# Patient Record
Sex: Male | Born: 1940
Health system: Southern US, Community
[De-identification: ages and names within clinical notes are randomized; demographics above are authoritative.]

## PROBLEM LIST (undated history)

## (undated) DIAGNOSIS — K219 Gastro-esophageal reflux disease without esophagitis: Secondary | ICD-10-CM

## (undated) DIAGNOSIS — E7800 Pure hypercholesterolemia, unspecified: Secondary | ICD-10-CM

## (undated) DIAGNOSIS — G250 Essential tremor: Secondary | ICD-10-CM

## (undated) DIAGNOSIS — I509 Heart failure, unspecified: Secondary | ICD-10-CM

## (undated) DIAGNOSIS — S82872A Displaced pilon fracture of left tibia, initial encounter for closed fracture: Secondary | ICD-10-CM

## (undated) DIAGNOSIS — R011 Cardiac murmur, unspecified: Secondary | ICD-10-CM

## (undated) DIAGNOSIS — I499 Cardiac arrhythmia, unspecified: Secondary | ICD-10-CM

## (undated) DIAGNOSIS — T7840XA Allergy, unspecified, initial encounter: Secondary | ICD-10-CM

## (undated) DIAGNOSIS — M1990 Unspecified osteoarthritis, unspecified site: Secondary | ICD-10-CM

## (undated) DIAGNOSIS — C801 Malignant (primary) neoplasm, unspecified: Secondary | ICD-10-CM

## (undated) DIAGNOSIS — I6529 Occlusion and stenosis of unspecified carotid artery: Secondary | ICD-10-CM

## (undated) DIAGNOSIS — M4802 Spinal stenosis, cervical region: Secondary | ICD-10-CM

## (undated) DIAGNOSIS — I219 Acute myocardial infarction, unspecified: Secondary | ICD-10-CM

## (undated) DIAGNOSIS — I2510 Atherosclerotic heart disease of native coronary artery without angina pectoris: Secondary | ICD-10-CM

## (undated) DIAGNOSIS — G4733 Obstructive sleep apnea (adult) (pediatric): Secondary | ICD-10-CM

## (undated) DIAGNOSIS — I350 Nonrheumatic aortic (valve) stenosis: Secondary | ICD-10-CM

## (undated) DIAGNOSIS — G4730 Sleep apnea, unspecified: Secondary | ICD-10-CM

## (undated) DIAGNOSIS — E119 Type 2 diabetes mellitus without complications: Secondary | ICD-10-CM

## (undated) DIAGNOSIS — E039 Hypothyroidism, unspecified: Secondary | ICD-10-CM

## (undated) DIAGNOSIS — K5730 Diverticulosis of large intestine without perforation or abscess without bleeding: Secondary | ICD-10-CM

## (undated) DIAGNOSIS — R519 Headache, unspecified: Secondary | ICD-10-CM

## (undated) DIAGNOSIS — N200 Calculus of kidney: Secondary | ICD-10-CM

## (undated) DIAGNOSIS — Z9289 Personal history of other medical treatment: Secondary | ICD-10-CM

## (undated) DIAGNOSIS — R51 Headache: Secondary | ICD-10-CM

## (undated) DIAGNOSIS — M109 Gout, unspecified: Secondary | ICD-10-CM

## (undated) DIAGNOSIS — Z860100 Personal history of colon polyps, unspecified: Secondary | ICD-10-CM

## (undated) DIAGNOSIS — T8859XA Other complications of anesthesia, initial encounter: Secondary | ICD-10-CM

## (undated) DIAGNOSIS — I10 Essential (primary) hypertension: Secondary | ICD-10-CM

## (undated) DIAGNOSIS — F419 Anxiety disorder, unspecified: Secondary | ICD-10-CM

## (undated) DIAGNOSIS — R0600 Dyspnea, unspecified: Secondary | ICD-10-CM

## (undated) DIAGNOSIS — J189 Pneumonia, unspecified organism: Secondary | ICD-10-CM

## (undated) DIAGNOSIS — Z87442 Personal history of urinary calculi: Secondary | ICD-10-CM

## (undated) DIAGNOSIS — Z8489 Family history of other specified conditions: Secondary | ICD-10-CM

## (undated) DIAGNOSIS — F4310 Post-traumatic stress disorder, unspecified: Secondary | ICD-10-CM

## (undated) DIAGNOSIS — F32A Depression, unspecified: Secondary | ICD-10-CM

## (undated) DIAGNOSIS — E8889 Other specified metabolic disorders: Secondary | ICD-10-CM

## (undated) DIAGNOSIS — T4145XA Adverse effect of unspecified anesthetic, initial encounter: Secondary | ICD-10-CM

## (undated) DIAGNOSIS — Z9989 Dependence on other enabling machines and devices: Secondary | ICD-10-CM

## (undated) HISTORY — DX: Spinal stenosis, cervical region: M48.02

## (undated) HISTORY — DX: Sleep apnea, unspecified: G47.30

## (undated) HISTORY — DX: Other specified metabolic disorders: E88.89

## (undated) HISTORY — DX: Obstructive sleep apnea (adult) (pediatric): G47.33

## (undated) HISTORY — PX: TONSILLECTOMY: SUR1361

## (undated) HISTORY — PX: JOINT REPLACEMENT: SHX530

## (undated) HISTORY — PX: HEMORRHOID BANDING: SHX5850

## (undated) HISTORY — DX: Allergy, unspecified, initial encounter: T78.40XA

## (undated) HISTORY — DX: Diverticulosis of large intestine without perforation or abscess without bleeding: K57.30

## (undated) HISTORY — DX: Personal history of colonic polyps: Z86.010

## (undated) HISTORY — DX: Personal history of colon polyps, unspecified: Z86.0100

## (undated) HISTORY — PX: CARDIAC VALVE REPLACEMENT: SHX585

## (undated) HISTORY — PX: COLONOSCOPY W/ POLYPECTOMY: SHX1380

## (undated) HISTORY — PX: TOE FUSION: SHX1070

## (undated) HISTORY — DX: Occlusion and stenosis of unspecified carotid artery: I65.29

## (undated) HISTORY — DX: Essential tremor: G25.0

## (undated) HISTORY — DX: Gout, unspecified: M10.9

## (undated) HISTORY — DX: Essential (primary) hypertension: I10

## (undated) HISTORY — PX: BACK SURGERY: SHX140

## (undated) HISTORY — DX: Obstructive sleep apnea (adult) (pediatric): Z99.89

---
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
## 1984-06-06 HISTORY — PX: KIDNEY STONE SURGERY: SHX686

## 1997-09-17 ENCOUNTER — Other Ambulatory Visit: Admission: RE | Admit: 1997-09-17 | Discharge: 1997-09-17 | Payer: Self-pay | Admitting: Dermatology

## 2000-06-06 HISTORY — PX: ANTERIOR FUSION CERVICAL SPINE: SUR626

## 2001-01-22 ENCOUNTER — Encounter: Payer: Self-pay | Admitting: Specialist

## 2001-01-23 ENCOUNTER — Observation Stay (HOSPITAL_COMMUNITY): Admission: RE | Admit: 2001-01-23 | Discharge: 2001-01-24 | Payer: Self-pay | Admitting: Specialist

## 2001-01-23 ENCOUNTER — Encounter (INDEPENDENT_AMBULATORY_CARE_PROVIDER_SITE_OTHER): Payer: Self-pay

## 2001-01-23 ENCOUNTER — Encounter: Payer: Self-pay | Admitting: Specialist

## 2001-01-31 ENCOUNTER — Encounter: Payer: Self-pay | Admitting: Specialist

## 2001-01-31 ENCOUNTER — Emergency Department (HOSPITAL_COMMUNITY): Admission: EM | Admit: 2001-01-31 | Discharge: 2001-01-31 | Payer: Self-pay | Admitting: Emergency Medicine

## 2001-04-09 ENCOUNTER — Encounter: Admission: RE | Admit: 2001-04-09 | Discharge: 2001-04-09 | Payer: Self-pay | Admitting: Specialist

## 2001-04-09 ENCOUNTER — Encounter: Payer: Self-pay | Admitting: Specialist

## 2001-05-04 ENCOUNTER — Encounter: Payer: Self-pay | Admitting: General Surgery

## 2001-05-04 ENCOUNTER — Inpatient Hospital Stay (HOSPITAL_COMMUNITY): Admission: EM | Admit: 2001-05-04 | Discharge: 2001-05-08 | Payer: Self-pay

## 2001-07-18 ENCOUNTER — Encounter: Payer: Self-pay | Admitting: Neurosurgery

## 2001-07-18 ENCOUNTER — Inpatient Hospital Stay (HOSPITAL_COMMUNITY): Admission: RE | Admit: 2001-07-18 | Discharge: 2001-07-19 | Payer: Self-pay | Admitting: Neurosurgery

## 2001-11-02 ENCOUNTER — Ambulatory Visit: Admission: RE | Admit: 2001-11-02 | Discharge: 2001-11-02 | Payer: Self-pay | Admitting: Internal Medicine

## 2002-01-08 ENCOUNTER — Observation Stay (HOSPITAL_COMMUNITY): Admission: RE | Admit: 2002-01-08 | Discharge: 2002-01-08 | Payer: Self-pay | Admitting: Orthopedic Surgery

## 2002-05-24 ENCOUNTER — Ambulatory Visit (HOSPITAL_BASED_OUTPATIENT_CLINIC_OR_DEPARTMENT_OTHER): Admission: RE | Admit: 2002-05-24 | Discharge: 2002-05-24 | Payer: Self-pay | Admitting: Internal Medicine

## 2002-08-08 ENCOUNTER — Ambulatory Visit (HOSPITAL_BASED_OUTPATIENT_CLINIC_OR_DEPARTMENT_OTHER): Admission: RE | Admit: 2002-08-08 | Discharge: 2002-08-08 | Payer: Self-pay | Admitting: Pulmonary Disease

## 2002-11-26 ENCOUNTER — Encounter: Payer: Self-pay | Admitting: Orthopedic Surgery

## 2002-11-27 ENCOUNTER — Encounter: Payer: Self-pay | Admitting: Orthopedic Surgery

## 2002-11-27 ENCOUNTER — Ambulatory Visit (HOSPITAL_COMMUNITY): Admission: RE | Admit: 2002-11-27 | Discharge: 2002-11-27 | Payer: Self-pay | Admitting: Orthopedic Surgery

## 2003-09-30 ENCOUNTER — Ambulatory Visit (HOSPITAL_BASED_OUTPATIENT_CLINIC_OR_DEPARTMENT_OTHER): Admission: RE | Admit: 2003-09-30 | Discharge: 2003-09-30 | Payer: Self-pay | Admitting: Orthopedic Surgery

## 2003-09-30 ENCOUNTER — Ambulatory Visit (HOSPITAL_COMMUNITY): Admission: RE | Admit: 2003-09-30 | Discharge: 2003-09-30 | Payer: Self-pay | Admitting: Orthopedic Surgery

## 2004-06-06 HISTORY — PX: POSTERIOR FUSION CERVICAL SPINE: SUR628

## 2004-06-18 ENCOUNTER — Ambulatory Visit: Payer: Self-pay | Admitting: Internal Medicine

## 2004-06-21 ENCOUNTER — Ambulatory Visit: Payer: Self-pay | Admitting: Internal Medicine

## 2005-04-08 ENCOUNTER — Ambulatory Visit: Payer: Self-pay | Admitting: Internal Medicine

## 2005-04-20 ENCOUNTER — Ambulatory Visit: Payer: Self-pay | Admitting: Internal Medicine

## 2005-08-31 ENCOUNTER — Ambulatory Visit: Payer: Self-pay | Admitting: Pulmonary Disease

## 2005-10-06 ENCOUNTER — Ambulatory Visit: Payer: Self-pay | Admitting: Internal Medicine

## 2005-11-16 ENCOUNTER — Ambulatory Visit: Payer: Self-pay | Admitting: Internal Medicine

## 2006-08-15 ENCOUNTER — Ambulatory Visit: Payer: Self-pay | Admitting: Internal Medicine

## 2008-12-22 ENCOUNTER — Ambulatory Visit: Payer: Self-pay | Admitting: Internal Medicine

## 2008-12-22 ENCOUNTER — Inpatient Hospital Stay (HOSPITAL_COMMUNITY): Admission: EM | Admit: 2008-12-22 | Discharge: 2008-12-25 | Payer: Self-pay | Admitting: Emergency Medicine

## 2008-12-22 ENCOUNTER — Ambulatory Visit: Payer: Self-pay | Admitting: Cardiology

## 2008-12-22 ENCOUNTER — Ambulatory Visit: Payer: Self-pay | Admitting: Gastroenterology

## 2008-12-22 DIAGNOSIS — Z87442 Personal history of urinary calculi: Secondary | ICD-10-CM | POA: Insufficient documentation

## 2008-12-22 DIAGNOSIS — M109 Gout, unspecified: Secondary | ICD-10-CM | POA: Insufficient documentation

## 2008-12-22 DIAGNOSIS — K5730 Diverticulosis of large intestine without perforation or abscess without bleeding: Secondary | ICD-10-CM

## 2008-12-22 DIAGNOSIS — I10 Essential (primary) hypertension: Secondary | ICD-10-CM | POA: Insufficient documentation

## 2008-12-24 ENCOUNTER — Encounter: Payer: Self-pay | Admitting: Gastroenterology

## 2008-12-25 ENCOUNTER — Encounter: Payer: Self-pay | Admitting: Gastroenterology

## 2008-12-25 ENCOUNTER — Encounter: Payer: Self-pay | Admitting: Internal Medicine

## 2008-12-30 ENCOUNTER — Encounter: Payer: Self-pay | Admitting: Gastroenterology

## 2008-12-31 ENCOUNTER — Ambulatory Visit: Payer: Self-pay | Admitting: Internal Medicine

## 2008-12-31 DIAGNOSIS — Z860100 Personal history of colon polyps, unspecified: Secondary | ICD-10-CM | POA: Insufficient documentation

## 2009-01-01 ENCOUNTER — Ambulatory Visit: Payer: Self-pay | Admitting: Internal Medicine

## 2009-01-01 LAB — CONVERTED CEMR LAB
Calcium: 9.7 mg/dL (ref 8.4–10.5)
GFR calc non Af Amer: 53.89 mL/min (ref >60)
Glucose, Bld: 121 mg/dL — ABNORMAL HIGH (ref 70–99)
Sodium: 140 meq/L (ref 135–145)

## 2009-01-02 ENCOUNTER — Ambulatory Visit: Payer: Self-pay | Admitting: Internal Medicine

## 2009-01-09 ENCOUNTER — Encounter (INDEPENDENT_AMBULATORY_CARE_PROVIDER_SITE_OTHER): Payer: Self-pay | Admitting: *Deleted

## 2009-01-09 LAB — CONVERTED CEMR LAB
HDL: 30.2 mg/dL — ABNORMAL LOW (ref >39.00)
Hgb A1c MFr Bld: 6.1 % (ref 4.6–6.5)
Microalb Creat Ratio: 13.5 mg/g (ref 0.0–30.0)
Triglycerides: 199 mg/dL — ABNORMAL HIGH (ref 0.0–149.0)
Uric Acid, Serum: 7.8 mg/dL (ref 4.0–7.8)

## 2009-04-27 ENCOUNTER — Encounter: Payer: Self-pay | Admitting: Internal Medicine

## 2009-07-08 ENCOUNTER — Ambulatory Visit: Payer: Self-pay | Admitting: Internal Medicine

## 2009-07-08 DIAGNOSIS — E119 Type 2 diabetes mellitus without complications: Secondary | ICD-10-CM

## 2009-07-08 LAB — CONVERTED CEMR LAB
HDL: 38.9 mg/dL — ABNORMAL LOW (ref >39.00)
Hgb A1c MFr Bld: 6.6 % — ABNORMAL HIGH (ref 4.6–6.5)

## 2009-07-11 LAB — CONVERTED CEMR LAB
Creatinine, Ser: 1.4 mg/dL (ref 0.4–1.5)
Potassium: 4.5 meq/L (ref 3.5–5.1)

## 2009-07-13 ENCOUNTER — Telehealth (INDEPENDENT_AMBULATORY_CARE_PROVIDER_SITE_OTHER): Payer: Self-pay | Admitting: *Deleted

## 2010-07-06 NOTE — Progress Notes (Signed)
Summary: Lab Results  Phone Note Outgoing Call Call back at [facility] Phone [phone]   Call placed by: Shonna Chock,  July 13, 2009 3:50 PM Call placed to: Patient Summary of Call: Left message on machine with the information below  Good, kidney function is stable & normal (creatinine was 1.4 in 12/2008 also). Hopp Patient to call if any questions or concerns. Copy of labs mailed Chrae Malloy  July 13, 2009 3:51 PM

## 2010-07-06 NOTE — Assessment & Plan Note (Signed)
Summary: irregular heartbeats and dehydrated//lh   Vital Signs:  Patient profile:   70 year old male Weight:      262 pounds Temp:     98.9 degrees F oral Resp:     15 per minute BP sitting:   162 / 89  (left arm) Cuff size:   large  Vitals Entered By: Shonna Chock (July 08, 2009 4:45 PM) CC: Irregular Heartbeats and ? dehydrated. Patient began to cuss and said he is not doing an EKG, this entire visit is (cuss word). Patient was upset that Kidney function and potassium was not checked and I discuss the reason was it was not order based on labs form 12/2008 Comments REVIEWED MED LIST, PATIENT AGREED DOSE AND INSTRUCTION CORRECT    CC:  Irregular Heartbeats and ? dehydrated. Patient began to cuss and said he is not doing an EKG and this entire visit is (cuss word). Patient was upset that Kidney function and potassium was not checked and I discuss the reason was it was not order based on labs form 12/2008.  History of Present Illness: He questions dehydration due to difficult venous access despite > 2 liters of fluid/ day & edema. His major concern is recurrence of renal insufficiency with hyperkalemia which prompted last hospitalization. Labs drawn were based on 01/02/2009 abnormal values. He has been  seen @ [facility]  X 3  since 12/2008;  labs done pre Endo 11/2Esophagus, gastritis, duodenitis. He is transferring to [facility].Labs reviewed & risks discussed; A1c now in Diabetes range. See BP; BP 140/80 on average @ home.  Allergies: 1)  ! Valium 2)  ! Zoloft  Past History:  Past Surgical History: Toe surgery X 2 (1610,9604); Open retrival renal calculus on L; Cervical fusion,post Cervical laminectomy,ant Tonsillectomy Colon polypectomy Endoscopy 04/2009:Barrett's, Gastritis, Duodenitis  Review of Systems General:  Complains of fatigue and sleep disorder. ENT:  Denies difficulty swallowing and hoarseness. CV:  Complains of swelling of feet and swelling of hands; denies bluish  discoloration of lips or nails, chest pain or discomfort, difficulty breathing at night, difficulty breathing while lying down, leg cramps with exertion, lightheadness, near fainting, and shortness of breath with exertion. Resp:  Denies excessive snoring, hypersomnolence, and morning headaches; CPAP controls Sleep Apnea. GI:  Denies diarrhea. GU:  Denies discharge, dysuria, hematuria, incontinence, urinary frequency, and urinary hesitancy; Nocturia X 1. MS:  Complains of joint pain; denies joint redness and joint swelling; No recent gout ; Allopurinol restarted 1 month ago . pain in toes & hands, knees , L hip. Derm:  Denies poor wound healing. Neuro:  Complains of numbness and tingling; N& T and burning in feet. Endo:  Denies excessive hunger, excessive thirst, and excessive urination.  Physical Exam  General:  in no acute distress; alert,appropriate and cooperative throughout examination Neck:  No deformities, masses, or tenderness noted. Lungs:  Normal respiratory effort, chest expands symmetrically. Lungs are clear to auscultation, no crackles or wheezes. Heart:  normal rate, regular rhythm, no gallop, no rub, no JVD, no HJR, and grade1.5  /6 basialr systolic murmur.  Carotid bruits vs radiation Abdomen:  Bowel sounds positive,abdomen soft and non-tender without masses, organomegaly or hernias noted. Protuberant  Pulses:  R and L carotid,radial,dorsalis pedis and posterior tibial pulses are full and equal bilaterally Extremities:  No clubbing, cyanosis, edema, or deformity noted . Minor toenail fungal changes Neurologic:  alert & oriented X3 and sensation intact to light touch over feet.   Skin:  Intact without  suspicious lesions or rashes Psych:  Frustrated that renal panel had not been done.   Impression & Recommendations:  Problem # 1:  HYPERTENSION (ICD-401.9)  His updated medication list for this problem includes:    Furosemide 40 Mg Tabs (Furosemide) .[name]... 1 once daily     Metoprolol Tartrate 50 Mg Tabs (Metoprolol tartrate) .[name]... 1/2 -1 two times a day to maintain bp < 135/85 on average    Amlodipine Besylate 5 Mg Tabs (Amlodipine besylate) .[name]... 1/2 -1 once daily to maaintain bp < 135/85 on average  Problem # 2:  DIABETES MELLITUS (ICD-250.00)  Problem # 3:  GOUT (ICD-274.9) PMH of His updated medication list for this problem includes:    Allopurinol 300 Mg Tabs (Allopurinol) .[name]... 1 by mouth once daily  Problem # 4:  RENAL FAILURE (ICD-586) PMH of  Complete Medication List: 1)  Furosemide 40 Mg Tabs (Furosemide) .[name].. 1 once daily 2)  Metoprolol Tartrate 50 Mg Tabs (Metoprolol tartrate) .... 1/2 -1 two times a day to maintain bp < 135/85 on average 3)  Amlodipine Besylate 5 Mg Tabs (Amlodipine besylate) .... 1/2 -1 once daily to maaintain bp < 135/85 on average 4)  Omeprazole 20 Mg Tbec (Omeprazole) .[name].. 1 by mouth once daily 5)  Allopurinol 300 Mg Tabs (Allopurinol) .[name].. 1 by mouth once daily 6)  Dicyclomine Hcl 10 Mg Caps (Dicyclomine hcl) .... 2 by mouth qid as needed 7)  Folic Acid 800 Mcg Tabs (Folic acid) .[name].. 1 by mouth once daily 8)  Vitamin C 1000 Mg Tabs (Ascorbic acid) .[name].. 1 by mouth once daily 9)  Omega 3 Purified Fish Oil  .[name].. 1 tsp daily  Patient Instructions: 1)  Follow "Rules of 40": LESS THAN 40 grams of sugar / day from foods & drinks with High Fructose Corn Syrup as #1, 2 or #3 on label. 40 oz of water / day. Exercise 40 min 3 X/week. Aim for 40 inches. 2)  See your eye doctor yearly to check for diabetic eye damage. 3)  Check your feet each night for sore areas, calluses or signs of infection. 4)  Check your Blood Pressure regularly. If it is above:140/90 ON AVERAGE  you should  increase Amlodipine to two times a day  5)  Please schedule a follow-up appointment after  3 months of nutrition changes as above: 6)  BUN,creat, K+, uric acid,Lipid Panel,  7)  HbgA1C.

## 2010-07-06 NOTE — Procedures (Signed)
Summary: EGD/[facility]  EGD/[facility]   Imported By: Lanelle Bal 07/14/2009 09:01:12  _____________________________________________________________________  External Attachment:    Type:   Image     Comment:   External Document

## 2010-09-12 LAB — BASIC METABOLIC PANEL
BUN: 21 mg/dL (ref 6–23)
BUN: 27 mg/dL — ABNORMAL HIGH (ref 6–23)
BUN: 52 mg/dL — ABNORMAL HIGH (ref 6–23)
CO2: 13 mEq/L — ABNORMAL LOW (ref 19–32)
CO2: 16 mEq/L — ABNORMAL LOW (ref 19–32)
CO2: 19 mEq/L (ref 19–32)
CO2: 20 mEq/L (ref 19–32)
CO2: 27 mEq/L (ref 19–32)
CO2: 28 mEq/L (ref 19–32)
Calcium: 8.4 mg/dL (ref 8.4–10.5)
Calcium: 9.2 mg/dL (ref 8.4–10.5)
Calcium: 9.4 mg/dL (ref 8.4–10.5)
Calcium: 9.7 mg/dL (ref 8.4–10.5)
Chloride: 105 mEq/L (ref 96–112)
Chloride: 107 mEq/L (ref 96–112)
Chloride: 115 mEq/L — ABNORMAL HIGH (ref 96–112)
Chloride: 117 mEq/L — ABNORMAL HIGH (ref 96–112)
Creatinine, Ser: 1.56 mg/dL — ABNORMAL HIGH (ref 0.4–1.5)
Creatinine, Ser: 1.66 mg/dL — ABNORMAL HIGH (ref 0.4–1.5)
Creatinine, Ser: 2.56 mg/dL — ABNORMAL HIGH (ref 0.4–1.5)
GFR calc Af Amer: 27 mL/min — ABNORMAL LOW (ref >60)
GFR calc Af Amer: 39 mL/min — ABNORMAL LOW (ref >60)
Glucose, Bld: 126 mg/dL — ABNORMAL HIGH (ref 70–99)
Glucose, Bld: 135 mg/dL — ABNORMAL HIGH (ref 70–99)
Glucose, Bld: 140 mg/dL — ABNORMAL HIGH (ref 70–99)
Glucose, Bld: 146 mg/dL — ABNORMAL HIGH (ref 70–99)
Glucose, Bld: 94 mg/dL (ref 70–99)
Potassium: 4.7 mEq/L (ref 3.5–5.1)
Potassium: 4.8 mEq/L (ref 3.5–5.1)
Potassium: 5.1 mEq/L (ref 3.5–5.1)
Sodium: 136 mEq/L (ref 135–145)
Sodium: 137 mEq/L (ref 135–145)
Sodium: 138 mEq/L (ref 135–145)
Sodium: 140 mEq/L (ref 135–145)

## 2010-09-12 LAB — COMPREHENSIVE METABOLIC PANEL
AST: 17 U/L (ref 0–37)
Albumin: 3.7 g/dL (ref 3.5–5.2)
Alkaline Phosphatase: 31 U/L — ABNORMAL LOW (ref 39–117)
Alkaline Phosphatase: 37 U/L — ABNORMAL LOW (ref 39–117)
BUN: 21 mg/dL (ref 6–23)
CO2: 29 mEq/L (ref 19–32)
Chloride: 119 mEq/L — ABNORMAL HIGH (ref 96–112)
GFR calc Af Amer: 23 mL/min — ABNORMAL LOW (ref >60)
GFR calc non Af Amer: 42 mL/min — ABNORMAL LOW (ref >60)
Glucose, Bld: 106 mg/dL — ABNORMAL HIGH (ref 70–99)
Potassium: 3.7 mEq/L (ref 3.5–5.1)
Potassium: >7.5 mEq/L (ref 3.5–5.1)
Total Bilirubin: 1 mg/dL (ref 0.3–1.2)
Total Protein: 6.4 g/dL (ref 6.0–8.3)
Total Protein: 7 g/dL (ref 6.0–8.3)

## 2010-09-12 LAB — IRON AND TIBC
Iron: 54 ug/dL (ref 42–135)
UIBC: 181 ug/dL

## 2010-09-12 LAB — URINALYSIS, ROUTINE W REFLEX MICROSCOPIC
Bilirubin Urine: NEGATIVE
Ketones, ur: NEGATIVE mg/dL
Nitrite: NEGATIVE
Protein, ur: NEGATIVE mg/dL
Specific Gravity, Urine: 1.008 (ref 1.005–1.030)
Urobilinogen, UA: 0.2 mg/dL (ref 0.0–1.0)

## 2010-09-12 LAB — FECAL LACTOFERRIN, QUANT: Fecal Lactoferrin: NEGATIVE

## 2010-09-12 LAB — URINE CULTURE
Colony Count: NO GROWTH
Culture: NO GROWTH

## 2010-09-12 LAB — CBC
HCT: 32.4 % — ABNORMAL LOW (ref 39.0–52.0)
Hemoglobin: 11.1 g/dL — ABNORMAL LOW (ref 13.0–17.0)
MCHC: 34.1 g/dL (ref 30.0–36.0)
MCV: 92.2 fL (ref 78.0–100.0)
MCV: 93 fL (ref 78.0–100.0)
Platelets: 142 10*3/uL — ABNORMAL LOW (ref 150–400)
RBC: 3.48 MIL/uL — ABNORMAL LOW (ref 4.22–5.81)
RBC: 3.56 MIL/uL — ABNORMAL LOW (ref 4.22–5.81)
WBC: 7.8 10*3/uL (ref 4.0–10.5)
WBC: 9.2 10*3/uL (ref 4.0–10.5)

## 2010-09-12 LAB — CARDIAC PANEL(CRET KIN+CKTOT+MB+TROPI)
CK, MB: 4.4 ng/mL — ABNORMAL HIGH (ref 0.3–4.0)
CK, MB: 4.5 ng/mL — ABNORMAL HIGH (ref 0.3–4.0)
Relative Index: 1.2 (ref 0.0–2.5)
Total CK: 384 U/L — ABNORMAL HIGH (ref 7–232)

## 2010-09-12 LAB — URINE MICROSCOPIC-ADD ON

## 2010-09-12 LAB — POCT CARDIAC MARKERS
CKMB, poc: 1.8 ng/mL (ref 1.0–8.0)
Myoglobin, poc: 492 ng/mL (ref 12–200)
Myoglobin, poc: >500 ng/mL (ref 12–200)
Troponin i, poc: <0.05 ng/mL (ref 0.00–0.09)

## 2010-09-12 LAB — GIARDIA/CRYPTOSPORIDIUM SCREEN(EIA)

## 2010-09-12 LAB — GLUCOSE, CAPILLARY: Glucose-Capillary: 101 mg/dL — ABNORMAL HIGH (ref 70–99)

## 2010-09-12 LAB — DIFFERENTIAL
Basophils Absolute: 0 10*3/uL (ref 0.0–0.1)
Basophils Relative: 0 % (ref 0–1)
Eosinophils Relative: 1 % (ref 0–5)
Lymphocytes Relative: 17 % (ref 12–46)
Monocytes Absolute: 0.7 10*3/uL (ref 0.1–1.0)
Monocytes Relative: 9 % (ref 3–12)

## 2010-09-12 LAB — CLOSTRIDIUM DIFFICILE EIA: C difficile Toxins A+B, EIA: NEGATIVE

## 2010-09-12 LAB — AMYLASE: Amylase: 79 U/L (ref 27–131)

## 2010-09-12 LAB — FERRITIN: Ferritin: 731 ng/mL — ABNORMAL HIGH (ref 22–322)

## 2010-09-12 LAB — CK TOTAL AND CKMB (NOT AT ARMC): Total CK: 267 U/L — ABNORMAL HIGH (ref 7–232)

## 2010-09-12 LAB — RETICULOCYTES: RBC.: 3.69 MIL/uL — ABNORMAL LOW (ref 4.22–5.81)

## 2010-09-12 LAB — POTASSIUM: Potassium: >7.5 mEq/L (ref 3.5–5.1)

## 2010-10-19 NOTE — Discharge Summary (Signed)
Troy Banks, Troy Banks            ACCOUNT NO.:  [id number]   MEDICAL RECORD NO.:  [id number]          PATIENT TYPE:  INP   LOCATION:  4739                         FACILITY:  [facility]   PHYSICIAN:  Rosalyn Gess. Norins, MD  DATE OF BIRTH:  04-19-1941   DATE OF ADMISSION:  12/22/2008  DATE OF DISCHARGE:  12/25/2008                               DISCHARGE SUMMARY   ADMITTING DIAGNOSES:  1. Acute abdominal pain.  2. Chest pain.   DISCHARGE DIAGNOSES:  1. Diarrhea of unknown etiology.  2. Chest pain, resolved.  3. Acute renal failure, resolved.  4. Acute severe hyperglycemia, resolved.  5. Abdominal bloating and distention with no working diagnosis.   CONSULTANTS:  Barbette Hair. Arlyce Dice, MD, [facility] for the GI Service.   PROCEDURES:  CT scan of the abdomen which showed no acute abnormal  findings except for scattered diverticulosis.  He did have  nonobstructing right renal calculi.  CT of pelvis was negative except  for diverticulosis.  Acute abdominal series at the time of admission  with a bronchitic changes.  Nonobstructing calculi noted.  No acute  abdominal findings noted.  Colonoscopy which revealed that the patient  have a polyp that was removed.  Pathology is pending.   HISTORY OF PRESENT ILLNESS:  The patient is a very pleasant 70 year old  gentleman who has been followed by Dr. Marga Melnick, although not seen  for several years.  He has been followed by the [facility].  He was  evaluated and being treated with lisinopril at the [facility] for over  a year.  The patient presented to Dr. Frederik Pear office complaining of  loose stools and lower abdominal pain for 3 weeks.  He had early satiety  and his weight had dropped 20 pounds and actually 40 pounds over the  last several months.  He had dull mid abdominal pain and discomfort,  sharp pain in bilateral lower quadrants.  He had food sensitivities.  Because of the patient's abdominal pain and discomfort, he was admitted  to  hospital.  Of note, he denied any fever, dysuria, jaundice, or dark  urine and his abdominal pain was relieved with antacids.   The patient did report exertional chest pain, shortness of breath,  palpitations, dizziness, and lightheadedness.  He denied any resting  chest pain, had no diaphoresis, or syncope.  Pain was in the substernal  region and was relieved with antacids.   PAST MEDICAL HISTORY:  1. ADHD with manic depression.  2. Diverticulosis.  3. Gout.  4. Hypertension.  5. Nephrolithiasis.   PAST SURGICAL HISTORY:  1. Toe surgery x2.  2. Open retrieval of renal calculus on the left.  3. Cervical fusion in the past as well as an anterior cervical      laminectomy and tonsillectomy.   MEDICATIONS AT ADMISSION:  1. Spironolactone 25 mg b.i.d.  2. Cardura 8 mg daily.  3. Benazepril and hydrochlorothiazide b.i.d.  4. Allopurinol 300 mg daily.  5. Lopressor 50 mg daily.   HOSPITAL COURSE:  1. Renal.  The patient was admitted and had acute renal failure with a  creatinine of 3.47.  The patient also had acute hyperkalemia with a      potassium of 7.5.  He was treated with Kayexalate, calcium, and      insulin.  After multiple treatments, the patient's potassium did      return down to a normal range.  He never demonstrated any      significant EKG abnormalities with this.  With adequate hydration,      the patient's renal function did return to a more reasonable level,      although with chronic renal insufficiency with a creatinine of      1.64.  This has been a stable value.  Do not have prior outpatient      laboratory for comparison.  2. Diarrhea.  The patient with a frequent stooling and possible cause      of his dehydration and acute renal failure.  Stool cultures were      pending at time of discharge.  Stool was negative for C. diff x2.      The patient was seen in consultation by the GI Service.  He did      come to colonoscopy, which was unremarkable.  It was  thought that      he may have Giardia or some other parasitic causes diarrhea and it      was recommended that he did take Flagyl 500 mg t.i.d. for a full 14-      day course.  The patient does continue to have abdominal bloating,      but there are no findings on CT as noted.  The patient will see Dr.      Alwyn Ren and follow up who can also arrange for follow up with the GI      Service as an outpatient if his diarrhea does not improve.  3. Cardiovascular.  The patient had cardiac enzymes that were negative      x3.  He had a 2-D echo that was ordered at the time of discharge.      The results were still pending.  If the patient has wall motion      abnormality or other signs of ischemic changes, he will need to      have followup, which may be done as an outpatient.  He has been      chest painfree since being in hospital.  Dr. Alwyn Ren will see to      arranging for follow up with Cardiology as an outpatient if      indicated.  4. Hypertension.  The patient's medicines have all been held.  His      blood pressures have been well controlled in the 120 range.  At      this time, we will not restart blood pressure medications.  We will      have the patient continue to monitor his blood pressure at home.      He is to bring these readings to Dr. Alwyn Ren for evaluation as an      outpatient and to determine if he is going to need additional      antihypertensive medications.  We would avoid ACE inhibitors given      his recent problem with renal insufficiency and his ongoing renal      insufficiency.  5. Psychiatric.  The patient with history of attention deficit      hyperactivity disorder and depression.  Question of bipolar      disease.  He was not taking any psychiatric medications at time of      admission.  He does seem stable at this time.  His wife vouches for      the fact that he seems to be doing well and stable.  We will defer      starting any medications at this time to Dr.  Frederik Pear evaluation as      an outpatient.  Dr. Alwyn Ren will also determine if the patient need      outpatient psychiatric evaluation.   With the patient's diarrhea having slowed stop, with his renal function  having markedly improved, with his potassium being corrected, with a  negative colonoscopy, with negative cardiac enzymes, the patient was  thought to be stable and ready for discharge home.   DISCHARGE EXAMINATION:  VITAL SIGNS:  Temperature was 97.7, blood  pressure 126/65, heart rate 62, respirations 18.  GENERAL APPEARANCE:  This is a well-nourished, well-developed, Caucasian  gentleman with a protuberant abdomen in no acute distress.  HEENT:  Unremarkable.  CHEST:  The patient is moving air well with no rales, wheezes, or  rhonchi.  CARDIOVASCULAR:  The patient's precordium is quiet.  He had a regular  rate and rhythm.  ABDOMEN:  Distended.  He had positive bowel sounds.  He had no  tenderness.  EXTREMITIES:  Without clubbing, cyanosis or edema.   FINAL LABORATORY:  Stool for Giardia and Cryptosporidium was negative.  Stool culture with no growth at this time.  Final comprehensive panel  from the day of discharge with a sodium of 140, potassium 3.7, chloride  102, CO2 of 29, BUN of 21, creatinine 1.64, glucose 106.  SGOT and SGPT  were normal.  Alkaline phosphatase was normal.  Fecal lactoferrin was  negative.  Anemia panel with a retic count of 1.2, absolute reticulocyte  count 44.3, total iron normal at 54, iron percent saturation normal at  23, B12 normal at 365, ferritin was elevated at 731, amylase at  admission was normal at 79.  Cardiac enzymes were negative x3.   DISPOSITION:  The patient was discharged home.  He will see Dr. Alwyn Ren  in approximately 7-10 days.   DISCHARGE MEDICATIONS:  The patient will be discharged on no  medications.  At this time, discontinuing his spironolactone,  Cardura,  benazepril and hydrochlorothiazide, allopurinol, and Lopressor.   He will  take Flagyl 500 mg t.i.d. for an additional 10 days per GI  recommendations.   The patient was carefully instructed that if he should having continued  pain, problems, or changes in his condition, he should call for  immediate evaluation.  Dr. Alwyn Ren we will be able to follow up on the  patient's 2-D echo with appropriate recommendations for follow up at  that time.   The patient's condition at time of discharge dictation is medically  stable.      Rosalyn Gess Norins, MD  Electronically Signed     MEN/MEDQ  D:  12/25/2008  T:  12/26/2008  Job:  [id number]   cc:   Barbette Hair. Arlyce Dice, MD,FACG  Luis Abed, MD, [facility]  Titus Dubin. Alwyn Ren, MD,FACP,FCCP

## 2010-10-19 NOTE — Consult Note (Signed)
[name], [name] NO.:  [id number]   MEDICAL RECORD NO.:  [id number]          PATIENT TYPE:  EMS   LOCATION:  MAJO                         FACILITY:  [facility]   PHYSICIAN:  Luis Abed, MD, FACCDATE OF BIRTH:  02/02/1941   DATE OF CONSULTATION:  DATE OF DISCHARGE:                                 CONSULTATION   HISTORY AND PHYSICAL:  Mr. Troy Banks was seen by Dr. Alwyn Ren in his office  today.  Dr. Alwyn Ren was concerned about the patient's abdominal pain but  also his chest pain.  He sent the patient to [facility] for admission  and asked for Cardiology to consult.  The patient is currently stable in  the emergency room.  He says that he has had abdominal discomfort for 3  or 4 weeks and watery diarrhea for that time.  He is overweight in  general, but says that he has lost at least 20 pounds over this period  of time.  Along with this he feels quite weak.  He says that if he tries  to ambulate he actually feels fatigued all over.  In addition, at the  end of the interview with him he said that he thought that this was all  due to enormous stress.  He feels that he is severely depressed.  In  the remaining short interview I could not ascertain what the stress  points are and the basis of his depression.   His chest discomfort is in his left breast and radiates between his  shoulder blades.  Sometimes it feels like a sharp blow to the back.  The discomfort resolves with rest.  He does not take nitroglycerin for  it.  He has no prior documented coronary disease.  Sometimes he feels  this discomfort also at rest with stress.  There is no diaphoresis with  the discomfort but there is significant fatigue.  He has not had syncope  or presyncope.  The patient does have a history of hypertension.  There  is no known diabetes.  There is a family history of coronary disease  with the patient's father dying at age 41 with a history of an MI.   PAST MEDICAL HISTORY:   ALLERGIES:  The patient has had ATAXIA WITH  VALIUM in the past, question of ATAXIA WITH ZOLOFT.  A question of  ATAXIA WITH WELLBUTRIN, and HE HAS STROKE LIKE SYMPTOMS WITH ALL OF  THESE.  He also will not use OTHER BENZODIAZEPINES because he is worried  about he is worried about possible side effects.   MEDICATIONS:  1. Doxazosin 8 mg at night.  2. Allopurinol 300.  3. Benazepril 40 b.i.d.  4. Spironolactone 25 b.i.d.  5. Metoprolol 50 mg daily.   OTHER MEDICAL PROBLEMS:  See the complete list below.   SOCIAL HISTORY:  The patient lives in [place] with a significant  other.  He has occasional alcohol.  He has two children.  He quit  smoking in 1992.  He is a retired a Economist.   REVIEW OF SYSTEMS:  The patient mentioned that he has had some  chills.  He has not had any documented fevers.  His vision has been blurred.  He  has not been bothered by significant headaches.  He noticed a vague rash  on his chest today although there is nothing obvious on the physical  exam.  As mentioned, he has had chest pain and shortness of breath.  He  does use C-Pap.  He is not having any significant GU symptoms that we  are aware.  As mentioned, he says that he is under enormous stress and  that he is significantly depressed.  He is not having any major  musculoskeletal complaints although he feels weak when he walks.  He has  GERD.  He also mentions that he is having the diarrhea as outlined in  the HPI.  All other systems are reviewed and are negative.   PHYSICAL EXAM:  VITAL SIGNS:  The patient he is overweight.  Blood  pressure is 123/50 with a rate of 49, respirations 16, and O2 sat is  100% on room air.  GENERAL:  The patient has a mild diffuse tremor.  He is in no acute  distress.  He does appear depressed.  His he is oriented to person, time  and place.  HEENT:  Reveals no xanthelasma.  He has normal extraocular motion.  NECK:  His neck is supple.  There is no obvious  jugular venous  distention.  The patient does have a radiated murmur to the neck  bilaterally.  LUNGS:  Lung exam reveals that his lung sounds are  distant.  I do not hear any rales.  CARDIAC:  Exam reveals that his heart sounds also are somewhat distant.  There is a harsh crescendo-decrescendo systolic murmur.  The second  heart sound is heard but is not split.  I do not hear any AI.  SKIN:  There are no obvious skin rashes.  ABDOMEN:  The abdomen is mildly diffusely tender.  This is most marked  below the umbilicus centrally.  He does have bowel sounds.  RECTAL:  Rectal exam was deferred by me at this time.  EXTREMITIES:  The patient has no obvious edema.  There are no obvious  joint effusions.  NEUROLOGICALLY:  He is grossly intact.  PSYCH:  As mentioned from the psychiatric viewpoint he appears to be  depressed.   LABORATORY DATA:  EKG reveals sinus bradycardia.  There is also first-  degree AV block.  There is no peaking of the T-waves.  There is no  definite second-degree or third-degree AV block.  Chest x-ray as part of  his three-way abdomen revealed no acute abnormality.  There is no  obvious increase in the cardiac silhouette.  There is no obvious heart  failure.  He did have atherosclerotic changes in the aortic arch.  There  were bronchitic changes but there were no effusions.  There is a  nonobstructing calculus of the lower pole of the right kidney.  Hemoglobin is 11.9, white count of 7800, platelets 142,000. Surprising  chemistries are noted and they are being repeated STAT by the emergency  room physician.  BUN is 65 with a creatinine of 3.27 and a potassium  that is at first recorded possibly as greater than 7.5.  This is being  assessed immediately through the emergency room physician.  As noted the  patient does not have AV block or peak T-waves.  Liver function studies  do not show any marked abnormalities and his albumin is in the low  normal range.  CPK-MB is  1.70 with a troponin of less than 0.05 on a  point of care marker.   PROBLEMS:  1. Bipolar disorder with ADHD and manic depressive history.  2. Diverticulosis.  3. Gout.  4. History of hypertension.  5. Nephrolithiasis.  6. History of a cervical fusion.  7. History of familial tremor.  8. History of a murmur.  9. Current situation with severe stress and depression as described by      the patient.  10.Exertional shortness of breath and chest pressure.  We certainly      need to rule out coronary disease.  At this point, there is no      evidence of an acute MI.  11.Abdominal pain and watery diarrhea and 20 pounds weight loss.  The      etiology of this is not clear to me and will need further workup.  12.Potassium 7.5 per the ER physician and this is being repeated STAT.  13.Renal insufficiency with a BUN of 65 and creatinine 3.27.  14.Systolic murmur.  This murmur is difficult to here.  However, I do      believe that her radiates to the neck bilaterally.  The carotid      upstroke may be slightly shattered.  This could possibly be      significant aortic stenosis.   PLAN:  At this point we will need to evaluate the patient's cardiac  status further.  However, this does not appear to be his primary problem  at this time.  The emergency room doctor is communicating with the  patient's primary care  team to further workup rapidly the potassium and renal dysfunction.  From the cardiac viewpoint, I recommend monitoring.  Further cardiac  enzymes will be checked.  Two-dimensional echo can be ordered for  tomorrow.  We will follow him.  He also needs thyroid functions which  will be done tonight.      Luis Abed, MD, [facility]  Electronically Signed     JDK/MEDQ  D:  12/22/2008  T:  12/22/2008  Job:  [id number]   cc:   Titus Dubin. Alwyn Ren, MD,FACP,FCCP

## 2010-10-22 NOTE — Consult Note (Signed)
[facility]  Patient:    VED, MARTOS Visit Number: [id number] MRN: [id number]          Service Type: SUR Location: 1E 0101 01 Attending Physician:  Lubertha South Dictated by:   Marlan Palau, M.D. Proc. Date: 01/31/01 Adm. Date:  01/31/2001 Disc. Date: 01/31/2001   CC:         [facility]  Kerrin Champagne, M.D.   Consultation Report  HISTORY OF PRESENT ILLNESS:  The patient is a 70 year old, left-handed, white male born 1940/08/05, with a history of recent cervical spine surgery. This patient was sent to the [facility] for an evaluation of progressive gait disturbance that began three or four days prior to this evaluation.  The patient had cervical spine surgery at the C4-C5, C5-C6 levels eight days ago and was placed on Valium, taking 5 mg p.o. q.6h. p.r.n.  The patient however, began to take the Valium on a scheduled basis taking 20 mg a day.  The patient began slowly feeling more and more imbalanced, sleepy, decreased concentration ability, poor memory.  The patient fell x 2, felt drunk, staggery and went up and was staggery either way, not just to the left or just to the right.  The patient denied any new numbness or weakness on the face, arms or legs.  He denied any visual field changes, slurred speech problems, swallowing, blacking out spells, dizziness.  Due to the ongoing gait disturbance, this patient was sent to the [facility] for an evaluation.  The MRI scan of the brain was performed showing no evidence of brain or brain stem infarct, no cerebellar infarct noted.  MRI scan of the cervical spine was unremarkable with postsurgical changes, no cord compression hemorrhage around the cord.  Neurology was asked to see this patient for further evaluation.  PAST MEDICAL HISTORY: 1. History of progressive gait disturbance as above, likely secondary to    Valium toxicity. 2. Status post  cervical spine surgery eight days ago at C4-C5, C5-C6 levels. 3. History of hypertension. 4. History of right great toe joint surgery. 5. History of renal calculi status post surgery. 6. History of testicular torsion. 7. History of hernia surgery. 8. History of hypoglycemia. 9. History of gout.  CURRENT MEDICATIONS: 1. Captopril 25 mg b.i.d. 2. Cardura 4 mg q.h.s. 3. Celexa 200 mg daily. 4. Wellbutrin 150 mg b.i.d. 5. Valium 5 mg q.6h.  ALLERGIES:  NO KNOWN DRUG ALLERGIES.  SOCIAL HISTORY:  Does not smoke, drinks alcohol on occasion.  The patient lives in the [place] area and has two children who are alive and well.  FAMILY MEDICAL HISTORY:  Notable for family history of diabetes and bipolar disorder, heart disease.  REVIEW OF SYSTEMS:  No fevers or chills.  The patient denies headache, visual field changes, no shortness of breath, chest pain, slurred speech, nausea, vomiting, bowel or bladder control problems.  No blackout spells.  PHYSICAL EXAMINATION:  VITAL SIGNS:  Blood pressure 164/93, heart rate 58, respiratory rate 14, temperature afebrile.  GENERAL:  This patient is a minimally obese, white male who is alert and cooperative at the time of examination.  HEENT:  Head is atraumatic.  Eyes, pupils equal round and reactive to light. Disk are flat bilaterally.  NECK:  Supple, no carotid bruits are noted.  RESPIRATORY:  Clear.  CARDIOVASCULAR:  Reveals a regular rate and rhythm with no obvious murmurs or rubs noted.  EXTREMITIES:  Without significant edema.  NEUROLOGIC:  Cranial nerves as above.  Facial symmetry is present.  The patient has good sensation of face to pinprick and soft touch bilaterally. The patient has full visual fields, normal speech pattern, no aphasia.  Good strength of the facial muscles and the muscles to head shrug and shoulder shrug bilaterally.  Motor testing reveals what appears to be 5/5 strength on all fours and good symmetric  motor tone is noted throughout.  Sensory testing reveals some decrease of the lateral forearm on the right arm.  The left otherwise pinprick sensation is symmetric throughout.  The operatory sensation is symmetric throughout.  Finger-nose-finger and toe-to-finger is symmetric bilaterally.  The patient was not ambulating.  The patient has good symmetric reflexes, toes are downgoing bilaterally.  No pronator drift is seen.  LABORATORY:  Values are pending with exception of a CBC with a white count of 7.0, hemoglobin 14.0, hematocrit 41.3, MCV 85.6, platelets 220,000.  Chemistry panel was pending at this time.  Again, MRI scan of the brain is normal, no stroke was noted, normal flow to right vertebral artery was seen. Postoperative changes in the right C4-C5, C5-C6 levels.  No evidence of spinal cord compression was seen.  IMPRESSION: 1. History of progressive gait disturbance, drowsiness, decreased memory and    concentration, likely secondary to Valium toxicity. 2. Status post cervical spine surgery as above.  This patient is likely suffering from toxicity from Valium.  The Valium itself is metabolized quickly, but the metabolites Valium have a very long half life and may last over a week in the blood stream once the medication is stopped. The patient likely will return back to his normal state once the diazepam is stopped.  The patient may do better on Robaxin which does not seem to cause any drowsiness whatsoever.  PLAN: 1. The patient is to stop diazepam. 2. May discharge back to home today.  The patient will follow up with    Dr. Otelia Sergeant for routine postoperative treatment.  Will await return of all    blood work studies prior to discharge home. Dictated by:   Marlan Palau, M.D. Attending Physician:  Lubertha South DD:  01/31/01 TD:  01/31/01 Job: [id number]

## 2010-10-22 NOTE — H&P (Signed)
[facility]  Patient:    Troy Banks, Troy Banks. Visit Number: [id number] MRN: [id number]          Service Type: Attending:  Kerrin Champagne, M.D. Dictated by:   Alexzandrew L. Perkins, P.A.-C. Adm. Date:  01/31/01   CC:         Marlan Palau, M.D.   History and Physical  DATE OF BIRTH:  Apr 17, 1941  CHIEF COMPLAINT:  Intractable postoperative cervical pain with ataxia and gait disturbance.  HISTORY OF PRESENT ILLNESS:  The patient is a 70 year old male who has recently undergone a right-sided cervical foraminotomy at C4-5 and C5-6 levels back on August 20.  He was seen, treated, and released from [facility].  He has been having intractable postoperative pain, and was noted to have some difficulty with ambulation.  Due to the increasing pain and also difficulty with his balance and gait, he is brought in and is evaluated by Dr. Sharolyn Douglas in the office today.  He was also seen by Dr. Vira Browns at the same time.  Due to the increasing difficulty and concern with the ataxia which is found on exam and his gait disturbance, it is felt he would best be served by admitting him to the hospital for workup.  Dr. Otelia Sergeant spoke with Dr. Lesia Sago over the phone.  Dr. Anne Hahn will be consulted for a neurology consult. We have decided to admit the patient for workup, undergo MRI and labs.  ALLERGIES:  No known drug allergies.  CURRENT MEDICATIONS: 1. Valium. 2. Percocet. 3. Keflex. 4. Indomethacin. 5. Robaxin. 6. Captopril. 7. Cardura. 8. Wellbutrin. 9. Celexa.  PAST MEDICAL HISTORY: 1. Hypertension. 2. Gouty arthritis.  PAST SURGICAL HISTORY: 1. Recent right cervical foraminotomy at C4-5 and C5-6. 2. Excision of renal calculi. 3. Surgery for testicular torsion. 4. Hernia surgery. 5. Right great toe surgery.  SOCIAL HISTORY:  The patient is married and has two children.  Denies use of tobacco products.  Only seldom occasional intake  of alcohol.  He is a long Production assistant, radio.  FAMILY HISTORY:  Significant for heart disease and diabetes.  REVIEW OF SYSTEMS:  GENERAL:  No fevers, chills, night sweats.  NEUROLOGIC:  The patient has had some dizziness and some gait disturbance.  He has not fallen.  No syncope or presyncope.  RESPIRATORY:  No shortness of breath, productive cough, or hemoptysis.  CARDIOVASCULAR:  No chest pain, angina, or orthopnea.  GASTROINTESTINAL:  No nausea, vomiting, or diarrhea.  The patient has had some constipation which he attributed postoperatively to his pain medications. This is under better control.  No blood or mucus in the stool.  GENITOURINARY:  No dysuria, hematuria, discharge.  MUSCULOSKELETAL:  Pertinent to that of the neck, history of present illness.  PHYSICAL EXAMINATION:  VITAL SIGNS:  Pulse 64, respiratory rate 16, blood pressure 142/76.  GENERAL:  The patient is a 70 year old white male, well-developed, well-nourished, large barrel chested, overweight.  Appears to be in mild to moderate pain secondary to his neck and shoulder pain.  He is accompanied by his wife.  He appears to be somewhat groggy on exam.  Appears to be an average historian.  Much of the history is obtained from his wife.  HEENT:  Normocephalic, atraumatic.  Pupils are equal, round and reactive to light.  Oropharynx clear.  NECK:  Posterior cervical incision is noted.  Cervical spine is very tender on exam.  He has decreased range of motion in the  neck due to pain.  He also has pain in the right shoulder.  He has signs of possible bursitis.  The right shoulder is injected subacromially.  The patient does have good relief following injection.  However, he continues to have severe cervical pain.  The patient is noted to have some signs of ataxia on exam.  No obvious clonus is noted.  IMPRESSION: 1. Intractable postoperative cervical pain. 2. Gait disturbance. 3. Recent status post cervical  foraminotomy at C4-5 and C5-6. 4. Hypertension. 5. Gouty arthritis.  PLAN:  The patient is being admitted to [facility] for workup.  He will undergo an MRI of the brain and an MRI of the cervical spine.  This was recommended by Dr. Lesia Sago after a phone consultation done by Dr. Otelia Sergeant. Dr. Anne Hahn will also be consulted to evaluate the patient while he is in house.  MRI is pending.  Labs are pending at the time of this dictation. Dictated by:   Alexzandrew L. Perkins, P.A.-C. Attending:  Kerrin Champagne, M.D. DD:  01/31/01 TD:  01/31/01 Job: [id number]

## 2010-10-22 NOTE — Op Note (Signed)
[facility]  Patient:    Troy Banks, Troy Banks Visit Number: [id number] MRN: [id number]          Service Type: SUR Location: 4W 0481 01 Attending Physician:  Lubertha South Proc. Date: 01/23/01 Adm. Date:  01/23/2001 Disc. Date: 01/24/2001                             Operative Report  PREOPERATIVE DIAGNOSIS:  Herniated nucleus pulposus right C4-5.  POSTOPERATIVE DIAGNOSES:  Herniated nucleus pulposus right C4-5, spondylosis changes right C5-6.  PROCEDURE:  Right C4-5 and right C5-6 posterior cervical [place] foraminotomies, fibrin glue application to a small less than 1 mm dural leak axillary portion of the C6 nerve root right side.  SURGEON:  Kerrin Champagne, M.D.  ASSISTANT:  Illene Labrador. [place], M.D.  ANESTHESIA:  GOT, Dr. Rica Mast.  ESTIMATED BLOOD LOSS:  250 cc.  DRAINS:  Foley to straight drain.  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 This has been ongoing for several months. He has undergone carpal tunnel release. He has persistent numbness into his right hand radial three digits and underwent further workup including MRI study which demonstrated a large disk protrusion right side C4-5. It was felt that this particular disk herniation most likely is resulting in compression of the C5 nerve root as well as the C6 nerve root and some flattening of the right aspect of the spinal cord. Because of findings of multilevel degenerative disk disease and spondylosis, a posterior cervical foraminotomy is recommended.  INTRAOPERATIVE FINDINGS:  The patient was found to have disk protrusion on the right side of C4-5 causing right sided C5 nerve compression as well as some very mild thecal sac compression on the right side. Some mild spondylosis changes affecting the right C6 nerve root at the C5-6 level.  DESCRIPTION OF  PROCEDURE:  After Adequate general anesthesia, the patient in a prone position, chest rolls, TED hose both legs to prevent DVT, standard preoperative antibiotics, standard prep with duraprep over the posterior aspect of the neck. The head was in the [place] horseshoe with gel pad to protect the soft tissue of the face. Shoulders with tape to try and lower them. The patient in slight reverse Trendelenburg. Neck flexed approximately 20-30 degrees. Draped in the usual manner, iodine impregnated Vidrape. The incision made at the expected C4-5 level as demonstrated by palpation of the C2 spinous process posteriorly. Midline determined by using the spinous process of C7 and the mid portion of the skull. The incision approximately 3.5 to 4 cm in length through the skin and subcutaneous layers down to the ligament of nuchae overlying the spinous processes. These were then incised on the left side and then on the right side using electrocautery, Cobbs then used to elevate the paracervical muscles off the spinous processes bilaterally and clamps placed on the spinous process of the expected C3 and C4 levels. Radiographically the clamps appear to be on the C3 and C4 level.  The incision was then carried along the lateral aspect of the cervical spine extending from C4 to C5, Cobbs then used to elevate the paracervical muscles off the lamina posteriorly along the right side, curved Mayo scissors used to divide the paracervical muscles off of the insertion into the inferior aspect of the lamina on the right side exposing the right side inner laminar  region posteriorly as well as the right medial and posterior aspect of the facet. McCullough retractor was then inserted providing excellent exposure. The operating room microscope draped and brought into the field. Under a microscope, a high speed bur was then used to bur the medial aspect of the facet C5-6 as was later identified. A small portion of the  inferior aspect of the lamina of C5 was thinned as well as the medial 40% of the C5-6 facet and superior aspect of C6 lamina. 1 mm and 2 mm Kerrisons were then able to be reduced beneath the lamina resecting a small portion of the superior aspect of the lamina of C6, inferior aspect of the lamina of C5 small portion. The medial aspect of the facet at the C5-6 level. The foramen for the C6 nerve root was found to be quite tight. The C6 nerve root appeared to have compression present at this point in the case. The ligamentum flavum was then elevated using a titanium nerve hook. The epidural vein cauterized beneath the ligamentum flavum. This provided an entry point between the epidural veins and the posterior thecal sac on the right side and these were further elevated again with the titanium nerve hook and then electrocautery used to divide them, bipolar electrocautery was used. Following this, then a high speed bur was used to thin the pedicle on the right side at C6 and a 4-0 straight microcuret then used to carefully perform a small partial superomedial pediculectomy to allow for entry to within the spinal canal between the neurovascular sheath and the thecal sac within the axillary portion of the C6 nerve root. Note that the neurovascular leash holding the C6 nerve root anteriorly was released posteriorly decompressing the nerve nicely. Examining beneath the C6 nerve root, a large spur was found to be present but no sign of disk protrusion. During this portion of the procedure, it was noted that a small fragment from the area of resection of the pedicles superiorly was impressing on the axillary portion of the C6 nerve root and did result in a very small pin point area of dural leak. This pin point area was too small to allow for suturing and appeared to close itself quite readily. Therefore it was felt that only Surgicel and use of fibrin glue would be adequate to repair. Intraoperative  radiographs were then obtained to reexamine the area as the lack of disk protrusion was a significant finding and it is felt that  perhaps due to the patients size, the largeness of the shoulders that the level of foraminotomy was more superior to this and with C-arm fluoro then brought into the field, we were able to ascertain that the foraminotomy had been performed at the C5-6 level. With this then, carefully Gelfoam was placed over the foraminotomy site. Bleeders controlled using bipolar electrocautery. Exposure was obtained on the right side at the C4-5 level. McCullough retractors were then inserted at this level. The operating room microscope then used to visualize the C4-5 level posterior on the right side. A high speed bur was then used to carefully bur the medial aspect of the facet on the right side of C4-5, inferior aspect of the lamina of C4, superior aspect of the lamina of C5. 1 mm and 2 mm Kerrisons were then able to be introduced and used to resect a small portion of the inferior aspect of the lamina of C4 and of the superior aspect of C5. Medial facet and facetectomy performed over about 30%  of the joint at this level. The ligamentum flavum was then elevated and the epidural veins cauterized releasing this. The interval between the epidural veins of the posterior aspect of the thecal sac and the thecal sac itself were then carefully elevated and cauterized dividing them appropriately. The axillary portion of the nerve root was then identified, small partial superior medial pediculectomy was performed using 3-0 microcuret straight. This allowed for adequate exposure of the axillary portion of the C5 nerve root and the thecal sac to allow for retraction of the C5 nerve root. The C5 nerve root was retracted superiorly. A very large disk protrusion was noted to be present here. Holding the nerve root retracted superiorly then a 15 blade scalpel was then used to incise the  posterior aspect of the disk on the right side and disk material was noted to be present. Titanium nerve hooks as well as regular nerve hook was used to carefully probe the spinal canal, remove disk material from the right side. Several fragments of disk material including a very large fragment was attached, cartilaginous material was resected from this area posteriorly decompressing the thecal sac and the right C5 nerve root. When this was completed, the C5 nerve root appeared to exit freely without any difficulty along the right side out the neuroforamen without further compression. Irrigation was performed, bleeders controlled using bipolar electrocautery. A small portion of thrombin soaked Gelfoam then placed over the posterior aspect of the foraminotomy site at this level. The right sided C5-6 level, a small pin point drill leak was then again re-explored and irrigation performed. There did not appear to be any significant bleeding or leakage from the area of dural leak. With this then, a small portion of fibrin glue was placed over the area of the small pin point opening after it was felt to be dry. A portion of Surgicel a centimeter on the side square was then placed over this area. With this there was on active CSF leak present. There was no active bleeding present. A small portion of thrombin soaked Gelfoam was placed loosely over the foraminotomy site on the right side at C5-6. Soft tissue was examined, no active bleeding was felt to be present, the [facility] retractors were removed. The ligamentum flavum layer was then reapproximated with interrupted #0 Ethibond sutures. The deep subcu layers approximated with interrupted #0 Vicryl sutures, more superficial layers with interrupted 2-0 Vicryl sutures and the skin closed with a running subcu stitch of 4-0 Vicryl. Tinctured Benzoin and Steri-Strips applied. 4 x 4 affixed to the skin with hypofix tape.  COMPLICATIONS:  The patient  sustained a very small dural leak right axillary portion of the C6 nerve root. The patient underwent surgery right side at C5-6 and then at C4-5. Attending Physician:  Lubertha South DD:  01/23/01 TD:  01/24/01 Job: [id number]

## 2010-10-22 NOTE — Op Note (Signed)
NAME:  Troy Banks, Troy Banks                      ACCOUNT NO.:  [id number]   MEDICAL RECORD NO.:  [id number]                   PATIENT TYPE:  AMB   LOCATION:  [facility]                                 FACILITY:  [facility]   PHYSICIAN:  Marlowe Kays, M.D.               DATE OF BIRTH:  1940-12-19   DATE OF PROCEDURE:  09/30/2003  DATE OF DISCHARGE:                                 OPERATIVE REPORT   PREOPERATIVE DIAGNOSES:  Painful retained screws left great toe status post  MP joint fusion.   POSTOPERATIVE DIAGNOSES:  Painful retained screws left great toe status post  MP joint fusion.   OPERATION:  Removal of two retained screws left great toe.   SURGEON:  Marlowe Kays, M.D.   ASSISTANT:  Nurse.   ANESTHESIA:  MAC with local Marcaine supplement.   PATHOLOGY AND JUSTIFICATION FOR PROCEDURE:  Fusion was performed on November 27, 2002 and appears to be solid. The screws particularly the lateral one are  painful for him and cause him gait alteration.   DESCRIPTION OF PROCEDURE:  He was given an MAC supplement anesthesia, the  left foot and ankle were prepped with Duraprep, draped in a sterile field.  I used local 0.5% plain Marcaine in addition to the MAC. Incisions were made  medially and laterally and the screws removed without complications and  saved for the patient.  The incisions were closed with interrupted  4-0  nylon.  Betadine Adaptic dry sterile dressing were applied.  He tolerated  the procedure well and was taken to the recovery room in satisfactory  condition with no known complications.                                               Marlowe Kays, M.D.    JA/MEDQ  D:  09/30/2003  T:  09/30/2003  Job:  [id number]

## 2010-10-22 NOTE — Op Note (Signed)
TNAMETORIBIO, SEIBER                     ACCOUNT NO.:  [id number]   MEDICAL RECORD NO.:  [id number]                   PATIENT TYPE:  OBV   LOCATION:  0445                                 FACILITY:  [facility]   PHYSICIAN:  James P. Aplington, M.D.            DATE OF BIRTH:  [birth year]   DATE OF PROCEDURE:  01/08/2002  DATE OF DISCHARGE:  01/08/2002                                 OPERATIVE REPORT   PREOPERATIVE DIAGNOSIS:  Painful degenerative arthritis metacarpophalangeal  joint left great toe.   POSTOPERATIVE DIAGNOSIS:  Painful degenerative and gouty arthritis  metacarpophalangeal joint left great toe.   OPERATION:  1. Simple bunionectomy.  2. Debridement of osteophytes and gouty crystals.  3. Insertion of Swanson titanium MP joint great toe prosthesis left foot.   SURGEON:  Illene Labrador. Aplington, M.D.   ASSESSMENT:  Nurse.   ANESTHESIA:  Spinal.   PATHOLOGY AND JUSTIFICATION FOR PROCEDURE:  Many years ago I had performed  an MP replacement in his right great toe with what was being used at the  time -  - a Programmer, systems prosthesis.  He has done well with this with no  problems but in keeping with the current thinking, the titanium is used  today.  He is on allopurinol for gout.   DESCRIPTION OF PROCEDURE:  Prophylactic antibiotics, satisfactory spinal  anesthesia, pneumatic tourniquet, left foot and ankle prepped with DuraPrep,  draped in a sterile field.  The usual bunion incision was made along the  inner border of the distal first metatarsal, extending over the base of the  proximal phalanx of the great toe.  Incision was made down through the  capsule, the MP joint, and large amount of gouty crystals were noted and  were debrided out.  I undermined the dorsal base of the proximal phalanx and  also subperiosteally to the outer side of the first metatarsal head,  removing osteophytes and gouty material.  With the three-quarter inch  straight osteotome, I then  performed standard simple bunionectomy with  additional debridement with small rongeur.  I then removed all visible  osteophytes from the first metatarsal head.  Then with a microsaw, I made my  initial cut at the base of the proximal phalanx, removing overhanging lip,  and then made a second and final cut just distal to the articular surface in  a squared-off fashion.  There appeared to be then adequate working room for  the prosthesis.  I used curettes to first open up the base of the proximal  phalanx and then used rasp, and I felt the #4 size was going to be the  appropriate size and worked up the #4 rasp.  I had a little bit of  difficulty getting the trial in.  I had to smooth down the metatarsal head  some and also make a little notch in the dorsum of the proximal phalanx, but  this allowed me to get the  trial prosthesis in; it fit nicely and,  accordingly, we went ahead with the final size 4 after irrigating the wound  well.  The prosthesis fit nicely.  There was excellent passive motion and  good stability.  The wound was then closed with interrupted 0 Vicryl in the  capsule and then the subcutaneous tissue and toe were blocked with 0.5%  plain  Marcaine, and the skin and subcutaneous tissue were then closed with  interrupted 4-0 nylon mattress sutures.  Betadine, Adaptic dry sterile  dressing were applied.  The tourniquet was released.  He tolerated the  procedure well and was taken to the recovery room in satisfactory condition  with no known complications.                                               James P. Aplington, M.D.    JPA/MEDQ  D:  01/08/2002  T:  01/11/2002  Job:  [id number]

## 2010-10-22 NOTE — Op Note (Signed)
NAME:  Troy Banks, Troy Banks                      ACCOUNT NO.:  [id number]   MEDICAL RECORD NO.:  [id number]                   PATIENT TYPE:  AMB   LOCATION:  DAY                                  FACILITY:  [facility]   PHYSICIAN:  Marlowe Kays, M.D.               DATE OF BIRTH:  November 13, 1940   DATE OF PROCEDURE:  11/27/2002  DATE OF DISCHARGE:                                 OPERATIVE REPORT   PREOPERATIVE DIAGNOSIS:  Painful titanium prosthesis, metatarsophalangeal  joint, left great toe.   POSTOPERATIVE DIAGNOSIS:  Erosion of first metatarsal head from titanium  prosthesis, metatarsophalangeal joint, left great toe.   OPERATION:  Exploration, metatarsophalangeal joint left great toe, with  removal of titanium prosthesis and fusion of metatarsophalangeal joint using  crossed screws and Allomatrix.   SURGEON:  Marlowe Kays, M.D.   ASSISTANT:  Nurse.   ANESTHESIA:  Spinal.   PATHOLOGY AND JUSTIFICATION FOR PROCEDURE:  He has had a successful Silastic  implant in this joint for years in his right great toe.  I placed the  titanium prosthesis in his great toe on January 08, 2002.  He initially did  well but has had progressive pain.  X-rays show some minor osteophyte  formation, but I told him myself that the source of his pain was most likely  erosion of the metal against the articular cartilage of the first metatarsal  head, and the preoperative plan was either to debride up the prosthesis and  great toe MP joint or remove the prosthesis and fuse the joint.   PROCEDURE:  Satisfactory spinal anesthesia.  Pneumatic tourniquet, Duraprep  to the foot and ankle, was draped as a sterile field.  We went through the  old surgical incision dorsomedially and incised down through the capsule.  Went down to the prosthesis, and there was clearly no evidence of infection.  There was also no evidence of any loosening.  We then began debriding up  around the prosthesis until I had enough flexion  that I could look at the  first metatarsal head, and the prosthesis had clearly eroded through the  articular cartilage on the medial half of the metatarsal head.  Based on  this I felt that the treatment of choice was the removal and fusion.  Consequently, I then freed up the remainder of the prosthesis and was able  to remove it fairly atraumatically.  I then debrided up the remnant of  articular cartilage in the first metatarsal head and got down to the  bleeding bone, then also debrided up fibrous tissue with a curette from the  implant site on the base of the proximal phalanx.  Next using the C-arm, I  placed two pins for 4.0 cannulated cancellous screws, both above the  metaphyseal flare with one medially and one laterally in the proximal  phalanx.  When the pins were noted to be in good position on both AP and  lateral projections, they were measured and appropriate screws of 40 and 42  mm lengths were placed.  We made sure that the screws were in good position,  then used Allomatrix to pack the canal of the proximal phalanx and fill the  gap from the removal of the prosthesis.  When I was satisfied that we had  packed in all the Allomatrix we could, we took final AP and lateral x-rays,  which again looked excellent.  The wound was then irrigated with sterile  saline.  The capsular tissue was closed with interrupted 3-0 Vicryl, the  skin with interrupted 4-0 Monocryl mattress suture.  Betadine and Adaptic dry sterile dressing and splint-type short leg cast was  applied.  The tourniquet was released.  He tolerated the procedure well and  was returned to the recovery room in satisfactory condition with no known  complications.                                                Marlowe Kays, M.D.    JA/MEDQ  D:  11/27/2002  T:  11/27/2002  Job:  [id number]

## 2010-10-22 NOTE — H&P (Signed)
Troy Banks. [facility]  Patient:    Troy, Banks Visit Number: [id number] MRN: [id number]          Service Type: MED Location: [phone] Attending Physician:  Arlis Porta Dictated by:   Adolph Pollack, M.D. Admit Date:  05/04/2001   CC:         Troy Banks, M.D.   History and Physical  CHIEF COMPLAINT: Lower abdominal pain.  HISTORY OF PRESENT ILLNESS: This is a 70 year old male, who had sudden onset of severe cramping type lower abdominal pain the evening of May 02, 2001. The pain improved for a while but then returned diffusely and was associated with nausea and fever.  The pain progressively worsened and he presented to Dr. Lorenz Coaster for evaluation.  Dr. Lorenz Coaster was concerned he had appendicitis and so asked me to see him.  I had him come to the emergency department for evaluation.  He was anorexic and also reported some chills and some urinary urgency.  He denied any change in bowel habits.  PAST MEDICAL HISTORY:  1. Diverticulitis.  2. Hypertension.  3. Gouty arthritis.  4. Nephrolithiasis.  5. Testicular torsion.  PAST SURGICAL HISTORY:  1. Nephrolithotomy x 2.  2. C4-5 foraminotomy.  3. Orchiopexy.  ALLERGIES: VALIUM.  MEDICATIONS:  1. Captopril.  2. Cardura.  3. Indocin.  4. Allopurinol.  5. Restoril p.r.n.  6. Percocet p.r.n.  SOCIAL HISTORY: He occasionally has an alcoholic beverage.  Denies tobacco use.  He is married.  FAMILY HISTORY: Mother has hypertension and coronary artery disease.  REVIEW OF SYSTEMS: CARDIOVASCULAR: No known heart disease.  PULMONARY: No pneumonia, asthma, tuberculosis, or COPD.  GI: No peptic ulcer disease, hepatitis.  GU: Kidney stones as mentioned above.  NEUROLOGIC: He has a C5 nerve compression and has some weakness in his right arm.  ENDOCRINE: No diabetes.  HEMATOLOGIC: No known bleeding disorders or transfusions.  PHYSICAL EXAMINATION:  GENERAL:  Uncomfortable-appearing male.  VITAL SIGNS: Temperature 101.3 degrees, blood pressure 137/64, pulse 74.  SKIN: Warm and dry without rash.  HEENT: Sclerae icteric.  NECK: Supple without palpable masses.  A posterior scar is noted.  CARDIOVASCULAR: Heart demonstrates regular rate and rhythm with a 2/6 systolic murmur.  RESPIRATORY: Breath sounds equal and clear.  Respirations unlabored.  ABDOMEN: Soft, nondistended.  Tender in the periumbilical region.  Also tender in the left lower quadrant greater than the right lower quadrant.  Most tenderness, however, is in the suprapubic region to both palpation and percussion.  There is a right flank scar present.  BACK: No CVA tenderness.  EXTREMITIES: No clubbing, cyanosis, or edema.  LABORATORY DATA: WBC 12,000 with leftward shift, hemoglobin 14.5. Electrolytes within normal limits except for potassium slightly low at 3.4. Total bilirubin 3.2, other liver function tests normal.  Urinalysis negative.  Three-way abdominal x-rays demonstrate no free air.  There are few prominent small bowel loops in the right lower quadrant.  Nonobstructive pattern.  CT scan of the abdomen demonstrates some thickening of sigmoid colon with inflammatory changes in the mesentery and a few punctate air bubbles in the mesenteric region.  IMPRESSION: Acute sigmoid diverticulitis.  PLAN: Admission.  Will try medical management with intravenous antibiotics and bowel rest.  If he fails this then he would need sigmoid colectomy and likely colostomy. Dictated by:   Adolph Pollack, M.D. Attending Physician:  Arlis Porta DD:  05/05/01 TD:  05/05/01 Job: [id number]

## 2010-10-22 NOTE — Op Note (Signed)
[place]. [facility]  Patient:    Troy Banks, Troy Banks Visit Number: [id number] MRN: [id number]          Service Type: Attending:  Payton Doughty, M.D. Dictated by:   Payton Doughty, M.D. Proc. Date: 07/18/01                             Operative Report  PREOPERATIVE DIAGNOSIS:  Herniated disk C4-5, spondylosis C5-6.  POSTOPERATIVE DIAGNOSIS:  Herniated disk C4-5, spondylosis C5-6.  OPERATION PERFORMED:  C4-5, C5-6 anterior cervical diskectomy and fusion with a tethered plate.  SURGEON:  Payton Doughty, M.D.  ANESTHESIA:  General endotracheal.  PREP:  Sterile Betadine prep and scrub with alcohol wipe.  COMPLICATIONS:  None.  ASSISTANT: 1. Hewitt Shorts, M.D. 2. [place]  DESCRIPTION OF PROCEDURE:  The patient is a 70 year old gentleman with severe cervical spondylosis and a disk at 4-5.  The patient was taken to the operating room, smoothly anesthetized and intubated, and placed supine on the operating table.  Following shave, prep and drape in the usual sterile fashion, the skin was incised paralleling the sternocleidomastoid starting one fingerbreadth below the level of the carotid tubercle and extending in a cephalic direction approximately 5 cm.  The platysma was identified, elevated and divided and undermined.  The sternocleidomastoid was identified.  Medial dissection revealed the carotid artery and jugular vein retracted laterally to the left, trachea and esophagus retracted laterally to the right exposing the bones of the anterior cervical spine.  A marker was placed and intraoperative x-ray obtained to confirm correctness of the level.  Having confirmed correctness of the level.  The longus colli was taken down bilaterally and the Shadowline self-retaining retractor placed.  Following placement of this retractor, diskectomy was carried out at 4-5 and 5-6 under gross observation. The operating microscope was then brought in, microdissection  technique used to explore the interior epidural space removing the remaining disk and decompressed the nerve roots bilaterally.  At C4-5 there was a herniated disk eccentric to the right with compression of the right 5 root.  At 5-6 there was a spondylitic bar with involvement bilaterally.  Complete decompression was achieved.  7 mm bone grafts were fashioned from patellar allograft and tapped into place.  A 35 mm tether plate was then placed with two 13 mm screws in C4, one in C5 and two in C6.  Intraoperative x-ray showed good placement of bone grafts, plate and screws.  The wound was irrigated and hemostasis assured. The platysma was reapproximated with 3-0 Vicryl in interrupted  fashion. Subcutaneous tissues reapproximated with 3-0 Vicryl in interrupted fashion. Skin was closed with 4-0 Vicryl suture in running subcuticular fashion. Benzoin and Steri-Strips were placed and made occlusive with Telfa and Op-Site.  The patient then placed in the Aspen collar and returned to the recovery room. Dictated by:   Payton Doughty, M.D. Attending:  Payton Doughty, M.D. DD:  07/18/01 TD:  07/18/01 Job: [id number]

## 2010-10-22 NOTE — H&P (Signed)
[place]. [facility]  Patient:    Troy Banks, Troy Banks Visit Number: [id number] MRN: [id number]          Service Type: SUR Location: [phone] Attending Physician:  Emeterio Reeve Dictated by:   Payton Doughty, M.D. Admit Date:  07/18/2001 Discharge Date: 07/19/2001                           History and Physical  ADMITTING DIAGNOSES:  Spondylosis of C4-5 and C5-6.  HISTORY:  A 70 year old left-handed white gentleman who had a herniated disk in his neck.  Underwent posterior cervicectomy at C4-5 and C5-6; apparently not much found at C5-6.  Postoperatively he has had persistent right shoulder pain, pain and numbness in the C6 distribution on the right side.  Left side he has some left C5 discomfort.  He had a myelogram demonstrating cyst at C4-5 as well as degenerative changes at C5-6 and referred himself to me.  PAST MEDICAL HISTORY:  Remarkable for hypertension and gout, He has heart disease which manifests as a 2/6 murmur and has been evaluated by a cardiologist and thought to be benign.  MEDICATIONS: 1. Cardura 4 mg q.d. 2. Captopril 25/25 b.i.d. 3. Allopurinol 300 mg q.d. 4. Robaxin 750 mg p.r.n. 5. Percocet 5/325 p.r.n. 6. Indocin 75 mg b.i.d. for arthritis. 7. Celexa 40 mg q.d. for depression.  ALLERGIES:  Intolerant to VALIUM.  PAST SURGICAL HISTORY:  Posterior cervical diskectomy.  SOCIAL HISTORY:  Does not smoke.  Drinks only socially.  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  FAMILY HISTORY:  Mom is 36 with hypertension, arthritis.  Dad is deceased. History is not given.  REVIEW OF SYSTEMS:  Remarkable for glasses, hypertension, heart murmur, diverticular disease, arm weakness, arm pain, arthritis, neck pain, difficulty with memory and disorientation, difficulty with concentration, blurry vision, coordination, depression.  PHYSICAL EXAMINATION  HEENT:  Within normal limits.  NECK:  Somewhat limited range of  motion with no reproduction of shoulder pain.  CHEST:  Clear.  CARDIAC:  A 2/6 systolic murmur, neither decrescendo nor crescendo.  ABDOMEN:  Nontender.  No hepatosplenomegaly.  EXTREMITIES:  Without clubbing or cyanosis.  GENITOURINARY:  Deferred.  EXTREMITIES:  Peripheral pulses are good.  NEUROLOGIC:  He is awake, alert, and oriented.  Cranial nerves are intact. Motor examination shows 5/5 strength throughout the upper extremities.  He has a lot of pain with right arm testing.  Sensory deficit described in the right C6 distribution.  Deep tendon reflexes are absent at the right biceps, 1 at the left, 1 at the triceps, 1 at the brachioradialis bilaterally.  Hoffmans is negative.  Lower extremities are nonmyelopathic.  LABORATORIES:  Myelogram:  Post myelogram CT demonstrated disk at 4-5 ______ to the right with elevation of the right C5 root.  There is also an osteophyte.  No vertebral hypertrophy.  At C5-6 there is osteophytic ridging posteriorly as well as anteriorly.  CLINICAL IMPRESSION:  Mixed right C5 and C6 radiculopathy secondary to spondylosis.  PLAN:  Anterior cervicectomy fusion C4-5 and C5-6.  The risks and benefits of this approach have been discussed with him and he wished to proceed. Dictated by:   Payton Doughty, M.D. Attending Physician:  Emeterio Reeve DD:  07/18/01 TD:  07/18/01 Job: [id number]

## 2010-10-22 NOTE — Discharge Summary (Signed)
[place]. [facility]  Patient:    Troy Banks, COIN Visit Number: [id number] MRN: [id number]          Service Type: MED Location: [phone] Attending Physician:  Arlis Porta Dictated by:   Adolph Pollack, M.D. Admit Date:  05/04/2001 Discharge Date: 05/08/2001   CC:         Reuben Likes, M.D.   Discharge Summary  PRINCIPAL DISCHARGE DIAGNOSIS: Acute sigmoid diverticulitis.  SECONDARY DIAGNOSES: 1. Hypokalemia. 2. Hypertension. 3. Gout. 4. Nephrolithiasis.  PROCEDURES:  None.  REASON FOR ADMISSION:  The patient is a 70 year old male with the sudden onset of severe cramping pain in the lower abdomen who presented to his primary care physician Dr. Lorenz Coaster.  The pain had progressively worsened by the time he saw Dr. Lorenz Coaster and there was concern for appendicitis and he was sent to the emergency room for my evaluation.  Of note was that he had some abdominal tenderness and a fever as well as elevation of his white blood cell count.  A CT scan demonstrated findings consistent with sigmoid diverticulitis and he was admitted.  HOSPITAL COURSE:  The patient was started on intravenous antibiotics.  He was noted to have a hyperbilirubinemia which slowly came down as the patient improved.  His intravenous antibiotics were continued and it took approximately three days before he started feeling better.  His hypokalemia was replaced with potassium supplements.  He was placed on his oral antihypertensives which kept his blood pressure under control.  By 05/08/01 the patient was feeling much better.  He is tolerating a low residue diet.  He subsequently was discharged.  DISPOSITION:  Patient is discharged to home in satisfactory condition.  DIET:  Low residue.  MEDICATIONS:  Patient is given Cipro and Flagyl to take for antibiotics and was told to continue his home medications.  FOLLOW UP:  Patient is to return to see me in two weeks  for follow up. Dictated by:   Adolph Pollack, M.D. Attending Physician:  Arlis Porta DD:  05/24/01 TD:  05/25/01 Job: [id number]

## 2012-01-05 ENCOUNTER — Encounter: Payer: Self-pay | Admitting: Gastroenterology

## 2012-09-13 ENCOUNTER — Encounter: Payer: Self-pay | Admitting: Gastroenterology

## 2013-10-21 ENCOUNTER — Ambulatory Visit: Payer: Self-pay | Admitting: Internal Medicine

## 2013-10-24 ENCOUNTER — Ambulatory Visit (INDEPENDENT_AMBULATORY_CARE_PROVIDER_SITE_OTHER): Payer: Medicare Other | Admitting: Internal Medicine

## 2013-10-24 ENCOUNTER — Encounter: Payer: Self-pay | Admitting: Internal Medicine

## 2013-10-24 ENCOUNTER — Other Ambulatory Visit (INDEPENDENT_AMBULATORY_CARE_PROVIDER_SITE_OTHER): Payer: Medicare Other

## 2013-10-24 VITALS — BP 146/82 | HR 43 | Temp 98.4°F | Ht 68.0 in | Wt 246.0 lb

## 2013-10-24 DIAGNOSIS — Z9989 Dependence on other enabling machines and devices: Secondary | ICD-10-CM

## 2013-10-24 DIAGNOSIS — I10 Essential (primary) hypertension: Secondary | ICD-10-CM | POA: Diagnosis not present

## 2013-10-24 DIAGNOSIS — R001 Bradycardia, unspecified: Secondary | ICD-10-CM

## 2013-10-24 DIAGNOSIS — I498 Other specified cardiac arrhythmias: Secondary | ICD-10-CM

## 2013-10-24 DIAGNOSIS — R634 Abnormal weight loss: Secondary | ICD-10-CM | POA: Diagnosis not present

## 2013-10-24 DIAGNOSIS — E119 Type 2 diabetes mellitus without complications: Secondary | ICD-10-CM | POA: Diagnosis not present

## 2013-10-24 DIAGNOSIS — G4733 Obstructive sleep apnea (adult) (pediatric): Secondary | ICD-10-CM | POA: Insufficient documentation

## 2013-10-24 LAB — CBC WITH DIFFERENTIAL/PLATELET
BASOS PCT: 0.8 % (ref 0.0–3.0)
Basophils Absolute: 0.1 10*3/uL (ref 0.0–0.1)
EOS ABS: 0.1 10*3/uL (ref 0.0–0.7)
Eosinophils Relative: 1.9 % (ref 0.0–5.0)
HCT: 46.9 % (ref 39.0–52.0)
HEMOGLOBIN: 16.4 g/dL (ref 13.0–17.0)
LYMPHS PCT: 31.5 % (ref 12.0–46.0)
Lymphs Abs: 2.3 10*3/uL (ref 0.7–4.0)
MCHC: 35 g/dL (ref 30.0–36.0)
MCV: 88.9 fl (ref 78.0–100.0)
MONOS PCT: 10 % (ref 3.0–12.0)
Monocytes Absolute: 0.7 10*3/uL (ref 0.1–1.0)
Neutro Abs: 4.1 10*3/uL (ref 1.4–7.7)
Neutrophils Relative %: 55.8 % (ref 43.0–77.0)
Platelets: 149 10*3/uL — ABNORMAL LOW (ref 150.0–400.0)
RBC: 5.28 Mil/uL (ref 4.22–5.81)
RDW: 13.9 % (ref 11.5–15.5)
WBC: 7.4 10*3/uL (ref 4.0–10.5)

## 2013-10-24 LAB — BASIC METABOLIC PANEL WITH GFR
BUN: 17 mg/dL (ref 6–23)
CO2: 28 meq/L (ref 19–32)
Calcium: 9.9 mg/dL (ref 8.4–10.5)
Chloride: 105 meq/L (ref 96–112)
Creatinine, Ser: 1.3 mg/dL (ref 0.4–1.5)
GFR: 60.55 mL/min
Glucose, Bld: 108 mg/dL — ABNORMAL HIGH (ref 70–99)
Potassium: 4.9 meq/L (ref 3.5–5.1)
Sodium: 140 meq/L (ref 135–145)

## 2013-10-24 LAB — URINALYSIS, ROUTINE W REFLEX MICROSCOPIC
Bilirubin Urine: NEGATIVE
Hgb urine dipstick: NEGATIVE
Ketones, ur: NEGATIVE
Leukocytes, UA: NEGATIVE
Nitrite: NEGATIVE
RBC / HPF: NONE SEEN (ref 0–?)
SPECIFIC GRAVITY, URINE: 1.01 (ref 1.000–1.030)
TOTAL PROTEIN, URINE-UPE24: NEGATIVE
URINE GLUCOSE: NEGATIVE
Urobilinogen, UA: 0.2 (ref 0.0–1.0)
WBC UA: NONE SEEN (ref 0–?)
pH: 7 (ref 5.0–8.0)

## 2013-10-24 LAB — LIPID PANEL
Cholesterol: 183 mg/dL (ref 0–200)
HDL: 33.8 mg/dL — ABNORMAL LOW (ref >39.00)
LDL Cholesterol: 91 mg/dL (ref 0–99)
Total CHOL/HDL Ratio: 5
Triglycerides: 292 mg/dL — ABNORMAL HIGH (ref 0.0–149.0)
VLDL: 58.4 mg/dL — ABNORMAL HIGH (ref 0.0–40.0)

## 2013-10-24 LAB — HEPATIC FUNCTION PANEL
ALBUMIN: 4.1 g/dL (ref 3.5–5.2)
ALT: 42 U/L (ref 0–53)
AST: 31 U/L (ref 0–37)
Alkaline Phosphatase: 47 U/L (ref 39–117)
Bilirubin, Direct: 0.2 mg/dL (ref 0.0–0.3)
Total Bilirubin: 1.9 mg/dL — ABNORMAL HIGH (ref 0.2–1.2)
Total Protein: 7.1 g/dL (ref 6.0–8.3)

## 2013-10-24 LAB — HEMOGLOBIN A1C: HEMOGLOBIN A1C: 6.5 % (ref 4.6–6.5)

## 2013-10-24 NOTE — Progress Notes (Signed)
Subjective:    Patient ID: Troy Banks, male    DOB: Apr 20, 1941, 73 y.o.   MRN: [id number]  HPI  Patient here to establish with new PCP - primarily follows at [place] chronic medical issues and interval medical events  Type 2 diabetes. Reports always diet controlled. Home CBGs fasting under 140. Reports the [place] provider recommended oral medications December 2014 visit, but the patient declined to begin same. Reports unintentional weight loss of 20 pounds in past 6 months  Hypertension. Historically off ACE inhibitor because of hyperkalemia in setting of renal dysfunction, I recommended to resume ACE inhibitor December 2014 by [place]. Reports blood pressure L. Has been well-controlled but concerned about heart rate less than 50 >67mo -denies dizziness, syncope, leg swelling but endorses episodic chest tightness and daily fatigue  Obstructive sleep apnea. On nightly CPAP for treatment of same. Prescribed by [place]. Denies snoring or problems with CPAP function. No daytime napping  Past Medical History  Diagnosis Date  . COLONIC POLYPS, HX OF   . DIABETES MELLITUS     diet controlled  . DIVERTICULOSIS, COLON   . GOUT   . NEPHROLITHIASIS, HX OF     remote open stone-retrieval on L  . HYPERTENSION     off ACEI 2010 because of hyperkalemia in setting of ARI  . OSA on CPAP    Family History  Problem Relation Age of Onset  . Heart disease Mother   . Hypertension Mother   . Heart disease Father   . Hypertension Father   . Prostate cancer Maternal Uncle   . Hypertension Maternal Grandmother   . Heart disease Maternal Grandmother   . Hypertension Maternal Grandfather   . Heart disease Maternal Grandfather   . Hypertension Paternal Grandfather   . Heart disease Paternal Grandfather   . Diabetes Paternal Grandfather   . Alcohol abuse Other   . Arthritis Other    History  Substance Use Topics  . Smoking status: Former Research scientist (life sciences)  . Smokeless tobacco: Not on file   Comment: widowed 1991 - lives with SO [name] who is Therapist, sports  . Alcohol Use: Yes    Review of Systems  Constitutional: Positive for fatigue and unexpected weight change (20# loss in past 6 mo (reports 268# in 05/2014)). Negative for fever, diaphoresis, activity change and appetite change.  Eyes: Negative for visual disturbance.  Respiratory: Positive for chest tightness (occ, nonexertional) and shortness of breath (with exertion). Negative for cough and wheezing.   Cardiovascular: Negative for chest pain, palpitations and leg swelling.  Gastrointestinal: Negative for abdominal pain and abdominal distention.  Genitourinary: Positive for decreased urine volume and difficulty urinating (chronic). Negative for dysuria, urgency, frequency, hematuria, flank pain, penile swelling, scrotal swelling and testicular pain.  Musculoskeletal: Positive for arthralgias. Negative for gait problem, joint swelling and myalgias.  Neurological: Negative for dizziness, seizures, syncope, facial asymmetry, speech difficulty, weakness, light-headedness and headaches.  Psychiatric/Behavioral: Positive for decreased concentration. Negative for suicidal ideas, hallucinations, behavioral problems, confusion, sleep disturbance, self-injury, dysphoric mood and agitation. The patient is not nervous/anxious.   All other systems reviewed and are negative.      Objective:   Physical Exam  BP 146/82  Pulse 43  Temp(Src) 98.4 F (36.9 C) (Oral)  Ht 5\' 8"  (1.727 m)  Wt 246 lb (111.585 kg)  BMI 37.41 kg/m2  SpO2 97% Wt Readings from Last 3 Encounters:  10/24/13 246 lb (111.585 kg)  07/08/09 262 lb (118.842 kg)  01/02/09 246 lb (111.585  kg)   Constitutional: he is obese, but appears well-developed and well-nourished. No distress. SO [name] initially at side, then out of room for 2nd half hx and all exam HENT: Head: Normocephalic and atraumatic. Ears: B TMs ok, no erythema or effusion; Nose: Nose normal. Mouth/Throat:  Oropharynx is clear and moist. No oropharyngeal exudate.  Eyes: Conjunctivae and EOM are normal. Pupils are equal, round, and reactive to light. No scleral icterus.  Neck: Thick, normal range of motion. Neck supple. No JVD present. No thyromegaly present.  Cardiovascular: BRADYcardic, but regular rhythm and normal heart sounds.  No murmur heard. No BLE edema. Pulmonary/Chest: Effort normal and breath sounds diminished B bases. No respiratory distress. he has no wheezes.  Abdominal: Soft. Bowel sounds are normal. he exhibits no distension. There is no tenderness. no masses Musculoskeletal: Senile arthritic changes. Normal range of motion, no joint effusions. No gross deformities Neurological: he is alert and oriented to person, place, and time. No cranial nerve deficit. Coordination, balance, strength, speech and gait are normal.  Skin: Skin is warm and dry. No rash noted. No erythema.  Psychiatric: he has a dysphoric mood (chronic per SO) and affect. behavior is normal. Judgment and thought content normal.   Lab Results  Component Value Date   WBC 9.2 12/24/2008   HGB 11.4* 12/24/2008   HCT 32.8* 12/24/2008   PLT 114* 12/24/2008   GLUCOSE 121* 01/01/2009   CHOL 199 07/08/2009   TRIG 219.0* 07/08/2009   HDL 38.90* 07/08/2009   LDLDIRECT 130.2 07/08/2009   LDLCALC 89 01/02/2009   ALT 23 12/25/2008   AST 25 12/25/2008   NA 140 01/01/2009   K 4.5 07/08/2009   CL 106 01/01/2009   CREATININE 1.4 07/08/2009   BUN 19 07/08/2009   CO2 27 01/01/2009   TSH 3.224 *Test methodology is 3rd generation TSH* 12/24/2008   HGBA1C 6.6* 07/08/2009   MICROALBUR 0.3 01/02/2009    No results found.     Assessment & Plan:   Unintentional weight loss. Reports 20 pound loss in past 6 months. Historically reports 268# in 05/2013, now #246 10/2013 -check screening labs including A1c for diabetic control. Denies localizing abdominal symptoms -reviewed need for additional evaluation depending on weight trends and initial lab review,  the patient adamant and reluctant to pursue aggressive intervention or evaluation  Problem List Items Addressed This Visit   Bradycardia - Primary     Significant other who is RN reports chronic bradycardia in past 6 months (HR<50) since the [place] resumed ACE inhibitor DC lisinopril now Check labs ECG today with marked sinus brady @43bpm , no ischemic change or heart block ?symptomatic with fatigue, but denies syncope, near syncope, dizziness or dyspnea Discuss possibility of cardiology evaluation and consideration of pacemaker, but as patient feels "asymptomatic", he adamantly declines referral to cardiology or consideration of any intervention at this time    Relevant Orders      Basic metabolic panel      CBC with Differential      Hepatic function panel      Lipid panel      Urinalysis, Routine w reflex microscopic      EKG 12-Lead (Completed)   DIABETES MELLITUS     Reports historical diet controlled Check A1c now, consider medication as needed    Relevant Orders      Basic metabolic panel      Lipid panel      Hemoglobin A1c   HYPERTENSION      BP Readings  from Last 3 Encounters:  10/24/13 146/82  07/08/09 162/89  01/02/09 134/78   Controlled today Concern for use of ACE inhibitor given history of discontinuation lisinopril in 2010 following acute renal insufficiency with hyperkalemia ( hospitalization for same reviewed) Check labs given his history and bradycardia Stop ACE inhibitor today, consider reduction of beta blocker dose -see next    Relevant Medications      amLODipine (NORVASC) 10 MG tablet      metoprolol (LOPRESSOR) 50 MG tablet      furosemide (LASIX) 40 MG tablet   Other Relevant Orders      Basic metabolic panel      Lipid panel   OSA on CPAP    Other Visit Diagnoses   Loss of weight        Relevant Orders       Basic metabolic panel       CBC with Differential       Hepatic function panel       Lipid panel       Urinalysis, Routine w reflex  microscopic       Time spent with pt/s.o. today 60 minutes, greater than 50% time spent counseling patient on diabetes, potential depression vs PTSD, weight loss, bradycardia and medication review. Also review of available prior records -pt declines to sign ROI for [place]

## 2013-10-24 NOTE — Patient Instructions (Addendum)
It was good to see you today.  We have reviewed your prior records including labs and tests today  Test(s) ordered today. Your results will be released to [place] (or called to you) after review, usually within 72hours after test completion. If any changes need to be made, you will be notified at that same time.  Medications reviewed and updated Stop lisinopril - no other changes recommended at this time.  Please schedule followup in 6 weeks to recheck heart rate, call sooner if problems.  Bradycardia Bradycardia is a term for a heart rate (pulse) that, in adults, is slower than 60 beats per minute. A normal rate is 60 to 100 beats per minute. A heart rate below 60 beats per minute may be normal for some adults with healthy hearts. If the rate is too slow, the heart may have trouble pumping the volume of blood the body needs. If the heart rate gets too low, blood flow to the brain may be decreased and may make you feel lightheaded, dizzy, or faint. The heart has a natural pacemaker in the top of the heart called the SA node (sinoatrial or sinus node). This pacemaker sends out regular electrical signals to the muscle of the heart, telling the heart muscle when to beat (contract). The electrical signal travels from the upper parts of the heart (atria) through the AV node (atrioventricular node), to the lower chambers of the heart (ventricles). The ventricles squeeze, pumping the blood from your heart to your lungs and to the rest of your body. CAUSES   Problem with the heart's electrical system.  Problem with the heart's natural pacemaker.  Heart disease, damage, or infection.  Medications.  Problems with minerals and salts (electrolytes). SYMPTOMS   Fainting (syncope).  Fatigue and weakness.  Shortness of breath (dyspnea).  Chest pain (angina).  Drowsiness.  Confusion. DIAGNOSIS   An electrocardiogram (ECG) can help your caregiver determine the type of slow heart rate you  have.  If the cause is not seen on an ECG, you may need to wear a heart monitor that records your heart rhythm for several hours or days.  Blood tests. TREATMENT   Electrolyte supplements.  Medications.  Withholding medication which is causing a slow heart rate.  Pacemaker placement. SEEK IMMEDIATE MEDICAL CARE IF:   You feel lightheaded or faint.  You develop an irregular heart rate.  You feel chest pain or have trouble breathing. MAKE SURE YOU:   Understand these instructions.  Will watch your condition.  Will get help right away if you are not doing well or get worse. Document Released: 02/12/2002 Document Revised: 08/15/2011 Document Reviewed: 01/09/2008 [facility] Patient Information 2014 [place].

## 2013-10-24 NOTE — Assessment & Plan Note (Signed)
BP Readings from Last 3 Encounters:  10/24/13 146/82  07/08/09 162/89  01/02/09 134/78   Controlled today Concern for use of ACE inhibitor given history of discontinuation lisinopril in 2010 following acute renal insufficiency with hyperkalemia ( hospitalization for same reviewed) Check labs given his history and bradycardia Stop ACE inhibitor today, consider reduction of beta blocker dose -see next

## 2013-10-24 NOTE — Assessment & Plan Note (Signed)
Reports historical diet controlled Check A1c now, consider medication as needed

## 2013-10-24 NOTE — Assessment & Plan Note (Signed)
Significant other who is RN reports chronic bradycardia in past 6 months (HR<50) since the [place] resumed ACE inhibitor DC lisinopril now Check labs ECG today with marked sinus brady @43bpm , no ischemic change or heart block ?symptomatic with fatigue, but denies syncope, near syncope, dizziness or dyspnea Discuss possibility of cardiology evaluation and consideration of pacemaker, but as patient feels "asymptomatic", he adamantly declines referral to cardiology or consideration of any intervention at this time

## 2013-10-25 ENCOUNTER — Telehealth: Payer: Self-pay | Admitting: Internal Medicine

## 2013-10-25 NOTE — Addendum Note (Signed)
Addended by: [name] A on: 10/25/2013 08:11 AM   Modules accepted: Orders

## 2013-10-25 NOTE — Telephone Encounter (Signed)
Relevant patient education mailed to patient.  

## 2013-12-04 ENCOUNTER — Ambulatory Visit (INDEPENDENT_AMBULATORY_CARE_PROVIDER_SITE_OTHER): Payer: Medicare Other | Admitting: Family Medicine

## 2013-12-04 ENCOUNTER — Other Ambulatory Visit (INDEPENDENT_AMBULATORY_CARE_PROVIDER_SITE_OTHER): Payer: Medicare Other

## 2013-12-04 ENCOUNTER — Ambulatory Visit (INDEPENDENT_AMBULATORY_CARE_PROVIDER_SITE_OTHER)
Admission: RE | Admit: 2013-12-04 | Discharge: 2013-12-04 | Disposition: A | Discharge status: Home / Self Care | Payer: Medicare Other | Source: Ambulatory Visit | Attending: Family Medicine | Admitting: Family Medicine

## 2013-12-04 ENCOUNTER — Ambulatory Visit (INDEPENDENT_AMBULATORY_CARE_PROVIDER_SITE_OTHER): Payer: Medicare Other | Admitting: Internal Medicine

## 2013-12-04 ENCOUNTER — Encounter: Payer: Self-pay | Admitting: Family Medicine

## 2013-12-04 ENCOUNTER — Encounter: Payer: Self-pay | Admitting: Internal Medicine

## 2013-12-04 VITALS — BP 138/88 | HR 44 | Ht 68.0 in | Wt 243.0 lb

## 2013-12-04 VITALS — BP 138/88 | HR 44 | Temp 97.6°F | Wt 243.0 lb

## 2013-12-04 DIAGNOSIS — M76899 Other specified enthesopathies of unspecified lower limb, excluding foot: Secondary | ICD-10-CM | POA: Diagnosis not present

## 2013-12-04 DIAGNOSIS — R634 Abnormal weight loss: Secondary | ICD-10-CM

## 2013-12-04 DIAGNOSIS — R001 Bradycardia, unspecified: Secondary | ICD-10-CM

## 2013-12-04 DIAGNOSIS — M25551 Pain in right hip: Secondary | ICD-10-CM

## 2013-12-04 DIAGNOSIS — M1611 Unilateral primary osteoarthritis, right hip: Secondary | ICD-10-CM

## 2013-12-04 DIAGNOSIS — M25559 Pain in unspecified hip: Secondary | ICD-10-CM

## 2013-12-04 DIAGNOSIS — M169 Osteoarthritis of hip, unspecified: Secondary | ICD-10-CM | POA: Diagnosis not present

## 2013-12-04 DIAGNOSIS — I498 Other specified cardiac arrhythmias: Secondary | ICD-10-CM

## 2013-12-04 DIAGNOSIS — M1610 Unilateral primary osteoarthritis, unspecified hip: Secondary | ICD-10-CM | POA: Diagnosis not present

## 2013-12-04 DIAGNOSIS — M7061 Trochanteric bursitis, right hip: Secondary | ICD-10-CM

## 2013-12-04 LAB — TSH: TSH: 3.73 u[IU]/mL (ref 0.35–4.50)

## 2013-12-04 MED ORDER — LIDOCAINE 5 % EX PTCH: 1.0000 | MEDICATED_PATCH | CUTANEOUS | Status: DC

## 2013-12-04 MED ORDER — DICLOFENAC SODIUM 2 % TD SOLN: TRANSDERMAL | Status: DC

## 2013-12-04 NOTE — Patient Instructions (Addendum)
Very nice to meet you Ice 20 minutes 2 times daily. Usually after activity and before bed. Exercises 3-4 times a week.  Consider Vitamin D 2000 IU daily Turmeric 500mg  twice daily.  Xrays downstairs today Come back in 3-4 weeks.

## 2013-12-04 NOTE — Progress Notes (Signed)
[facility] [place], [place] [address] Phone: [phone] Subjective:    I'm seeing this patient by the request  of:  [name], MD   CC: Right hip pain  [id number] Troy Banks is a 73 y.o. male coming in with complaint of  right hip pain. Patient has been having this pain for months. Patient has not remember any injury. Patient states that even with regular activities of daily living now he is having pain such as getting in and out of a car onto his motorcycle. Patient states most of the pain seems to be on the lateral aspect of the hip but has some in the groin area as well. Patient states that it is even waking him up at night when he rolls onto his right side. Denies any significant back pain associated with it. Patient has tried over-the-counter medications without any significant improvement. Patient was the severity of 8/10. No radiation down the leg but may be associated with some mild weakness. No numbness noted. It is more of a dull aching sensation that can be very sharp with certain movements.     Past medical history, social, surgical and family history all reviewed in electronic medical record.   Review of Systems: No headache, visual changes, nausea, vomiting, diarrhea, constipation, dizziness, abdominal pain, skin rash, fevers, chills, night sweats, weight loss, swollen lymph nodes, body aches, joint swelling, muscle aches, chest pain, shortness of breath, mood changes.   Objective Blood pressure 138/88, pulse 44, height 5\' 8"  (1.727 m), weight 243 lb (110.224 kg), SpO2 97.00%.  General: No apparent distress alert and oriented x3 mood and affect normal, dressed appropriately.  HEENT: Pupils equal, extraocular movements intact  Respiratory: Patient's speak in full sentences and does not appear short of breath  Cardiovascular: No lower extremity edema, non tender, no erythema  Skin: Warm dry intact with no signs of  infection or rash on extremities or on axial skeleton.  Abdomen: Soft nontender  Neuro: Cranial nerves II through XII are intact, neurovascularly intact in all extremities with 2+ DTRs and 2+ pulses.  Lymph: No lymphadenopathy of posterior or anterior cervical chain or axillae bilaterally.  Gait normal with good balance and coordination.  MSK:  Non tender with full range of motion and good stability and symmetric strength and tone of shoulders, elbows, wrist,  knee and ankles bilaterally.  Hip: Right ROM IR: 15 Deg, ER: 35 Deg, Flexion: 100 Deg, Extension: 80 Deg, Abduction: 45 Deg, Adduction: 45 Deg Strength IR: 4/5, ER: 5/5, Flexion: 4/5, Extension: 5/5, Abduction: 4/5, Adduction: 5/5 Pelvic alignment unremarkable to inspection and palpation. Standing hip rotation and gait without trendelenburg sign / unsteadiness. Greater trochanter with severe tenderness to palpation. No tenderness over piriformis. Positive pain with FABER or FADIR. No SI joint tenderness and normal minimal SI movement.  MSK US performed of: Right hip This study was ordered, performed, and interpreted by Charlann Boxer D.O.  Hip: Significant trochanteric bursitis noted. Acetabular labrum visualized with what appears to be some degenerative changes. Patient also has moderate to severe narrowing of the hip joint itself.. Femoral neck appears unremarkable without increased power doppler signal along Cortex.  IMPRESSION:  Moderate to severe osteo-arthritis with greater trochanteric bursitis of the right hip   Procedure: Real-time Ultrasound Guided Injection of right greater trochanteric bursitis secondary to patient's body habitus Device: GE Logiq E  Ultrasound guided injection is preferred based studies that show increased duration, increased effect, greater accuracy,  decreased procedural pain, increased response rate, and decreased cost with ultrasound guided versus blind injection.  Verbal informed consent obtained.    Time-out conducted.  Noted no overlying erythema, induration, or other signs of local infection.  Skin prepped in a sterile fashion.  Local anesthesia: Topical Ethyl chloride.  With sterile technique and under real time ultrasound guidance:  Greater trochanteric area was visualized and patient's bursa was noted. A 22-gauge 3 inch needle was inserted and 4 cc of 0.5% Marcaine and 1 cc of Kenalog 40 mg/dL was injected. Pictures taken Completed without difficulty  Pain immediately resolved suggesting accurate placement of the medication.  Advised to call if fevers/chills, erythema, induration, drainage, or persistent bleeding.  Images permanently stored and available for review in the ultrasound unit.  Impression: Technically successful ultrasound guided injection.    Impression and Recommendations:     This case required medical decision making of moderate complexity.

## 2013-12-04 NOTE — Assessment & Plan Note (Signed)
X-rays were ordered reviewed and interpreted by me today. Patient's x-ray show that patient has moderate to severe osteophytic changes with the right being greater than left. I do believe the patient's pain for significant amount is coming from an intra-articular arthritis. Patient is try these exercises as well as home modalities and we'll see if we make any improvement. Patient continues to have pain we should consider an intra-articular injection of the hip for diagnostic as well as hopefully therapeutic purposes. Patient likely within the next 5-7 years we'll be looking at a hip replacement.

## 2013-12-04 NOTE — Progress Notes (Signed)
Subjective:    Patient ID: Troy Banks, male    DOB: Sep 28, 1940, 73 y.o.   MRN: [id number]  HPI  Patient here for follow up Reviewed chronic medical issues and interval medical events  Past Medical History  Diagnosis Date  . COLONIC POLYPS, HX OF   . DIABETES MELLITUS     diet controlled  . DIVERTICULOSIS, COLON   . GOUT   . NEPHROLITHIASIS, HX OF     remote open stone-retrieval on L  . HYPERTENSION     off ACEI 2010 because of hyperkalemia in setting of ARI  . OSA on CPAP   . Cervical stenosis of spinal canal     fusion 2006    Review of Systems  Constitutional: Negative for fever.  Respiratory: Negative for cough and shortness of breath.   Cardiovascular: Negative for chest pain, palpitations and leg swelling.  Musculoskeletal: Positive for arthralgias (B "hip" pan - R lateral side, and B groin). Negative for joint swelling.  Neurological: Negative for dizziness, speech difficulty and light-headedness.       Objective:   Physical Exam  BP 138/88  Pulse 44  Temp(Src) 97.6 F (36.4 C) (Oral)  Wt 243 lb (110.224 kg)  SpO2 97% Wt Readings from Last 3 Encounters:  12/04/13 243 lb (110.224 kg)  10/24/13 246 lb (111.585 kg)  07/08/09 262 lb (118.842 kg)   Constitutional: he appears well-developed and well-nourished. No distress.  Neck: Normal range of motion. Neck supple. No JVD present. No thyromegaly present.  Cardiovascular: Normal rate, regular rhythm and normal heart sounds.  No murmur heard. No BLE edema. Pulmonary/Chest: Effort normal and breath sounds normal. No respiratory distress. he has no wheezes.  Psychiatric: he has a normal mood and affect. His behavior is normal. Judgment and thought content normal.   Lab Results  Component Value Date   WBC 7.4 10/24/2013   HGB 16.4 10/24/2013   HCT 46.9 10/24/2013   PLT 149.0* 10/24/2013   GLUCOSE 108* 10/24/2013   CHOL 183 10/24/2013   TRIG 292.0* 10/24/2013   HDL 33.80* 10/24/2013   LDLDIRECT 130.2  07/08/2009   LDLCALC 91 10/24/2013   ALT 42 10/24/2013   AST 31 10/24/2013   NA 140 10/24/2013   K 4.9 10/24/2013   CL 105 10/24/2013   CREATININE 1.3 10/24/2013   BUN 17 10/24/2013   CO2 28 10/24/2013   TSH 3.224 * 12/24/2008   HGBA1C 6.5 10/24/2013   MICROALBUR 0.3 01/02/2009    No results found.     Assessment & Plan:   Unintentional weight loss. Reports 20 pound loss in past 6 months. Historically reports 268# in 05/2013, now #243 12/04/2013 -check TSH and reviewed 10/2013 screening labs (unremarkable). Denies localizing abdominal symptoms -reviewed need for additional evaluation depending on weight trends and initial lab review, the patient adamant and reluctant to pursue aggressive intervention or evaluation  Lateral right hip pain, symptoms consistent with trochanteric bursitis. Patient reluctant for various medications including terminal NSAIDs because of P. For 50 tablets and concerns. Patient willing to try lidocaine and will refer to sports medicine for consideration of resection versus other eval for potential causes of pain  Problem List Items Addressed This Visit   Bradycardia     Stable x 6 weeks, asymptomatic  significant other who is RN reports chronic bradycardia in past 6 months (HR<50)  Unchanged with DC of ACE inhibitor 10/2013 - beta-blocker dose unchanged ECG 10/24/13 with marked sinus brady @43bpm , no ischemic change or  heart block denies syncope, near syncope, dizziness or dyspnea pending cardiology evaluation, ? consideration of pacemaker, but patient feels "asymptomatic"    Relevant Orders      TSH    Other Visit Diagnoses   Loss of weight    -  Primary    Relevant Orders       TSH    Trochanteric bursitis of right hip

## 2013-12-04 NOTE — Progress Notes (Signed)
Pre visit review using our clinic review tool, if applicable. No additional management support is needed unless otherwise documented below in the visit note. 

## 2013-12-04 NOTE — Patient Instructions (Signed)
It was good to see you today.  We have reviewed your prior records including labs and tests today  Test(s) ordered today. Your results will be released to [place] (or called to you) after review, usually within 72hours after test completion. If any changes need to be made, you will be notified at that same time.  Medications reviewed and updated Okay for lidocaine patch to treat lateral hip pain, no other changes recommended at this time. Your prescription(s) have been submitted to your pharmacy. Please take as directed and contact our office if you believe you are having problem(s) with the medication(s).  we'll make referral to Dr Gardenia Phlegm for your hip pain symptoms. Our office will contact you regarding appointment(s) once made.  Please schedule followup in 4-6 months to monitor BP and diabetes mellitus, call sooner if problems.

## 2013-12-04 NOTE — Assessment & Plan Note (Signed)
Stable x 6 weeks, asymptomatic  significant other who is RN reports chronic bradycardia in past 6 months (HR<50)  Unchanged with DC of ACE inhibitor 10/2013 - beta-blocker dose unchanged ECG 10/24/13 with marked sinus brady @43bpm , no ischemic change or heart block denies syncope, near syncope, dizziness or dyspnea pending cardiology evaluation, ? consideration of pacemaker, but patient feels "asymptomatic"

## 2013-12-04 NOTE — Assessment & Plan Note (Signed)
The patient did have injection today and with near complete resolution of the lateral hip pain. Patient is to do a home exercise program and was given a handout for this today, and we discussed over-the-counter medications as well as topical medications and prescription was prescribed. We also discussed icing protocol. Patient will try these interventions and come back again in 3-4 weeks for further evaluation.

## 2013-12-31 ENCOUNTER — Ambulatory Visit (INDEPENDENT_AMBULATORY_CARE_PROVIDER_SITE_OTHER): Payer: Medicare Other | Admitting: Cardiology

## 2013-12-31 ENCOUNTER — Encounter: Payer: Self-pay | Admitting: Cardiology

## 2013-12-31 VITALS — BP 190/90 | HR 44 | Ht 68.0 in | Wt 236.1 lb

## 2013-12-31 DIAGNOSIS — I359 Nonrheumatic aortic valve disorder, unspecified: Secondary | ICD-10-CM

## 2013-12-31 DIAGNOSIS — I119 Hypertensive heart disease without heart failure: Secondary | ICD-10-CM

## 2013-12-31 DIAGNOSIS — I498 Other specified cardiac arrhythmias: Secondary | ICD-10-CM | POA: Diagnosis not present

## 2013-12-31 DIAGNOSIS — R001 Bradycardia, unspecified: Secondary | ICD-10-CM | POA: Insufficient documentation

## 2013-12-31 MED ORDER — HYDRALAZINE HCL 25 MG PO TABS: 25.0000 mg | ORAL_TABLET | Freq: Two times a day (BID) | ORAL | Status: DC

## 2013-12-31 NOTE — Patient Instructions (Addendum)
DECREASE YOUR LOPRESSOR (METOPROLOL) TO 25 MG TWICE A DAY  START APRESOLINE 25 MG TWICE A DAY.  Follow up with Dr Asa Lente in 4-6 weeks  Your physician wants you to follow-up in: [place] will receive a reminder letter in the mail two months in advance. If you don't receive a letter, please call our office to schedule the follow-up appointment.   Your physician has requested that you have an echocardiogram. Echocardiography is a painless test that uses sound waves to create images of your heart. It provides your doctor with information about the size and shape of your heart and how well your heart's chambers and valves are working. This procedure takes approximately one hour. There are no restrictions for this procedure. SINCE YOU HAVE DECIDED TO NOT HAVE AT THIS TIME, CALL THE OFFICE AT [phone] WHEN YOU WOULD LIKE TO SCHEDULE.

## 2013-12-31 NOTE — Progress Notes (Signed)
Troy Banks Date of Birth:  Aug 19, 1940 [facility] [address] [place], [place]  [address] [phone]        Fax   [phone]   History of Present Illness: This pleasant 73 year old gentleman is seen by me for the first time today.  He is seen at the request of Dr. Asa Lente in regard to sinus bradycardia.  The patient states that he has had a slow pulse for the past several years.  EKG today shows sinus bradycardia at a rate of 44.  The patient does not have any history of chest pain.  He states that he is not having any symptoms from his slow pulse.  Specifically he denies any symptoms of congestive heart failure such as orthopnea paroxysmal nocturnal dyspnea or peripheral edema.  He has not had any dizziness or syncope.  He does complain of feeling fatigued which may be a manifestation of a slow pulse.  The patient has a history of labile hypertension and white coat hypertension.  He states that at home his blood pressure is usually in the range of 138/80 The patient has a long history of high blood pressure.  Several years ago he was admitted with hyperkalemia secondary to lisinopril. The patient has a past history of a heart murmur.  His last echocardiogram was 12/25/08 and at that time showed an ejection fraction of 60-65% with grade 1 diastolic dysfunction and there was aortic valve sclerosis without stenosis.   The patient has a history of obstructive sleep apnea and uses a CPAP machine.  Current Outpatient Prescriptions  Medication Sig Dispense Refill  . allopurinol (ZYLOPRIM) 100 MG tablet Take 100 mg by mouth 2 (two) times daily.      [name] amLODipine (NORVASC) 10 MG tablet Take 10 mg by mouth daily.      . Cholecalciferol (VITAMIN D3) 2000 UNITS TABS Take 2,000 Units by mouth daily.      [name] dicyclomine (BENTYL) 10 MG capsule Take 10 mg by mouth 4 (four) times daily as needed for spasms.      . furosemide (LASIX) 40 MG tablet Take 40 mg by mouth as needed.       . metoprolol (LOPRESSOR) 50 MG tablet Take 50 mg by mouth as directed. 1/2 TABLET TWICE A DAY      . NON FORMULARY 2 (two) times daily. tumeric      . omeprazole (PRILOSEC) 20 MG capsule Take 20 mg by mouth daily.      . tamsulosin (FLOMAX) 0.4 MG CAPS capsule Take 0.4 mg by mouth daily.       . hydrALAZINE (APRESOLINE) 25 MG tablet Take 1 tablet (25 mg total) by mouth 2 (two) times daily.  60 tablet  11  . oxybutynin (DITROPAN) 5 MG tablet Take by mouth daily.       No current facility-administered medications for this visit.    Allergies  Allergen Reactions  . Benzodiazepines     Patient states he gets stroke symptoms with benzo's.   . Diazepam   . Sertraline Hcl     Pt states all meds that are broken down with C-450- if so have sxs of stroke  . Tramadol Other (See Comments)    Unable to take 450    Patient Active Problem List   Diagnosis Date Noted  . Aortic valve disease 12/31/2013  . Sinus bradycardia 12/31/2013  . Greater trochanteric bursitis of right hip 12/04/2013  . Arthritis of right hip  12/04/2013  . Bradycardia 10/24/2013  . OSA on CPAP   . DIABETES MELLITUS 07/08/2009  . COLONIC POLYPS, HX OF 12/31/2008  . GOUT 12/22/2008  . HYPERTENSION 12/22/2008  . DIVERTICULOSIS, COLON 12/22/2008  . NEPHROLITHIASIS, HX OF 12/22/2008    History  Smoking status  . Former Smoker  Smokeless tobacco  . Not on file    Comment: widowed 1991 - lives with SO [name] who is RN    History  Alcohol Use  . Yes    Family History  Problem Relation Age of Onset  . Heart disease Mother   . Hypertension Mother   . Heart disease Father   . Hypertension Father   . Prostate cancer Maternal Uncle   . Hypertension Maternal Grandmother   . Heart disease Maternal Grandmother   . Hypertension Maternal Grandfather   . Heart disease Maternal Grandfather   . Hypertension Paternal Grandfather   . Heart disease Paternal Grandfather   . Diabetes Paternal Grandfather   . Alcohol  abuse Other   . Arthritis Other   . Diabetes Mother     Review of Systems: Constitutional: no fever chills diaphoresis or fatigue or change in weight.  Complains of lack of energy Head and neck: no hearing loss, no epistaxis, no photophobia or visual disturbance. Respiratory: No cough, shortness of breath or wheezing. Cardiovascular: No chest pain peripheral edema, palpitations. Gastrointestinal: No abdominal distention, no abdominal pain, no change in bowel habits hematochezia or melena. Genitourinary: No dysuria, no frequency, no urgency, no nocturia. Musculoskeletal:No arthralgias, no back pain, no gait disturbance or myalgias. Neurological: No dizziness, no headaches, no numbness, no seizures, no syncope, no weakness, no tremors. Hematologic: No lymphadenopathy, no easy bruising. Psychiatric: No confusion, no hallucinations, no sleep disturbance.    Physical Exam: Filed Vitals:   12/31/13 1029  BP: 190/90  Pulse:    the general appearance reveals a stocky middle-aged gentleman in no distress.The head and neck exam reveals pupils equal and reactive.  Extraocular movements are full.  There is no scleral icterus.  The mouth and pharynx are normal.  The neck is supple.  The carotids reveal no bruits transmitted from the base of the heart..  The jugular venous pressure is normal.  The  thyroid is not enlarged.  There is no lymphadenopathy.  The chest is clear to percussion and auscultation.  There are no rales or rhonchi.  Expansion of the chest is symmetrical.  The precordium is quiet.  The first heart sound is normal.  The second heart sound is physiologically split.  There is grade 2/6 harsh systolic ejection murmur at the base radiating toward the neck.  There is no abnormal lift or heave.  The abdomen is soft and nontender.  The bowel sounds are normal.  The liver and spleen are not enlarged.  There are no abdominal masses.  There are no abdominal bruits.  Extremities reveal good pedal  pulses.  There is no phlebitis or edema.  There is no cyanosis or clubbing.  Strength is normal and symmetrical in all extremities.  There is no lateralizing weakness.  There are no sensory deficits.  The skin is warm and dry.  There is no rash.  EKG shows sinus bradycardia at 44 per minute.  No ischemic changes.  The PR interval is borderline at 204 ms  Assessment / Plan: 1. marked sinus bradycardia, essentially asymptomatic.  Presently on Lopressor 50 mg twice a day 2. aortic valve disease with murmur of aortic stenosis, uncertain  severity 3. hypertensive cardiovascular disease with prior intolerance to ACE inhibitor which caused severe hyperkalemia.  Recommendation: At this point I suggested that he cut back on the dose of his Lopressor to just 25 mg twice a day to allow a faster heart rate.  This in turn might give him a sense of more energy and less fatigue. I suggested that we update his two-dimensional echocardiogram in regard to his aortic stenosis murmur.  Although he initially scheduled this, he subsequently decided not to have that test. He stated that he would never undergo any type of heart surgery nor would he ever consider having a pacemaker. As his beta blocker is reduced I suggested that we add hydralazine 25 mg twice a day to his regimen for blood pressure control. I have asked him to make a followup appointment with Dr. Gracelyn Nurse per about 4-6 weeks to see how his blood pressure is responding to these changes.  I've asked him to let me check him again for his aortic valve in about a year for an office visit and EKG. Many thanks for the opportunity to see this pleasant gentleman with you.

## 2014-01-01 ENCOUNTER — Ambulatory Visit (INDEPENDENT_AMBULATORY_CARE_PROVIDER_SITE_OTHER): Payer: Medicare Other | Admitting: Family Medicine

## 2014-01-01 ENCOUNTER — Ambulatory Visit (INDEPENDENT_AMBULATORY_CARE_PROVIDER_SITE_OTHER)
Admission: RE | Admit: 2014-01-01 | Discharge: 2014-01-01 | Disposition: A | Discharge status: Home / Self Care | Payer: Medicare Other | Source: Ambulatory Visit | Attending: Family Medicine | Admitting: Family Medicine

## 2014-01-01 ENCOUNTER — Encounter: Payer: Self-pay | Admitting: Family Medicine

## 2014-01-01 VITALS — BP 172/88 | HR 51 | Ht 68.0 in | Wt 241.0 lb

## 2014-01-01 DIAGNOSIS — M2420 Disorder of ligament, unspecified site: Secondary | ICD-10-CM | POA: Diagnosis not present

## 2014-01-01 DIAGNOSIS — M545 Low back pain, unspecified: Secondary | ICD-10-CM

## 2014-01-01 DIAGNOSIS — M629 Disorder of muscle, unspecified: Secondary | ICD-10-CM | POA: Diagnosis not present

## 2014-01-01 DIAGNOSIS — M7918 Myalgia, other site: Secondary | ICD-10-CM | POA: Insufficient documentation

## 2014-01-01 DIAGNOSIS — Q762 Congenital spondylolisthesis: Secondary | ICD-10-CM

## 2014-01-01 DIAGNOSIS — M4316 Spondylolisthesis, lumbar region: Secondary | ICD-10-CM | POA: Insufficient documentation

## 2014-01-01 DIAGNOSIS — M4310 Spondylolisthesis, site unspecified: Secondary | ICD-10-CM | POA: Diagnosis not present

## 2014-01-01 DIAGNOSIS — M7632 Iliotibial band syndrome, left leg: Secondary | ICD-10-CM | POA: Insufficient documentation

## 2014-01-01 DIAGNOSIS — M47817 Spondylosis without myelopathy or radiculopathy, lumbosacral region: Secondary | ICD-10-CM | POA: Diagnosis not present

## 2014-01-01 MED ORDER — DICLOFENAC SODIUM 2 % TD SOLN: TRANSDERMAL | Status: DC

## 2014-01-01 NOTE — Progress Notes (Signed)
  [facility] [address] [place],  [address] Phone: [phone] Subjective:    CC: Left hip pain  NOM:[id number] TITAN KARNER is a 73 y.o. male coming in with complaint of left hip pain. Patient previously for right hip pain and was diagnosed with greater trochanteric bursitis. Patient was given an injection pain completely resolved. Patient has started having similar symptoms on the left side. Patient states it is somewhat different because it is going more over the anterior lateral aspect the leg and going down towards his knee. Patient denies any back pain out of the ordinary but does have back pain a regular basis. Patient describes his pain more as a dull aching sensation that is worse when he goes from a seated to standing position. Patient states after walking for some time and seems to improve. States that the pain seems to be moving further and further down his leg out once again on the lateral aspect of the leg. Denies any buttocks pain, denies any worsening pain with extension of the back. Denies any fevers or chills or any abnormal weight loss.     Past medical history, social, surgical and family history all reviewed in electronic medical record.   Review of Systems: No headache, visual changes, nausea, vomiting, diarrhea, constipation, dizziness, abdominal pain, skin rash, fevers, chills, night sweats, weight loss, swollen lymph nodes, body aches, joint swelling, muscle aches, chest pain, shortness of breath, mood changes.   Objective Blood pressure 172/88, pulse 51, height 5\' 8"  (1.727 m), weight 241 lb (109.317 kg), SpO2 95.00%.  General: No apparent distress alert and oriented x3 mood and affect normal, dressed appropriately. Benign tremor of the left upper extremity noted. HEENT: Pupils equal, extraocular movements intact  Respiratory: Patient's speak in full sentences and does not appear short of breath  Cardiovascular: No lower  extremity edema, non tender, no erythema  Skin: Warm dry intact with no signs of infection or rash on extremities or on axial skeleton.  Abdomen: Soft nontender  Neuro: Cranial nerves II through XII are intact, neurovascularly intact in all extremities with 2+ DTRs and 2+ pulses.  Lymph: No lymphadenopathy of posterior or anterior cervical chain or axillae bilaterally.  Gait normal with good balance and coordination.  MSK:  Non tender with full range of motion and good stability and symmetric strength and tone of shoulders, elbows, wrist,  knee and ankles bilaterally.  Hip: Left ROM IR: 25 Deg, ER: 35 Deg, Flexion: 100 Deg, Extension: 80 Deg, Abduction: 45 Deg, Adduction: 45 Deg Strength IR: 4/5, ER: 5/5, Flexion: 4/5, Extension: 5/5, Abduction: 4/5, Adduction: 5/5 Pelvic alignment unremarkable to inspection and palpation. Standing hip rotation and gait without trendelenburg sign / unsteadiness. Greater trochanter with nontender on exam No tenderness over piriformis. Patient is tender over the iliotibial band Positive pain with FABER or FADIR. No SI joint tenderness and normal minimal SI movement. Negative straight leg test  X-rays the patient's lumbar spine were ordered reviewed and interpreted by me today. Patient has a spondylolisthesis of L4-L5 with 8 mm of anterior subluxation of L4 on L5.  Impression and Recommendations:     This case required medical decision making of moderate complexity.

## 2014-01-01 NOTE — Assessment & Plan Note (Signed)
The patient's physical exam is more corresponding to iliotibial band syndrome. We discussed home exercises, icing protocol, as well as strengthening of the hip abductors which will be the most beneficial. Patient was given a prescription for topical anti-inflammatories which I think will be helpful. Patient is to avoid significant amount of oral anti-inflammatories secondary to certain metabolism to medications. Patient does not make any significant improvement back pain does improve than what I would consider is injection of the greater trochanteric area.  *Differential also includes lumbar radiculopathy. X-rays ordered today and patient does have spondylolisthesis of L4 and L5 that could be giving him an L4 or L5 nerve root impingement. We will continue to monitor. Patient needed to stay away from prednisone once again secondary to metabolism. If pain does not seem to improve patient back in 2 weeks and we need to consider further imaging of the back such as an MRI to evaluate for nerve impingement.  Spent greater than 25 minutes with patient face-to-face and had greater than 50% of counseling including as described above in assessment and plan.

## 2014-01-01 NOTE — Patient Instructions (Addendum)
Good to see you Exercises 3 times a week and alternate with hip and back.  Pennsaid twice daily. Watch for side effects.  Ice is your friend.  20 minutes 2 times a day Xrays downstairs today.  Come back in 2-3 weeks.  If still in pain we will ultrasound and consider injection if I can find an area.

## 2014-01-22 ENCOUNTER — Encounter: Payer: Self-pay | Admitting: Family Medicine

## 2014-01-22 ENCOUNTER — Ambulatory Visit (INDEPENDENT_AMBULATORY_CARE_PROVIDER_SITE_OTHER): Payer: Medicare Other | Admitting: Family Medicine

## 2014-01-22 VITALS — BP 140/76 | HR 49 | Ht 68.0 in | Wt 242.0 lb

## 2014-01-22 DIAGNOSIS — M4316 Spondylolisthesis, lumbar region: Secondary | ICD-10-CM

## 2014-01-22 DIAGNOSIS — Q762 Congenital spondylolisthesis: Secondary | ICD-10-CM | POA: Diagnosis not present

## 2014-01-22 NOTE — Assessment & Plan Note (Signed)
I do believe that patient's back pain is likely contributing to his leg pain. Patient was very happy with the results at this time and is able to do her daily activities. We discussed continuing the over-the-counter supplementation and as well as the home exercises 2-3 times a week indefinitely. Patient is very happy at this time and was given a trial some anti-inflammatories topically could be beneficial. Patient's insurance will not pay for this. Patient was given other exercises to work on core strengthening him to avoid excessive flexion or extension of the back. The discussed proper lifting techniques as well as other activities to help him with course strength as well as limiting the amount of abuse on his joints. Patient and will come back and see me again in 6 weeks for further evaluation and treatment. Spent greater than 25 minutes with patient face-to-face and had greater than 50% of counseling including as described above in assessment and plan.

## 2014-01-22 NOTE — Progress Notes (Signed)
  [facility] [address] [place], [place] [address] Phone: [phone] Subjective:    CC: Left hip pain  Troy Banks is a 73 y.o. male coming in with complaint of left hip pain. The patient was treated for the potential of iliotibial band syndrome on the left side with mild greater trochanteric bursitis. Patient was having more radicular symptoms going down the lateral aspect of the leg. We did get x-rays the patient's back that did show anterior listhesis at L4-L5 of an 8 mm anterior subluxation. Patient was given home exercises, icing protocol, as well as over-the-counter supplementations. Patient states that he is already 75% better. Patient has been feeling good overall. States that he only has some mild discomfort usually at the end of the day they cause more of a cramping sensation in his anterior thigh. Denies any weakness. Denies any bowel or bladder overall very happy with the results.     Past medical history, social, surgical and family history all reviewed in electronic medical record.   Review of Systems: No headache, visual changes, nausea, vomiting, diarrhea, constipation, dizziness, abdominal pain, skin rash, fevers, chills, night sweats, weight loss, swollen lymph nodes, body aches, joint swelling, muscle aches, chest pain, shortness of breath, mood changes.   Objective Blood pressure 140/76, pulse 49, height 5\' 8"  (1.727 m), weight 242 lb (109.77 kg), SpO2 96.00%.  General: No apparent distress alert and oriented x3 mood and affect normal, dressed appropriately. Benign tremor of the left upper extremity noted. HEENT: Pupils equal, extraocular movements intact  Respiratory: Patient's speak in full sentences and does not appear short of breath  Cardiovascular: No lower extremity edema, non tender, no erythema  Skin: Warm dry intact with no signs of infection or rash on extremities or on axial skeleton.  Abdomen: Soft  nontender  Neuro: Cranial nerves II through XII are intact, neurovascularly intact in all extremities with 2+ DTRs and 2+ pulses.  Lymph: No lymphadenopathy of posterior or anterior cervical chain or axillae bilaterally.  Gait normal with good balance and coordination.  MSK:  Non tender with full range of motion and good stability and symmetric strength and tone of shoulders, elbows, wrist,  knee and ankles bilaterally.  Back Exam:  Inspection: Unremarkable  Motion: Flexion 35 deg, Extension 35 deg, Side Bending to 35 deg bilaterally,  Rotation to 35 deg bilaterally  SLR laying: Negative  XSLR laying: Negative  Palpable tenderness: None. FABER: negative. Sensory change: Gross sensation intact to all lumbar and sacral dermatomes.  Reflexes: 2+ at both patellar tendons, 2+ at achilles tendons, Babinski's downgoing.  Strength at foot  Plantar-flexion: 5/5 Dorsi-flexion: 5/5 Eversion: 5/5 Inversion: 5/5  Leg strength  Quad: 5/5 Hamstring: 5/5 Hip flexor: 5/5 Hip abductors: 5/5  Gait unremarkable.  Hip: Left ROM IR: 25 Deg, ER: 35 Deg, Flexion: 100 Deg, Extension: 80 Deg, Abduction: 45 Deg, Adduction: 45 Deg Strength IR: 4/5, ER: 5/5, Flexion: 4/5, Extension: 5/5, Abduction: 4/5, Adduction: 5/5 Pelvic alignment unremarkable to inspection and palpation. Standing hip rotation and gait without trendelenburg sign / unsteadiness. Greater trochanter nontender No tenderness over piriformis. Patient is tender over the iliotibial band Continued stiffness FABER and FADIR but no. No SI joint tenderness and normal minimal SI movement. Negative straight leg test   Impression and Recommendations:     This case required medical decision making of moderate complexity.

## 2014-01-22 NOTE — Patient Instructions (Signed)
Good to see you I love the idea of a bike.  Continue exercises 2-3 times a week.  Ice is your friend Try the pennsaid and anytime you need it call us.  Continue the Vit D and turmeric but start the B12 at 1074mcg daily.  Come back in 6 weeks.

## 2014-02-13 ENCOUNTER — Ambulatory Visit: Payer: Medicare Other | Admitting: Internal Medicine

## 2014-02-14 ENCOUNTER — Telehealth: Payer: Self-pay

## 2014-02-14 NOTE — Telephone Encounter (Signed)
Pt stated that bp diary lists bp readings in the 130's over 80's.

## 2014-03-05 ENCOUNTER — Ambulatory Visit (INDEPENDENT_AMBULATORY_CARE_PROVIDER_SITE_OTHER): Payer: Medicare Other | Admitting: Family Medicine

## 2014-03-05 ENCOUNTER — Telehealth: Payer: Self-pay | Admitting: *Deleted

## 2014-03-05 ENCOUNTER — Encounter: Payer: Self-pay | Admitting: Family Medicine

## 2014-03-05 VITALS — BP 178/80 | HR 55 | Temp 98.5°F | Wt 243.8 lb

## 2014-03-05 DIAGNOSIS — Q762 Congenital spondylolisthesis: Secondary | ICD-10-CM

## 2014-03-05 DIAGNOSIS — M4316 Spondylolisthesis, lumbar region: Secondary | ICD-10-CM

## 2014-03-05 DIAGNOSIS — M2550 Pain in unspecified joint: Secondary | ICD-10-CM | POA: Diagnosis not present

## 2014-03-05 MED ORDER — CELECOXIB 200 MG PO CAPS: ORAL_CAPSULE | ORAL | Status: DC

## 2014-03-05 MED ORDER — MELOXICAM 15 MG PO TABS: 15.0000 mg | ORAL_TABLET | Freq: Every day | ORAL | Status: DC

## 2014-03-05 MED ORDER — GABAPENTIN 100 MG PO CAPS: 100.0000 mg | ORAL_CAPSULE | Freq: Every day | ORAL | Status: DC

## 2014-03-05 NOTE — Patient Instructions (Addendum)
Good to see you.  Exercises 3 times a week.  Stop the ibuprofen.  Start the meloxicam daily  Gabapentin 100mg  at night.  Get on that bike, I think it will be great.  You are doing great, lets check in in 4 weeks.

## 2014-03-05 NOTE — Assessment & Plan Note (Signed)
Patient does have multiple aches and pain secondary to arthritis of multiple joints I think. Patient will continue with the conservative therapy and continued to stay very active. We discussed proper lifting techniques and watching patient's weight management. Patient is going to continue to go to the gym whenever possible. Medications per orders. Patient and will follow up with me again in 4 weeks for further evaluation with the changes in medicine.

## 2014-03-05 NOTE — Telephone Encounter (Signed)
Kassie left msg on triage on triage stating received e-script for meloxicam, but the pt has had a severe allergic reaction to meloxicam in the past. Wanting to see if md would switch to celebrex...[name]

## 2014-03-05 NOTE — Telephone Encounter (Signed)
Called pt no answer LMOM with changing on medications...Troy Banks

## 2014-03-05 NOTE — Telephone Encounter (Signed)
Done please call patient and told him I sent it in.

## 2014-03-05 NOTE — Progress Notes (Signed)
Pre visit review using our clinic review tool, if applicable. No additional management support is needed unless otherwise documented below in the visit note. 

## 2014-03-05 NOTE — Assessment & Plan Note (Signed)
Patient continues to do remarkably well with conservative therapy. Patient is having other arthralgias we didn't different changes to some of his medications. We are avoiding some certain medications. We are going low dose of an anti-inflammatory as well as gabapentin. We'll see if this helps with some of his daily aches and pains. Patient will continue the over-the-counter medicines, home exercises and patient is going to start cycling on a regular basis. Patient will come back and see me again in 4 weeks to make sure there is no side effects any of the medications.

## 2014-03-05 NOTE — Progress Notes (Signed)
[facility] [place], [place] [address] Phone: [phone] Subjective:    CC: Left hip pain  TTS:[id number] Troy Banks is a 73 y.o. male coming in with complaint of left hip pain. The patient was treated for the potential of iliotibial band syndrome on the left side with mild greater trochanteric bursitis. Patient was having more radicular symptoms going down the lateral aspect of the leg. We did get x-rays the patient's back that did show anterior listhesis at L4-L5 of an 8 mm anterior subluxation. Patient was given home exercises, icing protocol, as well as over-the-counter supplementations.  Patient has continued on conservative therapy. Patient states overall his back seems to be doing relatively well. Patient has had a history of spinal stenosis of the cervical spine and has no some mild increasing joint pain in his neck as well as his hands bilaterally but otherwise doing relatively well. Denies any numbness or tingling other than his regular daily occurrence is secondary to his neck pathology he states. Patient states though that he is able do all activities of daily living fairly well. Patient though continues to take ibuprofen on a regular basis. Patient states that it is a good day it really takes it 3 times a day. Denies any pain at night. Also denies any fevers or chills or any abnormal weight loss.     Past medical history, social, surgical and family history all reviewed in electronic medical record.   Review of Systems: No headache, visual changes, nausea, vomiting, diarrhea, constipation, dizziness, abdominal pain, skin rash, fevers, chills, night sweats, weight loss, swollen lymph nodes, body aches, joint swelling, muscle aches, chest pain, shortness of breath, mood changes.   Objective Blood pressure 178/80, pulse 55, temperature 98.5 F (36.9 C), temperature source Oral, weight 243 lb 12.8 oz (110.587 kg), SpO2 96.00%.  General:  No apparent distress alert and oriented x3 mood and affect normal, dressed appropriately. Benign tremor of the left upper extremity noted. HEENT: Pupils equal, extraocular movements intact  Respiratory: Patient's speak in full sentences and does not appear short of breath  Cardiovascular: No lower extremity edema, non tender, no erythema  Skin: Warm dry intact with no signs of infection or rash on extremities or on axial skeleton.  Abdomen: Soft nontender  Neuro: Cranial nerves II through XII are intact, neurovascularly intact in all extremities with 2+ DTRs and 2+ pulses.  Lymph: No lymphadenopathy of posterior or anterior cervical chain or axillae bilaterally.  Gait normal with good balance and coordination.  MSK:  Non tender with full range of motion and good stability and symmetric strength and tone of shoulders, elbows, wrist,  knee and ankles bilaterally.  Back Exam:  Inspection: Unremarkable  Motion: Flexion 35 deg, Extension 35 deg, Side Bending to 35 deg bilaterally,  Rotation to 35 deg bilaterally  SLR laying: Negative  XSLR laying: Negative  Palpable tenderness: None. FABER: negative. Sensory change: Gross sensation intact to all lumbar and sacral dermatomes.  Reflexes: 2+ at both patellar tendons, 2+ at achilles tendons, Babinski's downgoing.  Strength at foot  Plantar-flexion: 5/5 Dorsi-flexion: 5/5 Eversion: 5/5 Inversion: 5/5  Leg strength  Quad: 5/5 Hamstring: 5/5 Hip flexor: 5/5 Hip abductors: 5/5  Gait unremarkable. The patient actually has improved strength from previous exam. Hip: Left ROM IR: 25 Deg, ER: 35 Deg, Flexion: 100 Deg, Extension: 80 Deg, Abduction: 45 Deg, Adduction: 45 Deg Strength IR: 4/5, ER: 5/5, Flexion: 4/5, Extension: 5/5, Abduction: 4/5, Adduction: 5/5  Pelvic alignment unremarkable to inspection and palpation. Standing hip rotation and gait without trendelenburg sign / unsteadiness. Greater trochanter nontender No tenderness over  piriformis. Patient is tender over the iliotibial band Continued stiffness FABER and FADIR but no. No SI joint tenderness and normal minimal SI movement. Negative straight leg test No significant change from previous exam   Impression and Recommendations:     This case required medical decision making of moderate complexity.

## 2014-03-19 ENCOUNTER — Encounter: Payer: Self-pay | Admitting: Internal Medicine

## 2014-03-19 ENCOUNTER — Other Ambulatory Visit (INDEPENDENT_AMBULATORY_CARE_PROVIDER_SITE_OTHER): Payer: Medicare Other

## 2014-03-19 ENCOUNTER — Ambulatory Visit (INDEPENDENT_AMBULATORY_CARE_PROVIDER_SITE_OTHER): Payer: Medicare Other | Admitting: Internal Medicine

## 2014-03-19 VITALS — BP 140/80 | HR 47 | Temp 98.0°F | Ht 68.0 in | Wt 245.0 lb

## 2014-03-19 DIAGNOSIS — G250 Essential tremor: Secondary | ICD-10-CM

## 2014-03-19 DIAGNOSIS — I10 Essential (primary) hypertension: Secondary | ICD-10-CM

## 2014-03-19 DIAGNOSIS — E119 Type 2 diabetes mellitus without complications: Secondary | ICD-10-CM

## 2014-03-19 HISTORY — DX: Essential tremor: G25.0

## 2014-03-19 LAB — MICROALBUMIN / CREATININE URINE RATIO
Creatinine,U: 45.6 mg/dL
Microalb Creat Ratio: 2.4 mg/g (ref 0.0–30.0)
Microalb, Ur: 1.1 mg/dL (ref 0.0–1.9)

## 2014-03-19 LAB — BASIC METABOLIC PANEL
BUN: 19 mg/dL (ref 6–23)
CO2: 27 mEq/L (ref 19–32)
Calcium: 9.6 mg/dL (ref 8.4–10.5)
Chloride: 106 mEq/L (ref 96–112)
Creatinine, Ser: 1.3 mg/dL (ref 0.4–1.5)
GFR: 59.92 mL/min — ABNORMAL LOW (ref >60.00)
Glucose, Bld: 112 mg/dL — ABNORMAL HIGH (ref 70–99)
POTASSIUM: 4.5 meq/L (ref 3.5–5.1)
Sodium: 140 mEq/L (ref 135–145)

## 2014-03-19 LAB — HEMOGLOBIN A1C: Hgb A1c MFr Bld: 5.9 % (ref 4.6–6.5)

## 2014-03-19 NOTE — Assessment & Plan Note (Signed)
BP Readings from Last 3 Encounters:  03/19/14 140/80  03/05/14 178/80  01/22/14 140/76   Generally controlled, home BP log reviewed Not on ACE inhibitor because of 2010 hospitalization for acute renal insufficiency with hyperkalemia ( hospitalization for same reviewed) Chronic bradycardia, asymptomatic

## 2014-03-19 NOTE — Progress Notes (Signed)
Pre visit review using our clinic review tool, if applicable. No additional management support is needed unless otherwise documented below in the visit note. 

## 2014-03-19 NOTE — Progress Notes (Signed)
Subjective:    Patient ID: Troy Banks, male    DOB: 1940-10-30, 73 y.o.   MRN: [id number]  HPI  Patient here for follow up Reviewed chronic medical issues and interval medical events  Past Medical History  Diagnosis Date  . COLONIC POLYPS, HX OF   . DIABETES MELLITUS     diet controlled  . DIVERTICULOSIS, COLON   . GOUT   . NEPHROLITHIASIS, HX OF     remote open stone-retrieval on L  . HYPERTENSION     off ACEI 2010 because of hyperkalemia in setting of ARI  . OSA on CPAP   . Cervical stenosis of spinal canal     fusion 2006    Review of Systems  Constitutional: Negative for fever, fatigue and unexpected weight change.  Respiratory: Negative for cough and shortness of breath.   Cardiovascular: Negative for chest pain and leg swelling.  Musculoskeletal: Negative for gait problem.  Neurological: Positive for tremors (progressive hands over past 15y, worse with stress). Negative for dizziness, syncope and headaches.       Objective:   Physical Exam  BP 140/80  Pulse 47  Temp(Src) 98 F (36.7 C) (Oral)  Ht 5\' 8"  (1.727 m)  Wt 245 lb (111.131 kg)  BMI 37.26 kg/m2  SpO2 97% Wt Readings from Last 3 Encounters:  03/19/14 245 lb (111.131 kg)  03/05/14 243 lb 12.8 oz (110.587 kg)  01/22/14 242 lb (109.77 kg)   Constitutional: he appears well-developed and well-nourished. No distress. SO at side Neck: Normal range of motion. Neck supple. No JVD present. No thyromegaly present.  Cardiovascular: chronically bradicardia, regular rhythm and normal heart sounds.  No murmur heard. No BLE edema. Pulmonary/Chest: Effort normal and breath sounds normal. No respiratory distress. he has no wheezes.  Neuro: fine tremor B hands when extended - no voice, head or neck tremor noted today - Gait normal Psychiatric: he has a normal mood and affect. His behavior is normal. Judgment and thought content normal.   Lab Results  Component Value Date   WBC 7.4 10/24/2013   HGB 16.4  10/24/2013   HCT 46.9 10/24/2013   PLT 149.0* 10/24/2013   GLUCOSE 108* 10/24/2013   CHOL 183 10/24/2013   TRIG 292.0* 10/24/2013   HDL 33.80* 10/24/2013   LDLDIRECT 130.2 07/08/2009   LDLCALC 91 10/24/2013   ALT 42 10/24/2013   AST 31 10/24/2013   NA 140 10/24/2013   K 4.9 10/24/2013   CL 105 10/24/2013   CREATININE 1.3 10/24/2013   BUN 17 10/24/2013   CO2 28 10/24/2013   TSH 3.73 12/04/2013   HGBA1C 6.5 10/24/2013   MICROALBUR 0.3 01/02/2009    Dg Lumbar Spine Complete  01/01/2014   CLINICAL DATA:  Back pain.  EXAM: LUMBAR SPINE - COMPLETE 4+ VIEW  COMPARISON:  CT 12/22/2008.  FINDINGS: Right nephrolithiasis . Paraspinal soft tissues are unremarkable. Diffuse degenerative changes of the lumbar spine. Spondylolisthesis with 8 mm anterior subluxation L4 on L5. No acute bony abnormality .  IMPRESSION: 1. Right nephrolithiasis. 2. Spondylolisthesis L4-L5 with 8 mm anterior subluxation of L4 on L5. Diffuse degenerative changes lumbar spine.   Electronically Signed   By: Marcello Moores  Register   On: 01/01/2014 10:58       Assessment & Plan:   Problem List Items Addressed This Visit   Diet-controlled diabetes mellitus - Primary      Reports historically diet controlled Does not monitor home cbgs  Check A1c now, consider medication as  needed for a1c>7 Lab Results  Component Value Date   HGBA1C 6.5 10/24/2013   The patient is asked to make an attempt to improve diet and exercise patterns to aid in medical management of this problem.     Relevant Orders      Hemoglobin A1c      Basic metabolic panel      Microalbumin / creatinine urine ratio   Essential hypertension      BP Readings from Last 3 Encounters:  03/19/14 140/80  03/05/14 178/80  01/22/14 140/76   Generally controlled, home BP log reviewed Not on ACE inhibitor because of 2010 hospitalization for acute renal insufficiency with hyperkalemia ( hospitalization for same reviewed) Chronic bradycardia, asymptomatic     Familial tremor     B  hands, now alo affecting head and neck at times Progressive over past 15y per SO and pt Mom, sister and cousins with Hx same Unable to titrate beta-blocker due to chronic bradycardia Refer to neuro for eval and tx of same    Relevant Orders      Ambulatory referral to Neurology

## 2014-03-19 NOTE — Patient Instructions (Signed)
It was good to see you today.  We have reviewed your prior records including labs and tests today  Test(s) ordered today. Your results will be released to [place] (or called to you) after review, usually within 72hours after test completion. If any changes need to be made, you will be notified at that same time.  Medications reviewed and updated, no changes recommended at this time.  we'll make referral to Dr. Carles Collet for tremor eval and treatment . Our office will contact you regarding appointment(s) once made.  Please schedule followup in 6 months, call sooner if problems.

## 2014-03-19 NOTE — Assessment & Plan Note (Signed)
B hands, now alo affecting head and neck at times Progressive over past 15y per SO and pt Mom, sister and cousins with Hx same Unable to titrate beta-blocker due to chronic bradycardia Refer to neuro for eval and tx of same

## 2014-03-19 NOTE — Assessment & Plan Note (Signed)
Reports historically diet controlled Does not monitor home cbgs  Check A1c now, consider medication as needed for a1c>7 Lab Results  Component Value Date   HGBA1C 6.5 10/24/2013   The patient is asked to make an attempt to improve diet and exercise patterns to aid in medical management of this problem.

## 2014-03-21 ENCOUNTER — Other Ambulatory Visit: Payer: Self-pay

## 2014-03-31 ENCOUNTER — Encounter: Payer: Self-pay | Admitting: Neurology

## 2014-03-31 ENCOUNTER — Ambulatory Visit (INDEPENDENT_AMBULATORY_CARE_PROVIDER_SITE_OTHER): Payer: Medicare Other | Admitting: Neurology

## 2014-03-31 VITALS — BP 136/66 | HR 60 | Ht 68.5 in | Wt 243.0 lb

## 2014-03-31 DIAGNOSIS — N200 Calculus of kidney: Secondary | ICD-10-CM | POA: Diagnosis not present

## 2014-03-31 DIAGNOSIS — G250 Essential tremor: Secondary | ICD-10-CM | POA: Diagnosis not present

## 2014-03-31 MED ORDER — PRIMIDONE 50 MG PO TABS: 50.0000 mg | ORAL_TABLET | Freq: Every day | ORAL | Status: DC

## 2014-03-31 NOTE — Patient Instructions (Signed)
1. Take Primidone 50 mg - 1/2 tablet at night for 4 nights, then 1 tablet nightly.

## 2014-03-31 NOTE — Progress Notes (Signed)
Subjective:   Troy Banks was seen in consultation in the movement disorder clinic at the request of [name], MD.  The evaluation is for tremor.  The records that were made available to me were reviewed.  Pt is accompanied by his significant other who supplements the hx.    The patient is a 73 y.o. left handed male with a history of tremor.  Pt reports tremor for at least 30 years.  Pt has noted increasing tremor over the years. Tremor is mostly in the right hand, but it is in the left as well and more rarely in the head as well.  Stress brings out the tremor.   He has never been to a neurologist for tremor.   Is currently on small amount of metoprolol - 25 mg - but that cannot be increased due to hx of bradycardia.  There is a family hx of tremor in his mother, maternal GM, maternal sister and maternal cousins.    Affected by caffeine:  No. (only drinks 2 cups coffee per week; drinks green tea) Affected by alcohol:  Used to decrease tremor but doesn't seem to any longer Affected by stress:  Yes.   Affected by fatigue:  Yes.   Spills soup if on spoon:  No. but has trouble with corn/peas Spills glass of liquid if full:  Yes.   Affects ADL's (tying shoes, brushing teeth, etc):  Yes.   (slow to tie shoes)  Current/Previously tried tremor medications: neurontin - was up to 300 mg bid for rotator cuff issues but never seemed to help tremor; back on it at low dose for hip pain.    Outside reports reviewed: historical medical records and referral letter/letters.  Allergies  Allergen Reactions  . Benzodiazepines     Patient states he gets stroke symptoms with benzo's.   . Diazepam   . Mobic [Meloxicam]     Patient states he gets stroke symptoms with benzo's.   . Sertraline Hcl     Pt states all meds that are broken down with C-450- if so have sxs of stroke  . Tramadol Other (See Comments)    Unable to take 450    Outpatient Encounter Prescriptions as of 03/31/2014    Medication Sig  . allopurinol (ZYLOPRIM) 100 MG tablet Take 100 mg by mouth 2 (two) times daily.  [name] amLODipine (NORVASC) 10 MG tablet Take 10 mg by mouth daily.  . Cholecalciferol (VITAMIN D3) 2000 UNITS TABS Take 2,000 Units by mouth daily.  . Diclofenac Sodium 2 % SOLN Apply twice daily.  [name] dicyclomine (BENTYL) 10 MG capsule Take 10 mg by mouth 4 (four) times daily as needed for spasms.  . furosemide (LASIX) 40 MG tablet Take 40 mg by mouth as needed.  . gabapentin (NEURONTIN) 100 MG capsule Take 1 capsule (100 mg total) by mouth at bedtime.  . hydrALAZINE (APRESOLINE) 25 MG tablet Take 1 tablet (25 mg total) by mouth 2 (two) times daily.  [name] ibuprofen (ADVIL,MOTRIN) 600 MG tablet Take 1 tablet (600 mg total) by mouth every 8 (eight) hours as needed.  . metoprolol (LOPRESSOR) 50 MG tablet Take 50 mg by mouth as directed. 1/2 TABLET TWICE A DAY  . NON FORMULARY 2 (two) times daily. tumeric  . omeprazole (PRILOSEC) 20 MG capsule Take 20 mg by mouth daily.  [name] oxybutynin (DITROPAN) 5 MG tablet Take by mouth daily.  . tamsulosin (FLOMAX) 0.4 MG CAPS capsule Take 0.4 mg by mouth daily.  Past Medical History  Diagnosis Date  . COLONIC POLYPS, HX OF   . DIABETES MELLITUS     diet controlled  . DIVERTICULOSIS, COLON   . GOUT   . NEPHROLITHIASIS, HX OF     remote open stone-retrieval on L  . HYPERTENSION     off ACEI 2010 because of hyperkalemia in setting of ARI  . OSA on CPAP   . Cervical stenosis of spinal canal     fusion 2006  . Familial tremor 03/19/2014    Past Surgical History  Procedure Laterality Date  . Strangulated testicle  1960  . Left great toe Left x2- 1985 & 2004  . Anterior fusion cervical spine  2002  . Hernia repair  1962  . Kidney stones  1986  . Posterior fusion cervical spine  2006    History   Social History  . Marital Status: Divorced    Spouse Name: N/A    Number of Children: N/A  . Years of Education: N/A   Occupational History  . Not on  file.   Social History Main Topics  . Smoking status: Former Research scientist (life sciences)  . Smokeless tobacco: Not on file     Comment: widowed 1991 - lives with SO [name] who is Therapist, sports  . Alcohol Use: Yes  . Drug Use: No  . Sexual Activity: Not on file   Other Topics Concern  . Not on file   Social History Narrative  . No narrative on file    No family status information on file.    Review of Systems A complete 10 system ROS was obtained and was negative apart from what is mentioned.   Objective:   VITALS:   Filed Vitals:   03/31/14 0814  BP: 136/66  Pulse: 60  Height: 5' 8.5" (1.74 m)  Weight: 243 lb (110.224 kg)   Gen:  Appears stated age and in NAD. HEENT:  Normocephalic, atraumatic. The mucous membranes are moist. The superficial temporal arteries are without ropiness or tenderness. Cardiovascular: Regular rate and rhythm. Lungs: Clear to auscultation bilaterally. Neck: There are no carotid bruits noted bilaterally.  NEUROLOGICAL:  Orientation:  The patient is alert and oriented x 3.  Recent and remote memory are intact.  Attention span and concentration are normal.  Able to name objects and repeat without trouble.  Fund of knowledge is appropriate Cranial nerves: There is good facial symmetry. The pupils are equal round and reactive to light bilaterally. Fundoscopic exam reveals clear disc margins bilaterally. Extraocular muscles are intact and visual fields are full to confrontational testing. Speech is fluent and clear. Soft palate rises symmetrically and there is no tongue deviation. Hearing is intact to conversational tone. Tone: Tone is good throughout. Sensation: Sensation is intact to light touch and pinprick throughout (facial, trunk, extremities). Vibration is intact at the bilateral big toe. There is no extinction with double simultaneous stimulation. There is no sensory dermatomal level identified. Coordination:  The patient has no dysdiadichokinesia or dysmetria. Motor:  Strength is 5/5 in the bilateral upper and lower extremities.  Shoulder shrug is equal bilaterally.  There is no pronator drift.  There are no fasciculations noted. DTR's: Deep tendon reflexes are 2/4 at the bilateral biceps, triceps, brachioradialis, patella and 1/4 at the bilateral achilles.  Plantar responses are downgoing bilaterally. Gait and Station: The patient is able to ambulate without difficulty. The patient is able to heel toe walk without any difficulty. The patient has mild trouble ambulate able to ambulate in a  tandem fashion. The patient is able to stand in the Romberg position.   MOVEMENT EXAM: Tremor:  There is tremor in the UE, noted most significantly with action.  The L seems worse than the R, although pt c/o the opposite.  The patient has trouble with Archimedes spirals.  There is no tremor at rest.  The patient is not able to pour water from one glass to another without spilling it (spills it when pouring from L hand).  Labs:    Chemistry      Component Value Date/Time   NA 140 03/19/2014 0937   K 4.5 03/19/2014 0937   CL 106 03/19/2014 0937   CO2 27 03/19/2014 0937   BUN 19 03/19/2014 0937   CREATININE 1.3 03/19/2014 0937      Component Value Date/Time   CALCIUM 9.6 03/19/2014 0937   ALKPHOS 47 10/24/2013 1242   AST 31 10/24/2013 1242   ALT 42 10/24/2013 1242   BILITOT 1.9* 10/24/2013 1242     Lab Results  Component Value Date   HGBA1C 5.9 03/19/2014   Lab Results  Component Value Date   WBC 7.4 10/24/2013   HGB 16.4 10/24/2013   HCT 46.9 10/24/2013   MCV 88.9 10/24/2013   PLT 149.0* 10/24/2013   Lab Results  Component Value Date   TSH 3.73 12/04/2013        Assessment/Plan:   1.  Essential Tremor.  -This is evidenced by the symmetrical nature and longstanding hx of gradually getting worse and family hx.  -Discussed characteristics, treatments, both medical and surgical.  Wants to try primidone.  Called pharmacy due to his hx of cytochrome P450 problems  but told that no interaction and not an inducer of this system.  Risks, benefits, side effects and alternative therapies were discussed.  The opportunity to ask questions was given and they were answered to the best of my ability.  The patient expressed understanding and willingness to follow the outlined treatment protocols.  -Not a candidate for topamax due to current nephrolithiasis and hx of same.  Not able to increase beta blocker due to h/o bradycardia.  May be able to push up neurontin but would need to push up to much higher dose.  Discussed DBS as option as well.  Greater than 50% of 60 min visit in counseling. 2. Return in about 3 months (around 07/01/2014).

## 2014-04-02 ENCOUNTER — Ambulatory Visit (INDEPENDENT_AMBULATORY_CARE_PROVIDER_SITE_OTHER): Payer: Medicare Other | Admitting: Family Medicine

## 2014-04-02 ENCOUNTER — Encounter: Payer: Self-pay | Admitting: Family Medicine

## 2014-04-02 VITALS — BP 130/86 | HR 49 | Ht 68.5 in | Wt 247.0 lb

## 2014-04-02 DIAGNOSIS — M4316 Spondylolisthesis, lumbar region: Secondary | ICD-10-CM

## 2014-04-02 NOTE — Patient Instructions (Addendum)
Good to see you as always.  Continue the exercises.  I like the idea of the primidone.  If in 2 weeks not any significant improvement then increase gabapentin to 200mg  at night.  Ice your friend.  Love the shoes!!! See me again in 4-6 weeks.

## 2014-04-02 NOTE — Assessment & Plan Note (Signed)
Patient is improving slowly. I do think the most of his pain is likely secondary from his back and he is responding very well to the low dose gabapentin. Patient being on a new medication and elected not titrate up on the medication at this time. Instead patient will continue on the low dose gabapentin. We talked about the possibility in 2-3 weeks if he continues to have difficulty with sleeping or increasing pain I would increase gabapentin to 200 mg at night. I do not think I would go higher than that. We discussed continuing the icing regimen. We discussed the possibility of starting formal physical therapy which patient declined. Patient will continue with the home exercises and will follow-up in 5-6 weeks for further evaluation and treatment.

## 2014-04-02 NOTE — Progress Notes (Signed)
[facility] [place], [place] [address] Phone: [phone] Subjective:    CC: Left hip pain  [id number] Troy Banks is a 73 y.o. male coming in with complaint of left hip pain. The patient was treated for the potential of iliotibial band syndrome on the left side with mild greater trochanteric bursitis. Patient was having more radicular symptoms going down the lateral aspect of the leg. We did get x-rays the patient's back that did show anterior listhesis at L4-L5 of an 8 mm anterior subluxation. Patient also has moderate osteophytic changes of the right hip. Patient was given home exercises, icing protocol, as well as over-the-counter supplementations.  Patient has continued on conservative therapy. Patient states overall his back seems to be doing relatively well. Patient at last visit did have a change of medication were we started him on meloxicam daily as well as started him on gabapentin at night. Patient was to increase his activity especially with cycling. Patient states he is feeling significantly better. Patient states that he is not having any pain but only pressure. Patient states that this is not stopping him from any of his regular daily activities. Patient states that he is resting a little more comfortably at night but continues to wake up. Patient did see neurology recently and did put him on a new medication. Patient denies any significant radiation down his legs or any numbness or weakness. Overall patient is very happy with the results.     Past medical history, social, surgical and family history all reviewed in electronic medical record.   Review of Systems: No headache, visual changes, nausea, vomiting, diarrhea, constipation, dizziness, abdominal pain, skin rash, fevers, chills, night sweats, weight loss, swollen lymph nodes, body aches, joint swelling, muscle aches, chest pain, shortness of breath, mood changes.    Objective Blood pressure 130/86, pulse 49, height 5' 8.5" (1.74 m), weight 247 lb (112.038 kg), SpO2 96.00%.  General: No apparent distress alert and oriented x3 mood and affect normal, dressed appropriately. Benign tremor of the left upper extremity noted. HEENT: Pupils equal, extraocular movements intact  Respiratory: Patient's speak in full sentences and does not appear short of breath  Cardiovascular: No lower extremity edema, non tender, no erythema  Skin: Warm dry intact with no signs of infection or rash on extremities or on axial skeleton.  Abdomen: Soft nontender  Neuro: Cranial nerves II through XII are intact, neurovascularly intact in all extremities with 2+ DTRs and 2+ pulses.  Lymph: No lymphadenopathy of posterior or anterior cervical chain or axillae bilaterally.  Gait normal with good balance and coordination.  MSK:  Non tender with full range of motion and good stability and symmetric strength and tone of shoulders, elbows, wrist,  knee and ankles bilaterally.  Back Exam:  Inspection: Unremarkable  Motion: Flexion 35 deg, Extension 35 deg, Side Bending to 35 deg bilaterally,  Rotation to 35 deg bilaterally  SLR laying: Negative  XSLR laying: Negative  Palpable tenderness: None. FABER: negative. Sensory change: Gross sensation intact to all lumbar and sacral dermatomes.  Reflexes: 2+ at both patellar tendons, 2+ at achilles tendons, Babinski's downgoing.  Strength at foot  Plantar-flexion: 5/5 Dorsi-flexion: 5/5 Eversion: 5/5 Inversion: 5/5  Leg strength  Quad: 5/5 Hamstring: 5/5 Hip flexor: 5/5 Hip abductors: 5/5  Gait unremarkable. Continues to improve and his strength Hip: Left ROM IR: 25 Deg, ER: 35 Deg, Flexion: 100 Deg, Extension: 80 Deg, Abduction: 45 Deg, Adduction: 45 Deg Strength  IR: 4/5, ER: 5/5, Flexion: 4/5, Extension: 5/5, Abduction: 4/5, Adduction: 5/5 Pelvic alignment unremarkable to inspection and palpation. Standing hip rotation and gait  without trendelenburg sign / unsteadiness. Greater trochanter nontender No tenderness over piriformis. Patient is tender over the iliotibial band Still has significant stiffness of the hip girdle when doing Corky Sox test with mild discomfort on the lateral aspect of the hip. No SI joint tenderness and normal minimal SI movement. Negative straight leg test    Impression and Recommendations:     This case required medical decision making of moderate complexity.

## 2014-04-04 ENCOUNTER — Other Ambulatory Visit: Payer: Self-pay

## 2014-04-04 MED ORDER — METOPROLOL TARTRATE 50 MG PO TABS: 50.0000 mg | ORAL_TABLET | ORAL | Status: DC

## 2014-04-04 MED ORDER — ALLOPURINOL 100 MG PO TABS: 100.0000 mg | ORAL_TABLET | Freq: Two times a day (BID) | ORAL | Status: DC

## 2014-04-04 MED ORDER — OMEPRAZOLE 20 MG PO CPDR: 20.0000 mg | DELAYED_RELEASE_CAPSULE | Freq: Every day | ORAL | Status: DC

## 2014-04-04 MED ORDER — AMLODIPINE BESYLATE 10 MG PO TABS: 10.0000 mg | ORAL_TABLET | Freq: Every day | ORAL | Status: DC

## 2014-05-14 ENCOUNTER — Encounter: Payer: Self-pay | Admitting: Family Medicine

## 2014-05-14 ENCOUNTER — Ambulatory Visit (INDEPENDENT_AMBULATORY_CARE_PROVIDER_SITE_OTHER): Payer: Medicare Other | Admitting: Family Medicine

## 2014-05-14 VITALS — BP 132/80 | HR 52 | Ht 68.5 in | Wt 248.0 lb

## 2014-05-14 DIAGNOSIS — M4316 Spondylolisthesis, lumbar region: Secondary | ICD-10-CM

## 2014-05-14 MED ORDER — GABAPENTIN 100 MG PO CAPS: 100.0000 mg | ORAL_CAPSULE | Freq: Every day | ORAL | Status: DC

## 2014-05-14 NOTE — Assessment & Plan Note (Signed)
Patient is doing remarkably well at this time. We discussed continuing the home exercises and we discussed core strengthening exercises. With patient's balance doing better we can start to increase his activity. Discussed with him still extension back exercises can be the most painful. Patient is an avid motorcyclist and warned that I would like him stopping every couple hours to do some stretching. We discussed icing. We will continue the gabapentin and patient can use meloxicam as needed. Lungs patient does well he can follow-up on an as-needed basis.

## 2014-05-14 NOTE — Patient Instructions (Addendum)
It is great to see you Troy Banks is your friend The reason you are doing better is because of everything you are doing.  Keep it up!!! Biking will be one of the best exercises for you We can continue the medicines where we have them.  As long as you do well, see me when you need me.  Happy Holidays!

## 2014-05-14 NOTE — Progress Notes (Signed)
[facility] [address] [place], [place] [address] Phone: [phone] Subjective:    CC: Left hip pain  CBS:[id number] Troy Banks is a 73 y.o. male coming in with complaint of left hip pain. The patient was treated for the potential of iliotibial band syndrome on the left side with mild greater trochanteric bursitis. Patient was having more radicular symptoms going down the lateral aspect of the leg. We did get x-rays the patient's back that did show anterior listhesis at L4-L5 of an 8 mm anterior subluxation. Patient also has moderate osteophytic changes of the right hip. Patient was given home exercises, icing protocol, as well as over-the-counter supplementations.  Patient has continued on conservative therapy. Patient was also started on gabapentin. Patient also takes meloxicam when needed.  Patient states overall his back seems to be doing relatively well. Patient has not needed the meloxicam on a regular basis. Still taking the gabapentin at night. Patient has been working with neurology and has an central tremor is much better as well as balance. Patient feels like a new man he states. Patient is riding his bike fairly regularly. No new symptoms. Patient states he is being more active than usual.     Past medical history, social, surgical and family history all reviewed in electronic medical record.   Review of Systems: No headache, visual changes, nausea, vomiting, diarrhea, constipation, dizziness, abdominal pain, skin rash, fevers, chills, night sweats, weight loss, swollen lymph nodes, body aches, joint swelling, muscle aches, chest pain, shortness of breath, mood changes.   Objective Blood pressure 132/80, pulse 52, height 5' 8.5" (1.74 m), weight 248 lb (112.492 kg), SpO2 95 %.  General: No apparent distress alert and oriented x3 mood and affect normal, dressed appropriately. Benign tremor of the left upper extremity noted. HEENT: Pupils  equal, extraocular movements intact  Respiratory: Patient's speak in full sentences and does not appear short of breath  Cardiovascular: No lower extremity edema, non tender, no erythema  Skin: Warm dry intact with no signs of infection or rash on extremities or on axial skeleton.  Abdomen: Soft nontender  Neuro: Cranial nerves II through XII are intact, neurovascularly intact in all extremities with 2+ DTRs and 2+ pulses.  Lymph: No lymphadenopathy of posterior or anterior cervical chain or axillae bilaterally.  Gait normal with good balance and coordination.  MSK:  Non tender with full range of motion and good stability and symmetric strength and tone of shoulders, elbows, wrist,  knee and ankles bilaterally.  Back Exam:  Inspection: Unremarkable  Motion: Flexion 35 deg, Extension 35 deg, Side Bending to 35 deg bilaterally,  Rotation to 35 deg bilaterally  SLR laying: Negative  XSLR laying: Negative  Palpable tenderness: None. FABER: negative. Sensory change: Gross sensation intact to all lumbar and sacral dermatomes.  Reflexes: 2+ at both patellar tendons, 2+ at achilles tendons, Babinski's downgoing.  Strength at foot  Plantar-flexion: 5/5 Dorsi-flexion: 5/5 Eversion: 5/5 Inversion: 5/5   Leg strength  Quad: 5/5 Hamstring: 5/5 Hip flexor: 5/5 Hip abductors: 5/5  Gait unremarkable. Continues to improve and his strength Hip: Left ROM IR: 25 Deg, ER: 35 Deg, Flexion: 100 Deg, Extension: 80 Deg, Abduction: 45 Deg, Adduction: 45 Deg Strength IR: 4/5, ER: 5/5, Flexion: 4/5, Extension: 5/5, Abduction: 4/5, Adduction: 5/5 Pelvic alignment unremarkable to inspection and palpation. Standing hip rotation and gait without trendelenburg sign / unsteadiness. Greater trochanter nontender No tenderness over piriformis. Patient is nontender over the iliotibial band this  improvement Increased range of motion of the hip girdle but still some tightness No SI joint tenderness and normal minimal SI  movement. Negative straight leg test    Impression and Recommendations:     This case required medical decision making of moderate complexity.

## 2014-06-10 ENCOUNTER — Ambulatory Visit: Payer: Medicare Other | Admitting: Internal Medicine

## 2014-06-12 ENCOUNTER — Other Ambulatory Visit: Payer: Self-pay | Admitting: Neurology

## 2014-06-12 ENCOUNTER — Other Ambulatory Visit: Payer: Self-pay | Admitting: Family Medicine

## 2014-06-12 ENCOUNTER — Other Ambulatory Visit: Payer: Self-pay | Admitting: Internal Medicine

## 2014-06-13 NOTE — Telephone Encounter (Signed)
Primidone refill requested. Per last office note- patient to remain on medication. Refill approved and sent to patient's pharmacy.   

## 2014-06-30 ENCOUNTER — Ambulatory Visit (INDEPENDENT_AMBULATORY_CARE_PROVIDER_SITE_OTHER): Payer: Medicare Other | Admitting: Neurology

## 2014-06-30 ENCOUNTER — Ambulatory Visit: Payer: Medicare Other | Admitting: Neurology

## 2014-06-30 ENCOUNTER — Telehealth: Payer: Self-pay | Admitting: Neurology

## 2014-06-30 ENCOUNTER — Encounter: Payer: Self-pay | Admitting: Neurology

## 2014-06-30 VITALS — BP 152/82 | HR 52 | Ht 68.5 in | Wt 247.0 lb

## 2014-06-30 DIAGNOSIS — G250 Essential tremor: Secondary | ICD-10-CM

## 2014-06-30 MED ORDER — PRIMIDONE 50 MG PO TABS: 50.0000 mg | ORAL_TABLET | Freq: Two times a day (BID) | ORAL | Status: DC

## 2014-06-30 NOTE — Telephone Encounter (Signed)
Pt canceled appt with the answering service on 06-27-14 and will call back to resch

## 2014-06-30 NOTE — Progress Notes (Signed)
Subjective:   Troy Banks was seen in consultation in the movement disorder clinic at the request of [name], MD.  The evaluation is for tremor.  The records that were made available to me were reviewed.  Pt is accompanied by his significant other who supplements the hx.    The patient is a 74 y.o. left handed male with a history of tremor.  Pt reports tremor for at least 30 years.  Pt has noted increasing tremor over the years. Tremor is mostly in the right hand, but it is in the left as well and more rarely in the head as well.  Stress brings out the tremor.   He has never been to a neurologist for tremor.   Is currently on small amount of metoprolol - 25 mg - but that cannot be increased due to hx of bradycardia.  There is a family hx of tremor in his mother, maternal GM, maternal sister and maternal cousins.    Affected by caffeine:  No. (only drinks 2 cups coffee per week; drinks green tea) Affected by alcohol:  Used to decrease tremor but doesn't seem to any longer Affected by stress:  Yes.   Affected by fatigue:  Yes.   Spills soup if on spoon:  No. but has trouble with corn/peas Spills glass of liquid if full:  Yes.   Affects ADL's (tying shoes, brushing teeth, etc):  Yes.   (slow to tie shoes)  06/30/14 update:  Last visit, we started the patient on primidone for his tremor.  He cannot take Topamax because of history of nephrolithiasis or beta blockers because of bradycardia.  The patient reports that he is overall doing well.  For the first time in years, he was able to stand up and put on his jeans as he thinks that the medication helped his balance as well as tremor.  He has good and bad days in terms of tremor.  He has no SE in terms of tremor.  He states though that his metoprolol has been cut back since last visit because of bradycardia.    Current/Previously tried tremor medications: neurontin - was up to 300 mg bid for rotator cuff issues but never seemed to help  tremor; back on it at low dose for hip pain.    Outside reports reviewed: historical medical records and referral letter/letters.  Allergies  Allergen Reactions  . Benzodiazepines     Patient states he gets stroke symptoms with benzo's.   . Diazepam   . Mobic [Meloxicam]     Patient states he gets stroke symptoms with benzo's.   . Sertraline Hcl     Pt states all meds that are broken down with C-450- if so have sxs of stroke  . Tramadol Other (See Comments)    Unable to take 450    Outpatient Encounter Prescriptions as of 06/30/2014  Medication Sig  . allopurinol (ZYLOPRIM) 100 MG tablet TAKE ONE TABLET BY MOUTH TWICE DAILY  . amLODipine (NORVASC) 10 MG tablet TAKE ONE TABLET BY MOUTH EVERY DAY  . Cholecalciferol (VITAMIN D3) 2000 UNITS TABS Take 2,000 Units by mouth daily.  . furosemide (LASIX) 40 MG tablet Take 40 mg by mouth as needed.  . hydrALAZINE (APRESOLINE) 25 MG tablet Take 1 tablet (25 mg total) by mouth 2 (two) times daily.  [name] ibuprofen (ADVIL,MOTRIN) 600 MG tablet Take 1 tablet (600 mg total) by mouth every 8 (eight) hours as needed.  . metoprolol (LOPRESSOR) 50 MG  tablet TAKE 1/2 TABLET BY MOUTH TWICE DAILY AS DIRECTED  . NON FORMULARY 2 (two) times daily. tumeric  . omeprazole (PRILOSEC) 20 MG capsule TAKE ONE CAPSULE BY MOUTH EVERY DAY  . oxybutynin (DITROPAN) 5 MG tablet Take by mouth daily.  . primidone (MYSOLINE) 50 MG tablet Take 1 tablet (50 mg total) by mouth 2 (two) times daily.  . [DISCONTINUED] primidone (MYSOLINE) 50 MG tablet TAKE ONE TABLET BY MOUTH AT BEDTIME  . [DISCONTINUED] Diclofenac Sodium 2 % SOLN Apply twice daily.  . [DISCONTINUED] dicyclomine (BENTYL) 10 MG capsule Take 10 mg by mouth 4 (four) times daily as needed for spasms.  . [DISCONTINUED] gabapentin (NEURONTIN) 100 MG capsule Take 1 capsule (100 mg total) by mouth at bedtime.  . [DISCONTINUED] gabapentin (NEURONTIN) 100 MG capsule TAKE ONE CAPSULE BY MOUTH EVERY DAY AT BEDTIME  .  [DISCONTINUED] tamsulosin (FLOMAX) 0.4 MG CAPS capsule Take 0.4 mg by mouth daily.     Past Medical History  Diagnosis Date  . COLONIC POLYPS, HX OF   . DIABETES MELLITUS     diet controlled  . DIVERTICULOSIS, COLON   . GOUT   . NEPHROLITHIASIS, HX OF     remote open stone-retrieval on L  . HYPERTENSION     off ACEI 2010 because of hyperkalemia in setting of ARI  . OSA on CPAP   . Cervical stenosis of spinal canal     fusion 2006  . Familial tremor 03/19/2014    Past Surgical History  Procedure Laterality Date  . Strangulated testicle  1960  . Left great toe Left x2- 1985 & 2004  . Anterior fusion cervical spine  2002  . Hernia repair  1962  . Kidney stones  1986  . Posterior fusion cervical spine  2006    History   Social History  . Marital Status: Divorced    Spouse Name: N/A    Number of Children: N/A  . Years of Education: N/A   Occupational History  . Not on file.   Social History Main Topics  . Smoking status: Former Smoker    Quit date: 06/06/1990  . Smokeless tobacco: Not on file     Comment: widowed 1991 - lives with SO [name] who is Therapist, sports  . Alcohol Use: Yes     Comment: glass of wine once a week  . Drug Use: No  . Sexual Activity: Not on file   Other Topics Concern  . Not on file   Social History Narrative    Family Status  Relation Status Death Age  . Mother Deceased     Heart Disease, tremor  . Father Deceased     Heart Disease  . Sister Deceased     tremor  . Brother      unknown  . Brother Alive     healthy  . Son Alive     healthy    Review of Systems A complete 10 system ROS was obtained and was negative apart from what is mentioned.   Objective:   VITALS:   Filed Vitals:   06/30/14 1059  BP: 152/82  Pulse: 52  Height: 5' 8.5" (1.74 m)  Weight: 247 lb (112.038 kg)   Wt Readings from Last 3 Encounters:  06/30/14 247 lb (112.038 kg)  05/14/14 248 lb (112.492 kg)  04/02/14 247 lb (112.038 kg)     Gen:  Appears  stated age and in NAD. HEENT:  Normocephalic, atraumatic. The mucous membranes are moist. The superficial  temporal arteries are without ropiness or tenderness. Cardiovascular: Regular, bradycardic. Lungs: Clear to auscultation bilaterally. Neck: There are no carotid bruits noted bilaterally.  NEUROLOGICAL:  Orientation:  The patient is alert and oriented x 3.  Recent and remote memory are intact.  Attention span and concentration are normal.  Able to name objects and repeat without trouble.  Fund of knowledge is appropriate Cranial nerves: There is good facial symmetry.  Extraocular muscles are intact and visual fields are full to confrontational testing. Speech is fluent and clear. Soft palate rises symmetrically and there is no tongue deviation. Hearing is intact to conversational tone. Tone: Tone is good throughout. Sensation: Sensation is intact to light touch throughout. Coordination:  The patient has no dysdiadichokinesia or dysmetria. Motor: Strength is 5/5 in the bilateral upper and lower extremities.  Shoulder shrug is equal bilaterally.  There is no pronator drift.  There are no fasciculations noted. Gait and Station: The patient is able to ambulate without difficulty.   MOVEMENT EXAM: Tremor:  There is tremor in the UE, noted most significantly with action.  The L seems worse than the R, although pt c/o the opposite.  The patient has trouble with Archimedes spirals.  There is no tremor at rest.    Labs:    Chemistry      Component Value Date/Time   NA 140 03/19/2014 0937   K 4.5 03/19/2014 0937   CL 106 03/19/2014 0937   CO2 27 03/19/2014 0937   BUN 19 03/19/2014 0937   CREATININE 1.3 03/19/2014 0937      Component Value Date/Time   CALCIUM 9.6 03/19/2014 0937   ALKPHOS 47 10/24/2013 1242   AST 31 10/24/2013 1242   ALT 42 10/24/2013 1242   BILITOT 1.9* 10/24/2013 1242     Lab Results  Component Value Date   HGBA1C 5.9 03/19/2014   Lab Results  Component Value  Date   WBC 7.4 10/24/2013   HGB 16.4 10/24/2013   HCT 46.9 10/24/2013   MCV 88.9 10/24/2013   PLT 149.0* 10/24/2013   Lab Results  Component Value Date   TSH 3.73 12/04/2013        Assessment/Plan:   1.  Essential Tremor.  -This is evidenced by the symmetrical nature and longstanding hx of gradually getting worse and family hx.  -Discussed characteristics, treatments, both medical and surgical.  Increase primidone to 50 mg bid.    Called pharmacy due to his hx of cytochrome P450 problems but told that no interaction and not an inducer of this system.  Risks, benefits, side effects and alternative therapies were discussed.  The opportunity to ask questions was given and they were answered to the best of my ability.  The patient expressed understanding and willingness to follow the outlined treatment protocols.  -Not a candidate for topamax due to current nephrolithiasis and hx of same.  Not able to increase beta blocker due to h/o bradycardia.  May be able to push up neurontin but would need to push up to much higher dose.  Discussed DBS as option as well.  Greater than 50% of 30 min visit in counseling. 2. Return in about 4 months (around 10/29/2014).

## 2014-07-07 LAB — HM DIABETES EYE EXAM

## 2014-07-16 ENCOUNTER — Encounter: Payer: Self-pay | Admitting: Internal Medicine

## 2014-07-25 ENCOUNTER — Other Ambulatory Visit: Payer: Self-pay | Admitting: Internal Medicine

## 2014-07-29 ENCOUNTER — Telehealth: Payer: Self-pay | Admitting: Internal Medicine

## 2014-07-29 DIAGNOSIS — M79673 Pain in unspecified foot: Secondary | ICD-10-CM

## 2014-07-29 NOTE — Telephone Encounter (Signed)
Pt's came in to check on the referral for Mr. Troop to go see foot surgery Dr. Doran Durand. Please check, no referral in system. Pt stated they have been waiting for 3 weeks now, not hear anything from our office.

## 2014-07-29 NOTE — Telephone Encounter (Signed)
Referral has been added.

## 2014-07-30 NOTE — Telephone Encounter (Signed)
Noted Done  thanks

## 2014-07-31 ENCOUNTER — Telehealth: Payer: Self-pay

## 2014-07-31 DIAGNOSIS — M2041 Other hammer toe(s) (acquired), right foot: Secondary | ICD-10-CM

## 2014-07-31 DIAGNOSIS — M79673 Pain in unspecified foot: Secondary | ICD-10-CM

## 2014-08-05 NOTE — Telephone Encounter (Signed)
Erroneous

## 2014-09-08 ENCOUNTER — Other Ambulatory Visit (INDEPENDENT_AMBULATORY_CARE_PROVIDER_SITE_OTHER): Payer: Medicare Other

## 2014-09-08 ENCOUNTER — Ambulatory Visit (INDEPENDENT_AMBULATORY_CARE_PROVIDER_SITE_OTHER): Payer: Medicare Other | Admitting: Internal Medicine

## 2014-09-08 ENCOUNTER — Encounter: Payer: Self-pay | Admitting: Internal Medicine

## 2014-09-08 VITALS — BP 162/86 | HR 43 | Temp 98.1°F | Ht 68.5 in | Wt 245.8 lb

## 2014-09-08 DIAGNOSIS — E119 Type 2 diabetes mellitus without complications: Secondary | ICD-10-CM

## 2014-09-08 DIAGNOSIS — F32A Depression, unspecified: Secondary | ICD-10-CM | POA: Insufficient documentation

## 2014-09-08 DIAGNOSIS — Z23 Encounter for immunization: Secondary | ICD-10-CM | POA: Diagnosis not present

## 2014-09-08 DIAGNOSIS — K219 Gastro-esophageal reflux disease without esophagitis: Secondary | ICD-10-CM | POA: Diagnosis not present

## 2014-09-08 DIAGNOSIS — I10 Essential (primary) hypertension: Secondary | ICD-10-CM | POA: Diagnosis not present

## 2014-09-08 DIAGNOSIS — F329 Major depressive disorder, single episode, unspecified: Secondary | ICD-10-CM

## 2014-09-08 LAB — HEMOGLOBIN A1C: HEMOGLOBIN A1C: 6.3 % (ref 4.6–6.5)

## 2014-09-08 LAB — LIPID PANEL
Cholesterol: 161 mg/dL (ref 0–200)
HDL: 35.1 mg/dL — ABNORMAL LOW
NonHDL: 125.9
Total CHOL/HDL Ratio: 5
Triglycerides: 207 mg/dL — ABNORMAL HIGH (ref 0.0–149.0)
VLDL: 41.4 mg/dL — ABNORMAL HIGH (ref 0.0–40.0)

## 2014-09-08 LAB — LDL CHOLESTEROL, DIRECT: Direct LDL: 100 mg/dL

## 2014-09-08 MED ORDER — AMLODIPINE BESYLATE 10 MG PO TABS: 10.0000 mg | ORAL_TABLET | Freq: Every day | ORAL | Status: DC

## 2014-09-08 MED ORDER — METOPROLOL TARTRATE 50 MG PO TABS: 25.0000 mg | ORAL_TABLET | Freq: Two times a day (BID) | ORAL | Status: DC

## 2014-09-08 MED ORDER — HYDRALAZINE HCL 50 MG PO TABS: 50.0000 mg | ORAL_TABLET | Freq: Two times a day (BID) | ORAL | Status: DC

## 2014-09-08 MED ORDER — ALLOPURINOL 100 MG PO TABS: 100.0000 mg | ORAL_TABLET | Freq: Two times a day (BID) | ORAL | Status: DC

## 2014-09-08 MED ORDER — OMEPRAZOLE 20 MG PO CPDR: 20.0000 mg | DELAYED_RELEASE_CAPSULE | Freq: Every day | ORAL | Status: DC

## 2014-09-08 MED ORDER — RANITIDINE HCL 150 MG PO TABS: 150.0000 mg | ORAL_TABLET | Freq: Two times a day (BID) | ORAL | Status: DC

## 2014-09-08 NOTE — Assessment & Plan Note (Signed)
Concern for PPI interaction with C 450 issue Change to H2B for symptoms control - erx done

## 2014-09-08 NOTE — Progress Notes (Signed)
Subjective:    Patient ID: GOVANI RADLOFF, male    DOB: Jun 15, 1940, 74 y.o.   MRN: [id number]  HPI  Patient here for follow-up. Reviewed chronic conditions, interval events and current concerns  Past Medical History  Diagnosis Date  . COLONIC POLYPS, HX OF   . DIABETES MELLITUS     diet controlled  . DIVERTICULOSIS, COLON   . GOUT   . NEPHROLITHIASIS, HX OF     remote open stone-retrieval on L  . HYPERTENSION     off ACEI 2010 because of hyperkalemia in setting of ARI  . OSA on CPAP   . Cervical stenosis of spinal canal     fusion 2006  . Familial tremor 03/19/2014    Review of Systems  Constitutional: Negative for fever and fatigue.  Respiratory: Negative for cough and shortness of breath.   Cardiovascular: Negative for chest pain and leg swelling.  Psychiatric/Behavioral: Positive for dysphoric mood. Negative for suicidal ideas and self-injury. The patient is nervous/anxious.        Objective:    Physical Exam  Constitutional: He appears well-developed and well-nourished. No distress.  Obese. Significant other [name] at side  Cardiovascular: Normal rate, regular rhythm and normal heart sounds.   No murmur heard. Pulmonary/Chest: Effort normal and breath sounds normal. No respiratory distress.    BP 162/86 mmHg  Pulse 43  Temp(Src) 98.1 F (36.7 C) (Oral)  Ht 5' 8.5" (1.74 m)  Wt 245 lb 12 oz (111.471 kg)  BMI 36.82 kg/m2  SpO2 95% Wt Readings from Last 3 Encounters:  09/08/14 245 lb 12 oz (111.471 kg)  06/30/14 247 lb (112.038 kg)  05/14/14 248 lb (112.492 kg)     Lab Results  Component Value Date   WBC 7.4 10/24/2013   HGB 16.4 10/24/2013   HCT 46.9 10/24/2013   PLT 149.0* 10/24/2013   GLUCOSE 112* 03/19/2014   CHOL 183 10/24/2013   TRIG 292.0* 10/24/2013   HDL 33.80* 10/24/2013   LDLDIRECT 130.2 07/08/2009   LDLCALC 91 10/24/2013   ALT 42 10/24/2013   AST 31 10/24/2013   NA 140 03/19/2014   K 4.5 03/19/2014   CL 106 03/19/2014   CREATININE 1.3 03/19/2014   BUN 19 03/19/2014   CO2 27 03/19/2014   TSH 3.73 12/04/2013   HGBA1C 5.9 03/19/2014   MICROALBUR 1.1 03/19/2014    Dg Lumbar Spine Complete  01/01/2014   CLINICAL DATA:  Back pain.  EXAM: LUMBAR SPINE - COMPLETE 4+ VIEW  COMPARISON:  CT 12/22/2008.  FINDINGS: Right nephrolithiasis . Paraspinal soft tissues are unremarkable. Diffuse degenerative changes of the lumbar spine. Spondylolisthesis with 8 mm anterior subluxation L4 on L5. No acute bony abnormality .  IMPRESSION: 1. Right nephrolithiasis. 2. Spondylolisthesis L4-L5 with 8 mm anterior subluxation of L4 on L5. Diffuse degenerative changes lumbar spine.   Electronically Signed   By: Marcello Moores  Register   On: 01/01/2014 10:58       Assessment & Plan:   Problem List Items Addressed This Visit    Depression    Chronic symptoms with overlapping anxiety Previously has seen psychiatry for same -denies need to resume counseling at this time Unable to treat with SSRI therapy or benzodiazepines due to C4 15 reaction and side effects Support provided today. Patient agrees to call if increasing symptoms for new referral Denies SI/HI      Diet-controlled diabetes mellitus - Primary    Reports historically diet controlled Does not monitor home cbgs  Check  A1c now, consider medication as needed for a1c>7 Lab Results  Component Value Date   HGBA1C 5.9 03/19/2014   The patient is asked to make an attempt to improve diet and exercise patterns to aid in medical management of this problem.      Relevant Orders   Hemoglobin A1c   Lipid panel   Essential hypertension    BP Readings from Last 3 Encounters:  09/08/14 162/86  06/30/14 152/82  05/14/14 132/80   home BP log reviewed, running higher in past 4-6 mo Increase hydralazine dose - erx done Not on ACE inhibitor because of 2010 hospitalization for acute renal insufficiency with hyperkalemia ( hospitalization for same reviewed) Chronic bradycardia,  asymptomatic      Relevant Medications   amLODIpine (NORVASC) tablet   metoprolol (LOPRESSOR) tablet   hydrALAZINE (APRESOLINE) tablet   GERD (gastroesophageal reflux disease)    Concern for PPI interaction with C 450 issue Change to H2B for symptoms control - erx done      Relevant Medications   ranitidine (ZANTAC) tablet       [name], MD

## 2014-09-08 NOTE — Progress Notes (Signed)
Pre visit review using our clinic review tool, if applicable. No additional management support is needed unless otherwise documented below in the visit note. 

## 2014-09-08 NOTE — Assessment & Plan Note (Signed)
Chronic symptoms with overlapping anxiety Previously has seen psychiatry for same -denies need to resume counseling at this time Unable to treat with SSRI therapy or benzodiazepines due to C4 15 reaction and side effects Support provided today. Patient agrees to call if increasing symptoms for new referral Denies SI/HI

## 2014-09-08 NOTE — Assessment & Plan Note (Signed)
BP Readings from Last 3 Encounters:  09/08/14 162/86  06/30/14 152/82  05/14/14 132/80   home BP log reviewed, running higher in past 4-6 mo Increase hydralazine dose - erx done Not on ACE inhibitor because of 2010 hospitalization for acute renal insufficiency with hyperkalemia ( hospitalization for same reviewed) Chronic bradycardia, asymptomatic

## 2014-09-08 NOTE — Assessment & Plan Note (Signed)
Reports historically diet controlled Does not monitor home cbgs  Check A1c now, consider medication as needed for a1c>7 Lab Results  Component Value Date   HGBA1C 5.9 03/19/2014   The patient is asked to make an attempt to improve diet and exercise patterns to aid in medical management of this problem.

## 2014-09-08 NOTE — Patient Instructions (Signed)
It was good to see you today.  We have reviewed your prior records including labs and tests today  Prevnar 13, pneumonia vaccine, administered today. Will administer 2nd pneumococcal vaccine with Pneumovax at next visit  Test(s) ordered today. Your results will be released to [place] (or called to you) after review, usually within 72hours after test completion. If any changes need to be made, you will be notified at that same time.  Medications reviewed and updated Increase dose hydralazine to 50 mg twice daily  Also, discontinue omeprazole and use generic Zantac twice daily for reflux control  Your prescription(s) have been submitted to your pharmacy. Please take as directed and contact our office if you believe you are having problem(s) with the medication(s).  Monitor blood pressure and call if consistently running greater than 140/94 additional medication adjustment as needed  Please schedule followup in 6 months, call sooner if problems.

## 2014-09-28 ENCOUNTER — Encounter: Payer: Self-pay | Admitting: Internal Medicine

## 2014-09-29 ENCOUNTER — Other Ambulatory Visit: Payer: Self-pay

## 2014-09-29 MED ORDER — ONETOUCH ULTRA 2 W/DEVICE KIT: PACK | Status: DC

## 2014-09-29 MED ORDER — ONETOUCH LANCETS MISC: Status: DC

## 2014-09-29 MED ORDER — GLUCOSE BLOOD VI STRP: ORAL_STRIP | Status: DC

## 2014-09-29 MED ORDER — BLOOD GLUCOSE MONITOR KIT: PACK | Status: DC

## 2014-10-08 ENCOUNTER — Telehealth: Payer: Self-pay | Admitting: Neurology

## 2014-10-08 ENCOUNTER — Encounter: Payer: Self-pay | Admitting: Neurology

## 2014-10-08 ENCOUNTER — Ambulatory Visit (INDEPENDENT_AMBULATORY_CARE_PROVIDER_SITE_OTHER): Payer: Medicare Other | Admitting: Neurology

## 2014-10-08 VITALS — BP 152/78 | HR 56 | Ht 68.5 in | Wt 240.0 lb

## 2014-10-08 DIAGNOSIS — M542 Cervicalgia: Secondary | ICD-10-CM | POA: Diagnosis not present

## 2014-10-08 DIAGNOSIS — Z9889 Other specified postprocedural states: Secondary | ICD-10-CM | POA: Diagnosis not present

## 2014-10-08 DIAGNOSIS — R253 Fasciculation: Secondary | ICD-10-CM

## 2014-10-08 DIAGNOSIS — R251 Tremor, unspecified: Secondary | ICD-10-CM

## 2014-10-08 NOTE — Telephone Encounter (Signed)
Patient made aware to follow up with PCP re: testing.

## 2014-10-08 NOTE — Patient Instructions (Signed)
1. We have scheduled you at [facility] for your MRI on 10/23/14 at 3:00 pm. Please arrive at 2:00 pm for lab work and go to 1st floor radiology. If you need to reschedule for any reason please call [phone]. 2. We will call you with an appt for your EMG.

## 2014-10-08 NOTE — Telephone Encounter (Signed)
-----   Message from [place], DO sent at 10/08/2014 11:54 AM EDT ----- Regarding: RE: Question PCP can do the testing.  I am not incredibly familiar with who takes care of it after that. ----- Message -----    From: Annamaria Helling, [place]: 10/08/2014  11:18 AM      To: [name] [name], DO Subject: Question                                       Patient wants to get a second opinion on cytochrome P450 diagnosis. Any ideas where he would start?

## 2014-10-08 NOTE — Progress Notes (Signed)
Subjective:   Troy Banks was seen in consultation in the movement disorder clinic at the request of Troy Grant, MD.  The evaluation is for tremor.  The records that were made available to me were reviewed.  Pt is accompanied by his significant other who supplements the hx.    The patient is a 74 y.o. left handed male with a history of tremor.  Pt reports tremor for at least 30 years.  Pt has noted increasing tremor over the years. Tremor is mostly in the right hand, but it is in the left as well and more rarely in the head as well.  Stress brings out the tremor.   He has never been to a neurologist for tremor.   Is currently on small amount of metoprolol - 25 mg - but that cannot be increased due to hx of bradycardia.  There is a family hx of tremor in his mother, maternal GM, maternal sister and maternal cousins.    Affected by caffeine:  No. (only drinks 2 cups coffee per week; drinks green tea) Affected by alcohol:  Used to decrease tremor but doesn't seem to any longer Affected by stress:  Yes.   Affected by fatigue:  Yes.   Spills soup if on spoon:  No. but has trouble with corn/peas Spills glass of liquid if full:  Yes.   Affects ADL's (tying shoes, brushing teeth, etc):  Yes.   (slow to tie shoes)  06/30/14 update:  Last visit, we started the patient on primidone for his tremor.  He cannot take Topamax because of history of nephrolithiasis or beta blockers because of bradycardia.  The patient reports that he is overall doing well.  For the first time in years, he was able to stand up and put on his jeans as he thinks that the medication helped his balance as well as tremor.  He has good and bad days in terms of tremor.  He has no SE in terms of tremor.  He states though that his metoprolol has been cut back since last visit because of bradycardia.    10/08/14 update:  Pt is on primidone 33m bid.  Pt states that initially it seemed helpful but nowl having tremor; had an episode  last week where couldn't shave because of tremor (not unusual to see tremor while shaving but almost unable to shave) and then whole body started shaking.  Lasted all day.  He had more word finding issues that day (has baseline word finding issues).  States that "I am concerned about something beyond familial tremor."  He is worried about PD.  Having cramping in the fingers and sometimes in the toes. Hands/fingers more than the feet.  He notices that his right arm "jerks", but thinks that that is due to "a botched neck surgery."  No significant issues with swallowing.  No diplopia.  Current/Previously tried tremor medications: neurontin - was up to 300 mg bid for rotator cuff issues but never seemed to help tremor; back on it at low dose for hip pain.    Outside reports reviewed: historical medical records and referral letter/letters.  Allergies  Allergen Reactions  . Benzodiazepines     Patient states he gets stroke symptoms with benzo's.   . Diazepam Other (See Comments)    Stroke sx  . Mobic [Meloxicam]     Patient states he gets stroke symptoms with benzo's.   . Sertraline Hcl     Pt states all meds that are  broken down with C-450- if so have sxs of stroke  . Tramadol Other (See Comments)    Unable to take 450    Outpatient Encounter Prescriptions as of 10/08/2014  Medication Sig  . allopurinol (ZYLOPRIM) 100 MG tablet Take 1 tablet (100 mg total) by mouth 2 (two) times daily.  [name] amLODipine (NORVASC) 10 MG tablet Take 1 tablet (10 mg total) by mouth daily.  . blood glucose meter kit and supplies KIT Use monitor to check blood sugar twice a day. DX E11.09  . Blood Glucose Monitoring Suppl (ONE TOUCH ULTRA 2) W/DEVICE KIT Test up to two times daily  . Cholecalciferol (VITAMIN D3) 2000 UNITS TABS Take 2,000 Units by mouth daily.  . furosemide (LASIX) 40 MG tablet Take 40 mg by mouth as needed.  [name] glucose blood (ONE TOUCH TEST STRIPS) test strip Test up to two times daily  . hydrALAZINE  (APRESOLINE) 50 MG tablet Take 1 tablet (50 mg total) by mouth 2 (two) times daily.  [name] ibuprofen (ADVIL,MOTRIN) 600 MG tablet Take 1 tablet (600 mg total) by mouth every 8 (eight) hours as needed.  . metoprolol (LOPRESSOR) 50 MG tablet Take 0.5 tablets (25 mg total) by mouth 2 (two) times daily.  . NON FORMULARY 2 (two) times daily. tumeric  . ONE TOUCH LANCETS MISC Test up to two times daily  . oxybutynin (DITROPAN) 5 MG tablet Take 5 mg by mouth daily.  . primidone (MYSOLINE) 50 MG tablet Take 1 tablet (50 mg total) by mouth 2 (two) times daily.  . ranitidine (ZANTAC) 150 MG tablet Take 1 tablet (150 mg total) by mouth 2 (two) times daily.   No facility-administered encounter medications on file as of 10/08/2014.    Past Medical History  Diagnosis Date  . COLONIC POLYPS, HX OF   . DIABETES MELLITUS     diet controlled  . DIVERTICULOSIS, COLON   . GOUT   . NEPHROLITHIASIS, HX OF     remote open stone-retrieval on L  . HYPERTENSION     off ACEI 2010 because of hyperkalemia in setting of ARI  . OSA on CPAP   . Cervical stenosis of spinal canal     fusion 2006  . Familial tremor 03/19/2014    Past Surgical History  Procedure Laterality Date  . Strangulated testicle  1960  . Left great toe Left x2- 1985 & 2004  . Anterior fusion cervical spine  2002  . Hernia repair  1962  . Kidney stones  1986  . Posterior fusion cervical spine  2006    History   Social History  . Marital Status: Divorced    Spouse Name: N/A  . Number of Children: N/A  . Years of Education: N/A   Occupational History  . Not on file.   Social History Main Topics  . Smoking status: Former Smoker    Quit date: 06/06/1990  . Smokeless tobacco: Not on file     Comment: widowed 1991 - lives with SO Troy Banks who is Therapist, sports  . Alcohol Use: Yes     Comment: glass of wine once a week  . Drug Use: No  . Sexual Activity: Not on file   Other Topics Concern  . Not on file   Social History Narrative    Family  Status  Relation Status Death Age  . Mother Deceased     Heart Disease, tremor  . Father Deceased     Heart Disease  . Sister Deceased  tremor  . Brother      unknown  . Brother Alive     healthy  . Son Alive     healthy    Review of Systems A complete 10 system ROS was obtained and was negative apart from what is mentioned.   Objective:   VITALS:   Filed Vitals:   10/08/14 1013  BP: 152/78  Pulse: 56  Height: 5' 8.5" (1.74 m)  Weight: 240 lb (108.863 kg)   Wt Readings from Last 3 Encounters:  10/08/14 240 lb (108.863 kg)  09/08/14 245 lb 12 oz (111.471 kg)  06/30/14 247 lb (112.038 kg)   The patient was undressed and placed in shorts for this part of the examination.  Gen:  Appears stated age and in NAD. HEENT:  Normocephalic, atraumatic. The mucous membranes are moist. The superficial temporal arteries are without ropiness or tenderness.  No tongue fasciculations. Cardiovascular: Regular, bradycardic. Lungs: Clear to auscultation bilaterally. Neck: There are no carotid bruits noted bilaterally.  NEUROLOGICAL:  Orientation:  The patient is alert and oriented x 3.  Recent and remote memory are intact.  Attention span and concentration are normal.  Able to name objects and repeat without trouble.  Fund of knowledge is appropriate Cranial nerves: There is good facial symmetry.  Extraocular muscles are intact and visual fields are full to confrontational testing. Speech is fluent and clear. Soft palate rises symmetrically and there is no tongue deviation. Hearing is intact to conversational tone. Tone: Tone is good throughout. Sensation: Sensation is intact to light touch throughout. Coordination:  The patient has no dysdiadichokinesia or dysmetria. Motor: Strength is 5/5 in the bilateral upper and lower extremities.  Shoulder shrug is equal bilaterally.  There is no pronator drift.  There are diffuse fasciculations across the right arm and in the right pectoralis  region and also over the right scapula.  There are more rare fasciculations in the right greater than left gastrocnemius and vastus medialis muscles.  No significant fasciculations in the left arm.  None in the tongue. Gait and Station: The patient is able to ambulate without difficulty.   MOVEMENT EXAM: Tremor:  There is tremor in the UE, noted most significantly with action.  There is head tremor.  Labs:    Chemistry      Component Value Date/Time   NA 140 03/19/2014 0937   K 4.5 03/19/2014 0937   CL 106 03/19/2014 0937   CO2 27 03/19/2014 0937   BUN 19 03/19/2014 0937   CREATININE 1.3 03/19/2014 0937      Component Value Date/Time   CALCIUM 9.6 03/19/2014 0937   ALKPHOS 47 10/24/2013 1242   AST 31 10/24/2013 1242   ALT 42 10/24/2013 1242   BILITOT 1.9* 10/24/2013 1242     Lab Results  Component Value Date   HGBA1C 6.3 09/08/2014   Lab Results  Component Value Date   WBC 7.4 10/24/2013   HGB 16.4 10/24/2013   HCT 46.9 10/24/2013   MCV 88.9 10/24/2013   PLT 149.0* 10/24/2013   Lab Results  Component Value Date   TSH 3.73 12/04/2013        Assessment/Plan:   1.  Essential Tremor.  -This is evidenced by the symmetrical nature and longstanding hx of gradually getting worse and family hx.  -Continue 50 mg bid, but may need to be increased in the future.    Called pharmacy due to his hx of cytochrome P450 problems but told that no interaction and not  an inducer of this system.  Risks, benefits, side effects and alternative therapies were discussed.  The opportunity to ask questions was given and they were answered to the best of my ability.  The patient expressed understanding and willingness to follow the outlined treatment protocols.  -I again reassured the patient that there is absolutely no evidence of Parkinson's disease, which she is very worried about.  -Not a candidate for topamax due to current nephrolithiasis and hx of same.  Not able to increase beta blocker  due to h/o bradycardia.  May be able to push up neurontin but would need to push up to much higher dose.  2.  Fasciculations  -The patient does have diffuse fasciculations across the right arm and right chest and sporadic fasciculations in both legs.  I think he needs a larger workup.  He will have an EMG.  He will have an MRI of the brain (diagnosis: Tremor).  He will have an MRI of the cervical spine with and without gadolinium given his history of neck pain and previous surgery and fasciculations. 3.  The patient had multiple questions today and I answered them to the best of my ability.  Much greater than 50% of this 40 minute visit was spent in counseling with the patient and coordinating care.  I explained to him in detail that I could not answer the cause of fasciculations until he got the testing done, but nonetheless he asked extensive questions regarding differential diagnoses.

## 2014-10-10 ENCOUNTER — Telehealth: Payer: Self-pay | Admitting: Neurology

## 2014-10-10 NOTE — Telephone Encounter (Signed)
Pt wife called and states that she needs to talk with you please call [phone]  Pt wife name is sandra

## 2014-10-10 NOTE — Telephone Encounter (Signed)
Spoke with patient's wife at [phone] and cleared up information about testing. They would like to have date of EMG and made her aware front desk would call when Dr Posey Pronto lets them know how long to schedule.

## 2014-10-16 ENCOUNTER — Telehealth: Payer: Self-pay | Admitting: Internal Medicine

## 2014-10-16 DIAGNOSIS — F32A Depression, unspecified: Secondary | ICD-10-CM

## 2014-10-16 DIAGNOSIS — F329 Major depressive disorder, single episode, unspecified: Secondary | ICD-10-CM

## 2014-10-16 NOTE — Telephone Encounter (Signed)
Would like some labs put in the system that she states Dr. Asa Lente has requested for patient.

## 2014-10-17 NOTE — Telephone Encounter (Signed)
[facility] will to the C450  [phone] phone number.   An acct will need to be established. They will be able to walk (you/us) through what needs to be done.    (Neuro may be the one that they want)??????

## 2014-10-21 NOTE — Telephone Encounter (Signed)
Need to order test Cytochrome P450 (may also be called CYP450) Blood or cheek swab sample ok with me Dx: depression (F32.9)  Please let me know if questions or problems - thanks!

## 2014-10-22 NOTE — Telephone Encounter (Signed)
I think that I have the test entered in. Spoke to [employer] at the [employer]. I have faxed her the requisition that prints. Pt will lab drawn tomorrow while at [facility] for MRI.   Pt contacted and informed of above.

## 2014-10-23 ENCOUNTER — Other Ambulatory Visit (HOSPITAL_COMMUNITY): Payer: Medicare Other

## 2014-10-23 ENCOUNTER — Ambulatory Visit (HOSPITAL_COMMUNITY): Admission: RE | Admit: 2014-10-23 | Payer: Medicare Other | Source: Ambulatory Visit

## 2014-10-24 ENCOUNTER — Telehealth: Payer: Self-pay | Admitting: Neurology

## 2014-10-24 ENCOUNTER — Ambulatory Visit (HOSPITAL_COMMUNITY)
Admission: RE | Admit: 2014-10-24 | Discharge: 2014-10-24 | Disposition: A | Discharge status: Home / Self Care | Payer: Medicare Other | Source: Ambulatory Visit | Attending: Neurology | Admitting: Neurology

## 2014-10-24 DIAGNOSIS — M542 Cervicalgia: Secondary | ICD-10-CM

## 2014-10-24 DIAGNOSIS — R251 Tremor, unspecified: Secondary | ICD-10-CM | POA: Diagnosis not present

## 2014-10-24 DIAGNOSIS — M4802 Spinal stenosis, cervical region: Secondary | ICD-10-CM | POA: Insufficient documentation

## 2014-10-24 DIAGNOSIS — G250 Essential tremor: Secondary | ICD-10-CM | POA: Insufficient documentation

## 2014-10-24 DIAGNOSIS — Z981 Arthrodesis status: Secondary | ICD-10-CM | POA: Diagnosis not present

## 2014-10-24 DIAGNOSIS — M129 Arthropathy, unspecified: Secondary | ICD-10-CM | POA: Insufficient documentation

## 2014-10-24 DIAGNOSIS — M5021 Other cervical disc displacement,  high cervical region: Secondary | ICD-10-CM | POA: Diagnosis not present

## 2014-10-24 DIAGNOSIS — Z9889 Other specified postprocedural states: Secondary | ICD-10-CM

## 2014-10-24 LAB — POCT I-STAT CREATININE: Creatinine, Ser: 1.4 mg/dL — ABNORMAL HIGH (ref 0.61–1.24)

## 2014-10-24 MED ORDER — GADOBENATE DIMEGLUMINE 529 MG/ML IV SOLN
20.0000 mL | Freq: Once | INTRAVENOUS | Status: AC | PRN
  Administered 2014-10-24: 20 mL via INTRAVENOUS

## 2014-10-24 NOTE — Telephone Encounter (Signed)
Spoke with radiology, they wanted to know why patient needed contrast for his MR. Patient has a history of back surgery, so contrast needed. They will call with any other questions.

## 2014-10-27 ENCOUNTER — Telehealth: Payer: Self-pay | Admitting: Neurology

## 2014-10-27 NOTE — Telephone Encounter (Signed)
-----   Message from Oxly, DO sent at 10/27/2014 11:20 AM EDT ----- MRI brain normal.  MRI cervical spine with stenosis esp at C6-7 level but also at C3-4 level.  Falicity Sheets, please let pt know that we will see what EMG shows before doing anything else.  Also, MRI suggested may be mild thyroid enlargement.  Is out of my field, but should f/u with PCP

## 2014-10-27 NOTE — Telephone Encounter (Signed)
Patient and wife made aware of results.  

## 2014-10-28 ENCOUNTER — Ambulatory Visit (INDEPENDENT_AMBULATORY_CARE_PROVIDER_SITE_OTHER): Payer: Medicare Other | Admitting: Neurology

## 2014-10-28 DIAGNOSIS — M5412 Radiculopathy, cervical region: Secondary | ICD-10-CM

## 2014-10-28 DIAGNOSIS — R253 Fasciculation: Secondary | ICD-10-CM

## 2014-10-28 DIAGNOSIS — M5417 Radiculopathy, lumbosacral region: Secondary | ICD-10-CM

## 2014-10-28 NOTE — Procedures (Signed)
[facility]  [address]  [place], [place] [address] Tel: [phone] Fax:  [phone] Test Date:  10/28/2014  Patient: Troy Banks DOB: [birth year] Physician: Narda Amber, DO  Sex: Male Height: 5\' 8"  Ref Phys: Alonza Bogus  ID#: [id number]   Technician: Jerilynn Mages. Dean   Patient Complaints: This is a 74 year-old gentleman presenting for evaluation of right sided fasiculations and tremor of the arm and leg.  He has a prior history of neck surgery.   NCV & EMG Findings: Extensive electrodiagnostic testing of the right upper extremity, right lower extremity, midthoracic paraspinal muscles  (T7 and T11 levels), and additional studies of the bulbar muscles shows:  1. Right median, ulnar, and radial sensory responses are within normal limits. 2. Right median and ulnar motor responses are within normal limits. 3. Right sural and superficial peroneal sensory responses are within normal limits. 4. Right peroneal and tibial motor responses are within normal limits. 5. In the arms, active on chronic motor axon loss changes are seen affecting C6 myotomes. Fasciculation potentials are seen involving 2 of 8 tested muscles. 6. In the legs, sparse active and chronic motor axon loss changes are seen affecting the medial gastrocnemius and biceps femoris short head muscles. Diffuse fasciculation potentials are seen involving 6 out of the 8 tested muscles. 7. There is no evidence of active or chronic motor axon loss changes affecting the thoracic paraspinal muscles at the T7 and T11 levels. 8. There is no evidence of active or chronic motor axon loss changes affecting the hyoglossus or mentalis muscles.   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 These findings may result from lesions due to compression, among other etiologies,  including degeneration of anterior horn cells.  Findings are insufficient for the diagnosis of an evolving widespread disorder of the anterior horn cells, as is seen in motor neuron disease (MND) or amyotrophic lateral sclerosis (ALS). Based on the [employer] of Neurology [place] criteria, the changes are too restricted in distribution for a definitive electrodiagnosis of MND.  Eectrodiagnostic testing can be repeated in 3-6 months to look for an evolving process, if clinically indicated. ___________________________ Narda Amber, DO    Nerve Conduction Studies Anti Sensory Summary Table   Site NR Peak (ms) Norm Peak (ms) P-T Amp (V) Norm P-T Amp  Right Median Anti Sensory (2nd Digit)  Wrist    3.4 <3.8 10.6 >10  Right Radial Anti Sensory (Base 1st Digit)  Wrist    1.5 <2.8 32.6 >10  Right Sup Peroneal Anti Sensory (Ant Lat Mall)  12 cm    3.3 <4.6 11.1 >3  Right Sural Anti Sensory (Lat Mall)  Calf    3.7 <4.6 10.3 >3  Right Ulnar Anti Sensory (5th Digit)  Wrist    2.7 <3.2 8.1 >5   Motor Summary Table   Site NR Onset (ms) Norm Onset (ms) O-P Amp (mV) Norm O-P Amp Site1 Site2 Delta-0 (ms) Dist (cm) Vel (m/s) Norm Vel (m/s)  Right Median Motor (Abd Poll Brev)  Wrist    3.4 <4.0 10.6 >5 Elbow Wrist 4.1 24.0 59 >50  Elbow    7.5  9.8         Right Peroneal Motor (Ext Dig Brev)  Ankle    3.1 <6.0 8.6 >2.5 B Fib Ankle 6.0 32.0 53 >40  B Fib    9.1  7.5  Poplt B Fib 1.8 10.0 56 >40  Poplt    10.9  7.4         Right Peroneal TA Motor (Tib Ant)  Fib Head    2.7 <4.5 4.0 >3 Poplit Fib Head 1.7 10.0 59 >40  Poplit    4.4  3.8         Right Tibial Motor (Abd Hall Brev)  Ankle    2.4 <6.0 8.8 >4 Knee Ankle 8.7 40.0 46 >40  Knee    11.1  7.5         Right Ulnar Motor (Abd Dig Minimi)  Wrist    3.0 <3.1 10.3 >7 B Elbow Wrist 4.0 22.0 55 >50  B Elbow    7.0  10.0  A Elbow B Elbow 1.6 10.0 63 >50  A Elbow    8.6  9.6          H Reflex Studies   NR H-Lat (ms) Lat Norm (ms)  L-R H-Lat (ms) M-Lat (ms) HLat-MLat (ms)  Right Tibial (Gastroc)     34.42 <35  5.31 29.11   EMG   Side Muscle Ins Act Fibs Psw Fasc Number Recrt Dur Dur. Amp Amp. Poly Poly. Comment  Right AntTibialis 1+ Nml Nml 2+ Nml Nml Nml Nml Nml Nml Nml Nml N/A  Right Gastroc Nml Nml 1+ 2+ 1- Mod-R Few 1+ Few 1+ Nml Nml N/A  Right Flex Dig Long Nml Nml Nml 1+ Nml Nml Nml Nml Nml Nml Nml Nml N/A  Right RectFemoris Nml Nml Nml 1+ Nml Nml Nml Nml Nml Nml Nml Nml N/A  Right GluteusMed Nml Nml Nml Nml Nml Nml Nml Nml Nml Nml Nml Nml N/A  Right BicepsFemS Nml Nml Nml 1+ 1- Mod-R Few 1+ Few 1+ Nml Nml N/A  Right Low Lumbar PSP Nml Nml Nml Nml NE - - - - - - - N/A  Right T7 Parasp Nml Nml Nml Nml NE - - - - - - - N/A  Right T11 Parasp Nml Nml Nml Nml Nml - - - - - - - N/A  Right 1stDorInt Nml Nml Nml Nml Nml Nml Nml Nml Nml Nml Nml Nml N/A  Right Ext Indicis Nml Nml Nml Nml Nml Nml Nml Nml Nml Nml Nml Nml N/A  Right Abd Poll Brev Nml Nml Nml Nml Nml Nml Nml Nml Nml Nml Nml Nml N/A  Right PronatorTeres Nml Nml Nml Nml Nml Nml Nml Nml Nml Nml Nml Nml N/A  Right Biceps Nml 1+ Nml 1+ 1- Rapid Many 1+ Some 1+ Nml Nml N/A  Right Triceps Nml Nml Nml Nml Nml Nml Nml Nml Nml Nml Nml Nml N/A  Right Deltoid Nml Nml Nml 1+ 1- Rapid Many 1+ Many 1+ Nml Nml N/A  Right Infraspinatus Nml Nml Nml Nml 1- Mod-V Nml Nml Nml Nml Nml Nml N/A  Right Biceps (Short Hd) Nml Nml Nml Nml Nml Nml Nml Nml Nml Nml Nml Nml N/A  Right Mentalis Nml Nml Nml Nml Nml Nml Nml Nml Nml Nml Nml Nml N/A  Right Hyoglossus Nml Nml Nml Nml Nml Nml Nml Nml Nml Nml Nml Nml N/A      Waveforms:

## 2014-10-29 ENCOUNTER — Ambulatory Visit: Payer: Medicare Other | Admitting: Neurology

## 2014-10-30 ENCOUNTER — Encounter: Payer: Self-pay | Admitting: Neurology

## 2014-10-30 ENCOUNTER — Ambulatory Visit (INDEPENDENT_AMBULATORY_CARE_PROVIDER_SITE_OTHER): Payer: Medicare Other | Admitting: Neurology

## 2014-10-30 VITALS — BP 140/66 | HR 84 | Ht 67.5 in | Wt 240.0 lb

## 2014-10-30 DIAGNOSIS — R569 Unspecified convulsions: Secondary | ICD-10-CM | POA: Diagnosis not present

## 2014-10-30 DIAGNOSIS — R94131 Abnormal electromyogram [EMG]: Secondary | ICD-10-CM

## 2014-10-30 DIAGNOSIS — R253 Fasciculation: Secondary | ICD-10-CM

## 2014-10-30 DIAGNOSIS — M5416 Radiculopathy, lumbar region: Secondary | ICD-10-CM | POA: Diagnosis not present

## 2014-10-30 NOTE — Patient Instructions (Addendum)
1. We have scheduled you at [facility] for your MRI on 11/11/2014 at 5:00 pm. Please arrive 15 minutes prior and go to 1st floor radiology. If you need to reschedule for any reason please call [phone]. 2.  EMG in 4 months.  90 minutes. 3.  Routine EEG.  If normal, 48 hour EEG.

## 2014-10-30 NOTE — Progress Notes (Signed)
Subjective:   Troy Banks was seen in consultation in the movement disorder clinic at the request of [name], MD.  The evaluation is for tremor.  The records that were made available to me were reviewed.  Pt is accompanied by his significant other who supplements the hx.    The patient is a 74 y.o. left handed male with a history of tremor.  Pt reports tremor for at least 30 years.  Pt has noted increasing tremor over the years. Tremor is mostly in the right hand, but it is in the left as well and more rarely in the head as well.  Stress brings out the tremor.   He has never been to a neurologist for tremor.   Is currently on small amount of metoprolol - 25 mg - but that cannot be increased due to hx of bradycardia.  There is a family hx of tremor in his mother, maternal GM, maternal sister and maternal cousins.    Affected by caffeine:  No. (only drinks 2 cups coffee per week; drinks green tea) Affected by alcohol:  Used to decrease tremor but doesn't seem to any longer Affected by stress:  Yes.   Affected by fatigue:  Yes.   Spills soup if on spoon:  No. but has trouble with corn/peas Spills glass of liquid if full:  Yes.   Affects ADL's (tying shoes, brushing teeth, etc):  Yes.   (slow to tie shoes)  06/30/14 update:  Last visit, we started the patient on primidone for his tremor.  He cannot take Topamax because of history of nephrolithiasis or beta blockers because of bradycardia.  The patient reports that he is overall doing well.  For the first time in years, he was able to stand up and put on his jeans as he thinks that the medication helped his balance as well as tremor.  He has good and bad days in terms of tremor.  He has no SE in terms of tremor.  He states though that his metoprolol has been cut back since last visit because of bradycardia.    10/08/14 update:  Pt is on primidone 31m bid.  Pt states that initially it seemed helpful but nowl having tremor; had an episode  last week where couldn't shave because of tremor (not unusual to see tremor while shaving but almost unable to shave) and then whole body started shaking.  Lasted all day.  He had more word finding issues that day (has baseline word finding issues).  States that "I am concerned about something beyond familial tremor."  He is worried about PD.  Having cramping in the fingers and sometimes in the toes. Hands/fingers more than the feet.  He notices that his right arm "jerks", but thinks that that is due to "a botched neck surgery."  No significant issues with swallowing.  No diplopia.  10/30/14 update:  The patient is seen today, accompanied by his wife who supplements the history.  He had an MRI of the brain on 10/24/2014 that was unremarkable with exception of mild small vessel disease.  He had an MRI of the cervical spine demonstrating stenosis at several levels, but especially at the C6-C7 level and C3-C4 levels.  There was questionable thyroid enlargement and he was asked to follow up with his primary care physician in that regard.  On 10/28/2014 he underwent an EMG at our office.  This suggested chronic on active changes and intraspinal lesion at the right C6 level and  right S1 levels.  The etiology of this is unknown, but it could be anywhere from compressive etiologies to degeneration of the anterior horn cells.  He does not meet criteria for motor neuron disease at this point in time.  In regards to tremor, he has a few what he calls "episodes" in which his arms or legs will tremor more and may last 45 seconds.  BS was normal.  No known stress sets them off.  He may have a stuttering speech when it happens.    Current/Previously tried tremor medications: neurontin - was up to 300 mg bid for rotator cuff issues but never seemed to help tremor; back on it at low dose for hip pain.    Outside reports reviewed: historical medical records and referral letter/letters.  Allergies  Allergen Reactions  .  Benzodiazepines     Patient states he gets stroke symptoms with benzo's.   . Diazepam Other (See Comments)    Stroke sx  . Mobic [Meloxicam]     Patient states he gets stroke symptoms with benzo's.   . Sertraline Hcl     Pt states all meds that are broken down with C-450- if so have sxs of stroke  . Tramadol Other (See Comments)    Unable to take 450    Outpatient Encounter Prescriptions as of 10/30/2014  Medication Sig  . allopurinol (ZYLOPRIM) 100 MG tablet Take 1 tablet (100 mg total) by mouth 2 (two) times daily.  [name] amLODipine (NORVASC) 10 MG tablet Take 1 tablet (10 mg total) by mouth daily.  . blood glucose meter kit and supplies KIT Use monitor to check blood sugar twice a day. DX E11.09  . Blood Glucose Monitoring Suppl (ONE TOUCH ULTRA 2) W/DEVICE KIT Test up to two times daily  . Cholecalciferol (VITAMIN D3) 2000 UNITS TABS Take 2,000 Units by mouth daily.  . furosemide (LASIX) 40 MG tablet Take 40 mg by mouth as needed.  [name] glucose blood (ONE TOUCH TEST STRIPS) test strip Test up to two times daily  . hydrALAZINE (APRESOLINE) 50 MG tablet Take 1 tablet (50 mg total) by mouth 2 (two) times daily.  [name] ibuprofen (ADVIL,MOTRIN) 600 MG tablet Take 1 tablet (600 mg total) by mouth every 8 (eight) hours as needed.  . metoprolol (LOPRESSOR) 50 MG tablet Take 0.5 tablets (25 mg total) by mouth 2 (two) times daily.  . NON FORMULARY 2 (two) times daily. tumeric  . ONE TOUCH LANCETS MISC Test up to two times daily  . oxybutynin (DITROPAN) 5 MG tablet Take 5 mg by mouth daily.  . primidone (MYSOLINE) 50 MG tablet Take 1 tablet (50 mg total) by mouth 2 (two) times daily.  . ranitidine (ZANTAC) 150 MG tablet Take 1 tablet (150 mg total) by mouth 2 (two) times daily.   No facility-administered encounter medications on file as of 10/30/2014.    Past Medical History  Diagnosis Date  . COLONIC POLYPS, HX OF   . DIABETES MELLITUS     diet controlled  . DIVERTICULOSIS, COLON   . GOUT   .  NEPHROLITHIASIS, HX OF     remote open stone-retrieval on L  . HYPERTENSION     off ACEI 2010 because of hyperkalemia in setting of ARI  . OSA on CPAP   . Cervical stenosis of spinal canal     fusion 2006  . Familial tremor 03/19/2014    Past Surgical History  Procedure Laterality Date  . Strangulated testicle  1960  .  Left great toe Left x2- 1985 & 2004  . Anterior fusion cervical spine  2002  . Hernia repair  1962  . Kidney stones  1986  . Posterior fusion cervical spine  2006    History   Social History  . Marital Status: Divorced    Spouse Name: N/A  . Number of Children: N/A  . Years of Education: N/A   Occupational History  . Not on file.   Social History Main Topics  . Smoking status: Former Smoker    Quit date: 06/06/1990  . Smokeless tobacco: Not on file     Comment: widowed 1991 - lives with SO [name] who is Therapist, sports  . Alcohol Use: Yes     Comment: glass of wine once a week  . Drug Use: No  . Sexual Activity: Not on file   Other Topics Concern  . Not on file   Social History Narrative    Family Status  Relation Status Death Age  . Mother Deceased     Heart Disease, tremor  . Father Deceased     Heart Disease  . Sister Deceased     tremor  . Brother      unknown  . Brother Alive     healthy  . Son Alive     healthy    Review of Systems A complete 10 system ROS was obtained and was negative apart from what is mentioned.   Objective:   VITALS:   Filed Vitals:   10/30/14 1058  BP: 140/66  Pulse: 84  Height: 5' 7.5" (1.715 m)  Weight: 240 lb (108.863 kg)   Wt Readings from Last 3 Encounters:  10/30/14 240 lb (108.863 kg)  10/08/14 240 lb (108.863 kg)  09/08/14 245 lb 12 oz (111.471 kg)    Gen:  Appears stated age and in NAD. HEENT:  Normocephalic, atraumatic. The mucous membranes are moist. The superficial temporal arteries are without ropiness or tenderness.  No tongue fasciculations. Cardiovascular: Regular, bradycardic. Lungs:  Clear to auscultation bilaterally. Neck: There are no carotid bruits noted bilaterally.  NEUROLOGICAL:  Orientation:  The patient is alert and oriented x 3.  Recent and remote memory are intact.  Attention span and concentration are normal.  Able to name objects and repeat without trouble.  Fund of knowledge is appropriate Cranial nerves: There is good facial symmetry.  Extraocular muscles are intact and visual fields are full to confrontational testing. Speech is fluent and clear. Soft palate rises symmetrically and there is no tongue deviation. Hearing is intact to conversational tone. Tone: Tone is good throughout. Sensation: Sensation is intact to light touch throughout. Coordination:  The patient has no dysdiadichokinesia or dysmetria. Motor: Strength is 5/5 in the bilateral upper and lower extremities.  Shoulder shrug is equal bilaterally.  There is no pronator drift.  There are diffuse fasciculations across the right arm and in the right pectoralis region and also over the right scapula.  There are more rare fasciculations in the right greater than left gastrocnemius and vastus medialis muscles.  No significant fasciculations in the left arm.  None in the tongue. Gait and Station: The patient is able to ambulate without difficulty.   MOVEMENT EXAM: Tremor:  There is tremor in the UE, noted most significantly with action.  There is head tremor.  Labs:    Chemistry      Component Value Date/Time   NA 140 03/19/2014 0937   K 4.5 03/19/2014 0937   CL 106 03/19/2014  0937   CO2 27 03/19/2014 0937   BUN 19 03/19/2014 0937   CREATININE 1.40* 10/24/2014 1448      Component Value Date/Time   CALCIUM 9.6 03/19/2014 0937   ALKPHOS 47 10/24/2013 1242   AST 31 10/24/2013 1242   ALT 42 10/24/2013 1242   BILITOT 1.9* 10/24/2013 1242     Lab Results  Component Value Date   HGBA1C 6.3 09/08/2014   Lab Results  Component Value Date   WBC 7.4 10/24/2013   HGB 16.4 10/24/2013   HCT 46.9  10/24/2013   MCV 88.9 10/24/2013   PLT 149.0* 10/24/2013   Lab Results  Component Value Date   TSH 3.73 12/04/2013        Assessment/Plan:   1.  Essential Tremor.  -This is evidenced by the symmetrical nature and longstanding hx of gradually getting worse and family hx.  -Continue 50 mg bid, but may need to be increased in the future.    Called pharmacy previously due to his hx of cytochrome P450 problems but told that no interaction and not an inducer of this system.  Actually has lab work pending again to confirm this diagnosis.  Risks, benefits, side effects and alternative therapies were discussed.  The opportunity to ask questions was given and they were answered to the best of my ability.  The patient expressed understanding and willingness to follow the outlined treatment protocols.  -Not a candidate for topamax due to current nephrolithiasis and hx of same.  Not able to increase beta blocker due to h/o bradycardia.  May be able to push up neurontin but would need to push up to much higher dose.  2.  Fasciculations  -The differential diagnosis remains large and he and I discussed this in detail.  EMG suggested chronic on active changes and intraspinal lesion at the right C6 level and right S1 levels.  The etiology of this is unknown, but it could be anywhere from compressive etiologies to degeneration of the anterior horn cells.  He does not meet criteria for motor neuron disease at this point in time.  We will, therefore, repeat his EMG in 4-6 months.  I offered him a second opinion as well, but he and his wife declined that today.  -The patient does have a compressive lesion in the cervical spine, but I'm not sure that that explains all of his symptoms.  He will have an MRI of the lumbar spine.  I reviewed films with him in the room today. 3.  Episodes of tremulousness, accompanied by potential speech change and ataxia according to his wife  -We will do a routine EEG.  If that is  negative, we will do a 48 hour EEG. 4.  Much greater than 50% of this 40 minute visit spent in counseling, as above.

## 2014-11-05 ENCOUNTER — Other Ambulatory Visit: Payer: Self-pay | Admitting: *Deleted

## 2014-11-05 ENCOUNTER — Ambulatory Visit (INDEPENDENT_AMBULATORY_CARE_PROVIDER_SITE_OTHER): Payer: Medicare Other | Admitting: Neurology

## 2014-11-05 DIAGNOSIS — R251 Tremor, unspecified: Secondary | ICD-10-CM

## 2014-11-05 DIAGNOSIS — R258 Other abnormal involuntary movements: Secondary | ICD-10-CM

## 2014-11-05 DIAGNOSIS — IMO0001 Reserved for inherently not codable concepts without codable children: Secondary | ICD-10-CM

## 2014-11-05 DIAGNOSIS — M5416 Radiculopathy, lumbar region: Secondary | ICD-10-CM

## 2014-11-05 DIAGNOSIS — R6889 Other general symptoms and signs: Principal | ICD-10-CM

## 2014-11-06 ENCOUNTER — Telehealth: Payer: Self-pay | Admitting: Neurology

## 2014-11-06 ENCOUNTER — Telehealth: Payer: Self-pay | Admitting: Internal Medicine

## 2014-11-06 NOTE — Telephone Encounter (Signed)
Left message on machine for patient to call back. To make him aware EEG normal. To proceed with Ambulatory EEG.

## 2014-11-06 NOTE — Telephone Encounter (Signed)
Requesting results of chest x ray

## 2014-11-06 NOTE — Telephone Encounter (Signed)
Patient made aware EEG normal.  

## 2014-11-06 NOTE — Procedures (Signed)
ELECTROENCEPHALOGRAM REPORT  Date of Study: 11/05/2014  Patient's Name: Troy Banks MRN: [id number] Date of Birth: 1940/10/09  Referring Provider: Dr. Wells Guiles [name]  Clinical History: This is a 74 year old man with episodes of tremulousness, accompanied by potential speech change and ataxia   Medications: Mysoline, Norvasc, Lopressor  Technical Summary: A multichannel digital EEG recording measured by the international 10-20 system with electrodes applied with paste and impedances below 5000 ohms performed in our laboratory with EKG monitoring in an awake and drowsy patient.  Hyperventilation and photic stimulation were performed.  The digital EEG was referentially recorded, reformatted, and digitally filtered in a variety of bipolar and referential montages for optimal display.    Description: The patient is awake and drowsy during the recording.  During maximal wakefulness, there is a symmetric, medium voltage 9.5-10 Hz posterior dominant rhythm that attenuates with eye opening.  The record is symmetric.  During drowsiness, there is an increase in theta slowing of the background.  Deeper stages of sleep were not seen.  Hyperventilation and photic stimulation did not elicit any abnormalities.  There were no epileptiform discharges or electrographic seizures seen.    EKG lead showed sinus bradycardia at 48 bpm.  Impression: This awake and drowsy EEG is normal.    Clinical Correlation: A normal EEG does not exclude a clinical diagnosis of epilepsy.  If further clinical questions remain, prolonged EEG may be helpful.  Clinical correlation is advised.   Ellouise Newer, M.D.

## 2014-11-07 ENCOUNTER — Telehealth: Payer: Self-pay | Admitting: Internal Medicine

## 2014-11-07 NOTE — Telephone Encounter (Signed)
Lvm for [name] to call me back.  We don't have the results for the C450 enzyme.

## 2014-11-07 NOTE — Telephone Encounter (Signed)
[name] is calling to see if we have received the results of the c450 antigen sent out on 10/24/2014. Advised nothing showing on file. She asks that you give her a call with update

## 2014-11-10 ENCOUNTER — Ambulatory Visit: Payer: Medicare Other | Admitting: Family Medicine

## 2014-11-10 NOTE — Telephone Encounter (Signed)
Previous note already exists. Closing this note.

## 2014-11-10 NOTE — Telephone Encounter (Signed)
[employer]. They have faxed over the results.  Gave to Dr. Ronnald Ramp to release.  Pt wife informed of the results.   Heterozygous Positive for the CYP2D6*4

## 2014-11-11 ENCOUNTER — Ambulatory Visit (HOSPITAL_COMMUNITY)
Admission: RE | Admit: 2014-11-11 | Discharge: 2014-11-11 | Disposition: A | Discharge status: Home / Self Care | Payer: Medicare Other | Source: Ambulatory Visit | Attending: Neurology | Admitting: Neurology

## 2014-11-11 DIAGNOSIS — M2548 Effusion, other site: Secondary | ICD-10-CM | POA: Diagnosis not present

## 2014-11-11 DIAGNOSIS — M4726 Other spondylosis with radiculopathy, lumbar region: Secondary | ICD-10-CM | POA: Insufficient documentation

## 2014-11-11 DIAGNOSIS — M4806 Spinal stenosis, lumbar region: Secondary | ICD-10-CM | POA: Diagnosis not present

## 2014-11-11 DIAGNOSIS — M4316 Spondylolisthesis, lumbar region: Secondary | ICD-10-CM | POA: Diagnosis not present

## 2014-11-11 DIAGNOSIS — M5416 Radiculopathy, lumbar region: Secondary | ICD-10-CM

## 2014-11-11 NOTE — Telephone Encounter (Signed)
Other phone notes regarding same.

## 2014-11-20 ENCOUNTER — Encounter: Payer: Self-pay | Admitting: Internal Medicine

## 2014-11-21 ENCOUNTER — Telehealth: Payer: Self-pay | Admitting: Internal Medicine

## 2014-11-21 NOTE — Telephone Encounter (Signed)
Left vm for patient

## 2014-11-21 NOTE — Telephone Encounter (Signed)
Would like copy of lab work for C-4 50 testing.  Has not been uploaded to [facility].

## 2014-11-21 NOTE — Telephone Encounter (Signed)
Marked for release to my chart.   Can have a copy for them to pick up or can mail if they would like.

## 2014-11-24 ENCOUNTER — Other Ambulatory Visit: Payer: Medicare Other

## 2014-11-24 NOTE — Telephone Encounter (Signed)
Copy up front to be picked up. Copy ready for PCP to review and send to scan. Result abstracted into chart.

## 2014-11-24 NOTE — Telephone Encounter (Signed)
Troy Banks called to advise that she would like a copy left up front for her pickup

## 2014-12-02 ENCOUNTER — Other Ambulatory Visit: Payer: Medicare Other

## 2014-12-02 ENCOUNTER — Encounter: Payer: Self-pay | Admitting: Internal Medicine

## 2014-12-04 ENCOUNTER — Other Ambulatory Visit: Payer: Self-pay | Admitting: Internal Medicine

## 2014-12-16 ENCOUNTER — Other Ambulatory Visit: Payer: Self-pay | Admitting: Neurology

## 2014-12-16 ENCOUNTER — Other Ambulatory Visit: Payer: Self-pay | Admitting: Internal Medicine

## 2014-12-17 NOTE — Telephone Encounter (Signed)
Primidone refill requested. Per last office note- patient to remain on medication. Refill approved and sent to patient's pharmacy.   

## 2015-01-12 ENCOUNTER — Other Ambulatory Visit: Payer: Self-pay | Admitting: Internal Medicine

## 2015-02-16 ENCOUNTER — Other Ambulatory Visit: Payer: Self-pay | Admitting: Internal Medicine

## 2015-02-17 ENCOUNTER — Ambulatory Visit: Payer: Medicare Other | Admitting: Neurology

## 2015-03-02 ENCOUNTER — Other Ambulatory Visit: Payer: Self-pay

## 2015-03-02 ENCOUNTER — Other Ambulatory Visit: Payer: Self-pay | Admitting: Internal Medicine

## 2015-03-02 MED ORDER — AMLODIPINE BESYLATE 10 MG PO TABS: 10.0000 mg | ORAL_TABLET | Freq: Every day | ORAL | Status: DC

## 2015-03-10 ENCOUNTER — Encounter: Payer: Self-pay | Admitting: Internal Medicine

## 2015-03-10 ENCOUNTER — Other Ambulatory Visit (INDEPENDENT_AMBULATORY_CARE_PROVIDER_SITE_OTHER): Payer: Medicare Other

## 2015-03-10 ENCOUNTER — Ambulatory Visit (INDEPENDENT_AMBULATORY_CARE_PROVIDER_SITE_OTHER): Payer: Medicare Other | Admitting: Internal Medicine

## 2015-03-10 VITALS — BP 148/84 | HR 61 | Temp 98.1°F | Ht 67.5 in | Wt 237.8 lb

## 2015-03-10 DIAGNOSIS — M1990 Unspecified osteoarthritis, unspecified site: Secondary | ICD-10-CM | POA: Diagnosis not present

## 2015-03-10 DIAGNOSIS — I10 Essential (primary) hypertension: Secondary | ICD-10-CM | POA: Diagnosis not present

## 2015-03-10 DIAGNOSIS — T148 Other injury of unspecified body region: Secondary | ICD-10-CM

## 2015-03-10 DIAGNOSIS — E119 Type 2 diabetes mellitus without complications: Secondary | ICD-10-CM

## 2015-03-10 DIAGNOSIS — T148XXA Other injury of unspecified body region, initial encounter: Secondary | ICD-10-CM

## 2015-03-10 DIAGNOSIS — Z789 Other specified health status: Secondary | ICD-10-CM | POA: Diagnosis not present

## 2015-03-10 DIAGNOSIS — E8889 Other specified metabolic disorders: Secondary | ICD-10-CM

## 2015-03-10 DIAGNOSIS — M1611 Unilateral primary osteoarthritis, right hip: Secondary | ICD-10-CM

## 2015-03-10 LAB — CBC WITH DIFFERENTIAL/PLATELET
BASOS ABS: 0 10*3/uL (ref 0.0–0.1)
Basophils Relative: 0.7 % (ref 0.0–3.0)
EOS PCT: 2.1 % (ref 0.0–5.0)
Eosinophils Absolute: 0.1 10*3/uL (ref 0.0–0.7)
HCT: 46.1 % (ref 39.0–52.0)
HEMOGLOBIN: 15.5 g/dL (ref 13.0–17.0)
Lymphocytes Relative: 31.3 % (ref 12.0–46.0)
Lymphs Abs: 1.8 10*3/uL (ref 0.7–4.0)
MCHC: 33.6 g/dL (ref 30.0–36.0)
MCV: 90.4 fl (ref 78.0–100.0)
MONO ABS: 0.6 10*3/uL (ref 0.1–1.0)
Monocytes Relative: 11.1 % (ref 3.0–12.0)
Neutro Abs: 3.1 10*3/uL (ref 1.4–7.7)
Neutrophils Relative %: 54.8 % (ref 43.0–77.0)
Platelets: 167 10*3/uL (ref 150.0–400.0)
RBC: 5.1 Mil/uL (ref 4.22–5.81)
RDW: 14 % (ref 11.5–15.5)
WBC: 5.6 10*3/uL (ref 4.0–10.5)

## 2015-03-10 LAB — BASIC METABOLIC PANEL
BUN: 14 mg/dL (ref 6–23)
CO2: 25 meq/L (ref 19–32)
Calcium: 9.7 mg/dL (ref 8.4–10.5)
Chloride: 104 mEq/L (ref 96–112)
Creatinine, Ser: 0.89 mg/dL (ref 0.40–1.50)
GFR: 89.26 mL/min (ref >60.00)
GLUCOSE: 116 mg/dL — AB (ref 70–99)
POTASSIUM: 3.9 meq/L (ref 3.5–5.1)
SODIUM: 140 meq/L (ref 135–145)

## 2015-03-10 LAB — MICROALBUMIN / CREATININE URINE RATIO
CREATININE, U: 86.8 mg/dL
MICROALB/CREAT RATIO: 6 mg/g (ref 0.0–30.0)
Microalb, Ur: 5.2 mg/dL — ABNORMAL HIGH (ref 0.0–1.9)

## 2015-03-10 LAB — HEMOGLOBIN A1C: Hgb A1c MFr Bld: 5.8 % (ref 4.6–6.5)

## 2015-03-10 MED ORDER — NABUMETONE 500 MG PO TABS: 500.0000 mg | ORAL_TABLET | Freq: Two times a day (BID) | ORAL | Status: DC | PRN

## 2015-03-10 NOTE — Assessment & Plan Note (Signed)
BP Readings from Last 3 Encounters:  03/10/15 148/84  10/30/14 140/66  10/08/14 152/78   home BP log reviewed, running higher in past 4-6 mo Increased hydralazine dose 09/2014 Not on ACE inhibitor because of 2010 hospitalization for acute renal insufficiency with hyperkalemia (hospitalization for same reviewed) Chronic bradycardia, asymptomatic

## 2015-03-10 NOTE — Progress Notes (Signed)
Pre visit review using our clinic review tool, if applicable. No additional management support is needed unless otherwise documented below in the visit note. 

## 2015-03-10 NOTE — Assessment & Plan Note (Signed)
Reports historically diet controlled Does not monitor home cbgs  Check A1c now, consider medication as needed for a1c>7 Lab Results  Component Value Date   HGBA1C 6.3 09/08/2014   The patient is asked to make an attempt to improve diet and exercise patterns to aid in medical management of this problem.

## 2015-03-10 NOTE — Assessment & Plan Note (Signed)
Follow up with sports med [name]) as needed We'll change anti-inflammatory to one which is compatible with his known P450 issue (nambutone)

## 2015-03-10 NOTE — Assessment & Plan Note (Signed)
Confirmation testing of heterozygous positive state May 2016 labs reviewed Multiple drug implications related to same We'll continue to review with family at time of any prescription change or initiation

## 2015-03-10 NOTE — Progress Notes (Signed)
Subjective:    Patient ID: Troy Banks, male    DOB: 10-May-1941, 74 y.o.   MRN: [id number]  HPI  Patient here for follow up - reviewed chronic medical issues and interval events  Past Medical History  Diagnosis Date  . COLONIC POLYPS, HX OF   . DIABETES MELLITUS     diet controlled  . DIVERTICULOSIS, COLON   . GOUT   . NEPHROLITHIASIS, HX OF     remote open stone-retrieval on L  . HYPERTENSION     off ACEI 2010 because of hyperkalemia in setting of ARI  . OSA on CPAP   . Cervical stenosis of spinal canal     fusion 2006  . Familial tremor 03/19/2014  . 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  Eyes: Negative for visual disturbance (follows at [place] q 59mo).  Respiratory: Negative for cough and shortness of breath.   Cardiovascular: Negative for chest pain and leg swelling.  Neurological: Positive for tremors (chronic w/o change).  Hematological: Bruises/bleeds easily.       Objective:    Physical Exam  Constitutional: He appears well-developed and well-nourished. No distress.  Cardiovascular: Normal rate, regular rhythm and normal heart sounds.   No murmur heard. Pulmonary/Chest: Effort normal and breath sounds normal. No respiratory distress.    BP 148/84 mmHg  Pulse 61  Temp(Src) 98.1 F (36.7 C) (Oral)  Ht 5' 7.5" (1.715 m)  Wt 237 lb 12 oz (107.843 kg)  BMI 36.67 kg/m2  SpO2 97% Wt Readings from Last 3 Encounters:  03/10/15 237 lb 12 oz (107.843 kg)  10/30/14 240 lb (108.863 kg)  10/08/14 240 lb (108.863 kg)    Lab Results  Component Value Date   WBC 7.4 10/24/2013   HGB 16.4 10/24/2013   HCT 46.9 10/24/2013   PLT 149.0* 10/24/2013   GLUCOSE 112* 03/19/2014   CHOL 161 09/08/2014   TRIG 207.0* 09/08/2014   HDL 35.10* 09/08/2014   LDLDIRECT 100.0 09/08/2014   LDLCALC 91 10/24/2013   ALT 42  10/24/2013   AST 31 10/24/2013   NA 140 03/19/2014   K 4.5 03/19/2014   CL 106 03/19/2014   CREATININE 1.40* 10/24/2014   BUN 19 03/19/2014   CO2 27 03/19/2014   TSH 3.73 12/04/2013   HGBA1C 6.3 09/08/2014   MICROALBUR 1.1 03/19/2014    Mr Lumbar Spine Wo Contrast  11/11/2014   CLINICAL DATA:  Lumbar radiculopathy. Low back and bilateral lower extremity pain.  EXAM: MRI LUMBAR SPINE WITHOUT CONTRAST  TECHNIQUE: Multiplanar, multisequence MR imaging of the lumbar spine was performed. No intravenous contrast was administered.  COMPARISON:  Lumbar spine radiographs 01/01/2014. CT abdomen and pelvis 12/22/2008.  FINDINGS: Grade 1 anterolisthesis is again seen of L4 on L5, facet mediated. Multilevel disc desiccation is present, and there is mild-to-moderate disc space narrowing at L4-5 and mild narrowing at L5-S1. Minimal degenerative endplate marrow changes are present in the lower lumbar spine. Vertebral body heights are preserved without evidence of compression fracture. Conus medullaris is normal in signal and terminates at L1. Paraspinal soft tissues are unremarkable.  L1-2:  Minimal disc bulging and facet arthrosis without stenosis.  L2-3: Minimal disc bulging and mild facet arthrosis without stenosis.  L3-4: Moderate facet hypertrophy and at most minimal disc bulging result in minimal left greater than right neural foraminal narrowing without  spinal stenosis. Small left facet joint effusion.  L4-5: Listhesis with disc uncovering and severe facet arthrosis result in mild bilateral lateral recess and mild bilateral neural foraminal stenosis without spinal stenosis.  L5-S1: Minimal disc bulging and severe facet arthrosis result in mild left neural foraminal stenosis without spinal stenosis. A small central annular fissure is noted.  IMPRESSION: 1. Grade 1 anterolisthesis of L4 on L5 due to severe facet arthrosis with mild bilateral lateral recess and neural foraminal stenosis. No spinal stenosis. 2.  Severe facet arthrosis at L5-S1 with mild left neural foraminal stenosis.   Electronically Signed   By: [name]   On: 11/11/2014 18:06       Assessment & Plan:   Problem List Items Addressed This Visit    Arthritis of right hip    Follow up with sports med [name]) as needed We'll change anti-inflammatory to one which is compatible with his known P450 issue (nambutone)      Relevant Medications   nabumetone (RELAFEN) 500 MG tablet   Diet-controlled diabetes mellitus ([name]) - Primary    Reports historically diet controlled Does not monitor home cbgs  Check A1c now, consider medication as needed for a1c>7 Lab Results  Component Value Date   HGBA1C 6.3 09/08/2014   The patient is asked to make an attempt to improve diet and exercise patterns to aid in medical management of this problem.      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

## 2015-03-10 NOTE — Patient Instructions (Signed)
It was good to see you today.  We have reviewed your prior records including labs and tests today  Test(s) ordered today. Your results will be released to [place] (or called to you) after review, usually within 72hours after test completion. If any changes need to be made, you will be notified at that same time.  Medications reviewed and updated Change ibuprofen to generic Relafen twice daily as needed for hip and arthritis pain. Will also help arrange follow-up with Dr. [name] as discussed No other medication changes recommended at this time.  we'll make referral to Dr. [name] . Our office will contact you regarding appointment(s) once made.  Please schedule followup in 6 months, call sooner if problems.

## 2015-03-16 ENCOUNTER — Ambulatory Visit: Payer: Medicare Other | Admitting: Neurology

## 2015-03-17 ENCOUNTER — Encounter: Payer: Self-pay | Admitting: Family Medicine

## 2015-03-17 ENCOUNTER — Ambulatory Visit (INDEPENDENT_AMBULATORY_CARE_PROVIDER_SITE_OTHER): Payer: Medicare Other | Admitting: Family Medicine

## 2015-03-17 ENCOUNTER — Encounter: Payer: Self-pay | Admitting: Neurology

## 2015-03-17 ENCOUNTER — Other Ambulatory Visit: Payer: Self-pay | Admitting: Internal Medicine

## 2015-03-17 ENCOUNTER — Ambulatory Visit (INDEPENDENT_AMBULATORY_CARE_PROVIDER_SITE_OTHER): Payer: Medicare Other | Admitting: Neurology

## 2015-03-17 ENCOUNTER — Other Ambulatory Visit (INDEPENDENT_AMBULATORY_CARE_PROVIDER_SITE_OTHER): Payer: Medicare Other

## 2015-03-17 VITALS — BP 150/72 | HR 60 | Ht 68.0 in | Wt 238.0 lb

## 2015-03-17 VITALS — BP 140/72 | HR 61 | Ht 68.0 in | Wt 239.0 lb

## 2015-03-17 DIAGNOSIS — M25551 Pain in right hip: Secondary | ICD-10-CM | POA: Diagnosis not present

## 2015-03-17 DIAGNOSIS — M545 Low back pain: Secondary | ICD-10-CM | POA: Diagnosis not present

## 2015-03-17 DIAGNOSIS — G250 Essential tremor: Secondary | ICD-10-CM | POA: Diagnosis not present

## 2015-03-17 DIAGNOSIS — M7061 Trochanteric bursitis, right hip: Secondary | ICD-10-CM

## 2015-03-17 DIAGNOSIS — R253 Fasciculation: Secondary | ICD-10-CM

## 2015-03-17 MED ORDER — PRIMIDONE 50 MG PO TABS: ORAL_TABLET | ORAL | Status: DC

## 2015-03-17 MED ORDER — NABUMETONE 500 MG PO TABS: 500.0000 mg | ORAL_TABLET | Freq: Two times a day (BID) | ORAL | Status: DC | PRN

## 2015-03-17 MED ORDER — IBUPROFEN-FAMOTIDINE 800-26.6 MG PO TABS: 1.0000 | ORAL_TABLET | Freq: Two times a day (BID) | ORAL | Status: DC

## 2015-03-17 NOTE — Progress Notes (Signed)
Subjective:   Troy Banks was seen in consultation in the movement disorder clinic at the request of [name], MD.  The evaluation is for tremor.  The records that were made available to me were reviewed.  Pt is accompanied by his significant other who supplements the hx.    The patient is a 74 y.o. left handed male with a history of tremor.  Pt reports tremor for at least 30 years.  Pt has noted increasing tremor over the years. Tremor is mostly in the right hand, but it is in the left as well and more rarely in the head as well.  Stress brings out the tremor.   He has never been to a neurologist for tremor.   Is currently on small amount of metoprolol - 25 mg - but that cannot be increased due to hx of bradycardia.  There is a family hx of tremor in his mother, maternal GM, maternal sister and maternal cousins.    Affected by caffeine:  No. (only drinks 2 cups coffee per week; drinks green tea) Affected by alcohol:  Used to decrease tremor but doesn't seem to any longer Affected by stress:  Yes.   Affected by fatigue:  Yes.   Spills soup if on spoon:  No. but has trouble with corn/peas Spills glass of liquid if full:  Yes.   Affects ADL's (tying shoes, brushing teeth, etc):  Yes.   (slow to tie shoes)  06/30/14 update:  Last visit, we started the patient on primidone for his tremor.  He cannot take Topamax because of history of nephrolithiasis or beta blockers because of bradycardia.  The patient reports that he is overall doing well.  For the first time in years, he was able to stand up and put on his jeans as he thinks that the medication helped his balance as well as tremor.  He has good and bad days in terms of tremor.  He has no SE in terms of tremor.  He states though that his metoprolol has been cut back since last visit because of bradycardia.    10/08/14 update:  Pt is on primidone 64m bid.  Pt states that initially it seemed helpful but nowl having tremor; had an episode  last week where couldn't shave because of tremor (not unusual to see tremor while shaving but almost unable to shave) and then whole body started shaking.  Lasted all day.  He had more word finding issues that day (has baseline word finding issues).  States that "I am concerned about something beyond familial tremor."  He is worried about PD.  Having cramping in the fingers and sometimes in the toes. Hands/fingers more than the feet.  He notices that his right arm "jerks", but thinks that that is due to "a botched neck surgery."  No significant issues with swallowing.  No diplopia.  10/30/14 update:  The patient is seen today, accompanied by his wife who supplements the history.  He had an MRI of the brain on 10/24/2014 that was unremarkable with exception of mild small vessel disease.  He had an MRI of the cervical spine demonstrating stenosis at several levels, but especially at the C6-C7 level and C3-C4 levels.  There was questionable thyroid enlargement and he was asked to follow up with his primary care physician in that regard.  On 10/28/2014 he underwent an EMG at our office.  This suggested chronic on active changes and intraspinal lesion at the right C6 level and  right S1 levels.  The etiology of this is unknown, but it could be anywhere from compressive etiologies to degeneration of the anterior horn cells.  He does not meet criteria for motor neuron disease at this point in time.  In regards to tremor, he has a few what he calls "episodes" in which his arms or legs will tremor more and may last 45 seconds.  BS was normal.  No known stress sets them off.  He may have a stuttering speech when it happens.    03/17/15 update:  The patient presents for follow-up today.  He is on primidone, 50 mg bid for essential tremor. Tremor has picked up but mostly with stress.  He thinks that we need to increase the primidone.   He is not a candidate for Topamax because of nephrolithiasis and is not a candidate for  beta blockers because of bradycardia.  We have worked him up extensively because of fasciculations.   No falls but "my balance is way off."   He had an MRI of the lumbar spine after we last met.  This was done on June 7.  There was evidence of severe facet arthrosis at the L4-L5 level and L5-S1 levels.  I told him that these findings do not explain all of his findings, including fasciculations.  He had an EEG on June 2 that was unremarkable.  This was done because of episodes of transient alteration of awareness that his wife had described.  His wife states that they didn't do the prolonged EEG because his insurance wouldn't allow it without a significant copay.  They are planning on r/s soon.  Current/Previously tried tremor medications: neurontin - was up to 300 mg bid for rotator cuff issues but never seemed to help tremor; back on it at low dose for hip pain.    Outside reports reviewed: historical medical records and referral letter/letters.  Allergies  Allergen Reactions  . Benzodiazepines     Patient states he gets stroke symptoms with benzo's.   . Diazepam Other (See Comments)    Stroke sx  . Mobic [Meloxicam]     Patient states he gets stroke symptoms with benzo's.   . Sertraline Hcl     Pt states all meds that are broken down with C-450- if so have sxs of stroke  . Tramadol Other (See Comments)    Unable to take 450    Outpatient Encounter Prescriptions as of 03/17/2015  Medication Sig  . allopurinol (ZYLOPRIM) 100 MG tablet Take 1 tablet (100 mg total) by mouth 2 (two) times daily.  [name] amLODipine (NORVASC) 10 MG tablet Take 1 tablet (10 mg total) by mouth daily.  . blood glucose meter kit and supplies KIT Use monitor to check blood sugar twice a day. DX E11.09  . Blood Glucose Monitoring Suppl (ONE TOUCH ULTRA 2) W/DEVICE KIT Test up to two times daily  . Cholecalciferol (VITAMIN D3) 2000 UNITS TABS Take 2,000 Units by mouth daily.  . furosemide (LASIX) 40 MG tablet Take 40 mg by  mouth as needed.  [name] glucose blood (ONE TOUCH TEST STRIPS) test strip Test up to two times daily  . hydrALAZINE (APRESOLINE) 50 MG tablet Take 1 tablet (50 mg total) by mouth 2 (two) times daily.  . metoprolol (LOPRESSOR) 50 MG tablet Take 0.5 tablets (25 mg total) by mouth 2 (two) times daily.  . NON FORMULARY 2 (two) times daily. tumeric  . ONE TOUCH LANCETS MISC Test up to two times daily  .  primidone (MYSOLINE) 50 MG tablet TAKE ONE TABLET BY MOUTH TWICE DAILY  . ranitidine (ZANTAC) 150 MG tablet Take 1 tablet (150 mg total) by mouth 2 (two) times daily.  . nabumetone (RELAFEN) 500 MG tablet Take 1 tablet (500 mg total) by mouth 2 (two) times daily as needed. (Patient not taking: Reported on 03/17/2015)   No facility-administered encounter medications on file as of 03/17/2015.    Past Medical History  Diagnosis Date  . COLONIC POLYPS, HX OF   . DIABETES MELLITUS     diet controlled  . DIVERTICULOSIS, COLON   . GOUT   . NEPHROLITHIASIS, HX OF     remote open stone-retrieval on L  . HYPERTENSION     off ACEI 2010 because of hyperkalemia in setting of ARI  . OSA on CPAP   . Cervical stenosis of spinal canal     fusion 2006  . Familial tremor 03/19/2014  . 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 Strangulated testicle  1960  . Left great toe Left x2- 1985 & 2004  . Anterior fusion cervical spine  2002  . Hernia repair  1962  . Kidney stones  1986  . Posterior fusion cervical spine  2006    Social History   Social History  . Marital Status: Divorced    Spouse Name: N/A  . Number of Children: N/A  . Years of Education: N/A   Occupational History  . Not on file.   Social History Main Topics  . Smoking status: Former Smoker    Quit date: 06/06/1990  . Smokeless tobacco: Not on file     Comment: widowed 1991 - lives with SO [name] who is Therapist, sports  . Alcohol  Use: Yes     Comment: glass of wine once a week  . Drug Use: No  . Sexual Activity: Not on file   Other Topics Concern  . Not on file   Social History Narrative    Family Status  Relation Status Death Age  . Mother Deceased     Heart Disease, tremor  . Father Deceased     Heart Disease  . Sister Deceased     tremor  . Brother      unknown  . Brother Alive     healthy  . Son Alive     healthy    Review of Systems A complete 10 system ROS was obtained and was negative apart from what is mentioned.   Objective:   VITALS:   Filed Vitals:   03/17/15 1011  BP: 150/72  Pulse: 60  Height: 5' 8"  (1.727 m)  Weight: 238 lb (107.956 kg)   Wt Readings from Last 3 Encounters:  03/17/15 238 lb (107.956 kg)  03/10/15 237 lb 12 oz (107.843 kg)  10/30/14 240 lb (108.863 kg)   Pt placed into examining shorts for the visit.  Gen:  Appears stated age and in NAD. HEENT:  Normocephalic, atraumatic. The mucous membranes are moist. The superficial temporal arteries are without ropiness or tenderness.  No tongue fasciculations. Cardiovascular: RRR with 3/6 SEM. Lungs: Clear to auscultation bilaterally. Neck: There are no carotid bruits noted bilaterally.  NEUROLOGICAL:  Orientation:  The patient is alert and oriented x 3.  Recent and remote memory are intact.  Attention span and concentration are normal.  Able to name objects and repeat without trouble.  Fund of knowledge is appropriate Cranial nerves: There is good facial symmetry.  Extraocular muscles are intact and visual fields are full to confrontational testing. Speech is fluent and clear. Soft palate rises symmetrically and there is no tongue deviation. Hearing is intact to conversational tone. Tone: Tone is good throughout. Sensation: Sensation is intact to light touch throughout. Coordination:  The patient has no dysdiadichokinesia or dysmetria. Motor: Strength is 5/5 in the bilateral upper and lower extremities.  Shoulder  shrug is equal bilaterally.  There is no pronator drift.  There are mild fasciculations across the right arm and in the right pectoralis region.  Did not see over right scapula today.  There are more rare fasciculations in the right greater than left gastrocnemius and vastus medialis muscles.  No significant fasciculations in the left arm.  None in the tongue. Gait and Station: The patient is able to ambulate without difficulty.   MOVEMENT EXAM: Tremor:  There is tremor in the UE, noted most significantly with action and more on the L side than the right.  There is no significant head tremor today, which is improved.  Labs:    Chemistry      Component Value Date/Time   NA 140 03/10/2015 0948   K 3.9 03/10/2015 0948   CL 104 03/10/2015 0948   CO2 25 03/10/2015 0948   BUN 14 03/10/2015 0948   CREATININE 0.89 03/10/2015 0948      Component Value Date/Time   CALCIUM 9.7 03/10/2015 0948   ALKPHOS 47 10/24/2013 1242   AST 31 10/24/2013 1242   ALT 42 10/24/2013 1242   BILITOT 1.9* 10/24/2013 1242     Lab Results  Component Value Date   HGBA1C 5.8 03/10/2015   Lab Results  Component Value Date   WBC 5.6 03/10/2015   HGB 15.5 03/10/2015   HCT 46.1 03/10/2015   MCV 90.4 03/10/2015   PLT 167.0 03/10/2015   Lab Results  Component Value Date   TSH 3.73 12/04/2013        Assessment/Plan:   1.  Essential Tremor.  -This is evidenced by the symmetrical nature and longstanding hx of gradually getting worse and family hx.  -Increase primidone to 100 mg in AM, continue 50 mg at night.    Called pharmacy previously due to his hx of cytochrome P450 problems but told that no interaction and not an inducer of this system.    Risks, benefits, side effects and alternative therapies were discussed.  The opportunity to ask questions was given and they were answered to the best of my ability.  The patient expressed understanding and willingness to follow the outlined treatment protocols.  -Not a  candidate for topamax due to current nephrolithiasis and hx of same.  Not able to increase beta blocker due to h/o bradycardia.  May be able to push up neurontin but would need to push up to much higher dose.  2.  Fasciculations  -The differential diagnosis remains large and he and I discussed this in detail.  EMG suggested chronic on active changes and intraspinal lesion at the right C6 level and right S1 levels.  The etiology of this is unknown, but it could be anywhere from compressive etiologies to degeneration of the anterior horn cells.  He does not meet criteria for motor neuron disease at this point in time.  Will repeat EMG in December.   -The patient does have a compressive lesion in the cervical spine, but I'm not sure that that explains  all of his symptoms.   3.  Episodes of tremulousness, accompanied by potential speech change and ataxia according to his wife  -Pt can r/s 48 hour EEG at his convenience.  Routine EEG was negative and no further spells. 4.  Much greater than 50% of this 25 minute visit spent in counseling, as above.

## 2015-03-17 NOTE — Progress Notes (Signed)
[facility] [address], [place] [address] Phone: [phone] Subjective:    CC: Left hip pain follow up  LKG:[id number] Troy Banks is a 74 y.o. male coming in with complaint of left hip pain. The patient was treated for the potential of iliotibial band syndrome on the left side with mild greater trochanteric bursitis. Patient was having more radicular symptoms going down the lateral aspect of the leg. We did get x-rays the patient's back that did show anterior listhesis at L4-L5 of an 8 mm anterior subluxation. Patient also has moderate osteophytic changes of the right hip. Patient was given home exercises, icing protocol, as well as over-the-counter supplementations.  Patient has continued on conservative therapy. Patient was also started on gabapentin. Patient has been seen 10 months ago. Patient states that he was doing very good but is starting have the same right-sided hip pain again. Patient did have some discomfort initially. Patient states that it seems to be worse if he puts pressure on that side. Seems to be more posterior than it was previously. Patient did have an injection greater than a year ago that did seem to help but he would like to avoid the needle. Patient states that it is more a snapping sensation and truly pain. Nothing that is keeping him up at night. Patient though is going to be doing a lot of walking and wants to make sure that he has ways to decrease pain if it does occur.       Past medical history, social, surgical and family history all reviewed in electronic medical record.   Review of Systems: No headache, visual changes, nausea, vomiting, diarrhea, constipation, dizziness, abdominal pain, skin rash, fevers, chills, night sweats, weight loss, swollen lymph nodes, body aches, joint swelling, muscle aches, chest pain, shortness of breath, mood changes.   Objective Blood pressure 140/72, pulse 61, height 5\' 8"  (1.727  m), weight 239 lb (108.41 kg), SpO2 97 %.  General: No apparent distress alert and oriented x3 mood and affect normal, dressed appropriately. Benign tremor of the left upper extremity noted. HEENT: Pupils equal, extraocular movements intact  Respiratory: Patient's speak in full sentences and does not appear short of breath  Cardiovascular: No lower extremity edema, non tender, no erythema  Skin: Warm dry intact with no signs of infection or rash on extremities or on axial skeleton.  Abdomen: Soft nontender  Neuro: Cranial nerves II through XII are intact, neurovascularly intact in all extremities with 2+ DTRs and 2+ pulses.  Lymph: No lymphadenopathy of posterior or anterior cervical chain or axillae bilaterally.  Gait normal with good balance and coordination.  MSK:  Non tender with full range of motion and good stability and symmetric strength and tone of shoulders, elbows, wrist,  knee and ankles bilaterally.  Back Exam:  Inspection: Unremarkable  Motion: Flexion 35 deg, Extension 35 deg, Side Bending to 35 deg bilaterally,  Rotation to 35 deg bilaterally  SLR laying: Negative  XSLR laying: Negative  Palpable tenderness: None. FABER: negative. Sensory change: Gross sensation intact to all lumbar and sacral dermatomes.  Reflexes: 2+ at both patellar tendons, 2+ at achilles tendons, Babinski's downgoing.  Strength at foot  Plantar-flexion: 5/5 Dorsi-flexion: 5/5 Eversion: 5/5 Inversion: 5/5   Leg strength  Quad: 5/5 Hamstring: 5/5 Hip flexor: 5/5 Hip abductors: 4/5  Gait unremarkable. Continues to improve and his strength Hip: Left ROM IR: 15 Deg, ER: 35 Deg, Flexion: 100 Deg, Extension: 80 Deg, Abduction:  45 Deg, Adduction: 45 Deg Strength IR: 4/5, ER: 5/5, Flexion: 4/5, Extension: 5/5, Abduction: 4/5, Adduction: 5/5 Pelvic alignment unremarkable to inspection and palpation. Standing hip rotation and gait without trendelenburg sign / unsteadiness. Greater trochanter to palpation  as well as over the gluteal tendon insertion. No SI joint tenderness and normal minimal SI movement. Negative straight leg test    Procedure note [id number]; 15 minutes spent for Therapeutic exercises as stated in above notes.  This included exercises focusing on stretching, strengthening, with significant focus on eccentric aspects. Hip strengthening exercises which included:  Pelvic tilt/bracing to help with proper recruitment of the lower abs and pelvic floor muscles  Glute strengthening to properly contract glutes without over-engaging low back and hamstrings - prone hip extension and glute bridge exercises Proper stretching techniques to increase effectiveness for the hip flexors, groin, quads, piriformis and low back when appropriate   Proper technique shown and discussed handout in great detail with ATC.  All questions were discussed and answered.      Impression and Recommendations:     This case required medical decision making of moderate complexity.

## 2015-03-17 NOTE — Telephone Encounter (Signed)
rx has a PA. PA started on 03/16/2015. Clinical information requested and sent.

## 2015-03-17 NOTE — Telephone Encounter (Signed)
10.11.16 Pt requested update on RELAFEN prescription from visit on 03/10/15. Please call with update. MS

## 2015-03-17 NOTE — Assessment & Plan Note (Signed)
Patient at this point does have more of a gluteal tendon as well as greater trochanteric bursitis. I do not think that this is more of a lumbar radiculopathy with patient having history of spondylolisthesis. Patient work with Product/process development scientist to learn home exercises in greater detail. Patient will try topical anti-inflammatories will avoid oral anti-inflammatories secondary to patient's other comorbidities. Patient given a trial of some oral anti-inflammatory that would be potentially safe is really necessary. We discussed icing regimen. Patient and will come back and see me again in 3-4 weeks after history of an if worsening pain or no improvement I would consider injection.

## 2015-03-17 NOTE — Patient Instructions (Signed)
1. Increase Primidone to 2 in the morning, 1 at night.  2. Schedule repeat EMG for December.  3. Schedule 48 hour EEG at your convenience.  4. Follow up 4 months.

## 2015-03-17 NOTE — Patient Instructions (Addendum)
Good to see you again.  Ice 20 minutes 2 times daily. Usually after activity and before bed. Exercises 3 times a week.  pennsaid pinkie amount topically 2 times daily as needed to the hip and thumbs Wear gloves at night Good shoes with walking It will be a balancing act See me after [place].  If we need to then we will do injection.

## 2015-03-17 NOTE — Progress Notes (Signed)
Pre visit review using our clinic review tool, if applicable. No additional management support is needed unless otherwise documented below in the visit note. 

## 2015-03-17 NOTE — Telephone Encounter (Signed)
MD ok 03/10/15 resent to pharmacy...[name]

## 2015-03-25 ENCOUNTER — Inpatient Hospital Stay (HOSPITAL_COMMUNITY)
Admission: EM | Admit: 2015-03-25 | Discharge: 2015-04-06 | DRG: 217 | Disposition: A | Discharge status: Home / Self Care | Payer: Medicare Other | Attending: Cardiothoracic Surgery | Admitting: Cardiothoracic Surgery

## 2015-03-25 ENCOUNTER — Encounter (HOSPITAL_COMMUNITY): Payer: Self-pay | Admitting: Emergency Medicine

## 2015-03-25 ENCOUNTER — Emergency Department (HOSPITAL_COMMUNITY): Payer: Medicare Other

## 2015-03-25 DIAGNOSIS — D696 Thrombocytopenia, unspecified: Secondary | ICD-10-CM | POA: Diagnosis not present

## 2015-03-25 DIAGNOSIS — Z9989 Dependence on other enabling machines and devices: Secondary | ICD-10-CM

## 2015-03-25 DIAGNOSIS — Z833 Family history of diabetes mellitus: Secondary | ICD-10-CM

## 2015-03-25 DIAGNOSIS — G4733 Obstructive sleep apnea (adult) (pediatric): Secondary | ICD-10-CM | POA: Diagnosis present

## 2015-03-25 DIAGNOSIS — R071 Chest pain on breathing: Secondary | ICD-10-CM | POA: Diagnosis not present

## 2015-03-25 DIAGNOSIS — F909 Attention-deficit hyperactivity disorder, unspecified type: Secondary | ICD-10-CM | POA: Diagnosis present

## 2015-03-25 DIAGNOSIS — Z9689 Presence of other specified functional implants: Secondary | ICD-10-CM

## 2015-03-25 DIAGNOSIS — E119 Type 2 diabetes mellitus without complications: Secondary | ICD-10-CM

## 2015-03-25 DIAGNOSIS — G250 Essential tremor: Secondary | ICD-10-CM | POA: Diagnosis present

## 2015-03-25 DIAGNOSIS — I10 Essential (primary) hypertension: Secondary | ICD-10-CM | POA: Diagnosis present

## 2015-03-25 DIAGNOSIS — Z87891 Personal history of nicotine dependence: Secondary | ICD-10-CM | POA: Diagnosis not present

## 2015-03-25 DIAGNOSIS — Z888 Allergy status to other drugs, medicaments and biological substances status: Secondary | ICD-10-CM

## 2015-03-25 DIAGNOSIS — I442 Atrioventricular block, complete: Secondary | ICD-10-CM | POA: Diagnosis present

## 2015-03-25 DIAGNOSIS — I4891 Unspecified atrial fibrillation: Secondary | ICD-10-CM | POA: Diagnosis not present

## 2015-03-25 DIAGNOSIS — I249 Acute ischemic heart disease, unspecified: Secondary | ICD-10-CM

## 2015-03-25 DIAGNOSIS — K219 Gastro-esophageal reflux disease without esophagitis: Secondary | ICD-10-CM | POA: Diagnosis present

## 2015-03-25 DIAGNOSIS — I25110 Atherosclerotic heart disease of native coronary artery with unstable angina pectoris: Secondary | ICD-10-CM | POA: Diagnosis present

## 2015-03-25 DIAGNOSIS — D6959 Other secondary thrombocytopenia: Secondary | ICD-10-CM | POA: Diagnosis not present

## 2015-03-25 DIAGNOSIS — J9811 Atelectasis: Secondary | ICD-10-CM | POA: Diagnosis not present

## 2015-03-25 DIAGNOSIS — R079 Chest pain, unspecified: Secondary | ICD-10-CM | POA: Diagnosis not present

## 2015-03-25 DIAGNOSIS — E8770 Fluid overload, unspecified: Secondary | ICD-10-CM | POA: Diagnosis not present

## 2015-03-25 DIAGNOSIS — D62 Acute posthemorrhagic anemia: Secondary | ICD-10-CM | POA: Diagnosis not present

## 2015-03-25 DIAGNOSIS — E875 Hyperkalemia: Secondary | ICD-10-CM | POA: Diagnosis not present

## 2015-03-25 DIAGNOSIS — R072 Precordial pain: Secondary | ICD-10-CM | POA: Diagnosis not present

## 2015-03-25 DIAGNOSIS — I2584 Coronary atherosclerosis due to calcified coronary lesion: Secondary | ICD-10-CM | POA: Diagnosis present

## 2015-03-25 DIAGNOSIS — M109 Gout, unspecified: Secondary | ICD-10-CM | POA: Diagnosis present

## 2015-03-25 DIAGNOSIS — R0989 Other specified symptoms and signs involving the circulatory and respiratory systems: Secondary | ICD-10-CM | POA: Diagnosis present

## 2015-03-25 DIAGNOSIS — I700 Atherosclerosis of aorta: Secondary | ICD-10-CM | POA: Diagnosis present

## 2015-03-25 DIAGNOSIS — F319 Bipolar disorder, unspecified: Secondary | ICD-10-CM | POA: Diagnosis present

## 2015-03-25 DIAGNOSIS — E785 Hyperlipidemia, unspecified: Secondary | ICD-10-CM | POA: Diagnosis present

## 2015-03-25 DIAGNOSIS — E8889 Other specified metabolic disorders: Secondary | ICD-10-CM | POA: Diagnosis present

## 2015-03-25 DIAGNOSIS — I252 Old myocardial infarction: Secondary | ICD-10-CM | POA: Diagnosis present

## 2015-03-25 DIAGNOSIS — Z951 Presence of aortocoronary bypass graft: Secondary | ICD-10-CM | POA: Diagnosis present

## 2015-03-25 DIAGNOSIS — I214 Non-ST elevation (NSTEMI) myocardial infarction: Principal | ICD-10-CM | POA: Diagnosis present

## 2015-03-25 DIAGNOSIS — I350 Nonrheumatic aortic (valve) stenosis: Secondary | ICD-10-CM | POA: Diagnosis present

## 2015-03-25 DIAGNOSIS — R001 Bradycardia, unspecified: Secondary | ICD-10-CM | POA: Diagnosis present

## 2015-03-25 DIAGNOSIS — Z8249 Family history of ischemic heart disease and other diseases of the circulatory system: Secondary | ICD-10-CM

## 2015-03-25 DIAGNOSIS — E871 Hypo-osmolality and hyponatremia: Secondary | ICD-10-CM | POA: Diagnosis not present

## 2015-03-25 DIAGNOSIS — Z6836 Body mass index (BMI) 36.0-36.9, adult: Secondary | ICD-10-CM

## 2015-03-25 DIAGNOSIS — Z791 Long term (current) use of non-steroidal anti-inflammatories (NSAID): Secondary | ICD-10-CM

## 2015-03-25 DIAGNOSIS — J90 Pleural effusion, not elsewhere classified: Secondary | ICD-10-CM

## 2015-03-25 DIAGNOSIS — R946 Abnormal results of thyroid function studies: Secondary | ICD-10-CM | POA: Diagnosis present

## 2015-03-25 DIAGNOSIS — E669 Obesity, unspecified: Secondary | ICD-10-CM | POA: Diagnosis present

## 2015-03-25 DIAGNOSIS — Z886 Allergy status to analgesic agent status: Secondary | ICD-10-CM

## 2015-03-25 HISTORY — DX: Nonrheumatic aortic (valve) stenosis: I35.0

## 2015-03-25 LAB — I-STAT CHEM 8, ED
BUN: 27 mg/dL — AB (ref 6–20)
CREATININE: 1 mg/dL (ref 0.61–1.24)
Calcium, Ion: 1.17 mmol/L (ref 1.13–1.30)
Chloride: 105 mmol/L (ref 101–111)
GLUCOSE: 151 mg/dL — AB (ref 65–99)
HEMATOCRIT: 50 % (ref 39.0–52.0)
Hemoglobin: 17 g/dL (ref 13.0–17.0)
Potassium: 3.9 mmol/L (ref 3.5–5.1)
Sodium: 139 mmol/L (ref 135–145)
TCO2: 22 mmol/L (ref 0–100)

## 2015-03-25 LAB — CBC WITH DIFFERENTIAL/PLATELET
BASOS PCT: 1 %
Basophils Absolute: 0.1 10*3/uL (ref 0.0–0.1)
Eosinophils Absolute: 0.1 10*3/uL (ref 0.0–0.7)
Eosinophils Relative: 2 %
HEMATOCRIT: 45.2 % (ref 39.0–52.0)
HEMOGLOBIN: 15.5 g/dL (ref 13.0–17.0)
LYMPHS ABS: 1.7 10*3/uL (ref 0.7–4.0)
LYMPHS PCT: 22 %
MCH: 31 pg (ref 26.0–34.0)
MCHC: 34.3 g/dL (ref 30.0–36.0)
MCV: 90.4 fL (ref 78.0–100.0)
MONO ABS: 0.6 10*3/uL (ref 0.1–1.0)
MONOS PCT: 8 %
NEUTROS ABS: 5.1 10*3/uL (ref 1.7–7.7)
NEUTROS PCT: 67 %
Platelets: 172 10*3/uL (ref 150–400)
RBC: 5 MIL/uL (ref 4.22–5.81)
RDW: 13.4 % (ref 11.5–15.5)
WBC: 7.7 10*3/uL (ref 4.0–10.5)

## 2015-03-25 LAB — I-STAT TROPONIN, ED: TROPONIN I, POC: 0.07 ng/mL (ref 0.00–0.08)

## 2015-03-25 MED ORDER — NITROGLYCERIN 0.4 MG SL SUBL
0.4000 mg | SUBLINGUAL_TABLET | SUBLINGUAL | Status: DC | PRN
  Administered 2015-03-25: 0.4 mg via SUBLINGUAL
  Filled 2015-03-25: qty 1

## 2015-03-25 NOTE — ED Notes (Signed)
MD at bedside. 

## 2015-03-25 NOTE — ED Notes (Signed)
Patient c/o sudden onset central CP about 2 hours ago with chest tightness/SOB.  Patient was talking on the phone at the time it started. EMS VS: HR 48 SB, 170/86, 98% RA (put on 2L O2 for comfort), CBG 114.  Patient hx: HTN, bradycardia ~50 normal.  Patient given 324 ASA PTA.  Patient also reports similar episode occurring 3 days ago that resolved on its own.  Patient A&Ox4. NAD at this time.

## 2015-03-26 ENCOUNTER — Inpatient Hospital Stay (HOSPITAL_COMMUNITY): Payer: Medicare Other

## 2015-03-26 ENCOUNTER — Telehealth: Payer: Self-pay

## 2015-03-26 ENCOUNTER — Encounter (HOSPITAL_COMMUNITY)
Admission: EM | Disposition: A | Discharge status: Home / Self Care | Payer: Self-pay | Source: Home / Self Care | Attending: Cardiothoracic Surgery

## 2015-03-26 ENCOUNTER — Encounter (HOSPITAL_COMMUNITY): Payer: Self-pay | Admitting: Physician Assistant

## 2015-03-26 DIAGNOSIS — R946 Abnormal results of thyroid function studies: Secondary | ICD-10-CM | POA: Diagnosis present

## 2015-03-26 DIAGNOSIS — E8889 Other specified metabolic disorders: Secondary | ICD-10-CM | POA: Diagnosis present

## 2015-03-26 DIAGNOSIS — Z87891 Personal history of nicotine dependence: Secondary | ICD-10-CM | POA: Diagnosis not present

## 2015-03-26 DIAGNOSIS — I4891 Unspecified atrial fibrillation: Secondary | ICD-10-CM | POA: Diagnosis not present

## 2015-03-26 DIAGNOSIS — I25110 Atherosclerotic heart disease of native coronary artery with unstable angina pectoris: Secondary | ICD-10-CM | POA: Diagnosis not present

## 2015-03-26 DIAGNOSIS — E871 Hypo-osmolality and hyponatremia: Secondary | ICD-10-CM | POA: Diagnosis not present

## 2015-03-26 DIAGNOSIS — I700 Atherosclerosis of aorta: Secondary | ICD-10-CM | POA: Diagnosis present

## 2015-03-26 DIAGNOSIS — Z4682 Encounter for fitting and adjustment of non-vascular catheter: Secondary | ICD-10-CM | POA: Diagnosis not present

## 2015-03-26 DIAGNOSIS — R0989 Other specified symptoms and signs involving the circulatory and respiratory systems: Secondary | ICD-10-CM | POA: Diagnosis present

## 2015-03-26 DIAGNOSIS — Z951 Presence of aortocoronary bypass graft: Secondary | ICD-10-CM | POA: Diagnosis not present

## 2015-03-26 DIAGNOSIS — D696 Thrombocytopenia, unspecified: Secondary | ICD-10-CM | POA: Diagnosis not present

## 2015-03-26 DIAGNOSIS — D6959 Other secondary thrombocytopenia: Secondary | ICD-10-CM | POA: Diagnosis not present

## 2015-03-26 DIAGNOSIS — J9811 Atelectasis: Secondary | ICD-10-CM | POA: Diagnosis not present

## 2015-03-26 DIAGNOSIS — D62 Acute posthemorrhagic anemia: Secondary | ICD-10-CM | POA: Diagnosis not present

## 2015-03-26 DIAGNOSIS — I200 Unstable angina: Secondary | ICD-10-CM | POA: Diagnosis not present

## 2015-03-26 DIAGNOSIS — I350 Nonrheumatic aortic (valve) stenosis: Secondary | ICD-10-CM

## 2015-03-26 DIAGNOSIS — R072 Precordial pain: Secondary | ICD-10-CM | POA: Diagnosis not present

## 2015-03-26 DIAGNOSIS — G4733 Obstructive sleep apnea (adult) (pediatric): Secondary | ICD-10-CM | POA: Diagnosis present

## 2015-03-26 DIAGNOSIS — I2584 Coronary atherosclerosis due to calcified coronary lesion: Secondary | ICD-10-CM | POA: Diagnosis present

## 2015-03-26 DIAGNOSIS — I442 Atrioventricular block, complete: Secondary | ICD-10-CM | POA: Diagnosis present

## 2015-03-26 DIAGNOSIS — I358 Other nonrheumatic aortic valve disorders: Secondary | ICD-10-CM | POA: Diagnosis not present

## 2015-03-26 DIAGNOSIS — F319 Bipolar disorder, unspecified: Secondary | ICD-10-CM | POA: Diagnosis present

## 2015-03-26 DIAGNOSIS — I214 Non-ST elevation (NSTEMI) myocardial infarction: Secondary | ICD-10-CM | POA: Diagnosis not present

## 2015-03-26 DIAGNOSIS — E119 Type 2 diabetes mellitus without complications: Secondary | ICD-10-CM | POA: Diagnosis present

## 2015-03-26 DIAGNOSIS — Z8249 Family history of ischemic heart disease and other diseases of the circulatory system: Secondary | ICD-10-CM | POA: Diagnosis not present

## 2015-03-26 DIAGNOSIS — E8770 Fluid overload, unspecified: Secondary | ICD-10-CM | POA: Diagnosis not present

## 2015-03-26 DIAGNOSIS — K219 Gastro-esophageal reflux disease without esophagitis: Secondary | ICD-10-CM | POA: Diagnosis present

## 2015-03-26 DIAGNOSIS — R001 Bradycardia, unspecified: Secondary | ICD-10-CM | POA: Diagnosis present

## 2015-03-26 DIAGNOSIS — I2510 Atherosclerotic heart disease of native coronary artery without angina pectoris: Secondary | ICD-10-CM | POA: Diagnosis not present

## 2015-03-26 DIAGNOSIS — E669 Obesity, unspecified: Secondary | ICD-10-CM | POA: Diagnosis present

## 2015-03-26 DIAGNOSIS — Z01810 Encounter for preprocedural cardiovascular examination: Secondary | ICD-10-CM | POA: Diagnosis not present

## 2015-03-26 DIAGNOSIS — I10 Essential (primary) hypertension: Secondary | ICD-10-CM | POA: Diagnosis present

## 2015-03-26 DIAGNOSIS — J90 Pleural effusion, not elsewhere classified: Secondary | ICD-10-CM | POA: Diagnosis not present

## 2015-03-26 DIAGNOSIS — Z886 Allergy status to analgesic agent status: Secondary | ICD-10-CM | POA: Diagnosis not present

## 2015-03-26 DIAGNOSIS — M109 Gout, unspecified: Secondary | ICD-10-CM | POA: Diagnosis present

## 2015-03-26 DIAGNOSIS — Z791 Long term (current) use of non-steroidal anti-inflammatories (NSAID): Secondary | ICD-10-CM | POA: Diagnosis not present

## 2015-03-26 DIAGNOSIS — Z833 Family history of diabetes mellitus: Secondary | ICD-10-CM | POA: Diagnosis not present

## 2015-03-26 DIAGNOSIS — G250 Essential tremor: Secondary | ICD-10-CM | POA: Diagnosis present

## 2015-03-26 DIAGNOSIS — Z888 Allergy status to other drugs, medicaments and biological substances status: Secondary | ICD-10-CM | POA: Diagnosis not present

## 2015-03-26 DIAGNOSIS — F909 Attention-deficit hyperactivity disorder, unspecified type: Secondary | ICD-10-CM | POA: Diagnosis present

## 2015-03-26 DIAGNOSIS — E785 Hyperlipidemia, unspecified: Secondary | ICD-10-CM | POA: Diagnosis present

## 2015-03-26 DIAGNOSIS — E875 Hyperkalemia: Secondary | ICD-10-CM | POA: Diagnosis not present

## 2015-03-26 DIAGNOSIS — Z6836 Body mass index (BMI) 36.0-36.9, adult: Secondary | ICD-10-CM | POA: Diagnosis not present

## 2015-03-26 HISTORY — PX: CARDIAC CATHETERIZATION: SHX172

## 2015-03-26 LAB — CBC WITH DIFFERENTIAL/PLATELET
BASOS ABS: 0 10*3/uL (ref 0.0–0.1)
Basophils Relative: 1 %
EOS PCT: 1 %
Eosinophils Absolute: 0.1 10*3/uL (ref 0.0–0.7)
HCT: 44.1 % (ref 39.0–52.0)
Hemoglobin: 15 g/dL (ref 13.0–17.0)
LYMPHS ABS: 2.4 10*3/uL (ref 0.7–4.0)
LYMPHS PCT: 29 %
MCH: 31 pg (ref 26.0–34.0)
MCHC: 34 g/dL (ref 30.0–36.0)
MCV: 91.1 fL (ref 78.0–100.0)
MONO ABS: 0.7 10*3/uL (ref 0.1–1.0)
MONOS PCT: 9 %
NEUTROS ABS: 5 10*3/uL (ref 1.7–7.7)
Neutrophils Relative %: 61 %
PLATELETS: 169 10*3/uL (ref 150–400)
RBC: 4.84 MIL/uL (ref 4.22–5.81)
RDW: 13.4 % (ref 11.5–15.5)
WBC: 8.2 10*3/uL (ref 4.0–10.5)

## 2015-03-26 LAB — PROTIME-INR
INR: 1.13 (ref 0.00–1.49)
Prothrombin Time: 14.7 seconds (ref 11.6–15.2)

## 2015-03-26 LAB — BASIC METABOLIC PANEL
Anion gap: 11 (ref 5–15)
BUN: 19 mg/dL (ref 6–20)
CALCIUM: 9.6 mg/dL (ref 8.9–10.3)
CO2: 26 mmol/L (ref 22–32)
Chloride: 105 mmol/L (ref 101–111)
Creatinine, Ser: 1.09 mg/dL (ref 0.61–1.24)
GFR calc Af Amer: >60 mL/min (ref >60)
GLUCOSE: 115 mg/dL — AB (ref 65–99)
POTASSIUM: 4 mmol/L (ref 3.5–5.1)
Sodium: 142 mmol/L (ref 135–145)

## 2015-03-26 LAB — LIPID PANEL
Cholesterol: 184 mg/dL (ref 0–200)
HDL: 36 mg/dL — AB (ref >40)
LDL CALC: 119 mg/dL — AB (ref 0–99)
Total CHOL/HDL Ratio: 5.1 RATIO
Triglycerides: 144 mg/dL (ref <150)
VLDL: 29 mg/dL (ref 0–40)

## 2015-03-26 LAB — TSH: TSH: 7.951 u[IU]/mL — AB (ref 0.350–4.500)

## 2015-03-26 LAB — TROPONIN I
TROPONIN I: 2.14 ng/mL — AB (ref <0.031)
Troponin I: 1.85 ng/mL (ref <0.031)

## 2015-03-26 LAB — HEPATIC FUNCTION PANEL
ALT: 37 U/L (ref 17–63)
AST: 32 U/L (ref 15–41)
Albumin: 3.9 g/dL (ref 3.5–5.0)
Alkaline Phosphatase: 34 U/L — ABNORMAL LOW (ref 38–126)
BILIRUBIN DIRECT: 0.1 mg/dL (ref 0.1–0.5)
Indirect Bilirubin: 1.2 mg/dL — ABNORMAL HIGH (ref 0.3–0.9)
TOTAL PROTEIN: 6.5 g/dL (ref 6.5–8.1)
Total Bilirubin: 1.3 mg/dL — ABNORMAL HIGH (ref 0.3–1.2)

## 2015-03-26 LAB — T4, FREE: FREE T4: 0.8 ng/dL (ref 0.61–1.12)

## 2015-03-26 LAB — HEPARIN LEVEL (UNFRACTIONATED)
HEPARIN UNFRACTIONATED: 0.39 [IU]/mL (ref 0.30–0.70)
HEPARIN UNFRACTIONATED: 0.22 [IU]/mL — AB (ref 0.30–0.70)

## 2015-03-26 LAB — POCT ACTIVATED CLOTTING TIME: Activated Clotting Time: 116 seconds

## 2015-03-26 LAB — GLUCOSE, CAPILLARY: Glucose-Capillary: 120 mg/dL — ABNORMAL HIGH (ref 65–99)

## 2015-03-26 SURGERY — LEFT HEART CATH AND CORONARY ANGIOGRAPHY
Anesthesia: LOCAL

## 2015-03-26 MED ORDER — SODIUM CHLORIDE 0.9 % IJ SOLN: 3.0000 mL | INTRAMUSCULAR | Status: DC | PRN

## 2015-03-26 MED ORDER — NITROGLYCERIN 1 MG/10 ML FOR IR/CATH LAB
INTRA_ARTERIAL | Status: DC | PRN
  Administered 2015-03-26: 17:00:00

## 2015-03-26 MED ORDER — ONDANSETRON HCL 4 MG/2ML IJ SOLN: 4.0000 mg | Freq: Four times a day (QID) | INTRAMUSCULAR | Status: DC | PRN

## 2015-03-26 MED ORDER — METOPROLOL TARTRATE 25 MG PO TABS: 25.0000 mg | ORAL_TABLET | Freq: Two times a day (BID) | ORAL | Status: DC

## 2015-03-26 MED ORDER — CARVEDILOL 6.25 MG PO TABS
6.2500 mg | ORAL_TABLET | Freq: Two times a day (BID) | ORAL | Status: DC
  Filled 2015-03-26: qty 1

## 2015-03-26 MED ORDER — HEPARIN (PORCINE) IN NACL 100-0.45 UNIT/ML-% IJ SOLN
1300.0000 [IU]/h | INTRAMUSCULAR | Status: DC
  Administered 2015-03-27: 1300 [IU]/h via INTRAVENOUS
  Filled 2015-03-26: qty 250

## 2015-03-26 MED ORDER — NITROGLYCERIN 1 MG/10 ML FOR IR/CATH LAB
INTRA_ARTERIAL | Status: AC
  Filled 2015-03-26: qty 10

## 2015-03-26 MED ORDER — SODIUM CHLORIDE 0.9 % IJ SOLN
3.0000 mL | Freq: Two times a day (BID) | INTRAMUSCULAR | Status: DC
  Administered 2015-03-26: 3 mL via INTRAVENOUS

## 2015-03-26 MED ORDER — AMLODIPINE BESYLATE 10 MG PO TABS
10.0000 mg | ORAL_TABLET | Freq: Every day | ORAL | Status: DC
  Administered 2015-03-26 – 2015-03-29 (×4): 10 mg via ORAL
  Filled 2015-03-26 (×4): qty 1

## 2015-03-26 MED ORDER — SODIUM CHLORIDE 0.9 % IV SOLN: 250.0000 mL | INTRAVENOUS | Status: DC | PRN

## 2015-03-26 MED ORDER — ATORVASTATIN CALCIUM 80 MG PO TABS: 80.0000 mg | ORAL_TABLET | Freq: Every day | ORAL | Status: DC

## 2015-03-26 MED ORDER — SODIUM CHLORIDE 0.9 % IJ SOLN
3.0000 mL | Freq: Two times a day (BID) | INTRAMUSCULAR | Status: DC
  Administered 2015-03-26 – 2015-03-29 (×5): 3 mL via INTRAVENOUS

## 2015-03-26 MED ORDER — SODIUM CHLORIDE 0.9 % WEIGHT BASED INFUSION
1.0000 mL/kg/h | INTRAVENOUS | Status: DC
Start: 2015-03-27 — End: 2015-03-26

## 2015-03-26 MED ORDER — ACETAMINOPHEN 325 MG PO TABS
650.0000 mg | ORAL_TABLET | ORAL | Status: DC | PRN
  Administered 2015-03-27 (×2): 650 mg via ORAL
  Filled 2015-03-26 (×2): qty 2

## 2015-03-26 MED ORDER — ALLOPURINOL 100 MG PO TABS
100.0000 mg | ORAL_TABLET | Freq: Two times a day (BID) | ORAL | Status: DC
  Administered 2015-03-26 – 2015-04-06 (×22): 100 mg via ORAL
  Filled 2015-03-26 (×24): qty 1

## 2015-03-26 MED ORDER — ASPIRIN 81 MG PO CHEW: 81.0000 mg | CHEWABLE_TABLET | ORAL | Status: DC

## 2015-03-26 MED ORDER — HEPARIN (PORCINE) IN NACL 100-0.45 UNIT/ML-% IJ SOLN
1300.0000 [IU]/h | INTRAMUSCULAR | Status: DC
  Administered 2015-03-26: 1300 [IU]/h via INTRAVENOUS
  Filled 2015-03-26 (×2): qty 250

## 2015-03-26 MED ORDER — HEPARIN BOLUS VIA INFUSION
4000.0000 [IU] | Freq: Once | INTRAVENOUS | Status: AC
  Administered 2015-03-26: 4000 [IU] via INTRAVENOUS
  Filled 2015-03-26: qty 4000

## 2015-03-26 MED ORDER — ASPIRIN 81 MG PO CHEW
81.0000 mg | CHEWABLE_TABLET | ORAL | Status: AC
  Administered 2015-03-26: 81 mg via ORAL
  Filled 2015-03-26: qty 1

## 2015-03-26 MED ORDER — SODIUM CHLORIDE 0.9 % WEIGHT BASED INFUSION: 3.0000 mL/kg/h | INTRAVENOUS | Status: AC

## 2015-03-26 MED ORDER — LIDOCAINE HCL (PF) 1 % IJ SOLN
INTRAMUSCULAR | Status: AC
  Filled 2015-03-26: qty 30

## 2015-03-26 MED ORDER — ASPIRIN EC 81 MG PO TBEC
81.0000 mg | DELAYED_RELEASE_TABLET | Freq: Every day | ORAL | Status: DC
  Administered 2015-03-27 – 2015-03-29 (×3): 81 mg via ORAL
  Filled 2015-03-26 (×3): qty 1

## 2015-03-26 MED ORDER — NITROGLYCERIN IN D5W 200-5 MCG/ML-% IV SOLN
0.0000 ug/min | INTRAVENOUS | Status: DC
  Administered 2015-03-26: 10 ug/min via INTRAVENOUS

## 2015-03-26 MED ORDER — SODIUM CHLORIDE 0.9 % WEIGHT BASED INFUSION: 3.0000 mL/kg/h | INTRAVENOUS | Status: DC

## 2015-03-26 MED ORDER — ATORVASTATIN CALCIUM 10 MG PO TABS
10.0000 mg | ORAL_TABLET | Freq: Every day | ORAL | Status: DC
  Administered 2015-03-26: 10 mg via ORAL
  Filled 2015-03-26: qty 1

## 2015-03-26 MED ORDER — NITROGLYCERIN IN D5W 200-5 MCG/ML-% IV SOLN
INTRAVENOUS | Status: AC
  Filled 2015-03-26: qty 250

## 2015-03-26 MED ORDER — HEPARIN (PORCINE) IN NACL 2-0.9 UNIT/ML-% IJ SOLN
INTRAMUSCULAR | Status: AC
  Filled 2015-03-26: qty 1000

## 2015-03-26 MED ORDER — NITROGLYCERIN 0.4 MG SL SUBL: 0.4000 mg | SUBLINGUAL_TABLET | SUBLINGUAL | Status: DC | PRN

## 2015-03-26 SURGICAL SUPPLY — 7 items
CATH INFINITI 5FR MULTPACK ANG (CATHETERS) ×1 IMPLANT
KIT HEART LEFT (KITS) ×2 IMPLANT
PACK CARDIAC CATHETERIZATION (CUSTOM PROCEDURE TRAY) ×2 IMPLANT
SHEATH PINNACLE 5F 10CM (SHEATH) ×1 IMPLANT
SYR MEDRAD MARK V 150ML (SYRINGE) ×2 IMPLANT
TRANSDUCER W/STOPCOCK (MISCELLANEOUS) ×2 IMPLANT
WIRE EMERALD 3MM-J .035X150CM (WIRE) ×1 IMPLANT

## 2015-03-26 NOTE — Progress Notes (Addendum)
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  SUBJECTIVE: No chest pain with mild ambulation, no SOB. Has had low HR all his life.  OBJECTIVE Filed Vitals:   03/26/15 2048 03/26/15 2126 03/26/15 2227 03/27/15 0500  BP: 137/61 144/61 158/64 139/74  Pulse: 47 46 48 46  Temp: 98.6 F (37 C)   98.5 F (36.9 C)  TempSrc: Oral   Oral  Resp: 15 17 18 20   Height:      Weight:    229 lb 9.6 oz (104.146 kg)  SpO2: 93% 97% 96% 99%    Intake/Output Summary (Last 24 hours) at 03/27/15 0841 Last data filed at 03/26/15 1850  Gross per 24 hour  Intake 590.28 ml  Output   1200 ml  Net -609.72 ml   Filed Weights   03/25/15 2157 03/26/15 0107 03/27/15 0500  Weight: 240 lb (108.863 kg) 231 lb 4.8 oz (104.917 kg) 229 lb 9.6 oz (104.146 kg)    PHYSICAL EXAM General: Well developed, well nourished, male in no acute distress. Head: Normocephalic, atraumatic.  Neck: Supple without bruits, JVD not elevated. Lungs:  Resp regular and unlabored, CTA. Heart: RRR, S1, S2, no S3, S4, 2/6 murmur; no rub. Abdomen: Soft, non-tender, non-distended, BS + x 4.  Extremities: No clubbing, cyanosis, edema. Bilateral femoral bruits noted, R groin cath site without ecchymosis or hematoma. Slightly decreased pulse R dp Neuro: Alert and oriented X 3. Moves all extremities spontaneously. Psych: Normal affect.  LABS: CBC:  Recent Labs  03/26/15 0343  03/27/15 0339  WBC 8.2 7.2  NEUTROABS 5.0 3.8  HGB 15.0 15.1  HCT 44.1 45.4  MCV 91.1 91.5  PLT 169 179   INR:  Recent Labs  03/26/15 0343  INR 7.32   Basic Metabolic Panel:  Recent Labs  03/26/15 0343 03/27/15 0339  NA 142 143  K 4.0 4.3  CL 105 103  CO2 26 29  GLUCOSE 115* 81  BUN 19 16  CREATININE 1.09 1.09  CALCIUM 9.6 9.6   Liver Function Tests: Lab Results  Component Value Date   ALT 37 03/26/2015   AST 32 03/26/2015   ALKPHOS 34* 03/26/2015   BILITOT 1.3* 03/26/2015   Cardiac Enzymes:  Recent Labs  03/26/15 0629 03/26/15 1030  TROPONINI 2.14* 1.85*    Recent Labs  03/25/15 2242  TROPIPOC 0.07   Hemoglobin A1C: Lab Results  Component Value Date   HGBA1C 6.0* 03/26/2015   Fasting Lipid Panel:  Recent Labs  03/26/15 0343  CHOL 184  HDL 36*  LDLCALC 119*  TRIG 144  CHOLHDL 5.1   Thyroid Function Tests: Lab Results  Component Value Date   TSH 7.951* 03/26/2015   FREE T4 0.80    TELE:  Mostly sinus brady, high 30s at times      ECHO: 03/26/2015 - Left ventricle: The cavity size was mildly dilated. Wall thickness was increased in a  pattern of moderate LVH. Systolic function was normal. The estimated ejection fraction was in the range of 60% to 65%. Doppler parameters are consistent with elevated ventricular end-diastolic filling pressure. - Aortic valve: There was moderate stenosis. Valve area (VTI): 1.44 cm^2. Valve area (Vmax): 1.31 cm^2. Valve area (Vmean): 1.37 cm^2. Mean gradient (S): 21 mm Hg. Peak gradient (S): 44 mm Hg. - Mitral valve: Calcified annulus. There was mild regurgitation. - Left atrium: The atrium was mildly dilated. - Right atrium: The atrium was mildly dilated. - Atrial septum: No defect or patent foramen ovale was identified.  Cath: 03/26/2015   Ost LAD to Prox LAD lesion, 45% stenosed.  Prox LAD to Mid LAD lesion, 80% stenosed.  1st Diag-1 lesion, 95% stenosed. 1st Diag-2 lesion, 55%  stenosed.  Ost Cx lesion, 75% stenosed. Mid Cx lesion, 90% stenosed.  3rd OM, 60% stenosed.  Prox RCA lesion, 60% stenosed. Mid RCA-1 lesion, 99% stenosed. Mid RCA-2 lesion, 60% stenosed.  The left ventricular systolic function is normal. The patient has severe diffuse disease. For the most part his best option is bypass surgery. He does seem to be reluctant to pursue CABG for fear of intraoperative complications. Thankfully he has preserved LV function with relatively normal LVEDP. Recommendations:  Standard post femoral cath care  Restart IV heparin 8 hours post sheath removal; IV nitroglycerin  Recommend CT surgical consultation which will take place in the morning. The patient is indicated that he very much would like to avoid surgery. Based on the amount of lesions and we are unsure how well he will do with antiplatelet agents, CT surgery is his best option. Of the lesions seen, the only likely not PCI amenable target is the ostial circumflex. Probably would not treat the distal LAD or distal diagonal lesions.  Radiology/Studies: Dg Chest Port 1 View 03/25/2015  CLINICAL DATA:  Sudden onset of central chest pain 2 hours ago EXAM: PORTABLE CHEST - 1 VIEW COMPARISON:  None. FINDINGS: Cardiac shadow is at the upper limits of normal. The lungs are well aerated bilaterally. Healed rib fractures are seen on the right. No acute bony abnormality is noted. IMPRESSION: No acute abnormality seen. Electronically Signed   By: Inez Catalina M.D.   On: 03/25/2015 22:37   Current Medications:  . allopurinol  100 mg Oral BID  . amLODipine  10 mg Oral Daily  . aspirin EC  81 mg Oral Daily  . atorvastatin  10 mg Oral q1800  . sodium chloride  3 mL Intravenous Q12H   . heparin 1,300 Units/hr (03/27/15 0300)  . nitroGLYCERIN 20 mcg/min (03/26/15 1817)    ASSESSMENT AND PLAN: Principal Problem:   NSTEMI, initial episode of care [facility]) - cath and echo reports above, TCTS to see, will make sure  they are aware. - continue ASA, statin (increase dose since he had MI) - no BB due to enzyme issues and baseline bradycardia - can add Imdur if needed - can use Plavix or Effient, NOT Brilinta if PCI is performed.  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    Unstable angina (HCC)   ACS (acute coronary syndrome) ([place]) - see above    Aortic stenosis, moderate - see echo report, careful with nitrates  Restart other home rx as appropriate.  Augusto Garbe 8:41 AM 03/27/2015 Cath results were noted. He will need CABG. He is now amenable to proceeding with CABG. TCTS consult pending. His aortic stenosis is only mild-moderate with mean gradient 21 mmHg. Normal LV systolic function with diastolic dysfunction. He is on only low dose atorvastatin after consultation with pharmacy. He has had no further angina.  Walked the circle today.  Groin okay. Will taper IV NTG.  Continue IV heparin. Addendum: after further discussion with pharmacy will use atorvastin 20 mg. Spoke with [employer].  She will arrange for Dr. Servando Snare to see patient.

## 2015-03-26 NOTE — Progress Notes (Signed)
Echocardiogram 2D Echocardiogram has been performed.  Joelene Millin 03/26/2015, 10:02 AM

## 2015-03-26 NOTE — Telephone Encounter (Signed)
Remigio Eisenmenger [facility] Pharmacist) called with questions regarding medicaitons: Lipitor and Coreg due to pt positive CYP 2D6.   PCP left message stating that lipitor and coreg were okay to do at low doses.   Pt may or may not be taking the lipitor 80 mg. If so, pharmacy will continue with that dosage. If not, pharmacy will restart at low dose.

## 2015-03-26 NOTE — Progress Notes (Signed)
ANTICOAGULATION CONSULT NOTE - Initial Consult  Pharmacy Consult for heparin Indication: chest pain/ACS  Allergies  Allergen Reactions  . Benzodiazepines     Patient states he gets stroke symptoms with benzo's.   . Diazepam Other (See Comments)    Stroke sx  . Mobic [Meloxicam]     Patient states he gets stroke symptoms with benzo's.   . Sertraline Hcl     Pt states all meds that are broken down with C-450- if so have sxs of stroke  . Tramadol Other (See Comments)    Unable to take 450    Patient Measurements: Height: 5\' 8"  (172.7 cm) Weight: 240 lb (108.863 kg) IBW/kg (Calculated) : 68.4 Heparin Dosing Weight: 92 kg  Vital Signs: Temp: 99 F (37.2 C) (10/19 2157) Temp Source: Oral (10/19 2157) BP: 169/66 mmHg (10/20 0015) Pulse Rate: 45 (10/20 0015)  Labs:  Recent Labs  03/25/15 2225 03/25/15 2234  HGB 15.5 17.0  HCT 45.2 50.0  PLT 172  --   CREATININE  --  1.00    Estimated Creatinine Clearance: 79.9 mL/min (by C-G formula based on Cr of 1).   Medical History: Past Medical History  Diagnosis Date  . COLONIC POLYPS, HX OF   . DIABETES MELLITUS     diet controlled  . DIVERTICULOSIS, COLON   . GOUT   . NEPHROLITHIASIS, HX OF     remote open stone-retrieval on L  . HYPERTENSION     off ACEI 2010 because of hyperkalemia in setting of ARI  . OSA on CPAP   . Cervical stenosis of spinal canal     fusion 2006  . Familial tremor 03/19/2014  . 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 male presents with CP. To begin heparin for r/o ACS. CBC ok on admission. No AC PTA.  Goal of Therapy:  Heparin level 0.3-0.7 units/ml Monitor platelets by anticoagulation protocol: Yes   Plan:  Heparin IV bolus 4000 units Heparin gtt at 1300 units/hr Will f/u heparin level in 8 hours Daily heparin level and CBC  Sherlon Handing, PharmD,  BCPS Clinical pharmacist, pager [phone] 03/26/2015,12:37 AM

## 2015-03-26 NOTE — Progress Notes (Signed)
UR Completed Vivyan Biggers Graves-Bigelow, RN,BSN [phone]  

## 2015-03-26 NOTE — Progress Notes (Signed)
CRITICAL VALUE ALERT  Critical value received:  Troponin 2.14  Date of notification:  03/26/2015  Time of notification:  07:26  Critical value read back:Yes.    Nurse who received alert:  Shelba Flake, RN  MD notified (1st page): [place], [place]  Time of first page:  07:28  Responding MD: Ival Bible, [place]  Time MD responded:  07:33

## 2015-03-26 NOTE — ED Provider Notes (Signed)
CSN: [id number]     Arrival date & time 03/25/15  2144 History   First MD Initiated Contact with Patient 03/25/15 2148     Chief Complaint  Patient presents with  . Chest Pain     (Consider location/radiation/quality/duration/timing/severity/associated sxs/prior Treatment) Patient is a 74 y.o. male presenting with chest pain. The history is provided by the patient.  Chest Pain Pain location:  Substernal area Pain quality: crushing, pressure and radiating   Pain radiates to:  L arm Pain radiates to the back: no   Pain severity:  Severe Onset quality:  Sudden Duration:  45 minutes Timing:  Constant Progression:  Improving Chronicity:  New Context comment:  Started spontaneously while sitting on the porch Relieved by:  Aspirin and rest Worsened by:  Nothing tried Ineffective treatments:  None tried Associated symptoms: diaphoresis, nausea and shortness of breath   Associated symptoms: no abdominal pain, no cough, no fever, no lower extremity edema, no palpitations, not vomiting and no weakness   Risk factors: diabetes mellitus, hypertension and male sex   Risk factors: no prior DVT/PE, no smoking and no surgery     Past Medical History  Diagnosis Date  . COLONIC POLYPS, HX OF   . DIABETES MELLITUS     diet controlled  . DIVERTICULOSIS, COLON   . GOUT   . NEPHROLITHIASIS, HX OF     remote open stone-retrieval on L  . HYPERTENSION     off ACEI 2010 because of hyperkalemia in setting of ARI  . OSA on CPAP   . Cervical stenosis of spinal canal     fusion 2006  . Familial tremor 03/19/2014  . 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 Strangulated testicle  1960  . Left great toe Left x2- 1985 & 2004  . Anterior fusion cervical spine  2002  . Hernia repair  1962  . Kidney stones  1986  . Posterior fusion cervical spine  2006   Family History  Problem  Relation Age of Onset  . Heart disease Mother   . Hypertension Mother   . Heart disease Father   . Hypertension Father   . Prostate cancer Maternal Uncle   . Hypertension Maternal Grandmother   . Heart disease Maternal Grandmother   . Hypertension Maternal Grandfather   . Heart disease Maternal Grandfather   . Hypertension Paternal Grandfather   . Heart disease Paternal Grandfather   . Diabetes Paternal Grandfather   . Alcohol abuse Other   . Arthritis Other   . Diabetes Mother    Social History  Substance Use Topics  . Smoking status: Former Smoker    Quit date: 06/06/1990  . Smokeless tobacco: None     Comment: widowed 1991 - lives with SO [name] who is Therapist, sports  . Alcohol Use: Yes     Comment: glass of wine once a week    Review of Systems  Constitutional: Positive for diaphoresis. Negative for fever.  Respiratory: Positive for shortness of breath. Negative for cough.   Cardiovascular: Positive for chest pain. Negative for palpitations.  Gastrointestinal: Positive for nausea. Negative for vomiting and abdominal pain.  Neurological: Negative for weakness.  All other systems reviewed and are negative.     Allergies  Benzodiazepines; Diazepam; Mobic; Sertraline hcl; and Tramadol  Home Medications   Prior to Admission medications   Medication Sig Start Date End Date  Taking? Authorizing Provider  allopurinol (ZYLOPRIM) 100 MG tablet Take 1 tablet (100 mg total) by mouth 2 (two) times daily. 12/17/14  Yes Rowe Clack, MD  amLODipine (NORVASC) 10 MG tablet Take 1 tablet (10 mg total) by mouth daily. 03/02/15  Yes Rowe Clack, MD  Cholecalciferol (VITAMIN D3) 2000 UNITS TABS Take 2,000 Units by mouth daily.   Yes Historical Provider, MD  furosemide (LASIX) 40 MG tablet Take 40 mg by mouth every other day.    Yes Historical Provider, MD  hydrALAZINE (APRESOLINE) 50 MG tablet Take 1 tablet (50 mg total) by mouth 2 (two) times daily. 12/05/14  Yes Rowe Clack, MD   Ibuprofen-Famotidine 800-26.6 MG TABS Take 1 tablet by mouth 2 (two) times daily. 03/17/15  Yes Lyndal Pulley, DO  metoprolol (LOPRESSOR) 50 MG tablet Take 0.5 tablets (25 mg total) by mouth 2 (two) times daily. 01/12/15  Yes Rowe Clack, MD  nabumetone (RELAFEN) 500 MG tablet Take 1 tablet (500 mg total) by mouth 2 (two) times daily as needed. 03/17/15  Yes Rowe Clack, MD  NON FORMULARY Take 1 tablet by mouth 2 (two) times daily. tumeric   Yes Historical Provider, MD  primidone (MYSOLINE) 50 MG tablet Take two tablets in the morning, 1 in the evening 03/17/15  Yes Rebecca S [name], DO  ranitidine (ZANTAC) 150 MG tablet Take 1 tablet (150 mg total) by mouth 2 (two) times daily. 02/16/15  Yes Rowe Clack, MD  blood glucose meter kit and supplies KIT Use monitor to check blood sugar twice a day. DX E11.09 09/29/14   Rowe Clack, MD  Blood Glucose Monitoring Suppl (ONE TOUCH ULTRA 2) W/DEVICE KIT Test up to two times daily 09/29/14   Rowe Clack, MD  glucose blood (ONE TOUCH TEST STRIPS) test strip Test up to two times daily 09/29/14   Rowe Clack, MD  ONE TOUCH LANCETS MISC Test up to two times daily 09/29/14   Rowe Clack, MD   BP 164/59 mmHg  Pulse 44  Temp(Src) 99 F (37.2 C) (Oral)  Resp 14  Ht 5' 8"  (1.727 m)  Wt 240 lb (108.863 kg)  BMI 36.50 kg/m2  SpO2 99% Physical Exam  Constitutional: He is oriented to person, place, and time. He appears well-developed and well-nourished. No distress.  HENT:  Head: Normocephalic and atraumatic.  Mouth/Throat: Oropharynx is clear and moist.  Eyes: Conjunctivae and EOM are normal. Pupils are equal, round, and reactive to light.  Neck: Normal range of motion. Neck supple.  Cardiovascular: Normal rate, regular rhythm and intact distal pulses.   No murmur heard. Pulmonary/Chest: Effort normal and breath sounds normal. No respiratory distress. He has no wheezes. He has no rales.  Abdominal: Soft. He exhibits  no distension. There is no tenderness. There is no rebound and no guarding.  Musculoskeletal: Normal range of motion. He exhibits no edema or tenderness.  Neurological: He is alert and oriented to person, place, and time.  Skin: Skin is warm and dry. No rash noted. No erythema.  Psychiatric: He has a normal mood and affect. His behavior is normal.  Nursing note and vitals reviewed.   ED Course  Procedures (including critical care time) Labs Review Labs Reviewed  I-STAT CHEM 8, ED - Abnormal; Notable for the following:    BUN 27 (*)    Glucose, Bld 151 (*)    All other components within normal limits  CBC WITH DIFFERENTIAL/PLATELET  I-STAT TROPOININ,  ED    Imaging Review Dg Chest Port 1 View  03/25/2015  CLINICAL DATA:  Sudden onset of central chest pain 2 hours ago EXAM: PORTABLE CHEST - 1 VIEW COMPARISON:  None. FINDINGS: Cardiac shadow is at the upper limits of normal. The lungs are well aerated bilaterally. Healed rib fractures are seen on the right. No acute bony abnormality is noted. IMPRESSION: No acute abnormality seen. Electronically Signed   By: Inez Catalina M.D.   On: 03/25/2015 22:37   I have personally reviewed and evaluated these images and lab results as part of my medical decision-making.   EKG Interpretation   Date/Time:  Wednesday March 25 2015 21:50:36 EDT Ventricular Rate:  49 PR Interval:  194 QRS Duration: 106 QT Interval:  477 QTC Calculation: 431 R Axis:   63 Text Interpretation:  Sinus bradycardia Minimal ST depression, diffuse  leads Baseline wander in lead(s) V2 No significant change since last  tracing Confirmed by Maryan Rued  MD, Loree Fee ([id number]) on 03/25/2015 10:13:26  PM      MDM   Final diagnoses:  ACS (acute coronary syndrome) (HCC)    Pt with symptoms concerning for ACS.  Associated symptoms include diaphoresis, nausea, SOB and radiation down the left arm.  PERC. ASA given by EMS and NTG given here with resolution of pain. EKG with  sinus brady but no acute change, CXR, CBC, BMP without acute findings.  Trop 0.07. Low risk Wells criteria, no evidence or concern for infection. Low suspicion for dissection. Discussed cardiology and patient will be admitted for rule out    Blanchie Dessert, MD 03/26/15 [phone]

## 2015-03-26 NOTE — Progress Notes (Signed)
Patient's PMH significant for P450 CYP2D6 variant (confirmed heterozygous in May 2016).  Patient's wife reports Valium (metabolized by 2C19 and 3A4) caused patient to have stroke-like symptoms requiring full work-up, which was negative.  Since Lipitor is also metabolized by 3A4, I don't feel comfortable and had contacted patient's PCP Dr. Asa Lente for recommendation.  Dr. Asa Lente recommended low-dose Lipitor.  After discussing with Dr. Mare Ferrari, will change to Lipitor 10mg  PO daily at 180.  Dr. Ellyn Hack also aware of P450 deficiency and will use low-dose morphine and lidocaine instead of Fentanyl and Versed for cath today.    Troy Banks, PharmD, BCPS Pager:  [phone] 03/26/2015, 3:28 PM

## 2015-03-26 NOTE — Progress Notes (Signed)
Patient Name: Troy Banks Date of Encounter: 03/26/2015     Active Problems:   Unstable angina ([place])    SUBJECTIVE  The patient is not having any chest discomfort this morning.  His troponins are elevated at 2.14 this morning.  The patient was initially resistant to the idea of cardiac catheterization.  I spent a long time talking with him and his wife about the importance of determining the anatomic cause of his non-STEMI to guide future therapies.  He has agreed with proceeding with cardiac catheterization.  CURRENT MEDS . allopurinol  100 mg Oral BID  . amLODipine  10 mg Oral Daily  . [START ON 03/27/2015] aspirin EC  81 mg Oral Daily  . atorvastatin  80 mg Oral q1800  . carvedilol  6.25 mg Oral BID WC    OBJECTIVE  Filed Vitals:   03/26/15 0030 03/26/15 0045 03/26/15 0107 03/26/15 0501  BP: 153/52 160/59 161/69 161/59  Pulse: 46 44 46 43  Temp:   98.4 F (36.9 C) 98.7 F (37.1 C)  TempSrc:   Oral Oral  Resp: 18 18 20 18   Height:   5\' 8"  (1.727 m)   Weight:   231 lb 4.8 oz (104.917 kg)   SpO2: 95% 96% 97% 96%    Intake/Output Summary (Last 24 hours) at 03/26/15 0803 Last data filed at 03/25/15 2309  Gross per 24 hour  Intake      0 ml  Output    420 ml  Net   -420 ml   Filed Weights   03/25/15 2157 03/26/15 0107  Weight: 240 lb (108.863 kg) 231 lb 4.8 oz (104.917 kg)    PHYSICAL EXAM  General: Pleasant, NAD. Neuro: Alert and oriented X 3. Moves all extremities spontaneously. Psych: Normal affect. HEENT:  Normal  Neck: Aortic stenosis murmur radiates to both carotids Lungs:  Resp regular and unlabored, CTA. Heart: RRR no s3, s4, there is a grade 2/6 harsh systolic ejection murmur at the aortic area Abdomen: Soft, non-tender, non-distended, BS + x 4.  Extremities: No clubbing, cyanosis or edema. DP/PT/Radials 2+ and equal bilaterally.  Accessory Clinical Findings  CBC  Recent Labs  03/25/15 2225 03/25/15 2234 03/26/15 0343  WBC 7.7  --   8.2  NEUTROABS 5.1  --  5.0  HGB 15.5 17.0 15.0  HCT 45.2 50.0 44.1  MCV 90.4  --  91.1  PLT 172  --  623   Basic Metabolic Panel  Recent Labs  03/25/15 2234 03/26/15 0343  NA 139 142  K 3.9 4.0  CL 105 105  CO2  --  26  GLUCOSE 151* 115*  BUN 27* 19  CREATININE 1.00 1.09  CALCIUM  --  9.6   Liver Function Tests No results for input(s): AST, ALT, ALKPHOS, BILITOT, PROT, ALBUMIN in the last 72 hours. No results for input(s): LIPASE, AMYLASE in the last 72 hours. Cardiac Enzymes  Recent Labs  03/26/15 0629  TROPONINI 2.14*   BNP Invalid input(s): POCBNP D-Dimer No results for input(s): DDIMER in the last 72 hours. Hemoglobin A1C No results for input(s): HGBA1C in the last 72 hours. Fasting Lipid Panel  Recent Labs  03/26/15 0343  CHOL 184  HDL 36*  LDLCALC 119*  TRIG 144  CHOLHDL 5.1   Thyroid Function Tests  Recent Labs  03/26/15 0343  TSH 7.951*    TELE  Marked sinus bradycardia  ECG  26-Mar-2015 05:47:29 [facility] ROUTINE RECORD Marked sinus bradycardia with  1st degree A-V block ST & T wave abnormality, consider inferior ischemia Anterolateral ischemia Abnormal ECG Compared to yesterday, Anterolateral ischemia ST depression more pronounced Confirmed by Einar Gip MD, JAGADEESH ([id number]) on 03/26/2015 8:01:03 AM  Radiology/Studies  [place] Extrem Low Right Ltd  03/18/2015  Decided not to have injection, order entered in error.   Dg Chest Port 1 View  03/25/2015  CLINICAL DATA:  Sudden onset of central chest pain 2 hours ago EXAM: PORTABLE CHEST - 1 VIEW COMPARISON:  None. FINDINGS: Cardiac shadow is at the upper limits of normal. The lungs are well aerated bilaterally. Healed rib fractures are seen on the right. No acute bony abnormality is noted. IMPRESSION: No acute abnormality seen. Electronically Signed   By: Inez Catalina M.D.   On: 03/25/2015 22:37    ASSESSMENT AND PLAN  1.  Non-STEMI 2.  Aortic stenosis, uncertain  severity.  Patient declined to get echocardiogram when seen in the office last year. 3.  Hypertension 4.  Chronic sinus bradycardia 5.  Diabetes mellitus 6.  Hyperlipidemia  Plan: The patient has agreed to proceeding with left heart cardiac catheterization..The risks and benefits of a cardiac catheterization including, but not limited to, death, stroke, MI, kidney damage and bleeding were discussed with the patient who indicates understanding and agrees to proceed.  We will also get an echocardiogram.  Signed, Darlin Coco

## 2015-03-26 NOTE — Interval H&P Note (Signed)
History and Physical Interval Note:  03/26/2015 4:24 PM  Troy Banks  has presented today for surgery, with the diagnosis of NSTEMI, aortic stenosis  The various methods of treatment have been discussed with the patient and family. After consideration of risks, benefits and other options for treatment, the patient has consented to  Procedure(s): Left Heart Cath and Coronary Angiography (N/A) as a surgical intervention .  [name]  The patient's history has been reviewed, patient examined, no change in status, stable for surgery.  I have reviewed the patient's chart and labs.  Questions were answered to the patient's satisfaction.    Cath Lab Visit (complete for each Cath Lab visit)  Clinical Evaluation Leading to the Procedure:   ACS: Yes.    Non-ACS:    Anginal Classification: CCS IV  Anti-ischemic medical therapy: Maximal Therapy (2 or more classes of medications)   - but beta blocker is now D/C due to bradycardia  Non-Invasive Test Results: No non-invasive testing performed  Prior CABG: No previous CABG  TIMI Score  Patient Information:  TIMI Score is 4   UA/NSTEMI and intermediate-risk features (e.g., TIMI score 3-4) for short-term risk of death or nonfatal MI  Revascularization of the presumed culprit artery   A (8)  Indication: 10; Score: 8  Troy Banks, Troy Green, MD          Troy Banks, Troy Banks W

## 2015-03-26 NOTE — Progress Notes (Signed)
Site area: right groin  Site Prior to Removal:  Level 0  Pressure Applied For: 20 minutes Manual:   Yes Debbie rn  Patient Status During Pull:  stable Post Pull Site:  Level 0 Post Pull Instructions Given: yes   Post Pull Pulses Present: dp +2 Dressing Applied:  occlusive Bedrest begins @ 1820 Comments: resting comfortable

## 2015-03-26 NOTE — Progress Notes (Signed)
ANTICOAGULATION CONSULT NOTE - Follow Up Consult  Pharmacy Consult for heparin Indication: post cardiac cath  Allergies  Allergen Reactions  . Benzodiazepines     Patient states he gets stroke symptoms with benzo's.   . Coreg [Carvedilol]     Product contains c-450. Patient allergic to C-450 (stroke like symptoms)  . Diazepam Other (See Comments)    Stroke sx  . Lisinopril Other (See Comments)    Pt states C450, and hyperkalemia  . Mobic [Meloxicam]     Patient states he gets stroke symptoms with benzo's.   . Sertraline Hcl     Pt states all meds that are broken down with C-450- if so have sxs of stroke  . Tramadol Other (See Comments)    Unable to take 450    Patient Measurements: Height: 5\' 8"  (172.7 cm) Weight: 231 lb 4.8 oz (104.917 kg) IBW/kg (Calculated) : 68.4 Heparin Dosing Weight:   Vital Signs: Temp: 98.1 F (36.7 C) (10/20 1430) Temp Source: Oral (10/20 1430) BP: 157/69 mmHg (10/20 1822) Pulse Rate: 45 (10/20 1822)  Labs:  Recent Labs  03/25/15 2225 03/25/15 2234 03/26/15 0343 03/26/15 0629 03/26/15 0846 03/26/15 1030 03/26/15 1456  HGB 15.5 17.0 15.0  --   --   --   --   HCT 45.2 50.0 44.1  --   --   --   --   PLT 172  --  169  --   --   --   --   LABPROT  --   --  14.7  --   --   --   --   INR  --   --  1.13  --   --   --   --   HEPARINUNFRC  --   --   --   --  0.39  --  0.22*  CREATININE  --  1.00 1.09  --   --   --   --   TROPONINI  --   --   --  2.14*  --  1.85*  --     Estimated Creatinine Clearance: 71.9 mL/min (by C-G formula based on Cr of 1.09).   Medications:  Scheduled:  . allopurinol  100 mg Oral BID  . amLODipine  10 mg Oral Daily  . [START ON 03/27/2015] aspirin EC  81 mg Oral Daily  . atorvastatin  10 mg Oral q1800  . sodium chloride  3 mL Intravenous Q12H    Assessment: Heparin to be restarted 8 hrs after sheath pulled. Will restart at previous therapeutic rate. Goal of Therapy:  Heparin level 0.3-0.7 units/ml      Plan:  Heparin to restart at 0230 on 10/21 at 1300 units/hr Heparin level in 6 hrs. Daily heparin level and CBC.  Troy Banks 03/26/2015,6:56 PM

## 2015-03-26 NOTE — Care Management Note (Addendum)
Case Management Note  Patient Details  Name: NNAMDI DACUS MRN: [id number] Date of Birth: 1941-02-15  Subjective/Objective:    Pt admitted for chest pain. Plan for cardiac cath 03-26-15.                 Action/Plan: CM will continue to monitor for disposition needs.    Expected Discharge Date:                  Expected Discharge Plan:  Home/Self Care  In-House Referral:     Discharge planning Services  CM Consult  Post Acute Care Choice:    Choice offered to:     DME Arranged:    DME Agency:     HH Arranged:    HH Agency:     Status of Service:  In process, will continue to follow  Medicare Important Message Given:    Date Medicare IM Given:    Medicare IM give by:    Date Additional Medicare IM Given:    Additional Medicare Important Message give by:     If discussed at [place] of Stay Meetings, dates discussed:    Additional Comments: 1325 03-30-15 Jacqlyn Krauss, RN,BSN [phone] Severe CAD, NSTEMI- plan for Cabg 03-30-15. CM will continue to monitor for additional disposition needs.  Bethena Roys, RN 03/26/2015, 12:23 PM

## 2015-03-26 NOTE — Progress Notes (Addendum)
ANTICOAGULATION CONSULT NOTE - Follow Up Consult  Pharmacy Consult:  Heparin Indication: chest pain/ACS  Allergies  Allergen Reactions  . Benzodiazepines     Patient states he gets stroke symptoms with benzo's.   . Diazepam Other (See Comments)    Stroke sx  . Mobic [Meloxicam]     Patient states he gets stroke symptoms with benzo's.   . Sertraline Hcl     Pt states all meds that are broken down with C-450- if so have sxs of stroke  . Tramadol Other (See Comments)    Unable to take 450    Patient Measurements: Height: 5\' 8"  (172.7 cm) Weight: 231 lb 4.8 oz (104.917 kg) IBW/kg (Calculated) : 68.4 Heparin Dosing Weight: 92 kg  Vital Signs: Temp: 98.7 F (37.1 C) (10/20 0501) Temp Source: Oral (10/20 0501) BP: 161/59 mmHg (10/20 0501) Pulse Rate: 43 (10/20 0501)  Labs:  Recent Labs  03/25/15 2225 03/25/15 2234 03/26/15 0343 03/26/15 0629 03/26/15 0846  HGB 15.5 17.0 15.0  --   --   HCT 45.2 50.0 44.1  --   --   PLT 172  --  169  --   --   LABPROT  --   --  14.7  --   --   INR  --   --  1.13  --   --   HEPARINUNFRC  --   --   --   --  0.39  CREATININE  --  1.00 1.09  --   --   TROPONINI  --   --   --  2.14*  --     Estimated Creatinine Clearance: 71.9 mL/min (by C-G formula based on Cr of 1.09).    Assessment: 63 YOM to continue on IV heparin for ACS.  Heparin level is therapeutic; no bleeding reported.  Patient now agreeable to heart cath.   Goal of Therapy:  Heparin level 0.3-0.7 units/ml Monitor platelets by anticoagulation protocol: Yes    Plan:  - Continue heparin gtt at 1300 units/hr - Check confirmatory HL vs f/u post cath - Daily HL / CBC - F/U resume home meds    Jaley Yan D. Mina Marble, PharmD, BCPS Pager:  [phone] - [phone] 03/26/2015, 9:55 AM    ======================   Addendum: - HL 0.22, sub-therapeutic   Plan: - Will not adjust heparin rate and patient is going to cath now - F/U post cath   Lorraina Spring D. Mina Marble, PharmD, BCPS Pager:  [phone] 03/26/2015, 3:33 PM

## 2015-03-26 NOTE — H&P (View-Only) (Signed)
Patient Name: Troy Banks Date of Encounter: 03/26/2015     Active Problems:   Unstable angina ([place])    SUBJECTIVE  The patient is not having any chest discomfort this morning.  His troponins are elevated at 2.14 this morning.  The patient was initially resistant to the idea of cardiac catheterization.  I spent a long time talking with him and his wife about the importance of determining the anatomic cause of his non-STEMI to guide future therapies.  He has agreed with proceeding with cardiac catheterization.  CURRENT MEDS . allopurinol  100 mg Oral BID  . amLODipine  10 mg Oral Daily  . [START ON 03/27/2015] aspirin EC  81 mg Oral Daily  . atorvastatin  80 mg Oral q1800  . carvedilol  6.25 mg Oral BID WC    OBJECTIVE  Filed Vitals:   03/26/15 0030 03/26/15 0045 03/26/15 0107 03/26/15 0501  BP: 153/52 160/59 161/69 161/59  Pulse: 46 44 46 43  Temp:   98.4 F (36.9 C) 98.7 F (37.1 C)  TempSrc:   Oral Oral  Resp: 18 18 20 18   Height:   5\' 8"  (1.727 m)   Weight:   231 lb 4.8 oz (104.917 kg)   SpO2: 95% 96% 97% 96%    Intake/Output Summary (Last 24 hours) at 03/26/15 0803 Last data filed at 03/25/15 2309  Gross per 24 hour  Intake      0 ml  Output    420 ml  Net   -420 ml   Filed Weights   03/25/15 2157 03/26/15 0107  Weight: 240 lb (108.863 kg) 231 lb 4.8 oz (104.917 kg)    PHYSICAL EXAM  General: Pleasant, NAD. Neuro: Alert and oriented X 3. Moves all extremities spontaneously. Psych: Normal affect. HEENT:  Normal  Neck: Aortic stenosis murmur radiates to both carotids Lungs:  Resp regular and unlabored, CTA. Heart: RRR no s3, s4, there is a grade 2/6 harsh systolic ejection murmur at the aortic area Abdomen: Soft, non-tender, non-distended, BS + x 4.  Extremities: No clubbing, cyanosis or edema. DP/PT/Radials 2+ and equal bilaterally.  Accessory Clinical Findings  CBC  Recent Labs  03/25/15 2225 03/25/15 2234 03/26/15 0343  WBC 7.7  --   8.2  NEUTROABS 5.1  --  5.0  HGB 15.5 17.0 15.0  HCT 45.2 50.0 44.1  MCV 90.4  --  91.1  PLT 172  --  527   Basic Metabolic Panel  Recent Labs  03/25/15 2234 03/26/15 0343  NA 139 142  K 3.9 4.0  CL 105 105  CO2  --  26  GLUCOSE 151* 115*  BUN 27* 19  CREATININE 1.00 1.09  CALCIUM  --  9.6   Liver Function Tests No results for input(s): AST, ALT, ALKPHOS, BILITOT, PROT, ALBUMIN in the last 72 hours. No results for input(s): LIPASE, AMYLASE in the last 72 hours. Cardiac Enzymes  Recent Labs  03/26/15 0629  TROPONINI 2.14*   BNP Invalid input(s): POCBNP D-Dimer No results for input(s): DDIMER in the last 72 hours. Hemoglobin A1C No results for input(s): HGBA1C in the last 72 hours. Fasting Lipid Panel  Recent Labs  03/26/15 0343  CHOL 184  HDL 36*  LDLCALC 119*  TRIG 144  CHOLHDL 5.1   Thyroid Function Tests  Recent Labs  03/26/15 0343  TSH 7.951*    TELE  Marked sinus bradycardia  ECG  26-Mar-2015 05:47:29 [facility] ROUTINE RECORD Marked sinus bradycardia with  1st degree A-V block ST & T wave abnormality, consider inferior ischemia Anterolateral ischemia Abnormal ECG Compared to yesterday, Anterolateral ischemia ST depression more pronounced Confirmed by Einar Gip MD, JAGADEESH ([id number]) on 03/26/2015 8:01:03 AM  Radiology/Studies  [place] Extrem Low Right Ltd  03/18/2015  Decided not to have injection, order entered in error.   Dg Chest Port 1 View  03/25/2015  CLINICAL DATA:  Sudden onset of central chest pain 2 hours ago EXAM: PORTABLE CHEST - 1 VIEW COMPARISON:  None. FINDINGS: Cardiac shadow is at the upper limits of normal. The lungs are well aerated bilaterally. Healed rib fractures are seen on the right. No acute bony abnormality is noted. IMPRESSION: No acute abnormality seen. Electronically Signed   By: Inez Catalina M.D.   On: 03/25/2015 22:37    ASSESSMENT AND PLAN  1.  Non-STEMI 2.  Aortic stenosis, uncertain  severity.  Patient declined to get echocardiogram when seen in the office last year. 3.  Hypertension 4.  Chronic sinus bradycardia 5.  Diabetes mellitus 6.  Hyperlipidemia  Plan: The patient has agreed to proceeding with left heart cardiac catheterization..The risks and benefits of a cardiac catheterization including, but not limited to, death, stroke, MI, kidney damage and bleeding were discussed with the patient who indicates understanding and agrees to proceed.  We will also get an echocardiogram.  Signed, Darlin Coco

## 2015-03-26 NOTE — Brief Op Note (Signed)
   NAME:  Troy Banks   MRN: [id number] DOB:  February 03, 1941   ADMIT DATE: 03/25/2015  Brief Cardia Catheterization Note:  Indication: 1. NSTEMI 2. Moderate AS  Procedures: 1. Left Catheterization with Native Coroanry  Angiography via RIGHT COMMON FEMORAL Artery access  Medications:  200 mcg IC NTG  Contrast 70 mL  Impression:  Severe Native CAD:   Prox LAD ~40-50% before bifurcation into a large diagonal branch and follow on LAD. The on the branch there is an 80% long eccentric lesion. In the distal mid vessel it gives off a large septal trunk are tandem LAD. The follow on LAD is very tortuous with a 70% stenosis after a second bend.  Large proximal diagonal branch courses of the ramus intermedius and it is at least the same size as the LAD. There is a long eccentric 99% stenosis proximally and a distal roughly 60% focal lesion. The vessel bifurcates into a large distribution of the anterolateral wall.  Circumflex is large caliber vessel with 70% stenosis ostially and 90% mid stenosis just prior to a small OM1. It terminates as a large lateral OM 2 with about a 40% distal lesion.  The RCA is diffusely diseased. It has a proximal 50% lesion tapering to 60% and a 99% subtotal occlusion at the takeoff of the marginal branches. There is another 80% lesion distal to that. There is a PDA and PL that are relatively free of disease. There are left to right collaterals via septal perforators and the distal circumflex.  LV gram demonstrated normal EF of roughly 60-65%.  Recommendations:  Standard post femoral cath care  Restart IV heparin 8 hours post sheath removal; IV nitroglycerin  Recommend CT surgical consultation which will take place in the morning.  The patient is indicated that he very much would like to avoid surgery. Based on the amount of lesions and we are unsure how well he will do with antiplatelet agents, CT surgery is his best option.  Of the lesions seen, the only  likely not PCI amenable target is the ostial circumflex. Probably would not treat the distal LAD or distal diagonal lesions.   Full note to follow  Leonie Man, M.D., M.S. Interventional Cardiologist   Pager # [phone]  03/26/2015 6:23 PM

## 2015-03-26 NOTE — Progress Notes (Signed)
Patient lacks C-450 liver enzyme. Please double check all new meds to see if they are broken down by C-450, and if so patient cannot take them. Thank you

## 2015-03-26 NOTE — H&P (Signed)
Patient ID: Troy Banks MRN: [id number], DOB/AGE: [birth year]   Admit date: 03/25/2015   Primary Physician: [name], MD Primary Cardiologist: see EPIC  Pt. Profile:  Pt is a 74 yo M w/ a PMH significant for obesity, HL, HTN, and DMII p/w CP concerning for UA.  Problem List  Past Medical History  Diagnosis Date  . COLONIC POLYPS, HX OF   . DIABETES MELLITUS     diet controlled  . DIVERTICULOSIS, COLON   . GOUT   . NEPHROLITHIASIS, HX OF     remote open stone-retrieval on L  . HYPERTENSION     off ACEI 2010 because of hyperkalemia in setting of ARI  . OSA on CPAP   . Cervical stenosis of spinal canal     fusion 2006  . Familial tremor 03/19/2014  . 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 Strangulated testicle  1960  . Left great toe Left x2- 1985 & 2004  . Anterior fusion cervical spine  2002  . Hernia repair  1962  . Kidney stones  1986  . Posterior fusion cervical spine  2006    Allergies  Allergies  Allergen Reactions  . Benzodiazepines     Patient states he gets stroke symptoms with benzo's.   . Diazepam Other (See Comments)    Stroke sx  . Mobic [Meloxicam]     Patient states he gets stroke symptoms with benzo's.   . Sertraline Hcl     Pt states all meds that are broken down with C-450- if so have sxs of stroke  . Tramadol Other (See Comments)    Unable to take 450    HPI  Pt previously followed w/ cardiology for management of HTN. Has been poorly controlled by my chart review. Stress test ~10 years ago reportedly normal. No prior LHC. Earlier this week developed anginal-type CP. Similar symptoms today. Started a few hours before admission. Substernal pressure. +Diaphoresis, n/v, and SOB. Radiating to L arm. Last ~45 minutes. No similar prior symptoms. Called EMS. Second episode on route. Received full-dose ASA and SLN.  Currently CP free. EKG shows sub-millimeter diffuse ST depression and first trop neg. Cards consulted for admission.  Home Medications  Prior to Admission medications   Medication Sig Start Date End Date Taking? Authorizing Provider  allopurinol (ZYLOPRIM) 100 MG tablet Take 1 tablet (100 mg total) by mouth 2 (two) times daily. 12/17/14  Yes Rowe Clack, MD  amLODipine (NORVASC) 10 MG tablet Take 1 tablet (10 mg total) by mouth daily. 03/02/15  Yes Rowe Clack, MD  Cholecalciferol (VITAMIN D3) 2000 UNITS TABS Take 2,000 Units by mouth daily.   Yes Historical Provider, MD  furosemide (LASIX) 40 MG tablet Take 40 mg by mouth every other day.    Yes Historical Provider, MD  hydrALAZINE (APRESOLINE) 50 MG tablet Take 1 tablet (50 mg total) by mouth 2 (two) times daily. 12/05/14  Yes Rowe Clack, MD  Ibuprofen-Famotidine 800-26.6 MG TABS Take 1 tablet by mouth 2 (two) times daily. 03/17/15  Yes Lyndal Pulley, DO  metoprolol (LOPRESSOR) 50 MG tablet Take 0.5 tablets (25 mg total) by mouth 2 (two) times daily. 01/12/15  Yes Rowe Clack, MD  nabumetone (RELAFEN) 500 MG tablet Take 1 tablet (500 mg total) by mouth 2 (two) times daily as needed. 03/17/15  Yes Mateo Flow  Curlene Dolphin, MD  NON FORMULARY Take 1 tablet by mouth 2 (two) times daily. tumeric   Yes Historical Provider, MD  primidone (MYSOLINE) 50 MG tablet Take two tablets in the morning, 1 in the evening 03/17/15  Yes Rebecca S [name], DO  ranitidine (ZANTAC) 150 MG tablet Take 1 tablet (150 mg total) by mouth 2 (two) times daily. 02/16/15  Yes Rowe Clack, MD  blood glucose meter kit and supplies KIT Use monitor to check blood sugar twice a day. DX E11.09 09/29/14   Rowe Clack, MD  Blood Glucose Monitoring Suppl (ONE TOUCH ULTRA 2) W/DEVICE KIT Test up to two times daily 09/29/14   Rowe Clack, MD  glucose blood (ONE TOUCH TEST STRIPS) test strip Test up to two times daily 09/29/14   Rowe Clack, MD  ONE  TOUCH LANCETS MISC Test up to two times daily 09/29/14   Rowe Clack, MD    Family History  Family History  Problem Relation Age of Onset  . Heart disease Mother   . Hypertension Mother   . Heart disease Father   . Hypertension Father   . Prostate cancer Maternal Uncle   . Hypertension Maternal Grandmother   . Heart disease Maternal Grandmother   . Hypertension Maternal Grandfather   . Heart disease Maternal Grandfather   . Hypertension Paternal Grandfather   . Heart disease Paternal Grandfather   . Diabetes Paternal Grandfather   . Alcohol abuse Other   . Arthritis Other   . Diabetes Mother    Social History  Social History   Social History  . Marital Status: Divorced    Spouse Name: N/A  . Number of Children: N/A  . Years of Education: N/A   Occupational History  . Not on file.   Social History Main Topics  . Smoking status: Former Smoker    Quit date: 06/06/1990  . Smokeless tobacco: Not on file     Comment: widowed 1991 - lives with SO [name] who is Therapist, sports  . Alcohol Use: Yes     Comment: glass of wine once a week  . Drug Use: No  . Sexual Activity: Not on file   Other Topics Concern  . Not on file   Social History Narrative    Review of Systems General:  No chills, fever, night sweats or weight changes Cardiovascular:  Per HPI Dermatological: No rash, lesions/masses Respiratory: No cough, dyspnea Urologic: No hematuria, dysuria Abdominal:   No nausea, vomiting, diarrhea, bright red blood per rectum, melena, or hematemesis Neurologic:  No visual changes, wkns, changes in mental status All other systems reviewed and are otherwise negative except as noted above  Physical Exam  Blood pressure 169/66, pulse 45, temperature 99 F (37.2 C), temperature source Oral, resp. rate 15, height 5' 8"  (1.727 m), weight 240 lb (108.863 kg), SpO2 97 %.  General: Pleasant, NAD Psych: Normal affect. Neuro: Alert and oriented X 3. Moves all extremities  spontaneously. HEENT: Normal  Neck: Supple without bruits or JVD. Lungs:  Resp regular and unlabored, CTA. Heart: brady, II-III crescendo-decrescendo murmur radiating to clavicles, no rubs or gallops Abdomen: Soft, non-tender, non-distended, BS + x 4 Extremities: No clubbing, cyanosis or edema. DP/PT/Radials 2+ and equal bilaterally.  Labs  Troponin [facility] of Care Test)  Recent Labs  03/25/15 2242  TROPIPOC 0.07   No results for input(s): CKTOTAL, CKMB, TROPONINI in the last 72 hours. Lab Results  Component Value Date   WBC 7.7 03/25/2015  HGB 17.0 03/25/2015   HCT 50.0 03/25/2015   MCV 90.4 03/25/2015   PLT 172 03/25/2015    Recent Labs Lab 03/25/15 2234  NA 139  K 3.9  CL 105  BUN 27*  CREATININE 1.00  GLUCOSE 151*   Lab Results  Component Value Date   CHOL 161 09/08/2014   HDL 35.10* 09/08/2014   LDLCALC 91 10/24/2013   TRIG 207.0* 09/08/2014   No results found for: DDIMER   Radiology/Studies  [place] Extrem Low Right Ltd  03/18/2015  Decided not to have injection, order entered in error.   Dg Chest Port 1 View  03/25/2015  CLINICAL DATA:  Sudden onset of central chest pain 2 hours ago EXAM: PORTABLE CHEST - 1 VIEW COMPARISON:  None. FINDINGS: Cardiac shadow is at the upper limits of normal. The lungs are well aerated bilaterally. Healed rib fractures are seen on the right. No acute bony abnormality is noted. IMPRESSION: No acute abnormality seen. Electronically Signed   By: Inez Catalina M.D.   On: 03/25/2015 22:37   ECG  EKG - brady, diffuse ST depression  ASSESSMENT AND PLAN Pt is a 74 yo M w/ a PMH significant for obesity, HL, HTN, and DMII p/w CP concerning for UA.  #UA: Multiple risk factors including age, gender, and comorbidities. Good story for UA/NSTEMI. EKG shows no old MI and no acute ischemic changes compared to prior. First trop neg but symptoms recent. Will tx presumptively and make NPO for probable LHC tomorrow. Of note, pt is resistant to  PCI or CABG. -admit to tele -serial CE/EKG -TFTs, HgbA1c, lipid panel -TTE to assess LVEF -NPO for probable cath -loaded w/ ASA, continue ASA 81 mg PO q d -start heparin gtt, low-threshold for P2Y12 load if recurrent CP -switch metop to carvedilol for improved BP control -ACEI contraindicated as previously AKI/hyperkalemia -continue amlodipine, likely add HCTZ -start high-potency statin  #HL -check lipid panel -start high-potency statin  #HTN: Poorly controlled. Strange regimen not first-line per JNC 8. -per above  #DMII -check HgbA1c  #FEN/GI -NPO after midnight -IVF, replete lytes PRN  #PPx -heparin gtt -PPI not indicated  #Dispo -pending w/u and eval  Signed, Bari Edward, MD  03/26/2015, 12:34 AM

## 2015-03-27 ENCOUNTER — Other Ambulatory Visit: Payer: Self-pay | Admitting: *Deleted

## 2015-03-27 ENCOUNTER — Encounter (HOSPITAL_COMMUNITY): Payer: Self-pay | Admitting: Cardiology

## 2015-03-27 DIAGNOSIS — I252 Old myocardial infarction: Secondary | ICD-10-CM | POA: Diagnosis present

## 2015-03-27 DIAGNOSIS — I2510 Atherosclerotic heart disease of native coronary artery without angina pectoris: Secondary | ICD-10-CM

## 2015-03-27 DIAGNOSIS — I350 Nonrheumatic aortic (valve) stenosis: Secondary | ICD-10-CM

## 2015-03-27 HISTORY — DX: Nonrheumatic aortic (valve) stenosis: I35.0

## 2015-03-27 LAB — BASIC METABOLIC PANEL
Anion gap: 11 (ref 5–15)
BUN: 16 mg/dL (ref 6–20)
CHLORIDE: 103 mmol/L (ref 101–111)
CO2: 29 mmol/L (ref 22–32)
CREATININE: 1.09 mg/dL (ref 0.61–1.24)
Calcium: 9.6 mg/dL (ref 8.9–10.3)
GFR calc Af Amer: >60 mL/min (ref >60)
GFR calc non Af Amer: >60 mL/min (ref >60)
GLUCOSE: 81 mg/dL (ref 65–99)
POTASSIUM: 4.3 mmol/L (ref 3.5–5.1)
Sodium: 143 mmol/L (ref 135–145)

## 2015-03-27 LAB — CBC WITH DIFFERENTIAL/PLATELET
Basophils Absolute: 0 10*3/uL (ref 0.0–0.1)
Basophils Relative: 1 %
EOS ABS: 0.1 10*3/uL (ref 0.0–0.7)
EOS PCT: 2 %
HCT: 45.4 % (ref 39.0–52.0)
HEMOGLOBIN: 15.1 g/dL (ref 13.0–17.0)
LYMPHS ABS: 2.2 10*3/uL (ref 0.7–4.0)
LYMPHS PCT: 31 %
MCH: 30.4 pg (ref 26.0–34.0)
MCHC: 33.3 g/dL (ref 30.0–36.0)
MCV: 91.5 fL (ref 78.0–100.0)
MONOS PCT: 15 %
Monocytes Absolute: 1 10*3/uL (ref 0.1–1.0)
Neutro Abs: 3.8 10*3/uL (ref 1.7–7.7)
Neutrophils Relative %: 53 %
PLATELETS: 179 10*3/uL (ref 150–400)
RBC: 4.96 MIL/uL (ref 4.22–5.81)
RDW: 13.5 % (ref 11.5–15.5)
WBC: 7.2 10*3/uL (ref 4.0–10.5)

## 2015-03-27 LAB — HEPARIN LEVEL (UNFRACTIONATED)
HEPARIN UNFRACTIONATED: 0.19 [IU]/mL — AB (ref 0.30–0.70)
Heparin Unfractionated: 0.34 IU/mL (ref 0.30–0.70)

## 2015-03-27 LAB — GLUCOSE, CAPILLARY
GLUCOSE-CAPILLARY: 119 mg/dL — AB (ref 65–99)
Glucose-Capillary: 174 mg/dL — ABNORMAL HIGH (ref 65–99)

## 2015-03-27 LAB — HEMOGLOBIN A1C
HEMOGLOBIN A1C: 6 % — AB (ref 4.8–5.6)
MEAN PLASMA GLUCOSE: 126 mg/dL

## 2015-03-27 MED ORDER — PRIMIDONE 50 MG PO TABS
100.0000 mg | ORAL_TABLET | Freq: Every day | ORAL | Status: DC
  Administered 2015-03-27 – 2015-04-06 (×10): 100 mg via ORAL
  Filled 2015-03-27 (×11): qty 2

## 2015-03-27 MED ORDER — VITAMIN D3 25 MCG (1000 UNIT) PO TABS
2000.0000 [IU] | ORAL_TABLET | Freq: Every day | ORAL | Status: DC
  Administered 2015-03-27 – 2015-04-06 (×10): 2000 [IU] via ORAL
  Filled 2015-03-27 (×19): qty 2

## 2015-03-27 MED ORDER — PRIMIDONE 50 MG PO TABS
50.0000 mg | ORAL_TABLET | Freq: Every day | ORAL | Status: DC
  Administered 2015-03-27 – 2015-04-05 (×9): 50 mg via ORAL
  Filled 2015-03-27 (×13): qty 1

## 2015-03-27 MED ORDER — SIMETHICONE 80 MG PO CHEW
80.0000 mg | CHEWABLE_TABLET | Freq: Four times a day (QID) | ORAL | Status: DC | PRN
  Filled 2015-03-27: qty 1

## 2015-03-27 MED ORDER — NON FORMULARY: 150.0000 mg | Freq: Two times a day (BID) | Status: DC

## 2015-03-27 MED ORDER — INSULIN ASPART 100 UNIT/ML ~~LOC~~ SOLN: 0.0000 [IU] | Freq: Every day | SUBCUTANEOUS | Status: DC

## 2015-03-27 MED ORDER — ATORVASTATIN CALCIUM 20 MG PO TABS
20.0000 mg | ORAL_TABLET | Freq: Every day | ORAL | Status: DC
  Administered 2015-03-28 – 2015-04-05 (×8): 20 mg via ORAL
  Filled 2015-03-27 (×10): qty 1

## 2015-03-27 MED ORDER — HYDRALAZINE HCL 25 MG PO TABS
25.0000 mg | ORAL_TABLET | Freq: Three times a day (TID) | ORAL | Status: DC
  Administered 2015-03-27 – 2015-03-30 (×9): 25 mg via ORAL
  Filled 2015-03-27 (×11): qty 1

## 2015-03-27 MED ORDER — RANITIDINE HCL 150 MG PO TABS
150.0000 mg | ORAL_TABLET | Freq: Two times a day (BID) | ORAL | Status: DC
  Administered 2015-03-27 – 2015-04-06 (×18): 150 mg via ORAL
  Filled 2015-03-27 (×29): qty 1

## 2015-03-27 MED ORDER — ISOSORBIDE MONONITRATE 15 MG HALF TABLET
15.0000 mg | ORAL_TABLET | Freq: Every day | ORAL | Status: DC
  Administered 2015-03-27 – 2015-03-29 (×3): 15 mg via ORAL
  Filled 2015-03-27 (×3): qty 1

## 2015-03-27 MED ORDER — PRIMIDONE 50 MG PO TABS: 50.0000 mg | ORAL_TABLET | Freq: Two times a day (BID) | ORAL | Status: DC

## 2015-03-27 MED ORDER — HEPARIN (PORCINE) IN NACL 100-0.45 UNIT/ML-% IJ SOLN
1650.0000 [IU]/h | INTRAMUSCULAR | Status: DC
  Administered 2015-03-27 – 2015-03-29 (×3): 1650 [IU]/h via INTRAVENOUS
  Filled 2015-03-27 (×6): qty 250

## 2015-03-27 MED ORDER — INSULIN ASPART 100 UNIT/ML ~~LOC~~ SOLN: 0.0000 [IU] | Freq: Three times a day (TID) | SUBCUTANEOUS | Status: DC

## 2015-03-27 MED ORDER — ATORVASTATIN CALCIUM 40 MG PO TABS: 40.0000 mg | ORAL_TABLET | Freq: Every day | ORAL | Status: DC

## 2015-03-27 NOTE — Progress Notes (Signed)
ANTICOAGULATION CONSULT NOTE - Follow Up Consult  Pharmacy Consult:  Heparin Indication: chest pain/ACS  Allergies  Allergen Reactions  . Benzodiazepines     Patient states he gets stroke symptoms with benzo's.   . Coreg [Carvedilol]     Product contains c-450. Patient allergic to C-450 (stroke like symptoms)  . Diazepam Other (See Comments)    Stroke sx  . Lisinopril Other (See Comments)    Pt states C450, and hyperkalemia  . Mobic [Meloxicam]     Patient states he gets stroke symptoms with benzo's.   . Sertraline Hcl     Pt states all meds that are broken down with C-450- if so have sxs of stroke  . Tramadol Other (See Comments)    Unable to take 450    Patient Measurements: Height: 5\' 8"  (172.7 cm) Weight: 229 lb 9.6 oz (104.146 kg) IBW/kg (Calculated) : 68.4 Heparin Dosing Weight: 92 kg  Vital Signs: Temp: 98 F (36.7 C) (10/21 2008) Temp Source: Oral (10/21 2008) BP: 160/62 mmHg (10/21 2008) Pulse Rate: 52 (10/21 2008)  Labs:  Recent Labs  03/25/15 2225 03/25/15 2234 03/26/15 0343 03/26/15 0629  03/26/15 1030 03/26/15 1456 03/27/15 0339 03/27/15 0820 03/27/15 2035  HGB 15.5 17.0 15.0  --   --   --   --  15.1  --   --   HCT 45.2 50.0 44.1  --   --   --   --  45.4  --   --   PLT 172  --  169  --   --   --   --  179  --   --   LABPROT  --   --  14.7  --   --   --   --   --   --   --   INR  --   --  1.13  --   --   --   --   --   --   --   HEPARINUNFRC  --   --   --   --   < >  --  0.22*  --  0.19* 0.34  CREATININE  --  1.00 1.09  --   --   --   --  1.09  --   --   TROPONINI  --   --   --  2.14*  --  1.85*  --   --   --   --   < > = values in this interval not displayed.  Estimated Creatinine Clearance: 71.7 mL/min (by C-G formula based on Cr of 1.09).    Assessment: 67 YOM to continue on IV heparin for ACS, now s/p cath and awaiting CVTS evaluation.  Heparin level is sub-therapeutic; no bleeding reported.  2nd: Heparin level 0.34 on 1650 units/hr.  Continue current rate and check 8hr HL (to confirm). Hgb 15.1, PLT 179  Goal of Therapy:  Heparin level 0.3-0.7 units/ml Monitor platelets by anticoagulation protocol: Yes  Plan:  - Continue heparin IV @ 1650 units/hr - Check 8 hr HL (to confirm) - Daily HL / CBC  Tyrena Gohr C. Lennox Grumbles, PharmD Pharmacy Resident  Pager: [phone] 03/27/2015 9:03 PM

## 2015-03-27 NOTE — Consult Note (Signed)
[address]       [place] [address]             [phone]           [address]       [place] [address]             [phone]        Sisto D Armel Questa Medical Record #[id number] Date of Birth: 07-18-1940  Referring: No ref. provider found Primary Care: [name], MD  Chief Complaint:    Chief Complaint  Patient presents with  . Chest Pain    History of Present Illness:    The patient is a 74 year old male who presented to the emergency department with substernal chest pain which radiated to the left arm. Symptoms came on fairly suddenly and lasted for 45 minutes was described as crushing type pressure. He was sitting on his porch when he began end noted associated nausea, diaphoresis and shortness of breath. He has a history of diabetes mellitus, hyperlipidemia and hypertension. He was admitted for further evaluation and treatment due to the severity of these symptoms with a presumptive diagnosis of unstable angina. EKG did show bradycardia with diffuse ST depression. He did rule in for non-STEMI myocardial infarction. History also notes aortic stenosis of uncertain severity. He declined echocardiogram last year. He was taken for cardiac catheterization  and the following was noted:  Ost LAD to Prox LAD lesion, 45% stenosed.  Prox LAD to Mid LAD lesion, 80% stenosed.  1st Diag-1 lesion, 95% stenosed. 1st Diag-2 lesion, 55% stenosed.  Ost Cx lesion, 75% stenosed. Mid Cx lesion, 90% stenosed.  3rd OM, 60% stenosed.  Prox RCA lesion, 60% stenosed. Mid RCA-1 lesion, 99% stenosed. Mid RCA-2 lesion, 60% stenosed.  The left ventricular systolic function is normal. Cardiothoracic surgery's consult for consideration of CABG  Current Activity/ Functional Status: Patient is independent with mobility/ambulation, transfers, ADL's, IADL's.   [name] Score: At the time of surgery this patient's most appropriate  activity status/level should be described as: []     0    Normal activity, no symptoms [x]     1    Restricted in physical strenuous activity but ambulatory, able to do out light work []     2    Ambulatory and capable of self care, unable to do work activities, up and about                 more than 50%  Of the time                            []     3    Only limited self care, in bed greater than 50% of waking hours []     4    Completely disabled, no self care, confined to bed or chair []     5    Moribund  Past Medical History  Diagnosis Date  . COLONIC POLYPS, HX OF   . DIABETES MELLITUS     diet controlled  . DIVERTICULOSIS, COLON   . GOUT   . NEPHROLITHIASIS, HX OF     remote open stone-retrieval on L  . HYPERTENSION     off ACEI 2010 because of hyperkalemia in setting of ARI  . OSA on CPAP   . Cervical stenosis of spinal canal     fusion 2006  . Familial tremor 03/19/2014  .  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 Aortic stenosis, moderate 03/27/2015    Past Surgical History  Procedure Laterality Date  . Strangulated testicle  1960  . Left great toe Left x2- 1985 & 2004  . Anterior fusion cervical spine  2002  . Hernia repair  1962  . Kidney stones  1986  . Posterior fusion cervical spine  2006  . Cardiac catheterization N/A 03/26/2015    Procedure: Left Heart Cath and Coronary Angiography;  Surgeon: Leonie Man, MD;  Location: [facility];  Service: Cardiovascular;  Laterality: N/A;    History  Smoking status  . Former Smoker  . Quit date: 06/06/1990  Smokeless tobacco  . Not on file    Comment: widowed 1991 - lives with SO [name] who is RN    History  Alcohol Use  . Yes    Comment: glass of wine once a week    Social History   Social History  . Marital Status: Divorced    Spouse Name: N/A  . Number of Children: N/A  . Years of Education: N/A   Occupational History  . Retired  Animal nutritionist    Social History Main Topics  . Smoking status: Former Smoker    Quit date: 06/06/1990  . Smokeless tobacco: Not on file     Comment: widowed 1991 - lives with SO [name] who is Therapist, sports  . Alcohol Use: Yes     Comment: glass of wine once a week  . Drug Use: No  . Sexual Activity: Not on file   Other Topics Concern  . Not on file   Social History Narrative    Allergies  Allergen Reactions  . Benzodiazepines     Patient states he gets stroke symptoms with benzo's.   . Coreg [Carvedilol]     Product contains c-450. Patient allergic to C-450 (stroke like symptoms)  . Diazepam Other (See Comments)    Stroke sx  . Lisinopril Other (See Comments)    Pt states C450, and hyperkalemia  . Mobic [Meloxicam]     Patient states he gets stroke symptoms with benzo's.   . Sertraline Hcl     Pt states all meds that are broken down with C-450- if so have sxs of stroke  . Tramadol Other (See Comments)    Unable to take 450    Current Facility-Administered Medications  Medication Dose Route Frequency Provider Last Rate Last Dose  . 0.9 %  sodium chloride infusion  250 mL Intravenous PRN Leonie Man, MD      . acetaminophen (TYLENOL) tablet 650 mg  650 mg Oral Q4H PRN Jay Schlichter, MD   650 mg at 03/27/15 1007  . allopurinol (ZYLOPRIM) tablet 100 mg  100 mg Oral BID Jay Schlichter, MD   100 mg at 03/27/15 1008  . amLODipine (NORVASC) tablet 10 mg  10 mg Oral Daily Jay Schlichter, MD   10 mg at 03/27/15 1008  . aspirin EC tablet 81 mg  81 mg Oral Daily Jay Schlichter, MD   81 mg at 03/27/15 1213  . atorvastatin (LIPITOR) tablet 20 mg  20 mg Oral q1800 Rhonda G Barrett, [name]      . cholecalciferol (VITAMIN D) tablet 2,000 Units  2,000 Units Oral Daily Evelene Croon Barrett, [name]   2,000 Units at 03/27/15 1213  . heparin ADULT infusion 100 units/mL (25000 units/250 mL)  1,650 Units/hr Intravenous  Continuous Tyrone Apple, RPH 16.5 mL/hr at 03/27/15 1129 1,650 Units/hr at 03/27/15  1129  . hydrALAZINE (APRESOLINE) tablet 25 mg  25 mg Oral 3 times per day Evelene Croon Barrett, [name]   25 mg at 03/27/15 1213  . insulin aspart (novoLOG) injection 0-15 Units  0-15 Units Subcutaneous TID WC Rhonda G Barrett, [name]   0 Units at 03/27/15 1200  . insulin aspart (novoLOG) injection 0-5 Units  0-5 Units Subcutaneous QHS Rhonda G Barrett, [name]      . isosorbide mononitrate (IMDUR) 24 hr tablet 15 mg  15 mg Oral Daily Darlin Coco, MD   15 mg at 03/27/15 1214  . nitroGLYCERIN (NITROSTAT) SL tablet 0.4 mg  0.4 mg Sublingual Q5 Min x 3 PRN Jay Schlichter, MD      . ondansetron [facility]) injection 4 mg  4 mg Intravenous Q6H PRN Jay Schlichter, MD      . primidone (MYSOLINE) tablet 100 mg  100 mg Oral Daily Jaquita Folds, RPH   100 mg at 03/27/15 1216   And  . primidone (MYSOLINE) tablet 50 mg  50 mg Oral QHS Kendra P Hiatt, RPH      . ranitidine (ZANTAC) tablet 150 mg  150 mg Oral BID Gaynell Face Hiatt, RPH   150 mg at 03/27/15 1214  . simethicone (MYLICON) chewable tablet 80 mg  80 mg Oral QID PRN Rhonda G Barrett, [name]      . sodium chloride 0.9 % injection 3 mL  3 mL Intravenous Q12H Leonie Man, MD   3 mL at 03/27/15 1000  . sodium chloride 0.9 % injection 3 mL  3 mL Intravenous PRN Leonie Man, MD        Prescriptions prior to admission  Medication Sig Dispense Refill Last Dose  . allopurinol (ZYLOPRIM) 100 MG tablet Take 1 tablet (100 mg total) by mouth 2 (two) times daily. 180 tablet 3 03/25/2015 at Unknown time  . amLODipine (NORVASC) 10 MG tablet Take 1 tablet (10 mg total) by mouth daily. 90 tablet 1 03/25/2015 at Unknown time  . Cholecalciferol (VITAMIN D3) 2000 UNITS TABS Take 2,000 Units by mouth daily.   03/25/2015 at Unknown time  . furosemide (LASIX) 40 MG tablet Take 40 mg by mouth every other day.    03/24/2015 at Unknown time  . hydrALAZINE (APRESOLINE) 50 MG tablet Take 1 tablet (50 mg total) by mouth 2 (two) times daily. 180 tablet 3 03/25/2015 at Unknown time    . Ibuprofen-Famotidine 800-26.6 MG TABS Take 1 tablet by mouth 2 (two) times daily. 60 tablet 1 03/25/2015 at Unknown time  . nabumetone (RELAFEN) 500 MG tablet Take 1 tablet (500 mg total) by mouth 2 (two) times daily as needed. 60 tablet 1 03/25/2015 at Unknown time  . NON FORMULARY Take 1 tablet by mouth 2 (two) times daily. tumeric   03/25/2015 at Unknown time  . primidone (MYSOLINE) 50 MG tablet Take two tablets in the morning, 1 in the evening 270 tablet 1 03/25/2015 at Unknown time  . ranitidine (ZANTAC) 150 MG tablet Take 1 tablet (150 mg total) by mouth 2 (two) times daily. 180 tablet 3 03/25/2015 at Unknown time  . [DISCONTINUED] metoprolol (LOPRESSOR) 50 MG tablet Take 0.5 tablets (25 mg total) by mouth 2 (two) times daily. 90 tablet 1 03/25/2015 at 0800  . blood glucose meter kit and supplies KIT Use monitor to check blood sugar twice a day. DX E11.09 1 each 0 Taking  .  Blood Glucose Monitoring Suppl (ONE TOUCH ULTRA 2) W/DEVICE KIT Test up to two times daily 1 each 1 Taking  . glucose blood (ONE TOUCH TEST STRIPS) test strip Test up to two times daily 100 each 12 Taking  . ONE TOUCH LANCETS MISC Test up to two times daily 100 each 11 Taking    Family History  Problem Relation Age of Onset  . Heart disease Mother   . Hypertension Mother   . Heart disease Father   . Hypertension Father   . Prostate cancer Maternal Uncle   . Hypertension Maternal Grandmother   . Heart disease Maternal Grandmother   . Hypertension Maternal Grandfather   . Heart disease Maternal Grandfather   . Hypertension Paternal Grandfather   . Heart disease Paternal Grandfather   . Diabetes Paternal Grandfather   . Alcohol abuse Other   . Arthritis Other   . Diabetes Mother      Review of Systems:  Pertinent items are noted in HPI. also has chronic abdominal discomfort that workup states is IBS     Cardiac Review of Systems: Y or N  Chest Pain [   y ]  Resting SOB [n   ] Exertional SOB  [ y ]   Orthopnea [ n ]   Pedal Edema [ n ]    Palpitations [n  ] Syncope  [ n ]   Presyncope [ n  ]  General Review of Systems: [Y] = yes [  ]=no Constitional: recent weight change [ n ]; anorexia [  ]; fatigue [  ]; nausea [  ]; night sweats [  ]; fever [  ]; or chills [  ]                                                               Dental: poor dentition[no  ]; Last Dentist visit: not recently  Eye : blurred vision [  ]; diplopia [   ]; vision changes [  ];  Amaurosis fugax[  ]; Resp: cough [  ];  wheezing[ n ];  hemoptysis[ n ]; shortness of breath[  ]; paroxysmal nocturnal dyspnea[  ]; dyspnea on exertion[  ]; or orthopnea[  ];  GI:  gallstones[  ], vomiting[  ];  dysphagia[  ]; melena[  ];  hematochezia [  ]; heartburn[  ];   Hx of  Colonoscopy[  ]; GU: kidney stones [  ]; hematuria[  ];   dysuria [  ];  nocturia[  ];  history of     obstruction [  ]; urinary frequency [  ]             Skin: rash, swelling[  ];, hair loss[  ];  peripheral edema[  ];  or itching[  ]; Musculosketetal: myalgias[  ];  joint swelling[  ];  joint erythema[  ];  joint pain[  ];  back pain[  ];  Heme/Lymph: bruising[  ];  bleeding[  ];  anemia[  ];  Neuro: TIA[  ];  headaches[  ];  stroke[  ];  vertigo[  ];  seizures[  ];   paresthesias[  ];  difficulty walking[  ];  Psych:depression[  ]; anxiety[  ];  Endocrine: diabetes[ y ];  thyroid dysfunction[  ];  Immunizations: Flu [  ]; Pneumococcal[  ];  Other:  Physical Exam: BP 142/56 mmHg  Pulse 54  Temp(Src) 98.2 F (36.8 C) (Oral)  Resp 20  Ht 5' 8"  (1.727 m)  Wt 229 lb 9.6 oz (104.146 kg)  BMI 34.92 kg/m2  SpO2 97%   General appearance: alert, cooperative and no distress Head: Normocephalic, without obvious abnormality, atraumatic Neck: no adenopathy, no JVD, supple, symmetrical, trachea midline, thyroid not enlarged, symmetric, no tenderness/mass/nodules and + transmitted carotid murmur bilat, teeth ok Lymph nodes: Cervical, supraclavicular, and axillary  nodes normal. Resp: clear to auscultation bilaterally Back: symmetric, no curvature. ROM normal. No CVA tenderness. Cardio: regular rate and rhythm, S1, S2 normal and 3/6 Syst AO murmur GI: soft, mild distension , no organomegally, mild diffuse TTP Extremities: extremities normal, atraumatic, no cyanosis or edema and periph pulses intact Neurologic: Grossly normal  Diagnostic Studies & Laboratory data:     Recent Radiology Findings:   Dg Chest Port 1 View  03/25/2015  CLINICAL DATA:  Sudden onset of central chest pain 2 hours ago EXAM: PORTABLE CHEST - 1 VIEW COMPARISON:  None. FINDINGS: Cardiac shadow is at the upper limits of normal. The lungs are well aerated bilaterally. Healed rib fractures are seen on the right. No acute bony abnormality is noted. IMPRESSION: No acute abnormality seen. Electronically Signed   By: Inez Catalina M.D.   On: 03/25/2015 22:37              *[place], [place] [address]              [phone]  ------------------------------------------------------------------- Transthoracic Echocardiography  Patient:  Aasim, Restivo MR #:    [id number] Study Date: 03/26/2015 Gender:   M Age:    29 Height:   172.7 cm Weight:   108.4 kg BSA:    2.32 m^2 Pt. Status: Room:    [place], [place] SONOGRAPHER Laverna Peace Reel, RDCS PERFORMING  [facility], Inpatient ADMITTING  Irish Lack Lorenda Hatchet  cc:  ------------------------------------------------------------------- LV EF: 60% -  65%  ------------------------------------------------------------------- Indications:   Aortic stenosis 424.1.  ------------------------------------------------------------------- History:  PMH:  Chest pain. Aortic valve  disease. Risk factors: Hypertension.  ------------------------------------------------------------------- Study Conclusions  - Left ventricle: The cavity size was mildly dilated. Wall thickness was increased in a pattern of moderate LVH. Systolic function was normal. The estimated ejection fraction was in the range of 60% to 65%. Doppler parameters are consistent with elevated ventricular end-diastolic filling pressure. - Aortic valve: There was moderate stenosis. Valve area (VTI): 1.44 cm^2. Valve area (Vmax): 1.31 cm^2. Valve area (Vmean): 1.37 cm^2. - Mitral valve: Calcified annulus. There was mild regurgitation. - Left atrium: The atrium was mildly dilated. - Right atrium: The atrium was mildly dilated. - Atrial septum: No defect or patent foramen ovale was identified.  Transthoracic echocardiography. M-mode, complete 2D, spectral Doppler, and color Doppler. Birthdate: Patient birthdate: 10-14-1940. Age: Patient is 74 yr old. Sex: Gender: male. BMI: 36.3 kg/m^2. Blood pressure:   140/72 Patient status: Inpatient. Study date: Study date: 03/26/2015. Study time: 09:07 AM. Location: Bedside.  -------------------------------------------------------------------  ------------------------------------------------------------------- Left ventricle: The cavity size was mildly dilated. Wall thickness was increased in a pattern of moderate LVH. Systolic  function was normal. The estimated ejection fraction was in the range of 60% to 65%. Doppler parameters are consistent with elevated ventricular end-diastolic filling pressure.  ------------------------------------------------------------------- Aortic valve:  Severely calcified leaflets. Doppler:  There was moderate stenosis.   VTI ratio of LVOT to aortic valve: 0.42. Valve area (VTI): 1.44 cm^2. Indexed valve area (VTI): 0.62 cm^2/m^2. Peak velocity ratio of LVOT to aortic valve: 0.38. Valve area  (Vmax): 1.31 cm^2. Indexed valve area (Vmax): 0.56 cm^2/m^2. Mean velocity ratio of LVOT to aortic valve: 0.4. Valve area (Vmean): 1.37 cm^2. Indexed valve area (Vmean): 0.59 cm^2/m^2. Mean gradient (S): 21 mm Hg. Peak gradient (S): 44 mm Hg.  ------------------------------------------------------------------- Aorta: The aorta was normal, not dilated, and non-diseased.  ------------------------------------------------------------------- Mitral valve:  Calcified annulus. Doppler: There was mild regurgitation.  Peak gradient (D): 4 mm Hg.  ------------------------------------------------------------------- Left atrium: The atrium was mildly dilated.  ------------------------------------------------------------------- Atrial septum: No defect or patent foramen ovale was identified.  ------------------------------------------------------------------- Right ventricle: The cavity size was normal. Wall thickness was normal. Systolic function was normal.  ------------------------------------------------------------------- Pulmonic valve:  Doppler: There was trivial regurgitation.  ------------------------------------------------------------------- Tricuspid valve:  Doppler: There was mild regurgitation.  ------------------------------------------------------------------- Right atrium: The atrium was mildly dilated.  ------------------------------------------------------------------- Pericardium: The pericardium was normal in appearance.  ------------------------------------------------------------------- Post procedure conclusions Ascending Aorta:  - The aorta was normal, not dilated, and non-diseased.  ------------------------------------------------------------------- Measurements  Left ventricle              Value     Reference LV ID, ED, PLAX chordal          45  mm    43 - 52 LV ID, ES, PLAX chordal          31   mm    23 - 38 LV fx shortening, PLAX chordal      31  %    >=29 LV PW thickness, ED            14  mm    --------- IVS/LV PW ratio, ED            1       <=1.3 Stroke volume, 2D             117  ml    --------- Stroke volume/bsa, 2D           50  ml/m^2  --------- LV ejection fraction, 1-p A4C       62  %    --------- LV end-diastolic volume, 2-p       127  ml    --------- LV end-systolic volume, 2-p        48  ml    --------- LV ejection fraction, 2-p         62  %    --------- Stroke volume, 2-p            79  ml    --------- LV end-diastolic volume/bsa, 2-p     55  ml/m^2  --------- LV end-systolic volume/bsa, 2-p      21  ml/m^2  --------- Stroke volume/bsa, 2-p          34  ml/m^2  --------- LV e&', lateral              6.2  cm/s   --------- LV E/e&', lateral             17.1      --------- LV s&', lateral  6   cm/s   --------- LV e&', medial               4.68 cm/s   --------- LV E/e&', medial              22.65     --------- LV e&', average              5.44 cm/s   --------- LV E/e&', average             19.49     ---------  Ventricular septum            Value     Reference IVS thickness, ED             14  mm    ---------  LVOT                   Value     Reference LVOT ID, S                21  mm    --------- LVOT area                 3.46 cm^2   --------- LVOT peak velocity, S           125  cm/s   --------- LVOT mean velocity, S           81.8 cm/s   --------- LVOT VTI, S                33.8 cm     --------- LVOT peak gradient, S           6   mm Hg  ---------  Aortic valve               Value     Reference Aortic valve peak velocity, S       331  cm/s   --------- Aortic valve mean velocity, S       207  cm/s   --------- Aortic valve VTI, S            81.2 cm    --------- Aortic mean gradient, S          21  mm Hg  --------- Aortic peak gradient, S          44  mm Hg  --------- VTI ratio, LVOT/AV            0.42      --------- Aortic valve area, VTI          1.44 cm^2   --------- Aortic valve area/bsa, VTI        0.62 cm^2/m^2 --------- Velocity ratio, peak, LVOT/AV       0.38      --------- Aortic valve area, peak velocity     1.31 cm^2   --------- Aortic valve area/bsa, peak        0.56 cm^2/m^2 --------- velocity Velocity ratio, mean, LVOT/AV       0.4      --------- Aortic valve area, mean velocity     1.37 cm^2   --------- Aortic valve area/bsa, mean        0.59 cm^2/m^2 --------- velocity  Aorta                   Value     Reference Aortic root ID, ED            31  mm    ---------  Left atrium                Value     Reference LA ID, A-P, ES              40  mm    --------- LA ID/bsa, A-P              1.72 cm/m^2  <=2.2 LA volume, ES, 1-p A4C          102  ml    --------- LA volume/bsa, ES, 1-p A4C        43.9 ml/m^2  ---------  Mitral valve               Value     Reference Mitral E-wave peak velocity        106  cm/s   --------- Mitral A-wave peak velocity        116  cm/s   --------- Mitral deceleration time     (H)   354  ms    150 - 230 Mitral peak gradient, D          4    mm Hg  --------- Mitral E/A ratio, peak          0.91      ---------  Pulmonary arteries            Value     Reference PA pressure, S, DP            27  mm Hg  <=30  Tricuspid valve              Value     Reference Tricuspid regurg peak velocity      246  cm/s   --------- Tricuspid peak RV-RA gradient       24  mm Hg  ---------  Systemic veins              Value     Reference Estimated CVP               3   mm Hg  ---------  Right ventricle              Value     Reference TAPSE                   31.5 mm    --------- RV pressure, S, DP            27  mm Hg  <=30 RV s&', lateral, S             17  cm/s   ---------  Legend: (L) and (H) mark values outside specified reference range.  ------------------------------------------------------------------- Prepared and Electronically Authenticated by  Jenkins Rouge, M.D. 2016-10-20T10:25:44  I have independently reviewed the above radiologic studies.  Recent Lab Findings: Lab Results  Component Value Date   WBC 7.2 03/27/2015   HGB 15.1 03/27/2015   HCT 45.4 03/27/2015   PLT 179 03/27/2015   GLUCOSE 81 03/27/2015   CHOL 184 03/26/2015   TRIG 144 03/26/2015   HDL 36* 03/26/2015   LDLDIRECT 100.0 09/08/2014   LDLCALC 119* 03/26/2015   ALT 37 03/26/2015   AST 32 03/26/2015   NA 143 03/27/2015   K 4.3 03/27/2015   CL 103 03/27/2015   CREATININE 1.09 03/27/2015   BUN 16 03/27/2015   CO2 29 03/27/2015   TSH 7.951* 03/26/2015   INR 1.13 03/26/2015   HGBA1C 6.0* 03/26/2015  Coronary Findings    Dominance: Right   Left Main  The vessel was injectedis large. JL4 catheter used The vessel is tortuous.     Left Anterior Descending  The vessel is normal in caliber. The vessel is tortuous.   Colon Flattery LAD to  Prox LAD lesion, 45% stenosed. located proximal to major branch.   . Prox LAD to Mid LAD lesion, 80% stenosed. calcified diffuse.   . First Diagonal Branch   The vessel is large in size.   . 1st Diag-1 lesion, 95% stenosed. diffuse eccentric ulcerative.   . 1st Diag-2 lesion, 55% stenosed. discrete located at the bend.   . First Septal Branch   The vessel is small in size.   [name] Second Diagonal Branch   The vessel is small in size.   [name] Second Septal Branch   The vessel is moderate in size. Actually serves as a tandem LAD providing several smaller septal branches     Left Circumflex   . Ost Cx lesion, 75% stenosed. discrete.   . Mid Cx lesion, 90% stenosed. tubular eccentric ulcerative located proximal to major branch.   . First Obtuse Marginal Branch   The vessel is small in size. Very small   . Second Obtuse Marginal Branch   The vessel is small in size.   . Third Obtuse Marginal Branch   . 3rd Mrg lesion, 60% stenosed. discrete ulcerative.   . Lateral Third Obtuse Marginal Branch   The vessel is small in size.     Right Coronary Artery  The vessel was injectedis large. JR4 catheter   . Prox RCA lesion, 60% stenosed. diffuse eccentric. Tapers from 40% down to 65%   . Mid RCA-1 lesion, 99% stenosed. tubular eccentric ulcerative.   . Mid RCA-2 lesion, 60% stenosed. Somewhat hazy appearing   . Dist RCA lesion, 40% stenosed. discrete.   . Acute Marginal Branch   The vessel is small in size.   . Right Posterior Descending Artery   The vessel is moderate in size.   . Inferior Septal   The vessel is small in size. Inf Sept filled by collaterals from 1st Sept.   . Right Posterior Atrioventricular Branch   The vessel is small in size.   [name] Second Right Posterolateral   2nd RPLB filled by collaterals from Dist Cx.      Wall Motion                 Left Heart    Left Ventricle The left ventricular size is normal. The left ventricular systolic function is normal. The left  ventricular ejection fraction is 55-65% by visual estimate. There are no wall motion abnormalities in the left ventricle.   Aortic Valve , and trivial (1+) aortic regurgitation. Mild-moderate stenosis with a peak to peak gradient of roughly 20 mmHg The aortic valve is calcified. 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,  and previous issues with anesthesia                                      Patient has had  reaction to anesthesia   and remembers  reaction    to valium with urology procedure                                      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

## 2015-03-27 NOTE — Progress Notes (Addendum)
ANTICOAGULATION CONSULT NOTE - Follow Up Consult  Pharmacy Consult:  Heparin Indication: chest pain/ACS  Allergies  Allergen Reactions  . Benzodiazepines     Patient states he gets stroke symptoms with benzo's.   . Coreg [Carvedilol]     Product contains c-450. Patient allergic to C-450 (stroke like symptoms)  . Diazepam Other (See Comments)    Stroke sx  . Lisinopril Other (See Comments)    Pt states C450, and hyperkalemia  . Mobic [Meloxicam]     Patient states he gets stroke symptoms with benzo's.   . Sertraline Hcl     Pt states all meds that are broken down with C-450- if so have sxs of stroke  . Tramadol Other (See Comments)    Unable to take 450    Patient Measurements: Height: 5\' 8"  (172.7 cm) Weight: 229 lb 9.6 oz (104.146 kg) IBW/kg (Calculated) : 68.4 Heparin Dosing Weight: 92 kg  Vital Signs: Temp: 98.5 F (36.9 C) (10/21 0500) Temp Source: Oral (10/21 0500) BP: 152/57 mmHg (10/21 1008) Pulse Rate: 46 (10/21 0500)  Labs:  Recent Labs  03/25/15 2225 03/25/15 2234 03/26/15 0343 03/26/15 0629 03/26/15 0846 03/26/15 1030 03/26/15 1456 03/27/15 0339 03/27/15 0820  HGB 15.5 17.0 15.0  --   --   --   --  15.1  --   HCT 45.2 50.0 44.1  --   --   --   --  45.4  --   PLT 172  --  169  --   --   --   --  179  --   LABPROT  --   --  14.7  --   --   --   --   --   --   INR  --   --  1.13  --   --   --   --   --   --   HEPARINUNFRC  --   --   --   --  0.39  --  0.22*  --  0.19*  CREATININE  --  1.00 1.09  --   --   --   --  1.09  --   TROPONINI  --   --   --  2.14*  --  1.85*  --   --   --     Estimated Creatinine Clearance: 71.7 mL/min (by C-G formula based on Cr of 1.09).    Assessment: 41 YOM to continue on IV heparin for ACS, now s/p cath and awaiting CVTS evaluation.  Heparin level is sub-therapeutic; no bleeding reported.   Other: poor metabolizer of 2D6, but had stroke-like reaction to Valium (2C19, 3A4)   New Meds Lipitor (3A4) - dose  reduced to 10mg , then MD increased to 40mg  - monitor for muscle aches/pain, rhabdo Imdur (hepatic: denitration, glucuronidation, hydration) - okay to use  Potential New Med Plavix (2C19 >> 1A2, 2B6, 3A) Effient (hydrolysis in intestine then 3A, 2B6) Brilinta (3A4) - comes in 60mg  that could be used instead of usual dosage **regardless of which agent is used, recommend platelet activity monitoring weekly initially Lisinopril (renal, so no issue)   2D6 and drug dosing suggestions: https://www.http://www.rice.biz/   Goal of Therapy:  Heparin level 0.3-0.7 units/ml Monitor platelets by anticoagulation protocol: Yes    Plan:  - Increase heparin gtt to 1650 units/hr - Check 8 hr HL - Daily HL / CBC    Ricardo Schubach D. Mina Marble, PharmD, BCPS Pager:  [phone] 03/27/2015, 10:53 AM

## 2015-03-28 ENCOUNTER — Encounter (HOSPITAL_COMMUNITY): Payer: Self-pay | Admitting: Anesthesiology

## 2015-03-28 ENCOUNTER — Inpatient Hospital Stay (HOSPITAL_COMMUNITY): Payer: Medicare Other

## 2015-03-28 DIAGNOSIS — Z01810 Encounter for preprocedural cardiovascular examination: Secondary | ICD-10-CM

## 2015-03-28 DIAGNOSIS — I350 Nonrheumatic aortic (valve) stenosis: Secondary | ICD-10-CM

## 2015-03-28 DIAGNOSIS — I25110 Atherosclerotic heart disease of native coronary artery with unstable angina pectoris: Secondary | ICD-10-CM

## 2015-03-28 LAB — CBC WITH DIFFERENTIAL/PLATELET
Basophils Absolute: 0.1 10*3/uL (ref 0.0–0.1)
Basophils Relative: 1 %
Eosinophils Absolute: 0.2 10*3/uL (ref 0.0–0.7)
Eosinophils Relative: 2 %
HEMATOCRIT: 45.2 % (ref 39.0–52.0)
Hemoglobin: 15.2 g/dL (ref 13.0–17.0)
LYMPHS PCT: 36 %
Lymphs Abs: 2.7 10*3/uL (ref 0.7–4.0)
MCH: 30.6 pg (ref 26.0–34.0)
MCHC: 33.6 g/dL (ref 30.0–36.0)
MCV: 91.1 fL (ref 78.0–100.0)
MONO ABS: 0.9 10*3/uL (ref 0.1–1.0)
MONOS PCT: 13 %
NEUTROS ABS: 3.7 10*3/uL (ref 1.7–7.7)
Neutrophils Relative %: 48 %
Platelets: 170 10*3/uL (ref 150–400)
RBC: 4.96 MIL/uL (ref 4.22–5.81)
RDW: 13.5 % (ref 11.5–15.5)
WBC: 7.5 10*3/uL (ref 4.0–10.5)

## 2015-03-28 LAB — BASIC METABOLIC PANEL
ANION GAP: 12 (ref 5–15)
BUN: 12 mg/dL (ref 6–20)
CALCIUM: 9.7 mg/dL (ref 8.9–10.3)
CO2: 27 mmol/L (ref 22–32)
Chloride: 103 mmol/L (ref 101–111)
Creatinine, Ser: 1.02 mg/dL (ref 0.61–1.24)
GFR calc Af Amer: >60 mL/min (ref >60)
GFR calc non Af Amer: >60 mL/min (ref >60)
GLUCOSE: 102 mg/dL — AB (ref 65–99)
POTASSIUM: 4.2 mmol/L (ref 3.5–5.1)
Sodium: 142 mmol/L (ref 135–145)

## 2015-03-28 LAB — URINE MICROSCOPIC-ADD ON

## 2015-03-28 LAB — GLUCOSE, CAPILLARY
GLUCOSE-CAPILLARY: 108 mg/dL — AB (ref 65–99)
Glucose-Capillary: 123 mg/dL — ABNORMAL HIGH (ref 65–99)
Glucose-Capillary: 88 mg/dL (ref 65–99)
Glucose-Capillary: 102 mg/dL — ABNORMAL HIGH (ref 65–99)

## 2015-03-28 LAB — URINALYSIS, ROUTINE W REFLEX MICROSCOPIC
Bilirubin Urine: NEGATIVE
Glucose, UA: NEGATIVE mg/dL
Ketones, ur: NEGATIVE mg/dL
Leukocytes, UA: NEGATIVE
Nitrite: NEGATIVE
Protein, ur: NEGATIVE mg/dL
Specific Gravity, Urine: 1.003 — ABNORMAL LOW (ref 1.005–1.030)
Urobilinogen, UA: 0.2 mg/dL (ref 0.0–1.0)
pH: 7 (ref 5.0–8.0)

## 2015-03-28 LAB — HEPARIN LEVEL (UNFRACTIONATED): Heparin Unfractionated: 0.4 IU/mL (ref 0.30–0.70)

## 2015-03-28 NOTE — Progress Notes (Signed)
Paged Pharmacist to make sure all nighttime meds are safe with C-450 deficiency

## 2015-03-28 NOTE — Progress Notes (Signed)
ANTICOAGULATION CONSULT NOTE - Follow Up Consult  Pharmacy Consult:  Heparin Indication: chest pain/ACS  Allergies  Allergen Reactions  . Benzodiazepines     Patient states he gets stroke symptoms with benzo's.   . Coreg [Carvedilol]     Product contains c-450. Patient allergic to C-450 (stroke like symptoms)  . Diazepam Other (See Comments)    Stroke sx  . Lisinopril Other (See Comments)    Pt states C450, and hyperkalemia  . Mobic [Meloxicam]     Patient states he gets stroke symptoms with benzo's.   . Sertraline Hcl     Pt states all meds that are broken down with C-450- if so have sxs of stroke  . Tramadol Other (See Comments)    Unable to take 450    Patient Measurements: Height: 5\' 8"  (172.7 cm) Weight: 233 lb 11 oz (106 kg) IBW/kg (Calculated) : 68.4 Heparin Dosing Weight: 92 kg  Vital Signs: Temp: 98.2 F (36.8 C) (10/22 0431) Temp Source: Oral (10/22 0431) BP: 138/55 mmHg (10/22 0854) Pulse Rate: 45 (10/22 0431)  Labs:  Recent Labs  03/26/15 0343 03/26/15 0629  03/26/15 1030  03/27/15 0339 03/27/15 0820 03/27/15 2035 03/28/15 0518  HGB 15.0  --   --   --   --  15.1  --   --  15.2  HCT 44.1  --   --   --   --  45.4  --   --  45.2  PLT 169  --   --   --   --  179  --   --  170  LABPROT 14.7  --   --   --   --   --   --   --   --   INR 1.13  --   --   --   --   --   --   --   --   HEPARINUNFRC  --   --   < >  --   < >  --  0.19* 0.34 0.40  CREATININE 1.09  --   --   --   --  1.09  --   --  1.02  TROPONINI  --  2.14*  --  1.85*  --   --   --   --   --   < > = values in this interval not displayed.  Estimated Creatinine Clearance: 77.2 mL/min (by C-G formula based on Cr of 1.02).    Assessment: 7 YOM to continue on IV heparin for ACS, now s/p cath and may proceed with CABG +/- AVR.  Heparin level is therapeutic; no bleeding reported.   Other: poor metabolizer of 2D6 (expressed in liver, but also CNS), had stroke-like reaction to Valium (2C19, 3A4)    New Meds Lipitor (3A4) - low dose - monitor for muscle aches/pain, rhabdo Imdur (hepatic: denitration, glucuronidation, hydration)  Potential New Med Plavix (2C19 >> 1A2, 2B6, 3A) Effient (hydrolysis in intestine then 3A, 2B6) Brilinta (3A4) - weak inhibitor of 2D6 in vitro  **regardless of which agent is used, recommend platelet activity monitoring weekly initially Lisinopril (renal, so no issue)  2D6 and drug dosing suggestions:   https://www.http://www.rice.biz/,  http://www.[facility].com/it-mmfiles/Cytochrome_P450_2D6_Known_Drug_Interaction_Chart.pdf   Goal of Therapy:  Heparin level 0.3-0.7 units/ml Monitor platelets by anticoagulation protocol: Yes    Plan:  - Continue heparin gtt at 1650 units/hr - Daily HL / CBC - F/U with plans    Yamaira Spinner D. Mina Marble, PharmD, Star Pager:  [phone] -  [phone] 03/28/2015, 10:28 AM

## 2015-03-28 NOTE — Progress Notes (Signed)
Pre-op Cardiac Surgery  Carotid Findings:  1-39% ICA stenosis.  ECA stenosis.  Vertebral artery flow is antegrade.   Upper Extremity Right Left  Brachial Pressures 168T 153T  Radial Waveforms T T  Ulnar Waveforms T T  Palmar Arch (Allen's Test) WNL WNL   Findings:      Lower  Extremity Right Left  Dorsalis Pedis    Anterior Tibial 117M 147M  Posterior Tibial 152B 149M  Ankle/Brachial Indices 0.9 0.89    Findings:  ABI indicates mild reduction in arterial flow.

## 2015-03-28 NOTE — Progress Notes (Signed)
Patient has been ambulating independently in the halls without difficulty or angina.  Instructed to report any angina symptoms to nursing staff.  Pre-op education done, given handout, and given education channel info to watch pre-op CABG video.  Patient's daughter is an ICU nurse and is familiar with CABG surgery. [id number]

## 2015-03-28 NOTE — Consult Note (Signed)
Troy Banks is a 74 year old male with with Type 2 DM, Htn, OSA, and obesity who was admitted on 03/26/15 with substernal chest pain radiating to the left arm. He ruled in for a non-STEMI and was found to have extensive 3V CAD with preserved LV systolic function and mild-moderate aortic stenosis.  He is now scheduled to undergo CABG and possible AVR on 10/24 by Dr. Servando Snare.   He states that in 2002 following cervical spine surgery, he developed confusion, drowsiness, and R. Facial weakness while taking PO valium as an outpatient. His symptoms resolved after the valium was discontinued. He also states that he had awareness under anesthesia during kidney stone surgery.   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 From a cursory review of the literature, it appears this deficiency can occur in up to 6-10% of the caucasian population and results in reduced activity of the CYP2D^ enzyme.  This deficiency can result in the reduced metabolism of codeine , tramadol, SSRI inhibitors, beta blockers, ondanestron, and lidocaine among other meds. I have not found any contraindications in the literature to using benzodiazepines, fentanyl, volatile agents, propofol, muscle relaxants or other commonly used anesthetic agents in patients with this enzyme deficiency.   Diazepam (Valium) is very lipid soluble and can accumulate in the body if given for an extended period of time.   Result: Heterozygous positive for the CYP2D6*4 Null Variant.   Tamoxifen status: Reduced effectiveness. Alternative therapies should be considered for patients wit this genotype.  Interpretation: DNA analysis indicates that this patient is positive for one copy of the CYP2D6*4 null variant. This patient is negative for a duplicated RRN1A5 gene. Therefore, this individual is predicted to have the heterozygous extensive metabolizer (EM) phonotype. These individuals have reduced levels of CYP2D6  activity.        Specimen Collected: 10/24/14 Last Resulted: 11/20/14 3:34 PM                  I discussed the anesthetic plan at length with Troy Banks and explained to him that we would take all the usual precautions to prevent awareness during surgery and we will take into consideration his CYP2D6 deficiency when formulating his anesthetic regimen. I will also continue to review the  literature on anesthetic implications of BXU3Y3 deficiency.  Roberts Gaudy

## 2015-03-28 NOTE — Progress Notes (Addendum)
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  Nervous about anesthesia in setting of CP450 CYP2D6 variant.  CURRENT MEDS . allopurinol  100 mg Oral BID  . amLODipine  10 mg Oral Daily  . aspirin EC  81 mg Oral Daily  . atorvastatin  20 mg Oral q1800  . cholecalciferol  2,000 Units Oral Daily  . hydrALAZINE  25 mg Oral 3 times per day  . insulin aspart  0-15 Units Subcutaneous TID WC  . insulin aspart  0-5 Units Subcutaneous QHS  . isosorbide mononitrate  15 mg Oral Daily  . primidone  100 mg Oral Daily   And  . primidone  50 mg Oral QHS  . ranitidine  150 mg Oral BID  . sodium chloride  3 mL Intravenous Q12H    OBJECTIVE  Filed Vitals:   03/27/15 1317 03/27/15 2008 03/28/15 0431 03/28/15 0854  BP: 142/56 160/62 149/73 138/55  Pulse: 54 52 45   Temp: 98.2 F (36.8 C) 98 F (36.7 C) 98.2 F (36.8 C)   TempSrc: Oral Oral Oral   Resp: 20     Height:      Weight:   233 lb 11 oz (106 kg)   SpO2: 97% 96% 96%     Intake/Output Summary (Last 24 hours) at 03/28/15 1015 Last data filed at 03/28/15 0432  Gross per 24 hour  Intake      0 ml  Output    600 ml  Net   -600 ml   Filed Weights   03/26/15 0107 03/27/15 0500 03/28/15 0431  Weight: 231 lb 4.8 oz (104.917 kg) 229 lb 9.6 oz (104.146 kg) 233 lb 11 oz (106 kg)    PHYSICAL EXAM  General: Pleasant, NAD. Neuro: Alert and oriented X 3. Moves all extremities spontaneously. Psych: Normal affect. HEENT:  Normal  Neck: Supple without bruits or JVD. Lungs:  Resp regular and unlabored, CTA. Heart: RRR no s3, s4, 3/6  SEM loudest @ RUSB - heard throughout. Abdomen: Soft, non-tender, non-distended, BS + x 4.  Extremities: No clubbing, cyanosis or edema. DP/PT/Radials 2+ and equal bilaterally.  Bilateral femoral bruits.  Accessory Clinical Findings  CBC  Recent Labs  03/27/15 0339 03/28/15 0518  WBC 7.2 7.5  NEUTROABS 3.8 3.7  HGB 15.1 15.2  HCT 45.4 45.2  MCV 91.5 91.1  PLT 179 425   Basic Metabolic Panel  Recent Labs  03/27/15 0339 03/28/15 0518  NA 143 142  K 4.3 4.2  CL 103 103  CO2 29 27  GLUCOSE 81 102*  BUN 16 12  CREATININE 1.09 1.02  CALCIUM 9.6 9.7   Liver Function Tests  Recent Labs  03/26/15 0629  AST 32  ALT 37  ALKPHOS 34*  BILITOT 1.3*  PROT 6.5  ALBUMIN 3.9   Cardiac Enzymes  Recent Labs  03/26/15 0629 03/26/15 1030  TROPONINI 2.14* 1.85*   Hemoglobin A1C  Recent Labs  03/26/15 0343  HGBA1C 6.0*   Fasting Lipid Panel  Recent Labs  03/26/15 0343  CHOL 184  HDL 36*  LDLCALC 119*  TRIG 144  CHOLHDL 5.1   Thyroid Function Tests  Recent Labs  03/26/15 0343  TSH 7.951*    TELE  Sinus brady.  Radiology/Studies  Dg Chest Port 1 View  03/25/2015  CLINICAL DATA:  Sudden onset of central chest pain 2 hours ago EXAM: PORTABLE CHEST - 1 VIEW COMPARISON:  None. FINDINGS: Cardiac shadow is at the upper limits of normal. The lungs are well aerated bilaterally. Healed rib fractures are seen on the right. No acute bony abnormality is noted. IMPRESSION: No acute abnormality seen. Electronically Signed   By: Troy Banks M.D.   On: 03/25/2015 22:37   Cardiac Catheterization 03/26/2015    2D Echocardiogram 03/26/2015  Study Conclusions  - Left ventricle: The cavity size was mildly dilated. Wall   thickness was increased in a pattern of moderate LVH. Systolic   function was normal. The estimated ejection fraction was in the   range of 60% to 65%. Doppler parameters are consistent with   elevated ventricular end-diastolic filling  pressure. - Aortic valve: There was moderate stenosis. Valve area (VTI): 1.44   cm^2. Valve area (Vmax): 1.31 cm^2. Valve area (Vmean): 1.37   cm^2. - Mitral valve: Calcified annulus. There was mild regurgitation. - Left atrium: The atrium was mildly dilated. - Right atrium: The atrium was mildly dilated. - Atrial septum: No defect or patent foramen ovale was identified. _____________   ASSESSMENT AND PLAN  1. NSTEMI/ACS/CAD: s/p cath on 10/20 revealing severe multi-vessel CAD.  Seen by CT surgery yesterday (attending note pending) - tentatively felt to be a surgical candidate.  No further chest pain or dyspnea.  Cont ASA/statin.  No bb 2/2 baseline bradycardia.  Consider adding plavix POST-CABG in setting of ACS.  2.  Essential HTN:  BP slightly better this AM.  Hydralazine added yesterday.  Follow today and titrate further if necessary.  In the long run, would benefit from acei with ACS and borderline DM.  3.  HL:  LDL 119.  Switched to lipitor 20 2/2 cytochrome p450 CYP2D6 variant.  LFT's wnl.  4.  Diet controlled DM:  HbA1c = 6.0.  Cont SSI.  5.  Mod Ao Stenosis:  Peak gradient of 20 mmHg by cath.  ? AVR @ time of CABG.  CT surgery to address.   6.  GERD:  Cont zantac.  7.  Bilateral Femoral Bruits:  Denies any h/o claudication.  Strong distal pulses.  Outpt ABI's following recovery from surgery.  Cont ASA/Statin.  8.  Elevated TSH:  F/U FT4. ? Sick euthyroid.  Signed, Troy Hodgkins NP Patient seen and examined and history reviewed. Agree with above findings and plan. Patient is pain free. Awaiting talk with Troy Banks. VSS. Questions answered. Doppler studies pending. Plan CABG with probable AVR this week. He has moderate aortic stenosis by Echo.  Troy Banks, [place] 03/28/2015 12:32 PM

## 2015-03-28 NOTE — Anesthesia Preprocedure Evaluation (Addendum)
Anesthesia Evaluation  Patient identified by MRN, date of birth, ID band Patient awake    Reviewed: Allergy & Precautions, H&P , NPO status , Patient's Chart, lab work & pertinent test results  History of Anesthesia Complications Negative for: history of anesthetic complications  Airway Mallampati: II  TM Distance: >3 FB Neck ROM: full    Dental no notable dental hx.    Pulmonary neg pulmonary ROS, sleep apnea , former smoker,    Pulmonary exam normal breath sounds clear to auscultation       Cardiovascular hypertension, + angina + CAD and + Past MI  negative cardio ROS Normal cardiovascular exam+ Valvular Problems/Murmurs MR and AS  Rhythm:regular Rate:Normal     Neuro/Psych Depression PTSDnegative neurological ROS     GI/Hepatic negative GI ROS, Neg liver ROS,   Endo/Other  negative endocrine ROSdiabetes  Renal/GU negative Renal ROS     Musculoskeletal   Abdominal   Peds  Hematology negative hematology ROS (+)   Anesthesia Other Findings Pt is CYPD2 heterozygote, refer to excellent pharmacy and Anesthesiology records from 03/28/15  Reproductive/Obstetrics negative OB ROS                             Anesthesia Physical Anesthesia Plan  ASA: IV  Anesthesia Plan: General   Post-op Pain Management:    Induction: Intravenous  Airway Management Planned: Oral ETT  Additional Equipment: CVP, Arterial line, TEE, PA Cath and Ultrasound Guidance Line Placement  Intra-op Plan:   Post-operative Plan: Extubation in OR  Informed Consent: I have reviewed the patients History and Physical, chart, labs and discussed the procedure including the risks, benefits and alternatives for the proposed anesthesia with the patient or authorized representative who has indicated his/her understanding and acceptance.   Dental Advisory Given  Plan Discussed with: Anesthesiologist, CRNA and  Surgeon  Anesthesia Plan Comments: (Will plan to avoid precedex per pharmacy guidelines, use propofol post procedure for sedation in addition to fentanyl prn while weaning from ventilator, routinely done at other instituitions -fent, midazolam, amicar, heparin, protamine, morphine, propofol all fine)        Anesthesia Quick Evaluation

## 2015-03-29 LAB — CBC WITH DIFFERENTIAL/PLATELET
Basophils Absolute: 0 10*3/uL (ref 0.0–0.1)
Basophils Relative: 1 %
EOS ABS: 0.2 10*3/uL (ref 0.0–0.7)
EOS PCT: 3 %
HCT: 42.3 % (ref 39.0–52.0)
Hemoglobin: 14.5 g/dL (ref 13.0–17.0)
LYMPHS ABS: 2.3 10*3/uL (ref 0.7–4.0)
LYMPHS PCT: 37 %
MCH: 30.8 pg (ref 26.0–34.0)
MCHC: 34.3 g/dL (ref 30.0–36.0)
MCV: 89.8 fL (ref 78.0–100.0)
MONO ABS: 0.7 10*3/uL (ref 0.1–1.0)
MONOS PCT: 12 %
Neutro Abs: 2.9 10*3/uL (ref 1.7–7.7)
Neutrophils Relative %: 47 %
PLATELETS: 176 10*3/uL (ref 150–400)
RBC: 4.71 MIL/uL (ref 4.22–5.81)
RDW: 13.3 % (ref 11.5–15.5)
WBC: 6 10*3/uL (ref 4.0–10.5)

## 2015-03-29 LAB — COMPREHENSIVE METABOLIC PANEL
ALT: 30 U/L (ref 17–63)
AST: 23 U/L (ref 15–41)
Albumin: 3.7 g/dL (ref 3.5–5.0)
Alkaline Phosphatase: 42 U/L (ref 38–126)
Anion gap: 8 (ref 5–15)
BUN: 11 mg/dL (ref 6–20)
CO2: 25 mmol/L (ref 22–32)
Calcium: 9.1 mg/dL (ref 8.9–10.3)
Chloride: 103 mmol/L (ref 101–111)
Creatinine, Ser: 0.99 mg/dL (ref 0.61–1.24)
GFR calc Af Amer: >60 mL/min (ref >60)
GFR calc non Af Amer: >60 mL/min (ref >60)
Glucose, Bld: 118 mg/dL — ABNORMAL HIGH (ref 65–99)
Potassium: 3.8 mmol/L (ref 3.5–5.1)
Sodium: 136 mmol/L (ref 135–145)
Total Bilirubin: 1 mg/dL (ref 0.3–1.2)
Total Protein: 6.2 g/dL — ABNORMAL LOW (ref 6.5–8.1)

## 2015-03-29 LAB — GLUCOSE, CAPILLARY
GLUCOSE-CAPILLARY: 104 mg/dL — AB (ref 65–99)
GLUCOSE-CAPILLARY: 94 mg/dL (ref 65–99)
Glucose-Capillary: 112 mg/dL — ABNORMAL HIGH (ref 65–99)
Glucose-Capillary: 94 mg/dL (ref 65–99)

## 2015-03-29 LAB — PROTIME-INR
INR: 1.15 (ref 0.00–1.49)
Prothrombin Time: 14.9 seconds (ref 11.6–15.2)

## 2015-03-29 LAB — HEPARIN LEVEL (UNFRACTIONATED): HEPARIN UNFRACTIONATED: 0.46 [IU]/mL (ref 0.30–0.70)

## 2015-03-29 LAB — ABO/RH: ABO/RH(D): B POS

## 2015-03-29 LAB — T4, FREE: Free T4: 0.78 ng/dL (ref 0.61–1.12)

## 2015-03-29 MED ORDER — ATENOLOL 12.5 MG HALF TABLET: 12.5000 mg | ORAL_TABLET | Freq: Once | ORAL | Status: DC

## 2015-03-29 MED ORDER — NITROGLYCERIN IN D5W 200-5 MCG/ML-% IV SOLN
2.0000 ug/min | INTRAVENOUS | Status: AC
  Administered 2015-03-30: 15 ug/min via INTRAVENOUS
  Filled 2015-03-29: qty 250

## 2015-03-29 MED ORDER — MAGNESIUM SULFATE 50 % IJ SOLN
40.0000 meq | INTRAMUSCULAR | Status: DC
  Filled 2015-03-29: qty 10

## 2015-03-29 MED ORDER — SODIUM CHLORIDE 0.9 % IV SOLN
INTRAVENOUS | Status: DC
  Filled 2015-03-29: qty 30

## 2015-03-29 MED ORDER — CHLORHEXIDINE GLUCONATE CLOTH 2 % EX PADS
6.0000 | MEDICATED_PAD | Freq: Once | CUTANEOUS | Status: AC
  Administered 2015-03-29: 6 via TOPICAL

## 2015-03-29 MED ORDER — CHLORHEXIDINE GLUCONATE 0.12 % MT SOLN
15.0000 mL | Freq: Once | OROMUCOSAL | Status: AC
Start: 2015-03-30 — End: 2015-03-30
  Administered 2015-03-30: 15 mL via OROMUCOSAL
  Filled 2015-03-29: qty 15

## 2015-03-29 MED ORDER — POTASSIUM CHLORIDE 2 MEQ/ML IV SOLN
80.0000 meq | INTRAVENOUS | Status: DC
  Filled 2015-03-29: qty 40

## 2015-03-29 MED ORDER — DEXTROSE 5 % IV SOLN
1.5000 g | INTRAVENOUS | Status: AC
  Administered 2015-03-30: 1.5 g via INTRAVENOUS
  Administered 2015-03-30: .75 g via INTRAVENOUS
  Filled 2015-03-29 (×2): qty 1.5

## 2015-03-29 MED ORDER — PLASMA-LYTE 148 IV SOLN
INTRAVENOUS | Status: DC
  Filled 2015-03-29: qty 2.5

## 2015-03-29 MED ORDER — SODIUM CHLORIDE 0.9 % IV SOLN
INTRAVENOUS | Status: DC
  Filled 2015-03-29: qty 2.5

## 2015-03-29 MED ORDER — SODIUM CHLORIDE 0.9 % IV SOLN
INTRAVENOUS | Status: DC
  Administered 2015-03-30: 70 mL/h via INTRAVENOUS
  Filled 2015-03-29: qty 40

## 2015-03-29 MED ORDER — DEXMEDETOMIDINE HCL IN NACL 400 MCG/100ML IV SOLN
0.1000 ug/kg/h | INTRAVENOUS | Status: DC
Start: 2015-03-30 — End: 2015-03-29
  Filled 2015-03-29: qty 100

## 2015-03-29 MED ORDER — VANCOMYCIN HCL 10 G IV SOLR
1500.0000 mg | INTRAVENOUS | Status: AC
  Administered 2015-03-30: 1500 mg via INTRAVENOUS
  Filled 2015-03-29: qty 1500

## 2015-03-29 MED ORDER — PHENYLEPHRINE HCL 10 MG/ML IJ SOLN
30.0000 ug/min | INTRAVENOUS | Status: DC
  Filled 2015-03-29: qty 2

## 2015-03-29 MED ORDER — DEXTROSE 5 % IV SOLN
750.0000 mg | INTRAVENOUS | Status: DC
  Filled 2015-03-29: qty 750

## 2015-03-29 MED ORDER — EPINEPHRINE HCL 1 MG/ML IJ SOLN
0.0000 ug/min | INTRAMUSCULAR | Status: DC
  Filled 2015-03-29: qty 4

## 2015-03-29 MED ORDER — BISACODYL 5 MG PO TBEC
5.0000 mg | DELAYED_RELEASE_TABLET | Freq: Once | ORAL | Status: AC
  Administered 2015-03-29: 5 mg via ORAL
  Filled 2015-03-29: qty 1

## 2015-03-29 MED ORDER — DOPAMINE-DEXTROSE 3.2-5 MG/ML-% IV SOLN
0.0000 ug/kg/min | INTRAVENOUS | Status: DC
  Filled 2015-03-29: qty 250

## 2015-03-29 NOTE — Progress Notes (Signed)
Patient ID: FRANCO DULEY, male   DOB: November 22, 1940, 74 y.o.   MRN: [id number]      [address]       [place] [address]             [phone]                 3 Days Post-Op Procedure(s) (LRB): Left Heart Cath and Coronary Angiography (N/A)  LOS: 3 days   Subjective: No chest pain at rest and on heparin  Objective: Vital signs in last 24 hours: Patient Vitals for the past 24 hrs:  BP Temp Temp src Pulse Resp SpO2 Weight  03/29/15 1033 (!) 157/62 mmHg - - - - - -  03/29/15 0540 (!) 161/73 mmHg 98.2 F (36.8 C) Oral (!) 54 - 99 % 232 lb 12.9 oz (105.6 kg)  03/28/15 2022 (!) 174/66 mmHg 98.8 F (37.1 C) Oral (!) 59 - 97 % -  03/28/15 1449 (!) 148/62 mmHg 98.3 F (36.8 C) Oral (!) 50 18 97 % -    Filed Weights   03/27/15 0500 03/28/15 0431 03/29/15 0540  Weight: 229 lb 9.6 oz (104.146 kg) 233 lb 11 oz (106 kg) 232 lb 12.9 oz (105.6 kg)    Hemodynamic parameters for last 24 hours:    Intake/Output from previous day: 10/22 0701 - 10/23 0700 In: 644.7 [P.O.:480; I.V.:164.7] Out: 1625 [Urine:1625] Intake/Output this shift: Total I/O In: 3 [I.V.:3] Out: -   Scheduled Meds: . allopurinol  100 mg Oral BID  . amLODipine  10 mg Oral Daily  . aspirin EC  81 mg Oral Daily  . atorvastatin  20 mg Oral q1800  . cholecalciferol  2,000 Units Oral Daily  . hydrALAZINE  25 mg Oral 3 times per day  . insulin aspart  0-15 Units Subcutaneous TID WC  . insulin aspart  0-5 Units Subcutaneous QHS  . isosorbide mononitrate  15 mg Oral Daily  . primidone  100 mg Oral Daily   And  . primidone  50 mg Oral QHS  . ranitidine  150 mg Oral BID  . sodium chloride  3 mL Intravenous Q12H   Continuous Infusions: . heparin 1,650 Units/hr (03/28/15 1322)   PRN Meds:.sodium chloride, acetaminophen, nitroGLYCERIN, ondansetron (ZOFRAN) IV, simethicone, sodium chloride  General appearance: alert and cooperative Neurologic: intact Heart: systolic murmur: holosystolic 3/6,  crescendo throughout the precordium Lungs: clear to auscultation bilaterally Abdomen: soft, non-tender; bowel sounds normal; no masses,  no organomegaly Extremities: extremities normal, atraumatic, no cyanosis or edema and Homans sign is negative, no sign of DVT  Lab Results: CBC: Recent Labs  03/28/15 0518 03/29/15 0224  WBC 7.5 6.0  HGB 15.2 14.5  HCT 45.2 42.3  PLT 170 176   BMET:  Recent Labs  03/28/15 0518 03/29/15 0224  NA 142 136  K 4.2 3.8  CL 103 103  CO2 27 25  GLUCOSE 102* 118*  BUN 12 11  CREATININE 1.02 0.99  CALCIUM 9.7 9.1    PT/INR:  Recent Labs  03/29/15 0224  LABPROT 14.9  INR 1.15     Radiology No results found.   Assessment/Plan: S/P Procedure(s) (LRB): Left Heart Cath and Coronary Angiography (N/A)  I have again discussed with the patient the risk and options involved with treatment of cad and moderate as. ABG +/_ aortic valve replacement is best option and Dr Lonia Blood agrees. Patient does not wish to take coumadin.  Will change  preop betablocker because of  the metabolism problems with lopressor will use atenolol    The goals risks and alternatives of the planned surgical procedure CABG and Aortic Valve Replacement   have been discussed with the patient in detail. The risks of the procedure including death, infection, stroke, myocardial infarction, bleeding, blood transfusion have all been discussed specifically.  I have quoted [name] a CABG 3 % of perioperative mortality and a complication rate as high as 10 %. The patient's questions have been answered.[name] is willing  to proceed with the planned procedure.   Grace Isaac MD 03/29/2015 1:24 PM

## 2015-03-29 NOTE — Progress Notes (Signed)
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  Ambulated some yesterday w/o difficulty.  Very anxious about pending surgery.  Having second thoughts and would like to better understand alternatives.   CURRENT MEDS . allopurinol  100 mg Oral BID  . amLODipine  10 mg Oral Daily  . aspirin EC  81 mg Oral Daily  . atorvastatin  20 mg Oral q1800  . cholecalciferol  2,000 Units Oral Daily  . hydrALAZINE  25 mg Oral 3 times per day  . insulin aspart  0-15 Units Subcutaneous TID WC  . insulin aspart  0-5 Units Subcutaneous QHS  . isosorbide mononitrate  15 mg Oral Daily  . primidone  100 mg Oral Daily   And  . primidone  50 mg Oral QHS  . ranitidine  150 mg Oral BID  . sodium chloride  3 mL Intravenous Q12H    OBJECTIVE  Filed Vitals:   03/28/15 0854 03/28/15 1449 03/28/15 2022 03/29/15 0540  BP: 138/55 148/62 174/66 161/73  Pulse:  50 59 54  Temp:  98.3 F (36.8 C) 98.8 F (37.1 C) 98.2 F (36.8 C)  TempSrc:  Oral Oral Oral  Resp:  18    Height:      Weight:    232 lb 12.9 oz (105.6 kg)  SpO2:  97% 97% 99%    Intake/Output Summary (Last 24 hours) at 03/29/15 0820 Last data filed at 03/29/15 0500  Gross per 24 hour  Intake 644.73 ml  Output   1625 ml  Net -980.27 ml   Filed Weights   03/27/15 0500 03/28/15 0431 03/29/15 0540  Weight: 229 lb 9.6 oz (104.146 kg) 233 lb 11 oz (106 kg) 232 lb 12.9 oz (105.6 kg)    PHYSICAL EXAM  General: Pleasant, NAD. Neuro: Alert and oriented X 3. Moves all extremities spontaneously. Psych: Normal affect. HEENT:   Normal  Neck: Supple without bruits or JVD. Lungs:  Resp regular and unlabored, CTA. Heart: RRR no s3, s4, 3/6 SEM throughout, loudest @ RUSB. Abdomen: Soft, non-tender, non-distended, BS + x 4.  Extremities: No clubbing, cyanosis or edema. DP/PT/Radials 2+ and equal bilaterally.  Bilat fem bruits.  No r groin hematoma/bleeding.  Accessory Clinical Findings  CBC  Recent Labs  03/28/15 0518 03/29/15 0224  WBC 7.5 6.0  NEUTROABS 3.7 2.9  HGB 15.2 14.5  HCT 45.2 42.3  MCV 91.1 89.8  PLT 170 458   Basic Metabolic Panel  Recent Labs  03/28/15 0518 03/29/15 0224  NA 142 136  K 4.2 3.8  CL 103 103  CO2 27 25  GLUCOSE 102* 118*  BUN 12 11  CREATININE 1.02 0.99  CALCIUM 9.7 9.1   Liver Function Tests  Recent Labs  03/29/15 0224  AST 23  ALT 30  ALKPHOS 42  BILITOT 1.0  PROT 6.2*  ALBUMIN 3.7   Cardiac Enzymes  Recent Labs  03/26/15 1030  TROPONINI 1.85*   TELE  rsr  Cardiac Catheterization 03/26/2015    2D Echocardiogram 03/26/2015  Study Conclusions  - Left ventricle: The  cavity size was mildly dilated. Wall thickness was increased in a pattern of moderate LVH. Systolic function was normal. The estimated ejection fraction was in the range of 60% to 65%. Doppler parameters are consistent with elevated ventricular end-diastolic filling pressure. - Aortic valve: There was moderate stenosis. Valve area (VTI): 1.44 cm^2. Valve area (Vmax): 1.31 cm^2. Valve area (Vmean): 1.37 cm^2. - Mitral valve: Calcified annulus. There was mild regurgitation. - Left atrium: The atrium was mildly dilated. - Right atrium: The atrium was mildly dilated. - Atrial septum: No defect or patent foramen ovale was identified. _____________  ASSESSMENT AND PLAN  1. NSTEMI/ACS/CAD: s/p cath on 10/20 revealing severe multi-vessel CAD. Seen by CT surgery with plan for CABG and possible AVR on 10/24. Seen by anesthesia.  Pt having a lot of doubts about  undergoing CABG related to the surgery itself, possible complications related to anesthesia, prolonged recovery, and perhaps more than anything, his h/o depression/PTSD and lack of social support.  We talked for roughly 30 minutes regarding the mortality benefit associated with CABG and the lack of a mortality benefit with stenting.  He verbalized understanding but remains anxious.  I will discuss his case with Dr. [name].  No further chest pain or dyspnea. Cont ASA/statin. No bb 2/2 baseline bradycardia. Consider adding plavix POST-CABG in setting of ACS.  2. Essential HTN: BP trended better throughout the day yesterday but was higher last night and this AM.  Cont hydralazine. In the long run, would benefit from acei with ACS and borderline DM.  Will have to readdress regimen post-CABG.  3. HL: LDL 119. Switched to lipitor 20 2/2 cytochrome p450 CYP2D6 variant. LFT's wnl.  4. Diet controlled DM: HbA1c = 6.0. Cont SSI.  5. Mod Ao Stenosis: Peak gradient of 20 mmHg by cath.   6. GERD: Cont zantac.  7. Bilateral Femoral Bruits: Denies any h/o claudication. Strong distal pulses. Outpt ABI's following recovery from surgery. Cont ASA/Statin.  8. Elevated TSH: TSH 7.951,  Free T4 nl @ 0.78.  Probably sick euthyroid.  9.  Depression/anxiety:  Pt reports a h/o severe depression and PTSD in the past.  He has concerns that having CABG may send him into a deep depression.  He does not feel that he has much social support.  He has been on antidepressants in the past but didn't like the way they made him feel.  Difficult situation.  If he goes forward with surgery, we will likely need to have medicine see him post-op in order to assist with this.  Signed, Troy Hodgkins NP Patient seen and examined and history reviewed. Agree with above findings and plan. Patient nervous about surgery. No chest pain or SOB. VSS. I personally reviewed cath data and Echo. He did have a cath gradient  across the AV of about 15 mm Hg. By Echo the valve is heavily calcified and eccentric with restriction. I would favor AVR at the time of surgery.  Arvon Schreiner [name], [place] 03/29/2015 12:29 PM

## 2015-03-29 NOTE — Progress Notes (Addendum)
ANTICOAGULATION CONSULT NOTE - Follow Up Consult  Pharmacy Consult:  Heparin Indication: chest pain/ACS  Allergies  Allergen Reactions  . Benzodiazepines     Patient states he gets stroke symptoms with benzo's.   . Coreg [Carvedilol]     Product contains c-450. Patient allergic to C-450 (stroke like symptoms)  . Diazepam Other (See Comments)    Stroke sx  . Lisinopril Other (See Comments)    Pt states C450, and hyperkalemia  . Mobic [Meloxicam]     Patient states he gets stroke symptoms with benzo's.   . Sertraline Hcl     Pt states all meds that are broken down with C-450- if so have sxs of stroke  . Tramadol Other (See Comments)    Unable to take 450    Patient Measurements: Height: 5\' 8"  (172.7 cm) Weight: 232 lb 12.9 oz (105.6 kg) IBW/kg (Calculated) : 68.4 Heparin Dosing Weight: 92 kg  Vital Signs: Temp: 98.2 F (36.8 C) (10/23 0540) Temp Source: Oral (10/23 0540) BP: 157/62 mmHg (10/23 1033) Pulse Rate: 54 (10/23 0540)  Labs:  Recent Labs  03/27/15 0339  03/27/15 2035 03/28/15 0518 03/29/15 0224  HGB 15.1  --   --  15.2 14.5  HCT 45.4  --   --  45.2 42.3  PLT 179  --   --  170 176  LABPROT  --   --   --   --  14.9  INR  --   --   --   --  1.15  HEPARINUNFRC  --   < > 0.34 0.40 0.46  CREATININE 1.09  --   --  1.02 0.99  < > = values in this interval not displayed.  Estimated Creatinine Clearance: 79.5 mL/min (by C-G formula based on Cr of 0.99).    Assessment: 68 YOM to continue on IV heparin for ACS.  Patient is s/p cath on 03/26/15 and to proceed with CABG +/- AVR in AM.  Heparin level is therapeutic; no bleeding reported.   Other: poor metabolizer of 2D6 (expressed in liver, but also CNS).  Had stroke-like reaction to Valium in 2010 (2C19, 3A4) that lead to testing for P450 deficiency. **unsure of clinical significant, recommend using the lowest dose if meds are hepatically cleared  New Meds Lipitor (3A4) - low dose - monitor for muscle  aches/pain, rhabdo Imdur (hepatic: denitration, glucuronidation, hydration) - tolerated  Potential New Med Plavix (2C19 >> 1A2, 2B6, 3A) Effient (hydrolysis in intestine then 3A, 2B6) Brilinta (3A4) - weak inhibitor of 2D6 in vitro, has 60mg  tablet available  **regardless of which agent is used, recommend platelet activity monitoring weekly initially Lisinopril (renal, so no issue)  Meds for CABG +/- AVR Precedex (primarily 2A6, some 2D6) - Dr. Ola Spurr agrees to avoid and may use Propofol if needed Insulin (hepatic, >50%) Epinephrine (MAO, COMT) Dopamine (MAO, COMT, hydroxylation) NTG (hepatic: reductase, reduction, hydrolysis) Phenylephrine (MAO, sulfotransferase) Papaverine (hepatic) KCL, Mag, Amicar, Vanc, Zinacef - no issue Fentanyl (3A4) Versed (3A4) Sufenta (hepatic and small intestine via demethylation and dealkylation)  Meds post CABG APAP - extensive liver metabolism Bisacodyl, docusate - no issue Pepcid - on Zantac PTA Zofran PRN - hepatic Tramadol PRN - avoid, as well as hydrocodone and oxycodone.  Alternative >> morphine   2D6 and drug dosing suggestions:   https://www.http://www.rice.biz/ http://www.mayomedicallaboratories.com/it-mmfiles/Cytochrome_P450_2D6_Known_Drug_Interaction_Chart.pdf   Goal of Therapy:  Heparin level 0.3-0.7 units/ml Monitor platelets by anticoagulation protocol: Yes    Plan:  - Continue heparin gtt at 1650 units/hr -  Daily HL / CBC - F/U post CABG in AM - Would be cautious with anti-depressant selection because many are metabolized by 2D6   Katherin Ramey D. Mina Marble, PharmD, BCPS Pager:  [phone] 03/29/2015, 2:02 PM

## 2015-03-30 ENCOUNTER — Inpatient Hospital Stay (HOSPITAL_COMMUNITY): Payer: Medicare Other

## 2015-03-30 ENCOUNTER — Encounter (HOSPITAL_COMMUNITY): Payer: Self-pay | Admitting: Anesthesiology

## 2015-03-30 ENCOUNTER — Encounter (HOSPITAL_COMMUNITY): Payer: Medicare Other

## 2015-03-30 ENCOUNTER — Inpatient Hospital Stay (HOSPITAL_COMMUNITY): Payer: Medicare Other | Admitting: Anesthesiology

## 2015-03-30 ENCOUNTER — Encounter (HOSPITAL_COMMUNITY)
Admission: EM | Disposition: A | Discharge status: Home / Self Care | Payer: Medicare Other | Source: Home / Self Care | Attending: Cardiothoracic Surgery

## 2015-03-30 DIAGNOSIS — T884XXA Failed or difficult intubation, initial encounter: Secondary | ICD-10-CM

## 2015-03-30 DIAGNOSIS — Z951 Presence of aortocoronary bypass graft: Secondary | ICD-10-CM | POA: Diagnosis present

## 2015-03-30 HISTORY — DX: Failed or difficult intubation, initial encounter: T88.4XXA

## 2015-03-30 HISTORY — PX: TEE WITHOUT CARDIOVERSION: SHX5443

## 2015-03-30 HISTORY — PX: AORTIC VALVE REPLACEMENT: SHX41

## 2015-03-30 HISTORY — PX: CORONARY ARTERY BYPASS GRAFT: SHX141

## 2015-03-30 LAB — CBC
HCT: 35.4 % — ABNORMAL LOW (ref 39.0–52.0)
HEMATOCRIT: 34.8 % — AB (ref 39.0–52.0)
Hemoglobin: 11.9 g/dL — ABNORMAL LOW (ref 13.0–17.0)
Hemoglobin: 11.7 g/dL — ABNORMAL LOW (ref 13.0–17.0)
MCH: 31.1 pg (ref 26.0–34.0)
MCH: 30.2 pg (ref 26.0–34.0)
MCHC: 34.2 g/dL (ref 30.0–36.0)
MCHC: 33.1 g/dL (ref 30.0–36.0)
MCV: 90.9 fL (ref 78.0–100.0)
MCV: 91.2 fL (ref 78.0–100.0)
PLATELETS: 105 10*3/uL — AB (ref 150–400)
Platelets: 110 10*3/uL — ABNORMAL LOW (ref 150–400)
RBC: 3.83 MIL/uL — ABNORMAL LOW (ref 4.22–5.81)
RBC: 3.88 MIL/uL — ABNORMAL LOW (ref 4.22–5.81)
RDW: 13.7 % (ref 11.5–15.5)
RDW: 13.8 % (ref 11.5–15.5)
WBC: 10.8 10*3/uL — AB (ref 4.0–10.5)
WBC: 9 10*3/uL (ref 4.0–10.5)

## 2015-03-30 LAB — POCT I-STAT, CHEM 8
BUN: 11 mg/dL (ref 6–20)
BUN: 12 mg/dL (ref 6–20)
BUN: 12 mg/dL (ref 6–20)
BUN: 12 mg/dL (ref 6–20)
BUN: 11 mg/dL (ref 6–20)
BUN: 12 mg/dL (ref 6–20)
BUN: 12 mg/dL (ref 6–20)
BUN: 12 mg/dL (ref 6–20)
BUN: 11 mg/dL (ref 6–20)
CALCIUM ION: 1.25 mmol/L (ref 1.13–1.30)
CALCIUM ION: 1.24 mmol/L (ref 1.13–1.30)
CALCIUM ION: 1.09 mmol/L — AB (ref 1.13–1.30)
CALCIUM ION: 1.14 mmol/L (ref 1.13–1.30)
CALCIUM ION: 1.3 mmol/L (ref 1.13–1.30)
CHLORIDE: 102 mmol/L (ref 101–111)
CHLORIDE: 97 mmol/L — AB (ref 101–111)
CHLORIDE: 100 mmol/L — AB (ref 101–111)
CHLORIDE: 102 mmol/L (ref 101–111)
CHLORIDE: 102 mmol/L (ref 101–111)
CHLORIDE: 103 mmol/L (ref 101–111)
CHLORIDE: 104 mmol/L (ref 101–111)
CHLORIDE: 104 mmol/L (ref 101–111)
CREATININE: 1 mg/dL (ref 0.61–1.24)
CREATININE: 0.9 mg/dL (ref 0.61–1.24)
CREATININE: 0.9 mg/dL (ref 0.61–1.24)
CREATININE: 1 mg/dL (ref 0.61–1.24)
Calcium, Ion: 1.15 mmol/L (ref 1.13–1.30)
Calcium, Ion: 1.15 mmol/L (ref 1.13–1.30)
Calcium, Ion: 1.15 mmol/L (ref 1.13–1.30)
Calcium, Ion: 1.25 mmol/L (ref 1.13–1.30)
Chloride: 103 mmol/L (ref 101–111)
Creatinine, Ser: 0.9 mg/dL (ref 0.61–1.24)
Creatinine, Ser: 0.9 mg/dL (ref 0.61–1.24)
Creatinine, Ser: 1 mg/dL (ref 0.61–1.24)
Creatinine, Ser: 0.9 mg/dL (ref 0.61–1.24)
Creatinine, Ser: 1 mg/dL (ref 0.61–1.24)
GLUCOSE: 123 mg/dL — AB (ref 65–99)
GLUCOSE: 126 mg/dL — AB (ref 65–99)
GLUCOSE: 147 mg/dL — AB (ref 65–99)
Glucose, Bld: 116 mg/dL — ABNORMAL HIGH (ref 65–99)
Glucose, Bld: 107 mg/dL — ABNORMAL HIGH (ref 65–99)
Glucose, Bld: 162 mg/dL — ABNORMAL HIGH (ref 65–99)
Glucose, Bld: 162 mg/dL — ABNORMAL HIGH (ref 65–99)
Glucose, Bld: 145 mg/dL — ABNORMAL HIGH (ref 65–99)
Glucose, Bld: 158 mg/dL — ABNORMAL HIGH (ref 65–99)
HCT: 44 % (ref 39.0–52.0)
HCT: 39 % (ref 39.0–52.0)
HCT: 33 % — ABNORMAL LOW (ref 39.0–52.0)
HEMATOCRIT: 33 % — AB (ref 39.0–52.0)
HEMATOCRIT: 32 % — AB (ref 39.0–52.0)
HEMATOCRIT: 33 % — AB (ref 39.0–52.0)
HEMATOCRIT: 33 % — AB (ref 39.0–52.0)
HEMATOCRIT: 34 % — AB (ref 39.0–52.0)
HEMATOCRIT: 34 % — AB (ref 39.0–52.0)
HEMOGLOBIN: 11.2 g/dL — AB (ref 13.0–17.0)
HEMOGLOBIN: 11.6 g/dL — AB (ref 13.0–17.0)
Hemoglobin: 15 g/dL (ref 13.0–17.0)
Hemoglobin: 13.3 g/dL (ref 13.0–17.0)
Hemoglobin: 11.2 g/dL — ABNORMAL LOW (ref 13.0–17.0)
Hemoglobin: 11.2 g/dL — ABNORMAL LOW (ref 13.0–17.0)
Hemoglobin: 10.9 g/dL — ABNORMAL LOW (ref 13.0–17.0)
Hemoglobin: 11.2 g/dL — ABNORMAL LOW (ref 13.0–17.0)
Hemoglobin: 11.6 g/dL — ABNORMAL LOW (ref 13.0–17.0)
POTASSIUM: 3.9 mmol/L (ref 3.5–5.1)
POTASSIUM: 4.4 mmol/L (ref 3.5–5.1)
POTASSIUM: 4.1 mmol/L (ref 3.5–5.1)
POTASSIUM: 4.7 mmol/L (ref 3.5–5.1)
POTASSIUM: 4.5 mmol/L (ref 3.5–5.1)
Potassium: 4.2 mmol/L (ref 3.5–5.1)
Potassium: 4.2 mmol/L (ref 3.5–5.1)
Potassium: 4.5 mmol/L (ref 3.5–5.1)
Potassium: 4.1 mmol/L (ref 3.5–5.1)
SODIUM: 136 mmol/L (ref 135–145)
SODIUM: 137 mmol/L (ref 135–145)
SODIUM: 137 mmol/L (ref 135–145)
SODIUM: 138 mmol/L (ref 135–145)
SODIUM: 139 mmol/L (ref 135–145)
SODIUM: 138 mmol/L (ref 135–145)
Sodium: 139 mmol/L (ref 135–145)
Sodium: 138 mmol/L (ref 135–145)
Sodium: 137 mmol/L (ref 135–145)
TCO2: 27 mmol/L (ref 0–100)
TCO2: 24 mmol/L (ref 0–100)
TCO2: 26 mmol/L (ref 0–100)
TCO2: 25 mmol/L (ref 0–100)
TCO2: 24 mmol/L (ref 0–100)
TCO2: 24 mmol/L (ref 0–100)
TCO2: 23 mmol/L (ref 0–100)
TCO2: 24 mmol/L (ref 0–100)
TCO2: 24 mmol/L (ref 0–100)

## 2015-03-30 LAB — MAGNESIUM: Magnesium: 2.4 mg/dL (ref 1.7–2.4)

## 2015-03-30 LAB — POCT I-STAT 3, ART BLOOD GAS (G3+)
ACID-BASE DEFICIT: 1 mmol/L (ref 0.0–2.0)
Acid-Base Excess: 2 mmol/L (ref 0.0–2.0)
Acid-base deficit: 3 mmol/L — ABNORMAL HIGH (ref 0.0–2.0)
Bicarbonate: 27.1 mEq/L — ABNORMAL HIGH (ref 20.0–24.0)
Bicarbonate: 24.6 mEq/L — ABNORMAL HIGH (ref 20.0–24.0)
Bicarbonate: 22.3 mEq/L (ref 20.0–24.0)
O2 SAT: 100 %
O2 SAT: 100 %
O2 Saturation: 92 %
PCO2 ART: 45.5 mmHg — AB (ref 35.0–45.0)
PCO2 ART: 41.4 mmHg (ref 35.0–45.0)
PCO2 ART: 39.3 mmHg (ref 35.0–45.0)
PH ART: 7.382 (ref 7.350–7.450)
PO2 ART: 310 mmHg — AB (ref 80.0–100.0)
PO2 ART: 297 mmHg — AB (ref 80.0–100.0)
PO2 ART: 63 mmHg — AB (ref 80.0–100.0)
Patient temperature: 36.3
TCO2: 28 mmol/L (ref 0–100)
TCO2: 26 mmol/L (ref 0–100)
TCO2: 24 mmol/L (ref 0–100)
pH, Arterial: 7.382 (ref 7.350–7.450)
pH, Arterial: 7.358 (ref 7.350–7.450)

## 2015-03-30 LAB — BASIC METABOLIC PANEL
Anion gap: 11 (ref 5–15)
BUN: 10 mg/dL (ref 6–20)
CO2: 23 mmol/L (ref 22–32)
Calcium: 9.4 mg/dL (ref 8.9–10.3)
Chloride: 105 mmol/L (ref 101–111)
Creatinine, Ser: 0.96 mg/dL (ref 0.61–1.24)
GFR calc Af Amer: >60 mL/min (ref >60)
GFR calc non Af Amer: >60 mL/min (ref >60)
Glucose, Bld: 103 mg/dL — ABNORMAL HIGH (ref 65–99)
Potassium: 3.8 mmol/L (ref 3.5–5.1)
Sodium: 139 mmol/L (ref 135–145)

## 2015-03-30 LAB — POCT I-STAT 4, (NA,K, GLUC, HGB,HCT)
Glucose, Bld: 101 mg/dL — ABNORMAL HIGH (ref 65–99)
HEMATOCRIT: 35 % — AB (ref 39.0–52.0)
Hemoglobin: 11.9 g/dL — ABNORMAL LOW (ref 13.0–17.0)
Potassium: 4 mmol/L (ref 3.5–5.1)
Sodium: 141 mmol/L (ref 135–145)

## 2015-03-30 LAB — CBC WITH DIFFERENTIAL/PLATELET
BASOS ABS: 0.1 10*3/uL (ref 0.0–0.1)
Basophils Relative: 1 %
EOS ABS: 0.2 10*3/uL (ref 0.0–0.7)
Eosinophils Relative: 2 %
HCT: 42.2 % (ref 39.0–52.0)
HEMOGLOBIN: 14.4 g/dL (ref 13.0–17.0)
LYMPHS ABS: 2.1 10*3/uL (ref 0.7–4.0)
LYMPHS PCT: 33 %
MCH: 30.7 pg (ref 26.0–34.0)
MCHC: 34.1 g/dL (ref 30.0–36.0)
MCV: 90 fL (ref 78.0–100.0)
Monocytes Absolute: 0.8 10*3/uL (ref 0.1–1.0)
Monocytes Relative: 13 %
NEUTROS PCT: 51 %
Neutro Abs: 3.2 10*3/uL (ref 1.7–7.7)
PLATELETS: 166 10*3/uL (ref 150–400)
RBC: 4.69 MIL/uL (ref 4.22–5.81)
RDW: 13.7 % (ref 11.5–15.5)
WBC: 6.4 10*3/uL (ref 4.0–10.5)

## 2015-03-30 LAB — HEMOGLOBIN AND HEMATOCRIT, BLOOD
HCT: 32.5 % — ABNORMAL LOW (ref 39.0–52.0)
Hemoglobin: 11.3 g/dL — ABNORMAL LOW (ref 13.0–17.0)

## 2015-03-30 LAB — PROTIME-INR
INR: 1.47 (ref 0.00–1.49)
Prothrombin Time: 17.9 seconds — ABNORMAL HIGH (ref 11.6–15.2)

## 2015-03-30 LAB — GLUCOSE, CAPILLARY
GLUCOSE-CAPILLARY: 89 mg/dL (ref 65–99)
GLUCOSE-CAPILLARY: 132 mg/dL — AB (ref 65–99)
Glucose-Capillary: 84 mg/dL (ref 65–99)
Glucose-Capillary: 105 mg/dL — ABNORMAL HIGH (ref 65–99)
Glucose-Capillary: 130 mg/dL — ABNORMAL HIGH (ref 65–99)

## 2015-03-30 LAB — APTT: aPTT: 36 seconds (ref 24–37)

## 2015-03-30 LAB — CREATININE, SERUM
Creatinine, Ser: 1.02 mg/dL (ref 0.61–1.24)
GFR calc Af Amer: >60 mL/min (ref >60)
GFR calc non Af Amer: >60 mL/min (ref >60)

## 2015-03-30 LAB — HEPARIN LEVEL (UNFRACTIONATED): Heparin Unfractionated: 0.49 IU/mL (ref 0.30–0.70)

## 2015-03-30 LAB — MRSA PCR SCREENING: MRSA by PCR: NEGATIVE

## 2015-03-30 LAB — PLATELET COUNT: Platelets: 127 10*3/uL — ABNORMAL LOW (ref 150–400)

## 2015-03-30 LAB — PREPARE RBC (CROSSMATCH)

## 2015-03-30 SURGERY — CORONARY ARTERY BYPASS GRAFTING (CABG)
Anesthesia: General | Site: Chest

## 2015-03-30 MED ORDER — PLASMA-LYTE 148 IV SOLN
INTRAVENOUS | Status: DC | PRN
  Administered 2015-03-30: 500 mL via INTRAVASCULAR

## 2015-03-30 MED ORDER — CALCIUM CHLORIDE 10 % IV SOLN
INTRAVENOUS | Status: DC | PRN
  Administered 2015-03-30 (×2): 250 mg via INTRAVENOUS

## 2015-03-30 MED ORDER — LACTATED RINGERS IV SOLN: INTRAVENOUS | Status: DC

## 2015-03-30 MED ORDER — SODIUM CHLORIDE 0.9 % IV SOLN
INTRAVENOUS | Status: DC
  Administered 2015-03-30: 2.1 [IU]/h via INTRAVENOUS
  Filled 2015-03-30: qty 2.5

## 2015-03-30 MED ORDER — ANTISEPTIC ORAL RINSE SOLUTION (CORINZ)
7.0000 mL | Freq: Four times a day (QID) | OROMUCOSAL | Status: DC
  Administered 2015-03-30 – 2015-03-31 (×4): 7 mL via OROMUCOSAL

## 2015-03-30 MED ORDER — SODIUM CHLORIDE 0.9 % IV SOLN
250.0000 [IU] | INTRAVENOUS | Status: DC | PRN
  Administered 2015-03-30: 1 [IU]/h via INTRAVENOUS

## 2015-03-30 MED ORDER — FENTANYL CITRATE (PF) 250 MCG/5ML IJ SOLN
INTRAMUSCULAR | Status: AC
  Filled 2015-03-30: qty 5

## 2015-03-30 MED ORDER — SODIUM CHLORIDE 0.9 % IV SOLN: Freq: Once | INTRAVENOUS | Status: DC

## 2015-03-30 MED ORDER — ACETAMINOPHEN 500 MG PO TABS
1000.0000 mg | ORAL_TABLET | Freq: Four times a day (QID) | ORAL | Status: AC
  Administered 2015-03-31 – 2015-04-04 (×19): 1000 mg via ORAL
  Filled 2015-03-30 (×20): qty 2

## 2015-03-30 MED ORDER — PROPOFOL 10 MG/ML IV BOLUS
INTRAVENOUS | Status: DC | PRN
  Administered 2015-03-30: 80 mg via INTRAVENOUS

## 2015-03-30 MED ORDER — PHENYLEPHRINE HCL 10 MG/ML IJ SOLN
10.0000 mg | INTRAVENOUS | Status: DC | PRN
  Administered 2015-03-30: 25 ug/min via INTRAVENOUS

## 2015-03-30 MED ORDER — FAMOTIDINE IN NACL 20-0.9 MG/50ML-% IV SOLN
20.0000 mg | Freq: Two times a day (BID) | INTRAVENOUS | Status: DC
  Administered 2015-03-30: 20 mg via INTRAVENOUS
  Filled 2015-03-30: qty 50

## 2015-03-30 MED ORDER — HEMOSTATIC AGENTS (NO CHARGE) OPTIME
TOPICAL | Status: DC | PRN
  Administered 2015-03-30: 1 via TOPICAL

## 2015-03-30 MED ORDER — HEPARIN SODIUM (PORCINE) 1000 UNIT/ML IJ SOLN
INTRAMUSCULAR | Status: DC | PRN
  Administered 2015-03-30: 45000 [IU] via INTRAVENOUS

## 2015-03-30 MED ORDER — SODIUM CHLORIDE 0.9 % IV SOLN
INTRAVENOUS | Status: DC
  Administered 2015-03-30: 1 mL via INTRAVENOUS

## 2015-03-30 MED ORDER — HEMOSTATIC AGENTS (NO CHARGE) OPTIME
TOPICAL | Status: DC | PRN
  Administered 2015-03-30 (×3): 1 via TOPICAL

## 2015-03-30 MED ORDER — PHENYLEPHRINE 40 MCG/ML (10ML) SYRINGE FOR IV PUSH (FOR BLOOD PRESSURE SUPPORT)
PREFILLED_SYRINGE | INTRAVENOUS | Status: AC
  Filled 2015-03-30: qty 10

## 2015-03-30 MED ORDER — POTASSIUM CHLORIDE 10 MEQ/50ML IV SOLN: 10.0000 meq | INTRAVENOUS | Status: AC

## 2015-03-30 MED ORDER — ONDANSETRON HCL 4 MG/2ML IJ SOLN
INTRAMUSCULAR | Status: AC
  Filled 2015-03-30: qty 2

## 2015-03-30 MED ORDER — LIDOCAINE HCL (CARDIAC) 20 MG/ML IV SOLN
INTRAVENOUS | Status: DC | PRN
  Administered 2015-03-30: 100 mg via INTRAVENOUS

## 2015-03-30 MED ORDER — VANCOMYCIN HCL IN DEXTROSE 1-5 GM/200ML-% IV SOLN
1000.0000 mg | Freq: Once | INTRAVENOUS | Status: AC
  Administered 2015-03-30: 1000 mg via INTRAVENOUS
  Filled 2015-03-30: qty 200

## 2015-03-30 MED ORDER — SODIUM CHLORIDE 0.9 % IV SOLN: 250.0000 mL | INTRAVENOUS | Status: DC

## 2015-03-30 MED ORDER — PROPOFOL 10 MG/ML IV BOLUS
INTRAVENOUS | Status: AC
Start: 2015-03-30 — End: 2015-03-30
  Filled 2015-03-30: qty 20

## 2015-03-30 MED ORDER — DOCUSATE SODIUM 100 MG PO CAPS
200.0000 mg | ORAL_CAPSULE | Freq: Every day | ORAL | Status: DC
  Administered 2015-03-31 – 2015-04-06 (×4): 200 mg via ORAL
  Filled 2015-03-30 (×7): qty 2

## 2015-03-30 MED ORDER — LACTATED RINGERS IV SOLN
INTRAVENOUS | Status: DC
  Administered 2015-03-31: 10 mL/h via INTRAVENOUS

## 2015-03-30 MED ORDER — ACETAMINOPHEN 160 MG/5ML PO SOLN: 650.0000 mg | Freq: Once | ORAL | Status: AC

## 2015-03-30 MED ORDER — DEXMEDETOMIDINE HCL IN NACL 200 MCG/50ML IV SOLN: 0.0000 ug/kg/h | INTRAVENOUS | Status: DC

## 2015-03-30 MED ORDER — ONDANSETRON HCL 4 MG/2ML IJ SOLN
4.0000 mg | Freq: Four times a day (QID) | INTRAMUSCULAR | Status: DC | PRN
  Filled 2015-03-30: qty 2

## 2015-03-30 MED ORDER — ALBUMIN HUMAN 5 % IV SOLN
INTRAVENOUS | Status: DC | PRN
  Administered 2015-03-30 (×2): via INTRAVENOUS

## 2015-03-30 MED ORDER — MIDAZOLAM HCL 5 MG/5ML IJ SOLN
INTRAMUSCULAR | Status: DC | PRN
  Administered 2015-03-30: 3 mg via INTRAVENOUS
  Administered 2015-03-30: 2 mg via INTRAVENOUS

## 2015-03-30 MED ORDER — LACTATED RINGERS IV SOLN: 500.0000 mL | Freq: Once | INTRAVENOUS | Status: DC | PRN

## 2015-03-30 MED ORDER — ALBUMIN HUMAN 5 % IV SOLN
250.0000 mL | INTRAVENOUS | Status: AC | PRN
  Administered 2015-03-30: 250 mL via INTRAVENOUS

## 2015-03-30 MED ORDER — MAGNESIUM SULFATE 4 GM/100ML IV SOLN
4.0000 g | Freq: Once | INTRAVENOUS | Status: AC
  Administered 2015-03-30: 4 g via INTRAVENOUS
  Filled 2015-03-30: qty 100

## 2015-03-30 MED ORDER — LACTATED RINGERS IV SOLN
INTRAVENOUS | Status: DC | PRN
  Administered 2015-03-30: 07:00:00 via INTRAVENOUS

## 2015-03-30 MED ORDER — ACETAMINOPHEN 160 MG/5ML PO SOLN
1000.0000 mg | Freq: Four times a day (QID) | ORAL | Status: AC
Start: 2015-03-31 — End: 2015-04-04
  Administered 2015-03-30: 1000 mg
  Filled 2015-03-30: qty 40.6

## 2015-03-30 MED ORDER — MORPHINE SULFATE (PF) 2 MG/ML IV SOLN
2.0000 mg | INTRAVENOUS | Status: DC | PRN
Start: 2015-03-30 — End: 2015-04-06
  Administered 2015-03-31 (×7): 2 mg via INTRAVENOUS
  Filled 2015-03-30 (×6): qty 1

## 2015-03-30 MED ORDER — SODIUM CHLORIDE 0.9 % IJ SOLN: 3.0000 mL | INTRAMUSCULAR | Status: DC | PRN

## 2015-03-30 MED ORDER — SODIUM CHLORIDE 0.9 % IJ SOLN
INTRAMUSCULAR | Status: AC
  Filled 2015-03-30: qty 10

## 2015-03-30 MED ORDER — FENTANYL CITRATE (PF) 250 MCG/5ML IJ SOLN
INTRAMUSCULAR | Status: AC
Start: 2015-03-30 — End: 2015-03-30
  Filled 2015-03-30: qty 5

## 2015-03-30 MED ORDER — CHLORHEXIDINE GLUCONATE 0.12% ORAL RINSE (MEDLINE KIT)
15.0000 mL | Freq: Two times a day (BID) | OROMUCOSAL | Status: DC
  Administered 2015-03-30: 15 mL via OROMUCOSAL

## 2015-03-30 MED ORDER — MORPHINE SULFATE (PF) 2 MG/ML IV SOLN
1.0000 mg | INTRAVENOUS | Status: AC | PRN
  Administered 2015-03-30 – 2015-03-31 (×2): 2 mg via INTRAVENOUS
  Filled 2015-03-30 (×3): qty 1

## 2015-03-30 MED ORDER — PROTAMINE SULFATE 10 MG/ML IV SOLN
INTRAVENOUS | Status: DC | PRN
  Administered 2015-03-30: 350 mg via INTRAVENOUS

## 2015-03-30 MED ORDER — SODIUM CHLORIDE 0.45 % IV SOLN
INTRAVENOUS | Status: DC | PRN
  Administered 2015-03-30: 16:00:00 via INTRAVENOUS

## 2015-03-30 MED ORDER — ROCURONIUM BROMIDE 50 MG/5ML IV SOLN
INTRAVENOUS | Status: AC
  Filled 2015-03-30: qty 1

## 2015-03-30 MED ORDER — SUCCINYLCHOLINE CHLORIDE 20 MG/ML IJ SOLN
INTRAMUSCULAR | Status: DC | PRN
  Administered 2015-03-30: 100 mg via INTRAVENOUS

## 2015-03-30 MED ORDER — ACETAMINOPHEN 650 MG RE SUPP
650.0000 mg | Freq: Once | RECTAL | Status: AC
  Administered 2015-03-30: 650 mg via RECTAL

## 2015-03-30 MED ORDER — SODIUM CHLORIDE 0.9 % IJ SOLN
3.0000 mL | Freq: Two times a day (BID) | INTRAMUSCULAR | Status: DC
  Administered 2015-03-31 – 2015-04-01 (×3): 3 mL via INTRAVENOUS

## 2015-03-30 MED ORDER — NITROGLYCERIN IN D5W 200-5 MCG/ML-% IV SOLN: 0.0000 ug/min | INTRAVENOUS | Status: DC

## 2015-03-30 MED ORDER — FENTANYL CITRATE (PF) 100 MCG/2ML IJ SOLN
INTRAMUSCULAR | Status: DC | PRN
  Administered 2015-03-30: 250 ug via INTRAVENOUS
  Administered 2015-03-30: 500 ug via INTRAVENOUS
  Administered 2015-03-30: 250 ug via INTRAVENOUS
  Administered 2015-03-30: 100 ug via INTRAVENOUS
  Administered 2015-03-30 (×2): 250 ug via INTRAVENOUS
  Administered 2015-03-30: 50 ug via INTRAVENOUS
  Administered 2015-03-30: 100 ug via INTRAVENOUS

## 2015-03-30 MED ORDER — LIDOCAINE HCL (CARDIAC) 20 MG/ML IV SOLN
INTRAVENOUS | Status: AC
  Filled 2015-03-30: qty 5

## 2015-03-30 MED ORDER — INSULIN REGULAR BOLUS VIA INFUSION
0.0000 [IU] | Freq: Three times a day (TID) | INTRAVENOUS | Status: DC
  Filled 2015-03-30: qty 10

## 2015-03-30 MED ORDER — BISACODYL 10 MG RE SUPP: 10.0000 mg | Freq: Every day | RECTAL | Status: DC

## 2015-03-30 MED ORDER — PROPOFOL 1000 MG/100ML IV EMUL
5.0000 ug/kg/min | INTRAVENOUS | Status: DC
Start: 2015-03-30 — End: 2015-03-31
  Administered 2015-03-30: 35 ug/kg/min via INTRAVENOUS
  Administered 2015-03-30: 20 ug/kg/min via INTRAVENOUS
  Administered 2015-03-30: 50 ug/kg/min via INTRAVENOUS
  Filled 2015-03-30 (×2): qty 100

## 2015-03-30 MED ORDER — EPHEDRINE SULFATE 50 MG/ML IJ SOLN
INTRAMUSCULAR | Status: AC
  Filled 2015-03-30: qty 1

## 2015-03-30 MED ORDER — CEFUROXIME SODIUM 1.5 G IJ SOLR
1.5000 g | Freq: Two times a day (BID) | INTRAMUSCULAR | Status: AC
  Administered 2015-03-30 – 2015-04-01 (×4): 1.5 g via INTRAVENOUS
  Filled 2015-03-30 (×4): qty 1.5

## 2015-03-30 MED ORDER — ROCURONIUM BROMIDE 100 MG/10ML IV SOLN
INTRAVENOUS | Status: DC | PRN
  Administered 2015-03-30: 20 mg via INTRAVENOUS
  Administered 2015-03-30: 30 mg via INTRAVENOUS
  Administered 2015-03-30: 20 mg via INTRAVENOUS
  Administered 2015-03-30 (×2): 30 mg via INTRAVENOUS
  Administered 2015-03-30: 50 mg via INTRAVENOUS
  Administered 2015-03-30: 20 mg via INTRAVENOUS

## 2015-03-30 MED ORDER — 0.9 % SODIUM CHLORIDE (POUR BTL) OPTIME
TOPICAL | Status: DC | PRN
  Administered 2015-03-30: 6000 mL

## 2015-03-30 MED ORDER — MIDAZOLAM HCL 10 MG/2ML IJ SOLN
INTRAMUSCULAR | Status: AC
  Filled 2015-03-30: qty 4

## 2015-03-30 MED ORDER — PHENYLEPHRINE HCL 10 MG/ML IJ SOLN
0.0000 ug/min | INTRAVENOUS | Status: DC
  Administered 2015-03-30: 20 ug/min via INTRAVENOUS
  Filled 2015-03-30: qty 2

## 2015-03-30 MED ORDER — PROPOFOL 500 MG/50ML IV EMUL
INTRAVENOUS | Status: DC | PRN
  Administered 2015-03-30: 25 ug/kg/min via INTRAVENOUS

## 2015-03-30 MED ORDER — BISACODYL 5 MG PO TBEC
10.0000 mg | DELAYED_RELEASE_TABLET | Freq: Every day | ORAL | Status: DC
  Administered 2015-03-31 – 2015-04-06 (×5): 10 mg via ORAL
  Filled 2015-03-30 (×7): qty 2

## 2015-03-30 SURGICAL SUPPLY — 102 items
ADAPTER CARDIO PERF ANTE/RETRO (ADAPTER) ×1 IMPLANT
ADH SKN CLS APL DERMABOND .7 (GAUZE/BANDAGES/DRESSINGS) ×2
ADPR PRFSN 84XANTGRD RTRGD (ADAPTER) ×1
BAG DECANTER FOR FLEXI CONT (MISCELLANEOUS) ×2 IMPLANT
BANDAGE ELASTIC 4 VELCRO ST LF (GAUZE/BANDAGES/DRESSINGS) ×3 IMPLANT
BANDAGE ELASTIC 6 VELCRO ST LF (GAUZE/BANDAGES/DRESSINGS) ×3 IMPLANT
BLADE STERNUM SYSTEM 6 (BLADE) ×2 IMPLANT
BLADE SURG 11 STRL SS (BLADE) ×1 IMPLANT
BLADE SURG 15 STRL LF DISP TIS (BLADE) IMPLANT
BLADE SURG 15 STRL SS (BLADE) ×2
BNDG GAUZE ELAST 4 BULKY (GAUZE/BANDAGES/DRESSINGS) ×3 IMPLANT
CANISTER SUCTION 2500CC (MISCELLANEOUS) ×2 IMPLANT
CANNULA GUNDRY RCSP 15FR (MISCELLANEOUS) ×1 IMPLANT
CATH CPB KIT GERHARDT (MISCELLANEOUS) ×2 IMPLANT
CATH HEART VENT LEFT (CATHETERS) IMPLANT
CATH THORACIC 28FR (CATHETERS) ×2 IMPLANT
CLIP FOGARTY SPRING 6M (CLIP) ×1 IMPLANT
CONT SPEC 4OZ CLIKSEAL STRL BL (MISCELLANEOUS) ×1 IMPLANT
CRADLE DONUT ADULT HEAD (MISCELLANEOUS) ×2 IMPLANT
DERMABOND ADVANCED (GAUZE/BANDAGES/DRESSINGS) ×2
DERMABOND ADVANCED .7 DNX12 (GAUZE/BANDAGES/DRESSINGS) IMPLANT
DRAIN CHANNEL 28F RND 3/8 FF (WOUND CARE) ×2 IMPLANT
DRAPE CARDIOVASCULAR INCISE (DRAPES) ×2
DRAPE SLUSH/WARMER DISC (DRAPES) ×2 IMPLANT
DRAPE SRG 135X102X78XABS (DRAPES) ×1 IMPLANT
DRSG AQUACEL AG ADV 3.5X14 (GAUZE/BANDAGES/DRESSINGS) ×2 IMPLANT
ELECT BLADE 4.0 EZ CLEAN MEGAD (MISCELLANEOUS) ×4
ELECT REM PT RETURN 9FT ADLT (ELECTROSURGICAL) ×4
ELECTRODE BLDE 4.0 EZ CLN MEGD (MISCELLANEOUS) ×1 IMPLANT
ELECTRODE REM PT RTRN 9FT ADLT (ELECTROSURGICAL) ×2 IMPLANT
GAUZE SPONGE 4X4 12PLY STRL (GAUZE/BANDAGES/DRESSINGS) ×4 IMPLANT
GLOVE BIO SURGEON STRL SZ 6 (GLOVE) ×3 IMPLANT
GLOVE BIO SURGEON STRL SZ 6.5 (GLOVE) ×6 IMPLANT
GLOVE BIO SURGEON STRL SZ7 (GLOVE) ×4 IMPLANT
GLOVE BIOGEL PI IND STRL 7.0 (GLOVE) IMPLANT
GLOVE BIOGEL PI INDICATOR 7.0 (GLOVE) ×1
GOWN STRL REUS W/ TWL LRG LVL3 (GOWN DISPOSABLE) ×4 IMPLANT
GOWN STRL REUS W/TWL LRG LVL3 (GOWN DISPOSABLE) ×8
HEMOSTAT POWDER SURGIFOAM 1G (HEMOSTASIS) ×6 IMPLANT
HEMOSTAT SURGICEL 2X14 (HEMOSTASIS) ×2 IMPLANT
KIT BASIN OR (CUSTOM PROCEDURE TRAY) ×2 IMPLANT
KIT CATH SUCT 8FR (CATHETERS) ×2 IMPLANT
KIT ROOM TURNOVER OR (KITS) ×2 IMPLANT
KIT SUCTION CATH 14FR (SUCTIONS) ×5 IMPLANT
KIT VASOVIEW W/TROCAR VH 2000 (KITS) ×2 IMPLANT
LEAD PACING MYOCARDI (MISCELLANEOUS) ×4 IMPLANT
LINE VENT (MISCELLANEOUS) ×1 IMPLANT
MARKER GRAFT CORONARY BYPASS (MISCELLANEOUS) ×6 IMPLANT
NS IRRIG 1000ML POUR BTL (IV SOLUTION) ×10 IMPLANT
PACK OPEN HEART (CUSTOM PROCEDURE TRAY) ×2 IMPLANT
PAD ARMBOARD 7.5X6 YLW CONV (MISCELLANEOUS) ×4 IMPLANT
PAD ELECT DEFIB RADIOL ZOLL (MISCELLANEOUS) ×2 IMPLANT
PENCIL BUTTON HOLSTER BLD 10FT (ELECTRODE) ×2 IMPLANT
PUNCH AORTIC ROT 4.0MM RCL 40 (MISCELLANEOUS) ×1 IMPLANT
SET CARDIOPLEGIA MPS 5001102 (MISCELLANEOUS) ×1 IMPLANT
SPONGE GAUZE 4X4 12PLY STER LF (GAUZE/BANDAGES/DRESSINGS) ×3 IMPLANT
SPONGE LAP 18X18 X RAY DECT (DISPOSABLE) ×3 IMPLANT
SUT BONE WAX W31G (SUTURE) ×2 IMPLANT
SUT ETHIBON 2 0 V 52N 30 (SUTURE) ×2 IMPLANT
SUT ETHIBOND 2 0 SH (SUTURE) ×8
SUT ETHIBOND 2 0 SH 36X2 (SUTURE) IMPLANT
SUT ETHIBOND NAB MH 2-0 36IN (SUTURE) ×1 IMPLANT
SUT MNCRL AB 4-0 PS2 18 (SUTURE) ×1 IMPLANT
SUT PROLENE 3 0 RB 1 (SUTURE) ×1 IMPLANT
SUT PROLENE 3 0 SH1 36 (SUTURE) ×3 IMPLANT
SUT PROLENE 4 0 RB 1 (SUTURE) ×10
SUT PROLENE 4 0 TF (SUTURE) ×4 IMPLANT
SUT PROLENE 4-0 RB1 .5 CRCL 36 (SUTURE) IMPLANT
SUT PROLENE 5 0 C 1 36 (SUTURE) ×2 IMPLANT
SUT PROLENE 6 0 C 1 30 (SUTURE) ×2 IMPLANT
SUT PROLENE 6 0 CC (SUTURE) ×8 IMPLANT
SUT PROLENE 7 0 BV1 MDA (SUTURE) ×3 IMPLANT
SUT PROLENE 8 0 BV175 6 (SUTURE) ×14 IMPLANT
SUT SILK  1 MH (SUTURE) ×3
SUT SILK 1 MH (SUTURE) IMPLANT
SUT SILK 1 TIES 10X30 (SUTURE) ×1 IMPLANT
SUT SILK 2 0 SH CR/8 (SUTURE) ×3 IMPLANT
SUT SILK 2 0 TIES 10X30 (SUTURE) ×1 IMPLANT
SUT SILK 2 0 TIES 17X18 (SUTURE) ×2
SUT SILK 2-0 18XBRD TIE BLK (SUTURE) IMPLANT
SUT SILK 3 0 SH CR/8 (SUTURE) ×1 IMPLANT
SUT SILK 4 0 TIE 10X30 (SUTURE) ×2 IMPLANT
SUT STEEL 6MS V (SUTURE) ×2 IMPLANT
SUT STEEL SZ 6 DBL 3X14 BALL (SUTURE) ×2 IMPLANT
SUT TEM PAC WIRE 2 0 SH (SUTURE) ×1 IMPLANT
SUT VIC AB 1 CTX 18 (SUTURE) ×4 IMPLANT
SUT VIC AB 2-0 CT1 27 (SUTURE) ×2
SUT VIC AB 2-0 CT1 TAPERPNT 27 (SUTURE) IMPLANT
SUT VIC AB 2-0 CTX 27 (SUTURE) ×2 IMPLANT
SUT VIC AB 3-0 X1 27 (SUTURE) ×2 IMPLANT
SUTURE E-PAK OPEN HEART (SUTURE) ×2 IMPLANT
SYSTEM SAHARA CHEST DRAIN ATS (WOUND CARE) ×2 IMPLANT
TAPE CLOTH SURG 4X10 WHT LF (GAUZE/BANDAGES/DRESSINGS) ×1 IMPLANT
TAPE PAPER 2X10 WHT MICROPORE (GAUZE/BANDAGES/DRESSINGS) ×1 IMPLANT
TOWEL OR 17X24 6PK STRL BLUE (TOWEL DISPOSABLE) ×4 IMPLANT
TOWEL OR 17X26 10 PK STRL BLUE (TOWEL DISPOSABLE) ×4 IMPLANT
TRAY FOLEY IC TEMP SENS 16FR (CATHETERS) ×2 IMPLANT
TUBING INSUFFLATION (TUBING) ×2 IMPLANT
UNDERPAD 30X30 INCONTINENT (UNDERPADS AND DIAPERS) ×2 IMPLANT
VALVE MAGNA EASE AORTIC 23MM (Prosthesis & Implant Heart) ×1 IMPLANT
VENT LEFT HEART 12002 (CATHETERS) ×2
WATER STERILE IRR 1000ML POUR (IV SOLUTION) ×4 IMPLANT

## 2015-03-30 NOTE — Transfer of Care (Signed)
Immediate Anesthesia Transfer of Care Note  Patient: TAVITA EASTHAM  Procedure(s) Performed: Procedure(s): CORONARY ARTERY BYPASS GRAFTING (CABG) x four, using left internal mammary artery and left    leg greater saphenous vein harvested endoscopically  (N/A) AORTIC VALVE REPLACEMENT (AVR) (N/A) TRANSESOPHAGEAL ECHOCARDIOGRAM (TEE) (N/A)  Patient Location: SICU  Anesthesia Type:General  Level of Consciousness: sedated, unresponsive and Patient remains intubated per anesthesia plan  Airway & Oxygen Therapy: Patient remains intubated per anesthesia plan and Patient placed on Ventilator (see vital sign flow sheet for setting)  Post-op Assessment: Report given to RN and Post -op Vital signs reviewed and stable  Post vital signs: Reviewed and stable  Last Vitals:  Filed Vitals:   03/30/15 1557  BP: 110/57  Pulse: 80  Temp:   Resp: 14    Complications: No apparent anesthesia complications

## 2015-03-30 NOTE — OR Nursing (Signed)
Skin-closure call to SICU at 1510.

## 2015-03-30 NOTE — Anesthesia Postprocedure Evaluation (Signed)
  Anesthesia Post-op Note  Patient: Troy Banks  Procedure(s) Performed: Procedure(s): CORONARY ARTERY BYPASS GRAFTING (CABG) x four, using left internal mammary artery and left    leg greater saphenous vein harvested endoscopically  (N/A) AORTIC VALVE REPLACEMENT (AVR) (N/A) TRANSESOPHAGEAL ECHOCARDIOGRAM (TEE) (N/A)  Patient Location: ICU  Anesthesia Type:General  Level of Consciousness: sedated  Airway and Oxygen Therapy: Patient remains intubated per anesthesia plan  Post-op Pain: none  Post-op Assessment: Post-op Vital signs reviewed, PATIENT'S CARDIOVASCULAR STATUS UNSTABLE, RESPIRATORY FUNCTION UNSTABLE and Patent Airway              Post-op Vital Signs: Reviewed  Last Vitals:  Filed Vitals:   03/30/15 1700  BP: 104/58  Pulse: 80  Temp: 36.2 C  Resp: 12    Complications: No apparent anesthesia complications

## 2015-03-30 NOTE — Telephone Encounter (Signed)
As noted - the recommended are mine as relayed to [employer]; I accept pharm recommended tx as well. Appreciate the team care of this patient!

## 2015-03-30 NOTE — OR Nursing (Signed)
SICU 45 minute call @ 1434

## 2015-03-30 NOTE — Progress Notes (Signed)
TCTS BRIEF SICU PROGRESS NOTE  Day of Surgery  S/P Procedure(s) (LRB): CORONARY ARTERY BYPASS GRAFTING (CABG) x four, using left internal mammary artery and left    leg greater saphenous vein harvested endoscopically  (N/A) AORTIC VALVE REPLACEMENT (AVR) (N/A) TRANSESOPHAGEAL ECHOCARDIOGRAM (TEE) (N/A)   Sedated on vent AAI paced w/ stable hemodynamics off all drips Chest tube output low UOP adequate Labs okay  Plan: Continue routine early postop  Rexene Alberts, MD 03/30/2015 6:38 PM

## 2015-03-30 NOTE — Progress Notes (Signed)
  Echocardiogram Echocardiogram Transesophageal has been performed.  Troy Banks M 03/30/2015, 8:18 AM

## 2015-03-30 NOTE — Brief Op Note (Addendum)
      [address]       [place] [address]             [phone]     03/30/2015  1:23 PM  PATIENT:  Troy Banks  74 y.o. male  PRE-OPERATIVE DIAGNOSIS:  1. S/p NSTEMI 2.CAD 3.Moderate AS  POST-OPERATIVE DIAGNOSIS: 1. S/p NSTEMI 2.CAD 3.Moderate AS  PROCEDURE:  TRANSESOPHAGEAL ECHOCARDIOGRAM (TEE), MEDIAN STERNOTOMY for CORONARY ARTERY BYPASS GRAFTING (CABG) x 4 (LIMA to LAD, SVG to CIRCUMFLEX, SVG to RAMUS INTERMEDIATE, SVG to RCA), using left internal mammary artery and left leg greater saphenous vein harvested endoscopically, AORTIC VALVE REPLACEMENT (AVR) (using a Pericardial Tissue Valve, size 23 mm)  SURGEON:  Surgeon(s) and Role:  Grace Isaac, MD - Primary  PHYSICIAN ASSISTANT: Lars Pinks [name]  ANESTHESIA:   general  EBL:  Total I/O In: -  Out: 850 [Urine:850]   DRAINS: Chest tubes placed in the mediastinal and pleural spaces   SPECIMEN:  Source of Specimen:  Native aortic valve leaftlets  DISPOSITION OF SPECIMEN:  PATHOLOGY  COUNTS CORRECT:  YES  DICTATION: .Dragon Dictation  PLAN OF CARE: Admit to inpatient   PATIENT DISPOSITION:  ICU - intubated and hemodynamically stable.   Delay start of Pharmacological VTE agent (>24hrs) due to surgical blood loss or risk of bleeding: yes  BASELINE WEIGHT: 106 kg  Aortic Valve Etiology   Aortic Insufficiency:  Trivial/Trace  Aortic Valve Disease:  Yes.  Aortic Stenosis:  Yes. Smallest Aortic Valve Area: 0.56cm2; Highest Mean Gradient: 26mmHg.  Etiology (Choose at least one and up to  5 etiologies):  Degenerative - Calcified  Aortic Valve  Procedure Performed:  Replacement: Yes.  Bioprosthetic Valve. Implant Model Number:[id number], Size:23 mm, Unique Device Identifier:[id number]  Repair/Reconstruction: No.   Aortic Annular Enlargement: No.

## 2015-03-30 NOTE — Progress Notes (Signed)
Pt spontaneously awoke after Propofol sedation was being slowly titrated down. Pt immediately began thrashing in bed bringing knees up to chest and arms above head, thrashing head from side to side, pt calmly given instructions but could/would not follow instructions to calm down. Pt did squeeze had and open eyes to command but would not follow any other commands. Pt moved all extremities equally against resistance.   Will attempt to wean pt again later.

## 2015-03-30 NOTE — OR Nursing (Signed)
OR departure call to SICU at 1545.

## 2015-03-30 NOTE — Anesthesia Procedure Notes (Addendum)
Anesthesia Procedure Note Central line insertion note. Skin prepped and draped in sterile fashion. Patent vessel identified on u/s using linear probe. Needle advanced under live u/s guidance with aspiration of blood upon entry into vessel. Catheter passed easily over finder needle. Wire passed easily through catheter and location confirmed with u/s. Image saved in chart. Introducer catheter advanced over wire, with aspiration of blood through all ports for confirmation. Line sutured and dressing applied. Pt tolerated well with no immediate complications.  Deatra Canter, MD Procedure Name: Intubation Date/Time: 03/30/2015 8:48 AM Performed by: Kerby Less Pre-anesthesia Checklist: Patient identified, Emergency Drugs available, Suction available, Patient being monitored and Timeout performed Patient Re-evaluated:Patient Re-evaluated prior to inductionOxygen Delivery Method: Circle system utilized Preoxygenation: Pre-oxygenation with 100% oxygen Intubation Type: IV induction and Cricoid Pressure applied Ventilation: Mask ventilation with difficulty and Oral airway inserted - appropriate to patient size Grade View: Grade IV Tube type: Oral Tube size: 8.0 mm Number of attempts: 2 Airway Equipment and Method: Stylet and Video-laryngoscopy Placement Confirmation: ETT inserted through vocal cords under direct vision,  positive ETCO2 and breath sounds checked- equal and bilateral Secured at: 25 cm Tube secured with: Tape Dental Injury: Teeth and Oropharynx as per pre-operative assessment  Difficulty Due To: Difficulty was anticipated, Difficult Airway- due to reduced neck mobility and Difficult Airway- due to large tongue Future Recommendations: Recommend- induction with short-acting agent, and alternative techniques readily available Comments: First DL c MAC 4 no airway structures  Visualized. Mask ventilated c opa good visualization c glide scope ett inserted s probs

## 2015-03-30 NOTE — Progress Notes (Signed)
Pt has order for CPAP entered 03/27/15. I did not see any notes in system that patient had refused but was reported to that he refuses. I checked with the patient and he said that he would wear but it was to late tonight. He would like to wear Monday night. RT explained someone would check with him. Im not sure why this has not be checked.

## 2015-03-31 ENCOUNTER — Encounter (HOSPITAL_COMMUNITY): Payer: Self-pay | Admitting: Cardiothoracic Surgery

## 2015-03-31 ENCOUNTER — Inpatient Hospital Stay (HOSPITAL_COMMUNITY): Payer: Medicare Other

## 2015-03-31 LAB — CBC
HCT: 35.8 % — ABNORMAL LOW (ref 39.0–52.0)
HCT: 35.8 % — ABNORMAL LOW (ref 39.0–52.0)
Hemoglobin: 11.9 g/dL — ABNORMAL LOW (ref 13.0–17.0)
Hemoglobin: 11.9 g/dL — ABNORMAL LOW (ref 13.0–17.0)
MCH: 30.3 pg (ref 26.0–34.0)
MCH: 30.7 pg (ref 26.0–34.0)
MCHC: 33.2 g/dL (ref 30.0–36.0)
MCHC: 33.2 g/dL (ref 30.0–36.0)
MCV: 91.1 fL (ref 78.0–100.0)
MCV: 92.3 fL (ref 78.0–100.0)
PLATELETS: 107 10*3/uL — AB (ref 150–400)
Platelets: 91 10*3/uL — ABNORMAL LOW (ref 150–400)
RBC: 3.93 MIL/uL — ABNORMAL LOW (ref 4.22–5.81)
RBC: 3.88 MIL/uL — ABNORMAL LOW (ref 4.22–5.81)
RDW: 13.8 % (ref 11.5–15.5)
RDW: 14 % (ref 11.5–15.5)
WBC: 9 10*3/uL (ref 4.0–10.5)
WBC: 11 10*3/uL — ABNORMAL HIGH (ref 4.0–10.5)

## 2015-03-31 LAB — GLUCOSE, CAPILLARY
GLUCOSE-CAPILLARY: 117 mg/dL — AB (ref 65–99)
GLUCOSE-CAPILLARY: 124 mg/dL — AB (ref 65–99)
GLUCOSE-CAPILLARY: 125 mg/dL — AB (ref 65–99)
GLUCOSE-CAPILLARY: 132 mg/dL — AB (ref 65–99)
GLUCOSE-CAPILLARY: 142 mg/dL — AB (ref 65–99)
GLUCOSE-CAPILLARY: 143 mg/dL — AB (ref 65–99)
GLUCOSE-CAPILLARY: 206 mg/dL — AB (ref 65–99)
Glucose-Capillary: 111 mg/dL — ABNORMAL HIGH (ref 65–99)
Glucose-Capillary: 120 mg/dL — ABNORMAL HIGH (ref 65–99)
Glucose-Capillary: 122 mg/dL — ABNORMAL HIGH (ref 65–99)
Glucose-Capillary: 123 mg/dL — ABNORMAL HIGH (ref 65–99)
Glucose-Capillary: 123 mg/dL — ABNORMAL HIGH (ref 65–99)
Glucose-Capillary: 126 mg/dL — ABNORMAL HIGH (ref 65–99)
Glucose-Capillary: 134 mg/dL — ABNORMAL HIGH (ref 65–99)
Glucose-Capillary: 149 mg/dL — ABNORMAL HIGH (ref 65–99)
Glucose-Capillary: 153 mg/dL — ABNORMAL HIGH (ref 65–99)
Glucose-Capillary: 158 mg/dL — ABNORMAL HIGH (ref 65–99)
Glucose-Capillary: 177 mg/dL — ABNORMAL HIGH (ref 65–99)
Glucose-Capillary: 186 mg/dL — ABNORMAL HIGH (ref 65–99)

## 2015-03-31 LAB — POCT I-STAT, CHEM 8
BUN: 12 mg/dL (ref 6–20)
CREATININE: 0.9 mg/dL (ref 0.61–1.24)
Calcium, Ion: 1.24 mmol/L (ref 1.13–1.30)
Chloride: 101 mmol/L (ref 101–111)
Glucose, Bld: 223 mg/dL — ABNORMAL HIGH (ref 65–99)
HEMATOCRIT: 36 % — AB (ref 39.0–52.0)
HEMOGLOBIN: 12.2 g/dL — AB (ref 13.0–17.0)
Potassium: 4.4 mmol/L (ref 3.5–5.1)
SODIUM: 134 mmol/L — AB (ref 135–145)
TCO2: 20 mmol/L (ref 0–100)

## 2015-03-31 LAB — BASIC METABOLIC PANEL
Anion gap: 5 (ref 5–15)
BUN: 10 mg/dL (ref 6–20)
CALCIUM: 8.3 mg/dL — AB (ref 8.9–10.3)
CO2: 25 mmol/L (ref 22–32)
Chloride: 104 mmol/L (ref 101–111)
Creatinine, Ser: 1.03 mg/dL (ref 0.61–1.24)
GFR calc Af Amer: >60 mL/min (ref >60)
GFR calc non Af Amer: >60 mL/min (ref >60)
Glucose, Bld: 126 mg/dL — ABNORMAL HIGH (ref 65–99)
Potassium: 4 mmol/L (ref 3.5–5.1)
SODIUM: 134 mmol/L — AB (ref 135–145)

## 2015-03-31 LAB — POCT I-STAT 3, ART BLOOD GAS (G3+)
ACID-BASE DEFICIT: 2 mmol/L (ref 0.0–2.0)
ACID-BASE DEFICIT: 2 mmol/L (ref 0.0–2.0)
BICARBONATE: 23.2 meq/L (ref 20.0–24.0)
BICARBONATE: 22.6 meq/L (ref 20.0–24.0)
O2 SAT: 98 %
O2 SAT: 95 %
PH ART: 7.36 (ref 7.350–7.450)
PO2 ART: 110 mmHg — AB (ref 80.0–100.0)
PO2 ART: 83 mmHg (ref 80.0–100.0)
Patient temperature: 37.7
TCO2: 24 mmol/L (ref 0–100)
TCO2: 24 mmol/L (ref 0–100)
pCO2 arterial: 41.3 mmHg (ref 35.0–45.0)
pCO2 arterial: 39.4 mmHg (ref 35.0–45.0)
pH, Arterial: 7.37 (ref 7.350–7.450)

## 2015-03-31 LAB — CREATININE, SERUM
Creatinine, Ser: 1.03 mg/dL (ref 0.61–1.24)
GFR calc Af Amer: >60 mL/min (ref >60)
GFR calc non Af Amer: >60 mL/min (ref >60)

## 2015-03-31 LAB — MAGNESIUM
Magnesium: 2.1 mg/dL (ref 1.7–2.4)
Magnesium: 1.9 mg/dL (ref 1.7–2.4)

## 2015-03-31 MED ORDER — HYDROMORPHONE HCL 2 MG PO TABS
2.0000 mg | ORAL_TABLET | ORAL | Status: DC | PRN
  Administered 2015-03-31 (×3): 2 mg via ORAL
  Administered 2015-04-01 – 2015-04-04 (×5): 4 mg via ORAL
  Administered 2015-04-05: 2 mg via ORAL
  Filled 2015-03-31 (×2): qty 2
  Filled 2015-03-31 (×2): qty 1
  Filled 2015-03-31 (×4): qty 2

## 2015-03-31 MED ORDER — INSULIN DETEMIR 100 UNIT/ML ~~LOC~~ SOLN
15.0000 [IU] | Freq: Once | SUBCUTANEOUS | Status: AC
  Administered 2015-03-31: 15 [IU] via SUBCUTANEOUS
  Filled 2015-03-31: qty 0.15

## 2015-03-31 MED ORDER — INSULIN ASPART 100 UNIT/ML ~~LOC~~ SOLN
0.0000 [IU] | SUBCUTANEOUS | Status: DC
  Administered 2015-03-31 (×2): 2 [IU] via SUBCUTANEOUS
  Administered 2015-03-31: 8 [IU] via SUBCUTANEOUS
  Administered 2015-04-01 (×3): 2 [IU] via SUBCUTANEOUS

## 2015-03-31 MED ORDER — INSULIN DETEMIR 100 UNIT/ML ~~LOC~~ SOLN
15.0000 [IU] | Freq: Every day | SUBCUTANEOUS | Status: DC
Start: 2015-04-01 — End: 2015-04-03
  Administered 2015-04-01 – 2015-04-02 (×2): 15 [IU] via SUBCUTANEOUS
  Filled 2015-03-31 (×3): qty 0.15

## 2015-03-31 MED ORDER — ATENOLOL 25 MG PO TABS
12.5000 mg | ORAL_TABLET | Freq: Two times a day (BID) | ORAL | Status: DC
  Administered 2015-04-01 – 2015-04-03 (×5): 12.5 mg via ORAL
  Filled 2015-03-31 (×8): qty 1

## 2015-03-31 MED ORDER — ENOXAPARIN SODIUM 40 MG/0.4ML ~~LOC~~ SOLN
40.0000 mg | Freq: Every day | SUBCUTANEOUS | Status: DC
  Administered 2015-03-31 – 2015-04-05 (×6): 40 mg via SUBCUTANEOUS
  Filled 2015-03-31 (×7): qty 0.4

## 2015-03-31 MED FILL — Heparin Sodium (Porcine) Inj 1000 Unit/ML: INTRAMUSCULAR | Qty: 30 | Status: AC

## 2015-03-31 MED FILL — Electrolyte-R (PH 7.4) Solution: INTRAVENOUS | Qty: 4000 | Status: AC

## 2015-03-31 MED FILL — Potassium Chloride Inj 2 mEq/ML: INTRAVENOUS | Qty: 40 | Status: AC

## 2015-03-31 MED FILL — Lidocaine HCl IV Inj 20 MG/ML: INTRAVENOUS | Qty: 5 | Status: AC

## 2015-03-31 MED FILL — Sodium Bicarbonate IV Soln 8.4%: INTRAVENOUS | Qty: 50 | Status: AC

## 2015-03-31 MED FILL — Magnesium Sulfate Inj 50%: INTRAMUSCULAR | Qty: 10 | Status: AC

## 2015-03-31 MED FILL — Sodium Chloride IV Soln 0.9%: INTRAVENOUS | Qty: 2000 | Status: AC

## 2015-03-31 MED FILL — Mannitol IV Soln 20%: INTRAVENOUS | Qty: 500 | Status: AC

## 2015-03-31 NOTE — Progress Notes (Addendum)
Pt c/o chest pain; frequent PVC"s at this time; pt given IV morphine and pacer AAI @ 80; will cont. To monitor.  Ruben Reason

## 2015-03-31 NOTE — Op Note (Signed)
NAMEJOANGEL, VANOSDOL NO.:  [id number]  MEDICAL RECORD NO.:  [id number]  LOCATION:  2S13C                        FACILITY:  [facility]  PHYSICIAN:  Lanelle Bal, MD    DATE OF BIRTH:  Oct 05, 1940  DATE OF PROCEDURE:  03/30/2015 DATE OF DISCHARGE:                              OPERATIVE REPORT   PREOPERATIVE DIAGNOSIS:  Three-vessel coronary artery disease with recent non-STEMI myocardial infarction and moderate aortic stenosis.  POSTOPERATIVE DIAGNOSIS:  Three-vessel coronary artery disease with recent non-STEMI myocardial infarction and moderate aortic stenosis.  SURGICAL PROCEDURE:  Coronary artery bypass grafting x4, with the left internal mammary to the left anterior descending coronary artery, reverse saphenous vein graft to the intermediate coronary artery, reverse saphenous vein graft to the circumflex coronary artery, reverse saphenous vein graft to the distal right coronary artery, and aortic valve replacement with a pericardial tissue valve [employer], model #3300TFX, 23 mm, serial #[id number] and Endovein greater at the left, greater saphenous thigh and calf Endovein harvesting.  SURGEON:  Lanelle Bal, M.D.  FIRST ASSISTANT:  Lars Pinks, PA.  BRIEF HISTORY:  The patient is a 74 year old male, who presents with acute chest pain symptoms with positive troponin.  He underwent cardiac catheterization by Dr. Ellyn Hack.  At the time of catheterization, he was found to have severe three-vessel coronary artery disease with a very high-grade mid right coronary artery lesion and disease in his LAD of 80%.  Circumflex of 80% and intermediate pulmonary vessels.  The overall ventricular function was preserved.  Right heart catheterization and calculation of valve area was not performed at the time of cardiac catheterization, but echocardiogram showed a significant amount of calcification in the aortic valve and a maximum peak velocity across  the valve of 320 cm a second, with review of the echo findings and the morphology of the valve on echo, it was felt the patient had at least moderate aortic stenosis and would also benefit from an aortic valve replacement.  As his degree of stenosis would likely progress within the next several years.  Risks and options were discussed with the patient. He did not wish to take Coumadin and the tissue valve was decided upon. The patient agreed with proceeding with surgery as outlined and signed informed consent.  DESCRIPTION OF PROCEDURE:  With Swan-Ganz and arterial line monitors in place, the patient underwent general endotracheal anesthesia without incident.  TEE probe was placed and again confirmed a significantly calcified aortic valve.  With the left and non coronary cusp leaflets had heavily calcified and fused together.  The right coronary cusp leaflet was fairly mobile.  The aortic stenosis was graded at least moderate.  The skin of the chest and legs was prepped with Betadine and draped in usual sterile manner.  Appropriate time-out was performed. Small incision was made at the right knee and Endo vein harvesting was started; however, this vein appeared to have many branches in at the knee and so was not harvested.  A small incision was made at the left knee in using the [employer] Endo vein harvesting system.  The segment of good quality vein from the left greater saphenous thigh and upper calf. It was harvested endoscopically and  was of good quality.  Median sternotomy was performed.  The left internal mammary artery was dissected down as pedicle graft.  The distal artery was divided and had good free flow.  Pericardium was opened.  The patient had evidence of significant left ventricular hypertrophy.  He was systemically heparinized.  The ascending aorta was cannulated.  The right atrium was cannulated.  Retrograde cardioplegic catheter was placed.  An aortic root vent  cardioplegia needle was placed in the ascending aorta.  The patient was placed on cardiopulmonary bypass 2.4 L/min/m2.  Sites of anastomosis were selected and dissected out the epicardium.  A right superior pulmonary vein vent was placed.  The patient was placed on cardiopulmonary bypass.  It should be noted that the patient's coronaries distally were very diseased and relatively small.  With the patient's body temperature cooled to 32 degrees, the aortic crossclamp was applied and 800 mL of cold blood cardioplegia was administered antegrade.  Additional cold blood cardioplegia was administered retrograde.  The distal right coronary artery was first opened and admitted a 1.5 mm probe distally.  Using a running 7-0 Prolene, distal anastomosis was performed with a second reverse saphenous vein graft. Additional cold blood cardioplegia was administered.  The heart was then elevated and the distal circumflex was the circumflex vessel was opened it was a very diffusely diseased vessel with significant posterior wall plaque.  Using a running 7-0 Prolene, distal anastomosis was performed. Attention was then turned to the intermediate coronary artery.  The proximal 2/3rd of this vessel were significantly calcified and distal third of the vessel was opened and had significant posterior wall plaque and overall it was a poor quality vessel.  Using a running 7-0 Prolene, distal anastomosis was performed.  Attention was then turned to the distal LAD, which was also a relatively small vessel, but was patent and admitted a 1 mm probe distally.  Using a running 8-0 Prolene, left internal mammary artery was anastomosed to left anterior descending coronary artery.  With removal of the bulldog on the mammary artery, there was prompt rise in myocardial septal temperature.  The bulldog was placed back on the mammary artery.  Intermittently cold blood cardioplegia was administered in the vein grafts and  retrograde.  We then proceeded to open the proximal ascending aorta.  A transverse aortotomy was performed.  This gave a good visualization of the trileaflet aortic valve.  Has noted on echo.  The left and noncoronary cusp were highly calcified and leaflets were fused.  Functionally making the valve based on the right coronary cusp only, the valve was excised, the annulus was debrided, care was taken to remove all loose calcific debris.  The annulus was sized for 23 pericardial valve.  Using #2 Tycron pledgeted sutures with pledgets on the ventricular surface, 14 sutures were placed circumferentially in the annulus and used to secure the valve in place.  The valve seated well without impingement on the right or left coronary ostium.  The aortotomy was then closed with horizontal mattress running 3-0 Prolene suture over felt strips, and a second layer of running 4-0 Prolene.  With crossclamp still in place, three punch aortotomies were performed and each of the 3 vein grafts were anastomosed to the ascending aorta.  Before completing the anastomosis, the bulldog on the mammary artery was removed with prompt rise in myocardial septal temperature.  The heart was allowed to passively fill and de-air.  CO2 at 5 L a minute had been run into the pericardial  space to assist in removal of air.  The aortic crossclamp was removed with a total crossclamp time of 179 minutes.  The patient initially was in complete heart block and required placement of atrial and ventricular pacing wires and was paced.  This ultimately converted to a slow sinus rhythm and was atrially paced.  The patient was then ventilated and weaned from cardiopulmonary bypass without difficulty. He remained hemodynamically stable without pressors.  He was decannulated in usual fashion.  The coronary sinus and vent had been removed.  TEE showed good function of the valve without perivalvular leak.  Protamine sulfate was administered  with operative field hemostatic and a left pleural tube and a Blake mediastinal drain were left in place.  Pericardium was loosely reapproximated.  Sternum was closed #6 stainless steel wire.  Fascia closed with interrupted 0 Vicryl and 3-0 Vicryl in subcutaneous tissue for the subcuticular stitch in skin edges.  Dry dressings were applied.  Sponge and needle count was reported as correct at completion of procedure. The patient tolerated the procedure without obvious complication. Transferred to the Surgical Intensive care Unit for further postoperative care.  Total pump time was 222 minutes.  The patient did not require any blood product during the operative procedure.     Lanelle Bal, MD     EG/MEDQ  D:  03/31/2015  T:  03/31/2015  Job:  [id number]

## 2015-03-31 NOTE — Progress Notes (Signed)
Patient ID: Troy Banks, male   DOB: [birth year], 74 y.o.   MRN: [id number] [facility] DAILY ICU PROGRESS NOTE                   [address]            [place] [address]          [phone]   1 Day Post-Op Procedure(s) (LRB): CORONARY ARTERY BYPASS GRAFTING (CABG) x four, using left internal mammary artery and left    leg greater saphenous vein harvested endoscopically  (N/A) AORTIC VALVE REPLACEMENT (AVR) (N/A) TRANSESOPHAGEAL ECHOCARDIOGRAM (TEE) (N/A)  Total Length of Stay:  LOS: 5 days   Subjective: Awake and alert , extubated and neuro intact, says he woke smoothly  Objective: Vital signs in last 24 hours: Temp:  [97 F (36.1 C)-100.2 F (37.9 C)] 99.7 F (37.6 C) (10/25 0700) Pulse Rate:  [70-81] 70 (10/25 0700) Cardiac Rhythm:  [-] Atrial paced (10/25 0700) Resp:  [11-26] 23 (10/25 0700) BP: (81-129)/(34-90) 129/46 mmHg (10/25 0700) SpO2:  [94 %-100 %] 96 % (10/25 0700) Arterial Line BP: (93-156)/(48-79) 138/57 mmHg (10/25 0700) FiO2 (%):  [50 %] 50 % (10/25 0222) Weight:  [236 lb 6.4 oz (107.23 kg)] 236 lb 6.4 oz (107.23 kg) (10/25 0452)  Filed Weights   03/29/15 0540 03/30/15 0513 03/31/15 0452  Weight: 232 lb 12.9 oz (105.6 kg) 233 lb 7.1 oz (105.89 kg) 236 lb 6.4 oz (107.23 kg)    Weight change: 2 lb 15.3 oz (1.34 kg)   Hemodynamic parameters for last 24 hours: PAP: (29-56)/(11-39) 31/11 mmHg CO:  [5.3 L/min-7.5 L/min] 6.3 L/min CI:  [2.4 L/min/m2-3.4 L/min/m2] 2.9 L/min/m2  Intake/Output from previous day: 10/24 0701 - 10/25 0700 In: 5170.7 [I.V.:3556.7; Blood:604; NG/GT:60; IV Piggyback:950] Out: 4200 [Urine:2545; Emesis/NG output:150; Blood:1150; Chest Tube:355]  Intake/Output this shift:    Current Meds: Scheduled Meds: . acetaminophen  1,000 mg Oral 4 times per day   Or  . acetaminophen (TYLENOL) oral liquid 160 mg/5 mL  1,000 mg Per Tube 4 times per day  . allopurinol  100 mg Oral BID  . antiseptic oral rinse  7 mL Mouth  Rinse QID  . atorvastatin  20 mg Oral q1800  . bisacodyl  10 mg Oral Daily   Or  . bisacodyl  10 mg Rectal Daily  . cefUROXime (ZINACEF)  IV  1.5 g Intravenous Q12H  . chlorhexidine gluconate  15 mL Mouth Rinse BID  . cholecalciferol  2,000 Units Oral Daily  . docusate sodium  200 mg Oral Daily  . famotidine (PEPCID) IV  20 mg Intravenous Q12H  . insulin regular  0-10 Units Intravenous TID WC  . primidone  100 mg Oral Daily   And  . primidone  50 mg Oral QHS  . ranitidine  150 mg Oral BID  . sodium chloride  3 mL Intravenous Q12H   Continuous Infusions: . sodium chloride 10 mL/hr at 03/31/15 0700  . sodium chloride    . sodium chloride 1 mL (03/30/15 1550)  . insulin (NOVOLIN-R) infusion 3.8 Units/hr (03/31/15 0700)  . lactated ringers    . lactated ringers 20 mL/hr at 03/31/15 0700  . nitroGLYCERIN Stopped (03/31/15 0130)  . phenylephrine (NEO-SYNEPHRINE) Adult infusion Stopped (03/30/15 1817)  . propofol (DIPRIVAN) infusion Stopped (03/31/15 0215)   PRN Meds:.sodium chloride, albumin human, lactated ringers, morphine injection, ondansetron (ZOFRAN) IV, sodium chloride  General appearance: alert, cooperative and no distress Neurologic: intact Heart:  regular rate and rhythm, S1, S2 normal, no murmur, click, rub or gallop and atrial paced Lungs: diminished breath sounds bibasilar Abdomen: soft, non-tender; bowel sounds normal; no masses,  no organomegaly Extremities: extremities normal, atraumatic, no cyanosis or edema and Homans sign is negative, no sign of DVT Wound: sternum stable  Lab Results: CBC: Recent Labs  03/30/15 2200 03/30/15 2204 03/31/15 0427  WBC 9.0  --  9.0  HGB 11.7* 11.6* 11.9*  HCT 35.4* 34.0* 35.8*  PLT 110*  --  107*   BMET:  Recent Labs  03/30/15 0310  03/30/15 2204 03/31/15 0427  NA 139  < > 138 134*  K 3.8  < > 4.5 4.0  CL 105  < > 104 104  CO2 23  --   --  25  GLUCOSE 103*  < > 158* 126*  BUN 10  < > 11 10  CREATININE 0.96  < >  1.00 1.03  CALCIUM 9.4  --   --  8.3*  < > = values in this interval not displayed.  PT/INR:  Recent Labs  03/30/15 1555  LABPROT 17.9*  INR 1.47   Radiology: Dg Chest Port 1 View  03/30/2015  CLINICAL DATA:  Status post CABG EXAM: PORTABLE CHEST 1 VIEW COMPARISON:  03/25/2015 FINDINGS: There is a left chest tube. There is mediastinal drain. There is an NG tube which projects over the stomach. There is an endotracheal tube with tip about 4 cm above the carina. A right Swan-Ganz central venous catheter is seen with tip in the right pulmonary artery. Mild status post CABG enlargement of the cardiac silhouette as anticipated. Cardiac valve replacement noted. Mild hypoventilatory change in the mid to lower lung zones. No evidence of postoperative edema. IMPRESSION: Anticipated postoperative appearance. Electronically Signed   By: Skipper Cliche M.D.   On: 03/30/2015 16:53     Assessment/Plan: S/P Procedure(s) (LRB): CORONARY ARTERY BYPASS GRAFTING (CABG) x four, using left internal mammary artery and left    leg greater saphenous vein harvested endoscopically  (N/A) AORTIC VALVE REPLACEMENT (AVR) (N/A) TRANSESOPHAGEAL ECHOCARDIOGRAM (TEE) (N/A) Mobilize Diuresis Diabetes control d/c tubes/lines See progression orders Coordinate with pharmacy po pain meds Use atenolol as beta blocker rather then lopressor Expected Acute  Blood - loss Anemia Renal function stable  Grace Isaac 03/31/2015 7:29 AM

## 2015-03-31 NOTE — Progress Notes (Signed)
Spoke with pharmacist regarding PRN pain meds; after further research, pharmacist stated that patient could take PO Dilaudid 2-4 mg; RN spoke with Dr. Servando Snare regarding poss new order; per MD ok to give PO Dilaudid; pt family at beside ok with new med order; will administer when available from pharmacy; will cont. To monitor.  Troy Banks

## 2015-03-31 NOTE — Progress Notes (Signed)
Pt passed weaning parameters prior to extubation. Pt was stable throughout weaning parameters. RN at bedside.   NIF:  -50 VC: 1.36

## 2015-03-31 NOTE — Progress Notes (Signed)
Utilization review complete. Mirah Nevins RN CCM Case Mgmt phone [phone] 

## 2015-03-31 NOTE — Significant Event (Signed)
Spoke twice to [employer] pharmacist Rod Holler regarding PO pain medications, this in regards to Dr. Everrett Coombe verbal order to have pharmacy switch patient's pain medication to PO. Per pharmacist, will research what else patient can have due to the C450. Spoke with pharmacist again at 1138am and she recommended PO Morphine IR. However, patient's family decline this. Also, patient and family does not want the morphine IV when offered. Patient is stable, continue to adjust positions for comfort. Tylenol given as scheduled.

## 2015-03-31 NOTE — Progress Notes (Signed)
26mL Propofol wasted in sharps witnessed by Vickii Penna, RN

## 2015-03-31 NOTE — Progress Notes (Signed)
Pt again spontaneously awoke, thrashing around in bed, unable to follow commands to remain calm. Will attempt to wean again when pt can calmly follow commands.

## 2015-03-31 NOTE — Procedures (Signed)
Extubation Procedure Note  Patient Details:   Name: Troy Banks DOB: Jan 21, 1941 MRN: [id number]   Airway Documentation:  Airway 8 mm (Active)  Secured at (cm) 25.5 cm 03/31/2015  2:22 AM  Measured From Lips 03/31/2015  2:22 AM  Secured Location Right 03/31/2015  2:22 AM  Secured By Pink Tape 03/31/2015  2:22 AM  Site Condition Dry 03/31/2015  2:22 AM    Evaluation  O2 sats: stable throughout and currently acceptable Complications: No apparent complications Patient did tolerate procedure well. Bilateral Breath Sounds: Clear, Diminished Suctioning: Oral, Airway Yes   Pt is stable at this time. Extubation procedure was explained to pt, pt followed commands and passed all weaning parameters.  Pt had an audible cuff leak prior to extubation. Pt was extubated to a 4L [place] O2 humidity provided. Pt was able to state his name after extubation when ask. No complications noted throughout procedure, pt was stable throughout.    Leigh Aurora 03/31/2015, 3:21 AM

## 2015-03-31 NOTE — Progress Notes (Signed)
Weaning initiated. Pt is now on PS/CPAP. Pt is tolerating it well, but only following some commands. RT/RN at bedside and will continue to monitor.

## 2015-03-31 NOTE — Progress Notes (Signed)
Came to assess pt, pt is stable at this time, resting comfortably. RN at bedside. RT will continue to monitor pt closely throughout the night

## 2015-03-31 NOTE — Significant Event (Signed)
Patient's family (ex-spouse and significant other Angie) want patient to have full liquid for lunch. Patient does not have any complaints of nausea or emesis at all today. RN confirmed with patient who verbalize agreement for full liquid for lunch time. RN advanced diet to carb modified this evening. Spoke with Dr. Servando Snare regarding elevated sugars, likely related to patient's oral intake from liquid tray.

## 2015-04-01 ENCOUNTER — Inpatient Hospital Stay (HOSPITAL_COMMUNITY): Payer: Medicare Other

## 2015-04-01 LAB — GLUCOSE, CAPILLARY
GLUCOSE-CAPILLARY: 114 mg/dL — AB (ref 65–99)
Glucose-Capillary: 136 mg/dL — ABNORMAL HIGH (ref 65–99)
Glucose-Capillary: 160 mg/dL — ABNORMAL HIGH (ref 65–99)
Glucose-Capillary: 119 mg/dL — ABNORMAL HIGH (ref 65–99)
Glucose-Capillary: 150 mg/dL — ABNORMAL HIGH (ref 65–99)

## 2015-04-01 LAB — CBC
HCT: 36.1 % — ABNORMAL LOW (ref 39.0–52.0)
Hemoglobin: 11.7 g/dL — ABNORMAL LOW (ref 13.0–17.0)
MCH: 30.2 pg (ref 26.0–34.0)
MCHC: 32.4 g/dL (ref 30.0–36.0)
MCV: 93 fL (ref 78.0–100.0)
Platelets: 88 10*3/uL — ABNORMAL LOW (ref 150–400)
RBC: 3.88 MIL/uL — ABNORMAL LOW (ref 4.22–5.81)
RDW: 14 % (ref 11.5–15.5)
WBC: 11.3 10*3/uL — ABNORMAL HIGH (ref 4.0–10.5)

## 2015-04-01 LAB — BASIC METABOLIC PANEL
Anion gap: 8 (ref 5–15)
BUN: 10 mg/dL (ref 6–20)
CO2: 26 mmol/L (ref 22–32)
Calcium: 8.3 mg/dL — ABNORMAL LOW (ref 8.9–10.3)
Chloride: 96 mmol/L — ABNORMAL LOW (ref 101–111)
Creatinine, Ser: 0.92 mg/dL (ref 0.61–1.24)
GFR calc Af Amer: >60 mL/min (ref >60)
GFR calc non Af Amer: >60 mL/min (ref >60)
Glucose, Bld: 148 mg/dL — ABNORMAL HIGH (ref 65–99)
Potassium: 4.1 mmol/L (ref 3.5–5.1)
Sodium: 130 mmol/L — ABNORMAL LOW (ref 135–145)

## 2015-04-01 MED ORDER — FUROSEMIDE 10 MG/ML IJ SOLN
20.0000 mg | Freq: Two times a day (BID) | INTRAMUSCULAR | Status: DC
  Administered 2015-04-01: 20 mg via INTRAVENOUS
  Filled 2015-04-01: qty 2

## 2015-04-01 MED ORDER — SODIUM CHLORIDE 0.9 % IV SOLN
250.0000 mL | INTRAVENOUS | Status: DC | PRN
Start: 2015-04-01 — End: 2015-04-06

## 2015-04-01 MED ORDER — AMIODARONE HCL IN DEXTROSE 360-4.14 MG/200ML-% IV SOLN
60.0000 mg/h | INTRAVENOUS | Status: AC
  Filled 2015-04-01 (×2): qty 200

## 2015-04-01 MED ORDER — AMIODARONE LOAD VIA INFUSION
150.0000 mg | Freq: Once | INTRAVENOUS | Status: AC
  Administered 2015-04-01: 150 mg via INTRAVENOUS
  Filled 2015-04-01: qty 83.34

## 2015-04-01 MED ORDER — MOVING RIGHT ALONG BOOK
Freq: Once | Status: AC
  Administered 2015-04-01: 19:00:00
  Filled 2015-04-01: qty 1

## 2015-04-01 MED ORDER — INSULIN ASPART 100 UNIT/ML ~~LOC~~ SOLN
0.0000 [IU] | Freq: Four times a day (QID) | SUBCUTANEOUS | Status: DC
  Administered 2015-04-01: 2 [IU] via SUBCUTANEOUS

## 2015-04-01 MED ORDER — POTASSIUM CHLORIDE CRYS ER 20 MEQ PO TBCR
20.0000 meq | EXTENDED_RELEASE_TABLET | Freq: Once | ORAL | Status: AC
  Administered 2015-04-01: 20 meq via ORAL
  Filled 2015-04-01: qty 1

## 2015-04-01 MED ORDER — AMIODARONE HCL 200 MG PO TABS
200.0000 mg | ORAL_TABLET | Freq: Two times a day (BID) | ORAL | Status: DC
  Administered 2015-04-01 – 2015-04-03 (×5): 200 mg via ORAL
  Filled 2015-04-01 (×6): qty 1

## 2015-04-01 MED ORDER — AMIODARONE HCL IN DEXTROSE 360-4.14 MG/200ML-% IV SOLN
30.0000 mg/h | INTRAVENOUS | Status: DC
Start: 2015-04-01 — End: 2015-04-01
  Filled 2015-04-01 (×2): qty 200

## 2015-04-01 MED ORDER — SODIUM CHLORIDE 0.9 % IJ SOLN: 3.0000 mL | INTRAMUSCULAR | Status: DC | PRN

## 2015-04-01 MED ORDER — SODIUM CHLORIDE 0.9 % IJ SOLN
3.0000 mL | Freq: Two times a day (BID) | INTRAMUSCULAR | Status: DC
  Administered 2015-04-02 – 2015-04-05 (×5): 3 mL via INTRAVENOUS

## 2015-04-01 NOTE — Care Management Note (Signed)
Case Management Note  Patient Details  Name: Troy Banks MRN: [id number] Date of Birth: 15-Apr-1941  Subjective/Objective:   Note initiated by Loletta Specter -  Pt admitted for chest pain. Plan for cardiac cath 03-26-15.               04-01-15 post op CABG x4 and AVR on 10-24.  Lives with wife Troy Banks who is an Therapist, sports.  Independent prior to admission.  Plan for DC home.  Post op with Afib and on amio drip.  No needs at this point.  Has PCP in [place] and with the [facility] in [place].  No problem getting medications.   Action/Plan: CM will continue to monitor for disposition needs.    Expected Discharge Date:                  Expected Discharge Plan:  Home/Self Care  In-House Referral:     Discharge planning Services  CM Consult  Post Acute Care Choice:    Choice offered to:     DME Arranged:    DME Agency:     HH Arranged:    HH Agency:     Status of Service:  In process, will continue to follow  Medicare Important Message Given:    Date Medicare IM Given:    Medicare IM give by:    Date Additional Medicare IM Given:    Additional Medicare Important Message give by:     If discussed at [place] of Stay Meetings, dates discussed:    Additional Comments: 1325 03-30-15 Jacqlyn Krauss, RN,BSN [phone] Severe CAD, NSTEMI- plan for Cabg 03-30-15. CM will continue to monitor for additional disposition needs.  Vergie Living, RN 04/01/2015, 10:44 AM

## 2015-04-01 NOTE — Progress Notes (Addendum)
Dr. Prescott Gum notified regarding patient going into atrial fib, rate of 90s-120s. BP 115/51, MAP 65. Patient currently in bed, resting calmly. Orders received to start amiodarone. RN spoke with pharmacist regarding amio and was given ok to administer drip to patient. RN will continue to monitor patient closely. [name] (current wife) has been updated.

## 2015-04-01 NOTE — Progress Notes (Addendum)
[facility] DAILY ICU PROGRESS NOTE                   [address]            [place] [address]          [phone]   2 Days Post-Op Procedure(s) (LRB): CORONARY ARTERY BYPASS GRAFTING (CABG) x four, using left internal mammary artery and left    leg greater saphenous vein harvested endoscopically  (N/A) AORTIC VALVE REPLACEMENT (AVR) (N/A) TRANSESOPHAGEAL ECHOCARDIOGRAM (TEE) (N/A)  Total Length of Stay:  LOS: 6 days   Subjective: Patient sitting in chair. Has productive cough and is able to expectorate  Objective: Vital signs in last 24 hours: Temp:  [98.5 F (36.9 C)-99.3 F (37.4 C)] 99.3 F (37.4 C) (10/26 0748) Pulse Rate:  [56-107] 83 (10/26 0800) Cardiac Rhythm:  [-] Atrial paced (10/26 0000) Resp:  [17-26] 23 (10/26 0800) BP: (99-132)/(51-76) 105/67 mmHg (10/26 0800) SpO2:  [92 %-100 %] 96 % (10/26 0800) Arterial Line BP: (160-188)/(67-89) 173/84 mmHg (10/25 1345) Weight:  [249 lb 1.9 oz (113 kg)] 249 lb 1.9 oz (113 kg) (10/26 0500)  Filed Weights   03/30/15 0513 03/31/15 0452 04/01/15 0500  Weight: 233 lb 7.1 oz (105.89 kg) 236 lb 6.4 oz (107.23 kg) 249 lb 1.9 oz (113 kg)   Weight change: 12 lb 11.5 oz (5.77 kg)     Intake/Output from previous day: 10/25 0701 - 10/26 0700 In: 1192.9 [P.O.:540; I.V.:552.9; IV Piggyback:100] Out: 1790 [Urine:1645; Chest Tube:145]  Intake/Output this shift: Total I/O In: 50.1 [I.V.:50.1] Out: 340 [Urine:200; Chest Tube:140]  Current Meds: Scheduled Meds: . acetaminophen  1,000 mg Oral 4 times per day   Or  . acetaminophen (TYLENOL) oral liquid 160 mg/5 mL  1,000 mg Per Tube 4 times per day  . allopurinol  100 mg Oral BID  . atenolol  12.5 mg Oral BID  . atorvastatin  20 mg Oral q1800  . bisacodyl  10 mg Oral Daily   Or  . bisacodyl  10 mg Rectal Daily  . cholecalciferol  2,000 Units Oral Daily  . docusate sodium  200 mg Oral Daily  . enoxaparin (LOVENOX) injection  40 mg Subcutaneous QHS  . insulin  aspart  0-24 Units Subcutaneous 6 times per day  . insulin detemir  15 Units Subcutaneous Daily  . primidone  100 mg Oral Daily   And  . primidone  50 mg Oral QHS  . ranitidine  150 mg Oral BID  . sodium chloride  3 mL Intravenous Q12H   Continuous Infusions: . sodium chloride Stopped (03/31/15 0840)  . sodium chloride    . sodium chloride Stopped (03/31/15 0840)  . amiodarone 30 mg/hr (04/01/15 0716)  . lactated ringers    . lactated ringers 10 mL/hr at 03/31/15 1900  . nitroGLYCERIN Stopped (03/31/15 0130)  . phenylephrine (NEO-SYNEPHRINE) Adult infusion Stopped (03/30/15 1817)   PRN Meds:.sodium chloride, HYDROmorphone, lactated ringers, morphine injection, ondansetron (ZOFRAN) IV, sodium chloride  General appearance: alert and cooperative Neurologic: intact Heart: RRR, intermittingly paced, no murmur Lungs: Diminshed at bases Abdomen: soft, non-tender; bowel sounds normal; no masses,  no organomegaly Extremities: Bilateral LE edema Wound: Aquacel intact;lower extremity wounds are clean and dry  Lab Results: CBC: Recent Labs  03/31/15 1540 04/01/15 0617  WBC 11.0* 11.3*  HGB 11.9* 11.7*  HCT 35.8* 36.1*  PLT 91* 88*   BMET:  Recent Labs  03/31/15 0427 03/31/15 1538 03/31/15 1540 04/01/15  0617  NA 134* 134*  --  130*  K 4.0 4.4  --  4.1  CL 104 101  --  96*  CO2 25  --   --  26  GLUCOSE 126* 223*  --  148*  BUN 10 12  --  10  CREATININE 1.03 0.90 1.03 0.92  CALCIUM 8.3*  --   --  8.3*    PT/INR:  Recent Labs  03/30/15 1555  LABPROT 17.9*  INR 1.47   Radiology: Dg Chest Port 1 View  04/01/2015  CLINICAL DATA:  Status post CABG on October twenty-fourth 2016 EXAM: PORTABLE CHEST 1 VIEW COMPARISON:  Portable chest x-ray of March 31, 2015 FINDINGS: The lungs are adequately inflated. There is persistent atelectasis and a small amount of pleural fluid on the left. The left-sided chest tube tip projects over the posterior aspect of the fifth rib. There is  no pneumothorax. The right lung is adequately inflated. There are coarse interstitial lung markings in the infrahilar region. The cardiac silhouette remains enlarged. The Swan-Ganz catheter is been removed. The right internal jugular Cordis sheath remains. There are 7 intact sternal wires. A prosthetic aortic valve cage is visible. IMPRESSION: 1. Ongoing improvement in left lower lobe atelectasis and small left pleural effusion. The left chest tube is unchanged in position there is no pneumothorax. 2. Stable enlargement cardiac silhouette without pulmonary edema. 3. Right lower lobe subsegmental atelectasis. Electronically Signed   By: David  [name] M.D.   On: 04/01/2015 08:09     Assessment/Plan: S/P Procedure(s) (LRB): CORONARY ARTERY BYPASS GRAFTING (CABG) x four, using left internal mammary artery and left    leg greater saphenous vein harvested endoscopically  (N/A) AORTIC VALVE REPLACEMENT (AVR) (N/A) TRANSESOPHAGEAL ECHOCARDIOGRAM (TEE) (N/A)  1. CV-S/p NSTEMI. Went into a fib with RVR early this am. Converted to SR around 8:55 am and remains in SR. On Amiodarone drip and Atenolol 12.5 mg bid. Per discussion with pharmacy, could use low dose oral i.e. 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 2. Pulmonary-On room air. CXR this am shows cardiomegaly, no pneumothorax, small left pleural effusion and bibasilar atelectasis. Left chest tube with 145 cc for 24 hours. Has had 140 cc since 7 am. Likely remove chest tube in am. Encourage incentive spirometer and flutter valve. 3. Volume overload-Lasix 20 mg IV bid today 4. ABL anemia- H and H stable at 11.7 and 36.1 5. Thrombocytopenia-platelets 88,000. On Lovenox 6. Mild hyponatremia-sodium is 130. 7. DM-CBGs 134/136/114. On Insulin. Pre op HGA1C 8. 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 Remove sleeve, foley,chest tube prior to transfer 10. Transfer to [facility]  [name] 04/01/2015 10:38 AM Brief episode of afib this am, now back in sinus.  reduced dose Cordarone To stepdown today Valve sounds crisp , no m Watch plt count  I have seen and examined [name] and agree with the above assessment  and plan.  Grace Isaac MD [facility] [phone] Office [phone] 04/01/2015 12:02 PM

## 2015-04-01 NOTE — Progress Notes (Signed)
CT surgery p.m. Rounds  Patient had a good day Ambulated in the hallway Currently on room air P.m. labs reviewed and are satisfactory Complaining of some incisional pain but appears to be comfortable after receiving dilaudid

## 2015-04-01 NOTE — Care Management Important Message (Signed)
Important Message  Patient Details  Name: Troy Banks MRN: [id number] Date of Birth: [birth year]   Medicare Important Message Given:  Yes-second notification given    Nathen May 04/01/2015, 1:18 PM

## 2015-04-02 ENCOUNTER — Inpatient Hospital Stay (HOSPITAL_COMMUNITY): Payer: Medicare Other

## 2015-04-02 LAB — CBC
HEMATOCRIT: 34.9 % — AB (ref 39.0–52.0)
HEMOGLOBIN: 11.8 g/dL — AB (ref 13.0–17.0)
MCH: 31.1 pg (ref 26.0–34.0)
MCHC: 33.8 g/dL (ref 30.0–36.0)
MCV: 92.1 fL (ref 78.0–100.0)
Platelets: 91 10*3/uL — ABNORMAL LOW (ref 150–400)
RBC: 3.79 MIL/uL — ABNORMAL LOW (ref 4.22–5.81)
RDW: 13.7 % (ref 11.5–15.5)
WBC: 10.8 10*3/uL — ABNORMAL HIGH (ref 4.0–10.5)

## 2015-04-02 LAB — TYPE AND SCREEN
ABO/RH(D): B POS
Antibody Screen: NEGATIVE
Unit division: 0
Unit division: 0

## 2015-04-02 LAB — BASIC METABOLIC PANEL
Anion gap: 6 (ref 5–15)
BUN: 12 mg/dL (ref 6–20)
CHLORIDE: 97 mmol/L — AB (ref 101–111)
CO2: 28 mmol/L (ref 22–32)
Calcium: 8.7 mg/dL — ABNORMAL LOW (ref 8.9–10.3)
Creatinine, Ser: 1 mg/dL (ref 0.61–1.24)
GFR calc Af Amer: >60 mL/min (ref >60)
GFR calc non Af Amer: >60 mL/min (ref >60)
Glucose, Bld: 178 mg/dL — ABNORMAL HIGH (ref 65–99)
Potassium: 4.4 mmol/L (ref 3.5–5.1)
Sodium: 131 mmol/L — ABNORMAL LOW (ref 135–145)

## 2015-04-02 LAB — GLUCOSE, CAPILLARY
GLUCOSE-CAPILLARY: 93 mg/dL (ref 65–99)
GLUCOSE-CAPILLARY: 98 mg/dL (ref 65–99)
Glucose-Capillary: 118 mg/dL — ABNORMAL HIGH (ref 65–99)
Glucose-Capillary: 100 mg/dL — ABNORMAL HIGH (ref 65–99)

## 2015-04-02 MED ORDER — FUROSEMIDE 40 MG PO TABS
40.0000 mg | ORAL_TABLET | Freq: Every day | ORAL | Status: DC
  Administered 2015-04-02 – 2015-04-04 (×3): 40 mg via ORAL
  Filled 2015-04-02 (×3): qty 1

## 2015-04-02 MED ORDER — LACTULOSE 10 GM/15ML PO SOLN
20.0000 g | Freq: Once | ORAL | Status: AC
  Administered 2015-04-02: 20 g via ORAL
  Filled 2015-04-02: qty 30

## 2015-04-02 MED ORDER — AMLODIPINE BESYLATE 5 MG PO TABS
5.0000 mg | ORAL_TABLET | Freq: Every day | ORAL | Status: DC
Start: 2015-04-02 — End: 2015-04-04
  Administered 2015-04-02 – 2015-04-03 (×2): 5 mg via ORAL
  Filled 2015-04-02 (×2): qty 1

## 2015-04-02 NOTE — Discharge Instructions (Signed)
Activity: 1.May walk up steps                2.No lifting more than ten pounds for four weeks.                 3.No driving for four weeks.                4.Stop any activity that causes chest pain, shortness of breath, dizziness, sweating or excessive weakness.                5.Avoid straining.                6.Continue with your breathing exercises daily.  Diet: Diabetic diet and Low fat, Low salt diet  Wound Care: May shower.  Clean wounds with mild soap and water daily. Contact the office at [phone] if any problems arise.      Coronary Artery Bypass Grafting, Care After Refer to this sheet in the next few weeks. These instructions provide you with information on caring for yourself after your procedure. Your health care provider may also give you more specific instructions. Your treatment has been planned according to current medical practices, but problems sometimes occur. Call your health care provider if you have any problems or questions after your procedure. WHAT TO EXPECT AFTER THE PROCEDURE Recovery from surgery will be different for everyone. Some people feel well after 3 or 4 weeks, while for others it takes longer. After your procedure, it is typical to have the following:  Nausea and a lack of appetite.   Constipation.  Weakness and fatigue.   Depression or irritability.   Pain or discomfort at your incision site. HOME CARE INSTRUCTIONS  Take medicines only as directed by your health care provider. Do not stop taking medicines or start any new medicines without first checking with your health care provider.  Take your pulse as directed by your health care provider.  Perform deep breathing as directed by your health care provider. If you were given a device called an incentive spirometer, use it to practice deep breathing several times a day. Support your chest with a pillow or your arms when you take deep breaths or cough.  Keep incision areas clean, dry,  and protected. Remove or change any bandages (dressings) only as directed by your health care provider. You may have skin adhesive strips over the incision areas. Do not take the strips off. They will fall off on their own.  Check incision areas daily for any swelling, redness, or drainage.  If incisions were made in your legs, do the following:  Avoid crossing your legs.   Avoid sitting for long periods of time. Change positions every 30 minutes.   Elevate your legs when you are sitting.  Wear compression stockings as directed by your health care provider. These stockings help keep blood clots from forming in your legs.  Take showers once your health care provider approves. Until then, only take sponge baths. Pat incisions dry. Do not rub incisions with a washcloth or towel. Do not take baths, swim, or use a hot tub until your health care provider approves.  Eat foods that are high in fiber, such as raw fruits and vegetables, whole grains, beans, and nuts. Meats should be lean cut. Avoid canned, processed, and fried foods.  Drink enough fluid to keep your urine clear or pale yellow.  Weigh yourself every day. This helps identify if you are retaining fluid that may make  your heart and lungs work harder.  Rest and limit activity as directed by your health care provider. You may be instructed to:  Stop any activity at once if you have chest pain, shortness of breath, irregular heartbeats, or dizziness. Get help right away if you have any of these symptoms.  Move around frequently for short periods or take short walks as directed by your health care provider. Increase your activities gradually. You may need physical therapy or cardiac rehabilitation to help strengthen your muscles and build your endurance.  Avoid lifting, pushing, or pulling anything heavier than 10 lb (4.5 kg) for at least 6 weeks after surgery.  Do not drive until your health care provider approves.  Ask your health  care provider when you may return to work.  Ask your health care provider when you may resume sexual activity.  Keep all follow-up visits as directed by your health care provider. This is important. SEEK MEDICAL CARE IF:  You have swelling, redness, increasing pain, or drainage at the site of an incision.  You have a fever.  You have swelling in your ankles or legs.  You have pain in your legs.   You gain 2 or more pounds (0.9 kg) a day.  You are nauseous or vomit.  You have diarrhea. SEEK IMMEDIATE MEDICAL CARE IF:  You have chest pain that goes to your jaw or arms.  You have shortness of breath.   You have a fast or irregular heartbeat.   You notice a "clicking" in your breastbone (sternum) when you move.   You have numbness or weakness in your arms or legs.  You feel dizzy or light-headed.  MAKE SURE YOU:  Understand these instructions.  Will watch your condition.  Will get help right away if you are not doing well or get worse.   This information is not intended to replace advice given to you by your health care provider. Make sure you discuss any questions you have with your health care provider.   Document Released: 12/10/2004 Document Revised: 06/13/2014 Document Reviewed: 10/30/2012 Elsevier Interactive Patient Education 2016 [employer].  Aortic Valve Replacement, Care After Refer to this sheet in the next few weeks. These instructions provide you with information on caring for yourself after your procedure. Your health care provider may also give you specific instructions. Your treatment has been planned according to current medical practices, but problems sometimes occur. Call your health care provider if you have any problems or questions after your procedure. HOME CARE INSTRUCTIONS   Take medicines only as directed by your health care provider.  If your health care provider has prescribed elastic stockings, wear them as directed.  Take  frequent naps or rest often throughout the day.  Avoid lifting over 10 lbs (4.5 kg) or pushing or pulling things with your arms for 6-8 weeks or as directed by your health care provider.  Avoid driving or airplane travel for 4-6 weeks after surgery or as directed by your health care provider. If you are riding in a car for an extended period, stop every 1-2 hours to stretch your legs. Keep a record of your medicines and medical history with you when traveling.  Do not drive or operate heavy machinery while taking pain medicine. (narcotics).  Do not cross your legs.  Do not use any tobacco products including cigarettes, chewing tobacco, or electronic cigarettes. If you need help quitting, ask your health care provider.  Do not take baths, swim, or use a  hot tub until your health care provider approves. Take showers once your health care provider approves. Pat incisions dry. Do not rub incisions with a washcloth or towel.  Avoid climbing stairs and using the handrail to pull yourself up for the first 2-3 weeks after surgery.  Return to work as directed by your health care provider.  Drink enough fluid to keep your urine clear or pale yellow.  Do not strain to have a bowel movement. Eat high-fiber foods if you become constipated. You may also take a medicine to help you have a bowel movement (laxative) as directed by your health care provider.  Resume sexual activity as directed by your health care provider. Men should not use medicines for erectile dysfunction until their doctor says it isokay.  If you had a certain type of heart condition in the past, you may need to take antibiotic medicine before having dental work or surgery. Let your dentist and health care providers know if you had one or more of the following:  Previous endocarditis.  An artificial (prosthetic) heart valve.  Congenital heart disease. SEEK MEDICAL CARE IF:  You develop a skin rash.   You experience sudden  changes in your weight.  You have a fever. SEEK IMMEDIATE MEDICAL CARE IF:   You develop chest pain that is not coming from your incision.  You have drainage (pus), redness, swelling, or pain at your incision site.   You develop shortness of breath or have difficulty breathing.   You have increased bleeding from your incision site.   You develop light-headedness.  MAKE SURE YOU:   Understand these directions.  Will watch your condition.  Will get help right away if you are not doing well or get worse.   This information is not intended to replace advice given to you by your health care provider. Make sure you discuss any questions you have with your health care provider.   Document Released: 12/09/2004 Document Revised: 06/13/2014 Document Reviewed: 03/06/2012 Elsevier Interactive Patient Education [employer].

## 2015-04-02 NOTE — Progress Notes (Signed)
RT went to check on pt. To make sure he was ok with his cpap machine. Pt. States he is ok. RT informed pt. To notify if he needed any assistance.

## 2015-04-02 NOTE — Discharge Summary (Signed)
Physician Discharge Summary       [address]       [place] [address]             [phone]    Patient ID: Troy Banks MRN: [id number] DOB/AGE: 1940/08/10 74 y.o.  Admit date: 03/25/2015 Discharge date: 04/06/2015  Admission Diagnoses: 1. S/p NSTEMI 2. Multivessel CAD 3. Moderate aortic stenosis 4. History of diet-controlled diabetes mellitus 5. History of essential hypertension 6. History of OSA on CPAP 7. History of GERD (gastroesophageal reflux disease) 8. 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 History of remote tobacco abuse 10. Abnormal TSH (7.951 on this admission)  Discharge Diagnoses:  1. S/p NSTEMI 2. Multivessel CAD 3. Moderate aortic stenosis 4. History of diet-controlled diabetes mellitus 5. History of essential hypertension 6. History of OSA on CPAP 7. History of GERD (gastroesophageal reflux disease) 8. 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 History of remote tobacco abuse 10. Abnormal TSH (7.951 on this admission) 11. ABL anemia 12.Thrombocytopenia   Procedure (s):  1. Cardiac catheterization done by Dr. Ellyn Hack on 10/20/2016Colon Flattery LAD to Prox LAD lesion, 45% stenosed.  Prox LAD to Mid LAD lesion, 80% stenosed.  1st Diag-1 lesion, 95% stenosed. 1st Diag-2 lesion, 55% stenosed.  Ost Cx lesion, 75% stenosed. Mid Cx lesion, 90% stenosed.  3rd OM, 60% stenosed.  Prox RCA lesion, 60% stenosed. Mid RCA-1 lesion, 99% stenosed. Mid RCA-2 lesion, 60% stenosed.  The left ventricular systolic function is normal.    The patient has severe diffuse disease. For the most part his best option is bypass surgery. He does seem to be reluctant to pursue CABG for fear of intraoperative complications. Thankfully he has preserved LV function with relatively normal LVEDP.  2. Coronary artery bypass grafting x4, with the left internal mammary to the left  anterior descending coronary artery, reverse saphenous vein graft to the intermediate coronary artery, reverse saphenous vein graft to the circumflex coronary artery, reverse saphenous vein graft to the distal right coronary artery, and aortic valve replacement with a pericardial tissue valve [employer], model #[id number], 23 mm, serial #[id number] and Endovein greater at the left, greater saphenous thigh and calf Endovein harvesting by Dr. Servando Snare on 03/30/2015.  History of Presenting Illness: The patient is a 74 year old male who presented to the emergency department with substernal chest pain which radiated to the left arm. Symptoms came on fairly suddenly and lasted for 45 minutes was described as crushing type pressure. He was sitting on his porch when he began end noted associated nausea, diaphoresis and shortness of breath. He has a history of diabetes mellitus, hyperlipidemia and hypertension. He was admitted for further evaluation and treatment due to the severity of these symptoms with a presumptive diagnosis of unstable angina. EKG did show bradycardia with diffuse ST depression. He did rule in for non-STEMI myocardial infarction. History also notes aortic stenosis of uncertain severity. He declined echocardiogram last year. Dr. Servando Snare discussed the need for coronary artery bypass grafting surgery as well as possible aortic valve replacement surgery. Potential risks, benefits, and conditions were discussed with the patient and he agreed to proceed with surgery. Pre operative carotid duplex showed no significant carotid artery stenosis bilaterally. ABI's were around 0.9 bilaterally. 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 Ultimately, patient agreed to have  heart surgery and underwent a CABG x 4 and AVR on 03/30/2015.     Brief Hospital Course:  The patient was extubated early the morning of surgery without difficulty. He remained afebrile and hemodynamically stable. Gordy Councilman, a line, chest tubes, and foley were removed early in the post operative course. Atenolol was started. Patient has p450 CYP2D6 variant so must be careful with certain drugs. He was volume over loaded and diuresed. He had ABL anemia. He did not require a post op transfusion. His last H and H was 11.8 and 34.9. He was weaned off the insulin drip. The patient's glucose remained well controlled. The patient's HGA1C pre op was 6.0. He has been diet controlled for years.  He also had thrombocytopenia-last platelet count was stable at 91,000. The patient was felt surgically stable for transfer from the ICU to PCTU for further convalescence on 04/01/2015. Atenolol was stopped because it was making him bradycardic. He remained paced until 10/29. External pacer was turned off and his heart rate was between 58-66. This was about his heart rate prior to sugery.He continues to progress with cardiac rehab. He was ambulating on room air. He has been tolerating a diet and has had a bowel movement. Epicardial pacing wires were removed on 10/30. Chest tube sutures will be removed prior to discharge. The patient is felt surgically stable for discharge today.   Latest Vital Signs: Blood pressure 136/64, pulse 73, temperature 98.6 F (37 C), temperature source Oral, resp. rate 18, height 5' 8"  (1.727 m), weight 228 lb 1.6 oz (103.465 kg), SpO2 98 %.  Physical Exam: Cardiovascular: RRR, no murmur Pulmonary: Slightly diminished at bases bilaterally; no rales, wheezes, or rhonchi. Abdomen: Soft, non tender, bowel sounds present. Extremities: Mild bilateral lower extremity edema. Wounds: LE wounds are clean and dry. No erythema or signs of infection. Aquacel removed and sternal wound is clean and  dry.  Discharge Condition:Stable and discharged to home.  Recent laboratory studies:  Lab Results  Component Value Date   WBC 7.6 04/04/2015   HGB 11.0* 04/04/2015   HCT 32.2* 04/04/2015   MCV 90.7 04/04/2015   PLT 147* 04/04/2015   Lab Results  Component Value Date   NA 131* 04/02/2015   K 4.4 04/02/2015   CL 97* 04/02/2015   CO2 28 04/02/2015   CREATININE 1.00 04/02/2015   GLUCOSE 178* 04/02/2015    Diagnostic Studies: Dg Chest 2 View  04/02/2015  CLINICAL DATA:  Status post CABG and AVR. EXAM: CHEST - 2 VIEW COMPARISON:  04/01/2015 FINDINGS: Left chest tube is been removed. No pneumothorax identified. There is improved aeration of the left lower lobe with decrease in pleural fluid. Some atelectasis remains in the left lower lobe. There is improved aeration of the right lung base. No edema. Cardiac and mediastinal contours are stable. IMPRESSION: 1. No pneumothorax after removal of left chest tube. 2. Improved aeration of both lower lungs with some residual left lower lobe atelectasis remaining. 3. Decrease in left pleural fluid volume.  No pulmonary edema. Electronically Signed   By: Aletta Edouard M.D.   On: 04/02/2015 08:15       Discharge Instructions    Amb Referral to Cardiac Rehabilitation    Complete by:  As directed   Diagnosis:   CABG Myocardial Infarction            Discharge Medications:   Medication List    STOP taking these medications        hydrALAZINE 50 MG  tablet  Commonly known as:  APRESOLINE     Ibuprofen-Famotidine 800-26.6 MG Tabs     nabumetone 500 MG tablet  Commonly known as:  RELAFEN      TAKE these medications        allopurinol 100 MG tablet  Commonly known as:  ZYLOPRIM  Take 1 tablet (100 mg total) by mouth 2 (two) times daily.     amiodarone 200 MG tablet  Commonly known as:  PACERONE  Take 1 tablet (200 mg total) by mouth daily.     amLODipine 10 MG tablet  Commonly known as:  NORVASC  Take 1 tablet (10 mg total) by  mouth daily.     atorvastatin 20 MG tablet  Commonly known as:  LIPITOR  Take 1 tablet (20 mg total) by mouth daily at 6 PM.     blood glucose meter kit and supplies Kit  Use monitor to check blood sugar twice a day. DX E11.09     furosemide 40 MG tablet  Commonly known as:  LASIX  Take 40 mg by mouth every other day.     glucose blood test strip  Commonly known as:  ONE TOUCH TEST STRIPS  Test up to two times daily     HYDROmorphone 2 MG tablet  Commonly known as:  DILAUDID  Take 0.5-1 tablets (1-2 mg total) by mouth every 4 (four) hours as needed for severe pain.     NON FORMULARY  Take 1 tablet by mouth 2 (two) times daily. tumeric     ONE TOUCH LANCETS Misc  Test up to two times daily     ONE TOUCH ULTRA 2 W/DEVICE Kit  Test up to two times daily     primidone 50 MG tablet  Commonly known as:  MYSOLINE  Take two tablets in the morning, 1 in the evening     ranitidine 150 MG tablet  Commonly known as:  ZANTAC  Take 1 tablet (150 mg total) by mouth 2 (two) times daily.     Vitamin D3 2000 UNITS Tabs  Take 2,000 Units by mouth daily.       The patient has been discharged on:   1.Beta Blocker:  Yes [   ]                              No   [ x  ]                              If No, reason:Bradycardia  2.Ace Inhibitor/ARB: Yes [   ]                                     No  [  x  ]                                     If No, reason:Has enzyme deficiency- p450 CYP2D6 variant  3.Statin:   Yes [   ]                  No  [ x  ]                  If No, reason:Has enzyme  deficiency- p450 CYP2D6 variant  4.Shela [place]:  Yes  [   ]                  No   [  x ]                  If No, reason:Has enzyme deficiency- p450 CYP2D6 variant  Follow Up Appointments: Follow-up Information    Follow up with Richardson Dopp, [name] On 04/20/2015.   Specialties:  Physician Assistant, Radiology, Interventional Cardiology   Why:  Appointment time is at 9:50 am   Contact information:    [address] [address] [phone]       Follow up with Grace Isaac, MD On 05/04/2015.   Specialty:  Cardiothoracic Surgery   Why:  PA/LAT CXR (to be taken at [facility] which is in the same building as Dr. Everrett Coombe office) on 05/04/2015 at 12:15 pm;Appointment time is at 1:00 pm   Contact information:   [address] [place] [address] [phone]       Signed: Lars Pinks MPA-C 04/06/2015, 8:18 AM

## 2015-04-02 NOTE — Progress Notes (Addendum)
      [address]       [place] [address]             [phone]        3 Days Post-Op Procedure(s) (LRB): CORONARY ARTERY BYPASS GRAFTING (CABG) x four, using left internal mammary artery and left    leg greater saphenous vein harvested endoscopically  (N/A) AORTIC VALVE REPLACEMENT (AVR) (N/A) TRANSESOPHAGEAL ECHOCARDIOGRAM (TEE) (N/A)  Subjective: Patient's only complaint is having a lot of gas but no bowel movement yet.  Objective: Vital signs in last 24 hours: Temp:  [98.2 F (36.8 C)-99.3 F (37.4 C)] 98.2 F (36.8 C) (10/27 0601) Pulse Rate:  [79-84] 81 (10/27 0601) Cardiac Rhythm:  [-] Atrial paced (10/26 1955) Resp:  [18-30] 20 (10/27 0601) BP: (105-157)/(67-78) 142/77 mmHg (10/27 0601) SpO2:  [93 %-99 %] 94 % (10/27 0601) Weight:  [234 lb 9.1 oz (106.4 kg)] 234 lb 9.1 oz (106.4 kg) (10/27 0610)  Pre op weight 106 kg Current Weight  04/02/15 234 lb 9.1 oz (106.4 kg)      Intake/Output from previous day: 10/26 0701 - 10/27 0700 In: 563.5 [P.O.:480; I.V.:83.5] Out: 2850 [Urine:2660; Chest Tube:190]   Physical Exam:  Cardiovascular: RRR, no murmur Pulmonary: Slightly diminished at bases bilaterally; no rales, wheezes, or rhonchi. Abdomen: Soft, non tender, bowel sounds present. Extremities: Mild bilateral lower extremity edema. Wounds: LE wounds are clean and dry.  No erythema or signs of infection. Aquacel removed and sternal wound is clean and dry.  Lab Results: CBC: Recent Labs  04/01/15 0617 04/02/15 0230  WBC 11.3* 10.8*  HGB 11.7* 11.8*  HCT 36.1* 34.9*  PLT 88* 91*   BMET:  Recent Labs  04/01/15 0617 04/02/15 0230  NA 130* 131*  K 4.1 4.4  CL 96* 97*  CO2 26 28  GLUCOSE 148* 178*  BUN 10 12  CREATININE 0.92 1.00  CALCIUM 8.3* 8.7*    PT/INR:  Lab Results  Component Value Date   INR 1.47 03/30/2015   INR 1.15 03/29/2015   INR 1.13 03/26/2015   ABG:  INR: Will add last result for INR, ABG once  components are confirmed Will add last 4 CBG results once components are confirmed  Assessment/Plan:  1. CV - Previous a fib.SR/Paced at 80 this am. On Amiodarone 200 mg bid and Atenolol 12.5 mg bid. Will restart low dose Norvasc for better BP control. 2.  Pulmonary - On room air. CXR this am appears to show no pneumothorax,bibasilar atelectasis with improvement on the left, and cardiomegaly. Encourage incentive spirometer and flutter valve. 3. Volume Overload - On Lasix 40 mg daily 4.  Acute blood loss anemia - H and H stable at 11.8 and 34.9 5. Mild thrombocytopenia-platelets slightly increased to 91,000 6. 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 DM-CBGs 119/150/118. Continue Insulin. Pre op HGA1C 6.   ZIMMERMAN,Troy Banks MPA-C 04/02/2015,7:33 AM  Pacer decreased to 70 Improving I have seen and examined Troy Banks and agree with the above assessment  and plan.  Grace Isaac MD Beeper [phone] Office [phone] 04/02/2015 9:05 AM

## 2015-04-02 NOTE — Progress Notes (Signed)
CARDIAC REHAB PHASE I   PRE:  Rate/Rhythm: 70 paced    BP: sitting 134/61    SaO2: 95 RA  MODE:  Ambulation: 550 ft   POST:  Rate/Rhythm: 70 paced    BP: sitting 168/71     SaO2: 94 RA  Pt moving well. Able to stand independently and walk with RW. Denied fatigue. HR maintained pacing throughout. Return to recliner. Encouraged more walking today and IS.  [phone]   Josephina Shih [place] CES, ACSM 04/02/2015 9:43 AM

## 2015-04-03 LAB — GLUCOSE, CAPILLARY
GLUCOSE-CAPILLARY: 105 mg/dL — AB (ref 65–99)
Glucose-Capillary: 107 mg/dL — ABNORMAL HIGH (ref 65–99)
Glucose-Capillary: 103 mg/dL — ABNORMAL HIGH (ref 65–99)

## 2015-04-03 MED ORDER — AMIODARONE HCL 200 MG PO TABS
200.0000 mg | ORAL_TABLET | Freq: Every day | ORAL | Status: DC
  Administered 2015-04-04 – 2015-04-06 (×3): 200 mg via ORAL
  Filled 2015-04-03 (×3): qty 1

## 2015-04-03 MED ORDER — INSULIN DETEMIR 100 UNIT/ML ~~LOC~~ SOLN
10.0000 [IU] | Freq: Every day | SUBCUTANEOUS | Status: DC
Start: 2015-04-03 — End: 2015-04-04
  Administered 2015-04-03: 10 [IU] via SUBCUTANEOUS
  Filled 2015-04-03 (×2): qty 0.1

## 2015-04-03 NOTE — Progress Notes (Signed)
Pt c/o chest pain that radiated to L arm. Rated it an 8/10 on a 1-10 scale. Pt previously given PO dilaudid. Pt placed on oxygen and vitals obtained. Vitals trended for next 30 minutes. At end of 30 minutes, pt stated he felt much better. Pt informed that if he got short of breath or the pain returned to call nurse right away. Call bell and phone within reach. Wife at bedside. Will continue to monitor.

## 2015-04-03 NOTE — Progress Notes (Signed)
Utilization review completed.  

## 2015-04-03 NOTE — Progress Notes (Signed)
CARDIAC REHAB PHASE I   PRE:  Rate/Rhythm: 64 paced    BP: sitting 112/59    SaO2: 94 RA  MODE:  Ambulation: 550 ft   POST:  Rate/Rhythm: 64 paced    BP: sitting 133/64     SaO2: 94 RA  Tolerated well. HR did not override pacer. Pt sts he has always had HR in 40s that did not increase with activity. C/o hip bursitis toward end. Discussed CRPII with pt and s.o. Requests his referral be sent to [facility]. Will send and f/u tomorrow.  [phone]   Josephina Shih Protivin CES, ACSM 04/03/2015 1:59 PM

## 2015-04-03 NOTE — Progress Notes (Addendum)
      Troy Banks       [place] [address]             [phone]        4 Days Post-Op Procedure(s) (LRB): CORONARY ARTERY BYPASS GRAFTING (CABG) x four, using left internal mammary artery and left    leg greater saphenous vein harvested endoscopically  (N/A) AORTIC VALVE REPLACEMENT (AVR) (N/A) TRANSESOPHAGEAL ECHOCARDIOGRAM (TEE) (N/A)  Subjective: Patient's only complaint is having a lot of gas but no bowel movement yet.  Objective: Vital signs in last 24 hours: Temp:  [97.7 F (36.5 C)-98.2 F (36.8 C)] 98.2 F (36.8 C) (10/28 0635) Pulse Rate:  [68-70] 70 (10/28 0635) Cardiac Rhythm:  [-] Atrial paced (10/27 2015) Resp:  [18] 18 (10/28 0635) BP: (116-135)/(58-76) 135/76 mmHg (10/28 0635) SpO2:  [94 %-95 %] 95 % (10/28 0635) Weight:  [230 lb (104.327 kg)] 230 lb (104.327 kg) (10/28 0635)  Pre op weight 106 kg Current Weight  04/03/15 230 lb (104.327 kg)      Intake/Output from previous day: 10/27 0701 - 10/28 0700 In: 723 [P.O.:720; I.V.:3] Out: -    Physical Exam:  Cardiovascular: RRR, no murmur Pulmonary: Slightly diminished at bases bilaterally; no rales, wheezes, or rhonchi. Abdomen: Soft, non tender, bowel sounds present. Extremities: Mild bilateral lower extremity edema. Wounds: LE wounds are clean and dry.  No erythema or signs of infection. Aquacel removed and sternal wound is clean and dry.  Lab Results: CBC:  Recent Labs  04/01/15 0617 04/02/15 0230  WBC 11.3* 10.8*  HGB 11.7* 11.8*  HCT 36.1* 34.9*  PLT 88* 91*   BMET:   Recent Labs  04/01/15 0617 04/02/15 0230  NA 130* 131*  K 4.1 4.4  CL 96* 97*  CO2 26 28  GLUCOSE 148* 178*  BUN 10 12  CREATININE 0.92 1.00  CALCIUM 8.3* 8.7*    PT/INR:  Lab Results  Component Value Date   INR 1.47 03/30/2015   INR 1.15 03/29/2015   INR 1.13 03/26/2015   ABG:  INR: Will add last result for INR, ABG once components are confirmed Will add last 4 CBG results once  components are confirmed  Assessment/Plan:  1. CV - Previous a fib.SR/Paced at 70 this am. On Amiodarone 200 mg bid and Atenolol 12.5 mg bid and Norvasc 5 mg daily. Will discuss with Dr. Servando Snare if should hold Atenolol as is still being externally paced. 2.  Pulmonary - On room air. CXR this am appears to show no pneumothorax,bibasilar atelectasis with improvement on the left, and cardiomegaly. Encourage incentive spirometer and flutter valve. 3. Volume Overload - On Lasix 40 mg daily 4.  Acute blood loss anemia - H and H stable at 11.8 and 34.9 5. Mild thrombocytopenia-platelets slightly increased to 91,000 6. 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 DM-CBGs 98/100/105. Continue Insulin. Pre op HGA1C 6. 8. Hopefully, home Sunday.  ZIMMERMAN,DONIELLE MPA-C 04/03/2015,7:24 AM   preop patient baseline rate in 40- 50 , He is not going to tolerqte betablocker will, d/c  Pacer raqte decreased , try off tomorrow and home sat or Sunday not on beta blocker  I have seen and examined Troy Banks and agree with the above assessment  and plan.  Grace Isaac MD Beeper [phone] Office [phone] 04/03/2015 3:17 PM

## 2015-04-03 NOTE — Procedures (Signed)
Pt placed on home cpap machine with home settings, RT will continue to monitor.

## 2015-04-03 NOTE — Care Management Important Message (Signed)
Important Message  Patient Details  Name: Troy Banks MRN: [id number] Date of Birth: 01-03-1941   Medicare Important Message Given:  Yes-third notification given    Nathen May 04/03/2015, 11:43 AM

## 2015-04-04 LAB — GLUCOSE, CAPILLARY
GLUCOSE-CAPILLARY: 104 mg/dL — AB (ref 65–99)
GLUCOSE-CAPILLARY: 83 mg/dL (ref 65–99)
GLUCOSE-CAPILLARY: 111 mg/dL — AB (ref 65–99)
Glucose-Capillary: 145 mg/dL — ABNORMAL HIGH (ref 65–99)
Glucose-Capillary: 96 mg/dL (ref 65–99)

## 2015-04-04 LAB — CBC
HEMATOCRIT: 32.2 % — AB (ref 39.0–52.0)
HEMOGLOBIN: 11 g/dL — AB (ref 13.0–17.0)
MCH: 31 pg (ref 26.0–34.0)
MCHC: 34.2 g/dL (ref 30.0–36.0)
MCV: 90.7 fL (ref 78.0–100.0)
Platelets: 147 10*3/uL — ABNORMAL LOW (ref 150–400)
RBC: 3.55 MIL/uL — ABNORMAL LOW (ref 4.22–5.81)
RDW: 13.5 % (ref 11.5–15.5)
WBC: 7.6 10*3/uL (ref 4.0–10.5)

## 2015-04-04 MED ORDER — AMLODIPINE BESYLATE 5 MG PO TABS: 5.0000 mg | ORAL_TABLET | Freq: Every day | ORAL | Status: DC

## 2015-04-04 MED ORDER — AMLODIPINE BESYLATE 10 MG PO TABS
10.0000 mg | ORAL_TABLET | Freq: Every day | ORAL | Status: DC
  Administered 2015-04-04: 10 mg via ORAL
  Filled 2015-04-04: qty 1

## 2015-04-04 NOTE — Progress Notes (Addendum)
      [address]       [place] [address]             [phone]        5 Days Post-Op Procedure(s) (LRB): CORONARY ARTERY BYPASS GRAFTING (CABG) x four, using left internal mammary artery and left    leg greater saphenous vein harvested endoscopically  (N/A) AORTIC VALVE REPLACEMENT (AVR) (N/A) TRANSESOPHAGEAL ECHOCARDIOGRAM (TEE) (N/A)  Subjective: Patient brushing his teeth at sink this am. No complaints.  Objective: Vital signs in last 24 hours: Temp:  [97.6 F (36.4 C)-99.1 F (37.3 C)] 98.2 F (36.8 C) (10/29 0521) Pulse Rate:  [63-72] 64 (10/29 0521) Cardiac Rhythm:  [-] Atrial paced (10/29 0748) Resp:  [18] 18 (10/29 0521) BP: (112-147)/(59-75) 139/68 mmHg (10/29 0521) SpO2:  [91 %-99 %] 98 % (10/29 0521) Weight:  [230 lb 6.1 oz (104.5 kg)] 230 lb 6.1 oz (104.5 kg) (10/29 0521)  Pre op weight 106 kg Current Weight  04/04/15 230 lb 6.1 oz (104.5 kg)      Intake/Output from previous day: 10/28 0701 - 10/29 0700 In: 840 [P.O.:840] Out: 1750 [Urine:1750]   Physical Exam:  Cardiovascular: SB/SR no murmur Pulmonary: Slightly diminished at bases bilaterally; no rales, wheezes, or rhonchi. Abdomen: Soft, non tender, bowel sounds present. Extremities:Trace bilateral lower extremity edema. Wounds:  No erythema or signs of infection.   Lab Results: CBC:  Recent Labs  04/02/15 0230 04/04/15 0254  WBC 10.8* 7.6  HGB 11.8* 11.0*  HCT 34.9* 32.2*  PLT 91* 147*   BMET:   Recent Labs  04/02/15 0230  NA 131*  K 4.4  CL 97*  CO2 28  GLUCOSE 178*  BUN 12  CREATININE 1.00  CALCIUM 8.7*    PT/INR:  Lab Results  Component Value Date   INR 1.47 03/30/2015   INR 1.15 03/29/2015   INR 1.13 03/26/2015   ABG:  INR: Will add last result for INR, ABG once components are confirmed Will add last 4 CBG results once components are confirmed  Assessment/Plan:  1. CV - Previous a fib.SB and paced this am. On Amiodarone 200 mg daily  and Norvasc 5 mg daily.  Underlying heart rate was in the 40's yesterday so Atenolol stopped. Will  stop external pacer and monitor for next 24 hours. 2.  Pulmonary - On room air. Encourage incentive spirometer and flutter valve. 3. Volume Overload - On Lasix 40 mg daily 4.  Acute blood loss anemia - H and H stable at 11 and 32.2 5. Mild thrombocytopenia-platelets  increased to 147,000 6. 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 DM-CBGs 103/145/104. Continue Insulin PRN. Pre op HGA1C 6. He is a diet controlled diabetic. 8. Hopefully, home in 1-2 days  ZIMMERMAN,DONIELLE MPA-C 04/04/2015,8:29 AM   I have seen and examined the patient and agree with the assessment and plan as outlined.  Maintaining NSR w/ pacer off.  Possible D/C home 1-2 days if rhythm stable.  Rexene Alberts, MD 04/04/2015 12:53 PM

## 2015-04-04 NOTE — Progress Notes (Signed)
External pacemaker off per PA. VSS. Patient Asymptomatic. Will continue to monitor.   [name] RN

## 2015-04-04 NOTE — Progress Notes (Signed)
Patient felt pressure in left eye when a black spot appeared across top of lens. Patient states this has happened in right eye before, but not left. Patient stated he sees opthomologist at the [facility] for both eyes regularly. At this time patient still having pressure, but vision clear. VSS. BP 138/70. HR 61 in NSR. MD made aware. Will continue to monitor.   [name] RN

## 2015-04-04 NOTE — Progress Notes (Signed)
CARDIAC REHAB PHASE I   PRE:  Rate/Rhythm: 61 NSR  BP:  Supine:   Sitting: 119/55  Standing:    SaO2: 96% RA  MODE:  Ambulation: 550 ft   POST:  Rate/Rhythm: 71 NSR  BP:  Supine:   Sitting: 145/63  Standing:    SaO2: 98% RA  [id number] Tolerated ambulation well with assist x1 and pushing rolling walker. OHS d/c education reviewed including sternal precautions, restrictions, daily weights, and activity progression. Heart healthy diet and exercise handout given. Pt verbalizes understanding of instructions given. Pt has been referred to Phase 2 cardiac rehab at [facility].   Sol Passer, MS, ACSM CCEP

## 2015-04-05 LAB — GLUCOSE, CAPILLARY
Glucose-Capillary: 97 mg/dL (ref 65–99)
Glucose-Capillary: 97 mg/dL (ref 65–99)
Glucose-Capillary: 125 mg/dL — ABNORMAL HIGH (ref 65–99)

## 2015-04-05 MED ORDER — HYDROMORPHONE HCL 2 MG PO TABS
1.0000 mg | ORAL_TABLET | ORAL | Status: DC | PRN
  Administered 2015-04-05 – 2015-04-06 (×3): 2 mg via ORAL
  Filled 2015-04-05 (×3): qty 1

## 2015-04-05 MED ORDER — AMLODIPINE BESYLATE 10 MG PO TABS
10.0000 mg | ORAL_TABLET | Freq: Every day | ORAL | Status: DC
  Administered 2015-04-05 – 2015-04-06 (×2): 10 mg via ORAL
  Filled 2015-04-05 (×2): qty 1

## 2015-04-05 NOTE — Progress Notes (Signed)
Patient requested to see nurse, upon entering room, patient stated " I don't where I am ", attempted to reorient patient unsuccessfully, states he is being held prisoner. Called patient's wife Troy Banks), she is now en route to hospital. Will continue to monitor patient at bedside. Patient appears fearful and asks if I am going to put him in restraints as "they did last time?" Told patient I see no need for restraints.

## 2015-04-05 NOTE — Progress Notes (Signed)
Patient has home CPAP machine set up at bedside.  Patient states he is able to place himself on/off as needed.

## 2015-04-05 NOTE — Progress Notes (Addendum)
      [address]       [place] [address]             [phone]        6 Days Post-Op Procedure(s) (LRB): CORONARY ARTERY BYPASS GRAFTING (CABG) x four, using left internal mammary artery and left    leg greater saphenous vein harvested endoscopically  (N/A) AORTIC VALVE REPLACEMENT (AVR) (N/A) TRANSESOPHAGEAL ECHOCARDIOGRAM (TEE) (N/A)  Subjective: Events yesterday and earlier this am noted.  Objective: Vital signs in last 24 hours: Temp:  [98.2 F (36.8 C)-98.6 F (37 C)] 98.6 F (37 C) (10/30 0409) Pulse Rate:  [58-63] 63 (10/30 0409) Cardiac Rhythm:  [-] Atrial paced (10/30 0736) Resp:  [18-19] 18 (10/30 0409) BP: (119-145)/(55-73) 145/73 mmHg (10/30 0409) SpO2:  [96 %] 96 % (10/30 0409) Weight:  [227 lb 6.4 oz (103.148 kg)] 227 lb 6.4 oz (103.148 kg) (10/30 0641)  Pre op weight 106 kg Current Weight  04/05/15 227 lb 6.4 oz (103.148 kg)      Intake/Output from previous day: 10/29 0701 - 10/30 0700 In: 600 [P.O.:600] Out: 400 [Urine:400]   Physical Exam:  Cardiovascular: SB/SR no murmur Pulmonary: Slightly diminished at bases bilaterally; no rales, wheezes, or rhonchi. Abdomen: Soft, non tender, bowel sounds present. Extremities: No lower extremity edema. Wounds:  Trace erythema skin edge lower sternal wound. No drainage noted. LE wounds are clean and dry. Neurologic: Grossly intact without focal deficits  Lab Results: CBC:  Recent Labs  04/04/15 0254  WBC 7.6  HGB 11.0*  HCT 32.2*  PLT 147*   BMET:  No results for input(s): NA, K, CL, CO2, GLUCOSE, BUN, CREATININE, CALCIUM in the last 72 hours.  PT/INR:  Lab Results  Component Value Date   INR 1.47 03/30/2015   INR 1.15 03/29/2015   INR 1.13 03/26/2015   ABG:  INR: Will add last result for INR, ABG once components are confirmed Will add last 4 CBG results once components are confirmed  Assessment/Plan:  1. CV - Previous a fib.!st degree AV block. External pacing  stopped yesterday. HR mostly in the 60's.On Amiodarone 200 mg daily and Norvasc 5 mg daily. Will increase Norvasc to 10 mg daily as take pre op for better BP control. 2.  Pulmonary - On room air. Encourage incentive spirometer and flutter valve. 3. Volume Overload - On Lasix 40 mg daily. Below pre op weight and no LE edema so will stop. 4.  Acute blood loss anemia - H and H stable at 11 and 32.2 5. Mild thrombocytopenia-last platelets up to 147,000 6. 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 DM-CBGs 111/96/97. Continue Insulin PRN. Pre op HGA1C 6. He is a diet controlled diabetic. 8. Patient states he has had a black spot across top of lens left eye yesterday afternoon. He has had in his right eye before. Sees MD at [facility]. Vision clear this am. No focal deficits on neuro exam. 9. Will likely discharge in am  ZIMMERMAN,DONIELLE MPA-C 04/05/2015,8:51 AM  I have seen and examined the patient and agree with the assessment and plan as outlined.  Rexene Alberts, MD 04/05/2015 1:33 PM

## 2015-04-05 NOTE — Progress Notes (Signed)
EPW removed. VSS. Patient tolerated procedure well. Tips of wires intact. Patient educated on bedrest for an hour with vitals Q15 minutes. Wife at the bedside. Call bell within reach.   [name] RN

## 2015-04-05 NOTE — Procedures (Signed)
Pt on home cpap machine with home settings, RT will continue to monitor.

## 2015-04-06 ENCOUNTER — Telehealth: Payer: Self-pay | Admitting: *Deleted

## 2015-04-06 LAB — GLUCOSE, CAPILLARY
GLUCOSE-CAPILLARY: 95 mg/dL (ref 65–99)
Glucose-Capillary: 121 mg/dL — ABNORMAL HIGH (ref 65–99)

## 2015-04-06 MED ORDER — AMIODARONE HCL 200 MG PO TABS: 200.0000 mg | ORAL_TABLET | Freq: Every day | ORAL | Status: DC

## 2015-04-06 MED ORDER — ATORVASTATIN CALCIUM 20 MG PO TABS: 20.0000 mg | ORAL_TABLET | Freq: Every day | ORAL | Status: DC

## 2015-04-06 MED ORDER — HYDROMORPHONE HCL 2 MG PO TABS: 1.0000 mg | ORAL_TABLET | ORAL | Status: DC | PRN

## 2015-04-06 NOTE — Care Management Note (Addendum)
Case Management Note  Patient Details  Name: Troy Banks MRN: [id number] Date of Birth: 1940/08/18  Subjective/Objective:   Note initiated by Loletta Specter -  Pt admitted for chest pain. Plan for cardiac cath 03-26-15.               04-01-15 post op CABG x4 and AVR on 10-24.  Lives with wife [name] who is an Therapist, sports.  Independent prior to admission.  Plan for DC home.  Post op with Afib and on amio drip.  No needs at this point.  Has PCP in [place] and with the [place] in [place].  No problem getting medications.   Action/Plan: CM will continue to monitor for disposition needs.    Expected Discharge Date:       04/06/15           Expected Discharge Plan:  Home/Self Care  In-House Referral:     Discharge planning Services  CM Consult  Post Acute Care Choice:    Choice offered to:     DME Arranged:    DME Agency:     HH Arranged:    HH Agency:     Status of Service:  Completed, signed off  Medicare Important Message Given:  Yes-third notification given Date Medicare IM Given:    Medicare IM give by:    Date Additional Medicare IM Given:    Additional Medicare Important Message give by:     If discussed at [place] of Stay Meetings, dates discussed:    Additional Comments: 1325 03-30-15 Jacqlyn Krauss, RN,BSN [phone] Severe CAD, NSTEMI- plan for Cabg 03-30-15. CM will continue to monitor for additional disposition needs.   10/31/16Marvetta Gibbons RN, BSN - pt s/p CABG- for d/c today - no d/c needs- will return home with spouse.   Dawayne Patricia, RN 04/06/2015, 9:52 AM

## 2015-04-06 NOTE — Progress Notes (Signed)
Utilization review completed.  

## 2015-04-06 NOTE — Progress Notes (Signed)
Pt chest tube sutures removed. Steri-strips applied.

## 2015-04-06 NOTE — Progress Notes (Signed)
      [address]       [place] [address]             [phone]        7 Days Post-Op Procedure(s) (LRB): CORONARY ARTERY BYPASS GRAFTING (CABG) x four, using left internal mammary artery and left    leg greater saphenous vein harvested endoscopically  (N/A) AORTIC VALVE REPLACEMENT (AVR) (N/A) TRANSESOPHAGEAL ECHOCARDIOGRAM (TEE) (N/A)  Subjective: Patient eating breakfast and wants to go home.  Objective: Vital signs in last 24 hours: Temp:  [98.6 F (37 C)-99.1 F (37.3 C)] 98.6 F (37 C) (10/31 0610) Pulse Rate:  [69-73] 73 (10/31 0610) Cardiac Rhythm:  [-] Heart block (10/30 2022) Resp:  [18] 18 (10/31 0610) BP: (108-136)/(62-92) 136/64 mmHg (10/31 0610) SpO2:  [98 %] 98 % (10/31 0610) Weight:  [228 lb 1.6 oz (103.465 kg)] 228 lb 1.6 oz (103.465 kg) (10/31 0540)  Pre op weight 106 kg Current Weight  04/06/15 228 lb 1.6 oz (103.465 kg)      Intake/Output from previous day: 10/30 0701 - 10/31 0700 In: 720 [P.O.:720] Out: -    Physical Exam:  Cardiovascular: SR, no murmur Pulmonary: Clear bilaterally Abdomen: Soft, non tender, bowel sounds present. Extremities: No lower extremity edema. Wounds:  Trace sero sanguinous drainage lower sternal wound. LE wounds are clean and dry. Neurologic: Grossly intact without focal deficits  Lab Results: CBC:  Recent Labs  04/04/15 0254  WBC 7.6  HGB 11.0*  HCT 32.2*  PLT 147*   BMET:  No results for input(s): NA, K, CL, CO2, GLUCOSE, BUN, CREATININE, CALCIUM in the last 72 hours.  PT/INR:  Lab Results  Component Value Date   INR 1.47 03/30/2015   INR 1.15 03/29/2015   INR 1.13 03/26/2015   ABG:  INR: Will add last result for INR, ABG once components are confirmed Will add last 4 CBG results once components are confirmed  Assessment/Plan:  1. CV - Previous a fib.1st degree AV block. External pacing stopped yesterday. HR mostly in the 60's.On Amiodarone 200 mg daily and Norvasc 10 mg  daily.  2.  Pulmonary - On room air. Encourage incentive spirometer and flutter valve. 3.  Acute blood loss anemia - H and H stable at 11 and 32.2 4. Mild thrombocytopenia-last platelets up to 147,000 5. 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 DM-CBGs 125/95/121. Continue Insulin PRN. Pre op HGA1C 6. He is a diet controlled diabetic. 7. Will discharge  ZIMMERMAN,DONIELLE MPA-C 04/06/2015,7:58 AM

## 2015-04-06 NOTE — Telephone Encounter (Signed)
Pt was on TCM list had a Cardiac Catherization was d/c 04/06/15. Pt will be following up with cardiology...[name]

## 2015-04-08 ENCOUNTER — Telehealth: Payer: Self-pay | Admitting: Cardiology

## 2015-04-08 NOTE — Telephone Encounter (Signed)
Thanks for update. Will await wife's call in am. Troy Banks

## 2015-04-08 NOTE — Telephone Encounter (Signed)
   Wife, [name], cardiac nurse, noted her husband irregular pulse this evening. No symptoms, no SOB, no CP. Feels a little off". No fevers.   On amiodarone, no Bb (CYP inhib gene defect)  Instructed to take an extra amiodarone 200mg  now.  Rest. Hopefully, as he did in hospital with post op AFIB, he will convert soon.   If symptoms arise, he knows to go to ER.  If AFIB remains, would need to consider anticoagulation and perhaps early cardioversion if necessary. (<48hrs).   Wife will notify office tomorrow of status.   Candee Furbish, MD

## 2015-04-09 ENCOUNTER — Other Ambulatory Visit: Payer: Self-pay | Admitting: Internal Medicine

## 2015-04-11 ENCOUNTER — Telehealth: Payer: Self-pay

## 2015-04-11 NOTE — Telephone Encounter (Signed)
PA started and approved  

## 2015-04-19 NOTE — Progress Notes (Signed)
Cardiology Office Note   Date:  04/20/2015   ID:  Troy Banks, DOB Oct 27, 1940, MRN [id number]   Patient Care Team: Rowe Clack, MD as PCP - General (Internal Medicine) [name], MD as Consulting Physician (Cardiology)    Chief Complaint  Patient presents with  . Hospitalization Follow-up    NSTEMI >> s/p CABG + AVR  . Coronary Artery Disease  . Aortic Stenosis     History of Present Illness: Troy Banks is a 74 y.o. male with a hx of sinus bradycardia, labile HTN and white coat HTN, hyperkalemia in the setting of ACE inhibitor use, OSA on CPAP, aortic stenosis, DM2.  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  Initially seen by Dr. Darlin Coco in 7/15 for bradycardia.  Echo was recommended to evaluate AS, but patient declined.    Admitted 10/19-10/31 with a non-STEMI. LHC demonstrated 3 vessel CAD. He was evaluated by Dr. Servando Snare and underwent CABG with LIMA-LAD, SVG-intermediate, SVG-LCx, SVG-distal RCA and bioprosthetic AVR.  Atenolol was initiated but later stopped secondary to bradycardia. He did develop atrial fibrillation. He was placed on amiodarone with return to NSR. Since discharge, the patient called in with complaints of irregular pulse. He was asked to take 1 extra dose of amiodarone.  Returns for follow-up. Here with his wife. She is retired Quarry manager. The patient's blood pressures at home have been optimal. Chest soreness is improved. He denies fevers or chills. Denies significant dyspnea. Denies orthopnea, PND or edema. Denies syncope. He does have some leg pain from his vein harvest site on the left. This is his most significant complaint. Denies further palpitations. Appetite is okay. He feels that his taste sensation is abnormal. He plans to start cardiac rehabilitation. He has not yet been contacted.   Studies/Reports Reviewed Today:  Carotid US 03/28/15 Bilateral:  1-39% ICA stenosis  LHC 03/26/15  Ost LAD to Prox LAD lesion, 45% stenosed.  Prox LAD to Mid LAD lesion, 80% stenosed.  1st Diag-1 lesion, 95% stenosed. 1st Diag-2 lesion, 55% stenosed.  Ost Cx lesion, 75% stenosed. Mid Cx lesion, 90% stenosed.  3rd OM, 60% stenosed.  Prox RCA lesion, 60% stenosed. Mid RCA-1 lesion, 99% stenosed. Mid RCA-2 lesion, 60% stenosed.  The left ventricular systolic function is normal. The patient has severe diffuse disease. For the most part his best option is bypass surgery. He does seem to be reluctant to pursue CABG for fear of intraoperative complications. Thankfully he has preserved LV function with relatively normal LVEDP.  Echo 03/26/15 Moderate LVH, EF 60-65%, moderate AS (mean 21 mmHg, peak 44 mmHg), MAC, mild MR, mild BAE   Past Medical History  Diagnosis Date  . COLONIC POLYPS, HX OF   . DIABETES MELLITUS     diet controlled  . DIVERTICULOSIS, COLON   . GOUT   . NEPHROLITHIASIS, HX OF     remote open stone-retrieval on L  . HYPERTENSION     off ACEI 2010 because of hyperkalemia in setting of ARI  . OSA on CPAP   . Cervical stenosis of spinal canal     fusion 2006  . Familial tremor 03/19/2014  . 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 Aortic stenosis, moderate 03/27/2015    Past Surgical History  Procedure Laterality Date  . Strangulated testicle  1960  . Left great toe Left  x2- 1985 & 2004  . Anterior fusion cervical spine  2002  . Hernia repair  1962  . Kidney stones  1986  . Posterior fusion cervical spine  2006  . Cardiac catheterization N/A 03/26/2015    Procedure: Left Heart Cath and Coronary Angiography;  Surgeon: Leonie Man, MD;  Location: [facility];  Service: Cardiovascular;  Laterality: N/A;  . Coronary artery bypass graft N/A 03/30/2015    Procedure: CORONARY ARTERY BYPASS GRAFTING (CABG) x four, using left internal mammary artery and  left    leg greater saphenous vein harvested endoscopically ;  Surgeon: Grace Isaac, MD;  Location: [facility];  Service: Open Heart Surgery;  Laterality: N/A;  . Aortic valve replacement N/A 03/30/2015    Procedure: AORTIC VALVE REPLACEMENT (AVR);  Surgeon: Grace Isaac, MD;  Location: [facility];  Service: Open Heart Surgery;  Laterality: N/A;  . Tee without cardioversion N/A 03/30/2015    Procedure: TRANSESOPHAGEAL ECHOCARDIOGRAM (TEE);  Surgeon: Grace Isaac, MD;  Location: [facility];  Service: Open Heart Surgery;  Laterality: N/A;     Current Outpatient Prescriptions  Medication Sig Dispense Refill  . allopurinol (ZYLOPRIM) 100 MG tablet Take 1 tablet (100 mg total) by mouth 2 (two) times daily. 180 tablet 3  . amiodarone (PACERONE) 200 MG tablet Take 1 tablet (200 mg total) by mouth daily. 30 tablet 1  . amLODipine (NORVASC) 10 MG tablet Take 1 tablet (10 mg total) by mouth daily. 90 tablet 1  . aspirin 81 MG tablet Take 81 mg by mouth daily.    . blood glucose meter kit and supplies KIT Use monitor to check blood sugar twice a day. DX E11.09 1 each 0  . Blood Glucose Monitoring Suppl (ONE TOUCH ULTRA 2) W/DEVICE KIT Test up to two times daily 1 each 1  . Cholecalciferol (VITAMIN D3) 2000 UNITS TABS Take 2,000 Units by mouth daily.    . furosemide (LASIX) 40 MG tablet Take 40 mg by mouth every other day.     [name] glucose blood (ONE TOUCH TEST STRIPS) test strip Test up to two times daily 100 each 12  . HYDROmorphone (DILAUDID) 2 MG tablet Take 0.5-1 tablets (1-2 mg total) by mouth every 4 (four) hours as needed for severe pain. 30 tablet 0  . nabumetone (RELAFEN) 500 MG tablet Take 1 tablet (500 mg total) by mouth 2 (two) times daily as needed for moderate pain. 180 tablet 3  . NON FORMULARY Take 1 tablet by mouth 2 (two) times daily. tumeric    . ONE TOUCH LANCETS MISC Test up to two times daily 100 each 11  . primidone (MYSOLINE) 50 MG tablet Take two tablets in the morning, 1 in the  evening 270 tablet 1  . ranitidine (ZANTAC) 150 MG tablet Take 1 tablet (150 mg total) by mouth 2 (two) times daily. 180 tablet 3   No current facility-administered medications for this visit.    Allergies:   Acyclovir and related; Benzodiazepines; Coreg; Diazepam; Lisinopril; Mobic; Sertraline hcl; and Tramadol    Social History:   Social History   Social History  . Marital Status: Divorced    Spouse Name: N/A  . Number of Children: N/A  . Years of Education: N/A   Occupational History  . Retired Animal nutritionist    Social History Main Topics  . Smoking status: Former Smoker    Quit date: 06/06/1990  . Smokeless tobacco: None     Comment: widowed  68 - lives with [place] who is Therapist, sports  . Alcohol Use: Yes     Comment: glass of wine once a week  . Drug Use: No  . Sexual Activity: Not Asked   Other Topics Concern  . None   Social History Narrative     Family History:   Family History  Problem Relation Age of Onset  . Heart disease Mother   . Hypertension Mother   . Heart disease Father   . Hypertension Father   . Prostate cancer Maternal Uncle   . Hypertension Maternal Grandmother   . Heart disease Maternal Grandmother   . Hypertension Maternal Grandfather   . Heart disease Maternal Grandfather   . Hypertension Paternal Grandfather   . Heart disease Paternal Grandfather   . Diabetes Paternal Grandfather   . Alcohol abuse Other   . Arthritis Other   . Diabetes Mother       ROS:   Please see the history of present illness.   Review of Systems  Constitution: Positive for decreased appetite.  Cardiovascular: Positive for chest pain.  Respiratory: Positive for shortness of breath.   All other systems reviewed and are negative.     PHYSICAL EXAM: VS:  BP 140/72 mmHg  Pulse 73  Ht 5' 8"  (1.727 m)  Wt 221 lb (100.245 kg)  BMI 33.61 kg/m2    Wt Readings from Last 3 Encounters:  04/20/15 221 lb (100.245 kg)  04/06/15 228 lb 1.6 oz (103.465 kg)    03/17/15 239 lb (108.41 kg)     GEN: Well nourished, well developed, in no acute distress HEENT: normal Neck: no JVD,   no masses Cardiac:  Normal E5/U3, RRR; 1/6 systolic murmur RUSB,  no rubs or gallops, no edema   Chest:  Median sternotomy well healed without erythema or d/c Respiratory:  clear to auscultation bilaterally, no wheezing, rhonchi or rales. GI: soft, nontender, nondistended, + BS MS: no deformity or atrophy Skin: warm and dry  Neuro:  CNs II-XII intact, Strength and sensation are intact Psych: Normal affect   EKG:  EKG is ordered today.  It demonstrates:   NSR, HR 73, normal axis, T-wave inversions in 2, 3, aVF, V5-V6, QTc 440 ms   Recent Labs: 03/26/2015: TSH 7.951* 03/29/2015: ALT 30 03/31/2015: Magnesium 1.9 04/02/2015: BUN 12; Creatinine, Ser 1.00; Potassium 4.4; Sodium 131* 04/04/2015: Hemoglobin 11.0*; Platelets 147*    Lipid Panel    Component Value Date/Time   CHOL 184 03/26/2015 0343   TRIG 144 03/26/2015 0343   HDL 36* 03/26/2015 0343   CHOLHDL 5.1 03/26/2015 0343   VLDL 29 03/26/2015 0343   LDLCALC 119* 03/26/2015 0343   LDLDIRECT 100.0 09/08/2014 0914      ASSESSMENT AND PLAN:  1. CAD:  S/p recent NSTEMI and subsequent CABG + AVR.  He is progressing well.  He plans to start Cardiac Rehab after seeing Dr. Servando Snare. He is not on ASA for unclear reasons. Reviewed with pharmacy.  There is no metabolism in the CYP2D6 pathway.  Start ASA 81 mg QD.  We should consider ASA + Plavix given ACS and subsequent CABG.  I will ask Dr. [name] and Dr. Servando Snare to weigh in on this as well.  Of note, Plavix is metabolized primarily in the 2C19 pathway. I would assume that this should be ok.  He stopped statin Rx given myalgias. This may have potentially been caused due to his genetic variant.  Will send to Lipid Clinic to help with management.  He has leg pain from his vein harvest site.  I have asked him to review this with [facility].  2. Aortic  Stenosis:  S/p bioprosthetic AVR at the time of his CABG.  Continue SBE prophylaxis. Arrange FU echo.     3. HTN:  Fair control. Continue to monitor.    4. Hyperlipidemia:  As noted he stopped Lipitor 20 due to myalgias. Refer to Lipid Clinic.   5. Carotid Stenosis:  Bilateral ICA 1-39% stenosis by pre-CABG dopplers.  Repeat carotid US in 1 year.     6. PAF:  Post op AFib.  He converted to NSR on Amiodarone. Maintaining NSR.  Will likely DC this in the next 2-3 mos if he remains in NSR.     7. 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 His HR is normal off beta-blocker therapy.   8. OSA:  Continue CPAP.        Medication Changes: Current medicines are reviewed at length with the patient today.  Concerns regarding medicines are as outlined above.  The following changes have been made:   Discontinued Medications   ATORVASTATIN (LIPITOR) 20 MG TABLET    Take 1 tablet (20 mg total) by mouth daily at 6 PM.   HYDRALAZINE (APRESOLINE) 50 MG TABLET    Take 50 mg by mouth 2 (two) times daily.   Modified Medications   No medications on file   New Prescriptions   No medications on file   Labs/ tests ordered today include:   Orders Placed This Encounter  Procedures  . US Carotid Duplex Bilateral  . EKG 12-Lead  . Echocardiogram     Disposition:    As Dr. Darlin Coco is set to retire in the next 6 mos, the patient requests FU with Dr. [name]. FU with Dr. [name] in 6-8 weeks.     Signed, Versie Starks, MHS 04/20/2015 11:12 AM    [facility] [address], [place], [place]  [address] Phone: [phone]; Fax: [phone]

## 2015-04-20 ENCOUNTER — Encounter: Payer: Self-pay | Admitting: Physician Assistant

## 2015-04-20 ENCOUNTER — Ambulatory Visit (INDEPENDENT_AMBULATORY_CARE_PROVIDER_SITE_OTHER): Payer: Medicare Other | Admitting: Physician Assistant

## 2015-04-20 VITALS — BP 140/72 | HR 73 | Ht 68.0 in | Wt 221.0 lb

## 2015-04-20 DIAGNOSIS — Z789 Other specified health status: Secondary | ICD-10-CM

## 2015-04-20 DIAGNOSIS — G4733 Obstructive sleep apnea (adult) (pediatric): Secondary | ICD-10-CM

## 2015-04-20 DIAGNOSIS — R001 Bradycardia, unspecified: Secondary | ICD-10-CM

## 2015-04-20 DIAGNOSIS — I249 Acute ischemic heart disease, unspecified: Secondary | ICD-10-CM

## 2015-04-20 DIAGNOSIS — I10 Essential (primary) hypertension: Secondary | ICD-10-CM

## 2015-04-20 DIAGNOSIS — I2510 Atherosclerotic heart disease of native coronary artery without angina pectoris: Secondary | ICD-10-CM | POA: Diagnosis not present

## 2015-04-20 DIAGNOSIS — Z954 Presence of other heart-valve replacement: Secondary | ICD-10-CM

## 2015-04-20 DIAGNOSIS — E8889 Other specified metabolic disorders: Secondary | ICD-10-CM

## 2015-04-20 DIAGNOSIS — E785 Hyperlipidemia, unspecified: Secondary | ICD-10-CM

## 2015-04-20 DIAGNOSIS — I350 Nonrheumatic aortic (valve) stenosis: Secondary | ICD-10-CM

## 2015-04-20 DIAGNOSIS — I480 Paroxysmal atrial fibrillation: Secondary | ICD-10-CM

## 2015-04-20 DIAGNOSIS — Z951 Presence of aortocoronary bypass graft: Secondary | ICD-10-CM

## 2015-04-20 DIAGNOSIS — Z952 Presence of prosthetic heart valve: Secondary | ICD-10-CM

## 2015-04-20 DIAGNOSIS — I6523 Occlusion and stenosis of bilateral carotid arteries: Secondary | ICD-10-CM

## 2015-04-20 NOTE — Patient Instructions (Addendum)
Medication Instructions:   START TAKING A BABY ASPIRIN 81 MG ONCE A DAY      If you need a refill on your cardiac medications before your next appointment, please call your pharmacy.  Labwork: NONE ORDER TODAY    Testing/Procedures:  Your physician has requested that you have an echocardiogram. Echocardiography is a painless test that uses sound waves to create images of your heart. It provides your doctor with information about the size and shape of your heart and how well your heart's chambers and valves are working. This procedure takes approximately one hour. There are no restrictions for this procedure.   Your physician has requested that you have a carotid duplex. IN ONE YEAR  This test is an ultrasound of the carotid arteries in your neck. It looks at blood flow through these arteries that supply the brain with blood. Allow one hour for this exam. There are no restrictions or special instructions.      Follow-Up:  IN 6 TO 8 WEEKS WITH DR [name]   REFERRAL TO LIPID CLINIC FOR CAD HYPERLIPIDEMIA  Any Other Special Instructions Will Be Listed Below (If Applicable).

## 2015-04-22 ENCOUNTER — Telehealth: Payer: Self-pay | Admitting: Physician Assistant

## 2015-04-22 DIAGNOSIS — I252 Old myocardial infarction: Secondary | ICD-10-CM

## 2015-04-22 MED ORDER — CLOPIDOGREL BISULFATE 75 MG PO TABS: 75.0000 mg | ORAL_TABLET | Freq: Every day | ORAL | Status: DC

## 2015-04-22 NOTE — Telephone Encounter (Signed)
Pt aware to start Plavix 75 mg a day and that the RX has been sent into his pharmacy.  He was instructed to continue ASA 81 mg daily and f/u as scheduled.

## 2015-04-22 NOTE — Telephone Encounter (Signed)
Please tell patient I reviewed his case with Dr. [name]. He should be on Plavix as well. Please start Plavix 75 mg QD. Continue ASA 81 mg QD as well. I have sent Plavix Rx sent to his pharmacy. Richardson Dopp, [name]   04/22/2015 8:14 AM

## 2015-04-24 ENCOUNTER — Other Ambulatory Visit: Payer: Self-pay

## 2015-04-24 ENCOUNTER — Ambulatory Visit (HOSPITAL_COMMUNITY): Payer: Medicare Other | Attending: Cardiology

## 2015-04-24 ENCOUNTER — Encounter: Payer: Self-pay | Admitting: Physician Assistant

## 2015-04-24 ENCOUNTER — Telehealth: Payer: Self-pay | Admitting: *Deleted

## 2015-04-24 ENCOUNTER — Ambulatory Visit (INDEPENDENT_AMBULATORY_CARE_PROVIDER_SITE_OTHER): Payer: Medicare Other | Admitting: Pharmacist

## 2015-04-24 DIAGNOSIS — I517 Cardiomegaly: Secondary | ICD-10-CM | POA: Diagnosis not present

## 2015-04-24 DIAGNOSIS — Z954 Presence of other heart-valve replacement: Secondary | ICD-10-CM

## 2015-04-24 DIAGNOSIS — I5189 Other ill-defined heart diseases: Secondary | ICD-10-CM | POA: Diagnosis not present

## 2015-04-24 DIAGNOSIS — I340 Nonrheumatic mitral (valve) insufficiency: Secondary | ICD-10-CM | POA: Insufficient documentation

## 2015-04-24 DIAGNOSIS — I10 Essential (primary) hypertension: Secondary | ICD-10-CM | POA: Diagnosis not present

## 2015-04-24 DIAGNOSIS — Z9889 Other specified postprocedural states: Secondary | ICD-10-CM | POA: Diagnosis not present

## 2015-04-24 DIAGNOSIS — I253 Aneurysm of heart: Secondary | ICD-10-CM | POA: Insufficient documentation

## 2015-04-24 DIAGNOSIS — Z952 Presence of prosthetic heart valve: Secondary | ICD-10-CM

## 2015-04-24 DIAGNOSIS — I313 Pericardial effusion (noninflammatory): Secondary | ICD-10-CM | POA: Insufficient documentation

## 2015-04-24 DIAGNOSIS — I249 Acute ischemic heart disease, unspecified: Secondary | ICD-10-CM | POA: Diagnosis not present

## 2015-04-24 DIAGNOSIS — E785 Hyperlipidemia, unspecified: Secondary | ICD-10-CM

## 2015-04-24 DIAGNOSIS — I2510 Atherosclerotic heart disease of native coronary artery without angina pectoris: Secondary | ICD-10-CM

## 2015-04-24 DIAGNOSIS — E669 Obesity, unspecified: Secondary | ICD-10-CM | POA: Diagnosis not present

## 2015-04-24 DIAGNOSIS — Z6833 Body mass index (BMI) 33.0-33.9, adult: Secondary | ICD-10-CM | POA: Insufficient documentation

## 2015-04-24 DIAGNOSIS — Z87891 Personal history of nicotine dependence: Secondary | ICD-10-CM | POA: Diagnosis not present

## 2015-04-24 DIAGNOSIS — E119 Type 2 diabetes mellitus without complications: Secondary | ICD-10-CM | POA: Insufficient documentation

## 2015-04-24 DIAGNOSIS — Z951 Presence of aortocoronary bypass graft: Secondary | ICD-10-CM | POA: Diagnosis not present

## 2015-04-24 DIAGNOSIS — I351 Nonrheumatic aortic (valve) insufficiency: Secondary | ICD-10-CM | POA: Insufficient documentation

## 2015-04-24 MED ORDER — ROSUVASTATIN CALCIUM 10 MG PO TABS: 10.0000 mg | ORAL_TABLET | Freq: Every day | ORAL | Status: DC

## 2015-04-24 NOTE — Telephone Encounter (Signed)
Pt notified of echo results by phone with verbal understanding 

## 2015-04-24 NOTE — Progress Notes (Signed)
History of Present Illness: Troy Banks is a 74 y.o. male patient of Dr. [name] with a with a hx of sinus bradycardia, labile HTN and white coat HTN, hyperkalemia in the setting of ACE inhibitor use, OSA on CPAP, aortic stenosis, DM2.  He was recently hospitalized from 01/19-10/31 with a NSTEMI s/p 4 vessel CABG and AVR.  He was placed on Lipitor and tried every dose from 108m to 284mdaily.  He stated they all caused him to feel so bad he could not put one foot in front of the other.  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    RF: NSTEMI s/p CABG, age, HTN LDL goal < 70 mg/dL, non-HDL < 100 mg/dL Current Therapy: none Intolerances: Lipitor [id number])  Labs:  03/26/15: TC 184, TG 144, HDL 36, LDL 119   Current Outpatient Prescriptions  Medication Sig Dispense Refill  . allopurinol (ZYLOPRIM) 100 MG tablet Take 1 tablet (100 mg total) by mouth 2 (two) times daily. 180 tablet 3  . amiodarone (PACERONE) 200 MG tablet Take 1 tablet (200 mg total) by mouth daily. 30 tablet 1  . amLODipine (NORVASC) 10 MG tablet Take 1 tablet (10 mg total) by mouth daily. 90 tablet 1  . aspirin 81 MG tablet Take 81 mg by mouth daily.    . blood glucose meter kit and supplies KIT Use monitor to check blood sugar twice a day. DX E11.09 1 each 0  . Blood Glucose Monitoring Suppl (ONE TOUCH ULTRA 2) W/DEVICE KIT Test up to two times daily 1 each 1  . Cholecalciferol (VITAMIN D3) 2000 UNITS TABS Take 2,000 Units by mouth daily.    . clopidogrel (PLAVIX) 75 MG tablet Take 1 tablet (75 mg total) by mouth daily. 30 tablet 11  . furosemide (LASIX) 40 MG tablet Take 40 mg by mouth every other day.     . gluMarland Kitchenose blood (ONE TOUCH TEST STRIPS) test strip Test up to two times daily 100 each 12  . HYDROmorphone (DILAUDID) 2 MG tablet Take 0.5-1 tablets (1-2 mg total) by mouth every 4  (four) hours as needed for severe pain. 30 tablet 0  . nabumetone (RELAFEN) 500 MG tablet Take 1 tablet (500 mg total) by mouth 2 (two) times daily as needed for moderate pain. 180 tablet 3  . NON FORMULARY Take 1 tablet by mouth 2 (two) times daily. tumeric    . ONE TOUCH LANCETS MISC Test up to two times daily 100 each 11  . primidone (MYSOLINE) 50 MG tablet Take two tablets in the morning, 1 in the evening 270 tablet 1  . ranitidine (ZANTAC) 150 MG tablet Take 1 tablet (150 mg total) by mouth 2 (two) times daily. 180 tablet 3  . rosuvastatin (CRESTOR) 10 MG tablet Take 1 tablet (10 mg total) by mouth daily. 30 tablet 3   No current facility-administered medications for this visit.   Allergies  Allergen Reactions  . Acyclovir And Related   . Benzodiazepines     Patient states he gets stroke symptoms with benzo's.   . CorMarland Kitcheng [Carvedilol]     Patient poor metabolizer of CYP2D6 - Coreg undergoes extensive hepatic (including 2D6)  . Diazepam Other (See Comments)    Stroke sx  . Lisinopril Other (See Comments)    Pt states C450, and hyperkalemia  . Mobic [Meloxicam]  Patient states he gets stroke symptoms with benzo's.   . Sertraline Hcl     Pt states all meds that are broken down with C-450- if so have sxs of stroke  . Tramadol Other (See Comments)    Unable to take 450   Past Medical History  Diagnosis Date  . COLONIC POLYPS, HX OF   . DIABETES MELLITUS     diet controlled  . DIVERTICULOSIS, COLON   . GOUT   . NEPHROLITHIASIS, HX OF     remote open stone-retrieval on L  . HYPERTENSION     off ACEI 2010 because of hyperkalemia in setting of ARI  . OSA on CPAP   . Cervical stenosis of spinal canal     fusion 2006  . Familial tremor 03/19/2014  . 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 Aortic stenosis, moderate 03/27/2015    post AVR echo Echo 11/16: Mild LVH, EF 55-60%, normal wall motion, grade 1 diastolic  dysfunction, bioprosthetic AVR okay (mean gradient 18 mmHg) with trivial AI, mild LAE, atrial septal aneurysm, small effusion    Assessment and Plan  1.  Hyperlipidemia- Pt has only tried and failed Lipitor.  Doubt this has anything to do with his CYP2D6 deficiency as it is not metabolized by this enzyme.  Would suggest retry of another statin.  Will start with Crestor.  It is not a substrate of CYP2D6.  Will start at 54m daily.  This should hopefully get pt to LDL goal and limit risk of myalgias.  Pt has a follow up scheduled with PA in January.  Will recheck labs at that time.  Will not make a follow up appt for the lipid clinic unless patient is unable to tolerate Crestor.

## 2015-04-24 NOTE — Patient Instructions (Signed)
Start Crestor 10mg  daily.  If you have any problems, please call Gay Filler at [phone].   We will recheck your labs at your appointment in January.  Please make sure you are fasting.

## 2015-04-24 NOTE — Progress Notes (Deleted)
Cardiology Office Note   Date:  04/24/2015   ID:  [name], DOB 10/11/1940, MRN [id number]   Patient Care Team: Troy Borg, MD as PCP - General (Internal Medicine) Troy M [name], MD as Consulting Physician (Cardiology)    Chief Complaint  Patient presents with  . Hyperlipidemia     History of Present Illness: Troy Banks is a 74 y.o. male with a hx of sinus bradycardia, labile HTN and white coat HTN, hyperkalemia in the setting of ACE inhibitor use, OSA on CPAP, aortic stenosis, DM2.  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  Initially seen by Dr. Darlin Coco in 7/15 for bradycardia.  Echo was recommended to evaluate AS, but patient declined.    Admitted 10/19-10/31 with a non-STEMI. LHC demonstrated 3 vessel CAD. He was evaluated by Dr. Servando Snare and underwent CABG with LIMA-LAD, SVG-intermediate, SVG-LCx, SVG-distal RCA and bioprosthetic AVR.  Atenolol was initiated but later stopped secondary to bradycardia. He did develop atrial fibrillation. He was placed on amiodarone with return to NSR. Since discharge, the patient called in with complaints of irregular pulse. He was asked to take 1 extra dose of amiodarone.  Returns for follow-up. Here with his wife. She is retired Quarry manager. The patient's blood pressures at home have been optimal. Chest soreness is improved. He denies fevers or chills. Denies significant dyspnea. Denies orthopnea, PND or edema. Denies syncope. He does have some leg pain from his vein harvest site on the left. This is his most significant complaint. Denies further palpitations. Appetite is okay. He feels that his taste sensation is abnormal. He plans to start cardiac rehabilitation. He has not yet been contacted.   Studies/Reports Reviewed Today:  Carotid US 03/28/15 Bilateral: 1-39% ICA stenosis  LHC 03/26/15  Ost LAD to Prox LAD lesion, 45% stenosed.  Prox LAD  to Mid LAD lesion, 80% stenosed.  1st Diag-1 lesion, 95% stenosed. 1st Diag-2 lesion, 55% stenosed.  Ost Cx lesion, 75% stenosed. Mid Cx lesion, 90% stenosed.  3rd OM, 60% stenosed.  Prox RCA lesion, 60% stenosed. Mid RCA-1 lesion, 99% stenosed. Mid RCA-2 lesion, 60% stenosed.  The left ventricular systolic function is normal. The patient has severe diffuse disease. For the most part his best option is bypass surgery. He does seem to be reluctant to pursue CABG for fear of intraoperative complications. Thankfully he has preserved LV function with relatively normal LVEDP.  Echo 03/26/15 Moderate LVH, EF 60-65%, moderate AS (mean 21 mmHg, peak 44 mmHg), MAC, mild MR, mild BAE   Past Medical History  Diagnosis Date  . COLONIC POLYPS, HX OF   . DIABETES MELLITUS     diet controlled  . DIVERTICULOSIS, COLON   . GOUT   . NEPHROLITHIASIS, HX OF     remote open stone-retrieval on L  . HYPERTENSION     off ACEI 2010 because of hyperkalemia in setting of ARI  . OSA on CPAP   . Cervical stenosis of spinal canal     fusion 2006  . Familial tremor 03/19/2014  . 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 Aortic stenosis, moderate 03/27/2015    Past Surgical History  Procedure Laterality Date  . Strangulated testicle  1960  . Left great toe Left x2- 1985 & 2004  . Anterior fusion cervical spine  2002  . Hernia repair  1962  .  Kidney stones  1986  . Posterior fusion cervical spine  2006  . Cardiac catheterization N/A 03/26/2015    Procedure: Left Heart Cath and Coronary Angiography;  Surgeon: Leonie Man, MD;  Location: [facility];  Service: Cardiovascular;  Laterality: N/A;  . Coronary artery bypass graft N/A 03/30/2015    Procedure: CORONARY ARTERY BYPASS GRAFTING (CABG) x four, using left internal mammary artery and left    leg greater saphenous vein harvested endoscopically ;  Surgeon: Grace Isaac, MD;   Location: [facility];  Service: Open Heart Surgery;  Laterality: N/A;  . Aortic valve replacement N/A 03/30/2015    Procedure: AORTIC VALVE REPLACEMENT (AVR);  Surgeon: Grace Isaac, MD;  Location: [facility];  Service: Open Heart Surgery;  Laterality: N/A;  . Tee without cardioversion N/A 03/30/2015    Procedure: TRANSESOPHAGEAL ECHOCARDIOGRAM (TEE);  Surgeon: Grace Isaac, MD;  Location: [facility];  Service: Open Heart Surgery;  Laterality: N/A;     Current Outpatient Prescriptions  Medication Sig Dispense Refill  . allopurinol (ZYLOPRIM) 100 MG tablet Take 1 tablet (100 mg total) by mouth 2 (two) times daily. 180 tablet 3  . amiodarone (PACERONE) 200 MG tablet Take 1 tablet (200 mg total) by mouth daily. 30 tablet 1  . amLODipine (NORVASC) 10 MG tablet Take 1 tablet (10 mg total) by mouth daily. 90 tablet 1  . aspirin 81 MG tablet Take 81 mg by mouth daily.    . blood glucose meter kit and supplies KIT Use monitor to check blood sugar twice a day. DX E11.09 1 each 0  . Blood Glucose Monitoring Suppl (ONE TOUCH ULTRA 2) W/DEVICE KIT Test up to two times daily 1 each 1  . Cholecalciferol (VITAMIN D3) 2000 UNITS TABS Take 2,000 Units by mouth daily.    . clopidogrel (PLAVIX) 75 MG tablet Take 1 tablet (75 mg total) by mouth daily. 30 tablet 11  . furosemide (LASIX) 40 MG tablet Take 40 mg by mouth every other day.     [name] glucose blood (ONE TOUCH TEST STRIPS) test strip Test up to two times daily 100 each 12  . HYDROmorphone (DILAUDID) 2 MG tablet Take 0.5-1 tablets (1-2 mg total) by mouth every 4 (four) hours as needed for severe pain. 30 tablet 0  . nabumetone (RELAFEN) 500 MG tablet Take 1 tablet (500 mg total) by mouth 2 (two) times daily as needed for moderate pain. 180 tablet 3  . NON FORMULARY Take 1 tablet by mouth 2 (two) times daily. tumeric    . ONE TOUCH LANCETS MISC Test up to two times daily 100 each 11  . primidone (MYSOLINE) 50 MG tablet Take two tablets in the morning, 1 in the  evening 270 tablet 1  . ranitidine (ZANTAC) 150 MG tablet Take 1 tablet (150 mg total) by mouth 2 (two) times daily. 180 tablet 3   No current facility-administered medications for this visit.    Allergies:   Acyclovir and related; Benzodiazepines; Coreg; Diazepam; Lisinopril; Mobic; Sertraline hcl; and Tramadol    Social History:   Social History   Social History  . Marital Status: Divorced    Spouse Name: N/A  . Number of Children: N/A  . Years of Education: N/A   Occupational History  . Retired Animal nutritionist    Social History Main Topics  . Smoking status: Former Smoker    Quit date: 06/06/1990  . Smokeless tobacco: Not on file     Comment:  widowed 1991 - lives with SO [name] who is Therapist, sports  . Alcohol Use: Yes     Comment: glass of wine once a week  . Drug Use: No  . Sexual Activity: Not on file   Other Topics Concern  . Not on file   Social History Narrative     Family History:   Family History  Problem Relation Age of Onset  . Heart disease Mother   . Hypertension Mother   . Heart disease Father   . Hypertension Father   . Prostate cancer Maternal Uncle   . Hypertension Maternal Grandmother   . Heart disease Maternal Grandmother   . Hypertension Maternal Grandfather   . Heart disease Maternal Grandfather   . Hypertension Paternal Grandfather   . Heart disease Paternal Grandfather   . Diabetes Paternal Grandfather   . Alcohol abuse Other   . Arthritis Other   . Diabetes Mother       ROS:   Please see the history of present illness.   Review of Systems  Constitution: Positive for decreased appetite.  Cardiovascular: Positive for chest pain.  Respiratory: Positive for shortness of breath.   All other systems reviewed and are negative.     PHYSICAL EXAM: VS:  There were no vitals taken for this visit.    Wt Readings from Last 3 Encounters:  04/20/15 221 lb (100.245 kg)  04/06/15 228 lb 1.6 oz (103.465 kg)  03/17/15 239 lb (108.41 kg)       GEN: Well nourished, well developed, in no acute distress HEENT: normal Neck: no JVD,   no masses Cardiac:  Normal J2/E2, RRR; 1/6 systolic murmur RUSB,  no rubs or gallops, no edema   Chest:  Median sternotomy well healed without erythema or d/c Respiratory:  clear to auscultation bilaterally, no wheezing, rhonchi or rales. GI: soft, nontender, nondistended, + BS MS: no deformity or atrophy Skin: warm and dry  Neuro:  CNs II-XII intact, Strength and sensation are intact Psych: Normal affect   EKG:  EKG is ordered today.  It demonstrates:   NSR, HR 73, normal axis, T-wave inversions in 2, 3, aVF, V5-V6, QTc 440 ms   Recent Labs: 03/26/2015: TSH 7.951* 03/29/2015: ALT 30 03/31/2015: Magnesium 1.9 04/02/2015: BUN 12; Creatinine, Ser 1.00; Potassium 4.4; Sodium 131* 04/04/2015: Hemoglobin 11.0*; Platelets 147*    Lipid Panel    Component Value Date/Time   CHOL 184 03/26/2015 0343   TRIG 144 03/26/2015 0343   HDL 36* 03/26/2015 0343   CHOLHDL 5.1 03/26/2015 0343   VLDL 29 03/26/2015 0343   LDLCALC 119* 03/26/2015 0343   LDLDIRECT 100.0 09/08/2014 0914      ASSESSMENT AND PLAN:  1. CAD:  S/p recent NSTEMI and subsequent CABG + AVR.  He is progressing well.  He plans to start Cardiac Rehab after seeing Dr. Servando Snare. He is not on ASA for unclear reasons. Reviewed with pharmacy.  There is no metabolism in the CYP2D6 pathway.  Start ASA 81 mg QD.  We should consider ASA + Plavix given ACS and subsequent CABG.  I will ask Dr. [name] and Dr. Servando Snare to weigh in on this as well.  Of note, Plavix is metabolized primarily in the 2C19 pathway. I would assume that this should be ok.  He stopped statin Rx given myalgias. This may have potentially been caused due to his genetic variant.  Will send to Lipid Clinic to help with management.  He has leg pain from his vein harvest site.  I have asked him to review this with Dr. Servando Snare at [facility].  2. Aortic Stenosis:  S/p bioprosthetic AVR at the  time of his CABG.  Continue SBE prophylaxis. Arrange FU echo.     3. HTN:  Fair control. Continue to monitor.    4. Hyperlipidemia:  As noted he stopped Lipitor 20 due to myalgias. Refer to Lipid Clinic.   5. Carotid Stenosis:  Bilateral ICA 1-39% stenosis by pre-CABG dopplers.  Repeat carotid US in 1 year.     6. PAF:  Post op AFib.  He converted to NSR on Amiodarone. Maintaining NSR.  Will likely DC this in the next 2-3 mos if he remains in NSR.     7. 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 His HR is normal off beta-blocker therapy.   8. OSA:  Continue CPAP.        Medication Changes: Current medicines are reviewed at length with the patient today.  Concerns regarding medicines are as outlined above.  The following changes have been made:   Discontinued Medications   No medications on file   Modified Medications   No medications on file   New Prescriptions   No medications on file   Labs/ tests ordered today include:   No orders of the defined types were placed in this encounter.     Disposition:    As Dr. Darlin Coco is set to retire in the next 6 mos, the patient requests FU with Dr. Peter Banks. FU with Dr. Peter Banks in 6-8 weeks.     Signed, Versie Starks, MHS 04/24/2015 8:38 AM    [facility] [address], [place], [place]  [id number] Phone: [phone]; Fax: [phone]

## 2015-04-28 ENCOUNTER — Other Ambulatory Visit: Payer: Self-pay | Admitting: Cardiothoracic Surgery

## 2015-04-28 DIAGNOSIS — Z951 Presence of aortocoronary bypass graft: Secondary | ICD-10-CM

## 2015-05-03 ENCOUNTER — Other Ambulatory Visit: Payer: Self-pay | Admitting: Physician Assistant

## 2015-05-04 ENCOUNTER — Ambulatory Visit (INDEPENDENT_AMBULATORY_CARE_PROVIDER_SITE_OTHER): Payer: Self-pay | Admitting: Surgical

## 2015-05-04 ENCOUNTER — Ambulatory Visit
Admission: RE | Admit: 2015-05-04 | Discharge: 2015-05-04 | Disposition: A | Discharge status: Home / Self Care | Payer: Medicare Other | Source: Ambulatory Visit | Attending: Cardiothoracic Surgery | Admitting: Cardiothoracic Surgery

## 2015-05-04 VITALS — BP 153/71 | HR 71 | Resp 16 | Ht 68.0 in | Wt 216.0 lb

## 2015-05-04 DIAGNOSIS — Z952 Presence of prosthetic heart valve: Secondary | ICD-10-CM

## 2015-05-04 DIAGNOSIS — Z951 Presence of aortocoronary bypass graft: Secondary | ICD-10-CM

## 2015-05-04 DIAGNOSIS — I2510 Atherosclerotic heart disease of native coronary artery without angina pectoris: Secondary | ICD-10-CM

## 2015-05-04 DIAGNOSIS — Z954 Presence of other heart-valve replacement: Secondary | ICD-10-CM

## 2015-05-04 DIAGNOSIS — Z9889 Other specified postprocedural states: Secondary | ICD-10-CM | POA: Diagnosis not present

## 2015-05-04 DIAGNOSIS — I350 Nonrheumatic aortic (valve) stenosis: Secondary | ICD-10-CM

## 2015-05-04 DIAGNOSIS — J9811 Atelectasis: Secondary | ICD-10-CM | POA: Diagnosis not present

## 2015-05-04 DIAGNOSIS — I214 Non-ST elevation (NSTEMI) myocardial infarction: Secondary | ICD-10-CM

## 2015-05-04 MED ORDER — FUROSEMIDE 40 MG PO TABS: 40.0000 mg | ORAL_TABLET | ORAL | Status: DC

## 2015-05-04 NOTE — Patient Instructions (Signed)
The patient was given verbal instructions regarding activity progression and driving 

## 2015-05-04 NOTE — Progress Notes (Signed)
[name]       [place] [address]             [phone]                  Troy Banks Troy Banks  Referring VZ:DGLOVFI, Troy Green, MD Primary Cardiology: Primary Care:[name], MD  Chief Complaint:  Follow Up Visit DATE OF PROCEDURE: 03/30/2015 DATE OF DISCHARGE:   OPERATIVE REPORT   PREOPERATIVE DIAGNOSIS: Three-vessel coronary artery disease with recent non-STEMI myocardial infarction and moderate aortic stenosis.  POSTOPERATIVE DIAGNOSIS: Three-vessel coronary artery disease with recent non-STEMI myocardial infarction and moderate aortic stenosis.  SURGICAL PROCEDURE: Coronary artery bypass grafting x4, with the left internal mammary to the left anterior descending coronary artery, reverse saphenous vein graft to the intermediate coronary artery, reverse saphenous vein graft to the circumflex coronary artery, reverse saphenous vein graft to the distal right coronary artery, and aortic valve replacement with a pericardial tissue valve [employer], model #[id number], 23 mm, serial #[id number] and Endovein greater at the left, greater saphenous thigh and calf Endovein harvesting.  SURGEON: Lanelle Bal, M.D.  FIRST ASSISTANT: Lars Pinks, PA.  History of Present Illness:    The patient is a 74 year old male seen in routine office follow-up status post the above procedure. He overall reports that he feels quite well and is in fact more energetic than he has been in quite some time. He has only some mild discomforts. He denies shortness of breath. He denies anginal chest pain. He denies fevers, chills or other constitutional symptoms. He is walking approximately 1 mile daily.      [name] Score: At the time of surgery this patient's most appropriate activity status/level should be described as: _0     0    Normal activity, no  symptoms _1     1    Restricted in physical strenuous activity but ambulatory, able to do out light work _2     2    Ambulatory and capable of self care, unable to do work activities, up and about                 >50 % of waking hours                                                                                   _3     3    Only limited self care, in bed greater than 50% of waking hours _4     4    Completely disabled, no self care, confined to bed or chair _5     5    Moribund  History  Smoking status  . Former Smoker  . Quit date: 06/06/1990  Smokeless tobacco  . Not on file    Comment: widowed 1991 - lives with SO [name] who is RN       Allergies  Allergen Reactions  . Acyclovir And Related   . Benzodiazepines     Patient states he gets stroke symptoms with benzo's.   [name] [Carvedilol]     Patient poor metabolizer of CYP2D6 - Coreg  undergoes extensive hepatic (including 2D6)  . Diazepam Other (See Comments)    Stroke sx  . Lisinopril Other (See Comments)    Pt states C450, and hyperkalemia  . Mobic [Meloxicam]     Patient states he gets stroke symptoms with benzo's.   . Sertraline Hcl     Pt states all meds that are broken down with C-450- if so have sxs of stroke  . Tramadol Other (See Comments)    Unable to take 450    Current Outpatient Prescriptions  Medication Sig Dispense Refill  . allopurinol (ZYLOPRIM) 100 MG tablet Take 1 tablet (100 mg total) by mouth 2 (two) times daily. 180 tablet 3  . amiodarone (PACERONE) 200 MG tablet Take 1 tablet (200 mg total) by mouth daily. 30 tablet 1  . amLODipine (NORVASC) 10 MG tablet Take 1 tablet (10 mg total) by mouth daily. 90 tablet 1  . aspirin 81 MG tablet Take 81 mg by mouth daily.    . blood glucose meter kit and supplies KIT Use monitor to check blood sugar twice a day. DX E11.09 1 each 0  . Blood Glucose Monitoring Suppl (ONE TOUCH ULTRA 2) W/DEVICE KIT Test up to two times daily 1 each 1  . clopidogrel (PLAVIX) 75  MG tablet Take 1 tablet (75 mg total) by mouth daily. 30 tablet 11  . furosemide (LASIX) 40 MG tablet Take 1 tablet (40 mg total) by mouth every other day. 30 tablet 0  . glucose blood (ONE TOUCH TEST STRIPS) test strip Test up to two times daily 100 each 12  . nabumetone (RELAFEN) 500 MG tablet Take 1 tablet (500 mg total) by mouth 2 (two) times daily as needed for moderate pain. 180 tablet 3  . NON FORMULARY Take 1 tablet by mouth 2 (two) times daily. tumeric    . ONE TOUCH LANCETS MISC Test up to two times daily 100 each 11  . primidone (MYSOLINE) 50 MG tablet Take two tablets in the morning, 1 in the evening 270 tablet 1  . ranitidine (ZANTAC) 150 MG tablet Take 1 tablet (150 mg total) by mouth 2 (two) times daily. 180 tablet 3  . rosuvastatin (CRESTOR) 10 MG tablet Take 1 tablet (10 mg total) by mouth daily. 30 tablet 3  . Cholecalciferol (VITAMIN D3) 2000 UNITS TABS Take 2,000 Units by mouth daily.    [name] HYDROmorphone (DILAUDID) 2 MG tablet Take 0.5-1 tablets (1-2 mg total) by mouth every 4 (four) hours as needed for severe pain. (Patient not taking: Reported on 05/04/2015) 30 tablet 0   No current facility-administered medications for this visit.       Physical Exam: BP 153/71 mmHg  Pulse 71  Resp 16  Ht _0  (1.727 m)  Wt 216 lb (97.977 kg)  BMI 32.85 kg/m2  SpO2 98%  General appearance: alert, cooperative and no distress Heart: regular rate and rhythm Lungs: clear to auscultation bilaterally Abdomen: soft, non-tender; bowel sounds normal; no masses,  no organomegaly Extremities: +1 plus lower extremity edema Wound: Incisions are noted to be healing well without evidence of infection. Sternum is stable.   Diagnostic Studies & Laboratory data:         Recent Radiology Findings: Dg Chest 2 View  05/04/2015  CLINICAL DATA:  Hx heart surg 3 weeks ago, doing well, hx HTN, former smoker EXAM: CHEST  2 VIEW COMPARISON:  04/02/2015 FINDINGS: Sternal wires are intact. Cardiac  silhouette is normal in size and configuration. Prosthetic aortic  valve is stable. Normal mediastinal and hilar contours. No mediastinal widening. Atelectasis noted in the left mid to lower lung on the prior study has mostly resolved. Mild atelectasis or scarring noted in the left upper lobe lingula. Lungs otherwise clear. Lungs are go mildly hyperexpanded. Minimal left pleural effusion versus pleural thickening. No right pleural effusion. No pneumothorax. Bony thorax is demineralized with old right-sided rib fractures noted. IMPRESSION: 1. No acute cardiopulmonary disease. 2. Most of the left lung atelectasis has resolved since the prior exam. Electronically Signed   By: Lajean Manes M.D.   On: 05/04/2015 12:39      I have independently reviewed the above radiology findings and reviewed findings  with the patient.  Recent Labs: Lab Results  Component Value Date   WBC 7.6 04/04/2015   HGB 11.0* 04/04/2015   HCT 32.2* 04/04/2015   PLT 147* 04/04/2015   GLUCOSE 178* 04/02/2015   CHOL 184 03/26/2015   TRIG 144 03/26/2015   HDL 36* 03/26/2015   LDLDIRECT 100.0 09/08/2014   LDLCALC 119* 03/26/2015   ALT 30 03/29/2015   AST 23 03/29/2015   NA 131* 04/02/2015   K 4.4 04/02/2015   CL 97* 04/02/2015   CREATININE 1.00 04/02/2015   BUN 12 04/02/2015   CO2 28 04/02/2015   TSH 7.951* 03/26/2015   INR 1.47 03/30/2015   HGBA1C 6.0* 03/26/2015      Assessment / Plan:  Overall the patient is doing quite well status post CABG/AVR as described above. I have given him further instructions regarding activity progression. The office will have the cardiac rehabilitation program contact him to begin. He is given lifting and weight limit instructions. He is given driving instructions. Additionally, I did refill his Lasix prescription. We will see again in 1 month with a chest x-ray and if no surgical issues at that time when necessary.         GOLD,WAYNE E 05/04/2015 1:46 PM

## 2015-05-11 ENCOUNTER — Ambulatory Visit (INDEPENDENT_AMBULATORY_CARE_PROVIDER_SITE_OTHER): Payer: Medicare Other | Admitting: Neurology

## 2015-05-11 ENCOUNTER — Telehealth: Payer: Self-pay | Admitting: Neurology

## 2015-05-11 DIAGNOSIS — R253 Fasciculation: Secondary | ICD-10-CM

## 2015-05-11 DIAGNOSIS — M5417 Radiculopathy, lumbosacral region: Secondary | ICD-10-CM

## 2015-05-11 DIAGNOSIS — M545 Low back pain: Secondary | ICD-10-CM

## 2015-05-11 DIAGNOSIS — M5412 Radiculopathy, cervical region: Secondary | ICD-10-CM

## 2015-05-11 NOTE — Telephone Encounter (Signed)
Patient made aware. He states tremor has gotten worse since heart surgery (6 weeks ago). He was made aware tremor (and other symptoms) can get worse after surgery but should get better on its own. He will call and let us know if it doesn't improve.

## 2015-05-11 NOTE — Procedures (Signed)
[facility]  [address]  [place], [place] [address] Tel: [phone] Fax:  [phone] Test Date:  05/11/2015  Patient: Troy Banks DOB: 1941-03-28 Physician: Narda Amber  Sex: Male Height: 5\' 8"  Ref Phys: Narda Amber  ID#: [id number] Temp: 33.0C Technician: Jerilynn Mages. Dean   Patient Complaints: This is a 74 year-old gentleman returning for repeat electrodiagnostic testing to look for evolving disorder of anterior horn cells manifesting with right sided fasciculations and tremor.  He has a prior history of neck surgery.   NCV & EMG Findings: Extensive electrodiagnostic testing of the right upper extremity, right lower extremity, and midthoracic paraspinal muscles  (T7 levels) shows:  1. Right median, ulnar, and radial sensory responses are within normal limits. 2. Right median and ulnar motor responses are within normal limits. 3. Right sural and superficial peroneal sensory responses are within normal limits. 4. Right peroneal and tibial motor responses are within normal limits. 5. In the arms, chronic motor axon loss changes are seen affecting C6 myotomes. Fasciculation potentials are seen involving 1 of 7 tested muscles.  There is no longer any active ongoing denervation. 6. In the legs, chronic motor axon loss changes are seen affecting the medial gastrocnemius and biceps femoris short head muscles. Isolated fasciculation potentials were seen involving the flexor digitorum profundus muscle. There is no longer any evidence of active denervation. 7. There is no evidence of active or chronic motor axon loss changes affecting the thoracic paraspinal muscles at the T7 levels.  Impression: 1. Chronic C6 radiculopathy affecting the right upper extremity, moderate in degree electrically. 2. Chronic S1 radiculopathy affecting the right lower extremity, very mild in degree electrically. 3. When compared to his previous electrodiagnostic study dated 10/29/2014, there  is an interval improvement and specifically, there is no longer evidence of active denervation and fasciculation potentials are also significantly decreased, seen in only 2 of the 13 tested muscles.    _____________________________ Narda Amber, D.O.    Nerve Conduction Studies Anti Sensory Summary Table   Stim Site NR Peak (ms) Norm Peak (ms) P-T Amp (V) Norm P-T Amp  Right Median Anti Sensory (2nd Digit)  33C  Wrist    3.5 <3.7 11.6 >10  Right Radial Anti Sensory (Base 1st Digit)  33C  Wrist    1.5 <2.7 22.0 >10  Right Sup Peroneal Anti Sensory (Ant Lat Mall)  33C  12 cm    3.7 <4 11.5 >3  Right Sural Anti Sensory (Lat Mall)  33C  Calf    2.8 <4.4 11.3 >3  Right Ulnar Anti Sensory (5th Digit)  33C  Wrist    3.0 <3.5 15.4 >5   Motor Summary Table   Stim Site NR Onset (ms) Norm Onset (ms) O-P Amp (mV) Norm O-P Amp Site1 Site2 Delta-0 (ms) Dist (cm) Vel (m/s) Norm Vel (m/s)  Right Median Motor (Abd Poll Brev)  33C  Wrist    3.8 <4.0 9.9 >4 Elbow Wrist 4.3 24.0 56 >49  Elbow    8.1  9.5         Right Peroneal Motor (Ext Dig Brev)  Ankle    3.5 <6.0 8.6 >2 B Fib Ankle 6.5 30.0 46 >41  B Fib    10.0  7.9  Poplt B Fib 1.9 10.0 53 >41  Poplt    11.9  7.6         Right Tibial Motor (Abd Hall Brev)  33C  Ankle    3.4 <6.0 8.1 >  3.0 Knee Ankle 8.4 38.0 45 >41  Knee    11.8  5.5         Right Ulnar Motor (Abd Dig Minimi)  33C  Wrist    3.0 <3.1 9.9 >6 B Elbow Wrist 4.0 23.0 58 >49  B Elbow    7.0  9.5  A Elbow B Elbow 1.5 10.0 67 >49  A Elbow    8.5  9.2          EMG   Side Muscle Ins Act Fibs Psw Fasc Number Recrt Dur Dur. Amp Amp. Poly Poly. Comment  Right 1stDorInt Nml Nml Nml Nml Nml Nml Nml Nml Nml Nml Nml Nml N/A  Right AntTibialis 1+ Nml Nml Nml Nml Nml Nml Nml Nml Nml Nml Nml N/A  Right Gastroc Nml Nml Nml Nml 1- Mod-R Some 1+ Some 1+ Nml Nml N/A  Right Flex Dig Long Nml Nml Nml 1+ Nml Nml Nml Nml Nml Nml Nml Nml N/A  Right RectFemoris Nml Nml Nml Nml Nml Nml Nml  Nml Nml Nml Nml Nml N/A  Right BicepsFemS Nml Nml Nml Nml 1- Mod-R Few 1+ Few 1+ Nml Nml N/A  Right GluteusMed Nml Nml Nml Nml Nml Nml Nml Nml Nml Nml Nml Nml N/A  Right T7 Parasp Nml Nml Nml Nml Nml Nml Nml Nml Nml Nml Nml Nml N/A  Right Ext Indicis Nml Nml Nml Nml Nml Nml Nml Nml Nml Nml Nml Nml N/A  Right PronatorTeres Nml Nml Nml Nml Nml Nml Nml Nml Nml Nml Nml Nml N/A  Right Biceps Nml Nml Nml Nml 1- Rapid Many 1+ Many 1+ Nml Nml N/A  Right Triceps Nml Nml Nml Nml Nml Nml Nml Nml Nml Nml Nml Nml N/A  Right Deltoid Nml Nml Nml 1+ 1- Rapid Many 1+ Many 1+ Nml Nml N/A      Waveforms:

## 2015-05-11 NOTE — Telephone Encounter (Signed)
-----   Message from [place], DO sent at 05/11/2015 12:14 PM EST ----- Luvenia Starch, let pt/wife know that EMG looked much improved compared to previous.

## 2015-05-12 ENCOUNTER — Encounter: Payer: Medicare Other | Admitting: Neurology

## 2015-05-27 ENCOUNTER — Encounter: Payer: Self-pay | Admitting: Cardiology

## 2015-05-28 ENCOUNTER — Other Ambulatory Visit: Payer: Self-pay | Admitting: Internal Medicine

## 2015-06-02 ENCOUNTER — Other Ambulatory Visit: Payer: Self-pay | Admitting: Neurology

## 2015-06-02 ENCOUNTER — Other Ambulatory Visit: Payer: Self-pay | Admitting: Cardiology

## 2015-06-02 NOTE — Telephone Encounter (Signed)
Rx sent 

## 2015-06-02 NOTE — Telephone Encounter (Signed)
Rx request sent to pharmacy.  

## 2015-06-03 ENCOUNTER — Telehealth: Payer: Self-pay | Admitting: Physician Assistant

## 2015-06-03 NOTE — Telephone Encounter (Signed)
Patient should contact provider who prescribed Primidone and request an alternative while he is taking Amiodarone. Amiodarone will likely be DC'd in the next 2 months - will decide at FU. Richardson Dopp, [name]   06/03/2015 5:52 PM

## 2015-06-03 NOTE — Telephone Encounter (Signed)
I s/w Pharm D at pharmacy in regards to interaction between Amiodarone and Primidone. She just wanted to make sure dr aware of interaction even though pt has been on this for some time now. wants to make sure we are checking lab work.

## 2015-06-03 NOTE — Telephone Encounter (Signed)
New Message:  Juliann Pulse( the pharmacist) called in wanting to report a drug interaction between the pt's Amiodarone and Primadone. Please f/u with her  Thanks

## 2015-06-09 ENCOUNTER — Telehealth: Payer: Self-pay | Admitting: Physician Assistant

## 2015-06-09 NOTE — Telephone Encounter (Signed)
DPR on file for wife. Pt's wife states pt is fine taking both Primadone and Amiodarone at this time. She said pt see's Kerin Ransom tomorrow so they will talk then about the 2 meds.

## 2015-06-09 NOTE — Telephone Encounter (Signed)
Routed to [facility] for [facility] since pt has appt tomorrow

## 2015-06-09 NOTE — Telephone Encounter (Signed)
Follow up     Troy Banks returning call back to nurse

## 2015-06-10 ENCOUNTER — Ambulatory Visit: Payer: Medicare Other | Admitting: Cardiology

## 2015-06-10 ENCOUNTER — Emergency Department (HOSPITAL_COMMUNITY)
Admission: EM | Admit: 2015-06-10 | Discharge: 2015-06-11 | Disposition: A | Discharge status: Home / Self Care | Payer: Medicare Other | Source: Home / Self Care | Attending: Emergency Medicine | Admitting: Emergency Medicine

## 2015-06-10 ENCOUNTER — Encounter (HOSPITAL_COMMUNITY): Payer: Self-pay | Admitting: Family Medicine

## 2015-06-10 ENCOUNTER — Emergency Department (EMERGENCY_DEPARTMENT_HOSPITAL): Payer: Medicare Other

## 2015-06-10 ENCOUNTER — Emergency Department (HOSPITAL_COMMUNITY): Payer: Medicare Other

## 2015-06-10 ENCOUNTER — Encounter: Payer: Self-pay | Admitting: Cardiology

## 2015-06-10 DIAGNOSIS — M79609 Pain in unspecified limb: Secondary | ICD-10-CM

## 2015-06-10 DIAGNOSIS — M109 Gout, unspecified: Secondary | ICD-10-CM | POA: Diagnosis not present

## 2015-06-10 DIAGNOSIS — M7989 Other specified soft tissue disorders: Secondary | ICD-10-CM | POA: Diagnosis not present

## 2015-06-10 DIAGNOSIS — E785 Hyperlipidemia, unspecified: Secondary | ICD-10-CM | POA: Insufficient documentation

## 2015-06-10 DIAGNOSIS — M25562 Pain in left knee: Secondary | ICD-10-CM | POA: Diagnosis not present

## 2015-06-10 DIAGNOSIS — E119 Type 2 diabetes mellitus without complications: Secondary | ICD-10-CM | POA: Diagnosis not present

## 2015-06-10 DIAGNOSIS — I5032 Chronic diastolic (congestive) heart failure: Secondary | ICD-10-CM | POA: Diagnosis not present

## 2015-06-10 DIAGNOSIS — I480 Paroxysmal atrial fibrillation: Secondary | ICD-10-CM | POA: Insufficient documentation

## 2015-06-10 DIAGNOSIS — G4733 Obstructive sleep apnea (adult) (pediatric): Secondary | ICD-10-CM | POA: Diagnosis not present

## 2015-06-10 DIAGNOSIS — Z952 Presence of prosthetic heart valve: Secondary | ICD-10-CM | POA: Insufficient documentation

## 2015-06-10 DIAGNOSIS — M25462 Effusion, left knee: Secondary | ICD-10-CM | POA: Diagnosis not present

## 2015-06-10 DIAGNOSIS — I2510 Atherosclerotic heart disease of native coronary artery without angina pectoris: Secondary | ICD-10-CM | POA: Diagnosis not present

## 2015-06-10 DIAGNOSIS — M10062 Idiopathic gout, left knee: Secondary | ICD-10-CM | POA: Diagnosis not present

## 2015-06-10 DIAGNOSIS — I110 Hypertensive heart disease with heart failure: Secondary | ICD-10-CM | POA: Diagnosis not present

## 2015-06-10 LAB — CBC WITH DIFFERENTIAL/PLATELET
Basophils Absolute: 0 10*3/uL (ref 0.0–0.1)
Basophils Relative: 0 %
EOS PCT: 0 %
Eosinophils Absolute: 0 10*3/uL (ref 0.0–0.7)
HCT: 45 % (ref 39.0–52.0)
HEMOGLOBIN: 14.9 g/dL (ref 13.0–17.0)
LYMPHS PCT: 8 %
Lymphs Abs: 1 10*3/uL (ref 0.7–4.0)
MCH: 28.6 pg (ref 26.0–34.0)
MCHC: 33.1 g/dL (ref 30.0–36.0)
MCV: 86.4 fL (ref 78.0–100.0)
MONO ABS: 1.8 10*3/uL — AB (ref 0.1–1.0)
Monocytes Relative: 16 %
NEUTROS ABS: 8.6 10*3/uL — AB (ref 1.7–7.7)
Neutrophils Relative %: 76 %
Platelets: 166 10*3/uL (ref 150–400)
RBC: 5.21 MIL/uL (ref 4.22–5.81)
RDW: 14.8 % (ref 11.5–15.5)
WBC: 11.4 10*3/uL — ABNORMAL HIGH (ref 4.0–10.5)

## 2015-06-10 LAB — SYNOVIAL CELL COUNT + DIFF, W/ CRYSTALS
EOSINOPHILS-SYNOVIAL: 0 % (ref 0–1)
LYMPHOCYTES-SYNOVIAL FLD: 1 % (ref 0–20)
MONOCYTE-MACROPHAGE-SYNOVIAL FLUID: 4 % — AB (ref 50–90)
Neutrophil, Synovial: 95 % — ABNORMAL HIGH (ref 0–25)
WBC, Synovial: 26000 /mm3 — ABNORMAL HIGH (ref 0–200)

## 2015-06-10 LAB — BASIC METABOLIC PANEL
Anion gap: 11 (ref 5–15)
BUN: 13 mg/dL (ref 6–20)
CALCIUM: 9.6 mg/dL (ref 8.9–10.3)
CHLORIDE: 102 mmol/L (ref 101–111)
CO2: 25 mmol/L (ref 22–32)
CREATININE: 0.89 mg/dL (ref 0.61–1.24)
GFR calc non Af Amer: >60 mL/min (ref >60)
Glucose, Bld: 138 mg/dL — ABNORMAL HIGH (ref 65–99)
Potassium: 4.2 mmol/L (ref 3.5–5.1)
SODIUM: 138 mmol/L (ref 135–145)

## 2015-06-10 LAB — SEDIMENTATION RATE: SED RATE: 16 mm/h (ref 0–16)

## 2015-06-10 MED ORDER — HYDROMORPHONE HCL 2 MG PO TABS
2.0000 mg | ORAL_TABLET | Freq: Once | ORAL | Status: AC
  Administered 2015-06-10: 2 mg via ORAL
  Filled 2015-06-10: qty 1

## 2015-06-10 MED ORDER — LIDOCAINE HCL (PF) 1 % IJ SOLN
5.0000 mL | Freq: Once | INTRAMUSCULAR | Status: DC
  Filled 2015-06-10: qty 5

## 2015-06-10 MED ORDER — PENTAFLUOROPROP-TETRAFLUOROETH EX AERO
INHALATION_SPRAY | CUTANEOUS | Status: DC | PRN
  Administered 2015-06-10: 22:00:00 via TOPICAL

## 2015-06-10 NOTE — Progress Notes (Signed)
VASCULAR LAB PRELIMINARY  PRELIMINARY  PRELIMINARY  PRELIMINARY  Left lower extremity venous duplex completed.    Preliminary report:  Left:  No evidence of DVT, superficial thrombosis, or Baker's cyst.  Shamiya Demeritt, RVS 06/10/2015, 7:32 PM

## 2015-06-10 NOTE — ED Notes (Signed)
Patient given water to drink.  

## 2015-06-10 NOTE — ED Provider Notes (Signed)
CSN: [id number]     Arrival date & time 06/10/15  1114 History   First MD Initiated Contact with Patient 06/10/15 1531     Chief Complaint  Patient presents with  . Leg Pain  . Knee Pain     (Consider location/radiation/quality/duration/timing/severity/associated sxs/prior Treatment) HPI Comments: 75yo M w/ h/o CAD s/p CABG, gout, HTN, aortic stenosis who p/w L leg pain. The patient began having leg pain and swelling early in the morning 3 days ago. He denies any trauma or recent falls. He was initially able to walk on his leg for the first few days but today has had extreme difficulty ambulating because of the pain. He states that the pain starts in his lower thigh and goes through his knee down to his ankle. He has noticed an increase in swelling on his leg. He denies any skin changes; he has an area of redness on his leg from his CABG but this has not changed recently. He has a history of gout but states that this is different. No fevers, vomiting, or recent illness. No history blood clots.  Patient is a 75 y.o. male presenting with leg pain and knee pain. The history is provided by the patient.  Leg Pain Knee Pain   Past Medical History  Diagnosis Date  . COLONIC POLYPS, HX OF   . DIABETES MELLITUS     diet controlled  . DIVERTICULOSIS, COLON   . GOUT   . NEPHROLITHIASIS, HX OF     remote open stone-retrieval on L  . HYPERTENSION     off ACEI 2010 because of hyperkalemia in setting of ARI  . OSA on CPAP   . Cervical stenosis of spinal canal     fusion 2006  . Familial tremor 03/19/2014  . 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 Aortic stenosis, moderate 03/27/2015    post AVR echo Echo 11/16: Mild LVH, EF 55-60%, normal wall motion, grade 1 diastolic dysfunction, bioprosthetic AVR okay (mean gradient 18 mmHg) with trivial AI, mild LAE, atrial septal aneurysm, small effusion    Past Surgical History  Procedure  Laterality Date  . Strangulated testicle  1960  . Left great toe Left x2- 1985 & 2004  . Anterior fusion cervical spine  2002  . Hernia repair  1962  . Kidney stones  1986  . Posterior fusion cervical spine  2006  . Cardiac catheterization N/A 03/26/2015    Procedure: Left Heart Cath and Coronary Angiography;  Surgeon: Leonie Man, MD;  Location: [facility];  Service: Cardiovascular;  Laterality: N/A;  . Coronary artery bypass graft N/A 03/30/2015    Procedure: CORONARY ARTERY BYPASS GRAFTING (CABG) x four, using left internal mammary artery and left    leg greater saphenous vein harvested endoscopically ;  Surgeon: Grace Isaac, MD;  Location: [facility];  Service: Open Heart Surgery;  Laterality: N/A;  . Aortic valve replacement N/A 03/30/2015    Procedure: AORTIC VALVE REPLACEMENT (AVR);  Surgeon: Grace Isaac, MD;  Location: [facility];  Service: Open Heart Surgery;  Laterality: N/A;  . Tee without cardioversion N/A 03/30/2015    Procedure: TRANSESOPHAGEAL ECHOCARDIOGRAM (TEE);  Surgeon: Grace Isaac, MD;  Location: [facility];  Service: Open Heart Surgery;  Laterality: N/A;   Family History  Problem Relation Age of Onset  . Heart disease Mother   . Hypertension Mother   . Heart disease Father   .  Hypertension Father   . Prostate cancer Maternal Uncle   . Hypertension Maternal Grandmother   . Heart disease Maternal Grandmother   . Hypertension Maternal Grandfather   . Heart disease Maternal Grandfather   . Hypertension Paternal Grandfather   . Heart disease Paternal Grandfather   . Diabetes Paternal Grandfather   . Alcohol abuse Other   . Arthritis Other   . Diabetes Mother    Social History  Substance Use Topics  . Smoking status: Former Smoker    Quit date: 06/06/1990  . Smokeless tobacco: None     Comment: widowed 1991 - lives with SO [name] who is Therapist, sports  . Alcohol Use: Yes     Comment: glass of wine once a week    Review of Systems 10 Systems reviewed and  are negative for acute change except as noted in the HPI.    Allergies  Benzodiazepines; Coreg; Diazepam; Lisinopril; Mobic; Sertraline hcl; Tramadol; Atorvastatin; and Acyclovir and related  Home Medications   Prior to Admission medications   Medication Sig Start Date End Date Taking? Authorizing Provider  allopurinol (ZYLOPRIM) 100 MG tablet Take 1 tablet (100 mg total) by mouth 2 (two) times daily. 12/17/14  Yes Rowe Clack, MD  amiodarone (PACERONE) 200 MG tablet TAKE 1 TABLET BY MOUTH EVERY DAY 06/02/15  Yes [name] Kathlen Mody, [name]  amLODipine (NORVASC) 10 MG tablet Take 1 tablet (10 mg total) by mouth daily. 05/28/15  Yes Rowe Clack, MD  Ascorbic Acid (VITAMIN C) 1000 MG tablet Take 1,000 mg by mouth daily.   Yes Historical Provider, MD  aspirin 81 MG tablet Take 81 mg by mouth daily.   Yes Historical Provider, MD  Cholecalciferol (VITAMIN D3) 2000 UNITS TABS Take 2,000 Units by mouth daily.   Yes Historical Provider, MD  clopidogrel (PLAVIX) 75 MG tablet Take 1 tablet (75 mg total) by mouth daily. 04/22/15 04/21/16 Yes [name] [name], [name]  cyanocobalamin 2000 MCG tablet Take 2,000 mcg by mouth daily.   Yes Historical Provider, MD  furosemide (LASIX) 40 MG tablet Take 1 tablet (40 mg total) by mouth every other day. 05/04/15  Yes Wayne E Gold, [name]  HYDROmorphone (DILAUDID) 2 MG tablet Take 0.5-1 tablets (1-2 mg total) by mouth every 4 (four) hours as needed for severe pain. 04/06/15  Yes Donielle Liston Alba, [name]  Misc Natural Products (CURCUMAX PRO) TABS Take 1 tablet by mouth 2 (two) times daily.   Yes Historical Provider, MD  primidone (MYSOLINE) 50 MG tablet Take two tablets in the morning, 1 in the evening 03/17/15  Yes Rebecca S [name], DO  ranitidine (ZANTAC) 150 MG tablet Take 1 tablet (150 mg total) by mouth 2 (two) times daily. 02/16/15  Yes Rowe Clack, MD  rosuvastatin (CRESTOR) 10 MG tablet Take 1 tablet (10 mg total) by mouth daily. 04/24/15  Yes [name]  [name], MD  blood glucose meter kit and supplies KIT Use monitor to check blood sugar twice a day. DX E11.09 09/29/14   Rowe Clack, MD  Blood Glucose Monitoring Suppl (ONE TOUCH ULTRA 2) W/DEVICE KIT Test up to two times daily 09/29/14   Rowe Clack, MD  glucose blood (ONE TOUCH TEST STRIPS) test strip Test up to two times daily 09/29/14   Rowe Clack, MD  nabumetone (RELAFEN) 500 MG tablet Take 1 tablet (500 mg total) by mouth 2 (two) times daily as needed for moderate pain. 04/09/15   Rowe Clack, MD  ONE  TOUCH LANCETS MISC Test up to two times daily 09/29/14   Rowe Clack, MD  primidone (MYSOLINE) 50 MG tablet Take 100 mg qam (2 pills) and 50 mg qpm (1 pill) 06/02/15   Rebecca S [name], DO   BP 136/61 mmHg  Pulse 68  Temp(Src) 98.8 F (37.1 C) (Oral)  Resp 17  SpO2 96% Physical Exam  Constitutional: He is oriented to person, place, and time. He appears well-developed and well-nourished. No distress.  HENT:  Head: Normocephalic and atraumatic.  Moist mucous membranes  Eyes: Conjunctivae are normal. Pupils are equal, round, and reactive to light.  Neck: Neck supple.  Cardiovascular: Normal rate, regular rhythm and normal heart sounds.   2/6 systolic murmur  Pulmonary/Chest: Effort normal and breath sounds normal.  Abdominal: Soft. Bowel sounds are normal. He exhibits no distension. There is no tenderness.  Musculoskeletal:  Large joint effusion L knee w/ ROM 2/2 pain, no warmth; uniform mild swelling anterior L calf and distal thigh  Neurological: He is alert and oriented to person, place, and time.  Fluent speech, normal sensation  Skin: Skin is warm and dry. No erythema.  Psychiatric: He has a normal mood and affect. Judgment normal.  Nursing note and vitals reviewed.   ED Course  Procedures (including critical care time) Labs Review Labs Reviewed  CBC WITH DIFFERENTIAL/PLATELET - Abnormal; Notable for the following:    WBC 11.4 (*)    Neutro Abs  8.6 (*)    Monocytes Absolute 1.8 (*)    All other components within normal limits  BASIC METABOLIC PANEL - Abnormal; Notable for the following:    Glucose, Bld 138 (*)    All other components within normal limits  SYNOVIAL CELL COUNT + DIFF, W/ CRYSTALS - Abnormal; Notable for the following:    Color, Synovial AMBER (*)    Appearance-Synovial TURBID (*)    WBC, Synovial 26000 (*)    Neutrophil, Synovial 95 (*)    Monocyte-Macrophage-Synovial Fluid 4 (*)    All other components within normal limits  CULTURE, BODY FLUID-BOTTLE  GRAM STAIN  SEDIMENTATION RATE  GLUCOSE, SYNOVIAL FLUID  PROTEIN, SYNOVIAL FLUID    Imaging Review Dg Knee Complete 4 Views Left  06/10/2015  CLINICAL DATA:  LEFT knee pain since CABG 3 months ago, soft tissue swelling with warmth at entire joint for 3 days, cannot bear weight, painful to touch, history gout EXAM: LEFT KNEE - COMPLETE 4+ VIEW COMPARISON:  None FINDINGS: Osseous demineralization. Diffuse joint space narrowing with chondrocalcinosis question CPPD. No acute fracture, dislocation or bone destruction. No juxta-articular erosions. Probable knee joint effusion. Diffuse soft tissue swelling LEFT knee greatest anteriorly. Question calcific debris at the lateral recess. Atherosclerotic calcifications at the distal superficial femoral and popliteal arteries extending into trifurcation vessels. IMPRESSION: Osseous demineralization with degenerative changes and question CPPD. Large joint effusion with regional soft tissue swelling. No definite acute bony abnormalities. Electronically Signed   By: Lavonia Dana M.D.   On: 06/10/2015 17:22   I have personally reviewed and evaluated these lab results as part of my medical decision-making.   EKG Interpretation None     Medications  lidocaine (PF) (XYLOCAINE) 1 % injection 5 mL (5 mLs Infiltration Not Given 06/10/15 2159)  pentafluoroprop-tetrafluoroeth (GEBAUERS) aerosol ( Topical Given 06/10/15 2158)  HYDROmorphone  (DILAUDID) tablet 2 mg (2 mg Oral Given 06/10/15 2019)    MDM   Final diagnoses:  Acute gout of left knee, unspecified cause    Patient presents with left  leg and knee pain for the past several days. On exam, he had a large left knee effusion and some swelling of his anterior lower leg and thigh. He had limited range of motion of his knee secondary to pain. No new skin changes. Obtained above labs to evaluate for signs of infection. Plain film of the knee showed a large joint effusion with possible CPPD changes. Labs showed stable WBC count from previous, normal ESR. Because of the large effusion and patient stating that this feels different from previous gout, differential includes septic arthritis. Discussed risks and benefits of arthrocentesis and patient agreed to procedure. Procedure performed by resident physician Dr. Jearld Pies and medical student Blima Ledger; pt tolerated procedure well. See separate procedure note for details. Gave the patient his home dose of oral Dilaudid. Synovial fluid analysis showed urate crystals, WBC 26,000, consistent with gouty arthritis. Discussed supportive care instructions and close follow-up with PCP to discuss current medication regimen. I sensibly reviewed return precautions including any signs of worsening knee pain, redness, or skin changes to suggest infection. The patient voiced understanding and was discharged in satisfactory condition.  Sharlett Iles, MD 06/10/15 [phone]

## 2015-06-10 NOTE — ED Provider Notes (Signed)
  Physical Exam  BP 148/72 mmHg  Pulse 71  Temp(Src) 98.8 F (37.1 C) (Oral)  Resp 17  SpO2 96%  Physical Exam  ED Course  .Joint Aspiration/Arthrocentesis Date/Time: 06/10/2015 10:39 PM Performed by: Esaw Grandchild Authorized by: Esaw Grandchild Consent: Verbal consent obtained. Written consent obtained. Risks and benefits: risks, benefits and alternatives were discussed Consent given by: patient Patient understanding: patient states understanding of the procedure being performed Patient consent: the patient's understanding of the procedure matches consent given Procedure consent: procedure consent matches procedure scheduled Relevant documents: relevant documents present and verified Test results: test results available and properly labeled Site marked: the operative site was marked Imaging studies: imaging studies available Required items: required blood products, implants, devices, and special equipment available Patient identity confirmed: verbally with patient Time out: Immediately prior to procedure a "time out" was called to verify the correct patient, procedure, equipment, support staff and site/side marked as required. Indications: joint swelling and pain  Body area: knee Joint: left knee Local anesthesia used: no Patient sedated: no Preparation: Patient was prepped and draped in the usual sterile fashion. Needle gauge: 18 G Ultrasound guidance: no Approach: medial Aspirate: yellow and serous Aspirate amount: 50 mL Patient tolerance: Patient tolerated the procedure well with no immediate complications    MDM I was asked to assist with arthrocentesis for this patient.  Procedure performed without complications.  This ends my assistance with the care of this patient.      Esaw Grandchild, MD 06/10/15 [place], MD 06/10/15 2350

## 2015-06-10 NOTE — ED Notes (Signed)
Pt here with left leg pain and sig swelling to left knee since yesterday.

## 2015-06-10 NOTE — Discharge Instructions (Signed)
Gout Gout is an inflammatory arthritis caused by a buildup of uric acid crystals in the joints. Uric acid is a chemical that is normally present in the blood. When the level of uric acid in the blood is too high it can form crystals that deposit in your joints and tissues. This causes joint redness, soreness, and swelling (inflammation). Repeat attacks are common. Over time, uric acid crystals can form into masses (tophi) near a joint, destroying bone and causing disfigurement. Gout is treatable and often preventable. CAUSES  The disease begins with elevated levels of uric acid in the blood. Uric acid is produced by your body when it breaks down a naturally found substance called purines. Certain foods you eat, such as meats and fish, contain high amounts of purines. Causes of an elevated uric acid level include:  Being passed down from parent to child (heredity).  Diseases that cause increased uric acid production (such as obesity, psoriasis, and certain cancers).  Excessive alcohol use.  Diet, especially diets rich in meat and seafood.  Medicines, including certain cancer-fighting medicines (chemotherapy), water pills (diuretics), and aspirin.  Chronic kidney disease. The kidneys are no longer able to remove uric acid well.  Problems with metabolism. Conditions strongly associated with gout include:  Obesity.  High blood pressure.  High cholesterol.  Diabetes. Not everyone with elevated uric acid levels gets gout. It is not understood why some people get gout and others do not. Surgery, joint injury, and eating too much of certain foods are some of the factors that can lead to gout attacks. SYMPTOMS   An attack of gout comes on quickly. It causes intense pain with redness, swelling, and warmth in a joint.  Fever can occur.  Often, only one joint is involved. Certain joints are more commonly involved:  Base of the big toe.  Knee.  Ankle.  Wrist.  Finger. Without  treatment, an attack usually goes away in a few days to weeks. Between attacks, you usually will not have symptoms, which is different from many other forms of arthritis. DIAGNOSIS  Your caregiver will suspect gout based on your symptoms and exam. In some cases, tests may be recommended. The tests may include:  Blood tests.  Urine tests.  X-rays.  Joint fluid exam. This exam requires a needle to remove fluid from the joint (arthrocentesis). Using a microscope, gout is confirmed when uric acid crystals are seen in the joint fluid. TREATMENT  There are two phases to gout treatment: treating the sudden onset (acute) attack and preventing attacks (prophylaxis).  Treatment of an Acute Attack.  Medicines are used. These include anti-inflammatory medicines or steroid medicines.  An injection of steroid medicine into the affected joint is sometimes necessary.  The painful joint is rested. Movement can worsen the arthritis.  You may use warm or cold treatments on painful joints, depending which works best for you.  Treatment to Prevent Attacks.  If you suffer from frequent gout attacks, your caregiver may advise preventive medicine. These medicines are started after the acute attack subsides. These medicines either help your kidneys eliminate uric acid from your body or decrease your uric acid production. You may need to stay on these medicines for a very long time.  The early phase of treatment with preventive medicine can be associated with an increase in acute gout attacks. For this reason, during the first few months of treatment, your caregiver may also advise you to take medicines usually used for acute gout treatment. Be sure you  understand your caregiver's directions. Your caregiver may make several adjustments to your medicine dose before these medicines are effective.  Discuss dietary treatment with your caregiver or dietitian. Alcohol and drinks high in sugar and fructose and foods  such as meat, poultry, and seafood can increase uric acid levels. Your caregiver or dietitian can advise you on drinks and foods that should be limited. HOME CARE INSTRUCTIONS   Do not take aspirin to relieve pain. This raises uric acid levels.  Only take over-the-counter or prescription medicines for pain, discomfort, or fever as directed by your caregiver.  Rest the joint as much as possible. When in bed, keep sheets and blankets off painful areas.  Keep the affected joint raised (elevated).  Apply warm or cold treatments to painful joints. Use of warm or cold treatments depends on which works best for you.  Use crutches if the painful joint is in your leg.  Drink enough fluids to keep your urine clear or pale yellow. This helps your body get rid of uric acid. Limit alcohol, sugary drinks, and fructose drinks.  Follow your dietary instructions. Pay careful attention to the amount of protein you eat. Your daily diet should emphasize fruits, vegetables, whole grains, and fat-free or low-fat milk products. Discuss the use of coffee, vitamin C, and cherries with your caregiver or dietitian. These may be helpful in lowering uric acid levels.  Maintain a healthy body weight. SEEK MEDICAL CARE IF:   You develop diarrhea, vomiting, or any side effects from medicines.  You do not feel better in 24 hours, or you are getting worse. SEEK IMMEDIATE MEDICAL CARE IF:   Your joint becomes suddenly more tender, and you have chills or a fever. MAKE SURE YOU:   Understand these instructions.  Will watch your condition.  Will get help right away if you are not doing well or get worse.   This information is not intended to replace advice given to you by your health care provider. Make sure you discuss any questions you have with your health care provider.   Document Released: 05/20/2000 Document Revised: 06/13/2014 Document Reviewed: 01/04/2012 Elsevier Interactive Patient Education 2016  Else[employer]  Knee Arthrocentesis Knee arthrocentesis is a procedure to remove fluid from your knee joint. The knee joint is a closed space that is lined by a membrane (synovial membrane). This membrane makes fluid to lubricate your joint, but sometimes this space fills with blood or other fluids. This can cause pain, swelling, and stiffness in your knee. You may have this procedure to remove fluid from a swollen knee or to get a sample of knee fluid for testing. Testing your knee fluid can help your health care provider to determine the cause of your knee pain or swelling. It can also help your health care provider to diagnose diseases such as gout, arthritis, and infections. Removing excess fluid from your knee may also be done to relieve your symptoms. LET [facility] CARE PROVIDER KNOW ABOUT:  Any allergies you have.  All medicines you are taking, including vitamins, herbs, eye drops, creams, and over-the-counter medicines.  Previous problems you or members of your family have had with the use of anesthetics.  Any blood disorders you have.  Previous surgeries you have had.  Any medical conditions you may have. RISKS AND COMPLICATIONS Generally, this is a safe procedure. However, problems may occur, including:  Infection.  Bleeding.  Bruising.  Swelling.  Damage to your knee joint.  An allergic reaction to the numbing  medicine. BEFORE THE PROCEDURE  Ask your health care provider about changing or stopping your regular medicines. This is especially important if you are taking diabetes medicines or blood thinners.  Plan to have someone take you home after the procedure. PROCEDURE  You will sit or lie down in a position for your knee to be treated.  Your knee will be cleaned with a germ-killing solution (antiseptic).  You will be given a medicine that numbs the area (local anesthetic). You may feel some stinging.  After your knee becomes numb, a health care provider will  insert a long, thin needle into the side of your knee joint. You may feel some pressure.  Your health care provider will pull back the syringe on the needle to remove fluid. Your health care provider may press on your knee to help remove the fluid.  At the end of the procedure, the needle will be removed.  A bandage (dressing) will be placed over the puncture site. The procedure may vary among health care providers and hospitals. AFTER THE PROCEDURE  Your blood pressure, heart rate, breathing rate, and blood oxygen level will be monitored often until the medicines you were given have worn off.   This information is not intended to replace advice given to you by your health care provider. Make sure you discuss any questions you have with your health care provider.   Document Released: 08/11/2006 Document Revised: 06/13/2014 Document Reviewed: 04/02/2014 Elsevier Interactive Patient Education [employer].

## 2015-06-11 ENCOUNTER — Encounter (HOSPITAL_COMMUNITY): Payer: Self-pay | Admitting: *Deleted

## 2015-06-11 ENCOUNTER — Inpatient Hospital Stay (HOSPITAL_COMMUNITY)
Admission: EM | Admit: 2015-06-11 | Discharge: 2015-06-14 | DRG: 554 | Disposition: A | Discharge status: Home / Self Care | Payer: Medicare Other | Attending: Internal Medicine | Admitting: Internal Medicine

## 2015-06-11 ENCOUNTER — Ambulatory Visit: Payer: Medicare Other | Admitting: Cardiothoracic Surgery

## 2015-06-11 DIAGNOSIS — I10 Essential (primary) hypertension: Secondary | ICD-10-CM | POA: Diagnosis not present

## 2015-06-11 DIAGNOSIS — Z7982 Long term (current) use of aspirin: Secondary | ICD-10-CM | POA: Diagnosis not present

## 2015-06-11 DIAGNOSIS — M109 Gout, unspecified: Secondary | ICD-10-CM | POA: Diagnosis present

## 2015-06-11 DIAGNOSIS — I2510 Atherosclerotic heart disease of native coronary artery without angina pectoris: Secondary | ICD-10-CM | POA: Diagnosis present

## 2015-06-11 DIAGNOSIS — M10062 Idiopathic gout, left knee: Secondary | ICD-10-CM | POA: Diagnosis not present

## 2015-06-11 DIAGNOSIS — I5032 Chronic diastolic (congestive) heart failure: Secondary | ICD-10-CM | POA: Diagnosis present

## 2015-06-11 DIAGNOSIS — G4733 Obstructive sleep apnea (adult) (pediatric): Secondary | ICD-10-CM | POA: Diagnosis present

## 2015-06-11 DIAGNOSIS — E8889 Other specified metabolic disorders: Secondary | ICD-10-CM | POA: Diagnosis present

## 2015-06-11 DIAGNOSIS — Z981 Arthrodesis status: Secondary | ICD-10-CM

## 2015-06-11 DIAGNOSIS — I480 Paroxysmal atrial fibrillation: Secondary | ICD-10-CM | POA: Diagnosis not present

## 2015-06-11 DIAGNOSIS — Z79899 Other long term (current) drug therapy: Secondary | ICD-10-CM

## 2015-06-11 DIAGNOSIS — Z789 Other specified health status: Secondary | ICD-10-CM

## 2015-06-11 DIAGNOSIS — E119 Type 2 diabetes mellitus without complications: Secondary | ICD-10-CM

## 2015-06-11 DIAGNOSIS — I110 Hypertensive heart disease with heart failure: Secondary | ICD-10-CM | POA: Diagnosis present

## 2015-06-11 DIAGNOSIS — Z7902 Long term (current) use of antithrombotics/antiplatelets: Secondary | ICD-10-CM

## 2015-06-11 DIAGNOSIS — Z951 Presence of aortocoronary bypass graft: Secondary | ICD-10-CM

## 2015-06-11 DIAGNOSIS — Z87891 Personal history of nicotine dependence: Secondary | ICD-10-CM

## 2015-06-11 DIAGNOSIS — I25119 Atherosclerotic heart disease of native coronary artery with unspecified angina pectoris: Secondary | ICD-10-CM | POA: Diagnosis present

## 2015-06-11 DIAGNOSIS — G250 Essential tremor: Secondary | ICD-10-CM | POA: Diagnosis present

## 2015-06-11 DIAGNOSIS — M25562 Pain in left knee: Secondary | ICD-10-CM | POA: Diagnosis present

## 2015-06-11 DIAGNOSIS — Z953 Presence of xenogenic heart valve: Secondary | ICD-10-CM

## 2015-06-11 LAB — CBC WITH DIFFERENTIAL/PLATELET
Basophils Absolute: 0 10*3/uL (ref 0.0–0.1)
Basophils Relative: 0 %
EOS PCT: 0 %
Eosinophils Absolute: 0 10*3/uL (ref 0.0–0.7)
HCT: 46.6 % (ref 39.0–52.0)
HEMOGLOBIN: 15.4 g/dL (ref 13.0–17.0)
LYMPHS ABS: 0.7 10*3/uL (ref 0.7–4.0)
LYMPHS PCT: 6 %
MCH: 28.2 pg (ref 26.0–34.0)
MCHC: 33 g/dL (ref 30.0–36.0)
MCV: 85.3 fL (ref 78.0–100.0)
Monocytes Absolute: 1.7 10*3/uL — ABNORMAL HIGH (ref 0.1–1.0)
Monocytes Relative: 15 %
Neutro Abs: 9.3 10*3/uL — ABNORMAL HIGH (ref 1.7–7.7)
Neutrophils Relative %: 79 %
PLATELETS: 171 10*3/uL (ref 150–400)
RBC: 5.46 MIL/uL (ref 4.22–5.81)
RDW: 15 % (ref 11.5–15.5)
WBC: 11.8 10*3/uL — AB (ref 4.0–10.5)

## 2015-06-11 LAB — GRAM STAIN

## 2015-06-11 LAB — BASIC METABOLIC PANEL
Anion gap: 12 (ref 5–15)
BUN: 15 mg/dL (ref 6–20)
CHLORIDE: 96 mmol/L — AB (ref 101–111)
CO2: 27 mmol/L (ref 22–32)
Calcium: 9.7 mg/dL (ref 8.9–10.3)
Creatinine, Ser: 1.08 mg/dL (ref 0.61–1.24)
GFR calc Af Amer: >60 mL/min (ref >60)
GFR calc non Af Amer: >60 mL/min (ref >60)
GLUCOSE: 136 mg/dL — AB (ref 65–99)
POTASSIUM: 3.6 mmol/L (ref 3.5–5.1)
Sodium: 135 mmol/L (ref 135–145)

## 2015-06-11 LAB — GLUCOSE, CAPILLARY: GLUCOSE-CAPILLARY: 166 mg/dL — AB (ref 65–99)

## 2015-06-11 LAB — BRAIN NATRIURETIC PEPTIDE: B Natriuretic Peptide: 180.8 pg/mL — ABNORMAL HIGH (ref 0.0–100.0)

## 2015-06-11 LAB — GLUCOSE, SYNOVIAL FLUID: Glucose, Synovial Fluid: 44 mg/dL

## 2015-06-11 LAB — PROTEIN, SYNOVIAL FLUID: Protein, Synovial Fluid: 4 g/dL — ABNORMAL HIGH (ref 1.0–3.0)

## 2015-06-11 MED ORDER — ONDANSETRON HCL 4 MG/2ML IJ SOLN
4.0000 mg | Freq: Once | INTRAMUSCULAR | Status: DC
  Filled 2015-06-11: qty 2

## 2015-06-11 MED ORDER — HYDROMORPHONE HCL 1 MG/ML IJ SOLN
1.0000 mg | Freq: Once | INTRAMUSCULAR | Status: AC
  Administered 2015-06-11: 1 mg via INTRAVENOUS
  Filled 2015-06-11: qty 1

## 2015-06-11 MED ORDER — HYDROMORPHONE HCL 1 MG/ML IJ SOLN
1.5000 mg | INTRAMUSCULAR | Status: DC | PRN
  Administered 2015-06-11: 1.5 mg via INTRAVENOUS
  Administered 2015-06-13: 1 mg via INTRAVENOUS
  Filled 2015-06-11 (×2): qty 2

## 2015-06-11 MED ORDER — PRIMIDONE 50 MG PO TABS
50.0000 mg | ORAL_TABLET | Freq: Every day | ORAL | Status: DC
  Administered 2015-06-12 – 2015-06-13 (×2): 50 mg via ORAL
  Filled 2015-06-11 (×4): qty 1

## 2015-06-11 MED ORDER — VITAMIN D 1000 UNITS PO TABS
2000.0000 [IU] | ORAL_TABLET | Freq: Every day | ORAL | Status: DC
  Administered 2015-06-12 – 2015-06-14 (×3): 2000 [IU] via ORAL
  Filled 2015-06-11 (×4): qty 2

## 2015-06-11 MED ORDER — SODIUM CHLORIDE 0.9 % IV SOLN: 250.0000 mL | INTRAVENOUS | Status: DC | PRN

## 2015-06-11 MED ORDER — HYDROMORPHONE HCL 2 MG PO TABS
2.0000 mg | ORAL_TABLET | Freq: Once | ORAL | Status: AC
  Administered 2015-06-11: 2 mg via ORAL
  Filled 2015-06-11: qty 1

## 2015-06-11 MED ORDER — HYDRALAZINE HCL 20 MG/ML IJ SOLN: 10.0000 mg | INTRAMUSCULAR | Status: DC | PRN

## 2015-06-11 MED ORDER — CLOPIDOGREL BISULFATE 75 MG PO TABS
75.0000 mg | ORAL_TABLET | Freq: Every day | ORAL | Status: DC
  Administered 2015-06-11 – 2015-06-14 (×4): 75 mg via ORAL
  Filled 2015-06-11 (×4): qty 1

## 2015-06-11 MED ORDER — METHYLPREDNISOLONE SODIUM SUCC 125 MG IJ SOLR
60.0000 mg | Freq: Two times a day (BID) | INTRAMUSCULAR | Status: DC
  Administered 2015-06-11 – 2015-06-13 (×4): 60 mg via INTRAVENOUS
  Filled 2015-06-11 (×4): qty 2

## 2015-06-11 MED ORDER — ONDANSETRON HCL 4 MG/2ML IJ SOLN: 4.0000 mg | Freq: Four times a day (QID) | INTRAMUSCULAR | Status: DC | PRN

## 2015-06-11 MED ORDER — FAMOTIDINE 20 MG PO TABS
20.0000 mg | ORAL_TABLET | Freq: Two times a day (BID) | ORAL | Status: DC
  Filled 2015-06-11: qty 1

## 2015-06-11 MED ORDER — HYDROCODONE-ACETAMINOPHEN 10-325 MG PO TABS: 1.0000 | ORAL_TABLET | Freq: Four times a day (QID) | ORAL | Status: DC | PRN

## 2015-06-11 MED ORDER — SODIUM CHLORIDE 0.9 % IJ SOLN
3.0000 mL | Freq: Two times a day (BID) | INTRAMUSCULAR | Status: DC
  Administered 2015-06-11 – 2015-06-13 (×4): 3 mL via INTRAVENOUS

## 2015-06-11 MED ORDER — ALLOPURINOL 100 MG PO TABS
100.0000 mg | ORAL_TABLET | Freq: Two times a day (BID) | ORAL | Status: DC
  Administered 2015-06-11 – 2015-06-14 (×6): 100 mg via ORAL
  Filled 2015-06-11 (×6): qty 1

## 2015-06-11 MED ORDER — ENOXAPARIN SODIUM 40 MG/0.4ML ~~LOC~~ SOLN
40.0000 mg | SUBCUTANEOUS | Status: DC
Start: 2015-06-11 — End: 2015-06-14
  Filled 2015-06-11 (×2): qty 0.4

## 2015-06-11 MED ORDER — AMIODARONE HCL 200 MG PO TABS
200.0000 mg | ORAL_TABLET | Freq: Every day | ORAL | Status: DC
  Administered 2015-06-12: 200 mg via ORAL
  Filled 2015-06-11 (×2): qty 1

## 2015-06-11 MED ORDER — ASPIRIN EC 81 MG PO TBEC
81.0000 mg | DELAYED_RELEASE_TABLET | Freq: Every day | ORAL | Status: DC
  Administered 2015-06-12 – 2015-06-14 (×3): 81 mg via ORAL
  Filled 2015-06-11 (×3): qty 1

## 2015-06-11 MED ORDER — INSULIN ASPART 100 UNIT/ML ~~LOC~~ SOLN
0.0000 [IU] | Freq: Three times a day (TID) | SUBCUTANEOUS | Status: DC
  Administered 2015-06-12 (×3): 5 [IU] via SUBCUTANEOUS

## 2015-06-11 MED ORDER — CURCUMAX PRO PO TABS: 1.0000 | ORAL_TABLET | Freq: Two times a day (BID) | ORAL | Status: DC

## 2015-06-11 MED ORDER — VITAMIN C 500 MG PO TABS
1000.0000 mg | ORAL_TABLET | Freq: Every day | ORAL | Status: DC
  Administered 2015-06-12 – 2015-06-14 (×3): 1000 mg via ORAL
  Filled 2015-06-11 (×3): qty 2

## 2015-06-11 MED ORDER — ACETAMINOPHEN 325 MG PO TABS: 650.0000 mg | ORAL_TABLET | ORAL | Status: DC | PRN

## 2015-06-11 MED ORDER — ROSUVASTATIN CALCIUM 10 MG PO TABS
10.0000 mg | ORAL_TABLET | Freq: Every day | ORAL | Status: DC
  Administered 2015-06-12 – 2015-06-14 (×3): 10 mg via ORAL
  Filled 2015-06-11 (×3): qty 1

## 2015-06-11 MED ORDER — AMLODIPINE BESYLATE 10 MG PO TABS
10.0000 mg | ORAL_TABLET | Freq: Every day | ORAL | Status: DC
  Administered 2015-06-12 – 2015-06-14 (×3): 10 mg via ORAL
  Filled 2015-06-11 (×3): qty 1

## 2015-06-11 MED ORDER — SODIUM CHLORIDE 0.9 % IJ SOLN: 3.0000 mL | INTRAMUSCULAR | Status: DC | PRN

## 2015-06-11 MED ORDER — PRIMIDONE 50 MG PO TABS
100.0000 mg | ORAL_TABLET | Freq: Every morning | ORAL | Status: DC
  Administered 2015-06-12 – 2015-06-14 (×3): 100 mg via ORAL
  Filled 2015-06-11 (×3): qty 2

## 2015-06-11 MED ORDER — VITAMIN B-12 1000 MCG PO TABS
2000.0000 ug | ORAL_TABLET | Freq: Every day | ORAL | Status: DC
  Administered 2015-06-12 – 2015-06-14 (×3): 2000 ug via ORAL
  Filled 2015-06-11 (×3): qty 2

## 2015-06-11 NOTE — Progress Notes (Signed)
Received report on patient from Anderson Malta, [place] from ED. Awaiting pt to be transferred to 5W.

## 2015-06-11 NOTE — H&P (Signed)
[facility] History and Physical  Troy Banks AGT:[id number] DOB: 11-16-1940 DOA: 06/11/2015  Referring physician: ED physician PCP: Cathlean Cower, MD  Specialists: Dr. [name] (cardiology)  Chief Complaint: Intractable left knee pain with swelling and redness  HPI: Troy Banks is a 75 y.o. male with PMH of CAD status post recent 4 vessel CABG and bioprosthetic AVR, type 2 diabetes, chronic diastolic CHF, and gout who presents to the ED with 4 days of left knee swelling, redness, and severe pain. Patient underwent CABG and AV replacement on 03/30/2015 and had been rehabilitating quite well when he developed sudden onset, 10/10 in severity, left knee pain, waking him from sleep on 06/07/2015. He noted the need to be red and swollen at that time and was still able to ambulate, but with great difficulty and pain. There was some initial improvement the following day, but then re-worsening that has progressed up to the admission. Patient has history of gout and has been maintained on allopurinol twice daily with no acute flare in approximately 10 years. Patient had previously had the left first MTP involvement.  [name] denies any recent change in his diet, denies alcohol use, and endorses adherence with allopurinol. He was started on new medications following the CABG better been tolerating those well up to this point. He had some 2 mg Dilaudid tablets at home from the recent heart surgery and took those without any significant relief. By the time of his presentation, patient had been completely nonambulatory and unable to bear weight or flex the knee.  In ED, patient was found to Be afebrile, saturating well on room air and with vital signs stable. There was a leukocytosis to 12,000 on CBC, but initial blood work was largely unremarkable otherwise. The left knee was aspirated and fluid sent for culture, cell count, and crystal analysis. Intracellular monosodium urate crystals were  identified. Treatment is complicated in this case by the patient's cytochrome P450 variant.  Unable to control the patient's pain in the emergency department, the hospitalists were asked to admit for ongoing evaluation and management.  Where does patient live?   At home    Can patient participate in ADLs?  Yes        Review of Systems:   General: no fevers, chills, sweats, weight change, poor appetite, or fatigue HEENT: no blurry vision, hearing changes or sore throat Pulm: no dyspnea, cough, or wheeze CV: no chest pain or palpitations Abd: no nausea, vomiting, abdominal pain, diarrhea, or constipation GU: no dysuria, hematuria, increased urinary frequency, or urgency  Ext: Left leg swelling around knee, extending to ankle, with associated redness and severe pain  Neuro: no focal weakness, numbness, or tingling, no vision change or hearing loss Skin: no rash, no wounds MSK: No muscle spasm, no deformity. Left knee hot, red, swollen Heme: No easy bruising or bleeding Travel history: No recent long distant travel    Allergy:  Allergies  Allergen Reactions  . Benzodiazepines Other (See Comments)    Patient states he gets stroke symptoms with benzo's.   . Coreg [Carvedilol] Other (See Comments)    Patient poor metabolizer of CYP2D6 - Coreg undergoes extensive hepatic (including 2D6)  . Diazepam Other (See Comments)    Stroke sx  . Lisinopril Other (See Comments)    Pt states C450, and hyperkalemia  . Mobic [Meloxicam] Other (See Comments)    Patient states he gets stroke symptoms with benzo's.   . Sertraline Hcl Other (See Comments)  Pt states all meds that are broken down with C-450- if so have sxs of stroke  . Tramadol Other (See Comments)    Unable to take C450  . Atorvastatin Other (See Comments)    MYALGIAS  . Acyclovir And Related Other (See Comments)    Past Medical History  Diagnosis Date  . COLONIC POLYPS, HX OF   . DIABETES MELLITUS     diet controlled  .  DIVERTICULOSIS, COLON   . GOUT   . NEPHROLITHIASIS, HX OF     remote open stone-retrieval on L  . HYPERTENSION     off ACEI 2010 because of hyperkalemia in setting of ARI  . OSA on CPAP   . Cervical stenosis of spinal canal     fusion 2006  . Familial tremor 03/19/2014  . 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 Aortic stenosis, moderate 03/27/2015    post AVR echo Echo 11/16: Mild LVH, EF 55-60%, normal wall motion, grade 1 diastolic dysfunction, bioprosthetic AVR okay (mean gradient 18 mmHg) with trivial AI, mild LAE, atrial septal aneurysm, small effusion     Past Surgical History  Procedure Laterality Date  . Strangulated testicle  1960  . Left great toe Left x2- 1985 & 2004  . Anterior fusion cervical spine  2002  . Hernia repair  1962  . Kidney stones  1986  . Posterior fusion cervical spine  2006  . Cardiac catheterization N/A 03/26/2015    Procedure: Left Heart Cath and Coronary Angiography;  Surgeon: Leonie Man, MD;  Location: [place] [facility];  Service: Cardiovascular;  Laterality: N/A;  . Coronary artery bypass graft N/A 03/30/2015    Procedure: CORONARY ARTERY BYPASS GRAFTING (CABG) x four, using left internal mammary artery and left    leg greater saphenous vein harvested endoscopically ;  Surgeon: Grace Isaac, MD;  Location: [place];  Service: Open Heart Surgery;  Laterality: N/A;  . Aortic valve replacement N/A 03/30/2015    Procedure: AORTIC VALVE REPLACEMENT (AVR);  Surgeon: Grace Isaac, MD;  Location: [place];  Service: Open Heart Surgery;  Laterality: N/A;  . Tee without cardioversion N/A 03/30/2015    Procedure: TRANSESOPHAGEAL ECHOCARDIOGRAM (TEE);  Surgeon: Grace Isaac, MD;  Location: [place];  Service: Open Heart Surgery;  Laterality: N/A;    Social History:  reports that he quit smoking about 25 years ago. He does not have any smokeless tobacco history on file. He reports that he  drinks alcohol. He reports that he does not use illicit drugs.  Family History:  Family History  Problem Relation Age of Onset  . Heart disease Mother   . Hypertension Mother   . Heart disease Father   . Hypertension Father   . Prostate cancer Maternal Uncle   . Hypertension Maternal Grandmother   . Heart disease Maternal Grandmother   . Hypertension Maternal Grandfather   . Heart disease Maternal Grandfather   . Hypertension Paternal Grandfather   . Heart disease Paternal Grandfather   . Diabetes Paternal Grandfather   . Alcohol abuse Other   . Arthritis Other   . Diabetes Mother      Prior to Admission medications   Medication Sig Start Date End Date Taking? Authorizing Provider  allopurinol (ZYLOPRIM) 100 MG tablet Take 1 tablet (100 mg total) by mouth 2 (two) times daily. 12/17/14   Rowe Clack, MD  amiodarone (PACERONE) 200 MG tablet  TAKE 1 TABLET BY MOUTH EVERY DAY 06/02/15   Liliane Shi, [name]  amLODipine (NORVASC) 10 MG tablet Take 1 tablet (10 mg total) by mouth daily. 05/28/15   Rowe Clack, MD  Ascorbic Acid (VITAMIN C) 1000 MG tablet Take 1,000 mg by mouth daily.    Historical Provider, MD  aspirin 81 MG tablet Take 81 mg by mouth daily.    Historical Provider, MD  blood glucose meter kit and supplies KIT Use monitor to check blood sugar twice a day. DX E11.09 09/29/14   Rowe Clack, MD  Blood Glucose Monitoring Suppl (ONE TOUCH ULTRA 2) W/DEVICE KIT Test up to two times daily 09/29/14   Rowe Clack, MD  Cholecalciferol (VITAMIN D3) 2000 UNITS TABS Take 2,000 Units by mouth daily.    Historical Provider, MD  clopidogrel (PLAVIX) 75 MG tablet Take 1 tablet (75 mg total) by mouth daily. 04/22/15 04/21/16  Liliane Shi, [name]  cyanocobalamin 2000 MCG tablet Take 2,000 mcg by mouth daily.    Historical Provider, MD  furosemide (LASIX) 40 MG tablet Take 1 tablet (40 mg total) by mouth every other day. 05/04/15   Wayne E Gold, [name]  glucose blood  (ONE TOUCH TEST STRIPS) test strip Test up to two times daily 09/29/14   Rowe Clack, MD  HYDROmorphone (DILAUDID) 2 MG tablet Take 0.5-1 tablets (1-2 mg total) by mouth every 4 (four) hours as needed for severe pain. 04/06/15   Donielle Liston Alba, [name]  Misc Natural Products (CURCUMAX PRO) TABS Take 1 tablet by mouth 2 (two) times daily.    Historical Provider, MD  nabumetone (RELAFEN) 500 MG tablet Take 1 tablet (500 mg total) by mouth 2 (two) times daily as needed for moderate pain. 04/09/15   Rowe Clack, MD  ONE TOUCH LANCETS MISC Test up to two times daily 09/29/14   Rowe Clack, MD  primidone (MYSOLINE) 50 MG tablet Take two tablets in the morning, 1 in the evening 03/17/15   [name] [name], DO  primidone (MYSOLINE) 50 MG tablet Take 100 mg qam (2 pills) and 50 mg qpm (1 pill) 06/02/15   [name] [name], DO  ranitidine (ZANTAC) 150 MG tablet Take 1 tablet (150 mg total) by mouth 2 (two) times daily. 02/16/15   Rowe Clack, MD  rosuvastatin (CRESTOR) 10 MG tablet Take 1 tablet (10 mg total) by mouth daily. 04/24/15   [name], MD    Physical Exam: Filed Vitals:   06/11/15 1521  BP: 155/79  Pulse: 81  Temp: 98.9 F (37.2 C)  TempSrc: Oral  Resp: 20  SpO2: 92%   General: Not in acute distress HEENT:       Eyes: PERRL, EOMI, no scleral icterus or conjunctival pallor.       ENT: No discharge from the ears or nose, no pharyngeal ulcers, petechiae or exudate, no tonsillar enlargement.        Neck: No JVD, no bruit, no appreciable mass Heme: No cervical adenopathy, no pallor Cardiac: S1/S2, RRR, grade II early diastolic murmur at LSB, No gallops or rubs. Pulm: Good air movement bilaterally. No rales, wheezing, rhonchi or rubs. Abd: Soft, nondistended, nontender, no rebound pain or gaurding, no mass or organomegaly, BS present. Ext: Right LE without edema, erythema, or tenderness. Left knee red, warm, swollen; effusion present; swelling extends down to ankle  with faint erythema at medial calf and ankle; Left knee and ankle exquisitely tender and ROM/strength testing  severely limited by pt intolerance d/t pain. No wound or drainage. Vein harvest scars at medial left leg look well-healed without erythema or drainage Musculoskeletal: Aside from the left knee findings described above, no gross deformity, no red, hot, swollen joints, no limitation in ROM  Skin: No rashes or wounds on exposed surfaces  Neuro: Alert, oriented X3, cranial nerves II-XII grossly intact. No focal findings Psych: Patient is not overtly psychotic, appropriate mood and affect.  Labs on Admission:  Basic Metabolic Panel:  Recent Labs Lab 06/10/15 1640 06/11/15 1655  NA 138 135  K 4.2 3.6  CL 102 96*  CO2 25 27  GLUCOSE 138* 136*  BUN 13 15  CREATININE 0.89 1.08  CALCIUM 9.6 9.7   Liver Function Tests: No results for input(s): AST, ALT, ALKPHOS, BILITOT, PROT, ALBUMIN in the last 168 hours. No results for input(s): LIPASE, AMYLASE in the last 168 hours. No results for input(s): AMMONIA in the last 168 hours. CBC:  Recent Labs Lab 06/10/15 1640 06/11/15 1655  WBC 11.4* 11.8*  NEUTROABS 8.6* 9.3*  HGB 14.9 15.4  HCT 45.0 46.6  MCV 86.4 85.3  PLT 166 171   Cardiac Enzymes: No results for input(s): CKTOTAL, CKMB, CKMBINDEX, TROPONINI in the last 168 hours.  BNP (last 3 results) No results for input(s): BNP in the last 8760 hours.  ProBNP (last 3 results) No results for input(s): PROBNP in the last 8760 hours.  CBG: No results for input(s): GLUCAP in the last 168 hours.  Radiological Exams on Admission: Dg Knee Complete 4 Views Left  06/10/2015  CLINICAL DATA:  LEFT knee pain since CABG 3 months ago, soft tissue swelling with warmth at entire joint for 3 days, cannot bear weight, painful to touch, history gout EXAM: LEFT KNEE - COMPLETE 4+ VIEW COMPARISON:  None FINDINGS: Osseous demineralization. Diffuse joint space narrowing with chondrocalcinosis  question CPPD. No acute fracture, dislocation or bone destruction. No juxta-articular erosions. Probable knee joint effusion. Diffuse soft tissue swelling LEFT knee greatest anteriorly. Question calcific debris at the lateral recess. Atherosclerotic calcifications at the distal superficial femoral and popliteal arteries extending into trifurcation vessels. IMPRESSION: Osseous demineralization with degenerative changes and question CPPD. Large joint effusion with regional soft tissue swelling. No definite acute bony abnormalities. Electronically Signed   By: Lavonia Dana M.D.   On: 06/10/2015 17:22    EKG: Not performed in the ED, will obtain as appropriate.     Assessment/Plan  1. Acute gouty arthritis with intractable pain  - Involving Lt knee and ankle  - Intracellular monosodium urate crystals noted on exam of aspirate  - Septic joint can co-exist with acute gout, but suspect the mild leukocytosis is reflective of stress response rather than infxn as no organisms seen on micro and pt is afebrile with no systemic sxs; monitor, follow-up fluid culture  - Traditional treatments for acute gout precluded by pt's cytochrome P450 variant and intolerance of NSAIDs, colchicine  - Case was discussed with pharmacy in light of cytochrome p450 variant - Start treatment with Solu-Medrol 60 mg IV q12h; intraarticular injection considered, but with ankle involved now also, systemic treatment strategy chosen  - Pain-control has been difficult; trying to control with IV Dilaudid now, will convert to PO agents as appropriate; hesitant to start long-acting opiate as steroid may hopefully provide rapid relief   - As pt has been taking Allopurinol consistently, will continue it here to avoid worsening   2. Type II DM  - Diet-controlled at home,  A1c 6.0% in October '16, suggesting excellent control  - Check CBGs QID while on steroids, use SSI correctional as needed  - Will recheck A1c as recent worsening in glycemic  control may have been a factor in the acute gout flare    3. Hypertension  - At goal currently  - Hydralazine IV pushes prn SBP >180  - If requiring repeated PRNs, will add scheduled agent   4. Cytochrome p450 CYP2D6 variant, poor drug-metabolizer  - Many medications contraindicated in light of this - Case was discussed with pharmacy who agree with Solu-Medrol for treatment of gout flare as above   5. CAD s/p 4v CABG and bioprosthetic AVR (03/30/2015)  - Stable, no CP or palpitations, no edema  - Continue ASA 81, Plavix, Crestor, Pacerone  6. Chronic diastolic CHF   - Grade 1 diastolic dysfunction with preserved EF documented on TTE (04/14/2015) - Holding home Lasix tonight as pt euvolemic and under treatment for acute gout, will resume when appropriate  - Fluid-restrict to 1500cc, SLIV, daily wts, follow I/Os    DVT ppx:  SQ Lovenox      Code Status: Full code Family Communication:  Yes, patient's wife at bed side Disposition Plan: Admit to inpatient   Date of Service 06/11/2015    Vianne Bulls, MD [facility] Pager [phone]  If 7PM-7AM, please contact night-coverage [email or web address] Password [email or web address] 06/11/2015, 7:20 PM

## 2015-06-11 NOTE — ED Provider Notes (Signed)
CSN: [id number]     Arrival date & time 06/11/15  1427 History  By signing my name below, I, Evelene Croon, attest that this documentation has been prepared under the direction and in the presence of non-physician practitioner, Alecia Lemming [name]. Electronically Signed: Evelene Croon, Scribe. 06/11/2015. 3:47 PM.     Chief Complaint  Patient presents with  . Gout    The history is provided by the patient. No language interpreter was used.     HPI Comments:  Troy Banks is a 75 y.o. male with a history of CABG (03/30/2015) and gout, who presents to the Emergency Department complaining of throbbing left knee pain x ~ 4 days, worse today. He was seen in the ED yesterday for same and had the knee aspirated with moderate relief. He reports increased swelling and redness to the site today. He also reports inability to ambulate due to pain. Pt has taken dilaudid 41m oral tablets with little relief. He is currently on Allopurinol for his gout. His last flare up prior to today was years ago. He denies fever and vomiting.  Past Medical History  Diagnosis Date  . COLONIC POLYPS, HX OF   . DIABETES MELLITUS     diet controlled  . DIVERTICULOSIS, COLON   . GOUT   . NEPHROLITHIASIS, HX OF     remote open stone-retrieval on L  . HYPERTENSION     off ACEI 2010 because of hyperkalemia in setting of ARI  . OSA on CPAP   . Cervical stenosis of spinal canal     fusion 2006  . Familial tremor 03/19/2014  . 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 Aortic stenosis, moderate 03/27/2015    post AVR echo Echo 11/16: Mild LVH, EF 55-60%, normal wall motion, grade 1 diastolic dysfunction, bioprosthetic AVR okay (mean gradient 18 mmHg) with trivial AI, mild LAE, atrial septal aneurysm, small effusion    Past Surgical History  Procedure Laterality Date  . Strangulated testicle  1960  . Left great toe Left x2- 1985 & 2004  . Anterior fusion  cervical spine  2002  . Hernia repair  1962  . Kidney stones  1986  . Posterior fusion cervical spine  2006  . Cardiac catheterization N/A 03/26/2015    Procedure: Left Heart Cath and Coronary Angiography;  Surgeon: DLeonie Man MD;  [facility];  Service: Cardiovascular;  Laterality: N/A;  . Coronary artery bypass graft N/A 03/30/2015    Procedure: CORONARY ARTERY BYPASS GRAFTING (CABG) x four, using left internal mammary artery and left    leg greater saphenous vein harvested endoscopically ;  Surgeon: EGrace Isaac MD;  [facility]  Service: Open Heart Surgery;  Laterality: N/A;  . Aortic valve replacement N/A 03/30/2015    Procedure: AORTIC VALVE REPLACEMENT (AVR);  Surgeon: EGrace Isaac MD;  [facility]  Service: Open Heart Surgery;  Laterality: N/A;  . Tee without cardioversion N/A 03/30/2015    Procedure: TRANSESOPHAGEAL ECHOCARDIOGRAM (TEE);  Surgeon: EGrace Isaac MD;  [place] [facility]  Service: Open Heart Surgery;  Laterality: N/A;   Family History  Problem Relation Age of Onset  . Heart disease Mother   . Hypertension Mother   . Heart disease Father   . Hypertension Father   . Prostate cancer Maternal Uncle   . Hypertension Maternal Grandmother   . Heart disease Maternal Grandmother   .  Hypertension Maternal Grandfather   . Heart disease Maternal Grandfather   . Hypertension Paternal Grandfather   . Heart disease Paternal Grandfather   . Diabetes Paternal Grandfather   . Alcohol abuse Other   . Arthritis Other   . Diabetes Mother    Social History  Substance Use Topics  . Smoking status: Former Smoker    Quit date: 06/06/1990  . Smokeless tobacco: None     Comment: widowed 1991 - lives with SO [name] who is Therapist, sports  . Alcohol Use: Yes     Comment: glass of wine once a week    Review of Systems  Constitutional: Negative for fever.  HENT: Negative for rhinorrhea and sore throat.   Eyes: Negative for redness.  Respiratory:  Negative for cough.   Cardiovascular: Negative for chest pain.  Gastrointestinal: Negative for nausea, vomiting, abdominal pain and diarrhea.  Genitourinary: Negative for dysuria.  Musculoskeletal: Positive for joint swelling and arthralgias. Negative for myalgias.       Left knee  Skin: Negative for rash.  Neurological: Negative for headaches.    Allergies  Benzodiazepines; Coreg; Diazepam; Lisinopril; Mobic; Sertraline hcl; Tramadol; Atorvastatin; and Acyclovir and related  Home Medications   Prior to Admission medications   Medication Sig Start Date End Date Taking? Authorizing Provider  allopurinol (ZYLOPRIM) 100 MG tablet Take 1 tablet (100 mg total) by mouth 2 (two) times daily. 12/17/14   Rowe Clack, MD  amiodarone (PACERONE) 200 MG tablet TAKE 1 TABLET BY MOUTH EVERY DAY 06/02/15   Liliane Shi, [name]  amLODipine (NORVASC) 10 MG tablet Take 1 tablet (10 mg total) by mouth daily. 05/28/15   Rowe Clack, MD  Ascorbic Acid (VITAMIN C) 1000 MG tablet Take 1,000 mg by mouth daily.    Historical Provider, MD  aspirin 81 MG tablet Take 81 mg by mouth daily.    Historical Provider, MD  blood glucose meter kit and supplies KIT Use monitor to check blood sugar twice a day. DX E11.09 09/29/14   Rowe Clack, MD  Blood Glucose Monitoring Suppl (ONE TOUCH ULTRA 2) W/DEVICE KIT Test up to two times daily 09/29/14   Rowe Clack, MD  Cholecalciferol (VITAMIN D3) 2000 UNITS TABS Take 2,000 Units by mouth daily.    Historical Provider, MD  clopidogrel (PLAVIX) 75 MG tablet Take 1 tablet (75 mg total) by mouth daily. 04/22/15 04/21/16  Liliane Shi, [name]  cyanocobalamin 2000 MCG tablet Take 2,000 mcg by mouth daily.    Historical Provider, MD  furosemide (LASIX) 40 MG tablet Take 1 tablet (40 mg total) by mouth every other day. 05/04/15   Wayne E Gold, [name]  glucose blood (ONE TOUCH TEST STRIPS) test strip Test up to two times daily 09/29/14   Rowe Clack, MD   HYDROmorphone (DILAUDID) 2 MG tablet Take 0.5-1 tablets (1-2 mg total) by mouth every 4 (four) hours as needed for severe pain. 04/06/15   Donielle Liston Alba, [name]  Misc Natural Products (CURCUMAX PRO) TABS Take 1 tablet by mouth 2 (two) times daily.    Historical Provider, MD  nabumetone (RELAFEN) 500 MG tablet Take 1 tablet (500 mg total) by mouth 2 (two) times daily as needed for moderate pain. 04/09/15   Rowe Clack, MD  ONE TOUCH LANCETS MISC Test up to two times daily 09/29/14   Rowe Clack, MD  primidone (MYSOLINE) 50 MG tablet Take two tablets in the morning, 1 in the evening 03/17/15  [name] [name], DO  primidone (MYSOLINE) 50 MG tablet Take 100 mg qam (2 pills) and 50 mg qpm (1 pill) 06/02/15   [name] [name], DO  ranitidine (ZANTAC) 150 MG tablet Take 1 tablet (150 mg total) by mouth 2 (two) times daily. 02/16/15   Rowe Clack, MD  rosuvastatin (CRESTOR) 10 MG tablet Take 1 tablet (10 mg total) by mouth daily. 04/24/15   [name], MD   BP 155/79 mmHg  Pulse 81  Temp(Src) 98.9 F (37.2 C) (Oral)  Resp 20  SpO2 92% Physical Exam  Constitutional: He is oriented to person, place, and time. He appears well-developed and well-nourished. No distress.  HENT:  Head: Normocephalic and atraumatic.  Eyes: Conjunctivae are normal. Right eye exhibits no discharge. Left eye exhibits no discharge.  Neck: Normal range of motion. Neck supple.  Cardiovascular: Normal rate and regular rhythm.   Murmur (2/6 upper left sternal border, systolic) heard. Pulmonary/Chest: Effort normal and breath sounds normal.  Abdominal: Soft. He exhibits no distension. There is no tenderness.  Musculoskeletal:       Left hip: Normal.       Left knee: He exhibits decreased range of motion, swelling, effusion and erythema. Tenderness found.       Left ankle: Normal.       Left upper leg: Normal.       Left lower leg: He exhibits tenderness and swelling.       Legs:      Left foot:  Normal.  Neurological: He is alert and oriented to person, place, and time.  Skin: Skin is warm and dry.  Psychiatric: He has a normal mood and affect.  Nursing note and vitals reviewed.   ED Course  Procedures   DIAGNOSTIC STUDIES:  Oxygen Saturation is 92% on RA, low by my interpretation.    COORDINATION OF CARE:  3:46 PM Will order pain meds. Pt informed of potential admission if symptoms do not improve in the ED. Discussed treatment plan with pt and wife at bedside and pt agreed to plan.  Labs Review Labs Reviewed - No data to display  Imaging Review Dg Knee Complete 4 Views Left  06/10/2015  CLINICAL DATA:  LEFT knee pain since CABG 3 months ago, soft tissue swelling with warmth at entire joint for 3 days, cannot bear weight, painful to touch, history gout EXAM: LEFT KNEE - COMPLETE 4+ VIEW COMPARISON:  None FINDINGS: Osseous demineralization. Diffuse joint space narrowing with chondrocalcinosis question CPPD. No acute fracture, dislocation or bone destruction. No juxta-articular erosions. Probable knee joint effusion. Diffuse soft tissue swelling LEFT knee greatest anteriorly. Question calcific debris at the lateral recess. Atherosclerotic calcifications at the distal superficial femoral and popliteal arteries extending into trifurcation vessels. IMPRESSION: Osseous demineralization with degenerative changes and question CPPD. Large joint effusion with regional soft tissue swelling. No definite acute bony abnormalities. Electronically Signed   By: Lavonia Dana M.D.   On: 06/10/2015 17:22   I have personally reviewed and evaluated these images and lab results as part of my medical decision-making.   EKG Interpretation None      Vital signs reviewed and are as follows: Filed Vitals:   06/11/15 1521  BP: 155/79  Pulse: 81  Temp: 98.9 F (37.2 C)  Resp: 20   Patient seen and discussed with Dr. Regenia Skeeter.   Spent time talking to her pharmacist regarding which steroids may be  used inpatient case. It appears that both Solu-Medrol and Solu-Cortef both have  minor metabolites which are metabolized through cytochrome P450 pathway.  Will admit for further treatment. Labs reviewed and are not significantly different than labs performed yesterday.  6:48 PM Spoke with Dr. Myna Hidalgo who will see and admit.   Patients pain is currently controlled.  MDM   Final diagnoses:  Gouty arthritis   Admit.  I personally performed the services described in this documentation, which was scribed in my presence. The recorded information has been reviewed and is accurate.    Carlisle Cater, [name] 06/11/15 [place], MD 06/12/15 [phone]

## 2015-06-11 NOTE — Progress Notes (Signed)
Patient's wife concern about patient having stroke due to history of poor medication metabolizers, stating "He's not his normal self." Upon assessment, patient is alert and oriented x4 with some lethargy and delayed response. Rapid response nurse, April, was called. April, RN at bedside, assessed patient. Patient is neurologically intact. Denies chest pain, shortness of breath. Denies numbness, tingling, weakness. Complains of pain on left leg, limited movement on left leg, which is the reason he was admitted. On call MD, Rogue Bussing was contacted. Will continue to monitor.

## 2015-06-11 NOTE — ED Notes (Signed)
Attempted to give report 

## 2015-06-11 NOTE — Significant Event (Signed)
Rapid Response Event Note Asked to see pt by primary RN because pt's wife had concerns with pt due to lethargy and slow to answer questions.  Pt was admitted for gout flare and pain control.  He had a arthroscopy with synovial fluid collection yesterday per the wife and has been running low grade temps since that time.  The wife is concerned because he is sleepy and slow to answer questions.  Of note pt recvd 2.5mg  IV dilaudid in the ED and does not take pain meds at home. Overview: Time Called: 0907 Event Type: Neurologic  Initial Focused Assessment: On assessment the pt is easily aroused and answers questions appropriately, Ox4, as well as f/c.  NIH 0.  Discussed with pt's wife that he does appear sleepy but his exam is nonfocal.  Pt's wife states she is afraid he has a stroke or a PE though she is unable to vocalize what findings are making her feel this way.  He does wear a CPAP at home and I will get an order for that from the primary team.  With a recent procedure, elevated WBC, and low grade temp, will pan to increase VS to q4 and monitor closely.  Updated wife with POC and she was agreeable.  Interventions:   Event Summary: Updated Tylene Fantasia, NP   at    Outcome: Stayed in room and stabalized     Pierce Barocio Hedgecock

## 2015-06-11 NOTE — ED Notes (Signed)
Patient given crutches to assist in ambulation.  Patient taken to the waiting room in a wheelchair.

## 2015-06-11 NOTE — Progress Notes (Signed)
Pt has hx of OSA, wears C-PAP @ home. Nonnie Done NP paged and notified for order for C-PAP.

## 2015-06-11 NOTE — Progress Notes (Signed)
NURSING PROGRESS NOTE  Troy Banks  MRN: [id number]  Admission Data: 06/11/2015 8:30 PM  Attending Provider: Vianne Bulls, MD  PCP: Cathlean Cower, MD  Code status: FULL  Allergies:  Allergies  Allergen Reactions  . Benzodiazepines Other (See Comments)    Patient states he gets stroke symptoms with benzo's.   . Coreg [Carvedilol] Other (See Comments)    Patient poor metabolizer of CYP2D6 - Coreg undergoes extensive hepatic (including 2D6)  . Diazepam Other (See Comments)    Stroke sx  . Lisinopril Other (See Comments)    Pt states C450, and hyperkalemia  . Mobic [Meloxicam] Other (See Comments)    Patient states he gets stroke symptoms with benzo's.   . Sertraline Hcl Other (See Comments)    Pt states all meds that are broken down with C-450- if so have sxs of stroke  . Tramadol Other (See Comments)    Unable to take C450  . Atorvastatin Other (See Comments)    MYALGIAS  . 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   Past Surgical History:  has past surgical history that includes Strangulated testicle (1960); Left great toe (Left, x2- 1985 & 2004); Anterior fusion cervical spine (2002); Hernia repair [phone]); Kidney stones (1986); Posterior fusion cervical spine (2006); Cardiac catheterization (N/A, 03/26/2015); Coronary artery bypass graft (N/A, 03/30/2015); Aortic valve replacement (N/A, 03/30/2015); and TEE without cardioversion (N/A, 03/30/2015).   Troy Banks is a 75 y.o. male patient, arrived to floor in room [phone] via stretcher, transferred from ED. Patient alert and oriented X 4, with some delayed response. No acute distress  noted. Complains of pain 6/10, describing as constant, pressure over left thigh, knee and lower leg.   Vital signs: Oral temperature 99.5 F (37.5 C), Blood pressure 148/65, Pulse 70, RR 20, SpO2 95 % on 2L oxygen, nasal cannula. Height 5'8" (172.7 cm), weight 219 lbs (99.3 kg).   Cardiac monitoring: Telemetry box 5W #  13 in place, yields 1st degree heart block. Tele box verified with Tom, NT.  IV access: Right forearm; condition patent and no redness.  Skin: intact, no pressure ulcer noted in sacral area. Redness, non-pitting swelling noted on left lower leg.  Patient's ID armband verified with patient/ family, and in place. Information packet given to patient/ family. Fall risk assessed, SR up X2, patient/ family able to verbalize understanding of risks associated with falls and to call nurse or staff to assist before getting out of bed. Patient/ family oriented to room and equipment. Call bell within reach.

## 2015-06-11 NOTE — Progress Notes (Addendum)
Pt refuses for this evening.

## 2015-06-11 NOTE — ED Notes (Signed)
Pt was tx here in ED and discharged at 1 am with acute gout of L knee.  Wife states 58 ML were drawn off of L knee, but swelling is increasing again along with new redness and warmth.  Also states she had to call 911 to help get pt into car to come back to ED.  States 2 mg po dilaudid did not touch pain.

## 2015-06-11 NOTE — ED Notes (Signed)
Undressed pt, gave wife belongings bag. Hospitalist bedside

## 2015-06-12 DIAGNOSIS — I2510 Atherosclerotic heart disease of native coronary artery without angina pectoris: Secondary | ICD-10-CM

## 2015-06-12 DIAGNOSIS — I480 Paroxysmal atrial fibrillation: Secondary | ICD-10-CM

## 2015-06-12 DIAGNOSIS — I10 Essential (primary) hypertension: Secondary | ICD-10-CM

## 2015-06-12 DIAGNOSIS — M109 Gout, unspecified: Secondary | ICD-10-CM

## 2015-06-12 DIAGNOSIS — E119 Type 2 diabetes mellitus without complications: Secondary | ICD-10-CM

## 2015-06-12 LAB — BASIC METABOLIC PANEL
ANION GAP: 11 (ref 5–15)
BUN: 17 mg/dL (ref 6–20)
CALCIUM: 9.3 mg/dL (ref 8.9–10.3)
CO2: 27 mmol/L (ref 22–32)
Chloride: 97 mmol/L — ABNORMAL LOW (ref 101–111)
Creatinine, Ser: 0.98 mg/dL (ref 0.61–1.24)
GFR calc Af Amer: >60 mL/min (ref >60)
GLUCOSE: 203 mg/dL — AB (ref 65–99)
Potassium: 4 mmol/L (ref 3.5–5.1)
Sodium: 135 mmol/L (ref 135–145)

## 2015-06-12 LAB — GLUCOSE, CAPILLARY
GLUCOSE-CAPILLARY: 211 mg/dL — AB (ref 65–99)
GLUCOSE-CAPILLARY: 230 mg/dL — AB (ref 65–99)
Glucose-Capillary: 222 mg/dL — ABNORMAL HIGH (ref 65–99)
Glucose-Capillary: 208 mg/dL — ABNORMAL HIGH (ref 65–99)

## 2015-06-12 MED ORDER — INSULIN ASPART 100 UNIT/ML ~~LOC~~ SOLN
0.0000 [IU] | Freq: Three times a day (TID) | SUBCUTANEOUS | Status: DC
  Administered 2015-06-13 (×2): 5 [IU] via SUBCUTANEOUS
  Administered 2015-06-13: 3 [IU] via SUBCUTANEOUS
  Administered 2015-06-14: 2 [IU] via SUBCUTANEOUS

## 2015-06-12 MED ORDER — INSULIN ASPART 100 UNIT/ML ~~LOC~~ SOLN
0.0000 [IU] | Freq: Every day | SUBCUTANEOUS | Status: DC
  Administered 2015-06-12 – 2015-06-13 (×2): 2 [IU] via SUBCUTANEOUS

## 2015-06-12 MED ORDER — NON FORMULARY: Freq: Two times a day (BID) | Status: DC

## 2015-06-12 MED ORDER — RANITIDINE HCL 150 MG PO TABS
150.0000 mg | ORAL_TABLET | Freq: Two times a day (BID) | ORAL | Status: AC
  Administered 2015-06-12 – 2015-06-13 (×3): 150 mg via ORAL
  Filled 2015-06-12 (×3): qty 1

## 2015-06-12 NOTE — Progress Notes (Signed)
Patient more alert now, lying comfortably in bed, feeling better with pain, rated pain 2/10 at the moment. No other needs expressed by patient.

## 2015-06-12 NOTE — Plan of Care (Signed)
Problem: Pain Managment: Goal: General experience of comfort will improve Outcome: Progressing Patient stated feeling better with pain.  Problem: Activity: Goal: Risk for activity intolerance will decrease Outcome: Progressing Patient able to move left leg better today, compared when he first came in.

## 2015-06-12 NOTE — Progress Notes (Signed)
Patient stated that he wears CPAP with nasal pillows at home. He says he has attempted to wear our nasal and FFM mask before and does not tolerate them. Patient said he will get his home nasal pillows and wear them tomorrow night. RT made patient aware that if he changed his mind to call and we will set a CPAP up for him.

## 2015-06-12 NOTE — Progress Notes (Signed)
Rogue Bussing MD paged and notified about patient's CBG 230.

## 2015-06-12 NOTE — Progress Notes (Signed)
Respiratory therapist, April, was called to assist patient with C-PAP set-up.

## 2015-06-12 NOTE — Consult Note (Signed)
CARDIOLOGY CONSULT NOTE   Patient ID: Troy Banks MRN: [id number], DOB/AGE: 73-Sep-1942   Admit date: 06/11/2015 Date of Consult: 06/12/2015 Reason for Consult: Palpitations   Primary Physician: Cathlean Cower, MD Primary Cardiologist: Dr. [name]  HPI: Troy Banks is a 75 y.o. 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  He has remained afebrile. Initial WBC was 12,000. Aspiration was performed of the left knee and cultures are pending. The aspirate did contain urate crystals, therefore he was admitted for treatment of his acute gouty arthritis. Due to his chromosomal variant, he cannot be treated with NSAIDS or Colchicine, therefore he was started on IV Solu-Medrol. Cardiology is consulted today for "palpitations" yet the patient denies any palpitations. He was scheduled to see Kerin Ransom, [name] in the office today for possible drug interactions involving his Amiodarone and missed his appointment due to being hospitalized. The patient and his wife requested Cardiology see him while admitted.  While admitted for CABG back in 03/2015 he developed post-op atrial fibrillation and was placed on Amiodarone with return to NSR. He is still currently on Amiodarone 200mg  daily with the plan to discontinue the medication in 2-3 months if still in NSR. Also during that admission, he was started on Atenolol but had significant bradycardia, therefore it was discontinued. He was not placed on anticoagulation.  The patient reports he was informed by his personal pharmacist there is a drug interaction between his Primidone (taken for essential tremors) and the Amiodarone he was placed on, making the Primidone less  effective. The patient reports having increased tremors for the past month. He DENIES any palpitations since surgery and does not believe he has gone back into atrial fibrillation. Denies any chest pain, dyspnea, diaphoresis, nausea, or vomiting.   Problem List Past Medical History  Diagnosis Date  . COLONIC POLYPS, HX OF   . DIABETES MELLITUS     diet controlled  . DIVERTICULOSIS, COLON   . GOUT   . NEPHROLITHIASIS, HX OF     remote open stone-retrieval on L  . HYPERTENSION     off ACEI 2010 because of hyperkalemia in setting of ARI  . OSA on CPAP   . Cervical stenosis of spinal canal     fusion 2006  . Familial tremor 03/19/2014  . 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 Aortic stenosis, moderate 03/27/2015    post AVR echo Echo 11/16: Mild LVH, EF 55-60%, normal wall motion, grade 1 diastolic dysfunction, bioprosthetic AVR okay (mean gradient 18 mmHg) with trivial AI, mild LAE, atrial septal aneurysm, small effusion     Past Surgical History  Procedure Laterality Date  . Strangulated testicle  1960  . Left great toe Left x2- 1985 & 2004  . Anterior fusion cervical spine  2002  . Hernia repair  1962  . Kidney stones  1986  . Posterior fusion cervical spine  2006  . Cardiac catheterization N/A 03/26/2015    Procedure: Left Heart Cath and Coronary Angiography;  Surgeon: Leonie Man, MD;  Location: [facility];  Service: Cardiovascular;  Laterality: N/A;  . Coronary artery bypass graft N/A 03/30/2015  Procedure: CORONARY ARTERY BYPASS GRAFTING (CABG) x four, using left internal mammary artery and left    leg greater saphenous vein harvested endoscopically ;  Surgeon: Grace Isaac, MD;  Location: [facility];  Service: Open Heart Surgery;  Laterality: N/A;  . Aortic valve replacement N/A 03/30/2015    Procedure: AORTIC VALVE REPLACEMENT (AVR);  Surgeon: Grace Isaac, MD;  Location: [facility];  Service: Open  Heart Surgery;  Laterality: N/A;  . Tee without cardioversion N/A 03/30/2015    Procedure: TRANSESOPHAGEAL ECHOCARDIOGRAM (TEE);  Surgeon: Grace Isaac, MD;  Location: [facility];  Service: Open Heart Surgery;  Laterality: N/A;     Allergies  Allergies  Allergen Reactions  . Benzodiazepines Other (See Comments)    Patient states he gets stroke symptoms with benzo's.   . Coreg [Carvedilol] Other (See Comments)    Patient poor metabolizer of CYP2D6 - Coreg undergoes extensive hepatic (including 2D6)  . Diazepam Other (See Comments)    Stroke sx  . Lisinopril Other (See Comments)    Pt states C450, and hyperkalemia  . Mobic [Meloxicam] Other (See Comments)    Patient states he gets stroke symptoms with benzo's.   . Sertraline Hcl Other (See Comments)    Pt states all meds that are broken down with C-450- if so have sxs of stroke  . Tramadol Other (See Comments)    Unable to take C450  . Atorvastatin Other (See Comments)    MYALGIAS  . Acyclovir And Related Other (See Comments)      Inpatient Medications . allopurinol  100 mg Oral BID  . amiodarone  200 mg Oral Daily  . amLODipine  10 mg Oral Daily  . aspirin EC  81 mg Oral Daily  . cholecalciferol  2,000 Units Oral Daily  . clopidogrel  75 mg Oral Daily  . enoxaparin (LOVENOX) injection  40 mg Subcutaneous Q24H  . insulin aspart  0-15 Units Subcutaneous TID WC  . methylPREDNISolone (SOLU-MEDROL) injection  60 mg Intravenous Q12H  . primidone  100 mg Oral q morning - 10a  . primidone  50 mg Oral QHS  . ranitidine  150 mg Oral BID  . rosuvastatin  10 mg Oral Daily  . sodium chloride  3 mL Intravenous Q12H  . cyanocobalamin  2,000 mcg Oral Daily  . vitamin C  1,000 mg Oral Daily    Family History Family History  Problem Relation Age of Onset  . Heart disease Mother   . Hypertension Mother   . Heart disease Father   . Hypertension Father   . Prostate cancer Maternal Uncle   . Hypertension Maternal Grandmother   .  Heart disease Maternal Grandmother   . Hypertension Maternal Grandfather   . Heart disease Maternal Grandfather   . Hypertension Paternal Grandfather   . Heart disease Paternal Grandfather   . Diabetes Paternal Grandfather   . Alcohol abuse Other   . Arthritis Other   . Diabetes Mother      Social History Social History   Social History  . Marital Status: Significant Other    Spouse Name: N/A  . Number of Children: N/A  . Years of Education: N/A   Occupational History  . Retired Animal nutritionist    Social History Main Topics  . Smoking status: Former Smoker    Quit date: 06/06/1990  . Smokeless tobacco: Not on file     Comment: widowed 1991 - lives with SO [name] who is Therapist, sports  . Alcohol  Use: Yes     Comment: glass of wine once a week  . Drug Use: No  . Sexual Activity: Not on file   Other Topics Concern  . Not on file   Social History Narrative     Review of Systems General:  No chills, fever, night sweats or weight changes.  Cardiovascular:  No chest pain, dyspnea on exertion, edema, orthopnea, palpitations, paroxysmal nocturnal dyspnea. Dermatological: No rash, lesions/masses Respiratory: No cough, dyspnea Urologic: No hematuria, dysuria Abdominal:   No nausea, vomiting, diarrhea, bright red blood per rectum, melena, or hematemesis Neurologic:  No visual changes, wkns, changes in mental status. Positive for tremor. MSK: Positive for left knee pain and swelling. All other systems reviewed and are otherwise negative except as noted above.  Physical Exam Blood pressure 139/68, pulse 72, temperature 98.1 F (36.7 C), temperature source Oral, resp. rate 19, height 5\' 8"  (1.727 m), weight 219 lb 3.2 oz (99.428 kg), SpO2 94 %.  General: Pleasant, Caucasian male appearing in NAD Psych: Normal affect. Neuro: Alert and oriented X 3. Moves all extremities spontaneously. HEENT: Normal  Neck: Supple without bruits or JVD. Lungs:  Resp regular and unlabored, CTA  without wheezing or rales. Heart: RRR no s3, s4, or murmurs. Abdomen: Soft, non-tender, non-distended, BS + x 4.  Extremities: No clubbing, cyanosis or edema. DP/PT/Radials 2+ and equal bilaterally. Left knee swollen.  Labs  No results for input(s): CKTOTAL, CKMB, TROPONINI in the last 72 hours. Lab Results  Component Value Date   WBC 11.8* 06/11/2015   HGB 15.4 06/11/2015   HCT 46.6 06/11/2015   MCV 85.3 06/11/2015   PLT 171 06/11/2015    Recent Labs Lab 06/12/15 0612  NA 135  K 4.0  CL 97*  CO2 27  BUN 17  CREATININE 0.98  CALCIUM 9.3  GLUCOSE 203*   Lab Results  Component Value Date   CHOL 184 03/26/2015   HDL 36* 03/26/2015   LDLCALC 119* 03/26/2015   TRIG 144 03/26/2015    Radiology/Studies Dg Knee Complete 4 Views Left: 06/10/2015  CLINICAL DATA:  LEFT knee pain since CABG 3 months ago, soft tissue swelling with warmth at entire joint for 3 days, cannot bear weight, painful to touch, history gout EXAM: LEFT KNEE - COMPLETE 4+ VIEW COMPARISON:  None FINDINGS: Osseous demineralization. Diffuse joint space narrowing with chondrocalcinosis question CPPD. No acute fracture, dislocation or bone destruction. No juxta-articular erosions. Probable knee joint effusion. Diffuse soft tissue swelling LEFT knee greatest anteriorly. Question calcific debris at the lateral recess. Atherosclerotic calcifications at the distal superficial femoral and popliteal arteries extending into trifurcation vessels. IMPRESSION: Osseous demineralization with degenerative changes and question CPPD. Large joint effusion with regional soft tissue swelling. No definite acute bony abnormalities. Electronically Signed   By: Lavonia Dana M.D.   On: 06/10/2015 17:22    ECG: Obtaining repeat EKG  ECHOCARDIOGRAM: 04/24/2015 Study Conclusions - Left ventricle: The cavity size was normal. Wall thickness was increased in a pattern of mild LVH. Systolic function was normal. The estimated ejection fraction  was in the range of 55% to 60%. Wall motion was normal; there were no regional wall motion abnormalities. Doppler parameters are consistent with abnormal left ventricular relaxation (grade 1 diastolic dysfunction). - Aortic valve: A bioprosthesis was present. There was trivial regurgitation. - Left atrium: The atrium was mildly dilated. - Atrial septum: There was an atrial septal aneurysm. - Pericardium, extracardiac: A small pericardial effusion was identified.  Impressions: - Normal LV systolic  function; grade 1 diastolic dysfunction; mild LVH; s/p AVR with trace AI and mean gradient of 18 mmHg; mild LAE; trace MR.  ASSESSMENT AND PLAN  1. Post-Op Atrial Fibrillation - occurred in 03/2015 following his CABG procedure. He was symptomatic at that time and converted on IV Amiodarone. Denies any repeat palpitations since. - he has been on Amiodarone 200mg  daily and reports good compliance with the medication - he notes his essential tremor has became worse since starting Amiodarone. He is on Primidone for his essential tremor. His pharmacist informed him there is an interaction between the two medications which can decrease their effectiveness. - I spoke with one of our Pharmacists here and she said they use the same substrate, which therefore means there could be less effectiveness of the medications. If he is having worsening tremors, likely the Amiodarone is out-competing the Primidone. She recommended either stopping the Amiodarone if we are able to do so vs. Increasing the Primidone dosage to 100mg  BID. MD to see the patient and address the need for stopping Amiodarone. - will obtain EKG to confirm NSR. On telemetry, there is no evidence of atrial fibrillation.   Signed, Erma Heritage, [name] 06/12/2015, 3:24 PM Pager: [phone]  I have examined the patient and reviewed assessment and plan and discussed with patient.  Agree with above as stated.  It has been  several months since the patient has had any atrial fibrillation. He is symptomatic with his atrial fibrillation. Given that the amiodarone is interfering with the therapeutic effect of primidone, we have 2 options. We could increase primidone or stopping amiodarone. Since it has been a while since he has had any atrial fibrillation and amiodarone was not intended to be a long-term medicine, will stop amiodarone. It will still take some time for the amiodarone to get out of his system fully. Hopefully, the primidone effect will increase. He also prefers this plan. If we increase primidone instead, this will expose him potentially to a different set of side effects.    Please call with any further questions.  Orville Mena S.

## 2015-06-12 NOTE — Progress Notes (Signed)
Patient has some difficulty voiding this morning. Bladder scan 912 mL. Patient then able to void 300 mL. Patient stated he has history of bladder spasm which might contribute to the difficulty voiding. Patient denies bladder discomfort/ pressure at the moment. RN advised patient to try and void again later and let day-shift RN to know if this problem continues. Patient also advised to talk to MD this morning regarding history of bladder spasm. Vicky, day-shift RN, made aware during hand-off report.

## 2015-06-12 NOTE — Progress Notes (Signed)
[facility] PROGRESS NOTE  Troy Banks [id number] DOB: 23-Mar-1941 DOA: 06/11/2015 PCP: Cathlean Cower, MD  Assessment/Plan: Left knee gouty arthritis: On IV steroids, taper as appropriate, the aspiration fluid cultures have been negative so far.  Plan to start PT in am.  He has cyt P450 VARIANT and is not tolerant to colchicine.  Pain control and continue allopurinol.    Type 2 DM: CBG (last 3)   Recent Labs  06/12/15 0747 06/12/15 1156 06/12/15 1646  GLUCAP 222* 208* 211*    Resume SSI.  HGBA1C PENDING.    Hypertension: better controlled     CADs/p CABG. PAF, currently on amiodarone, tele is NSR.  PT had question about amiodarone interactions with primidone, and supposed to see the cardiologist Dr Neita Garnet last Tuesday, but had missed his appt and wanted cardiology to see him this admission.. Cardiology consulted and recommended stopping the amiodarone.      Code Status: full code.  Family Communication: discussed with wife at bedside Disposition Plan: pending PT eval in am.    Consultants:  cardiology  Procedures: Left knee aspiration  On 1/4 Antibiotics:  none  HPI/Subjective: reports pain is better Wants to see his cardiologist.  Objective: Filed Vitals:   06/12/15 0425 06/12/15 1406  BP: 142/74 139/68  Pulse: 70 72  Temp: 98 F (36.7 C) 98.1 F (36.7 C)  Resp: 18 19    Intake/Output Summary (Last 24 hours) at 06/12/15 1738 Last data filed at 06/12/15 1218  Gross per 24 hour  Intake    420 ml  Output   1300 ml  Net   -880 ml   Filed Weights   06/11/15 2032 06/12/15 0600  Weight: 99.338 kg (219 lb) 99.428 kg (219 lb 3.2 oz)    Exam:   General:  Alert feeling better , on 2l it [place] [facility].   Cardiovascular: s1s2  Respiratory: ctab  Abdomen: soft non tender non distended  Musculoskeletal: no pedal edema, but left knee is tender and warm, able to move left lower extremity only a little, parallel to the ground.   Data  Reviewed: Basic Metabolic Panel:  Recent Labs Lab 06/10/15 1640 06/11/15 1655 06/12/15 0612  NA 138 135 135  K 4.2 3.6 4.0  CL 102 96* 97*  CO2 25 27 27   GLUCOSE 138* 136* 203*  BUN 13 15 17   CREATININE 0.89 1.08 0.98  CALCIUM 9.6 9.7 9.3   Liver Function Tests: No results for input(s): AST, ALT, ALKPHOS, BILITOT, PROT, ALBUMIN in the last 168 hours. No results for input(s): LIPASE, AMYLASE in the last 168 hours. No results for input(s): AMMONIA in the last 168 hours. CBC:  Recent Labs Lab 06/10/15 1640 06/11/15 1655  WBC 11.4* 11.8*  NEUTROABS 8.6* 9.3*  HGB 14.9 15.4  HCT 45.0 46.6  MCV 86.4 85.3  PLT 166 171   Cardiac Enzymes: No results for input(s): CKTOTAL, CKMB, CKMBINDEX, TROPONINI in the last 168 hours. BNP (last 3 results)  Recent Labs  06/11/15 1655  BNP 180.8*    ProBNP (last 3 results) No results for input(s): PROBNP in the last 8760 hours.  CBG:  Recent Labs Lab 06/11/15 2201 06/12/15 0747 06/12/15 1156 06/12/15 1646  GLUCAP 166* 222* 208* 211*    Recent Results (from the past 240 hour(s))  Culture, body fluid-bottle     Status: None (Preliminary result)   Collection Time: 06/10/15  9:53 PM  Result Value Ref Range Status   Specimen Description SYNOVIAL LEFT KNEE  Final   Special Requests BOTTLES DRAWN AEROBIC AND ANAEROBIC 10CC  Final   Culture NO GROWTH 2 DAYS  Final   Report Status PENDING  Incomplete  Gram stain     Status: None   Collection Time: 06/10/15  9:53 PM  Result Value Ref Range Status   Specimen Description SYNOVIAL LEFT KNEE  Final   Special Requests NONE  Final   Gram Stain   Final    MODERATE WBC PRESENT,BOTH PMN AND MONONUCLEAR NO ORGANISMS SEEN    Report Status 06/11/2015 FINAL  Final     Studies: No results found.  Scheduled Meds: . allopurinol  100 mg Oral BID  . amiodarone  200 mg Oral Daily  . amLODipine  10 mg Oral Daily  . aspirin EC  81 mg Oral Daily  . cholecalciferol  2,000 Units Oral Daily   . clopidogrel  75 mg Oral Daily  . enoxaparin (LOVENOX) injection  40 mg Subcutaneous Q24H  . insulin aspart  0-15 Units Subcutaneous TID WC  . methylPREDNISolone (SOLU-MEDROL) injection  60 mg Intravenous Q12H  . primidone  100 mg Oral q morning - 10a  . primidone  50 mg Oral QHS  . ranitidine  150 mg Oral BID  . rosuvastatin  10 mg Oral Daily  . sodium chloride  3 mL Intravenous Q12H  . cyanocobalamin  2,000 mcg Oral Daily  . 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

## 2015-06-12 NOTE — Progress Notes (Signed)
Utilization review completed.  

## 2015-06-13 LAB — CBC
HEMATOCRIT: 43.9 % (ref 39.0–52.0)
HEMOGLOBIN: 14.6 g/dL (ref 13.0–17.0)
MCH: 28.2 pg (ref 26.0–34.0)
MCHC: 33.3 g/dL (ref 30.0–36.0)
MCV: 84.9 fL (ref 78.0–100.0)
Platelets: 219 10*3/uL (ref 150–400)
RBC: 5.17 MIL/uL (ref 4.22–5.81)
RDW: 14.5 % (ref 11.5–15.5)
WBC: 13.2 10*3/uL — AB (ref 4.0–10.5)

## 2015-06-13 LAB — BASIC METABOLIC PANEL
ANION GAP: 11 (ref 5–15)
BUN: 24 mg/dL — ABNORMAL HIGH (ref 6–20)
CALCIUM: 9.3 mg/dL (ref 8.9–10.3)
CO2: 28 mmol/L (ref 22–32)
Chloride: 99 mmol/L — ABNORMAL LOW (ref 101–111)
Creatinine, Ser: 1.01 mg/dL (ref 0.61–1.24)
Glucose, Bld: 170 mg/dL — ABNORMAL HIGH (ref 65–99)
Potassium: 3.8 mmol/L (ref 3.5–5.1)
SODIUM: 138 mmol/L (ref 135–145)

## 2015-06-13 LAB — GLUCOSE, CAPILLARY
GLUCOSE-CAPILLARY: 202 mg/dL — AB (ref 65–99)
Glucose-Capillary: 200 mg/dL — ABNORMAL HIGH (ref 65–99)

## 2015-06-13 LAB — HEMOGLOBIN A1C
HEMOGLOBIN A1C: 5.8 % — AB (ref 4.8–5.6)
Mean Plasma Glucose: 120 mg/dL

## 2015-06-13 MED ORDER — SENNOSIDES-DOCUSATE SODIUM 8.6-50 MG PO TABS
2.0000 | ORAL_TABLET | Freq: Two times a day (BID) | ORAL | Status: DC
  Administered 2015-06-13 (×2): 2 via ORAL
  Filled 2015-06-13 (×2): qty 2

## 2015-06-13 MED ORDER — POLYETHYLENE GLYCOL 3350 17 G PO PACK
17.0000 g | PACK | Freq: Every day | ORAL | Status: DC
  Administered 2015-06-13: 17 g via ORAL
  Filled 2015-06-13: qty 1

## 2015-06-13 MED ORDER — PREDNISONE 50 MG PO TABS
60.0000 mg | ORAL_TABLET | Freq: Every day | ORAL | Status: DC
  Administered 2015-06-14: 60 mg via ORAL
  Filled 2015-06-13: qty 1

## 2015-06-13 NOTE — Progress Notes (Signed)
Pt refused his lovenox with the reason that unless his cardiologist advise him to take it, indication for the lovenox was explain to him but still refused it, will continue to monitor

## 2015-06-13 NOTE — Progress Notes (Signed)
[facility] PROGRESS NOTE  Troy Banks [id number] DOB: [birth year] DOA: 06/11/2015 PCP: Cathlean Cower, MD  Assessment/Plan: Left knee gouty arthritis: On IV steroids, taper as appropriate, the aspiration fluid cultures have been negative so far.  Plan to start PT in am.  He has cyt P450 VARIANT and is not tolerant to colchicine.  Pain control and continue allopurinol.  Will call on call ortho and discuss the resultsof the fluid cultures.  Type 2 DM: CBG (last 3)   Recent Labs  06/12/15 2206 06/13/15 1234 06/13/15 1712  GLUCAP 230* 200* 202*    Resume SSI.  HGBA1C is 5.8    Hypertension: better controlled     CADs/p CABG. PAF, currently on amiodarone, tele is NSR.  PT had question about amiodarone interactions with primidone, and supposed to see the cardiologist Dr Neita Garnet last Tuesday, but had missed his appt and wanted cardiology to see him this admission.. Cardiology consulted and recommended stopping the amiodarone. He has been NSR. D/ced tele.       Code Status: full code.  Family Communication: discussed with wife at bedside Disposition Plan: pending PT eval in am.    Consultants:  cardiology  Procedures: Left knee aspiration  On 1/4 Antibiotics:  none  HPI/Subjective: reports pain is better Wants to see his cardiologist.  Objective: Filed Vitals:   06/13/15 1017 06/13/15 1311  BP: 140/79 146/71  Pulse: 66 63  Temp:  97.7 F (36.5 C)  Resp:  16    Intake/Output Summary (Last 24 hours) at 06/13/15 2009 Last data filed at 06/13/15 1400  Gross per 24 hour  Intake   1590 ml  Output   1500 ml  Net     90 ml   Filed Weights   06/11/15 2032 06/12/15 0600 06/13/15 0500  Weight: 99.338 kg (219 lb) 99.428 kg (219 lb 3.2 oz) 95.391 kg (210 lb 4.8 oz)    Exam:   General:  Alert feeling better , on 2l it  Oxygen.   Cardiovascular: s1s2  Respiratory: ctab  Abdomen: soft non tender non distended  Musculoskeletal: no pedal  edema, but left knee is tender and warm, able to move left lower extremity only a little, parallel to the ground.   Data Reviewed: Basic Metabolic Panel:  Recent Labs Lab 06/10/15 1640 06/11/15 1655 06/12/15 0612 06/13/15 0700  NA 138 135 135 138  K 4.2 3.6 4.0 3.8  CL 102 96* 97* 99*  CO2 25 27 27 28   GLUCOSE 138* 136* 203* 170*  BUN 13 15 17  24*  CREATININE 0.89 1.08 0.98 1.01  CALCIUM 9.6 9.7 9.3 9.3   Liver Function Tests: No results for input(s): AST, ALT, ALKPHOS, BILITOT, PROT, ALBUMIN in the last 168 hours. No results for input(s): LIPASE, AMYLASE in the last 168 hours. No results for input(s): AMMONIA in the last 168 hours. CBC:  Recent Labs Lab 06/10/15 1640 06/11/15 1655 06/13/15 0700  WBC 11.4* 11.8* 13.2*  NEUTROABS 8.6* 9.3*  --   HGB 14.9 15.4 14.6  HCT 45.0 46.6 43.9  MCV 86.4 85.3 84.9  PLT 166 171 219   Cardiac Enzymes: No results for input(s): CKTOTAL, CKMB, CKMBINDEX, TROPONINI in the last 168 hours. BNP (last 3 results)  Recent Labs  06/11/15 1655  BNP 180.8*    ProBNP (last 3 results) No results for input(s): PROBNP in the last 8760 hours.  CBG:  Recent Labs Lab 06/12/15 1156 06/12/15 1646 06/12/15 2206 06/13/15 1234 06/13/15 1712  GLUCAP 208*  211* 230* 200* 202*    Recent Results (from the past 240 hour(s))  Culture, body fluid-bottle     Status: None (Preliminary result)   Collection Time: 06/10/15  9:53 PM  Result Value Ref Range Status   Specimen Description SYNOVIAL LEFT KNEE  Final   Special Requests BOTTLES DRAWN AEROBIC AND ANAEROBIC 10CC  Final   Culture NO GROWTH 3 DAYS  Final   Report Status PENDING  Incomplete  Gram stain     Status: None   Collection Time: 06/10/15  9:53 PM  Result Value Ref Range Status   Specimen Description SYNOVIAL LEFT KNEE  Final   Special Requests NONE  Final   Gram Stain   Final    MODERATE WBC PRESENT,BOTH PMN AND MONONUCLEAR NO ORGANISMS SEEN    Report Status 06/11/2015 FINAL   Final     Studies: No results found.  Scheduled Meds: . allopurinol  100 mg Oral BID  . amLODipine  10 mg Oral Daily  . aspirin EC  81 mg Oral Daily  . cholecalciferol  2,000 Units Oral Daily  . clopidogrel  75 mg Oral Daily  . enoxaparin (LOVENOX) injection  40 mg Subcutaneous Q24H  . insulin aspart  0-15 Units Subcutaneous TID WC  . insulin aspart  0-5 Units Subcutaneous QHS  . polyethylene glycol  17 g Oral Daily  . [START ON 06/14/2015] predniSONE  60 mg Oral QAC breakfast  . primidone  100 mg Oral q morning - 10a  . primidone  50 mg Oral QHS  . rosuvastatin  10 mg Oral Daily  . senna-docusate  2 tablet Oral BID  . sodium chloride  3 mL Intravenous Q12H  . cyanocobalamin  2,000 mcg Oral Daily  . 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

## 2015-06-14 LAB — BASIC METABOLIC PANEL
ANION GAP: 9 (ref 5–15)
BUN: 26 mg/dL — AB (ref 6–20)
CHLORIDE: 99 mmol/L — AB (ref 101–111)
CO2: 30 mmol/L (ref 22–32)
Calcium: 8.9 mg/dL (ref 8.9–10.3)
Creatinine, Ser: 1.07 mg/dL (ref 0.61–1.24)
GFR calc Af Amer: >60 mL/min (ref >60)
GLUCOSE: 151 mg/dL — AB (ref 65–99)
POTASSIUM: 4.3 mmol/L (ref 3.5–5.1)
Sodium: 138 mmol/L (ref 135–145)

## 2015-06-14 LAB — GLUCOSE, CAPILLARY
Glucose-Capillary: 134 mg/dL — ABNORMAL HIGH (ref 65–99)
Glucose-Capillary: 175 mg/dL — ABNORMAL HIGH (ref 65–99)

## 2015-06-14 MED ORDER — PREDNISONE 20 MG PO TABS: ORAL_TABLET | ORAL | Status: DC

## 2015-06-14 NOTE — Progress Notes (Signed)
Per patient request he prefer to have Dilaudid 1mg  instead of 1.5mg  MD on call [place] was informed on the phone, i gave him 1 mg per patient request

## 2015-06-14 NOTE — Progress Notes (Signed)
Nsg Discharge Note  Admit Date:  06/11/2015 Discharge date: 06/14/2015   [name] to be D/C'd Home per MD order.  AVS completed.  Copy for chart, and copy for patient signed, and dated. Patient/caregiver able to verbalize understanding.  Discharge Medication:   Medication List    STOP taking these medications        amiodarone 200 MG tablet  Commonly known as:  PACERONE      TAKE these medications        allopurinol 100 MG tablet  Commonly known as:  ZYLOPRIM  Take 1 tablet (100 mg total) by mouth 2 (two) times daily.     amLODipine 10 MG tablet  Commonly known as:  NORVASC  Take 1 tablet (10 mg total) by mouth daily.     aspirin 81 MG tablet  Take 81 mg by mouth daily.     blood glucose meter kit and supplies Kit  Use monitor to check blood sugar twice a day. DX E11.09     clopidogrel 75 MG tablet  Commonly known as:  PLAVIX  Take 1 tablet (75 mg total) by mouth daily.     CURCUMAX PRO Tabs  Take 1 tablet by mouth 2 (two) times daily.     cyanocobalamin 2000 MCG tablet  Take 2,000 mcg by mouth daily.     furosemide 40 MG tablet  Commonly known as:  LASIX  Take 1 tablet (40 mg total) by mouth every other day.     glucose blood test strip  Commonly known as:  ONE TOUCH TEST STRIPS  Test up to two times daily     HYDROmorphone 2 MG tablet  Commonly known as:  DILAUDID  Take 0.5-1 tablets (1-2 mg total) by mouth every 4 (four) hours as needed for severe pain.     nabumetone 500 MG tablet  Commonly known as:  RELAFEN  Take 1 tablet (500 mg total) by mouth 2 (two) times daily as needed for moderate pain.     ONE TOUCH LANCETS Misc  Test up to two times daily     ONE TOUCH ULTRA 2 w/Device Kit  Test up to two times daily     predniSONE 20 MG tablet  Commonly known as:  DELTASONE  Prednisone 40 mg daily for 4 days followed by  Prednisone 20 mg daily for 3 days .     primidone 50 MG tablet  Commonly known as:  MYSOLINE  Take two tablets in the  morning, 1 in the evening     primidone 50 MG tablet  Commonly known as:  MYSOLINE  Take 100 mg qam (2 pills) and 50 mg qpm (1 pill)     ranitidine 150 MG tablet  Commonly known as:  ZANTAC  Take 1 tablet (150 mg total) by mouth 2 (two) times daily.     rosuvastatin 10 MG tablet  Commonly known as:  CRESTOR  Take 1 tablet (10 mg total) by mouth daily.     vitamin C 1000 MG tablet  Take 1,000 mg by mouth daily.     Vitamin D3 2000 units Tabs  Take 2,000 Units by mouth daily.        Discharge Assessment: Filed Vitals:   06/14/15 0005 06/14/15 0510  BP: 141/73 136/73  Pulse: 64 53  Temp: 99 F (37.2 C) 97.6 F (36.4 C)  Resp: 16 16   Skin clean, dry and intact without evidence of skin break down, no evidence of skin  tears noted. IV catheter discontinued intact. Site without signs and symptoms of complications - no redness or edema noted at insertion site, patient denies c/o pain - only slight tenderness at site.  Dressing with slight pressure applied.  D/c Instructions-Education: Discharge instructions given to patient/family with verbalized understanding. D/c education completed with patient/family including follow up instructions, medication list, d/c activities limitations if indicated, with other d/c instructions as indicated by MD - patient able to verbalize understanding, all questions fully answered. Patient instructed to return to ED, call 911, or call MD for any changes in condition.  Patient escorted via [place], and D/C home via private auto.  Dayle Points, RN 06/14/2015 1:27 PM

## 2015-06-14 NOTE — Evaluation (Signed)
Physical Therapy Evaluation and Discharge Patient Details Name: Troy Banks MRN: [id number] DOB: Jan 19, 1941 Today's Date: 06/14/2015   History of Present Illness  Troy Banks is a 75 y.o. 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  Clinical Impression  Patient evaluated by Physical Therapy with no further acute PT needs identified. All education has been completed and the patient has no further questions. Demonstrates ability to safely ambulate and navigates steps without physical assistance. Pt report his pain has significantly improved since admission. Eager to return home. Has assistance from wife as needed. See below for any follow-up Physical Therapy or equipment needs. PT is signing off. Thank you for this referral.     Follow Up Recommendations No PT follow up    Equipment Recommendations  None recommended by PT (prefers to use his crutches at home)    Recommendations for Other Services       Precautions / Restrictions Precautions Precautions: None Restrictions Weight Bearing Restrictions: No      Mobility  Bed Mobility               General bed mobility comments: sitting EOB  Transfers Overall transfer level: Needs assistance Equipment used: Rolling walker (2 wheeled) Transfers: Sit to/from Stand Sit to Stand: Supervision         General transfer comment: supervision for safety. Guarded while rising but stable. VC for hand placement  Ambulation/Gait Ambulation/Gait assistance: Min guard Ambulation Distance (Feet): 175 Feet Assistive device: Rolling walker (2 wheeled) Gait Pattern/deviations: Step-through pattern;Decreased step length - right;Decreased stance time - left;Ataxic Gait velocity: decreased Gait velocity  interpretation: Below normal speed for age/gender General Gait Details: Moderately antalgtic gait pattern but improved as ambulatory distance increased. VC for symmetry of gait and UE use as needed for support but to limit pushing through triceps only due to recent heart surgery (sternal precautions). No overt loss of balance noted during bout.  Stairs Stairs: Yes Stairs assistance: Supervision Stair Management: One rail Left;Step to pattern;Sideways Number of Stairs: 4 General stair comments: Educated on safe navigation techniques using single rail on left with sideways approach. Performed safely without loss of balance and states he feels confident with this technique.  Wheelchair Mobility    Modified Rankin (Stroke Patients Only)       Balance Overall balance assessment: Needs assistance Sitting-balance support: No upper extremity supported;Feet supported Sitting balance-Leahy Scale: Normal     Standing balance support: No upper extremity supported Standing balance-Leahy Scale: Good                               Pertinent Vitals/Pain Pain Assessment: 0-10 Pain Score: 5  Pain Location: Lt knee Pain Descriptors / Indicators: Aching Pain Intervention(s): Monitored during session;Repositioned    Home Living Family/patient expects to be discharged to:: Private residence Living Arrangements: Spouse/significant other Available Help at Discharge: Family;Available 24 hours/day Type of Home: House Home Access: Stairs to enter Entrance Stairs-Rails: Psychiatric nurse of Steps: 4 Home Layout: One level Home Equipment: Crutches;Shower seat      Prior Function Level of Independence: Independent with assistive device(s)         Comments: began using crutches when knee became symptomatic. IND prior to this.  Hand Dominance   Dominant Hand: Left    Extremity/Trunk Assessment   Upper Extremity Assessment: Defer to OT evaluation            Lower Extremity Assessment: LLE deficits/detail   LLE Deficits / Details: Slight edema, pain with ROM and weight-bearing.     Communication   Communication: No difficulties  Cognition Arousal/Alertness: Awake/alert Behavior During Therapy: WFL for tasks assessed/performed Overall Cognitive Status: Within Functional Limits for tasks assessed                      General Comments      Exercises General Exercises - Lower Extremity Ankle Circles/Pumps: AROM;Left;10 reps;Seated Long Arc Quad: AROM;Left;10 reps;Seated      Assessment/Plan    PT Assessment Patent does not need any further PT services  PT Diagnosis Difficulty walking;Abnormality of gait;Acute pain   PT Problem List    PT Treatment Interventions     PT Goals (Current goals can be found in the Care Plan section) Acute Rehab PT Goals Patient Stated Goal: Go home today PT Goal Formulation: All assessment and education complete, DC therapy    Frequency     Barriers to discharge        Co-evaluation               End of Session   Activity Tolerance: Patient tolerated treatment well Patient left: in bed;with call bell/phone within reach Nurse Communication: Mobility status         Time: [id number] PT Time Calculation (min) (ACUTE ONLY): 24 min   Charges:   PT Evaluation $PT Eval Low Complexity: 1 Procedure PT Treatments $Gait Training: 8-22 mins   PT G CodesEllouise Banks 06/14/2015, 11:01 AM  Troy Banks, [place]

## 2015-06-15 ENCOUNTER — Encounter: Payer: Self-pay | Admitting: Family

## 2015-06-15 ENCOUNTER — Ambulatory Visit (INDEPENDENT_AMBULATORY_CARE_PROVIDER_SITE_OTHER): Payer: Medicare Other | Admitting: Family

## 2015-06-15 ENCOUNTER — Telehealth: Payer: Self-pay

## 2015-06-15 VITALS — BP 170/90 | HR 68 | Temp 97.9°F | Resp 18 | Ht 68.0 in | Wt 211.4 lb

## 2015-06-15 DIAGNOSIS — M1009 Idiopathic gout, multiple sites: Secondary | ICD-10-CM | POA: Diagnosis not present

## 2015-06-15 DIAGNOSIS — M109 Gout, unspecified: Secondary | ICD-10-CM

## 2015-06-15 LAB — CULTURE, BODY FLUID W GRAM STAIN -BOTTLE: Culture: NO GROWTH

## 2015-06-15 LAB — HEMOGLOBIN A1C
HEMOGLOBIN A1C: 5.9 % — AB (ref 4.8–5.6)
MEAN PLASMA GLUCOSE: 123 mg/dL

## 2015-06-15 LAB — CULTURE, BODY FLUID-BOTTLE

## 2015-06-15 MED ORDER — HYDROMORPHONE HCL 2 MG PO TABS: 1.0000 mg | ORAL_TABLET | ORAL | Status: DC | PRN

## 2015-06-15 MED ORDER — COLCHICINE 0.6 MG PO TABS: ORAL_TABLET | ORAL | Status: DC

## 2015-06-15 NOTE — Discharge Summary (Signed)
Physician Discharge Summary  Troy Banks [id number]:[id number] DOB: [birth year] DOA: 06/11/2015  PCP: Cathlean Cower, MD  Admit date: 06/11/2015 Discharge date: 06/14/2015  Time spent: 25 minutes  Recommendations for Outpatient Follow-up:  1. Follow up with PCP and orthopedics as recommended.    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   Filed Weights   06/12/15 0600 06/13/15 0500 06/14/15 0508  Weight: 99.428 kg (219 lb 3.2 oz) 95.391 kg (210 lb 4.8 oz) 98.1 kg (216 lb 4.3 oz)    History of present illness: Troy Banks is a 75 y.o. male with PMH of CAD status post recent 4 vessel CABG and bioprosthetic AVR, type 2 diabetes, chronic diastolic CHF, and gout who presents to the ED with 4 days of left knee swelling, redness, and severe painCharles D Banks is a 75 y.o. male with PMH of CAD status post recent 4 vessel CABG and bioprosthetic AVR, type 2 diabetes, chronic diastolic CHF, and gout who presents to the ED with 4 days of left knee swelling, redness, and severe pain. He was admitted for left gouty arthritis.  Hospital Course:  Left knee gouty arthritis: On IV steroids, taper as appropriate, the aspiration fluid cultures have been negative so far.   He has cyt P450 VARIANT and is not tolerant to colchicine.  Pain control and continue allopurinol.  Discharged on prednisone taper.  sinovial fluid cultures have been negative.   Type 2 DM: CBG (last 3)   Recent Labs (last 2 labs)      Recent Labs  06/12/15 2206 06/13/15 1234 06/13/15 1712  GLUCAP 230* 200* 202*      Resume SSI.  HGBA1C is  5.8    Hypertension: better controlled    CADs/p CABG. PAF, currently on amiodarone, tele is NSR.  PT had question about amiodarone interactions with primidone, and supposed to see the cardiologist Dr Neita Garnet last Tuesday, but had missed his appt and wanted cardiology to see him this admission.. Cardiology consulted and recommended stopping the amiodarone. He has been NSR. D/ced tele.        Procedures:  none  Consultations:  Discussed the results of the flud analysis with Dr Percell Miller  Cardiology.  Discharge Exam: Filed Vitals:   06/14/15 0005 06/14/15 0510  BP: 141/73 136/73  Pulse: 64 53  Temp: 99 F (37.2 C) 97.6 F (36.4 C)  Resp: 16 16    General: alert afebrile comfortable.  Cardiovascular: s1s2 Respiratory: ctab  Discharge Instructions   Discharge Instructions    Diet - low sodium heart healthy    Complete by:  As directed      Discharge instructions    Complete by:  As directed   Follow up with PCP in one week.  Follow up with orthopedics as recommended.          Discharge Medication List as of 06/14/2015  1:08 PM    START taking these medications   Details  predniSONE (DELTASONE) 20 MG tablet Prednisone 40 mg daily for 4 days followed by  Prednisone 20 mg daily for 3 days ., Print      CONTINUE these medications which have NOT  CHANGED   Details  allopurinol (ZYLOPRIM) 100 MG tablet Take 1 tablet (100 mg total) by mouth 2 (two) times daily., Starting 12/17/2014, Until Discontinued, Normal    amLODipine (NORVASC) 10 MG tablet Take 1 tablet (10 mg total) by mouth daily., Starting 05/28/2015, Until Discontinued, Normal    Ascorbic Acid (VITAMIN C) 1000 MG tablet Take 1,000 mg by mouth daily., Until Discontinued, Historical Med    aspirin 81 MG tablet Take 81 mg by mouth daily., Until Discontinued, Historical Med    !! blood glucose meter kit and supplies KIT Use monitor to check blood sugar twice a day. DX E11.09, Normal    !! Blood Glucose  Monitoring Suppl (ONE TOUCH ULTRA 2) W/DEVICE KIT Test up to two times daily, Normal    Cholecalciferol (VITAMIN D3) 2000 UNITS TABS Take 2,000 Units by mouth daily., Until Discontinued, Historical Med    clopidogrel (PLAVIX) 75 MG tablet Take 1 tablet (75 mg total) by mouth daily., Starting 04/22/2015, Until Thu 04/21/16, Normal    cyanocobalamin 2000 MCG tablet Take 2,000 mcg by mouth daily., Until Discontinued, Historical Med    furosemide (LASIX) 40 MG tablet Take 1 tablet (40 mg total) by mouth every other day., Starting 05/04/2015, Until Discontinued, Print    glucose blood (ONE TOUCH TEST STRIPS) test strip Test up to two times daily, Normal    HYDROmorphone (DILAUDID) 2 MG tablet Take 0.5-1 tablets (1-2 mg total) by mouth every 4 (four) hours as needed for severe pain., Starting 04/06/2015, Until Discontinued, Print    Misc Natural Products (CURCUMAX PRO) TABS Take 1 tablet by mouth 2 (two) times daily., Until Discontinued, Historical Med    nabumetone (RELAFEN) 500 MG tablet Take 1 tablet (500 mg total) by mouth 2 (two) times daily as needed for moderate pain., Starting 04/09/2015, Until Discontinued, Normal    !! primidone (MYSOLINE) 50 MG tablet Take two tablets in the morning, 1 in the evening, Normal    !! primidone (MYSOLINE) 50 MG tablet Take 100 mg qam (2 pills) and 50 mg qpm (1 pill), Normal    ranitidine (ZANTAC) 150 MG tablet Take 1 tablet (150 mg total) by mouth 2 (two) times daily., Starting 02/16/2015, Until Discontinued, Normal    rosuvastatin (CRESTOR) 10 MG tablet Take 1 tablet (10 mg total) by mouth daily., Starting 04/24/2015, Until Discontinued, Normal    ONE TOUCH LANCETS MISC Test up to two times daily, Normal     !! - Potential duplicate medications found. Please discuss with provider.    STOP taking these medications     amiodarone (PACERONE) 200 MG tablet        Allergies  Allergen Reactions  . Benzodiazepines Other (See Comments)    Patient  states he gets stroke symptoms with benzo's.   . Coreg [Carvedilol] Other (See Comments)    Patient poor metabolizer of CYP2D6 - Coreg undergoes extensive hepatic (including 2D6)  . Diazepam Other (See Comments)    Stroke sx  . Lisinopril Other (See Comments)    Pt states C450, and hyperkalemia  . Mobic [Meloxicam] Other (See Comments)    Patient states he gets stroke symptoms with benzo's.   . Sertraline Hcl Other (See Comments)    Pt states all meds that are broken down with C-450- if so have sxs of stroke  . Tramadol Other (See Comments)    Unable to take C450  . Atorvastatin Other (See Comments)    MYALGIAS  . Acyclovir And Related Other (See Comments)  Follow-up Information    Follow up with Cathlean Cower, MD. Schedule an appointment as soon as possible for a visit in 1 week.   Specialties:  Internal Medicine, Radiology   Contact information:   [address] [place] [address] [phone]       Follow up with Mcarthur Rossetti, MD In 2 days.   Specialty:  Orthopedic Surgery   Why:  for gouty attack.   Contact information:   [place] [address] [address] [phone]        The results of significant diagnostics from this hospitalization (including imaging, microbiology, ancillary and laboratory) are listed below for reference.    Significant Diagnostic Studies: Dg Knee Complete 4 Views Left  06/10/2015  CLINICAL DATA:  LEFT knee pain since CABG 3 months ago, soft tissue swelling with warmth at entire joint for 3 days, cannot bear weight, painful to touch, history gout EXAM: LEFT KNEE - COMPLETE 4+ VIEW COMPARISON:  None FINDINGS: Osseous demineralization. Diffuse joint space narrowing with chondrocalcinosis question CPPD. No acute fracture, dislocation or bone destruction. No juxta-articular erosions. Probable knee joint effusion. Diffuse soft tissue swelling LEFT knee greatest anteriorly. Question calcific debris at the lateral recess.  Atherosclerotic calcifications at the distal superficial femoral and popliteal arteries extending into trifurcation vessels. IMPRESSION: Osseous demineralization with degenerative changes and question CPPD. Large joint effusion with regional soft tissue swelling. No definite acute bony abnormalities. Electronically Signed   By: Lavonia Dana M.D.   On: 06/10/2015 17:22    Microbiology: Recent Results (from the past 240 hour(s))  Culture, body fluid-bottle     Status: None (Preliminary result)   Collection Time: 06/10/15  9:53 PM  Result Value Ref Range Status   Specimen Description SYNOVIAL LEFT KNEE  Final   Special Requests BOTTLES DRAWN AEROBIC AND ANAEROBIC 10CC  Final   Culture NO GROWTH 4 DAYS  Final   Report Status PENDING  Incomplete  Gram stain     Status: None   Collection Time: 06/10/15  9:53 PM  Result Value Ref Range Status   Specimen Description SYNOVIAL LEFT KNEE  Final   Special Requests NONE  Final   Gram Stain   Final    MODERATE WBC PRESENT,BOTH PMN AND MONONUCLEAR NO ORGANISMS SEEN    Report Status 06/11/2015 FINAL  Final     Labs: Basic Metabolic Panel:  Recent Labs Lab 06/10/15 1640 06/11/15 1655 06/12/15 0612 06/13/15 0700 06/14/15 0555  NA 138 135 135 138 138  K 4.2 3.6 4.0 3.8 4.3  CL 102 96* 97* 99* 99*  CO2 25 27 27 28 30   GLUCOSE 138* 136* 203* 170* 151*  BUN 13 15 17  24* 26*  CREATININE 0.89 1.08 0.98 1.01 1.07  CALCIUM 9.6 9.7 9.3 9.3 8.9   Liver Function Tests: No results for input(s): AST, ALT, ALKPHOS, BILITOT, PROT, ALBUMIN in the last 168 hours. No results for input(s): LIPASE, AMYLASE in the last 168 hours. No results for input(s): AMMONIA in the last 168 hours. CBC:  Recent Labs Lab 06/10/15 1640 06/11/15 1655 06/13/15 0700  WBC 11.4* 11.8* 13.2*  NEUTROABS 8.6* 9.3*  --   HGB 14.9 15.4 14.6  HCT 45.0 46.6 43.9  MCV 86.4 85.3 84.9  PLT 166 171 219   Cardiac Enzymes: No results for input(s): CKTOTAL, CKMB, CKMBINDEX,  TROPONINI in the last 168 hours. BNP: BNP (last 3 results)  Recent Labs  06/11/15 1655  BNP 180.8*    ProBNP (  last 3 results) No results for input(s): PROBNP in the last 8760 hours.  CBG:  Recent Labs Lab 06/12/15 2206 06/13/15 1234 06/13/15 1712 06/14/15 0802 06/14/15 1157  GLUCAP 230* 200* 202* 134* 175*       Signed:  Mykal Batiz MD.  [facility] 06/15/2015, 10:19 AM

## 2015-06-15 NOTE — Patient Instructions (Signed)
Thank you for choosing [facility].  Summary/Instructions:  Your prescription(s) have been submitted to your pharmacy or been printed and provided for you. Please take as directed and contact our office if you believe you are having problem(s) with the medication(s) or have any questions.  Please stop by the lab on the basement level of the building for your blood work. Your results will be released to [place] (or called to you) after review, usually within 72 hours after test completion. If any changes need to be made, you will be notified at that same time.  If your symptoms worsen or fail to improve, please contact our office for further instruction, or in case of emergency go directly to the emergency room at the closest medical facility.   Gout Gout is an inflammatory arthritis caused by a buildup of uric acid crystals in the joints. Uric acid is a chemical that is normally present in the blood. When the level of uric acid in the blood is too high it can form crystals that deposit in your joints and tissues. This causes joint redness, soreness, and swelling (inflammation). Repeat attacks are common. Over time, uric acid crystals can form into masses (tophi) near a joint, destroying bone and causing disfigurement. Gout is treatable and often preventable. CAUSES  The disease begins with elevated levels of uric acid in the blood. Uric acid is produced by your body when it breaks down a naturally found substance called purines. Certain foods you eat, such as meats and fish, contain high amounts of purines. Causes of an elevated uric acid level include:  Being passed down from parent to child (heredity).  Diseases that cause increased uric acid production (such as obesity, psoriasis, and certain cancers).  Excessive alcohol use.  Diet, especially diets rich in meat and seafood.  Medicines, including certain cancer-fighting medicines (chemotherapy), water pills (diuretics), and  aspirin.  Chronic kidney disease. The kidneys are no longer able to remove uric acid well.  Problems with metabolism. Conditions strongly associated with gout include:  Obesity.  High blood pressure.  High cholesterol.  Diabetes. Not everyone with elevated uric acid levels gets gout. It is not understood why some people get gout and others do not. Surgery, joint injury, and eating too much of certain foods are some of the factors that can lead to gout attacks. SYMPTOMS   An attack of gout comes on quickly. It causes intense pain with redness, swelling, and warmth in a joint.  Fever can occur.  Often, only one joint is involved. Certain joints are more commonly involved:  Base of the big toe.  Knee.  Ankle.  Wrist.  Finger. Without treatment, an attack usually goes away in a few days to weeks. Between attacks, you usually will not have symptoms, which is different from many other forms of arthritis. DIAGNOSIS  Your caregiver will suspect gout based on your symptoms and exam. In some cases, tests may be recommended. The tests may include:  Blood tests.  Urine tests.  X-rays.  Joint fluid exam. This exam requires a needle to remove fluid from the joint (arthrocentesis). Using a microscope, gout is confirmed when uric acid crystals are seen in the joint fluid. TREATMENT  There are two phases to gout treatment: treating the sudden onset (acute) attack and preventing attacks (prophylaxis).  Treatment of an Acute Attack.  Medicines are used. These include anti-inflammatory medicines or steroid medicines.  An injection of steroid medicine into the affected joint is sometimes necessary.  The  painful joint is rested. Movement can worsen the arthritis.  You may use warm or cold treatments on painful joints, depending which works best for you.  Treatment to Prevent Attacks.  If you suffer from frequent gout attacks, your caregiver may advise preventive medicine. These  medicines are started after the acute attack subsides. These medicines either help your kidneys eliminate uric acid from your body or decrease your uric acid production. You may need to stay on these medicines for a very long time.  The early phase of treatment with preventive medicine can be associated with an increase in acute gout attacks. For this reason, during the first few months of treatment, your caregiver may also advise you to take medicines usually used for acute gout treatment. Be sure you understand your caregiver's directions. Your caregiver may make several adjustments to your medicine dose before these medicines are effective.  Discuss dietary treatment with your caregiver or dietitian. Alcohol and drinks high in sugar and fructose and foods such as meat, poultry, and seafood can increase uric acid levels. Your caregiver or dietitian can advise you on drinks and foods that should be limited. HOME CARE INSTRUCTIONS   Do not take aspirin to relieve pain. This raises uric acid levels.  Only take over-the-counter or prescription medicines for pain, discomfort, or fever as directed by your caregiver.  Rest the joint as much as possible. When in bed, keep sheets and blankets off painful areas.  Keep the affected joint raised (elevated).  Apply warm or cold treatments to painful joints. Use of warm or cold treatments depends on which works best for you.  Use crutches if the painful joint is in your leg.  Drink enough fluids to keep your urine clear or pale yellow. This helps your body get rid of uric acid. Limit alcohol, sugary drinks, and fructose drinks.  Follow your dietary instructions. Pay careful attention to the amount of protein you eat. Your daily diet should emphasize fruits, vegetables, whole grains, and fat-free or low-fat milk products. Discuss the use of coffee, vitamin C, and cherries with your caregiver or dietitian. These may be helpful in lowering uric acid  levels.  Maintain a healthy body weight. SEEK MEDICAL CARE IF:   You develop diarrhea, vomiting, or any side effects from medicines.  You do not feel better in 24 hours, or you are getting worse. SEEK IMMEDIATE MEDICAL CARE IF:   Your joint becomes suddenly more tender, and you have chills or a fever. MAKE SURE YOU:   Understand these instructions.  Will watch your condition.  Will get help right away if you are not doing well or get worse.   This information is not intended to replace advice given to you by your health care provider. Make sure you discuss any questions you have with your health care provider.   Document Released: 05/20/2000 Document Revised: 06/13/2014 Document Reviewed: 01/04/2012 Elsevier Interactive Patient Education [employer].

## 2015-06-15 NOTE — Telephone Encounter (Signed)
The appt pt had with you today was a TCM hospital follow up. Just in case you needed to add that code.

## 2015-06-15 NOTE — Progress Notes (Signed)
Pre visit review using our clinic review tool, if applicable. No additional management support is needed unless otherwise documented below in the visit note. 

## 2015-06-15 NOTE — Progress Notes (Signed)
Subjective:    Patient ID: Troy Banks, male    DOB: [birth year], 75 y.o.   MRN: [id number]  Chief Complaint  Patient presents with  . Gout    over a week, both knees are hurting really bad and left foot is flared up feels worse than he did yesterday    HPI:  Troy Banks is a 75 y.o. 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 and presents today for a follow up office visit.  Recently evaluated in the ED and admitted to the hospital for gouty arthritis.   Synovial fluid culture was obtained and noted to be normal. He was treated with pain medication and allopurinol. A referral to orthopedics was placed however no evidence of completion was noted. He was discharged on prednisone. All ED and hospital records and labs were reviewed in detail.  Since leaving the hospital he continues to have bilateral knee pain and pain located in the left foot. Previous history of flares in the left great toe. Pain is described as sharp and throbbing. Severity of the pain is around a 12/10. Modifying factors include prednisone which has not helped very much. Denies fevers.  Reports that after speaking with his pharmacist he is able to take colchicine with his P4 50 variation.   Allergies  Allergen Reactions  . Benzodiazepines Other (See Comments)    Patient states he gets stroke symptoms with benzo's.   . Coreg [Carvedilol] Other (See Comments)    Patient poor metabolizer of CYP2D6 - Coreg undergoes extensive hepatic (including 2D6)  . Diazepam Other (See Comments)    Stroke sx  . Lisinopril Other (See Comments)    Pt states C450, and hyperkalemia  . Mobic [Meloxicam] Other (See Comments)    Patient states he  gets stroke symptoms with benzo's.   . Sertraline Hcl Other (See Comments)    Pt states all meds that are broken down with C-450- if so have sxs of stroke  . Tramadol Other (See Comments)    Unable to take C450  . Atorvastatin Other (See Comments)    MYALGIAS  . Acyclovir And Related Other (See Comments)     Current Outpatient Prescriptions on File Prior to Visit  Medication Sig Dispense Refill  . allopurinol (ZYLOPRIM) 100 MG tablet Take 1 tablet (100 mg total) by mouth 2 (two) times daily. 180 tablet 3  . amLODipine (NORVASC) 10 MG tablet Take 1 tablet (10 mg total) by mouth daily. 90 tablet 1  . Ascorbic Acid (VITAMIN C) 1000 MG tablet Take 1,000 mg by mouth daily.    [name] aspirin 81 MG tablet Take 81 mg by mouth daily.    . blood glucose meter kit and supplies KIT Use monitor to check blood sugar twice a day. DX E11.09 1 each 0  . Blood Glucose Monitoring Suppl (ONE TOUCH ULTRA 2) W/DEVICE KIT Test up to two times daily 1 each 1  . Cholecalciferol (VITAMIN D3) 2000 UNITS TABS Take 2,000 Units by mouth daily.    . clopidogrel (PLAVIX) 75 MG tablet Take 1 tablet (75 mg total) by mouth daily. 30 tablet 11  . cyanocobalamin 2000 MCG tablet Take 2,000 mcg by mouth daily.    . furosemide (LASIX) 40 MG tablet Take 1  tablet (40 mg total) by mouth every other day. 30 tablet 0  . glucose blood (ONE TOUCH TEST STRIPS) test strip Test up to two times daily 100 each 12  . Misc Natural Products (CURCUMAX PRO) TABS Take 1 tablet by mouth 2 (two) times daily.    . nabumetone (RELAFEN) 500 MG tablet Take 1 tablet (500 mg total) by mouth 2 (two) times daily as needed for moderate pain. 180 tablet 3  . ONE TOUCH LANCETS MISC Test up to two times daily 100 each 11  . predniSONE (DELTASONE) 20 MG tablet Prednisone 40 mg daily for 4 days followed by  Prednisone 20 mg daily for 3 days . 11 tablet 0  . primidone (MYSOLINE) 50 MG tablet Take two tablets in the morning, 1 in the evening 270 tablet 1  .  primidone (MYSOLINE) 50 MG tablet Take 100 mg qam (2 pills) and 50 mg qpm (1 pill) 270 tablet 1  . ranitidine (ZANTAC) 150 MG tablet Take 1 tablet (150 mg total) by mouth 2 (two) times daily. 180 tablet 3  . rosuvastatin (CRESTOR) 10 MG tablet Take 1 tablet (10 mg total) by mouth daily. 30 tablet 3   No current facility-administered medications on file prior to visit.     Past Surgical History  Procedure Laterality Date  . Strangulated testicle  1960  . Left great toe Left x2- 1985 & 2004  . Anterior fusion cervical spine  2002  . Hernia repair  1962  . Kidney stones  1986  . Posterior fusion cervical spine  2006  . Cardiac catheterization N/A 03/26/2015    Procedure: Left Heart Cath and Coronary Angiography;  Surgeon: Leonie Man, MD;  Location: [place] [facility];  Service: Cardiovascular;  Laterality: N/A;  . Coronary artery bypass graft N/A 03/30/2015    Procedure: CORONARY ARTERY BYPASS GRAFTING (CABG) x four, using left internal mammary artery and left    leg greater saphenous vein harvested endoscopically ;  Surgeon: Grace Isaac, MD;  Location: [facility];  Service: Open Heart Surgery;  Laterality: N/A;  . Aortic valve replacement N/A 03/30/2015    Procedure: AORTIC VALVE REPLACEMENT (AVR);  Surgeon: Grace Isaac, MD;  Location: [facility];  Service: Open Heart Surgery;  Laterality: N/A;  . Tee without cardioversion N/A 03/30/2015    Procedure: TRANSESOPHAGEAL ECHOCARDIOGRAM (TEE);  Surgeon: Grace Isaac, MD;  Location: [facility];  Service: Open Heart Surgery;  Laterality: N/A;     Review of Systems  Constitutional: Negative for fever and chills.  Musculoskeletal: Positive for joint swelling and arthralgias.  Neurological: Negative for weakness and numbness.      Objective:    BP 170/90 mmHg  Pulse 68  Temp(Src) 97.9 F (36.6 C) (Oral)  Resp 18  Ht 5' 8"  (1.727 m)  Wt 211 lb 6.4 oz (95.89 kg)  BMI 32.15 kg/m2  SpO2 96% Nursing note and vital signs  reviewed.  Physical Exam  Constitutional: He is oriented to person, place, and time. He appears well-developed and well-nourished. No distress.  Cardiovascular: Normal rate, regular rhythm, normal heart sounds and intact distal pulses.   Pulmonary/Chest: Effort normal and breath sounds normal.  Musculoskeletal:   Left knee -   Moderate edema with no discoloration or deformity noted. Generalized tenderness with increased temperature. Meniscal and ligamentous testing deferred secondary to pain. Range of motion is limited secondary to pain. Distal pulses and sensation are intact and appropriate.   right knee -  No  obvious deformity, discoloration, or edema. Mild tenderness with no increased temperature compared to the contralateral side. Range of motion is within normal limits. Negative meniscal or ligamentous testing.  Neurological: He is alert and oriented to person, place, and time.  Skin: Skin is warm and dry.  Psychiatric: He has a normal mood and affect. His behavior is normal. Judgment and thought content normal.       Assessment & Plan:   Problem List Items Addressed This Visit      Other   Acute gout - Primary     Symptoms and exam consistent with acute gout exacerbation  Which is complicated by inability to take nonsteroidal anti-inflammatories. Maintained on allopurinol. Unlikely joint sepsis as previous synovial fluid culture was less then 50,000 on the white blood cell count. Start colchicine.  Refill Dilaudid per patient request. Continue current dosage of allopurinol. Continue prednisone taper as prescribed through the hospital. Follow-up if symptoms worsen or fail to improve.      Relevant Medications   colchicine 0.6 MG tablet   HYDROmorphone (DILAUDID) 2 MG tablet

## 2015-06-15 NOTE — Assessment & Plan Note (Addendum)
Symptoms and exam consistent with acute gout exacerbation  Which is complicated by inability to take nonsteroidal anti-inflammatories. Maintained on allopurinol. Unlikely joint sepsis as previous synovial fluid culture was less then 50,000 on the white blood cell count. Start colchicine.  Refill Dilaudid per patient request. Continue current dosage of allopurinol. Continue prednisone taper as prescribed through the hospital. Follow-up if symptoms worsen or fail to improve.

## 2015-06-15 NOTE — Telephone Encounter (Signed)
There was no documentation of a phone call prior to office visit or within 48 hours of discharge.

## 2015-06-18 ENCOUNTER — Encounter: Payer: Self-pay | Admitting: Cardiology

## 2015-06-18 ENCOUNTER — Ambulatory Visit (INDEPENDENT_AMBULATORY_CARE_PROVIDER_SITE_OTHER): Payer: Medicare Other | Admitting: Cardiology

## 2015-06-18 VITALS — BP 150/74 | HR 61 | Ht 68.0 in | Wt 214.2 lb

## 2015-06-18 DIAGNOSIS — I480 Paroxysmal atrial fibrillation: Secondary | ICD-10-CM | POA: Diagnosis not present

## 2015-06-18 DIAGNOSIS — Z951 Presence of aortocoronary bypass graft: Secondary | ICD-10-CM | POA: Diagnosis not present

## 2015-06-18 DIAGNOSIS — E785 Hyperlipidemia, unspecified: Secondary | ICD-10-CM

## 2015-06-18 DIAGNOSIS — Z954 Presence of other heart-valve replacement: Secondary | ICD-10-CM

## 2015-06-18 DIAGNOSIS — Z789 Other specified health status: Secondary | ICD-10-CM

## 2015-06-18 DIAGNOSIS — G250 Essential tremor: Secondary | ICD-10-CM

## 2015-06-18 DIAGNOSIS — Z952 Presence of prosthetic heart valve: Secondary | ICD-10-CM

## 2015-06-18 DIAGNOSIS — E8889 Other specified metabolic disorders: Secondary | ICD-10-CM

## 2015-06-18 DIAGNOSIS — M109 Gout, unspecified: Secondary | ICD-10-CM

## 2015-06-18 DIAGNOSIS — I10 Essential (primary) hypertension: Secondary | ICD-10-CM

## 2015-06-18 NOTE — Patient Instructions (Signed)
Continue same medications    Your physician wants you to follow-up in: 6 months with Dr.Jordan. You will receive a reminder letter in the mail two months in advance. If you don't receive a letter, please call our office to schedule the follow-up appointment.

## 2015-06-18 NOTE — Assessment & Plan Note (Signed)
Controlled.  

## 2015-06-18 NOTE — Progress Notes (Signed)
06/18/2015 Troy Banks   1940/10/04  [id number]  Primary Physician Cathlean Cower, MD Primary Cardiologist: Dr [name]  HPI:  75 y.o. Male admitted 10/19-10/31/16 with a non-STEMI. LHC demonstrated 3 vessel CAD. He underwent CABG with LIMA-LAD, SVG-intermediate, SVG-LCx, SVG-distal RCA and bioprosthetic AVR. Atenolol was initiated but later stopped secondary to bradycardia. He did develop atrial fibrillation. He was placed on amiodarone with return to NSR. He has maintained NSR and has recovered well. He has an essential tremor and is on Primidone chronically. 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 He was hospitalized for an acute gout attack in his [place]. He has maintained NSR. He returns for follow-up. He is here with his wife. She is retired Quarry manager. His gout is improving. He says his feet and hands are cold all the time since his CABG but he has no other complaints.    Current Outpatient Prescriptions  Medication Sig Dispense Refill  . allopurinol (ZYLOPRIM) 100 MG tablet Take 1 tablet (100 mg total) by mouth 2 (two) times daily. 180 tablet 3  . amLODipine (NORVASC) 10 MG tablet Take 1 tablet (10 mg total) by mouth daily. 90 tablet 1  . Ascorbic Acid (VITAMIN C) 1000 MG tablet Take 1,000 mg by mouth daily.    [name] aspirin 81 MG tablet Take 81 mg by mouth daily.    . blood glucose meter kit and supplies KIT Use monitor to check blood sugar twice a day. DX E11.09 1 each 0  . Blood Glucose Monitoring Suppl (ONE TOUCH ULTRA 2) W/DEVICE KIT Test up to two times daily 1 each 1  . Cholecalciferol (VITAMIN D3) 2000 UNITS TABS Take 2,000 Units by mouth daily.    . clopidogrel (PLAVIX) 75 MG tablet Take 1 tablet (75 mg total) by mouth daily. 30 tablet 11  . colchicine 0.6 MG tablet Take 2 tablets by mouth at onset and 1 tablet by mouth 1 hour later. 6 tablet  0  . cyanocobalamin 2000 MCG tablet Take 2,000 mcg by mouth daily.    . furosemide (LASIX) 40 MG tablet Take 1 tablet (40 mg total) by mouth every other day. 30 tablet 0  . glucose blood (ONE TOUCH TEST STRIPS) test strip Test up to two times daily 100 each 12  . HYDROmorphone (DILAUDID) 2 MG tablet Take 0.5-1 tablets (1-2 mg total) by mouth every 4 (four) hours as needed for severe pain. 6 tablet 0  . Misc Natural Products (CURCUMAX PRO) TABS Take 1 tablet by mouth 2 (two) times daily.    . nabumetone (RELAFEN) 500 MG tablet Take 1 tablet (500 mg total) by mouth 2 (two) times daily as needed for moderate pain. 180 tablet 3  . ONE TOUCH LANCETS MISC Test up to two times daily 100 each 11  . predniSONE (DELTASONE) 20 MG tablet Prednisone 40 mg daily for 4 days followed by  Prednisone 20 mg daily for 3 days . 11 tablet 0  . primidone (MYSOLINE) 50 MG tablet Take 100 mg qam (2 pills) and 50 mg qpm (1 pill) 270 tablet 1  . ranitidine (ZANTAC) 150 MG tablet Take 1 tablet (150 mg total) by mouth 2 (two) times daily. 180 tablet 3  . rosuvastatin (CRESTOR) 10 MG tablet Take 1 tablet (10 mg total) by mouth daily. 30 tablet 3   No current facility-administered medications  for this visit.    Allergies  Allergen Reactions  . Benzodiazepines Other (See Comments)    Patient states he gets stroke symptoms with benzo's.   . Coreg [Carvedilol] Other (See Comments)    Patient poor metabolizer of CYP2D6 - Coreg undergoes extensive hepatic (including 2D6)  . Diazepam Other (See Comments)    Stroke sx  . Lisinopril Other (See Comments)    Pt states C450, and hyperkalemia  . Mobic [Meloxicam] Other (See Comments)    Patient states he gets stroke symptoms with benzo's.   . Sertraline Hcl Other (See Comments)    Pt states all meds that are broken down with C-450- if so have sxs of stroke  . Tramadol Other (See Comments)    Unable to take C450  . Atorvastatin Other (See Comments)    MYALGIAS  . Acyclovir  And Related Other (See Comments)    Social History   Social History  . Marital Status: Significant Other    Spouse Name: N/A  . Number of Children: N/A  . Years of Education: N/A   Occupational History  . Retired Animal nutritionist    Social History Main Topics  . Smoking status: Former Smoker    Quit date: 06/06/1990  . Smokeless tobacco: Not on file     Comment: widowed 1991 - lives with SO [name] who is Therapist, sports  . Alcohol Use: Yes     Comment: glass of wine once a week  . Drug Use: No  . Sexual Activity: Not on file   Other Topics Concern  . Not on file   Social History Narrative     Review of Systems: General: negative for chills, fever, night sweats or weight changes.  Cardiovascular: negative for chest pain, dyspnea on exertion, edema, orthopnea, palpitations, paroxysmal nocturnal dyspnea or shortness of breath Dermatological: negative for rash Respiratory: negative for cough or wheezing Urologic: negative for hematuria Abdominal: negative for nausea, vomiting, diarrhea, bright red blood per rectum, melena, or hematemesis Neurologic: negative for visual changes, syncope, or dizziness All other systems reviewed and are otherwise negative except as noted above.    Blood pressure 150/74, pulse 61, height 5' 8"  (1.727 m), weight 214 lb 4 oz (97.183 kg).  General appearance: alert, cooperative, no distress and mild resting tremor Neck: no carotid bruit, no JVD, supple, symmetrical, trachea midline and thyroid not enlarged, symmetric, no tenderness/mass/nodules Lungs: clear to auscultation bilaterally Heart: regular rate and rhythm Extremities: no edema, 2+ LE and UE distal pulses Skin: Skin color, texture, turgor normal. No rashes or lesions Neurologic: Grossly normal  EKG NSR, NSST changes, QTc 465  ASSESSMENT AND PLAN:   S/P CABG x 4-Oct 2016 No angina, recovering well  H/O tissue AVR Oct 2016 .  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  Essential tremor On Primidone  Acute gouty arthritis Recently hospitalized for gout [place]- steroid dose pack  Essential hypertension Controlled  Dyslipidemia On statin Rx   PLAN  I reassured the pt his circulation to his hands and feet was good. Same Rx, f/u Dr [name] 6 months  Kerin Ransom K [name] 06/18/2015 9:03 AM

## 2015-06-18 NOTE — Assessment & Plan Note (Signed)
No angina, recovering well

## 2015-06-18 NOTE — Assessment & Plan Note (Signed)
Recently hospitalized for gout [place]- steroid dose pack

## 2015-06-18 NOTE — Assessment & Plan Note (Signed)
On Primidone 

## 2015-06-18 NOTE — Assessment & Plan Note (Signed)
Holding NSR, Amiodarone DC'd

## 2015-06-18 NOTE — Assessment & Plan Note (Signed)
On statin Rx 

## 2015-06-22 NOTE — Telephone Encounter (Signed)
He was dc'ed on Sunday the 8th. His appt was on the 9 th before the TCM report was printed. Last message was an [place].

## 2015-06-29 DIAGNOSIS — M25562 Pain in left knee: Secondary | ICD-10-CM | POA: Diagnosis not present

## 2015-07-01 ENCOUNTER — Other Ambulatory Visit: Payer: Self-pay | Admitting: Cardiothoracic Surgery

## 2015-07-01 DIAGNOSIS — Z951 Presence of aortocoronary bypass graft: Secondary | ICD-10-CM

## 2015-07-02 ENCOUNTER — Ambulatory Visit
Admission: RE | Admit: 2015-07-02 | Discharge: 2015-07-02 | Disposition: A | Discharge status: Home / Self Care | Payer: Medicare Other | Source: Ambulatory Visit | Attending: Cardiothoracic Surgery | Admitting: Cardiothoracic Surgery

## 2015-07-02 ENCOUNTER — Ambulatory Visit (INDEPENDENT_AMBULATORY_CARE_PROVIDER_SITE_OTHER): Payer: Medicare Other | Admitting: Cardiothoracic Surgery

## 2015-07-02 ENCOUNTER — Encounter: Payer: Self-pay | Admitting: Cardiothoracic Surgery

## 2015-07-02 VITALS — BP 136/78 | HR 70 | Resp 20 | Ht 68.0 in | Wt 214.0 lb

## 2015-07-02 DIAGNOSIS — I214 Non-ST elevation (NSTEMI) myocardial infarction: Secondary | ICD-10-CM

## 2015-07-02 DIAGNOSIS — Z952 Presence of prosthetic heart valve: Secondary | ICD-10-CM

## 2015-07-02 DIAGNOSIS — R918 Other nonspecific abnormal finding of lung field: Secondary | ICD-10-CM | POA: Diagnosis not present

## 2015-07-02 DIAGNOSIS — Z954 Presence of other heart-valve replacement: Secondary | ICD-10-CM

## 2015-07-02 DIAGNOSIS — Z951 Presence of aortocoronary bypass graft: Secondary | ICD-10-CM | POA: Diagnosis not present

## 2015-07-02 DIAGNOSIS — I2510 Atherosclerotic heart disease of native coronary artery without angina pectoris: Secondary | ICD-10-CM

## 2015-07-02 DIAGNOSIS — I350 Nonrheumatic aortic (valve) stenosis: Secondary | ICD-10-CM | POA: Diagnosis not present

## 2015-07-02 NOTE — Progress Notes (Signed)
[address]       [place] [address]             [phone]                  Clester D Bains  Medical Record #[id number] Date of Birth: November 25, 1940  Referring QP:[id number], Leonie Green, MD Primary Cardiology: Primary Care:[name], MD  Chief Complaint:  Follow Up Visit DATE OF PROCEDURE: 03/30/2015  OPERATIVE REPORT PREOPERATIVE DIAGNOSIS: Three-vessel coronary artery disease with recent non-STEMI myocardial infarction and moderate aortic stenosis. POSTOPERATIVE DIAGNOSIS: Three-vessel coronary artery disease with recent non-STEMI myocardial infarction and moderate aortic stenosis. SURGICAL PROCEDURE: Coronary artery bypass grafting x4, with the left internal mammary to the left anterior descending coronary artery, reverse saphenous vein graft to the intermediate coronary artery, reverse saphenous vein graft to the circumflex coronary artery, reverse saphenous vein graft to the distal right coronary artery, and aortic valve replacement with a pericardial tissue valve [employer], model #[id number], 23 mm, serial #[id number] and Endovein greater at the left, greater saphenous thigh and calf Endovein harvesting. SURGEON: Lanelle Bal, M.D.    History of Present Illness:    The patient is a 75 year old male seen in routine office follow-up status post the above procedure. He overall reports that he feels quite well and is in fact more energetic than he has been in quite some time. He has only some mild discomforts. He denies shortness of breath. He denies anginal chest pain. He denies fevers, chills or other constitutional symptoms. He is walking approximately 1 mile daily.  Since his valve replacement he developed severe gout in the left knee requiring several days of hospitalization and tapping his knee. He is now significantly improved. And he is ready to start in cardiac rehabilitation    [name] Score: At the time of surgery  this patient's most appropriate activity status/level should be described as: []     0    Normal activity, no symptoms []     1    Restricted in physical strenuous activity but ambulatory, able to do out light work []     2    Ambulatory and capable of self care, unable to do work activities, up and about                 >50 % of waking hours                                                                                   []     3    Only limited self care, in bed greater than 50% of waking hours []     4    Completely disabled, no self care, confined to bed or chair []     5    Moribund  History  Smoking status  . Former Smoker  . Quit date: 06/06/1990  Smokeless tobacco  . Not on file    Comment: widowed 1991 - lives with SO [name] who is RN       Allergies  Allergen Reactions  . Benzodiazepines Other (See Comments)    Patient states he gets stroke symptoms with  benzo's.   . Coreg [Carvedilol] Other (See Comments)    Patient poor metabolizer of CYP2D6 - Coreg undergoes extensive hepatic (including 2D6)  . Diazepam Other (See Comments)    Stroke sx  . Lisinopril Other (See Comments)    Pt states C450, and hyperkalemia  . Mobic [Meloxicam] Other (See Comments)    Patient states he gets stroke symptoms with benzo's.   . Sertraline Hcl Other (See Comments)    Pt states all meds that are broken down with C-450- if so have sxs of stroke  . Tramadol Other (See Comments)    Unable to take C450  . Atorvastatin Other (See Comments)    MYALGIAS  . Acyclovir And Related Other (See Comments)    Current Outpatient Prescriptions  Medication Sig Dispense Refill  . allopurinol (ZYLOPRIM) 100 MG tablet Take 1 tablet (100 mg total) by mouth 2 (two) times daily. 180 tablet 3  . amLODipine (NORVASC) 10 MG tablet Take 1 tablet (10 mg total) by mouth daily. 90 tablet 1  . Ascorbic Acid (VITAMIN C) 1000 MG tablet Take 1,000 mg by mouth daily.    [name] aspirin 81 MG tablet Take 81 mg by mouth daily.      . blood glucose meter kit and supplies KIT Use monitor to check blood sugar twice a day. DX E11.09 1 each 0  . Blood Glucose Monitoring Suppl (ONE TOUCH ULTRA 2) W/DEVICE KIT Test up to two times daily 1 each 1  . Cholecalciferol (VITAMIN D3) 2000 UNITS TABS Take 2,000 Units by mouth daily.    . clopidogrel (PLAVIX) 75 MG tablet Take 1 tablet (75 mg total) by mouth daily. 30 tablet 11  . cyanocobalamin 2000 MCG tablet Take 2,000 mcg by mouth daily.    . furosemide (LASIX) 40 MG tablet Take 1 tablet (40 mg total) by mouth every other day. 30 tablet 0  . glucose blood (ONE TOUCH TEST STRIPS) test strip Test up to two times daily 100 each 12  . Misc Natural Products (CURCUMAX PRO) TABS Take 1 tablet by mouth 2 (two) times daily.    . nabumetone (RELAFEN) 500 MG tablet Take 1 tablet (500 mg total) by mouth 2 (two) times daily as needed for moderate pain. 180 tablet 3  . ONE TOUCH LANCETS MISC Test up to two times daily 100 each 11  . primidone (MYSOLINE) 50 MG tablet Take 100 mg qam (2 pills) and 50 mg qpm (1 pill) 270 tablet 1  . ranitidine (ZANTAC) 150 MG tablet Take 1 tablet (150 mg total) by mouth 2 (two) times daily. 180 tablet 3  . rosuvastatin (CRESTOR) 10 MG tablet Take 1 tablet (10 mg total) by mouth daily. 30 tablet 3   No current facility-administered medications for this visit.       Physical Exam: BP 136/78 mmHg  Pulse 70  Resp 20  Ht 5' 8"  (1.727 m)  Wt 214 lb (97.07 kg)  BMI 32.55 kg/m2  SpO2 98%  General appearance: alert, cooperative and no distress Heart: regular rate and rhythm Lungs: clear to auscultation bilaterally Abdomen: soft, non-tender; bowel sounds normal; no masses,  no organomegaly Extremities: +1 plus lower extremity edema Wound: Incisions are noted to be healing well without evidence of infection. Sternum is stable.   Diagnostic Studies & Laboratory data:         Recent Radiology Findings: Dg Chest 2 View  07/02/2015  CLINICAL DATA:  CABG. EXAM:  CHEST  2 VIEW COMPARISON:  None.  FINDINGS: Mediastinum and hilar structures normal. Prior CABG. Cardiac valve replacement . Lungs are clear. No pleural effusion or pneumothorax. No acute bony abnormality. Old right rib fractures. IMPRESSION: 1. Prior CABG and cardiac valve replacement. 2. No focal pulmonary infiltrate. 3. Old right rib fractures. Electronically Signed   By: Marcello Moores  Register   On: 07/02/2015 11:51      I have independently reviewed the above radiology findings and reviewed findings  with the patient.  Recent Labs: Lab Results  Component Value Date   WBC 13.2* 06/13/2015   HGB 14.6 06/13/2015   HCT 43.9 06/13/2015   PLT 219 06/13/2015   GLUCOSE 151* 06/14/2015   CHOL 184 03/26/2015   TRIG 144 03/26/2015   HDL 36* 03/26/2015   LDLDIRECT 100.0 09/08/2014   LDLCALC 119* 03/26/2015   ALT 30 03/29/2015   AST 23 03/29/2015   NA 138 06/14/2015   K 4.3 06/14/2015   CL 99* 06/14/2015   CREATININE 1.07 06/14/2015   BUN 26* 06/14/2015   CO2 30 06/14/2015   TSH 7.951* 03/26/2015   INR 1.47 03/30/2015   HGBA1C 5.9* 06/13/2015      Assessment / Plan:  Overall the patient is doing quite well status post CABG/AVR as described above. I have given him further instructions regarding activity progression. [name] He is given lifting and weight limit instructions. He is given driving instructions.   I reviewed with him endocarditis precautions. He will start in cardiac rehabilitation in the near future. Plan to see him back as needed       Grace Isaac 07/02/2015 12:31 PM

## 2015-07-02 NOTE — Patient Instructions (Signed)
Endocarditis Information  You may be at risk for developing endocarditis since you have  an artificial heart valve  or a repaired heart valve. Endocarditis is an infection of the lining of the heart or heart valves.   Certain surgical and dental procedures may put you at risk,  such as teeth cleaning or other dental procedures or any surgery involving the respiratory, urinary, gastrointestinal tract, gallbladder or prostate.   Notify your doctor or dentist before having any invasive procedures. You will need to take antibiotics before certain procedures.   To prevent endocarditis, maintain good oral health. Seek prompt medical attention for any mouth/gum, skin or urinary tract infections.   

## 2015-07-21 ENCOUNTER — Encounter: Payer: Self-pay | Admitting: Neurology

## 2015-07-21 ENCOUNTER — Ambulatory Visit (INDEPENDENT_AMBULATORY_CARE_PROVIDER_SITE_OTHER): Payer: Medicare Other | Admitting: Neurology

## 2015-07-21 VITALS — BP 160/80 | HR 63 | Ht 68.0 in | Wt 217.0 lb

## 2015-07-21 DIAGNOSIS — R253 Fasciculation: Secondary | ICD-10-CM | POA: Diagnosis not present

## 2015-07-21 DIAGNOSIS — R2681 Unsteadiness on feet: Secondary | ICD-10-CM | POA: Diagnosis not present

## 2015-07-21 DIAGNOSIS — G250 Essential tremor: Secondary | ICD-10-CM | POA: Diagnosis not present

## 2015-07-21 DIAGNOSIS — I2510 Atherosclerotic heart disease of native coronary artery without angina pectoris: Secondary | ICD-10-CM

## 2015-07-21 NOTE — Progress Notes (Signed)
Subjective:   Troy Banks was seen in consultation in the movement disorder clinic at the request of Cathlean Cower, MD.  The evaluation is for tremor.  The records that were made available to me were reviewed.  Pt is accompanied by his significant other who supplements the hx.    The patient is a 75 y.o. left handed male with a history of tremor.  Pt reports tremor for at least 30 years.  Pt has noted increasing tremor over the years. Tremor is mostly in the right hand, but it is in the left as well and more rarely in the head as well.  Stress brings out the tremor.   He has never been to a neurologist for tremor.   Is currently on small amount of metoprolol - 25 mg - but that cannot be increased due to hx of bradycardia.  There is a family hx of tremor in his mother, maternal GM, maternal sister and maternal cousins.    Affected by caffeine:  No. (only drinks 2 cups coffee per week; drinks green tea) Affected by alcohol:  Used to decrease tremor but doesn't seem to any longer Affected by stress:  Yes.   Affected by fatigue:  Yes.   Spills soup if on spoon:  No. but has trouble with corn/peas Spills glass of liquid if full:  Yes.   Affects ADL's (tying shoes, brushing teeth, etc):  Yes.   (slow to tie shoes)  06/30/14 update:  Last visit, we started the patient on primidone for his tremor.  He cannot take Topamax because of history of nephrolithiasis or beta blockers because of bradycardia.  The patient reports that he is overall doing well.  For the first time in years, he was able to stand up and put on his jeans as he thinks that the medication helped his balance as well as tremor.  He has good and bad days in terms of tremor.  He has no SE in terms of tremor.  He states though that his metoprolol has been cut back since last visit because of bradycardia.    10/08/14 update:  Pt is on primidone 71m bid.  Pt states that initially it seemed helpful but nowl having tremor; had an episode last  week where couldn't shave because of tremor (not unusual to see tremor while shaving but almost unable to shave) and then whole body started shaking.  Lasted all day.  He had more word finding issues that day (has baseline word finding issues).  States that "I am concerned about something beyond familial tremor."  He is worried about PD.  Having cramping in the fingers and sometimes in the toes. Hands/fingers more than the feet.  He notices that his right arm "jerks", but thinks that that is due to "a botched neck surgery."  No significant issues with swallowing.  No diplopia.  10/30/14 update:  The patient is seen today, accompanied by his wife who supplements the history.  He had an MRI of the brain on 10/24/2014 that was unremarkable with exception of mild small vessel disease.  He had an MRI of the cervical spine demonstrating stenosis at several levels, but especially at the C6-C7 level and C3-C4 levels.  There was questionable thyroid enlargement and he was asked to follow up with his primary care physician in that regard.  On 10/28/2014 he underwent an EMG at our office.  This suggested chronic on active changes and intraspinal lesion at the right C6 level and  right S1 levels.  The etiology of this is unknown, but it could be anywhere from compressive etiologies to degeneration of the anterior horn cells.  He does not meet criteria for motor neuron disease at this point in time.  In regards to tremor, he has a few what he calls "episodes" in which his arms or legs will tremor more and may last 45 seconds.  BS was normal.  No known stress sets them off.  He may have a stuttering speech when it happens.    03/17/15 update:  The patient presents for follow-up today.  He is on primidone, 50 mg bid for essential tremor. Tremor has picked up but mostly with stress.  He thinks that we need to increase the primidone.   He is not a candidate for Topamax because of nephrolithiasis and is not a candidate for beta  blockers because of bradycardia.  We have worked him up extensively because of fasciculations.   No falls but "my balance is way off."   He had an MRI of the lumbar spine after we last met.  This was done on June 7.  There was evidence of severe facet arthrosis at the L4-L5 level and L5-S1 levels.  I told him that these findings do not explain all of his findings, including fasciculations.  He had an EEG on June 2 that was unremarkable.  This was done because of episodes of transient alteration of awareness that his wife had described.  His wife states that they didn't do the prolonged EEG because his insurance wouldn't allow it without a significant copay.  They are planning on r/s soon.  07/21/15 update:  The patient is seen today in follow-up, accompanied by his wife who supplements the history.  Much has happened since our last visit and I did review records.  Last visit, I increased his primidone to 100 mg in the morning and 50 mg at night.  He ended up being hospitalized in October, 2016 with an NSTEMI and ultimately required a coronary artery bypass graft and aortic valve replacement on 03/30/2015.  While in the hospital for this, he developed atrial fibrillation and was started on amiodarone.  Not surprisingly, this caused a significant increase in tremor.  He was rehospitalized in January, 2017 for acute gout and while he was in there he mentioned that tremor had increased.  His amiodarone was stopped.  He did have a repeat EMG since our last visit and this looked much better, without evidence of motor neuron disease.  He does feel that his balance has been a bit off.  Did not do cardiac rehab after CABG.    Current/Previously tried tremor medications: neurontin - was up to 300 mg bid for rotator cuff issues but never seemed to help tremor; back on it at low dose for hip pain.    Outside reports reviewed: historical medical records and referral letter/letters.  Allergies  Allergen Reactions  .  Benzodiazepines Other (See Comments)    Patient states he gets stroke symptoms with benzo's.   . Coreg [Carvedilol] Other (See Comments)    Patient poor metabolizer of CYP2D6 - Coreg undergoes extensive hepatic (including 2D6)  . Diazepam Other (See Comments)    Stroke sx  . Lisinopril Other (See Comments)    Pt states C450, and hyperkalemia  . Mobic [Meloxicam] Other (See Comments)    Patient states he gets stroke symptoms with benzo's.   . Sertraline Hcl Other (See Comments)    Pt states  all meds that are broken down with C-450- if so have sxs of stroke  . Tramadol Other (See Comments)    Unable to take C450  . Atorvastatin Other (See Comments)    MYALGIAS  . Acyclovir And Related Other (See Comments)    Outpatient Encounter Prescriptions as of 07/21/2015  Medication Sig  . allopurinol (ZYLOPRIM) 100 MG tablet Take 1 tablet (100 mg total) by mouth 2 (two) times daily.  [name] amLODipine (NORVASC) 10 MG tablet Take 1 tablet (10 mg total) by mouth daily.  . Ascorbic Acid (VITAMIN C) 1000 MG tablet Take 1,000 mg by mouth daily.  [name] aspirin 81 MG tablet Take 81 mg by mouth daily.  . blood glucose meter kit and supplies KIT Use monitor to check blood sugar twice a day. DX E11.09  . Blood Glucose Monitoring Suppl (ONE TOUCH ULTRA 2) W/DEVICE KIT Test up to two times daily  . Cholecalciferol (VITAMIN D3) 2000 UNITS TABS Take 2,000 Units by mouth daily.  . clopidogrel (PLAVIX) 75 MG tablet Take 1 tablet (75 mg total) by mouth daily.  . cyanocobalamin 2000 MCG tablet Take 2,000 mcg by mouth daily.  . furosemide (LASIX) 40 MG tablet Take 1 tablet (40 mg total) by mouth every other day.  [name] glucose blood (ONE TOUCH TEST STRIPS) test strip Test up to two times daily  . Misc Natural Products (CURCUMAX PRO) TABS Take 1 tablet by mouth 2 (two) times daily.  . nabumetone (RELAFEN) 500 MG tablet Take 1 tablet (500 mg total) by mouth 2 (two) times daily as needed for moderate pain.  . ONE TOUCH LANCETS  MISC Test up to two times daily  . primidone (MYSOLINE) 50 MG tablet Take 100 mg qam (2 pills) and 50 mg qpm (1 pill)  . ranitidine (ZANTAC) 150 MG tablet Take 1 tablet (150 mg total) by mouth 2 (two) times daily.  . rosuvastatin (CRESTOR) 10 MG tablet Take 1 tablet (10 mg total) by mouth daily.   No facility-administered encounter medications on file as of 07/21/2015.    Past Medical History  Diagnosis Date  . COLONIC POLYPS, HX OF   . DIABETES MELLITUS     diet controlled  . DIVERTICULOSIS, COLON   . GOUT   . NEPHROLITHIASIS, HX OF     remote open stone-retrieval on L  . HYPERTENSION     off ACEI 2010 because of hyperkalemia in setting of ARI  . OSA on CPAP   . Cervical stenosis of spinal canal     fusion 2006  . Familial tremor 03/19/2014  . 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 Aortic stenosis, moderate 03/27/2015    post AVR echo Echo 11/16: Mild LVH, EF 55-60%, normal wall motion, grade 1 diastolic dysfunction, bioprosthetic AVR okay (mean gradient 18 mmHg) with trivial AI, mild LAE, atrial septal aneurysm, small effusion     Past Surgical History  Procedure Laterality Date  . Strangulated testicle  1960  . Left great toe Left x2- 1985 & 2004  . Anterior fusion cervical spine  2002  . Hernia repair  1962  . Kidney stones  1986  . Posterior fusion cervical spine  2006  . Cardiac catheterization N/A 03/26/2015    Procedure: Left Heart Cath and Coronary Angiography;  Surgeon: Leonie Man, MD;  Location: [facility];  Service: Cardiovascular;  Laterality: N/A;  . Coronary artery bypass graft N/A 03/30/2015  Procedure: CORONARY ARTERY BYPASS GRAFTING (CABG) x four, using left internal mammary artery and left    leg greater saphenous vein harvested endoscopically ;  Surgeon: Grace Isaac, MD;  Location: [facility];  Service: Open Heart Surgery;  Laterality: N/A;  . Aortic valve replacement N/A  03/30/2015    Procedure: AORTIC VALVE REPLACEMENT (AVR);  Surgeon: Grace Isaac, MD;  Location: [facility];  Service: Open Heart Surgery;  Laterality: N/A;  . Tee without cardioversion N/A 03/30/2015    Procedure: TRANSESOPHAGEAL ECHOCARDIOGRAM (TEE);  Surgeon: Grace Isaac, MD;  Location: [facility];  Service: Open Heart Surgery;  Laterality: N/A;    Social History   Social History  . Marital Status: Significant Other    Spouse Name: N/A  . Number of Children: N/A  . Years of Education: N/A   Occupational History  . Retired Animal nutritionist    Social History Main Topics  . Smoking status: Former Smoker    Quit date: 06/06/1990  . Smokeless tobacco: Not on file     Comment: widowed 1991 - lives with SO [name] who is Therapist, sports  . Alcohol Use: Yes     Comment: glass of wine once a week  . Drug Use: No  . Sexual Activity: Not on file   Other Topics Concern  . Not on file   Social History Narrative    Family Status  Relation Status Death Age  . Mother Deceased     Heart Disease, tremor  . Father Deceased     Heart Disease  . Sister Deceased     tremor  . Brother      unknown  . Brother Alive     healthy  . Son Alive     healthy    Review of Systems A complete 10 system ROS was obtained and was negative apart from what is mentioned.   Objective:   VITALS:   Filed Vitals:   07/21/15 0812  BP: 160/80  Pulse: 63  Height: '5\' 8"'$  (1.727 m)  Weight: 217 lb (98.431 kg)   Wt Readings from Last 3 Encounters:  07/21/15 217 lb (98.431 kg)  07/02/15 214 lb (97.07 kg)  06/18/15 214 lb 4 oz (97.183 kg)    Gen:  Appears stated age and in NAD. HEENT:  Normocephalic, atraumatic. The mucous membranes are moist. The superficial temporal arteries are without ropiness or tenderness.  No tongue fasciculations. Cardiovascular: RRR Lungs: Clear to auscultation bilaterally. Neck: There are no carotid bruits noted bilaterally.  NEUROLOGICAL:  Orientation:  The patient is  alert and oriented x 3.  Recent and remote memory are intact.  Attention span and concentration are normal.  Able to name objects and repeat without trouble.  Fund of knowledge is appropriate Cranial nerves: There is good facial symmetry.  Extraocular muscles are intact and visual fields are full to confrontational testing. Speech is fluent and clear. Soft palate rises symmetrically and there is no tongue deviation. Hearing is intact to conversational tone. Tone: Tone is good throughout. Sensation: Sensation is intact to light touch throughout. Coordination:  The patient has no dysdiadichokinesia or dysmetria. Motor: Strength is 5/5 in the bilateral upper and lower extremities.  Shoulder shrug is equal bilaterally.  There is no pronator drift.   Gait and Station: The patient is able to ambulate without difficulty.   MOVEMENT EXAM: Tremor:  There is tremor in the UE, noted most significantly with action and more on the L side than the right.  There is no significant head tremor today, which is improved.  Mild-mod trouble with archimedes spirals (biggest prob getting pen onto page)  Labs:    Chemistry      Component Value Date/Time   NA 138 06/14/2015 0555   K 4.3 06/14/2015 0555   CL 99* 06/14/2015 0555   CO2 30 06/14/2015 0555   BUN 26* 06/14/2015 0555   CREATININE 1.07 06/14/2015 0555      Component Value Date/Time   CALCIUM 8.9 06/14/2015 0555   ALKPHOS 42 03/29/2015 0224   AST 23 03/29/2015 0224   ALT 30 03/29/2015 0224   BILITOT 1.0 03/29/2015 0224     Lab Results  Component Value Date   HGBA1C 5.9* 06/13/2015   Lab Results  Component Value Date   WBC 13.2* 06/13/2015   HGB 14.6 06/13/2015   HCT 43.9 06/13/2015   MCV 84.9 06/13/2015   PLT 219 06/13/2015   Lab Results  Component Value Date   TSH 7.951* 03/26/2015        Assessment/Plan:   1.  Essential Tremor.  -This is evidenced by the symmetrical nature and longstanding hx of gradually getting worse and  family hx.  -continue primidone to 100 mg in AM, continue 50 mg at night.    Called pharmacy previously due to his hx of cytochrome P450 problems but told that no interaction and not an inducer of this system.  Don't want to increase again yet.  Just d/c amiodarone recently and this med has long half life.  He had great increase in tremor with this.  Risks, benefits, side effects and alternative therapies were discussed.  The opportunity to ask questions was given and they were answered to the best of my ability.  The patient expressed understanding and willingness to follow the outlined treatment protocols.  -Not a candidate for topamax due to current nephrolithiasis and hx of same.  Not able to increase beta blocker due to h/o bradycardia.  May be able to push up neurontin but would need to push up to much higher dose.  2.  Fasciculations  -repeat EMG in 12/206 much improved without evidence of MND  -The patient does have a compressive lesion in the cervical spine, but I'm not sure that that explains all of his symptoms.   3.  Episodes of tremulousness, accompanied by potential speech change and ataxia according to his wife  -Pt can r/s 48 hour EEG at his convenience.  Routine EEG was negative and no further spells. 4.  Increased balance loss after CABG  -didn't do cardiac rehab.  Talked to him about doing it and balance therapy may help as well.  He is not opposed and is going to think about it. 5.  Follow up is anticipated in the next few months, sooner should new neurologic issues arise.  Much greater than 50% of this visit was spent in counseling with the patient and the family.  Total face to face time:  35 min

## 2015-07-23 ENCOUNTER — Other Ambulatory Visit: Payer: Self-pay | Admitting: Cardiology

## 2015-07-23 NOTE — Telephone Encounter (Signed)
Rx request sent to pharmacy.  

## 2015-07-27 ENCOUNTER — Other Ambulatory Visit: Payer: Self-pay | Admitting: Cardiology

## 2015-07-27 NOTE — Telephone Encounter (Signed)
REFILL 

## 2015-08-27 DIAGNOSIS — M1009 Idiopathic gout, multiple sites: Secondary | ICD-10-CM | POA: Diagnosis not present

## 2015-08-27 DIAGNOSIS — M25561 Pain in right knee: Secondary | ICD-10-CM | POA: Diagnosis not present

## 2015-09-21 ENCOUNTER — Encounter: Payer: Medicare Other | Admitting: Internal Medicine

## 2015-09-22 ENCOUNTER — Other Ambulatory Visit (INDEPENDENT_AMBULATORY_CARE_PROVIDER_SITE_OTHER): Payer: Medicare Other

## 2015-09-22 ENCOUNTER — Ambulatory Visit (INDEPENDENT_AMBULATORY_CARE_PROVIDER_SITE_OTHER)
Admission: RE | Admit: 2015-09-22 | Discharge: 2015-09-22 | Disposition: A | Discharge status: Home / Self Care | Payer: Medicare Other | Source: Ambulatory Visit | Attending: Internal Medicine | Admitting: Internal Medicine

## 2015-09-22 ENCOUNTER — Ambulatory Visit (INDEPENDENT_AMBULATORY_CARE_PROVIDER_SITE_OTHER): Payer: Medicare Other | Admitting: Internal Medicine

## 2015-09-22 VITALS — BP 140/80 | HR 68 | Temp 98.4°F | Resp 20 | Wt 217.0 lb

## 2015-09-22 DIAGNOSIS — F329 Major depressive disorder, single episode, unspecified: Secondary | ICD-10-CM | POA: Diagnosis not present

## 2015-09-22 DIAGNOSIS — R079 Chest pain, unspecified: Secondary | ICD-10-CM

## 2015-09-22 DIAGNOSIS — G8929 Other chronic pain: Secondary | ICD-10-CM

## 2015-09-22 DIAGNOSIS — N320 Bladder-neck obstruction: Secondary | ICD-10-CM | POA: Diagnosis not present

## 2015-09-22 DIAGNOSIS — I10 Essential (primary) hypertension: Secondary | ICD-10-CM

## 2015-09-22 DIAGNOSIS — E119 Type 2 diabetes mellitus without complications: Secondary | ICD-10-CM | POA: Diagnosis not present

## 2015-09-22 DIAGNOSIS — I2510 Atherosclerotic heart disease of native coronary artery without angina pectoris: Secondary | ICD-10-CM | POA: Diagnosis not present

## 2015-09-22 DIAGNOSIS — F32A Depression, unspecified: Secondary | ICD-10-CM

## 2015-09-22 LAB — URINALYSIS, ROUTINE W REFLEX MICROSCOPIC
Bilirubin Urine: NEGATIVE
KETONES UR: NEGATIVE
NITRITE: NEGATIVE
Total Protein, Urine: NEGATIVE
URINE GLUCOSE: NEGATIVE
UROBILINOGEN UA: 0.2 (ref 0.0–1.0)
pH: 6.5 (ref 5.0–8.0)

## 2015-09-22 LAB — BASIC METABOLIC PANEL
BUN: 15 mg/dL (ref 6–23)
CALCIUM: 10 mg/dL (ref 8.4–10.5)
CO2: 32 mEq/L (ref 19–32)
Chloride: 104 mEq/L (ref 96–112)
Creatinine, Ser: 0.91 mg/dL (ref 0.40–1.50)
GFR: 86.87 mL/min (ref >60.00)
GLUCOSE: 117 mg/dL — AB (ref 70–99)
Potassium: 4.4 mEq/L (ref 3.5–5.1)
SODIUM: 143 meq/L (ref 135–145)

## 2015-09-22 LAB — CBC WITH DIFFERENTIAL/PLATELET
Basophils Absolute: 0 10*3/uL (ref 0.0–0.1)
Basophils Relative: 0.6 % (ref 0.0–3.0)
EOS PCT: 2.5 % (ref 0.0–5.0)
Eosinophils Absolute: 0.2 10*3/uL (ref 0.0–0.7)
HCT: 44 % (ref 39.0–52.0)
HEMOGLOBIN: 14.8 g/dL (ref 13.0–17.0)
Lymphocytes Relative: 24.7 % (ref 12.0–46.0)
Lymphs Abs: 1.6 10*3/uL (ref 0.7–4.0)
MCHC: 33.6 g/dL (ref 30.0–36.0)
MCV: 85.3 fl (ref 78.0–100.0)
MONOS PCT: 11.1 % (ref 3.0–12.0)
Monocytes Absolute: 0.7 10*3/uL (ref 0.1–1.0)
Neutro Abs: 4 10*3/uL (ref 1.4–7.7)
Neutrophils Relative %: 61.1 % (ref 43.0–77.0)
Platelets: 162 10*3/uL (ref 150.0–400.0)
RBC: 5.16 Mil/uL (ref 4.22–5.81)
RDW: 15.7 % — ABNORMAL HIGH (ref 11.5–15.5)
WBC: 6.6 10*3/uL (ref 4.0–10.5)

## 2015-09-22 LAB — HEPATIC FUNCTION PANEL
ALBUMIN: 4.6 g/dL (ref 3.5–5.2)
ALK PHOS: 51 U/L (ref 39–117)
ALT: 14 U/L (ref 0–53)
AST: 13 U/L (ref 0–37)
Bilirubin, Direct: 0.4 mg/dL — ABNORMAL HIGH (ref 0.0–0.3)
Total Bilirubin: 1.9 mg/dL — ABNORMAL HIGH (ref 0.2–1.2)
Total Protein: 7.1 g/dL (ref 6.0–8.3)

## 2015-09-22 LAB — LIPID PANEL
CHOL/HDL RATIO: 2
CHOLESTEROL: 105 mg/dL (ref 0–200)
HDL: 42.5 mg/dL (ref >39.00)
LDL CALC: 41 mg/dL (ref 0–99)
NonHDL: 62.12
Triglycerides: 104 mg/dL (ref 0.0–149.0)
VLDL: 20.8 mg/dL (ref 0.0–40.0)

## 2015-09-22 LAB — MICROALBUMIN / CREATININE URINE RATIO
Creatinine,U: 51.2 mg/dL
MICROALB UR: 2.7 mg/dL — AB (ref 0.0–1.9)
Microalb Creat Ratio: 5.3 mg/g (ref 0.0–30.0)

## 2015-09-22 LAB — HEMOGLOBIN A1C: Hgb A1c MFr Bld: 6.2 % (ref 4.6–6.5)

## 2015-09-22 LAB — PSA: PSA: 1.27 ng/mL (ref 0.10–4.00)

## 2015-09-22 LAB — TSH: TSH: 6 u[IU]/mL — ABNORMAL HIGH (ref 0.35–4.50)

## 2015-09-22 NOTE — Assessment & Plan Note (Signed)
Mild worsening recently, Mild to mod, declines meds or counseling referral, to f/u any worsening symptoms or concerns

## 2015-09-22 NOTE — Assessment & Plan Note (Signed)
stable overall by history and exam, recent data reviewed with pt, and pt to continue medical treatment as before,  to f/u any worsening symptoms or concerns Lab Results  Component Value Date   HGBA1C 5.9* 06/13/2015

## 2015-09-22 NOTE — Patient Instructions (Addendum)
Please continue all other medications as before, and refills have been done if requested.  Please have the pharmacy call with any other refills you may need.  Please continue your efforts at being more active, low cholesterol diet, and weight control.  You are otherwise up to date with prevention measures today.  Please keep your appointments with your specialists as you may have planned  Please go to the XRAY Department in the Basement (go straight as you get off the elevator) for the x-ray testing  Please go to the LAB in the Basement (turn left off the elevator) for the tests to be done today  You will be contacted by phone if any changes need to be made immediately.  Otherwise, you will receive a letter about your results with an explanation, but please check with MyChart first.  Please remember to sign up for MyChart if you have not done so, as this will be important to you in the future with finding out test results, communicating by private email, and scheduling acute appointments online when needed.  Please return in 6 months, or sooner if needed  You will be seen per Charlean Sanfilippo for the Annual wellness Visit today as well   Mr. Domer , Thank you for taking time to come for your Medicare Wellness Visit. I appreciate your ongoing commitment to your health goals. Please review the following plan we discussed and let me know if I can assist you in the future.   These are the goals we discussed: Goals    . Exercise 150 minutes per week (moderate activity)     Will start silver sneaker program Will start water aerobics at lest x2 per week;        Try exercise; meditation and volunteering dogs;    This is a list of the screening recommended for you and due dates:  Health Maintenance  Topic Date Due  . Complete foot exam   03/20/2015  . Eye exam for diabetics  07/08/2015  . Pneumonia vaccines (2 of 2 - PPSV23) 09/08/2015  . Flu Shot  03/09/2016*  . Hemoglobin A1C   12/11/2015  . Urine Protein Check  03/09/2016  . Colon Cancer Screening  06/06/2017  . Tetanus Vaccine  06/06/2018  . Shingles Vaccine  Completed  *Topic was postponed. The date shown is not the original due date.

## 2015-09-22 NOTE — Progress Notes (Signed)
Pre visit review using our clinic review tool, if applicable. No additional management support is needed unless otherwise documented below in the visit note. 

## 2015-09-22 NOTE — Assessment & Plan Note (Signed)
Right mid chest, exam benign, non smoker, jan 2017 ecg reviewed, declines ecg today, but for cxr,

## 2015-09-22 NOTE — Assessment & Plan Note (Signed)
stable overall by history and exam, recent data reviewed with pt, and pt to continue medical treatment as before,  to f/u any worsening symptoms or concerns BP Readings from Last 3 Encounters:  09/22/15 140/80  07/21/15 160/80  07/02/15 136/78

## 2015-09-22 NOTE — Progress Notes (Signed)
Subjective:    Patient ID: Troy Banks, male    DOB: 1940/10/31, 75 y.o.   MRN: [id number]  HPI  Here for yearly f/u;  Overall doing ok;  Pt denies worsening SOB, DOE, wheezing, orthopnea, PND, worsening LE edema, palpitations, dizziness or syncope, but has had worsening recurrent right mid CP ongoing for several months, more freq and severe recently but non exertional, nonplueritic, nonpositional, not assoc with diaphoresis, n/v, palp or dizziness..  Pt denies neurological change such as new headache, facial or extremity weakness.  Pt denies polydipsia, polyuria, or low sugar symptoms. Pt states overall good compliance with treatment and medications, good tolerability, and has been trying to follow appropriate diet.  Pt has had mild worsening depressive symptoms but wife encourages no tx due to his C450 issues but no, suicidal ideation or panic. No fever, night sweats, wt loss, loss of appetite, or other constitutional symptoms.  Pt states good ability with ADL's, has low fall risk, home safety reviewed and adequate, no other significant changes in hearing or vision, and only occasionally active with exercise. Declines pneumovax Due for labs Past Medical History  Diagnosis Date  . COLONIC POLYPS, HX OF   . DIABETES MELLITUS     diet controlled  . DIVERTICULOSIS, COLON   . GOUT   . NEPHROLITHIASIS, HX OF     remote open stone-retrieval on L  . HYPERTENSION     off ACEI 2010 because of hyperkalemia in setting of ARI  . OSA on CPAP   . Cervical stenosis of spinal canal     fusion 2006  . Familial tremor 03/19/2014  . 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 Aortic stenosis, moderate 03/27/2015    post AVR echo Echo 11/16: Mild LVH, EF 55-60%, normal wall motion, grade 1 diastolic dysfunction, bioprosthetic AVR okay (mean gradient 18 mmHg) with trivial AI, mild LAE, atrial septal aneurysm, small effusion    Past Surgical  History  Procedure Laterality Date  . Strangulated testicle  1960  . Left great toe Left x2- 1985 & 2004  . Anterior fusion cervical spine  2002  . Hernia repair  1962  . Kidney stones  1986  . Posterior fusion cervical spine  2006  . Cardiac catheterization N/A 03/26/2015    Procedure: Left Heart Cath and Coronary Angiography;  Surgeon: Leonie Man, MD;  Location: [facility];  Service: Cardiovascular;  Laterality: N/A;  . Coronary artery bypass graft N/A 03/30/2015    Procedure: CORONARY ARTERY BYPASS GRAFTING (CABG) x four, using left internal mammary artery and left    leg greater saphenous vein harvested endoscopically ;  Surgeon: Grace Isaac, MD;  Location: [facility];  Service: Open Heart Surgery;  Laterality: N/A;  . Aortic valve replacement N/A 03/30/2015    Procedure: AORTIC VALVE REPLACEMENT (AVR);  Surgeon: Grace Isaac, MD;  Location: [facility];  Service: Open Heart Surgery;  Laterality: N/A;  . Tee without cardioversion N/A 03/30/2015    Procedure: TRANSESOPHAGEAL ECHOCARDIOGRAM (TEE);  Surgeon: Grace Isaac, MD;  Location: [facility];  Service: Open Heart Surgery;  Laterality: N/A;    reports that he quit smoking about 25 years ago. He does not have any smokeless tobacco history on file. He reports that he drinks alcohol. He reports that he does not use illicit drugs. family history includes Alcohol abuse in his other; Arthritis in his other; Diabetes in his  mother and paternal grandfather; Heart disease in his father, maternal grandfather, maternal grandmother, mother, and paternal grandfather; Hypertension in his father, maternal grandfather, maternal grandmother, mother, and paternal grandfather; Prostate cancer in his maternal uncle. Allergies  Allergen Reactions  . Benzodiazepines Other (See Comments)    Patient states he gets stroke symptoms with benzo's.   . Coreg [Carvedilol] Other (See Comments)    Patient poor metabolizer of CYP2D6 - Coreg undergoes  extensive hepatic (including 2D6)  . Diazepam Other (See Comments)    Stroke sx  . Lisinopril Other (See Comments)    Pt states C450, and hyperkalemia  . Mobic [Meloxicam] Other (See Comments)    Patient states he gets stroke symptoms with benzo's.   . Sertraline Hcl Other (See Comments)    Pt states all meds that are broken down with C-450- if so have sxs of stroke  . Tramadol Other (See Comments)    Unable to take C450  . Atorvastatin Other (See Comments)    MYALGIAS  . Acyclovir And Related Other (See Comments)   Current Outpatient Prescriptions on File Prior to Visit  Medication Sig Dispense Refill  . allopurinol (ZYLOPRIM) 100 MG tablet Take 1 tablet (100 mg total) by mouth 2 (two) times daily. 180 tablet 3  . amLODipine (NORVASC) 10 MG tablet Take 1 tablet (10 mg total) by mouth daily. 90 tablet 1  . Ascorbic Acid (VITAMIN C) 1000 MG tablet Take 1,000 mg by mouth daily.    [name] aspirin 81 MG tablet Take 81 mg by mouth daily.    . blood glucose meter kit and supplies KIT Use monitor to check blood sugar twice a day. DX E11.09 1 each 0  . Blood Glucose Monitoring Suppl (ONE TOUCH ULTRA 2) W/DEVICE KIT Test up to two times daily 1 each 1  . Cholecalciferol (VITAMIN D3) 2000 UNITS TABS Take 2,000 Units by mouth daily.    . clopidogrel (PLAVIX) 75 MG tablet Take 1 tablet (75 mg total) by mouth daily. 30 tablet 11  . cyanocobalamin 2000 MCG tablet Take 2,000 mcg by mouth daily.    . furosemide (LASIX) 40 MG tablet TAKE 1 TABLET BY MOUTH EVERY OTHER DAY 30 tablet 6  . glucose blood (ONE TOUCH TEST STRIPS) test strip Test up to two times daily 100 each 12  . Misc Natural Products (CURCUMAX PRO) TABS Take 1 tablet by mouth 2 (two) times daily.    . nabumetone (RELAFEN) 500 MG tablet Take 1 tablet (500 mg total) by mouth 2 (two) times daily as needed for moderate pain. 180 tablet 3  . ONE TOUCH LANCETS MISC Test up to two times daily 100 each 11  . primidone (MYSOLINE) 50 MG tablet Take 100  mg qam (2 pills) and 50 mg qpm (1 pill) 270 tablet 1  . ranitidine (ZANTAC) 150 MG tablet Take 1 tablet (150 mg total) by mouth 2 (two) times daily. 180 tablet 3  . rosuvastatin (CRESTOR) 10 MG tablet TAKE 1 TABLET BY MOUTH EVERY DAY 30 tablet 6   No current facility-administered medications on file prior to visit.   Review of Systems Constitutional: Negative for increased diaphoresis, or other activity, appetite or siginficant weight change other than noted HENT: Negative for worsening hearing loss, ear pain, facial swelling, mouth sores and neck stiffness.   Eyes: Negative for other worsening pain, redness or visual disturbance.  Respiratory: Negative for choking or stridor Cardiovascular: Negative for other chest pain and palpitations.  Gastrointestinal: Negative for worsening  diarrhea, blood in stool, or abdominal distention Genitourinary: Negative for hematuria, flank pain or change in urine volume.  Musculoskeletal: Negative for myalgias or other joint complaints.  Skin: Negative for other color change and wound or drainage.  Neurological: Negative for syncope and numbness. other than noted Hematological: Negative for adenopathy. or other swelling Psychiatric/Behavioral: Negative for hallucinations, SI, self-injury, decreased concentration or other worsening agitation.      Objective:   Physical Exam BP 140/80 mmHg  Pulse 68  Temp(Src) 98.4 F (36.9 C) (Oral)  Resp 20  Wt 217 lb (98.431 kg)  SpO2 97% VS noted,  Constitutional: Pt is oriented to person, place, and time. Appears well-developed and well-nourished, in no significant distress Head: Normocephalic and atraumatic  Eyes: Conjunctivae and EOM are normal. Pupils are equal, round, and reactive to light Right Ear: External ear normal.  Left Ear: External ear normal Nose: Nose normal.  Mouth/Throat: Oropharynx is clear and moist  Neck: Normal range of motion. Neck supple. No JVD present. No tracheal deviation present or  significant neck LA or mass Cardiovascular: Normal rate, regular rhythm, normal heart sounds and intact distal pulses.   Pulmonary/Chest: Effort normal and breath sounds without rales or wheezing  Abdominal: Soft. Bowel sounds are normal. NT. No HSM  Musculoskeletal: Normal range of motion. Exhibits no edema Lymphadenopathy: Has no cervical adenopathy.  Neurological: Pt is alert and oriented to person, place, and time. Pt has normal reflexes. No cranial nerve deficit. Motor grossly intact Skin: Skin is warm and dry. No rash noted or new ulcers Psychiatric:  Has normal mood and affect. Behavior is normal.     Assessment & Plan:

## 2015-09-23 ENCOUNTER — Other Ambulatory Visit: Payer: Self-pay | Admitting: Internal Medicine

## 2015-09-23 MED ORDER — LEVOTHYROXINE SODIUM 25 MCG PO TABS: 25.0000 ug | ORAL_TABLET | Freq: Every day | ORAL | Status: DC

## 2015-10-08 DIAGNOSIS — M1611 Unilateral primary osteoarthritis, right hip: Secondary | ICD-10-CM | POA: Diagnosis not present

## 2015-10-08 DIAGNOSIS — G5761 Lesion of plantar nerve, right lower limb: Secondary | ICD-10-CM | POA: Diagnosis not present

## 2015-10-08 DIAGNOSIS — M25551 Pain in right hip: Secondary | ICD-10-CM | POA: Diagnosis not present

## 2015-10-09 ENCOUNTER — Telehealth: Payer: Self-pay | Admitting: Cardiology

## 2015-10-09 NOTE — Telephone Encounter (Signed)
Spoke with [name], ok per DPR, who said her husband is getting a right anterior hip replacement on 10/27/15. She wanted to let us know he is having the surgery and will be stopping his plavix on 10/19/15. I advised the pt to have Dr Trevor Mace office at [facility] call to request a clearance. She said this decision was just made yesterday and that they would be contacting us she wanted to give [place] a heads up.

## 2015-10-09 NOTE — Telephone Encounter (Signed)
Attempted to call pt and DPR on file. No answer. Left message on Pt's phone number.

## 2015-10-09 NOTE — Telephone Encounter (Signed)
Follow up     Returning a call to the nurse.  Please call [name] back at [phone]

## 2015-10-09 NOTE — Telephone Encounter (Signed)
Per Pt's wife call  FYI:   Pt is going to have an anteriol hip 5/23 @ [facility] he will be stopping his Plavics 7 days before.

## 2015-10-14 ENCOUNTER — Telehealth: Payer: Self-pay

## 2015-10-14 NOTE — Telephone Encounter (Signed)
Left message on patient's personal voice mail Dr.Jordan advised ok to hold plavix 7 days prior to hip surgery scheduled 10/27/15.We never received a clearance from [employer].

## 2015-10-19 ENCOUNTER — Other Ambulatory Visit: Payer: Self-pay | Admitting: Orthopaedic Surgery

## 2015-10-20 NOTE — Pre-Procedure Instructions (Addendum)
Troy Banks  10/20/2015      [facility] [place] [place] - [place], [place] - [place] [address] [place] [address] Phone: [phone] Fax: [phone]    Your procedure is scheduled on  May 23rd, Tuesday   Report to [facility] Admitting at 1:00 PM  Call this number if you have problems the morning of surgery:[phone]  Call [phone] if questions prior to surgery date.   Remember:  Do not eat food or drink liquids after midnight Monday.   Take these medicines the morning of surgery with A SIP OF WATER : Norvasc (Amlodipine), allopurinol, Levothyroxine, Zantac, Primidone              4-5 days prior to surgery, STOP taking any aspirin, blood thinners, herbal supplements, vitamins, anti-inflammatories.   Do not wear jewelry - no rings or watches.  Do not wear lotions or colognes.       Men may shave face and neck.  Do not bring valuables to the hospital.  [facility] is not responsible for any belongings or valuables.  Contacts, dentures or bridgework may not be worn into surgery.  Leave your suitcase in the car.  After surgery it may be brought to your room.  Please read over the following fact sheets that you were given. Blood transfusion information, Mupirocin instruction sheet, CHG shower instructions

## 2015-10-21 ENCOUNTER — Encounter (HOSPITAL_COMMUNITY)
Admission: RE | Admit: 2015-10-21 | Discharge: 2015-10-21 | Disposition: A | Discharge status: Home / Self Care | Payer: Medicare Other | Source: Ambulatory Visit | Attending: Orthopaedic Surgery | Admitting: Orthopaedic Surgery

## 2015-10-21 ENCOUNTER — Encounter (HOSPITAL_COMMUNITY): Payer: Self-pay

## 2015-10-21 ENCOUNTER — Other Ambulatory Visit (HOSPITAL_COMMUNITY): Payer: Self-pay | Admitting: *Deleted

## 2015-10-21 DIAGNOSIS — Z79899 Other long term (current) drug therapy: Secondary | ICD-10-CM | POA: Diagnosis not present

## 2015-10-21 DIAGNOSIS — G4733 Obstructive sleep apnea (adult) (pediatric): Secondary | ICD-10-CM | POA: Diagnosis not present

## 2015-10-21 DIAGNOSIS — I2510 Atherosclerotic heart disease of native coronary artery without angina pectoris: Secondary | ICD-10-CM | POA: Insufficient documentation

## 2015-10-21 DIAGNOSIS — Z87891 Personal history of nicotine dependence: Secondary | ICD-10-CM | POA: Insufficient documentation

## 2015-10-21 DIAGNOSIS — K219 Gastro-esophageal reflux disease without esophagitis: Secondary | ICD-10-CM | POA: Diagnosis not present

## 2015-10-21 DIAGNOSIS — Z951 Presence of aortocoronary bypass graft: Secondary | ICD-10-CM | POA: Diagnosis not present

## 2015-10-21 DIAGNOSIS — Z7902 Long term (current) use of antithrombotics/antiplatelets: Secondary | ICD-10-CM | POA: Insufficient documentation

## 2015-10-21 DIAGNOSIS — Z0183 Encounter for blood typing: Secondary | ICD-10-CM | POA: Diagnosis not present

## 2015-10-21 DIAGNOSIS — I10 Essential (primary) hypertension: Secondary | ICD-10-CM | POA: Insufficient documentation

## 2015-10-21 DIAGNOSIS — Z952 Presence of prosthetic heart valve: Secondary | ICD-10-CM | POA: Diagnosis not present

## 2015-10-21 DIAGNOSIS — M1611 Unilateral primary osteoarthritis, right hip: Secondary | ICD-10-CM | POA: Diagnosis not present

## 2015-10-21 DIAGNOSIS — E119 Type 2 diabetes mellitus without complications: Secondary | ICD-10-CM | POA: Diagnosis not present

## 2015-10-21 DIAGNOSIS — Z7982 Long term (current) use of aspirin: Secondary | ICD-10-CM | POA: Diagnosis not present

## 2015-10-21 DIAGNOSIS — Z01812 Encounter for preprocedural laboratory examination: Secondary | ICD-10-CM | POA: Insufficient documentation

## 2015-10-21 DIAGNOSIS — G5761 Lesion of plantar nerve, right lower limb: Secondary | ICD-10-CM | POA: Diagnosis not present

## 2015-10-21 HISTORY — DX: Cardiac arrhythmia, unspecified: I49.9

## 2015-10-21 HISTORY — DX: Headache, unspecified: R51.9

## 2015-10-21 HISTORY — DX: Anxiety disorder, unspecified: F41.9

## 2015-10-21 HISTORY — DX: Calculus of kidney: N20.0

## 2015-10-21 HISTORY — DX: Acute myocardial infarction, unspecified: I21.9

## 2015-10-21 HISTORY — DX: Other complications of anesthesia, initial encounter: T88.59XA

## 2015-10-21 HISTORY — DX: Gastro-esophageal reflux disease without esophagitis: K21.9

## 2015-10-21 HISTORY — DX: Headache: R51

## 2015-10-21 HISTORY — DX: Hypothyroidism, unspecified: E03.9

## 2015-10-21 HISTORY — DX: Unspecified osteoarthritis, unspecified site: M19.90

## 2015-10-21 HISTORY — DX: Adverse effect of unspecified anesthetic, initial encounter: T41.45XA

## 2015-10-21 LAB — COMPREHENSIVE METABOLIC PANEL
ALBUMIN: 4.2 g/dL (ref 3.5–5.0)
ALK PHOS: 52 U/L (ref 38–126)
ALT: 20 U/L (ref 17–63)
AST: 19 U/L (ref 15–41)
Anion gap: 11 (ref 5–15)
BILIRUBIN TOTAL: 1.8 mg/dL — AB (ref 0.3–1.2)
BUN: 16 mg/dL (ref 6–20)
CO2: 28 mmol/L (ref 22–32)
Calcium: 9.7 mg/dL (ref 8.9–10.3)
Chloride: 105 mmol/L (ref 101–111)
Creatinine, Ser: 0.97 mg/dL (ref 0.61–1.24)
GFR calc Af Amer: >60 mL/min (ref >60)
GFR calc non Af Amer: >60 mL/min (ref >60)
GLUCOSE: 112 mg/dL — AB (ref 65–99)
Potassium: 4.3 mmol/L (ref 3.5–5.1)
Sodium: 144 mmol/L (ref 135–145)
TOTAL PROTEIN: 7.1 g/dL (ref 6.5–8.1)

## 2015-10-21 LAB — CBC
HEMATOCRIT: 44.4 % (ref 39.0–52.0)
HEMOGLOBIN: 15.1 g/dL (ref 13.0–17.0)
MCH: 29.8 pg (ref 26.0–34.0)
MCHC: 34 g/dL (ref 30.0–36.0)
MCV: 87.6 fL (ref 78.0–100.0)
Platelets: 159 10*3/uL (ref 150–400)
RBC: 5.07 MIL/uL (ref 4.22–5.81)
RDW: 14.3 % (ref 11.5–15.5)
WBC: 6 10*3/uL (ref 4.0–10.5)

## 2015-10-21 LAB — SURGICAL PCR SCREEN
MRSA, PCR: NEGATIVE
STAPHYLOCOCCUS AUREUS: POSITIVE — AB

## 2015-10-21 LAB — TYPE AND SCREEN
ABO/RH(D): B POS
ANTIBODY SCREEN: NEGATIVE

## 2015-10-21 NOTE — Progress Notes (Signed)
Pt with complicated history.  Veteran with PTSD.  States will wake up from anesthesia fighting if not woken slowly.  Cardiac history with MI/CABG and Valve replacement 03/30/15 Cardiologist is Dr [name]. Last seen in February. Echo and heart cath in [facility]. Denies stress test  Was instructed to stop plavix on Monday the 15th.  He will not be stopping the 81 mg aspirin but will hold day of surgery.  PCP Dr Cathlean Cower. 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 States has extreme problems with medication metabolism, causing stroke like symptoms if the wrong meds are given.  Anesthesia has been a problem in the past. Stated that Dr Oren Bracket had him for his last surgery and requests that he be with him this time.  Diet controlled diabetic.

## 2015-10-21 NOTE — Progress Notes (Signed)
Mupirocin Ointment Rx called into [facility] for positive PCR of Staph. Pt's wife notified and voiced understanding.

## 2015-10-22 NOTE — Progress Notes (Signed)
Anesthesia Chart Review:  Pt is a 75 year old male scheduled for R total hip arthroplasty anterior approach, steroid injection of R foot on 10/27/2015 with Dr. Kathrynn Speed.   PCP is [name]. Cardiologist is Dr. [name].   PMH includes:  CAD (s/p CABG x4 03/30/15), aortic stenosis (s/p replacement 03/30/15), dysrhythmia (was in afib post-CABG but is now maintaining NSR), HTN, DM (diet controlled), OSA, GERD. Former smoker. BMI 33  Anesthesia history includes:  - Pt is heterozygous for the cytochrome p450 CYP2D6 null variant; this deficiency can result in reduced metabolism of certain medications; please see Dr. Verneda Skill note 03/28/15.    - Pt reports he requires increased anesthesia ("takes a lot to put me under") and also pt has PTSD, reports "will wake up fighting" if emerges too rapidly.   Medications include: amlodipine, ASA, plavix, lasix, levothyroxine, primidone, zantac, crestor. Plavix stopped 10/19/15.   Preoperative labs reviewed.  Glucose 112. Last hgbA1c was 6.2 on 09/22/15.   Chest x-ray 09/22/15 reviewed. No active lung disease. Stable slight hyper aeration. Stable mild cardiomegaly.  ECG 06/18/15: NSR. Nonspecific ST and T wave abnormality. Prolonged QT  Echo 04/24/15:  - Left ventricle: The cavity size was normal. Wall thickness was increased in a pattern of mild LVH. Systolic function was normal. The estimated ejection fraction was in the range of 55% to 60%. Wall motion was normal; there were no regional wall motion abnormalities. Doppler parameters are consistent with abnormal left ventricular relaxation (grade 1 diastolic dysfunction). - Aortic valve: A bioprosthesis was present. There was trivial regurgitation. - Left atrium: The atrium was mildly dilated. - Atrial septum: There was an atrial septal aneurysm. - Pericardium, extracardiac: A small pericardial effusion was identified. - Impressions: Normal LV systolic function; grade 1 diastolic dysfunction; mild  LVH; s/p AVR with trace AI and mean gradient of 18 mmHg; mild LAE; trace MR.  Carotid duplex 03/28/15: Bilateral: 1-39% ICA stenosis  If no changes, I anticipate pt can proceed with surgery as scheduled.   Willeen Cass, FNP-BC [facility]/Anesthesiology Phone: [phone] 10/22/2015 3:47 PM

## 2015-10-23 LAB — HEMOGLOBIN A1C
HEMOGLOBIN A1C: 6.2 % — AB (ref 4.8–5.6)
Mean Plasma Glucose: 131 mg/dL

## 2015-10-26 MED ORDER — TRANEXAMIC ACID 1000 MG/10ML IV SOLN
1000.0000 mg | INTRAVENOUS | Status: DC
  Filled 2015-10-26: qty 10

## 2015-10-26 MED ORDER — CEFAZOLIN SODIUM-DEXTROSE 2-4 GM/100ML-% IV SOLN
2.0000 g | INTRAVENOUS | Status: AC
  Administered 2015-10-27: 2 g via INTRAVENOUS
  Filled 2015-10-26: qty 100

## 2015-10-27 ENCOUNTER — Inpatient Hospital Stay (HOSPITAL_COMMUNITY)
Admission: RE | Admit: 2015-10-27 | Discharge: 2015-10-29 | DRG: 470 | Disposition: A | Discharge status: Home / Self Care | Payer: Medicare Other | Source: Ambulatory Visit | Attending: Orthopaedic Surgery | Admitting: Orthopaedic Surgery

## 2015-10-27 ENCOUNTER — Encounter (HOSPITAL_COMMUNITY)
Admission: RE | Disposition: A | Discharge status: Home / Self Care | Payer: Self-pay | Source: Ambulatory Visit | Attending: Orthopaedic Surgery

## 2015-10-27 ENCOUNTER — Inpatient Hospital Stay (HOSPITAL_COMMUNITY): Payer: Medicare Other

## 2015-10-27 ENCOUNTER — Encounter (HOSPITAL_COMMUNITY): Payer: Self-pay | Admitting: Anesthesiology

## 2015-10-27 ENCOUNTER — Inpatient Hospital Stay (HOSPITAL_COMMUNITY): Payer: Medicare Other | Admitting: Anesthesiology

## 2015-10-27 ENCOUNTER — Inpatient Hospital Stay (HOSPITAL_COMMUNITY): Payer: Medicare Other | Admitting: Emergency Medicine

## 2015-10-27 DIAGNOSIS — Z951 Presence of aortocoronary bypass graft: Secondary | ICD-10-CM

## 2015-10-27 DIAGNOSIS — I110 Hypertensive heart disease with heart failure: Secondary | ICD-10-CM | POA: Diagnosis present

## 2015-10-27 DIAGNOSIS — Z87891 Personal history of nicotine dependence: Secondary | ICD-10-CM

## 2015-10-27 DIAGNOSIS — Z79899 Other long term (current) drug therapy: Secondary | ICD-10-CM | POA: Diagnosis not present

## 2015-10-27 DIAGNOSIS — F4310 Post-traumatic stress disorder, unspecified: Secondary | ICD-10-CM | POA: Diagnosis present

## 2015-10-27 DIAGNOSIS — Z0001 Encounter for general adult medical examination with abnormal findings: Secondary | ICD-10-CM

## 2015-10-27 DIAGNOSIS — E039 Hypothyroidism, unspecified: Secondary | ICD-10-CM | POA: Diagnosis present

## 2015-10-27 DIAGNOSIS — Z7982 Long term (current) use of aspirin: Secondary | ICD-10-CM | POA: Diagnosis not present

## 2015-10-27 DIAGNOSIS — E785 Hyperlipidemia, unspecified: Secondary | ICD-10-CM | POA: Diagnosis present

## 2015-10-27 DIAGNOSIS — I252 Old myocardial infarction: Secondary | ICD-10-CM

## 2015-10-27 DIAGNOSIS — Z96641 Presence of right artificial hip joint: Secondary | ICD-10-CM | POA: Diagnosis not present

## 2015-10-27 DIAGNOSIS — G5761 Lesion of plantar nerve, right lower limb: Secondary | ICD-10-CM | POA: Diagnosis present

## 2015-10-27 DIAGNOSIS — I5032 Chronic diastolic (congestive) heart failure: Secondary | ICD-10-CM | POA: Diagnosis present

## 2015-10-27 DIAGNOSIS — G4733 Obstructive sleep apnea (adult) (pediatric): Secondary | ICD-10-CM | POA: Diagnosis present

## 2015-10-27 DIAGNOSIS — M1611 Unilateral primary osteoarthritis, right hip: Secondary | ICD-10-CM | POA: Diagnosis not present

## 2015-10-27 DIAGNOSIS — K219 Gastro-esophageal reflux disease without esophagitis: Secondary | ICD-10-CM | POA: Diagnosis present

## 2015-10-27 DIAGNOSIS — Z886 Allergy status to analgesic agent status: Secondary | ICD-10-CM

## 2015-10-27 DIAGNOSIS — Z7902 Long term (current) use of antithrombotics/antiplatelets: Secondary | ICD-10-CM | POA: Diagnosis not present

## 2015-10-27 DIAGNOSIS — Z9109 Other allergy status, other than to drugs and biological substances: Secondary | ICD-10-CM

## 2015-10-27 DIAGNOSIS — E119 Type 2 diabetes mellitus without complications: Secondary | ICD-10-CM | POA: Diagnosis present

## 2015-10-27 DIAGNOSIS — M1000 Idiopathic gout, unspecified site: Secondary | ICD-10-CM | POA: Diagnosis present

## 2015-10-27 DIAGNOSIS — Z953 Presence of xenogenic heart valve: Secondary | ICD-10-CM

## 2015-10-27 DIAGNOSIS — Z471 Aftercare following joint replacement surgery: Secondary | ICD-10-CM | POA: Diagnosis not present

## 2015-10-27 DIAGNOSIS — M169 Osteoarthritis of hip, unspecified: Secondary | ICD-10-CM | POA: Diagnosis not present

## 2015-10-27 DIAGNOSIS — Z419 Encounter for procedure for purposes other than remedying health state, unspecified: Secondary | ICD-10-CM

## 2015-10-27 DIAGNOSIS — Z981 Arthrodesis status: Secondary | ICD-10-CM

## 2015-10-27 HISTORY — DX: Heart failure, unspecified: I50.9

## 2015-10-27 HISTORY — DX: Cardiac murmur, unspecified: R01.1

## 2015-10-27 HISTORY — DX: Pure hypercholesterolemia, unspecified: E78.00

## 2015-10-27 HISTORY — PX: TOTAL HIP ARTHROPLASTY: SHX124

## 2015-10-27 SURGERY — ARTHROPLASTY, HIP, TOTAL, ANTERIOR APPROACH
Anesthesia: Spinal | Laterality: Right

## 2015-10-27 MED ORDER — HYDROMORPHONE HCL 2 MG PO TABS: 1.0000 mg | ORAL_TABLET | Freq: Once | ORAL | Status: DC | PRN

## 2015-10-27 MED ORDER — FUROSEMIDE 40 MG PO TABS
40.0000 mg | ORAL_TABLET | ORAL | Status: DC
  Administered 2015-10-29: 40 mg via ORAL
  Filled 2015-10-27 (×3): qty 1

## 2015-10-27 MED ORDER — METHYLPREDNISOLONE ACETATE 40 MG/ML IJ SUSP
INTRAMUSCULAR | Status: AC
  Filled 2015-10-27: qty 1

## 2015-10-27 MED ORDER — ACETAMINOPHEN 325 MG PO TABS: 650.0000 mg | ORAL_TABLET | Freq: Four times a day (QID) | ORAL | Status: DC | PRN

## 2015-10-27 MED ORDER — LACTATED RINGERS IV SOLN
INTRAVENOUS | Status: DC | PRN
  Administered 2015-10-27 (×2): via INTRAVENOUS

## 2015-10-27 MED ORDER — ALLOPURINOL 100 MG PO TABS
100.0000 mg | ORAL_TABLET | Freq: Two times a day (BID) | ORAL | Status: DC
Start: 2015-10-27 — End: 2015-10-29
  Administered 2015-10-27 – 2015-10-29 (×4): 100 mg via ORAL
  Filled 2015-10-27 (×4): qty 1

## 2015-10-27 MED ORDER — CLOPIDOGREL BISULFATE 75 MG PO TABS
75.0000 mg | ORAL_TABLET | Freq: Every day | ORAL | Status: DC
  Administered 2015-10-28 – 2015-10-29 (×2): 75 mg via ORAL
  Filled 2015-10-27 (×2): qty 1

## 2015-10-27 MED ORDER — GLYCOPYRROLATE 0.2 MG/ML IJ SOLN
INTRAMUSCULAR | Status: DC | PRN
  Administered 2015-10-27: 0.2 mg via INTRAVENOUS

## 2015-10-27 MED ORDER — PHENOL 1.4 % MT LIQD: 1.0000 | OROMUCOSAL | Status: DC | PRN

## 2015-10-27 MED ORDER — FENTANYL CITRATE (PF) 250 MCG/5ML IJ SOLN
INTRAMUSCULAR | Status: AC
  Filled 2015-10-27: qty 5

## 2015-10-27 MED ORDER — LIDOCAINE HCL (CARDIAC) 20 MG/ML IV SOLN
INTRAVENOUS | Status: DC | PRN
  Administered 2015-10-27: 40 mg via INTRAVENOUS

## 2015-10-27 MED ORDER — BUPIVACAINE LIPOSOME 1.3 % IJ SUSP
20.0000 mL | INTRAMUSCULAR | Status: AC
  Administered 2015-10-27: 20 mL
  Filled 2015-10-27: qty 20

## 2015-10-27 MED ORDER — LABETALOL HCL 5 MG/ML IV SOLN
INTRAVENOUS | Status: AC
  Filled 2015-10-27: qty 4

## 2015-10-27 MED ORDER — ASPIRIN 81 MG PO TABS: 81.0000 mg | ORAL_TABLET | Freq: Every day | ORAL | Status: DC

## 2015-10-27 MED ORDER — PRIMIDONE 50 MG PO TABS
50.0000 mg | ORAL_TABLET | Freq: Every day | ORAL | Status: DC
Start: 2015-10-27 — End: 2015-10-29
  Administered 2015-10-27 – 2015-10-28 (×2): 50 mg via ORAL
  Filled 2015-10-27 (×2): qty 1

## 2015-10-27 MED ORDER — METOCLOPRAMIDE HCL 5 MG PO TABS
5.0000 mg | ORAL_TABLET | Freq: Three times a day (TID) | ORAL | Status: DC | PRN
Start: 2015-10-27 — End: 2015-10-29

## 2015-10-27 MED ORDER — ACETAMINOPHEN 650 MG RE SUPP: 650.0000 mg | Freq: Four times a day (QID) | RECTAL | Status: DC | PRN

## 2015-10-27 MED ORDER — 0.9 % SODIUM CHLORIDE (POUR BTL) OPTIME
TOPICAL | Status: DC | PRN
  Administered 2015-10-27: 1000 mL

## 2015-10-27 MED ORDER — ONDANSETRON HCL 4 MG PO TABS
2.0000 mg | ORAL_TABLET | Freq: Four times a day (QID) | ORAL | Status: DC | PRN
  Filled 2015-10-27: qty 1

## 2015-10-27 MED ORDER — PROPOFOL 10 MG/ML IV BOLUS
INTRAVENOUS | Status: DC | PRN
  Administered 2015-10-27: 20 mg via INTRAVENOUS
  Administered 2015-10-27: 30 mg via INTRAVENOUS

## 2015-10-27 MED ORDER — DOCUSATE SODIUM 100 MG PO CAPS
100.0000 mg | ORAL_CAPSULE | Freq: Two times a day (BID) | ORAL | Status: DC
Start: 2015-10-27 — End: 2015-10-29
  Administered 2015-10-27 – 2015-10-29 (×4): 100 mg via ORAL
  Filled 2015-10-27 (×4): qty 1

## 2015-10-27 MED ORDER — VITAMIN D3 50 MCG (2000 UT) PO TABS: 2000.0000 [IU] | ORAL_TABLET | Freq: Every day | ORAL | Status: DC

## 2015-10-27 MED ORDER — FENTANYL CITRATE (PF) 100 MCG/2ML IJ SOLN
INTRAMUSCULAR | Status: DC | PRN
  Administered 2015-10-27: 100 ug via INTRAVENOUS

## 2015-10-27 MED ORDER — PROPOFOL 500 MG/50ML IV EMUL
INTRAVENOUS | Status: DC | PRN
  Administered 2015-10-27: 50 ug/kg/min via INTRAVENOUS

## 2015-10-27 MED ORDER — PRIMIDONE 50 MG PO TABS
50.0000 mg | ORAL_TABLET | Freq: Two times a day (BID) | ORAL | Status: DC
  Filled 2015-10-27: qty 2

## 2015-10-27 MED ORDER — SODIUM CHLORIDE 0.9 % IV SOLN
INTRAVENOUS | Status: DC
  Administered 2015-10-27 – 2015-10-28 (×2): 75 mL/h via INTRAVENOUS

## 2015-10-27 MED ORDER — ASPIRIN 81 MG PO CHEW
81.0000 mg | CHEWABLE_TABLET | Freq: Every day | ORAL | Status: DC
  Administered 2015-10-28 – 2015-10-29 (×2): 81 mg via ORAL
  Filled 2015-10-27 (×2): qty 1

## 2015-10-27 MED ORDER — AMLODIPINE BESYLATE 10 MG PO TABS
10.0000 mg | ORAL_TABLET | Freq: Every day | ORAL | Status: DC
  Administered 2015-10-28 – 2015-10-29 (×2): 10 mg via ORAL
  Filled 2015-10-27 (×2): qty 1

## 2015-10-27 MED ORDER — LEVOTHYROXINE SODIUM 25 MCG PO TABS
25.0000 ug | ORAL_TABLET | Freq: Every day | ORAL | Status: DC
  Administered 2015-10-28 – 2015-10-29 (×2): 25 ug via ORAL
  Filled 2015-10-27 (×2): qty 1

## 2015-10-27 MED ORDER — LIDOCAINE 2% (20 MG/ML) 5 ML SYRINGE
INTRAMUSCULAR | Status: AC
  Filled 2015-10-27: qty 5

## 2015-10-27 MED ORDER — PROPOFOL 10 MG/ML IV BOLUS
INTRAVENOUS | Status: AC
  Filled 2015-10-27: qty 20

## 2015-10-27 MED ORDER — METHOCARBAMOL 1000 MG/10ML IJ SOLN
500.0000 mg | Freq: Four times a day (QID) | INTRAVENOUS | Status: DC | PRN
  Filled 2015-10-27: qty 5

## 2015-10-27 MED ORDER — METHOCARBAMOL 500 MG PO TABS: 500.0000 mg | ORAL_TABLET | Freq: Four times a day (QID) | ORAL | Status: DC | PRN

## 2015-10-27 MED ORDER — BUPIVACAINE HCL (PF) 0.25 % IJ SOLN
INTRAMUSCULAR | Status: AC
  Filled 2015-10-27: qty 30

## 2015-10-27 MED ORDER — BUPIVACAINE HCL (PF) 0.5 % IJ SOLN
INTRAMUSCULAR | Status: DC | PRN
  Administered 2015-10-27: 3 mL via INTRATHECAL

## 2015-10-27 MED ORDER — ONDANSETRON HCL 4 MG/2ML IJ SOLN: 2.0000 mg | Freq: Four times a day (QID) | INTRAMUSCULAR | Status: DC | PRN

## 2015-10-27 MED ORDER — SODIUM CHLORIDE 0.9 % IR SOLN
Status: DC | PRN
  Administered 2015-10-27: 1000 mL

## 2015-10-27 MED ORDER — LACTATED RINGERS IV SOLN
INTRAVENOUS | Status: DC
  Administered 2015-10-27: 13:00:00 via INTRAVENOUS

## 2015-10-27 MED ORDER — VITAMIN B-12 1000 MCG PO TABS
2000.0000 ug | ORAL_TABLET | Freq: Every day | ORAL | Status: DC
  Administered 2015-10-27 – 2015-10-29 (×3): 2000 ug via ORAL
  Filled 2015-10-27 (×3): qty 2

## 2015-10-27 MED ORDER — HYDROMORPHONE HCL 1 MG/ML IJ SOLN
1.0000 mg | INTRAMUSCULAR | Status: DC | PRN
  Administered 2015-10-27 – 2015-10-28 (×4): 1 mg via INTRAVENOUS
  Filled 2015-10-27 (×4): qty 1

## 2015-10-27 MED ORDER — DIPHENHYDRAMINE HCL 12.5 MG/5ML PO ELIX
12.5000 mg | ORAL_SOLUTION | ORAL | Status: DC | PRN
Start: 2015-10-27 — End: 2015-10-29

## 2015-10-27 MED ORDER — MIDAZOLAM HCL 2 MG/2ML IJ SOLN
INTRAMUSCULAR | Status: AC
  Filled 2015-10-27: qty 2

## 2015-10-27 MED ORDER — ONDANSETRON HCL 4 MG/2ML IJ SOLN
INTRAMUSCULAR | Status: AC
  Filled 2015-10-27: qty 2

## 2015-10-27 MED ORDER — PRIMIDONE 50 MG PO TABS
100.0000 mg | ORAL_TABLET | Freq: Every day | ORAL | Status: DC
  Administered 2015-10-28 – 2015-10-29 (×2): 100 mg via ORAL
  Filled 2015-10-27 (×2): qty 2

## 2015-10-27 MED ORDER — METHYLPREDNISOLONE ACETATE 40 MG/ML IJ SUSP
INTRAMUSCULAR | Status: DC | PRN
  Administered 2015-10-27: 2 mL via INTRAMUSCULAR

## 2015-10-27 MED ORDER — VITAMIN D 1000 UNITS PO TABS
2000.0000 [IU] | ORAL_TABLET | Freq: Every day | ORAL | Status: DC
  Administered 2015-10-27 – 2015-10-29 (×3): 2000 [IU] via ORAL
  Filled 2015-10-27 (×4): qty 2

## 2015-10-27 MED ORDER — VITAMIN C 500 MG PO TABS
1000.0000 mg | ORAL_TABLET | Freq: Every day | ORAL | Status: DC
  Administered 2015-10-27 – 2015-10-29 (×2): 1000 mg via ORAL
  Filled 2015-10-27 (×2): qty 2

## 2015-10-27 MED ORDER — HYDROMORPHONE HCL 2 MG PO TABS
2.0000 mg | ORAL_TABLET | ORAL | Status: DC | PRN
  Administered 2015-10-27 – 2015-10-29 (×8): 2 mg via ORAL
  Filled 2015-10-27 (×8): qty 1

## 2015-10-27 MED ORDER — METOCLOPRAMIDE HCL 5 MG/ML IJ SOLN
5.0000 mg | Freq: Three times a day (TID) | INTRAMUSCULAR | Status: DC | PRN
Start: 2015-10-27 — End: 2015-10-29

## 2015-10-27 MED ORDER — MENTHOL 3 MG MT LOZG: 1.0000 | LOZENGE | OROMUCOSAL | Status: DC | PRN

## 2015-10-27 MED ORDER — BUPIVACAINE HCL 0.25 % IJ SOLN
INTRAMUSCULAR | Status: DC | PRN
  Administered 2015-10-27: 20 mL

## 2015-10-27 MED ORDER — CEFAZOLIN SODIUM 1-5 GM-% IV SOLN
1.0000 g | Freq: Four times a day (QID) | INTRAVENOUS | Status: AC
  Administered 2015-10-27 – 2015-10-28 (×2): 1 g via INTRAVENOUS
  Filled 2015-10-27 (×2): qty 50

## 2015-10-27 MED ORDER — HYDROMORPHONE HCL 1 MG/ML IJ SOLN: 0.2500 mg | INTRAMUSCULAR | Status: DC | PRN

## 2015-10-27 SURGICAL SUPPLY — 51 items
APL SKNCLS STERI-STRIP NONHPOA (GAUZE/BANDAGES/DRESSINGS) ×1
BENZOIN TINCTURE PRP APPL 2/3 (GAUZE/BANDAGES/DRESSINGS) ×2 IMPLANT
BLADE SAW SGTL 18X1.27X75 (BLADE) ×2 IMPLANT
BLADE SURG ROTATE 9660 (MISCELLANEOUS) IMPLANT
CAPT HIP TOTAL 2 ×1 IMPLANT
CELLS DAT CNTRL 66122 CELL SVR (MISCELLANEOUS) ×1 IMPLANT
COVER SURGICAL LIGHT HANDLE (MISCELLANEOUS) ×2 IMPLANT
DRAPE C-ARM 42X72 X-RAY (DRAPES) ×2 IMPLANT
DRAPE STERI IOBAN 125X83 (DRAPES) ×2 IMPLANT
DRAPE U-SHAPE 47X51 STRL (DRAPES) ×6 IMPLANT
DRSG AQUACEL AG ADV 3.5X10 (GAUZE/BANDAGES/DRESSINGS) ×2 IMPLANT
DURAPREP 26ML APPLICATOR (WOUND CARE) ×2 IMPLANT
ELECT BLADE 4.0 EZ CLEAN MEGAD (MISCELLANEOUS) ×2
ELECT BLADE 6.5 EXT (BLADE) ×1 IMPLANT
ELECT REM PT RETURN 9FT ADLT (ELECTROSURGICAL) ×2
ELECTRODE BLDE 4.0 EZ CLN MEGD (MISCELLANEOUS) ×1 IMPLANT
ELECTRODE REM PT RTRN 9FT ADLT (ELECTROSURGICAL) ×1 IMPLANT
FACESHIELD WRAPAROUND (MASK) ×6 IMPLANT
FACESHIELD WRAPAROUND OR TEAM (MASK) ×2 IMPLANT
GLOVE BIOGEL PI IND STRL 8 (GLOVE) ×2 IMPLANT
GLOVE BIOGEL PI INDICATOR 8 (GLOVE) ×2
GLOVE ECLIPSE 8.0 STRL XLNG CF (GLOVE) ×2 IMPLANT
GLOVE ORTHO TXT STRL SZ7.5 (GLOVE) ×4 IMPLANT
GOWN STRL REUS W/ TWL LRG LVL3 (GOWN DISPOSABLE) ×2 IMPLANT
GOWN STRL REUS W/ TWL XL LVL3 (GOWN DISPOSABLE) ×2 IMPLANT
GOWN STRL REUS W/TWL LRG LVL3 (GOWN DISPOSABLE) ×4
GOWN STRL REUS W/TWL XL LVL3 (GOWN DISPOSABLE) ×4
HANDPIECE INTERPULSE COAX TIP (DISPOSABLE) ×2
KIT BASIN OR (CUSTOM PROCEDURE TRAY) ×2 IMPLANT
KIT ROOM TURNOVER OR (KITS) ×2 IMPLANT
MANIFOLD NEPTUNE II (INSTRUMENTS) ×2 IMPLANT
NS IRRIG 1000ML POUR BTL (IV SOLUTION) ×2 IMPLANT
PACK TOTAL JOINT (CUSTOM PROCEDURE TRAY) ×2 IMPLANT
PAD ARMBOARD 7.5X6 YLW CONV (MISCELLANEOUS) ×2 IMPLANT
RETRACTOR WND ALEXIS 18 MED (MISCELLANEOUS) ×1 IMPLANT
RTRCTR WOUND ALEXIS 18CM MED (MISCELLANEOUS) ×2
SET HNDPC FAN SPRY TIP SCT (DISPOSABLE) ×1 IMPLANT
STAPLER VISISTAT 35W (STAPLE) IMPLANT
STRIP CLOSURE SKIN 1/2X4 (GAUZE/BANDAGES/DRESSINGS) ×4 IMPLANT
SUT ETHIBOND NAB CT1 #1 30IN (SUTURE) ×3 IMPLANT
SUT MNCRL AB 4-0 PS2 18 (SUTURE) IMPLANT
SUT VIC AB 0 CT1 27 (SUTURE) ×2
SUT VIC AB 0 CT1 27XBRD ANBCTR (SUTURE) ×1 IMPLANT
SUT VIC AB 1 CT1 27 (SUTURE) ×2
SUT VIC AB 1 CT1 27XBRD ANBCTR (SUTURE) ×1 IMPLANT
SUT VIC AB 2-0 CT1 27 (SUTURE) ×2
SUT VIC AB 2-0 CT1 TAPERPNT 27 (SUTURE) ×1 IMPLANT
TOWEL OR 17X24 6PK STRL BLUE (TOWEL DISPOSABLE) ×2 IMPLANT
TOWEL OR 17X26 10 PK STRL BLUE (TOWEL DISPOSABLE) ×2 IMPLANT
TRAY FOLEY CATH 16FRSI W/METER (SET/KITS/TRAYS/PACK) IMPLANT
WATER STERILE IRR 1000ML POUR (IV SOLUTION) ×4 IMPLANT

## 2015-10-27 NOTE — Transfer of Care (Signed)
Immediate Anesthesia Transfer of Care Note  Patient: Troy Banks  Procedure(s) Performed: Procedure(s): RIGHT TOTAL HIP ARTHROPLASTY ANTERIOR APPROACH and steroid injection right foot (Right)  Patient Location: PACU  Anesthesia Type:Spinal  Level of Consciousness: awake  Airway & Oxygen Therapy: Patient Spontanous Breathing and Patient connected to nasal cannula oxygen  Post-op Assessment: Report given to RN and Post -op Vital signs reviewed and stable  Post vital signs: Reviewed and stable  Last Vitals:  Filed Vitals:   10/27/15 1257 10/27/15 1706  BP: 174/80 111/64  Pulse: 64 55  Temp: 36.9 C 36 C  Resp: 20 16    Last Pain:  Filed Vitals:   10/27/15 1717  PainSc: 0-No pain         Complications: No apparent anesthesia complications

## 2015-10-27 NOTE — H&P (Signed)
TOTAL HIP ADMISSION H&P  Patient is admitted for right total hip arthroplasty.  Subjective:  Chief Complaint: right hip pain  HPI: Troy Banks, 75 y.o. male, has a history of pain and functional disability in the right hip(s) due to arthritis and patient has failed non-surgical conservative treatments for greater than 12 weeks to include NSAID's and/or analgesics, corticosteriod injections, flexibility and strengthening excercises, use of assistive devices, weight reduction as appropriate and activity modification.  Onset of symptoms was gradual starting 5 years ago with gradually worsening course since that time.The patient noted no past surgery on the right hip(s).  Patient currently rates pain in the right hip at 10 out of 10 with activity. Patient has night pain, worsening of pain with activity and weight bearing, trendelenberg gait, pain that interfers with activities of daily living, pain with passive range of motion and crepitus. Patient has evidence of subchondral cysts, subchondral sclerosis, periarticular osteophytes and joint space narrowing by imaging studies. This condition presents safety issues increasing the risk of falls.  There is no current active infection.  Patient Active Problem List   Diagnosis Date Noted  . Osteoarthritis of right hip 10/27/2015  . Chronic chest pain 09/22/2015  . Acute gouty arthritis 06/11/2015  . CAD (coronary artery disease), native coronary artery 06/11/2015  . Chronic diastolic CHF (congestive heart failure) ([place]) 06/11/2015  . H/O tissue AVR Oct 2016 06/10/2015  . PAF post CABG/AVR- Amiodarone 06/10/2015  . Dyslipidemia 06/10/2015  . S/P CABG x 4-Oct 2016 03/30/2015  . H/O NSTEMI-Oct 2016 03/27/2015  . 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 Depression 09/08/2014  . GERD (gastroesophageal reflux disease)   . Essential tremor 03/31/2014  . Iliotibial band syndrome affecting left lower leg 01/01/2014  .  Spondylolisthesis at L4-L5 level 01/01/2014  . Sinus bradycardia 12/31/2013  . Greater trochanteric bursitis of right hip 12/04/2013  . OSA on CPAP   . Diet-controlled diabetes mellitus ([place]) 07/08/2009  . COLONIC POLYPS, HX OF 12/31/2008  . GOUT 12/22/2008  . Essential hypertension 12/22/2008  . DIVERTICULOSIS, COLON 12/22/2008  . NEPHROLITHIASIS, HX OF 12/22/2008   Past Medical History  Diagnosis Date  . COLONIC POLYPS, HX OF   . DIABETES MELLITUS     diet controlled  . DIVERTICULOSIS, COLON   . GOUT   . NEPHROLITHIASIS, HX OF     remote open stone-retrieval on L  . HYPERTENSION     off ACEI 2010 because of hyperkalemia in setting of ARI  . OSA on CPAP   . Cervical stenosis of spinal canal     fusion 2006  . Familial tremor 03/19/2014  . 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 Aortic stenosis, moderate 03/27/2015    post AVR echo Echo 11/16: Mild LVH, EF 55-60%, normal wall motion, grade 1 diastolic dysfunction, bioprosthetic AVR okay (mean gradient 18 mmHg) with trivial AI, mild LAE, atrial septal aneurysm, small effusion   . Complication of anesthesia     "takes a lot to put him under and has woken up during surgery" Difficult to wake up . PTSD  . Myocardial infarction [facility])     October 20th 2016  . Dysrhythmia     2 bouts of afib post op and at home but corrected with medication.  . Hypothyroidism     newly diaganosed 5/17  . Arthritis   . Headache   . GERD (gastroesophageal reflux  disease)     uses Zantac  . Kidney stones   . Anxiety     PTSD    Past Surgical History  Procedure Laterality Date  . Strangulated testicle  1960  . Left great toe Left x2- 1985 & 2004  . Anterior fusion cervical spine  2002  . Hernia repair  1962  . Kidney stones  1986  . Posterior fusion cervical spine  2006  . Cardiac catheterization N/A 03/26/2015    Procedure: Left Heart Cath and Coronary Angiography;  Surgeon:  Leonie Man, MD;  Location: [place] [facility];  Service: Cardiovascular;  Laterality: N/A;  . Coronary artery bypass graft N/A 03/30/2015    Procedure: CORONARY ARTERY BYPASS GRAFTING (CABG) x four, using left internal mammary artery and left    leg greater saphenous vein harvested endoscopically ;  Surgeon: Grace Isaac, MD;  Location: [place];  Service: Open Heart Surgery;  Laterality: N/A;  . Aortic valve replacement N/A 03/30/2015    Procedure: AORTIC VALVE REPLACEMENT (AVR);  Surgeon: Grace Isaac, MD;  Location: [place];  Service: Open Heart Surgery;  Laterality: N/A;  . Tee without cardioversion N/A 03/30/2015    Procedure: TRANSESOPHAGEAL ECHOCARDIOGRAM (TEE);  Surgeon: Grace Isaac, MD;  Location: [place];  Service: Open Heart Surgery;  Laterality: N/A;    Prescriptions prior to admission  Medication Sig Dispense Refill Last Dose  . allopurinol (ZYLOPRIM) 100 MG tablet Take 1 tablet (100 mg total) by mouth 2 (two) times daily. 180 tablet 3 Taking  . amLODipine (NORVASC) 10 MG tablet Take 1 tablet (10 mg total) by mouth daily. 90 tablet 1 Taking  . Ascorbic Acid (VITAMIN C) 1000 MG tablet Take 1,000 mg by mouth daily.   Taking  . aspirin 81 MG tablet Take 81 mg by mouth daily.   Taking  . Cholecalciferol (VITAMIN D3) 2000 UNITS TABS Take 2,000 Units by mouth daily.   Taking  . clopidogrel (PLAVIX) 75 MG tablet Take 1 tablet (75 mg total) by mouth daily. 30 tablet 11 Taking  . cyanocobalamin 2000 MCG tablet Take 2,000 mcg by mouth daily.   Taking  . furosemide (LASIX) 40 MG tablet TAKE 1 TABLET BY MOUTH EVERY OTHER DAY 30 tablet 6 Taking  . ibuprofen (ADVIL,MOTRIN) 200 MG tablet Take 600 mg by mouth every 6 (six) hours as needed for moderate pain.     [name] levothyroxine (LEVOTHROID) 25 MCG tablet Take 1 tablet (25 mcg total) by mouth daily before breakfast. 90 tablet 3   . OVER THE COUNTER MEDICATION Take 1 capsule by mouth 2 (two) times daily. Tumeric 520m     . primidone  (MYSOLINE) 50 MG tablet Take 100 mg qam (2 pills) and 50 mg qpm (1 pill) (Patient taking differently: Take 50-100 mg by mouth 2 (two) times daily. Take 100 mg qam (2 pills) and 50 mg qpm (1 pill)) 270 tablet 1 Taking  . ranitidine (ZANTAC) 150 MG tablet Take 1 tablet (150 mg total) by mouth 2 (two) times daily. 180 tablet 3 Taking  . rosuvastatin (CRESTOR) 10 MG tablet TAKE 1 TABLET BY MOUTH EVERY DAY 30 tablet 6 Taking  . blood glucose meter kit and supplies KIT Use monitor to check blood sugar twice a day. DX E11.09 (Patient not taking: Reported on 10/20/2015) 1 each 0 Taking  . Blood Glucose Monitoring Suppl (ONE TOUCH ULTRA 2) W/DEVICE KIT Test up to two times daily (Patient not taking: Reported on 10/20/2015) 1 each  1 Taking  . glucose blood (ONE TOUCH TEST STRIPS) test strip Test up to two times daily (Patient not taking: Reported on 10/20/2015) 100 each 12 Taking  . Misc Natural Products (CURCUMAX PRO) TABS Take 1 tablet by mouth 2 (two) times daily.   Taking  . nabumetone (RELAFEN) 500 MG tablet Take 1 tablet (500 mg total) by mouth 2 (two) times daily as needed for moderate pain. (Patient not taking: Reported on 10/20/2015) 180 tablet 3 Taking  . ONE TOUCH LANCETS MISC Test up to two times daily (Patient not taking: Reported on 10/20/2015) 100 each 11 Taking   Allergies  Allergen Reactions  . Benzodiazepines Other (See Comments)    Patient states he gets stroke symptoms with benzo's.   . Coreg [Carvedilol] Other (See Comments)    Patient poor metabolizer of CYP2D6 - Coreg undergoes extensive hepatic (including 2D6)  . Diazepam Other (See Comments)    Stroke sx  . Lisinopril Other (See Comments)    Pt states C450, and hyperkalemia  . Mobic [Meloxicam] Other (See Comments)    Unspecified  . Sertraline Hcl Other (See Comments)    Pt states all meds that are broken down with C-450- if so have sxs of stroke  . Tramadol Other (See Comments)    Unable to take C450  . Atorvastatin Other (See  Comments)    MYALGIAS  . Compazine [Prochlorperazine Maleate] Other (See Comments)    O676  . Ondansetron Other (See Comments)    C450  . Promethazine Other (See Comments)    C450  . Acyclovir And Related Other (See Comments)    Social History  Substance Use Topics  . Smoking status: Former Smoker    Quit date: 06/06/1990  . Smokeless tobacco: Not on file     Comment: widowed 1991 - lives with SO [name] who is Therapist, sports  . Alcohol Use: Yes     Comment: glass of wine once a week    Family History  Problem Relation Age of Onset  . Heart disease Mother   . Hypertension Mother   . Heart disease Father   . Hypertension Father   . Prostate cancer Maternal Uncle   . Hypertension Maternal Grandmother   . Heart disease Maternal Grandmother   . Hypertension Maternal Grandfather   . Heart disease Maternal Grandfather   . Hypertension Paternal Grandfather   . Heart disease Paternal Grandfather   . Diabetes Paternal Grandfather   . Alcohol abuse Other   . Arthritis Other   . Diabetes Mother      Review of Systems  Musculoskeletal: Positive for joint pain.  All other systems reviewed and are negative.   Objective:  Physical Exam  Constitutional: He is oriented to person, place, and time. He appears well-developed and well-nourished.  HENT:  Head: Normocephalic and atraumatic.  Eyes: EOM are normal. Pupils are equal, round, and reactive to light.  Neck: Normal range of motion. Neck supple.  Cardiovascular: Normal rate and regular rhythm.   Respiratory: Effort normal and breath sounds normal.  GI: Soft. Bowel sounds are normal.  Musculoskeletal:       Right hip: He exhibits decreased range of motion, decreased strength, tenderness and bony tenderness.  Neurological: He is alert and oriented to person, place, and time.  Skin: Skin is warm and dry.  Psychiatric: He has a normal mood and affect.    Vital signs in last 24 hours:    Labs:   Estimated body mass index is 33.00  kg/(m^2) as calculated from the following:   Height as of 07/21/15: 5' 8" (1.727 m).   Weight as of 09/22/15: 98.431 kg (217 lb).   Imaging Review Plain radiographs demonstrate severe degenerative joint disease of the right hip(s). The bone quality appears to be good for age and reported activity level.  Assessment/Plan:  End stage arthritis, right hip(s)  The patient history, physical examination, clinical judgement of the provider and imaging studies are consistent with end stage degenerative joint disease of the right hip(s) and total hip arthroplasty is deemed medically necessary. The treatment options including medical management, injection therapy, arthroscopy and arthroplasty were discussed at length. The risks and benefits of total hip arthroplasty were presented and reviewed. The risks due to aseptic loosening, infection, stiffness, dislocation/subluxation,  thromboembolic complications and other imponderables were discussed.  The patient acknowledged the explanation, agreed to proceed with the plan and consent was signed. Patient is being admitted for inpatient treatment for surgery, pain control, PT, OT, prophylactic antibiotics, VTE prophylaxis, progressive ambulation and ADL's and discharge planning.The patient is planning to be discharged home with home health services

## 2015-10-27 NOTE — Brief Op Note (Signed)
10/27/2015  4:56 PM  PATIENT:  [name]  75 y.o. male  PRE-OPERATIVE DIAGNOSIS:  Severe osteoarthritis right hip, Morton's Neuroma Right foot  POST-OPERATIVE DIAGNOSIS:  Severe osteoarthritis right hip, Morton's Neuroma Right foot  PROCEDURE:  Procedure(s): RIGHT TOTAL HIP ARTHROPLASTY ANTERIOR APPROACH and steroid injection right foot (Right)  SURGEON:  Surgeon(s) and Role:    * Mcarthur Rossetti, MD - Primary  PHYSICIAN ASSISTANT: Benita Stabile, [name]  ASSISTANTS: Fatima Sanger  - Medical Student  ANESTHESIA:   local and spinal  EBL:  Total I/O In: 1000 [I.V.:1000] Out: -   BLOOD ADMINISTERED:none  DRAINS: none   LOCAL MEDICATIONS USED:  MARCAINE    and OTHER Experil  SPECIMEN:  No Specimen  DISPOSITION OF SPECIMEN:  N/A  COUNTS:  YES  TOURNIQUET:  * No tourniquets in log *  DICTATION: .Other Dictation: Dictation Number [id number]  PLAN OF CARE: Admit to inpatient   PATIENT DISPOSITION:  PACU - hemodynamically stable.   Delay start of Pharmacological VTE agent (>24hrs) due to surgical blood loss or risk of bleeding: no

## 2015-10-27 NOTE — Anesthesia Procedure Notes (Signed)
Spinal Patient location during procedure: OR Staffing Anesthesiologist: Elih Mooney Performed by: anesthesiologist  Preanesthetic Checklist Completed: patient identified, site marked, surgical consent, pre-op evaluation, timeout performed, IV checked, risks and benefits discussed and monitors and equipment checked Spinal Block Patient position: sitting Prep: DuraPrep Patient monitoring: heart rate Approach: midline Location: L4-5 Needle Needle type: Pencan  Needle gauge: 27 G Assessment Sensory level: T6 Additional Notes Functioning IV was confirmed and monitors were applied. Sterile prep and drape, including hand hygiene, mask and sterile gloves were used. The patient was positioned and the spine was prepped. The skin was anesthetized with lidocaine.  Free flow of clear CSF was obtained prior to injecting local anesthetic into the CSF.  The spinal needle aspirated freely following injection.  The needle was carefully withdrawn.  The patient tolerated the procedure well. Consent was obtained prior to procedure with all questions answered and concerns addressed.  Troy Pink, MD

## 2015-10-27 NOTE — Progress Notes (Signed)
Dr Gifford Shave at bedside- no new orders received. Patient denying need for pain medication at this time. Repositioned and head of bed elevated with pillow behind shoulders.

## 2015-10-27 NOTE — Progress Notes (Signed)
Patient complaining of bladder distention and feeling of fullness. Will bladder scan and perform in and out cath as it was ordered to be done at the end of the case and was not done.

## 2015-10-27 NOTE — Progress Notes (Signed)
In and out cath performed. Output of 1024ml of clear yellow urine.

## 2015-10-27 NOTE — Anesthesia Preprocedure Evaluation (Signed)
Anesthesia Evaluation  Patient identified by MRN, date of birth, ID band Patient awake    Reviewed: Allergy & Precautions, H&P , NPO status , Patient's Chart, lab work & pertinent test results  History of Anesthesia Complications Negative for: history of anesthetic complications  Airway Mallampati: II  TM Distance: >3 FB Neck ROM: full    Dental no notable dental hx.    Pulmonary neg pulmonary ROS, sleep apnea , former smoker,    Pulmonary exam normal breath sounds clear to auscultation       Cardiovascular hypertension, + angina + CAD, + Past MI and + CABG  Normal cardiovascular exam+ Valvular Problems/Murmurs MR and AS  Rhythm:regular Rate:Normal  CABG and AV in 2016   Neuro/Psych Depression PTSDnegative neurological ROS     GI/Hepatic negative GI ROS, Neg liver ROS,   Endo/Other  negative endocrine ROSdiabetes  Renal/GU negative Renal ROS     Musculoskeletal   Abdominal   Peds  Hematology negative hematology ROS (+)   Anesthesia Other Findings Pt is CYPD2 heterozygote, refer to excellent pharmacy and Anesthesiology records from 03/28/15  Reproductive/Obstetrics negative OB ROS                             Anesthesia Physical  Anesthesia Plan  ASA: III  Anesthesia Plan: Spinal   Post-op Pain Management:    Induction: Intravenous  Airway Management Planned: Simple Face Mask  Additional Equipment:   Intra-op Plan:   Post-operative Plan: Extubation in OR  Informed Consent: I have reviewed the patients History and Physical, chart, labs and discussed the procedure including the risks, benefits and alternatives for the proposed anesthesia with the patient or authorized representative who has indicated his/her understanding and acceptance.   Dental Advisory Given  Plan Discussed with: Anesthesiologist, CRNA and Surgeon  Anesthesia Plan Comments: (Will plan to avoid precedex  per pharmacy guidelines -fent, midazolam, amicar, heparin, protamine, morphine, propofol all fine - he has best tolerated dilaudid PO 1-2mg  and Dilaudid IV, avoid all benzo's including midazolam if able due to CYPD concern and family concern over these medications  Spinal+ propofol and fentanyl based anesthetic with oral and IV dialudid post op as needed)        Anesthesia Quick Evaluation

## 2015-10-27 NOTE — Progress Notes (Signed)
RN spoke with patients wife [name] at the bedside. Wife informed RN to call her throughout the night if anything significant occurred with the patient. RN reassured wife that she would. RN and wife both exchanged numbers. Nursing will continue to monitor.

## 2015-10-27 NOTE — Op Note (Signed)
NAMECONARD, MCCROHAN NO.:  [id number]  MEDICAL RECORD NO.:  [id number]  LOCATION:  [id number]                        FACILITY:  [place]  PHYSICIAN:  Lind Guest. Ninfa Linden, M.D.DATE OF BIRTH:  07-28-1940  DATE OF PROCEDURE:  10/27/2015 DATE OF DISCHARGE:                              OPERATIVE REPORT   PREOPERATIVE DIAGNOSIS:  Severe primary osteoarthritis and degenerative joint disease, right hip.  POSTOPERATIVE DIAGNOSIS:  Severe primary osteoarthritis and degenerative joint disease, right hip.  PROCEDURE:  Right total hip arthroplasty through direct anterior approach.  IMPLANTS:  DePuy Sector Gription acetabular component size 56, size 36+ 0 neutral polyethylene liner, size 14 Corail femoral component with standard offset, size 36+ 5 ceramic hip ball.  SURGEON:  Lind Guest. Ninfa Linden, M.D.  ASSISTANT:  Erskine Emery, [name].  ANESTHESIA: 1. Spinal. 2. Local with mixture of Marcaine and Exparel.  BLOOD LOSS:  Less than 500 mL.  COMPLICATIONS:  None.  INDICATIONS:  Mr. Vanness is a 75 year old gentleman with severe end- stage arthritis involving his right hip.  This has been worsening over the last several years.  His pain is daily, it is detrimentally affected his activities of daily living, his quality of life and his mobility. It is the point that he has failed all forms of conservative treatment. He has seen his medical doctor, his cardiologist who agreed to allow him to proceed with total hip arthroplasty due to the detrimental effects that has had on his life.  He understands the risk of acute blood loss anemia, nerve and vessel injury, fracture, infection, dislocation, and DVT and his main risks are height and given his multiple medical problems.  He does wish to proceed with surgery, with our goals being decreased pain, improved mobility, and overall improved quality of life.  PROCEDURE DESCRIPTION:  After informed consent was obtained,  appropriate right hip was marked.  He was brought to the operating room and spinal anesthesia was obtained on his stretcher.  He was then laid supine on the stretcher and of note, he did want me to inject his foot for Morton's neuroma on the right side between the third and fourth interdigital space.  This was on the consent as well.  So, I cleaned this area with Betadine and alcohol and prior to injection of 1 mL of lidocaine and 1 mL of Depo-Medrol.  We then put traction boots on both of his feet and placed them supine on the Hana fracture table with the perineal post in place and both legs in inline skeletal traction devices, but no traction applied.  His right operative hip was prepped and draped with DuraPrep and sterile drapes.  Time-out was called, he was identified the correct patient and correct right hip.  We then made an incision inferior and posterior to the anterior superior iliac spine and carried this obliquely down the leg.  We dissected down the tensor fascia lata muscle.  Tensor fascia was then divided longitudinally, so we could proceed with direct anterior approach to the hip.  We identified and cauterized the lateral femoral circumflex vessels and then identified the hip capsule.  I opened up the hip capsule in an L- type format finding a large joint  effusion and significant periarticular osteophytes.  We placed a bent Hohmann over the medial acetabular rim and then made our femoral neck cut with an oscillating saw and completed this with an osteotome.  We placed a corkscrew guide in the femoral head and removed the femoral head in its entirety and found it to be completely devoid of cartilage.  I then cleaned the acetabulum and remnants of the acetabulum and other debris.  We then began reaming under direct visualization from a size 42 reamer in stepwise increments up to a size 56 with all reamers under direct visualization and the last reamer under direct  fluoroscopy, so we could obtain our depth of reaming, our inclination and anteversion.  We then placed the real DePuy Sector Gription acetabular component size 56 and a 36+ 0 polyethylene liner for that size acetabular component.  Attention was then turned to the femur.  With the leg externally rotated to 100 degrees, extended and adducted, we were able to place a Mueller retractor medially and a Hohmann retractor behind the greater trochanter.  We released the lateral joint capsule and used the box cutting osteotome to enter the femoral canal and a rongeur to lateralize.  We then began broaching from a size 8 broach using the Corail broaching system up to a size 14.  With size 14 in place, we trial a standard offset femoral neck and a 36+ 1.5 hip ball.  We brought the leg back over and up with traction and internal rotation reducing the pelvis and I was pleased with stability, but I felt like he needed a little bit more leg length and offset.  We dislocated the hip and removed the trial components.  I then placed the real Corail femoral component size 14 with standard offset and then real 36+ 1.5 ceramic hip ball, we reduced this in the acetabulum and then we were pleased with stability.  We then irrigated the soft tissue with normal saline solution using pulsatile lavage.  We closed the joint capsule with interrupted #1 Ethibond suture followed by running #1 Vicryl in the tensor fascia, 0 Vicryl in the deep tissue, 2-0 Vicryl in the subcutaneous tissue, 4-0 Monocryl subcuticular stitch, and Steri- Strips on the skin.  An Aquacel dressing was applied.  He was taken off the Hana table, taken to the recovery room in stable condition.  All final counts were correct.  There were no complications noted.  Of note, Erskine Emery, [name] assisted in the entire case, his assistance was crucial for facilitating all aspects of this case.     Lind Guest. Ninfa Linden, M.D.     CYB/MEDQ  D:   10/27/2015  T:  10/27/2015  Job:  [id number]

## 2015-10-27 NOTE — Progress Notes (Signed)
Patient complaining of chest pain on inspiration. States he "wants to take a good deep breath, but doesn't feel like he can because it is painful." BP 139/85, 100% on room air, Heart rate 45. Dr Gifford Shave notified and coming to see patient in PACU. Will continue to monitor.

## 2015-10-28 ENCOUNTER — Encounter (HOSPITAL_COMMUNITY): Payer: Self-pay | Admitting: Orthopaedic Surgery

## 2015-10-28 LAB — CBC
HEMATOCRIT: 38.9 % — AB (ref 39.0–52.0)
HEMOGLOBIN: 12.3 g/dL — AB (ref 13.0–17.0)
MCH: 28.5 pg (ref 26.0–34.0)
MCHC: 31.6 g/dL (ref 30.0–36.0)
MCV: 90.3 fL (ref 78.0–100.0)
Platelets: 141 10*3/uL — ABNORMAL LOW (ref 150–400)
RBC: 4.31 MIL/uL (ref 4.22–5.81)
RDW: 14.4 % (ref 11.5–15.5)
WBC: 11.8 10*3/uL — ABNORMAL HIGH (ref 4.0–10.5)

## 2015-10-28 LAB — BASIC METABOLIC PANEL
Anion gap: 10 (ref 5–15)
BUN: 12 mg/dL (ref 6–20)
CALCIUM: 9.3 mg/dL (ref 8.9–10.3)
CO2: 27 mmol/L (ref 22–32)
CREATININE: 0.94 mg/dL (ref 0.61–1.24)
Chloride: 99 mmol/L — ABNORMAL LOW (ref 101–111)
GFR calc non Af Amer: >60 mL/min (ref >60)
Glucose, Bld: 122 mg/dL — ABNORMAL HIGH (ref 65–99)
Potassium: 4.1 mmol/L (ref 3.5–5.1)
SODIUM: 136 mmol/L (ref 135–145)

## 2015-10-28 LAB — GLUCOSE, CAPILLARY: GLUCOSE-CAPILLARY: 95 mg/dL (ref 65–99)

## 2015-10-28 NOTE — Care Management Note (Signed)
Case Management Note  Patient Details  Name: Troy Banks MRN: [id number] Date of Birth: 09/11/1940  Subjective/Objective:              S/p right total hip arthroplasty      Action/Plan: Set up with [facility] for HHPT by MD office. Spoke with patient, he chose to work with [place]. Patient stated that his wife will be assisting him after discharge. Contacted James at [employer], rolling walker to be delivered to patient's room, patient does not want a 3N1.    Expected Discharge Date:                  Expected Discharge Plan:  [employer]  In-House Referral:  NA  Discharge planning Services  CM Consult  Post Acute Care Choice:  Durable Medical Equipment, Home Health Choice offered to:  Patient  DME Arranged:  Walker rolling DME Agency:  [place] Arranged:  PT HH Agency:  [place]  Status of Service:  Completed, signed off  Medicare Important Message Given:    Date Medicare IM Given:    Medicare IM give by:    Date Additional Medicare IM Given:    Additional Medicare Important Message give by:     If discussed at [place] of Stay Meetings, dates discussed:    Additional Comments:  Nila Nephew, RN 10/28/2015, 11:12 AM

## 2015-10-28 NOTE — Progress Notes (Signed)
Subjective: 1 Day Post-Op Procedure(s) (LRB): RIGHT TOTAL HIP ARTHROPLASTY ANTERIOR APPROACH and steroid injection right foot (Right) Patient reports pain as moderate.    Objective: Vital signs in last 24 hours: Temp:  [96.8 F (36 C)-99.2 F (37.3 C)] 99.2 F (37.3 C) (05/24 0525) Pulse Rate:  [44-78] 75 (05/24 0525) Resp:  [9-20] 17 (05/24 0525) BP: (111-174)/(58-90) 156/66 mmHg (05/24 0525) SpO2:  [93 %-100 %] 94 % (05/24 0525) Weight:  [97.523 kg (215 lb)] 97.523 kg (215 lb) (05/23 1257)  Intake/Output from previous day: 05/23 0701 - 05/24 0700 In: 1200 [I.V.:1200] Out: 1725 [Urine:1625; Blood:100] Intake/Output this shift: Total I/O In: -  Out: 1625 [Urine:1625]  No results for input(s): HGB in the last 72 hours. No results for input(s): WBC, RBC, HCT, PLT in the last 72 hours. No results for input(s): NA, K, CL, CO2, BUN, CREATININE, GLUCOSE, CALCIUM in the last 72 hours. No results for input(s): LABPT, INR in the last 72 hours.  Sensation intact distally Intact pulses distally Dorsiflexion/Plantar flexion intact Incision: scant drainage  Assessment/Plan: 1 Day Post-Op Procedure(s) (LRB): RIGHT TOTAL HIP ARTHROPLASTY ANTERIOR APPROACH and steroid injection right foot (Right) Up with therapy Discharge home with home health next 1-2 days  Troy Banks 10/28/2015, 6:58 AM

## 2015-10-28 NOTE — Progress Notes (Signed)
Physical Therapy Treatment Patient Details Name: Troy Banks MRN: [id number] DOB: Nov 15, 1940 Today's Date: 10/28/2015    History of Present Illness [name], 75 y.o. male, has a history of pain and functional disability in the right hip(s) due to arthritis and patient has failed non-surgical conservative treatments for greater than 12 weeks Pt is s/p R THR anterior approach.    PT Comments    Pt is making excellent progress with mobility. Pt is able to ambulate 160' with RW supervision. Pt requires min assist to complete home exercise program. Pt may be ready for d/c home tomorrow after PT training on steps. Pt will continue to benefit from acute skilled PT until D/c home.  Follow Up Recommendations  Home health PT (pt/spouse changed their mind and are now requesting HHPT)     Equipment Recommendations  Rolling walker with 5" wheels    Recommendations for Other Services OT consult     Precautions / Restrictions Precautions Precautions: Anterior Hip Restrictions RLE Weight Bearing: Weight bearing as tolerated    Mobility  Bed Mobility Overal bed mobility:  (NT-in recliner) Bed Mobility: Supine to Sit     Supine to sit: Min assist;HOB elevated     General bed mobility comments: min assit to slide R LE over in the bed  Transfers Overall transfer level: Needs assistance Equipment used: Rolling walker (2 wheeled) Transfers: Sit to/from Stand Sit to Stand: Min assist         General transfer comment: cues for hand placement and to increase forward weith shift over BOS  Ambulation/Gait Ambulation/Gait assistance: Supervision Ambulation Distance (Feet): 160 Feet Assistive device: Rolling walker (2 wheeled) Gait Pattern/deviations: Step-through pattern;Decreased weight shift to right;Decreased step length - left;Trunk flexed   Gait velocity interpretation: Below normal speed for age/gender     Stairs            Wheelchair Mobility     Modified Rankin (Stroke Patients Only)       Balance Overall balance assessment: Needs assistance Sitting-balance support: No upper extremity supported Sitting balance-Leahy Scale: Normal     Standing balance support: Bilateral upper extremity supported Standing balance-Leahy Scale: Fair                      Cognition Arousal/Alertness: Awake/alert Behavior During Therapy: WFL for tasks assessed/performed Overall Cognitive Status: Within Functional Limits for tasks assessed                      Exercises Total Joint Exercises Ankle Circles/Pumps: AROM;Strengthening;Both;15 reps;Supine Quad Sets: AROM;Strengthening;Right;10 reps;Seated Heel Slides: AAROM;Strengthening;Right;10 reps;Seated Hip ABduction/ADduction: AAROM;Strengthening;Right;10 reps;Seated Long Arc Quad: AROM;Strengthening;Right;10 reps;Seated    General Comments        Pertinent Vitals/Pain Pain Assessment: 0-10 Pain Score: 5  Pain Descriptors / Indicators: Discomfort Pain Intervention(s): Limited activity within patient's tolerance;Monitored during session    [place] expects to be discharged to:: Private residence Living Arrangements: Spouse/significant other Available Help at Discharge: Family;Available 24 hours/day Type of Home: House Home Access: Stairs to enter Entrance Stairs-Rails: Right;Left;None Home Layout: One level Home Equipment: Crutches      Prior Function Level of Independence: Independent          PT Goals (current goals can now be found in the care plan section) Acute Rehab PT Goals Patient Stated Goal: To be able to ride his motorcycle in 4 weeks PT Goal Formulation: With patient Time For Goal Achievement: 11/04/15 Potential to Achieve Goals:  Good Progress towards PT goals: Progressing toward goals    Frequency  7X/week    PT Plan Current plan remains appropriate    Co-evaluation             End of Session Equipment  Utilized During Treatment: Gait belt Activity Tolerance: Patient tolerated treatment well Patient left: in chair;with call bell/phone within reach;with family/visitor present     Time: [id number] PT Time Calculation (min) (ACUTE ONLY): 31 min  Charges:  $Gait Training: 8-22 mins $Therapeutic Exercise: 8-22 mins                    G Codes:      [name] 10/28/2015, 11:34 AM

## 2015-10-28 NOTE — Anesthesia Postprocedure Evaluation (Signed)
Anesthesia Post Note  Patient: Troy Banks  Procedure(s) Performed: Procedure(s) (LRB): RIGHT TOTAL HIP ARTHROPLASTY ANTERIOR APPROACH and steroid injection right foot (Right)  Patient location during evaluation: PACU Anesthesia Type: Spinal Level of consciousness: oriented and awake and alert Pain management: pain level controlled Vital Signs Assessment: post-procedure vital signs reviewed and stable Respiratory status: spontaneous breathing, respiratory function stable and patient connected to nasal cannula oxygen Cardiovascular status: blood pressure returned to baseline and stable Postop Assessment: no headache and no backache Anesthetic complications: no    Last Vitals:  Filed Vitals:   10/27/15 2300 10/28/15 0525  BP: 144/58 156/66  Pulse: 78 75  Temp: 37.1 C 37.3 C  Resp: 18 17    Last Pain:  Filed Vitals:   10/28/15 0622  PainSc: 7                  Catalina Gravel

## 2015-10-28 NOTE — Evaluation (Signed)
Occupational Therapy Evaluation Patient Details Name: Troy Banks MRN: [id number] DOB: [birth year] Today's Date: 10/28/2015    History of Present Illness 75 y.o. male s/p Rt direct anterior THA and steroid injection in right foot. PMH includes aortic stenosis, GERD, anxiety, DM, diverticulosis, gout, HTN, nephrolithiasis, OSA on CPAP, familial tremor, MI, dysrhythmia, hypothyroidism, arthritis, headache.    Clinical Impression   Pt s/p above. Education provided in session and wife available to assist at home. OT signing off.    Follow Up Recommendations  No OT follow up;Supervision - Intermittent    Equipment Recommendations  Other (comment) (AE if wanted; RW-2 wheels)    Recommendations for Other Services       Precautions / Restrictions Precautions Precautions: Fall Precaution Booklet Issued: No Restrictions Weight Bearing Restrictions: Yes RLE Weight Bearing: Weight bearing as tolerated      Mobility Bed Mobility         General bed mobility comments: not assessed  Transfers Overall transfer level: Needs assistance Transfers: Sit to/from Stand Sit to Stand: Min guard         General transfer comment: RW in front of pt prior to stand.    Balance Min guard for ambulation with RW; Mod A for simulated shower transfer.                          ADL Overall ADL's : Needs assistance/impaired                     Lower Body Dressing: Minimal assistance;With adaptive equipment;Sit to/from stand   Toilet Transfer: Min guard;RW;Ambulation (sit to stand from chair)       Tub/ Banker: Walk-in shower;Moderate assistance;Ambulation (used RW to ambulate close to simulated shower)   Functional mobility during ADLs: Rolling walker (Min guard for ambulation with RW; Mod A-shower transfer) General ADL Comments: Educated on LB dressing technique. Educated on AE and pt practiced with reacher and sock aid. Educated on shower transfer  technique and pt practiced. Discussed options for shower chair.      Vision     Perception     Praxis      Pertinent Vitals/Pain Pain Assessment: 0-10 Pain Score: 6  Pain Location: right hip Pain Descriptors / Indicators: Throbbing Pain Intervention(s): Monitored during session     Hand Dominance Left   Extremity/Trunk Assessment Upper Extremity Assessment Upper Extremity Assessment: Overall WFL for tasks assessed   Lower Extremity Assessment Lower Extremity Assessment: Defer to PT evaluation       Communication Communication Communication: No difficulties;HOH   Cognition Arousal/Alertness: Lethargic Behavior During Therapy: WFL for tasks assessed/performed Overall Cognitive Status: Within Functional Limits for tasks assessed                     General Comments       Exercises       Shoulder Instructions      Home Living Family/patient expects to be discharged to:: Private residence Living Arrangements: Spouse/significant other Available Help at Discharge: Family;Available 24 hours/day Type of Home: House Home Access: Stairs to enter [employer] of Steps: 4 Entrance Stairs-Rails: Right;Left;None Home Layout: One level     Bathroom Shower/Tub: Walk-in shower;Door   [employer]:  (elevated toilet)     Home Equipment: Crutches          Prior Functioning/Environment Level of Independence: Independent;Needs assistance    ADL's / Homemaking Assistance Needed: assist with LB  dressing-shoes and socks        OT Diagnosis: Acute pain   OT Problem List: Decreased strength;Decreased range of motion;Pain;Impaired balance (sitting and/or standing);Decreased activity tolerance   OT Treatment/Interventions:      OT Goals(Current goals can be found in the care plan section)    OT Frequency:     Barriers to D/C:            Co-evaluation              End of Session Equipment Utilized During Treatment: Gait  belt;Rolling walker;Other (comment) (AE) Nurse Communication: Mobility status  Activity Tolerance: Patient tolerated treatment well Patient left: in chair;with family/visitor present   Time: [id number] OT Time Calculation (min): 20 min Charges:  OT General Charges $OT Visit: 1 Procedure OT Evaluation $OT Eval Moderate Complexity: 1 Procedure G-CodesBenito Mccreedy OTR/L [id number] 10/28/2015, 1:55 PM

## 2015-10-28 NOTE — Progress Notes (Signed)
RN called patient's wife Troy Banks and informed wife of patients progress throughout the night. Patient rested well and pain was therapeutically managed using PO and IV Dilaudid. Patient voided several times throughout the night with standby assist WBAT using the urinal. Patient used CPAP on last night. Patient had several cups of soup broth and room temperature water. Patient is currently sleeping. Nursing will continue to monitor.

## 2015-10-28 NOTE — Evaluation (Signed)
Physical Therapy Evaluation Patient Details Name: Troy Banks MRN: [id number] DOB: Oct 03, 1940 Today's Date: 10/28/2015   History of Present Illness  Troy Banks, 75 y.o. male, has a history of pain and functional disability in the right hip(s) due to arthritis and patient has failed non-surgical conservative treatments for greater than 12 weeks Pt is s/p R THR anterior approach.  Clinical Impression  Pt presents with dependencies in mobility affecting his Independence secondary to R THR. Pt would benefit from skilled PT to maximize mobility and Independence for return home with his wife providing assistance as needed. Pt was able to ambulate 100Ft with RW min guard assist. Pt and spouse are requesting outpatient PT services after d/c home. Pt will benefit from continued acute PT until d/c home.    Follow Up Recommendations Outpatient PT    Equipment Recommendations  Rolling walker with 5" wheels    Recommendations for Other Services OT consult     Precautions / Restrictions Precautions Precautions: Anterior Hip Restrictions RLE Weight Bearing: Weight bearing as tolerated      Mobility  Bed Mobility Overal bed mobility: Needs Assistance Bed Mobility: Supine to Sit     Supine to sit: Min assist;HOB elevated     General bed mobility comments: min assit to slide R LE over in the bed  Transfers Overall transfer level: Needs assistance Equipment used: Rolling walker (2 wheeled) Transfers: Sit to/from Stand Sit to Stand: Min assist         General transfer comment: cues for hand placement and to increase forward weight shift  Ambulation/Gait Ambulation/Gait assistance: Min guard Ambulation Distance (Feet): 100 Feet Assistive device: Rolling walker (2 wheeled) Gait Pattern/deviations: Step-through pattern;Decreased step length - left;Decreased stance time - right;Trunk flexed   Gait velocity interpretation: Below normal speed for age/gender    Stairs             Wheelchair Mobility    Modified Rankin (Stroke Patients Only)       Balance Overall balance assessment: Needs assistance Sitting-balance support: No upper extremity supported Sitting balance-Leahy Scale: Normal     Standing balance support: Bilateral upper extremity supported Standing balance-Leahy Scale: Fair                               Pertinent Vitals/Pain Pain Assessment: 0-10 Pain Score: 5  Pain Descriptors / Indicators: Discomfort Pain Intervention(s): Limited activity within patient's tolerance;Monitored during session;Repositioned    Home Living Family/patient expects to be discharged to:: Private residence Living Arrangements: Spouse/significant other Available Help at Discharge: Family;Available 24 hours/day Type of Home: House Home Access: Stairs to enter Entrance Stairs-Rails: Right;Left;None Technical brewer of Steps: 4 Home Layout: One level Home Equipment: Crutches      Prior Function Level of Independence: Independent               Hand Dominance        Extremity/Trunk Assessment   Upper Extremity Assessment: Defer to OT evaluation           Lower Extremity Assessment: RLE deficits/detail RLE Deficits / Details: hip 2/5, ankle WFL, Knee flexion/extension 3/5       Communication   Communication: No difficulties  Cognition Arousal/Alertness: Awake/alert Behavior During Therapy: WFL for tasks assessed/performed Overall Cognitive Status: Within Functional Limits for tasks assessed                      General Comments  Exercises Total Joint Exercises Ankle Circles/Pumps: AROM;Strengthening;Both;10 reps;Supine Quad Sets: AROM;Strengthening;Right;5 reps;Supine Heel Slides: AAROM;Strengthening;Right;5 reps;Supine Long Arc Quad: AROM;Strengthening;Right;5 reps;Seated      Assessment/Plan    PT Assessment Patient needs continued PT services  PT Diagnosis Difficulty  walking;Acute pain   PT Problem List Decreased strength;Decreased activity tolerance;Decreased balance;Decreased mobility;Decreased knowledge of use of DME;Pain  PT Treatment Interventions DME instruction;Gait training;Stair training;Functional mobility training;Therapeutic activities;Patient/family education;Balance training;Therapeutic exercise   PT Goals (Current goals can be found in the Care Plan section) Acute Rehab PT Goals Patient Stated Goal: To be able to ride his motorcycle in 4 weeks PT Goal Formulation: With patient Time For Goal Achievement: 11/04/15 Potential to Achieve Goals: Good    Frequency 7X/week   Barriers to discharge        Co-evaluation               End of Session Equipment Utilized During Treatment: Gait belt Activity Tolerance: Patient tolerated treatment well Patient left: in chair;with call bell/phone within reach;with family/visitor present Nurse Communication: Mobility status         Time: [id number] PT Time Calculation (min) (ACUTE ONLY): 35 min   Charges:   PT Evaluation $PT Eval Moderate Complexity: 1 Procedure PT Treatments $Gait Training: 8-22 mins   PT G Codes:        Troy Banks 10/28/2015, 9:20 AM

## 2015-10-29 ENCOUNTER — Encounter (HOSPITAL_COMMUNITY): Payer: Self-pay | Admitting: General Practice

## 2015-10-29 MED ORDER — HYDROMORPHONE HCL 2 MG PO TABS: 2.0000 mg | ORAL_TABLET | ORAL | Status: DC | PRN

## 2015-10-29 NOTE — Progress Notes (Signed)
D/c instructions reviewed with pt and wife, copy of instructions and script given to pt. Pt ready for discharge.

## 2015-10-29 NOTE — Discharge Instructions (Signed)

## 2015-10-29 NOTE — Care Management Important Message (Signed)
Important Message  Patient Details  Name: Troy Banks MRN: [id number] Date of Birth: 1940-07-24   Medicare Important Message Given:  Yes    Loann Quill 10/29/2015, 12:22 PM

## 2015-10-29 NOTE — Discharge Summary (Signed)
Patient ID: Troy Banks MRN: [id number] DOB/AGE: September 17, 1940 75 y.o.  Admit date: 10/27/2015 Discharge date: 10/29/2015  Admission Diagnoses:  Principal Problem:   Osteoarthritis of right hip Active Problems:   Status post total replacement of right hip   Discharge Diagnoses:  Same  Past Medical History  Diagnosis Date  . COLONIC POLYPS, HX OF   . DIABETES MELLITUS     diet controlled  . DIVERTICULOSIS, COLON   . GOUT   . NEPHROLITHIASIS, HX OF     remote open stone-retrieval on L  . HYPERTENSION     off ACEI 2010 because of hyperkalemia in setting of ARI  . OSA on CPAP   . Cervical stenosis of spinal canal     fusion 2006  . Familial tremor 03/19/2014  . 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 Aortic stenosis, moderate 03/27/2015    post AVR echo Echo 11/16: Mild LVH, EF 55-60%, normal wall motion, grade 1 diastolic dysfunction, bioprosthetic AVR okay (mean gradient 18 mmHg) with trivial AI, mild LAE, atrial septal aneurysm, small effusion   . Complication of anesthesia     "takes a lot to put him under and has woken up during surgery" Difficult to wake up . PTSD  . Myocardial infarction [facility])     October 20th 2016  . Dysrhythmia     2 bouts of afib post op and at home but corrected with medication.  . Hypothyroidism     newly diaganosed 5/17  . Arthritis   . Headache   . GERD (gastroesophageal reflux disease)     uses Zantac  . Kidney stones   . Anxiety     PTSD    Surgeries: Procedure(s): RIGHT TOTAL HIP ARTHROPLASTY ANTERIOR APPROACH and steroid injection right foot on 10/27/2015   Consultants:    Discharged Condition: Improved  Hospital Course: Troy DILLION is an 75 y.o. male who was admitted 10/27/2015 for operative treatment ofOsteoarthritis of right hip. Patient has severe unremitting pain that affects sleep, daily activities, and work/hobbies. After pre-op clearance the patient  was taken to the operating room on 10/27/2015 and underwent  Procedure(s): RIGHT TOTAL HIP ARTHROPLASTY ANTERIOR APPROACH and steroid injection right foot.    Patient was given perioperative antibiotics: Anti-infectives    Start     Dose/Rate Route Frequency Ordered Stop   10/27/15 2130  ceFAZolin (ANCEF) IVPB 1 g/50 mL premix     1 g 100 mL/hr over 30 Minutes Intravenous Every 6 hours 10/27/15 1957 10/28/15 0341   10/27/15 1430  ceFAZolin (ANCEF) IVPB 2g/100 mL premix     2 g 200 mL/hr over 30 Minutes Intravenous To [facility] 10/26/15 1259 10/27/15 1535       Patient was given sequential compression devices, early ambulation, and chemoprophylaxis to prevent DVT.  Patient benefited maximally from hospital stay and there were no complications.    Recent vital signs: Patient Vitals for the past 24 hrs:  BP Temp Temp src Pulse Resp SpO2  10/29/15 0430 (!) 150/67 mmHg 98.2 F (36.8 C) Oral 71 16 93 %  10/28/15 1951 (!) 146/65 mmHg 98.8 F (37.1 C) Oral 79 17 92 %  10/28/15 1321 (!) 145/63 mmHg 99.1 F (37.3 C) - 76 16 94 %     Recent laboratory studies:  Recent Labs  10/28/15 0315  WBC 11.8*  HGB 12.3*  HCT 38.9*  PLT 141*  NA 136  K  4.1  CL 99*  CO2 27  BUN 12  CREATININE 0.94  GLUCOSE 122*  CALCIUM 9.3     Discharge Medications:     Medication List    TAKE these medications        allopurinol 100 MG tablet  Commonly known as:  ZYLOPRIM  Take 1 tablet (100 mg total) by mouth 2 (two) times daily.     amLODipine 10 MG tablet  Commonly known as:  NORVASC  Take 1 tablet (10 mg total) by mouth daily.     aspirin 81 MG tablet  Take 81 mg by mouth daily.     blood glucose meter kit and supplies Kit  Use monitor to check blood sugar twice a day. DX E11.09     clopidogrel 75 MG tablet  Commonly known as:  PLAVIX  Take 1 tablet (75 mg total) by mouth daily.     CURCUMAX PRO Tabs  Take 1 tablet by mouth 2 (two) times daily.     cyanocobalamin 2000  MCG tablet  Take 2,000 mcg by mouth daily.     furosemide 40 MG tablet  Commonly known as:  LASIX  TAKE 1 TABLET BY MOUTH EVERY OTHER DAY     glucose blood test strip  Commonly known as:  ONE TOUCH TEST STRIPS  Test up to two times daily     HYDROmorphone 2 MG tablet  Commonly known as:  DILAUDID  Take 1 tablet (2 mg total) by mouth every 3 (three) hours as needed for severe pain.     ibuprofen 200 MG tablet  Commonly known as:  ADVIL,MOTRIN  Take 600 mg by mouth every 6 (six) hours as needed for moderate pain.     levothyroxine 25 MCG tablet  Commonly known as:  LEVOTHROID  Take 1 tablet (25 mcg total) by mouth daily before breakfast.     nabumetone 500 MG tablet  Commonly known as:  RELAFEN  Take 1 tablet (500 mg total) by mouth 2 (two) times daily as needed for moderate pain.     ONE TOUCH LANCETS Misc  Test up to two times daily     ONE TOUCH ULTRA 2 w/Device Kit  Test up to two times daily     OVER THE COUNTER MEDICATION  Take 1 capsule by mouth 2 (two) times daily. Tumeric 571m     primidone 50 MG tablet  Commonly known as:  MYSOLINE  Take 100 mg qam (2 pills) and 50 mg qpm (1 pill)     ranitidine 150 MG tablet  Commonly known as:  ZANTAC  Take 1 tablet (150 mg total) by mouth 2 (two) times daily.     rosuvastatin 10 MG tablet  Commonly known as:  CRESTOR  TAKE 1 TABLET BY MOUTH EVERY DAY     vitamin C 1000 MG tablet  Take 1,000 mg by mouth daily.     Vitamin D3 2000 units Tabs  Take 2,000 Units by mouth daily.        Diagnostic Studies: Dg Hip Port Unilat With Pelvis 1v Right  10/27/2015  CLINICAL DATA:  Right total hip arthroplasty EXAM: DG HIP (WITH OR WITHOUT PELVIS) 1V PORT RIGHT COMPARISON:  10/08/2015 right hip radiographs FINDINGS: Status post right total hip arthroplasty, with well-positioned right acetabular and right proximal femoral prostheses. No right hip dislocation. No osseous fracture. No focal osseous lesion. Expected postsurgical gas  within and surrounding the right hip. Surgical clips overlie the left ischium. IMPRESSION: Satisfactory  appearance status post right total hip arthroplasty. Electronically Signed   By: Ilona Sorrel M.D.   On: 10/27/2015 18:54   Dg Hip Operative Unilat W Or W/o Pelvis Right  10/27/2015  CLINICAL DATA:  Status post right hip arthroplasty. EXAM: OPERATIVE RIGHT HIP (WITH PELVIS IF PERFORMED) 2 VIEWS TECHNIQUE: Fluoroscopic spot image(s) were submitted for interpretation post-operatively. COMPARISON:  None. FINDINGS: Two submitted images show a total right hip arthroplasty. The femoral acetabular components appear well seated and well aligned. There is no evidence of an acute fracture or operative complication. IMPRESSION: Well-positioned right hip arthroplasty. Electronically Signed   By: Lajean Manes M.D.   On: 10/27/2015 16:47    Disposition: 01-Home or Self Care      Discharge Instructions    Call MD / Call 911    Complete by:  As directed   If you experience chest pain or shortness of breath, CALL 911 and be transported to the hospital emergency room.  If you develope a fever above 101 F, pus (white drainage) or increased drainage or redness at the wound, or calf pain, call your surgeon's office.     Constipation Prevention    Complete by:  As directed   Drink plenty of fluids.  Prune juice may be helpful.  You may use a stool softener, such as Colace (over the counter) 100 mg twice a day.  Use MiraLax (over the counter) for constipation as needed.     Diet - low sodium heart healthy    Complete by:  As directed      Discharge patient    Complete by:  As directed      Increase activity slowly as tolerated    Complete by:  As directed            Follow-up Information    Follow up with [facility].   Why:  They will contact you to schedule home therapy visits.   Contact information:   [address] [place] [address] [phone]       Follow up with  Mcarthur Rossetti, MD In 2 weeks.   Specialty:  Orthopedic Surgery   Contact information:   [place] [address] [phone]        Signed: Mcarthur Rossetti 10/29/2015, 12:37 PM   !

## 2015-10-29 NOTE — Progress Notes (Signed)
Pt d/c'd via wheelchair with belongings with wife, escorted by unit NT.

## 2015-10-29 NOTE — Progress Notes (Signed)
Subjective: 2 Days Post-Op Procedure(s) (LRB): RIGHT TOTAL HIP ARTHROPLASTY ANTERIOR APPROACH and steroid injection right foot (Right) Patient reports pain as moderate.  Has done well with therapy.  Objective: Vital signs in last 24 hours: Temp:  [98.2 F (36.8 C)-99.1 F (37.3 C)] 98.2 F (36.8 C) (05/25 0430) Pulse Rate:  [71-79] 71 (05/25 0430) Resp:  [16-17] 16 (05/25 0430) BP: (145-150)/(63-67) 150/67 mmHg (05/25 0430) SpO2:  [92 %-94 %] 93 % (05/25 0430)  Intake/Output from previous day: 05/24 0701 - 05/25 0700 In: 880 [P.O.:880] Out: 1400 [Urine:1400] Intake/Output this shift: Total I/O In: 240 [P.O.:240] Out: -    Recent Labs  10/28/15 0315  HGB 12.3*    Recent Labs  10/28/15 0315  WBC 11.8*  RBC 4.31  HCT 38.9*  PLT 141*    Recent Labs  10/28/15 0315  NA 136  K 4.1  CL 99*  CO2 27  BUN 12  CREATININE 0.94  GLUCOSE 122*  CALCIUM 9.3   No results for input(s): LABPT, INR in the last 72 hours.  Sensation intact distally Intact pulses distally Dorsiflexion/Plantar flexion intact Incision: dressing C/D/I No cellulitis present Compartment soft  Assessment/Plan: 2 Days Post-Op Procedure(s) (LRB): RIGHT TOTAL HIP ARTHROPLASTY ANTERIOR APPROACH and steroid injection right foot (Right) Up with therapy Discharge home with home health today.  Mcarthur Rossetti 10/29/2015, 12:33 PM

## 2015-10-29 NOTE — Progress Notes (Signed)
Physical Therapy Treatment Patient Details Name: Troy Banks MRN: [id number] DOB: [birth year] Today's Date: 10/29/2015    History of Present Illness 75 y.o. male s/p Rt direct anterior THA and steroid injection in right foot. PMH includes aortic stenosis, GERD, anxiety, DM, diverticulosis, gout, HTN, nephrolithiasis, OSA on CPAP, familial tremor, MI, dysrhythmia, hypothyroidism, arthritis, headache.     PT Comments    Pt is making excellent progress with mobility. Pt is ambulating 450 on the unit with RW mod. Independent. Pt completed step training this morning and has a copy of his exercie program. Pt is ready for d/c home when Ortho clears pt. Please order a RW and HHPT follow-up.    Follow Up Recommendations  Home health PT     Equipment Recommendations  Rolling walker with 5" wheels    Recommendations for Other Services       Precautions / Restrictions Precautions Precautions: Fall;Anterior Hip Restrictions RLE Weight Bearing: Weight bearing as tolerated    Mobility  Bed Mobility Overal bed mobility:  (NT)                Transfers Overall transfer level: Needs assistance Equipment used: Rolling walker (2 wheeled) Transfers: Sit to/from Stand Sit to Stand: Supervision         General transfer comment: cues to increase forward weight shift for initial rise up.  Ambulation/Gait Ambulation/Gait assistance: Modified independent (Device/Increase time) Ambulation Distance (Feet): 450 Feet Assistive device: Rolling walker (2 wheeled) Gait Pattern/deviations: Step-through pattern;Decreased stride length;Decreased stance time - right;Decreased step length - left;Antalgic   Gait velocity interpretation: Below normal speed for age/gender     Stairs Stairs: Yes Stairs assistance: Supervision Stair Management: Two rails;Step to pattern;Forwards Number of Stairs: 5    Wheelchair Mobility    Modified Rankin (Stroke Patients Only)       Balance  Overall balance assessment: Needs assistance Sitting-balance support: No upper extremity supported Sitting balance-Leahy Scale: Normal     Standing balance support: Bilateral upper extremity supported Standing balance-Leahy Scale: Fair                      Cognition Arousal/Alertness: Awake/alert Behavior During Therapy: WFL for tasks assessed/performed Overall Cognitive Status: Within Functional Limits for tasks assessed                      Exercises Total Joint Exercises Ankle Circles/Pumps: AROM;Both;15 reps;Seated;Strengthening Quad Sets: AROM;Strengthening;Both;15 reps;Seated Heel Slides: AROM;Strengthening;Right;15 reps;Seated Hip ABduction/ADduction: AROM;Strengthening;Right;15 reps;Seated Long Arc Quad: AROM;Strengthening;Right;15 reps;Seated    General Comments General comments (skin integrity, edema, etc.): Pt reports he has a h/o R knee bursitis and he feels it may be "acting" up now. Pt does have R knee edema present.      Pertinent Vitals/Pain Pain Assessment: 0-10 Pain Score: 5  Pain Descriptors / Indicators: Discomfort Pain Intervention(s): Monitored during session;Limited activity within patient's tolerance;Repositioned    Home Living                      Prior Function            PT Goals (current goals can now be found in the care plan section) Progress towards PT goals: Progressing toward goals    Frequency  7X/week    PT Plan Current plan remains appropriate    Co-evaluation             End of Session Equipment Utilized During Treatment: Gait belt Activity Tolerance: Patient  tolerated treatment well Patient left: in chair;with call bell/phone within reach     Time: [id number] PT Time Calculation (min) (ACUTE ONLY): 43 min  Charges:  $Gait Training: 8-22 mins $Therapeutic Exercise: 8-22 mins $Therapeutic Activity: 8-22 mins                    G Codes:      [name] 10/29/2015, 10:37  AM

## 2015-10-30 ENCOUNTER — Telehealth: Payer: Self-pay

## 2015-10-30 DIAGNOSIS — Z471 Aftercare following joint replacement surgery: Secondary | ICD-10-CM | POA: Diagnosis not present

## 2015-10-30 DIAGNOSIS — M4316 Spondylolisthesis, lumbar region: Secondary | ICD-10-CM | POA: Diagnosis not present

## 2015-10-30 DIAGNOSIS — M1990 Unspecified osteoarthritis, unspecified site: Secondary | ICD-10-CM | POA: Diagnosis not present

## 2015-10-30 DIAGNOSIS — I110 Hypertensive heart disease with heart failure: Secondary | ICD-10-CM | POA: Diagnosis not present

## 2015-10-30 DIAGNOSIS — E119 Type 2 diabetes mellitus without complications: Secondary | ICD-10-CM | POA: Diagnosis not present

## 2015-10-30 DIAGNOSIS — I2510 Atherosclerotic heart disease of native coronary artery without angina pectoris: Secondary | ICD-10-CM | POA: Diagnosis not present

## 2015-10-30 NOTE — Telephone Encounter (Signed)
Pt is on TCM list. Pt was admitted for elective surgery. To follow up with surgeon.

## 2015-11-03 DIAGNOSIS — I2510 Atherosclerotic heart disease of native coronary artery without angina pectoris: Secondary | ICD-10-CM | POA: Diagnosis not present

## 2015-11-03 DIAGNOSIS — M1990 Unspecified osteoarthritis, unspecified site: Secondary | ICD-10-CM | POA: Diagnosis not present

## 2015-11-03 DIAGNOSIS — M4316 Spondylolisthesis, lumbar region: Secondary | ICD-10-CM | POA: Diagnosis not present

## 2015-11-03 DIAGNOSIS — Z471 Aftercare following joint replacement surgery: Secondary | ICD-10-CM | POA: Diagnosis not present

## 2015-11-03 DIAGNOSIS — E119 Type 2 diabetes mellitus without complications: Secondary | ICD-10-CM | POA: Diagnosis not present

## 2015-11-03 DIAGNOSIS — I110 Hypertensive heart disease with heart failure: Secondary | ICD-10-CM | POA: Diagnosis not present

## 2015-11-05 DIAGNOSIS — M25551 Pain in right hip: Secondary | ICD-10-CM | POA: Diagnosis not present

## 2015-11-06 DIAGNOSIS — Z471 Aftercare following joint replacement surgery: Secondary | ICD-10-CM | POA: Diagnosis not present

## 2015-11-06 DIAGNOSIS — I2510 Atherosclerotic heart disease of native coronary artery without angina pectoris: Secondary | ICD-10-CM | POA: Diagnosis not present

## 2015-11-06 DIAGNOSIS — M4316 Spondylolisthesis, lumbar region: Secondary | ICD-10-CM | POA: Diagnosis not present

## 2015-11-06 DIAGNOSIS — I110 Hypertensive heart disease with heart failure: Secondary | ICD-10-CM | POA: Diagnosis not present

## 2015-11-06 DIAGNOSIS — M1990 Unspecified osteoarthritis, unspecified site: Secondary | ICD-10-CM | POA: Diagnosis not present

## 2015-11-06 DIAGNOSIS — E119 Type 2 diabetes mellitus without complications: Secondary | ICD-10-CM | POA: Diagnosis not present

## 2015-11-06 NOTE — Telephone Encounter (Signed)
Received surgical clearance from [employer].Dr.Jordan cleared patient for upcoming right hip replacement.Form faxed back to fax # [phone].

## 2015-11-07 DIAGNOSIS — E119 Type 2 diabetes mellitus without complications: Secondary | ICD-10-CM | POA: Diagnosis not present

## 2015-11-07 DIAGNOSIS — M4316 Spondylolisthesis, lumbar region: Secondary | ICD-10-CM | POA: Diagnosis not present

## 2015-11-07 DIAGNOSIS — Z471 Aftercare following joint replacement surgery: Secondary | ICD-10-CM | POA: Diagnosis not present

## 2015-11-07 DIAGNOSIS — I2510 Atherosclerotic heart disease of native coronary artery without angina pectoris: Secondary | ICD-10-CM | POA: Diagnosis not present

## 2015-11-07 DIAGNOSIS — I110 Hypertensive heart disease with heart failure: Secondary | ICD-10-CM | POA: Diagnosis not present

## 2015-11-07 DIAGNOSIS — M1990 Unspecified osteoarthritis, unspecified site: Secondary | ICD-10-CM | POA: Diagnosis not present

## 2015-11-09 DIAGNOSIS — Z471 Aftercare following joint replacement surgery: Secondary | ICD-10-CM | POA: Diagnosis not present

## 2015-11-09 DIAGNOSIS — M1990 Unspecified osteoarthritis, unspecified site: Secondary | ICD-10-CM | POA: Diagnosis not present

## 2015-11-09 DIAGNOSIS — I110 Hypertensive heart disease with heart failure: Secondary | ICD-10-CM | POA: Diagnosis not present

## 2015-11-09 DIAGNOSIS — E119 Type 2 diabetes mellitus without complications: Secondary | ICD-10-CM | POA: Diagnosis not present

## 2015-11-09 DIAGNOSIS — M4316 Spondylolisthesis, lumbar region: Secondary | ICD-10-CM | POA: Diagnosis not present

## 2015-11-09 DIAGNOSIS — I2510 Atherosclerotic heart disease of native coronary artery without angina pectoris: Secondary | ICD-10-CM | POA: Diagnosis not present

## 2015-11-16 ENCOUNTER — Other Ambulatory Visit: Payer: Self-pay | Admitting: Neurology

## 2015-11-16 NOTE — Telephone Encounter (Signed)
Primidone refill requested. Per last office note- patient to remain on medication. Refill approved and sent to patient's pharmacy.   

## 2015-12-18 ENCOUNTER — Encounter: Payer: Self-pay | Admitting: Neurology

## 2015-12-18 ENCOUNTER — Ambulatory Visit (INDEPENDENT_AMBULATORY_CARE_PROVIDER_SITE_OTHER): Payer: Medicare Other | Admitting: Neurology

## 2015-12-18 VITALS — BP 148/78 | HR 65 | Ht 68.0 in | Wt 207.0 lb

## 2015-12-18 DIAGNOSIS — G250 Essential tremor: Secondary | ICD-10-CM

## 2015-12-18 DIAGNOSIS — R253 Fasciculation: Secondary | ICD-10-CM

## 2015-12-18 DIAGNOSIS — I2510 Atherosclerotic heart disease of native coronary artery without angina pectoris: Secondary | ICD-10-CM

## 2015-12-18 NOTE — Progress Notes (Signed)
Subjective:   Troy Banks was seen in consultation in the movement disorder clinic at the request of Cathlean Cower, MD.  The evaluation is for tremor.  The records that were made available to me were reviewed.  Pt is accompanied by his significant other who supplements the hx.    The patient is a 75 y.o. left handed male with a history of tremor.  Pt reports tremor for at least 30 years.  Pt has noted increasing tremor over the years. Tremor is mostly in the right hand, but it is in the left as well and more rarely in the head as well.  Stress brings out the tremor.   He has never been to a neurologist for tremor.   Is currently on small amount of metoprolol - 25 mg - but that cannot be increased due to hx of bradycardia.  There is a family hx of tremor in his mother, maternal GM, maternal sister and maternal cousins.    Affected by caffeine:  No. (only drinks 2 cups coffee per week; drinks green tea) Affected by alcohol:  Used to decrease tremor but doesn't seem to any longer Affected by stress:  Yes.   Affected by fatigue:  Yes.   Spills soup if on spoon:  No. but has trouble with corn/peas Spills glass of liquid if full:  Yes.   Affects ADL's (tying shoes, brushing teeth, etc):  Yes.   (slow to tie shoes)  06/30/14 update:  Last visit, we started the patient on primidone for his tremor.  He cannot take Topamax because of history of nephrolithiasis or beta blockers because of bradycardia.  The patient reports that he is overall doing well.  For the first time in years, he was able to stand up and put on his jeans as he thinks that the medication helped his balance as well as tremor.  He has good and bad days in terms of tremor.  He has no SE in terms of tremor.  He states though that his metoprolol has been cut back since last visit because of bradycardia.    10/08/14 update:  Pt is on primidone 71m bid.  Pt states that initially it seemed helpful but nowl having tremor; had an episode last  week where couldn't shave because of tremor (not unusual to see tremor while shaving but almost unable to shave) and then whole body started shaking.  Lasted all day.  He had more word finding issues that day (has baseline word finding issues).  States that "I am concerned about something beyond familial tremor."  He is worried about PD.  Having cramping in the fingers and sometimes in the toes. Hands/fingers more than the feet.  He notices that his right arm "jerks", but thinks that that is due to "a botched neck surgery."  No significant issues with swallowing.  No diplopia.  10/30/14 update:  The patient is seen today, accompanied by his wife who supplements the history.  He had an MRI of the brain on 10/24/2014 that was unremarkable with exception of mild small vessel disease.  He had an MRI of the cervical spine demonstrating stenosis at several levels, but especially at the C6-C7 level and C3-C4 levels.  There was questionable thyroid enlargement and he was asked to follow up with his primary care physician in that regard.  On 10/28/2014 he underwent an EMG at our office.  This suggested chronic on active changes and intraspinal lesion at the right C6 level and  right S1 levels.  The etiology of this is unknown, but it could be anywhere from compressive etiologies to degeneration of the anterior horn cells.  He does not meet criteria for motor neuron disease at this point in time.  In regards to tremor, he has a few what he calls "episodes" in which his arms or legs will tremor more and may last 45 seconds.  BS was normal.  No known stress sets them off.  He may have a stuttering speech when it happens.    03/17/15 update:  The patient presents for follow-up today.  He is on primidone, 50 mg bid for essential tremor. Tremor has picked up but mostly with stress.  He thinks that we need to increase the primidone.   He is not a candidate for Topamax because of nephrolithiasis and is not a candidate for beta  blockers because of bradycardia.  We have worked him up extensively because of fasciculations.   No falls but "my balance is way off."   He had an MRI of the lumbar spine after we last met.  This was done on June 7.  There was evidence of severe facet arthrosis at the L4-L5 level and L5-S1 levels.  I told him that these findings do not explain all of his findings, including fasciculations.  He had an EEG on June 2 that was unremarkable.  This was done because of episodes of transient alteration of awareness that his wife had described.  His wife states that they didn't do the prolonged EEG because his insurance wouldn't allow it without a significant copay.  They are planning on r/s soon.  07/21/15 update:  The patient is seen today in follow-up, accompanied by his wife who supplements the history.  Much has happened since our last visit and I did review records.  Last visit, I increased his primidone to 100 mg in the morning and 50 mg at night.  He ended up being hospitalized in October, 2016 with an NSTEMI and ultimately required a coronary artery bypass graft and aortic valve replacement on 03/30/2015.  While in the hospital for this, he developed atrial fibrillation and was started on amiodarone.  Not surprisingly, this caused a significant increase in tremor.  He was rehospitalized in January, 2017 for acute gout and while he was in there he mentioned that tremor had increased.  His amiodarone was stopped.  He did have a repeat EMG since our last visit and this looked much better, without evidence of motor neuron disease.  He does feel that his balance has been a bit off.  Did not do cardiac rehab after CABG.    12/18/15 update:  The patient is seen today in follow-up.  He is accompanied by his wife who supplements the history.  I have reviewed prior records made available to me.  He remains on primidone, 100 mg in the morning and 50 mg at night. Been after amiodarone for 7 months and at the beginning of May,  he felt that tremor was much better but after hip surgery, felt that he regressed.  Surgery done under spinal anesthesia.  He had a total hip replacement on 10/27/2015.  Only did a week of therapy after that.  Felt that balance and tremor was worse after surgery.  Been losing weight through exercise.  Was put on synthroid 3 months ago.  Current/Previously tried tremor medications: neurontin - was up to 300 mg bid for rotator cuff issues but never seemed to help tremor;  back on it at low dose for hip pain.    Outside reports reviewed: historical medical records and referral letter/letters.  Allergies  Allergen Reactions  . Benzodiazepines Other (See Comments)    Patient states he gets stroke symptoms with benzo's.   . Coreg [Carvedilol] Other (See Comments)    Patient poor metabolizer of CYP2D6 - Coreg undergoes extensive hepatic (including 2D6)  . Diazepam Other (See Comments)    Stroke sx  . Lisinopril Other (See Comments)    Pt states C450, and hyperkalemia  . Mobic [Meloxicam] Other (See Comments)    Unspecified  . Sertraline Hcl Other (See Comments)    Pt states all meds that are broken down with C-450- if so have sxs of stroke  . Tramadol Other (See Comments)    Unable to take C450  . Atorvastatin Other (See Comments)    MYALGIAS  . Compazine [Prochlorperazine Maleate] Other (See Comments)    Y814  . Ondansetron Other (See Comments)    C450  . Promethazine Other (See Comments)    C450  . Acyclovir And Related Other (See Comments)    Outpatient Encounter Prescriptions as of 12/18/2015  Medication Sig  . allopurinol (ZYLOPRIM) 100 MG tablet Take 1 tablet (100 mg total) by mouth 2 (two) times daily.  [name] amLODipine (NORVASC) 10 MG tablet Take 1 tablet (10 mg total) by mouth daily.  . Ascorbic Acid (VITAMIN C) 1000 MG tablet Take 1,000 mg by mouth daily.  [name] aspirin 81 MG tablet Take 81 mg by mouth daily.  . blood glucose meter kit and supplies KIT Use monitor to check blood sugar  twice a day. DX E11.09  . Blood Glucose Monitoring Suppl (ONE TOUCH ULTRA 2) W/DEVICE KIT Test up to two times daily  . Cholecalciferol (VITAMIN D3) 2000 UNITS TABS Take 2,000 Units by mouth daily.  . clopidogrel (PLAVIX) 75 MG tablet Take 1 tablet (75 mg total) by mouth daily.  . cyanocobalamin 2000 MCG tablet Take 2,000 mcg by mouth daily.  . furosemide (LASIX) 40 MG tablet TAKE 1 TABLET BY MOUTH EVERY OTHER DAY  . glucose blood (ONE TOUCH TEST STRIPS) test strip Test up to two times daily  . ibuprofen (ADVIL,MOTRIN) 200 MG tablet Take 600 mg by mouth every 6 (six) hours as needed for moderate pain.  [name] levothyroxine (LEVOTHROID) 25 MCG tablet Take 1 tablet (25 mcg total) by mouth daily before breakfast.  . Misc Natural Products (CURCUMAX PRO) TABS Take 1 tablet by mouth 2 (two) times daily.  . ONE TOUCH LANCETS MISC Test up to two times daily  . OVER THE COUNTER MEDICATION Take 1 capsule by mouth 2 (two) times daily. Tumeric 536m  . primidone (MYSOLINE) 50 MG tablet TAKE 2 TABLETS BY MOUTH EVERY MORNING AND TAKE 1 TABLET IN THE EVENING  . ranitidine (ZANTAC) 150 MG tablet Take 1 tablet (150 mg total) by mouth 2 (two) times daily.  . rosuvastatin (CRESTOR) 10 MG tablet TAKE 1 TABLET BY MOUTH EVERY DAY  . [DISCONTINUED] HYDROmorphone (DILAUDID) 2 MG tablet Take 1 tablet (2 mg total) by mouth every 3 (three) hours as needed for severe pain.  . [DISCONTINUED] nabumetone (RELAFEN) 500 MG tablet Take 1 tablet (500 mg total) by mouth 2 (two) times daily as needed for moderate pain.   No facility-administered encounter medications on file as of 12/18/2015.    Past Medical History  Diagnosis Date  . COLONIC POLYPS, HX OF   . DIVERTICULOSIS, COLON   .  GOUT   . HYPERTENSION     off ACEI 2010 because of hyperkalemia in setting of ARI  . Cervical stenosis of spinal canal     fusion 2006  . Familial tremor 03/19/2014  . 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 Aortic stenosis, moderate 03/27/2015    post AVR echo Echo 11/16: Mild LVH, EF 55-60%, normal wall motion, grade 1 diastolic dysfunction, bioprosthetic AVR okay (mean gradient 18 mmHg) with trivial AI, mild LAE, atrial septal aneurysm, small effusion   . Myocardial infarction [facility])     October 20th 2016  . Dysrhythmia     2 bouts of afib post op and at home but corrected with medication.  [name] GERD (gastroesophageal reflux disease)     uses Zantac  . Anxiety     PTSD  . Complication of anesthesia     "takes a lot to put him under and has woken up during surgery" Difficult to wake up . PTSD; "does not metabolize RX well"  . Difficult intubation   . High cholesterol   . Heart murmur   . CHF (congestive heart failure) ([place])     "secondry to OHS"  . OSA on CPAP   . Kidney stones     remote open stone-retrieval on L  . Hypothyroidism     newly diaganosed 09/2015  . Type II diabetes mellitus ([place])     diet controlled (10/28/2015)  . Arthritis     "qwhere" (10/29/2015)    Past Surgical History  Procedure Laterality Date  . Testicle torsion reduction Right 1960  . Toe fusion Left 1985 & 2004    "great toe"  . Anterior fusion cervical spine  2002  . Inguinal hernia repair Left 1962  . Kidney stone surgery  1986    "cut me open"  . Posterior fusion cervical spine  2006  . Aortic valve replacement N/A 03/30/2015    Procedure: AORTIC VALVE REPLACEMENT (AVR);  Surgeon: Grace Isaac, MD;  Location: [place];  Service: Open Heart Surgery;  Laterality: N/A;  . Tee without cardioversion N/A 03/30/2015    Procedure: TRANSESOPHAGEAL ECHOCARDIOGRAM (TEE);  Surgeon: Grace Isaac, MD;  Location: [place];  Service: Open Heart Surgery;  Laterality: N/A;  . Total hip arthroplasty Right 10/27/2015    Procedure: RIGHT TOTAL HIP ARTHROPLASTY ANTERIOR APPROACH and steroid injection right foot;  Surgeon: Mcarthur Rossetti, MD;  Location: [place];  Service: Orthopedics;   Laterality: Right;  . Joint replacement    . Back surgery    . Tonsillectomy    . Hemorrhoid banding  1970s  . Cardiac catheterization N/A 03/26/2015    Procedure: Left Heart Cath and Coronary Angiography;  Surgeon: Leonie Man, MD;  Location: [place] [facility];  Service: Cardiovascular;  Laterality: N/A;  . Coronary artery bypass graft N/A 03/30/2015    Procedure: CORONARY ARTERY BYPASS GRAFTING (CABG) x four, using left internal mammary artery and left    leg greater saphenous vein harvested endoscopically ;  Surgeon: Grace Isaac, MD;  Location: [place];  Service: Open Heart Surgery;  Laterality: N/A;  . Cardiac valve replacement      Social History   Social History  . Marital Status: Significant Other    Spouse Name: N/A  . Number of Children: N/A  . Years of Education: N/A   Occupational History  . Retired Animal nutritionist  Social History Main Topics  . Smoking status: Former Smoker -- 1.00 packs/day for 34 years    Types: Cigarettes    Quit date: 06/06/1990  . Smokeless tobacco: Never Used     Comment: widowed 1991 - lives with SO [name] who is Therapist, sports  . Alcohol Use: 0.6 oz/week    1 Glasses of wine per week  . Drug Use: No  . Sexual Activity: Yes   Other Topics Concern  . Not on file   Social History Narrative    Family Status  Relation Status Death Age  . Mother Deceased     Heart Disease, tremor  . Father Deceased     Heart Disease  . Sister Deceased     tremor  . Brother      unknown  . Brother Alive     healthy  . Son Alive     healthy    Review of Systems A complete 10 system ROS was obtained and was negative apart from what is mentioned.   Objective:   VITALS:   Filed Vitals:   12/18/15 0837  BP: 148/78  Pulse: 65  Height: _0  (1.727 m)  Weight: 207 lb (93.895 kg)   Wt Readings from Last 3 Encounters:  12/18/15 207 lb (93.895 kg)  10/27/15 215 lb (97.523 kg)  10/21/15 215 lb 14.4 oz (97.932 kg)    Gen:  Appears  stated age and in NAD. HEENT:  Normocephalic, atraumatic. The mucous membranes are moist. The superficial temporal arteries are without ropiness or tenderness.  No tongue fasciculations. Cardiovascular: RRR Lungs: Clear to auscultation bilaterally. Neck: There are no carotid bruits noted bilaterally.  NEUROLOGICAL:  Orientation:  The patient is alert and oriented x 3.  Cranial nerves: There is good facial symmetry. Soft palate rises symmetrically and there is no tongue deviation. Hearing is intact to conversational tone. Tone: Tone is good throughout. Sensation: Sensation is intact to light touch throughout. Coordination:  The patient has no dysdiadichokinesia or dysmetria. Motor: Strength is 5/5 in the bilateral upper and lower extremities.  Shoulder shrug is equal bilaterally.  There is no pronator drift.   Gait and Station: The patient is able to ambulate without difficulty.   MOVEMENT EXAM: Tremor:  There is tremor in the UE, noted most significantly with action.  It doesn't change when he holds something of weight.  It is the same proximally and distally.   There is no significant head tremor today, which is improved.   Labs:    Chemistry      Component Value Date/Time   NA 136 10/28/2015 0315   K 4.1 10/28/2015 0315   CL 99* 10/28/2015 0315   CO2 27 10/28/2015 0315   BUN 12 10/28/2015 0315   CREATININE 0.94 10/28/2015 0315      Component Value Date/Time   CALCIUM 9.3 10/28/2015 0315   ALKPHOS 52 10/21/2015 1238   AST 19 10/21/2015 1238   ALT 20 10/21/2015 1238   BILITOT 1.8* 10/21/2015 1238     Lab Results  Component Value Date   HGBA1C 6.2* 10/21/2015   Lab Results  Component Value Date   WBC 11.8* 10/28/2015   HGB 12.3* 10/28/2015   HCT 38.9* 10/28/2015   MCV 90.3 10/28/2015   PLT 141* 10/28/2015   Lab Results  Component Value Date   TSH 6.00* 09/22/2015        Assessment/Plan:   1.  Essential Tremor.  -This is evidenced by the symmetrical nature  and longstanding hx of gradually getting worse and family hx.  -continue primidone to 100 mg in AM, continue 50 mg at night.    Doesn't want to increase it and doesn't want to change the dosage.  Previously contacted pharmacy previously due to his hx of cytochrome P450 problems but told that no interaction and not an inducer of this system.  Had increased tremor previously with amiodarone.    Risks, benefits, side effects and alternative therapies were discussed.  The opportunity to ask questions was given and they were answered to the best of my ability.  The patient expressed understanding and willingness to follow the outlined treatment protocols.  -Not a candidate for topamax due to current nephrolithiasis and hx of same.  Not able to increase beta blocker due to h/o bradycardia.  May be able to push up neurontin but would need to push up to much higher dose.  2.  Fasciculations  -repeat EMG in 12/206 much improved without evidence of MND  -The patient does have a compressive lesion in the cervical spine, but I'm not sure that that explains all of his symptoms.   3.  Episodes of tremulousness, accompanied by potential speech change and ataxia according to his wife  -Pt can r/s 48 hour EEG at his convenience.  Routine EEG was negative and no further spells. 4.  Increased balance loss after CABG  -didn't do cardiac rehab.  Talked to him about doing it and balance therapy may help as well.  He is not opposed and is going to think about it. 5.  Follow up is anticipated in the next few months, sooner should new neurologic issues arise.  Much greater than 50% of this visit was spent in counseling with the patient and the family.  Total face to face time:  30 min

## 2016-01-04 NOTE — Progress Notes (Signed)
01/05/2016 Troy Banks   04-17-1941  [id number]  Primary Physician Cathlean Cower, MD Primary Cardiologist: Dr Zinnia Tindall [name]  HPI:  75 y.o. Male seen for follow up CAD and Afib. He was admitted 10/19-10/31/16 with a non-STEMI. LHC demonstrated 3 vessel CAD. He underwent CABG with LIMA-LAD, SVG-intermediate, SVG-LCx, SVG-distal RCA and bioprosthetic AVR. Atenolol was initiated but later stopped secondary to bradycardia. He did develop atrial fibrillation. He was placed on amiodarone with return to NSR. He has maintained NSR and has recovered well. He has an essential tremor and is on Primidone chronically. 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 Amiodarone was stopped in January 2017 at the time he was hospitalized for an acute gout attack in his [place]. He has maintained NSR.  She is retired Quarry manager. He has a daughter who is a family physician in [place]. On follow up today he reports he is under a lot of stress. They are remodeling their home with plans to sell. He is keeping up with exercise and in losing about a lb a week. Reports BP 130-140 at home. Notes some sternal incision sensitivity. Wants to reduce Crestor. Concerned about side effects.    Current Outpatient Prescriptions  Medication Sig Dispense Refill  . allopurinol (ZYLOPRIM) 100 MG tablet Take 1 tablet (100 mg total) by mouth 2 (two) times daily. 180 tablet 3  . amLODipine (NORVASC) 10 MG tablet Take 1 tablet (10 mg total) by mouth daily. 90 tablet 1  . Ascorbic Acid (VITAMIN C) 1000 MG tablet Take 1,000 mg by mouth daily.    [name] aspirin 81 MG tablet Take 81 mg by mouth daily.    . blood glucose meter kit and supplies KIT Use monitor to check blood sugar twice a day. DX E11.09 1 each 0  . Blood Glucose Monitoring Suppl (ONE TOUCH ULTRA 2) W/DEVICE KIT Test up to two times daily 1 each 1  . Cholecalciferol (VITAMIN D3) 2000 UNITS TABS Take 2,000 Units by  mouth daily.    . clopidogrel (PLAVIX) 75 MG tablet Take 1 tablet (75 mg total) by mouth daily. 30 tablet 11  . cyanocobalamin 2000 MCG tablet Take 2,000 mcg by mouth daily.    . furosemide (LASIX) 40 MG tablet TAKE 1 TABLET BY MOUTH EVERY OTHER DAY 30 tablet 6  . glucose blood (ONE TOUCH TEST STRIPS) test strip Test up to two times daily 100 each 12  . ibuprofen (ADVIL,MOTRIN) 200 MG tablet Take 600 mg by mouth every 6 (six) hours as needed for moderate pain.    [name] levothyroxine (LEVOTHROID) 25 MCG tablet Take 1 tablet (25 mcg total) by mouth daily before breakfast. 90 tablet 3  . Misc Natural Products (CURCUMAX PRO) TABS Take 1 tablet by mouth 2 (two) times daily.    . ONE TOUCH LANCETS MISC Test up to two times daily 100 each 11  . OVER THE COUNTER MEDICATION Take 1 capsule by mouth 2 (two) times daily. Tumeric 578m    . primidone (MYSOLINE) 50 MG tablet TAKE 2 TABLETS BY MOUTH EVERY MORNING AND TAKE 1 TABLET IN THE EVENING 270 tablet 1  . ranitidine (ZANTAC) 150 MG tablet Take 1 tablet (150 mg total) by mouth 2 (two) times daily. 180 tablet 3  . rosuvastatin (CRESTOR) 10 MG tablet TAKE 1 TABLET BY MOUTH EVERY DAY 30 tablet 6   No current facility-administered medications for this visit.  Allergies  Allergen Reactions  . Benzodiazepines Other (See Comments)    Patient states he gets stroke symptoms with benzo's.   . Coreg [Carvedilol] Other (See Comments)    Patient poor metabolizer of CYP2D6 - Coreg undergoes extensive hepatic (including 2D6)  . Diazepam Other (See Comments)    Stroke sx  . Lisinopril Other (See Comments)    Pt states C450, and hyperkalemia  . Mobic [Meloxicam] Other (See Comments)    Unspecified  . Sertraline Hcl Other (See Comments)    Pt states all meds that are broken down with C-450- if so have sxs of stroke  . Tramadol Other (See Comments)    Unable to take C450  . Atorvastatin Other (See Comments)    MYALGIAS  . Compazine [Prochlorperazine Maleate]  Other (See Comments)    G665  . Ondansetron Other (See Comments)    C450  . Promethazine Other (See Comments)    C450  . Acyclovir And Related Other (See Comments)    Social History   Social History  . Marital status: Significant Other    Spouse name: N/A  . Number of children: N/A  . Years of education: N/A   Occupational History  . Retired Animal nutritionist    Social History Main Topics  . Smoking status: Former Smoker    Packs/day: 1.00    Years: 34.00    Types: Cigarettes    Quit date: 06/06/1990  . Smokeless tobacco: Never Used     Comment: widowed 1991 - lives with SO [name] who is Therapist, sports  . Alcohol use 0.6 oz/week    1 Glasses of wine per week  . Drug use: No  . Sexual activity: Yes   Other Topics Concern  . Not on file   Social History Narrative  . No narrative on file     Review of Systems: As noted in HPI All other systems reviewed and are otherwise negative except as noted above.    Blood pressure (!) 190/86, pulse 65, height 5' 8" (1.727 m), weight 206 lb 3.2 oz (93.5 kg).  GENERAL:  Well appearing HEENT:  PERRL, EOMI, sclera are clear. Oropharynx is clear. NECK:  No jugular venous distention, carotid upstroke brisk and symmetric, no bruits, no thyromegaly or adenopathy LUNGS:  Clear to auscultation bilaterally CHEST:  Unremarkable HEART:  RRR,  PMI not displaced or sustained,S1 and S2 within normal limits, no S3, no S4: no clicks, no rubs, no murmurs. Sternal incision healed well with some keloid. ABD:  Soft, nontender. BS +, no masses or bruits. No hepatomegaly, no splenomegaly EXT:  2 + pulses throughout, no edema, no cyanosis no clubbing SKIN:  Warm and dry.  No rashes NEURO:  Alert and oriented x 3. Cranial nerves II through XII intact. PSYCH:  Cognitively intact  Lab Results  Component Value Date   WBC 11.8 (H) 10/28/2015   HGB 12.3 (L) 10/28/2015   HCT 38.9 (L) 10/28/2015   PLT 141 (L) 10/28/2015   GLUCOSE 122 (H) 10/28/2015   CHOL  105 09/22/2015   TRIG 104.0 09/22/2015   HDL 42.50 09/22/2015   LDLDIRECT 100.0 09/08/2014   LDLCALC 41 09/22/2015   ALT 20 10/21/2015   AST 19 10/21/2015   NA 136 10/28/2015   K 4.1 10/28/2015   CL 99 (L) 10/28/2015   CREATININE 0.94 10/28/2015   BUN 12 10/28/2015   CO2 27 10/28/2015   TSH 6.00 (H) 09/22/2015   PSA 1.27 09/22/2015   INR 1.47  03/30/2015   HGBA1C 6.2 (H) 10/21/2015   MICROALBUR 2.7 (H) 09/22/2015     EKG- today:  NSR, first degree AV block. NSST changes, QTc 451. I have personally reviewed and interpreted this study.   ASSESSMENT AND PLAN:  1. CAD s/p CABG in October 2016 following NSTEMI. Clinically doing well. He has made favorable lifestyle changes. Will stop Plavix in October. Not a candidate for beta blocker due to brady. Continue statin. 2. S/p bioprosthetic AVR. Reminded him of routine SBE prophylaxis. 3. Afib. Resolved. 4. Hyperlipidemia. Will reduce crestor to 5 mg daily. Repeat labs in 2 months. 5. HTN. Elevated today but has been controlled at home. Will monitor and let me know if it stays high. 6. Gout. On allopurinol.  Evangelia Whitaker [name] MD,FACC 01/05/2016 8:09 AM

## 2016-01-05 ENCOUNTER — Encounter: Payer: Self-pay | Admitting: Cardiology

## 2016-01-05 ENCOUNTER — Ambulatory Visit (INDEPENDENT_AMBULATORY_CARE_PROVIDER_SITE_OTHER): Payer: Medicare Other | Admitting: Cardiology

## 2016-01-05 VITALS — BP 190/86 | HR 65 | Ht 68.0 in | Wt 206.2 lb

## 2016-01-05 DIAGNOSIS — I10 Essential (primary) hypertension: Secondary | ICD-10-CM | POA: Diagnosis not present

## 2016-01-05 DIAGNOSIS — I2510 Atherosclerotic heart disease of native coronary artery without angina pectoris: Secondary | ICD-10-CM

## 2016-01-05 DIAGNOSIS — Z954 Presence of other heart-valve replacement: Secondary | ICD-10-CM | POA: Diagnosis not present

## 2016-01-05 DIAGNOSIS — Z952 Presence of prosthetic heart valve: Secondary | ICD-10-CM

## 2016-01-05 DIAGNOSIS — I252 Old myocardial infarction: Secondary | ICD-10-CM | POA: Diagnosis not present

## 2016-01-05 MED ORDER — ROSUVASTATIN CALCIUM 10 MG PO TABS: 5.0000 mg | ORAL_TABLET | Freq: Every day | ORAL | 6 refills | Status: DC

## 2016-01-05 NOTE — Patient Instructions (Signed)
Continue your efforts at weight loss and exercise  Watch your BP at home. If it stays elevated let me know  Reduce crestor to 5 mg daily. We will repeat lab work in 2 months.  You can stop taking plavix in October  I will see you in 6 months.

## 2016-01-26 ENCOUNTER — Other Ambulatory Visit: Payer: Self-pay | Admitting: Internal Medicine

## 2016-03-04 DIAGNOSIS — I2510 Atherosclerotic heart disease of native coronary artery without angina pectoris: Secondary | ICD-10-CM | POA: Diagnosis not present

## 2016-03-04 DIAGNOSIS — Z954 Presence of other heart-valve replacement: Secondary | ICD-10-CM | POA: Diagnosis not present

## 2016-03-04 DIAGNOSIS — I10 Essential (primary) hypertension: Secondary | ICD-10-CM | POA: Diagnosis not present

## 2016-03-04 DIAGNOSIS — I252 Old myocardial infarction: Secondary | ICD-10-CM | POA: Diagnosis not present

## 2016-03-04 LAB — LIPID PANEL
CHOL/HDL RATIO: 2.8 ratio (ref <=5.0)
CHOLESTEROL: 116 mg/dL — AB (ref 125–200)
HDL: 41 mg/dL (ref >=40)
LDL Cholesterol: 59 mg/dL (ref <130)
Triglycerides: 79 mg/dL (ref <150)
VLDL: 16 mg/dL (ref <30)

## 2016-03-04 LAB — BASIC METABOLIC PANEL
BUN: 17 mg/dL (ref 7–25)
CHLORIDE: 102 mmol/L (ref 98–110)
CO2: 28 mmol/L (ref 20–31)
Calcium: 9.7 mg/dL (ref 8.6–10.3)
Creat: 0.94 mg/dL (ref 0.70–1.18)
GLUCOSE: 111 mg/dL — AB (ref 65–99)
POTASSIUM: 4.8 mmol/L (ref 3.5–5.3)
SODIUM: 142 mmol/L (ref 135–146)

## 2016-03-04 LAB — HEPATIC FUNCTION PANEL
ALT: 16 U/L (ref 9–46)
AST: 16 U/L (ref 10–35)
Albumin: 4.3 g/dL (ref 3.6–5.1)
Alkaline Phosphatase: 54 U/L (ref 40–115)
BILIRUBIN DIRECT: 0.4 mg/dL — AB (ref <=0.2)
BILIRUBIN INDIRECT: 1.5 mg/dL — AB (ref 0.2–1.2)
BILIRUBIN TOTAL: 1.9 mg/dL — AB (ref 0.2–1.2)
Total Protein: 6.7 g/dL (ref 6.1–8.1)

## 2016-03-07 ENCOUNTER — Telehealth: Payer: Self-pay | Admitting: Cardiology

## 2016-03-07 NOTE — Telephone Encounter (Signed)
Left message to call back  

## 2016-03-07 NOTE — Telephone Encounter (Signed)
New message    Pt returning nurse call to get test results.

## 2016-03-07 NOTE — Telephone Encounter (Signed)
-----   Message from [name], MD sent at 03/04/2016  5:05 PM EDT ----- Chemistries are oK. Lipids look excellent.  [name] MD, [facility]

## 2016-03-08 NOTE — Telephone Encounter (Signed)
Returned call to patient.Recent lab results given.

## 2016-03-08 NOTE — Telephone Encounter (Signed)
New Message  Pt wife returning RN call. Please call back to discuss

## 2016-03-28 ENCOUNTER — Other Ambulatory Visit: Payer: Self-pay | Admitting: Internal Medicine

## 2016-04-16 NOTE — Progress Notes (Signed)
Subjective:   Troy Banks was seen in consultation in the movement disorder clinic at the request of Cathlean Cower, MD.  The evaluation is for tremor.  The records that were made available to me were reviewed.  Pt is accompanied by his significant other who supplements the hx.    The patient is a 75 y.o. left handed male with a history of tremor.  Pt reports tremor for at least 30 years.  Pt has noted increasing tremor over the years. Tremor is mostly in the right hand, but it is in the left as well and more rarely in the head as well.  Stress brings out the tremor.   He has never been to a neurologist for tremor.   Is currently on small amount of metoprolol - 25 mg - but that cannot be increased due to hx of bradycardia.  There is a family hx of tremor in his mother, maternal GM, maternal sister and maternal cousins.    Affected by caffeine:  No. (only drinks 2 cups coffee per week; drinks green tea) Affected by alcohol:  Used to decrease tremor but doesn't seem to any longer Affected by stress:  Yes.   Affected by fatigue:  Yes.   Spills soup if on spoon:  No. but has trouble with corn/peas Spills glass of liquid if full:  Yes.   Affects ADL's (tying shoes, brushing teeth, etc):  Yes.   (slow to tie shoes)  06/30/14 update:  Last visit, we started the patient on primidone for his tremor.  He cannot take Topamax because of history of nephrolithiasis or beta blockers because of bradycardia.  The patient reports that he is overall doing well.  For the first time in years, he was able to stand up and put on his jeans as he thinks that the medication helped his balance as well as tremor.  He has good and bad days in terms of tremor.  He has no SE in terms of tremor.  He states though that his metoprolol has been cut back since last visit because of bradycardia.    10/08/14 update:  Pt is on primidone 47m bid.  Pt states that initially it seemed helpful but nowl having tremor; had an episode last  week where couldn't shave because of tremor (not unusual to see tremor while shaving but almost unable to shave) and then whole body started shaking.  Lasted all day.  He had more word finding issues that day (has baseline word finding issues).  States that "I am concerned about something beyond familial tremor."  He is worried about PD.  Having cramping in the fingers and sometimes in the toes. Hands/fingers more than the feet.  He notices that his right arm "jerks", but thinks that that is due to "a botched neck surgery."  No significant issues with swallowing.  No diplopia.  10/30/14 update:  The patient is seen today, accompanied by his wife who supplements the history.  He had an MRI of the brain on 10/24/2014 that was unremarkable with exception of mild small vessel disease.  He had an MRI of the cervical spine demonstrating stenosis at several levels, but especially at the C6-C7 level and C3-C4 levels.  There was questionable thyroid enlargement and he was asked to follow up with his primary care physician in that regard.  On 10/28/2014 he underwent an EMG at our office.  This suggested chronic on active changes and intraspinal lesion at the right C6 level and  right S1 levels.  The etiology of this is unknown, but it could be anywhere from compressive etiologies to degeneration of the anterior horn cells.  He does not meet criteria for motor neuron disease at this point in time.  In regards to tremor, he has a few what he calls "episodes" in which his arms or legs will tremor more and may last 45 seconds.  BS was normal.  No known stress sets them off.  He may have a stuttering speech when it happens.    03/17/15 update:  The patient presents for follow-up today.  He is on primidone, 50 mg bid for essential tremor. Tremor has picked up but mostly with stress.  He thinks that we need to increase the primidone.   He is not a candidate for Topamax because of nephrolithiasis and is not a candidate for beta  blockers because of bradycardia.  We have worked him up extensively because of fasciculations.   No falls but "my balance is way off."   He had an MRI of the lumbar spine after we last met.  This was done on June 7.  There was evidence of severe facet arthrosis at the L4-L5 level and L5-S1 levels.  I told him that these findings do not explain all of his findings, including fasciculations.  He had an EEG on June 2 that was unremarkable.  This was done because of episodes of transient alteration of awareness that his wife had described.  His wife states that they didn't do the prolonged EEG because his insurance wouldn't allow it without a significant copay.  They are planning on r/s soon.  07/21/15 update:  The patient is seen today in follow-up, accompanied by his wife who supplements the history.  Much has happened since our last visit and I did review records.  Last visit, I increased his primidone to 100 mg in the morning and 50 mg at night.  He ended up being hospitalized in October, 2016 with an NSTEMI and ultimately required a coronary artery bypass graft and aortic valve replacement on 03/30/2015.  While in the hospital for this, he developed atrial fibrillation and was started on amiodarone.  Not surprisingly, this caused a significant increase in tremor.  He was rehospitalized in January, 2017 for acute gout and while he was in there he mentioned that tremor had increased.  His amiodarone was stopped.  He did have a repeat EMG since our last visit and this looked much better, without evidence of motor neuron disease.  He does feel that his balance has been a bit off.  Did not do cardiac rehab after CABG.    12/18/15 update:  The patient is seen today in follow-up.  He is accompanied by his wife who supplements the history.  I have reviewed prior records made available to me.  He remains on primidone, 100 mg in the morning and 50 mg at night. Been after amiodarone for 7 months and at the beginning of May,  he felt that tremor was much better but after hip surgery, felt that he regressed.  Surgery done under spinal anesthesia.  He had a total hip replacement on 10/27/2015.  Only did a week of therapy after that.  Felt that balance and tremor was worse after surgery.  Been losing weight through exercise.  Was put on synthroid 3 months ago.  04/19/16 update:  Pt f/u today accompanied by his wife who supplements the hx.  On primidone for ET,100 mg in the  AM/50 mg at night.  Has good days and bad days with tremor.  Reviewed Dr. Doug Sou notes.  Still off of amiodarone.  Is planning on selling home in spring.  Planning on moving to [place], [place].  Lost weight with lifestyle changes.  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  Mood much better now that back at work 5 days a week.  Current/Previously tried tremor medications: neurontin - was up to 300 mg bid for rotator cuff issues but never seemed to help tremor; back on it at low dose for hip pain.    Outside reports reviewed: historical medical records and referral letter/letters.  Allergies  Allergen Reactions  . Benzodiazepines Other (See Comments)    Patient states he gets stroke symptoms with benzo's.   . Coreg [Carvedilol] Other (See Comments)    Patient poor metabolizer of CYP2D6 - Coreg undergoes extensive hepatic (including 2D6)  . Diazepam Other (See Comments)    Stroke sx  . Lisinopril Other (See Comments)    Pt states C450, and hyperkalemia  . Mobic [Meloxicam] Other (See Comments)    Unspecified  . Sertraline Hcl Other (See Comments)    Pt states all meds that are broken down with C-450- if so have sxs of stroke  . Tramadol Other (See Comments)    Unable to take C450  . Atorvastatin Other (See Comments)    MYALGIAS  . Compazine [Prochlorperazine Maleate] Other (See Comments)    Y174  . Ondansetron Other (See Comments)    C450  . Promethazine Other (See Comments)    C450  . Acyclovir And Related Other (See  Comments)    Outpatient Encounter Prescriptions as of 04/19/2016  Medication Sig  . allopurinol (ZYLOPRIM) 100 MG tablet TAKE 1 TABLET BY MOUTH 2 TIMES DAILY  . amLODipine (NORVASC) 10 MG tablet TAKE 1 TABLET BY MOUTH EVERY DAY  . Ascorbic Acid (VITAMIN C) 1000 MG tablet Take 1,000 mg by mouth daily.  [name] aspirin 81 MG tablet Take 81 mg by mouth daily.  . blood glucose meter kit and supplies KIT Use monitor to check blood sugar twice a day. DX E11.09  . Blood Glucose Monitoring Suppl (ONE TOUCH ULTRA 2) W/DEVICE KIT Test up to two times daily  . Cholecalciferol (VITAMIN D3) 2000 UNITS TABS Take 2,000 Units by mouth daily.  . cyanocobalamin 2000 MCG tablet Take 2,000 mcg by mouth daily.  . furosemide (LASIX) 40 MG tablet TAKE 1 TABLET BY MOUTH EVERY OTHER DAY  . glucose blood (ONE TOUCH TEST STRIPS) test strip Test up to two times daily  . ibuprofen (ADVIL,MOTRIN) 200 MG tablet Take 600 mg by mouth every 6 (six) hours as needed for moderate pain.  [name] levothyroxine (LEVOTHROID) 25 MCG tablet Take 1 tablet (25 mcg total) by mouth daily before breakfast.  . Misc Natural Products (CURCUMAX PRO) TABS Take 1 tablet by mouth 2 (two) times daily.  . ONE TOUCH LANCETS MISC Test up to two times daily  . OVER THE COUNTER MEDICATION Take 1 capsule by mouth 2 (two) times daily. Tumeric 559m  . primidone (MYSOLINE) 50 MG tablet TAKE 2 TABLETS BY MOUTH EVERY MORNING AND TAKE 1 TABLET IN THE EVENING  . ranitidine (ZANTAC) 150 MG tablet Take 1 tablet (150 mg total) by mouth 2 (two) times daily.  . rosuvastatin (CRESTOR) 10 MG tablet Take 0.5 tablets (5 mg total) by mouth daily.  . [DISCONTINUED] clopidogrel (PLAVIX) 75 MG tablet Take 1 tablet (75  mg total) by mouth daily.   No facility-administered encounter medications on file as of 04/19/2016.     Past Medical History:  Diagnosis Date  . Anxiety    PTSD  . Aortic stenosis, moderate 03/27/2015   post AVR echo Echo 11/16: Mild LVH, EF 55-60%, normal  wall motion, grade 1 diastolic dysfunction, bioprosthetic AVR okay (mean gradient 18 mmHg) with trivial AI, mild LAE, atrial septal aneurysm, small effusion   . Arthritis    "qwhere" (10/29/2015)  . Cervical stenosis of spinal canal    fusion 2006  . CHF (congestive heart failure) ([facility] [place])    "secondry to OHS"  . COLONIC POLYPS, HX OF   . Complication of anesthesia    "takes a lot to put him under and has woken up during surgery" Difficult to wake up . PTSD; "does not metabolize RX well"  . Difficult intubation   . DIVERTICULOSIS, COLON   . Dysrhythmia    2 bouts of afib post op and at home but corrected with medication.  . Familial tremor 03/19/2014  . GERD (gastroesophageal reflux disease)    uses Zantac  . GOUT   . Heart murmur   . High cholesterol   . HYPERTENSION    off ACEI 2010 because of hyperkalemia in setting of ARI  . Hypothyroidism    newly diaganosed 09/2015  . Kidney stones    remote open stone-retrieval on L  . Myocardial infarction    October 20th 2016  . OSA on CPAP   . 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 Type II diabetes mellitus ([place])    diet controlled (10/28/2015)    Past Surgical History:  Procedure Laterality Date  . ANTERIOR FUSION CERVICAL SPINE  2002  . AORTIC VALVE REPLACEMENT N/A 03/30/2015   Procedure: AORTIC VALVE REPLACEMENT (AVR);  Surgeon: Grace Isaac, MD;  Location: [facility];  Service: Open Heart Surgery;  Laterality: N/A;  . BACK SURGERY    . CARDIAC CATHETERIZATION N/A 03/26/2015   Procedure: Left Heart Cath and Coronary Angiography;  Surgeon: Leonie Man, MD;  Location: [facility];  Service: Cardiovascular;  Laterality: N/A;  . CARDIAC VALVE REPLACEMENT    . CORONARY ARTERY BYPASS GRAFT N/A 03/30/2015   Procedure: CORONARY ARTERY BYPASS GRAFTING (CABG) x four, using left internal mammary artery and left    leg greater saphenous vein harvested endoscopically ;   Surgeon: Grace Isaac, MD;  Location: [facility];  Service: Open Heart Surgery;  Laterality: N/A;  . HEMORRHOID BANDING  1970s  . INGUINAL HERNIA REPAIR Left 1962  . JOINT REPLACEMENT    . [facility]   "cut me open"  . POSTERIOR FUSION CERVICAL SPINE  2006  . TEE WITHOUT CARDIOVERSION N/A 03/30/2015   Procedure: TRANSESOPHAGEAL ECHOCARDIOGRAM (TEE);  Surgeon: Grace Isaac, MD;  Location: [facility];  Service: Open Heart Surgery;  Laterality: N/A;  . TESTICLE TORSION REDUCTION Right 1960  . TOE FUSION Left 1985 & 2004   "great toe"  . TONSILLECTOMY    . TOTAL HIP ARTHROPLASTY Right 10/27/2015   Procedure: RIGHT TOTAL HIP ARTHROPLASTY ANTERIOR APPROACH and steroid injection right foot;  Surgeon: Mcarthur Rossetti, MD;  Location: [facility];  Service: Orthopedics;  Laterality: Right;    Social History   Social History  . Marital status: Significant Other    Spouse name: N/A  . Number of children: N/A  .  Years of education: N/A   Occupational History  . Retired Animal nutritionist    Social History Main Topics  . Smoking status: Former Smoker    Packs/day: 1.00    Years: 34.00    Types: Cigarettes    Quit date: 06/06/1990  . Smokeless tobacco: Never Used     Comment: widowed 1991 - lives with SO [name] who is Therapist, sports  . Alcohol use 0.6 oz/week    1 Glasses of wine per week  . Drug use: No  . Sexual activity: Yes   Other Topics Concern  . Not on file   Social History Narrative  . No narrative on file    Family Status  Relation Status  . Mother Deceased   Heart Disease, tremor  . Father Deceased   Heart Disease  . Sister Deceased   tremor  . Brother    unknown  . Brother Alive   healthy  . Son Alive   healthy    Review of Systems A complete 10 system ROS was obtained and was negative apart from what is mentioned.   Objective:   VITALS:   Vitals:   04/19/16 0844  BP: (!) 148/80  Pulse: 60  Weight: 199 lb (90.3 kg)  Height: 5' 8"  (1.727 m)     Wt Readings from Last 3 Encounters:  04/19/16 199 lb (90.3 kg)  01/05/16 206 lb 3.2 oz (93.5 kg)  12/18/15 207 lb (93.9 kg)    Gen:  Appears stated age and in NAD. HEENT:  Normocephalic, atraumatic. The mucous membranes are moist. The superficial temporal arteries are without ropiness or tenderness.  No tongue fasciculations. Cardiovascular: RRR Lungs: Clear to auscultation bilaterally. Neck: There is L carotid bruit  NEUROLOGICAL:  Orientation:  The patient is alert and oriented x 3.  Cranial nerves: There is good facial symmetry. Soft palate rises symmetrically and there is no tongue deviation. Hearing is intact to conversational tone. Tone: Tone is good throughout. Sensation: Sensation is intact to light touch throughout. Coordination:  The patient has no dysdiadichokinesia or dysmetria. Motor: Strength is 5/5 in the bilateral upper and lower extremities.  Shoulder shrug is equal bilaterally.  There is no pronator drift.   Gait and Station: The patient is able to ambulate without difficulty.   MOVEMENT EXAM: Tremor:  There is tremor in the UE, noted most significantly with action.  It doesn't change when he holds something of weight.  It is the same proximally and distally.   There is no significant head tremor today, which is improved.   Labs:    Chemistry      Component Value Date/Time   NA 142 03/04/2016 0918   K 4.8 03/04/2016 0918   CL 102 03/04/2016 0918   CO2 28 03/04/2016 0918   BUN 17 03/04/2016 0918   CREATININE 0.94 03/04/2016 0918      Component Value Date/Time   CALCIUM 9.7 03/04/2016 0918   ALKPHOS 54 03/04/2016 0918   AST 16 03/04/2016 0918   ALT 16 03/04/2016 0918   BILITOT 1.9 (H) 03/04/2016 0918     Lab Results  Component Value Date   HGBA1C 6.2 (H) 10/21/2015   Lab Results  Component Value Date   WBC 11.8 (H) 10/28/2015   HGB 12.3 (L) 10/28/2015   HCT 38.9 (L) 10/28/2015   MCV 90.3 10/28/2015   PLT 141 (L) 10/28/2015   Lab Results   Component Value Date   TSH 6.00 (H) 09/22/2015  Assessment/Plan:   1.  Essential Tremor.  -This is evidenced by the symmetrical nature and longstanding hx of gradually getting worse and family hx.  -continue primidone to 100 mg in AM, continue 50 mg at night.    Doesn't want to increase it and doesn't want to change the dosage.  Previously contacted pharmacy previously due to his hx of cytochrome P450 problems but told that no interaction and not an inducer of this system.  Had increased tremor previously with amiodarone.    Risks, benefits, side effects and alternative therapies were discussed.  The opportunity to ask questions was given and they were answered to the best of my ability.  The patient expressed understanding and willingness to follow the outlined treatment protocols.  -Not a candidate for topamax due to current nephrolithiasis and hx of same.  Not able to increase beta blocker due to h/o bradycardia.  May be able to push up neurontin but would need to push up to much higher dose.  2.  Fasciculations  -repeat EMG in 05/2015 much improved without evidence of MND  -The patient does have a compressive lesion in the cervical spine, but I'm not sure that that explains all of his symptoms.   3.  L carotid bruit  -having carotid u/s today at Dr. Doug Sou office 4.  Episodes of tremulousness, accompanied by potential speech change and ataxia according to his wife  - Routine EEG was negative and no further spells. 5.  Follow up is anticipated in the next few months, sooner should new neurologic issues arise.  Much greater than 50% of this visit was spent in counseling with the patient and the family.  Total face to face time:  25 min

## 2016-04-19 ENCOUNTER — Encounter: Payer: Self-pay | Admitting: Neurology

## 2016-04-19 ENCOUNTER — Ambulatory Visit (INDEPENDENT_AMBULATORY_CARE_PROVIDER_SITE_OTHER): Payer: Medicare Other | Admitting: Neurology

## 2016-04-19 ENCOUNTER — Ambulatory Visit (HOSPITAL_COMMUNITY)
Admission: RE | Admit: 2016-04-19 | Discharge: 2016-04-19 | Disposition: A | Discharge status: Home / Self Care | Payer: Medicare Other | Source: Ambulatory Visit | Attending: Cardiovascular Disease | Admitting: Cardiovascular Disease

## 2016-04-19 ENCOUNTER — Telehealth: Payer: Self-pay | Admitting: *Deleted

## 2016-04-19 VITALS — BP 148/80 | HR 60 | Ht 68.0 in | Wt 199.0 lb

## 2016-04-19 DIAGNOSIS — R0989 Other specified symptoms and signs involving the circulatory and respiratory systems: Secondary | ICD-10-CM

## 2016-04-19 DIAGNOSIS — I6523 Occlusion and stenosis of bilateral carotid arteries: Secondary | ICD-10-CM | POA: Diagnosis not present

## 2016-04-19 DIAGNOSIS — G250 Essential tremor: Secondary | ICD-10-CM | POA: Diagnosis not present

## 2016-04-19 DIAGNOSIS — I2510 Atherosclerotic heart disease of native coronary artery without angina pectoris: Secondary | ICD-10-CM | POA: Diagnosis not present

## 2016-04-19 NOTE — Telephone Encounter (Signed)
DPR for wife who has been notified of carotid results and findings by phone with verbal understanding.

## 2016-04-23 ENCOUNTER — Other Ambulatory Visit: Payer: Self-pay | Admitting: Internal Medicine

## 2016-05-13 ENCOUNTER — Other Ambulatory Visit: Payer: Self-pay | Admitting: Internal Medicine

## 2016-05-14 ENCOUNTER — Emergency Department (HOSPITAL_COMMUNITY): Payer: Worker's Compensation

## 2016-05-14 ENCOUNTER — Emergency Department (HOSPITAL_COMMUNITY)
Admission: EM | Admit: 2016-05-14 | Discharge: 2016-05-14 | Disposition: A | Discharge status: Home / Self Care | Payer: Worker's Compensation | Attending: Emergency Medicine | Admitting: Emergency Medicine

## 2016-05-14 ENCOUNTER — Encounter (HOSPITAL_COMMUNITY): Payer: Self-pay

## 2016-05-14 DIAGNOSIS — Z79899 Other long term (current) drug therapy: Secondary | ICD-10-CM | POA: Insufficient documentation

## 2016-05-14 DIAGNOSIS — Z87891 Personal history of nicotine dependence: Secondary | ICD-10-CM | POA: Insufficient documentation

## 2016-05-14 DIAGNOSIS — S4991XA Unspecified injury of right shoulder and upper arm, initial encounter: Secondary | ICD-10-CM | POA: Diagnosis present

## 2016-05-14 DIAGNOSIS — E119 Type 2 diabetes mellitus without complications: Secondary | ICD-10-CM | POA: Insufficient documentation

## 2016-05-14 DIAGNOSIS — W19XXXA Unspecified fall, initial encounter: Secondary | ICD-10-CM

## 2016-05-14 DIAGNOSIS — Z96641 Presence of right artificial hip joint: Secondary | ICD-10-CM | POA: Diagnosis not present

## 2016-05-14 DIAGNOSIS — S2020XA Contusion of thorax, unspecified, initial encounter: Secondary | ICD-10-CM

## 2016-05-14 DIAGNOSIS — M545 Low back pain: Secondary | ICD-10-CM | POA: Diagnosis not present

## 2016-05-14 DIAGNOSIS — Y929 Unspecified place or not applicable: Secondary | ICD-10-CM | POA: Diagnosis not present

## 2016-05-14 DIAGNOSIS — S40011A Contusion of right shoulder, initial encounter: Secondary | ICD-10-CM | POA: Diagnosis not present

## 2016-05-14 DIAGNOSIS — Y990 Civilian activity done for income or pay: Secondary | ICD-10-CM | POA: Insufficient documentation

## 2016-05-14 DIAGNOSIS — W1839XA Other fall on same level, initial encounter: Secondary | ICD-10-CM | POA: Diagnosis not present

## 2016-05-14 DIAGNOSIS — S5001XA Contusion of right elbow, initial encounter: Secondary | ICD-10-CM | POA: Diagnosis not present

## 2016-05-14 DIAGNOSIS — Z7982 Long term (current) use of aspirin: Secondary | ICD-10-CM | POA: Diagnosis not present

## 2016-05-14 DIAGNOSIS — E039 Hypothyroidism, unspecified: Secondary | ICD-10-CM | POA: Diagnosis not present

## 2016-05-14 DIAGNOSIS — I2510 Atherosclerotic heart disease of native coronary artery without angina pectoris: Secondary | ICD-10-CM | POA: Insufficient documentation

## 2016-05-14 DIAGNOSIS — I110 Hypertensive heart disease with heart failure: Secondary | ICD-10-CM | POA: Insufficient documentation

## 2016-05-14 DIAGNOSIS — Y939 Activity, unspecified: Secondary | ICD-10-CM | POA: Diagnosis not present

## 2016-05-14 DIAGNOSIS — I252 Old myocardial infarction: Secondary | ICD-10-CM | POA: Diagnosis not present

## 2016-05-14 DIAGNOSIS — I5032 Chronic diastolic (congestive) heart failure: Secondary | ICD-10-CM | POA: Insufficient documentation

## 2016-05-14 DIAGNOSIS — Z951 Presence of aortocoronary bypass graft: Secondary | ICD-10-CM | POA: Diagnosis not present

## 2016-05-14 MED ORDER — IBUPROFEN 800 MG PO TABS: 800.0000 mg | ORAL_TABLET | Freq: Four times a day (QID) | ORAL | 0 refills | Status: AC

## 2016-05-14 NOTE — ED Notes (Signed)
Patient transported to X-ray 

## 2016-05-14 NOTE — ED Provider Notes (Signed)
[facility] DEPT Provider Note   CSN: [id number] Arrival date & time: 05/14/16  1116     History   Chief Complaint Chief Complaint  Patient presents with  . Fall    HPI Troy Banks is a 75 y.o. male.  The history is provided by the patient.  Fall  This is a new problem. The current episode started 12 to 24 hours ago. The problem occurs constantly. The problem has not changed since onset.Associated symptoms comments: Pain in left low back and right shoulder. Exacerbated by: deep breathing. Nothing relieves the symptoms. He has tried nothing for the symptoms.    Past Medical History:  Diagnosis Date  . Anxiety    PTSD  . Aortic stenosis, moderate 03/27/2015   post AVR echo Echo 11/16: Mild LVH, EF 55-60%, normal wall motion, grade 1 diastolic dysfunction, bioprosthetic AVR okay (mean gradient 18 mmHg) with trivial AI, mild LAE, atrial septal aneurysm, small effusion   . Arthritis    "qwhere" (10/29/2015)  . Carotid artery stenosis    Carotid US 11/17: 1-39% bilateral ICA stenosis. F/u prn  . Cervical stenosis of spinal canal    fusion 2006  . CHF (congestive heart failure) ([place])    "secondry to OHS"  . COLONIC POLYPS, HX OF   . Complication of anesthesia    "takes a lot to put him under and has woken up during surgery" Difficult to wake up . PTSD; "does not metabolize RX well"  . Difficult intubation   . DIVERTICULOSIS, COLON   . Dysrhythmia    2 bouts of afib post op and at home but corrected with medication.  . Familial tremor 03/19/2014  . GERD (gastroesophageal reflux disease)    uses Zantac  . GOUT   . Heart murmur   . High cholesterol   . HYPERTENSION    off ACEI 2010 because of hyperkalemia in setting of ARI  . Hypothyroidism    newly diaganosed 09/2015  . Kidney stones    remote open stone-retrieval on L  . Myocardial infarction    October 20th 2016  . OSA on CPAP   . 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 Type II diabetes mellitus ([place])    diet controlled (10/28/2015)    Patient Active Problem List   Diagnosis Date Noted  . Osteoarthritis of right hip 10/27/2015  . Status post total replacement of right hip 10/27/2015  . Chronic chest pain 09/22/2015  . Acute gouty arthritis 06/11/2015  . CAD (coronary artery disease), native coronary artery 06/11/2015  . Chronic diastolic CHF (congestive heart failure) ([place]) 06/11/2015  . H/O tissue AVR Oct 2016 06/10/2015  . PAF post CABG/AVR- Amiodarone 06/10/2015  . Dyslipidemia 06/10/2015  . S/P CABG x 4-Oct 2016 03/30/2015  . H/O NSTEMI-Oct 2016 03/27/2015  . 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 Depression 09/08/2014  . GERD (gastroesophageal reflux disease)   . Essential tremor 03/31/2014  . Iliotibial band syndrome affecting left lower leg 01/01/2014  . Spondylolisthesis at L4-L5 level 01/01/2014  . Sinus bradycardia 12/31/2013  . Greater trochanteric bursitis of right hip 12/04/2013  . OSA on CPAP   . Diet-controlled diabetes mellitus ([place]) 07/08/2009  . COLONIC POLYPS, HX OF 12/31/2008  . GOUT 12/22/2008  . Essential hypertension 12/22/2008  . DIVERTICULOSIS, COLON 12/22/2008  . NEPHROLITHIASIS, HX OF 12/22/2008    Past Surgical History:  Procedure Laterality Date  .  ANTERIOR FUSION CERVICAL SPINE  2002  . AORTIC VALVE REPLACEMENT N/A 03/30/2015   Procedure: AORTIC VALVE REPLACEMENT (AVR);  Surgeon: Grace Isaac, MD;  Location: [facility];  Service: Open Heart Surgery;  Laterality: N/A;  . BACK SURGERY    . CARDIAC CATHETERIZATION N/A 03/26/2015   Procedure: Left Heart Cath and Coronary Angiography;  Surgeon: Leonie Man, MD;  Location: [facility];  Service: Cardiovascular;  Laterality: N/A;  . CARDIAC VALVE REPLACEMENT    . CORONARY ARTERY BYPASS GRAFT N/A 03/30/2015   Procedure: CORONARY ARTERY BYPASS GRAFTING (CABG) x four, using left internal mammary artery  and left    leg greater saphenous vein harvested endoscopically ;  Surgeon: Grace Isaac, MD;  Location: [facility];  Service: Open Heart Surgery;  Laterality: N/A;  . HEMORRHOID BANDING  1970s  . INGUINAL HERNIA REPAIR Left 1962  . JOINT REPLACEMENT    . [place]   "cut me open"  . POSTERIOR FUSION CERVICAL SPINE  2006  . TEE WITHOUT CARDIOVERSION N/A 03/30/2015   Procedure: TRANSESOPHAGEAL ECHOCARDIOGRAM (TEE);  Surgeon: Grace Isaac, MD;  Location: [facility];  Service: Open Heart Surgery;  Laterality: N/A;  . TESTICLE TORSION REDUCTION Right 1960  . TOE FUSION Left 1985 & 2004   "great toe"  . TONSILLECTOMY    . TOTAL HIP ARTHROPLASTY Right 10/27/2015   Procedure: RIGHT TOTAL HIP ARTHROPLASTY ANTERIOR APPROACH and steroid injection right foot;  Surgeon: Mcarthur Rossetti, MD;  Location: [facility];  Service: Orthopedics;  Laterality: Right;       Home Medications    Prior to Admission medications   Medication Sig Start Date End Date Taking? Authorizing Provider  allopurinol (ZYLOPRIM) 100 MG tablet TAKE 1 TABLET BY MOUTH 2 TIMES DAILY 04/26/16   [name], MD  amLODipine (NORVASC) 10 MG tablet TAKE 1 TABLET BY MOUTH EVERY DAY 03/28/16   [name], MD  Ascorbic Acid (VITAMIN C) 1000 MG tablet Take 1,000 mg by mouth daily.    Historical Provider, MD  aspirin 81 MG tablet Take 81 mg by mouth daily.    Historical Provider, MD  blood glucose meter kit and supplies KIT Use monitor to check blood sugar twice a day. DX E11.09 09/29/14   Rowe Clack, MD  Blood Glucose Monitoring Suppl (ONE TOUCH ULTRA 2) W/DEVICE KIT Test up to two times daily 09/29/14   Rowe Clack, MD  Cholecalciferol (VITAMIN D3) 2000 UNITS TABS Take 2,000 Units by mouth daily.    Historical Provider, MD  cyanocobalamin 2000 MCG tablet Take 2,000 mcg by mouth daily.    Historical Provider, MD  furosemide (LASIX) 40 MG tablet TAKE 1 TABLET BY MOUTH EVERY OTHER DAY 07/27/15   [name]  [name], MD  glucose blood (ONE TOUCH TEST STRIPS) test strip Test up to two times daily 09/29/14   Rowe Clack, MD  ibuprofen (ADVIL,MOTRIN) 200 MG tablet Take 600 mg by mouth every 6 (six) hours as needed for moderate pain.    Historical Provider, MD  levothyroxine (LEVOTHROID) 25 MCG tablet Take 1 tablet (25 mcg total) by mouth daily before breakfast. 09/23/15   [name], MD  Misc Natural Products (CURCUMAX PRO) TABS Take 1 tablet by mouth 2 (two) times daily.    Historical Provider, MD  ONE TOUCH LANCETS MISC Test up to two times daily 09/29/14   Rowe Clack, MD  OVER THE COUNTER MEDICATION Take 1  capsule by mouth 2 (two) times daily. Tumeric 554m    Historical Provider, MD  primidone (MYSOLINE) 50 MG tablet TAKE 2 TABLETS BY MOUTH EVERY MORNING AND TAKE 1 TABLET IN THE EVENING 11/16/15   REustace QuailTat, DO  ranitidine (ZANTAC) 150 MG tablet TAKE 1 TABLET BY MOUTH 2 TIMES DAILY 05/13/16   JBiagio Borg MD  rosuvastatin (CRESTOR) 10 MG tablet Take 0.5 tablets (5 mg total) by mouth daily. 01/05/16   [name] JMartinique MD    Family History Family History  Problem Relation Age of Onset  . Heart disease Mother   . Hypertension Mother   . Diabetes Mother   . Heart disease Father   . Hypertension Father   . Prostate cancer Maternal Uncle   . Hypertension Maternal Grandmother   . Heart disease Maternal Grandmother   . Hypertension Maternal Grandfather   . Heart disease Maternal Grandfather   . Hypertension Paternal Grandfather   . Heart disease Paternal Grandfather   . Diabetes Paternal Grandfather   . Alcohol abuse Other   . Arthritis Other     Social History Social History  Substance Use Topics  . Smoking status: Former Smoker    Packs/day: 1.00    Years: 34.00    Types: Cigarettes    Quit date: 06/06/1990  . Smokeless tobacco: Never Used     Comment: widowed 1991 - lives with SO SKatharine Lookwho is RTherapist, sports . Alcohol use 0.6 oz/week    1 Glasses of wine per week     Allergies    Benzodiazepines; Coreg [carvedilol]; Diazepam; Lisinopril; Mobic [meloxicam]; Sertraline hcl; Tramadol; Atorvastatin; Compazine [prochlorperazine maleate]; Ondansetron; Promethazine; and Acyclovir and related   Review of Systems Review of Systems  All other systems reviewed and are negative.    Physical Exam Updated Vital Signs BP 138/71 (BP Location: Right Arm)   Pulse (!) 57   Temp 97.5 F (36.4 C) (Oral)   Resp 15   SpO2 98%   Physical Exam  Constitutional: He is oriented to person, place, and time. He appears well-developed and well-nourished. No distress.  HENT:  Head: Normocephalic and atraumatic.  Nose: Nose normal.  Eyes: Conjunctivae are normal.  Neck: Neck supple. No tracheal deviation present.  Cardiovascular: Normal rate and regular rhythm.   Pulmonary/Chest: Effort normal. No respiratory distress.  Abdominal: Soft. Normal appearance. He exhibits no distension. There is no tenderness. There is no rigidity and no guarding.  Musculoskeletal:       Right shoulder: He exhibits tenderness (posterior) and pain. He exhibits normal range of motion, no bony tenderness, no deformity and normal strength.  Mild left lower back tenderness over paraspinal muscles with no midline tenderness. No overlying ecchymosis  Neurological: He is alert and oriented to person, place, and time.  Skin: Skin is warm and dry.  Psychiatric: He has a normal mood and affect.     ED Treatments / Results  Labs (all labs ordered are listed, but only abnormal results are displayed) Labs Reviewed - No data to display  EKG  EKG Interpretation None       Radiology Dg Pelvis 1-2 Views  Result Date: 05/14/2016 CLINICAL DATA:  Pain after fall. EXAM: PELVIS - 1-2 VIEW COMPARISON:  None. FINDINGS: The patient is status post right hip replacement. The right hip hardware are in good position. The pelvic bones are intact. No left hip fracture identified. No dislocation. IMPRESSION: Negative.  Electronically Signed   By: DDorise BullionIII M.D  On: 05/14/2016 13:20   Dg Shoulder Right  Result Date: 05/14/2016 CLINICAL DATA:  Fall on ice yesterday with right shoulder pain. EXAM: RIGHT SHOULDER - 2+ VIEW COMPARISON:  Chest x-ray 09/22/2015 FINDINGS: Mild degenerate change of the [facility] joint and glenohumeral joints. No evidence of acute fracture or dislocation of the shoulder girdle. Multiple old right lateral rib fractures. IMPRESSION: No acute findings. Electronically Signed   By: Marin Olp M.D.   On: 05/14/2016 14:08    Procedures Procedures (including critical care time)  Medications Ordered in ED Medications - No data to display   Initial Impression / Assessment and Plan / ED Course  I have reviewed the triage vital signs and the nursing notes.  Pertinent labs & imaging results that were available during my care of the patient were reviewed by me and considered in my medical decision making (see chart for details).  Clinical Course     75 y.o. male presents with GLF yesterday while at work. Woke up with worsening pain today. Appears to have soft tissue injuries without evidence of bony or viscous injury on today's visit with low energy mechanism. Patient was recommended to take short course of scheduled NSAIDs and engage in early mobility as definitive treatment. Plan to follow up with PCP as needed and return precautions discussed for worsening or new concerning symptoms.   Final Clinical Impressions(s) / ED Diagnoses   Final diagnoses:  Fall from standing, initial encounter  Contusion of multiple sites of trunk, initial encounter    New Prescriptions New Prescriptions   No medications on file     Leo Grosser, MD 05/14/16 [phone]

## 2016-05-14 NOTE — ED Notes (Signed)
ED Provider at bedside. 

## 2016-05-14 NOTE — ED Triage Notes (Signed)
patient slipped and fell on ice yesterday landing on right side. Complains of left flank pain, right shoulder, right elbow, and has small non-bleeding laceration to scalp, no loc. Also wants right hip checked due to surgery on hip in May. Alert and oriented

## 2016-05-19 ENCOUNTER — Ambulatory Visit (INDEPENDENT_AMBULATORY_CARE_PROVIDER_SITE_OTHER): Payer: Medicare Other | Admitting: Internal Medicine

## 2016-05-19 ENCOUNTER — Encounter: Payer: Self-pay | Admitting: Internal Medicine

## 2016-05-19 VITALS — BP 138/76 | HR 67 | Temp 97.5°F | Resp 20 | Wt 200.0 lb

## 2016-05-19 DIAGNOSIS — M545 Low back pain, unspecified: Secondary | ICD-10-CM

## 2016-05-19 DIAGNOSIS — I2510 Atherosclerotic heart disease of native coronary artery without angina pectoris: Secondary | ICD-10-CM

## 2016-05-19 DIAGNOSIS — I10 Essential (primary) hypertension: Secondary | ICD-10-CM | POA: Diagnosis not present

## 2016-05-19 DIAGNOSIS — K5730 Diverticulosis of large intestine without perforation or abscess without bleeding: Secondary | ICD-10-CM | POA: Diagnosis not present

## 2016-05-19 MED ORDER — TIZANIDINE HCL 4 MG PO TABS: 4.0000 mg | ORAL_TABLET | Freq: Four times a day (QID) | ORAL | 1 refills | Status: DC | PRN

## 2016-05-19 NOTE — Progress Notes (Signed)
Pre visit review using our clinic review tool, if applicable. No additional management support is needed unless otherwise documented below in the visit note. 

## 2016-05-19 NOTE — Progress Notes (Signed)
Subjective:    Patient ID: Troy Banks, male    DOB: 09/19/1940, 75 y.o.   MRN: [id number]  HPI  Here to f/u, incidentally had fall last Saturday to right hip, seen in ED but neg for acute hip fx, pain now much improved.  Also with hx of diverticulosis, admits to some increased nuts in diet and such, Troy Banks is concerned about LLQ swwelling w hich heis not sure is worse, but has been swolen in past with hx of diverticultiis.  He believes he most likley has msk pain primarily. No fever, not feeling ill, no loss of appetite, had some nausea (no ovmiting) asso with recent fall and pain, none today. As far as pain, he currently has left lower back pain.started post fall, started mod to severe  But now more moderate only, constant,  Worse to breathe deep, TENS unit helps.  O/w no other bladder change, fever, wt loss,  worsening LE pain/numbness/weakness, gait change or falls.   Has had several wks to months of recurring constipation and gas.  Has intentionally changed his diet with much reduced calories, Has lost 48 lbs by his count since the heart surgury.   Wt Readings from Last 3 Encounters:  05/19/16 200 lb (90.7 kg)  04/19/16 199 lb (90.3 kg)  01/05/16 206 lb 3.2 oz (93.5 kg)  Denies urinary symptoms such as dysuria, frequency, urgency, flank pain, hematuria or n/v, fever, chills.  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  Has hx of llq swelling since the urological surgury in 1983, also recent wt loss Past Medical History:  Diagnosis Date  . Anxiety    PTSD  . Aortic stenosis, moderate 03/27/2015   post AVR echo Echo 11/16: Mild LVH, EF 55-60%, normal wall motion, grade 1 diastolic dysfunction, bioprosthetic AVR okay (mean gradient 18 mmHg) with trivial AI, mild LAE, atrial septal aneurysm, small effusion   . Arthritis    "qwhere" (10/29/2015)  . Carotid artery stenosis    Carotid US 11/17: 1-39% bilateral ICA stenosis. F/u prn  . Cervical stenosis of spinal canal    fusion 2006  . CHF  (congestive heart failure) ([place])    "secondry to OHS"  . COLONIC POLYPS, HX OF   . Complication of anesthesia    "takes a lot to put him under and has woken up during surgery" Difficult to wake up . PTSD; "does not metabolize RX well"  . Difficult intubation   . DIVERTICULOSIS, COLON   . Dysrhythmia    2 bouts of afib post op and at home but corrected with medication.  . Familial tremor 03/19/2014  . GERD (gastroesophageal reflux disease)    uses Zantac  . GOUT   . Heart murmur   . High cholesterol   . HYPERTENSION    off ACEI 2010 because of hyperkalemia in setting of ARI  . Hypothyroidism    newly diaganosed 09/2015  . Kidney stones    remote open stone-retrieval on L  . Myocardial infarction    October 20th 2016  . OSA on CPAP   . 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 Type II diabetes mellitus ([place])    diet controlled (10/28/2015)   Past Surgical History:  Procedure Laterality Date  . ANTERIOR FUSION CERVICAL SPINE  2002  . AORTIC VALVE REPLACEMENT N/A 03/30/2015   Procedure: AORTIC VALVE REPLACEMENT (AVR);  Surgeon: Grace Isaac, MD;  Location: [facility]  OR;  Service: Open Heart Surgery;  Laterality: N/A;  . BACK SURGERY    . CARDIAC CATHETERIZATION N/A 03/26/2015   Procedure: Left Heart Cath and Coronary Angiography;  Surgeon: Leonie Man, MD;  Location: [facility];  Service: Cardiovascular;  Laterality: N/A;  . CARDIAC VALVE REPLACEMENT    . CORONARY ARTERY BYPASS GRAFT N/A 03/30/2015   Procedure: CORONARY ARTERY BYPASS GRAFTING (CABG) x four, using left internal mammary artery and left    leg greater saphenous vein harvested endoscopically ;  Surgeon: Grace Isaac, MD;  Location: [facility];  Service: Open Heart Surgery;  Laterality: N/A;  . HEMORRHOID BANDING  1970s  . INGUINAL HERNIA REPAIR Left 1962  . JOINT REPLACEMENT    . [place]   "cut me open"  . POSTERIOR FUSION  CERVICAL SPINE  2006  . TEE WITHOUT CARDIOVERSION N/A 03/30/2015   Procedure: TRANSESOPHAGEAL ECHOCARDIOGRAM (TEE);  Surgeon: Grace Isaac, MD;  Location: [facility];  Service: Open Heart Surgery;  Laterality: N/A;  . TESTICLE TORSION REDUCTION Right 1960  . TOE FUSION Left 1985 & 2004   "great toe"  . TONSILLECTOMY    . TOTAL HIP ARTHROPLASTY Right 10/27/2015   Procedure: RIGHT TOTAL HIP ARTHROPLASTY ANTERIOR APPROACH and steroid injection right foot;  Surgeon: Mcarthur Rossetti, MD;  Location: [facility];  Service: Orthopedics;  Laterality: Right;    reports that he quit smoking about 25 years ago. His smoking use included Cigarettes. He has a 34.00 pack-year smoking history. He has never used smokeless tobacco. He reports that he drinks about 0.6 oz of alcohol per week . He reports that he does not use drugs. family history includes Alcohol abuse in his other; Arthritis in his other; Diabetes in his mother and paternal grandfather; Heart disease in his father, maternal grandfather, maternal grandmother, mother, and paternal grandfather; Hypertension in his father, maternal grandfather, maternal grandmother, mother, and paternal grandfather; Prostate cancer in his maternal uncle. Allergies  Allergen Reactions  . Benzodiazepines Other (See Comments)    Patient states he gets stroke symptoms with benzo's.   . Coreg [Carvedilol] Other (See Comments)    Patient poor metabolizer of CYP2D6 - Coreg undergoes extensive hepatic (including 2D6)  . Diazepam Other (See Comments)    Stroke sx  . Lisinopril Other (See Comments)    Pt states C450, and hyperkalemia  . Mobic [Meloxicam] Other (See Comments)    Unspecified  . Sertraline Hcl Other (See Comments)    Pt states all meds that are broken down with C-450- if so have sxs of stroke  . Tramadol Other (See Comments)    Unable to take C450  . Atorvastatin Other (See Comments)    MYALGIAS  . Compazine [Prochlorperazine Maleate] Other (See Comments)      N300  . Ondansetron Other (See Comments)    C450  . Promethazine Other (See Comments)    C450  . Acyclovir And Related Other (See Comments)   Current Outpatient Prescriptions on File Prior to Visit  Medication Sig Dispense Refill  . allopurinol (ZYLOPRIM) 100 MG tablet TAKE 1 TABLET BY MOUTH 2 TIMES DAILY 180 tablet 0  . amLODipine (NORVASC) 10 MG tablet TAKE 1 TABLET BY MOUTH EVERY DAY 90 tablet 1  . Ascorbic Acid (VITAMIN C) 1000 MG tablet Take 1,000 mg by mouth daily.    [name] aspirin 81 MG tablet Take 81 mg by mouth daily.    . blood glucose meter kit and  supplies KIT Use monitor to check blood sugar twice a day. DX E11.09 1 each 0  . Blood Glucose Monitoring Suppl (ONE TOUCH ULTRA 2) W/DEVICE KIT Test up to two times daily 1 each 1  . Cholecalciferol (VITAMIN D3) 2000 UNITS TABS Take 2,000 Units by mouth daily.    . cyanocobalamin 2000 MCG tablet Take 2,000 mcg by mouth daily.    . furosemide (LASIX) 40 MG tablet TAKE 1 TABLET BY MOUTH EVERY OTHER DAY 30 tablet 6  . glucose blood (ONE TOUCH TEST STRIPS) test strip Test up to two times daily 100 each 12  . levothyroxine (LEVOTHROID) 25 MCG tablet Take 1 tablet (25 mcg total) by mouth daily before breakfast. 90 tablet 3  . Misc Natural Products (CURCUMAX PRO) TABS Take 1 tablet by mouth 2 (two) times daily.    . ONE TOUCH LANCETS MISC Test up to two times daily 100 each 11  . OVER THE COUNTER MEDICATION Take 1 capsule by mouth 2 (two) times daily. Tumeric 556m    . primidone (MYSOLINE) 50 MG tablet TAKE 2 TABLETS BY MOUTH EVERY MORNING AND TAKE 1 TABLET IN THE EVENING 270 tablet 1  . ranitidine (ZANTAC) 150 MG tablet TAKE 1 TABLET BY MOUTH 2 TIMES DAILY 180 tablet 0  . rosuvastatin (CRESTOR) 10 MG tablet Take 0.5 tablets (5 mg total) by mouth daily. 30 tablet 6   No current facility-administered medications on file prior to visit.    Review of Systems  Constitutional: Negative for unusual diaphoresis or night sweats HENT: Negative  for ear swelling or discharge Eyes: Negative for worsening visual haziness  Respiratory: Negative for choking and stridor.   Gastrointestinal: Negative for distension or worsening eructation Genitourinary: Negative for retention or change in urine volume.  Musculoskeletal: Negative for other MSK pain or swelling Skin: Negative for color change and worsening wound Neurological: Negative for tremors and numbness other than noted  Psychiatric/Behavioral: Negative for decreased concentration or agitation other than above   All other system neg per pt    Objective:   Physical Exam BP 138/76   Pulse 67   Temp 97.5 F (36.4 C) (Oral)   Resp 20   Wt 200 lb (90.7 kg)   SpO2 96%   BMI 30.41 kg/m  VS noted,  Constitutional: Pt appears in no apparent distress HENT: Head: NCAT.  Right Ear: External ear normal.  Left Ear: External ear normal.  Eyes: . Pupils are equal, round, and reactive to light. Conjunctivae and EOM are normal Neck: Normal range of motion. Neck supple.  Cardiovascular: Normal rate and regular rhythm.   Pulmonary/Chest: Effort normal and breath sounds without rales or wheezing.  Abd:  Soft, NT, ND, + BS but does have a nondiscrete asymmetry with left > right side enlarged - ? Obesity vs even hernia like Spine:  nontender but has marked left lumbar paravertebral tender spasm Neurological: Pt is alert. Not confused , motor grossly intact Skin: Skin is warm. No rash, no LE edema Psychiatric: Pt behavior is normal. No agitation.      Assessment & Plan:

## 2016-05-19 NOTE — Patient Instructions (Signed)
Please take all new medication as prescribed  - the muscle relaxer as needed ° °Please continue all other medications as before, and refills have been done if requested. ° °Please have the pharmacy call with any other refills you may need. ° °Please continue your efforts at being more active, low cholesterol diet, and weight control. ° °Please keep your appointments with your specialists as you may have planned ° ° ° ° °

## 2016-05-21 NOTE — Assessment & Plan Note (Signed)
I suspect this is MSK pain that is primarily the source of pain today, for muscle relaxer prn,  to f/u any worsening symptoms or concerns

## 2016-05-21 NOTE — Assessment & Plan Note (Signed)
stable overall by history and exam, recent data reviewed with pt, and pt to continue medical treatment as before,  to f/u any worsening symptoms or concerns BP Readings from Last 3 Encounters:  05/19/16 138/76  05/14/16 125/73  04/19/16 (!) 148/80

## 2016-05-21 NOTE — Assessment & Plan Note (Signed)
D/w pt and reassured, no evidence for diverticulitis at this time, would hold on lab or other testing pending further hx or exam changes

## 2016-05-26 ENCOUNTER — Other Ambulatory Visit: Payer: Self-pay | Admitting: Internal Medicine

## 2016-06-02 ENCOUNTER — Telehealth: Payer: Self-pay | Admitting: Neurology

## 2016-06-02 NOTE — Telephone Encounter (Signed)
ok 

## 2016-06-02 NOTE — Telephone Encounter (Signed)
Patient up the primidone to 100 bid and wanted [place] to know [phone]

## 2016-06-02 NOTE — Telephone Encounter (Signed)
Patient was on 100 mg in the AM and 50 mg at night. Last visit he did not want to increase medication. Looking back at his office visits across [facility] his pulse has been steadily between 60-70. Dr. Carles Collet Juluis Rainier.

## 2016-06-10 ENCOUNTER — Ambulatory Visit (INDEPENDENT_AMBULATORY_CARE_PROVIDER_SITE_OTHER): Payer: Medicare Other | Admitting: Internal Medicine

## 2016-06-10 ENCOUNTER — Encounter: Payer: Self-pay | Admitting: Internal Medicine

## 2016-06-10 VITALS — BP 138/78 | HR 68 | Temp 98.0°F | Resp 20 | Wt 199.0 lb

## 2016-06-10 DIAGNOSIS — K645 Perianal venous thrombosis: Secondary | ICD-10-CM

## 2016-06-10 DIAGNOSIS — K648 Other hemorrhoids: Secondary | ICD-10-CM | POA: Diagnosis not present

## 2016-06-10 DIAGNOSIS — I10 Essential (primary) hypertension: Secondary | ICD-10-CM | POA: Diagnosis not present

## 2016-06-10 MED ORDER — LIDOCAINE-HYDROCORTISONE ACE 3-0.5 % RE CREA: 1.0000 | TOPICAL_CREAM | Freq: Two times a day (BID) | RECTAL | 1 refills | Status: DC | PRN

## 2016-06-10 NOTE — Progress Notes (Signed)
Pre visit review using our clinic review tool, if applicable. No additional management support is needed unless otherwise documented below in the visit note. 

## 2016-06-10 NOTE — Patient Instructions (Signed)
Please take all new medication as prescribed - the cream if needed  Please continue all other medications as before, and refills have been done if requested.  Please have the pharmacy call with any other refills you may need.  Please continue your efforts at being more active, low cholesterol diet, and weight control.  Please keep your appointments with your specialists as you may have planned  You will be contacted regarding the referral for: Gastroenterology  Please return in 3 months, or sooner if needed

## 2016-06-10 NOTE — Progress Notes (Signed)
Subjective:    Patient ID: Troy Banks, male    DOB: 11-11-1940, 76 y.o.   MRN: [id number]  HPI  Her to f/u, has been long haul truck driver in past, now works for medical courier service outside [place], initially had hemorrhoid issue starting 1971, tx per Dr Lennie Hummer with band type procedure successful, has had some issues since then off an on over the years, last episode about 12 yrs ago; Now with 1 wk onset grape sized firm area he is thinking another ext hemorrhoid with pain, dw dermatologist he visits last wk (also former truck driver) who told him prep H does not help.  Total time is 8 days, less pain and swelling today, no bleeding, no fever or drainage; Denies worsening reflux, abd pain, dysphagia, n/v, bowel change or blood.  No other new history Past Medical History:  Diagnosis Date  . Anxiety    PTSD  . Aortic stenosis, moderate 03/27/2015   post AVR echo Echo 11/16: Mild LVH, EF 55-60%, normal wall motion, grade 1 diastolic dysfunction, bioprosthetic AVR okay (mean gradient 18 mmHg) with trivial AI, mild LAE, atrial septal aneurysm, small effusion   . Arthritis    "qwhere" (10/29/2015)  . Carotid artery stenosis    Carotid US 11/17: 1-39% bilateral ICA stenosis. F/u prn  . Cervical stenosis of spinal canal    fusion 2006  . CHF (congestive heart failure) ([place])    "secondry to [facility]"  . COLONIC POLYPS, HX OF   . Complication of anesthesia    "takes a lot to put him under and has woken up during surgery" Difficult to wake up . PTSD; "does not metabolize RX well"  . Difficult intubation   . DIVERTICULOSIS, COLON   . Dysrhythmia    2 bouts of afib post op and at home but corrected with medication.  . Familial tremor 03/19/2014  . GERD (gastroesophageal reflux disease)    uses Zantac  . GOUT   . Heart murmur   . High cholesterol   . HYPERTENSION    off ACEI 2010 because of hyperkalemia in setting of ARI  . Hypothyroidism    newly diaganosed 09/2015  . Kidney stones    remote open stone-retrieval on L  . Myocardial infarction    October 20th 2016  . OSA on CPAP   . 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 Type II diabetes mellitus ([place])    diet controlled (10/28/2015)   Past Surgical History:  Procedure Laterality Date  . ANTERIOR FUSION CERVICAL SPINE  2002  . AORTIC VALVE REPLACEMENT N/A 03/30/2015   Procedure: AORTIC VALVE REPLACEMENT (AVR);  Surgeon: Grace Isaac, MD;  Location: [facility];  Service: Open Heart Surgery;  Laterality: N/A;  . BACK SURGERY    . CARDIAC CATHETERIZATION N/A 03/26/2015   Procedure: Left Heart Cath and Coronary Angiography;  Surgeon: Leonie Man, MD;  Location: [facility];  Service: Cardiovascular;  Laterality: N/A;  . CARDIAC VALVE REPLACEMENT    . CORONARY ARTERY BYPASS GRAFT N/A 03/30/2015   Procedure: CORONARY ARTERY BYPASS GRAFTING (CABG) x four, using left internal mammary artery and left    leg greater saphenous vein harvested endoscopically ;  Surgeon: Grace Isaac, MD;  Location: [facility];  Service: Open Heart Surgery;  Laterality: N/A;  . HEMORRHOID BANDING  1970s  . INGUINAL HERNIA REPAIR Left 1962  . JOINT REPLACEMENT    .  [place]   "cut me open"  . POSTERIOR FUSION CERVICAL SPINE  2006  . TEE WITHOUT CARDIOVERSION N/A 03/30/2015   Procedure: TRANSESOPHAGEAL ECHOCARDIOGRAM (TEE);  Surgeon: Grace Isaac, MD;  Location: [facility];  Service: Open Heart Surgery;  Laterality: N/A;  . TESTICLE TORSION REDUCTION Right 1960  . TOE FUSION Left 1985 & 2004   "great toe"  . TONSILLECTOMY    . TOTAL HIP ARTHROPLASTY Right 10/27/2015   Procedure: RIGHT TOTAL HIP ARTHROPLASTY ANTERIOR APPROACH and steroid injection right foot;  Surgeon: Mcarthur Rossetti, MD;  Location: [facility];  Service: Orthopedics;  Laterality: Right;    reports that he quit smoking about 26 years ago. His smoking use included Cigarettes. He has a 34.00  pack-year smoking history. He has never used smokeless tobacco. He reports that he drinks about 0.6 oz of alcohol per week . He reports that he does not use drugs. family history includes Alcohol abuse in his other; Arthritis in his other; Diabetes in his mother and paternal grandfather; Heart disease in his father, maternal grandfather, maternal grandmother, mother, and paternal grandfather; Hypertension in his father, maternal grandfather, maternal grandmother, mother, and paternal grandfather; Prostate cancer in his maternal uncle. Allergies  Allergen Reactions  . Benzodiazepines Other (See Comments)    Patient states he gets stroke symptoms with benzo's.   . Coreg [Carvedilol] Other (See Comments)    Patient poor metabolizer of CYP2D6 - Coreg undergoes extensive hepatic (including 2D6)  . Diazepam Other (See Comments)    Stroke sx  . Lisinopril Other (See Comments)    Pt states C450, and hyperkalemia  . Mobic [Meloxicam] Other (See Comments)    Unspecified  . Sertraline Hcl Other (See Comments)    Pt states all meds that are broken down with C-450- if so have sxs of stroke  . Tramadol Other (See Comments)    Unable to take C450  . Atorvastatin Other (See Comments)    MYALGIAS  . Compazine [Prochlorperazine Maleate] Other (See Comments)    W888  . Ondansetron Other (See Comments)    C450  . Promethazine Other (See Comments)    C450  . Acyclovir And Related Other (See Comments)   Current Outpatient Prescriptions on File Prior to Visit  Medication Sig Dispense Refill  . allopurinol (ZYLOPRIM) 100 MG tablet TAKE 1 TABLET BY MOUTH 2 TIMES DAILY 180 tablet 0  . amLODipine (NORVASC) 10 MG tablet TAKE 1 TABLET BY MOUTH EVERY DAY 90 tablet 1  . Ascorbic Acid (VITAMIN C) 1000 MG tablet Take 1,000 mg by mouth daily.    [name] aspirin 81 MG tablet Take 81 mg by mouth daily.    . blood glucose meter kit and supplies KIT Use monitor to check blood sugar twice a day. DX E11.09 1 each 0  . Blood  Glucose Monitoring Suppl (ONE TOUCH ULTRA 2) W/DEVICE KIT Test up to two times daily 1 each 1  . Cholecalciferol (VITAMIN D3) 2000 UNITS TABS Take 2,000 Units by mouth daily.    . cyanocobalamin 2000 MCG tablet Take 2,000 mcg by mouth daily.    . furosemide (LASIX) 40 MG tablet TAKE 1 TABLET BY MOUTH EVERY OTHER DAY 30 tablet 6  . glucose blood (ONE TOUCH TEST STRIPS) test strip Test up to two times daily 100 each 12  . levothyroxine (LEVOTHROID) 25 MCG tablet Take 1 tablet (25 mcg total) by mouth daily before breakfast. 90 tablet 3  . Misc Natural Products (  CURCUMAX PRO) TABS Take 1 tablet by mouth 2 (two) times daily.    . ONE TOUCH LANCETS MISC Test up to two times daily 100 each 11  . OVER THE COUNTER MEDICATION Take 1 capsule by mouth 2 (two) times daily. Tumeric 574m    . primidone (MYSOLINE) 50 MG tablet TAKE 2 TABLETS BY MOUTH EVERY MORNING AND TAKE 1 TABLET IN THE EVENING 270 tablet 1  . ranitidine (ZANTAC) 150 MG tablet TAKE 1 TABLET BY MOUTH 2 TIMES DAILY 180 tablet 0  . rosuvastatin (CRESTOR) 10 MG tablet Take 0.5 tablets (5 mg total) by mouth daily. 30 tablet 6  . tiZANidine (ZANAFLEX) 4 MG tablet Take 1 tablet by mouth every 6 hours as needed for muscle spasms. 40 tablet 0   No current facility-administered medications on file prior to visit.    Review of Systems  Constitutional: Negative for unusual diaphoresis or night sweats HENT: Negative for ear swelling or discharge Eyes: Negative for worsening visual haziness  Respiratory: Negative for choking and stridor.   Gastrointestinal: Negative for distension or worsening eructation Genitourinary: Negative for retention or change in urine volume.  Musculoskeletal: Negative for other MSK pain or swelling Skin: Negative for color change and worsening wound Neurological: Negative for tremors and numbness other than noted  Psychiatric/Behavioral: Negative for decreased concentration or agitation other than above   All other system  neg per pt    Objective:   Physical Exam BP 138/78   Pulse 68   Temp 98 F (36.7 C) (Oral)   Resp 20   Wt 199 lb (90.3 kg)   SpO2 98%   BMI 30.26 kg/m  VS noted,  Constitutional: Pt appears in no apparent distress HENT: Head: NCAT.  Right Ear: External ear normal.  Left Ear: External ear normal.  Eyes: . Pupils are equal, round, and reactive to light. Conjunctivae and EOM are normal Neck: Normal range of motion. Neck supple.  Cardiovascular: Normal rate and regular rhythm.   Pulmonary/Chest: Effort normal and breath sounds without rales or wheezing.  Abd:  Soft, NT, ND, + BS DRE: + mild tender ext hemorrhoid, no active bleeding Neurological: Pt is alert. Not confused , motor grossly intact Skin: Skin is warm. No rash, no LE edema Psychiatric: Pt behavior is normal. No agitation.  No other new exam findings    Assessment & Plan:

## 2016-06-11 NOTE — Assessment & Plan Note (Signed)
Mild, 8 days present now symptomatically improved, no bleeding, for anamantle HC prn,  to f/u any worsening symptoms or concerns

## 2016-06-11 NOTE — Assessment & Plan Note (Signed)
Also a recurrent issue for pt, asks for GI referral

## 2016-06-11 NOTE — Assessment & Plan Note (Signed)
stable overall by history and exam, recent data reviewed with pt, and pt to continue medical treatment as before,  to f/u any worsening symptoms or concerns BP Readings from Last 3 Encounters:  06/10/16 138/78  05/19/16 138/76  05/14/16 125/73

## 2016-06-13 ENCOUNTER — Other Ambulatory Visit: Payer: Self-pay | Admitting: Neurology

## 2016-06-27 ENCOUNTER — Other Ambulatory Visit: Payer: Self-pay | Admitting: Internal Medicine

## 2016-07-03 NOTE — Progress Notes (Signed)
07/04/2016 Troy Banks   [birth year]  [id number]  Primary Physician Cathlean Cower, MD Primary Cardiologist: Dr [name]  HPI:  76 y.o. Male seen for follow up CAD and Afib. He was admitted 10/19-10/31/16 with a non-STEMI. LHC demonstrated 3 vessel CAD. He underwent CABG with LIMA-LAD, SVG-intermediate, SVG-LCx, SVG-distal RCA and bioprosthetic AVR. Atenolol was initiated but later stopped secondary to bradycardia. He did develop atrial fibrillation. He was placed on amiodarone with return to NSR. He has maintained NSR and has recovered well. He has an essential tremor and is on Primidone chronically. 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 Amiodarone was stopped in January 2017 at the time he was hospitalized for an acute gout attack in his [place]. He has maintained NSR.  She is retired Quarry manager. He has a daughter who is a family physician in [place]. On follow up today he reports he is doing very well. Reports BP 130-140 at home. Notes some sternal incision sensitivity. We reduced Crestor last visit and lipid panel was satisfactory. He is concerned that he can't lose weight. 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   Current Outpatient Prescriptions  Medication Sig Dispense Refill  . allopurinol (ZYLOPRIM) 100 MG tablet TAKE 1 TABLET BY MOUTH 2 TIMES DAILY 180 tablet 0  . amLODipine (NORVASC) 10 MG tablet TAKE 1 TABLET BY MOUTH EVERY DAY 90 tablet 0  . Ascorbic Acid (VITAMIN C) 1000 MG tablet Take 1,000 mg by mouth daily.    [name] aspirin 81 MG tablet Take 81 mg by mouth daily.    . blood glucose meter kit and supplies KIT Use monitor to check blood sugar twice a day. DX E11.09 1 each 0  . Blood Glucose Monitoring Suppl (ONE TOUCH ULTRA 2) W/DEVICE KIT Test up to two times daily 1 each 1  . Cholecalciferol (VITAMIN D3) 2000 UNITS TABS Take 2,000 Units by mouth daily.    .  cyanocobalamin 2000 MCG tablet Take 2,000 mcg by mouth daily.    . furosemide (LASIX) 40 MG tablet TAKE 1 TABLET BY MOUTH EVERY OTHER DAY 30 tablet 6  . glucose blood (ONE TOUCH TEST STRIPS) test strip Test up to two times daily 100 each 12  . levothyroxine (SYNTHROID, LEVOTHROID) 25 MCG tablet TAKE 1 TABLET BY MOUTH EVERY DAY BEFORE BREAKFAST 90 tablet 0  . lidocaine-hydrocortisone (ANAMANTEL HC) 3-0.5 % CREA Place 1 Applicatorful rectally 2 (two) times daily as needed. 28.3 g 1  . Misc Natural Products (CURCUMAX PRO) TABS Take 1 tablet by mouth 2 (two) times daily.    . ONE TOUCH LANCETS MISC Test up to two times daily 100 each 11  . OVER THE COUNTER MEDICATION Take 1 capsule by mouth 2 (two) times daily. Tumeric 596m    . primidone (MYSOLINE) 50 MG tablet Take 2 tablets (100 mg total) by mouth 2 (two) times daily. 360 tablet 0  . ranitidine (ZANTAC) 150 MG tablet TAKE 1 TABLET BY MOUTH 2 TIMES DAILY 180 tablet 0  . rosuvastatin (CRESTOR) 10 MG tablet Take 0.5 tablets (5 mg total) by mouth daily. 30 tablet 6  . tiZANidine (ZANAFLEX) 4 MG tablet Take 1 tablet by mouth every 6 hours as needed for muscle spasms. 40 tablet 0   No current facility-administered medications for this visit.     Allergies  Allergen Reactions  . Benzodiazepines Other (See Comments)  Patient states he gets stroke symptoms with benzo's.   . Coreg [Carvedilol] Other (See Comments)    Patient poor metabolizer of CYP2D6 - Coreg undergoes extensive hepatic (including 2D6)  . Diazepam Other (See Comments)    Stroke sx  . Lisinopril Other (See Comments)    Pt states C450, and hyperkalemia  . Mobic [Meloxicam] Other (See Comments)    Unspecified  . Sertraline Hcl Other (See Comments)    Pt states all meds that are broken down with C-450- if so have sxs of stroke  . Tramadol Other (See Comments)    Unable to take C450  . Atorvastatin Other (See Comments)    MYALGIAS  . Compazine [Prochlorperazine Maleate] Other  (See Comments)    W960  . Ondansetron Other (See Comments)    C450  . Promethazine Other (See Comments)    C450  . Acyclovir And Related Other (See Comments)    Social History   Social History  . Marital status: Significant Other    Spouse name: N/A  . Number of children: N/A  . Years of education: N/A   Occupational History  . Retired Animal nutritionist    Social History Main Topics  . Smoking status: Former Smoker    Packs/day: 1.00    Years: 34.00    Types: Cigarettes    Quit date: 06/06/1990  . Smokeless tobacco: Never Used     Comment: widowed 1991 - lives with SO [name] who is Therapist, sports  . Alcohol use 0.6 oz/week    1 Glasses of wine per week  . Drug use: No  . Sexual activity: Yes   Other Topics Concern  . Not on file   Social History Narrative  . No narrative on file     Review of Systems: As noted in HPI All other systems reviewed and are otherwise negative except as noted above.    Blood pressure (!) 179/85, pulse 69, height 5' 8"  (1.727 m), weight 201 lb 6.4 oz (91.4 kg).  GENERAL:  Well appearing HEENT:  PERRL, EOMI, sclera are clear. Oropharynx is clear. NECK:  No jugular venous distention, carotid upstroke brisk and symmetric, no bruits, no thyromegaly or adenopathy LUNGS:  Clear to auscultation bilaterally CHEST:  Unremarkable HEART:  RRR,  PMI not displaced or sustained,S1 and S2 within normal limits, no S3, no S4: no clicks, no rubs, no murmurs. Sternal incision healed well with some keloid. ABD:  Soft, nontender. BS +, no masses or bruits. No hepatomegaly, no splenomegaly EXT:  2 + pulses throughout, no edema, no cyanosis no clubbing SKIN:  Warm and dry.  No rashes NEURO:  Alert and oriented x 3. Cranial nerves II through XII intact. PSYCH:  Cognitively intact  Lab Results  Component Value Date   WBC 11.8 (H) 10/28/2015   HGB 12.3 (L) 10/28/2015   HCT 38.9 (L) 10/28/2015   PLT 141 (L) 10/28/2015   GLUCOSE 111 (H) 03/04/2016   CHOL 116 (L)  03/04/2016   TRIG 79 03/04/2016   HDL 41 03/04/2016   LDLDIRECT 100.0 09/08/2014   LDLCALC 59 03/04/2016   ALT 16 03/04/2016   AST 16 03/04/2016   NA 142 03/04/2016   K 4.8 03/04/2016   CL 102 03/04/2016   CREATININE 0.94 03/04/2016   BUN 17 03/04/2016   CO2 28 03/04/2016   TSH 6.00 (H) 09/22/2015   PSA 1.27 09/22/2015   INR 1.47 03/30/2015   HGBA1C 6.2 (H) 10/21/2015   MICROALBUR 2.7 (H) 09/22/2015  EKG- not done today.   ASSESSMENT AND PLAN:  1. CAD s/p CABG in October 2016 following NSTEMI. Clinically doing well. He has made favorable lifestyle changes.  Not a candidate for beta blocker due to brady. Continue statin. 2. S/p bioprosthetic AVR. Reminded him of routine SBE prophylaxis. 3. Afib. Resolved. 4. Hyperlipidemia. Satisfactory on Crestor 5 mg daily 5. HTN. Elevated today but has been controlled at home. Will monitor and let me know if it stays high.   [name] MD,FACC 07/04/2016 8:55 AM

## 2016-07-04 ENCOUNTER — Ambulatory Visit (INDEPENDENT_AMBULATORY_CARE_PROVIDER_SITE_OTHER): Payer: Medicare Other | Admitting: Cardiology

## 2016-07-04 ENCOUNTER — Encounter: Payer: Self-pay | Admitting: Cardiology

## 2016-07-04 VITALS — BP 179/85 | HR 69 | Ht 68.0 in | Wt 201.4 lb

## 2016-07-04 DIAGNOSIS — I2510 Atherosclerotic heart disease of native coronary artery without angina pectoris: Secondary | ICD-10-CM | POA: Diagnosis not present

## 2016-07-04 DIAGNOSIS — Z789 Other specified health status: Secondary | ICD-10-CM

## 2016-07-04 DIAGNOSIS — Z951 Presence of aortocoronary bypass graft: Secondary | ICD-10-CM | POA: Diagnosis not present

## 2016-07-04 DIAGNOSIS — E8889 Other specified metabolic disorders: Secondary | ICD-10-CM

## 2016-07-04 DIAGNOSIS — I10 Essential (primary) hypertension: Secondary | ICD-10-CM

## 2016-07-04 DIAGNOSIS — Z952 Presence of prosthetic heart valve: Secondary | ICD-10-CM

## 2016-07-04 NOTE — Patient Instructions (Signed)
Continue your current therapy  Focus on increased aerobic activity  I will see you in 6 months.

## 2016-07-04 NOTE — Addendum Note (Signed)
Addended by: Kathyrn Lass on: 07/04/2016 09:12 AM   Modules accepted: Orders

## 2016-07-07 ENCOUNTER — Encounter: Payer: Self-pay | Admitting: Gastroenterology

## 2016-07-25 ENCOUNTER — Other Ambulatory Visit: Payer: Self-pay | Admitting: Internal Medicine

## 2016-07-25 ENCOUNTER — Other Ambulatory Visit: Payer: Self-pay | Admitting: Cardiology

## 2016-08-11 ENCOUNTER — Encounter: Payer: Self-pay | Admitting: Gastroenterology

## 2016-08-11 ENCOUNTER — Telehealth: Payer: Self-pay | Admitting: Gastroenterology

## 2016-08-11 ENCOUNTER — Ambulatory Visit (INDEPENDENT_AMBULATORY_CARE_PROVIDER_SITE_OTHER): Payer: Medicare Other | Admitting: Gastroenterology

## 2016-08-11 VITALS — BP 140/70 | HR 82 | Ht 68.0 in | Wt 199.0 lb

## 2016-08-11 DIAGNOSIS — K648 Other hemorrhoids: Secondary | ICD-10-CM

## 2016-08-11 DIAGNOSIS — Z86010 Personal history of colonic polyps: Secondary | ICD-10-CM

## 2016-08-11 DIAGNOSIS — I2510 Atherosclerotic heart disease of native coronary artery without angina pectoris: Secondary | ICD-10-CM | POA: Diagnosis not present

## 2016-08-11 NOTE — Telephone Encounter (Signed)
Spoke to Troy Banks, let her know that they would be using profolol for sedation. I have asked them to let the pre-visit nurse know of the difficulties of sedation, so that this information could be passed onto anesthesia department. Reassured her that every patient's chart is reviewed prior to the procedures.

## 2016-08-11 NOTE — Patient Instructions (Signed)
It has been recommended to you by your physician that you have a(n)Colonoscopy  completed. Per your request, we did not schedule the procedure(s) today. Please contact our office at [phone] should you decide to have the procedure completed. 

## 2016-08-11 NOTE — Progress Notes (Signed)
[facility] Consult Note:  History: FLORA RATZ 08/11/2016  Referring physician: Cathlean Cower, MD  Reason for consult/chief complaint: Hemorrhoids (was "grape size and hard as a rock" better now) and Hernia (?'able Left abdominal area, no pain)   Subjective  HPI:  This is a 76 year old man referred by primary care for a recent hemorrhoid exacerbation. Patient reports he has not had trouble with hemorrhoids sincerely 1960s when he had a banding procedure done. 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 He then had the protrusion of an enlarged lesion "the size of a grape" in the anal area. It was uncomfortable for several days, and he saw primary care. They suspect that it was a thrombosed external hemorrhoid, and Khair says it resolved on its own over a few days without any particular treatment. He is also concerned that the left side of his abdomen looks bigger than the right.  Menashe was last here to see Dr. Deatra Ina in July 2010, at which time a colonoscopy revealed 2 tubular adenomas (one of which was over a centimeter). He does not recall if he got a recall letter for colonoscopy, since Dr. Deatra Ina was planning to bring him back in 3 years.  He denies upper GI symptoms. He has no abdominal pain and his weight has been stable. ROS:  Review of Systems  Constitutional: Negative for appetite change and unexpected weight change.  HENT: Negative for mouth sores and voice change.   Eyes: Negative for pain and redness.  Respiratory: Negative for cough and shortness of breath.   Cardiovascular: Negative for chest pain and palpitations.  Genitourinary: Negative for dysuria and hematuria.  Musculoskeletal: Positive for arthralgias. Negative for myalgias.  Skin: Negative for pallor and rash.  Neurological: Negative for weakness and headaches.  Hematological: Negative for adenopathy.   Psychiatric/Behavioral:       Poor sleep at times due to PTSD     Past Medical History: Past Medical History:  Diagnosis Date  . Anxiety    PTSD  . Aortic stenosis, moderate 03/27/2015   post AVR echo Echo 11/16: Mild LVH, EF 55-60%, normal wall motion, grade 1 diastolic dysfunction, bioprosthetic AVR okay (mean gradient 18 mmHg) with trivial AI, mild LAE, atrial septal aneurysm, small effusion   . Arthritis    "qwhere" (10/29/2015)  . Carotid artery stenosis    Carotid US 11/17: 1-39% bilateral ICA stenosis. F/u prn  . Cervical stenosis of spinal canal    fusion 2006  . CHF (congestive heart failure) ([place])    "secondry to OHS"  . COLONIC POLYPS, HX OF   . Complication of anesthesia    "takes a lot to put him under and has woken up during surgery" Difficult to wake up . PTSD; "does not metabolize RX well"  . Difficult intubation   . DIVERTICULOSIS, COLON   . Dysrhythmia    2 bouts of afib post op and at home but corrected with medication.  . Familial tremor 03/19/2014  . GERD (gastroesophageal reflux disease)    uses Zantac  . GOUT   . Heart murmur   . High cholesterol   . HYPERTENSION    off ACEI 2010 because of hyperkalemia in setting of ARI  . Hypothyroidism    newly diaganosed 09/2015  . Kidney stones    remote open stone-retrieval on L  . Myocardial infarction    October 20th 2016  . OSA on CPAP   .  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 Type II diabetes mellitus ([place])    diet controlled (10/28/2015)     Past Surgical History: Past Surgical History:  Procedure Laterality Date  . ANTERIOR FUSION CERVICAL SPINE  2002  . AORTIC VALVE REPLACEMENT N/A 03/30/2015   Procedure: AORTIC VALVE REPLACEMENT (AVR);  Surgeon: Grace Isaac, MD;  Location: [facility];  Service: Open Heart Surgery;  Laterality: N/A;  . BACK SURGERY    . CARDIAC CATHETERIZATION N/A 03/26/2015   Procedure: Left Heart Cath and  Coronary Angiography;  Surgeon: Leonie Man, MD;  Location: [facility];  Service: Cardiovascular;  Laterality: N/A;  . CARDIAC VALVE REPLACEMENT    . CORONARY ARTERY BYPASS GRAFT N/A 03/30/2015   Procedure: CORONARY ARTERY BYPASS GRAFTING (CABG) x four, using left internal mammary artery and left    leg greater saphenous vein harvested endoscopically ;  Surgeon: Grace Isaac, MD;  Location: [facility];  Service: Open Heart Surgery;  Laterality: N/A;  . HEMORRHOID BANDING  1970s  . INGUINAL HERNIA REPAIR Left 1962  . JOINT REPLACEMENT    . [place]   "cut me open"  . POSTERIOR FUSION CERVICAL SPINE  2006  . TEE WITHOUT CARDIOVERSION N/A 03/30/2015   Procedure: TRANSESOPHAGEAL ECHOCARDIOGRAM (TEE);  Surgeon: Grace Isaac, MD;  Location: [facility];  Service: Open Heart Surgery;  Laterality: N/A;  . TESTICLE TORSION REDUCTION Right 1960  . TOE FUSION Left 1985 & 2004   "great toe"  . TONSILLECTOMY    . TOTAL HIP ARTHROPLASTY Right 10/27/2015   Procedure: RIGHT TOTAL HIP ARTHROPLASTY ANTERIOR APPROACH and steroid injection right foot;  Surgeon: Mcarthur Rossetti, MD;  Location: [facility];  Service: Orthopedics;  Laterality: Right;     Family History: Family History  Problem Relation Age of Onset  . Heart disease Mother   . Hypertension Mother   . Diabetes Mother   . Heart disease Father   . Hypertension Father   . Prostate cancer Maternal Uncle   . Hypertension Maternal Grandmother   . Heart disease Maternal Grandmother   . Hypertension Maternal Grandfather   . Heart disease Maternal Grandfather   . Hypertension Paternal Grandfather   . Heart disease Paternal Grandfather   . Diabetes Paternal Grandfather   . Alcohol abuse Other   . Arthritis Other     Social History: Social History   Social History  . Marital status: Significant Other    Spouse name: Karmen Bongo, RN  . Number of children: 2  . Years of education: N/A   Occupational History   . Retired Animal nutritionist    Social History Main Topics  . Smoking status: Former Smoker    Packs/day: 1.00    Years: 34.00    Types: Cigarettes    Quit date: 06/06/1990  . Smokeless tobacco: Never Used     Comment: widowed 1991 - lives with SO [name] who is Therapist, sports  . Alcohol use 0.6 oz/week    1 Glasses of wine per week     Comment: rare  . Drug use: No  . Sexual activity: Yes   Other Topics Concern  . None   Social History Narrative  . None    Allergies: Allergies  Allergen Reactions  . Benzodiazepines Other (See Comments)    Patient states he gets stroke symptoms with benzo's.   . Coreg [Carvedilol] Other (See  Comments)    Patient poor metabolizer of CYP2D6 - Coreg undergoes extensive hepatic (including 2D6)  . Diazepam Other (See Comments)    Stroke sx  . Lisinopril Other (See Comments)    Pt states C450, and hyperkalemia  . Mobic [Meloxicam] Other (See Comments)    Unspecified  . Sertraline Hcl Other (See Comments)    Pt states all meds that are broken down with C-450- if so have sxs of stroke  . Tramadol Other (See Comments)    Unable to take C450  . Atorvastatin Other (See Comments)    MYALGIAS  . Compazine [Prochlorperazine Maleate] Other (See Comments)    O878  . Ondansetron Other (See Comments)    C450  . Promethazine Other (See Comments)    C450  . Acyclovir And Related Other (See Comments)    Outpatient Meds: Current Outpatient Prescriptions  Medication Sig Dispense Refill  . allopurinol (ZYLOPRIM) 100 MG tablet TAKE 1 TABLET BY MOUTH 2 TIMES DAILY 180 tablet 0  . amLODipine (NORVASC) 10 MG tablet TAKE 1 TABLET BY MOUTH EVERY DAY 90 tablet 0  . Ascorbic Acid (VITAMIN C) 1000 MG tablet Take 1,000 mg by mouth daily.    [name] aspirin 81 MG tablet Take 81 mg by mouth daily.    . blood glucose meter kit and supplies KIT Use monitor to check blood sugar twice a day. DX E11.09 1 each 0  . Blood Glucose Monitoring Suppl (ONE TOUCH ULTRA 2) W/DEVICE KIT  Test up to two times daily 1 each 1  . Cholecalciferol (VITAMIN D3) 2000 UNITS TABS Take 2,000 Units by mouth daily.    . cyanocobalamin 2000 MCG tablet Take 2,000 mcg by mouth daily.    . furosemide (LASIX) 40 MG tablet TAKE 1 TABLET BY MOUTH EVERY OTHER DAY 45 tablet 3  . glucose blood (ONE TOUCH TEST STRIPS) test strip Test up to two times daily 100 each 12  . levothyroxine (SYNTHROID, LEVOTHROID) 25 MCG tablet TAKE 1 TABLET BY MOUTH EVERY DAY BEFORE BREAKFAST 90 tablet 0  . lidocaine-hydrocortisone (ANAMANTEL HC) 3-0.5 % CREA Place 1 Applicatorful rectally 2 (two) times daily as needed. 28.3 g 1  . Misc Natural Products (CURCUMAX PRO) TABS Take 1 tablet by mouth 2 (two) times daily.    . ONE TOUCH LANCETS MISC Test up to two times daily 100 each 11  . OVER THE COUNTER MEDICATION Take 1 capsule by mouth 2 (two) times daily. Tumeric 529m    . primidone (MYSOLINE) 50 MG tablet Take 2 tablets (100 mg total) by mouth 2 (two) times daily. 360 tablet 0  . ranitidine (ZANTAC) 150 MG tablet TAKE 1 TABLET BY MOUTH 2 TIMES DAILY 180 tablet 1  . rosuvastatin (CRESTOR) 10 MG tablet Take 0.5 tablets (5 mg total) by mouth daily. 30 tablet 6  . tiZANidine (ZANAFLEX) 4 MG tablet Take 1 tablet by mouth every 6 hours as needed for muscle spasms. 40 tablet 0   No current facility-administered medications for this visit.       ___________________________________________________________________ Objective   Exam:  BP 140/70   Pulse 82   Ht _0  (1.727 m)   Wt 199 lb (90.3 kg)   BMI 30.26 kg/m    General: this is a(n) Overweight and otherwise well-appearing older man, ambulatory, alert and conversational   Eyes: sclera anicteric, no redness  ENT: oral mucosa moist without lesions, no cervical or supraclavicular lymphadenopathy, good dentition  CV: RRR without murmur, S1/S2, no JVD,  no peripheral edema  Resp: clear to auscultation bilaterally, normal RR and effort noted  GI: soft, no  tenderness, with active bowel sounds. No guarding or palpable organomegaly noted. When standing, his left lower abdominal wall is more prominent in the right.  Skin; warm and dry, no rash or jaundice noted  Neuro: awake, alert and oriented x 3. Normal gross motor function and fluent speech Rectal: External skin tag, no fissure or tenderness, no palpable internal lesions.  Labs:  CBC Latest Ref Rng & Units 10/28/2015 10/21/2015 09/22/2015  WBC 4.0 - 10.5 K/uL 11.8(H) 6.0 6.6  Hemoglobin 13.0 - 17.0 g/dL 12.3(L) 15.1 14.8  Hematocrit 39.0 - 52.0 % 38.9(L) 44.4 44.0  Platelets 150 - 400 K/uL 141(L) 159 162.0     Radiologic Studies:  nml LVEF 11/16(after CABG/AVR)  Assessment: Encounter Diagnoses  Name Primary?  . Internal hemorrhoids   . History of colonic polyps Yes  . Coronary artery disease involving native coronary artery of native heart without angina pectoris     I suspect he probably had a recent prolapsed and strangulated internal hemorrhoid that finally reduced itself. He does not appear to have any external hemorrhoids of significant size. I advised him to have a colonoscopy for his history of colon polyps. He is reluctant regarding the risks. I explained the procedure in great detail, the type of sedation used: And the expectation that any polyps discovered would be removed. While those risks are increased at his age and with a heart condition, I feel that the benefits outweigh the risks. He would like some more time to consider it, he also needs to make arrangements for someone to bring him the day of the procedure and take a day off of work.  Plan:  Colonoscopy was advised. Some possible dates were given to him, and I gave him my card with our contact info so we will hopefully hear from him soon. Maintain regularity with daily fiber supplement as needed to reduce the risk of constipation and straining precipitating another hemorrhoid episode.  Thank you for the courtesy of  this consult.  Please call me with any questions or concerns.  Nelida Meuse III  CC: Cathlean Cower, MD

## 2016-08-25 ENCOUNTER — Telehealth (INDEPENDENT_AMBULATORY_CARE_PROVIDER_SITE_OTHER): Payer: Self-pay | Admitting: Orthopaedic Surgery

## 2016-08-25 NOTE — Telephone Encounter (Signed)
Please advise. Thanks.  

## 2016-08-25 NOTE — Telephone Encounter (Signed)
Patient called advised his right wrist is swollen and very painful to touch. Patient has an appointment 08/29/16 at 8:30am but, want to know if there is anything he can do in the meantime. The number to contact Mrs. Handler is [phone]

## 2016-08-25 NOTE — Telephone Encounter (Signed)
Have him try 800 mg advil 3 times daily with meals or 2 Aleve twice daily

## 2016-08-25 NOTE — Telephone Encounter (Signed)
Called pt to advise

## 2016-08-26 ENCOUNTER — Ambulatory Visit (AMBULATORY_SURGERY_CENTER): Payer: Self-pay

## 2016-08-26 ENCOUNTER — Telehealth: Payer: Self-pay

## 2016-08-26 ENCOUNTER — Telehealth: Payer: Self-pay | Admitting: Internal Medicine

## 2016-08-26 ENCOUNTER — Ambulatory Visit (INDEPENDENT_AMBULATORY_CARE_PROVIDER_SITE_OTHER): Payer: Medicare Other | Admitting: Internal Medicine

## 2016-08-26 ENCOUNTER — Encounter: Payer: Self-pay | Admitting: Internal Medicine

## 2016-08-26 VITALS — Ht 68.0 in | Wt 202.0 lb

## 2016-08-26 VITALS — BP 150/82 | HR 61 | Temp 97.8°F | Wt 201.0 lb

## 2016-08-26 DIAGNOSIS — M109 Gout, unspecified: Secondary | ICD-10-CM | POA: Diagnosis not present

## 2016-08-26 DIAGNOSIS — Z86010 Personal history of colonic polyps: Secondary | ICD-10-CM

## 2016-08-26 DIAGNOSIS — E119 Type 2 diabetes mellitus without complications: Secondary | ICD-10-CM | POA: Diagnosis not present

## 2016-08-26 DIAGNOSIS — I2510 Atherosclerotic heart disease of native coronary artery without angina pectoris: Secondary | ICD-10-CM | POA: Diagnosis not present

## 2016-08-26 DIAGNOSIS — I10 Essential (primary) hypertension: Secondary | ICD-10-CM

## 2016-08-26 MED ORDER — PREDNISONE 10 MG PO TABS: ORAL_TABLET | ORAL | 0 refills | Status: DC

## 2016-08-26 MED ORDER — NA SULFATE-K SULFATE-MG SULF 17.5-3.13-1.6 GM/177ML PO SOLN: 1.0000 | Freq: Once | ORAL | 0 refills | Status: AC

## 2016-08-26 MED ORDER — ALLOPURINOL 300 MG PO TABS: 300.0000 mg | ORAL_TABLET | Freq: Every day | ORAL | 3 refills | Status: DC

## 2016-08-26 MED ORDER — COLCHICINE 0.6 MG PO TABS: 0.6000 mg | ORAL_TABLET | Freq: Every day | ORAL | 3 refills | Status: DC

## 2016-08-26 MED ORDER — METHYLPREDNISOLONE ACETATE 80 MG/ML IJ SUSP
80.0000 mg | Freq: Once | INTRAMUSCULAR | Status: AC
Start: 2016-08-26 — End: 2016-08-26
  Administered 2016-08-26: 80 mg via INTRAMUSCULAR

## 2016-08-26 NOTE — Telephone Encounter (Signed)
error 

## 2016-08-26 NOTE — Telephone Encounter (Signed)
Troy Banks was seen in [facility] today.  I wanted to make sure that you were aware of the patient having C450 and his reactions to medications. The patient did bring a letter from [facility] stating that he was cleared to receive Propofol. Patient cannot receive any Benzodiazapines!!! Just want to make sure that he is cleared for [facility]. Thanks    Riki Sheer, LPN

## 2016-08-26 NOTE — Telephone Encounter (Signed)
He is OK for [place].  Thanks for checking. I will copy Troy Banks on this re: the BZD issue, but that is not routinely given for these cases.

## 2016-08-26 NOTE — Patient Instructions (Addendum)
You had the steroid shot today4  Please take all new medication as prescribed - the prednisone  Please take all new medication as prescribed - the colchicine daily  Ok to increase the allopurinol to 300 mg per day  Please continue all other medications as before, and refills have been done if requested.  Please have the pharmacy call with any other refills you may need.  Please keep your appointments with your specialists as you may have planned  Please return in 3 months, or sooner if needed

## 2016-08-26 NOTE — Progress Notes (Signed)
Denies allergies to eggs or soy products. Denies complication of anesthesia or sedation. Denies use of weight loss medication. Denies use of O2.   Emmi instructions declined.  

## 2016-08-26 NOTE — Progress Notes (Signed)
Subjective:    Patient ID: Troy Banks, male    DOB: August 03, 1940, 76 y.o.   MRN: [id number]  HPI  Here with acute episode right wrist and hand swelling, redness, pain without trauma, fever, red streaks.  Has hx of gout now with 3rd episode in 18 mo per pt, despite good med compliance.  Used to take colchicine in past but stopped after several years of no attacks.  Pt denies chest pain, increased sob or doe, wheezing, orthopnea, PND, increased LE swelling, palpitations, dizziness or syncope.  Pt denies new neurological symptoms such as new headache, or facial or extremity weakness or numbness.   Pt denies polydipsia, polyuria Past Medical History:  Diagnosis Date  . Allergy   . Anxiety    PTSD  . Aortic stenosis, moderate 03/27/2015   post AVR echo Echo 11/16: Mild LVH, EF 55-60%, normal wall motion, grade 1 diastolic dysfunction, bioprosthetic AVR okay (mean gradient 18 mmHg) with trivial AI, mild LAE, atrial septal aneurysm, small effusion   . Arthritis    "qwhere" (10/29/2015)  . Carotid artery stenosis    Carotid US 11/17: 1-39% bilateral ICA stenosis. F/u prn  . Cervical stenosis of spinal canal    fusion 2006  . CHF (congestive heart failure) ([place])    "secondry to OHS"  . COLONIC POLYPS, HX OF   . Complication of anesthesia    "takes a lot to put him under and has woken up during surgery" Difficult to wake up . PTSD; "does not metabolize RX well"  . Difficult intubation   . DIVERTICULOSIS, COLON   . Dysrhythmia    2 bouts of afib post op and at home but corrected with medication.  . Familial tremor 03/19/2014  . GERD (gastroesophageal reflux disease)    uses Zantac  . GOUT   . Heart murmur   . High cholesterol   . HYPERTENSION    off ACEI 2010 because of hyperkalemia in setting of ARI  . Hypothyroidism    newly diaganosed 09/2015  . Kidney stones    remote open stone-retrieval on L  . Myocardial infarction    October 20th 2016  . OSA on CPAP   . Poor drug  metabolizer due to cytochrome p450 CYP2D6 variant    confirmed heterozygous 10/24/14 labs  . Sleep apnea    On a CPAP  . Type II diabetes mellitus ([place])    diet controlled (10/28/2015)   Past Surgical History:  Procedure Laterality Date  . ANTERIOR FUSION CERVICAL SPINE  2002  . AORTIC VALVE REPLACEMENT N/A 03/30/2015   Procedure: AORTIC VALVE REPLACEMENT (AVR);  Surgeon: Grace Isaac, MD;  Location: [facility];  Service: Open Heart Surgery;  Laterality: N/A;  . BACK SURGERY    . CARDIAC CATHETERIZATION N/A 03/26/2015   Procedure: Left Heart Cath and Coronary Angiography;  Surgeon: Leonie Man, MD;  Location: [facility];  Service: Cardiovascular;  Laterality: N/A;  . CARDIAC VALVE REPLACEMENT    . CORONARY ARTERY BYPASS GRAFT N/A 03/30/2015   Procedure: CORONARY ARTERY BYPASS GRAFTING (CABG) x four, using left internal mammary artery and left    leg greater saphenous vein harvested endoscopically ;  Surgeon: Grace Isaac, MD;  Location: [facility];  Service: Open Heart Surgery;  Laterality: N/A;  . HEMORRHOID BANDING  1970s  . INGUINAL HERNIA REPAIR Left 1962  . JOINT REPLACEMENT    . [place]   "cut me open"  . POSTERIOR  FUSION CERVICAL SPINE  2006  . TEE WITHOUT CARDIOVERSION N/A 03/30/2015   Procedure: TRANSESOPHAGEAL ECHOCARDIOGRAM (TEE);  Surgeon: Grace Isaac, MD;  Location: [facility];  Service: Open Heart Surgery;  Laterality: N/A;  . TESTICLE TORSION REDUCTION Right 1960  . TOE FUSION Left 1985 & 2004   "great toe"  . TONSILLECTOMY    . TOTAL HIP ARTHROPLASTY Right 10/27/2015   Procedure: RIGHT TOTAL HIP ARTHROPLASTY ANTERIOR APPROACH and steroid injection right foot;  Surgeon: Mcarthur Rossetti, MD;  Location: [facility];  Service: Orthopedics;  Laterality: Right;    reports that he quit smoking about 26 years ago. His smoking use included Cigarettes. He has a 34.00 pack-year smoking history. He has never used smokeless tobacco. He reports that he  drinks about 0.6 oz of alcohol per week . He reports that he does not use drugs. family history includes Alcohol abuse in his other; Arthritis in his other; Diabetes in his mother and paternal grandfather; Heart disease in his father, maternal grandfather, maternal grandmother, mother, and paternal grandfather; Hypertension in his father, maternal grandfather, maternal grandmother, mother, and paternal grandfather; Prostate cancer in his maternal uncle. Allergies  Allergen Reactions  . Benzodiazepines Other (See Comments)    Patient states he gets stroke symptoms with benzo's.   . Coreg [Carvedilol] Other (See Comments)    Patient poor metabolizer of CYP2D6 - Coreg undergoes extensive hepatic (including 2D6)  . Diazepam Other (See Comments)    Stroke sx  . Lisinopril Other (See Comments)    Pt states C450, and hyperkalemia  . Mobic [Meloxicam] Other (See Comments)    Unspecified  . Sertraline Hcl Other (See Comments)    Pt states all meds that are broken down with C-450- if so have sxs of stroke  . Tramadol Other (See Comments)    Unable to take C450  . Atorvastatin Other (See Comments)    MYALGIAS  . Compazine [Prochlorperazine Maleate] Other (See Comments)    V035  . Ondansetron Other (See Comments)    C450  . Promethazine Other (See Comments)    C450  . Acyclovir And Related Other (See Comments)   Current Outpatient Prescriptions on File Prior to Visit  Medication Sig Dispense Refill  . amLODipine (NORVASC) 10 MG tablet TAKE 1 TABLET BY MOUTH EVERY DAY 90 tablet 0  . Ascorbic Acid (VITAMIN C) 1000 MG tablet Take 1,000 mg by mouth daily.    [name] aspirin 81 MG tablet Take 81 mg by mouth daily.    . blood glucose meter kit and supplies KIT Use monitor to check blood sugar twice a day. DX E11.09 1 each 0  . Blood Glucose Monitoring Suppl (ONE TOUCH ULTRA 2) W/DEVICE KIT Test up to two times daily 1 each 1  . Cholecalciferol (VITAMIN D3) 2000 UNITS TABS Take 2,000 Units by mouth daily.     . cyanocobalamin 2000 MCG tablet Take 2,000 mcg by mouth daily.    . furosemide (LASIX) 40 MG tablet TAKE 1 TABLET BY MOUTH EVERY OTHER DAY 45 tablet 3  . glucose blood (ONE TOUCH TEST STRIPS) test strip Test up to two times daily 100 each 12  . ibuprofen (ADVIL,MOTRIN) 200 MG tablet Take 200 mg by mouth every 6 (six) hours as needed.    [name] levothyroxine (SYNTHROID, LEVOTHROID) 25 MCG tablet TAKE 1 TABLET BY MOUTH EVERY DAY BEFORE BREAKFAST 90 tablet 0  . Misc Natural Products (CURCUMAX PRO) TABS Take 1 tablet by mouth 2 (two) times daily.    [name]  ONE TOUCH LANCETS MISC Test up to two times daily 100 each 11  . OVER THE COUNTER MEDICATION Take 1 capsule by mouth 2 (two) times daily. Tumeric 54m    . primidone (MYSOLINE) 50 MG tablet Take 2 tablets (100 mg total) by mouth 2 (two) times daily. 360 tablet 0  . ranitidine (ZANTAC) 150 MG tablet TAKE 1 TABLET BY MOUTH 2 TIMES DAILY 180 tablet 1  . rosuvastatin (CRESTOR) 10 MG tablet Take 0.5 tablets (5 mg total) by mouth daily. 30 tablet 6  . tiZANidine (ZANAFLEX) 4 MG tablet Take 1 tablet by mouth every 6 hours as needed for muscle spasms. 40 tablet 0   No current facility-administered medications on file prior to visit.    Review of Systems  Constitutional: Negative for unusual diaphoresis or night sweats HENT: Negative for ear swelling or discharge Eyes: Negative for worsening visual haziness  Respiratory: Negative for choking and stridor.   Gastrointestinal: Negative for distension or worsening eructation Genitourinary: Negative for retention or change in urine volume.  Musculoskeletal: Negative for other MSK pain or swelling Skin: Negative for color change and worsening wound Neurological: Negative for tremors and numbness other than noted  Psychiatric/Behavioral: Negative for decreased concentration or agitation other than above   All other system neg per pt  Objective:   Physical Exam BP (!) 150/82   Pulse 61   Temp 97.8 F (36.6  C) (Oral)   Wt 201 lb (91.2 kg)   SpO2 98%   BMI 30.56 kg/m  VS noted,  Constitutional: Pt appears in no apparent distress HENT: Head: NCAT.  Right Ear: External ear normal.  Left Ear: External ear normal.  Eyes: . Pupils are equal, round, and reactive to light. Conjunctivae and EOM are normal Neck: Normal range of motion. Neck supple.  Cardiovascular: Normal rate and regular rhythm.   Pulmonary/Chest: Effort normal and breath sounds without rales or wheezing.  Right wrist and hand with 2-3+ tender sweling and minor erythema, without fluctuance or red streaks Neurological: Pt is alert. Not confused , motor grossly intact Skin: Skin is warm. No rash, no LE edema Psychiatric: Pt behavior is normal. No agitation.     Assessment & Plan:

## 2016-08-26 NOTE — Progress Notes (Signed)
Pre visit review using our clinic review tool, if applicable. No additional management support is needed unless otherwise documented below in the visit note. 

## 2016-08-26 NOTE — Assessment & Plan Note (Signed)
Mod to severe right wrist since this am, for depomedrol IM 80, predpac asd,, declines pain med., ok for increased allopurinol to 300 qd, also add colchicine qd for 3rd episode in 18 mo, for uric acid with next labs

## 2016-08-27 NOTE — Assessment & Plan Note (Signed)
Mild elevation likely reactive, o/w stable overall by history and exam, recent data reviewed with pt, and pt to continue medical treatment as before,  to f/u any worsening symptoms or concerns BP Readings from Last 3 Encounters:  08/26/16 (!) 150/82  08/11/16 140/70  07/04/16 (!) 179/85

## 2016-08-27 NOTE — Assessment & Plan Note (Signed)
stable overall by history and exam, recent data reviewed with pt, and pt to continue medical treatment as before,  to f/u any worsening symptoms or concerns Lab Results  Component Value Date   HGBA1C 6.2 (H) 10/21/2015

## 2016-08-29 ENCOUNTER — Ambulatory Visit (INDEPENDENT_AMBULATORY_CARE_PROVIDER_SITE_OTHER): Payer: Medicare Other | Admitting: Physician Assistant

## 2016-09-16 ENCOUNTER — Ambulatory Visit (AMBULATORY_SURGERY_CENTER): Payer: Medicare Other | Admitting: Gastroenterology

## 2016-09-16 ENCOUNTER — Encounter: Payer: Self-pay | Admitting: Gastroenterology

## 2016-09-16 VITALS — BP 140/69 | HR 95 | Temp 98.6°F | Resp 20 | Ht 68.0 in | Wt 202.0 lb

## 2016-09-16 DIAGNOSIS — D120 Benign neoplasm of cecum: Secondary | ICD-10-CM

## 2016-09-16 DIAGNOSIS — Z951 Presence of aortocoronary bypass graft: Secondary | ICD-10-CM | POA: Diagnosis not present

## 2016-09-16 DIAGNOSIS — I252 Old myocardial infarction: Secondary | ICD-10-CM | POA: Diagnosis not present

## 2016-09-16 DIAGNOSIS — D123 Benign neoplasm of transverse colon: Secondary | ICD-10-CM

## 2016-09-16 DIAGNOSIS — I10 Essential (primary) hypertension: Secondary | ICD-10-CM | POA: Diagnosis not present

## 2016-09-16 DIAGNOSIS — D128 Benign neoplasm of rectum: Secondary | ICD-10-CM

## 2016-09-16 DIAGNOSIS — D124 Benign neoplasm of descending colon: Secondary | ICD-10-CM | POA: Diagnosis not present

## 2016-09-16 DIAGNOSIS — Z86010 Personal history of colonic polyps: Secondary | ICD-10-CM

## 2016-09-16 DIAGNOSIS — D122 Benign neoplasm of ascending colon: Secondary | ICD-10-CM | POA: Diagnosis not present

## 2016-09-16 DIAGNOSIS — E039 Hypothyroidism, unspecified: Secondary | ICD-10-CM | POA: Diagnosis not present

## 2016-09-16 DIAGNOSIS — I2510 Atherosclerotic heart disease of native coronary artery without angina pectoris: Secondary | ICD-10-CM | POA: Diagnosis not present

## 2016-09-16 MED ORDER — SODIUM CHLORIDE 0.9 % IV SOLN: 500.0000 mL | INTRAVENOUS | Status: DC

## 2016-09-16 NOTE — Patient Instructions (Signed)
YOU HAD AN ENDOSCOPIC PROCEDURE TODAY AT [facility]:   Refer to the procedure report that was given to you for any specific questions about what was found during the examination.  If the procedure report does not answer your questions, please call your gastroenterologist to clarify.  If you requested that your care partner not be given the details of your procedure findings, then the procedure report has been included in a sealed envelope for you to review at your convenience later.  YOU SHOULD EXPECT: Some feelings of bloating in the abdomen. Passage of more gas than usual.  Walking can help get rid of the air that was put into your GI tract during the procedure and reduce the bloating. If you had a lower endoscopy (such as a colonoscopy or flexible sigmoidoscopy) you may notice spotting of blood in your stool or on the toilet paper. If you underwent a bowel prep for your procedure, you may not have a normal bowel movement for a few days.  Please Note:  You might notice some irritation and congestion in your nose or some drainage.  This is from the oxygen used during your procedure.  There is no need for concern and it should clear up in a day or so.  SYMPTOMS TO REPORT IMMEDIATELY:   Following lower endoscopy (colonoscopy or flexible sigmoidoscopy):  Excessive amounts of blood in the stool  Significant tenderness or worsening of abdominal pains  Swelling of the abdomen that is new, acute  Fever of 100F or higher  For urgent or emergent issues, a gastroenterologist can be reached at any hour by calling [phone].   DIET:  We do recommend a small meal at first, but then you may proceed to your regular diet.  Drink plenty of fluids but you should avoid alcoholic beverages for 24 hours.  MEDICATIONS:  Continue present medications. No aspirin, ibuprofen, or other non-steroidal anti-inflammatory drugs for 3 days after polyp removal.  ACTIVITY:  You should plan to take it  easy for the rest of today and you should NOT DRIVE or use heavy machinery until tomorrow (because of the sedation medicines used during the test).    FOLLOW UP: Our staff will call the number listed on your records the next business day following your procedure to check on you and address any questions or concerns that you may have regarding the information given to you following your procedure. If we do not reach you, we will leave a message.  However, if you are feeling well and you are not experiencing any problems, there is no need to return our call.  We will assume that you have returned to your regular daily activities without incident.  If any biopsies were taken you will be contacted by phone or by letter within the next 1-3 weeks.  Please call us at [phone] if you have not heard about the biopsies in 3 weeks.   Please read all handouts given to you by your recovery nurse.  Thank you for allowing [place] to provide for your healthcare needs today.  SIGNATURES/CONFIDENTIALITY: You and/or your care partner have signed paperwork which will be entered into your electronic medical record.  These signatures attest to the fact that that the information above on your After Visit Summary has been reviewed and is understood.  Full responsibility of the confidentiality of this discharge information lies with you and/or your care-partner.

## 2016-09-16 NOTE — Progress Notes (Signed)
Called to room to assist during endoscopic procedure.  Patient ID and intended procedure confirmed with present staff. Received instructions for my participation in the procedure from the performing physician.  

## 2016-09-16 NOTE — Progress Notes (Signed)
To recovery, report to to Hines,RN, VSS

## 2016-09-16 NOTE — Op Note (Signed)
[facility] Patient Name: Troy Banks Procedure Date: 09/16/2016 2:03 PM MRN: [id number] Endoscopist: Ute. Loletha Carrow , MD Age: 76 Referring MD:  Date of Birth: September 11, 1940 Gender: Male Account #: [id number] Procedure:                Colonoscopy Indications:              Surveillance: Personal history of adenomatous                            polyps on last colonoscopy > 5 years ago (TA x 2 in                            2011) Medicines:                Monitored Anesthesia Care Procedure:                Pre-Anesthesia Assessment:                           - Prior to the procedure, a History and Physical                            was performed, and patient medications and                            allergies were reviewed. The patient's tolerance of                            previous anesthesia was also reviewed. The risks                            and benefits of the procedure and the sedation                            options and risks were discussed with the patient.                            All questions were answered, and informed consent                            was obtained. Anticoagulants: The patient has taken                            aspirin. It was decided not to withhold this                            medication prior to the procedure. ASA Grade                            Assessment: III - A patient with severe systemic                            disease. After reviewing the risks and benefits,  the patient was deemed in satisfactory condition to                            undergo the procedure.                           After obtaining informed consent, the colonoscope                            was passed under direct vision. Throughout the                            procedure, the patient's blood pressure, pulse, and                            oxygen saturations were monitored continuously. The   Colonoscope was introduced through the anus and                            advanced to the the cecum, identified by                            appendiceal orifice and ileocecal valve. The                            colonoscopy was performed without difficulty. The                            patient tolerated the procedure well. The quality                            of the bowel preparation was good. The ileocecal                            valve, appendiceal orifice, and rectum were                            photographed. The quality of the bowel preparation                            was evaluated using the BBPS [facility] Bowel                            Preparation Scale) with scores of: Right Colon = 2,                            Transverse Colon = 2 and Left Colon = 2. The total                            BBPS score equals 6. After lavage. The bowel                            preparation used was SUPREP. Scope In: 2:06:17 PM Scope Out: 2:40:57 PM Scope Withdrawal  Time: 0 hours 30 minutes 5 seconds  Total Procedure Duration: 0 hours 34 minutes 40 seconds  Findings:                 The perianal and digital rectal examinations were                            normal.                           Multiple diverticula were found in the entire colon.                           Two sessile polyps were found in the cecum. The                            polyps were 1 to 2 mm in size. These polyps were                            removed with a cold biopsy forceps. Resection and                            retrieval were complete.                           Three semi-pedunculated polyps were found in the                            rectum and proximal ascending colon. The polyps                            were 6 to 8 mm in size. These polyps were removed                            with a hot snare. Resection and retrieval were                            complete.                           Seven  sessile polyps were found in the descending                            colon, transverse colon and ascending colon. The                            polyps were 2 to 4 mm in size. These polyps were                            removed with a cold snare. Resection and retrieval                            were complete.  The exam was otherwise without abnormality on                            direct and retroflexion views. Complications:            No immediate complications. Estimated Blood Loss:     Estimated blood loss: none. Impression:               - Diverticulosis in the entire examined colon.                           - Two 1 to 2 mm polyps in the cecum, removed with a                            cold biopsy forceps. Resected and retrieved.                           - Three 6 to 8 mm polyps in the rectum and in the                            proximal ascending colon, removed with a hot snare.                            Resected and retrieved.                           - Seven 2 to 4 mm polyps in the descending colon,                            in the transverse colon and in the ascending colon,                            removed with a cold snare. Resected and retrieved.                           - The examination was otherwise normal on direct                            and retroflexion views. Recommendation:           - Patient has a contact number available for                            emergencies. The signs and symptoms of potential                            delayed complications were discussed with the                            patient. Return to normal activities tomorrow.                            Written discharge instructions were provided to the  patient.                           - Resume previous diet.                           - No aspirin, ibuprofen, naproxen, or other                            non-steroidal  anti-inflammatory drugs for 3 days                            after polyp removal.                           - Await pathology results.                           - Repeat colonoscopy is recommended for                            surveillance. The colonoscopy date will be                            determined after pathology results from today's                            exam become available for review. Henry L. Loletha Carrow, MD 09/16/2016 2:47:49 PM This report has been signed electronically.

## 2016-09-16 NOTE — Progress Notes (Signed)
Pt's states no medical or surgical changes since previsit or office visit. 

## 2016-09-19 ENCOUNTER — Telehealth: Payer: Self-pay | Admitting: *Deleted

## 2016-09-19 NOTE — Telephone Encounter (Signed)
  Follow up Call-  Call back number 09/16/2016 09/16/2016  Post procedure Call Back phone  # - [phone]  Permission to leave phone message Yes Yes  Some recent data might be hidden     Patient questions:  Do you have a fever, pain , or abdominal swelling? No. Pain Score  0 *  Have you tolerated food without any problems? Yes.    Have you been able to return to your normal activities? Yes.    Do you have any questions about your discharge instructions: Diet   No. Medications  No. Follow up visit  No.  Do you have questions or concerns about your Care? No.  Actions: * If pain score is 4 or above: No action needed, pain <4.

## 2016-09-21 ENCOUNTER — Encounter: Payer: Self-pay | Admitting: Gastroenterology

## 2016-09-29 ENCOUNTER — Telehealth: Payer: Self-pay | Admitting: Cardiology

## 2016-09-29 NOTE — Telephone Encounter (Signed)
Agree with recommendations  Peter Jordan MD, FACC  

## 2016-09-29 NOTE — Telephone Encounter (Signed)
Spoke with wife she states that he is having intermittant chest pain and pressure and SOB on exertion when walking the dog. She states that he does rest for awhile and then finishes walking and pt is at work and pt has intermittant left chest pressure and chest pain for over a week now, she states that pt will not go to ER but she states that she will try again to have him go, verbalizes understanding of direction to go when pt is have these sx. Denies any other sx nausea,vomiting, etc... She will bring pt to appt as scheduled on Tuesday 10-05-2106

## 2016-09-29 NOTE — Telephone Encounter (Signed)
New message  Set up appt  10/04/16 10a with [name] Pt c/o of Chest Pain: STAT if CP now or developed within 24 hours  1. Are you having CP right now? Yes   2. Are you experiencing any other symptoms (ex. SOB, nausea, vomiting, sweating)? Sob when walking   3. How long have you been experiencing CP? Over a week   4. Is your CP continuous or coming and going?  intermitten  5. Have you taken Nitroglycerin? Doesn't have any  ?

## 2016-10-04 ENCOUNTER — Ambulatory Visit: Payer: Medicare Other | Admitting: Cardiology

## 2016-10-13 NOTE — Progress Notes (Signed)
Subjective:   Troy Banks was seen in consultation in the movement disorder clinic at the request of [name], MD.  The evaluation is for tremor.  The records that were made available to me were reviewed.  Pt is accompanied by his significant other who supplements the hx.    The patient is a 76 y.o. left handed male with a history of tremor.  Pt reports tremor for at least 30 years.  Pt has noted increasing tremor over the years. Tremor is mostly in the right hand, but it is in the left as well and more rarely in the head as well.  Stress brings out the tremor.   He has never been to a neurologist for tremor.   Is currently on small amount of metoprolol - 25 mg - but that cannot be increased due to hx of bradycardia.  There is a family hx of tremor in his mother, maternal GM, maternal sister and maternal cousins.    Affected by caffeine:  No. (only drinks 2 cups coffee per week; drinks green tea) Affected by alcohol:  Used to decrease tremor but doesn't seem to any longer Affected by stress:  Yes.   Affected by fatigue:  Yes.   Spills soup if on spoon:  No. but has trouble with corn/peas Spills glass of liquid if full:  Yes.   Affects ADL's (tying shoes, brushing teeth, etc):  Yes.   (slow to tie shoes)  06/30/14 update:  Last visit, we started the patient on primidone for his tremor.  He cannot take Topamax because of history of nephrolithiasis or beta blockers because of bradycardia.  The patient reports that he is overall doing well.  For the first time in years, he was able to stand up and put on his jeans as he thinks that the medication helped his balance as well as tremor.  He has good and bad days in terms of tremor.  He has no SE in terms of tremor.  He states though that his metoprolol has been cut back since last visit because of bradycardia.    10/08/14 update:  Pt is on primidone 75m bid.  Pt states that initially it seemed helpful but nowl having tremor; had an episode  last week where couldn't shave because of tremor (not unusual to see tremor while shaving but almost unable to shave) and then whole body started shaking.  Lasted all day.  He had more word finding issues that day (has baseline word finding issues).  States that "I am concerned about something beyond familial tremor."  He is worried about PD.  Having cramping in the fingers and sometimes in the toes. Hands/fingers more than the feet.  He notices that his right arm "jerks", but thinks that that is due to "a botched neck surgery."  No significant issues with swallowing.  No diplopia.  10/30/14 update:  The patient is seen today, accompanied by his wife who supplements the history.  He had an MRI of the brain on 10/24/2014 that was unremarkable with exception of mild small vessel disease.  He had an MRI of the cervical spine demonstrating stenosis at several levels, but especially at the C6-C7 level and C3-C4 levels.  There was questionable thyroid enlargement and he was asked to follow up with his primary care physician in that regard.  On 10/28/2014 he underwent an EMG at our office.  This suggested chronic on active changes and intraspinal lesion at the right C6 level  and right S1 levels.  The etiology of this is unknown, but it could be anywhere from compressive etiologies to degeneration of the anterior horn cells.  He does not meet criteria for motor neuron disease at this point in time.  In regards to tremor, he has a few what he calls "episodes" in which his arms or legs will tremor more and may last 45 seconds.  BS was normal.  No known stress sets them off.  He may have a stuttering speech when it happens.    03/17/15 update:  The patient presents for follow-up today.  He is on primidone, 50 mg bid for essential tremor. Tremor has picked up but mostly with stress.  He thinks that we need to increase the primidone.   He is not a candidate for Topamax because of nephrolithiasis and is not a candidate for  beta blockers because of bradycardia.  We have worked him up extensively because of fasciculations.   No falls but "my balance is way off."   He had an MRI of the lumbar spine after we last met.  This was done on June 7.  There was evidence of severe facet arthrosis at the L4-L5 level and L5-S1 levels.  I told him that these findings do not explain all of his findings, including fasciculations.  He had an EEG on June 2 that was unremarkable.  This was done because of episodes of transient alteration of awareness that his wife had described.  His wife states that they didn't do the prolonged EEG because his insurance wouldn't allow it without a significant copay.  They are planning on r/s soon.  07/21/15 update:  The patient is seen today in follow-up, accompanied by his wife who supplements the history.  Much has happened since our last visit and I did review records.  Last visit, I increased his primidone to 100 mg in the morning and 50 mg at night.  He ended up being hospitalized in October, 2016 with an NSTEMI and ultimately required a coronary artery bypass graft and aortic valve replacement on 03/30/2015.  While in the hospital for this, he developed atrial fibrillation and was started on amiodarone.  Not surprisingly, this caused a significant increase in tremor.  He was rehospitalized in January, 2017 for acute gout and while he was in there he mentioned that tremor had increased.  His amiodarone was stopped.  He did have a repeat EMG since our last visit and this looked much better, without evidence of motor neuron disease.  He does feel that his balance has been a bit off.  Did not do cardiac rehab after CABG.    12/18/15 update:  The patient is seen today in follow-up.  He is accompanied by his wife who supplements the history.  I have reviewed prior records made available to me.  He remains on primidone, 100 mg in the morning and 50 mg at night. Been after amiodarone for 7 months and at the beginning of  May, he felt that tremor was much better but after hip surgery, felt that he regressed.  Surgery done under spinal anesthesia.  He had a total hip replacement on 10/27/2015.  Only did a week of therapy after that.  Felt that balance and tremor was worse after surgery.  Been losing weight through exercise.  Was put on synthroid 3 months ago.  04/19/16 update:  Pt f/u today accompanied by his wife who supplements the hx.  On primidone for ET,100 mg in  the AM/50 mg at night.  Has good days and bad days with tremor.  Reviewed Dr. Doug Sou notes.  Still off of amiodarone.  Is planning on selling home in spring.  Planning on moving to [place], [place].  Lost weight with lifestyle changes.  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  Mood much better now that back at work 5 days a week.  10/17/16 update:  Patient seen today in follow-up, accompanied by his wife who supplements the history.  The patient remains on primidone, 100 mg bid which he had increased since last visit.  He states that helped and "you can actually read my signature."  Biggest issue is low energy and he thinks that is related to his heart but he doesn't want to see the cardiologist.  He is too scared he would need another surgery.  Having MS chest pain over the scar.    Carotid ultrasound on 04/19/2016 was normal, with 1-39% stenosis bilaterally.  Little more imbalance.  Had to go on prednisone for gout.  Hasn't had A1C checked in a year.  Rarely checks BS at home - not checked in 3 weeks and "I know its high."    Current/Previously tried tremor medications: neurontin - was up to 300 mg bid for rotator cuff issues but never seemed to help tremor; back on it at low dose for hip pain.    Outside reports reviewed: historical medical records and referral letter/letters.  Allergies  Allergen Reactions  . Benzodiazepines Other (See Comments)    Patient states he gets stroke symptoms with benzo's.   . Coreg [Carvedilol] Other (See  Comments)    Patient poor metabolizer of CYP2D6 - Coreg undergoes extensive hepatic (including 2D6)  . Diazepam Other (See Comments)    Stroke sx  . Lisinopril Other (See Comments)    Pt states C450, and hyperkalemia  . Mobic [Meloxicam] Other (See Comments)    Unspecified  . Sertraline Hcl Other (See Comments)    Pt states all meds that are broken down with C-450- if so have sxs of stroke  . Tramadol Other (See Comments)    Unable to take C450  . Atorvastatin Other (See Comments)    MYALGIAS  . Compazine [Prochlorperazine Maleate] Other (See Comments)    F573  . Ondansetron Other (See Comments)    C450  . Promethazine Other (See Comments)    C450  . Acyclovir And Related Other (See Comments)    Outpatient Encounter Prescriptions as of 10/17/2016  Medication Sig  . allopurinol (ZYLOPRIM) 300 MG tablet Take 1 tablet (300 mg total) by mouth daily.  [name] amLODipine (NORVASC) 10 MG tablet TAKE 1 TABLET BY MOUTH EVERY DAY  . Ascorbic Acid (VITAMIN C) 1000 MG tablet Take 1,000 mg by mouth daily.  [name] aspirin 81 MG tablet Take 81 mg by mouth daily.  . blood glucose meter kit and supplies KIT Use monitor to check blood sugar twice a day. DX E11.09  . Blood Glucose Monitoring Suppl (ONE TOUCH ULTRA 2) W/DEVICE KIT Test up to two times daily  . Cholecalciferol (VITAMIN D3) 2000 UNITS TABS Take 2,000 Units by mouth daily.  . colchicine 0.6 MG tablet Take 1 tablet (0.6 mg total) by mouth daily.  . cyanocobalamin 2000 MCG tablet Take 2,000 mcg by mouth daily.  . furosemide (LASIX) 40 MG tablet TAKE 1 TABLET BY MOUTH EVERY OTHER DAY  . glucose blood (ONE TOUCH TEST STRIPS) test strip Test up to two  times daily  . ibuprofen (ADVIL,MOTRIN) 200 MG tablet Take 200 mg by mouth every 6 (six) hours as needed.  [name] levothyroxine (SYNTHROID, LEVOTHROID) 25 MCG tablet TAKE 1 TABLET BY MOUTH EVERY DAY BEFORE BREAKFAST  . Misc Natural Products (CURCUMAX PRO) TABS Take 1 tablet by mouth 2 (two) times daily.  .  ONE TOUCH LANCETS MISC Test up to two times daily  . OVER THE COUNTER MEDICATION Take 1 capsule by mouth 2 (two) times daily. Tumeric 576m  . primidone (MYSOLINE) 50 MG tablet Take 2 tablets (100 mg total) by mouth 2 (two) times daily.  . ranitidine (ZANTAC) 150 MG tablet TAKE 1 TABLET BY MOUTH 2 TIMES DAILY  . rosuvastatin (CRESTOR) 10 MG tablet Take 0.5 tablets (5 mg total) by mouth daily.  .Marland KitchentiZANidine (ZANAFLEX) 4 MG tablet Take 1 tablet by mouth every 6 hours as needed for muscle spasms.   Facility-Administered Encounter Medications as of 10/17/2016  Medication  . 0.9 %  sodium chloride infusion    Past Medical History:  Diagnosis Date  . Allergy   . Anxiety    PTSD  . Aortic stenosis, moderate 03/27/2015   post AVR echo Echo 11/16: Mild LVH, EF 55-60%, normal wall motion, grade 1 diastolic dysfunction, bioprosthetic AVR okay (mean gradient 18 mmHg) with trivial AI, mild LAE, atrial septal aneurysm, small effusion   . Arthritis    "qwhere" (10/29/2015)  . Carotid artery stenosis    Carotid [place]: 1-39% bilateral ICA stenosis. F/u prn  . Cervical stenosis of spinal canal    fusion 2006  . CHF (congestive heart failure) ([facility]    "secondry to [facility]"  . COLONIC POLYPS, HX OF   . Complication of anesthesia    "takes a lot to put him under and has woken up during surgery" Difficult to wake up . PTSD; "does not metabolize RX well"  . Difficult intubation   . DIVERTICULOSIS, COLON   . Dysrhythmia    2 bouts of afib post op and at home but corrected with medication.  . Familial tremor 03/19/2014  . GERD (gastroesophageal reflux disease)    uses Zantac  . GOUT   . Heart murmur   . High cholesterol   . HYPERTENSION    off ACEI 2010 because of hyperkalemia in setting of ARI  . Hypothyroidism    newly diaganosed 09/2015  . Kidney stones    remote open stone-retrieval on L  . Myocardial infarction ([facility]    October 20th 2016  . OSA on CPAP   . 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 Sleep apnea    On a CPAP  . Type II diabetes mellitus ([facility]    diet controlled (10/28/2015)    Past Surgical History:  Procedure Laterality Date  . ANTERIOR FUSION CERVICAL SPINE  2002  . AORTIC VALVE REPLACEMENT N/A 03/30/2015   Procedure: AORTIC VALVE REPLACEMENT (AVR);  Surgeon: EGrace Isaac MD;  [facility]  Service: Open Heart Surgery;  Laterality: N/A;  . BACK SURGERY    . CARDIAC CATHETERIZATION N/A 03/26/2015   Procedure: Left Heart Cath and Coronary Angiography;  Surgeon: DLeonie Man MD;  [facility];  Service: Cardiovascular;  Laterality: N/A;  . CARDIAC VALVE REPLACEMENT    . CORONARY ARTERY BYPASS GRAFT N/A 03/30/2015   Procedure: CORONARY ARTERY BYPASS GRAFTING (CABG) x four, using left internal mammary artery and left  leg greater saphenous vein harvested endoscopically ;  Surgeon: Grace Isaac, MD;  Location: [facility];  Service: Open Heart Surgery;  Laterality: N/A;  . HEMORRHOID BANDING  1970s  . INGUINAL HERNIA REPAIR Left 1962  . JOINT REPLACEMENT    . [place]   "cut me open"  . POSTERIOR FUSION CERVICAL SPINE  2006  . TEE WITHOUT CARDIOVERSION N/A 03/30/2015   Procedure: TRANSESOPHAGEAL ECHOCARDIOGRAM (TEE);  Surgeon: Grace Isaac, MD;  Location: [facility];  Service: Open Heart Surgery;  Laterality: N/A;  . TESTICLE TORSION REDUCTION Right 1960  . TOE FUSION Left 1985 & 2004   "great toe"  . TONSILLECTOMY    . TOTAL HIP ARTHROPLASTY Right 10/27/2015   Procedure: RIGHT TOTAL HIP ARTHROPLASTY ANTERIOR APPROACH and steroid injection right foot;  Surgeon: Mcarthur Rossetti, MD;  [facility];  Service: Orthopedics;  Laterality: Right;    Social History   Social History  . Marital status: Significant Other    Spouse name: Karmen Bongo, RN  . Number of children: 2  . Years of education: N/A   Occupational History  .  Retired Animal nutritionist    Social History Main Topics  . Smoking status: Former Smoker    Packs/day: 1.00    Years: 34.00    Types: Cigarettes    Quit date: 06/06/1990  . Smokeless tobacco: Never Used     Comment: widowed 1991 - lives with SO [name] who is Therapist, sports  . Alcohol use 0.6 oz/week    1 Glasses of wine per week     Comment: rare  . Drug use: No  . Sexual activity: Yes   Other Topics Concern  . Not on file   Social History Narrative  . No narrative on file    Family Status  Relation Status  . Mother Deceased       Heart Disease, tremor  . Father Deceased       Heart Disease  . Sister Deceased       tremor  . Brother (Not Specified)       unknown  . Brother Alive       healthy  . Son Alive       healthy  . Mat Uncle (Not Specified)  . MGM (Not Specified)  . MGF (Not Specified)  . PGF (Not Specified)  . Other (Not Specified)  . Neg Hx (Not Specified)    Review of Systems A complete 10 system ROS was obtained and was negative apart from what is mentioned.   Objective:   VITALS:   Vitals:   10/17/16 0808  BP: (!) 152/80  Pulse: (!) 52  SpO2: 97%  Weight: 208 lb (94.3 kg)  Height: _0  (1.727 m)   Wt Readings from Last 3 Encounters:  10/17/16 208 lb (94.3 kg)  09/16/16 202 lb (91.6 kg)  08/26/16 201 lb (91.2 kg)    Gen:  Appears stated age and in NAD. HEENT:  Normocephalic, atraumatic. The mucous membranes are moist. The superficial temporal arteries are without ropiness or tenderness.  No tongue fasciculations. Cardiovascular: bradycardic.  regular Lungs: Clear to auscultation bilaterally. Neck: There is L carotid bruit  NEUROLOGICAL:  Orientation:  The patient is alert and oriented x 3.  Cranial nerves: There is good facial symmetry. Soft palate rises symmetrically and there is no tongue deviation. Hearing is intact to conversational tone. Tone: Tone is good throughout. Sensation: Sensation is intact to light touch  throughout. Coordination:  The patient has no dysdiadichokinesia or dysmetria. Motor: Strength is 5/5 in the bilateral upper and lower extremities.  Shoulder shrug is equal bilaterally.  There is no pronator drift.   Gait and Station: The patient is able to ambulate without difficulty.   MOVEMENT EXAM: Tremor:  There is mild tremor in the UE, noted most significantly with action.  It doesn't change when he holds something of weight.  It is the same proximally and distally.  Minimal trouble with archimedes spirals.  There is mild head tremor.    Labs:    Chemistry      Component Value Date/Time   NA 142 03/04/2016 0918   K 4.8 03/04/2016 0918   CL 102 03/04/2016 0918   CO2 28 03/04/2016 0918   BUN 17 03/04/2016 0918   CREATININE 0.94 03/04/2016 0918      Component Value Date/Time   CALCIUM 9.7 03/04/2016 0918   ALKPHOS 54 03/04/2016 0918   AST 16 03/04/2016 0918   ALT 16 03/04/2016 0918   BILITOT 1.9 (H) 03/04/2016 0918     Lab Results  Component Value Date   HGBA1C 6.2 (H) 10/21/2015   Lab Results  Component Value Date   WBC 11.8 (H) 10/28/2015   HGB 12.3 (L) 10/28/2015   HCT 38.9 (L) 10/28/2015   MCV 90.3 10/28/2015   PLT 141 (L) 10/28/2015   Lab Results  Component Value Date   TSH 6.00 (H) 09/22/2015        Assessment/Plan:   1.  Essential Tremor.  -This is evidenced by the symmetrical nature and longstanding hx of gradually getting worse and family hx.  -continue primidone to 100 mg bid.    Doesn't want to increase it and doesn't want to change the dosage.  Previously contacted pharmacy previously due to his hx of cytochrome P450 problems but told that no interaction and not an inducer of this system.  Had increased tremor previously with amiodarone.    Risks, benefits, side effects and alternative therapies were discussed.  The opportunity to ask questions was given and they were answered to the best of my ability.  The patient expressed understanding and  willingness to follow the outlined treatment protocols.  -Not a candidate for topamax due to current nephrolithiasis and hx of same.  Not able to increase beta blocker due to h/o bradycardia.  May be able to push up neurontin but would need to push up to much higher dose.  2.  Dizziness/fatigue/balance change  -wonder if related to high blood sugars and possible diabetic peripheral neuropathy.  Told him needed to check blood sugars.  Told him needed to make appt with PCP for yearly physical and blood work.  Also told him he needed to make a follow up appt with Dr. [name].   3.  Fasciculations  -repeat EMG in 05/2015 much improved without evidence of MND  -The patient does have a compressive lesion in the cervical spine, but I'm not sure that that explains all of his symptoms.   4.  L carotid bruit  -Carotid ultrasound on 04/19/2016 was normal, with 1-39% stenosis bilaterally. 5.  Episodes of tremulousness, accompanied by potential speech change and ataxia according to his wife  - Routine EEG was negative and no further spells. 6.  Follow up is anticipated in the next few months, sooner should new neurologic issues arise.  Much greater than 50% of this visit was spent in counseling with the patient and the family.  Total face to face time:  35 min

## 2016-10-17 ENCOUNTER — Ambulatory Visit (INDEPENDENT_AMBULATORY_CARE_PROVIDER_SITE_OTHER): Payer: Medicare Other | Admitting: Neurology

## 2016-10-17 ENCOUNTER — Encounter: Payer: Self-pay | Admitting: Neurology

## 2016-10-17 VITALS — BP 152/80 | HR 52 | Ht 68.0 in | Wt 208.0 lb

## 2016-10-17 DIAGNOSIS — G250 Essential tremor: Secondary | ICD-10-CM | POA: Diagnosis not present

## 2016-10-17 DIAGNOSIS — R42 Dizziness and giddiness: Secondary | ICD-10-CM

## 2016-10-17 DIAGNOSIS — R5383 Other fatigue: Secondary | ICD-10-CM

## 2016-10-17 DIAGNOSIS — I2510 Atherosclerotic heart disease of native coronary artery without angina pectoris: Secondary | ICD-10-CM | POA: Diagnosis not present

## 2016-10-17 MED ORDER — PRIMIDONE 50 MG PO TABS: 100.0000 mg | ORAL_TABLET | Freq: Two times a day (BID) | ORAL | 1 refills | Status: DC

## 2016-10-18 ENCOUNTER — Ambulatory Visit (INDEPENDENT_AMBULATORY_CARE_PROVIDER_SITE_OTHER): Payer: Medicare Other

## 2016-10-18 ENCOUNTER — Encounter (INDEPENDENT_AMBULATORY_CARE_PROVIDER_SITE_OTHER): Payer: Self-pay | Admitting: Orthopaedic Surgery

## 2016-10-18 ENCOUNTER — Other Ambulatory Visit: Payer: Self-pay | Admitting: Internal Medicine

## 2016-10-18 ENCOUNTER — Ambulatory Visit (INDEPENDENT_AMBULATORY_CARE_PROVIDER_SITE_OTHER): Payer: Medicare Other | Admitting: Orthopaedic Surgery

## 2016-10-18 DIAGNOSIS — Z96641 Presence of right artificial hip joint: Secondary | ICD-10-CM | POA: Diagnosis not present

## 2016-10-18 DIAGNOSIS — M25531 Pain in right wrist: Secondary | ICD-10-CM

## 2016-10-18 DIAGNOSIS — I2510 Atherosclerotic heart disease of native coronary artery without angina pectoris: Secondary | ICD-10-CM

## 2016-10-18 MED ORDER — LIDOCAINE HCL 1 % IJ SOLN
1.0000 mL | INTRAMUSCULAR | Status: AC | PRN
  Administered 2016-10-18: 1 mL

## 2016-10-18 MED ORDER — METHYLPREDNISOLONE ACETATE 40 MG/ML IJ SUSP
40.0000 mg | INTRAMUSCULAR | Status: AC | PRN
  Administered 2016-10-18: 40 mg via INTRAMUSCULAR

## 2016-10-18 NOTE — Progress Notes (Signed)
Office Visit Note   Patient: Troy Banks           Date of Birth: March 24, 1941           MRN: [id number] Visit Date: 10/18/2016              Requested by: [name], MD [address], [place] [address] PCP: [name], MD   Assessment & Plan: Visit Diagnoses:  1. History of right hip replacement   2. Pain in right wrist     Plan: He tolerated the steroid injection well and is right wrist. At this point as far as his hip schedule follow-up as needed. If he has any issues at all he'll let us know. All questions were encouraged and answered.  Follow-Up Instructions: Return if symptoms worsen or fail to improve.   Orders:  Orders Placed This Encounter  Procedures  . Trigger Point Injection  . XR HIP UNILAT W OR W/O PELVIS 2-3 VIEWS RIGHT   No orders of the defined types were placed in this encounter.     Procedures: Trigger Point Inj Date/Time: 10/18/2016 8:24 AM Performed by: Mcarthur Rossetti Authorized by: Mcarthur Rossetti   Total # of Trigger Points:  1 Location: upper extremity   Approach:  Ulnar Medications #1:  1 mL lidocaine 1 %; 40 mg methylPREDNISolone acetate 40 MG/ML     Clinical Data: No additional findings.   Subjective: Chief Complaint  Patient presents with  . postop    1 year postop right hip  . Gout    c/o right wrist gout flare up   The patient is seeing for 2 different things today. He is 1 year status post a right total hip arthroplasty direct injury approach. He does have a history of mild left hip pain. He also history of gout and has had acute gout flare up. He did take a look at his right elbow and right wrist. He is recently had a change in his gout medications by his primary care physician and has had a prednisone injection intermuscular. As far as his right hip goes he says that has no issues at all and doesn't cause him any pain or problems. HPI  Review of Systems He currently denies any chest  pain, short of breath, fever, chills, nausea, vomiting.  Objective: Vital Signs: There were no vitals taken for this visit.  Physical Exam He is alert and oriented 3 and in no acute distress. He is walking without assistive device. Ortho Exam Examination of both hips show fluid range of motion of both hips. There is only slight pain on the left hip in the groin but is only mild. Examination of his right elbow and right wrist show gouty changes over the olecranon and the ulnar aspect of the wrist but these are solid areas of gouty tophi were no areas to drain. They're deathly painful. There is no evidence infection. There is no significant redness. Specialty Comments:  No specialty comments available.  Imaging: Xr Hip Unilat W Or W/o Pelvis 2-3 Views Right  Result Date: 10/18/2016 An AP pelvis and a lateral of his right hip show well-seated implant with no complicating features. There is no evidence of loosening.    PMFS History: Patient Active Problem List   Diagnosis Date Noted  . External hemorrhoid, thrombosed 06/10/2016  . Internal hemorrhoids 06/10/2016  . Lumbar back pain 05/19/2016  . Osteoarthritis of right hip 10/27/2015  .  Status post total replacement of right hip 10/27/2015  . Chronic chest pain 09/22/2015  . Acute gouty arthritis 06/11/2015  . CAD (coronary artery disease), native coronary artery 06/11/2015  . Chronic diastolic CHF (congestive heart failure) ([place]) 06/11/2015  . H/O tissue AVR Oct 2016 06/10/2015  . PAF post CABG/AVR- Amiodarone 06/10/2015  . Dyslipidemia 06/10/2015  . S/P CABG x 4-Oct 2016 03/30/2015  . H/O NSTEMI-Oct 2016 03/27/2015  . 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 Depression 09/08/2014  . GERD (gastroesophageal reflux disease)   . Essential tremor 03/31/2014  . Iliotibial band syndrome affecting left lower leg 01/01/2014  . Spondylolisthesis at L4-L5 level 01/01/2014  . Sinus bradycardia 12/31/2013  .  Greater trochanteric bursitis of right hip 12/04/2013  . OSA on CPAP   . Diet-controlled diabetes mellitus ([facility]) 07/08/2009  . COLONIC POLYPS, HX OF 12/31/2008  . GOUT 12/22/2008  . Essential hypertension 12/22/2008  . Diverticulosis of colon 12/22/2008  . NEPHROLITHIASIS, HX OF 12/22/2008   Past Medical History:  Diagnosis Date  . Allergy   . Anxiety    PTSD  . Aortic stenosis, moderate 03/27/2015   post AVR echo Echo 11/16: Mild LVH, EF 55-60%, normal wall motion, grade 1 diastolic dysfunction, bioprosthetic AVR okay (mean gradient 18 mmHg) with trivial AI, mild LAE, atrial septal aneurysm, small effusion   . Arthritis    "qwhere" (10/29/2015)  . Carotid artery stenosis    Carotid US 11/17: 1-39% bilateral ICA stenosis. F/u prn  . Cervical stenosis of spinal canal    fusion 2006  . CHF (congestive heart failure) ([facility])    "secondry to OHS"  . COLONIC POLYPS, HX OF   . Complication of anesthesia    "takes a lot to put him under and has woken up during surgery" Difficult to wake up . PTSD; "does not metabolize RX well"  . Difficult intubation   . DIVERTICULOSIS, COLON   . Dysrhythmia    2 bouts of afib post op and at home but corrected with medication.  . Familial tremor 03/19/2014  . GERD (gastroesophageal reflux disease)    uses Zantac  . GOUT   . Heart murmur   . High cholesterol   . HYPERTENSION    off ACEI 2010 because of hyperkalemia in setting of ARI  . Hypothyroidism    newly diaganosed 09/2015  . Kidney stones    remote open stone-retrieval on L  . Myocardial infarction [facility])    October 20th 2016  . OSA on CPAP   . 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 Sleep apnea    On a CPAP  . Type II diabetes mellitus ([facility])    diet controlled (10/28/2015)    Family History  Problem Relation Age of Onset  . Heart disease Mother   . Hypertension Mother   . Diabetes Mother   . Heart disease Father     . Hypertension Father   . Prostate cancer Maternal Uncle   . Hypertension Maternal Grandmother   . Heart disease Maternal Grandmother   . Hypertension Maternal Grandfather   . Heart disease Maternal Grandfather   . Hypertension Paternal Grandfather   . Heart disease Paternal Grandfather   . Diabetes Paternal Grandfather   . Alcohol abuse Other   . Arthritis Other   . Colon polyps Neg Hx   . Esophageal cancer Neg Hx   . Rectal  cancer Neg Hx   . Stomach cancer Neg Hx     Past Surgical History:  Procedure Laterality Date  . ANTERIOR FUSION CERVICAL SPINE  2002  . AORTIC VALVE REPLACEMENT N/A 03/30/2015   Procedure: AORTIC VALVE REPLACEMENT (AVR);  Surgeon: Grace Isaac, MD;  Location: [facility];  Service: Open Heart Surgery;  Laterality: N/A;  . BACK SURGERY    . CARDIAC CATHETERIZATION N/A 03/26/2015   Procedure: Left Heart Cath and Coronary Angiography;  Surgeon: Leonie Man, MD;  Location: [facility] LAB;  Service: Cardiovascular;  Laterality: N/A;  . CARDIAC VALVE REPLACEMENT    . CORONARY ARTERY BYPASS GRAFT N/A 03/30/2015   Procedure: CORONARY ARTERY BYPASS GRAFTING (CABG) x four, using left internal mammary artery and left    leg greater saphenous vein harvested endoscopically ;  Surgeon: Grace Isaac, MD;  Location: [facility];  Service: Open Heart Surgery;  Laterality: N/A;  . HEMORRHOID BANDING  1970s  . INGUINAL HERNIA REPAIR Left 1962  . JOINT REPLACEMENT    . [place]   "cut me open"  . POSTERIOR FUSION CERVICAL SPINE  2006  . TEE WITHOUT CARDIOVERSION N/A 03/30/2015   Procedure: TRANSESOPHAGEAL ECHOCARDIOGRAM (TEE);  Surgeon: Grace Isaac, MD;  Location: [facility];  Service: Open Heart Surgery;  Laterality: N/A;  . TESTICLE TORSION REDUCTION Right 1960  . TOE FUSION Left 1985 & 2004   "great toe"  . TONSILLECTOMY    . TOTAL HIP ARTHROPLASTY Right 10/27/2015   Procedure: RIGHT TOTAL HIP ARTHROPLASTY ANTERIOR APPROACH and steroid injection  right foot;  Surgeon: Mcarthur Rossetti, MD;  Location: [facility];  Service: Orthopedics;  Laterality: Right;   Social History   Occupational History  . Retired Animal nutritionist    Social History Main Topics  . Smoking status: Former Smoker    Packs/day: 1.00    Years: 34.00    Types: Cigarettes    Quit date: 06/06/1990  . Smokeless tobacco: Never Used     Comment: widowed 1991 - lives with SO [name] who is Therapist, sports  . Alcohol use 0.6 oz/week    1 Glasses of wine per week     Comment: rare  . Drug use: No  . Sexual activity: Yes

## 2016-10-20 ENCOUNTER — Telehealth: Payer: Self-pay | Admitting: Internal Medicine

## 2016-10-20 NOTE — Telephone Encounter (Signed)
Ok to stop the colchicine, though I am unaware of any relation to the [id number] intolerance issue and colchicine.  The symptoms could be related to some other issue, as they are sort of general symptoms. May need to consider ROV at some point

## 2016-10-20 NOTE — Telephone Encounter (Signed)
Pt wife called in and said that she would like nurse to call her back about pt med.  She stopped on of his meds and she needs to explain why   Best number [phone]

## 2016-10-20 NOTE — Telephone Encounter (Signed)
Pt wife stated that she had stopped the colchicine because pt was experiencing muscle weakness, fatigue and bruising. She mentioned that he has a CYPT440 intolerance and wasn't sure if these issues are related because the last time he had an episode like this was when they found out about the intolerance. Please advise.

## 2016-10-21 NOTE — Telephone Encounter (Signed)
Pt wife called back and notified and states pt has appt on 05/23

## 2016-10-21 NOTE — Telephone Encounter (Signed)
LMTCB

## 2016-10-22 ENCOUNTER — Other Ambulatory Visit: Payer: Self-pay | Admitting: Internal Medicine

## 2016-10-26 ENCOUNTER — Encounter: Payer: Self-pay | Admitting: Internal Medicine

## 2016-10-26 ENCOUNTER — Telehealth: Payer: Self-pay | Admitting: Cardiology

## 2016-10-26 ENCOUNTER — Telehealth: Payer: Self-pay | Admitting: Internal Medicine

## 2016-10-26 ENCOUNTER — Inpatient Hospital Stay: Admission: RE | Admit: 2016-10-26 | Payer: Medicare Other | Source: Ambulatory Visit

## 2016-10-26 ENCOUNTER — Ambulatory Visit (INDEPENDENT_AMBULATORY_CARE_PROVIDER_SITE_OTHER): Payer: Medicare Other | Admitting: Internal Medicine

## 2016-10-26 ENCOUNTER — Other Ambulatory Visit (INDEPENDENT_AMBULATORY_CARE_PROVIDER_SITE_OTHER): Payer: Medicare Other

## 2016-10-26 VITALS — BP 138/84 | HR 68 | Ht 68.0 in | Wt 207.0 lb

## 2016-10-26 DIAGNOSIS — N200 Calculus of kidney: Secondary | ICD-10-CM

## 2016-10-26 DIAGNOSIS — E119 Type 2 diabetes mellitus without complications: Secondary | ICD-10-CM

## 2016-10-26 DIAGNOSIS — I10 Essential (primary) hypertension: Secondary | ICD-10-CM

## 2016-10-26 DIAGNOSIS — N320 Bladder-neck obstruction: Secondary | ICD-10-CM

## 2016-10-26 DIAGNOSIS — M109 Gout, unspecified: Secondary | ICD-10-CM

## 2016-10-26 DIAGNOSIS — I2510 Atherosclerotic heart disease of native coronary artery without angina pectoris: Secondary | ICD-10-CM

## 2016-10-26 DIAGNOSIS — R310 Gross hematuria: Secondary | ICD-10-CM

## 2016-10-26 LAB — BASIC METABOLIC PANEL
BUN: 18 mg/dL (ref 6–23)
CALCIUM: 9.9 mg/dL (ref 8.4–10.5)
CHLORIDE: 107 meq/L (ref 96–112)
CO2: 28 mEq/L (ref 19–32)
CREATININE: 1.11 mg/dL (ref 0.40–1.50)
GFR: 68.86 mL/min (ref >60.00)
Glucose, Bld: 116 mg/dL — ABNORMAL HIGH (ref 70–99)
Potassium: 4.7 mEq/L (ref 3.5–5.1)
Sodium: 146 mEq/L — ABNORMAL HIGH (ref 135–145)

## 2016-10-26 LAB — CBC WITH DIFFERENTIAL/PLATELET
BASOS PCT: 1 % (ref 0.0–3.0)
Basophils Absolute: 0.1 10*3/uL (ref 0.0–0.1)
EOS ABS: 0.1 10*3/uL (ref 0.0–0.7)
Eosinophils Relative: 2.3 % (ref 0.0–5.0)
HEMATOCRIT: 42.6 % (ref 39.0–52.0)
HEMOGLOBIN: 14.1 g/dL (ref 13.0–17.0)
LYMPHS PCT: 27.8 % (ref 12.0–46.0)
Lymphs Abs: 1.6 10*3/uL (ref 0.7–4.0)
MCHC: 33 g/dL (ref 30.0–36.0)
MCV: 90 fl (ref 78.0–100.0)
MONOS PCT: 12.1 % — AB (ref 3.0–12.0)
Monocytes Absolute: 0.7 10*3/uL (ref 0.1–1.0)
Neutro Abs: 3.3 10*3/uL (ref 1.4–7.7)
Neutrophils Relative %: 56.8 % (ref 43.0–77.0)
Platelets: 129 10*3/uL — ABNORMAL LOW (ref 150.0–400.0)
RBC: 4.74 Mil/uL (ref 4.22–5.81)
RDW: 15.5 % (ref 11.5–15.5)
WBC: 5.8 10*3/uL (ref 4.0–10.5)

## 2016-10-26 LAB — URINALYSIS, ROUTINE W REFLEX MICROSCOPIC
BILIRUBIN URINE: NEGATIVE
KETONES UR: NEGATIVE
LEUKOCYTES UA: NEGATIVE
NITRITE: NEGATIVE
PH: 6 (ref 5.0–8.0)
Specific Gravity, Urine: <=1.005 — AB (ref 1.000–1.030)
Total Protein, Urine: NEGATIVE
UROBILINOGEN UA: 0.2 (ref 0.0–1.0)
Urine Glucose: NEGATIVE

## 2016-10-26 LAB — LIPID PANEL
Cholesterol: 126 mg/dL (ref 0–200)
HDL: 40.5 mg/dL (ref >39.00)
LDL Cholesterol: 68 mg/dL (ref 0–99)
NONHDL: 85.64
TRIGLYCERIDES: 89 mg/dL (ref 0.0–149.0)
Total CHOL/HDL Ratio: 3
VLDL: 17.8 mg/dL (ref 0.0–40.0)

## 2016-10-26 LAB — MICROALBUMIN / CREATININE URINE RATIO
Creatinine,U: 18.7 mg/dL
MICROALB/CREAT RATIO: 8 mg/g (ref 0.0–30.0)
Microalb, Ur: 1.5 mg/dL (ref 0.0–1.9)

## 2016-10-26 LAB — HEPATIC FUNCTION PANEL
ALT: 17 U/L (ref 0–53)
AST: 14 U/L (ref 0–37)
Albumin: 4.6 g/dL (ref 3.5–5.2)
Alkaline Phosphatase: 42 U/L (ref 39–117)
BILIRUBIN DIRECT: 0.1 mg/dL (ref 0.0–0.3)
Total Bilirubin: 0.6 mg/dL (ref 0.2–1.2)
Total Protein: 6.8 g/dL (ref 6.0–8.3)

## 2016-10-26 LAB — TSH: TSH: 2.96 u[IU]/mL (ref 0.35–4.50)

## 2016-10-26 LAB — PSA: PSA: 1.48 ng/mL (ref 0.10–4.00)

## 2016-10-26 LAB — HEMOGLOBIN A1C: Hgb A1c MFr Bld: 5.9 % (ref 4.6–6.5)

## 2016-10-26 MED ORDER — TAMSULOSIN HCL 0.4 MG PO CAPS: 0.4000 mg | ORAL_CAPSULE | Freq: Every day | ORAL | 3 refills | Status: DC

## 2016-10-26 NOTE — Assessment & Plan Note (Addendum)
Long hx of recurrent stones, also for renal f/u, flomax for current symptoms, declines any type of pain medication, and stat CT for r/o stone  Note:  Total time for pt hx, exam, review of record with pt in the room, determination of diagnoses and plan for further eval and tx is > 40 min, with over 50% spent in coordination and counseling of patient, regarding differential dx and evalautoin and management of gross hematuria, renal stones, DM, HTN, and colchicine toxicity

## 2016-10-26 NOTE — Assessment & Plan Note (Signed)
High suspicion for renal stone but cant r/o other, ok for urine cx, refer urology

## 2016-10-26 NOTE — Assessment & Plan Note (Signed)
stable overall by history and exam, recent data reviewed with pt, and pt to continue medical treatment as before,  to f/u any worsening symptoms or concerns BP Readings from Last 3 Encounters:  10/26/16 138/84  10/17/16 (!) 152/80  09/16/16 140/69

## 2016-10-26 NOTE — Progress Notes (Signed)
10/27/2016 Troy Banks   1940/07/06  [id number]  Primary Physician [name], MD Primary Cardiologist: Dr [name]  HPI:  76 y.o. Male seen for evaluation of chest pain. He was admitted 10/19-10/31/16 with a non-STEMI. LHC demonstrated 3 vessel CAD. He also had significant aortic stenosis.  He underwent CABG with LIMA-LAD, SVG-intermediate, SVG-LCx, SVG-distal RCA and bioprosthetic AVR. Atenolol was initiated but later stopped secondary to bradycardia. He did develop atrial fibrillation. He was placed on amiodarone with return to NSR. He has maintained NSR and has recovered well. He has an essential tremor and is on Primidone chronically. 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 Amiodarone was stopped in January 2017 at the time he was hospitalized for an acute gout attack in his [place]. He has maintained NSR.  She is a retired Quarry manager. He has a daughter who is a family physician in [place].  On follow up today he complains of increased dyspnea on exertion over the past 1-2 months. He notes this after walking 20-30 minutes, playing with his dog for 10-15 minutes, or walking fast. He does have associated chest tightness. 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 He continues to have significant sternal incisional pain and tenderness. He did have a gout flair recently. Was on colchicine but this caused a reaction and was stopped one week ago. He does have a kidney stone. BP has been controlled at home.   Current Outpatient Prescriptions  Medication Sig Dispense Refill  . allopurinol (ZYLOPRIM) 300 MG tablet Take 1 tablet (300 mg total) by mouth daily. 90 tablet 3  . amLODipine (NORVASC) 10 MG tablet TAKE 1 TABLET BY MOUTH EVERY DAY 90 tablet 0  . Ascorbic Acid (VITAMIN C) 1000 MG tablet Take 1,000 mg by mouth daily.    [name] aspirin 81 MG tablet Take 81 mg by mouth daily.    .  blood glucose meter kit and supplies KIT Use monitor to check blood sugar twice a day. DX E11.09 1 each 0  . Blood Glucose Monitoring Suppl (ONE TOUCH ULTRA 2) W/DEVICE KIT Test up to two times daily 1 each 1  . Cholecalciferol (VITAMIN D3) 2000 UNITS TABS Take 2,000 Units by mouth daily.    . cyanocobalamin 2000 MCG tablet Take 2,000 mcg by mouth daily.    . furosemide (LASIX) 40 MG tablet TAKE 1 TABLET BY MOUTH EVERY OTHER DAY 45 tablet 3  . glucose blood (ONE TOUCH TEST STRIPS) test strip Test up to two times daily 100 each 12  . ibuprofen (ADVIL,MOTRIN) 200 MG tablet Take 200 mg by mouth every 6 (six) hours as needed.    [name] levothyroxine (SYNTHROID, LEVOTHROID) 25 MCG tablet TAKE 1 TABLET BY MOUTH EVERY DAY BEFORE BREAKFAST 90 tablet 0  . Misc Natural Products (CURCUMAX PRO) TABS Take 1 tablet by mouth 2 (two) times daily.    . ONE TOUCH LANCETS MISC Test up to two times daily 100 each 11  . OVER THE COUNTER MEDICATION Take 1 capsule by mouth 2 (two) times daily. Tumeric 559m    . primidone (MYSOLINE) 50 MG tablet Take 2 tablets (100 mg total) by mouth 2 (two) times daily. 360 tablet 1  . ranitidine (ZANTAC) 150 MG tablet TAKE 1 TABLET BY MOUTH 2 TIMES DAILY 180 tablet 1  . rosuvastatin (CRESTOR) 10 MG tablet Take 0.5 tablets (5 mg total) by mouth  daily. 30 tablet 6  . tiZANidine (ZANAFLEX) 4 MG tablet Take 1 tablet by mouth every 6 hours as needed for muscle spasms. 40 tablet 0   No current facility-administered medications for this visit.     Allergies  Allergen Reactions  . Benzodiazepines Other (See Comments)    Patient states he gets stroke symptoms with benzo's.   . Coreg [Carvedilol] Other (See Comments)    Patient poor metabolizer of CYP2D6 - Coreg undergoes extensive hepatic (including 2D6)  . Diazepam Other (See Comments)    Stroke sx  . Lisinopril Other (See Comments)    Pt states C450, and hyperkalemia  . Mobic [Meloxicam] Other (See Comments)    Unspecified  .  Sertraline Hcl Other (See Comments)    Pt states all meds that are broken down with C-450- if so have sxs of stroke  . Tramadol Other (See Comments)    Unable to take C450  . Atorvastatin Other (See Comments)    MYALGIAS  . Colchicine Other (See Comments)    ataxia  . Compazine [Prochlorperazine Maleate] Other (See Comments)    O841  . Flomax [Tamsulosin Hcl]   . Ondansetron Other (See Comments)    C450  . Promethazine Other (See Comments)    C450  . Acyclovir And Related Other (See Comments)    Social History   Social History  . Marital status: Significant Other    Spouse name: Karmen Bongo, RN  . Number of children: 2  . Years of education: N/A   Occupational History  . Retired Animal nutritionist    Social History Main Topics  . Smoking status: Former Smoker    Packs/day: 1.00    Years: 34.00    Types: Cigarettes    Quit date: 06/06/1990  . Smokeless tobacco: Never Used     Comment: widowed 1991 - lives with SO [name] who is Therapist, sports  . Alcohol use 0.6 oz/week    1 Glasses of wine per week     Comment: rare  . Drug use: No  . Sexual activity: Yes   Other Topics Concern  . Not on file   Social History Narrative  . No narrative on file     Review of Systems: As noted in HPI All other systems reviewed and are otherwise negative except as noted above.    Blood pressure (!) 184/88, pulse (!) 56, height 5' 8"  (1.727 m), weight 206 lb (93.4 kg).  GENERAL:  Well appearing HEENT:  PERRL, EOMI, sclera are clear. Oropharynx is clear. NECK:  No jugular venous distention, carotid upstroke brisk with loud left carotid bruit, no thyromegaly or adenopathy LUNGS:  Clear to auscultation bilaterally CHEST:  Unremarkable HEART:  RRR,  PMI not displaced or sustained,S1 and S2 within normal limits, no S3, no S4: no clicks, no rubs, no murmurs. Sternal incision healed well with keloid. It is tender. ABD:  Soft, nontender. BS +, no masses or bruits. No hepatomegaly, no  splenomegaly EXT:  2 + pulses throughout, no edema, no cyanosis no clubbing SKIN:  Warm and dry.  No rashes NEURO:  Alert and oriented x 3. Cranial nerves II through XII intact. PSYCH:  Cognitively intact  Lab Results  Component Value Date   WBC 5.8 10/26/2016   HGB 14.1 10/26/2016   HCT 42.6 10/26/2016   PLT 129.0 (L) 10/26/2016   GLUCOSE 116 (H) 10/26/2016   CHOL 126 10/26/2016   TRIG 89.0 10/26/2016   HDL 40.50 10/26/2016   LDLDIRECT  100.0 09/08/2014   LDLCALC 68 10/26/2016   ALT 17 10/26/2016   AST 14 10/26/2016   NA 146 (H) 10/26/2016   K 4.7 10/26/2016   CL 107 10/26/2016   CREATININE 1.11 10/26/2016   BUN 18 10/26/2016   CO2 28 10/26/2016   TSH 2.96 10/26/2016   PSA 1.48 10/26/2016   INR 1.47 03/30/2015   HGBA1C 5.9 10/26/2016   MICROALBUR 1.5 10/26/2016     EKG- today. NSR with first degree AV block. Otherwise normal. I have personally reviewed and interpreted this study.   Echo 04/24/15: Study Conclusions  - Left ventricle: The cavity size was normal. Wall thickness was   increased in a pattern of mild LVH. Systolic function was normal.   The estimated ejection fraction was in the range of 55% to 60%.   Wall motion was normal; there were no regional wall motion   abnormalities. Doppler parameters are consistent with abnormal   left ventricular relaxation (grade 1 diastolic dysfunction). - Aortic valve: A bioprosthesis was present. There was trivial   regurgitation. - Left atrium: The atrium was mildly dilated. - Atrial septum: There was an atrial septal aneurysm. - Pericardium, extracardiac: A small pericardial effusion was   identified.  Impressions:  - Normal LV systolic function; grade 1 diastolic dysfunction; mild   LVH; s/p AVR with trace AI and mean gradient of 18 mmHg; mild   LAE; trace MR.  ASSESSMENT AND PLAN:  1. Dyspnea on exertion. Normal cardiac exam and Ecg. Echo following CABG and AVR was normal. Will check CXR. will also schedule  for stress Myoview.  2. CAD s/p CABG in October 2016 following NSTEMI.   Not a candidate for beta blocker due to brady. Continue statin. 3. S/p bioprosthetic AVR for aortic stenosis. Reminded him of routine SBE prophylaxis. Normal valve sound on exam 3. Afib. Resolved. 4. Hyperlipidemia. Satisfactory on Crestor 5 mg daily 5. HTN. Elevated today but has been controlled at home. Suspect component of white coat HTN.  6. Sternal scar keloid and pain. Discussed mechanism of this. Will check CXR to make sure there are no loose wires. Discussed referral to Plastic surgery versus PT evaluation for desensitization therapy for his pain. He would like to try PT first.    [name] MD,FACC 10/27/2016 8:34 AM

## 2016-10-26 NOTE — Assessment & Plan Note (Signed)
Asympt, cont to work on diet, wt loss, for a1c with labs Lab Results  Component Value Date   HGBA1C 6.2 (H) 10/21/2015

## 2016-10-26 NOTE — Telephone Encounter (Signed)
States pharmacist said patient should not take flomax.  Is requesting call back in regard.

## 2016-10-26 NOTE — Patient Instructions (Addendum)
Please take all new medication as prescribed - the flomax  Please continue all other medications as before, and refills have been done if requested.  Please have the pharmacy call with any other refills you may need.  Please continue your efforts at being more active, low cholesterol diet, and weight control.  You are otherwise up to date with prevention measures today.  Please keep your appointments with your specialists as you may have planned  You will be contacted regarding the referral for: CT scan  You will be contacted regarding the referral for: urology  Please go to the LAB in the Basement (turn left off the elevator) for the tests to be done today  You will be contacted by phone if any changes need to be made immediately.  Otherwise, you will receive a letter about your results with an explanation, but please check with MyChart first.  Please remember to sign up for MyChart if you have not done so, as this will be important to you in the future with finding out test results, communicating by private email, and scheduling acute appointments online when needed.  Please return in 1 year for your yearly visit, or sooner if needed

## 2016-10-26 NOTE — Telephone Encounter (Signed)
I have nothing else to offer at this time, thanks

## 2016-10-26 NOTE — Telephone Encounter (Signed)
Karmen Bongo calling, states that she is returning a missed call, thanks.

## 2016-10-26 NOTE — Telephone Encounter (Signed)
Returned call to patient's wife.She stated husband has been having chest pain off and on for the past 3 to 4 weeks.He feels tired,sob.Appointment scheduled with Dr.Jordan tomorrow 10/27/16 at 8:00 am.Advised to go to ED if needed.

## 2016-10-26 NOTE — Assessment & Plan Note (Signed)
Asympt, for psa as he is due 

## 2016-10-26 NOTE — Telephone Encounter (Signed)
Spoke with [name] and she stated that the pharmacist had also looked up other options but the pt would not be able to take those either due to the interaction with the his condition.

## 2016-10-26 NOTE — Progress Notes (Signed)
Subjective:    Patient ID: Troy Banks, male    DOB: 1940-10-15, 76 y.o.   MRN: [id number]  HPI   Here for yearly f/u;  Overall doing ok;  Pt denies Chest pain, worsening SOB, DOE, wheezing, orthopnea, PND, worsening LE edema, palpitations, dizziness or syncope.  Pt denies neurological change such as new headache, facial or extremity weakness.  Pt denies polydipsia, polyuria, or low sugar symptoms. Pt states overall good compliance with treatment and medications, good tolerability, and has been trying to follow appropriate diet.  Pt denies worsening depressive symptoms, suicidal ideation or panic. No fever, night sweats, wt loss, loss of appetite, or other constitutional symptoms.  Pt states good ability with ADL's, has low fall risk except for recent with colchicine, home safety reviewed and adequate, no other significant changes in hearing or vision, and occasionally active with exercise with walking.  Mentions "Brain fog" seemed some worse this wk even after stopping the colchicine, except for the last 2 days when he has felt much better..  Pt wife states weakness, ataxic, fatigued, couldn't stay awake, similar to when he had valium but not drooling this time.  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  Has cataract right eye but not yet mature per [place] exam last wk.   Also incidentally has right side flank pain/abd pain with gross hematuria off and on for 1 mo, has long hx of renal stones, s/p surgical removal at one point, asks for urology consult,. Also sees cardiology group - Dr Wendie Simmer, and Dr Danis/GI.    Wife states he literally cannot take anhy pain med of any kind due to liver issue, and she is RN Past Medical History:  Diagnosis Date  . Allergy   . Anxiety    PTSD  . Aortic stenosis, moderate 03/27/2015   post AVR echo Echo 11/16: Mild LVH, EF 55-60%, normal wall motion, grade 1 diastolic dysfunction,  bioprosthetic AVR okay (mean gradient 18 mmHg) with trivial AI, mild LAE, atrial septal aneurysm, small effusion   . Arthritis    "qwhere" (10/29/2015)  . Carotid artery stenosis    Carotid US 11/17: 1-39% bilateral ICA stenosis. F/u prn  . Cervical stenosis of spinal canal    fusion 2006  . CHF (congestive heart failure) ([place])    "secondry to OHS"  . COLONIC POLYPS, HX OF   . Complication of anesthesia    "takes a lot to put him under and has woken up during surgery" Difficult to wake up . PTSD; "does not metabolize RX well"  . Difficult intubation   . DIVERTICULOSIS, COLON   . Dysrhythmia    2 bouts of afib post op and at home but corrected with medication.  . Familial tremor 03/19/2014  . GERD (gastroesophageal reflux disease)    uses Zantac  . GOUT   . Heart murmur   . High cholesterol   . HYPERTENSION    off ACEI 2010 because of hyperkalemia in setting of ARI  . Hypothyroidism    newly diaganosed 09/2015  . Kidney stones    remote open stone-retrieval on L  . Myocardial infarction [facility])    October 20th 2016  . OSA on CPAP   . 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 Sleep apnea    On a CPAP  . Type II diabetes mellitus ([place])  diet controlled (10/28/2015)   Past Surgical History:  Procedure Laterality Date  . ANTERIOR FUSION CERVICAL SPINE  2002  . AORTIC VALVE REPLACEMENT N/A 03/30/2015   Procedure: AORTIC VALVE REPLACEMENT (AVR);  Surgeon: Grace Isaac, MD;  Location: [place];  Service: Open Heart Surgery;  Laterality: N/A;  . BACK SURGERY    . CARDIAC CATHETERIZATION N/A 03/26/2015   Procedure: Left Heart Cath and Coronary Angiography;  Surgeon: Leonie Man, MD;  Location: [facility];  Service: Cardiovascular;  Laterality: N/A;  . CARDIAC VALVE REPLACEMENT    . CORONARY ARTERY BYPASS GRAFT N/A 03/30/2015   Procedure: CORONARY ARTERY BYPASS GRAFTING (CABG) x four, using left internal  mammary artery and left    leg greater saphenous vein harvested endoscopically ;  Surgeon: Grace Isaac, MD;  Location: [address];  Service: Open Heart Surgery;  Laterality: N/A;  . HEMORRHOID BANDING  1970s  . INGUINAL HERNIA REPAIR Left 1962  . JOINT REPLACEMENT    . [place]   "cut me open"  . POSTERIOR FUSION CERVICAL SPINE  2006  . TEE WITHOUT CARDIOVERSION N/A 03/30/2015   Procedure: TRANSESOPHAGEAL ECHOCARDIOGRAM (TEE);  Surgeon: Grace Isaac, MD;  Location: [place];  Service: Open Heart Surgery;  Laterality: N/A;  . TESTICLE TORSION REDUCTION Right 1960  . TOE FUSION Left 1985 & 2004   "great toe"  . TONSILLECTOMY    . TOTAL HIP ARTHROPLASTY Right 10/27/2015   Procedure: RIGHT TOTAL HIP ARTHROPLASTY ANTERIOR APPROACH and steroid injection right foot;  Surgeon: Mcarthur Rossetti, MD;  Location: [place];  Service: Orthopedics;  Laterality: Right;    reports that he quit smoking about 26 years ago. His smoking use included Cigarettes. He has a 34.00 pack-year smoking history. He has never used smokeless tobacco. He reports that he drinks about 0.6 oz of alcohol per week . He reports that he does not use drugs. family history includes Alcohol abuse in his other; Arthritis in his other; Diabetes in his mother and paternal grandfather; Heart disease in his father, maternal grandfather, maternal grandmother, mother, and paternal grandfather; Hypertension in his father, maternal grandfather, maternal grandmother, mother, and paternal grandfather; Prostate cancer in his maternal uncle. Allergies  Allergen Reactions  . Benzodiazepines Other (See Comments)    Patient states he gets stroke symptoms with benzo's.   . Coreg [Carvedilol] Other (See Comments)    Patient poor metabolizer of CYP2D6 - Coreg undergoes extensive hepatic (including 2D6)  . Diazepam Other (See Comments)    Stroke sx  . Lisinopril Other (See Comments)    Pt states C450, and hyperkalemia  . Mobic  [Meloxicam] Other (See Comments)    Unspecified  . Sertraline Hcl Other (See Comments)    Pt states all meds that are broken down with C-450- if so have sxs of stroke  . Tramadol Other (See Comments)    Unable to take C450  . Atorvastatin Other (See Comments)    MYALGIAS  . Colchicine Other (See Comments)    ataxia  . Compazine [Prochlorperazine Maleate] Other (See Comments)    T016  . Ondansetron Other (See Comments)    C450  . Promethazine Other (See Comments)    C450  . Acyclovir And Related Other (See Comments)   Current Outpatient Prescriptions on File Prior to Visit  Medication Sig Dispense Refill  . allopurinol (ZYLOPRIM) 300 MG tablet Take 1 tablet (300 mg total) by mouth daily. 90 tablet 3  .  amLODipine (NORVASC) 10 MG tablet TAKE 1 TABLET BY MOUTH EVERY DAY 90 tablet 0  . Ascorbic Acid (VITAMIN C) 1000 MG tablet Take 1,000 mg by mouth daily.    [name] aspirin 81 MG tablet Take 81 mg by mouth daily.    . blood glucose meter kit and supplies KIT Use monitor to check blood sugar twice a day. DX E11.09 1 each 0  . Blood Glucose Monitoring Suppl (ONE TOUCH ULTRA 2) W/DEVICE KIT Test up to two times daily 1 each 1  . Cholecalciferol (VITAMIN D3) 2000 UNITS TABS Take 2,000 Units by mouth daily.    . cyanocobalamin 2000 MCG tablet Take 2,000 mcg by mouth daily.    . furosemide (LASIX) 40 MG tablet TAKE 1 TABLET BY MOUTH EVERY OTHER DAY 45 tablet 3  . glucose blood (ONE TOUCH TEST STRIPS) test strip Test up to two times daily 100 each 12  . ibuprofen (ADVIL,MOTRIN) 200 MG tablet Take 200 mg by mouth every 6 (six) hours as needed.    [name] levothyroxine (SYNTHROID, LEVOTHROID) 25 MCG tablet TAKE 1 TABLET BY MOUTH EVERY DAY BEFORE BREAKFAST 90 tablet 0  . Misc Natural Products (CURCUMAX PRO) TABS Take 1 tablet by mouth 2 (two) times daily.    . ONE TOUCH LANCETS MISC Test up to two times daily 100 each 11  . OVER THE COUNTER MEDICATION Take 1 capsule by mouth 2 (two) times daily. Tumeric  555m    . primidone (MYSOLINE) 50 MG tablet Take 2 tablets (100 mg total) by mouth 2 (two) times daily. 360 tablet 1  . ranitidine (ZANTAC) 150 MG tablet TAKE 1 TABLET BY MOUTH 2 TIMES DAILY 180 tablet 1  . rosuvastatin (CRESTOR) 10 MG tablet Take 0.5 tablets (5 mg total) by mouth daily. 30 tablet 6  . tiZANidine (ZANAFLEX) 4 MG tablet Take 1 tablet by mouth every 6 hours as needed for muscle spasms. 40 tablet 0   No current facility-administered medications on file prior to visit.    Review of Systems Constitutional: Negative for other unusual diaphoresis, sweats, appetite or weight changes HENT: Negative for other worsening hearing loss, ear pain, facial swelling, mouth sores or neck stiffness.   Eyes: Negative for other worsening pain, redness or other visual disturbance.  Respiratory: Negative for other stridor or swelling Cardiovascular: Negative for other palpitations or other chest pain  Gastrointestinal: Negative for worsening diarrhea or loose stools, blood in stool, distention or other pain Genitourinary: Negative for hematuria, flank pain or other change in urine volume.  Musculoskeletal: Negative for myalgias or other joint swelling.  Skin: Negative for other color change, or other wound or worsening drainage.  Neurological: Negative for other syncope or numbness. Hematological: Negative for other adenopathy or swelling Psychiatric/Behavioral: Negative for hallucinations, other worsening agitation, SI, self-injury, or new decreased concentration All other system neg per pt    Objective:   Physical Exam BP 138/84   Pulse 68   Ht 5' 8"  (1.727 m)   Wt 207 lb (93.9 kg)   SpO2 99%   BMI 31.47 kg/m  VS noted, not ill or toxic but uncomfortable appearing, points to right flank Constitutional: Pt is oriented to person, place, and time. Appears well-developed and well-nourished, in no significant distress and comfortable Head: Normocephalic and atraumatic  Eyes: Conjunctivae  and EOM are normal. Pupils are equal, round, and reactive to light Right Ear: External ear normal without discharge Left Ear: External ear normal without discharge Nose: Nose without discharge or  deformity Mouth/Throat: Oropharynx is without other ulcerations and moist  Neck: Normal range of motion. Neck supple. No JVD present. No tracheal deviation present or significant neck LA or mass Cardiovascular: Normal rate, regular rhythm, normal heart sounds and intact distal pulses.   Pulmonary/Chest: WOB normal and breath sounds without rales or wheezing  Abdominal: Soft. Bowel sounds are normal. NT. No HSM  Musculoskeletal: Normal range of motion. Exhibits no edema Lymphadenopathy: Has no other cervical adenopathy.  Neurological: Pt is alert and oriented to person, place, and time. Pt has normal reflexes. No cranial nerve deficit. Motor grossly intact, Gait intact Skin: Skin is warm and dry. No rash noted or new ulcerations Psychiatric:  Has normal mood and affect. Behavior is normal without agitation No other exam findings    Assessment & Plan:

## 2016-10-26 NOTE — Assessment & Plan Note (Signed)
No current symptoms, delcines further colchicine and placed on intolerance list

## 2016-10-27 ENCOUNTER — Ambulatory Visit (INDEPENDENT_AMBULATORY_CARE_PROVIDER_SITE_OTHER): Payer: Medicare Other | Admitting: Cardiology

## 2016-10-27 ENCOUNTER — Telehealth (HOSPITAL_COMMUNITY): Payer: Self-pay

## 2016-10-27 ENCOUNTER — Ambulatory Visit
Admission: RE | Admit: 2016-10-27 | Discharge: 2016-10-27 | Disposition: A | Discharge status: Home / Self Care | Payer: Medicare Other | Source: Ambulatory Visit | Attending: Cardiology | Admitting: Cardiology

## 2016-10-27 ENCOUNTER — Encounter: Payer: Self-pay | Admitting: Cardiology

## 2016-10-27 ENCOUNTER — Telehealth: Payer: Self-pay | Admitting: Cardiology

## 2016-10-27 VITALS — BP 184/88 | HR 56 | Ht 68.0 in | Wt 206.0 lb

## 2016-10-27 DIAGNOSIS — R0602 Shortness of breath: Secondary | ICD-10-CM | POA: Diagnosis not present

## 2016-10-27 DIAGNOSIS — L7682 Other postprocedural complications of skin and subcutaneous tissue: Secondary | ICD-10-CM

## 2016-10-27 DIAGNOSIS — R0609 Other forms of dyspnea: Secondary | ICD-10-CM

## 2016-10-27 DIAGNOSIS — E785 Hyperlipidemia, unspecified: Secondary | ICD-10-CM | POA: Diagnosis not present

## 2016-10-27 DIAGNOSIS — Z952 Presence of prosthetic heart valve: Secondary | ICD-10-CM | POA: Diagnosis not present

## 2016-10-27 DIAGNOSIS — I2510 Atherosclerotic heart disease of native coronary artery without angina pectoris: Secondary | ICD-10-CM

## 2016-10-27 DIAGNOSIS — Z951 Presence of aortocoronary bypass graft: Secondary | ICD-10-CM

## 2016-10-27 DIAGNOSIS — I10 Essential (primary) hypertension: Secondary | ICD-10-CM

## 2016-10-27 LAB — URINE CULTURE: ORGANISM ID, BACTERIA: NO GROWTH

## 2016-10-27 NOTE — Telephone Encounter (Signed)
Returned call to patient's wife chest xray results given.Wife asked if we could always call her with patient's test results.Stated husband gets very anxious.

## 2016-10-27 NOTE — Patient Instructions (Signed)
We will check a chest X ray  We will schedule you for a Nuclear stress test  We will refer you for desensitization therapy

## 2016-10-27 NOTE — Telephone Encounter (Signed)
Encounter complete. 

## 2016-10-27 NOTE — Telephone Encounter (Signed)
I did call [place]. I spoke with [place] Crane, PharmD in reference to medication possibilities with this pt during this test. Janett Billow did look up and confirm that we can use Myoview on Mr. Fusselman for his NUC MPI on Tuesday. I also had her look up Lexiscan and Aminophylline to ensure that in the event that this patient is unable to walk on the treadmill that he could use thes medications to complete his NUC MPI safely by medicated means. Nevada Crane, Pharm D did ensure that there is not any reason that this patient would not be able to use these medications in the event that he is unable to walk on the treadmill. Myoview--- OK to use Lexiscan--- OK to use Aminophylline---OK to use  Again, confirmed by Bertrum Sol D at [place].

## 2016-10-27 NOTE — Telephone Encounter (Signed)
[name], Pt girlfriend noted on DPR to receive information has alerted me that pt does have a CYP450 deficiency. I have alerted [name] that I will clear all aspects of pharmaceuticals with the pharmacy before we administer this test. I will call her back if the pharmacy states pt should not take. Otherwise we will see the pt on Tuesday morning.

## 2016-10-27 NOTE — Telephone Encounter (Signed)
New message    Pt is calling back to [place]. Pt ask that his wife be called back because he is at work.

## 2016-10-28 ENCOUNTER — Ambulatory Visit (INDEPENDENT_AMBULATORY_CARE_PROVIDER_SITE_OTHER)
Admission: RE | Admit: 2016-10-28 | Discharge: 2016-10-28 | Disposition: A | Discharge status: Home / Self Care | Payer: Medicare Other | Source: Ambulatory Visit | Attending: Internal Medicine | Admitting: Internal Medicine

## 2016-10-28 ENCOUNTER — Telehealth: Payer: Self-pay

## 2016-10-28 DIAGNOSIS — N200 Calculus of kidney: Secondary | ICD-10-CM | POA: Diagnosis not present

## 2016-10-28 DIAGNOSIS — K8020 Calculus of gallbladder without cholecystitis without obstruction: Secondary | ICD-10-CM | POA: Diagnosis not present

## 2016-10-28 NOTE — Telephone Encounter (Signed)
-----   Message from [name], MD sent at 10/28/2016 11:50 AM EDT ----- Left message on MyChart, pt to cont same tx except  The test results show that your current treatment is OK, except there is severe obstruction from a left kidney stone. Your kidney function on the blood tests were ok. But this will need to be considered by urology for any kind of treatment that may be needed.  Please keep your Urology referral appt as discussed at your last visit.  Sh[place]ron to please inform pt, and encourage to keep his urology referral appt (already done)

## 2016-10-28 NOTE — Telephone Encounter (Signed)
Spoke with the pt's wife, she was informed of results and expressed understanding.

## 2016-11-01 ENCOUNTER — Ambulatory Visit (HOSPITAL_COMMUNITY)
Admission: RE | Admit: 2016-11-01 | Discharge: 2016-11-01 | Disposition: A | Discharge status: Home / Self Care | Payer: Medicare Other | Source: Ambulatory Visit | Attending: Cardiovascular Disease | Admitting: Cardiovascular Disease

## 2016-11-01 ENCOUNTER — Other Ambulatory Visit: Payer: Self-pay

## 2016-11-01 DIAGNOSIS — L7682 Other postprocedural complications of skin and subcutaneous tissue: Secondary | ICD-10-CM | POA: Diagnosis not present

## 2016-11-01 DIAGNOSIS — Z87891 Personal history of nicotine dependence: Secondary | ICD-10-CM | POA: Diagnosis not present

## 2016-11-01 DIAGNOSIS — I2510 Atherosclerotic heart disease of native coronary artery without angina pectoris: Secondary | ICD-10-CM

## 2016-11-01 DIAGNOSIS — Z952 Presence of prosthetic heart valve: Secondary | ICD-10-CM | POA: Diagnosis not present

## 2016-11-01 DIAGNOSIS — R0609 Other forms of dyspnea: Secondary | ICD-10-CM | POA: Diagnosis not present

## 2016-11-01 DIAGNOSIS — I10 Essential (primary) hypertension: Secondary | ICD-10-CM

## 2016-11-01 DIAGNOSIS — Z8249 Family history of ischemic heart disease and other diseases of the circulatory system: Secondary | ICD-10-CM | POA: Diagnosis not present

## 2016-11-01 DIAGNOSIS — E785 Hyperlipidemia, unspecified: Secondary | ICD-10-CM | POA: Diagnosis not present

## 2016-11-01 DIAGNOSIS — Z951 Presence of aortocoronary bypass graft: Secondary | ICD-10-CM | POA: Diagnosis not present

## 2016-11-01 LAB — MYOCARDIAL PERFUSION IMAGING
CHL CUP NUCLEAR SDS: 1
CHL CUP RESTING HR STRESS: 51 {beats}/min
CSEPPHR: 58 {beats}/min
LV dias vol: 134 mL (ref 62–150)
LV sys vol: 64 mL
NUC STRESS TID: 1.02
SRS: 8
SSS: 9

## 2016-11-01 MED ORDER — TECHNETIUM TC 99M TETROFOSMIN IV KIT
10.5000 | PACK | Freq: Once | INTRAVENOUS | Status: AC | PRN
  Administered 2016-11-01: 10.5 via INTRAVENOUS
  Filled 2016-11-01: qty 11

## 2016-11-01 MED ORDER — TECHNETIUM TC 99M TETROFOSMIN IV KIT
29.8000 | PACK | Freq: Once | INTRAVENOUS | Status: AC | PRN
  Administered 2016-11-01: 29.8 via INTRAVENOUS
  Filled 2016-11-01: qty 30

## 2016-11-01 MED ORDER — REGADENOSON 0.4 MG/5ML IV SOLN
0.4000 mg | Freq: Once | INTRAVENOUS | Status: AC
  Administered 2016-11-01: 0.4 mg via INTRAVENOUS

## 2016-11-03 ENCOUNTER — Other Ambulatory Visit: Payer: Self-pay

## 2016-11-03 DIAGNOSIS — R0789 Other chest pain: Secondary | ICD-10-CM

## 2016-11-03 DIAGNOSIS — I2510 Atherosclerotic heart disease of native coronary artery without angina pectoris: Secondary | ICD-10-CM

## 2016-11-04 ENCOUNTER — Other Ambulatory Visit: Payer: Self-pay | Admitting: Urology

## 2016-11-04 ENCOUNTER — Other Ambulatory Visit: Payer: Self-pay

## 2016-11-04 ENCOUNTER — Encounter (HOSPITAL_COMMUNITY): Payer: Self-pay | Admitting: *Deleted

## 2016-11-04 DIAGNOSIS — I2510 Atherosclerotic heart disease of native coronary artery without angina pectoris: Secondary | ICD-10-CM

## 2016-11-04 DIAGNOSIS — N200 Calculus of kidney: Secondary | ICD-10-CM | POA: Diagnosis not present

## 2016-11-04 DIAGNOSIS — R0789 Other chest pain: Secondary | ICD-10-CM

## 2016-11-05 IMAGING — CR DG CHEST 1V PORT
1 series · 1 of 1 positions shown · non-contrast
Comparison: None.

CLINICAL DATA: Sudden onset of central chest pain 2 hours ago

EXAM:
PORTABLE CHEST - 1 VIEW

[AP]
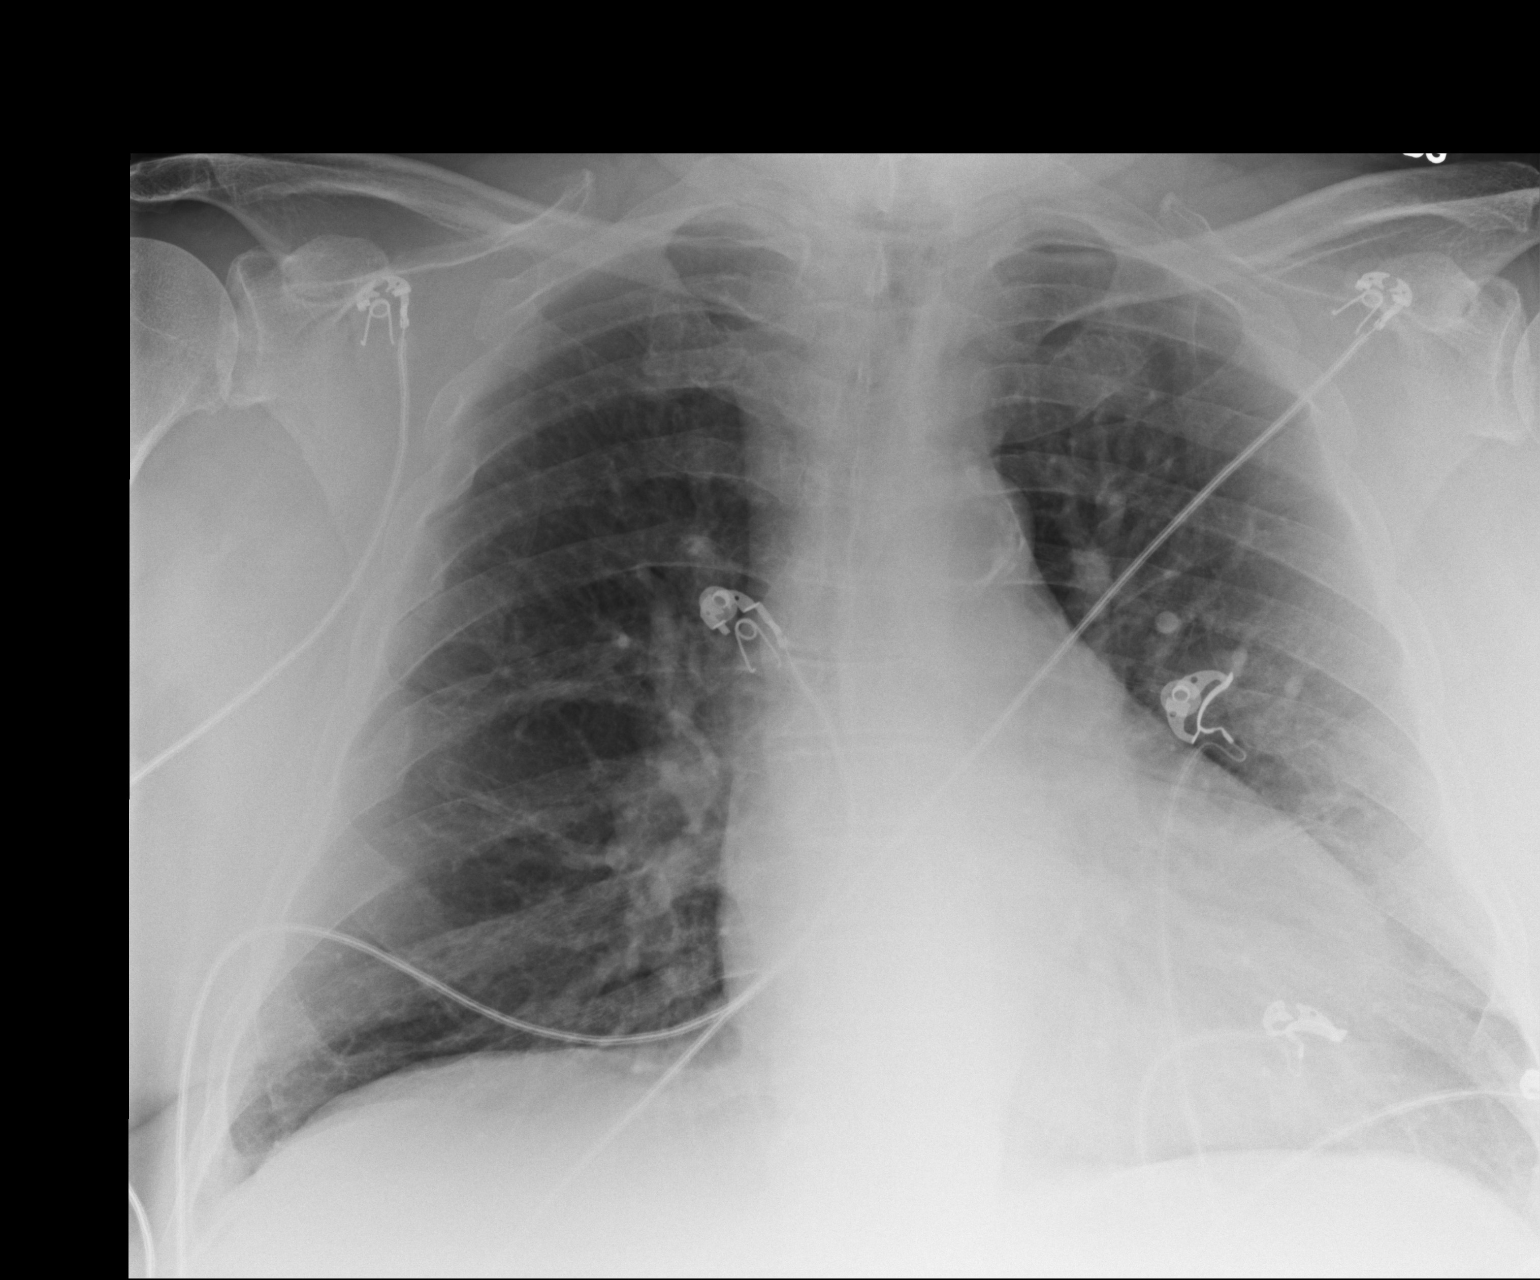

[1 of 1 positions shown; findings below may reference images not displayed]

FINDINGS: Cardiac shadow is at the upper limits of normal. The lungs are well
aerated bilaterally. Healed rib fractures are seen on the right. No
acute bony abnormality is noted.
IMPRESSION: No acute abnormality seen.

## 2016-11-07 ENCOUNTER — Encounter (HOSPITAL_COMMUNITY): Payer: Self-pay | Admitting: *Deleted

## 2016-11-07 ENCOUNTER — Ambulatory Visit (HOSPITAL_COMMUNITY): Payer: Medicare Other | Admitting: Registered Nurse

## 2016-11-07 ENCOUNTER — Ambulatory Visit (HOSPITAL_COMMUNITY)
Admission: RE | Admit: 2016-11-07 | Discharge: 2016-11-07 | Disposition: A | Discharge status: Home / Self Care | Payer: Medicare Other | Source: Ambulatory Visit | Attending: Urology | Admitting: Urology

## 2016-11-07 ENCOUNTER — Encounter (HOSPITAL_COMMUNITY)
Admission: RE | Disposition: A | Discharge status: Home / Self Care | Payer: Self-pay | Source: Ambulatory Visit | Attending: Urology

## 2016-11-07 ENCOUNTER — Ambulatory Visit (HOSPITAL_COMMUNITY): Payer: Medicare Other

## 2016-11-07 DIAGNOSIS — Z96641 Presence of right artificial hip joint: Secondary | ICD-10-CM | POA: Insufficient documentation

## 2016-11-07 DIAGNOSIS — I252 Old myocardial infarction: Secondary | ICD-10-CM | POA: Diagnosis not present

## 2016-11-07 DIAGNOSIS — N132 Hydronephrosis with renal and ureteral calculous obstruction: Secondary | ICD-10-CM | POA: Insufficient documentation

## 2016-11-07 DIAGNOSIS — G4730 Sleep apnea, unspecified: Secondary | ICD-10-CM | POA: Diagnosis not present

## 2016-11-07 DIAGNOSIS — E7800 Pure hypercholesterolemia, unspecified: Secondary | ICD-10-CM | POA: Insufficient documentation

## 2016-11-07 DIAGNOSIS — I071 Rheumatic tricuspid insufficiency: Secondary | ICD-10-CM | POA: Diagnosis not present

## 2016-11-07 DIAGNOSIS — I739 Peripheral vascular disease, unspecified: Secondary | ICD-10-CM | POA: Diagnosis not present

## 2016-11-07 DIAGNOSIS — Z951 Presence of aortocoronary bypass graft: Secondary | ICD-10-CM | POA: Insufficient documentation

## 2016-11-07 DIAGNOSIS — E039 Hypothyroidism, unspecified: Secondary | ICD-10-CM | POA: Diagnosis not present

## 2016-11-07 DIAGNOSIS — I2510 Atherosclerotic heart disease of native coronary artery without angina pectoris: Secondary | ICD-10-CM | POA: Insufficient documentation

## 2016-11-07 DIAGNOSIS — Z791 Long term (current) use of non-steroidal anti-inflammatories (NSAID): Secondary | ICD-10-CM | POA: Diagnosis not present

## 2016-11-07 DIAGNOSIS — Z981 Arthrodesis status: Secondary | ICD-10-CM | POA: Diagnosis not present

## 2016-11-07 DIAGNOSIS — I110 Hypertensive heart disease with heart failure: Secondary | ICD-10-CM | POA: Insufficient documentation

## 2016-11-07 DIAGNOSIS — Z87891 Personal history of nicotine dependence: Secondary | ICD-10-CM | POA: Insufficient documentation

## 2016-11-07 DIAGNOSIS — I509 Heart failure, unspecified: Secondary | ICD-10-CM | POA: Diagnosis not present

## 2016-11-07 DIAGNOSIS — I5032 Chronic diastolic (congestive) heart failure: Secondary | ICD-10-CM | POA: Diagnosis not present

## 2016-11-07 DIAGNOSIS — Z79899 Other long term (current) drug therapy: Secondary | ICD-10-CM | POA: Diagnosis not present

## 2016-11-07 DIAGNOSIS — M109 Gout, unspecified: Secondary | ICD-10-CM | POA: Insufficient documentation

## 2016-11-07 DIAGNOSIS — N200 Calculus of kidney: Secondary | ICD-10-CM | POA: Diagnosis not present

## 2016-11-07 DIAGNOSIS — N202 Calculus of kidney with calculus of ureter: Secondary | ICD-10-CM | POA: Diagnosis not present

## 2016-11-07 DIAGNOSIS — Z466 Encounter for fitting and adjustment of urinary device: Secondary | ICD-10-CM | POA: Diagnosis not present

## 2016-11-07 HISTORY — DX: Post-traumatic stress disorder, unspecified: F43.10

## 2016-11-07 HISTORY — DX: Personal history of urinary calculi: Z87.442

## 2016-11-07 HISTORY — PX: CYSTOSCOPY/URETEROSCOPY/HOLMIUM LASER/STENT PLACEMENT: SHX6546

## 2016-11-07 LAB — GLUCOSE, CAPILLARY
GLUCOSE-CAPILLARY: 106 mg/dL — AB (ref 65–99)
Glucose-Capillary: 101 mg/dL — ABNORMAL HIGH (ref 65–99)

## 2016-11-07 SURGERY — CYSTOSCOPY/URETEROSCOPY/HOLMIUM LASER/STENT PLACEMENT
Anesthesia: General | Site: Ureter | Laterality: Bilateral

## 2016-11-07 MED ORDER — PROPOFOL 10 MG/ML IV BOLUS
INTRAVENOUS | Status: DC | PRN
  Administered 2016-11-07: 50 mg via INTRAVENOUS
  Administered 2016-11-07: 150 mg via INTRAVENOUS

## 2016-11-07 MED ORDER — LACTATED RINGERS IV SOLN
INTRAVENOUS | Status: DC
  Administered 2016-11-07 (×2): via INTRAVENOUS

## 2016-11-07 MED ORDER — FENTANYL CITRATE (PF) 100 MCG/2ML IJ SOLN
INTRAMUSCULAR | Status: DC | PRN
  Administered 2016-11-07 (×2): 25 ug via INTRAVENOUS

## 2016-11-07 MED ORDER — 0.9 % SODIUM CHLORIDE (POUR BTL) OPTIME
TOPICAL | Status: DC | PRN
  Administered 2016-11-07: 1000 mL

## 2016-11-07 MED ORDER — FENTANYL CITRATE (PF) 100 MCG/2ML IJ SOLN
INTRAMUSCULAR | Status: AC
  Filled 2016-11-07: qty 2

## 2016-11-07 MED ORDER — CEFAZOLIN SODIUM-DEXTROSE 2-4 GM/100ML-% IV SOLN
INTRAVENOUS | Status: AC
  Filled 2016-11-07: qty 100

## 2016-11-07 MED ORDER — MIDAZOLAM HCL 2 MG/2ML IJ SOLN
INTRAMUSCULAR | Status: AC
  Filled 2016-11-07: qty 2

## 2016-11-07 MED ORDER — CEPHALEXIN 500 MG PO CAPS: 500.0000 mg | ORAL_CAPSULE | Freq: Four times a day (QID) | ORAL | 0 refills | Status: DC

## 2016-11-07 MED ORDER — SODIUM CHLORIDE 0.9 % IR SOLN
Status: DC | PRN
  Administered 2016-11-07: 4000 mL

## 2016-11-07 MED ORDER — SODIUM CHLORIDE 0.9 % IV SOLN
INTRAVENOUS | Status: DC | PRN
  Administered 2016-11-07: 40 mL

## 2016-11-07 MED ORDER — CEFAZOLIN SODIUM-DEXTROSE 2-4 GM/100ML-% IV SOLN
2.0000 g | INTRAVENOUS | Status: AC
  Administered 2016-11-07: 2 g via INTRAVENOUS

## 2016-11-07 SURGICAL SUPPLY — 20 items
BAG URO CATCHER STRL LF (MISCELLANEOUS) ×2 IMPLANT
BASKET ZERO TIP NITINOL 2.4FR (BASKET) ×1 IMPLANT
BSKT STON RTRVL ZERO TP 2.4FR (BASKET) ×1
CATH URET 5FR 28IN OPEN ENDED (CATHETERS) ×1 IMPLANT
CATH URET DUAL LUMEN 6-10FR 50 (CATHETERS) ×2 IMPLANT
CLOTH BEACON ORANGE TIMEOUT ST (SAFETY) ×2 IMPLANT
COVER SURGICAL LIGHT HANDLE (MISCELLANEOUS) ×2 IMPLANT
FIBER LASER TRAC TIP (UROLOGICAL SUPPLIES) ×1 IMPLANT
GLOVE BIO SURGEON STRL SZ7.5 (GLOVE) ×2 IMPLANT
GLOVE BIOGEL PI IND STRL 7.0 (GLOVE) IMPLANT
GLOVE BIOGEL PI INDICATOR 7.0 (GLOVE) ×2
GOWN STRL REUS W/TWL LRG LVL3 (GOWN DISPOSABLE) ×3 IMPLANT
GUIDEWIRE ANG ZIPWIRE 038X150 (WIRE) IMPLANT
GUIDEWIRE STR DUAL SENSOR (WIRE) ×4 IMPLANT
MANIFOLD NEPTUNE II (INSTRUMENTS) ×2 IMPLANT
PACK CYSTO (CUSTOM PROCEDURE TRAY) ×2 IMPLANT
SHEATH ACCESS URETERAL 38CM (SHEATH) ×1 IMPLANT
STENT URET 6FRX26 CONTOUR (STENTS) ×2 IMPLANT
TUBING CONNECTING 10 (TUBING) ×2 IMPLANT
WIRE COONS/BENSON .038X145CM (WIRE) ×1 IMPLANT

## 2016-11-07 NOTE — Interval H&P Note (Signed)
History and Physical Interval Note:  11/07/2016 2:41 PM  Troy Banks  has presented today for surgery, with the diagnosis of BILATERAL STONES  The various methods of treatment have been discussed with the patient and family. After consideration of risks, benefits and other options for treatment, the patient has consented to  Procedure(s): CYSTOSCOPY/URETEROSCOPY/HOLMIUM LASER/STENT PLACEMENT (Bilateral) as a surgical intervention .  The patient's history has been reviewed, patient examined, no change in status, stable for surgery.  I have reviewed the patient's chart and labs.  Questions were answered to the patient's satisfaction.     Nickie Retort

## 2016-11-07 NOTE — Anesthesia Preprocedure Evaluation (Addendum)
Anesthesia Evaluation  Patient identified by MRN, date of birth, ID band Patient awake    Reviewed: Allergy & Precautions, H&P , NPO status , Patient's Chart, lab work & pertinent test results  History of Anesthesia Complications Negative for: history of anesthetic complications  Airway Mallampati: II  TM Distance: >3 FB Neck ROM: full   Comment: C spine surgery and previous Glidescope intubation Dental no notable dental hx. (+) Dental Advisory Given, Poor Dentition, Chipped   Pulmonary neg pulmonary ROS, sleep apnea , former smoker,    Pulmonary exam normal breath sounds clear to auscultation       Cardiovascular hypertension, + angina + CAD, + Past MI, + CABG, + Peripheral Vascular Disease and +CHF  Normal cardiovascular exam+ dysrhythmias + Valvular Problems/Murmurs MR and AS  Rhythm:regular Rate:Normal - Systolic murmurs CABG and AV in 2016  Echo 2016 - Left ventricle: The cavity size was normal. Wall thickness was increased in a pattern of mild LVH. Systolic function was normal. The estimated ejection fraction was in the range of 55% to 60%. Wall motion was normal; there were no regional wall motion abnormalities. Doppler parameters are consistent with abnormal left ventricular relaxation (grade 1 diastolic dysfunction). - Aortic valve: A bioprosthesis was present. There was trivial regurgitation. - Left atrium: The atrium was mildly dilated. - Atrial septum: There was an atrial septal aneurysm. - Pericardium, extracardiac: A small pericardial effusion was identified.  Impressions: - Normal LV systolic function; grade 1 diastolic dysfunction; mild  LVH; s/p AVR with trace AI and mean gradient of 18 mmHg; mild LAE; trace MR.   Neuro/Psych Depression PTSDnegative neurological ROS     GI/Hepatic negative GI ROS, Neg liver ROS, GERD  ,  Endo/Other  diabetesHypothyroidism   Renal/GU Renal disease     Musculoskeletal   Abdominal   Peds  Hematology negative hematology ROS (+)   Anesthesia Other Findings Pt is CYPD2 heterozygote, refer to excellent pharmacy and Anesthesiology records from 03/28/15  Reproductive/Obstetrics negative OB ROS                           Anesthesia Physical  Anesthesia Plan  ASA: III  Anesthesia Plan: General   Post-op Pain Management:    Induction: Intravenous  Airway Management Planned: LMA  Additional Equipment:   Intra-op Plan:   Post-operative Plan: Extubation in OR  Informed Consent: I have reviewed the patients History and Physical, chart, labs and discussed the procedure including the risks, benefits and alternatives for the proposed anesthesia with the patient or authorized representative who has indicated his/her understanding and acceptance.   Dental Advisory Given and Dental advisory given  Plan Discussed with: CRNA  Anesthesia Plan Comments:         Anesthesia Quick Evaluation

## 2016-11-07 NOTE — Op Note (Signed)
Date of procedure: 11/07/16  Preoperative diagnosis:  1. Left ureteral stone 2. Right renal stones   Postoperative diagnosis:  1. Same   Procedure: 1. Cystoscopy 2. Left ureteroscopy 3. Attempted right ureteroscopy 4. Laser lithotripsy 5. Manipulation of stone 6. Bilateral retrograde pyelogram with interpretation 7. Bilateral ureteral stent placement 6 French by 26 cm  Surgeon: Baruch Gouty, MD  Anesthesia: General  Complications: None  Intraoperative findings: The patient had a 7 mm mid left ureteral stone that when fragmented immediately moved proximal into his extremely tortuous left ureter proximal to the obstruction. The ureteroscope was not able to be advanced into this portion of the ureter. Left retrograde pyelogram confirmed significant tortuosity and significant high-grade hydroureteronephrosis down to the level of where the stone was embedded. I was unable to place a right ureteral access sheath, so he was temporized the right ureteral stent.   EBL: None  Specimens: None  Drains: Bilateral 6 French by 26 cm double-J ureteral stents  Disposition: Stable to the postanesthesia care unit  Indication for procedure: The patient is a 76 y.o. male with a 7 mm left mid ureteral stone and a nonobstructing right renal stones of the proximal and size of 2 cm he presents today for surgical management.  After reviewing the management options for treatment, the patient elected to proceed with the above surgical procedure(s). We have discussed the potential benefits and risks of the procedure, side effects of the proposed treatment, the likelihood of the patient achieving the goals of the procedure, and any potential problems that might occur during the procedure or recuperation. Informed consent has been obtained.  Description of procedure: The patient was met in the preoperative area. All risks, benefits, and indications of the procedure were described in great detail. The patient  consented to the procedure. Preoperative antibiotics were given. The patient was taken to the operative theater. General anesthesia was induced per the anesthesia service. The patient was then placed in the dorsal lithotomy position and prepped and draped in the usual sterile fashion. A preoperative timeout was called.    21 French 30 cystoscope was inserted into the patient's bladder per urethra atraumatically. Sensor wire was introduced into the patient's left ureteral orifice up to the level of the left renal pelvis under fluoroscopy. Using a semirigid ureteroscope, the left ureteral orifice was intubated and the stone was encountered in the mid ureter. Using laser lithotripsy was broken into fragments. Immediately upon fragmenting, stones moved proximal. The ureter was incredibly tortuous from long-standing high-grade obstruction. I was unable to safely advance the ureteroscope past the original stones location to safely grab the stone fragments. A right retrograde powder and confirm his to severe extreme tortuosity and severe hydroureteronephrosis. The scope was withdrawn. Cystoscope was assembled over the sensor wire and reintroduced into the bladder. A 6 French by 26 cm double-J ureteral stent was placed and the sensor wire removed. A curl was seen in the patient's left renal pelvis under fluoroscopy and the urinary bladder with rectal his age. There is immediate drainage of massive amounts of clear yellow urine at this point. With the aid of a dual-lumen catheter, 2 sensor wires were introduced into the right ureter and advanced to level of the right renal pelvis under fluoroscopy. The cystoscope was withdrawn. An attempt was made to place the ureteral access sheath over one of the sensor wires, however was unable to safely pass it any further than the distal ureter. To avoid injury to the ureter, this was abandoned.  Decision was made at this point to temporize the patient with a right ureteral stent for  passive ureteral dilation. Due to the large stone burden in the right kidney, it was felt that a ureteral access sheath was essential to effectively remove his stone burden. A right retrograde powder was then obtained to identify the collecting system. A 6 French by 26 right ureteral stent was in place in identical fashion as the contralateral side. At this point, the patient's bladder was drained and he was woken from anesthesia and transferred to the postanesthesia care unit in stable condition.  PlanThe patient will present for repeat bilateral ureteroscopy a few weeks after undergoing passive dilation of his right collecting system. Relieving the obstruction on the left side will make left ureteroscopy possible in few weeks as the severe tortuosity and hydroureteronephrosis should resolve.  Baruch Gouty, M.D.

## 2016-11-07 NOTE — Discharge Instructions (Signed)
Ureteral Stent Implantation, Care After °Refer to this sheet in the next few weeks. These instructions provide you with information about caring for yourself after your procedure. Your health care provider may also give you more specific instructions. Your treatment has been planned according to current medical practices, but problems sometimes occur. Call your health care provider if you have any problems or questions after your procedure. °What can I expect after the procedure? °After the procedure, it is common to have: °· Nausea. °· Mild pain when you urinate. You may feel this pain in your lower back or lower abdomen. Pain should stop within a few minutes after you urinate. This may last for up to 1 week. °· A small amount of blood in your urine for several days. ° °Follow these instructions at home: ° °Medicines °· Take over-the-counter and prescription medicines only as told by your health care provider. °· If you were prescribed an antibiotic medicine, take it as told by your health care provider. Do not stop taking the antibiotic even if you start to feel better. °· Do not drive for 24 hours if you received a sedative. °· Do not drive or operate heavy machinery while taking prescription pain medicines. °Activity °· Return to your normal activities as told by your health care provider. Ask your health care provider what activities are safe for you. °· Do not lift anything that is heavier than 10 lb (4.5 kg). Follow this limit for 1 week after your procedure, or for as long as told by your health care provider. °General instructions °· Watch for any blood in your urine. Call your health care provider if the amount of blood in your urine increases. °· If you have a catheter: °? Follow instructions from your health care provider about taking care of your catheter and collection bag. °? Do not take baths, swim, or use a hot tub until your health care provider approves. °· Drink enough fluid to keep your urine  clear or pale yellow. °· Keep all follow-up visits as told by your health care provider. This is important. °Contact a health care provider if: °· You have pain that gets worse or does not get better with medicine, especially pain when you urinate. °· You have difficulty urinating. °· You feel nauseous or you vomit repeatedly during a period of more than 2 days after the procedure. °Get help right away if: °· Your urine is dark red or has blood clots in it. °· You are leaking urine (have incontinence). °· The end of the stent comes out of your urethra. °· You cannot urinate. °· You have sudden, sharp, or severe pain in your abdomen or lower back. °· You have a fever. °This information is not intended to replace advice given to you by your health care provider. Make sure you discuss any questions you have with your health care provider. °Document Released: 01/23/2013 Document Revised: 10/29/2015 Document Reviewed: 12/05/2014 °Elsevier Interactive Patient Education © 2018 [employer] ° ° ° °General Anesthesia, Adult, Care After °These instructions provide you with information about caring for yourself after your procedure. Your health care provider may also give you more specific instructions. Your treatment has been planned according to current medical practices, but problems sometimes occur. Call your health care provider if you have any problems or questions after your procedure. °What can I expect after the procedure? °After the procedure, it is common to have: °· Vomiting. °· A sore throat. °· Mental slowness. ° °It is   Nauseous.  Cold or shivery.  Sleepy.  Tired.  Sore or achy, even in parts of your body where you did not have surgery.  Follow these instructions at home: For at least 24 hours after the procedure:  Do not: ? Participate in activities where you could fall or become injured. ? Drive. ? Use heavy machinery. ? Drink alcohol. ? Take sleeping pills or medicines that  cause drowsiness. ? Make important decisions or sign legal documents. ? Take care of children on your own.  Rest. Eating and drinking  If you vomit, drink water, juice, or soup when you can drink without vomiting.  Drink enough fluid to keep your urine clear or pale yellow.  Make sure you have little or no nausea before eating solid foods.  Follow the diet recommended by your health care provider. General instructions  Have a responsible adult stay with you until you are awake and alert.  Return to your normal activities as told by your health care provider. Ask your health care provider what activities are safe for you.  Take over-the-counter and prescription medicines only as told by your health care provider.  If you smoke, do not smoke without supervision.  Keep all follow-up visits as told by your health care provider. This is important. Contact a health care provider if:  You continue to have nausea or vomiting at home, and medicines are not helpful.  You cannot drink fluids or start eating again.  You cannot urinate after 8-12 hours.  You develop a skin rash.  You have fever.  You have increasing redness at the site of your procedure. Get help right away if:  You have difficulty breathing.  You have chest pain.  You have unexpected bleeding.  You feel that you are having a life-threatening or urgent problem. This information is not intended to replace advice given to you by your health care provider. Make sure you discuss any questions you have with your health care provider. Document Released: 08/29/2000 Document Revised: 10/26/2015 Document Reviewed: 05/07/2015 Elsevier Interactive Patient Education  2018 [employer]. Cystoscopy, Care After Refer to this sheet in the next few weeks. These instructions provide you with information about caring for yourself after your procedure. Your health care provider may also give you more specific instructions. Your  treatment has been planned according to current medical practices, but problems sometimes occur. Call your health care provider if you have any problems or questions after your procedure. What can I expect after the procedure? After the procedure, it is common to have:  Mild pain when you urinate. Pain should stop within a few minutes after you urinate. This may last for up to 1 week.  A small amount of blood in your urine for several days.  Feeling like you need to urinate but producing only a small amount of urine.  Follow these instructions at home:  Medicines  Take over-the-counter and prescription medicines only as told by your health care provider.  If you were prescribed an antibiotic medicine, take it as told by your health care provider. Do not stop taking the antibiotic even if you start to feel better. General instructions   Return to your normal activities as told by your health care provider. Ask your health care provider what activities are safe for you.  Do not drive for 24 hours if you received a sedative.  Watch for any blood in your urine. If the amount of blood in your urine increases, call your health  care provider.  Follow instructions from your health care provider about eating or drinking restrictions.  If a tissue sample was removed for testing (biopsy) during your procedure, it is your responsibility to get your test results. Ask your health care provider or the department performing the test when your results will be ready.  Drink enough fluid to keep your urine clear or pale yellow.  Keep all follow-up visits as told by your health care provider. This is important. Contact a health care provider if:  You have pain that gets worse or does not get better with medicine, especially pain when you urinate.  You have difficulty urinating. Get help right away if:  You have more blood in your urine.  You have blood clots in your urine.  You have abdominal  pain.  You have a fever or chills.  You are unable to urinate. This information is not intended to replace advice given to you by your health care provider. Make sure you discuss any questions you have with your health care provider. Document Released: 12/10/2004 Document Revised: 10/29/2015 Document Reviewed: 04/09/2015 Elsevier Interactive Patient Education  2017 [employer].

## 2016-11-07 NOTE — H&P (Signed)
CC: I have kidney stones.  HPI: Troy Banks is a 76 year-old male patient who was referred by [name], MD who is here for renal calculi.  The problem is on the right side. He is currently having flank pain. He denies having back pain, groin pain, nausea, vomiting, fever, and chills. He has not caught a stone in his urine strainer since his symptoms began.   Patient 7 mm mid left ureteral stone. Also has right 1.3 cm and 1.1 renal stone and smaller 4 mm stones x2 that are nonobstructing. Visible on scout film on CT.   He currently is asymptomatic from the left ureteral obstruction. He does have intermittent right flank pain. He is voiding well without difficulty. He has had stone surgery in the past in the 80s that was performed in open fashion. He is not sure what stone she had at the time.   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 He is unable to take narcotics, Flomax, or Zofran. His pain currently is well controlled though he does have some discomfort.     ALLERGIES: acyclovir atorvastatin Benzodiazepines colchicine Compazine Coreg diazepam lisinopril Mobic ondansetron promethazine sertraline Tramadol    MEDICATIONS: Allopurinol  Crestor 10 mg tablet  Levothyroxine Sodium 25 mcg tablet  Amlodipine Besilate  Cyanocobalamin  Furosemide 40 mg tablet  Ibuprofen  Mysoline 50 mg tablet  Vitamin C  Vitamin D3  Zanaflex 4 mg capsule  Zantac 150 mg tablet     GU PSH: None   NON-GU PSH: Coronary Artery Bypass Grafting - 11/05/2014 Foot/toes Surgery Procedure - 2005 Hip Replacement, Right - 11/05/2015 Neck Spinal Fusion - 2005    GU PMH: None   NON-GU PMH: Anxiety Arthritis Gout Heart disease, unspecified Hypercholesterolemia Hypertension Hypothyroidism Sleep Apnea    FAMILY HISTORY: 1 Daughter - Daughter 1 son - Runs in Family Heart Disease - Runs in Family   SOCIAL HISTORY: Marital Status: Married Current  Smoking Status: Patient does not smoke anymore. Has not smoked since 11/05/1990. Smoked for 33 years. Smoked 1/2 pack per day.   Tobacco Use Assessment Completed: Used Tobacco in last 30 days? Drinks 1 drink per day.  Drinks 3 caffeinated drinks per day. 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    REVIEW OF SYSTEMS:    GU Review Male:   Patient denies frequent urination, hard to postpone urination, burning/ pain with urination, get up at night to urinate, leakage of urine, stream starts and stops, trouble starting your stream, have to strain to urinate , erection problems, and penile pain.  Gastrointestinal (Upper):   Patient reports indigestion/ heartburn. Patient denies nausea and vomiting.  Gastrointestinal (Lower):   Patient denies diarrhea and constipation.  Constitutional:   Patient denies fever, night sweats, weight loss, and fatigue.  Skin:   Patient denies skin rash/ lesion and itching.  Eyes:   Patient denies blurred vision and double vision.  Ears/ Nose/ Throat:   Patient denies sore throat and sinus problems.  Hematologic/Lymphatic:   Patient reports easy bruising. Patient denies swollen glands.  Cardiovascular:   Patient denies leg swelling and chest pains.  Respiratory:   Patient denies cough and shortness of breath.  Endocrine:   Patient denies excessive thirst.  Musculoskeletal:   Patient denies back pain and joint pain.  Neurological:   Patient reports headaches. Patient denies dizziness.  Psychologic:   Patient denies depression and anxiety.   VITAL SIGNS:      11/04/2016 09:39 AM  Weight  207 lb / 93.89 kg  Height 68 in / 172.72 cm  BP 172/92 mmHg  Pulse 46 /min  Temperature 97.5 F / 36 C  BMI 31.5 kg/m   MULTI-SYSTEM PHYSICAL EXAMINATION:    Constitutional: Well-nourished. No physical deformities. Normally developed. Good grooming.  Neck: Neck symmetrical, not swollen. Normal tracheal position.  Respiratory: No labored breathing, no use of accessory muscles.    Cardiovascular: Normal temperature, normal extremity pulses, no swelling, no varicosities.  Lymphatic: No enlargement of neck, axillae, groin.  Skin: No paleness, no jaundice, no cyanosis. No lesion, no ulcer, no rash.  Neurologic / Psychiatric: Oriented to time, oriented to place, oriented to person. No depression, no anxiety, no agitation.  Gastrointestinal: No mass, no tenderness, no rigidity, non obese abdomen.  Eyes: Normal conjunctivae. Normal eyelids.  Ears, Nose, Mouth, and Throat: Left ear no scars, no lesions, no masses. Right ear no scars, no lesions, no masses. Nose no scars, no lesions, no masses. Normal hearing. Normal lips.  Musculoskeletal: Normal gait and station of head and neck.     PAST DATA REVIEWED:  Source Of History:  Patient  X-Ray Review: [facility] Protocol: Reviewed Films. Reviewed Report. Discussed With Patient.     PROCEDURES:          Urinalysis w/Scope Dipstick Dipstick Cont'd Micro  Color: Yellow Bilirubin: Neg WBC/hpf: 0 - 5/hpf  Appearance: Cloudy Ketones: Neg RBC/hpf: 10 - 20/hpf  Specific Gravity: 1.020 Blood: 3+ Bacteria: Rare (0-9/hpf)  pH: 5.5 Protein: 1+ Cystals: NS (Not Seen)  Glucose: Neg Urobilinogen: 0.2 Casts: NS (Not Seen)    Nitrites: Neg Trichomonas: Not Present    Leukocyte Esterase: Neg Mucous: Present      Epithelial Cells: NS (Not Seen)      Yeast: NS (Not Seen)      Sperm: Not Present    ASSESSMENT:      ICD-10 Details  1 GU:   Renal calculus - N20.0    PLAN:           Orders Labs Urine Culture          Document Letter(s):  Created for Cathlean Cower, MD   Created for Patient: Clinical Summary    The patient and I talked about surgical treatment for their kidney stones. Etiologies of kidney stones including dehydration, poor fluid intake, intake of well water, congenital renal disease, previous bowel surgery, idiopathic, and others were discussed with the patient.   Metabolic evaluation of kidney stone disease was  discussed with the patient. Alternative treatment options including increased water intake, increased lemonade intake, and dietary moderation with calcium and oxalate containing foods were discussed with the patient in detail.   The risks, benefits, and some of the possible complications of surgical treatments including cystoscopy, ureteroscopy, renoscopy, laser lithotripsy, extracorporeal shock wave lithotripsy, stent insertion and others were discussed with the patient. All questions were answered.  The patient gave fully informed consent to proceed with a ureteroscopy with or without laser lithotripsy with stone extraction for the treatment of their kidney stones.   The patient was given instructions to call for abdominal pain, pelvic pain, perirectal pain, nausea, vomiting, diarrhea, fever over 100 degrees F, chills, hematuria, dysuria, frequency, urgency, or urge incontinence.         Notes:   I discussed the patient that my most immediate concern is his obstructing left ureteral calculus that is likely been there for some time. He also has significant stone burden on the right side. I  discussed that we will try to address this as well during the procedure. He does have over 2 cm stone burden which often is best served with percutaneous nephrolithotomy. However, since he already be under anesthesia for his left ureteral stone I think it be worth at least addressing that time. Does understand it may take multiple procedures to render him stone free. He also understands that he still may ultimately need a percutaneous nephrolithotomy on the right. He is very concerned about medications were given to given his CYP 450 enzyme not being present. We will need to take this in the, from medications are given.

## 2016-11-07 NOTE — Anesthesia Procedure Notes (Signed)
Procedure Name: LMA Insertion Date/Time: 11/07/2016 2:59 PM Performed by: Cynda Familia Pre-anesthesia Checklist: Patient identified, Emergency Drugs available, Suction available and Patient being monitored Patient Re-evaluated:Patient Re-evaluated prior to inductionOxygen Delivery Method: Circle System Utilized Preoxygenation: Pre-oxygenation with 100% oxygen Intubation Type: IV induction Ventilation: Mask ventilation without difficulty LMA: LMA inserted LMA Size: 4.0 Number of attempts: 1 Placement Confirmation: positive ETCO2 Tube secured with: Tape Dental Injury: Teeth and Oropharynx as per pre-operative assessment  Comments: Smooth IV induction Fitzgerald--- intubation AM CRNA atraumatic--- teeth and mouth as preop ( chipped teeth in front and sides prior to AW manipulation) -- bilat BS

## 2016-11-07 NOTE — Transfer of Care (Signed)
Immediate Anesthesia Transfer of Care Note  Patient: Troy Banks  Procedure(s) Performed: Procedure(s): CYSTOSCOPY/URETEROSCOPY/HOLMIUM LASER/STENT PLACEMENT (Bilateral)  Patient Location: PACU  Anesthesia Type:General  Level of Consciousness: sedated  Airway & Oxygen Therapy: Patient Spontanous Breathing and Patient connected to face mask oxygen  Post-op Assessment: Report given to RN and Post -op Vital signs reviewed and stable  Post vital signs: Reviewed and stable  Last Vitals:  Vitals:   11/07/16 1208 11/07/16 1548  BP: (!) 188/79 (!) 141/68  Pulse: (!) 54 (!) 59  Resp: 18   Temp: 36.7 C 36.7 C    Last Pain:  Vitals:   11/07/16 1235  TempSrc:   PainSc: 3       Patients Stated Pain Goal: 5 (47/34/03 [id number])  Complications: No apparent anesthesia complications

## 2016-11-07 NOTE — Anesthesia Postprocedure Evaluation (Signed)
Anesthesia Post Note  Patient: Troy Banks  Procedure(s) Performed: Procedure(s) (LRB): CYSTOSCOPY/URETEROSCOPY/HOLMIUM LASER/STENT PLACEMENT (Bilateral)     Patient location during evaluation: PACU Anesthesia Type: General Level of consciousness: awake and alert Pain management: pain level controlled Vital Signs Assessment: post-procedure vital signs reviewed and stable Respiratory status: spontaneous breathing, nonlabored ventilation and respiratory function stable Cardiovascular status: blood pressure returned to baseline and stable Postop Assessment: no signs of nausea or vomiting Anesthetic complications: no    Last Vitals:  Vitals:   11/07/16 1615 11/07/16 1630  BP: (!) 168/73 (!) 175/82  Pulse: (!) 52 (!) 51  Resp: 13 16  Temp:  36.6 C    Last Pain:  Vitals:   11/07/16 1615  TempSrc:   PainSc: 0-No pain                 Ieshia Hatcher,W. EDMOND

## 2016-11-08 ENCOUNTER — Encounter (HOSPITAL_COMMUNITY): Payer: Self-pay | Admitting: Urology

## 2016-11-08 DIAGNOSIS — N200 Calculus of kidney: Secondary | ICD-10-CM | POA: Diagnosis not present

## 2016-11-09 ENCOUNTER — Other Ambulatory Visit: Payer: Self-pay

## 2016-11-09 DIAGNOSIS — R0789 Other chest pain: Secondary | ICD-10-CM

## 2016-11-09 NOTE — Progress Notes (Signed)
am

## 2016-11-10 ENCOUNTER — Other Ambulatory Visit: Payer: Self-pay | Admitting: Urology

## 2016-11-14 NOTE — Progress Notes (Signed)
11/01/16 stress epic  echo 04/24/15 epic ekg 5.24.18 epic hgba1c 5.9 10/26/16 cxr 10/27/16  epic

## 2016-11-14 NOTE — Patient Instructions (Addendum)
[facility]  11/14/2016   Your procedure is scheduled on: 11/23/16  Report to [facility] Main  Entrance Take [place]  elevators to 3rd floor to  [place] at    0900 AM.    Call this number if you have problems the morning of surgery [phone]    Remember: ONLY 1 PERSON MAY GO WITH YOU TO SHORT STAY TO GET  READY MORNING OF YOUR SURGERY.  Do not eat food or drink liquids :After Midnight.     Take these medicines the morning of surgery with A SIP OF WATER: Allopurinol, Amlodipine, levothyroxine, primidone                                You may not have any metal on your body including hair pins and              piercings  Do not wear jewelry,  lotions, powders or perfumes, deodorant               Men may shave face and neck   Do not bring valuables to the hospital. [place].  Contacts, dentures or bridgework may not be worn into surgery.     Patients discharged the day of surgery will not be allowed to drive home.  Name and phone number of your driver:  Special Instructions: N/A              Please read over the following fact sheets you were given: _____________________________________________________________________             [facility] - Preparing for Surgery Before surgery, you can play an important role.  Because skin is not sterile, your skin needs to be as free of germs as possible.  You can reduce the number of germs on your skin by washing with CHG (chlorahexidine gluconate) soap before surgery.  CHG is an antiseptic cleaner which kills germs and bonds with the skin to continue killing germs even after washing. Please DO NOT use if you have an allergy to CHG or antibacterial soaps.  If your skin becomes reddened/irritated stop using the CHG and inform your nurse when you arrive at Short Stay. Do not shave (including legs and underarms) for at least 48 hours prior to the first  CHG shower.  You may shave your face/neck. Please follow these instructions carefully:  1.  Shower with CHG Soap the night before surgery and the  morning of Surgery.  2.  If you choose to wash your hair, wash your hair first as usual with your  normal  shampoo.  3.  After you shampoo, rinse your hair and body thoroughly to remove the  shampoo.                           4.  Use CHG as you would any other liquid soap.  You can apply chg directly  to the skin and wash                       Gently with a scrungie or clean washcloth.  5.  Apply the CHG Soap to your body ONLY FROM THE NECK  DOWN.   Do not use on face/ open                           Wound or open sores. Avoid contact with eyes, ears mouth and genitals (private parts).                       Wash face,  Genitals (private parts) with your normal soap.             6.  Wash thoroughly, paying special attention to the area where your surgery  will be performed.  7.  Thoroughly rinse your body with warm water from the neck down.  8.  DO NOT shower/wash with your normal soap after using and rinsing off  the CHG Soap.                9.  Pat yourself dry with a clean towel.            10.  Wear clean pajamas.            11.  Place clean sheets on your bed the night of your first shower and do not  sleep with pets. Day of Surgery : Do not apply any lotions/deodorants the morning of surgery.  Please wear clean clothes to the hospital/surgery center.  FAILURE TO FOLLOW THESE INSTRUCTIONS MAY RESULT IN THE CANCELLATION OF YOUR SURGERY PATIENT SIGNATURE_________________________________  NURSE SIGNATURE__________________________________  ________________________________________________________________________

## 2016-11-16 ENCOUNTER — Encounter (HOSPITAL_COMMUNITY)
Admission: RE | Admit: 2016-11-16 | Discharge: 2016-11-16 | Disposition: A | Discharge status: Home / Self Care | Payer: Medicare Other | Source: Ambulatory Visit | Attending: Urology | Admitting: Urology

## 2016-11-16 ENCOUNTER — Encounter (HOSPITAL_COMMUNITY): Payer: Self-pay

## 2016-11-16 DIAGNOSIS — Z01812 Encounter for preprocedural laboratory examination: Secondary | ICD-10-CM | POA: Diagnosis not present

## 2016-11-16 DIAGNOSIS — N202 Calculus of kidney with calculus of ureter: Secondary | ICD-10-CM | POA: Diagnosis not present

## 2016-11-16 LAB — BASIC METABOLIC PANEL
Anion gap: 10 (ref 5–15)
BUN: 20 mg/dL (ref 6–20)
CALCIUM: 9.4 mg/dL (ref 8.9–10.3)
CHLORIDE: 104 mmol/L (ref 101–111)
CO2: 29 mmol/L (ref 22–32)
Creatinine, Ser: 0.99 mg/dL (ref 0.61–1.24)
GFR calc Af Amer: >60 mL/min (ref >60)
GFR calc non Af Amer: >60 mL/min (ref >60)
GLUCOSE: 116 mg/dL — AB (ref 65–99)
Potassium: 4.1 mmol/L (ref 3.5–5.1)
Sodium: 143 mmol/L (ref 135–145)

## 2016-11-16 LAB — CBC
HCT: 42.2 % (ref 39.0–52.0)
HEMOGLOBIN: 14.5 g/dL (ref 13.0–17.0)
MCH: 30.2 pg (ref 26.0–34.0)
MCHC: 34.4 g/dL (ref 30.0–36.0)
MCV: 87.9 fL (ref 78.0–100.0)
Platelets: 143 10*3/uL — ABNORMAL LOW (ref 150–400)
RBC: 4.8 MIL/uL (ref 4.22–5.81)
RDW: 14.7 % (ref 11.5–15.5)
WBC: 6.4 10*3/uL (ref 4.0–10.5)

## 2016-11-16 NOTE — Progress Notes (Signed)
Spoke with Dr. Sabra Heck (anesthesia) about pt.'s concerns with anesthesia . Due to his CYP450 health issue that effects his anesthesia outcome . And that pt. Stated he comes out of anesthesia fighting if he is touched .

## 2016-11-21 DIAGNOSIS — N200 Calculus of kidney: Secondary | ICD-10-CM | POA: Diagnosis not present

## 2016-11-23 ENCOUNTER — Ambulatory Visit (HOSPITAL_COMMUNITY)
Admission: RE | Admit: 2016-11-23 | Discharge: 2016-11-23 | Disposition: A | Discharge status: Home / Self Care | Payer: Medicare Other | Source: Ambulatory Visit | Attending: Urology | Admitting: Urology

## 2016-11-23 ENCOUNTER — Ambulatory Visit (HOSPITAL_COMMUNITY): Payer: Medicare Other

## 2016-11-23 ENCOUNTER — Ambulatory Visit (HOSPITAL_COMMUNITY): Payer: Medicare Other | Admitting: Registered Nurse

## 2016-11-23 ENCOUNTER — Encounter (HOSPITAL_COMMUNITY): Payer: Self-pay | Admitting: *Deleted

## 2016-11-23 ENCOUNTER — Encounter (HOSPITAL_COMMUNITY)
Admission: RE | Disposition: A | Discharge status: Home / Self Care | Payer: Self-pay | Source: Ambulatory Visit | Attending: Urology

## 2016-11-23 DIAGNOSIS — N202 Calculus of kidney with calculus of ureter: Secondary | ICD-10-CM | POA: Insufficient documentation

## 2016-11-23 DIAGNOSIS — G4730 Sleep apnea, unspecified: Secondary | ICD-10-CM | POA: Insufficient documentation

## 2016-11-23 DIAGNOSIS — Z9889 Other specified postprocedural states: Secondary | ICD-10-CM | POA: Diagnosis not present

## 2016-11-23 DIAGNOSIS — I2510 Atherosclerotic heart disease of native coronary artery without angina pectoris: Secondary | ICD-10-CM | POA: Insufficient documentation

## 2016-11-23 DIAGNOSIS — Z96641 Presence of right artificial hip joint: Secondary | ICD-10-CM | POA: Insufficient documentation

## 2016-11-23 DIAGNOSIS — E1122 Type 2 diabetes mellitus with diabetic chronic kidney disease: Secondary | ICD-10-CM | POA: Diagnosis not present

## 2016-11-23 DIAGNOSIS — Z886 Allergy status to analgesic agent status: Secondary | ICD-10-CM | POA: Insufficient documentation

## 2016-11-23 DIAGNOSIS — E039 Hypothyroidism, unspecified: Secondary | ICD-10-CM | POA: Insufficient documentation

## 2016-11-23 DIAGNOSIS — Z466 Encounter for fitting and adjustment of urinary device: Secondary | ICD-10-CM | POA: Diagnosis not present

## 2016-11-23 DIAGNOSIS — M1990 Unspecified osteoarthritis, unspecified site: Secondary | ICD-10-CM | POA: Insufficient documentation

## 2016-11-23 DIAGNOSIS — Z951 Presence of aortocoronary bypass graft: Secondary | ICD-10-CM | POA: Diagnosis not present

## 2016-11-23 DIAGNOSIS — I252 Old myocardial infarction: Secondary | ICD-10-CM | POA: Insufficient documentation

## 2016-11-23 DIAGNOSIS — Z981 Arthrodesis status: Secondary | ICD-10-CM | POA: Diagnosis not present

## 2016-11-23 DIAGNOSIS — Z87891 Personal history of nicotine dependence: Secondary | ICD-10-CM | POA: Insufficient documentation

## 2016-11-23 DIAGNOSIS — N189 Chronic kidney disease, unspecified: Secondary | ICD-10-CM | POA: Diagnosis not present

## 2016-11-23 DIAGNOSIS — M109 Gout, unspecified: Secondary | ICD-10-CM | POA: Insufficient documentation

## 2016-11-23 DIAGNOSIS — Z888 Allergy status to other drugs, medicaments and biological substances status: Secondary | ICD-10-CM | POA: Insufficient documentation

## 2016-11-23 DIAGNOSIS — I509 Heart failure, unspecified: Secondary | ICD-10-CM | POA: Diagnosis not present

## 2016-11-23 DIAGNOSIS — K219 Gastro-esophageal reflux disease without esophagitis: Secondary | ICD-10-CM | POA: Insufficient documentation

## 2016-11-23 DIAGNOSIS — E7800 Pure hypercholesterolemia, unspecified: Secondary | ICD-10-CM | POA: Diagnosis not present

## 2016-11-23 DIAGNOSIS — E1151 Type 2 diabetes mellitus with diabetic peripheral angiopathy without gangrene: Secondary | ICD-10-CM | POA: Insufficient documentation

## 2016-11-23 DIAGNOSIS — N138 Other obstructive and reflux uropathy: Secondary | ICD-10-CM | POA: Diagnosis not present

## 2016-11-23 DIAGNOSIS — I130 Hypertensive heart and chronic kidney disease with heart failure and stage 1 through stage 4 chronic kidney disease, or unspecified chronic kidney disease: Secondary | ICD-10-CM | POA: Insufficient documentation

## 2016-11-23 DIAGNOSIS — Z885 Allergy status to narcotic agent status: Secondary | ICD-10-CM | POA: Diagnosis not present

## 2016-11-23 DIAGNOSIS — Z79899 Other long term (current) drug therapy: Secondary | ICD-10-CM | POA: Diagnosis not present

## 2016-11-23 DIAGNOSIS — I10 Essential (primary) hypertension: Secondary | ICD-10-CM | POA: Diagnosis not present

## 2016-11-23 DIAGNOSIS — Z791 Long term (current) use of non-steroidal anti-inflammatories (NSAID): Secondary | ICD-10-CM | POA: Diagnosis not present

## 2016-11-23 DIAGNOSIS — Z8249 Family history of ischemic heart disease and other diseases of the circulatory system: Secondary | ICD-10-CM | POA: Insufficient documentation

## 2016-11-23 DIAGNOSIS — N200 Calculus of kidney: Secondary | ICD-10-CM | POA: Diagnosis not present

## 2016-11-23 HISTORY — PX: CYSTOSCOPY/URETEROSCOPY/HOLMIUM LASER/STENT PLACEMENT: SHX6546

## 2016-11-23 LAB — GLUCOSE, CAPILLARY
GLUCOSE-CAPILLARY: 115 mg/dL — AB (ref 65–99)
GLUCOSE-CAPILLARY: 104 mg/dL — AB (ref 65–99)
GLUCOSE-CAPILLARY: 109 mg/dL — AB (ref 65–99)

## 2016-11-23 SURGERY — CYSTOSCOPY/URETEROSCOPY/HOLMIUM LASER/STENT PLACEMENT
Anesthesia: General | Site: Urethra | Laterality: Bilateral

## 2016-11-23 MED ORDER — PROPOFOL 10 MG/ML IV BOLUS
INTRAVENOUS | Status: AC
  Filled 2016-11-23: qty 20

## 2016-11-23 MED ORDER — BUPIVACAINE IN DEXTROSE 0.75-8.25 % IT SOLN
INTRATHECAL | Status: DC | PRN
  Administered 2016-11-23: 2 mL via INTRATHECAL

## 2016-11-23 MED ORDER — CEFAZOLIN SODIUM-DEXTROSE 2-4 GM/100ML-% IV SOLN
2.0000 g | INTRAVENOUS | Status: AC
  Administered 2016-11-23: 2 g via INTRAVENOUS
  Filled 2016-11-23: qty 100

## 2016-11-23 MED ORDER — IOHEXOL 300 MG/ML  SOLN
INTRAMUSCULAR | Status: DC | PRN
Start: 2016-11-23 — End: 2016-11-23
  Administered 2016-11-23: 10 mL via URETHRAL

## 2016-11-23 MED ORDER — FENTANYL CITRATE (PF) 100 MCG/2ML IJ SOLN: 25.0000 ug | INTRAMUSCULAR | Status: DC | PRN

## 2016-11-23 MED ORDER — KETOROLAC TROMETHAMINE 10 MG PO TABS: 10.0000 mg | ORAL_TABLET | Freq: Four times a day (QID) | ORAL | 0 refills | Status: DC | PRN

## 2016-11-23 MED ORDER — FENTANYL CITRATE (PF) 100 MCG/2ML IJ SOLN
25.0000 ug | INTRAMUSCULAR | Status: DC | PRN
  Administered 2016-11-23 (×2): 25 ug via INTRAVENOUS

## 2016-11-23 MED ORDER — PROPOFOL 10 MG/ML IV BOLUS
INTRAVENOUS | Status: DC | PRN
  Administered 2016-11-23: 50 mg via INTRAVENOUS
  Administered 2016-11-23: 40 mg via INTRAVENOUS
  Administered 2016-11-23: 20 mg via INTRAVENOUS
  Administered 2016-11-23: 50 mg via INTRAVENOUS

## 2016-11-23 MED ORDER — FENTANYL CITRATE (PF) 100 MCG/2ML IJ SOLN
INTRAMUSCULAR | Status: AC
  Filled 2016-11-23: qty 2

## 2016-11-23 MED ORDER — KETOROLAC TROMETHAMINE 30 MG/ML IJ SOLN
30.0000 mg | Freq: Once | INTRAMUSCULAR | Status: AC
  Administered 2016-11-23: 30 mg via INTRAVENOUS
  Filled 2016-11-23: qty 1

## 2016-11-23 MED ORDER — CEPHALEXIN 500 MG PO CAPS: 500.0000 mg | ORAL_CAPSULE | Freq: Three times a day (TID) | ORAL | 0 refills | Status: DC

## 2016-11-23 MED ORDER — FENTANYL CITRATE (PF) 250 MCG/5ML IJ SOLN
INTRAMUSCULAR | Status: AC
  Filled 2016-11-23: qty 5

## 2016-11-23 MED ORDER — KETOROLAC TROMETHAMINE 10 MG PO TABS: 10.0000 mg | ORAL_TABLET | Freq: Once | ORAL | Status: DC

## 2016-11-23 MED ORDER — KETOROLAC TROMETHAMINE 30 MG/ML IJ SOLN: 30.0000 mg | Freq: Once | INTRAMUSCULAR | Status: DC

## 2016-11-23 MED ORDER — PROPOFOL 500 MG/50ML IV EMUL
INTRAVENOUS | Status: DC | PRN
  Administered 2016-11-23: 50 ug/kg/min via INTRAVENOUS

## 2016-11-23 MED ORDER — PROPOFOL 10 MG/ML IV BOLUS
INTRAVENOUS | Status: AC
  Filled 2016-11-23: qty 40

## 2016-11-23 MED ORDER — SODIUM CHLORIDE 0.9 % IR SOLN
Status: DC | PRN
  Administered 2016-11-23 (×2): 1000 mL via INTRAVESICAL
  Administered 2016-11-23: 3000 mL via INTRAVESICAL
  Administered 2016-11-23: 1000 mL via INTRAVESICAL

## 2016-11-23 MED ORDER — LACTATED RINGERS IV SOLN
INTRAVENOUS | Status: DC
  Administered 2016-11-23: 1000 mL via INTRAVENOUS
  Administered 2016-11-23 (×2): via INTRAVENOUS

## 2016-11-23 MED ORDER — MEPERIDINE HCL 50 MG/ML IJ SOLN
6.2500 mg | INTRAMUSCULAR | Status: DC | PRN
  Administered 2016-11-23: 12.5 mg via INTRAVENOUS
  Filled 2016-11-23: qty 1

## 2016-11-23 MED FILL — CEPHALEXIN 500 MG CAPSULE: 500 | 2 days supply | Qty: 6 | Fill #0

## 2016-11-23 MED FILL — KETOROLAC 10 MG TABLET: 10 | 5 days supply | Qty: 20 | Fill #0

## 2016-11-23 SURGICAL SUPPLY — 20 items
BAG URO CATCHER STRL LF (MISCELLANEOUS) ×2 IMPLANT
BASKET ZERO TIP NITINOL 2.4FR (BASKET) ×1 IMPLANT
BSKT STON RTRVL ZERO TP 2.4FR (BASKET) ×1
CATH URET 5FR 28IN OPEN ENDED (CATHETERS) ×2 IMPLANT
CATH URET DUAL LUMEN 6-10FR 50 (CATHETERS) ×2 IMPLANT
CLOTH BEACON ORANGE TIMEOUT ST (SAFETY) ×2 IMPLANT
COVER SURGICAL LIGHT HANDLE (MISCELLANEOUS) ×1 IMPLANT
FIBER LASER FLEXIVA 365 (UROLOGICAL SUPPLIES) IMPLANT
FIBER LASER TRAC TIP (UROLOGICAL SUPPLIES) ×1 IMPLANT
GLOVE BIO SURGEON STRL SZ7.5 (GLOVE) ×2 IMPLANT
GOWN STRL REUS W/TWL LRG LVL3 (GOWN DISPOSABLE) ×2 IMPLANT
GUIDEWIRE ANG ZIPWIRE 038X150 (WIRE) IMPLANT
GUIDEWIRE STR DUAL SENSOR (WIRE) ×4 IMPLANT
IV NS 1000ML (IV SOLUTION) ×2
IV NS 1000ML BAXH (IV SOLUTION) ×1 IMPLANT
MANIFOLD NEPTUNE II (INSTRUMENTS) ×2 IMPLANT
PACK CYSTO (CUSTOM PROCEDURE TRAY) ×2 IMPLANT
SHEATH ACCESS URETERAL 38CM (SHEATH) ×1 IMPLANT
STENT CONTOUR 6FRX26X.038 (STENTS) ×2 IMPLANT
TUBING CONNECTING 10 (TUBING) ×2 IMPLANT

## 2016-11-23 NOTE — Progress Notes (Signed)
Dr. Kalman Shan notified of patient c/o chills, patient and patient's wife feels like it's an reaction to the spinal, blood sugar- 109, temp. 97.9, pt. Having drank 6 glasses of luke warm water within 2 hours, order received to reassure pt. And pt's wife that it's not a reaction to the spinal. Reinforcing of teaching done with pt. And pt's wife.

## 2016-11-23 NOTE — Progress Notes (Signed)
Orders received from Dr. Luvenia Redden, covering for [facility].

## 2016-11-23 NOTE — Op Note (Signed)
Date of procedure: 11/23/16  Preoperative diagnosis:  1. Bilateral nephrolithiasis   Postoperative diagnosis:  1. Bilateral nephrolithiasis   Procedure: 1. Cystoscopy 2. Bilateral ureteroscopy 3. Laser lithotripsy 4. Stone basketing 5. Bilateral retrograde pyelogram with interpretation 6. Bilateral ureteral stent exchange 6 [place] by 26 cm  Surgeon: Baruch Gouty, MD  Anesthesia: General  Complications: None  Intraoperative findings: The patient had small residual fragments of stones in the left renal pelvis that were dusted. On the right side, he had a 1.3 cm stone in the renal pelvis as well as a proximal 8 mm stone in the lower pole. This stone was broken into smaller pieces and removed. Bilateral retrograde pyelogram at the end of the procedure showed no significant filling defects.  EBL: None  Specimens: Right renal stone office lab  Drains: Bilateral ureteral stent exchange 6 French by 26 cm  Disposition: Stable to the postanesthesia care unit  Indication for procedure: The patient is a 76 y.o. male with history of a left ureteral stone and large right renal pelvis stone since today for staged bilateral ureteroscopy as the stone fragments were not able to be reached on first try.  After reviewing the management options for treatment, the patient elected to proceed with the above surgical procedure(s). We have discussed the potential benefits and risks of the procedure, side effects of the proposed treatment, the likelihood of the patient achieving the goals of the procedure, and any potential problems that might occur during the procedure or recuperation. Informed consent has been obtained.  Description of procedure: The patient was met in the preoperative area. All risks, benefits, and indications of the procedure were described in great detail. The patient consented to the procedure. Preoperative antibiotics were given. The patient was taken to the operative theater.  General anesthesia was induced per the anesthesia service. The patient was then placed in the dorsal lithotomy position and prepped and draped in the usual sterile fashion. A preoperative timeout was called.   A 21 French 30 cystoscope was inserted into the patient's bladder per urethra atraumatically. The left ureteral stent was then grasped with flexible graspers and brought to level of the urethral meatus. A sensor wire was exchanged up to the level of the renal pelvis under fluoroscopy. With the aid of a dual-lumen catheter, second sensor wire was then placed. Over one of the sensor wires, a flexed ureteroscope was then placed under fluoroscopy. Pan nephroscopy revealed 2-34 mm stone fragments that were presumed to be from the previously lithotripsied stone in the left ureter that was unable to be further traced proximally was tortuous ureter. The stone fragments were dusted. Pan nephroscopy revealed no more stone fragments. The ureteroscope was then withdrawn under direct visualization which showed no further stone from the ureter. A left retropyelogram was then obtained that showed no filling defects. Due to the cystoscope over the remaining sensor wire a 6 French by 26 cm double-J ureteral stent was placed. A sensor wire was removed. A curl seen in the patient's renal pelvis on the fluoroscopy and in the urinary bladder under direct visualization. On the right side, the process was repeated exchange in the right stent for 2 sensor wires. The 2 sensor wires were confirmed to be in the right renal pelvis confirmed with fluoroscopy, a ureteral access sheath was then placed under fluoroscopy atraumatically. The flexible ureteroscope was then introduced through the access sheath. The known 1.3 cm renal stone was encountered. For a proximal 45 minutes as broken smaller pieces  with laser lithotripsy and the fragments were removed. He also had a smaller 8 mm stone in the right lower pole that was relocated to the  right upper pole and broken into smaller fragments. These fragments were too removed. Pan nephroscopy revealed no further fragments at this time. This took approximately one hour to break up the stones and remove them. At this point of right retrograde polygrams showed no filling defects. There ureteral access sheath was then removed under direct visualization revealing no further stone fragments. The right ureteral stent 6 French by 26 cm was then placed in identical fashion to the contralateral side. This point the patient's bladder was drained. She smoked from anesthesia and transferred in stable condition to the postanesthesia care unit.  Plan:  The patient will follow-up next week for bilateral ureteral stent removal. His stone be sent for stone analysis. He will need a renal ultrasound in 1 month to rule out iatrogenic hydronephrosis.  Baruch Gouty, M.D.

## 2016-11-23 NOTE — Anesthesia Procedure Notes (Addendum)
Spinal  Patient location during procedure: OR Start time: 11/23/2016 11:39 AM End time: 11/23/2016 11:43 AM Staffing Anesthesiologist: Oleta Mouse Preanesthetic Checklist Completed: patient identified, surgical consent, pre-op evaluation, timeout performed, IV checked, risks and benefits discussed and monitors and equipment checked Spinal Block Patient position: sitting Prep: site prepped and draped and DuraPrep Patient monitoring: heart rate, cardiac monitor, continuous pulse ox and blood pressure Approach: midline Location: L4-5 Injection technique: single-shot Needle Needle type: Pencan  Needle gauge: 24 G Needle length: 10 cm Assessment Sensory level: T10

## 2016-11-23 NOTE — Discharge Instructions (Signed)
Ureteral Stent Implantation, Care After Refer to this sheet in the next few weeks. These instructions provide you with information about caring for yourself after your procedure. Your health care provider may also give you more specific instructions. Your treatment has been planned according to current medical practices, but problems sometimes occur. Call your health care provider if you have any problems or questions after your procedure. What can I expect after the procedure? After the procedure, it is common to have:  Nausea.  Mild pain when you urinate. You may feel this pain in your lower back or lower abdomen. Pain should stop within a few minutes after you urinate. This may last for up to 1 week.  A small amount of blood in your urine for several days.  Follow these instructions at home:  Medicines  Take over-the-counter and prescription medicines only as told by your health care provider.  If you were prescribed an antibiotic medicine, take it as told by your health care provider. Do not stop taking the antibiotic even if you start to feel better.  Do not drive for 24 hours if you received a sedative.  Do not drive or operate heavy machinery while taking prescription pain medicines. Activity  Return to your normal activities as told by your health care provider. Ask your health care provider what activities are safe for you.  Do not lift anything that is heavier than 10 lb (4.5 kg). Follow this limit for 1 week after your procedure, or for as long as told by your health care provider. General instructions  Watch for any blood in your urine. Call your health care provider if the amount of blood in your urine increases.  If you have a catheter: ? Follow instructions from your health care provider about taking care of your catheter and collection bag. ? Do not take baths, swim, or use a hot tub until your health care provider approves.  Drink enough fluid to keep your urine  clear or pale yellow.  Keep all follow-up visits as told by your health care provider. This is important. Contact a health care provider if:  You have pain that gets worse or does not get better with medicine, especially pain when you urinate.  You have difficulty urinating.  You feel nauseous or you vomit repeatedly during a period of more than 2 days after the procedure. Get help right away if:  Your urine is dark red or has blood clots in it.  You are leaking urine (have incontinence).  The end of the stent comes out of your urethra.  You cannot urinate.  You have sudden, sharp, or severe pain in your abdomen or lower back.  You have a fever. This information is not intended to replace advice given to you by your health care provider. Make sure you discuss any questions you have with your health care provider. Document Released: 01/23/2013 Document Revised: 10/29/2015 Document Reviewed: 12/05/2014 Elsevier Interactive Patient Education  2018 [employer]. Cystoscopy, Care After Refer to this sheet in the next few weeks. These instructions provide you with information about caring for yourself after your procedure. Your health care provider may also give you more specific instructions. Your treatment has been planned according to current medical practices, but problems sometimes occur. Call your health care provider if you have any problems or questions after your procedure. What can I expect after the procedure? After the procedure, it is common to have:  Mild pain when you urinate. Pain should  Pain should stop within a few minutes after you urinate. This may last for up to 1 week. °· A small amount of blood in your urine for several days. °· Feeling like you need to urinate but producing only a small amount of urine. ° °Follow these instructions at home: ° °Medicines °· Take over-the-counter and prescription medicines only as told by your health care provider. °· If you were prescribed an  antibiotic medicine, take it as told by your health care provider. Do not stop taking the antibiotic even if you start to feel better. °General instructions ° °· Return to your normal activities as told by your health care provider. Ask your health care provider what activities are safe for you. °· Do not drive for 24 hours if you received a sedative. °· Watch for any blood in your urine. If the amount of blood in your urine increases, call your health care provider. °· Follow instructions from your health care provider about eating or drinking restrictions. °· If a tissue sample was removed for testing (biopsy) during your procedure, it is your responsibility to get your test results. Ask your health care provider or the department performing the test when your results will be ready. °· Drink enough fluid to keep your urine clear or pale yellow. °· Keep all follow-up visits as told by your health care provider. This is important. °Contact a health care provider if: °· You have pain that gets worse or does not get better with medicine, especially pain when you urinate. °· You have difficulty urinating. °Get help right away if: °· You have more blood in your urine. °· You have blood clots in your urine. °· You have abdominal pain. °· You have a fever or chills. °· You are unable to urinate. °This information is not intended to replace advice given to you by your health care provider. Make sure you discuss any questions you have with your health care provider. °Document Released: 12/10/2004 Document Revised: 10/29/2015 Document Reviewed: 04/09/2015 °Elsevier Interactive Patient Education © 2017 Else[employer] Inc[employer] ° ° °

## 2016-11-23 NOTE — Progress Notes (Signed)
Pt. Ambulated to bathroom, pt. Unable to void, shivering better after demerol administered, pt's bladder scanned, 534 ml  Noted on machine. Dr. On call paged for [facility].

## 2016-11-23 NOTE — Interval H&P Note (Signed)
History and Physical Interval Note:  11/23/2016 10:02 AM  Troy Banks  has presented today for surgery, with the diagnosis of LEFT URETERAL STONE, RIGHT RENAL STONE  The various methods of treatment have been discussed with the patient and family. After consideration of risks, benefits and other options for treatment, the patient has consented to  Procedure(s) with comments: CYSTOSCOPY/URETEROSCOPY/HOLMIUM LASER/STENT EXCHANGE (Bilateral) - NEEDS 90 MIN  as a surgical intervention .  The patient's history has been reviewed, patient examined, no change in status, stable for surgery.  I have reviewed the patient's chart and labs.  Questions were answered to the patient's satisfaction.    Presents for repeat bilateral URS after unable to access right upper tract and navigate tortuous left proximal ureter.   Nickie Retort

## 2016-11-23 NOTE — Anesthesia Preprocedure Evaluation (Signed)
Anesthesia Evaluation  Patient identified by MRN, date of birth, ID band Patient awake    Reviewed: Allergy & Precautions, H&P , NPO status , Patient's Chart, lab work & pertinent test results  History of Anesthesia Complications Negative for: history of anesthetic complications  Airway Mallampati: II  TM Distance: >3 FB Neck ROM: full   Comment: C spine surgery and previous Glidescope intubation Dental  (+) Dental Advisory Given, Poor Dentition, Chipped   Pulmonary neg pulmonary ROS, sleep apnea , former smoker,    breath sounds clear to auscultation       Cardiovascular hypertension, Pt. on medications (-) angina+ CAD, + Past MI, + CABG, + Peripheral Vascular Disease and +CHF  Normal cardiovascular exam+ dysrhythmias + Valvular Problems/Murmurs MR and AS  Rhythm:regular Rate:Normal - Systolic murmurs CABG and AV in 2016  Echo 2016 - Left ventricle: The cavity size was normal. Wall thickness was increased in a pattern of mild LVH. Systolic function was normal. The estimated ejection fraction was in the range of 55% to 60%. Wall motion was normal; there were no regional wall motion abnormalities. Doppler parameters are consistent with abnormal left ventricular relaxation (grade 1 diastolic dysfunction). - Aortic valve: A bioprosthesis was present. There was trivial regurgitation. - Left atrium: The atrium was mildly dilated. - Atrial septum: There was an atrial septal aneurysm. - Pericardium, extracardiac: A small pericardial effusion was identified.  Impressions: - Normal LV systolic function; grade 1 diastolic dysfunction; mild  LVH; s/p AVR with trace AI and mean gradient of 18 mmHg; mild LAE; trace MR.   Neuro/Psych  Headaches, PSYCHIATRIC DISORDERS Anxiety Depression PTSDnegative neurological ROS     GI/Hepatic Neg liver ROS, GERD  Medicated and Controlled,  Endo/Other  diabetes, Type 2Hypothyroidism   Renal/GU Renal  disease     Musculoskeletal   Abdominal   Peds  Hematology negative hematology ROS (+)   Anesthesia Other Findings Pt is CYPD2 heterozygote, refer to excellent pharmacy and Anesthesiology records from 03/28/15  Reproductive/Obstetrics negative OB ROS                             Anesthesia Physical Anesthesia Plan  ASA: III  Anesthesia Plan: Spinal and MAC   Post-op Pain Management:    Induction:   PONV Risk Score and Plan: 1 and Treatment may vary due to age or medical condition  Airway Management Planned: Nasal Cannula and Simple Face Mask  Additional Equipment: None  Intra-op Plan:   Post-operative Plan:   Informed Consent: I have reviewed the patients History and Physical, chart, labs and discussed the procedure including the risks, benefits and alternatives for the proposed anesthesia with the patient or authorized representative who has indicated his/her understanding and acceptance.   Dental advisory given  Plan Discussed with: CRNA and Surgeon  Anesthesia Plan Comments:         Anesthesia Quick Evaluation

## 2016-11-23 NOTE — Transfer of Care (Signed)
Immediate Anesthesia Transfer of Care Note  Patient: Troy Banks  Procedure(s) Performed: Procedure(s) with comments: CYSTOSCOPY/BILATERAL URETEROSCOPY/HOLMIUM LASER/STENT EXCHANGE (Bilateral) - NEEDS 90 MIN   Patient Location: PACU  Anesthesia Type:General  Level of Consciousness:  sedated, patient cooperative and responds to stimulation  Airway & Oxygen Therapy:Patient Spontanous Breathing and Patient connected to face mask oxgen  Post-op Assessment:  Report given to PACU RN and Post -op Vital signs reviewed and stable  Post vital signs:  Reviewed and stable  Last Vitals:  Vitals:   11/23/16 0945 11/23/16 1324  BP: (!) 160/69 (!) 132/57  Pulse: (!) 57 (!) 56  Resp:  10  Temp:  38.3 C    Complications: No apparent anesthesia complications

## 2016-11-23 NOTE — H&P (View-Only) (Signed)
CC: I have kidney stones.  HPI: Troy Banks is a 76 year-old male patient who was referred by [name], MD who is here for renal calculi.  The problem is on the right side. He is currently having flank pain. He denies having back pain, groin pain, nausea, vomiting, fever, and chills. He has not caught a stone in his urine strainer since his symptoms began.   Patient 7 mm mid left ureteral stone. Also has right 1.3 cm and 1.1 renal stone and smaller 4 mm stones x2 that are nonobstructing. Visible on scout film on CT.   He currently is asymptomatic from the left ureteral obstruction. He does have intermittent right flank pain. He is voiding well without difficulty. He has had stone surgery in the past in the 80s that was performed in open fashion. He is not sure what stone she had at the time.   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 He is unable to take narcotics, Flomax, or Zofran. His pain currently is well controlled though he does have some discomfort.     ALLERGIES: acyclovir atorvastatin Benzodiazepines colchicine Compazine Coreg diazepam lisinopril Mobic ondansetron promethazine sertraline Tramadol    MEDICATIONS: Allopurinol  Crestor 10 mg tablet  Levothyroxine Sodium 25 mcg tablet  Amlodipine Besilate  Cyanocobalamin  Furosemide 40 mg tablet  Ibuprofen  Mysoline 50 mg tablet  Vitamin C  Vitamin D3  Zanaflex 4 mg capsule  Zantac 150 mg tablet     GU PSH: None   NON-GU PSH: Coronary Artery Bypass Grafting - 11/05/2014 Foot/toes Surgery Procedure - 2005 Hip Replacement, Right - 11/05/2015 Neck Spinal Fusion - 2005    GU PMH: None   NON-GU PMH: Anxiety Arthritis Gout Heart disease, unspecified Hypercholesterolemia Hypertension Hypothyroidism Sleep Apnea    FAMILY HISTORY: 1 Daughter - Daughter 1 son - Runs in Family Heart Disease - Runs in Family   SOCIAL HISTORY: Marital Status: Married Current  Smoking Status: Patient does not smoke anymore. Has not smoked since 11/05/1990. Smoked for 33 years. Smoked 1/2 pack per day.   Tobacco Use Assessment Completed: Used Tobacco in last 30 days? Drinks 1 drink per day.  Drinks 3 caffeinated drinks per day. 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    REVIEW OF SYSTEMS:    GU Review Male:   Patient denies frequent urination, hard to postpone urination, burning/ pain with urination, get up at night to urinate, leakage of urine, stream starts and stops, trouble starting your stream, have to strain to urinate , erection problems, and penile pain.  Gastrointestinal (Upper):   Patient reports indigestion/ heartburn. Patient denies nausea and vomiting.  Gastrointestinal (Lower):   Patient denies diarrhea and constipation.  Constitutional:   Patient denies fever, night sweats, weight loss, and fatigue.  Skin:   Patient denies skin rash/ lesion and itching.  Eyes:   Patient denies blurred vision and double vision.  Ears/ Nose/ Throat:   Patient denies sore throat and sinus problems.  Hematologic/Lymphatic:   Patient reports easy bruising. Patient denies swollen glands.  Cardiovascular:   Patient denies leg swelling and chest pains.  Respiratory:   Patient denies cough and shortness of breath.  Endocrine:   Patient denies excessive thirst.  Musculoskeletal:   Patient denies back pain and joint pain.  Neurological:   Patient reports headaches. Patient denies dizziness.  Psychologic:   Patient denies depression and anxiety.   VITAL SIGNS:      11/04/2016 09:39 AM  Weight  207 lb / 93.89 kg  Height 68 in / 172.72 cm  BP 172/92 mmHg  Pulse 46 /min  Temperature 97.5 F / 36 C  BMI 31.5 kg/m   MULTI-SYSTEM PHYSICAL EXAMINATION:    Constitutional: Well-nourished. No physical deformities. Normally developed. Good grooming.  Neck: Neck symmetrical, not swollen. Normal tracheal position.  Respiratory: No labored breathing, no use of accessory muscles.    Cardiovascular: Normal temperature, normal extremity pulses, no swelling, no varicosities.  Lymphatic: No enlargement of neck, axillae, groin.  Skin: No paleness, no jaundice, no cyanosis. No lesion, no ulcer, no rash.  Neurologic / Psychiatric: Oriented to time, oriented to place, oriented to person. No depression, no anxiety, no agitation.  Gastrointestinal: No mass, no tenderness, no rigidity, non obese abdomen.  Eyes: Normal conjunctivae. Normal eyelids.  Ears, Nose, Mouth, and Throat: Left ear no scars, no lesions, no masses. Right ear no scars, no lesions, no masses. Nose no scars, no lesions, no masses. Normal hearing. Normal lips.  Musculoskeletal: Normal gait and station of head and neck.     PAST DATA REVIEWED:  Source Of History:  Patient  X-Ray Review: [facility].T. Stone Protocol: Reviewed Films. Reviewed Report. Discussed With Patient.     PROCEDURES:          Urinalysis w/Scope Dipstick Dipstick Cont'd Micro  Color: Yellow Bilirubin: Neg WBC/hpf: 0 - 5/hpf  Appearance: Cloudy Ketones: Neg RBC/hpf: 10 - 20/hpf  Specific Gravity: 1.020 Blood: 3+ Bacteria: Rare (0-9/hpf)  pH: 5.5 Protein: 1+ Cystals: NS (Not Seen)  Glucose: Neg Urobilinogen: 0.2 Casts: NS (Not Seen)    Nitrites: Neg Trichomonas: Not Present    Leukocyte Esterase: Neg Mucous: Present      Epithelial Cells: NS (Not Seen)      Yeast: NS (Not Seen)      Sperm: Not Present    ASSESSMENT:      ICD-10 Details  1 GU:   Renal calculus - N20.0    PLAN:           Orders Labs Urine Culture          Document Letter(s):  Created for Cathlean Cower, MD   Created for Patient: Clinical Summary    The patient and I talked about surgical treatment for their kidney stones. Etiologies of kidney stones including dehydration, poor fluid intake, intake of well water, congenital renal disease, previous bowel surgery, idiopathic, and others were discussed with the patient.   Metabolic evaluation of kidney stone disease was  discussed with the patient. Alternative treatment options including increased water intake, increased lemonade intake, and dietary moderation with calcium and oxalate containing foods were discussed with the patient in detail.   The risks, benefits, and some of the possible complications of surgical treatments including cystoscopy, ureteroscopy, renoscopy, laser lithotripsy, extracorporeal shock wave lithotripsy, stent insertion and others were discussed with the patient. All questions were answered.  The patient gave fully informed consent to proceed with a ureteroscopy with or without laser lithotripsy with stone extraction for the treatment of their kidney stones.   The patient was given instructions to call for abdominal pain, pelvic pain, perirectal pain, nausea, vomiting, diarrhea, fever over 100 degrees F, chills, hematuria, dysuria, frequency, urgency, or urge incontinence.         Notes:   I discussed the patient that my most immediate concern is his obstructing left ureteral calculus that is likely been there for some time. He also has significant stone burden on the right side. I  discussed that we will try to address this as well during the procedure. He does have over 2 cm stone burden which often is best served with percutaneous nephrolithotomy. However, since he already be under anesthesia for his left ureteral stone I think it be worth at least addressing that time. Does understand it may take multiple procedures to render him stone free. He also understands that he still may ultimately need a percutaneous nephrolithotomy on the right. He is very concerned about medications were given to given his CYP 450 enzyme not being present. We will need to take this in the, from medications are given.

## 2016-11-23 NOTE — Anesthesia Procedure Notes (Signed)
Procedure Name: LMA Insertion Date/Time: 11/23/2016 12:07 PM Performed by: Cynda Familia Pre-anesthesia Checklist: Patient identified, Emergency Drugs available, Suction available and Patient being monitored Patient Re-evaluated:Patient Re-evaluated prior to inductionOxygen Delivery Method: Circle System Utilized Preoxygenation: Pre-oxygenation with 100% oxygen Intubation Type: IV induction Ventilation: Mask ventilation without difficulty LMA: LMA inserted LMA Size: 4.0 Tube type: Oral (20 cc air) Number of attempts: 1 Placement Confirmation: positive ETCO2 Tube secured with: Tape Dental Injury: Teeth and Oropharynx as per pre-operative assessment  Comments: Moser present for insertion--- chipped teeth prior to LMA insertion-- bilat BS

## 2016-11-24 ENCOUNTER — Encounter (HOSPITAL_COMMUNITY): Payer: Self-pay | Admitting: Urology

## 2016-11-28 ENCOUNTER — Encounter (HOSPITAL_COMMUNITY): Payer: Self-pay | Admitting: Urology

## 2016-11-28 DIAGNOSIS — R330 Drug induced retention of urine: Secondary | ICD-10-CM | POA: Diagnosis not present

## 2016-11-28 DIAGNOSIS — N200 Calculus of kidney: Secondary | ICD-10-CM | POA: Diagnosis not present

## 2016-11-28 NOTE — Anesthesia Postprocedure Evaluation (Signed)
Anesthesia Post Note  Patient: Troy Banks  Procedure(s) Performed: Procedure(s) (LRB): CYSTOSCOPY/BILATERAL URETEROSCOPY/HOLMIUM LASER/STENT EXCHANGE (Bilateral)     Patient location during evaluation: PACU Anesthesia Type: General Level of consciousness: awake Pain management: pain level controlled Vital Signs Assessment: post-procedure vital signs reviewed and stable Respiratory status: spontaneous breathing, nonlabored ventilation, respiratory function stable and patient connected to nasal cannula oxygen Cardiovascular status: stable Postop Assessment: no signs of nausea or vomiting Anesthetic complications: no    Last Vitals:  Vitals:   11/23/16 1759 11/23/16 1930  BP:  (!) 118/50  Pulse:  66  Resp:  16  Temp: 36.6 C     Last Pain:  Vitals:   11/23/16 1930  TempSrc:   PainSc: 0-No pain                 Rolly Magri

## 2016-12-15 ENCOUNTER — Other Ambulatory Visit: Payer: Self-pay | Admitting: Internal Medicine

## 2016-12-15 ENCOUNTER — Ambulatory Visit: Payer: Self-pay | Admitting: Cardiology

## 2016-12-17 ENCOUNTER — Other Ambulatory Visit: Payer: Self-pay | Admitting: Internal Medicine

## 2017-01-13 DIAGNOSIS — N200 Calculus of kidney: Secondary | ICD-10-CM | POA: Diagnosis not present

## 2017-01-16 ENCOUNTER — Telehealth: Payer: Self-pay | Admitting: Neurology

## 2017-01-16 NOTE — Telephone Encounter (Signed)
Left message on machine for patient to call back.

## 2017-01-16 NOTE — Telephone Encounter (Signed)
Spoke with [name]. Made aware of instructions. They will try this and let me know if it helps and when patient needs a refill. He just picked up RX of previous dose.

## 2017-01-16 NOTE — Telephone Encounter (Signed)
Can do 150 mg in the AM and keep 100 mg at night

## 2017-01-16 NOTE — Telephone Encounter (Signed)
Spoke with [name] (patient's significant other) and she states since patient had bladder stent placed in June his tremor has been worse. They were waiting for surgery effects to wear off but tremor just seems to be getting worse.  No new meds - they tried to put him on bladder medication but he couldn't due to his cytochrome P450.  He is still on Primidone 100 mg BID.  Last office note states he did not want to increase medications, but he would be agreeable to do that now.  No sooner appts available. Please advise.

## 2017-01-16 NOTE — Telephone Encounter (Signed)
[name] called and wanted Dr [name] to see PT sooner than his September appointment because his tremors are getting worse

## 2017-02-08 ENCOUNTER — Telehealth: Payer: Self-pay | Admitting: Neurology

## 2017-02-08 MED ORDER — PRIMIDONE 50 MG PO TABS: ORAL_TABLET | ORAL | 0 refills | Status: DC

## 2017-02-08 NOTE — Telephone Encounter (Signed)
RX sent to pharmacy with increased dosage directions.

## 2017-02-08 NOTE — Telephone Encounter (Signed)
Karmen Bongo called in regards to pt and asked for a prescription for primidone be called in since the prescription has changed

## 2017-02-20 ENCOUNTER — Other Ambulatory Visit: Payer: Self-pay | Admitting: Neurology

## 2017-02-21 NOTE — Progress Notes (Signed)
Subjective:   Troy Banks was seen in consultation in the movement disorder clinic at the request of [name], MD.  The evaluation is for tremor.  The records that were made available to me were reviewed.  Pt is accompanied by his significant other who supplements the hx.    The patient is a 76 y.o. left handed male with a history of tremor.  Pt reports tremor for at least 30 years.  Pt has noted increasing tremor over the years. Tremor is mostly in the right hand, but it is in the left as well and more rarely in the head as well.  Stress brings out the tremor.   He has never been to a neurologist for tremor.   Is currently on small amount of metoprolol - 25 mg - but that cannot be increased due to hx of bradycardia.  There is a family hx of tremor in his mother, maternal GM, maternal sister and maternal cousins.    Affected by caffeine:  No. (only drinks 2 cups coffee per week; drinks green tea) Affected by alcohol:  Used to decrease tremor but doesn't seem to any longer Affected by stress:  Yes.   Affected by fatigue:  Yes.   Spills soup if on spoon:  No. but has trouble with corn/peas Spills glass of liquid if full:  Yes.   Affects ADL's (tying shoes, brushing teeth, etc):  Yes.   (slow to tie shoes)  06/30/14 update:  Last visit, we started the patient on primidone for his tremor.  He cannot take Topamax because of history of nephrolithiasis or beta blockers because of bradycardia.  The patient reports that he is overall doing well.  For the first time in years, he was able to stand up and put on his jeans as he thinks that the medication helped his balance as well as tremor.  He has good and bad days in terms of tremor.  He has no SE in terms of tremor.  He states though that his metoprolol has been cut back since last visit because of bradycardia.    10/08/14 update:  Pt is on primidone '50mg'$  bid.  Pt states that initially it seemed helpful but nowl having tremor; had an episode  last week where couldn't shave because of tremor (not unusual to see tremor while shaving but almost unable to shave) and then whole body started shaking.  Lasted all day.  He had more word finding issues that day (has baseline word finding issues).  States that "I am concerned about something beyond familial tremor."  He is worried about PD.  Having cramping in the fingers and sometimes in the toes. Hands/fingers more than the feet.  He notices that his right arm "jerks", but thinks that that is due to "a botched neck surgery."  No significant issues with swallowing.  No diplopia.  10/30/14 update:  The patient is seen today, accompanied by his wife who supplements the history.  He had an MRI of the brain on 10/24/2014 that was unremarkable with exception of mild small vessel disease.  He had an MRI of the cervical spine demonstrating stenosis at several levels, but especially at the C6-C7 level and C3-C4 levels.  There was questionable thyroid enlargement and he was asked to follow up with his primary care physician in that regard.  On 10/28/2014 he underwent an EMG at our office.  This suggested chronic on active changes and intraspinal lesion at the right C6 level  and right S1 levels.  The etiology of this is unknown, but it could be anywhere from compressive etiologies to degeneration of the anterior horn cells.  He does not meet criteria for motor neuron disease at this point in time.  In regards to tremor, he has a few what he calls "episodes" in which his arms or legs will tremor more and may last 45 seconds.  BS was normal.  No known stress sets them off.  He may have a stuttering speech when it happens.    03/17/15 update:  The patient presents for follow-up today.  He is on primidone, 50 mg bid for essential tremor. Tremor has picked up but mostly with stress.  He thinks that we need to increase the primidone.   He is not a candidate for Topamax because of nephrolithiasis and is not a candidate for  beta blockers because of bradycardia.  We have worked him up extensively because of fasciculations.   No falls but "my balance is way off."   He had an MRI of the lumbar spine after we last met.  This was done on June 7.  There was evidence of severe facet arthrosis at the L4-L5 level and L5-S1 levels.  I told him that these findings do not explain all of his findings, including fasciculations.  He had an EEG on June 2 that was unremarkable.  This was done because of episodes of transient alteration of awareness that his wife had described.  His wife states that they didn't do the prolonged EEG because his insurance wouldn't allow it without a significant copay.  They are planning on r/s soon.  07/21/15 update:  The patient is seen today in follow-up, accompanied by his wife who supplements the history.  Much has happened since our last visit and I did review records.  Last visit, I increased his primidone to 100 mg in the morning and 50 mg at night.  He ended up being hospitalized in October, 2016 with an NSTEMI and ultimately required a coronary artery bypass graft and aortic valve replacement on 03/30/2015.  While in the hospital for this, he developed atrial fibrillation and was started on amiodarone.  Not surprisingly, this caused a significant increase in tremor.  He was rehospitalized in January, 2017 for acute gout and while he was in there he mentioned that tremor had increased.  His amiodarone was stopped.  He did have a repeat EMG since our last visit and this looked much better, without evidence of motor neuron disease.  He does feel that his balance has been a bit off.  Did not do cardiac rehab after CABG.    12/18/15 update:  The patient is seen today in follow-up.  He is accompanied by his wife who supplements the history.  I have reviewed prior records made available to me.  He remains on primidone, 100 mg in the morning and 50 mg at night. Been after amiodarone for 7 months and at the beginning of  May, he felt that tremor was much better but after hip surgery, felt that he regressed.  Surgery done under spinal anesthesia.  He had a total hip replacement on 10/27/2015.  Only did a week of therapy after that.  Felt that balance and tremor was worse after surgery.  Been losing weight through exercise.  Was put on synthroid 3 months ago.  04/19/16 update:  Pt f/u today accompanied by his wife who supplements the hx.  On primidone for ET,100 mg in  the AM/50 mg at night.  Has good days and bad days with tremor.  Reviewed Dr. Doug Sou notes.  Still off of amiodarone.  Is planning on selling home in spring.  Planning on moving to [place], [place].  Lost weight with lifestyle changes.  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  Mood much better now that back at work 5 days a week.  10/17/16 update:  Patient seen today in follow-up, accompanied by his wife who supplements the history.  The patient remains on primidone, 100 mg bid which he had increased since last visit.  He states that helped and "you can actually read my signature."  Biggest issue is low energy and he thinks that is related to his heart but he doesn't want to see the cardiologist.  He is too scared he would need another surgery.  Having MS chest pain over the scar.    Carotid ultrasound on 04/19/2016 was normal, with 1-39% stenosis bilaterally.  Little more imbalance.  Had to go on prednisone for gout.  Hasn't had A1C checked in a year.  Rarely checks BS at home - not checked in 3 weeks and "I know its high."    02/22/17 update:  Pt seen in f/u for ET.    The records that were made available to me were reviewed.  Had cystoscopy and lithotripsy since last visit.  After that tremor increased.  Called here and we increased his primidone to 150 in the AM and continued 100 mg at night.  He notes that stress makes it worse as does eating sugar.  States that he thinks that this is what he is taking but wife does his medication so he isn't  positive about that.  He is c/o loss of balance.  Is prediabetic.  Is not eating sugary foods.  Having subacute CP.  Worried about that.    Current/Previously tried tremor medications: neurontin - was up to 300 mg bid for rotator cuff issues but never seemed to help tremor; back on it at low dose for hip pain.    Outside reports reviewed: historical medical records and referral letter/letters.  Allergies  Allergen Reactions  . Benzodiazepines Other (See Comments)    Patient states he gets stroke symptoms with benzo's.   . Coreg [Carvedilol] Other (See Comments)    Accumulates and causes stroke-like side-effects due to 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 Patient poor metabolizer of CYP2D6 - Coreg undergoes extensive hepatic (including 2D6)  . Lisinopril Other (See Comments)    Hyperkalemia. 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  . Mobic [Meloxicam] Other (See Comments)    Accumulates and causes stroke-like side-effects due to 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  . Sertraline Hcl Other (See Comments)    Accumulates and causes stroke-like side-effects due to 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  . Tramadol Other (See Comments)    Accumulates and causes stroke-like side-effects due to 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  . Atorvastatin Other (See Comments)    MYALGIAS  . Colchicine Other (See Comments)    ataxia  . Compazine [Prochlorperazine Maleate] Other (See Comments)    Accumulates and causes stroke-like side-effects due to 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  . Flomax [Tamsulosin Hcl]     Accumulates and causes stroke-like side-effects due to 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  . Ondansetron Other (See Comments)    Accumulates and causes stroke-like side-effects due to 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   . Promethazine Other (See Comments)  Accumulates and causes stroke-like side-effects due to 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  .  Acyclovir And Related Other (See Comments)    Accumulates and causes stroke-like side-effects due to 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    Outpatient Encounter Prescriptions as of 02/22/2017  Medication Sig  . allopurinol (ZYLOPRIM) 300 MG tablet Take 1 tablet (300 mg total) by mouth daily.  [name] amLODipine (NORVASC) 10 MG tablet TAKE 1 TABLET BY MOUTH EVERY DAY  . Ascorbic Acid (VITAMIN C) 1000 MG tablet Take 1,000 mg by mouth 2 (two) times daily.   [name] aspirin EC 81 MG tablet Take 81 mg by mouth daily.  . blood glucose meter kit and supplies KIT Use monitor to check blood sugar twice a day. DX E11.09  . Blood Glucose Monitoring Suppl (ONE TOUCH ULTRA 2) W/DEVICE KIT Test up to two times daily  . Carboxymethylcellulose Sodium (LUBRICANT EYE DROPS OP) Place 1 drop into both eyes at bedtime.  . cephALEXin (KEFLEX) 500 MG capsule Take 1 capsule (500 mg total) by mouth 3 (three) times daily.  . cholecalciferol (VITAMIN D) 1000 units tablet Take 1,000 Units by mouth 2 (two) times daily.   . furosemide (LASIX) 40 MG tablet TAKE 1 TABLET BY MOUTH EVERY OTHER DAY  . glucose blood (ONE TOUCH TEST STRIPS) test strip Test up to two times daily  . ibuprofen (ADVIL,MOTRIN) 200 MG tablet Take 400-800 mg by mouth every 6 (six) hours as needed (pain).   [name] ketorolac (TORADOL) 10 MG tablet Take 1 tablet (10 mg total) by mouth every 6 (six) hours as needed.  [name] levothyroxine (SYNTHROID, LEVOTHROID) 25 MCG tablet TAKE 1 TABLET BY MOUTH EVERY DAY BEFORE BREAKFAST  . ONE TOUCH LANCETS MISC Test up to two times daily  . primidone (MYSOLINE) 50 MG tablet 150 mg in the morning, 100 mg in the evening  . ranitidine (ZANTAC) 150 MG tablet TAKE 1 TABLET BY MOUTH 2 TIMES DAILY  . rosuvastatin (CRESTOR) 10 MG tablet Take 0.5 tablets (5 mg total) by mouth daily.  [name] tiZANidine (ZANAFLEX) 4 MG tablet Take 1 tablet by mouth every 6 hours as needed for muscle spasms.  . Turmeric 500 MG CAPS Take 500 mg by mouth 2 (two) times daily.    . vitamin B-12 (CYANOCOBALAMIN) 1000 MCG tablet Take 1,000 mcg by mouth 2 (two) times daily.    No facility-administered encounter medications on file as of 02/22/2017.     Past Medical History:  Diagnosis Date  . Allergy   . Anxiety    PTSD  . Aortic stenosis, moderate 03/27/2015   post AVR echo Echo 11/16: Mild LVH, EF 55-60%, normal wall motion, grade 1 diastolic dysfunction, bioprosthetic AVR okay (mean gradient 18 mmHg) with trivial AI, mild LAE, atrial septal aneurysm, small effusion   . Arthritis    "qwhere" (10/29/2015)  . Carotid artery stenosis    Carotid US 11/17: 1-39% bilateral ICA stenosis. F/u prn  . Cervical stenosis of spinal canal    fusion 2006  . CHF (congestive heart failure) ([facility])    "secondry to OHS"  . COLONIC POLYPS, HX OF   . Complication of anesthesia    "takes a lot to put him under and has woken up during surgery" Difficult to wake up . PTSD; "does not metabolize RX well"  . DIVERTICULOSIS, COLON   . Dysrhythmia    2 bouts of afib post op and at home but corrected with medication.  . Familial tremor 03/19/2014  . GERD (gastroesophageal reflux  disease)    uses Zantac  . GOUT   . Headache   . Heart murmur   . High cholesterol   . History of kidney stones   . HYPERTENSION    off ACEI 2010 because of hyperkalemia in setting of ARI  . Hypothyroidism    newly diaganosed 09/2015  . Kidney stones    remote open stone-retrieval on L  . Myocardial infarction [facility])    October 20th 2016  . OSA on CPAP   . 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 PTSD (post-traumatic stress disorder)   . Sleep apnea    On a CPAP  . Type II diabetes mellitus ([facility])    diet controlled (10/28/2015)  No meds    Past Surgical History:  Procedure Laterality Date  . ANTERIOR FUSION CERVICAL SPINE  2002  . AORTIC VALVE REPLACEMENT N/A 03/30/2015   Procedure: AORTIC VALVE REPLACEMENT (AVR);  Surgeon: Grace Isaac, MD;  Location: [facility];  Service: Open Heart Surgery;  Laterality: N/A;  . BACK SURGERY    . CARDIAC CATHETERIZATION N/A 03/26/2015   Procedure: Left Heart Cath and Coronary Angiography;  Surgeon: Leonie Man, MD;  Location: [place];  Service: Cardiovascular;  Laterality: N/A;  . CARDIAC VALVE REPLACEMENT    . CORONARY ARTERY BYPASS GRAFT N/A 03/30/2015   Procedure: CORONARY ARTERY BYPASS GRAFTING (CABG) x four, using left internal mammary artery and left    leg greater saphenous vein harvested endoscopically ;  Surgeon: Grace Isaac, MD;  Location: [facility];  Service: Open Heart Surgery;  Laterality: N/A;  . CYSTOSCOPY/URETEROSCOPY/HOLMIUM LASER/STENT PLACEMENT Bilateral 11/07/2016   Procedure: CYSTOSCOPY/URETEROSCOPY/HOLMIUM LASER/STENT PLACEMENT;  Surgeon: Nickie Retort, MD;  Location: [facility];  Service: Urology;  Laterality: Bilateral;  . CYSTOSCOPY/URETEROSCOPY/HOLMIUM LASER/STENT PLACEMENT Bilateral 11/23/2016   Procedure: CYSTOSCOPY/BILATERAL URETEROSCOPY/HOLMIUM LASER/STENT EXCHANGE;  Surgeon: Nickie Retort, MD;  Location: [facility];  Service: Urology;  Laterality: Bilateral;  NEEDS 90 MIN   . HEMORRHOID BANDING  1970s  . INGUINAL HERNIA REPAIR Left 1962  . JOINT REPLACEMENT    . [place]   "cut me open"  . POSTERIOR FUSION CERVICAL SPINE  2006  . TEE WITHOUT CARDIOVERSION N/A 03/30/2015   Procedure: TRANSESOPHAGEAL ECHOCARDIOGRAM (TEE);  Surgeon: Grace Isaac, MD;  Location: [facility];  Service: Open Heart Surgery;  Laterality: N/A;  . TESTICLE TORSION REDUCTION Right 1960  . TOE FUSION Left 1985 & 2004   "great toe"  . TONSILLECTOMY    . TOTAL HIP ARTHROPLASTY Right 10/27/2015   Procedure: RIGHT TOTAL HIP ARTHROPLASTY ANTERIOR APPROACH and steroid injection right foot;  Surgeon: Mcarthur Rossetti, MD;  Location: [place] [facility];  Service: Orthopedics;  Laterality: Right;    Social History   Social History  . Marital status: Single     Spouse name: Karmen Bongo, RN  . Number of children: 2  . Years of education: N/A   Occupational History  . Retired Animal nutritionist    Social History Main Topics  . Smoking status: Former Smoker    Packs/day: 1.00    Years: 34.00    Types: Cigarettes    Quit date: 06/06/1990  . Smokeless tobacco: Never Used     Comment: widowed 1991 - lives with SO [name] who is Therapist, sports  . Alcohol use 0.6 oz/week    1 Glasses of wine per week  Comment: occasional  . Drug use: No  . Sexual activity: Yes   Other Topics Concern  . Not on file   Social History Narrative  . No narrative on file    Family Status  Relation Status  . Mother Deceased       Heart Disease, tremor  . Father Deceased       Heart Disease  . Sister Deceased       tremor  . Brother (Not Specified)       unknown  . Brother Alive       healthy  . Son Alive       healthy  . Mat Uncle (Not Specified)  . MGM (Not Specified)  . MGF (Not Specified)  . PGF (Not Specified)  . Other (Not Specified)  . Neg Hx (Not Specified)    Review of Systems A complete 10 system ROS was obtained and was negative apart from what is mentioned.   Objective:   VITALS:   Vitals:   02/22/17 0912  BP: (!) 142/64  Pulse: 60  SpO2: 96%  Weight: 203 lb (92.1 kg)  Height: '5\' 8"'$  (1.727 m)   Wt Readings from Last 3 Encounters:  02/22/17 203 lb (92.1 kg)  11/16/16 201 lb (91.2 kg)  11/07/16 202 lb 12.8 oz (92 kg)    Gen:  Appears stated age and in NAD. HEENT:  Normocephalic, atraumatic. The mucous membranes are moist. The superficial temporal arteries are without ropiness or tenderness.  No tongue fasciculations. Cardiovascular: bradycardic.  regular Lungs: Clear to auscultation bilaterally. Neck: There is L carotid bruit that is louder than the R  NEUROLOGICAL:  Orientation:  The patient is alert and oriented x 3.  Cranial nerves: There is good facial symmetry. Soft palate rises symmetrically and there is no tongue  deviation. Hearing is intact to conversational tone. Tone: Tone is good throughout. Sensation: Sensation is intact to light touch throughout. Coordination:  The patient has no dysdiadichokinesia or dysmetria. Motor: Strength is 5/5 in the bilateral upper and lower extremities.  Shoulder shrug is equal bilaterally.  There is no pronator drift.   Gait and Station: The patient is able to ambulate without difficulty.   MOVEMENT EXAM: Tremor:  There is mild head tremor.  There is head tremor in the yes direction.    Labs:    Chemistry      Component Value Date/Time   NA 143 11/16/2016 0936   K 4.1 11/16/2016 0936   CL 104 11/16/2016 0936   CO2 29 11/16/2016 0936   BUN 20 11/16/2016 0936   CREATININE 0.99 11/16/2016 0936   CREATININE 0.94 03/04/2016 0918      Component Value Date/Time   CALCIUM 9.4 11/16/2016 0936   ALKPHOS 42 10/26/2016 0935   AST 14 10/26/2016 0935   ALT 17 10/26/2016 0935   BILITOT 0.6 10/26/2016 0935     Lab Results  Component Value Date   HGBA1C 5.9 10/26/2016   Lab Results  Component Value Date   WBC 6.4 11/16/2016   HGB 14.5 11/16/2016   HCT 42.2 11/16/2016   MCV 87.9 11/16/2016   PLT 143 (L) 11/16/2016   Lab Results  Component Value Date   TSH 2.96 10/26/2016        Assessment/Plan:   1.  Essential Tremor.  -This is evidenced by the symmetrical nature and longstanding hx of gradually getting worse and family hx.  -he will continue primidone, 150 mg in the AM/100 mg at night.  He just quit his job on Friday and thinks that the lowered stress will help with the lowering of tremor.   Previously contacted pharmacy previously due to his hx of cytochrome P450 problems but told that no interaction and not an inducer of this system.  Had increased tremor previously with amiodarone.    Risks, benefits, side effects and alternative therapies were discussed.  The opportunity to ask questions was given and they were answered to the best of my ability.  The  patient expressed understanding and willingness to follow the outlined treatment protocols.  -Long discussion about DBS surgery today.  He is not interested but will continue to think about that.    -Not a candidate for topamax due to current nephrolithiasis and hx of same.  Not able to increase beta blocker due to h/o bradycardia.  May be able to push up neurontin but would need to push up to much higher dose.  2.  Dizziness/fatigue/balance change  -wonder if related to high blood sugars and possible diabetic peripheral neuropathy.  Told him needed to check blood sugars.  Told him needed to make appt with PCP for yearly physical and blood work.  Also told him he needed to make a follow up appt with Dr. [name].    -talked about working on yoga/tai chi and working on core strength.   3.  Fasciculations  -repeat EMG in 05/2015 much improved without evidence of MND  -The patient does have a compressive lesion in the cervical spine, but I'm not sure that that explains all of his symptoms.   4.  L carotid bruit  -Carotid ultrasound on 04/19/2016 was normal, with 1-39% stenosis bilaterally. 5.  Episodes of tremulousness, accompanied by potential speech change and ataxia according to his wife  - Routine EEG was negative and no further spells. 6.  Subacute chest pain  -I will try to get him an appointment with his cardiologist.   7.  Much greater than 50% of this visit was spent in counseling and coordinating care.  Total face to face time:  30 min

## 2017-02-22 ENCOUNTER — Encounter (HOSPITAL_COMMUNITY)
Admission: EM | Disposition: A | Discharge status: Skilled Nursing Facility | Payer: Self-pay | Source: Home / Self Care | Attending: Internal Medicine

## 2017-02-22 ENCOUNTER — Encounter: Payer: Self-pay | Admitting: Neurology

## 2017-02-22 ENCOUNTER — Ambulatory Visit (INDEPENDENT_AMBULATORY_CARE_PROVIDER_SITE_OTHER): Payer: Medicare Other | Admitting: Neurology

## 2017-02-22 ENCOUNTER — Emergency Department (HOSPITAL_COMMUNITY): Payer: Medicare Other

## 2017-02-22 ENCOUNTER — Encounter (HOSPITAL_COMMUNITY): Payer: Self-pay | Admitting: Emergency Medicine

## 2017-02-22 ENCOUNTER — Emergency Department (HOSPITAL_COMMUNITY): Payer: Medicare Other | Admitting: Anesthesiology

## 2017-02-22 ENCOUNTER — Inpatient Hospital Stay (HOSPITAL_COMMUNITY)
Admission: EM | Admit: 2017-02-22 | Discharge: 2017-02-27 | DRG: 493 | Disposition: A | Discharge status: Skilled Nursing Facility | Payer: Medicare Other | Attending: Internal Medicine | Admitting: Internal Medicine

## 2017-02-22 ENCOUNTER — Inpatient Hospital Stay (HOSPITAL_COMMUNITY): Payer: Medicare Other

## 2017-02-22 VITALS — BP 142/64 | HR 60 | Ht 68.0 in | Wt 203.0 lb

## 2017-02-22 DIAGNOSIS — W11XXXA Fall on and from ladder, initial encounter: Secondary | ICD-10-CM | POA: Diagnosis present

## 2017-02-22 DIAGNOSIS — Z7982 Long term (current) use of aspirin: Secondary | ICD-10-CM

## 2017-02-22 DIAGNOSIS — G250 Essential tremor: Secondary | ICD-10-CM | POA: Diagnosis not present

## 2017-02-22 DIAGNOSIS — E8889 Other specified metabolic disorders: Secondary | ICD-10-CM | POA: Diagnosis not present

## 2017-02-22 DIAGNOSIS — Z951 Presence of aortocoronary bypass graft: Secondary | ICD-10-CM

## 2017-02-22 DIAGNOSIS — S32020A Wedge compression fracture of second lumbar vertebra, initial encounter for closed fracture: Secondary | ICD-10-CM | POA: Diagnosis not present

## 2017-02-22 DIAGNOSIS — Z96641 Presence of right artificial hip joint: Secondary | ICD-10-CM | POA: Diagnosis present

## 2017-02-22 DIAGNOSIS — R402142 Coma scale, eyes open, spontaneous, at arrival to emergency department: Secondary | ICD-10-CM | POA: Diagnosis present

## 2017-02-22 DIAGNOSIS — S32000A Wedge compression fracture of unspecified lumbar vertebra, initial encounter for closed fracture: Secondary | ICD-10-CM | POA: Diagnosis not present

## 2017-02-22 DIAGNOSIS — Z87891 Personal history of nicotine dependence: Secondary | ICD-10-CM

## 2017-02-22 DIAGNOSIS — I5032 Chronic diastolic (congestive) heart failure: Secondary | ICD-10-CM | POA: Diagnosis not present

## 2017-02-22 DIAGNOSIS — G8911 Acute pain due to trauma: Secondary | ICD-10-CM | POA: Diagnosis not present

## 2017-02-22 DIAGNOSIS — I252 Old myocardial infarction: Secondary | ICD-10-CM

## 2017-02-22 DIAGNOSIS — R9431 Abnormal electrocardiogram [ECG] [EKG]: Secondary | ICD-10-CM | POA: Diagnosis not present

## 2017-02-22 DIAGNOSIS — S92909A Unspecified fracture of unspecified foot, initial encounter for closed fracture: Secondary | ICD-10-CM | POA: Diagnosis not present

## 2017-02-22 DIAGNOSIS — E7800 Pure hypercholesterolemia, unspecified: Secondary | ICD-10-CM | POA: Diagnosis not present

## 2017-02-22 DIAGNOSIS — S82892G Other fracture of left lower leg, subsequent encounter for closed fracture with delayed healing: Secondary | ICD-10-CM | POA: Diagnosis present

## 2017-02-22 DIAGNOSIS — R488 Other symbolic dysfunctions: Secondary | ICD-10-CM | POA: Diagnosis not present

## 2017-02-22 DIAGNOSIS — Z9181 History of falling: Secondary | ICD-10-CM | POA: Diagnosis not present

## 2017-02-22 DIAGNOSIS — Z952 Presence of prosthetic heart valve: Secondary | ICD-10-CM

## 2017-02-22 DIAGNOSIS — Z886 Allergy status to analgesic agent status: Secondary | ICD-10-CM

## 2017-02-22 DIAGNOSIS — Y92017 Garden or yard in single-family (private) house as the place of occurrence of the external cause: Secondary | ICD-10-CM | POA: Diagnosis not present

## 2017-02-22 DIAGNOSIS — W19XXXA Unspecified fall, initial encounter: Secondary | ICD-10-CM | POA: Diagnosis not present

## 2017-02-22 DIAGNOSIS — E119 Type 2 diabetes mellitus without complications: Secondary | ICD-10-CM | POA: Diagnosis not present

## 2017-02-22 DIAGNOSIS — K219 Gastro-esophageal reflux disease without esophagitis: Secondary | ICD-10-CM | POA: Diagnosis not present

## 2017-02-22 DIAGNOSIS — S82872A Displaced pilon fracture of left tibia, initial encounter for closed fracture: Principal | ICD-10-CM | POA: Diagnosis present

## 2017-02-22 DIAGNOSIS — G8929 Other chronic pain: Secondary | ICD-10-CM | POA: Diagnosis not present

## 2017-02-22 DIAGNOSIS — K5790 Diverticulosis of intestine, part unspecified, without perforation or abscess without bleeding: Secondary | ICD-10-CM | POA: Diagnosis not present

## 2017-02-22 DIAGNOSIS — R402362 Coma scale, best motor response, obeys commands, at arrival to emergency department: Secondary | ICD-10-CM | POA: Diagnosis present

## 2017-02-22 DIAGNOSIS — I2510 Atherosclerotic heart disease of native coronary artery without angina pectoris: Secondary | ICD-10-CM | POA: Diagnosis present

## 2017-02-22 DIAGNOSIS — Z8249 Family history of ischemic heart disease and other diseases of the circulatory system: Secondary | ICD-10-CM | POA: Diagnosis not present

## 2017-02-22 DIAGNOSIS — Z8042 Family history of malignant neoplasm of prostate: Secondary | ICD-10-CM

## 2017-02-22 DIAGNOSIS — R1311 Dysphagia, oral phase: Secondary | ICD-10-CM | POA: Diagnosis not present

## 2017-02-22 DIAGNOSIS — Z87442 Personal history of urinary calculi: Secondary | ICD-10-CM

## 2017-02-22 DIAGNOSIS — I350 Nonrheumatic aortic (valve) stenosis: Secondary | ICD-10-CM | POA: Diagnosis not present

## 2017-02-22 DIAGNOSIS — S299XXA Unspecified injury of thorax, initial encounter: Secondary | ICD-10-CM | POA: Diagnosis not present

## 2017-02-22 DIAGNOSIS — G4733 Obstructive sleep apnea (adult) (pediatric): Secondary | ICD-10-CM | POA: Diagnosis present

## 2017-02-22 DIAGNOSIS — S82892A Other fracture of left lower leg, initial encounter for closed fracture: Secondary | ICD-10-CM

## 2017-02-22 DIAGNOSIS — Z419 Encounter for procedure for purposes other than remedying health state, unspecified: Secondary | ICD-10-CM

## 2017-02-22 DIAGNOSIS — Z981 Arthrodesis status: Secondary | ICD-10-CM

## 2017-02-22 DIAGNOSIS — F4310 Post-traumatic stress disorder, unspecified: Secondary | ICD-10-CM | POA: Diagnosis not present

## 2017-02-22 DIAGNOSIS — R079 Chest pain, unspecified: Secondary | ICD-10-CM | POA: Diagnosis not present

## 2017-02-22 DIAGNOSIS — T148XXA Other injury of unspecified body region, initial encounter: Secondary | ICD-10-CM | POA: Diagnosis not present

## 2017-02-22 DIAGNOSIS — E039 Hypothyroidism, unspecified: Secondary | ICD-10-CM | POA: Diagnosis present

## 2017-02-22 DIAGNOSIS — S82392A Other fracture of lower end of left tibia, initial encounter for closed fracture: Secondary | ICD-10-CM | POA: Diagnosis not present

## 2017-02-22 DIAGNOSIS — Z86010 Personal history of colonic polyps: Secondary | ICD-10-CM | POA: Diagnosis not present

## 2017-02-22 DIAGNOSIS — R2689 Other abnormalities of gait and mobility: Secondary | ICD-10-CM | POA: Diagnosis not present

## 2017-02-22 DIAGNOSIS — Z4789 Encounter for other orthopedic aftercare: Secondary | ICD-10-CM | POA: Diagnosis not present

## 2017-02-22 DIAGNOSIS — M6281 Muscle weakness (generalized): Secondary | ICD-10-CM | POA: Diagnosis not present

## 2017-02-22 DIAGNOSIS — S32028A Other fracture of second lumbar vertebra, initial encounter for closed fracture: Secondary | ICD-10-CM | POA: Diagnosis not present

## 2017-02-22 DIAGNOSIS — S3993XA Unspecified injury of pelvis, initial encounter: Secondary | ICD-10-CM | POA: Diagnosis not present

## 2017-02-22 DIAGNOSIS — R402252 Coma scale, best verbal response, oriented, at arrival to emergency department: Secondary | ICD-10-CM | POA: Diagnosis present

## 2017-02-22 DIAGNOSIS — R0789 Other chest pain: Secondary | ICD-10-CM | POA: Diagnosis not present

## 2017-02-22 DIAGNOSIS — I110 Hypertensive heart disease with heart failure: Secondary | ICD-10-CM | POA: Diagnosis present

## 2017-02-22 DIAGNOSIS — M109 Gout, unspecified: Secondary | ICD-10-CM | POA: Diagnosis present

## 2017-02-22 DIAGNOSIS — M545 Low back pain: Secondary | ICD-10-CM | POA: Diagnosis not present

## 2017-02-22 DIAGNOSIS — I25119 Atherosclerotic heart disease of native coronary artery with unspecified angina pectoris: Secondary | ICD-10-CM | POA: Diagnosis present

## 2017-02-22 DIAGNOSIS — M25572 Pain in left ankle and joints of left foot: Secondary | ICD-10-CM | POA: Diagnosis not present

## 2017-02-22 DIAGNOSIS — S32020D Wedge compression fracture of second lumbar vertebra, subsequent encounter for fracture with routine healing: Secondary | ICD-10-CM | POA: Diagnosis not present

## 2017-02-22 DIAGNOSIS — S82899D Other fracture of unspecified lower leg, subsequent encounter for closed fracture with routine healing: Secondary | ICD-10-CM | POA: Diagnosis not present

## 2017-02-22 DIAGNOSIS — Z833 Family history of diabetes mellitus: Secondary | ICD-10-CM

## 2017-02-22 DIAGNOSIS — Z888 Allergy status to other drugs, medicaments and biological substances status: Secondary | ICD-10-CM

## 2017-02-22 HISTORY — PX: EXTERNAL FIXATION LEG: SHX1549

## 2017-02-22 LAB — CBC WITH DIFFERENTIAL/PLATELET
Basophils Absolute: 0 10*3/uL (ref 0.0–0.1)
Basophils Relative: 0 %
Eosinophils Absolute: 0.1 10*3/uL (ref 0.0–0.7)
Eosinophils Relative: 0 %
HCT: 43.1 % (ref 39.0–52.0)
HEMOGLOBIN: 14.6 g/dL (ref 13.0–17.0)
LYMPHS ABS: 1.4 10*3/uL (ref 0.7–4.0)
LYMPHS PCT: 10 %
MCH: 30.1 pg (ref 26.0–34.0)
MCHC: 33.9 g/dL (ref 30.0–36.0)
MCV: 88.9 fL (ref 78.0–100.0)
Monocytes Absolute: 1.2 10*3/uL — ABNORMAL HIGH (ref 0.1–1.0)
Monocytes Relative: 8 %
NEUTROS PCT: 82 %
Neutro Abs: 11.4 10*3/uL — ABNORMAL HIGH (ref 1.7–7.7)
Platelets: 149 10*3/uL — ABNORMAL LOW (ref 150–400)
RBC: 4.85 MIL/uL (ref 4.22–5.81)
RDW: 13.8 % (ref 11.5–15.5)
WBC: 14.1 10*3/uL — AB (ref 4.0–10.5)

## 2017-02-22 LAB — BASIC METABOLIC PANEL
ANION GAP: 9 (ref 5–15)
BUN: 14 mg/dL (ref 6–20)
CO2: 24 mmol/L (ref 22–32)
CREATININE: 0.91 mg/dL (ref 0.61–1.24)
Calcium: 9 mg/dL (ref 8.9–10.3)
Chloride: 108 mmol/L (ref 101–111)
GFR calc non Af Amer: >60 mL/min (ref >60)
Glucose, Bld: 117 mg/dL — ABNORMAL HIGH (ref 65–99)
POTASSIUM: 3.6 mmol/L (ref 3.5–5.1)
SODIUM: 141 mmol/L (ref 135–145)

## 2017-02-22 LAB — GLUCOSE, CAPILLARY: Glucose-Capillary: 135 mg/dL — ABNORMAL HIGH (ref 65–99)

## 2017-02-22 SURGERY — EXTERNAL FIXATION, LOWER EXTREMITY
Anesthesia: General | Site: Leg Lower | Laterality: Left

## 2017-02-22 MED ORDER — HYDROMORPHONE HCL 1 MG/ML IJ SOLN
0.5000 mg | Freq: Once | INTRAMUSCULAR | Status: AC
  Administered 2017-02-22: 0.5 mg via INTRAVENOUS
  Filled 2017-02-22: qty 1

## 2017-02-22 MED ORDER — PROPOFOL 10 MG/ML IV BOLUS
INTRAVENOUS | Status: AC
  Filled 2017-02-22: qty 20

## 2017-02-22 MED ORDER — PRIMIDONE 50 MG PO TABS
150.0000 mg | ORAL_TABLET | Freq: Every day | ORAL | Status: DC
  Administered 2017-02-23 – 2017-02-27 (×5): 150 mg via ORAL
  Filled 2017-02-22 (×5): qty 3

## 2017-02-22 MED ORDER — LEVOTHYROXINE SODIUM 25 MCG PO TABS
25.0000 ug | ORAL_TABLET | Freq: Every day | ORAL | Status: DC
  Administered 2017-02-23 – 2017-02-27 (×5): 25 ug via ORAL
  Filled 2017-02-22 (×6): qty 1

## 2017-02-22 MED ORDER — ACETAMINOPHEN 650 MG RE SUPP: 650.0000 mg | Freq: Four times a day (QID) | RECTAL | Status: DC | PRN

## 2017-02-22 MED ORDER — MIDAZOLAM HCL 2 MG/2ML IJ SOLN: 0.5000 mg | Freq: Once | INTRAMUSCULAR | Status: DC | PRN

## 2017-02-22 MED ORDER — CEFAZOLIN SODIUM-DEXTROSE 2-4 GM/100ML-% IV SOLN
2.0000 g | Freq: Once | INTRAVENOUS | Status: AC
  Administered 2017-02-22: 2 g via INTRAVENOUS

## 2017-02-22 MED ORDER — FAMOTIDINE 20 MG PO TABS
20.0000 mg | ORAL_TABLET | Freq: Two times a day (BID) | ORAL | Status: DC
  Administered 2017-02-23 – 2017-02-27 (×9): 20 mg via ORAL
  Filled 2017-02-22 (×9): qty 1

## 2017-02-22 MED ORDER — ASPIRIN EC 81 MG PO TBEC
81.0000 mg | DELAYED_RELEASE_TABLET | Freq: Every day | ORAL | Status: DC
  Administered 2017-02-23 – 2017-02-27 (×5): 81 mg via ORAL
  Filled 2017-02-22 (×5): qty 1

## 2017-02-22 MED ORDER — DOCUSATE SODIUM 100 MG PO CAPS
100.0000 mg | ORAL_CAPSULE | Freq: Two times a day (BID) | ORAL | Status: DC
  Administered 2017-02-23 – 2017-02-27 (×9): 100 mg via ORAL
  Filled 2017-02-22 (×9): qty 1

## 2017-02-22 MED ORDER — ACETAMINOPHEN 325 MG PO TABS: 650.0000 mg | ORAL_TABLET | Freq: Four times a day (QID) | ORAL | Status: DC | PRN

## 2017-02-22 MED ORDER — SODIUM CHLORIDE 0.9 % IV SOLN
INTRAVENOUS | Status: DC
  Administered 2017-02-22: 125 mL/h via INTRAVENOUS
  Administered 2017-02-23: 05:00:00 via INTRAVENOUS

## 2017-02-22 MED ORDER — AMLODIPINE BESYLATE 10 MG PO TABS
10.0000 mg | ORAL_TABLET | Freq: Every day | ORAL | Status: DC
  Administered 2017-02-23 – 2017-02-27 (×5): 10 mg via ORAL
  Filled 2017-02-22 (×5): qty 1

## 2017-02-22 MED ORDER — POLYETHYLENE GLYCOL 3350 17 G PO PACK: 17.0000 g | PACK | Freq: Every day | ORAL | Status: DC | PRN

## 2017-02-22 MED ORDER — SODIUM CHLORIDE 0.9 % IV BOLUS (SEPSIS)
1000.0000 mL | Freq: Once | INTRAVENOUS | Status: AC
  Administered 2017-02-22: 1000 mL via INTRAVENOUS

## 2017-02-22 MED ORDER — HYDROMORPHONE HCL 1 MG/ML IJ SOLN
0.5000 mg | INTRAMUSCULAR | Status: DC | PRN
  Administered 2017-02-22: 0.5 mg via INTRAVENOUS

## 2017-02-22 MED ORDER — SODIUM CHLORIDE 0.9 % IR SOLN
Status: DC | PRN
  Administered 2017-02-22: 1000 mL

## 2017-02-22 MED ORDER — HYDROMORPHONE HCL 1 MG/ML IJ SOLN
0.2500 mg | INTRAMUSCULAR | Status: DC | PRN
  Administered 2017-02-22 (×2): 0.25 mg via INTRAVENOUS

## 2017-02-22 MED ORDER — ROSUVASTATIN CALCIUM 5 MG PO TABS
5.0000 mg | ORAL_TABLET | Freq: Every day | ORAL | Status: DC
  Administered 2017-02-23 – 2017-02-27 (×5): 5 mg via ORAL
  Filled 2017-02-22 (×5): qty 1

## 2017-02-22 MED ORDER — CEFAZOLIN SODIUM-DEXTROSE 2-4 GM/100ML-% IV SOLN
INTRAVENOUS | Status: AC
  Filled 2017-02-22: qty 100

## 2017-02-22 MED ORDER — KETOROLAC TROMETHAMINE 15 MG/ML IJ SOLN
INTRAMUSCULAR | Status: AC
  Filled 2017-02-22: qty 1

## 2017-02-22 MED ORDER — ALLOPURINOL 300 MG PO TABS
300.0000 mg | ORAL_TABLET | Freq: Every day | ORAL | Status: DC
  Administered 2017-02-23 – 2017-02-27 (×5): 300 mg via ORAL
  Filled 2017-02-22 (×5): qty 1

## 2017-02-22 MED ORDER — ENOXAPARIN SODIUM 40 MG/0.4ML ~~LOC~~ SOLN
40.0000 mg | Freq: Every day | SUBCUTANEOUS | Status: DC
  Administered 2017-02-23 – 2017-02-27 (×5): 40 mg via SUBCUTANEOUS
  Filled 2017-02-22 (×5): qty 0.4

## 2017-02-22 MED ORDER — HYDROMORPHONE HCL 1 MG/ML IJ SOLN: 1.0000 mg | Freq: Once | INTRAMUSCULAR | Status: DC

## 2017-02-22 MED ORDER — LACTATED RINGERS IV SOLN
INTRAVENOUS | Status: DC
  Administered 2017-02-22 (×2): via INTRAVENOUS

## 2017-02-22 MED ORDER — PROMETHAZINE HCL 25 MG/ML IJ SOLN: 6.2500 mg | INTRAMUSCULAR | Status: DC | PRN

## 2017-02-22 MED ORDER — FENTANYL CITRATE (PF) 250 MCG/5ML IJ SOLN
INTRAMUSCULAR | Status: AC
  Filled 2017-02-22: qty 5

## 2017-02-22 MED ORDER — HYDROMORPHONE HCL 1 MG/ML IJ SOLN
INTRAMUSCULAR | Status: AC
  Filled 2017-02-22: qty 1

## 2017-02-22 MED ORDER — HYDROMORPHONE HCL 1 MG/ML IJ SOLN
INTRAMUSCULAR | Status: AC
  Administered 2017-02-22: 0.25 mg via INTRAVENOUS
  Filled 2017-02-22: qty 1

## 2017-02-22 MED ORDER — EPHEDRINE SULFATE 50 MG/ML IJ SOLN
INTRAMUSCULAR | Status: DC | PRN
  Administered 2017-02-22: 10 mg via INTRAVENOUS

## 2017-02-22 MED ORDER — HYDROMORPHONE HCL 1 MG/ML IJ SOLN
0.5000 mg | Freq: Once | INTRAMUSCULAR | Status: AC
  Administered 2017-02-22: 0.5 mg via INTRAVENOUS

## 2017-02-22 MED ORDER — MEPERIDINE HCL 25 MG/ML IJ SOLN: 6.2500 mg | INTRAMUSCULAR | Status: DC | PRN

## 2017-02-22 MED ORDER — PROPOFOL 10 MG/ML IV BOLUS
INTRAVENOUS | Status: DC | PRN
  Administered 2017-02-22: 200 mg via INTRAVENOUS

## 2017-02-22 MED ORDER — KETOROLAC TROMETHAMINE 15 MG/ML IJ SOLN
7.5000 mg | Freq: Four times a day (QID) | INTRAMUSCULAR | Status: DC | PRN
  Administered 2017-02-22 – 2017-02-23 (×3): 7.5 mg via INTRAVENOUS
  Filled 2017-02-22 (×2): qty 1

## 2017-02-22 MED ORDER — HYDROMORPHONE HCL 1 MG/ML IJ SOLN
0.2500 mg | INTRAMUSCULAR | Status: DC | PRN
  Administered 2017-02-23 (×2): 0.25 mg via INTRAVENOUS
  Filled 2017-02-22 (×2): qty 1

## 2017-02-22 MED ORDER — KETOROLAC TROMETHAMINE 30 MG/ML IJ SOLN
30.0000 mg | Freq: Once | INTRAMUSCULAR | Status: AC
  Administered 2017-02-22: 30 mg via INTRAVENOUS
  Filled 2017-02-22: qty 1

## 2017-02-22 MED ORDER — PRIMIDONE 50 MG PO TABS
100.0000 mg | ORAL_TABLET | Freq: Every day | ORAL | Status: DC
  Administered 2017-02-23 – 2017-02-26 (×4): 100 mg via ORAL
  Filled 2017-02-22 (×5): qty 2

## 2017-02-22 SURGICAL SUPPLY — 48 items
BANDAGE ACE 4X5 VEL STRL LF (GAUZE/BANDAGES/DRESSINGS) ×2 IMPLANT
BANDAGE ACE 6X5 VEL STRL LF (GAUZE/BANDAGES/DRESSINGS) ×2 IMPLANT
BANDAGE ELASTIC 6 VELCRO ST LF (GAUZE/BANDAGES/DRESSINGS) ×1 IMPLANT
BANDAGE ESMARK 6X9 LF (GAUZE/BANDAGES/DRESSINGS) ×1 IMPLANT
BNDG CMPR 9X6 STRL LF SNTH (GAUZE/BANDAGES/DRESSINGS) ×1
BNDG COHESIVE 3X5 TAN STRL LF (GAUZE/BANDAGES/DRESSINGS) ×1 IMPLANT
BNDG COHESIVE 6X5 TAN STRL LF (GAUZE/BANDAGES/DRESSINGS) IMPLANT
BNDG ESMARK 6X9 LF (GAUZE/BANDAGES/DRESSINGS) ×2
BNDG GAUZE ELAST 4 BULKY (GAUZE/BANDAGES/DRESSINGS) ×4 IMPLANT
BRUSH SCRUB SURG 4.25 DISP (MISCELLANEOUS) ×4 IMPLANT
CAP EXTERNAL F/PIN ×1 IMPLANT
COVER SURGICAL LIGHT HANDLE (MISCELLANEOUS) ×4 IMPLANT
DRAPE C-ARM 42X72 X-RAY (DRAPES) ×2 IMPLANT
DRAPE C-ARMOR (DRAPES) ×2 IMPLANT
DRAPE U-SHAPE 47X51 STRL (DRAPES) ×2 IMPLANT
ELECT REM PT RETURN 9FT ADLT (ELECTROSURGICAL) ×2
ELECTRODE REM PT RTRN 9FT ADLT (ELECTROSURGICAL) ×1 IMPLANT
GAUZE SPONGE 4X4 12PLY STRL (GAUZE/BANDAGES/DRESSINGS) ×2 IMPLANT
GAUZE XEROFORM 1X8 LF (GAUZE/BANDAGES/DRESSINGS) ×2 IMPLANT
GAUZE XEROFORM 5X9 LF (GAUZE/BANDAGES/DRESSINGS) ×2 IMPLANT
GLOVE BIO SURGEON STRL SZ7.5 (GLOVE) ×2 IMPLANT
GLOVE BIO SURGEON STRL SZ8 (GLOVE) ×2 IMPLANT
GLOVE BIOGEL PI IND STRL 8 (GLOVE) ×2 IMPLANT
GLOVE BIOGEL PI INDICATOR 8 (GLOVE) ×2
GOWN STRL REUS W/ TWL LRG LVL3 (GOWN DISPOSABLE) ×2 IMPLANT
GOWN STRL REUS W/ TWL XL LVL3 (GOWN DISPOSABLE) ×1 IMPLANT
GOWN STRL REUS W/TWL LRG LVL3 (GOWN DISPOSABLE) ×4
GOWN STRL REUS W/TWL XL LVL3 (GOWN DISPOSABLE) ×2
KIT BASIN OR (CUSTOM PROCEDURE TRAY) ×2 IMPLANT
KIT ROOM TURNOVER OR (KITS) ×2 IMPLANT
MANIFOLD NEPTUNE II (INSTRUMENTS) ×2 IMPLANT
NS IRRIG 1000ML POUR BTL (IV SOLUTION) ×2 IMPLANT
PACK ORTHO EXTREMITY (CUSTOM PROCEDURE TRAY) ×2 IMPLANT
PAD ARMBOARD 7.5X6 YLW CONV (MISCELLANEOUS) ×4 IMPLANT
PADDING CAST COTTON 6X4 STRL (CAST SUPPLIES) ×4 IMPLANT
PIN APEX 5X150 (EXFIX) ×2 IMPLANT
PIN CLAMP 5H 2POST STRT 4X5X6 (EXFIX) ×1 IMPLANT
PIN TO ROD COUPLING INVERT (EXFIX) ×4 IMPLANT
PIN TRANSFIX 615X300 EXFIX (EXFIX) ×2 IMPLANT
PROTECTIVE END CAP ×1 IMPLANT
ROD CARBON (EXFIX) ×4
ROD CNCT 400X11XNS LF HFMN (EXFIX) IMPLANT
ROD HOFFMANN3 CONNECT 11X200 (EXFIX) ×2 IMPLANT
ROD TO ROD COUPLING EXFIX (EXFIX) ×4 IMPLANT
SPONGE LAP 18X18 X RAY DECT (DISPOSABLE) IMPLANT
STOCKINETTE IMPERVIOUS LG (DRAPES) IMPLANT
TOWEL OR 17X26 10 PK STRL BLUE (TOWEL DISPOSABLE) ×4 IMPLANT
UNDERPAD 30X30 (UNDERPADS AND DIAPERS) ×2 IMPLANT

## 2017-02-22 NOTE — ED Triage Notes (Signed)
Per [facility] patient fell off roof while cleaning gutters, patient reports 12-15 ft fall. Patient denies hitting head, denies loc, denies use of blood thinners. Patient complaining of left ankle pain with obvious deformity, and complaining of lower back pain. No other complaints. Patient alert and oriented x4. Patient reports medication bracelet with multiple allergies and consultation is need before any medication.

## 2017-02-22 NOTE — Op Note (Signed)
02/22/2017  9:46 PM  PATIENT:  Troy Banks    PRE-OPERATIVE DIAGNOSIS:  left ankle fracture  POST-OPERATIVE DIAGNOSIS:  Same  PROCEDURE:  EXTERNAL FIXATION LEFT ANKLE  SURGEON:  Vinette Crites, Ernesta Amble, MD  ASSISTANT: Roxan Hockey, [name], he was present and scrubbed throughout the case, critical for completion in a timely fashion, and for retraction, instrumentation, and closure.   ANESTHESIA:   gen  PREOPERATIVE INDICATIONS:  [name] is a  76 y.o. male with a diagnosis of left ankle fracture who failed conservative measures and elected for surgical management.    The risks benefits and alternatives were discussed with the patient preoperatively including but not limited to the risks of infection, bleeding, nerve injury, cardiopulmonary complications, the need for revision surgery, among others, and the patient was willing to proceed.  OPERATIVE IMPLANTS: stryker ex fix  OPERATIVE FINDINGS: unstable fracture  BLOOD LOSS: min  COMPLICATIONS: none  TOURNIQUET TIME: none  OPERATIVE PROCEDURE:  Patient was identified in the preoperative holding area and site was marked by me He was transported to the operating theater and placed on the table in supine position taking care to pad all bony prominences. After a preincinduction time out anesthesia was induced. The left lower extremity was prepped and draped in normal sterile fashion and a pre-incision timeout was performed. He received ancef for preoperative antibiotics.   I first placed tibial external fixator pins used fluoroscopy to confirm we are far enough away from the fracture site it was roughly be out of of position for the plate. I used x-ray to confirm appropriate placement of these pins.  I then placed 2 calcaneal transfix pins under fluoroscopic guidance.  Next I assembled a delta frame around his ankle. I pulled his ankle out to length I took multiple x-rays and readjusted for appropriate position. His fibula  was not fractured saw was able to use this as a length key for the talus.  Once as happy with the final position of his foot underneath his peon I tightened everything into place and final tightened.  Sterile dressings were then applied he was awoken and taken the PACU in stable condition  POST OPERATIVE PLAN: Nonweightbearing left lower extremity elevate plan for ORIF once appropriate chemical DVT prophylaxis

## 2017-02-22 NOTE — Patient Instructions (Signed)
1. We have you scheduled to see Cardiology on March 30, 2017 at 4:00 pm with Dr. [name].  If acute chest pain, go to the ER.

## 2017-02-22 NOTE — ED Notes (Signed)
Patient transported to X-ray 

## 2017-02-22 NOTE — Transfer of Care (Signed)
Immediate Anesthesia Transfer of Care Note  Patient: Troy Banks  Procedure(s) Performed: Procedure(s): EXTERNAL FIXATION LEFT ANKLE (Left)  Patient Location: PACU  Anesthesia Type:General  Level of Consciousness: awake, alert  and oriented  Airway & Oxygen Therapy: Patient Spontanous Breathing and Patient connected to face mask oxygen  Post-op Assessment: Report given to RN and Post -op Vital signs reviewed and stable  Post vital signs: Reviewed and stable  Last Vitals:  Vitals:   02/22/17 1815 02/22/17 1900  BP: (!) 172/83 (!) 164/83  Pulse: 72 66  Resp: 16 15  Temp:    SpO2: 97% 92%    Last Pain:  Vitals:   02/22/17 1921  TempSrc:   PainSc: 10-Worst pain ever         Complications: No apparent anesthesia complications

## 2017-02-22 NOTE — H&P (Signed)
History and Physical    [name] QZR:[id number] DOB: [birth year] DOA: 02/22/2017  PCP: [name], MD  Patient coming from: Home  I have personally briefly reviewed patient's old medical records in [place]  Chief Complaint: Ankle pain  HPI: Troy Banks is a 76 y.o. male with medical history significant of CAD s/p CABG, AS s/p AVR, diastolic CHF with preserved EF, HTN.  Patient presents to the ED after falling off of a roof today.  Had been feeling fine just before the fall.  Landed on L ankle.  Severe L ankle pain and deformity after fall.   ED Course: Found to have fracture dislocation of L ankle.  Dr. Percell Miller taking patient to OR now, hospitalist asked to admit.   Review of Systems: As per HPI otherwise 10 point review of systems negative.   Past Medical History:  Diagnosis Date  . Allergy   . Anxiety    PTSD  . Aortic stenosis, moderate 03/27/2015   post AVR echo Echo 11/16: Mild LVH, EF 55-60%, normal wall motion, grade 1 diastolic dysfunction, bioprosthetic AVR okay (mean gradient 18 mmHg) with trivial AI, mild LAE, atrial septal aneurysm, small effusion   . Arthritis    "qwhere" (10/29/2015)  . Carotid artery stenosis    Carotid US 11/17: 1-39% bilateral ICA stenosis. F/u prn  . Cervical stenosis of spinal canal    fusion 2006  . CHF (congestive heart failure) ([place])    "secondry to OHS"  . COLONIC POLYPS, HX OF   . Complication of anesthesia    "takes a lot to put him under and has woken up during surgery" Difficult to wake up . PTSD; "does not metabolize RX well"  . DIVERTICULOSIS, COLON   . Dysrhythmia    2 bouts of afib post op and at home but corrected with medication.  . Familial tremor 03/19/2014  . GERD (gastroesophageal reflux disease)    uses Zantac  . GOUT   . Headache   . Heart murmur   . High cholesterol   . History of kidney stones   . HYPERTENSION    off ACEI 2010 because of hyperkalemia in setting of ARI  .  Hypothyroidism    newly diaganosed 09/2015  . Kidney stones    remote open stone-retrieval on L  . Myocardial infarction [facility])    October 20th 2016  . OSA on CPAP   . 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 PTSD (post-traumatic stress disorder)   . Sleep apnea    On a CPAP    Past Surgical History:  Procedure Laterality Date  . ANTERIOR FUSION CERVICAL SPINE  2002  . AORTIC VALVE REPLACEMENT N/A 03/30/2015   Procedure: AORTIC VALVE REPLACEMENT (AVR);  Surgeon: Grace Isaac, MD;  Location: [place];  Service: Open Heart Surgery;  Laterality: N/A;  . BACK SURGERY    . CARDIAC CATHETERIZATION N/A 03/26/2015   Procedure: Left Heart Cath and Coronary Angiography;  Surgeon: Leonie Man, MD;  Location: [place] [facility];  Service: Cardiovascular;  Laterality: N/A;  . CARDIAC VALVE REPLACEMENT    . CORONARY ARTERY BYPASS GRAFT N/A 03/30/2015   Procedure: CORONARY ARTERY BYPASS GRAFTING (CABG) x four, using left internal mammary artery and left    leg greater saphenous vein harvested endoscopically ;  Surgeon: Grace Isaac, MD;  Location: [place];  Service: Open Heart Surgery;  Laterality:  N/A;  . CYSTOSCOPY/URETEROSCOPY/HOLMIUM LASER/STENT PLACEMENT Bilateral 11/07/2016   Procedure: CYSTOSCOPY/URETEROSCOPY/HOLMIUM LASER/STENT PLACEMENT;  Surgeon: Nickie Retort, MD;  Location: [facility];  Service: Urology;  Laterality: Bilateral;  . CYSTOSCOPY/URETEROSCOPY/HOLMIUM LASER/STENT PLACEMENT Bilateral 11/23/2016   Procedure: CYSTOSCOPY/BILATERAL URETEROSCOPY/HOLMIUM LASER/STENT EXCHANGE;  Surgeon: Nickie Retort, MD;  Location: [facility];  Service: Urology;  Laterality: Bilateral;  NEEDS 90 MIN   . HEMORRHOID BANDING  1970s  . INGUINAL HERNIA REPAIR Left 1962  . JOINT REPLACEMENT    . [place]   "cut me open"  . POSTERIOR FUSION CERVICAL SPINE  2006  . TEE WITHOUT CARDIOVERSION N/A 03/30/2015    Procedure: TRANSESOPHAGEAL ECHOCARDIOGRAM (TEE);  Surgeon: Grace Isaac, MD;  Location: [place];  Service: Open Heart Surgery;  Laterality: N/A;  . TESTICLE TORSION REDUCTION Right 1960  . TOE FUSION Left 1985 & 2004   "great toe"  . TONSILLECTOMY    . TOTAL HIP ARTHROPLASTY Right 10/27/2015   Procedure: RIGHT TOTAL HIP ARTHROPLASTY ANTERIOR APPROACH and steroid injection right foot;  Surgeon: Mcarthur Rossetti, MD;  Location: [place];  Service: Orthopedics;  Laterality: Right;     reports that he quit smoking about 26 years ago. His smoking use included Cigarettes. He has a 34.00 pack-year smoking history. He has never used smokeless tobacco. He reports that he drinks about 0.6 oz of alcohol per week . He reports that he does not use drugs.  Allergies  Allergen Reactions  . Benzodiazepines Other (See Comments)    Patient states he gets stroke symptoms with benzo's.   . Coreg [Carvedilol] Other (See Comments)    Accumulates and causes stroke-like side-effects due to 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 Patient poor metabolizer of CYP2D6 - Coreg undergoes extensive hepatic (including 2D6)  . Lisinopril Other (See Comments)    Hyperkalemia. 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  . Mobic [Meloxicam] Other (See Comments)    Accumulates and causes stroke-like side-effects due to 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  . Sertraline Hcl Other (See Comments)    Accumulates and causes stroke-like side-effects due to 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  . Tramadol Other (See Comments)    Accumulates and causes stroke-like side-effects due to 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  . Atorvastatin Other (See Comments)    MYALGIAS  . Colchicine Other (See Comments)    ataxia  . Compazine [Prochlorperazine Maleate] Other (See Comments)    Accumulates and causes stroke-like side-effects due to 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  . Flomax [Tamsulosin Hcl]      Accumulates and causes stroke-like side-effects due to 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  . Ondansetron Other (See Comments)    Accumulates and causes stroke-like side-effects due to 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   . Promethazine Other (See Comments)    Accumulates and causes stroke-like side-effects due to 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  . Acyclovir And Related Other (See Comments)    Accumulates and causes stroke-like side-effects due to 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    Family History  Problem Relation Age of Onset  . Heart disease Mother   . Hypertension Mother   . Diabetes Mother   . Heart disease Father   . Hypertension Father   . Prostate cancer Maternal Uncle   . Hypertension Maternal Grandmother   . Heart disease Maternal Grandmother   . Hypertension Maternal Grandfather   . Heart disease Maternal Grandfather   . Hypertension Paternal Grandfather   . Heart disease Paternal Grandfather   .  Diabetes Paternal Grandfather   . Alcohol abuse Other   . Arthritis Other   . Colon polyps Neg Hx   . Esophageal cancer Neg Hx   . Rectal cancer Neg Hx   . Stomach cancer Neg Hx      Prior to Admission medications   Medication Sig Start Date End Date Taking? Authorizing Provider  allopurinol (ZYLOPRIM) 300 MG tablet Take 1 tablet (300 mg total) by mouth daily. 08/26/16  Yes [name], MD  amLODipine (NORVASC) 10 MG tablet TAKE 1 TABLET BY MOUTH EVERY DAY Patient taking differently: TAKE 10MG BY MOUTH EVERY DAY 12/15/16  Yes [name], MD  Ascorbic Acid (VITAMIN C) 1000 MG tablet Take 1,000 mg by mouth 2 (two) times daily.    Yes [provider]  aspirin EC 81 MG tablet Take 81 mg by mouth daily.   Yes [provider]  Carboxymethylcellulose Sodium (LUBRICANT EYE DROPS OP) Place 1 drop into both eyes at bedtime.   Yes [provider]  cholecalciferol (VITAMIN D) 1000 units tablet Take 1,000 Units by mouth 2 (two) times daily.    Yes  [provider]  furosemide (LASIX) 40 MG tablet TAKE 1 TABLET BY MOUTH EVERY OTHER DAY Patient taking differently: TAKE 40MG BY MOUTH EVERY OTHER DAY 07/25/16  Yes [name], [name], MD  ibuprofen (ADVIL,MOTRIN) 200 MG tablet Take 400-800 mg by mouth every 6 (six) hours as needed (pain).    Yes [provider]  ketorolac (TORADOL) 10 MG tablet Take 1 tablet (10 mg total) by mouth every 6 (six) hours as needed. 11/23/16  Yes Nickie Retort, MD  levothyroxine (SYNTHROID, LEVOTHROID) 25 MCG tablet TAKE 1 TABLET BY MOUTH EVERY DAY BEFORE BREAKFAST Patient taking differently: TAKE 25MG BY MOUTH EVERY DAY BEFORE BREAKFAST 12/19/16  Yes [name], MD  primidone (MYSOLINE) 50 MG tablet 150 mg in the morning, 100 mg in the evening 02/08/17  Yes [name], [name], DO  ranitidine (ZANTAC) 150 MG tablet TAKE 1 TABLET BY MOUTH 2 TIMES DAILY 10/24/16  Yes [name], MD  rosuvastatin (CRESTOR) 10 MG tablet Take 0.5 tablets (5 mg total) by mouth daily. 01/05/16  Yes [name], [name], MD  tiZANidine (ZANAFLEX) 4 MG tablet Take 1 tablet by mouth every 6 hours as needed for muscle spasms. 05/26/16  Yes [name], MD  Turmeric 500 MG CAPS Take 500 mg by mouth 2 (two) times daily.   Yes [provider]  vitamin B-12 (CYANOCOBALAMIN) 1000 MCG tablet Take 1,000 mcg by mouth 2 (two) times daily.    Yes [provider]  blood glucose meter kit and supplies KIT Use monitor to check blood sugar twice a day. DX E11.09 09/29/14   Rowe Clack, MD  Blood Glucose Monitoring Suppl (ONE TOUCH ULTRA 2) W/DEVICE KIT Test up to two times daily 09/29/14   Rowe Clack, MD  glucose blood (ONE TOUCH TEST STRIPS) test strip Test up to two times daily 09/29/14   Rowe Clack, MD  ONE TOUCH LANCETS MISC Test up to two times daily 09/29/14   Rowe Clack, MD    Physical Exam: Vitals:   02/22/17 1745 02/22/17 1800 02/22/17 1815 02/22/17 1900  BP: (!) 169/88 (!) 169/75 (!)  172/83 (!) 164/83  Pulse: 73 70 72 66  Resp: _0 Temp:      TempSrc:      SpO2: 98% 96% 97% 92%  Weight:      Height:        Constitutional: NAD, calm, comfortable except with movement which causes pain in L ankle Eyes: PERRL, lids and conjunctivae normal ENMT: Mucous membranes are moist. Posterior pharynx clear of any exudate or lesions.Normal dentition.  Neck: normal, supple, no masses, no thyromegaly Respiratory: clear to auscultation bilaterally, no wheezing, no crackles. Normal respiratory effort. No accessory muscle use.  Cardiovascular: Regular rate and rhythm, no murmurs / rubs / gallops. No extremity edema. 2+ pedal pulses. No carotid bruits.  Abdomen: no tenderness, no masses palpated. No hepatosplenomegaly. Bowel sounds positive.  Musculoskeletal: Swelling of L ankle noted, skin intact, DP present Skin: no rashes, lesions, ulcers. No induration Neurologic: MAE Psychiatric: Normal judgment and insight. Alert and oriented x 3. Normal mood.    Labs on Admission: I have personally reviewed following labs and imaging studies  CBC:  Recent Labs Lab 02/22/17 1836  WBC 14.1*  NEUTROABS 11.4*  HGB 14.6  HCT 43.1  MCV 88.9  PLT 157*   Basic Metabolic Panel:  Recent Labs Lab 02/22/17 1836  NA 141  K 3.6  CL 108  CO2 24  GLUCOSE 117*  BUN 14  CREATININE 0.91  CALCIUM 9.0   GFR: Estimated Creatinine Clearance: 78.5 mL/min (by C-G formula based on SCr of 0.91 mg/dL). Liver Function Tests: No results for input(s): AST, ALT, ALKPHOS, BILITOT, PROT, ALBUMIN in the last 168 hours. No results for input(s): LIPASE, AMYLASE in the last 168 hours. No results for input(s): AMMONIA in the last 168 hours. Coagulation Profile: No results for input(s): INR, PROTIME in the last 168 hours. Cardiac Enzymes: No results for input(s): CKTOTAL, CKMB, CKMBINDEX, TROPONINI in the last 168 hours. BNP (last 3 results) No results for input(s): PROBNP in the last 8760  hours. HbA1C: No results for input(s): HGBA1C in the last 72 hours. CBG: No results for input(s): GLUCAP in the last 168 hours. Lipid Profile: No results for input(s): CHOL, HDL, LDLCALC, TRIG, CHOLHDL, LDLDIRECT in the last 72 hours. Thyroid Function Tests: No results for input(s): TSH, T4TOTAL, FREET4, T3FREE, THYROIDAB in the last 72 hours. Anemia Panel: No results for input(s): VITAMINB12, FOLATE, FERRITIN, TIBC, IRON, RETICCTPCT in the last 72 hours. Urine analysis:    Component Value Date/Time   COLORURINE YELLOW 10/26/2016 0935   APPEARANCEUR CLEAR 10/26/2016 0935   LABSPEC <=1.005 (A) 10/26/2016 0935   PHURINE 6.0 10/26/2016 0935   GLUCOSEU NEGATIVE 10/26/2016 0935   HGBUR LARGE (A) 10/26/2016 0935   BILIRUBINUR NEGATIVE 10/26/2016 0935   KETONESUR NEGATIVE 10/26/2016 0935   PROTEINUR NEGATIVE 03/28/2015 1918   UROBILINOGEN 0.2 10/26/2016 0935   NITRITE NEGATIVE 10/26/2016 0935   LEUKOCYTESUR NEGATIVE 10/26/2016 0935    Radiological Exams on Admission: Dg Ankle Left Port  Result Date: 02/22/2017 CLINICAL DATA:  Pain following fall EXAM: PORTABLE LEFT ANKLE - 2 VIEW COMPARISON:  None. FINDINGS: Frontal and lateral views were obtained it. There is a comminuted fracture of the distal tibia with displaced fracture fragments distally. Fracture extends to the tibial plafond with gross ankle mortise disruption. There is avulsion of the medial malleolus. Fibula does not appear fractured. There is associated soft tissue swelling. There are small calcaneal spurs. IMPRESSION: Comminuted fracture of the distal tibia extending from the diaphysis seal region through the metaphysis and into the plafond. There is avulsion of the medial malleolus from the remainder of the tibia. There is gross ankle mortise disruption. Fibula appears intact. Electronically Signed   By:  Lowella Grip III M.D.   On: 02/22/2017 18:08   Dg Foot 2 Views Left  Result Date: 02/22/2017 CLINICAL DATA:  Fall from  ladder, left ankle comminuted fracture dislocation EXAM: LEFT FOOT - 2 VIEW COMPARISON:  02/22/2017 FINDINGS: Acute comminuted left ankle fracture dislocation noted. Soft tissue swelling. Bones are osteopenic. Chronic fusion of the left first MTP joint. No other acute fracture involving the foot. IMPRESSION: Acute comminuted left ankle fracture dislocation. Chronic left first MTP joint ankylosis. Electronically Signed   By: Jerilynn Mages.  Shick M.D.   On: 02/22/2017 18:09    EKG: Independently reviewed.  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 Fracture dislocation of L ankle joint - 1. To OR now with Dr. Percell Miller 2. Dilaudid PRN pain 2. CAD with chronic chest pain - 1. S/p 4x CABG in Oct 2016 1. Also had tissue AVR at that time too 2. Preserved EF of 55-60%, AVR looked good on echo post CABG 3. Most recently for chronic chest pain had stress test 4 months ago that was normal and low risk, EF 53% 3. Chronic Diastolic CHF - 1. Preserved EF 2. Holding lasix for now given pending surgery 3. Possibly resume tomorrow once he is taking POs 4. HTN - continue home meds in AM 5. OSA - continue CPAP at night 6. DM2 diet controlled - CBG checks AC/HS  DVT prophylaxis: SCDs - chemo prophylaxis start delayed till after surgery when okayed by Ortho. Code Status: Full Family Communication: Wife at bedside Disposition Plan: TBD Consults called: Dr. Percell Miller is taking patient to OR now Admission status: Admit to inpatient   [place], [place] [facility] Pager [phone]  If 7AM-7PM, please contact day team taking care  of patient [email or web address] Password [facility]  02/22/2017, 8:17 PM

## 2017-02-22 NOTE — ED Provider Notes (Signed)
[facility] DEPT Provider Note   CSN: [id number] Arrival date & time: 02/22/17  1617     History   Chief Complaint Chief Complaint  Patient presents with  . Fall    HPI Troy Banks is a 76 y.o. male.  HPI Patient fell approximately 12 feet off of a roof of cleaning gutters one hour prior to coming here. He complains of left ankle painand low back pain since the event.[name] He landed on his feet did not lose consciousness. Treated by EMS with splint on left lower extremity and hard cervical collar. Nothing makes symptoms better or worse. Pain is severe. Past Medical History:  Diagnosis Date  . Allergy   . Anxiety    PTSD  . Aortic stenosis, moderate 03/27/2015   post AVR echo Echo 11/16: Mild LVH, EF 55-60%, normal wall motion, grade 1 diastolic dysfunction, bioprosthetic AVR okay (mean gradient 18 mmHg) with trivial AI, mild LAE, atrial septal aneurysm, small effusion   . Arthritis    "qwhere" (10/29/2015)  . Carotid artery stenosis    Carotid US 11/17: 1-39% bilateral ICA stenosis. F/u prn  . Cervical stenosis of spinal canal    fusion 2006  . CHF (congestive heart failure) ([place])    "secondry to OHS"  . COLONIC POLYPS, HX OF   . Complication of anesthesia    "takes a lot to put him under and has woken up during surgery" Difficult to wake up . PTSD; "does not metabolize RX well"  . DIVERTICULOSIS, COLON   . Dysrhythmia    2 bouts of afib post op and at home but corrected with medication.  . Familial tremor 03/19/2014  . GERD (gastroesophageal reflux disease)    uses Zantac  . GOUT   . Headache   . Heart murmur   . High cholesterol   . History of kidney stones   . HYPERTENSION    off ACEI 2010 because of hyperkalemia in setting of ARI  . Hypothyroidism    newly diaganosed 09/2015  . Kidney stones    remote open stone-retrieval on L  . Myocardial infarction [facility])    October 20th 2016  . OSA on CPAP   . 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 PTSD (post-traumatic stress disorder)   . Sleep apnea    On a CPAP    Patient Active Problem List   Diagnosis Date Noted  . Renal stone 10/26/2016  . Gross hematuria 10/26/2016  . Bladder neck obstruction 10/26/2016  . External hemorrhoid, thrombosed 06/10/2016  . Internal hemorrhoids 06/10/2016  . Lumbar back pain 05/19/2016  . Osteoarthritis of right hip 10/27/2015  . Status post total replacement of right hip 10/27/2015  . Chronic chest pain 09/22/2015  . Acute gouty arthritis 06/11/2015  . CAD (coronary artery disease), native coronary artery 06/11/2015  . Chronic diastolic CHF (congestive heart failure) ([place]) 06/11/2015  . H/O tissue AVR Oct 2016 06/10/2015  . PAF post CABG/AVR- Amiodarone 06/10/2015  . Dyslipidemia 06/10/2015  . S/P CABG x 4-Oct 2016 03/30/2015  . H/O NSTEMI-Oct 2016 03/27/2015  . 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 ([place])   . Depression 09/08/2014  . GERD (gastroesophageal reflux disease)   . Essential tremor 03/31/2014  . Iliotibial band syndrome affecting left lower leg 01/01/2014  . Spondylolisthesis at L4-L5 level 01/01/2014  . Sinus bradycardia 12/31/2013  . Greater trochanteric bursitis of right hip 12/04/2013  .  OSA on CPAP   . Diet-controlled diabetes mellitus ([place]) 07/08/2009  . COLONIC POLYPS, HX OF 12/31/2008  . GOUT 12/22/2008  . Essential hypertension 12/22/2008  . Diverticulosis of colon 12/22/2008  . NEPHROLITHIASIS, HX OF 12/22/2008    Past Surgical History:  Procedure Laterality Date  . ANTERIOR FUSION CERVICAL SPINE  2002  . AORTIC VALVE REPLACEMENT N/A 03/30/2015   Procedure: AORTIC VALVE REPLACEMENT (AVR);  Surgeon: Grace Isaac, MD;  Location: [facility];  Service: Open Heart Surgery;  Laterality: N/A;  . BACK SURGERY    . CARDIAC CATHETERIZATION N/A 03/26/2015   Procedure: Left Heart Cath and Coronary Angiography;  Surgeon: Leonie Man, MD;   Location: [facility] CV LAB;  Service: Cardiovascular;  Laterality: N/A;  . CARDIAC VALVE REPLACEMENT    . CORONARY ARTERY BYPASS GRAFT N/A 03/30/2015   Procedure: CORONARY ARTERY BYPASS GRAFTING (CABG) x four, using left internal mammary artery and left    leg greater saphenous vein harvested endoscopically ;  Surgeon: Grace Isaac, MD;  Location: [facility];  Service: Open Heart Surgery;  Laterality: N/A;  . CYSTOSCOPY/URETEROSCOPY/HOLMIUM LASER/STENT PLACEMENT Bilateral 11/07/2016   Procedure: CYSTOSCOPY/URETEROSCOPY/HOLMIUM LASER/STENT PLACEMENT;  Surgeon: Nickie Retort, MD;  Location: [facility];  Service: Urology;  Laterality: Bilateral;  . CYSTOSCOPY/URETEROSCOPY/HOLMIUM LASER/STENT PLACEMENT Bilateral 11/23/2016   Procedure: CYSTOSCOPY/BILATERAL URETEROSCOPY/HOLMIUM LASER/STENT EXCHANGE;  Surgeon: Nickie Retort, MD;  Location: [facility];  Service: Urology;  Laterality: Bilateral;  NEEDS 90 MIN   . HEMORRHOID BANDING  1970s  . INGUINAL HERNIA REPAIR Left 1962  . JOINT REPLACEMENT    . [place]   "cut me open"  . POSTERIOR FUSION CERVICAL SPINE  2006  . TEE WITHOUT CARDIOVERSION N/A 03/30/2015   Procedure: TRANSESOPHAGEAL ECHOCARDIOGRAM (TEE);  Surgeon: Grace Isaac, MD;  Location: [facility];  Service: Open Heart Surgery;  Laterality: N/A;  . TESTICLE TORSION REDUCTION Right 1960  . TOE FUSION Left 1985 & 2004   "great toe"  . TONSILLECTOMY    . TOTAL HIP ARTHROPLASTY Right 10/27/2015   Procedure: RIGHT TOTAL HIP ARTHROPLASTY ANTERIOR APPROACH and steroid injection right foot;  Surgeon: Mcarthur Rossetti, MD;  Location: [facility];  Service: Orthopedics;  Laterality: Right;       Home Medications    Prior to Admission medications   Medication Sig Start Date End Date Taking? Authorizing Provider  allopurinol (ZYLOPRIM) 300 MG tablet Take 1 tablet (300 mg total) by mouth daily. 08/26/16   [name], MD  amLODipine (NORVASC) 10 MG tablet TAKE 1 TABLET BY  MOUTH EVERY DAY 12/15/16   [name], MD  Ascorbic Acid (VITAMIN C) 1000 MG tablet Take 1,000 mg by mouth 2 (two) times daily.     [provider]  aspirin EC 81 MG tablet Take 81 mg by mouth daily.    [provider]  blood glucose meter kit and supplies KIT Use monitor to check blood sugar twice a day. DX E11.09 09/29/14   Rowe Clack, MD  Blood Glucose Monitoring Suppl (ONE TOUCH ULTRA 2) W/DEVICE KIT Test up to two times daily 09/29/14   Rowe Clack, MD  Carboxymethylcellulose Sodium (LUBRICANT EYE DROPS OP) Place 1 drop into both eyes at bedtime.    [provider]  cephALEXin (KEFLEX) 500 MG capsule Take 1 capsule (500 mg total) by mouth 3 (three) times daily. 11/23/16   Nickie Retort, MD  cholecalciferol (VITAMIN D) 1000 units tablet Take 1,000  Units by mouth 2 (two) times daily.     [provider]  furosemide (LASIX) 40 MG tablet TAKE 1 TABLET BY MOUTH EVERY OTHER DAY 07/25/16   [name], [name], MD  glucose blood (ONE TOUCH TEST STRIPS) test strip Test up to two times daily 09/29/14   Rowe Clack, MD  ibuprofen (ADVIL,MOTRIN) 200 MG tablet Take 400-800 mg by mouth every 6 (six) hours as needed (pain).     [provider]  ketorolac (TORADOL) 10 MG tablet Take 1 tablet (10 mg total) by mouth every 6 (six) hours as needed. 11/23/16   Nickie Retort, MD  levothyroxine (SYNTHROID, LEVOTHROID) 25 MCG tablet TAKE 1 TABLET BY MOUTH EVERY DAY BEFORE BREAKFAST 12/19/16   [name], MD  ONE TOUCH LANCETS MISC Test up to two times daily 09/29/14   Rowe Clack, MD  primidone (MYSOLINE) 50 MG tablet 150 mg in the morning, 100 mg in the evening 02/08/17   [name], [name], DO  ranitidine (ZANTAC) 150 MG tablet TAKE 1 TABLET BY MOUTH 2 TIMES DAILY 10/24/16   [name], MD  rosuvastatin (CRESTOR) 10 MG tablet Take 0.5 tablets (5 mg total) by mouth daily. 01/05/16   [name], [name], MD  tiZANidine (ZANAFLEX) 4 MG tablet Take  1 tablet by mouth every 6 hours as needed for muscle spasms. 05/26/16   [name], MD  Turmeric 500 MG CAPS Take 500 mg by mouth 2 (two) times daily.    [provider]  vitamin B-12 (CYANOCOBALAMIN) 1000 MCG tablet Take 1,000 mcg by mouth 2 (two) times daily.     [provider]    Family History Family History  Problem Relation Age of Onset  . Heart disease Mother   . Hypertension Mother   . Diabetes Mother   . Heart disease Father   . Hypertension Father   . Prostate cancer Maternal Uncle   . Hypertension Maternal Grandmother   . Heart disease Maternal Grandmother   . Hypertension Maternal Grandfather   . Heart disease Maternal Grandfather   . Hypertension Paternal Grandfather   . Heart disease Paternal Grandfather   . Diabetes Paternal Grandfather   . Alcohol abuse Other   . Arthritis Other   . Colon polyps Neg Hx   . Esophageal cancer Neg Hx   . Rectal cancer Neg Hx   . Stomach cancer Neg Hx     Social History Social History  Substance Use Topics  . Smoking status: Former Smoker    Packs/day: 1.00    Years: 34.00    Types: Cigarettes    Quit date: 06/06/1990  . Smokeless tobacco: Never Used     Comment: widowed 1991 - lives with SO [name] who is Therapist, sports  . Alcohol use 0.6 oz/week    1 Glasses of wine per week     Comment: occasional     Allergies   Benzodiazepines; Coreg [carvedilol]; Lisinopril; Mobic [meloxicam]; Sertraline hcl; Tramadol; Atorvastatin; Colchicine; Compazine [prochlorperazine maleate]; Flomax [tamsulosin hcl]; Ondansetron; Promethazine; and Acyclovir and related   Review of Systems Review of Systems  Constitutional: Negative.   HENT: Negative.   Respiratory: Negative.   Cardiovascular: Negative.   Gastrointestinal: Negative.   Musculoskeletal: Positive for arthralgias and back pain.  Skin: Negative.   Neurological: Negative.   Psychiatric/Behavioral: Negative.   All other systems reviewed and are  negative.    Physical Exam Updated Vital Signs BP (!) 159/80   Pulse 76  Temp 98.3 F (36.8 C) (Oral)   Resp 19   Ht 5' 8"  (1.727 m)   Wt 92.1 kg (203 lb)   SpO2 94%   BMI 30.87 kg/m   Physical Exam  Constitutional: He is oriented to person, place, and time. He appears well-developed and well-nourished. He appears distressed.  He is uncomfortable Glasgow Coma Score 15  HENT:  Head: Normocephalic and atraumatic.  Eyes: Pupils are equal, round, and reactive to light. Conjunctivae are normal.  Neck: Neck supple. No tracheal deviation present. No thyromegaly present.  Cardiovascular: Normal rate and regular rhythm.   No murmur heard. Pulmonary/Chest: Effort normal and breath sounds normal.  Abdominal: Soft. Bowel sounds are normal. He exhibits no distension. There is no tenderness.  Genitourinary: Penis normal.  Musculoskeletal: Normal range of motion. He exhibits no edema or tenderness.  Left lower extremity skin is intact. Deformityand tenderness at the ankle. Foot mildly swollen.DP pulse 2+ capillary refill all other extremities no contusion abrasion or tenderness neurovascular intact. Pelvis stable nontender. Entire spine nontender.  Neurological: He is alert and oriented to person, place, and time. No cranial nerve deficit. Coordination normal.  Moves all extremities  Skin: Skin is warm and dry. No rash noted.  Psychiatric: He has a normal mood and affect.  Nursing note and vitals reviewed.    ED Treatments / Results  Labs (all labs ordered are listed, but only abnormal results are displayed) Labs Reviewed  BASIC METABOLIC PANEL  CBC WITH DIFFERENTIAL/PLATELET    EKG  EKG Interpretation  Date/Time:  Wednesday February 22 2017 16:47:37 EDT Ventricular Rate:  78 PR Interval:    QRS Duration: 93 QT Interval:  483 QTC Calculation: 551 R Axis:   42 Text Interpretation:  Sinus rhythm Borderline prolonged PR interval Borderline repolarization abnormality Prolonged  QT interval No significant change since last tracing Confirmed by Orlie Dakin [phone]) on 02/22/2017 7:09:59 PM       Radiology No results found.  Procedures .Critical Care Performed by: Orlie Dakin Authorized by: Orlie Dakin   Critical care provider statement:    Critical care was time spent personally by me on the following activities:  Discussions with consultants, evaluation of patient's response to treatment, examination of patient, obtaining history from patient or surrogate, development of treatment plan with patient or surrogate, ordering and review of laboratory studies, ordering and review of radiographic studies, pulse oximetry, re-evaluation of patient's condition and review of old charts   I assumed direction of critical care for this patient from another provider in my specialty: yes   Comments:     45 minutes   (including critical care time)  Medications Ordered in ED Medications  ketorolac (TORADOL) 30 MG/ML injection 30 mg (not administered)   Dr. Durward Fortes was called as patient has relationship with [facility]. Dr. Durward Fortes defers to the pedis on call. I consulted Dr. Percell Miller who will see patient in ED and arrange for surgery tonight. He requested that I consult hospitalist for medical clearance. I consulted Dr. Alcario Drought from the hospitalist service who will see patient inthe ED prior to surgery.  x-rays viewed by me Results for orders placed or performed during the hospital encounter of 97/02/63  Basic metabolic panel  Result Value Ref Range   Sodium 141 135 - 145 mmol/L   Potassium 3.6 3.5 - 5.1 mmol/L   Chloride 108 101 - 111 mmol/L   CO2 24 22 - 32 mmol/L   Glucose, Bld 117 (H) 65 - 99 mg/dL  BUN 14 6 - 20 mg/dL   Creatinine, Ser 0.91 0.61 - 1.24 mg/dL   Calcium 9.0 8.9 - 10.3 mg/dL   GFR calc non Af Amer >60 >60 mL/min   GFR calc Af Amer >60 >60 mL/min   Anion gap 9 5 - 15  CBC with Differential/Platelet  Result Value Ref Range    WBC 14.1 (H) 4.0 - 10.5 K/uL   RBC 4.85 4.22 - 5.81 MIL/uL   Hemoglobin 14.6 13.0 - 17.0 g/dL   HCT 43.1 39.0 - 52.0 %   MCV 88.9 78.0 - 100.0 fL   MCH 30.1 26.0 - 34.0 pg   MCHC 33.9 30.0 - 36.0 g/dL   RDW 13.8 11.5 - 15.5 %   Platelets 149 (L) 150 - 400 K/uL   Neutrophils Relative % 82 %   Neutro Abs 11.4 (H) 1.7 - 7.7 K/uL   Lymphocytes Relative 10 %   Lymphs Abs 1.4 0.7 - 4.0 K/uL   Monocytes Relative 8 %   Monocytes Absolute 1.2 (H) 0.1 - 1.0 K/uL   Eosinophils Relative 0 %   Eosinophils Absolute 0.1 0.0 - 0.7 K/uL   Basophils Relative 0 %   Basophils Absolute 0.0 0.0 - 0.1 K/uL   Dg Chest 1 View  Result Date: 02/22/2017 CLINICAL DATA:  Fall off roof, preop EXAM: CHEST 1 VIEW COMPARISON:  10/27/2016 FINDINGS: Lungs are clear.  No pleural effusion or pneumothorax. The heart is normal in size. Prosthetic valve. Postsurgical changes related to prior CABG. Median sternotomy. IMPRESSION: No evidence of acute cardiopulmonary disease. Electronically Signed   By: Julian Hy M.D.   On: 02/22/2017 20:18   Dg Lumbar Spine Complete  Result Date: 02/22/2017 CLINICAL DATA:  Fall off roof EXAM: LUMBAR SPINE - COMPLETE 4+ VIEW COMPARISON:  CT abdomen/ pelvis dated 10/28/2016 FINDINGS: Mild superior endplate compression fracture deformity at L2, with approximately 20% loss of height. No retropulsion or involvement of the posterior elements. Mild degenerative changes. Visualized bony pelvis appears intact. Vascular calcifications. IMPRESSION: Mild superior endplate compression fracture deformity at L2 with approximately 20% loss of height. No retropulsion. Electronically Signed   By: Julian Hy M.D.   On: 02/22/2017 20:18   Dg Pelvis 1-2 Views  Result Date: 02/22/2017 CLINICAL DATA:  [place] off a 15 foot roof today.  Low back pain. EXAM: PELVIS - 1-2 VIEW COMPARISON:  10/18/2016 FINDINGS: Again noted is a right hip replacement which appears to be grossly intact. The pelvic bony ring is  intact. Evidence for facet arthropathy in the lower lumbar spine. Right femoral stem is not completely visualized. No gross abnormality to the left hip. IMPRESSION: No acute abnormality to the pelvis. Electronically Signed   By: Markus Daft M.D.   On: 02/22/2017 20:17   Dg Ankle Left Port  Result Date: 02/22/2017 CLINICAL DATA:  Pain following fall EXAM: PORTABLE LEFT ANKLE - 2 VIEW COMPARISON:  None. FINDINGS: Frontal and lateral views were obtained it. There is a comminuted fracture of the distal tibia with displaced fracture fragments distally. Fracture extends to the tibial plafond with gross ankle mortise disruption. There is avulsion of the medial malleolus. Fibula does not appear fractured. There is associated soft tissue swelling. There are small calcaneal spurs. IMPRESSION: Comminuted fracture of the distal tibia extending from the diaphysis seal region through the metaphysis and into the plafond. There is avulsion of the medial malleolus from the remainder of the tibia. There is gross ankle mortise disruption. Fibula appears intact. Electronically Signed  By: Lowella Grip III M.D.   On: 02/22/2017 18:08   Dg Foot 2 Views Left  Result Date: 02/22/2017 CLINICAL DATA:  Fall from ladder, left ankle comminuted fracture dislocation EXAM: LEFT FOOT - 2 VIEW COMPARISON:  02/22/2017 FINDINGS: Acute comminuted left ankle fracture dislocation noted. Soft tissue swelling. Bones are osteopenic. Chronic fusion of the left first MTP joint. No other acute fracture involving the foot. IMPRESSION: Acute comminuted left ankle fracture dislocation. Chronic left first MTP joint ankylosis. Electronically Signed   By: Jerilynn Mages.  Shick M.D.   On: 02/22/2017 18:09   Initial Impression / Assessment and Plan / ED Course  I have reviewed the triage vital signs and the nursing notes.  Pertinent labs & imaging results that were available during my care of the patient were reviewed by me and considered in my medical decision  making (see chart for details).     Cervical spine cleared by Nexus criteria Patient initially received Toradol 30 no grams IV at 1715 p.m. As he and his wife say that he has prolonged nausea with opioid pain medicine and requested Toradol. At 1840 p.m. Requesting more pain medicine intravenous hydromorphone ordered. At 19 10 PM he continues to complain of severe left ankle pain. Additional IV hydromorphone ordered. Dr. Percell Miller took patient to the operating room Final Clinical Impressions(s) / ED Diagnoses  Diagnosis #1 closed fracture dislocation of left ankle #2 lumbar compression fracture #3 fall Final diagnoses:  None    New Prescriptions New Prescriptions   No medications on file     Orlie Dakin, MD 02/22/17 2052

## 2017-02-22 NOTE — Anesthesia Procedure Notes (Signed)
Procedure Name: LMA Insertion Date/Time: 02/22/2017 9:04 PM Performed by: Manuela Schwartz B Pre-anesthesia Checklist: Patient identified, Emergency Drugs available, Suction available and Patient being monitored Patient Re-evaluated:Patient Re-evaluated prior to induction Oxygen Delivery Method: Circle System Utilized Preoxygenation: Pre-oxygenation with 100% oxygen Induction Type: IV induction Ventilation: Mask ventilation without difficulty LMA: LMA inserted LMA Size: 4.0 Number of attempts: 1 Airway Equipment and Method: Bite block Placement Confirmation: positive ETCO2 Tube secured with: Tape Dental Injury: Teeth and Oropharynx as per pre-operative assessment

## 2017-02-22 NOTE — Consult Note (Signed)
ORTHOPAEDIC CONSULTATION  REQUESTING PHYSICIAN: Etta Quill, DO  Chief Complaint: left ankle injury  HPI: Troy Banks is a 76 y.o. male who complains of a fall from a ladder while cleaning gutters  Past Medical History:  Diagnosis Date  . Allergy   . Anxiety    PTSD  . Aortic stenosis, moderate 03/27/2015   post AVR echo Echo 11/16: Mild LVH, EF 55-60%, normal wall motion, grade 1 diastolic dysfunction, bioprosthetic AVR okay (mean gradient 18 mmHg) with trivial AI, mild LAE, atrial septal aneurysm, small effusion   . Arthritis    "qwhere" (10/29/2015)  . Carotid artery stenosis    Carotid US 11/17: 1-39% bilateral ICA stenosis. F/u prn  . Cervical stenosis of spinal canal    fusion 2006  . CHF (congestive heart failure) ([place])    "secondry to OHS"  . COLONIC POLYPS, HX OF   . Complication of anesthesia    "takes a lot to put him under and has woken up during surgery" Difficult to wake up . PTSD; "does not metabolize RX well"  . DIVERTICULOSIS, COLON   . Dysrhythmia    2 bouts of afib post op and at home but corrected with medication.  . Familial tremor 03/19/2014  . GERD (gastroesophageal reflux disease)    uses Zantac  . GOUT   . Headache   . Heart murmur   . High cholesterol   . History of kidney stones   . HYPERTENSION    off ACEI 2010 because of hyperkalemia in setting of ARI  . Hypothyroidism    newly diaganosed 09/2015  . Kidney stones    remote open stone-retrieval on L  . Myocardial infarction [facility])    October 20th 2016  . OSA on CPAP   . 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 PTSD (post-traumatic stress disorder)   . Sleep apnea    On a CPAP   Past Surgical History:  Procedure Laterality Date  . ANTERIOR FUSION CERVICAL SPINE  2002  . AORTIC VALVE REPLACEMENT N/A 03/30/2015   Procedure: AORTIC VALVE REPLACEMENT (AVR);  Surgeon: Grace Isaac, MD;  Location: [facility];  Service: Open Heart Surgery;  Laterality: N/A;  . BACK SURGERY    . CARDIAC CATHETERIZATION N/A 03/26/2015   Procedure: Left Heart Cath and Coronary Angiography;  Surgeon: Leonie Man, MD;  Location: [facility];  Service: Cardiovascular;  Laterality: N/A;  . CARDIAC VALVE REPLACEMENT    . CORONARY ARTERY BYPASS GRAFT N/A 03/30/2015   Procedure: CORONARY ARTERY BYPASS GRAFTING (CABG) x four, using left internal mammary artery and left    leg greater saphenous vein harvested endoscopically ;  Surgeon: Grace Isaac, MD;  Location: [facility];  Service: Open Heart Surgery;  Laterality: N/A;  . CYSTOSCOPY/URETEROSCOPY/HOLMIUM LASER/STENT PLACEMENT Bilateral 11/07/2016   Procedure: CYSTOSCOPY/URETEROSCOPY/HOLMIUM LASER/STENT PLACEMENT;  Surgeon: Nickie Retort, MD;  Location: [facility];  Service: Urology;  Laterality: Bilateral;  . CYSTOSCOPY/URETEROSCOPY/HOLMIUM LASER/STENT PLACEMENT Bilateral 11/23/2016   Procedure: CYSTOSCOPY/BILATERAL URETEROSCOPY/HOLMIUM LASER/STENT EXCHANGE;  Surgeon: Nickie Retort, MD;  Location: [facility];  Service: Urology;  Laterality: Bilateral;  NEEDS 90 MIN   . HEMORRHOID BANDING  1970s  . INGUINAL HERNIA REPAIR Left 1962  . JOINT REPLACEMENT    . [place]   "cut me open"  . POSTERIOR FUSION CERVICAL SPINE  2006  . TEE WITHOUT CARDIOVERSION  N/A 03/30/2015   Procedure: TRANSESOPHAGEAL ECHOCARDIOGRAM (TEE);  Surgeon: Grace Isaac, MD;  Location: [facility];  Service: Open Heart Surgery;  Laterality: N/A;  . TESTICLE TORSION REDUCTION Right 1960  . TOE FUSION Left 1985 & 2004   "great toe"  . TONSILLECTOMY    . TOTAL HIP ARTHROPLASTY Right 10/27/2015   Procedure: RIGHT TOTAL HIP ARTHROPLASTY ANTERIOR APPROACH and steroid injection right foot;  Surgeon: Mcarthur Rossetti, MD;  Location: [facility];  Service: Orthopedics;  Laterality: Right;   Social History   Social History  . Marital status: Single    Spouse name: Karmen Bongo,  RN  . Number of children: 2  . Years of education: N/A   Occupational History  . Retired Animal nutritionist    Social History Main Topics  . Smoking status: Former Smoker    Packs/day: 1.00    Years: 34.00    Types: Cigarettes    Quit date: 06/06/1990  . Smokeless tobacco: Never Used     Comment: widowed 1991 - lives with SO [name] who is Therapist, sports  . Alcohol use 0.6 oz/week    1 Glasses of wine per week     Comment: occasional  . Drug use: No  . Sexual activity: Yes   Other Topics Concern  . None   Social History Narrative  . None   Family History  Problem Relation Age of Onset  . Heart disease Mother   . Hypertension Mother   . Diabetes Mother   . Heart disease Father   . Hypertension Father   . Prostate cancer Maternal Uncle   . Hypertension Maternal Grandmother   . Heart disease Maternal Grandmother   . Hypertension Maternal Grandfather   . Heart disease Maternal Grandfather   . Hypertension Paternal Grandfather   . Heart disease Paternal Grandfather   . Diabetes Paternal Grandfather   . Alcohol abuse Other   . Arthritis Other   . Colon polyps Neg Hx   . Esophageal cancer Neg Hx   . Rectal cancer Neg Hx   . Stomach cancer Neg Hx    Allergies  Allergen Reactions  . Benzodiazepines Other (See Comments)    Patient states he gets stroke symptoms with benzo's.   . Coreg [Carvedilol] Other (See Comments)    Accumulates and causes stroke-like side-effects due to 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 Patient poor metabolizer of CYP2D6 - Coreg undergoes extensive hepatic (including 2D6)  . Lisinopril Other (See Comments)    Hyperkalemia. 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  . Mobic [Meloxicam] Other (See Comments)    Accumulates and causes stroke-like side-effects due to 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  . Sertraline Hcl Other (See Comments)    Accumulates and causes stroke-like side-effects due to cytochrome P450  enzyme deficiency  . Tramadol Other (See Comments)    Accumulates and causes stroke-like side-effects due to 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  . Atorvastatin Other (See Comments)    MYALGIAS  . Colchicine Other (See Comments)    ataxia  . Compazine [Prochlorperazine Maleate] Other (See Comments)    Accumulates and causes stroke-like side-effects due to 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  . Flomax [Tamsulosin Hcl]     Accumulates and causes stroke-like side-effects due to 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  . Ondansetron Other (See Comments)    Accumulates and causes stroke-like side-effects due to 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   . Promethazine Other (See Comments)    Accumulates and causes stroke-like side-effects due  to 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  . Acyclovir And Related Other (See Comments)    Accumulates and causes stroke-like side-effects due to 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   Prior to Admission medications   Medication Sig Start Date End Date Taking? Authorizing Provider  allopurinol (ZYLOPRIM) 300 MG tablet Take 1 tablet (300 mg total) by mouth daily. 08/26/16  Yes [name], MD  amLODipine (NORVASC) 10 MG tablet TAKE 1 TABLET BY MOUTH EVERY DAY Patient taking differently: TAKE 10MG BY MOUTH EVERY DAY 12/15/16  Yes [name], MD  Ascorbic Acid (VITAMIN C) 1000 MG tablet Take 1,000 mg by mouth 2 (two) times daily.    Yes [provider]  aspirin EC 81 MG tablet Take 81 mg by mouth daily.   Yes [provider]  Carboxymethylcellulose Sodium (LUBRICANT EYE DROPS OP) Place 1 drop into both eyes at bedtime.   Yes [provider]  cholecalciferol (VITAMIN D) 1000 units tablet Take 1,000 Units by mouth 2 (two) times daily.    Yes [provider]  furosemide (LASIX) 40 MG tablet TAKE 1 TABLET BY MOUTH EVERY OTHER DAY Patient taking differently: TAKE 40MG BY MOUTH EVERY OTHER DAY 07/25/16  Yes [name], [name], MD  ibuprofen  (ADVIL,MOTRIN) 200 MG tablet Take 400-800 mg by mouth every 6 (six) hours as needed (pain).    Yes [provider]  ketorolac (TORADOL) 10 MG tablet Take 1 tablet (10 mg total) by mouth every 6 (six) hours as needed. 11/23/16  Yes Nickie Retort, MD  levothyroxine (SYNTHROID, LEVOTHROID) 25 MCG tablet TAKE 1 TABLET BY MOUTH EVERY DAY BEFORE BREAKFAST Patient taking differently: TAKE 25MG BY MOUTH EVERY DAY BEFORE BREAKFAST 12/19/16  Yes [name], MD  primidone (MYSOLINE) 50 MG tablet 150 mg in the morning, 100 mg in the evening 02/08/17  Yes [name], [name], DO  ranitidine (ZANTAC) 150 MG tablet TAKE 1 TABLET BY MOUTH 2 TIMES DAILY 10/24/16  Yes [name], MD  rosuvastatin (CRESTOR) 10 MG tablet Take 0.5 tablets (5 mg total) by mouth daily. 01/05/16  Yes [name], [name], MD  tiZANidine (ZANAFLEX) 4 MG tablet Take 1 tablet by mouth every 6 hours as needed for muscle spasms. 05/26/16  Yes [name], MD  Turmeric 500 MG CAPS Take 500 mg by mouth 2 (two) times daily.   Yes [provider]  vitamin B-12 (CYANOCOBALAMIN) 1000 MCG tablet Take 1,000 mcg by mouth 2 (two) times daily.    Yes [provider]  blood glucose meter kit and supplies KIT Use monitor to check blood sugar twice a day. DX E11.09 09/29/14   Rowe Clack, MD  Blood Glucose Monitoring Suppl (ONE TOUCH ULTRA 2) W/DEVICE KIT Test up to two times daily 09/29/14   Rowe Clack, MD  glucose blood (ONE TOUCH TEST STRIPS) test strip Test up to two times daily 09/29/14   Rowe Clack, MD  ONE TOUCH LANCETS MISC Test up to two times daily 09/29/14   Rowe Clack, MD   Dg Chest 1 View  Result Date: 02/22/2017 CLINICAL DATA:  Fall off roof, preop EXAM: CHEST 1 VIEW COMPARISON:  10/27/2016 FINDINGS: Lungs are clear.  No pleural effusion or pneumothorax. The heart is normal in size. Prosthetic valve. Postsurgical changes related to prior CABG. Median sternotomy. IMPRESSION: No evidence of  acute cardiopulmonary disease. Electronically Signed   By: Julian Hy M.D.   On: 02/22/2017 20:18   Dg Lumbar  Spine Complete  Result Date: 02/22/2017 CLINICAL DATA:  Fall off roof EXAM: LUMBAR SPINE - COMPLETE 4+ VIEW COMPARISON:  CT abdomen/ pelvis dated 10/28/2016 FINDINGS: Mild superior endplate compression fracture deformity at L2, with approximately 20% loss of height. No retropulsion or involvement of the posterior elements. Mild degenerative changes. Visualized bony pelvis appears intact. Vascular calcifications. IMPRESSION: Mild superior endplate compression fracture deformity at L2 with approximately 20% loss of height. No retropulsion. Electronically Signed   By: Julian Hy M.D.   On: 02/22/2017 20:18   Dg Pelvis 1-2 Views  Result Date: 02/22/2017 CLINICAL DATA:  [place] off a 15 foot roof today.  Low back pain. EXAM: PELVIS - 1-2 VIEW COMPARISON:  10/18/2016 FINDINGS: Again noted is a right hip replacement which appears to be grossly intact. The pelvic bony ring is intact. Evidence for facet arthropathy in the lower lumbar spine. Right femoral stem is not completely visualized. No gross abnormality to the left hip. IMPRESSION: No acute abnormality to the pelvis. Electronically Signed   By: Markus Daft M.D.   On: 02/22/2017 20:17   Dg Ankle Left Port  Result Date: 02/22/2017 CLINICAL DATA:  Pain following fall EXAM: PORTABLE LEFT ANKLE - 2 VIEW COMPARISON:  None. FINDINGS: Frontal and lateral views were obtained it. There is a comminuted fracture of the distal tibia with displaced fracture fragments distally. Fracture extends to the tibial plafond with gross ankle mortise disruption. There is avulsion of the medial malleolus. Fibula does not appear fractured. There is associated soft tissue swelling. There are small calcaneal spurs. IMPRESSION: Comminuted fracture of the distal tibia extending from the diaphysis seal region through the metaphysis and into the plafond. There is  avulsion of the medial malleolus from the remainder of the tibia. There is gross ankle mortise disruption. Fibula appears intact. Electronically Signed   By: Lowella Grip III M.D.   On: 02/22/2017 18:08   Dg Foot 2 Views Left  Result Date: 02/22/2017 CLINICAL DATA:  Fall from ladder, left ankle comminuted fracture dislocation EXAM: LEFT FOOT - 2 VIEW COMPARISON:  02/22/2017 FINDINGS: Acute comminuted left ankle fracture dislocation noted. Soft tissue swelling. Bones are osteopenic. Chronic fusion of the left first MTP joint. No other acute fracture involving the foot. IMPRESSION: Acute comminuted left ankle fracture dislocation. Chronic left first MTP joint ankylosis. Electronically Signed   By: Jerilynn Mages.  Shick M.D.   On: 02/22/2017 18:09    Positive ROS: All other systems have been reviewed and were otherwise negative with the exception of those mentioned in the HPI and as above.  Labs cbc  Recent Labs  02/22/17 1836  WBC 14.1*  HGB 14.6  HCT 43.1  PLT 149*    Labs inflam No results for input(s): CRP in the last 72 hours.  Invalid input(s): ESR  Labs coag No results for input(s): INR, PTT in the last 72 hours.  Invalid input(s): PT   Recent Labs  02/22/17 1836  NA 141  K 3.6  CL 108  CO2 24  GLUCOSE 117*  BUN 14  CREATININE 0.91  CALCIUM 9.0    Physical Exam: Vitals:   02/22/17 1815 02/22/17 1900  BP: (!) 172/83 (!) 164/83  Pulse: 72 66  Resp: 16 15  Temp:    SpO2: 97% 92%   General: Alert, no acute distress Cardiovascular: No pedal edema Respiratory: No cyanosis, no use of accessory musculature GI: No organomegaly, abdomen is soft and non-tender Skin: No lesions in the area of chief complaint other  than those listed below in MSK exam.  Neurologic: Sensation intact distally save for the below mentioned MSK exam Psychiatric: Patient is competent for consent with normal mood and affect Lymphatic: No axillary or cervical lymphadenopathy  MUSCULOSKELETAL:    LLE: compartments soft, decreased sensation in all nerve distribution most dense on the plantar foot. +EHL/FHL, TA limited by pain. WWP Other extremities are atraumatic with painless ROM and NVI.  Assessment: L pilon fx  Plan: OR today for Ex-fix NWB LLE for the next 8wks likely  Neuro c/s for Compression fx.    Renette Butters, MD Cell [phone]   02/22/2017 8:32 PM

## 2017-02-22 NOTE — Anesthesia Preprocedure Evaluation (Addendum)
Anesthesia Evaluation  Patient identified by MRN, date of birth, ID band Patient awake    Reviewed: Allergy & Precautions, NPO status , Patient's Chart, lab work & pertinent test results  History of Anesthesia Complications (+) PROLONGED EMERGENCE and Emergence Delirium  Airway Mallampati: II  TM Distance: >3 FB Neck ROM: Full    Dental  (+) Chipped, Dental Advisory Given   Pulmonary sleep apnea and Continuous Positive Airway Pressure Ventilation , former smoker (quit 1992),    breath sounds clear to auscultation       Cardiovascular hypertension, Pt. on medications (-) angina+ CAD, + Past MI and + CABG  + dysrhythmias Atrial Fibrillation  Rhythm:Regular Rate:Normal  5/18 stress:  normal. Good perfusion and normal EF 11/16: Mild LVH, EF 55-60%, normal wall motion, grade 1 diastolic dysfunction, bioprosthetic AVR okay (mean gradient 18 mmHg) with trivial AI,    Neuro/Psych  Headaches, PSYCHIATRIC DISORDERS (PTSD) Anxiety Depression S/p cervical fusion    GI/Hepatic Neg liver ROS, GERD  Controlled and Medicated,  Endo/Other  Hypothyroidism   Renal/GU negative Renal ROS     Musculoskeletal  (+) Arthritis , Osteoarthritis,    Abdominal (+) + obese,   Peds  Hematology negative hematology ROS (+)   Anesthesia Other Findings Heterozygous positive for the CYP2D6*4 Null Variant: altered P450 activity   Reproductive/Obstetrics                           Anesthesia Physical Anesthesia Plan  ASA: III  Anesthesia Plan: General   Post-op Pain Management:    Induction: Intravenous  PONV Risk Score and Plan: 3 and Treatment may vary due to age or medical condition  Airway Management Planned: LMA  Additional Equipment:   Intra-op Plan:   Post-operative Plan:   Informed Consent: I have reviewed the patients History and Physical, chart, labs and discussed the procedure including the risks,  benefits and alternatives for the proposed anesthesia with the patient or authorized representative who has indicated his/her understanding and acceptance.   Dental advisory given  Plan Discussed with: CRNA and Surgeon  Anesthesia Plan Comments: (Plan routine monitors, GA- LMA OK Pt declines prophylactic nausea meds)        Anesthesia Quick Evaluation

## 2017-02-22 NOTE — ED Notes (Signed)
Patient's fluids stopped for 15 min so blood can be drawn. Upon returning to room patient family member states she "is a Therapist, sports and started his fluids back"... This RN explained rationale to stopping fluids. Patient family member goes on to request cathter because patient can't pee laying down , family member made aware of risk of catheter without a need. MD aware.

## 2017-02-22 NOTE — Anesthesia Postprocedure Evaluation (Signed)
Anesthesia Post Note  Patient: Troy Banks  Procedure(s) Performed: Procedure(s) (LRB): EXTERNAL FIXATION LEFT ANKLE (Left)     Patient location during evaluation: PACU Anesthesia Type: General Level of consciousness: awake and alert, patient cooperative and oriented Pain management: pain level controlled Vital Signs Assessment: post-procedure vital signs reviewed and stable Respiratory status: spontaneous breathing, nonlabored ventilation, respiratory function stable and patient connected to nasal cannula oxygen Cardiovascular status: blood pressure returned to baseline and stable Postop Assessment: no apparent nausea or vomiting Anesthetic complications: no    Last Vitals:  Vitals:   02/22/17 2225 02/22/17 2240  BP: (!) 169/74 (!) 146/74  Pulse: 74 69  Resp: 10 19  Temp:    SpO2: 97% 95%    Last Pain:  Vitals:   02/22/17 2225  TempSrc:   PainSc: 5                  Ulis Kaps,E. Malaya Cagley

## 2017-02-23 ENCOUNTER — Inpatient Hospital Stay (HOSPITAL_COMMUNITY): Payer: Medicare Other

## 2017-02-23 DIAGNOSIS — E119 Type 2 diabetes mellitus without complications: Secondary | ICD-10-CM

## 2017-02-23 DIAGNOSIS — S82892A Other fracture of left lower leg, initial encounter for closed fracture: Secondary | ICD-10-CM

## 2017-02-23 DIAGNOSIS — I2510 Atherosclerotic heart disease of native coronary artery without angina pectoris: Secondary | ICD-10-CM

## 2017-02-23 DIAGNOSIS — I5032 Chronic diastolic (congestive) heart failure: Secondary | ICD-10-CM

## 2017-02-23 LAB — GLUCOSE, CAPILLARY
GLUCOSE-CAPILLARY: 133 mg/dL — AB (ref 65–99)
GLUCOSE-CAPILLARY: 115 mg/dL — AB (ref 65–99)
GLUCOSE-CAPILLARY: 141 mg/dL — AB (ref 65–99)
GLUCOSE-CAPILLARY: 112 mg/dL — AB (ref 65–99)

## 2017-02-23 LAB — SURGICAL PCR SCREEN
MRSA, PCR: NEGATIVE
Staphylococcus aureus: POSITIVE — AB

## 2017-02-23 MED ORDER — FUROSEMIDE 40 MG PO TABS
40.0000 mg | ORAL_TABLET | ORAL | Status: DC
  Administered 2017-02-23 – 2017-02-27 (×3): 40 mg via ORAL
  Filled 2017-02-23 (×4): qty 1

## 2017-02-23 NOTE — Progress Notes (Signed)
Pt using his own CPAP from home.  Machine appears clean with no frayed cord.  Wife at bedside with patient and assisting with use of machine.

## 2017-02-23 NOTE — Consult Note (Signed)
Reason for Consult: L2 compression fx Referring Physician: Dr. Brett Albino is an 76 y.o. male.   HPI:  Pleasant 76 year old came in last night after he fell off of his ladder when trying to clean gutters. He denies any LOC and does not remember how he fell. His wife was at home with him when this happened. Denies hitting his head. Complains of appropriate back and leg pain. Dr. Maretta Los took him to the OR last night and placed an ex/fix on his left leg for his ankle fx.   Past Medical History:  Diagnosis Date  . Allergy   . Anxiety    PTSD  . Aortic stenosis, moderate 03/27/2015   post AVR echo Echo 11/16: Mild LVH, EF 55-60%, normal wall motion, grade 1 diastolic dysfunction, bioprosthetic AVR okay (mean gradient 18 mmHg) with trivial AI, mild LAE, atrial septal aneurysm, small effusion   . Arthritis    "qwhere" (10/29/2015)  . Carotid artery stenosis    Carotid US 11/17: 1-39% bilateral ICA stenosis. F/u prn  . Cervical stenosis of spinal canal    fusion 2006  . CHF (congestive heart failure) ([place])    "secondry to OHS"  . COLONIC POLYPS, HX OF   . Complication of anesthesia    "takes a lot to put him under and has woken up during surgery" Difficult to wake up . PTSD; "does not metabolize RX well"  . DIVERTICULOSIS, COLON   . Dysrhythmia    2 bouts of afib post op and at home but corrected with medication.  . Familial tremor 03/19/2014  . GERD (gastroesophageal reflux disease)    uses Zantac  . GOUT   . Headache   . Heart murmur   . High cholesterol   . History of kidney stones   . HYPERTENSION    off ACEI 2010 because of hyperkalemia in setting of ARI  . Hypothyroidism    newly diaganosed 09/2015  . Kidney stones    remote open stone-retrieval on L  . Myocardial infarction [facility])    October 20th 2016  . OSA on CPAP   . 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 PTSD (post-traumatic stress  disorder)   . Sleep apnea    On a CPAP    Past Surgical History:  Procedure Laterality Date  . ANTERIOR FUSION CERVICAL SPINE  2002  . AORTIC VALVE REPLACEMENT N/A 03/30/2015   Procedure: AORTIC VALVE REPLACEMENT (AVR);  Surgeon: Grace Isaac, MD;  Location: [place];  Service: Open Heart Surgery;  Laterality: N/A;  . BACK SURGERY    . CARDIAC CATHETERIZATION N/A 03/26/2015   Procedure: Left Heart Cath and Coronary Angiography;  Surgeon: Leonie Man, MD;  Location: [place] [facility];  Service: Cardiovascular;  Laterality: N/A;  . CARDIAC VALVE REPLACEMENT    . CORONARY ARTERY BYPASS GRAFT N/A 03/30/2015   Procedure: CORONARY ARTERY BYPASS GRAFTING (CABG) x four, using left internal mammary artery and left    leg greater saphenous vein harvested endoscopically ;  Surgeon: Grace Isaac, MD;  Location: [place];  Service: Open Heart Surgery;  Laterality: N/A;  . CYSTOSCOPY/URETEROSCOPY/HOLMIUM LASER/STENT PLACEMENT Bilateral 11/07/2016   Procedure: CYSTOSCOPY/URETEROSCOPY/HOLMIUM LASER/STENT PLACEMENT;  Surgeon: Nickie Retort, MD;  Location: [facility];  Service: Urology;  Laterality: Bilateral;  . CYSTOSCOPY/URETEROSCOPY/HOLMIUM LASER/STENT PLACEMENT Bilateral 11/23/2016   Procedure: CYSTOSCOPY/BILATERAL URETEROSCOPY/HOLMIUM LASER/STENT EXCHANGE;  Surgeon: Nickie Retort, MD;  Location: [facility];  Service: Urology;  Laterality: Bilateral;  NEEDS 90 MIN   . HEMORRHOID BANDING  1970s  . INGUINAL HERNIA REPAIR Left 1962  . JOINT REPLACEMENT    . [place]   "cut me open"  . POSTERIOR FUSION CERVICAL SPINE  2006  . TEE WITHOUT CARDIOVERSION N/A 03/30/2015   Procedure: TRANSESOPHAGEAL ECHOCARDIOGRAM (TEE);  Surgeon: Grace Isaac, MD;  Location: [place];  Service: Open Heart Surgery;  Laterality: N/A;  . TESTICLE TORSION REDUCTION Right 1960  . TOE FUSION Left 1985 & 2004   "great toe"  . TONSILLECTOMY    . TOTAL HIP ARTHROPLASTY Right 10/27/2015   Procedure:  RIGHT TOTAL HIP ARTHROPLASTY ANTERIOR APPROACH and steroid injection right foot;  Surgeon: Mcarthur Rossetti, MD;  Location: [place];  Service: Orthopedics;  Laterality: Right;    Allergies  Allergen Reactions  . Benzodiazepines Other (See Comments)    Patient states he gets stroke symptoms with benzo's.   . Coreg [Carvedilol] Other (See Comments)    Accumulates and causes stroke-like side-effects due to 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 Patient poor metabolizer of CYP2D6 - Coreg undergoes extensive hepatic (including 2D6)  . Lisinopril Other (See Comments)    Hyperkalemia. 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  . Mobic [Meloxicam] Other (See Comments)    Accumulates and causes stroke-like side-effects due to 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  . Sertraline Hcl Other (See Comments)    Accumulates and causes stroke-like side-effects due to 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  . Tramadol Other (See Comments)    Accumulates and causes stroke-like side-effects due to 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  . Atorvastatin Other (See Comments)    MYALGIAS  . Colchicine Other (See Comments)    ataxia  . Compazine [Prochlorperazine Maleate] Other (See Comments)    Accumulates and causes stroke-like side-effects due to 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  . Flomax [Tamsulosin Hcl]     Accumulates and causes stroke-like side-effects due to 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  . Ondansetron Other (See Comments)    Accumulates and causes stroke-like side-effects due to 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   . Promethazine Other (See Comments)    Accumulates and causes stroke-like side-effects due to 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  . Acyclovir And Related Other (See Comments)    Accumulates and causes stroke-like side-effects due to 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    Social History  Substance Use Topics  . Smoking status: Former Smoker     Packs/day: 1.00    Years: 34.00    Types: Cigarettes    Quit date: 06/06/1990  . Smokeless tobacco: Never Used     Comment: widowed 1991 - lives with SO [name] who is Therapist, sports  . Alcohol use 0.6 oz/week    1 Glasses of wine per week     Comment: occasional    Family History  Problem Relation Age of Onset  . Heart disease Mother   . Hypertension Mother   . Diabetes Mother   . Heart disease Father   . Hypertension Father   . Prostate cancer Maternal Uncle   . Hypertension Maternal Grandmother   . Heart disease Maternal Grandmother   . Hypertension Maternal Grandfather   . Heart disease Maternal Grandfather   . Hypertension Paternal Grandfather   . Heart disease Paternal Grandfather   . Diabetes Paternal Grandfather   . Alcohol abuse Other   . Arthritis Other   . Colon polyps Neg Hx   .  Esophageal cancer Neg Hx   . Rectal cancer Neg Hx   . Stomach cancer Neg Hx      Review of Systems  Positive ROS: back pain and left leg pain  All other systems have been reviewed and were otherwise negative with the exception of those mentioned in the HPI and as above.  Objective: Vital signs in last 24 hours: Temp:  [97.5 F (36.4 C)-98.5 F (36.9 C)] 98.5 F (36.9 C) (09/20 0928) Pulse Rate:  [66-91] 69 (09/20 0928) Resp:  [10-24] 18 (09/20 0928) BP: (136-172)/(63-95) 147/63 (09/20 0928) SpO2:  [92 %-99 %] 99 % (09/20 0928) Weight:  [92.1 kg (203 lb)] 92.1 kg (203 lb) (09/19 1627)  General Appearance: Alert, cooperative, no distress, appears stated age Head: Normocephalic, without obvious abnormality, atraumatic Eyes: PERRL, conjunctiva/corneas clear, EOM's intact, fundi benign, both eyes      Ears: Not tested Throat: benign Neck: Supple, symmetrical, trachea midline Back: Symmetric, no curvature, ROM normal Lungs:  respirations unlabored Heart: Regular rate and rhythm Abdomen: not tested Extremities: Extremities normal, atraumatic, no cyanosis or edema Pulses: 2+ and symmetric  all extremities Skin: Skin color, texture, turgor normal, no rashes or lesions  NEUROLOGIC:   Mental status: A&O x4, no aphasia, good attention span, Memory and fund of knowledge Motor Exam - grossly normal, normal tone and bulk Sensory Exam - grossly normal Reflexes: symmetric, no pathologic reflexes, No Hoffman's, No clonus Coordination - grossly normal Gait - uta  Balance - uta Cranial Nerves: I: smell Not tested  II: visual acuity  OS: na  OD:na  II: visual fields Full to confrontation  II: pupils Equal, round, reactive to light  III,VII: ptosis None  III,IV,VI: extraocular muscles  Full ROM  V: mastication Not tested  V: facial light touch sensation  Not tested  V,VII: corneal reflex  Not tested  VII: facial muscle function - upper  Not tested  VII: facial muscle function - lower Not tested  VIII: hearing Not tested  IX: soft palate elevation  Not tested  IX,X: gag reflex Present  XI: trapezius strength  5/5  XI: sternocleidomastoid strength 5/5  XI: neck flexion strength  5/5  XII: tongue strength  Normal    Data Review Lab Results  Component Value Date   WBC 14.1 (H) 02/22/2017   HGB 14.6 02/22/2017   HCT 43.1 02/22/2017   MCV 88.9 02/22/2017   PLT 149 (L) 02/22/2017   Lab Results  Component Value Date   NA 141 02/22/2017   K 3.6 02/22/2017   CL 108 02/22/2017   CO2 24 02/22/2017   BUN 14 02/22/2017   CREATININE 0.91 02/22/2017   GLUCOSE 117 (H) 02/22/2017   Lab Results  Component Value Date   INR 1.47 03/30/2015    Radiology: Dg Chest 1 View  Result Date: 02/22/2017 CLINICAL DATA:  Fall off roof, preop EXAM: CHEST 1 VIEW COMPARISON:  10/27/2016 FINDINGS: Lungs are clear.  No pleural effusion or pneumothorax. The heart is normal in size. Prosthetic valve. Postsurgical changes related to prior CABG. Median sternotomy. IMPRESSION: No evidence of acute cardiopulmonary disease. Electronically Signed   By: Julian Hy M.D.   On: 02/22/2017 20:18    Dg Lumbar Spine Complete  Result Date: 02/22/2017 CLINICAL DATA:  Fall off roof EXAM: LUMBAR SPINE - COMPLETE 4+ VIEW COMPARISON:  CT abdomen/ pelvis dated 10/28/2016 FINDINGS: Mild superior endplate compression fracture deformity at L2, with approximately 20% loss of height. No retropulsion or involvement of the posterior  elements. Mild degenerative changes. Visualized bony pelvis appears intact. Vascular calcifications. IMPRESSION: Mild superior endplate compression fracture deformity at L2 with approximately 20% loss of height. No retropulsion. Electronically Signed   By: Julian Hy M.D.   On: 02/22/2017 20:18   Dg Pelvis 1-2 Views  Result Date: 02/22/2017 CLINICAL DATA:  [place] off a 15 foot roof today.  Low back pain. EXAM: PELVIS - 1-2 VIEW COMPARISON:  10/18/2016 FINDINGS: Again noted is a right hip replacement which appears to be grossly intact. The pelvic bony ring is intact. Evidence for facet arthropathy in the lower lumbar spine. Right femoral stem is not completely visualized. No gross abnormality to the left hip. IMPRESSION: No acute abnormality to the pelvis. Electronically Signed   By: Markus Daft M.D.   On: 02/22/2017 20:17   Dg Ankle 2 Views Left  Result Date: 02/22/2017 CLINICAL DATA:  External Fixation Left Ankle. Fluoro time: 42 sec EXAM: LEFT ANKLE - 2 VIEW; DG C-ARM 61-120 MIN COMPARISON:  Plain film of the left ankle from earlier same day. FINDINGS: Seven intraoperative fluoroscopic images are provided. Final images show significantly improved osseous alignment at the left ankle with symmetric ankle mortise. Fluoroscopy was provided for 42 seconds. IMPRESSION: Intraoperative fluoroscopic images showing significant interval improvement of the osseous alignment at the left ankle. Electronically Signed   By: Franki Cabot M.D.   On: 02/22/2017 22:44   Ct Lumbar Spine Wo Contrast  Result Date: 02/23/2017 CLINICAL DATA:  Fall from ladder EXAM: CT LUMBAR SPINE WITHOUT CONTRAST  TECHNIQUE: Multidetector CT imaging of the lumbar spine was performed without intravenous contrast administration. Multiplanar CT image reconstructions were also generated. COMPARISON:  Lumbar spine radiograph 02/22/2017 FINDINGS: Segmentation: 5 lumbar type vertebrae. Alignment: Grade 1 L4-L5 anterolisthesis secondary to advanced facet hypertrophy. Vertebrae: There is a wedge compression fracture of L2 involving the superior endplate with approximately 25% anterior height loss. No retropulsion. No other vertebral fracture. Paraspinal and other soft tissues: There is atherosclerotic calcification of the aorta and iliac arteries. Calcification in the right adrenal gland. Nonobstructing bilateral renal calculi. Disc levels: T12-L1: Normal disc space and facets. No spinal canal or neuroforaminal stenosis. L1-L2: Small central disc protrusion.  No stenosis. L2-L3: Small disc bulge without stenosis. L3-L4: Severe bilateral facet hypertrophy with mild bilateral neural foraminal stenosis. L4-L5: Severe bilateral facet hypertrophy with grade 1 anterolisthesis and a least moderate bilateral foraminal stenosis. L5-S1: Severe facet hypertrophy and small disc bulge. Moderate right and severe left neural foraminal stenosis. Visualized sacrum: Normal. IMPRESSION: 1. Acute wedge compression fracture of L2 involving the superior endplate with no retropulsion. 2. Severe bilateral facet arthrosis at L3-S1 resulting in multilevel moderate to severe foraminal stenosis. 3.  Aortic Atherosclerosis (ICD10-I70.0). 4. Bilateral nonobstructive nephrolithiasis. Electronically Signed   By: Ulyses Jarred M.D.   On: 02/23/2017 02:12   Ct Ankle Left Wo Contrast  Result Date: 02/23/2017 CLINICAL DATA:  Ankle fracture after falling from a ladder. Status post external fixation. EXAM: CT OF THE LEFT ANKLE WITHOUT CONTRAST TECHNIQUE: Multidetector CT imaging of the left ankle was performed according to the standard protocol. Multiplanar CT image  reconstructions were also generated. COMPARISON:  Ankle x-rays dated February 22, 2017. FINDINGS: Bones/Joint/Cartilage There is a highly comminuted fracture of the distal tibia, involving the tibial plafond, metadiaphysis, and medial malleolus. There is up to 10 mm of articular surface depression along the central medial aspect of the tibial plafond. There is a 13 mm tibial plafond fragment that is flipped superiorly. The  medial malleolus fracture is distracted approximately 7 mm. There is a mildly displaced avulsion fracture of the lateral malleolus. There is a comminuted, displaced fracture of the posterior lateral talar dome. There is a comminuted fracture of the lateral talar process. There is widening of the tibiotalar joint with multiple bony fragments in the joint space. Two external fixation screws are noted through the calcaneus. Mild degenerative changes of the midfoot. Os peroneum. Small plantar and Achilles enthesophytes. Ligaments Suboptimally assessed by CT. Muscles and Tendons The posterior tibialis and flexor digitorum longus tendons are surrounded by bony fragments (series 4, image 25). Portions of the peroneal tendons herniate into the lateral malleolus avulsion fracture. Dystrophic calcifications within the distal Achilles tendon, consistent with prior injury/tendinosis. Soft tissues Diffuse soft tissue swelling about the ankle. IMPRESSION: 1. Highly comminuted fracture of the distal tibia involving the metadiaphysis, medial malleolus, and tibial plafond, with up to 10 mm of articular surface depression, as described above. 2. Avulsion fracture of the lateral malleolus. 3. Comminuted fractures of the posterolateral talar dome and lateral talar process. 4. Potential entrapment of the posterior tibialis, flexor digitorum longus, and peroneal tendons. Electronically Signed   By: Titus Dubin M.D.   On: 02/23/2017 08:40   Dg Ankle Left Port  Result Date: 02/22/2017 CLINICAL DATA:  Pain  following fall EXAM: PORTABLE LEFT ANKLE - 2 VIEW COMPARISON:  None. FINDINGS: Frontal and lateral views were obtained it. There is a comminuted fracture of the distal tibia with displaced fracture fragments distally. Fracture extends to the tibial plafond with gross ankle mortise disruption. There is avulsion of the medial malleolus. Fibula does not appear fractured. There is associated soft tissue swelling. There are small calcaneal spurs. IMPRESSION: Comminuted fracture of the distal tibia extending from the diaphysis seal region through the metaphysis and into the plafond. There is avulsion of the medial malleolus from the remainder of the tibia. There is gross ankle mortise disruption. Fibula appears intact. Electronically Signed   By: Lowella Grip III M.D.   On: 02/22/2017 18:08   Dg Foot 2 Views Left  Result Date: 02/22/2017 CLINICAL DATA:  Fall from ladder, left ankle comminuted fracture dislocation EXAM: LEFT FOOT - 2 VIEW COMPARISON:  02/22/2017 FINDINGS: Acute comminuted left ankle fracture dislocation noted. Soft tissue swelling. Bones are osteopenic. Chronic fusion of the left first MTP joint. No other acute fracture involving the foot. IMPRESSION: Acute comminuted left ankle fracture dislocation. Chronic left first MTP joint ankylosis. Electronically Signed   By: Jerilynn Mages.  Shick M.D.   On: 02/22/2017 18:09   Dg C-arm 1-60 Min  Result Date: 02/22/2017 CLINICAL DATA:  External Fixation Left Ankle. Fluoro time: 42 sec EXAM: LEFT ANKLE - 2 VIEW; DG C-ARM 61-120 MIN COMPARISON:  Plain film of the left ankle from earlier same day. FINDINGS: Seven intraoperative fluoroscopic images are provided. Final images show significantly improved osseous alignment at the left ankle with symmetric ankle mortise. Fluoroscopy was provided for 42 seconds. IMPRESSION: Intraoperative fluoroscopic images showing significant interval improvement of the osseous alignment at the left ankle. Electronically Signed   By: Franki Cabot M.D.   On: 02/22/2017 22:44   Superior endplate L2 compression fracture with no retropulsion  Assessment/Plan: 76 year male presents to the ED after falling off of his ladder at home while cleaning the gutters. His biggest complaint is his left ankle but states that he does have some back pain. CT was completed of his lumbar spine: superior endplate compression fracture at L2. Will  place him in a lumbar brace. No surgical intervention needed at this time.    Ocie Cornfield Tamaya Pun 02/23/2017 10:29 AM

## 2017-02-23 NOTE — Progress Notes (Signed)
Patient places himself on CPAP and will call if any assistance needed. 

## 2017-02-23 NOTE — Progress Notes (Signed)
PROGRESS NOTE  Troy Banks  JKD:[id number] DOB: 07-05-1940 DOA: 02/22/2017 PCP: [name], MD  Brief Narrative:   Troy Banks is a 76 y.o. male with medical history significant of CAD s/p CABG, AS s/p AVR, diastolic CHF with preserved EF, HTN.  Patient presented to the ED after falling off of a roof on the day of admission.  Had been feeling fine just before the fall.  Landed on L ankle.  Severe L ankle pain and deformity after fall.  Underwent external fixation on 9/19 by Dr. Percell Miller.  He also reported some back pain and was found to have an acute L2 compression fracture.  He was put in a back brace.  Patient worked with PT who recommended home health PT.    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  Also had AVR -  EF 55-60% -  Stress test 4 month ago low risk  Chronic diastolic heart failure -  Resume lasix  Hypertension, blood pressure mildly elevated -  Continue norvasc  Hypothyroidism, stable, continue synthroid  OSA, stable, continue cpap  Diabetes mellitus type 2, A1c 5.9 10/26/2016 -  CBG well controlled without insulin  DVT prophylaxis:  lovenox Code Status:  Full code Family Communication:  Patient alone Disposition Plan:  Possibly home with home health with outpatient follow up next week  for ORIF.  Reevaluate tomorrow.  OT consult pending   Consultants:   Neurosurgery, Dr. Hurman Horn Orthopedic surgery  Procedures:  External fixation of left ankle fracture  Antimicrobials:  Anti-infectives    Start     Dose/Rate Route Frequency Ordered Stop   02/22/17 2100  ceFAZolin (ANCEF) IVPB 2g/100 mL premix     2 g 200 mL/hr over 30 Minutes Intravenous  Once 02/22/17 2029 02/22/17 2135   02/22/17 2031  ceFAZolin (ANCEF) 2-4 GM/100ML-% IVPB    Comments:  Grace Blight   : cabinet override      02/22/17 2031 02/22/17 2105       Subjective:  Feeling well today.  Denies chest pains, shortness of breath.  Having some pain in the left ankle and in his back, but better with pain medication.    Objective: Vitals:   02/23/17 0510 02/23/17 0928 02/23/17 1255 02/23/17 1457  BP: (!) 158/71 (!) 147/63 139/65 133/60  Pulse: 68 69 68 71  Resp: 16 18 17 18   Temp: 97.8 F (36.6 C) 98.5 F (36.9 C) 99.6 F (37.6 C) 99.1 F (37.3 C)  TempSrc: Oral Oral Oral Oral  SpO2: 96% 99% 98% 95%  Weight:      Height:        Intake/Output Summary (Last 24 hours) at 02/23/17 1540 Last data filed at 02/23/17 1500  Gross per 24 hour  Intake  1700 ml  Output             2895 ml  Net            -1195 ml   Filed Weights   02/22/17 1627  Weight: 92.1 kg (203 lb)    Examination:  General exam:  Adult male.  No acute distress.  HEENT:  NCAT, MMM Respiratory system: Clear to auscultation bilaterally Cardiovascular system: Regular rate and rhythm, 3/6 systolic murmur  Warm extremities Gastrointestinal system: Normal active bowel sounds, soft, nondistended, nontender. MSK:  Left foot bandaged with ACE and in external fixation.  Toes are swollen.  < 2 sec CR, sensation intact to light touch, able to wiggle left toes.   Neuro:  Grossly intact    Data Reviewed: I have personally reviewed following labs and imaging studies  CBC:  Recent Labs Lab 02/22/17 1836    WBC 14.1*  NEUTROABS 11.4*  HGB 14.6  HCT 43.1  MCV 88.9  PLT 485*   Basic Metabolic Panel:  Recent Labs Lab 02/22/17 1836  NA 141  K 3.6  CL 108  CO2 24  GLUCOSE 117*  BUN 14  CREATININE 0.91  CALCIUM 9.0   GFR: Estimated Creatinine Clearance: 78.5 mL/min (by C-G formula based on SCr of 0.91 mg/dL). Liver Function Tests: No results for input(s): AST, ALT, ALKPHOS, BILITOT, PROT, ALBUMIN in the last 168 hours. No results for input(s): LIPASE, AMYLASE in the last 168 hours. No results for input(s): AMMONIA in the last 168 hours. Coagulation Profile: No results for input(s): INR, PROTIME in the last 168 hours. Cardiac Enzymes: No results for input(s): CKTOTAL, CKMB, CKMBINDEX, TROPONINI in the last 168 hours. BNP (last 3 results) No results for input(s): PROBNP in the last 8760 hours. HbA1C: No results for input(s): HGBA1C in the last 72 hours. CBG:  Recent Labs Lab 02/22/17 2218 02/23/17 0730 02/23/17 1251  GLUCAP 135* 133* 115*   Lipid Profile: No results for input(s): CHOL, HDL, LDLCALC, TRIG, CHOLHDL, LDLDIRECT in the last 72 hours. Thyroid Function Tests: No results for input(s): TSH, T4TOTAL, FREET4, T3FREE, THYROIDAB in the last 72 hours. Anemia Panel: No results for input(s): VITAMINB12, FOLATE, FERRITIN, TIBC, IRON, RETICCTPCT in the last 72 hours. Urine analysis:    Component Value Date/Time   COLORURINE YELLOW 10/26/2016 0935   APPEARANCEUR CLEAR 10/26/2016 0935   LABSPEC <=1.005 (A) 10/26/2016 0935   PHURINE 6.0 10/26/2016 0935   GLUCOSEU NEGATIVE 10/26/2016 0935   HGBUR LARGE (A) 10/26/2016 0935   BILIRUBINUR NEGATIVE 10/26/2016 0935   KETONESUR NEGATIVE 10/26/2016 0935   PROTEINUR NEGATIVE 03/28/2015 1918   UROBILINOGEN 0.2 10/26/2016 0935   NITRITE NEGATIVE 10/26/2016 0935   LEUKOCYTESUR NEGATIVE 10/26/2016 0935   Sepsis Labs: @LABRCNTIP (procalcitonin:4,lacticidven:4)  ) Recent Results (from the past 240 hour(s))  Surgical pcr  screen     Status: Abnormal   Collection Time: 02/22/17  8:49 PM  Result Value Ref Range Status   MRSA, PCR NEGATIVE NEGATIVE Final   Staphylococcus aureus POSITIVE (A) NEGATIVE Final    Comment: (NOTE) The Xpert SA Assay (FDA approved for NASAL specimens in patients 77 years of age and older), is one component of a comprehensive surveillance program. It is not intended to diagnose infection nor to guide or monitor treatment.       Radiology Studies: Dg Chest 1 View  Result Date: 02/22/2017 CLINICAL DATA:  Fall off roof, preop EXAM: CHEST 1 VIEW COMPARISON:  10/27/2016 FINDINGS: Lungs are clear.  No pleural  effusion or pneumothorax. The heart is normal in size. Prosthetic valve. Postsurgical changes related to prior CABG. Median sternotomy. IMPRESSION: No evidence of acute cardiopulmonary disease. Electronically Signed   By: Julian Hy M.D.   On: 02/22/2017 20:18   Dg Lumbar Spine Complete  Result Date: 02/22/2017 CLINICAL DATA:  Fall off roof EXAM: LUMBAR SPINE - COMPLETE 4+ VIEW COMPARISON:  CT abdomen/ pelvis dated 10/28/2016 FINDINGS: Mild superior endplate compression fracture deformity at L2, with approximately 20% loss of height. No retropulsion or involvement of the posterior elements. Mild degenerative changes. Visualized bony pelvis appears intact. Vascular calcifications. IMPRESSION: Mild superior endplate compression fracture deformity at L2 with approximately 20% loss of height. No retropulsion. Electronically Signed   By: Julian Hy M.D.   On: 02/22/2017 20:18   Dg Pelvis 1-2 Views  Result Date: 02/22/2017 CLINICAL DATA:  [place] off a 15 foot roof today.  Low back pain. EXAM: PELVIS - 1-2 VIEW COMPARISON:  10/18/2016 FINDINGS: Again noted is a right hip replacement which appears to be grossly intact. The pelvic bony ring is intact. Evidence for facet arthropathy in the lower lumbar spine. Right femoral stem is not completely visualized. No gross abnormality to the  left hip. IMPRESSION: No acute abnormality to the pelvis. Electronically Signed   By: Markus Daft M.D.   On: 02/22/2017 20:17   Dg Ankle 2 Views Left  Result Date: 02/22/2017 CLINICAL DATA:  External Fixation Left Ankle. Fluoro time: 42 sec EXAM: LEFT ANKLE - 2 VIEW; DG C-ARM 61-120 MIN COMPARISON:  Plain film of the left ankle from earlier same day. FINDINGS: Seven intraoperative fluoroscopic images are provided. Final images show significantly improved osseous alignment at the left ankle with symmetric ankle mortise. Fluoroscopy was provided for 42 seconds. IMPRESSION: Intraoperative fluoroscopic images showing significant interval improvement of the osseous alignment at the left ankle. Electronically Signed   By: Franki Cabot M.D.   On: 02/22/2017 22:44   Ct Lumbar Spine Wo Contrast  Result Date: 02/23/2017 CLINICAL DATA:  Fall from ladder EXAM: CT LUMBAR SPINE WITHOUT CONTRAST TECHNIQUE: Multidetector CT imaging of the lumbar spine was performed without intravenous contrast administration. Multiplanar CT image reconstructions were also generated. COMPARISON:  Lumbar spine radiograph 02/22/2017 FINDINGS: Segmentation: 5 lumbar type vertebrae. Alignment: Grade 1 L4-L5 anterolisthesis secondary to advanced facet hypertrophy. Vertebrae: There is a wedge compression fracture of L2 involving the superior endplate with approximately 25% anterior height loss. No retropulsion. No other vertebral fracture. Paraspinal and other soft tissues: There is atherosclerotic calcification of the aorta and iliac arteries. Calcification in the right adrenal gland. Nonobstructing bilateral renal calculi. Disc levels: T12-L1: Normal disc space and facets. No spinal canal or neuroforaminal stenosis. L1-L2: Small central disc protrusion.  No stenosis. L2-L3: Small disc bulge without stenosis. L3-L4: Severe bilateral facet hypertrophy with mild bilateral neural foraminal stenosis. L4-L5: Severe bilateral facet hypertrophy with  grade 1 anterolisthesis and a least moderate bilateral foraminal stenosis. L5-S1: Severe facet hypertrophy and small disc bulge. Moderate right and severe left neural foraminal stenosis. Visualized sacrum: Normal. IMPRESSION: 1. Acute wedge compression fracture of L2 involving the superior endplate with no retropulsion. 2. Severe bilateral facet arthrosis at L3-S1 resulting in multilevel moderate to severe foraminal stenosis. 3.  Aortic Atherosclerosis (ICD10-I70.0). 4. Bilateral nonobstructive nephrolithiasis. Electronically Signed   By: Ulyses Jarred M.D.   On: 02/23/2017 02:12   Ct Ankle Left Wo Contrast  Result Date: 02/23/2017 CLINICAL DATA:  Ankle fracture after falling from a ladder. Status post  external fixation. EXAM: CT OF THE LEFT ANKLE WITHOUT CONTRAST TECHNIQUE: Multidetector CT imaging of the left ankle was performed according to the standard protocol. Multiplanar CT image reconstructions were also generated. COMPARISON:  Ankle x-rays dated February 22, 2017. FINDINGS: Bones/Joint/Cartilage There is a highly comminuted fracture of the distal tibia, involving the tibial plafond, metadiaphysis, and medial malleolus. There is up to 10 mm of articular surface depression along the central medial aspect of the tibial plafond. There is a 13 mm tibial plafond fragment that is flipped superiorly. The medial malleolus fracture is distracted approximately 7 mm. There is a mildly displaced avulsion fracture of the lateral malleolus. There is a comminuted, displaced fracture of the posterior lateral talar dome. There is a comminuted fracture of the lateral talar process. There is widening of the tibiotalar joint with multiple bony fragments in the joint space. Two external fixation screws are noted through the calcaneus. Mild degenerative changes of the midfoot. Os peroneum. Small plantar and Achilles enthesophytes. Ligaments Suboptimally assessed by CT. Muscles and Tendons The posterior tibialis and flexor  digitorum longus tendons are surrounded by bony fragments (series 4, image 25). Portions of the peroneal tendons herniate into the lateral malleolus avulsion fracture. Dystrophic calcifications within the distal Achilles tendon, consistent with prior injury/tendinosis. Soft tissues Diffuse soft tissue swelling about the ankle. IMPRESSION: 1. Highly comminuted fracture of the distal tibia involving the metadiaphysis, medial malleolus, and tibial plafond, with up to 10 mm of articular surface depression, as described above. 2. Avulsion fracture of the lateral malleolus. 3. Comminuted fractures of the posterolateral talar dome and lateral talar process. 4. Potential entrapment of the posterior tibialis, flexor digitorum longus, and peroneal tendons. Electronically Signed   By: Titus Dubin M.D.   On: 02/23/2017 08:40   Dg Ankle Left Port  Result Date: 02/22/2017 CLINICAL DATA:  Pain following fall EXAM: PORTABLE LEFT ANKLE - 2 VIEW COMPARISON:  None. FINDINGS: Frontal and lateral views were obtained it. There is a comminuted fracture of the distal tibia with displaced fracture fragments distally. Fracture extends to the tibial plafond with gross ankle mortise disruption. There is avulsion of the medial malleolus. Fibula does not appear fractured. There is associated soft tissue swelling. There are small calcaneal spurs. IMPRESSION: Comminuted fracture of the distal tibia extending from the diaphysis seal region through the metaphysis and into the plafond. There is avulsion of the medial malleolus from the remainder of the tibia. There is gross ankle mortise disruption. Fibula appears intact. Electronically Signed   By: Lowella Grip III M.D.   On: 02/22/2017 18:08   Dg Foot 2 Views Left  Result Date: 02/22/2017 CLINICAL DATA:  Fall from ladder, left ankle comminuted fracture dislocation EXAM: LEFT FOOT - 2 VIEW COMPARISON:  02/22/2017 FINDINGS: Acute comminuted left ankle fracture dislocation noted. Soft  tissue swelling. Bones are osteopenic. Chronic fusion of the left first MTP joint. No other acute fracture involving the foot. IMPRESSION: Acute comminuted left ankle fracture dislocation. Chronic left first MTP joint ankylosis. Electronically Signed   By: Jerilynn Mages.  Shick M.D.   On: 02/22/2017 18:09   Dg C-arm 1-60 Min  Result Date: 02/22/2017 CLINICAL DATA:  External Fixation Left Ankle. Fluoro time: 42 sec EXAM: LEFT ANKLE - 2 VIEW; DG C-ARM 61-120 MIN COMPARISON:  Plain film of the left ankle from earlier same day. FINDINGS: Seven intraoperative fluoroscopic images are provided. Final images show significantly improved osseous alignment at the left ankle with symmetric ankle mortise. Fluoroscopy was provided for 42  seconds. IMPRESSION: Intraoperative fluoroscopic images showing significant interval improvement of the osseous alignment at the left ankle. Electronically Signed   By: Franki Cabot M.D.   On: 02/22/2017 22:44     Scheduled Meds: . allopurinol  300 mg Oral Daily  . amLODipine  10 mg Oral Daily  . aspirin EC  81 mg Oral Daily  . docusate sodium  100 mg Oral BID  . enoxaparin (LOVENOX) injection  40 mg Subcutaneous Daily  . famotidine  20 mg Oral BID  . furosemide  40 mg Oral QODAY  . levothyroxine  25 mcg Oral QAC breakfast  . primidone  100 mg Oral QHS  . primidone  150 mg Oral Daily  . rosuvastatin  5 mg Oral Daily   Continuous Infusions:   LOS: 1 day    Time spent: 30 min    Janece Canterbury, MD [facility] Pager [phone]  If 7PM-7AM, please contact night-coverage [email or web address] Password [email or web address] 02/23/2017, 3:40 PM

## 2017-02-23 NOTE — Progress Notes (Signed)
   Assessment / Plan: 1 Day Post-Op  S/P Procedure(s) (LRB): EXTERNAL FIXATION LEFT ANKLE (Left) by Dr. Ernesta Amble. 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  VTE prophylaxis: Lovenox, SCDs, ambulation Dispo: Likely will remain inpatient while swelling improves and mobilizes with therapy.  Planning for ORIF next Tuesday 02/28/17.    Subjective: Patient reports pain as moderate. Pain controlled with IV and PO meds.   Urinating. No CP, SOB.    Objective:   VITALS:   Vitals:   02/22/17 2240 02/22/17 2255 02/22/17 2322 02/23/17 0510  BP: (!) 146/74 (!) 151/71 (!) 153/94 (!) 158/71  Pulse: 69 66 70 68  Resp: 19 15 18 16   Temp:  97.7 F (36.5 C) 98 F (36.7 C) 97.8 F (36.6 C)  TempSrc:   Oral Oral  SpO2: 95% 96% 97% 96%  Weight:      Height:       CBC Latest Ref Rng & Units 02/22/2017 11/16/2016 10/26/2016  WBC 4.0 - 10.5 K/uL 14.1(H) 6.4 5.8  Hemoglobin 13.0 - 17.0 g/dL 14.6 14.5 14.1  Hematocrit 39.0 - 52.0 % 43.1 42.2 42.6  Platelets 150 - 400 K/uL 149(L) 143(L) 129.0(L)   BMP Latest Ref Rng & Units 02/22/2017 11/16/2016 10/26/2016  Glucose 65 - 99 mg/dL 117(H) 116(H) 116(H)  BUN 6 - 20 mg/dL 14 20 18   Creatinine 0.61 - 1.24 mg/dL 0.91 0.99 1.11  Sodium 135 - 145 mmol/L 141 143 146(H)    Potassium 3.5 - 5.1 mmol/L 3.6 4.1 4.7  Chloride 101 - 111 mmol/L 108 104 107  CO2 22 - 32 mmol/L 24 29 28   Calcium 8.9 - 10.3 mg/dL 9.0 9.4 9.9   Intake/Output      09/19 0701 - 09/20 0700   I.V. (mL/kg) 1100 (11.9)   Total Intake(mL/kg) 1100 (11.9)   Urine (mL/kg/hr) 1575   Blood 20   Total Output 1595   Net -495         Physical Exam: General: NAD.  Upright in bed.  Calm, pleasant, conversant.  No increased wob MSK  LLE Ex-Fix in place.   Sensation intact distally - improved from pre-op exam. Foot warm EHL / FHL intact Incision: dressing C/D/I Compartments soft   Prudencio Burly III, [name] 02/23/2017, 6:51 AM

## 2017-02-23 NOTE — Evaluation (Signed)
Physical Therapy Evaluation Patient Details Name: Troy Banks MRN: [id number] DOB: August 24, 1940 Today's Date: 02/23/2017   History of Present Illness  Troy Banks is a 76 y.o. male with medical history significant of CAD s/p CABG, AS s/p AVR, diastolic CHF with preserved EF, HTN.  Patient admitted due to fall from roof with L ankle fx/disclocation now s/p external fixation and L2 compression fx.   Clinical Impression  Patient presents with decreased independence with mobility due to deficits listed in PT problem list.  He currently needs min A for OOB to chair pivot transfer able to maintain NWB L LE.  He will continue to benefit from skilled PT in the acute setting to allow return home with family support and follow up HHPT.      Follow Up Recommendations Home health PT;Supervision/Assistance - 24 hour    Equipment Recommendations  None recommended by PT    Recommendations for Other Services       Precautions / Restrictions Precautions Precautions: Fall;Back Required Braces or Orthoses: Spinal Brace Spinal Brace: Lumbar corset;Applied in sitting position Restrictions Weight Bearing Restrictions: Yes LLE Weight Bearing: Non weight bearing      Mobility  Bed Mobility Overal bed mobility: Needs Assistance Bed Mobility: Supine to Sit;Rolling;Sidelying to Sit Rolling: Min assist Sidelying to sit: Min assist       General bed mobility comments: cues for technique with rail and assisted L LE off bed prior to rolling due to ex fix  Transfers Overall transfer level: Needs assistance   Transfers: Squat Pivot Transfers     Squat pivot transfers: Min assist     General transfer comment: demonstrated technique, then pt performed with assist for safety, cues for hand placement and to keep L LE NWB  Ambulation/Gait                Stairs            Wheelchair Mobility    Modified Rankin (Stroke Patients Only)       Balance Overall balance  assessment: Needs assistance Sitting-balance support: No upper extremity supported Sitting balance-Leahy Scale: Good       Standing balance-Leahy Scale: Poor Standing balance comment: due to NWB L LE                             Pertinent Vitals/Pain Pain Assessment: 0-10 Pain Score: 5  Pain Location: L ankle Pain Descriptors / Indicators: Throbbing Pain Intervention(s): Monitored during session;Repositioned    Home Living Family/patient expects to be discharged to:: Private residence Living Arrangements: Spouse/significant other Available Help at Discharge: Family Type of Home: House Home Access: Stairs to enter Entrance Stairs-Rails: Psychiatric nurse of Steps: 4 Home Layout: One level Home Equipment: Environmental consultant - 2 wheels;Cane - single point      Prior Function Level of Independence: Independent               Hand Dominance        Extremity/Trunk Assessment   Upper Extremity Assessment Upper Extremity Assessment: Overall WFL for tasks assessed    Lower Extremity Assessment Lower Extremity Assessment: RLE deficits/detail;LLE deficits/detail RLE Deficits / Details: WFL LLE Deficits / Details: ankle with ex fix, able to wiggle toes, feels light touch but is diminished from R, has ability to lift leg unaided and can bend knee LLE Sensation: decreased light touch       Communication   Communication: No difficulties  Cognition  Arousal/Alertness: Awake/alert Behavior During Therapy: WFL for tasks assessed/performed Overall Cognitive Status: History of cognitive impairments - at baseline                                 General Comments: reports memory and attention deficits at baseline      General Comments General comments (skin integrity, edema, etc.): Patient in detail described his cognitive decline with memory and attention, 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     Exercises     Assessment/Plan     PT Assessment Patient needs continued PT services  PT Problem List Decreased mobility;Decreased knowledge of precautions;Decreased activity tolerance;Pain;Impaired sensation;Decreased knowledge of use of DME;Decreased balance       PT Treatment Interventions DME instruction;Therapeutic activities;Gait training;Therapeutic exercise;Balance training;Stair training;Functional mobility training    PT Goals (Current goals can be found in the Care Plan section)  Acute Rehab PT Goals Patient Stated Goal: To get back to working out at [employer] PT Goal Formulation: With patient/family Time For Goal Achievement: 03/02/17 Potential to Achieve Goals: Good    Frequency Min 5X/week   Barriers to discharge        Co-evaluation               AM-PAC PT "6 Clicks" Daily Activity  Outcome Measure Difficulty turning over in bed (including adjusting bedclothes, sheets and blankets)?: Unable Difficulty moving from lying on back to sitting on the side of the bed? : Unable Difficulty sitting down on and standing up from a chair with arms (e.g., wheelchair, bedside commode, etc,.)?: Unable Help needed moving to and from a bed to chair (including a wheelchair)?: A Little Help needed walking in hospital room?: A Little Help needed climbing 3-5 steps with a railing? : A Lot 6 Click Score: 11    End of Session Equipment Utilized During Treatment: Gait belt Activity Tolerance: Patient tolerated treatment well Patient left: in chair;with call bell/phone within reach;with family/visitor present Nurse Communication: Mobility status PT Visit Diagnosis: Difficulty in walking, not elsewhere classified (R26.2);Pain Pain - Right/Left: Left Pain - part of body: Ankle and joints of foot    Time: 7412-8786 PT Time Calculation (min) (ACUTE ONLY): 30 min   Charges:   PT Evaluation $PT Eval Moderate Complexity: 1 Mod PT Treatments $Therapeutic Activity: 8-22 mins   PT G CodesMagda Kiel, [place] [phone] 02/23/2017   Reginia Naas 02/23/2017, 2:25 PM

## 2017-02-23 NOTE — Progress Notes (Signed)
Orthopedic Tech Progress Note Patient Details:  NYSIR FERGUSSON 11/19/1940 [id number]  Patient ID: [name], male   DOB: 1940/07/31, 76 y.o.   MRN: [id number]   Hildred Priest 02/23/2017, 9:56 AM Called in bio-tech brace order; spoke with Bella Kennedy

## 2017-02-24 ENCOUNTER — Telehealth: Payer: Self-pay | Admitting: Cardiology

## 2017-02-24 ENCOUNTER — Encounter (HOSPITAL_COMMUNITY): Payer: Self-pay | Admitting: Orthopedic Surgery

## 2017-02-24 ENCOUNTER — Telehealth: Payer: Self-pay | Admitting: Neurology

## 2017-02-24 DIAGNOSIS — E8889 Other specified metabolic disorders: Secondary | ICD-10-CM

## 2017-02-24 LAB — GLUCOSE, CAPILLARY
Glucose-Capillary: 129 mg/dL — ABNORMAL HIGH (ref 65–99)
Glucose-Capillary: 105 mg/dL — ABNORMAL HIGH (ref 65–99)
Glucose-Capillary: 126 mg/dL — ABNORMAL HIGH (ref 65–99)
Glucose-Capillary: 120 mg/dL — ABNORMAL HIGH (ref 65–99)

## 2017-02-24 MED ORDER — KETOROLAC TROMETHAMINE 15 MG/ML IJ SOLN
7.5000 mg | Freq: Four times a day (QID) | INTRAMUSCULAR | Status: AC | PRN
  Administered 2017-02-24 – 2017-02-26 (×3): 7.5 mg via INTRAVENOUS
  Filled 2017-02-24 (×3): qty 1

## 2017-02-24 MED ORDER — MUPIROCIN 2 % EX OINT
1.0000 "application " | TOPICAL_OINTMENT | Freq: Two times a day (BID) | CUTANEOUS | Status: DC
  Administered 2017-02-24 – 2017-02-27 (×6): 1 via NASAL
  Filled 2017-02-24: qty 22

## 2017-02-24 MED ORDER — HYDROCODONE-ACETAMINOPHEN 5-325 MG PO TABS
1.0000 | ORAL_TABLET | ORAL | Status: DC | PRN
  Administered 2017-02-24: 2 via ORAL
  Filled 2017-02-24: qty 2

## 2017-02-24 MED ORDER — POLYETHYLENE GLYCOL 3350 17 G PO PACK
17.0000 g | PACK | Freq: Every day | ORAL | Status: DC
  Administered 2017-02-25 – 2017-02-27 (×3): 17 g via ORAL
  Filled 2017-02-24 (×3): qty 1

## 2017-02-24 MED ORDER — SENNA 8.6 MG PO TABS
2.0000 | ORAL_TABLET | Freq: Every day | ORAL | Status: DC
  Administered 2017-02-24 – 2017-02-26 (×3): 17.2 mg via ORAL
  Filled 2017-02-24 (×3): qty 2

## 2017-02-24 MED ORDER — HYDROMORPHONE HCL 2 MG PO TABS
1.0000 mg | ORAL_TABLET | ORAL | Status: DC | PRN
  Administered 2017-02-24 – 2017-02-27 (×11): 1 mg via ORAL
  Filled 2017-02-24 (×11): qty 1

## 2017-02-24 MED ORDER — TIZANIDINE HCL 4 MG PO TABS
4.0000 mg | ORAL_TABLET | Freq: Once | ORAL | Status: AC
  Administered 2017-02-24: 4 mg via ORAL
  Filled 2017-02-24: qty 1

## 2017-02-24 MED ORDER — CHLORHEXIDINE GLUCONATE CLOTH 2 % EX PADS
6.0000 | MEDICATED_PAD | Freq: Every day | CUTANEOUS | Status: DC
  Administered 2017-02-25 – 2017-02-27 (×3): 6 via TOPICAL

## 2017-02-24 NOTE — Evaluation (Signed)
Occupational Therapy Evaluation Patient Details Name: Troy Banks MRN: [id number] DOB: 09/25/1940 Today's Date: 02/24/2017    History of Present Illness Troy Banks is a 76 y.o. male with medical history significant of CAD s/p CABG, AS s/p AVR, diastolic CHF with preserved EF, HTN.  Patient admitted due to fall from roof with L ankle fx/disclocation now s/p external fixation and L2 compression fx.    Clinical Impression   Pt admitted with the above diagnoses and presents with below problem list. Pt will benefit from continued acute OT to address the below listed deficits and maximize independence with basic ADLs prior to d/c to venue below. PTA pt was independent with ADLs. Pt is currently min-mod A with LB ADLs (sitting/lateral lean), min A for toilet transfer (squat pivot) simulated with EOB>recliner.         Follow Up Recommendations  Home health OT    Equipment Recommendations  3 in 1 bedside commode    Recommendations for Other Services       Precautions / Restrictions Precautions Precautions: Fall;Back Required Braces or Orthoses: Spinal Brace Spinal Brace: Lumbar corset;Applied in sitting position Restrictions Weight Bearing Restrictions: Yes LLE Weight Bearing: Non weight bearing      Mobility Bed Mobility Overal bed mobility: Needs Assistance Bed Mobility: Supine to Sit     Supine to sit: Min guard     General bed mobility comments: cues for technique with rail and assisted L LE off bed prior to rolling due to ex fix  Transfers Overall transfer level: Needs assistance   Transfers: Squat Pivot Transfers     Squat pivot transfers: Min assist     General transfer comment: demonstrated technique, then pt performed with assist for safety, cues for hand placement and to keep L LE NWB    Balance Overall balance assessment: Needs assistance Sitting-balance support: No upper extremity supported Sitting balance-Leahy Scale: Good                                      ADL either performed or assessed with clinical judgement   ADL Overall ADL's : Needs assistance/impaired Eating/Feeding: Set up;Sitting   Grooming: Set up;Sitting   Upper Body Bathing: Set up;Sitting   Lower Body Bathing: Sitting/lateral leans;Moderate assistance   Upper Body Dressing : Set up;Sitting   Lower Body Dressing: Moderate assistance;Sitting/lateral leans   Toilet Transfer: Minimal assistance;Squat-pivot;BSC   Toileting- Clothing Manipulation and Hygiene: Minimal assistance;Sit to/from stand   Tub/ Shower Transfer: Minimal assistance;Squat-pivot;3 in 1;Rolling walker   Functional mobility during ADLs: Minimal assistance;Rolling walker General ADL Comments: Pt completed bed mobility and squat pivot to recliner. Spouse present. ADL education present.      Vision         Perception     Praxis      Pertinent Vitals/Pain Pain Assessment: 0-10 Pain Score: 6  Pain Location: L ankle Pain Descriptors / Indicators: Throbbing Pain Intervention(s): Limited activity within patient's tolerance;Monitored during session;Repositioned;Ice applied     Hand Dominance Left   Extremity/Trunk Assessment Upper Extremity Assessment Upper Extremity Assessment: Overall WFL for tasks assessed   Lower Extremity Assessment Lower Extremity Assessment: Defer to PT evaluation       Communication Communication Communication: No difficulties   Cognition Arousal/Alertness: Awake/alert Behavior During Therapy: WFL for tasks assessed/performed Overall Cognitive Status: History of cognitive impairments - at baseline  General Comments: reports memory and attention deficits at baseline   General Comments       Exercises     Shoulder Instructions      Home Living Family/patient expects to be discharged to:: Private residence Living Arrangements: Spouse/significant other Available Help at  Discharge: Family Type of Home: House Home Access: Stairs to enter Technical brewer of Steps: 4 Entrance Stairs-Rails: [place]: One level     Bathroom Shower/Tub: [place]: Environmental consultant - 2 wheels;Cane - single point          Prior Functioning/Environment Level of Independence: Independent                 OT Problem List: Impaired balance (sitting and/or standing);Decreased knowledge of use of DME or AE;Decreased knowledge of precautions;Pain      OT Treatment/Interventions: Self-care/ADL training;DME and/or AE instruction;Therapeutic activities;Patient/family education;Balance training    OT Goals(Current goals can be found in the care plan section) Acute Rehab OT Goals Patient Stated Goal: To get back to working out at [employer] Goal Formulation: With patient/family Time For Goal Achievement: 03/10/17 Potential to Achieve Goals: Good ADL Goals Pt Will Perform Lower Body Bathing: with min guard assist;sitting/lateral leans;sit to/from stand Pt Will Perform Lower Body Dressing: with min guard assist;sitting/lateral leans;sit to/from stand Pt Will Transfer to Toilet: with min guard assist;ambulating;bedside commode Pt Will Perform Toileting - Clothing Manipulation and hygiene: with min guard assist;sitting/lateral leans;sit to/from stand Pt Will Perform Tub/Shower Transfer: Shower transfer;with min guard assist;ambulating;3 in 1;rolling walker Additional ADL Goal #1: Pt will complete bed mobility at supervision level to prepare for OOB ADLs.   OT Frequency: Min 2X/week   Barriers to D/C:            Co-evaluation              AM-PAC PT "6 Clicks" Daily Activity     Outcome Measure Help from another person eating meals?: None Help from another person taking care of personal grooming?: None Help from another person toileting, which includes using toliet, bedpan, or urinal?: A Little Help from another  person bathing (including washing, rinsing, drying)?: A Lot Help from another person to put on and taking off regular upper body clothing?: A Little Help from another person to put on and taking off regular lower body clothing?: A Lot 6 Click Score: 18   End of Session    Activity Tolerance: Patient tolerated treatment well Patient left: in chair;with call bell/phone within reach;with family/visitor present  OT Visit Diagnosis: Unsteadiness on feet (R26.81);Pain Pain - Right/Left: Left Pain - part of body: Leg;Ankle and joints of foot                Time: 7591-6384 OT Time Calculation (min): 22 min Charges:  OT General Charges $OT Visit: 1 Visit OT Evaluation $OT Eval Low Complexity: 1 Low G-Codes:       Hortencia Pilar 02/24/2017, 12:54 PM

## 2017-02-24 NOTE — Telephone Encounter (Signed)
Dopplers last November showed only mild nonobstructive carotid disease. This can clearly wait. He does not need to be seen in hospital.  [name] MD, [facility]

## 2017-02-24 NOTE — Telephone Encounter (Signed)
I'm so sorry to hear that but WHY was he on the roof!!?  I hope he heals fast!

## 2017-02-24 NOTE — Progress Notes (Signed)
Physical Therapy Treatment Patient Details Name: Troy Banks MRN: [id number] DOB: Oct 29, 1940 Today's Date: 02/24/2017    History of Present Illness CHESLEY Banks is a 76 y.o. male with medical history significant of CAD s/p CABG, AS s/p AVR, diastolic CHF with preserved EF, HTN.  Patient admitted due to fall from roof with L ankle fx/disclocation now s/p external fixation and L2 compression fx.     PT Comments    Patient is making progress toward mobility goals. This session focused on bed mobility, functional transfers, and gait training. Pt was able to ambulate 68ft with RW and min A while maintaining NWB status. Continue to progress as tolerated.    Follow Up Recommendations  Home health PT;Supervision/Assistance - 24 hour     Equipment Recommendations  None recommended by PT    Recommendations for Other Services       Precautions / Restrictions Precautions Precautions: Fall;Back Required Braces or Orthoses: Spinal Brace Spinal Brace: Lumbar corset;Applied in sitting position Restrictions Weight Bearing Restrictions: Yes LLE Weight Bearing: Non weight bearing    Mobility  Bed Mobility Overal bed mobility: Needs Assistance Bed Mobility: Supine to Sit;Rolling;Sit to Sidelying Rolling: Min assist Sidelying to sit: Min assist Supine to sit: Min assist     General bed mobility comments: cues for sequencing and technique for log roll; assist to mobilize L LE   Transfers Overall transfer level: Needs assistance Equipment used: Rolling walker (2 wheeled) Transfers: Sit to/from Stand     Squat pivot transfers: Min assist     General transfer comment: assist to maintain L LE NWB and to steady during transition of hand placement to RW; cues for hand placement and technique  Ambulation/Gait Ambulation/Gait assistance: Min assist Ambulation Distance (Feet): 14 Feet Assistive device: Rolling walker (2 wheeled) Gait Pattern/deviations: Step-to pattern;Trunk  flexed Gait velocity: decreased   General Gait Details: multimodal cues for posture and vc for sequencing, technique, and proximity of RW when turning   Stairs            Wheelchair Mobility    Modified Rankin (Stroke Patients Only)       Balance Overall balance assessment: Needs assistance Sitting-balance support: No upper extremity supported Sitting balance-Leahy Scale: Good       Standing balance-Leahy Scale: Poor Standing balance comment: due to NWB L LE                            Cognition Arousal/Alertness: Awake/alert Behavior During Therapy: WFL for tasks assessed/performed Overall Cognitive Status: History of cognitive impairments - at baseline                                 General Comments: reports memory and attention deficits at baseline      Exercises      General Comments General comments (skin integrity, edema, etc.): girlfriend present throughout session      Pertinent Vitals/Pain Pain Assessment: Faces Faces Pain Scale: Hurts little more Pain Location: L LE and back Pain Descriptors / Indicators: Throbbing;Aching;Guarding;Sore Pain Intervention(s): Limited activity within patient's tolerance;Monitored during session;Repositioned;Premedicated before session;Ice applied    Home Living                      Prior Function            PT Goals (current goals can now be found in the  care plan section) Acute Rehab PT Goals Patient Stated Goal: To get back to working out at [employer] PT Goal Formulation: With patient/family Time For Goal Achievement: 03/02/17 Potential to Achieve Goals: Good Progress towards PT goals: Progressing toward goals    Frequency    Min 5X/week      PT Plan Current plan remains appropriate    Co-evaluation              AM-PAC PT "6 Clicks" Daily Activity  Outcome Measure  Difficulty turning over in bed (including adjusting bedclothes, sheets and  blankets)?: Unable Difficulty moving from lying on back to sitting on the side of the bed? : Unable Difficulty sitting down on and standing up from a chair with arms (e.g., wheelchair, bedside commode, etc,.)?: Unable Help needed moving to and from a bed to chair (including a wheelchair)?: A Little Help needed walking in hospital room?: A Little Help needed climbing 3-5 steps with a railing? : A Lot 6 Click Score: 11    End of Session Equipment Utilized During Treatment: Gait belt Activity Tolerance: Patient tolerated treatment well Patient left: with call bell/phone within reach;with family/visitor present;in bed Nurse Communication: Mobility status PT Visit Diagnosis: Difficulty in walking, not elsewhere classified (R26.2);Pain Pain - Right/Left: Left Pain - part of body: Ankle and joints of foot     Time: 1520-1550 PT Time Calculation (min) (ACUTE ONLY): 30 min  Charges:  $Gait Training: 8-22 mins $Therapeutic Activity: 8-22 mins                    G Codes:       Troy Banks, PTA Pager: [phone]     Darliss Cheney 02/24/2017, 4:04 PM

## 2017-02-24 NOTE — Telephone Encounter (Signed)
FYI

## 2017-02-24 NOTE — Telephone Encounter (Signed)
New message   Pt wife calling and wants to know if Dr. [name] could just come see him in the hospital since he is checking into the hospital now and cancel the October appt. She requests a call back.

## 2017-02-24 NOTE — Care Management Note (Signed)
Case Management Note  Patient Details  Name: Troy Banks MRN: [id number] Date of Birth: Feb 19, 1941  Subjective/Objective:    76 yr old gentleman admitted after a fall from ladder. Patient underwent left ankle fixation, will return to OR in next few days.                  Action/Plan:  Case manager spoke with patient and his wife concerning discharge plan. Choice for Home Health agency was offered. Patient's wife is a retired Therapist, sports and said that she has worked for [employer] in the past and would like to use them for her husband's therapy. Referral was called to Melene Muller, [facility] Liaison. CM will continue to monitor.    Expected Discharge Date:     pending           Expected Discharge Plan:  [place]  In-House Referral:  NA  Discharge planning Services  CM Consult  Post Acute Care Choice:  Home Health Choice offered to:  Spouse, Patient  DME Arranged:  N/A DME Agency:     HH Arranged:  PT HH Agency:  [facility]  Status of Service:  In process, will continue to follow  If discussed at Long Length of Stay Meetings, dates discussed:    Additional Comments:  Ninfa Meeker, RN 02/24/2017, 10:52 AM

## 2017-02-24 NOTE — Progress Notes (Addendum)
PROGRESS NOTE  Troy Banks  FMB:[id number] DOB: 03/21/1941 DOA: 02/22/2017 PCP: [name], MD  Brief Narrative:   Troy Banks is a 76 y.o. male with medical history significant of CAD s/p CABG, AS s/p AVR, diastolic CHF with preserved EF, HTN.  Patient presented to the ED after falling off of a roof on the day of admission.  Had been feeling fine just before the fall.  Landed on L ankle.  Severe L ankle pain and deformity after fall.  Underwent external fixation on 9/19 by Dr. Percell Miller.  He also reported some back pain and was found to have an acute L2 compression fracture.  He was put in a back brace.  PT/OT recommending home with home health services.  Patient's wife is a former [employer] scientist.    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  Dr. [name] has been asked to review case to make a recommendation regarding plan of care.   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  Also had AVR -  EF 55-60% -  Stress test 4 month ago low risk  Chronic diastolic heart failure -  Continue lasix  Hypertension, blood pressure mildly elevated -  Continue norvasc  Hypothyroidism, stable, continue synthroid  OSA, stable, continue  cpap  Diabetes mellitus type 2, A1c 5.9 10/26/2016 -  CBG well controlled without insulin  DVT prophylaxis:  lovenox Code Status:  Full code Family Communication:  Patient alone Disposition Plan:  Awaiting Dr. [name] recommendation.  Otherwise, home with PT/OT and wife who is RN pending surgery sometime next week.    Consultants:   Neurosurgery, Dr. Hurman Horn Orthopedic surgery  Procedures:  External fixation of left ankle fracture  Antimicrobials:  Anti-infectives    Start     Dose/Rate Route Frequency Ordered Stop   02/22/17 2100  ceFAZolin (ANCEF) IVPB 2g/100 mL premix     2 g 200 mL/hr over 30 Minutes Intravenous  Once 02/22/17 2029 02/22/17 2135   02/22/17 2031  ceFAZolin (ANCEF) 2-4 GM/100ML-% IVPB    Comments:  Grace Blight   : cabinet override      02/22/17 2031 02/22/17 2105       Subjective:  Continues to feel better, however, he was concerned by the conversation he had with the orthopedic physician this morning who was discussing the possibility of trying to repair the joint with plates/pins, etc. Vs. Fusing the joint.  Otherwise, denies chest pain, SOB, nausea, vomiting.  Denies constipation.    Objective: Vitals:   02/23/17 1457 02/23/17 2114 02/24/17 0559 02/24/17 1300  BP: 133/60 (!) 148/64 (!) 150/70 (!) 148/70  Pulse: 71 67 64 68  Resp:  18 16 16 16   Temp: 99.1 F (37.3 C) 98.7 F (37.1 C) 99.4 F (37.4 C) (!) 97.5 F (36.4 C)  TempSrc: Oral Oral Oral Oral  SpO2: 95% 93% 98% 95%  Weight:      Height:        Intake/Output Summary (Last 24 hours) at 02/24/17 1719 Last data filed at 02/24/17 1500  Gross per 24 hour  Intake              480 ml  Output             2350 ml  Net            -1870 ml   Filed Weights   02/22/17 1627  Weight: 92.1 kg (203 lb)    Examination:  General exam:  Adult male.  No acute distress.  HEENT:  NCAT, MMM Respiratory system: Clear to auscultation bilaterally Cardiovascular system: Regular rate and  TKPTWS,5/6 systolic murmur.  Warm extremities Gastrointestinal system: Normal active bowel sounds, soft, nondistended, nontender. MSK:  Normal tone and bulk, left lower extremity with ACE bandage, in external fixation.  Toes are less swollen.  < 2 sec CR, SITLT, able to wiggle toes Neuro:  Grossly intact   Data Reviewed: I have personally reviewed following labs and imaging studies  CBC:  Recent Labs Lab 02/22/17 1836  WBC 14.1*  NEUTROABS 11.4*  HGB 14.6  HCT 43.1  MCV 88.9  PLT 812*   Basic Metabolic Panel:  Recent Labs Lab 02/22/17 1836  NA 141  K 3.6  CL 108  CO2 24  GLUCOSE 117*  BUN 14  CREATININE 0.91  CALCIUM 9.0   GFR: Estimated Creatinine Clearance: 78.5 mL/min (by C-G formula based on SCr of 0.91 mg/dL). Liver Function Tests: No results for input(s): AST, ALT, ALKPHOS, BILITOT, PROT, ALBUMIN in the last 168 hours. No results for input(s): LIPASE, AMYLASE in the last 168 hours. No results for input(s): AMMONIA in the last 168 hours. Coagulation Profile: No results for input(s): INR, PROTIME in the last 168 hours. Cardiac Enzymes: No results for input(s): CKTOTAL, CKMB, CKMBINDEX, TROPONINI in the last 168 hours. BNP (last 3 results) No results for input(s): PROBNP in the last 8760 hours. HbA1C: No results for input(s): HGBA1C in the last 72 hours. CBG:  Recent Labs Lab 02/23/17 1251 02/23/17 1728 02/23/17 2116 02/24/17 0708 02/24/17 1157  GLUCAP 115* 141* 112* 129* 105*   Lipid Profile: No results for input(s): CHOL, HDL, LDLCALC, TRIG, CHOLHDL, LDLDIRECT in the last 72 hours. Thyroid Function Tests: No results for input(s): TSH, T4TOTAL, FREET4, T3FREE, THYROIDAB in the last 72 hours. Anemia Panel: No results for input(s): VITAMINB12, FOLATE, FERRITIN, TIBC, IRON, RETICCTPCT in the last 72 hours. Urine analysis:    Component Value Date/Time   COLORURINE YELLOW 10/26/2016 0935   APPEARANCEUR CLEAR 10/26/2016 0935   LABSPEC <=1.005 (A)  10/26/2016 0935   PHURINE 6.0 10/26/2016 0935   GLUCOSEU NEGATIVE 10/26/2016 0935   HGBUR LARGE (A) 10/26/2016 0935   BILIRUBINUR NEGATIVE 10/26/2016 0935   KETONESUR NEGATIVE 10/26/2016 0935   PROTEINUR NEGATIVE 03/28/2015 1918   UROBILINOGEN 0.2 10/26/2016 0935   NITRITE NEGATIVE 10/26/2016 0935   LEUKOCYTESUR NEGATIVE 10/26/2016 0935   Sepsis Labs: @LABRCNTIP (procalcitonin:4,lacticidven:4)  ) Recent Results (from the past 240 hour(s))  Surgical pcr screen     Status: Abnormal   Collection Time: 02/22/17  8:49 PM  Result Value Ref Range Status   MRSA, PCR NEGATIVE NEGATIVE Final   Staphylococcus  aureus POSITIVE (A) NEGATIVE Final    Comment: (NOTE) The Xpert SA Assay (FDA approved for NASAL specimens in patients 67 years of age and older), is one component of a comprehensive surveillance program. It is not intended to diagnose infection nor to guide or monitor treatment.       Radiology Studies: Dg Chest 1 View  Result Date: 02/22/2017 CLINICAL DATA:  Fall off roof, preop EXAM: CHEST 1 VIEW COMPARISON:  10/27/2016 FINDINGS: Lungs are clear.  No pleural effusion or pneumothorax. The heart is normal in size. Prosthetic valve. Postsurgical changes related to prior CABG. Median sternotomy. IMPRESSION: No evidence of acute cardiopulmonary disease. Electronically Signed   By: Julian Hy M.D.   On: 02/22/2017 20:18   Dg Lumbar Spine Complete  Result Date: 02/22/2017 CLINICAL DATA:  Fall off roof EXAM: LUMBAR SPINE - COMPLETE 4+ VIEW COMPARISON:  CT abdomen/ pelvis dated 10/28/2016 FINDINGS: Mild superior endplate compression fracture deformity at L2, with approximately 20% loss of height. No retropulsion or involvement of the posterior elements. Mild degenerative changes. Visualized bony pelvis appears intact. Vascular calcifications. IMPRESSION: Mild superior endplate compression fracture deformity at L2 with approximately 20% loss of height. No retropulsion. Electronically  Signed   By: Julian Hy M.D.   On: 02/22/2017 20:18   Dg Pelvis 1-2 Views  Result Date: 02/22/2017 CLINICAL DATA:  [place] off a 15 foot roof today.  Low back pain. EXAM: PELVIS - 1-2 VIEW COMPARISON:  10/18/2016 FINDINGS: Again noted is a right hip replacement which appears to be grossly intact. The pelvic bony ring is intact. Evidence for facet arthropathy in the lower lumbar spine. Right femoral stem is not completely visualized. No gross abnormality to the left hip. IMPRESSION: No acute abnormality to the pelvis. Electronically Signed   By: Markus Daft M.D.   On: 02/22/2017 20:17   Dg Ankle 2 Views Left  Result Date: 02/22/2017 CLINICAL DATA:  External Fixation Left Ankle. Fluoro time: 42 sec EXAM: LEFT ANKLE - 2 VIEW; DG C-ARM 61-120 MIN COMPARISON:  Plain film of the left ankle from earlier same day. FINDINGS: Seven intraoperative fluoroscopic images are provided. Final images show significantly improved osseous alignment at the left ankle with symmetric ankle mortise. Fluoroscopy was provided for 42 seconds. IMPRESSION: Intraoperative fluoroscopic images showing significant interval improvement of the osseous alignment at the left ankle. Electronically Signed   By: Franki Cabot M.D.   On: 02/22/2017 22:44   Ct Lumbar Spine Wo Contrast  Result Date: 02/23/2017 CLINICAL DATA:  Fall from ladder EXAM: CT LUMBAR SPINE WITHOUT CONTRAST TECHNIQUE: Multidetector CT imaging of the lumbar spine was performed without intravenous contrast administration. Multiplanar CT image reconstructions were also generated. COMPARISON:  Lumbar spine radiograph 02/22/2017 FINDINGS: Segmentation: 5 lumbar type vertebrae. Alignment: Grade 1 L4-L5 anterolisthesis secondary to advanced facet hypertrophy. Vertebrae: There is a wedge compression fracture of L2 involving the superior endplate with approximately 25% anterior height loss. No retropulsion. No other vertebral fracture. Paraspinal and other soft tissues: There is  atherosclerotic calcification of the aorta and iliac arteries. Calcification in the right adrenal gland. Nonobstructing bilateral renal calculi. Disc levels: T12-L1: Normal disc space and facets. No spinal canal or neuroforaminal stenosis. L1-L2: Small central disc protrusion.  No stenosis. L2-L3: Small disc bulge without stenosis. L3-L4: Severe bilateral facet hypertrophy with mild bilateral neural foraminal stenosis. L4-L5: Severe bilateral facet hypertrophy with grade 1 anterolisthesis and a least moderate bilateral foraminal stenosis. L5-S1: Severe facet hypertrophy and small disc bulge. Moderate right  and severe left neural foraminal stenosis. Visualized sacrum: Normal. IMPRESSION: 1. Acute wedge compression fracture of L2 involving the superior endplate with no retropulsion. 2. Severe bilateral facet arthrosis at L3-S1 resulting in multilevel moderate to severe foraminal stenosis. 3.  Aortic Atherosclerosis (ICD10-I70.0). 4. Bilateral nonobstructive nephrolithiasis. Electronically Signed   By: Ulyses Jarred M.D.   On: 02/23/2017 02:12   Ct Ankle Left Wo Contrast  Result Date: 02/23/2017 CLINICAL DATA:  Ankle fracture after falling from a ladder. Status post external fixation. EXAM: CT OF THE LEFT ANKLE WITHOUT CONTRAST TECHNIQUE: Multidetector CT imaging of the left ankle was performed according to the standard protocol. Multiplanar CT image reconstructions were also generated. COMPARISON:  Ankle x-rays dated February 22, 2017. FINDINGS: Bones/Joint/Cartilage There is a highly comminuted fracture of the distal tibia, involving the tibial plafond, metadiaphysis, and medial malleolus. There is up to 10 mm of articular surface depression along the central medial aspect of the tibial plafond. There is a 13 mm tibial plafond fragment that is flipped superiorly. The medial malleolus fracture is distracted approximately 7 mm. There is a mildly displaced avulsion fracture of the lateral malleolus. There is a  comminuted, displaced fracture of the posterior lateral talar dome. There is a comminuted fracture of the lateral talar process. There is widening of the tibiotalar joint with multiple bony fragments in the joint space. Two external fixation screws are noted through the calcaneus. Mild degenerative changes of the midfoot. Os peroneum. Small plantar and Achilles enthesophytes. Ligaments Suboptimally assessed by CT. Muscles and Tendons The posterior tibialis and flexor digitorum longus tendons are surrounded by bony fragments (series 4, image 25). Portions of the peroneal tendons herniate into the lateral malleolus avulsion fracture. Dystrophic calcifications within the distal Achilles tendon, consistent with prior injury/tendinosis. Soft tissues Diffuse soft tissue swelling about the ankle. IMPRESSION: 1. Highly comminuted fracture of the distal tibia involving the metadiaphysis, medial malleolus, and tibial plafond, with up to 10 mm of articular surface depression, as described above. 2. Avulsion fracture of the lateral malleolus. 3. Comminuted fractures of the posterolateral talar dome and lateral talar process. 4. Potential entrapment of the posterior tibialis, flexor digitorum longus, and peroneal tendons. Electronically Signed   By: Titus Dubin M.D.   On: 02/23/2017 08:40   Dg Ankle Left Port  Result Date: 02/22/2017 CLINICAL DATA:  Pain following fall EXAM: PORTABLE LEFT ANKLE - 2 VIEW COMPARISON:  None. FINDINGS: Frontal and lateral views were obtained it. There is a comminuted fracture of the distal tibia with displaced fracture fragments distally. Fracture extends to the tibial plafond with gross ankle mortise disruption. There is avulsion of the medial malleolus. Fibula does not appear fractured. There is associated soft tissue swelling. There are small calcaneal spurs. IMPRESSION: Comminuted fracture of the distal tibia extending from the diaphysis seal region through the metaphysis and into the  plafond. There is avulsion of the medial malleolus from the remainder of the tibia. There is gross ankle mortise disruption. Fibula appears intact. Electronically Signed   By: Lowella Grip III M.D.   On: 02/22/2017 18:08   Dg Foot 2 Views Left  Result Date: 02/22/2017 CLINICAL DATA:  Fall from ladder, left ankle comminuted fracture dislocation EXAM: LEFT FOOT - 2 VIEW COMPARISON:  02/22/2017 FINDINGS: Acute comminuted left ankle fracture dislocation noted. Soft tissue swelling. Bones are osteopenic. Chronic fusion of the left first MTP joint. No other acute fracture involving the foot. IMPRESSION: Acute comminuted left ankle fracture dislocation. Chronic left first MTP joint  ankylosis. Electronically Signed   By: Jerilynn Mages.  Shick M.D.   On: 02/22/2017 18:09   Dg C-arm 1-60 Min  Result Date: 02/22/2017 CLINICAL DATA:  External Fixation Left Ankle. Fluoro time: 42 sec EXAM: LEFT ANKLE - 2 VIEW; DG C-ARM 61-120 MIN COMPARISON:  Plain film of the left ankle from earlier same day. FINDINGS: Seven intraoperative fluoroscopic images are provided. Final images show significantly improved osseous alignment at the left ankle with symmetric ankle mortise. Fluoroscopy was provided for 42 seconds. IMPRESSION: Intraoperative fluoroscopic images showing significant interval improvement of the osseous alignment at the left ankle. Electronically Signed   By: Franki Cabot M.D.   On: 02/22/2017 22:44     Scheduled Meds: . allopurinol  300 mg Oral Daily  . amLODipine  10 mg Oral Daily  . aspirin EC  81 mg Oral Daily  . Chlorhexidine Gluconate Cloth  6 each Topical Daily  . docusate sodium  100 mg Oral BID  . enoxaparin (LOVENOX) injection  40 mg Subcutaneous Daily  . famotidine  20 mg Oral BID  . furosemide  40 mg Oral QODAY  . levothyroxine  25 mcg Oral QAC breakfast  . mupirocin ointment  1 application Nasal BID  . polyethylene glycol  17 g Oral Daily  . primidone  100 mg Oral QHS  . primidone  150 mg Oral  Daily  . rosuvastatin  5 mg Oral Daily  . senna  2 tablet Oral QHS   Continuous Infusions:   LOS: 2 days    Time spent: 30 min    Janece Canterbury, MD [facility] Pager [phone]  If 7PM-7AM, please contact night-coverage [email or web address] Password [facility] 02/24/2017, 5:19 PM

## 2017-02-24 NOTE — Progress Notes (Addendum)
   Assessment / Plan: 2 Days Post-Op  S/P Procedure(s) (LRB): EXTERNAL FIXATION LEFT ANKLE (Left) by Dr. Ernesta Amble. 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 Alain Marion conferring with ortho trauma - Dr. [name].  They will discuss recent CT and surgical options with patient.  Weight Bearing: Non Weight Bearing (NWB)  Dressings: Maintain.  VTE prophylaxis: Lovenox, SCDs, ambulation Dispo: Likely will remain inpatient while swelling improves and mobilizes with therapy.  Surgical planning in process.  Subjective: Patient reports pain as moderate. Pain control fair.   Urinating. +Flatus.   Objective:   VITALS:   Vitals:   02/23/17 1255 02/23/17 1457 02/23/17 2114 02/24/17 0559  BP: 139/65 133/60 (!) 148/64 (!) 150/70  Pulse: 68 71 67 64  Resp: 17 18 16 16   Temp: 99.6 F (37.6 C) 99.1 F (37.3 C) 98.7 F (37.1 C) 99.4 F (37.4 C)  TempSrc: Oral Oral Oral Oral  SpO2: 98% 95% 93% 98%  Weight:      Height:       CBC Latest Ref Rng & Units 02/22/2017 11/16/2016 10/26/2016  WBC 4.0 - 10.5 K/uL 14.1(H) 6.4 5.8  Hemoglobin 13.0 - 17.0 g/dL 14.6 14.5 14.1  Hematocrit 39.0 - 52.0 % 43.1 42.2 42.6  Platelets 150 - 400 K/uL 149(L) 143(L) 129.0(L)   BMP Latest Ref Rng & Units 02/22/2017 11/16/2016 10/26/2016  Glucose 65 - 99 mg/dL 117(H) 116(H) 116(H)  BUN 6 - 20 mg/dL 14 20 18   Creatinine 0.61 - 1.24 mg/dL  0.91 0.99 1.11  Sodium 135 - 145 mmol/L 141 143 146(H)  Potassium 3.5 - 5.1 mmol/L 3.6 4.1 4.7  Chloride 101 - 111 mmol/L 108 104 107  CO2 22 - 32 mmol/L 24 29 28   Calcium 8.9 - 10.3 mg/dL 9.0 9.4 9.9   Intake/Output      09/20 0701 - 09/21 0700 09/21 0701 - 09/22 0700   P.O. 840    Total Intake(mL/kg) 840 (9.1)    Urine (mL/kg/hr) 1400 (0.6)    Total Output 1400     Net -560            Physical Exam: General: NAD.  Upright in bed.  Calm, pleasant, conversant.  No increased wob MSK  LLE Ex-Fix in place.   Sensation intact distally - improved from pre-op exam. Foot warm EHL / FHL intact Incision: dressing C/D/I Compartments soft   Prudencio Burly III, [name] 02/24/2017, 8:05 AM

## 2017-02-24 NOTE — Telephone Encounter (Signed)
Returned the call to the patient. Message left with the wife, per dpr, to call back.

## 2017-02-24 NOTE — Telephone Encounter (Signed)
Returned call to [place] recommendations given.

## 2017-02-24 NOTE — Telephone Encounter (Signed)
Returned the call to the patient's girlfriend. She stated that the patient is currently in the hospital. He fell from a roof and will require surgery. He as an appointment on 10/25 with Dr. [name]. He had recently been to see Dr. Carles Collet. Per the girlfriend, the physician was concerned about his carotids therefore the appointment was made with Dr. [name].  The girlfriend stated that the patient will not be able to come to the appointment due to a pending surgery and recovery time. She would like to know if the patient needs to be seen by cardiology while in the hospital or if the concern over his carotids may wait. Will route to the provider for his recommendation.

## 2017-02-24 NOTE — Telephone Encounter (Signed)
Follow up    Pt girlfriend called back , returning [place] call

## 2017-02-24 NOTE — Telephone Encounter (Signed)
Troy Banks has been admitted to the [place] Room 22.He fell of the roof and  Broke his  Tibia and shattered Left Hill. This happened the same day he was in the office to see Dr. Carles Collet on  02/22/17. Thanks

## 2017-02-24 NOTE — Progress Notes (Signed)
Placed patient on home CPAP machine for the night. Sp02=95%

## 2017-02-25 LAB — GLUCOSE, CAPILLARY
GLUCOSE-CAPILLARY: 106 mg/dL — AB (ref 65–99)
Glucose-Capillary: 110 mg/dL — ABNORMAL HIGH (ref 65–99)

## 2017-02-25 MED ORDER — TIZANIDINE HCL 4 MG PO TABS
4.0000 mg | ORAL_TABLET | Freq: Four times a day (QID) | ORAL | Status: DC | PRN
  Administered 2017-02-25 – 2017-02-27 (×5): 4 mg via ORAL
  Filled 2017-02-25 (×5): qty 1

## 2017-02-25 MED ORDER — BISACODYL 10 MG RE SUPP
10.0000 mg | Freq: Once | RECTAL | Status: DC
  Filled 2017-02-25: qty 1

## 2017-02-25 NOTE — Progress Notes (Signed)
PROGRESS NOTE  Troy Banks  GEZ:[id number] DOB: 10-Feb-1941 DOA: 02/22/2017 PCP: [name], MD  Brief Narrative:   Troy Banks is a 76 y.o. male with medical history significant of CAD s/p CABG, AS s/p AVR, diastolic CHF with preserved EF, HTN.  Patient presented to the ED after falling off of a roof on the day of admission.  Had been feeling fine just before the fall.  Landed on L ankle.  Severe L ankle pain and deformity after fall.  Underwent external fixation on 9/19 by Dr. Percell Miller.  He also reported some back pain and was found to have an acute L2 compression fracture.  He was put in a back brace.  PT/OT now recommending SNF.  Could not discharge today or tomorrow.  Earliest would be Monday, but no point if he has to have surgery on Tuesday.  Anticipate to SNF post-surgery for recovery.  Will place consult for SW to start process.    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  Dr. [name] has been asked to review case to make a recommendation regarding plan of care.   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  Also had AVR -  EF 55-60% -  Stress test 4 month ago low risk  Chronic diastolic heart  failure -  Continue lasix  Hypertension, blood pressure mildly elevated -  Continue norvasc  Hypothyroidism, stable, continue synthroid  OSA, stable, continue cpap  Diabetes mellitus type 2, A1c 5.9 10/26/2016 -  CBG well controlled without insulin  DVT prophylaxis:  lovenox Code Status:  Full code Family Communication:  Patient alone Disposition Plan:  Awaiting Dr. [name] recommendation.  Not able to return home as wife has lifting restriction.  Plan for SNF when ready for discharge.    Consultants:   Neurosurgery, Dr. Hurman Horn Orthopedic surgery  Procedures:  External fixation of left ankle fracture  Antimicrobials:  Anti-infectives    Start     Dose/Rate Route Frequency Ordered Stop   02/22/17 2100  ceFAZolin (ANCEF) IVPB 2g/100 mL premix     2 g 200 mL/hr over 30 Minutes Intravenous  Once 02/22/17 2029 02/22/17 2135   02/22/17 2031  ceFAZolin (ANCEF) 2-4 GM/100ML-% IVPB    Comments:  Troy Banks   : cabinet override      02/22/17 2031 02/22/17 2105       Subjective:  Denies chest pains, shortness of breath. He has been having severe lightening-like pains due to muscle spasms in his left leg. He received a dose of tizanidine which is the only muscle relaxer he can take last night and that seemed to help. He  feels the swelling has gone down in his left ankle son.  Objective: Vitals:   02/24/17 2103 02/24/17 2117 02/25/17 0546 02/25/17 1412  BP:  (!) 158/80 122/60 (!) 101/48  Pulse: 72 70 (!) 53 (!) 58  Resp: 18 19 19 18   Temp:  99.9 F (37.7 C) 97.8 F (36.6 C) 98.2 F (36.8 C)  TempSrc:  Oral Oral Oral  SpO2: 95% 96% 99% 90%  Weight:      Height:        Intake/Output Summary (Last 24 hours) at 02/25/17 1612 Last data filed at 02/25/17 1100  Gross per 24 hour  Intake                0 ml  Output             1100 ml  Net            -1100 ml   Filed Weights   02/22/17 1627  Weight: 92.1 kg (203 lb)    Examination:  General exam:   Adult Male.  No acute distress.  HEENT:  NCAT, MMM Respiratory system: Clear to auscultation bilaterally Cardiovascular system: Regular rate and rhythm, 3/6 systolic murmur.  Warm extremities Gastrointestinal system: Normal active bowel sounds, soft, nondistended, nontender. MSK:  Normal tone and bulk, left lower extremity with Ace bandage, in external fixation. Toes look even less swollen than yesterday. Less than 2 second capillary refill, sensation intact to light touch, able to wiggle his toes Neuro:  Grossly intact   Data Reviewed: I have personally reviewed following labs and imaging studies  CBC:  Recent Labs Lab 02/22/17 1836  WBC 14.1*  NEUTROABS 11.4*  HGB 14.6  HCT 43.1  MCV 88.9  PLT 361*   Basic Metabolic Panel:  Recent Labs Lab 02/22/17 1836  NA 141  K 3.6  CL 108  CO2 24  GLUCOSE 117*  BUN 14  CREATININE 0.91  CALCIUM 9.0   GFR: Estimated Creatinine Clearance: 78.5 mL/min (by C-G formula based on SCr of 0.91 mg/dL). Liver Function Tests: No results for input(s): AST, ALT, ALKPHOS, BILITOT, PROT, ALBUMIN in the last 168 hours. No results for input(s): LIPASE, AMYLASE in the last 168 hours. No results for input(s): AMMONIA in the last 168 hours. Coagulation Profile: No results for input(s): INR, PROTIME in the last 168 hours. Cardiac Enzymes: No results for input(s): CKTOTAL, CKMB, CKMBINDEX, TROPONINI in the last 168 hours. BNP (last 3 results) No results for input(s): PROBNP in the last 8760 hours. HbA1C: No results for input(s): HGBA1C in the last 72 hours. CBG:  Recent Labs Lab 02/24/17 0708 02/24/17 1157 02/24/17 1710 02/24/17 2224 02/25/17 0708  GLUCAP 129* 105* 126* 120* 110*   Lipid Profile: No results for input(s): CHOL, HDL, LDLCALC, TRIG, CHOLHDL, LDLDIRECT in the last 72 hours. Thyroid Function Tests: No results for input(s): TSH, T4TOTAL, FREET4, T3FREE, THYROIDAB in the last 72 hours. Anemia Panel: No results for input(s):  VITAMINB12, FOLATE, FERRITIN, TIBC, IRON, RETICCTPCT in the last 72 hours. Urine analysis:    Component Value Date/Time   COLORURINE YELLOW 10/26/2016 0935   APPEARANCEUR CLEAR 10/26/2016 0935   LABSPEC <=1.005 (A) 10/26/2016 0935   PHURINE 6.0 10/26/2016 0935   GLUCOSEU NEGATIVE 10/26/2016 0935   HGBUR LARGE (A) 10/26/2016 0935   BILIRUBINUR NEGATIVE 10/26/2016 0935   KETONESUR NEGATIVE 10/26/2016 0935   PROTEINUR NEGATIVE 03/28/2015 1918   UROBILINOGEN 0.2 10/26/2016 0935   NITRITE NEGATIVE 10/26/2016 0935   LEUKOCYTESUR NEGATIVE 10/26/2016  0935   Sepsis Labs: @LABRCNTIP (procalcitonin:4,lacticidven:4)  ) Recent Results (from the past 240 hour(s))  Surgical pcr screen     Status: Abnormal   Collection Time: 02/22/17  8:49 PM  Result Value Ref Range Status   MRSA, PCR NEGATIVE NEGATIVE Final   Staphylococcus aureus POSITIVE (A) NEGATIVE Final    Comment: (NOTE) The Xpert SA Assay (FDA approved for NASAL specimens in patients 67 years of age and older), is one component of a comprehensive surveillance program. It is not intended to diagnose infection nor to guide or monitor treatment.       Radiology Studies: No results found.   Scheduled Meds: . allopurinol  300 mg Oral Daily  . amLODipine  10 mg Oral Daily  . aspirin EC  81 mg Oral Daily  . Chlorhexidine Gluconate Cloth  6 each Topical Daily  . docusate sodium  100 mg Oral BID  . enoxaparin (LOVENOX) injection  40 mg Subcutaneous Daily  . famotidine  20 mg Oral BID  . furosemide  40 mg Oral QODAY  . levothyroxine  25 mcg Oral QAC breakfast  . mupirocin ointment  1 application Nasal BID  . polyethylene glycol  17 g Oral Daily  . primidone  100 mg Oral QHS  . primidone  150 mg Oral Daily  . rosuvastatin  5 mg Oral Daily  . senna  2 tablet Oral QHS   Continuous Infusions:   LOS: 3 days    Time spent: 30 min    Janece Canterbury, MD [facility] Pager [phone]  If 7PM-7AM, please contact  night-coverage [email or web address] Password [facility] 02/25/2017, 4:12 PM

## 2017-02-25 NOTE — Consult Note (Signed)
Orthopaedic Trauma Service (OTS) Consult   Patient ID: Troy Banks MRN: [id number] DOB/AGE: [birth year] 74 y.o.   Reason for Consult:Left pilon fracture Referring Physician: Dr. Fredonia Highland, MD of [facility]  HPI: Troy Banks is an 76 y.o. male who is being seen in consultation at the request of Dr. Percell Miller for evaluation of his left pilon fracture. He was cleaning his gutters on a ladder when he fell and landed on his foot. He had immediate pain and deformity. He was evaluated in the ER where he was taken later that night for closed reduction and external fixation of his pilon fracture by Dr. Percell Miller. A postoperative CT scan was obtained which showed a significant pilon fracture with a very comminuted joint. I was asked to review the films and discuss options with the patient.  Past Medical History:  Diagnosis Date  . Allergy   . Anxiety    PTSD  . Aortic stenosis, moderate 03/27/2015   post AVR echo Echo 11/16: Mild LVH, EF 55-60%, normal wall motion, grade 1 diastolic dysfunction, bioprosthetic AVR okay (mean gradient 18 mmHg) with trivial AI, mild LAE, atrial septal aneurysm, small effusion   . Arthritis    "qwhere" (10/29/2015)  . Carotid artery stenosis    Carotid US 11/17: 1-39% bilateral ICA stenosis. F/u prn  . Cervical stenosis of spinal canal    fusion 2006  . CHF (congestive heart failure) ([place])    "secondry to OHS"  . COLONIC POLYPS, HX OF   . Complication of anesthesia    "takes a lot to put him under and has woken up during surgery" Difficult to wake up . PTSD; "does not metabolize RX well"  . DIVERTICULOSIS, COLON   . Dysrhythmia    2 bouts of afib post op and at home but corrected with medication.  . Familial tremor 03/19/2014  . GERD (gastroesophageal reflux disease)    uses Zantac  . GOUT   . Headache   . Heart murmur   . High cholesterol   . History of kidney stones   . HYPERTENSION    off ACEI 2010 because of hyperkalemia in  setting of ARI  . Hypothyroidism    newly diaganosed 09/2015  . Kidney stones    remote open stone-retrieval on L  . Myocardial infarction [facility])    October 20th 2016  . OSA on CPAP   . 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 PTSD (post-traumatic stress disorder)   . Sleep apnea    On a CPAP    Past Surgical History:  Procedure Laterality Date  . ANTERIOR FUSION CERVICAL SPINE  2002  . AORTIC VALVE REPLACEMENT N/A 03/30/2015   Procedure: AORTIC VALVE REPLACEMENT (AVR);  Surgeon: Grace Isaac, MD;  Location: [place];  Service: Open Heart Surgery;  Laterality: N/A;  . BACK SURGERY    . CARDIAC CATHETERIZATION N/A 03/26/2015   Procedure: Left Heart Cath and Coronary Angiography;  Surgeon: Leonie Man, MD;  Location: [place] [facility];  Service: Cardiovascular;  Laterality: N/A;  . CARDIAC VALVE REPLACEMENT    . CORONARY ARTERY BYPASS GRAFT N/A 03/30/2015   Procedure: CORONARY ARTERY BYPASS GRAFTING (CABG) x four, using left internal mammary artery and left    leg greater saphenous vein harvested endoscopically ;  Surgeon: Grace Isaac, MD;  Location: [place];  Service: Open Heart Surgery;  Laterality: N/A;  . CYSTOSCOPY/URETEROSCOPY/HOLMIUM LASER/STENT PLACEMENT  Bilateral 11/07/2016   Procedure: CYSTOSCOPY/URETEROSCOPY/HOLMIUM LASER/STENT PLACEMENT;  Surgeon: Nickie Retort, MD;  Location: [facility];  Service: Urology;  Laterality: Bilateral;  . CYSTOSCOPY/URETEROSCOPY/HOLMIUM LASER/STENT PLACEMENT Bilateral 11/23/2016   Procedure: CYSTOSCOPY/BILATERAL URETEROSCOPY/HOLMIUM LASER/STENT EXCHANGE;  Surgeon: Nickie Retort, MD;  Location: [facility];  Service: Urology;  Laterality: Bilateral;  NEEDS 90 MIN   . EXTERNAL FIXATION LEG Left 02/22/2017   Procedure: EXTERNAL FIXATION LEFT ANKLE;  Surgeon: Renette Butters, MD;  Location: [place];  Service: Orthopedics;  Laterality: Left;  . HEMORRHOID BANDING  1970s  .  INGUINAL HERNIA REPAIR Left 1962  . JOINT REPLACEMENT    . [place]   "cut me open"  . POSTERIOR FUSION CERVICAL SPINE  2006  . TEE WITHOUT CARDIOVERSION N/A 03/30/2015   Procedure: TRANSESOPHAGEAL ECHOCARDIOGRAM (TEE);  Surgeon: Grace Isaac, MD;  Location: [place];  Service: Open Heart Surgery;  Laterality: N/A;  . TESTICLE TORSION REDUCTION Right 1960  . TOE FUSION Left 1985 & 2004   "great toe"  . TONSILLECTOMY    . TOTAL HIP ARTHROPLASTY Right 10/27/2015   Procedure: RIGHT TOTAL HIP ARTHROPLASTY ANTERIOR APPROACH and steroid injection right foot;  Surgeon: Mcarthur Rossetti, MD;  Location: [place];  Service: Orthopedics;  Laterality: Right;    Family History  Problem Relation Age of Onset  . Heart disease Mother   . Hypertension Mother   . Diabetes Mother   . Heart disease Father   . Hypertension Father   . Prostate cancer Maternal Uncle   . Hypertension Maternal Grandmother   . Heart disease Maternal Grandmother   . Hypertension Maternal Grandfather   . Heart disease Maternal Grandfather   . Hypertension Paternal Grandfather   . Heart disease Paternal Grandfather   . Diabetes Paternal Grandfather   . Alcohol abuse Other   . Arthritis Other   . Colon polyps Neg Hx   . Esophageal cancer Neg Hx   . Rectal cancer Neg Hx   . Stomach cancer Neg Hx     Social History:  reports that he quit smoking about 26 years ago. His smoking use included Cigarettes. He has a 34.00 pack-year smoking history. He has never used smokeless tobacco. He reports that he drinks about 0.6 oz of alcohol per week . He reports that he does not use drugs.  Allergies:  Allergies  Allergen Reactions  . Benzodiazepines Other (See Comments)    Patient states he gets stroke symptoms with benzo's.   . Coreg [Carvedilol] Other (See Comments)    Accumulates and causes stroke-like side-effects due to 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 Patient poor metabolizer of CYP2D6 - Coreg  undergoes extensive hepatic (including 2D6)  . Lisinopril Other (See Comments)    Hyperkalemia. 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  . Mobic [Meloxicam] Other (See Comments)    Accumulates and causes stroke-like side-effects due to 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  . Sertraline Hcl Other (See Comments)    Accumulates and causes stroke-like side-effects due to 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  . Tramadol Other (See Comments)    Accumulates and causes stroke-like side-effects due to 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  . Atorvastatin Other (See Comments)    MYALGIAS  . Colchicine Other (See Comments)    ataxia  . Compazine [Prochlorperazine Maleate] Other (See Comments)    Accumulates and causes stroke-like side-effects due to 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  . Flomax [Tamsulosin Hcl]     Accumulates and causes  stroke-like side-effects due to 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  . Ondansetron Other (See Comments)    Accumulates and causes stroke-like side-effects due to 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   . Promethazine Other (See Comments)    Accumulates and causes stroke-like side-effects due to 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  . Acyclovir And Related Other (See Comments)    Accumulates and causes stroke-like side-effects due to 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    Medications: I have reviewed the patient's current medications.  ROS: Negative for fevers, chills, SOB, chest pain, bowel or bladder problems.  Blood pressure 122/60, pulse (!) 53, temperature 97.8 F (36.6 C), temperature source Oral, resp. rate 19, height 5\' 8"  (1.727 m), weight 92.1 kg (203 lb), SpO2 99 %. Exam: General-well appearing male, no acute distress, awake alert and oriented. Cooperative and pleasant LLE: ex fix in place. Pin sites were just redressed and clean at the time of eval. ACE wrap in place that was removed during exam and significant swelling to  the anterior and lateral ankle without any skin wrinkling. There is minimal wrinkling posterolaterally. There is a small serous fracture blister anteromedially. Full sensation to foot and toes. +EHL/FHL and warm and well perfused foot. Some ecchymosis. No deformity or instability of knee or hip.  RLE: Skin without lesions, no tenderness to palpation, full painless ROM with full strength in each muscle groups without evidence of instability.  Imaging: X-rays preoperatively show a comminuted pilon fracture with significant shortening. The talus in proximally migrated with notable varus.  CT scan was reviewed as well which shows a significantly comminuted pilon fracture with impaction of central articular surface with significant bone voids. There are some articular fragments that are flipped 180 degrees.  Assessment/Plan: 76 year old male with significantly comminuted pilon fracture of left ankle s/p external fixation  After reviewing the films and discussing options with the patient I believe that primary fusion is the best option. I feel that an attempt at ORIF will have high risk of failure with pain and postraumatic arthritis with a high likelihood of reoperative. A primary fusion would limit ankle ROM but would allow one surgery and in my opinion provide the most predictable and best outcome. I feel that currently he is too swollen for an anterior or lateral approach.  -NWB LLE -Elevation and compression to help with swelling -Will evaluate skin on Monday and make a determination about surgical fixation next week  Shona Needles, MD [facility] [phone] (phone)

## 2017-02-25 NOTE — Progress Notes (Signed)
Physical Therapy Treatment Patient Details Name: Troy Banks MRN: [id number] DOB: 1941-03-21 Today's Date: 02/25/2017    History of Present Illness Troy Banks is a 76 y.o. male with medical history significant of CAD s/p CABG, AS s/p AVR, diastolic CHF with preserved EF, HTN.  Patient admitted due to fall from roof with L ankle fx/disclocation now s/p external fixation and L2 compression fx.     PT Comments    Pt presents with dependencies in mobility secondary to L ankle fx and compression fx. Pt is currently progressing with mobility. Pt is ambulating with Min assist with RW 22 feet L LE NWB. Pt is limited by WB status and pain. Pt requires cues for back precautions with bed mobility. Pt is currently able to don spinal brace with verbal cues and supervision. Pt will continue to benefit from acute skilled PT to progress safety with mobility and independence. I recommend D/C to SNF facility for therapy prior to d/c home secondary to limited mobility and decreased safety. D/C plan has been updated and pt and physician agree.   Follow Up Recommendations  SNF;DC plan and follow up therapy as arranged by surgeon     Equipment Recommendations  None recommended by PT    Recommendations for Other Services       Precautions / Restrictions Precautions Precautions: Fall;Back Precaution Comments: Reviewed back precautions Required Braces or Orthoses: Spinal Brace Spinal Brace: Lumbar corset;Applied in sitting position Restrictions LLE Weight Bearing: Non weight bearing    Mobility  Bed Mobility Overal bed mobility: Needs Assistance Bed Mobility: Supine to Sit     Supine to sit: Min assist     General bed mobility comments: cues for sequencing and technique for log roll; assist to mobilize L LE   Transfers Overall transfer level: Needs assistance Equipment used: Rolling walker (2 wheeled) Transfers: Sit to/from Stand Sit to Stand: Min guard         General  transfer comment: cues for hand placement and technique  Ambulation/Gait Ambulation/Gait assistance: Min assist Ambulation Distance (Feet): 23 Feet Assistive device: Rolling walker (2 wheeled) Gait Pattern/deviations: Step-to pattern Gait velocity: decreased   General Gait Details: cues to maintain NWB with fatigue   Stairs            Wheelchair Mobility    Modified Rankin (Stroke Patients Only)       Balance Overall balance assessment: Needs assistance Sitting-balance support: No upper extremity supported Sitting balance-Leahy Scale: Good     Standing balance support: Bilateral upper extremity supported Standing balance-Leahy Scale: Fair                              Cognition Arousal/Alertness: Awake/alert Behavior During Therapy: WFL for tasks assessed/performed Overall Cognitive Status: Within Functional Limits for tasks assessed                                        Exercises Total Joint Exercises Ankle Circles/Pumps: AROM;Left;10 reps;Seated Quad Sets: AROM;Strengthening;Left;10 reps;Seated Heel Slides: AAROM;Strengthening;Left;10 reps;Seated Hip ABduction/ADduction: AAROM;Strengthening;Left;10 reps;Seated Straight Leg Raises: AAROM;Strengthening;Left;10 reps;Seated    General Comments        Pertinent Vitals/Pain Pain Assessment: 0-10 Pain Score: 4  Pain Location: L LE and back Pain Descriptors / Indicators: Discomfort Pain Intervention(s): Limited activity within patient's tolerance;Repositioned    Home Living  Prior Function            PT Goals (current goals can now be found in the care plan section) Progress towards PT goals: Progressing toward goals    Frequency    Min 5X/week      PT Plan Discharge plan needs to be updated    Co-evaluation              AM-PAC PT "6 Clicks" Daily Activity  Outcome Measure  Difficulty turning over in bed (including adjusting  bedclothes, sheets and blankets)?: A Little Difficulty moving from lying on back to sitting on the side of the bed? : A Little Difficulty sitting down on and standing up from a chair with arms (e.g., wheelchair, bedside commode, etc,.)?: A Little Help needed moving to and from a bed to chair (including a wheelchair)?: A Little Help needed walking in hospital room?: A Little Help needed climbing 3-5 steps with a railing? : Total 6 Click Score: 16    End of Session Equipment Utilized During Treatment: Gait belt Activity Tolerance: Patient limited by lethargy Patient left: in chair;with call bell/phone within reach;with family/visitor present Nurse Communication: Mobility status PT Visit Diagnosis: Difficulty in walking, not elsewhere classified (R26.2);Pain Pain - Right/Left: Left Pain - part of body: Ankle and joints of foot     Time: 6384-6659 PT Time Calculation (min) (ACUTE ONLY): 33 min  Charges:  $Gait Training: 8-22 mins $Therapeutic Exercise: 8-22 mins                    G Codes:       Troy Banks, PT   Troy Banks 02/25/2017, 12:59 PM

## 2017-02-26 LAB — GLUCOSE, CAPILLARY
GLUCOSE-CAPILLARY: 104 mg/dL — AB (ref 65–99)
GLUCOSE-CAPILLARY: 124 mg/dL — AB (ref 65–99)
Glucose-Capillary: 128 mg/dL — ABNORMAL HIGH (ref 65–99)

## 2017-02-26 NOTE — Progress Notes (Signed)
Pt has home CPAP at bedside and states he will place on when ready.

## 2017-02-26 NOTE — Progress Notes (Signed)
PROGRESS NOTE  Troy Banks  OHY:[id number] DOB: [birth year] DOA: 02/22/2017 PCP: [name], MD  Brief Narrative:   Troy Banks is a 76 y.o. male with medical history significant of CAD s/p CABG, AS s/p AVR, diastolic CHF with preserved EF, HTN.  Patient presented to the ED after falling off of a roof on the day of admission.  Had been feeling fine just before the fall.  Landed on L ankle.  Severe L ankle pain and deformity after fall.  Underwent external fixation on 9/19 by Dr. Percell Miller.  He also reported some back pain and was found to have an acute L2 compression fracture.  He was put in a back brace.  PT/OT now recommending SNF.  Could not discharge today or tomorrow.  Earliest would be Monday, but no point if he has to have surgery on Tuesday.  Anticipate to SNF post-surgery for recovery.  SW consulted.     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  Also had AVR -  EF 55-60% -  Stress test 4 month ago low risk  Chronic diastolic heart failure -  Continue lasix  Hypertension, blood pressure mildly elevated -  Continue norvasc  Hypothyroidism, stable, continue synthroid  OSA, stable,  continue cpap  Diabetes mellitus type 2, A1c 5.9 10/26/2016 -  CBG well controlled without insulin  DVT prophylaxis:  lovenox Code Status:  Full code Family Communication:  Patient alone Disposition Plan:  Dr. [name] is trying to find special piece of equipment to do a posterior approach joint fusion.  If he is unable to locate his preferred piece of equipment, patient will discharge to SNF and follow up in one week.   Consultants:   Neurosurgery, Dr. Hurman Horn Orthopedic surgery  Procedures:  External fixation of left ankle fracture  Antimicrobials:  Anti-infectives    Start     Dose/Rate Route Frequency Ordered Stop   02/22/17 2100  ceFAZolin (ANCEF) IVPB 2g/100 mL premix     2 g 200 mL/hr over 30 Minutes Intravenous  Once 02/22/17 2029 02/22/17 2135   02/22/17 2031  ceFAZolin (ANCEF) 2-4 GM/100ML-% IVPB    Comments:  Grace Blight   : cabinet override      02/22/17 2031 02/22/17 2105       Subjective:  Did not sleep well last night.  Denies SOB, Chest pain.  Still somewhat anxious about having his joint fusion.    Objective: Vitals:   02/25/17 0546 02/25/17 1412 02/25/17 2018 02/26/17 0552  BP: 122/60 (!) 101/48 (!) 123/91 (!) 163/69  Pulse: (!) 53 (!) 58 68 62  Resp: 19 18 18  18  Temp: 97.8 F (36.6 C) 98.2 F (36.8 C) 98 F (36.7 C) 97.8 F (36.6 C)  TempSrc: Oral Oral Oral Oral  SpO2: 99% 90% 95% 97%  Weight:      Height:        Intake/Output Summary (Last 24 hours) at 02/26/17 1422 Last data filed at 02/26/17 3716  Gross per 24 hour  Intake                0 ml  Output             1850 ml  Net            -1850 ml   Filed Weights   02/22/17 1627  Weight: 92.1 kg (203 lb)    Examination:  General exam:  Adult male.  No acute distress.  HEENT:  NCAT, MMM Respiratory system: Clear to auscultation bilaterally Cardiovascular system: Regular rate and rhythm, 3/6 systolic murmur Gastrointestinal system: Normal active bowel sounds, soft,  nondistended, nontender. MSK:  Normal tone and bulk, left lower extremity with ACE bandage, in external fixation.  Toes still somewhat swollen, less than 2 sec CR, SITLT, able to wiggle toes.   Neuro:  Grossly intact  Data Reviewed: I have personally reviewed following labs and imaging studies  CBC:  Recent Labs Lab 02/22/17 1836  WBC 14.1*  NEUTROABS 11.4*  HGB 14.6  HCT 43.1  MCV 88.9  PLT 967*   Basic Metabolic Panel:  Recent Labs Lab 02/22/17 1836  NA 141  K 3.6  CL 108  CO2 24  GLUCOSE 117*  BUN 14  CREATININE 0.91  CALCIUM 9.0   GFR: Estimated Creatinine Clearance: 78.5 mL/min (by C-G formula based on SCr of 0.91 mg/dL). Liver Function Tests: No results for input(s): AST, ALT, ALKPHOS, BILITOT, PROT, ALBUMIN in the last 168 hours. No results for input(s): LIPASE, AMYLASE in the last 168 hours. No results for input(s): AMMONIA in the last 168 hours. Coagulation Profile: No results for input(s): INR, PROTIME in the last 168 hours. Cardiac Enzymes: No results for input(s): CKTOTAL, CKMB, CKMBINDEX, TROPONINI in the last 168 hours. BNP (last 3 results) No results for input(s): PROBNP in the last 8760 hours. HbA1C: No results for input(s): HGBA1C in the last 72 hours. CBG:  Recent Labs Lab 02/24/17 1710 02/24/17 2224 02/25/17 0708 02/25/17 2046 02/26/17 1126  GLUCAP 126* 120* 110* 106* 128*   Lipid Profile: No results for input(s): CHOL, HDL, LDLCALC, TRIG, CHOLHDL, LDLDIRECT in the last 72 hours. Thyroid Function Tests: No results for input(s): TSH, T4TOTAL, FREET4, T3FREE, THYROIDAB in the last 72 hours. Anemia Panel: No results for input(s): VITAMINB12, FOLATE, FERRITIN, TIBC, IRON, RETICCTPCT in the last 72 hours. Urine analysis:    Component Value Date/Time   COLORURINE YELLOW 10/26/2016 0935   APPEARANCEUR CLEAR 10/26/2016 0935   LABSPEC <=1.005 (A) 10/26/2016 0935   PHURINE 6.0 10/26/2016 0935   GLUCOSEU NEGATIVE 10/26/2016 0935   HGBUR  LARGE (A) 10/26/2016 0935   BILIRUBINUR NEGATIVE 10/26/2016 0935   KETONESUR NEGATIVE 10/26/2016 0935   PROTEINUR NEGATIVE 03/28/2015 1918   UROBILINOGEN 0.2 10/26/2016 0935   NITRITE NEGATIVE 10/26/2016 0935   LEUKOCYTESUR NEGATIVE 10/26/2016 0935   Sepsis Labs: @LABRCNTIP (procalcitonin:4,lacticidven:4)  ) Recent Results (from the past 240 hour(s))  Surgical pcr screen     Status: Abnormal   Collection Time: 02/22/17  8:49 PM  Result Value Ref Range Status   MRSA, PCR NEGATIVE NEGATIVE Final   Staphylococcus aureus POSITIVE (A) NEGATIVE Final  Comment: (NOTE) The Xpert SA Assay (FDA approved for NASAL specimens in patients 48 years of age and older), is one component of a comprehensive surveillance program. It is not intended to diagnose infection nor to guide or monitor treatment.       Radiology Studies: No results found.   Scheduled Meds: . allopurinol  300 mg Oral Daily  . amLODipine  10 mg Oral Daily  . aspirin EC  81 mg Oral Daily  . bisacodyl  10 mg Rectal Once  . Chlorhexidine Gluconate Cloth  6 each Topical Daily  . docusate sodium  100 mg Oral BID  . enoxaparin (LOVENOX) injection  40 mg Subcutaneous Daily  . famotidine  20 mg Oral BID  . furosemide  40 mg Oral QODAY  . levothyroxine  25 mcg Oral QAC breakfast  . mupirocin ointment  1 application Nasal BID  . polyethylene glycol  17 g Oral Daily  . primidone  100 mg Oral QHS  . primidone  150 mg Oral Daily  . rosuvastatin  5 mg Oral Daily  . senna  2 tablet Oral QHS   Continuous Infusions:   LOS: 4 days    Time spent: 30 min    Janece Canterbury, MD [facility] Pager [phone]  If 7PM-7AM, please contact night-coverage [email or web address] Password [email or web address] 02/26/2017, 2:22 PM

## 2017-02-27 DIAGNOSIS — K219 Gastro-esophageal reflux disease without esophagitis: Secondary | ICD-10-CM | POA: Diagnosis not present

## 2017-02-27 DIAGNOSIS — S92909A Unspecified fracture of unspecified foot, initial encounter for closed fracture: Secondary | ICD-10-CM | POA: Diagnosis not present

## 2017-02-27 DIAGNOSIS — Z8249 Family history of ischemic heart disease and other diseases of the circulatory system: Secondary | ICD-10-CM | POA: Diagnosis not present

## 2017-02-27 DIAGNOSIS — R1311 Dysphagia, oral phase: Secondary | ICD-10-CM | POA: Diagnosis not present

## 2017-02-27 DIAGNOSIS — Z9189 Other specified personal risk factors, not elsewhere classified: Secondary | ICD-10-CM | POA: Diagnosis not present

## 2017-02-27 DIAGNOSIS — Y93H2 Activity, gardening and landscaping: Secondary | ICD-10-CM | POA: Diagnosis not present

## 2017-02-27 DIAGNOSIS — I110 Hypertensive heart disease with heart failure: Secondary | ICD-10-CM | POA: Diagnosis not present

## 2017-02-27 DIAGNOSIS — W11XXXA Fall on and from ladder, initial encounter: Secondary | ICD-10-CM | POA: Diagnosis present

## 2017-02-27 DIAGNOSIS — G8929 Other chronic pain: Secondary | ICD-10-CM | POA: Diagnosis not present

## 2017-02-27 DIAGNOSIS — Z86010 Personal history of colonic polyps: Secondary | ICD-10-CM | POA: Diagnosis not present

## 2017-02-27 DIAGNOSIS — I6523 Occlusion and stenosis of bilateral carotid arteries: Secondary | ICD-10-CM | POA: Diagnosis not present

## 2017-02-27 DIAGNOSIS — I5032 Chronic diastolic (congestive) heart failure: Secondary | ICD-10-CM | POA: Diagnosis not present

## 2017-02-27 DIAGNOSIS — M109 Gout, unspecified: Secondary | ICD-10-CM | POA: Diagnosis not present

## 2017-02-27 DIAGNOSIS — S32020D Wedge compression fracture of second lumbar vertebra, subsequent encounter for fracture with routine healing: Secondary | ICD-10-CM | POA: Diagnosis not present

## 2017-02-27 DIAGNOSIS — R488 Other symbolic dysfunctions: Secondary | ICD-10-CM | POA: Diagnosis not present

## 2017-02-27 DIAGNOSIS — G4733 Obstructive sleep apnea (adult) (pediatric): Secondary | ICD-10-CM | POA: Diagnosis not present

## 2017-02-27 DIAGNOSIS — G8911 Acute pain due to trauma: Secondary | ICD-10-CM | POA: Diagnosis not present

## 2017-02-27 DIAGNOSIS — S82892A Other fracture of left lower leg, initial encounter for closed fracture: Secondary | ICD-10-CM | POA: Diagnosis not present

## 2017-02-27 DIAGNOSIS — I252 Old myocardial infarction: Secondary | ICD-10-CM | POA: Diagnosis not present

## 2017-02-27 DIAGNOSIS — Z951 Presence of aortocoronary bypass graft: Secondary | ICD-10-CM | POA: Diagnosis not present

## 2017-02-27 DIAGNOSIS — E8889 Other specified metabolic disorders: Secondary | ICD-10-CM | POA: Diagnosis not present

## 2017-02-27 DIAGNOSIS — S82872A Displaced pilon fracture of left tibia, initial encounter for closed fracture: Secondary | ICD-10-CM | POA: Diagnosis not present

## 2017-02-27 DIAGNOSIS — S82892G Other fracture of left lower leg, subsequent encounter for closed fracture with delayed healing: Secondary | ICD-10-CM | POA: Diagnosis not present

## 2017-02-27 DIAGNOSIS — Z9181 History of falling: Secondary | ICD-10-CM | POA: Diagnosis not present

## 2017-02-27 DIAGNOSIS — S82392A Other fracture of lower end of left tibia, initial encounter for closed fracture: Secondary | ICD-10-CM | POA: Diagnosis not present

## 2017-02-27 DIAGNOSIS — S82899D Other fracture of unspecified lower leg, subsequent encounter for closed fracture with routine healing: Secondary | ICD-10-CM | POA: Diagnosis not present

## 2017-02-27 DIAGNOSIS — E039 Hypothyroidism, unspecified: Secondary | ICD-10-CM | POA: Diagnosis not present

## 2017-02-27 DIAGNOSIS — F329 Major depressive disorder, single episode, unspecified: Secondary | ICD-10-CM | POA: Diagnosis not present

## 2017-02-27 DIAGNOSIS — Z96641 Presence of right artificial hip joint: Secondary | ICD-10-CM | POA: Diagnosis not present

## 2017-02-27 DIAGNOSIS — I2510 Atherosclerotic heart disease of native coronary artery without angina pectoris: Secondary | ICD-10-CM | POA: Diagnosis not present

## 2017-02-27 DIAGNOSIS — Z833 Family history of diabetes mellitus: Secondary | ICD-10-CM | POA: Diagnosis not present

## 2017-02-27 DIAGNOSIS — Z888 Allergy status to other drugs, medicaments and biological substances status: Secondary | ICD-10-CM | POA: Diagnosis not present

## 2017-02-27 DIAGNOSIS — M6281 Muscle weakness (generalized): Secondary | ICD-10-CM | POA: Diagnosis not present

## 2017-02-27 DIAGNOSIS — E119 Type 2 diabetes mellitus without complications: Secondary | ICD-10-CM | POA: Diagnosis not present

## 2017-02-27 DIAGNOSIS — R2689 Other abnormalities of gait and mobility: Secondary | ICD-10-CM | POA: Diagnosis not present

## 2017-02-27 DIAGNOSIS — S82202A Unspecified fracture of shaft of left tibia, initial encounter for closed fracture: Secondary | ICD-10-CM | POA: Diagnosis not present

## 2017-02-27 DIAGNOSIS — Z87891 Personal history of nicotine dependence: Secondary | ICD-10-CM | POA: Diagnosis not present

## 2017-02-27 LAB — CBC
HEMATOCRIT: 38.3 % — AB (ref 39.0–52.0)
HEMOGLOBIN: 12.9 g/dL — AB (ref 13.0–17.0)
MCH: 29.9 pg (ref 26.0–34.0)
MCHC: 33.7 g/dL (ref 30.0–36.0)
MCV: 88.9 fL (ref 78.0–100.0)
Platelets: 150 10*3/uL (ref 150–400)
RBC: 4.31 MIL/uL (ref 4.22–5.81)
RDW: 13.5 % (ref 11.5–15.5)
WBC: 7.4 10*3/uL (ref 4.0–10.5)

## 2017-02-27 LAB — BASIC METABOLIC PANEL
Anion gap: 8 (ref 5–15)
BUN: 16 mg/dL (ref 6–20)
CALCIUM: 9 mg/dL (ref 8.9–10.3)
CHLORIDE: 102 mmol/L (ref 101–111)
CO2: 26 mmol/L (ref 22–32)
CREATININE: 0.82 mg/dL (ref 0.61–1.24)
GFR calc Af Amer: >60 mL/min (ref >60)
GFR calc non Af Amer: >60 mL/min (ref >60)
Glucose, Bld: 119 mg/dL — ABNORMAL HIGH (ref 65–99)
Potassium: 4.3 mmol/L (ref 3.5–5.1)
SODIUM: 136 mmol/L (ref 135–145)

## 2017-02-27 LAB — GLUCOSE, CAPILLARY
Glucose-Capillary: 89 mg/dL (ref 65–99)
Glucose-Capillary: 112 mg/dL — ABNORMAL HIGH (ref 65–99)
Glucose-Capillary: 123 mg/dL — ABNORMAL HIGH (ref 65–99)
Glucose-Capillary: 92 mg/dL (ref 65–99)

## 2017-02-27 MED ORDER — POLYETHYLENE GLYCOL 3350 17 G PO PACK: 17.0000 g | PACK | Freq: Every day | ORAL | 0 refills | Status: DC

## 2017-02-27 MED ORDER — PREDNISONE 20 MG PO TABS: 20.0000 mg | ORAL_TABLET | Freq: Every day | ORAL | 0 refills | Status: DC

## 2017-02-27 MED ORDER — PREDNISONE 20 MG PO TABS
40.0000 mg | ORAL_TABLET | Freq: Every day | ORAL | Status: DC
  Administered 2017-02-27: 40 mg via ORAL
  Filled 2017-02-27: qty 2

## 2017-02-27 NOTE — Progress Notes (Signed)
Attempted to see pt today.  Pt and wife a bit "down" about surgery on foot being delayed.  Pt states he just wants to keep foot elevated for today and MD told pt to focus on decreasing swelling. Talked to pt about doing for himself in room: bathe self, groom etc when able. Will check back tomorrow.  Jinger Neighbors, OTR/L

## 2017-02-27 NOTE — Clinical Social Work Placement (Signed)
   CLINICAL SOCIAL WORK PLACEMENT  NOTE  Date:  02/27/2017  Patient Details  Name: Troy Banks MRN: [id number] Date of Birth: Aug 26, 1940  Clinical Social Work is seeking post-discharge placement for this patient at the [place] level of care (*CSW will initial, date and re-position this form in  chart as items are completed):  Yes   Patient/family provided with [place] Work Department's list of facilities offering this level of care within the geographic area requested by the patient (or if unable, by the patient's family).  Yes   Patient/family informed of their freedom to choose among providers that offer the needed level of care, that participate in Medicare, Medicaid or managed care program needed by the patient, have an available bed and are willing to accept the patient.  Yes   Patient/family informed of [facility]'s ownership interest in [facility] and [facility], as well as of the fact that they are under no obligation to receive care at these facilities.  PASRR submitted to EDS on       PASRR number received on 02/27/17     Existing PASRR number confirmed on       FL2 transmitted to all facilities in geographic area requested by pt/family on 02/27/17     FL2 transmitted to all facilities within larger geographic area on       Patient informed that his/her managed care company has contracts with or will negotiate with certain facilities, including the following:        Yes   Patient/family informed of bed offers received.  Patient chooses bed at [facility] recommends and patient chooses bed at      Patient to be transferred to [facility] on 02/27/17.  Patient to be transferred to facility by [facility]     Patient family notified on 02/27/17 of transfer.  Name of family member notified:  significant other at bedside     PHYSICIAN Please prepare prescriptions      Additional Comment:    _______________________________________________ Normajean Baxter, LCSW 02/27/2017, 3:55 PM

## 2017-02-27 NOTE — Social Work (Signed)
Clinical Social Worker facilitated patient discharge including contacting patient family and facility to confirm patient discharge plans.  Clinical information faxed to facility and family agreeable with plan.    CSW arranged ambulance transport via [facility] to [facility].    RN to call [phone] to give report prior to discharge.  Clinical Social Worker will sign off for now as social work intervention is no longer needed. Please consult [place] again if new need arises.  Elissa Hefty, LCSW Clinical Social Worker [phone]

## 2017-02-27 NOTE — Discharge Summary (Signed)
Physician Discharge Summary  Troy CRISCO CHE:[id number] DOB: Feb 19, 1941 DOA: 02/22/2017  PCP: [name], MD  Admit date: 02/22/2017 Discharge date: 02/27/2017  Admitted From: Home  Disposition:  Skilled nursing facility  Recommendations for Outpatient Follow-up:  1. Follow up with Dr. [name] on October 2nd 2. Elevated left leg when at reclining 3. Non weight bearing left lower extremity  Home Health:  PT/OT  Equipment/Devices:  External fixation left leg  Discharge Condition:  Stable, improved CODE STATUS:  Full code  Diet recommendation:   Diabetic healthy heart  Brief/Interim Summary:  Troy Krontz Huckabeeis a 76 y.o.malewith medical history significant of CAD s/p CABG, AS s/p AVR, diastolic CHF with preserved EF, HTN. Patient presented to the ED after falling off of a roof on the day of admission. Had been feeling fine just before the fall. Landed on L ankle. Severe L ankle pain and deformity after fall.  Underwent external fixation on 9/19 by Dr. Percell Miller.  He also reported some back pain and was found to have an acute L2 compression fracture.  He was put in a back brace.  PT/OT now recommending SNF.  Patient had too much swelling to undergo fusion and will be discharged to SNF with follow up in clinic in 1 week.    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  Also had AVR -  EF 55-60% -  Stress test 4 month ago low risk  Chronic diastolic heart failure -  Continue lasix  Hypertension, blood pressure mildly elevated -  Continue norvasc  Hypothyroidism, stable, continue synthroid  OSA, stable, continue cpap  Diabetes mellitus type 2, A1c 5.9 10/26/2016 -  CBG well controlled without insulin  Possible gout flare in the right knee -  Prednisone taper started on 9/24  Discharge Instructions  Discharge Instructions    (HEART FAILURE PATIENTS) Call MD:  Anytime you have any of the following symptoms: 1) 3 pound weight gain in 24 hours or 5 pounds in 1 week 2) shortness of breath, with or without a dry hacking cough 3) swelling in the hands, feet or stomach 4) if you have to sleep on extra pillows at night in order to breathe.    Complete by:  As directed    Call MD for:  difficulty breathing, headache or visual disturbances    Complete by:  As directed    Call MD for:  extreme fatigue    Complete by:  As directed    Call MD for:  hives    Complete by:  As directed    Call MD for:  persistant dizziness or light-headedness    Complete by:  As directed    Call MD for:  persistant  nausea and vomiting    Complete by:  As directed    Call MD for:  redness, tenderness, or signs of infection (pain, swelling, redness, odor or green/yellow discharge around incision site)    Complete by:  As directed    Call MD for:  severe uncontrolled pain    Complete by:  As directed    Call MD for:  temperature >100.4    Complete by:  As directed    Diet Carb Modified    Complete by:  As directed    Increase activity slowly    Complete by:  As directed        Medication List    TAKE these medications   allopurinol 300 MG tablet Commonly known as:  ZYLOPRIM Take 1 tablet (300 mg total) by  mouth daily.   amLODipine 10 MG tablet Commonly known as:  NORVASC TAKE 1 TABLET BY MOUTH EVERY DAY What changed:  See the new instructions.   aspirin EC 81 MG tablet Take 81 mg by mouth daily.   blood glucose meter kit and supplies Kit Use monitor to check blood sugar twice a day. DX E11.09   cholecalciferol 1000 units tablet Commonly known as:  VITAMIN D Take 1,000 Units by mouth 2 (two) times daily.   furosemide 40 MG tablet Commonly known as:  LASIX TAKE 1 TABLET BY MOUTH EVERY OTHER DAY What changed:  See the new instructions.   glucose blood test strip Commonly known as:  ONE TOUCH TEST STRIPS Test up to two times daily   ibuprofen 200 MG tablet Commonly known as:  ADVIL,MOTRIN Take 400-800 mg by mouth every 6 (six) hours as needed (pain).   ketorolac 10 MG tablet Commonly known as:  TORADOL Take 1 tablet (10 mg total) by mouth every 6 (six) hours as needed.   levothyroxine 25 MCG tablet Commonly known as:  SYNTHROID, LEVOTHROID TAKE 1 TABLET BY MOUTH EVERY DAY BEFORE BREAKFAST What changed:  See the new instructions.   LUBRICANT EYE DROPS OP Place 1 drop into both eyes at bedtime.   ONE TOUCH LANCETS Misc Test up to two times daily   ONE TOUCH ULTRA 2 w/Device Kit Test up to two times daily   polyethylene glycol packet Commonly known as:  MIRALAX / GLYCOLAX Take 17 g by mouth daily.   predniSONE 20 MG tablet Commonly known as:  DELTASONE Take 1 tablet (20 mg total) by mouth daily with breakfast.   primidone 50 MG tablet Commonly known as:  MYSOLINE 150 mg in the morning, 100 mg in the evening   ranitidine 150 MG tablet Commonly known as:  ZANTAC TAKE 1 TABLET BY MOUTH 2 TIMES DAILY   rosuvastatin 10 MG tablet Commonly known as:  CRESTOR Take 0.5 tablets (5 mg total) by mouth daily.   tiZANidine 4 MG tablet Commonly known as:  ZANAFLEX Take 1 tablet by mouth every 6 hours as needed for muscle spasms.   Turmeric 500 MG Caps Take 500 mg by  mouth 2 (two) times daily.   vitamin B-12 1000 MCG tablet Commonly known as:  CYANOCOBALAMIN Take 1,000 mcg by mouth 2 (two) times daily.   vitamin C 1000 MG tablet Take 1,000 mg by mouth 2 (two) times daily.      Follow-up Information    [name], MD Follow up.   Specialties:  Internal Medicine, Radiology Contact information: [address] [address] [phone]  Haddix, Thomasene Lot, MD. Schedule an appointment as soon as possible for a visit in 1 week(s).   Specialty:  Orthopedic Surgery Contact information: [address] [place] [address] [phone]          Allergies  Allergen Reactions  . Benzodiazepines Other (See Comments)    Patient states he gets stroke symptoms with benzo's.   . Coreg [Carvedilol] Other (See Comments)    Accumulates and causes stroke-like side-effects due to 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 Patient poor metabolizer of CYP2D6 - Coreg undergoes extensive hepatic (including 2D6)  . Lisinopril Other (See Comments)    Hyperkalemia. 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  . Mobic [Meloxicam] Other (See Comments)    Accumulates and causes stroke-like side-effects due to 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  . Sertraline Hcl Other (See Comments)    Accumulates and causes stroke-like side-effects due to 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  . Tramadol Other (See Comments)    Accumulates and causes stroke-like side-effects due to 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  . Atorvastatin Other (See Comments)    MYALGIAS  . Colchicine Other (See Comments)    ataxia  . Compazine [Prochlorperazine Maleate] Other (See Comments)    Accumulates and causes stroke-like side-effects due to 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  . Flomax [Tamsulosin Hcl]     Accumulates and causes stroke-like side-effects due to 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  . Ondansetron Other (See  Comments)    Accumulates and causes stroke-like side-effects due to 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   . Promethazine Other (See Comments)    Accumulates and causes stroke-like side-effects due to 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  . Acyclovir And Related Other (See Comments)    Accumulates and causes stroke-like side-effects due to 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    Consultations: Trauma surgery, Dr. [name] Orthopedic surgery, Dr. Percell Miller   Procedures/Studies: Dg Chest 1 View  Result Date: 02/22/2017 CLINICAL DATA:  Fall off roof, preop EXAM: CHEST 1 VIEW COMPARISON:  10/27/2016 FINDINGS: Lungs are clear.  No pleural effusion or pneumothorax. The heart is normal in size. Prosthetic valve. Postsurgical changes related to prior CABG. Median sternotomy. IMPRESSION: No evidence of acute cardiopulmonary disease. Electronically Signed   By: Julian Hy M.D.   On: 02/22/2017 20:18   Dg Lumbar Spine Complete  Result Date: 02/22/2017 CLINICAL DATA:  Fall off roof EXAM: LUMBAR SPINE - COMPLETE 4+ VIEW COMPARISON:  CT abdomen/ pelvis dated 10/28/2016 FINDINGS: Mild superior endplate compression fracture deformity at L2, with approximately 20% loss of height. No retropulsion or involvement of the posterior elements. Mild degenerative changes. Visualized bony pelvis appears intact. Vascular calcifications. IMPRESSION: Mild superior endplate compression fracture deformity at L2 with approximately 20% loss of height. No retropulsion. Electronically Signed   By: Julian Hy M.D.   On: 02/22/2017 20:18   Dg Pelvis 1-2 Views  Result Date: 02/22/2017 CLINICAL DATA:  [place] off a 15 foot roof today.  Low back pain. EXAM: PELVIS - 1-2 VIEW COMPARISON:  10/18/2016 FINDINGS: Again noted is a right hip replacement which appears to be grossly intact. The pelvic bony ring is intact. Evidence for facet arthropathy in the lower lumbar spine. Right femoral stem is not completely visualized. No gross  abnormality to the left hip. IMPRESSION: No acute abnormality to the pelvis. Electronically Signed   By: Markus Daft M.D.   On: 02/22/2017 20:17   Dg Ankle 2 Views Left  Result Date: 02/22/2017 CLINICAL DATA:  External Fixation Left  Ankle. Fluoro time: 42 sec EXAM: LEFT ANKLE - 2 VIEW; DG C-ARM 61-120 MIN COMPARISON:  Plain film of the left ankle from earlier same day. FINDINGS: Seven intraoperative fluoroscopic images are provided. Final images show significantly improved osseous alignment at the left ankle with symmetric ankle mortise. Fluoroscopy was provided for 42 seconds. IMPRESSION: Intraoperative fluoroscopic images showing significant interval improvement of the osseous alignment at the left ankle. Electronically Signed   By: Franki Cabot M.D.   On: 02/22/2017 22:44   Ct Lumbar Spine Wo Contrast  Result Date: 02/23/2017 CLINICAL DATA:  Fall from ladder EXAM: CT LUMBAR SPINE WITHOUT CONTRAST TECHNIQUE: Multidetector CT imaging of the lumbar spine was performed without intravenous contrast administration. Multiplanar CT image reconstructions were also generated. COMPARISON:  Lumbar spine radiograph 02/22/2017 FINDINGS: Segmentation: 5 lumbar type vertebrae. Alignment: Grade 1 L4-L5 anterolisthesis secondary to advanced facet hypertrophy. Vertebrae: There is a wedge compression fracture of L2 involving the superior endplate with approximately 25% anterior height loss. No retropulsion. No other vertebral fracture. Paraspinal and other soft tissues: There is atherosclerotic calcification of the aorta and iliac arteries. Calcification in the right adrenal gland. Nonobstructing bilateral renal calculi. Disc levels: T12-L1: Normal disc space and facets. No spinal canal or neuroforaminal stenosis. L1-L2: Small central disc protrusion.  No stenosis. L2-L3: Small disc bulge without stenosis. L3-L4: Severe bilateral facet hypertrophy with mild bilateral neural foraminal stenosis. L4-L5: Severe bilateral facet  hypertrophy with grade 1 anterolisthesis and a least moderate bilateral foraminal stenosis. L5-S1: Severe facet hypertrophy and small disc bulge. Moderate right and severe left neural foraminal stenosis. Visualized sacrum: Normal. IMPRESSION: 1. Acute wedge compression fracture of L2 involving the superior endplate with no retropulsion. 2. Severe bilateral facet arthrosis at L3-S1 resulting in multilevel moderate to severe foraminal stenosis. 3.  Aortic Atherosclerosis (ICD10-I70.0). 4. Bilateral nonobstructive nephrolithiasis. Electronically Signed   By: Ulyses Jarred M.D.   On: 02/23/2017 02:12   Ct Ankle Left Wo Contrast  Result Date: 02/23/2017 CLINICAL DATA:  Ankle fracture after falling from a ladder. Status post external fixation. EXAM: CT OF THE LEFT ANKLE WITHOUT CONTRAST TECHNIQUE: Multidetector CT imaging of the left ankle was performed according to the standard protocol. Multiplanar CT image reconstructions were also generated. COMPARISON:  Ankle x-rays dated February 22, 2017. FINDINGS: Bones/Joint/Cartilage There is a highly comminuted fracture of the distal tibia, involving the tibial plafond, metadiaphysis, and medial malleolus. There is up to 10 mm of articular surface depression along the central medial aspect of the tibial plafond. There is a 13 mm tibial plafond fragment that is flipped superiorly. The medial malleolus fracture is distracted approximately 7 mm. There is a mildly displaced avulsion fracture of the lateral malleolus. There is a comminuted, displaced fracture of the posterior lateral talar dome. There is a comminuted fracture of the lateral talar process. There is widening of the tibiotalar joint with multiple bony fragments in the joint space. Two external fixation screws are noted through the calcaneus. Mild degenerative changes of the midfoot. Os peroneum. Small plantar and Achilles enthesophytes. Ligaments Suboptimally assessed by CT. Muscles and Tendons The posterior  tibialis and flexor digitorum longus tendons are surrounded by bony fragments (series 4, image 25). Portions of the peroneal tendons herniate into the lateral malleolus avulsion fracture. Dystrophic calcifications within the distal Achilles tendon, consistent with prior injury/tendinosis. Soft tissues Diffuse soft tissue swelling about the ankle. IMPRESSION: 1. Highly comminuted fracture of the distal tibia involving the metadiaphysis, medial malleolus, and tibial plafond, with up to  10 mm of articular surface depression, as described above. 2. Avulsion fracture of the lateral malleolus. 3. Comminuted fractures of the posterolateral talar dome and lateral talar process. 4. Potential entrapment of the posterior tibialis, flexor digitorum longus, and peroneal tendons. Electronically Signed   By: Titus Dubin M.D.   On: 02/23/2017 08:40   Dg Ankle Left Port  Result Date: 02/22/2017 CLINICAL DATA:  Pain following fall EXAM: PORTABLE LEFT ANKLE - 2 VIEW COMPARISON:  None. FINDINGS: Frontal and lateral views were obtained it. There is a comminuted fracture of the distal tibia with displaced fracture fragments distally. Fracture extends to the tibial plafond with gross ankle mortise disruption. There is avulsion of the medial malleolus. Fibula does not appear fractured. There is associated soft tissue swelling. There are small calcaneal spurs. IMPRESSION: Comminuted fracture of the distal tibia extending from the diaphysis seal region through the metaphysis and into the plafond. There is avulsion of the medial malleolus from the remainder of the tibia. There is gross ankle mortise disruption. Fibula appears intact. Electronically Signed   By: Lowella Grip III M.D.   On: 02/22/2017 18:08   Dg Foot 2 Views Left  Result Date: 02/22/2017 CLINICAL DATA:  Fall from ladder, left ankle comminuted fracture dislocation EXAM: LEFT FOOT - 2 VIEW COMPARISON:  02/22/2017 FINDINGS: Acute comminuted left ankle fracture  dislocation noted. Soft tissue swelling. Bones are osteopenic. Chronic fusion of the left first MTP joint. No other acute fracture involving the foot. IMPRESSION: Acute comminuted left ankle fracture dislocation. Chronic left first MTP joint ankylosis. Electronically Signed   By: Jerilynn Mages.  Shick M.D.   On: 02/22/2017 18:09   Dg C-arm 1-60 Min  Result Date: 02/22/2017 CLINICAL DATA:  External Fixation Left Ankle. Fluoro time: 42 sec EXAM: LEFT ANKLE - 2 VIEW; DG C-ARM 61-120 MIN COMPARISON:  Plain film of the left ankle from earlier same day. FINDINGS: Seven intraoperative fluoroscopic images are provided. Final images show significantly improved osseous alignment at the left ankle with symmetric ankle mortise. Fluoroscopy was provided for 42 seconds. IMPRESSION: Intraoperative fluoroscopic images showing significant interval improvement of the osseous alignment at the left ankle. Electronically Signed   By: Franki Cabot M.D.   On: 02/22/2017 22:44    Subjective: Having increased pains in the right knee today and ongoing muscle spasms in the left leg.  Denies SOB, CP, nausea.    Discharge Exam: Vitals:   02/26/17 2117 02/27/17 0700  BP: 137/64 (!) 177/68  Pulse: (!) 55 61  Resp: 16 16  Temp: 99.3 F (37.4 C) 98.4 F (36.9 C)  SpO2: 91% 98%   Vitals:   02/26/17 0552 02/26/17 1519 02/26/17 2117 02/27/17 0700  BP: (!) 163/69 (!) 146/78 137/64 (!) 177/68  Pulse: 62 71 (!) 55 61  Resp: _0 Temp: 97.8 F (36.6 C) 99.3 F (37.4 C) 99.3 F (37.4 C) 98.4 F (36.9 C)  TempSrc: Oral Oral Oral Oral  SpO2: 97% 94% 91% 98%  Weight:      Height:        General exam:  Adult male.  No acute distress.  HEENT:  NCAT, MMM Respiratory system: Clear to auscultation bilaterally Cardiovascular system: Regular rate and rhythm, 3/6.  Warm extremities Gastrointestinal system: Normal active bowel sounds, soft, nondistended, nontender. MSK:  Normal tone and bulk.  Left lower extremity with 1+ pitting  edema, bruising, external fixation in place.  < 2 sec CR, SITLT, able to wiggle toes.  Right knee  without effusion, mild pain with palpation along the right lateral knee which patient reports is consistent with gout flare.  Not warm to touch or erythematous Neuro:  Grossly intact   The results of significant diagnostics from this hospitalization (including imaging, microbiology, ancillary and laboratory) are listed below for reference.     Microbiology: Recent Results (from the past 240 hour(s))  Surgical pcr screen     Status: Abnormal   Collection Time: 02/22/17  8:49 PM  Result Value Ref Range Status   MRSA, PCR NEGATIVE NEGATIVE Final   Staphylococcus aureus POSITIVE (A) NEGATIVE Final    Comment: (NOTE) The Xpert SA Assay (FDA approved for NASAL specimens in patients 72 years of age and older), is one component of a comprehensive surveillance program. It is not intended to diagnose infection nor to guide or monitor treatment.      Labs: BNP (last 3 results) No results for input(s): BNP in the last 8760 hours. Basic Metabolic Panel:  Recent Labs Lab 02/22/17 1836 02/27/17 1134  NA 141 136  K 3.6 4.3  CL 108 102  CO2 24 26  GLUCOSE 117* 119*  BUN 14 16  CREATININE 0.91 0.82  CALCIUM 9.0 9.0   Liver Function Tests: No results for input(s): AST, ALT, ALKPHOS, BILITOT, PROT, ALBUMIN in the last 168 hours. No results for input(s): LIPASE, AMYLASE in the last 168 hours. No results for input(s): AMMONIA in the last 168 hours. CBC:  Recent Labs Lab 02/22/17 1836 02/27/17 1134  WBC 14.1* 7.4  NEUTROABS 11.4*  --   HGB 14.6 12.9*  HCT 43.1 38.3*  MCV 88.9 88.9  PLT 149* 150   Cardiac Enzymes: No results for input(s): CKTOTAL, CKMB, CKMBINDEX, TROPONINI in the last 168 hours. BNP: Invalid input(s): POCBNP CBG:  Recent Labs Lab 02/26/17 0527 02/26/17 1126 02/26/17 1616 02/26/17 2119 02/27/17 0659  GLUCAP 123* 128* 104* 124* 92   D-Dimer No results for  input(s): DDIMER in the last 72 hours. Hgb A1c No results for input(s): HGBA1C in the last 72 hours. Lipid Profile No results for input(s): CHOL, HDL, LDLCALC, TRIG, CHOLHDL, LDLDIRECT in the last 72 hours. Thyroid function studies No results for input(s): TSH, T4TOTAL, T3FREE, THYROIDAB in the last 72 hours.  Invalid input(s): FREET3 Anemia work up No results for input(s): VITAMINB12, FOLATE, FERRITIN, TIBC, IRON, RETICCTPCT in the last 72 hours. Urinalysis    Component Value Date/Time   COLORURINE YELLOW 10/26/2016 0935   APPEARANCEUR CLEAR 10/26/2016 0935   LABSPEC <=1.005 (A) 10/26/2016 0935   PHURINE 6.0 10/26/2016 0935   GLUCOSEU NEGATIVE 10/26/2016 0935   HGBUR LARGE (A) 10/26/2016 0935   BILIRUBINUR NEGATIVE 10/26/2016 0935   KETONESUR NEGATIVE 10/26/2016 0935   PROTEINUR NEGATIVE 03/28/2015 1918   UROBILINOGEN 0.2 10/26/2016 0935   NITRITE NEGATIVE 10/26/2016 0935   LEUKOCYTESUR NEGATIVE 10/26/2016 0935   Sepsis Labs Invalid input(s): PROCALCITONIN,  WBC,  LACTICIDVEN   Time coordinating discharge: Over 30 minutes  SIGNED:   Janece Canterbury, MD  [facility] 02/27/2017, 12:43 PM Pager   If 7PM-7AM, please contact night-coverage [email or web address] Password [email or web address]

## 2017-02-27 NOTE — Social Work (Signed)
Pt wants [facility] or [facility]. CSW called [facility] and they advised that they will not have a male bed today.  CSW called [facility] and they are getting back to CSW on availability.  Elissa Hefty, LCSW Clinical Social Worker [phone]

## 2017-02-27 NOTE — Care Management Important Message (Signed)
Important Message  Patient Details  Name: Troy Banks MRN: [id number] Date of Birth: 13-Oct-1940   Medicare Important Message Given:  Yes    Orbie Pyo 02/27/2017, 3:12 PM

## 2017-02-27 NOTE — Progress Notes (Signed)
Orthopaedic Trauma Progress Note  S: having some pain and muscle spasms this AM  O:  Vitals:   02/26/17 2117 02/27/17 0700  BP: 137/64 (!) 177/68  Pulse: (!) 55 61  Resp: 16 16  Temp: 99.3 F (37.4 C) 98.4 F (36.9 C)  SpO2: 91% 98%  LLE: Some drainage from pin sites about calcaneus. ACE wrap taken down and has significant swelling anterior/lateral/posterolateral. Sensation intact, warm and well perfused foot  A/P: 76 yo male with significant pilon injury to LLE  -He is too swollen for surgery this week -Recommend discharge and return to my clinic on October 2nd for recheck of skin -Elevation, ICE and compression to assist with swelling. -Mr. Poster is understanding of this  Shona Needles, MD [facility] [phone] (phone)

## 2017-02-27 NOTE — Social Work (Signed)
CSW f/u on bed offers.   CSW advsied patient of status of SNF's. CSW f/u up on additonal beds. CSW reiterated to patient that he is discharged today.  Elissa Hefty, LCSW Clinical Social Worker [phone]

## 2017-02-27 NOTE — [place] FL2 (Signed)
[place] MEDICAID FL2 LEVEL OF CARE SCREENING TOOL     IDENTIFICATION  Patient Name: Troy Banks Birthdate: 02-28-1941 Sex: male Admission Date (Current Location): 02/22/2017  [facility] and [place] Number:  Herbalist and Address:  [facility] [place] [address] [place], [place] [address]      Provider Number: [id number]  Attending Physician Name and Address:  Janece Canterbury, MD  Relative Name and Phone Number:  Karmen Bongo, significant other, [phone]    Current Level of Care: Hospital Recommended Level of Care: [place] Prior Approval Number:    Date Approved/Denied: 02/27/17 PASRR Number: [id number]  Discharge Plan: SNF    Current Diagnoses: Patient Active Problem List   Diagnosis Date Noted  . Fracture dislocation of left ankle joint, closed, initial encounter 02/22/2017  . Renal stone 10/26/2016  . Gross hematuria 10/26/2016  . Bladder neck obstruction 10/26/2016  . External hemorrhoid, thrombosed 06/10/2016  . Internal hemorrhoids 06/10/2016  . Lumbar back pain 05/19/2016  . Osteoarthritis of right hip 10/27/2015  . Status post total replacement of right hip 10/27/2015  . Chronic chest pain 09/22/2015  . Acute gouty arthritis 06/11/2015  . CAD (coronary artery disease), native coronary artery 06/11/2015  . Chronic diastolic CHF (congestive heart failure) ([place]) 06/11/2015  . H/O tissue AVR Oct 2016 06/10/2015  . PAF post CABG/AVR- Amiodarone 06/10/2015  . Dyslipidemia 06/10/2015  . S/P CABG x 4-Oct 2016 03/30/2015  . H/O NSTEMI-Oct 2016 03/27/2015  . 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 Depression 09/08/2014  . GERD (gastroesophageal reflux disease)   . Essential tremor 03/31/2014  . Iliotibial band syndrome affecting left lower leg 01/01/2014  . Spondylolisthesis at L4-L5 level 01/01/2014  . Sinus bradycardia 12/31/2013  . Greater trochanteric bursitis of  right hip 12/04/2013  . OSA on CPAP   . Diet-controlled diabetes mellitus ([place]) 07/08/2009  . COLONIC POLYPS, HX OF 12/31/2008  . GOUT 12/22/2008  . Essential hypertension 12/22/2008  . Diverticulosis of colon 12/22/2008  . NEPHROLITHIASIS, HX OF 12/22/2008    Orientation RESPIRATION BLADDER Height & Weight     Self, Time, Situation, Place  Normal Continent Weight: 203 lb (92.1 kg) Height:  5\' 8"  (172.7 cm)  BEHAVIORAL SYMPTOMS/MOOD NEUROLOGICAL BOWEL NUTRITION STATUS      Continent Diet (See DC Summary)  AMBULATORY STATUS COMMUNICATION OF NEEDS Skin   Supervision Verbally Surgical wounds (Left Ankle Fracture and Compression)                       Personal Care Assistance Level of Assistance  Bathing, Dressing, Feeding Bathing Assistance: Maximum assistance Feeding assistance: Limited assistance Dressing Assistance: Maximum assistance     Functional Limitations Info             SPECIAL CARE FACTORS FREQUENCY  PT (By licensed PT), OT (By licensed OT)     PT Frequency: deferred until patient has surgery in a week (5x week), see notes on chart OT Frequency: 2x week            Contractures Contractures Info: Not present    Additional Factors Info  Code Status, Allergies Code Status Info: Full code Allergies Info: BENZODIAZEPINES, COREG CARVEDILOL, LISINOPRIL, MOBIC MELOXICAM, SERTRALINE HCL, TRAMADOL, ATORVASTATIN, COLCHICINE, COMPAZINE PROCHLORPERAZINE MALEATE, FLOMAX TAMSULOSIN HCL, ONDANSETRON, PROMETHAZINE, ACYCLOVIR AND RELATED            Current Medications (02/27/2017):  This is the current hospital  active medication list Current Facility-Administered Medications  Medication Dose Route Frequency Provider Last Rate Last Dose  . acetaminophen (TYLENOL) tablet 650 mg  650 mg Oral Q6H PRN Etta Quill, DO       Or  . acetaminophen (TYLENOL) suppository 650 mg  650 mg Rectal Q6H PRN Etta Quill, DO      . allopurinol (ZYLOPRIM) tablet 300 mg   300 mg Oral Daily Jennette Kettle M, DO   300 mg at 02/27/17 0859  . amLODipine (NORVASC) tablet 10 mg  10 mg Oral Daily Jennette Kettle M, DO   10 mg at 02/27/17 5643  . aspirin EC tablet 81 mg  81 mg Oral Daily Etta Quill, DO   81 mg at 02/27/17 3295  . bisacodyl (DULCOLAX) suppository 10 mg  10 mg Rectal Once Short, Mackenzie, MD      . Chlorhexidine Gluconate Cloth 2 % PADS 6 each  6 each Topical Daily Haddix, Thomasene Lot, MD   6 each at 02/27/17 1003  . docusate sodium (COLACE) capsule 100 mg  100 mg Oral BID Prudencio Burly III, [name]   100 mg at 02/27/17 1884  . enoxaparin (LOVENOX) injection 40 mg  40 mg Subcutaneous Daily Prudencio Burly III, [name]   40 mg at 02/27/17 1660  . famotidine (PEPCID) tablet 20 mg  20 mg Oral BID Jennette Kettle M, DO   20 mg at 02/27/17 6301  . furosemide (LASIX) tablet 40 mg  40 mg Oral Margorie John, MD   40 mg at 02/27/17 0903  . HYDROmorphone (DILAUDID) tablet 1 mg  1 mg Oral Q4H PRN Janece Canterbury, MD   1 mg at 02/27/17 1239  . levothyroxine (SYNTHROID, LEVOTHROID) tablet 25 mcg  25 mcg Oral QAC breakfast Etta Quill, DO   25 mcg at 02/27/17 0859  . mupirocin ointment (BACTROBAN) 2 % 1 application  1 application Nasal BID Haddix, Thomasene Lot, MD   1 application at 60/10/93 1003  . polyethylene glycol (MIRALAX / GLYCOLAX) packet 17 g  17 g Oral Daily Janece Canterbury, MD   17 g at 02/27/17 0902  . predniSONE (DELTASONE) tablet 40 mg  40 mg Oral Q breakfast Janece Canterbury, MD   40 mg at 02/27/17 1239  . primidone (MYSOLINE) tablet 100 mg  100 mg Oral QHS Jennette Kettle M, DO   100 mg at 02/26/17 2052  . primidone (MYSOLINE) tablet 150 mg  150 mg Oral Daily Jennette Kettle M, DO   150 mg at 02/27/17 1000  . rosuvastatin (CRESTOR) tablet 5 mg  5 mg Oral Daily Jennette Kettle M, DO   5 mg at 02/27/17 2355  . senna (SENOKOT) tablet 17.2 mg  2 tablet Oral Hettie Holstein, MD   17.2 mg at 02/26/17 2051  . tiZANidine (ZANAFLEX) tablet  4 mg  4 mg Oral Q6H PRN Janece Canterbury, MD   4 mg at 02/27/17 7322     Discharge Medications: Please see discharge summary for a list of discharge medications.  Relevant Imaging Results:  Relevant Lab Results:   Additional Information SS#:[id number] [place], LCSW

## 2017-02-27 NOTE — Clinical Social Work Note (Signed)
Clinical Social Work Assessment  Patient Details  Name: Troy Banks MRN: [id number] Date of Birth: 01/13/1941  Date of referral:  02/27/17               Reason for consult:  Facility Placement                Permission sought to share information with:  Chartered certified accountant granted to share information::  Yes, Verbal Permission Granted  Name::     Karmen Bongo  Agency::  [facility] or [facility]  Relationship::  significant other  Contact Information:     Housing/Transportation Living arrangements for the past 2 months:  [place] of Information:  Patient, Other (Comment Required) (significant other) Patient Interpreter Needed:  None Criminal Activity/Legal Involvement Pertinent to Current Situation/Hospitalization:  No - Comment as needed Significant Relationships:  Adult Children, Significant Other Lives with:  Significant Other Do you feel safe going back to the place where you live?  No Need for family participation in patient care:  No (Coment)  Care giving concerns:  Pt from home with significant other. She is unable to lift him with new impairment. Pt will need to go to short term rehab for 1 week, then return to hospital and her another surgery.  Pt not appropriate for home at this time.  Social Worker assessment / plan:  CSW met with patient and significant other at bedside to discuss clinical team's recommendation for SNF. CSW explained her role and SNF options and placement. CSW discussed medicare covering first 20 days completely and then patient will have co-pay and/or his supplemental insurance may be applicable. Pt agreed to f/u with SNF.  Pt interested in [facility] and/or [facility] as he has never been to SNF before and has heard positive things about SNF. CSW will f/u  FL2 completed. passr confirmed and offers sent.  Employment status:  Retired Nurse, adult PT Recommendations:  [employer] / Referral to community resources:  [place]  Patient/Family's Response to care:  Patient/family agreeable to SNF and appreciative of CSW assistance with placement. No issues or concerns identified.    Patient/Family's Understanding of and Emotional Response to Diagnosis, Current Treatment, and Prognosis:  Pt has good understanding of diagnosis, current treatment and prognosis and is hopeful he will improve and get back to normalcy after short term rehab and next procedure. CSW validated and supported his plans to return to baseline. No issues or concerns identified.  Emotional Assessment Appearance:  Appears stated age Attitude/Demeanor/Rapport:   (Cooperative) Affect (typically observed):  Accepting, Appropriate Orientation:  Oriented to Self, Oriented to Place, Oriented to  Time, Oriented to Situation Alcohol / Substance use:  Not Applicable Psych involvement (Current and /or in the community):  No (Comment)  Discharge Needs  Concerns to be addressed:  Care Coordination Readmission within the last 30 days:  No Current discharge risk:  Dependent with Mobility, Physical Impairment Barriers to Discharge:  No Barriers Identified   Normajean Baxter, LCSW 02/27/2017, 1:51 PM

## 2017-02-28 ENCOUNTER — Non-Acute Institutional Stay (SKILLED_NURSING_FACILITY): Payer: Medicare Other | Admitting: Internal Medicine

## 2017-02-28 ENCOUNTER — Telehealth: Payer: Self-pay | Admitting: *Deleted

## 2017-02-28 ENCOUNTER — Encounter: Payer: Self-pay | Admitting: Internal Medicine

## 2017-02-28 DIAGNOSIS — E8889 Other specified metabolic disorders: Secondary | ICD-10-CM

## 2017-02-28 DIAGNOSIS — M109 Gout, unspecified: Secondary | ICD-10-CM

## 2017-02-28 DIAGNOSIS — Z9189 Other specified personal risk factors, not elsewhere classified: Secondary | ICD-10-CM | POA: Diagnosis not present

## 2017-02-28 DIAGNOSIS — S82892G Other fracture of left lower leg, subsequent encounter for closed fracture with delayed healing: Secondary | ICD-10-CM | POA: Diagnosis not present

## 2017-02-28 SURGERY — OPEN REDUCTION INTERNAL FIXATION (ORIF) ANKLE FRACTURE
Anesthesia: Choice | Laterality: Left

## 2017-02-28 NOTE — Assessment & Plan Note (Signed)
Consult Pharmacy as to optimal pain control therapy in view of this therapeutic limitation and his comorbidities

## 2017-02-28 NOTE — Assessment & Plan Note (Signed)
Follow-up with Dr. [name] 03/07/17 ;definitive surgical intervention once edema has resolved Specific orthopedic restrictions will be clarified with Dr. Tama Headings office

## 2017-02-28 NOTE — Telephone Encounter (Signed)
Pt is on TCM list admitted 02/22/17 from falling off of a roof. Py fracture dislocation of left ankle joint, closed. Patient had too much swelling to undergo fusion. Pt was D/ C 02/27/17, and sent to SNF...[name]

## 2017-02-28 NOTE — Progress Notes (Signed)
NURSING HOME LOCATION:  [facility] ROOM NUMBER:  [id number]  CODE STATUS:  DNR  PCP:  [name][place]  [place] [address]   This is a comprehensive admission note to [facility] performed on this date less than 30 days from date of admission.   Included are preadmission medical/surgical history;reconciled medication list; family history; social history and comprehensive review of systems.  Corrections and additions to the records were documented . Comprehensive physical exam was also performed. Additionally a clinical summary was entered for each active diagnosis pertinent to this admission in the Problem List to enhance continuity of care.  HPI: The patient was hospitalized 9/19-9/24/18 for ankle fracture with external fixation 9/19  by Dr. Fredonia Highland for fracture sustained in a fall from roof. He was cleaning his gutters wearing slippers when he fell from a ladder. There was no cardiac or neurologic prodrome prior to the fall. He also reported back pain and was found to have an acute L2 compression fracture for which back brace was prescribed. PT/OT was [facility] at [facility] for [facility]. Fusion cannot be pursued untill swelling resolves. Acute gout flare in the right knee prompted initiation of prednisone 9/24 with taper. Obstructive sleep apnea was stable, he was to continue CPAP. Labs at discharge included glucose 119 and hemoglobin 12.9/hematocrit 38.3. Baseline hemoglobin and hematocrit been in the range of 14.1/42.6 pre op.  Past medical and surgical history: Is incredibly long and complicated. History includes sleep apnea, PTSD, cytochrome P450 variant, obstructive sleep apnea, myocardial infarction, nephrolithiasis, hypothyroidism, hypertension, dyslipidemia, gout, GERD, colon polyps, diverticulosis, and moderate aortic stenosis. Surgeries are extensive as well. He's had an open procedure for nephrolithiasis, cystoscopy with laser/stent, bypass  grafting, and aortic valve replacement.  Social history: 34-pack-year smoking history, quit in 1992. Alcohol socially.  Family history: Extensive history reviewed.  Review of systems: Essentially his only complaint is pain from the knee to the ankle associated with muscle spasms  typically with manipulation or  mobilization. He is specifically requesting Dilaudid. He had a renal calculus in June of this year which was calcium in nature as were prior stones. Uric acid was measured at that time, he does not know the value. He is compliant with his CPAP machine.  Constitutional: No fever,significant weight change, fatigue  Eyes: No redness, discharge, pain, vision change ENT/mouth: No nasal congestion,  purulent discharge, earache,change in hearing ,sore throat  Cardiovascular: No chest pain, palpitations,paroxysmal nocturnal dyspnea, claudication, edema  Respiratory: No cough, sputum production,hemoptysis, DOE  Gastrointestinal: No heartburn,dysphagia,abdominal pain, nausea / vomiting,rectal bleeding, melena,change in bowels Genitourinary: No dysuria,hematuria, pyuria,  incontinence, nocturia Dermatologic: No rash, pruritus, change in appearance of skin Neurologic: No dizziness,headache,syncope, seizures, numbness , tingling Psychiatric: No significant anxiety , depression, insomnia, anorexia Endocrine: No change in hair/skin/ nails, excessive thirst, excessive hunger, excessive urination  Hematologic/lymphatic: No significant bruising, lymphadenopathy,abnormal bleeding Allergy/immunology: No itchy/ watery eyes, significant sneezing, urticaria, angioedema  Physical exam:  Pertinent or positive findings: He has a full beard and mustache. He has slight ptosis, greater on the left. Has a grade 1. 5-2 systolic murmur loudest at the left base with radiation into the carotids. Minor rales are noted in homogenous pattern. The left lower extremity is external fixation device. He has onycholysis of  the toenails in isolated fashion. There is localized slight flexion contraction of the fifth right finger.  General appearance:Adequately nourished; no acute distress , increased work of breathing is present.  Lymphatic: No lymphadenopathy about the head, neck, axilla . Eyes: No conjunctival inflammation or lid edema is present. There is no scleral icterus. Ears:  External ear exam shows no significant lesions or deformities.   Nose:  External nasal examination shows no deformity or inflammation. Nasal mucosa are pink and moist without lesions ,exudates Oral exam: lips and gums are healthy appearing.There is no oropharyngeal erythema or exudate . Neck:  No thyromegaly, masses, tenderness noted.    Heart:  Normal rate and regular rhythm. S1 and S2 normal without gallop,click, rub .  Abdomen:Bowel sounds are normal. Abdomen is soft and nontender with no organomegaly, hernias,masses. GU: deferred  Extremities:  No cyanosis, clubbing,edema  Neurologic exam : Strength equal  in upper  extremities Balance,Rhomberg,finger to nose testing could not be completed due to clinical state Deep tendon reflexes are equal in UE Skin: Warm & dry w/o tenting. No significant lesions or rash.  See clinical summary under each active problem in the Problem List with associated updated therapeutic plan

## 2017-02-28 NOTE — Patient Instructions (Signed)
See assessment and plan under each diagnosis in the problem list and acutely for this visit 

## 2017-02-28 NOTE — Assessment & Plan Note (Addendum)
Clinically diagnosed acute gout in the right knee; on exam there is no significant effusion and no change in color or temperature of the knee Prednisone taper

## 2017-02-28 NOTE — Assessment & Plan Note (Signed)
[place] consultation A major concern is the potential for  high-dose nonsteroidals with both Toradol and ibuprofen ordered

## 2017-03-01 ENCOUNTER — Other Ambulatory Visit: Payer: Self-pay | Admitting: Internal Medicine

## 2017-03-13 ENCOUNTER — Other Ambulatory Visit: Payer: Self-pay | Admitting: *Deleted

## 2017-03-13 NOTE — Patient Outreach (Signed)
[facility]) Care Management Post-Acute Care Coordination 03/13/2017  Troy Banks [birth year] [id number]   Met with patient and wife at bedside, discussed [facility] care management program. Patient had a fall from a roof and injured his left leg, awaiting surgery.  Patient has history of CABG and AVR surgery. States his diabetes is controlled by diet.    Patient denies any post discharge needs at this time, he has a surgery planned for Wednesday, hopes to go home post op.   He states has support from  His wife.   He denies any issues with medication or transportation, states he hopes to regain his independence after his next surgery and therapy, either outpatient or home care.   Patient given [facility] brochure and RNCM contact for future consideration.  RNCM will sign off, but will remain available for additional [facility] program services consult as needs arise.  Royetta Crochet. Laymond Purser, RN, BSN, [place] Post-Acute Care Coordinator [phone]

## 2017-03-14 ENCOUNTER — Encounter (HOSPITAL_COMMUNITY): Payer: Self-pay | Admitting: *Deleted

## 2017-03-14 NOTE — Progress Notes (Signed)
Please complete anesthesia assessment with pt on DOS; pt is a current resident of [facility]. Pre-op instructions were faxed and reviewed by pt nurse, Nira Conn, [place].  Nurse denies that pt C.O SOB and chest pain. Nurse verbalized understanding of all pre-op instructions. Anesthesia asked to review pt history.

## 2017-03-14 NOTE — Pre-Procedure Instructions (Signed)
Troy Banks  03/14/2017      [facility], [place] - [place], [place] - [address] [place] [address] Phone: [phone] Fax: [phone]    Your procedure is scheduled on Friday, March 17, 2017  Report to [facility] at 5:30 A.M.  Call this number if you have problems the morning of surgery:  [phone]   Remember:  Do not eat food or drink liquids after midnight. Thursday, March 16, 2017  Take these medicines the morning of surgery with A SIP OF WATER: amLODipine (NORVASC), levothyroxine (SYNTHROID), Prednisone (DELTASONE), ranitidine (ZANTAC), primidone (MYSOLINE), Allopurinol  Stop taking Aspirin, vitamins, Turmeric, fish oil and herbal medications. Do not take any NSAIDs ie: Ibuprofen, Advil, Naproxen (Anaprox/Aleve), Motrin, ketorolac (TORADOL), BC and Goody Powder or any medication containing Aspirin; stop now.    How to Manage Your Diabetes Before and After Surgery  Why is it important to control my blood sugar before and after surgery? . Improving blood sugar levels before and after surgery helps healing and can limit problems. . A way of improving blood sugar control is eating a healthy diet by: o  Eating less sugar and carbohydrates o  Increasing activity/exercise o  Talking with your doctor about reaching your blood sugar goals . High blood sugars (greater than 180 mg/dL) can raise your risk of infections and slow your recovery, so you will need to focus on controlling your diabetes during the weeks before surgery. . Make sure that the doctor who takes care of your diabetes knows about your planned surgery including the date and location.  How do I manage my blood sugar before surgery? . Check your blood sugar at least 4 times a day, starting 2 days before surgery, to make sure that the level is not too high or low. o Check your blood sugar the morning of your surgery when you wake up and  every 2 hours until you get to the Short Stay unit. . If your blood sugar is less than 70 mg/dL, you will need to treat for low blood sugar: o Treat a low blood sugar (less than 70 mg/dL) with  cup of clear juice (cranberry or apple), 4 glucose tablets, OR glucose gel. o Recheck blood sugar in 15 minutes after treatment (to make sure it is greater than 70 mg/dL). If your blood sugar is not greater than 70 mg/dL on recheck, call  [phone] for further instructions. . Report your blood sugar to the short stay nurse when you get to Short Stay.  . If you are admitted to the hospital after surgery: o Your blood sugar will be checked by the staff and you will probably be given insulin after surgery (instead of oral diabetes medicines) to make sure you have good blood sugar levels. o The goal for blood sugar control after surgery is 80-180 mg/dL.  Reviewed and Endorsed by [facility] Patient Education Committee, August 2015  Do not wear jewelry, make-up or nail polish.  Do not wear lotions, powders, or perfumes, or deoderant.  Do not shave 48 hours prior to surgery.  Men may shave face and neck.  Do not bring valuables to the hospital.  [facility] is not responsible for any belongings or valuables.  Contacts, dentures or bridgework may not be worn into surgery.  Leave your suitcase in the car.  After surgery it may be brought to your room. For patients admitted to the hospital, discharge time will  be determined by your treatment team. Patients discharged the day of surgery will not be allowed to drive home.  Please read over the following fact sheets that you were given.

## 2017-03-16 NOTE — Anesthesia Preprocedure Evaluation (Addendum)
Anesthesia Evaluation  Patient identified by MRN, date of birth, ID band Patient awake    Reviewed: Allergy & Precautions, H&P , Patient's Chart, lab work & pertinent test results, reviewed documented beta blocker date and time   Airway Mallampati: II  TM Distance: >3 FB Neck ROM: full    Dental no notable dental hx.    Pulmonary sleep apnea , former smoker,    Pulmonary exam normal breath sounds clear to auscultation       Cardiovascular hypertension, + Past MI  + Valvular Problems/Murmurs AS  Rhythm:regular Rate:Normal     Neuro/Psych    GI/Hepatic   Endo/Other  diabetes  Renal/GU      Musculoskeletal   Abdominal   Peds  Hematology   Anesthesia Other Findings   Reproductive/Obstetrics                            Anesthesia Physical Anesthesia Plan  ASA: II  Anesthesia Plan: Spinal   Post-op Pain Management:    Induction:   PONV Risk Score and Plan: 2 and Ondansetron and Dexamethasone  Airway Management Planned:   Additional Equipment:   Intra-op Plan:   Post-operative Plan:   Informed Consent: I have reviewed the patients History and Physical, chart, labs and discussed the procedure including the risks, benefits and alternatives for the proposed anesthesia with the patient or authorized representative who has indicated his/her understanding and acceptance.   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 See my PAT note. Myra Gianotti, [name])      Anesthesia Quick Evaluation

## 2017-03-16 NOTE — Progress Notes (Signed)
Anesthesia Chart Review: SAME DAY WORK-UP.  Patient is a 76 year old male scheduled for fusion versus ORIF pilon fracture with bond grafting on 03/17/17 by Dr. Lennette Bihari Haddix.  History includes former smoker (quit '92), CAD/NSTEMI and moderate AS s/p CABG (LIMA-LAD, SVG-INTERMEDIATE, SVG-CX, SVG-dRCA) and AVR (23 mm [employer] pericardial tissue valve) 03/30/15, CHF, dysrhythmia (post-CABG afib, resolved), HTN, hypercholesterolemia, DM (diet controlled), carotid stenosis (mild; F/U PRN '17), hypothyroidism, OSA (CPAP), GERD, familial tremor, anxiety, PTSD, right THA 10/27/15, ureteral stent 11/07/16 (stent exchange 11/23/16), ACDF '02, posterior cervical fusion '06, tonsillectomy, left inguinal hernia repair '62. He fell off of a rood (while cleaning gutters) on 02/22/17 and sustained a left ankle fracture s/p external fixation 02/22/17 and acute L2 compression fracture (s/p brace; no surgical intervention per Neurosurgeon Dr. Kary Kos). There was too much swelling to undergo fusion, so he was discharged to SNF ([facility]) with out-patient orthopedic follow-up.  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 According to anesthesiologist Dr. Shanon Brow Joslin's 03/28/15 note, "From a cursory review of the literature, it appears this deficiency can occur in up to 6-10% of the caucasian population and results in reduced activity of the CYP2D^ enzyme.  This deficiency can result in the reduced metabolism of codeine , tramadol, SSRI inhibitors, beta blockers, ondanestron, and lidocaine among other meds. I have not found any contraindications in the literature to using benzodiazepines, fentanyl, volatile agents, propofol, muscle relaxants or other commonly used anesthetic agents in patients with this enzyme deficiency. Diazepam (Valium) is very lipid soluble and can  accumulate in the body if given for an extended period of time."  ; please see Dr. Verneda Skill note 03/28/15.    - Patient reports he requires increased anesthesia ("takes a lot to put me under") and also pt has PTSD, reports "will wake up fighting" if emerges too rapidly.   - PCP is [name]. He was seen by Dr. Unice [facility]) at [place] on 02/28/17. - Cardiologist is Dr. [name]. Next visit scheduled for 03/30/17.  - Neurologist is Dr. Wells Guiles [name].  Medications include allopurinol, amlodipine, ASA 81 mg, Lasix, ketorolac, levothyroxine, prednisone 20 mg daily (# 5 tabs; started 02/27/17), primidone, Zantac, Crestor, Zanaflex, turmeric.   EKG 02/22/17: SR, first degree AV block, borderline repolarization abnormality, prolonged QT. Previous tracings have intermittently shown prolonged QT.  Nuclear stress test 11/01/16 (ordered for evaluation of DOE):  The left ventricular ejection fraction is mildly decreased (45-54%).  Nuclear stress EF: 53%.  No T wave inversion was noted during stress.  There was no ST segment deviation noted during stress.  This is a low risk study.  No reversible ischemia. LVEF 53% with normal wall motion. This is a low risk study.  Echo 04/24/15:  - Left ventricle: The cavity size was normal. Wall thickness was increased in a pattern of mild LVH. Systolic function was normal. The estimated ejection fraction was in the range of 55% to 60%. Wall motion was normal; there were no regional wall motion abnormalities. Doppler parameters are consistent with abnormal left ventricular relaxation (grade 1 diastolic dysfunction). - Aortic valve: A bioprosthesis was present. There was trivial regurgitation. - Left atrium: The atrium was mildly dilated. - Atrial septum: There was an atrial septal aneurysm. - Pericardium, extracardiac: A small pericardial effusion was identified. - Impressions: Normal LV systolic function; grade 1 diastolic  dysfunction; mild LVH; s/p AVR with trace AI and mean gradient of 18 mmHg; mild LAE; trace MR.  Last cath was 03/26/15 prior to CABG.  Carotid duplex 04/19/16: Impression:  Heterogeneous plaque, bilaterally. 1-39% bilateral ICA stenosis. > 50% left ECA stenosis. Normal subclavian arteries, bilaterally. Patent vertebral arteries with antegrade flow. F/U PRN.   1V CXR 02/22/17: IMPRESSION: No evidence of acute cardiopulmonary disease.  He will need updated labs prior to surgery. If labs acceptable and otherwise no acute changes then I anticipate that he can proceed as planned. He does have prolonged QTc on preoperative EKG last month. Defer to anesthesiologist whether to repeat prior to surgery since has had intermittent prolonged QT on previous tracings. He is scheduled to see Dr. [name] later this month. Non-ischemic stress test earlier this year.   George Hugh [facility] Short Stay Center/Anesthesiology Phone [phone] 03/16/2017 12:47 PM

## 2017-03-17 ENCOUNTER — Inpatient Hospital Stay (HOSPITAL_COMMUNITY)
Admission: RE | Admit: 2017-03-17 | Discharge: 2017-03-20 | DRG: 493 | Disposition: A | Discharge status: Home / Self Care | Payer: Medicare Other | Source: Ambulatory Visit | Attending: Student | Admitting: Student

## 2017-03-17 ENCOUNTER — Inpatient Hospital Stay (HOSPITAL_COMMUNITY): Payer: Medicare Other | Admitting: Vascular Surgery

## 2017-03-17 ENCOUNTER — Inpatient Hospital Stay (HOSPITAL_COMMUNITY): Payer: Medicare Other

## 2017-03-17 ENCOUNTER — Encounter (HOSPITAL_COMMUNITY)
Admission: RE | Disposition: A | Discharge status: Home / Self Care | Payer: Self-pay | Source: Ambulatory Visit | Attending: Student

## 2017-03-17 ENCOUNTER — Encounter (HOSPITAL_COMMUNITY): Payer: Self-pay | Admitting: Certified Registered Nurse Anesthetist

## 2017-03-17 DIAGNOSIS — Z951 Presence of aortocoronary bypass graft: Secondary | ICD-10-CM | POA: Diagnosis not present

## 2017-03-17 DIAGNOSIS — I252 Old myocardial infarction: Secondary | ICD-10-CM | POA: Diagnosis not present

## 2017-03-17 DIAGNOSIS — G4733 Obstructive sleep apnea (adult) (pediatric): Secondary | ICD-10-CM

## 2017-03-17 DIAGNOSIS — I110 Hypertensive heart disease with heart failure: Secondary | ICD-10-CM | POA: Diagnosis present

## 2017-03-17 DIAGNOSIS — S82892G Other fracture of left lower leg, subsequent encounter for closed fracture with delayed healing: Secondary | ICD-10-CM

## 2017-03-17 DIAGNOSIS — I2510 Atherosclerotic heart disease of native coronary artery without angina pectoris: Secondary | ICD-10-CM | POA: Diagnosis present

## 2017-03-17 DIAGNOSIS — I10 Essential (primary) hypertension: Secondary | ICD-10-CM | POA: Diagnosis present

## 2017-03-17 DIAGNOSIS — W11XXXA Fall on and from ladder, initial encounter: Secondary | ICD-10-CM | POA: Diagnosis present

## 2017-03-17 DIAGNOSIS — Z833 Family history of diabetes mellitus: Secondary | ICD-10-CM | POA: Diagnosis not present

## 2017-03-17 DIAGNOSIS — S82872A Displaced pilon fracture of left tibia, initial encounter for closed fracture: Principal | ICD-10-CM | POA: Diagnosis present

## 2017-03-17 DIAGNOSIS — Z888 Allergy status to other drugs, medicaments and biological substances status: Secondary | ICD-10-CM

## 2017-03-17 DIAGNOSIS — I5032 Chronic diastolic (congestive) heart failure: Secondary | ICD-10-CM | POA: Diagnosis present

## 2017-03-17 DIAGNOSIS — Z9989 Dependence on other enabling machines and devices: Secondary | ICD-10-CM

## 2017-03-17 DIAGNOSIS — F329 Major depressive disorder, single episode, unspecified: Secondary | ICD-10-CM | POA: Diagnosis present

## 2017-03-17 DIAGNOSIS — E119 Type 2 diabetes mellitus without complications: Secondary | ICD-10-CM

## 2017-03-17 DIAGNOSIS — Z87891 Personal history of nicotine dependence: Secondary | ICD-10-CM | POA: Diagnosis not present

## 2017-03-17 DIAGNOSIS — I25119 Atherosclerotic heart disease of native coronary artery with unspecified angina pectoris: Secondary | ICD-10-CM | POA: Diagnosis present

## 2017-03-17 DIAGNOSIS — E8889 Other specified metabolic disorders: Secondary | ICD-10-CM | POA: Diagnosis present

## 2017-03-17 DIAGNOSIS — S82873A Displaced pilon fracture of unspecified tibia, initial encounter for closed fracture: Secondary | ICD-10-CM | POA: Insufficient documentation

## 2017-03-17 DIAGNOSIS — Y93H2 Activity, gardening and landscaping: Secondary | ICD-10-CM

## 2017-03-17 DIAGNOSIS — Z8249 Family history of ischemic heart disease and other diseases of the circulatory system: Secondary | ICD-10-CM | POA: Diagnosis not present

## 2017-03-17 DIAGNOSIS — Z952 Presence of prosthetic heart valve: Secondary | ICD-10-CM

## 2017-03-17 DIAGNOSIS — K219 Gastro-esophageal reflux disease without esophagitis: Secondary | ICD-10-CM | POA: Diagnosis present

## 2017-03-17 DIAGNOSIS — Z96641 Presence of right artificial hip joint: Secondary | ICD-10-CM | POA: Diagnosis present

## 2017-03-17 DIAGNOSIS — I6523 Occlusion and stenosis of bilateral carotid arteries: Secondary | ICD-10-CM | POA: Diagnosis present

## 2017-03-17 DIAGNOSIS — S82392A Other fracture of lower end of left tibia, initial encounter for closed fracture: Secondary | ICD-10-CM | POA: Diagnosis not present

## 2017-03-17 DIAGNOSIS — Z86010 Personal history of colonic polyps: Secondary | ICD-10-CM | POA: Diagnosis not present

## 2017-03-17 DIAGNOSIS — Z419 Encounter for procedure for purposes other than remedying health state, unspecified: Secondary | ICD-10-CM

## 2017-03-17 DIAGNOSIS — S82202A Unspecified fracture of shaft of left tibia, initial encounter for closed fracture: Secondary | ICD-10-CM | POA: Diagnosis not present

## 2017-03-17 DIAGNOSIS — F32A Depression, unspecified: Secondary | ICD-10-CM | POA: Diagnosis present

## 2017-03-17 HISTORY — DX: Displaced pilon fracture of left tibia, initial encounter for closed fracture: S82.872A

## 2017-03-17 HISTORY — DX: Type 2 diabetes mellitus without complications: E11.9

## 2017-03-17 HISTORY — PX: ANKLE FUSION: SHX881

## 2017-03-17 LAB — BASIC METABOLIC PANEL
ANION GAP: 8 (ref 5–15)
BUN: 19 mg/dL (ref 6–20)
CHLORIDE: 107 mmol/L (ref 101–111)
CO2: 24 mmol/L (ref 22–32)
Calcium: 9.2 mg/dL (ref 8.9–10.3)
Creatinine, Ser: 0.78 mg/dL (ref 0.61–1.24)
GFR calc Af Amer: >60 mL/min (ref >60)
GLUCOSE: 116 mg/dL — AB (ref 65–99)
POTASSIUM: 4 mmol/L (ref 3.5–5.1)
Sodium: 139 mmol/L (ref 135–145)

## 2017-03-17 LAB — CBC
HEMATOCRIT: 43.7 % (ref 39.0–52.0)
HEMOGLOBIN: 14.6 g/dL (ref 13.0–17.0)
MCH: 29.7 pg (ref 26.0–34.0)
MCHC: 33.4 g/dL (ref 30.0–36.0)
MCV: 89 fL (ref 78.0–100.0)
Platelets: 218 10*3/uL (ref 150–400)
RBC: 4.91 MIL/uL (ref 4.22–5.81)
RDW: 14.3 % (ref 11.5–15.5)
WBC: 11.9 10*3/uL — AB (ref 4.0–10.5)

## 2017-03-17 LAB — GLUCOSE, CAPILLARY: GLUCOSE-CAPILLARY: 149 mg/dL — AB (ref 65–99)

## 2017-03-17 SURGERY — ARTHRODESIS ANKLE
Anesthesia: Spinal | Site: Leg Lower | Laterality: Left

## 2017-03-17 MED ORDER — FENTANYL 2.5 MCG/ML BUPIVACAINE 1/10 % EPIDURAL INFUSION (WH - ANES)
12.0000 mL/h | INTRAMUSCULAR | Status: DC | PRN
  Administered 2017-03-17: 12 mL/h via EPIDURAL

## 2017-03-17 MED ORDER — CEFAZOLIN SODIUM-DEXTROSE 2-4 GM/100ML-% IV SOLN
INTRAVENOUS | Status: AC
  Filled 2017-03-17: qty 100

## 2017-03-17 MED ORDER — LIDOCAINE 2% (20 MG/ML) 5 ML SYRINGE
INTRAMUSCULAR | Status: AC
  Filled 2017-03-17: qty 5

## 2017-03-17 MED ORDER — MIDAZOLAM HCL 2 MG/2ML IJ SOLN
INTRAMUSCULAR | Status: AC
  Filled 2017-03-17: qty 2

## 2017-03-17 MED ORDER — SUCCINYLCHOLINE CHLORIDE 200 MG/10ML IV SOSY
PREFILLED_SYRINGE | INTRAVENOUS | Status: AC
  Filled 2017-03-17: qty 10

## 2017-03-17 MED ORDER — ACETAMINOPHEN 500 MG PO TABS
1000.0000 mg | ORAL_TABLET | Freq: Four times a day (QID) | ORAL | Status: AC
  Administered 2017-03-17 – 2017-03-18 (×4): 1000 mg via ORAL
  Filled 2017-03-17 (×4): qty 2

## 2017-03-17 MED ORDER — LEVOTHYROXINE SODIUM 25 MCG PO TABS
25.0000 ug | ORAL_TABLET | Freq: Every day | ORAL | Status: DC
  Administered 2017-03-18 – 2017-03-20 (×3): 25 ug via ORAL
  Filled 2017-03-17 (×3): qty 1

## 2017-03-17 MED ORDER — ALLOPURINOL 300 MG PO TABS
300.0000 mg | ORAL_TABLET | Freq: Every day | ORAL | Status: DC
  Administered 2017-03-17 – 2017-03-20 (×4): 300 mg via ORAL
  Filled 2017-03-17 (×4): qty 1

## 2017-03-17 MED ORDER — ACETAMINOPHEN 325 MG PO TABS
650.0000 mg | ORAL_TABLET | Freq: Four times a day (QID) | ORAL | Status: DC | PRN
  Administered 2017-03-18 – 2017-03-19 (×3): 650 mg via ORAL
  Filled 2017-03-17 (×3): qty 2

## 2017-03-17 MED ORDER — POTASSIUM CHLORIDE IN NACL 20-0.9 MEQ/L-% IV SOLN
INTRAVENOUS | Status: DC
  Administered 2017-03-18: 02:00:00 via INTRAVENOUS
  Filled 2017-03-17: qty 1000

## 2017-03-17 MED ORDER — ACETAMINOPHEN 650 MG RE SUPP: 650.0000 mg | Freq: Four times a day (QID) | RECTAL | Status: DC | PRN

## 2017-03-17 MED ORDER — EPHEDRINE SULFATE-NACL 50-0.9 MG/10ML-% IV SOSY
PREFILLED_SYRINGE | INTRAVENOUS | Status: DC | PRN
  Administered 2017-03-17 (×4): 10 mg via INTRAVENOUS

## 2017-03-17 MED ORDER — LACTATED RINGERS IV SOLN
INTRAVENOUS | Status: DC | PRN
  Administered 2017-03-17 (×2): via INTRAVENOUS

## 2017-03-17 MED ORDER — ASPIRIN 81 MG PO CHEW
81.0000 mg | CHEWABLE_TABLET | Freq: Every day | ORAL | Status: DC
  Administered 2017-03-18 – 2017-03-20 (×3): 81 mg via ORAL
  Filled 2017-03-17 (×3): qty 1

## 2017-03-17 MED ORDER — ENOXAPARIN SODIUM 40 MG/0.4ML ~~LOC~~ SOLN: 40.0000 mg | SUBCUTANEOUS | Status: DC

## 2017-03-17 MED ORDER — ROSUVASTATIN CALCIUM 5 MG PO TABS
5.0000 mg | ORAL_TABLET | Freq: Every day | ORAL | Status: DC
  Administered 2017-03-17 – 2017-03-19 (×3): 5 mg via ORAL
  Filled 2017-03-17 (×3): qty 1

## 2017-03-17 MED ORDER — PHENYLEPHRINE 40 MCG/ML (10ML) SYRINGE FOR IV PUSH (FOR BLOOD PRESSURE SUPPORT)
PREFILLED_SYRINGE | INTRAVENOUS | Status: AC
  Filled 2017-03-17: qty 10

## 2017-03-17 MED ORDER — ONDANSETRON HCL 4 MG/2ML IJ SOLN
INTRAMUSCULAR | Status: AC
  Filled 2017-03-17: qty 2

## 2017-03-17 MED ORDER — FENTANYL CITRATE (PF) 250 MCG/5ML IJ SOLN
INTRAMUSCULAR | Status: AC
  Filled 2017-03-17: qty 5

## 2017-03-17 MED ORDER — SODIUM CHLORIDE 0.9 % IJ SOLN
12.0000 mL/h | INTRAMUSCULAR | Status: DC
  Filled 2017-03-17 (×2): qty 3

## 2017-03-17 MED ORDER — PROPOFOL 10 MG/ML IV BOLUS
INTRAVENOUS | Status: AC
  Filled 2017-03-17: qty 40

## 2017-03-17 MED ORDER — ONDANSETRON HCL 4 MG/2ML IJ SOLN
INTRAMUSCULAR | Status: DC | PRN
  Administered 2017-03-17: 4 mg via INTRAVENOUS

## 2017-03-17 MED ORDER — FAMOTIDINE 20 MG PO TABS: 20.0000 mg | ORAL_TABLET | Freq: Two times a day (BID) | ORAL | Status: DC

## 2017-03-17 MED ORDER — CHLORHEXIDINE GLUCONATE 4 % EX LIQD: 60.0000 mL | Freq: Once | CUTANEOUS | Status: DC

## 2017-03-17 MED ORDER — VITAMIN C 500 MG PO TABS
1000.0000 mg | ORAL_TABLET | Freq: Two times a day (BID) | ORAL | Status: DC
  Administered 2017-03-17 – 2017-03-20 (×6): 1000 mg via ORAL
  Filled 2017-03-17 (×6): qty 2

## 2017-03-17 MED ORDER — PRIMIDONE 50 MG PO TABS
100.0000 mg | ORAL_TABLET | Freq: Two times a day (BID) | ORAL | Status: DC
  Administered 2017-03-17: 150 mg via ORAL
  Filled 2017-03-17 (×2): qty 3

## 2017-03-17 MED ORDER — TIZANIDINE HCL 4 MG PO TABS
4.0000 mg | ORAL_TABLET | Freq: Four times a day (QID) | ORAL | Status: DC | PRN
  Administered 2017-03-18 – 2017-03-20 (×7): 4 mg via ORAL
  Filled 2017-03-17 (×7): qty 1

## 2017-03-17 MED ORDER — PREDNISONE 20 MG PO TABS
20.0000 mg | ORAL_TABLET | Freq: Every day | ORAL | Status: DC
  Administered 2017-03-18 – 2017-03-20 (×3): 20 mg via ORAL
  Filled 2017-03-17 (×3): qty 1

## 2017-03-17 MED ORDER — HYDROMORPHONE HCL 2 MG PO TABS
2.0000 mg | ORAL_TABLET | Freq: Four times a day (QID) | ORAL | Status: DC | PRN
  Administered 2017-03-18 – 2017-03-20 (×9): 2 mg via ORAL
  Filled 2017-03-17 (×9): qty 1

## 2017-03-17 MED ORDER — FENTANYL CITRATE (PF) 100 MCG/2ML IJ SOLN: 12.0000 mL/h | INTRAMUSCULAR | Status: DC

## 2017-03-17 MED ORDER — CARBOXYMETHYLCELLULOSE SODIUM 0.5 % OP SOLN: 1.0000 [drp] | Freq: Every day | OPHTHALMIC | Status: DC

## 2017-03-17 MED ORDER — VANCOMYCIN HCL 1000 MG IV SOLR
INTRAVENOUS | Status: AC
  Filled 2017-03-17: qty 1000

## 2017-03-17 MED ORDER — BUPIVACAINE HCL (PF) 0.5 % IJ SOLN
INTRAMUSCULAR | Status: DC | PRN
  Administered 2017-03-17: 2 mL via INTRATHECAL
  Administered 2017-03-17: 5 mL via INTRATHECAL

## 2017-03-17 MED ORDER — SODIUM CHLORIDE 0.9 % IJ SOLN
12.0000 mL/h | INTRAMUSCULAR | Status: AC | PRN
  Administered 2017-03-17 – 2017-03-18 (×3): 12 mL/h via EPIDURAL
  Filled 2017-03-17 (×4): qty 3

## 2017-03-17 MED ORDER — PROPOFOL 500 MG/50ML IV EMUL
INTRAVENOUS | Status: DC | PRN
  Administered 2017-03-17: 50 ug/kg/min via INTRAVENOUS
  Administered 2017-03-17: 10:00:00 via INTRAVENOUS

## 2017-03-17 MED ORDER — RANITIDINE HCL 150 MG PO TABS
150.0000 mg | ORAL_TABLET | Freq: Two times a day (BID) | ORAL | Status: DC
  Administered 2017-03-17 – 2017-03-20 (×6): 150 mg via ORAL
  Filled 2017-03-17 (×6): qty 1

## 2017-03-17 MED ORDER — DEXMEDETOMIDINE HCL IN NACL 200 MCG/50ML IV SOLN
INTRAVENOUS | Status: DC | PRN
  Administered 2017-03-17: 11:00:00 via INTRAVENOUS
  Administered 2017-03-17: 0.4 ug/kg/h via INTRAVENOUS

## 2017-03-17 MED ORDER — FENTANYL CITRATE (PF) 100 MCG/2ML IJ SOLN
INTRAMUSCULAR | Status: DC | PRN
  Administered 2017-03-17: 50 ug via INTRAVENOUS

## 2017-03-17 MED ORDER — POLYETHYLENE GLYCOL 3350 17 G PO PACK
17.0000 g | PACK | Freq: Every day | ORAL | Status: DC
  Administered 2017-03-18 – 2017-03-20 (×3): 17 g via ORAL
  Filled 2017-03-17 (×3): qty 1

## 2017-03-17 MED ORDER — AMLODIPINE BESYLATE 10 MG PO TABS
10.0000 mg | ORAL_TABLET | Freq: Every day | ORAL | Status: DC
  Administered 2017-03-18 – 2017-03-20 (×3): 10 mg via ORAL
  Filled 2017-03-17 (×3): qty 1

## 2017-03-17 MED ORDER — FUROSEMIDE 40 MG PO TABS
40.0000 mg | ORAL_TABLET | ORAL | Status: DC
  Administered 2017-03-18 – 2017-03-20 (×2): 40 mg via ORAL
  Filled 2017-03-17 (×2): qty 1

## 2017-03-17 MED ORDER — EPHEDRINE 5 MG/ML INJ
INTRAVENOUS | Status: AC
  Filled 2017-03-17: qty 10

## 2017-03-17 MED ORDER — DEXAMETHASONE SODIUM PHOSPHATE 10 MG/ML IJ SOLN
INTRAMUSCULAR | Status: DC | PRN
  Administered 2017-03-17: 10 mg via INTRAVENOUS

## 2017-03-17 MED ORDER — CEFAZOLIN SODIUM-DEXTROSE 2-4 GM/100ML-% IV SOLN
2.0000 g | INTRAVENOUS | Status: AC
  Administered 2017-03-17 (×2): 2 g via INTRAVENOUS

## 2017-03-17 MED ORDER — 0.9 % SODIUM CHLORIDE (POUR BTL) OPTIME
TOPICAL | Status: DC | PRN
  Administered 2017-03-17: 1000 mL

## 2017-03-17 MED ORDER — VANCOMYCIN HCL 1000 MG IV SOLR
INTRAVENOUS | Status: DC | PRN
  Administered 2017-03-17: 1000 mg

## 2017-03-17 MED ORDER — METOCLOPRAMIDE HCL 5 MG PO TABS: 5.0000 mg | ORAL_TABLET | Freq: Three times a day (TID) | ORAL | Status: DC | PRN

## 2017-03-17 MED ORDER — VITAMIN B-12 1000 MCG PO TABS
1000.0000 ug | ORAL_TABLET | Freq: Two times a day (BID) | ORAL | Status: DC
  Administered 2017-03-17 – 2017-03-20 (×6): 1000 ug via ORAL
  Filled 2017-03-17 (×6): qty 1

## 2017-03-17 MED ORDER — VITAMIN D 1000 UNITS PO TABS
1000.0000 [IU] | ORAL_TABLET | Freq: Two times a day (BID) | ORAL | Status: DC
  Administered 2017-03-17 – 2017-03-20 (×6): 1000 [IU] via ORAL
  Filled 2017-03-17 (×8): qty 1

## 2017-03-17 MED ORDER — METOCLOPRAMIDE HCL 5 MG/ML IJ SOLN: 5.0000 mg | Freq: Three times a day (TID) | INTRAMUSCULAR | Status: DC | PRN

## 2017-03-17 MED ORDER — CEFAZOLIN SODIUM-DEXTROSE 1-4 GM/50ML-% IV SOLN
1.0000 g | Freq: Four times a day (QID) | INTRAVENOUS | Status: AC
  Administered 2017-03-17 – 2017-03-18 (×3): 1 g via INTRAVENOUS
  Filled 2017-03-17 (×3): qty 50

## 2017-03-17 SURGICAL SUPPLY — 96 items
ADH SKN CLS APL DERMABOND .7 (GAUZE/BANDAGES/DRESSINGS) ×1
ASMB TUBE 520 STRL RMR IRR (MISCELLANEOUS) ×1
BANDAGE ACE 4X5 VEL STRL LF (GAUZE/BANDAGES/DRESSINGS) ×1 IMPLANT
BANDAGE ACE 6X5 VEL STRL LF (GAUZE/BANDAGES/DRESSINGS) ×2 IMPLANT
BANDAGE ELASTIC 4 VELCRO ST LF (GAUZE/BANDAGES/DRESSINGS) ×1 IMPLANT
BANDAGE ELASTIC 6 VELCRO ST LF (GAUZE/BANDAGES/DRESSINGS) ×1 IMPLANT
BANDAGE ESMARK 6X9 LF (GAUZE/BANDAGES/DRESSINGS) ×1 IMPLANT
BIT DRILL CANN QC 4.3X180 (BIT) IMPLANT
BIT DRILL Q/COUPLING 1 (BIT) ×1 IMPLANT
BLADE SURG 10 STRL SS (BLADE) ×2 IMPLANT
BLADE SURG 15 STRL LF DISP TIS (BLADE) ×2 IMPLANT
BLADE SURG 15 STRL SS (BLADE)
BNDG CMPR 9X6 STRL LF SNTH (GAUZE/BANDAGES/DRESSINGS) ×1
BNDG COHESIVE 4X5 TAN STRL (GAUZE/BANDAGES/DRESSINGS) ×1 IMPLANT
BNDG ESMARK 6X9 LF (GAUZE/BANDAGES/DRESSINGS) ×2
BNDG GAUZE ELAST 4 BULKY (GAUZE/BANDAGES/DRESSINGS) ×4 IMPLANT
BONE CANC CHIPS 40CC CAN1/2 (Bone Implant) ×2 IMPLANT
BRUSH SCRUB SURG 4.25 DISP (MISCELLANEOUS) ×4 IMPLANT
CANISTER WOUND CARE 500ML ATS (WOUND CARE) ×1 IMPLANT
CHIPS CANC BONE 40CC CAN1/2 (Bone Implant) ×1 IMPLANT
CHLORAPREP W/TINT 10.5 ML (MISCELLANEOUS) ×1 IMPLANT
CLIP LOCKING FOR RIA (CLIP) ×1 IMPLANT
COVER MAYO STAND STRL (DRAPES) ×2 IMPLANT
COVER SURGICAL LIGHT HANDLE (MISCELLANEOUS) ×2 IMPLANT
CUFF TOURNIQUET SINGLE 34IN LL (TOURNIQUET CUFF) ×2 IMPLANT
DERMABOND ADVANCED (GAUZE/BANDAGES/DRESSINGS) ×1
DERMABOND ADVANCED .7 DNX12 (GAUZE/BANDAGES/DRESSINGS) IMPLANT
DRAPE C-ARM 42X72 X-RAY (DRAPES) ×2 IMPLANT
DRAPE C-ARMOR (DRAPES) ×2 IMPLANT
DRAPE HALF SHEET 40X57 (DRAPES) ×1 IMPLANT
DRAPE ORTHO SPLIT 77X108 STRL (DRAPES) ×4
DRAPE SURG ORHT 6 SPLT 77X108 (DRAPES) ×1 IMPLANT
DRAPE U-SHAPE 47X51 STRL (DRAPES) ×2 IMPLANT
DRILL BIT 4.3MM (BIT) ×2
DRIVE SHAFT SEAL STERILE ×1 IMPLANT
DRSG EMULSION OIL 3X3 NADH (GAUZE/BANDAGES/DRESSINGS) ×1 IMPLANT
DRSG MEPILEX BORDER 4X4 (GAUZE/BANDAGES/DRESSINGS) ×1 IMPLANT
DRSG MEPITEL 4X7.2 (GAUZE/BANDAGES/DRESSINGS) ×1 IMPLANT
DRSG PAD ABDOMINAL 8X10 ST (GAUZE/BANDAGES/DRESSINGS) ×1 IMPLANT
ELECT REM PT RETURN 9FT ADLT (ELECTROSURGICAL) ×2
ELECTRODE REM PT RTRN 9FT ADLT (ELECTROSURGICAL) ×1 IMPLANT
GAUZE SPONGE 4X4 12PLY STRL (GAUZE/BANDAGES/DRESSINGS) ×2 IMPLANT
GLOVE BIO SURGEON STRL SZ7.5 (GLOVE) ×4 IMPLANT
GLOVE BIO SURGEON STRL SZ8 (GLOVE) ×2 IMPLANT
GLOVE BIOGEL PI IND STRL 7.5 (GLOVE) ×1 IMPLANT
GLOVE BIOGEL PI INDICATOR 7.5 (GLOVE) ×3
GOWN STRL REUS W/ TWL LRG LVL3 (GOWN DISPOSABLE) ×2 IMPLANT
GOWN STRL REUS W/ TWL XL LVL3 (GOWN DISPOSABLE) ×1 IMPLANT
GOWN STRL REUS W/TWL LRG LVL3 (GOWN DISPOSABLE) ×4
GOWN STRL REUS W/TWL XL LVL3 (GOWN DISPOSABLE) ×2
GRAFT BNE CHIP CANC 1-8 40 (Bone Implant) IMPLANT
GRAFT FILTER FOR RIA 520 LGTH (MISCELLANEOUS) ×1 IMPLANT
KIT BASIN OR (CUSTOM PROCEDURE TRAY) ×2 IMPLANT
KIT PREVENA INCISION MGT20CM45 (CANNISTER) ×1 IMPLANT
KIT ROOM TURNOVER OR (KITS) ×2 IMPLANT
MANIFOLD NEPTUNE II (INSTRUMENTS) ×2 IMPLANT
NS IRRIG 1000ML POUR BTL (IV SOLUTION) ×2 IMPLANT
PACK ORTHO EXTREMITY (CUSTOM PROCEDURE TRAY) ×2 IMPLANT
PAD ARMBOARD 7.5X6 YLW CONV (MISCELLANEOUS) ×4 IMPLANT
PAD CAST 4YDX4 CTTN HI CHSV (CAST SUPPLIES) ×2 IMPLANT
PADDING CAST COTTON 4X4 STRL (CAST SUPPLIES) ×2
PADDING CAST COTTON 6X4 STRL (CAST SUPPLIES) ×3 IMPLANT
PENCIL BUTTON HOLSTER BLD 10FT (ELECTRODE) ×2 IMPLANT
PROS T PLATE LOCKING 8HOLE (Orthopedic Implant) ×2 IMPLANT
PROSTHESIS T PLATE LCKNG 8HOLE (Orthopedic Implant) IMPLANT
REAMER HEAD 14MM (MISCELLANEOUS) ×1 IMPLANT
REAMER ROD DEEP FLUTE 2.5X950 (INSTRUMENTS) ×1 IMPLANT
SCREW CORTEX ST 4.5X28 (Screw) ×1 IMPLANT
SCREW CORTEX ST 4.5X32 (Screw) ×1 IMPLANT
SCREW CORTEX ST 4.5X34 (Screw) ×1 IMPLANT
SCREW CORTEX ST 4.5X44 (Screw) ×1 IMPLANT
SCREW CORTEX ST 4.5X46 (Screw) ×1 IMPLANT
SCREW CORTEX ST 4.5X68 (Screw) ×1 IMPLANT
SCREW LOCK ST T25 SD 5.0X65 (Screw) ×2 IMPLANT
SLEEVE SURGEON STRL (DRAPES) ×1 IMPLANT
SPLINT FIBERGLASS 4X30 (CAST SUPPLIES) ×1 IMPLANT
SPLINT PLASTER CAST XFAST 5X30 (CAST SUPPLIES) IMPLANT
SPLINT PLASTER XFAST SET 5X30 (CAST SUPPLIES) ×1
SPONGE LAP 18X18 X RAY DECT (DISPOSABLE) ×4 IMPLANT
SUCTION FRAZIER HANDLE 10FR (MISCELLANEOUS) ×1
SUCTION TUBE FRAZIER 10FR DISP (MISCELLANEOUS) ×1 IMPLANT
SUT ETHILON 3 0 PS 1 (SUTURE) ×4 IMPLANT
SUT MNCRL AB 3-0 PS2 18 (SUTURE) ×1 IMPLANT
SUT VIC AB 2-0 CT1 27 (SUTURE) ×4
SUT VIC AB 2-0 CT1 TAPERPNT 27 (SUTURE) IMPLANT
SUT VIC AB 2-0 CT3 27 (SUTURE) IMPLANT
SUT VIC AB 2-0 SH 18 (SUTURE) ×2 IMPLANT
SUT VIC AB 3-0 FS2 27 (SUTURE) ×2 IMPLANT
SYSTEM CHEST DRAIN TLS 7FR (DRAIN) IMPLANT
TOWEL OR 17X24 6PK STRL BLUE (TOWEL DISPOSABLE) ×1 IMPLANT
TOWEL OR 17X26 10 PK STRL BLUE (TOWEL DISPOSABLE) ×2 IMPLANT
TUBE ASSEMBLY RIA STERILE (MISCELLANEOUS) ×1 IMPLANT
TUBE CONNECTING 12X1/4 (SUCTIONS) ×4 IMPLANT
UNDERPAD 30X30 (UNDERPADS AND DIAPERS) ×3 IMPLANT
WATER STERILE IRR 1000ML POUR (IV SOLUTION) ×1 IMPLANT
YANKAUER SUCT BULB TIP NO VENT (SUCTIONS) ×2 IMPLANT

## 2017-03-17 NOTE — Transfer of Care (Signed)
Immediate Anesthesia Transfer of Care Note  Patient: Troy Banks  Procedure(s) Performed: FUSION  PILON FRACTURE WITH BONE GRAFTING (Left Leg Lower)  Patient Location: PACU  Anesthesia Type:MAC, Spinal and Epidural  Level of Consciousness: awake and drowsy  Airway & Oxygen Therapy: Patient Spontanous Breathing and Patient connected to face mask oxygen  Post-op Assessment: Report given to RN and Post -op Vital signs reviewed and stable  Post vital signs: Reviewed and stable  Last Vitals:  Vitals:   03/17/17 0559  BP: (!) 196/63  Pulse: 60  Resp: 20  Temp: 36.6 C  SpO2: 99%    Last Pain:  Vitals:   03/17/17 0559  TempSrc: Oral         Complications: No apparent anesthesia complications

## 2017-03-17 NOTE — Op Note (Signed)
OrthopaedicSurgeryOperativeNote (NID:[id number]) Date of Surgery: 03/17/2017  Admit Date: 03/17/2017   Diagnoses: Pre-Op Diagnoses: Left displaced pilon fracture   Post-Op Diagnosis: Same  Procedures: 1. CPT 27870-Fusion of left ankle joint 2. CPT 20902-Bone graft from left femur 3. CPT 20694-Removal of external fixator 4. CPT 11044-Ex-fix pin site debridement 5. CPT 97605-Incisional wound vac  Surgeons: Primary: Shona Needles, MD   Location:[facility] OR ROOM 03   AnesthesiaGeneral   Antibiotics:Ancef 2g preop   Tourniquettime: Total Tourniquet Time Documented: Thigh (Left) - 47 minutes Total: Thigh (Left) - 47 minutes  [id number]  Complications:None  Specimens:None  Implants:  Implant Name Type Inv. Item Serial No. Manufacturer Lot No. LRB No. Used Action  DRIVE SHAFT SEAL STERILE - [id number]  DRIVE SHAFT SEAL STERILE  SYNTHES TRAUMA [id number] Left 1 Implanted  BONE [facility] CHIPS 40CC - [id number] Bone Implant BONE [facility] CHIPS 40CC [id number] [place] [facility] [id number] Left 1 Implanted  PROS T PLATE LOCKING 8HOLE - [id number] Orthopedic Implant PROS T PLATE LOCKING 8HOLE  SY[employer]  Left 1 Implanted  SCREW LOCKING 5.0MM 65MM - [id number] Screw SCREW LOCKING 5.0MM 65MM  SYNTHES [employer]  Left 2 Implanted  SCREW CORTEX 4.5 - [id number] Screw SCREW CORTEX 4.5  SYNTHES [employer]UMA  Left 1 Implanted  SCREW CORTEX 4.5 - [id number] Screw SCREW CORTEX 4.5  SYNTHES TRAUMA  Left 1 Implanted  SCREW CORTEX 4.5 - [id number] Screw SCREW CORTEX 4.5  SYNTHES TRAUMA  Left 1 Implanted  SCREW CORTEX 4.5 - [id number] Screw SCREW CORTEX 4.5  SYNTHES TRAUMA  Left 1 Implanted  SCREW CORTEX 4.5 - [id number] Screw SCREW CORTEX 4.5  SYNTHES TRAUMA  Left 1 Implanted  SCREW CORTEX 4.5 - [id number] Screw SCREW CORTEX 4.5   SYNTHES TRAUMA   Left 1 Implanted    IndicationsforSurgery: Mr. Saefong is a 76 year old male that fell off of a ladder and sustained and  severe closed left pilon fracture. He initially underwent external fixation with Dr. Percell Miller and I was consulted for definitive management. After reviewing the films and discussing options with the patient I believed that primary fusion is the best option. I feel that an attempt at ORIF will have high risk of failure with pain and postraumatic arthritis with a high likelihood of reoperative. A primary fusion would limit ankle ROM but would allow one surgery and in my opinion provide the most predictable and best outcome. Mr. Russom and his family agreed with this and wished to proceed. Risks discussed included bleeding requiring blood transfusion, bleeding causing a hematoma, infection, malunion, nonunion, damage to surrounding nerves and blood vessels, pain, hardware prominence or irritation, hardware failure, stiffness, post-traumatic arthritis, DVT/PE, compartment syndrome, and even death. Risks and benefits were extensively discussed as noted above and the patient and their family agreed to proceed with surgery and consent was obtained.  Operative Findings: 1. Primary fusion of left pilon fracture with posterolateral approach and fixation with 4.52mm locking T plate. 2. RIA bone grafting from left femur supplemented with allograft. 3. Removal of external fixator with debridement of external fixation pin sites.  Procedure: The patient was identified in the preoperative holding area. Consent was confirmed with the patient and their family and all questions were answered. The operative extremity was marked after confirmation with the patient. he was then brought back to the operating room by our anesthesia colleagues. He had a spinal/epidural placed by the anethesia team and was carefully transferred over to a radiolucent flat top table. Here he was sedated  and carefully positioned in the lateral decubitus position with all bony prominences well padded. The operative extremity was then prepped and draped in  usual sterile fashion. A preoperative timeout was performed to verify the patient, the procedure, and the extremity. Preoperative antibiotics were dosed.  A sterile tourniquet was placed and the leg was elevated and it was inflated. A made a standard posterolateral approach to the ankle. The sural nerve was identified and protected through the case. The peroneal fascia was incised in line with my incision and the FHL muscle belly was peeled carefully off of the posterior aspect of the tibia to exposed the posterior tibiotalar joint. The capsule was excised to visualize the articular surface.  Using a mixture of curettes and osteotomes the articular cartilage of the talus and the remaining cartilage of the tibia was removed down to cancellous bone. Once the joint was adequately prepared and all areas of the ankle were identified and addressed. A lap was placed in the wound and compressed and I turned my attention to the bone graft harvest.  Using AP and lateral views I made an incision proximal to the greater trochanter. The gluteus medius fascia was incised along the incision down to the greater troch. I directed a guidepin into the medullary canal of the proximal femur and used a 53mm entry reamer to enter the canal. A 2.69mm guidewire was place into the distal femur and a ruler was used to judge the size of the RIA reamer head. I chose a 58mm head. The RIA apparatus was set up and I proceeded to ream the center of the canal. I successfully obtained about 25-30 mL of bone graft. I combined this with 40 mL of cancellous bone graft.  I returned to the ankle and loosened the external fixator to manipulate the ankle into appropriate position. The bone graft was then placed into the ankle joint. I then chose an 8 hole 4.84mm T plate for the fixation. The plate was position and held provisionally. Fluoroscopy was used to confirm adequate placement of the plate. A nonlocking screw was placed into the shaft to bring  the plate to bone. Another shaft screw was placed proximal to the first screw. The ankle was held in appropriate dorsiflexion and varus/valgus and a nonlocking 4.15mm screw was placed obliquely from the tibia across the joint into the talus and had excellent purchase. Two 5.24mm locking screws were placed into the talus at the distal aspect of the T-plate. I then placed two more nonlocking screws into the shaft.  Final fluoroscopic images were obtained. More bone graft was placed around the plate and 1 gram of vancomycin powder was placed after irrigation. The incision was then closed with 0 vicryl, 2-0 vicryl, and 3-0 nylon. The proximal incision was closed with 2-0 vicryl and 3-0 monocryl and sealed with dermabond. The external fixation pins were removed and the tracts and bone were debrided with a curette. A Provena wound vac was placed over the ankle incision and connected to 135mmHg. A well padded short leg splint was placed and they were awoken from anesthesia.  Post Op Plan/Instructions: The patient will be nonweightbearing for 10-12 weeks. We will keep his splint in place and incisional vac until Monday and plan to change the dressing. Ancef for prophylaxis and ASA for DVT prophylaxis.  I was present and performed the entire surgery.  Katha Hamming, MD [facility]

## 2017-03-17 NOTE — Anesthesia Procedure Notes (Signed)
Spinal  Patient location during procedure: OR Staffing Anesthesiologist: Lyndle Herrlich Preanesthetic Checklist Completed: patient identified, site marked, surgical consent, pre-op evaluation, timeout performed, IV checked, risks and benefits discussed and monitors and equipment checked Spinal Block Patient position: sitting Prep: DuraPrep Patient monitoring: cardiac monitor, continuous pulse ox, blood pressure and heart rate Approach: midline Location: L3-4 Injection technique: catheter Needle Needle type: Tuohy and Sprotte  Needle gauge: 24 G Needle length: 12.7 cm Needle insertion depth: 6 cm Catheter type: closed end flexible Catheter size: 19 g Catheter at skin depth: 12 cm Assessment Sensory level: T10 Additional Notes Spinal Dosage in OR  .75% Bupivicaine ml       1.9   Catheter placed easily (-) asp heme/CSF  LLD x 3 min

## 2017-03-17 NOTE — Interval H&P Note (Signed)
History and Physical Interval Note:  03/17/2017 6:54 AM  Troy Banks  has presented today for surgery, with the diagnosis of LEFT PILON FRACTURE  The various methods of treatment have been discussed with the patient and family. After consideration of risks, benefits and other options for treatment, the patient has consented to  Procedure(s): FUSION VERSUS ORIF PILON FRACTURE WITH BONE GRAFTING (Left) as a surgical intervention .  The patient's history has been reviewed, patient examined, no change in status, stable for surgery.  I have reviewed the patient's chart and labs.  Questions were answered to the patient's satisfaction.     Nelli Swalley, Thomasene Lot

## 2017-03-17 NOTE — H&P (View-Only) (Signed)
Orthopaedic [facility]uma [facility] (OTS) Consult   Patient ID: Troy Banks MRN: [id number] DOB/AGE: March 01, 1941 76 y.o.   Reason for Consult:Left pilon fracture Referring Physician: Dr. Fredonia Highland, MD of [facility]  HPI: Troy Banks is an 76 y.o. male who is being seen in consultation at the request of Dr. Percell Miller for evaluation of his left pilon fracture. He was cleaning his gutters on a ladder when he fell and landed on his foot. He had immediate pain and deformity. He was evaluated in the ER where he was taken later that night for closed reduction and external fixation of his pilon fracture by Dr. Percell Miller. A postoperative CT scan was obtained which showed a significant pilon fracture with a very comminuted joint. I was asked to review the films and discuss options with the patient.  Past Medical History:  Diagnosis Date  . Allergy   . Anxiety    PTSD  . Aortic stenosis, moderate 03/27/2015   post AVR echo Echo 11/16: Mild LVH, EF 55-60%, normal wall motion, grade 1 diastolic dysfunction, bioprosthetic AVR okay (mean gradient 18 mmHg) with trivial AI, mild LAE, atrial septal aneurysm, small effusion   . Arthritis    "qwhere" (10/29/2015)  . Carotid artery stenosis    Carotid US 11/17: 1-39% bilateral ICA stenosis. F/u prn  . Cervical stenosis of spinal canal    fusion 2006  . CHF (congestive heart failure) ([place])    "secondry to OHS"  . COLONIC POLYPS, HX OF   . Complication of anesthesia    "takes a lot to put him under and has woken up during surgery" Difficult to wake up . PTSD; "does not metabolize RX well"  . DIVERTICULOSIS, COLON   . Dysrhythmia    2 bouts of afib post op and at home but corrected with medication.  . Familial tremor 03/19/2014  . GERD (gastroesophageal reflux disease)    uses Zantac  . GOUT   . Headache   . Heart murmur   . High cholesterol   . History of kidney stones   . HYPERTENSION    off ACEI 2010 because of hyperkalemia in  setting of ARI  . Hypothyroidism    newly diaganosed 09/2015  . Kidney stones    remote open stone-retrieval on L  . Myocardial infarction [facility])    October 20th 2016  . OSA on CPAP   . 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 ([place])    confirmed heterozygous 10/24/14 labs  . PTSD (post-traumatic stress disorder)   . Sleep apnea    On a CPAP    Past Surgical History:  Procedure Laterality Date  . ANTERIOR FUSION CERVICAL SPINE  2002  . AORTIC VALVE REPLACEMENT N/A 03/30/2015   Procedure: AORTIC VALVE REPLACEMENT (AVR);  Surgeon: Grace Isaac, MD;  Location: [place];  Service: Open Heart Surgery;  Laterality: N/A;  . BACK SURGERY    . CARDIAC CATHETERIZATION N/A 03/26/2015   Procedure: Left Heart Cath and Coronary Angiography;  Surgeon: Leonie Man, MD;  Location: [facility];  Service: Cardiovascular;  Laterality: N/A;  . CARDIAC VALVE REPLACEMENT    . CORONARY ARTERY BYPASS GRAFT N/A 03/30/2015   Procedure: CORONARY ARTERY BYPASS GRAFTING (CABG) x four, using left internal mammary artery and left    leg greater saphenous vein harvested endoscopically ;  Surgeon: Grace Isaac, MD;  Location: [place];  Service: Open Heart Surgery;  Laterality: N/A;  . CYSTOSCOPY/URETEROSCOPY/HOLMIUM LASER/STENT PLACEMENT  Bilateral 11/07/2016   Procedure: CYSTOSCOPY/URETEROSCOPY/HOLMIUM LASER/STENT PLACEMENT;  Surgeon: Nickie Retort, MD;  Location: [facility];  Service: Urology;  Laterality: Bilateral;  . CYSTOSCOPY/URETEROSCOPY/HOLMIUM LASER/STENT PLACEMENT Bilateral 11/23/2016   Procedure: CYSTOSCOPY/BILATERAL URETEROSCOPY/HOLMIUM LASER/STENT EXCHANGE;  Surgeon: Nickie Retort, MD;  Location: [facility];  Service: Urology;  Laterality: Bilateral;  NEEDS 90 MIN   . EXTERNAL FIXATION LEG Left 02/22/2017   Procedure: EXTERNAL FIXATION LEFT ANKLE;  Surgeon: Renette Butters, MD;  Location: [place];  Service: Orthopedics;  Laterality: Left;  . HEMORRHOID BANDING  1970s  .  INGUINAL HERNIA REPAIR Left 1962  . JOINT REPLACEMENT    . [place]   "cut me open"  . POSTERIOR FUSION CERVICAL SPINE  2006  . TEE WITHOUT CARDIOVERSION N/A 03/30/2015   Procedure: TRANSESOPHAGEAL ECHOCARDIOGRAM (TEE);  Surgeon: Grace Isaac, MD;  Location: [place];  Service: Open Heart Surgery;  Laterality: N/A;  . TESTICLE TORSION REDUCTION Right 1960  . TOE FUSION Left 1985 & 2004   "great toe"  . TONSILLECTOMY    . TOTAL HIP ARTHROPLASTY Right 10/27/2015   Procedure: RIGHT TOTAL HIP ARTHROPLASTY ANTERIOR APPROACH and steroid injection right foot;  Surgeon: Mcarthur Rossetti, MD;  Location: [place];  Service: Orthopedics;  Laterality: Right;    Family History  Problem Relation Age of Onset  . Heart disease Mother   . Hypertension Mother   . Diabetes Mother   . Heart disease Father   . Hypertension Father   . Prostate cancer Maternal Uncle   . Hypertension Maternal Grandmother   . Heart disease Maternal Grandmother   . Hypertension Maternal Grandfather   . Heart disease Maternal Grandfather   . Hypertension Paternal Grandfather   . Heart disease Paternal Grandfather   . Diabetes Paternal Grandfather   . Alcohol abuse Other   . Arthritis Other   . Colon polyps Neg Hx   . Esophageal cancer Neg Hx   . Rectal cancer Neg Hx   . Stomach cancer Neg Hx     Social History:  reports that he quit smoking about 26 years ago. His smoking use included Cigarettes. He has a 34.00 pack-year smoking history. He has never used smokeless tobacco. He reports that he drinks about 0.6 oz of alcohol per week . He reports that he does not use drugs.  Allergies:  Allergies  Allergen Reactions  . Benzodiazepines Other (See Comments)    Patient states he gets stroke symptoms with benzo's.   . Coreg [Carvedilol] Other (See Comments)    Accumulates and causes stroke-like side-effects due to 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 Patient poor metabolizer of CYP2D6 - Coreg  undergoes extensive hepatic (including 2D6)  . Lisinopril Other (See Comments)    Hyperkalemia. 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  . Mobic [Meloxicam] Other (See Comments)    Accumulates and causes stroke-like side-effects due to 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  . Sertraline Hcl Other (See Comments)    Accumulates and causes stroke-like side-effects due to 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  . Tramadol Other (See Comments)    Accumulates and causes stroke-like side-effects due to 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  . Atorvastatin Other (See Comments)    MYALGIAS  . Colchicine Other (See Comments)    ataxia  . Compazine [Prochlorperazine Maleate] Other (See Comments)    Accumulates and causes stroke-like side-effects due to 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  . Flomax [Tamsulosin Hcl]     Accumulates and causes  stroke-like side-effects due to 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  . Ondansetron Other (See Comments)    Accumulates and causes stroke-like side-effects due to 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   . Promethazine Other (See Comments)    Accumulates and causes stroke-like side-effects due to 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  . Acyclovir And Related Other (See Comments)    Accumulates and causes stroke-like side-effects due to 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    Medications: I have reviewed the patient's current medications.  ROS: Negative for fevers, chills, SOB, chest pain, bowel or bladder problems.  Blood pressure 122/60, pulse (!) 53, temperature 97.8 F (36.6 C), temperature source Oral, resp. rate 19, height 5\' 8"  (1.727 m), weight 92.1 kg (203 lb), SpO2 99 %. Exam: General-well appearing male, no acute distress, awake alert and oriented. Cooperative and pleasant LLE: ex fix in place. Pin sites were just redressed and clean at the time of eval. ACE wrap in place that was removed during exam and significant swelling to  the anterior and lateral ankle without any skin wrinkling. There is minimal wrinkling posterolaterally. There is a small serous fracture blister anteromedially. Full sensation to foot and toes. +EHL/FHL and warm and well perfused foot. Some ecchymosis. No deformity or instability of knee or hip.  RLE: Skin without lesions, no tenderness to palpation, full painless ROM with full strength in each muscle groups without evidence of instability.  Imaging: X-rays preoperatively show a comminuted pilon fracture with significant shortening. The talus in proximally migrated with notable varus.  CT scan was reviewed as well which shows a significantly comminuted pilon fracture with impaction of central articular surface with significant bone voids. There are some articular fragments that are flipped 180 degrees.  Assessment/Plan: 76 year old male with significantly comminuted pilon fracture of left ankle s/p external fixation  After reviewing the films and discussing options with the patient I believe that primary fusion is the best option. I feel that an attempt at ORIF will have high risk of failure with pain and postraumatic arthritis with a high likelihood of reoperative. A primary fusion would limit ankle ROM but would allow one surgery and in my opinion provide the most predictable and best outcome. I feel that currently he is too swollen for an anterior or lateral approach.  -NWB LLE -Elevation and compression to help with swelling -Will evaluate skin on Monday and make a determination about surgical fixation next week  Shona Needles, MD [facility] [phone] (phone)

## 2017-03-17 NOTE — Anesthesia Procedure Notes (Signed)
Procedure Name: MAC Date/Time: 03/17/2017 7:55 AM Performed by: Willeen Cass P Pre-anesthesia Checklist: Patient identified, Emergency Drugs available, Suction available, Patient being monitored and Timeout performed Patient Re-evaluated:Patient Re-evaluated prior to induction Oxygen Delivery Method: Simple face mask Induction Type: IV induction Placement Confirmation: positive ETCO2 Dental Injury: Teeth and Oropharynx as per pre-operative assessment

## 2017-03-17 NOTE — Progress Notes (Signed)
Fentanyl 110mcg with Bupivacaine60mg  in 0.9ns60cc started via epidural at 1438 dose changed by [facility] pharmacist to be equal to dr Eduard Roux previous order previous ordered dose never started

## 2017-03-17 NOTE — Progress Notes (Signed)
Pt. From [facility],Pt. Unsure of all meds that were taken today.

## 2017-03-17 NOTE — Anesthesia Postprocedure Evaluation (Signed)
Anesthesia Post Note  Patient: VILIAMI BRACCO  Procedure(s) Performed: FUSION  PILON FRACTURE WITH BONE GRAFTING (Left Leg Lower)     Patient location during evaluation: PACU Anesthesia Type: Spinal Level of consciousness: awake Pain management: satisfactory to patient Vital Signs Assessment: post-procedure vital signs reviewed and stable Respiratory status: spontaneous breathing Cardiovascular status: blood pressure returned to baseline Postop Assessment: no headache and spinal receding Anesthetic complications: no    Last Vitals:  Vitals:   03/17/17 1430 03/17/17 1445  BP: (!) 130/58   Pulse: (!) 50   Resp: 17 (P) 16  Temp:    SpO2: 97%     Last Pain:  Vitals:   03/17/17 1438  TempSrc:   PainSc: 0-No pain            L Sensory Level: (P) L3-Anterior knee, lower leg (03/17/17 1445) R Sensory Level: (P) S1-Sole of foot, small toes (03/17/17 1445)  Jaleiyah Alas EDWARD

## 2017-03-17 NOTE — Progress Notes (Signed)
Pt c/o of left eye feeling scratched no reddnedd or irritation noted dr Lyndle Herrlich notifed and at bedside no new rx obtained will apply warm compressess as requested by md

## 2017-03-18 LAB — BASIC METABOLIC PANEL
ANION GAP: 8 (ref 5–15)
BUN: 17 mg/dL (ref 6–20)
CALCIUM: 8.8 mg/dL — AB (ref 8.9–10.3)
CO2: 26 mmol/L (ref 22–32)
Chloride: 103 mmol/L (ref 101–111)
Creatinine, Ser: 0.76 mg/dL (ref 0.61–1.24)
GLUCOSE: 127 mg/dL — AB (ref 65–99)
POTASSIUM: 4.1 mmol/L (ref 3.5–5.1)
SODIUM: 137 mmol/L (ref 135–145)

## 2017-03-18 LAB — CBC
HEMATOCRIT: 37.7 % — AB (ref 39.0–52.0)
HEMOGLOBIN: 12.3 g/dL — AB (ref 13.0–17.0)
MCH: 28.9 pg (ref 26.0–34.0)
MCHC: 32.6 g/dL (ref 30.0–36.0)
MCV: 88.7 fL (ref 78.0–100.0)
Platelets: 201 10*3/uL (ref 150–400)
RBC: 4.25 MIL/uL (ref 4.22–5.81)
RDW: 13.9 % (ref 11.5–15.5)
WBC: 12.5 10*3/uL — AB (ref 4.0–10.5)

## 2017-03-18 MED ORDER — PRIMIDONE 50 MG PO TABS
100.0000 mg | ORAL_TABLET | Freq: Every day | ORAL | Status: DC
  Administered 2017-03-18 – 2017-03-19 (×2): 100 mg via ORAL
  Filled 2017-03-18 (×2): qty 2

## 2017-03-18 MED ORDER — PRIMIDONE 50 MG PO TABS
150.0000 mg | ORAL_TABLET | Freq: Every morning | ORAL | Status: DC
  Administered 2017-03-18 – 2017-03-20 (×3): 150 mg via ORAL
  Filled 2017-03-18 (×3): qty 3

## 2017-03-18 NOTE — Progress Notes (Signed)
Anesthesiology:  Epidural catheter d/ced. Site OK, tip intact.  Roberts Gaudy

## 2017-03-18 NOTE — Evaluation (Signed)
Occupational Therapy Evaluation Patient Details Name: Troy Banks MRN: [id number] DOB: May 05, 1941 Today's Date: 03/18/2017    History of Present Illness Pt is a 76 yo male who fell from a ladder in september and fractured his ankle. was d/c'd to rehab with L ex-fix. Pt re-admitted for L fusion of ankle, and removal of ex fix.   Clinical Impression   Pt reports he was requiring assist with ADL at rehab since initial injury. Currently pt requires min guard assist for functional mobility and mod assist for LB ADL. Pt able to maintain LLE NWB throughout all activity this session. Pt planning to d/c home with 24/7 supervision from family. Pt would benefit from continued skilled OT to address established goals.    Follow Up Recommendations  No OT follow up;Supervision/Assistance - 24 hour    Equipment Recommendations  3 in 1 bedside commode;Tub/shower seat    Recommendations for Other Services       Precautions / Restrictions Precautions Precautions: Fall;Back Precaution Comments: reviewed back precautions however pt unable to follow functionally Required Braces or Orthoses: Spinal Brace Spinal Brace: Lumbar corset;Applied in sitting position Restrictions Weight Bearing Restrictions: Yes LLE Weight Bearing: Non weight bearing      Mobility Bed Mobility Overal bed mobility: Modified Independent Bed Mobility: Supine to Sit     Supine to sit: Modified independent (Device/Increase time)     General bed mobility comments: HOB elevated and pulled self into long sit despite v/c's to log roll to adhere to back precautions, pt able to manage L LE without assist  Transfers Overall transfer level: Needs assistance Equipment used: Rolling walker (2 wheeled) Transfers: Sit to/from Stand Sit to Stand: Min guard         General transfer comment: v/c's for hand placement to push up from bed and reach back for chair    Balance Overall balance assessment: Needs  assistance Sitting-balance support: No upper extremity supported Sitting balance-Leahy Scale: Good     Standing balance support: Bilateral upper extremity supported Standing balance-Leahy Scale: Poor Standing balance comment: needs RW due to L LE NWB                           ADL either performed or assessed with clinical judgement   ADL Overall ADL's : Needs assistance/impaired Eating/Feeding: Set up;Sitting   Grooming: Set up;Sitting   Upper Body Bathing: Set up;Supervision/ safety;Sitting   Lower Body Bathing: Moderate assistance;Sit to/from stand   Upper Body Dressing : Set up;Supervision/safety;Sitting Upper Body Dressing Details (indicate cue type and reason): for brace management Lower Body Dressing: Moderate assistance;Sit to/from stand   Toilet Transfer: Min guard;Ambulation;RW           Functional mobility during ADLs: Min guard;Rolling walker       Vision         Perception     Praxis      Pertinent Vitals/Pain Pain Assessment: 0-10 Pain Score: 8  Pain Location: L LE Pain Descriptors / Indicators: Throbbing;Spasm Pain Intervention(s): Monitored during session;Repositioned;Ice applied     Hand Dominance Left   Extremity/Trunk Assessment Upper Extremity Assessment Upper Extremity Assessment: Overall WFL for tasks assessed   Lower Extremity Assessment Lower Extremity Assessment: Defer to PT evaluation LLE Deficits / Details: pt able to complete SLR  and wiggle toes but no ankle mvmt due to splint   Cervical / Trunk Assessment Cervical / Trunk Assessment: Other exceptions Cervical / Trunk Exceptions: back precautions  Communication Communication Communication: No difficulties   Cognition Arousal/Alertness: Awake/alert Behavior During Therapy: WFL for tasks assessed/performed Overall Cognitive Status: Within Functional Limits for tasks assessed                                     General Comments        Exercises     Shoulder Instructions      Home Living Family/patient expects to be discharged to:: Private residence Living Arrangements: Spouse/significant other Available Help at Discharge: Family;Available 24 hours/day Type of Home: House Home Access: Stairs to enter [employer] of Steps: 4 Entrance Stairs-Rails: Right;Left Home Layout: One level     Bathroom Shower/Tub: Occupational psychologist: Handicapped height     Home Equipment: Environmental consultant - 2 wheels;Cane - single point (plans to borrow knee scooter)          Prior Functioning/Environment Level of Independence: Independent        Comments: was up on a ladder cleaning gutters        OT Problem List: Impaired balance (sitting and/or standing);Decreased knowledge of use of DME or AE;Decreased knowledge of precautions;Pain      OT Treatment/Interventions: Self-care/ADL training;DME and/or AE instruction;Energy conservation;Therapeutic activities;Patient/family education;Balance training    OT Goals(Current goals can be found in the care plan section) Acute Rehab OT Goals Patient Stated Goal: To get back to working out at [employer] Goal Formulation: With patient/family Time For Goal Achievement: 04/01/17 Potential to Achieve Goals: Good ADL Goals Pt Will Perform Lower Body Bathing: with supervision;sit to/from stand Pt Will Perform Lower Body Dressing: with supervision;sit to/from stand Pt Will Transfer to Toilet: with supervision;ambulating;bedside commode Pt Will Perform Toileting - Clothing Manipulation and hygiene: with supervision;sit to/from stand Pt Will Perform Tub/Shower Transfer: with supervision;Shower transfer;ambulating;shower seat;rolling walker  OT Frequency: Min 2X/week   Barriers to D/C:            Co-evaluation PT/OT/SLP Co-Evaluation/Treatment: Yes Reason for Co-Treatment: To address functional/ADL transfers   OT goals addressed during session: ADL's and  self-care      AM-PAC PT "6 Clicks" Daily Activity     Outcome Measure Help from another person eating meals?: None Help from another person taking care of personal grooming?: None Help from another person toileting, which includes using toliet, bedpan, or urinal?: A Little Help from another person bathing (including washing, rinsing, drying)?: A Lot Help from another person to put on and taking off regular upper body clothing?: A Little Help from another person to put on and taking off regular lower body clothing?: A Lot 6 Click Score: 18   End of Session Equipment Utilized During Treatment: Gait belt;Rolling walker Nurse Communication: Mobility status;Weight bearing status  Activity Tolerance: Patient tolerated treatment well Patient left: in chair;with call bell/phone within reach;with family/visitor present  OT Visit Diagnosis: Unsteadiness on feet (R26.81);Other abnormalities of gait and mobility (R26.89);Pain Pain - Right/Left: Left Pain - part of body: Leg;Ankle and joints of foot                Time: 7858-8502 OT Time Calculation (min): 30 min Charges:  OT General Charges $OT Visit: 1 Visit OT Evaluation $OT Eval Moderate Complexity: 1 Mod G-Codes:     Taytum Scheck A. Ulice Brilliant, M.S., OTR/L Pager: [place] 03/18/2017, 2:02 PM

## 2017-03-18 NOTE — Progress Notes (Signed)
Orthopedic Tech Progress Note Patient Details:  Troy Banks July 30, 1940 [id number]  Patient ID: Troy Banks, male   DOB: 05-Oct-1940, 76 y.o.   MRN: [id number] Pt cant have ohf due to age restrictions.  Karolee Stamps 03/18/2017, 12:25 AM

## 2017-03-18 NOTE — Progress Notes (Signed)
Orthopaedic Trauma Progress Note  S: Doing well overnight, a little hypertensive but states because he hasnt slept  O:  Vitals:   03/18/17 0010 03/18/17 0542  BP: (!) 158/68 (!) 180/74  Pulse: 62 60  Resp: 19 20  Temp: 98.1 F (36.7 C) 98.9 F (37.2 C)  SpO2: 98% 99%    Labs: Hgb 12.3  A/P: POD1 s/p primary fusion of left ankle with bone graft harvest  -NWB LLE -Splint clean, dry and intact until Monday -PT/OT -D/C epidural and foley catheter -Pain control -ASA 81 for DVT prophylaxis  Shona Needles, MD [facility] [phone] (phone)

## 2017-03-18 NOTE — Addendum Note (Signed)
Addendum  created 03/18/17 0934 by Roberts Gaudy, MD   Sign clinical note

## 2017-03-18 NOTE — Evaluation (Signed)
Physical Therapy Evaluation Patient Details Name: Troy Banks MRN: [id number] DOB: 1941/04/19 Today's Date: 03/18/2017   History of Present Illness  Pt is a 76 yo male who fell from a ladder in september and fractured his ankle. was d/c'd to rehab with L ex-fix. Pt re-admitted for L fusion of ankle, and removal of ex fix.  Clinical Impression  Pt able to maintain L LE NWB during transfers and ambulation. Pt motivated to achieve indep. Pt with good home set up and support. Pt safe to d/c home with 24/7 assist and use of RW once MD clears medically.    Follow Up Recommendations No PT follow up;Supervision/Assistance - 24 hour (out pt PT for ankle once cleared by MD)    Equipment Recommendations  None recommended by PT    Recommendations for Other Services       Precautions / Restrictions Precautions Precautions: Fall;Back Precaution Comments: reviewed back precautions however pt unable to follow functionally Required Braces or Orthoses: Spinal Brace Spinal Brace: Lumbar corset;Applied in sitting position Restrictions Weight Bearing Restrictions: Yes LLE Weight Bearing: Non weight bearing      Mobility  Bed Mobility Overal bed mobility: Modified Independent Bed Mobility: Supine to Sit     Supine to sit: Modified independent (Device/Increase time)     General bed mobility comments: HOB elevated and pulled self into long sit despite v/c's to log roll to adhere to back precautions, pt able to manage L LE without assist  Transfers Overall transfer level: Needs assistance Equipment used: Rolling walker (2 wheeled) Transfers: Sit to/from Stand Sit to Stand: Min guard         General transfer comment: v/c's for hand placement to push up from bed and reach back for chair  Ambulation/Gait Ambulation/Gait assistance: Min guard Ambulation Distance (Feet): 50 Feet (x2) Assistive device: Rolling walker (2 wheeled) Gait Pattern/deviations: Step-to pattern Gait  velocity: slow Gait velocity interpretation: Below normal speed for age/gender General Gait Details: pt able to maintain L LE NWB, pt with onset of fatigue towards end and then had difficulty with clearing foot  Stairs            Wheelchair Mobility    Modified Rankin (Stroke Patients Only)       Balance Overall balance assessment: Needs assistance Sitting-balance support: No upper extremity supported Sitting balance-Leahy Scale: Good     Standing balance support: Bilateral upper extremity supported Standing balance-Leahy Scale: Poor Standing balance comment: needs RW due to L LE NWB                             Pertinent Vitals/Pain Pain Assessment: 0-10 Pain Score: 8  Pain Location: L LE Pain Descriptors / Indicators: Throbbing;Spasm Pain Intervention(s): Monitored during session    Home Living Family/patient expects to be discharged to:: Private residence Living Arrangements: Spouse/significant other Available Help at Discharge: Family;Available 24 hours/day Type of Home: House Home Access: Stairs to enter Entrance Stairs-Rails: Psychiatric nurse of Steps: 4 Home Layout: One level Home Equipment: Walker - 2 wheels;Cane - single point (plans to borrow knee scooter)      Prior Function Level of Independence: Independent         Comments: was up on a ladder cleaning gutters     Hand Dominance   Dominant Hand: Left    Extremity/Trunk Assessment   Upper Extremity Assessment Upper Extremity Assessment: Overall WFL for tasks assessed    Lower Extremity Assessment  Lower Extremity Assessment: LLE deficits/detail LLE Deficits / Details: pt able to complete SLR  and wiggle toes but no ankle mvmt due to splint    Cervical / Trunk Assessment Cervical / Trunk Assessment: Other exceptions Cervical / Trunk Exceptions: back precautions  Communication   Communication: No difficulties  Cognition Arousal/Alertness:  Awake/alert Behavior During Therapy: WFL for tasks assessed/performed Overall Cognitive Status: Within Functional Limits for tasks assessed                                        General Comments      Exercises     Assessment/Plan    PT Assessment Patient needs continued PT services  PT Problem List Decreased mobility;Decreased knowledge of precautions;Decreased activity tolerance;Pain;Impaired sensation;Decreased knowledge of use of DME;Decreased balance       PT Treatment Interventions DME instruction;Therapeutic activities;Gait training;Therapeutic exercise;Balance training;Stair training;Functional mobility training    PT Goals (Current goals can be found in the Care Plan section)  Acute Rehab PT Goals Patient Stated Goal: To get back to working out at [employer] PT Goal Formulation: With patient/family Time For Goal Achievement: 03/25/17 Potential to Achieve Goals: Good    Frequency Min 3X/week   Barriers to discharge        Co-evaluation               AM-PAC PT "6 Clicks" Daily Activity  Outcome Measure Difficulty turning over in bed (including adjusting bedclothes, sheets and blankets)?: A Little Difficulty moving from lying on back to sitting on the side of the bed? : A Little Difficulty sitting down on and standing up from a chair with arms (e.g., wheelchair, bedside commode, etc,.)?: A Little Help needed moving to and from a bed to chair (including a wheelchair)?: A Little Help needed walking in hospital room?: A Little Help needed climbing 3-5 steps with a railing? : Total 6 Click Score: 16    End of Session Equipment Utilized During Treatment: Gait belt Activity Tolerance: Patient tolerated treatment well Patient left: in chair;with call bell/phone within reach;with family/visitor present Nurse Communication: Mobility status PT Visit Diagnosis: Difficulty in walking, not elsewhere classified (R26.2);Pain Pain - Right/Left:  Left Pain - part of body: Ankle and joints of foot    Time: 6314-9702 PT Time Calculation (min) (ACUTE ONLY): 30 min   Charges:   PT Evaluation $PT Eval Moderate Complexity: 1 Mod     PT G Codes:        Kittie Plater, PT, DPT Pager #: [phone] Office #: [phone]   Troy Banks 03/18/2017, 12:59 PM

## 2017-03-19 ENCOUNTER — Encounter (HOSPITAL_COMMUNITY): Payer: Self-pay | Admitting: *Deleted

## 2017-03-19 LAB — CBC
HCT: 36.9 % — ABNORMAL LOW (ref 39.0–52.0)
HEMOGLOBIN: 11.9 g/dL — AB (ref 13.0–17.0)
MCH: 28.7 pg (ref 26.0–34.0)
MCHC: 32.2 g/dL (ref 30.0–36.0)
MCV: 89.1 fL (ref 78.0–100.0)
PLATELETS: 178 10*3/uL (ref 150–400)
RBC: 4.14 MIL/uL — AB (ref 4.22–5.81)
RDW: 14.5 % (ref 11.5–15.5)
WBC: 10.2 10*3/uL (ref 4.0–10.5)

## 2017-03-19 LAB — BASIC METABOLIC PANEL
ANION GAP: 6 (ref 5–15)
BUN: 19 mg/dL (ref 6–20)
CO2: 29 mmol/L (ref 22–32)
Calcium: 8.8 mg/dL — ABNORMAL LOW (ref 8.9–10.3)
Chloride: 99 mmol/L — ABNORMAL LOW (ref 101–111)
Creatinine, Ser: 0.8 mg/dL (ref 0.61–1.24)
GFR calc Af Amer: >60 mL/min (ref >60)
GFR calc non Af Amer: >60 mL/min (ref >60)
GLUCOSE: 105 mg/dL — AB (ref 65–99)
POTASSIUM: 3.7 mmol/L (ref 3.5–5.1)
Sodium: 134 mmol/L — ABNORMAL LOW (ref 135–145)

## 2017-03-19 NOTE — Progress Notes (Signed)
Occupational Therapy Treatment Patient Details Name: Troy Banks MRN: [id number] DOB: 09/03/1940 Today's Date: 03/19/2017    History of present illness Pt is a 76 yo male who fell from a ladder in september and fractured his ankle. was d/c'd to rehab with L ex-fix. Pt re-admitted for L fusion of ankle, and removal of ex fix.   OT comments  Pt able to perform functional mobility this session with min guard assist. Educated pt on walk in shower transfer technique while maintaining LLE NWB; pt able to return demo with min assist for balance, pt reports wife will be able to assist as needed. D/c plan remains appropriate. Will continue to follow acutely.   Follow Up Recommendations  No OT follow up;Supervision/Assistance - 24 hour    Equipment Recommendations  3 in 1 bedside commode;Tub/shower seat    Recommendations for Other Services      Precautions / Restrictions Precautions Precautions: Fall;Back Required Braces or Orthoses: Spinal Brace Spinal Brace: Lumbar corset;Applied in sitting position Restrictions Weight Bearing Restrictions: Yes LLE Weight Bearing: Non weight bearing       Mobility Bed Mobility Overal bed mobility: Modified Independent Bed Mobility: Rolling;Sidelying to Sit;Sit to Sidelying Rolling: Modified independent (Device/Increase time) Sidelying to sit: Modified independent (Device/Increase time)     Sit to sidelying: Modified independent (Device/Increase time) General bed mobility comments: Good log roll technique this session  Transfers Overall transfer level: Needs assistance Equipment used: Rolling walker (2 wheeled) Transfers: Sit to/from Stand Sit to Stand: Min guard         General transfer comment: good hand placement and technique. min guard for balance and safety    Balance Overall balance assessment: Needs assistance Sitting-balance support: Feet supported Sitting balance-Leahy Scale: Good     Standing balance support:  Bilateral upper extremity supported Standing balance-Leahy Scale: Poor                             ADL either performed or assessed with clinical judgement   ADL Overall ADL's : Needs assistance/impaired                 Upper Body Dressing : Set up;Sitting Upper Body Dressing Details (indicate cue type and reason): for brace management     Toilet Transfer: Min guard;Ambulation;RW       Tub/ Shower Transfer: Minimal assistance;3 in 1;Rolling walker;Ambulation;Walk-in Lobbyist Details (indicate cue type and reason): Educated pt on walk in shower transfer technique and pt able to perform with min guard-min assist for balance. Pt able to maintain LLE NWB throughout Functional mobility during ADLs: Min guard;Rolling walker       Vision       Perception     Praxis      Cognition Arousal/Alertness: Awake/alert Behavior During Therapy: WFL for tasks assessed/performed Overall Cognitive Status: Within Functional Limits for tasks assessed                                          Exercises     Shoulder Instructions       General Comments      Pertinent Vitals/ Pain       Pain Assessment: Faces Faces Pain Scale: Hurts even more Pain Location: LLE Pain Descriptors / Indicators: Spasm Pain Intervention(s): Monitored during session;Ice applied  Home Living  Prior Functioning/Environment              Frequency  Min 2X/week        Progress Toward Goals  OT Goals(current goals can now be found in the care plan section)  Progress towards OT goals: Progressing toward goals  Acute Rehab OT Goals Patient Stated Goal: To get back to working out at [employer] OT Goal Formulation: With patient  Plan Discharge plan remains appropriate    Co-evaluation                 AM-PAC PT "6 Clicks" Daily Activity     Outcome Measure   Help from  another person eating meals?: None Help from another person taking care of personal grooming?: None Help from another person toileting, which includes using toliet, bedpan, or urinal?: A Little Help from another person bathing (including washing, rinsing, drying)?: A Lot Help from another person to put on and taking off regular upper body clothing?: A Little Help from another person to put on and taking off regular lower body clothing?: A Lot 6 Click Score: 18    End of Session Equipment Utilized During Treatment: Rolling walker;Back brace  OT Visit Diagnosis: Unsteadiness on feet (R26.81);Other abnormalities of gait and mobility (R26.89);Pain Pain - Right/Left: Left Pain - part of body: Leg;Ankle and joints of foot   Activity Tolerance Patient tolerated treatment well   Patient Left in bed;with call bell/phone within reach   Nurse Communication          Time: 7026-3785 OT Time Calculation (min): 18 min  Charges: OT General Charges $OT Visit: 1 Visit OT Treatments $Self Care/Home Management : 8-22 mins  Jewel Mcafee A. Ulice Brilliant, M.S., OTR/L Pager: [place] 03/19/2017, 9:42 AM

## 2017-03-19 NOTE — Progress Notes (Signed)
Orthopaedic Trauma Progress Note  S: Had some issues with pain because he felt he wasn't getting it in soon enough intervals  O:  Vitals:   03/19/17 0100 03/19/17 0523  BP:  (!) 161/72  Pulse:  (!) 57  Resp: 18 18  Temp:  98.3 F (36.8 C)  SpO2:  98%    Labs: Hgb 11.9  A/P: POD2 s/p primary fusion of left ankle with bone graft harvest  -NWB LLE -Splint clean, dry and intact until Monday -PT/OT -Pain control -ASA 81 for DVT prophylaxis -Likely D/C tomorrow if doing well  Shona Needles, MD [facility] [phone] (phone)

## 2017-03-20 ENCOUNTER — Encounter (HOSPITAL_COMMUNITY): Payer: Self-pay | Admitting: Student

## 2017-03-20 MED ORDER — HYDROMORPHONE HCL 2 MG PO TABS: 2.0000 mg | ORAL_TABLET | Freq: Four times a day (QID) | ORAL | 0 refills | Status: DC | PRN

## 2017-03-20 MED ORDER — TIZANIDINE HCL 4 MG PO TABS: 4.0000 mg | ORAL_TABLET | Freq: Three times a day (TID) | ORAL | 0 refills | Status: DC | PRN

## 2017-03-20 MED ORDER — ACETAMINOPHEN 500 MG PO TABS: 1000.0000 mg | ORAL_TABLET | Freq: Four times a day (QID) | ORAL | 2 refills | Status: DC

## 2017-03-20 NOTE — Progress Notes (Signed)
Orthopedic Tech Progress Note Patient Details:  Troy Banks 1941-03-20 [id number]  Ortho Devices Type of Ortho Device: CAM walker Ortho Device/Splint Interventions: Application   [name] 03/20/2017, 11:26 AM

## 2017-03-20 NOTE — Discharge Summary (Signed)
Orthopaedic Trauma Service (OTS)  Patient ID: Troy Banks MRN: [id number] DOB/AGE: Apr 24, 1941 76 y.o.  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 Haddix 1. CPT 27870-Fusion of left ankle joint 2. CPT 20902-Bone graft from left femur- RIA  3. CPT 20694-Removal of external fixator 4. CPT 11044-Ex-fix pin site debridement 5. CPT 97605-Incisional wound vac   Discharged Condition: good  Hospital Course:   76 year old white male who sustained a fall  off a ladder with a complex left tibial plafond fracture dislocation.  Patient underwent emergent external fixation.  Orthopedic trauma service was consulted for definitive management.  Due to significant soft tissue swelling patient was ultimately discharged from the hospital and readmitted for this admission and procedure which are noted above.  Due to severe joint destruction the decision was made to proceed primary ankle fusion.  Patient was agreeable with the procedure.  Patient underwent procedure as noted above.  Patient was admitted on 03/17/2017.  The procedures noted above.  Patient did not have any acute  issues during his hospital stay.  After surgery he was transferred to the PACU for recovery from anesthesia and then transferred to the orthopedic floor for observation, pain control and therapies.  Again no significant issues were noted during his hospital stay.  Patient remained in the house for 3 postoperative days.  He worked very diligently referred to physical therapy.  We were able to transition him to all oral pain medicine and on postoperative day #3 patient was deemed stable for discharge to home.  Patient was covered with aspirin for DVT prophylaxis given the distal nature of his injury.  He received routine perioperative antibiotics.  Patient discharged home in stable condition on 03/20/2017.  Due to alteration in drug metabolism we did confer with pharmacy.  It was decided that Dilaudid best option for the patient.  Consults: None  Significant Diagnostic Studies: labs:   Results for GLENARD, KEESLING (MRN [id number]) as of 04/06/2017 09:39  Ref. Range 03/19/2017 05:18  Sodium Latest Ref Range: 135 - 145 mmol/L 134 (L)  Potassium Latest Ref Range: 3.5 - 5.1 mmol/L 3.7  Chloride Latest Ref Range: 101 - 111 mmol/L 99 (L)  CO2 Latest Ref Range: 22 - 32 mmol/L 29  Glucose Latest Ref Range: 65 - 99 mg/dL 105 (H)  BUN Latest Ref Range: 6 - 20 mg/dL 19  Creatinine Latest Ref Range: 0.61 - 1.24 mg/dL 0.80  Calcium Latest Ref Range: 8.9 - 10.3 mg/dL 8.8 (L)  Anion gap Latest Ref Range: 5 - 15  6  GFR, Est Non African American Latest Ref Range: >60 mL/min >60  GFR, Est African American Latest Ref Range: >60 mL/min >60  WBC Latest Ref Range: 4.0 - 10.5 K/uL 10.2  RBC  Latest Ref Range: 4.22 - 5.81 MIL/uL 4.14 (L)  Hemoglobin Latest Ref Range: 13.0 - 17.0 g/dL 11.9 (L)  HCT Latest Ref Range: 39.0 - 52.0 % 36.9 (L)  MCV Latest Ref Range: 78.0 - 100.0 fL 89.1  MCH Latest Ref Range: 26.0 - 34.0 pg 28.7  MCHC Latest Ref Range: 30.0 - 36.0 g/dL 32.2  RDW Latest Ref Range: 11.5 - 15.5 % 14.5   Platelets Latest Ref Range: 150 - 400 K/uL 178    Treatments: IV hydration, antibiotics: Ancef, analgesia: Dilaudid and Zanaflex, cardiac meds: amlodipine, anticoagulation: ASA, therapies: PT, OT and RN and surgery: As above   Disposition: 01-Home or Self Care  Discharge Instructions    Call MD / Call 911    Complete by:  As directed    If you experience chest pain or shortness of breath, CALL 911 and be transported to the hospital emergency room.  If you develope a fever above 101 F, pus (white drainage) or increased drainage or redness at the wound, or calf pain, call your surgeon's office.   Constipation Prevention    Complete by:  As directed    Drink plenty of fluids.  Prune juice may be helpful.  You may use a stool softener, such as Colace (over the counter) 100 mg twice a day.  Use MiraLax (over the counter) for constipation as needed.   Diet - low sodium heart healthy    Complete by:  As directed    Discharge instructions    Complete by:  As directed    Orthopaedic Trauma Service Discharge Instructions   General Discharge Instructions  WEIGHT BEARING STATUS: Nonweightbearing left leg  RANGE OF MOTION/ACTIVITY: activity as tolerated while maintaining weightbearing restrictions on left lower extremity. Toe motion as tolerated.   Wound Care: daily dressing changes starting on 03/22/2017. See below. Continue to use ace wrap or compression sock/TED hose for swelling control. Continue to use ice for swelling and pain control   Discharge Wound Care Instructions  Do NOT apply any ointments, solutions or lotions to pin sites or surgical wounds.  These prevent needed drainage and even though solutions like hydrogen peroxide kill bacteria, they also damage cells lining the pin sites that help fight infection.  Applying lotions or ointments can keep the wounds moist and can cause them to breakdown and open up as well. This can increase the risk for infection. When in doubt call the  office.  Surgical incisions should be dressed daily.  If any drainage is noted, use one layer of adaptic, then gauze, Kerlix, and an ace wrap.  Once the incision is completely dry and without drainage, it may be left open to air out.  Showering may begin 36-48 hours later.  Cleaning gently with soap and water.  Traumatic wounds should be dressed daily as well.    One layer of adaptic, gauze, Kerlix, then ace wrap.  The adaptic can be discontinued once the draining has ceased    If you have a wet to dry dressing: wet the gauze with saline the squeeze as much saline out so the gauze is moist (not soaking wet), place moistened gauze over wound, then place a dry gauze over the moist one, followed by Kerlix wrap, then ace wrap.    DVT/PE prophylaxis: 81 mg aspirin daily   Diet: as you were eating previously.  Can use over the counter stool softeners and bowel preparations, such as Miralax, to help with bowel movements.  Narcotics can be constipating.  Be sure to drink plenty of fluids  PAIN MEDICATION USE AND EXPECTATIONS  You have likely been given narcotic medications to help control your pain.  After a traumatic event that results in an fracture (broken bone) with or without surgery, it is ok to use narcotic pain medications to help control one's pain.  We understand that everyone responds to pain differently and each individual patient will be evaluated on a regular basis for the continued need for narcotic medications. Ideally, narcotic medication use should last no more than 6-8 weeks (coinciding with fracture healing).   As a patient it is your responsibility as well to monitor narcotic medication use and report the amount and frequency you use these medications when you come to your office visit.   We would also advise that if you are using narcotic medications, you should take a dose prior to therapy to maximize you participation.  IF YOU ARE ON NARCOTIC MEDICATIONS IT IS NOT PERMISSIBLE  TO OPERATE A MOTOR VEHICLE (MOTORCYCLE/CAR/TRUCK/MOPED) OR HEAVY MACHINERY DO NOT MIX NARCOTICS WITH OTHER CNS (CENTRAL NERVOUS SYSTEM) DEPRESSANTS SUCH AS ALCOHOL   STOP SMOKING OR USING NICOTINE PRODUCTS!!!!  As discussed nicotine severely impairs your body's ability to heal surgical and traumatic wounds but also impairs bone healing.  Wounds and bone heal by forming microscopic blood vessels (angiogenesis) and nicotine is a vasoconstrictor (essentially, shrinks blood vessels).  Therefore, if vasoconstriction occurs to these microscopic blood vessels they essentially disappear and are unable to deliver necessary nutrients to the healing tissue.  This is one modifiable factor that you can do to dramatically increase your chances of healing your injury.    (This means no smoking, no nicotine gum, patches, etc)  DO NOT USE NONSTEROIDAL ANTI-INFLAMMATORY DRUGS (NSAID'S)  Using products such as Advil (ibuprofen), Aleve (naproxen), Motrin (ibuprofen) for additional pain control during fracture healing can delay and/or prevent the healing response.  If you would like to take over the counter (OTC) medication, Tylenol (acetaminophen) is ok.  However, some narcotic medications that are given for pain control contain acetaminophen as well. Therefore, you should not exceed more than 4000 mg of tylenol in a day if you do not have liver disease.  Also note that there are may OTC medicines, such as cold medicines and allergy medicines that my contain tylenol as well.  If you have any questions about medications and/or interactions please ask your doctor/PA or your pharmacist.      ICE AND ELEVATE INJURED/OPERATIVE EXTREMITY  Using ice and elevating the injured extremity above your heart can help with swelling and pain control.  Icing in a pulsatile fashion, such as 20 minutes on and 20 minutes off, can be followed.    Do not place ice directly on skin. Make sure there is a barrier between to skin and the ice pack.     Using frozen items such as frozen peas works well as the conform nicely to the are that needs to be iced.  USE AN ACE WRAP OR TED HOSE FOR SWELLING CONTROL  In addition to icing and elevation, Ace wraps or TED hose are used to help limit and resolve swelling.  It is recommended to use Ace wraps or TED hose until you are informed to stop.    When using Ace Wraps start the wrapping distally (farthest away from the body) and wrap proximally (closer to the body)   Example: If you had surgery on your leg or thing and you do not have a  splint on, start the ace wrap at the toes and work your way up to the thigh        If you had surgery on your upper extremity and do not have a splint on, start the ace wrap at your fingers and work your way up to the upper arm  IF YOU ARE IN A SPLINT OR CAST DO NOT [place]   If your splint gets wet for any reason please contact the office immediately. You may shower in your splint or cast as long as you keep it dry.  This can be done by wrapping in a cast cover or garbage back (or similar)  Do Not stick any thing down your splint or cast such as pencils, money, or hangers to try and scratch yourself with.  If you feel itchy take benadryl as prescribed on the bottle for itching  IF YOU ARE IN A CAM BOOT (BLACK BOOT)  You may remove boot periodically. Perform daily dressing changes as noted below.  Wash the liner of the boot regularly and wear a sock when wearing the boot. It is recommended that you sleep in the boot until told otherwise  CALL THE OFFICE WITH ANY QUESTIONS OR CONCERNS: [phone]   Driving restrictions    Complete by:  As directed    No driving until further notice   Increase activity slowly as tolerated    Complete by:  As directed    Non weight bearing    Complete by:  As directed    Laterality:  left   Extremity:  Lower     Allergies as of 03/20/2017      Reactions   Benzodiazepines Other (See Comments)   Patient states  he gets stroke symptoms with benzo's.    Coreg [carvedilol] Other (See Comments)   Accumulates and causes stroke-like side-effects due to 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 Patient poor metabolizer of CYP2D6 - Coreg undergoes extensive hepatic (including 2D6)   Lisinopril Other (See Comments)   Hyperkalemia. 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   Mobic [meloxicam] Other (See Comments)   Accumulates and causes stroke-like side-effects due to 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   Sertraline Hcl Other (See Comments)   Accumulates and causes stroke-like side-effects due to 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   Tramadol Other (See Comments)   Accumulates and causes stroke-like side-effects due to 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   Atorvastatin Other (See Comments)   MYALGIAS   Colchicine Other (See Comments)   ataxia   Compazine [prochlorperazine Maleate] Other (See Comments)   Accumulates and causes stroke-like side-effects due to 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   Flomax [tamsulosin Hcl]    Accumulates and causes stroke-like side-effects due to 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   Ondansetron Other (See Comments)   Accumulates and causes stroke-like side-effects due to 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   Promethazine Other (See Comments)   Accumulates and causes stroke-like side-effects due to 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   Acyclovir And Related Other (See Comments)   Accumulates and causes stroke-like side-effects due to 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      Medication List    STOP taking these medications   ketorolac 10 MG tablet Commonly known as:  TORADOL     TAKE these medications   acetaminophen 500 MG tablet Commonly known as:  TYLENOL Take 2 tablets (1,000 mg total) by mouth every 6 (six) hours. What changed:  medication strength  how much to take  when to take this  reasons to take this   allopurinol 300 MG  tablet Commonly known as:  ZYLOPRIM Take 300 mg by mouth daily. (0900)   amLODipine 10 MG tablet Commonly known as:  NORVASC TAKE 1 TABLET BY MOUTH EVERY DAY What changed:  See the new instructions.   aspirin 81 MG chewable tablet Chew 81 mg by mouth daily. (0900)   bisacodyl 10 MG suppository Commonly known as:  DULCOLAX Place 10 mg rectally daily as needed for moderate constipation (for constipation not relieved by milk of magnesia).   cholecalciferol 1000 units tablet Commonly known as:  VITAMIN D Take 1,000 Units by mouth 2 (two) times daily. (0900 & 2100)   furosemide 40 MG tablet Commonly known as:  LASIX TAKE 1 TABLET BY MOUTH EVERY OTHER DAY   HYDROmorphone 2 MG tablet Commonly known as:  DILAUDID Take 1 tablet (2 mg total) by mouth every 6 (six) hours as needed for moderate pain or severe pain.   levothyroxine 25 MCG tablet Commonly known as:  SYNTHROID, LEVOTHROID TAKE 1 TABLET BY MOUTH EVERY DAY BEFORE BREAKFAST What changed:  See the new instructions.   LUBRICANT EYE DROPS OP Place 1 drop into both eyes at bedtime.   magnesium hydroxide 400 MG/5ML suspension Commonly known as:  MILK OF MAGNESIA Take 30 mLs by mouth daily as needed for mild constipation (for constipation (IF NO BOWEL MOVEMENT IN 3 DAYS)).   polyethylene glycol packet Commonly known as:  MIRALAX / GLYCOLAX Take 17 g by mouth daily. What changed:  additional instructions   predniSONE 20 MG tablet Commonly known as:  DELTASONE Take 1 tablet (20 mg total) by mouth daily with breakfast. What changed:  additional instructions   primidone 50 MG tablet Commonly known as:  MYSOLINE 150 mg in the morning, 100 mg in the evening What changed:  how much to take  how to take this  when to take this  additional instructions   RA SALINE ENEMA 19-7 GM/118ML Enem Place 1 Bottle rectally daily as needed (for constipation not relieved by bisacodyl suppository).   ranitidine 150 MG  tablet Commonly known as:  ZANTAC TAKE 1 TABLET BY MOUTH 2 TIMES DAILY What changed:  See the new instructions.   rosuvastatin 10 MG tablet Commonly known as:  CRESTOR Take 0.5 tablets (5 mg total) by mouth daily. What changed:  when to take this  additional instructions   tiZANidine 4 MG tablet Commonly known as:  ZANAFLEX Take 1 tablet (4 mg total) by mouth every 8 (eight) hours as needed for muscle spasms. What changed:  See the new instructions.   Turmeric 500 MG Caps Take 500 mg by mouth 2 (two) times daily. (0900 & 2100)   vitamin B-12 1000 MCG tablet Commonly known as:  CYANOCOBALAMIN Take 1,000 mcg by mouth 2 (two) times daily. (0900 & 2100)   vitamin C 1000 MG tablet Take 1,000 mg by mouth 2 (two) times daily. (0900 & 2100)            Discharge Care Instructions        Start     Ordered   03/20/17 0000  Non weight bearing    Question Answer Comment  Laterality left   Extremity Lower      03/20/17 1310     Follow-up [facility] Follow up.   Why:  3n1 will be delivered prior to discharge Contact information: [address]  [place] [address] [phone]        Shona Needles, MD. Schedule an appointment as soon as possible for a visit in 10 day(s).   Specialty:  Orthopedic Surgery Contact information: [address] [place] [address] [phone]            Signed:  Jari Pigg, [name] [facility] [phone] (P) 04/06/2017, 9:32 AM

## 2017-03-20 NOTE — Care Management Note (Signed)
Case Management Note  Patient Details  Name: AIVEN KAMPE MRN: [id number] Date of Birth: 12-Jun-1940  Subjective/Objective:   76 yr old gentleman s/p fusion of pilon fracture, left lower leg.  Action/Plan: No home health needs, CM requested DME. Patient will have assistance at discharge.    Expected Discharge Date:   03/20/17               Expected Discharge Plan:  Home/Self Care  In-House Referral:  NA  Discharge planning Services  CM Consult  Post Acute Care Choice:  Durable Medical Equipment Choice offered to:  Patient  DME Arranged:  3-N-1 DME Agency:  [place]:  NA [place] Agency:  NA  Status of Service:  Completed, signed off  If discussed at [place] of Stay Meetings, dates discussed:    Additional Comments:  Ninfa Meeker, RN 03/20/2017, 11:34 AM

## 2017-03-20 NOTE — Progress Notes (Signed)
Physical Therapy Treatment Patient Details Name: Troy Banks MRN: [id number] DOB: 1940-08-12 Today's Date: 03/20/2017    History of Present Illness Pt is a 76 yo male who fell from a ladder in september and fractured his ankle. was d/c'd to rehab with L ex-fix. Pt re-admitted for L fusion of ankle, and removal of ex fix.    PT Comments    Patient progressing and able to negotiate stairs with assist.  Issued handout for technique so can ask family/caregivers to assist.  Feel pt stable for d/c and though would benefit from follow up HHPT, he declines.     Follow Up Recommendations  No PT follow up;Supervision/Assistance - 24 hour (reviewed with pt and does not want HHPT right now, though if needed to contact MD)     Equipment Recommendations  None recommended by PT    Recommendations for Other Services       Precautions / Restrictions Precautions Precautions: Fall;Back Required Braces or Orthoses: Spinal Brace Spinal Brace: Lumbar corset;Applied in sitting position Restrictions Weight Bearing Restrictions: Yes LLE Weight Bearing: Non weight bearing    Mobility  Bed Mobility Overal bed mobility: Needs Assistance       Supine to sit: Supervision;HOB elevated     General bed mobility comments: sat up not following back precautions, reminded need for log roll, to supine through sidelying  Transfers Overall transfer level: Needs assistance Equipment used: Bilateral platform walker;Ambulation equipment used (knee walker\) Transfers: Sit to/from Stand Sit to Stand: Min guard;Supervision         General transfer comment: cues for safety and transition to knee walker with hands on RW for safety; demonstrated how to lock knee walker and unlock  Ambulation/Gait Ambulation/Gait assistance: Min guard;Supervision Ambulation Distance (Feet): 110 Feet Assistive device: Rolling walker (2 wheeled) (knee walker) Gait Pattern/deviations: Step-to pattern     General Gait  Details: cues throughout for posture, to ensure hips level to avoid pain in back   Stairs Stairs: Yes   Stair Management: One rail Left;With crutches;Step to pattern;Forwards Number of Stairs: 2 General stair comments: x 2; cues and encouragement for technique  Wheelchair Mobility    Modified Rankin (Stroke Patients Only)       Balance Overall balance assessment: Needs assistance   Sitting balance-Leahy Scale: Fair     Standing balance support: Bilateral upper extremity supported   Standing balance comment: needs RW due to L LE NWB                            Cognition Arousal/Alertness: Awake/alert Behavior During Therapy: WFL for tasks assessed/performed Overall Cognitive Status: Within Functional Limits for tasks assessed                                        Exercises      General Comments General comments (skin integrity, edema, etc.): states girlfriend already has knee walker for home, also already has crutch to do stairs at home; issued handout on stairs for home      Pertinent Vitals/Pain Faces Pain Scale: Hurts little more Pain Location: LLE Pain Descriptors / Indicators: Discomfort Pain Intervention(s): Monitored during session;Repositioned    Home Living                      Prior Function  PT Goals (current goals can now be found in the care plan section) Progress towards PT goals: Progressing toward goals    Frequency    Min 3X/week      PT Plan Current plan remains appropriate    Co-evaluation              AM-PAC PT "6 Clicks" Daily Activity  Outcome Measure  Difficulty turning over in bed (including adjusting bedclothes, sheets and blankets)?: A Little Difficulty moving from lying on back to sitting on the side of the bed? : A Little Difficulty sitting down on and standing up from a chair with arms (e.g., wheelchair, bedside commode, etc,.)?: A Little Help needed moving to and  from a bed to chair (including a wheelchair)?: A Little Help needed walking in hospital room?: A Little Help needed climbing 3-5 steps with a railing? : A Little 6 Click Score: 18    End of Session Equipment Utilized During Treatment: Gait belt;Back brace Activity Tolerance: Patient tolerated treatment well Patient left: in bed;with call bell/phone within reach   PT Visit Diagnosis: Difficulty in walking, not elsewhere classified (R26.2) Pain - Right/Left: Left Pain - part of body: Ankle and joints of foot     Time: 0927-0952 PT Time Calculation (min) (ACUTE ONLY): 25 min  Charges:  $Gait Training: 8-22 mins $Self Care/Home Management: 8-22                    G CodesMagda Banks, [place] 03/20/2017    Troy Banks 03/20/2017, 1:51 PM

## 2017-03-20 NOTE — Discharge Instructions (Signed)
[facility] Discharge Instructions   General Discharge Instructions  WEIGHT BEARING STATUS: Nonweightbearing left leg  RANGE OF MOTION/ACTIVITY: activity as tolerated while maintaining weightbearing restrictions on left lower extremity. Toe motion as tolerated.   Wound Care: daily dressing changes starting on 03/22/2017. See below. Continue to use ace wrap or compression sock/TED hose for swelling control. Continue to use ice for swelling and pain control   Discharge Wound Care Instructions  Do NOT apply any ointments, solutions or lotions to pin sites or surgical wounds.  These prevent needed drainage and even though solutions like hydrogen peroxide kill bacteria, they also damage cells lining the pin sites that help fight infection.  Applying lotions or ointments can keep the wounds moist and can cause them to breakdown and open up as well. This can increase the risk for infection. When in doubt call the office.  Surgical incisions should be dressed daily.  If any drainage is noted, use one layer of adaptic, then gauze, Kerlix, and an ace wrap.  Once the incision is completely dry and without drainage, it may be left open to air out.  Showering may begin 36-48 hours later.  Cleaning gently with soap and water.  Traumatic wounds should be dressed daily as well.    One layer of adaptic, gauze, Kerlix, then ace wrap.  The adaptic can be discontinued once the draining has ceased    If you have a wet to dry dressing: wet the gauze with saline the squeeze as much saline out so the gauze is moist (not soaking wet), place moistened gauze over wound, then place a dry gauze over the moist one, followed by Kerlix wrap, then ace wrap.    DVT/PE prophylaxis: 81 mg aspirin daily   Diet: as you were eating previously.  Can use over the counter stool softeners and bowel preparations, such as Miralax, to help with bowel movements.  Narcotics can be constipating.  Be sure to drink  plenty of fluids  PAIN MEDICATION USE AND EXPECTATIONS  You have likely been given narcotic medications to help control your pain.  After a traumatic event that results in an fracture (broken bone) with or without surgery, it is ok to use narcotic pain medications to help control one's pain.  We understand that everyone responds to pain differently and each individual patient will be evaluated on a regular basis for the continued need for narcotic medications. Ideally, narcotic medication use should last no more than 6-8 weeks (coinciding with fracture healing).   As a patient it is your responsibility as well to monitor narcotic medication use and report the amount and frequency you use these medications when you come to your office visit.   We would also advise that if you are using narcotic medications, you should take a dose prior to therapy to maximize you participation.  IF YOU ARE ON NARCOTIC MEDICATIONS IT IS NOT PERMISSIBLE TO OPERATE A MOTOR VEHICLE (MOTORCYCLE/CAR/TRUCK/MOPED) OR HEAVY MACHINERY DO NOT MIX NARCOTICS WITH OTHER CNS (CENTRAL NERVOUS SYSTEM) DEPRESSANTS SUCH AS ALCOHOL   STOP SMOKING OR USING NICOTINE PRODUCTS!!!!  As discussed nicotine severely impairs your body's ability to heal surgical and traumatic wounds but also impairs bone healing.  Wounds and bone heal by forming microscopic blood vessels (angiogenesis) and nicotine is a vasoconstrictor (essentially, shrinks blood vessels).  Therefore, if vasoconstriction occurs to these microscopic blood vessels they essentially disappear and are unable to deliver necessary nutrients to the healing tissue.  This is one modifiable factor  that you can do to dramatically increase your chances of healing your injury.    (This means no smoking, no nicotine gum, patches, etc)  DO NOT USE NONSTEROIDAL ANTI-INFLAMMATORY DRUGS (NSAID'S)  Using products such as Advil (ibuprofen), Aleve (naproxen), Motrin (ibuprofen) for additional pain  control during fracture healing can delay and/or prevent the healing response.  If you would like to take over the counter (OTC) medication, Tylenol (acetaminophen) is ok.  However, some narcotic medications that are given for pain control contain acetaminophen as well. Therefore, you should not exceed more than 4000 mg of tylenol in a day if you do not have liver disease.  Also note that there are may OTC medicines, such as cold medicines and allergy medicines that my contain tylenol as well.  If you have any questions about medications and/or interactions please ask your doctor/PA or your pharmacist.      ICE AND ELEVATE INJURED/OPERATIVE EXTREMITY  Using ice and elevating the injured extremity above your heart can help with swelling and pain control.  Icing in a pulsatile fashion, such as 20 minutes on and 20 minutes off, can be followed.    Do not place ice directly on skin. Make sure there is a barrier between to skin and the ice pack.    Using frozen items such as frozen peas works well as the conform nicely to the are that needs to be iced.  USE AN ACE WRAP OR TED HOSE FOR SWELLING CONTROL  In addition to icing and elevation, Ace wraps or TED hose are used to help limit and resolve swelling.  It is recommended to use Ace wraps or TED hose until you are informed to stop.    When using Ace Wraps start the wrapping distally (farthest away from the body) and wrap proximally (closer to the body)   Example: If you had surgery on your leg or thing and you do not have a splint on, start the ace wrap at the toes and work your way up to the thigh        If you had surgery on your upper extremity and do not have a splint on, start the ace wrap at your fingers and work your way up to the upper arm  IF YOU ARE IN A SPLINT OR CAST DO NOT [place]   If your splint gets wet for any reason please contact the office immediately. You may shower in your splint or cast as long as you keep it dry.   This can be done by wrapping in a cast cover or garbage back (or similar)  Do Not stick any thing down your splint or cast such as pencils, money, or hangers to try and scratch yourself with.  If you feel itchy take benadryl as prescribed on the bottle for itching  IF YOU ARE IN A CAM BOOT (BLACK BOOT)  You may remove boot periodically. Perform daily dressing changes as noted below.  Wash the liner of the boot regularly and wear a sock when wearing the boot. It is recommended that you sleep in the boot until told otherwise  CALL THE OFFICE WITH ANY QUESTIONS OR CONCERNS: [phone]

## 2017-03-22 ENCOUNTER — Telehealth: Payer: Self-pay | Admitting: *Deleted

## 2017-03-22 NOTE — Telephone Encounter (Signed)
Pt was on the TCM list admitted 03/17/17 for Pilon fracture  & Pilon fracture of left tibia. Pt had a FUSION PILON FRACTURE WITH BONE GRAFTING, and D/c 03/20/17 and will follow-up w/specialist Dr. Lennette Bihari Haddix, in 2 weeks...[name]

## 2017-03-28 DIAGNOSIS — S82872D Displaced pilon fracture of left tibia, subsequent encounter for closed fracture with routine healing: Secondary | ICD-10-CM | POA: Diagnosis not present

## 2017-03-30 ENCOUNTER — Ambulatory Visit: Payer: Medicare Other | Admitting: Cardiology

## 2017-03-31 DIAGNOSIS — Z7982 Long term (current) use of aspirin: Secondary | ICD-10-CM | POA: Diagnosis not present

## 2017-03-31 DIAGNOSIS — S82872D Displaced pilon fracture of left tibia, subsequent encounter for closed fracture with routine healing: Secondary | ICD-10-CM | POA: Diagnosis not present

## 2017-03-31 DIAGNOSIS — W11XXXD Fall on and from ladder, subsequent encounter: Secondary | ICD-10-CM | POA: Diagnosis not present

## 2017-04-04 DIAGNOSIS — W11XXXD Fall on and from ladder, subsequent encounter: Secondary | ICD-10-CM | POA: Diagnosis not present

## 2017-04-04 DIAGNOSIS — Z7982 Long term (current) use of aspirin: Secondary | ICD-10-CM | POA: Diagnosis not present

## 2017-04-04 DIAGNOSIS — S82872D Displaced pilon fracture of left tibia, subsequent encounter for closed fracture with routine healing: Secondary | ICD-10-CM | POA: Diagnosis not present

## 2017-04-06 DIAGNOSIS — W11XXXD Fall on and from ladder, subsequent encounter: Secondary | ICD-10-CM | POA: Diagnosis not present

## 2017-04-06 DIAGNOSIS — Z7982 Long term (current) use of aspirin: Secondary | ICD-10-CM | POA: Diagnosis not present

## 2017-04-06 DIAGNOSIS — S82872D Displaced pilon fracture of left tibia, subsequent encounter for closed fracture with routine healing: Secondary | ICD-10-CM | POA: Diagnosis not present

## 2017-04-11 DIAGNOSIS — Z7982 Long term (current) use of aspirin: Secondary | ICD-10-CM | POA: Diagnosis not present

## 2017-04-11 DIAGNOSIS — S82872D Displaced pilon fracture of left tibia, subsequent encounter for closed fracture with routine healing: Secondary | ICD-10-CM | POA: Diagnosis not present

## 2017-04-11 DIAGNOSIS — W11XXXD Fall on and from ladder, subsequent encounter: Secondary | ICD-10-CM | POA: Diagnosis not present

## 2017-04-13 DIAGNOSIS — Z7982 Long term (current) use of aspirin: Secondary | ICD-10-CM | POA: Diagnosis not present

## 2017-04-13 DIAGNOSIS — W11XXXD Fall on and from ladder, subsequent encounter: Secondary | ICD-10-CM | POA: Diagnosis not present

## 2017-04-13 DIAGNOSIS — S82872D Displaced pilon fracture of left tibia, subsequent encounter for closed fracture with routine healing: Secondary | ICD-10-CM | POA: Diagnosis not present

## 2017-04-18 DIAGNOSIS — S82872D Displaced pilon fracture of left tibia, subsequent encounter for closed fracture with routine healing: Secondary | ICD-10-CM | POA: Diagnosis not present

## 2017-04-18 DIAGNOSIS — W11XXXD Fall on and from ladder, subsequent encounter: Secondary | ICD-10-CM | POA: Diagnosis not present

## 2017-04-18 DIAGNOSIS — Z7982 Long term (current) use of aspirin: Secondary | ICD-10-CM | POA: Diagnosis not present

## 2017-04-20 DIAGNOSIS — S82872D Displaced pilon fracture of left tibia, subsequent encounter for closed fracture with routine healing: Secondary | ICD-10-CM | POA: Diagnosis not present

## 2017-04-20 DIAGNOSIS — Z7982 Long term (current) use of aspirin: Secondary | ICD-10-CM | POA: Diagnosis not present

## 2017-04-20 DIAGNOSIS — W11XXXD Fall on and from ladder, subsequent encounter: Secondary | ICD-10-CM | POA: Diagnosis not present

## 2017-04-21 ENCOUNTER — Other Ambulatory Visit: Payer: Self-pay | Admitting: Cardiology

## 2017-04-25 DIAGNOSIS — S82872D Displaced pilon fracture of left tibia, subsequent encounter for closed fracture with routine healing: Secondary | ICD-10-CM | POA: Diagnosis not present

## 2017-04-25 DIAGNOSIS — Z7982 Long term (current) use of aspirin: Secondary | ICD-10-CM | POA: Diagnosis not present

## 2017-04-25 DIAGNOSIS — W11XXXD Fall on and from ladder, subsequent encounter: Secondary | ICD-10-CM | POA: Diagnosis not present

## 2017-04-27 NOTE — Progress Notes (Signed)
05/01/2017 Troy Banks   1940/06/13  [id number]  Primary Physician Troy Borg, MD Primary Cardiologist: Dr Yavuz Kirby [name]  HPI:  76 y.o. Male seen for evaluation of chest pain. He was admitted 10/19-10/31/16 with a non-STEMI. LHC demonstrated 3 vessel CAD. He also had significant aortic stenosis.  He underwent CABG with LIMA-LAD, SVG-intermediate, SVG-LCx, SVG-distal RCA and bioprosthetic AVR. Atenolol was initiated but later stopped secondary to bradycardia. He did develop atrial fibrillation. He was placed on amiodarone with return to NSR. He has maintained NSR and has recovered well. He has an essential tremor and is on Primidone chronically. 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 Amiodarone was stopped in January 2017 at the time he was hospitalized for an acute gout attack in his [place]. He has maintained NSR.  His wife keeps close check on him. She is a retired [employer]ar[employer] manager. He has a daughter who is a family physician in [place].  On his last visit in May 2018 he complained of more dyspnea on exertion and some chest tightness.  CXR was clear. Myoview study was normal.  In June he underwent cystoscopy and lithotripsy for renal stones.   In September he fell off his roof fracturing his left ankle and had a compression fracture of L2. He required surgery of his ankle. He is still non weight bearing.   On follow up today he does note that since using a walker he notes more sternal tenderness. BP at home around 140/70 but sometimes gets higher readings. Notes some dizziness when sugar gets out of whack.    Current Outpatient Medications  Medication Sig Dispense Refill  . acetaminophen (TYLENOL) 500 MG tablet Take 2 tablets (1,000 mg total) by mouth every 6 (six) hours. 90 tablet 2  . allopurinol (ZYLOPRIM) 300 MG tablet Take 300 mg by mouth daily. (0900)    . amLODipine (NORVASC) 10 MG tablet TAKE 1 TABLET BY  MOUTH EVERY DAY (Patient taking differently: TAKE 1 TABLET BY MOUTH EVERY DAY (0900)) 90 tablet 2  . Ascorbic Acid (VITAMIN C) 1000 MG tablet Take 1,000 mg by mouth 2 (two) times daily. (0900 & 2100)    . aspirin 81 MG chewable tablet Chew 81 mg by mouth daily. (0900)    . bisacodyl (DULCOLAX) 10 MG suppository Place 10 mg rectally daily as needed for moderate constipation (for constipation not relieved by milk of magnesia).    . Carboxymethylcellulose Sodium (LUBRICANT EYE DROPS OP) Place 1 drop into both eyes at bedtime.    . cholecalciferol (VITAMIN D) 1000 units tablet Take 1,000 Units by mouth 2 (two) times daily. (0900 & 2100)    . furosemide (LASIX) 40 MG tablet TAKE 1 TABLET BY MOUTH EVERY OTHER DAY 45 tablet 3  . HYDROmorphone (DILAUDID) 2 MG tablet Take 1 tablet (2 mg total) by mouth every 6 (six) hours as needed for moderate pain or severe pain. 56 tablet 0  . levothyroxine (SYNTHROID, LEVOTHROID) 25 MCG tablet TAKE 1 TABLET BY MOUTH EVERY DAY BEFORE BREAKFAST (Patient taking differently: TAKE 1 TABLET BY MOUTH EVERY DAY BEFORE BREAKFAST (0600)) 90 tablet 2  . magnesium hydroxide (MILK OF MAGNESIA) 400 MG/5ML suspension Take 30 mLs by mouth daily as needed for mild constipation (for constipation (IF NO BOWEL MOVEMENT IN 3 DAYS)).    [name] polyethylene glycol (MIRALAX / GLYCOLAX) packet Take 17 g by mouth daily. (Patient taking differently: Take 17 g by  mouth daily. (0900)) 14 each 0  . pregabalin (LYRICA) 75 MG capsule Take 75 mg by mouth 2 (two) times daily.    . primidone (MYSOLINE) 50 MG tablet 150 mg in the morning, 100 mg in the evening (Patient taking differently: Take 100-150 mg by mouth 2 (two) times daily. (0900 & 1800) 150 mg in the morning, 100 mg in the evening) 450 tablet 0  . ranitidine (ZANTAC) 150 MG tablet TAKE 1 TABLET BY MOUTH 2 TIMES DAILY (Patient taking differently: TAKE 1 TABLET BY MOUTH 2 TIMES DAILY (0600 & 1630)) 180 tablet 1  . rosuvastatin (CRESTOR) 10 MG tablet Take  half (1/2) tablet (5 mg) by mouth daily. 45 tablet 1  . Sodium Phosphates (RA SALINE ENEMA) 19-7 GM/118ML ENEM Place 1 Bottle rectally daily as needed (for constipation not relieved by bisacodyl suppository).    [name] tiZANidine (ZANAFLEX) 4 MG tablet Take 1 tablet (4 mg total) by mouth every 8 (eight) hours as needed for muscle spasms. 60 tablet 0  . Turmeric 500 MG CAPS Take 500 mg by mouth 2 (two) times daily. (0900 & 2100)    . vitamin B-12 (CYANOCOBALAMIN) 1000 MCG tablet Take 1,000 mcg by mouth 2 (two) times daily. (0900 & 2100)     No current facility-administered medications for this visit.     Allergies  Allergen Reactions  . Benzodiazepines Other (See Comments)    Patient states he gets stroke symptoms with benzo's.   . Coreg [Carvedilol] Other (See Comments)    Accumulates and causes stroke-like side-effects due to 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 Patient poor metabolizer of CYP2D6 - Coreg undergoes extensive hepatic (including 2D6)  . Lisinopril Other (See Comments)    Hyperkalemia. 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  . Mobic [Meloxicam] Other (See Comments)    Accumulates and causes stroke-like side-effects due to 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  . Sertraline Hcl Other (See Comments)    Accumulates and causes stroke-like side-effects due to 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  . Tramadol Other (See Comments)    Accumulates and causes stroke-like side-effects due to 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  . Atorvastatin Other (See Comments)    MYALGIAS  . Colchicine Other (See Comments)    ataxia  . Compazine [Prochlorperazine Maleate] Other (See Comments)    Accumulates and causes stroke-like side-effects due to 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  . Flomax [Tamsulosin Hcl]     Accumulates and causes stroke-like side-effects due to 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  . Ondansetron Other (See Comments)    Accumulates and causes  stroke-like side-effects due to 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   . Promethazine Other (See Comments)    Accumulates and causes stroke-like side-effects due to 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  . Acyclovir And Related Other (See Comments)    Accumulates and causes stroke-like side-effects due to 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    Social History   Socioeconomic History  . Marital status: Single    Spouse name: Karmen Bongo, RN  . Number of children: 2  . Years of education: Not on file  . Highest education level: Not on file  Social Needs  . Financial resource strain: Not on file  . Food insecurity - worry: Not on file  . Food insecurity - inability: Not on file  . Transportation needs - medical: Not on file  . Transportation needs - non-medical: Not on file  Occupational History  . Occupation: Retired Animal nutritionist  Tobacco Use  . Smoking  status: Former Smoker    Packs/day: 1.00    Years: 34.00    Pack years: 34.00    Types: Cigarettes    Last attempt to quit: 06/06/1990    Years since quitting: 26.9  . Smokeless tobacco: Never Used  . Tobacco comment: widowed 1991 - lives with SO [name] who is RN  Substance and Sexual Activity  . Alcohol use: Yes    Alcohol/week: 0.6 oz    Types: 1 Glasses of wine per week    Comment: occasional  . Drug use: No  . Sexual activity: Yes  Other Topics Concern  . Not on file  Social History Narrative  . Not on file     Review of Systems: As noted in HPI All other systems reviewed and are otherwise negative except as noted above.    Blood pressure (!) 151/76, pulse 70, height 5\' 8"  (1.727 m), weight 189 lb (85.7 kg), SpO2 98 %.  GENERAL:  Well appearing HEENT:  PERRL, EOMI, sclera are clear. Oropharynx is clear. NECK:  No jugular venous distention, carotid upstroke brisk with loud left carotid bruit and softer left subclavian bruit, no thyromegaly or adenopathy LUNGS:  Clear to auscultation bilaterally CHEST:  Keloid  in sternal incision. Tender to palpation. HEART:  RRR,  PMI not displaced or sustained,S1 and S2 within normal limits, no S3, no S4: no clicks, no rubs, soft SEM RUSB. ABD:  Soft, nontender. BS +, no masses or bruits. No hepatomegaly, no splenomegaly EXT:  2 + pulses throughout, no edema, no cyanosis no clubbing. Left foot in hard boot.  SKIN:  Warm and dry.  No rashes NEURO:  Alert and oriented x 3. Cranial nerves II through XII intact. PSYCH:  Cognitively intact  Lab Results  Component Value Date   WBC 10.2 03/19/2017   HGB 11.9 (L) 03/19/2017   HCT 36.9 (L) 03/19/2017   PLT 178 03/19/2017   GLUCOSE 105 (H) 03/19/2017   CHOL 126 10/26/2016   TRIG 89.0 10/26/2016   HDL 40.50 10/26/2016   LDLDIRECT 100.0 09/08/2014   LDLCALC 68 10/26/2016   ALT 17 10/26/2016   AST 14 10/26/2016   NA 134 (L) 03/19/2017   K 3.7 03/19/2017   CL 99 (L) 03/19/2017   CREATININE 0.80 03/19/2017   BUN 19 03/19/2017   CO2 29 03/19/2017   TSH 2.96 10/26/2016   PSA 1.48 10/26/2016   INR 1.47 03/30/2015   HGBA1C 5.9 10/26/2016   MICROALBUR 1.5 10/26/2016     EKG- today. NSR with first degree AV block. Otherwise normal. I have personally reviewed and interpreted this study.   Echo 04/24/15: Study Conclusions  - Left ventricle: The cavity size was normal. Wall thickness was   increased in a pattern of mild LVH. Systolic function was normal.   The estimated ejection fraction was in the range of 55% to 60%.   Wall motion was normal; there were no regional wall motion   abnormalities. Doppler parameters are consistent with abnormal   left ventricular relaxation (grade 1 diastolic dysfunction). - Aortic valve: A bioprosthesis was present. There was trivial   regurgitation. - Left atrium: The atrium was mildly dilated. - Atrial septum: There was an atrial septal aneurysm. - Pericardium, extracardiac: A small pericardial effusion was   identified.  Impressions:  - Normal LV systolic function;  grade 1 diastolic dysfunction; mild   LVH; s/p AVR with trace AI and mean gradient of 18 mmHg; mild   LAE; trace MR.  Myoview  10/22/16: Study Highlights     The left ventricular ejection fraction is mildly decreased (45-54%).  Nuclear stress EF: 53%.  No T wave inversion was noted during stress.  There was no ST segment deviation noted during stress.  This is a low risk study.   No reversible ischemia. LVEF 53% with normal wall motion. This is a low risk study.      ASSESSMENT AND PLAN:  1.  CAD s/p CABG in October 2016 following NSTEMI.   Not a candidate for beta blocker due to brady. Myoview negative in May 2018. Continue statin. 3. S/p bioprosthetic AVR for aortic stenosis. Reminded him of routine SBE prophylaxis. Normal valve sound on exam. 3. Afib. Resolved. 4. Hyperlipidemia. Satisfactory on Crestor 5 mg daily 5. HTN. Elevated today but has better readings at home. Still borderline. Will consult with pharmacy what options we have for BP control given metabolic issues.  6. Sternal scar keloid and pain- chronic.    Kyera Felan [name] MD,FACC 05/01/2017 9:13 AM

## 2017-05-01 ENCOUNTER — Ambulatory Visit (INDEPENDENT_AMBULATORY_CARE_PROVIDER_SITE_OTHER): Payer: Medicare Other | Admitting: Cardiology

## 2017-05-01 ENCOUNTER — Encounter: Payer: Self-pay | Admitting: Cardiology

## 2017-05-01 VITALS — BP 151/76 | HR 70 | Ht 68.0 in | Wt 189.0 lb

## 2017-05-01 DIAGNOSIS — I10 Essential (primary) hypertension: Secondary | ICD-10-CM | POA: Diagnosis not present

## 2017-05-01 DIAGNOSIS — E785 Hyperlipidemia, unspecified: Secondary | ICD-10-CM

## 2017-05-01 DIAGNOSIS — Z952 Presence of prosthetic heart valve: Secondary | ICD-10-CM

## 2017-05-01 DIAGNOSIS — I2510 Atherosclerotic heart disease of native coronary artery without angina pectoris: Secondary | ICD-10-CM | POA: Diagnosis not present

## 2017-05-01 DIAGNOSIS — Z951 Presence of aortocoronary bypass graft: Secondary | ICD-10-CM

## 2017-05-01 NOTE — Patient Instructions (Signed)
Continue your current therapy  I will see you in 6 months.   

## 2017-05-02 DIAGNOSIS — W11XXXD Fall on and from ladder, subsequent encounter: Secondary | ICD-10-CM | POA: Diagnosis not present

## 2017-05-02 DIAGNOSIS — Z7982 Long term (current) use of aspirin: Secondary | ICD-10-CM | POA: Diagnosis not present

## 2017-05-02 DIAGNOSIS — S82872D Displaced pilon fracture of left tibia, subsequent encounter for closed fracture with routine healing: Secondary | ICD-10-CM | POA: Diagnosis not present

## 2017-05-08 ENCOUNTER — Other Ambulatory Visit: Payer: Self-pay | Admitting: Neurology

## 2017-05-08 ENCOUNTER — Other Ambulatory Visit: Payer: Self-pay | Admitting: Internal Medicine

## 2017-05-09 DIAGNOSIS — S82872D Displaced pilon fracture of left tibia, subsequent encounter for closed fracture with routine healing: Secondary | ICD-10-CM | POA: Diagnosis not present

## 2017-05-09 DIAGNOSIS — Z7982 Long term (current) use of aspirin: Secondary | ICD-10-CM | POA: Diagnosis not present

## 2017-05-09 DIAGNOSIS — W11XXXD Fall on and from ladder, subsequent encounter: Secondary | ICD-10-CM | POA: Diagnosis not present

## 2017-05-10 DIAGNOSIS — S32028D Other fracture of second lumbar vertebra, subsequent encounter for fracture with routine healing: Secondary | ICD-10-CM | POA: Diagnosis not present

## 2017-05-19 DIAGNOSIS — S82872D Displaced pilon fracture of left tibia, subsequent encounter for closed fracture with routine healing: Secondary | ICD-10-CM | POA: Diagnosis not present

## 2017-05-19 DIAGNOSIS — W11XXXD Fall on and from ladder, subsequent encounter: Secondary | ICD-10-CM | POA: Diagnosis not present

## 2017-05-19 DIAGNOSIS — Z7982 Long term (current) use of aspirin: Secondary | ICD-10-CM | POA: Diagnosis not present

## 2017-05-26 DIAGNOSIS — W11XXXD Fall on and from ladder, subsequent encounter: Secondary | ICD-10-CM | POA: Diagnosis not present

## 2017-05-26 DIAGNOSIS — Z7982 Long term (current) use of aspirin: Secondary | ICD-10-CM | POA: Diagnosis not present

## 2017-05-26 DIAGNOSIS — S82872D Displaced pilon fracture of left tibia, subsequent encounter for closed fracture with routine healing: Secondary | ICD-10-CM | POA: Diagnosis not present

## 2017-05-31 ENCOUNTER — Ambulatory Visit (INDEPENDENT_AMBULATORY_CARE_PROVIDER_SITE_OTHER): Payer: Medicare Other | Admitting: Podiatry

## 2017-05-31 ENCOUNTER — Encounter: Payer: Self-pay | Admitting: Podiatry

## 2017-05-31 DIAGNOSIS — M79674 Pain in right toe(s): Secondary | ICD-10-CM

## 2017-05-31 DIAGNOSIS — B351 Tinea unguium: Secondary | ICD-10-CM

## 2017-05-31 DIAGNOSIS — M79675 Pain in left toe(s): Secondary | ICD-10-CM | POA: Diagnosis not present

## 2017-05-31 DIAGNOSIS — I2510 Atherosclerotic heart disease of native coronary artery without angina pectoris: Secondary | ICD-10-CM

## 2017-05-31 NOTE — Progress Notes (Signed)
   Subjective:    Patient ID: Troy Banks, male    DOB: [birth year], 76 y.o.   MRN: [id number]  HPIthis patient presents the office for preventative foot care services and treatment of his toenails.  He says his nails haveinful walking and wearing his shoes.  Patient has recently had a closed fracture dislocation of his left ankle.  He also has a history of acute gout as well as  Cytochrome 450 disease. He  also says that he has been put on  on Lyrica f or the last 4 weeks .  He has also had a history of implant surgery in the big toe joint of his left foot, which failed and now there is a fusion  at the first MPJ of the left foot  .  He presents to the office today for preventative foot care services.    Review of Systems  All other systems reviewed and are negative.      Objective:   Physical Exam General Appearance  Alert, conversant and in no acute stress.  Vascular  Dorsalis pedis and posterior pulses are palpable  right.  Dorsalis pedis and posterior tibial pulses left foot are absent due to swelling .  [name]  Capillary return is within normal limits  bilaterally. Temperature is within normal limits  Bilaterally.  Neurologic  Senn-Weinstein monofilament wire test within normal limits  bilaterally. Muscle power within normal limits bilaterally.  Nails Thick disfigured discolored nails with subungual debris bilaterally from hallux to fifth toes bilaterally. No evidence of bacterial infection or drainage bilaterally.  Orthopedic  No limitations of motion of motion feet bilaterally.  No crepitus or effusions noted. !st MPJ left big toe joint.  Skin  normotropic skin with no porokeratosis noted bilaterally.  No signs of infections or ulcers noted.            Assessment & Plan:  Onychomycosis  B/L  IE  Debride nails  B/L.  RTC 3 months.   Gardiner Barefoot DPM

## 2017-06-13 DIAGNOSIS — S82872D Displaced pilon fracture of left tibia, subsequent encounter for closed fracture with routine healing: Secondary | ICD-10-CM | POA: Diagnosis not present

## 2017-06-15 ENCOUNTER — Other Ambulatory Visit: Payer: Self-pay | Admitting: Cardiology

## 2017-06-15 ENCOUNTER — Other Ambulatory Visit: Payer: Self-pay | Admitting: Internal Medicine

## 2017-06-20 ENCOUNTER — Ambulatory Visit: Payer: Medicare Other | Attending: Student

## 2017-06-20 DIAGNOSIS — M25672 Stiffness of left ankle, not elsewhere classified: Secondary | ICD-10-CM

## 2017-06-20 DIAGNOSIS — R600 Localized edema: Secondary | ICD-10-CM

## 2017-06-20 DIAGNOSIS — M25572 Pain in left ankle and joints of left foot: Secondary | ICD-10-CM | POA: Diagnosis not present

## 2017-06-20 DIAGNOSIS — R262 Difficulty in walking, not elsewhere classified: Secondary | ICD-10-CM | POA: Diagnosis not present

## 2017-06-20 NOTE — Therapy (Signed)
[place], [place], [address] Phone: [phone]   Fax:  [phone]  Physical Therapy Evaluation  Patient Details  Name: Troy Banks MRN: [id number] Date of Birth: 10-23-1940 Referring Provider: Katha Hamming, MD   Encounter Date: 06/20/2017  PT End of Session - 06/20/17 0849    Visit Number  1    Number of Visits  24    Date for PT Re-Evaluation  09/08/17    Authorization Type  MEDICARE    Authorization Time Period  KX at visit 15    PT Start Time  0845    PT Stop Time  0930    PT Time Calculation (min)  45 min    Activity Tolerance  Patient tolerated treatment well    Behavior During Therapy  [facility] for tasks assessed/performed       Past Medical History:  Diagnosis Date  . Allergy   . Anxiety    PTSD  . Aortic stenosis, moderate 03/27/2015   post AVR echo Echo 11/16: Mild LVH, EF 55-60%, normal wall motion, grade 1 diastolic dysfunction, bioprosthetic AVR okay (mean gradient 18 mmHg) with trivial AI, mild LAE, atrial septal aneurysm, small effusion   . Arthritis    "qwhere" (10/29/2015)  . Carotid artery stenosis    Carotid US 11/17: 1-39% bilateral ICA stenosis. F/u prn  . Cervical stenosis of spinal canal    fusion 2006  . CHF (congestive heart failure) ([place])    "secondry to OHS"  . Closed left pilon fracture   . COLONIC POLYPS, HX OF   . Complication of anesthesia    "takes a lot to put him under and has woken up during surgery" Difficult to wake up . PTSD; "does not metabolize RX well"Pt. reports that he gets "violent", per pt.   . Diabetes mellitus without complication (HCC)    diet controlled  . DIVERTICULOSIS, COLON   . Dysrhythmia    2 bouts of afib post op and at home but corrected with medication.  . Familial tremor 03/19/2014  . GERD (gastroesophageal reflux disease)    uses Zantac  . GOUT   . Headache   . Heart murmur   . High cholesterol   . HYPERTENSION    off ACEI  2010 because of hyperkalemia in setting of ARI  . Hypothyroidism    newly diaganosed 09/2015  . Kidney stones    remote open stone-retrieval on L  . Myocardial infarction [facility])    October 20th 2016  . OSA on CPAP   . 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 PTSD (post-traumatic stress disorder)   . Sleep apnea    On a CPAP    Past Surgical History:  Procedure Laterality Date  . ANKLE FUSION Left 03/17/2017   Procedure: FUSION  PILON FRACTURE WITH BONE GRAFTING;  Surgeon: Shona Needles, MD;  Location: [place];  Service: Orthopedics;  Laterality: Left;  . ANTERIOR FUSION CERVICAL SPINE  2002  . AORTIC VALVE REPLACEMENT N/A 03/30/2015   Procedure: AORTIC VALVE REPLACEMENT (AVR);  Surgeon: Grace Isaac, MD;  Location: [place];  Service: Open Heart Surgery;  Laterality: N/A;  . BACK SURGERY    . CARDIAC CATHETERIZATION N/A 03/26/2015   Procedure: Left Heart Cath and Coronary Angiography;  Surgeon: Leonie Man, MD;  Location: [place] CV [facility];  Service: Cardiovascular;  Laterality: N/A;  . CARDIAC VALVE  REPLACEMENT    . CORONARY ARTERY BYPASS GRAFT N/A 03/30/2015   Procedure: CORONARY ARTERY BYPASS GRAFTING (CABG) x four, using left internal mammary artery and left    leg greater saphenous vein harvested endoscopically ;  Surgeon: Grace Isaac, MD;  Location: [place];  Service: Open Heart Surgery;  Laterality: N/A;  . CYSTOSCOPY/URETEROSCOPY/HOLMIUM LASER/STENT PLACEMENT Bilateral 11/07/2016   Procedure: CYSTOSCOPY/URETEROSCOPY/HOLMIUM LASER/STENT PLACEMENT;  Surgeon: Nickie Retort, MD;  [facility];  Service: Urology;  Laterality: Bilateral;  . CYSTOSCOPY/URETEROSCOPY/HOLMIUM LASER/STENT PLACEMENT Bilateral 11/23/2016   Procedure: CYSTOSCOPY/BILATERAL URETEROSCOPY/HOLMIUM LASER/STENT EXCHANGE;  Surgeon: Nickie Retort, MD;  [facility];  Service: Urology;  Laterality: Bilateral;  NEEDS 90 MIN    . EXTERNAL FIXATION LEG Left 02/22/2017   Procedure: EXTERNAL FIXATION LEFT ANKLE;  Surgeon: Renette Butters, MD;  Location: [place];  Service: Orthopedics;  Laterality: Left;  . HEMORRHOID BANDING  1970s  . INGUINAL HERNIA REPAIR Left 1962  . JOINT REPLACEMENT    . [place]   "cut me open"  . POSTERIOR FUSION CERVICAL SPINE  2006  . TEE WITHOUT CARDIOVERSION N/A 03/30/2015   Procedure: TRANSESOPHAGEAL ECHOCARDIOGRAM (TEE);  Surgeon: Grace Isaac, MD;  Location: [place];  Service: Open Heart Surgery;  Laterality: N/A;  . TESTICLE TORSION REDUCTION Right 1960  . TOE FUSION Left 1985 & 2004   "great toe"  . TONSILLECTOMY    . TOTAL HIP ARTHROPLASTY Right 10/27/2015   Procedure: RIGHT TOTAL HIP ARTHROPLASTY ANTERIOR APPROACH and steroid injection right foot;  Surgeon: Mcarthur Rossetti, MD;  Location: [place];  Service: Orthopedics;  Laterality: Right;    There were no vitals filed for this visit.   Subjective Assessment - 06/20/17 0850    Subjective  Post Lt ankle fusion after fall from roof    Limitations  Standing;Walking;House hold activities    How long can you sit comfortably?  As needed    How long can you stand comfortably?  limited    How long can you walk comfortably?  houshold    Patient Stated Goals  Return to walking independently with device and return to motorcycle riding    Currently in Pain?  Yes    Pain Score  2     Pain Location  Ankle    Pain Orientation  Left    Pain Descriptors / Indicators  Throbbing;Aching    Pain Type  Chronic pain    Pain Onset  More than a month ago    Pain Frequency  Constant    Aggravating Factors   walking , weight    Pain Relieving Factors  rest and meds    Multiple Pain Sites  No         [facility] PT Assessment - 06/20/17 0001      Assessment   Medical Diagnosis  LT ankle fusion    Referring Provider  Katha Hamming, MD    Onset Date/Surgical Date  02/05/17 surgery 03/17/17    Prior Therapy  [facility]  last  seen 10 days ago, 8 visits      Precautions   Precaution Comments  WBAT in BOOT      Restrictions   Weight Bearing Restrictions  Yes    LLE Weight Bearing  Weight bearing as tolerated      Balance Screen   Has the patient fallen in the past 6 months  Yes    How many times?  1    Has the  patient had a decrease in activity level because of a fear of falling?   Yes    Is the patient reluctant to leave their home because of a fear of falling?   No      Home Environment   Living Environment  Private residence    Living Arrangements  Spouse/significant other    Type of [place] to enter    Entrance Stairs-Number of Steps  4    Entrance Stairs-Rails  Right;Left;Cannot reach both      Prior Function   Level of Independence  Requires assistive device for independence;Needs assistance with ADLs;Needs assistance with homemaking    Vocation  Retired      Associate Professor   Overall Cognitive Status  Within Functional Limits for tasks assessed      Observation/Other Assessments   Focus on Therapeutic Outcomes (FOTO)   54% limited      Observation/Other Assessments-Edema    Edema  Figure 8      Figure 8 Edema   Figure 8 - Right   57    Figure 8 - Left   60      ROM / Strength   AROM / PROM / Strength  AROM;PROM;Strength      AROM   Overall AROM Comments  LT great toe DF 22 degrees          RT ankle WNL    AROM Assessment Site  Ankle    Right/Left Ankle  Right;Left    Left Ankle Dorsiflexion  90    Left Ankle Plantar Flexion  105    Left Ankle Inversion  3    Left Ankle Eversion  3      PROM   PROM Assessment Site  Ankle    Right/Left Ankle  Left      Strength   Overall Strength Comments  LT not assessed due to lack of ROM    Strength Assessment Site  Ankle    Right/Left Ankle  Right;Left    Right Ankle Dorsiflexion  5/5    Right Ankle Plantar Flexion  5/5    Right Ankle Inversion  5/5    Right Ankle Eversion  5/5      Ambulation/Gait   Gait  Comments  RW light weight to LT leg in CAM walker             Objective measurements completed on examination: See above findings.              PT Education - 06/20/17 0900    Education provided  Yes    Education Details  POC     Person(s) Educated  Patient    Methods  Explanation    Comprehension  Verbalized understanding       PT Short Term Goals - 06/20/17 0927      PT SHORT TERM GOAL #1   Title  He will be independnet with initial HEP     Time  4    Period  Weeks    Status  New      PT SHORT TERM GOAL #2   Title  He will be able to walk WBAT with SPC in home    Time  4    Period  Weeks    Status  New      PT SHORT TERM GOAL #3   Title  Swelling improved to 59 cm    Time  4  Period  Weeks    Status  New        PT Long Term Goals - 06/20/17 0865      PT LONG TERM GOAL #1   Title  He will be independent with all HEP issued    Time  12    Period  Weeks    Status  New      PT LONG TERM GOAL #2   Title  He will be able to walk with least restricted device community distances    Time  12    Period  Weeks    Status  New      PT LONG TERM GOAL #3   Title  He will return to riding motorcycle if allowed by surgeon    Time  12    Period  Weeks    Status  New      PT LONG TERM GOAL #4   Title  He will report returning to all home tasks except more challenging activity like getting on roof.    Time  12    Period  Weeks    Status  New      PT LONG TERM GOAL #5   Title  swelling will improve to no more than 1 cm greater girth than RT    Time  12    Period  Weeks    Status  New      Additional Long Term Goals   Additional Long Term Goals  --      PT LONG TERM GOAL #6   Title  pain will be intermittant and no more than 1-2/10    Time  12    Period  Weeks    Status  New      PT LONG TERM GOAL #7   Title  FOTO score improved to 20% limited or less    Time  12    Period  Weeks    Status  New             Plan - 06/20/17  0924    Clinical Impression Statement  Mr Sevin presents post LT ankle fusion  post fall with injury. He has almost no active motion and significant swelling and WBAT walking with RW. He shoulddo well and proceed to walking with cane or less.     Clinical Presentation  Stable    Clinical Decision Making  Low    Rehab Potential  Good    PT Frequency  2x / week    PT Duration  12 weeks    PT Treatment/Interventions  Cryotherapy;Vasopneumatic Device;Therapeutic exercise;Therapeutic activities;Gait training;Patient/family education;Passive range of motion;Manual techniques;Taping    PT Next Visit Plan  Vaso , manual AROM , weigth bearing activity    Consulted and Agree with Plan of Care  Patient       Patient will benefit from skilled therapeutic intervention in order to improve the following deficits and impairments:  Pain, Decreased activity tolerance, Decreased balance, Decreased range of motion, Decreased strength, Increased edema, Difficulty walking  Visit Diagnosis: Pain in left ankle and joints of left foot  Stiffness of left ankle, not elsewhere classified  Localized edema     Problem List Patient Active Problem List   Diagnosis Date Noted  . Pilon fracture 03/17/2017  . Pilon fracture of left tibia 03/17/2017  . At risk for adverse drug reaction 02/28/2017  . Closed fracture dislocation of ankle joint, left, with delayed healing, subsequent encounter 02/22/2017  .  Renal stone 10/26/2016  . Gross hematuria 10/26/2016  . Bladder neck obstruction 10/26/2016  . External hemorrhoid, thrombosed 06/10/2016  . Internal hemorrhoids 06/10/2016  . Lumbar back pain 05/19/2016  . Osteoarthritis of right hip 10/27/2015  . Status post total replacement of right hip 10/27/2015  . Chronic chest pain 09/22/2015  . Acute gouty arthritis 06/11/2015  . CAD (coronary artery disease), native coronary artery 06/11/2015  . Chronic diastolic CHF (congestive heart failure) ([place]) 06/11/2015   . H/O tissue AVR Oct 2016 06/10/2015  . PAF post CABG/AVR- Amiodarone 06/10/2015  . Dyslipidemia 06/10/2015  . S/P CABG x 4-Oct 2016 03/30/2015  . H/O NSTEMI-Oct 2016 03/27/2015  . 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 Depression 09/08/2014  . GERD (gastroesophageal reflux disease)   . Essential tremor 03/31/2014  . Iliotibial band syndrome affecting left lower leg 01/01/2014  . Spondylolisthesis at L4-L5 level 01/01/2014  . Sinus bradycardia 12/31/2013  . Greater trochanteric bursitis of right hip 12/04/2013  . OSA on CPAP   . Diet-controlled diabetes mellitus ([place]) 07/08/2009  . COLONIC POLYPS, HX OF 12/31/2008  . GOUT 12/22/2008  . Essential hypertension 12/22/2008  . Diverticulosis of colon 12/22/2008  . NEPHROLITHIASIS, HX OF 12/22/2008    Darrel Hoover  PT 06/20/2017, 9:59 AM  [facility] [address] [place], [place], [address] Phone: [phone]   Fax:  [phone]  Name: Troy Banks MRN: [id number] Date of Birth: [birth year]

## 2017-06-22 ENCOUNTER — Encounter: Payer: Self-pay | Admitting: Physical Therapy

## 2017-06-22 ENCOUNTER — Ambulatory Visit: Payer: Medicare Other | Admitting: Physical Therapy

## 2017-06-22 DIAGNOSIS — M25672 Stiffness of left ankle, not elsewhere classified: Secondary | ICD-10-CM | POA: Diagnosis not present

## 2017-06-22 DIAGNOSIS — R262 Difficulty in walking, not elsewhere classified: Secondary | ICD-10-CM

## 2017-06-22 DIAGNOSIS — M25572 Pain in left ankle and joints of left foot: Secondary | ICD-10-CM | POA: Diagnosis not present

## 2017-06-22 DIAGNOSIS — R600 Localized edema: Secondary | ICD-10-CM

## 2017-06-22 NOTE — Therapy (Signed)
[place], [place], [address] Phone: [phone]   Fax:  [phone]  Physical Therapy Treatment  Patient Details  Name: Troy Banks MRN: [id number] Date of Birth: 1941/01/05 Referring Provider: Katha Hamming, MD   Encounter Date: 06/22/2017  PT End of Session - 06/22/17 1149    Visit Number  2    Number of Visits  24    Date for PT Re-Evaluation  09/08/17    PT Start Time  1103    PT Stop Time  1155    PT Time Calculation (min)  52 min    Activity Tolerance  Patient tolerated treatment well    Behavior During Therapy  [facility] for tasks assessed/performed       Past Medical History:  Diagnosis Date  . Allergy   . Anxiety    PTSD  . Aortic stenosis, moderate 03/27/2015   post AVR echo Echo 11/16: Mild LVH, EF 55-60%, normal wall motion, grade 1 diastolic dysfunction, bioprosthetic AVR okay (mean gradient 18 mmHg) with trivial AI, mild LAE, atrial septal aneurysm, small effusion   . Arthritis    "qwhere" (10/29/2015)  . Carotid artery stenosis    Carotid US 11/17: 1-39% bilateral ICA stenosis. F/u prn  . Cervical stenosis of spinal canal    fusion 2006  . CHF (congestive heart failure) ([place])    "secondry to OHS"  . Closed left pilon fracture   . COLONIC POLYPS, HX OF   . Complication of anesthesia    "takes a lot to put him under and has woken up during surgery" Difficult to wake up . PTSD; "does not metabolize RX well"Pt. reports that he gets "violent", per pt.   . Diabetes mellitus without complication (HCC)    diet controlled  . DIVERTICULOSIS, COLON   . Dysrhythmia    2 bouts of afib post op and at home but corrected with medication.  . Familial tremor 03/19/2014  . GERD (gastroesophageal reflux disease)    uses Zantac  . GOUT   . Headache   . Heart murmur   . High cholesterol   . HYPERTENSION    off ACEI 2010 because of hyperkalemia in setting of ARI  . Hypothyroidism    newly  diaganosed 09/2015  . Kidney stones    remote open stone-retrieval on L  . Myocardial infarction [facility])    October 20th 2016  . OSA on CPAP   . 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 PTSD (post-traumatic stress disorder)   . Sleep apnea    On a CPAP    Past Surgical History:  Procedure Laterality Date  . ANKLE FUSION Left 03/17/2017   Procedure: FUSION  PILON FRACTURE WITH BONE GRAFTING;  Surgeon: Shona Needles, MD;  Location: [place];  Service: Orthopedics;  Laterality: Left;  . ANTERIOR FUSION CERVICAL SPINE  2002  . AORTIC VALVE REPLACEMENT N/A 03/30/2015   Procedure: AORTIC VALVE REPLACEMENT (AVR);  Surgeon: Grace Isaac, MD;  Location: [place];  Service: Open Heart Surgery;  Laterality: N/A;  . BACK SURGERY    . CARDIAC CATHETERIZATION N/A 03/26/2015   Procedure: Left Heart Cath and Coronary Angiography;  Surgeon: Leonie Man, MD;  Location: [place] [facility];  Service: Cardiovascular;  Laterality: N/A;  . CARDIAC VALVE REPLACEMENT    . CORONARY ARTERY BYPASS GRAFT N/A 03/30/2015   Procedure: CORONARY ARTERY BYPASS GRAFTING (  CABG) x four, using left internal mammary artery and left    leg greater saphenous vein harvested endoscopically ;  Surgeon: Grace Isaac, MD;  Location: [place];  Service: Open Heart Surgery;  Laterality: N/A;  . CYSTOSCOPY/URETEROSCOPY/HOLMIUM LASER/STENT PLACEMENT Bilateral 11/07/2016   Procedure: CYSTOSCOPY/URETEROSCOPY/HOLMIUM LASER/STENT PLACEMENT;  Surgeon: Nickie Retort, MD;  [facility];  Service: Urology;  Laterality: Bilateral;  . CYSTOSCOPY/URETEROSCOPY/HOLMIUM LASER/STENT PLACEMENT Bilateral 11/23/2016   Procedure: CYSTOSCOPY/BILATERAL URETEROSCOPY/HOLMIUM LASER/STENT EXCHANGE;  Surgeon: Nickie Retort, MD;  [facility];  Service: Urology;  Laterality: Bilateral;  NEEDS 90 MIN   . EXTERNAL FIXATION LEG Left 02/22/2017   Procedure: EXTERNAL FIXATION LEFT  ANKLE;  Surgeon: Renette Butters, MD;  Location: [place];  Service: Orthopedics;  Laterality: Left;  . HEMORRHOID BANDING  1970s  . INGUINAL HERNIA REPAIR Left 1962  . JOINT REPLACEMENT    . [place]   "cut me open"  . POSTERIOR FUSION CERVICAL SPINE  2006  . TEE WITHOUT CARDIOVERSION N/A 03/30/2015   Procedure: TRANSESOPHAGEAL ECHOCARDIOGRAM (TEE);  Surgeon: Grace Isaac, MD;  Location: [place];  Service: Open Heart Surgery;  Laterality: N/A;  . TESTICLE TORSION REDUCTION Right 1960  . TOE FUSION Left 1985 & 2004   "great toe"  . TONSILLECTOMY    . TOTAL HIP ARTHROPLASTY Right 10/27/2015   Procedure: RIGHT TOTAL HIP ARTHROPLASTY ANTERIOR APPROACH and steroid injection right foot;  Surgeon: Mcarthur Rossetti, MD;  Location: [place];  Service: Orthopedics;  Laterality: Right;    There were no vitals filed for this visit.  Subjective Assessment - 06/22/17 1142    Subjective  No changes noted yet.      Currently in Pain?  Yes    Pain Score  2     Pain Orientation  Left         [facility] PT Assessment - 06/22/17 0001      Figure 8 Edema   Figure 8 - Left   61 cm                  [facility] Adult PT Treatment/Exercise - 06/22/17 0001      Vasopneumatic   Number Minutes Vasopneumatic   15 minutes    Vasopnuematic Location   Ankle    Vasopneumatic Pressure  Medium    Vasopneumatic Temperature   34      Manual Therapy   Manual Therapy  Edema management    Manual therapy comments  tissue softened bottom of foot,  and dorsum just superior to great toe.  dorsum continues to be stiff.  lymph sysyem vacume activation starting with deep breaths.  and working down .. care taken to avoid manual to calf.  Taught patient how to do.               PT Education - 06/22/17 1148    Education provided  Yes    Education Details  edema decrease how to    [employer]) Educated  Patient    Methods  Explanation;Demonstration;Verbal cues    Comprehension  Verbalized  understanding       PT Short Term Goals - 06/20/17 0927      PT SHORT TERM GOAL #1   Title  He will be independnet with initial HEP     Time  4    Period  Weeks    Status  New      PT SHORT TERM GOAL #2   Title  He will be able to walk WBAT with SPC in home    Time  4    Period  Weeks    Status  New      PT SHORT TERM GOAL #3   Title  Swelling improved to 59 cm    Time  4    Period  Weeks    Status  New        PT Long Term Goals - 06/20/17 4008      PT LONG TERM GOAL #1   Title  He will be independent with all HEP issued    Time  12    Period  Weeks    Status  New      PT LONG TERM GOAL #2   Title  He will be able to walk with least restricted device community distances    Time  12    Period  Weeks    Status  New      PT LONG TERM GOAL #3   Title  He will return to riding motorcycle if allowed by surgeon    Time  12    Period  Weeks    Status  New      PT LONG TERM GOAL #4   Title  He will report returning to all home tasks except more challenging activity like getting on roof.    Time  12    Period  Weeks    Status  New      PT LONG TERM GOAL #5   Title  swelling will improve to no more than 1 cm greater girth than RT    Time  12    Period  Weeks    Status  New      Additional Long Term Goals   Additional Long Term Goals  --      PT LONG TERM GOAL #6   Title  pain will be intermittant and no more than 1-2/10    Time  12    Period  Weeks    Status  New      PT LONG TERM GOAL #7   Title  FOTO score improved to 20% limited or less    Time  12    Period  Weeks    Status  New            Plan - 06/22/17 1149    Clinical Impression Statement  Figure 8 61 cm.  Decreasing edema focus today.  No pain increase with session    PT Next Visit Plan  Vaso , manual AROM , weigth bearing activity    Consulted and Agree with Plan of Care  Patient       Patient will benefit from skilled therapeutic intervention in order to improve the following  deficits and impairments:     Visit Diagnosis: Pain in left ankle and joints of left foot  Stiffness of left ankle, not elsewhere classified  Localized edema  Difficulty in walking, not elsewhere classified     Problem List Patient Active Problem List   Diagnosis Date Noted  . Pilon fracture 03/17/2017  . Pilon fracture of left tibia 03/17/2017  . At risk for adverse drug reaction 02/28/2017  . Closed fracture dislocation of ankle joint, left, with delayed healing, subsequent encounter 02/22/2017  . Renal stone 10/26/2016  . Gross hematuria 10/26/2016  . Bladder neck obstruction 10/26/2016  . External hemorrhoid, thrombosed 06/10/2016  . Internal hemorrhoids 06/10/2016  . Lumbar  back pain 05/19/2016  . Osteoarthritis of right hip 10/27/2015  . Status post total replacement of right hip 10/27/2015  . Chronic chest pain 09/22/2015  . Acute gouty arthritis 06/11/2015  . CAD (coronary artery disease), native coronary artery 06/11/2015  . Chronic diastolic CHF (congestive heart failure) ([place]) 06/11/2015  . H/O tissue AVR Oct 2016 06/10/2015  . PAF post CABG/AVR- Amiodarone 06/10/2015  . Dyslipidemia 06/10/2015  . S/P CABG x 4-Oct 2016 03/30/2015  . H/O NSTEMI-Oct 2016 03/27/2015  . 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 Depression 09/08/2014  . GERD (gastroesophageal reflux disease)   . Essential tremor 03/31/2014  . Iliotibial band syndrome affecting left lower leg 01/01/2014  . Spondylolisthesis at L4-L5 level 01/01/2014  . Sinus bradycardia 12/31/2013  . Greater trochanteric bursitis of right hip 12/04/2013  . OSA on CPAP   . Diet-controlled diabetes mellitus ([place]) 07/08/2009  . COLONIC POLYPS, HX OF 12/31/2008  . GOUT 12/22/2008  . Essential hypertension 12/22/2008  . Diverticulosis of colon 12/22/2008  . NEPHROLITHIASIS, HX OF 12/22/2008    Albert Devaul PTA 06/22/2017, 11:51 AM  [facility] [address] [place], [place], [address] Phone: [phone]   Fax:  [phone]  Name: TREVEN HOLTMAN MRN: [id number] Date of Birth: Dec 08, 1940

## 2017-06-27 ENCOUNTER — Encounter: Payer: Self-pay | Admitting: Physical Therapy

## 2017-06-27 ENCOUNTER — Ambulatory Visit: Payer: Medicare Other | Admitting: Physical Therapy

## 2017-06-27 DIAGNOSIS — R600 Localized edema: Secondary | ICD-10-CM | POA: Diagnosis not present

## 2017-06-27 DIAGNOSIS — M25672 Stiffness of left ankle, not elsewhere classified: Secondary | ICD-10-CM | POA: Diagnosis not present

## 2017-06-27 DIAGNOSIS — R262 Difficulty in walking, not elsewhere classified: Secondary | ICD-10-CM

## 2017-06-27 DIAGNOSIS — M25572 Pain in left ankle and joints of left foot: Secondary | ICD-10-CM

## 2017-06-27 NOTE — Therapy (Signed)
[place], [place], [address] Phone: [phone]   Fax:  [phone]  Physical Therapy Treatment  Patient Details  Name: Troy Banks MRN: [id number] Date of Birth: 11-29-1940 Referring Provider: Katha Hamming, MD   Encounter Date: 06/27/2017  PT End of Session - 06/27/17 1428    Visit Number  3    Number of Visits  24    Date for PT Re-Evaluation  09/08/17    PT Start Time  2409    PT Stop Time  1415    PT Time Calculation (min)  42 min    Activity Tolerance  Patient tolerated treatment well    Behavior During Therapy  [facility] for tasks assessed/performed       Past Medical History:  Diagnosis Date  . Allergy   . Anxiety    PTSD  . Aortic stenosis, moderate 03/27/2015   post AVR echo Echo 11/16: Mild LVH, EF 55-60%, normal wall motion, grade 1 diastolic dysfunction, bioprosthetic AVR okay (mean gradient 18 mmHg) with trivial AI, mild LAE, atrial septal aneurysm, small effusion   . Arthritis    "qwhere" (10/29/2015)  . Carotid artery stenosis    Carotid US 11/17: 1-39% bilateral ICA stenosis. F/u prn  . Cervical stenosis of spinal canal    fusion 2006  . CHF (congestive heart failure) ([place])    "secondry to OHS"  . Closed left pilon fracture   . COLONIC POLYPS, HX OF   . Complication of anesthesia    "takes a lot to put him under and has woken up during surgery" Difficult to wake up . PTSD; "does not metabolize RX well"Pt. reports that he gets "violent", per pt.   . Diabetes mellitus without complication (HCC)    diet controlled  . DIVERTICULOSIS, COLON   . Dysrhythmia    2 bouts of afib post op and at home but corrected with medication.  . Familial tremor 03/19/2014  . GERD (gastroesophageal reflux disease)    uses Zantac  . GOUT   . Headache   . Heart murmur   . High cholesterol   . HYPERTENSION    off ACEI 2010 because of hyperkalemia in setting of ARI  . Hypothyroidism    newly  diaganosed 09/2015  . Kidney stones    remote open stone-retrieval on L  . Myocardial infarction [facility])    October 20th 2016  . OSA on CPAP   . 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 PTSD (post-traumatic stress disorder)   . Sleep apnea    On a CPAP    Past Surgical History:  Procedure Laterality Date  . ANKLE FUSION Left 03/17/2017   Procedure: FUSION  PILON FRACTURE WITH BONE GRAFTING;  Surgeon: Shona Needles, MD;  Location: [place];  Service: Orthopedics;  Laterality: Left;  . ANTERIOR FUSION CERVICAL SPINE  2002  . AORTIC VALVE REPLACEMENT N/A 03/30/2015   Procedure: AORTIC VALVE REPLACEMENT (AVR);  Surgeon: Grace Isaac, MD;  Location: [place];  Service: Open Heart Surgery;  Laterality: N/A;  . BACK SURGERY    . CARDIAC CATHETERIZATION N/A 03/26/2015   Procedure: Left Heart Cath and Coronary Angiography;  Surgeon: Leonie Man, MD;  Location: [place] CV LAB;  Service: Cardiovascular;  Laterality: N/A;  . CARDIAC VALVE REPLACEMENT    . CORONARY ARTERY BYPASS GRAFT N/A 03/30/2015   Procedure: CORONARY ARTERY BYPASS GRAFTING (  CABG) x four, using left internal mammary artery and left    leg greater saphenous vein harvested endoscopically ;  Surgeon: Grace Isaac, MD;  Location: [place];  Service: Open Heart Surgery;  Laterality: N/A;  . CYSTOSCOPY/URETEROSCOPY/HOLMIUM LASER/STENT PLACEMENT Bilateral 11/07/2016   Procedure: CYSTOSCOPY/URETEROSCOPY/HOLMIUM LASER/STENT PLACEMENT;  Surgeon: Nickie Retort, MD;  [facility];  Service: Urology;  Laterality: Bilateral;  . CYSTOSCOPY/URETEROSCOPY/HOLMIUM LASER/STENT PLACEMENT Bilateral 11/23/2016   Procedure: CYSTOSCOPY/BILATERAL URETEROSCOPY/HOLMIUM LASER/STENT EXCHANGE;  Surgeon: Nickie Retort, MD;  [facility];  Service: Urology;  Laterality: Bilateral;  NEEDS 90 MIN   . EXTERNAL FIXATION LEG Left 02/22/2017   Procedure: EXTERNAL FIXATION LEFT  ANKLE;  Surgeon: Renette Butters, MD;  Location[facility] [place];  Service: Orthopedics;  Laterality: Left;  . HEMORRHOID BANDING  1970s  . INGUINAL HERNIA REPAIR Left 1962  . JOINT REPLACEMENT    . [place]   "cut me open"  . POSTERIOR FUSION CERVICAL SPINE  2006  . TEE WITHOUT CARDIOVERSION N/A 03/30/2015   Procedure: TRANSESOPHAGEAL ECHOCARDIOGRAM (TEE);  Surgeon: Grace Isaac, MD;  Location: [place];  Service: Open Heart Surgery;  Laterality: N/A;  . TESTICLE TORSION REDUCTION Right 1960  . TOE FUSION Left 1985 & 2004   "great toe"  . TONSILLECTOMY    . TOTAL HIP ARTHROPLASTY Right 10/27/2015   Procedure: RIGHT TOTAL HIP ARTHROPLASTY ANTERIOR APPROACH and steroid injection right foot;  Surgeon: Mcarthur Rossetti, MD;  Location: [place];  Service: Orthopedics;  Laterality: Right;    There were no vitals filed for this visit.  Subjective Assessment - 06/27/17 1345    Subjective  I was able to feel cold in my foot for the first time,  I am putting about 20 LBs weight through foot.  Less pain with walking today,  toes are moving more    Currently in Pain?  Yes    Pain Score  2     Pain Location  Ankle    Pain Orientation  Left;Lateral;Anterior    Pain Descriptors / Indicators  Aching;Throbbing    Pain Type  Chronic pain    Pain Frequency  Constant    Aggravating Factors   walking,  weight    Pain Relieving Factors  rest meds,  elevation.    Effect of Pain on Daily Activities  walker 100%                      [facility] Adult PT Treatment/Exercise - 06/27/17 0001      Ambulation/Gait   Pre-Gait Activities  tested weight bearing with scale.  he is weightbearing 80 LBS .   with 2-3/10 pain.    Gait Comments  35 feet with walker.  patient able to achieve heel toe gait with minimally shortened step lenght when compared to right.  Weightbearing  with walking noted through his hands.       Exercises   Exercises  --      Manual Therapy   Manual Therapy   Edema management ROM toes    Manual therapy comments  tissue softened distal leg,  swelling visably decreased.      Edema Management  retrograde soft tissue work,  lymph system vacume activation.   HX of gout in great toe.  Patient thinks it is fused.      Ankle Exercises: Seated   Other Seated Ankle Exercises  toe curla and uncurls multiple reps  Ankle Exercises: Stretches   Other Stretch  toe stretch with toe on floor,  multiple reps.  cued to decrease pain with keeping ankle stiff.              PT Education - 06/27/17 1427    Education provided  Yes    Education Details  gait training and benifits of weightbearing (When able)    Person(s) Educated  Patient    Methods  Explanation    Comprehension  Verbalized understanding       PT Short Term Goals - 06/27/17 1439      PT SHORT TERM GOAL #1   Title  He will be independnet with initial HEP     Baseline  Noe exercises formally eissued yet.  He is curling and uncurling his toes.    Time  4    Period  Weeks    Status  On-going      PT SHORT TERM GOAL #2   Title  He will be able to walk WBAT with SPC in home    Baseline  walker 100%    Time  4    Period  Weeks    Status  On-going      PT SHORT TERM GOAL #3   Title  Swelling improved to 59 cm    Baseline  58.5 cm    Time  4    Period  Weeks    Status  Achieved        PT Long Term Goals - 06/20/17 5784      PT LONG TERM GOAL #1   Title  He will be independent with all HEP issued    Time  12    Period  Weeks    Status  New      PT LONG TERM GOAL #2   Title  He will be able to walk with least restricted device community distances    Time  12    Period  Weeks    Status  New      PT LONG TERM GOAL #3   Title  He will return to riding motorcycle if allowed by surgeon    Time  12    Period  Weeks    Status  New      PT LONG TERM GOAL #4   Title  He will report returning to all home tasks except more challenging activity like getting on roof.    Time   12    Period  Weeks    Status  New      PT LONG TERM GOAL #5   Title  swelling will improve to no more than 1 cm greater girth than RT    Time  12    Period  Weeks    Status  New      Additional Long Term Goals   Additional Long Term Goals  --      PT LONG TERM GOAL #6   Title  pain will be intermittant and no more than 1-2/10    Time  12    Period  Weeks    Status  New      PT LONG TERM GOAL #7   Title  FOTO score improved to 20% limited or less    Time  12    Period  Weeks    Status  New            Plan - 06/27/17 1428  Clinical Impression Statement  Figure 8 :  58.5 cm. STG#3 met. Decreasing edema and toe ROM and gait focus of today's session.  2/10 pain noted at end of session in ankle.  Edema continues to visably  improve when compared to his first arrived today.      PT Next Visit Plan  Vaso , manual AROM , weigth bearing activity.  HEP?  toe curls,  sitting toe stretching?Consider hip 4 way.  1 legged Bike??/ Do not bring up the [employer].  See Santiago Glad.   PT Home Exercise Plan  edema management.      Consulted and Agree with Plan of Care  Patient       Patient will benefit from skilled therapeutic intervention in order to improve the following deficits and impairments:     Visit Diagnosis: Pain in left ankle and joints of left foot  Stiffness of left ankle, not elsewhere classified  Localized edema  Difficulty in walking, not elsewhere classified     Problem List Patient Active Problem List   Diagnosis Date Noted  . Pilon fracture 03/17/2017  . Pilon fracture of left tibia 03/17/2017  . At risk for adverse drug reaction 02/28/2017  . Closed fracture dislocation of ankle joint, left, with delayed healing, subsequent encounter 02/22/2017  . Renal stone 10/26/2016  . Gross hematuria 10/26/2016  . Bladder neck obstruction 10/26/2016  . External hemorrhoid, thrombosed 06/10/2016  . Internal hemorrhoids 06/10/2016  . Lumbar back pain 05/19/2016  .  Osteoarthritis of right hip 10/27/2015  . Status post total replacement of right hip 10/27/2015  . Chronic chest pain 09/22/2015  . Acute gouty arthritis 06/11/2015  . CAD (coronary artery disease), native coronary artery 06/11/2015  . Chronic diastolic CHF (congestive heart failure) ([place]) 06/11/2015  . H/O tissue AVR Oct 2016 06/10/2015  . PAF post CABG/AVR- Amiodarone 06/10/2015  . Dyslipidemia 06/10/2015  . S/P CABG x 4-Oct 2016 03/30/2015  . H/O NSTEMI-Oct 2016 03/27/2015  . 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 Depression 09/08/2014  . GERD (gastroesophageal reflux disease)   . Essential tremor 03/31/2014  . Iliotibial band syndrome affecting left lower leg 01/01/2014  . Spondylolisthesis at L4-L5 level 01/01/2014  . Sinus bradycardia 12/31/2013  . Greater trochanteric bursitis of right hip 12/04/2013  . OSA on CPAP   . Diet-controlled diabetes mellitus ([place]) 07/08/2009  . COLONIC POLYPS, HX OF 12/31/2008  . GOUT 12/22/2008  . Essential hypertension 12/22/2008  . Diverticulosis of colon 12/22/2008  . NEPHROLITHIASIS, HX OF 12/22/2008    Troy Banks  PTA 06/27/2017, 2:49 PM  [facility] [address] [place], [place], [address] Phone: [phone]   Fax:  [phone]  Name: Troy Banks MRN: [id number] Date of Birth: [birth year]

## 2017-06-28 ENCOUNTER — Other Ambulatory Visit: Payer: Self-pay | Admitting: Internal Medicine

## 2017-06-29 ENCOUNTER — Ambulatory Visit: Payer: Medicare Other | Admitting: Physical Therapy

## 2017-06-29 ENCOUNTER — Encounter: Payer: Self-pay | Admitting: Physical Therapy

## 2017-06-29 DIAGNOSIS — R262 Difficulty in walking, not elsewhere classified: Secondary | ICD-10-CM

## 2017-06-29 DIAGNOSIS — M25672 Stiffness of left ankle, not elsewhere classified: Secondary | ICD-10-CM | POA: Diagnosis not present

## 2017-06-29 DIAGNOSIS — R600 Localized edema: Secondary | ICD-10-CM

## 2017-06-29 DIAGNOSIS — M25572 Pain in left ankle and joints of left foot: Secondary | ICD-10-CM

## 2017-06-29 NOTE — Therapy (Signed)
[place], [place], [address] Phone: [phone]   Fax:  [phone]  Physical Therapy Treatment  Patient Details  Name: Troy Banks MRN: [id number] Date of Birth: 12/29/1940 Referring Provider: Katha Hamming, MD   Encounter Date: 06/29/2017  PT End of Session - 06/29/17 0720    Visit Number  4    Number of Visits  24    Date for PT Re-Evaluation  09/08/17    Authorization Type  MEDICARE    Authorization Time Period  KX at visit 15    PT Start Time  0715    PT Stop Time  0810    PT Time Calculation (min)  55 min       Past Medical History:  Diagnosis Date  . Allergy   . Anxiety    PTSD  . Aortic stenosis, moderate 03/27/2015   post AVR echo Echo 11/16: Mild LVH, EF 55-60%, normal wall motion, grade 1 diastolic dysfunction, bioprosthetic AVR okay (mean gradient 18 mmHg) with trivial AI, mild LAE, atrial septal aneurysm, small effusion   . Arthritis    "qwhere" (10/29/2015)  . Carotid artery stenosis    Carotid US 11/17: 1-39% bilateral ICA stenosis. F/u prn  . Cervical stenosis of spinal canal    fusion 2006  . CHF (congestive heart failure) ([place])    "secondry to OHS"  . Closed left pilon fracture   . COLONIC POLYPS, HX OF   . Complication of anesthesia    "takes a lot to put him under and has woken up during surgery" Difficult to wake up . PTSD; "does not metabolize RX well"Pt. reports that he gets "violent", per pt.   . Diabetes mellitus without complication (HCC)    diet controlled  . DIVERTICULOSIS, COLON   . Dysrhythmia    2 bouts of afib post op and at home but corrected with medication.  . Familial tremor 03/19/2014  . GERD (gastroesophageal reflux disease)    uses Zantac  . GOUT   . Headache   . Heart murmur   . High cholesterol   . HYPERTENSION    off ACEI 2010 because of hyperkalemia in setting of ARI  . Hypothyroidism    newly diaganosed 09/2015  . Kidney stones    remote open stone-retrieval on L  . Myocardial infarction [facility])    October 20th 2016  . OSA on CPAP   . 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 PTSD (post-traumatic stress disorder)   . Sleep apnea    On a CPAP    Past Surgical History:  Procedure Laterality Date  . ANKLE FUSION Left 03/17/2017   Procedure: FUSION  PILON FRACTURE WITH BONE GRAFTING;  Surgeon: Shona Needles, MD;  Location: [place];  Service: Orthopedics;  Laterality: Left;  . ANTERIOR FUSION CERVICAL SPINE  2002  . AORTIC VALVE REPLACEMENT N/A 03/30/2015   Procedure: AORTIC VALVE REPLACEMENT (AVR);  Surgeon: Grace Isaac, MD;  Location: [place];  Service: Open Heart Surgery;  Laterality: N/A;  . BACK SURGERY    . CARDIAC CATHETERIZATION N/A 03/26/2015   Procedure: Left Heart Cath and Coronary Angiography;  Surgeon: Leonie Man, MD;  Location: [place] CV LAB;  Service: Cardiovascular;  Laterality: N/A;  . CARDIAC VALVE REPLACEMENT    . CORONARY ARTERY BYPASS GRAFT N/A 03/30/2015   Procedure: CORONARY ARTERY BYPASS GRAFTING (CABG) x four, using  left internal mammary artery and left    leg greater saphenous vein harvested endoscopically ;  Surgeon: Grace Isaac, MD;  Location: [place];  Service: Open Heart Surgery;  Laterality: N/A;  . CYSTOSCOPY/URETEROSCOPY/HOLMIUM LASER/STENT PLACEMENT Bilateral 11/07/2016   Procedure: CYSTOSCOPY/URETEROSCOPY/HOLMIUM LASER/STENT PLACEMENT;  Surgeon: Nickie Retort, MD;  [facility];  Service: Urology;  Laterality: Bilateral;  . CYSTOSCOPY/URETEROSCOPY/HOLMIUM LASER/STENT PLACEMENT Bilateral 11/23/2016   Procedure: CYSTOSCOPY/BILATERAL URETEROSCOPY/HOLMIUM LASER/STENT EXCHANGE;  Surgeon: Nickie Retort, MD;  [facility];  Service: Urology;  Laterality: Bilateral;  NEEDS 90 MIN   . EXTERNAL FIXATION LEG Left 02/22/2017   Procedure: EXTERNAL FIXATION LEFT ANKLE;  Surgeon: Renette Butters, MD;   Location: [place];  Service: Orthopedics;  Laterality: Left;  . HEMORRHOID BANDING  1970s  . INGUINAL HERNIA REPAIR Left 1962  . JOINT REPLACEMENT    . [place]   "cut me open"  . POSTERIOR FUSION CERVICAL SPINE  2006  . TEE WITHOUT CARDIOVERSION N/A 03/30/2015   Procedure: TRANSESOPHAGEAL ECHOCARDIOGRAM (TEE);  Surgeon: Grace Isaac, MD;  Location: [place];  Service: Open Heart Surgery;  Laterality: N/A;  . TESTICLE TORSION REDUCTION Right 1960  . TOE FUSION Left 1985 & 2004   "great toe"  . TONSILLECTOMY    . TOTAL HIP ARTHROPLASTY Right 10/27/2015   Procedure: RIGHT TOTAL HIP ARTHROPLASTY ANTERIOR APPROACH and steroid injection right foot;  Surgeon: Mcarthur Rossetti, MD;  Location: [place];  Service: Orthopedics;  Laterality: Right;    There were no vitals filed for this visit.                   [place] Adult PT Treatment/Exercise - 06/29/17 0001      Ambulation/Gait   Ambulation Distance (Feet)  150 Feet    Assistive device  Rolling walker    Gait Pattern  Step-through pattern    Ambulation Surface  Indoor    Gait Comments  gait in parallel bars with light touch only. Progressed to equal step lengths with RW and decreased pressure through UE.       Vasopneumatic   Number Minutes Vasopneumatic   15 minutes    Vasopnuematic Location   Ankle    Vasopneumatic Pressure  Low    Vasopneumatic Temperature   34      Ankle Exercises: Seated   Towel Inversion/Eversion  5 reps    Other Seated Ankle Exercises  Self PROM all planes seated with foot on opposite knee     Other Seated Ankle Exercises  toe curla and uncurls multiple reps, toe extensions       Ankle Exercises: Supine   Other Supine Ankle Exercises  3 way hip SLR x 10       Ankle Exercises: Aerobic   Stationary Bike  Rec bike L1 x 5 minutes ( sock foot)                PT Short Term Goals - 06/27/17 1439      PT SHORT TERM GOAL #1   Title  He will be independnet with  initial HEP     Baseline  Noe exercises formally eissued yet.  He is curling and uncurling his toes.    Time  4    Period  Weeks    Status  On-going      PT SHORT TERM GOAL #2   Title  He will be able to walk WBAT with SPC in home  Baseline  walker 100%    Time  4    Period  Weeks    Status  On-going      PT SHORT TERM GOAL #3   Title  Swelling improved to 59 cm    Baseline  58.5 cm    Time  4    Period  Weeks    Status  Achieved        PT Long Term Goals - 06/20/17 1194      PT LONG TERM GOAL #1   Title  He will be independent with all HEP issued    Time  12    Period  Weeks    Status  New      PT LONG TERM GOAL #2   Title  He will be able to walk with least restricted device community distances    Time  12    Period  Weeks    Status  New      PT LONG TERM GOAL #3   Title  He will return to riding motorcycle if allowed by surgeon    Time  12    Period  Weeks    Status  New      PT LONG TERM GOAL #4   Title  He will report returning to all home tasks except more challenging activity like getting on roof.    Time  12    Period  Weeks    Status  New      PT LONG TERM GOAL #5   Title  swelling will improve to no more than 1 cm greater girth than RT    Time  12    Period  Weeks    Status  New      Additional Long Term Goals   Additional Long Term Goals  --      PT LONG TERM GOAL #6   Title  pain will be intermittant and no more than 1-2/10    Time  12    Period  Weeks    Status  New      PT LONG TERM GOAL #7   Title  FOTO score improved to 20% limited or less    Time  12    Period  Weeks    Status  New            Plan - 06/29/17 1740    Clinical Impression Statement  Pt reports 3 hours of shopping yesterday on feet with pain up to 5-6/10. Pain 2-3/10 now. Gait training today with RW, progressing to light UE assist and equal step lengths. Pt able to do this without c/o pain inrease. Began recumbent bike without difficulty. Vaso repeated for  edema management.     PT Next Visit Plan  Vaso , manual AROM , weigth bearing activity.  HEP?  toe curls,  sitting toe stretching?add  hip 4 way.  continue rec bike -    PT Home Exercise Plan  edema management.      Consulted and Agree with Plan of Care  Patient       Patient will benefit from skilled therapeutic intervention in order to improve the following deficits and impairments:  Pain, Decreased activity tolerance, Decreased balance, Decreased range of motion, Decreased strength, Increased edema, Difficulty walking  Visit Diagnosis: Pain in left ankle and joints of left foot  Stiffness of left ankle, not elsewhere classified  Localized edema  Difficulty in walking, not elsewhere classified  Problem List Patient Active Problem List   Diagnosis Date Noted  . Pilon fracture 03/17/2017  . Pilon fracture of left tibia 03/17/2017  . At risk for adverse drug reaction 02/28/2017  . Closed fracture dislocation of ankle joint, left, with delayed healing, subsequent encounter 02/22/2017  . Renal stone 10/26/2016  . Gross hematuria 10/26/2016  . Bladder neck obstruction 10/26/2016  . External hemorrhoid, thrombosed 06/10/2016  . Internal hemorrhoids 06/10/2016  . Lumbar back pain 05/19/2016  . Osteoarthritis of right hip 10/27/2015  . Status post total replacement of right hip 10/27/2015  . Chronic chest pain 09/22/2015  . Acute gouty arthritis 06/11/2015  . CAD (coronary artery disease), native coronary artery 06/11/2015  . Chronic diastolic CHF (congestive heart failure) ([place]) 06/11/2015  . H/O tissue AVR Oct 2016 06/10/2015  . PAF post CABG/AVR- Amiodarone 06/10/2015  . Dyslipidemia 06/10/2015  . S/P CABG x 4-Oct 2016 03/30/2015  . H/O NSTEMI-Oct 2016 03/27/2015  . 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 Depression 09/08/2014  . GERD (gastroesophageal reflux disease)   . Essential tremor 03/31/2014  . Iliotibial band syndrome affecting  left lower leg 01/01/2014  . Spondylolisthesis at L4-L5 level 01/01/2014  . Sinus bradycardia 12/31/2013  . Greater trochanteric bursitis of right hip 12/04/2013  . OSA on CPAP   . Diet-controlled diabetes mellitus ([place]) 07/08/2009  . COLONIC POLYPS, HX OF 12/31/2008  . GOUT 12/22/2008  . Essential hypertension 12/22/2008  . Diverticulosis of colon 12/22/2008  . NEPHROLITHIASIS, HX OF 12/22/2008    Dorene Ar, PTA 06/29/2017, 8:45 AM  [facility] [address] [place], [place], [address] Phone: [phone]   Fax:  [phone]  Name: KEKAI GETER MRN: [id number] Date of Birth: 1940-07-07

## 2017-07-04 ENCOUNTER — Ambulatory Visit: Payer: Medicare Other

## 2017-07-04 DIAGNOSIS — M25572 Pain in left ankle and joints of left foot: Secondary | ICD-10-CM

## 2017-07-04 DIAGNOSIS — R600 Localized edema: Secondary | ICD-10-CM

## 2017-07-04 DIAGNOSIS — R262 Difficulty in walking, not elsewhere classified: Secondary | ICD-10-CM

## 2017-07-04 DIAGNOSIS — M25672 Stiffness of left ankle, not elsewhere classified: Secondary | ICD-10-CM

## 2017-07-04 NOTE — Therapy (Signed)
[place], [place], [address] Phone: [phone]   Fax:  [phone]  Physical Therapy Treatment  Patient Details  Name: Troy Banks MRN: [id number] Date of Birth: [birth year] Referring Provider: Katha Hamming, MD   Encounter Date: 07/04/2017  PT End of Session - 07/04/17 0843    Visit Number  5    Number of Visits  24    Date for PT Re-Evaluation  09/08/17    Authorization Type  MEDICARE    Authorization Time Period  KX at visit 15    PT Start Time  0845    PT Stop Time  0940    PT Time Calculation (min)  55 min    Activity Tolerance  Patient tolerated treatment well    Behavior During Therapy  [facility] for tasks assessed/performed       Past Medical History:  Diagnosis Date  . Allergy   . Anxiety    PTSD  . Aortic stenosis, moderate 03/27/2015   post AVR echo Echo 11/16: Mild LVH, EF 55-60%, normal wall motion, grade 1 diastolic dysfunction, bioprosthetic AVR okay (mean gradient 18 mmHg) with trivial AI, mild LAE, atrial septal aneurysm, small effusion   . Arthritis    "qwhere" (10/29/2015)  . Carotid artery stenosis    Carotid US 11/17: 1-39% bilateral ICA stenosis. F/u prn  . Cervical stenosis of spinal canal    fusion 2006  . CHF (congestive heart failure) ([place])    "secondry to OHS"  . Closed left pilon fracture   . COLONIC POLYPS, HX OF   . Complication of anesthesia    "takes a lot to put him under and has woken up during surgery" Difficult to wake up . PTSD; "does not metabolize RX well"Pt. reports that he gets "violent", per pt.   . Diabetes mellitus without complication (HCC)    diet controlled  . DIVERTICULOSIS, COLON   . Dysrhythmia    2 bouts of afib post op and at home but corrected with medication.  . Familial tremor 03/19/2014  . GERD (gastroesophageal reflux disease)    uses Zantac  . GOUT   . Headache   . Heart murmur   . High cholesterol   . HYPERTENSION    off ACEI  2010 because of hyperkalemia in setting of ARI  . Hypothyroidism    newly diaganosed 09/2015  . Kidney stones    remote open stone-retrieval on L  . Myocardial infarction [facility])    October 20th 2016  . OSA on CPAP   . 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 PTSD (post-traumatic stress disorder)   . Sleep apnea    On a CPAP    Past Surgical History:  Procedure Laterality Date  . ANKLE FUSION Left 03/17/2017   Procedure: FUSION  PILON FRACTURE WITH BONE GRAFTING;  Surgeon: Shona Needles, MD;  Location: [place];  Service: Orthopedics;  Laterality: Left;  . ANTERIOR FUSION CERVICAL SPINE  2002  . AORTIC VALVE REPLACEMENT N/A 03/30/2015   Procedure: AORTIC VALVE REPLACEMENT (AVR);  Surgeon: Grace Isaac, MD;  Location: [place];  Service: Open Heart Surgery;  Laterality: N/A;  . BACK SURGERY    . CARDIAC CATHETERIZATION N/A 03/26/2015   Procedure: Left Heart Cath and Coronary Angiography;  Surgeon: Leonie Man, MD;  Location: [place] [facility];  Service: Cardiovascular;  Laterality: N/A;  . CARDIAC VALVE  REPLACEMENT    . CORONARY ARTERY BYPASS GRAFT N/A 03/30/2015   Procedure: CORONARY ARTERY BYPASS GRAFTING (CABG) x four, using left internal mammary artery and left    leg greater saphenous vein harvested endoscopically ;  Surgeon: Grace Isaac, MD;  Location: [place];  Service: Open Heart Surgery;  Laterality: N/A;  . CYSTOSCOPY/URETEROSCOPY/HOLMIUM LASER/STENT PLACEMENT Bilateral 11/07/2016   Procedure: CYSTOSCOPY/URETEROSCOPY/HOLMIUM LASER/STENT PLACEMENT;  Surgeon: Nickie Retort, MD;  [facility];  Service: Urology;  Laterality: Bilateral;  . CYSTOSCOPY/URETEROSCOPY/HOLMIUM LASER/STENT PLACEMENT Bilateral 11/23/2016   Procedure: CYSTOSCOPY/BILATERAL URETEROSCOPY/HOLMIUM LASER/STENT EXCHANGE;  Surgeon: Nickie Retort, MD;  [facility];  Service: Urology;  Laterality: Bilateral;  NEEDS 90 MIN    . EXTERNAL FIXATION LEG Left 02/22/2017   Procedure: EXTERNAL FIXATION LEFT ANKLE;  Surgeon: Renette Butters, MD;  Location: [place];  Service: Orthopedics;  Laterality: Left;  . HEMORRHOID BANDING  1970s  . INGUINAL HERNIA REPAIR Left 1962  . JOINT REPLACEMENT    . [place]   "cut me open"  . POSTERIOR FUSION CERVICAL SPINE  2006  . TEE WITHOUT CARDIOVERSION N/A 03/30/2015   Procedure: TRANSESOPHAGEAL ECHOCARDIOGRAM (TEE);  Surgeon: Grace Isaac, MD;  Location: [place];  Service: Open Heart Surgery;  Laterality: N/A;  . TESTICLE TORSION REDUCTION Right 1960  . TOE FUSION Left 1985 & 2004   "great toe"  . TONSILLECTOMY    . TOTAL HIP ARTHROPLASTY Right 10/27/2015   Procedure: RIGHT TOTAL HIP ARTHROPLASTY ANTERIOR APPROACH and steroid injection right foot;  Surgeon: Mcarthur Rossetti, MD;  Location: [place];  Service: Orthopedics;  Laterality: Right;    There were no vitals filed for this visit.  Subjective Assessment - 07/04/17 0845    Subjective  More pain today than since the surgery Started this weekend.  Located deep medial distal tibia.  No fall , bumping of bone or any trauma no excess walking.       How long can you walk comfortably?  . Pain medial                       [facility] Adult PT Treatment/Exercise - 07/04/17 0001      Vasopneumatic   Number Minutes Vasopneumatic   15 minutes    Vasopnuematic Location   Ankle    Vasopneumatic Pressure  Low    Vasopneumatic Temperature   34      Manual Therapy   Manual therapy comments  kineseotape medical LT ankle long Y along bone      Ankle Exercises: Seated   Towel Crunch  -- 12 reps  along with inversion eversion    Marble Pickup  x15 reps             PT Education - 07/04/17 0911    Education provided  Yes    Education Details  Need to elevate and ice more, decr weight bearing for now and cont ROM exercises to see if can dcr pain towel exer x 12 reps 2x/day  each    Person(s)  Educated  Patient    Methods  Explanation;Demonstration;Verbal cues;Handout    Comprehension  Verbalized understanding;Returned demonstration       PT Short Term Goals - 07/04/17 5681      PT SHORT TERM GOAL #1   Title  He will be independnet with initial HEP     Baseline  issued today    Status  On-going  PT SHORT TERM GOAL #2   Title  He will be able to walk WBAT with SPC in home    Baseline  incr pain so decr weight    Status  On-going      PT SHORT TERM GOAL #3   Title  Swelling improved to 59 cm    Baseline  58.5 cm    Status  Achieved        PT Long Term Goals - 06/20/17 3329      PT LONG TERM GOAL #1   Title  He will be independent with all HEP issued    Time  12    Period  Weeks    Status  New      PT LONG TERM GOAL #2   Title  He will be able to walk with least restricted device community distances    Time  12    Period  Weeks    Status  New      PT LONG TERM GOAL #3   Title  He will return to riding motorcycle if allowed by surgeon    Time  12    Period  Weeks    Status  New      PT LONG TERM GOAL #4   Title  He will report returning to all home tasks except more challenging activity like getting on roof.    Time  12    Period  Weeks    Status  New      PT LONG TERM GOAL #5   Title  swelling will improve to no more than 1 cm greater girth than RT    Time  12    Period  Weeks    Status  New      Additional Long Term Goals   Additional Long Term Goals  --      PT LONG TERM GOAL #6   Title  pain will be intermittant and no more than 1-2/10    Time  12    Period  Weeks    Status  New      PT LONG TERM GOAL #7   Title  FOTO score improved to 20% limited or less    Time  12    Period  Weeks    Status  New            Plan - 07/04/17 5188    Clinical Impression Statement  Progress cautiously with weight bearing and exercise and ease off if pain does not decr with walking.  PT needed reenforcement to ease off at home    PT  Treatment/Interventions  Cryotherapy;Vasopneumatic Device;Therapeutic exercise;Therapeutic activities;Gait training;Patient/family education;Passive range of motion;Manual techniques;Taping    PT Next Visit Plan  Vaso , manual AROM , weigth bearing activity.  HEP?  toe curls,  sitting toe stretching?add  hip 4 way.  continue rec bike -  ASSESS PAIN LEVEL treat based on this    PT Home Exercise Plan  edema management.  towel exercise    Consulted and Agree with Plan of Care  Patient       Patient will benefit from skilled therapeutic intervention in order to improve the following deficits and impairments:  Pain, Decreased activity tolerance, Decreased balance, Decreased range of motion, Decreased strength, Increased edema, Difficulty walking  Visit Diagnosis: Pain in left ankle and joints of left foot  Stiffness of left ankle, not elsewhere classified  Localized edema  Difficulty in walking, not elsewhere classified  Problem List Patient Active Problem List   Diagnosis Date Noted  . Pilon fracture 03/17/2017  . Pilon fracture of left tibia 03/17/2017  . At risk for adverse drug reaction 02/28/2017  . Closed fracture dislocation of ankle joint, left, with delayed healing, subsequent encounter 02/22/2017  . Renal stone 10/26/2016  . Gross hematuria 10/26/2016  . Bladder neck obstruction 10/26/2016  . External hemorrhoid, thrombosed 06/10/2016  . Internal hemorrhoids 06/10/2016  . Lumbar back pain 05/19/2016  . Osteoarthritis of right hip 10/27/2015  . Status post total replacement of right hip 10/27/2015  . Chronic chest pain 09/22/2015  . Acute gouty arthritis 06/11/2015  . CAD (coronary artery disease), native coronary artery 06/11/2015  . Chronic diastolic CHF (congestive heart failure) ([place]) 06/11/2015  . H/O tissue AVR Oct 2016 06/10/2015  . PAF post CABG/AVR- Amiodarone 06/10/2015  . Dyslipidemia 06/10/2015  . S/P CABG x 4-Oct 2016 03/30/2015  . H/O NSTEMI-Oct 2016  03/27/2015  . 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 Depression 09/08/2014  . GERD (gastroesophageal reflux disease)   . Essential tremor 03/31/2014  . Iliotibial band syndrome affecting left lower leg 01/01/2014  . Spondylolisthesis at L4-L5 level 01/01/2014  . Sinus bradycardia 12/31/2013  . Greater trochanteric bursitis of right hip 12/04/2013  . OSA on CPAP   . Diet-controlled diabetes mellitus ([place]) 07/08/2009  . COLONIC POLYPS, HX OF 12/31/2008  . GOUT 12/22/2008  . Essential hypertension 12/22/2008  . Diverticulosis of colon 12/22/2008  . NEPHROLITHIASIS, HX OF 12/22/2008    Darrel Hoover  PT 07/04/2017, 9:39 AM  [facility] [address] [place], [place], [address] Phone: [phone]   Fax:  [phone]  Name: CLENTON ESPER MRN: [id number] Date of Birth: Jan 16, 1941

## 2017-07-04 NOTE — Patient Instructions (Signed)
.  ankle exer with towel inver Jeronimo Norma , scrunch and marble lift

## 2017-07-06 ENCOUNTER — Ambulatory Visit: Payer: Medicare Other | Admitting: Physical Therapy

## 2017-07-06 ENCOUNTER — Encounter: Payer: Self-pay | Admitting: Physical Therapy

## 2017-07-06 DIAGNOSIS — M25672 Stiffness of left ankle, not elsewhere classified: Secondary | ICD-10-CM

## 2017-07-06 DIAGNOSIS — R262 Difficulty in walking, not elsewhere classified: Secondary | ICD-10-CM | POA: Diagnosis not present

## 2017-07-06 DIAGNOSIS — M25572 Pain in left ankle and joints of left foot: Secondary | ICD-10-CM | POA: Diagnosis not present

## 2017-07-06 DIAGNOSIS — R600 Localized edema: Secondary | ICD-10-CM

## 2017-07-06 NOTE — Therapy (Signed)
[place], [place], [address] Phone: [phone]   Fax:  [phone]  Physical Therapy Treatment  Patient Details  Name: Troy Banks MRN: [id number] Date of Birth: 1941/01/14 Referring Provider: Katha Hamming, MD   Encounter Date: 07/06/2017  PT End of Session - 07/06/17 1812    Visit Number  6    Number of Visits  24    Date for PT Re-Evaluation  09/08/17    PT Start Time  0731    PT Stop Time  0814    PT Time Calculation (min)  43 min    Activity Tolerance  Patient tolerated treatment well    Behavior During Therapy  [facility] for tasks assessed/performed       Past Medical History:  Diagnosis Date  . Allergy   . Anxiety    PTSD  . Aortic stenosis, moderate 03/27/2015   post AVR echo Echo 11/16: Mild LVH, EF 55-60%, normal wall motion, grade 1 diastolic dysfunction, bioprosthetic AVR okay (mean gradient 18 mmHg) with trivial AI, mild LAE, atrial septal aneurysm, small effusion   . Arthritis    "qwhere" (10/29/2015)  . Carotid artery stenosis    Carotid US 11/17: 1-39% bilateral ICA stenosis. F/u prn  . Cervical stenosis of spinal canal    fusion 2006  . CHF (congestive heart failure) ([place])    "secondry to OHS"  . Closed left pilon fracture   . COLONIC POLYPS, HX OF   . Complication of anesthesia    "takes a lot to put him under and has woken up during surgery" Difficult to wake up . PTSD; "does not metabolize RX well"Pt. reports that he gets "violent", per pt.   . Diabetes mellitus without complication (HCC)    diet controlled  . DIVERTICULOSIS, COLON   . Dysrhythmia    2 bouts of afib post op and at home but corrected with medication.  . Familial tremor 03/19/2014  . GERD (gastroesophageal reflux disease)    uses Zantac  . GOUT   . Headache   . Heart murmur   . High cholesterol   . HYPERTENSION    off ACEI 2010 because of hyperkalemia in setting of ARI  . Hypothyroidism    newly  diaganosed 09/2015  . Kidney stones    remote open stone-retrieval on L  . Myocardial infarction [facility])    October 20th 2016  . OSA on CPAP   . 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 PTSD (post-traumatic stress disorder)   . Sleep apnea    On a CPAP    Past Surgical History:  Procedure Laterality Date  . ANKLE FUSION Left 03/17/2017   Procedure: FUSION  PILON FRACTURE WITH BONE GRAFTING;  Surgeon: Shona Needles, MD;  Location: [place];  Service: Orthopedics;  Laterality: Left;  . ANTERIOR FUSION CERVICAL SPINE  2002  . AORTIC VALVE REPLACEMENT N/A 03/30/2015   Procedure: AORTIC VALVE REPLACEMENT (AVR);  Surgeon: Grace Isaac, MD;  Location: [place];  Service: Open Heart Surgery;  Laterality: N/A;  . BACK SURGERY    . CARDIAC CATHETERIZATION N/A 03/26/2015   Procedure: Left Heart Cath and Coronary Angiography;  Surgeon: Leonie Man, MD;  Location: [place] [facility];  Service: Cardiovascular;  Laterality: N/A;  . CARDIAC VALVE REPLACEMENT    . CORONARY ARTERY BYPASS GRAFT N/A 03/30/2015   Procedure: CORONARY ARTERY BYPASS GRAFTING (  CABG) x four, using left internal mammary artery and left    leg greater saphenous vein harvested endoscopically ;  Surgeon: Grace Isaac, MD;  Location: [place];  Service: Open Heart Surgery;  Laterality: N/A;  . CYSTOSCOPY/URETEROSCOPY/HOLMIUM LASER/STENT PLACEMENT Bilateral 11/07/2016   Procedure: CYSTOSCOPY/URETEROSCOPY/HOLMIUM LASER/STENT PLACEMENT;  Surgeon: Nickie Retort, MD;  [facility];  Service: Urology;  Laterality: Bilateral;  . CYSTOSCOPY/URETEROSCOPY/HOLMIUM LASER/STENT PLACEMENT Bilateral 11/23/2016   Procedure: CYSTOSCOPY/BILATERAL URETEROSCOPY/HOLMIUM LASER/STENT EXCHANGE;  Surgeon: Nickie Retort, MD;  [facility];  Service: Urology;  Laterality: Bilateral;  NEEDS 90 MIN   . EXTERNAL FIXATION LEG Left 02/22/2017   Procedure: EXTERNAL FIXATION LEFT  ANKLE;  Surgeon: Renette Butters, MD;  Location: [place];  Service: Orthopedics;  Laterality: Left;  . HEMORRHOID BANDING  1970s  . INGUINAL HERNIA REPAIR Left 1962  . JOINT REPLACEMENT    . [place]   "cut me open"  . POSTERIOR FUSION CERVICAL SPINE  2006  . TEE WITHOUT CARDIOVERSION N/A 03/30/2015   Procedure: TRANSESOPHAGEAL ECHOCARDIOGRAM (TEE);  Surgeon: Grace Isaac, MD;  Location: [place];  Service: Open Heart Surgery;  Laterality: N/A;  . TESTICLE TORSION REDUCTION Right 1960  . TOE FUSION Left 1985 & 2004   "great toe"  . TONSILLECTOMY    . TOTAL HIP ARTHROPLASTY Right 10/27/2015   Procedure: RIGHT TOTAL HIP ARTHROPLASTY ANTERIOR APPROACH and steroid injection right foot;  Surgeon: Mcarthur Rossetti, MD;  Location: [place];  Service: Orthopedics;  Laterality: Right;    There were no vitals filed for this visit.  Subjective Assessment - 07/06/17 0734    Subjective  I WAS ABLE TO REST YESTERDAY.  I USED THE KNEE SCOOTER.  I did it to my self (Over did)    Currently in Pain?  Yes    Pain Score  3     Pain Orientation  Left;Anterior;Lateral    Pain Descriptors / Indicators  Aching;Throbbing    Aggravating Factors   WALKING, WEIGHT,  WALKING WITH LEG ROLLED OUT    Pain Relieving Factors  REST MEDS,  uSING SCOOTER,   ELEVATION    Effect of Pain on Daily Activities  WALKER.  SCOOTER  LESS TIME ON FEET         [facility] PT Assessment - 07/06/17 0001      Figure 8 Edema   Figure 8 - Left   56.5  cm                  [facility] Adult PT Treatment/Exercise - 07/06/17 0001      Vasopneumatic   Number Minutes Vasopneumatic   15 minutes    Vasopnuematic Location   Ankle    Vasopneumatic Pressure  Low    Vasopneumatic Temperature   34      Ankle Exercises: Aerobic   Stationary Bike  Recumbant bike L1,  5 minutes.       Ankle Exercises: Seated   Towel Crunch  -- 10    Towel Inversion/Eversion  -- 10 reps    Marble Pickup  13 marbles    Heel Raises   10 reps focus on toe stretching not ankle    Toe Raise  10 reps               PT Short Term Goals - 07/04/17 2694      PT SHORT TERM GOAL #1   Title  He will be independnet with initial HEP  Baseline  issued today    Status  On-going      PT SHORT TERM GOAL #2   Title  He will be able to walk WBAT with SPC in home    Baseline  incr pain so decr weight    Status  On-going      PT SHORT TERM GOAL #3   Title  Swelling improved to 59 cm    Baseline  58.5 cm    Status  Achieved        PT Long Term Goals - 06/20/17 2703      PT LONG TERM GOAL #1   Title  He will be independent with all HEP issued    Time  12    Period  Weeks    Status  New      PT LONG TERM GOAL #2   Title  He will be able to walk with least restricted device community distances    Time  12    Period  Weeks    Status  New      PT LONG TERM GOAL #3   Title  He will return to riding motorcycle if allowed by surgeon    Time  12    Period  Weeks    Status  New      PT LONG TERM GOAL #4   Title  He will report returning to all home tasks except more challenging activity like getting on roof.    Time  12    Period  Weeks    Status  New      PT LONG TERM GOAL #5   Title  swelling will improve to no more than 1 cm greater girth than RT    Time  12    Period  Weeks    Status  New      Additional Long Term Goals   Additional Long Term Goals  --      PT LONG TERM GOAL #6   Title  pain will be intermittant and no more than 1-2/10    Time  12    Period  Weeks    Status  New      PT LONG TERM GOAL #7   Title  FOTO score improved to 20% limited or less    Time  12    Period  Weeks    Status  New            Plan - 07/06/17 1813    Clinical Impression Statement  Patient was able to rest and decrease his pain.  Re enforcement continued for activity restriction to assist with pain management.  Edema figure 8= 56.5.cm.  Pain 2/10 at end of session.    PT Next Visit Plan  [place] ,  manual AROM , weigth bearing activity.  HEP?  toe curls,  sitting toe stretching?add  hip 4 way.  continue rec bike -  ASSESS PAIN LEVEL treat based on this       Patient will benefit from skilled therapeutic intervention in order to improve the following deficits and impairments:     Visit Diagnosis: Pain in left ankle and joints of left foot  Stiffness of left ankle, not elsewhere classified  Localized edema  Difficulty in walking, not elsewhere classified     Problem List Patient Active Problem List   Diagnosis Date Noted  . Pilon fracture 03/17/2017  . Pilon fracture of left tibia 03/17/2017  . At risk for  adverse drug reaction 02/28/2017  . Closed fracture dislocation of ankle joint, left, with delayed healing, subsequent encounter 02/22/2017  . Renal stone 10/26/2016  . Gross hematuria 10/26/2016  . Bladder neck obstruction 10/26/2016  . External hemorrhoid, thrombosed 06/10/2016  . Internal hemorrhoids 06/10/2016  . Lumbar back pain 05/19/2016  . Osteoarthritis of right hip 10/27/2015  . Status post total replacement of right hip 10/27/2015  . Chronic chest pain 09/22/2015  . Acute gouty arthritis 06/11/2015  . CAD (coronary artery disease), native coronary artery 06/11/2015  . Chronic diastolic CHF (congestive heart failure) ([place]) 06/11/2015  . H/O tissue AVR Oct 2016 06/10/2015  . PAF post CABG/AVR- Amiodarone 06/10/2015  . Dyslipidemia 06/10/2015  . S/P CABG x 4-Oct 2016 03/30/2015  . H/O NSTEMI-Oct 2016 03/27/2015  . 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 Depression 09/08/2014  . GERD (gastroesophageal reflux disease)   . Essential tremor 03/31/2014  . Iliotibial band syndrome affecting left lower leg 01/01/2014  . Spondylolisthesis at L4-L5 level 01/01/2014  . Sinus bradycardia 12/31/2013  . Greater trochanteric bursitis of right hip 12/04/2013  . OSA on CPAP   . Diet-controlled diabetes mellitus ([place]) 07/08/2009  .  COLONIC POLYPS, HX OF 12/31/2008  . GOUT 12/22/2008  . Essential hypertension 12/22/2008  . Diverticulosis of colon 12/22/2008  . NEPHROLITHIASIS, HX OF 12/22/2008    [facility] 07/06/2017, 6:19 PM  [facility] [address] [place], [place], [address] Phone: [phone]   Fax:  [phone]  Name: Troy Banks MRN: [id number] Date of Birth: Dec 15, 1940

## 2017-07-11 ENCOUNTER — Ambulatory Visit: Payer: Medicare Other | Attending: Student

## 2017-07-11 DIAGNOSIS — R262 Difficulty in walking, not elsewhere classified: Secondary | ICD-10-CM | POA: Diagnosis not present

## 2017-07-11 DIAGNOSIS — R600 Localized edema: Secondary | ICD-10-CM | POA: Diagnosis not present

## 2017-07-11 DIAGNOSIS — M25572 Pain in left ankle and joints of left foot: Secondary | ICD-10-CM | POA: Insufficient documentation

## 2017-07-11 DIAGNOSIS — M25672 Stiffness of left ankle, not elsewhere classified: Secondary | ICD-10-CM | POA: Insufficient documentation

## 2017-07-11 NOTE — Therapy (Signed)
[place], [place], [address] Phone: [phone]   Fax:  [phone]  Physical Therapy Treatment  Patient Details  Name: Troy Banks MRN: [id number] Date of Birth: 12-20-1940 Referring Provider: Katha Hamming, MD   Encounter Date: 07/11/2017  PT End of Session - 07/11/17 0846    Visit Number  7    Number of Visits  24    Date for PT Re-Evaluation  09/08/17    Authorization Type  MEDICARE    Authorization Time Period  KX at visit 15    PT Start Time  0845    PT Stop Time  0940    PT Time Calculation (min)  55 min    Activity Tolerance  Patient tolerated treatment well    Behavior During Therapy  [facility] for tasks assessed/performed       Past Medical History:  Diagnosis Date  . Allergy   . Anxiety    PTSD  . Aortic stenosis, moderate 03/27/2015   post AVR echo Echo 11/16: Mild LVH, EF 55-60%, normal wall motion, grade 1 diastolic dysfunction, bioprosthetic AVR okay (mean gradient 18 mmHg) with trivial AI, mild LAE, atrial septal aneurysm, small effusion   . Arthritis    "qwhere" (10/29/2015)  . Carotid artery stenosis    Carotid US 11/17: 1-39% bilateral ICA stenosis. F/u prn  . Cervical stenosis of spinal canal    fusion 2006  . CHF (congestive heart failure) ([place])    "secondry to OHS"  . Closed left pilon fracture   . COLONIC POLYPS, HX OF   . Complication of anesthesia    "takes a lot to put him under and has woken up during surgery" Difficult to wake up . PTSD; "does not metabolize RX well"Pt. reports that he gets "violent", per pt.   . Diabetes mellitus without complication (HCC)    diet controlled  . DIVERTICULOSIS, COLON   . Dysrhythmia    2 bouts of afib post op and at home but corrected with medication.  . Familial tremor 03/19/2014  . GERD (gastroesophageal reflux disease)    uses Zantac  . GOUT   . Headache   . Heart murmur   . High cholesterol   . HYPERTENSION    off ACEI  2010 because of hyperkalemia in setting of ARI  . Hypothyroidism    newly diaganosed 09/2015  . Kidney stones    remote open stone-retrieval on L  . Myocardial infarction [facility])    October 20th 2016  . OSA on CPAP   . 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 PTSD (post-traumatic stress disorder)   . Sleep apnea    On a CPAP    Past Surgical History:  Procedure Laterality Date  . ANKLE FUSION Left 03/17/2017   Procedure: FUSION  PILON FRACTURE WITH BONE GRAFTING;  Surgeon: Shona Needles, MD;  Location: [place];  Service: Orthopedics;  Laterality: Left;  . ANTERIOR FUSION CERVICAL SPINE  2002  . AORTIC VALVE REPLACEMENT N/A 03/30/2015   Procedure: AORTIC VALVE REPLACEMENT (AVR);  Surgeon: Grace Isaac, MD;  Location: [place];  Service: Open Heart Surgery;  Laterality: N/A;  . BACK SURGERY    . CARDIAC CATHETERIZATION N/A 03/26/2015   Procedure: Left Heart Cath and Coronary Angiography;  Surgeon: Leonie Man, MD;  Location: [place] CV LAB;  Service: Cardiovascular;  Laterality: N/A;  . CARDIAC VALVE  REPLACEMENT    . CORONARY ARTERY BYPASS GRAFT N/A 03/30/2015   Procedure: CORONARY ARTERY BYPASS GRAFTING (CABG) x four, using left internal mammary artery and left    leg greater saphenous vein harvested endoscopically ;  Surgeon: Grace Isaac, MD;  Location: [place];  Service: Open Heart Surgery;  Laterality: N/A;  . CYSTOSCOPY/URETEROSCOPY/HOLMIUM LASER/STENT PLACEMENT Bilateral 11/07/2016   Procedure: CYSTOSCOPY/URETEROSCOPY/HOLMIUM LASER/STENT PLACEMENT;  Surgeon: Nickie Retort, MD;  [facility];  Service: Urology;  Laterality: Bilateral;  . CYSTOSCOPY/URETEROSCOPY/HOLMIUM LASER/STENT PLACEMENT Bilateral 11/23/2016   Procedure: CYSTOSCOPY/BILATERAL URETEROSCOPY/HOLMIUM LASER/STENT EXCHANGE;  Surgeon: Nickie Retort, MD;  [facility];  Service: Urology;  Laterality: Bilateral;  NEEDS 90 MIN    . EXTERNAL FIXATION LEG Left 02/22/2017   Procedure: EXTERNAL FIXATION LEFT ANKLE;  Surgeon: Renette Butters, MD;  Location: [place];  Service: Orthopedics;  Laterality: Left;  . HEMORRHOID BANDING  1970s  . INGUINAL HERNIA REPAIR Left 1962  . JOINT REPLACEMENT    . [place]   "cut me open"  . POSTERIOR FUSION CERVICAL SPINE  2006  . TEE WITHOUT CARDIOVERSION N/A 03/30/2015   Procedure: TRANSESOPHAGEAL ECHOCARDIOGRAM (TEE);  Surgeon: Grace Isaac, MD;  Location: [place];  Service: Open Heart Surgery;  Laterality: N/A;  . TESTICLE TORSION REDUCTION Right 1960  . TOE FUSION Left 1985 & 2004   "great toe"  . TONSILLECTOMY    . TOTAL HIP ARTHROPLASTY Right 10/27/2015   Procedure: RIGHT TOTAL HIP ARTHROPLASTY ANTERIOR APPROACH and steroid injection right foot;  Surgeon: Mcarthur Rossetti, MD;  Location: [place];  Service: Orthopedics;  Laterality: Right;    There were no vitals filed for this visit.  Subjective Assessment - 07/11/17 0851    Subjective  Feels less swelling    Currently in Pain?  Yes    Pain Score  3     Pain Location  Ankle    Pain Orientation  Left;Anterior    Pain Descriptors / Indicators  Aching    Pain Type  Chronic pain    Pain Onset  More than a month ago    Pain Frequency  Constant    Aggravating Factors   walking      Pain Relieving Factors  rest, meds leelvation     Multiple Pain Sites  No                      [facility] Adult PT Treatment/Exercise - 07/11/17 0001      Vasopneumatic   Number Minutes Vasopneumatic   15 minutes    Vasopnuematic Location   Ankle    Vasopneumatic Pressure  Low    Vasopneumatic Temperature   34      Manual Therapy   Manual Therapy  Soft tissue mobilization;Passive ROM;Joint mobilization    Joint Mobilization  Foot and toes / ankle        Soft tissue mobilization  foot and ankle and calf    Passive ROM  all planes and toes DFPF      Ankle Exercises: Aerobic   Stationary Bike  Recumbant  bike L2  5 minutes.       Ankle Exercises: Stretches   Plantar Fascia Stretch  1 rep;60 seconds with strap             PT Education - 07/11/17 0930    Education provided  Yes    Education Details  DF stretch with strap 30-60 sec x 2-3x/day  1-3 reps    Person(s) Educated  Patient    Methods  Explanation;Tactile cues;Verbal cues;Handout    Comprehension  Verbalized understanding;Returned demonstration       PT Short Term Goals - 07/04/17 9735      PT SHORT TERM GOAL #1   Title  He will be independnet with initial HEP     Baseline  issued today    Status  On-going      PT SHORT TERM GOAL #2   Title  He will be able to walk WBAT with SPC in home    Baseline  incr pain so decr weight    Status  On-going      PT SHORT TERM GOAL #3   Title  Swelling improved to 59 cm    Baseline  58.5 cm    Status  Achieved        PT Long Term Goals - 06/20/17 3299      PT LONG TERM GOAL #1   Title  He will be independent with all HEP issued    Time  12    Period  Weeks    Status  New      PT LONG TERM GOAL #2   Title  He will be able to walk with least restricted device community distances    Time  12    Period  Weeks    Status  New      PT LONG TERM GOAL #3   Title  He will return to riding motorcycle if allowed by surgeon    Time  12    Period  Weeks    Status  New      PT LONG TERM GOAL #4   Title  He will report returning to all home tasks except more challenging activity like getting on roof.    Time  12    Period  Weeks    Status  New      PT LONG TERM GOAL #5   Title  swelling will improve to no more than 1 cm greater girth than RT    Time  12    Period  Weeks    Status  New      Additional Long Term Goals   Additional Long Term Goals  --      PT LONG TERM GOAL #6   Title  pain will be intermittant and no more than 1-2/10    Time  12    Period  Weeks    Status  New      PT LONG TERM GOAL #7   Title  FOTO score improved to 20% limited or less     Time  12    Period  Weeks    Status  New            Plan - 07/11/17 2426    Clinical Impression Statement  Continued stiffness but swelling is minimal.   pain controlled again.  Continue with weight bearing and ROm /STW     PT Treatment/Interventions  Cryotherapy;Vasopneumatic Device;Therapeutic exercise;Therapeutic activities;Gait training;Patient/family education;Passive range of motion;Manual techniques;Taping    PT Next Visit Plan  Vaso , manual AROM , weigth bearing activity.  HEP?  toe curls,  sitting toe stretching?add  hip 4 way.  continue rec bike -  ASSESS PAIN LEVEL treat based on this    PT Home Exercise Plan  edema management.  towel exercise, DF stretch    Consulted and Agree with  Plan of Care  Patient       Patient will benefit from skilled therapeutic intervention in order to improve the following deficits and impairments:  Pain, Decreased activity tolerance, Decreased balance, Decreased range of motion, Decreased strength, Increased edema, Difficulty walking  Visit Diagnosis: Pain in left ankle and joints of left foot  Stiffness of left ankle, not elsewhere classified  Localized edema  Difficulty in walking, not elsewhere classified     Problem List Patient Active Problem List   Diagnosis Date Noted  . Pilon fracture 03/17/2017  . Pilon fracture of left tibia 03/17/2017  . At risk for adverse drug reaction 02/28/2017  . Closed fracture dislocation of ankle joint, left, with delayed healing, subsequent encounter 02/22/2017  . Renal stone 10/26/2016  . Gross hematuria 10/26/2016  . Bladder neck obstruction 10/26/2016  . External hemorrhoid, thrombosed 06/10/2016  . Internal hemorrhoids 06/10/2016  . Lumbar back pain 05/19/2016  . Osteoarthritis of right hip 10/27/2015  . Status post total replacement of right hip 10/27/2015  . Chronic chest pain 09/22/2015  . Acute gouty arthritis 06/11/2015  . CAD (coronary artery disease), native coronary artery  06/11/2015  . Chronic diastolic CHF (congestive heart failure) ([place]) 06/11/2015  . H/O tissue AVR Oct 2016 06/10/2015  . PAF post CABG/AVR- Amiodarone 06/10/2015  . Dyslipidemia 06/10/2015  . S/P CABG x 4-Oct 2016 03/30/2015  . H/O NSTEMI-Oct 2016 03/27/2015  . 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)   . Depression 09/08/2014  . GERD (gastroesophageal reflux disease)   . Essential tremor 03/31/2014  . Iliotibial band syndrome affecting left lower leg 01/01/2014  . Spondylolisthesis at L4-L5 level 01/01/2014  . Sinus bradycardia 12/31/2013  . Greater trochanteric bursitis of right hip 12/04/2013  . OSA on CPAP   . Diet-controlled diabetes mellitus ([place]) 07/08/2009  . COLONIC POLYPS, HX OF 12/31/2008  . GOUT 12/22/2008  . Essential hypertension 12/22/2008  . Diverticulosis of colon 12/22/2008  . NEPHROLITHIASIS, HX OF 12/22/2008    Darrel Hoover  PT 07/11/2017, 9:33 AM  [facility] [address] [place], [place], [address] Phone: [phone]   Fax:  [phone]  Name: ANTONIS LOR MRN: [id number] Date of Birth: [birth year]

## 2017-07-13 ENCOUNTER — Ambulatory Visit: Payer: Medicare Other

## 2017-07-13 DIAGNOSIS — R600 Localized edema: Secondary | ICD-10-CM

## 2017-07-13 DIAGNOSIS — M25572 Pain in left ankle and joints of left foot: Secondary | ICD-10-CM

## 2017-07-13 DIAGNOSIS — R262 Difficulty in walking, not elsewhere classified: Secondary | ICD-10-CM

## 2017-07-13 DIAGNOSIS — M25672 Stiffness of left ankle, not elsewhere classified: Secondary | ICD-10-CM

## 2017-07-13 NOTE — Therapy (Signed)
[place], [place], [address] Phone: [phone]   Fax:  [phone]  Physical Therapy Treatment  Patient Details  Name: Troy Banks MRN: [id number] Date of Birth: [birth year] Referring Provider: Katha Hamming, MD   Encounter Date: 07/13/2017  PT End of Session - 07/13/17 0848    Visit Number  8    Number of Visits  24    Date for PT Re-Evaluation  09/08/17    Authorization Type  MEDICARE    Authorization Time Period  KX at visit 15    PT Start Time  0847    PT Stop Time  0940    PT Time Calculation (min)  53 min    Activity Tolerance  Patient tolerated treatment well    Behavior During Therapy  [facility] for tasks assessed/performed       Past Medical History:  Diagnosis Date  . Allergy   . Anxiety    PTSD  . Aortic stenosis, moderate 03/27/2015   post AVR echo Echo 11/16: Mild LVH, EF 55-60%, normal wall motion, grade 1 diastolic dysfunction, bioprosthetic AVR okay (mean gradient 18 mmHg) with trivial AI, mild LAE, atrial septal aneurysm, small effusion   . Arthritis    "qwhere" (10/29/2015)  . Carotid artery stenosis    Carotid US 11/17: 1-39% bilateral ICA stenosis. F/u prn  . Cervical stenosis of spinal canal    fusion 2006  . CHF (congestive heart failure) ([place])    "secondry to OHS"  . Closed left pilon fracture   . COLONIC POLYPS, HX OF   . Complication of anesthesia    "takes a lot to put him under and has woken up during surgery" Difficult to wake up . PTSD; "does not metabolize RX well"Pt. reports that he gets "violent", per pt.   . Diabetes mellitus without complication (HCC)    diet controlled  . DIVERTICULOSIS, COLON   . Dysrhythmia    2 bouts of afib post op and at home but corrected with medication.  . Familial tremor 03/19/2014  . GERD (gastroesophageal reflux disease)    uses Zantac  . GOUT   . Headache   . Heart murmur   . High cholesterol   . HYPERTENSION    off ACEI  2010 because of hyperkalemia in setting of ARI  . Hypothyroidism    newly diaganosed 09/2015  . Kidney stones    remote open stone-retrieval on L  . Myocardial infarction [facility])    October 20th 2016  . OSA on CPAP   . 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 PTSD (post-traumatic stress disorder)   . Sleep apnea    On a CPAP    Past Surgical History:  Procedure Laterality Date  . ANKLE FUSION Left 03/17/2017   Procedure: FUSION  PILON FRACTURE WITH BONE GRAFTING;  Surgeon: Shona Needles, MD;  Location: [place];  Service: Orthopedics;  Laterality: Left;  . ANTERIOR FUSION CERVICAL SPINE  2002  . AORTIC VALVE REPLACEMENT N/A 03/30/2015   Procedure: AORTIC VALVE REPLACEMENT (AVR);  Surgeon: Grace Isaac, MD;  Location: [place];  Service: Open Heart Surgery;  Laterality: N/A;  . BACK SURGERY    . CARDIAC CATHETERIZATION N/A 03/26/2015   Procedure: Left Heart Cath and Coronary Angiography;  Surgeon: Leonie Man, MD;  Location: [place] [facility];  Service: Cardiovascular;  Laterality: N/A;  . CARDIAC VALVE  REPLACEMENT    . CORONARY ARTERY BYPASS GRAFT N/A 03/30/2015   Procedure: CORONARY ARTERY BYPASS GRAFTING (CABG) x four, using left internal mammary artery and left    leg greater saphenous vein harvested endoscopically ;  Surgeon: Grace Isaac, MD;  Location: [place];  Service: Open Heart Surgery;  Laterality: N/A;  . CYSTOSCOPY/URETEROSCOPY/HOLMIUM LASER/STENT PLACEMENT Bilateral 11/07/2016   Procedure: CYSTOSCOPY/URETEROSCOPY/HOLMIUM LASER/STENT PLACEMENT;  Surgeon: Nickie Retort, MD;  [facility];  Service: Urology;  Laterality: Bilateral;  . CYSTOSCOPY/URETEROSCOPY/HOLMIUM LASER/STENT PLACEMENT Bilateral 11/23/2016   Procedure: CYSTOSCOPY/BILATERAL URETEROSCOPY/HOLMIUM LASER/STENT EXCHANGE;  Surgeon: Nickie Retort, MD;  [facility];  Service: Urology;  Laterality: Bilateral;  NEEDS 90 MIN    . EXTERNAL FIXATION LEG Left 02/22/2017   Procedure: EXTERNAL FIXATION LEFT ANKLE;  Surgeon: Renette Butters, MD;  Location: [place];  Service: Orthopedics;  Laterality: Left;  . HEMORRHOID BANDING  1970s  . INGUINAL HERNIA REPAIR Left 1962  . JOINT REPLACEMENT    . [place]   "cut me open"  . POSTERIOR FUSION CERVICAL SPINE  2006  . TEE WITHOUT CARDIOVERSION N/A 03/30/2015   Procedure: TRANSESOPHAGEAL ECHOCARDIOGRAM (TEE);  Surgeon: Grace Isaac, MD;  Location: [place];  Service: Open Heart Surgery;  Laterality: N/A;  . TESTICLE TORSION REDUCTION Right 1960  . TOE FUSION Left 1985 & 2004   "great toe"  . TONSILLECTOMY    . TOTAL HIP ARTHROPLASTY Right 10/27/2015   Procedure: RIGHT TOTAL HIP ARTHROPLASTY ANTERIOR APPROACH and steroid injection right foot;  Surgeon: Mcarthur Rossetti, MD;  Location: [place];  Service: Orthopedics;  Laterality: Right;    There were no vitals filed for this visit.  Subjective Assessment - 07/13/17 0851    Subjective  No complaints . Feels improving . low pain    Pain Score  2     Pain Location  Ankle    Pain Orientation  Left;Anterior    Pain Descriptors / Indicators  Aching    Pain Type  Chronic pain    Pain Onset  More than a month ago    Pain Frequency  Constant    Aggravating Factors   weight bearing    Pain Relieving Factors  rest , meds , elevation    Multiple Pain Sites  No                      [facility] Adult PT Treatment/Exercise - 07/13/17 0001      Ambulation/Gait   Gait Comments  SPC walking with boot in place then pregait stepping with light suppor tto bars      Vasopneumatic   Number Minutes Vasopneumatic   15 minutes    Vasopnuematic Location   Ankle    Vasopneumatic Pressure  Low    Vasopneumatic Temperature   34      Manual Therapy   Joint Mobilization  Foot and toes / ankle        Soft tissue mobilization  foot and ankle and calf    Passive ROM  all planes and toes DFPF      Ankle  Exercises: Aerobic   Stationary Bike  Recumbant bike L3  6 minutes.       Ankle Exercises: Stretches   Other Stretch  standing with walker knee flexion DF stretch and knee extension DF stretch chaning foot positon  x20 reps before and 20 reps after  manual  then 30 reps DF in standing  PT Short Term Goals - 07/04/17 7893      PT SHORT TERM GOAL #1   Title  He will be independnet with initial HEP     Baseline  issued today    Status  On-going      PT SHORT TERM GOAL #2   Title  He will be able to walk WBAT with SPC in home    Baseline  incr pain so decr weight    Status  On-going      PT SHORT TERM GOAL #3   Title  Swelling improved to 59 cm    Baseline  58.5 cm    Status  Achieved        PT Long Term Goals - 06/20/17 8101      PT LONG TERM GOAL #1   Title  He will be independent with all HEP issued    Time  12    Period  Weeks    Status  New      PT LONG TERM GOAL #2   Title  He will be able to walk with least restricted device community distances    Time  12    Period  Weeks    Status  New      PT LONG TERM GOAL #3   Title  He will return to riding motorcycle if allowed by surgeon    Time  12    Period  Weeks    Status  New      PT LONG TERM GOAL #4   Title  He will report returning to all home tasks except more challenging activity like getting on roof.    Time  12    Period  Weeks    Status  New      PT LONG TERM GOAL #5   Title  swelling will improve to no more than 1 cm greater girth than RT    Time  12    Period  Weeks    Status  New      Additional Long Term Goals   Additional Long Term Goals  --      PT LONG TERM GOAL #6   Title  pain will be intermittant and no more than 1-2/10    Time  12    Period  Weeks    Status  New      PT LONG TERM GOAL #7   Title  FOTO score improved to 20% limited or less    Time  12    Period  Weeks    Status  New            Plan - 07/13/17 0848    Clinical Impression Statement   tolerated weight bearing stanidng exercises out of boot with walker support.    ROM limited and expect he may not have more than now.        PT Treatment/Interventions  Cryotherapy;Vasopneumatic Device;Therapeutic exercise;Therapeutic activities;Gait training;Patient/family education;Passive range of motion;Manual techniques;Taping    PT Next Visit Plan  Vaso   SPC ambulation trial, manual AROM , weigth bearing activity. fot HEP  HEP  toe curls,  ?add  band standing hip 4 way to HEP.  continue rec bike -  ASSESS PAIN LEVEL treat based on this    PT Home Exercise Plan  edema management.  towel exercise, DF stretch    Consulted and Agree with Plan of Care  Patient       Patient will benefit  from skilled therapeutic intervention in order to improve the following deficits and impairments:  Pain, Decreased activity tolerance, Decreased balance, Decreased range of motion, Decreased strength, Increased edema, Difficulty walking  Visit Diagnosis: Pain in left ankle and joints of left foot  Stiffness of left ankle, not elsewhere classified  Localized edema  Difficulty in walking, not elsewhere classified     Problem List Patient Active Problem List   Diagnosis Date Noted  . Pilon fracture 03/17/2017  . Pilon fracture of left tibia 03/17/2017  . At risk for adverse drug reaction 02/28/2017  . Closed fracture dislocation of ankle joint, left, with delayed healing, subsequent encounter 02/22/2017  . Renal stone 10/26/2016  . Gross hematuria 10/26/2016  . Bladder neck obstruction 10/26/2016  . External hemorrhoid, thrombosed 06/10/2016  . Internal hemorrhoids 06/10/2016  . Lumbar back pain 05/19/2016  . Osteoarthritis of right hip 10/27/2015  . Status post total replacement of right hip 10/27/2015  . Chronic chest pain 09/22/2015  . Acute gouty arthritis 06/11/2015  . CAD (coronary artery disease), native coronary artery 06/11/2015  . Chronic diastolic CHF (congestive heart failure) ([place])  06/11/2015  . H/O tissue AVR Oct 2016 06/10/2015  . PAF post CABG/AVR- Amiodarone 06/10/2015  . Dyslipidemia 06/10/2015  . S/P CABG x 4-Oct 2016 03/30/2015  . H/O NSTEMI-Oct 2016 03/27/2015  . 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 Depression 09/08/2014  . GERD (gastroesophageal reflux disease)   . Essential tremor 03/31/2014  . Iliotibial band syndrome affecting left lower leg 01/01/2014  . Spondylolisthesis at L4-L5 level 01/01/2014  . Sinus bradycardia 12/31/2013  . Greater trochanteric bursitis of right hip 12/04/2013  . OSA on CPAP   . Diet-controlled diabetes mellitus ([place]) 07/08/2009  . COLONIC POLYPS, HX OF 12/31/2008  . GOUT 12/22/2008  . Essential hypertension 12/22/2008  . Diverticulosis of colon 12/22/2008  . NEPHROLITHIASIS, HX OF 12/22/2008    Darrel Hoover PT 07/13/2017, 9:29 AM  [facility] [address] [place], [place], [address] Phone: [phone]   Fax:  [phone]  Name: Troy Banks MRN: [id number] Date of Birth: [birth year]

## 2017-07-17 ENCOUNTER — Ambulatory Visit: Payer: Medicare Other

## 2017-07-17 DIAGNOSIS — R262 Difficulty in walking, not elsewhere classified: Secondary | ICD-10-CM | POA: Diagnosis not present

## 2017-07-17 DIAGNOSIS — R600 Localized edema: Secondary | ICD-10-CM | POA: Diagnosis not present

## 2017-07-17 DIAGNOSIS — M25672 Stiffness of left ankle, not elsewhere classified: Secondary | ICD-10-CM

## 2017-07-17 DIAGNOSIS — M25572 Pain in left ankle and joints of left foot: Secondary | ICD-10-CM | POA: Diagnosis not present

## 2017-07-17 NOTE — Therapy (Signed)
[place], [place], [address] Phone: [phone]   Fax:  [phone]  Physical Therapy Treatment  Patient Details  Name: Troy Banks MRN: [id number] Date of Birth: 07-18-1940 Referring Provider: Katha Hamming, MD   Encounter Date: 07/17/2017  PT End of Session - 07/17/17 0759    Visit Number  9    Number of Visits  24    Date for PT Re-Evaluation  09/08/17    Authorization Type  MEDICARE    Authorization Time Period  KX at visit 15    PT Start Time  0800    PT Stop Time  0853    PT Time Calculation (min)  53 min    Activity Tolerance  Patient tolerated treatment well    Behavior During Therapy  [facility] for tasks assessed/performed       Past Medical History:  Diagnosis Date  . Allergy   . Anxiety    PTSD  . Aortic stenosis, moderate 03/27/2015   post AVR echo Echo 11/16: Mild LVH, EF 55-60%, normal wall motion, grade 1 diastolic dysfunction, bioprosthetic AVR okay (mean gradient 18 mmHg) with trivial AI, mild LAE, atrial septal aneurysm, small effusion   . Arthritis    "qwhere" (10/29/2015)  . Carotid artery stenosis    Carotid US 11/17: 1-39% bilateral ICA stenosis. F/u prn  . Cervical stenosis of spinal canal    fusion 2006  . CHF (congestive heart failure) ([place])    "secondry to OHS"  . Closed left pilon fracture   . COLONIC POLYPS, HX OF   . Complication of anesthesia    "takes a lot to put him under and has woken up during surgery" Difficult to wake up . PTSD; "does not metabolize RX well"Pt. reports that he gets "violent", per pt.   . Diabetes mellitus without complication (HCC)    diet controlled  . DIVERTICULOSIS, COLON   . Dysrhythmia    2 bouts of afib post op and at home but corrected with medication.  . Familial tremor 03/19/2014  . GERD (gastroesophageal reflux disease)    uses Zantac  . GOUT   . Headache   . Heart murmur   . High cholesterol   . HYPERTENSION    off ACEI  2010 because of hyperkalemia in setting of ARI  . Hypothyroidism    newly diaganosed 09/2015  . Kidney stones    remote open stone-retrieval on L  . Myocardial infarction [facility])    October 20th 2016  . OSA on CPAP   . 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 PTSD (post-traumatic stress disorder)   . Sleep apnea    On a CPAP    Past Surgical History:  Procedure Laterality Date  . ANKLE FUSION Left 03/17/2017   Procedure: FUSION  PILON FRACTURE WITH BONE GRAFTING;  Surgeon: Shona Needles, MD;  Location: [place];  Service: Orthopedics;  Laterality: Left;  . ANTERIOR FUSION CERVICAL SPINE  2002  . AORTIC VALVE REPLACEMENT N/A 03/30/2015   Procedure: AORTIC VALVE REPLACEMENT (AVR);  Surgeon: Grace Isaac, MD;  Location: [place];  Service: Open Heart Surgery;  Laterality: N/A;  . BACK SURGERY    . CARDIAC CATHETERIZATION N/A 03/26/2015   Procedure: Left Heart Cath and Coronary Angiography;  Surgeon: Leonie Man, MD;  Location: [place] [facility];  Service: Cardiovascular;  Laterality: N/A;  . CARDIAC VALVE  REPLACEMENT    . CORONARY ARTERY BYPASS GRAFT N/A 03/30/2015   Procedure: CORONARY ARTERY BYPASS GRAFTING (CABG) x four, using left internal mammary artery and left    leg greater saphenous vein harvested endoscopically ;  Surgeon: Grace Isaac, MD;  Location: [place];  Service: Open Heart Surgery;  Laterality: N/A;  . CYSTOSCOPY/URETEROSCOPY/HOLMIUM LASER/STENT PLACEMENT Bilateral 11/07/2016   Procedure: CYSTOSCOPY/URETEROSCOPY/HOLMIUM LASER/STENT PLACEMENT;  Surgeon: Nickie Retort, MD;  [facility];  Service: Urology;  Laterality: Bilateral;  . CYSTOSCOPY/URETEROSCOPY/HOLMIUM LASER/STENT PLACEMENT Bilateral 11/23/2016   Procedure: CYSTOSCOPY/BILATERAL URETEROSCOPY/HOLMIUM LASER/STENT EXCHANGE;  Surgeon: Nickie Retort, MD;  [facility];  Service: Urology;  Laterality: Bilateral;  NEEDS 90 MIN    . EXTERNAL FIXATION LEG Left 02/22/2017   Procedure: EXTERNAL FIXATION LEFT ANKLE;  Surgeon: Renette Butters, MD;  Location: [place];  Service: Orthopedics;  Laterality: Left;  . HEMORRHOID BANDING  1970s  . INGUINAL HERNIA REPAIR Left 1962  . JOINT REPLACEMENT    . [place]   "cut me open"  . POSTERIOR FUSION CERVICAL SPINE  2006  . TEE WITHOUT CARDIOVERSION N/A 03/30/2015   Procedure: TRANSESOPHAGEAL ECHOCARDIOGRAM (TEE);  Surgeon: Grace Isaac, MD;  Location: [place];  Service: Open Heart Surgery;  Laterality: N/A;  . TESTICLE TORSION REDUCTION Right 1960  . TOE FUSION Left 1985 & 2004   "great toe"  . TONSILLECTOMY    . TOTAL HIP ARTHROPLASTY Right 10/27/2015   Procedure: RIGHT TOTAL HIP ARTHROPLASTY ANTERIOR APPROACH and steroid injection right foot;  Surgeon: Mcarthur Rossetti, MD;  Location: [place];  Service: Orthopedics;  Laterality: Right;    There were no vitals filed for this visit.  Subjective Assessment - 07/17/17 0814    Subjective  No complaints.      Pain Score  3     Pain Location  Ankle    Pain Orientation  Left;Anterior    Pain Descriptors / Indicators  Aching    Pain Type  Chronic pain    Pain Onset  More than a month ago    Pain Frequency  Constant    Aggravating Factors   weiught bear, cold wet weather    Pain Relieving Factors  rest meds elevation    Multiple Pain Sites  No         [facility] PT Assessment - 07/17/17 0001      AROM   Left Ankle Dorsiflexion  92    Left Ankle Plantar Flexion  108    Left Ankle Inversion  10    Left Ankle Eversion  8                  [facility] Adult PT Treatment/Exercise - 07/17/17 0001      Vasopneumatic   Number Minutes Vasopneumatic   15 minutes    Vasopnuematic Location   Ankle    Vasopneumatic Pressure  Low    Vasopneumatic Temperature   34      Manual Therapy   Joint Mobilization  Foot and toes / ankle        Soft tissue mobilization  foot and ankle and calf    Passive ROM   all planes and toes DF/PF      Ankle Exercises: Aerobic   Stationary Bike  Recumbant bike L3  5 minutes.       Ankle Exercises: Supine   Other Supine Ankle Exercises  hip SLR avduct ext, flesion x 15 reps then seated LAQ  and knee flexion x 15 with green band for home  and active ROM all planes                PT Short Term Goals - 07/04/17 5465      PT SHORT TERM GOAL #1   Title  He will be independnet with initial HEP     Baseline  issued today    Status  On-going      PT SHORT TERM GOAL #2   Title  He will be able to walk WBAT with SPC in home    Baseline  incr pain so decr weight    Status  On-going      PT SHORT TERM GOAL #3   Title  Swelling improved to 59 cm    Baseline  58.5 cm    Status  Achieved        PT Long Term Goals - 06/20/17 0354      PT LONG TERM GOAL #1   Title  He will be independent with all HEP issued    Time  12    Period  Weeks    Status  New      PT LONG TERM GOAL #2   Title  He will be able to walk with least restricted device community distances    Time  12    Period  Weeks    Status  New      PT LONG TERM GOAL #3   Title  He will return to riding motorcycle if allowed by surgeon    Time  12    Period  Weeks    Status  New      PT LONG TERM GOAL #4   Title  He will report returning to all home tasks except more challenging activity like getting on roof.    Time  12    Period  Weeks    Status  New      PT LONG TERM GOAL #5   Title  swelling will improve to no more than 1 cm greater girth than RT    Time  12    Period  Weeks    Status  New      Additional Long Term Goals   Additional Long Term Goals  --      PT LONG TERM GOAL #6   Title  pain will be intermittant and no more than 1-2/10    Time  12    Period  Weeks    Status  New      PT LONG TERM GOAL #7   Title  FOTO score improved to 20% limited or less    Time  12    Period  Weeks    Status  New            Plan - 07/17/17 0758    Clinical  Impression Statement  AROM improved.  LEg strength added to prep for getting off RW and WBAT out of boot     PT Treatment/Interventions  Cryotherapy;Vasopneumatic Device;Therapeutic exercise;Therapeutic activities;Gait training;Patient/family education;Passive range of motion;Manual techniques;Taping    PT Next Visit Plan  Vaso   SPC ambulation trial, manual AROM , weigth bearing activity. fot HEP  HEP  toe curls,  ?add  band standing hip 4 way to HEP.  continue rec bike -  ASSESS PAIN LEVEL treat based on this    PT Home Exercise Plan  edema management.  towel exercise, DF stretch, SLR  3 way , LAQ, knee flex seated green band    Consulted and Agree with Plan of Care  Patient       Patient will benefit from skilled therapeutic intervention in order to improve the following deficits and impairments:  Pain, Decreased activity tolerance, Decreased balance, Decreased range of motion, Decreased strength, Increased edema, Difficulty walking  Visit Diagnosis: Pain in left ankle and joints of left foot  Stiffness of left ankle, not elsewhere classified  Localized edema  Difficulty in walking, not elsewhere classified     Problem List Patient Active Problem List   Diagnosis Date Noted  . Pilon fracture 03/17/2017  . Pilon fracture of left tibia 03/17/2017  . At risk for adverse drug reaction 02/28/2017  . Closed fracture dislocation of ankle joint, left, with delayed healing, subsequent encounter 02/22/2017  . Renal stone 10/26/2016  . Gross hematuria 10/26/2016  . Bladder neck obstruction 10/26/2016  . External hemorrhoid, thrombosed 06/10/2016  . Internal hemorrhoids 06/10/2016  . Lumbar back pain 05/19/2016  . Osteoarthritis of right hip 10/27/2015  . Status post total replacement of right hip 10/27/2015  . Chronic chest pain 09/22/2015  . Acute gouty arthritis 06/11/2015  . CAD (coronary artery disease), native coronary artery 06/11/2015  . Chronic diastolic CHF (congestive heart  failure) ([place]) 06/11/2015  . H/O tissue AVR Oct 2016 06/10/2015  . PAF post CABG/AVR- Amiodarone 06/10/2015  . Dyslipidemia 06/10/2015  . S/P CABG x 4-Oct 2016 03/30/2015  . H/O NSTEMI-Oct 2016 03/27/2015  . 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 Depression 09/08/2014  . GERD (gastroesophageal reflux disease)   . Essential tremor 03/31/2014  . Iliotibial band syndrome affecting left lower leg 01/01/2014  . Spondylolisthesis at L4-L5 level 01/01/2014  . Sinus bradycardia 12/31/2013  . Greater trochanteric bursitis of right hip 12/04/2013  . OSA on CPAP   . Diet-controlled diabetes mellitus ([place]) 07/08/2009  . COLONIC POLYPS, HX OF 12/31/2008  . GOUT 12/22/2008  . Essential hypertension 12/22/2008  . Diverticulosis of colon 12/22/2008  . NEPHROLITHIASIS, HX OF 12/22/2008    Troy Banks  {PT 07/17/2017, 8:42 AM  [place], [place], [address] Phone: [phone]   Fax:  [phone]  Name: Troy Banks MRN: [id number] Date of Birth: [birth year]

## 2017-07-20 ENCOUNTER — Ambulatory Visit: Payer: Medicare Other | Admitting: Physical Therapy

## 2017-07-20 ENCOUNTER — Encounter: Payer: Self-pay | Admitting: Physical Therapy

## 2017-07-20 DIAGNOSIS — M25672 Stiffness of left ankle, not elsewhere classified: Secondary | ICD-10-CM

## 2017-07-20 DIAGNOSIS — R262 Difficulty in walking, not elsewhere classified: Secondary | ICD-10-CM

## 2017-07-20 DIAGNOSIS — M25572 Pain in left ankle and joints of left foot: Secondary | ICD-10-CM | POA: Diagnosis not present

## 2017-07-20 DIAGNOSIS — R600 Localized edema: Secondary | ICD-10-CM | POA: Diagnosis not present

## 2017-07-20 NOTE — Therapy (Addendum)
[facility] [address] [place], [place], [address] Phone: [phone]   Fax:  [phone]  Physical Therapy Treatment Progress Note Reporting Period 06/20/17 to 07/20/17  See note below for Objective Data and Assessment of Progress/Goals.      Patient Details  Name: Troy Banks MRN: [id number] Date of Birth: 08/14/1940 Referring Provider: Kevin Haddix, MD   Encounter Date: 07/20/2017  PT End of Session - 07/20/17 1414    Visit Number  10    Number of Visits  24    Date for PT Re-Evaluation  09/08/17    PT Start Time  0736    PT Stop Time  0817    PT Time Calculation (min)  41 min    Activity Tolerance  Patient tolerated treatment well    Behavior During Therapy  [facility] for tasks assessed/performed       Past Medical History:  Diagnosis Date  . Allergy   . Anxiety    PTSD  . Aortic stenosis, moderate 03/27/2015   post AVR echo Echo 11/16: Mild LVH, EF 55-60%, normal wall motion, grade 1 diastolic dysfunction, bioprosthetic AVR okay (mean gradient 18 mmHg) with trivial AI, mild LAE, atrial septal aneurysm, small effusion   . Arthritis    "qwhere" (10/29/2015)  . Carotid artery stenosis    Carotid US 11/17: 1-39% bilateral ICA stenosis. F/u prn  . Cervical stenosis of spinal canal    fusion 2006  . CHF (congestive heart failure) (H[facility])    "secondry to OHS"  . Closed left pilon fracture   . COLONIC POLYPS, HX OF   . Complication of anesthesia    "takes a lot to put him under and has woken up during surgery" Difficult to wake up . PTSD; "does not metabolize RX well"Pt. reports that he gets "violent", per pt.   . Diabetes mellitus without complication (HCC)    diet controlled  . DIVERTICULOSIS, COLON   . Dysrhythmia    2 bouts of afib post op and at home but corrected with medication.  . Familial tremor 03/19/2014  . GERD (gastroesophageal reflux disease)    uses Zantac  . GOUT   . Headache   . Heart murmur    . High cholesterol   . HYPERTENSION    off ACEI 2010 because of hyperkalemia in setting of ARI  . Hypothyroidism    newly diaganosed 09/2015  . Kidney stones    remote open stone-retrieval on L  . Myocardial infarction (HCC)    October 20th 2016  . OSA on CPAP   . 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 PTSD (post-traumatic stress disorder)   . Sleep apnea    On a CPAP    Past Surgical History:  Procedure Laterality Date  . ANKLE FUSION Left 03/17/2017   Procedure: FUSION  PILON FRACTURE WITH BONE GRAFTING;  Surgeon: Haddix, Kevin P, MD;  [facility];  Service: Orthopedics;  Laterality: Left;  . ANTERIOR FUSION CERVICAL SPINE  2002  . AORTIC VALVE REPLACEMENT N/A 03/30/2015   Procedure: AORTIC VALVE REPLACEMENT (AVR);  Surgeon: Edward B Gerhardt, MD;  [facility];  Service: Open Heart Surgery;  Laterality: N/A;  . BACK SURGERY    . CARDIAC CATHETERIZATION N/A 03/26/2015   Procedure: Left Heart Cath and Coronary Angiography;  Surgeon: David W Harding, MD;  [facility];  Service: Cardiovascular;  Laterality: N/A;  .   CARDIAC VALVE REPLACEMENT    . CORONARY ARTERY BYPASS GRAFT N/A 03/30/2015   Procedure: CORONARY ARTERY BYPASS GRAFTING (CABG) x four, using left internal mammary artery and left    leg greater saphenous vein harvested endoscopically ;  Surgeon: Grace Isaac, MD;  Location: [place];  Service: Open Heart Surgery;  Laterality: N/A;  . CYSTOSCOPY/URETEROSCOPY/HOLMIUM LASER/STENT PLACEMENT Bilateral 11/07/2016   Procedure: CYSTOSCOPY/URETEROSCOPY/HOLMIUM LASER/STENT PLACEMENT;  Surgeon: Nickie Retort, MD;  [facility];  Service: Urology;  Laterality: Bilateral;  . CYSTOSCOPY/URETEROSCOPY/HOLMIUM LASER/STENT PLACEMENT Bilateral 11/23/2016   Procedure: CYSTOSCOPY/BILATERAL URETEROSCOPY/HOLMIUM LASER/STENT EXCHANGE;  Surgeon: Nickie Retort, MD;  [facility];   Service: Urology;  Laterality: Bilateral;  NEEDS 90 MIN   . EXTERNAL FIXATION LEG Left 02/22/2017   Procedure: EXTERNAL FIXATION LEFT ANKLE;  Surgeon: Renette Butters, MD;  Location: [place];  Service: Orthopedics;  Laterality: Left;  . HEMORRHOID BANDING  1970s  . INGUINAL HERNIA REPAIR Left 1962  . JOINT REPLACEMENT    . [place]   "cut me open"  . POSTERIOR FUSION CERVICAL SPINE  2006  . TEE WITHOUT CARDIOVERSION N/A 03/30/2015   Procedure: TRANSESOPHAGEAL ECHOCARDIOGRAM (TEE);  Surgeon: Grace Isaac, MD;  Location: [place];  Service: Open Heart Surgery;  Laterality: N/A;  . TESTICLE TORSION REDUCTION Right 1960  . TOE FUSION Left 1985 & 2004   "great toe"  . TONSILLECTOMY    . TOTAL HIP ARTHROPLASTY Right 10/27/2015   Procedure: RIGHT TOTAL HIP ARTHROPLASTY ANTERIOR APPROACH and steroid injection right foot;  Surgeon: Mcarthur Rossetti, MD;  Location: [place];  Service: Orthopedics;  Laterality: Right;    There were no vitals filed for this visit.  Subjective Assessment - 07/20/17 0745    Subjective  I am walking in the community.  I use shopping cart for balance.  I walked a at least a mile yesterday.  I did not not use valet parking. I am doing the exercises Richardson Landry gave me.  No pain.  My swelling is doing good.   Wears boot for all walking.    Currently in Pain?  No/denies    Pain Location  Ankle    Pain Orientation  Left    Aggravating Factors   stretching for motion at home    Pain Relieving Factors  cold,  elevation,  rest    Effect of Pain on Daily Activities  walker / boot take extra time.                        [place] Adult PT Treatment/Exercise - 07/20/17 0001      Ambulation/Gait   Gait Comments  SPC on level and steps,  4 and 6 inches with cane and rail,  cueds.  No pain with gait and SPC      Vasopneumatic   Number Minutes Vasopneumatic   15 minutes    Vasopnuematic Location   Ankle    Vasopneumatic Pressure  Medium     Vasopneumatic Temperature   34      Ankle Exercises: Supine   Other Supine Ankle Exercises  SLR 10 x      Ankle Exercises: Aerobic   Stationary Bike  Recumbant bike L4  6 minutes.              PT Education - 07/20/17 1412    Education provided  Yes    Education Details  gait training , proper cane height.  Methods  Explanation;Demonstration;Tactile cues;Verbal cues    Comprehension  Verbalized understanding;Returned demonstration       PT Short Term Goals - 07/20/17 1419      PT SHORT TERM GOAL #1   Title  He will be independnet with initial HEP     Baseline  he is doing at home without issue    Time  4    Period  Weeks    Status  Partially Met      PT SHORT TERM GOAL #2   Title  He will be able to walk WBAT with SPC in home    Baseline  able to do in clinic,  not yet doing at home.    Time  4    Period  Weeks    Status  On-going      PT SHORT TERM GOAL #3   Title  Swelling improved to 59 cm    Time  4    Status  Achieved        PT Long Term Goals - 06/20/17 0928      PT LONG TERM GOAL #1   Title  He will be independent with all HEP issued    Time  12    Period  Weeks    Status  New      PT LONG TERM GOAL #2   Title  He will be able to walk with least restricted device community distances    Time  12    Period  Weeks    Status  New      PT LONG TERM GOAL #3   Title  He will return to riding motorcycle if allowed by surgeon    Time  12    Period  Weeks    Status  New      PT LONG TERM GOAL #4   Title  He will report returning to all home tasks except more challenging activity like getting on roof.    Time  12    Period  Weeks    Status  New      PT LONG TERM GOAL #5   Title  swelling will improve to no more than 1 cm greater girth than RT    Time  12    Period  Weeks    Status  New      Additional Long Term Goals   Additional Long Term Goals  --      PT LONG TERM GOAL #6   Title  pain will be intermittant and no more than 1-2/10     Time  12    Period  Weeks    Status  New      PT LONG TERM GOAL #7   Title  FOTO score improved to 20% limited or less    Time  12    Period  Weeks    Status  New            Plan - 07/20/17 1415    Clinical Impression Statement  Patient is ready to progress to a cane.  he has several at home to choose from.  He had no increased pain with anything he did in the clinic today.  All gait peformed with the boot.  patient is able to ascend and descend with step over step with rail and extra time.  CGA for cane and no rail descending. ( last 2 steps) Edema continues to improve.  Progress toward gait goal.      PT Next Visit Plan  Vaso   SPC ambulation trial, manual AROM , weigth bearing activity. fot HEP  HEP  toe curls,  ?add  band standing hip 4 way to HEP.  continue rec bike -  ASSESS PAIN LEVEL treat based on this    PT Home Exercise Plan  edema management.  towel exercise, DF stretch, SLR 3 way , LAQ, knee flex seated green band    Consulted and Agree with Plan of Care  Patient       Patient will benefit from skilled therapeutic intervention in order to improve the following deficits and impairments:     Visit Diagnosis: Pain in left ankle and joints of left foot  Stiffness of left ankle, not elsewhere classified  Localized edema  Difficulty in walking, not elsewhere classified     Problem List Patient Active Problem List   Diagnosis Date Noted  . Pilon fracture 03/17/2017  . Pilon fracture of left tibia 03/17/2017  . At risk for adverse drug reaction 02/28/2017  . Closed fracture dislocation of ankle joint, left, with delayed healing, subsequent encounter 02/22/2017  . Renal stone 10/26/2016  . Gross hematuria 10/26/2016  . Bladder neck obstruction 10/26/2016  . External hemorrhoid, thrombosed 06/10/2016  . Internal hemorrhoids 06/10/2016  . Lumbar back pain 05/19/2016  . Osteoarthritis of right hip 10/27/2015  . Status post total replacement of right hip 10/27/2015   . Chronic chest pain 09/22/2015  . Acute gouty arthritis 06/11/2015  . CAD (coronary artery disease), native coronary artery 06/11/2015  . Chronic diastolic CHF (congestive heart failure) (H[facility]) 06/11/2015  . H/O tissue AVR Oct 2016 06/10/2015  . PAF post CABG/AVR- Amiodarone 06/10/2015  . Dyslipidemia 06/10/2015  . S/P CABG x 4-Oct 2016 03/30/2015  . H/O NSTEMI-Oct 2016 03/27/2015  . 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 Depression 09/08/2014  . GERD (gastroesophageal reflux disease)   . Essential tremor 03/31/2014  . Iliotibial band syndrome affecting left lower leg 01/01/2014  . Spondylolisthesis at L4-L5 level 01/01/2014  . Sinus bradycardia 12/31/2013  . Greater trochanteric bursitis of right hip 12/04/2013  . OSA on CPAP   . Diet-controlled diabetes mellitus ([facility]) 07/08/2009  . COLONIC POLYPS, HX OF 12/31/2008  . GOUT 12/22/2008  . Essential hypertension 12/22/2008  . Diverticulosis of colon 12/22/2008  . NEPHROLITHIASIS, HX OF 12/22/2008    HARRIS,KAREN PTA 07/20/2017, 2:21 PM  [facility] [address] [place], [place], [address] Phone: [phone]   Fax:  [phone]  Name: Antawan D Hartline MRN: [id number] Date of Birth: 03/30/1941   

## 2017-07-21 ENCOUNTER — Other Ambulatory Visit: Payer: Self-pay | Admitting: Cardiology

## 2017-07-21 NOTE — Progress Notes (Signed)
Subjective:   Troy Banks was seen in consultation in the movement disorder clinic at the request of [name], MD.  The evaluation is for tremor.  The records that were made available to me were reviewed.  Pt is accompanied by his significant other who supplements the hx.    The patient is a 77 y.o. left handed male with a history of tremor.  Pt reports tremor for at least 30 years.  Pt has noted increasing tremor over the years. Tremor is mostly in the right hand, but it is in the left as well and more rarely in the head as well.  Stress brings out the tremor.   He has never been to a neurologist for tremor.   Is currently on small amount of metoprolol - 25 mg - but that cannot be increased due to hx of bradycardia.  There is a family hx of tremor in his mother, maternal GM, maternal sister and maternal cousins.    Affected by caffeine:  No. (only drinks 2 cups coffee per week; drinks green tea) Affected by alcohol:  Used to decrease tremor but doesn't seem to any longer Affected by stress:  Yes.   Affected by fatigue:  Yes.   Spills soup if on spoon:  No. but has trouble with corn/peas Spills glass of liquid if full:  Yes.   Affects ADL's (tying shoes, brushing teeth, etc):  Yes.   (slow to tie shoes)  06/30/14 update:  Last visit, we started the patient on primidone for his tremor.  He cannot take Topamax because of history of nephrolithiasis or beta blockers because of bradycardia.  The patient reports that he is overall doing well.  For the first time in years, he was able to stand up and put on his jeans as he thinks that the medication helped his balance as well as tremor.  He has good and bad days in terms of tremor.  He has no SE in terms of tremor.  He states though that his metoprolol has been cut back since last visit because of bradycardia.    10/08/14 update:  Pt is on primidone '50mg'$  bid.  Pt states that initially it seemed helpful but nowl having tremor; had an episode  last week where couldn't shave because of tremor (not unusual to see tremor while shaving but almost unable to shave) and then whole body started shaking.  Lasted all day.  He had more word finding issues that day (has baseline word finding issues).  States that "I am concerned about something beyond familial tremor."  He is worried about PD.  Having cramping in the fingers and sometimes in the toes. Hands/fingers more than the feet.  He notices that his right arm "jerks", but thinks that that is due to "a botched neck surgery."  No significant issues with swallowing.  No diplopia.  10/30/14 update:  The patient is seen today, accompanied by his wife who supplements the history.  He had an MRI of the brain on 10/24/2014 that was unremarkable with exception of mild small vessel disease.  He had an MRI of the cervical spine demonstrating stenosis at several levels, but especially at the C6-C7 level and C3-C4 levels.  There was questionable thyroid enlargement and he was asked to follow up with his primary care physician in that regard.  On 10/28/2014 he underwent an EMG at our office.  This suggested chronic on active changes and intraspinal lesion at the right C6 level  and right S1 levels.  The etiology of this is unknown, but it could be anywhere from compressive etiologies to degeneration of the anterior horn cells.  He does not meet criteria for motor neuron disease at this point in time.  In regards to tremor, he has a few what he calls "episodes" in which his arms or legs will tremor more and may last 45 seconds.  BS was normal.  No known stress sets them off.  He may have a stuttering speech when it happens.    03/17/15 update:  The patient presents for follow-up today.  He is on primidone, 50 mg bid for essential tremor. Tremor has picked up but mostly with stress.  He thinks that we need to increase the primidone.   He is not a candidate for Topamax because of nephrolithiasis and is not a candidate for  beta blockers because of bradycardia.  We have worked him up extensively because of fasciculations.   No falls but "my balance is way off."   He had an MRI of the lumbar spine after we last met.  This was done on June 7.  There was evidence of severe facet arthrosis at the L4-L5 level and L5-S1 levels.  I told him that these findings do not explain all of his findings, including fasciculations.  He had an EEG on June 2 that was unremarkable.  This was done because of episodes of transient alteration of awareness that his wife had described.  His wife states that they didn't do the prolonged EEG because his insurance wouldn't allow it without a significant copay.  They are planning on r/s soon.  07/21/15 update:  The patient is seen today in follow-up, accompanied by his wife who supplements the history.  Much has happened since our last visit and I did review records.  Last visit, I increased his primidone to 100 mg in the morning and 50 mg at night.  He ended up being hospitalized in October, 2016 with an NSTEMI and ultimately required a coronary artery bypass graft and aortic valve replacement on 03/30/2015.  While in the hospital for this, he developed atrial fibrillation and was started on amiodarone.  Not surprisingly, this caused a significant increase in tremor.  He was rehospitalized in January, 2017 for acute gout and while he was in there he mentioned that tremor had increased.  His amiodarone was stopped.  He did have a repeat EMG since our last visit and this looked much better, without evidence of motor neuron disease.  He does feel that his balance has been a bit off.  Did not do cardiac rehab after CABG.    12/18/15 update:  The patient is seen today in follow-up.  He is accompanied by his wife who supplements the history.  I have reviewed prior records made available to me.  He remains on primidone, 100 mg in the morning and 50 mg at night. Been after amiodarone for 7 months and at the beginning of  May, he felt that tremor was much better but after hip surgery, felt that he regressed.  Surgery done under spinal anesthesia.  He had a total hip replacement on 10/27/2015.  Only did a week of therapy after that.  Felt that balance and tremor was worse after surgery.  Been losing weight through exercise.  Was put on synthroid 3 months ago.  04/19/16 update:  Pt f/u today accompanied by his wife who supplements the hx.  On primidone for ET,100 mg in  the AM/50 mg at night.  Has good days and bad days with tremor.  Reviewed Dr. Doug Sou notes.  Still off of amiodarone.  Is planning on selling home in spring.  Planning on moving to [place], [place].  Lost weight with lifestyle changes.  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  Mood much better now that back at work 5 days a week.  10/17/16 update:  Patient seen today in follow-up, accompanied by his wife who supplements the history.  The patient remains on primidone, 100 mg bid which he had increased since last visit.  He states that helped and "you can actually read my signature."  Biggest issue is low energy and he thinks that is related to his heart but he doesn't want to see the cardiologist.  He is too scared he would need another surgery.  Having MS chest pain over the scar.    Carotid ultrasound on 04/19/2016 was normal, with 1-39% stenosis bilaterally.  Little more imbalance.  Had to go on prednisone for gout.  Hasn't had A1C checked in a year.  Rarely checks BS at home - not checked in 3 weeks and "I know its high."    02/22/17 update:  Pt seen in f/u for ET.    The records that were made available to me were reviewed.  Had cystoscopy and lithotripsy since last visit.  After that tremor increased.  Called here and we increased his primidone to 150 in the AM and continued 100 mg at night.  He notes that stress makes it worse as does eating sugar.  States that he thinks that this is what he is taking but wife does his medication so he isn't  positive about that.  He is c/o loss of balance.  Is prediabetic.  Is not eating sugary foods.  Having subacute CP.  Worried about that.    07/25/17 update: Patient is seen today in follow-up for essential tremor.  I have reviewed records made available to me.  Patient is currently on 150 mg of primidone in the morning and 100 mg at night.  He reports that tremor picked up a bit after he was exposed to anesthesia.  Records have been reviewed since last visit.  Has been in the hospital a few times since our last visit.  Was in the hospital in September as he fell off a roof and landed on the left ankle.  He underwent external fixation on February 22, 2017 but could not have ankle fusion at the time because of significant swelling.  He was also found to have an acute L2 compression fracture.  He was discharged to a nursing facility for skilled rehab at [facility].  He liked therapy there but otherwise didn't care for the facility.  He returned to the hospital in October for fusion of the left ankle and external fixator pin site debridement.  Patient has been faithfully attending physical therapy.  He f/u 3/12 and he hopes to get rid of boot then.  Current/Previously tried tremor medications: neurontin - was up to 300 mg bid for rotator cuff issues but never seemed to help tremor; back on it at low dose for hip pain.    Outside reports reviewed: historical medical records and referral letter/letters.  Allergies  Allergen Reactions  . Benzodiazepines Other (See Comments)    Patient states he gets stroke symptoms with benzo's.   . Coreg [Carvedilol] Other (See Comments)    Accumulates and causes stroke-like side-effects  due to 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 Patient poor metabolizer of CYP2D6 - Coreg undergoes extensive hepatic (including 2D6)  . Lisinopril Other (See Comments)    Hyperkalemia. 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  . Mobic  [Meloxicam] Other (See Comments)    Accumulates and causes stroke-like side-effects due to 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  . Sertraline Hcl Other (See Comments)    Accumulates and causes stroke-like side-effects due to 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  . Tramadol Other (See Comments)    Accumulates and causes stroke-like side-effects due to 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  . Atorvastatin Other (See Comments)    MYALGIAS  . Colchicine Other (See Comments)    ataxia  . Compazine [Prochlorperazine Maleate] Other (See Comments)    Accumulates and causes stroke-like side-effects due to 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  . Flomax [Tamsulosin Hcl]     Accumulates and causes stroke-like side-effects due to 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  . Ondansetron Other (See Comments)    Accumulates and causes stroke-like side-effects due to 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   . Promethazine Other (See Comments)    Accumulates and causes stroke-like side-effects due to 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  . Acyclovir And Related Other (See Comments)    Accumulates and causes stroke-like side-effects due to 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    Outpatient Encounter Medications as of 07/25/2017  Medication Sig  . acetaminophen (TYLENOL) 500 MG tablet Take 2 tablets (1,000 mg total) by mouth every 6 (six) hours.  [name] allopurinol (ZYLOPRIM) 300 MG tablet Take 300 mg by mouth daily. (0900)  . amLODipine (NORVASC) 10 MG tablet TAKE 1 TABLET BY MOUTH EVERY DAY  . Ascorbic Acid (VITAMIN C) 1000 MG tablet Take 1,000 mg by mouth 2 (two) times daily. (0900 & 2100)  . aspirin 81 MG chewable tablet Chew 81 mg by mouth daily. (0900)  . Carboxymethylcellulose Sodium (LUBRICANT EYE DROPS OP) Place 1 drop into both eyes at bedtime.  . cholecalciferol (VITAMIN D) 1000 units tablet Take 1,000 Units by mouth 2 (two) times daily. (0900 & 2100)  . furosemide (LASIX) 40 MG tablet TAKE 1 TABLET BY MOUTH EVERY OTHER DAY  .  levothyroxine (SYNTHROID, LEVOTHROID) 25 MCG tablet TAKE 1 TABLET BY MOUTH EVERY DAY BEFORE BREAKFAST  . polyethylene glycol (MIRALAX / GLYCOLAX) packet Take 17 g by mouth daily.  . primidone (MYSOLINE) 50 MG tablet TAKE 3 TABLETS BY MOUTH IN THE MORNING AND TAKE 2 TABLETS BY MOUTH IN THE EVENING  . ranitidine (ZANTAC) 150 MG tablet TAKE 1 TABLET BY MOUTH 2 TIMES DAILY  . rosuvastatin (CRESTOR) 10 MG tablet TAKE 1/2 TABLET BY MOUTH DAILY  . tiZANidine (ZANAFLEX) 4 MG tablet Take 1 tablet (4 mg total) by mouth every 8 (eight) hours as needed for muscle spasms.  . Turmeric 500 MG CAPS Take 500 mg by mouth 2 (two) times daily. (0900 & 2100)  . vitamin B-12 (CYANOCOBALAMIN) 1000 MCG tablet Take 1,000 mcg by mouth 2 (two) times daily. (0900 & 2100)  . [DISCONTINUED] bisacodyl (DULCOLAX) 10 MG suppository Place 10 mg rectally daily as needed for moderate constipation (for constipation not relieved by milk of magnesia).  . [DISCONTINUED] HYDROmorphone (DILAUDID) 2 MG tablet Take 1 tablet (2 mg total) by mouth every 6 (six) hours as needed for moderate pain or severe pain.  . [DISCONTINUED] magnesium hydroxide (MILK OF MAGNESIA) 400 MG/5ML suspension Take 30 mLs by mouth daily as needed for mild constipation (for constipation (IF NO BOWEL MOVEMENT  IN 3 DAYS)).  . [DISCONTINUED] pregabalin (LYRICA) 75 MG capsule Take 75 mg by mouth 2 (two) times daily.  . [DISCONTINUED] Sodium Phosphates (RA SALINE ENEMA) 19-7 GM/118ML ENEM Place 1 Bottle rectally daily as needed (for constipation not relieved by bisacodyl suppository).   No facility-administered encounter medications on file as of 07/25/2017.     Past Medical History:  Diagnosis Date  . Allergy   . Anxiety    PTSD  . Aortic stenosis, moderate 03/27/2015   post AVR echo Echo 11/16: Mild LVH, EF 55-60%, normal wall motion, grade 1 diastolic dysfunction, bioprosthetic AVR okay (mean gradient 18 mmHg) with trivial AI, mild LAE, atrial septal aneurysm,  small effusion   . Arthritis    "qwhere" (10/29/2015)  . Carotid artery stenosis    Carotid US 11/17: 1-39% bilateral ICA stenosis. F/u prn  . Cervical stenosis of spinal canal    fusion 2006  . CHF (congestive heart failure) ([place])    "secondry to OHS"  . Closed left pilon fracture   . COLONIC POLYPS, HX OF   . Complication of anesthesia    "takes a lot to put him under and has woken up during surgery" Difficult to wake up . PTSD; "does not metabolize RX well"Pt. reports that he gets "violent", per pt.   . Diabetes mellitus without complication (HCC)    diet controlled  . DIVERTICULOSIS, COLON   . Dysrhythmia    2 bouts of afib post op and at home but corrected with medication.  . Familial tremor 03/19/2014  . GERD (gastroesophageal reflux disease)    uses Zantac  . GOUT   . Headache   . Heart murmur   . High cholesterol   . HYPERTENSION    off ACEI 2010 because of hyperkalemia in setting of ARI  . Hypothyroidism    newly diaganosed 09/2015  . Kidney stones    remote open stone-retrieval on L  . Myocardial infarction [facility])    October 20th 2016  . OSA on CPAP   . 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 PTSD (post-traumatic stress disorder)   . Sleep apnea    On a CPAP    Past Surgical History:  Procedure Laterality Date  . ANKLE FUSION Left 03/17/2017   Procedure: FUSION  PILON FRACTURE WITH BONE GRAFTING;  Surgeon: Shona Needles, MD;  Location: [facility];  Service: Orthopedics;  Laterality: Left;  . ANTERIOR FUSION CERVICAL SPINE  2002  . AORTIC VALVE REPLACEMENT N/A 03/30/2015   Procedure: AORTIC VALVE REPLACEMENT (AVR);  Surgeon: Grace Isaac, MD;  Location: [facility];  Service: Open Heart Surgery;  Laterality: N/A;  . BACK SURGERY    . CARDIAC CATHETERIZATION N/A 03/26/2015   Procedure: Left Heart Cath and Coronary Angiography;  Surgeon: Leonie Man, MD;  Location: [facility];  Service:  Cardiovascular;  Laterality: N/A;  . CARDIAC VALVE REPLACEMENT    . CORONARY ARTERY BYPASS GRAFT N/A 03/30/2015   Procedure: CORONARY ARTERY BYPASS GRAFTING (CABG) x four, using left internal mammary artery and left    leg greater saphenous vein harvested endoscopically ;  Surgeon: Grace Isaac, MD;  Location: [facility];  Service: Open Heart Surgery;  Laterality: N/A;  . CYSTOSCOPY/URETEROSCOPY/HOLMIUM LASER/STENT PLACEMENT Bilateral 11/07/2016   Procedure: CYSTOSCOPY/URETEROSCOPY/HOLMIUM LASER/STENT PLACEMENT;  Surgeon: Nickie Retort, MD;  Location: [facility];  Service: Urology;  Laterality: Bilateral;  . CYSTOSCOPY/URETEROSCOPY/HOLMIUM LASER/STENT PLACEMENT  Bilateral 11/23/2016   Procedure: CYSTOSCOPY/BILATERAL URETEROSCOPY/HOLMIUM LASER/STENT EXCHANGE;  Surgeon: Nickie Retort, MD;  Location: [facility];  Service: Urology;  Laterality: Bilateral;  NEEDS 90 MIN   . EXTERNAL FIXATION LEG Left 02/22/2017   Procedure: EXTERNAL FIXATION LEFT ANKLE;  Surgeon: Renette Butters, MD;  Location: [facility];  Service: Orthopedics;  Laterality: Left;  . HEMORRHOID BANDING  1970s  . INGUINAL HERNIA REPAIR Left 1962  . JOINT REPLACEMENT    . [place]   "cut me open"  . POSTERIOR FUSION CERVICAL SPINE  2006  . TEE WITHOUT CARDIOVERSION N/A 03/30/2015   Procedure: TRANSESOPHAGEAL ECHOCARDIOGRAM (TEE);  Surgeon: Grace Isaac, MD;  Location: [facility];  Service: Open Heart Surgery;  Laterality: N/A;  . TESTICLE TORSION REDUCTION Right 1960  . TOE FUSION Left 1985 & 2004   "great toe"  . TONSILLECTOMY    . TOTAL HIP ARTHROPLASTY Right 10/27/2015   Procedure: RIGHT TOTAL HIP ARTHROPLASTY ANTERIOR APPROACH and steroid injection right foot;  Surgeon: Mcarthur Rossetti, MD;  Location: [facility];  Service: Orthopedics;  Laterality: Right;    Social History   Socioeconomic History  . Marital status: Single    Spouse name: Karmen Bongo, RN  . Number of children: 2  . Years of education:  Not on file  . Highest education level: Not on file  Social Needs  . Financial resource strain: Not on file  . Food insecurity - worry: Not on file  . Food insecurity - inability: Not on file  . Transportation needs - medical: Not on file  . Transportation needs - non-medical: Not on file  Occupational History  . Occupation: Retired Animal nutritionist  Tobacco Use  . Smoking status: Former Smoker    Packs/day: 1.00    Years: 34.00    Pack years: 34.00    Types: Cigarettes    Last attempt to quit: 06/06/1990    Years since quitting: 27.1  . Smokeless tobacco: Never Used  . Tobacco comment: widowed 1991 - lives with SO [name] who is RN  Substance and Sexual Activity  . Alcohol use: Yes    Alcohol/week: 0.6 oz    Types: 1 Glasses of wine per week    Comment: occasional  . Drug use: No  . Sexual activity: Yes  Other Topics Concern  . Not on file  Social History Narrative  . Not on file    Family Status  Relation Name Status  . Mother  Deceased       Heart Disease, tremor  . Father  Deceased       Heart Disease  . Sister  Deceased       tremor  . Brother  (Not Specified)       unknown  . Brother  Alive       healthy  . Son  Alive       healthy  . Mat Uncle  (Not Specified)  . MGM  (Not Specified)  . MGF  (Not Specified)  . PGF  (Not Specified)  . Other  (Not Specified)  . Neg Hx  (Not Specified)    Review of Systems A complete 10 system ROS was obtained and was negative apart from what is mentioned.   Objective:   VITALS:   Vitals:   07/25/17 0813  BP: 128/80  Pulse: (!) 54  SpO2: 96%  Weight: 205 lb (93 kg)  Height: '5\' 8"'$  (1.727 m)   Wt Readings  from Last 3 Encounters:  07/25/17 205 lb (93 kg)  05/01/17 189 lb (85.7 kg)  02/28/17 203 lb 0.7 oz (92.1 kg)    Gen:  Appears stated age and in NAD. HEENT:  Normocephalic, atraumatic. The mucous membranes are moist. The superficial temporal arteries are without ropiness or tenderness.  No tongue  fasciculations. Cardiovascular: bradycardic.  Regular with 2/6 SEM Lungs: Clear to auscultation bilaterally. Neck: There is L carotid bruit that is louder than the R  NEUROLOGICAL:  Orientation:  The patient is alert and oriented x 3.  Cranial nerves: There is good facial symmetry. Soft palate rises symmetrically and there is no tongue deviation. Hearing is intact to conversational tone. Tone: Tone is good throughout. Sensation: Sensation is intact to light touch throughout. Coordination:  The patient has no dysdiadichokinesia or dysmetria. Motor: Strength is 5/5 in the bilateral upper and lower extremities.  Shoulder shrug is equal bilaterally.  There is no pronator drift.   Gait and Station: The patient ambulates with an antalgic gait (boot on the L)  MOVEMENT EXAM: Tremor:  There is head tremor in the "yes."  There is mild tremor of the outstretched hand that increases with intention.  Labs:    Chemistry      Component Value Date/Time   NA 134 (L) 03/19/2017 0518   K 3.7 03/19/2017 0518   CL 99 (L) 03/19/2017 0518   CO2 29 03/19/2017 0518   BUN 19 03/19/2017 0518   CREATININE 0.80 03/19/2017 0518   CREATININE 0.94 03/04/2016 0918      Component Value Date/Time   CALCIUM 8.8 (L) 03/19/2017 0518   ALKPHOS 42 10/26/2016 0935   AST 14 10/26/2016 0935   ALT 17 10/26/2016 0935   BILITOT 0.6 10/26/2016 0935     Lab Results  Component Value Date   HGBA1C 5.9 10/26/2016   Lab Results  Component Value Date   WBC 10.2 03/19/2017   HGB 11.9 (L) 03/19/2017   HCT 36.9 (L) 03/19/2017   MCV 89.1 03/19/2017   PLT 178 03/19/2017   Lab Results  Component Value Date   TSH 2.96 10/26/2016        Assessment/Plan:   1.  Essential Tremor.  -This is evidenced by the symmetrical nature and longstanding hx of gradually getting worse and family hx.  -he will continue primidone, 150 mg in the AM/100 mg at night.  He just quit his job on Friday and thinks that the lowered stress  will help with the lowering of tremor.   Previously contacted pharmacy previously due to his hx of cytochrome P450 problems but told that no interaction and not an inducer of this system.  Had increased tremor previously with amiodarone.    Risks, benefits, side effects and alternative therapies were discussed.  The opportunity to ask questions was given and they were answered to the best of my ability.  The patient expressed understanding and willingness to follow the outlined treatment protocols.  -Long discussion about DBS surgery today.  He is not interested but will continue to think about that.    -Not a candidate for topamax due to current nephrolithiasis and hx of same.  Not able to increase beta blocker due to h/o bradycardia.  May be able to push up neurontin but would need to push up to much higher dose.  2.  Dizziness/fatigue/balance change  -wonder if related to high blood sugars and possible diabetic peripheral neuropathy.  Told him needed to check blood sugars.  Told  him needed to make appt with PCP for yearly physical and blood work.  Also told him he needed to make a follow up appt with Dr. [name].    -Had a fall from a roof in September, 2018 after getting on the roof while in slippers trying to clean the gutters.  Talked about safety and this patient should not be on his roof under any condition. 3.  Fasciculations  -repeat EMG in 05/2015 much improved without evidence of MND  -The patient does have a compressive lesion in the cervical spine, but I'm not sure that that explains all of his symptoms.   4.  L carotid bruit  -Carotid ultrasound on 04/19/2016 was normal, with 1-39% stenosis bilaterally. 5.  Episodes of tremulousness, accompanied by potential speech change and ataxia according to his wife  - Routine EEG was negative and no further spells. 6. Fall from roof in 02/2017  -long counseling session about safety.   7.  Follow up is anticipated in the next few months, sooner  should new neurologic issues arise.  Much greater than 50% of this visit was spent in counseling and coordinating care.  Total face to face time:  25 min

## 2017-07-21 NOTE — Telephone Encounter (Signed)
REFILL 

## 2017-07-25 ENCOUNTER — Ambulatory Visit (INDEPENDENT_AMBULATORY_CARE_PROVIDER_SITE_OTHER): Payer: Medicare Other | Admitting: Neurology

## 2017-07-25 ENCOUNTER — Encounter: Payer: Self-pay | Admitting: Neurology

## 2017-07-25 ENCOUNTER — Ambulatory Visit: Payer: Medicare Other

## 2017-07-25 VITALS — BP 128/80 | HR 54 | Ht 68.0 in | Wt 205.0 lb

## 2017-07-25 DIAGNOSIS — R262 Difficulty in walking, not elsewhere classified: Secondary | ICD-10-CM | POA: Diagnosis not present

## 2017-07-25 DIAGNOSIS — R600 Localized edema: Secondary | ICD-10-CM

## 2017-07-25 DIAGNOSIS — G250 Essential tremor: Secondary | ICD-10-CM

## 2017-07-25 DIAGNOSIS — W19XXXD Unspecified fall, subsequent encounter: Secondary | ICD-10-CM | POA: Diagnosis not present

## 2017-07-25 DIAGNOSIS — M25572 Pain in left ankle and joints of left foot: Secondary | ICD-10-CM | POA: Diagnosis not present

## 2017-07-25 DIAGNOSIS — M25672 Stiffness of left ankle, not elsewhere classified: Secondary | ICD-10-CM

## 2017-07-25 MED ORDER — PRIMIDONE 50 MG PO TABS: ORAL_TABLET | ORAL | 1 refills | Status: DC

## 2017-07-25 NOTE — Therapy (Signed)
[place], [place], [address] Phone: [phone]   Fax:  [phone]  Physical Therapy Treatment  Patient Details  Name: Troy Banks MRN: [id number] Date of Birth: 09/27/1940 Referring Provider: Katha Hamming, MD   Encounter Date: 07/25/2017  PT End of Session - 07/25/17 1007    Visit Number  11    Number of Visits  24    Date for PT Re-Evaluation  09/08/17    Authorization Type  MEDICARE    Authorization Time Period  KX at visit 15    PT Start Time  1010    PT Stop Time  1100    PT Time Calculation (min)  50 min    Activity Tolerance  Patient tolerated treatment well    Behavior During Therapy  [facility] for tasks assessed/performed       Past Medical History:  Diagnosis Date  . Allergy   . Anxiety    PTSD  . Aortic stenosis, moderate 03/27/2015   post AVR echo Echo 11/16: Mild LVH, EF 55-60%, normal wall motion, grade 1 diastolic dysfunction, bioprosthetic AVR okay (mean gradient 18 mmHg) with trivial AI, mild LAE, atrial septal aneurysm, small effusion   . Arthritis    "qwhere" (10/29/2015)  . Carotid artery stenosis    Carotid US 11/17: 1-39% bilateral ICA stenosis. F/u prn  . Cervical stenosis of spinal canal    fusion 2006  . CHF (congestive heart failure) ([place])    "secondry to OHS"  . Closed left pilon fracture   . COLONIC POLYPS, HX OF   . Complication of anesthesia    "takes a lot to put him under and has woken up during surgery" Difficult to wake up . PTSD; "does not metabolize RX well"Pt. reports that he gets "violent", per pt.   . Diabetes mellitus without complication (HCC)    diet controlled  . DIVERTICULOSIS, COLON   . Dysrhythmia    2 bouts of afib post op and at home but corrected with medication.  . Familial tremor 03/19/2014  . GERD (gastroesophageal reflux disease)    uses Zantac  . GOUT   . Headache   . Heart murmur   . High cholesterol   . HYPERTENSION    off ACEI  2010 because of hyperkalemia in setting of ARI  . Hypothyroidism    newly diaganosed 09/2015  . Kidney stones    remote open stone-retrieval on L  . Myocardial infarction [facility])    October 20th 2016  . OSA on CPAP   . 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 PTSD (post-traumatic stress disorder)   . Sleep apnea    On a CPAP    Past Surgical History:  Procedure Laterality Date  . ANKLE FUSION Left 03/17/2017   Procedure: FUSION  PILON FRACTURE WITH BONE GRAFTING;  Surgeon: Shona Needles, MD;  Location: [place];  Service: Orthopedics;  Laterality: Left;  . ANTERIOR FUSION CERVICAL SPINE  2002  . AORTIC VALVE REPLACEMENT N/A 03/30/2015   Procedure: AORTIC VALVE REPLACEMENT (AVR);  Surgeon: Grace Isaac, MD;  Location: [place];  Service: Open Heart Surgery;  Laterality: N/A;  . BACK SURGERY    . CARDIAC CATHETERIZATION N/A 03/26/2015   Procedure: Left Heart Cath and Coronary Angiography;  Surgeon: Leonie Man, MD;  Location: [place] [facility];  Service: Cardiovascular;  Laterality: N/A;  . CARDIAC VALVE  REPLACEMENT    . CORONARY ARTERY BYPASS GRAFT N/A 03/30/2015   Procedure: CORONARY ARTERY BYPASS GRAFTING (CABG) x four, using left internal mammary artery and left    leg greater saphenous vein harvested endoscopically ;  Surgeon: Grace Isaac, MD;  Location: [place];  Service: Open Heart Surgery;  Laterality: N/A;  . CYSTOSCOPY/URETEROSCOPY/HOLMIUM LASER/STENT PLACEMENT Bilateral 11/07/2016   Procedure: CYSTOSCOPY/URETEROSCOPY/HOLMIUM LASER/STENT PLACEMENT;  Surgeon: Nickie Retort, MD;  [facility];  Service: Urology;  Laterality: Bilateral;  . CYSTOSCOPY/URETEROSCOPY/HOLMIUM LASER/STENT PLACEMENT Bilateral 11/23/2016   Procedure: CYSTOSCOPY/BILATERAL URETEROSCOPY/HOLMIUM LASER/STENT EXCHANGE;  Surgeon: Nickie Retort, MD;  [facility];  Service: Urology;  Laterality: Bilateral;  NEEDS 90 MIN    . EXTERNAL FIXATION LEG Left 02/22/2017   Procedure: EXTERNAL FIXATION LEFT ANKLE;  Surgeon: Renette Butters, MD;  Location: [place];  Service: Orthopedics;  Laterality: Left;  . HEMORRHOID BANDING  1970s  . INGUINAL HERNIA REPAIR Left 1962  . JOINT REPLACEMENT    . [place]   "cut me open"  . POSTERIOR FUSION CERVICAL SPINE  2006  . TEE WITHOUT CARDIOVERSION N/A 03/30/2015   Procedure: TRANSESOPHAGEAL ECHOCARDIOGRAM (TEE);  Surgeon: Grace Isaac, MD;  Location: [place];  Service: Open Heart Surgery;  Laterality: N/A;  . TESTICLE TORSION REDUCTION Right 1960  . TOE FUSION Left 1985 & 2004   "great toe"  . TONSILLECTOMY    . TOTAL HIP ARTHROPLASTY Right 10/27/2015   Procedure: RIGHT TOTAL HIP ARTHROPLASTY ANTERIOR APPROACH and steroid injection right foot;  Surgeon: Mcarthur Rossetti, MD;  Location: [place];  Service: Orthopedics;  Laterality: Right;    There were no vitals filed for this visit.  Subjective Assessment - 07/25/17 1024    Subjective  Using SPc with boot in home.   MD 08/16/15.  No pain    Currently in Pain?  No/denies         [facility] PT Assessment - 07/25/17 0001      Observation/Other Assessments   Focus on Therapeutic Outcomes (FOTO)   37% limiited  decr 17%                  [facility] Adult PT Treatment/Exercise - 07/25/17 0001      Ankle Exercises: Aerobic   Stationary Bike  L4 5 min        Active ROM all planes x5 followed by resisted all planes x 15 reps  Manual ROM all planes , joint mos and ROM toes and forefoot.      PT Short Term Goals - 07/25/17 1017      PT SHORT TERM GOAL #1   Title  He will be independent with initial HEP     Status  Achieved      PT SHORT TERM GOAL #2   Title  He will be able to walk WBAT with SPC in home    Status  Achieved      PT SHORT TERM GOAL #3   Title  Swelling improved to 59 cm    Status  Achieved        PT Long Term Goals - 07/25/17 1135      PT LONG TERM GOAL #1    Title  He will be independent with all HEP issued    Status  On-going      PT LONG TERM GOAL #2   Title  He will be able to walk with least restricted device community distances  Status  Partially Met      PT LONG TERM GOAL #3   Title  He will return to riding motorcycle if allowed by surgeon    Status  On-going      PT LONG TERM GOAL #4   Title  He will report returning to all home tasks except more challenging activity like getting on roof.    Status  On-going      PT LONG TERM GOAL #5   Title  swelling will improve to no more than 1 cm greater girth than RT    Status  On-going      PT LONG TERM GOAL #6   Title  pain will be intermittant and no more than 1-2/10    Status  Achieved      PT LONG TERM GOAL #7   Title  FOTO score improved to 20% limited or less    Status  On-going            Plan - 07/25/17 1008    Clinical Impression Statement  No pain with using cand for community ambulation. FOTO score significantly improved It appears his ROM is not changing and resitance is good in available ROM and walking has progressed to level allowed by boot so will put on hold until 3/7 for reassessment ant return to Md then plane on treating after if allowed out of boot to progress balance and ambulation   PT Treatment/Interventions  Cryotherapy;Vasopneumatic Device;Therapeutic exercise;Therapeutic activities;Gait training;Patient/family education;Passive range of motion;Manual techniques;Taping    PT Next Visit Plan  Vaso   SPC ambulation trial, manual AROM , weigth bearing activity. fot HEP  HEP  toe curls,  ?add  band standing hip 4 way to HEP.  continue rec bike -  ASSESS PAIN LEVEL treat based on this    PT Home Exercise Plan  edema management.  towel exercise, DF stretch, SLR 3 way , LAQ, knee flex seated green band    Consulted and Agree with Plan of Care  Patient       Patient will benefit from skilled therapeutic intervention in order to improve the following deficits  and impairments:  Pain, Decreased activity tolerance, Decreased balance, Decreased range of motion, Decreased strength, Increased edema, Difficulty walking  Visit Diagnosis: Pain in left ankle and joints of left foot  Stiffness of left ankle, not elsewhere classified  Localized edema  Difficulty in walking, not elsewhere classified     Problem List Patient Active Problem List   Diagnosis Date Noted  . Pilon fracture 03/17/2017  . Pilon fracture of left tibia 03/17/2017  . At risk for adverse drug reaction 02/28/2017  . Closed fracture dislocation of ankle joint, left, with delayed healing, subsequent encounter 02/22/2017  . Renal stone 10/26/2016  . Gross hematuria 10/26/2016  . Bladder neck obstruction 10/26/2016  . External hemorrhoid, thrombosed 06/10/2016  . Internal hemorrhoids 06/10/2016  . Lumbar back pain 05/19/2016  . Osteoarthritis of right hip 10/27/2015  . Status post total replacement of right hip 10/27/2015  . Chronic chest pain 09/22/2015  . Acute gouty arthritis 06/11/2015  . CAD (coronary artery disease), native coronary artery 06/11/2015  . Chronic diastolic CHF (congestive heart failure) ([place]) 06/11/2015  . H/O tissue AVR Oct 2016 06/10/2015  . PAF post CABG/AVR- Amiodarone 06/10/2015  . Dyslipidemia 06/10/2015  . S/P CABG x 4-Oct 2016 03/30/2015  . H/O NSTEMI-Oct 2016 03/27/2015  . 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 Depression  09/08/2014  . GERD (gastroesophageal reflux disease)   . Essential tremor 03/31/2014  . Iliotibial band syndrome affecting left lower leg 01/01/2014  . Spondylolisthesis at L4-L5 level 01/01/2014  . Sinus bradycardia 12/31/2013  . Greater trochanteric bursitis of right hip 12/04/2013  . OSA on CPAP   . Diet-controlled diabetes mellitus ([place]) 07/08/2009  . COLONIC POLYPS, HX OF 12/31/2008  . GOUT 12/22/2008  . Essential hypertension 12/22/2008  . Diverticulosis of colon 12/22/2008  .  NEPHROLITHIASIS, HX OF 12/22/2008    Troy Banks  PT 07/25/2017, 11:37 AM  [facility] [address] [place], [place], [address] Phone: [phone]   Fax:  [phone]  Name: ARVILLE POSTLEWAITE MRN: [id number] Date of Birth: November 29, 1940

## 2017-07-25 NOTE — Patient Instructions (Signed)
No getting on roofs or high ladders! Good to see you.  I will see you in 6 months.

## 2017-07-27 ENCOUNTER — Ambulatory Visit: Payer: Medicare Other | Admitting: Physical Therapy

## 2017-08-01 ENCOUNTER — Encounter: Payer: Medicare Other | Admitting: Physical Therapy

## 2017-08-08 ENCOUNTER — Encounter: Payer: Medicare Other | Admitting: Physical Therapy

## 2017-08-10 ENCOUNTER — Ambulatory Visit: Payer: Medicare Other | Attending: Student

## 2017-08-10 DIAGNOSIS — R600 Localized edema: Secondary | ICD-10-CM | POA: Diagnosis not present

## 2017-08-10 DIAGNOSIS — R262 Difficulty in walking, not elsewhere classified: Secondary | ICD-10-CM | POA: Diagnosis not present

## 2017-08-10 DIAGNOSIS — M25572 Pain in left ankle and joints of left foot: Secondary | ICD-10-CM | POA: Diagnosis not present

## 2017-08-10 DIAGNOSIS — M25672 Stiffness of left ankle, not elsewhere classified: Secondary | ICD-10-CM | POA: Diagnosis not present

## 2017-08-10 NOTE — Therapy (Signed)
[facility] [address] [place], [place], [address] Phone: [phone]   Fax:  [phone]  Physical Therapy Treatment  Patient Details  Name: Troy Banks MRN: [id number] Date of Birth: 12/19/1940 Referring Provider: Kevin Haddix, MD   Encounter Date: 08/10/2017  PT End of Session - 08/10/17 0757    Visit Number  12    Number of Visits  24    Date for PT Re-Evaluation  09/08/17    Authorization Type  MEDICARE    Authorization Time Period  KX at visit 15    PT Start Time  0800    PT Stop Time  0840    PT Time Calculation (min)  40 min    Activity Tolerance  Patient tolerated treatment well    Behavior During Therapy  W[facility] for tasks assessed/performed       Past Medical History:  Diagnosis Date  . Allergy   . Anxiety    PTSD  . Aortic stenosis, moderate 03/27/2015   post AVR echo Echo 11/16: Mild LVH, EF 55-60%, normal wall motion, grade 1 diastolic dysfunction, bioprosthetic AVR okay (mean gradient 18 mmHg) with trivial AI, mild LAE, atrial septal aneurysm, small effusion   . Arthritis    "qwhere" (10/29/2015)  . Carotid artery stenosis    Carotid US 11/17: 1-39% bilateral ICA stenosis. F/u prn  . Cervical stenosis of spinal canal    fusion 2006  . CHF (congestive heart failure) (H[facility])    "secondry to OHS"  . Closed left pilon fracture   . COLONIC POLYPS, HX OF   . Complication of anesthesia    "takes a lot to put him under and has woken up during surgery" Difficult to wake up . PTSD; "does not metabolize RX well"Pt. reports that he gets "violent", per pt.   . Diabetes mellitus without complication (HCC)    diet controlled  . DIVERTICULOSIS, COLON   . Dysrhythmia    2 bouts of afib post op and at home but corrected with medication.  . Familial tremor 03/19/2014  . GERD (gastroesophageal reflux disease)    uses Zantac  . GOUT   . Headache   . Heart murmur   . High cholesterol   . HYPERTENSION    off ACEI  2010 because of hyperkalemia in setting of ARI  . Hypothyroidism    newly diaganosed 09/2015  . Kidney stones    remote open stone-retrieval on L  . Myocardial infarction (HCC)    October 20th 2016  . OSA on CPAP   . 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 PTSD (post-traumatic stress disorder)   . Sleep apnea    On a CPAP    Past Surgical History:  Procedure Laterality Date  . ANKLE FUSION Left 03/17/2017   Procedure: FUSION  PILON FRACTURE WITH BONE GRAFTING;  Surgeon: Haddix, Kevin P, MD;  [facility];  Service: Orthopedics;  Laterality: Left;  . ANTERIOR FUSION CERVICAL SPINE  2002  . AORTIC VALVE REPLACEMENT N/A 03/30/2015   Procedure: AORTIC VALVE REPLACEMENT (AVR);  Surgeon: Edward B Gerhardt, MD;  [facility];  Service: Open Heart Surgery;  Laterality: N/A;  . BACK SURGERY    . CARDIAC CATHETERIZATION N/A 03/26/2015   Procedure: Left Heart Cath and Coronary Angiography;  Surgeon: David W Harding, MD;  [facility];  Service: Cardiovascular;  Laterality: N/A;  . CARDIAC VALVE   REPLACEMENT    . CORONARY ARTERY BYPASS GRAFT N/A 03/30/2015   Procedure: CORONARY ARTERY BYPASS GRAFTING (CABG) x four, using left internal mammary artery and left    leg greater saphenous vein harvested endoscopically ;  Surgeon: Grace Isaac, MD;  Location: [place];  Service: Open Heart Surgery;  Laterality: N/A;  . CYSTOSCOPY/URETEROSCOPY/HOLMIUM LASER/STENT PLACEMENT Bilateral 11/07/2016   Procedure: CYSTOSCOPY/URETEROSCOPY/HOLMIUM LASER/STENT PLACEMENT;  Surgeon: Nickie Retort, MD;  [facility];  Service: Urology;  Laterality: Bilateral;  . CYSTOSCOPY/URETEROSCOPY/HOLMIUM LASER/STENT PLACEMENT Bilateral 11/23/2016   Procedure: CYSTOSCOPY/BILATERAL URETEROSCOPY/HOLMIUM LASER/STENT EXCHANGE;  Surgeon: Nickie Retort, MD;  [facility];  Service: Urology;  Laterality: Bilateral;  NEEDS 90 MIN    . EXTERNAL FIXATION LEG Left 02/22/2017   Procedure: EXTERNAL FIXATION LEFT ANKLE;  Surgeon: Renette Butters, MD;  Location: [place];  Service: Orthopedics;  Laterality: Left;  . HEMORRHOID BANDING  1970s  . INGUINAL HERNIA REPAIR Left 1962  . JOINT REPLACEMENT    . [place]   "cut me open"  . POSTERIOR FUSION CERVICAL SPINE  2006  . TEE WITHOUT CARDIOVERSION N/A 03/30/2015   Procedure: TRANSESOPHAGEAL ECHOCARDIOGRAM (TEE);  Surgeon: Grace Isaac, MD;  Location: [place];  Service: Open Heart Surgery;  Laterality: N/A;  . TESTICLE TORSION REDUCTION Right 1960  . TOE FUSION Left 1985 & 2004   "great toe"  . TONSILLECTOMY    . TOTAL HIP ARTHROPLASTY Right 10/27/2015   Procedure: RIGHT TOTAL HIP ARTHROPLASTY ANTERIOR APPROACH and steroid injection right foot;  Surgeon: Mcarthur Rossetti, MD;  Location: [place];  Service: Orthopedics;  Laterality: Right;    There were no vitals filed for this visit.  Subjective Assessment - 08/10/17 0802    Subjective  Get cramping with toe exercises.   MD next week    Currently in Pain?  No/denies         [facility] PT Assessment - 08/10/17 0001      Figure 8 Edema   Figure 8 - Left   56 cm      AROM   Left Ankle Dorsiflexion  91    Left Ankle Plantar Flexion  109    Left Ankle Inversion  12    Left Ankle Eversion  10      Strength   Right Ankle Dorsiflexion  5/5    Right Ankle Plantar Flexion  5/5 manually in supine    Right Ankle Inversion  5/5    Right Ankle Eversion  5/5                  [facility] Adult PT Treatment/Exercise - 08/10/17 0001      Manual Therapy   Joint Mobilization  Foot and toes / ankle        Passive ROM  all planes and toes DF/PF      Ankle Exercises: Aerobic   Stationary Bike  L4 6 min      Ankle Exercises: Supine   Other Supine Ankle Exercises  resisted all motions                 PT Short Term Goals - 07/25/17 1017      PT SHORT TERM GOAL #1   Title  He will be  independent with initial HEP     Status  Achieved      PT SHORT TERM GOAL #2   Title  He will be able to walk WBAT with SPC in home  Status  Achieved      PT SHORT TERM GOAL #3   Title  Swelling improved to 59 cm    Status  Achieved        PT Long Term Goals - 08/10/17 0839      PT LONG TERM GOAL #1   Title  He will be independent with all HEP issued    Status  On-going      PT LONG TERM GOAL #2   Title  He will be able to walk with least restricted device community distances    Baseline  still in boot    Status  Partially Met      PT LONG TERM GOAL #3   Title  He will return to riding motorcycle if allowed by surgeon    Status  On-going      PT LONG TERM GOAL #4   Title  He will report returning to all home tasks except more challenging activity like getting on roof.    Status  On-going      PT LONG TERM GOAL #5   Title  swelling will improve to no more than 1 cm greater girth than RT    Baseline  56 cm today    Status  On-going      PT LONG TERM GOAL #6   Title  pain will be intermittant and no more than 1-2/10    Status  Achieved      PT LONG TERM GOAL #7   Title  FOTO score improved to 20% limited or less    Status  Unable to assess            Plan - 08/10/17 0757    Clinical Impression Statement  reports cramping of toes/foot so told him to stop toe exercise for now. ROM incr 2 degrees Lt ankle motions. Walking with SPC no pain and good pattern. Will progress as able per MD after visit next week.   It is unclear if more ROM is possible. .  no need for vaso at this point.    PT Treatment/Interventions  Cryotherapy;Vasopneumatic Device;Therapeutic exercise;Therapeutic activities;Gait training;Patient/family education;Passive range of motion;Manual techniques;Taping    PT Next Visit Plan    SPC ambulation , manual AROM , weigth bearing activity/ balance. ,  add  band standing hip 4 way to HEP.  continue rec bike -  FOTO NEXT    PT Home Exercise Plan  edema  management.  towel exercise, DF stretch, SLR 3 way , LAQ, knee flex seated green band    Consulted and Agree with Plan of Care  Patient       Patient will benefit from skilled therapeutic intervention in order to improve the following deficits and impairments:  Pain, Decreased activity tolerance, Decreased balance, Decreased range of motion, Decreased strength, Increased edema, Difficulty walking  Visit Diagnosis: Pain in left ankle and joints of left foot  Stiffness of left ankle, not elsewhere classified  Localized edema  Difficulty in walking, not elsewhere classified     Problem List Patient Active Problem List   Diagnosis Date Noted  . Pilon fracture 03/17/2017  . Pilon fracture of left tibia 03/17/2017  . At risk for adverse drug reaction 02/28/2017  . Closed fracture dislocation of ankle joint, left, with delayed healing, subsequent encounter 02/22/2017  . Renal stone 10/26/2016  . Gross hematuria 10/26/2016  . Bladder neck obstruction 10/26/2016  . External hemorrhoid, thrombosed 06/10/2016  . Internal hemorrhoids 06/10/2016  . Lumbar  back pain 05/19/2016  . Osteoarthritis of right hip 10/27/2015  . Status post total replacement of right hip 10/27/2015  . Chronic chest pain 09/22/2015  . Acute gouty arthritis 06/11/2015  . CAD (coronary artery disease), native coronary artery 06/11/2015  . Chronic diastolic CHF (congestive heart failure) ([place]) 06/11/2015  . H/O tissue AVR Oct 2016 06/10/2015  . PAF post CABG/AVR- Amiodarone 06/10/2015  . Dyslipidemia 06/10/2015  . S/P CABG x 4-Oct 2016 03/30/2015  . H/O NSTEMI-Oct 2016 03/27/2015  . 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 Depression 09/08/2014  . GERD (gastroesophageal reflux disease)   . Essential tremor 03/31/2014  . Iliotibial band syndrome affecting left lower leg 01/01/2014  . Spondylolisthesis at L4-L5 level 01/01/2014  . Sinus bradycardia 12/31/2013  . Greater trochanteric  bursitis of right hip 12/04/2013  . OSA on CPAP   . Diet-controlled diabetes mellitus ([place]) 07/08/2009  . COLONIC POLYPS, HX OF 12/31/2008  . GOUT 12/22/2008  . Essential hypertension 12/22/2008  . Diverticulosis of colon 12/22/2008  . NEPHROLITHIASIS, HX OF 12/22/2008    Darrel Hoover  PT 08/10/2017, 8:41 AM  [facility] [address] [place], [place], [address] Phone: [phone]   Fax:  [phone]  Name: Troy Banks MRN: [id number] Date of Birth: 04/13/1941

## 2017-08-15 ENCOUNTER — Encounter: Payer: Medicare Other | Admitting: Physical Therapy

## 2017-08-15 DIAGNOSIS — S82872D Displaced pilon fracture of left tibia, subsequent encounter for closed fracture with routine healing: Secondary | ICD-10-CM | POA: Diagnosis not present

## 2017-08-17 ENCOUNTER — Ambulatory Visit: Payer: Medicare Other

## 2017-08-17 DIAGNOSIS — R262 Difficulty in walking, not elsewhere classified: Secondary | ICD-10-CM

## 2017-08-17 DIAGNOSIS — M25572 Pain in left ankle and joints of left foot: Secondary | ICD-10-CM

## 2017-08-17 DIAGNOSIS — R600 Localized edema: Secondary | ICD-10-CM

## 2017-08-17 DIAGNOSIS — M25672 Stiffness of left ankle, not elsewhere classified: Secondary | ICD-10-CM | POA: Diagnosis not present

## 2017-08-17 NOTE — Therapy (Signed)
[place], [place], [address] Phone: [phone]   Fax:  [phone]  Physical Therapy Treatment  Patient Details  Name: Troy Banks MRN: [id number] Date of Birth: [birth year] Referring Provider: Katha Hamming, MD   Encounter Date: 08/17/2017  PT End of Session - 08/17/17 0839    Visit Number  13    Number of Visits  24    Date for PT Re-Evaluation  09/08/17    Authorization Type  MEDICARE    PT Start Time  0800    PT Stop Time  0845    PT Time Calculation (min)  45 min    Activity Tolerance  Patient tolerated treatment well    Behavior During Therapy  [facility] for tasks assessed/performed       Past Medical History:  Diagnosis Date  . Allergy   . Anxiety    PTSD  . Aortic stenosis, moderate 03/27/2015   post AVR echo Echo 11/16: Mild LVH, EF 55-60%, normal wall motion, grade 1 diastolic dysfunction, bioprosthetic AVR okay (mean gradient 18 mmHg) with trivial AI, mild LAE, atrial septal aneurysm, small effusion   . Arthritis    "qwhere" (10/29/2015)  . Carotid artery stenosis    Carotid US 11/17: 1-39% bilateral ICA stenosis. F/u prn  . Cervical stenosis of spinal canal    fusion 2006  . CHF (congestive heart failure) ([place])    "secondry to OHS"  . Closed left pilon fracture   . COLONIC POLYPS, HX OF   . Complication of anesthesia    "takes a lot to put him under and has woken up during surgery" Difficult to wake up . PTSD; "does not metabolize RX well"Pt. reports that he gets "violent", per pt.   . Diabetes mellitus without complication (HCC)    diet controlled  . DIVERTICULOSIS, COLON   . Dysrhythmia    2 bouts of afib post op and at home but corrected with medication.  . Familial tremor 03/19/2014  . GERD (gastroesophageal reflux disease)    uses Zantac  . GOUT   . Headache   . Heart murmur   . High cholesterol   . HYPERTENSION    off ACEI 2010 because of hyperkalemia in setting of  ARI  . Hypothyroidism    newly diaganosed 09/2015  . Kidney stones    remote open stone-retrieval on L  . Myocardial infarction [facility])    October 20th 2016  . OSA on CPAP   . 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 PTSD (post-traumatic stress disorder)   . Sleep apnea    On a CPAP    Past Surgical History:  Procedure Laterality Date  . ANKLE FUSION Left 03/17/2017   Procedure: FUSION  PILON FRACTURE WITH BONE GRAFTING;  Surgeon: Shona Needles, MD;  Location: [place];  Service: Orthopedics;  Laterality: Left;  . ANTERIOR FUSION CERVICAL SPINE  2002  . AORTIC VALVE REPLACEMENT N/A 03/30/2015   Procedure: AORTIC VALVE REPLACEMENT (AVR);  Surgeon: Grace Isaac, MD;  Location: [place];  Service: Open Heart Surgery;  Laterality: N/A;  . BACK SURGERY    . CARDIAC CATHETERIZATION N/A 03/26/2015   Procedure: Left Heart Cath and Coronary Angiography;  Surgeon: Leonie Man, MD;  Location: [place] [facility];  Service: Cardiovascular;  Laterality: N/A;  . CARDIAC VALVE REPLACEMENT    . CORONARY ARTERY BYPASS GRAFT N/A 03/30/2015  Procedure: CORONARY ARTERY BYPASS GRAFTING (CABG) x four, using left internal mammary artery and left    leg greater saphenous vein harvested endoscopically ;  Surgeon: Grace Isaac, MD;  Location: [place];  Service: Open Heart Surgery;  Laterality: N/A;  . CYSTOSCOPY/URETEROSCOPY/HOLMIUM LASER/STENT PLACEMENT Bilateral 11/07/2016   Procedure: CYSTOSCOPY/URETEROSCOPY/HOLMIUM LASER/STENT PLACEMENT;  Surgeon: Nickie Retort, MD;  [facility];  Service: Urology;  Laterality: Bilateral;  . CYSTOSCOPY/URETEROSCOPY/HOLMIUM LASER/STENT PLACEMENT Bilateral 11/23/2016   Procedure: CYSTOSCOPY/BILATERAL URETEROSCOPY/HOLMIUM LASER/STENT EXCHANGE;  Surgeon: Nickie Retort, MD;  [facility];  Service: Urology;  Laterality: Bilateral;  NEEDS 90 MIN   . EXTERNAL FIXATION LEG Left 02/22/2017    Procedure: EXTERNAL FIXATION LEFT ANKLE;  Surgeon: Renette Butters, MD;  Location: [place];  Service: Orthopedics;  Laterality: Left;  . HEMORRHOID BANDING  1970s  . INGUINAL HERNIA REPAIR Left 1962  . JOINT REPLACEMENT    . [place]   "cut me open"  . POSTERIOR FUSION CERVICAL SPINE  2006  . TEE WITHOUT CARDIOVERSION N/A 03/30/2015   Procedure: TRANSESOPHAGEAL ECHOCARDIOGRAM (TEE);  Surgeon: Grace Isaac, MD;  Location: [place];  Service: Open Heart Surgery;  Laterality: N/A;  . TESTICLE TORSION REDUCTION Right 1960  . TOE FUSION Left 1985 & 2004   "great toe"  . TONSILLECTOMY    . TOTAL HIP ARTHROPLASTY Right 10/27/2015   Procedure: RIGHT TOTAL HIP ARTHROPLASTY ANTERIOR APPROACH and steroid injection right foot;  Surgeon: Mcarthur Rossetti, MD;  Location: [place];  Service: Orthopedics;  Laterality: Right;    There were no vitals filed for this visit.  Subjective Assessment - 08/17/17 0805    Subjective  MD took me out of boot. Limited by pain.  Md said may have reached max flexibility.      Currently in Pain?  No/denies                      [facility] Adult PT Treatment/Exercise - 08/17/17 0001      Ambulation/Gait   Gait Comments  with SPC out on concrete slping walkway forwrd and back and sideway      Neuro Re-ed    Neuro Re-ed Details   worked on AP and lateral balance on rocker board and 2x4 and discussed options at home  to continue balance activity      Ankle Exercises: Aerobic   Stationary Bike  L4 6 min      Ankle Exercises: Standing   Other Standing Ankle Exercises  wall  slides x 15 then step ups forward and back x 10 8 inches  RT and LT , then                PT Short Term Goals - 07/25/17 1017      PT SHORT TERM GOAL #1   Title  He will be independent with initial HEP     Status  Achieved      PT SHORT TERM GOAL #2   Title  He will be able to walk WBAT with SPC in home    Status  Achieved      PT SHORT TERM GOAL  #3   Title  Swelling improved to 59 cm    Status  Achieved        PT Long Term Goals - 08/10/17 0839      PT LONG TERM GOAL #1   Title  He will be independent with all HEP issued  Status  On-going      PT LONG TERM GOAL #2   Title  He will be able to walk with least restricted device community distances    Baseline  still in boot    Status  Partially Met      PT LONG TERM GOAL #3   Title  He will return to riding motorcycle if allowed by surgeon    Status  On-going      PT LONG TERM GOAL #4   Title  He will report returning to all home tasks except more challenging activity like getting on roof.    Status  On-going      PT LONG TERM GOAL #5   Title  swelling will improve to no more than 1 cm greater girth than RT    Baseline  56 cm today    Status  On-going      PT LONG TERM GOAL #6   Title  pain will be intermittant and no more than 1-2/10    Status  Achieved      PT LONG TERM GOAL #7   Title  FOTO score improved to 20% limited or less    Status  Unable to assess            Plan - 08/17/17 0839    Clinical Impression Statement  Now in shoe and  limited only by pain. He was able to adapt with foot placement going up and down slope. Will contnue to progress off cxane if able  and balance.     PT Next Visit Plan  balance and abulation progress off SPC as able    PT Home Exercise Plan  edema management.  towel exercise, DF stretch, SLR 3 way , LAQ, knee flex seated green band    Consulted and Agree with Plan of Care  Patient       Patient will benefit from skilled therapeutic intervention in order to improve the following deficits and impairments:     Visit Diagnosis: Pain in left ankle and joints of left foot  Stiffness of left ankle, not elsewhere classified  Localized edema  Difficulty in walking, not elsewhere classified     Problem List Patient Active Problem List   Diagnosis Date Noted  . Pilon fracture 03/17/2017  . Pilon fracture of left  tibia 03/17/2017  . At risk for adverse drug reaction 02/28/2017  . Closed fracture dislocation of ankle joint, left, with delayed healing, subsequent encounter 02/22/2017  . Renal stone 10/26/2016  . Gross hematuria 10/26/2016  . Bladder neck obstruction 10/26/2016  . External hemorrhoid, thrombosed 06/10/2016  . Internal hemorrhoids 06/10/2016  . Lumbar back pain 05/19/2016  . Osteoarthritis of right hip 10/27/2015  . Status post total replacement of right hip 10/27/2015  . Chronic chest pain 09/22/2015  . Acute gouty arthritis 06/11/2015  . CAD (coronary artery disease), native coronary artery 06/11/2015  . Chronic diastolic CHF (congestive heart failure) ([place]) 06/11/2015  . H/O tissue AVR Oct 2016 06/10/2015  . PAF post CABG/AVR- Amiodarone 06/10/2015  . Dyslipidemia 06/10/2015  . S/P CABG x 4-Oct 2016 03/30/2015  . H/O NSTEMI-Oct 2016 03/27/2015  . 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 Depression 09/08/2014  . GERD (gastroesophageal reflux disease)   . Essential tremor 03/31/2014  . Iliotibial band syndrome affecting left lower leg 01/01/2014  . Spondylolisthesis at L4-L5 level 01/01/2014  . Sinus bradycardia 12/31/2013  . Greater trochanteric bursitis of right  hip 12/04/2013  . OSA on CPAP   . Diet-controlled diabetes mellitus ([place]) 07/08/2009  . COLONIC POLYPS, HX OF 12/31/2008  . GOUT 12/22/2008  . Essential hypertension 12/22/2008  . Diverticulosis of colon 12/22/2008  . NEPHROLITHIASIS, HX OF 12/22/2008    Darrel Hoover  PT 08/17/2017, 8:55 AM  [facility] [address] [place], [place], [address] Phone: [phone]   Fax:  [phone]  Name: Troy Banks MRN: [id number] Date of Birth: 05/09/1941

## 2017-08-21 ENCOUNTER — Other Ambulatory Visit: Payer: Self-pay | Admitting: Internal Medicine

## 2017-08-21 ENCOUNTER — Ambulatory Visit: Payer: Medicare Other

## 2017-08-21 DIAGNOSIS — M25572 Pain in left ankle and joints of left foot: Secondary | ICD-10-CM

## 2017-08-21 DIAGNOSIS — R262 Difficulty in walking, not elsewhere classified: Secondary | ICD-10-CM | POA: Diagnosis not present

## 2017-08-21 DIAGNOSIS — R600 Localized edema: Secondary | ICD-10-CM | POA: Diagnosis not present

## 2017-08-21 DIAGNOSIS — M25672 Stiffness of left ankle, not elsewhere classified: Secondary | ICD-10-CM | POA: Diagnosis not present

## 2017-08-21 NOTE — Therapy (Signed)
[place], [place], [address] Phone: [phone]   Fax:  [phone]  Physical Therapy Treatment  Patient Details  Name: Troy Banks MRN: [id number] Date of Birth: 07-Apr-1941 Referring Provider: Katha Hamming, MD   Encounter Date: 08/21/2017  PT End of Session - 08/21/17 0817    Visit Number  14    Number of Visits  24    Date for PT Re-Evaluation  09/08/17    Authorization Type  MEDICARE    Authorization Time Period  KX at visit 15    PT Start Time  0800    PT Stop Time  0845    PT Time Calculation (min)  45 min    Activity Tolerance  Patient tolerated treatment well    Behavior During Therapy  [facility] for tasks assessed/performed       Past Medical History:  Diagnosis Date  . Allergy   . Anxiety    PTSD  . Aortic stenosis, moderate 03/27/2015   post AVR echo Echo 11/16: Mild LVH, EF 55-60%, normal wall motion, grade 1 diastolic dysfunction, bioprosthetic AVR okay (mean gradient 18 mmHg) with trivial AI, mild LAE, atrial septal aneurysm, small effusion   . Arthritis    "qwhere" (10/29/2015)  . Carotid artery stenosis    Carotid US 11/17: 1-39% bilateral ICA stenosis. F/u prn  . Cervical stenosis of spinal canal    fusion 2006  . CHF (congestive heart failure) ([place])    "secondry to OHS"  . Closed left pilon fracture   . COLONIC POLYPS, HX OF   . Complication of anesthesia    "takes a lot to put him under and has woken up during surgery" Difficult to wake up . PTSD; "does not metabolize RX well"Pt. reports that he gets "violent", per pt.   . Diabetes mellitus without complication (HCC)    diet controlled  . DIVERTICULOSIS, COLON   . Dysrhythmia    2 bouts of afib post op and at home but corrected with medication.  . Familial tremor 03/19/2014  . GERD (gastroesophageal reflux disease)    uses Zantac  . GOUT   . Headache   . Heart murmur   . High cholesterol   . HYPERTENSION    off ACEI  2010 because of hyperkalemia in setting of ARI  . Hypothyroidism    newly diaganosed 09/2015  . Kidney stones    remote open stone-retrieval on L  . Myocardial infarction [facility])    October 20th 2016  . OSA on CPAP   . 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 PTSD (post-traumatic stress disorder)   . Sleep apnea    On a CPAP    Past Surgical History:  Procedure Laterality Date  . ANKLE FUSION Left 03/17/2017   Procedure: FUSION  PILON FRACTURE WITH BONE GRAFTING;  Surgeon: Shona Needles, MD;  Location: [place];  Service: Orthopedics;  Laterality: Left;  . ANTERIOR FUSION CERVICAL SPINE  2002  . AORTIC VALVE REPLACEMENT N/A 03/30/2015   Procedure: AORTIC VALVE REPLACEMENT (AVR);  Surgeon: Grace Isaac, MD;  Location: [place];  Service: Open Heart Surgery;  Laterality: N/A;  . BACK SURGERY    . CARDIAC CATHETERIZATION N/A 03/26/2015   Procedure: Left Heart Cath and Coronary Angiography;  Surgeon: Leonie Man, MD;  Location: [place] [facility];  Service: Cardiovascular;  Laterality: N/A;  . CARDIAC VALVE  REPLACEMENT    . CORONARY ARTERY BYPASS GRAFT N/A 03/30/2015   Procedure: CORONARY ARTERY BYPASS GRAFTING (CABG) x four, using left internal mammary artery and left    leg greater saphenous vein harvested endoscopically ;  Surgeon: Edward B Gerhardt, MD;  [facility];  Service: Open Heart Surgery;  Laterality: N/A;  . CYSTOSCOPY/URETEROSCOPY/HOLMIUM LASER/STENT PLACEMENT Bilateral 11/07/2016   Procedure: CYSTOSCOPY/URETEROSCOPY/HOLMIUM LASER/STENT PLACEMENT;  Surgeon: Budzyn, Brian James, MD;  [facility];  Service: Urology;  Laterality: Bilateral;  . CYSTOSCOPY/URETEROSCOPY/HOLMIUM LASER/STENT PLACEMENT Bilateral 11/23/2016   Procedure: CYSTOSCOPY/BILATERAL URETEROSCOPY/HOLMIUM LASER/STENT EXCHANGE;  Surgeon: Budzyn, Brian James, MD;  [facility];  Service: Urology;  Laterality: Bilateral;  NEEDS 90 MIN    . EXTERNAL FIXATION LEG Left 02/22/2017   Procedure: EXTERNAL FIXATION LEFT ANKLE;  Surgeon: Murphy, Timothy D, MD;  [facility];  Service: Orthopedics;  Laterality: Left;  . HEMORRHOID BANDING  1970s  . INGUINAL HERNIA REPAIR Left 1962  . JOINT REPLACEMENT    . KIDNEY STONE SURGERY  1986   "cut me open"  . POSTERIOR FUSION CERVICAL SPINE  2006  . TEE WITHOUT CARDIOVERSION N/A 03/30/2015   Procedure: TRANSESOPHAGEAL ECHOCARDIOGRAM (TEE);  Surgeon: Edward B Gerhardt, MD;  [facility];  Service: Open Heart Surgery;  Laterality: N/A;  . TESTICLE TORSION REDUCTION Right 1960  . TOE FUSION Left 1985 & 2004   "great toe"  . TONSILLECTOMY    . TOTAL HIP ARTHROPLASTY Right 10/27/2015   Procedure: RIGHT TOTAL HIP ARTHROPLASTY ANTERIOR APPROACH and steroid injection right foot;  Surgeon: Christopher Y Blackman, MD;  [facility];  Service: Orthopedics;  Laterality: Right;    There were no vitals filed for this visit.  Subjective Assessment - 08/21/17 0816    Subjective  Knee pain this weekend from gout but better now    Currently in Pain?  No/denies                      [facility] Adult PT Treatment/Exercise - 08/21/17 0001      Ambulation/Gait   Stairs  Yes    Stairs Assistance  6: Modified independent (Device/Increase time)    Stair Management Technique  One rail Right    Number of Stairs  12    Height of Stairs  6      Neuro Re-ed    Neuro Re-ed Details   tandem balance and walking heel toe in bars, side stepping RT /LT  then tandem stand with eyes open and closed  and then      Ankle Exercises: Aerobic   Stationary Bike  L4 6 min             PT Education - 08/21/17 0818    Education provided  Yes    Education Details  suggested on descent with stairs one step at a time to min risk of fall otherwise ok to alternate step up an ddown step as long as has good UE support. He had swelling last week. Suggested this was generally normal as long as it was  better in 2-3 days and may have been related to gout flare up but that  he shoulde call MD if swelling does not get better or has pain with this.     Person(s) Educated  Patient    Methods  Explanation    Comprehension  Verbalized understanding       PT Short Term Goals - 07/25/17 1017      PT   SHORT TERM GOAL #1   Title  He will be independent with initial HEP     Status  Achieved      PT SHORT TERM GOAL #2   Title  He will be able to walk WBAT with SPC in home    Status  Achieved      PT SHORT TERM GOAL #3   Title  Swelling improved to 59 cm    Status  Achieved        PT Long Term Goals - 08/10/17 0839      PT LONG TERM GOAL #1   Title  He will be independent with all HEP issued    Status  On-going      PT LONG TERM GOAL #2   Title  He will be able to walk with least restricted device community distances    Baseline  still in boot    Status  Partially Met      PT LONG TERM GOAL #3   Title  He will return to riding motorcycle if allowed by surgeon    Status  On-going      PT LONG TERM GOAL #4   Title  He will report returning to all home tasks except more challenging activity like getting on roof.    Status  On-going      PT LONG TERM GOAL #5   Title  swelling will improve to no more than 1 cm greater girth than RT    Baseline  56 cm today    Status  On-going      PT LONG TERM GOAL #6   Title  pain will be intermittant and no more than 1-2/10    Status  Achieved      PT LONG TERM GOAL #7   Title  FOTO score improved to 20% limited or less    Status  Unable to assess            Plan - 08/21/17 0817    Clinical Impression Statement  Walking part time no device at home and able to walk during session without device. Balance decr especially with eyes closed and this may alos relate to other medical conditions but he did well    PT Treatment/Interventions  Cryotherapy;Vasopneumatic Device;Therapeutic exercise;Therapeutic activities;Gait  training;Patient/family education;Passive range of motion;Manual techniques;Taping    PT Next Visit Plan  balance and ambulation progress off SPC as able    PT Home Exercise Plan  edema management.  towel exercise, DF stretch, SLR 3 way , LAQ, knee flex seated green band    Consulted and Agree with Plan of Care  Patient       Patient will benefit from skilled therapeutic intervention in order to improve the following deficits and impairments:  Pain, Decreased activity tolerance, Decreased balance, Decreased range of motion, Decreased strength, Increased edema, Difficulty walking  Visit Diagnosis: Pain in left ankle and joints of left foot  Stiffness of left ankle, not elsewhere classified  Localized edema  Difficulty in walking, not elsewhere classified     Problem List Patient Active Problem List   Diagnosis Date Noted  . Pilon fracture 03/17/2017  . Pilon fracture of left tibia 03/17/2017  . At risk for adverse drug reaction 02/28/2017  . Closed fracture dislocation of ankle joint, left, with delayed healing, subsequent encounter 02/22/2017  . Renal stone 10/26/2016  . Gross hematuria 10/26/2016  . Bladder neck obstruction 10/26/2016  . External hemorrhoid, thrombosed 06/10/2016  . Internal hemorrhoids   06/10/2016  . Lumbar back pain 05/19/2016  . Osteoarthritis of right hip 10/27/2015  . Status post total replacement of right hip 10/27/2015  . Chronic chest pain 09/22/2015  . Acute gouty arthritis 06/11/2015  . CAD (coronary artery disease), native coronary artery 06/11/2015  . Chronic diastolic CHF (congestive heart failure) ([place]) 06/11/2015  . H/O tissue AVR Oct 2016 06/10/2015  . PAF post CABG/AVR- Amiodarone 06/10/2015  . Dyslipidemia 06/10/2015  . S/P CABG x 4-Oct 2016 03/30/2015  . H/O NSTEMI-Oct 2016 03/27/2015  . 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 Depression 09/08/2014  . GERD (gastroesophageal reflux disease)   . Essential  tremor 03/31/2014  . Iliotibial band syndrome affecting left lower leg 01/01/2014  . Spondylolisthesis at L4-L5 level 01/01/2014  . Sinus bradycardia 12/31/2013  . Greater trochanteric bursitis of right hip 12/04/2013  . OSA on CPAP   . Diet-controlled diabetes mellitus ([place]) 07/08/2009  . COLONIC POLYPS, HX OF 12/31/2008  . GOUT 12/22/2008  . Essential hypertension 12/22/2008  . Diverticulosis of colon 12/22/2008  . NEPHROLITHIASIS, HX OF 12/22/2008    Darrel Hoover  PT 08/21/2017, 8:49 AM  [facility] [address] [place], [place], [address] Phone: [phone]   Fax:  [phone]  Name: Troy Banks MRN: [id number] Date of Birth: 1941/05/08

## 2017-08-23 ENCOUNTER — Ambulatory Visit: Payer: Medicare Other

## 2017-08-23 DIAGNOSIS — M25572 Pain in left ankle and joints of left foot: Secondary | ICD-10-CM

## 2017-08-23 DIAGNOSIS — M25672 Stiffness of left ankle, not elsewhere classified: Secondary | ICD-10-CM

## 2017-08-23 DIAGNOSIS — R600 Localized edema: Secondary | ICD-10-CM | POA: Diagnosis not present

## 2017-08-23 DIAGNOSIS — R262 Difficulty in walking, not elsewhere classified: Secondary | ICD-10-CM

## 2017-08-23 NOTE — Therapy (Signed)
[place], [place], [address] Phone: [phone]   Fax:  [phone]  Physical Therapy Treatment  Patient Details  Name: Troy Banks MRN: [id number] Date of Birth: Oct 14, 1940 Referring Provider: Katha Hamming, MD   Encounter Date: 08/23/2017  PT End of Session - 08/23/17 0756    Visit Number  15    Number of Visits  24    Date for PT Re-Evaluation  09/08/17    Authorization Type  MEDICARE    Authorization Time Period  KX at visit 15    PT Start Time  0755    PT Stop Time  0840    PT Time Calculation (min)  45 min    Activity Tolerance  Patient tolerated treatment well    Behavior During Therapy  [facility] for tasks assessed/performed       Past Medical History:  Diagnosis Date  . Allergy   . Anxiety    PTSD  . Aortic stenosis, moderate 03/27/2015   post AVR echo Echo 11/16: Mild LVH, EF 55-60%, normal wall motion, grade 1 diastolic dysfunction, bioprosthetic AVR okay (mean gradient 18 mmHg) with trivial AI, mild LAE, atrial septal aneurysm, small effusion   . Arthritis    "qwhere" (10/29/2015)  . Carotid artery stenosis    Carotid US 11/17: 1-39% bilateral ICA stenosis. F/u prn  . Cervical stenosis of spinal canal    fusion 2006  . CHF (congestive heart failure) ([place])    "secondry to OHS"  . Closed left pilon fracture   . COLONIC POLYPS, HX OF   . Complication of anesthesia    "takes a lot to put him under and has woken up during surgery" Difficult to wake up . PTSD; "does not metabolize RX well"Pt. reports that he gets "violent", per pt.   . Diabetes mellitus without complication (HCC)    diet controlled  . DIVERTICULOSIS, COLON   . Dysrhythmia    2 bouts of afib post op and at home but corrected with medication.  . Familial tremor 03/19/2014  . GERD (gastroesophageal reflux disease)    uses Zantac  . GOUT   . Headache   . Heart murmur   . High cholesterol   . HYPERTENSION    off ACEI  2010 because of hyperkalemia in setting of ARI  . Hypothyroidism    newly diaganosed 09/2015  . Kidney stones    remote open stone-retrieval on L  . Myocardial infarction [facility])    October 20th 2016  . OSA on CPAP   . 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 PTSD (post-traumatic stress disorder)   . Sleep apnea    On a CPAP    Past Surgical History:  Procedure Laterality Date  . ANKLE FUSION Left 03/17/2017   Procedure: FUSION  PILON FRACTURE WITH BONE GRAFTING;  Surgeon: Shona Needles, MD;  Location: [place];  Service: Orthopedics;  Laterality: Left;  . ANTERIOR FUSION CERVICAL SPINE  2002  . AORTIC VALVE REPLACEMENT N/A 03/30/2015   Procedure: AORTIC VALVE REPLACEMENT (AVR);  Surgeon: Grace Isaac, MD;  Location: [place];  Service: Open Heart Surgery;  Laterality: N/A;  . BACK SURGERY    . CARDIAC CATHETERIZATION N/A 03/26/2015   Procedure: Left Heart Cath and Coronary Angiography;  Surgeon: Leonie Man, MD;  Location: [place] [facility];  Service: Cardiovascular;  Laterality: N/A;  . CARDIAC VALVE  REPLACEMENT    . CORONARY ARTERY BYPASS GRAFT N/A 03/30/2015   Procedure: CORONARY ARTERY BYPASS GRAFTING (CABG) x four, using left internal mammary artery and left    leg greater saphenous vein harvested endoscopically ;  Surgeon: Grace Isaac, MD;  Location: [place];  Service: Open Heart Surgery;  Laterality: N/A;  . CYSTOSCOPY/URETEROSCOPY/HOLMIUM LASER/STENT PLACEMENT Bilateral 11/07/2016   Procedure: CYSTOSCOPY/URETEROSCOPY/HOLMIUM LASER/STENT PLACEMENT;  Surgeon: Nickie Retort, MD;  [facility];  Service: Urology;  Laterality: Bilateral;  . CYSTOSCOPY/URETEROSCOPY/HOLMIUM LASER/STENT PLACEMENT Bilateral 11/23/2016   Procedure: CYSTOSCOPY/BILATERAL URETEROSCOPY/HOLMIUM LASER/STENT EXCHANGE;  Surgeon: Nickie Retort, MD;  [facility];  Service: Urology;  Laterality: Bilateral;  NEEDS 90 MIN    . EXTERNAL FIXATION LEG Left 02/22/2017   Procedure: EXTERNAL FIXATION LEFT ANKLE;  Surgeon: Renette Butters, MD;  Location: [place];  Service: Orthopedics;  Laterality: Left;  . HEMORRHOID BANDING  1970s  . INGUINAL HERNIA REPAIR Left 1962  . JOINT REPLACEMENT    . [place]   "cut me open"  . POSTERIOR FUSION CERVICAL SPINE  2006  . TEE WITHOUT CARDIOVERSION N/A 03/30/2015   Procedure: TRANSESOPHAGEAL ECHOCARDIOGRAM (TEE);  Surgeon: Grace Isaac, MD;  Location: [place];  Service: Open Heart Surgery;  Laterality: N/A;  . TESTICLE TORSION REDUCTION Right 1960  . TOE FUSION Left 1985 & 2004   "great toe"  . TONSILLECTOMY    . TOTAL HIP ARTHROPLASTY Right 10/27/2015   Procedure: RIGHT TOTAL HIP ARTHROPLASTY ANTERIOR APPROACH and steroid injection right foot;  Surgeon: Mcarthur Rossetti, MD;  Location: [place];  Service: Orthopedics;  Laterality: Right;    There were no vitals filed for this visit.  Subjective Assessment - 08/23/17 0800    Subjective  foot pain lateral 1-2 Balance was not so good yesterday, stood for 3 hours later Monday     Currently in Pain?  Yes    Pain Score  2     Pain Location  Ankle    Pain Orientation  Left;Lateral    Pain Descriptors / Indicators  Aching    Pain Type  Chronic pain    Pain Frequency  Intermittent    Aggravating Factors   prolonged standing    Pain Relieving Factors  rest    Multiple Pain Sites  No                      [facility] Adult PT Treatment/Exercise - 08/23/17 0001      Neuro Re-ed    Neuro Re-ed Details   tandem balance and walking heel toe in bars, side stepping RT /LT  then tandem stand with eyes open and closed  and then 10 sec 6 reps  RT and LT foot forward  eyes closed /open RT foot back better 8-10 sec LT 3-5 sec , then stepping forward and off 3 inch foam x 12 RT and LT CGA by PT       Ankle Exercises: Aerobic   Stationary Bike  L4 6 min               PT Short Term Goals -  07/25/17 1017      PT SHORT TERM GOAL #1   Title  He will be independent with initial HEP     Status  Achieved      PT SHORT TERM GOAL #2   Title  He will be able to walk WBAT with SPC in home  Status  Achieved      PT SHORT TERM GOAL #3   Title  Swelling improved to 59 cm    Status  Achieved        PT Long Term Goals - 08/10/17 0839      PT LONG TERM GOAL #1   Title  He will be independent with all HEP issued    Status  On-going      PT LONG TERM GOAL #2   Title  He will be able to walk with least restricted device community distances    Baseline  still in boot    Status  Partially Met      PT LONG TERM GOAL #3   Title  He will return to riding motorcycle if allowed by surgeon    Status  On-going      PT LONG TERM GOAL #4   Title  He will report returning to all home tasks except more challenging activity like getting on roof.    Status  On-going      PT LONG TERM GOAL #5   Title  swelling will improve to no more than 1 cm greater girth than RT    Baseline  56 cm today    Status  On-going      PT LONG TERM GOAL #6   Title  pain will be intermittant and no more than 1-2/10    Status  Achieved      PT LONG TERM GOAL #7   Title  FOTO score improved to 20% limited or less    Status  Unable to assess            Plan - 08/23/17 0756    Clinical Impression Statement  Pain probably for being on feet for 3 hours Monday. Otherwise still doing well but needs to close supervision of CG for safety. Pain still no more than 2/10 end of session.     PT Treatment/Interventions  Cryotherapy;Vasopneumatic Device;Therapeutic exercise;Therapeutic activities;Gait training;Patient/family education;Passive range of motion;Manual techniques;Taping    PT Next Visit Plan  balance and ambulation progress off SPC as able    PT Home Exercise Plan  edema management.  towel exercise, DF stretch, SLR 3 way , LAQ, knee flex seated green band    Consulted and Agree with Plan of Care   Patient       Patient will benefit from skilled therapeutic intervention in order to improve the following deficits and impairments:  Pain, Decreased activity tolerance, Decreased balance, Decreased range of motion, Decreased strength, Increased edema, Difficulty walking  Visit Diagnosis: Pain in left ankle and joints of left foot  Stiffness of left ankle, not elsewhere classified  Localized edema  Difficulty in walking, not elsewhere classified     Problem List Patient Active Problem List   Diagnosis Date Noted  . Pilon fracture 03/17/2017  . Pilon fracture of left tibia 03/17/2017  . At risk for adverse drug reaction 02/28/2017  . Closed fracture dislocation of ankle joint, left, with delayed healing, subsequent encounter 02/22/2017  . Renal stone 10/26/2016  . Gross hematuria 10/26/2016  . Bladder neck obstruction 10/26/2016  . External hemorrhoid, thrombosed 06/10/2016  . Internal hemorrhoids 06/10/2016  . Lumbar back pain 05/19/2016  . Osteoarthritis of right hip 10/27/2015  . Status post total replacement of right hip 10/27/2015  . Chronic chest pain 09/22/2015  . Acute gouty arthritis 06/11/2015  . CAD (coronary artery disease), native coronary artery 06/11/2015  . Chronic diastolic CHF (  congestive heart failure) ([place]) 06/11/2015  . H/O tissue AVR Oct 2016 06/10/2015  . PAF post CABG/AVR- Amiodarone 06/10/2015  . Dyslipidemia 06/10/2015  . S/P CABG x 4-Oct 2016 03/30/2015  . H/O NSTEMI-Oct 2016 03/27/2015  . 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 Depression 09/08/2014  . GERD (gastroesophageal reflux disease)   . Essential tremor 03/31/2014  . Iliotibial band syndrome affecting left lower leg 01/01/2014  . Spondylolisthesis at L4-L5 level 01/01/2014  . Sinus bradycardia 12/31/2013  . Greater trochanteric bursitis of right hip 12/04/2013  . OSA on CPAP   . Diet-controlled diabetes mellitus ([place]) 07/08/2009  . COLONIC POLYPS, HX  OF 12/31/2008  . GOUT 12/22/2008  . Essential hypertension 12/22/2008  . Diverticulosis of colon 12/22/2008  . NEPHROLITHIASIS, HX OF 12/22/2008    Troy Banks  PT 08/23/2017, 8:42 AM  [facility] [address] [place], [place], [address] Phone: [phone]   Fax:  [phone]  Name: Troy Banks MRN: [id number] Date of Birth: 12-26-1940

## 2017-08-28 ENCOUNTER — Telehealth: Payer: Self-pay | Admitting: Cardiology

## 2017-08-28 ENCOUNTER — Encounter: Payer: Self-pay | Admitting: Physical Therapy

## 2017-08-28 ENCOUNTER — Ambulatory Visit: Payer: Medicare Other | Admitting: Physical Therapy

## 2017-08-28 DIAGNOSIS — R262 Difficulty in walking, not elsewhere classified: Secondary | ICD-10-CM

## 2017-08-28 DIAGNOSIS — M25572 Pain in left ankle and joints of left foot: Secondary | ICD-10-CM

## 2017-08-28 DIAGNOSIS — M25672 Stiffness of left ankle, not elsewhere classified: Secondary | ICD-10-CM

## 2017-08-28 DIAGNOSIS — R600 Localized edema: Secondary | ICD-10-CM

## 2017-08-28 MED ORDER — AMOXICILLIN 500 MG PO CAPS: 2000.0000 mg | ORAL_CAPSULE | Freq: Three times a day (TID) | ORAL | 1 refills | Status: DC | PRN

## 2017-08-28 NOTE — Telephone Encounter (Signed)
Spoke with Pharm D and reviewed medication and instructions   Ledora Bottcher, [place]     08/28/17 1:25 PM  Note    Patient has history of AVR and would need SBE prophylaxis with amoxicillin 2 grams taken 30-60 min prior to dental procedure. Prescription will be called into pharmacy.

## 2017-08-28 NOTE — Telephone Encounter (Signed)
Patient has history of AVR and would need SBE prophylaxis with amoxicillin 2 grams taken 30-60 min prior to dental procedure. Prescription will be called into pharmacy.

## 2017-08-28 NOTE — Therapy (Signed)
[place], [place], [address] Phone: [phone]   Fax:  [phone]  Physical Therapy Treatment  Patient Details  Name: Troy Banks MRN: [id number] Date of Birth: 11-15-1940 Referring Provider: Katha Hamming, MD   Encounter Date: 08/28/2017  PT End of Session - 08/28/17 1133    Visit Number  16    Number of Visits  24    Date for PT Re-Evaluation  09/08/17    PT Start Time  0730    PT Stop Time  0802    PT Time Calculation (min)  32 min    Activity Tolerance  Patient tolerated treatment well    Behavior During Therapy  [facility] for tasks assessed/performed       Past Medical History:  Diagnosis Date  . Allergy   . Anxiety    PTSD  . Aortic stenosis, moderate 03/27/2015   post AVR echo Echo 11/16: Mild LVH, EF 55-60%, normal wall motion, grade 1 diastolic dysfunction, bioprosthetic AVR okay (mean gradient 18 mmHg) with trivial AI, mild LAE, atrial septal aneurysm, small effusion   . Arthritis    "qwhere" (10/29/2015)  . Carotid artery stenosis    Carotid US 11/17: 1-39% bilateral ICA stenosis. F/u prn  . Cervical stenosis of spinal canal    fusion 2006  . CHF (congestive heart failure) ([place])    "secondry to OHS"  . Closed left pilon fracture   . COLONIC POLYPS, HX OF   . Complication of anesthesia    "takes a lot to put him under and has woken up during surgery" Difficult to wake up . PTSD; "does not metabolize RX well"Pt. reports that he gets "violent", per pt.   . Diabetes mellitus without complication (HCC)    diet controlled  . DIVERTICULOSIS, COLON   . Dysrhythmia    2 bouts of afib post op and at home but corrected with medication.  . Familial tremor 03/19/2014  . GERD (gastroesophageal reflux disease)    uses Zantac  . GOUT   . Headache   . Heart murmur   . High cholesterol   . HYPERTENSION    off ACEI 2010 because of hyperkalemia in setting of ARI  . Hypothyroidism    newly  diaganosed 09/2015  . Kidney stones    remote open stone-retrieval on L  . Myocardial infarction [facility])    October 20th 2016  . OSA on CPAP   . 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 PTSD (post-traumatic stress disorder)   . Sleep apnea    On a CPAP    Past Surgical History:  Procedure Laterality Date  . ANKLE FUSION Left 03/17/2017   Procedure: FUSION  PILON FRACTURE WITH BONE GRAFTING;  Surgeon: Shona Needles, MD;  Location: [place];  Service: Orthopedics;  Laterality: Left;  . ANTERIOR FUSION CERVICAL SPINE  2002  . AORTIC VALVE REPLACEMENT N/A 03/30/2015   Procedure: AORTIC VALVE REPLACEMENT (AVR);  Surgeon: Grace Isaac, MD;  Location: [place];  Service: Open Heart Surgery;  Laterality: N/A;  . BACK SURGERY    . CARDIAC CATHETERIZATION N/A 03/26/2015   Procedure: Left Heart Cath and Coronary Angiography;  Surgeon: Leonie Man, MD;  Location: [place] [facility];  Service: Cardiovascular;  Laterality: N/A;  . CARDIAC VALVE REPLACEMENT    . CORONARY ARTERY BYPASS GRAFT N/A 03/30/2015   Procedure: CORONARY ARTERY BYPASS GRAFTING (  CABG) x four, using left internal mammary artery and left    leg greater saphenous vein harvested endoscopically ;  Surgeon: Grace Isaac, MD;  Location: [place];  Service: Open Heart Surgery;  Laterality: N/A;  . CYSTOSCOPY/URETEROSCOPY/HOLMIUM LASER/STENT PLACEMENT Bilateral 11/07/2016   Procedure: CYSTOSCOPY/URETEROSCOPY/HOLMIUM LASER/STENT PLACEMENT;  Surgeon: Nickie Retort, MD;  [facility];  Service: Urology;  Laterality: Bilateral;  . CYSTOSCOPY/URETEROSCOPY/HOLMIUM LASER/STENT PLACEMENT Bilateral 11/23/2016   Procedure: CYSTOSCOPY/BILATERAL URETEROSCOPY/HOLMIUM LASER/STENT EXCHANGE;  Surgeon: Nickie Retort, MD;  [facility];  Service: Urology;  Laterality: Bilateral;  NEEDS 90 MIN   . EXTERNAL FIXATION LEG Left 02/22/2017   Procedure: EXTERNAL FIXATION LEFT  ANKLE;  Surgeon: Renette Butters, MD;  Location: [place];  Service: Orthopedics;  Laterality: Left;  . HEMORRHOID BANDING  1970s  . INGUINAL HERNIA REPAIR Left 1962  . JOINT REPLACEMENT    . [place]   "cut me open"  . POSTERIOR FUSION CERVICAL SPINE  2006  . TEE WITHOUT CARDIOVERSION N/A 03/30/2015   Procedure: TRANSESOPHAGEAL ECHOCARDIOGRAM (TEE);  Surgeon: Grace Isaac, MD;  Location: [place];  Service: Open Heart Surgery;  Laterality: N/A;  . TESTICLE TORSION REDUCTION Right 1960  . TOE FUSION Left 1985 & 2004   "great toe"  . TONSILLECTOMY    . TOTAL HIP ARTHROPLASTY Right 10/27/2015   Procedure: RIGHT TOTAL HIP ARTHROPLASTY ANTERIOR APPROACH and steroid injection right foot;  Surgeon: Mcarthur Rossetti, MD;  Location: [place];  Service: Orthopedics;  Laterality: Right;    There were no vitals filed for this visit.  Subjective Assessment - 08/28/17 0734    Subjective  Pain a t the most  2/10.  I was active trying to find  ancester's grave  .  Walking in [place] is a challange  uses a cane.  Walks barefoot in the house with no cane.      Currently in Pain?  Yes    Pain Score  2  at the most    Pain Orientation  Left;Medial    Pain Descriptors / Indicators  Aching    Pain Type  Chronic pain    Pain Frequency  Constant    Aggravating Factors   walking uneven pain stays the same    Pain Relieving Factors  rest    Effect of Pain on Daily Activities  cane                  No data recorded       [facility] Adult PT Treatment/Exercise - 08/28/17 0001      High Level Balance   High Level Balance Comments  on Blue pod with anti slip sheet under Multiple balance exercises peformed with CGA.  from weight shifting to reaching eyes open and closed,  static and dynamic.  wide and narrowed base of support.  Line walking ,, walking with high march and wall slides facing wall single leg balance and reach 10 X .  Wall slides were the most challanging.         Ankle Exercises: Aerobic   Stationary Bike  L4 6 min      Ankle Exercises: Standing   Other Standing Ankle Exercises  rsisted walking 10, 20 feet resisted walking with PTA giving resistance .  Patient demonstrated improved static balance post session.             PT Education - 08/28/17 1133    Education provided  No  PT Short Term Goals - 07/25/17 1017      PT SHORT TERM GOAL #1   Title  He will be independent with initial HEP     Status  Achieved      PT SHORT TERM GOAL #2   Title  He will be able to walk WBAT with SPC in home    Status  Achieved      PT SHORT TERM GOAL #3   Title  Swelling improved to 59 cm    Status  Achieved        PT Long Term Goals - 08/28/17 1134      PT LONG TERM GOAL #1   Title  He will be independent with all HEP issued    Baseline  He is independent with exercises issued so far    Time  12    Period  Weeks    Status  On-going      PT LONG TERM GOAL #2   Title  He will be able to walk with least restricted device community distances    Baseline  cane , no boot in community,  able to walk on uneven ground.    Time  12    Period  Weeks    Status  Partially Met      PT LONG TERM GOAL #3   Title  He will return to riding motorcycle if allowed by surgeon    Baseline  No longer has a Bike.      Time  12    Period  Weeks    Status  On-going      PT LONG TERM GOAL #4   Title  He will report returning to all home tasks except more challenging activity like getting on roof.    Baseline  He has not returned to gardening,  He does not plan to return to much yard work.      Time  12    Period  Weeks    Status  On-going      PT LONG TERM GOAL #5   Title  swelling will improve to no more than 1 cm greater girth than RT    Time  12    Period  Weeks    Status  Unable to assess      PT LONG TERM GOAL #6   Title  pain will be intermittant and no more than 1-2/10    Time  12    Period  Weeks    Status  Achieved      PT LONG  TERM GOAL #7   Title  FOTO score improved to 20% limited or less    Time  12    Period  Weeks    Status  Unable to assess            Plan - 08/28/17 1139    Clinical Impression Statement  Patient had 4/10 pain medial ankle with single leg weightbearing exercise.  He declined the need for modalities post session.  LTG#2 partially  met patient was able to wear shoes and walk through graveyards with a cane over the weekend.     PT Next Visit Plan  balance and ambulation progress off SPC as able.  Measure edema   (LTG# 5)       Patient will benefit from skilled therapeutic intervention in order to improve the following deficits and impairments:     Visit Diagnosis: Pain in left ankle and joints of  left foot  Stiffness of left ankle, not elsewhere classified  Localized edema  Difficulty in walking, not elsewhere classified     Problem List Patient Active Problem List   Diagnosis Date Noted  . Pilon fracture 03/17/2017  . Pilon fracture of left tibia 03/17/2017  . At risk for adverse drug reaction 02/28/2017  . Closed fracture dislocation of ankle joint, left, with delayed healing, subsequent encounter 02/22/2017  . Renal stone 10/26/2016  . Gross hematuria 10/26/2016  . Bladder neck obstruction 10/26/2016  . External hemorrhoid, thrombosed 06/10/2016  . Internal hemorrhoids 06/10/2016  . Lumbar back pain 05/19/2016  . Osteoarthritis of right hip 10/27/2015  . Status post total replacement of right hip 10/27/2015  . Chronic chest pain 09/22/2015  . Acute gouty arthritis 06/11/2015  . CAD (coronary artery disease), native coronary artery 06/11/2015  . Chronic diastolic CHF (congestive heart failure) ([place]) 06/11/2015  . H/O tissue AVR Oct 2016 06/10/2015  . PAF post CABG/AVR- Amiodarone 06/10/2015  . Dyslipidemia 06/10/2015  . S/P CABG x 4-Oct 2016 03/30/2015  . H/O NSTEMI-Oct 2016 03/27/2015  . 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  Depression 09/08/2014  . GERD (gastroesophageal reflux disease)   . Essential tremor 03/31/2014  . Iliotibial band syndrome affecting left lower leg 01/01/2014  . Spondylolisthesis at L4-L5 level 01/01/2014  . Sinus bradycardia 12/31/2013  . Greater trochanteric bursitis of right hip 12/04/2013  . OSA on CPAP   . Diet-controlled diabetes mellitus ([place]) 07/08/2009  . COLONIC POLYPS, HX OF 12/31/2008  . GOUT 12/22/2008  . Essential hypertension 12/22/2008  . Diverticulosis of colon 12/22/2008  . NEPHROLITHIASIS, HX OF 12/22/2008    Allia Wiltsey PTA 08/28/2017, 11:43 AM  [facility] [address] [place], [place], [address] Phone: [phone]   Fax:  [phone]  Name: Troy Banks MRN: [id number] Date of Birth: 04-Apr-1941

## 2017-08-28 NOTE — Telephone Encounter (Signed)
1. What dental office are you calling from? [facility]   What is your office phone and fax number? Office # [phone]  2. What type of procedure is the patient having performed? Filling replaced   What date is procedure scheduled or is the patient there now? Procedure was scheduled for this afternoon, [name] states that patient will call to cancel appt.  3. What is your question (ex. Antibiotics prior to procedure, holding medication-we need to know how long dentist wants pt to hold med)? Antibiotics prior to procedure

## 2017-08-28 NOTE — Telephone Encounter (Signed)
[facility] is [facility] to get Clarification on the directions for the  Amoxicillin. Please call

## 2017-08-29 ENCOUNTER — Ambulatory Visit (INDEPENDENT_AMBULATORY_CARE_PROVIDER_SITE_OTHER): Payer: Medicare Other | Admitting: Podiatry

## 2017-08-29 ENCOUNTER — Encounter: Payer: Self-pay | Admitting: Podiatry

## 2017-08-29 DIAGNOSIS — M79674 Pain in right toe(s): Secondary | ICD-10-CM | POA: Diagnosis not present

## 2017-08-29 DIAGNOSIS — E119 Type 2 diabetes mellitus without complications: Secondary | ICD-10-CM

## 2017-08-29 DIAGNOSIS — M79675 Pain in left toe(s): Secondary | ICD-10-CM

## 2017-08-29 DIAGNOSIS — B351 Tinea unguium: Secondary | ICD-10-CM | POA: Diagnosis not present

## 2017-08-29 NOTE — Progress Notes (Addendum)
Complaint:  Visit Type: Patient returns to my office for continued preventative foot care services. Complaint: Patient states" my nails have grown long and thick and become painful to walk and wear shoes" Patient has been diagnosed with DM with no foot complications.  He is diet controlled. The patient presents for preventative foot care services. No changes to ROS  Podiatric Exam: Vascular: dorsalis pedis and posterior tibial pulses are palpable right.  DP and PT are absent left foot due to swelling.. Capillary return is immediate. Temperature gradient is WNL. Skin turgor WNL  Sensorium: Normal Semmes Weinstein monofilament test. Normal tactile sensation bilaterally. Nail Exam: Pt has thick disfigured discolored nails with subungual debris noted bilateral entire nail hallux through fifth toenails Ulcer Exam: There is no evidence of ulcer or pre-ulcerative changes or infection. Orthopedic Exam: Muscle tone and strength are WNL. No limitations in general ROM. No crepitus or effusions noted. Foot type and digits show no abnormalities. Fusion 1st MPJ  Left. Skin: No Porokeratosis. No infection or ulcers  Diagnosis:  Onychomycosis, , Pain in right toe, pain in left toes  Treatment & Plan Procedures and Treatment: Consent by patient was obtained for treatment procedures.   Debridement of mycotic and hypertrophic toenails, 1 through 5 bilateral and clearing of subungual debris. No ulceration, no infection noted. ABN signed for 2019. Return Visit-Office Procedure: Patient instructed to return to the office for a follow up visit 10 weeks  for continued evaluation and treatment. Tumeric.    Gardiner Barefoot DPM

## 2017-08-30 ENCOUNTER — Ambulatory Visit: Payer: Medicare Other

## 2017-08-30 DIAGNOSIS — R600 Localized edema: Secondary | ICD-10-CM | POA: Diagnosis not present

## 2017-08-30 DIAGNOSIS — M25572 Pain in left ankle and joints of left foot: Secondary | ICD-10-CM | POA: Diagnosis not present

## 2017-08-30 DIAGNOSIS — M25672 Stiffness of left ankle, not elsewhere classified: Secondary | ICD-10-CM

## 2017-08-30 DIAGNOSIS — R262 Difficulty in walking, not elsewhere classified: Secondary | ICD-10-CM

## 2017-08-30 NOTE — Therapy (Signed)
[place], [place], [address] Phone: [phone]   Fax:  [phone]  Physical Therapy Treatment  Patient Details  Name: Troy Banks MRN: [id number] Date of Birth: 12-02-1940 Referring Provider: Katha Hamming, MD   Encounter Date: 08/30/2017  PT End of Session - 08/30/17 0708    Visit Number  17    Number of Visits  24    Date for PT Re-Evaluation  09/08/17    Authorization Type  MEDICARE    Authorization Time Period  KX at visit 15    PT Start Time  0655    PT Stop Time  0734    PT Time Calculation (min)  39 min    Activity Tolerance  Patient tolerated treatment well    Behavior During Therapy  [facility] for tasks assessed/performed       Past Medical History:  Diagnosis Date  . Allergy   . Anxiety    PTSD  . Aortic stenosis, moderate 03/27/2015   post AVR echo Echo 11/16: Mild LVH, EF 55-60%, normal wall motion, grade 1 diastolic dysfunction, bioprosthetic AVR okay (mean gradient 18 mmHg) with trivial AI, mild LAE, atrial septal aneurysm, small effusion   . Arthritis    "qwhere" (10/29/2015)  . Carotid artery stenosis    Carotid US 11/17: 1-39% bilateral ICA stenosis. F/u prn  . Cervical stenosis of spinal canal    fusion 2006  . CHF (congestive heart failure) ([place])    "secondry to OHS"  . Closed left pilon fracture   . COLONIC POLYPS, HX OF   . Complication of anesthesia    "takes a lot to put him under and has woken up during surgery" Difficult to wake up . PTSD; "does not metabolize RX well"Pt. reports that he gets "violent", per pt.   . Diabetes mellitus without complication (HCC)    diet controlled  . DIVERTICULOSIS, COLON   . Dysrhythmia    2 bouts of afib post op and at home but corrected with medication.  . Familial tremor 03/19/2014  . GERD (gastroesophageal reflux disease)    uses Zantac  . GOUT   . Headache   . Heart murmur   . High cholesterol   . HYPERTENSION    off ACEI  2010 because of hyperkalemia in setting of ARI  . Hypothyroidism    newly diaganosed 09/2015  . Kidney stones    remote open stone-retrieval on L  . Myocardial infarction [facility])    October 20th 2016  . OSA on CPAP   . 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 PTSD (post-traumatic stress disorder)   . Sleep apnea    On a CPAP    Past Surgical History:  Procedure Laterality Date  . ANKLE FUSION Left 03/17/2017   Procedure: FUSION  PILON FRACTURE WITH BONE GRAFTING;  Surgeon: Shona Needles, MD;  Location: [place];  Service: Orthopedics;  Laterality: Left;  . ANTERIOR FUSION CERVICAL SPINE  2002  . AORTIC VALVE REPLACEMENT N/A 03/30/2015   Procedure: AORTIC VALVE REPLACEMENT (AVR);  Surgeon: Grace Isaac, MD;  Location: [place];  Service: Open Heart Surgery;  Laterality: N/A;  . BACK SURGERY    . CARDIAC CATHETERIZATION N/A 03/26/2015   Procedure: Left Heart Cath and Coronary Angiography;  Surgeon: Leonie Man, MD;  Location: [place] CV [facility];  Service: Cardiovascular;  Laterality: N/A;  . CARDIAC VALVE  REPLACEMENT    . CORONARY ARTERY BYPASS GRAFT N/A 03/30/2015   Procedure: CORONARY ARTERY BYPASS GRAFTING (CABG) x four, using left internal mammary artery and left    leg greater saphenous vein harvested endoscopically ;  Surgeon: Grace Isaac, MD;  Location: [place];  Service: Open Heart Surgery;  Laterality: N/A;  . CYSTOSCOPY/URETEROSCOPY/HOLMIUM LASER/STENT PLACEMENT Bilateral 11/07/2016   Procedure: CYSTOSCOPY/URETEROSCOPY/HOLMIUM LASER/STENT PLACEMENT;  Surgeon: Nickie Retort, MD;  [facility];  Service: Urology;  Laterality: Bilateral;  . CYSTOSCOPY/URETEROSCOPY/HOLMIUM LASER/STENT PLACEMENT Bilateral 11/23/2016   Procedure: CYSTOSCOPY/BILATERAL URETEROSCOPY/HOLMIUM LASER/STENT EXCHANGE;  Surgeon: Nickie Retort, MD;  [facility];  Service: Urology;  Laterality: Bilateral;  NEEDS 90 MIN    . EXTERNAL FIXATION LEG Left 02/22/2017   Procedure: EXTERNAL FIXATION LEFT ANKLE;  Surgeon: Renette Butters, MD;  Location: [place];  Service: Orthopedics;  Laterality: Left;  . HEMORRHOID BANDING  1970s  . INGUINAL HERNIA REPAIR Left 1962  . JOINT REPLACEMENT    . [place]   "cut me open"  . POSTERIOR FUSION CERVICAL SPINE  2006  . TEE WITHOUT CARDIOVERSION N/A 03/30/2015   Procedure: TRANSESOPHAGEAL ECHOCARDIOGRAM (TEE);  Surgeon: Grace Isaac, MD;  Location: [place];  Service: Open Heart Surgery;  Laterality: N/A;  . TESTICLE TORSION REDUCTION Right 1960  . TOE FUSION Left 1985 & 2004   "great toe"  . TONSILLECTOMY    . TOTAL HIP ARTHROPLASTY Right 10/27/2015   Procedure: RIGHT TOTAL HIP ARTHROPLASTY ANTERIOR APPROACH and steroid injection right foot;  Surgeon: Mcarthur Rossetti, MD;  Location: [place];  Service: Orthopedics;  Laterality: Right;    There were no vitals filed for this visit.  Subjective Assessment - 08/30/17 0709    Subjective  Walking without cane all times indoors . Out doors 90% of time.     Pain Score  1     Pain Location  Ankle    Pain Orientation  Left;Medial    Pain Descriptors / Indicators  Aching    Pain Type  Chronic pain    Pain Onset  More than a month ago    Pain Frequency  Constant    Aggravating Factors   unlevel surfaces    Multiple Pain Sites  No                No data recorded       [facility] Adult PT Treatment/Exercise - 08/30/17 0001      High Level Balance   High Level Balance Comments  step stand with body blade RT and LT hand, stepping back into rotation RT and LT at 90 degree angles      Therapeutic Activites    Therapeutic Activities  Lifting    Lifting  from floor with box handles 01-16-17-23 pounds with good mechanics and carry 50 feet without LOB . 1 lift mild instability he corrected independnetly, then carry one hand with 15 pounds RT/LT 25 feet then 25 pounds x 25 feet RT/LT hand        Ankle Exercises: Aerobic   Stationary Bike  L4 6 min      Ankle Exercises: Standing   Other Standing Ankle Exercises  resisted walking with pully system  4 plates backward face pully  3 plates forward face away from pulley forward                PT Short Term Goals - 07/25/17 1017      PT SHORT  TERM GOAL #1   Title  He will be independent with initial HEP     Status  Achieved      PT SHORT TERM GOAL #2   Title  He will be able to walk WBAT with SPC in home    Status  Achieved      PT SHORT TERM GOAL #3   Title  Swelling improved to 59 cm    Status  Achieved        PT Long Term Goals - 08/28/17 1134      PT LONG TERM GOAL #1   Title  He will be independent with all HEP issued    Baseline  He is independent with exercises issued so far    Time  12    Period  Weeks    Status  On-going      PT LONG TERM GOAL #2   Title  He will be able to walk with least restricted device community distances    Baseline  cane , no boot in community,  able to walk on uneven ground.    Time  12    Period  Weeks    Status  Partially Met      PT LONG TERM GOAL #3   Title  He will return to riding motorcycle if allowed by surgeon    Baseline  No longer has a Bike.      Time  12    Period  Weeks    Status  On-going      PT LONG TERM GOAL #4   Title  He will report returning to all home tasks except more challenging activity like getting on roof.    Baseline  He has not returned to gardening,  He does not plan to return to much yard work.      Time  12    Period  Weeks    Status  On-going      PT LONG TERM GOAL #5   Title  swelling will improve to no more than 1 cm greater girth than RT    Time  12    Period  Weeks    Status  Unable to assess      PT LONG TERM GOAL #6   Title  pain will be intermittant and no more than 1-2/10    Time  12    Period  Weeks    Status  Achieved      PT LONG TERM GOAL #7   Title  FOTO score improved to 20% limited or less    Time  12     Period  Weeks    Status  Unable to assess            Plan - 08/30/17 0754    Clinical Impression Statement  Did well with lifting and carry. Good mechanics . Still mild risk of fall due to balance.  Safe carry one and two hand 25 pounds shorter distances.     PT Treatment/Interventions  Cryotherapy;Vasopneumatic Device;Therapeutic exercise;Therapeutic activities;Gait training;Patient/family education;Passive range of motion;Manual techniques;Taping    PT Next Visit Plan  balance and ambulation progress off SPC as able.  Measure edema   (LTG# 5)    PT Home Exercise Plan  edema management.  towel exercise, DF stretch, SLR 3 way , LAQ, knee flex seated green band    Consulted and Agree with Plan of Care  Patient       Patient will benefit  from skilled therapeutic intervention in order to improve the following deficits and impairments:  Pain, Decreased activity tolerance, Decreased balance, Decreased range of motion, Decreased strength, Increased edema, Difficulty walking  Visit Diagnosis: Pain in left ankle and joints of left foot  Stiffness of left ankle, not elsewhere classified  Localized edema  Difficulty in walking, not elsewhere classified     Problem List Patient Active Problem List   Diagnosis Date Noted  . Pilon fracture 03/17/2017  . Pilon fracture of left tibia 03/17/2017  . At risk for adverse drug reaction 02/28/2017  . Closed fracture dislocation of ankle joint, left, with delayed healing, subsequent encounter 02/22/2017  . Renal stone 10/26/2016  . Gross hematuria 10/26/2016  . Bladder neck obstruction 10/26/2016  . External hemorrhoid, thrombosed 06/10/2016  . Internal hemorrhoids 06/10/2016  . Lumbar back pain 05/19/2016  . Osteoarthritis of right hip 10/27/2015  . Status post total replacement of right hip 10/27/2015  . Chronic chest pain 09/22/2015  . Acute gouty arthritis 06/11/2015  . CAD (coronary artery disease), native coronary artery 06/11/2015   . Chronic diastolic CHF (congestive heart failure) ([place]) 06/11/2015  . H/O tissue AVR Oct 2016 06/10/2015  . PAF post CABG/AVR- Amiodarone 06/10/2015  . Dyslipidemia 06/10/2015  . S/P CABG x 4-Oct 2016 03/30/2015  . H/O NSTEMI-Oct 2016 03/27/2015  . 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 Depression 09/08/2014  . GERD (gastroesophageal reflux disease)   . Essential tremor 03/31/2014  . Iliotibial band syndrome affecting left lower leg 01/01/2014  . Spondylolisthesis at L4-L5 level 01/01/2014  . Sinus bradycardia 12/31/2013  . Greater trochanteric bursitis of right hip 12/04/2013  . OSA on CPAP   . Diet-controlled diabetes mellitus ([place]) 07/08/2009  . COLONIC POLYPS, HX OF 12/31/2008  . GOUT 12/22/2008  . Essential hypertension 12/22/2008  . Diverticulosis of colon 12/22/2008  . NEPHROLITHIASIS, HX OF 12/22/2008    Darrel Hoover  PT 08/30/2017, 7:55 AM  [facility] [address] [place], [place], [address] Phone: [phone]   Fax:  [phone]  Name: GIBRAN VESELKA MRN: [id number] Date of Birth: [birth year]

## 2017-09-04 ENCOUNTER — Ambulatory Visit: Payer: Medicare Other | Attending: Student

## 2017-09-04 DIAGNOSIS — R600 Localized edema: Secondary | ICD-10-CM | POA: Insufficient documentation

## 2017-09-04 DIAGNOSIS — R262 Difficulty in walking, not elsewhere classified: Secondary | ICD-10-CM | POA: Diagnosis not present

## 2017-09-04 DIAGNOSIS — M25672 Stiffness of left ankle, not elsewhere classified: Secondary | ICD-10-CM | POA: Insufficient documentation

## 2017-09-04 DIAGNOSIS — M25572 Pain in left ankle and joints of left foot: Secondary | ICD-10-CM | POA: Insufficient documentation

## 2017-09-04 NOTE — Therapy (Signed)
[place], [place], [address] Phone: [phone]   Fax:  [phone]  Physical Therapy Treatment  Patient Details  Name: Troy Banks MRN: [id number] Date of Birth: 12/03/1940 Referring Provider: Katha Hamming, MD   Encounter Date: 09/04/2017  PT End of Session - 09/04/17 0701    Visit Number  18    Number of Visits  24    Date for PT Re-Evaluation  09/08/17    Authorization Type  MEDICARE    Authorization Time Period  KX at visit 15    PT Start Time  0700    PT Stop Time  0745    PT Time Calculation (min)  45 min    Activity Tolerance  Patient tolerated treatment well    Behavior During Therapy  [facility] for tasks assessed/performed       Past Medical History:  Diagnosis Date  . Allergy   . Anxiety    PTSD  . Aortic stenosis, moderate 03/27/2015   post AVR echo Echo 11/16: Mild LVH, EF 55-60%, normal wall motion, grade 1 diastolic dysfunction, bioprosthetic AVR okay (mean gradient 18 mmHg) with trivial AI, mild LAE, atrial septal aneurysm, small effusion   . Arthritis    "qwhere" (10/29/2015)  . Carotid artery stenosis    Carotid US 11/17: 1-39% bilateral ICA stenosis. F/u prn  . Cervical stenosis of spinal canal    fusion 2006  . CHF (congestive heart failure) ([place])    "secondry to OHS"  . Closed left pilon fracture   . COLONIC POLYPS, HX OF   . Complication of anesthesia    "takes a lot to put him under and has woken up during surgery" Difficult to wake up . PTSD; "does not metabolize RX well"Pt. reports that he gets "violent", per pt.   . Diabetes mellitus without complication (HCC)    diet controlled  . DIVERTICULOSIS, COLON   . Dysrhythmia    2 bouts of afib post op and at home but corrected with medication.  . Familial tremor 03/19/2014  . GERD (gastroesophageal reflux disease)    uses Zantac  . GOUT   . Headache   . Heart murmur   . High cholesterol   . HYPERTENSION    off ACEI  2010 because of hyperkalemia in setting of ARI  . Hypothyroidism    newly diaganosed 09/2015  . Kidney stones    remote open stone-retrieval on L  . Myocardial infarction [facility])    October 20th 2016  . OSA on CPAP   . 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 PTSD (post-traumatic stress disorder)   . Sleep apnea    On a CPAP    Past Surgical History:  Procedure Laterality Date  . ANKLE FUSION Left 03/17/2017   Procedure: FUSION  PILON FRACTURE WITH BONE GRAFTING;  Surgeon: Shona Needles, MD;  Location: [place];  Service: Orthopedics;  Laterality: Left;  . ANTERIOR FUSION CERVICAL SPINE  2002  . AORTIC VALVE REPLACEMENT N/A 03/30/2015   Procedure: AORTIC VALVE REPLACEMENT (AVR);  Surgeon: Grace Isaac, MD;  Location: [place];  Service: Open Heart Surgery;  Laterality: N/A;  . BACK SURGERY    . CARDIAC CATHETERIZATION N/A 03/26/2015   Procedure: Left Heart Cath and Coronary Angiography;  Surgeon: Leonie Man, MD;  Location: [place] [facility];  Service: Cardiovascular;  Laterality: N/A;  . CARDIAC VALVE  REPLACEMENT    . CORONARY ARTERY BYPASS GRAFT N/A 03/30/2015   Procedure: CORONARY ARTERY BYPASS GRAFTING (CABG) x four, using left internal mammary artery and left    leg greater saphenous vein harvested endoscopically ;  Surgeon: Grace Isaac, MD;  Location: [place];  Service: Open Heart Surgery;  Laterality: N/A;  . CYSTOSCOPY/URETEROSCOPY/HOLMIUM LASER/STENT PLACEMENT Bilateral 11/07/2016   Procedure: CYSTOSCOPY/URETEROSCOPY/HOLMIUM LASER/STENT PLACEMENT;  Surgeon: Nickie Retort, MD;  [facility];  Service: Urology;  Laterality: Bilateral;  . CYSTOSCOPY/URETEROSCOPY/HOLMIUM LASER/STENT PLACEMENT Bilateral 11/23/2016   Procedure: CYSTOSCOPY/BILATERAL URETEROSCOPY/HOLMIUM LASER/STENT EXCHANGE;  Surgeon: Nickie Retort, MD;  [facility];  Service: Urology;  Laterality: Bilateral;  NEEDS 90 MIN    . EXTERNAL FIXATION LEG Left 02/22/2017   Procedure: EXTERNAL FIXATION LEFT ANKLE;  Surgeon: Renette Butters, MD;  Location: [place];  Service: Orthopedics;  Laterality: Left;  . HEMORRHOID BANDING  1970s  . INGUINAL HERNIA REPAIR Left 1962  . JOINT REPLACEMENT    . [place]   "cut me open"  . POSTERIOR FUSION CERVICAL SPINE  2006  . TEE WITHOUT CARDIOVERSION N/A 03/30/2015   Procedure: TRANSESOPHAGEAL ECHOCARDIOGRAM (TEE);  Surgeon: Grace Isaac, MD;  Location: [place];  Service: Open Heart Surgery;  Laterality: N/A;  . TESTICLE TORSION REDUCTION Right 1960  . TOE FUSION Left 1985 & 2004   "great toe"  . TONSILLECTOMY    . TOTAL HIP ARTHROPLASTY Right 10/27/2015   Procedure: RIGHT TOTAL HIP ARTHROPLASTY ANTERIOR APPROACH and steroid injection right foot;  Surgeon: Mcarthur Rossetti, MD;  Location: [place];  Service: Orthopedics;  Laterality: Right;    There were no vitals filed for this visit.      [facility] PT Assessment - 09/04/17 0001      Observation/Other Assessments   Focus on Therapeutic Outcomes (FOTO)   44% incr 7 pints but within predicted value      Ambulation/Gait   Ambulation Distance (Feet)  30 Feet    Assistive device  None    Gait Pattern  Step-through pattern    Ambulation Surface  Level;Indoor    Gait velocity  2.72.ft /sec      Standardized Balance Assessment   Standardized Balance Assessment  Berg Balance Test      Berg Balance Test   Sit to Stand  Able to stand without using hands and stabilize independently    Standing Unsupported  Able to stand safely 2 minutes    Sitting with Back Unsupported but Feet Supported on Floor or Stool  Able to sit safely and securely 2 minutes    Stand to Sit  Sits safely with minimal use of hands    Transfers  Able to transfer safely, minor use of hands    Standing Unsupported with Eyes Closed  Able to stand 10 seconds safely    Standing Ubsupported with Feet Together  Able to place feet together  independently and stand 1 minute safely    From Standing, Reach Forward with Outstretched Arm  Can reach forward >12 cm safely (5")    From Standing Position, Pick up Object from Floor  Able to pick up shoe safely and easily    From Standing Position, Turn to Look Behind Over each Shoulder  Looks behind from both sides and weight shifts well    Turn 360 Degrees  Able to turn 360 degrees safely in 4 seconds or less    Standing Unsupported, Alternately Place Feet on Step/Stool  Able to stand independently and safely and complete 8 steps in 20 seconds    Standing Unsupported, One Foot in Front  Able to plae foot ahead of the other independently and hold 30 seconds    Standing on One Leg  Tries to lift leg/unable to hold 3 seconds but remains standing independently    Total Score  51    Berg comment:  He is appropriatetly using cane in open spaces/uneven spaces and no cane level surfaces            No data recorded       [facility] Adult PT Treatment/Exercise - 09/04/17 0001      Therapeutic Activites    Lifting  practiced motion getting on to motorcycle stand on LT leg  balanc and lift of leg limited      Neuro Re-ed    Neuro Re-ed Details   BERG assessment and balance single leg practive      Ankle Exercises: Aerobic   Stationary Bike  L4 6 min      Ankle Exercises: Stretches   Other Stretch  hoip adductors stretch for getting on to cycle                PT Short Term Goals - 07/25/17 1017      PT SHORT TERM GOAL #1   Title  He will be independent with initial HEP     Status  Achieved      PT SHORT TERM GOAL #2   Title  He will be able to walk WBAT with SPC in home    Status  Achieved      PT SHORT TERM GOAL #3   Title  Swelling improved to 59 cm    Status  Achieved        PT Long Term Goals - 09/04/17 0800      PT LONG TERM GOAL #1   Title  He will be independent with all HEP issued    Status  On-going      PT LONG TERM GOAL #2   Title  He will be able  to walk with least restricted device community distances    Status  Achieved      PT LONG TERM GOAL #3   Title  He will return to riding motorcycle if allowed by surgeon    Status  On-going      PT LONG TERM GOAL #4   Title  He will report returning to all home tasks except more challenging activity like getting on roof.    Baseline  He has not returned to gardening,  He does not plan to return to much yard work.      Status  On-going      PT LONG TERM GOAL #5   Title  swelling will improve to no more than 1 cm greater girth than RT    Status  Unable to assess      PT LONG TERM GOAL #6   Title  pain will be intermittant and no more than 1-2/10    Status  Achieved      PT LONG TERM GOAL #7   Title  FOTO score improved to 20% limited or less    Baseline  decr 7 points from last survey    Status  On-going            Plan - 09/04/17 0749    Clinical Impression Statement  Troy Banks is doing well and  appropriatewith use of cane . Single leg balance key isssue for function . He struggles with getting leg of motorcycle so stretching for hip flexibility and  balance on LT leg dompnents to work on . Soreness of ant tib probablyu form more isometric sue due to ankle stiffness.   He can contact MD for questions.   Continue for 6 more vidits inPOC    PT Treatment/Interventions  Cryotherapy;Vasopneumatic Device;Therapeutic exercise;Therapeutic activities;Gait training;Patient/family education;Passive range of motion;Manual techniques;Taping    PT Next Visit Plan  balance and ambulation progress off SPC as able.  Measure edema   (LTG# 5), Work on hip ROm and single leg balance     PT Home Exercise Plan  edema management.  towel exercise, DF stretch, SLR 3 way , LAQ, knee flex seated green band    Consulted and Agree with Plan of Care  Patient       Patient will benefit from skilled therapeutic intervention in order to improve the following deficits and impairments:  Pain, Decreased  activity tolerance, Decreased balance, Decreased range of motion, Decreased strength, Increased edema, Difficulty walking  Visit Diagnosis: Pain in left ankle and joints of left foot  Stiffness of left ankle, not elsewhere classified  Localized edema  Difficulty in walking, not elsewhere classified     Problem List Patient Active Problem List   Diagnosis Date Noted  . Pilon fracture 03/17/2017  . Pilon fracture of left tibia 03/17/2017  . At risk for adverse drug reaction 02/28/2017  . Closed fracture dislocation of ankle joint, left, with delayed healing, subsequent encounter 02/22/2017  . Renal stone 10/26/2016  . Gross hematuria 10/26/2016  . Bladder neck obstruction 10/26/2016  . External hemorrhoid, thrombosed 06/10/2016  . Internal hemorrhoids 06/10/2016  . Lumbar back pain 05/19/2016  . Osteoarthritis of right hip 10/27/2015  . Status post total replacement of right hip 10/27/2015  . Chronic chest pain 09/22/2015  . Acute gouty arthritis 06/11/2015  . CAD (coronary artery disease), native coronary artery 06/11/2015  . Chronic diastolic CHF (congestive heart failure) ([place]) 06/11/2015  . H/O tissue AVR Oct 2016 06/10/2015  . PAF post CABG/AVR- Amiodarone 06/10/2015  . Dyslipidemia 06/10/2015  . S/P CABG x 4-Oct 2016 03/30/2015  . H/O NSTEMI-Oct 2016 03/27/2015  . 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 Depression 09/08/2014  . GERD (gastroesophageal reflux disease)   . Essential tremor 03/31/2014  . Iliotibial band syndrome affecting left lower leg 01/01/2014  . Spondylolisthesis at L4-L5 level 01/01/2014  . Sinus bradycardia 12/31/2013  . Greater trochanteric bursitis of right hip 12/04/2013  . OSA on CPAP   . Diet-controlled diabetes mellitus ([place]) 07/08/2009  . COLONIC POLYPS, HX OF 12/31/2008  . GOUT 12/22/2008  . Essential hypertension 12/22/2008  . Diverticulosis of colon 12/22/2008  . NEPHROLITHIASIS, HX OF 12/22/2008     Troy Banks  PT 09/04/2017, 8:05 AM  [facility] [address] [place], [place], [address] Phone: [phone]   Fax:  [phone]  Name: Troy Banks MRN: [id number] Date of Birth: 11-17-1940

## 2017-09-05 ENCOUNTER — Other Ambulatory Visit: Payer: Self-pay | Admitting: Neurology

## 2017-09-06 ENCOUNTER — Encounter: Payer: Self-pay | Admitting: Physical Therapy

## 2017-09-06 ENCOUNTER — Other Ambulatory Visit: Payer: Self-pay | Admitting: Internal Medicine

## 2017-09-06 ENCOUNTER — Ambulatory Visit: Payer: Medicare Other | Admitting: Physical Therapy

## 2017-09-06 DIAGNOSIS — M25672 Stiffness of left ankle, not elsewhere classified: Secondary | ICD-10-CM | POA: Diagnosis not present

## 2017-09-06 DIAGNOSIS — R600 Localized edema: Secondary | ICD-10-CM

## 2017-09-06 DIAGNOSIS — M25572 Pain in left ankle and joints of left foot: Secondary | ICD-10-CM | POA: Diagnosis not present

## 2017-09-06 DIAGNOSIS — R262 Difficulty in walking, not elsewhere classified: Secondary | ICD-10-CM | POA: Diagnosis not present

## 2017-09-06 NOTE — Therapy (Signed)
[place], [place], [address] Phone: [phone]   Fax:  [phone]  Physical Therapy Treatment  Patient Details  Name: Troy Banks MRN: [id number] Date of Birth: 08-31-1940 Referring Provider: Katha Hamming, MD   Encounter Date: 09/06/2017  PT End of Session - 09/06/17 1654    Visit Number  19    Number of Visits  24    Date for PT Re-Evaluation  09/08/17    PT Start Time  0800    PT Stop Time  0845    PT Time Calculation (min)  45 min    Activity Tolerance  Patient tolerated treatment well    Behavior During Therapy  [facility] for tasks assessed/performed       Past Medical History:  Diagnosis Date  . Allergy   . Anxiety    PTSD  . Aortic stenosis, moderate 03/27/2015   post AVR echo Echo 11/16: Mild LVH, EF 55-60%, normal wall motion, grade 1 diastolic dysfunction, bioprosthetic AVR okay (mean gradient 18 mmHg) with trivial AI, mild LAE, atrial septal aneurysm, small effusion   . Arthritis    "qwhere" (10/29/2015)  . Carotid artery stenosis    Carotid US 11/17: 1-39% bilateral ICA stenosis. F/u prn  . Cervical stenosis of spinal canal    fusion 2006  . CHF (congestive heart failure) ([place])    "secondry to OHS"  . Closed left pilon fracture   . COLONIC POLYPS, HX OF   . Complication of anesthesia    "takes a lot to put him under and has woken up during surgery" Difficult to wake up . PTSD; "does not metabolize RX well"Pt. reports that he gets "violent", per pt.   . Diabetes mellitus without complication (HCC)    diet controlled  . DIVERTICULOSIS, COLON   . Dysrhythmia    2 bouts of afib post op and at home but corrected with medication.  . Familial tremor 03/19/2014  . GERD (gastroesophageal reflux disease)    uses Zantac  . GOUT   . Headache   . Heart murmur   . High cholesterol   . HYPERTENSION    off ACEI 2010 because of hyperkalemia in setting of ARI  . Hypothyroidism    newly  diaganosed 09/2015  . Kidney stones    remote open stone-retrieval on L  . Myocardial infarction [facility])    October 20th 2016  . OSA on CPAP   . 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 PTSD (post-traumatic stress disorder)   . Sleep apnea    On a CPAP    Past Surgical History:  Procedure Laterality Date  . ANKLE FUSION Left 03/17/2017   Procedure: FUSION  PILON FRACTURE WITH BONE GRAFTING;  Surgeon: Shona Needles, MD;  Location: [place];  Service: Orthopedics;  Laterality: Left;  . ANTERIOR FUSION CERVICAL SPINE  2002  . AORTIC VALVE REPLACEMENT N/A 03/30/2015   Procedure: AORTIC VALVE REPLACEMENT (AVR);  Surgeon: Grace Isaac, MD;  Location: [place];  Service: Open Heart Surgery;  Laterality: N/A;  . BACK SURGERY    . CARDIAC CATHETERIZATION N/A 03/26/2015   Procedure: Left Heart Cath and Coronary Angiography;  Surgeon: Leonie Man, MD;  Location: [place] [facility];  Service: Cardiovascular;  Laterality: N/A;  . CARDIAC VALVE REPLACEMENT    . CORONARY ARTERY BYPASS GRAFT N/A 03/30/2015   Procedure: CORONARY ARTERY BYPASS GRAFTING (  CABG) x four, using left internal mammary artery and left    leg greater saphenous vein harvested endoscopically ;  Surgeon: Grace Isaac, MD;  Location: [place];  Service: Open Heart Surgery;  Laterality: N/A;  . CYSTOSCOPY/URETEROSCOPY/HOLMIUM LASER/STENT PLACEMENT Bilateral 11/07/2016   Procedure: CYSTOSCOPY/URETEROSCOPY/HOLMIUM LASER/STENT PLACEMENT;  Surgeon: Nickie Retort, MD;  [facility];  Service: Urology;  Laterality: Bilateral;  . CYSTOSCOPY/URETEROSCOPY/HOLMIUM LASER/STENT PLACEMENT Bilateral 11/23/2016   Procedure: CYSTOSCOPY/BILATERAL URETEROSCOPY/HOLMIUM LASER/STENT EXCHANGE;  Surgeon: Nickie Retort, MD;  [facility];  Service: Urology;  Laterality: Bilateral;  NEEDS 90 MIN   . EXTERNAL FIXATION LEG Left 02/22/2017   Procedure: EXTERNAL FIXATION LEFT  ANKLE;  Surgeon: Renette Butters, MD;  Location: [place];  Service: Orthopedics;  Laterality: Left;  . HEMORRHOID BANDING  1970s  . INGUINAL HERNIA REPAIR Left 1962  . JOINT REPLACEMENT    . [place]   "cut me open"  . POSTERIOR FUSION CERVICAL SPINE  2006  . TEE WITHOUT CARDIOVERSION N/A 03/30/2015   Procedure: TRANSESOPHAGEAL ECHOCARDIOGRAM (TEE);  Surgeon: Grace Isaac, MD;  Location: [place];  Service: Open Heart Surgery;  Laterality: N/A;  . TESTICLE TORSION REDUCTION Right 1960  . TOE FUSION Left 1985 & 2004   "great toe"  . TONSILLECTOMY    . TOTAL HIP ARTHROPLASTY Right 10/27/2015   Procedure: RIGHT TOTAL HIP ARTHROPLASTY ANTERIOR APPROACH and steroid injection right foot;  Surgeon: Mcarthur Rossetti, MD;  Location: [place];  Service: Orthopedics;  Laterality: Right;    There were no vitals filed for this visit.  Subjective Assessment - 09/06/17 0804    Subjective  Patient has been thinking about what PT said last visit.  I ofered to make today last visit but he wants to discuss with MD first.  Had a tooth pulled Monday.    Currently in Pain?  Yes    Pain Score  4     Pain Location  Ankle    Pain Orientation  Left;Medial    Pain Descriptors / Indicators  Aching    Pain Frequency  Intermittent    Aggravating Factors   he is not sure         [facility] PT Assessment - 09/06/17 0001      Figure 8 Edema   Figure 8 - Right   58    Figure 8 - Left   60                   [facility] Adult PT Treatment/Exercise - 09/06/17 0001      High Level Balance   High Level Balance Comments  in bars large steps, side steps backward,  on square foan narrow base static and dynamic balance work eyes open and closed.  balance challanges pressing from different directions from shoulders to feet 3 reps .   and other balance exercises at counter.  close SBA      Ankle Exercises: Aerobic   Stationary Bike  -- bike done prior to PT so did not count toward minutes.       Ankle Exercises: Standing   SLS  2-3 seconds    Other Standing Ankle Exercises  standing on floor , ball toss and catch to trampoline,  working on reaction and balance keeping both feet on floor.  Challanging.     Other Standing Ankle Exercises  SLS and 3 way SLR challanging making patient frustrated.  PT Education - 09/06/17 1653    Education provided  Yes    Education Details  How to work on balance.    Person(s) Educated  Patient    Methods  Explanation;Verbal cues;Tactile cues    Comprehension  Verbalized understanding;Returned demonstration       PT Short Term Goals - 07/25/17 1017      PT SHORT TERM GOAL #1   Title  He will be independent with initial HEP     Status  Achieved      PT SHORT TERM GOAL #2   Title  He will be able to walk WBAT with SPC in home    Status  Achieved      PT SHORT TERM GOAL #3   Title  Swelling improved to 59 cm    Status  Achieved        PT Long Term Goals - 09/06/17 0813      PT LONG TERM GOAL #1   Title  He will be independent with all HEP issued    Baseline  He is independent with exercises issued so far    Time  12    Period  Weeks    Status  On-going      PT LONG TERM GOAL #2   Title  He will be able to walk with least restricted device community distances    Time  12    Period  Weeks    Status  Achieved      PT LONG TERM GOAL #3   Baseline  No longer has a Bike.  Going shopping Friday.    Time  12    Period  Weeks    Status  On-going      PT LONG TERM GOAL #4   Title  He will report returning to all home tasks except more challenging activity like getting on roof.    Baseline  Back to everything he was doing prior to [facility].    Time  12    Period  Weeks    Status  Achieved      PT LONG TERM GOAL #5   Title  swelling will improve to no more than 1 cm greater girth than RT            Plan - 09/06/17 1655    Clinical Impression Statement  Balance focus today.  Single leg activities were  challanging with patient getting frustrated.  Tasks were changed to less difficult but challanging exercises.   Mild  pain increased during session.,  patient declined the need for modalities.  edema measured , see flow sheet.  LTG#5 not yet met.  Patient has more than 1 cm edema difference between feet.  LTG#4 met.    PT Next Visit Plan  balance and ambulation progress off SPC as able., Work on hip ROm and single leg balance     PT Home Exercise Plan  edema management.  towel exercise, DF stretch, SLR 3 way , LAQ, knee flex seated green band    Consulted and Agree with Plan of Care  Patient       Patient will benefit from skilled therapeutic intervention in order to improve the following deficits and impairments:     Visit Diagnosis: Pain in left ankle and joints of left foot  Stiffness of left ankle, not elsewhere classified  Localized edema  Difficulty in walking, not elsewhere classified     Problem List Patient Active Problem List  Diagnosis Date Noted  . Pilon fracture 03/17/2017  . Pilon fracture of left tibia 03/17/2017  . At risk for adverse drug reaction 02/28/2017  . Closed fracture dislocation of ankle joint, left, with delayed healing, subsequent encounter 02/22/2017  . Renal stone 10/26/2016  . Gross hematuria 10/26/2016  . Bladder neck obstruction 10/26/2016  . External hemorrhoid, thrombosed 06/10/2016  . Internal hemorrhoids 06/10/2016  . Lumbar back pain 05/19/2016  . Osteoarthritis of right hip 10/27/2015  . Status post total replacement of right hip 10/27/2015  . Chronic chest pain 09/22/2015  . Acute gouty arthritis 06/11/2015  . CAD (coronary artery disease), native coronary artery 06/11/2015  . Chronic diastolic CHF (congestive heart failure) ([place]) 06/11/2015  . H/O tissue AVR Oct 2016 06/10/2015  . PAF post CABG/AVR- Amiodarone 06/10/2015  . Dyslipidemia 06/10/2015  . S/P CABG x 4-Oct 2016 03/30/2015  . H/O NSTEMI-Oct 2016 03/27/2015  . Poor drug  metabolizer due to cytochrome p450 CYP2D6 variant ([place] [employer])   . Depression 09/08/2014  . GERD (gastroesophageal reflux disease)   . Essential tremor 03/31/2014  . Iliotibial band syndrome affecting left lower leg 01/01/2014  . Spondylolisthesis at L4-L5 level 01/01/2014  . Sinus bradycardia 12/31/2013  . Greater trochanteric bursitis of right hip 12/04/2013  . OSA on CPAP   . Diet-controlled diabetes mellitus ([place]) 07/08/2009  . COLONIC POLYPS, HX OF 12/31/2008  . GOUT 12/22/2008  . Essential hypertension 12/22/2008  . Diverticulosis of colon 12/22/2008  . NEPHROLITHIASIS, HX OF 12/22/2008    HARRIS,KAREN PTA 09/06/2017, 5:02 PM  [facility] [address] [place], [place], [address] Phone: [phone]   Fax:  [phone]  Name: Troy Banks MRN: [id number] Date of Birth: 1940-10-09

## 2017-09-11 ENCOUNTER — Ambulatory Visit: Payer: Medicare Other

## 2017-09-11 DIAGNOSIS — R262 Difficulty in walking, not elsewhere classified: Secondary | ICD-10-CM | POA: Diagnosis not present

## 2017-09-11 DIAGNOSIS — M25572 Pain in left ankle and joints of left foot: Secondary | ICD-10-CM

## 2017-09-11 DIAGNOSIS — R600 Localized edema: Secondary | ICD-10-CM | POA: Diagnosis not present

## 2017-09-11 DIAGNOSIS — M25672 Stiffness of left ankle, not elsewhere classified: Secondary | ICD-10-CM | POA: Diagnosis not present

## 2017-09-11 NOTE — Therapy (Addendum)
[place], [place], [address] Phone: [phone]   Fax:  [phone]  Physical Therapy Treatment Progress Note Reporting Period 07/25/17 to 09/11/17  See note below for Objective Data and Assessment of Progress/Goals.      Patient Details  Name: Troy Banks MRN: [id number] Date of Birth: 02/26/1941 Referring Provider: Katha Hamming, MD   Encounter Date: 09/11/2017  PT End of Session - 09/11/17 0730    Visit Number  20    Number of Visits  24    Date for PT Re-Evaluation  09/08/17    Authorization Type  MEDICARE    Authorization Time Period  KX at visit 15    PT Start Time  0700    PT Stop Time  0745    PT Time Calculation (min)  45 min    Activity Tolerance  Patient tolerated treatment well    Behavior During Therapy  [facility] for tasks assessed/performed       Past Medical History:  Diagnosis Date  . Allergy   . Anxiety    PTSD  . Aortic stenosis, moderate 03/27/2015   post AVR echo Echo 11/16: Mild LVH, EF 55-60%, normal wall motion, grade 1 diastolic dysfunction, bioprosthetic AVR okay (mean gradient 18 mmHg) with trivial AI, mild LAE, atrial septal aneurysm, small effusion   . Arthritis    "qwhere" (10/29/2015)  . Carotid artery stenosis    Carotid US 11/17: 1-39% bilateral ICA stenosis. F/u prn  . Cervical stenosis of spinal canal    fusion 2006  . CHF (congestive heart failure) ([place])    "secondry to OHS"  . Closed left pilon fracture   . COLONIC POLYPS, HX OF   . Complication of anesthesia    "takes a lot to put him under and has woken up during surgery" Difficult to wake up . PTSD; "does not metabolize RX well"Pt. reports that he gets "violent", per pt.   . Diabetes mellitus without complication (HCC)    diet controlled  . DIVERTICULOSIS, COLON   . Dysrhythmia    2 bouts of afib post op and at home but corrected with medication.  . Familial tremor 03/19/2014  . GERD  (gastroesophageal reflux disease)    uses Zantac  . GOUT   . Headache   . Heart murmur   . High cholesterol   . HYPERTENSION    off ACEI 2010 because of hyperkalemia in setting of ARI  . Hypothyroidism    newly diaganosed 09/2015  . Kidney stones    remote open stone-retrieval on L  . Myocardial infarction [facility])    October 20th 2016  . OSA on CPAP   . 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 PTSD (post-traumatic stress disorder)   . Sleep apnea    On a CPAP    Past Surgical History:  Procedure Laterality Date  . ANKLE FUSION Left 03/17/2017   Procedure: FUSION  PILON FRACTURE WITH BONE GRAFTING;  Surgeon: Shona Needles, MD;  Location: [place];  Service: Orthopedics;  Laterality: Left;  . ANTERIOR FUSION CERVICAL SPINE  2002  . AORTIC VALVE REPLACEMENT N/A 03/30/2015   Procedure: AORTIC VALVE REPLACEMENT (AVR);  Surgeon: Grace Isaac, MD;  Location: [place];  Service: Open Heart Surgery;  Laterality: N/A;  . BACK SURGERY    . CARDIAC CATHETERIZATION N/A 03/26/2015   Procedure: Left Heart Cath and Coronary Angiography;  Surgeon: Leonie Man, MD;  Location: [place] [facility];  Service: Cardiovascular;  Laterality: N/A;  . CARDIAC VALVE REPLACEMENT    . CORONARY ARTERY BYPASS GRAFT N/A 03/30/2015   Procedure: CORONARY ARTERY BYPASS GRAFTING (CABG) x four, using left internal mammary artery and left    leg greater saphenous vein harvested endoscopically ;  Surgeon: Grace Isaac, MD;  Location: [place];  Service: Open Heart Surgery;  Laterality: N/A;  . CYSTOSCOPY/URETEROSCOPY/HOLMIUM LASER/STENT PLACEMENT Bilateral 11/07/2016   Procedure: CYSTOSCOPY/URETEROSCOPY/HOLMIUM LASER/STENT PLACEMENT;  Surgeon: Nickie Retort, MD;  [facility];  Service: Urology;  Laterality: Bilateral;  . CYSTOSCOPY/URETEROSCOPY/HOLMIUM LASER/STENT PLACEMENT Bilateral 11/23/2016   Procedure: CYSTOSCOPY/BILATERAL  URETEROSCOPY/HOLMIUM LASER/STENT EXCHANGE;  Surgeon: Nickie Retort, MD;  [facility];  Service: Urology;  Laterality: Bilateral;  NEEDS 90 MIN   . EXTERNAL FIXATION LEG Left 02/22/2017   Procedure: EXTERNAL FIXATION LEFT ANKLE;  Surgeon: Renette Butters, MD;  Location: [place];  Service: Orthopedics;  Laterality: Left;  . HEMORRHOID BANDING  1970s  . INGUINAL HERNIA REPAIR Left 1962  . JOINT REPLACEMENT    . [place]   "cut me open"  . POSTERIOR FUSION CERVICAL SPINE  2006  . TEE WITHOUT CARDIOVERSION N/A 03/30/2015   Procedure: TRANSESOPHAGEAL ECHOCARDIOGRAM (TEE);  Surgeon: Grace Isaac, MD;  Location: [place];  Service: Open Heart Surgery;  Laterality: N/A;  . TESTICLE TORSION REDUCTION Right 1960  . TOE FUSION Left 1985 & 2004   "great toe"  . TONSILLECTOMY    . TOTAL HIP ARTHROPLASTY Right 10/27/2015   Procedure: RIGHT TOTAL HIP ARTHROPLASTY ANTERIOR APPROACH and steroid injection right foot;  Surgeon: Mcarthur Rossetti, MD;  Location: [place];  Service: Orthopedics;  Laterality: Right;    There were no vitals filed for this visit.  Subjective Assessment - 09/11/17 0709    Subjective  He reports frustration and may not be able to ride motorcycle as he can't control bike, lift foot over without signif cifficulty and piull and push bike seated on bike.     Currently in Pain?  Yes    Pain Score  3     Pain Location  Ankle    Pain Orientation  Left;Medial    Pain Descriptors / Indicators  Aching    Pain Type  Chronic pain    Pain Onset  More than a month ago    Aggravating Factors   not sure                       [facility] Adult PT Treatment/Exercise - 09/11/17 0001      Neuro Re-ed    Neuro Re-ed Details   tandem walking and SLS activity       Ankle Exercises: Standing   Other Standing Ankle Exercises  3 way SLR RT and LT , side step lunges RT and Lt  , pulley pull 5 plates and piush 3 plates with arms at 80 degree swith dowel to  mimic moving motorcycle with CGA of PT.                PT Short Term Goals - 07/25/17 1017      PT SHORT TERM GOAL #1   Title  He will be independent with initial HEP     Status  Achieved      PT SHORT TERM GOAL #2   Title  He will be able to walk WBAT with [facility]  in home    Status  Achieved      PT SHORT TERM GOAL #3   Title  Swelling improved to 59 cm    Status  Achieved        PT Long Term Goals - 09/06/17 0813      PT LONG TERM GOAL #1   Title  He will be independent with all HEP issued    Baseline  He is independent with exercises issued so far    Time  12    Period  Weeks    Status  On-going      PT LONG TERM GOAL #2   Title  He will be able to walk with least restricted device community distances    Time  12    Period  Weeks    Status  Achieved      PT LONG TERM GOAL #3   Baseline  No longer has a Bike.  Going shopping Friday.    Time  12    Period  Weeks    Status  On-going      PT LONG TERM GOAL #4   Title  He will report returning to all home tasks except more challenging activity like getting on roof.    Baseline  Back to everything he was doing prior to [facility].    Time  12    Period  Weeks    Status  Achieved      PT LONG TERM GOAL #5   Title  swelling will improve to no more than 1 cm greater girth than RT            Plan - 09/11/17 0728    Clinical Impression Statement  He is concerned he will not ride bike again. Suggested he may be able t in  future as he is  just atarting phase of these activities and may get better in future. discussions during exer with pausing while Mr Purdy responded.      PT Treatment/Interventions  Cryotherapy;Vasopneumatic Device;Therapeutic exercise;Therapeutic activities;Gait training;Patient/family education;Passive range of motion;Manual techniques;Taping    PT Next Visit Plan  balance and ambulation progress off SPC as able., Work on hip ROm and single leg balance     PT Home Exercise Plan  edema  management.  towel exercise, DF stretch, SLR 3 way , LAQ, knee flex seated green band    Consulted and Agree with Plan of Care  Patient       Patient will benefit from skilled therapeutic intervention in order to improve the following deficits and impairments:  Pain, Decreased activity tolerance, Decreased balance, Decreased range of motion, Decreased strength, Increased edema, Difficulty walking  Visit Diagnosis: Pain in left ankle and joints of left foot  Stiffness of left ankle, not elsewhere classified  Localized edema  Difficulty in walking, not elsewhere classified     Problem List Patient Active Problem List   Diagnosis Date Noted  . Pilon fracture 03/17/2017  . Pilon fracture of left tibia 03/17/2017  . At risk for adverse drug reaction 02/28/2017  . Closed fracture dislocation of ankle joint, left, with delayed healing, subsequent encounter 02/22/2017  . Renal stone 10/26/2016  . Gross hematuria 10/26/2016  . Bladder neck obstruction 10/26/2016  . External hemorrhoid, thrombosed 06/10/2016  . Internal hemorrhoids 06/10/2016  . Lumbar back pain 05/19/2016  . Osteoarthritis of right hip 10/27/2015  . Status post total replacement of right hip 10/27/2015  . Chronic chest pain 09/22/2015  . Acute  gouty arthritis 06/11/2015  . CAD (coronary artery disease), native coronary artery 06/11/2015  . Chronic diastolic CHF (congestive heart failure) ([place]) 06/11/2015  . H/O tissue AVR Oct 2016 06/10/2015  . PAF post CABG/AVR- Amiodarone 06/10/2015  . Dyslipidemia 06/10/2015  . S/P CABG x 4-Oct 2016 03/30/2015  . H/O NSTEMI-Oct 2016 03/27/2015  . 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 Depression 09/08/2014  . GERD (gastroesophageal reflux disease)   . Essential tremor 03/31/2014  . Iliotibial band syndrome affecting left lower leg 01/01/2014  . Spondylolisthesis at L4-L5 level 01/01/2014  . Sinus bradycardia 12/31/2013  . Greater trochanteric  bursitis of right hip 12/04/2013  . OSA on CPAP   . Diet-controlled diabetes mellitus ([place]) 07/08/2009  . COLONIC POLYPS, HX OF 12/31/2008  . GOUT 12/22/2008  . Essential hypertension 12/22/2008  . Diverticulosis of colon 12/22/2008  . NEPHROLITHIASIS, HX OF 12/22/2008    Darrel Hoover  PT 09/11/2017, 7:55 AM  [facility] [address] [place], [place], [address] Phone: [phone]   Fax:  [phone]  Name: Troy Banks MRN: [id number] Date of Birth: April 17, 1941

## 2017-09-12 ENCOUNTER — Encounter: Payer: Self-pay | Admitting: Gastroenterology

## 2017-09-13 ENCOUNTER — Encounter: Payer: Self-pay | Admitting: Physical Therapy

## 2017-09-13 ENCOUNTER — Ambulatory Visit: Payer: Medicare Other | Admitting: Physical Therapy

## 2017-09-13 DIAGNOSIS — R600 Localized edema: Secondary | ICD-10-CM | POA: Diagnosis not present

## 2017-09-13 DIAGNOSIS — M25572 Pain in left ankle and joints of left foot: Secondary | ICD-10-CM

## 2017-09-13 DIAGNOSIS — M25672 Stiffness of left ankle, not elsewhere classified: Secondary | ICD-10-CM | POA: Diagnosis not present

## 2017-09-13 DIAGNOSIS — R262 Difficulty in walking, not elsewhere classified: Secondary | ICD-10-CM | POA: Diagnosis not present

## 2017-09-13 NOTE — Therapy (Signed)
[place], [place], [address] Phone: [phone]   Fax:  [phone]  Physical Therapy Treatment  Patient Details  Name: Troy Banks MRN: [id number] Date of Birth: 05/26/1941 Referring Provider: Katha Hamming, MD   Encounter Date: 09/13/2017  PT End of Session - 09/13/17 1148    Visit Number  21    Number of Visits  24    PT Start Time  0758    PT Stop Time  0845    PT Time Calculation (min)  47 min    Activity Tolerance  Patient tolerated treatment well    Behavior During Therapy  [facility] for tasks assessed/performed       Past Medical History:  Diagnosis Date  . Allergy   . Anxiety    PTSD  . Aortic stenosis, moderate 03/27/2015   post AVR echo Echo 11/16: Mild LVH, EF 55-60%, normal wall motion, grade 1 diastolic dysfunction, bioprosthetic AVR okay (mean gradient 18 mmHg) with trivial AI, mild LAE, atrial septal aneurysm, small effusion   . Arthritis    "qwhere" (10/29/2015)  . Carotid artery stenosis    Carotid US 11/17: 1-39% bilateral ICA stenosis. F/u prn  . Cervical stenosis of spinal canal    fusion 2006  . CHF (congestive heart failure) ([place])    "secondry to OHS"  . Closed left pilon fracture   . COLONIC POLYPS, HX OF   . Complication of anesthesia    "takes a lot to put him under and has woken up during surgery" Difficult to wake up . PTSD; "does not metabolize RX well"Pt. reports that he gets "violent", per pt.   . Diabetes mellitus without complication (HCC)    diet controlled  . DIVERTICULOSIS, COLON   . Dysrhythmia    2 bouts of afib post op and at home but corrected with medication.  . Familial tremor 03/19/2014  . GERD (gastroesophageal reflux disease)    uses Zantac  . GOUT   . Headache   . Heart murmur   . High cholesterol   . HYPERTENSION    off ACEI 2010 because of hyperkalemia in setting of ARI  . Hypothyroidism    newly diaganosed 09/2015  . Kidney stones    remote open stone-retrieval on L  . Myocardial infarction [facility])    October 20th 2016  . OSA on CPAP   . 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 PTSD (post-traumatic stress disorder)   . Sleep apnea    On a CPAP    Past Surgical History:  Procedure Laterality Date  . ANKLE FUSION Left 03/17/2017   Procedure: FUSION  PILON FRACTURE WITH BONE GRAFTING;  Surgeon: Shona Needles, MD;  Location: [place];  Service: Orthopedics;  Laterality: Left;  . ANTERIOR FUSION CERVICAL SPINE  2002  . AORTIC VALVE REPLACEMENT N/A 03/30/2015   Procedure: AORTIC VALVE REPLACEMENT (AVR);  Surgeon: Grace Isaac, MD;  Location: [place];  Service: Open Heart Surgery;  Laterality: N/A;  . BACK SURGERY    . CARDIAC CATHETERIZATION N/A 03/26/2015   Procedure: Left Heart Cath and Coronary Angiography;  Surgeon: Leonie Man, MD;  Location: [place] [facility];  Service: Cardiovascular;  Laterality: N/A;  . CARDIAC VALVE REPLACEMENT    . CORONARY ARTERY BYPASS GRAFT N/A 03/30/2015   Procedure: CORONARY ARTERY BYPASS GRAFTING (CABG) x four, using left internal mammary artery and left  leg greater saphenous vein harvested endoscopically ;  Surgeon: Grace Isaac, MD;  Location: [place];  Service: Open Heart Surgery;  Laterality: N/A;  . CYSTOSCOPY/URETEROSCOPY/HOLMIUM LASER/STENT PLACEMENT Bilateral 11/07/2016   Procedure: CYSTOSCOPY/URETEROSCOPY/HOLMIUM LASER/STENT PLACEMENT;  Surgeon: Nickie Retort, MD;  [facility];  Service: Urology;  Laterality: Bilateral;  . CYSTOSCOPY/URETEROSCOPY/HOLMIUM LASER/STENT PLACEMENT Bilateral 11/23/2016   Procedure: CYSTOSCOPY/BILATERAL URETEROSCOPY/HOLMIUM LASER/STENT EXCHANGE;  Surgeon: Nickie Retort, MD;  [facility];  Service: Urology;  Laterality: Bilateral;  NEEDS 90 MIN   . EXTERNAL FIXATION LEG Left 02/22/2017   Procedure: EXTERNAL FIXATION LEFT ANKLE;  Surgeon: Renette Butters, MD;   Location: [place];  Service: Orthopedics;  Laterality: Left;  . HEMORRHOID BANDING  1970s  . INGUINAL HERNIA REPAIR Left 1962  . JOINT REPLACEMENT    . [place]   "cut me open"  . POSTERIOR FUSION CERVICAL SPINE  2006  . TEE WITHOUT CARDIOVERSION N/A 03/30/2015   Procedure: TRANSESOPHAGEAL ECHOCARDIOGRAM (TEE);  Surgeon: Grace Isaac, MD;  Location: [place];  Service: Open Heart Surgery;  Laterality: N/A;  . TESTICLE TORSION REDUCTION Right 1960  . TOE FUSION Left 1985 & 2004   "great toe"  . TONSILLECTOMY    . TOTAL HIP ARTHROPLASTY Right 10/27/2015   Procedure: RIGHT TOTAL HIP ARTHROPLASTY ANTERIOR APPROACH and steroid injection right foot;  Surgeon: Mcarthur Rossetti, MD;  Location: [place];  Service: Orthopedics;  Laterality: Right;    There were no vitals filed for this visit.  Subjective Assessment - 09/13/17 0805    Subjective  4/10  from doing too much  I over worked it up. .  it did not keep me up.      Currently in Pain?  Yes    Pain Score  6     Pain Location  Ankle    Pain Descriptors / Indicators  Aching    Pain Type  Chronic pain    Aggravating Factors   squatting,  putting a lot of weight on left foot    Pain Relieving Factors  rest    Effect of Pain on Daily Activities  cane    Multiple Pain Sites  -- dental pain                       [facility] Adult PT Treatment/Exercise - 09/13/17 0001      High Level Balance   High Level Balance Activities  Side stepping;Backward walking;Head turns;Tandem walking;Marching forwards;Marching backwards;Negotiating over obstacles;Other (comment)    High Level Balance Comments  forward reverse,  carry clipboard forward back, large steps holding 3 seconds 3 laps,  tracing linr on floor 3 X each , both I and -  bar needed for ----position      Knee/Hip Exercises: Supine   Quad Sets Limitations  10 X 5 seconds    Bridges  10 reps    Straight Leg Raises  1 set;10 reps;Both cues    Other Supine  Knee/Hip Exercises  core strength clam singles,  slides and march ,  cues and monitoring      Knee/Hip Exercises: Sidelying   Hip ABduction  1 set;10 reps    Clams  10 X each side after cues      Ankle Exercises: Aerobic   Stationary Bike  L1 8 minutes .[name]        Ankle Exercises: Standing   Other Standing Ankle Exercises  squat instruction able to do correctly  X 5.  He had been doing these at home even tho not added to his HEP.  He had been moving knees past toes and irritating the fusion.  he was able to do correctly after instruction.               PT Education - 09/13/17 0902    Education provided  Yes    Education Details  Exercise form    Person(s) Educated  Patient    Methods  Explanation;Demonstration;Verbal cues    Comprehension  Verbalized understanding;Returned demonstration       PT Short Term Goals - 07/25/17 1017      PT SHORT TERM GOAL #1   Title  He will be independent with initial HEP     Status  Achieved      PT SHORT TERM GOAL #2   Title  He will be able to walk WBAT with SPC in home    Status  Achieved      PT SHORT TERM GOAL #3   Title  Swelling improved to 59 cm    Status  Achieved        PT Long Term Goals - 09/13/17 1153      PT LONG TERM GOAL #1   Title  He will be independent with all HEP issued    Baseline  He is independent with exercises issued so far    Time  12    Period  Weeks    Status  On-going      PT LONG TERM GOAL #2   Title  He will be able to walk with least restricted device community distances    Time  12    Period  Weeks    Status  Achieved      PT LONG TERM GOAL #3   Title  He will return to riding motorcycle if allowed by surgeon    Baseline  has not for multiple reasons    Time  12    Status  On-going      PT LONG TERM GOAL #4   Title  He will report returning to all home tasks except more challenging activity like getting on roof.    Baseline  Back to everything he was doing prior to Fusion.    Time  12     Period  Weeks    Status  Achieved      PT LONG TERM GOAL #5   Title  swelling will improve to no more than 1 cm greater girth than RT    Time  12    Period  Weeks    Status  Unable to assess      PT LONG TERM GOAL #6   Title  pain will be intermittant and no more than 1-2/10    Baseline  4/10    Time  12    Period  Weeks    Status  Achieved      PT LONG TERM GOAL #7   Title  FOTO score improved to 20% limited or less    Time  12    Period  Weeks    Status  Unable to assess            Plan - 09/13/17 1149    Clinical Impression Statement  Patient feels he is shaking more with mental stress more than physical.  Motorcycle is how he has de-stressed in the past.   Core strength to help balance on mat  today due to increased shakeyness with balance activities.  Pain 4/10 at end of session.  He declined the need for modalities.     PT Next Visit Plan  balance and ambulation progress off SPC as able., Work on hip ROm and single leg balance .  measure edema    PT Home Exercise Plan  edema management.  towel exercise, DF stretch, SLR 3 way , LAQ, knee flex seated green band    Consulted and Agree with Plan of Care  Patient       Patient will benefit from skilled therapeutic intervention in order to improve the following deficits and impairments:     Visit Diagnosis: Pain in left ankle and joints of left foot  Stiffness of left ankle, not elsewhere classified  Localized edema  Difficulty in walking, not elsewhere classified     Problem List Patient Active Problem List   Diagnosis Date Noted  . Pilon fracture 03/17/2017  . Pilon fracture of left tibia 03/17/2017  . At risk for adverse drug reaction 02/28/2017  . Closed fracture dislocation of ankle joint, left, with delayed healing, subsequent encounter 02/22/2017  . Renal stone 10/26/2016  . Gross hematuria 10/26/2016  . Bladder neck obstruction 10/26/2016  . External hemorrhoid, thrombosed 06/10/2016  . Internal  hemorrhoids 06/10/2016  . Lumbar back pain 05/19/2016  . Osteoarthritis of right hip 10/27/2015  . Status post total replacement of right hip 10/27/2015  . Chronic chest pain 09/22/2015  . Acute gouty arthritis 06/11/2015  . CAD (coronary artery disease), native coronary artery 06/11/2015  . Chronic diastolic CHF (congestive heart failure) ([place]) 06/11/2015  . H/O tissue AVR Oct 2016 06/10/2015  . PAF post CABG/AVR- Amiodarone 06/10/2015  . Dyslipidemia 06/10/2015  . S/P CABG x 4-Oct 2016 03/30/2015  . H/O NSTEMI-Oct 2016 03/27/2015  . 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 Depression 09/08/2014  . GERD (gastroesophageal reflux disease)   . Essential tremor 03/31/2014  . Iliotibial band syndrome affecting left lower leg 01/01/2014  . Spondylolisthesis at L4-L5 level 01/01/2014  . Sinus bradycardia 12/31/2013  . Greater trochanteric bursitis of right hip 12/04/2013  . OSA on CPAP   . Diet-controlled diabetes mellitus ([place]) 07/08/2009  . COLONIC POLYPS, HX OF 12/31/2008  . GOUT 12/22/2008  . Essential hypertension 12/22/2008  . Diverticulosis of colon 12/22/2008  . NEPHROLITHIASIS, HX OF 12/22/2008    Boykin Baetz PTA 09/13/2017, 11:57 AM  [facility] [address] [place], [place], [address] Phone: [phone]   Fax:  [phone]  Name: Troy Banks MRN: [id number] Date of Birth: Mar 14, 1941

## 2017-09-18 ENCOUNTER — Ambulatory Visit: Payer: Medicare Other

## 2017-09-18 DIAGNOSIS — M25672 Stiffness of left ankle, not elsewhere classified: Secondary | ICD-10-CM | POA: Diagnosis not present

## 2017-09-18 DIAGNOSIS — R600 Localized edema: Secondary | ICD-10-CM | POA: Diagnosis not present

## 2017-09-18 DIAGNOSIS — M25572 Pain in left ankle and joints of left foot: Secondary | ICD-10-CM | POA: Diagnosis not present

## 2017-09-18 DIAGNOSIS — R262 Difficulty in walking, not elsewhere classified: Secondary | ICD-10-CM | POA: Diagnosis not present

## 2017-09-18 NOTE — Therapy (Signed)
[place], [place], [address] Phone: [phone]   Fax:  [phone]  Physical Therapy Treatment  Patient Details  Name: Troy Banks MRN: [id number] Date of Birth: 05/20/1941 Referring Provider: Katha Hamming, MD   Encounter Date: 09/18/2017  PT End of Session - 09/18/17 0759    Visit Number  22    Number of Visits  24    Date for PT Re-Evaluation  10/13/17    Authorization Type  MEDICARE    Authorization Time Period  KX at visit 15    PT Start Time  0700    PT Stop Time  0750    PT Time Calculation (min)  50 min    Activity Tolerance  Patient tolerated treatment well;No increased pain    Behavior During Therapy  [facility] for tasks assessed/performed       Past Medical History:  Diagnosis Date  . Allergy   . Anxiety    PTSD  . Aortic stenosis, moderate 03/27/2015   post AVR echo Echo 11/16: Mild LVH, EF 55-60%, normal wall motion, grade 1 diastolic dysfunction, bioprosthetic AVR okay (mean gradient 18 mmHg) with trivial AI, mild LAE, atrial septal aneurysm, small effusion   . Arthritis    "qwhere" (10/29/2015)  . Carotid artery stenosis    Carotid US 11/17: 1-39% bilateral ICA stenosis. F/u prn  . Cervical stenosis of spinal canal    fusion 2006  . CHF (congestive heart failure) ([place])    "secondry to OHS"  . Closed left pilon fracture   . COLONIC POLYPS, HX OF   . Complication of anesthesia    "takes a lot to put him under and has woken up during surgery" Difficult to wake up . PTSD; "does not metabolize RX well"Pt. reports that he gets "violent", per pt.   . Diabetes mellitus without complication (HCC)    diet controlled  . DIVERTICULOSIS, COLON   . Dysrhythmia    2 bouts of afib post op and at home but corrected with medication.  . Familial tremor 03/19/2014  . GERD (gastroesophageal reflux disease)    uses Zantac  . GOUT   . Headache   . Heart murmur   . High cholesterol   .  HYPERTENSION    off ACEI 2010 because of hyperkalemia in setting of ARI  . Hypothyroidism    newly diaganosed 09/2015  . Kidney stones    remote open stone-retrieval on L  . Myocardial infarction [facility]    October 20th 2016  . OSA on CPAP   . 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 PTSD (post-traumatic stress disorder)   . Sleep apnea    On a CPAP    Past Surgical History:  Procedure Laterality Date  . ANKLE FUSION Left 03/17/2017   Procedure: FUSION  PILON FRACTURE WITH BONE GRAFTING;  Surgeon: Shona Needles, MD;  Location: [place];  Service: Orthopedics;  Laterality: Left;  . ANTERIOR FUSION CERVICAL SPINE  2002  . AORTIC VALVE REPLACEMENT N/A 03/30/2015   Procedure: AORTIC VALVE REPLACEMENT (AVR);  Surgeon: Grace Isaac, MD;  Location: [place];  Service: Open Heart Surgery;  Laterality: N/A;  . BACK SURGERY    . CARDIAC CATHETERIZATION N/A 03/26/2015   Procedure: Left Heart Cath and Coronary Angiography;  Surgeon: Leonie Man, MD;  Location: [place] [facility];  Service: Cardiovascular;  Laterality: N/A;  .  CARDIAC VALVE REPLACEMENT    . CORONARY ARTERY BYPASS GRAFT N/A 03/30/2015   Procedure: CORONARY ARTERY BYPASS GRAFTING (CABG) x four, using left internal mammary artery and left    leg greater saphenous vein harvested endoscopically ;  Surgeon: Grace Isaac, MD;  Location: [place];  Service: Open Heart Surgery;  Laterality: N/A;  . CYSTOSCOPY/URETEROSCOPY/HOLMIUM LASER/STENT PLACEMENT Bilateral 11/07/2016   Procedure: CYSTOSCOPY/URETEROSCOPY/HOLMIUM LASER/STENT PLACEMENT;  Surgeon: Nickie Retort, MD;  [facility];  Service: Urology;  Laterality: Bilateral;  . CYSTOSCOPY/URETEROSCOPY/HOLMIUM LASER/STENT PLACEMENT Bilateral 11/23/2016   Procedure: CYSTOSCOPY/BILATERAL URETEROSCOPY/HOLMIUM LASER/STENT EXCHANGE;  Surgeon: Nickie Retort, MD;  [facility];  Service: Urology;  Laterality:  Bilateral;  NEEDS 90 MIN   . EXTERNAL FIXATION LEG Left 02/22/2017   Procedure: EXTERNAL FIXATION LEFT ANKLE;  Surgeon: Renette Butters, MD;  Location: [place];  Service: Orthopedics;  Laterality: Left;  . HEMORRHOID BANDING  1970s  . INGUINAL HERNIA REPAIR Left 1962  . JOINT REPLACEMENT    . [place]   "cut me open"  . POSTERIOR FUSION CERVICAL SPINE  2006  . TEE WITHOUT CARDIOVERSION N/A 03/30/2015   Procedure: TRANSESOPHAGEAL ECHOCARDIOGRAM (TEE);  Surgeon: Grace Isaac, MD;  Location: [place];  Service: Open Heart Surgery;  Laterality: N/A;  . TESTICLE TORSION REDUCTION Right 1960  . TOE FUSION Left 1985 & 2004   "great toe"  . TONSILLECTOMY    . TOTAL HIP ARTHROPLASTY Right 10/27/2015   Procedure: RIGHT TOTAL HIP ARTHROPLASTY ANTERIOR APPROACH and steroid injection right foot;  Surgeon: Mcarthur Rossetti, MD;  Location: [place];  Service: Orthopedics;  Laterality: Right;    There were no vitals filed for this visit.  Subjective Assessment - 09/18/17 0713    Subjective  2/10. I worked ankle yesterday with home cleaning.      Pain Score  2     Pain Location  Ankle    Pain Orientation  Left;Medial    Pain Descriptors / Indicators  Aching    Pain Type  Chronic pain    Pain Onset  More than a month ago    Pain Frequency  Intermittent    Aggravating Factors   acricvity on feet , squatting    Pain Relieving Factors  rest    Multiple Pain Sites  No                       [facility] Adult PT Treatment/Exercise - 09/18/17 0001      High Level Balance   High Level Balance Activities  Side stepping;Backward walking;Head turns;Tandem walking;Marching forwards;Marching backwards;Negotiating over obstacles;Other (comment)      Therapeutic Activites    Therapeutic Activities  Other Therapeutic Activities    Other Therapeutic Activities  Worked on various senarios and seat heights to practice getting leg over to sit on motorcycle.   We were able to use  peds equipment to mimic use of handles . Followed this with stretching on step for hip flexion ROM and also hip adductor  stretch for hip flexibility to lift leg over       Ankle Exercises: Aerobic   Stationary Bike  L1 6 minutes .[name]               PT Education - 09/18/17 0757    Education provided  Yes    Education Details  hip flexion and abduction stretching in standing with use of stairs for balance 2x/day min 30 sec  x 2-3 reps     Person(s) Educated  Patient    Methods  Explanation;Demonstration;Verbal cues declined handout    Comprehension  Returned demonstration;Verbalized understanding       PT Short Term Goals - 07/25/17 1017      PT SHORT TERM GOAL #1   Title  He will be independent with initial HEP     Status  Achieved      PT SHORT TERM GOAL #2   Title  He will be able to walk WBAT with SPC in home    Status  Achieved      PT SHORT TERM GOAL #3   Title  Swelling improved to 59 cm    Status  Achieved        PT Long Term Goals - 09/18/17 0802      PT LONG TERM GOAL #1   Title  He will be independent with all HEP issued    Baseline  He is independent with exercises issued so far    Status  On-going      PT LONG TERM GOAL #2   Title  He will be able to walk with least restricted device community distances    Baseline  cane , no boot in community,  able to walk on uneven ground. using cane less out of home    Status  Achieved      PT LONG TERM GOAL #3   Title  He will return to riding motorcycle if allowed by surgeon    Baseline  has not for multiple reasons    Status  On-going      PT LONG TERM GOAL #4   Title  He will report returning to all home tasks except more challenging activity like getting on roof.    Baseline  Back to everything he was doing prior to Fusion.    Status  Achieved      PT LONG TERM GOAL #5   Title  swelling will improve to no more than 1 cm greater girth than RT    Status  Unable to assess      PT LONG TERM GOAL #6    Title  pain will be intermittant and no more than 1-2/10    Baseline  4/10 last week    Status  Partially Met      PT LONG TERM GOAL #7   Title  FOTO score improved to 20% limited or less    Baseline  decr 7 points from last survey    Status  Unable to assess            Plan - 09/18/17 0759    Clinical Impression Statement  He did not come to clinic with device. [place] working on [employer] and getting on and off bike activity as he wants to ride his motorcycle again. He is improving with balance lifting leg but may need more flexibility to facilitate getting on and off bike.   I think he can do bette with this so will extend PT for 6-8 more visits    PT Frequency  2x / week    PT Duration  4 weeks    PT Treatment/Interventions  Cryotherapy;Vasopneumatic Device;Therapeutic exercise;Therapeutic activities;Gait training;Patient/family education;Passive range of motion;Manual techniques;Taping    PT Next Visit Plan  balance and ambulation progress off SPC as able., Work on hip ROM and single leg balance .  measure edema    PT Home Exercise Plan  edema management.  towel  exercise, DF stretch, SLR 3 way , LAQ, knee flex seated green band, hip flexion and abduction stretch in standing.     Consulted and Agree with Plan of Care  Patient       Patient will benefit from skilled therapeutic intervention in order to improve the following deficits and impairments:  Pain, Decreased activity tolerance, Decreased balance, Decreased range of motion, Decreased strength, Increased edema, Difficulty walking  Visit Diagnosis: Pain in left ankle and joints of left foot  Stiffness of left ankle, not elsewhere classified  Localized edema  Difficulty in walking, not elsewhere classified     Problem List Patient Active Problem List   Diagnosis Date Noted  . Pilon fracture 03/17/2017  . Pilon fracture of left tibia 03/17/2017  . At risk for adverse drug reaction 02/28/2017  . Closed fracture  dislocation of ankle joint, left, with delayed healing, subsequent encounter 02/22/2017  . Renal stone 10/26/2016  . Gross hematuria 10/26/2016  . Bladder neck obstruction 10/26/2016  . External hemorrhoid, thrombosed 06/10/2016  . Internal hemorrhoids 06/10/2016  . Lumbar back pain 05/19/2016  . Osteoarthritis of right hip 10/27/2015  . Status post total replacement of right hip 10/27/2015  . Chronic chest pain 09/22/2015  . Acute gouty arthritis 06/11/2015  . CAD (coronary artery disease), native coronary artery 06/11/2015  . Chronic diastolic CHF (congestive heart failure) ([place]) 06/11/2015  . H/O tissue AVR Oct 2016 06/10/2015  . PAF post CABG/AVR- Amiodarone 06/10/2015  . Dyslipidemia 06/10/2015  . S/P CABG x 4-Oct 2016 03/30/2015  . H/O NSTEMI-Oct 2016 03/27/2015  . 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 Depression 09/08/2014  . GERD (gastroesophageal reflux disease)   . Essential tremor 03/31/2014  . Iliotibial band syndrome affecting left lower leg 01/01/2014  . Spondylolisthesis at L4-L5 level 01/01/2014  . Sinus bradycardia 12/31/2013  . Greater trochanteric bursitis of right hip 12/04/2013  . OSA on CPAP   . Diet-controlled diabetes mellitus ([place]) 07/08/2009  . COLONIC POLYPS, HX OF 12/31/2008  . GOUT 12/22/2008  . Essential hypertension 12/22/2008  . Diverticulosis of colon 12/22/2008  . NEPHROLITHIASIS, HX OF 12/22/2008    Darrel Hoover PT 09/18/2017, 8:08 AM  [facility] [address] [place], [place], [address] Phone: [phone]   Fax:  [phone]  Name: Troy Banks MRN: [id number] Date of Birth: [birth year]

## 2017-09-21 ENCOUNTER — Ambulatory Visit: Payer: Medicare Other

## 2017-09-21 DIAGNOSIS — M25572 Pain in left ankle and joints of left foot: Secondary | ICD-10-CM

## 2017-09-21 DIAGNOSIS — R262 Difficulty in walking, not elsewhere classified: Secondary | ICD-10-CM | POA: Diagnosis not present

## 2017-09-21 DIAGNOSIS — M25672 Stiffness of left ankle, not elsewhere classified: Secondary | ICD-10-CM | POA: Diagnosis not present

## 2017-09-21 DIAGNOSIS — R600 Localized edema: Secondary | ICD-10-CM | POA: Diagnosis not present

## 2017-09-21 NOTE — Therapy (Signed)
[place], [place], [address] Phone: [phone]   Fax:  [phone]  Physical Therapy Treatment  Patient Details  Name: Troy Banks MRN: [id number] Date of Birth: [birth year] Referring Provider: Katha Hamming, MD   Encounter Date: 09/21/2017  PT End of Session - 09/21/17 0714    Visit Number  23    Number of Visits  30    Date for PT Re-Evaluation  10/13/17    Authorization Type  MEDICARE    Authorization Time Period  KX at visit 15    PT Start Time  0700    PT Stop Time  0745    PT Time Calculation (min)  45 min    Activity Tolerance  Patient tolerated treatment well;No increased pain    Behavior During Therapy  WFL for tasks assessed/performed       Past Medical History:  Diagnosis Date  . Allergy   . Anxiety    PTSD  . Aortic stenosis, moderate 03/27/2015   post AVR echo Echo 11/16: Mild LVH, EF 55-60%, normal wall motion, grade 1 diastolic dysfunction, bioprosthetic AVR okay (mean gradient 18 mmHg) with trivial AI, mild LAE, atrial septal aneurysm, small effusion   . Arthritis    "qwhere" (10/29/2015)  . Carotid artery stenosis    Carotid US 11/17: 1-39% bilateral ICA stenosis. F/u prn  . Cervical stenosis of spinal canal    fusion 2006  . CHF (congestive heart failure) ([place])    "secondry to OHS"  . Closed left pilon fracture   . COLONIC POLYPS, HX OF   . Complication of anesthesia    "takes a lot to put him under and has woken up during surgery" Difficult to wake up . PTSD; "does not metabolize RX well"Pt. reports that he gets "violent", per pt.   . Diabetes mellitus without complication (HCC)    diet controlled  . DIVERTICULOSIS, COLON   . Dysrhythmia    2 bouts of afib post op and at home but corrected with medication.  . Familial tremor 03/19/2014  . GERD (gastroesophageal reflux disease)    uses Zantac  . GOUT   . Headache   . Heart murmur   . High cholesterol   .  HYPERTENSION    off ACEI 2010 because of hyperkalemia in setting of ARI  . Hypothyroidism    newly diaganosed 09/2015  . Kidney stones    remote open stone-retrieval on L  . Myocardial infarction [facility])    October 20th 2016  . OSA on CPAP   . 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 PTSD (post-traumatic stress disorder)   . Sleep apnea    On a CPAP    Past Surgical History:  Procedure Laterality Date  . ANKLE FUSION Left 03/17/2017   Procedure: FUSION  PILON FRACTURE WITH BONE GRAFTING;  Surgeon: Shona Needles, MD;  Location: [place];  Service: Orthopedics;  Laterality: Left;  . ANTERIOR FUSION CERVICAL SPINE  2002  . AORTIC VALVE REPLACEMENT N/A 03/30/2015   Procedure: AORTIC VALVE REPLACEMENT (AVR);  Surgeon: Grace Isaac, MD;  Location: [facility];  Service: Open Heart Surgery;  Laterality: N/A;  . BACK SURGERY    . CARDIAC CATHETERIZATION N/A 03/26/2015   Procedure: Left Heart Cath and Coronary Angiography;  Surgeon: Leonie Man, MD;  Location: [place] [facility];  Service: Cardiovascular;  Laterality: N/A;  .  CARDIAC VALVE REPLACEMENT    . CORONARY ARTERY BYPASS GRAFT N/A 03/30/2015   Procedure: CORONARY ARTERY BYPASS GRAFTING (CABG) x four, using left internal mammary artery and left    leg greater saphenous vein harvested endoscopically ;  Surgeon: Grace Isaac, MD;  Location: [place];  Service: Open Heart Surgery;  Laterality: N/A;  . CYSTOSCOPY/URETEROSCOPY/HOLMIUM LASER/STENT PLACEMENT Bilateral 11/07/2016   Procedure: CYSTOSCOPY/URETEROSCOPY/HOLMIUM LASER/STENT PLACEMENT;  Surgeon: Nickie Retort, MD;  [facility];  Service: Urology;  Laterality: Bilateral;  . CYSTOSCOPY/URETEROSCOPY/HOLMIUM LASER/STENT PLACEMENT Bilateral 11/23/2016   Procedure: CYSTOSCOPY/BILATERAL URETEROSCOPY/HOLMIUM LASER/STENT EXCHANGE;  Surgeon: Nickie Retort, MD;  [facility];  Service: Urology;  Laterality:  Bilateral;  NEEDS 90 MIN   . EXTERNAL FIXATION LEG Left 02/22/2017   Procedure: EXTERNAL FIXATION LEFT ANKLE;  Surgeon: Renette Butters, MD;  Location: [place];  Service: Orthopedics;  Laterality: Left;  . HEMORRHOID BANDING  1970s  . INGUINAL HERNIA REPAIR Left 1962  . JOINT REPLACEMENT    . [place]   "cut me open"  . POSTERIOR FUSION CERVICAL SPINE  2006  . TEE WITHOUT CARDIOVERSION N/A 03/30/2015   Procedure: TRANSESOPHAGEAL ECHOCARDIOGRAM (TEE);  Surgeon: Grace Isaac, MD;  Location: [place];  Service: Open Heart Surgery;  Laterality: N/A;  . TESTICLE TORSION REDUCTION Right 1960  . TOE FUSION Left 1985 & 2004   "great toe"  . TONSILLECTOMY    . TOTAL HIP ARTHROPLASTY Right 10/27/2015   Procedure: RIGHT TOTAL HIP ARTHROPLASTY ANTERIOR APPROACH and steroid injection right foot;  Surgeon: Mcarthur Rossetti, MD;  Location: [place];  Service: Orthopedics;  Laterality: Right;    There were no vitals filed for this visit.  Subjective Assessment - 09/21/17 0711    Subjective  < 2/10 with on feet alot yesterday    Pain Score  -- 1.5    Pain Location  Ankle    Pain Orientation  Left;Medial    Pain Descriptors / Indicators  Aching    Pain Type  Chronic pain    Pain Onset  More than a month ago    Pain Frequency  Intermittent    Aggravating Factors   active on feet    Pain Relieving Factors  rest    Multiple Pain Sites  No                       [facility] Adult PT Treatment/Exercise - 09/21/17 0001      Therapeutic Activites    Other Therapeutic Activities  woeked on stepping over 12 inch wide and 20 inch heigh and 3 feet log platform for mounting bike.       Neuro Re-ed    Neuro Re-ed Details   tandem walking and SLS activity , stepping over 4 inch bar and 6 inc high and 10 inch wide platform with cue to modify to step with Lt foot angled to decr DF.  and stepping to 03-25-29 inch       Ankle Exercises: Aerobic   Stationary Bike  L1 6 minutes  .[name]        Ankle Exercises: Standing   Other Standing Ankle Exercises  squats with green band around knees and sise steps with band around knees.                PT Short Term Goals - 07/25/17 1017      PT SHORT TERM GOAL #1   Title  He will  be independent with initial HEP     Status  Achieved      PT SHORT TERM GOAL #2   Title  He will be able to walk WBAT with SPC in home    Status  Achieved      PT SHORT TERM GOAL #3   Title  Swelling improved to 59 cm    Status  Achieved        PT Long Term Goals - 09/18/17 0802      PT LONG TERM GOAL #1   Title  He will be independent with all HEP issued    Baseline  He is independent with exercises issued so far    Status  On-going      PT LONG TERM GOAL #2   Title  He will be able to walk with least restricted device community distances    Baseline  cane , no boot in community,  able to walk on uneven ground. using cane less out of home    Status  Achieved      PT LONG TERM GOAL #3   Title  He will return to riding motorcycle if allowed by surgeon    Baseline  has not for multiple reasons    Status  On-going      PT LONG TERM GOAL #4   Title  He will report returning to all home tasks except more challenging activity like getting on roof.    Baseline  Back to everything he was doing prior to Fusion.    Status  Achieved      PT LONG TERM GOAL #5   Title  swelling will improve to no more than 1 cm greater girth than RT    Status  Unable to assess      PT LONG TERM GOAL #6   Title  pain will be intermittant and no more than 1-2/10    Baseline  4/10 last week    Status  Partially Met      PT LONG TERM GOAL #7   Title  FOTO score improved to 20% limited or less    Baseline  decr 7 points from last survey    Status  Unable to assess            Plan - 09/21/17 0713    Clinical Impression Statement  He really appears to be balancing better with narrow BOS and and lifting RT foot over taller items.   Cont times  3-4 weeks to progress balance    PT Frequency  2x / week    PT Duration  4 weeks    PT Treatment/Interventions  Cryotherapy;Vasopneumatic Device;Therapeutic exercise;Therapeutic activities;Gait training;Patient/family education;Passive range of motion;Manual techniques;Taping    PT Next Visit Plan  balance and ambulation progress off SPC as able., Work on hip ROM and single leg balance .  measure edema    PT Home Exercise Plan  edema management.  towel exercise, DF stretch, SLR 3 way , LAQ, knee flex seated green band, hip flexion and abduction stretch in standing. Stepping onto higher steps and practice leg lifting for clearance over bike seat at home    Consulted and Agree with Plan of Care  Patient       Patient will benefit from skilled therapeutic intervention in order to improve the following deficits and impairments:  Pain, Decreased activity tolerance, Decreased balance, Decreased range of motion, Decreased strength, Increased edema, Difficulty walking  Visit Diagnosis: Pain in left ankle and  joints of left foot  Stiffness of left ankle, not elsewhere classified  Localized edema  Difficulty in walking, not elsewhere classified     Problem List Patient Active Problem List   Diagnosis Date Noted  . Pilon fracture 03/17/2017  . Pilon fracture of left tibia 03/17/2017  . At risk for adverse drug reaction 02/28/2017  . Closed fracture dislocation of ankle joint, left, with delayed healing, subsequent encounter 02/22/2017  . Renal stone 10/26/2016  . Gross hematuria 10/26/2016  . Bladder neck obstruction 10/26/2016  . External hemorrhoid, thrombosed 06/10/2016  . Internal hemorrhoids 06/10/2016  . Lumbar back pain 05/19/2016  . Osteoarthritis of right hip 10/27/2015  . Status post total replacement of right hip 10/27/2015  . Chronic chest pain 09/22/2015  . Acute gouty arthritis 06/11/2015  . CAD (coronary artery disease), native coronary artery 06/11/2015  . Chronic  diastolic CHF (congestive heart failure) ([place]) 06/11/2015  . H/O tissue AVR Oct 2016 06/10/2015  . PAF post CABG/AVR- Amiodarone 06/10/2015  . Dyslipidemia 06/10/2015  . S/P CABG x 4-Oct 2016 03/30/2015  . H/O NSTEMI-Oct 2016 03/27/2015  . 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 Depression 09/08/2014  . GERD (gastroesophageal reflux disease)   . Essential tremor 03/31/2014  . Iliotibial band syndrome affecting left lower leg 01/01/2014  . Spondylolisthesis at L4-L5 level 01/01/2014  . Sinus bradycardia 12/31/2013  . Greater trochanteric bursitis of right hip 12/04/2013  . OSA on CPAP   . Diet-controlled diabetes mellitus ([place]) 07/08/2009  . COLONIC POLYPS, HX OF 12/31/2008  . GOUT 12/22/2008  . Essential hypertension 12/22/2008  . Diverticulosis of colon 12/22/2008  . NEPHROLITHIASIS, HX OF 12/22/2008    Troy Banks PT 09/21/2017, 7:53 AM  [facility] [address] [place], [place], [address] Phone: [phone]   Fax:  [phone]  Name: Troy Banks MRN: [id number] Date of Birth: Mar 27, 1941

## 2017-09-25 ENCOUNTER — Ambulatory Visit: Payer: Medicare Other

## 2017-09-25 DIAGNOSIS — M25672 Stiffness of left ankle, not elsewhere classified: Secondary | ICD-10-CM

## 2017-09-25 DIAGNOSIS — R600 Localized edema: Secondary | ICD-10-CM

## 2017-09-25 DIAGNOSIS — R262 Difficulty in walking, not elsewhere classified: Secondary | ICD-10-CM | POA: Diagnosis not present

## 2017-09-25 DIAGNOSIS — M25572 Pain in left ankle and joints of left foot: Secondary | ICD-10-CM | POA: Diagnosis not present

## 2017-09-25 NOTE — Patient Instructions (Signed)
Issued stretching for hip flexion and flexors , adductors  , hip rotation and adduction   All 2x /day 60 sec or more 2-3 reps

## 2017-09-25 NOTE — Therapy (Signed)
[place], [place], [address] Phone: [phone]   Fax:  [phone]  Physical Therapy Treatment  Patient Details  Name: Troy Banks MRN: [id number] Date of Birth: 07-Jul-1940 Referring Provider: Katha Hamming, MD   Encounter Date: 09/25/2017  PT End of Session - 09/25/17 0706    Visit Number  24    Number of Visits  30    Date for PT Re-Evaluation  10/13/17    Authorization Type  MEDICARE    Authorization Time Period  KX at visit 15    PT Start Time  0704    PT Stop Time  0745    PT Time Calculation (min)  41 min    Activity Tolerance  Patient tolerated treatment well;No increased pain    Behavior During Therapy  WFL for tasks assessed/performed       Past Medical History:  Diagnosis Date  . Allergy   . Anxiety    PTSD  . Aortic stenosis, moderate 03/27/2015   post AVR echo Echo 11/16: Mild LVH, EF 55-60%, normal wall motion, grade 1 diastolic dysfunction, bioprosthetic AVR okay (mean gradient 18 mmHg) with trivial AI, mild LAE, atrial septal aneurysm, small effusion   . Arthritis    "qwhere" (10/29/2015)  . Carotid artery stenosis    Carotid US 11/17: 1-39% bilateral ICA stenosis. F/u prn  . Cervical stenosis of spinal canal    fusion 2006  . CHF (congestive heart failure) ([place])    "secondry to OHS"  . Closed left pilon fracture   . COLONIC POLYPS, HX OF   . Complication of anesthesia    "takes a lot to put him under and has woken up during surgery" Difficult to wake up . PTSD; "does not metabolize RX well"Pt. reports that he gets "violent", per pt.   . Diabetes mellitus without complication (HCC)    diet controlled  . DIVERTICULOSIS, COLON   . Dysrhythmia    2 bouts of afib post op and at home but corrected with medication.  . Familial tremor 03/19/2014  . GERD (gastroesophageal reflux disease)    uses Zantac  . GOUT   . Headache   . Heart murmur   . High cholesterol   .  HYPERTENSION    off ACEI 2010 because of hyperkalemia in setting of ARI  . Hypothyroidism    newly diaganosed 09/2015  . Kidney stones    remote open stone-retrieval on L  . Myocardial infarction [facility])    October 20th 2016  . OSA on CPAP   . 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 PTSD (post-traumatic stress disorder)   . Sleep apnea    On a CPAP    Past Surgical History:  Procedure Laterality Date  . ANKLE FUSION Left 03/17/2017   Procedure: FUSION  PILON FRACTURE WITH BONE GRAFTING;  Surgeon: Shona Needles, MD;  Location: [place];  Service: Orthopedics;  Laterality: Left;  . ANTERIOR FUSION CERVICAL SPINE  2002  . AORTIC VALVE REPLACEMENT N/A 03/30/2015   Procedure: AORTIC VALVE REPLACEMENT (AVR);  Surgeon: Grace Isaac, MD;  Location: [place];  Service: Open Heart Surgery;  Laterality: N/A;  . BACK SURGERY    . CARDIAC CATHETERIZATION N/A 03/26/2015   Procedure: Left Heart Cath and Coronary Angiography;  Surgeon: Leonie Man, MD;  Location: [place] [facility];  Service: Cardiovascular;  Laterality: N/A;  .  CARDIAC VALVE REPLACEMENT    . CORONARY ARTERY BYPASS GRAFT N/A 03/30/2015   Procedure: CORONARY ARTERY BYPASS GRAFTING (CABG) x four, using left internal mammary artery and left    leg greater saphenous vein harvested endoscopically ;  Surgeon: Grace Isaac, MD;  Location: [place];  Service: Open Heart Surgery;  Laterality: N/A;  . CYSTOSCOPY/URETEROSCOPY/HOLMIUM LASER/STENT PLACEMENT Bilateral 11/07/2016   Procedure: CYSTOSCOPY/URETEROSCOPY/HOLMIUM LASER/STENT PLACEMENT;  Surgeon: Nickie Retort, MD;  [facility];  Service: Urology;  Laterality: Bilateral;  . CYSTOSCOPY/URETEROSCOPY/HOLMIUM LASER/STENT PLACEMENT Bilateral 11/23/2016   Procedure: CYSTOSCOPY/BILATERAL URETEROSCOPY/HOLMIUM LASER/STENT EXCHANGE;  Surgeon: Nickie Retort, MD;  [facility];  Service: Urology;  Laterality:  Bilateral;  NEEDS 90 MIN   . EXTERNAL FIXATION LEG Left 02/22/2017   Procedure: EXTERNAL FIXATION LEFT ANKLE;  Surgeon: Renette Butters, MD;  Location: [place];  Service: Orthopedics;  Laterality: Left;  . HEMORRHOID BANDING  1970s  . INGUINAL HERNIA REPAIR Left 1962  . JOINT REPLACEMENT    . [place]   "cut me open"  . POSTERIOR FUSION CERVICAL SPINE  2006  . TEE WITHOUT CARDIOVERSION N/A 03/30/2015   Procedure: TRANSESOPHAGEAL ECHOCARDIOGRAM (TEE);  Surgeon: Grace Isaac, MD;  Location: [place];  Service: Open Heart Surgery;  Laterality: N/A;  . TESTICLE TORSION REDUCTION Right 1960  . TOE FUSION Left 1985 & 2004   "great toe"  . TONSILLECTOMY    . TOTAL HIP ARTHROPLASTY Right 10/27/2015   Procedure: RIGHT TOTAL HIP ARTHROPLASTY ANTERIOR APPROACH and steroid injection right foot;  Surgeon: Mcarthur Rossetti, MD;  Location: [place];  Service: Orthopedics;  Laterality: Right;    There were no vitals filed for this visit.  Subjective Assessment - 09/25/17 0705    Subjective  1-2 pain .  no othe issues    Currently in Pain?  Yes    Pain Score  -- 1.5    Pain Location  Ankle    Pain Orientation  Left    Pain Descriptors / Indicators  Aching    Pain Type  Chronic pain    Pain Onset  More than a month ago    Pain Frequency  Intermittent    Aggravating Factors   activity on feet    Pain Relieving Factors  rest    Effect of Pain on Daily Activities  cane at times    Multiple Pain Sites  No         [facility] PT Assessment - 09/25/17 0001      Figure 8 Edema   Figure 8 - Left   56.5 cm      AROM   Left Ankle Dorsiflexion  91    Left Ankle Plantar Flexion  108    Left Ankle Inversion  13    Left Ankle Eversion  9      Flexibility   Soft Tissue Assessment /Muscle Length  yes    Hamstrings  50 degrees bilaterally      Standardized Balance Assessment   Standardized Balance Assessment  -- single leg stand RT 25 sec    LT  3 trials 6 sec best                    [facility] Adult PT Treatment/Exercise - 09/25/17 0001      Knee/Hip Exercises: Stretches   Passive Hamstring Stretch  Right;Left;60 seconds;1 rep    Other Knee/Hip Stretches  Fig 4 ER 60-90 sec RT /LT then adduction  60 sec, then butterfly stretch, then bilateral abduction supine and standing    , then hip flexor stretch supine  all 90 sec or more      Ankle Exercises: Aerobic   Stationary Bike  L1 5 minutes .[name]               PT Education - 09/25/17 0742    Education provided  Yes    Education Details  HEP stretching    Person(s) Educated  Patient    Methods  Explanation;Demonstration;Verbal cues;Tactile cues;Handout    Comprehension  Returned demonstration;Verbalized understanding       PT Short Term Goals - 07/25/17 1017      PT SHORT TERM GOAL #1   Title  He will be independent with initial HEP     Status  Achieved      PT SHORT TERM GOAL #2   Title  He will be able to walk WBAT with SPC in home    Status  Achieved      PT SHORT TERM GOAL #3   Title  Swelling improved to 59 cm    Status  Achieved        PT Long Term Goals - 09/25/17 0751      PT LONG TERM GOAL #1   Title  He will be independent with all HEP issued    Status  On-going      PT LONG TERM GOAL #2   Title  He will be able to walk with least restricted device community distances    Status  Achieved      PT LONG TERM GOAL #3   Title  He will return to riding motorcycle if allowed by surgeon    Status  On-going      PT LONG TERM GOAL #4   Title  He will report returning to all home tasks except more challenging activity like getting on roof.    Status  Achieved      PT LONG TERM GOAL #5   Title  swelling will improve to no more than 1 cm greater girth than RT    Status  Achieved      PT LONG TERM GOAL #6   Title  pain will be intermittant and no more than 1-2/10    Baseline  2-3 past week    Status  Partially Met      PT LONG TERM GOAL #7   Title  FOTO score  improved to 20% limited or less    Baseline  decr 7 points from last survey    Status  On-going            Plan - 09/25/17 0707    Clinical Impression Statement  No changes over weekend.  We spent time on strethcing as I think this is a factor if improved would allow possible return to riding motorcycle.   Edema decr from last measure and  AROM essentially unchanged.     PT Treatment/Interventions  Cryotherapy;Vasopneumatic Device;Therapeutic exercise;Therapeutic activities;Gait training;Patient/family education;Passive range of motion;Manual techniques;Taping    PT Next Visit Plan  ., Work on hip ROM and single leg balance . practice getting on motorcycle mimic     PT Home Exercise Plan  edema management.  towel exercise, DF stretch, SLR 3 way , LAQ, knee flex seated green band, hip flexion and abduction stretch in standing. Stepping onto higher steps and practice leg lifting for clearance over bike seat at home    Consulted  and Agree with Plan of Care  Patient       Patient will benefit from skilled therapeutic intervention in order to improve the following deficits and impairments:  Pain, Decreased activity tolerance, Decreased balance, Decreased range of motion, Decreased strength, Increased edema, Difficulty walking  Visit Diagnosis: Pain in left ankle and joints of left foot  Stiffness of left ankle, not elsewhere classified  Localized edema  Difficulty in walking, not elsewhere classified     Problem List Patient Active Problem List   Diagnosis Date Noted  . Pilon fracture 03/17/2017  . Pilon fracture of left tibia 03/17/2017  . At risk for adverse drug reaction 02/28/2017  . Closed fracture dislocation of ankle joint, left, with delayed healing, subsequent encounter 02/22/2017  . Renal stone 10/26/2016  . Gross hematuria 10/26/2016  . Bladder neck obstruction 10/26/2016  . External hemorrhoid, thrombosed 06/10/2016  . Internal hemorrhoids 06/10/2016  . Lumbar  back pain 05/19/2016  . Osteoarthritis of right hip 10/27/2015  . Status post total replacement of right hip 10/27/2015  . Chronic chest pain 09/22/2015  . Acute gouty arthritis 06/11/2015  . CAD (coronary artery disease), native coronary artery 06/11/2015  . Chronic diastolic CHF (congestive heart failure) ([place]) 06/11/2015  . H/O tissue AVR Oct 2016 06/10/2015  . PAF post CABG/AVR- Amiodarone 06/10/2015  . Dyslipidemia 06/10/2015  . S/P CABG x 4-Oct 2016 03/30/2015  . H/O NSTEMI-Oct 2016 03/27/2015  . 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 Depression 09/08/2014  . GERD (gastroesophageal reflux disease)   . Essential tremor 03/31/2014  . Iliotibial band syndrome affecting left lower leg 01/01/2014  . Spondylolisthesis at L4-L5 level 01/01/2014  . Sinus bradycardia 12/31/2013  . Greater trochanteric bursitis of right hip 12/04/2013  . OSA on CPAP   . Diet-controlled diabetes mellitus ([place]) 07/08/2009  . COLONIC POLYPS, HX OF 12/31/2008  . GOUT 12/22/2008  . Essential hypertension 12/22/2008  . Diverticulosis of colon 12/22/2008  . NEPHROLITHIASIS, HX OF 12/22/2008    Darrel Hoover  PT 09/25/2017, 7:52 AM  [facility] [address] [place], [place], [address] Phone: [phone]   Fax:  [phone]  Name: DIAR BERKEL MRN: [id number] Date of Birth: 07-19-1940

## 2017-09-28 ENCOUNTER — Ambulatory Visit: Payer: Medicare Other

## 2017-09-28 DIAGNOSIS — R600 Localized edema: Secondary | ICD-10-CM | POA: Diagnosis not present

## 2017-09-28 DIAGNOSIS — M25572 Pain in left ankle and joints of left foot: Secondary | ICD-10-CM

## 2017-09-28 DIAGNOSIS — R262 Difficulty in walking, not elsewhere classified: Secondary | ICD-10-CM | POA: Diagnosis not present

## 2017-09-28 DIAGNOSIS — M25672 Stiffness of left ankle, not elsewhere classified: Secondary | ICD-10-CM

## 2017-09-28 NOTE — Therapy (Addendum)
[place], [place], [address] Phone: [phone]   Fax:  [phone]  Physical Therapy Treatment/ Discharge  Patient Details  Name: Troy Banks MRN: [id number] Date of Birth: 1941/01/17 Referring Provider: Katha Hamming, MD   Encounter Date: 09/28/2017  PT End of Session - 09/28/17 0705    Visit Number  25    Number of Visits  30    Date for PT Re-Evaluation  10/13/17    Authorization Type  MEDICARE    Authorization Time Period  KX at visit 15    PT Start Time  0700    PT Stop Time  0750    PT Time Calculation (min)  50 min    Activity Tolerance  Patient tolerated treatment well;No increased pain    Behavior During Therapy  WFL for tasks assessed/performed       Past Medical History:  Diagnosis Date  . Allergy   . Anxiety    PTSD  . Aortic stenosis, moderate 03/27/2015   post AVR echo Echo 11/16: Mild LVH, EF 55-60%, normal wall motion, grade 1 diastolic dysfunction, bioprosthetic AVR okay (mean gradient 18 mmHg) with trivial AI, mild LAE, atrial septal aneurysm, small effusion   . Arthritis    "qwhere" (10/29/2015)  . Carotid artery stenosis    Carotid US 11/17: 1-39% bilateral ICA stenosis. F/u prn  . Cervical stenosis of spinal canal    fusion 2006  . CHF (congestive heart failure) ([place])    "secondry to OHS"  . Closed left pilon fracture   . COLONIC POLYPS, HX OF   . Complication of anesthesia    "takes a lot to put him under and has woken up during surgery" Difficult to wake up . PTSD; "does not metabolize RX well"Pt. reports that he gets "violent", per pt.   . Diabetes mellitus without complication (HCC)    diet controlled  . DIVERTICULOSIS, COLON   . Dysrhythmia    2 bouts of afib post op and at home but corrected with medication.  . Familial tremor 03/19/2014  . GERD (gastroesophageal reflux disease)    uses Zantac  . GOUT   . Headache   . Heart murmur   . High cholesterol    . HYPERTENSION    off ACEI 2010 because of hyperkalemia in setting of ARI  . Hypothyroidism    newly diaganosed 09/2015  . Kidney stones    remote open stone-retrieval on L  . Myocardial infarction [facility])    October 20th 2016  . OSA on CPAP   . 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 PTSD (post-traumatic stress disorder)   . Sleep apnea    On a CPAP    Past Surgical History:  Procedure Laterality Date  . ANKLE FUSION Left 03/17/2017   Procedure: FUSION  PILON FRACTURE WITH BONE GRAFTING;  Surgeon: Shona Needles, MD;  Location: [place];  Service: Orthopedics;  Laterality: Left;  . ANTERIOR FUSION CERVICAL SPINE  2002  . AORTIC VALVE REPLACEMENT N/A 03/30/2015   Procedure: AORTIC VALVE REPLACEMENT (AVR);  Surgeon: Grace Isaac, MD;  Location: [place];  Service: Open Heart Surgery;  Laterality: N/A;  . BACK SURGERY    . CARDIAC CATHETERIZATION N/A 03/26/2015   Procedure: Left Heart Cath and Coronary Angiography;  Surgeon: Leonie Man, MD;  Location: [place] [facility];  Service: Cardiovascular;  Laterality: N/A;  .  CARDIAC VALVE REPLACEMENT    . CORONARY ARTERY BYPASS GRAFT N/A 03/30/2015   Procedure: CORONARY ARTERY BYPASS GRAFTING (CABG) x four, using left internal mammary artery and left    leg greater saphenous vein harvested endoscopically ;  Surgeon: Grace Isaac, MD;  Location: [place];  Service: Open Heart Surgery;  Laterality: N/A;  . CYSTOSCOPY/URETEROSCOPY/HOLMIUM LASER/STENT PLACEMENT Bilateral 11/07/2016   Procedure: CYSTOSCOPY/URETEROSCOPY/HOLMIUM LASER/STENT PLACEMENT;  Surgeon: Nickie Retort, MD;  [facility];  Service: Urology;  Laterality: Bilateral;  . CYSTOSCOPY/URETEROSCOPY/HOLMIUM LASER/STENT PLACEMENT Bilateral 11/23/2016   Procedure: CYSTOSCOPY/BILATERAL URETEROSCOPY/HOLMIUM LASER/STENT EXCHANGE;  Surgeon: Nickie Retort, MD;  [facility];  Service: Urology;   Laterality: Bilateral;  NEEDS 90 MIN   . EXTERNAL FIXATION LEG Left 02/22/2017   Procedure: EXTERNAL FIXATION LEFT ANKLE;  Surgeon: Renette Butters, MD;  Location: [place];  Service: Orthopedics;  Laterality: Left;  . HEMORRHOID BANDING  1970s  . INGUINAL HERNIA REPAIR Left 1962  . JOINT REPLACEMENT    . [place]   "cut me open"  . POSTERIOR FUSION CERVICAL SPINE  2006  . TEE WITHOUT CARDIOVERSION N/A 03/30/2015   Procedure: TRANSESOPHAGEAL ECHOCARDIOGRAM (TEE);  Surgeon: Grace Isaac, MD;  Location: [place];  Service: Open Heart Surgery;  Laterality: N/A;  . TESTICLE TORSION REDUCTION Right 1960  . TOE FUSION Left 1985 & 2004   "great toe"  . TONSILLECTOMY    . TOTAL HIP ARTHROPLASTY Right 10/27/2015   Procedure: RIGHT TOTAL HIP ARTHROPLASTY ANTERIOR APPROACH and steroid injection right foot;  Surgeon: Mcarthur Rossetti, MD;  Location: [place];  Service: Orthopedics;  Laterality: Right;    There were no vitals filed for this visit.  Subjective Assessment - 09/28/17 0704    Subjective  3/10 soreness anterior lower leg to ankle. Also heel pain distal plantar aspect heel. . He has a podiatrist so suggested he talk to them about support/option    Pain Score  3     Pain Location  Leg    Pain Orientation  Left;Anterior    Pain Descriptors / Indicators  Aching    Pain Type  Chronic pain    Pain Onset  More than a month ago    Pain Frequency  Intermittent                       [facility] Adult PT Treatment/Exercise - 09/28/17 0001      Knee/Hip Exercises: Stretches   Passive Hamstring Stretch  Right;Left;60 seconds;1 rep    Other Knee/Hip Stretches  Fig 4 ER 60-90 sec RT /LT then adduction 60 sec, then butterfly stretch, then bilateral abduction supine and standing    , then hip flexor stretch supine  all 90 sec or more      Modalities   Modalities  Moist Heat      Moist Heat Therapy   Number Minutes Moist Heat  10 Minutes    Moist Heat Location   -- LT anterior tib      Manual Therapy   Soft tissue mobilization  anterior LT lower leg with finger but more with tennis ball to anterior tib. Tenderness decr      Ankle Exercises: Aerobic   Stationary Bike  L1 5 minutes .[name]                 PT Short Term Goals - 07/25/17 1017      PT SHORT TERM GOAL #1  Title  He will be independent with initial HEP     Status  Achieved      PT SHORT TERM GOAL #2   Title  He will be able to walk WBAT with SPC in home    Status  Achieved      PT SHORT TERM GOAL #3   Title  Swelling improved to 59 cm    Status  Achieved        PT Long Term Goals - 09/25/17 0751      PT LONG TERM GOAL #1   Title  He will be independent with all HEP issued    Status  On-going      PT LONG TERM GOAL #2   Title  He will be able to walk with least restricted device community distances    Status  Achieved      PT LONG TERM GOAL #3   Title  He will return to riding motorcycle if allowed by surgeon    Status  On-going      PT LONG TERM GOAL #4   Title  He will report returning to all home tasks except more challenging activity like getting on roof.    Status  Achieved      PT LONG TERM GOAL #5   Title  swelling will improve to no more than 1 cm greater girth than RT    Status  Achieved      PT LONG TERM GOAL #6   Title  pain will be intermittant and no more than 1-2/10    Baseline  2-3 past week    Status  Partially Met      PT LONG TERM GOAL #7   Title  FOTO score improved to 20% limited or less    Baseline  decr 7 points from last survey    Status  On-going            Plan - 09/28/17 0705    Clinical Impression Statement  tenderness in anterior tib eased with manually.  able to stretch correctly.   Will resume balance activity next visit. And stretching , Manual and heat if needed for ant lower leg pain    PT Treatment/Interventions  Cryotherapy;Vasopneumatic Device;Therapeutic exercise;Therapeutic activities;Gait  training;Patient/family education;Passive range of motion;Manual techniques;Taping    PT Next Visit Plan  stretching ,balance  , manual if needed ant tib, CHECK LEG LENGTH    PT Home Exercise Plan  edema management.  towel exercise, DF stretch, SLR 3 way , LAQ, knee flex seated green band, hip flexion and abduction stretch in standing. Stepping onto higher steps and practice leg lifting for clearance over bike seat at home    Consulted and Agree with Plan of Care  Patient       Patient will benefit from skilled therapeutic intervention in order to improve the following deficits and impairments:  Pain, Decreased activity tolerance, Decreased balance, Decreased range of motion, Decreased strength, Increased edema, Difficulty walking  Visit Diagnosis: Pain in left ankle and joints of left foot  Stiffness of left ankle, not elsewhere classified  Localized edema  Difficulty in walking, not elsewhere classified     Problem List Patient Active Problem List   Diagnosis Date Noted  . Pilon fracture 03/17/2017  . Pilon fracture of left tibia 03/17/2017  . At risk for adverse drug reaction 02/28/2017  . Closed fracture dislocation of ankle joint, left, with delayed healing, subsequent encounter 02/22/2017  . Renal stone 10/26/2016  .  Gross hematuria 10/26/2016  . Bladder neck obstruction 10/26/2016  . External hemorrhoid, thrombosed 06/10/2016  . Internal hemorrhoids 06/10/2016  . Lumbar back pain 05/19/2016  . Osteoarthritis of right hip 10/27/2015  . Status post total replacement of right hip 10/27/2015  . Chronic chest pain 09/22/2015  . Acute gouty arthritis 06/11/2015  . CAD (coronary artery disease), native coronary artery 06/11/2015  . Chronic diastolic CHF (congestive heart failure) ([place]) 06/11/2015  . H/O tissue AVR Oct 2016 06/10/2015  . PAF post CABG/AVR- Amiodarone 06/10/2015  . Dyslipidemia 06/10/2015  . S/P CABG x 4-Oct 2016 03/30/2015  . H/O NSTEMI-Oct 2016 03/27/2015   . 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 Depression 09/08/2014  . GERD (gastroesophageal reflux disease)   . Essential tremor 03/31/2014  . Iliotibial band syndrome affecting left lower leg 01/01/2014  . Spondylolisthesis at L4-L5 level 01/01/2014  . Sinus bradycardia 12/31/2013  . Greater trochanteric bursitis of right hip 12/04/2013  . OSA on CPAP   . Diet-controlled diabetes mellitus ([place]) 07/08/2009  . COLONIC POLYPS, HX OF 12/31/2008  . GOUT 12/22/2008  . Essential hypertension 12/22/2008  . Diverticulosis of colon 12/22/2008  . NEPHROLITHIASIS, HX OF 12/22/2008    Darrel Hoover  PT 09/28/2017, 7:56 AM  [facility] [address] [place], [place], [address] Phone: [phone]   Fax:  [phone]  Name: DOROTEO NICKOLSON MRN: [id number] Date of Birth: 23-Jul-1940  PHYSICAL THERAPY DISCHARGE SUMMARY  Visits from Start of Care: 25  Current functional level related to goals / functional outcomes: See above . He did not return after this session   Remaining deficits: See above.  Still with decr balance but significantly improved. Goal of getting on and off motorcycle was not met due to decr balance   Education / Equipment: HEP for strength and balance  Plan:                                                    Patient goals were met. Patient is being discharged due to not returning since the last visit.  ?????   Pearson Forster  PT 11/07/17

## 2017-10-14 ENCOUNTER — Other Ambulatory Visit: Payer: Self-pay | Admitting: Internal Medicine

## 2017-10-27 ENCOUNTER — Ambulatory Visit (INDEPENDENT_AMBULATORY_CARE_PROVIDER_SITE_OTHER): Payer: Medicare Other | Admitting: Internal Medicine

## 2017-10-27 ENCOUNTER — Encounter: Payer: Self-pay | Admitting: Internal Medicine

## 2017-10-27 VITALS — BP 180/82 | HR 62 | Ht 68.0 in | Wt 206.0 lb

## 2017-10-27 DIAGNOSIS — I10 Essential (primary) hypertension: Secondary | ICD-10-CM | POA: Diagnosis not present

## 2017-10-27 DIAGNOSIS — E785 Hyperlipidemia, unspecified: Secondary | ICD-10-CM | POA: Diagnosis not present

## 2017-10-27 DIAGNOSIS — Z86010 Personal history of colonic polyps: Secondary | ICD-10-CM

## 2017-10-27 DIAGNOSIS — E119 Type 2 diabetes mellitus without complications: Secondary | ICD-10-CM | POA: Diagnosis not present

## 2017-10-27 DIAGNOSIS — N320 Bladder-neck obstruction: Secondary | ICD-10-CM | POA: Diagnosis not present

## 2017-10-27 DIAGNOSIS — Z23 Encounter for immunization: Secondary | ICD-10-CM | POA: Diagnosis not present

## 2017-10-27 NOTE — Progress Notes (Signed)
Subjective:    Patient ID: Troy Banks, male    DOB: December 08, 1940, 77 y.o.   MRN: [id number]  HPI  Here for yearly f/u with wife;  Overall doing ok;  Pt denies Chest pain, worsening SOB, DOE, wheezing, orthopnea, PND, worsening LE edema, palpitations, dizziness or syncope.  Pt denies neurological change such as new headache, facial or extremity weakness.  Pt denies polydipsia, polyuria, or low sugar symptoms. Pt states overall good compliance with treatment and medications, good tolerability, and has been trying to follow appropriate diet.  Pt denies worsening depressive symptoms, suicidal ideation or panic. No fever, night sweats, wt loss, loss of appetite, or other constitutional symptoms.  Pt states good ability with ADL's, has low fall risk, home safety reviewed and adequate, no other significant changes in hearing or vision, and only occasionally active with exercise.  [facility] Has known chronic microhematuria, Denies urinary symptoms such as dysuria, frequency, urgency, flank pain, hematuria or n/v, fever, chills.  States BP at home < 140/90 Past Medical History:  Diagnosis Date  . Allergy   . Anxiety    PTSD  . Aortic stenosis, moderate 03/27/2015   post AVR echo Echo 11/16: Mild LVH, EF 55-60%, normal wall motion, grade 1 diastolic dysfunction, bioprosthetic AVR okay (mean gradient 18 mmHg) with trivial AI, mild LAE, atrial septal aneurysm, small effusion   . Arthritis    "qwhere" (10/29/2015)  . Carotid artery stenosis    Carotid US 11/17: 1-39% bilateral ICA stenosis. F/u prn  . Cervical stenosis of spinal canal    fusion 2006  . CHF (congestive heart failure) ([place])    "secondry to OHS"  . Closed left pilon fracture   . COLONIC POLYPS, HX OF   . Complication of anesthesia    "takes a lot to put him under and has woken up during surgery" Difficult to wake up . PTSD; "does not metabolize RX well"Pt. reports that he gets "violent", per pt.   .  Diabetes mellitus without complication (HCC)    diet controlled  . DIVERTICULOSIS, COLON   . Dysrhythmia    2 bouts of afib post op and at home but corrected with medication.  . Familial tremor 03/19/2014  . GERD (gastroesophageal reflux disease)    uses Zantac  . GOUT   . Headache   . Heart murmur   . High cholesterol   . HYPERTENSION    off ACEI 2010 because of hyperkalemia in setting of ARI  . Hypothyroidism    newly diaganosed 09/2015  . Kidney stones    remote open stone-retrieval on L  . Myocardial infarction [facility])    October 20th 2016  . OSA on CPAP   . 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 PTSD (post-traumatic stress disorder)   . Sleep apnea    On a CPAP   Past Surgical History:  Procedure Laterality Date  . ANKLE FUSION Left 03/17/2017   Procedure: FUSION  PILON FRACTURE WITH BONE GRAFTING;  Surgeon: Shona Needles, MD;  Location: [place];  Service: Orthopedics;  Laterality: Left;  . ANTERIOR FUSION CERVICAL SPINE  2002  . AORTIC VALVE REPLACEMENT N/A 03/30/2015   Procedure: AORTIC VALVE REPLACEMENT (AVR);  Surgeon: Grace Isaac, MD;  Location: [place];  Service: Open Heart Surgery;  Laterality: N/A;  . BACK SURGERY    . CARDIAC CATHETERIZATION N/A 03/26/2015  Procedure: Left Heart Cath and Coronary Angiography;  Surgeon: Leonie Man, MD;  Location: [place] [facility];  Service: Cardiovascular;  Laterality: N/A;  . CARDIAC VALVE REPLACEMENT    . CORONARY ARTERY BYPASS GRAFT N/A 03/30/2015   Procedure: CORONARY ARTERY BYPASS GRAFTING (CABG) x four, using left internal mammary artery and left    leg greater saphenous vein harvested endoscopically ;  Surgeon: Grace Isaac, MD;  Location: [place];  Service: Open Heart Surgery;  Laterality: N/A;  . CYSTOSCOPY/URETEROSCOPY/HOLMIUM LASER/STENT PLACEMENT Bilateral 11/07/2016   Procedure: CYSTOSCOPY/URETEROSCOPY/HOLMIUM LASER/STENT PLACEMENT;   Surgeon: Nickie Retort, MD;  [facility];  Service: Urology;  Laterality: Bilateral;  . CYSTOSCOPY/URETEROSCOPY/HOLMIUM LASER/STENT PLACEMENT Bilateral 11/23/2016   Procedure: CYSTOSCOPY/BILATERAL URETEROSCOPY/HOLMIUM LASER/STENT EXCHANGE;  Surgeon: Nickie Retort, MD;  [facility];  Service: Urology;  Laterality: Bilateral;  NEEDS 90 MIN   . EXTERNAL FIXATION LEG Left 02/22/2017   Procedure: EXTERNAL FIXATION LEFT ANKLE;  Surgeon: Renette Butters, MD;  Location: [place];  Service: Orthopedics;  Laterality: Left;  . HEMORRHOID BANDING  1970s  . INGUINAL HERNIA REPAIR Left 1962  . JOINT REPLACEMENT    . [place]   "cut me open"  . POSTERIOR FUSION CERVICAL SPINE  2006  . TEE WITHOUT CARDIOVERSION N/A 03/30/2015   Procedure: TRANSESOPHAGEAL ECHOCARDIOGRAM (TEE);  Surgeon: Grace Isaac, MD;  Location: [place];  Service: Open Heart Surgery;  Laterality: N/A;  . TESTICLE TORSION REDUCTION Right 1960  . TOE FUSION Left 1985 & 2004   "great toe"  . TONSILLECTOMY    . TOTAL HIP ARTHROPLASTY Right 10/27/2015   Procedure: RIGHT TOTAL HIP ARTHROPLASTY ANTERIOR APPROACH and steroid injection right foot;  Surgeon: Mcarthur Rossetti, MD;  Location: [place];  Service: Orthopedics;  Laterality: Right;    reports that he quit smoking about 27 years ago. His smoking use included cigarettes. He has a 34.00 pack-year smoking history. He has never used smokeless tobacco. He reports that he drinks about 0.6 oz of alcohol per week. He reports that he does not use drugs. family history includes Alcohol abuse in his other; Arthritis in his other; Diabetes in his mother and paternal grandfather; Heart disease in his father, maternal grandfather, maternal grandmother, mother, and paternal grandfather; Hypertension in his father, maternal grandfather, maternal grandmother, mother, and paternal grandfather; Prostate cancer in his maternal uncle. Allergies  Allergen Reactions  .  Benzodiazepines Other (See Comments)    Patient states he gets stroke symptoms with benzo's.   . Coreg [Carvedilol] Other (See Comments)    Accumulates and causes stroke-like side-effects due to 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 Patient poor metabolizer of CYP2D6 - Coreg undergoes extensive hepatic (including 2D6)  . Lisinopril Other (See Comments)    Hyperkalemia. 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  . Mobic [Meloxicam] Other (See Comments)    Accumulates and causes stroke-like side-effects due to 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  . Sertraline Hcl Other (See Comments)    Accumulates and causes stroke-like side-effects due to 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  . Tramadol Other (See Comments)    Accumulates and causes stroke-like side-effects due to 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  . Atorvastatin Other (See Comments)    MYALGIAS  . Colchicine Other (See Comments)    ataxia  . Compazine [Prochlorperazine Maleate] Other (See Comments)    Accumulates and causes stroke-like side-effects due to 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  . Flomax [Tamsulosin Hcl]  Accumulates and causes stroke-like side-effects due to 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  . Ondansetron Other (See Comments)    Accumulates and causes stroke-like side-effects due to 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   . Promethazine Other (See Comments)    Accumulates and causes stroke-like side-effects due to 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  . Acyclovir And Related Other (See Comments)    Accumulates and causes stroke-like side-effects due to 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   Current Outpatient Medications on File Prior to Visit  Medication Sig Dispense Refill  . acetaminophen (TYLENOL) 500 MG tablet Take 2 tablets (1,000 mg total) by mouth every 6 (six) hours. 90 tablet 2  . allopurinol (ZYLOPRIM) 300 MG tablet TAKE 1 TABLET BY MOUTH EVERY DAY 90 tablet 0  . amLODipine  (NORVASC) 10 MG tablet TAKE 1 TABLET BY MOUTH EVERY DAY 90 tablet 0  . amoxicillin (AMOXIL) 500 MG capsule Take 500 mg by mouth as directed. Take 4 caps 30-60 minutes prior to dental procedure.    [name] amoxicillin (AMOXIL) 500 MG capsule Take 500 mg by mouth 3 (three) times daily.    . Ascorbic Acid (VITAMIN C) 1000 MG tablet Take 1,000 mg by mouth 2 (two) times daily. (0900 & 2100)    . aspirin 81 MG chewable tablet Chew 81 mg by mouth daily. (0900)    . Carboxymethylcellulose Sodium (LUBRICANT EYE DROPS OP) Place 1 drop into both eyes at bedtime.    . cholecalciferol (VITAMIN D) 1000 units tablet Take 1,000 Units by mouth 2 (two) times daily. (0900 & 2100)    . furosemide (LASIX) 40 MG tablet TAKE 1 TABLET BY MOUTH EVERY OTHER DAY 45 tablet 3  . levothyroxine (SYNTHROID, LEVOTHROID) 25 MCG tablet TAKE 1 TABLET BY MOUTH EVERY DAY BEFORE BREAKFAST 90 tablet 2  . polyethylene glycol (MIRALAX / GLYCOLAX) packet Take 17 g by mouth daily. 14 each 0  . primidone (MYSOLINE) 50 MG tablet TAKE 3 TABLETS BY MOUTH IN THE MORNING AND TAKE 2 TABLETS BY MOUTH IN THE EVENING 450 tablet 1  . ranitidine (ZANTAC) 150 MG tablet TAKE 1 TABLET BY MOUTH 2 TIMES DAILY 180 tablet 1  . rosuvastatin (CRESTOR) 10 MG tablet TAKE 1/2 TABLET BY MOUTH DAILY 45 tablet 1  . tiZANidine (ZANAFLEX) 4 MG tablet Take 1 tablet (4 mg total) by mouth every 8 (eight) hours as needed for muscle spasms. 60 tablet 0  . Turmeric 500 MG CAPS Take 500 mg by mouth 2 (two) times daily. (0900 & 2100)    . vitamin B-12 (CYANOCOBALAMIN) 1000 MCG tablet Take 1,000 mcg by mouth 2 (two) times daily. (0900 & 2100)     No current facility-administered medications on file prior to visit.    Review of Systems Constitutional: Negative for other unusual diaphoresis, sweats, appetite or weight changes HENT: Negative for other worsening hearing loss, ear pain, facial swelling, mouth sores or neck stiffness.   Eyes: Negative for other worsening pain, redness  or other visual disturbance.  Respiratory: Negative for other stridor or swelling Cardiovascular: Negative for other palpitations or other chest pain  Gastrointestinal: Negative for worsening diarrhea or loose stools, blood in stool, distention or other pain Genitourinary: Negative for hematuria, flank pain or other change in urine volume.  Musculoskeletal: Negative for myalgias or other joint swelling.  Skin: Negative for other color change, or other wound or worsening drainage.  Neurological: Negative for other syncope or numbness. Hematological: Negative for other adenopathy or swelling Psychiatric/Behavioral: Negative for hallucinations, other worsening agitation,  SI, self-injury, or new decreased concentration All other system neg per pt    Objective:   Physical Exam BP (!) 180/82 (BP Location: Left Arm, Patient Position: Sitting, Cuff Size: Large)   Pulse 62   Ht 5\' 8"  (1.727 m)   Wt 206 lb (93.4 kg)   SpO2 96%   BMI 31.32 kg/m  VS noted,  Constitutional: Pt is oriented to person, place, and time. Appears well-developed and well-nourished, in no significant distress and comfortable Head: Normocephalic and atraumatic  Eyes: Conjunctivae and EOM are normal. Pupils are equal, round, and reactive to light Right Ear: External ear normal without discharge Left Ear: External ear normal without discharge Nose: Nose without discharge or deformity Mouth/Throat: Oropharynx is without other ulcerations and moist  Neck: Normal range of motion. Neck supple. No JVD present. No tracheal deviation present or significant neck LA or mass Cardiovascular: Normal rate, regular rhythm, normal heart sounds and intact distal pulses.   Pulmonary/Chest: WOB normal and breath sounds without rales or wheezing  Abdominal: Soft. Bowel sounds are normal. NT. No HSM  Musculoskeletal: Normal range of motion. Exhibits no edema Lymphadenopathy: Has no other cervical adenopathy.  Neurological: Pt is alert and  oriented to person, place, and time. Pt has normal reflexes. No cranial nerve deficit. Motor grossly intact, Gait intact Skin: Skin is warm and dry. No rash noted or new ulcerations Psychiatric:  Has normal mood and affect. Behavior is normal without agitation] No other exam findings    Assessment & Plan:

## 2017-10-27 NOTE — Patient Instructions (Addendum)
You will be contacted regarding the referral for: colonoscopy with Dr Cheral Bay had the pneumovax pneumonia shot today  Please continue all other medications as before, and refills have been done if requested.  Please have the pharmacy call with any other refills you may need.  Please continue your efforts at being more active, low cholesterol diet, and weight control.  You are otherwise up to date with prevention measures today.  Please keep your appointments with your specialists as you may have planned  Please go to the LAB in the Basement (turn left off the elevator) for the tests to be done at your convenience  You will be contacted by phone if any changes need to be made immediately.  Otherwise, you will receive a letter about your results with an explanation, but please check with MyChart first.  Please remember to sign up for MyChart if you have not done so, as this will be important to you in the future with finding out test results, communicating by private email, and scheduling acute appointments online when needed.  Please return in 1 year for your yearly visit, or sooner if needed

## 2017-10-28 NOTE — Assessment & Plan Note (Signed)
stable overall by history and exam, recent data reviewed with pt, and pt to continue medical treatment as before - refuses change today,  to f/u any worsening symptoms or concerns BP Readings from Last 3 Encounters:  10/27/17 (!) 180/82  07/25/17 128/80  05/01/17 (!) 151/76

## 2017-10-28 NOTE — Progress Notes (Signed)
11/01/2017 Troy Banks   April 10, 1941  [id number]  Primary Physician Troy Borg, MD Primary Cardiologist: Dr Troy Banks  HPI:  77 y.o. Male seen for evaluation of chest pain. He was admitted 10/19-10/31/16 with a non-STEMI. LHC demonstrated 3 vessel CAD. He also had significant aortic stenosis.  He underwent CABG with LIMA-LAD, SVG-intermediate, SVG-LCx, SVG-distal RCA and bioprosthetic AVR. Atenolol was initiated but later stopped secondary to bradycardia. He did develop atrial fibrillation. He was placed on amiodarone with return to NSR. He has maintained NSR and has recovered well. He has an essential tremor and is on Primidone chronically. 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 Amiodarone was stopped in January 2017 at the time he was hospitalized for an acute gout attack in his [place]. He has maintained NSR.  His wife keeps close check on him. She is a retired Quarry manager. He has a daughter who is a family physician in [place].  In May 2018 he complained of more dyspnea on exertion and some chest tightness.  CXR was clear. Myoview study was normal.  In June he underwent cystoscopy and lithotripsy for renal stones.   In September he fell off his roof fracturing his left ankle and had a compression fracture of L2. He required extensive surgery of his ankle.   On follow up today he is doing well. BP is well controlled except when he feels stressed. He is making good recovery from his ankle surgery and is walking. No chest pain or dyspnea. No palpitations.   Current Outpatient Medications  Medication Sig Dispense Refill  . acetaminophen (TYLENOL) 500 MG tablet Take 2 tablets (1,000 mg total) by mouth every 6 (six) hours. 90 tablet 2  . allopurinol (ZYLOPRIM) 300 MG tablet TAKE 1 TABLET BY MOUTH EVERY DAY 90 tablet 0  . amLODipine (NORVASC) 10 MG tablet TAKE 1 TABLET BY MOUTH EVERY DAY 90 tablet 0  .  amoxicillin-clavulanate (AUGMENTIN) 875-125 MG tablet Take 1 tablet by mouth 2 (two) times daily. 20 tablet 0  . Ascorbic Acid (VITAMIN C) 1000 MG tablet Take 1,000 mg by mouth 2 (two) times daily. (0900 & 2100)    . aspirin 81 MG chewable tablet Chew 81 mg by mouth daily. (0900)    . Carboxymethylcellulose Sodium (LUBRICANT EYE DROPS OP) Place 1 drop into both eyes at bedtime.    . cholecalciferol (VITAMIN D) 1000 units tablet Take 1,000 Units by mouth 2 (two) times daily. (0900 & 2100)    . furosemide (LASIX) 40 MG tablet TAKE 1 TABLET BY MOUTH EVERY OTHER DAY 45 tablet 3  . levothyroxine (SYNTHROID, LEVOTHROID) 25 MCG tablet TAKE 1 TABLET BY MOUTH EVERY DAY BEFORE BREAKFAST 90 tablet 2  . polyethylene glycol (MIRALAX / GLYCOLAX) packet Take 17 g by mouth daily. 14 each 0  . primidone (MYSOLINE) 50 MG tablet TAKE 3 TABLETS BY MOUTH IN THE MORNING AND TAKE 2 TABLETS BY MOUTH IN THE EVENING 450 tablet 1  . ranitidine (ZANTAC) 150 MG tablet TAKE 1 TABLET BY MOUTH 2 TIMES DAILY 180 tablet 1  . rosuvastatin (CRESTOR) 10 MG tablet TAKE 1/2 TABLET BY MOUTH DAILY 45 tablet 1  . tiZANidine (ZANAFLEX) 4 MG tablet Take 1 tablet (4 mg total) by mouth every 8 (eight) hours as needed for muscle spasms. 60 tablet 0  . Turmeric 500 MG CAPS Take 500 mg by mouth 2 (two) times daily. (0900 & 2100)    .  vitamin B-12 (CYANOCOBALAMIN) 1000 MCG tablet Take 1,000 mcg by mouth 2 (two) times daily. (0900 & 2100)     No current facility-administered medications for this visit.     Allergies  Allergen Reactions  . Benzodiazepines Other (See Comments)    Patient states he gets stroke symptoms with benzo's.   . Coreg [Carvedilol] Other (See Comments)    Accumulates and causes stroke-like side-effects due to 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 Patient poor metabolizer of CYP2D6 - Coreg undergoes extensive hepatic (including 2D6)  . Lisinopril Other (See Comments)    Hyperkalemia. Accumulates and causes stroke-like  side-effects due to 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.  . Mobic [Meloxicam] Other (See Comments)    Accumulates and causes stroke-like side-effects due to 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  . Sertraline Hcl Other (See Comments)    Accumulates and causes stroke-like side-effects due to 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  . Tramadol Other (See Comments)    Accumulates and causes stroke-like side-effects due to 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  . Atorvastatin Other (See Comments)    MYALGIAS  . Colchicine Other (See Comments)    ataxia  . Compazine [Prochlorperazine Maleate] Other (See Comments)    Accumulates and causes stroke-like side-effects due to 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  . Flomax [Tamsulosin Hcl]     Accumulates and causes stroke-like side-effects due to 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  . Ondansetron Other (See Comments)    Accumulates and causes stroke-like side-effects due to 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   . Promethazine Other (See Comments)    Accumulates and causes stroke-like side-effects due to 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  . Acyclovir And Related Other (See Comments)    Accumulates and causes stroke-like side-effects due to 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    Social History   Socioeconomic History  . Marital status: Single    Spouse name: Troy Bongo, RN  . Number of children: 2  . Years of education: Not on file  . Highest education level: Not on file  Occupational History  . Occupation: Retired Wellsite geologist  . Financial resource strain: Not on file  . Food insecurity:    Worry: Not on file    Inability: Not on file  . Transportation needs:    Medical: Not on file    Non-medical: Not on file  Tobacco Use  . Smoking status: Former Smoker    Packs/day: 1.00    Years: 34.00    Pack years: 34.00    Types: Cigarettes    Last attempt to quit: 06/06/1990    Years since quitting: 27.4  . Smokeless tobacco: Never Used    . Tobacco comment: widowed 1991 - lives with SO Troy Banks who is RN  Substance and Sexual Activity  . Alcohol use: Yes    Alcohol/week: 0.6 oz    Types: 1 Glasses of wine per week    Comment: occasional  . Drug use: No  . Sexual activity: Yes  Lifestyle  . Physical activity:    Days per week: Not on file    Minutes per session: Not on file  . Stress: Not on file  Relationships  . Social connections:    Talks on phone: Not on file    Gets together: Not on file    Attends religious service: Not on file    Active member of club or organization: Not on file    Attends meetings of clubs or organizations: Not on file    Relationship status: Not on file  .  Intimate partner violence:    Fear of current or ex partner: Not on file    Emotionally abused: Not on file    Physically abused: Not on file    Forced sexual activity: Not on file  Other Topics Concern  . Not on file  Social History Narrative  . Not on file     Review of Systems: As noted in HPI All other systems reviewed and are otherwise negative except as noted above.    Blood pressure (!) 102/52, pulse (!) 57, height 5\' 8"  (1.727 m), weight 205 lb (93 kg).  GENERAL:  Well appearing WM in NAD HEENT:  PERRL, EOMI, sclera are clear. Oropharynx is clear. NECK:  No jugular venous distention, carotid upstroke brisk and symmetric, no bruits, no thyromegaly or adenopathy LUNGS:  Clear to auscultation bilaterally CHEST:  Unremarkable, old sternotomy scar HEART:  RRR,  PMI not displaced or sustained,S1 and S2 within normal limits, no S3, no S4: no clicks, no rubs, no murmurs ABD:  Soft, nontender. BS +, no masses or bruits. No hepatomegaly, no splenomegaly EXT:  2 + pulses throughout, Tr edema on left- left calf is larger than right., no cyanosis no clubbing SKIN:  Warm and dry.  No rashes NEURO:  Alert and oriented x 3. Cranial nerves II through XII intact. PSYCH:  Cognitively intact    Lab Results  Component Value Date    WBC 8.3 10/31/2017   HGB 14.2 10/31/2017   HCT 42.1 10/31/2017   PLT 149.0 (L) 10/31/2017   GLUCOSE 107 (H) 10/31/2017   CHOL 115 10/31/2017   TRIG 165.0 (H) 10/31/2017   HDL 37.50 (L) 10/31/2017   LDLDIRECT 100.0 09/08/2014   LDLCALC 45 10/31/2017   ALT 12 10/31/2017   AST 14 10/31/2017   NA 140 10/31/2017   K 3.5 10/31/2017   CL 104 10/31/2017   CREATININE 0.84 10/31/2017   BUN 19 10/31/2017   CO2 26 10/31/2017   TSH 2.24 10/31/2017   PSA 1.22 10/31/2017   INR 1.47 03/30/2015   HGBA1C 5.6 10/31/2017   MICROALBUR 4.3 (H) 10/31/2017    Echo 04/24/15: Study Conclusions  - Left ventricle: The cavity size was normal. Wall thickness was   increased in a pattern of mild LVH. Systolic function was normal.   The estimated ejection fraction was in the range of 55% to 60%.   Wall motion was normal; there were no regional wall motion   abnormalities. Doppler parameters are consistent with abnormal   left ventricular relaxation (grade 1 diastolic dysfunction). - Aortic valve: A bioprosthesis was present. There was trivial   regurgitation. - Left atrium: The atrium was mildly dilated. - Atrial septum: There was an atrial septal aneurysm. - Pericardium, extracardiac: A small pericardial effusion was   identified.  Impressions:  - Normal LV systolic function; grade 1 diastolic dysfunction; mild   LVH; s/p AVR with trace AI and mean gradient of 18 mmHg; mild   LAE; trace MR.  Myoview 10/22/16: Study Highlights     The left ventricular ejection fraction is mildly decreased (45-54%).  Nuclear stress EF: 53%.  No T wave inversion was noted during stress.  There was no ST segment deviation noted during stress.  This is a low risk study.   No reversible ischemia. LVEF 53% with normal wall motion. This is a low risk study.      ASSESSMENT AND PLAN:  1.  CAD s/p CABG in October 2016 following NSTEMI.   Myoview negative  in May 2018. Continue statin. He remains  asymptomatic. Not on beta blocker due to bradycardia.  2. S/p bioprosthetic AVR for aortic stenosis. On routine SBE prophylaxis. Normal valve sound on exam. 3. Afib. Resolved. 4. Hyperlipidemia. Satisfactory on Crestor 5 mg daily 5. HTN. Well controlled today.   6. Sternal scar keloid and pain- chronic.  7. 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   Yatzil Clippinger [name] MD,FACC 11/01/2017 4:06 PM

## 2017-10-28 NOTE — Assessment & Plan Note (Signed)
Lab Results  Component Value Date   LDLCALC 68 10/26/2016   stable overall by history and exam, recent data reviewed with pt, and pt to continue medical treatment as before,  to f/u any worsening symptoms or concerns

## 2017-10-28 NOTE — Assessment & Plan Note (Signed)
stable overall by history and exam, recent data reviewed with pt, and pt to continue medical treatment as before,  to f/u any worsening symptoms or concerns Lab Results  Component Value Date   HGBA1C 5.9 10/26/2016

## 2017-10-31 ENCOUNTER — Other Ambulatory Visit (INDEPENDENT_AMBULATORY_CARE_PROVIDER_SITE_OTHER): Payer: Medicare Other

## 2017-10-31 ENCOUNTER — Ambulatory Visit (INDEPENDENT_AMBULATORY_CARE_PROVIDER_SITE_OTHER): Payer: Medicare Other | Admitting: Internal Medicine

## 2017-10-31 ENCOUNTER — Encounter: Payer: Self-pay | Admitting: Internal Medicine

## 2017-10-31 VITALS — BP 136/82 | HR 64 | Temp 98.6°F | Ht 68.0 in | Wt 202.0 lb

## 2017-10-31 DIAGNOSIS — I2510 Atherosclerotic heart disease of native coronary artery without angina pectoris: Secondary | ICD-10-CM | POA: Diagnosis not present

## 2017-10-31 DIAGNOSIS — E119 Type 2 diabetes mellitus without complications: Secondary | ICD-10-CM

## 2017-10-31 DIAGNOSIS — N320 Bladder-neck obstruction: Secondary | ICD-10-CM

## 2017-10-31 DIAGNOSIS — I10 Essential (primary) hypertension: Secondary | ICD-10-CM

## 2017-10-31 DIAGNOSIS — J019 Acute sinusitis, unspecified: Secondary | ICD-10-CM

## 2017-10-31 LAB — CBC WITH DIFFERENTIAL/PLATELET
Basophils Absolute: 0.1 10*3/uL (ref 0.0–0.1)
Basophils Relative: 0.7 % (ref 0.0–3.0)
EOS PCT: 1.3 % (ref 0.0–5.0)
Eosinophils Absolute: 0.1 10*3/uL (ref 0.0–0.7)
HCT: 42.1 % (ref 39.0–52.0)
Hemoglobin: 14.2 g/dL (ref 13.0–17.0)
LYMPHS ABS: 1.3 10*3/uL (ref 0.7–4.0)
Lymphocytes Relative: 16.2 % (ref 12.0–46.0)
MCHC: 33.7 g/dL (ref 30.0–36.0)
MCV: 89.6 fl (ref 78.0–100.0)
MONOS PCT: 10.1 % (ref 3.0–12.0)
Monocytes Absolute: 0.8 10*3/uL (ref 0.1–1.0)
NEUTROS PCT: 71.7 % (ref 43.0–77.0)
Neutro Abs: 6 10*3/uL (ref 1.4–7.7)
PLATELETS: 149 10*3/uL — AB (ref 150.0–400.0)
RBC: 4.69 Mil/uL (ref 4.22–5.81)
RDW: 14.4 % (ref 11.5–15.5)
WBC: 8.3 10*3/uL (ref 4.0–10.5)

## 2017-10-31 LAB — LIPID PANEL
CHOLESTEROL: 115 mg/dL (ref 0–200)
HDL: 37.5 mg/dL — ABNORMAL LOW (ref >39.00)
LDL CALC: 45 mg/dL (ref 0–99)
NonHDL: 77.76
TRIGLYCERIDES: 165 mg/dL — AB (ref 0.0–149.0)
Total CHOL/HDL Ratio: 3
VLDL: 33 mg/dL (ref 0.0–40.0)

## 2017-10-31 LAB — HEPATIC FUNCTION PANEL
ALBUMIN: 4.3 g/dL (ref 3.5–5.2)
ALT: 12 U/L (ref 0–53)
AST: 14 U/L (ref 0–37)
Alkaline Phosphatase: 56 U/L (ref 39–117)
Bilirubin, Direct: 0.2 mg/dL (ref 0.0–0.3)
Total Bilirubin: 0.9 mg/dL (ref 0.2–1.2)
Total Protein: 6.6 g/dL (ref 6.0–8.3)

## 2017-10-31 LAB — BASIC METABOLIC PANEL
BUN: 19 mg/dL (ref 6–23)
CALCIUM: 9.5 mg/dL (ref 8.4–10.5)
CHLORIDE: 104 meq/L (ref 96–112)
CO2: 26 meq/L (ref 19–32)
Creatinine, Ser: 0.84 mg/dL (ref 0.40–1.50)
GFR: 94.72 mL/min (ref >60.00)
Glucose, Bld: 107 mg/dL — ABNORMAL HIGH (ref 70–99)
POTASSIUM: 3.5 meq/L (ref 3.5–5.1)
SODIUM: 140 meq/L (ref 135–145)

## 2017-10-31 LAB — HEMOGLOBIN A1C: Hgb A1c MFr Bld: 5.6 % (ref 4.6–6.5)

## 2017-10-31 LAB — PSA: PSA: 1.22 ng/mL (ref 0.10–4.00)

## 2017-10-31 LAB — URINALYSIS, ROUTINE W REFLEX MICROSCOPIC
Bilirubin Urine: NEGATIVE
Hgb urine dipstick: NEGATIVE
KETONES UR: NEGATIVE
Leukocytes, UA: NEGATIVE
NITRITE: NEGATIVE
RBC / HPF: NONE SEEN (ref 0–?)
SPECIFIC GRAVITY, URINE: 1.025 (ref 1.000–1.030)
TOTAL PROTEIN, URINE-UPE24: NEGATIVE
URINE GLUCOSE: NEGATIVE
UROBILINOGEN UA: 0.2 (ref 0.0–1.0)
pH: 6 (ref 5.0–8.0)

## 2017-10-31 LAB — TSH: TSH: 2.24 u[IU]/mL (ref 0.35–4.50)

## 2017-10-31 LAB — MICROALBUMIN / CREATININE URINE RATIO
Creatinine,U: 176.7 mg/dL
MICROALB UR: 4.3 mg/dL — AB (ref 0.0–1.9)
Microalb Creat Ratio: 2.4 mg/g (ref 0.0–30.0)

## 2017-10-31 MED ORDER — AMOXICILLIN-POT CLAVULANATE 875-125 MG PO TABS: 1.0000 | ORAL_TABLET | Freq: Two times a day (BID) | ORAL | 0 refills | Status: DC

## 2017-10-31 NOTE — Patient Instructions (Addendum)
Please take all new medication as prescribed - the antibiotic  Please continue all other medications as before, and refills have been done if requested.  Please have the pharmacy call with any other refills you may need.  Please keep your appointments with your specialists as you may have planned   

## 2017-10-31 NOTE — Progress Notes (Signed)
Subjective:    Patient ID: Troy Banks, male    DOB: 12/19/1940, 77 y.o.   MRN: [id number]  HPI   Here with 2-3 days acute onset fever, facial pain, pressure, headache, general weakness and malaise, and greenish d/c, with mild ST and cough, but pt denies chest pain, wheezing, increased sob or doe, orthopnea, PND, increased LE swelling, palpitations, dizziness or syncope.  Pt denies new neurological symptoms such as new headache, or facial or extremity weakness or numbness   Pt denies polydipsia, polyuria, or low sugar symptoms such as weakness or confusion improved with po intake.  Pt states overall good compliance with meds, trying to follow lower cholesterol, diabetic diet, wt overall stable Wt Readings from Last 3 Encounters:  11/01/17 205 lb (93 kg)  10/31/17 202 lb (91.6 kg)  10/27/17 206 lb (93.4 kg)   Past Medical History:  Diagnosis Date  . Allergy   . Anxiety    PTSD  . Aortic stenosis, moderate 03/27/2015   post AVR echo Echo 11/16: Mild LVH, EF 55-60%, normal wall motion, grade 1 diastolic dysfunction, bioprosthetic AVR okay (mean gradient 18 mmHg) with trivial AI, mild LAE, atrial septal aneurysm, small effusion   . Arthritis    "qwhere" (10/29/2015)  . Carotid artery stenosis    Carotid US 11/17: 1-39% bilateral ICA stenosis. F/u prn  . Cervical stenosis of spinal canal    fusion 2006  . CHF (congestive heart failure) ([place])    "secondry to OHS"  . Closed left pilon fracture   . COLONIC POLYPS, HX OF   . Complication of anesthesia    "takes a lot to put him under and has woken up during surgery" Difficult to wake up . PTSD; "does not metabolize RX well"Pt. reports that he gets "violent", per pt.   . Diabetes mellitus without complication (H[facility])    diet controlled  . DIVERTICULOSIS, COLON   . Dysrhythmia    2 bouts of afib post op and at home but corrected with medication.  . Familial tremor 03/19/2014  . GERD (gastroesophageal reflux disease)    uses Zantac  .  GOUT   . Headache   . Heart murmur   . High cholesterol   . HYPERTENSION    off ACEI 2010 because of hyperkalemia in setting of ARI  . Hypothyroidism    newly diaganosed 09/2015  . Kidney stones    remote open stone-retrieval on L  . Myocardial infarction [facility])    October 20th 2016  . OSA on CPAP   . 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 PTSD (post-traumatic stress disorder)   . Sleep apnea    On a CPAP   Past Surgical History:  Procedure Laterality Date  . ANKLE FUSION Left 03/17/2017   Procedure: FUSION  PILON FRACTURE WITH BONE GRAFTING;  Surgeon: Shona Needles, MD;  Location: [facility];  Service: Orthopedics;  Laterality: Left;  . ANTERIOR FUSION CERVICAL SPINE  2002  . AORTIC VALVE REPLACEMENT N/A 03/30/2015   Procedure: AORTIC VALVE REPLACEMENT (AVR);  Surgeon: Grace Isaac, MD;  Location: [place];  Service: Open Heart Surgery;  Laterality: N/A;  . BACK SURGERY    . CARDIAC CATHETERIZATION N/A 03/26/2015   Procedure: Left Heart Cath and Coronary Angiography;  Surgeon: Leonie Man, MD;  Location: [facility];  Service: Cardiovascular;  Laterality: N/A;  . CARDIAC VALVE REPLACEMENT    .  CORONARY ARTERY BYPASS GRAFT N/A 03/30/2015   Procedure: CORONARY ARTERY BYPASS GRAFTING (CABG) x four, using left internal mammary artery and left    leg greater saphenous vein harvested endoscopically ;  Surgeon: Grace Isaac, MD;  Location: [place];  Service: Open Heart Surgery;  Laterality: N/A;  . CYSTOSCOPY/URETEROSCOPY/HOLMIUM LASER/STENT PLACEMENT Bilateral 11/07/2016   Procedure: CYSTOSCOPY/URETEROSCOPY/HOLMIUM LASER/STENT PLACEMENT;  Surgeon: Nickie Retort, MD;  Location: [facility];  Service: Urology;  Laterality: Bilateral;  . CYSTOSCOPY/URETEROSCOPY/HOLMIUM LASER/STENT PLACEMENT Bilateral 11/23/2016   Procedure: CYSTOSCOPY/BILATERAL URETEROSCOPY/HOLMIUM LASER/STENT EXCHANGE;  Surgeon: Nickie Retort, MD;  Location: [facility];  Service: Urology;  Laterality: Bilateral;  NEEDS 90 MIN   . EXTERNAL FIXATION LEG Left 02/22/2017   Procedure: EXTERNAL FIXATION LEFT ANKLE;  Surgeon: Renette Butters, MD;  Location: [facility];  Service: Orthopedics;  Laterality: Left;  . HEMORRHOID BANDING  1970s  . INGUINAL HERNIA REPAIR Left 1962  . JOINT REPLACEMENT    . [place]   "cut me open"  . POSTERIOR FUSION CERVICAL SPINE  2006  . TEE WITHOUT CARDIOVERSION N/A 03/30/2015   Procedure: TRANSESOPHAGEAL ECHOCARDIOGRAM (TEE);  Surgeon: Grace Isaac, MD;  Location: [facility];  Service: Open Heart Surgery;  Laterality: N/A;  . TESTICLE TORSION REDUCTION Right 1960  . TOE FUSION Left 1985 & 2004   "great toe"  . TONSILLECTOMY    . TOTAL HIP ARTHROPLASTY Right 10/27/2015   Procedure: RIGHT TOTAL HIP ARTHROPLASTY ANTERIOR APPROACH and steroid injection right foot;  Surgeon: Mcarthur Rossetti, MD;  Location: [facility];  Service: Orthopedics;  Laterality: Right;    reports that he quit smoking about 27 years ago. His smoking use included cigarettes. He has a 34.00 pack-year smoking history. He has never used smokeless tobacco. He reports that he drinks about 0.6 oz of alcohol per week. He reports that he does not use drugs. family history includes Alcohol abuse in his other; Arthritis in his other; Diabetes in his mother and paternal grandfather; Heart disease in his father, maternal grandfather, maternal grandmother, mother, and paternal grandfather; Hypertension in his father, maternal grandfather, maternal grandmother, mother, and paternal grandfather; Prostate cancer in his maternal uncle. Allergies  Allergen Reactions  . Benzodiazepines Other (See Comments)    Patient states he gets stroke symptoms with benzo's.   . Coreg [Carvedilol] Other (See Comments)    Accumulates and causes stroke-like side-effects due to 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 Patient poor metabolizer of CYP2D6 - Coreg  undergoes extensive hepatic (including 2D6)  . Lisinopril Other (See Comments)    Hyperkalemia. 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  . Mobic [Meloxicam] Other (See Comments)    Accumulates and causes stroke-like side-effects due to 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  . Sertraline Hcl Other (See Comments)    Accumulates and causes stroke-like side-effects due to 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  . Tramadol Other (See Comments)    Accumulates and causes stroke-like side-effects due to 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  . Atorvastatin Other (See Comments)    MYALGIAS  . Colchicine Other (See Comments)    ataxia  . Compazine [Prochlorperazine Maleate] Other (See Comments)    Accumulates and causes stroke-like side-effects due to 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  . Flomax [Tamsulosin Hcl]     Accumulates and causes stroke-like side-effects due to 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  . Ondansetron Other (See Comments)    Accumulates and causes stroke-like side-effects due to 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   .  Promethazine Other (See Comments)    Accumulates and causes stroke-like side-effects due to 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  . Acyclovir And Related Other (See Comments)    Accumulates and causes stroke-like side-effects due to 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   Current Outpatient Medications on File Prior to Visit  Medication Sig Dispense Refill  . acetaminophen (TYLENOL) 500 MG tablet Take 2 tablets (1,000 mg total) by mouth every 6 (six) hours. 90 tablet 2  . allopurinol (ZYLOPRIM) 300 MG tablet TAKE 1 TABLET BY MOUTH EVERY DAY 90 tablet 0  . amLODipine (NORVASC) 10 MG tablet TAKE 1 TABLET BY MOUTH EVERY DAY 90 tablet 0  . Ascorbic Acid (VITAMIN C) 1000 MG tablet Take 1,000 mg by mouth 2 (two) times daily. (0900 & 2100)    . aspirin 81 MG chewable tablet Chew 81 mg by mouth daily. (0900)    . Carboxymethylcellulose Sodium  (LUBRICANT EYE DROPS OP) Place 1 drop into both eyes at bedtime.    . cholecalciferol (VITAMIN D) 1000 units tablet Take 1,000 Units by mouth 2 (two) times daily. (0900 & 2100)    . furosemide (LASIX) 40 MG tablet TAKE 1 TABLET BY MOUTH EVERY OTHER DAY 45 tablet 3  . levothyroxine (SYNTHROID, LEVOTHROID) 25 MCG tablet TAKE 1 TABLET BY MOUTH EVERY DAY BEFORE BREAKFAST 90 tablet 2  . polyethylene glycol (MIRALAX / GLYCOLAX) packet Take 17 g by mouth daily. 14 each 0  . primidone (MYSOLINE) 50 MG tablet TAKE 3 TABLETS BY MOUTH IN THE MORNING AND TAKE 2 TABLETS BY MOUTH IN THE EVENING 450 tablet 1  . ranitidine (ZANTAC) 150 MG tablet TAKE 1 TABLET BY MOUTH 2 TIMES DAILY 180 tablet 1  . rosuvastatin (CRESTOR) 10 MG tablet TAKE 1/2 TABLET BY MOUTH DAILY 45 tablet 1  . tiZANidine (ZANAFLEX) 4 MG tablet Take 1 tablet (4 mg total) by mouth every 8 (eight) hours as needed for muscle spasms. 60 tablet 0  . Turmeric 500 MG CAPS Take 500 mg by mouth 2 (two) times daily. (0900 & 2100)    . vitamin B-12 (CYANOCOBALAMIN) 1000 MCG tablet Take 1,000 mcg by mouth 2 (two) times daily. (0900 & 2100)     No current facility-administered medications on file prior to visit.    Review of Systems  Constitutional: Negative for other unusual diaphoresis or sweats HENT: Negative for ear discharge or swelling Eyes: Negative for other worsening visual disturbances Respiratory: Negative for stridor or other swelling  Gastrointestinal: Negative for worsening distension or other blood Genitourinary: Negative for retention or other urinary change Musculoskeletal: Negative for other MSK pain or swelling Skin: Negative for color change or other new lesions Neurological: Negative for worsening tremors and other numbness  Psychiatric/Behavioral: Negative for worsening agitation or other fatigue All other system neg per pt    Objective:   Physical Exam BP 136/82   Pulse 64   Temp 98.6 F (37 C) (Oral)   Ht 5\' 8"  (1.727 m)    Wt 202 lb (91.6 kg)   SpO2 96%   BMI 30.71 kg/m  VS noted,  Constitutional: Pt appears in NAD HENT: Head: NCAT.  Right Ear: External ear normal.  Left Ear: External ear normal.  Eyes: . Pupils are equal, round, and reactive to light. Conjunctivae and EOM are normal Nose: without d/c or deformity Neck: Neck supple. Gross normal ROM Bilat tm's with mild erythema.  Max sinus areas mild tender.  Pharynx with mild erythema, no exudate Cardiovascular: Normal rate and regular  rhythm.   Pulmonary/Chest: Effort normal and breath sounds without rales or wheezing.  Neurological: Pt is alert. At baseline orientation, motor grossly intact Skin: Skin is warm. No rashes, other new lesions, no LE edema Psychiatric: Pt behavior is normal without agitation  No other exam findings    Assessment & Plan:

## 2017-11-01 ENCOUNTER — Ambulatory Visit (INDEPENDENT_AMBULATORY_CARE_PROVIDER_SITE_OTHER): Payer: Medicare Other | Admitting: Cardiology

## 2017-11-01 ENCOUNTER — Encounter: Payer: Self-pay | Admitting: Cardiology

## 2017-11-01 VITALS — BP 102/52 | HR 57 | Ht 68.0 in | Wt 205.0 lb

## 2017-11-01 DIAGNOSIS — Z952 Presence of prosthetic heart valve: Secondary | ICD-10-CM

## 2017-11-01 DIAGNOSIS — E785 Hyperlipidemia, unspecified: Secondary | ICD-10-CM

## 2017-11-01 DIAGNOSIS — I2510 Atherosclerotic heart disease of native coronary artery without angina pectoris: Secondary | ICD-10-CM

## 2017-11-01 DIAGNOSIS — I10 Essential (primary) hypertension: Secondary | ICD-10-CM

## 2017-11-01 DIAGNOSIS — Z951 Presence of aortocoronary bypass graft: Secondary | ICD-10-CM | POA: Diagnosis not present

## 2017-11-01 NOTE — Patient Instructions (Signed)
Continue your current therapy  I will see you in 6 months.   

## 2017-11-05 DIAGNOSIS — J019 Acute sinusitis, unspecified: Secondary | ICD-10-CM | POA: Insufficient documentation

## 2017-11-05 NOTE — Assessment & Plan Note (Signed)
Mild low today, asympt, stable overall by history and exam, recent data reviewed with pt, and pt to continue medical treatment as before,  to f/u any worsening symptoms or concerns BP Readings from Last 3 Encounters:  11/01/17 (!) 102/52  10/31/17 136/82  10/27/17 (!) 180/82

## 2017-11-05 NOTE — Assessment & Plan Note (Signed)
Mild to mod, for antibx course,  to f/u any worsening symptoms or concerns 

## 2017-11-05 NOTE — Assessment & Plan Note (Signed)
Lab Results  Component Value Date   HGBA1C 5.6 10/31/2017   stable overall by history and exam, recent data reviewed with pt, and pt to continue medical treatment as before,  to f/u any worsening symptoms or concerns

## 2017-11-07 ENCOUNTER — Telehealth: Payer: Self-pay | Admitting: Physical Therapy

## 2017-11-07 ENCOUNTER — Encounter: Payer: Self-pay | Admitting: Podiatry

## 2017-11-07 ENCOUNTER — Ambulatory Visit (INDEPENDENT_AMBULATORY_CARE_PROVIDER_SITE_OTHER): Payer: Medicare Other | Admitting: Podiatry

## 2017-11-07 DIAGNOSIS — M79674 Pain in right toe(s): Secondary | ICD-10-CM

## 2017-11-07 DIAGNOSIS — B351 Tinea unguium: Secondary | ICD-10-CM | POA: Diagnosis not present

## 2017-11-07 DIAGNOSIS — E119 Type 2 diabetes mellitus without complications: Secondary | ICD-10-CM

## 2017-11-07 DIAGNOSIS — M79675 Pain in left toe(s): Secondary | ICD-10-CM | POA: Diagnosis not present

## 2017-11-07 NOTE — Progress Notes (Signed)
Complaint:  Visit Type: Patient returns to my office for continued preventative foot care services. Complaint: Patient states" my nails have grown long and thick and become painful to walk and wear shoes" Patient has been diagnosed with DM with no foot complications.  He is diet controlled. The patient presents for preventative foot care services. No changes to ROS  Podiatric Exam: Vascular: dorsalis pedis and posterior tibial pulses are palpable right.  DP and PT are absent left foot due to swelling.. Capillary return is immediate. Temperature gradient is WNL. Skin turgor WNL  Sensorium: Normal Semmes Weinstein monofilament test. Normal tactile sensation bilaterally. Nail Exam: Pt has thick disfigured discolored nails with subungual debris noted bilateral entire nail hallux through fifth toenails Ulcer Exam: There is no evidence of ulcer or pre-ulcerative changes or infection. Orthopedic Exam: Muscle tone and strength are WNL. No limitations in general ROM. No crepitus or effusions noted. Foot type and digits show no abnormalities. Fusion 1st MPJ  Left. Skin: No Porokeratosis. No infection or ulcers  Diagnosis:  Onychomycosis, , Pain in right toe, pain in left toes  Treatment & Plan Procedures and Treatment: Consent by patient was obtained for treatment procedures.   Debridement of mycotic and hypertrophic toenails, 1 through 5 bilateral and clearing of subungual debris. No ulceration, no infection noted. ABN signed for 2019. Return Visit-Office Procedure: Patient instructed to return to the office for a follow up visit 10 weeks  for continued evaluation and treatment.     Troy Banks DPM 

## 2017-11-07 NOTE — Telephone Encounter (Signed)
Message left for contact in the future.

## 2017-11-08 ENCOUNTER — Telehealth: Payer: Self-pay | Admitting: Internal Medicine

## 2017-11-08 NOTE — Telephone Encounter (Signed)
Pts wife has been informed and expressed understanding. Will monitor and call back if no improvement.

## 2017-11-08 NOTE — Telephone Encounter (Signed)
It should be ok for now as long as no worsening fever, pain, swelling or f/c; I would hold on another antibiotic for now

## 2017-11-08 NOTE — Telephone Encounter (Signed)
Copied from [place] [id number]. Topic: Quick Communication - See Telephone Encounter >> Nov 08, 2017 10:26 AM Ether Griffins B wrote: CRM for notification. See Telephone encounter for: 11/08/17.  Pt calling in regards to the appt on 10/31/17 and the abscess on his right butt cheek. It has decreased in size but he states the core is still hard and he will be finished with the antibiotic tomorrow. He is wanting to know if Dr. Jenny Reichmann wants him to have another round of amoxicillin-clavulanate (AUGMENTIN) 875-125 MG tablet. If so please send to [place], [place], [place]

## 2017-11-27 ENCOUNTER — Ambulatory Visit (INDEPENDENT_AMBULATORY_CARE_PROVIDER_SITE_OTHER): Payer: Medicare Other | Admitting: Internal Medicine

## 2017-11-27 ENCOUNTER — Encounter: Payer: Self-pay | Admitting: Internal Medicine

## 2017-11-27 DIAGNOSIS — E119 Type 2 diabetes mellitus without complications: Secondary | ICD-10-CM

## 2017-11-27 DIAGNOSIS — L0231 Cutaneous abscess of buttock: Secondary | ICD-10-CM | POA: Diagnosis not present

## 2017-11-27 DIAGNOSIS — I2510 Atherosclerotic heart disease of native coronary artery without angina pectoris: Secondary | ICD-10-CM

## 2017-11-27 DIAGNOSIS — I10 Essential (primary) hypertension: Secondary | ICD-10-CM | POA: Diagnosis not present

## 2017-11-27 MED ORDER — DOXYCYCLINE HYCLATE 100 MG PO TABS: 100.0000 mg | ORAL_TABLET | Freq: Two times a day (BID) | ORAL | 0 refills | Status: DC

## 2017-11-27 NOTE — Patient Instructions (Signed)
Please take all new medication as prescribed  - the doxycycline  You will be contacted regarding the referral for: General Surgury (Dr Rush Farmer if possible) - so please call in a few days if you have not heard about this  Please continue all other medications as before, and refills have been done if requested.  Please have the pharmacy call with any other refills you may need.  Please continue your efforts at being more active, low cholesterol diet, and weight control.  Please keep your appointments with your specialists as you may have planned

## 2017-11-27 NOTE — Progress Notes (Signed)
Subjective:    Patient ID: Troy Banks, male    DOB: [birth year], 76 y.o.   MRN: [id number]  HPI   Here to f/u with c/o improved right  Buttock abscess but unfortuanately the site has seemed to hardened with a "core" and wife (nurse) thinks 2 sinus tracts now present and continue with minor serous drainage.  No fever or worsening pain, overall just not resolving.  Interestingly pt father had similar issue at a mature age requiring 75. Pt denies chest pain, increased sob or doe, wheezing, orthopnea, PND, increased LE swelling, palpitations, dizziness or syncope.   Pt denies polydipsia, polyuria,  Pt wife prefers Dr Rush Farmer if possible Past Medical History:  Diagnosis Date  . Allergy   . Anxiety    PTSD  . Aortic stenosis, moderate 03/27/2015   post AVR echo Echo 11/16: Mild LVH, EF 55-60%, normal wall motion, grade 1 diastolic dysfunction, bioprosthetic AVR okay (mean gradient 18 mmHg) with trivial AI, mild LAE, atrial septal aneurysm, small effusion   . Arthritis    "qwhere" (10/29/2015)  . Carotid artery stenosis    Carotid US 11/17: 1-39% bilateral ICA stenosis. F/u prn  . Cervical stenosis of spinal canal    fusion 2006  . CHF (congestive heart failure) ([place])    "secondry to OHS"  . Closed left pilon fracture   . COLONIC POLYPS, HX OF   . Complication of anesthesia    "takes a lot to put him under and has woken up during surgery" Difficult to wake up . PTSD; "does not metabolize RX well"Pt. reports that he gets "violent", per pt.   . Diabetes mellitus without complication (HCC)    diet controlled  . DIVERTICULOSIS, COLON   . Dysrhythmia    2 bouts of afib post op and at home but corrected with medication.  . Familial tremor 03/19/2014  . GERD (gastroesophageal reflux disease)    uses Zantac  . GOUT   . Headache   . Heart murmur   . High cholesterol   . HYPERTENSION    off ACEI 2010 because of hyperkalemia in setting of ARI  . Hypothyroidism    newly diaganosed  09/2015  . Kidney stones    remote open stone-retrieval on L  . Myocardial infarction [facility])    October 20th 2016  . OSA on CPAP   . 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 PTSD (post-traumatic stress disorder)   . Sleep apnea    On a CPAP   Past Surgical History:  Procedure Laterality Date  . ANKLE FUSION Left 03/17/2017   Procedure: FUSION  PILON FRACTURE WITH BONE GRAFTING;  Surgeon: Shona Needles, MD;  Location: [place];  Service: Orthopedics;  Laterality: Left;  . ANTERIOR FUSION CERVICAL SPINE  2002  . AORTIC VALVE REPLACEMENT N/A 03/30/2015   Procedure: AORTIC VALVE REPLACEMENT (AVR);  Surgeon: Grace Isaac, MD;  Location: [place];  Service: Open Heart Surgery;  Laterality: N/A;  . BACK SURGERY    . CARDIAC CATHETERIZATION N/A 03/26/2015   Procedure: Left Heart Cath and Coronary Angiography;  Surgeon: Leonie Man, MD;  Location: [place] [facility];  Service: Cardiovascular;  Laterality: N/A;  . CARDIAC VALVE REPLACEMENT    . CORONARY ARTERY BYPASS GRAFT N/A 03/30/2015   Procedure: CORONARY ARTERY BYPASS GRAFTING (CABG) x four, using left internal mammary artery and left    leg greater saphenous vein  harvested endoscopically ;  Surgeon: Grace Isaac, MD;  Location: [place];  Service: Open Heart Surgery;  Laterality: N/A;  . CYSTOSCOPY/URETEROSCOPY/HOLMIUM LASER/STENT PLACEMENT Bilateral 11/07/2016   Procedure: CYSTOSCOPY/URETEROSCOPY/HOLMIUM LASER/STENT PLACEMENT;  Surgeon: Nickie Retort, MD;  Location: [facility];  Service: Urology;  Laterality: Bilateral;  . CYSTOSCOPY/URETEROSCOPY/HOLMIUM LASER/STENT PLACEMENT Bilateral 11/23/2016   Procedure: CYSTOSCOPY/BILATERAL URETEROSCOPY/HOLMIUM LASER/STENT EXCHANGE;  Surgeon: Nickie Retort, MD;  Location: [facility];  Service: Urology;  Laterality: Bilateral;  NEEDS 90 MIN   . EXTERNAL FIXATION LEG Left 02/22/2017   Procedure: EXTERNAL FIXATION LEFT ANKLE;   Surgeon: Renette Butters, MD;  Location: [place];  Service: Orthopedics;  Laterality: Left;  . HEMORRHOID BANDING  1970s  . INGUINAL HERNIA REPAIR Left 1962  . JOINT REPLACEMENT    . [place]   "cut me open"  . POSTERIOR FUSION CERVICAL SPINE  2006  . TEE WITHOUT CARDIOVERSION N/A 03/30/2015   Procedure: TRANSESOPHAGEAL ECHOCARDIOGRAM (TEE);  Surgeon: Grace Isaac, MD;  Location: [place];  Service: Open Heart Surgery;  Laterality: N/A;  . TESTICLE TORSION REDUCTION Right 1960  . TOE FUSION Left 1985 & 2004   "great toe"  . TONSILLECTOMY    . TOTAL HIP ARTHROPLASTY Right 10/27/2015   Procedure: RIGHT TOTAL HIP ARTHROPLASTY ANTERIOR APPROACH and steroid injection right foot;  Surgeon: Mcarthur Rossetti, MD;  Location: [place];  Service: Orthopedics;  Laterality: Right;    reports that he quit smoking about 27 years ago. His smoking use included cigarettes. He has a 34.00 pack-year smoking history. He has never used smokeless tobacco. He reports that he drinks about 0.6 oz of alcohol per week. He reports that he does not use drugs. family history includes Alcohol abuse in his other; Arthritis in his other; Diabetes in his mother and paternal grandfather; Heart disease in his father, maternal grandfather, maternal grandmother, mother, and paternal grandfather; Hypertension in his father, maternal grandfather, maternal grandmother, mother, and paternal grandfather; Prostate cancer in his maternal uncle. Allergies  Allergen Reactions  . Benzodiazepines Other (See Comments)    Patient states he gets stroke symptoms with benzo's.   . Coreg [Carvedilol] Other (See Comments)    Accumulates and causes stroke-like side-effects due to 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 Patient poor metabolizer of CYP2D6 - Coreg undergoes extensive hepatic (including 2D6)  . Lisinopril Other (See Comments)    Hyperkalemia. Accumulates and causes stroke-like side-effects due to cytochrome P450  enzyme deficiency.  . Mobic [Meloxicam] Other (See Comments)    Accumulates and causes stroke-like side-effects due to 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  . Sertraline Hcl Other (See Comments)    Accumulates and causes stroke-like side-effects due to 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  . Tramadol Other (See Comments)    Accumulates and causes stroke-like side-effects due to 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  . Atorvastatin Other (See Comments)    MYALGIAS  . Colchicine Other (See Comments)    ataxia  . Compazine [Prochlorperazine Maleate] Other (See Comments)    Accumulates and causes stroke-like side-effects due to 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  . Flomax [Tamsulosin Hcl]     Accumulates and causes stroke-like side-effects due to 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  . Ondansetron Other (See Comments)    Accumulates and causes stroke-like side-effects due to 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   . Promethazine Other (See Comments)    Accumulates and causes stroke-like side-effects due to 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  . Acyclovir And Related Other (See Comments)  Accumulates and causes stroke-like side-effects due to 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   Current Outpatient Medications on File Prior to Visit  Medication Sig Dispense Refill  . acetaminophen (TYLENOL) 500 MG tablet Take 2 tablets (1,000 mg total) by mouth every 6 (six) hours. 90 tablet 2  . allopurinol (ZYLOPRIM) 300 MG tablet TAKE 1 TABLET BY MOUTH EVERY DAY 90 tablet 0  . amLODipine (NORVASC) 10 MG tablet TAKE 1 TABLET BY MOUTH EVERY DAY 90 tablet 0  . Ascorbic Acid (VITAMIN C) 1000 MG tablet Take 1,000 mg by mouth 2 (two) times daily. (0900 & 2100)    . aspirin 81 MG chewable tablet Chew 81 mg by mouth daily. (0900)    . Carboxymethylcellulose Sodium (LUBRICANT EYE DROPS OP) Place 1 drop into both eyes at bedtime.    . cholecalciferol (VITAMIN D) 1000 units tablet Take 1,000 Units by mouth 2 (two) times daily. (0900 &  2100)    . furosemide (LASIX) 40 MG tablet TAKE 1 TABLET BY MOUTH EVERY OTHER DAY 45 tablet 3  . levothyroxine (SYNTHROID, LEVOTHROID) 25 MCG tablet TAKE 1 TABLET BY MOUTH EVERY DAY BEFORE BREAKFAST 90 tablet 2  . polyethylene glycol (MIRALAX / GLYCOLAX) packet Take 17 g by mouth daily. 14 each 0  . primidone (MYSOLINE) 50 MG tablet TAKE 3 TABLETS BY MOUTH IN THE MORNING AND TAKE 2 TABLETS BY MOUTH IN THE EVENING 450 tablet 1  . ranitidine (ZANTAC) 150 MG tablet TAKE 1 TABLET BY MOUTH 2 TIMES DAILY 180 tablet 1  . rosuvastatin (CRESTOR) 10 MG tablet TAKE 1/2 TABLET BY MOUTH DAILY 45 tablet 1  . tiZANidine (ZANAFLEX) 4 MG tablet Take 1 tablet (4 mg total) by mouth every 8 (eight) hours as needed for muscle spasms. 60 tablet 0  . Turmeric 500 MG CAPS Take 500 mg by mouth 2 (two) times daily. (0900 & 2100)    . vitamin B-12 (CYANOCOBALAMIN) 1000 MCG tablet Take 1,000 mcg by mouth 2 (two) times daily. (0900 & 2100)     No current facility-administered medications on file prior to visit.    Review of Systems  Constitutional: Negative for other unusual diaphoresis or sweats HENT: Negative for ear discharge or swelling Eyes: Negative for other worsening visual disturbances Respiratory: Negative for stridor or other swelling  Gastrointestinal: Negative for worsening distension or other blood Genitourinary: Negative for retention or other urinary change Musculoskeletal: Negative for other MSK pain or swelling Skin: Negative for color change or other new lesions Neurological: Negative for worsening tremors and other numbness  Psychiatric/Behavioral: Negative for worsening agitation or other fatigue All other system neg per pt    Objective:   Physical Exam BP 140/88   Pulse 66   Temp 98.2 F (36.8 C) (Oral)   Ht 5\' 8"  (1.727 m)   Wt 203 lb (92.1 kg)   SpO2 96%   BMI 30.87 kg/m  VS noted,  Constitutional: Pt appears in NAD HENT: Head: NCAT.  Right Ear: External ear normal.  Left Ear:  External ear normal.  Eyes: . Pupils are equal, round, and reactive to light. Conjunctivae and EOM are normal Nose: without d/c or deformity Neck: Neck supple. Gross normal ROM Cardiovascular: Normal rate and regular rhythm.   Pulmonary/Chest: Effort normal and breath sounds without rales or wheezing.  Right mid buttock with 2 cm area induration, mild tender with 1 maybe 2 serous drainage sites mid lesion Neurological: Pt is alert. At baseline orientation, motor grossly intact Skin: Skin is warm.  No rashes, other new lesions, no LE edema Psychiatric: Pt behavior is normal without agitation  No other exam findings  Lab Results  Component Value Date   WBC 8.3 10/31/2017   HGB 14.2 10/31/2017   HCT 42.1 10/31/2017   PLT 149.0 (L) 10/31/2017   GLUCOSE 107 (H) 10/31/2017   CHOL 115 10/31/2017   TRIG 165.0 (H) 10/31/2017   HDL 37.50 (L) 10/31/2017   LDLDIRECT 100.0 09/08/2014   LDLCALC 45 10/31/2017   ALT 12 10/31/2017   AST 14 10/31/2017   NA 140 10/31/2017   K 3.5 10/31/2017   CL 104 10/31/2017   CREATININE 0.84 10/31/2017   BUN 19 10/31/2017   CO2 26 10/31/2017   TSH 2.24 10/31/2017   PSA 1.22 10/31/2017   INR 1.47 03/30/2015   HGBA1C 5.6 10/31/2017   MICROALBUR 4.3 (H) 10/31/2017        Assessment & Plan:

## 2017-11-27 NOTE — Assessment & Plan Note (Signed)
Mild to mod, for doxy course, but also refer general surgury,  to f/u any worsening symptoms or concerns

## 2017-11-27 NOTE — Assessment & Plan Note (Signed)
stable overall by history and exam, recent data reviewed with pt, and pt to continue medical treatment as before,  to f/u any worsening symptoms or concerns BP Readings from Last 3 Encounters:  11/27/17 140/88  11/01/17 (!) 102/52  10/31/17 136/82

## 2017-11-27 NOTE — Assessment & Plan Note (Signed)
stable overall by history and exam, recent data reviewed with pt, and pt to continue medical treatment as before,  to f/u any worsening symptoms or concerns  Lab Results  Component Value Date   HGBA1C 5.6 10/31/2017

## 2017-11-28 DIAGNOSIS — S82872D Displaced pilon fracture of left tibia, subsequent encounter for closed fracture with routine healing: Secondary | ICD-10-CM | POA: Diagnosis not present

## 2017-12-05 ENCOUNTER — Other Ambulatory Visit: Payer: Self-pay | Admitting: Neurology

## 2017-12-13 ENCOUNTER — Other Ambulatory Visit: Payer: Self-pay | Admitting: Surgery

## 2017-12-13 ENCOUNTER — Telehealth: Payer: Self-pay

## 2017-12-13 DIAGNOSIS — L723 Sebaceous cyst: Secondary | ICD-10-CM | POA: Diagnosis not present

## 2017-12-13 DIAGNOSIS — L089 Local infection of the skin and subcutaneous tissue, unspecified: Secondary | ICD-10-CM | POA: Diagnosis not present

## 2017-12-13 NOTE — Telephone Encounter (Signed)
He may hold ASA for 5 days for procedure.  Peter Jordan MD, FACC   

## 2017-12-13 NOTE — Telephone Encounter (Signed)
Dr. [name], this pt has hx of CABG and bioprosthetic AVR in 03/2015. Also past afib now off amiod with no recurrence. He is on aspirin 81 mg.   He is planned for excision of cyst. Can aspirin be ehld and for how long?  Please route response back to CV DIV PREOP  Thanks

## 2017-12-13 NOTE — Telephone Encounter (Signed)
   [facility] Pre-operative Risk Assessment    Request for surgical clearance:  1. What type of surgery is being performed? Excision of cyst    2. When is this surgery scheduled? TBD   3. What type of clearance is required (medical clearance vs. Pharmacy clearance to hold med vs. Both)? Both  4. Are there any medications that need to be held prior to surgery and how long? Aspirin   5. Practice name and name of physician performing surgery? [facility]    6. What is your office phone number [phone]   7.   What is your office fax number [phone]  8.   Anesthesia type (None, local, MAC, general) ? General    Ena Dawley 12/13/2017, 3:18 PM  _________________________________________________________________   (provider comments below)

## 2017-12-14 NOTE — Telephone Encounter (Signed)
Assuming the cyst is not dental should be ok without SBE

## 2017-12-15 NOTE — Telephone Encounter (Signed)
I spoke with the patient's wife and she says that the patient needs a sebacious cyst removed from his buttock. It had been infected and he has had 2 courses of antibiotics- Augmentin, then doxycycline and she says that it is not longer red and infected but it needs to be removed.  Pharmacy, what is your input on SBE proph.   Otherwise I have spoken to the patient and he is having no symptoms and can be cleared for the procedure.

## 2017-12-15 NOTE — Telephone Encounter (Signed)
Georgina Peer and Raquel have both responded - no need for SBE prophylaxis since this is not a dental or GI/GU procedure.

## 2017-12-15 NOTE — Telephone Encounter (Signed)
   Primary Cardiologist: [name], MD  Chart reviewed as part of pre-operative protocol coverage. Patient was contacted 12/15/2017 in reference to pre-operative risk assessment for pending surgery as outlined below.  Troy Banks was last seen on 11/01/17 by Dr. [name].  Since that day, Troy Banks has done well with no new cardiac complaints.  Therefore, based on ACC/AHA guidelines, the patient would be at acceptable risk for the planned procedure without further cardiovascular testing.   Per Dr. [name]: He may hold ASA for 5 days for procedure  Per our pharmacy protocol: Assuming the cyst is not dental should be ok without SBE  I will route this recommendation to the requesting party via Epic fax function and remove from pre-op pool.  Please call with questions.  Daune Perch, NP 12/15/2017, 11:27 AM

## 2018-01-11 ENCOUNTER — Encounter: Payer: Self-pay | Admitting: Internal Medicine

## 2018-01-15 ENCOUNTER — Other Ambulatory Visit: Payer: Self-pay | Admitting: Internal Medicine

## 2018-01-16 ENCOUNTER — Ambulatory Visit (INDEPENDENT_AMBULATORY_CARE_PROVIDER_SITE_OTHER): Payer: Medicare Other | Admitting: Podiatry

## 2018-01-16 ENCOUNTER — Encounter: Payer: Self-pay | Admitting: Podiatry

## 2018-01-16 DIAGNOSIS — B351 Tinea unguium: Secondary | ICD-10-CM | POA: Diagnosis not present

## 2018-01-16 DIAGNOSIS — E119 Type 2 diabetes mellitus without complications: Secondary | ICD-10-CM

## 2018-01-16 DIAGNOSIS — M79675 Pain in left toe(s): Secondary | ICD-10-CM

## 2018-01-16 DIAGNOSIS — M79674 Pain in right toe(s): Secondary | ICD-10-CM

## 2018-01-16 NOTE — Progress Notes (Signed)
Complaint:  Visit Type: Patient returns to my office for continued preventative foot care services. Complaint: Patient states" my nails have grown long and thick and become painful to walk and wear shoes" Patient has been diagnosed with DM with no foot complications.  He is diet controlled. The patient presents for preventative foot care services. No changes to ROS  Podiatric Exam: Vascular: dorsalis pedis and posterior tibial pulses are palpable right.  DP and PT are absent left foot due to swelling.. Capillary return is immediate. Temperature gradient is WNL. Skin turgor WNL  Sensorium: Normal Semmes Weinstein monofilament test. Normal tactile sensation bilaterally. Nail Exam: Pt has thick disfigured discolored nails with subungual debris noted bilateral entire nail hallux through fifth toenails Ulcer Exam: There is no evidence of ulcer or pre-ulcerative changes or infection. Orthopedic Exam: Muscle tone and strength are WNL. No limitations in general ROM. No crepitus or effusions noted. Foot type and digits show no abnormalities. Fusion 1st MPJ  Left. Skin: No Porokeratosis. No infection or ulcers  Diagnosis:  Onychomycosis, , Pain in right toe, pain in left toes  Treatment & Plan Procedures and Treatment: Consent by patient was obtained for treatment procedures.   Debridement of mycotic and hypertrophic toenails, 1 through 5 bilateral and clearing of subungual debris. No ulceration, no infection noted. ABN signed for 2019. Return Visit-Office Procedure: Patient instructed to return to the office for a follow up visit 10 weeks  for continued evaluation and treatment.     Gardiner Barefoot DPM

## 2018-01-18 ENCOUNTER — Encounter: Payer: Self-pay | Admitting: Gastroenterology

## 2018-01-18 NOTE — Progress Notes (Signed)
Subjective:   Troy Banks was seen in consultation in the movement disorder clinic at the request of [name], MD.  The evaluation is for tremor.  The records that were made available to me were reviewed.  Pt is accompanied by his significant other who supplements the hx.    The patient is a 77 y.o. left handed male with a history of tremor.  Pt reports tremor for at least 30 years.  Pt has noted increasing tremor over the years. Tremor is mostly in the right hand, but it is in the left as well and more rarely in the head as well.  Stress brings out the tremor.   He has never been to a neurologist for tremor.   Is currently on small amount of metoprolol - 25 mg - but that cannot be increased due to hx of bradycardia.  There is a family hx of tremor in his mother, maternal GM, maternal sister and maternal cousins.    Affected by caffeine:  No. (only drinks 2 cups coffee per week; drinks green tea) Affected by alcohol:  Used to decrease tremor but doesn't seem to any longer Affected by stress:  Yes.   Affected by fatigue:  Yes.   Spills soup if on spoon:  No. but has trouble with corn/peas Spills glass of liquid if full:  Yes.   Affects ADL's (tying shoes, brushing teeth, etc):  Yes.   (slow to tie shoes)  06/30/14 update:  Last visit, we started the patient on primidone for his tremor.  He cannot take Topamax because of history of nephrolithiasis or beta blockers because of bradycardia.  The patient reports that he is overall doing well.  For the first time in years, he was able to stand up and put on his jeans as he thinks that the medication helped his balance as well as tremor.  He has good and bad days in terms of tremor.  He has no SE in terms of tremor.  He states though that his metoprolol has been cut back since last visit because of bradycardia.    10/08/14 update:  Pt is on primidone '50mg'$  bid.  Pt states that initially it seemed helpful but nowl having tremor; had an episode  last week where couldn't shave because of tremor (not unusual to see tremor while shaving but almost unable to shave) and then whole body started shaking.  Lasted all day.  He had more word finding issues that day (has baseline word finding issues).  States that "I am concerned about something beyond familial tremor."  He is worried about PD.  Having cramping in the fingers and sometimes in the toes. Hands/fingers more than the feet.  He notices that his right arm "jerks", but thinks that that is due to "a botched neck surgery."  No significant issues with swallowing.  No diplopia.  10/30/14 update:  The patient is seen today, accompanied by his wife who supplements the history.  He had an MRI of the brain on 10/24/2014 that was unremarkable with exception of mild small vessel disease.  He had an MRI of the cervical spine demonstrating stenosis at several levels, but especially at the C6-C7 level and C3-C4 levels.  There was questionable thyroid enlargement and he was asked to follow up with his primary care physician in that regard.  On 10/28/2014 he underwent an EMG at our office.  This suggested chronic on active changes and intraspinal lesion at the right C6 level  and right S1 levels.  The etiology of this is unknown, but it could be anywhere from compressive etiologies to degeneration of the anterior horn cells.  He does not meet criteria for motor neuron disease at this point in time.  In regards to tremor, he has a few what he calls "episodes" in which his arms or legs will tremor more and may last 45 seconds.  BS was normal.  No known stress sets them off.  He may have a stuttering speech when it happens.    03/17/15 update:  The patient presents for follow-up today.  He is on primidone, 50 mg bid for essential tremor. Tremor has picked up but mostly with stress.  He thinks that we need to increase the primidone.   He is not a candidate for Topamax because of nephrolithiasis and is not a candidate for  beta blockers because of bradycardia.  We have worked him up extensively because of fasciculations.   No falls but "my balance is way off."   He had an MRI of the lumbar spine after we last met.  This was done on June 7.  There was evidence of severe facet arthrosis at the L4-L5 level and L5-S1 levels.  I told him that these findings do not explain all of his findings, including fasciculations.  He had an EEG on June 2 that was unremarkable.  This was done because of episodes of transient alteration of awareness that his wife had described.  His wife states that they didn't do the prolonged EEG because his insurance wouldn't allow it without a significant copay.  They are planning on r/s soon.  07/21/15 update:  The patient is seen today in follow-up, accompanied by his wife who supplements the history.  Much has happened since our last visit and I did review records.  Last visit, I increased his primidone to 100 mg in the morning and 50 mg at night.  He ended up being hospitalized in October, 2016 with an NSTEMI and ultimately required a coronary artery bypass graft and aortic valve replacement on 03/30/2015.  While in the hospital for this, he developed atrial fibrillation and was started on amiodarone.  Not surprisingly, this caused a significant increase in tremor.  He was rehospitalized in January, 2017 for acute gout and while he was in there he mentioned that tremor had increased.  His amiodarone was stopped.  He did have a repeat EMG since our last visit and this looked much better, without evidence of motor neuron disease.  He does feel that his balance has been a bit off.  Did not do cardiac rehab after CABG.    12/18/15 update:  The patient is seen today in follow-up.  He is accompanied by his wife who supplements the history.  I have reviewed prior records made available to me.  He remains on primidone, 100 mg in the morning and 50 mg at night. Been after amiodarone for 7 months and at the beginning of  May, he felt that tremor was much better but after hip surgery, felt that he regressed.  Surgery done under spinal anesthesia.  He had a total hip replacement on 10/27/2015.  Only did a week of therapy after that.  Felt that balance and tremor was worse after surgery.  Been losing weight through exercise.  Was put on synthroid 3 months ago.  04/19/16 update:  Pt f/u today accompanied by his wife who supplements the hx.  On primidone for ET,100 mg in  the AM/50 mg at night.  Has good days and bad days with tremor.  Reviewed Dr. Doug Sou notes.  Still off of amiodarone.  Is planning on selling home in spring.  Planning on moving to [place], [place].  Lost weight with lifestyle changes.  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  Mood much better now that back at work 5 days a week.  10/17/16 update:  Patient seen today in follow-up, accompanied by his wife who supplements the history.  The patient remains on primidone, 100 mg bid which he had increased since last visit.  He states that helped and "you can actually read my signature."  Biggest issue is low energy and he thinks that is related to his heart but he doesn't want to see the cardiologist.  He is too scared he would need another surgery.  Having MS chest pain over the scar.    Carotid ultrasound on 04/19/2016 was normal, with 1-39% stenosis bilaterally.  Little more imbalance.  Had to go on prednisone for gout.  Hasn't had A1C checked in a year.  Rarely checks BS at home - not checked in 3 weeks and "I know its high."    02/22/17 update:  Pt seen in f/u for ET.    The records that were made available to me were reviewed.  Had cystoscopy and lithotripsy since last visit.  After that tremor increased.  Called here and we increased his primidone to 150 in the AM and continued 100 mg at night.  He notes that stress makes it worse as does eating sugar.  States that he thinks that this is what he is taking but wife does his medication so he isn't  positive about that.  He is c/o loss of balance.  Is prediabetic.  Is not eating sugary foods.  Having subacute CP.  Worried about that.    07/25/17 update: Patient is seen today in follow-up for essential tremor.  I have reviewed records made available to me.  Patient is currently on 150 mg of primidone in the morning and 100 mg at night.  He reports that tremor picked up a bit after he was exposed to anesthesia.  Records have been reviewed since last visit.  Has been in the hospital a few times since our last visit.  Was in the hospital in September as he fell off a roof and landed on the left ankle.  He underwent external fixation on February 22, 2017 but could not have ankle fusion at the time because of significant swelling.  He was also found to have an acute L2 compression fracture.  He was discharged to a nursing facility for skilled rehab at [facility].  He liked therapy there but otherwise didn't care for the facility.  He returned to the hospital in October for fusion of the left ankle and external fixator pin site debridement.  Patient has been faithfully attending physical therapy.  He f/u 3/12 and he hopes to get rid of boot then.  01/22/18 update: Patient is seen today in follow-up for essential tremor.  He is on primidone 150 mg in the morning and 100 mg at night.  Wife states tremor is worse but pt states that it is intermittently worse, esp with fatigue/stress.  Walking/balance is better as he changed shoes.   He is still riding motorcycles.  Records are reviewed since last visit.  He did attend physical therapy for left ankle pain.  He has also been seen  for buttocks cyst which became infected.  His insurance denied surgery because it wasn't infected at the time and told him it was elective.  Pt states that "it will come back."  Current/Previously tried tremor medications: neurontin - was up to 300 mg bid for rotator cuff issues but never seemed to help tremor; back on it at low dose for hip  pain.    Outside reports reviewed: historical medical records and referral letter/letters.  Allergies  Allergen Reactions  . Benzodiazepines Other (See Comments)    Patient states he gets stroke symptoms with benzo's.   . Coreg [Carvedilol] Other (See Comments)    Accumulates and causes stroke-like side-effects due to 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 Patient poor metabolizer of CYP2D6 - Coreg undergoes extensive hepatic (including 2D6)  . Lisinopril Other (See Comments)    Hyperkalemia. 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  . Mobic [Meloxicam] Other (See Comments)    Accumulates and causes stroke-like side-effects due to 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  . Sertraline Hcl Other (See Comments)    Accumulates and causes stroke-like side-effects due to 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  . Tramadol Other (See Comments)    Accumulates and causes stroke-like side-effects due to 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  . Atorvastatin Other (See Comments)    MYALGIAS  . Colchicine Other (See Comments)    ataxia  . Compazine [Prochlorperazine Maleate] Other (See Comments)    Accumulates and causes stroke-like side-effects due to 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  . Flomax [Tamsulosin Hcl]     Accumulates and causes stroke-like side-effects due to 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  . Ondansetron Other (See Comments)    Accumulates and causes stroke-like side-effects due to 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   . Promethazine Other (See Comments)    Accumulates and causes stroke-like side-effects due to 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  . Acyclovir And Related Other (See Comments)    Accumulates and causes stroke-like side-effects due to 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    Outpatient Encounter Medications as of 01/22/2018  Medication Sig  . acetaminophen (TYLENOL) 500 MG tablet Take 2 tablets (1,000 mg total) by mouth every 6 (six) hours.  [name]  allopurinol (ZYLOPRIM) 300 MG tablet TAKE 1 TABLET BY MOUTH EVERY DAY  . amLODipine (NORVASC) 10 MG tablet TAKE 1 TABLET BY MOUTH EVERY DAY  . Ascorbic Acid (VITAMIN C) 1000 MG tablet Take 1,000 mg by mouth 2 (two) times daily. (0900 & 2100)  . aspirin 81 MG chewable tablet Chew 81 mg by mouth daily. (0900)  . Carboxymethylcellulose Sodium (LUBRICANT EYE DROPS OP) Place 1 drop into both eyes at bedtime.  . cholecalciferol (VITAMIN D) 1000 units tablet Take 1,000 Units by mouth 2 (two) times daily. (0900 & 2100)  . furosemide (LASIX) 40 MG tablet TAKE 1 TABLET BY MOUTH EVERY OTHER DAY  . levothyroxine (SYNTHROID, LEVOTHROID) 25 MCG tablet TAKE 1 TABLET BY MOUTH EVERY DAY BEFORE BREAKFAST  . polyethylene glycol (MIRALAX / GLYCOLAX) packet Take 17 g by mouth daily.  . primidone (MYSOLINE) 50 MG tablet TAKE 3 TABLETS BY MOUTH EVERY MORNING AND 2 TABLETS EVERY EVENING  . ranitidine (ZANTAC) 150 MG tablet TAKE 1 TABLET BY MOUTH 2 TIMES DAILY  . rosuvastatin (CRESTOR) 10 MG tablet TAKE 1/2 TABLET BY MOUTH DAILY  . tiZANidine (ZANAFLEX) 4 MG tablet Take 1 tablet (4 mg total) by mouth every 8 (eight) hours as needed for muscle spasms.  . Turmeric 500 MG CAPS Take 500 mg by mouth  2 (two) times daily. (0900 & 2100)  . vitamin B-12 (CYANOCOBALAMIN) 1000 MCG tablet Take 1,000 mcg by mouth 2 (two) times daily. (0900 & 2100)  . [DISCONTINUED] doxycycline (VIBRA-TABS) 100 MG tablet Take 1 tablet (100 mg total) by mouth 2 (two) times daily.  . [DISCONTINUED] primidone (MYSOLINE) 50 MG tablet TAKE 3 TABLETS BY MOUTH IN THE MORNING AND TAKE 2 TABLETS BY MOUTH IN THE EVENING   No facility-administered encounter medications on file as of 01/22/2018.     Past Medical History:  Diagnosis Date  . Allergy   . Anxiety    PTSD  . Aortic stenosis, moderate 03/27/2015   post AVR echo Echo 11/16: Mild LVH, EF 55-60%, normal wall motion, grade 1 diastolic dysfunction, bioprosthetic AVR okay (mean gradient 18 mmHg) with  trivial AI, mild LAE, atrial septal aneurysm, small effusion   . Arthritis    "qwhere" (10/29/2015)  . Carotid artery stenosis    Carotid US 11/17: 1-39% bilateral ICA stenosis. F/u prn  . Cervical stenosis of spinal canal    fusion 2006  . CHF (congestive heart failure) ([place])    "secondry to OHS"  . Closed left pilon fracture   . COLONIC POLYPS, HX OF   . Complication of anesthesia    "takes a lot to put him under and has woken up during surgery" Difficult to wake up . PTSD; "does not metabolize RX well"Pt. reports that he gets "violent", per pt.   . Diabetes mellitus without complication (HCC)    diet controlled  . DIVERTICULOSIS, COLON   . Dysrhythmia    2 bouts of afib post op and at home but corrected with medication.  . Familial tremor 03/19/2014  . GERD (gastroesophageal reflux disease)    uses Zantac  . GOUT   . Headache   . Heart murmur   . High cholesterol   . HYPERTENSION    off ACEI 2010 because of hyperkalemia in setting of ARI  . Hypothyroidism    newly diaganosed 09/2015  . Kidney stones    remote open stone-retrieval on L  . Myocardial infarction [facility])    October 20th 2016  . OSA on CPAP   . 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 PTSD (post-traumatic stress disorder)   . Sleep apnea    On a CPAP    Past Surgical History:  Procedure Laterality Date  . ANKLE FUSION Left 03/17/2017   Procedure: FUSION  PILON FRACTURE WITH BONE GRAFTING;  Surgeon: Shona Needles, MD;  Location: [facility];  Service: Orthopedics;  Laterality: Left;  . ANTERIOR FUSION CERVICAL SPINE  2002  . AORTIC VALVE REPLACEMENT N/A 03/30/2015   Procedure: AORTIC VALVE REPLACEMENT (AVR);  Surgeon: Grace Isaac, MD;  Location: [facility];  Service: Open Heart Surgery;  Laterality: N/A;  . BACK SURGERY    . CARDIAC CATHETERIZATION N/A 03/26/2015   Procedure: Left Heart Cath and Coronary Angiography;  Surgeon: Leonie Man,  MD;  Location: [facility];  Service: Cardiovascular;  Laterality: N/A;  . CARDIAC VALVE REPLACEMENT    . CORONARY ARTERY BYPASS GRAFT N/A 03/30/2015   Procedure: CORONARY ARTERY BYPASS GRAFTING (CABG) x four, using left internal mammary artery and left    leg greater saphenous vein harvested endoscopically ;  Surgeon: Grace Isaac, MD;  Location: [facility];  Service: Open Heart Surgery;  Laterality: N/A;  . CYSTOSCOPY/URETEROSCOPY/HOLMIUM LASER/STENT PLACEMENT Bilateral 11/07/2016  Procedure: CYSTOSCOPY/URETEROSCOPY/HOLMIUM LASER/STENT PLACEMENT;  Surgeon: Nickie Retort, MD;  Location: [facility];  Service: Urology;  Laterality: Bilateral;  . CYSTOSCOPY/URETEROSCOPY/HOLMIUM LASER/STENT PLACEMENT Bilateral 11/23/2016   Procedure: CYSTOSCOPY/BILATERAL URETEROSCOPY/HOLMIUM LASER/STENT EXCHANGE;  Surgeon: Nickie Retort, MD;  Location: [facility];  Service: Urology;  Laterality: Bilateral;  NEEDS 90 MIN   . EXTERNAL FIXATION LEG Left 02/22/2017   Procedure: EXTERNAL FIXATION LEFT ANKLE;  Surgeon: Renette Butters, MD;  Location: [facility];  Service: Orthopedics;  Laterality: Left;  . HEMORRHOID BANDING  1970s  . INGUINAL HERNIA REPAIR Left 1962  . JOINT REPLACEMENT    . [place]   "cut me open"  . POSTERIOR FUSION CERVICAL SPINE  2006  . TEE WITHOUT CARDIOVERSION N/A 03/30/2015   Procedure: TRANSESOPHAGEAL ECHOCARDIOGRAM (TEE);  Surgeon: Grace Isaac, MD;  Location: [facility];  Service: Open Heart Surgery;  Laterality: N/A;  . TESTICLE TORSION REDUCTION Right 1960  . TOE FUSION Left 1985 & 2004   "great toe"  . TONSILLECTOMY    . TOTAL HIP ARTHROPLASTY Right 10/27/2015   Procedure: RIGHT TOTAL HIP ARTHROPLASTY ANTERIOR APPROACH and steroid injection right foot;  Surgeon: Mcarthur Rossetti, MD;  Location: [facility];  Service: Orthopedics;  Laterality: Right;    Social History   Socioeconomic History  . Marital status: Single    Spouse name: Karmen Bongo, RN  .  Number of children: 2  . Years of education: Not on file  . Highest education level: Not on file  Occupational History  . Occupation: Retired Wellsite geologist  . Financial resource strain: Not on file  . Food insecurity:    Worry: Not on file    Inability: Not on file  . Transportation needs:    Medical: Not on file    Non-medical: Not on file  Tobacco Use  . Smoking status: Former Smoker    Packs/day: 1.00    Years: 34.00    Pack years: 34.00    Types: Cigarettes    Last attempt to quit: 06/06/1990    Years since quitting: 27.6  . Smokeless tobacco: Never Used  . Tobacco comment: widowed 1991 - lives with SO [name] who is RN  Substance and Sexual Activity  . Alcohol use: Yes    Alcohol/week: 1.0 standard drinks    Types: 1 Glasses of wine per week    Comment: occasional  . Drug use: No  . Sexual activity: Yes  Lifestyle  . Physical activity:    Days per week: Not on file    Minutes per session: Not on file  . Stress: Not on file  Relationships  . Social connections:    Talks on phone: Not on file    Gets together: Not on file    Attends religious service: Not on file    Active member of club or organization: Not on file    Attends meetings of clubs or organizations: Not on file    Relationship status: Not on file  . Intimate partner violence:    Fear of current or ex partner: Not on file    Emotionally abused: Not on file    Physically abused: Not on file    Forced sexual activity: Not on file  Other Topics Concern  . Not on file  Social History Narrative  . Not on file    Family Status  Relation Name Status  . Mother  Deceased       Heart  Disease, tremor  . Father  Deceased       Heart Disease  . Sister  Deceased       tremor  . Brother  (Not Specified)       unknown  . Brother  Alive       healthy  . Son  Alive       healthy  . Mat Uncle  (Not Specified)  . MGM  (Not Specified)  . MGF  (Not Specified)  . PGF  (Not Specified)    . Other  (Not Specified)  . Neg Hx  (Not Specified)    Review of Systems Review of Systems  Constitutional: Negative.   HENT: Negative.   Eyes: Negative.   Cardiovascular: Negative.   Genitourinary: Negative.   Skin: Negative.   Neurological: Positive for tremors.  Endo/Heme/Allergies: Negative.       Objective:   VITALS:   Vitals:   01/22/18 0802  BP: 140/80  Pulse: (!) 54  SpO2: 93%  Weight: 205 lb (93 kg)  Height: '5\' 8"'$  (1.727 m)   Wt Readings from Last 3 Encounters:  01/22/18 205 lb (93 kg)  11/27/17 203 lb (92.1 kg)  11/01/17 205 lb (93 kg)    GEN:  The patient appears stated age and is in NAD. HEENT:  Normocephalic, atraumatic.  The mucous membranes are moist. The superficial temporal arteries are without ropiness or tenderness. CV:  Loletha Grayer.  Regular.   Lungs:  CTAB Neck/HEME:  There are bilateral carotid bruits, L much more pronounced than the right.  Neurological examination:  Orientation: The patient is alert and oriented x3. Cranial nerves: There is good facial symmetry. The speech is fluent and clear. Soft palate rises symmetrically and there is no tongue deviation. Hearing is intact to conversational tone. Sensation: Sensation is intact to light touch throughout Motor: Strength is 5/5 in the bilateral upper and lower extremities.   Shoulder shrug is equal and symmetric.  There is no pronator drift.  MOVEMENT EXAM: Tremor:  There is intermittent head tremor.  There is intention tremor bilaterally.  There is mild to mod postural tremor.    Labs:    Chemistry      Component Value Date/Time   NA 140 10/31/2017 1528   K 3.5 10/31/2017 1528   CL 104 10/31/2017 1528   CO2 26 10/31/2017 1528   BUN 19 10/31/2017 1528   CREATININE 0.84 10/31/2017 1528   CREATININE 0.94 03/04/2016 0918      Component Value Date/Time   CALCIUM 9.5 10/31/2017 1528   ALKPHOS 56 10/31/2017 1528   AST 14 10/31/2017 1528   ALT 12 10/31/2017 1528   BILITOT 0.9 10/31/2017  1528     Lab Results  Component Value Date   HGBA1C 5.6 10/31/2017   Lab Results  Component Value Date   WBC 8.3 10/31/2017   HGB 14.2 10/31/2017   HCT 42.1 10/31/2017   MCV 89.6 10/31/2017   PLT 149.0 (L) 10/31/2017   Lab Results  Component Value Date   TSH 2.24 10/31/2017        Assessment/Plan:   1.  Essential Tremor.  -This is evidenced by the symmetrical nature and longstanding hx of gradually getting worse and family hx.  -he will continue primidone, 150 mg in the AM/100 mg at night.  Talked about pushing up dosage but both of [place] decided not to do that at this point.   Previously contacted pharmacy previously due to his hx of cytochrome P450  problems but told that no interaction and not an inducer of this system.  Had increased tremor previously with amiodarone.    Risks, benefits, side effects and alternative therapies were discussed.  The opportunity to ask questions was given and they were answered to the best of my ability.  The patient expressed understanding and willingness to follow the outlined treatment protocols.  -uninterested in DBS surgery.  Discussed focused ultrasound in detail  -Not a candidate for topamax due to current nephrolithiasis and hx of same.  Not able to increase beta blocker due to h/o bradycardia.  May be able to push up neurontin but would need to push up to much higher dose.  2.  Dizziness/fatigue/balance change  -recommended against riding motorcycle.  He feels that balance is good on the motorcycle.   3.  Fasciculations  -repeat EMG in 05/2015 much improved without evidence of MND  -The patient does have a compressive lesion in the cervical spine, but I'm not sure that that explains all of his symptoms.   4.  L carotid bruit  -Carotid ultrasound on 04/19/2016 was normal, with 1-39% stenosis bilaterally.  No further testing/follow up recommended by his PA, [employer] weaver.  Brought up this up again today.  They don't wish to proceed with further  eval  5.  Episodes of tremulousness, accompanied by potential speech change and ataxia according to his wife  - Routine EEG was negative and no further spells. 6. Follow up is anticipated in the next 6-8 months, sooner should new neurologic issues arise.

## 2018-01-22 ENCOUNTER — Encounter: Payer: Self-pay | Admitting: Neurology

## 2018-01-22 ENCOUNTER — Ambulatory Visit (INDEPENDENT_AMBULATORY_CARE_PROVIDER_SITE_OTHER): Payer: Medicare Other | Admitting: Neurology

## 2018-01-22 VITALS — BP 140/80 | HR 54 | Ht 68.0 in | Wt 205.0 lb

## 2018-01-22 DIAGNOSIS — I2510 Atherosclerotic heart disease of native coronary artery without angina pectoris: Secondary | ICD-10-CM | POA: Diagnosis not present

## 2018-01-22 DIAGNOSIS — R0989 Other specified symptoms and signs involving the circulatory and respiratory systems: Secondary | ICD-10-CM | POA: Diagnosis not present

## 2018-01-22 DIAGNOSIS — G250 Essential tremor: Secondary | ICD-10-CM

## 2018-02-12 ENCOUNTER — Other Ambulatory Visit: Payer: Self-pay | Admitting: Internal Medicine

## 2018-02-20 ENCOUNTER — Other Ambulatory Visit: Payer: Self-pay | Admitting: Internal Medicine

## 2018-02-28 ENCOUNTER — Ambulatory Visit (AMBULATORY_SURGERY_CENTER): Payer: Self-pay | Admitting: *Deleted

## 2018-02-28 ENCOUNTER — Other Ambulatory Visit: Payer: Self-pay

## 2018-02-28 VITALS — Ht 68.0 in | Wt 204.0 lb

## 2018-02-28 DIAGNOSIS — Z86010 Personal history of colonic polyps: Secondary | ICD-10-CM

## 2018-02-28 MED ORDER — PEG 3350-KCL-NA BICARB-NACL 420 G PO SOLR: 4000.0000 mL | Freq: Once | ORAL | 0 refills | Status: AC

## 2018-02-28 NOTE — Progress Notes (Signed)
No egg or soy allergy known to patient  No issues with past sedation with any surgeries  or procedures, no intubation problems  No diet pills per patient No home 02 use per patient  No blood thinners per patient  Pt denies issues with constipation  No A fib or A flutter  EMMI videot offered and declined by the patient Patient diagnosed with cyp450, multiple allergies related to this. Patient stating he has white coat syndrome.

## 2018-03-02 ENCOUNTER — Other Ambulatory Visit: Payer: Self-pay | Admitting: Internal Medicine

## 2018-03-13 ENCOUNTER — Ambulatory Visit (AMBULATORY_SURGERY_CENTER): Payer: Medicare Other | Admitting: Gastroenterology

## 2018-03-13 ENCOUNTER — Encounter: Payer: Self-pay | Admitting: Gastroenterology

## 2018-03-13 VITALS — BP 122/76 | HR 52 | Temp 98.4°F | Resp 16 | Ht 68.0 in | Wt 204.0 lb

## 2018-03-13 DIAGNOSIS — D123 Benign neoplasm of transverse colon: Secondary | ICD-10-CM

## 2018-03-13 DIAGNOSIS — Z86010 Personal history of colonic polyps: Secondary | ICD-10-CM

## 2018-03-13 DIAGNOSIS — Z1211 Encounter for screening for malignant neoplasm of colon: Secondary | ICD-10-CM | POA: Diagnosis not present

## 2018-03-13 MED ORDER — SODIUM CHLORIDE 0.9 % IV SOLN: 500.0000 mL | Freq: Once | INTRAVENOUS | Status: DC

## 2018-03-13 NOTE — Progress Notes (Signed)
Pt's states no medical or surgical changes since previsit or office visit. 

## 2018-03-13 NOTE — Op Note (Signed)
[place] Patient Name: Troy Banks Procedure Date: 03/13/2018 7:58 AM MRN: [id number] Endoscopist: [place]. Loletha Carrow , MD Age: 77 Referring MD:  Date of Birth: Jul 20, 1940 Gender: Male Account #: [id number] Procedure:                Colonoscopy Indications:              Surveillance: History of numerous (> 10) adenomas                            on last colonoscopy (< 3 yrs - 09/2016) Medicines:                Monitored Anesthesia Care Procedure:                Pre-Anesthesia Assessment:                           - Prior to the procedure, a History and Physical                            was performed, and patient medications and                            allergies were reviewed. The patient's tolerance of                            previous anesthesia was also reviewed. The risks                            and benefits of the procedure and the sedation                            options and risks were discussed with the patient.                            All questions were answered, and informed consent                            was obtained. Anticoagulants: The patient has taken                            aspirin. It was decided not to withhold this                            medication prior to the procedure. ASA Grade                            Assessment: III - A patient with severe systemic                            disease. After reviewing the risks and benefits,                            the patient was deemed in satisfactory condition to  undergo the procedure.                           After obtaining informed consent, the colonoscope                            was passed under direct vision. Throughout the                            procedure, the patient's blood pressure, pulse, and                            oxygen saturations were monitored continuously. The                            Colonoscope was introduced through the anus  and                            advanced to the the cecum, identified by                            appendiceal orifice and ileocecal valve. The                            colonoscopy was performed without difficulty. The                            patient tolerated the procedure well. The quality                            of the bowel preparation was good. The ileocecal                            valve, appendiceal orifice, and rectum were                            photographed. , after lavage The bowel preparation                            used was GoLYTELY. Scope In: 8:08:13 AM Scope Out: 8:26:14 AM Scope Withdrawal Time: 0 hours 12 minutes 38 seconds  Total Procedure Duration: 0 hours 18 minutes 1 second  Findings:                 The perianal and digital rectal examinations were                            normal.                           Many diverticula were found in the entire colon.                           A 2 mm polyp was found in the transverse colon. The  polyp was sessile. The polyp was removed with a                            cold biopsy forceps. Resection and retrieval were                            complete.                           Two sessile polyps were found in the mid transverse                            colon. The polyps were 4 mm in size. These polyps                            were removed with a cold snare. Resection and                            retrieval were complete.                           The exam was otherwise without abnormality on                            direct and retroflexion views. Complications:            No immediate complications. Estimated Blood Loss:     Estimated blood loss was minimal. Impression:               - Diverticulosis in the entire examined colon.                           - One 2 mm polyp in the transverse colon, removed                            with a cold biopsy forceps. Resected and  retrieved.                           - Two 4 mm polyps in the mid transverse colon,                            removed with a cold snare. Resected and retrieved.                           - The examination was otherwise normal on direct                            and retroflexion views. Recommendation:           - Patient has a contact number available for                            emergencies. The signs and symptoms of potential  delayed complications were discussed with the                            patient. Return to normal activities tomorrow.                            Written discharge instructions were provided to the                            patient.                           - Resume previous diet.                           - Continue present medications.                           - Repeat colonoscopy in 3 years for surveillance. Henry L. Loletha Carrow, MD 03/13/2018 8:33:27 AM This report has been signed electronically.

## 2018-03-13 NOTE — Patient Instructions (Addendum)
Please read handout on Polyps and Diverticulosis. Continue present medications.     YOU HAD AN ENDOSCOPIC PROCEDURE TODAY AT [facility]:   Refer to the procedure report that was given to you for any specific questions about what was found during the examination.  If the procedure report does not answer your questions, please call your gastroenterologist to clarify.  If you requested that your care partner not be given the details of your procedure findings, then the procedure report has been included in a sealed envelope for you to review at your convenience later.  YOU SHOULD EXPECT: Some feelings of bloating in the abdomen. Passage of more gas than usual.  Walking can help get rid of the air that was put into your GI tract during the procedure and reduce the bloating. If you had a lower endoscopy (such as a colonoscopy or flexible sigmoidoscopy) you may notice spotting of blood in your stool or on the toilet paper. If you underwent a bowel prep for your procedure, you may not have a normal bowel movement for a few days.  Please Note:  You might notice some irritation and congestion in your nose or some drainage.  This is from the oxygen used during your procedure.  There is no need for concern and it should clear up in a day or so.  SYMPTOMS TO REPORT IMMEDIATELY:   Following lower endoscopy (colonoscopy or flexible sigmoidoscopy):  Excessive amounts of blood in the stool  Significant tenderness or worsening of abdominal pains  Swelling of the abdomen that is new, acute  Fever of 100F or higher   For urgent or emergent issues, a gastroenterologist can be reached at any hour by calling [phone].   DIET:  We do recommend a small meal at first, but then you may proceed to your regular diet.  Drink plenty of fluids but you should avoid alcoholic beverages for 24 hours.  ACTIVITY:  You should plan to take it easy for the rest of today and you should NOT DRIVE or use  heavy machinery until tomorrow (because of the sedation medicines used during the test).    FOLLOW UP: Our staff will call the number listed on your records the next business day following your procedure to check on you and address any questions or concerns that you may have regarding the information given to you following your procedure. If we do not reach you, we will leave a message.  However, if you are feeling well and you are not experiencing any problems, there is no need to return our call.  We will assume that you have returned to your regular daily activities without incident.  If any biopsies were taken you will be contacted by phone or by letter within the next 1-3 weeks.  Please call us at [phone] if you have not heard about the biopsies in 3 weeks.    SIGNATURES/CONFIDENTIALITY: You and/or your care partner have signed paperwork which will be entered into your electronic medical record.  These signatures attest to the fact that that the information above on your After Visit Summary has been reviewed and is understood.  Full responsibility of the confidentiality of this discharge information lies with you and/or your care-partner.

## 2018-03-13 NOTE — Progress Notes (Signed)
A/ox3 pleased with MAC, report to RN 

## 2018-03-13 NOTE — Progress Notes (Signed)
Called to room to assist during endoscopic procedure.  Patient ID and intended procedure confirmed with present staff. Received instructions for my participation in the procedure from the performing physician.  

## 2018-03-14 ENCOUNTER — Telehealth: Payer: Self-pay | Admitting: *Deleted

## 2018-03-14 ENCOUNTER — Telehealth: Payer: Self-pay

## 2018-03-14 NOTE — Telephone Encounter (Signed)
  Follow up Call-  Call back number 03/13/2018 09/16/2016 09/16/2016  Post procedure Call Back phone  # [phone] - [phone]  Permission to leave phone message Yes Yes Yes  Some recent data might be hidden     Patient questions:  Do you have a fever, pain , or abdominal swelling? No. Pain Score  0 *  Have you tolerated food without any problems? Yes.    Have you been able to return to your normal activities? Yes.    Do you have any questions about your discharge instructions: Diet   No. Medications  No. Follow up visit  No.  Do you have questions or concerns about your Care? No.  Actions: * If pain score is 4 or above: No action needed, pain <4.

## 2018-03-14 NOTE — Telephone Encounter (Signed)
  Follow up Call-  Call back number 03/13/2018 09/16/2016 09/16/2016  Post procedure Call Back phone  # [phone] - [phone]  Permission to leave phone message Yes Yes Yes  Some recent data might be hidden     Patient questions:  Message left to call us if necessary.

## 2018-03-15 ENCOUNTER — Other Ambulatory Visit: Payer: Self-pay | Admitting: Cardiology

## 2018-03-16 ENCOUNTER — Encounter: Payer: Self-pay | Admitting: Gastroenterology

## 2018-03-28 ENCOUNTER — Encounter: Payer: Self-pay | Admitting: Podiatry

## 2018-03-28 ENCOUNTER — Ambulatory Visit (INDEPENDENT_AMBULATORY_CARE_PROVIDER_SITE_OTHER): Payer: Medicare Other | Admitting: Podiatry

## 2018-03-28 DIAGNOSIS — M79675 Pain in left toe(s): Secondary | ICD-10-CM

## 2018-03-28 DIAGNOSIS — M79674 Pain in right toe(s): Secondary | ICD-10-CM | POA: Diagnosis not present

## 2018-03-28 DIAGNOSIS — E119 Type 2 diabetes mellitus without complications: Secondary | ICD-10-CM | POA: Diagnosis not present

## 2018-03-28 DIAGNOSIS — B351 Tinea unguium: Secondary | ICD-10-CM

## 2018-03-28 NOTE — Progress Notes (Signed)
Complaint:  Visit Type: Patient returns to my office for continued preventative foot care services. Complaint: Patient states" my nails have grown long and thick and become painful to walk and wear shoes" Patient has been diagnosed with DM with no foot complications.  He is diet controlled. The patient presents for preventative foot care services. No changes to ROS  Podiatric Exam: Vascular: dorsalis pedis and posterior tibial pulses are palpable right.  DP and PT are absent left foot due to swelling.. Capillary return is immediate. Temperature gradient is WNL. Skin turgor WNL  Sensorium: Normal Semmes Weinstein monofilament test. Normal tactile sensation bilaterally. Nail Exam: Pt has thick disfigured discolored nails with subungual debris noted bilateral entire nail hallux through fifth toenails Ulcer Exam: There is no evidence of ulcer or pre-ulcerative changes or infection. Orthopedic Exam: Muscle tone and strength are WNL. No limitations in general ROM. No crepitus or effusions noted. Foot type and digits show no abnormalities. Fusion 1st MPJ  Left. Skin: No Porokeratosis. No infection or ulcers  Diagnosis:  Onychomycosis, , Pain in right toe, pain in left toes  Treatment & Plan Procedures and Treatment: Consent by patient was obtained for treatment procedures.   Debridement of mycotic and hypertrophic toenails, 1 through 5 bilateral and clearing of subungual debris. No ulceration, no infection noted. ABN signed for 2019. Return Visit-Office Procedure: Patient instructed to return to the office for a follow up visit 10 weeks  for continued evaluation and treatment.     Gardiner Barefoot DPM

## 2018-04-16 ENCOUNTER — Other Ambulatory Visit: Payer: Self-pay | Admitting: Cardiology

## 2018-05-18 ENCOUNTER — Other Ambulatory Visit: Payer: Self-pay | Admitting: Internal Medicine

## 2018-05-23 ENCOUNTER — Other Ambulatory Visit: Payer: Self-pay | Admitting: Internal Medicine

## 2018-06-01 ENCOUNTER — Other Ambulatory Visit: Payer: Self-pay | Admitting: Neurology

## 2018-06-08 ENCOUNTER — Ambulatory Visit: Payer: Medicare Other | Admitting: Podiatry

## 2018-06-13 ENCOUNTER — Encounter: Payer: Self-pay | Admitting: Podiatry

## 2018-06-13 ENCOUNTER — Ambulatory Visit: Payer: Medicare Other | Admitting: Podiatry

## 2018-06-13 DIAGNOSIS — E119 Type 2 diabetes mellitus without complications: Secondary | ICD-10-CM | POA: Diagnosis not present

## 2018-06-13 DIAGNOSIS — M79675 Pain in left toe(s): Secondary | ICD-10-CM

## 2018-06-13 DIAGNOSIS — B351 Tinea unguium: Secondary | ICD-10-CM | POA: Diagnosis not present

## 2018-06-13 DIAGNOSIS — M79674 Pain in right toe(s): Secondary | ICD-10-CM | POA: Diagnosis not present

## 2018-06-13 NOTE — Progress Notes (Addendum)
Complaint:  Visit Type: Patient returns to my office for continued preventative foot care services. Complaint: Patient states" my nails have grown long and thick and become painful to walk and wear shoes" Patient has been diagnosed with DM with no foot complications.  He is diet controlled. The patient presents for preventative foot care services. No changes to ROS  Podiatric Exam: Vascular: dorsalis pedis and posterior tibial pulses are palpable right.  DP and PT are absent left foot due to swelling.. Capillary return is immediate. Temperature gradient is WNL. Skin turgor WNL  Sensorium: Normal Semmes Weinstein monofilament test. Normal tactile sensation bilaterally. Nail Exam: Pt has thick disfigured discolored nails with subungual debris noted bilateral entire nail hallux through fifth toenails Ulcer Exam: There is no evidence of ulcer or pre-ulcerative changes or infection. Orthopedic Exam: Muscle tone and strength are WNL. No limitations in general ROM. No crepitus or effusions noted. Foot type and digits show no abnormalities. Fusion 1st MPJ  Left. Skin: No Porokeratosis. No infection or ulcers  Diagnosis:  Onychomycosis, , Pain in right toe, pain in left toes  Treatment & Plan Procedures and Treatment: Consent by patient was obtained for treatment procedures.   Debridement of mycotic and hypertrophic toenails, 1 through 5 bilateral and clearing of subungual debris. No ulceration, no infection noted. ABN signed for 2020. Return Visit-Office Procedure: Patient instructed to return to the office for a follow up visit 10 weeks  for continued evaluation and treatment.     Gardiner Barefoot DPM

## 2018-06-14 ENCOUNTER — Other Ambulatory Visit: Payer: Self-pay | Admitting: *Deleted

## 2018-06-14 MED ORDER — ROSUVASTATIN CALCIUM 10 MG PO TABS: 5.0000 mg | ORAL_TABLET | Freq: Every day | ORAL | 1 refills | Status: DC

## 2018-06-16 NOTE — Progress Notes (Signed)
06/18/2018 Troy Banks   1941/04/14  [id number]  Primary Physician Troy Borg, MD Primary Cardiologist: Dr Troy Banks  HPI:  78 y.o. Male seen for follow up CAD. He was admitted 10/19-10/31/16 with a non-STEMI. LHC demonstrated 3 vessel CAD. He also had significant aortic stenosis.  He underwent CABG with LIMA-LAD, SVG-intermediate, SVG-LCx, SVG-distal RCA and bioprosthetic AVR. Atenolol was initiated but later stopped secondary to bradycardia. He did develop atrial fibrillation. He was placed on amiodarone with return to NSR. He has maintained NSR and has recovered well. He has an essential tremor and is on Primidone chronically. 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 Amiodarone was stopped in January 2017 at the time he was hospitalized for an acute gout attack in his [place]. He has maintained NSR.  His wife keeps close check on him. She is a retired Quarry manager. He has a daughter who is a family physician in [place].  In May 2018 he complained of more dyspnea on exertion and some chest tightness.  CXR was clear. Myoview study was normal.  In June 2018 he underwent cystoscopy and lithotripsy for renal stones.   In September 2018 he fell off his roof fracturing his left ankle and had a compression fracture of L2. He required extensive surgery of his ankle.   On follow up today he is doing well. He does have white coat syndrome. Usually BP at home is 354-656 systolic with diastolic of 75. He is planning on getting back in the gym. He denies any chest pain or dyspnea. No palpitations.   Current Outpatient Medications  Medication Sig Dispense Refill  . acetaminophen (TYLENOL) 500 MG tablet Take 2 tablets (1,000 mg total) by mouth every 6 (six) hours. 90 tablet 2  . allopurinol (ZYLOPRIM) 300 MG tablet TAKE 1 TABLET BY MOUTH EVERY DAY 90 tablet 1  . amLODipine (NORVASC) 10 MG tablet TAKE 1 TABLET BY MOUTH  EVERY DAY 90 tablet 0  . Ascorbic Acid (VITAMIN C) 1000 MG tablet Take 1,000 mg by mouth 2 (two) times daily. (0900 & 2100)    . aspirin 81 MG chewable tablet Chew 81 mg by mouth daily. (0900)    . Carboxymethylcellulose Sodium (LUBRICANT EYE DROPS OP) Place 1 drop into both eyes at bedtime.    . cholecalciferol (VITAMIN D) 1000 units tablet Take 1,000 Units by mouth 2 (two) times daily. (0900 & 2100)    . furosemide (LASIX) 40 MG tablet TAKE 1 TABLET BY MOUTH EVERY OTHER DAY 30 tablet 0  . levothyroxine (SYNTHROID, LEVOTHROID) 25 MCG tablet TAKE 1 TABLET BY MOUTH EVERY DAY BEFORE BREAKFAST 90 tablet 2  . polyethylene glycol (MIRALAX / GLYCOLAX) packet Take 17 g by mouth daily. 14 each 0  . primidone (MYSOLINE) 50 MG tablet TAKE 3 TABLETS BY MOUTH EVERY MORNING AND 2 TABLETS EVERY EVENING 450 tablet 1  . ranitidine (ZANTAC) 150 MG tablet TAKE 1 TABLET BY MOUTH 2 TIMES DAILY 180 tablet 1  . rosuvastatin (CRESTOR) 10 MG tablet Take 0.5 tablets (5 mg total) by mouth daily. 45 tablet 1  . tiZANidine (ZANAFLEX) 4 MG tablet Take 1 tablet (4 mg total) by mouth every 8 (eight) hours as needed for muscle spasms. 60 tablet 0  . Turmeric 500 MG CAPS Take 500 mg by mouth 2 (two) times daily. (0900 & 2100)    . vitamin B-12 (CYANOCOBALAMIN) 1000 MCG tablet Take 1,000 mcg by  mouth 2 (two) times daily. (0900 & 2100)     No current facility-administered medications for this visit.     Allergies  Allergen Reactions  . Benzodiazepines Other (See Comments)    Patient states he gets stroke symptoms with benzo's.   . Coreg [Carvedilol] Other (See Comments)    Accumulates and causes stroke-like side-effects due to 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 Patient poor metabolizer of CYP2D6 - Coreg undergoes extensive hepatic (including 2D6)  . Lisinopril Other (See Comments)    Hyperkalemia. 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  . Mobic [Meloxicam] Other (See  Comments)    Accumulates and causes stroke-like side-effects due to 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  . Sertraline Hcl Other (See Comments)    Accumulates and causes stroke-like side-effects due to 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  . Tramadol Other (See Comments)    Accumulates and causes stroke-like side-effects due to 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  . Atorvastatin Other (See Comments)    MYALGIAS  . Colchicine Other (See Comments)    ataxia  . Compazine [Prochlorperazine Maleate] Other (See Comments)    Accumulates and causes stroke-like side-effects due to 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  . Flomax [Tamsulosin Hcl]     Accumulates and causes stroke-like side-effects due to 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  . Ondansetron Other (See Comments)    Accumulates and causes stroke-like side-effects due to 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   . Promethazine Other (See Comments)    Accumulates and causes stroke-like side-effects due to 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  . Acyclovir And Related Other (See Comments)    Accumulates and causes stroke-like side-effects due to 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    Social History   Socioeconomic History  . Marital status: Single    Spouse name: Troy Bongo, RN  . Number of children: 2  . Years of education: Not on file  . Highest education level: Not on file  Occupational History  . Occupation: Retired Wellsite geologist  . Financial resource strain: Not on file  . Food insecurity:    Worry: Not on file    Inability: Not on file  . Transportation needs:    Medical: Not on file    Non-medical: Not on file  Tobacco Use  . Smoking status: Former Smoker    Packs/day: 1.00    Years: 34.00    Pack years: 34.00    Types: Cigarettes    Last attempt to quit: 06/06/1990    Years since quitting: 28.0  . Smokeless tobacco: Never Used  . Tobacco comment: widowed 1991 - lives with SO Troy Banks who is RN  Substance and Sexual  Activity  . Alcohol use: Yes    Alcohol/week: 1.0 standard drinks    Types: 1 Glasses of wine per week    Comment: occasional  . Drug use: No  . Sexual activity: Yes  Lifestyle  . Physical activity:    Days per week: Not on file    Minutes per session: Not on file  . Stress: Not on file  Relationships  . Social connections:    Talks on phone: Not on file    Gets together: Not on file    Attends religious service: Not on file    Active member of club or organization: Not on file    Attends meetings of clubs or organizations: Not on file    Relationship status: Not on file  . Intimate partner violence:    Fear of  current or ex partner: Not on file    Emotionally abused: Not on file    Physically abused: Not on file    Forced sexual activity: Not on file  Other Topics Concern  . Not on file  Social History Narrative  . Not on file     Review of Systems: As noted in HPI All other systems reviewed and are otherwise negative except as noted above.    Blood pressure (!) 160/80, pulse (!) 55, height 5\' 8"  (1.727 m), weight 207 lb (93.9 kg).  GENERAL:  Well appearing WM in NAD HEENT:  PERRL, EOMI, sclera are clear. Oropharynx is clear. NECK:  No jugular venous distention, carotid upstroke brisk and symmetric, no bruits, no thyromegaly or adenopathy LUNGS:  Clear to auscultation bilaterally CHEST:  Unremarkable, old sternotomy scar HEART:  RRR,  PMI not displaced or sustained,S1 and S2 within normal limits, no S3, no S4: no clicks, no rubs, gr 2/6 systolic murmur RUSB.  ABD:  Soft, nontender. BS +, no masses or bruits. No hepatomegaly, no splenomegaly EXT:  2 + pulses throughout, Tr edema on left- left calf is larger than right., no cyanosis no clubbing SKIN:  Warm and dry.  No rashes NEURO:  Alert and oriented x 3. Cranial nerves II through XII intact. PSYCH:  Cognitively intact    Lab Results  Component Value Date   WBC 8.3 10/31/2017   HGB 14.2 10/31/2017   HCT 42.1  10/31/2017   PLT 149.0 (L) 10/31/2017   GLUCOSE 107 (H) 10/31/2017   CHOL 115 10/31/2017   TRIG 165.0 (H) 10/31/2017   HDL 37.50 (L) 10/31/2017   LDLDIRECT 100.0 09/08/2014   LDLCALC 45 10/31/2017   ALT 12 10/31/2017   AST 14 10/31/2017   NA 140 10/31/2017   K 3.5 10/31/2017   CL 104 10/31/2017   CREATININE 0.84 10/31/2017   BUN 19 10/31/2017   CO2 26 10/31/2017   TSH 2.24 10/31/2017   PSA 1.22 10/31/2017   INR 1.47 03/30/2015   HGBA1C 5.6 10/31/2017   MICROALBUR 4.3 (H) 10/31/2017   Ecg today shows sinus brady with first degree AV block. Nonspecific ST T wave abnormality. I have personally reviewed and interpreted this study.  Echo 04/24/15: Study Conclusions  - Left ventricle: The cavity size was normal. Wall thickness was   increased in a pattern of mild LVH. Systolic function was normal.   The estimated ejection fraction was in the range of 55% to 60%.   Wall motion was normal; there were no regional wall motion   abnormalities. Doppler parameters are consistent with abnormal   left ventricular relaxation (grade 1 diastolic dysfunction). - Aortic valve: A bioprosthesis was present. There was trivial   regurgitation. - Left atrium: The atrium was mildly dilated. - Atrial septum: There was an atrial septal aneurysm. - Pericardium, extracardiac: A small pericardial effusion was   identified.  Impressions:  - Normal LV systolic function; grade 1 diastolic dysfunction; mild   LVH; s/p AVR with trace AI and mean gradient of 18 mmHg; mild   LAE; trace MR.  Myoview 10/22/16: Study Highlights     The left ventricular ejection fraction is mildly decreased (45-54%).  Nuclear stress EF: 53%.  No T wave inversion was noted during stress.  There was no ST segment deviation noted during stress.  This is a low risk study.   No reversible ischemia. LVEF 53% with normal wall motion. This is a low risk study.  ASSESSMENT AND PLAN:  1.  CAD s/p CABG in October  2016 following NSTEMI.   Myoview negative in May 2018. Continue statin. He remains asymptomatic. Not on beta blocker due to bradycardia.  2. S/p bioprosthetic AVR for aortic stenosis. On routine SBE prophylaxis. Plan to check Echo 5 years out from surgery. 3.  Hyperlipidemia. Satisfactory on Crestor 5 mg daily 4. HTN. With white coat syndrome. Continue same meds.  6.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.   Vance Belcourt [name] MD,FACC 06/18/2018 3:58 PM

## 2018-06-18 ENCOUNTER — Ambulatory Visit: Payer: Medicare Other | Admitting: Cardiology

## 2018-06-18 ENCOUNTER — Encounter: Payer: Self-pay | Admitting: Cardiology

## 2018-06-18 VITALS — BP 160/80 | HR 55 | Ht 68.0 in | Wt 207.0 lb

## 2018-06-18 DIAGNOSIS — E785 Hyperlipidemia, unspecified: Secondary | ICD-10-CM | POA: Diagnosis not present

## 2018-06-18 DIAGNOSIS — I10 Essential (primary) hypertension: Secondary | ICD-10-CM | POA: Diagnosis not present

## 2018-06-18 DIAGNOSIS — I2510 Atherosclerotic heart disease of native coronary artery without angina pectoris: Secondary | ICD-10-CM | POA: Diagnosis not present

## 2018-06-18 DIAGNOSIS — Z951 Presence of aortocoronary bypass graft: Secondary | ICD-10-CM

## 2018-06-18 DIAGNOSIS — Z952 Presence of prosthetic heart valve: Secondary | ICD-10-CM | POA: Diagnosis not present

## 2018-07-16 ENCOUNTER — Other Ambulatory Visit: Payer: Self-pay | Admitting: Cardiology

## 2018-07-17 NOTE — Progress Notes (Signed)
Subjective:   Troy Banks was seen in consultation in the movement disorder clinic at the request of [name], MD.  The evaluation is for tremor.  The records that were made available to me were reviewed.  Pt is accompanied by his significant other who supplements the hx.    The patient is a 78 y.o. left handed male with a history of tremor.  Pt reports tremor for at least 30 years.  Pt has noted increasing tremor over the years. Tremor is mostly in the right hand, but it is in the left as well and more rarely in the head as well.  Stress brings out the tremor.   He has never been to a neurologist for tremor.   Is currently on small amount of metoprolol - 25 mg - but that cannot be increased due to hx of bradycardia.  There is a family hx of tremor in his mother, maternal GM, maternal sister and maternal cousins.    Affected by caffeine:  No. (only drinks 2 cups coffee per week; drinks green tea) Affected by alcohol:  Used to decrease tremor but doesn't seem to any longer Affected by stress:  Yes.   Affected by fatigue:  Yes.   Spills soup if on spoon:  No. but has trouble with corn/peas Spills glass of liquid if full:  Yes.   Affects ADL's (tying shoes, brushing teeth, etc):  Yes.   (slow to tie shoes)  06/30/14 update:  Last visit, we started the patient on primidone for his tremor.  He cannot take Topamax because of history of nephrolithiasis or beta blockers because of bradycardia.  The patient reports that he is overall doing well.  For the first time in years, he was able to stand up and put on his jeans as he thinks that the medication helped his balance as well as tremor.  He has good and bad days in terms of tremor.  He has no SE in terms of tremor.  He states though that his metoprolol has been cut back since last visit because of bradycardia.    10/08/14 update:  Pt is on primidone '50mg'$  bid.  Pt states that initially it seemed helpful but nowl having tremor; had an episode  last week where couldn't shave because of tremor (not unusual to see tremor while shaving but almost unable to shave) and then whole body started shaking.  Lasted all day.  He had more word finding issues that day (has baseline word finding issues).  States that "I am concerned about something beyond familial tremor."  He is worried about PD.  Having cramping in the fingers and sometimes in the toes. Hands/fingers more than the feet.  He notices that his right arm "jerks", but thinks that that is due to "a botched neck surgery."  No significant issues with swallowing.  No diplopia.  10/30/14 update:  The patient is seen today, accompanied by his wife who supplements the history.  He had an MRI of the brain on 10/24/2014 that was unremarkable with exception of mild small vessel disease.  He had an MRI of the cervical spine demonstrating stenosis at several levels, but especially at the C6-C7 level and C3-C4 levels.  There was questionable thyroid enlargement and he was asked to follow up with his primary care physician in that regard.  On 10/28/2014 he underwent an EMG at our office.  This suggested chronic on active changes and intraspinal lesion at the right C6 level  and right S1 levels.  The etiology of this is unknown, but it could be anywhere from compressive etiologies to degeneration of the anterior horn cells.  He does not meet criteria for motor neuron disease at this point in time.  In regards to tremor, he has a few what he calls "episodes" in which his arms or legs will tremor more and may last 45 seconds.  BS was normal.  No known stress sets them off.  He may have a stuttering speech when it happens.    03/17/15 update:  The patient presents for follow-up today.  He is on primidone, 50 mg bid for essential tremor. Tremor has picked up but mostly with stress.  He thinks that we need to increase the primidone.   He is not a candidate for Topamax because of nephrolithiasis and is not a candidate for  beta blockers because of bradycardia.  We have worked him up extensively because of fasciculations.   No falls but "my balance is way off."   He had an MRI of the lumbar spine after we last met.  This was done on June 7.  There was evidence of severe facet arthrosis at the L4-L5 level and L5-S1 levels.  I told him that these findings do not explain all of his findings, including fasciculations.  He had an EEG on June 2 that was unremarkable.  This was done because of episodes of transient alteration of awareness that his wife had described.  His wife states that they didn't do the prolonged EEG because his insurance wouldn't allow it without a significant copay.  They are planning on r/s soon.  07/21/15 update:  The patient is seen today in follow-up, accompanied by his wife who supplements the history.  Much has happened since our last visit and I did review records.  Last visit, I increased his primidone to 100 mg in the morning and 50 mg at night.  He ended up being hospitalized in October, 2016 with an NSTEMI and ultimately required a coronary artery bypass graft and aortic valve replacement on 03/30/2015.  While in the hospital for this, he developed atrial fibrillation and was started on amiodarone.  Not surprisingly, this caused a significant increase in tremor.  He was rehospitalized in January, 2017 for acute gout and while he was in there he mentioned that tremor had increased.  His amiodarone was stopped.  He did have a repeat EMG since our last visit and this looked much better, without evidence of motor neuron disease.  He does feel that his balance has been a bit off.  Did not do cardiac rehab after CABG.    12/18/15 update:  The patient is seen today in follow-up.  He is accompanied by his wife who supplements the history.  I have reviewed prior records made available to me.  He remains on primidone, 100 mg in the morning and 50 mg at night. Been after amiodarone for 7 months and at the beginning of  May, he felt that tremor was much better but after hip surgery, felt that he regressed.  Surgery done under spinal anesthesia.  He had a total hip replacement on 10/27/2015.  Only did a week of therapy after that.  Felt that balance and tremor was worse after surgery.  Been losing weight through exercise.  Was put on synthroid 3 months ago.  04/19/16 update:  Pt f/u today accompanied by his wife who supplements the hx.  On primidone for ET,100 mg in  the AM/50 mg at night.  Has good days and bad days with tremor.  Reviewed Dr. Doug Sou notes.  Still off of amiodarone.  Is planning on selling home in spring.  Planning on moving to [place], [place].  Lost weight with lifestyle changes.  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  Mood much better now that back at work 5 days a week.  10/17/16 update:  Patient seen today in follow-up, accompanied by his wife who supplements the history.  The patient remains on primidone, 100 mg bid which he had increased since last visit.  He states that helped and "you can actually read my signature."  Biggest issue is low energy and he thinks that is related to his heart but he doesn't want to see the cardiologist.  He is too scared he would need another surgery.  Having MS chest pain over the scar.    Carotid ultrasound on 04/19/2016 was normal, with 1-39% stenosis bilaterally.  Little more imbalance.  Had to go on prednisone for gout.  Hasn't had A1C checked in a year.  Rarely checks BS at home - not checked in 3 weeks and "I know its high."    02/22/17 update:  Pt seen in f/u for ET.    The records that were made available to me were reviewed.  Had cystoscopy and lithotripsy since last visit.  After that tremor increased.  Called here and we increased his primidone to 150 in the AM and continued 100 mg at night.  He notes that stress makes it worse as does eating sugar.  States that he thinks that this is what he is taking but wife does his medication so he isn't  positive about that.  He is c/o loss of balance.  Is prediabetic.  Is not eating sugary foods.  Having subacute CP.  Worried about that.    07/25/17 update: Patient is seen today in follow-up for essential tremor.  I have reviewed records made available to me.  Patient is currently on 150 mg of primidone in the morning and 100 mg at night.  He reports that tremor picked up a bit after he was exposed to anesthesia.  Records have been reviewed since last visit.  Has been in the hospital a few times since our last visit.  Was in the hospital in September as he fell off a roof and landed on the left ankle.  He underwent external fixation on February 22, 2017 but could not have ankle fusion at the time because of significant swelling.  He was also found to have an acute L2 compression fracture.  He was discharged to a nursing facility for skilled rehab at [facility].  He liked therapy there but otherwise didn't care for the facility.  He returned to the hospital in October for fusion of the left ankle and external fixator pin site debridement.  Patient has been faithfully attending physical therapy.  He f/u 3/12 and he hopes to get rid of boot then.  01/22/18 update: Patient is seen today in follow-up for essential tremor.  He is on primidone 150 mg in the morning and 100 mg at night.  Wife states tremor is worse but pt states that it is intermittently worse, esp with fatigue/stress.  Walking/balance is better as he changed shoes.   He is still riding motorcycles.  Records are reviewed since last visit.  He did attend physical therapy for left ankle pain.  He has also been seen  for buttocks cyst which became infected.  His insurance denied surgery because it wasn't infected at the time and told him it was elective.  Pt states that "it will come back."  07/25/18 update: Patient is seen today in follow-up for essential tremor.  He is accompanied by his wife who supplements his history.  Patient is on primidone, 150 mg  in the morning and 100 mg at night.  Tremor seems to have worse times.  Records are reviewed.  He last saw cardiology on January 13.  No medication changes were made.  Asked today about focused ultrasound.  Mentions some balance problems today.  No falls but trouble walking distances.  Asks about handicap sticker.    Current/Previously tried tremor medications: neurontin - was up to 300 mg bid for rotator cuff issues but never seemed to help tremor; back on it at low dose for hip pain.    Outside reports reviewed: historical medical records and referral letter/letters.  Allergies  Allergen Reactions  . Benzodiazepines Other (See Comments)    Patient states he gets stroke symptoms with benzo's.   . Coreg [Carvedilol] Other (See Comments)    Accumulates and causes stroke-like side-effects due to 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 Patient poor metabolizer of CYP2D6 - Coreg undergoes extensive hepatic (including 2D6)  . Lisinopril Other (See Comments)    Hyperkalemia. 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  . Mobic [Meloxicam] Other (See Comments)    Accumulates and causes stroke-like side-effects due to 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  . Sertraline Hcl Other (See Comments)    Accumulates and causes stroke-like side-effects due to 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  . Tramadol Other (See Comments)    Accumulates and causes stroke-like side-effects due to 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  . Atorvastatin Other (See Comments)    MYALGIAS  . Colchicine Other (See Comments)    ataxia  . Compazine [Prochlorperazine Maleate] Other (See Comments)    Accumulates and causes stroke-like side-effects due to 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  . Flomax [Tamsulosin Hcl]     Accumulates and causes stroke-like side-effects due to 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  . Ondansetron Other (See Comments)    Accumulates and causes stroke-like side-effects due to  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   . Promethazine Other (See Comments)    Accumulates and causes stroke-like side-effects due to 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  . Acyclovir And Related Other (See Comments)    Accumulates and causes stroke-like side-effects due to 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    Outpatient Encounter Medications as of 07/25/2018  Medication Sig  . acetaminophen (TYLENOL) 500 MG tablet Take 2 tablets (1,000 mg total) by mouth every 6 (six) hours.  [name] allopurinol (ZYLOPRIM) 300 MG tablet TAKE 1 TABLET BY MOUTH EVERY DAY  . amLODipine (NORVASC) 10 MG tablet TAKE 1 TABLET BY MOUTH EVERY DAY  . Ascorbic Acid (VITAMIN C) 1000 MG tablet Take 1,000 mg by mouth 2 (two) times daily. (0900 & 2100)  . aspirin 81 MG chewable tablet Chew 81 mg by mouth daily. (0900)  . Carboxymethylcellulose Sodium (LUBRICANT EYE DROPS OP) Place 1 drop into both eyes at bedtime.  . cholecalciferol (VITAMIN D) 1000 units tablet Take 1,000 Units by mouth 2 (two) times daily. (0900 & 2100)  . furosemide (LASIX) 40 MG tablet TAKE 1 TABLET BY MOUTH EVERY OTHER DAY  . levothyroxine (SYNTHROID, LEVOTHROID) 25 MCG tablet TAKE 1 TABLET BY MOUTH EVERY DAY BEFORE BREAKFAST  . polyethylene glycol (MIRALAX / GLYCOLAX)  packet Take 17 g by mouth daily.  . primidone (MYSOLINE) 50 MG tablet TAKE 3 TABLETS BY MOUTH EVERY MORNING AND 2 TABLETS EVERY EVENING  . ranitidine (ZANTAC) 150 MG tablet TAKE 1 TABLET BY MOUTH 2 TIMES DAILY  . rosuvastatin (CRESTOR) 10 MG tablet Take 0.5 tablets (5 mg total) by mouth daily.  [name] tiZANidine (ZANAFLEX) 4 MG tablet Take 1 tablet (4 mg total) by mouth every 8 (eight) hours as needed for muscle spasms.  . Turmeric 500 MG CAPS Take 500 mg by mouth 2 (two) times daily. (0900 & 2100)  . vitamin B-12 (CYANOCOBALAMIN) 1000 MCG tablet Take 1,000 mcg by mouth 2 (two) times daily. (0900 & 2100)   No facility-administered encounter medications on file as of 07/25/2018.     Past Medical  History:  Diagnosis Date  . Allergy   . Anxiety    PTSD  . Aortic stenosis, moderate 03/27/2015   post AVR echo Echo 11/16: Mild LVH, EF 55-60%, normal wall motion, grade 1 diastolic dysfunction, bioprosthetic AVR okay (mean gradient 18 mmHg) with trivial AI, mild LAE, atrial septal aneurysm, small effusion   . Arthritis    "qwhere" (10/29/2015)  . Carotid artery stenosis    Carotid US 11/17: 1-39% bilateral ICA stenosis. F/u prn  . Cervical stenosis of spinal canal    fusion 2006  . CHF (congestive heart failure) ([place])    "secondry to OHS"  . Closed left pilon fracture   . COLONIC POLYPS, HX OF   . Complication of anesthesia    "takes a lot to put him under and has woken up during surgery" Difficult to wake up . PTSD; "does not metabolize RX well"Pt. reports that he gets "violent", per pt.   . Diabetes mellitus without complication (HCC)    diet controlled  . DIVERTICULOSIS, COLON   . Dysrhythmia    2 bouts of afib post op and at home but corrected with medication.  . Familial tremor 03/19/2014  . GERD (gastroesophageal reflux disease)    uses Zantac  . GOUT   . Headache   . Heart murmur   . High cholesterol   . HYPERTENSION    off ACEI 2010 because of hyperkalemia in setting of ARI  . Hypothyroidism    newly diaganosed 09/2015  . Kidney stones    remote open stone-retrieval on L  . Myocardial infarction [facility])    October 20th 2016  . OSA on CPAP   . 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 PTSD (post-traumatic stress disorder)   . Sleep apnea    On a CPAP    Past Surgical History:  Procedure Laterality Date  . ANKLE FUSION Left 03/17/2017   Procedure: FUSION  PILON FRACTURE WITH BONE GRAFTING;  Surgeon: Shona Needles, MD;  Location: [place];  Service: Orthopedics;  Laterality: Left;  . ANTERIOR FUSION CERVICAL SPINE  2002  . AORTIC VALVE REPLACEMENT N/A 03/30/2015   Procedure: AORTIC VALVE  REPLACEMENT (AVR);  Surgeon: Grace Isaac, MD;  Location: [place];  Service: Open Heart Surgery;  Laterality: N/A;  . BACK SURGERY    . CARDIAC CATHETERIZATION N/A 03/26/2015   Procedure: Left Heart Cath and Coronary Angiography;  Surgeon: Leonie Man, MD;  Location: [place] [facility];  Service: Cardiovascular;  Laterality: N/A;  . CARDIAC VALVE REPLACEMENT    . CORONARY ARTERY BYPASS GRAFT N/A 03/30/2015   Procedure: CORONARY ARTERY  BYPASS GRAFTING (CABG) x four, using left internal mammary artery and left    leg greater saphenous vein harvested endoscopically ;  Surgeon: Grace Isaac, MD;  Location: [place];  Service: Open Heart Surgery;  Laterality: N/A;  . CYSTOSCOPY/URETEROSCOPY/HOLMIUM LASER/STENT PLACEMENT Bilateral 11/07/2016   Procedure: CYSTOSCOPY/URETEROSCOPY/HOLMIUM LASER/STENT PLACEMENT;  Surgeon: Nickie Retort, MD;  Location: [facility];  Service: Urology;  Laterality: Bilateral;  . CYSTOSCOPY/URETEROSCOPY/HOLMIUM LASER/STENT PLACEMENT Bilateral 11/23/2016   Procedure: CYSTOSCOPY/BILATERAL URETEROSCOPY/HOLMIUM LASER/STENT EXCHANGE;  Surgeon: Nickie Retort, MD;  Location: [facility];  Service: Urology;  Laterality: Bilateral;  NEEDS 90 MIN   . EXTERNAL FIXATION LEG Left 02/22/2017   Procedure: EXTERNAL FIXATION LEFT ANKLE;  Surgeon: Renette Butters, MD;  Location: [place];  Service: Orthopedics;  Laterality: Left;  . HEMORRHOID BANDING  1970s  . INGUINAL HERNIA REPAIR Left 1962  . JOINT REPLACEMENT    . [place]   "cut me open"  . POSTERIOR FUSION CERVICAL SPINE  2006  . TEE WITHOUT CARDIOVERSION N/A 03/30/2015   Procedure: TRANSESOPHAGEAL ECHOCARDIOGRAM (TEE);  Surgeon: Grace Isaac, MD;  Location: [place];  Service: Open Heart Surgery;  Laterality: N/A;  . TESTICLE TORSION REDUCTION Right 1960  . TOE FUSION Left 1985 & 2004   "great toe"  . TONSILLECTOMY    . TOTAL HIP ARTHROPLASTY Right 10/27/2015   Procedure: RIGHT TOTAL HIP ARTHROPLASTY  ANTERIOR APPROACH and steroid injection right foot;  Surgeon: Mcarthur Rossetti, MD;  Location: [place];  Service: Orthopedics;  Laterality: Right;    Social History   Socioeconomic History  . Marital status: Single    Spouse name: Karmen Bongo, RN  . Number of children: 2  . Years of education: Not on file  . Highest education level: Not on file  Occupational History  . Occupation: Retired Wellsite geologist  . Financial resource strain: Not on file  . Food insecurity:    Worry: Not on file    Inability: Not on file  . Transportation needs:    Medical: Not on file    Non-medical: Not on file  Tobacco Use  . Smoking status: Former Smoker    Packs/day: 1.00    Years: 34.00    Pack years: 34.00    Types: Cigarettes    Last attempt to quit: 06/06/1990    Years since quitting: 28.1  . Smokeless tobacco: Never Used  . Tobacco comment: widowed 1991 - lives with SO [name] who is RN  Substance and Sexual Activity  . Alcohol use: Yes    Alcohol/week: 1.0 standard drinks    Types: 1 Glasses of wine per week    Comment: occasional  . Drug use: No  . Sexual activity: Yes  Lifestyle  . Physical activity:    Days per week: Not on file    Minutes per session: Not on file  . Stress: Not on file  Relationships  . Social connections:    Talks on phone: Not on file    Gets together: Not on file    Attends religious service: Not on file    Active member of club or organization: Not on file    Attends meetings of clubs or organizations: Not on file    Relationship status: Not on file  . Intimate partner violence:    Fear of current or ex partner: Not on file    Emotionally abused: Not on file    Physically abused: Not on  file    Forced sexual activity: Not on file  Other Topics Concern  . Not on file  Social History Narrative  . Not on file    Family Status  Relation Name Status  . Mother  Deceased       Heart Disease, tremor  . Father  Deceased        Heart Disease  . Sister  Deceased       tremor  . Brother  (Not Specified)       unknown  . Brother  Alive       healthy  . Son  Alive       healthy  . Mat Uncle  (Not Specified)  . MGM  (Not Specified)  . MGF  (Not Specified)  . PGF  (Not Specified)  . Other  (Not Specified)  . Neg Hx  (Not Specified)    Review of Systems Review of Systems  Constitutional: Negative.   HENT: Negative.   Eyes: Negative.   Respiratory: Negative.   Gastrointestinal: Negative.   Genitourinary: Negative.   Musculoskeletal: Negative.   Skin: Negative.   Neurological: Positive for tremors.  Endo/Heme/Allergies: Negative.       Objective:   VITALS:   Vitals:   07/25/18 0756  BP: (!) 170/88  Pulse: (!) 54  SpO2: 96%  Weight: 212 lb (96.2 kg)  Height: '5\' 8"'$  (1.727 m)   Wt Readings from Last 3 Encounters:  07/25/18 212 lb (96.2 kg)  06/18/18 207 lb (93.9 kg)  03/13/18 204 lb (92.5 kg)    GEN:  The patient appears stated age and is in NAD. HEENT:  Normocephalic, atraumatic.  The mucous membranes are moist. The superficial temporal arteries are without ropiness or tenderness. CV:  Loletha Grayer.  regular Lungs:  CTAB Neck/HEME:  There are no carotid bruits bilaterally.  Neurological examination:  Orientation: The patient is alert and oriented x3. Cranial nerves: There is good facial symmetry. The speech is fluent and clear. Soft palate rises symmetrically and there is no tongue deviation. Hearing is intact to conversational tone. Sensation: Sensation is intact to light touch throughout Motor: Strength is 5/5 in the bilateral upper and lower extremities.   Shoulder shrug is equal and symmetric.  There is no pronator drift. Gait:  MOVEMENT EXAM: Tremor:  There is intermittent head tremor.  There is intention tremor bilaterally.  There is mild to mod postural tremor.  Has trouble with archimedes spirals, L more than R, especially with getting hand on the paper.    Labs:    Chemistry        Component Value Date/Time   NA 140 10/31/2017 1528   K 3.5 10/31/2017 1528   CL 104 10/31/2017 1528   CO2 26 10/31/2017 1528   BUN 19 10/31/2017 1528   CREATININE 0.84 10/31/2017 1528   CREATININE 0.94 03/04/2016 0918      Component Value Date/Time   CALCIUM 9.5 10/31/2017 1528   ALKPHOS 56 10/31/2017 1528   AST 14 10/31/2017 1528   ALT 12 10/31/2017 1528   BILITOT 0.9 10/31/2017 1528     Lab Results  Component Value Date   HGBA1C 5.6 10/31/2017   Lab Results  Component Value Date   WBC 8.3 10/31/2017   HGB 14.2 10/31/2017   HCT 42.1 10/31/2017   MCV 89.6 10/31/2017   PLT 149.0 (L) 10/31/2017   Lab Results  Component Value Date   TSH 2.24 10/31/2017        Assessment/Plan:  1.  Essential Tremor.  -This is evidenced by the symmetrical nature and longstanding hx of gradually getting worse and family hx.  -increase primidone to '50mg'$ , 3 in the am, 3 in the pm.   Previously contacted pharmacy previously due to his hx of cytochrome P450 problems but told that no interaction and not an inducer of this system.  Had increased tremor previously with amiodarone.    Risks, benefits, side effects and alternative therapies were discussed.  The opportunity to ask questions was given and they were answered to the best of my ability.  The patient expressed understanding and willingness to follow the outlined treatment protocols.  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  Told him that DBS, if he entertained that, could make loss of balance worse in 30% of cases if done bilaterally.  Handicap placard provided at his request.  -asks about DBS but ultimately decided to hold on proceeding with work up.  Asks today about focused ultrasound.  We have discussed this previously.  Discussed the differences between DBS and focused ultrasound.  Discussed that no center [place] is currently doing focused ultrasound.  Discussed this is  generally considered second line to DBS therapy.  Discussed that the closest center is [facility].  Happy to provide a referral if he would like but doesn't want.  Does express that he has some depression and would need neurocognitive testing, as all patients do, prior to consideration for sx intervention.  -Not a candidate for topamax due to current nephrolithiasis and hx of same.  Not able to increase beta blocker due to h/o bradycardia.  May be able to push up neurontin but would need to push up to much higher dose.  2.  Dizziness/fatigue/balance change  -recommended against riding motorcycle.  He feels that balance is good on the motorcycle.   3.  Fasciculations  -repeat EMG in 05/2015 much improved without evidence of MND  -The patient does have a compressive lesion in the cervical spine, but I'm not sure that that explains all of his symptoms.   4.  L carotid bruit  -Carotid ultrasound on 04/19/2016 was normal, with 1-39% stenosis bilaterally.  No further testing/follow up recommended by his PA, [employer] weaver.  Brought up this up again today.  They don't wish to proceed with further eval  5.  Episodes of tremulousness, accompanied by potential speech change and ataxia according to his wife  - Routine EEG was negative and no further spells. 6. F/u 6 months.  Much greater than 50% of this visit was spent in counseling and coordinating care.  Total face to face time:  30 min

## 2018-07-25 ENCOUNTER — Encounter

## 2018-07-25 ENCOUNTER — Ambulatory Visit: Payer: Medicare Other | Admitting: Neurology

## 2018-07-25 ENCOUNTER — Encounter: Payer: Self-pay | Admitting: Neurology

## 2018-07-25 VITALS — BP 170/88 | HR 54 | Ht 68.0 in | Wt 212.0 lb

## 2018-07-25 DIAGNOSIS — G250 Essential tremor: Secondary | ICD-10-CM | POA: Diagnosis not present

## 2018-07-25 DIAGNOSIS — R2689 Other abnormalities of gait and mobility: Secondary | ICD-10-CM | POA: Diagnosis not present

## 2018-07-25 MED ORDER — PRIMIDONE 50 MG PO TABS: 150.0000 mg | ORAL_TABLET | Freq: Two times a day (BID) | ORAL | 1 refills | Status: DC

## 2018-07-25 NOTE — Patient Instructions (Signed)
Take primidone - 50 mg - 3 tablets in the AM and 3 in the PM

## 2018-08-13 ENCOUNTER — Other Ambulatory Visit: Payer: Self-pay | Admitting: Internal Medicine

## 2018-09-05 ENCOUNTER — Other Ambulatory Visit: Payer: Self-pay | Admitting: Internal Medicine

## 2018-09-10 ENCOUNTER — Telehealth: Payer: Self-pay | Admitting: Neurology

## 2018-09-10 NOTE — Telephone Encounter (Signed)
Please advise 

## 2018-09-10 NOTE — Telephone Encounter (Signed)
How much would you like to increase the primidone?

## 2018-09-10 NOTE — Telephone Encounter (Signed)
[name] called regarding patient's Primidone medication being increased back in February. She said that it has not helped. They are needing to know what to do at this point. Please Call. Thanks

## 2018-09-10 NOTE — Telephone Encounter (Signed)
Called Troy Banks and gave the the medication instructions to increase primidone to 4 in the am and 3 in the pm.

## 2018-09-10 NOTE — Telephone Encounter (Signed)
He is currently on 3 in the AM, 3 at night.  He can now take 4 in the AM, 3 at night.

## 2018-09-10 NOTE — Telephone Encounter (Signed)
We could probably increase that a little, but if it doesn't help there isn't a ton to be done short of surgical interventions, and I know he isn't ready for that yet)

## 2018-10-11 ENCOUNTER — Other Ambulatory Visit: Payer: Self-pay | Admitting: Internal Medicine

## 2018-10-17 ENCOUNTER — Telehealth: Payer: Self-pay | Admitting: Physician Assistant

## 2018-10-17 NOTE — Telephone Encounter (Signed)
Follow Up: ° ° ° °Pt is retuning your call. °

## 2018-10-17 NOTE — Telephone Encounter (Signed)
Smartphone/ my chart active/ consent/ pre reg completed °

## 2018-10-18 ENCOUNTER — Encounter: Payer: Self-pay | Admitting: Physician Assistant

## 2018-10-18 ENCOUNTER — Telehealth (INDEPENDENT_AMBULATORY_CARE_PROVIDER_SITE_OTHER): Payer: Medicare Other | Admitting: Physician Assistant

## 2018-10-18 ENCOUNTER — Telehealth: Payer: Self-pay

## 2018-10-18 VITALS — BP 104/70 | HR 60 | Resp 16 | Ht 68.0 in | Wt 212.0 lb

## 2018-10-18 DIAGNOSIS — E785 Hyperlipidemia, unspecified: Secondary | ICD-10-CM

## 2018-10-18 DIAGNOSIS — E119 Type 2 diabetes mellitus without complications: Secondary | ICD-10-CM

## 2018-10-18 DIAGNOSIS — I10 Essential (primary) hypertension: Secondary | ICD-10-CM

## 2018-10-18 DIAGNOSIS — Z01818 Encounter for other preprocedural examination: Secondary | ICD-10-CM

## 2018-10-18 DIAGNOSIS — E039 Hypothyroidism, unspecified: Secondary | ICD-10-CM

## 2018-10-18 DIAGNOSIS — I25810 Atherosclerosis of coronary artery bypass graft(s) without angina pectoris: Secondary | ICD-10-CM

## 2018-10-18 DIAGNOSIS — Z952 Presence of prosthetic heart valve: Secondary | ICD-10-CM

## 2018-10-18 DIAGNOSIS — I6523 Occlusion and stenosis of bilateral carotid arteries: Secondary | ICD-10-CM

## 2018-10-18 MED ORDER — NITROGLYCERIN 0.4 MG SL SUBL: 0.4000 mg | SUBLINGUAL_TABLET | SUBLINGUAL | 3 refills | Status: DC | PRN

## 2018-10-18 NOTE — Patient Instructions (Signed)
Medication Instructions:   If you need a refill on your cardiac medications before your next appointment, please call your pharmacy.   Lab work:  You will need to have labs (blood work) drawn on Friday 10/19/2018 for your procedure:  BMET CBC  If you have labs (blood work) drawn today and your tests are completely normal, you will receive your results only by: [name] MyChart Message (if you have MyChart) OR . A paper copy in the mail If you have any lab test that is abnormal or we need to change your treatment, we will call you to review the results.  Testing/Procedures:  Your physician has requested that you have a cardiac catheterization. Cardiac catheterization is used to diagnose and/or treat various heart conditions. Doctors may recommend this procedure for a number of different reasons. The most common reason is to evaluate chest pain. Chest pain can be a symptom of coronary artery disease (CAD), and cardiac catheterization can show whether plaque is narrowing or blocking your heart's arteries. This procedure is also used to evaluate the valves, as well as measure the blood flow and oxygen levels in different parts of your heart. For further information please visit [email or web address]. Please follow instruction sheet, as given.   Follow-Up: At [facility], you and your health needs are our priority.  As part of our continuing mission to provide you with exceptional heart care, we have created designated Provider Care Teams.  These Care Teams include your primary Cardiologist (physician) and Advanced Practice Providers (APPs -  Physician Assistants and Nurse Practitioners) who all work together to provide you with the care you need, when you need it. . You will need a follow up appointment in 2 weeks after your CATH with  [name], MD   Any Other Special Instructions Will Be Listed Below (If Applicable).         [address] [place] [place] [address] Dept: [phone] Loc: [place]  10/18/2018  You are scheduled for a Cardiac Catheterization on Thursday, May 21 with Dr. Glenetta Hew.  1. Please arrive at the [facility] (Main Entrance A) at [facility]: [address] [place], [place] [address] at 5:30 AM (This time is two hours before your procedure to ensure your preparation). Free valet parking service is available.   Special note: Every effort is made to have your procedure done on time. Please understand that emergencies sometimes delay scheduled procedures.  2. Diet: Do not eat solid foods after midnight.  The patient may have clear liquids until 5am upon the day of the procedure.  3. Labs: You will need to have blood drawn on Friday, May 15 at [place]  Open: 8am - 5pm (Lunch 12:30 - 1:30)   Phone: [phone]. You do not need to be fasting.  4. Medication instructions in preparation for your procedure:   Contrast Allergy: No   *For reference purposes while preparing patient instructions.   Delete this med list prior to printing instructions for patient.*     On the morning of your procedure, take your Aspirin and any morning medicines NOT listed above.  You may use sips of water.  5. Plan for one night stay--bring personal belongings. 6. Bring a current list of your medications and current insurance cards. 7. You MUST have a responsible person to drive you home. 8. Someone MUST be with you  the first 24 hours after you arrive home or your discharge will be delayed. 9. Please wear clothes that are easy to get on and off and wear slip-on shoes.  Thank you for allowing [place] to care for you!   -- [facility]

## 2018-10-18 NOTE — Telephone Encounter (Signed)
Virtual Visit Pre-Appointment Phone Call  "(Name), I am calling you today to discuss your upcoming appointment. We are currently trying to limit exposure to the virus that causes COVID-19 by seeing patients at home rather than in the office."  1. "What is the BEST phone number to call the day of the visit?" - include this in appointment notes  2. "Do you have or have access to (through a family member/friend) a smartphone with video capability that we can use for your visit?" a. If yes - list this number in appt notes as "cell" (if different from BEST phone #) and list the appointment type as a VIDEO visit in appointment notes b. If no - list the appointment type as a PHONE visit in appointment notes  3. Confirm consent - "In the setting of the current Covid19 crisis, you are scheduled for a VIDEO visit with your provider on 10/18/2018 at 9:30AM.  Just as we do with many in-office visits, in order for you to participate in this visit, we must obtain consent.  If you'd like, I can send this to your mychart (if signed up) or email for you to review.  Otherwise, I can obtain your verbal consent now.  All virtual visits are billed to your insurance company just like a normal visit would be.  By agreeing to a virtual visit, we'd like you to understand that the technology does not allow for your provider to perform an examination, and thus may limit your provider's ability to fully assess your condition. If your provider identifies any concerns that need to be evaluated in person, we will make arrangements to do so.  Finally, though the technology is pretty good, we cannot assure that it will always work on either your or our end, and in the setting of a video visit, we may have to convert it to a phone-only visit.  In either situation, we cannot ensure that we have a secure connection.  Are you willing to proceed?" STAFF: Did the patient verbally acknowledge consent to telehealth visit? Document YES/NO  here: YES  4. Advise patient to be prepared - "Two hours prior to your appointment, go ahead and check your blood pressure, pulse, oxygen saturation, and your weight (if you have the equipment to check those) and write them all down. When your visit starts, your provider will ask you for this information. If you have an Apple Watch or Kardia device, please plan to have heart rate information ready on the day of your appointment. Please have a pen and paper handy nearby the day of the visit as well."  5. Give patient instructions for MyChart download to smartphone OR [email or web address] as below if video visit (depending on what platform provider is using)  6. Inform patient they will receive a phone call 15 minutes prior to their appointment time (may be from unknown caller ID) so they should be prepared to answer    TELEPHONE CALL NOTE  [name] has been deemed a candidate for a follow-up tele-health visit to limit community exposure during the Covid-19 pandemic. I spoke with the patient via phone to ensure availability of phone/video source, confirm preferred email & phone number, and discuss instructions and expectations.  I reminded [name] to be prepared with any vital sign and/or heart rhythm information that could potentially be obtained via home monitoring, at the time of his visit. I reminded [name] to expect a phone call prior to his visit.  [name], [place] 10/18/2018 10:25 AM   INSTRUCTIONS FOR DOWNLOADING THE MYCHART APP TO SMARTPHONE  - The patient must first make sure to have activated MyChart and know their login information - If Apple, go to [employer] and type in MyChart in the search bar and download the app. If Android, ask patient to go to [employer] and type in [place] in the search bar and download the app. The app is free but as with any other app downloads, their phone may require them to verify saved payment information or  Apple/Android password.  - The patient will need to then log into the app with their MyChart username and password, and select [facility] as their healthcare provider to link the account. When it is time for your visit, go to the MyChart app, find appointments, and click Begin Video Visit. Be sure to Select Allow for your device to access the Microphone and Camera for your visit. You will then be connected, and your provider will be with you shortly.  **If they have any issues connecting, or need assistance please contact MyChart service desk [phone])**  **If using a computer, in order to ensure the best quality for their visit they will need to use either of the following Internet Browsers: [employer], or Google Chrome**  IF USING DOXIMITY or [email or web address].ME - The patient will receive a link just prior to their visit by text.     FULL LENGTH CONSENT FOR TELE-HEALTH VISIT   I hereby voluntarily request, consent and authorize [place] and its employed or contracted physicians, physician assistants, nurse practitioners or other licensed health care professionals (the Practitioner), to provide me with telemedicine health care services (the "Services") as deemed necessary by the treating Practitioner. I acknowledge and consent to receive the Services by the Practitioner via telemedicine. I understand that the telemedicine visit will involve communicating with the Practitioner through live audiovisual communication technology and the disclosure of certain medical information by electronic transmission. I acknowledge that I have been given the opportunity to request an in-person assessment or other available alternative prior to the telemedicine visit and am voluntarily participating in the telemedicine visit.  I understand that I have the right to withhold or withdraw my consent to the use of telemedicine in the course of my care at any time, without affecting my right to future care  or treatment, and that the Practitioner or I may terminate the telemedicine visit at any time. I understand that I have the right to inspect all information obtained and/or recorded in the course of the telemedicine visit and may receive copies of available information for a reasonable fee.  I understand that some of the potential risks of receiving the Services via telemedicine include:  [name] Delay or interruption in medical evaluation due to technological equipment failure or disruption; . Information transmitted may not be sufficient (e.g. poor resolution of images) to allow for appropriate medical decision making by the Practitioner; and/or  . In rare instances, security protocols could fail, causing a breach of personal health information.  Furthermore, I acknowledge that it is my responsibility to provide information about my medical history, conditions and care that is complete and accurate to the best of my ability. I acknowledge that Practitioner's advice, recommendations, and/or decision may be based on factors not within their control, such as incomplete or inaccurate data provided by me or distortions of diagnostic images or specimens that may result from electronic transmissions. I understand that the  practice of medicine is not an Chief Strategy Officer and that Practitioner makes no warranties or guarantees regarding treatment outcomes. I acknowledge that I will receive a copy of this consent concurrently upon execution via email to the email address I last provided but may also request a printed copy by calling the office of [facility].    I understand that my insurance will be billed for this visit.   I have read or had this consent read to me. . I understand the contents of this consent, which adequately explains the benefits and risks of the Services being provided via telemedicine.  . I have been provided ample opportunity to ask questions regarding this consent and the Services and have had  my questions answered to my satisfaction. . I give my informed consent for the services to be provided through the use of telemedicine in my medical care  By participating in this telemedicine visit I agree to the above.

## 2018-10-18 NOTE — Progress Notes (Signed)
Virtual Visit via Video Note   This visit type was conducted due to national recommendations for restrictions regarding the COVID-19 Pandemic (e.g. social distancing) in an effort to limit this patient's exposure and mitigate transmission in our community.  Due to his co-morbid illnesses, this patient is at least at moderate risk for complications without adequate follow up.  This format is felt to be most appropriate for this patient at this time.  All issues noted in this document were discussed and addressed.  A limited physical exam was performed with this format.  Please refer to the patient's chart for his consent to telehealth for [facility].   Date:  10/20/2018   ID:  Troy Banks, DOB [birth year], MRN [id number]  Patient Location: Home Provider Location: Home  PCP:  [name], MD  Cardiologist:  [name], MD  Electrophysiologist:  None   Evaluation Performed:  Follow-Up Visit  Chief Complaint:  Chest pain  History of Present Illness:    Troy Banks is a 78 y.o. 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  He had a NSTEMI in October 2016, cardiac catheterizations at times demonstrated three-vessel disease.  He underwent LIMA to LAD, SVG to intermediate, SVG to left circumflex and SVG to distal RCA along with bioprosthetic AVR at the time.  Atenolol was initially added however later stopped secondary to bradycardia.  He did develop atrial fibrillation and was placed on amiodarone prior to converting to sinus rhythm.  Amiodarone was later stopped in January 2017.  He has a daughter who is a family physician in [place].  His wife is a retired [employer].  Last Myoview obtained in May 2018 showed EF 53%, no reversible ischemia.  He was last seen by Dr. [name] in January 2020 at which time he was doing well.  Patient was  contacted today via video conference visit along with his wife.  For the past 6 weeks, he has been having worsening dyspnea on exertion and chest tightness when you go out to walk his dog.  This does not occur at rest and only associated with exertion.  Last several minutes at a time before resolving.  He is not on any sublingual nitroglycerin.  He denies any recent fever, chill, cough, lower extremity edema, orthopnea or PND.  His symptom is very much reminiscent of the prior symptoms prior to bypass surgery.  I have high suspicion that patient has another blockage at this time.  I discussed the case with DOD Dr. Sallyanne Kuster as Dr. [name] is currently ill.  We have high suspicion patient is currently having stable but progressive angina.  We ultimately decided to proceed with cardiac catheterization.  Benefit and risk of the procedure has been discussed with patient who is agreeable to proceed.  The patient does not have symptoms concerning for COVID-19 infection (fever, chills, cough, or new shortness of breath).    Past Medical History:  Diagnosis Date   Allergy    Anxiety    PTSD   Aortic stenosis, moderate 03/27/2015   post AVR echo Echo 11/16: Mild LVH, EF 55-60%, normal wall motion, grade 1 diastolic dysfunction, bioprosthetic AVR okay (mean gradient 18 mmHg) with trivial AI, mild LAE, atrial septal aneurysm, small effusion    Arthritis    "qwhere" (10/29/2015)   Carotid artery stenosis    Carotid US 11/17: 1-39%  bilateral ICA stenosis. F/u prn   Cervical stenosis of spinal canal    fusion 2006   CHF (congestive heart failure) ([place])    "secondry to [facility]"   Closed left pilon fracture    COLONIC POLYPS, HX OF    Complication of anesthesia    "takes a lot to put him under and has woken up during surgery" Difficult to wake up . PTSD; "does not metabolize RX well"Pt. reports that he gets "violent", per pt.    Diabetes mellitus without complication ([place])    diet controlled    DIVERTICULOSIS, COLON    Dysrhythmia    2 bouts of afib post op and at home but corrected with medication.   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 ([place])    confirmed heterozygous 10/24/14 labs   PTSD (post-traumatic stress disorder)    Sleep apnea    On a CPAP   Past Surgical History:  Procedure Laterality Date   ANKLE FUSION Left 03/17/2017   Procedure: FUSION  PILON FRACTURE WITH BONE GRAFTING;  Surgeon: Shona Needles, MD;  Location: [place];  Service: Orthopedics;  Laterality: Left;   ANTERIOR FUSION CERVICAL SPINE  2002   AORTIC VALVE REPLACEMENT N/A 03/30/2015   Procedure: AORTIC VALVE REPLACEMENT (AVR);  Surgeon: Grace Isaac, MD;  Location: [place];  Service: Open Heart Surgery;  Laterality: N/A;   BACK SURGERY     CARDIAC CATHETERIZATION N/A 03/26/2015   Procedure: Left Heart Cath and Coronary Angiography;  Surgeon: Leonie Man, MD;  Location: [place] [facility];  Service: Cardiovascular;  Laterality: N/A;   CARDIAC VALVE REPLACEMENT     CORONARY ARTERY BYPASS GRAFT N/A 03/30/2015   Procedure: CORONARY ARTERY BYPASS GRAFTING (CABG) x four, using left internal mammary artery and left    leg greater saphenous vein harvested endoscopically ;  Surgeon: Grace Isaac, MD;  Location: [place];  Service: Open Heart Surgery;  Laterality: N/A;   CYSTOSCOPY/URETEROSCOPY/HOLMIUM LASER/STENT PLACEMENT Bilateral 11/07/2016   Procedure: CYSTOSCOPY/URETEROSCOPY/HOLMIUM LASER/STENT PLACEMENT;  Surgeon: Nickie Retort, MD;  Location: [facility];  Service: Urology;   Laterality: Bilateral;   CYSTOSCOPY/URETEROSCOPY/HOLMIUM LASER/STENT PLACEMENT Bilateral 11/23/2016   Procedure: CYSTOSCOPY/BILATERAL URETEROSCOPY/HOLMIUM LASER/STENT EXCHANGE;  Surgeon: Nickie Retort, MD;  Location: [facility];  Service: Urology;  Laterality: Bilateral;  NEEDS 90 MIN    EXTERNAL FIXATION LEG Left 02/22/2017   Procedure: EXTERNAL FIXATION LEFT ANKLE;  Surgeon: Renette Butters, MD;  Location: [place];  Service: Orthopedics;  Laterality: Left;   HEMORRHOID BANDING  1970s   INGUINAL HERNIA REPAIR Left 1962   JOINT REPLACEMENT     KIDNEY STONE SURGERY  1986   "cut me open"   POSTERIOR FUSION CERVICAL SPINE  2006   TEE WITHOUT CARDIOVERSION N/A 03/30/2015   Procedure: TRANSESOPHAGEAL ECHOCARDIOGRAM (TEE);  Surgeon: Grace Isaac, MD;  Location: [place];  Service: Open Heart Surgery;  Laterality: N/A;   TESTICLE TORSION REDUCTION Right 1960   TOE  FUSION Left 1985 & 2004   "great toe"   TONSILLECTOMY     TOTAL HIP ARTHROPLASTY Right 10/27/2015   Procedure: RIGHT TOTAL HIP ARTHROPLASTY ANTERIOR APPROACH and steroid injection right foot;  Surgeon: Mcarthur Rossetti, MD;  Location: [place];  Service: Orthopedics;  Laterality: Right;     Current Meds  Medication Sig   acetaminophen (TYLENOL) 500 MG tablet Take 2 tablets (1,000 mg total) by mouth every 6 (six) hours.   allopurinol (ZYLOPRIM) 300 MG tablet TAKE 1 TABLET BY MOUTH EVERY DAY   amLODipine (NORVASC) 10 MG tablet TAKE 1 TABLET BY MOUTH EVERY DAY   Ascorbic Acid (VITAMIN C) 1000 MG tablet Take 1,000 mg by mouth 2 (two) times daily. (0900 & 2100)   aspirin 81 MG chewable tablet Chew 81 mg by mouth daily. (0900)   Carboxymethylcellulose Sodium (LUBRICANT EYE DROPS OP) Place 1 drop into both eyes 2 (two) times a day.    cholecalciferol (VITAMIN D) 1000 units tablet Take 1,000 Units by mouth 2 (two) times daily. (0900 & 2100)   furosemide (LASIX) 40 MG tablet TAKE 1 TABLET BY MOUTH EVERY OTHER DAY    levothyroxine (SYNTHROID) 25 MCG tablet TAKE 1 TABLET BY MOUTH EVERY DAY BEFORE BREAKFAST   ranitidine (ZANTAC) 150 MG tablet TAKE 1 TABLET BY MOUTH 2 TIMES DAILY   rosuvastatin (CRESTOR) 10 MG tablet Take 0.5 tablets (5 mg total) by mouth daily.   tiZANidine (ZANAFLEX) 4 MG tablet Take 1 tablet (4 mg total) by mouth every 8 (eight) hours as needed for muscle spasms.   Turmeric 500 MG CAPS Take 500 mg by mouth 2 (two) times daily. (0900 & 2100)   vitamin B-12 (CYANOCOBALAMIN) 1000 MCG tablet Take 1,000 mcg by mouth 2 (two) times daily. (0900 & 2100)     Allergies:   Benzodiazepines; Coreg [carvedilol]; Lisinopril; Mobic [meloxicam]; Sertraline hcl; Tramadol; Atorvastatin; Colchicine; Compazine [prochlorperazine maleate]; Flomax [tamsulosin hcl]; Ondansetron; Promethazine; and Acyclovir and related   Social History   Tobacco Use   Smoking status: Former Smoker    Packs/day: 1.00    Years: 34.00    Pack years: 34.00    Types: Cigarettes    Last attempt to quit: 06/06/1990    Years since quitting: 28.3   Smokeless tobacco: Never Used   Tobacco comment: widowed 1991 - lives with SO [name] who is RN  Substance Use Topics   Alcohol use: Yes    Alcohol/week: 1.0 standard drinks    Types: 1 Glasses of wine per week    Comment: occasional   Drug use: No     Family Hx: The patient's family history includes Alcohol abuse in an other family member; Arthritis in an other family member; Diabetes in his mother and paternal grandfather; Heart disease in his father, maternal grandfather, maternal grandmother, mother, and paternal grandfather; Hypertension in his father, maternal grandfather, maternal grandmother, mother, and paternal grandfather; Prostate cancer in his maternal uncle. There is no history of Colon polyps, Esophageal cancer, Rectal cancer, Stomach cancer, or Colon cancer.  ROS:   Please see the history of present illness.     All other systems reviewed and are  negative.   Prior CV studies:   The following studies were reviewed today:  Myoview 11/01/2016 Study Highlights     The left ventricular ejection fraction is mildly decreased (45-54%).  Nuclear stress EF: 53%.  No T wave inversion was noted during stress.  There was no ST segment deviation noted during stress.  This is a low risk study.   No reversible ischemia. LVEF 53% with normal wall motion. This is a low risk study.     Labs/Other Tests and Data Reviewed:    EKG:  An ECG dated  was personally reviewed today and demonstrated:  Normal sinus rhythm with nonspecific T wave changes  Recent Labs: 10/31/2017: ALT 12; TSH 2.24 10/19/2018: BUN 17; Creatinine, Ser 0.92; Hemoglobin 15.9; Platelets 146; Potassium 4.3; Sodium 143   Recent Lipid Panel Lab Results  Component Value Date/Time   CHOL 115 10/31/2017 03:28 PM   TRIG 165.0 (H) 10/31/2017 03:28 PM   HDL 37.50 (L) 10/31/2017 03:28 PM   CHOLHDL 3 10/31/2017 03:28 PM   LDLCALC 45 10/31/2017 03:28 PM   LDLDIRECT 100.0 09/08/2014 09:14 AM    Wt Readings from Last 3 Encounters:  10/18/18 212 lb (96.2 kg)  07/25/18 212 lb (96.2 kg)  06/18/18 207 lb (93.9 kg)     Objective:    Vital Signs:  BP 104/70    Pulse 60    Resp 16    Ht 5\' 8"  (1.727 m)    Wt 212 lb (96.2 kg)    BMI 32.23 kg/m    VITAL SIGNS:  reviewed  ASSESSMENT & PLAN:    1. CAD s/p CABG with progressive angina: His current symptom is reminiscent of the previous angina.  I discussed the case with DOD Dr. Sallyanne Kuster, we recommend a cardiac catheterization.  - Risk and benefit of procedure explained to the patient who display clear understanding and agree to proceed.  Discussed with patient possible procedural risk include bleeding, vascular injury, renal injury, arrythmia, MI, stroke and loss of limb or life.  2. Aortic stenosis s/p AVR: Stable on last echocardiogram in 2016.  Likely is due for repeat echocardiogram, however this is not  urgent.  3. Hypertension: Blood pressure well controlled on current therapy.  4. Hyperlipidemia: Continue Crestor  5. DM2: Managed by primary care provider  6. Hypothyroidism: On Synthroid  7. Carotid artery disease: Last carotid Doppler obtained in November 2017 showed mild disease bilaterally.   COVID-19 Education: The signs and symptoms of COVID-19 were discussed with the patient and how to seek care for testing (follow up with PCP or arrange E-visit).  The importance of social distancing was discussed today.  Time:   Today, I have spent 7 min video + 6 min telephone with the patient with telehealth technology discussing the above problems.     Medication Adjustments/Labs and Tests Ordered: Current medicines are reviewed at length with the patient today.  Concerns regarding medicines are outlined above.   Tests Ordered: Orders Placed This Encounter  Procedures   Novel Coronavirus, NAA ([facility])   CBC   Basic metabolic panel    Medication Changes: Meds ordered this encounter  Medications   nitroGLYCERIN (NITROSTAT) 0.4 MG SL tablet    Sig: Place 1 tablet (0.4 mg total) under the tongue every 5 (five) minutes as needed for chest pain.    Dispense:  25 tablet    Refill:  3    Disposition:  Follow up in 3 week(s)  Signed, Almyra Deforest, PA  10/20/2018 11:11 PM    [facility]

## 2018-10-18 NOTE — Telephone Encounter (Signed)

## 2018-10-18 NOTE — H&P (View-Only) (Signed)
Virtual Visit via Video Note   This visit type was conducted due to national recommendations for restrictions regarding the COVID-19 Pandemic (e.g. social distancing) in an effort to limit this patient's exposure and mitigate transmission in our community.  Due to his co-morbid illnesses, this patient is at least at moderate risk for complications without adequate follow up.  This format is felt to be most appropriate for this patient at this time.  All issues noted in this document were discussed and addressed.  A limited physical exam was performed with this format.  Please refer to the patient's chart for his consent to telehealth for [facility].   Date:  10/20/2018   ID:  Troy Banks, DOB 02/11/1941, MRN [id number]  Patient Location: Home Provider Location: Home  PCP:  [name], MD  Cardiologist:  [name], MD  Electrophysiologist:  None   Evaluation Performed:  Follow-Up Visit  Chief Complaint:  Chest pain  History of Present Illness:    Troy Banks is a 78 y.o. 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  He had a NSTEMI in October 2016, cardiac catheterizations at times demonstrated three-vessel disease.  He underwent LIMA to LAD, SVG to intermediate, SVG to left circumflex and SVG to distal RCA along with bioprosthetic AVR at the time.  Atenolol was initially added however later stopped secondary to bradycardia.  He did develop atrial fibrillation and was placed on amiodarone prior to converting to sinus rhythm.  Amiodarone was later stopped in January 2017.  He has a daughter who is a family physician in [place].  His wife is a retired [employer] manager.  Last Myoview obtained in May 2018 showed EF 53%, no reversible ischemia.  He was last seen by Dr. [name] in January 2020 at which time he was doing well.  Patient was  contacted today via video conference visit along with his wife.  For the past 6 weeks, he has been having worsening dyspnea on exertion and chest tightness when you go out to walk his dog.  This does not occur at rest and only associated with exertion.  Last several minutes at a time before resolving.  He is not on any sublingual nitroglycerin.  He denies any recent fever, chill, cough, lower extremity edema, orthopnea or PND.  His symptom is very much reminiscent of the prior symptoms prior to bypass surgery.  I have high suspicion that patient has another blockage at this time.  I discussed the case with DOD Dr. Sallyanne Kuster as Dr. [name] is currently ill.  We have high suspicion patient is currently having stable but progressive angina.  We ultimately decided to proceed with cardiac catheterization.  Benefit and risk of the procedure has been discussed with patient who is agreeable to proceed.  The patient does not have symptoms concerning for COVID-19 infection (fever, chills, cough, or new shortness of breath).    Past Medical History:  Diagnosis Date   Allergy    Anxiety    PTSD   Aortic stenosis, moderate 03/27/2015   post AVR echo Echo 11/16: Mild LVH, EF 55-60%, normal wall motion, grade 1 diastolic dysfunction, bioprosthetic AVR okay (mean gradient 18 mmHg) with trivial AI, mild LAE, atrial septal aneurysm, small effusion    Arthritis    "qwhere" (10/29/2015)   Carotid artery stenosis    Carotid US 11/17: 1-39%  bilateral ICA stenosis. F/u prn   Cervical stenosis of spinal canal    fusion 2006   CHF (congestive heart failure) ([place])    "secondry to [facility]"   Closed left pilon fracture    COLONIC POLYPS, HX OF    Complication of anesthesia    "takes a lot to put him under and has woken up during surgery" Difficult to wake up . PTSD; "does not metabolize RX well"Pt. reports that he gets "violent", per pt.    Diabetes mellitus without complication ([place])    diet controlled    DIVERTICULOSIS, COLON    Dysrhythmia    2 bouts of afib post op and at home but corrected with medication.   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 ([place])    confirmed heterozygous 10/24/14 labs   PTSD (post-traumatic stress disorder)    Sleep apnea    On a CPAP   Past Surgical History:  Procedure Laterality Date   ANKLE FUSION Left 03/17/2017   Procedure: FUSION  PILON FRACTURE WITH BONE GRAFTING;  Surgeon: Shona Needles, MD;  Location: [place];  Service: Orthopedics;  Laterality: Left;   ANTERIOR FUSION CERVICAL SPINE  2002   AORTIC VALVE REPLACEMENT N/A 03/30/2015   Procedure: AORTIC VALVE REPLACEMENT (AVR);  Surgeon: Grace Isaac, MD;  Location: [place];  Service: Open Heart Surgery;  Laterality: N/A;   BACK SURGERY     CARDIAC CATHETERIZATION N/A 03/26/2015   Procedure: Left Heart Cath and Coronary Angiography;  Surgeon: Leonie Man, MD;  Location: [place] CV [facility];  Service: Cardiovascular;  Laterality: N/A;   CARDIAC VALVE REPLACEMENT     CORONARY ARTERY BYPASS GRAFT N/A 03/30/2015   Procedure: CORONARY ARTERY BYPASS GRAFTING (CABG) x four, using left internal mammary artery and left    leg greater saphenous vein harvested endoscopically ;  Surgeon: Grace Isaac, MD;  Location: [place];  Service: Open Heart Surgery;  Laterality: N/A;   CYSTOSCOPY/URETEROSCOPY/HOLMIUM LASER/STENT PLACEMENT Bilateral 11/07/2016   Procedure: CYSTOSCOPY/URETEROSCOPY/HOLMIUM LASER/STENT PLACEMENT;  Surgeon: Nickie Retort, MD;  Location: [facility];  Service: Urology;   Laterality: Bilateral;   CYSTOSCOPY/URETEROSCOPY/HOLMIUM LASER/STENT PLACEMENT Bilateral 11/23/2016   Procedure: CYSTOSCOPY/BILATERAL URETEROSCOPY/HOLMIUM LASER/STENT EXCHANGE;  Surgeon: Nickie Retort, MD;  Location: [facility];  Service: Urology;  Laterality: Bilateral;  NEEDS 90 MIN    EXTERNAL FIXATION LEG Left 02/22/2017   Procedure: EXTERNAL FIXATION LEFT ANKLE;  Surgeon: Renette Butters, MD;  Location: [place];  Service: Orthopedics;  Laterality: Left;   HEMORRHOID BANDING  1970s   INGUINAL HERNIA REPAIR Left 1962   JOINT REPLACEMENT     KIDNEY STONE SURGERY  1986   "cut me open"   POSTERIOR FUSION CERVICAL SPINE  2006   TEE WITHOUT CARDIOVERSION N/A 03/30/2015   Procedure: TRANSESOPHAGEAL ECHOCARDIOGRAM (TEE);  Surgeon: Grace Isaac, MD;  Location: [place];  Service: Open Heart Surgery;  Laterality: N/A;   TESTICLE TORSION REDUCTION Right 1960   TOE  FUSION Left 1985 & 2004   "great toe"   TONSILLECTOMY     TOTAL HIP ARTHROPLASTY Right 10/27/2015   Procedure: RIGHT TOTAL HIP ARTHROPLASTY ANTERIOR APPROACH and steroid injection right foot;  Surgeon: Mcarthur Rossetti, MD;  Location: [place];  Service: Orthopedics;  Laterality: Right;     Current Meds  Medication Sig   acetaminophen (TYLENOL) 500 MG tablet Take 2 tablets (1,000 mg total) by mouth every 6 (six) hours.   allopurinol (ZYLOPRIM) 300 MG tablet TAKE 1 TABLET BY MOUTH EVERY DAY   amLODipine (NORVASC) 10 MG tablet TAKE 1 TABLET BY MOUTH EVERY DAY   Ascorbic Acid (VITAMIN C) 1000 MG tablet Take 1,000 mg by mouth 2 (two) times daily. (0900 & 2100)   aspirin 81 MG chewable tablet Chew 81 mg by mouth daily. (0900)   Carboxymethylcellulose Sodium (LUBRICANT EYE DROPS OP) Place 1 drop into both eyes 2 (two) times a day.    cholecalciferol (VITAMIN D) 1000 units tablet Take 1,000 Units by mouth 2 (two) times daily. (0900 & 2100)   furosemide (LASIX) 40 MG tablet TAKE 1 TABLET BY MOUTH EVERY OTHER DAY    levothyroxine (SYNTHROID) 25 MCG tablet TAKE 1 TABLET BY MOUTH EVERY DAY BEFORE BREAKFAST   ranitidine (ZANTAC) 150 MG tablet TAKE 1 TABLET BY MOUTH 2 TIMES DAILY   rosuvastatin (CRESTOR) 10 MG tablet Take 0.5 tablets (5 mg total) by mouth daily.   tiZANidine (ZANAFLEX) 4 MG tablet Take 1 tablet (4 mg total) by mouth every 8 (eight) hours as needed for muscle spasms.   Turmeric 500 MG CAPS Take 500 mg by mouth 2 (two) times daily. (0900 & 2100)   vitamin B-12 (CYANOCOBALAMIN) 1000 MCG tablet Take 1,000 mcg by mouth 2 (two) times daily. (0900 & 2100)     Allergies:   Benzodiazepines; Coreg [carvedilol]; Lisinopril; Mobic [meloxicam]; Sertraline hcl; Tramadol; Atorvastatin; Colchicine; Compazine [prochlorperazine maleate]; Flomax [tamsulosin hcl]; Ondansetron; Promethazine; and Acyclovir and related   Social History   Tobacco Use   Smoking status: Former Smoker    Packs/day: 1.00    Years: 34.00    Pack years: 34.00    Types: Cigarettes    Last attempt to quit: 06/06/1990    Years since quitting: 28.3   Smokeless tobacco: Never Used   Tobacco comment: widowed 1991 - lives with SO [name] who is RN  Substance Use Topics   Alcohol use: Yes    Alcohol/week: 1.0 standard drinks    Types: 1 Glasses of wine per week    Comment: occasional   Drug use: No     Family Hx: The patient's family history includes Alcohol abuse in an other family member; Arthritis in an other family member; Diabetes in his mother and paternal grandfather; Heart disease in his father, maternal grandfather, maternal grandmother, mother, and paternal grandfather; Hypertension in his father, maternal grandfather, maternal grandmother, mother, and paternal grandfather; Prostate cancer in his maternal uncle. There is no history of Colon polyps, Esophageal cancer, Rectal cancer, Stomach cancer, or Colon cancer.  ROS:   Please see the history of present illness.     All other systems reviewed and are  negative.   Prior CV studies:   The following studies were reviewed today:  Myoview 11/01/2016 Study Highlights     The left ventricular ejection fraction is mildly decreased (45-54%).  Nuclear stress EF: 53%.  No T wave inversion was noted during stress.  There was no ST segment deviation noted during stress.  This is a low risk study.   No reversible ischemia. LVEF 53% with normal wall motion. This is a low risk study.     Labs/Other Tests and Data Reviewed:    EKG:  An ECG dated  was personally reviewed today and demonstrated:  Normal sinus rhythm with nonspecific T wave changes  Recent Labs: 10/31/2017: ALT 12; TSH 2.24 10/19/2018: BUN 17; Creatinine, Ser 0.92; Hemoglobin 15.9; Platelets 146; Potassium 4.3; Sodium 143   Recent Lipid Panel Lab Results  Component Value Date/Time   CHOL 115 10/31/2017 03:28 PM   TRIG 165.0 (H) 10/31/2017 03:28 PM   HDL 37.50 (L) 10/31/2017 03:28 PM   CHOLHDL 3 10/31/2017 03:28 PM   LDLCALC 45 10/31/2017 03:28 PM   LDLDIRECT 100.0 09/08/2014 09:14 AM    Wt Readings from Last 3 Encounters:  10/18/18 212 lb (96.2 kg)  07/25/18 212 lb (96.2 kg)  06/18/18 207 lb (93.9 kg)     Objective:    Vital Signs:  BP 104/70    Pulse 60    Resp 16    Ht 5\' 8"  (1.727 m)    Wt 212 lb (96.2 kg)    BMI 32.23 kg/m    VITAL SIGNS:  reviewed  ASSESSMENT & PLAN:    1. CAD s/p CABG with progressive angina: His current symptom is reminiscent of the previous angina.  I discussed the case with DOD Dr. Sallyanne Kuster, we recommend a cardiac catheterization.  - Risk and benefit of procedure explained to the patient who display clear understanding and agree to proceed.  Discussed with patient possible procedural risk include bleeding, vascular injury, renal injury, arrythmia, MI, stroke and loss of limb or life.  2. Aortic stenosis s/p AVR: Stable on last echocardiogram in 2016.  Likely is due for repeat echocardiogram, however this is not  urgent.  3. Hypertension: Blood pressure well controlled on current therapy.  4. Hyperlipidemia: Continue Crestor  5. DM2: Managed by primary care provider  6. Hypothyroidism: On Synthroid  7. Carotid artery disease: Last carotid Doppler obtained in November 2017 showed mild disease bilaterally.   COVID-19 Education: The signs and symptoms of COVID-19 were discussed with the patient and how to seek care for testing (follow up with PCP or arrange E-visit).  The importance of social distancing was discussed today.  Time:   Today, I have spent 7 min video + 6 min telephone with the patient with telehealth technology discussing the above problems.     Medication Adjustments/Labs and Tests Ordered: Current medicines are reviewed at length with the patient today.  Concerns regarding medicines are outlined above.   Tests Ordered: Orders Placed This Encounter  Procedures   Novel Coronavirus, NAA ([facility])   CBC   Basic metabolic panel    Medication Changes: Meds ordered this encounter  Medications   nitroGLYCERIN (NITROSTAT) 0.4 MG SL tablet    Sig: Place 1 tablet (0.4 mg total) under the tongue every 5 (five) minutes as needed for chest pain.    Dispense:  25 tablet    Refill:  3    Disposition:  Follow up in 3 week(s)  Signed, Almyra Deforest, PA  10/20/2018 11:11 PM    [facility]

## 2018-10-19 DIAGNOSIS — Z01818 Encounter for other preprocedural examination: Secondary | ICD-10-CM | POA: Diagnosis not present

## 2018-10-20 ENCOUNTER — Other Ambulatory Visit: Payer: Self-pay | Admitting: Physician Assistant

## 2018-10-20 LAB — CBC
Hematocrit: 46.9 % (ref 37.5–51.0)
Hemoglobin: 15.9 g/dL (ref 13.0–17.7)
MCH: 30.2 pg (ref 26.6–33.0)
MCHC: 33.9 g/dL (ref 31.5–35.7)
MCV: 89 fL (ref 79–97)
Platelets: 146 10*3/uL — ABNORMAL LOW (ref 150–450)
RBC: 5.27 x10E6/uL (ref 4.14–5.80)
RDW: 13.1 % (ref 11.6–15.4)
WBC: 6 10*3/uL (ref 3.4–10.8)

## 2018-10-20 LAB — BASIC METABOLIC PANEL
BUN/Creatinine Ratio: 18 (ref 10–24)
BUN: 17 mg/dL (ref 8–27)
CO2: 23 mmol/L (ref 20–29)
Calcium: 9.4 mg/dL (ref 8.6–10.2)
Chloride: 104 mmol/L (ref 96–106)
Creatinine, Ser: 0.92 mg/dL (ref 0.76–1.27)
GFR calc Af Amer: 94 mL/min/{1.73_m2} (ref >59)
GFR calc non Af Amer: 81 mL/min/{1.73_m2} (ref >59)
Glucose: 85 mg/dL (ref 65–99)
Potassium: 4.3 mmol/L (ref 3.5–5.2)
Sodium: 143 mmol/L (ref 134–144)

## 2018-10-20 MED ORDER — SODIUM CHLORIDE 0.9% FLUSH: 3.0000 mL | Freq: Two times a day (BID) | INTRAVENOUS | Status: DC

## 2018-10-22 ENCOUNTER — Other Ambulatory Visit (HOSPITAL_COMMUNITY)
Admission: RE | Admit: 2018-10-22 | Discharge: 2018-10-22 | Disposition: A | Discharge status: Home / Self Care | Payer: Medicare Other | Source: Ambulatory Visit | Attending: Cardiology | Admitting: Cardiology

## 2018-10-22 ENCOUNTER — Other Ambulatory Visit: Payer: Self-pay

## 2018-10-22 DIAGNOSIS — Z01812 Encounter for preprocedural laboratory examination: Secondary | ICD-10-CM | POA: Diagnosis not present

## 2018-10-22 DIAGNOSIS — Z1159 Encounter for screening for other viral diseases: Secondary | ICD-10-CM | POA: Diagnosis not present

## 2018-10-22 NOTE — Progress Notes (Signed)
.  The patient has been notified of the result and verbalized understanding.  All questions (if any) were answered. Troy Banks, [place] 10/22/2018 2:54 PM

## 2018-10-23 ENCOUNTER — Telehealth: Payer: Self-pay | Admitting: *Deleted

## 2018-10-23 LAB — NOVEL CORONAVIRUS, NAA (HOSP ORDER, SEND-OUT TO REF LAB; TAT 18-24 HRS): SARS-CoV-2, NAA: NOT DETECTED

## 2018-10-23 NOTE — Telephone Encounter (Addendum)
Pt contacted pre-catheterization scheduled at [facility] for: Thursday Oct 25, 2018 7:30 AM Verified arrival time and place: [place] Entrance A at: 5:30 AM  At this time, all patients are to wear a mask in the hospital. If you do not have one on arrival, you will be given one.   Covid-19 test date: 10/22/18 not detected  No solid food after midnight prior to cath, clear liquids until 5 AM day of procedure. Contrast allergy: no  Hold: Furosemide-AM of procedure  Except hold medications AM meds can be  taken pre-cath with sip of water including: ASA 81 mg   Confirmed patient has responsible person to drive home post procedure and observe 24 hours after arriving home: yes  Visitors are not allowed in the hospital. You will be dropped off and picked up at [facility] Main Entrance A. Your designated party will be called after your procedure with an update and discharge information.       COVID-19 Pre-Screening Questions:  . In the past 7 to 10 days have you had a cough,  shortness of breath, headache, congestion, fever, body aches, chills, sore throat, or sudden loss of taste or sense of smell? Shortness of breath . Have you been around anyone with known Covid 19? no . Have you been around anyone who is awaiting Covid 19 test results in the past 7 to 10 days? no . Have you been around anyone who has been exposed to Covid 19, or has mentioned symptoms of Covid 19 within the past 7 to 10 days? no  I reviewed procedure instructions/Covid-19 screening questions with patient's wife (verbal permission pt), she verbalized understanding.

## 2018-10-25 ENCOUNTER — Encounter (HOSPITAL_COMMUNITY)
Admission: RE | Disposition: A | Discharge status: Home / Self Care | Payer: Medicare Other | Source: Home / Self Care | Attending: Cardiology

## 2018-10-25 ENCOUNTER — Other Ambulatory Visit: Payer: Self-pay

## 2018-10-25 ENCOUNTER — Ambulatory Visit (HOSPITAL_COMMUNITY)
Admission: RE | Admit: 2018-10-25 | Discharge: 2018-10-25 | Disposition: A | Discharge status: Home / Self Care | Payer: Medicare Other | Attending: Cardiology | Admitting: Cardiology

## 2018-10-25 ENCOUNTER — Encounter (HOSPITAL_COMMUNITY): Payer: Self-pay | Admitting: Cardiology

## 2018-10-25 DIAGNOSIS — Z951 Presence of aortocoronary bypass graft: Secondary | ICD-10-CM | POA: Diagnosis not present

## 2018-10-25 DIAGNOSIS — E039 Hypothyroidism, unspecified: Secondary | ICD-10-CM | POA: Diagnosis not present

## 2018-10-25 DIAGNOSIS — I5032 Chronic diastolic (congestive) heart failure: Secondary | ICD-10-CM | POA: Diagnosis not present

## 2018-10-25 DIAGNOSIS — Z952 Presence of prosthetic heart valve: Secondary | ICD-10-CM

## 2018-10-25 DIAGNOSIS — I4891 Unspecified atrial fibrillation: Secondary | ICD-10-CM | POA: Insufficient documentation

## 2018-10-25 DIAGNOSIS — F4310 Post-traumatic stress disorder, unspecified: Secondary | ICD-10-CM | POA: Insufficient documentation

## 2018-10-25 DIAGNOSIS — I200 Unstable angina: Secondary | ICD-10-CM | POA: Clinically undetermined

## 2018-10-25 DIAGNOSIS — Z953 Presence of xenogenic heart valve: Secondary | ICD-10-CM | POA: Diagnosis not present

## 2018-10-25 DIAGNOSIS — Z79890 Hormone replacement therapy (postmenopausal): Secondary | ICD-10-CM | POA: Insufficient documentation

## 2018-10-25 DIAGNOSIS — M109 Gout, unspecified: Secondary | ICD-10-CM | POA: Insufficient documentation

## 2018-10-25 DIAGNOSIS — Z888 Allergy status to other drugs, medicaments and biological substances status: Secondary | ICD-10-CM | POA: Insufficient documentation

## 2018-10-25 DIAGNOSIS — Z79899 Other long term (current) drug therapy: Secondary | ICD-10-CM | POA: Insufficient documentation

## 2018-10-25 DIAGNOSIS — G4733 Obstructive sleep apnea (adult) (pediatric): Secondary | ICD-10-CM | POA: Insufficient documentation

## 2018-10-25 DIAGNOSIS — I350 Nonrheumatic aortic (valve) stenosis: Secondary | ICD-10-CM | POA: Insufficient documentation

## 2018-10-25 DIAGNOSIS — I252 Old myocardial infarction: Secondary | ICD-10-CM | POA: Diagnosis not present

## 2018-10-25 DIAGNOSIS — E119 Type 2 diabetes mellitus without complications: Secondary | ICD-10-CM | POA: Diagnosis not present

## 2018-10-25 DIAGNOSIS — I25110 Atherosclerotic heart disease of native coronary artery with unstable angina pectoris: Secondary | ICD-10-CM | POA: Diagnosis not present

## 2018-10-25 DIAGNOSIS — R0609 Other forms of dyspnea: Secondary | ICD-10-CM | POA: Diagnosis not present

## 2018-10-25 DIAGNOSIS — E785 Hyperlipidemia, unspecified: Secondary | ICD-10-CM | POA: Insufficient documentation

## 2018-10-25 DIAGNOSIS — E7800 Pure hypercholesterolemia, unspecified: Secondary | ICD-10-CM | POA: Diagnosis not present

## 2018-10-25 DIAGNOSIS — I25118 Atherosclerotic heart disease of native coronary artery with other forms of angina pectoris: Secondary | ICD-10-CM | POA: Diagnosis not present

## 2018-10-25 DIAGNOSIS — Z87891 Personal history of nicotine dependence: Secondary | ICD-10-CM | POA: Diagnosis not present

## 2018-10-25 DIAGNOSIS — Z885 Allergy status to narcotic agent status: Secondary | ICD-10-CM | POA: Insufficient documentation

## 2018-10-25 DIAGNOSIS — Z7982 Long term (current) use of aspirin: Secondary | ICD-10-CM | POA: Diagnosis not present

## 2018-10-25 DIAGNOSIS — M1990 Unspecified osteoarthritis, unspecified site: Secondary | ICD-10-CM | POA: Diagnosis not present

## 2018-10-25 DIAGNOSIS — I25119 Atherosclerotic heart disease of native coronary artery with unspecified angina pectoris: Secondary | ICD-10-CM | POA: Diagnosis present

## 2018-10-25 DIAGNOSIS — K219 Gastro-esophageal reflux disease without esophagitis: Secondary | ICD-10-CM | POA: Diagnosis not present

## 2018-10-25 DIAGNOSIS — I110 Hypertensive heart disease with heart failure: Secondary | ICD-10-CM | POA: Insufficient documentation

## 2018-10-25 HISTORY — PX: LEFT HEART CATH AND CORS/GRAFTS ANGIOGRAPHY: CATH118250

## 2018-10-25 LAB — GLUCOSE, CAPILLARY: Glucose-Capillary: 135 mg/dL — ABNORMAL HIGH (ref 70–99)

## 2018-10-25 SURGERY — LEFT HEART CATH AND CORS/GRAFTS ANGIOGRAPHY
Anesthesia: LOCAL

## 2018-10-25 MED ORDER — SODIUM CHLORIDE 0.9 % IV SOLN: 250.0000 mL | INTRAVENOUS | Status: DC | PRN

## 2018-10-25 MED ORDER — HEPARIN (PORCINE) IN NACL 1000-0.9 UT/500ML-% IV SOLN
INTRAVENOUS | Status: DC | PRN
  Administered 2018-10-25 (×2): 500 mL

## 2018-10-25 MED ORDER — HEPARIN (PORCINE) IN NACL 1000-0.9 UT/500ML-% IV SOLN
INTRAVENOUS | Status: AC
  Filled 2018-10-25: qty 1000

## 2018-10-25 MED ORDER — SODIUM CHLORIDE 0.9 % WEIGHT BASED INFUSION
3.0000 mL/kg/h | INTRAVENOUS | Status: AC
  Administered 2018-10-25: 3 mL/kg/h via INTRAVENOUS

## 2018-10-25 MED ORDER — IOHEXOL 350 MG/ML SOLN
INTRAVENOUS | Status: DC | PRN
  Administered 2018-10-25: 135 mL via INTRA_ARTERIAL

## 2018-10-25 MED ORDER — FENTANYL CITRATE (PF) 100 MCG/2ML IJ SOLN
INTRAMUSCULAR | Status: DC | PRN
  Administered 2018-10-25: 25 ug via INTRAVENOUS

## 2018-10-25 MED ORDER — HYDRALAZINE HCL 20 MG/ML IJ SOLN
INTRAMUSCULAR | Status: AC
  Filled 2018-10-25: qty 1

## 2018-10-25 MED ORDER — SODIUM CHLORIDE 0.9 % IV SOLN: INTRAVENOUS | Status: AC

## 2018-10-25 MED ORDER — ACETAMINOPHEN 325 MG PO TABS: 650.0000 mg | ORAL_TABLET | ORAL | Status: DC | PRN

## 2018-10-25 MED ORDER — FENTANYL CITRATE (PF) 100 MCG/2ML IJ SOLN
INTRAMUSCULAR | Status: AC
  Filled 2018-10-25: qty 2

## 2018-10-25 MED ORDER — LIDOCAINE HCL (PF) 1 % IJ SOLN
INTRAMUSCULAR | Status: AC
  Filled 2018-10-25: qty 30

## 2018-10-25 MED ORDER — SODIUM CHLORIDE 0.9% FLUSH: 3.0000 mL | Freq: Two times a day (BID) | INTRAVENOUS | Status: DC

## 2018-10-25 MED ORDER — MIDAZOLAM HCL 2 MG/2ML IJ SOLN
INTRAMUSCULAR | Status: DC | PRN
  Administered 2018-10-25: 1 mg via INTRAVENOUS

## 2018-10-25 MED ORDER — SODIUM CHLORIDE 0.9 % WEIGHT BASED INFUSION: 1.0000 mL/kg/h | INTRAVENOUS | Status: DC

## 2018-10-25 MED ORDER — HYDRALAZINE HCL 20 MG/ML IJ SOLN
INTRAMUSCULAR | Status: DC | PRN
  Administered 2018-10-25 (×2): 10 mg via INTRAVENOUS

## 2018-10-25 MED ORDER — HYDRALAZINE HCL 20 MG/ML IJ SOLN: 10.0000 mg | INTRAMUSCULAR | Status: DC | PRN

## 2018-10-25 MED ORDER — SODIUM CHLORIDE 0.9% FLUSH: 3.0000 mL | INTRAVENOUS | Status: DC | PRN

## 2018-10-25 MED ORDER — ASPIRIN 81 MG PO CHEW
81.0000 mg | CHEWABLE_TABLET | ORAL | Status: AC
  Administered 2018-10-25: 81 mg via ORAL
  Filled 2018-10-25: qty 1

## 2018-10-25 MED ORDER — MIDAZOLAM HCL 2 MG/2ML IJ SOLN
INTRAMUSCULAR | Status: AC
  Filled 2018-10-25: qty 2

## 2018-10-25 MED ORDER — LIDOCAINE HCL (PF) 1 % IJ SOLN
INTRAMUSCULAR | Status: DC | PRN
  Administered 2018-10-25: 18 mL

## 2018-10-25 SURGICAL SUPPLY — 13 items
CATH INFINITI 5 FR AR1 MOD (CATHETERS) ×1 IMPLANT
CATH INFINITI 5 FR IM (CATHETERS) ×1 IMPLANT
CATH INFINITI 5FR MULTPACK ANG (CATHETERS) ×1 IMPLANT
KIT HEART LEFT (KITS) ×2 IMPLANT
PACK CARDIAC CATHETERIZATION (CUSTOM PROCEDURE TRAY) ×2 IMPLANT
SHEATH PINNACLE 5F 10CM (SHEATH) ×1 IMPLANT
SHEATH PROBE COVER 6X72 (BAG) ×1 IMPLANT
TRANSDUCER W/STOPCOCK (MISCELLANEOUS) ×2 IMPLANT
TUBING CIL FLEX 10 FLL-RA (TUBING) ×2 IMPLANT
WIRE EMERALD 3MM-J .035X150CM (WIRE) ×1 IMPLANT
WIRE EMERALD 3MM-J .035X260CM (WIRE) ×1 IMPLANT
WIRE EMERALD ST .035X260CM (WIRE) ×1 IMPLANT
WIRE HI TORQ VERSACORE-J 145CM (WIRE) ×1 IMPLANT

## 2018-10-25 NOTE — Interval H&P Note (Signed)
History and Physical Interval Note:  10/25/2018 7:24 AM  Troy Banks  has presented today for surgery, with the diagnosis of dyspnea on exertion / Class II-III Angina equivalent.  The various methods of treatment have been discussed with the patient and family. After consideration of risks, benefits and other options for treatment, the patient has consented to  Procedure(s): LEFT HEART CATH AND CORS/GRAFTS ANGIOGRAPHY (N/A) with possible PERCUTANEOUS CORONARY INTERVENTION as a surgical intervention.  The patient's history has been reviewed, patient examined, no change in status, stable for surgery.  I have reviewed the patient's chart and labs.  Questions were answered to the patient's satisfaction.    Cath Lab Visit (complete for each Cath Lab visit)  Clinical Evaluation Leading to the Procedure:   ACS: No.  Non-ACS:    Anginal Classification: CCS III  Anti-ischemic medical therapy: Minimal Therapy (1 class of medications)  Non-Invasive Test Results: No non-invasive testing performed  Prior CABG: Previous CABG  Glenetta Hew

## 2018-10-25 NOTE — Progress Notes (Signed)
The patient has been notified of the result and verbalized understanding.  All questions (if any) were answered. [name], [place] 10/25/2018 10:25 AM

## 2018-10-25 NOTE — Discharge Instructions (Signed)
DRINK PLENTY OF FLUIDS FOR THE NEXT 2-3 DAYS TO KEEP HYDRATED.  Femoral Site Care This sheet gives you information about how to care for yourself after your procedure. Your health care provider may also give you more specific instructions. If you have problems or questions, contact your health care provider. What can I expect after the procedure? After the procedure, it is common to have:  Bruising that usually fades within 1-2 weeks.  Tenderness at the site. Follow these instructions at home: Wound care  Follow instructions from your health care provider about how to take care of your insertion site. Make sure you: ? Wash your hands with soap and water before you change your bandage (dressing). If soap and water are not available, use hand sanitizer. ? Change your dressing as told by your health care provider.  Do not take baths, swim, or use a hot tub for 5 days.  You may shower 24-48 hours after the procedure or as told by your health care provider. ? Gently wash the site with plain soap and water. ? Pat the area dry with a clean towel. ? Do not rub the site. This may cause bleeding.  Do not apply powder or lotion to the site. Keep the site clean and dry.  Check your femoral site every day for signs of infection. Check for: ? Redness, swelling, or pain. ? Fluid or blood. ? Warmth. ? Pus or a bad smell. Activity  For the first 2-3 days after your procedure, or as long as directed: ? Avoid climbing stairs as much as possible. ? Do not squat.  Do not lift anything that is heavier than 10 lb for 5 days.  Rest as directed. ? Avoid sitting for a long time without moving. Get up to take short walks every 1-2 hours.  Do not drive for 24 hours if you were given a medicine to help you relax (sedative). General instructions  Take over-the-counter and prescription medicines only as told by your health care provider.  Keep all follow-up visits as told by your health care  provider. This is important. Contact a health care provider if you have:  A fever or chills.  You have redness, swelling, or pain around your insertion site. Get help right away if:  The catheter insertion area swells very fast.  You pass out.  You suddenly start to sweat or your skin gets clammy.  The catheter insertion area is bleeding, and the bleeding does not stop when you hold steady pressure on the area.  The area near or just beyond the catheter insertion site becomes pale, cool, tingly, or numb. These symptoms may represent a serious problem that is an emergency. Do not wait to see if the symptoms will go away. Get medical help right away. Call your local emergency services (911 in the [place] Do not drive yourself to the hospital. Summary  After the procedure, it is common to have bruising that usually fades within 1-2 weeks.  Check your femoral site every day for signs of infection.  Do not lift anything that is heavier than 10 lb (4.5 kg), or the limit that you are told, until your health care provider says that it is safe. This information is not intended to replace advice given to you by your health care provider. Make sure you discuss any questions you have with your health care provider. Document Released: 01/24/2014 Document Revised: 06/05/2017 Document Reviewed: 06/05/2017 Elsevier Interactive Patient Education  2019 [employer].

## 2018-10-25 NOTE — Progress Notes (Signed)
Site area: Right groin 5 french arterial sheath was removed  Site Prior to Removal:  Level 0  Pressure Applied For 20 MINUTES    Bedrest Beginning at 0925am  Manual:   Yes.    Patient Status During Pull:  stable  Post Pull Groin Site:  Level 0  Post Pull Instructions Given:  Yes.    Post Pull Pulses Present:  Yes.    Dressing Applied:  Yes.    Comments:  stable

## 2018-10-26 ENCOUNTER — Telehealth: Payer: Self-pay | Admitting: Cardiology

## 2018-10-26 ENCOUNTER — Telehealth: Payer: Self-pay

## 2018-10-26 NOTE — Telephone Encounter (Signed)
Pt admitted for surgery: LEFT HEART CATH AND CORS/GRAFTS ANGIOGRAPHY  Pt to follow up with cardiology.

## 2018-10-26 NOTE — Telephone Encounter (Signed)
Karmen Bongo caled on behalf of the patient. The patient had a Heart Cath done yesterday, and [name] was told that he would need a 2D Echo scheduled soon.  There are no orders in the chart.  Please verify  with Dr. [name].

## 2018-10-26 NOTE — Telephone Encounter (Signed)
Spoke with pt's friend per DPR who report pt had a Cath yesterday and recommendation were to have an ECHO done.  Per Dr. Allison Quarry notes,  SUMMARY  Severe native CAD essentially unchanged from post CABG with exception of now 100% occluded RCA.  4 out of 4 patent grafts: LIMA-LAD, SVG-RI (D1), SVG-dCx, SVG-D RCA  Systemic Hypertension  Angiographically small aortic root/stent prosthetic valve.  Unable to cross.   RECOMMENDATIONS  Discharge home after bedrest and hydration  Consider 2D echocardiogram to assess for restenosis of aortic valve  Pending echocardiographic evaluation, would consider non-anginal etiology -> with hypertension, could be related to diastolic dysfunction.  Will route to PA for the ok to order. Friend also questioning if f/u appointment not be in office.  Message routed

## 2018-11-01 ENCOUNTER — Other Ambulatory Visit: Payer: Self-pay

## 2018-11-01 ENCOUNTER — Telehealth: Payer: Self-pay | Admitting: Cardiology

## 2018-11-01 DIAGNOSIS — I350 Nonrheumatic aortic (valve) stenosis: Secondary | ICD-10-CM

## 2018-11-01 NOTE — Telephone Encounter (Signed)
Discussed with Troy Banks, complete echo to evaluate aortic stenosis ordered.

## 2018-11-01 NOTE — Telephone Encounter (Signed)
Order for ECHO was placed and I called and spoke with Karmen Bongo, who is on the patient's DPR that the Echocardiogram order was placed and to expect a call from the scheduling to get Troy Banks scheduled for his ECHO. She verbalized an understanding and all (if any) questions were answered.

## 2018-11-01 NOTE — Telephone Encounter (Signed)
  [name] is calling to see what is going on in regards to patient getting a Echo. It was discussed with the PA for the patient to do an echo but there is no order put in for it. They would like to get this done.

## 2018-11-01 NOTE — Telephone Encounter (Signed)
ECHO order placed, Hao's assistant will call pt to discuss

## 2018-11-01 NOTE — Progress Notes (Signed)
Verbal orders given per Isaac Laud

## 2018-11-02 ENCOUNTER — Ambulatory Visit (INDEPENDENT_AMBULATORY_CARE_PROVIDER_SITE_OTHER): Payer: Medicare Other | Admitting: Internal Medicine

## 2018-11-02 ENCOUNTER — Other Ambulatory Visit (INDEPENDENT_AMBULATORY_CARE_PROVIDER_SITE_OTHER): Payer: Medicare Other

## 2018-11-02 ENCOUNTER — Other Ambulatory Visit: Payer: Self-pay

## 2018-11-02 ENCOUNTER — Encounter: Payer: Self-pay | Admitting: Internal Medicine

## 2018-11-02 VITALS — BP 154/90 | HR 61 | Temp 98.2°F | Ht 68.0 in | Wt 209.0 lb

## 2018-11-02 DIAGNOSIS — E538 Deficiency of other specified B group vitamins: Secondary | ICD-10-CM

## 2018-11-02 DIAGNOSIS — E611 Iron deficiency: Secondary | ICD-10-CM

## 2018-11-02 DIAGNOSIS — E559 Vitamin D deficiency, unspecified: Secondary | ICD-10-CM

## 2018-11-02 DIAGNOSIS — E119 Type 2 diabetes mellitus without complications: Secondary | ICD-10-CM | POA: Diagnosis not present

## 2018-11-02 DIAGNOSIS — Z0000 Encounter for general adult medical examination without abnormal findings: Secondary | ICD-10-CM | POA: Diagnosis not present

## 2018-11-02 DIAGNOSIS — I200 Unstable angina: Secondary | ICD-10-CM | POA: Diagnosis not present

## 2018-11-02 DIAGNOSIS — Z23 Encounter for immunization: Secondary | ICD-10-CM | POA: Diagnosis not present

## 2018-11-02 DIAGNOSIS — Z0001 Encounter for general adult medical examination with abnormal findings: Secondary | ICD-10-CM

## 2018-11-02 DIAGNOSIS — I10 Essential (primary) hypertension: Secondary | ICD-10-CM

## 2018-11-02 DIAGNOSIS — K439 Ventral hernia without obstruction or gangrene: Secondary | ICD-10-CM | POA: Diagnosis not present

## 2018-11-02 LAB — LIPID PANEL
Cholesterol: 129 mg/dL (ref 0–200)
HDL: 44.5 mg/dL (ref >39.00)
LDL Cholesterol: 65 mg/dL (ref 0–99)
NonHDL: 84.02
Total CHOL/HDL Ratio: 3
Triglycerides: 97 mg/dL (ref 0.0–149.0)
VLDL: 19.4 mg/dL (ref 0.0–40.0)

## 2018-11-02 LAB — VITAMIN D 25 HYDROXY (VIT D DEFICIENCY, FRACTURES): VITD: 65.73 ng/mL (ref 30.00–100.00)

## 2018-11-02 LAB — HEMOGLOBIN A1C: Hgb A1c MFr Bld: 5.9 % (ref 4.6–6.5)

## 2018-11-02 LAB — IBC PANEL
Iron: 101 ug/dL (ref 42–165)
Saturation Ratios: 32.4 % (ref 20.0–50.0)
Transferrin: 223 mg/dL (ref 212.0–360.0)

## 2018-11-02 LAB — TSH: TSH: 4.75 u[IU]/mL — ABNORMAL HIGH (ref 0.35–4.50)

## 2018-11-02 LAB — PSA: PSA: 1.39 ng/mL (ref 0.10–4.00)

## 2018-11-02 LAB — VITAMIN B12: Vitamin B-12: >1500 pg/mL — ABNORMAL HIGH (ref 211–911)

## 2018-11-02 NOTE — Patient Instructions (Signed)
You had the Tdap tetanus shot today  Please continue all other medications as before, and refills have been done if requested.  Please have the pharmacy call with any other refills you may need.  Please continue your efforts at being more active, low cholesterol diet, and weight control.  You are otherwise up to date with prevention measures today.  Please keep your appointments with your specialists as you may have planned  Please go to the LAB in the Basement (turn left off the elevator) for the tests to be done today  You will be contacted by phone if any changes need to be made immediately.  Otherwise, you will receive a letter about your results with an explanation, but please check with MyChart first.  Please remember to sign up for MyChart if you have not done so, as this will be important to you in the future with finding out test results, communicating by private email, and scheduling acute appointments online when needed.  Please return in 6 months, or sooner if needed 

## 2018-11-02 NOTE — Progress Notes (Signed)
Subjective:    Patient ID: Troy Banks, male    DOB: 08-18-1940, 78 y.o.   MRN: [id number]  HPI  Here for wellness and f/u;  Overall doing ok;  Pt denies Chest pain, worsening SOB, DOE, wheezing, orthopnea, PND, worsening LE edema, palpitations, dizziness or syncope.  Pt denies neurological change such as new headache, facial or extremity weakness.  Pt denies polydipsia, polyuria, or low sugar symptoms. Pt states overall good compliance with treatment and medications, good tolerability, and has been trying to follow appropriate diet.  Pt denies worsening depressive symptoms, suicidal ideation or panic. No fever, night sweats, wt loss, loss of appetite, or other constitutional symptoms.  Pt states good ability with ADL's, has low fall risk, home safety reviewed and adequate, no other significant changes in hearing or vision, and only occasionally active with exercise. 2D echo to be scheduled. Missed feb 2020 eye exam at the [facility] but rescheduled with private optho July 8 - Dr Eulas Post.   Also with an issue of left abd wall herniation x > 6 mo, first noted at the [place], worsening in size but no pain, and Denies worsening reflux, abd pain, dysphagia, n/v, bowel change or blood. Does also state that prior to recent cath had some SSCP after walked the dog 3/4 mile after got back to home, lasted few min,  none since (but hasnt walked the dog either).   Past Medical History:  Diagnosis Date  . Allergy   . Anxiety    PTSD  . Aortic stenosis, moderate 03/27/2015   post AVR echo Echo 11/16: Mild LVH, EF 55-60%, normal wall motion, grade 1 diastolic dysfunction, bioprosthetic AVR okay (mean gradient 18 mmHg) with trivial AI, mild LAE, atrial septal aneurysm, small effusion   . Arthritis    "qwhere" (10/29/2015)  . Carotid artery stenosis    Carotid US 11/17: 1-39% bilateral ICA stenosis. F/u prn  . Cervical stenosis of spinal canal    fusion 2006  . CHF (congestive heart failure) ([employer])    "secondry to [facility]"   . Closed left pilon fracture   . COLONIC POLYPS, HX OF   . Complication of anesthesia    "takes a lot to put him under and has woken up during surgery" Difficult to wake up . PTSD; "does not metabolize RX well"Pt. reports that he gets "violent", per pt.   . Diabetes mellitus without complication (HCC)    diet controlled  . DIVERTICULOSIS, COLON   . Dysrhythmia    2 bouts of afib post op and at home but corrected with medication.  . Familial tremor 03/19/2014  . GERD (gastroesophageal reflux disease)    uses Zantac  . GOUT   . Headache   . Heart murmur   . High cholesterol   . HYPERTENSION    off ACEI 2010 because of hyperkalemia in setting of ARI  . Hypothyroidism    newly diaganosed 09/2015  . Kidney stones    remote open stone-retrieval on L  . Myocardial infarction [facility])    October 20th 2016  . OSA on CPAP   . 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 PTSD (post-traumatic stress disorder)   . Sleep apnea    On a CPAP   Past Surgical History:  Procedure Laterality Date  . ANKLE FUSION Left 03/17/2017   Procedure: FUSION  PILON FRACTURE WITH BONE GRAFTING;  Surgeon: Shona Needles, MD;  Location:  [place] [facility];  Service: Orthopedics;  Laterality: Left;  . ANTERIOR FUSION CERVICAL SPINE  2002  . AORTIC VALVE REPLACEMENT N/A 03/30/2015   Procedure: AORTIC VALVE REPLACEMENT (AVR);  Surgeon: Grace Isaac, MD;  Location: [place];  Service: Open Heart Surgery;  Laterality: N/A;  . BACK SURGERY    . CARDIAC CATHETERIZATION N/A 03/26/2015   Procedure: Left Heart Cath and Coronary Angiography;  Surgeon: Leonie Man, MD;  Location: [place] [facility];  Service: Cardiovascular;  Laterality: N/A;  . CARDIAC VALVE REPLACEMENT    . CORONARY ARTERY BYPASS GRAFT N/A 03/30/2015   Procedure: CORONARY ARTERY BYPASS GRAFTING (CABG) x four, using left internal mammary artery and left    leg greater saphenous vein harvested  endoscopically ;  Surgeon: Grace Isaac, MD;  Location: [place];  Service: Open Heart Surgery;  Laterality: N/A;  . CYSTOSCOPY/URETEROSCOPY/HOLMIUM LASER/STENT PLACEMENT Bilateral 11/07/2016   Procedure: CYSTOSCOPY/URETEROSCOPY/HOLMIUM LASER/STENT PLACEMENT;  Surgeon: Nickie Retort, MD;  Location: [facility];  Service: Urology;  Laterality: Bilateral;  . CYSTOSCOPY/URETEROSCOPY/HOLMIUM LASER/STENT PLACEMENT Bilateral 11/23/2016   Procedure: CYSTOSCOPY/BILATERAL URETEROSCOPY/HOLMIUM LASER/STENT EXCHANGE;  Surgeon: Nickie Retort, MD;  Location: [facility];  Service: Urology;  Laterality: Bilateral;  NEEDS 90 MIN   . EXTERNAL FIXATION LEG Left 02/22/2017   Procedure: EXTERNAL FIXATION LEFT ANKLE;  Surgeon: Renette Butters, MD;  Location: [place];  Service: Orthopedics;  Laterality: Left;  . HEMORRHOID BANDING  1970s  . INGUINAL HERNIA REPAIR Left 1962  . JOINT REPLACEMENT    . [place]   "cut me open"  . LEFT HEART CATH AND CORS/GRAFTS ANGIOGRAPHY N/A 10/25/2018   Procedure: LEFT HEART CATH AND CORS/GRAFTS ANGIOGRAPHY;  Surgeon: Leonie Man, MD;  Location: [place] [facility];  Service: Cardiovascular;  Laterality: N/A;  . POSTERIOR FUSION CERVICAL SPINE  2006  . TEE WITHOUT CARDIOVERSION N/A 03/30/2015   Procedure: TRANSESOPHAGEAL ECHOCARDIOGRAM (TEE);  Surgeon: Grace Isaac, MD;  Location: [place];  Service: Open Heart Surgery;  Laterality: N/A;  . TESTICLE TORSION REDUCTION Right 1960  . TOE FUSION Left 1985 & 2004   "great toe"  . TONSILLECTOMY    . TOTAL HIP ARTHROPLASTY Right 10/27/2015   Procedure: RIGHT TOTAL HIP ARTHROPLASTY ANTERIOR APPROACH and steroid injection right foot;  Surgeon: Mcarthur Rossetti, MD;  Location: [place];  Service: Orthopedics;  Laterality: Right;    reports that he quit smoking about 28 years ago. His smoking use included cigarettes. He has a 34.00 pack-year smoking history. He has never used smokeless tobacco. He reports current  alcohol use of about 1.0 standard drinks of alcohol per week. He reports that he does not use drugs. family history includes Alcohol abuse in an other family member; Arthritis in an other family member; Diabetes in his mother and paternal grandfather; Heart disease in his father, maternal grandfather, maternal grandmother, mother, and paternal grandfather; Hypertension in his father, maternal grandfather, maternal grandmother, mother, and paternal grandfather; Prostate cancer in his maternal uncle. Allergies  Allergen Reactions  . Benzodiazepines Other (See Comments)    Patient states he gets stroke symptoms with benzo's.   . Coreg [Carvedilol] Other (See Comments)    Accumulates and causes stroke-like side-effects due to 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 Patient poor metabolizer of CYP2D6 - Coreg undergoes extensive hepatic (including 2D6)  . Lisinopril Other (See Comments)    Hyperkalemia. 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  . Mobic [Meloxicam] Other (See Comments)  Accumulates and causes stroke-like side-effects due to 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  . Sertraline Hcl Other (See Comments)    Accumulates and causes stroke-like side-effects due to 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  . Tramadol Other (See Comments)    Accumulates and causes stroke-like side-effects due to 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  . Atorvastatin Other (See Comments)    MYALGIAS  . Colchicine Other (See Comments)    ataxia  . Compazine [Prochlorperazine Maleate] Other (See Comments)    Accumulates and causes stroke-like side-effects due to 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  . Flomax [Tamsulosin Hcl]     Accumulates and causes stroke-like side-effects due to 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  . Ondansetron Other (See Comments)    Accumulates and causes stroke-like side-effects due to 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   . Promethazine Other (See Comments)     Accumulates and causes stroke-like side-effects due to 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  . Acyclovir And Related Other (See Comments)    Accumulates and causes stroke-like side-effects due to 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   Current Outpatient Medications on File Prior to Visit  Medication Sig Dispense Refill  . acetaminophen (TYLENOL) 500 MG tablet Take 2 tablets (1,000 mg total) by mouth every 6 (six) hours. 90 tablet 2  . allopurinol (ZYLOPRIM) 300 MG tablet TAKE 1 TABLET BY MOUTH EVERY DAY (Patient taking differently: Take 300 mg by mouth daily. ) 90 tablet 1  . amLODipine (NORVASC) 10 MG tablet TAKE 1 TABLET BY MOUTH EVERY DAY (Patient taking differently: Take 10 mg by mouth every evening. ) 90 tablet 0  . Ascorbic Acid (VITAMIN C) 1000 MG tablet Take 1,000 mg by mouth 2 (two) times daily.     [name] aspirin 81 MG chewable tablet Chew 81 mg by mouth every evening.     . Carboxymethylcellulose Sodium (LUBRICANT EYE DROPS OP) Place 1 drop into both eyes 2 (two) times daily as needed (dry/irritated eyes.).     [name] Cholecalciferol (VITAMIN D-3) 125 MCG (5000 UT) TABS Take 5,000 Units by mouth 2 (two) times a day.    . furosemide (LASIX) 40 MG tablet TAKE 1 TABLET BY MOUTH EVERY OTHER DAY (Patient taking differently: Take 40 mg by mouth every other day. IN THE MORNING.) 45 tablet 1  . ibuprofen (ADVIL) 200 MG tablet Take 400 mg by mouth every 8 (eight) hours as needed (for pain.).    [name] levothyroxine (SYNTHROID) 25 MCG tablet TAKE 1 TABLET BY MOUTH EVERY DAY BEFORE BREAKFAST (Patient taking differently: Take 25 mcg by mouth daily before breakfast. ) 90 tablet 0  . nitroGLYCERIN (NITROSTAT) 0.4 MG SL tablet Place 1 tablet (0.4 mg total) under the tongue every 5 (five) minutes as needed for chest pain. 25 tablet 3  . polyethylene glycol (MIRALAX / GLYCOLAX) packet Take 17 g by mouth daily. (Patient taking differently: Take 17 g by mouth daily as needed (constipation.). ) 14 each 0  . primidone  (MYSOLINE) 50 MG tablet Take 3 tablets (150 mg total) by mouth 2 (two) times daily. (Patient taking differently: Take 150-200 mg by mouth See admin instructions. TAKE 4 TABLETS (200 MG) BY MOUTH IN THE MORNING & 3 TABLET (150 MG) BY MOUTH AT NIGHT.) 540 tablet 1  . ranitidine (ZANTAC) 150 MG tablet TAKE 1 TABLET BY MOUTH 2 TIMES DAILY (Patient taking differently: Take 150 mg by mouth 2 (two) times a day. ) 180 tablet 0  . rosuvastatin (CRESTOR) 10 MG tablet Take 0.5 tablets (5 mg total) by mouth daily. (  Patient taking differently: Take 5 mg by mouth every evening. ) 45 tablet 1  . tiZANidine (ZANAFLEX) 4 MG tablet Take 1 tablet (4 mg total) by mouth every 8 (eight) hours as needed for muscle spasms. 60 tablet 0  . Turmeric 500 MG CAPS Take 500 mg by mouth 2 (two) times daily.     . vitamin B-12 (CYANOCOBALAMIN) 1000 MCG tablet Take 1,000 mcg by mouth 2 (two) times daily.      Current Facility-Administered Medications on File Prior to Visit  Medication Dose Route Frequency Provider Last Rate Last Dose  . sodium chloride flush (NS) 0.9 % injection 3 mL  3 mL Intravenous Q12H Almyra Deforest, PA       Review of Systems Constitutional: Negative for other unusual diaphoresis, sweats, appetite or weight changes HENT: Negative for other worsening hearing loss, ear pain, facial swelling, mouth sores or neck stiffness.   Eyes: Negative for other worsening pain, redness or other visual disturbance.  Respiratory: Negative for other stridor or swelling Cardiovascular: Negative for other palpitations or other chest pain  Gastrointestinal: Negative for worsening diarrhea or loose stools, blood in stool, distention or other pain Genitourinary: Negative for hematuria, flank pain or other change in urine volume.  Musculoskeletal: Negative for myalgias or other joint swelling.  Skin: Negative for other color change, or other wound or worsening drainage.  Neurological: Negative for other syncope or numbness.  Hematological: Negative for other adenopathy or swelling Psychiatric/Behavioral: Negative for hallucinations, other worsening agitation, SI, self-injury, or new decreased concentration \\All  other system neg per pt    Objective:   Physical Exam BP (!) 154/90   Pulse 61   Temp 98.2 F (36.8 C) (Oral)   Ht 5\' 8"  (1.727 m)   Wt 209 lb (94.8 kg)   SpO2 97%   BMI 31.78 kg/m   VS noted,  Constitutional: Pt is oriented to person, place, and time. Appears well-developed and well-nourished, in no significant distress and comfortable Head: Normocephalic and atraumatic  Eyes: Conjunctivae and EOM are normal. Pupils are equal, round, and reactive to light Right Ear: External ear normal without discharge Left Ear: External ear normal without discharge Nose: Nose without discharge or deformity Mouth/Throat: Oropharynx is without other ulcerations and moist  Neck: Normal range of motion. Neck supple. No JVD present. No tracheal deviation present or significant neck LA or mass Cardiovascular: Normal rate, regular rhythm, normal heart sounds and intact distal pulses.   Pulmonary/Chest: WOB normal and breath sounds without rales or wheezing  Abdominal: Soft. Bowel sounds are normal. NT. No HSM  Musculoskeletal: Normal range of motion. Exhibits no edema Lymphadenopathy: Has no other cervical adenopathy.  Neurological: Pt is alert and oriented to person, place, and time. Pt has normal reflexes. No cranial nerve deficit. Motor grossly intact, Gait intact Skin: Skin is warm and dry. No rash noted or new ulcerations Psychiatric:  Has normal mood and affect. Behavior is normal without agitation No other exam findings Lab Results  Component Value Date   WBC 6.0 10/19/2018   HGB 15.9 10/19/2018   HCT 46.9 10/19/2018   PLT 146 (L) 10/19/2018   GLUCOSE 85 10/19/2018   CHOL 129 11/02/2018   TRIG 97.0 11/02/2018   HDL 44.50 11/02/2018   LDLDIRECT 100.0 09/08/2014   LDLCALC 65 11/02/2018   ALT 12  10/31/2017   AST 14 10/31/2017   NA 143 10/19/2018   K 4.3 10/19/2018   CL 104 10/19/2018   CREATININE 0.92 10/19/2018   BUN  17 10/19/2018   CO2 23 10/19/2018   TSH 4.75 (H) 11/02/2018   PSA 1.39 11/02/2018   INR 1.47 03/30/2015   HGBA1C 5.9 11/02/2018   MICROALBUR 4.3 (H) 10/31/2017      Assessment & Plan:

## 2018-11-03 ENCOUNTER — Encounter: Payer: Self-pay | Admitting: Internal Medicine

## 2018-11-03 NOTE — Assessment & Plan Note (Signed)
stable overall by history and exam, recent data reviewed with pt, and pt to continue medical treatment as before,  to f/u any worsening symptoms or concerns  

## 2018-11-03 NOTE — Assessment & Plan Note (Signed)
Moderate sized, non discrete but probably left mid or LLQ location, nontender, pt reassured, no specific tx needed at this time

## 2018-11-03 NOTE — Assessment & Plan Note (Signed)

## 2018-11-03 NOTE — Assessment & Plan Note (Addendum)
Stable, to f/u cardiology as planned with echo, cont same tx  In addition to the time spent performing CPE, I spent an additional 25 minutes face to face,in which greater than 50% of this time was spent in counseling and coordination of care for patient's acute illness as documented, including the differential dx, treatment, further evaluation and other management of chest pain, DM, abd wall hernia, HTN

## 2018-11-19 ENCOUNTER — Other Ambulatory Visit: Payer: Self-pay | Admitting: Internal Medicine

## 2018-11-19 ENCOUNTER — Telehealth: Payer: Self-pay | Admitting: Neurology

## 2018-11-19 ENCOUNTER — Telehealth (HOSPITAL_COMMUNITY): Payer: Self-pay | Admitting: *Deleted

## 2018-11-19 ENCOUNTER — Other Ambulatory Visit: Payer: Self-pay | Admitting: Physician Assistant

## 2018-11-19 MED ORDER — PRIMIDONE 50 MG PO TABS: ORAL_TABLET | ORAL | 1 refills | Status: DC

## 2018-11-19 NOTE — Telephone Encounter (Signed)
Yes

## 2018-11-19 NOTE — Telephone Encounter (Signed)
Requested Prescriptions   Pending Prescriptions Disp Refills  . primidone (MYSOLINE) 50 MG tablet      Sig: TAKE 4 TABLETS (200 MG) BY MOUTH IN THE MORNING & 3 TABLET (150 MG) BY MOUTH AT NIGHT.   Rx sent

## 2018-11-19 NOTE — Telephone Encounter (Signed)
Ok to send? Troy Clarks, DO to Driver, Aileen Pilot, LPN     06/07/20 41:14 AM Note   He is currently on 3 in the AM, 3 at night.  He can now take 4 in the AM, 3 at night.

## 2018-11-19 NOTE — Telephone Encounter (Signed)
Primidone- new prescription with updated instructions: he is taking 50mg  4 in AM and 3 in PM. Please fax this in. Thanks!

## 2018-11-19 NOTE — Telephone Encounter (Signed)
Last office visit from 07/25/18 Assessment/Plan:   1.  Essential Tremor.             -This is evidenced by the symmetrical nature and longstanding hx of gradually getting worse and family hx.             -increase primidone to 50mg , 3 in the am, 3 in the pm.   Previously contacted pharmacy previously due to his hx of cytochrome P450 problems but told that no interaction and not an inducer of this system.  Had increased tremor previously with amiodarone.    Risks, benefits, side effects and alternative therapies were discussed.  The opportunity to ask questions was given and they were answered to the best of my ability.  The patient expressed understanding and willingness to follow the outlined treatment protocols.   Please advise  Ok to send per patient request?

## 2018-11-19 NOTE — Telephone Encounter (Signed)
COVID-19 Pre-Screening Questions:  . Do you currently have a fever? NO (yes = cancel and refer to pcp for e-visit) . Have you recently travelled on a cruise, internationally, or to [place], [place], [place], [place], [place], or [place], [place] ([employer]) ? NO (yes = cancel, stay home, monitor symptoms, and contact pcp or initiate e-visit if symptoms develop) . Have you been in contact with someone that is currently pending confirmation of Covid19 testing or has been confirmed to have the Covid19 virus?  NO (yes = cancel, stay home, away from tested individual, monitor symptoms, and contact pcp or initiate e-visit if symptoms develop) . Are you currently experiencing fatigue or cough? NO (yes = pt should be prepared to have a mask placed at the time of their visit).   . Reiterated no additional visitors. . Arrive no earlier than 15 minutes before appointment time. . Please bring own mask.  Troy Banks 

## 2018-11-20 ENCOUNTER — Ambulatory Visit (HOSPITAL_COMMUNITY): Payer: Medicare Other | Attending: Cardiology

## 2018-11-20 ENCOUNTER — Other Ambulatory Visit: Payer: Self-pay

## 2018-11-20 DIAGNOSIS — I350 Nonrheumatic aortic (valve) stenosis: Secondary | ICD-10-CM | POA: Diagnosis not present

## 2018-11-22 ENCOUNTER — Other Ambulatory Visit: Payer: Self-pay | Admitting: Physician Assistant

## 2018-11-22 ENCOUNTER — Telehealth: Payer: Self-pay | Admitting: Physician Assistant

## 2018-11-22 DIAGNOSIS — R079 Chest pain, unspecified: Secondary | ICD-10-CM

## 2018-11-22 MED ORDER — FUROSEMIDE 40 MG PO TABS: 60.0000 mg | ORAL_TABLET | ORAL | 1 refills | Status: DC

## 2018-11-22 NOTE — Telephone Encounter (Addendum)
Recent lab work does not explain his symptom of chest tightness and fatigue. Although TSH is elevated, it is only borderline. The possible causes involve either uncontrolled BP vs volume overload vs microvascular angina. Recent cath showed stable coronary anatomy. Given the borderline elevated RV pressure, I recommended increase his every other day of 40mg  lasix to 60mg  every other day. He will need a BMET on 7/1 since he won't be able to start on his higher dose of diuretic until he returns back to [facility] after 6/23. For his BP, his SBP has been running around 140 mmHg, we may try low dose (1 or 2mg ) cardura again in the future if his BP does not come down. Treating microvascular angina would be more problematic since he is already on max dose of amlodipine and there is a question whether he can tolerate Imdur with Cytochrome P450 deficiency. For now I will arrange office visit with Dr. [name], MD only.   Addendum: although wife mentions he has tried cardura in the 1990s without obvious side effect, however flomax is currently listed as allergy with result stroke like symptom related to P450 deficiency, it is questionable if he can take cardura again

## 2018-11-23 NOTE — Telephone Encounter (Signed)
Spoke to patient's wife Dr.Jordan advised to check B/P daily for the next 2 weeks.Appointment scheduled with Dr.Jordan 7/9 at 10:40 am.Advised to bring readings to appointment.

## 2018-11-26 ENCOUNTER — Other Ambulatory Visit: Payer: Self-pay

## 2018-12-03 ENCOUNTER — Other Ambulatory Visit: Payer: Self-pay | Admitting: Internal Medicine

## 2018-12-03 ENCOUNTER — Other Ambulatory Visit: Payer: Self-pay | Admitting: Neurology

## 2018-12-03 NOTE — Telephone Encounter (Signed)
Noted  

## 2018-12-03 NOTE — Telephone Encounter (Signed)
Since the med has been ordered I dont think any more needs to be done, thanks

## 2018-12-03 NOTE — Telephone Encounter (Signed)
Requested Prescriptions   Pending Prescriptions Disp Refills  . primidone (MYSOLINE) 50 MG tablet [Pharmacy Med Name: primidone 50 mg tablet] 540 tablet 1    Sig: Take 3 tablets (150 mg total) by mouth 2 (two) times daily.   Rx last filled:11/19/18 #540 1 refills  Pt last seen:07/25/18  Follow up appt scheduled:01/24/19

## 2018-12-03 NOTE — Telephone Encounter (Signed)
Spoke to pt's wife, she stated that she had already spoke with Juliann Pulse (pharmacist) and it wasn't on recall after repeatedly cutting me off while I explained to her what we had received from the pt's pharmacy. I informed her we recieved a refill request for the Zantac but it was asking for an alternative to be sent in due to recall. Pt's wife was unpleasant during this exchange and stated that she will follow up with [place] to provide Kathy's alternative recommendation.

## 2018-12-03 NOTE — Telephone Encounter (Signed)
Ok to let pt know we had to change the zantac to pepcid due to zantac no longer available

## 2018-12-03 NOTE — Telephone Encounter (Signed)
Per pharmacy this is on recall, can you please call in an alternative. Thanks!

## 2018-12-09 NOTE — Progress Notes (Signed)
12/13/2018 Troy Banks   04/19/1941  [id number]  Primary Physician [name], MD Primary Cardiologist: Dr Isola Mehlman [name]  HPI:  78 y.o. Male seen for follow up CAD. He was admitted 10/19-10/31/16 with a non-STEMI. LHC demonstrated 3 vessel CAD. He also had significant aortic stenosis.  He underwent CABG with LIMA-LAD, SVG-intermediate, SVG-LCx, SVG-distal RCA and bioprosthetic AVR. Atenolol was initiated but later stopped secondary to bradycardia. He did develop atrial fibrillation. He was placed on amiodarone with return to NSR. He has maintained NSR and has recovered well. He has an essential tremor and is on Primidone chronically. 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 Amiodarone was stopped in January 2017 at the time he was hospitalized for an acute gout attack in his [place]. He has maintained NSR.  His wife keeps close check on him. She is a retired Quarry manager. He has a daughter who is a family physician in [place].  In May 2018 he complained of more dyspnea on exertion and some chest tightness.  CXR was clear. Myoview study was normal.  In June 2018 he underwent cystoscopy and lithotripsy for renal stones.   In September 2018 he fell off his roof fracturing his left ankle and had a compression fracture of L2. He required extensive surgery of his ankle.   In May 2020 he presented with symptoms of increased DOE. He underwent cardiac cath showing all grafts were patent. Echo showed normal EF with evidence of diastolic dysfunction. The AV prothesis function had not changed. He had mild pulmonary HTN. His diuretic dose was increased.  On follow up today he is doing OK. He does have white coat syndrome. Usually BP at home is 379-024 systolic with diastolic of 75. His breathing is much better. He took a higher dose of lasix for 4 days then dropped back to 40 mg every other day. No chest pain. He does note that he  has severe depression. Notes it has been going on for one year. No suicidal thoughts. Last saw psychiatry 15 years ago.    Current Outpatient Medications  Medication Sig Dispense Refill  . acetaminophen (TYLENOL) 500 MG tablet Take 2 tablets (1,000 mg total) by mouth every 6 (six) hours. 90 tablet 2  . allopurinol (ZYLOPRIM) 300 MG tablet TAKE 1 TABLET BY MOUTH EVERY DAY 90 tablet 1  . amLODipine (NORVASC) 10 MG tablet TAKE 1 TABLET BY MOUTH EVERY DAY 90 tablet 2  . Ascorbic Acid (VITAMIN C) 1000 MG tablet Take 1,000 mg by mouth 2 (two) times daily.     [name] aspirin 81 MG chewable tablet Chew 81 mg by mouth every evening.     . Carboxymethylcellulose Sodium (LUBRICANT EYE DROPS OP) Place 1 drop into both eyes 2 (two) times daily as needed (dry/irritated eyes.).     [name] Cholecalciferol (VITAMIN D-3) 125 MCG (5000 UT) TABS Take 5,000 Units by mouth 2 (two) times a day.    . famotidine (PEPCID) 40 MG tablet Take 1 tablet (40 mg total) by mouth daily. 90 tablet 3  . furosemide (LASIX) 40 MG tablet Take 1 tablet (40 mg total) by mouth every other day. 45 tablet 3  . ibuprofen (ADVIL) 200 MG tablet Take 400 mg by mouth every 8 (eight) hours as needed (for pain.).    [name] levothyroxine (SYNTHROID) 25 MCG tablet TAKE 1 TABLET BY MOUTH EVERY DAY BEFORE BREAKFAST (Patient taking differently: Take 25 mcg by  mouth daily before breakfast. ) 90 tablet 0  . nitroGLYCERIN (NITROSTAT) 0.4 MG SL tablet PLACE 1 TABLET UNDER THE TONGUE EVERY 5 MINUTES AS NEEDED FOR CHEST PAIN. 25 tablet 3  . polyethylene glycol (MIRALAX / GLYCOLAX) packet Take 17 g by mouth daily. (Patient taking differently: Take 17 g by mouth daily as needed (constipation.). ) 14 each 0  . primidone (MYSOLINE) 50 MG tablet TAKE 4 TABLETS (200 MG) BY MOUTH IN THE MORNING & 3 TABLET (150 MG) BY MOUTH AT NIGHT. 540 tablet 1  . rosuvastatin (CRESTOR) 10 MG tablet Take 0.5 tablets (5 mg total) by mouth daily. (Patient taking differently: Take 5 mg by mouth  every evening. ) 45 tablet 1  . tiZANidine (ZANAFLEX) 4 MG tablet Take 1 tablet (4 mg total) by mouth every 8 (eight) hours as needed for muscle spasms. 60 tablet 0  . Turmeric 500 MG CAPS Take 500 mg by mouth 2 (two) times daily.     . vitamin B-12 (CYANOCOBALAMIN) 1000 MCG tablet Take 1,000 mcg by mouth 2 (two) times daily.      Current Facility-Administered Medications  Medication Dose Route Frequency Provider Last Rate Last Dose  . sodium chloride flush (NS) 0.9 % injection 3 mL  3 mL Intravenous Q12H Almyra Deforest, [place]        Allergies  Allergen Reactions  . Benzodiazepines Other (See Comments)    Patient states he gets stroke symptoms with benzo's.   . Coreg [Carvedilol] Other (See Comments)    Accumulates and causes stroke-like side-effects due to 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 Patient poor metabolizer of CYP2D6 - Coreg undergoes extensive hepatic (including 2D6)  . Lisinopril Other (See Comments)    Hyperkalemia. 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  . Mobic [Meloxicam] Other (See Comments)    Accumulates and causes stroke-like side-effects due to 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  . Sertraline Hcl Other (See Comments)    Accumulates and causes stroke-like side-effects due to 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  . Tramadol Other (See Comments)    Accumulates and causes stroke-like side-effects due to 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  . Atorvastatin Other (See Comments)    MYALGIAS  . Colchicine Other (See Comments)    ataxia  . Compazine [Prochlorperazine Maleate] Other (See Comments)    Accumulates and causes stroke-like side-effects due to 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  . Flomax [Tamsulosin Hcl]     Accumulates and causes stroke-like side-effects due to 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  . Ondansetron Other (See Comments)    Accumulates and causes stroke-like side-effects due to 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   .  Promethazine Other (See Comments)    Accumulates and causes stroke-like side-effects due to 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  . Acyclovir And Related Other (See Comments)    Accumulates and causes stroke-like side-effects due to 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    Social History   Socioeconomic History  . Marital status: Single    Spouse name: Karmen Bongo, RN  . Number of children: 2  . Years of education: Not on file  . Highest education level: Not on file  Occupational History  . Occupation: Retired Wellsite geologist  . Financial resource strain: Not on file  . Food insecurity    Worry: Not on file    Inability: Not on file  . Transportation needs    Medical: Not on file    Non-medical: Not on file  Tobacco Use  . Smoking  status: Former Smoker    Packs/day: 1.00    Years: 34.00    Pack years: 34.00    Types: Cigarettes    Quit date: 06/06/1990    Years since quitting: 28.5  . Smokeless tobacco: Never Used  . Tobacco comment: widowed 1991 - lives with SO [name] who is RN  Substance and Sexual Activity  . Alcohol use: Yes    Alcohol/week: 1.0 standard drinks    Types: 1 Glasses of wine per week    Comment: occasional  . Drug use: No  . Sexual activity: Yes  Lifestyle  . Physical activity    Days per week: Not on file    Minutes per session: Not on file  . Stress: Not on file  Relationships  . Social Herbalist on phone: Not on file    Gets together: Not on file    Attends religious service: Not on file    Active member of club or organization: Not on file    Attends meetings of clubs or organizations: Not on file    Relationship status: Not on file  . Intimate partner violence    Fear of current or ex partner: Not on file    Emotionally abused: Not on file    Physically abused: Not on file    Forced sexual activity: Not on file  Other Topics Concern  . Not on file  Social History Narrative  . Not on file     Review of  Systems: As noted in HPI All other systems reviewed and are otherwise negative except as noted above.    Blood pressure (!) 176/76, pulse (!) 57, height 5\' 8"  (1.727 m), weight 207 lb 9.6 oz (94.2 kg), SpO2 94 %.  GENERAL:  Well appearing WM in NAD HEENT:  PERRL, EOMI, sclera are clear. Oropharynx is clear. NECK:  No jugular venous distention, carotid upstroke brisk and symmetric, no bruits, no thyromegaly or adenopathy LUNGS:  Clear to auscultation bilaterally CHEST:  Unremarkable, old sternotomy scar HEART:  RRR,  PMI not displaced or sustained,S1 and S2 within normal limits, no S3, no S4: no clicks, no rubs, gr 2/6 systolic murmur RUSB.  ABD:  Soft, nontender. BS +, no masses or bruits. No hepatomegaly, no splenomegaly EXT:  2 + pulses throughout, Tr edema on left- left calf is larger than right., no cyanosis no clubbing SKIN:  Warm and dry.  No rashes NEURO:  Alert and oriented x 3. Cranial nerves II through XII intact. PSYCH:  Cognitively intact    Lab Results  Component Value Date   WBC 6.0 10/19/2018   HGB 15.9 10/19/2018   HCT 46.9 10/19/2018   PLT 146 (L) 10/19/2018   GLUCOSE 85 10/19/2018   CHOL 129 11/02/2018   TRIG 97.0 11/02/2018   HDL 44.50 11/02/2018   LDLDIRECT 100.0 09/08/2014   LDLCALC 65 11/02/2018   ALT 12 10/31/2017   AST 14 10/31/2017   NA 143 10/19/2018   K 4.3 10/19/2018   CL 104 10/19/2018   CREATININE 0.92 10/19/2018   BUN 17 10/19/2018   CO2 23 10/19/2018   TSH 4.75 (H) 11/02/2018   PSA 1.39 11/02/2018   INR 1.47 03/30/2015   HGBA1C 5.9 11/02/2018   MICROALBUR 4.3 (H) 10/31/2017    Echo 04/24/15: Study Conclusions  - Left ventricle: The cavity size was normal. Wall thickness was   increased in a pattern of mild LVH. Systolic function was normal.   The estimated ejection  fraction was in the range of 55% to 60%.   Wall motion was normal; there were no regional wall motion   abnormalities. Doppler parameters are consistent with abnormal    left ventricular relaxation (grade 1 diastolic dysfunction). - Aortic valve: A bioprosthesis was present. There was trivial   regurgitation. - Left atrium: The atrium was mildly dilated. - Atrial septum: There was an atrial septal aneurysm. - Pericardium, extracardiac: A small pericardial effusion was   identified.  Impressions:  - Normal LV systolic function; grade 1 diastolic dysfunction; mild   LVH; s/p AVR with trace AI and mean gradient of 18 mmHg; mild   LAE; trace MR.  Myoview 10/22/16: Study Highlights     The left ventricular ejection fraction is mildly decreased (45-54%).  Nuclear stress EF: 53%.  No T wave inversion was noted during stress.  There was no ST segment deviation noted during stress.  This is a low risk study.   No reversible ischemia. LVEF 53% with normal wall motion. This is a low risk study.    Cardiac cath: 10/25/18:  LEFT HEART CATH AND CORS/GRAFTS ANGIOGRAPHY  Conclusion    Ost LAD to Prox LAD lesion is 80% stenosed. Mid LAD lesion is 100% stenosed -after second septal branch which coursed is a tandem LAD  LIMA-dLAD graft was visualized by angiography and is normal in caliber. The graft exhibits no disease.  Ost Ramus-1 lesion is 90% stenosed. Ost Ramus-2 lesion is 60% stenosed (shortly after lateral branch).  SVG-Ramus graft was visualized by angiography and is large. The graft exhibits minimal luminal irregularities.  Ost Cx lesion is 80% stenosed. Prox Cx to Mid Cx lesion is 80% stenosed. Mid Cx lesion is 60% stenosed.  SVG-dCx graft was visualized by angiography and is large. The graft exhibits minimal luminal irregularities. Retrograde filling of OM 2 and AV groove circumflex  Prox RCA to Mid RCA lesion is 100% stenosed. This is progression from 99% stenosis pre-CABG.  SVG-dRCA graft was visualized by angiography and is large. The graft exhibits no disease.  Unable to cross aortic valve prosthesis.   SUMMARY  Severe native  CAD essentially unchanged from post CABG with exception of now 100% occluded RCA.  4 out of 4 patent grafts: LIMA-LAD, SVG-RI (D1), SVG-dCx, SVG-D RCA  Systemic Hypertension  Angiographically small aortic root/stent prosthetic valve.  Unable to cross.   RECOMMENDATIONS  Discharge home after bedrest and hydration  Consider 2D echocardiogram to assess for restenosis of aortic valve  Pending echocardiographic evaluation, would consider non-anginal etiology -> with hypertension, could be related to diastolic dysfunction.   Glenetta Hew, MD   Echo 11/20/18:IMPRESSIONS    1. A 64mm an Edwards porcine bioprosthesis valve is present in the aortic position. Procedure Date: 03/30/15 Echo findings shows no evidence of regurgitation, perivalvular leak, rocking or dehiscence of the aortic prosthesis. Bioprosthetic leaflets not  well seen. Mean systolic gradient is 19 mmHg, no significant difference from gradient reported 1 month post operative in 04/2015.  2. The left ventricle has normal systolic function with an ejection fraction of 60-65%. The cavity size was normal. There is mildly increased left ventricular wall thickness. Left ventricular diastolic Doppler parameters are consistent with impaired  relaxation due to nondiagnostic images. Elevated left ventricular end-diastolic pressure The E/e' is 20.  3. The right ventricle has normal systolic function. The cavity was normal. There is no increase in right ventricular wall thickness. Right ventricular systolic pressure is mildly elevated with an estimated pressure of 35.9  mmHg.  4. Left atrial size was mild-moderately dilated.  5. There is mild mitral annular calcification present.  6. There is mild dilatation of the ascending aorta measuring 40 mm.   ASSESSMENT AND PLAN:  1.  CAD s/p CABG in October 2016 following NSTEMI.   Myoview negative in May 2018. Recent cardiac cath in May showed all grafts were patent. Continue statin.Not on  beta blocker due to bradycardia.  2. S/p bioprosthetic AVR for aortic stenosis. On routine SBE prophylaxis. Recent Echo unchanged from initial post op Echo. 3.  Hyperlipidemia. Satisfactory on Crestor 5 mg daily 4. HTN. With white coat syndrome. BP at home acceptable. Continue same meds.  5. DOE. Suspect related to diastolic dysfunction and hypertensive heart disease. Improved with short term increase in lasix. Now symptoms resolved. Will continue current dose. If symptoms recur can increase lasix for a few day.  6.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. 7. Severe depression. Medical options limited by #6. Encouraged him to consult with psychiatry.     Ireoluwa Gorsline [name] MD,FACC 12/13/2018 11:03 AM

## 2018-12-12 ENCOUNTER — Telehealth: Payer: Self-pay | Admitting: Cardiology

## 2018-12-12 DIAGNOSIS — H353131 Nonexudative age-related macular degeneration, bilateral, early dry stage: Secondary | ICD-10-CM | POA: Diagnosis not present

## 2018-12-12 DIAGNOSIS — D4981 Neoplasm of unspecified behavior of retina and choroid: Secondary | ICD-10-CM | POA: Diagnosis not present

## 2018-12-12 DIAGNOSIS — H43813 Vitreous degeneration, bilateral: Secondary | ICD-10-CM | POA: Diagnosis not present

## 2018-12-12 DIAGNOSIS — H25813 Combined forms of age-related cataract, bilateral: Secondary | ICD-10-CM | POA: Diagnosis not present

## 2018-12-12 DIAGNOSIS — H04123 Dry eye syndrome of bilateral lacrimal glands: Secondary | ICD-10-CM | POA: Diagnosis not present

## 2018-12-12 NOTE — Telephone Encounter (Signed)
Returned call to [employer] (GIRLFRIEND) notified pt only, mask. Phone kept cutting out and phone got disconnected. GF still wants to come in but notified pt only.

## 2018-12-12 NOTE — Telephone Encounter (Signed)
New Message:    I called to confirm pt's appt for tomorrow. Wife says she have to come in to the office with the patient. She said she had to talk to Dr [name] and wanted  To be there about a new treatment Dr [name] was talking about. IShe had to be present at office visit.

## 2018-12-12 NOTE — Telephone Encounter (Signed)
° ° °  COVID-19 Pre-Screening Questions:   In the past 7 to 10 days have you had a cough,  shortness of breath, headache, congestion, fever (100 or greater) body aches, chills, sore throat, or sudden loss of taste or sense of smell? no  Have you been around anyone with known Covid 19. no  Have you been around anyone who is awaiting Covid 19 test results in the past 7 to 10 days? no  Have you been around anyone who has been exposed to Covid 19, or has mentioned symptoms of Covid 19 within the past 7 to 10 days? no  If you have any concerns/questions about symptoms patients report during screening (either on the phone or at threshold). Contact the provider seeing the patient or DOD for further guidance.  If neither are available contact a member of the leadership team.   I called to confirm pt's appt for 12-13-18 with Dr [name].

## 2018-12-13 ENCOUNTER — Ambulatory Visit (INDEPENDENT_AMBULATORY_CARE_PROVIDER_SITE_OTHER): Payer: Medicare Other | Admitting: Cardiology

## 2018-12-13 ENCOUNTER — Encounter: Payer: Self-pay | Admitting: Cardiology

## 2018-12-13 ENCOUNTER — Other Ambulatory Visit: Payer: Self-pay

## 2018-12-13 VITALS — BP 176/76 | HR 57 | Ht 68.0 in | Wt 207.6 lb

## 2018-12-13 DIAGNOSIS — I5032 Chronic diastolic (congestive) heart failure: Secondary | ICD-10-CM

## 2018-12-13 DIAGNOSIS — I25810 Atherosclerosis of coronary artery bypass graft(s) without angina pectoris: Secondary | ICD-10-CM

## 2018-12-13 DIAGNOSIS — I10 Essential (primary) hypertension: Secondary | ICD-10-CM

## 2018-12-13 DIAGNOSIS — Z952 Presence of prosthetic heart valve: Secondary | ICD-10-CM | POA: Diagnosis not present

## 2018-12-13 DIAGNOSIS — E785 Hyperlipidemia, unspecified: Secondary | ICD-10-CM | POA: Diagnosis not present

## 2018-12-13 MED ORDER — FUROSEMIDE 40 MG PO TABS: 40.0000 mg | ORAL_TABLET | ORAL | 3 refills | Status: DC

## 2018-12-21 ENCOUNTER — Other Ambulatory Visit: Payer: Self-pay | Admitting: Cardiology

## 2019-01-22 NOTE — Progress Notes (Signed)
Subjective:   Troy Banks was seen in consultation in the movement disorder clinic at the request of [name], MD.  The evaluation is for tremor.  The records that were made available to me were reviewed.  Pt is accompanied by his significant other who supplements the hx.    The patient is a 78 y.o. left handed male with a history of tremor.  Pt reports tremor for at least 30 years.  Pt has noted increasing tremor over the years. Tremor is mostly in the right hand, but it is in the left as well and more rarely in the head as well.  Stress brings out the tremor.   He has never been to a neurologist for tremor.   Is currently on small amount of metoprolol - 25 mg - but that cannot be increased due to hx of bradycardia.  There is a family hx of tremor in his mother, maternal GM, maternal sister and maternal cousins.    Affected by caffeine:  No. (only drinks 2 cups coffee per week; drinks green tea) Affected by alcohol:  Used to decrease tremor but doesn't seem to any longer Affected by stress:  Yes.   Affected by fatigue:  Yes.   Spills soup if on spoon:  No. but has trouble with corn/peas Spills glass of liquid if full:  Yes.   Affects ADL's (tying shoes, brushing teeth, etc):  Yes.   (slow to tie shoes)  06/30/14 update:  Last visit, we started the patient on primidone for his tremor.  He cannot take Topamax because of history of nephrolithiasis or beta blockers because of bradycardia.  The patient reports that he is overall doing well.  For the first time in years, he was able to stand up and put on his jeans as he thinks that the medication helped his balance as well as tremor.  He has good and bad days in terms of tremor.  He has no SE in terms of tremor.  He states though that his metoprolol has been cut back since last visit because of bradycardia.    10/08/14 update:  Pt is on primidone 72m bid.  Pt states that initially it seemed helpful but nowl having tremor; had an episode  last week where couldn't shave because of tremor (not unusual to see tremor while shaving but almost unable to shave) and then whole body started shaking.  Lasted all day.  He had more word finding issues that day (has baseline word finding issues).  States that "I am concerned about something beyond familial tremor."  He is worried about PD.  Having cramping in the fingers and sometimes in the toes. Hands/fingers more than the feet.  He notices that his right arm "jerks", but thinks that that is due to "a botched neck surgery."  No significant issues with swallowing.  No diplopia.  10/30/14 update:  The patient is seen today, accompanied by his wife who supplements the history.  He had an MRI of the brain on 10/24/2014 that was unremarkable with exception of mild small vessel disease.  He had an MRI of the cervical spine demonstrating stenosis at several levels, but especially at the C6-C7 level and C3-C4 levels.  There was questionable thyroid enlargement and he was asked to follow up with his primary care physician in that regard.  On 10/28/2014 he underwent an EMG at our office.  This suggested chronic on active changes and intraspinal lesion at the right C6 level  and right S1 levels.  The etiology of this is unknown, but it could be anywhere from compressive etiologies to degeneration of the anterior horn cells.  He does not meet criteria for motor neuron disease at this point in time.  In regards to tremor, he has a few what he calls "episodes" in which his arms or legs will tremor more and may last 45 seconds.  BS was normal.  No known stress sets them off.  He may have a stuttering speech when it happens.    03/17/15 update:  The patient presents for follow-up today.  He is on primidone, 50 mg bid for essential tremor. Tremor has picked up but mostly with stress.  He thinks that we need to increase the primidone.   He is not a candidate for Topamax because of nephrolithiasis and is not a candidate for  beta blockers because of bradycardia.  We have worked him up extensively because of fasciculations.   No falls but "my balance is way off."   He had an MRI of the lumbar spine after we last met.  This was done on June 7.  There was evidence of severe facet arthrosis at the L4-L5 level and L5-S1 levels.  I told him that these findings do not explain all of his findings, including fasciculations.  He had an EEG on June 2 that was unremarkable.  This was done because of episodes of transient alteration of awareness that his wife had described.  His wife states that they didn't do the prolonged EEG because his insurance wouldn't allow it without a significant copay.  They are planning on r/s soon.  07/21/15 update:  The patient is seen today in follow-up, accompanied by his wife who supplements the history.  Much has happened since our last visit and I did review records.  Last visit, I increased his primidone to 100 mg in the morning and 50 mg at night.  He ended up being hospitalized in October, 2016 with an NSTEMI and ultimately required a coronary artery bypass graft and aortic valve replacement on 03/30/2015.  While in the hospital for this, he developed atrial fibrillation and was started on amiodarone.  Not surprisingly, this caused a significant increase in tremor.  He was rehospitalized in January, 2017 for acute gout and while he was in there he mentioned that tremor had increased.  His amiodarone was stopped.  He did have a repeat EMG since our last visit and this looked much better, without evidence of motor neuron disease.  He does feel that his balance has been a bit off.  Did not do cardiac rehab after CABG.    12/18/15 update:  The patient is seen today in follow-up.  He is accompanied by his wife who supplements the history.  I have reviewed prior records made available to me.  He remains on primidone, 100 mg in the morning and 50 mg at night. Been after amiodarone for 7 months and at the beginning of  May, he felt that tremor was much better but after hip surgery, felt that he regressed.  Surgery done under spinal anesthesia.  He had a total hip replacement on 10/27/2015.  Only did a week of therapy after that.  Felt that balance and tremor was worse after surgery.  Been losing weight through exercise.  Was put on synthroid 3 months ago.  04/19/16 update:  Pt f/u today accompanied by his wife who supplements the hx.  On primidone for ET,100 mg in  the AM/50 mg at night.  Has good days and bad days with tremor.  Reviewed Dr. Doug Sou notes.  Still off of amiodarone.  Is planning on selling home in spring.  Planning on moving to [place], [place].  Lost weight with lifestyle changes.  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  Mood much better now that back at work 5 days a week.  10/17/16 update:  Patient seen today in follow-up, accompanied by his wife who supplements the history.  The patient remains on primidone, 100 mg bid which he had increased since last visit.  He states that helped and "you can actually read my signature."  Biggest issue is low energy and he thinks that is related to his heart but he doesn't want to see the cardiologist.  He is too scared he would need another surgery.  Having MS chest pain over the scar.    Carotid ultrasound on 04/19/2016 was normal, with 1-39% stenosis bilaterally.  Little more imbalance.  Had to go on prednisone for gout.  Hasn't had A1C checked in a year.  Rarely checks BS at home - not checked in 3 weeks and "I know its high."    02/22/17 update:  Pt seen in f/u for ET.    The records that were made available to me were reviewed.  Had cystoscopy and lithotripsy since last visit.  After that tremor increased.  Called here and we increased his primidone to 150 in the AM and continued 100 mg at night.  He notes that stress makes it worse as does eating sugar.  States that he thinks that this is what he is taking but wife does his medication so he isn't  positive about that.  He is c/o loss of balance.  Is prediabetic.  Is not eating sugary foods.  Having subacute CP.  Worried about that.    07/25/17 update: Patient is seen today in follow-up for essential tremor.  I have reviewed records made available to me.  Patient is currently on 150 mg of primidone in the morning and 100 mg at night.  He reports that tremor picked up a bit after he was exposed to anesthesia.  Records have been reviewed since last visit.  Has been in the hospital a few times since our last visit.  Was in the hospital in September as he fell off a roof and landed on the left ankle.  He underwent external fixation on February 22, 2017 but could not have ankle fusion at the time because of significant swelling.  He was also found to have an acute L2 compression fracture.  He was discharged to a nursing facility for skilled rehab at [facility].  He liked therapy there but otherwise didn't care for the facility.  He returned to the hospital in October for fusion of the left ankle and external fixator pin site debridement.  Patient has been faithfully attending physical therapy.  He f/u 3/12 and he hopes to get rid of boot then.  01/22/18 update: Patient is seen today in follow-up for essential tremor.  He is on primidone 150 mg in the morning and 100 mg at night.  Wife states tremor is worse but pt states that it is intermittently worse, esp with fatigue/stress.  Walking/balance is better as he changed shoes.   He is still riding motorcycles.  Records are reviewed since last visit.  He did attend physical therapy for left ankle pain.  He has also been seen  for buttocks cyst which became infected.  His insurance denied surgery because it wasn't infected at the time and told him it was elective.  Pt states that "it will come back."  07/25/18 update: Patient is seen today in follow-up for essential tremor.  He is accompanied by his wife who supplements his history.  Patient is on primidone, 150 mg  in the morning and 100 mg at night.  Tremor seems to have worse times.  Records are reviewed.  He last saw cardiology on January 13.  No medication changes were made.  Asked today about focused ultrasound.  Mentions some balance problems today.  No falls but trouble walking distances.  Asks about handicap sticker.    01/24/19 update: Patient seen today in follow-up for essential tremor.  Wife on phone and supplements the history.  They called me in April and wanted to increase the primidone.  He is currently on primidone, 50 mg, 4 tablets in the morning and 3 tablets at night.  He continues to have tremor that is bothersome for him.  He states that he is "worse."  He states that tremor doesn't interfere with ADL's but he does think that balance is worse.  No falls.  He cannot give me any details of how the tremor is worse despite asking multiple times.   The records that were made available to me were reviewed.  He saw cardiology on July 9.  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  Current/Previously tried tremor medications: neurontin - was up to 300 mg bid for rotator cuff issues but never seemed to help tremor; back on it at low dose for hip pain.    Outside reports reviewed: historical medical records and referral letter/letters.  Allergies  Allergen Reactions   Benzodiazepines Other (See Comments)    Patient states he gets stroke symptoms with benzo's.    Coreg [Carvedilol] Other (See Comments)    Accumulates and causes stroke-like side-effects due to 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 Patient poor metabolizer of CYP2D6 - Coreg undergoes extensive hepatic (including 2D6)   Lisinopril Other (See Comments)    Hyperkalemia. 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   Mobic [Meloxicam] Other (See Comments)    Accumulates and causes  stroke-like side-effects due to 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   Sertraline Hcl Other (See Comments)    Accumulates and causes stroke-like side-effects due to 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   Tramadol Other (See Comments)    Accumulates and causes stroke-like side-effects due to 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   Atorvastatin Other (See Comments)    MYALGIAS   Colchicine Other (See Comments)    ataxia   Compazine [Prochlorperazine Maleate] Other (See Comments)    Accumulates and causes stroke-like side-effects due to 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   Flomax [Tamsulosin Hcl]     Accumulates and causes stroke-like side-effects due to 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   Ondansetron Other (See Comments)    Accumulates and causes stroke-like side-effects due to 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    Promethazine Other (See Comments)    Accumulates and causes stroke-like side-effects due to 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   Acyclovir And Related Other (See Comments)    Accumulates and causes stroke-like side-effects due to 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    Outpatient Encounter Medications as of 01/24/2019  Medication Sig   acetaminophen (TYLENOL) 500 MG tablet Take 2  tablets (1,000 mg total) by mouth every 6 (six) hours.   allopurinol (ZYLOPRIM) 300 MG tablet TAKE 1 TABLET BY MOUTH EVERY DAY   amLODipine (NORVASC) 10 MG tablet TAKE 1 TABLET BY MOUTH EVERY DAY   Ascorbic Acid (VITAMIN C) 1000 MG tablet Take 1,000 mg by mouth 2 (two) times daily.    aspirin 81 MG chewable tablet Chew 81 mg by mouth every evening.    Carboxymethylcellulose Sodium (LUBRICANT EYE DROPS OP) Place 1 drop into both eyes 2 (two) times daily as needed (dry/irritated eyes.).    Cholecalciferol (VITAMIN D-3) 125 MCG (5000 UT) TABS Take 5,000 Units by mouth 2 (two) times a day.   famotidine (PEPCID) 40 MG tablet Take 1 tablet (40 mg total) by mouth daily.   furosemide (LASIX) 40 MG tablet  Take 1 tablet (40 mg total) by mouth every other day.   ibuprofen (ADVIL) 200 MG tablet Take 400 mg by mouth every 8 (eight) hours as needed (for pain.).   levothyroxine (SYNTHROID) 25 MCG tablet TAKE 1 TABLET BY MOUTH EVERY DAY BEFORE BREAKFAST (Patient taking differently: Take 25 mcg by mouth daily before breakfast. )   nitroGLYCERIN (NITROSTAT) 0.4 MG SL tablet PLACE 1 TABLET UNDER THE TONGUE EVERY 5 MINUTES AS NEEDED FOR CHEST PAIN.   polyethylene glycol (MIRALAX / GLYCOLAX) packet Take 17 g by mouth daily. (Patient taking differently: Take 17 g by mouth daily as needed (constipation.). )   primidone (MYSOLINE) 50 MG tablet TAKE 4 TABLETS (200 MG) BY MOUTH IN THE MORNING & 3 TABLET (150 MG) BY MOUTH AT NIGHT.   rosuvastatin (CRESTOR) 10 MG tablet Take 0.5 tablets (5 mg total) by mouth daily. (Patient taking differently: Take 5 mg by mouth every evening. )   tiZANidine (ZANAFLEX) 4 MG tablet Take 1 tablet (4 mg total) by mouth every 8 (eight) hours as needed for muscle spasms.   Turmeric 500 MG CAPS Take 500 mg by mouth 2 (two) times daily.    vitamin B-12 (CYANOCOBALAMIN) 1000 MCG tablet Take 1,000 mcg by mouth 2 (two) times daily.    [DISCONTINUED] ranitidine (ZANTAC) 150 MG tablet TAKE 1 TABLET BY MOUTH 2 TIMES DAILY (Patient taking differently: Take 150 mg by mouth 2 (two) times a day. )   Facility-Administered Encounter Medications as of 01/24/2019  Medication   sodium chloride flush (NS) 0.9 % injection 3 mL    Past Medical History:  Diagnosis Date   Allergy    Anxiety    PTSD   Aortic stenosis, moderate 03/27/2015   post AVR echo Echo 11/16: Mild LVH, EF 55-60%, normal wall motion, grade 1 diastolic dysfunction, bioprosthetic AVR okay (mean gradient 18 mmHg) with trivial AI, mild LAE, atrial septal aneurysm, small effusion    Arthritis    "qwhere" (10/29/2015)   Carotid artery stenosis    Carotid US 11/17: 1-39% bilateral ICA stenosis. F/u prn   Cervical stenosis  of spinal canal    fusion 2006   CHF (congestive heart failure) ([place])    "secondry to [facility]"   Closed left pilon fracture    COLONIC POLYPS, HX OF    Complication of anesthesia    "takes a lot to put him under and has woken up during surgery" Difficult to wake up . PTSD; "does not metabolize RX well"Pt. reports that he gets "violent", per pt.    Diabetes mellitus without complication ([place])    diet controlled   DIVERTICULOSIS, COLON    Dysrhythmia    2  bouts of afib post op and at home but corrected with medication.   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 ([place])    confirmed heterozygous 10/24/14 labs   PTSD (post-traumatic stress disorder)    Sleep apnea    On a CPAP    Past Surgical History:  Procedure Laterality Date   ANKLE FUSION Left 03/17/2017   Procedure: FUSION  PILON FRACTURE WITH BONE GRAFTING;  Surgeon: Shona Needles, MD;  Location: [place];  Service: Orthopedics;  Laterality: Left;   ANTERIOR FUSION CERVICAL SPINE  2002   AORTIC VALVE REPLACEMENT N/A 03/30/2015   Procedure: AORTIC VALVE REPLACEMENT (AVR);  Surgeon: Grace Isaac, MD;  Location: [place];  Service: Open Heart Surgery;  Laterality: N/A;   BACK SURGERY     CARDIAC CATHETERIZATION N/A 03/26/2015   Procedure: Left Heart Cath and Coronary Angiography;  Surgeon: Leonie Man, MD;  Location: [place] [facility];  Service: Cardiovascular;  Laterality: N/A;   CARDIAC VALVE REPLACEMENT     CORONARY ARTERY BYPASS GRAFT N/A 03/30/2015   Procedure: CORONARY ARTERY BYPASS GRAFTING (CABG)  x four, using left internal mammary artery and left    leg greater saphenous vein harvested endoscopically ;  Surgeon: Grace Isaac, MD;  Location: [place];  Service: Open Heart Surgery;  Laterality: N/A;   CYSTOSCOPY/URETEROSCOPY/HOLMIUM LASER/STENT PLACEMENT Bilateral 11/07/2016   Procedure: CYSTOSCOPY/URETEROSCOPY/HOLMIUM LASER/STENT PLACEMENT;  Surgeon: Nickie Retort, MD;  Location: [facility];  Service: Urology;  Laterality: Bilateral;   CYSTOSCOPY/URETEROSCOPY/HOLMIUM LASER/STENT PLACEMENT Bilateral 11/23/2016   Procedure: CYSTOSCOPY/BILATERAL URETEROSCOPY/HOLMIUM LASER/STENT EXCHANGE;  Surgeon: Nickie Retort, MD;  Location: [facility];  Service: Urology;  Laterality: Bilateral;  NEEDS 90 MIN    EXTERNAL FIXATION LEG Left 02/22/2017   Procedure: EXTERNAL FIXATION LEFT ANKLE;  Surgeon: Renette Butters, MD;  Location: [place];  Service: Orthopedics;  Laterality: Left;   HEMORRHOID BANDING  1970s   INGUINAL HERNIA REPAIR Left 1962   JOINT REPLACEMENT     KIDNEY STONE SURGERY  1986   "cut me open"   LEFT HEART CATH AND CORS/GRAFTS ANGIOGRAPHY N/A 10/25/2018   Procedure: LEFT HEART CATH AND CORS/GRAFTS ANGIOGRAPHY;  Surgeon: Leonie Man, MD;  Location: [place] [facility];  Service: Cardiovascular;  Laterality: N/A;   POSTERIOR FUSION CERVICAL SPINE  2006   TEE WITHOUT CARDIOVERSION N/A 03/30/2015   Procedure: TRANSESOPHAGEAL ECHOCARDIOGRAM (TEE);  Surgeon: Grace Isaac, MD;  Location: [place];  Service: Open Heart Surgery;  Laterality: N/A;   TESTICLE TORSION REDUCTION Right 1960   TOE FUSION Left 1985 & 2004   "great toe"   TONSILLECTOMY     TOTAL HIP ARTHROPLASTY Right 10/27/2015   Procedure: RIGHT TOTAL HIP ARTHROPLASTY ANTERIOR APPROACH and steroid injection right foot;  Surgeon: Mcarthur Rossetti, MD;  Location: [place];  Service: Orthopedics;  Laterality: Right;    Social History   Socioeconomic History   Marital status: Single    Spouse name: [name]  Amada Jupiter, RN   Number of children: 2   Years of education: Not on file   Highest education level: Some college, no degree  Occupational History   Occupation: Retired Microbiologist strain: Not on file   Food insecurity    Worry: Not on file    Inability: Not on Lexicographer needs    Medical: Not on file    Non-medical: Not on file  Tobacco Use   Smoking status: Former Smoker    Packs/day: 1.00    Years: 34.00    Pack years: 34.00    Types: Cigarettes    Quit date: 06/06/1990    Years since quitting: 28.6   Smokeless tobacco: Never Used   Tobacco comment: widowed 1991 - lives with SO [name] who is Therapist, sports  Substance and Sexual Activity   Alcohol use: Yes    Alcohol/week: 1.0 standard drinks    Types: 1 Glasses of wine per week    Comment: occasional   Drug use: No   Sexual activity: Yes  Lifestyle   Physical activity    Days per week: Not on file    Minutes per session: Not on file   Stress: Not on file  Relationships   Social connections    Talks on phone: Not on file    Gets together: Not on file    Attends religious service: Not on file    Active member of club or organization: Not on file    Attends meetings of clubs or organizations: Not on file    Relationship status: Not on file   Intimate partner violence    Fear of current or ex partner: Not on file    Emotionally abused: Not on file    Physically abused: Not on file    Forced sexual activity: Not on file  Other Topics Concern   Not on file  Social History Narrative   Not on file    Family Status  Relation Name Status   Mother  Deceased       Heart Disease, tremor   Father  Deceased       Heart Disease   Sister  Deceased       tremor   Brother  Alive       healthy   Son  Alive       healthy   Mat Uncle  (Not Specified)   MGM  (Not Specified)   MGF  (Not Specified)   PGF  (Not Specified)   Other  (Not Specified)    Sister  Deceased   Daughter  Alive   Neg Hx  (Not Specified)    Review of Systems Review of Systems  Constitutional: Negative.   HENT: Negative.   Eyes: Negative.   Respiratory: Negative.   Cardiovascular: Negative.   Gastrointestinal: Negative.   Genitourinary: Negative.   Skin: Negative.   Neurological: Positive for tremors.      Objective:   VITALS:   Vitals:   01/24/19 0810  BP: (!) 180/80  Pulse: (!) 50  SpO2: 97%  Weight: 211 lb 6.4 oz (95.9 kg)  Height: _0  (1.727 m)   Wt Readings from Last 3 Encounters:  01/24/19 211 lb 6.4 oz (95.9 kg)  12/13/18 207 lb 9.6 oz (94.2 kg)  11/02/18 209 lb (94.8 kg)    GEN:  The patient appears stated age and is in NAD.  He is somewhat tearful during our  conversation. HEENT:  Normocephalic, atraumatic.  The mucous membranes are moist. The superficial temporal arteries are without ropiness or tenderness. CV:  Loletha Grayer.  regular Lungs:  CTAB Neck/HEME:  There are no carotid bruits bilaterally.  Neurological examination:  Orientation: The patient is alert and oriented x3. Cranial nerves: There is good facial symmetry. The speech is fluent and clear. Soft palate rises symmetrically and there is no tongue deviation. Hearing is intact to conversational tone. Sensation: Sensation is intact to light touch throughout Motor: Strength is 5/5 in the bilateral upper and lower extremities.   Shoulder shrug is equal and symmetric.  There is no pronator drift. Gait:  Walks well in the hallway  MOVEMENT EXAM: Tremor:  There is intermittent head tremor.  There is intention tremor bilaterally.  There is mild to mod postural tremor.  Has trouble with archimedes spirals, L more than R (same as prior)  Labs:    Chemistry      Component Value Date/Time   NA 143 10/19/2018 1411   K 4.3 10/19/2018 1411   CL 104 10/19/2018 1411   CO2 23 10/19/2018 1411   BUN 17 10/19/2018 1411   CREATININE 0.92 10/19/2018 1411   CREATININE 0.94 03/04/2016  0918      Component Value Date/Time   CALCIUM 9.4 10/19/2018 1411   ALKPHOS 56 10/31/2017 1528   AST 14 10/31/2017 1528   ALT 12 10/31/2017 1528   BILITOT 0.9 10/31/2017 1528     Lab Results  Component Value Date   HGBA1C 5.9 11/02/2018   Lab Results  Component Value Date   WBC 6.0 10/19/2018   HGB 15.9 10/19/2018   HCT 46.9 10/19/2018   MCV 89 10/19/2018   PLT 146 (L) 10/19/2018   Lab Results  Component Value Date   TSH 4.75 (H) 11/02/2018        Assessment/Plan:   1.  Essential Tremor.  -This is evidenced by the symmetrical nature and longstanding hx of gradually getting worse and family hx.  -increase primidone, 50 mg, 4 tablets twice per day.  Risks, benefits, side effects and alternative therapies were discussed.  The opportunity to ask questions was given and they were answered to the best of my ability.  The patient expressed understanding and willingness to follow the outlined treatment protocols.  -long discussion with patient that there is likely not much more medically we can do.  He is already bradycardic so cannot have have more beta blocker.  He cannot have topamax because of nephrlithiasis.  He cannot have anticholinergics due to loss of balance.  -discussed DBS but discussed he wouldn't be a candidate unless depression was treated, and he refused tx for depression.  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  Told him that DBS, if he entertained that, could make loss of balance worse in 30% of cases if done bilaterally.  Handicap placard provided at his request. 2.  Dizziness/fatigue/balance change  -recommended against riding motorcycle.  He feels that balance is good on the motorcycle.   3.  Fasciculations  -repeat EMG in 05/2015 much improved without evidence of MND  -The patient does have a compressive lesion in the cervical spine, but I'm not sure that that explains all of his symptoms.   4.  L carotid  bruit  -Carotid ultrasound on 04/19/2016 was normal, with 1-39% stenosis bilaterally.  No further testing/follow up recommended by his PA, [employer] weaver.  Brought up this up again  today.  They don't wish to proceed with further eval  5.  Episodes of tremulousness, accompanied by potential speech change and ataxia according to his wife  - Routine EEG was negative and no further spells. 6. 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  Offered referral.   Pt declined.  He was adament that he has tried before and failed treatment and didn't want to try again.  He denied SI/HI (wife did as well on phone).  Will let me know if changes mind on referral.   Understands that this affects DBS therapy. 7.  HTN  -BP very elevated in the office today.  Pt asymptommatic.  Told him to make appt with PCP and/or cardiology.  Pt states that he just has white coat HTN 8.  Follow up is anticipated in the next 4-6 months, sooner should new neurologic issues arise.  Much greater than 50% of this visit was spent in counseling and coordinating care.  Total face to face time:  30 min.  Verbally gives me consent to talk with daughter (a PCP in [place]) if she contacts me.

## 2019-01-24 ENCOUNTER — Encounter: Payer: Self-pay | Admitting: Neurology

## 2019-01-24 ENCOUNTER — Ambulatory Visit (INDEPENDENT_AMBULATORY_CARE_PROVIDER_SITE_OTHER): Payer: Medicare Other | Admitting: Neurology

## 2019-01-24 ENCOUNTER — Other Ambulatory Visit: Payer: Self-pay

## 2019-01-24 VITALS — BP 180/80 | HR 50 | Ht 68.0 in | Wt 211.4 lb

## 2019-01-24 DIAGNOSIS — F331 Major depressive disorder, recurrent, moderate: Secondary | ICD-10-CM | POA: Diagnosis not present

## 2019-01-24 DIAGNOSIS — G250 Essential tremor: Secondary | ICD-10-CM

## 2019-01-24 NOTE — Patient Instructions (Signed)
Increase primidone to 50 mg, 4 tablets twice per day.  Do NOT stop this cold [place]!  Let me know if you change your mind and want treatment for depression.

## 2019-02-08 ENCOUNTER — Other Ambulatory Visit: Payer: Self-pay | Admitting: Neurology

## 2019-02-08 DIAGNOSIS — H01001 Unspecified blepharitis right upper eyelid: Secondary | ICD-10-CM | POA: Diagnosis not present

## 2019-02-08 DIAGNOSIS — H04123 Dry eye syndrome of bilateral lacrimal glands: Secondary | ICD-10-CM | POA: Diagnosis not present

## 2019-02-08 DIAGNOSIS — H353131 Nonexudative age-related macular degeneration, bilateral, early dry stage: Secondary | ICD-10-CM | POA: Diagnosis not present

## 2019-02-08 DIAGNOSIS — H25813 Combined forms of age-related cataract, bilateral: Secondary | ICD-10-CM | POA: Diagnosis not present

## 2019-02-12 ENCOUNTER — Telehealth: Payer: Self-pay | Admitting: Neurology

## 2019-02-12 MED ORDER — PRIMIDONE 50 MG PO TABS: ORAL_TABLET | ORAL | 1 refills | Status: DC

## 2019-02-12 NOTE — Telephone Encounter (Signed)
Jarrett Soho from [facility] called requesting the prescription for primidone 50MG  needs rewritten to state: 4 tablets twice daily. She said this is what the patient's care giver told her.

## 2019-02-12 NOTE — Telephone Encounter (Signed)
Patient's wife, Karmen Bongo, called with concerns her husbands prescriptions for primidone may need to be rewritten with different instruction.

## 2019-02-12 NOTE — Telephone Encounter (Signed)
Per last office note on 01/24/19 Assessment/Plan:   1.  Essential Tremor.             -This is evidenced by the symmetrical nature and longstanding hx of gradually getting worse and family hx.             -increase primidone, 50 mg, 4 tablets twice per day.  Risks, benefits, side effects and alternative therapies were discussed.  The opportunity to ask questions was given and they were answered to the best of my ability.  The patient expressed understanding and willingness to follow the outlined treatment protocols.  Called patient spouse to confirm how Rx was sent she states that Rx was sent for 3 tablets in AM 2 tablets in PM  Rx was corrected and sent in for the right dose.  Pharmacy verified

## 2019-02-12 NOTE — Telephone Encounter (Signed)
primidone (MYSOLINE) 50 MG tablet 450 tablet 1 02/12/2019    Sig: TAKE FOUR TABLETS TWO TIMES A DAY   Sent to pharmacy as: primidone (MYSOLINE) 50 MG tablet   Notes to Pharmacy: PLEASE D/C RX SENT ON 02/08/19   E-Prescribing Status: Receipt confirmed by pharmacy (02/12/2019 9:33 AM EDT)      Rx was sent in for 4 tablets twice a day  Not sure why it needs to be rewritten  Twice a day and two times a day is the same thing

## 2019-02-28 ENCOUNTER — Telehealth: Payer: Self-pay | Admitting: Internal Medicine

## 2019-02-28 ENCOUNTER — Other Ambulatory Visit: Payer: Self-pay | Admitting: Cardiology

## 2019-02-28 MED ORDER — PANTOPRAZOLE SODIUM 40 MG PO TBEC: 40.0000 mg | DELAYED_RELEASE_TABLET | Freq: Every day | ORAL | 3 refills | Status: DC

## 2019-02-28 NOTE — Telephone Encounter (Signed)
° ° °  Patient out of medication    1. Which medications need to be refilled? (please list name of each medication and dose if known) furosemide (LASIX) 40 MG tablet  2. Which pharmacy/location (including street and city if local pharmacy) is medication to be sent to?[facility] - [place], [place] - [address]  3. Do they need a 30 day or 90 day supply? [place]

## 2019-02-28 NOTE — Addendum Note (Signed)
Addended by: [name] on: 02/28/2019 04:54 PM   Modules accepted: Orders

## 2019-02-28 NOTE — Telephone Encounter (Signed)
Patient Wife is calling Karmen Bongo regarding famotidine (PEPCID) 40 MG tablet [id number] .  The pepcid is not working.  The patient is having heart burn, not cardic related. Patient was previously on Zantic which worked fine. Please advise CB- [phone]

## 2019-02-28 NOTE — Telephone Encounter (Signed)
Unfortunately, zantac is no longer available due to a recall  Ok for protonix 40 qd

## 2019-03-01 NOTE — Telephone Encounter (Signed)
Medical screening examination/treatment/procedure(s) were performed by non-physician practitioner and as supervising physician I was immediately available for consultation/collaboration. I agree with above. Erline Siddoway, MD   

## 2019-03-01 NOTE — Telephone Encounter (Signed)
[name] informed of below. She states she has already spoken to pharmacist and patient can not take pantoprazole, nor is there anything on the market that he can take.

## 2019-04-01 ENCOUNTER — Ambulatory Visit (INDEPENDENT_AMBULATORY_CARE_PROVIDER_SITE_OTHER): Payer: Medicare Other | Admitting: *Deleted

## 2019-04-01 DIAGNOSIS — Z0000 Encounter for general adult medical examination without abnormal findings: Secondary | ICD-10-CM | POA: Diagnosis not present

## 2019-04-01 NOTE — Progress Notes (Addendum)
Subjective:   Troy Banks is a 78 y.o. male who presents for Medicare Annual/Subsequent preventive examination. I connected with patient by a telephone and verified that I am speaking with the correct person using two identifiers. Patient stated full name and DOB. Patient gave permission to continue with telephonic visit. Patient's location was at home and Nurse's location was at [place] office. Participants during this visit included patient and nurse.  Review of Systems:  Cardiac Risk Factors include: advanced age (>5men, >67 women);dyslipidemia;male gender Sleep patterns: feels rested on waking and sleeps 6-7 hours nightly.    Home Safety/Smoke Alarms: Feels safe in home. Smoke alarms in place.  Living environment; residence and Firearm Safety: 1-story house/ trailer. Lives with wife, no needs for DME, good support system Seat Belt Safety/Bike Helmet: Wears seat belt.     Objective:    Vitals: There were no vitals taken for this visit.  There is no height or weight on file to calculate BMI.  Advanced Directives 04/01/2019 01/24/2019 10/25/2018 03/19/2017 02/28/2017 02/25/2017 02/22/2017  Does Patient Have a Medical Advance Directive? Yes Yes Yes Yes Yes Yes Yes  Type of Paramedic of [place];Living will Living will;Healthcare Power of [place];Living will Out of facility DNR (pink MOST or yellow form) Out of facility DNR (pink MOST or yellow form) [place];Living will [place];Living will  Does patient want to make changes to medical advance directive? - - No - Patient declined No - Patient declined No - Patient declined No - Patient declined -  Copy of [place] in Chart? No - copy requested - No - copy requested No - copy requested No - copy requested No - copy requested -  Would patient like information on creating a medical advance directive? - - - - - - -    Tobacco  Social History   Tobacco Use  Smoking Status Former Smoker  . Packs/day: 1.00  . Years: 34.00  . Pack years: 34.00  . Types: Cigarettes  . Quit date: 06/06/1990  . Years since quitting: 28.8  Smokeless Tobacco Never Used  Tobacco Comment   widowed 1991 - lives with SO [name] who is Therapist, sports     Counseling given: Not Answered Comment: widowed 1991 - lives with SO [name] who is Therapist, sports  Past Medical History:  Diagnosis Date  . Allergy   . Anxiety    PTSD  . Aortic stenosis, moderate 03/27/2015   post AVR echo Echo 11/16: Mild LVH, EF 55-60%, normal wall motion, grade 1 diastolic dysfunction, bioprosthetic AVR okay (mean gradient 18 mmHg) with trivial AI, mild LAE, atrial septal aneurysm, small effusion   . Arthritis    "qwhere" (10/29/2015)  . Carotid artery stenosis    Carotid US 11/17: 1-39% bilateral ICA stenosis. F/u prn  . Cervical stenosis of spinal canal    fusion 2006  . CHF (congestive heart failure) ([place])    "secondry to OHS"  . Closed left pilon fracture   . COLONIC POLYPS, HX OF   . Complication of anesthesia    "takes a lot to put him under and has woken up during surgery" Difficult to wake up . PTSD; "does not metabolize RX well"Pt. reports that he gets "violent", per pt.   [name] DIVERTICULOSIS, COLON   . Dysrhythmia    2 bouts of afib post op and at home but corrected with medication.  . Familial tremor 03/19/2014  . GERD (  gastroesophageal reflux disease)    uses Zantac  . GOUT   . Headache   . Heart murmur   . High cholesterol   . HYPERTENSION    off ACEI 2010 because of hyperkalemia in setting of ARI  . Hypothyroidism    newly diaganosed 09/2015  . Kidney stones    remote open stone-retrieval on L  . Myocardial infarction [facility])    October 20th 2016  . OSA on CPAP   . 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 PTSD (post-traumatic stress disorder)   . Sleep apnea    On a CPAP   Past Surgical  History:  Procedure Laterality Date  . ANKLE FUSION Left 03/17/2017   Procedure: FUSION  PILON FRACTURE WITH BONE GRAFTING;  Surgeon: Shona Needles, MD;  Location: [facility];  Service: Orthopedics;  Laterality: Left;  . ANTERIOR FUSION CERVICAL SPINE  2002  . AORTIC VALVE REPLACEMENT N/A 03/30/2015   Procedure: AORTIC VALVE REPLACEMENT (AVR);  Surgeon: Grace Isaac, MD;  Location: [facility];  Service: Open Heart Surgery;  Laterality: N/A;  . BACK SURGERY    . CARDIAC CATHETERIZATION N/A 03/26/2015   Procedure: Left Heart Cath and Coronary Angiography;  Surgeon: Leonie Man, MD;  Location: [facility];  Service: Cardiovascular;  Laterality: N/A;  . CARDIAC VALVE REPLACEMENT    . CORONARY ARTERY BYPASS GRAFT N/A 03/30/2015   Procedure: CORONARY ARTERY BYPASS GRAFTING (CABG) x four, using left internal mammary artery and left    leg greater saphenous vein harvested endoscopically ;  Surgeon: Grace Isaac, MD;  Location: [facility];  Service: Open Heart Surgery;  Laterality: N/A;  . CYSTOSCOPY/URETEROSCOPY/HOLMIUM LASER/STENT PLACEMENT Bilateral 11/07/2016   Procedure: CYSTOSCOPY/URETEROSCOPY/HOLMIUM LASER/STENT PLACEMENT;  Surgeon: Nickie Retort, MD;  Location: [facility];  Service: Urology;  Laterality: Bilateral;  . CYSTOSCOPY/URETEROSCOPY/HOLMIUM LASER/STENT PLACEMENT Bilateral 11/23/2016   Procedure: CYSTOSCOPY/BILATERAL URETEROSCOPY/HOLMIUM LASER/STENT EXCHANGE;  Surgeon: Nickie Retort, MD;  Location: [facility];  Service: Urology;  Laterality: Bilateral;  NEEDS 90 MIN   . EXTERNAL FIXATION LEG Left 02/22/2017   Procedure: EXTERNAL FIXATION LEFT ANKLE;  Surgeon: Renette Butters, MD;  Location: [facility];  Service: Orthopedics;  Laterality: Left;  . HEMORRHOID BANDING  1970s  . INGUINAL HERNIA REPAIR Left 1962  . JOINT REPLACEMENT    . [place]   "cut me open"  . LEFT HEART CATH AND CORS/GRAFTS ANGIOGRAPHY N/A 10/25/2018   Procedure: LEFT HEART CATH AND CORS/GRAFTS  ANGIOGRAPHY;  Surgeon: Leonie Man, MD;  Location: [facility];  Service: Cardiovascular;  Laterality: N/A;  . POSTERIOR FUSION CERVICAL SPINE  2006  . TEE WITHOUT CARDIOVERSION N/A 03/30/2015   Procedure: TRANSESOPHAGEAL ECHOCARDIOGRAM (TEE);  Surgeon: Grace Isaac, MD;  Location: [facility];  Service: Open Heart Surgery;  Laterality: N/A;  . TESTICLE TORSION REDUCTION Right 1960  . TOE FUSION Left 1985 & 2004   "great toe" and removal  . TONSILLECTOMY    . TOTAL HIP ARTHROPLASTY Right 10/27/2015   Procedure: RIGHT TOTAL HIP ARTHROPLASTY ANTERIOR APPROACH and steroid injection right foot;  Surgeon: Mcarthur Rossetti, MD;  Location: [facility];  Service: Orthopedics;  Laterality: Right;   Family History  Problem Relation Age of Onset  . Heart disease Mother   . Hypertension Mother   . Diabetes Mother   . Heart disease Father   . Hypertension Father   .  Tremor Sister   . Healthy Brother   . Healthy Son   . Prostate cancer Maternal Uncle   . Hypertension Maternal Grandmother   . Heart disease Maternal Grandmother   . Hypertension Maternal Grandfather   . Heart disease Maternal Grandfather   . Hypertension Paternal Grandfather   . Heart disease Paternal Grandfather   . Diabetes Paternal Grandfather   . Alcohol abuse Other   . Arthritis Other   . Drug abuse Sister   . Healthy Daughter   . Colon polyps Neg Hx   . Esophageal cancer Neg Hx   . Rectal cancer Neg Hx   . Stomach cancer Neg Hx   . Colon cancer Neg Hx    Social History   Socioeconomic History  . Marital status: Married    Spouse name: Karmen Bongo, RN  . Number of children: 2  . Years of education: Not on file  . Highest education level: Some college, no degree  Occupational History  . Occupation: Retired Wellsite geologist  . Financial resource strain: Not hard at all  . Food insecurity    Worry: Never true    Inability: Never true  . Transportation needs    Medical: Yes     Non-medical: Yes  Tobacco Use  . Smoking status: Former Smoker    Packs/day: 1.00    Years: 34.00    Pack years: 34.00    Types: Cigarettes    Quit date: 06/06/1990    Years since quitting: 28.8  . Smokeless tobacco: Never Used  . Tobacco comment: widowed 1991 - lives with SO [name] who is RN  Substance and Sexual Activity  . Alcohol use: Yes    Alcohol/week: 1.0 standard drinks    Types: 1 Glasses of Auden Wettstein per week    Comment: occasional  . Drug use: No  . Sexual activity: Yes  Lifestyle  . Physical activity    Days per week: Not on file    Minutes per session: Not on file  . Stress: Not at all  Relationships  . Social Herbalist on phone: Not on file    Gets together: Not on file    Attends religious service: Not on file    Active member of club or organization: Not on file    Attends meetings of clubs or organizations: Not on file    Relationship status: Not on file  Other Topics Concern  . Not on file  Social History Narrative  . Not on file    Outpatient Encounter Medications as of 04/01/2019  Medication Sig  . acetaminophen (TYLENOL) 500 MG tablet Take 2 tablets (1,000 mg total) by mouth every 6 (six) hours.  [name] allopurinol (ZYLOPRIM) 300 MG tablet TAKE 1 TABLET BY MOUTH EVERY DAY  . amLODipine (NORVASC) 10 MG tablet TAKE 1 TABLET BY MOUTH EVERY DAY  . Ascorbic Acid (VITAMIN C) 1000 MG tablet Take 1,000 mg by mouth 2 (two) times daily.   [name] aspirin 81 MG chewable tablet Chew 81 mg by mouth every evening.   . calcium carbonate (TUMS EX) 750 MG chewable tablet Chew 1 tablet by mouth daily.  . Carboxymethylcellulose Sodium (LUBRICANT EYE DROPS OP) Place 1 drop into both eyes 2 (two) times daily as needed (dry/irritated eyes.).   [name] Cholecalciferol (VITAMIN D-3) 125 MCG (5000 UT) TABS Take 5,000 Units by mouth 2 (two) times a day.  . famotidine (PEPCID) 40 MG tablet Take 1 tablet (40 mg  total) by mouth daily.  . furosemide (LASIX) 40 MG tablet TAKE 1 TABLET BY  MOUTH EVERY OTHER DAY  . ibuprofen (ADVIL) 200 MG tablet Take 400 mg by mouth every 8 (eight) hours as needed (for pain.).  [name] levothyroxine (SYNTHROID) 25 MCG tablet TAKE 1 TABLET BY MOUTH EVERY DAY BEFORE BREAKFAST (Patient taking differently: Take 25 mcg by mouth daily before breakfast. )  . nitroGLYCERIN (NITROSTAT) 0.4 MG SL tablet PLACE 1 TABLET UNDER THE TONGUE EVERY 5 MINUTES AS NEEDED FOR CHEST PAIN.  [name] pantoprazole (PROTONIX) 40 MG tablet Take 1 tablet (40 mg total) by mouth daily.  . primidone (MYSOLINE) 50 MG tablet TAKE FOUR TABLETS TWO TIMES A DAY  . rosuvastatin (CRESTOR) 10 MG tablet Take 0.5 tablets (5 mg total) by mouth daily. (Patient taking differently: Take 5 mg by mouth every evening. )  . tiZANidine (ZANAFLEX) 4 MG tablet Take 1 tablet (4 mg total) by mouth every 8 (eight) hours as needed for muscle spasms.  . Turmeric 500 MG CAPS Take 500 mg by mouth 2 (two) times daily.   . vitamin B-12 (CYANOCOBALAMIN) 1000 MCG tablet Take 1,000 mcg by mouth 2 (two) times daily.   . [DISCONTINUED] polyethylene glycol (MIRALAX / GLYCOLAX) packet Take 17 g by mouth daily. (Patient not taking: Reported on 04/01/2019)  . [DISCONTINUED] ranitidine (ZANTAC) 150 MG tablet TAKE 1 TABLET BY MOUTH 2 TIMES DAILY (Patient taking differently: Take 150 mg by mouth 2 (two) times a day. )   Facility-Administered Encounter Medications as of 04/01/2019  Medication  . sodium chloride flush (NS) 0.9 % injection 3 mL    Activities of Daily Living In your present state of health, do you have any difficulty performing the following activities: 04/01/2019  Hearing? N  Vision? N  Difficulty concentrating or making decisions? N  Walking or climbing stairs? N  Dressing or bathing? N  Doing errands, shopping? N  Preparing Food and eating ? N  Using the Toilet? N  In the past six months, have you accidently leaked urine? N  Do you have problems with loss of bowel control? N  Managing your Medications? N   Managing your Finances? N  Housekeeping or managing your Housekeeping? N  Some recent data might be hidden    Patient Care Team: [name], MD as PCP - General (Internal Medicine) [name], [name], MD as PCP - Cardiology (Cardiology) [name], [name], MD as Consulting Physician (Cardiology) Mcarthur Rossetti, MD as Consulting Physician (Orthopedic Surgery) Gardiner Barefoot, DPM as Consulting Physician (Podiatry) Jerry Caras, MD as Referring Physician [name], [name], DO as Consulting Physician (Neurology)   Assessment:   This is a routine wellness examination for Troy Banks  Exercise Activities and Dietary recommendations Current Exercise Habits: The patient does not participate in regular exercise at present  Diet (meal preparation, eat out, water intake, caffeinated beverages, dairy products, fruits and vegetables): in general, a "healthy" diet  , well balanced. eats a variety of fruits and vegetables daily, limits salt, fat/cholesterol, sugar,carbohydrates,caffeine, drinks 6-8 glasses of water daily.  Goals    . Exercise 150 minutes per week (moderate activity)     Will start silver sneaker program Will start water aerobics at lest x2 per week;     . Patient Stated     I want to lose weight        Fall Risk Fall Risk  04/01/2019 01/24/2019 11/02/2018 07/25/2018 01/22/2018  Falls in the past year? 0 0 0 0  Yes  Number falls in past yr: 0 0 - 0 1  Injury with Fall? 0 0 - 0 No  Risk Factor Category  - - - - -  Risk for fall due to : - - - - -  Follow up - - - Falls evaluation completed Falls evaluation completed    Depression Screen PHQ 2/9 Scores 04/01/2019 11/02/2018 10/27/2017 10/26/2016  PHQ - 2 Score 0 1 1 0  PHQ- 9 Score - - - 3    Cognitive Function     6CIT Screen 04/01/2019  What Year? 0 points  What month? 0 points  What time? 0 points  Count back from 20 0 points  Months in reverse 0 points  Repeat phrase 0 points  Total Score 0    Immunization  History  Administered Date(s) Administered  . DT 06/06/2008  . Pneumococcal Conjugate-13 09/08/2014  . Pneumococcal Polysaccharide-23 10/27/2017  . Td 06/06/2008  . Tdap 11/02/2018      Screening Tests Health Maintenance  Topic Date Due  . URINE MICROALBUMIN  11/01/2018  . INFLUENZA VACCINE  09/05/2019 (Originally 01/05/2019)  . HEMOGLOBIN A1C  05/05/2019  . FOOT EXAM  11/02/2019  . OPHTHALMOLOGY EXAM  03/10/2020  . COLONOSCOPY  03/13/2021  . TETANUS/TDAP  11/01/2028  . PNA vac Low Risk Adult  Completed        Plan:     I have personally reviewed and noted the following in the patient's chart:   . Medical and social history . Use of alcohol, tobacco or illicit drugs  . Current medications and supplements . Functional ability and status . Nutritional status . Physical activity . Advanced directives . List of other physicians . Screenings to include cognitive, depression, and falls . Referrals and appointments  In addition, I have reviewed and discussed with patient certain preventive protocols, quality metrics, and best practice recommendations. A written personalized care plan for preventive services as well as general preventive health recommendations were provided to patient.     Michiel Cowboy, RN  04/01/2019 Medical screening examination/treatment/procedure(s) were performed by non-physician practitioner and as supervising physician I was immediately available for consultation/collaboration. I agree with above. Lew Dawes, MD

## 2019-04-08 ENCOUNTER — Other Ambulatory Visit: Payer: Self-pay | Admitting: Cardiology

## 2019-04-08 ENCOUNTER — Other Ambulatory Visit: Payer: Self-pay | Admitting: Internal Medicine

## 2019-04-30 ENCOUNTER — Ambulatory Visit: Payer: Medicare Other | Admitting: Orthopaedic Surgery

## 2019-05-01 ENCOUNTER — Ambulatory Visit: Payer: Self-pay

## 2019-05-01 ENCOUNTER — Encounter: Payer: Self-pay | Admitting: Orthopaedic Surgery

## 2019-05-01 ENCOUNTER — Other Ambulatory Visit: Payer: Self-pay

## 2019-05-01 ENCOUNTER — Ambulatory Visit: Payer: Medicare Other | Admitting: Orthopaedic Surgery

## 2019-05-01 VITALS — Ht 68.0 in | Wt 211.0 lb

## 2019-05-01 DIAGNOSIS — M1612 Unilateral primary osteoarthritis, left hip: Secondary | ICD-10-CM

## 2019-05-01 NOTE — Progress Notes (Signed)
Office Visit Note   Patient: Troy Banks           Date of Birth: [birth year]           MRN: [id number] Visit Date: 05/01/2019              Requested by: [name], MD [place],  [place] [address] PCP: [name], MD   Assessment & Plan: Visit Diagnoses:  1. Primary osteoarthritis of left hip     Plan: At this point time he states the pain is bearable in the left hip.  He will call us if the pain begins that affect his activities daily living.  Questions encouraged and answered at length.  Follow-Up Instructions: Return if symptoms worsen or fail to improve.   Orders:  Orders Placed This Encounter  Procedures  . XR HIP UNILAT W OR W/O PELVIS 1V LEFT   No orders of the defined types were placed in this encounter.     Procedures: No procedures performed   Clinical Data: No additional findings.   Subjective: Chief Complaint  Patient presents with  . Left Hip - Pain    HPI [name] comes in today due to left hip pain.  States been ongoing for at least few months.  No known injury.  No pain pain within the groin.  But states pain is similar to what he had in his right hip for having right total hip arthroplasty back in May 2017.  Right hip is overall doing well. Review of Systems Negative for fevers chills shortness of breath chest pain.  Objective: Vital Signs: Ht 5\' 8"  (1.727 m)   Wt 211 lb (95.7 kg)   BMI 32.08 kg/m   Physical Exam Constitutional:      Appearance: He is not ill-appearing or diaphoretic.  Pulmonary:     Effort: Pulmonary effort is normal.  Neurological:     Mental Status: He is oriented to person, place, and time.     Ortho Exam Left hip no internal rotation.  Limited external rotation.  Internal and external rotation causes severe pain.  Left calf supple nontender.  Right hip excellent range of motion without pain. Specialty Comments:  No specialty comments available.  Imaging: Xr Hip Unilat W Or  W/o Pelvis 1v Left  Result Date: 05/01/2019 AP pelvis and lateral view of left hip: Shows near bone on bone arthritis left hip with considerable joint narrowing and cystic changes within the femoral head.  Status post right total hip arthroplasty with well-seated components visualized on the AP pelvis.  No bony abnormalities or acute fractures.    PMFS History: Patient Active Problem List   Diagnosis Date Noted  . Abdominal wall hernia 11/02/2018  . Progressive angina ([place]) 10/25/2018  . DOE (dyspnea on exertion) 10/25/2018  . Abscess of right buttock 11/27/2017  . Acute sinus infection 11/05/2017  . Pilon fracture 03/17/2017  . Pilon fracture of left tibia 03/17/2017  . At risk for adverse drug reaction 02/28/2017  . Closed fracture dislocation of ankle joint, left, with delayed healing, subsequent encounter 02/22/2017  . Renal stone 10/26/2016  . Gross hematuria 10/26/2016  . Bladder neck obstruction 10/26/2016  . External hemorrhoid, thrombosed 06/10/2016  . Internal hemorrhoids 06/10/2016  . Lumbar back pain 05/19/2016  . Osteoarthritis of right hip 10/27/2015  . Encounter for well adult exam with abnormal findings 10/27/2015  . Chronic chest pain 09/22/2015  . Acute gouty arthritis 06/11/2015  .  Coronary artery disease involving native coronary artery with angina pectoris ([place]) 06/11/2015  . Chronic diastolic CHF (congestive heart failure) ([place]) 06/11/2015  . H/O tissue AVR Oct 2016 06/10/2015  . PAF post CABG/AVR- Amiodarone 06/10/2015  . Dyslipidemia 06/10/2015  . S/P CABG x 4-Oct 2016 03/30/2015  . H/O NSTEMI-Oct 2016 03/27/2015  . 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 Depression 09/08/2014  . GERD (gastroesophageal reflux disease)   . Essential tremor 03/31/2014  . Iliotibial band syndrome affecting left lower leg 01/01/2014  . Spondylolisthesis at L4-L5 level 01/01/2014  . Sinus bradycardia 12/31/2013  . Greater trochanteric  bursitis of right hip 12/04/2013  . OSA on CPAP   . Diet-controlled diabetes mellitus ([place]) 07/08/2009  . COLONIC POLYPS, HX OF 12/31/2008  . GOUT 12/22/2008  . Essential hypertension 12/22/2008  . Diverticulosis of colon 12/22/2008  . NEPHROLITHIASIS, HX OF 12/22/2008   Past Medical History:  Diagnosis Date  . Allergy   . Anxiety    PTSD  . Aortic stenosis, moderate 03/27/2015   post AVR echo Echo 11/16: Mild LVH, EF 55-60%, normal wall motion, grade 1 diastolic dysfunction, bioprosthetic AVR okay (mean gradient 18 mmHg) with trivial AI, mild LAE, atrial septal aneurysm, small effusion   . Arthritis    "qwhere" (10/29/2015)  . Carotid artery stenosis    Carotid US 11/17: 1-39% bilateral ICA stenosis. F/u prn  . Cervical stenosis of spinal canal    fusion 2006  . CHF (congestive heart failure) ([place])    "secondry to OHS"  . Closed left pilon fracture   . COLONIC POLYPS, HX OF   . Complication of anesthesia    "takes a lot to put him under and has woken up during surgery" Difficult to wake up . PTSD; "does not metabolize RX well"Pt. reports that he gets "violent", per pt.   [name] DIVERTICULOSIS, COLON   . Dysrhythmia    2 bouts of afib post op and at home but corrected with medication.  . Familial tremor 03/19/2014  . GERD (gastroesophageal reflux disease)    uses Zantac  . GOUT   . Headache   . Heart murmur   . High cholesterol   . HYPERTENSION    off ACEI 2010 because of hyperkalemia in setting of ARI  . Hypothyroidism    newly diaganosed 09/2015  . Kidney stones    remote open stone-retrieval on L  . Myocardial infarction [facility])    October 20th 2016  . OSA on CPAP   . 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 PTSD (post-traumatic stress disorder)   . Sleep apnea    On a CPAP    Family History  Problem Relation Age of Onset  . Heart disease Mother   . Hypertension Mother   . Diabetes Mother   . Heart  disease Father   . Hypertension Father   . Tremor Sister   . Healthy Brother   . Healthy Son   . Prostate cancer Maternal Uncle   . Hypertension Maternal Grandmother   . Heart disease Maternal Grandmother   . Hypertension Maternal Grandfather   . Heart disease Maternal Grandfather   . Hypertension Paternal Grandfather   . Heart disease Paternal Grandfather   . Diabetes Paternal Grandfather   . Alcohol abuse Other   . Arthritis Other   . Drug abuse Sister   . Healthy Daughter   .  Colon polyps Neg Hx   . Esophageal cancer Neg Hx   . Rectal cancer Neg Hx   . Stomach cancer Neg Hx   . Colon cancer Neg Hx     Past Surgical History:  Procedure Laterality Date  . ANKLE FUSION Left 03/17/2017   Procedure: FUSION  PILON FRACTURE WITH BONE GRAFTING;  Surgeon: Shona Needles, MD;  Location: [place];  Service: Orthopedics;  Laterality: Left;  . ANTERIOR FUSION CERVICAL SPINE  2002  . AORTIC VALVE REPLACEMENT N/A 03/30/2015   Procedure: AORTIC VALVE REPLACEMENT (AVR);  Surgeon: Grace Isaac, MD;  Location: [facility];  Service: Open Heart Surgery;  Laterality: N/A;  . BACK SURGERY    . CARDIAC CATHETERIZATION N/A 03/26/2015   Procedure: Left Heart Cath and Coronary Angiography;  Surgeon: Leonie Man, MD;  Location: [facility];  Service: Cardiovascular;  Laterality: N/A;  . CARDIAC VALVE REPLACEMENT    . CORONARY ARTERY BYPASS GRAFT N/A 03/30/2015   Procedure: CORONARY ARTERY BYPASS GRAFTING (CABG) x four, using left internal mammary artery and left    leg greater saphenous vein harvested endoscopically ;  Surgeon: Grace Isaac, MD;  Location: [facility];  Service: Open Heart Surgery;  Laterality: N/A;  . CYSTOSCOPY/URETEROSCOPY/HOLMIUM LASER/STENT PLACEMENT Bilateral 11/07/2016   Procedure: CYSTOSCOPY/URETEROSCOPY/HOLMIUM LASER/STENT PLACEMENT;  Surgeon: Nickie Retort, MD;  Location: [facility];  Service: Urology;  Laterality: Bilateral;  . CYSTOSCOPY/URETEROSCOPY/HOLMIUM  LASER/STENT PLACEMENT Bilateral 11/23/2016   Procedure: CYSTOSCOPY/BILATERAL URETEROSCOPY/HOLMIUM LASER/STENT EXCHANGE;  Surgeon: Nickie Retort, MD;  Location: [facility];  Service: Urology;  Laterality: Bilateral;  NEEDS 90 MIN   . EXTERNAL FIXATION LEG Left 02/22/2017   Procedure: EXTERNAL FIXATION LEFT ANKLE;  Surgeon: Renette Butters, MD;  Location: [facility];  Service: Orthopedics;  Laterality: Left;  . HEMORRHOID BANDING  1970s  . INGUINAL HERNIA REPAIR Left 1962  . JOINT REPLACEMENT    . [place]   "cut me open"  . LEFT HEART CATH AND CORS/GRAFTS ANGIOGRAPHY N/A 10/25/2018   Procedure: LEFT HEART CATH AND CORS/GRAFTS ANGIOGRAPHY;  Surgeon: Leonie Man, MD;  Location: [facility];  Service: Cardiovascular;  Laterality: N/A;  . POSTERIOR FUSION CERVICAL SPINE  2006  . TEE WITHOUT CARDIOVERSION N/A 03/30/2015   Procedure: TRANSESOPHAGEAL ECHOCARDIOGRAM (TEE);  Surgeon: Grace Isaac, MD;  Location: [facility];  Service: Open Heart Surgery;  Laterality: N/A;  . TESTICLE TORSION REDUCTION Right 1960  . TOE FUSION Left 1985 & 2004   "great toe" and removal  . TONSILLECTOMY    . TOTAL HIP ARTHROPLASTY Right 10/27/2015   Procedure: RIGHT TOTAL HIP ARTHROPLASTY ANTERIOR APPROACH and steroid injection right foot;  Surgeon: Mcarthur Rossetti, MD;  Location: [facility];  Service: Orthopedics;  Laterality: Right;   Social History   Occupational History  . Occupation: Retired Animal nutritionist  Tobacco Use  . Smoking status: Former Smoker    Packs/day: 1.00    Years: 34.00    Pack years: 34.00    Types: Cigarettes    Quit date: 06/06/1990    Years since quitting: 28.9  . Smokeless tobacco: Never Used  . Tobacco comment: widowed 1991 - lives with SO [name] who is RN  Substance and Sexual Activity  . Alcohol use: Yes    Alcohol/week: 1.0 standard drinks    Types: 1 Glasses of wine per week    Comment: occasional  . Drug use: No  . Sexual activity: Yes

## 2019-05-08 ENCOUNTER — Ambulatory Visit: Payer: Medicare Other | Admitting: Internal Medicine

## 2019-05-29 ENCOUNTER — Encounter: Payer: Self-pay | Admitting: Orthopaedic Surgery

## 2019-06-03 ENCOUNTER — Other Ambulatory Visit: Payer: Self-pay | Admitting: Internal Medicine

## 2019-06-03 NOTE — Telephone Encounter (Signed)
As per routine med refill policy

## 2019-06-05 ENCOUNTER — Telehealth: Payer: Self-pay

## 2019-06-05 NOTE — Telephone Encounter (Signed)
   Primary Cardiologist: [name], MD  Chart reviewed and patient contacted today by phone as part of pre-operative protocol coverage. Given past medical history and time since last visit, based on ACC/AHA guidelines, Troy Banks would be at acceptable risk for the planned procedure without further cardiovascular testing.   OK to hold aspirin 3-5 days pre op if needed.   I will route this recommendation to the requesting party via Epic fax function and remove from pre-op pool.  Please call with questions.  Kerin Ransom, [name] 06/05/2019, 11:14 AM

## 2019-06-05 NOTE — Telephone Encounter (Signed)
   [facility] Pre-operative Risk Assessment    Request for surgical clearance:  1. What type of surgery is being performed? LEFT TOTAL HIP ARTHROPLASTY  2. When is this surgery scheduled? TBD   3. What type of clearance is required (medical clearance vs. Pharmacy clearance to hold med vs. Both)? MEDICAL  4. Are there any medications that need to be held prior to surgery and how long? NONE   5. Practice name and name of physician performing surgery? [facility]   6. What is your office phone number [phone]    7.   What is your office fax number [phone]  8.   Anesthesia type (None, local, MAC, general) ? SPINAL VS GENERAL  PT HAS DR [name] APPT 06-25-2019

## 2019-06-15 ENCOUNTER — Other Ambulatory Visit: Payer: Self-pay | Admitting: Internal Medicine

## 2019-06-15 NOTE — Telephone Encounter (Signed)
Please refill as per office routine med refill policy (all routine meds refilled for 3 mo or monthly per pt preference up to one year from last visit, then month to month grace period for 3 mo, then further med refills will have to be denied)  

## 2019-06-18 ENCOUNTER — Encounter: Payer: Self-pay | Admitting: Neurology

## 2019-06-18 NOTE — Progress Notes (Addendum)
Virtual Visit via Video Note The purpose of this virtual visit is to provide medical care while limiting exposure to the novel coronavirus.    Consent was obtained for video visit:  Yes.   Answered questions that patient had about telehealth interaction:  Yes.   I discussed the limitations, risks, security and privacy concerns of performing an evaluation and management service by telemedicine. I also discussed with the patient that there may be a patient responsible charge related to this service. The patient expressed understanding and agreed to proceed.  Pt location: Home Physician Location: office Name of referring provider:  [name], MD I connected with Troy Banks at patients initiation/request on 06/19/2019 at 10:15 AM EST by video enabled telemedicine application and verified that I am speaking with the correct person using two identifiers. Pt MRN:  [id number] Pt DOB:  24-Oct-1940 Video Participants:  Troy Banks;  Wife supplements the history   History of Present Illness:  Patient seen today in follow-up for essential tremor.  Dosage of primidone has continued to increase, including last visit.  He is currently on 200 mg twice per day.  Unfortunately, tremor continues to be an issue for him.  States that he doesn't know what sets off the tremor.  Wife asks about focused ultrasound.  Medical records are reviewed since last visit.  Saw Dr. Ninfa Linden for hip pain.  Sounds like the patient initially did not want treatment, but did call back in December stating that he was ready to schedule surgery.  Current movement d/o meds:  Primidone, 50 mg, 4 tablets twice per day (200 mg twice per day)  Prior tried medications: Gabapentin (not a candidate for beta-blocker because of bradycardia; not a candidate for topiramate because of nephrolithiasis; not a candidate for anticholinergics because of loss of balance; not a candidate for DBS because of depression)   Current  Outpatient Medications on File Prior to Visit  Medication Sig Dispense Refill  . allopurinol (ZYLOPRIM) 300 MG tablet TAKE 1 TABLET BY MOUTH EVERY DAY 90 tablet 1  . amLODipine (NORVASC) 10 MG tablet TAKE 1 TABLET BY MOUTH EVERY DAY 90 tablet 1  . Ascorbic Acid (VITAMIN C) 1000 MG tablet Take 1,000 mg by mouth 2 (two) times daily.     [name] aspirin 81 MG chewable tablet Chew 81 mg by mouth every evening.     . calcium carbonate (TUMS EX) 750 MG chewable tablet Chew 1 tablet by mouth daily.    . Carboxymethylcellulose Sodium (LUBRICANT EYE DROPS OP) Place 1 drop into both eyes 2 (two) times daily as needed (dry/irritated eyes.).     [name] Cholecalciferol (VITAMIN D-3) 125 MCG (5000 UT) TABS Take 5,000 Units by mouth 2 (two) times a day.    . famotidine (PEPCID) 40 MG tablet Take 1 tablet (40 mg total) by mouth daily. 90 tablet 3  . furosemide (LASIX) 40 MG tablet TAKE 1 TABLET BY MOUTH EVERY OTHER DAY 45 tablet 1  . levothyroxine (SYNTHROID) 25 MCG tablet TAKE 1 TABLET BY MOUTH EVERY DAY BEFORE BREAKFAST 90 tablet 0  . nitroGLYCERIN (NITROSTAT) 0.4 MG SL tablet PLACE 1 TABLET UNDER THE TONGUE EVERY 5 MINUTES AS NEEDED FOR CHEST PAIN. 25 tablet 3  . pantoprazole (PROTONIX) 40 MG tablet Take 1 tablet (40 mg total) by mouth daily. 90 tablet 3  . primidone (MYSOLINE) 50 MG tablet TAKE FOUR TABLETS TWO TIMES A DAY 450 tablet 1  . rosuvastatin (CRESTOR) 10 MG tablet TAKE 1/2  TABLET BY MOUTH EVERY DAY 45 tablet 1  . tiZANidine (ZANAFLEX) 4 MG tablet Take 1 tablet (4 mg total) by mouth every 8 (eight) hours as needed for muscle spasms. 60 tablet 0  . Turmeric 500 MG CAPS Take 500 mg by mouth 2 (two) times daily.     . vitamin B-12 (CYANOCOBALAMIN) 1000 MCG tablet Take 1,000 mcg by mouth 2 (two) times daily.     [name] ibuprofen (ADVIL) 200 MG tablet Take 400 mg by mouth every 8 (eight) hours as needed (for pain.).    . [DISCONTINUED] ranitidine (ZANTAC) 150 MG tablet TAKE 1 TABLET BY MOUTH 2 TIMES DAILY (Patient  taking differently: Take 150 mg by mouth 2 (two) times a day. ) 180 tablet 0   Current Facility-Administered Medications on File Prior to Visit  Medication Dose Route Frequency Provider Last Rate Last Admin  . sodium chloride flush (NS) 0.9 % injection 3 mL  3 mL Intravenous Q12H Almyra Deforest, PA         Observations/Objective:   Vitals:   06/18/19 1500  BP: 134/72   GEN:  The patient appears stated age and is in NAD.    Neurological examination:  Orientation: The patient is alert and oriented x3.  I have reviewed and interpreted the following labs independently     Chemistry      Component Value Date/Time   NA 143 10/19/2018 1411   K 4.3 10/19/2018 1411   CL 104 10/19/2018 1411   CO2 23 10/19/2018 1411   BUN 17 10/19/2018 1411   CREATININE 0.92 10/19/2018 1411   CREATININE 0.94 03/04/2016 0918      Component Value Date/Time   CALCIUM 9.4 10/19/2018 1411   ALKPHOS 56 10/31/2017 1528   AST 14 10/31/2017 1528   ALT 12 10/31/2017 1528   BILITOT 0.9 10/31/2017 1528     Lab Results  Component Value Date   TSH 4.75 (H) 11/02/2018     Assessment and Plan:   1.  Essential tremor, not adequately controlled  -On primidone, 50 mg, 4 tablets twice per day.  Going up on this any higher really is probably not going to be beneficial, unfortunately.  The maximum per day that likely would offer any benefit is 500 mg, but he has not seen benefit as we have gone up recently.  I did offer him to go up, as his wife asked about it, but the patient declined.  -As above, tremor not adequately controlled, but patient is not candidate for other medications.  Not a candidate for beta-blocker because of bradycardia.  Not a candidate for anticholinergics because of balance issues.  Not a candidate for DBS because of depression (refuses treatment).  We discussed this extensively, as the patient asked again today why he was not a candidate for DBS.  I offered a referral to psychiatry, which he  declined.  He became somewhat frustrated/upset when we discussed this.  Discussed that we would be happy to consider DBS if mood was under better control, but we did not want him to come out of DBS in a worse place in terms of mood than he went into DBS.  Not a candidate for Topamax because of history of nephrolithiasis.   -Patient's wife asked me about focused ultrasound again.  We have discussed this previously.  Discussed that this would have to be done at either [facility] or in [place].  They felt that [facility] would be much better, as his daughter is a physician  in [place].  Offered a referral, but the patient declined.  Addendum:  When my office called pt to give f/u appt he requested referral to Dr. Clovis Pu.  Will refer  2.  Left hip pain  -Patient planning to have left hip replacement once able (surgeries may be suspended b/c of pandemic)   Follow Up Instructions: 6 months   -I discussed the assessment and treatment plan with the patient. The patient was provided an opportunity to ask questions and all were answered. The patient agreed with the plan and demonstrated an understanding of the instructions.   The patient was advised to call back or seek an in-person evaluation if the symptoms worsen or if the condition fails to improve as anticipated.    Total time spent on today's visit was 30 minutes, including both face-to-face time and nonface-to-face time.  Time included that spent on review of records (prior notes available to me/labs/imaging if pertinent), discussing treatment and goals, answering patient's questions and coordinating care.   Alonza Bogus, DO

## 2019-06-18 NOTE — Progress Notes (Signed)
06/24/2019 Troy Banks   10/06/1940  [id number]  Primary Physician [name], MD Primary Cardiologist: Dr Roxsana Riding [name]  HPI:  79 y.o. Male seen for follow up CAD. He was admitted 10/19-10/31/16 with a non-STEMI. LHC demonstrated 3 vessel CAD. He also had significant aortic stenosis.  He underwent CABG with LIMA-LAD, SVG-intermediate, SVG-LCx, SVG-distal RCA and bioprosthetic AVR. Atenolol was initiated but later stopped secondary to bradycardia. He did develop atrial fibrillation. He was placed on amiodarone with return to NSR.  He has an essential tremor and is on Primidone chronically. 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 Amiodarone was stopped in January 2017 at the time he was hospitalized for an acute gout attack in his [place]. He has maintained NSR.  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 He has a daughter who is a family physician in [place].  In May 2018 he complained of more dyspnea on exertion and some chest tightness.  CXR was clear. Myoview study was normal.  In June 2018 he underwent cystoscopy and lithotripsy for renal stones.   In September 2018 he fell off his roof fracturing his left ankle and had a compression fracture of L2. He required extensive surgery of his ankle.   In May 2020 he presented with symptoms of increased DOE. He underwent cardiac cath showing all grafts were patent. Echo showed normal EF with evidence of diastolic dysfunction. The AV prothesis function had not changed. He had mild pulmonary HTN. His diuretic dose was increased.  On follow up today he is doing well. He does have white coat syndrome. Usually BP at home is 130/70-80.  His breathing is doing well. No chest pain.  He is planning to have a left hip replacement.  His depression is doing better. With inactivity he has gained 12 lbs.    Current Outpatient Medications  Medication Sig Dispense Refill  . allopurinol  (ZYLOPRIM) 300 MG tablet TAKE 1 TABLET BY MOUTH EVERY DAY 90 tablet 1  . amLODipine (NORVASC) 10 MG tablet TAKE 1 TABLET BY MOUTH EVERY DAY 90 tablet 1  . Ascorbic Acid (VITAMIN C) 1000 MG tablet Take 1,000 mg by mouth 2 (two) times daily.     [name] aspirin 81 MG chewable tablet Chew 81 mg by mouth every evening.     . calcium carbonate (TUMS EX) 750 MG chewable tablet Chew 1 tablet by mouth daily.    . Carboxymethylcellulose Sodium (LUBRICANT EYE DROPS OP) Place 1 drop into both eyes 2 (two) times daily as needed (dry/irritated eyes.).     [name] Cholecalciferol (VITAMIN D-3) 125 MCG (5000 UT) TABS Take 5,000 Units by mouth 2 (two) times a day.    . famotidine (PEPCID) 40 MG tablet Take 1 tablet (40 mg total) by mouth daily. 90 tablet 3  . furosemide (LASIX) 40 MG tablet TAKE 1 TABLET BY MOUTH EVERY OTHER DAY 45 tablet 1  . ibuprofen (ADVIL) 200 MG tablet Take 400 mg by mouth every 8 (eight) hours as needed (for pain.).    [name] levothyroxine (SYNTHROID) 25 MCG tablet TAKE 1 TABLET BY MOUTH EVERY DAY BEFORE BREAKFAST 90 tablet 0  . nitroGLYCERIN (NITROSTAT) 0.4 MG SL tablet PLACE 1 TABLET UNDER THE TONGUE EVERY 5 MINUTES AS NEEDED FOR CHEST PAIN. 25 tablet 3  . pantoprazole (PROTONIX) 40 MG tablet Take 1 tablet (40 mg total) by mouth daily. 90 tablet 3  . primidone (MYSOLINE) 50  MG tablet TAKE FOUR TABLETS TWO TIMES A DAY 450 tablet 1  . rosuvastatin (CRESTOR) 10 MG tablet TAKE 1/2 TABLET BY MOUTH EVERY DAY 45 tablet 1  . tiZANidine (ZANAFLEX) 4 MG tablet Take 1 tablet (4 mg total) by mouth every 8 (eight) hours as needed for muscle spasms. 60 tablet 0  . Turmeric 500 MG CAPS Take 500 mg by mouth 2 (two) times daily.     . vitamin B-12 (CYANOCOBALAMIN) 1000 MCG tablet Take 1,000 mcg by mouth 2 (two) times daily.      Current Facility-Administered Medications  Medication Dose Route Frequency Provider Last Rate Last Admin  . sodium chloride flush (NS) 0.9 % injection 3 mL  3 mL Intravenous Q12H Almyra Deforest, PA         Allergies  Allergen Reactions  . Benzodiazepines Other (See Comments)    Patient states he gets stroke symptoms with benzo's.   . Coreg [Carvedilol] Other (See Comments)    Accumulates and causes stroke-like side-effects due to cytochrome P450 enzyme deficiency Patient poor metabolizer of CYP2D6 - Coreg undergoes extensive hepatic (including 2D6)  . Lisinopril Other (See Comments)    Hyperkalemia. 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  . Mobic [Meloxicam] Other (See Comments)    Accumulates and causes stroke-like side-effects due to cytochrome P450 enzyme deficiency  . Sertraline Hcl Other (See Comments)    Accumulates and causes stroke-like side-effects due to cytochrome P450 enzyme deficiency  . Tramadol Other (See Comments)    Accumulates and causes stroke-like side-effects due to cytochrome P450 enzyme deficiency  . Atorvastatin Other (See Comments)    MYALGIAS  . Colchicine Other (See Comments)    ataxia  . Compazine [Prochlorperazine Maleate] Other (See Comments)    Accumulates and causes stroke-like side-effects due to cytochrome P450 enzyme deficiency  . Flomax [Tamsulosin Hcl]     Accumulates and causes stroke-like side-effects due to cytochrome P450 enzyme deficiency  . Ondansetron Other (See Comments)    Accumulates and causes stroke-like side-effects due to cytochrome P450 enzyme deficiency   . Promethazine Other (See Comments)    Accumulates and causes stroke-like side-effects due to cytochrome P450 enzyme deficiency  . Acyclovir And Related Other (See Comments)    Accumulates and causes stroke-like side-effects due to cytochrome P450 enzyme deficiency    Social History   Socioeconomic History  . Marital status: Married    Spouse name: Karmen Bongo, RN  . Number of children: 2  . Years of education: Not on file  . Highest education level: Some college, no degree  Occupational History  . Occupation:  Retired Animal nutritionist  Tobacco Use  . Smoking status: Former Smoker    Packs/day: 1.00    Years: 34.00    Pack years: 34.00    Types: Cigarettes    Quit date: 06/06/1990    Years since quitting: 29.0  . Smokeless tobacco: Never Used  . Tobacco comment: widowed 1991 - lives with SO [name] who is RN  Substance and Sexual Activity  . Alcohol use: Yes    Alcohol/week: 1.0 standard drinks    Types: 1 Glasses of wine per week    Comment: occasional  . Drug use: No  . Sexual activity: Yes  Other Topics Concern  . Not on file  Social History Narrative  . Not on file   Social Determinants of Health   Financial Resource Strain: Low Risk   . Difficulty of Paying Living Expenses: Not  hard at all  Food Insecurity: No Food Insecurity  . Worried About Charity fundraiser in the Last Year: Never true  . Ran Out of Food in the Last Year: Never true  Transportation Needs: Unmet Transportation Needs  . Lack of Transportation (Medical): Yes  . Lack of Transportation (Non-Medical): Yes  Physical Activity:   . Days of Exercise per Week: Not on file  . Minutes of Exercise per Session: Not on file  Stress: No Stress Concern Present  . Feeling of Stress : Not at all  Social Connections:   . Frequency of Communication with Friends and Family: Not on file  . Frequency of Social Gatherings with Friends and Family: Not on file  . Attends Religious Services: Not on file  . Active Member of Clubs or Organizations: Not on file  . Attends Archivist Meetings: Not on file  . Marital Status: Not on file  Intimate Partner Violence:   . Fear of Current or Ex-Partner: Not on file  . Emotionally Abused: Not on file  . Physically Abused: Not on file  . Sexually Abused: Not on file     Review of Systems: As noted in HPI All other systems reviewed and are otherwise negative except as noted above.    Blood pressure (!) 168/74, pulse (!) 59, height 5\' 8"  (1.727 m), weight 223 lb (101.2  kg), SpO2 98 %.  GENERAL:  Well appearing WM in NAD HEENT:  PERRL, EOMI, sclera are clear. Oropharynx is clear. NECK:  No jugular venous distention, carotid upstroke brisk and symmetric, no bruits, no thyromegaly or adenopathy LUNGS:  Clear to auscultation bilaterally CHEST:  Unremarkable, old sternotomy scar HEART:  RRR,  PMI not displaced or sustained,S1 and S2 within normal limits, no S3, no S4: no clicks, no rubs, gr 2/6 systolic murmur RUSB.  ABD:  Soft, nontender. BS +, no masses or bruits. No hepatomegaly, no splenomegaly EXT:  2 + pulses throughout, Tr edema on left- left calf is larger than right., no cyanosis no clubbing SKIN:  Warm and dry.  No rashes NEURO:  Alert and oriented x 3. Cranial nerves II through XII intact. PSYCH:  Cognitively intact    Lab Results  Component Value Date   WBC 6.0 10/19/2018   HGB 15.9 10/19/2018   HCT 46.9 10/19/2018   PLT 146 (L) 10/19/2018   GLUCOSE 85 10/19/2018   CHOL 129 11/02/2018   TRIG 97.0 11/02/2018   HDL 44.50 11/02/2018   LDLDIRECT 100.0 09/08/2014   LDLCALC 65 11/02/2018   ALT 12 10/31/2017   AST 14 10/31/2017   NA 143 10/19/2018   K 4.3 10/19/2018   CL 104 10/19/2018   CREATININE 0.92 10/19/2018   BUN 17 10/19/2018   CO2 23 10/19/2018   TSH 4.75 (H) 11/02/2018   PSA 1.39 11/02/2018   INR 1.47 03/30/2015   HGBA1C 5.9 11/02/2018   MICROALBUR 4.3 (H) 10/31/2017    Echo 04/24/15: Study Conclusions  - Left ventricle: The cavity size was normal. Wall thickness was   increased in a pattern of mild LVH. Systolic function was normal.   The estimated ejection fraction was in the range of 55% to 60%.   Wall motion was normal; there were no regional wall motion   abnormalities. Doppler parameters are consistent with abnormal   left ventricular relaxation (grade 1 diastolic dysfunction). - Aortic valve: A bioprosthesis was present. There was trivial   regurgitation. - Left atrium: The atrium was mildly dilated. -  Atrial  septum: There was an atrial septal aneurysm. - Pericardium, extracardiac: A small pericardial effusion was   identified.  Impressions:  - Normal LV systolic function; grade 1 diastolic dysfunction; mild   LVH; s/p AVR with trace AI and mean gradient of 18 mmHg; mild   LAE; trace MR.  Myoview 10/22/16: Study Highlights     The left ventricular ejection fraction is mildly decreased (45-54%).  Nuclear stress EF: 53%.  No T wave inversion was noted during stress.  There was no ST segment deviation noted during stress.  This is a low risk study.   No reversible ischemia. LVEF 53% with normal wall motion. This is a low risk study.    Cardiac cath: 10/25/18:  LEFT HEART CATH AND CORS/GRAFTS ANGIOGRAPHY  Conclusion    Ost LAD to Prox LAD lesion is 80% stenosed. Mid LAD lesion is 100% stenosed -after second septal branch which coursed is a tandem LAD  LIMA-dLAD graft was visualized by angiography and is normal in caliber. The graft exhibits no disease.  Ost Ramus-1 lesion is 90% stenosed. Ost Ramus-2 lesion is 60% stenosed (shortly after lateral branch).  SVG-Ramus graft was visualized by angiography and is large. The graft exhibits minimal luminal irregularities.  Ost Cx lesion is 80% stenosed. Prox Cx to Mid Cx lesion is 80% stenosed. Mid Cx lesion is 60% stenosed.  SVG-dCx graft was visualized by angiography and is large. The graft exhibits minimal luminal irregularities. Retrograde filling of OM 2 and AV groove circumflex  Prox RCA to Mid RCA lesion is 100% stenosed. This is progression from 99% stenosis pre-CABG.  SVG-dRCA graft was visualized by angiography and is large. The graft exhibits no disease.  Unable to cross aortic valve prosthesis.   SUMMARY  Severe native CAD essentially unchanged from post CABG with exception of now 100% occluded RCA.  4 out of 4 patent grafts: LIMA-LAD, SVG-RI (D1), SVG-dCx, SVG-D RCA  Systemic Hypertension  Angiographically  small aortic root/stent prosthetic valve.  Unable to cross.   RECOMMENDATIONS  Discharge home after bedrest and hydration  Consider 2D echocardiogram to assess for restenosis of aortic valve  Pending echocardiographic evaluation, would consider non-anginal etiology -> with hypertension, could be related to diastolic dysfunction.   Glenetta Hew, MD   Echo 11/20/18:IMPRESSIONS    1. A 76mm an Edwards porcine bioprosthesis valve is present in the aortic position. Procedure Date: 03/30/15 Echo findings shows no evidence of regurgitation, perivalvular leak, rocking or dehiscence of the aortic prosthesis. Bioprosthetic leaflets not  well seen. Mean systolic gradient is 19 mmHg, no significant difference from gradient reported 1 month post operative in 04/2015.  2. The left ventricle has normal systolic function with an ejection fraction of 60-65%. The cavity size was normal. There is mildly increased left ventricular wall thickness. Left ventricular diastolic Doppler parameters are consistent with impaired  relaxation due to nondiagnostic images. Elevated left ventricular end-diastolic pressure The E/e' is 20.  3. The right ventricle has normal systolic function. The cavity was normal. There is no increase in right ventricular wall thickness. Right ventricular systolic pressure is mildly elevated with an estimated pressure of 35.9 mmHg.  4. Left atrial size was mild-moderately dilated.  5. There is mild mitral annular calcification present.  6. There is mild dilatation of the ascending aorta measuring 40 mm.   ASSESSMENT AND PLAN:  1.  CAD s/p CABG in October 2016 following NSTEMI.   Myoview negative in May 2018. Cardiac cath in May 2020 showed all  grafts were patent. Continue statin.Not on beta blocker due to bradycardia.  2. S/p bioprosthetic AVR for aortic stenosis. On routine SBE prophylaxis.  Echo June 2020 unchanged from initial post op Echo. 3.  Hyperlipidemia. Satisfactory on  Crestor 5 mg daily. Plans to have lab done when he sees Dr Jenny Reichmann in June 4. HTN. With white coat syndrome. BP at home acceptable. Continue same meds.  5. DOE. Suspect related to diastolic dysfunction and hypertensive heart disease. Improved.  6.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. 7. Depression. This has improved.     Coriann Brouhard [name] MD,FACC 06/24/2019 8:21 AM

## 2019-06-19 ENCOUNTER — Other Ambulatory Visit: Payer: Self-pay

## 2019-06-19 ENCOUNTER — Telehealth (INDEPENDENT_AMBULATORY_CARE_PROVIDER_SITE_OTHER): Payer: Medicare Other | Admitting: Neurology

## 2019-06-19 VITALS — BP 134/72

## 2019-06-19 DIAGNOSIS — F32A Depression, unspecified: Secondary | ICD-10-CM

## 2019-06-19 DIAGNOSIS — F329 Major depressive disorder, single episode, unspecified: Secondary | ICD-10-CM

## 2019-06-19 DIAGNOSIS — F331 Major depressive disorder, recurrent, moderate: Secondary | ICD-10-CM | POA: Diagnosis not present

## 2019-06-19 DIAGNOSIS — G250 Essential tremor: Secondary | ICD-10-CM

## 2019-06-24 ENCOUNTER — Encounter: Payer: Self-pay | Admitting: Cardiology

## 2019-06-24 ENCOUNTER — Other Ambulatory Visit: Payer: Self-pay

## 2019-06-24 ENCOUNTER — Ambulatory Visit: Payer: Medicare Other | Admitting: Cardiology

## 2019-06-24 VITALS — BP 168/74 | HR 59 | Ht 68.0 in | Wt 223.0 lb

## 2019-06-24 DIAGNOSIS — E785 Hyperlipidemia, unspecified: Secondary | ICD-10-CM | POA: Diagnosis not present

## 2019-06-24 DIAGNOSIS — Z952 Presence of prosthetic heart valve: Secondary | ICD-10-CM | POA: Diagnosis not present

## 2019-06-24 DIAGNOSIS — I10 Essential (primary) hypertension: Secondary | ICD-10-CM

## 2019-06-24 DIAGNOSIS — I5032 Chronic diastolic (congestive) heart failure: Secondary | ICD-10-CM

## 2019-06-24 DIAGNOSIS — I25810 Atherosclerosis of coronary artery bypass graft(s) without angina pectoris: Secondary | ICD-10-CM | POA: Diagnosis not present

## 2019-06-24 NOTE — Patient Instructions (Signed)
Medication Instructions:  CONTINUE WITH CURRENT MEDICATIONS *If you need a refill on your cardiac medications before your next appointment, please call your pharmacy*  Lab Work: NONE If you have labs (blood work) drawn today and your tests are completely normal, you will receive your results only by: [name] MyChart Message (if you have MyChart) OR . A paper copy in the mail If you have any lab test that is abnormal or we need to change your treatment, we will call you to review the results.  Follow-Up: At [facility], you and your health needs are our priority.  As part of our continuing mission to provide you with exceptional heart care, we have created designated Provider Care Teams.  These Care Teams include your primary Cardiologist (physician) and Advanced Practice Providers (APPs -  Physician Assistants and Nurse Practitioners) who all work together to provide you with the care you need, when you need it.  Your next appointment:   6 month(s)  The format for your next appointment:   In Person  Provider:   [name], MD

## 2019-07-11 ENCOUNTER — Other Ambulatory Visit: Payer: Self-pay | Admitting: Internal Medicine

## 2019-07-22 ENCOUNTER — Encounter: Payer: Self-pay | Admitting: Orthopaedic Surgery

## 2019-07-26 ENCOUNTER — Other Ambulatory Visit: Payer: Self-pay

## 2019-07-29 ENCOUNTER — Other Ambulatory Visit: Payer: Self-pay | Admitting: Neurology

## 2019-07-29 ENCOUNTER — Ambulatory Visit: Payer: Medicare Other | Admitting: Neurology

## 2019-07-29 NOTE — Telephone Encounter (Signed)
Rx(s) sent to pharmacy electronically.  

## 2019-07-29 NOTE — Telephone Encounter (Signed)
4 po bid is correct (at the bottom of my last note).  You can send that in.  #[phone].  RF1

## 2019-08-01 ENCOUNTER — Encounter (HOSPITAL_COMMUNITY): Payer: Self-pay

## 2019-08-01 ENCOUNTER — Other Ambulatory Visit: Payer: Self-pay | Admitting: Physician Assistant

## 2019-08-01 NOTE — Pre-Procedure Instructions (Addendum)
Troy Banks  08/01/2019    Your procedure is scheduled on Tuesday, March 9.*  Report to [facility], Main Entrance or Entrance "A" at 1000 A.M.                  Your surgery or procedure is scheduled for 12:00 noon   Call this number if you have problems the morning of surgery: [phone]  This is the number for the Pre- Surgical Desk.                    For any other questions, please call [phone], Monday - Friday 8 AM - 4 PM.    Remember:  Do not eat after midnight Monday, March 8.  You may drink clear liquids until  9:00 AM.              Clear liquids allowed are:   Water, Juice (non-citric and without pulp), Carbonated beverages, Clear Tea, Black Coffee only, Plain Jell-O only, Gatorade and Plain Popsicles only   Please Drink Pre -Surgery Ensure between 8:30 am and 9:00 AM - then nothing else by mouth.    Take these medicines the morning of surgery with A SIP OF WATER:              allopurinol (ZYLOPRIM)              amotidine (PEPCID)              levothyroxine (SYNTHROID)               primidone (MYSOLINE)              pantoprazole (PROTONIX)  May take if needed: tiZANidine (ZANAFLEX)      nitroGLYCERIN (NITROSTAT)      LUBRICANT EYE DROPS      Tums       1 Week prior to surgery STOP taking  Aspirin Products (Goody Powder, Excedrin Migraine), Ibuprofen (Advil), Naproxen (Aleve), Vitamins and Herbal Products (ie Fish Oil).   Follow Dr Trevor Mace instructions regarding Aspirin  Special instructions:  [place]- Preparing For Surgery  Before surgery, you can play an important role. Because skin is not sterile, your skin needs to be as free of germs as possible. You can reduce the number of germs on your skin by washing with CHG (chlorahexidine gluconate) Soap before surgery.  CHG is an antiseptic cleaner which kills germs and bonds with the skin to continue killing germs even after washing.    Oral Hygiene is also important to reduce your risk  of infection.  Remember - BRUSH YOUR TEETH THE MORNING OF SURGERY WITH YOUR REGULAR TOOTHPASTE  Please do not use if you have an allergy to CHG or antibacterial soaps. If your skin becomes reddened/irritated stop using the CHG.  Do not shave (including legs and underarms) for at least 48 hours prior to first CHG shower. It is OK to shave your face.  Please follow these instructions carefully.   1. Shower the NIGHT BEFORE SURGERY and the MORNING OF SURGERY with CHG.   2. If you chose to wash your hair, wash your hair first as usual with your normal shampoo.  3. After you shampoo,wash your face and private area with the soap you use at home, then rinse your hair and body thoroughly to remove the shampoo and soap..  4. Use CHG as you would any other liquid soap. You can apply CHG directly to the skin and  wash gently with a scrungie or a clean washcloth.   5. Apply the CHG Soap to your body ONLY FROM THE NECK DOWN.  Do not use on open wounds or open sores. Avoid contact with your eyes, ears, mouth and genitals (private parts).   6. Wash thoroughly, paying special attention to the area where your surgery will be performed.  7. Thoroughly rinse your body with warm water from the neck down.  8. DO NOT shower/wash with your normal soap after using and rinsing off the CHG Soap.  9. Pat yourself dry with a CLEAN TOWEL.  10. Wear CLEAN PAJAMAS to bed the night before surgery, wear comfortable clothes the morning of surgery  11. Place CLEAN SHEETS on your bed the night of your first shower and DO NOT SLEEP WITH PETS.   Day of Surgery: Shower as instructed above. Do not apply any deodorants/lotions, powders or colognes.  Please wear clean clothes to the hospital/surgery center.   Remember to brush your teeth WITH YOUR REGULAR TOOTHPASTE.  Do not wear jewelry, make-up or nail polish.             Men may shave face and neck.  Do not bring valuables to the hospital.  [facility] is not  responsible for any belongings or valuables.  Contacts, dentures or bridgework may not be worn into surgery.  Leave your suitcase in the car.  After surgery it may be brought to your room.  For patients admitted to the hospital, discharge time will be determined by your treatment team.  Patients discharged the day of surgery will not be allowed to drive home.   Please read over the following fact sheets that you were given: Pain Booklet, Safe Pain Management, Guide to Total Joint Replacement  Patient Instructions for Mupirocin Application, Incentive Spirometry and Surgical Site Infections.

## 2019-08-02 ENCOUNTER — Encounter (HOSPITAL_COMMUNITY): Payer: Self-pay

## 2019-08-02 ENCOUNTER — Other Ambulatory Visit: Payer: Self-pay

## 2019-08-02 ENCOUNTER — Encounter (HOSPITAL_COMMUNITY)
Admission: RE | Admit: 2019-08-02 | Discharge: 2019-08-02 | Disposition: A | Discharge status: Home / Self Care | Payer: Medicare Other | Source: Ambulatory Visit | Attending: Orthopaedic Surgery | Admitting: Orthopaedic Surgery

## 2019-08-02 DIAGNOSIS — I2510 Atherosclerotic heart disease of native coronary artery without angina pectoris: Secondary | ICD-10-CM | POA: Insufficient documentation

## 2019-08-02 DIAGNOSIS — Z87891 Personal history of nicotine dependence: Secondary | ICD-10-CM | POA: Diagnosis not present

## 2019-08-02 DIAGNOSIS — Z7901 Long term (current) use of anticoagulants: Secondary | ICD-10-CM | POA: Diagnosis not present

## 2019-08-02 DIAGNOSIS — M1612 Unilateral primary osteoarthritis, left hip: Secondary | ICD-10-CM | POA: Insufficient documentation

## 2019-08-02 DIAGNOSIS — K219 Gastro-esophageal reflux disease without esophagitis: Secondary | ICD-10-CM | POA: Insufficient documentation

## 2019-08-02 DIAGNOSIS — Z951 Presence of aortocoronary bypass graft: Secondary | ICD-10-CM | POA: Insufficient documentation

## 2019-08-02 DIAGNOSIS — G4733 Obstructive sleep apnea (adult) (pediatric): Secondary | ICD-10-CM | POA: Insufficient documentation

## 2019-08-02 DIAGNOSIS — M109 Gout, unspecified: Secondary | ICD-10-CM | POA: Insufficient documentation

## 2019-08-02 DIAGNOSIS — E039 Hypothyroidism, unspecified: Secondary | ICD-10-CM | POA: Diagnosis not present

## 2019-08-02 DIAGNOSIS — I110 Hypertensive heart disease with heart failure: Secondary | ICD-10-CM | POA: Insufficient documentation

## 2019-08-02 DIAGNOSIS — I509 Heart failure, unspecified: Secondary | ICD-10-CM | POA: Diagnosis not present

## 2019-08-02 DIAGNOSIS — E118 Type 2 diabetes mellitus with unspecified complications: Secondary | ICD-10-CM | POA: Diagnosis not present

## 2019-08-02 DIAGNOSIS — F4310 Post-traumatic stress disorder, unspecified: Secondary | ICD-10-CM | POA: Insufficient documentation

## 2019-08-02 DIAGNOSIS — Z01818 Encounter for other preprocedural examination: Secondary | ICD-10-CM | POA: Diagnosis not present

## 2019-08-02 DIAGNOSIS — Z79899 Other long term (current) drug therapy: Secondary | ICD-10-CM | POA: Diagnosis not present

## 2019-08-02 DIAGNOSIS — I252 Old myocardial infarction: Secondary | ICD-10-CM | POA: Insufficient documentation

## 2019-08-02 DIAGNOSIS — Z01812 Encounter for preprocedural laboratory examination: Secondary | ICD-10-CM | POA: Insufficient documentation

## 2019-08-02 HISTORY — DX: Personal history of other medical treatment: Z92.89

## 2019-08-02 HISTORY — DX: Atherosclerotic heart disease of native coronary artery without angina pectoris: I25.10

## 2019-08-02 HISTORY — DX: Family history of other specified conditions: Z84.89

## 2019-08-02 HISTORY — DX: Malignant (primary) neoplasm, unspecified: C80.1

## 2019-08-02 HISTORY — DX: Pneumonia, unspecified organism: J18.9

## 2019-08-02 LAB — BASIC METABOLIC PANEL
Anion gap: 13 (ref 5–15)
BUN: 14 mg/dL (ref 8–23)
CO2: 23 mmol/L (ref 22–32)
Calcium: 9.3 mg/dL (ref 8.9–10.3)
Chloride: 103 mmol/L (ref 98–111)
Creatinine, Ser: 0.89 mg/dL (ref 0.61–1.24)
GFR calc Af Amer: >60 mL/min (ref >60)
GFR calc non Af Amer: >60 mL/min (ref >60)
Glucose, Bld: 119 mg/dL — ABNORMAL HIGH (ref 70–99)
Potassium: 3.7 mmol/L (ref 3.5–5.1)
Sodium: 139 mmol/L (ref 135–145)

## 2019-08-02 LAB — SURGICAL PCR SCREEN
MRSA, PCR: NEGATIVE
Staphylococcus aureus: POSITIVE — AB

## 2019-08-02 NOTE — Progress Notes (Addendum)
PCP - Dr Cathlean Cower  Cardiologist - Dr [name]  Chest x-ray -   EKG - 10/25/2018  Stress Test - 2018  ECHO - 2020  Cardiac Cath - 2018  Sleep Study - YES- Patient said it was done many years ago  CPAP - Yes- wears it every night and ifor naps. LABS- CBC, BMP, PCR  ASA- per Dr Ninfa Linden, instructed patient's wife [name] to call MDs office.  ERAS- Yes,clear liquids until 0900, drink Pre- Surgery Ensure;no food after midnight the night before.  HA1C-na Fasting Blood Sugar - na Checks Blood Sugar ___0__ times a day  Anesthesia-  Pt denies having chest pain, sob, or fever at this time. All instructions explained to the pt, with a verbal understanding of the material. Pt agrees to go over the instructions while at home for a better understanding. Pt also instructed to self quarantine after being tested for COVID-19. The opportunity to ask questions was provided.

## 2019-08-04 ENCOUNTER — Encounter: Payer: Self-pay | Admitting: Orthopaedic Surgery

## 2019-08-05 NOTE — Progress Notes (Addendum)
Anesthesia Chart Review:  Case: [id number] Date/Time: 08/13/19 1145   Procedure: LEFT TOTAL HIP ARTHROPLASTY ANTERIOR APPROACH (Left Hip)   Anesthesia type: Choice   Pre-op diagnosis: osteoarthritis left hip   Location: [facility] OR ROOM 06 / [place] [facility]   Surgeons: Troy Rossetti, MD      DISCUSSION: Patient is a 79 year old male scheduled for the above procedure.   History includes former smoker (quit '92), CAD/NSTEMI 03/2015 and moderate AS s/p CABG (LIMA-LAD, SVG-INTERMEDIATE, SVG-CX, SVG-dRCA) and AVR (23 mm [employer] pericardial tissue valve) 03/30/15, CHF, dysrhythmia (post-CABG afib, resolved), HTN, hypercholesterolemia, DM (diet controlled), carotid stenosis (mild; F/U PRN '17), hypothyroidism, OSA (CPAP), GERD, familial tremor, anxiety, PTSD, skin cancer, right THA 10/27/15, ureteral stent 11/07/16 (stent exchange 11/23/16), neck surgery (ACDF '02; posterior cervical fusion '06), tonsillectomy, left inguinal hernia repair '62. He fell off of a roof (while cleaning gutters) on 02/22/17 and sustained a left ankle fracture s/p external fixation 02/22/17 and acute L2 compression fracture (s/p brace; no surgical intervention per Neurosurgeon Dr. Kary Banks). S/p removal of external fixator, primary fusion of left pilon fracture with RIA bong grafting from left femur 03/17/17.  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 According to anesthesiologist Dr. Shanon Brow Banks's 03/28/15 note, "From a cursory review of the literature, it appearsthis deficiency can occur in up to 6-10% of the caucasian population and results in reduced activity of the CYP2D^ enzyme. This deficiency can result in the reduced metabolism of codeine , tramadol, SSRI inhibitors, beta blockers, ondanestron, and lidocaine among other meds. I have not found any contraindications in the literature to using  benzodiazepines, fentanyl, volatile agents, propofol, muscle relaxants or other commonly used anesthetic agents in patients with this enzyme deficiency. Diazepam (Valium) is very lipid soluble and can accumulate in the body if given for an extended period of time."; please see Dr. Verneda Banks note 03/28/15.  - Patient reports he requires increased anesthesia ("takes a lot to put me under") and also pt has PTSD, reports "will wake up fighting" if emerges too rapidly.   He was doing well at last cardiology evaluation with Dr. [name] on 06/24/19. Dr. [name] is aware of surgery plans. On routine SBE prophylaxis for bioprosthetic AVR. 11/2018 echo felt stable from post-op echo. Continue medical therapy for CAD. No b-blocker due to bradycardia. Per Kerin Ransom, [name] on 06/04/20, "...based on ACC/AHA guidelines, Troy Banks would be at acceptable risk for the planned procedure without further cardiovascular testing.  OK to hold aspirin 3-5 days pre op if needed."   Notes indicate that his wife is a retired Quarry manager and daughter is a family Loss adjuster, chartered in [place].   Apparently, CBC order not released at his PAT visit, so CBC not done.  Will attempt to contact him and see if he can come in between now and surgery for repeat lab. Also need to clarify anesthesiologist request of "Dr. Ola Banks". Presurgical COVID-19 test is scheduled for 08/10/19.    ADDENDUM 08/06/19 2:40 PM: I called and spoke with patient and his wife Troy Banks--I was on speaker phone, but she answered most of the questions by his request. (FYI: He had consented to leave verbal information with her or his daughter Troy Banks per designated party release form on 05/31/17.). They believe that he has met both Dr. Herbie Banks and Dr. Oren Banks, so would be happy with either being his anesthesiologist.  Currently, Dr. Oren Banks is scheduled to work at [facility] on 08/13/19, so I told them I would put in the request, although I could not definitively  guarantee that he would be his anesthesiologist because it could depend on scheduling needs that day. They also had questions about the anesthesia plan--interested in spinal anesthesia (had done well in the past with spinal, although developed urinary retention after cystoscopy/ureteral stent exchange on 620/18). He is s/p CABG/AVR in 2016 but AV mean gradient 19.2 by 2020 echo. He denied back surgery and blood thinners. He is holding ASA for 6 days prior to surgery at the surgeon's request. He can lie flat without difficultly. I believe he would be a candidate for spinal anesthesia, but again discussed definitive plan would be determiend by assigned anesthesiologist following evaluation. We did discuss his elevated BP from his PAT visit (184/60). He has anxiety about surgery and tends to have "white coat" hypertension with home readings ~ 130-140's/60's. His wife will bring him for surgery and plans to be in the waiting area to be available if needed. He will come in for preoperative CBC on 08/07/19. (UPDATE 08/09/19 3:18 PM: Troy Banks had preoperative CBC on 08/09/19 which was normal.)    VS: BP (!) 184/60   Pulse 61   Temp 36.8 C   Resp 18   Ht _0  (1.727 m)   Wt 99.8 kg   SpO2 99%   BMI 33.45 kg/m   PROVIDERS: Troy Borg, MD is PCP  - Banks, Peter, MD is cardiologist. Last evaluation 06/24/19.  Troy Bogus, DO is neurologist. Telemedicine visit 06/19/19 for essential tremor follow-up. Her note outlines barriers in trying alternative therapies for essential tremor. He wants DBS considered, but she wants depression/mood evaluated. He was referred to Cottle, [place], MD with neurology & psychiatry.     LABS: Labs reviewed: Acceptable for surgery. See DISCUSSION. UPDATE: Normal CBC on 08/09/19. (all labs ordered are listed, but only abnormal results are displayed)  Labs Reviewed  SURGICAL PCR SCREEN - Abnormal; Notable for the following components:      Result Value   Staphylococcus  aureus POSITIVE (*)    All other components within normal limits  BASIC METABOLIC PANEL - Abnormal; Notable for the following components:   Glucose, Bld 119 (*)    All other components within normal limits     EKG: 10/25/18: Ventricular rate 57 bpm Sinus bradycardia with 1st degree A-V block Otherwise normal ECG No significant change since last tracing Confirmed by Carlyle Dolly [phone]) on 10/26/2018 3:17:33 PM   CV: Echo 11/20/18: IMPRESSIONS  1. A 44m an Edwards porcine bioprosthesis valve is present in the aortic  position. Procedure Date: 03/30/15 Echo findings shows no evidence of  regurgitation, perivalvular leak, rocking or dehiscence of the aortic  prosthesis. Bioprosthetic leaflets not  well seen. Mean systolic gradient is 19 mmHg, no significant difference  from gradient reported 1 month post operative in 04/2015.  AV Area (Vmax):  1.31 cm    AV Area (Vmean): 1.27 cm    AV Area (VTI):  1.25 cm    AV Vmax:     297.75 cm/s   AV Vmean:     203.250 cm/s  AV VTI:      0.719 m     AV Peak Grad:   35.5 mmHg    AV Mean Grad:   19.2 mmHg    2. The left ventricle has normal systolic function with an ejection  fraction of 60-65%. The cavity size was normal. There is mildly increased  left ventricular wall thickness. Left ventricular diastolic Doppler  parameters are consistent with impaired  relaxation due to nondiagnostic images. Elevated left ventricular  end-diastolic pressure The E/e' is 20.  3. The right ventricle has normal systolic function. The cavity was  normal. There is no increase in right ventricular wall thickness. Right  ventricular systolic pressure is mildly elevated with an estimated  pressure of 35.9 mmHg.  4. Left atrial size was mild-moderately dilated.  5. There is mild mitral annular calcification present.  6. There is mild dilatation of the ascending aorta measuring 40 mm.     Cardiac cath 10/25/18:  Ost LAD to Prox LAD lesion is 80% stenosed. Mid LAD lesion is 100% stenosed -after second septal branch which coursed is a tandem LAD  LIMA-dLAD graft was visualized by angiography and is normal in caliber. The graft exhibits no disease.  Ost Ramus-1 lesion is 90% stenosed. Ost Ramus-2 lesion is 60% stenosed (shortly after lateral branch).  SVG-Ramus graft was visualized by angiography and is large. The graft exhibits minimal luminal irregularities.  Ost Cx lesion is 80% stenosed. Prox Cx to Mid Cx lesion is 80% stenosed. Mid Cx lesion is 60% stenosed.  SVG-dCx graft was visualized by angiography and is large. The graft exhibits minimal luminal irregularities. Retrograde filling of OM 2 and AV groove circumflex  Prox RCA to Mid RCA lesion is 100% stenosed. This is progression from 99% stenosis pre-CABG.  SVG-dRCA graft was visualized by angiography and is large. The graft exhibits no disease.  Unable to cross aortic valve prosthesis. SUMMARY  Severe native CAD essentially unchanged from post CABG with exception of now 100% occluded RCA.  4 out of 4 patent grafts: LIMA-LAD, SVG-RI (D1), SVG-dCx, SVG-D RCA  Systemic Hypertension  Angiographically small aortic root/stent prosthetic valve.  Unable to cross.  RECOMMENDATIONS  Discharge home after bedrest and hydration  Consider 2D echocardiogram to assess for restenosis of aortic valve  Pending echocardiographic evaluation, would consider non-anginal etiology -> with hypertension, could be related to diastolic dysfunction.   Nuclear stress test 11/01/16 (ordered for evaluation of DOE):  The left ventricular ejection fraction is mildly decreased (45-54%).  Nuclear stress EF: 53%.  No T wave inversion was noted during stress.  There was no ST segment deviation noted during stress.  This is a low risk study. No reversible ischemia. LVEF 53% with normal wall motion. This is a low risk  study.   Carotid duplex 04/19/16:  Impression:  Heterogeneous plaque, bilaterally. 1-39% bilateral ICA stenosis. > 50% left ECA stenosis. Normal subclavian arteries, bilaterally. Patent vertebral arteries with antegrade flow. F/U PRN.    Past Medical History:  Diagnosis Date  . Allergy   . Anxiety    PTSD  . Aortic stenosis, moderate 03/27/2015   post AVR echo Echo 11/16: Mild LVH, EF 55-60%, normal wall motion, grade 1 diastolic dysfunction, bioprosthetic AVR okay (mean gradient 18 mmHg) with trivial AI, mild LAE, atrial septal aneurysm, small effusion   . Arthritis    "qwhere" (10/29/2015)  . Cancer ([place])    skin cancer  . Carotid artery stenosis    Carotid US 11/17: 1-39% bilateral ICA stenosis. F/u prn  . Cervical stenosis of spinal canal    fusion 2006  . CHF (congestive heart failure) ([place])    "secondry to OHS"  . Closed left pilon fracture   . COLONIC POLYPS, HX OF   . Complication  of anesthesia    "takes a lot to put him under and has woken up during surgery" Difficult to wake up . PTSD; "does not metabolize RX well"that he gets "violent", per pt.   . Coronary artery disease   . DIVERTICULOSIS, COLON   . Dysrhythmia    2 bouts of afib post op and at home but corrected with medication.  . Familial tremor 03/19/2014  . Family history of adverse reaction to anesthesia    Father - hold to get asleep  . GERD (gastroesophageal reflux disease)    uses Zantac  . GOUT   . Headache   . Heart murmur    had aortic value replacement  . High cholesterol   . History of blood transfusion   . History of kidney stones    surgery  . HYPERTENSION    off ACEI 2010 because of hyperkalemia in setting of ARI  . Hypothyroidism    newly diaganosed 09/2015  . Myocardial infarction [facility])    October 20th 2016  . OSA on CPAP   . Pneumonia   . 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 ([place])    confirmed heterozygous 10/24/14 labs  . PTSD (post-traumatic stress  disorder)   . PTSD (post-traumatic stress disorder)   . Sleep apnea    On a CPAP    Past Surgical History:  Procedure Laterality Date  . ANKLE FUSION Left 03/17/2017   Procedure: FUSION  PILON FRACTURE WITH BONE GRAFTING;  Surgeon: Shona Needles, MD;  Location: [place];  Service: Orthopedics;  Laterality: Left;  . ANTERIOR FUSION CERVICAL SPINE  2002  . AORTIC VALVE REPLACEMENT N/A 03/30/2015   Procedure: AORTIC VALVE REPLACEMENT (AVR);  Surgeon: Grace Isaac, MD;  Location: [place];  Service: Open Heart Surgery;  Laterality: N/A;  . CARDIAC CATHETERIZATION N/A 03/26/2015   Procedure: Left Heart Cath and Coronary Angiography;  Surgeon: Leonie Man, MD;  Location: [facility];  Service: Cardiovascular;  Laterality: N/A;  . CARDIAC VALVE REPLACEMENT    . COLONOSCOPY W/ POLYPECTOMY    . CORONARY ARTERY BYPASS GRAFT N/A 03/30/2015   Procedure: CORONARY ARTERY BYPASS GRAFTING (CABG) x four, using left internal mammary artery and left    leg greater saphenous vein harvested endoscopically ;  Surgeon: Grace Isaac, MD;  Location: [place];  Service: Open Heart Surgery;  Laterality: N/A;  . CYSTOSCOPY/URETEROSCOPY/HOLMIUM LASER/STENT PLACEMENT Bilateral 11/07/2016   Procedure: CYSTOSCOPY/URETEROSCOPY/HOLMIUM LASER/STENT PLACEMENT;  Surgeon: Nickie Retort, MD;  Location: [facility];  Service: Urology;  Laterality: Bilateral;  . CYSTOSCOPY/URETEROSCOPY/HOLMIUM LASER/STENT PLACEMENT Bilateral 11/23/2016   Procedure: CYSTOSCOPY/BILATERAL URETEROSCOPY/HOLMIUM LASER/STENT EXCHANGE;  Surgeon: Nickie Retort, MD;  Location: [facility];  Service: Urology;  Laterality: Bilateral;  NEEDS 90 MIN   . EXTERNAL FIXATION LEG Left 02/22/2017   Procedure: EXTERNAL FIXATION LEFT ANKLE;  Surgeon: Renette Butters, MD;  Location: [place];  Service: Orthopedics;  Laterality: Left;  . HEMORRHOID BANDING  1970s  . INGUINAL HERNIA REPAIR Left 1962  . [place]   "cut me open"  . LEFT  HEART CATH AND CORS/GRAFTS ANGIOGRAPHY N/A 10/25/2018   Procedure: LEFT HEART CATH AND CORS/GRAFTS ANGIOGRAPHY;  Surgeon: Leonie Man, MD;  Location: [place] [facility];  Service: Cardiovascular;  Laterality: N/A;  . POSTERIOR FUSION CERVICAL SPINE  2006  . TEE WITHOUT CARDIOVERSION N/A 03/30/2015   Procedure: TRANSESOPHAGEAL ECHOCARDIOGRAM (TEE);  Surgeon: Grace Isaac, MD;  Location: [place];  Service: Open Heart Surgery;  Laterality: N/A;  . TESTICLE TORSION REDUCTION Right 1960  . TOE FUSION Left 1985 & 2004   "great toe" and removal  . TONSILLECTOMY    . TOTAL HIP ARTHROPLASTY Right 10/27/2015   Procedure: RIGHT TOTAL HIP ARTHROPLASTY ANTERIOR APPROACH and steroid injection right foot;  Surgeon: Troy Rossetti, MD;  Location: [place];  Service: Orthopedics;  Laterality: Right;    MEDICATIONS: . allopurinol (ZYLOPRIM) 300 MG tablet  . amLODipine (NORVASC) 10 MG tablet  . Ascorbic Acid (VITAMIN C) 1000 MG tablet  . aspirin 81 MG chewable tablet  . calcium carbonate (TUMS EX) 750 MG chewable tablet  . Carboxymethylcellulose Sodium (LUBRICANT EYE DROPS OP)  . Cholecalciferol (VITAMIN D-3) 125 MCG (5000 UT) TABS  . famotidine (PEPCID) 40 MG tablet  . furosemide (LASIX) 40 MG tablet  . ibuprofen (ADVIL) 200 MG tablet  . levothyroxine (SYNTHROID) 25 MCG tablet  . nitroGLYCERIN (NITROSTAT) 0.4 MG SL tablet  . pantoprazole (PROTONIX) 40 MG tablet  . primidone (MYSOLINE) 50 MG tablet  . rosuvastatin (CRESTOR) 10 MG tablet  . tiZANidine (ZANAFLEX) 4 MG tablet  . Turmeric 500 MG CAPS  . vitamin B-12 (CYANOCOBALAMIN) 1000 MCG tablet   . sodium chloride flush (NS) 0.9 % injection 3 mL    Myra Gianotti, [name] Surgical Short Stay/Anesthesiology [facility] Phone [phone] [facility] Phone [phone] 08/05/2019 4:33 PM

## 2019-08-06 ENCOUNTER — Encounter (HOSPITAL_COMMUNITY): Payer: Self-pay

## 2019-08-06 NOTE — Anesthesia Preprocedure Evaluation (Addendum)
Anesthesia Evaluation  Patient identified by MRN, date of birth, ID band Patient awake    Reviewed: Allergy & Precautions, H&P , NPO status , Patient's Chart, lab work & pertinent test results  History of Anesthesia Complications (+) Family history of anesthesia reaction  Airway Mallampati: III  TM Distance: >3 FB Neck ROM: Limited    Dental no notable dental hx. (+) Teeth Intact, Dental Advisory Given   Pulmonary sleep apnea and Continuous Positive Airway Pressure Ventilation , former smoker,    Pulmonary exam normal breath sounds clear to auscultation       Cardiovascular hypertension, Pt. on medications  Rhythm:Regular Rate:Normal     Neuro/Psych  Headaches, Anxiety Depression    GI/Hepatic Neg liver ROS, GERD  Medicated and Controlled,  Endo/Other  negative endocrine ROSHypothyroidism   Renal/GU negative Renal ROS  negative genitourinary   Musculoskeletal  (+) Arthritis ,   Abdominal   Peds  Hematology negative hematology ROS (+)   Anesthesia Other Findings   Reproductive/Obstetrics negative OB ROS                             Anesthesia Physical Anesthesia Plan  ASA: III  Anesthesia Plan: Spinal and MAC   Post-op Pain Management:    Induction: Intravenous  PONV Risk Score and Plan: 2 and Propofol infusion and Treatment may vary due to age or medical condition  Airway Management Planned: Simple Face Mask  Additional Equipment:   Intra-op Plan:   Post-operative Plan:   Informed Consent: I have reviewed the patients History and Physical, chart, labs and discussed the procedure including the risks, benefits and alternatives for the proposed anesthesia with the patient or authorized representative who has indicated his/her understanding and acceptance.     Dental advisory given  Plan Discussed with: CRNA  Anesthesia Plan Comments: (Patient is heterozygous for the  cytochrome p450 CYP2D6 null variant; this deficiency can result in reduced metabolism of certain medications. See my PAT note by Myra Gianotti, [name]).   Requests Dr. Ola Spurr. Case is posted as Choice anesthesia. Reportedly he has done well with spinal anesthesia in the past, although with post-op urinary retention after 11/23/16 cystoscopy/ureteral stent exchange. History of AVR/CABG.)       Anesthesia Quick Evaluation

## 2019-08-07 ENCOUNTER — Inpatient Hospital Stay (HOSPITAL_COMMUNITY): Admission: RE | Admit: 2019-08-07 | Payer: Medicare Other | Source: Ambulatory Visit

## 2019-08-09 ENCOUNTER — Encounter (HOSPITAL_COMMUNITY)
Admission: RE | Admit: 2019-08-09 | Discharge: 2019-08-09 | Disposition: A | Discharge status: Home / Self Care | Payer: Medicare Other | Source: Ambulatory Visit | Attending: Orthopaedic Surgery | Admitting: Orthopaedic Surgery

## 2019-08-09 ENCOUNTER — Other Ambulatory Visit: Payer: Self-pay

## 2019-08-09 DIAGNOSIS — Z01812 Encounter for preprocedural laboratory examination: Secondary | ICD-10-CM | POA: Diagnosis not present

## 2019-08-09 LAB — CBC
HCT: 45.9 % (ref 39.0–52.0)
Hemoglobin: 15.4 g/dL (ref 13.0–17.0)
MCH: 30.9 pg (ref 26.0–34.0)
MCHC: 33.6 g/dL (ref 30.0–36.0)
MCV: 92.2 fL (ref 80.0–100.0)
Platelets: 176 10*3/uL (ref 150–400)
RBC: 4.98 MIL/uL (ref 4.22–5.81)
RDW: 13.6 % (ref 11.5–15.5)
WBC: 5.5 10*3/uL (ref 4.0–10.5)
nRBC: 0 % (ref 0.0–0.2)

## 2019-08-10 ENCOUNTER — Other Ambulatory Visit (HOSPITAL_COMMUNITY)
Admission: RE | Admit: 2019-08-10 | Discharge: 2019-08-10 | Disposition: A | Discharge status: Home / Self Care | Payer: Medicare Other | Source: Ambulatory Visit | Attending: Orthopaedic Surgery | Admitting: Orthopaedic Surgery

## 2019-08-10 DIAGNOSIS — Z01812 Encounter for preprocedural laboratory examination: Secondary | ICD-10-CM | POA: Diagnosis not present

## 2019-08-10 DIAGNOSIS — Z20822 Contact with and (suspected) exposure to covid-19: Secondary | ICD-10-CM | POA: Insufficient documentation

## 2019-08-10 LAB — SARS CORONAVIRUS 2 (TAT 6-24 HRS): SARS Coronavirus 2: NEGATIVE

## 2019-08-12 ENCOUNTER — Telehealth: Payer: Self-pay | Admitting: *Deleted

## 2019-08-12 NOTE — Telephone Encounter (Signed)
Pre-op Ortho bundle call completed.

## 2019-08-12 NOTE — Care Plan (Signed)
RNCM call to patient to discuss his upcoming Left THA with Dr. Ninfa Linden on 08/13/19. Patient is an Ortho bundle patient with THN/TOM. Reviewed this with his wife, who RNCM spoke with for pre-op call. Reviewed all pre- and post-op instructions. Patient has family/wife that will assist after surgery. He has all DME needed. [facility] after short hospital stay. Referral made and [place] liaison cannot accommodate referral at this time due to insurance. [facility]. Wife/patient made aware. Will continue to follow for all CM needs.

## 2019-08-13 ENCOUNTER — Ambulatory Visit (HOSPITAL_COMMUNITY): Payer: Medicare Other | Admitting: Vascular Surgery

## 2019-08-13 ENCOUNTER — Encounter (HOSPITAL_COMMUNITY)
Admission: AD | Disposition: A | Discharge status: Home Health | Payer: Self-pay | Source: Home / Self Care | Attending: Orthopaedic Surgery

## 2019-08-13 ENCOUNTER — Other Ambulatory Visit: Payer: Self-pay

## 2019-08-13 ENCOUNTER — Encounter (HOSPITAL_COMMUNITY): Payer: Self-pay | Admitting: Orthopaedic Surgery

## 2019-08-13 ENCOUNTER — Observation Stay (HOSPITAL_COMMUNITY): Payer: Medicare Other

## 2019-08-13 ENCOUNTER — Inpatient Hospital Stay (HOSPITAL_COMMUNITY)
Admission: AD | Admit: 2019-08-13 | Discharge: 2019-08-15 | DRG: 470 | Disposition: A | Discharge status: Home Health | Payer: Medicare Other | Attending: Orthopaedic Surgery | Admitting: Orthopaedic Surgery

## 2019-08-13 ENCOUNTER — Ambulatory Visit (HOSPITAL_COMMUNITY): Payer: Medicare Other

## 2019-08-13 DIAGNOSIS — F419 Anxiety disorder, unspecified: Secondary | ICD-10-CM | POA: Diagnosis present

## 2019-08-13 DIAGNOSIS — Z87442 Personal history of urinary calculi: Secondary | ICD-10-CM

## 2019-08-13 DIAGNOSIS — E7800 Pure hypercholesterolemia, unspecified: Secondary | ICD-10-CM | POA: Diagnosis not present

## 2019-08-13 DIAGNOSIS — G4733 Obstructive sleep apnea (adult) (pediatric): Secondary | ICD-10-CM | POA: Diagnosis present

## 2019-08-13 DIAGNOSIS — Z888 Allergy status to other drugs, medicaments and biological substances status: Secondary | ICD-10-CM

## 2019-08-13 DIAGNOSIS — Z85828 Personal history of other malignant neoplasm of skin: Secondary | ICD-10-CM | POA: Diagnosis not present

## 2019-08-13 DIAGNOSIS — Z96642 Presence of left artificial hip joint: Secondary | ICD-10-CM | POA: Diagnosis not present

## 2019-08-13 DIAGNOSIS — Z813 Family history of other psychoactive substance abuse and dependence: Secondary | ICD-10-CM

## 2019-08-13 DIAGNOSIS — I2510 Atherosclerotic heart disease of native coronary artery without angina pectoris: Secondary | ICD-10-CM | POA: Diagnosis present

## 2019-08-13 DIAGNOSIS — Z8042 Family history of malignant neoplasm of prostate: Secondary | ICD-10-CM

## 2019-08-13 DIAGNOSIS — Z8249 Family history of ischemic heart disease and other diseases of the circulatory system: Secondary | ICD-10-CM | POA: Diagnosis not present

## 2019-08-13 DIAGNOSIS — M1612 Unilateral primary osteoarthritis, left hip: Secondary | ICD-10-CM | POA: Diagnosis not present

## 2019-08-13 DIAGNOSIS — Z471 Aftercare following joint replacement surgery: Secondary | ICD-10-CM | POA: Diagnosis not present

## 2019-08-13 DIAGNOSIS — M109 Gout, unspecified: Secondary | ICD-10-CM | POA: Diagnosis not present

## 2019-08-13 DIAGNOSIS — Z87891 Personal history of nicotine dependence: Secondary | ICD-10-CM | POA: Diagnosis not present

## 2019-08-13 DIAGNOSIS — I25119 Atherosclerotic heart disease of native coronary artery with unspecified angina pectoris: Secondary | ICD-10-CM | POA: Diagnosis not present

## 2019-08-13 DIAGNOSIS — Z96641 Presence of right artificial hip joint: Secondary | ICD-10-CM | POA: Diagnosis present

## 2019-08-13 DIAGNOSIS — E119 Type 2 diabetes mellitus without complications: Secondary | ICD-10-CM | POA: Diagnosis present

## 2019-08-13 DIAGNOSIS — I110 Hypertensive heart disease with heart failure: Secondary | ICD-10-CM | POA: Diagnosis present

## 2019-08-13 DIAGNOSIS — E039 Hypothyroidism, unspecified: Secondary | ICD-10-CM | POA: Diagnosis present

## 2019-08-13 DIAGNOSIS — Z419 Encounter for procedure for purposes other than remedying health state, unspecified: Secondary | ICD-10-CM

## 2019-08-13 DIAGNOSIS — Z833 Family history of diabetes mellitus: Secondary | ICD-10-CM

## 2019-08-13 DIAGNOSIS — Z86010 Personal history of colonic polyps: Secondary | ICD-10-CM

## 2019-08-13 DIAGNOSIS — F329 Major depressive disorder, single episode, unspecified: Secondary | ICD-10-CM | POA: Diagnosis present

## 2019-08-13 DIAGNOSIS — K219 Gastro-esophageal reflux disease without esophagitis: Secondary | ICD-10-CM | POA: Diagnosis not present

## 2019-08-13 DIAGNOSIS — I5032 Chronic diastolic (congestive) heart failure: Secondary | ICD-10-CM | POA: Diagnosis not present

## 2019-08-13 DIAGNOSIS — Z951 Presence of aortocoronary bypass graft: Secondary | ICD-10-CM

## 2019-08-13 DIAGNOSIS — I252 Old myocardial infarction: Secondary | ICD-10-CM

## 2019-08-13 DIAGNOSIS — Z981 Arthrodesis status: Secondary | ICD-10-CM | POA: Diagnosis not present

## 2019-08-13 DIAGNOSIS — I219 Acute myocardial infarction, unspecified: Secondary | ICD-10-CM | POA: Diagnosis present

## 2019-08-13 DIAGNOSIS — I6523 Occlusion and stenosis of bilateral carotid arteries: Secondary | ICD-10-CM | POA: Diagnosis not present

## 2019-08-13 DIAGNOSIS — E785 Hyperlipidemia, unspecified: Secondary | ICD-10-CM | POA: Diagnosis present

## 2019-08-13 HISTORY — PX: TOTAL HIP ARTHROPLASTY: SHX124

## 2019-08-13 SURGERY — ARTHROPLASTY, HIP, TOTAL, ANTERIOR APPROACH
Anesthesia: Monitor Anesthesia Care | Site: Hip | Laterality: Left

## 2019-08-13 MED ORDER — FENTANYL CITRATE (PF) 100 MCG/2ML IJ SOLN: 25.0000 ug | INTRAMUSCULAR | Status: DC | PRN

## 2019-08-13 MED ORDER — SODIUM CHLORIDE 0.9 % IV SOLN: INTRAVENOUS | Status: DC

## 2019-08-13 MED ORDER — VITAMIN B-12 1000 MCG PO TABS
1000.0000 ug | ORAL_TABLET | Freq: Two times a day (BID) | ORAL | Status: DC
  Administered 2019-08-13 – 2019-08-15 (×4): 1000 ug via ORAL
  Filled 2019-08-13 (×4): qty 1

## 2019-08-13 MED ORDER — OXYCODONE HCL 5 MG PO TABS: 5.0000 mg | ORAL_TABLET | ORAL | Status: DC | PRN

## 2019-08-13 MED ORDER — ROSUVASTATIN CALCIUM 5 MG PO TABS
5.0000 mg | ORAL_TABLET | Freq: Every day | ORAL | Status: DC
  Administered 2019-08-13 – 2019-08-15 (×3): 5 mg via ORAL
  Filled 2019-08-13 (×3): qty 1

## 2019-08-13 MED ORDER — LACTATED RINGERS IV SOLN: INTRAVENOUS | Status: DC

## 2019-08-13 MED ORDER — METOCLOPRAMIDE HCL 5 MG/ML IJ SOLN: 5.0000 mg | Freq: Three times a day (TID) | INTRAMUSCULAR | Status: DC | PRN

## 2019-08-13 MED ORDER — TIZANIDINE HCL 4 MG PO TABS
4.0000 mg | ORAL_TABLET | Freq: Three times a day (TID) | ORAL | Status: DC | PRN
  Administered 2019-08-13 – 2019-08-15 (×4): 4 mg via ORAL
  Filled 2019-08-13 (×4): qty 1

## 2019-08-13 MED ORDER — 0.9 % SODIUM CHLORIDE (POUR BTL) OPTIME
TOPICAL | Status: DC | PRN
  Administered 2019-08-13: 1000 mL

## 2019-08-13 MED ORDER — ASPIRIN 81 MG PO CHEW
81.0000 mg | CHEWABLE_TABLET | Freq: Two times a day (BID) | ORAL | Status: DC
  Administered 2019-08-13 – 2019-08-15 (×4): 81 mg via ORAL
  Filled 2019-08-13 (×4): qty 1

## 2019-08-13 MED ORDER — NITROGLYCERIN 0.4 MG SL SUBL: 0.4000 mg | SUBLINGUAL_TABLET | SUBLINGUAL | Status: DC | PRN

## 2019-08-13 MED ORDER — VITAMIN D 25 MCG (1000 UNIT) PO TABS
5000.0000 [IU] | ORAL_TABLET | Freq: Two times a day (BID) | ORAL | Status: DC
  Administered 2019-08-13 – 2019-08-15 (×4): 5000 [IU] via ORAL
  Filled 2019-08-13 (×4): qty 5

## 2019-08-13 MED ORDER — PRIMIDONE 50 MG PO TABS
200.0000 mg | ORAL_TABLET | Freq: Two times a day (BID) | ORAL | Status: DC
  Administered 2019-08-13 – 2019-08-15 (×4): 200 mg via ORAL
  Filled 2019-08-13 (×5): qty 4

## 2019-08-13 MED ORDER — HYDROMORPHONE HCL 1 MG/ML IJ SOLN
0.5000 mg | INTRAMUSCULAR | Status: DC | PRN
  Administered 2019-08-13 – 2019-08-14 (×5): 1 mg via INTRAVENOUS
  Filled 2019-08-13 (×6): qty 1

## 2019-08-13 MED ORDER — POVIDONE-IODINE 10 % EX SWAB: 2.0000 "application " | Freq: Once | CUTANEOUS | Status: DC

## 2019-08-13 MED ORDER — STERILE WATER FOR IRRIGATION IR SOLN
Status: DC | PRN
  Administered 2019-08-13: 1000 mL

## 2019-08-13 MED ORDER — PROPOFOL 500 MG/50ML IV EMUL
INTRAVENOUS | Status: DC | PRN
  Administered 2019-08-13: 50 ug/kg/min via INTRAVENOUS

## 2019-08-13 MED ORDER — OXYCODONE HCL 5 MG PO TABS
10.0000 mg | ORAL_TABLET | ORAL | Status: DC | PRN
  Filled 2019-08-13: qty 2

## 2019-08-13 MED ORDER — CEFAZOLIN SODIUM-DEXTROSE 2-4 GM/100ML-% IV SOLN
2.0000 g | INTRAVENOUS | Status: AC
  Administered 2019-08-13: 2 g via INTRAVENOUS

## 2019-08-13 MED ORDER — FUROSEMIDE 40 MG PO TABS
40.0000 mg | ORAL_TABLET | ORAL | Status: DC
  Administered 2019-08-13 – 2019-08-15 (×2): 40 mg via ORAL
  Filled 2019-08-13 (×2): qty 1

## 2019-08-13 MED ORDER — ONDANSETRON HCL 4 MG/2ML IJ SOLN: 4.0000 mg | Freq: Four times a day (QID) | INTRAMUSCULAR | Status: DC | PRN

## 2019-08-13 MED ORDER — HYDROMORPHONE HCL 2 MG PO TABS
2.0000 mg | ORAL_TABLET | ORAL | Status: DC | PRN
  Administered 2019-08-14 – 2019-08-15 (×5): 2 mg via ORAL
  Filled 2019-08-13 (×5): qty 1

## 2019-08-13 MED ORDER — BUPIVACAINE IN DEXTROSE 0.75-8.25 % IT SOLN
INTRATHECAL | Status: DC | PRN
  Administered 2019-08-13: 1.8 mL via INTRATHECAL

## 2019-08-13 MED ORDER — CHLORHEXIDINE GLUCONATE 4 % EX LIQD: 60.0000 mL | Freq: Once | CUTANEOUS | Status: DC

## 2019-08-13 MED ORDER — DIPHENHYDRAMINE HCL 12.5 MG/5ML PO ELIX: 12.5000 mg | ORAL_SOLUTION | ORAL | Status: DC | PRN

## 2019-08-13 MED ORDER — AMLODIPINE BESYLATE 10 MG PO TABS
10.0000 mg | ORAL_TABLET | Freq: Every day | ORAL | Status: DC
  Administered 2019-08-14 – 2019-08-15 (×2): 10 mg via ORAL
  Filled 2019-08-13 (×3): qty 1

## 2019-08-13 MED ORDER — POLYETHYLENE GLYCOL 3350 17 G PO PACK
17.0000 g | PACK | Freq: Every day | ORAL | Status: DC | PRN
  Administered 2019-08-13: 17 g via ORAL
  Filled 2019-08-13: qty 1

## 2019-08-13 MED ORDER — ALLOPURINOL 300 MG PO TABS
300.0000 mg | ORAL_TABLET | Freq: Every day | ORAL | Status: DC
  Administered 2019-08-14 – 2019-08-15 (×2): 300 mg via ORAL
  Filled 2019-08-13 (×3): qty 1

## 2019-08-13 MED ORDER — DOCUSATE SODIUM 100 MG PO CAPS
100.0000 mg | ORAL_CAPSULE | Freq: Two times a day (BID) | ORAL | Status: DC
  Administered 2019-08-13 – 2019-08-15 (×5): 100 mg via ORAL
  Filled 2019-08-13 (×5): qty 1

## 2019-08-13 MED ORDER — PROPOFOL 10 MG/ML IV BOLUS
INTRAVENOUS | Status: AC
  Filled 2019-08-13: qty 20

## 2019-08-13 MED ORDER — PHENOL 1.4 % MT LIQD: 1.0000 | OROMUCOSAL | Status: DC | PRN

## 2019-08-13 MED ORDER — FAMOTIDINE 20 MG PO TABS
40.0000 mg | ORAL_TABLET | Freq: Every day | ORAL | Status: DC
  Administered 2019-08-14 – 2019-08-15 (×2): 40 mg via ORAL
  Filled 2019-08-13 (×2): qty 2

## 2019-08-13 MED ORDER — FENTANYL CITRATE (PF) 250 MCG/5ML IJ SOLN
INTRAMUSCULAR | Status: AC
  Filled 2019-08-13: qty 5

## 2019-08-13 MED ORDER — MENTHOL 3 MG MT LOZG: 1.0000 | LOZENGE | OROMUCOSAL | Status: DC | PRN

## 2019-08-13 MED ORDER — LEVOTHYROXINE SODIUM 25 MCG PO TABS
25.0000 ug | ORAL_TABLET | Freq: Every day | ORAL | Status: DC
  Administered 2019-08-14 – 2019-08-15 (×2): 25 ug via ORAL
  Filled 2019-08-13 (×3): qty 1

## 2019-08-13 MED ORDER — CEFAZOLIN SODIUM-DEXTROSE 1-4 GM/50ML-% IV SOLN
1.0000 g | Freq: Four times a day (QID) | INTRAVENOUS | Status: AC
Start: 2019-08-13 — End: 2019-08-14
  Administered 2019-08-13 – 2019-08-14 (×2): 1 g via INTRAVENOUS
  Filled 2019-08-13 (×2): qty 50

## 2019-08-13 MED ORDER — METOCLOPRAMIDE HCL 5 MG PO TABS: 5.0000 mg | ORAL_TABLET | Freq: Three times a day (TID) | ORAL | Status: DC | PRN

## 2019-08-13 MED ORDER — ONDANSETRON HCL 4 MG PO TABS: 4.0000 mg | ORAL_TABLET | Freq: Four times a day (QID) | ORAL | Status: DC | PRN

## 2019-08-13 MED ORDER — PANTOPRAZOLE SODIUM 40 MG PO TBEC
40.0000 mg | DELAYED_RELEASE_TABLET | Freq: Every day | ORAL | Status: DC
  Administered 2019-08-14 – 2019-08-15 (×2): 40 mg via ORAL
  Filled 2019-08-13 (×3): qty 1

## 2019-08-13 MED ORDER — ACETAMINOPHEN 325 MG PO TABS: 325.0000 mg | ORAL_TABLET | Freq: Four times a day (QID) | ORAL | Status: DC | PRN

## 2019-08-13 MED ORDER — ALUM & MAG HYDROXIDE-SIMETH 200-200-20 MG/5ML PO SUSP: 30.0000 mL | ORAL | Status: DC | PRN

## 2019-08-13 MED ORDER — TRANEXAMIC ACID-NACL 1000-0.7 MG/100ML-% IV SOLN
INTRAVENOUS | Status: AC
  Filled 2019-08-13: qty 100

## 2019-08-13 MED ORDER — TRANEXAMIC ACID-NACL 1000-0.7 MG/100ML-% IV SOLN
1000.0000 mg | INTRAVENOUS | Status: AC
  Administered 2019-08-13: 1000 mg via INTRAVENOUS

## 2019-08-13 MED ORDER — SODIUM CHLORIDE 0.9 % IR SOLN
Status: DC | PRN
  Administered 2019-08-13: 3000 mL

## 2019-08-13 MED ORDER — CEFAZOLIN SODIUM-DEXTROSE 2-4 GM/100ML-% IV SOLN
INTRAVENOUS | Status: AC
  Filled 2019-08-13: qty 100

## 2019-08-13 MED ORDER — ASCORBIC ACID 500 MG PO TABS
1000.0000 mg | ORAL_TABLET | Freq: Two times a day (BID) | ORAL | Status: DC
  Administered 2019-08-13 – 2019-08-15 (×4): 1000 mg via ORAL
  Filled 2019-08-13 (×4): qty 2

## 2019-08-13 SURGICAL SUPPLY — 61 items
APL SKNCLS STERI-STRIP NONHPOA (GAUZE/BANDAGES/DRESSINGS) ×1
ARTICULEZE HEAD (Hips) ×2 IMPLANT
BENZOIN TINCTURE PRP APPL 2/3 (GAUZE/BANDAGES/DRESSINGS) ×2 IMPLANT
BLADE CLIPPER SURG (BLADE) IMPLANT
BLADE SAW SGTL 18X1.27X75 (BLADE) ×2 IMPLANT
COVER SURGICAL LIGHT HANDLE (MISCELLANEOUS) ×2 IMPLANT
COVER WAND RF STERILE (DRAPES) ×2 IMPLANT
CUP ACET PNNCL SECTR W/GRIP 56 (Hips) IMPLANT
DRAPE C-ARM 42X72 X-RAY (DRAPES) ×2 IMPLANT
DRAPE STERI IOBAN 125X83 (DRAPES) ×2 IMPLANT
DRAPE U-SHAPE 47X51 STRL (DRAPES) ×6 IMPLANT
DRESSING AQUACEL AG SP 3.5X10 (GAUZE/BANDAGES/DRESSINGS) IMPLANT
DRSG AQUACEL AG ADV 3.5X10 (GAUZE/BANDAGES/DRESSINGS) ×2 IMPLANT
DRSG AQUACEL AG SP 3.5X10 (GAUZE/BANDAGES/DRESSINGS) ×2
DRSG XEROFORM 1X8 (GAUZE/BANDAGES/DRESSINGS) ×1 IMPLANT
DURAPREP 26ML APPLICATOR (WOUND CARE) ×2 IMPLANT
ELECT BLADE 4.0 EZ CLEAN MEGAD (MISCELLANEOUS) ×2
ELECT BLADE 6.5 EXT (BLADE) IMPLANT
ELECT REM PT RETURN 9FT ADLT (ELECTROSURGICAL) ×2
ELECTRODE BLDE 4.0 EZ CLN MEGD (MISCELLANEOUS) ×1 IMPLANT
ELECTRODE REM PT RTRN 9FT ADLT (ELECTROSURGICAL) ×1 IMPLANT
FACESHIELD WRAPAROUND (MASK) ×4 IMPLANT
FACESHIELD WRAPAROUND OR TEAM (MASK) ×2 IMPLANT
GLOVE BIOGEL PI IND STRL 8 (GLOVE) ×2 IMPLANT
GLOVE BIOGEL PI INDICATOR 8 (GLOVE) ×2
GLOVE ECLIPSE 8.0 STRL XLNG CF (GLOVE) ×2 IMPLANT
GLOVE ORTHO TXT STRL SZ7.5 (GLOVE) ×4 IMPLANT
GOWN STRL REUS W/ TWL LRG LVL3 (GOWN DISPOSABLE) ×2 IMPLANT
GOWN STRL REUS W/ TWL XL LVL3 (GOWN DISPOSABLE) ×2 IMPLANT
GOWN STRL REUS W/TWL LRG LVL3 (GOWN DISPOSABLE) ×4
GOWN STRL REUS W/TWL XL LVL3 (GOWN DISPOSABLE) ×4
HANDPIECE INTERPULSE COAX TIP (DISPOSABLE) ×2
HEAD ARTICULEZE (Hips) IMPLANT
HEAD M SROM 36MM PLUS 1.5 (Hips) IMPLANT
KIT BASIN OR (CUSTOM PROCEDURE TRAY) ×2 IMPLANT
KIT TURNOVER KIT B (KITS) ×2 IMPLANT
LINER NEUTRAL 52MMX36MMX56N (Liner) ×1 IMPLANT
MANIFOLD NEPTUNE II (INSTRUMENTS) ×2 IMPLANT
NS IRRIG 1000ML POUR BTL (IV SOLUTION) ×2 IMPLANT
PACK TOTAL JOINT (CUSTOM PROCEDURE TRAY) ×2 IMPLANT
PAD ARMBOARD 7.5X6 YLW CONV (MISCELLANEOUS) ×2 IMPLANT
PINN SECTOR W/GRIP ACE CUP 56 (Hips) ×2 IMPLANT
SET HNDPC FAN SPRY TIP SCT (DISPOSABLE) ×1 IMPLANT
SROM M HEAD 36MM PLUS 1.5 (Hips) ×2 IMPLANT
STAPLER VISISTAT 35W (STAPLE) ×1 IMPLANT
STEM CORAIL HC SZ14 135 (Stem) ×1 IMPLANT
STRIP CLOSURE SKIN 1/2X4 (GAUZE/BANDAGES/DRESSINGS) ×4 IMPLANT
SUT ETHIBOND NAB CT1 #1 30IN (SUTURE) ×2 IMPLANT
SUT MNCRL AB 4-0 PS2 18 (SUTURE) IMPLANT
SUT VIC AB 0 CT1 27 (SUTURE) ×2
SUT VIC AB 0 CT1 27XBRD ANBCTR (SUTURE) ×1 IMPLANT
SUT VIC AB 1 CT1 27 (SUTURE) ×2
SUT VIC AB 1 CT1 27XBRD ANBCTR (SUTURE) ×1 IMPLANT
SUT VIC AB 2-0 CT1 27 (SUTURE) ×2
SUT VIC AB 2-0 CT1 TAPERPNT 27 (SUTURE) ×1 IMPLANT
TOWEL GREEN STERILE (TOWEL DISPOSABLE) ×2 IMPLANT
TOWEL GREEN STERILE FF (TOWEL DISPOSABLE) ×2 IMPLANT
TRAY CATH 16FR W/PLASTIC CATH (SET/KITS/TRAYS/PACK) IMPLANT
TRAY FOLEY W/BAG SLVR 16FR (SET/KITS/TRAYS/PACK)
TRAY FOLEY W/BAG SLVR 16FR ST (SET/KITS/TRAYS/PACK) IMPLANT
WATER STERILE IRR 1000ML POUR (IV SOLUTION) ×4 IMPLANT

## 2019-08-13 NOTE — Plan of Care (Signed)
Pt arrived to 5N this afternoon from PACU. Plan of care reviewed with pt and wife at bedside. Left hip incision CDI with OR dressing in place, ice pack present. Pt due to void. SCDs in place. Pain medications given per orders. Allergies reviewed with pt. Neurovascular checks performed. Pt up to chair with walker x1 assist. Call bell in reach. Pt stable at this time, will continue to monitor.  Problem: Education: Goal: Knowledge of General Education information will improve Description: Including pain rating scale, medication(s)/side effects and non-pharmacologic comfort measures Outcome: Progressing   Problem: Health Behavior/Discharge Planning: Goal: Ability to manage health-related needs will improve Outcome: Progressing   Problem: Clinical Measurements: Goal: Ability to maintain clinical measurements within normal limits will improve Outcome: Progressing Goal: Will remain free from infection Outcome: Progressing Goal: Diagnostic test results will improve Outcome: Progressing Goal: Respiratory complications will improve Outcome: Progressing Goal: Cardiovascular complication will be avoided Outcome: Progressing   Problem: Activity: Goal: Risk for activity intolerance will decrease Outcome: Progressing   Problem: Nutrition: Goal: Adequate nutrition will be maintained Outcome: Progressing   Problem: Coping: Goal: Level of anxiety will decrease Outcome: Progressing   Problem: Elimination: Goal: Will not experience complications related to bowel motility Outcome: Progressing Goal: Will not experience complications related to urinary retention Outcome: Progressing   Problem: Pain Managment: Goal: General experience of comfort will improve Outcome: Progressing   Problem: Safety: Goal: Ability to remain free from injury will improve Outcome: Progressing   Problem: Skin Integrity: Goal: Risk for impaired skin integrity will decrease Outcome: Progressing   Problem:  Activity: Goal: Ability to avoid complications of mobility impairment will improve Outcome: Progressing Goal: Ability to tolerate increased activity will improve Outcome: Progressing   Problem: Education: Goal: Verbalization of understanding the information provided will improve Outcome: Progressing   Problem: Coping: Goal: Level of anxiety will decrease Outcome: Progressing   Problem: Physical Regulation: Goal: Postoperative complications will be avoided or minimized Outcome: Progressing   Problem: Respiratory: Goal: Ability to maintain a clear airway will improve Outcome: Progressing   Problem: Pain Management: Goal: Pain level will decrease Outcome: Progressing   Problem: Skin Integrity: Goal: Signs of wound healing will improve Outcome: Progressing   Problem: Tissue Perfusion: Goal: Ability to maintain adequate tissue perfusion will improve Outcome: Progressing

## 2019-08-13 NOTE — Care Plan (Signed)
Ortho Bundle Case Management Note  Patient Details  Name: Troy Banks MRN: [id number] Date of Birth: 1941-03-30  [facility] call to patient to discuss his upcoming Left THA with Dr. Ninfa Linden on 08/13/19. Patient is an Ortho bundle patient with [facility]N/TOM. Reviewed this with his wife, who RNCM spoke with for pre-op call. Reviewed all pre- and post-op instructions. Patient has family/wife that will assist after surgery. He has all DME needed. Anticipate home health PT will be needed after short hospital stay. [facility] after short hospital stay. Referral made and [place] liaison cannot accommodate referral at this time due to insurance. Referral made to [facility]. Wife/patient made aware. Will continue to follow for all CM needs                  DME Arranged:  (No DME needed; patient has RW, 3in1 and cane at home for use) DME Agency:     [place]:  PT [place]:  [facility] (now [facility])  Additional Comments: Please contact me with any questions of if this plan should need to change.  Jamse Arn, RN, BSN, [employer]  [phone] 08/13/2019, 10:44 AM

## 2019-08-13 NOTE — Brief Op Note (Signed)
08/13/2019  1:08 PM  PATIENT:  [name]  79 y.o. male  PRE-OPERATIVE DIAGNOSIS:  osteoarthritis left hip  POST-OPERATIVE DIAGNOSIS:  osteoarthritis left hip  PROCEDURE:  Procedure(s): LEFT TOTAL HIP ARTHROPLASTY ANTERIOR APPROACH (Left)  SURGEON:  Surgeon(s) and Role:    Mcarthur Rossetti, MD - Primary  PHYSICIAN ASSISTANT:  Benita Stabile, [name]  ANESTHESIA:   spinal  EBL:  250 mL   COUNTS:  YES  DICTATION: .Other Dictation: Dictation Number [id number]  PLAN OF CARE: Admit for overnight observation  PATIENT DISPOSITION:  PACU - hemodynamically stable.   Delay start of Pharmacological VTE agent (>24hrs) due to surgical blood loss or risk of bleeding: no

## 2019-08-13 NOTE — Anesthesia Procedure Notes (Signed)
Spinal  Patient location during procedure: OR Start time: 08/13/2019 11:43 AM End time: 08/13/2019 11:45 AM Staffing Performed: anesthesiologist  Anesthesiologist: Roderic Palau, MD Preanesthetic Checklist Completed: patient identified, IV checked, risks and benefits discussed, surgical consent, monitors and equipment checked, pre-op evaluation and timeout performed Spinal Block Patient position: sitting Prep: DuraPrep Patient monitoring: cardiac monitor, continuous pulse ox and blood pressure Approach: midline Location: L3-4 Injection technique: single-shot Needle Needle type: Pencan  Needle gauge: 24 G Needle length: 9 cm Assessment Sensory level: T8 Additional Notes Functioning IV was confirmed and monitors were applied. Sterile prep and drape, including hand hygiene and sterile gloves were used. The patient was positioned and the spine was prepped. The skin was anesthetized with lidocaine.  Free flow of clear CSF was obtained prior to injecting local anesthetic into the CSF.  The spinal needle aspirated freely following injection.  The needle was carefully withdrawn.  The patient tolerated the procedure well.

## 2019-08-13 NOTE — Evaluation (Signed)
Physical Therapy Evaluation Patient Details Name: Troy Banks MRN: [id number] DOB: 01-04-1941 Today's Date: 08/13/2019   History of Present Illness  Pt is a 79 y/o male s/p L THA, direct anterior. PMH includes HTN, DM, CAD s/p CABG and dCHF.   Clinical Impression  Pt is s/p surgery above with deficits below. Pt with increased pain, so mobility limited to chair. Required min guard A for mobility using RW. Anticipate pt will progress well once pain controlled. Will continue to follow acutely to maximize functional mobility independence and safety.     Follow Up Recommendations Follow surgeon's recommendation for DC plan and follow-up therapies;Supervision for mobility/OOB    Equipment Recommendations  None recommended by PT    Recommendations for Other Services       Precautions / Restrictions Precautions Precautions: None Restrictions Weight Bearing Restrictions: Yes LLE Weight Bearing: Weight bearing as tolerated      Mobility  Bed Mobility Overal bed mobility: Needs Assistance Bed Mobility: Supine to Sit     Supine to sit: Min assist     General bed mobility comments: Min A for LLE assist to come to sitting. Increased time required.   Transfers Overall transfer level: Needs assistance Equipment used: Rolling walker (2 wheeled) Transfers: Sit to/from Stand Sit to Stand: Min assist         General transfer comment: Min A for lift assist and steadying to stand. Cues for safe hand placement.   Ambulation/Gait Ambulation/Gait assistance: Min guard Gait Distance (Feet): 3 Feet Assistive device: Rolling walker (2 wheeled) Gait Pattern/deviations: Step-to pattern;Decreased step length - right;Decreased step length - left;Decreased weight shift to left;Antalgic Gait velocity: Decreased   General Gait Details: Slow, antalgic gait. Cues for sequencing using RW. Distance limited to chair secondary to pain.   Stairs            Wheelchair Mobility     Modified Rankin (Stroke Patients Only)       Balance Overall balance assessment: Needs assistance Sitting-balance support: No upper extremity supported;Feet supported Sitting balance-Leahy Scale: Fair     Standing balance support: Bilateral upper extremity supported;During functional activity Standing balance-Leahy Scale: Poor Standing balance comment: Reliant on BUE support                              Pertinent Vitals/Pain Pain Assessment: 0-10 Pain Score: 7  Pain Location: L hip  Pain Descriptors / Indicators: Aching;Operative site guarding Pain Intervention(s): Limited activity within patient's tolerance;Monitored during session;Repositioned    Home Living Family/patient expects to be discharged to:: Private residence Living Arrangements: Spouse/significant other Available Help at Discharge: Family Type of Home: House Home Access: Stairs to enter Entrance Stairs-Rails: Right;Left;Can reach both Technical brewer of Steps: 4 Home Layout: One level Home Equipment: Environmental consultant - 2 wheels;Bedside commode;Cane - single point      Prior Function Level of Independence: Independent               Hand Dominance        Extremity/Trunk Assessment   Upper Extremity Assessment Upper Extremity Assessment: Overall WFL for tasks assessed    Lower Extremity Assessment Lower Extremity Assessment: LLE deficits/detail LLE Deficits / Details: Deficits consistent with post op pain and weakness. Pt also with limited ankle PF secondary to previous surgery.     Cervical / Trunk Assessment Cervical / Trunk Assessment: Normal  Communication   Communication: No difficulties  Cognition Arousal/Alertness: Awake/alert Behavior During Therapy:  WFL for tasks assessed/performed Overall Cognitive Status: Within Functional Limits for tasks assessed                                        General Comments General comments (skin integrity, edema,  etc.): Pt's wife present during session.     Exercises     Assessment/Plan    PT Assessment Patient needs continued PT services  PT Problem List Decreased strength;Decreased range of motion;Decreased activity tolerance;Decreased balance;Decreased mobility;Pain       PT Treatment Interventions DME instruction;Gait training;Functional mobility training;Balance training;Therapeutic exercise;Therapeutic activities;Stair training    PT Goals (Current goals can be found in the Care Plan section)  Acute Rehab PT Goals Patient Stated Goal: to go home PT Goal Formulation: With patient Time For Goal Achievement: 08/27/19 Potential to Achieve Goals: Good    Frequency 7X/week   Barriers to discharge        Co-evaluation               AM-PAC PT "6 Clicks" Mobility  Outcome Measure Help needed turning from your back to your side while in a flat bed without using bedrails?: A Little Help needed moving from lying on your back to sitting on the side of a flat bed without using bedrails?: A Little Help needed moving to and from a bed to a chair (including a wheelchair)?: A Little Help needed standing up from a chair using your arms (e.g., wheelchair or bedside chair)?: A Little Help needed to walk in hospital room?: A Little Help needed climbing 3-5 steps with a railing? : A Lot 6 Click Score: 17    End of Session Equipment Utilized During Treatment: Gait belt Activity Tolerance: Patient limited by pain Patient left: in chair;with call bell/phone within reach;with family/visitor present Nurse Communication: Mobility status PT Visit Diagnosis: Other abnormalities of gait and mobility (R26.89);Pain Pain - Right/Left: Left Pain - part of body: Hip    Time: 1710-1727 PT Time Calculation (min) (ACUTE ONLY): 17 min   Charges:   PT Evaluation $PT Eval Low Complexity: 1 Low          Lou Miner, DPT  Acute Rehabilitation Services  Pager: [phone] Office: [phone]   Rudean Hitt 08/13/2019, 5:51 PM

## 2019-08-13 NOTE — Progress Notes (Signed)
Patient states that his home setting on CPAP is 14cmh2o. On hospital provided machine patient states that was too much pressure. Dropped to 12cmh2o per patient request and comfort. Patient is tolerating it 12cmh2o more comfortably w/o complications. Patient has his own circuit and nasal pillows

## 2019-08-13 NOTE — Progress Notes (Signed)
Orthopedic Tech Progress Note Patient Details:  Troy Banks 06/17/1940 [id number] [place] applied Over head Frame with Trapeze for patient. Patient ID: Troy Banks, male   DOB: [birth year], 79 y.o.   MRN: [id number]   Troy Banks 08/13/2019, 5:36 PM

## 2019-08-13 NOTE — Transfer of Care (Signed)
Immediate Anesthesia Transfer of Care Note  Patient: Troy Banks  Procedure(s) Performed: LEFT TOTAL HIP ARTHROPLASTY ANTERIOR APPROACH (Left Hip)  Patient Location: PACU  Anesthesia Type:MAC and Spinal  Level of Consciousness: awake and alert   Airway & Oxygen Therapy: Patient Spontanous Breathing and Patient connected to face mask oxygen  Post-op Assessment: Report given to RN and Post -op Vital signs reviewed and stable  Post vital signs: Reviewed and stable  Last Vitals:  Vitals Value Taken Time  BP 89/47 08/13/19 1311  Temp    Pulse 50 08/13/19 1311  Resp 16 08/13/19 1312  SpO2 97 % 08/13/19 1311  Vitals shown include unvalidated device data.  Last Pain: There were no vitals filed for this visit.       Complications: No apparent anesthesia complications

## 2019-08-13 NOTE — Op Note (Signed)
NAMENOCTIS, FIEN MEDICAL RECORD [id number] ACCOUNT [id number] DATE OF BIRTH:1941-02-18 FACILITY: [facility] LOCATION: [facility] PHYSICIAN:Myelle Poteat Kerry Fort, MD  OPERATIVE REPORT  DATE OF PROCEDURE:  08/13/2019  PREOPERATIVE DIAGNOSIS:  Primary osteoarthritis and degenerative joint disease, left hip.  POSTOPERATIVE DIAGNOSIS:  Primary osteoarthritis and degenerative joint disease, left hip.  PROCEDURE:  Left total hip arthroplasty through direct anterior approach.  IMPLANTS:  DePuy Sector Gription acetabular component size 56, size 36+0 neutral polyethylene liner, size 14 Corail femoral component with high offset, size 36+1.5 metal hip ball.  SURGEON:  Lind Guest.  Ninfa Linden, MD  ASSISTANT:  Erskine Emery, [name].  ANESTHESIA:  Spinal.  ANTIBIOTICS:  Two g IV Ancef.  ESTIMATED BLOOD LOSS:  250 mL.  COMPLICATIONS:  None.  INDICATIONS:  The patient is a 79 year old gentleman well known to me.  He has debilitating arthritis involving his left hip.  He actually underwent a successful right total hip arthroplasty in 2017 that we performed through a direct anterior approach.   He has done very well with that hip.  His left hip pain has now become daily.  His x-rays do document and confirm osteoarthritis of the left hip.  At this point, with the effect that this has had on his quality of life and mobility, as well as his daily  pain, he does wish to proceed with a total hip arthroplasty on the left side.  We had a long and thorough discussion about this.  Having had it done before, he is fully aware of the risk of acute blood loss anemia, nerve or vessel injury, fracture,  infection, dislocation, DVT and implant failure.  He understands our goals are to decrease pain, improve mobility and overall improve quality of life.  DESCRIPTION OF PROCEDURE:  After informed consent was obtained and appropriate left hip was marked, he was brought to the operating room and sat up on a  stretcher where spinal anesthesia was obtained.  He was then laid in supine position on a stretcher.   I assessed his leg lengths and found them to be equal.  Traction boots were placed on both his feet.  Next, I placed him supine on the Hana fracture table, the perineal post in place and both legs in line skeletal traction device and no traction  applied.  His left operative hip was prepped and draped with DuraPrep and sterile drapes.  A time-out was called.  He was identified as correct patient, correct left hip.  I then made an incision just inferior and posterior to the anterior iliac spine  and carried this obliquely down the leg.  We dissected down to the tensor fascia lata muscle.  Tensor fascia was then divided longitudinally to proceed with direct anterior approach to the hip.  We identified and cauterized circumflex vessels and  identified the hip capsule, opened up the hip capsule in an L-type format, finding moderate joint effusion and significant arthritis around the femoral head and neck.  I placed Cobra retractors around the medial and lateral femoral neck and then made our  femoral neck cut just proximal to the lesser trochanter with an oscillating saw and completed this with an osteotome.  We placed a corkscrew guide in the femoral head and removed the femoral head in its entirety and found a wide area devoid of  cartilage.  I then placed a bent Hohmann over the medial acetabular rim and removed remnants of acetabular labrum and other debris and then began reaming under direct visualization from a  size 43 reamer, going up in stepwise increments to a size 55, with  all reamers under direct visualization, the last reamer under direct fluoroscopy, so we could obtain our depth in reaming our inclination and anteversion.  I then placed the real DePuy Sector Gription acetabular component, a size 56 and went with a 36+0  neutral polyethylene liner.  Attention was then turned to the femur.  The leg  was externally rotated to 120 degrees, extended and adducted it.  We were able to place a Mueller retractor medially and a Hohman retractor behind the greater trochanter.  We  released lateral joint capsule and used a box-cutting osteotome to enter the femoral canal and a rongeur to lateralize.  We then began broaching using the Corail broaching system from a size 8 going up to a size 14.  With a 14 in place, we trialed a  standard offset femoral neck and a 36+1.5 hip ball, reduced this into the acetabulum.  It did not feel stable to me and based on just seeing his offset, we felt like we should go with a high offset femoral component.  This was not available 2017.  His  other hip has been stable.  With that being said, we dislocated the hip and removed the trial components.  We went with the real high offset size 14 femoral component, which was a Corail and went with a 36+5 metal hip ball.  We tried to reduce this in  the acetabulum and it was actually too long, so we removed that +5 hip ball and went down to a 36+1.5 metal hip ball, again reduced this in the acetabulum and I was pleased with stability on clinical exam and assessing it radiographically.  I felt that  we had reapproximated his leg lengths and offset.  We then irrigated the wound with normal saline solution using pulsatile lavage.  We were able to close the joint capsule with interrupted #1 Ethibond suture.  #1 Vicryl was used to close the tensor  fascia, 0 Vicryl was used to close deep tissue, 2-0 Vicryl was used to close the subcutaneous tissue and we used interrupted staples to reapproximate the skin.  Xeroform and Aquacel dressing was applied.  He was taken off the Hana table and taken to the  recovery room in stable condition.  All final counts were correct.  There were no complications noted.  Of note, Benita Stabile, [name] did assist during the entire case.  His assistance was crucial for facilitating all aspects of this case.  VN/NUANCE   D:08/13/2019 T:08/13/2019 JOB:[id number]

## 2019-08-13 NOTE — H&P (Signed)
TOTAL HIP ADMISSION H&P  Patient is admitted for left total hip arthroplasty.  Subjective:  Chief Complaint: left hip pain  HPI: Troy Banks, 79 y.o. male, has a history of pain and functional disability in the left hip(s) due to arthritis and patient has failed non-surgical conservative treatments for greater than 12 weeks to include NSAID's and/or analgesics, corticosteriod injections, flexibility and strengthening excercises, use of assistive devices, weight reduction as appropriate and activity modification.  Onset of symptoms was gradual starting 3 years ago with gradually worsening course since that time.The patient noted no past surgery on the left hip(s).  Patient currently rates pain in the left hip at 10 out of 10 with activity. Patient has night pain, worsening of pain with activity and weight bearing, pain that interfers with activities of daily living and pain with passive range of motion. Patient has evidence of subchondral sclerosis, periarticular osteophytes and joint space narrowing by imaging studies. This condition presents safety issues increasing the risk of falls.  There is no current active infection.  Patient Active Problem List   Diagnosis Date Noted  . Unilateral primary osteoarthritis, left hip 08/13/2019  . Abdominal wall hernia 11/02/2018  . Progressive angina ([place]) 10/25/2018  . DOE (dyspnea on exertion) 10/25/2018  . Abscess of right buttock 11/27/2017  . Acute sinus infection 11/05/2017  . Pilon fracture 03/17/2017  . Pilon fracture of left tibia 03/17/2017  . At risk for adverse drug reaction 02/28/2017  . Closed fracture dislocation of ankle joint, left, with delayed healing, subsequent encounter 02/22/2017  . Renal stone 10/26/2016  . Gross hematuria 10/26/2016  . Bladder neck obstruction 10/26/2016  . External hemorrhoid, thrombosed 06/10/2016  . Internal hemorrhoids 06/10/2016  . Lumbar back pain 05/19/2016  . Osteoarthritis of right hip  10/27/2015  . Encounter for well adult exam with abnormal findings 10/27/2015  . Chronic chest pain 09/22/2015  . Acute gouty arthritis 06/11/2015  . Coronary artery disease involving native coronary artery with angina pectoris ([place]) 06/11/2015  . Chronic diastolic CHF (congestive heart failure) ([place]) 06/11/2015  . H/O tissue AVR Oct 2016 06/10/2015  . PAF post CABG/AVR- Amiodarone 06/10/2015  . Dyslipidemia 06/10/2015  . S/P CABG x 4-Oct 2016 03/30/2015  . H/O NSTEMI-Oct 2016 03/27/2015  . 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 Depression 09/08/2014  . GERD (gastroesophageal reflux disease)   . Essential tremor 03/31/2014  . Iliotibial band syndrome affecting left lower leg 01/01/2014  . Spondylolisthesis at L4-L5 level 01/01/2014  . Sinus bradycardia 12/31/2013  . Greater trochanteric bursitis of right hip 12/04/2013  . OSA on CPAP   . Diet-controlled diabetes mellitus ([place]) 07/08/2009  . COLONIC POLYPS, HX OF 12/31/2008  . GOUT 12/22/2008  . Essential hypertension 12/22/2008  . Diverticulosis of colon 12/22/2008  . NEPHROLITHIASIS, HX OF 12/22/2008   Past Medical History:  Diagnosis Date  . Allergy   . Anxiety    PTSD  . Aortic stenosis, moderate 03/27/2015   post AVR echo Echo 11/16: Mild LVH, EF 55-60%, normal wall motion, grade 1 diastolic dysfunction, bioprosthetic AVR okay (mean gradient 18 mmHg) with trivial AI, mild LAE, atrial septal aneurysm, small effusion   . Arthritis    "qwhere" (10/29/2015)  . Cancer ([place])    skin cancer  . Carotid artery stenosis    Carotid US 11/17: 1-39% bilateral ICA stenosis. F/u prn  . Cervical stenosis of spinal canal    fusion 2006  . CHF (congestive heart  failure) ([place])    "secondry to OHS"  . Closed left pilon fracture   . COLONIC POLYPS, HX OF   . Complication of anesthesia    "takes a lot to put him under and has woken up during surgery" Difficult to wake up . PTSD; "does not metabolize RX  well"that he gets "violent", per pt.   . Coronary artery disease   . DIVERTICULOSIS, COLON   . Dysrhythmia    2 bouts of afib post op and at home but corrected with medication.  . Familial tremor 03/19/2014  . Family history of adverse reaction to anesthesia    Father - hold to get asleep  . GERD (gastroesophageal reflux disease)    uses Zantac  . GOUT   . Headache   . Heart murmur    had aortic value replacement  . High cholesterol   . History of blood transfusion   . History of kidney stones    surgery  . HYPERTENSION    off ACEI 2010 because of hyperkalemia in setting of ARI  . Hypothyroidism    newly diaganosed 09/2015  . Myocardial infarction [facility])    October 20th 2016  . OSA on CPAP   . Pneumonia   . 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 PTSD (post-traumatic stress disorder)   . PTSD (post-traumatic stress disorder)   . Sleep apnea    On a CPAP    Past Surgical History:  Procedure Laterality Date  . ANKLE FUSION Left 03/17/2017   Procedure: FUSION  PILON FRACTURE WITH BONE GRAFTING;  Surgeon: Shona Needles, MD;  Location: [place];  Service: Orthopedics;  Laterality: Left;  . ANTERIOR FUSION CERVICAL SPINE  2002  . AORTIC VALVE REPLACEMENT N/A 03/30/2015   Procedure: AORTIC VALVE REPLACEMENT (AVR);  Surgeon: Grace Isaac, MD;  Location: [place];  Service: Open Heart Surgery;  Laterality: N/A;  . CARDIAC CATHETERIZATION N/A 03/26/2015   Procedure: Left Heart Cath and Coronary Angiography;  Surgeon: Leonie Man, MD;  Location: [place] [facility];  Service: Cardiovascular;  Laterality: N/A;  . CARDIAC VALVE REPLACEMENT    . COLONOSCOPY W/ POLYPECTOMY    . CORONARY ARTERY BYPASS GRAFT N/A 03/30/2015   Procedure: CORONARY ARTERY BYPASS GRAFTING (CABG) x four, using left internal mammary artery and left    leg greater saphenous vein harvested endoscopically ;  Surgeon: Grace Isaac, MD;   Location: [place];  Service: Open Heart Surgery;  Laterality: N/A;  . CYSTOSCOPY/URETEROSCOPY/HOLMIUM LASER/STENT PLACEMENT Bilateral 11/07/2016   Procedure: CYSTOSCOPY/URETEROSCOPY/HOLMIUM LASER/STENT PLACEMENT;  Surgeon: Nickie Retort, MD;  Location: [facility];  Service: Urology;  Laterality: Bilateral;  . CYSTOSCOPY/URETEROSCOPY/HOLMIUM LASER/STENT PLACEMENT Bilateral 11/23/2016   Procedure: CYSTOSCOPY/BILATERAL URETEROSCOPY/HOLMIUM LASER/STENT EXCHANGE;  Surgeon: Nickie Retort, MD;  Location: [facility];  Service: Urology;  Laterality: Bilateral;  NEEDS 90 MIN   . EXTERNAL FIXATION LEG Left 02/22/2017   Procedure: EXTERNAL FIXATION LEFT ANKLE;  Surgeon: Renette Butters, MD;  Location: [place];  Service: Orthopedics;  Laterality: Left;  . HEMORRHOID BANDING  1970s  . INGUINAL HERNIA REPAIR Left 1962  . [place]   "cut me open"  . LEFT HEART CATH AND CORS/GRAFTS ANGIOGRAPHY N/A 10/25/2018   Procedure: LEFT HEART CATH AND CORS/GRAFTS ANGIOGRAPHY;  Surgeon: Leonie Man, MD;  Location: [place];  Service: Cardiovascular;  Laterality: N/A;  . POSTERIOR FUSION CERVICAL SPINE  2006  . TEE WITHOUT CARDIOVERSION N/A 03/30/2015   Procedure: TRANSESOPHAGEAL ECHOCARDIOGRAM (TEE);  Surgeon: Grace Isaac, MD;  Location: [place];  Service: Open Heart Surgery;  Laterality: N/A;  . TESTICLE TORSION REDUCTION Right 1960  . TOE FUSION Left 1985 & 2004   "great toe" and removal  . TONSILLECTOMY    . TOTAL HIP ARTHROPLASTY Right 10/27/2015   Procedure: RIGHT TOTAL HIP ARTHROPLASTY ANTERIOR APPROACH and steroid injection right foot;  Surgeon: Mcarthur Rossetti, MD;  Location: [facility];  Service: Orthopedics;  Laterality: Right;    Current Facility-Administered Medications  Medication Dose Route Frequency Provider Last Rate Last Admin  . ceFAZolin (ANCEF) 2-4 GM/100ML-% IVPB           . ceFAZolin (ANCEF) IVPB 2g/100 mL premix  2 g Intravenous On Call to OR Pete Pelt,  [name]      . chlorhexidine (HIBICLENS) 4 % liquid 4 application  60 mL Topical Once Pete Pelt, [name]      . lactated ringers infusion   Intravenous Continuous Roderic Palau, MD 10 mL/hr at 08/13/19 1044 New Bag at 08/13/19 1044  . povidone-iodine 10 % swab 2 application  2 application Topical Once Erskine Emery W, [name]      . tranexamic acid (CYKLOKAPRON) 1000MG /152mL IVPB           . tranexamic acid (CYKLOKAPRON) IVPB 1,000 mg  1,000 mg Intravenous To OR Pete Pelt, [name]       Allergies  Allergen Reactions  . Acyclovir And Related Other (See Comments)    Accumulates and causes stroke-like side-effects due to cytochrome P450 enzyme deficiency  . Benzodiazepines Other (See Comments)    Patient states he gets stroke symptoms with benzo's.   . Compazine [Prochlorperazine Maleate] Other (See Comments)    Accumulates and causes stroke-like side-effects due to cytochrome P450 enzyme deficiency  . Coreg [Carvedilol] Other (See Comments)    Accumulates and causes stroke-like side-effects due to cytochrome P450 enzyme deficiency Patient poor metabolizer of CYP2D6 - Coreg undergoes extensive hepatic (including 2D6)  . Flomax [Tamsulosin Hcl] Other (See Comments)    Accumulates and causes stroke-like side-effects due to cytochrome P450 enzyme deficiency  . Lisinopril Other (See Comments)    Hyperkalemia. 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  . Mobic [Meloxicam] Other (See Comments)    Accumulates and causes stroke-like side-effects due to cytochrome P450 enzyme deficiency  . Ondansetron Other (See Comments)    Accumulates and causes stroke-like side-effects due to cytochrome P450 enzyme deficiency   . Promethazine Other (See Comments)    Accumulates and causes stroke-like side-effects due to cytochrome P450 enzyme deficiency  . Sertraline Hcl Other (See Comments)    Accumulates and causes stroke-like side-effects due to  cytochrome P450 enzyme deficiency  . Tramadol Other (See Comments)    Accumulates and causes stroke-like side-effects due to cytochrome P450 enzyme deficiency  . Atorvastatin Other (See Comments)    MYALGIAS  . Colchicine Other (See Comments)    ataxia    Social History   Tobacco Use  . Smoking status: Former Smoker    Packs/day: 1.00    Years: 34.00    Pack years: 34.00    Types: Cigarettes    Quit date: 06/06/1990    Years since quitting: 29.2  . Smokeless tobacco: Never Used  Substance Use Topics  . Alcohol use: Yes    Alcohol/week: 3.0 standard drinks    Types: 1 Cans  of beer, 2 Shots of liquor per week    Family History  Problem Relation Age of Onset  . Heart disease Mother   . Hypertension Mother   . Diabetes Mother   . Heart disease Father   . Hypertension Father   . Tremor Sister   . Healthy Brother   . Healthy Son   . Prostate cancer Maternal Uncle   . Hypertension Maternal Grandmother   . Heart disease Maternal Grandmother   . Hypertension Maternal Grandfather   . Heart disease Maternal Grandfather   . Hypertension Paternal Grandfather   . Heart disease Paternal Grandfather   . Diabetes Paternal Grandfather   . Alcohol abuse Other   . Arthritis Other   . Drug abuse Sister   . Healthy Daughter   . Colon polyps Neg Hx   . Esophageal cancer Neg Hx   . Rectal cancer Neg Hx   . Stomach cancer Neg Hx   . Colon cancer Neg Hx      Review of Systems  All other systems reviewed and are negative.   Objective:  Physical Exam  Constitutional: He is oriented to person, place, and time. He appears well-developed and well-nourished.  HENT:  Head: Normocephalic and atraumatic.  Eyes: Pupils are equal, round, and reactive to light. EOM are normal.  Cardiovascular: Normal rate.  Respiratory: Effort normal.  GI: Soft.  Musculoskeletal:     Cervical back: Normal range of motion and neck supple.     Left hip: Tenderness and bony tenderness present. Decreased  range of motion. Decreased strength.  Neurological: He is alert and oriented to person, place, and time.  Skin: Skin is warm and dry.  Psychiatric: He has a normal mood and affect.    Vital signs in last 24 hours: Temp:  [98.8 F (37.1 C)] 98.8 F (37.1 C) (03/09 1011) Pulse Rate:  [64] 64 (03/09 1011) Resp:  [18] 18 (03/09 1011) BP: (190)/(84) 190/84 (03/09 1011) SpO2:  [97 %] 97 % (03/09 1011) Weight:  [99.8 kg] 99.8 kg (03/09 1011)  Labs:   Estimated body mass index is 33.45 kg/m as calculated from the following:   Height as of this encounter: 5\' 8"  (1.727 m).   Weight as of this encounter: 99.8 kg.   Imaging Review Plain radiographs demonstrate severe degenerative joint disease of the left hip(s). The bone quality appears to be good for age and reported activity level.      Assessment/Plan:  End stage arthritis, left hip(s)  The patient history, physical examination, clinical judgement of the provider and imaging studies are consistent with end stage degenerative joint disease of the left hip(s) and total hip arthroplasty is deemed medically necessary. The treatment options including medical management, injection therapy, arthroscopy and arthroplasty were discussed at length. The risks and benefits of total hip arthroplasty were presented and reviewed. The risks due to aseptic loosening, infection, stiffness, dislocation/subluxation,  thromboembolic complications and other imponderables were discussed.  The patient acknowledged the explanation, agreed to proceed with the plan and consent was signed. Patient is being admitted for inpatient treatment for surgery, pain control, PT, OT, prophylactic antibiotics, VTE prophylaxis, progressive ambulation and ADL's and discharge planning.The patient is planning to be discharged home with home health services

## 2019-08-13 NOTE — Anesthesia Postprocedure Evaluation (Signed)
Anesthesia Post Note  Patient: Troy Banks  Procedure(s) Performed: LEFT TOTAL HIP ARTHROPLASTY ANTERIOR APPROACH (Left Hip)     Patient location during evaluation: PACU Anesthesia Type: MAC and Spinal Level of consciousness: oriented and awake and alert Pain management: pain level controlled Vital Signs Assessment: post-procedure vital signs reviewed and stable Respiratory status: spontaneous breathing and respiratory function stable Cardiovascular status: blood pressure returned to baseline and stable Postop Assessment: no headache, no backache, no apparent nausea or vomiting, spinal receding and patient able to bend at knees Anesthetic complications: no    Last Vitals:  Vitals:   08/13/19 1450 08/13/19 1529  BP:  (!) 165/83  Pulse: (!) 53 (!) 56  Resp:  18  Temp:  36.6 C  SpO2:  100%    Last Pain:  Vitals:   08/13/19 1530  TempSrc:   PainSc: 0-No pain                 Darol Cush,W. EDMOND

## 2019-08-14 ENCOUNTER — Encounter (HOSPITAL_COMMUNITY): Payer: Self-pay | Admitting: Orthopaedic Surgery

## 2019-08-14 DIAGNOSIS — Z8042 Family history of malignant neoplasm of prostate: Secondary | ICD-10-CM | POA: Diagnosis not present

## 2019-08-14 DIAGNOSIS — I110 Hypertensive heart disease with heart failure: Secondary | ICD-10-CM | POA: Diagnosis present

## 2019-08-14 DIAGNOSIS — E039 Hypothyroidism, unspecified: Secondary | ICD-10-CM | POA: Diagnosis present

## 2019-08-14 DIAGNOSIS — Z96641 Presence of right artificial hip joint: Secondary | ICD-10-CM | POA: Diagnosis present

## 2019-08-14 DIAGNOSIS — Z813 Family history of other psychoactive substance abuse and dependence: Secondary | ICD-10-CM | POA: Diagnosis not present

## 2019-08-14 DIAGNOSIS — Z981 Arthrodesis status: Secondary | ICD-10-CM | POA: Diagnosis not present

## 2019-08-14 DIAGNOSIS — Z833 Family history of diabetes mellitus: Secondary | ICD-10-CM | POA: Diagnosis not present

## 2019-08-14 DIAGNOSIS — Z87891 Personal history of nicotine dependence: Secondary | ICD-10-CM | POA: Diagnosis not present

## 2019-08-14 DIAGNOSIS — Z888 Allergy status to other drugs, medicaments and biological substances status: Secondary | ICD-10-CM | POA: Diagnosis not present

## 2019-08-14 DIAGNOSIS — Z85828 Personal history of other malignant neoplasm of skin: Secondary | ICD-10-CM | POA: Diagnosis not present

## 2019-08-14 DIAGNOSIS — Z87442 Personal history of urinary calculi: Secondary | ICD-10-CM | POA: Diagnosis not present

## 2019-08-14 DIAGNOSIS — I5032 Chronic diastolic (congestive) heart failure: Secondary | ICD-10-CM | POA: Diagnosis not present

## 2019-08-14 DIAGNOSIS — Z8249 Family history of ischemic heart disease and other diseases of the circulatory system: Secondary | ICD-10-CM | POA: Diagnosis not present

## 2019-08-14 DIAGNOSIS — Z86010 Personal history of colonic polyps: Secondary | ICD-10-CM | POA: Diagnosis not present

## 2019-08-14 DIAGNOSIS — I25119 Atherosclerotic heart disease of native coronary artery with unspecified angina pectoris: Secondary | ICD-10-CM | POA: Diagnosis not present

## 2019-08-14 DIAGNOSIS — M1612 Unilateral primary osteoarthritis, left hip: Secondary | ICD-10-CM | POA: Diagnosis not present

## 2019-08-14 DIAGNOSIS — I6523 Occlusion and stenosis of bilateral carotid arteries: Secondary | ICD-10-CM | POA: Diagnosis not present

## 2019-08-14 DIAGNOSIS — M109 Gout, unspecified: Secondary | ICD-10-CM | POA: Diagnosis present

## 2019-08-14 DIAGNOSIS — I2510 Atherosclerotic heart disease of native coronary artery without angina pectoris: Secondary | ICD-10-CM | POA: Diagnosis not present

## 2019-08-14 DIAGNOSIS — I219 Acute myocardial infarction, unspecified: Secondary | ICD-10-CM | POA: Diagnosis not present

## 2019-08-14 DIAGNOSIS — E7800 Pure hypercholesterolemia, unspecified: Secondary | ICD-10-CM | POA: Diagnosis not present

## 2019-08-14 DIAGNOSIS — F329 Major depressive disorder, single episode, unspecified: Secondary | ICD-10-CM | POA: Diagnosis present

## 2019-08-14 DIAGNOSIS — K219 Gastro-esophageal reflux disease without esophagitis: Secondary | ICD-10-CM | POA: Diagnosis not present

## 2019-08-14 DIAGNOSIS — F419 Anxiety disorder, unspecified: Secondary | ICD-10-CM | POA: Diagnosis present

## 2019-08-14 LAB — BASIC METABOLIC PANEL
Anion gap: 10 (ref 5–15)
BUN: 19 mg/dL (ref 8–23)
CO2: 23 mmol/L (ref 22–32)
Calcium: 8.7 mg/dL — ABNORMAL LOW (ref 8.9–10.3)
Chloride: 103 mmol/L (ref 98–111)
Creatinine, Ser: 0.97 mg/dL (ref 0.61–1.24)
GFR calc Af Amer: >60 mL/min (ref >60)
GFR calc non Af Amer: >60 mL/min (ref >60)
Glucose, Bld: 129 mg/dL — ABNORMAL HIGH (ref 70–99)
Potassium: 3.6 mmol/L (ref 3.5–5.1)
Sodium: 136 mmol/L (ref 135–145)

## 2019-08-14 LAB — CBC
HCT: 39.2 % (ref 39.0–52.0)
Hemoglobin: 13.3 g/dL (ref 13.0–17.0)
MCH: 31 pg (ref 26.0–34.0)
MCHC: 33.9 g/dL (ref 30.0–36.0)
MCV: 91.4 fL (ref 80.0–100.0)
Platelets: 141 10*3/uL — ABNORMAL LOW (ref 150–400)
RBC: 4.29 MIL/uL (ref 4.22–5.81)
RDW: 13.7 % (ref 11.5–15.5)
WBC: 8.2 10*3/uL (ref 4.0–10.5)
nRBC: 0 % (ref 0.0–0.2)

## 2019-08-14 MED ORDER — TIZANIDINE HCL 4 MG PO TABS: 4.0000 mg | ORAL_TABLET | Freq: Three times a day (TID) | ORAL | 1 refills | Status: DC | PRN

## 2019-08-14 MED ORDER — HYDROMORPHONE HCL 2 MG PO TABS: 2.0000 mg | ORAL_TABLET | ORAL | 0 refills | Status: DC | PRN

## 2019-08-14 MED ORDER — ASPIRIN 81 MG PO CHEW: 81.0000 mg | CHEWABLE_TABLET | Freq: Two times a day (BID) | ORAL | 0 refills | Status: DC

## 2019-08-14 MED ORDER — OXYCODONE HCL 5 MG PO TABS: 5.0000 mg | ORAL_TABLET | ORAL | 0 refills | Status: DC | PRN

## 2019-08-14 MED FILL — HYDROmorphone HCL 2 MG TABS: 2 | 5 days supply | Qty: 30 | Fill #0

## 2019-08-14 MED FILL — tiZANidine HCL 4 MG TABS: 4 | 14 days supply | Qty: 40 | Fill #0

## 2019-08-14 NOTE — Progress Notes (Signed)
Physical Therapy Treatment Patient Details Name: Troy Banks MRN: [id number] DOB: Feb 18, 1941 Today's Date: 08/14/2019    History of Present Illness Pt is a 79 y/o male s/p L THA, direct anterior. PMH includes HTN, DM, CAD s/p CABG and dCHF.     PT Comments    Pt making steady progress with functional mobility and tolerated ambulating into hallway with use of RW and min guard for safety. Plan is for pt to d/c home today. Will need stair training prior to discharge.    Follow Up Recommendations  Outpatient PT     Equipment Recommendations  None recommended by PT    Recommendations for Other Services       Precautions / Restrictions Precautions Precautions: None Restrictions Weight Bearing Restrictions: Yes LLE Weight Bearing: Weight bearing as tolerated    Mobility  Bed Mobility Overal bed mobility: Needs Assistance Bed Mobility: Supine to Sit     Supine to sit: Min assist     General bed mobility comments: min A for movement of L LE off of bed  Transfers Overall transfer level: Needs assistance Equipment used: Rolling walker (2 wheeled) Transfers: Sit to/from Stand Sit to Stand: Min guard         General transfer comment: cueing for safe hand placement, min guard for safety with transition  Ambulation/Gait Ambulation/Gait assistance: Min guard Gait Distance (Feet): 100 Feet Assistive device: Rolling walker (2 wheeled) Gait Pattern/deviations: Step-to pattern;Step-through pattern;Decreased step length - right;Decreased stance time - left;Decreased stride length;Decreased weight shift to left Gait velocity: Decreased   General Gait Details: initially with step-to then progressing to step-through gait pattern bilaterally; no instability or LOB, min guard for safety   Stairs             Wheelchair Mobility    Modified Rankin (Stroke Patients Only)       Balance Overall balance assessment: Needs assistance Sitting-balance support: No  upper extremity supported;Feet supported Sitting balance-Leahy Scale: Fair     Standing balance support: Bilateral upper extremity supported;During functional activity Standing balance-Leahy Scale: Poor Standing balance comment: Reliant on BUE support                             Cognition Arousal/Alertness: Awake/alert Behavior During Therapy: WFL for tasks assessed/performed Overall Cognitive Status: Within Functional Limits for tasks assessed                                        Exercises Total Joint Exercises Marching in Standing: Standing;AROM;Strengthening;Both;10 reps Standing Hip Extension: Standing;AROM;Strengthening;Left;10 reps General Exercises - Lower Extremity Mini-Sqauts: AROM;Strengthening;10 reps    General Comments        Pertinent Vitals/Pain Pain Assessment: Faces Faces Pain Scale: Hurts little more Pain Location: L hip  Pain Descriptors / Indicators: Aching;Operative site guarding Pain Intervention(s): Monitored during session;Repositioned    Home Living Family/patient expects to be discharged to:: Private residence Living Arrangements: Spouse/significant other                  Prior Function            PT Goals (current goals can now be found in the care plan section) Acute Rehab PT Goals PT Goal Formulation: With patient Time For Goal Achievement: 08/27/19 Potential to Achieve Goals: Good Progress towards PT goals: Progressing toward goals    Frequency  7X/week      PT Plan Current plan remains appropriate    Co-evaluation              AM-PAC PT "6 Clicks" Mobility   Outcome Measure  Help needed turning from your back to your side while in a flat bed without using bedrails?: A Little Help needed moving from lying on your back to sitting on the side of a flat bed without using bedrails?: A Little Help needed moving to and from a bed to a chair (including a wheelchair)?: None Help needed  standing up from a chair using your arms (e.g., wheelchair or bedside chair)?: None Help needed to walk in hospital room?: None Help needed climbing 3-5 steps with a railing? : A Little 6 Click Score: 21    End of Session Equipment Utilized During Treatment: Gait belt Activity Tolerance: Patient tolerated treatment well Patient left: in chair;with call bell/phone within reach Nurse Communication: Mobility status PT Visit Diagnosis: Other abnormalities of gait and mobility (R26.89);Pain Pain - Right/Left: Left Pain - part of body: Hip     Time: [id number] PT Time Calculation (min) (ACUTE ONLY): 23 min  Charges:  $Gait Training: 8-22 mins $Therapeutic Exercise: 8-22 mins                     Anastasio Champion, DPT  Acute Rehabilitation Services Pager [phone] Office [place] 08/14/2019, 10:56 AM

## 2019-08-14 NOTE — Discharge Instructions (Signed)

## 2019-08-14 NOTE — Progress Notes (Signed)
Subjective: 1 Day Post-Op Procedure(s) (LRB): LEFT TOTAL HIP ARTHROPLASTY ANTERIOR APPROACH (Left) Patient reports pain as moderate.  No complaints otherwise ,wanting to go home later today.   Objective: Vital signs in last 24 hours: Temp:  [97.5 F (36.4 C)-99.3 F (37.4 C)] 99.3 F (37.4 C) (03/10 0721) Pulse Rate:  [49-66] 66 (03/10 0721) Resp:  [15-19] 17 (03/10 0721) BP: (81-190)/(54-84) 145/67 (03/10 0721) SpO2:  [93 %-100 %] 93 % (03/10 0721) Weight:  [99.8 kg] 99.8 kg (03/09 1011)  Intake/Output from previous day: 03/09 0701 - 03/10 0700 In: 1380 [P.O.:830; I.V.:500] Out: 1450 [Urine:1200; Blood:250] Intake/Output this shift: No intake/output data recorded.  Recent Labs    08/14/19 0423  HGB 13.3   Recent Labs    08/14/19 0423  WBC 8.2  RBC 4.29  HCT 39.2  PLT 141*   Recent Labs    08/14/19 0423  NA 136  K 3.6  CL 103  CO2 23  BUN 19  CREATININE 0.97  GLUCOSE 129*  CALCIUM 8.7*   No results for input(s): LABPT, INR in the last 72 hours.  Left lower extremity: Sensation intact distally Dorsiflexion/Plantar flexion intact Incision: dressing C/D/I Compartment soft   Assessment/Plan: 1 Day Post-Op Procedure(s) (LRB): LEFT TOTAL HIP ARTHROPLASTY ANTERIOR APPROACH (Left) Up with therapy  Dispo home when ready from PT standpoint.      Troy Banks 08/14/2019, 8:07 AM

## 2019-08-14 NOTE — TOC Initial Note (Signed)
Transition of Care [facility]) - Initial/Assessment Note    Patient Details  Name: JEFFRI CANELA MRN: [id number] Date of Birth: 03-28-1941  Transition of Care [facility]) CM/SW Contact:    Curlene Labrum, RN Phone Number: 08/14/2019, 2:16 PM  Clinical Narrative:           Pt is a 79 y/o male s/p L THA, direct anterior. PMH includes HTN, DM, CAD s/p CABG and dCHF. Patient lives at home with his wife, who is a retired Marine scientist from [employer].  The patient states he has all needed DME at his home from his previous hip surgery from years ago.  Patient states he has had a previous posterior hip surgery and is aware of all surgical hip precautions.    Patient was set up prior to surgery with [facility] and will not need referral made by myself.  [name] is his primary care physician and he has any needed prescriptions filled at [facility] on [address]  No other barriers to discharge noted and patient should be discharge home tomorrow.   Expected Discharge Plan: [place] Barriers to Discharge: No Barriers Identified   Patient Goals and CMS Choice Patient states their goals for this hospitalization and ongoing recovery are:: This is my second hip and I am anxious to get home and feel better. CMS Medicare.gov Compare Post Acute Care list provided to:: (Medicare Choice provided by physician's office prior to surgery.)    Expected Discharge Plan and Services Expected Discharge Plan: [place]   Discharge Planning Services: CM Consult   Living arrangements for the past 2 months: Single Family Home Expected Discharge Date: 08/14/19               DME Arranged: (Patient currently has all necessary DME from previous surgery.)         HH Arranged: PT HH Agency: [facility] (formerly [employer]) Date [place]: (contacted by 65 office prior to surgery)      Prior Living  Arrangements/Services Living arrangements for the past 2 months: [place] with:: Spouse Patient language and need for interpreter reviewed:: Yes Do you feel safe going back to the place where you live?: Yes      Need for Family Participation in Patient Care: Yes (Comment) Care giver support system in place?: Yes (comment) Current home services: DME Criminal Activity/Legal Involvement Pertinent to Current Situation/Hospitalization: No - Comment as needed  Activities of Daily Living Home Assistive Devices/Equipment: Eyeglasses ADL Screening (condition at time of admission) Patient's cognitive ability adequate to safely complete daily activities?: Yes Is the patient deaf or have difficulty hearing?: No Does the patient have difficulty seeing, even when wearing glasses/contacts?: No Does the patient have difficulty concentrating, remembering, or making decisions?: No Patient able to express need for assistance with ADLs?: Yes Does the patient have difficulty dressing or bathing?: No Independently performs ADLs?: Yes (appropriate for developmental age) Does the patient have difficulty walking or climbing stairs?: Yes Weakness of Legs: Left Weakness of Arms/Hands: None  Permission Sought/Granted Permission sought to share information with : Case Manager Permission granted to share information with : Yes, Verbal Permission Granted     Permission granted to share info w AGENCY: [facility]        Emotional Assessment Appearance:: Appears stated age Attitude/Demeanor/Rapport: Gracious Affect (typically observed): Accepting Orientation: : Oriented to Self, Oriented to Place, Oriented to  Time,  Oriented to Situation Alcohol / Substance Use: Not Applicable Psych Involvement: No (comment)  Admission diagnosis:  Status post total replacement of left hip [Z96.642] Patient Active Problem List   Diagnosis Date Noted  . Unilateral primary osteoarthritis, left hip  08/13/2019  . Status post total replacement of left hip 08/13/2019  . Abdominal wall hernia 11/02/2018  . Progressive angina ([place]) 10/25/2018  . DOE (dyspnea on exertion) 10/25/2018  . Abscess of right buttock 11/27/2017  . Acute sinus infection 11/05/2017  . Pilon fracture 03/17/2017  . Pilon fracture of left tibia 03/17/2017  . At risk for adverse drug reaction 02/28/2017  . Closed fracture dislocation of ankle joint, left, with delayed healing, subsequent encounter 02/22/2017  . Renal stone 10/26/2016  . Gross hematuria 10/26/2016  . Bladder neck obstruction 10/26/2016  . External hemorrhoid, thrombosed 06/10/2016  . Internal hemorrhoids 06/10/2016  . Lumbar back pain 05/19/2016  . Osteoarthritis of right hip 10/27/2015  . Encounter for well adult exam with abnormal findings 10/27/2015  . Chronic chest pain 09/22/2015  . Acute gouty arthritis 06/11/2015  . Coronary artery disease involving native coronary artery with angina pectoris ([place]) 06/11/2015  . Chronic diastolic CHF (congestive heart failure) ([place]) 06/11/2015  . H/O tissue AVR Oct 2016 06/10/2015  . PAF post CABG/AVR- Amiodarone 06/10/2015  . Dyslipidemia 06/10/2015  . S/P CABG x 4-Oct 2016 03/30/2015  . H/O NSTEMI-Oct 2016 03/27/2015  . 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 Depression 09/08/2014  . GERD (gastroesophageal reflux disease)   . Essential tremor 03/31/2014  . Iliotibial band syndrome affecting left lower leg 01/01/2014  . Spondylolisthesis at L4-L5 level 01/01/2014  . Sinus bradycardia 12/31/2013  . Greater trochanteric bursitis of right hip 12/04/2013  . OSA on CPAP   . Diet-controlled diabetes mellitus ([place]) 07/08/2009  . COLONIC POLYPS, HX OF 12/31/2008  . GOUT 12/22/2008  . Essential hypertension 12/22/2008  . Diverticulosis of colon 12/22/2008  . NEPHROLITHIASIS, HX OF 12/22/2008   PCP:  [name], MD Pharmacy:   [place], [place] - [address] [place] [address] Phone: [phone] Fax: [phone]  [place], [place] - [address] [place] [address] Phone: [phone] Fax: [phone]     Social Determinants of Health ([place]) Interventions    Readmission Risk Interventions No flowsheet data found.

## 2019-08-14 NOTE — Progress Notes (Signed)
Physical Therapy Treatment Patient Details Name: Troy Banks MRN: [id number] DOB: 1940/10/03 Today's Date: 08/14/2019    History of Present Illness Pt is a 79 y/o male s/p L THA, direct anterior. PMH includes HTN, DM, CAD s/p CABG and dCHF.     PT Comments    Pt making steady progress with functional mobility and tolerated stair training this session without difficulties. Plan is for pt to d/c home tomorrow (per spouse). He is ready to d/c from PT perspective.   Pt would continue to benefit from skilled physical therapy services at this time while admitted and after d/c to address the below listed limitations in order to improve overall safety and independence with functional mobility.    Follow Up Recommendations  Outpatient PT;Other (comment)(spouse reported that pt has HHPT set up)     Equipment Recommendations  None recommended by PT    Recommendations for Other Services       Precautions / Restrictions Precautions Precautions: None Restrictions Weight Bearing Restrictions: Yes LLE Weight Bearing: Weight bearing as tolerated    Mobility  Bed Mobility Overal bed mobility: Needs Assistance Bed Mobility: Supine to Sit     Supine to sit: Min assist     General bed mobility comments: min A for movement of L LE off of bed  Transfers Overall transfer level: Needs assistance Equipment used: Rolling walker (2 wheeled) Transfers: Sit to/from Stand Sit to Stand: Min guard         General transfer comment: cueing for safe hand placement, min guard for safety with transition  Ambulation/Gait Ambulation/Gait assistance: Min guard Gait Distance (Feet): 50 Feet Assistive device: Rolling walker (2 wheeled) Gait Pattern/deviations: Step-to pattern;Step-through pattern;Decreased step length - right;Decreased stance time - left;Decreased stride length;Decreased weight shift to left Gait velocity: Decreased   General Gait Details: initially with step-to then  progressing to step-through gait pattern bilaterally; no instability or LOB, min guard for safety   Stairs Stairs: Yes Stairs assistance: Min guard Stair Management: Two rails;Step to pattern;Forwards Number of Stairs: 2 General stair comments: PT provided demonstration and cueing for sequencing and technique, min guard for safety; no difficulties noted   Wheelchair Mobility    Modified Rankin (Stroke Patients Only)       Balance Overall balance assessment: Needs assistance Sitting-balance support: No upper extremity supported;Feet supported Sitting balance-Leahy Scale: Fair     Standing balance support: Bilateral upper extremity supported;During functional activity Standing balance-Leahy Scale: Poor                              Cognition Arousal/Alertness: Awake/alert Behavior During Therapy: WFL for tasks assessed/performed Overall Cognitive Status: Within Functional Limits for tasks assessed                                        Exercises      General Comments        Pertinent Vitals/Pain Pain Assessment: 0-10 Pain Score: 5  Pain Location: L hip  Pain Descriptors / Indicators: Aching;Operative site guarding Pain Intervention(s): Monitored during session;Repositioned;Premedicated before session    Home Living                      Prior Function            PT Goals (current goals can now be found in  the care plan section) Acute Rehab PT Goals PT Goal Formulation: With patient Time For Goal Achievement: 08/27/19 Potential to Achieve Goals: Good Progress towards PT goals: Progressing toward goals    Frequency    7X/week      PT Plan Current plan remains appropriate    Co-evaluation              AM-PAC PT "6 Clicks" Mobility   Outcome Measure  Help needed turning from your back to your side while in a flat bed without using bedrails?: A Little Help needed moving from lying on your back to sitting  on the side of a flat bed without using bedrails?: A Little Help needed moving to and from a bed to a chair (including a wheelchair)?: None Help needed standing up from a chair using your arms (e.g., wheelchair or bedside chair)?: None Help needed to walk in hospital room?: None Help needed climbing 3-5 steps with a railing? : A Little 6 Click Score: 21    End of Session Equipment Utilized During Treatment: Gait belt Activity Tolerance: Patient tolerated treatment well Patient left: in chair;with call bell/phone within reach;with family/visitor present Nurse Communication: Mobility status PT Visit Diagnosis: Other abnormalities of gait and mobility (R26.89);Pain Pain - Right/Left: Left Pain - part of body: Hip     Time: 1340-1411 PT Time Calculation (min) (ACUTE ONLY): 31 min  Charges:  $Gait Training: 23-37 mins                     Anastasio Champion, DPT  Acute Rehabilitation Services Pager [phone] Office [place] 08/14/2019, 3:29 PM

## 2019-08-14 NOTE — Discharge Summary (Signed)
Patient ID: Troy Banks MRN: [id number] DOB/AGE: 06/26/1940 79 y.o.  Admit date: 08/13/2019 Discharge date: 08/14/2019  Admission Diagnoses:  Principal Problem:   Unilateral primary osteoarthritis, left hip Active Problems:   Status post total replacement of left hip   Discharge Diagnoses:  Same  Past Medical History:  Diagnosis Date  . Allergy   . Anxiety    PTSD  . Aortic stenosis, moderate 03/27/2015   post AVR echo Echo 11/16: Mild LVH, EF 55-60%, normal wall motion, grade 1 diastolic dysfunction, bioprosthetic AVR okay (mean gradient 18 mmHg) with trivial AI, mild LAE, atrial septal aneurysm, small effusion   . Arthritis    "qwhere" (10/29/2015)  . Cancer ([place])    skin cancer  . Carotid artery stenosis    Carotid US 11/17: 1-39% bilateral ICA stenosis. F/u prn  . Cervical stenosis of spinal canal    fusion 2006  . CHF (congestive heart failure) ([place])    "secondry to OHS"  . Closed left pilon fracture   . COLONIC POLYPS, HX OF   . Complication of anesthesia    "takes a lot to put him under and has woken up during surgery" Difficult to wake up . PTSD; "does not metabolize RX well"that he gets "violent", per pt.   . Coronary artery disease   . DIVERTICULOSIS, COLON   . Dysrhythmia    2 bouts of afib post op and at home but corrected with medication.  . Familial tremor 03/19/2014  . Family history of adverse reaction to anesthesia    Father - hold to get asleep  . GERD (gastroesophageal reflux disease)    uses Zantac  . GOUT   . Headache   . Heart murmur    had aortic value replacement  . High cholesterol   . History of blood transfusion   . History of kidney stones    surgery  . HYPERTENSION    off ACEI 2010 because of hyperkalemia in setting of ARI  . Hypothyroidism    newly diaganosed 09/2015  . Myocardial infarction [facility])    October 20th 2016  . OSA on CPAP   . Pneumonia   . 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 PTSD (post-traumatic stress disorder)   . PTSD (post-traumatic stress disorder)   . Sleep apnea    On a CPAP    Surgeries: Procedure(s): LEFT TOTAL HIP ARTHROPLASTY ANTERIOR APPROACH on 08/13/2019   Consultants:   Discharged Condition: Improved  Hospital Course: Troy Banks is an 79 y.o. male who was admitted 08/13/2019 for operative treatment ofUnilateral primary osteoarthritis, left hip. Patient has severe unremitting pain that affects sleep, daily activities, and work/hobbies. After pre-op clearance the patient was taken to the operating room on 08/13/2019 and underwent  Procedure(s): LEFT TOTAL HIP ARTHROPLASTY ANTERIOR APPROACH.    Patient was given perioperative antibiotics:  Anti-infectives (From admission, onward)   Start     Dose/Rate Route Frequency Ordered Stop   08/13/19 1730  ceFAZolin (ANCEF) IVPB 1 g/50 mL premix     1 g 100 mL/hr over 30 Minutes Intravenous Every 6 hours 08/13/19 1526 08/14/19 0128   08/13/19 1007  ceFAZolin (ANCEF) 2-4 GM/100ML-% IVPB    Note to Pharmacy: Gregery Na   : cabinet override      08/13/19 1007 08/13/19 1205   08/13/19 1000  ceFAZolin (ANCEF) IVPB 2g/100 mL premix     2 g 200 mL/hr  over 30 Minutes Intravenous On call to O.R. 08/13/19 [id number] 08/13/19 1151       Patient was given sequential compression devices, early ambulation, and chemoprophylaxis to prevent DVT.  Patient benefited maximally from hospital stay and there were no complications.    Recent vital signs:  Patient Vitals for the past 24 hrs:  BP Temp Temp src Pulse Resp SpO2 Height Weight  08/14/19 0721 (!) 145/67 99.3 F (37.4 C) Oral 66 17 93 % -- --  08/14/19 0452 (!) 142/63 99.2 F (37.3 C) Oral 63 18 94 % -- --  08/13/19 2358 (!) 122/54 (!) 97.5 F (36.4 C) Oral 63 17 95 % -- --  08/13/19 2013 130/65 97.7 F (36.5 C) Oral (!) 54 18 94 % -- --  08/13/19 1529 (!) 165/83 97.8 F (36.6 C) Oral (!) 56 18 100 % -- --   08/13/19 1450 -- -- -- (!) 53 -- -- -- --  08/13/19 1420 -- -- -- -- -- 98 % -- --  08/13/19 1400 132/74 -- -- (!) 49 15 97 % -- --  08/13/19 1349 -- -- -- (!) 51 -- 99 % -- --  08/13/19 1315 (!) 81/59 98 F (36.7 C) -- (!) 51 19 96 % -- --  08/13/19 1011 (!) 190/84 98.8 F (37.1 C) -- 64 18 97 % 5\' 8"  (1.727 m) 99.8 kg     Recent laboratory studies:  Recent Labs    08/14/19 0423  WBC 8.2  HGB 13.3  HCT 39.2  PLT 141*  NA 136  K 3.6  CL 103  CO2 23  BUN 19  CREATININE 0.97  GLUCOSE 129*  CALCIUM 8.7*     Discharge Medications:   Allergies as of 08/14/2019      Reactions   Acyclovir And Related Other (See Comments)   Accumulates and causes stroke-like side-effects due to cytochrome P450 enzyme deficiency   Benzodiazepines Other (See Comments)   Patient states he gets stroke symptoms with benzo's.    Compazine [prochlorperazine Maleate] Other (See Comments)   Accumulates and causes stroke-like side-effects due to cytochrome P450 enzyme deficiency   Coreg [carvedilol] Other (See Comments)   Accumulates and causes stroke-like side-effects due to cytochrome P450 enzyme deficiency Patient poor metabolizer of CYP2D6 - Coreg undergoes extensive hepatic (including 2D6)   Flomax [tamsulosin Hcl] Other (See Comments)   Accumulates and causes stroke-like side-effects due to cytochrome P450 enzyme deficiency   Lisinopril Other (See Comments)   Hyperkalemia. 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   Mobic [meloxicam] Other (See Comments)   Accumulates and causes stroke-like side-effects due to cytochrome P450 enzyme deficiency   Ondansetron Other (See Comments)   Accumulates and causes stroke-like side-effects due to cytochrome P450 enzyme deficiency   Promethazine Other (See Comments)   Accumulates and causes stroke-like side-effects due to cytochrome P450 enzyme deficiency   Sertraline Hcl Other (See Comments)   Accumulates  and causes stroke-like side-effects due to cytochrome P450 enzyme deficiency   Tramadol Other (See Comments)   Accumulates and causes stroke-like side-effects due to cytochrome P450 enzyme deficiency   Atorvastatin Other (See Comments)   MYALGIAS   Colchicine Other (See Comments)   ataxia      Medication List    STOP taking these medications   ibuprofen 200 MG tablet Commonly known as: ADVIL     TAKE these medications   allopurinol 300 MG tablet Commonly known as: ZYLOPRIM TAKE 1 TABLET BY MOUTH  EVERY DAY   amLODipine 10 MG tablet Commonly known as: NORVASC TAKE 1 TABLET BY MOUTH EVERY DAY What changed: when to take this   aspirin 81 MG chewable tablet Chew 1 tablet (81 mg total) by mouth 2 (two) times daily. What changed: when to take this   calcium carbonate 750 MG chewable tablet Commonly known as: TUMS EX Chew 1 tablet by mouth daily as needed for heartburn.   famotidine 40 MG tablet Commonly known as: Pepcid Take 1 tablet (40 mg total) by mouth daily.   furosemide 40 MG tablet Commonly known as: LASIX TAKE 1 TABLET BY MOUTH EVERY OTHER DAY   levothyroxine 25 MCG tablet Commonly known as: SYNTHROID Take 1 tablet (25 mcg total) by mouth daily before breakfast. Annual appt due in May must see provider for future refills What changed: additional instructions   LUBRICANT EYE DROPS OP Place 1 drop into both eyes 2 (two) times daily as needed (dry/irritated eyes.).   nitroGLYCERIN 0.4 MG SL tablet Commonly known as: NITROSTAT PLACE 1 TABLET UNDER THE TONGUE EVERY 5 MINUTES AS NEEDED FOR CHEST PAIN. What changed: See the new instructions.   oxyCODONE 5 MG immediate release tablet Commonly known as: Oxy IR/ROXICODONE Take 1-2 tablets (5-10 mg total) by mouth every 4 (four) hours as needed for moderate pain (pain score 4-6).   pantoprazole 40 MG tablet Commonly known as: PROTONIX Take 1 tablet (40 mg total) by mouth daily.   primidone 50 MG tablet Commonly  known as: MYSOLINE Take 4 tablets (200 mg total) by mouth in the morning and at bedtime.   rosuvastatin 10 MG tablet Commonly known as: CRESTOR TAKE 1/2 TABLET BY MOUTH EVERY DAY   tiZANidine 4 MG tablet Commonly known as: ZANAFLEX Take 1 tablet (4 mg total) by mouth every 8 (eight) hours as needed for muscle spasms.   Turmeric 500 MG Caps Take 500 mg by mouth 2 (two) times daily.   vitamin B-12 1000 MCG tablet Commonly known as: CYANOCOBALAMIN Take 1,000 mcg by mouth 2 (two) times daily.   vitamin C 1000 MG tablet Take 1,000 mg by mouth 2 (two) times daily.   Vitamin D-3 125 MCG (5000 UT) Tabs Take 5,000 Units by mouth 2 (two) times a day.            Durable Medical Equipment  (From admission, onward)         Start     Ordered   08/13/19 1527  DME 3 n 1  Once     08/13/19 1526   08/13/19 1527  DME Walker rolling  Once    Question Answer Comment  Walker: With 5 Inch Wheels   Patient needs a walker to treat with the following condition Status post total replacement of left hip      08/13/19 1526          Diagnostic Studies: DG Pelvis Portable  Result Date: 08/13/2019 CLINICAL DATA:  Status post left hip replacement. EXAM: PORTABLE PELVIS 1-2 VIEWS COMPARISON:  None. FINDINGS: Patient is status post left hip replacement. Hardware is in good position on a single frontal view. Skin staples and postoperative air are so shaded with the soft tissues. Remote right hip replacement noted. IMPRESSION: Left hip replacement as above. Electronically Signed   By: Dorise Bullion III M.D   On: 08/13/2019 14:06   DG C-Arm 1-60 Min  Result Date: 08/13/2019 CLINICAL DATA:  Elective surgery. EXAM: DG C-ARM 1-60 MIN; OPERATIVE LEFT HIP WITH PELVIS  FLUOROSCOPY TIME:  Fluoroscopy Time:  16 seconds. Radiation Exposure Index (if provided by the fluoroscopic device): 2.1375 mGy Number of Acquired Spot Images: 2 COMPARISON:  Preoperative pelvic radiograph 05/01/2019 FINDINGS: Two  fluoroscopic spot views provided from the operating room. Left hip arthroplasty in place. Prior right hip arthroplasty is partially included. IMPRESSION: Intraoperative fluoroscopy after left hip arthroplasty. Electronically Signed   By: Keith Rake M.D.   On: 08/13/2019 13:29   DG HIP OPERATIVE UNILAT W OR W/O PELVIS LEFT  Result Date: 08/13/2019 CLINICAL DATA:  Elective surgery. EXAM: DG C-ARM 1-60 MIN; OPERATIVE LEFT HIP WITH PELVIS FLUOROSCOPY TIME:  Fluoroscopy Time:  16 seconds. Radiation Exposure Index (if provided by the fluoroscopic device): 2.1375 mGy Number of Acquired Spot Images: 2 COMPARISON:  Preoperative pelvic radiograph 05/01/2019 FINDINGS: Two fluoroscopic spot views provided from the operating room. Left hip arthroplasty in place. Prior right hip arthroplasty is partially included. IMPRESSION: Intraoperative fluoroscopy after left hip arthroplasty. Electronically Signed   By: Keith Rake M.D.   On: 08/13/2019 13:29    Disposition: Discharge disposition: 01-Home or Self Care         Follow-up Information    Mcarthur Rossetti, MD. Go on 08/27/2019.   Specialty: Orthopedic Surgery Why: at 10:00 am for first post-op appointment with Dr. Ninfa Linden. Contact information: [place] [address] [phone]        [facility] Follow up.   Specialty: Home Health Services Why: Someone from the home health agency will be in touch with you after discharge to arrange home health physical therapy initial visit. Contact information: [address] [place] [address] [phone]            Signed: Erskine Emery 08/14/2019, 8:15 AM

## 2019-08-14 NOTE — Progress Notes (Signed)
Patient ID: Troy Banks, male   DOB: Oct 31, 1940, 79 y.o.   MRN: [id number] We will need to not discharge the patient today.  His wife is concerned that he is a fall risk and is not steady on his feet.  Still needing some IV pain meds.  Will change to inpatient admission.  PT to still work with the patient later today.  Hopefully can discharge to home tomorrow.

## 2019-08-14 NOTE — Plan of Care (Signed)
  Problem: Pain Management: Goal: Pain level will decrease Outcome: Progressing   Problem: Skin Integrity: Goal: Signs of wound healing will improve Outcome: Progressing   Problem: Tissue Perfusion: Goal: Ability to maintain adequate tissue perfusion will improve Outcome: Progressing

## 2019-08-15 NOTE — Discharge Summary (Signed)
Patient ID: Troy Banks MRN: [id number] DOB/AGE: 1941/01/16 79 y.o.  Admit date: 08/13/2019 Discharge date: 08/15/2019  Admission Diagnoses:  Principal Problem:   Unilateral primary osteoarthritis, left hip Active Problems:   Status post total replacement of left hip   Discharge Diagnoses:  Same  Past Medical History:  Diagnosis Date  . Allergy   . Anxiety    PTSD  . Aortic stenosis, moderate 03/27/2015   post AVR echo Echo 11/16: Mild LVH, EF 55-60%, normal wall motion, grade 1 diastolic dysfunction, bioprosthetic AVR okay (mean gradient 18 mmHg) with trivial AI, mild LAE, atrial septal aneurysm, small effusion   . Arthritis    "qwhere" (10/29/2015)  . Cancer ([place])    skin cancer  . Carotid artery stenosis    Carotid US 11/17: 1-39% bilateral ICA stenosis. F/u prn  . Cervical stenosis of spinal canal    fusion 2006  . CHF (congestive heart failure) ([place])    "secondry to OHS"  . Closed left pilon fracture   . COLONIC POLYPS, HX OF   . Complication of anesthesia    "takes a lot to put him under and has woken up during surgery" Difficult to wake up . PTSD; "does not metabolize RX well"that he gets "violent", per pt.   . Coronary artery disease   . DIVERTICULOSIS, COLON   . Dysrhythmia    2 bouts of afib post op and at home but corrected with medication.  . Familial tremor 03/19/2014  . Family history of adverse reaction to anesthesia    Father - hold to get asleep  . GERD (gastroesophageal reflux disease)    uses Zantac  . GOUT   . Headache   . Heart murmur    had aortic value replacement  . High cholesterol   . History of blood transfusion   . History of kidney stones    surgery  . HYPERTENSION    off ACEI 2010 because of hyperkalemia in setting of ARI  . Hypothyroidism    newly diaganosed 09/2015  . Myocardial infarction [facility])    October 20th 2016  . OSA on CPAP   . Pneumonia   . 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 PTSD (post-traumatic stress disorder)   . PTSD (post-traumatic stress disorder)   . Sleep apnea    On a CPAP    Surgeries: Procedure(s): LEFT TOTAL HIP ARTHROPLASTY ANTERIOR APPROACH on 08/13/2019   Consultants:   Discharged Condition: Improved  Hospital Course: Troy Banks is an 79 y.o. male who was admitted 08/13/2019 for operative treatment ofUnilateral primary osteoarthritis, left hip. Patient has severe unremitting pain that affects sleep, daily activities, and work/hobbies. After pre-op clearance the patient was taken to the operating room on 08/13/2019 and underwent  Procedure(s): LEFT TOTAL HIP ARTHROPLASTY ANTERIOR APPROACH.    Patient was given perioperative antibiotics:  Anti-infectives (From admission, onward)   Start     Dose/Rate Route Frequency Ordered Stop   08/13/19 1730  ceFAZolin (ANCEF) IVPB 1 g/50 mL premix     1 g 100 mL/hr over 30 Minutes Intravenous Every 6 hours 08/13/19 1526 08/14/19 0128   08/13/19 1007  ceFAZolin (ANCEF) 2-4 GM/100ML-% IVPB    Note to Pharmacy: Gregery Na   : cabinet override      08/13/19 1007 08/13/19 1205   08/13/19 1000  ceFAZolin (ANCEF) IVPB 2g/100 mL premix     2 g 200 mL/hr  over 30 Minutes Intravenous On call to O.R. 08/13/19 [id number] 08/13/19 1151       Patient was given sequential compression devices, early ambulation, and chemoprophylaxis to prevent DVT.  Patient benefited maximally from hospital stay and there were no complications. Discharge held for additional physical therapy  So that patient could be discharged safely to home.   Recent vital signs:  Patient Vitals for the past 24 hrs:  BP Temp Temp src Pulse Resp SpO2  08/15/19 0757 (!) 167/54 99.1 F (37.3 C) Oral 75 17 96 %  08/15/19 0351 (!) 147/55 (!) 97.5 F (36.4 C) Oral (!) 59 18 97 %  08/14/19 2321 -- -- -- 70 16 94 %  08/14/19 2008 (!) 147/54 98.7 F (37.1 C) Oral 72 17 92 %  08/14/19 1524 125/63 98.4 F (36.9 C)  Oral 69 17 91 %     Recent laboratory studies:  Recent Labs    08/14/19 0423  WBC 8.2  HGB 13.3  HCT 39.2  PLT 141*  NA 136  K 3.6  CL 103  CO2 23  BUN 19  CREATININE 0.97  GLUCOSE 129*  CALCIUM 8.7*     Discharge Medications:   Allergies as of 08/15/2019      Reactions   Acyclovir And Related Other (See Comments)   Accumulates and causes stroke-like side-effects due to cytochrome P450 enzyme deficiency   Benzodiazepines Other (See Comments)   Patient states he gets stroke symptoms with benzo's.    Compazine [prochlorperazine Maleate] Other (See Comments)   Accumulates and causes stroke-like side-effects due to cytochrome P450 enzyme deficiency   Coreg [carvedilol] Other (See Comments)   Accumulates and causes stroke-like side-effects due to cytochrome P450 enzyme deficiency Patient poor metabolizer of CYP2D6 - Coreg undergoes extensive hepatic (including 2D6)   Flomax [tamsulosin Hcl] Other (See Comments)   Accumulates and causes stroke-like side-effects due to cytochrome P450 enzyme deficiency   Lisinopril Other (See Comments)   Hyperkalemia. 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   Mobic [meloxicam] Other (See Comments)   Accumulates and causes stroke-like side-effects due to cytochrome P450 enzyme deficiency   Ondansetron Other (See Comments)   Accumulates and causes stroke-like side-effects due to cytochrome P450 enzyme deficiency   Promethazine Other (See Comments)   Accumulates and causes stroke-like side-effects due to cytochrome P450 enzyme deficiency   Sertraline Hcl Other (See Comments)   Accumulates and causes stroke-like side-effects due to cytochrome P450 enzyme deficiency   Tramadol Other (See Comments)   Accumulates and causes stroke-like side-effects due to cytochrome P450 enzyme deficiency   Atorvastatin Other (See Comments)   MYALGIAS   Colchicine Other (See Comments)   ataxia      Medication  List    STOP taking these medications   ibuprofen 200 MG tablet Commonly known as: ADVIL     TAKE these medications   allopurinol 300 MG tablet Commonly known as: ZYLOPRIM TAKE 1 TABLET BY MOUTH EVERY DAY   amLODipine 10 MG tablet Commonly known as: NORVASC TAKE 1 TABLET BY MOUTH EVERY DAY What changed: when to take this   aspirin 81 MG chewable tablet Chew 1 tablet (81 mg total) by mouth 2 (two) times daily. What changed: when to take this   calcium carbonate 750 MG chewable tablet Commonly known as: TUMS EX Chew 1 tablet by mouth daily as needed for heartburn.   famotidine 40 MG tablet Commonly known as: Pepcid Take 1 tablet (40 mg total)  by mouth daily.   furosemide 40 MG tablet Commonly known as: LASIX TAKE 1 TABLET BY MOUTH EVERY OTHER DAY   HYDROmorphone 2 MG tablet Commonly known as: DILAUDID Take 1 tablet (2 mg total) by mouth every 4 (four) hours as needed for severe pain.   levothyroxine 25 MCG tablet Commonly known as: SYNTHROID Take 1 tablet (25 mcg total) by mouth daily before breakfast. Annual appt due in May must see provider for future refills What changed: additional instructions   LUBRICANT EYE DROPS OP Place 1 drop into both eyes 2 (two) times daily as needed (dry/irritated eyes.).   nitroGLYCERIN 0.4 MG SL tablet Commonly known as: NITROSTAT PLACE 1 TABLET UNDER THE TONGUE EVERY 5 MINUTES AS NEEDED FOR CHEST PAIN. What changed: See the new instructions.   pantoprazole 40 MG tablet Commonly known as: PROTONIX Take 1 tablet (40 mg total) by mouth daily.   primidone 50 MG tablet Commonly known as: MYSOLINE Take 4 tablets (200 mg total) by mouth in the morning and at bedtime.   rosuvastatin 10 MG tablet Commonly known as: CRESTOR TAKE 1/2 TABLET BY MOUTH EVERY DAY   tiZANidine 4 MG tablet Commonly known as: ZANAFLEX Take 1 tablet (4 mg total) by mouth every 8 (eight) hours as needed for muscle spasms. What changed: Another medication  with the same name was added. Make sure you understand how and when to take each.   tiZANidine 4 MG tablet Commonly known as: ZANAFLEX Take 1 tablet (4 mg total) by mouth every 8 (eight) hours as needed for muscle spasms. What changed: You were already taking a medication with the same name, and this prescription was added. Make sure you understand how and when to take each.   Turmeric 500 MG Caps Take 500 mg by mouth 2 (two) times daily.   vitamin B-12 1000 MCG tablet Commonly known as: CYANOCOBALAMIN Take 1,000 mcg by mouth 2 (two) times daily.   vitamin C 1000 MG tablet Take 1,000 mg by mouth 2 (two) times daily.   Vitamin D-3 125 MCG (5000 UT) Tabs Take 5,000 Units by mouth 2 (two) times a day.            Durable Medical Equipment  (From admission, onward)         Start     Ordered   08/13/19 1527  DME 3 n 1  Once     08/13/19 1526   08/13/19 1527  DME Walker rolling  Once    Question Answer Comment  Walker: With 5 Inch Wheels   Patient needs a walker to treat with the following condition Status post total replacement of left hip      08/13/19 1526          Diagnostic Studies: DG Pelvis Portable  Result Date: 08/13/2019 CLINICAL DATA:  Status post left hip replacement. EXAM: PORTABLE PELVIS 1-2 VIEWS COMPARISON:  None. FINDINGS: Patient is status post left hip replacement. Hardware is in good position on a single frontal view. Skin staples and postoperative air are so shaded with the soft tissues. Remote right hip replacement noted. IMPRESSION: Left hip replacement as above. Electronically Signed   By: Dorise Bullion III M.D   On: 08/13/2019 14:06   DG C-Arm 1-60 Min  Result Date: 08/13/2019 CLINICAL DATA:  Elective surgery. EXAM: DG C-ARM 1-60 MIN; OPERATIVE LEFT HIP WITH PELVIS FLUOROSCOPY TIME:  Fluoroscopy Time:  16 seconds. Radiation Exposure Index (if provided by the fluoroscopic device): 2.1375 mGy Number of  Acquired Spot Images: 2 COMPARISON:  Preoperative  pelvic radiograph 05/01/2019 FINDINGS: Two fluoroscopic spot views provided from the operating room. Left hip arthroplasty in place. Prior right hip arthroplasty is partially included. IMPRESSION: Intraoperative fluoroscopy after left hip arthroplasty. Electronically Signed   By: Keith Rake M.D.   On: 08/13/2019 13:29   DG HIP OPERATIVE UNILAT W OR W/O PELVIS LEFT  Result Date: 08/13/2019 CLINICAL DATA:  Elective surgery. EXAM: DG C-ARM 1-60 MIN; OPERATIVE LEFT HIP WITH PELVIS FLUOROSCOPY TIME:  Fluoroscopy Time:  16 seconds. Radiation Exposure Index (if provided by the fluoroscopic device): 2.1375 mGy Number of Acquired Spot Images: 2 COMPARISON:  Preoperative pelvic radiograph 05/01/2019 FINDINGS: Two fluoroscopic spot views provided from the operating room. Left hip arthroplasty in place. Prior right hip arthroplasty is partially included. IMPRESSION: Intraoperative fluoroscopy after left hip arthroplasty. Electronically Signed   By: Keith Rake M.D.   On: 08/13/2019 13:29    Disposition:     Follow-up Information    Mcarthur Rossetti, MD. Go on 08/27/2019.   Specialty: Orthopedic Surgery Why: at 10:00 am for first post-op appointment with Dr. Ninfa Linden. Contact information: [place] [address] [phone]        [facility] Follow up.   Specialty: Home Health Services Why: Someone from the home health agency will be in touch with you after discharge to arrange home health physical therapy initial visit. Contact information: [address] [place] [address] [phone]            Signed: Erskine Emery 08/15/2019, 12:09 PM

## 2019-08-15 NOTE — Progress Notes (Signed)
Discharge instructions addressed; Pt in stable condition;Pt.' s spouse present in the room and will be his ride home.

## 2019-08-15 NOTE — Progress Notes (Signed)
Physical Therapy Treatment Patient Details Name: Troy Banks MRN: [id number] DOB: [birth year] Today's Date: 08/15/2019    History of Present Illness Pt is a 79 y/o male s/p L THA, direct anterior. PMH includes HTN, DM, CAD s/p CABG and dCHF.     PT Comments    [name] continues to make progress with functional mobility as demonstrated by proper sequencing and safety with bed mobility, transfers, and stair navigation. He was able to ascend/descend 4 stairs w/ B handrails with a step-to pattern and min guard for safety. Pt had a minor urine accident but appeared to be due to urgency and urinal mispositioned. Plan is to d/c home today, with [facility] PT. He is ready to be d/c from PT perspective.  Pt would continue to benefit from skilled physical therapy services at this time while admitted and after d/c to address the below listed limitations in order to improve overall safety and independence with functional mobility.    Follow Up Recommendations  Outpatient PT;Other (comment)(pt has home health PT set up)     Equipment Recommendations  None recommended by PT    Recommendations for Other Services       Precautions / Restrictions Precautions Precautions: None Restrictions Weight Bearing Restrictions: Yes LLE Weight Bearing: Weight bearing as tolerated    Mobility  Bed Mobility Overal bed mobility: Needs Assistance Bed Mobility: Supine to Sit     Supine to sit: Min assist     General bed mobility comments: min A for movement of L LE off of bed  Transfers Overall transfer level: Needs assistance Equipment used: Rolling walker (2 wheeled) Transfers: Sit to/from Stand Sit to Stand: Min guard         General transfer comment: min guard for safety with transfer  Ambulation/Gait Ambulation/Gait assistance: Min guard Gait Distance (Feet): 5 Feet Assistive device: Rolling walker (2 wheeled) Gait Pattern/deviations: Step-to pattern;Decreased step length -  right;Decreased stance time - left;Decreased stride length;Decreased weight shift to left Gait velocity: Decreased       Stairs Stairs: Yes Stairs assistance: Min guard Stair Management: Two rails;Step to pattern;Forwards Number of Stairs: 4 General stair comments: PT provided cueing and teach-back for sequencing and technique, min guard for safety   Wheelchair Mobility    Modified Rankin (Stroke Patients Only)       Balance Overall balance assessment: Needs assistance Sitting-balance support: No upper extremity supported;Feet supported Sitting balance-Leahy Scale: Fair     Standing balance support: Bilateral upper extremity supported;During functional activity Standing balance-Leahy Scale: Fair Standing balance comment: Reliant on BUE support                             Cognition Arousal/Alertness: Awake/alert Behavior During Therapy: WFL for tasks assessed/performed Overall Cognitive Status: Within Functional Limits for tasks assessed                                        Exercises Total Joint Exercises Marching in Standing: Standing;AROM;Strengthening;Both;10 reps    General Comments        Pertinent Vitals/Pain Pain Assessment: 0-10 Pain Score: 7  Pain Location: L hip  Pain Descriptors / Indicators: Aching;Operative site guarding Pain Intervention(s): Monitored during session;Repositioned    Home Living  Prior Function            PT Goals (current goals can now be found in the care plan section) Acute Rehab PT Goals PT Goal Formulation: With patient Time For Goal Achievement: 08/27/19 Potential to Achieve Goals: Good Progress towards PT goals: Progressing toward goals    Frequency    7X/week      PT Plan Current plan remains appropriate    Co-evaluation              AM-PAC PT "6 Clicks" Mobility   Outcome Measure  Help needed turning from your back to your side while in  a flat bed without using bedrails?: A Little Help needed moving from lying on your back to sitting on the side of a flat bed without using bedrails?: A Little Help needed moving to and from a bed to a chair (including a wheelchair)?: None Help needed standing up from a chair using your arms (e.g., wheelchair or bedside chair)?: None Help needed to walk in hospital room?: None Help needed climbing 3-5 steps with a railing? : A Little 6 Click Score: 21    End of Session Equipment Utilized During Treatment: Gait belt Activity Tolerance: Patient tolerated treatment well Patient left: in chair;with call bell/phone within reach;with family/visitor present Nurse Communication: Mobility status PT Visit Diagnosis: Other abnormalities of gait and mobility (R26.89);Pain Pain - Right/Left: Left Pain - part of body: Hip     Time: [id number] PT Time Calculation (min) (ACUTE ONLY): 28 min  Charges:  $Gait Training: 8-22 mins $Therapeutic Activity: 8-22 mins                     [name], SPT Acute Rehab  [id number]   [name] 08/15/2019, 1:08 PM

## 2019-08-15 NOTE — Progress Notes (Signed)
Physical Therapy Treatment Patient Details Name: Troy Banks MRN: [id number] DOB: [birth year] Today's Date: 08/15/2019    History of Present Illness Pt is a 79 y/o male s/p L THA, direct anterior. PMH includes HTN, DM, CAD s/p CABG and dCHF.     PT Comments    Pt continues to make steady progress with functional mobility as demonstrated by proper sequencing and safety with bed mobility, transfers and ambulation with RW. He ambulated ~159ft with RW and min guard for safety. Pt tolerated the treatment well, reporting his pain to be 9/10 in L LE with no change throughout session. Plan is to d/c home today, with [facility] PT (set up prior). He is ready to d/c from PT perspective.  Pt would continue to benefit from skilled physical therapy services at this time while admitted and after d/c to address the below listed limitations in order to improve overall safety and independence with functional mobility.    Follow Up Recommendations  Outpatient PT;Other (comment)(HH PT previously set-up)     Equipment Recommendations  None recommended by PT    Recommendations for Other Services       Precautions / Restrictions Precautions Precautions: None Restrictions Weight Bearing Restrictions: Yes LLE Weight Bearing: Weight bearing as tolerated    Mobility  Bed Mobility Overal bed mobility: Needs Assistance Bed Mobility: Supine to Sit     Supine to sit: Min assist     General bed mobility comments: min A for movement of L LE off of bed  Transfers Overall transfer level: Needs assistance Equipment used: Rolling walker (2 wheeled) Transfers: Sit to/from Stand Sit to Stand: Min guard         General transfer comment: min guard for safety with transfer  Ambulation/Gait Ambulation/Gait assistance: Min guard Gait Distance (Feet): 100 Feet Assistive device: Rolling walker (2 wheeled) Gait Pattern/deviations: Step-to pattern;Decreased step length - right;Decreased stance time -  left;Decreased stride length;Decreased weight shift to left Gait velocity: Decreased   General Gait Details: slow gait velocity initially, progressed velocity throughout. No LOB or instability, min guard for safety   Stairs             Wheelchair Mobility    Modified Rankin (Stroke Patients Only)       Balance Overall balance assessment: Needs assistance Sitting-balance support: No upper extremity supported;Feet supported Sitting balance-Leahy Scale: Fair     Standing balance support: Bilateral upper extremity supported;During functional activity Standing balance-Leahy Scale: Fair Standing balance comment: Reliant on BUE support                             Cognition Arousal/Alertness: Awake/alert Behavior During Therapy: WFL for tasks assessed/performed Overall Cognitive Status: Within Functional Limits for tasks assessed                                        Exercises      General Comments        Pertinent Vitals/Pain Pain Assessment: 0-10 Pain Score: 9  Pain Location: L hip  Pain Descriptors / Indicators: Aching;Operative site guarding Pain Intervention(s): Monitored during session;Repositioned;Patient requesting pain meds-RN notified    Home Living                      Prior Function  PT Goals (current goals can now be found in the care plan section) Acute Rehab PT Goals PT Goal Formulation: With patient Time For Goal Achievement: 08/27/19 Potential to Achieve Goals: Good Progress towards PT goals: Progressing toward goals    Frequency    7X/week      PT Plan Current plan remains appropriate    Co-evaluation              AM-PAC PT "6 Clicks" Mobility   Outcome Measure  Help needed turning from your back to your side while in a flat bed without using bedrails?: A Little Help needed moving from lying on your back to sitting on the side of a flat bed without using bedrails?: A  Little Help needed moving to and from a bed to a chair (including a wheelchair)?: None Help needed standing up from a chair using your arms (e.g., wheelchair or bedside chair)?: None Help needed to walk in hospital room?: None Help needed climbing 3-5 steps with a railing? : A Little 6 Click Score: 21    End of Session Equipment Utilized During Treatment: Gait belt Activity Tolerance: Patient tolerated treatment well Patient left: in chair;with call bell/phone within reach Nurse Communication: Mobility status PT Visit Diagnosis: Other abnormalities of gait and mobility (R26.89);Pain Pain - Right/Left: Left Pain - part of body: Hip     Time: 0811-0828 PT Time Calculation (min) (ACUTE ONLY): 17 min  Charges:  $Gait Training: 8-22 mins                     Kandra Graven, SPT Acute Rehab  [id number]    Troy Banks 08/15/2019, 10:14 AM

## 2019-08-15 NOTE — Progress Notes (Signed)
PT Progress Note for Charges    08/15/19 1000  PT Visit Information  Last PT Received On 08/15/19  PT General Charges  $$ ACUTE PT VISIT 1 Visit  PT Treatments  $Gait Training 8-22 mins  Anastasio Champion, DPT  Acute Rehabilitation Services Pager [phone] Office [phone]

## 2019-08-15 NOTE — Progress Notes (Signed)
Subjective: 2 Days Post-Op Procedure(s) (LRB): LEFT TOTAL HIP ARTHROPLASTY ANTERIOR APPROACH (Left) Patient reports pain as moderate.  No complaints. Did well with PT this AM.   Objective: Vital signs in last 24 hours: Temp:  [97.5 F (36.4 C)-99.1 F (37.3 C)] 99.1 F (37.3 C) (03/11 0757) Pulse Rate:  [59-75] 75 (03/11 0757) Resp:  [16-18] 17 (03/11 0757) BP: (125-167)/(54-63) 167/54 (03/11 0757) SpO2:  [91 %-97 %] 96 % (03/11 0757)  Intake/Output from previous day: 03/10 0701 - 03/11 0700 In: 2144.2 [P.O.:1520; I.V.:624.2] Out: 950 [Urine:950] Intake/Output this shift: Total I/O In: 360 [P.O.:360] Out: 500 [Urine:500]  Recent Labs    08/14/19 0423  HGB 13.3   Recent Labs    08/14/19 0423  WBC 8.2  RBC 4.29  HCT 39.2  PLT 141*   Recent Labs    08/14/19 0423  NA 136  K 3.6  CL 103  CO2 23  BUN 19  CREATININE 0.97  GLUCOSE 129*  CALCIUM 8.7*   No results for input(s): LABPT, INR in the last 72 hours.  Left lower extremity:  Dorsiflexion/Plantar flexion intact Incision: dressing C/D/I Compartment soft   Assessment/Plan: 2 Days Post-Op Procedure(s) (LRB): LEFT TOTAL HIP ARTHROPLASTY ANTERIOR APPROACH (Left) Discharge home with home health      Erskine Emery 08/15/2019, 12:07 PM

## 2019-08-15 NOTE — Progress Notes (Signed)
PT Progress Note for Charges    08/15/19 1300  PT Visit Information  Last PT Received On 08/15/19  PT General Charges  $$ ACUTE PT VISIT 1 Visit  PT Treatments  $Gait Training 8-22 mins  $Therapeutic Activity 8-22 mins  Anastasio Champion, DPT  Acute Rehabilitation Services Pager [phone] Office [phone]

## 2019-08-16 ENCOUNTER — Telehealth: Payer: Self-pay | Admitting: *Deleted

## 2019-08-16 DIAGNOSIS — Z85828 Personal history of other malignant neoplasm of skin: Secondary | ICD-10-CM | POA: Diagnosis not present

## 2019-08-16 DIAGNOSIS — E785 Hyperlipidemia, unspecified: Secondary | ICD-10-CM | POA: Diagnosis not present

## 2019-08-16 DIAGNOSIS — M109 Gout, unspecified: Secondary | ICD-10-CM | POA: Diagnosis not present

## 2019-08-16 DIAGNOSIS — I110 Hypertensive heart disease with heart failure: Secondary | ICD-10-CM | POA: Diagnosis not present

## 2019-08-16 DIAGNOSIS — M4316 Spondylolisthesis, lumbar region: Secondary | ICD-10-CM | POA: Diagnosis not present

## 2019-08-16 DIAGNOSIS — Z87891 Personal history of nicotine dependence: Secondary | ICD-10-CM | POA: Diagnosis not present

## 2019-08-16 DIAGNOSIS — Z981 Arthrodesis status: Secondary | ICD-10-CM | POA: Diagnosis not present

## 2019-08-16 DIAGNOSIS — K219 Gastro-esophageal reflux disease without esophagitis: Secondary | ICD-10-CM | POA: Diagnosis not present

## 2019-08-16 DIAGNOSIS — I350 Nonrheumatic aortic (valve) stenosis: Secondary | ICD-10-CM | POA: Diagnosis not present

## 2019-08-16 DIAGNOSIS — I5032 Chronic diastolic (congestive) heart failure: Secondary | ICD-10-CM | POA: Diagnosis not present

## 2019-08-16 DIAGNOSIS — E119 Type 2 diabetes mellitus without complications: Secondary | ICD-10-CM | POA: Diagnosis not present

## 2019-08-16 DIAGNOSIS — Z96643 Presence of artificial hip joint, bilateral: Secondary | ICD-10-CM | POA: Diagnosis not present

## 2019-08-16 DIAGNOSIS — G250 Essential tremor: Secondary | ICD-10-CM | POA: Diagnosis not present

## 2019-08-16 DIAGNOSIS — M4802 Spinal stenosis, cervical region: Secondary | ICD-10-CM | POA: Diagnosis not present

## 2019-08-16 DIAGNOSIS — I252 Old myocardial infarction: Secondary | ICD-10-CM | POA: Diagnosis not present

## 2019-08-16 DIAGNOSIS — G4733 Obstructive sleep apnea (adult) (pediatric): Secondary | ICD-10-CM | POA: Diagnosis not present

## 2019-08-16 DIAGNOSIS — E039 Hypothyroidism, unspecified: Secondary | ICD-10-CM | POA: Diagnosis not present

## 2019-08-16 DIAGNOSIS — I2510 Atherosclerotic heart disease of native coronary artery without angina pectoris: Secondary | ICD-10-CM | POA: Diagnosis not present

## 2019-08-16 DIAGNOSIS — Z471 Aftercare following joint replacement surgery: Secondary | ICD-10-CM | POA: Diagnosis not present

## 2019-08-16 DIAGNOSIS — I480 Paroxysmal atrial fibrillation: Secondary | ICD-10-CM | POA: Diagnosis not present

## 2019-08-16 DIAGNOSIS — I6529 Occlusion and stenosis of unspecified carotid artery: Secondary | ICD-10-CM | POA: Diagnosis not present

## 2019-08-16 DIAGNOSIS — Z86010 Personal history of colonic polyps: Secondary | ICD-10-CM | POA: Diagnosis not present

## 2019-08-16 DIAGNOSIS — Z955 Presence of coronary angioplasty implant and graft: Secondary | ICD-10-CM | POA: Diagnosis not present

## 2019-08-16 NOTE — Telephone Encounter (Signed)
Ortho bundle discharge call completed. RNCM call to patient and spoke with his wife. She states that his left knee is more swollen and she feels this is a flare up of his gout that he typically gets after any type of anesthesia. She has stopped his Allopurinol and started his Colchicine for this. HHPT in home today working with patient during call. No other issues related to hip. She will let us know if the gout symptoms worsen or they need anything else for this.

## 2019-08-16 NOTE — Telephone Encounter (Signed)
Pt was on TCM report admitted 08/13/19 for Unilateral primary osteoarthritis, left hip. Pt underwent a total replacement of left hip. Pt tolerated procedure well, and D/C 08/15/19, and will follow-up w/Blackman, Lind Guest, MD. Go on 08/27/2019...[name]

## 2019-08-16 NOTE — Care Plan (Signed)
RNCM call to patient and spoke with his wife. She states that his left knee is more swollen and she feels this is a flare up of his gout that he typically gets after any type of anesthesia. She has stopped his Allopurinol and started his Colchicine for this. HHPT in home today working with patient during call. No other issues related to hip. She will let us know if the gout symptoms worsen or they need anything else for this.

## 2019-08-20 ENCOUNTER — Telehealth: Payer: Self-pay | Admitting: *Deleted

## 2019-08-20 DIAGNOSIS — M4316 Spondylolisthesis, lumbar region: Secondary | ICD-10-CM | POA: Diagnosis not present

## 2019-08-20 DIAGNOSIS — Z471 Aftercare following joint replacement surgery: Secondary | ICD-10-CM | POA: Diagnosis not present

## 2019-08-20 DIAGNOSIS — I6529 Occlusion and stenosis of unspecified carotid artery: Secondary | ICD-10-CM | POA: Diagnosis not present

## 2019-08-20 DIAGNOSIS — Z87891 Personal history of nicotine dependence: Secondary | ICD-10-CM | POA: Diagnosis not present

## 2019-08-20 DIAGNOSIS — E785 Hyperlipidemia, unspecified: Secondary | ICD-10-CM | POA: Diagnosis not present

## 2019-08-20 DIAGNOSIS — I480 Paroxysmal atrial fibrillation: Secondary | ICD-10-CM | POA: Diagnosis not present

## 2019-08-20 DIAGNOSIS — I350 Nonrheumatic aortic (valve) stenosis: Secondary | ICD-10-CM | POA: Diagnosis not present

## 2019-08-20 DIAGNOSIS — M4802 Spinal stenosis, cervical region: Secondary | ICD-10-CM | POA: Diagnosis not present

## 2019-08-20 DIAGNOSIS — G4733 Obstructive sleep apnea (adult) (pediatric): Secondary | ICD-10-CM | POA: Diagnosis not present

## 2019-08-20 DIAGNOSIS — K219 Gastro-esophageal reflux disease without esophagitis: Secondary | ICD-10-CM | POA: Diagnosis not present

## 2019-08-20 DIAGNOSIS — Z955 Presence of coronary angioplasty implant and graft: Secondary | ICD-10-CM | POA: Diagnosis not present

## 2019-08-20 DIAGNOSIS — Z86010 Personal history of colonic polyps: Secondary | ICD-10-CM | POA: Diagnosis not present

## 2019-08-20 DIAGNOSIS — M109 Gout, unspecified: Secondary | ICD-10-CM | POA: Diagnosis not present

## 2019-08-20 DIAGNOSIS — Z96643 Presence of artificial hip joint, bilateral: Secondary | ICD-10-CM | POA: Diagnosis not present

## 2019-08-20 DIAGNOSIS — E119 Type 2 diabetes mellitus without complications: Secondary | ICD-10-CM | POA: Diagnosis not present

## 2019-08-20 DIAGNOSIS — E039 Hypothyroidism, unspecified: Secondary | ICD-10-CM | POA: Diagnosis not present

## 2019-08-20 DIAGNOSIS — Z981 Arthrodesis status: Secondary | ICD-10-CM | POA: Diagnosis not present

## 2019-08-20 DIAGNOSIS — I110 Hypertensive heart disease with heart failure: Secondary | ICD-10-CM | POA: Diagnosis not present

## 2019-08-20 DIAGNOSIS — G250 Essential tremor: Secondary | ICD-10-CM | POA: Diagnosis not present

## 2019-08-20 DIAGNOSIS — Z85828 Personal history of other malignant neoplasm of skin: Secondary | ICD-10-CM | POA: Diagnosis not present

## 2019-08-20 DIAGNOSIS — I252 Old myocardial infarction: Secondary | ICD-10-CM | POA: Diagnosis not present

## 2019-08-20 DIAGNOSIS — I2510 Atherosclerotic heart disease of native coronary artery without angina pectoris: Secondary | ICD-10-CM | POA: Diagnosis not present

## 2019-08-20 DIAGNOSIS — I5032 Chronic diastolic (congestive) heart failure: Secondary | ICD-10-CM | POA: Diagnosis not present

## 2019-08-20 NOTE — Telephone Encounter (Signed)
Ortho bundle 7 day call completed. 

## 2019-08-21 ENCOUNTER — Other Ambulatory Visit: Payer: Self-pay | Admitting: Cardiology

## 2019-08-21 DIAGNOSIS — I6529 Occlusion and stenosis of unspecified carotid artery: Secondary | ICD-10-CM | POA: Diagnosis not present

## 2019-08-21 DIAGNOSIS — Z87891 Personal history of nicotine dependence: Secondary | ICD-10-CM | POA: Diagnosis not present

## 2019-08-21 DIAGNOSIS — I2510 Atherosclerotic heart disease of native coronary artery without angina pectoris: Secondary | ICD-10-CM | POA: Diagnosis not present

## 2019-08-21 DIAGNOSIS — Z96643 Presence of artificial hip joint, bilateral: Secondary | ICD-10-CM | POA: Diagnosis not present

## 2019-08-21 DIAGNOSIS — M4802 Spinal stenosis, cervical region: Secondary | ICD-10-CM | POA: Diagnosis not present

## 2019-08-21 DIAGNOSIS — M109 Gout, unspecified: Secondary | ICD-10-CM | POA: Diagnosis not present

## 2019-08-21 DIAGNOSIS — Z85828 Personal history of other malignant neoplasm of skin: Secondary | ICD-10-CM | POA: Diagnosis not present

## 2019-08-21 DIAGNOSIS — I350 Nonrheumatic aortic (valve) stenosis: Secondary | ICD-10-CM | POA: Diagnosis not present

## 2019-08-21 DIAGNOSIS — M4316 Spondylolisthesis, lumbar region: Secondary | ICD-10-CM | POA: Diagnosis not present

## 2019-08-21 DIAGNOSIS — Z471 Aftercare following joint replacement surgery: Secondary | ICD-10-CM | POA: Diagnosis not present

## 2019-08-21 DIAGNOSIS — I110 Hypertensive heart disease with heart failure: Secondary | ICD-10-CM | POA: Diagnosis not present

## 2019-08-21 DIAGNOSIS — I252 Old myocardial infarction: Secondary | ICD-10-CM | POA: Diagnosis not present

## 2019-08-21 DIAGNOSIS — G4733 Obstructive sleep apnea (adult) (pediatric): Secondary | ICD-10-CM | POA: Diagnosis not present

## 2019-08-21 DIAGNOSIS — Z981 Arthrodesis status: Secondary | ICD-10-CM | POA: Diagnosis not present

## 2019-08-21 DIAGNOSIS — K219 Gastro-esophageal reflux disease without esophagitis: Secondary | ICD-10-CM | POA: Diagnosis not present

## 2019-08-21 DIAGNOSIS — E039 Hypothyroidism, unspecified: Secondary | ICD-10-CM | POA: Diagnosis not present

## 2019-08-21 DIAGNOSIS — Z86010 Personal history of colonic polyps: Secondary | ICD-10-CM | POA: Diagnosis not present

## 2019-08-21 DIAGNOSIS — Z955 Presence of coronary angioplasty implant and graft: Secondary | ICD-10-CM | POA: Diagnosis not present

## 2019-08-21 DIAGNOSIS — E119 Type 2 diabetes mellitus without complications: Secondary | ICD-10-CM | POA: Diagnosis not present

## 2019-08-21 DIAGNOSIS — E785 Hyperlipidemia, unspecified: Secondary | ICD-10-CM | POA: Diagnosis not present

## 2019-08-21 DIAGNOSIS — G250 Essential tremor: Secondary | ICD-10-CM | POA: Diagnosis not present

## 2019-08-21 DIAGNOSIS — I480 Paroxysmal atrial fibrillation: Secondary | ICD-10-CM | POA: Diagnosis not present

## 2019-08-21 DIAGNOSIS — I5032 Chronic diastolic (congestive) heart failure: Secondary | ICD-10-CM | POA: Diagnosis not present

## 2019-08-22 ENCOUNTER — Encounter: Payer: Self-pay | Admitting: Internal Medicine

## 2019-08-23 DIAGNOSIS — I5032 Chronic diastolic (congestive) heart failure: Secondary | ICD-10-CM | POA: Diagnosis not present

## 2019-08-23 DIAGNOSIS — E119 Type 2 diabetes mellitus without complications: Secondary | ICD-10-CM | POA: Diagnosis not present

## 2019-08-23 DIAGNOSIS — Z96643 Presence of artificial hip joint, bilateral: Secondary | ICD-10-CM | POA: Diagnosis not present

## 2019-08-23 DIAGNOSIS — M4802 Spinal stenosis, cervical region: Secondary | ICD-10-CM | POA: Diagnosis not present

## 2019-08-23 DIAGNOSIS — M109 Gout, unspecified: Secondary | ICD-10-CM | POA: Diagnosis not present

## 2019-08-23 DIAGNOSIS — E039 Hypothyroidism, unspecified: Secondary | ICD-10-CM | POA: Diagnosis not present

## 2019-08-23 DIAGNOSIS — Z85828 Personal history of other malignant neoplasm of skin: Secondary | ICD-10-CM | POA: Diagnosis not present

## 2019-08-23 DIAGNOSIS — G4733 Obstructive sleep apnea (adult) (pediatric): Secondary | ICD-10-CM | POA: Diagnosis not present

## 2019-08-23 DIAGNOSIS — I252 Old myocardial infarction: Secondary | ICD-10-CM | POA: Diagnosis not present

## 2019-08-23 DIAGNOSIS — Z86010 Personal history of colonic polyps: Secondary | ICD-10-CM | POA: Diagnosis not present

## 2019-08-23 DIAGNOSIS — I2510 Atherosclerotic heart disease of native coronary artery without angina pectoris: Secondary | ICD-10-CM | POA: Diagnosis not present

## 2019-08-23 DIAGNOSIS — I6529 Occlusion and stenosis of unspecified carotid artery: Secondary | ICD-10-CM | POA: Diagnosis not present

## 2019-08-23 DIAGNOSIS — Z981 Arthrodesis status: Secondary | ICD-10-CM | POA: Diagnosis not present

## 2019-08-23 DIAGNOSIS — Z87891 Personal history of nicotine dependence: Secondary | ICD-10-CM | POA: Diagnosis not present

## 2019-08-23 DIAGNOSIS — G250 Essential tremor: Secondary | ICD-10-CM | POA: Diagnosis not present

## 2019-08-23 DIAGNOSIS — M4316 Spondylolisthesis, lumbar region: Secondary | ICD-10-CM | POA: Diagnosis not present

## 2019-08-23 DIAGNOSIS — K219 Gastro-esophageal reflux disease without esophagitis: Secondary | ICD-10-CM | POA: Diagnosis not present

## 2019-08-23 DIAGNOSIS — E785 Hyperlipidemia, unspecified: Secondary | ICD-10-CM | POA: Diagnosis not present

## 2019-08-23 DIAGNOSIS — I480 Paroxysmal atrial fibrillation: Secondary | ICD-10-CM | POA: Diagnosis not present

## 2019-08-23 DIAGNOSIS — Z955 Presence of coronary angioplasty implant and graft: Secondary | ICD-10-CM | POA: Diagnosis not present

## 2019-08-23 DIAGNOSIS — I110 Hypertensive heart disease with heart failure: Secondary | ICD-10-CM | POA: Diagnosis not present

## 2019-08-23 DIAGNOSIS — I350 Nonrheumatic aortic (valve) stenosis: Secondary | ICD-10-CM | POA: Diagnosis not present

## 2019-08-23 DIAGNOSIS — Z471 Aftercare following joint replacement surgery: Secondary | ICD-10-CM | POA: Diagnosis not present

## 2019-08-26 DIAGNOSIS — M4316 Spondylolisthesis, lumbar region: Secondary | ICD-10-CM | POA: Diagnosis not present

## 2019-08-26 DIAGNOSIS — E119 Type 2 diabetes mellitus without complications: Secondary | ICD-10-CM | POA: Diagnosis not present

## 2019-08-26 DIAGNOSIS — I480 Paroxysmal atrial fibrillation: Secondary | ICD-10-CM | POA: Diagnosis not present

## 2019-08-26 DIAGNOSIS — I6529 Occlusion and stenosis of unspecified carotid artery: Secondary | ICD-10-CM | POA: Diagnosis not present

## 2019-08-26 DIAGNOSIS — E039 Hypothyroidism, unspecified: Secondary | ICD-10-CM | POA: Diagnosis not present

## 2019-08-26 DIAGNOSIS — M109 Gout, unspecified: Secondary | ICD-10-CM | POA: Diagnosis not present

## 2019-08-26 DIAGNOSIS — Z981 Arthrodesis status: Secondary | ICD-10-CM | POA: Diagnosis not present

## 2019-08-26 DIAGNOSIS — G4733 Obstructive sleep apnea (adult) (pediatric): Secondary | ICD-10-CM | POA: Diagnosis not present

## 2019-08-26 DIAGNOSIS — Z85828 Personal history of other malignant neoplasm of skin: Secondary | ICD-10-CM | POA: Diagnosis not present

## 2019-08-26 DIAGNOSIS — Z471 Aftercare following joint replacement surgery: Secondary | ICD-10-CM | POA: Diagnosis not present

## 2019-08-26 DIAGNOSIS — I5032 Chronic diastolic (congestive) heart failure: Secondary | ICD-10-CM | POA: Diagnosis not present

## 2019-08-26 DIAGNOSIS — K219 Gastro-esophageal reflux disease without esophagitis: Secondary | ICD-10-CM | POA: Diagnosis not present

## 2019-08-26 DIAGNOSIS — I2510 Atherosclerotic heart disease of native coronary artery without angina pectoris: Secondary | ICD-10-CM | POA: Diagnosis not present

## 2019-08-26 DIAGNOSIS — G250 Essential tremor: Secondary | ICD-10-CM | POA: Diagnosis not present

## 2019-08-26 DIAGNOSIS — M4802 Spinal stenosis, cervical region: Secondary | ICD-10-CM | POA: Diagnosis not present

## 2019-08-26 DIAGNOSIS — Z955 Presence of coronary angioplasty implant and graft: Secondary | ICD-10-CM | POA: Diagnosis not present

## 2019-08-26 DIAGNOSIS — I350 Nonrheumatic aortic (valve) stenosis: Secondary | ICD-10-CM | POA: Diagnosis not present

## 2019-08-26 DIAGNOSIS — E785 Hyperlipidemia, unspecified: Secondary | ICD-10-CM | POA: Diagnosis not present

## 2019-08-26 DIAGNOSIS — Z86010 Personal history of colonic polyps: Secondary | ICD-10-CM | POA: Diagnosis not present

## 2019-08-26 DIAGNOSIS — I110 Hypertensive heart disease with heart failure: Secondary | ICD-10-CM | POA: Diagnosis not present

## 2019-08-26 DIAGNOSIS — Z96643 Presence of artificial hip joint, bilateral: Secondary | ICD-10-CM | POA: Diagnosis not present

## 2019-08-26 DIAGNOSIS — I252 Old myocardial infarction: Secondary | ICD-10-CM | POA: Diagnosis not present

## 2019-08-26 DIAGNOSIS — Z87891 Personal history of nicotine dependence: Secondary | ICD-10-CM | POA: Diagnosis not present

## 2019-08-27 ENCOUNTER — Telehealth: Payer: Self-pay | Admitting: *Deleted

## 2019-08-27 ENCOUNTER — Ambulatory Visit (INDEPENDENT_AMBULATORY_CARE_PROVIDER_SITE_OTHER): Payer: Medicare Other | Admitting: Internal Medicine

## 2019-08-27 ENCOUNTER — Ambulatory Visit (INDEPENDENT_AMBULATORY_CARE_PROVIDER_SITE_OTHER): Payer: Medicare Other | Admitting: Orthopaedic Surgery

## 2019-08-27 ENCOUNTER — Encounter: Payer: Self-pay | Admitting: Orthopaedic Surgery

## 2019-08-27 ENCOUNTER — Encounter: Payer: Self-pay | Admitting: Internal Medicine

## 2019-08-27 ENCOUNTER — Other Ambulatory Visit: Payer: Self-pay

## 2019-08-27 VITALS — BP 166/82 | HR 86 | Temp 98.9°F | Ht 68.0 in | Wt 218.0 lb

## 2019-08-27 DIAGNOSIS — N451 Epididymitis: Secondary | ICD-10-CM | POA: Diagnosis not present

## 2019-08-27 DIAGNOSIS — I10 Essential (primary) hypertension: Secondary | ICD-10-CM | POA: Diagnosis not present

## 2019-08-27 DIAGNOSIS — I5032 Chronic diastolic (congestive) heart failure: Secondary | ICD-10-CM | POA: Diagnosis not present

## 2019-08-27 DIAGNOSIS — Z96642 Presence of left artificial hip joint: Secondary | ICD-10-CM

## 2019-08-27 MED ORDER — CIPROFLOXACIN HCL 500 MG PO TABS: 500.0000 mg | ORAL_TABLET | Freq: Two times a day (BID) | ORAL | 0 refills | Status: AC

## 2019-08-27 MED ORDER — TIZANIDINE HCL 4 MG PO TABS: 4.0000 mg | ORAL_TABLET | Freq: Three times a day (TID) | ORAL | 0 refills | Status: DC | PRN

## 2019-08-27 MED ORDER — HYDROMORPHONE HCL 2 MG PO TABS: 2.0000 mg | ORAL_TABLET | Freq: Four times a day (QID) | ORAL | 0 refills | Status: DC | PRN

## 2019-08-27 NOTE — Telephone Encounter (Signed)
I sent it in again 

## 2019-08-27 NOTE — Telephone Encounter (Signed)
Patient's wife called and states that the refill of Dilaudid went through to the pharmacy, but the Tizanidine did not go through. I see it was ordered. I also checked with [facility] and they did not get it. Thanks.

## 2019-08-27 NOTE — Progress Notes (Signed)
Subjective:    Patient ID: Troy Banks, male    DOB: 11/02/1940, 79 y.o.   MRN: [id number]  HPI  Here with 1 wk onset left upper testicle tender swelling, without fever, Denies urinary symptoms such as dysuria, frequency, urgency, flank pain, hematuria or n/v, fever, chills. Pt denies chest pain, increased sob or doe, wheezing, orthopnea, PND, increased LE swelling, palpitations, dizziness or syncope.  Pt denies new neurological symptoms such as new headache, or facial or extremity weakness or numbness   Pt denies polydipsia, polyuria Past Medical History:  Diagnosis Date  . Allergy   . Anxiety    PTSD  . Aortic stenosis, moderate 03/27/2015   post AVR echo Echo 11/16: Mild LVH, EF 55-60%, normal wall motion, grade 1 diastolic dysfunction, bioprosthetic AVR okay (mean gradient 18 mmHg) with trivial AI, mild LAE, atrial septal aneurysm, small effusion   . Arthritis    "qwhere" (10/29/2015)  . Cancer ([place])    skin cancer  . Carotid artery stenosis    Carotid US 11/17: 1-39% bilateral ICA stenosis. F/u prn  . Cervical stenosis of spinal canal    fusion 2006  . CHF (congestive heart failure) ([place])    "secondry to OHS"  . Closed left pilon fracture   . COLONIC POLYPS, HX OF   . Complication of anesthesia    "takes a lot to put him under and has woken up during surgery" Difficult to wake up . PTSD; "does not metabolize RX well"that he gets "violent", per pt.   . Coronary artery disease   . DIVERTICULOSIS, COLON   . Dysrhythmia    2 bouts of afib post op and at home but corrected with medication.  . Familial tremor 03/19/2014  . Family history of adverse reaction to anesthesia    Father - hold to get asleep  . GERD (gastroesophageal reflux disease)    uses Zantac  . GOUT   . Headache   . Heart murmur    had aortic value replacement  . High cholesterol   . History of blood transfusion   . History of kidney stones    surgery  . HYPERTENSION    off ACEI 2010 because of  hyperkalemia in setting of ARI  . Hypothyroidism    newly diaganosed 09/2015  . Myocardial infarction [facility])    October 20th 2016  . OSA on CPAP   . Pneumonia   . 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 PTSD (post-traumatic stress disorder)   . PTSD (post-traumatic stress disorder)   . Sleep apnea    On a CPAP   Past Surgical History:  Procedure Laterality Date  . ANKLE FUSION Left 03/17/2017   Procedure: FUSION  PILON FRACTURE WITH BONE GRAFTING;  Surgeon: Shona Needles, MD;  Location: [place];  Service: Orthopedics;  Laterality: Left;  . ANTERIOR FUSION CERVICAL SPINE  2002  . AORTIC VALVE REPLACEMENT N/A 03/30/2015   Procedure: AORTIC VALVE REPLACEMENT (AVR);  Surgeon: Grace Isaac, MD;  Location: [place];  Service: Open Heart Surgery;  Laterality: N/A;  . CARDIAC CATHETERIZATION N/A 03/26/2015   Procedure: Left Heart Cath and Coronary Angiography;  Surgeon: Leonie Man, MD;  Location: [place] [facility];  Service: Cardiovascular;  Laterality: N/A;  . CARDIAC VALVE REPLACEMENT    . COLONOSCOPY W/ POLYPECTOMY    . CORONARY ARTERY BYPASS GRAFT N/A 03/30/2015   Procedure: CORONARY ARTERY BYPASS  GRAFTING (CABG) x four, using left internal mammary artery and left    leg greater saphenous vein harvested endoscopically ;  Surgeon: Grace Isaac, MD;  Location: [place];  Service: Open Heart Surgery;  Laterality: N/A;  . CYSTOSCOPY/URETEROSCOPY/HOLMIUM LASER/STENT PLACEMENT Bilateral 11/07/2016   Procedure: CYSTOSCOPY/URETEROSCOPY/HOLMIUM LASER/STENT PLACEMENT;  Surgeon: Nickie Retort, MD;  Location: [facility];  Service: Urology;  Laterality: Bilateral;  . CYSTOSCOPY/URETEROSCOPY/HOLMIUM LASER/STENT PLACEMENT Bilateral 11/23/2016   Procedure: CYSTOSCOPY/BILATERAL URETEROSCOPY/HOLMIUM LASER/STENT EXCHANGE;  Surgeon: Nickie Retort, MD;  Location: [facility];  Service: Urology;  Laterality: Bilateral;  NEEDS 90 MIN     . EXTERNAL FIXATION LEG Left 02/22/2017   Procedure: EXTERNAL FIXATION LEFT ANKLE;  Surgeon: Renette Butters, MD;  Location: [place];  Service: Orthopedics;  Laterality: Left;  . HEMORRHOID BANDING  1970s  . INGUINAL HERNIA REPAIR Left 1962  . [place]   "cut me open"  . LEFT HEART CATH AND CORS/GRAFTS ANGIOGRAPHY N/A 10/25/2018   Procedure: LEFT HEART CATH AND CORS/GRAFTS ANGIOGRAPHY;  Surgeon: Leonie Man, MD;  Location: [place] [facility];  Service: Cardiovascular;  Laterality: N/A;  . POSTERIOR FUSION CERVICAL SPINE  2006  . TEE WITHOUT CARDIOVERSION N/A 03/30/2015   Procedure: TRANSESOPHAGEAL ECHOCARDIOGRAM (TEE);  Surgeon: Grace Isaac, MD;  Location: [place];  Service: Open Heart Surgery;  Laterality: N/A;  . TESTICLE TORSION REDUCTION Right 1960  . TOE FUSION Left 1985 & 2004   "great toe" and removal  . TONSILLECTOMY    . TOTAL HIP ARTHROPLASTY Right 10/27/2015   Procedure: RIGHT TOTAL HIP ARTHROPLASTY ANTERIOR APPROACH and steroid injection right foot;  Surgeon: Mcarthur Rossetti, MD;  Location: [place];  Service: Orthopedics;  Laterality: Right;  . TOTAL HIP ARTHROPLASTY Left 08/13/2019  . TOTAL HIP ARTHROPLASTY Left 08/13/2019   Procedure: LEFT TOTAL HIP ARTHROPLASTY ANTERIOR APPROACH;  Surgeon: Mcarthur Rossetti, MD;  Location: [place];  Service: Orthopedics;  Laterality: Left;    reports that he quit smoking about 29 years ago. His smoking use included cigarettes. He has a 34.00 pack-year smoking history. He has never used smokeless tobacco. He reports current alcohol use of about 3.0 standard drinks of alcohol per week. He reports that he does not use drugs. family history includes Alcohol abuse in an other family member; Arthritis in an other family member; Diabetes in his mother and paternal grandfather; Drug abuse in his sister; Healthy in his brother, daughter, and son; Heart disease in his father, maternal grandfather, maternal grandmother,  mother, and paternal grandfather; Hypertension in his father, maternal grandfather, maternal grandmother, mother, and paternal grandfather; Prostate cancer in his maternal uncle; Tremor in his sister. Allergies  Allergen Reactions  . Acyclovir And Related Other (See Comments)    Accumulates and causes stroke-like side-effects due to cytochrome P450 enzyme deficiency  . Benzodiazepines Other (See Comments)    Patient states he gets stroke symptoms with benzo's.   . Compazine [Prochlorperazine Maleate] Other (See Comments)    Accumulates and causes stroke-like side-effects due to cytochrome P450 enzyme deficiency  . Coreg [Carvedilol] Other (See Comments)    Accumulates and causes stroke-like side-effects due to cytochrome P450 enzyme deficiency Patient poor metabolizer of CYP2D6 - Coreg undergoes extensive hepatic (including 2D6)  . Flomax [Tamsulosin Hcl] Other (See Comments)    Accumulates and causes stroke-like side-effects due to cytochrome P450 enzyme deficiency  . Lisinopril Other (See Comments)    Hyperkalemia. Accumulates and causes stroke-like side-effects due to cytochrome  P450 enzyme deficiency.  . Mobic [Meloxicam] Other (See Comments)    Accumulates and causes stroke-like side-effects due to cytochrome P450 enzyme deficiency  . Ondansetron Other (See Comments)    Accumulates and causes stroke-like side-effects due to cytochrome P450 enzyme deficiency   . Promethazine Other (See Comments)    Accumulates and causes stroke-like side-effects due to cytochrome P450 enzyme deficiency  . Sertraline Hcl Other (See Comments)    Accumulates and causes stroke-like side-effects due to cytochrome P450 enzyme deficiency  . Tramadol Other (See Comments)    Accumulates and causes stroke-like side-effects due to cytochrome P450 enzyme deficiency  . Atorvastatin Other (See Comments)    MYALGIAS  . Colchicine Other (See Comments)    ataxia   Current Outpatient Medications on File Prior to  Visit  Medication Sig Dispense Refill  . allopurinol (ZYLOPRIM) 300 MG tablet TAKE 1 TABLET BY MOUTH EVERY DAY (Patient taking differently: Take 300 mg by mouth daily. ) 90 tablet 1  . amLODipine (NORVASC) 10 MG tablet TAKE 1 TABLET BY MOUTH EVERY DAY (Patient taking differently: Take 10 mg by mouth every evening. ) 90 tablet 1  . Ascorbic Acid (VITAMIN C) 1000 MG tablet Take 1,000 mg by mouth 2 (two) times daily.     [name] aspirin 81 MG chewable tablet Chew 1 tablet (81 mg total) by mouth 2 (two) times daily. 35 tablet 0  . calcium carbonate (TUMS EX) 750 MG chewable tablet Chew 1 tablet by mouth daily as needed for heartburn.     . Carboxymethylcellulose Sodium (LUBRICANT EYE DROPS OP) Place 1 drop into both eyes 2 (two) times daily as needed (dry/irritated eyes.).     [name] Cholecalciferol (VITAMIN D-3) 125 MCG (5000 UT) TABS Take 5,000 Units by mouth 2 (two) times a day.    . famotidine (PEPCID) 40 MG tablet Take 1 tablet (40 mg total) by mouth daily. 90 tablet 3  . furosemide (LASIX) 40 MG tablet TAKE 1 TABLET BY MOUTH EVERY OTHER DAY 45 tablet 2  . HYDROmorphone (DILAUDID) 2 MG tablet Take 1 tablet (2 mg total) by mouth every 6 (six) hours as needed for severe pain. 30 tablet 0  . levothyroxine (SYNTHROID) 25 MCG tablet Take 1 tablet (25 mcg total) by mouth daily before breakfast. Annual appt due in May must see provider for future refills (Patient taking differently: Take 25 mcg by mouth daily before breakfast. ) 90 tablet 0  . nitroGLYCERIN (NITROSTAT) 0.4 MG SL tablet PLACE 1 TABLET UNDER THE TONGUE EVERY 5 MINUTES AS NEEDED FOR CHEST PAIN. (Patient taking differently: Place 0.4 mg under the tongue every 5 (five) minutes as needed for chest pain. ) 25 tablet 3  . primidone (MYSOLINE) 50 MG tablet Take 4 tablets (200 mg total) by mouth in the morning and at bedtime. 720 tablet 1  . rosuvastatin (CRESTOR) 10 MG tablet TAKE 1/2 TABLET BY MOUTH EVERY DAY (Patient taking differently: Take 5 mg by mouth  daily. ) 45 tablet 1  . Turmeric 500 MG CAPS Take 500 mg by mouth 2 (two) times daily.     . vitamin B-12 (CYANOCOBALAMIN) 1000 MCG tablet Take 1,000 mcg by mouth 2 (two) times daily.     . pantoprazole (PROTONIX) 40 MG tablet Take 1 tablet (40 mg total) by mouth daily. (Patient not taking: Reported on 08/27/2019) 90 tablet 3  . [DISCONTINUED] ranitidine (ZANTAC) 150 MG tablet TAKE 1 TABLET BY MOUTH 2 TIMES DAILY (Patient taking differently: Take 150 mg by  mouth 2 (two) times a day. ) 180 tablet 0   Current Facility-Administered Medications on File Prior to Visit  Medication Dose Route Frequency Provider Last Rate Last Admin  . sodium chloride flush (NS) 0.9 % injection 3 mL  3 mL Intravenous Q12H Almyra Deforest, PA       Review of Systems All otherwise neg per pt     Objective:   Physical Exam BP (!) 166/82   Pulse 86   Temp 98.9 F (37.2 C)   Ht 5\' 8"  (1.727 m)   Wt 218 lb (98.9 kg)   SpO2 100%   BMI 33.15 kg/m  VS noted,  Constitutional: Pt appears in NAD HENT: Head: NCAT.  Right Ear: External ear normal.  Left Ear: External ear normal.  Eyes: . Pupils are equal, round, and reactive to light. Conjunctivae and EOM are normal Nose: without d/c or deformity Neck: Neck supple. Gross normal ROM Cardiovascular: Normal rate and regular rhythm.   Pulmonary/Chest: Effort normal and breath sounds without rales or wheezing.  Abd:  Soft, NT, ND, + BS, no organomegaly Gu; + tender swelling left epididymus Neurological: Pt is alert. At baseline orientation, motor grossly intact Skin: Skin is warm. No rashes, other new lesions, no LE edema Psychiatric: Pt behavior is normal without agitation  All otherwise neg per pt   Lab Results  Component Value Date   WBC 8.2 08/14/2019   HGB 13.3 08/14/2019   HCT 39.2 08/14/2019   PLT 141 (L) 08/14/2019   GLUCOSE 129 (H) 08/14/2019   CHOL 129 11/02/2018   TRIG 97.0 11/02/2018   HDL 44.50 11/02/2018   LDLDIRECT 100.0 09/08/2014   LDLCALC 65  11/02/2018   ALT 12 10/31/2017   AST 14 10/31/2017   NA 136 08/14/2019   K 3.6 08/14/2019   CL 103 08/14/2019   CREATININE 0.97 08/14/2019   BUN 19 08/14/2019   CO2 23 08/14/2019   TSH 4.75 (H) 11/02/2018   PSA 1.39 11/02/2018   INR 1.47 03/30/2015   HGBA1C 5.9 11/02/2018   MICROALBUR 4.3 (H) 10/31/2017      Assessment & Plan:

## 2019-08-27 NOTE — Progress Notes (Signed)
The patient comes in today 2 weeks status post a left total hip arthroplasty.  He reports that he is doing well overall.  He is ambulate with a cane.  He has been getting home health therapy.  He has a remote history of a right total hip that we did which he feels like took him about 3 weeks to get over.  He has been on Dilaudid and Zanaflex.  He does need refills of these.  He is also been taking a baby aspirin twice daily per her request.  On exam there is no calf or foot swelling.  His left hip incision looks great to remove the staples in place Steri-Strips.  His leg lengths are equal.  He will continue to increase his activities as comfort allows.  He can drive since he is not having a narcotic in his system.  I did refill his Dilaudid and Zanaflex.  He will go back to just once a day baby aspirin.  All question concerns were answered addressed.  We will see him back in 4 weeks to see how he is doing overall but no x-rays are needed.

## 2019-08-27 NOTE — Telephone Encounter (Signed)
Ortho bundle 14 day call/in office visit completed.

## 2019-08-27 NOTE — Patient Instructions (Signed)
Please take all new medication as prescribed - the antibiotic  Please continue all other medications as before, and refills have been done if requested.  Please have the pharmacy call with any other refills you may need.  Please continue your efforts at being more active, low cholesterol diet, and weight control.  Please keep your appointments with your specialists as you may have planned    

## 2019-08-28 ENCOUNTER — Encounter: Payer: Self-pay | Admitting: Internal Medicine

## 2019-08-28 DIAGNOSIS — K219 Gastro-esophageal reflux disease without esophagitis: Secondary | ICD-10-CM | POA: Diagnosis not present

## 2019-08-28 DIAGNOSIS — Z86010 Personal history of colonic polyps: Secondary | ICD-10-CM | POA: Diagnosis not present

## 2019-08-28 DIAGNOSIS — I350 Nonrheumatic aortic (valve) stenosis: Secondary | ICD-10-CM | POA: Diagnosis not present

## 2019-08-28 DIAGNOSIS — G4733 Obstructive sleep apnea (adult) (pediatric): Secondary | ICD-10-CM | POA: Diagnosis not present

## 2019-08-28 DIAGNOSIS — N451 Epididymitis: Secondary | ICD-10-CM | POA: Insufficient documentation

## 2019-08-28 DIAGNOSIS — I480 Paroxysmal atrial fibrillation: Secondary | ICD-10-CM | POA: Diagnosis not present

## 2019-08-28 DIAGNOSIS — E039 Hypothyroidism, unspecified: Secondary | ICD-10-CM | POA: Diagnosis not present

## 2019-08-28 DIAGNOSIS — Z87891 Personal history of nicotine dependence: Secondary | ICD-10-CM | POA: Diagnosis not present

## 2019-08-28 DIAGNOSIS — E119 Type 2 diabetes mellitus without complications: Secondary | ICD-10-CM | POA: Diagnosis not present

## 2019-08-28 DIAGNOSIS — G250 Essential tremor: Secondary | ICD-10-CM | POA: Diagnosis not present

## 2019-08-28 DIAGNOSIS — E785 Hyperlipidemia, unspecified: Secondary | ICD-10-CM | POA: Diagnosis not present

## 2019-08-28 DIAGNOSIS — M4802 Spinal stenosis, cervical region: Secondary | ICD-10-CM | POA: Diagnosis not present

## 2019-08-28 DIAGNOSIS — Z955 Presence of coronary angioplasty implant and graft: Secondary | ICD-10-CM | POA: Diagnosis not present

## 2019-08-28 DIAGNOSIS — I110 Hypertensive heart disease with heart failure: Secondary | ICD-10-CM | POA: Diagnosis not present

## 2019-08-28 DIAGNOSIS — Z96643 Presence of artificial hip joint, bilateral: Secondary | ICD-10-CM | POA: Diagnosis not present

## 2019-08-28 DIAGNOSIS — Z981 Arthrodesis status: Secondary | ICD-10-CM | POA: Diagnosis not present

## 2019-08-28 DIAGNOSIS — Z85828 Personal history of other malignant neoplasm of skin: Secondary | ICD-10-CM | POA: Diagnosis not present

## 2019-08-28 DIAGNOSIS — M4316 Spondylolisthesis, lumbar region: Secondary | ICD-10-CM | POA: Diagnosis not present

## 2019-08-28 DIAGNOSIS — I6529 Occlusion and stenosis of unspecified carotid artery: Secondary | ICD-10-CM | POA: Diagnosis not present

## 2019-08-28 DIAGNOSIS — I2510 Atherosclerotic heart disease of native coronary artery without angina pectoris: Secondary | ICD-10-CM | POA: Diagnosis not present

## 2019-08-28 DIAGNOSIS — I5032 Chronic diastolic (congestive) heart failure: Secondary | ICD-10-CM | POA: Diagnosis not present

## 2019-08-28 DIAGNOSIS — Z471 Aftercare following joint replacement surgery: Secondary | ICD-10-CM | POA: Diagnosis not present

## 2019-08-28 DIAGNOSIS — M109 Gout, unspecified: Secondary | ICD-10-CM | POA: Diagnosis not present

## 2019-08-28 DIAGNOSIS — I252 Old myocardial infarction: Secondary | ICD-10-CM | POA: Diagnosis not present

## 2019-08-28 NOTE — Assessment & Plan Note (Signed)
stable overall by history and exam, recent data reviewed with pt, and pt to continue medical treatment as before,  to f/u any worsening symptoms or concerns  

## 2019-08-28 NOTE — Assessment & Plan Note (Signed)
bP stable at home, likley reactive, to continue to follow at home and next visit

## 2019-08-28 NOTE — Assessment & Plan Note (Signed)
Mild to mod, for antibx course,  to f/u any worsening symptoms or concerns 

## 2019-08-30 DIAGNOSIS — I5032 Chronic diastolic (congestive) heart failure: Secondary | ICD-10-CM | POA: Diagnosis not present

## 2019-08-30 DIAGNOSIS — I480 Paroxysmal atrial fibrillation: Secondary | ICD-10-CM | POA: Diagnosis not present

## 2019-08-30 DIAGNOSIS — E785 Hyperlipidemia, unspecified: Secondary | ICD-10-CM | POA: Diagnosis not present

## 2019-08-30 DIAGNOSIS — Z96643 Presence of artificial hip joint, bilateral: Secondary | ICD-10-CM | POA: Diagnosis not present

## 2019-08-30 DIAGNOSIS — M109 Gout, unspecified: Secondary | ICD-10-CM | POA: Diagnosis not present

## 2019-08-30 DIAGNOSIS — I350 Nonrheumatic aortic (valve) stenosis: Secondary | ICD-10-CM | POA: Diagnosis not present

## 2019-08-30 DIAGNOSIS — Z955 Presence of coronary angioplasty implant and graft: Secondary | ICD-10-CM | POA: Diagnosis not present

## 2019-08-30 DIAGNOSIS — Z981 Arthrodesis status: Secondary | ICD-10-CM | POA: Diagnosis not present

## 2019-08-30 DIAGNOSIS — G4733 Obstructive sleep apnea (adult) (pediatric): Secondary | ICD-10-CM | POA: Diagnosis not present

## 2019-08-30 DIAGNOSIS — M4316 Spondylolisthesis, lumbar region: Secondary | ICD-10-CM | POA: Diagnosis not present

## 2019-08-30 DIAGNOSIS — I252 Old myocardial infarction: Secondary | ICD-10-CM | POA: Diagnosis not present

## 2019-08-30 DIAGNOSIS — Z86010 Personal history of colonic polyps: Secondary | ICD-10-CM | POA: Diagnosis not present

## 2019-08-30 DIAGNOSIS — Z87891 Personal history of nicotine dependence: Secondary | ICD-10-CM | POA: Diagnosis not present

## 2019-08-30 DIAGNOSIS — E039 Hypothyroidism, unspecified: Secondary | ICD-10-CM | POA: Diagnosis not present

## 2019-08-30 DIAGNOSIS — I110 Hypertensive heart disease with heart failure: Secondary | ICD-10-CM | POA: Diagnosis not present

## 2019-08-30 DIAGNOSIS — Z85828 Personal history of other malignant neoplasm of skin: Secondary | ICD-10-CM | POA: Diagnosis not present

## 2019-08-30 DIAGNOSIS — E119 Type 2 diabetes mellitus without complications: Secondary | ICD-10-CM | POA: Diagnosis not present

## 2019-08-30 DIAGNOSIS — I6529 Occlusion and stenosis of unspecified carotid artery: Secondary | ICD-10-CM | POA: Diagnosis not present

## 2019-08-30 DIAGNOSIS — M4802 Spinal stenosis, cervical region: Secondary | ICD-10-CM | POA: Diagnosis not present

## 2019-08-30 DIAGNOSIS — G250 Essential tremor: Secondary | ICD-10-CM | POA: Diagnosis not present

## 2019-08-30 DIAGNOSIS — Z471 Aftercare following joint replacement surgery: Secondary | ICD-10-CM | POA: Diagnosis not present

## 2019-08-30 DIAGNOSIS — K219 Gastro-esophageal reflux disease without esophagitis: Secondary | ICD-10-CM | POA: Diagnosis not present

## 2019-08-30 DIAGNOSIS — I2510 Atherosclerotic heart disease of native coronary artery without angina pectoris: Secondary | ICD-10-CM | POA: Diagnosis not present

## 2019-09-06 NOTE — Telephone Encounter (Signed)
We did discuss this on his last visit and due to his reactions to prior vaccines we decided to forgo vaccination at this time. OK to give him a letter saying that due to his medical condition vaccine is deferred.  Takuya Lariccia [name] MD, [facility]

## 2019-09-13 ENCOUNTER — Telehealth: Payer: Self-pay | Admitting: *Deleted

## 2019-09-13 NOTE — Telephone Encounter (Signed)
30 day Ortho bundle call completed.

## 2019-09-16 ENCOUNTER — Encounter: Payer: Self-pay | Admitting: Internal Medicine

## 2019-09-16 DIAGNOSIS — N5082 Scrotal pain: Secondary | ICD-10-CM

## 2019-09-17 ENCOUNTER — Other Ambulatory Visit: Payer: Self-pay | Admitting: Internal Medicine

## 2019-09-18 ENCOUNTER — Encounter: Payer: Self-pay | Admitting: Podiatry

## 2019-09-18 ENCOUNTER — Ambulatory Visit: Payer: Medicare Other | Admitting: Podiatry

## 2019-09-18 ENCOUNTER — Other Ambulatory Visit: Payer: Self-pay

## 2019-09-18 VITALS — Temp 96.9°F

## 2019-09-18 DIAGNOSIS — B351 Tinea unguium: Secondary | ICD-10-CM

## 2019-09-18 DIAGNOSIS — M79675 Pain in left toe(s): Secondary | ICD-10-CM

## 2019-09-18 DIAGNOSIS — M79674 Pain in right toe(s): Secondary | ICD-10-CM | POA: Diagnosis not present

## 2019-09-18 NOTE — Progress Notes (Signed)
This patient returns to the office for evaluation and treatment of long thick painful nails .  This patient is unable to trim his own nails since the patient cannot reach the feet.  Patient says the nails are painful walking and wearing his shoes.  He returns for preventive foot care services.  Patient has not been seen in over 1 year.  General Appearance  Alert, conversant and in no acute stress.  Vascular  Dorsalis pedis and posterior tibial  pulses are palpable  bilaterally.  Capillary return is within normal limits  bilaterally. Temperature is within normal limits  bilaterally.  Neurologic  Senn-Weinstein monofilament wire test within normal limits  bilaterally. Muscle power within normal limits bilaterally.  Nails Thick disfigured discolored nails with subungual debris  from hallux to fifth toes bilaterally. No evidence of bacterial infection or drainage bilaterally.  Orthopedic  No limitations of motion  feet .  No crepitus or effusions noted.  No bony pathology or digital deformities noted.  Skin  normotropic skin with no porokeratosis noted bilaterally.  No signs of infections or ulcers noted.     Onychomycosis  Pain in toes right foot  Pain in toes left foot  Debridement  of nails  1-5  B/L with a nail nipper.  Nails were then filed using a dremel tool with no incidents.    RTC  4 months    Gardiner Barefoot DPM

## 2019-09-24 ENCOUNTER — Ambulatory Visit (INDEPENDENT_AMBULATORY_CARE_PROVIDER_SITE_OTHER): Payer: Medicare Other | Admitting: Orthopaedic Surgery

## 2019-09-24 ENCOUNTER — Encounter: Payer: Self-pay | Admitting: Orthopaedic Surgery

## 2019-09-24 ENCOUNTER — Other Ambulatory Visit: Payer: Self-pay

## 2019-09-24 DIAGNOSIS — Z96642 Presence of left artificial hip joint: Secondary | ICD-10-CM

## 2019-09-24 NOTE — Progress Notes (Signed)
The patient is now 6 weeks status post a left total hip arthroplasty.  We actually replaced his right hip 3 years ago. He states that this left hip pain is had a more difficult recovery and is experiencing more pain than he did after his right hip. That has been more disappointing to him. He is walking without an assistive device at this point. He is off of muscle relaxants and pain medication.  On exam he gets up out of the chair easily. Both hips move smoothly and fluidly. There is nothing worrisome on his exam today. I did have a long and thorough discussion with the patient and his wife about his hip replacement surgery. It is certainly common to experience more pain on one hip than the other as it relates to the surgery. Based on how he looks at 6 weeks, I do feel that he is making great progress and doing well. I gave him reassurance that he is still on the right track for having good recovery.  From my standpoint I really do not need to see him back for 6 months unless things worsen anyway. At that visit I like a standing low AP pelvis and lateral of his more recent left hip.

## 2019-09-30 DIAGNOSIS — N451 Epididymitis: Secondary | ICD-10-CM | POA: Diagnosis not present

## 2019-10-07 ENCOUNTER — Other Ambulatory Visit: Payer: Self-pay | Admitting: Cardiology

## 2019-10-10 ENCOUNTER — Other Ambulatory Visit: Payer: Self-pay | Admitting: Internal Medicine

## 2019-10-10 NOTE — Telephone Encounter (Signed)
Please refill as per office routine med refill policy (all routine meds refilled for 3 mo or monthly per pt preference up to one year from last visit, then month to month grace period for 3 mo, then further med refills will have to be denied)  

## 2019-11-07 ENCOUNTER — Encounter: Payer: Medicare Other | Admitting: Internal Medicine

## 2019-11-12 ENCOUNTER — Other Ambulatory Visit: Payer: Self-pay | Admitting: Internal Medicine

## 2019-11-12 NOTE — Telephone Encounter (Signed)
Please refill as per office routine med refill policy (all routine meds refilled for 3 mo or monthly per pt preference up to one year from last visit, then month to month grace period for 3 mo, then further med refills will have to be denied)  

## 2019-11-15 ENCOUNTER — Telehealth: Payer: Self-pay | Admitting: *Deleted

## 2019-11-15 NOTE — Telephone Encounter (Signed)
Attempted 90 day Ortho bundle call. Left VM requesting call back.

## 2019-11-19 ENCOUNTER — Telehealth: Payer: Self-pay | Admitting: *Deleted

## 2019-11-19 NOTE — Telephone Encounter (Signed)
Ortho bundle 90 day call completed. 

## 2019-11-26 ENCOUNTER — Other Ambulatory Visit: Payer: Self-pay

## 2019-11-26 ENCOUNTER — Ambulatory Visit (INDEPENDENT_AMBULATORY_CARE_PROVIDER_SITE_OTHER): Payer: Medicare Other | Admitting: Internal Medicine

## 2019-11-26 ENCOUNTER — Encounter: Payer: Self-pay | Admitting: Internal Medicine

## 2019-11-26 VITALS — BP 140/86 | HR 49 | Temp 98.6°F | Ht 68.0 in | Wt 212.0 lb

## 2019-11-26 DIAGNOSIS — Z0000 Encounter for general adult medical examination without abnormal findings: Secondary | ICD-10-CM

## 2019-11-26 DIAGNOSIS — E039 Hypothyroidism, unspecified: Secondary | ICD-10-CM | POA: Insufficient documentation

## 2019-11-26 DIAGNOSIS — R739 Hyperglycemia, unspecified: Secondary | ICD-10-CM

## 2019-11-26 DIAGNOSIS — E119 Type 2 diabetes mellitus without complications: Secondary | ICD-10-CM

## 2019-11-26 DIAGNOSIS — Z1159 Encounter for screening for other viral diseases: Secondary | ICD-10-CM

## 2019-11-26 DIAGNOSIS — Z0001 Encounter for general adult medical examination with abnormal findings: Secondary | ICD-10-CM

## 2019-11-26 NOTE — Progress Notes (Addendum)
Subjective:    Patient ID: EZEKIAL ARNS, male    DOB: 03-02-1941, 79 y.o.   MRN: [id number]  HPI  Here for wellness and f/u;  Overall doing ok;  Pt denies Chest pain, worsening SOB, DOE, wheezing, orthopnea, PND, worsening LE edema, palpitations, dizziness or syncope.  Pt denies neurological change such as new headache, facial or extremity weakness.  Pt denies polydipsia, polyuria, or low sugar symptoms. Pt states overall good compliance with treatment and medications, good tolerability, and has been trying to follow appropriate diet.  Pt denies worsening depressive symptoms, suicidal ideation or panic. No fever, night sweats, wt loss, loss of appetite, or other constitutional symptoms.  Pt states good ability with ADL's, has low fall risk, home safety reviewed and adequate, no other significant changes in hearing or vision, and only occasionally active with exercise. Denies hyper or hypo thyroid symptoms such as voice, skin or hair change. No new complaints Past Medical History:  Diagnosis Date  . Allergy   . Anxiety    PTSD  . Aortic stenosis, moderate 03/27/2015   post AVR echo Echo 11/16: Mild LVH, EF 55-60%, normal wall motion, grade 1 diastolic dysfunction, bioprosthetic AVR okay (mean gradient 18 mmHg) with trivial AI, mild LAE, atrial septal aneurysm, small effusion   . Arthritis    "qwhere" (10/29/2015)  . Cancer ([place])    skin cancer  . Carotid artery stenosis    Carotid US 11/17: 1-39% bilateral ICA stenosis. F/u prn  . Cervical stenosis of spinal canal    fusion 2006  . CHF (congestive heart failure) ([place])    "secondry to OHS"  . Closed left pilon fracture   . COLONIC POLYPS, HX OF   . Complication of anesthesia    "takes a lot to put him under and has woken up during surgery" Difficult to wake up . PTSD; "does not metabolize RX well"that he gets "violent", per pt.   . Coronary artery disease   . DIVERTICULOSIS, COLON   . Dysrhythmia    2 bouts of afib post op and at  home but corrected with medication.  . Familial tremor 03/19/2014  . Family history of adverse reaction to anesthesia    Father - hold to get asleep  . GERD (gastroesophageal reflux disease)    uses Zantac  . GOUT   . Headache   . Heart murmur    had aortic value replacement  . High cholesterol   . History of blood transfusion   . History of kidney stones    surgery  . HYPERTENSION    off ACEI 2010 because of hyperkalemia in setting of ARI  . Hypothyroidism    newly diaganosed 09/2015  . Myocardial infarction [facility])    October 20th 2016  . OSA on CPAP   . Pneumonia   . 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)    confirmed heterozygous 10/24/14 labs  . PTSD (post-traumatic stress disorder)   . PTSD (post-traumatic stress disorder)   . Sleep apnea    On a CPAP   Past Surgical History:  Procedure Laterality Date  . ANKLE FUSION Left 03/17/2017   Procedure: FUSION  PILON FRACTURE WITH BONE GRAFTING;  Surgeon: Shona Needles, MD;  Location: [facility];  Service: Orthopedics;  Laterality: Left;  . ANTERIOR FUSION CERVICAL SPINE  2002  . AORTIC VALVE REPLACEMENT N/A 03/30/2015   Procedure: AORTIC VALVE REPLACEMENT (AVR);  Surgeon: Grace Isaac, MD;  Location: [place];  Service: Open Heart Surgery;  Laterality: N/A;  . CARDIAC CATHETERIZATION N/A 03/26/2015   Procedure: Left Heart Cath and Coronary Angiography;  Surgeon: Leonie Man, MD;  Location: [facility];  Service: Cardiovascular;  Laterality: N/A;  . CARDIAC VALVE REPLACEMENT    . COLONOSCOPY W/ POLYPECTOMY    . CORONARY ARTERY BYPASS GRAFT N/A 03/30/2015   Procedure: CORONARY ARTERY BYPASS GRAFTING (CABG) x four, using left internal mammary artery and left    leg greater saphenous vein harvested endoscopically ;  Surgeon: Grace Isaac, MD;  Location: [facility];  Service: Open Heart Surgery;  Laterality: N/A;  . CYSTOSCOPY/URETEROSCOPY/HOLMIUM LASER/STENT PLACEMENT Bilateral 11/07/2016    Procedure: CYSTOSCOPY/URETEROSCOPY/HOLMIUM LASER/STENT PLACEMENT;  Surgeon: Nickie Retort, MD;  Location: [facility];  Service: Urology;  Laterality: Bilateral;  . CYSTOSCOPY/URETEROSCOPY/HOLMIUM LASER/STENT PLACEMENT Bilateral 11/23/2016   Procedure: CYSTOSCOPY/BILATERAL URETEROSCOPY/HOLMIUM LASER/STENT EXCHANGE;  Surgeon: Nickie Retort, MD;  Location: [facility];  Service: Urology;  Laterality: Bilateral;  NEEDS 90 MIN   . EXTERNAL FIXATION LEG Left 02/22/2017   Procedure: EXTERNAL FIXATION LEFT ANKLE;  Surgeon: Renette Butters, MD;  Location: [facility];  Service: Orthopedics;  Laterality: Left;  . HEMORRHOID BANDING  1970s  . INGUINAL HERNIA REPAIR Left 1962  . [place]   "cut me open"  . LEFT HEART CATH AND CORS/GRAFTS ANGIOGRAPHY N/A 10/25/2018   Procedure: LEFT HEART CATH AND CORS/GRAFTS ANGIOGRAPHY;  Surgeon: Leonie Man, MD;  Location: [facility];  Service: Cardiovascular;  Laterality: N/A;  . POSTERIOR FUSION CERVICAL SPINE  2006  . TEE WITHOUT CARDIOVERSION N/A 03/30/2015   Procedure: TRANSESOPHAGEAL ECHOCARDIOGRAM (TEE);  Surgeon: Grace Isaac, MD;  Location: [facility];  Service: Open Heart Surgery;  Laterality: N/A;  . TESTICLE TORSION REDUCTION Right 1960  . TOE FUSION Left 1985 & 2004   "great toe" and removal  . TONSILLECTOMY    . TOTAL HIP ARTHROPLASTY Right 10/27/2015   Procedure: RIGHT TOTAL HIP ARTHROPLASTY ANTERIOR APPROACH and steroid injection right foot;  Surgeon: Mcarthur Rossetti, MD;  Location: [facility];  Service: Orthopedics;  Laterality: Right;  . TOTAL HIP ARTHROPLASTY Left 08/13/2019  . TOTAL HIP ARTHROPLASTY Left 08/13/2019   Procedure: LEFT TOTAL HIP ARTHROPLASTY ANTERIOR APPROACH;  Surgeon: Mcarthur Rossetti, MD;  Location: [facility];  Service: Orthopedics;  Laterality: Left;    reports that he quit smoking about 29 years ago. His smoking use included cigarettes. He has a 34.00 pack-year smoking history. He has never used  smokeless tobacco. He reports current alcohol use of about 3.0 standard drinks of alcohol per week. He reports that he does not use drugs. family history includes Alcohol abuse in an other family member; Arthritis in an other family member; Diabetes in his mother and paternal grandfather; Drug abuse in his sister; Healthy in his brother, daughter, and son; Heart disease in his father, maternal grandfather, maternal grandmother, mother, and paternal grandfather; Hypertension in his father, maternal grandfather, maternal grandmother, mother, and paternal grandfather; Prostate cancer in his maternal uncle; Tremor in his sister. Allergies  Allergen Reactions  . Acyclovir And Related Other (See Comments)    Accumulates and causes stroke-like side-effects due to cytochrome P450 enzyme deficiency  . Benzodiazepines Other (See Comments)    Patient states he gets stroke symptoms with benzo's.   . Compazine [Prochlorperazine Maleate] Other (See Comments)    Accumulates and causes stroke-like side-effects due to cytochrome P450 enzyme deficiency  . Coreg [Carvedilol] Other (See  Comments)    Accumulates and causes stroke-like side-effects due to cytochrome P450 enzyme deficiency Patient poor metabolizer of CYP2D6 - Coreg undergoes extensive hepatic (including 2D6)  . Flomax [Tamsulosin Hcl] Other (See Comments)    Accumulates and causes stroke-like side-effects due to cytochrome P450 enzyme deficiency  . Lisinopril Other (See Comments)    Hyperkalemia. 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  . Mobic [Meloxicam] Other (See Comments)    Accumulates and causes stroke-like side-effects due to cytochrome P450 enzyme deficiency  . Ondansetron Other (See Comments)    Accumulates and causes stroke-like side-effects due to cytochrome P450 enzyme deficiency   . Oxycodone Other (See Comments)    Accumulates and causes stroke-like side-effects due to cytochrome P450  enzyme deficiency  . Promethazine Other (See Comments)    Accumulates and causes stroke-like side-effects due to cytochrome P450 enzyme deficiency  . Sertraline Hcl Other (See Comments)    Accumulates and causes stroke-like side-effects due to cytochrome P450 enzyme deficiency  . Tramadol Other (See Comments)    Accumulates and causes stroke-like side-effects due to cytochrome P450 enzyme deficiency  . Atorvastatin Other (See Comments)    MYALGIAS  . Colchicine Other (See Comments)    ataxia   Current Outpatient Medications on File Prior to Visit  Medication Sig Dispense Refill  . allopurinol (ZYLOPRIM) 300 MG tablet TAKE 1 TABLET BY MOUTH EVERY DAY (Patient taking differently: Take 300 mg by mouth daily. ) 90 tablet 1  . amLODipine (NORVASC) 10 MG tablet TAKE 1 TABLET BY MOUTH EVERY DAY (Patient taking differently: Take 10 mg by mouth every evening. ) 90 tablet 1  . Ascorbic Acid (VITAMIN C) 1000 MG tablet Take 1,000 mg by mouth 2 (two) times daily.     [name] aspirin 81 MG chewable tablet Chew 1 tablet (81 mg total) by mouth 2 (two) times daily. 35 tablet 0  . calcium carbonate (TUMS EX) 750 MG chewable tablet Chew 1 tablet by mouth daily as needed for heartburn.     . Carboxymethylcellulose Sodium (LUBRICANT EYE DROPS OP) Place 1 drop into both eyes 2 (two) times daily as needed (dry/irritated eyes.).     [name] Cholecalciferol (VITAMIN D-3) 125 MCG (5000 UT) TABS Take 5,000 Units by mouth 2 (two) times a day.    . famotidine (PEPCID) 40 MG tablet Take 1 tablet (40 mg total) by mouth daily. Annual appt due in May must see provider for future refills 30 tablet 0  . furosemide (LASIX) 40 MG tablet TAKE 1 TABLET BY MOUTH EVERY OTHER DAY 45 tablet 2  . levothyroxine (SYNTHROID) 25 MCG tablet Take 1 tablet (25 mcg total) by mouth daily before breakfast. Annual appt due in May must see provider for future refills 30 tablet 0  . nitroGLYCERIN (NITROSTAT) 0.4 MG SL tablet PLACE 1 TABLET UNDER THE TONGUE  EVERY 5 MINUTES AS NEEDED FOR CHEST PAIN. (Patient taking differently: Place 0.4 mg under the tongue every 5 (five) minutes as needed for chest pain. ) 25 tablet 3  . primidone (MYSOLINE) 50 MG tablet Take 4 tablets (200 mg total) by mouth in the morning and at bedtime. 720 tablet 1  . rosuvastatin (CRESTOR) 10 MG tablet TAKE 1/2 TABLET BY MOUTH EVERY DAY 45 tablet 1  . Turmeric 500 MG CAPS Take 500 mg by mouth 2 (two) times daily.     . vitamin B-12 (CYANOCOBALAMIN) 1000 MCG tablet Take 1,000 mcg by mouth 2 (two) times daily.     . [  DISCONTINUED] ranitidine (ZANTAC) 150 MG tablet TAKE 1 TABLET BY MOUTH 2 TIMES DAILY (Patient taking differently: Take 150 mg by mouth 2 (two) times a day. ) 180 tablet 0   Current Facility-Administered Medications on File Prior to Visit  Medication Dose Route Frequency Provider Last Rate Last Admin  . sodium chloride flush (NS) 0.9 % injection 3 mL  3 mL Intravenous Q12H Almyra Deforest, PA       Review of Systems All otherwise neg per pt     Objective:   Physical Exam BP 140/86 (BP Location: Left Arm, Patient Position: Sitting, Cuff Size: Large)   Pulse (!) 49   Temp 98.6 F (37 C) (Oral)   Ht 5\' 8"  (1.727 m)   Wt 212 lb (96.2 kg)   SpO2 97%   BMI 32.23 kg/m  VS noted,  Constitutional: Pt appears in NAD HENT: Head: NCAT.  Right Ear: External ear normal.  Left Ear: External ear normal.  Eyes: . Pupils are equal, round, and reactive to light. Conjunctivae and EOM are normal Nose: without d/c or deformity Neck: Neck supple. Gross normal ROM Cardiovascular: Normal rate and regular rhythm. With click  Pulmonary/Chest: Effort normal and breath sounds without rales or wheezing.  Abd:  Soft, NT, ND, + BS, no organomegaly Neurological: Pt is alert. At baseline orientation, motor grossly intact Skin: Skin is warm. No rashes, other new lesions, no LE edema Psychiatric: Pt behavior is normal without agitation  Lab Results  Component Value Date   WBC 8.2  08/14/2019   HGB 13.3 08/14/2019   HCT 39.2 08/14/2019   PLT 141 (L) 08/14/2019   GLUCOSE 129 (H) 08/14/2019   CHOL 129 11/02/2018   TRIG 97.0 11/02/2018   HDL 44.50 11/02/2018   LDLDIRECT 100.0 09/08/2014   LDLCALC 65 11/02/2018   ALT 12 10/31/2017   AST 14 10/31/2017   NA 136 08/14/2019   K 3.6 08/14/2019   CL 103 08/14/2019   CREATININE 0.97 08/14/2019   BUN 19 08/14/2019   CO2 23 08/14/2019   TSH 4.75 (H) 11/02/2018   PSA 1.39 11/02/2018   INR 1.47 03/30/2015   HGBA1C 5.9 11/02/2018   MICROALBUR 4.3 (H) 10/31/2017      Assessment & Plan:

## 2019-11-26 NOTE — Patient Instructions (Signed)
Please continue all other medications as before, and refills have been done if requested.  Please have the pharmacy call with any other refills you may need.  Please continue your efforts at being more active, low cholesterol diet, and weight control.  You are otherwise up to date with prevention measures today.  Please keep your appointments with your specialists as you may have planned  Please go to the LAB at the blood drawing area for the tests to be done - at the [place] will be contacted by phone if any changes need to be made immediately.  Otherwise, you will receive a letter about your results with an explanation, but please check with MyChart first.  Please remember to sign up for MyChart if you have not done so, as this will be important to you in the future with finding out test results, communicating by private email, and scheduling acute appointments online when needed.  Please make an Appointment to return for your 1 year visit, or sooner if needed

## 2019-11-26 NOTE — Assessment & Plan Note (Signed)

## 2019-11-26 NOTE — Assessment & Plan Note (Signed)
stable overall by history and exam, recent data reviewed with pt, and pt to continue medical treatment as before,  to f/u any worsening symptoms or concerns  

## 2019-12-04 ENCOUNTER — Other Ambulatory Visit (INDEPENDENT_AMBULATORY_CARE_PROVIDER_SITE_OTHER): Payer: Medicare Other

## 2019-12-04 ENCOUNTER — Other Ambulatory Visit: Payer: Self-pay

## 2019-12-04 ENCOUNTER — Encounter: Payer: Self-pay | Admitting: Psychiatry

## 2019-12-04 ENCOUNTER — Ambulatory Visit (INDEPENDENT_AMBULATORY_CARE_PROVIDER_SITE_OTHER): Payer: Medicare Other | Admitting: Psychiatry

## 2019-12-04 DIAGNOSIS — Z1159 Encounter for screening for other viral diseases: Secondary | ICD-10-CM | POA: Diagnosis not present

## 2019-12-04 DIAGNOSIS — F5101 Primary insomnia: Secondary | ICD-10-CM

## 2019-12-04 DIAGNOSIS — Z125 Encounter for screening for malignant neoplasm of prostate: Secondary | ICD-10-CM

## 2019-12-04 DIAGNOSIS — E039 Hypothyroidism, unspecified: Secondary | ICD-10-CM | POA: Diagnosis not present

## 2019-12-04 DIAGNOSIS — Z0001 Encounter for general adult medical examination with abnormal findings: Secondary | ICD-10-CM | POA: Diagnosis not present

## 2019-12-04 DIAGNOSIS — R739 Hyperglycemia, unspecified: Secondary | ICD-10-CM | POA: Diagnosis not present

## 2019-12-04 LAB — URINALYSIS, ROUTINE W REFLEX MICROSCOPIC
Bilirubin Urine: NEGATIVE
Hgb urine dipstick: NEGATIVE
Ketones, ur: NEGATIVE
Leukocytes,Ua: NEGATIVE
Nitrite: NEGATIVE
RBC / HPF: NONE SEEN (ref 0–?)
Specific Gravity, Urine: 1.015 (ref 1.000–1.030)
Total Protein, Urine: NEGATIVE
Urine Glucose: NEGATIVE
Urobilinogen, UA: 0.2 (ref 0.0–1.0)
pH: 7 (ref 5.0–8.0)

## 2019-12-04 LAB — HEPATIC FUNCTION PANEL
ALT: 22 U/L (ref 0–53)
AST: 19 U/L (ref 0–37)
Albumin: 4.5 g/dL (ref 3.5–5.2)
Alkaline Phosphatase: 58 U/L (ref 39–117)
Bilirubin, Direct: 0.2 mg/dL (ref 0.0–0.3)
Total Bilirubin: 0.8 mg/dL (ref 0.2–1.2)
Total Protein: 6.4 g/dL (ref 6.0–8.3)

## 2019-12-04 LAB — CBC WITH DIFFERENTIAL/PLATELET
Basophils Absolute: 0.1 10*3/uL (ref 0.0–0.1)
Basophils Relative: 1.3 % (ref 0.0–3.0)
Eosinophils Absolute: 0.1 10*3/uL (ref 0.0–0.7)
Eosinophils Relative: 1.7 % (ref 0.0–5.0)
HCT: 44.5 % (ref 39.0–52.0)
Hemoglobin: 15.1 g/dL (ref 13.0–17.0)
Lymphocytes Relative: 25.2 % (ref 12.0–46.0)
Lymphs Abs: 1.4 10*3/uL (ref 0.7–4.0)
MCHC: 33.9 g/dL (ref 30.0–36.0)
MCV: 89.4 fl (ref 78.0–100.0)
Monocytes Absolute: 0.7 10*3/uL (ref 0.1–1.0)
Monocytes Relative: 12.7 % — ABNORMAL HIGH (ref 3.0–12.0)
Neutro Abs: 3.2 10*3/uL (ref 1.4–7.7)
Neutrophils Relative %: 59.1 % (ref 43.0–77.0)
Platelets: 126 10*3/uL — ABNORMAL LOW (ref 150.0–400.0)
RBC: 4.97 Mil/uL (ref 4.22–5.81)
RDW: 14.8 % (ref 11.5–15.5)
WBC: 5.4 10*3/uL (ref 4.0–10.5)

## 2019-12-04 LAB — LIPID PANEL
Cholesterol: 130 mg/dL (ref 0–200)
HDL: 41 mg/dL (ref >39.00)
NonHDL: 89.32
Total CHOL/HDL Ratio: 3
Triglycerides: 285 mg/dL — ABNORMAL HIGH (ref 0.0–149.0)
VLDL: 57 mg/dL — ABNORMAL HIGH (ref 0.0–40.0)

## 2019-12-04 LAB — BASIC METABOLIC PANEL
BUN: 20 mg/dL (ref 6–23)
CO2: 27 mEq/L (ref 19–32)
Calcium: 9.3 mg/dL (ref 8.4–10.5)
Chloride: 105 mEq/L (ref 96–112)
Creatinine, Ser: 0.95 mg/dL (ref 0.40–1.50)
GFR: 76.89 mL/min (ref >60.00)
Glucose, Bld: 122 mg/dL — ABNORMAL HIGH (ref 70–99)
Potassium: 4 mEq/L (ref 3.5–5.1)
Sodium: 140 mEq/L (ref 135–145)

## 2019-12-04 LAB — T4, FREE: Free T4: 0.69 ng/dL (ref 0.60–1.60)

## 2019-12-04 LAB — HEMOGLOBIN A1C: Hgb A1c MFr Bld: 6 % (ref 4.6–6.5)

## 2019-12-04 LAB — LDL CHOLESTEROL, DIRECT: Direct LDL: 63 mg/dL

## 2019-12-04 LAB — TSH: TSH: 2.42 u[IU]/mL (ref 0.35–4.50)

## 2019-12-04 LAB — PSA: PSA: 1.76 ng/mL (ref 0.10–4.00)

## 2019-12-04 NOTE — Progress Notes (Signed)
[facility] MD/PA/NP Initial Note  12/04/2019 10:08 AM Troy Banks  MRN:  [id number]  Chief Complaint:  referred for depression.  HPI: Referred by Dr. Carles Collet bc of depression.  He was curious about why Dr. Carles Collet suggested he see a psychiatrist but guesses it's bc he as bouts of depression.  Covid restricted is activities and contributed.  But says depression is not that often.  Not depressed now.  Last depressed about a year ago.  My therapy is motorcycle.  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  No concerns from wife about his mental health. Thinks one of Dr. Doristine Devoid main concerns centered around discussion of DBS and worried that it might cause him depression.   About 20 years ago treated for depression with counseling and meds and got better after 3 mos and treatment stopped. Can get down if has a problem he can't fix.   Liked [employer] bc had things he could control.  [phone] and should have reenlisted.  Enjoyed truck driving career also.   No psych meds in years.  Pt reports that mood is Euthymic and describes anxiety as Minimal. Anxiety symptoms include: minimal worry. Pt reports awakens early 4 hours at night and an hour nap.  Then after a few days will sleep more. Pt reports that appetite is good. Pt reports that energy is good and good. Concentration is good. Suicidal thoughts:  denied by patient. No anger problems.  Doing more now that Covid has shut down.  No anxiety.  Visit Diagnosis: No diagnosis found.  Past Psychiatric History: as noted.  Cannot remember name of previous psych med 20 years ago.  Past Medical History:  Past Medical History:  Diagnosis Date  . Allergy   . Anxiety    PTSD  . Aortic stenosis, moderate 03/27/2015   post AVR echo Echo 11/16: Mild LVH, EF 55-60%, normal wall motion, grade 1 diastolic dysfunction, bioprosthetic AVR okay (mean gradient 18 mmHg) with trivial AI, mild LAE, atrial septal aneurysm,  small effusion   . Arthritis    "qwhere" (10/29/2015)  . Cancer ([place])    skin cancer  . Carotid artery stenosis    Carotid US 11/17: 1-39% bilateral ICA stenosis. F/u prn  . Cervical stenosis of spinal canal    fusion 2006  . CHF (congestive heart failure) ([place])    "secondry to OHS"  . Closed left pilon fracture   . COLONIC POLYPS, HX OF   . Complication of anesthesia    "takes a lot to put him under and has woken up during surgery" Difficult to wake up . PTSD; "does not metabolize RX well"that he gets "violent", per pt.   . Coronary artery disease   . DIVERTICULOSIS, COLON   . Dysrhythmia    2 bouts of afib post op and at home but corrected with medication.  . Familial tremor 03/19/2014  . Family history of adverse reaction to anesthesia    Father - hold to get asleep  . GERD (gastroesophageal reflux disease)    uses Zantac  . GOUT   . Headache   . Heart murmur    had aortic value replacement  . High cholesterol   . History of blood transfusion   . History of kidney stones    surgery  . HYPERTENSION    off ACEI 2010 because of hyperkalemia in setting of ARI  . Hypothyroidism    newly diaganosed 09/2015  . Myocardial  infarction [facility])    October 20th 2016  . OSA on CPAP   . Pneumonia   . 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 PTSD (post-traumatic stress disorder)   . PTSD (post-traumatic stress disorder)   . Sleep apnea    On a CPAP    Past Surgical History:  Procedure Laterality Date  . ANKLE FUSION Left 03/17/2017   Procedure: FUSION  PILON FRACTURE WITH BONE GRAFTING;  Surgeon: Shona Needles, MD;  Location: [facility];  Service: Orthopedics;  Laterality: Left;  . ANTERIOR FUSION CERVICAL SPINE  2002  . AORTIC VALVE REPLACEMENT N/A 03/30/2015   Procedure: AORTIC VALVE REPLACEMENT (AVR);  Surgeon: Grace Isaac, MD;  Location: [facility];  Service: Open Heart Surgery;  Laterality: N/A;  . CARDIAC  CATHETERIZATION N/A 03/26/2015   Procedure: Left Heart Cath and Coronary Angiography;  Surgeon: Leonie Man, MD;  Location: [facility];  Service: Cardiovascular;  Laterality: N/A;  . CARDIAC VALVE REPLACEMENT    . COLONOSCOPY W/ POLYPECTOMY    . CORONARY ARTERY BYPASS GRAFT N/A 03/30/2015   Procedure: CORONARY ARTERY BYPASS GRAFTING (CABG) x four, using left internal mammary artery and left    leg greater saphenous vein harvested endoscopically ;  Surgeon: Grace Isaac, MD;  Location: [facility];  Service: Open Heart Surgery;  Laterality: N/A;  . CYSTOSCOPY/URETEROSCOPY/HOLMIUM LASER/STENT PLACEMENT Bilateral 11/07/2016   Procedure: CYSTOSCOPY/URETEROSCOPY/HOLMIUM LASER/STENT PLACEMENT;  Surgeon: Nickie Retort, MD;  Location: [facility];  Service: Urology;  Laterality: Bilateral;  . CYSTOSCOPY/URETEROSCOPY/HOLMIUM LASER/STENT PLACEMENT Bilateral 11/23/2016   Procedure: CYSTOSCOPY/BILATERAL URETEROSCOPY/HOLMIUM LASER/STENT EXCHANGE;  Surgeon: Nickie Retort, MD;  Location: [facility];  Service: Urology;  Laterality: Bilateral;  NEEDS 90 MIN   . EXTERNAL FIXATION LEG Left 02/22/2017   Procedure: EXTERNAL FIXATION LEFT ANKLE;  Surgeon: Renette Butters, MD;  Location: [facility];  Service: Orthopedics;  Laterality: Left;  . HEMORRHOID BANDING  1970s  . INGUINAL HERNIA REPAIR Left 1962  . [place]   "cut me open"  . LEFT HEART CATH AND CORS/GRAFTS ANGIOGRAPHY N/A 10/25/2018   Procedure: LEFT HEART CATH AND CORS/GRAFTS ANGIOGRAPHY;  Surgeon: Leonie Man, MD;  Location: [facility];  Service: Cardiovascular;  Laterality: N/A;  . POSTERIOR FUSION CERVICAL SPINE  2006  . TEE WITHOUT CARDIOVERSION N/A 03/30/2015   Procedure: TRANSESOPHAGEAL ECHOCARDIOGRAM (TEE);  Surgeon: Grace Isaac, MD;  Location: [facility];  Service: Open Heart Surgery;  Laterality: N/A;  . TESTICLE TORSION REDUCTION Right 1960  . TOE FUSION Left 1985 & 2004   "great toe" and removal  .  TONSILLECTOMY    . TOTAL HIP ARTHROPLASTY Right 10/27/2015   Procedure: RIGHT TOTAL HIP ARTHROPLASTY ANTERIOR APPROACH and steroid injection right foot;  Surgeon: Mcarthur Rossetti, MD;  Location: [facility];  Service: Orthopedics;  Laterality: Right;  . TOTAL HIP ARTHROPLASTY Left 08/13/2019  . TOTAL HIP ARTHROPLASTY Left 08/13/2019   Procedure: LEFT TOTAL HIP ARTHROPLASTY ANTERIOR APPROACH;  Surgeon: Mcarthur Rossetti, MD;  Location: [facility];  Service: Orthopedics;  Laterality: Left;    Family Psychiatric History: not significant   Family History:  Family History  Problem Relation Age of Onset  . Heart disease Mother   . Hypertension Mother   . Diabetes Mother   . Heart disease Father   . Hypertension Father   . Tremor Sister   . Healthy Brother   .  Healthy Son   . Prostate cancer Maternal Uncle   . Hypertension Maternal Grandmother   . Heart disease Maternal Grandmother   . Hypertension Maternal Grandfather   . Heart disease Maternal Grandfather   . Hypertension Paternal Grandfather   . Heart disease Paternal Grandfather   . Diabetes Paternal Grandfather   . Alcohol abuse Other   . Arthritis Other   . Drug abuse Sister   . Healthy Daughter   . Colon polyps Neg Hx   . Esophageal cancer Neg Hx   . Rectal cancer Neg Hx   . Stomach cancer Neg Hx   . Colon cancer Neg Hx     Social History:  Social History   Socioeconomic History  . Marital status: Married    Spouse name: Karmen Bongo, RN  . Number of children: 2  . Years of education: Not on file  . Highest education level: Some college, no degree  Occupational History  . Occupation: Retired Animal nutritionist  Tobacco Use  . Smoking status: Former Smoker    Packs/day: 1.00    Years: 34.00    Pack years: 34.00    Types: Cigarettes    Quit date: 06/06/1990    Years since quitting: 29.5  . Smokeless tobacco: Never Used  Vaping Use  . Vaping Use: Never used  Substance and Sexual Activity  . Alcohol use:  Yes    Alcohol/week: 3.0 standard drinks    Types: 1 Cans of beer, 2 Shots of liquor per week  . Drug use: No  . Sexual activity: Yes  Other Topics Concern  . Not on file  Social History Narrative  . Not on file   Social Determinants of Health   Financial Resource Strain: Low Risk   . Difficulty of Paying Living Expenses: Not hard at all  Food Insecurity: No Food Insecurity  . Worried About Charity fundraiser in the Last Year: Never true  . Ran Out of Food in the Last Year: Never true  Transportation Needs: Unmet Transportation Needs  . Lack of Transportation (Medical): Yes  . Lack of Transportation (Non-Medical): Yes  Physical Activity:   . Days of Exercise per Week:   . Minutes of Exercise per Session:   Stress: No Stress Concern Present  . Feeling of Stress : Not at all  Social Connections:   . Frequency of Communication with Friends and Family:   . Frequency of Social Gatherings with Friends and Family:   . Attends Religious Services:   . Active Member of Clubs or Organizations:   . Attends Archivist Meetings:   [name] Marital Status:     Allergies:  Allergies  Allergen Reactions  . Acyclovir And Related Other (See Comments)    Accumulates and causes stroke-like side-effects due to cytochrome P450 enzyme deficiency  . Benzodiazepines Other (See Comments)    Patient states he gets stroke symptoms with benzo's.   . Compazine [Prochlorperazine Maleate] Other (See Comments)    Accumulates and causes stroke-like side-effects due to cytochrome P450 enzyme deficiency  . Coreg [Carvedilol] Other (See Comments)    Accumulates and causes stroke-like side-effects due to cytochrome P450 enzyme deficiency Patient poor metabolizer of CYP2D6 - Coreg undergoes extensive hepatic (including 2D6)  . Flomax [Tamsulosin Hcl] Other (See Comments)    Accumulates and causes stroke-like side-effects due to cytochrome P450 enzyme deficiency  . Lisinopril Other (See Comments)     Hyperkalemia. Accumulates and causes stroke-like side-effects due to cytochrome P450 enzyme  deficiency.  . Mobic [Meloxicam] Other (See Comments)    Accumulates and causes stroke-like side-effects due to cytochrome P450 enzyme deficiency  . Ondansetron Other (See Comments)    Accumulates and causes stroke-like side-effects due to cytochrome P450 enzyme deficiency   . Oxycodone Other (See Comments)    Accumulates and causes stroke-like side-effects due to cytochrome P450 enzyme deficiency  . Promethazine Other (See Comments)    Accumulates and causes stroke-like side-effects due to cytochrome P450 enzyme deficiency  . Sertraline Hcl Other (See Comments)    Accumulates and causes stroke-like side-effects due to cytochrome P450 enzyme deficiency  . Tramadol Other (See Comments)    Accumulates and causes stroke-like side-effects due to cytochrome P450 enzyme deficiency  . Atorvastatin Other (See Comments)    MYALGIAS  . Colchicine Other (See Comments)    ataxia    Metabolic Disorder Labs: Lab Results  Component Value Date   HGBA1C 5.9 11/02/2018   MPG 131 10/21/2015   MPG 123 06/13/2015   No results found for: PROLACTIN Lab Results  Component Value Date   CHOL 129 11/02/2018   TRIG 97.0 11/02/2018   HDL 44.50 11/02/2018   CHOLHDL 3 11/02/2018   VLDL 19.4 11/02/2018   LDLCALC 65 11/02/2018   LDLCALC 45 10/31/2017   Lab Results  Component Value Date   TSH 4.75 (H) 11/02/2018   TSH 2.24 10/31/2017    Therapeutic Level Labs: No results found for: LITHIUM No results found for: VALPROATE No components found for:  CBMZ  Current Medications: Current Outpatient Medications  Medication Sig Dispense Refill  . allopurinol (ZYLOPRIM) 300 MG tablet TAKE 1 TABLET BY MOUTH EVERY DAY (Patient taking differently: Take 300 mg by mouth daily. ) 90 tablet 1  . amLODipine (NORVASC) 10 MG tablet TAKE 1 TABLET BY MOUTH EVERY DAY (Patient taking differently: Take 10 mg by mouth every  evening. ) 90 tablet 1  . Ascorbic Acid (VITAMIN C) 1000 MG tablet Take 1,000 mg by mouth 2 (two) times daily.     [name] aspirin 81 MG chewable tablet Chew 1 tablet (81 mg total) by mouth 2 (two) times daily. 35 tablet 0  . calcium carbonate (TUMS EX) 750 MG chewable tablet Chew 1 tablet by mouth daily as needed for heartburn.     . Carboxymethylcellulose Sodium (LUBRICANT EYE DROPS OP) Place 1 drop into both eyes 2 (two) times daily as needed (dry/irritated eyes.).     [name] Cholecalciferol (VITAMIN D-3) 125 MCG (5000 UT) TABS Take 5,000 Units by mouth 2 (two) times a day.    . famotidine (PEPCID) 40 MG tablet Take 1 tablet (40 mg total) by mouth daily. Annual appt due in May must see provider for future refills 30 tablet 0  . furosemide (LASIX) 40 MG tablet TAKE 1 TABLET BY MOUTH EVERY OTHER DAY 45 tablet 2  . levothyroxine (SYNTHROID) 25 MCG tablet Take 1 tablet (25 mcg total) by mouth daily before breakfast. Annual appt due in May must see provider for future refills 30 tablet 0  . nitroGLYCERIN (NITROSTAT) 0.4 MG SL tablet PLACE 1 TABLET UNDER THE TONGUE EVERY 5 MINUTES AS NEEDED FOR CHEST PAIN. (Patient taking differently: Place 0.4 mg under the tongue every 5 (five) minutes as needed for chest pain. ) 25 tablet 3  . primidone (MYSOLINE) 50 MG tablet Take 4 tablets (200 mg total) by mouth in the morning and at bedtime. 720 tablet 1  . rosuvastatin (CRESTOR) 10 MG tablet TAKE 1/2 TABLET BY MOUTH  EVERY DAY 45 tablet 1  . Turmeric 500 MG CAPS Take 500 mg by mouth 2 (two) times daily.     . vitamin B-12 (CYANOCOBALAMIN) 1000 MCG tablet Take 1,000 mcg by mouth 2 (two) times daily.      Current Facility-Administered Medications  Medication Dose Route Frequency Provider Last Rate Last Admin  . sodium chloride flush (NS) 0.9 % injection 3 mL  3 mL Intravenous Q12H Almyra Deforest, PA        Medication Side Effects: none  Orders placed this visit:  No orders of the defined types were placed in this  encounter.   Psychiatric Specialty Exam:  Review of Systems  Constitutional: Negative for appetite change and unexpected weight change.  HENT: Negative for congestion and ear pain.   Eyes: Negative for pain and visual disturbance.  Respiratory: Negative for chest tightness, shortness of breath and wheezing.   Cardiovascular: Negative for chest pain and palpitations.  Gastrointestinal: Negative for abdominal distention, diarrhea and nausea.  Endocrine: Negative for polyphagia.  Genitourinary: Negative for difficulty urinating and flank pain.  Musculoskeletal: Positive for arthralgias. Negative for back pain and neck pain.  Skin: Negative for rash.  Allergic/Immunologic: Negative for environmental allergies.  Neurological: Positive for tremors. Negative for dizziness, speech difficulty, weakness, light-headedness and headaches.    There were no vitals taken for this visit.There is no height or weight on file to calculate BMI.  General Appearance: Casual  Eye Contact:  Good  Speech:  Clear and Coherent and Normal Rate  Volume:  Normal  Mood:  Euthymic  Affect:  Appropriate  Thought Process:  Coherent and Goal Directed  Orientation:  Full (Time, Place, and Person)  Thought Content: Logical   Suicidal Thoughts:  No  Homicidal Thoughts:  No  Memory:  WNL  Judgement:  Good  Insight:  Good  Psychomotor Activity:  Normal and Tremor  Concentration:  Concentration: Good  Recall:  Good  Fund of Knowledge: Good  Language: Good  Assets:  Agricultural consultant Housing Leisure Time Social Support Talents/Skills Transportation Vocational/Educational  ADL's:  Intact  Cognition: WNL  Prognosis:  Good   Screenings:  GAD-7     Office Visit from 10/26/2016 in [facility]  Total GAD-7 Score 8    PHQ2-9     Office Visit from 11/26/2019 in [facility] from 04/01/2019 in [facility] Visit from 11/02/2018 in [facility] from 10/27/2017 in [facility] from 10/26/2016 in [facility]  PHQ-2 Total Score 0 0 1 1 0  PHQ-9 Total Score -- -- -- -- 3      Receiving Psychotherapy: No   Treatment Plan/Recommendations:  Patient was referred for depression.  He admits he had some depressive symptoms and reaction to the restrictions associated with Covid.  He is an outgoing active person and being restricted to home for an extended period had a negative effect on his mood.  However that has resolved.  He has remote history of depression 20 years ago for which she was treated briefly but has not had treatment since then.  He is not currently depressed nor anxious.  There are no psychiatric symptoms that need treatment.  He agrees with that assessment.  He does have some chronic insomnia symptoms but that is manageable with his current schedule.  Follow-up as needed  Lynder Parents MD, DFAPA  Purnell Shoemaker, MD

## 2019-12-05 LAB — HEPATITIS C ANTIBODY
Hepatitis C Ab: NONREACTIVE
SIGNAL TO CUT-OFF: 0.13 (ref <1.00)

## 2019-12-11 ENCOUNTER — Other Ambulatory Visit: Payer: Self-pay | Admitting: Internal Medicine

## 2019-12-11 NOTE — Telephone Encounter (Signed)
Please refill as per office routine med refill policy (all routine meds refilled for 3 mo or monthly per pt preference up to one year from last visit, then month to month grace period for 3 mo, then further med refills will have to be denied)  

## 2019-12-12 ENCOUNTER — Telehealth: Payer: Self-pay | Admitting: Cardiology

## 2019-12-12 NOTE — Progress Notes (Signed)
Assessment/Plan:    1.  Essential Tremor  -Tremor not well controlled, but do not think increasing primidone will help but he would like to try (primidone been shown to be helpful up to 500 mg and he is at 400 mg).  He is limited on further medications.  He denied any mental health issues to psychiatry, but this is certainly still a real concern of mine given issues that I have witnessed within the office.  We discussed this in detail today.  Also discussed that although generally I consider DBS to be first line, in him, focused ultrasound may be a better option given that its only 1 surgery and his issues with anesthesia with the P450 problems.  -increase primidone to 250 mg bid.  R/B/SE were discussed.  The opportunity to ask questions was given and they were answered to the best of my ability.  The patient expressed understanding and willingness to follow the outlined treatment protocols.  2.  Bilateral carotid bruits, L>R  -we will do carotid u/s.    Subjective:   Troy Banks was seen today in follow up for essential tremor.  My previous records were reviewed prior to todays visit.  Patient has seen psychiatry since our last visit, but denied depression to psychiatry (which was a concern for DBS).  Psychiatry also notes in their notes that wife had no concerns about mental health.  Wife reports that tremor is worse.  No falls but wife states that he has had near falls.   Had L hip surgery in March and still having some pain after that.  Did PT after that.    Current prescribed movement disorder medications: primidone, 50 mg, 4 po bid   PREVIOUS MEDICATIONS: Not beta-blocker candidate because of bradycardia; not topiramate candidate because of history of nephrolithiasis.  ALLERGIES:   Allergies  Allergen Reactions  . Acyclovir And Related Other (See Comments)    Accumulates and causes stroke-like side-effects due to cytochrome P450 enzyme deficiency  . Benzodiazepines Other  (See Comments)    Patient states he gets stroke symptoms with benzo's.   . Compazine [Prochlorperazine Maleate] Other (See Comments)    Accumulates and causes stroke-like side-effects due to cytochrome P450 enzyme deficiency  . Coreg [Carvedilol] Other (See Comments)    Accumulates and causes stroke-like side-effects due to cytochrome P450 enzyme deficiency Patient poor metabolizer of CYP2D6 - Coreg undergoes extensive hepatic (including 2D6)  . Flomax [Tamsulosin Hcl] Other (See Comments)    Accumulates and causes stroke-like side-effects due to cytochrome P450 enzyme deficiency  . Lisinopril Other (See Comments)    Hyperkalemia. 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  . Mobic [Meloxicam] Other (See Comments)    Accumulates and causes stroke-like side-effects due to cytochrome P450 enzyme deficiency  . Ondansetron Other (See Comments)    Accumulates and causes stroke-like side-effects due to cytochrome P450 enzyme deficiency   . Oxycodone Other (See Comments)    Accumulates and causes stroke-like side-effects due to cytochrome P450 enzyme deficiency  . Promethazine Other (See Comments)    Accumulates and causes stroke-like side-effects due to cytochrome P450 enzyme deficiency  . Sertraline Hcl Other (See Comments)    Accumulates and causes stroke-like side-effects due to cytochrome P450 enzyme deficiency  . Tramadol Other (See Comments)    Accumulates and causes stroke-like side-effects due to cytochrome P450 enzyme deficiency  . Atorvastatin Other (See Comments)    MYALGIAS  . Colchicine Other (See Comments)  ataxia    CURRENT MEDICATIONS:  Outpatient Encounter Medications as of 12/17/2019  Medication Sig  . allopurinol (ZYLOPRIM) 300 MG tablet TAKE 1 TABLET BY MOUTH EVERY DAY  . amLODipine (NORVASC) 10 MG tablet TAKE 1 TABLET BY MOUTH EVERY DAY  . Ascorbic Acid (VITAMIN C) 1000 MG tablet Take 1,000 mg by mouth 2 (two) times  daily.   [name] aspirin 81 MG chewable tablet Chew 1 tablet (81 mg total) by mouth 2 (two) times daily. (Patient taking differently: Chew 81 mg by mouth daily. )  . calcium carbonate (TUMS EX) 750 MG chewable tablet Chew 1 tablet by mouth daily as needed for heartburn.   . Carboxymethylcellulose Sodium (LUBRICANT EYE DROPS OP) Place 1 drop into both eyes 2 (two) times daily as needed (dry/irritated eyes.).   [name] Cholecalciferol (VITAMIN D-3) 125 MCG (5000 UT) TABS Take 5,000 Units by mouth 2 (two) times a day.  . famotidine (PEPCID) 40 MG tablet TAKE 1 TABLET BY MOUTH EVERY DAY. {Annual appointment due in MAY. Must see provider FOR future refills  . furosemide (LASIX) 40 MG tablet TAKE 1 TABLET BY MOUTH EVERY OTHER DAY  . levothyroxine (SYNTHROID) 25 MCG tablet TAKE 1 TABLET BY MOUTH EVERY DAY BEFORE BREAKFAST  . nitroGLYCERIN (NITROSTAT) 0.4 MG SL tablet PLACE 1 TABLET UNDER THE TONGUE EVERY 5 MINUTES AS NEEDED FOR CHEST PAIN. (Patient taking differently: Place 0.4 mg under the tongue every 5 (five) minutes as needed for chest pain. )  . primidone (MYSOLINE) 50 MG tablet Take 4 tablets (200 mg total) by mouth in the morning and at bedtime.  . rosuvastatin (CRESTOR) 10 MG tablet TAKE 1/2 TABLET BY MOUTH EVERY DAY  . Turmeric 500 MG CAPS Take 500 mg by mouth daily.   . vitamin B-12 (CYANOCOBALAMIN) 1000 MCG tablet Take 1,000 mcg by mouth daily.   . [DISCONTINUED] allopurinol (ZYLOPRIM) 300 MG tablet TAKE 1 TABLET BY MOUTH EVERY DAY (Patient taking differently: Take 300 mg by mouth daily. )  . [DISCONTINUED] famotidine (PEPCID) 40 MG tablet Take 1 tablet (40 mg total) by mouth daily. Annual appt due in May must see provider for future refills  . [DISCONTINUED] ranitidine (ZANTAC) 150 MG tablet TAKE 1 TABLET BY MOUTH 2 TIMES DAILY (Patient taking differently: Take 150 mg by mouth 2 (two) times a day. )   Facility-Administered Encounter Medications as of 12/17/2019  Medication  . sodium chloride flush (NS)  0.9 % injection 3 mL     Objective:    PHYSICAL EXAMINATION:    VITALS:   Vitals:   12/17/19 0810  BP: (!) 171/75  Pulse: (!) 48  SpO2: 98%  Weight: 214 lb (97.1 kg)  Height: 5\' 8"  (1.727 m)    GEN:  The patient appears stated age and is in NAD. HEENT:  Normocephalic, atraumatic.  The mucous membranes are moist. The superficial temporal arteries are without ropiness or tenderness. CV:  RRR Lungs:  CTAB Neck/HEME:  There are L>R carotid bruit  Neurological examination:  Orientation: The patient is alert and oriented x3. Cranial nerves: There is good facial symmetry. The speech is fluent and clear. Soft palate rises symmetrically and there is no tongue deviation. Hearing is intact to conversational tone. Sensation: Sensation is intact to light touch throughout Motor: Strength is at least antigravity x4.  Movement examination: Tone: There is normal tone in the UE/LE Abnormal movements: there is postural tremor, R>L.  There is head tremor in the "yes" direction. Coordination:  There is no  decremation with RAM's Gait and Station: The patient has no difficulty arising out of a deep-seated chair without the use of the hands. The patient's gait is antalgic and drags the L leg. I have reviewed and interpreted the following labs independently   Chemistry      Component Value Date/Time   NA 140 12/04/2019 1130   NA 143 10/19/2018 1411   K 4.0 12/04/2019 1130   CL 105 12/04/2019 1130   CO2 27 12/04/2019 1130   BUN 20 12/04/2019 1130   BUN 17 10/19/2018 1411   CREATININE 0.95 12/04/2019 1130   CREATININE 0.94 03/04/2016 0918      Component Value Date/Time   CALCIUM 9.3 12/04/2019 1130   ALKPHOS 58 12/04/2019 1130   AST 19 12/04/2019 1130   ALT 22 12/04/2019 1130   BILITOT 0.8 12/04/2019 1130      Lab Results  Component Value Date   WBC 5.4 12/04/2019   HGB 15.1 12/04/2019   HCT 44.5 12/04/2019   MCV 89.4 12/04/2019   PLT 126.0 (L) 12/04/2019   Lab Results    Component Value Date   TSH 2.42 12/04/2019     Chemistry      Component Value Date/Time   NA 140 12/04/2019 1130   NA 143 10/19/2018 1411   K 4.0 12/04/2019 1130   CL 105 12/04/2019 1130   CO2 27 12/04/2019 1130   BUN 20 12/04/2019 1130   BUN 17 10/19/2018 1411   CREATININE 0.95 12/04/2019 1130   CREATININE 0.94 03/04/2016 0918      Component Value Date/Time   CALCIUM 9.3 12/04/2019 1130   ALKPHOS 58 12/04/2019 1130   AST 19 12/04/2019 1130   ALT 22 12/04/2019 1130   BILITOT 0.8 12/04/2019 1130         Total time spent on today's visit was 45 minutes, including both face-to-face time and nonface-to-face time.  Time included that spent on review of records (prior notes available to me/labs/imaging if pertinent), discussing treatment and goals, answering patient's questions and coordinating care.  Cc:  [name], MD

## 2019-12-12 NOTE — Telephone Encounter (Signed)
LVM for patient to return call to get follow up appointment scheduled from patients recall lists

## 2019-12-14 ENCOUNTER — Other Ambulatory Visit: Payer: Self-pay | Admitting: Internal Medicine

## 2019-12-14 NOTE — Telephone Encounter (Signed)
Please refill as per office routine med refill policy (all routine meds refilled for 3 mo or monthly per pt preference up to one year from last visit, then month to month grace period for 3 mo, then further med refills will have to be denied)  

## 2019-12-16 ENCOUNTER — Other Ambulatory Visit: Payer: Self-pay | Admitting: Internal Medicine

## 2019-12-16 DIAGNOSIS — H04123 Dry eye syndrome of bilateral lacrimal glands: Secondary | ICD-10-CM | POA: Diagnosis not present

## 2019-12-16 DIAGNOSIS — H43813 Vitreous degeneration, bilateral: Secondary | ICD-10-CM | POA: Diagnosis not present

## 2019-12-16 DIAGNOSIS — H25813 Combined forms of age-related cataract, bilateral: Secondary | ICD-10-CM | POA: Diagnosis not present

## 2019-12-16 DIAGNOSIS — H353131 Nonexudative age-related macular degeneration, bilateral, early dry stage: Secondary | ICD-10-CM | POA: Diagnosis not present

## 2019-12-16 NOTE — Telephone Encounter (Signed)
Please refill as per office routine med refill policy (all routine meds refilled for 3 mo or monthly per pt preference up to one year from last visit, then month to month grace period for 3 mo, then further med refills will have to be denied)  

## 2019-12-17 ENCOUNTER — Other Ambulatory Visit: Payer: Self-pay

## 2019-12-17 ENCOUNTER — Ambulatory Visit: Payer: Medicare Other | Admitting: Neurology

## 2019-12-17 ENCOUNTER — Encounter: Payer: Self-pay | Admitting: Neurology

## 2019-12-17 VITALS — BP 171/75 | HR 48 | Ht 68.0 in | Wt 214.0 lb

## 2019-12-17 DIAGNOSIS — G250 Essential tremor: Secondary | ICD-10-CM

## 2019-12-17 DIAGNOSIS — R0989 Other specified symptoms and signs involving the circulatory and respiratory systems: Secondary | ICD-10-CM | POA: Diagnosis not present

## 2019-12-17 MED ORDER — PRIMIDONE 250 MG PO TABS: 250.0000 mg | ORAL_TABLET | Freq: Two times a day (BID) | ORAL | 1 refills | Status: DC

## 2019-12-17 NOTE — Patient Instructions (Addendum)
1.  Week 1:  Take primidone 50 mg, 4 in the AM and 5 at night 2.  Week 2 and thereafter: take primidone 250 mg twice per day  I will get your carotid ultrasound scheduled.

## 2019-12-18 ENCOUNTER — Telehealth: Payer: Self-pay | Admitting: Neurology

## 2019-12-18 NOTE — Telephone Encounter (Signed)
Long talk with pts daughter with patients permission (patient gave verbal permission and on DPR).  Daughter is an FP.  Discussed issue associated with mood and that DBS can worsen this.  Discussed that I was not sure that he was honest with the psychiatrist.  Discussed that, if anything, I thought that focused ultrasound may be a better option for the patient.  Discussed that I thought patient was frustrated over our conversations regarding this.  Patient's daughter stated that he has a lifelong history of what is probably bipolar disorder (stated that he had testing in the past regarding this).  Discussed that patients who have mood disorder can have DBS, but it needs to be well controlled and I was just concerned that his is not.  She expressed appreciation for the phone call and understanding.  She feels that patient has mostly been honest with her in the conversations that they have had, but she has not yet spoken to him about our visit the other day.

## 2019-12-19 ENCOUNTER — Other Ambulatory Visit: Payer: Self-pay

## 2019-12-19 ENCOUNTER — Ambulatory Visit (HOSPITAL_COMMUNITY)
Admission: RE | Admit: 2019-12-19 | Discharge: 2019-12-19 | Disposition: A | Discharge status: Home / Self Care | Payer: Medicare Other | Source: Ambulatory Visit | Attending: Cardiovascular Disease | Admitting: Cardiovascular Disease

## 2019-12-19 DIAGNOSIS — R0989 Other specified symptoms and signs involving the circulatory and respiratory systems: Secondary | ICD-10-CM | POA: Insufficient documentation

## 2020-01-06 ENCOUNTER — Other Ambulatory Visit: Payer: Self-pay | Admitting: Internal Medicine

## 2020-01-06 ENCOUNTER — Other Ambulatory Visit: Payer: Self-pay | Admitting: Cardiology

## 2020-01-06 NOTE — Telephone Encounter (Signed)
Please refill as per office routine med refill policy (all routine meds refilled for 3 mo or monthly per pt preference up to one year from last visit, then month to month grace period for 3 mo, then further med refills will have to be denied)  

## 2020-01-22 ENCOUNTER — Ambulatory Visit: Payer: Medicare Other | Admitting: Podiatry

## 2020-01-22 ENCOUNTER — Other Ambulatory Visit: Payer: Self-pay

## 2020-01-22 ENCOUNTER — Encounter: Payer: Self-pay | Admitting: Podiatry

## 2020-01-22 DIAGNOSIS — M25571 Pain in right ankle and joints of right foot: Secondary | ICD-10-CM

## 2020-01-22 DIAGNOSIS — M25579 Pain in unspecified ankle and joints of unspecified foot: Secondary | ICD-10-CM | POA: Insufficient documentation

## 2020-01-22 DIAGNOSIS — B351 Tinea unguium: Secondary | ICD-10-CM | POA: Diagnosis not present

## 2020-01-22 DIAGNOSIS — E119 Type 2 diabetes mellitus without complications: Secondary | ICD-10-CM | POA: Diagnosis not present

## 2020-01-22 DIAGNOSIS — G8929 Other chronic pain: Secondary | ICD-10-CM

## 2020-01-22 DIAGNOSIS — M79674 Pain in right toe(s): Secondary | ICD-10-CM | POA: Diagnosis not present

## 2020-01-22 DIAGNOSIS — M79675 Pain in left toe(s): Secondary | ICD-10-CM | POA: Diagnosis not present

## 2020-01-22 NOTE — Progress Notes (Signed)
This patient returns to the office for evaluation and treatment of long thick painful nails .  This patient is unable to trim his own nails since the patient cannot reach the feet.  Patient says the nails are painful walking and wearing his shoes.  He returns for preventive foot care services.    General Appearance  Alert, conversant and in no acute stress.  Vascular  Dorsalis pedis and posterior tibial  pulses are palpable  bilaterally.  Capillary return is within normal limits  bilaterally. Temperature is within normal limits  bilaterally.  Neurologic  Senn-Weinstein monofilament wire test within normal limits  bilaterally. Muscle power within normal limits bilaterally.  Nails Thick disfigured discolored nails with subungual debris  from hallux to fifth toes bilaterally. No evidence of bacterial infection or drainage bilaterally.  Orthopedic  No limitations of motion  feet .  No crepitus or effusions noted.  No bony pathology or digital deformities noted.  Ankle/STJ immobile left foot/ankle.  Skin  normotropic skin with no porokeratosis noted bilaterally.  No signs of infections or ulcers noted.     Onychomycosis  Pain in toes right foot  Pain in toes left foot  Debridement  of nails  1-5  B/L with a nail nipper.  Nails were then filed using a dremel tool with no incidents.    RTC  4 month   Gardiner Barefoot DPM

## 2020-02-03 ENCOUNTER — Other Ambulatory Visit: Payer: Self-pay | Admitting: Internal Medicine

## 2020-02-03 NOTE — Telephone Encounter (Signed)
Please refill as per office routine med refill policy (all routine meds refilled for 3 mo or monthly per pt preference up to one year from last visit, then month to month grace period for 3 mo, then further med refills will have to be denied)  

## 2020-02-04 ENCOUNTER — Other Ambulatory Visit: Payer: Self-pay | Admitting: Internal Medicine

## 2020-02-04 NOTE — Telephone Encounter (Signed)
Please refill as per office routine med refill policy (all routine meds refilled for 3 mo or monthly per pt preference up to one year from last visit, then month to month grace period for 3 mo, then further med refills will have to be denied)  

## 2020-02-06 NOTE — Progress Notes (Signed)
02/07/2020 KRAVEN CALK   1940/10/28  [id number]  Primary Physician [name], MD Primary Cardiologist: Dr Cailynn Bodnar [name]  HPI:  79 y.o. Male seen for follow up CAD. He was admitted 10/19-10/31/16 with a non-STEMI. LHC demonstrated 3 vessel CAD. He also had significant aortic stenosis.  He underwent CABG with LIMA-LAD, SVG-intermediate, SVG-LCx, SVG-distal RCA and bioprosthetic AVR. Atenolol was initiated but later stopped secondary to bradycardia. He did develop atrial fibrillation. He was placed on amiodarone with return to NSR.  He has an essential tremor and is on Primidone chronically. 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 Amiodarone was stopped in January 2017 at the time he was hospitalized for an acute gout attack in his [place]. He has maintained NSR.  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 He has a daughter who is a family physician in [place].  In May 2018 he complained of more dyspnea on exertion and some chest tightness.  CXR was clear. Myoview study was normal.  In June 2018 he underwent cystoscopy and lithotripsy for renal stones.   In September 2018 he fell off his roof fracturing his left ankle and had a compression fracture of L2. He required extensive surgery of his ankle.   In May 2020 he presented with symptoms of increased DOE. He underwent cardiac cath showing all grafts were patent. Echo showed normal EF with evidence of diastolic dysfunction. The AV prothesis function had not changed. He had mild pulmonary HTN. His diuretic dose was increased.  He did undergo left THR in March. Carotid dopplers in July looked good.   On follow up today he is doing well. He does have white coat syndrome. Usually BP at home is 130-140/70-80. His wife confirms this. His breathing is doing well. He has had 2 episodes of chest pain. One probably related to heat stress and relieved with sl Ntg x 2. Another 6 weeks  later relieved with 1 sl Ntg.      Current Outpatient Medications  Medication Sig Dispense Refill  . allopurinol (ZYLOPRIM) 300 MG tablet TAKE 1 TABLET BY MOUTH EVERY DAY 90 tablet 1  . amLODipine (NORVASC) 10 MG tablet TAKE 1 TABLET BY MOUTH EVERY DAY 90 tablet 1  . Ascorbic Acid (VITAMIN C) 1000 MG tablet Take 1,000 mg by mouth 2 (two) times daily.     [name] aspirin EC 81 MG tablet Take 81 mg by mouth daily. Swallow whole.    . calcium carbonate (TUMS EX) 750 MG chewable tablet Chew 1 tablet by mouth daily as needed for heartburn.     . Carboxymethylcellulose Sodium (LUBRICANT EYE DROPS OP) Place 1 drop into both eyes 2 (two) times daily as needed (dry/irritated eyes.).     [name] Cholecalciferol (VITAMIN D-3) 125 MCG (5000 UT) TABS Take 5,000 Units by mouth 2 (two) times a day.    . famotidine (PEPCID) 40 MG tablet TAKE 1 TABLET BY MOUTH EVERY DAY. MUST SEE PROVIDER FOR FUTURE REFILLS 90 tablet 2  . furosemide (LASIX) 40 MG tablet TAKE 1 TABLET BY MOUTH EVERY OTHER DAY 45 tablet 2  . levothyroxine (SYNTHROID) 25 MCG tablet TAKE 1 TABLET BY MOUTH EVERY DAY BEFORE BREAKFAST 30 tablet 0  . nitroGLYCERIN (NITROSTAT) 0.4 MG SL tablet PLACE 1 TABLET UNDER THE TONGUE EVERY 5 MINUTES AS NEEDED FOR CHEST PAIN. (Patient taking differently: Place 0.4 mg under the tongue every 5 (five) minutes as needed for  chest pain. ) 25 tablet 3  . primidone (MYSOLINE) 250 MG tablet Take 1 tablet (250 mg total) by mouth in the morning and at bedtime. 180 tablet 1  . rosuvastatin (CRESTOR) 10 MG tablet TAKE 1/2 TABLET BY MOUTH EVERY DAY 45 tablet 1  . Turmeric 500 MG CAPS Take 500 mg by mouth daily.     . vitamin B-12 (CYANOCOBALAMIN) 1000 MCG tablet Take 1,000 mcg by mouth daily.      Current Facility-Administered Medications  Medication Dose Route Frequency Provider Last Rate Last Admin  . sodium chloride flush (NS) 0.9 % injection 3 mL  3 mL Intravenous Q12H Almyra Deforest, PA        Allergies  Allergen Reactions  .  Acyclovir And Related Other (See Comments)    Accumulates and causes stroke-like side-effects due to cytochrome P450 enzyme deficiency  . Benzodiazepines Other (See Comments)    Patient states he gets stroke symptoms with benzo's.   . Compazine [Prochlorperazine Maleate] Other (See Comments)    Accumulates and causes stroke-like side-effects due to cytochrome P450 enzyme deficiency  . Coreg [Carvedilol] Other (See Comments)    Accumulates and causes stroke-like side-effects due to cytochrome P450 enzyme deficiency Patient poor metabolizer of CYP2D6 - Coreg undergoes extensive hepatic (including 2D6)  . Flomax [Tamsulosin Hcl] Other (See Comments)    Accumulates and causes stroke-like side-effects due to cytochrome P450 enzyme deficiency  . Lisinopril Other (See Comments)    Hyperkalemia. 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  . Mobic [Meloxicam] Other (See Comments)    Accumulates and causes stroke-like side-effects due to cytochrome P450 enzyme deficiency  . Ondansetron Other (See Comments)    Accumulates and causes stroke-like side-effects due to cytochrome P450 enzyme deficiency   . Oxycodone Other (See Comments)    Accumulates and causes stroke-like side-effects due to cytochrome P450 enzyme deficiency  . Promethazine Other (See Comments)    Accumulates and causes stroke-like side-effects due to cytochrome P450 enzyme deficiency  . Sertraline Hcl Other (See Comments)    Accumulates and causes stroke-like side-effects due to cytochrome P450 enzyme deficiency  . Tramadol Other (See Comments)    Accumulates and causes stroke-like side-effects due to cytochrome P450 enzyme deficiency  . Atorvastatin Other (See Comments)    MYALGIAS  . Colchicine Other (See Comments)    ataxia    Social History   Socioeconomic History  . Marital status: Married    Spouse name: Karmen Bongo, RN  . Number of children: 2  . Years of education:  Not on file  . Highest education level: Some college, no degree  Occupational History  . Occupation: Retired Animal nutritionist  Tobacco Use  . Smoking status: Former Smoker    Packs/day: 1.00    Years: 34.00    Pack years: 34.00    Types: Cigarettes    Quit date: 06/06/1990    Years since quitting: 29.6  . Smokeless tobacco: Never Used  Vaping Use  . Vaping Use: Never used  Substance and Sexual Activity  . Alcohol use: Yes    Alcohol/week: 3.0 standard drinks    Types: 1 Cans of beer, 2 Shots of liquor per week  . Drug use: No  . Sexual activity: Yes  Other Topics Concern  . Not on file  Social History Narrative  . Not on file   Social Determinants of Health   Financial Resource Strain: Low Risk   . Difficulty of Paying Living Expenses:  Not hard at all  Food Insecurity: No Food Insecurity  . Worried About Charity fundraiser in the Last Year: Never true  . Ran Out of Food in the Last Year: Never true  Transportation Needs: Unmet Transportation Needs  . Lack of Transportation (Medical): Yes  . Lack of Transportation (Non-Medical): Yes  Physical Activity:   . Days of Exercise per Week: Not on file  . Minutes of Exercise per Session: Not on file  Stress: No Stress Concern Present  . Feeling of Stress : Not at all  Social Connections:   . Frequency of Communication with Friends and Family: Not on file  . Frequency of Social Gatherings with Friends and Family: Not on file  . Attends Religious Services: Not on file  . Active Member of Clubs or Organizations: Not on file  . Attends Archivist Meetings: Not on file  . Marital Status: Not on file  Intimate Partner Violence:   . Fear of Current or Ex-Partner: Not on file  . Emotionally Abused: Not on file  . Physically Abused: Not on file  . Sexually Abused: Not on file     Review of Systems: As noted in HPI All other systems reviewed and are otherwise negative except as noted above.    Blood pressure  (!) 182/78, pulse (!) 54, height 5\' 8"  (1.727 m), weight 208 lb 4 oz (94.5 kg).  GENERAL:  Well appearing WM in NAD HEENT:  PERRL, EOMI, sclera are clear. Oropharynx is clear. NECK:  No jugular venous distention, carotid upstroke brisk and symmetric, no bruits, no thyromegaly or adenopathy LUNGS:  Clear to auscultation bilaterally CHEST:  Unremarkable, old sternotomy scar HEART:  RRR,  PMI not displaced or sustained,S1 and S2 within normal limits, no S3, no S4: no clicks, no rubs, gr 2/6 systolic murmur RUSB.  ABD:  Soft, nontender. BS +, no masses or bruits. No hepatomegaly, no splenomegaly EXT:  2 + pulses throughout, Tr edema on left- left calf is larger than right., no cyanosis no clubbing SKIN:  Warm and dry.  No rashes NEURO:  Alert and oriented x 3. Cranial nerves II through XII intact. PSYCH:  Cognitively intact    Lab Results  Component Value Date   WBC 5.4 12/04/2019   HGB 15.1 12/04/2019   HCT 44.5 12/04/2019   PLT 126.0 (L) 12/04/2019   GLUCOSE 122 (H) 12/04/2019   CHOL 130 12/04/2019   TRIG 285.0 (H) 12/04/2019   HDL 41.00 12/04/2019   LDLDIRECT 63.0 12/04/2019   LDLCALC 65 11/02/2018   ALT 22 12/04/2019   AST 19 12/04/2019   NA 140 12/04/2019   K 4.0 12/04/2019   CL 105 12/04/2019   CREATININE 0.95 12/04/2019   BUN 20 12/04/2019   CO2 27 12/04/2019   TSH 2.42 12/04/2019   PSA 1.76 12/04/2019   INR 1.47 03/30/2015   HGBA1C 6.0 12/04/2019   MICROALBUR 4.3 (H) 10/31/2017   Ecg today shows sinus brady rate 54. First degree AV block. Nonspecific ST- T wave changes.no change from prior.  I have personally reviewed and interpreted this study.   Echo 04/24/15: Study Conclusions  - Left ventricle: The cavity size was normal. Wall thickness was   increased in a pattern of mild LVH. Systolic function was normal.   The estimated ejection fraction was in the range of 55% to 60%.   Wall motion was normal; there were no regional wall motion   abnormalities. Doppler  parameters are consistent with  abnormal   left ventricular relaxation (grade 1 diastolic dysfunction). - Aortic valve: A bioprosthesis was present. There was trivial   regurgitation. - Left atrium: The atrium was mildly dilated. - Atrial septum: There was an atrial septal aneurysm. - Pericardium, extracardiac: A small pericardial effusion was   identified.  Impressions:  - Normal LV systolic function; grade 1 diastolic dysfunction; mild   LVH; s/p AVR with trace AI and mean gradient of 18 mmHg; mild   LAE; trace MR.  Myoview 10/22/16: Study Highlights     The left ventricular ejection fraction is mildly decreased (45-54%).  Nuclear stress EF: 53%.  No T wave inversion was noted during stress.  There was no ST segment deviation noted during stress.  This is a low risk study.   No reversible ischemia. LVEF 53% with normal wall motion. This is a low risk study.    Cardiac cath: 10/25/18:  LEFT HEART CATH AND CORS/GRAFTS ANGIOGRAPHY  Conclusion    Ost LAD to Prox LAD lesion is 80% stenosed. Mid LAD lesion is 100% stenosed -after second septal branch which coursed is a tandem LAD  LIMA-dLAD graft was visualized by angiography and is normal in caliber. The graft exhibits no disease.  Ost Ramus-1 lesion is 90% stenosed. Ost Ramus-2 lesion is 60% stenosed (shortly after lateral branch).  SVG-Ramus graft was visualized by angiography and is large. The graft exhibits minimal luminal irregularities.  Ost Cx lesion is 80% stenosed. Prox Cx to Mid Cx lesion is 80% stenosed. Mid Cx lesion is 60% stenosed.  SVG-dCx graft was visualized by angiography and is large. The graft exhibits minimal luminal irregularities. Retrograde filling of OM 2 and AV groove circumflex  Prox RCA to Mid RCA lesion is 100% stenosed. This is progression from 99% stenosis pre-CABG.  SVG-dRCA graft was visualized by angiography and is large. The graft exhibits no disease.  Unable to cross aortic  valve prosthesis.   SUMMARY  Severe native CAD essentially unchanged from post CABG with exception of now 100% occluded RCA.  4 out of 4 patent grafts: LIMA-LAD, SVG-RI (D1), SVG-dCx, SVG-D RCA  Systemic Hypertension  Angiographically small aortic root/stent prosthetic valve.  Unable to cross.   RECOMMENDATIONS  Discharge home after bedrest and hydration  Consider 2D echocardiogram to assess for restenosis of aortic valve  Pending echocardiographic evaluation, would consider non-anginal etiology -> with hypertension, could be related to diastolic dysfunction.   Glenetta Hew, MD   Echo 11/20/18:IMPRESSIONS    1. A 53mm an Edwards porcine bioprosthesis valve is present in the aortic position. Procedure Date: 03/30/15 Echo findings shows no evidence of regurgitation, perivalvular leak, rocking or dehiscence of the aortic prosthesis. Bioprosthetic leaflets not  well seen. Mean systolic gradient is 19 mmHg, no significant difference from gradient reported 1 month post operative in 04/2015.  2. The left ventricle has normal systolic function with an ejection fraction of 60-65%. The cavity size was normal. There is mildly increased left ventricular wall thickness. Left ventricular diastolic Doppler parameters are consistent with impaired  relaxation due to nondiagnostic images. Elevated left ventricular end-diastolic pressure The E/e' is 20.  3. The right ventricle has normal systolic function. The cavity was normal. There is no increase in right ventricular wall thickness. Right ventricular systolic pressure is mildly elevated with an estimated pressure of 35.9 mmHg.  4. Left atrial size was mild-moderately dilated.  5. There is mild mitral annular calcification present.  6. There is mild dilatation of the ascending aorta measuring 40  mm.    ASSESSMENT AND PLAN:  1.  CAD s/p CABG in October 2016 following NSTEMI.   Myoview negative in May 2018. Cardiac cath in May 2020 showed  all grafts were patent. Infrequent chest pain relieved with sl Ntg.  Continue statin.Not on beta blocker due to bradycardia.   2. S/p bioprosthetic AVR for aortic stenosis. On routine SBE prophylaxis.  Echo June 2020 unchanged from initial post op Echo.  3.  Hyperlipidemia. Satisfactory on Crestor 5 mg daily. Elevated triglycerides on last lab probably not fasting. He has lost weight.   4. HTN. With white coat syndrome. BP at home acceptable. Continue same meds.   5. 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 We did discuss the COVID 19 vaccine. I think he should get it. There is nothing in the vaccine that would require the P450 pathway.     Jerra Huckeby [name] MD,FACC 02/07/2020 2:44 PM

## 2020-02-07 ENCOUNTER — Ambulatory Visit: Payer: Medicare Other | Admitting: Cardiology

## 2020-02-07 ENCOUNTER — Encounter: Payer: Self-pay | Admitting: Cardiology

## 2020-02-07 ENCOUNTER — Other Ambulatory Visit: Payer: Self-pay

## 2020-02-07 VITALS — BP 182/78 | HR 54 | Ht 68.0 in | Wt 208.2 lb

## 2020-02-07 DIAGNOSIS — I25810 Atherosclerosis of coronary artery bypass graft(s) without angina pectoris: Secondary | ICD-10-CM | POA: Diagnosis not present

## 2020-02-07 DIAGNOSIS — E785 Hyperlipidemia, unspecified: Secondary | ICD-10-CM | POA: Diagnosis not present

## 2020-02-07 DIAGNOSIS — Z952 Presence of prosthetic heart valve: Secondary | ICD-10-CM

## 2020-02-07 DIAGNOSIS — I5032 Chronic diastolic (congestive) heart failure: Secondary | ICD-10-CM | POA: Diagnosis not present

## 2020-02-07 DIAGNOSIS — I10 Essential (primary) hypertension: Secondary | ICD-10-CM

## 2020-02-14 DIAGNOSIS — B078 Other viral warts: Secondary | ICD-10-CM | POA: Diagnosis not present

## 2020-02-14 DIAGNOSIS — L821 Other seborrheic keratosis: Secondary | ICD-10-CM | POA: Diagnosis not present

## 2020-02-14 DIAGNOSIS — L738 Other specified follicular disorders: Secondary | ICD-10-CM | POA: Diagnosis not present

## 2020-02-14 DIAGNOSIS — L280 Lichen simplex chronicus: Secondary | ICD-10-CM | POA: Diagnosis not present

## 2020-02-14 DIAGNOSIS — L245 Irritant contact dermatitis due to other chemical products: Secondary | ICD-10-CM | POA: Diagnosis not present

## 2020-02-18 ENCOUNTER — Other Ambulatory Visit: Payer: Self-pay

## 2020-02-18 DIAGNOSIS — G250 Essential tremor: Secondary | ICD-10-CM

## 2020-02-26 ENCOUNTER — Other Ambulatory Visit: Payer: Self-pay | Admitting: Cardiology

## 2020-03-03 ENCOUNTER — Telehealth: Payer: Medicare Other | Admitting: Family Medicine

## 2020-03-05 ENCOUNTER — Other Ambulatory Visit: Payer: Self-pay | Admitting: Internal Medicine

## 2020-03-05 NOTE — Telephone Encounter (Signed)
Please refill as per office routine med refill policy (all routine meds refilled for 3 mo or monthly per pt preference up to one year from last visit, then month to month grace period for 3 mo, then further med refills will have to be denied)  

## 2020-03-16 ENCOUNTER — Telehealth (INDEPENDENT_AMBULATORY_CARE_PROVIDER_SITE_OTHER): Payer: Medicare Other | Admitting: Internal Medicine

## 2020-03-16 DIAGNOSIS — E119 Type 2 diabetes mellitus without complications: Secondary | ICD-10-CM

## 2020-03-16 DIAGNOSIS — G4489 Other headache syndrome: Secondary | ICD-10-CM

## 2020-03-16 DIAGNOSIS — I10 Essential (primary) hypertension: Secondary | ICD-10-CM | POA: Diagnosis not present

## 2020-03-16 DIAGNOSIS — R519 Headache, unspecified: Secondary | ICD-10-CM | POA: Insufficient documentation

## 2020-03-16 NOTE — Progress Notes (Signed)
Patient ID: Troy Banks, male   DOB: Dec 13, 1940, 79 y.o.   MRN: [id number]  Virtual Visit via Video Note  I connected with Troy Banks on 03/16/20 at  2:40 PM EDT by a video enabled telemedicine application and verified that I am speaking with the correct person using two identifiers.  Location of all participants today Patient: at home with wife Provider: at office   I discussed the limitations of evaluation and management by telemedicine and the availability of in person appointments. The patient expressed understanding and agreed to proceed.  History of Present Illness: Here with c/o 2 mo localized right parietal HA with wax and wane, more on than off, usually mild, occasinally severe, sharp, seems to be more behind the right eye, but without vision change, and Pt denies new neurological symptoms such as facial or extremity weakness or numbness.  No fever, sinus symptoms, or trauma.  BP < 140/90 at home, incidentally has NS consult at [facility] for tremor soon.  Pt denies chest pain, increased sob or doe, wheezing, orthopnea, PND, increased LE swelling, palpitations, dizziness or syncope.   Pt denies polydipsia, polyuria  Pt denies fever, wt loss, night sweats, loss of appetite, or other constitutional symptoms Past Medical History:  Diagnosis Date  . Allergy   . Anxiety    PTSD  . Aortic stenosis, moderate 03/27/2015   post AVR echo Echo 11/16: Mild LVH, EF 55-60%, normal wall motion, grade 1 diastolic dysfunction, bioprosthetic AVR okay (mean gradient 18 mmHg) with trivial AI, mild LAE, atrial septal aneurysm, small effusion   . Arthritis    "qwhere" (10/29/2015)  . Cancer ([place])    skin cancer  . Carotid artery stenosis    Carotid US 11/17: 1-39% bilateral ICA stenosis. F/u prn  . Cervical stenosis of spinal canal    fusion 2006  . CHF (congestive heart failure) ([place])    "secondry to OHS"  . Closed left pilon fracture   . COLONIC POLYPS, HX OF   . Complication of anesthesia     "takes a lot to put him under and has woken up during surgery" Difficult to wake up . PTSD; "does not metabolize RX well"that he gets "violent", per pt.   . Coronary artery disease   . DIVERTICULOSIS, COLON   . Dysrhythmia    2 bouts of afib post op and at home but corrected with medication.  . Familial tremor 03/19/2014  . Family history of adverse reaction to anesthesia    Father - hold to get asleep  . GERD (gastroesophageal reflux disease)    uses Zantac  . GOUT   . Headache   . Heart murmur    had aortic value replacement  . High cholesterol   . History of blood transfusion   . History of kidney stones    surgery  . HYPERTENSION    off ACEI 2010 because of hyperkalemia in setting of ARI  . Hypothyroidism    newly diaganosed 09/2015  . Myocardial infarction [facility])    October 20th 2016  . OSA on CPAP   . Pneumonia   . 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 PTSD (post-traumatic stress disorder)   . PTSD (post-traumatic stress disorder)   . Sleep apnea    On a CPAP   Past Surgical History:  Procedure Laterality Date  . ANKLE FUSION Left 03/17/2017   Procedure: FUSION  PILON FRACTURE WITH BONE  GRAFTING;  Surgeon: Shona Needles, MD;  Location: [place];  Service: Orthopedics;  Laterality: Left;  . ANTERIOR FUSION CERVICAL SPINE  2002  . AORTIC VALVE REPLACEMENT N/A 03/30/2015   Procedure: AORTIC VALVE REPLACEMENT (AVR);  Surgeon: Grace Isaac, MD;  Location: [place];  Service: Open Heart Surgery;  Laterality: N/A;  . CARDIAC CATHETERIZATION N/A 03/26/2015   Procedure: Left Heart Cath and Coronary Angiography;  Surgeon: Leonie Man, MD;  Location: [place] [facility];  Service: Cardiovascular;  Laterality: N/A;  . CARDIAC VALVE REPLACEMENT    . COLONOSCOPY W/ POLYPECTOMY    . CORONARY ARTERY BYPASS GRAFT N/A 03/30/2015   Procedure: CORONARY ARTERY BYPASS GRAFTING (CABG) x four, using left internal  mammary artery and left    leg greater saphenous vein harvested endoscopically ;  Surgeon: Grace Isaac, MD;  Location: [place];  Service: Open Heart Surgery;  Laterality: N/A;  . CYSTOSCOPY/URETEROSCOPY/HOLMIUM LASER/STENT PLACEMENT Bilateral 11/07/2016   Procedure: CYSTOSCOPY/URETEROSCOPY/HOLMIUM LASER/STENT PLACEMENT;  Surgeon: Nickie Retort, MD;  [facility];  Service: Urology;  Laterality: Bilateral;  . CYSTOSCOPY/URETEROSCOPY/HOLMIUM LASER/STENT PLACEMENT Bilateral 11/23/2016   Procedure: CYSTOSCOPY/BILATERAL URETEROSCOPY/HOLMIUM LASER/STENT EXCHANGE;  Surgeon: Nickie Retort, MD;  [facility];  Service: Urology;  Laterality: Bilateral;  NEEDS 90 MIN   . EXTERNAL FIXATION LEG Left 02/22/2017   Procedure: EXTERNAL FIXATION LEFT ANKLE;  Surgeon: Renette Butters, MD;  Location: [place];  Service: Orthopedics;  Laterality: Left;  . HEMORRHOID BANDING  1970s  . INGUINAL HERNIA REPAIR Left 1962  . [place]   "cut me open"  . LEFT HEART CATH AND CORS/GRAFTS ANGIOGRAPHY N/A 10/25/2018   Procedure: LEFT HEART CATH AND CORS/GRAFTS ANGIOGRAPHY;  Surgeon: Leonie Man, MD;  Location: [place] [facility];  Service: Cardiovascular;  Laterality: N/A;  . POSTERIOR FUSION CERVICAL SPINE  2006  . TEE WITHOUT CARDIOVERSION N/A 03/30/2015   Procedure: TRANSESOPHAGEAL ECHOCARDIOGRAM (TEE);  Surgeon: Grace Isaac, MD;  Location: [place];  Service: Open Heart Surgery;  Laterality: N/A;  . TESTICLE TORSION REDUCTION Right 1960  . TOE FUSION Left 1985 & 2004   "great toe" and removal  . TONSILLECTOMY    . TOTAL HIP ARTHROPLASTY Right 10/27/2015   Procedure: RIGHT TOTAL HIP ARTHROPLASTY ANTERIOR APPROACH and steroid injection right foot;  Surgeon: Mcarthur Rossetti, MD;  Location: [place];  Service: Orthopedics;  Laterality: Right;  . TOTAL HIP ARTHROPLASTY Left 08/13/2019  . TOTAL HIP ARTHROPLASTY Left 08/13/2019   Procedure: LEFT TOTAL HIP ARTHROPLASTY ANTERIOR  APPROACH;  Surgeon: Mcarthur Rossetti, MD;  Location: [place];  Service: Orthopedics;  Laterality: Left;    reports that he quit smoking about 29 years ago. His smoking use included cigarettes. He has a 34.00 pack-year smoking history. He has never used smokeless tobacco. He reports current alcohol use of about 3.0 standard drinks of alcohol per week. He reports that he does not use drugs. family history includes Alcohol abuse in an other family member; Arthritis in an other family member; Diabetes in his mother and paternal grandfather; Drug abuse in his sister; Healthy in his brother, daughter, and son; Heart disease in his father, maternal grandfather, maternal grandmother, mother, and paternal grandfather; Hypertension in his father, maternal grandfather, maternal grandmother, mother, and paternal grandfather; Prostate cancer in his maternal uncle; Tremor in his sister. Allergies  Allergen Reactions  . Acyclovir And Related Other (See Comments)    Accumulates and causes stroke-like side-effects due to  cytochrome P450 enzyme deficiency  . Benzodiazepines Other (See Comments)    Patient states he gets stroke symptoms with benzo's.   . Compazine [Prochlorperazine Maleate] Other (See Comments)    Accumulates and causes stroke-like side-effects due to cytochrome P450 enzyme deficiency  . Coreg [Carvedilol] Other (See Comments)    Accumulates and causes stroke-like side-effects due to cytochrome P450 enzyme deficiency Patient poor metabolizer of CYP2D6 - Coreg undergoes extensive hepatic (including 2D6)  . Flomax [Tamsulosin Hcl] Other (See Comments)    Accumulates and causes stroke-like side-effects due to cytochrome P450 enzyme deficiency  . Lisinopril Other (See Comments)    Hyperkalemia. 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  . Mobic [Meloxicam] Other (See Comments)    Accumulates and causes stroke-like side-effects due to cytochrome P450  enzyme deficiency  . Ondansetron Other (See Comments)    Accumulates and causes stroke-like side-effects due to cytochrome P450 enzyme deficiency   . Oxycodone Other (See Comments)    Accumulates and causes stroke-like side-effects due to cytochrome P450 enzyme deficiency  . Promethazine Other (See Comments)    Accumulates and causes stroke-like side-effects due to cytochrome P450 enzyme deficiency  . Sertraline Hcl Other (See Comments)    Accumulates and causes stroke-like side-effects due to cytochrome P450 enzyme deficiency  . Tramadol Other (See Comments)    Accumulates and causes stroke-like side-effects due to cytochrome P450 enzyme deficiency  . Atorvastatin Other (See Comments)    MYALGIAS  . Colchicine Other (See Comments)    ataxia   Current Outpatient Medications on File Prior to Visit  Medication Sig Dispense Refill  . allopurinol (ZYLOPRIM) 300 MG tablet TAKE 1 TABLET BY MOUTH EVERY DAY 90 tablet 1  . amLODipine (NORVASC) 10 MG tablet TAKE 1 TABLET BY MOUTH EVERY DAY 90 tablet 1  . Ascorbic Acid (VITAMIN C) 1000 MG tablet Take 1,000 mg by mouth 2 (two) times daily.     [name] aspirin EC 81 MG tablet Take 81 mg by mouth daily. Swallow whole.    . calcium carbonate (TUMS EX) 750 MG chewable tablet Chew 1 tablet by mouth daily as needed for heartburn.     . Carboxymethylcellulose Sodium (LUBRICANT EYE DROPS OP) Place 1 drop into both eyes 2 (two) times daily as needed (dry/irritated eyes.).     [name] Cholecalciferol (VITAMIN D-3) 125 MCG (5000 UT) TABS Take 5,000 Units by mouth 2 (two) times a day.    . famotidine (PEPCID) 40 MG tablet TAKE 1 TABLET BY MOUTH EVERY DAY. MUST SEE PROVIDER FOR FUTURE REFILLS 90 tablet 2  . furosemide (LASIX) 40 MG tablet TAKE 1 TABLET BY MOUTH EVERY OTHER DAY 45 tablet 2  . levothyroxine (SYNTHROID) 25 MCG tablet TAKE 1 TABLET BY MOUTH EVERY DAY BEFORE BREAKFAST 30 tablet 0  . nitroGLYCERIN (NITROSTAT) 0.4 MG SL tablet PLACE 1 TABLET UNDER THE TONGUE EVERY  5 MINUTES AS NEEDED FOR CHEST PAIN. (Patient taking differently: Place 0.4 mg under the tongue every 5 (five) minutes as needed for chest pain. ) 25 tablet 3  . primidone (MYSOLINE) 250 MG tablet Take 1 tablet (250 mg total) by mouth in the morning and at bedtime. 180 tablet 1  . rosuvastatin (CRESTOR) 10 MG tablet TAKE 1/2 TABLET BY MOUTH EVERY DAY 45 tablet 1  . Turmeric 500 MG CAPS Take 500 mg by mouth daily.     . vitamin B-12 (CYANOCOBALAMIN) 1000 MCG tablet Take 1,000 mcg by mouth daily.     . [  DISCONTINUED] ranitidine (ZANTAC) 150 MG tablet TAKE 1 TABLET BY MOUTH 2 TIMES DAILY (Patient taking differently: Take 150 mg by mouth 2 (two) times a day. ) 180 tablet 0   Current Facility-Administered Medications on File Prior to Visit  Medication Dose Route Frequency Provider Last Rate Last Admin  . sodium chloride flush (NS) 0.9 % injection 3 mL  3 mL Intravenous Q12H Almyra Deforest, PA        Observations/Objective: Alert, NAD, appropriate mood and affect, resps normal, cn 2-12 intact, moves all 4s, no visible rash or swelling Lab Results  Component Value Date   WBC 5.4 12/04/2019   HGB 15.1 12/04/2019   HCT 44.5 12/04/2019   PLT 126.0 (L) 12/04/2019   GLUCOSE 122 (H) 12/04/2019   CHOL 130 12/04/2019   TRIG 285.0 (H) 12/04/2019   HDL 41.00 12/04/2019   LDLDIRECT 63.0 12/04/2019   LDLCALC 65 11/02/2018   ALT 22 12/04/2019   AST 19 12/04/2019   NA 140 12/04/2019   K 4.0 12/04/2019   CL 105 12/04/2019   CREATININE 0.95 12/04/2019   BUN 20 12/04/2019   CO2 27 12/04/2019   TSH 2.42 12/04/2019   PSA 1.76 12/04/2019   INR 1.47 03/30/2015   HGBA1C 6.0 12/04/2019   MICROALBUR 4.3 (H) 10/31/2017   Assessment and Plan: See notes  Follow Up Instructions: See notes   I discussed the assessment and treatment plan with the patient. The patient was provided an opportunity to ask questions and all were answered. The patient agreed with the plan and demonstrated an understanding of the  instructions.   The patient was advised to call back or seek an in-person evaluation if the symptoms worsen or if the condition fails to improve as anticipated.  Cathlean Cower, MD

## 2020-03-25 ENCOUNTER — Ambulatory Visit: Payer: Medicare Other | Admitting: Orthopaedic Surgery

## 2020-03-25 ENCOUNTER — Ambulatory Visit: Payer: Self-pay

## 2020-03-25 ENCOUNTER — Encounter: Payer: Self-pay | Admitting: Orthopaedic Surgery

## 2020-03-25 VITALS — Ht 68.0 in | Wt 208.0 lb

## 2020-03-25 DIAGNOSIS — Z96642 Presence of left artificial hip joint: Secondary | ICD-10-CM | POA: Diagnosis not present

## 2020-03-25 NOTE — Progress Notes (Signed)
Office Visit Note   Patient: Troy Banks           Date of Birth: [birth year]           MRN: [id number] Visit Date: 03/25/2020              Requested by: Troy Borg, MD [place],  [place] [address] PCP: Troy Borg, MD   Assessment & Plan: Visit Diagnoses:  1. Status post total replacement of left hip     Plan: We will have him work on IT band stretching exercises that shown today.  He will follow up with [place] at 1 year postop obtain an AP pelvis at that time.  Questions were encouraged and answered.  He can always follow-up earlier if he develops severe hip pain on the left side we could do a trochanteric injection for him.  Questions were encouraged and answered  Follow-Up Instructions: Return in about 5 months (around 08/23/2020).   Orders:  Orders Placed This Encounter  Procedures  . XR HIP UNILAT W OR W/O PELVIS 2-3 VIEWS LEFT   No orders of the defined types were placed in this encounter.     Procedures: No procedures performed   Clinical Data: No additional findings.   Subjective: Chief Complaint  Patient presents with  . Left Hip - Follow-up    Left total hip arthroplasty 08/13/2019    HPI Troy Banks returns today 7 months status post left total hip arthroplasty.  He is overall doing well.  He has some discomfort lateral aspect of the hip at times.  But he relates this to his pilon fracture altering his gait on his left side due to the fact that he is unable to dorsiflex through the ankle.  He otherwise has no complaints.  Denies any numbness tingling down the left leg.  Review of Systems  Constitutional: Negative for chills and fever.  Respiratory: Negative for shortness of breath.      Objective: Vital Signs: Ht 5\' 8"  (1.727 m)   Wt 208 lb (94.3 kg)   BMI 31.63 kg/m   Physical Exam Constitutional:      Appearance: He is not ill-appearing or diaphoretic.  Pulmonary:     Effort: Pulmonary effort is normal.  Neurological:      Mental Status: He is alert and oriented to person, place, and time.  Psychiatric:        Mood and Affect: Mood normal.     Ortho Exam Bilateral hips good fluid range of motion of both hips without pain.  Tenderness over the left trochanteric region.  Leg lengths are equal.  Specialty Comments:  No specialty comments available.  Imaging: XR HIP UNILAT W OR W/O PELVIS 2-3 VIEWS LEFT  Result Date: 03/25/2020 AP pelvis lateral view left hip: AP view shows bilateral hips status post total hip arthroplasties with well-seated components.  Lateral hip of the left side shows a well-seated left total hip arthroplasty without any complicating features.  Has formed some heterotopic bone in the soft tissue superior region of the hip.    PMFS History: Patient Active Problem List   Diagnosis Date Noted  . Headache 03/16/2020  . Ankle pain 01/22/2020  . Hypothyroidism 11/26/2019  . Epididymitis 08/28/2019  . Unilateral primary osteoarthritis, left hip 08/13/2019  . Status post total replacement of left hip 08/13/2019  . Abdominal wall hernia 11/02/2018  . Progressive angina ([place]) 10/25/2018  . DOE (dyspnea on exertion) 10/25/2018  .  Abscess of right buttock 11/27/2017  . Acute sinus infection 11/05/2017  . Pilon fracture 03/17/2017  . Pilon fracture of left tibia 03/17/2017  . At risk for adverse drug reaction 02/28/2017  . Closed fracture dislocation of ankle joint, left, with delayed healing, subsequent encounter 02/22/2017  . Renal stone 10/26/2016  . Gross hematuria 10/26/2016  . Bladder neck obstruction 10/26/2016  . External hemorrhoid, thrombosed 06/10/2016  . Internal hemorrhoids 06/10/2016  . Lumbar back pain 05/19/2016  . Osteoarthritis of right hip 10/27/2015  . Preventative health care 10/27/2015  . Chronic chest pain 09/22/2015  . Acute gouty arthritis 06/11/2015  . Coronary artery disease involving native coronary artery with angina pectoris ([place]) 06/11/2015  .  Chronic diastolic CHF (congestive heart failure) ([place]) 06/11/2015  . H/O tissue AVR Oct 2016 06/10/2015  . PAF post CABG/AVR- Amiodarone 06/10/2015  . Dyslipidemia 06/10/2015  . S/P CABG x 4-Oct 2016 03/30/2015  . H/O NSTEMI-Oct 2016 03/27/2015  . 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 Depression 09/08/2014  . GERD (gastroesophageal reflux disease)   . Essential tremor 03/31/2014  . Iliotibial band syndrome affecting left lower leg 01/01/2014  . Spondylolisthesis at L4-L5 level 01/01/2014  . Sinus bradycardia 12/31/2013  . Greater trochanteric bursitis of right hip 12/04/2013  . OSA on CPAP   . Diet-controlled diabetes mellitus ([place]) 07/08/2009  . COLONIC POLYPS, HX OF 12/31/2008  . GOUT 12/22/2008  . Essential hypertension 12/22/2008  . Diverticulosis of colon 12/22/2008  . NEPHROLITHIASIS, HX OF 12/22/2008   Past Medical History:  Diagnosis Date  . Allergy   . Anxiety    PTSD  . Aortic stenosis, moderate 03/27/2015   post AVR echo Echo 11/16: Mild LVH, EF 55-60%, normal wall motion, grade 1 diastolic dysfunction, bioprosthetic AVR okay (mean gradient 18 mmHg) with trivial AI, mild LAE, atrial septal aneurysm, small effusion   . Arthritis    "qwhere" (10/29/2015)  . Cancer ([place])    skin cancer  . Carotid artery stenosis    Carotid US 11/17: 1-39% bilateral ICA stenosis. F/u prn  . Cervical stenosis of spinal canal    fusion 2006  . CHF (congestive heart failure) ([place])    "secondry to OHS"  . Closed left pilon fracture   . COLONIC POLYPS, HX OF   . Complication of anesthesia    "takes a lot to put him under and has woken up during surgery" Difficult to wake up . PTSD; "does not metabolize RX well"that he gets "violent", per pt.   . Coronary artery disease   . DIVERTICULOSIS, COLON   . Dysrhythmia    2 bouts of afib post op and at home but corrected with medication.  . Familial tremor 03/19/2014  . Family history of adverse reaction to  anesthesia    Father - hold to get asleep  . GERD (gastroesophageal reflux disease)    uses Zantac  . GOUT   . Headache   . Heart murmur    had aortic value replacement  . High cholesterol   . History of blood transfusion   . History of kidney stones    surgery  . HYPERTENSION    off ACEI 2010 because of hyperkalemia in setting of ARI  . Hypothyroidism    newly diaganosed 09/2015  . Myocardial infarction [facility])    October 20th 2016  . OSA on CPAP   . Pneumonia   . 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 PTSD (post-traumatic stress disorder)   . PTSD (post-traumatic stress disorder)   . Sleep apnea    On a CPAP    Family History  Problem Relation Age of Onset  . Heart disease Mother   . Hypertension Mother   . Diabetes Mother   . Heart disease Father   . Hypertension Father   . Tremor Sister   . Healthy Brother   . Healthy Son   . Prostate cancer Maternal Uncle   . Hypertension Maternal Grandmother   . Heart disease Maternal Grandmother   . Hypertension Maternal Grandfather   . Heart disease Maternal Grandfather   . Hypertension Paternal Grandfather   . Heart disease Paternal Grandfather   . Diabetes Paternal Grandfather   . Alcohol abuse Other   . Arthritis Other   . Drug abuse Sister   . Healthy Daughter   . Colon polyps Neg Hx   . Esophageal cancer Neg Hx   . Rectal cancer Neg Hx   . Stomach cancer Neg Hx   . Colon cancer Neg Hx     Past Surgical History:  Procedure Laterality Date  . ANKLE FUSION Left 03/17/2017   Procedure: FUSION  PILON FRACTURE WITH BONE GRAFTING;  Surgeon: Shona Needles, MD;  Location: [place];  Service: Orthopedics;  Laterality: Left;  . ANTERIOR FUSION CERVICAL SPINE  2002  . AORTIC VALVE REPLACEMENT N/A 03/30/2015   Procedure: AORTIC VALVE REPLACEMENT (AVR);  Surgeon: Grace Isaac, MD;  Location: [place];  Service: Open Heart Surgery;  Laterality: N/A;  .  CARDIAC CATHETERIZATION N/A 03/26/2015   Procedure: Left Heart Cath and Coronary Angiography;  Surgeon: Leonie Man, MD;  Location: [place] [facility];  Service: Cardiovascular;  Laterality: N/A;  . CARDIAC VALVE REPLACEMENT    . COLONOSCOPY W/ POLYPECTOMY    . CORONARY ARTERY BYPASS GRAFT N/A 03/30/2015   Procedure: CORONARY ARTERY BYPASS GRAFTING (CABG) x four, using left internal mammary artery and left    leg greater saphenous vein harvested endoscopically ;  Surgeon: Grace Isaac, MD;  Location: [place];  Service: Open Heart Surgery;  Laterality: N/A;  . CYSTOSCOPY/URETEROSCOPY/HOLMIUM LASER/STENT PLACEMENT Bilateral 11/07/2016   Procedure: CYSTOSCOPY/URETEROSCOPY/HOLMIUM LASER/STENT PLACEMENT;  Surgeon: Nickie Retort, MD;  [facility];  Service: Urology;  Laterality: Bilateral;  . CYSTOSCOPY/URETEROSCOPY/HOLMIUM LASER/STENT PLACEMENT Bilateral 11/23/2016   Procedure: CYSTOSCOPY/BILATERAL URETEROSCOPY/HOLMIUM LASER/STENT EXCHANGE;  Surgeon: Nickie Retort, MD;  [facility];  Service: Urology;  Laterality: Bilateral;  NEEDS 90 MIN   . EXTERNAL FIXATION LEG Left 02/22/2017   Procedure: EXTERNAL FIXATION LEFT ANKLE;  Surgeon: Renette Butters, MD;  Location: [place];  Service: Orthopedics;  Laterality: Left;  . HEMORRHOID BANDING  1970s  . INGUINAL HERNIA REPAIR Left 1962  . [place]   "cut me open"  . LEFT HEART CATH AND CORS/GRAFTS ANGIOGRAPHY N/A 10/25/2018   Procedure: LEFT HEART CATH AND CORS/GRAFTS ANGIOGRAPHY;  Surgeon: Leonie Man, MD;  Location: [place] [facility];  Service: Cardiovascular;  Laterality: N/A;  . POSTERIOR FUSION CERVICAL SPINE  2006  . TEE WITHOUT CARDIOVERSION N/A 03/30/2015   Procedure: TRANSESOPHAGEAL ECHOCARDIOGRAM (TEE);  Surgeon: Grace Isaac, MD;  Location: [place];  Service: Open Heart Surgery;  Laterality: N/A;  . TESTICLE TORSION REDUCTION Right 1960  . TOE FUSION Left 1985 & 2004   "great toe" and removal    . TONSILLECTOMY    .  TOTAL HIP ARTHROPLASTY Right 10/27/2015   Procedure: RIGHT TOTAL HIP ARTHROPLASTY ANTERIOR APPROACH and steroid injection right foot;  Surgeon: Mcarthur Rossetti, MD;  Location: [place];  Service: Orthopedics;  Laterality: Right;  . TOTAL HIP ARTHROPLASTY Left 08/13/2019  . TOTAL HIP ARTHROPLASTY Left 08/13/2019   Procedure: LEFT TOTAL HIP ARTHROPLASTY ANTERIOR APPROACH;  Surgeon: Mcarthur Rossetti, MD;  Location: [place];  Service: Orthopedics;  Laterality: Left;   Social History   Occupational History  . Occupation: Retired Animal nutritionist  Tobacco Use  . Smoking status: Former Smoker    Packs/day: 1.00    Years: 34.00    Pack years: 34.00    Types: Cigarettes    Quit date: 06/06/1990    Years since quitting: 29.8  . Smokeless tobacco: Never Used  Vaping Use  . Vaping Use: Never used  Substance and Sexual Activity  . Alcohol use: Yes    Alcohol/week: 3.0 standard drinks    Types: 1 Cans of beer, 2 Shots of liquor per week  . Drug use: No  . Sexual activity: Yes

## 2020-03-29 ENCOUNTER — Encounter: Payer: Self-pay | Admitting: Internal Medicine

## 2020-03-29 NOTE — Assessment & Plan Note (Addendum)
Atypical, etiology unclear, for Head MRI,  to f/u any worsening symptoms or concerns  I spent 31 minutes in preparing to see the patient by review of recent labs, imaging and procedures, obtaining and reviewing separately obtained history, communicating with the patient and family or caregiver, ordering medications, tests or procedures, and documenting clinical information in the EHR including the differential Dx, treatment, and any further evaluation and other management of HA, dm, htn

## 2020-03-29 NOTE — Assessment & Plan Note (Signed)
stable overall by history and exam, recent data reviewed with pt, and pt to continue medical treatment as before,  to f/u any worsening symptoms or concerns  

## 2020-03-29 NOTE — Patient Instructions (Signed)
Please continue all other medications as before, and refills have been done if requested.  Please have the pharmacy call with any other refills you may need.  Please continue your efforts at being more active, low cholesterol diet, and weight control.  You are otherwise up to date with prevention measures today.  Please keep your appointments with your specialists as you may have planned  You will be contacted regarding the referral for: MRI

## 2020-04-02 ENCOUNTER — Other Ambulatory Visit: Payer: Self-pay | Admitting: Internal Medicine

## 2020-04-02 NOTE — Telephone Encounter (Signed)
Please refill as per office routine med refill policy (all routine meds refilled for 3 mo or monthly per pt preference up to one year from last visit, then month to month grace period for 3 mo, then further med refills will have to be denied)  

## 2020-04-05 ENCOUNTER — Ambulatory Visit
Admission: RE | Admit: 2020-04-05 | Discharge: 2020-04-05 | Disposition: A | Discharge status: Home / Self Care | Payer: Medicare Other | Source: Ambulatory Visit | Attending: Internal Medicine | Admitting: Internal Medicine

## 2020-04-05 ENCOUNTER — Other Ambulatory Visit: Payer: Self-pay

## 2020-04-05 ENCOUNTER — Encounter: Payer: Self-pay | Admitting: Internal Medicine

## 2020-04-05 DIAGNOSIS — J341 Cyst and mucocele of nose and nasal sinus: Secondary | ICD-10-CM | POA: Diagnosis not present

## 2020-04-05 DIAGNOSIS — I6389 Other cerebral infarction: Secondary | ICD-10-CM | POA: Diagnosis not present

## 2020-04-05 DIAGNOSIS — I6782 Cerebral ischemia: Secondary | ICD-10-CM | POA: Diagnosis not present

## 2020-04-05 DIAGNOSIS — G4489 Other headache syndrome: Secondary | ICD-10-CM

## 2020-04-05 DIAGNOSIS — Z8673 Personal history of transient ischemic attack (TIA), and cerebral infarction without residual deficits: Secondary | ICD-10-CM | POA: Insufficient documentation

## 2020-04-05 DIAGNOSIS — G319 Degenerative disease of nervous system, unspecified: Secondary | ICD-10-CM | POA: Diagnosis not present

## 2020-04-06 DIAGNOSIS — R1084 Generalized abdominal pain: Secondary | ICD-10-CM | POA: Diagnosis not present

## 2020-04-06 DIAGNOSIS — N200 Calculus of kidney: Secondary | ICD-10-CM | POA: Diagnosis not present

## 2020-04-06 DIAGNOSIS — R319 Hematuria, unspecified: Secondary | ICD-10-CM | POA: Diagnosis not present

## 2020-04-06 DIAGNOSIS — E278 Other specified disorders of adrenal gland: Secondary | ICD-10-CM | POA: Diagnosis not present

## 2020-04-06 DIAGNOSIS — R311 Benign essential microscopic hematuria: Secondary | ICD-10-CM | POA: Diagnosis not present

## 2020-04-06 DIAGNOSIS — K8020 Calculus of gallbladder without cholecystitis without obstruction: Secondary | ICD-10-CM | POA: Diagnosis not present

## 2020-04-08 ENCOUNTER — Other Ambulatory Visit: Payer: Self-pay | Admitting: Neurology

## 2020-05-07 ENCOUNTER — Other Ambulatory Visit: Payer: Self-pay | Admitting: Internal Medicine

## 2020-05-07 NOTE — Telephone Encounter (Signed)
Please refill as per office routine med refill policy (all routine meds refilled for 3 mo or monthly per pt preference up to one year from last visit, then month to month grace period for 3 mo, then further med refills will have to be denied)  

## 2020-05-12 DIAGNOSIS — R251 Tremor, unspecified: Secondary | ICD-10-CM | POA: Diagnosis not present

## 2020-05-12 DIAGNOSIS — G250 Essential tremor: Secondary | ICD-10-CM | POA: Diagnosis not present

## 2020-05-21 ENCOUNTER — Telehealth: Payer: Self-pay | Admitting: Neurology

## 2020-05-21 NOTE — Telephone Encounter (Signed)
Ok

## 2020-05-21 NOTE — Telephone Encounter (Signed)
Please tell him I said that I am glad for him and good luck!  We will go ahead and cancel his follow up here since he won't need that.

## 2020-05-21 NOTE — Telephone Encounter (Signed)
Patient's friend [name] called in wanting to let Dr. Carles Collet know the patient is having "his procedure" at [facility] on 06/18/20. She does not need a call back.

## 2020-05-21 NOTE — Telephone Encounter (Signed)
Put a reminder note for you to call him the week after his focused ultrasound as he won't need the appt as long as doing well.

## 2020-05-21 NOTE — Telephone Encounter (Signed)
Spoke with [name] who states she is a retired Therapist, sports and does not want to cancel appointment incase the  surgery does not work. She states she does not want [place] to cancel him out of the system just yet and requests we not to cancel his upcoming appointment.

## 2020-05-27 ENCOUNTER — Ambulatory Visit: Payer: Medicare Other | Admitting: Podiatry

## 2020-05-27 ENCOUNTER — Encounter: Payer: Self-pay | Admitting: Podiatry

## 2020-05-27 ENCOUNTER — Other Ambulatory Visit: Payer: Self-pay

## 2020-05-27 DIAGNOSIS — M79675 Pain in left toe(s): Secondary | ICD-10-CM | POA: Diagnosis not present

## 2020-05-27 DIAGNOSIS — B351 Tinea unguium: Secondary | ICD-10-CM

## 2020-05-27 DIAGNOSIS — M79674 Pain in right toe(s): Secondary | ICD-10-CM | POA: Diagnosis not present

## 2020-05-27 DIAGNOSIS — E119 Type 2 diabetes mellitus without complications: Secondary | ICD-10-CM | POA: Diagnosis not present

## 2020-05-27 NOTE — Progress Notes (Signed)
This patient returns to the office for evaluation and treatment of long thick painful nails .  This patient is unable to trim his own nails since the patient cannot reach the feet.  Patient says the nails are painful walking and wearing his shoes.  He returns for preventive foot care services.    General Appearance  Alert, conversant and in no acute stress.  Vascular  Dorsalis pedis and posterior tibial  pulses are palpable  bilaterally.  Capillary return is within normal limits  bilaterally. Temperature is within normal limits  bilaterally.  Neurologic  Senn-Weinstein monofilament wire test within normal limits  bilaterally. Muscle power within normal limits bilaterally.  Nails Thick disfigured discolored nails with subungual debris  from hallux to fifth toes bilaterally. No evidence of bacterial infection or drainage bilaterally.  Orthopedic  No limitations of motion  feet .  No crepitus or effusions noted.  No bony pathology or digital deformities noted.  Ankle/STJ immobile left foot/ankle.  Fused 1st MPJ ight foot.  Skin  normotropic skin with no porokeratosis noted bilaterally.  No signs of infections or ulcers noted.     Onychomycosis  Pain in toes right foot  Pain in toes left foot  Debridement  of nails  1-5  B/L with a nail nipper.  Nails were then filed using a dremel tool with no incidents.    RTC  4 month   Gardiner Barefoot DPM

## 2020-06-08 ENCOUNTER — Other Ambulatory Visit: Payer: Self-pay | Admitting: Internal Medicine

## 2020-06-08 NOTE — Telephone Encounter (Signed)
Please refill as per office routine med refill policy (all routine meds refilled for 3 mo or monthly per pt preference up to one year from last visit, then month to month grace period for 3 mo, then further med refills will have to be denied)  

## 2020-06-09 ENCOUNTER — Other Ambulatory Visit: Payer: Self-pay | Admitting: Internal Medicine

## 2020-06-09 NOTE — Telephone Encounter (Signed)
Please refill as per office routine med refill policy (all routine meds refilled for 3 mo or monthly per pt preference up to one year from last visit, then month to month grace period for 3 mo, then further med refills will have to be denied)  

## 2020-06-10 ENCOUNTER — Other Ambulatory Visit: Payer: Self-pay

## 2020-06-10 ENCOUNTER — Ambulatory Visit (INDEPENDENT_AMBULATORY_CARE_PROVIDER_SITE_OTHER): Payer: Medicare Other | Admitting: Internal Medicine

## 2020-06-10 ENCOUNTER — Telehealth: Payer: Self-pay | Admitting: Internal Medicine

## 2020-06-10 ENCOUNTER — Encounter: Payer: Self-pay | Admitting: Internal Medicine

## 2020-06-10 VITALS — BP 166/88 | HR 62 | Temp 98.6°F | Ht 68.0 in | Wt 218.0 lb

## 2020-06-10 DIAGNOSIS — I10 Essential (primary) hypertension: Secondary | ICD-10-CM

## 2020-06-10 DIAGNOSIS — E559 Vitamin D deficiency, unspecified: Secondary | ICD-10-CM | POA: Diagnosis not present

## 2020-06-10 DIAGNOSIS — Z8042 Family history of malignant neoplasm of prostate: Secondary | ICD-10-CM | POA: Diagnosis not present

## 2020-06-10 DIAGNOSIS — Z0000 Encounter for general adult medical examination without abnormal findings: Secondary | ICD-10-CM | POA: Diagnosis not present

## 2020-06-10 DIAGNOSIS — E538 Deficiency of other specified B group vitamins: Secondary | ICD-10-CM

## 2020-06-10 DIAGNOSIS — R1011 Right upper quadrant pain: Secondary | ICD-10-CM | POA: Diagnosis not present

## 2020-06-10 DIAGNOSIS — E119 Type 2 diabetes mellitus without complications: Secondary | ICD-10-CM | POA: Diagnosis not present

## 2020-06-10 DIAGNOSIS — Z0001 Encounter for general adult medical examination with abnormal findings: Secondary | ICD-10-CM

## 2020-06-10 DIAGNOSIS — E039 Hypothyroidism, unspecified: Secondary | ICD-10-CM | POA: Diagnosis not present

## 2020-06-10 LAB — PSA: PSA: 1.49 ng/mL (ref 0.10–4.00)

## 2020-06-10 LAB — CBC WITH DIFFERENTIAL/PLATELET
Basophils Absolute: 0.1 10*3/uL (ref 0.0–0.1)
Basophils Relative: 1.2 % (ref 0.0–3.0)
Eosinophils Absolute: 0.1 10*3/uL (ref 0.0–0.7)
Eosinophils Relative: 1.7 % (ref 0.0–5.0)
HCT: 44.9 % (ref 39.0–52.0)
Hemoglobin: 15.1 g/dL (ref 13.0–17.0)
Lymphocytes Relative: 20.1 % (ref 12.0–46.0)
Lymphs Abs: 1.5 10*3/uL (ref 0.7–4.0)
MCHC: 33.7 g/dL (ref 30.0–36.0)
MCV: 93.4 fl (ref 78.0–100.0)
Monocytes Absolute: 0.8 10*3/uL (ref 0.1–1.0)
Monocytes Relative: 11.3 % (ref 3.0–12.0)
Neutro Abs: 5 10*3/uL (ref 1.4–7.7)
Neutrophils Relative %: 65.7 % (ref 43.0–77.0)
Platelets: 148 10*3/uL — ABNORMAL LOW (ref 150.0–400.0)
RBC: 4.81 Mil/uL (ref 4.22–5.81)
RDW: 14.1 % (ref 11.5–15.5)
WBC: 7.5 10*3/uL (ref 4.0–10.5)

## 2020-06-10 LAB — HEPATIC FUNCTION PANEL
ALT: 22 U/L (ref 0–53)
AST: 16 U/L (ref 0–37)
Albumin: 4.4 g/dL (ref 3.5–5.2)
Alkaline Phosphatase: 54 U/L (ref 39–117)
Bilirubin, Direct: 0.2 mg/dL (ref 0.0–0.3)
Total Bilirubin: 0.8 mg/dL (ref 0.2–1.2)
Total Protein: 6.4 g/dL (ref 6.0–8.3)

## 2020-06-10 LAB — LIPID PANEL
Cholesterol: 126 mg/dL (ref 0–200)
HDL: 40.2 mg/dL (ref >39.00)
LDL Cholesterol: 55 mg/dL (ref 0–99)
NonHDL: 85.71
Total CHOL/HDL Ratio: 3
Triglycerides: 156 mg/dL — ABNORMAL HIGH (ref 0.0–149.0)
VLDL: 31.2 mg/dL (ref 0.0–40.0)

## 2020-06-10 LAB — VITAMIN B12: Vitamin B-12: 1400 pg/mL — ABNORMAL HIGH (ref 211–911)

## 2020-06-10 LAB — BASIC METABOLIC PANEL
BUN: 19 mg/dL (ref 6–23)
CO2: 31 mEq/L (ref 19–32)
Calcium: 9.1 mg/dL (ref 8.4–10.5)
Chloride: 103 mEq/L (ref 96–112)
Creatinine, Ser: 1.01 mg/dL (ref 0.40–1.50)
GFR: 71.76 mL/min (ref >60.00)
Glucose, Bld: 91 mg/dL (ref 70–99)
Potassium: 3.9 mEq/L (ref 3.5–5.1)
Sodium: 140 mEq/L (ref 135–145)

## 2020-06-10 LAB — VITAMIN D 25 HYDROXY (VIT D DEFICIENCY, FRACTURES): VITD: 55 ng/mL (ref 30.00–100.00)

## 2020-06-10 LAB — HEMOGLOBIN A1C: Hgb A1c MFr Bld: 6 % (ref 4.6–6.5)

## 2020-06-10 LAB — TSH: TSH: 2.81 u[IU]/mL (ref 0.35–4.50)

## 2020-06-10 LAB — LIPASE: Lipase: 34 U/L (ref 11.0–59.0)

## 2020-06-10 LAB — MICROALBUMIN / CREATININE URINE RATIO
Creatinine,U: 88.9 mg/dL
Microalb Creat Ratio: 9.9 mg/g (ref 0.0–30.0)
Microalb, Ur: 8.8 mg/dL — ABNORMAL HIGH (ref 0.0–1.9)

## 2020-06-10 NOTE — Telephone Encounter (Signed)
Erica from [facility] called  The STAT order for the US Abdomen Complete is not available at there location until 06/24/2020.   Please advise.   The best contact number is [phone]

## 2020-06-10 NOTE — Patient Instructions (Signed)
Please continue all other medications as before, and refills have been done if requested.  Please have the pharmacy call with any other refills you may need.  Please continue your efforts at being more active, low cholesterol diet, and weight control.  You are otherwise up to date with prevention measures today.  Please keep your appointments with your specialists as you may have planned  You will be contacted regarding the referral for: abdomen ultrasound for next week  Please go to the LAB at the blood drawing area for the tests to be done  You will be contacted by phone if any changes need to be made immediately.  Otherwise, you will receive a letter about your results with an explanation, but please check with MyChart first.  Please remember to sign up for MyChart if you have not done so, as this will be important to you in the future with finding out test results, communicating by private email, and scheduling acute appointments online when needed.  Please make an Appointment to return in 6 months, or sooner if needed

## 2020-06-10 NOTE — Progress Notes (Signed)
Established Patient Office Visit  Subjective:  Patient ID: Troy Banks, male    DOB: 1941/03/12  Age: 80 y.o. MRN: [id number]       Chief Complaint:: wellness and RUQ pain, elevated BP,        HPI:  Troy Banks is a 80 y.o. male here for wellness  COVID Booster due in mar 2021 sched for [facility] appt soon for focused u/s for tremor.as well.  BP at home < 140/90 per pt and wife here today, and followed per cardiology doing well last visit.  Has intermittent chronic HA no change, hx of CVA but Pt denies new neurological symptoms such as new headache, or facial or extremity weakness or numbness  Also c/o 2 mo onset RUQ pain, sharp and dull, mild to mod, somewhat sore to touch, without sweling, rash, and Denies worsening reflux, other abd pain, dysphagia, n/v, bowel change or blood.   Pt denies fever, wt loss, night sweats, loss of appetite, or other constitutional symptoms  No hx of renal stone.  Pt tx is always limited due to CYP450 hepatic issue.  No other new complaints  Wt Readings from Last 3 Encounters:  06/10/20 218 lb (98.9 kg)  03/25/20 208 lb (94.3 kg)  02/07/20 208 lb 4 oz (94.5 kg)   BP Readings from Last 3 Encounters:  06/10/20 (!) 166/88  02/07/20 (!) 182/78  12/17/19 (!) 171/75         Past Medical History:  Diagnosis Date  . Allergy   . Anxiety    PTSD  . Aortic stenosis, moderate 03/27/2015   post AVR echo Echo 11/16: Mild LVH, EF 55-60%, normal wall motion, grade 1 diastolic dysfunction, bioprosthetic AVR okay (mean gradient 18 mmHg) with trivial AI, mild LAE, atrial septal aneurysm, small effusion   . Arthritis    "qwhere" (10/29/2015)  . Cancer ([place])    skin cancer  . Carotid artery stenosis    Carotid US 11/17: 1-39% bilateral ICA stenosis. F/u prn  . Cervical stenosis of spinal canal    fusion 2006  . CHF (congestive heart failure) ([place])    "secondry to OHS"  . Closed left pilon fracture   . COLONIC POLYPS, HX OF   . Complication of anesthesia     "takes a lot to put him under and has woken up during surgery" Difficult to wake up . PTSD; "does not metabolize RX well"that he gets "violent", per pt.   . Coronary artery disease   . DIVERTICULOSIS, COLON   . Dysrhythmia    2 bouts of afib post op and at home but corrected with medication.  . Familial tremor 03/19/2014  . Family history of adverse reaction to anesthesia    Father - hold to get asleep  . GERD (gastroesophageal reflux disease)    uses Zantac  . GOUT   . Headache   . Heart murmur    had aortic value replacement  . High cholesterol   . History of blood transfusion   . History of kidney stones    surgery  . HYPERTENSION    off ACEI 2010 because of hyperkalemia in setting of ARI  . Hypothyroidism    newly diaganosed 09/2015  . Myocardial infarction [facility]    October 20th 2016  . OSA on CPAP   . Pneumonia   . 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 PTSD (post-traumatic stress disorder)   .  PTSD (post-traumatic stress disorder)   . Sleep apnea    On a CPAP   Past Surgical History:  Procedure Laterality Date  . ANKLE FUSION Left 03/17/2017   Procedure: FUSION  PILON FRACTURE WITH BONE GRAFTING;  Surgeon: Shona Needles, MD;  Location: [place];  Service: Orthopedics;  Laterality: Left;  . ANTERIOR FUSION CERVICAL SPINE  2002  . AORTIC VALVE REPLACEMENT N/A 03/30/2015   Procedure: AORTIC VALVE REPLACEMENT (AVR);  Surgeon: Grace Isaac, MD;  Location: [place];  Service: Open Heart Surgery;  Laterality: N/A;  . CARDIAC CATHETERIZATION N/A 03/26/2015   Procedure: Left Heart Cath and Coronary Angiography;  Surgeon: Leonie Man, MD;  Location: [place] [facility];  Service: Cardiovascular;  Laterality: N/A;  . CARDIAC VALVE REPLACEMENT    . COLONOSCOPY W/ POLYPECTOMY    . CORONARY ARTERY BYPASS GRAFT N/A 03/30/2015   Procedure: CORONARY ARTERY BYPASS GRAFTING (CABG) x four, using left internal  mammary artery and left    leg greater saphenous vein harvested endoscopically ;  Surgeon: Grace Isaac, MD;  Location: [place];  Service: Open Heart Surgery;  Laterality: N/A;  . CYSTOSCOPY/URETEROSCOPY/HOLMIUM LASER/STENT PLACEMENT Bilateral 11/07/2016   Procedure: CYSTOSCOPY/URETEROSCOPY/HOLMIUM LASER/STENT PLACEMENT;  Surgeon: Nickie Retort, MD;  Location[facility];  Service: Urology;  Laterality: Bilateral;  . CYSTOSCOPY/URETEROSCOPY/HOLMIUM LASER/STENT PLACEMENT Bilateral 11/23/2016   Procedure: CYSTOSCOPY/BILATERAL URETEROSCOPY/HOLMIUM LASER/STENT EXCHANGE;  Surgeon: Nickie Retort, MD;  Location: [facility];  Service: Urology;  Laterality: Bilateral;  NEEDS 90 MIN   . EXTERNAL FIXATION LEG Left 02/22/2017   Procedure: EXTERNAL FIXATION LEFT ANKLE;  Surgeon: Renette Butters, MD;  Location: [place];  Service: Orthopedics;  Laterality: Left;  . HEMORRHOID BANDING  1970s  . INGUINAL HERNIA REPAIR Left 1962  . [place]   "cut me open"  . LEFT HEART CATH AND CORS/GRAFTS ANGIOGRAPHY N/A 10/25/2018   Procedure: LEFT HEART CATH AND CORS/GRAFTS ANGIOGRAPHY;  Surgeon: Leonie Man, MD;  Location: [place] [facility];  Service: Cardiovascular;  Laterality: N/A;  . POSTERIOR FUSION CERVICAL SPINE  2006  . TEE WITHOUT CARDIOVERSION N/A 03/30/2015   Procedure: TRANSESOPHAGEAL ECHOCARDIOGRAM (TEE);  Surgeon: Grace Isaac, MD;  Location: [place];  Service: Open Heart Surgery;  Laterality: N/A;  . TESTICLE TORSION REDUCTION Right 1960  . TOE FUSION Left 1985 & 2004   "great toe" and removal  . TONSILLECTOMY    . TOTAL HIP ARTHROPLASTY Right 10/27/2015   Procedure: RIGHT TOTAL HIP ARTHROPLASTY ANTERIOR APPROACH and steroid injection right foot;  Surgeon: Mcarthur Rossetti, MD;  Location: [place];  Service: Orthopedics;  Laterality: Right;  . TOTAL HIP ARTHROPLASTY Left 08/13/2019  . TOTAL HIP ARTHROPLASTY Left 08/13/2019   Procedure: LEFT TOTAL HIP ARTHROPLASTY ANTERIOR  APPROACH;  Surgeon: Mcarthur Rossetti, MD;  Location: [place];  Service: Orthopedics;  Laterality: Left;    reports that he quit smoking about 30 years ago. His smoking use included cigarettes. He has a 34.00 pack-year smoking history. He has never used smokeless tobacco. He reports current alcohol use of about 3.0 standard drinks of alcohol per week. He reports that he does not use drugs. family history includes Alcohol abuse in an other family member; Arthritis in an other family member; Diabetes in his mother and paternal grandfather; Drug abuse in his sister; Healthy in his brother, daughter, and son; Heart disease in his father, maternal grandfather, maternal grandmother, mother, and paternal grandfather; Hypertension in his father, maternal  grandfather, maternal grandmother, mother, and paternal grandfather; Prostate cancer in his maternal uncle; Tremor in his sister. Allergies  Allergen Reactions  . Acyclovir And Related Other (See Comments)    Accumulates and causes stroke-like side-effects due to cytochrome P450 enzyme deficiency  . Benzodiazepines Other (See Comments)    Patient states he gets stroke symptoms with benzo's.   . Compazine [Prochlorperazine Maleate] Other (See Comments)    Accumulates and causes stroke-like side-effects due to cytochrome P450 enzyme deficiency  . Coreg [Carvedilol] Other (See Comments)    Accumulates and causes stroke-like side-effects due to cytochrome P450 enzyme deficiency Patient poor metabolizer of CYP2D6 - Coreg undergoes extensive hepatic (including 2D6)  . Flomax [Tamsulosin Hcl] Other (See Comments)    Accumulates and causes stroke-like side-effects due to cytochrome P450 enzyme deficiency  . Lisinopril Other (See Comments)    Hyperkalemia. 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  . Mobic [Meloxicam] Other (See Comments)    Accumulates and causes stroke-like side-effects due to cytochrome P450  enzyme deficiency  . Ondansetron Other (See Comments)    Accumulates and causes stroke-like side-effects due to cytochrome P450 enzyme deficiency   . Oxycodone Other (See Comments)    Accumulates and causes stroke-like side-effects due to cytochrome P450 enzyme deficiency  . Promethazine Other (See Comments)    Accumulates and causes stroke-like side-effects due to cytochrome P450 enzyme deficiency  . Sertraline Hcl Other (See Comments)    Accumulates and causes stroke-like side-effects due to cytochrome P450 enzyme deficiency  . Tramadol Other (See Comments)    Accumulates and causes stroke-like side-effects due to cytochrome P450 enzyme deficiency  . Atorvastatin Other (See Comments)    MYALGIAS  . Colchicine Other (See Comments)    ataxia   Current Outpatient Medications on File Prior to Visit  Medication Sig Dispense Refill  . allopurinol (ZYLOPRIM) 300 MG tablet TAKE 1 TABLET BY MOUTH EVERY DAY 90 tablet 1  . Ascorbic Acid (VITAMIN C) 1000 MG tablet Take 1,000 mg by mouth 2 (two) times daily.     [name] aspirin EC 81 MG tablet Take 81 mg by mouth daily. Swallow whole.    . calcium carbonate (TUMS EX) 750 MG chewable tablet Chew 1 tablet by mouth daily as needed for heartburn.     . Carboxymethylcellulose Sodium (LUBRICANT EYE DROPS OP) Place 1 drop into both eyes 2 (two) times daily as needed (dry/irritated eyes.).     [name] Cholecalciferol (VITAMIN D-3) 125 MCG (5000 UT) TABS Take 5,000 Units by mouth 2 (two) times a day.    . famotidine (PEPCID) 40 MG tablet TAKE 1 TABLET BY MOUTH EVERY DAY. MUST SEE PROVIDER FOR FUTURE REFILLS 90 tablet 2  . furosemide (LASIX) 40 MG tablet TAKE 1 TABLET BY MOUTH EVERY OTHER DAY 45 tablet 2  . levothyroxine (SYNTHROID) 25 MCG tablet TAKE 1 TABLET BY MOUTH EVERY DAY BEFORE BREAKFAST 30 tablet 0  . nitroGLYCERIN (NITROSTAT) 0.4 MG SL tablet PLACE 1 TABLET UNDER THE TONGUE EVERY 5 MINUTES AS NEEDED FOR CHEST PAIN. (Patient taking differently: Place 0.4 mg  under the tongue every 5 (five) minutes as needed for chest pain.) 25 tablet 3  . primidone (MYSOLINE) 250 MG tablet Take 1 tablet (250 mg total) by mouth in the morning and at bedtime. 180 tablet 1  . rosuvastatin (CRESTOR) 10 MG tablet TAKE 1/2 TABLET BY MOUTH EVERY DAY 45 tablet 1  . Turmeric 500 MG CAPS Take 500 mg by mouth daily.     [name]  vitamin B-12 (CYANOCOBALAMIN) 1000 MCG tablet Take 1,000 mcg by mouth daily.     [name] amLODipine (NORVASC) 10 MG tablet TAKE 1 TABLET BY MOUTH EVERY DAY 90 tablet 1  . [DISCONTINUED] ranitidine (ZANTAC) 150 MG tablet TAKE 1 TABLET BY MOUTH 2 TIMES DAILY (Patient taking differently: Take 150 mg by mouth 2 (two) times a day. ) 180 tablet 0   Current Facility-Administered Medications on File Prior to Visit  Medication Dose Route Frequency Provider Last Rate Last Admin  . sodium chloride flush (NS) 0.9 % injection 3 mL  3 mL Intravenous Q12H Meng, Morgan, PA            ROS:  All others reviewed and negative.  Objective        PE:  BP (!) 166/88 (BP Location: Right Arm, Patient Position: Sitting, Cuff Size: Large)   Pulse 62   Temp 98.6 F (37 C) (Oral)   Ht 5\' 8"  (1.727 m)   Wt 218 lb (98.9 kg)   SpO2 97%   BMI 33.15 kg/m                 Constitutional: Pt appears in NAD               HENT: Head: NCAT.                Right Ear: External ear normal.                 Left Ear: External ear normal.                Eyes: . Pupils are equal, round, and reactive to light. Conjunctivae and EOM are normal               Nose: without d/c or deformity               Neck: Neck supple. Gross normal ROM               Cardiovascular: Normal rate and regular rhythm.                 Pulmonary/Chest: Effort normal and breath sounds without rales or wheezing.                Abd:  Soft, mild tender costal margin and RUQ, ND, + BS, no organomegaly               Neurological: Pt is alert. At baseline orientation, motor grossly intact               Skin: Skin is warm. No  rashes, no other new lesions, LE edema - none               Psychiatric: Pt behavior is normal without agitation   Assessment/Plan:  FLAKE PERSINGER is a 80 y.o. White or Caucasian [1] male with  has a past medical history of Allergy, Anxiety, Aortic stenosis, moderate (03/27/2015), Arthritis, Cancer ([place]), Carotid artery stenosis, Cervical stenosis of spinal canal, CHF (congestive heart failure) ([place]), Closed left pilon fracture, COLONIC POLYPS, HX OF, Complication of anesthesia, Coronary artery disease, DIVERTICULOSIS, COLON, Dysrhythmia, Familial tremor (03/19/2014), Family history of adverse reaction to anesthesia, GERD (gastroesophageal reflux disease), GOUT, Headache, Heart murmur, High cholesterol, History of blood transfusion, History of kidney stones, HYPERTENSION, Hypothyroidism, Myocardial infarction ([place]), OSA on CPAP, Pneumonia, 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  Assessment Plan  See notes Labs reviewed for each problem: Lab Results  Component Value Date   WBC 7.5 06/10/2020   HGB 15.1 06/10/2020   HCT 44.9 06/10/2020   PLT 148.0 (L) 06/10/2020   GLUCOSE 91 06/10/2020   CHOL 126 06/10/2020   TRIG 156.0 (H) 06/10/2020   HDL 40.20 06/10/2020   LDLDIRECT 63.0 12/04/2019   LDLCALC 55 06/10/2020   ALT 22 06/10/2020   AST 16 06/10/2020   NA 140 06/10/2020   K 3.9 06/10/2020   CL 103 06/10/2020   CREATININE 1.01 06/10/2020   BUN 19 06/10/2020   CO2 31 06/10/2020   TSH 2.81 06/10/2020   PSA 1.49 06/10/2020   INR 1.47 03/30/2015   HGBA1C 6.0 06/10/2020   MICROALBUR 8.8 (H) 06/10/2020    Micro: none  Cardiac tracings I have personally interpreted today:  none  Pertinent Radiological findings (summarize): none    There are no preventive care reminders to display for this patient.  There are no preventive care reminders to display for this  patient.  Lab Results  Component Value Date   TSH 2.81 06/10/2020   Lab Results  Component Value Date   WBC 7.5 06/10/2020   HGB 15.1 06/10/2020   HCT 44.9 06/10/2020   MCV 93.4 06/10/2020   PLT 148.0 (L) 06/10/2020   Lab Results  Component Value Date   NA 140 06/10/2020   K 3.9 06/10/2020   CO2 31 06/10/2020   GLUCOSE 91 06/10/2020   BUN 19 06/10/2020   CREATININE 1.01 06/10/2020   BILITOT 0.8 06/10/2020   ALKPHOS 54 06/10/2020   AST 16 06/10/2020   ALT 22 06/10/2020   PROT 6.4 06/10/2020   ALBUMIN 4.4 06/10/2020   CALCIUM 9.1 06/10/2020   ANIONGAP 10 08/14/2019   GFR 71.76 06/10/2020   Lab Results  Component Value Date   CHOL 126 06/10/2020   Lab Results  Component Value Date   HDL 40.20 06/10/2020   Lab Results  Component Value Date   LDLCALC 55 06/10/2020   Lab Results  Component Value Date   TRIG 156.0 (H) 06/10/2020   Lab Results  Component Value Date   CHOLHDL 3 06/10/2020   Lab Results  Component Value Date   HGBA1C 6.0 06/10/2020      Assessment & Plan:   Problem List Items Addressed This Visit      High   RUQ pain    Etiology unclear, ? Msk, but cant r/o GB - for abd u/s      Relevant Orders   Lipase (Completed)   US Abdomen Complete   Encounter for well adult exam with abnormal findings - Primary    Overall doing well, age appropriate education and counseling updated, referrals for preventative services and immunizations addressed, dietary and smoking counseling addressed, most recent labs reviewed.  I have personally reviewed and have noted:  1) the patient's medical and social history 2) The pt's use of alcohol, tobacco, and illicit drugs 3) The patient's current medications and supplements 4) Functional ability including ADL's, fall risk, home safety risk, hearing and visual impairment 5) Diet and physical activities 6) Evidence for depression or mood disorder 7) The patient's height, weight, and BMI have been recorded in  the chart  I have made referrals, and provided counseling and education based on review of the above       Relevant Orders   PSA (Completed)     Medium   Hypothyroidism    Lab  Results  Component Value Date   TSH 2.81 06/10/2020   Stable, pt to continue levothyroxine  Current Outpatient Medications (Endocrine & Metabolic):  .  levothyroxine (SYNTHROID) 25 MCG tablet, TAKE 1 TABLET BY MOUTH EVERY DAY BEFORE BREAKFAST   Current Outpatient Medications (Cardiovascular):  .  furosemide (LASIX) 40 MG tablet, TAKE 1 TABLET BY MOUTH EVERY OTHER DAY .  nitroGLYCERIN (NITROSTAT) 0.4 MG SL tablet, PLACE 1 TABLET UNDER THE TONGUE EVERY 5 MINUTES AS NEEDED FOR CHEST PAIN. (Patient taking differently: Place 0.4 mg under the tongue every 5 (five) minutes as needed for chest pain.) .  rosuvastatin (CRESTOR) 10 MG tablet, TAKE 1/2 TABLET BY MOUTH EVERY DAY .  amLODipine (NORVASC) 10 MG tablet, TAKE 1 TABLET BY MOUTH EVERY DAY     Current Outpatient Medications (Analgesics):  .  allopurinol (ZYLOPRIM) 300 MG tablet, TAKE 1 TABLET BY MOUTH EVERY DAY .  aspirin EC 81 MG tablet, Take 81 mg by mouth daily. Swallow whole.   Current Outpatient Medications (Hematological):  .  vitamin B-12 (CYANOCOBALAMIN) 1000 MCG tablet, Take 1,000 mcg by mouth daily.    Current Outpatient Medications (Other):  [name]  Ascorbic Acid (VITAMIN C) 1000 MG tablet, Take 1,000 mg by mouth 2 (two) times daily.  .  calcium carbonate (TUMS EX) 750 MG chewable tablet, Chew 1 tablet by mouth daily as needed for heartburn.  .  Carboxymethylcellulose Sodium (LUBRICANT EYE DROPS OP), Place 1 drop into both eyes 2 (two) times daily as needed (dry/irritated eyes.).  [name]  Cholecalciferol (VITAMIN D-3) 125 MCG (5000 UT) TABS, Take 5,000 Units by mouth 2 (two) times a day. .  famotidine (PEPCID) 40 MG tablet, TAKE 1 TABLET BY MOUTH EVERY DAY. MUST SEE PROVIDER FOR FUTURE REFILLS .  primidone (MYSOLINE) 250 MG tablet, Take 1 tablet (250  mg total) by mouth in the morning and at bedtime. .  Turmeric 500 MG CAPS, Take 500 mg by mouth daily.   Current Facility-Administered Medications (Other):  .  sodium chloride flush (NS) 0.9 % injection 3 mL      Essential hypertension    BP Readings from Last 3 Encounters:  06/10/20 (!) 166/88  02/07/20 (!) 182/78  12/17/19 (!) 171/75   Stable, pt to continue medical treatment amlodipine      Diet-controlled diabetes mellitus (HCC) (Chronic)    Lab Results  Component Value Date   HGBA1C 6.0 06/10/2020   Stable, pt to continue current medical treatment - diet       Relevant Orders   Microalbumin / creatinine urine ratio (Completed)   Hemoglobin A1c (Completed)   Lipid panel (Completed)   Hepatic function panel (Completed)   CBC with Differential/Platelet (Completed)   TSH (Completed)   Urinalysis, Routine w reflex microscopic (Completed)   Basic metabolic panel (Completed)    Other Visit Diagnoses    B12 deficiency       Relevant Orders   Vitamin B12 (Completed)   Vitamin D deficiency       Relevant Orders   VITAMIN D 25 Hydroxy (Vit-D Deficiency, Fractures) (Completed)      No orders of the defined types were placed in this encounter.   Follow-up: Return in about 6 months (around 12/08/2020).    Oliver Barre, MD 06/10/2020 2:49 PM [facility]

## 2020-06-11 ENCOUNTER — Telehealth: Payer: Self-pay | Admitting: Internal Medicine

## 2020-06-11 ENCOUNTER — Encounter: Payer: Self-pay | Admitting: Internal Medicine

## 2020-06-11 DIAGNOSIS — R1011 Right upper quadrant pain: Secondary | ICD-10-CM

## 2020-06-11 LAB — URINALYSIS, ROUTINE W REFLEX MICROSCOPIC
Bilirubin Urine: NEGATIVE
Hgb urine dipstick: NEGATIVE
Ketones, ur: NEGATIVE
Leukocytes,Ua: NEGATIVE
Nitrite: NEGATIVE
Specific Gravity, Urine: 1.015 (ref 1.000–1.030)
Total Protein, Urine: NEGATIVE
Urine Glucose: NEGATIVE
Urobilinogen, UA: 1 (ref 0.0–1.0)
pH: 7 (ref 5.0–8.0)

## 2020-06-11 NOTE — Telephone Encounter (Signed)
Please to call wife  bc the u/s is not needed until mon or tues next wk, I am forced to order it as "routine" and therefore lost control of being able to get it done sooner than later  We will still try for mon or tues next wk per scheduling

## 2020-06-12 DIAGNOSIS — Z20822 Contact with and (suspected) exposure to covid-19: Secondary | ICD-10-CM | POA: Diagnosis not present

## 2020-06-16 ENCOUNTER — Ambulatory Visit (HOSPITAL_COMMUNITY)
Admission: RE | Admit: 2020-06-16 | Discharge: 2020-06-16 | Disposition: A | Discharge status: Home / Self Care | Payer: Medicare Other | Source: Ambulatory Visit | Attending: Internal Medicine | Admitting: Internal Medicine

## 2020-06-16 ENCOUNTER — Encounter: Payer: Self-pay | Admitting: Internal Medicine

## 2020-06-16 ENCOUNTER — Other Ambulatory Visit: Payer: Self-pay

## 2020-06-16 DIAGNOSIS — K8020 Calculus of gallbladder without cholecystitis without obstruction: Secondary | ICD-10-CM | POA: Diagnosis not present

## 2020-06-16 DIAGNOSIS — R1011 Right upper quadrant pain: Secondary | ICD-10-CM | POA: Insufficient documentation

## 2020-06-17 DIAGNOSIS — Z01818 Encounter for other preprocedural examination: Secondary | ICD-10-CM | POA: Diagnosis not present

## 2020-06-17 DIAGNOSIS — I4891 Unspecified atrial fibrillation: Secondary | ICD-10-CM | POA: Diagnosis not present

## 2020-06-17 DIAGNOSIS — G250 Essential tremor: Secondary | ICD-10-CM | POA: Diagnosis not present

## 2020-06-18 ENCOUNTER — Ambulatory Visit: Payer: Medicare Other | Admitting: Neurology

## 2020-06-18 DIAGNOSIS — G250 Essential tremor: Secondary | ICD-10-CM | POA: Diagnosis not present

## 2020-06-18 DIAGNOSIS — I4891 Unspecified atrial fibrillation: Secondary | ICD-10-CM | POA: Diagnosis not present

## 2020-06-18 DIAGNOSIS — Z01818 Encounter for other preprocedural examination: Secondary | ICD-10-CM | POA: Diagnosis not present

## 2020-06-19 ENCOUNTER — Telehealth: Payer: Self-pay | Admitting: Neurology

## 2020-06-19 DIAGNOSIS — R251 Tremor, unspecified: Secondary | ICD-10-CM | POA: Diagnosis not present

## 2020-06-19 NOTE — Telephone Encounter (Signed)
Attempted to return call to get more information but voice mail box was full, unable to leave a message.

## 2020-06-19 NOTE — Telephone Encounter (Signed)
So they went to [facility] for tremor management but [facility] only wanted to do surgery but not manage his tremor meds following the surgery that they did?  That is their responsibility.

## 2020-06-19 NOTE — Telephone Encounter (Signed)
Patient's contact, Troy Banks, called and left a message requesting a call back from a nurse. She has a question related to medications.

## 2020-06-19 NOTE — Telephone Encounter (Signed)
Spoke with patients wife and gave her Dr Doristine Devoid recommendations.   She stated she asked Dr Hillery Jacks and he advised her that Dr [name] would be managing the medication.

## 2020-06-19 NOTE — Telephone Encounter (Signed)
Spoke with patient's wife who states the patient has completed his ultrasound at [facility]. She states the Dr Hillery Jacks does not want to prescribe the patient any medication and referred the patient to contact our office.   She states the patient needs a refill on primidone and will be keeping his follow up appointment in February. She wants to know if Dr [name] will be weaning the patient off of Primidone.

## 2020-06-19 NOTE — Telephone Encounter (Signed)
Okay to refill for now but this doesn't make much sense.  30 day refill with 1 refill

## 2020-06-22 ENCOUNTER — Encounter: Payer: Self-pay | Admitting: Internal Medicine

## 2020-06-22 NOTE — Assessment & Plan Note (Signed)
Lab Results  Component Value Date   TSH 2.81 06/10/2020   Stable, pt to continue levothyroxine  Current Outpatient Medications (Endocrine & Metabolic):  .  levothyroxine (SYNTHROID) 25 MCG tablet, TAKE 1 TABLET BY MOUTH EVERY DAY BEFORE BREAKFAST   Current Outpatient Medications (Cardiovascular):  .  furosemide (LASIX) 40 MG tablet, TAKE 1 TABLET BY MOUTH EVERY OTHER DAY .  nitroGLYCERIN (NITROSTAT) 0.4 MG SL tablet, PLACE 1 TABLET UNDER THE TONGUE EVERY 5 MINUTES AS NEEDED FOR CHEST PAIN. (Patient taking differently: Place 0.4 mg under the tongue every 5 (five) minutes as needed for chest pain.) .  rosuvastatin (CRESTOR) 10 MG tablet, TAKE 1/2 TABLET BY MOUTH EVERY DAY .  amLODipine (NORVASC) 10 MG tablet, TAKE 1 TABLET BY MOUTH EVERY DAY     Current Outpatient Medications (Analgesics):  .  allopurinol (ZYLOPRIM) 300 MG tablet, TAKE 1 TABLET BY MOUTH EVERY DAY .  aspirin EC 81 MG tablet, Take 81 mg by mouth daily. Swallow whole.   Current Outpatient Medications (Hematological):  .  vitamin B-12 (CYANOCOBALAMIN) 1000 MCG tablet, Take 1,000 mcg by mouth daily.    Current Outpatient Medications (Other):  [name]  Ascorbic Acid (VITAMIN C) 1000 MG tablet, Take 1,000 mg by mouth 2 (two) times daily.  .  calcium carbonate (TUMS EX) 750 MG chewable tablet, Chew 1 tablet by mouth daily as needed for heartburn.  .  Carboxymethylcellulose Sodium (LUBRICANT EYE DROPS OP), Place 1 drop into both eyes 2 (two) times daily as needed (dry/irritated eyes.).  [name]  Cholecalciferol (VITAMIN D-3) 125 MCG (5000 UT) TABS, Take 5,000 Units by mouth 2 (two) times a day. .  famotidine (PEPCID) 40 MG tablet, TAKE 1 TABLET BY MOUTH EVERY DAY. MUST SEE PROVIDER FOR FUTURE REFILLS .  primidone (MYSOLINE) 250 MG tablet, Take 1 tablet (250 mg total) by mouth in the morning and at bedtime. .  Turmeric 500 MG CAPS, Take 500 mg by mouth daily.   Current Facility-Administered Medications (Other):  .  sodium chloride  flush (NS) 0.9 % injection 3 mL

## 2020-06-22 NOTE — Assessment & Plan Note (Addendum)
Etiology unclear, ? Msk, but cant r/o GB - for abd u/s

## 2020-06-22 NOTE — Assessment & Plan Note (Signed)

## 2020-06-22 NOTE — Assessment & Plan Note (Signed)
BP Readings from Last 3 Encounters:  06/10/20 (!) 166/88  02/07/20 (!) 182/78  12/17/19 (!) 171/75   Stable, pt to continue medical treatment amlodipine

## 2020-06-25 MED ORDER — PRIMIDONE 250 MG PO TABS
250.0000 mg | ORAL_TABLET | Freq: Two times a day (BID) | ORAL | 1 refills | Status: DC
Start: 2020-06-25 — End: 2020-07-16

## 2020-06-25 NOTE — Telephone Encounter (Signed)
30 day prescription with 1 refill sent

## 2020-06-30 ENCOUNTER — Telehealth: Payer: Self-pay

## 2020-06-30 NOTE — Telephone Encounter (Signed)
Opened in error

## 2020-07-06 ENCOUNTER — Other Ambulatory Visit: Payer: Self-pay | Admitting: Internal Medicine

## 2020-07-06 NOTE — Telephone Encounter (Signed)
Please refill as per office routine med refill policy (all routine meds refilled for 3 mo or monthly per pt preference up to one year from last visit, then month to month grace period for 3 mo, then further med refills will have to be denied)  

## 2020-07-14 NOTE — Progress Notes (Unsigned)
Assessment/Plan:    1.  Essential Tremor  -Status post focused ultrasound to the right VIM at [facility] in January, 2022. I do think he is more imbalanced than he was prior to the surgery, but he thinks that is because of knee pain. He mentioned that he is considering left VIM surgery. I discussed with him that this is currently not FDA approved because of significant balance change associated with bilateral focused ultrasound. However, [facility] may be doing some clinical trials in that regard that I am unfamiliar with.  -Patient given the following weaning schedule for primidone: Week 1: Take primidone, 250 mg, 1 tablet in the morning and half tablet at bed Week 2: Take primidone, 250 mg, half tablet in the morning and half tablet at bedtime week 3: Take primidone, 50 mg, 2 tablets in the morning and 2 tablets in the evening Week 4: Take primidone 50 mg, 2 tablets in the morning and 1 tablet in the evening Week 5: Take primidone 50 mg, 1 tablet in the morning and 1 tablet in the evening Week 6: Take primidone 50 mg, 1 tablet in the morning Week 7: STOP primidone  2.  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 Discussed rebound.  Subjective:   Troy Banks was seen today in follow up for essential tremor.  My previous records were reviewed prior to todays visit.  Outside records reviewed as well.  Patient went to [facility] since last visit and had focused ultrasound completed on June 18, 2020.  He had postop MRI on June 19, 2020 with evidence of right thalamic focused ultrasound lesion.  His only follow-up was a nursing telephone follow-up.  Telephone note did indicate that he had some balance difficulties after surgical intervention, but otherwise did well.  He was told to follow-up here to get  off/moderate medication.  He had significant balance change in first week or two but that is much better.Separately, he did have carotid ultrasounds after our last visit for bruits.  There was 1 to 39% stenosis bilaterally.   Also c/o headache in the R occipital.  "its not a throb."  States its like someone "banged my head."  Sx's x 6 months. Headaches last most of the day. Neuroimaging is neg.  Taking some 600mg  q 8hours.  Will travel behind the R eye.  No nausea/emesis.  No photophobia/phonophobia.  Has a remote hx of migraine but none for many years.  Hasn't taken nitro. They state that BP has generally not been high - "that's white coat syndrome." No vision change. No palpable discomfort  Current prescribed movement disorder medications: Primidone, 250 mg twice per day.   PREVIOUS MEDICATIONS: Not beta-blocker candidate because of bradycardia; not topiramate candidate because of history of nephrolithiasis.  ALLERGIES:   Allergies  Allergen Reactions  . Acyclovir And Related Other (See Comments)    Accumulates and causes stroke-like side-effects due to cytochrome P450 enzyme deficiency  . Benzodiazepines Other (See Comments)    Patient states he gets stroke symptoms with benzo's.   . Compazine [Prochlorperazine Maleate] Other (See Comments)    Accumulates and causes stroke-like side-effects due to cytochrome P450 enzyme deficiency  . 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 Flomax [Tamsulosin Hcl] Other (See Comments)    Accumulates and causes stroke-like side-effects due to cytochrome P450 enzyme deficiency  . Lisinopril Other (See Comments)    Hyperkalemia. 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  . Mobic [Meloxicam] Other (See Comments)    Accumulates and  causes stroke-like side-effects due to cytochrome P450 enzyme deficiency  . Ondansetron Other (See Comments)    Accumulates and causes stroke-like side-effects due to cytochrome P450 enzyme deficiency   . Oxycodone Other (See Comments)    Accumulates and causes stroke-like side-effects due to cytochrome P450 enzyme deficiency  . Promethazine Other (See Comments)    Accumulates and causes stroke-like side-effects due to cytochrome P450 enzyme deficiency  . Sertraline Hcl Other (See Comments)    Accumulates and causes stroke-like side-effects due to cytochrome P450 enzyme deficiency  . Tramadol Other (See Comments)    Accumulates and causes stroke-like side-effects due to cytochrome P450 enzyme deficiency  . Atorvastatin Other (See Comments)    MYALGIAS  . Colchicine Other (See Comments)    ataxia    CURRENT MEDICATIONS:  Outpatient Encounter Medications as of 07/16/2020  Medication Sig  . allopurinol (ZYLOPRIM) 300 MG tablet TAKE 1 TABLET BY MOUTH EVERY DAY  . amLODipine (NORVASC) 10 MG tablet TAKE 1 TABLET BY MOUTH EVERY DAY  . Ascorbic Acid (VITAMIN C) 1000 MG tablet Take 1,000 mg by mouth 2 (two) times daily.   [name] aspirin EC 81 MG tablet Take 81 mg by mouth daily. Swallow whole.  . calcium carbonate (TUMS EX) 750 MG chewable tablet Chew 1 tablet by mouth daily as needed for heartburn.   . Carboxymethylcellulose Sodium (LUBRICANT EYE DROPS OP) Place 1 drop into both eyes 2 (two) times daily as needed (dry/irritated eyes.).   [name] Cholecalciferol (VITAMIN D-3) 125 MCG (5000 UT) TABS Take 5,000 Units by mouth 2 (two) times a day.  . famotidine (PEPCID) 40 MG tablet TAKE 1 TABLET BY MOUTH EVERY DAY. MUST SEE PROVIDER FOR FUTURE REFILLS  . furosemide (LASIX) 40 MG tablet TAKE 1 TABLET BY MOUTH EVERY OTHER DAY  . levothyroxine (SYNTHROID) 25 MCG tablet TAKE 1 TABLET BY MOUTH EVERY DAY WITH BREAKFAST  . nitroGLYCERIN (NITROSTAT) 0.4 MG SL tablet PLACE 1 TABLET UNDER THE TONGUE EVERY 5 MINUTES AS  NEEDED FOR CHEST PAIN. (Patient taking differently: Place 0.4 mg under the tongue every 5 (five) minutes as needed for chest pain.)  . primidone (MYSOLINE) 250 MG tablet Take 1 tablet (250 mg total) by mouth in the morning and at bedtime.  . rosuvastatin (CRESTOR) 10 MG tablet TAKE 1/2 TABLET BY MOUTH EVERY DAY  . Turmeric 500 MG CAPS Take 500 mg by mouth daily.   . vitamin B-12 (CYANOCOBALAMIN) 1000 MCG tablet Take 1,000 mcg by mouth daily.   . [DISCONTINUED] ranitidine (ZANTAC) 150 MG tablet TAKE 1 TABLET BY MOUTH 2 TIMES DAILY (Patient taking differently: Take 150 mg by mouth 2 (two) times a day. )   Facility-Administered Encounter Medications as of 07/16/2020  Medication  . sodium chloride flush (NS) 0.9 % injection 3 mL     Objective:    PHYSICAL EXAMINATION:    VITALS:   Vitals:   07/16/20 1525  BP: (!) 164/76  Pulse: 60  SpO2: 98%  Weight: 217 lb (98.4 kg)  Height: 5' 8.5" (1.74 m)    GEN:  The patient appears stated  age and is in NAD. HEENT:  Normocephalic, atraumatic.  The mucous membranes are moist. The superficial temporal arteries are without ropiness or tenderness. CV:  RRR Lungs:  CTAB Neck/HEME:  There are no carotid bruits bilaterally. No occipital notch tenderness.  Neurological examination:  Orientation: The patient is alert and oriented x3. Cranial nerves: There is good facial symmetry. The speech is fluent and clear. Soft palate rises symmetrically and there is no tongue deviation. Hearing is intact to conversational tone. Sensation: Sensation is intact to light touch throughout Motor: Strength is at least antigravity x4.  Movement examination: Tone: There is normal tone in the UE/LE Abnormal movements: There is no rest or intention tremor in the left hand. There is intention tremor on the right. Archimedes spirals are drawn fairly well on the left but he still has tremor on the right, but he is able to draw the circle is okay. He is able to write his  name fairly well (left hand dominant) Coordination:  There is no decremation with RAM's,  Gait and Station: The patient is wide-based and slightly unbalanced (he attributes this to knee pain) I have reviewed and interpreted the following labs independently   Chemistry      Component Value Date/Time   NA 140 06/10/2020 1524   NA 143 10/19/2018 1411   K 3.9 06/10/2020 1524   CL 103 06/10/2020 1524   CO2 31 06/10/2020 1524   BUN 19 06/10/2020 1524   BUN 17 10/19/2018 1411   CREATININE 1.01 06/10/2020 1524   CREATININE 0.94 03/04/2016 0918      Component Value Date/Time   CALCIUM 9.1 06/10/2020 1524   ALKPHOS 54 06/10/2020 1524   AST 16 06/10/2020 1524   ALT 22 06/10/2020 1524   BILITOT 0.8 06/10/2020 1524      Lab Results  Component Value Date   WBC 7.5 06/10/2020   HGB 15.1 06/10/2020   HCT 44.9 06/10/2020   MCV 93.4 06/10/2020   PLT 148.0 (L) 06/10/2020   Lab Results  Component Value Date   TSH 2.81 06/10/2020     Chemistry      Component Value Date/Time   NA 140 06/10/2020 1524   NA 143 10/19/2018 1411   K 3.9 06/10/2020 1524   CL 103 06/10/2020 1524   CO2 31 06/10/2020 1524   BUN 19 06/10/2020 1524   BUN 17 10/19/2018 1411   CREATININE 1.01 06/10/2020 1524   CREATININE 0.94 03/04/2016 0918      Component Value Date/Time   CALCIUM 9.1 06/10/2020 1524   ALKPHOS 54 06/10/2020 1524   AST 16 06/10/2020 1524   ALT 22 06/10/2020 1524   BILITOT 0.8 06/10/2020 1524      Lab Results  Component Value Date   ESRSEDRATE 16 06/10/2015      Total time spent on today's visit was 30 minutes, including both face-to-face time and nonface-to-face time.  Time included that spent on review of records (prior notes available to me/labs/imaging if pertinent), discussing treatment and goals, answering patient's questions and coordinating care.  Cc:  [name], MD

## 2020-07-14 NOTE — Patient Instructions (Signed)
Week 1: Take primidone, 250 mg, 1 tablet in the morning and half tablet at bed Week 2: Take primidone, 250 mg, half tablet in the morning and half tablet at bedtime week 3: Take primidone, 50 mg, 2 tablets in the morning and 2 tablets in the evening Week 4: Take primidone 50 mg, 2 tablets in the morning and 1 tablet in the evening Week 5: Take primidone 50 mg, 1 tablet in the morning and 1 tablet in the evening Week 6: Take primidone 50 mg, 1 tablet in the morning Week 7: STOP primidone

## 2020-07-16 ENCOUNTER — Encounter: Payer: Self-pay | Admitting: Neurology

## 2020-07-16 ENCOUNTER — Ambulatory Visit: Payer: Medicare Other | Admitting: Neurology

## 2020-07-16 ENCOUNTER — Other Ambulatory Visit: Payer: Self-pay

## 2020-07-16 VITALS — BP 164/76 | HR 60 | Ht 68.5 in | Wt 217.0 lb

## 2020-07-16 DIAGNOSIS — G250 Essential tremor: Secondary | ICD-10-CM

## 2020-07-16 DIAGNOSIS — G43719 Chronic migraine without aura, intractable, without status migrainosus: Secondary | ICD-10-CM | POA: Diagnosis not present

## 2020-07-16 MED ORDER — PRIMIDONE 50 MG PO TABS: ORAL_TABLET | ORAL | 0 refills | Status: DC

## 2020-07-17 ENCOUNTER — Telehealth: Payer: Self-pay

## 2020-07-17 NOTE — Telephone Encounter (Signed)
Spoke with patients girlfriend made aware of patient needing to come to the office next week to have labs. Advised girlfriend to contact the office on their way here so our front staff can check in the patient. Also gave her instructions to locate the lab. She voiced understanding.

## 2020-07-17 NOTE — Addendum Note (Signed)
Addended by: Ulice Brilliant T on: 07/17/2020 03:04 PM   Modules accepted: Orders

## 2020-07-22 ENCOUNTER — Other Ambulatory Visit (INDEPENDENT_AMBULATORY_CARE_PROVIDER_SITE_OTHER): Payer: Medicare Other

## 2020-07-22 ENCOUNTER — Other Ambulatory Visit: Payer: Self-pay

## 2020-07-22 DIAGNOSIS — G43719 Chronic migraine without aura, intractable, without status migrainosus: Secondary | ICD-10-CM | POA: Diagnosis not present

## 2020-07-22 LAB — SEDIMENTATION RATE: Sed Rate: 4 mm/hr (ref 0–20)

## 2020-08-03 NOTE — Progress Notes (Unsigned)
08/03/2020 ZALMEN WRIGHTSMAN   10-Dec-1940  [id number]  Primary Physician [name], MD Primary Cardiologist: Dr [name]  HPI:  80 y.o. Male seen for follow up CAD. He was admitted 10/19-10/31/16 with a non-STEMI. LHC demonstrated 3 vessel CAD. He also had significant aortic stenosis.  He underwent CABG with LIMA-LAD, SVG-intermediate, SVG-LCx, SVG-distal RCA and bioprosthetic AVR. Atenolol was initiated but later stopped secondary to bradycardia. He did develop atrial fibrillation. He was placed on amiodarone with return to NSR.  He has an essential tremor and is on Primidone chronically. 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 Amiodarone was stopped in January 2017 at the time he was hospitalized for an acute gout attack in his [place]. He has maintained NSR.  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 He has a daughter who is a family physician in [place].  In May 2018 he complained of more dyspnea on exertion and some chest tightness.  CXR was clear. Myoview study was normal.  In June 2018 he underwent cystoscopy and lithotripsy for renal stones.   In September 2018 he fell off his roof fracturing his left ankle and had a compression fracture of L2. He required extensive surgery of his ankle.   In May 2020 he presented with symptoms of increased DOE. He underwent cardiac cath showing all grafts were patent. Echo showed normal EF with evidence of diastolic dysfunction. The AV prothesis function had not changed. He had mild pulmonary HTN. His diuretic dose was increased.  He did undergo left THR in March. Carotid dopplers in July looked good.   On follow up today he is doing well. He does have white coat syndrome. Usually BP at home is 140/70-80 His wife confirms this. His breathing is doing well. He denies any chest pain or palpitations. He did have cranial [place] treatment at [facility] for tremor.    Current Outpatient  Medications  Medication Sig Dispense Refill  . allopurinol (ZYLOPRIM) 300 MG tablet TAKE 1 TABLET BY MOUTH EVERY DAY 90 tablet 1  . amLODipine (NORVASC) 10 MG tablet TAKE 1 TABLET BY MOUTH EVERY DAY 90 tablet 1  . Ascorbic Acid (VITAMIN C) 1000 MG tablet Take 1,000 mg by mouth 2 (two) times daily.     [name] aspirin EC 81 MG tablet Take 81 mg by mouth daily. Swallow whole.    . calcium carbonate (TUMS EX) 750 MG chewable tablet Chew 1 tablet by mouth daily as needed for heartburn.     . Carboxymethylcellulose Sodium (LUBRICANT EYE DROPS OP) Place 1 drop into both eyes 2 (two) times daily as needed (dry/irritated eyes.).     [name] Cholecalciferol (VITAMIN D-3) 125 MCG (5000 UT) TABS Take 5,000 Units by mouth 2 (two) times a day.    . famotidine (PEPCID) 40 MG tablet TAKE 1 TABLET BY MOUTH EVERY DAY. MUST SEE PROVIDER FOR FUTURE REFILLS 90 tablet 2  . furosemide (LASIX) 40 MG tablet TAKE 1 TABLET BY MOUTH EVERY OTHER DAY 45 tablet 2  . levothyroxine (SYNTHROID) 25 MCG tablet TAKE 1 TABLET BY MOUTH EVERY DAY WITH BREAKFAST 90 tablet 3  . nitroGLYCERIN (NITROSTAT) 0.4 MG SL tablet PLACE 1 TABLET UNDER THE TONGUE EVERY 5 MINUTES AS NEEDED FOR CHEST PAIN. (Patient taking differently: Place 0.4 mg under the tongue every 5 (five) minutes as needed for chest pain.) 25 tablet 3  . primidone (MYSOLINE) 50 MG tablet Take as directed  on AVS 158 tablet 0  . rosuvastatin (CRESTOR) 10 MG tablet TAKE 1/2 TABLET BY MOUTH EVERY DAY 45 tablet 1  . Turmeric 500 MG CAPS Take 500 mg by mouth daily.     . vitamin B-12 (CYANOCOBALAMIN) 1000 MCG tablet Take 1,000 mcg by mouth daily.      Current Facility-Administered Medications  Medication Dose Route Frequency Provider Last Rate Last Admin  . sodium chloride flush (NS) 0.9 % injection 3 mL  3 mL Intravenous Q12H Almyra Deforest, PA        Allergies  Allergen Reactions  . Acyclovir And Related Other (See Comments)    Accumulates and causes stroke-like side-effects due to  cytochrome P450 enzyme deficiency  . Benzodiazepines Other (See Comments)    Patient states he gets stroke symptoms with benzo's.   . Compazine [Prochlorperazine Maleate] Other (See Comments)    Accumulates and causes stroke-like side-effects due to cytochrome P450 enzyme deficiency  . Coreg [Carvedilol] Other (See Comments)    Accumulates and causes stroke-like side-effects due to cytochrome P450 enzyme deficiency Patient poor metabolizer of CYP2D6 - Coreg undergoes extensive hepatic (including 2D6)  . Flomax [Tamsulosin Hcl] Other (See Comments)    Accumulates and causes stroke-like side-effects due to cytochrome P450 enzyme deficiency  . Lisinopril Other (See Comments)    Hyperkalemia. 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  . Mobic [Meloxicam] Other (See Comments)    Accumulates and causes stroke-like side-effects due to cytochrome P450 enzyme deficiency  . Ondansetron Other (See Comments)    Accumulates and causes stroke-like side-effects due to cytochrome P450 enzyme deficiency   . Oxycodone Other (See Comments)    Accumulates and causes stroke-like side-effects due to cytochrome P450 enzyme deficiency  . Promethazine Other (See Comments)    Accumulates and causes stroke-like side-effects due to cytochrome P450 enzyme deficiency  . Sertraline Hcl Other (See Comments)    Accumulates and causes stroke-like side-effects due to cytochrome P450 enzyme deficiency  . Tramadol Other (See Comments)    Accumulates and causes stroke-like side-effects due to cytochrome P450 enzyme deficiency  . Atorvastatin Other (See Comments)    MYALGIAS  . Colchicine Other (See Comments)    ataxia    Social History   Socioeconomic History  . Marital status: Married    Spouse name: Karmen Bongo, RN  . Number of children: 2  . Years of education: Not on file  . Highest education level: Some college, no degree  Occupational History  . Occupation:  Retired Animal nutritionist  Tobacco Use  . Smoking status: Former Smoker    Packs/day: 1.00    Years: 34.00    Pack years: 34.00    Types: Cigarettes    Quit date: 06/06/1990    Years since quitting: 30.1  . Smokeless tobacco: Never Used  Vaping Use  . Vaping Use: Never used  Substance and Sexual Activity  . Alcohol use: Yes    Alcohol/week: 3.0 standard drinks    Types: 1 Cans of beer, 2 Shots of liquor per week    Comment: occassional  . Drug use: No  . Sexual activity: Yes  Other Topics Concern  . Not on file  Social History Narrative  . Not on file   Social Determinants of Health   Financial Resource Strain: Not on file  Food Insecurity: Not on file  Transportation Needs: Not on file  Physical Activity: Not on file  Stress: Not on file  Social Connections: Not  on file  Intimate Partner Violence: Not on file     Review of Systems: As noted in HPI All other systems reviewed and are otherwise negative except as noted above.    There were no vitals taken for this visit.  GENERAL:  Well appearing WM in NAD HEENT:  PERRL, EOMI, sclera are clear. Oropharynx is clear. NECK:  No jugular venous distention, carotid upstroke brisk and symmetric, no bruits, no thyromegaly or adenopathy LUNGS:  Clear to auscultation bilaterally CHEST:  Unremarkable, old sternotomy scar HEART:  RRR,  PMI not displaced or sustained,S1 and S2 within normal limits, no S3, no S4: no clicks, no rubs, gr 2/6 systolic murmur RUSB.  ABD:  Soft, nontender. BS +, no masses or bruits. No hepatomegaly, no splenomegaly EXT:  2 + pulses throughout, Tr edema on left- left calf is larger than right., no cyanosis no clubbing SKIN:  Warm and dry.  No rashes NEURO:  Alert and oriented x 3. Cranial nerves II through XII intact. PSYCH:  Cognitively intact    Lab Results  Component Value Date   WBC 7.5 06/10/2020   HGB 15.1 06/10/2020   HCT 44.9 06/10/2020   PLT 148.0 (L) 06/10/2020   GLUCOSE 91  06/10/2020   CHOL 126 06/10/2020   TRIG 156.0 (H) 06/10/2020   HDL 40.20 06/10/2020   LDLDIRECT 63.0 12/04/2019   LDLCALC 55 06/10/2020   ALT 22 06/10/2020   AST 16 06/10/2020   NA 140 06/10/2020   K 3.9 06/10/2020   CL 103 06/10/2020   CREATININE 1.01 06/10/2020   BUN 19 06/10/2020   CO2 31 06/10/2020   TSH 2.81 06/10/2020   PSA 1.49 06/10/2020   INR 1.47 03/30/2015   HGBA1C 6.0 06/10/2020   MICROALBUR 8.8 (H) 06/10/2020    Echo 04/24/15: Study Conclusions  - Left ventricle: The cavity size was normal. Wall thickness was   increased in a pattern of mild LVH. Systolic function was normal.   The estimated ejection fraction was in the range of 55% to 60%.   Wall motion was normal; there were no regional wall motion   abnormalities. Doppler parameters are consistent with abnormal   left ventricular relaxation (grade 1 diastolic dysfunction). - Aortic valve: A bioprosthesis was present. There was trivial   regurgitation. - Left atrium: The atrium was mildly dilated. - Atrial septum: There was an atrial septal aneurysm. - Pericardium, extracardiac: A small pericardial effusion was   identified.  Impressions:  - Normal LV systolic function; grade 1 diastolic dysfunction; mild   LVH; s/p AVR with trace AI and mean gradient of 18 mmHg; mild   LAE; trace MR.  Myoview 10/22/16: Study Highlights     The left ventricular ejection fraction is mildly decreased (45-54%).  Nuclear stress EF: 53%.  No T wave inversion was noted during stress.  There was no ST segment deviation noted during stress.  This is a low risk study.   No reversible ischemia. LVEF 53% with normal wall motion. This is a low risk study.    Cardiac cath: 10/25/18:  LEFT HEART CATH AND CORS/GRAFTS ANGIOGRAPHY  Conclusion    Ost LAD to Prox LAD lesion is 80% stenosed. Mid LAD lesion is 100% stenosed -after second septal branch which coursed is a tandem LAD  LIMA-dLAD graft was visualized by  angiography and is normal in caliber. The graft exhibits no disease.  Ost Ramus-1 lesion is 90% stenosed. Ost Ramus-2 lesion is 60% stenosed (shortly after lateral branch).  SVG-Ramus graft was visualized by angiography and is large. The graft exhibits minimal luminal irregularities.  Ost Cx lesion is 80% stenosed. Prox Cx to Mid Cx lesion is 80% stenosed. Mid Cx lesion is 60% stenosed.  SVG-dCx graft was visualized by angiography and is large. The graft exhibits minimal luminal irregularities. Retrograde filling of OM 2 and AV groove circumflex  Prox RCA to Mid RCA lesion is 100% stenosed. This is progression from 99% stenosis pre-CABG.  SVG-dRCA graft was visualized by angiography and is large. The graft exhibits no disease.  Unable to cross aortic valve prosthesis.   SUMMARY  Severe native CAD essentially unchanged from post CABG with exception of now 100% occluded RCA.  4 out of 4 patent grafts: LIMA-LAD, SVG-RI (D1), SVG-dCx, SVG-D RCA  Systemic Hypertension  Angiographically small aortic root/stent prosthetic valve.  Unable to cross.   RECOMMENDATIONS  Discharge home after bedrest and hydration  Consider 2D echocardiogram to assess for restenosis of aortic valve  Pending echocardiographic evaluation, would consider non-anginal etiology -> with hypertension, could be related to diastolic dysfunction.   Glenetta Hew, MD   Echo 11/20/18:IMPRESSIONS    1. A 3mm an Edwards porcine bioprosthesis valve is present in the aortic position. Procedure Date: 03/30/15 Echo findings shows no evidence of regurgitation, perivalvular leak, rocking or dehiscence of the aortic prosthesis. Bioprosthetic leaflets not  well seen. Mean systolic gradient is 19 mmHg, no significant difference from gradient reported 1 month post operative in 04/2015.  2. The left ventricle has normal systolic function with an ejection fraction of 60-65%. The cavity size was normal. There is mildly  increased left ventricular wall thickness. Left ventricular diastolic Doppler parameters are consistent with impaired  relaxation due to nondiagnostic images. Elevated left ventricular end-diastolic pressure The E/e' is 20.  3. The right ventricle has normal systolic function. The cavity was normal. There is no increase in right ventricular wall thickness. Right ventricular systolic pressure is mildly elevated with an estimated pressure of 35.9 mmHg.  4. Left atrial size was mild-moderately dilated.  5. There is mild mitral annular calcification present.  6. There is mild dilatation of the ascending aorta measuring 40 mm.    ASSESSMENT AND PLAN:  1.  CAD s/p CABG in October 2016 following NSTEMI.   Myoview negative in May 2018. Cardiac cath in May 2020 showed all grafts were patent. No significant angina. Continue statin.Not on beta blocker due to bradycardia.   2. S/p bioprosthetic AVR for aortic stenosis. On routine SBE prophylaxis.  Echo June 2020 unchanged from initial post op Echo.  3.  Hyperlipidemia. Satisfactory on Crestor 5 mg daily. Well controlled.   4. HTN. With white coat syndrome. BP at home acceptable. Continue same meds.   5. 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   Follow up in 6 months      [name] MD,FACC 08/03/2020 1:07 PM

## 2020-08-06 ENCOUNTER — Telehealth: Payer: Self-pay | Admitting: *Deleted

## 2020-08-06 ENCOUNTER — Other Ambulatory Visit: Payer: Self-pay

## 2020-08-06 ENCOUNTER — Encounter: Payer: Self-pay | Admitting: Cardiology

## 2020-08-06 ENCOUNTER — Ambulatory Visit: Payer: Medicare Other | Admitting: Cardiology

## 2020-08-06 VITALS — BP 160/95 | HR 67 | Ht 68.0 in | Wt 219.8 lb

## 2020-08-06 DIAGNOSIS — I350 Nonrheumatic aortic (valve) stenosis: Secondary | ICD-10-CM

## 2020-08-06 DIAGNOSIS — E785 Hyperlipidemia, unspecified: Secondary | ICD-10-CM | POA: Diagnosis not present

## 2020-08-06 DIAGNOSIS — I25810 Atherosclerosis of coronary artery bypass graft(s) without angina pectoris: Secondary | ICD-10-CM

## 2020-08-06 DIAGNOSIS — Z952 Presence of prosthetic heart valve: Secondary | ICD-10-CM

## 2020-08-06 DIAGNOSIS — I10 Essential (primary) hypertension: Secondary | ICD-10-CM

## 2020-08-06 NOTE — Telephone Encounter (Signed)
Ortho bundle 1 year call completed. ?

## 2020-08-11 ENCOUNTER — Encounter: Payer: Self-pay | Admitting: Neurology

## 2020-08-11 NOTE — Progress Notes (Addendum)
Per BV submitted Botox is a covered benefit but the SP is [employer] SP.   4/13- called to set up account and gave verbal for the Botox script

## 2020-08-18 ENCOUNTER — Other Ambulatory Visit: Payer: Self-pay

## 2020-08-18 ENCOUNTER — Ambulatory Visit (INDEPENDENT_AMBULATORY_CARE_PROVIDER_SITE_OTHER): Payer: Medicare Other

## 2020-08-18 VITALS — BP 180/80 | HR 60 | Temp 98.3°F | Ht 68.0 in | Wt 213.4 lb

## 2020-08-18 DIAGNOSIS — Z0000 Encounter for general adult medical examination without abnormal findings: Secondary | ICD-10-CM | POA: Diagnosis not present

## 2020-08-18 NOTE — Progress Notes (Signed)
Subjective:   Troy Banks is a 80 y.o. male who presents for Medicare Annual/Subsequent preventive examination.  Review of Systems    No ROS. Medicare Wellness Visit. Additional risk factors are reflected in social history. Cardiac Risk Factors include: advanced age (>40men, >74 women);diabetes mellitus;dyslipidemia;hypertension;family history of premature cardiovascular disease;male gender;obesity (BMI >30kg/m2)     Objective:    Today's Vitals   08/18/20 0837  BP: (!) 180/80  Pulse: 60  Temp: 98.3 F (36.8 C)  SpO2: 97%  Weight: 213 lb 6.4 oz (96.8 kg)  Height: 5\' 8"  (1.727 m)  PainSc: 0-No pain   Body mass index is 32.45 kg/m.  Advanced Directives 08/18/2020 07/16/2020 12/17/2019 08/14/2019 08/13/2019 04/01/2019 01/24/2019  Does Patient Have a Medical Advance Directive? Yes Yes Yes - No Yes Yes  Type of Advance Directive Living will;Healthcare Power of [place];Living will - - [place];Living will Living will;Healthcare Power of Attorney  Does patient want to make changes to medical advance directive? No - Patient declined - - - - - -  Copy of Press photographer in Chart? - - - - - No - copy requested -  Would patient like information on creating a medical advance directive? - - - No - Patient declined No - Patient declined - -    Current Medications (verified) Outpatient Encounter Medications as of 08/18/2020  Medication Sig  . allopurinol (ZYLOPRIM) 300 MG tablet TAKE 1 TABLET BY MOUTH EVERY DAY  . amLODipine (NORVASC) 10 MG tablet TAKE 1 TABLET BY MOUTH EVERY DAY  . Ascorbic Acid (VITAMIN C) 1000 MG tablet Take 1,000 mg by mouth 2 (two) times daily.   [name] aspirin EC 81 MG tablet Take 81 mg by mouth daily. Swallow whole.  . calcium carbonate (TUMS EX) 750 MG chewable tablet Chew 1 tablet by mouth daily as needed for heartburn.   . Carboxymethylcellulose Sodium (LUBRICANT EYE DROPS  OP) Place 1 drop into both eyes 2 (two) times daily as needed (dry/irritated eyes.).   [name] Cholecalciferol (VITAMIN D-3) 125 MCG (5000 UT) TABS Take 5,000 Units by mouth 2 (two) times a day.  . famotidine (PEPCID) 40 MG tablet TAKE 1 TABLET BY MOUTH EVERY DAY. MUST SEE PROVIDER FOR FUTURE REFILLS  . furosemide (LASIX) 40 MG tablet TAKE 1 TABLET BY MOUTH EVERY OTHER DAY  . levothyroxine (SYNTHROID) 25 MCG tablet TAKE 1 TABLET BY MOUTH EVERY DAY WITH BREAKFAST  . nitroGLYCERIN (NITROSTAT) 0.4 MG SL tablet PLACE 1 TABLET UNDER THE TONGUE EVERY 5 MINUTES AS NEEDED FOR CHEST PAIN. (Patient taking differently: Place 0.4 mg under the tongue every 5 (five) minutes as needed for chest pain.)  . primidone (MYSOLINE) 50 MG tablet Take as directed on AVS  . rosuvastatin (CRESTOR) 10 MG tablet TAKE 1/2 TABLET BY MOUTH EVERY DAY  . Turmeric 500 MG CAPS Take 500 mg by mouth daily.   . vitamin B-12 (CYANOCOBALAMIN) 1000 MCG tablet Take 1,000 mcg by mouth daily.   . [DISCONTINUED] ranitidine (ZANTAC) 150 MG tablet TAKE 1 TABLET BY MOUTH 2 TIMES DAILY (Patient taking differently: Take 150 mg by mouth 2 (two) times a day. )   Facility-Administered Encounter Medications as of 08/18/2020  Medication  . sodium chloride flush (NS) 0.9 % injection 3 mL    Allergies (verified) Acyclovir and related, Benzodiazepines, Compazine [prochlorperazine maleate], Coreg [carvedilol], Flomax [tamsulosin hcl], Lisinopril, Mobic [meloxicam], Ondansetron, Oxycodone, Promethazine, Sertraline hcl, Tramadol, Atorvastatin, and  Colchicine   History: Past Medical History:  Diagnosis Date  . Allergy   . Anxiety    PTSD  . Aortic stenosis, moderate 03/27/2015   post AVR echo Echo 11/16: Mild LVH, EF 55-60%, normal wall motion, grade 1 diastolic dysfunction, bioprosthetic AVR okay (mean gradient 18 mmHg) with trivial AI, mild LAE, atrial septal aneurysm, small effusion   . Arthritis    "qwhere" (10/29/2015)  . Cancer ([place])    skin cancer   . Carotid artery stenosis    Carotid US 11/17: 1-39% bilateral ICA stenosis. F/u prn  . Cervical stenosis of spinal canal    fusion 2006  . CHF (congestive heart failure) ([place])    "secondry to OHS"  . Closed left pilon fracture   . COLONIC POLYPS, HX OF   . Complication of anesthesia    "takes a lot to put him under and has woken up during surgery" Difficult to wake up . PTSD; "does not metabolize RX well"that he gets "violent", per pt.   . Coronary artery disease   . DIVERTICULOSIS, COLON   . Dysrhythmia    2 bouts of afib post op and at home but corrected with medication.  . Familial tremor 03/19/2014  . Family history of adverse reaction to anesthesia    Father - hold to get asleep  . GERD (gastroesophageal reflux disease)    uses Zantac  . GOUT   . Headache   . Heart murmur    had aortic value replacement  . High cholesterol   . History of blood transfusion   . History of kidney stones    surgery  . HYPERTENSION    off ACEI 2010 because of hyperkalemia in setting of ARI  . Hypothyroidism    newly diaganosed 09/2015  . Myocardial infarction [facility])    October 20th 2016  . OSA on CPAP   . Pneumonia   . 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 PTSD (post-traumatic stress disorder)   . PTSD (post-traumatic stress disorder)   . Sleep apnea    On a CPAP   Past Surgical History:  Procedure Laterality Date  . ANKLE FUSION Left 03/17/2017   Procedure: FUSION  PILON FRACTURE WITH BONE GRAFTING;  Surgeon: Shona Needles, MD;  Location: [place];  Service: Orthopedics;  Laterality: Left;  . ANTERIOR FUSION CERVICAL SPINE  2002  . AORTIC VALVE REPLACEMENT N/A 03/30/2015   Procedure: AORTIC VALVE REPLACEMENT (AVR);  Surgeon: Grace Isaac, MD;  Location: [facility];  Service: Open Heart Surgery;  Laterality: N/A;  . CARDIAC CATHETERIZATION N/A 03/26/2015   Procedure: Left Heart Cath and Coronary Angiography;   Surgeon: Leonie Man, MD;  Location: [place];  Service: Cardiovascular;  Laterality: N/A;  . CARDIAC VALVE REPLACEMENT    . COLONOSCOPY W/ POLYPECTOMY    . CORONARY ARTERY BYPASS GRAFT N/A 03/30/2015   Procedure: CORONARY ARTERY BYPASS GRAFTING (CABG) x four, using left internal mammary artery and left    leg greater saphenous vein harvested endoscopically ;  Surgeon: Grace Isaac, MD;  Location: [facility];  Service: Open Heart Surgery;  Laterality: N/A;  . CYSTOSCOPY/URETEROSCOPY/HOLMIUM LASER/STENT PLACEMENT Bilateral 11/07/2016   Procedure: CYSTOSCOPY/URETEROSCOPY/HOLMIUM LASER/STENT PLACEMENT;  Surgeon: Nickie Retort, MD;  Location[facility];  Service: Urology;  Laterality: Bilateral;  . CYSTOSCOPY/URETEROSCOPY/HOLMIUM LASER/STENT PLACEMENT Bilateral 11/23/2016   Procedure: CYSTOSCOPY/BILATERAL URETEROSCOPY/HOLMIUM LASER/STENT EXCHANGE;  Surgeon: Nickie Retort, MD;  [facility];  Service: Urology;  Laterality: Bilateral;  NEEDS 90 MIN   . EXTERNAL FIXATION LEG Left 02/22/2017   Procedure: EXTERNAL FIXATION LEFT ANKLE;  Surgeon: Renette Butters, MD;  Location: [facility];  Service: Orthopedics;  Laterality: Left;  . HEMORRHOID BANDING  1970s  . INGUINAL HERNIA REPAIR Left 1962  . [place]   "cut me open"  . LEFT HEART CATH AND CORS/GRAFTS ANGIOGRAPHY N/A 10/25/2018   Procedure: LEFT HEART CATH AND CORS/GRAFTS ANGIOGRAPHY;  Surgeon: Leonie Man, MD;  Location: [facility];  Service: Cardiovascular;  Laterality: N/A;  . POSTERIOR FUSION CERVICAL SPINE  2006  . TEE WITHOUT CARDIOVERSION N/A 03/30/2015   Procedure: TRANSESOPHAGEAL ECHOCARDIOGRAM (TEE);  Surgeon: Grace Isaac, MD;  Location: [facility];  Service: Open Heart Surgery;  Laterality: N/A;  . TESTICLE TORSION REDUCTION Right 1960  . TOE FUSION Left 1985 & 2004   "great toe" and removal  . TONSILLECTOMY    . TOTAL HIP ARTHROPLASTY Right 10/27/2015   Procedure: RIGHT TOTAL HIP  ARTHROPLASTY ANTERIOR APPROACH and steroid injection right foot;  Surgeon: Mcarthur Rossetti, MD;  Location: [facility];  Service: Orthopedics;  Laterality: Right;  . TOTAL HIP ARTHROPLASTY Left 08/13/2019  . TOTAL HIP ARTHROPLASTY Left 08/13/2019   Procedure: LEFT TOTAL HIP ARTHROPLASTY ANTERIOR APPROACH;  Surgeon: Mcarthur Rossetti, MD;  Location: [facility];  Service: Orthopedics;  Laterality: Left;   Family History  Problem Relation Age of Onset  . Heart disease Mother   . Hypertension Mother   . Diabetes Mother   . Heart disease Father   . Hypertension Father   . Tremor Sister   . Healthy Brother   . Healthy Son   . Prostate cancer Maternal Uncle   . Hypertension Maternal Grandmother   . Heart disease Maternal Grandmother   . Hypertension Maternal Grandfather   . Heart disease Maternal Grandfather   . Hypertension Paternal Grandfather   . Heart disease Paternal Grandfather   . Diabetes Paternal Grandfather   . Alcohol abuse Other   . Arthritis Other   . Drug abuse Sister   . Healthy Daughter   . Colon polyps Neg Hx   . Esophageal cancer Neg Hx   . Rectal cancer Neg Hx   . Stomach cancer Neg Hx   . Colon cancer Neg Hx    Social History   Socioeconomic History  . Marital status: Married    Spouse name: Karmen Bongo, RN  . Number of children: 2  . Years of education: Not on file  . Highest education level: Some college, no degree  Occupational History  . Occupation: Retired Animal nutritionist  Tobacco Use  . Smoking status: Former Smoker    Packs/day: 1.00    Years: 34.00    Pack years: 34.00    Types: Cigarettes    Quit date: 06/06/1990    Years since quitting: 30.2  . Smokeless tobacco: Never Used  Vaping Use  . Vaping Use: Never used  Substance and Sexual Activity  . Alcohol use: Yes    Alcohol/week: 3.0 standard drinks    Types: 1 Cans of beer, 2 Shots of liquor per week    Comment: occassional  . Drug use: No  . Sexual activity: Yes  Other Topics  Concern  . Not on file  Social History Narrative  . Not on file   Social Determinants of Health   Financial Resource Strain: Low Risk   .  Difficulty of Paying Living Expenses: Not hard at all  Food Insecurity: No Food Insecurity  . Worried About Charity fundraiser in the Last Year: Never true  . Ran Out of Food in the Last Year: Never true  Transportation Needs: No Transportation Needs  . Lack of Transportation (Medical): No  . Lack of Transportation (Non-Medical): No  Physical Activity: Sufficiently Active  . Days of Exercise per Week: 5 days  . Minutes of Exercise per Session: 30 min  Stress: No Stress Concern Present  . Feeling of Stress : Not at all  Social Connections: Socially Integrated  . Frequency of Communication with Friends and Family: More than three times a week  . Frequency of Social Gatherings with Friends and Family: Once a week  . Attends Religious Services: More than 4 times per year  . Active Member of Clubs or Organizations: Yes  . Attends Archivist Meetings: More than 4 times per year  . Marital Status: Married    Tobacco Counseling Counseling given: Not Answered   Clinical Intake:  Pre-visit preparation completed: Yes  Pain : No/denies pain Pain Score: 0-No pain     BMI - recorded: 32.45 Nutritional Status: BMI > 30  Obese Nutritional Risks: None Diabetes: Yes (Diet controlled diabetes) CBG done?: No Did pt. bring in CBG monitor from home?: No  How often do you need to have someone help you when you read instructions, pamphlets, or other written materials from your doctor or pharmacy?: 1 - Never What is the last grade level you completed in school?: Associates Degree  Diabetic? Yes, diet controlled  Interpreter Needed?: No  Information entered by :: Lisette Abu, LPN   Activities of Daily Living In your present state of health, do you have any difficulty performing the following activities: 08/18/2020  Hearing? Y   Vision? N  Difficulty concentrating or making decisions? N  Walking or climbing stairs? N  Dressing or bathing? N  Doing errands, shopping? N  Preparing Food and eating ? N  Using the Toilet? N  In the past six months, have you accidently leaked urine? N  Do you have problems with loss of bowel control? N  Managing your Medications? N  Managing your Finances? N  Housekeeping or managing your Housekeeping? N  Some recent data might be hidden    Patient Care Team: [name], MD as PCP - General (Internal Medicine) [name], [name], MD as PCP - Cardiology (Cardiology) [name], [name], MD as Consulting Physician (Cardiology) Mcarthur Rossetti, MD as Consulting Physician (Orthopedic Surgery) Gardiner Barefoot, DPM as Consulting Physician (Podiatry) [name], [name], DO as Consulting Physician (Neurology)  Indicate any recent Medical Services you may have received from other than [facility] providers in the past year (date may be approximate).     Assessment:   This is a routine wellness examination for Troy Banks.  Hearing/Vision screen No exam data present  Dietary issues and exercise activities discussed: Current Exercise Habits: Home exercise routine, Type of exercise: walking, Time (Minutes): 30, Frequency (Times/Week): 5, Weekly Exercise (Minutes/Week): 150, Intensity: Mild, Exercise limited by: cardiac condition(s)  Goals    . Exercise 150 minutes per week (moderate activity)     Will start silver sneaker program Will start water aerobics at lest x2 per week;     . Patient Stated     I want to lose weight by increasing my physical activity. I plan to start to go back to the gym and exercise  more routinely.      Depression Screen PHQ 2/9 Scores 08/18/2020 06/10/2020 11/26/2019 04/01/2019 11/02/2018 10/27/2017 10/26/2016  PHQ - 2 Score 0 0 0 0 1 1 0  PHQ- 9 Score - - - - - - 3    Fall Risk Fall Risk  08/18/2020 07/16/2020 06/10/2020 12/17/2019 11/26/2019  Falls in the past year? 0  0 0 0 0  Number falls in past yr: 0 0 0 0 -  Injury with Fall? 0 0 0 0 -  Risk Factor Category  - - - - -  Risk for fall due to : No Fall Risks - - - -  Follow up Falls evaluation completed - - - -    FALL RISK PREVENTION PERTAINING TO THE HOME:  Any stairs in or around the home? No  If so, are there any without handrails? No  Home free of loose throw rugs in walkways, pet beds, electrical cords, etc? Yes  Adequate lighting in your home to reduce risk of falls? Yes   ASSISTIVE DEVICES UTILIZED TO PREVENT FALLS:  Life alert? No  Use of a cane, walker or w/c? No  Grab bars in the bathroom? No  Shower chair or bench in shower? Yes  Elevated toilet seat or a handicapped toilet? Yes   TIMED UP AND GO:  Was the test performed? No .  Length of time to ambulate 10 feet: 0 sec.   Gait steady and fast without use of assistive device  Cognitive Function: Normal cognitive status assessed by direct observation by this Nurse Health Advisor. No abnormalities found.       6CIT Screen 04/01/2019  What Year? 0 points  What month? 0 points  What time? 0 points  Count back from 20 0 points  Months in reverse 0 points  Repeat phrase 0 points  Total Score 0    Immunizations Immunization History  Administered Date(s) Administered  . DT (Pediatric) 06/06/2008  . PFIZER(Purple Top)SARS-COV-2 Vaccination 02/17/2020, 03/09/2020, 08/10/2020  . Pneumococcal Conjugate-13 09/08/2014  . Pneumococcal Polysaccharide-23 10/27/2017  . Td 06/06/2008  . Tdap 11/02/2018    TDAP status: Up to date  Flu Vaccine status: Declined, Education has been provided regarding the importance of this vaccine but patient still declined. Advised may receive this vaccine at local pharmacy or Health Dept. Aware to provide a copy of the vaccination record if obtained from local pharmacy or Health Dept. Verbalized acceptance and understanding.  Pneumococcal vaccine status: Up to date  Covid-19 vaccine status:  Completed vaccines  Qualifies for Shingles Vaccine? Yes   Zostavax completed No   Shingrix Completed?: No.    Education has been provided regarding the importance of this vaccine. Patient has been advised to call insurance company to determine out of pocket expense if they have not yet received this vaccine. Advised may also receive vaccine at local pharmacy or Health Dept. Verbalized acceptance and understanding.  Screening Tests Health Maintenance  Topic Date Due  . INFLUENZA VACCINE  09/03/2020 (Originally 01/05/2020)  . FOOT EXAM  11/25/2020  . HEMOGLOBIN A1C  12/08/2020  . OPHTHALMOLOGY EXAM  12/08/2020  . COLONOSCOPY (Pts 45-45yrs Insurance coverage will need to be confirmed)  03/13/2021  . URINE MICROALBUMIN  06/10/2021  . TETANUS/TDAP  11/01/2028  . COVID-19 Vaccine  Completed  . Hepatitis C Screening  Completed  . PNA vac Low Risk Adult  Completed  . HPV VACCINES  Aged Out    Health Maintenance  There are no preventive care reminders  to display for this patient.  Colorectal cancer screening: Type of screening: Colonoscopy. Completed 03/13/2018. Repeat every 3 years  Lung Cancer Screening: (Low Dose CT Chest recommended if Age 72-80 years, 30 pack-year currently smoking OR have quit w/in 15years.) does qualify.   Lung Cancer Screening Referral: no  Additional Screening:  Hepatitis C Screening: does qualify; Completed yes  Vision Screening: Recommended annual ophthalmology exams for early detection of glaucoma and other disorders of the eye. Is the patient up to date with their annual eye exam?  Yes  Who is the provider or what is the name of the office in which the patient attends annual eye exams? Gala Romney MD. If pt is not established with a provider, would they like to be referred to a provider to establish care? No .   Dental Screening: Recommended annual dental exams for proper oral hygiene  Community Resource Referral / Chronic Care Management: CRR required  this visit?  No   CCM required this visit?  No      Plan:     I have personally reviewed and noted the following in the patient's chart:   . Medical and social history . Use of alcohol, tobacco or illicit drugs  . Current medications and supplements . Functional ability and status . Nutritional status . Physical activity . Advanced directives . List of other physicians . Hospitalizations, surgeries, and ER visits in previous 12 months . Vitals . Screenings to include cognitive, depression, and falls . Referrals and appointments  In addition, I have reviewed and discussed with patient certain preventive protocols, quality metrics, and best practice recommendations. A written personalized care plan for preventive services as well as general preventive health recommendations were provided to patient.     Sheral Flow, LPN   01/22/2992   Nurse Notes:  Medications reviewed with patient; no opioid use noted.

## 2020-08-18 NOTE — Patient Instructions (Signed)
Troy Banks , Thank you for taking time to come for your Medicare Wellness Visit. I appreciate your ongoing commitment to your health goals. Please review the following plan we discussed and let me know if I can assist you in the future.   Screening recommendations/referrals: Colonoscopy: 03/13/2018; due every 3 years Recommended yearly ophthalmology/optometry visit for glaucoma screening and checkup Recommended yearly dental visit for hygiene and checkup  Vaccinations: Influenza vaccine: declined Pneumococcal vaccine: 09/08/2014, 10/27/2017 Tdap vaccine: 11/02/2018 Shingles vaccine: declined   Covid-19: 02/17/2020, 03/09/2020, 08/10/2020  Advanced directives: Please bring a copy of your health care power of attorney and living will to the office at your convenience.  Conditions/risks identified: Yes; Reviewed health maintenance screenings with patient today and relevant education, vaccines, and/or referrals were provided. Please continue to do your personal lifestyle choices by: daily care of teeth and gums, regular physical activity (goal should be 5 days a week for 30 minutes), eat a healthy diet, avoid tobacco and drug use, limiting any alcohol intake, taking a low-dose aspirin (if not allergic or have been advised by your provider otherwise) and taking vitamins and minerals as recommended by your provider. Continue doing brain stimulating activities (puzzles, reading, adult coloring books, staying active) to keep memory sharp. Continue to eat heart healthy diet (full of fruits, vegetables, whole grains, lean protein, water--limit salt, fat, and sugar intake) and increase physical activity as tolerated.  Next appointment: Please schedule your next Medicare Wellness Visit with your Nurse Health Advisor in 1 year by calling [phone].  Preventive Care 78 Years and Older, Male Preventive care refers to lifestyle choices and visits with your health care provider that can promote health and  wellness. What does preventive care include?  A yearly physical exam. This is also called an annual well check.  Dental exams once or twice a year.  Routine eye exams. Ask your health care provider how often you should have your eyes checked.  Personal lifestyle choices, including:  Daily care of your teeth and gums.  Regular physical activity.  Eating a healthy diet.  Avoiding tobacco and drug use.  Limiting alcohol use.  Practicing safe sex.  Taking low doses of aspirin every day.  Taking vitamin and mineral supplements as recommended by your health care provider. What happens during an annual well check? The services and screenings done by your health care provider during your annual well check will depend on your age, overall health, lifestyle risk factors, and family history of disease. Counseling  Your health care provider may ask you questions about your:  Alcohol use.  Tobacco use.  Drug use.  Emotional well-being.  Home and relationship well-being.  Sexual activity.  Eating habits.  History of falls.  Memory and ability to understand (cognition).  Work and work Statistician. Screening  You may have the following tests or measurements:  Height, weight, and BMI.  Blood pressure.  Lipid and cholesterol levels. These may be checked every 5 years, or more frequently if you are over 45 years old.  Skin check.  Lung cancer screening. You may have this screening every year starting at age 78 if you have a 30-pack-year history of smoking and currently smoke or have quit within the past 15 years.  Fecal occult blood test (FOBT) of the stool. You may have this test every year starting at age 78.  Flexible sigmoidoscopy or colonoscopy. You may have a sigmoidoscopy every 5 years or a colonoscopy every 10 years starting at age 78.  Prostate  cancer screening. Recommendations will vary depending on your family history and other risks.  Hepatitis C blood  test.  Hepatitis B blood test.  Sexually transmitted disease (STD) testing.  Diabetes screening. This is done by checking your blood sugar (glucose) after you have not eaten for a while (fasting). You may have this done every 1-3 years.  Abdominal aortic aneurysm (AAA) screening. You may need this if you are a current or former smoker.  Osteoporosis. You may be screened starting at age 78 if you are at high risk. Talk with your health care provider about your test results, treatment options, and if necessary, the need for more tests. Vaccines  Your health care provider may recommend certain vaccines, such as:  Influenza vaccine. This is recommended every year.  Tetanus, diphtheria, and acellular pertussis (Tdap, Td) vaccine. You may need a Td booster every 10 years.  Zoster vaccine. You may need this after age 78.  Pneumococcal 13-valent conjugate (PCV13) vaccine. One dose is recommended after age 78.  Pneumococcal polysaccharide (PPSV23) vaccine. One dose is recommended after age 78. Talk to your health care provider about which screenings and vaccines you need and how often you need them. This information is not intended to replace advice given to you by your health care provider. Make sure you discuss any questions you have with your health care provider. Document Released: 06/19/2015 Document Revised: 02/10/2016 Document Reviewed: 03/24/2015 Elsevier Interactive Patient Education  2017 [place] Prevention in the Home Falls can cause injuries. They can happen to people of all ages. There are many things you can do to make your home safe and to help prevent falls. What can I do on the outside of my home?  Regularly fix the edges of walkways and driveways and fix any cracks.  Remove anything that might make you trip as you walk through a door, such as a raised step or threshold.  Trim any bushes or trees on the path to your home.  Use bright outdoor  lighting.  Clear any walking paths of anything that might make someone trip, such as rocks or tools.  Regularly check to see if handrails are loose or broken. Make sure that both sides of any steps have handrails.  Any raised decks and porches should have guardrails on the edges.  Have any leaves, snow, or ice cleared regularly.  Use sand or salt on walking paths during winter.  Clean up any spills in your garage right away. This includes oil or grease spills. What can I do in the bathroom?  Use night lights.  Install grab bars by the toilet and in the tub and shower. Do not use towel bars as grab bars.  Use non-skid mats or decals in the tub or shower.  If you need to sit down in the shower, use a plastic, non-slip stool.  Keep the floor dry. Clean up any water that spills on the floor as soon as it happens.  Remove soap buildup in the tub or shower regularly.  Attach bath mats securely with double-sided non-slip rug tape.  Do not have throw rugs and other things on the floor that can make you trip. What can I do in the bedroom?  Use night lights.  Make sure that you have a light by your bed that is easy to reach.  Do not use any sheets or blankets that are too big for your bed. They should not hang down onto the floor.  Have a  firm chair that has side arms. You can use this for support while you get dressed.  Do not have throw rugs and other things on the floor that can make you trip. What can I do in the kitchen?  Clean up any spills right away.  Avoid walking on wet floors.  Keep items that you use a lot in easy-to-reach places.  If you need to reach something above you, use a strong step stool that has a grab bar.  Keep electrical cords out of the way.  Do not use floor polish or wax that makes floors slippery. If you must use wax, use non-skid floor wax.  Do not have throw rugs and other things on the floor that can make you trip. What can I do with my  stairs?  Do not leave any items on the stairs.  Make sure that there are handrails on both sides of the stairs and use them. Fix handrails that are broken or loose. Make sure that handrails are as long as the stairways.  Check any carpeting to make sure that it is firmly attached to the stairs. Fix any carpet that is loose or worn.  Avoid having throw rugs at the top or bottom of the stairs. If you do have throw rugs, attach them to the floor with carpet tape.  Make sure that you have a light switch at the top of the stairs and the bottom of the stairs. If you do not have them, ask someone to add them for you. What else can I do to help prevent falls?  Wear shoes that:  Do not have high heels.  Have rubber bottoms.  Are comfortable and fit you well.  Are closed at the toe. Do not wear sandals.  If you use a stepladder:  Make sure that it is fully opened. Do not climb a closed stepladder.  Make sure that both sides of the stepladder are locked into place.  Ask someone to hold it for you, if possible.  Clearly mark and make sure that you can see:  Any grab bars or handrails.  First and last steps.  Where the edge of each step is.  Use tools that help you move around (mobility aids) if they are needed. These include:  Canes.  Walkers.  Scooters.  Crutches.  Turn on the lights when you go into a dark area. Replace any light bulbs as soon as they burn out.  Set up your furniture so you have a clear path. Avoid moving your furniture around.  If any of your floors are uneven, fix them.  If there are any pets around you, be aware of where they are.  Review your medicines with your doctor. Some medicines can make you feel dizzy. This can increase your chance of falling. Ask your doctor what other things that you can do to help prevent falls. This information is not intended to replace advice given to you by your health care provider. Make sure you discuss any  questions you have with your health care provider. Document Released: 03/19/2009 Document Revised: 10/29/2015 Document Reviewed: 06/27/2014 Elsevier Interactive Patient Education  2017 [employer].

## 2020-08-24 ENCOUNTER — Ambulatory Visit: Payer: Self-pay

## 2020-08-24 ENCOUNTER — Encounter: Payer: Self-pay | Admitting: Orthopaedic Surgery

## 2020-08-24 ENCOUNTER — Ambulatory Visit: Payer: Medicare Other | Admitting: Orthopaedic Surgery

## 2020-08-24 DIAGNOSIS — Z96642 Presence of left artificial hip joint: Secondary | ICD-10-CM | POA: Diagnosis not present

## 2020-08-24 DIAGNOSIS — M25561 Pain in right knee: Secondary | ICD-10-CM | POA: Diagnosis not present

## 2020-08-24 NOTE — Progress Notes (Signed)
Office Visit Note   Patient: Troy Banks           Date of Birth: 24-Jan-1941           MRN: [id number] Visit Date: 08/24/2020              Requested by: [name], MD [address] [place],  [place] [address] PCP: [name], MD   Assessment & Plan: Visit Diagnoses:  1. Status post total replacement of left hip   2. Right knee pain, unspecified chronicity     Plan: As far as his knees goes, I would recommend quad strengthening exercises combined with anti-inflammatories and supplements.  If the knee starts bothering him enough he can come back and see [place] because my neck step would be a steroid injection.  I would still stressed the importance of quad strengthening as well.  From a hip standpoint follow-up can be as needed.  All questions and concerns were answered and addressed.  Follow-Up Instructions: Return if symptoms worsen or fail to improve.   Orders:  Orders Placed This Encounter  Procedures  . XR Pelvis 1-2 Views  . XR Knee 1-2 Views Right   No orders of the defined types were placed in this encounter.     Procedures: No procedures performed   Clinical Data: No additional findings.   Subjective: Chief Complaint  Patient presents with  . Left Hip - Follow-up  . Right Knee - Pain  The patient is now 1 year status post a left total hip arthroplasty and between 4 and 5 years status post a right total hip arthroplasty.  He says the hips have been doing well.  Is been having some right knee pain.  He is an active 80 year old gentleman.  He is never had right knee surgery.  He says the biggest problems are just going up and down stairs and hills with some global pain around the knee.  He denies any locking and catching.  He has had no other acute change in his medical status.  HPI  Review of Systems He currently denies any headache, chest pain, shortness of breath, fever, chills, nausea, vomiting  Objective: Vital Signs: There were no vitals  taken for this visit.  Physical Exam He is alert and oriented x3 and in no acute distress Ortho Exam Examination of his right knee shows no effusion.  His McMurray's and Lachman's exams are negative.  He has good range of motion the knee but some global tenderness.  There is some slight patellofemoral crepitation.  Examination of both hips shows smooth and fluid range of motion. Specialty Comments:  No specialty comments available.  Imaging: XR Knee 1-2 Views Right  Result Date: 08/24/2020 2 views of the right knee shows no acute findings.  There is mild to moderate 3 changes with slight medial joint space narrowing as well as patellofemoral narrowing and small osteophytes around the knee.  XR Pelvis 1-2 Views  Result Date: 08/24/2020 An AP pelvis shows well-seated bilateral hip arthroplasties with no complicating features.    PMFS History: Patient Active Problem List   Diagnosis Date Noted  . RUQ pain 06/10/2020  . History of lacunar cerebrovascular accident 04/05/2020  . Headache 03/16/2020  . Ankle pain 01/22/2020  . Hypothyroidism 11/26/2019  . Epididymitis 08/28/2019  . Unilateral primary osteoarthritis, left hip 08/13/2019  . Status post total replacement of left hip 08/13/2019  . Abdominal wall hernia 11/02/2018  . Progressive angina ([place]) 10/25/2018  .  DOE (dyspnea on exertion) 10/25/2018  . Abscess of right buttock 11/27/2017  . Acute sinus infection 11/05/2017  . Pilon fracture 03/17/2017  . Pilon fracture of left tibia 03/17/2017  . At risk for adverse drug reaction 02/28/2017  . Closed fracture dislocation of ankle joint, left, with delayed healing, subsequent encounter 02/22/2017  . Renal stone 10/26/2016  . Gross hematuria 10/26/2016  . Bladder neck obstruction 10/26/2016  . External hemorrhoid, thrombosed 06/10/2016  . Internal hemorrhoids 06/10/2016  . Lumbar back pain 05/19/2016  . Osteoarthritis of right hip 10/27/2015  . Encounter for well adult exam  with abnormal findings 10/27/2015  . Chronic chest pain 09/22/2015  . Acute gouty arthritis 06/11/2015  . Coronary artery disease involving native coronary artery with angina pectoris ([place]) 06/11/2015  . Chronic diastolic CHF (congestive heart failure) ([place]) 06/11/2015  . H/O tissue AVR Oct 2016 06/10/2015  . PAF post CABG/AVR- Amiodarone 06/10/2015  . Dyslipidemia 06/10/2015  . S/P CABG x 4-Oct 2016 03/30/2015  . H/O NSTEMI-Oct 2016 03/27/2015  . 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 Depression 09/08/2014  . GERD (gastroesophageal reflux disease)   . Essential tremor 03/31/2014  . Iliotibial band syndrome affecting left lower leg 01/01/2014  . Spondylolisthesis at L4-L5 level 01/01/2014  . Sinus bradycardia 12/31/2013  . Greater trochanteric bursitis of right hip 12/04/2013  . OSA on CPAP   . Diet-controlled diabetes mellitus ([place]) 07/08/2009  . COLONIC POLYPS, HX OF 12/31/2008  . GOUT 12/22/2008  . Essential hypertension 12/22/2008  . Diverticulosis of colon 12/22/2008  . NEPHROLITHIASIS, HX OF 12/22/2008   Past Medical History:  Diagnosis Date  . Allergy   . Anxiety    PTSD  . Aortic stenosis, moderate 03/27/2015   post AVR echo Echo 11/16: Mild LVH, EF 55-60%, normal wall motion, grade 1 diastolic dysfunction, bioprosthetic AVR okay (mean gradient 18 mmHg) with trivial AI, mild LAE, atrial septal aneurysm, small effusion   . Arthritis    "qwhere" (10/29/2015)  . Cancer ([place])    skin cancer  . Carotid artery stenosis    Carotid US 11/17: 1-39% bilateral ICA stenosis. F/u prn  . Cervical stenosis of spinal canal    fusion 2006  . CHF (congestive heart failure) ([place])    "secondry to OHS"  . Closed left pilon fracture   . COLONIC POLYPS, HX OF   . Complication of anesthesia    "takes a lot to put him under and has woken up during surgery" Difficult to wake up . PTSD; "does not metabolize RX well"that he gets "violent", per pt.   . Coronary  artery disease   . DIVERTICULOSIS, COLON   . Dysrhythmia    2 bouts of afib post op and at home but corrected with medication.  . Familial tremor 03/19/2014  . Family history of adverse reaction to anesthesia    Father - hold to get asleep  . GERD (gastroesophageal reflux disease)    uses Zantac  . GOUT   . Headache   . Heart murmur    had aortic value replacement  . High cholesterol   . History of blood transfusion   . History of kidney stones    surgery  . HYPERTENSION    off ACEI 2010 because of hyperkalemia in setting of ARI  . Hypothyroidism    newly diaganosed 09/2015  . Myocardial infarction [facility])    October 20th 2016  . OSA on CPAP   . Pneumonia   .  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 PTSD (post-traumatic stress disorder)   . PTSD (post-traumatic stress disorder)   . Sleep apnea    On a CPAP    Family History  Problem Relation Age of Onset  . Heart disease Mother   . Hypertension Mother   . Diabetes Mother   . Heart disease Father   . Hypertension Father   . Tremor Sister   . Healthy Brother   . Healthy Son   . Prostate cancer Maternal Uncle   . Hypertension Maternal Grandmother   . Heart disease Maternal Grandmother   . Hypertension Maternal Grandfather   . Heart disease Maternal Grandfather   . Hypertension Paternal Grandfather   . Heart disease Paternal Grandfather   . Diabetes Paternal Grandfather   . Alcohol abuse Other   . Arthritis Other   . Drug abuse Sister   . Healthy Daughter   . Colon polyps Neg Hx   . Esophageal cancer Neg Hx   . Rectal cancer Neg Hx   . Stomach cancer Neg Hx   . Colon cancer Neg Hx     Past Surgical History:  Procedure Laterality Date  . ANKLE FUSION Left 03/17/2017   Procedure: FUSION  PILON FRACTURE WITH BONE GRAFTING;  Surgeon: Shona Needles, MD;  Location: [facility];  Service: Orthopedics;  Laterality: Left;  . ANTERIOR FUSION CERVICAL SPINE   2002  . AORTIC VALVE REPLACEMENT N/A 03/30/2015   Procedure: AORTIC VALVE REPLACEMENT (AVR);  Surgeon: Grace Isaac, MD;  Location: [facility];  Service: Open Heart Surgery;  Laterality: N/A;  . CARDIAC CATHETERIZATION N/A 03/26/2015   Procedure: Left Heart Cath and Coronary Angiography;  Surgeon: Leonie Man, MD;  Location: [facility];  Service: Cardiovascular;  Laterality: N/A;  . CARDIAC VALVE REPLACEMENT    . COLONOSCOPY W/ POLYPECTOMY    . CORONARY ARTERY BYPASS GRAFT N/A 03/30/2015   Procedure: CORONARY ARTERY BYPASS GRAFTING (CABG) x four, using left internal mammary artery and left    leg greater saphenous vein harvested endoscopically ;  Surgeon: Grace Isaac, MD;  Location: [facility];  Service: Open Heart Surgery;  Laterality: N/A;  . CYSTOSCOPY/URETEROSCOPY/HOLMIUM LASER/STENT PLACEMENT Bilateral 11/07/2016   Procedure: CYSTOSCOPY/URETEROSCOPY/HOLMIUM LASER/STENT PLACEMENT;  Surgeon: Nickie Retort, MD;  Location: [facility];  Service: Urology;  Laterality: Bilateral;  . CYSTOSCOPY/URETEROSCOPY/HOLMIUM LASER/STENT PLACEMENT Bilateral 11/23/2016   Procedure: CYSTOSCOPY/BILATERAL URETEROSCOPY/HOLMIUM LASER/STENT EXCHANGE;  Surgeon: Nickie Retort, MD;  Location: [facility];  Service: Urology;  Laterality: Bilateral;  NEEDS 90 MIN   . EXTERNAL FIXATION LEG Left 02/22/2017   Procedure: EXTERNAL FIXATION LEFT ANKLE;  Surgeon: Renette Butters, MD;  Location: [facility];  Service: Orthopedics;  Laterality: Left;  . HEMORRHOID BANDING  1970s  . INGUINAL HERNIA REPAIR Left 1962  . [place]   "cut me open"  . LEFT HEART CATH AND CORS/GRAFTS ANGIOGRAPHY N/A 10/25/2018   Procedure: LEFT HEART CATH AND CORS/GRAFTS ANGIOGRAPHY;  Surgeon: Leonie Man, MD;  Location: [facility];  Service: Cardiovascular;  Laterality: N/A;  . POSTERIOR FUSION CERVICAL SPINE  2006  . TEE WITHOUT CARDIOVERSION N/A 03/30/2015   Procedure: TRANSESOPHAGEAL ECHOCARDIOGRAM (TEE);   Surgeon: Grace Isaac, MD;  Location: [facility];  Service: Open Heart Surgery;  Laterality: N/A;  . TESTICLE TORSION REDUCTION Right 1960  . TOE FUSION Left 1985 & 2004   "great  toe" and removal  . TONSILLECTOMY    . TOTAL HIP ARTHROPLASTY Right 10/27/2015   Procedure: RIGHT TOTAL HIP ARTHROPLASTY ANTERIOR APPROACH and steroid injection right foot;  Surgeon: Mcarthur Rossetti, MD;  Location: [facility];  Service: Orthopedics;  Laterality: Right;  . TOTAL HIP ARTHROPLASTY Left 08/13/2019  . TOTAL HIP ARTHROPLASTY Left 08/13/2019   Procedure: LEFT TOTAL HIP ARTHROPLASTY ANTERIOR APPROACH;  Surgeon: Mcarthur Rossetti, MD;  Location: [facility];  Service: Orthopedics;  Laterality: Left;   Social History   Occupational History  . Occupation: Retired Animal nutritionist  Tobacco Use  . Smoking status: Former Smoker    Packs/day: 1.00    Years: 34.00    Pack years: 34.00    Types: Cigarettes    Quit date: 06/06/1990    Years since quitting: 30.2  . Smokeless tobacco: Never Used  Vaping Use  . Vaping Use: Never used  Substance and Sexual Activity  . Alcohol use: Yes    Alcohol/week: 3.0 standard drinks    Types: 1 Cans of beer, 2 Shots of liquor per week    Comment: occassional  . Drug use: No  . Sexual activity: Yes

## 2020-09-01 ENCOUNTER — Other Ambulatory Visit: Payer: Self-pay | Admitting: Internal Medicine

## 2020-09-01 NOTE — Telephone Encounter (Signed)
Please refill as per office routine med refill policy (all routine meds refilled for 3 mo or monthly per pt preference up to one year from last visit, then month to month grace period for 3 mo, then further med refills will have to be denied)  

## 2020-09-09 DIAGNOSIS — H25813 Combined forms of age-related cataract, bilateral: Secondary | ICD-10-CM | POA: Diagnosis not present

## 2020-09-09 DIAGNOSIS — H04123 Dry eye syndrome of bilateral lacrimal glands: Secondary | ICD-10-CM | POA: Diagnosis not present

## 2020-09-09 DIAGNOSIS — H43813 Vitreous degeneration, bilateral: Secondary | ICD-10-CM | POA: Diagnosis not present

## 2020-09-09 DIAGNOSIS — H353131 Nonexudative age-related macular degeneration, bilateral, early dry stage: Secondary | ICD-10-CM | POA: Diagnosis not present

## 2020-09-16 NOTE — Progress Notes (Signed)
Xander Delany KeySarina Ill - PA Case ID: [id number] - Rx #: [id number] Need help? Call us at [phone] Outcome Approvedtoday Request Reference Number: [id number]. BOTOX INJ 200UNIT is approved through 12/16/2020. Your patient may now fill this prescription and it will be covered. Drug Botox 200UNIT solution Form [employer] Medicare Part D Electronic Prior Authorization Form (2017 [place]) Original Claim Info 60 PA Initaied

## 2020-09-18 ENCOUNTER — Other Ambulatory Visit: Payer: Self-pay | Admitting: Internal Medicine

## 2020-09-18 NOTE — Telephone Encounter (Signed)
Please refill as per office routine med refill policy (all routine meds refilled for 3 mo or monthly per pt preference up to one year from last visit, then month to month grace period for 3 mo, then further med refills will have to be denied)  

## 2020-09-23 ENCOUNTER — Ambulatory Visit: Payer: Medicare Other | Admitting: Podiatry

## 2020-09-23 ENCOUNTER — Encounter: Payer: Self-pay | Admitting: Podiatry

## 2020-09-23 ENCOUNTER — Other Ambulatory Visit: Payer: Self-pay

## 2020-09-23 DIAGNOSIS — E119 Type 2 diabetes mellitus without complications: Secondary | ICD-10-CM

## 2020-09-23 DIAGNOSIS — M2020 Hallux rigidus, unspecified foot: Secondary | ICD-10-CM | POA: Insufficient documentation

## 2020-09-23 DIAGNOSIS — M79674 Pain in right toe(s): Secondary | ICD-10-CM | POA: Diagnosis not present

## 2020-09-23 DIAGNOSIS — B351 Tinea unguium: Secondary | ICD-10-CM

## 2020-09-23 DIAGNOSIS — H33109 Unspecified retinoschisis, unspecified eye: Secondary | ICD-10-CM | POA: Insufficient documentation

## 2020-09-23 DIAGNOSIS — M79675 Pain in left toe(s): Secondary | ICD-10-CM

## 2020-09-23 NOTE — Progress Notes (Signed)
This patient returns to the office for evaluation and treatment of long thick painful nails .  This patient is unable to trim his own nails since the patient cannot reach the feet.  Patient says the nails are painful walking and wearing his shoes.  He returns for preventive foot care services.    General Appearance  Alert, conversant and in no acute stress.  Vascular  Dorsalis pedis and posterior tibial  pulses are palpable  bilaterally.  Capillary return is within normal limits  bilaterally. Temperature is within normal limits  bilaterally.  Neurologic  Senn-Weinstein monofilament wire test within normal limits  bilaterally. Muscle power within normal limits bilaterally.  Nails Thick disfigured discolored nails with subungual debris  from hallux to fifth toes bilaterally. No evidence of bacterial infection or drainage bilaterally.  Orthopedic  No limitations of motion  feet .  No crepitus or effusions noted.  No bony pathology or digital deformities noted.  Ankle/STJ immobile left foot/ankle.  Fused 1st MPJ ight foot.  Skin  normotropic skin with no porokeratosis noted bilaterally.  No signs of infections or ulcers noted.     Onychomycosis  Pain in toes right foot  Pain in toes left foot  Debridement  of nails  1-5  B/L with a nail nipper.  Nails were then filed using a dremel tool with no incidents.    RTC  4 month   Gardiner Barefoot DPM

## 2020-09-25 ENCOUNTER — Ambulatory Visit: Payer: Medicare Other | Admitting: Neurology

## 2020-09-29 ENCOUNTER — Ambulatory Visit (INDEPENDENT_AMBULATORY_CARE_PROVIDER_SITE_OTHER): Payer: Medicare Other | Admitting: Internal Medicine

## 2020-09-29 ENCOUNTER — Ambulatory Visit (INDEPENDENT_AMBULATORY_CARE_PROVIDER_SITE_OTHER): Payer: Medicare Other

## 2020-09-29 ENCOUNTER — Other Ambulatory Visit: Payer: Self-pay

## 2020-09-29 ENCOUNTER — Encounter: Payer: Self-pay | Admitting: Internal Medicine

## 2020-09-29 VITALS — BP 146/90 | HR 68 | Temp 98.7°F | Ht 68.0 in | Wt 217.0 lb

## 2020-09-29 DIAGNOSIS — R062 Wheezing: Secondary | ICD-10-CM

## 2020-09-29 DIAGNOSIS — J309 Allergic rhinitis, unspecified: Secondary | ICD-10-CM

## 2020-09-29 DIAGNOSIS — R059 Cough, unspecified: Secondary | ICD-10-CM

## 2020-09-29 DIAGNOSIS — H903 Sensorineural hearing loss, bilateral: Secondary | ICD-10-CM | POA: Insufficient documentation

## 2020-09-29 DIAGNOSIS — I10 Essential (primary) hypertension: Secondary | ICD-10-CM | POA: Diagnosis not present

## 2020-09-29 MED ORDER — PREDNISONE 10 MG PO TABS: ORAL_TABLET | ORAL | 0 refills | Status: DC

## 2020-09-29 MED ORDER — BENZONATATE 100 MG PO CAPS: 100.0000 mg | ORAL_CAPSULE | Freq: Three times a day (TID) | ORAL | 0 refills | Status: AC | PRN

## 2020-09-29 MED ORDER — METHYLPREDNISOLONE ACETATE 80 MG/ML IJ SUSP
80.0000 mg | Freq: Once | INTRAMUSCULAR | Status: AC
  Administered 2020-09-29: 80 mg via INTRAMUSCULAR

## 2020-09-29 NOTE — Progress Notes (Signed)
Patient ID: Troy Banks, male   DOB: 29-Apr-1941, 80 y.o.   MRN: [id number]        Chief Complaint: head congestoin, cough, htn       HPI:  Troy Banks is a 80 y.o. male Does have several wks ongoing nasal allergy symptoms with clearish congestion, itch and sneezing, without fever, pain, ST, swelling or wheezing, but has persistent cough to keep him up at night, non productive.  Pt denies fever, wt loss, night sweats, loss of appetite, or other constitutional symptoms  BP has been elevated at home of and on but not consistently       Wt Readings from Last 3 Encounters:  09/29/20 217 lb (98.4 kg)  08/18/20 213 lb 6.4 oz (96.8 kg)  08/06/20 219 lb 12.8 oz (99.7 kg)   BP Readings from Last 3 Encounters:  09/29/20 (!) 146/90  08/18/20 (!) 180/80  08/06/20 (!) 160/95         Past Medical History:  Diagnosis Date  . Allergy   . Anxiety    PTSD  . Aortic stenosis, moderate 03/27/2015   post AVR echo Echo 11/16: Mild LVH, EF 55-60%, normal wall motion, grade 1 diastolic dysfunction, bioprosthetic AVR okay (mean gradient 18 mmHg) with trivial AI, mild LAE, atrial septal aneurysm, small effusion   . Arthritis    "qwhere" (10/29/2015)  . Cancer ([place])    skin cancer  . Carotid artery stenosis    Carotid US 11/17: 1-39% bilateral ICA stenosis. F/u prn  . Cervical stenosis of spinal canal    fusion 2006  . CHF (congestive heart failure) ([place])    "secondry to OHS"  . Closed left pilon fracture   . COLONIC POLYPS, HX OF   . Complication of anesthesia    "takes a lot to put him under and has woken up during surgery" Difficult to wake up . PTSD; "does not metabolize RX well"that he gets "violent", per pt.   . Coronary artery disease   . DIVERTICULOSIS, COLON   . Dysrhythmia    2 bouts of afib post op and at home but corrected with medication.  . Familial tremor 03/19/2014  . Family history of adverse reaction to anesthesia    Father - hold to get asleep  . GERD  (gastroesophageal reflux disease)    uses Zantac  . GOUT   . Headache   . Heart murmur    had aortic value replacement  . High cholesterol   . History of blood transfusion   . History of kidney stones    surgery  . HYPERTENSION    off ACEI 2010 because of hyperkalemia in setting of ARI  . Hypothyroidism    newly diaganosed 09/2015  . Myocardial infarction [facility])    October 20th 2016  . OSA on CPAP   . Pneumonia   . 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 PTSD (post-traumatic stress disorder)   . PTSD (post-traumatic stress disorder)   . Sleep apnea    On a CPAP   Past Surgical History:  Procedure Laterality Date  . ANKLE FUSION Left 03/17/2017   Procedure: FUSION  PILON FRACTURE WITH BONE GRAFTING;  Surgeon: Shona Needles, MD;  Location: [place];  Service: Orthopedics;  Laterality: Left;  . ANTERIOR FUSION CERVICAL SPINE  2002  . AORTIC VALVE REPLACEMENT N/A 03/30/2015   Procedure: AORTIC VALVE REPLACEMENT (AVR);  Surgeon: Lilia Argue  Servando Snare, MD;  Location: [place];  Service: Open Heart Surgery;  Laterality: N/A;  . CARDIAC CATHETERIZATION N/A 03/26/2015   Procedure: Left Heart Cath and Coronary Angiography;  Surgeon: Leonie Man, MD;  Location: [place] CV LAB;  Service: Cardiovascular;  Laterality: N/A;  . CARDIAC VALVE REPLACEMENT    . COLONOSCOPY W/ POLYPECTOMY    . CORONARY ARTERY BYPASS GRAFT N/A 03/30/2015   Procedure: CORONARY ARTERY BYPASS GRAFTING (CABG) x four, using left internal mammary artery and left    leg greater saphenous vein harvested endoscopically ;  Surgeon: Grace Isaac, MD;  Location: [place];  Service: Open Heart Surgery;  Laterality: N/A;  . CYSTOSCOPY/URETEROSCOPY/HOLMIUM LASER/STENT PLACEMENT Bilateral 11/07/2016   Procedure: CYSTOSCOPY/URETEROSCOPY/HOLMIUM LASER/STENT PLACEMENT;  Surgeon: Nickie Retort, MD;  Location: [facility];  Service: Urology;  Laterality: Bilateral;  .  CYSTOSCOPY/URETEROSCOPY/HOLMIUM LASER/STENT PLACEMENT Bilateral 11/23/2016   Procedure: CYSTOSCOPY/BILATERAL URETEROSCOPY/HOLMIUM LASER/STENT EXCHANGE;  Surgeon: Nickie Retort, MD;  Location: [facility];  Service: Urology;  Laterality: Bilateral;  NEEDS 90 MIN   . EXTERNAL FIXATION LEG Left 02/22/2017   Procedure: EXTERNAL FIXATION LEFT ANKLE;  Surgeon: Renette Butters, MD;  Location: [place];  Service: Orthopedics;  Laterality: Left;  . HEMORRHOID BANDING  1970s  . INGUINAL HERNIA REPAIR Left 1962  . [place]   "cut me open"  . LEFT HEART CATH AND CORS/GRAFTS ANGIOGRAPHY N/A 10/25/2018   Procedure: LEFT HEART CATH AND CORS/GRAFTS ANGIOGRAPHY;  Surgeon: Leonie Man, MD;  Location: [place] [facility];  Service: Cardiovascular;  Laterality: N/A;  . POSTERIOR FUSION CERVICAL SPINE  2006  . TEE WITHOUT CARDIOVERSION N/A 03/30/2015   Procedure: TRANSESOPHAGEAL ECHOCARDIOGRAM (TEE);  Surgeon: Grace Isaac, MD;  Location: [place];  Service: Open Heart Surgery;  Laterality: N/A;  . TESTICLE TORSION REDUCTION Right 1960  . TOE FUSION Left 1985 & 2004   "great toe" and removal  . TONSILLECTOMY    . TOTAL HIP ARTHROPLASTY Right 10/27/2015   Procedure: RIGHT TOTAL HIP ARTHROPLASTY ANTERIOR APPROACH and steroid injection right foot;  Surgeon: Mcarthur Rossetti, MD;  Location: [place];  Service: Orthopedics;  Laterality: Right;  . TOTAL HIP ARTHROPLASTY Left 08/13/2019  . TOTAL HIP ARTHROPLASTY Left 08/13/2019   Procedure: LEFT TOTAL HIP ARTHROPLASTY ANTERIOR APPROACH;  Surgeon: Mcarthur Rossetti, MD;  Location: [place];  Service: Orthopedics;  Laterality: Left;    reports that he quit smoking about 30 years ago. His smoking use included cigarettes. He has a 34.00 pack-year smoking history. He has never used smokeless tobacco. He reports current alcohol use of about 3.0 standard drinks of alcohol per week. He reports that he does not use drugs. family history includes Alcohol  abuse in an other family member; Arthritis in an other family member; Diabetes in his mother and paternal grandfather; Drug abuse in his sister; Healthy in his brother, daughter, and son; Heart disease in his father, maternal grandfather, maternal grandmother, mother, and paternal grandfather; Hypertension in his father, maternal grandfather, maternal grandmother, mother, and paternal grandfather; Prostate cancer in his maternal uncle; Tremor in his sister. Allergies  Allergen Reactions  . Acyclovir And Related Other (See Comments)    Accumulates and causes stroke-like side-effects due to cytochrome P450 enzyme deficiency  . Benzodiazepines Other (See Comments)    Patient states he gets stroke symptoms with benzo's.   . Compazine [Prochlorperazine Maleate] Other (See Comments)    Accumulates and causes stroke-like side-effects due to cytochrome P450 enzyme  deficiency  . Coreg [Carvedilol] Other (See Comments)    Accumulates and causes stroke-like side-effects due to cytochrome P450 enzyme deficiency Patient poor metabolizer of CYP2D6 - Coreg undergoes extensive hepatic (including 2D6)  . Flomax [Tamsulosin Hcl] Other (See Comments)    Accumulates and causes stroke-like side-effects due to cytochrome P450 enzyme deficiency  . Lisinopril Other (See Comments)    Hyperkalemia. 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  . Mobic [Meloxicam] Other (See Comments)    Accumulates and causes stroke-like side-effects due to cytochrome P450 enzyme deficiency  . Ondansetron Other (See Comments)    Accumulates and causes stroke-like side-effects due to cytochrome P450 enzyme deficiency   . Oxycodone Other (See Comments)    Accumulates and causes stroke-like side-effects due to cytochrome P450 enzyme deficiency  . Promethazine Other (See Comments)    Accumulates and causes stroke-like side-effects due to cytochrome P450 enzyme deficiency  . Sertraline Hcl  Other (See Comments)    Accumulates and causes stroke-like side-effects due to cytochrome P450 enzyme deficiency  . Tramadol Other (See Comments)    Accumulates and causes stroke-like side-effects due to cytochrome P450 enzyme deficiency  . Atorvastatin Other (See Comments)    MYALGIAS  . Colchicine Other (See Comments)    ataxia  . Fentanyl Other (See Comments)    Accumulates and cause stroke like symptoms   Current Outpatient Medications on File Prior to Visit  Medication Sig Dispense Refill  . acetaminophen (TYLENOL) 500 MG tablet TAKE TWO TABLETS BY MOUTH EVERY 6 HOURS AS NEEDED    . allopurinol (ZYLOPRIM) 300 MG tablet TAKE 1 TABLET BY MOUTH EVERY DAY 90 tablet 0  . amLODipine (NORVASC) 10 MG tablet TAKE 1 TABLET BY MOUTH EVERY DAY 90 tablet 1  . Ascorbic Acid (VITAMIN C) 1000 MG tablet Take 1,000 mg by mouth 2 (two) times daily.     [name] aspirin EC 81 MG tablet Take 81 mg by mouth daily. Swallow whole.    [name] BOTOX 200 units SOLR     . calcium carbonate (TUMS EX) 750 MG chewable tablet Chew 1 tablet by mouth daily as needed for heartburn.     . Carboxymethylcellulose Sodium (LUBRICANT EYE DROPS OP) Place 1 drop into both eyes 2 (two) times daily as needed (dry/irritated eyes.).     . Carboxymethylcellulose Sodium 0.25 % SOLN INSTILL 1 DROP IN EACH EYE AS DIRECTED 2 TO 4 TIMES A DAY    . Cholecalciferol (VITAMIN D-3) 125 MCG (5000 UT) TABS Take 5,000 Units by mouth 2 (two) times a day.    . famotidine (PEPCID) 40 MG tablet TAKE 1 TABLET BY MOUTH EVERY DAY 90 tablet 2  . furosemide (LASIX) 40 MG tablet TAKE 1 TABLET BY MOUTH EVERY OTHER DAY 45 tablet 2  . levothyroxine (SYNTHROID) 25 MCG tablet TAKE 1 TABLET BY MOUTH EVERY DAY WITH BREAKFAST 90 tablet 3  . nitroGLYCERIN (NITROSTAT) 0.4 MG SL tablet PLACE 1 TABLET UNDER THE TONGUE EVERY 5 MINUTES AS NEEDED FOR CHEST PAIN. (Patient taking differently: Place 0.4 mg under the tongue every 5 (five) minutes as needed for chest pain.) 25 tablet 3   . polyethylene glycol powder (GLYCOLAX/MIRALAX) 17 GM/SCOOP powder TAKE 17 GRAMS BY MOUTH DAILY    . tiZANidine (ZANAFLEX) 4 MG tablet Take 1 tablet by mouth every 8 (eight) hours as needed.    . Turmeric 500 MG CAPS Take 500 mg by mouth daily.     . vitamin B-12 (CYANOCOBALAMIN) 1000  MCG tablet Take 1,000 mcg by mouth daily.     . [DISCONTINUED] ranitidine (ZANTAC) 150 MG tablet TAKE 1 TABLET BY MOUTH 2 TIMES DAILY (Patient taking differently: Take 150 mg by mouth 2 (two) times a day. ) 180 tablet 0   Current Facility-Administered Medications on File Prior to Visit  Medication Dose Route Frequency Provider Last Rate Last Admin  . sodium chloride flush (NS) 0.9 % injection 3 mL  3 mL Intravenous Q12H Meng, [place], PA            ROS:  All others reviewed and negative.  Objective        PE:  BP (!) 146/90 (BP Location: Right Arm, Patient Position: Sitting, Cuff Size: Normal)   Pulse 68   Temp 98.7 F (37.1 C) (Oral)   Ht 5\' 8"  (1.727 m)   Wt 217 lb (98.4 kg)   SpO2 96%   BMI 32.99 kg/m                 Constitutional: Pt appears in NAD               HENT: Head: NCAT.                Right Ear: External ear normal.                 Left Ear: External ear normal.                Eyes: . Pupils are equal, round, and reactive to light. Conjunctivae and EOM are normal; Bilat tm's with mild erythema.  Max sinus areas non tender.  Pharynx with mild erythema, no exudate               Nose: without d/c or deformity               Neck: Neck supple. Gross normal ROM               Cardiovascular: Normal rate and regular rhythm.                 Pulmonary/Chest: Effort normal and breath sounds without rales or wheezing.                               Neurological: Pt is alert. At baseline orientation, motor grossly intact               Skin: Skin is warm. No rashes, no other new lesions, LE edema - none               Psychiatric: Pt behavior is normal without agitation   Micro: none  Cardiac  tracings I have personally interpreted today:  none  Pertinent Radiological findings (summarize): none   Lab Results  Component Value Date   WBC 7.5 06/10/2020   HGB 15.1 06/10/2020   HCT 44.9 06/10/2020   PLT 148.0 (L) 06/10/2020   GLUCOSE 91 06/10/2020   CHOL 126 06/10/2020   TRIG 156.0 (H) 06/10/2020   HDL 40.20 06/10/2020   LDLDIRECT 63.0 12/04/2019   LDLCALC 55 06/10/2020   ALT 22 06/10/2020   AST 16 06/10/2020   NA 140 06/10/2020   K 3.9 06/10/2020   CL 103 06/10/2020   CREATININE 1.01 06/10/2020   BUN 19 06/10/2020   CO2 31 06/10/2020   TSH 2.81 06/10/2020   PSA 1.49 06/10/2020   INR 1.47 03/30/2015   HGBA1C  6.0 06/10/2020   MICROALBUR 8.8 (H) 06/10/2020   Assessment/Plan:  Troy Banks is a 80 y.o. White or Caucasian [1] male with  has a past medical history of Allergy, Anxiety, Aortic stenosis, moderate (03/27/2015), Arthritis, Cancer ([place]), Carotid artery stenosis, Cervical stenosis of spinal canal, CHF (congestive heart failure) ([place]), Closed left pilon fracture, COLONIC POLYPS, HX OF, Complication of anesthesia, Coronary artery disease, DIVERTICULOSIS, COLON, Dysrhythmia, Familial tremor (03/19/2014), Family history of adverse reaction to anesthesia, GERD (gastroesophageal reflux disease), GOUT, Headache, Heart murmur, High cholesterol, History of blood transfusion, History of kidney stones, HYPERTENSION, Hypothyroidism, Myocardial infarction ([place]), OSA on CPAP, Pneumonia, 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  Allergic rhinitis Mod to severe flare, for depomedrol IM 80, predpac asd,  to f/u any worsening symptoms or concerns  Cough Seems likely related to post nasal gtt, for tessalon perle, and cxr - r/o pna  Essential hypertension Mild uncontrolled, pt declines med change for now,  to f/u any worsening symptoms or concerns  Followup:  Return if symptoms worsen or fail to improve.  Cathlean Cower, MD 10/05/2020 9:08 PM [facility]

## 2020-09-29 NOTE — Patient Instructions (Addendum)
You had the steroid shot today ? ?Please take all new medication as prescribed - the prednisone ? ?Please continue all other medications as before, and refills have been done if requested. ? ?Please have the pharmacy call with any other refills you may need ? ?Please keep your appointments with your specialists as you may have planned ? ?Please go to the XRAY Department in the first floor for the x-ray testing ? ?You will be contacted by phone if any changes need to be made immediately.  Otherwise, you will receive a letter about your results with an explanation, but please check with MyChart first. ? ?Please remember to sign up for MyChart if you have not done so, as this will be important to you in the future with finding out test results, communicating by private email, and scheduling acute appointments online when needed. ? ? ? ? ?

## 2020-09-30 ENCOUNTER — Encounter: Payer: Self-pay | Admitting: Internal Medicine

## 2020-10-05 ENCOUNTER — Other Ambulatory Visit: Payer: Self-pay | Admitting: Cardiology

## 2020-10-05 ENCOUNTER — Encounter: Payer: Self-pay | Admitting: Internal Medicine

## 2020-10-05 DIAGNOSIS — J309 Allergic rhinitis, unspecified: Secondary | ICD-10-CM | POA: Insufficient documentation

## 2020-10-05 DIAGNOSIS — R059 Cough, unspecified: Secondary | ICD-10-CM | POA: Insufficient documentation

## 2020-10-05 NOTE — Assessment & Plan Note (Addendum)
Mod to severe flare, for depomedrol IM 80, predpac asd,  to f/u any worsening symptoms or concerns

## 2020-10-05 NOTE — Assessment & Plan Note (Signed)
Seems likely related to post nasal gtt, for tessalon perle, and cxr - r/o pna

## 2020-10-05 NOTE — Assessment & Plan Note (Signed)
Mild uncontrolled, pt declines med change for now,  to f/u any worsening symptoms or concerns

## 2020-10-16 ENCOUNTER — Ambulatory Visit (INDEPENDENT_AMBULATORY_CARE_PROVIDER_SITE_OTHER): Payer: Medicare Other | Admitting: Neurology

## 2020-10-16 ENCOUNTER — Other Ambulatory Visit: Payer: Self-pay

## 2020-10-16 DIAGNOSIS — G43709 Chronic migraine without aura, not intractable, without status migrainosus: Secondary | ICD-10-CM

## 2020-10-16 MED ORDER — ONABOTULINUMTOXINA 100 UNITS IJ SOLR
200.0000 [IU] | Freq: Once | INTRAMUSCULAR | Status: AC
  Administered 2020-10-16: 155 [IU] via INTRAMUSCULAR

## 2020-10-16 NOTE — Procedures (Signed)
[facility]   History:  Diagnosis: chronic migraine   Result History  n/a  Consent obtained from: The patient Benefits discussed included, but were not limited to decreased muscle tightness, increased joint range of motion, and decreased pain.  Risk discussed included, but were not limited pain and discomfort, bleeding, bruising, excessive weakness, venous thrombosis, muscle atrophy, asymmetry, death.  A copy of the patient medication guide was given to the patient which explains the blackbox warning.  Patients identity and treatment sites confirmed Yes.  .  Details of Procedure: Skin was cleaned with alcohol.  A 30 gauge, 6mm  needle was introduced to the target muscle.  Prior to injection, the needle plunger was aspirated to make sure the needle was not within a blood vessel.  There was no blood retrieved on aspiration.    Following is a summary of the muscles injected  And the amount of Botulinum toxin used:  Injections  Location Left  Right Units Number of sites        Corrugator 5.0 5.0 10 1 each side  Frontalis 5.0/5.0 5.0/5.0 20 2 each  procerus   5 1  temporalis 5.0 x 4 5.0 x 4 40 4 each  occipitalis 5.0x 3 5.0x 3 30 3  each  Cervical paraspinal 5.0/5.0 5.0/5.0 20 2 each  trapezius 5.0 x 3 5.0 x 3 30 3  each                          TOTAL UNITS:   155    Agent: Botulinum Type A ( Onobotulinum Toxin type A ).  200 unit vial of Botox used, each containing 50 units and freshly diluted with 2 mL of sterile, non-perserved saline   Total injected (Units): 155  Total wasted (Units): 45 Pt tolerated procedure well without complications.   Reinjection is anticipated in 3 months.

## 2020-11-04 ENCOUNTER — Ambulatory Visit: Payer: Medicare Other | Admitting: Orthopaedic Surgery

## 2020-11-04 ENCOUNTER — Other Ambulatory Visit: Payer: Self-pay

## 2020-11-04 ENCOUNTER — Ambulatory Visit: Payer: Self-pay

## 2020-11-04 DIAGNOSIS — G8929 Other chronic pain: Secondary | ICD-10-CM

## 2020-11-04 DIAGNOSIS — M5442 Lumbago with sciatica, left side: Secondary | ICD-10-CM

## 2020-11-04 DIAGNOSIS — M4807 Spinal stenosis, lumbosacral region: Secondary | ICD-10-CM

## 2020-11-04 MED ORDER — METHYLPREDNISOLONE 4 MG PO TABS: ORAL_TABLET | ORAL | 0 refills | Status: DC

## 2020-11-04 NOTE — Progress Notes (Signed)
Office Visit Note   Patient: Troy Banks           Date of Birth: Oct 13, 1940           MRN: [id number] Visit Date: 11/04/2020              Requested by: [name], MD [address] [place],  [place] [address] PCP: [name], MD   Assessment & Plan: Visit Diagnoses:  1. Chronic left-sided low back pain with left-sided sciatica     Plan: At this point a MRI of the lumbar spine is warranted given his spondylolisthesis combined with compression fractures.  We need to rule out nerve compression and help guide further treatment such as an epidural steroid injection.  I will try a 6-day steroid taper for now while we await MRI.  All question concerns were answered and addressed.  Follow-Up Instructions: No follow-ups on file.   Orders:  Orders Placed This Encounter  Procedures  . XR Lumbar Spine 2-3 Views   Meds ordered this encounter  Medications  . methylPREDNISolone (MEDROL) 4 MG tablet    Sig: Medrol dose pack. Take as instructed    Dispense:  21 tablet    Refill:  0      Procedures: No procedures performed   Clinical Data: No additional findings.   Subjective: Chief Complaint  Patient presents with  . Left Leg - Pain  . Lower Back - Pain  The patient is well-known to me.  He is a 80 year old gentleman who comes in with low back pain to the left side and sciatic as its been the worst for 3 months now.  He has tried and failed conservative treatment.  He has a positive straight leg raising on the left side and significant radicular symptoms going down his leg.  He can only take ibuprofen which she has.  He is not a diabetic.  He does have a history of compression fractures in the lumbar spine after a fall from a roof.  This was about 3 years ago.  He denies any change in bowel bladder function.  HPI  Review of Systems There is currently listed no headache, chest pain, shortness of breath, fever, chills, nausea, vomiting  Objective: Vital Signs:  There were no vitals taken for this visit.  Physical Exam He is alert and orient x3 and in no acute distress Ortho Exam He has significant pain with flexion extension and lateral rotation and lateral bending of the lumbar spine all to the left side.  He has a significantly positive straight leg raise to the left side and subjective numbness in the L4 and L5 distribution as well as some slight weakness in foot dorsiflexion.  This is on the left side as well. Specialty Comments:  No specialty comments available.  Imaging: XR Lumbar Spine 2-3 Views  Result Date: 11/04/2020 2 views of the lumbar spine show subacute compression fractures at L2 and potentially L5.  There is also grade 1 spondylolisthesis between L4 and L5.  There are degenerative changes at several levels.    PMFS History: Patient Active Problem List   Diagnosis Date Noted  . Allergic rhinitis 10/05/2020  . Cough 10/05/2020  . Sensorineural hearing loss, bilateral 09/29/2020  . Hallux rigidus 09/23/2020  . Retinoschisis 09/23/2020  . RUQ pain 06/10/2020  . History of lacunar cerebrovascular accident 04/05/2020  . Headache 03/16/2020  . Ankle pain 01/22/2020  . Hypothyroidism 11/26/2019  . Epididymitis 08/28/2019  . Unilateral  primary osteoarthritis, left hip 08/13/2019  . Status post total replacement of left hip 08/13/2019  . Abdominal wall hernia 11/02/2018  . Progressive angina ([place]) 10/25/2018  . DOE (dyspnea on exertion) 10/25/2018  . Abscess of right buttock 11/27/2017  . Acute sinus infection 11/05/2017  . Pilon fracture 03/17/2017  . Pilon fracture of left tibia 03/17/2017  . At risk for adverse drug reaction 02/28/2017  . Closed fracture dislocation of ankle joint, left, with delayed healing, subsequent encounter 02/22/2017  . Renal stone 10/26/2016  . Gross hematuria 10/26/2016  . Bladder neck obstruction 10/26/2016  . External hemorrhoid, thrombosed 06/10/2016  . Internal hemorrhoids 06/10/2016  .  Lumbar back pain 05/19/2016  . Osteoarthritis of right hip 10/27/2015  . Encounter for well adult exam with abnormal findings 10/27/2015  . Chronic chest pain 09/22/2015  . Acute gouty arthritis 06/11/2015  . Coronary artery disease involving native coronary artery with angina pectoris ([place]) 06/11/2015  . Chronic diastolic CHF (congestive heart failure) ([place]) 06/11/2015  . H/O tissue AVR Oct 2016 06/10/2015  . PAF post CABG/AVR- Amiodarone 06/10/2015  . Dyslipidemia 06/10/2015  . S/P CABG x 4-Oct 2016 03/30/2015  . H/O NSTEMI-Oct 2016 03/27/2015  . 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 Depression 09/08/2014  . GERD (gastroesophageal reflux disease)   . Essential tremor 03/31/2014  . Iliotibial band syndrome affecting left lower leg 01/01/2014  . Spondylolisthesis at L4-L5 level 01/01/2014  . Sinus bradycardia 12/31/2013  . Greater trochanteric bursitis of right hip 12/04/2013  . OSA on CPAP   . Diet-controlled diabetes mellitus ([facility] [facility]) 07/08/2009  . COLONIC POLYPS, HX OF 12/31/2008  . GOUT 12/22/2008  . Essential hypertension 12/22/2008  . Diverticulosis of colon 12/22/2008  . NEPHROLITHIASIS, HX OF 12/22/2008   Past Medical History:  Diagnosis Date  . Allergy   . Anxiety    PTSD  . Aortic stenosis, moderate 03/27/2015   post AVR echo Echo 11/16: Mild LVH, EF 55-60%, normal wall motion, grade 1 diastolic dysfunction, bioprosthetic AVR okay (mean gradient 18 mmHg) with trivial AI, mild LAE, atrial septal aneurysm, small effusion   . Arthritis    "qwhere" (10/29/2015)  . Cancer ([facility])    skin cancer  . Carotid artery stenosis    Carotid US 11/17: 1-39% bilateral ICA stenosis. F/u prn  . Cervical stenosis of spinal canal    fusion 2006  . CHF (congestive heart failure) ([place])    "secondry to OHS"  . Closed left pilon fracture   . COLONIC POLYPS, HX OF   . Complication of anesthesia    "takes a lot to put him under and has woken up during  surgery" Difficult to wake up . PTSD; "does not metabolize RX well"that he gets "violent", per pt.   . Coronary artery disease   . DIVERTICULOSIS, COLON   . Dysrhythmia    2 bouts of afib post op and at home but corrected with medication.  . Familial tremor 03/19/2014  . Family history of adverse reaction to anesthesia    Father - hold to get asleep  . GERD (gastroesophageal reflux disease)    uses Zantac  . GOUT   . Headache   . Heart murmur    had aortic value replacement  . High cholesterol   . History of blood transfusion   . History of kidney stones    surgery  . HYPERTENSION    off ACEI 2010 because of hyperkalemia in setting of ARI  .  Hypothyroidism    newly diaganosed 09/2015  . Myocardial infarction [facility])    October 20th 2016  . OSA on CPAP   . Pneumonia   . 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 PTSD (post-traumatic stress disorder)   . PTSD (post-traumatic stress disorder)   . Sleep apnea    On a CPAP    Family History  Problem Relation Age of Onset  . Heart disease Mother   . Hypertension Mother   . Diabetes Mother   . Heart disease Father   . Hypertension Father   . Tremor Sister   . Healthy Brother   . Healthy Son   . Prostate cancer Maternal Uncle   . Hypertension Maternal Grandmother   . Heart disease Maternal Grandmother   . Hypertension Maternal Grandfather   . Heart disease Maternal Grandfather   . Hypertension Paternal Grandfather   . Heart disease Paternal Grandfather   . Diabetes Paternal Grandfather   . Alcohol abuse Other   . Arthritis Other   . Drug abuse Sister   . Healthy Daughter   . Colon polyps Neg Hx   . Esophageal cancer Neg Hx   . Rectal cancer Neg Hx   . Stomach cancer Neg Hx   . Colon cancer Neg Hx     Past Surgical History:  Procedure Laterality Date  . ANKLE FUSION Left 03/17/2017   Procedure: FUSION  PILON FRACTURE WITH BONE GRAFTING;  Surgeon:  Shona Needles, MD;  Location: [facility];  Service: Orthopedics;  Laterality: Left;  . ANTERIOR FUSION CERVICAL SPINE  2002  . AORTIC VALVE REPLACEMENT N/A 03/30/2015   Procedure: AORTIC VALVE REPLACEMENT (AVR);  Surgeon: Grace Isaac, MD;  Location: [facility];  Service: Open Heart Surgery;  Laterality: N/A;  . CARDIAC CATHETERIZATION N/A 03/26/2015   Procedure: Left Heart Cath and Coronary Angiography;  Surgeon: Leonie Man, MD;  Location: [facility];  Service: Cardiovascular;  Laterality: N/A;  . CARDIAC VALVE REPLACEMENT    . COLONOSCOPY W/ POLYPECTOMY    . CORONARY ARTERY BYPASS GRAFT N/A 03/30/2015   Procedure: CORONARY ARTERY BYPASS GRAFTING (CABG) x four, using left internal mammary artery and left    leg greater saphenous vein harvested endoscopically ;  Surgeon: Grace Isaac, MD;  Location: [facility];  Service: Open Heart Surgery;  Laterality: N/A;  . CYSTOSCOPY/URETEROSCOPY/HOLMIUM LASER/STENT PLACEMENT Bilateral 11/07/2016   Procedure: CYSTOSCOPY/URETEROSCOPY/HOLMIUM LASER/STENT PLACEMENT;  Surgeon: Nickie Retort, MD;  Location: [facility];  Service: Urology;  Laterality: Bilateral;  . CYSTOSCOPY/URETEROSCOPY/HOLMIUM LASER/STENT PLACEMENT Bilateral 11/23/2016   Procedure: CYSTOSCOPY/BILATERAL URETEROSCOPY/HOLMIUM LASER/STENT EXCHANGE;  Surgeon: Nickie Retort, MD;  Location: [facility];  Service: Urology;  Laterality: Bilateral;  NEEDS 90 MIN   . EXTERNAL FIXATION LEG Left 02/22/2017   Procedure: EXTERNAL FIXATION LEFT ANKLE;  Surgeon: Renette Butters, MD;  Location: [facility];  Service: Orthopedics;  Laterality: Left;  . HEMORRHOID BANDING  1970s  . INGUINAL HERNIA REPAIR Left 1962  . [place]   "cut me open"  . LEFT HEART CATH AND CORS/GRAFTS ANGIOGRAPHY N/A 10/25/2018   Procedure: LEFT HEART CATH AND CORS/GRAFTS ANGIOGRAPHY;  Surgeon: Leonie Man, MD;  Location: [facility];  Service: Cardiovascular;  Laterality: N/A;  . POSTERIOR FUSION  CERVICAL SPINE  2006  . TEE WITHOUT CARDIOVERSION N/A 03/30/2015   Procedure: TRANSESOPHAGEAL ECHOCARDIOGRAM (TEE);  Surgeon: Grace Isaac, MD;  Location[facility];  Service: Open Heart Surgery;  Laterality: N/A;  . TESTICLE TORSION REDUCTION Right 1960  . TOE FUSION Left 1985 & 2004   "great toe" and removal  . TONSILLECTOMY    . TOTAL HIP ARTHROPLASTY Right 10/27/2015   Procedure: RIGHT TOTAL HIP ARTHROPLASTY ANTERIOR APPROACH and steroid injection right foot;  Surgeon: Mcarthur Rossetti, MD;  Location: [facility];  Service: Orthopedics;  Laterality: Right;  . TOTAL HIP ARTHROPLASTY Left 08/13/2019  . TOTAL HIP ARTHROPLASTY Left 08/13/2019   Procedure: LEFT TOTAL HIP ARTHROPLASTY ANTERIOR APPROACH;  Surgeon: Mcarthur Rossetti, MD;  Location: [place];  Service: Orthopedics;  Laterality: Left;   Social History   Occupational History  . Occupation: Retired Animal nutritionist  Tobacco Use  . Smoking status: Former Smoker    Packs/day: 1.00    Years: 34.00    Pack years: 34.00    Types: Cigarettes    Quit date: 06/06/1990    Years since quitting: 30.4  . Smokeless tobacco: Never Used  Vaping Use  . Vaping Use: Never used  Substance and Sexual Activity  . Alcohol use: Yes    Alcohol/week: 3.0 standard drinks    Types: 1 Cans of beer, 2 Shots of liquor per week    Comment: occassional  . Drug use: No  . Sexual activity: Yes

## 2020-11-10 NOTE — Progress Notes (Signed)
Assessment/Plan:    1.  Essential Tremor  -Status post focused ultrasound to the right VIM at [facility] in January, 2022. I do think he is more imbalanced than he was prior to the surgery, but he thinks that is because of knee and back pain, for which he is seeing ortho and has MRI lumbar spine scheduled. He mentioned that he is considering left VIM surgery. I discussed with him that this is currently not FDA approved because of significant balance change associated with bilateral focused ultrasound. However, [facility] may be doing some clinical trials in that regard that I am unfamiliar with.  -Patient initially thought that he would like to restart primidone because of right hand tremor.  This hand is not the dominant hand, and likely we will not need as much primidone.  However, after discussion he decided to hold off on that and see how he does.  Tremor on the R is pretty mild.  If we need it in the future, we will likely only need 50 mg qd to bid.    2.  Chronic migraine  -We will continue Botox.  His first round was Oct 16, 2020 with minimal effectiveness.  Discussed whether or not to continue at least one more round and he decided to trial that on time.  If not helpful, we will see if he can take the CGRP inhibs.  -Has difficulty with preventative medications because of slow metabolizer in the cytochrome P450 system  Subjective:   Troy Banks was seen today in follow up for follow-up chronic migraine, status post Botox on Oct 16, 2020.  Pt with sig other, who supplements hx.  Reports that headaches continue to be same/consistent.  Headaches are R temporo-parietal and rarely go to behind the R eye, and if he has that it will create nausea and photosensitivity.    Last visit, we slowly weaned the patient off of his primidone, as he was status post focused ultrasound on the right.  However, he wanted to discuss getting back on some primidone because of tremor in the right hand.  Today, he states  that he wasn't convinced that he wanted to go back on the medication.  Having some balance issues - think due to knee and back issues.  Dr. Rush Farmer just ordered Mri lumbar spine, to be done tomorrow.  Current prescribed movement disorder medications: None (previously primidone 250 mg twice per day)   PREVIOUS MEDICATIONS: Not beta-blocker candidate because of bradycardia; not topiramate candidate because of history of nephrolithiasis.  ALLERGIES:   Allergies  Allergen Reactions   Acyclovir And Related Other (See Comments)    Accumulates and causes stroke-like side-effects due to cytochrome P450 enzyme deficiency   Benzodiazepines Other (See Comments)    Patient states he gets stroke symptoms with benzo's.    Compazine [Prochlorperazine Maleate] Other (See Comments)    Accumulates and causes stroke-like side-effects due to cytochrome P450 enzyme deficiency   Coreg [Carvedilol] Other (See Comments)    Accumulates and causes stroke-like side-effects due to cytochrome P450 enzyme deficiency Patient poor metabolizer of CYP2D6 - Coreg undergoes extensive hepatic (including 2D6)   Flomax [Tamsulosin Hcl] Other (See Comments)    Accumulates and causes stroke-like side-effects due to cytochrome P450 enzyme deficiency   Lisinopril Other (See Comments)    Hyperkalemia. 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   Mobic [Meloxicam] Other (See Comments)    Accumulates and causes stroke-like side-effects due to cytochrome P450  enzyme deficiency   Ondansetron Other (See Comments)    Accumulates and causes stroke-like side-effects due to cytochrome P450 enzyme deficiency    Oxycodone Other (See Comments)    Accumulates and causes stroke-like side-effects due to cytochrome P450 enzyme deficiency   Promethazine Other (See Comments)    Accumulates and causes stroke-like side-effects due to cytochrome P450 enzyme deficiency   Sertraline Hcl Other (See  Comments)    Accumulates and causes stroke-like side-effects due to cytochrome P450 enzyme deficiency   Tramadol Other (See Comments)    Accumulates and causes stroke-like side-effects due to cytochrome P450 enzyme deficiency   Atorvastatin Other (See Comments)    MYALGIAS   Colchicine Other (See Comments)    ataxia   Fentanyl Other (See Comments)    Accumulates and cause stroke like symptoms    CURRENT MEDICATIONS:  Outpatient Encounter Medications as of 11/16/2020  Medication Sig   acetaminophen (TYLENOL) 500 MG tablet TAKE TWO TABLETS BY MOUTH EVERY 6 HOURS AS NEEDED   allopurinol (ZYLOPRIM) 300 MG tablet TAKE 1 TABLET BY MOUTH EVERY DAY   amLODipine (NORVASC) 10 MG tablet TAKE 1 TABLET BY MOUTH EVERY DAY   Ascorbic Acid (VITAMIN C) 1000 MG tablet Take 1,000 mg by mouth 2 (two) times daily.    aspirin EC 81 MG tablet Take 81 mg by mouth daily. Swallow whole.   benzonatate (TESSALON PERLES) 100 MG capsule Take 1 capsule (100 mg total) by mouth 3 (three) times daily as needed for cough.   BOTOX 200 units SOLR    calcium carbonate (TUMS EX) 750 MG chewable tablet Chew 1 tablet by mouth daily as needed for heartburn.    Carboxymethylcellulose Sodium (LUBRICANT EYE DROPS OP) Place 1 drop into both eyes 2 (two) times daily as needed (dry/irritated eyes.).    Carboxymethylcellulose Sodium 0.25 % SOLN INSTILL 1 DROP IN EACH EYE AS DIRECTED 2 TO 4 TIMES A DAY   Cholecalciferol (VITAMIN D-3) 125 MCG (5000 UT) TABS Take 5,000 Units by mouth 2 (two) times a day.   famotidine (PEPCID) 40 MG tablet TAKE 1 TABLET BY MOUTH EVERY DAY   furosemide (LASIX) 40 MG tablet TAKE 1 TABLET BY MOUTH EVERY OTHER DAY   levothyroxine (SYNTHROID) 25 MCG tablet TAKE 1 TABLET BY MOUTH EVERY DAY WITH BREAKFAST   nitroGLYCERIN (NITROSTAT) 0.4 MG SL tablet PLACE 1 TABLET UNDER THE TONGUE EVERY 5 MINUTES AS NEEDED FOR CHEST PAIN. (Patient taking differently: Place 0.4 mg under the tongue every 5 (five) minutes as needed  for chest pain.)   polyethylene glycol powder (GLYCOLAX/MIRALAX) 17 GM/SCOOP powder TAKE 17 GRAMS BY MOUTH DAILY   rosuvastatin (CRESTOR) 10 MG tablet TAKE 1/2 TABLET BY MOUTH EVERY DAY   tiZANidine (ZANAFLEX) 4 MG tablet Take 1 tablet by mouth every 8 (eight) hours as needed.   Turmeric 500 MG CAPS Take 500 mg by mouth daily.    vitamin B-12 (CYANOCOBALAMIN) 1000 MCG tablet Take 1,000 mcg by mouth daily.    [DISCONTINUED] methylPREDNISolone (MEDROL) 4 MG tablet Medrol dose pack. Take as instructed (Patient not taking: Reported on 11/16/2020)   [DISCONTINUED] predniSONE (DELTASONE) 10 MG tablet 3 tabs by mouth per day for 3 days,2tabs per day for 3 days,1tab per day for 3 days (Patient not taking: Reported on 11/16/2020)   [DISCONTINUED] ranitidine (ZANTAC) 150 MG tablet TAKE 1 TABLET BY MOUTH 2 TIMES DAILY (Patient taking differently: Take 150 mg by mouth 2 (two) times a day. )   Facility-Administered Encounter Medications as  of 11/16/2020  Medication   sodium chloride flush (NS) 0.9 % injection 3 mL     Objective:    PHYSICAL EXAMINATION:    VITALS:   Vitals:   11/16/20 0838  BP: (!) 142/70  Pulse: 74  SpO2: 97%  Weight: 212 lb (96.2 kg)  Height: 5\' 8"  (1.727 m)    GEN:  The patient appears stated age and is in NAD. HEENT:  Normocephalic, atraumatic.  The mucous membranes are moist. The superficial temporal arteries are without ropiness or tenderness. CV:  RRR Lungs:  CTAB Neck/HEME:  There are no carotid bruits bilaterally. No occipital notch tenderness.  Neurological examination:  Orientation: The patient is alert and oriented x3. Cranial nerves: There is good facial symmetry. The speech is fluent and clear. Soft palate rises symmetrically and there is no tongue deviation. Hearing is intact to conversational tone. Sensation: Sensation is intact to light touch throughout Motor: Strength is at least antigravity x4.  Movement examination: Tone: There is normal tone in the  UE/LE Abnormal movements: There is no rest or intention tremor in the left hand. There is intention tremor on the right but it is very mild. Archimedes spirals are drawn fairly well bilaterally, with mild tremor. Coordination:  There is no decremation with RAM's,  Gait and Station: The patient is wide-based and slightly unbalanced (he attributes this to knee pain).  He does better the longer he walks I have reviewed and interpreted the following labs independently   Chemistry      Component Value Date/Time   NA 140 06/10/2020 1524   NA 143 10/19/2018 1411   K 3.9 06/10/2020 1524   CL 103 06/10/2020 1524   CO2 31 06/10/2020 1524   BUN 19 06/10/2020 1524   BUN 17 10/19/2018 1411   CREATININE 1.01 06/10/2020 1524   CREATININE 0.94 03/04/2016 0918      Component Value Date/Time   CALCIUM 9.1 06/10/2020 1524   ALKPHOS 54 06/10/2020 1524   AST 16 06/10/2020 1524   ALT 22 06/10/2020 1524   BILITOT 0.8 06/10/2020 1524      Lab Results  Component Value Date   WBC 7.5 06/10/2020   HGB 15.1 06/10/2020   HCT 44.9 06/10/2020   MCV 93.4 06/10/2020   PLT 148.0 (L) 06/10/2020   Lab Results  Component Value Date   TSH 2.81 06/10/2020     Chemistry      Component Value Date/Time   NA 140 06/10/2020 1524   NA 143 10/19/2018 1411   K 3.9 06/10/2020 1524   CL 103 06/10/2020 1524   CO2 31 06/10/2020 1524   BUN 19 06/10/2020 1524   BUN 17 10/19/2018 1411   CREATININE 1.01 06/10/2020 1524   CREATININE 0.94 03/04/2016 0918      Component Value Date/Time   CALCIUM 9.1 06/10/2020 1524   ALKPHOS 54 06/10/2020 1524   AST 16 06/10/2020 1524   ALT 22 06/10/2020 1524   BILITOT 0.8 06/10/2020 1524      Lab Results  Component Value Date   ESRSEDRATE 4 07/22/2020      Total time spent on today's visit was 32 minutes, including both face-to-face time and nonface-to-face time.  Time included that spent on review of records (prior notes available to me/labs/imaging if pertinent),  discussing treatment and goals, answering patient's questions and coordinating care.  Cc:  [name], MD

## 2020-11-16 ENCOUNTER — Other Ambulatory Visit: Payer: Self-pay

## 2020-11-16 ENCOUNTER — Encounter: Payer: Self-pay | Admitting: Neurology

## 2020-11-16 ENCOUNTER — Ambulatory Visit: Payer: Medicare Other | Admitting: Neurology

## 2020-11-16 VITALS — BP 142/70 | HR 74 | Ht 68.0 in | Wt 212.0 lb

## 2020-11-16 DIAGNOSIS — G43709 Chronic migraine without aura, not intractable, without status migrainosus: Secondary | ICD-10-CM | POA: Diagnosis not present

## 2020-11-16 DIAGNOSIS — G250 Essential tremor: Secondary | ICD-10-CM | POA: Diagnosis not present

## 2020-11-17 ENCOUNTER — Ambulatory Visit
Admission: RE | Admit: 2020-11-17 | Discharge: 2020-11-17 | Disposition: A | Discharge status: Home / Self Care | Payer: Medicare Other | Source: Ambulatory Visit | Attending: Orthopaedic Surgery | Admitting: Orthopaedic Surgery

## 2020-11-17 DIAGNOSIS — M545 Low back pain, unspecified: Secondary | ICD-10-CM | POA: Diagnosis not present

## 2020-11-17 DIAGNOSIS — M4807 Spinal stenosis, lumbosacral region: Secondary | ICD-10-CM

## 2020-11-17 DIAGNOSIS — M48061 Spinal stenosis, lumbar region without neurogenic claudication: Secondary | ICD-10-CM | POA: Diagnosis not present

## 2020-11-23 ENCOUNTER — Encounter: Payer: Self-pay | Admitting: Orthopaedic Surgery

## 2020-11-23 ENCOUNTER — Other Ambulatory Visit: Payer: Self-pay

## 2020-11-23 ENCOUNTER — Ambulatory Visit: Payer: Medicare Other | Admitting: Orthopaedic Surgery

## 2020-11-23 DIAGNOSIS — M4807 Spinal stenosis, lumbosacral region: Secondary | ICD-10-CM

## 2020-11-23 DIAGNOSIS — M5442 Lumbago with sciatica, left side: Secondary | ICD-10-CM | POA: Diagnosis not present

## 2020-11-23 DIAGNOSIS — G8929 Other chronic pain: Secondary | ICD-10-CM | POA: Diagnosis not present

## 2020-11-23 NOTE — Progress Notes (Signed)
The patient is very well-known to me.  He is 80 years old and feels young except for the pain he is experiencing in his low back to the left side.  He has limitations on any type of medications that he can take.  I fracture replaced his left hip and that is done well.  He still consistent with the pain being in the left side of the low back that wraps around to his left side.  There is no rash in this area.  It does radiate down his left leg.  I sent him for an MRI at this point to help hopefully guide an intervention such as an injection in his spine.  He has old compression deformities at L2 and L4 and L5 from a fall that happened in 2018.  On exam he still has consistent pain to the left side at the lower lumbar spine that radiates around to the mid side area.  He has a positive straight leg raise on that side.  The MRI of the spine does shows old compression deformities that are endplate deformities at L2 as well as L4 and L5.  These are healed and unchanged in 2018.  There are multiple levels where he has some stenosis especially L2-L3 but some at L4-L5.  There is a anterolisthesis of 5 mm at L4-L5 and chronic facet arthropathy.  This certainly could be a level that he is hurting at as well as above at L3-L4.  With the severe pain that he is having, I would like to send him to outpatient physical therapy for any modalities that can help decrease the pain in his lumbar spine the left side and this will be per the therapist discretion.  I would also like to send him to Dr. Ernestina Patches for a left-sided injection at I think L for L5 with it being an ESI versus a facet injection based on his signs and symptoms.  I will also leave that up to Dr. Romona Curls discretion as well.  A follow-up appointment can to be established with me anywhere from 2 to 4 weeks after that injection.  The patient and his wife agree with this treatment plan.  All questions and concerns were answered and addressed.

## 2020-11-26 ENCOUNTER — Encounter: Payer: Self-pay | Admitting: Internal Medicine

## 2020-11-26 ENCOUNTER — Ambulatory Visit (INDEPENDENT_AMBULATORY_CARE_PROVIDER_SITE_OTHER): Payer: Medicare Other | Admitting: Internal Medicine

## 2020-11-26 ENCOUNTER — Other Ambulatory Visit: Payer: Self-pay

## 2020-11-26 VITALS — BP 148/86 | HR 64 | Temp 98.3°F | Ht 68.0 in | Wt 213.0 lb

## 2020-11-26 DIAGNOSIS — E119 Type 2 diabetes mellitus without complications: Secondary | ICD-10-CM

## 2020-11-26 DIAGNOSIS — I10 Essential (primary) hypertension: Secondary | ICD-10-CM

## 2020-11-26 DIAGNOSIS — E785 Hyperlipidemia, unspecified: Secondary | ICD-10-CM | POA: Diagnosis not present

## 2020-11-26 DIAGNOSIS — Z461 Encounter for fitting and adjustment of hearing aid: Secondary | ICD-10-CM | POA: Insufficient documentation

## 2020-11-26 DIAGNOSIS — Z86010 Personal history of colonic polyps: Secondary | ICD-10-CM

## 2020-11-26 DIAGNOSIS — I700 Atherosclerosis of aorta: Secondary | ICD-10-CM | POA: Diagnosis not present

## 2020-11-26 DIAGNOSIS — Z460 Encounter for fitting and adjustment of spectacles and contact lenses: Secondary | ICD-10-CM | POA: Insufficient documentation

## 2020-11-26 DIAGNOSIS — Z7189 Other specified counseling: Secondary | ICD-10-CM | POA: Diagnosis not present

## 2020-11-26 DIAGNOSIS — E039 Hypothyroidism, unspecified: Secondary | ICD-10-CM | POA: Diagnosis not present

## 2020-11-26 LAB — T4, FREE: Free T4: 0.83 ng/dL (ref 0.60–1.60)

## 2020-11-26 LAB — TSH: TSH: 3.19 u[IU]/mL (ref 0.35–4.50)

## 2020-11-26 NOTE — Patient Instructions (Signed)
Please continue all other medications as before, and refills have been done if requested.  Please have the pharmacy call with any other refills you may need.  Please continue your efforts at being more active, low cholesterol diet, and weight control.  You are otherwise up to date with prevention measures today.  Please keep your appointments with your specialists as you may have planned  You will be contacted regarding the referral for: colonoscopy with Dr Loletha Carrow for oct 2022  Your out of hospital DNR form is signed  Please make an Appointment to return about Jan 2023, or sooner if needed

## 2020-11-26 NOTE — Progress Notes (Signed)
Patient ID: Troy Banks, male   DOB: 1941-04-03, 80 y.o.   MRN: [id number]        Chief Complaint: follow up HTN, HLD and hyperglycemia and DNR discussion       HPI:  Troy Banks is a 80 y.o. male here with above, Pt denies chest pain, increased sob or doe, wheezing, orthopnea, PND, increased LE swelling, palpitations, dizziness or syncope.   Pt denies polydipsia, polyuria, or new focal neuro s/s.     Bp at home has been good at home.  Has ongong left lumbar neuritic pain for ESI soon and start PT per surgury.  Pt requests out of hospital DNR form signed as he wishes no heroic measures in the event of cardiopulm insufficiecy to include no intubation, compression, epi and defibrillation.  Due for colonoscopy with Dr Loletha Carrow oct 2022.  No other new complaints Wt Readings from Last 3 Encounters:  11/26/20 213 lb (96.6 kg)  11/16/20 212 lb (96.2 kg)  09/29/20 217 lb (98.4 kg)   BP Readings from Last 3 Encounters:  11/26/20 (!) 148/86  11/16/20 (!) 142/70  09/29/20 (!) 146/90         Past Medical History:  Diagnosis Date   Allergy    Anxiety    PTSD   Aortic stenosis, moderate 03/27/2015   post AVR echo Echo 11/16: Mild LVH, EF 55-60%, normal wall motion, grade 1 diastolic dysfunction, bioprosthetic AVR okay (mean gradient 18 mmHg) with trivial AI, mild LAE, atrial septal aneurysm, small effusion    Arthritis    "qwhere" (10/29/2015)   Cancer ([place])    skin cancer   Carotid artery stenosis    Carotid US 11/17: 1-39% bilateral ICA stenosis. F/u prn   Cervical stenosis of spinal canal    fusion 2006   CHF (congestive heart failure) ([place])    "secondry to O[facility]"   Closed left pilon fracture    COLONIC POLYPS, HX OF    Complication of anesthesia    "takes a lot to put him under and has woken up during surgery" Difficult to wake up . PTSD; "does not metabolize RX well"that he gets "violent", per pt.    Coronary artery disease    DIVERTICULOSIS, COLON    Dysrhythmia    2 bouts of  afib post op and at home but corrected with medication.   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 ([place])    confirmed heterozygous 10/24/14 labs   PTSD (post-traumatic stress disorder)    PTSD (post-traumatic stress disorder)    Sleep apnea    On a CPAP   Past Surgical History:  Procedure Laterality Date   ANKLE FUSION Left 03/17/2017   Procedure: FUSION  PILON FRACTURE WITH BONE GRAFTING;  Surgeon: Shona Needles, MD;  Location: [place];  Service: Orthopedics;  Laterality: Left;   ANTERIOR FUSION CERVICAL SPINE  2002   AORTIC VALVE REPLACEMENT N/A 03/30/2015   Procedure: AORTIC VALVE REPLACEMENT (AVR);  Surgeon: Grace Isaac, MD;  Location: [place];  Service: Open Heart Surgery;  Laterality: N/A;   CARDIAC CATHETERIZATION N/A 03/26/2015   Procedure: Left Heart Cath and Coronary Angiography;  Surgeon: Leonie Man, MD;  Location: [place] [facility];  Service: Cardiovascular;  Laterality: N/A;   CARDIAC VALVE REPLACEMENT     COLONOSCOPY W/ POLYPECTOMY     CORONARY ARTERY BYPASS GRAFT N/A 03/30/2015   Procedure: CORONARY ARTERY BYPASS GRAFTING (CABG) x four, using left internal mammary artery and left    leg greater saphenous vein harvested endoscopically ;  Surgeon: Grace Isaac, MD;  Location: [place];  Service: Open Heart Surgery;  Laterality: N/A;   CYSTOSCOPY/URETEROSCOPY/HOLMIUM LASER/STENT PLACEMENT Bilateral 11/07/2016   Procedure:  CYSTOSCOPY/URETEROSCOPY/HOLMIUM LASER/STENT PLACEMENT;  Surgeon: Nickie Retort, MD;  Location: [facility];  Service: Urology;  Laterality: Bilateral;   CYSTOSCOPY/URETEROSCOPY/HOLMIUM LASER/STENT PLACEMENT Bilateral 11/23/2016   Procedure: CYSTOSCOPY/BILATERAL URETEROSCOPY/HOLMIUM LASER/STENT EXCHANGE;  Surgeon: Nickie Retort, MD;  Location: [facility];  Service: Urology;  Laterality: Bilateral;  NEEDS 90 MIN    EXTERNAL FIXATION LEG Left 02/22/2017   Procedure: EXTERNAL FIXATION LEFT ANKLE;  Surgeon: Renette Butters, MD;  Location: [place];  Service: Orthopedics;  Laterality: Left;   HEMORRHOID BANDING  1970s   INGUINAL HERNIA REPAIR Left [place]   "cut me open"   LEFT HEART CATH AND CORS/GRAFTS ANGIOGRAPHY N/A 10/25/2018   Procedure: LEFT HEART CATH AND CORS/GRAFTS ANGIOGRAPHY;  Surgeon: Leonie Man, MD;  Location: [place] [facility];  Service: Cardiovascular;  Laterality: N/A;   POSTERIOR FUSION CERVICAL SPINE  2006   TEE WITHOUT CARDIOVERSION N/A 03/30/2015   Procedure: TRANSESOPHAGEAL ECHOCARDIOGRAM (TEE);  Surgeon: Grace Isaac, MD;  Location: [place];  Service: Open Heart Surgery;  Laterality: N/A;   TESTICLE TORSION REDUCTION Right 1960   TOE FUSION Left 1985 & 2004   "great toe" and removal   TONSILLECTOMY     TOTAL HIP ARTHROPLASTY Right 10/27/2015   Procedure: RIGHT TOTAL HIP ARTHROPLASTY ANTERIOR APPROACH and steroid injection right foot;  Surgeon: Mcarthur Rossetti, MD;  Location: [place];  Service: Orthopedics;  Laterality: Right;   TOTAL HIP ARTHROPLASTY Left 08/13/2019   TOTAL HIP ARTHROPLASTY Left 08/13/2019   Procedure: LEFT TOTAL HIP ARTHROPLASTY ANTERIOR APPROACH;  Surgeon: Mcarthur Rossetti, MD;  Location: [place];  Service: Orthopedics;  Laterality: Left;    reports that he quit smoking about 30 years ago. His smoking use included cigarettes. He has a 34.00 pack-year smoking history. He has never used smokeless tobacco. He reports  current alcohol use of about 3.0 standard drinks of alcohol per week. He reports that he does not use drugs. family history includes Alcohol abuse in an other family member; Arthritis in an other family member; Diabetes in his mother and paternal grandfather; Drug abuse in his sister; Healthy in his brother, daughter, and son; Heart disease in his father, maternal grandfather, maternal grandmother, mother, and paternal grandfather; Hypertension in his father, maternal grandfather, maternal grandmother, mother, and paternal grandfather; Prostate cancer in his maternal uncle; Tremor in his sister. Allergies  Allergen Reactions   Acyclovir And Related Other (See Comments)    Accumulates and causes stroke-like side-effects due to cytochrome P450 enzyme deficiency   Benzodiazepines Other (  See Comments)    Patient states he gets stroke symptoms with benzo's.    Compazine [Prochlorperazine Maleate] Other (See Comments)    Accumulates and causes stroke-like side-effects due to cytochrome P450 enzyme deficiency   Coreg [Carvedilol] Other (See Comments)    Accumulates and causes stroke-like side-effects due to cytochrome P450 enzyme deficiency Patient poor metabolizer of CYP2D6 - Coreg undergoes extensive hepatic (including 2D6)   Flomax [Tamsulosin Hcl] Other (See Comments)    Accumulates and causes stroke-like side-effects due to cytochrome P450 enzyme deficiency   Lisinopril Other (See Comments)    Hyperkalemia. 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   Mobic [Meloxicam] Other (See Comments)    Accumulates and causes stroke-like side-effects due to cytochrome P450 enzyme deficiency   Ondansetron Other (See Comments)    Accumulates and causes stroke-like side-effects due to cytochrome P450 enzyme deficiency    Oxycodone Other (See Comments)    Accumulates and causes stroke-like side-effects due to cytochrome P450 enzyme deficiency   Promethazine  Other (See Comments)    Accumulates and causes stroke-like side-effects due to cytochrome P450 enzyme deficiency   Sertraline Hcl Other (See Comments)    Accumulates and causes stroke-like side-effects due to cytochrome P450 enzyme deficiency   Tramadol Other (See Comments)    Accumulates and causes stroke-like side-effects due to cytochrome P450 enzyme deficiency   Atorvastatin Other (See Comments)    MYALGIAS   Colchicine Other (See Comments)    ataxia   Fentanyl Other (See Comments)    Accumulates and cause stroke like symptoms   Current Outpatient Medications on File Prior to Visit  Medication Sig Dispense Refill   acetaminophen (TYLENOL) 500 MG tablet TAKE TWO TABLETS BY MOUTH EVERY 6 HOURS AS NEEDED     allopurinol (ZYLOPRIM) 300 MG tablet TAKE 1 TABLET BY MOUTH EVERY DAY 90 tablet 0   amLODipine (NORVASC) 10 MG tablet TAKE 1 TABLET BY MOUTH EVERY DAY 90 tablet 1   Ascorbic Acid (VITAMIN C) 1000 MG tablet Take 1,000 mg by mouth 2 (two) times daily.      aspirin EC 81 MG tablet Take 81 mg by mouth daily. Swallow whole.     BOTOX 200 units SOLR      calcium carbonate (TUMS EX) 750 MG chewable tablet Chew 1 tablet by mouth daily as needed for heartburn.      Carboxymethylcellulose Sodium (LUBRICANT EYE DROPS OP) Place 1 drop into both eyes 2 (two) times daily as needed (dry/irritated eyes.).      Carboxymethylcellulose Sodium 0.25 % SOLN INSTILL 1 DROP IN EACH EYE AS DIRECTED 2 TO 4 TIMES A DAY     Cholecalciferol (VITAMIN D-3) 125 MCG (5000 UT) TABS Take 5,000 Units by mouth 2 (two) times a day.     famotidine (PEPCID) 40 MG tablet TAKE 1 TABLET BY MOUTH EVERY DAY 90 tablet 2   furosemide (LASIX) 40 MG tablet TAKE 1 TABLET BY MOUTH EVERY OTHER DAY 45 tablet 2   levothyroxine (SYNTHROID) 25 MCG tablet TAKE 1 TABLET BY MOUTH EVERY DAY WITH BREAKFAST 90 tablet 3   nitroGLYCERIN (NITROSTAT) 0.4 MG SL tablet PLACE 1 TABLET UNDER THE TONGUE EVERY 5 MINUTES AS NEEDED FOR CHEST PAIN. (Patient  taking differently: Place 0.4 mg under the tongue every 5 (five) minutes as needed for chest pain.) 25 tablet 3   polyethylene glycol powder (GLYCOLAX/MIRALAX) 17 GM/SCOOP powder TAKE 17 GRAMS BY MOUTH DAILY     polyethylene glycol powder (GLYCOLAX/MIRALAX)  17 GM/SCOOP powder Take by mouth.     rosuvastatin (CRESTOR) 10 MG tablet TAKE 1/2 TABLET BY MOUTH EVERY DAY 45 tablet 3   tiZANidine (ZANAFLEX) 4 MG tablet Take 1 tablet by mouth every 8 (eight) hours as needed.     Turmeric 500 MG CAPS Take 500 mg by mouth daily.      vitamin B-12 (CYANOCOBALAMIN) 1000 MCG tablet Take 1,000 mcg by mouth daily.      benzonatate (TESSALON PERLES) 100 MG capsule Take 1 capsule (100 mg total) by mouth 3 (three) times daily as needed for cough. (Patient not taking: Reported on 11/26/2020) 60 capsule 0   [DISCONTINUED] ranitidine (ZANTAC) 150 MG tablet TAKE 1 TABLET BY MOUTH 2 TIMES DAILY (Patient taking differently: Take 150 mg by mouth 2 (two) times a day. ) 180 tablet 0   Current Facility-Administered Medications on File Prior to Visit  Medication Dose Route Frequency Provider Last Rate Last Admin   sodium chloride flush (NS) 0.9 % injection 3 mL  3 mL Intravenous Q12H Meng, [place], [place]            ROS:  All others reviewed and negative.  Objective        PE:  BP (!) 148/86 (BP Location: Left Arm, Patient Position: Sitting, Cuff Size: Large)   Pulse 64   Temp 98.3 F (36.8 C) (Oral)   Ht 5\' 8"  (1.727 m)   Wt 213 lb (96.6 kg)   SpO2 95%   BMI 32.39 kg/m                 Constitutional: Pt appears in NAD               HENT: Head: NCAT.                Right Ear: External ear normal.                 Left Ear: External ear normal.                Eyes: . Pupils are equal, round, and reactive to light. Conjunctivae and EOM are normal               Nose: without d/c or deformity               Neck: Neck supple. Gross normal ROM               Cardiovascular: Normal rate and regular rhythm.                  Pulmonary/Chest: Effort normal and breath sounds without rales or wheezing.                Abd:  Soft, NT, ND, + BS, no organomegaly               Neurological: Pt is alert. At baseline orientation, motor grossly intact               Skin: Skin is warm. No rashes, no other new lesions, LE edema - none               Psychiatric: Pt behavior is normal without agitation   Micro: none  Cardiac tracings I have personally interpreted today:  none  Pertinent Radiological findings (summarize): none   Lab Results  Component Value Date   WBC 7.5 06/10/2020   HGB 15.1 06/10/2020   HCT 44.9 06/10/2020   PLT 148.0 (L) 06/10/2020  GLUCOSE 91 06/10/2020   CHOL 126 06/10/2020   TRIG 156.0 (H) 06/10/2020   HDL 40.20 06/10/2020   LDLDIRECT 63.0 12/04/2019   LDLCALC 55 06/10/2020   ALT 22 06/10/2020   AST 16 06/10/2020   NA 140 06/10/2020   K 3.9 06/10/2020   CL 103 06/10/2020   CREATININE 1.01 06/10/2020   BUN 19 06/10/2020   CO2 31 06/10/2020   TSH 3.19 11/26/2020   PSA 1.49 06/10/2020   INR 1.47 03/30/2015   HGBA1C 6.0 06/10/2020   MICROALBUR 8.8 (H) 06/10/2020   Assessment/Plan:  ARGEL PABLO is a 80 y.o. White or Caucasian [1] male with  has a past medical history of Allergy, Anxiety, Aortic stenosis, moderate (03/27/2015), Arthritis, Cancer (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  Diet-controlled diabetes mellitus ([place]) . Lab Results  Component Value Date   HGBA1C 6.0 06/10/2020   Stable, pt to continue current medical treatment  - diet  DNR (do not resuscitate) discussion Ok for out hosp DNR form signed  Dyslipidemia Lab Results  Component Value Date   [place] 55 06/10/2020   Stable, pt to continue current statin crestor 10   Essential hypertension BP Readings from Last 3 Encounters:  11/26/20 (!) 148/86  11/16/20 (!) 142/70  09/29/20 (!) 146/90   Stable, pt to continue medical treatment amlodipine 10, declines any med change today   Aortic atherosclerosis ([facility]) To continue diet, exercise, low chol diet and crestor 10  Followup: Return in about 6 months (around 06/07/2021).  Cathlean Cower, MD 11/28/2020 9:41 PM [facility]

## 2020-11-27 ENCOUNTER — Encounter: Payer: Self-pay | Admitting: Internal Medicine

## 2020-11-28 ENCOUNTER — Encounter: Payer: Self-pay | Admitting: Internal Medicine

## 2020-11-28 NOTE — Assessment & Plan Note (Signed)
.   Lab Results  Component Value Date   HGBA1C 6.0 06/10/2020   Stable, pt to continue current medical treatment  - diet

## 2020-11-28 NOTE — Assessment & Plan Note (Signed)
[place] for out hosp DNR form signed

## 2020-11-28 NOTE — Assessment & Plan Note (Signed)
BP Readings from Last 3 Encounters:  11/26/20 (!) 148/86  11/16/20 (!) 142/70  09/29/20 (!) 146/90   Stable, pt to continue medical treatment amlodipine 10, declines any med change today

## 2020-11-28 NOTE — Assessment & Plan Note (Signed)
Lab Results  Component Value Date   LDLCALC 55 06/10/2020   Stable, pt to continue current statin crestor 10

## 2020-11-28 NOTE — Assessment & Plan Note (Signed)
To continue diet, exercise, low chol diet and crestor 10

## 2020-11-30 ENCOUNTER — Other Ambulatory Visit: Payer: Self-pay

## 2020-11-30 ENCOUNTER — Encounter: Payer: Self-pay | Admitting: Physical Therapy

## 2020-11-30 ENCOUNTER — Ambulatory Visit (INDEPENDENT_AMBULATORY_CARE_PROVIDER_SITE_OTHER): Payer: Medicare Other | Admitting: Physical Therapy

## 2020-11-30 DIAGNOSIS — M6281 Muscle weakness (generalized): Secondary | ICD-10-CM

## 2020-11-30 DIAGNOSIS — G8929 Other chronic pain: Secondary | ICD-10-CM | POA: Diagnosis not present

## 2020-11-30 DIAGNOSIS — M545 Low back pain, unspecified: Secondary | ICD-10-CM | POA: Diagnosis not present

## 2020-11-30 DIAGNOSIS — R29898 Other symptoms and signs involving the musculoskeletal system: Secondary | ICD-10-CM | POA: Diagnosis not present

## 2020-11-30 NOTE — Patient Instructions (Signed)
Access Code: [email or web address] URL: [email or web address] Date: 11/30/2020 Prepared by: Faustino Congress  Exercises Seated Figure 4 Piriformis Stretch - 2 x daily - 7 x weekly - 1 sets - 3 reps - 20 -30 sec hold Seated Hamstring Stretch - 2 x daily - 7 x weekly - 1 sets - 3 reps - 20 - 30 sec hold Seated Trunk Rotation - Arms Crossed - 2 x daily - 7 x weekly - 1 sets - 5 reps - 5 sec hold hold Self Release - 2 x daily - 7 x weekly - 1 sets - 1 reps - 2-5 min hold  Patient Education Trigger Point Dry Needling

## 2020-11-30 NOTE — Therapy (Signed)
[facility] [address] [place], [place], [address] Phone: [phone]   Fax:  [phone]  Physical Therapy Evaluation  Patient Details  Name: Troy Banks MRN: [id number] Date of Birth: 04-Nov-1940 Referring Provider (PT): Mcarthur Rossetti, MD   Encounter Date: 11/30/2020   PT End of Session - 11/30/20 1503     Visit Number 1    Number of Visits 12    Date for PT Re-Evaluation 01/11/21    Authorization Type [employer] Medicare    Progress Note Due on Visit 10    PT Start Time 1420    PT Stop Time 1500    PT Time Calculation (min) 40 min    Activity Tolerance Patient tolerated treatment well    Behavior During Therapy [facility] for tasks assessed/performed             Past Medical History:  Diagnosis Date   Allergy    Anxiety    PTSD   Aortic stenosis, moderate 03/27/2015   post AVR echo Echo 11/16: Mild LVH, EF 55-60%, normal wall motion, grade 1 diastolic dysfunction, bioprosthetic AVR okay (mean gradient 18 mmHg) with trivial AI, mild LAE, atrial septal aneurysm, small effusion    Arthritis    "qwhere" (10/29/2015)   Cancer ([place])    skin cancer   Carotid artery stenosis    Carotid US 11/17: 1-39% bilateral ICA stenosis. F/u prn   Cervical stenosis of spinal canal    fusion 2006   CHF (congestive heart failure) ([place])    "secondry to OHS"   Closed left pilon fracture    COLONIC POLYPS, HX OF    Complication of anesthesia    "takes a lot to put him under and has woken up during surgery" Difficult to wake up . PTSD; "does not metabolize RX well"that he gets "violent", per pt.    Coronary artery disease    DIVERTICULOSIS, COLON    Dysrhythmia    2 bouts of afib post op and at home but corrected with medication.   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 ([place])    confirmed heterozygous 10/24/14 labs   PTSD (post-traumatic stress disorder)    PTSD (post-traumatic stress disorder)    Sleep apnea    On a CPAP    Past Surgical History:  Procedure Laterality Date   ANKLE FUSION Left 03/17/2017   Procedure: FUSION  PILON FRACTURE WITH BONE GRAFTING;  Surgeon: Shona Needles, MD;  Location: [place];  Service: Orthopedics;  Laterality: Left;   ANTERIOR FUSION CERVICAL SPINE  2002   AORTIC VALVE REPLACEMENT N/A 03/30/2015   Procedure: AORTIC VALVE REPLACEMENT (AVR);  Surgeon: Grace Isaac, MD;  Location: [place];  Service: Open Heart Surgery;  Laterality: N/A;   CARDIAC CATHETERIZATION  N/A 03/26/2015   Procedure: Left Heart Cath and Coronary Angiography;  Surgeon: Leonie Man, MD;  Location: [place] [facility];  Service: Cardiovascular;  Laterality: N/A;   CARDIAC VALVE REPLACEMENT     COLONOSCOPY W/ POLYPECTOMY     CORONARY ARTERY BYPASS GRAFT N/A 03/30/2015   Procedure: CORONARY ARTERY BYPASS GRAFTING (CABG) x four, using left internal mammary artery and left    leg greater saphenous vein harvested endoscopically ;  Surgeon: Grace Isaac, MD;  Location: [place];  Service: Open Heart Surgery;  Laterality: N/A;   CYSTOSCOPY/URETEROSCOPY/HOLMIUM LASER/STENT PLACEMENT Bilateral 11/07/2016   Procedure: CYSTOSCOPY/URETEROSCOPY/HOLMIUM LASER/STENT PLACEMENT;  Surgeon: Nickie Retort, MD;  Location: [facility];  Service: Urology;  Laterality: Bilateral;   CYSTOSCOPY/URETEROSCOPY/HOLMIUM LASER/STENT PLACEMENT Bilateral 11/23/2016   Procedure: CYSTOSCOPY/BILATERAL  URETEROSCOPY/HOLMIUM LASER/STENT EXCHANGE;  Surgeon: Nickie Retort, MD;  Location: [facility];  Service: Urology;  Laterality: Bilateral;  NEEDS 90 MIN    EXTERNAL FIXATION LEG Left 02/22/2017   Procedure: EXTERNAL FIXATION LEFT ANKLE;  Surgeon: Renette Butters, MD;  Location: [place];  Service: Orthopedics;  Laterality: Left;   HEMORRHOID BANDING  1970s   INGUINAL HERNIA REPAIR Left [place]   "cut me open"   LEFT HEART CATH AND CORS/GRAFTS ANGIOGRAPHY N/A 10/25/2018   Procedure: LEFT HEART CATH AND CORS/GRAFTS ANGIOGRAPHY;  Surgeon: Leonie Man, MD;  Location: [employer] [facility];  Service: Cardiovascular;  Laterality: N/A;   POSTERIOR FUSION CERVICAL SPINE  2006   TEE WITHOUT CARDIOVERSION N/A 03/30/2015   Procedure: TRANSESOPHAGEAL ECHOCARDIOGRAM (TEE);  Surgeon: Grace Isaac, MD;  Location: [place];  Service: Open Heart Surgery;  Laterality: N/A;   TESTICLE TORSION REDUCTION Right 1960   TOE FUSION Left 1985 & 2004   "great toe" and removal   TONSILLECTOMY     TOTAL HIP ARTHROPLASTY Right 10/27/2015   Procedure: RIGHT TOTAL HIP ARTHROPLASTY ANTERIOR APPROACH and steroid injection right foot;  Surgeon: Mcarthur Rossetti, MD;  Location: [place];  Service: Orthopedics;  Laterality: Right;   TOTAL HIP ARTHROPLASTY Left 08/13/2019   TOTAL HIP ARTHROPLASTY Left 08/13/2019   Procedure: LEFT TOTAL HIP ARTHROPLASTY ANTERIOR APPROACH;  Surgeon: Mcarthur Rossetti, MD;  Location: [place];  Service: Orthopedics;  Laterality: Left;    There were no vitals filed for this visit.    Subjective Assessment - 11/30/20 1416     Subjective Pt is a 80 y/o male who presents to OPPT for chronic Lt sided LBP x 6 months with no known injury.  He has a hx of Lt ankle pilon fx in 2019 so possible that his back pain has impacted his back.    Pertinent History anxiety (PTSD), moderate aortic stenosis, arthritis, carotid artery stenosis, CHF, CAD, GOUT, HTN, MI, bil THA, ACDF     Limitations Sitting;Standing;Walking    How long can you walk comfortably? 1/2-3/4 mile    Diagnostic tests MRI: L4-5: Chronic facet arthropathy with 5 mm of anterolisthesis.  Moderate stenosis of the lateral recesses and neural foramina.  Definite neural compression is not established but neural irritation  could occur.     L2-3: Bilateral lateral recess and foraminal stenosis right more  than left. Some potential to affect the exiting L2 nerves,  particularly on right.     L1-2: Mild multifactorial canal stenosis but without distinct focal  neural compression.     L3-4: Disc bulge. Facet hypertrophy. Mild lateral recess and  foraminal stenosis but  without distinct neural compression.    Patient Stated Goals improve pain    Currently in Pain? Yes    Pain Score 5    up to 10/10; at best 2-3/10   Pain Location Back    Pain Orientation Left;Lower    Pain Descriptors / Indicators Sore;Dull    Pain Type Chronic pain    Pain Radiating Towards reports occasional Lt leg weakness on uneven surfaces    Pain Onset More than a month ago    Pain Frequency Constant    Aggravating Factors  walking    Pain Relieving Factors "I just bear with it until it eases off."                [facility] PT Assessment - 11/30/20 1417       Assessment   Medical Diagnosis M48.07 (ICD-10-CM) - Spinal stenosis of lumbosacral region  M54.42,G89.29 (ICD-10-CM) - Chronic left-sided low back pain with left-sided sciatica    Referring Provider (PT) Mcarthur Rossetti, MD    Onset Date/Surgical Date --   6 months   Hand Dominance Left    Next MD Visit 12/14/20 - Dr. Ernestina Patches    Prior Therapy [facility] in 2019      Precautions   Precautions None      Restrictions   Weight Bearing Restrictions No      Balance Screen   Has the patient fallen in the past 6 months No    Has the patient had a decrease in activity level because of a fear of falling?  No    Is the patient reluctant to leave their home because of a fear  of falling?  No      Home Environment   Living Environment Private residence    Living Arrangements Spouse/significant other    Type of [place] to enter    Entrance Stairs-Number of Steps 4    Entrance Stairs-Rails Right;Left;Can reach both    [place] One level    Additional Comments denies difficulty with 4 stairs - has some difficulty with > 7 steps      Prior Function   Level of Independence Independent    Vocation Retired    [employer] retired long distance truck driving    Leisure ride motorcycle, no regular exercise; daily walking 5-10 min      Cognition   Overall Cognitive Status Within Functional Limits for tasks assessed      Observation/Other Assessments   Focus on Therapeutic Outcomes (FOTO)  47 (predicted 59)      Posture/Postural Control   Posture/Postural Control Postural limitations    Postural Limitations Rounded Shoulders;Forward head      ROM / Strength   AROM / PROM / Strength AROM;Strength      AROM   Overall AROM Comments Lt ankle limited motion due to prior surgery    AROM Assessment Site Lumbar    Lumbar Flexion WNL with pain returning to standing    Lumbar Extension WNL with pain    Lumbar - Right Side Bend WNL with increased pain    Lumbar - Left Side Bend WNL with mild pain    Lumbar - Right Rotation WNL    Lumbar - Left Rotation WNL with increase pain on Lt      Strength   Overall Strength Comments LLE grossly 4/5 with pain; RLE 5/5      Flexibility   Soft Tissue Assessment /Muscle Length  yes    Hamstrings tightness bil    Piriformis tightness Lt > Rt      Palpation   Spinal mobility hypomobile - pain with lumbar CPA grade 1 mobs    Palpation comment trigger points noted in Lt glute med and piriformis      Special Tests    Special Tests Lumbar    Lumbar Tests Slump Test      Slump test   Findings Negative    Comment ant hip pain bil - no change with decreasing neural tension       Ambulation/Gait   Gait Pattern Decreased stance time - left;Decreased step length - right;Lateral trunk lean to left;Trendelenburg                        Objective measurements completed on examination: See above findings.       [facility] Adult PT Treatment/Exercise - 11/30/20 1417       Exercises   Exercises Other Exercises    Other Exercises  see pt instructions - reviewed and performed 1-3 reps of each exercise with mod cues                    PT Education - 11/30/20 1503     Education Details HEP, DN    Person(s) Educated Patient;Spouse    Methods Explanation;Demonstration;Handout    Comprehension Verbalized understanding;Returned demonstration;Need further instruction              PT Short Term Goals - 11/30/20 1418       PT SHORT TERM GOAL #1   Title He will be independent with initial HEP     Time 3    Period Weeks    Status New    Target Date 12/21/20      PT SHORT TERM GOAL #2   Title -      PT SHORT TERM GOAL #3   Title -               PT Long Term Goals - 11/30/20 1419       PT LONG TERM GOAL #1   Title He will be independent with all HEP issued    Baseline -    Time 6    Period Weeks    Status New    Target Date 01/11/21      PT LONG TERM GOAL #2   Title FOTO score improved to 59 for improved function    Time 6    Period Weeks    Status New    Target Date 01/11/21      PT LONG TERM GOAL #3   Title report pain < 4/10 with walking up to 3/4 mile for improved function    Time 6    Period Weeks    Status New    Target Date 01/11/21      PT LONG TERM GOAL #4   Title demonstrate 4+/5 LLE strength without increase in pain for improved function    Time 6    Period Weeks    Status New    Target Date 01/11/21      PT LONG TERM GOAL #5   Title -                    Plan - 11/30/20 1503     Clinical Impression Statement Pt is a 80 y/o male who presents to OPPT for Lt sided LBP x  6 months without  known injury.  He demonstrates decreased strength and flexibility, hypomobility and trigger points affecting functional mobility.  He will benefit from PT to address deficits listed.    Personal Factors and Comorbidities Comorbidity 3+    Comorbidities anxiety (PTSD), moderate aortic stenosis, arthritis, carotid artery stenosis, CHF, CAD, GOUT, HTN, MI, bil THA, ACDF, Lt ankle fusion    Examination-Activity Limitations Sit;Sleep;Bend;Squat;Stairs;Stand;Transfers;Locomotion Level    Examination-Participation Restrictions Community Activity;Yard Work;Meal Prep    Stability/Clinical Decision Making Evolving/Moderate complexity    Clinical Decision Making Moderate    Rehab Potential Good    PT Frequency 2x / week    PT Duration 6 weeks    PT Treatment/Interventions ADLs/Self Care Home Management;Cryotherapy;Electrical Stimulation;Moist Heat;Traction;Therapeutic exercise;Therapeutic activities;Functional mobility training;Stair training;Gait training;Neuromuscular re-education;Patient/family education;Manual techniques;Taping;Dry needling;Passive range of motion;Spinal Manipulations    PT Next Visit Plan review HEP, possible DN to Lt glute/piriformis, LE strengthening, manual/modalities PRN    PT Home Exercise Plan Access Code: [email or web address]    Consulted and Agree with Plan of Care Patient             Patient will benefit from skilled therapeutic intervention in order to improve the following deficits and impairments:  Hypomobility, Pain, Increased fascial restricitons, Increased muscle spasms, Difficulty walking, Decreased mobility, Decreased strength, Decreased range of motion, Impaired flexibility, Postural dysfunction  Visit Diagnosis: Chronic left-sided low back pain without sciatica - Plan: PT plan of care cert/re-cert  Muscle weakness (generalized) - Plan: PT plan of care cert/re-cert  Other symptoms and signs involving the musculoskeletal system - Plan: PT plan of care  cert/re-cert     Problem List Patient Active Problem List   Diagnosis Date Noted   Encounter for fitting and adjustment of hearing aid 11/26/2020   Encounter for fitting and adjustment of spectacles and contact lenses 11/26/2020   DNR (do not resuscitate) discussion 11/26/2020   Aortic atherosclerosis ([place]) 11/26/2020   Allergic rhinitis 10/05/2020   Cough 10/05/2020   Sensorineural hearing loss, bilateral 09/29/2020   Hallux rigidus 09/23/2020   Retinoschisis 09/23/2020   RUQ pain 06/10/2020   History of lacunar cerebrovascular accident 04/05/2020   Headache 03/16/2020   Ankle pain 01/22/2020   Hypothyroidism 11/26/2019   Epididymitis 08/28/2019   Unilateral primary osteoarthritis, left hip 08/13/2019   Status post total replacement of left hip 08/13/2019   Abdominal wall hernia 11/02/2018   Progressive angina ([place]) 10/25/2018   DOE (dyspnea on exertion) 10/25/2018   Abscess of right buttock 11/27/2017   Acute sinus infection 11/05/2017   Pilon fracture 03/17/2017   Pilon fracture of left tibia 03/17/2017   At risk for adverse drug reaction 02/28/2017   Closed fracture dislocation of ankle joint, left, with delayed healing, subsequent encounter 02/22/2017   Renal stone 10/26/2016   Gross hematuria 10/26/2016   Bladder neck obstruction 10/26/2016   External hemorrhoid, thrombosed 06/10/2016   Internal hemorrhoids 06/10/2016   Lumbar back pain 05/19/2016   Osteoarthritis of right hip 10/27/2015   Encounter for well adult exam with abnormal findings 10/27/2015   Chronic chest pain 09/22/2015   Acute gouty arthritis 06/11/2015   Coronary artery disease involving native coronary artery with angina pectoris ([place]) 06/11/2015   Chronic diastolic CHF (congestive heart failure) ([place]) 06/11/2015   H/O tissue AVR Oct 2016 06/10/2015   PAF post CABG/AVR- Amiodarone 06/10/2015   Dyslipidemia 06/10/2015   S/P CABG x 4-Oct 2016 03/30/2015   H/O NSTEMI-Oct 2016 03/27/2015   Poor  drug metabolizer due to cytochrome p450  CYP2D6 variant ([place])    Depression 09/08/2014   GERD (gastroesophageal reflux disease)    Essential tremor 03/31/2014   Iliotibial band syndrome affecting left lower leg 01/01/2014   Spondylolisthesis at L4-L5 level 01/01/2014   Sinus bradycardia 12/31/2013   Greater trochanteric bursitis of right hip 12/04/2013   OSA on CPAP    Diet-controlled diabetes mellitus ([place]) 07/08/2009   COLONIC POLYPS, HX OF 12/31/2008   GOUT 12/22/2008   Essential hypertension 12/22/2008   Diverticulosis of colon 12/22/2008   NEPHROLITHIASIS, HX OF 12/22/2008      Laureen Abrahams, PT, DPT 11/30/20 3:12 PM    [facility] [address] [place], [place], [address] Phone: [phone]   Fax:  [phone]  Name: FABIEN TRAVELSTEAD MRN: [id number] Date of Birth: 02-Jan-1941

## 2020-12-08 ENCOUNTER — Encounter: Payer: Medicare Other | Admitting: Rehabilitative and Restorative Service Providers"

## 2020-12-09 ENCOUNTER — Encounter: Payer: Self-pay | Admitting: *Deleted

## 2020-12-09 NOTE — Progress Notes (Signed)
[place] Key: [id number] - PA Case ID: [id number] Need help? Call us at [phone] Outcome Approvedtoday Request Reference Number: [id number]. BOTOX INJ 200UNIT is approved through 03/11/2021. Your patient may now fill this prescription and it will be covered. Drug Botox 200UNIT solution Form [employer] Medicare Part D Electronic Prior Authorization Form (2017 NCPDP)  Will need to set up delivery with specialty pharmacy for appointment after 01/15/2021. The Botox for 01/15/2021 is already in the fridge.

## 2020-12-09 NOTE — Progress Notes (Signed)
Received a fax from [employer] that the current PA will expire soon. This is common for first time injections to only approve 2 treatments then reassess if working before renewing PA. He is receiving his 2nd initial dose on 01/15/21. If he decides against continuation we will cancel this request. I clicked renew on [employer] and the following key was initiated:  Troy Banks Key: [id number] help? Call us at [phone] Status Sent to [employer] Drug Botox 200UNIT solution Form [employer] Medicare Part D Electronic Prior Authorization Form (2017 NCPDP)

## 2020-12-10 ENCOUNTER — Encounter: Payer: Self-pay | Admitting: Physical Therapy

## 2020-12-10 ENCOUNTER — Ambulatory Visit: Payer: Medicare Other | Admitting: Physical Therapy

## 2020-12-10 ENCOUNTER — Encounter: Payer: Medicare Other | Admitting: Rehabilitative and Restorative Service Providers"

## 2020-12-10 ENCOUNTER — Other Ambulatory Visit: Payer: Self-pay

## 2020-12-10 DIAGNOSIS — R29898 Other symptoms and signs involving the musculoskeletal system: Secondary | ICD-10-CM

## 2020-12-10 DIAGNOSIS — G8929 Other chronic pain: Secondary | ICD-10-CM

## 2020-12-10 DIAGNOSIS — M6281 Muscle weakness (generalized): Secondary | ICD-10-CM

## 2020-12-10 DIAGNOSIS — M545 Low back pain, unspecified: Secondary | ICD-10-CM | POA: Diagnosis not present

## 2020-12-10 NOTE — Therapy (Signed)
[facility] [address] [place], [place], [address] Phone: [phone]   Fax:  [phone]  Physical Therapy Treatment  Patient Details  Name: Troy Banks MRN: [id number] Date of Birth: 07/18/1940 Referring Provider (PT): Mcarthur Rossetti, MD   Encounter Date: 12/10/2020   PT End of Session - 12/10/20 1249     Visit Number 2    Number of Visits 12    Date for PT Re-Evaluation 01/11/21    Authorization Type [employer] Medicare    Progress Note Due on Visit 10    PT Start Time 1145    PT Stop Time 1229    PT Time Calculation (min) 44 min    Activity Tolerance Patient tolerated treatment well    Behavior During Therapy [facility] for tasks assessed/performed             Past Medical History:  Diagnosis Date   Allergy    Anxiety    PTSD   Aortic stenosis, moderate 03/27/2015   post AVR echo Echo 11/16: Mild LVH, EF 55-60%, normal wall motion, grade 1 diastolic dysfunction, bioprosthetic AVR okay (mean gradient 18 mmHg) with trivial AI, mild LAE, atrial septal aneurysm, small effusion    Arthritis    "qwhere" (10/29/2015)   Cancer ([place])    skin cancer   Carotid artery stenosis    Carotid US 11/17: 1-39% bilateral ICA stenosis. F/u prn   Cervical stenosis of spinal canal    fusion 2006   CHF (congestive heart failure) ([place])    "secondry to OHS"   Closed left pilon fracture    COLONIC POLYPS, HX OF    Complication of anesthesia    "takes a lot to put him under and has woken up during surgery" Difficult to wake up . PTSD; "does not metabolize RX well"that he gets "violent", per pt.    Coronary artery disease    DIVERTICULOSIS, COLON    Dysrhythmia    2 bouts of afib post op and at home but corrected with medication.   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 ([place])    confirmed heterozygous 10/24/14 labs   PTSD (post-traumatic stress disorder)    PTSD (post-traumatic stress disorder)    Sleep apnea    On a CPAP    Past Surgical History:  Procedure Laterality Date   ANKLE FUSION Left 03/17/2017   Procedure: FUSION  PILON FRACTURE WITH BONE GRAFTING;  Surgeon: Shona Needles, MD;  Location: [place];  Service: Orthopedics;  Laterality: Left;   ANTERIOR FUSION CERVICAL SPINE  2002   AORTIC VALVE REPLACEMENT N/A 03/30/2015   Procedure: AORTIC VALVE REPLACEMENT (AVR);  Surgeon: Grace Isaac, MD;  Location: [place];  Service: Open Heart Surgery;  Laterality: N/A;   CARDIAC CATHETERIZATION  N/A 03/26/2015   Procedure: Left Heart Cath and Coronary Angiography;  Surgeon: Leonie Man, MD;  Location: [place] [facility];  Service: Cardiovascular;  Laterality: N/A;   CARDIAC VALVE REPLACEMENT     COLONOSCOPY W/ POLYPECTOMY     CORONARY ARTERY BYPASS GRAFT N/A 03/30/2015   Procedure: CORONARY ARTERY BYPASS GRAFTING (CABG) x four, using left internal mammary artery and left    leg greater saphenous vein harvested endoscopically ;  Surgeon: Grace Isaac, MD;  Location: [place];  Service: Open Heart Surgery;  Laterality: N/A;   CYSTOSCOPY/URETEROSCOPY/HOLMIUM LASER/STENT PLACEMENT Bilateral 11/07/2016   Procedure: CYSTOSCOPY/URETEROSCOPY/HOLMIUM LASER/STENT PLACEMENT;  Surgeon: Nickie Retort, MD;  Location: [facility];  Service: Urology;  Laterality: Bilateral;   CYSTOSCOPY/URETEROSCOPY/HOLMIUM LASER/STENT PLACEMENT Bilateral 11/23/2016   Procedure: CYSTOSCOPY/BILATERAL  URETEROSCOPY/HOLMIUM LASER/STENT EXCHANGE;  Surgeon: Nickie Retort, MD;  Location: [facility];  Service: Urology;  Laterality: Bilateral;  NEEDS 90 MIN    EXTERNAL FIXATION LEG Left 02/22/2017   Procedure: EXTERNAL FIXATION LEFT ANKLE;  Surgeon: Renette Butters, MD;  Location: [place];  Service: Orthopedics;  Laterality: Left;   HEMORRHOID BANDING  1970s   INGUINAL HERNIA REPAIR Left [place]   "cut me open"   LEFT HEART CATH AND CORS/GRAFTS ANGIOGRAPHY N/A 10/25/2018   Procedure: LEFT HEART CATH AND CORS/GRAFTS ANGIOGRAPHY;  Surgeon: Leonie Man, MD;  Location: [employer] [facility];  Service: Cardiovascular;  Laterality: N/A;   POSTERIOR FUSION CERVICAL SPINE  2006   TEE WITHOUT CARDIOVERSION N/A 03/30/2015   Procedure: TRANSESOPHAGEAL ECHOCARDIOGRAM (TEE);  Surgeon: Grace Isaac, MD;  Location: [place];  Service: Open Heart Surgery;  Laterality: N/A;   TESTICLE TORSION REDUCTION Right 1960   TOE FUSION Left 1985 & 2004   "great toe" and removal   TONSILLECTOMY     TOTAL HIP ARTHROPLASTY Right 10/27/2015   Procedure: RIGHT TOTAL HIP ARTHROPLASTY ANTERIOR APPROACH and steroid injection right foot;  Surgeon: Mcarthur Rossetti, MD;  Location: [place];  Service: Orthopedics;  Laterality: Right;   TOTAL HIP ARTHROPLASTY Left 08/13/2019   TOTAL HIP ARTHROPLASTY Left 08/13/2019   Procedure: LEFT TOTAL HIP ARTHROPLASTY ANTERIOR APPROACH;  Surgeon: Mcarthur Rossetti, MD;  Location: [place];  Service: Orthopedics;  Laterality: Left;    There were no vitals filed for this visit.   Subjective Assessment - 12/10/20 1148     Subjective reports not real change in the back yet, is doing exercises at home 2-3x a day.    Pertinent History anxiety (PTSD), moderate aortic stenosis, arthritis, carotid artery stenosis, CHF, CAD, GOUT, HTN, MI, bil THA, ACDF    Limitations Sitting;Standing;Walking    How long can you walk comfortably? 1/2-3/4 mile    Diagnostic tests MRI:  L4-5: Chronic facet arthropathy with 5 mm of anterolisthesis.  Moderate stenosis of the lateral recesses and neural foramina.  Definite neural compression is not established but neural irritation  could occur.     L2-3: Bilateral lateral recess and foraminal stenosis right more  than left. Some potential to affect the exiting L2 nerves,  particularly on right.     L1-2: Mild multifactorial canal stenosis but without distinct focal  neural compression.     L3-4: Disc bulge. Facet hypertrophy. Mild lateral recess and  foraminal stenosis but without distinct neural compression.    Patient Stated Goals improve pain    Currently in Pain? Yes    Pain Score 3  Pain Location Back    Pain Orientation Left;Lower    Pain Descriptors / Indicators Dull;Sore    Pain Type Chronic pain    Pain Onset More than a month ago    Pain Frequency Constant    Aggravating Factors  walking    Pain Relieving Factors rest                               [facility] Adult PT Treatment/Exercise - 12/10/20 1209       Exercises   Exercises Lumbar      Lumbar Exercises: Stretches   Passive Hamstring Stretch Left;2 reps;30 seconds    Passive Hamstring Stretch Limitations seated    Lower Trunk Rotation 2 reps;30 seconds    Lower Trunk Rotation Limitations bil; seated    Piriformis Stretch Left;3 reps;30 seconds      Lumbar Exercises: Aerobic   Nustep L6 x 8 min      Manual Therapy   Manual Therapy Soft tissue mobilization    Soft tissue mobilization Lt glute med with compression              Trigger Point Dry Needling - 12/10/20 1207     Consent Given? Yes    Education Handout Provided Yes    Muscles Treated Back/Hip Gluteus medius    Electrical Stimulation Performed with Dry Needling Yes    E-stim with Dry Needling Details Lt glute med to tolerance    Gluteus Medius Response Twitch response elicited                    PT Short Term Goals - 12/10/20 1249       PT SHORT  TERM GOAL #1   Title He will be independent with initial HEP     Time 3    Period Weeks    Status Achieved    Target Date 12/21/20      PT SHORT TERM GOAL #2   Title -      PT SHORT TERM GOAL #3   Title -               PT Long Term Goals - 11/30/20 1419       PT LONG TERM GOAL #1   Title He will be independent with all HEP issued    Baseline -    Time 6    Period Weeks    Status New    Target Date 01/11/21      PT LONG TERM GOAL #2   Title FOTO score improved to 59 for improved function    Time 6    Period Weeks    Status New    Target Date 01/11/21      PT LONG TERM GOAL #3   Title report pain < 4/10 with walking up to 3/4 mile for improved function    Time 6    Period Weeks    Status New    Target Date 01/11/21      PT LONG TERM GOAL #4   Title demonstrate 4+/5 LLE strength without increase in pain for improved function    Time 6    Period Weeks    Status New    Target Date 01/11/21      PT LONG TERM GOAL #5   Title -                   Plan -  12/10/20 1249     Clinical Impression Statement Pt has met STG #1 and is compliant with HEP.  Trial of DN/manual therapy and estim today to see if this helps his pain with positive response at end of session.  Will continue to benefit from PT to maximize function.    Personal Factors and Comorbidities Comorbidity 3+    Comorbidities anxiety (PTSD), moderate aortic stenosis, arthritis, carotid artery stenosis, CHF, CAD, GOUT, HTN, MI, bil THA, ACDF, Lt ankle fusion    Examination-Activity Limitations Sit;Sleep;Bend;Squat;Stairs;Stand;Transfers;Locomotion Level    Examination-Participation Restrictions Community Activity;Yard Work;Meal Prep    Stability/Clinical Decision Making Evolving/Moderate complexity    Rehab Potential Good    PT Frequency 2x / week    PT Duration 6 weeks    PT Treatment/Interventions ADLs/Self Care Home Management;Cryotherapy;Electrical Stimulation;Moist  Heat;Traction;Therapeutic exercise;Therapeutic activities;Functional mobility training;Stair training;Gait training;Neuromuscular re-education;Patient/family education;Manual techniques;Taping;Dry needling;Passive range of motion;Spinal Manipulations    PT Next Visit Plan assess response DN to Lt glute, LE strengthening, manual/modalities PRN    PT Home Exercise Plan Access Code: [email or web address]    Consulted and Agree with Plan of Care Patient             Patient will benefit from skilled therapeutic intervention in order to improve the following deficits and impairments:  Hypomobility, Pain, Increased fascial restricitons, Increased muscle spasms, Difficulty walking, Decreased mobility, Decreased strength, Decreased range of motion, Impaired flexibility, Postural dysfunction  Visit Diagnosis: Chronic left-sided low back pain without sciatica  Muscle weakness (generalized)  Other symptoms and signs involving the musculoskeletal system     Problem List Patient Active Problem List   Diagnosis Date Noted   Encounter for fitting and adjustment of hearing aid 11/26/2020   Encounter for fitting and adjustment of spectacles and contact lenses 11/26/2020   DNR (do not resuscitate) discussion 11/26/2020   Aortic atherosclerosis ([place]) 11/26/2020   Allergic rhinitis 10/05/2020   Cough 10/05/2020   Sensorineural hearing loss, bilateral 09/29/2020   Hallux rigidus 09/23/2020   Retinoschisis 09/23/2020   RUQ pain 06/10/2020   History of lacunar cerebrovascular accident 04/05/2020   Headache 03/16/2020   Ankle pain 01/22/2020   Hypothyroidism 11/26/2019   Epididymitis 08/28/2019   Unilateral primary osteoarthritis, left hip 08/13/2019   Status post total replacement of left hip 08/13/2019   Abdominal wall hernia 11/02/2018   Progressive angina ([place]) 10/25/2018   DOE (dyspnea on exertion) 10/25/2018   Abscess of right buttock 11/27/2017   Acute sinus infection 11/05/2017   Pilon fracture  03/17/2017   Pilon fracture of left tibia 03/17/2017   At risk for adverse drug reaction 02/28/2017   Closed fracture dislocation of ankle joint, left, with delayed healing, subsequent encounter 02/22/2017   Renal stone 10/26/2016   Gross hematuria 10/26/2016   Bladder neck obstruction 10/26/2016   External hemorrhoid, thrombosed 06/10/2016   Internal hemorrhoids 06/10/2016   Lumbar back pain 05/19/2016   Osteoarthritis of right hip 10/27/2015   Encounter for well adult exam with abnormal findings 10/27/2015   Chronic chest pain 09/22/2015   Acute gouty arthritis 06/11/2015   Coronary artery disease involving native coronary artery with angina pectoris ([place]) 06/11/2015   Chronic diastolic CHF (congestive heart failure) ([place]) 06/11/2015   H/O tissue AVR Oct 2016 06/10/2015   PAF post CABG/AVR- Amiodarone 06/10/2015   Dyslipidemia 06/10/2015   S/P CABG x 4-Oct 2016 03/30/2015   H/O NSTEMI-Oct 2016 03/27/2015   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 ([place])    Depression 09/08/2014  GERD (gastroesophageal reflux disease)    Essential tremor 03/31/2014   Iliotibial band syndrome affecting left lower leg 01/01/2014   Spondylolisthesis at L4-L5 level 01/01/2014   Sinus bradycardia 12/31/2013   Greater trochanteric bursitis of right hip 12/04/2013   OSA on CPAP    Diet-controlled diabetes mellitus ([place]) 07/08/2009   COLONIC POLYPS, HX OF 12/31/2008   GOUT 12/22/2008   Essential hypertension 12/22/2008   Diverticulosis of colon 12/22/2008   NEPHROLITHIASIS, HX OF 12/22/2008      Laureen Abrahams, PT, DPT 12/10/20 12:52 PM    [facility] [address] [place], [place], [address] Phone: [phone]   Fax:  [phone]  Name: Troy Banks MRN: [id number] Date of Birth: [birth year]

## 2020-12-14 ENCOUNTER — Ambulatory Visit: Payer: Medicare Other | Admitting: Physical Medicine and Rehabilitation

## 2020-12-14 ENCOUNTER — Other Ambulatory Visit: Payer: Self-pay

## 2020-12-14 ENCOUNTER — Ambulatory Visit: Payer: Self-pay

## 2020-12-14 ENCOUNTER — Encounter: Payer: Self-pay | Admitting: Physical Medicine and Rehabilitation

## 2020-12-14 VITALS — BP 135/92 | HR 66

## 2020-12-14 DIAGNOSIS — M5416 Radiculopathy, lumbar region: Secondary | ICD-10-CM | POA: Diagnosis not present

## 2020-12-14 MED ORDER — METHYLPREDNISOLONE ACETATE 80 MG/ML IJ SUSP
80.0000 mg | Freq: Once | INTRAMUSCULAR | Status: AC
  Administered 2020-12-14: 80 mg

## 2020-12-14 NOTE — Patient Instructions (Signed)

## 2020-12-14 NOTE — Progress Notes (Signed)
Troy Banks - 80 y.o. male MRN [id number]  Date of birth: 09-24-1940  Office Visit Note: Visit Date: 12/14/2020 PCP: [name], MD Referred by: [name], MD  Subjective: Chief Complaint  Patient presents with   Lower Back - Pain   Left Hip - Pain   Left Thigh - Pain   HPI:  Troy Banks is a 80 y.o. male who comes in today at the request of Dr. Jean Rosenthal for planned Left L4-L5 Lumbar Transforaminal epidural steroid injection with fluoroscopic guidance.  The patient has failed conservative care including home exercise, medications, time and activity modification.  This injection will be diagnostic and hopefully therapeutic.  Please see requesting physician notes for further details and justification. MRI reviewed with images and spine model.  MRI reviewed in the note below.  He has classic dermatomal pattern pain on the left in an L4 distribution to the medial side of the leg and ankle.  He has a burning sensation at the ankle.  Dysesthesias in the same pattern.  Listhesis of L4 on L5.     ROS Otherwise per HPI.  Assessment & Plan: Visit Diagnoses:    ICD-10-CM   1. Lumbar radiculopathy  M54.16 XR C-ARM NO REPORT    Epidural Steroid injection    methylPREDNISolone acetate (DEPO-MEDROL) injection 80 mg      Plan: No additional findings.   Meds & Orders:  Meds ordered this encounter  Medications   methylPREDNISolone acetate (DEPO-MEDROL) injection 80 mg    Orders Placed This Encounter  Procedures   XR C-ARM NO REPORT   Epidural Steroid injection    Follow-up: Return if symptoms worsen or fail to improve.   Procedures: No procedures performed  Lumbosacral Transforaminal Epidural Steroid Injection - Sub-Pedicular Approach with Fluoroscopic Guidance  Patient: Troy Banks      Date of Birth: 1941/05/03 MRN: [id number] PCP: [name], MD      Visit Date: 12/14/2020   Universal Protocol:    Date/Time: 12/14/2020  Consent Given  By: the patient  Position: PRONE  Additional Comments: Vital signs were monitored before and after the procedure. Patient was prepped and draped in the usual sterile fashion. The correct patient, procedure, and site was verified.   Injection Procedure Details:   Procedure diagnoses: Lumbar radiculopathy [M54.16]    Meds Administered:  Meds ordered this encounter  Medications   methylPREDNISolone acetate (DEPO-MEDROL) injection 80 mg    Laterality: Left  Location/Site:  L4-L5  Needle:5.0 in., 22 ga.  Short bevel or Quincke spinal needle  Needle Placement: Transforaminal  Findings:    -Comments: Excellent flow of contrast along the nerve, nerve root and into the epidural space.  Procedure Details: After squaring off the end-plates to get a true AP view, the C-arm was positioned so that an oblique view of the foramen as noted above was visualized. The target area is just inferior to the "nose of the scotty dog" or sub pedicular. The soft tissues overlying this structure were infiltrated with 2-3 ml. of 1% Lidocaine without Epinephrine.  The spinal needle was inserted toward the target using a "trajectory" view along the fluoroscope beam.  Under AP and lateral visualization, the needle was advanced so it did not puncture dura and was located close the 6 O'Clock position of the pedical in AP tracterory. Biplanar projections were used to confirm position. Aspiration was confirmed to be negative for CSF and/or blood. A 1-2 ml. volume of Isovue-250 was injected  and flow of contrast was noted at each level. Radiographs were obtained for documentation purposes.   After attaining the desired flow of contrast documented above, a 0.5 to 1.0 ml test dose of 0.25% Marcaine was injected into each respective transforaminal space.  The patient was observed for 90 seconds post injection.  After no sensory deficits were reported, and normal lower extremity motor function was noted,   the above  injectate was administered so that equal amounts of the injectate were placed at each foramen (level) into the transforaminal epidural space.   Additional Comments:  The patient tolerated the procedure well Dressing: 2 x 2 sterile gauze and Band-Aid    Post-procedure details: Patient was observed during the procedure. Post-procedure instructions were reviewed.  Patient left the clinic in stable condition.    Clinical History: MRI LUMBAR SPINE WITHOUT CONTRAST   TECHNIQUE: Multiplanar, multisequence MR imaging of the lumbar spine was performed. No intravenous contrast was administered.   COMPARISON:  Radiography 11/04/2020. CT 02/23/2017. MRI 11/11/2014.   FINDINGS: Segmentation:  5 lumbar type vertebral bodies.   Alignment:  Chronic degenerative anterolisthesis at L4-5 of 5 mm.   Vertebrae: Old healed minor compression deformities at L2, L4 and L5. These were not present in 2016. There were however visible on the CT of 2018. They are completely healed without residual edema.   Conus medullaris and cauda equina: Conus extends to the L1 level. Conus and cauda equina appear normal.   Paraspinal and other soft tissues: Negative   Disc levels:   T12-L1: Normal   L1-2: Endplate osteophytes and mild bulging of the disc. Mild facet and ligamentous prominence. Mild canal stenosis but without visible focal neural compression.   L2-3: Endplate osteophytes and bulging of the disc. Mild facet and ligamentous hypertrophy. Mild stenosis of both lateral recesses but without visible neural compression. Foraminal stenosis right worse than left. Some potential to affect the exiting L2 nerves, particularly on the right.   L3-4: Mild bulging of the disc. Mild facet and ligamentous hypertrophy. No compressive canal stenosis. Bilateral foraminal stenosis that could possibly affect the exiting L3 nerves.   L4-5: Chronic bilateral facet arthropathy with 5 mm of anterolisthesis. Mild  bulging of the disc. Mild stenosis of the subarticular lateral recesses and proximal foramina with some potential for neural compression at this level.   L5-S1: Endplate osteophytes and bulging of the disc. Mild facet hypertrophy. No compressive stenosis.   IMPRESSION: Old minor compression deformities at L2, L4 and L5, unchanged since 2018.   L4-5: Chronic facet arthropathy with 5 mm of anterolisthesis. Moderate stenosis of the lateral recesses and neural foramina. Definite neural compression is not established but neural irritation could occur.   L2-3: Bilateral lateral recess and foraminal stenosis right more than left. Some potential to affect the exiting L2 nerves, particularly on right.   L1-2: Mild multifactorial canal stenosis but without distinct focal neural compression.   L3-4: Disc bulge. Facet hypertrophy. Mild lateral recess and foraminal stenosis but without distinct neural compression.     Electronically Signed   By: Nelson Chimes M.D.   On: 11/17/2020 07:55     Objective:  VS:  HT:    WT:   BMI:     BP:(!) 135/92  HR:66bpm  TEMP: ( )  RESP:  Physical Exam Vitals and nursing note reviewed.  Constitutional:      General: He is not in acute distress.    Appearance: Normal appearance. He is not ill-appearing.  HENT:  Head: Normocephalic and atraumatic.     Right Ear: External ear normal.     Left Ear: External ear normal.     Nose: No congestion.  Eyes:     Extraocular Movements: Extraocular movements intact.  Cardiovascular:     Rate and Rhythm: Normal rate.     Pulses: Normal pulses.  Pulmonary:     Effort: Pulmonary effort is normal. No respiratory distress.  Abdominal:     General: There is no distension.     Palpations: Abdomen is soft.  Musculoskeletal:        General: No tenderness or signs of injury.     Cervical back: Neck supple.     Right lower leg: No edema.     Left lower leg: No edema.     Comments: Patient has good distal  strength without clonus.  Skin:    Findings: No erythema or rash.  Neurological:     General: No focal deficit present.     Mental Status: He is alert and oriented to person, place, and time.     Sensory: No sensory deficit.     Motor: No weakness or abnormal muscle tone.     Coordination: Coordination normal.  Psychiatric:        Mood and Affect: Mood normal.        Behavior: Behavior normal.     Imaging: XR C-ARM NO REPORT  Result Date: 12/14/2020 Please see Notes tab for imaging impression.

## 2020-12-14 NOTE — Procedures (Signed)
Lumbosacral Transforaminal Epidural Steroid Injection - Sub-Pedicular Approach with Fluoroscopic Guidance  Patient: Troy Banks      Date of Birth: [birth year] MRN: [id number] PCP: [name], MD      Visit Date: 12/14/2020   Universal Protocol:    Date/Time: 12/14/2020  Consent Given By: the patient  Position: PRONE  Additional Comments: Vital signs were monitored before and after the procedure. Patient was prepped and draped in the usual sterile fashion. The correct patient, procedure, and site was verified.   Injection Procedure Details:   Procedure diagnoses: Lumbar radiculopathy [M54.16]    Meds Administered:  Meds ordered this encounter  Medications   methylPREDNISolone acetate (DEPO-MEDROL) injection 80 mg    Laterality: Left  Location/Site:  L4-L5  Needle:5.0 in., 22 ga.  Short bevel or Quincke spinal needle  Needle Placement: Transforaminal  Findings:    -Comments: Excellent flow of contrast along the nerve, nerve root and into the epidural space.  Procedure Details: After squaring off the end-plates to get a true AP view, the C-arm was positioned so that an oblique view of the foramen as noted above was visualized. The target area is just inferior to the "nose of the scotty dog" or sub pedicular. The soft tissues overlying this structure were infiltrated with 2-3 ml. of 1% Lidocaine without Epinephrine.  The spinal needle was inserted toward the target using a "trajectory" view along the fluoroscope beam.  Under AP and lateral visualization, the needle was advanced so it did not puncture dura and was located close the 6 O'Clock position of the pedical in AP tracterory. Biplanar projections were used to confirm position. Aspiration was confirmed to be negative for CSF and/or blood. A 1-2 ml. volume of Isovue-250 was injected and flow of contrast was noted at each level. Radiographs were obtained for documentation purposes.   After attaining the desired  flow of contrast documented above, a 0.5 to 1.0 ml test dose of 0.25% Marcaine was injected into each respective transforaminal space.  The patient was observed for 90 seconds post injection.  After no sensory deficits were reported, and normal lower extremity motor function was noted,   the above injectate was administered so that equal amounts of the injectate were placed at each foramen (level) into the transforaminal epidural space.   Additional Comments:  The patient tolerated the procedure well Dressing: 2 x 2 sterile gauze and Band-Aid    Post-procedure details: Patient was observed during the procedure. Post-procedure instructions were reviewed.  Patient left the clinic in stable condition.

## 2020-12-14 NOTE — Progress Notes (Signed)
Pt state lower back pain that travels to his left hip and to his thigh. Pt state he cant tell what cause the pain but it consent pain. Pt state he don't use anything to help ease the pain.  Numeric Pain Rating Scale and Functional Assessment Average Pain 6   In the last MONTH (on 0-10 scale) has pain interfered with the following?  1. General activity like being  able to carry out your everyday physical activities such as walking, climbing stairs, carrying groceries, or moving a chair?  Rating(10)   +Driver, -BT, +Dye Allergies.

## 2020-12-15 ENCOUNTER — Ambulatory Visit: Payer: Medicare Other | Admitting: Physical Therapy

## 2020-12-15 ENCOUNTER — Encounter: Payer: Self-pay | Admitting: Physical Therapy

## 2020-12-15 ENCOUNTER — Encounter: Payer: Self-pay | Admitting: Gastroenterology

## 2020-12-15 DIAGNOSIS — R29898 Other symptoms and signs involving the musculoskeletal system: Secondary | ICD-10-CM | POA: Diagnosis not present

## 2020-12-15 DIAGNOSIS — M6281 Muscle weakness (generalized): Secondary | ICD-10-CM | POA: Diagnosis not present

## 2020-12-15 DIAGNOSIS — M545 Low back pain, unspecified: Secondary | ICD-10-CM

## 2020-12-15 DIAGNOSIS — G8929 Other chronic pain: Secondary | ICD-10-CM | POA: Diagnosis not present

## 2020-12-15 NOTE — Therapy (Signed)
[facility] Physical Therapy [address] [place], [place], [address] Phone: [phone]   Fax:  [phone]  Physical Therapy Treatment  Patient Details  Name: Troy Banks MRN: [id number] Date of Birth: February 24, 1941 Referring Provider (PT): Mcarthur Rossetti, MD   Encounter Date: 12/15/2020   PT End of Session - 12/15/20 1342     Visit Number 3    Number of Visits 12    Date for PT Re-Evaluation 01/11/21    Authorization Type [employer] Medicare    Progress Note Due on Visit 10    PT Start Time 1300    PT Stop Time 1340    PT Time Calculation (min) 40 min    Activity Tolerance Patient tolerated treatment well    Behavior During Therapy [facility] for tasks assessed/performed             Past Medical History:  Diagnosis Date   Allergy    Anxiety    PTSD   Aortic stenosis, moderate 03/27/2015   post AVR echo Echo 11/16: Mild LVH, EF 55-60%, normal wall motion, grade 1 diastolic dysfunction, bioprosthetic AVR okay (mean gradient 18 mmHg) with trivial AI, mild LAE, atrial septal aneurysm, small effusion    Arthritis    "qwhere" (10/29/2015)   Cancer ([place])    skin cancer   Carotid artery stenosis    Carotid US 11/17: 1-39% bilateral ICA stenosis. F/u prn   Cervical stenosis of spinal canal    fusion 2006   CHF (congestive heart failure) ([place])    "secondry to [facility]"   Closed left pilon fracture    COLONIC POLYPS, HX OF    Complication of anesthesia    "takes a lot to put him under and has woken up during surgery" Difficult to wake up . PTSD; "does not metabolize RX well"that he gets "violent", per pt.    Coronary artery disease    DIVERTICULOSIS, COLON    Dysrhythmia    2 bouts of afib post op and at home but corrected with medication.   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 ([place])    confirmed heterozygous 10/24/14 labs   PTSD (post-traumatic stress disorder)    PTSD (post-traumatic stress disorder)    Sleep apnea    On a CPAP    Past Surgical History:  Procedure Laterality Date   ANKLE FUSION Left 03/17/2017   Procedure: FUSION  PILON FRACTURE WITH BONE GRAFTING;  Surgeon: Shona Needles, MD;  Location: [place];  Service: Orthopedics;  Laterality: Left;   ANTERIOR FUSION CERVICAL SPINE  2002   AORTIC VALVE REPLACEMENT N/A 03/30/2015   Procedure: AORTIC VALVE REPLACEMENT (AVR);  Surgeon: Grace Isaac, MD;  Location: [place];  Service: Open Heart Surgery;  Laterality: N/A;   CARDIAC CATHETERIZATION  N/A 03/26/2015   Procedure: Left Heart Cath and Coronary Angiography;  Surgeon: Leonie Man, MD;  Location: [place] [facility];  Service: Cardiovascular;  Laterality: N/A;   CARDIAC VALVE REPLACEMENT     COLONOSCOPY W/ POLYPECTOMY     CORONARY ARTERY BYPASS GRAFT N/A 03/30/2015   Procedure: CORONARY ARTERY BYPASS GRAFTING (CABG) x four, using left internal mammary artery and left    leg greater saphenous vein harvested endoscopically ;  Surgeon: Grace Isaac, MD;  Location: [place];  Service: Open Heart Surgery;  Laterality: N/A;   CYSTOSCOPY/URETEROSCOPY/HOLMIUM LASER/STENT PLACEMENT Bilateral 11/07/2016   Procedure: CYSTOSCOPY/URETEROSCOPY/HOLMIUM LASER/STENT PLACEMENT;  Surgeon: Nickie Retort, MD;  Location: [facility];  Service: Urology;  Laterality: Bilateral;   CYSTOSCOPY/URETEROSCOPY/HOLMIUM LASER/STENT PLACEMENT Bilateral 11/23/2016   Procedure: CYSTOSCOPY/BILATERAL  URETEROSCOPY/HOLMIUM LASER/STENT EXCHANGE;  Surgeon: Nickie Retort, MD;  Location: [facility];  Service: Urology;  Laterality: Bilateral;  NEEDS 90 MIN    EXTERNAL FIXATION LEG Left 02/22/2017   Procedure: EXTERNAL FIXATION LEFT ANKLE;  Surgeon: Renette Butters, MD;  Location: [place];  Service: Orthopedics;  Laterality: Left;   HEMORRHOID BANDING  1970s   INGUINAL HERNIA REPAIR Left [place]   "cut me open"   LEFT HEART CATH AND CORS/GRAFTS ANGIOGRAPHY N/A 10/25/2018   Procedure: LEFT HEART CATH AND CORS/GRAFTS ANGIOGRAPHY;  Surgeon: Leonie Man, MD;  Location: [place] [facility];  Service: Cardiovascular;  Laterality: N/A;   POSTERIOR FUSION CERVICAL SPINE  2006   TEE WITHOUT CARDIOVERSION N/A 03/30/2015   Procedure: TRANSESOPHAGEAL ECHOCARDIOGRAM (TEE);  Surgeon: Grace Isaac, MD;  Location: [place];  Service: Open Heart Surgery;  Laterality: N/A;   TESTICLE TORSION REDUCTION Right 1960   TOE FUSION Left 1985 & 2004   "great toe" and removal   TONSILLECTOMY     TOTAL HIP ARTHROPLASTY Right 10/27/2015   Procedure: RIGHT TOTAL HIP ARTHROPLASTY ANTERIOR APPROACH and steroid injection right foot;  Surgeon: Mcarthur Rossetti, MD;  Location: [place];  Service: Orthopedics;  Laterality: Right;   TOTAL HIP ARTHROPLASTY Left 08/13/2019   TOTAL HIP ARTHROPLASTY Left 08/13/2019   Procedure: LEFT TOTAL HIP ARTHROPLASTY ANTERIOR APPROACH;  Surgeon: Mcarthur Rossetti, MD;  Location: [place];  Service: Orthopedics;  Laterality: Left;    There were no vitals filed for this visit.   Subjective Assessment - 12/15/20 1303     Subjective had injection yesterday.  hasnt' noticed any change yet.  reports no change with DN after last session as well.    Pertinent History anxiety (PTSD), moderate aortic stenosis, arthritis, carotid artery stenosis, CHF, CAD, GOUT, HTN, MI, bil THA, ACDF    Limitations Sitting;Standing;Walking    How long can you walk comfortably?  1/2-3/4 mile    Diagnostic tests MRI: L4-5: Chronic facet arthropathy with 5 mm of anterolisthesis.  Moderate stenosis of the lateral recesses and neural foramina.  Definite neural compression is not established but neural irritation  could occur.     L2-3: Bilateral lateral recess and foraminal stenosis right more  than left. Some potential to affect the exiting L2 nerves,  particularly on right.     L1-2: Mild multifactorial canal stenosis but without distinct focal  neural compression.     L3-4: Disc bulge. Facet hypertrophy. Mild lateral recess and  foraminal stenosis but without distinct neural compression.    Patient Stated Goals improve pain    Currently in Pain? Yes    Pain Score 4  Pain Location Back    Pain Orientation Left;Lower    Pain Descriptors / Indicators Sore;Dull    Pain Type Chronic pain    Pain Onset More than a month ago    Pain Frequency Constant    Aggravating Factors  walking    Pain Relieving Factors rest                               [facility] Adult PT Treatment/Exercise - 12/15/20 1300       Lumbar Exercises: Stretches   Passive Hamstring Stretch Right;Left;3 reps;30 seconds    Passive Hamstring Stretch Limitations seated    Piriformis Stretch Left;3 reps;30 seconds;Right      Lumbar Exercises: Aerobic   Nustep L7 x 8 min      Lumbar Exercises: Machines for Strengthening   Leg Press 125# 2x10; LLE only 75# 2x10      Lumbar Exercises: Seated   Sit to Stand 20 reps   no UE support   Other Seated Lumbar Exercises seated SLR on Lt 2x10                      PT Short Term Goals - 12/10/20 1249       PT SHORT TERM GOAL #1   Title He will be independent with initial HEP     Time 3    Period Weeks    Status Achieved    Target Date 12/21/20      PT SHORT TERM GOAL #2   Title -      PT SHORT TERM GOAL #3   Title -               PT Long Term Goals - 11/30/20 1419       PT LONG TERM GOAL #1   Title He will be  independent with all HEP issued    Baseline -    Time 6    Period Weeks    Status New    Target Date 01/11/21      PT LONG TERM GOAL #2   Title FOTO score improved to 59 for improved function    Time 6    Period Weeks    Status New    Target Date 01/11/21      PT LONG TERM GOAL #3   Title report pain < 4/10 with walking up to 3/4 mile for improved function    Time 6    Period Weeks    Status New    Target Date 01/11/21      PT LONG TERM GOAL #4   Title demonstrate 4+/5 LLE strength without increase in pain for improved function    Time 6    Period Weeks    Status New    Target Date 01/11/21      PT LONG TERM GOAL #5   Title -                   Plan - 12/15/20 1343     Clinical Impression Statement Pt reporting no change following DN/estim and manual therapy after last visit so session today focused on strengthening and stretching exercises. He had injection yesterday and at this time is feeling no change, but anticipate it may improve over next few weeks.  Will continue to benefit from PT to maximize function.    Personal Factors and Comorbidities Comorbidity 3+  Comorbidities anxiety (PTSD), moderate aortic stenosis, arthritis, carotid artery stenosis, CHF, CAD, GOUT, HTN, MI, bil THA, ACDF, Lt ankle fusion    Examination-Activity Limitations Sit;Sleep;Bend;Squat;Stairs;Stand;Transfers;Locomotion Level    Examination-Participation Restrictions Community Activity;Yard Work;Meal Prep    Stability/Clinical Decision Making Evolving/Moderate complexity    Rehab Potential Good    PT Frequency 2x / week    PT Duration 6 weeks    PT Treatment/Interventions ADLs/Self Care Home Management;Cryotherapy;Electrical Stimulation;Moist Heat;Traction;Therapeutic exercise;Therapeutic activities;Functional mobility training;Stair training;Gait training;Neuromuscular re-education;Patient/family education;Manual techniques;Taping;Dry needling;Passive range of motion;Spinal  Manipulations    PT Next Visit Plan LE strengthening/stretching, manual/modalities PRN; continue to assess for possible DN    PT Home Exercise Plan Access Code: [email or web address]    Consulted and Agree with Plan of Care Patient             Patient will benefit from skilled therapeutic intervention in order to improve the following deficits and impairments:  Hypomobility, Pain, Increased fascial restricitons, Increased muscle spasms, Difficulty walking, Decreased mobility, Decreased strength, Decreased range of motion, Impaired flexibility, Postural dysfunction  Visit Diagnosis: Chronic left-sided low back pain without sciatica  Muscle weakness (generalized)  Other symptoms and signs involving the musculoskeletal system     Problem List Patient Active Problem List   Diagnosis Date Noted   Encounter for fitting and adjustment of hearing aid 11/26/2020   Encounter for fitting and adjustment of spectacles and contact lenses 11/26/2020   DNR (do not resuscitate) discussion 11/26/2020   Aortic atherosclerosis ([place]) 11/26/2020   Allergic rhinitis 10/05/2020   Cough 10/05/2020   Sensorineural hearing loss, bilateral 09/29/2020   Hallux rigidus 09/23/2020   Retinoschisis 09/23/2020   RUQ pain 06/10/2020   History of lacunar cerebrovascular accident 04/05/2020   Headache 03/16/2020   Ankle pain 01/22/2020   Hypothyroidism 11/26/2019   Epididymitis 08/28/2019   Unilateral primary osteoarthritis, left hip 08/13/2019   Status post total replacement of left hip 08/13/2019   Abdominal wall hernia 11/02/2018   Progressive angina ([place]) 10/25/2018   DOE (dyspnea on exertion) 10/25/2018   Abscess of right buttock 11/27/2017   Acute sinus infection 11/05/2017   Pilon fracture 03/17/2017   Pilon fracture of left tibia 03/17/2017   At risk for adverse drug reaction 02/28/2017   Closed fracture dislocation of ankle joint, left, with delayed healing, subsequent encounter 02/22/2017   Renal stone  10/26/2016   Gross hematuria 10/26/2016   Bladder neck obstruction 10/26/2016   External hemorrhoid, thrombosed 06/10/2016   Internal hemorrhoids 06/10/2016   Lumbar back pain 05/19/2016   Osteoarthritis of right hip 10/27/2015   Encounter for well adult exam with abnormal findings 10/27/2015   Chronic chest pain 09/22/2015   Acute gouty arthritis 06/11/2015   Coronary artery disease involving native coronary artery with angina pectoris ([place]) 06/11/2015   Chronic diastolic CHF (congestive heart failure) ([place]) 06/11/2015   H/O tissue AVR Oct 2016 06/10/2015   PAF post CABG/AVR- Amiodarone 06/10/2015   Dyslipidemia 06/10/2015   S/P CABG x 4-Oct 2016 03/30/2015   H/O NSTEMI-Oct 2016 03/27/2015   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 ([place])    Depression 09/08/2014   GERD (gastroesophageal reflux disease)    Essential tremor 03/31/2014   Iliotibial band syndrome affecting left lower leg 01/01/2014   Spondylolisthesis at L4-L5 level 01/01/2014   Sinus bradycardia 12/31/2013   Greater trochanteric bursitis of right hip 12/04/2013   OSA on CPAP    Diet-controlled diabetes mellitus ([place]) 07/08/2009   COLONIC POLYPS, HX OF 12/31/2008  GOUT 12/22/2008   Essential hypertension 12/22/2008   Diverticulosis of colon 12/22/2008   NEPHROLITHIASIS, HX OF 12/22/2008      Laureen Abrahams, PT, DPT 12/15/20 1:45 PM     [facility] [address] [place], [place], [address] Phone: [phone]   Fax:  [phone]  Name: Troy Banks MRN: [id number] Date of Birth: 07-Oct-1940

## 2020-12-17 ENCOUNTER — Telehealth: Payer: Self-pay | Admitting: Podiatry

## 2020-12-17 ENCOUNTER — Encounter: Payer: Self-pay | Admitting: Physical Therapy

## 2020-12-17 ENCOUNTER — Encounter: Payer: Self-pay | Admitting: Podiatry

## 2020-12-17 ENCOUNTER — Other Ambulatory Visit: Payer: Self-pay | Admitting: Internal Medicine

## 2020-12-17 ENCOUNTER — Other Ambulatory Visit: Payer: Self-pay

## 2020-12-17 ENCOUNTER — Ambulatory Visit: Payer: Medicare Other | Admitting: Physical Therapy

## 2020-12-17 VITALS — BP 179/88 | HR 68

## 2020-12-17 DIAGNOSIS — R29898 Other symptoms and signs involving the musculoskeletal system: Secondary | ICD-10-CM

## 2020-12-17 DIAGNOSIS — M6281 Muscle weakness (generalized): Secondary | ICD-10-CM

## 2020-12-17 DIAGNOSIS — G8929 Other chronic pain: Secondary | ICD-10-CM

## 2020-12-17 DIAGNOSIS — M545 Low back pain, unspecified: Secondary | ICD-10-CM

## 2020-12-17 NOTE — Therapy (Signed)
[facility] [address] [place], [place], [address] Phone: [phone]   Fax:  [phone]  Physical Therapy Treatment  Patient Details  Name: Troy Banks MRN: [id number] Date of Birth: Jan 20, 1941 Referring Provider (PT): Mcarthur Rossetti, MD   Encounter Date: 12/17/2020   PT End of Session - 12/17/20 1338     Visit Number 4    Number of Visits 12    Date for PT Re-Evaluation 01/11/21    Authorization Type [employer] Medicare    Progress Note Due on Visit 10    PT Start Time 1300    PT Stop Time 1340    PT Time Calculation (min) 40 min    Activity Tolerance Patient tolerated treatment well    Behavior During Therapy [facility] for tasks assessed/performed             Past Medical History:  Diagnosis Date   Allergy    Anxiety    PTSD   Aortic stenosis, moderate 03/27/2015   post AVR echo Echo 11/16: Mild LVH, EF 55-60%, normal wall motion, grade 1 diastolic dysfunction, bioprosthetic AVR okay (mean gradient 18 mmHg) with trivial AI, mild LAE, atrial septal aneurysm, small effusion    Arthritis    "qwhere" (10/29/2015)   Cancer ([place])    skin cancer   Carotid artery stenosis    Carotid US 11/17: 1-39% bilateral ICA stenosis. F/u prn   Cervical stenosis of spinal canal    fusion 2006   CHF (congestive heart failure) ([place])    "secondry to [facility]"   Closed left pilon fracture    COLONIC POLYPS, HX OF    Complication of anesthesia    "takes a lot to put him under and has woken up during surgery" Difficult to wake up . PTSD; "does not metabolize RX well"that he gets "violent", per pt.    Coronary artery disease    DIVERTICULOSIS, COLON    Dysrhythmia    2 bouts of afib post op and at home but corrected with medication.   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 ([place])    confirmed heterozygous 10/24/14 labs   PTSD (post-traumatic stress disorder)    PTSD (post-traumatic stress disorder)    Sleep apnea    On a CPAP    Past Surgical History:  Procedure Laterality Date   ANKLE FUSION Left 03/17/2017   Procedure: FUSION  PILON FRACTURE WITH BONE GRAFTING;  Surgeon: Shona Needles, MD;  Location: [place];  Service: Orthopedics;  Laterality: Left;   ANTERIOR FUSION CERVICAL SPINE  2002   AORTIC VALVE REPLACEMENT N/A 03/30/2015   Procedure: AORTIC VALVE REPLACEMENT (AVR);  Surgeon: Grace Isaac, MD;  Location: [place];  Service: Open Heart Surgery;  Laterality: N/A;   CARDIAC CATHETERIZATION  N/A 03/26/2015   Procedure: Left Heart Cath and Coronary Angiography;  Surgeon: Leonie Man, MD;  Location: [place] [facility];  Service: Cardiovascular;  Laterality: N/A;   CARDIAC VALVE REPLACEMENT     COLONOSCOPY W/ POLYPECTOMY     CORONARY ARTERY BYPASS GRAFT N/A 03/30/2015   Procedure: CORONARY ARTERY BYPASS GRAFTING (CABG) x four, using left internal mammary artery and left    leg greater saphenous vein harvested endoscopically ;  Surgeon: Grace Isaac, MD;  Location: [place];  Service: Open Heart Surgery;  Laterality: N/A;   CYSTOSCOPY/URETEROSCOPY/HOLMIUM LASER/STENT PLACEMENT Bilateral 11/07/2016   Procedure: CYSTOSCOPY/URETEROSCOPY/HOLMIUM LASER/STENT PLACEMENT;  Surgeon: Nickie Retort, MD;  Location: [facility];  Service: Urology;  Laterality: Bilateral;   CYSTOSCOPY/URETEROSCOPY/HOLMIUM LASER/STENT PLACEMENT Bilateral 11/23/2016   Procedure: CYSTOSCOPY/BILATERAL  URETEROSCOPY/HOLMIUM LASER/STENT EXCHANGE;  Surgeon: Nickie Retort, MD;  Location: [facility];  Service: Urology;  Laterality: Bilateral;  NEEDS 90 MIN    EXTERNAL FIXATION LEG Left 02/22/2017   Procedure: EXTERNAL FIXATION LEFT ANKLE;  Surgeon: Renette Butters, MD;  Location: [place];  Service: Orthopedics;  Laterality: Left;   HEMORRHOID BANDING  1970s   INGUINAL HERNIA REPAIR Left [place]   "cut me open"   LEFT HEART CATH AND CORS/GRAFTS ANGIOGRAPHY N/A 10/25/2018   Procedure: LEFT HEART CATH AND CORS/GRAFTS ANGIOGRAPHY;  Surgeon: Leonie Man, MD;  Location: [place] [facility];  Service: Cardiovascular;  Laterality: N/A;   POSTERIOR FUSION CERVICAL SPINE  2006   TEE WITHOUT CARDIOVERSION N/A 03/30/2015   Procedure: TRANSESOPHAGEAL ECHOCARDIOGRAM (TEE);  Surgeon: Grace Isaac, MD;  Location: [place];  Service: Open Heart Surgery;  Laterality: N/A;   TESTICLE TORSION REDUCTION Right 1960   TOE FUSION Left 1985 & 2004   "great toe" and removal   TONSILLECTOMY     TOTAL HIP ARTHROPLASTY Right 10/27/2015   Procedure: RIGHT TOTAL HIP ARTHROPLASTY ANTERIOR APPROACH and steroid injection right foot;  Surgeon: Mcarthur Rossetti, MD;  Location: [place];  Service: Orthopedics;  Laterality: Right;   TOTAL HIP ARTHROPLASTY Left 08/13/2019   TOTAL HIP ARTHROPLASTY Left 08/13/2019   Procedure: LEFT TOTAL HIP ARTHROPLASTY ANTERIOR APPROACH;  Surgeon: Mcarthur Rossetti, MD;  Location: [place];  Service: Orthopedics;  Laterality: Left;    Vitals:   12/17/20 1305  BP: (!) 179/88  Pulse: 68  SpO2: 97%     Subjective Assessment - 12/17/20 1302     Subjective "not a good day" has a migraine currently, as well as his ankle is a little painful. when asking about injection he said "that was 2 weeks ago." (which was only 3 days prior); c/o 6/10 migraine today    Pertinent History anxiety (PTSD), moderate aortic stenosis, arthritis, carotid artery stenosis, CHF, CAD,  GOUT, HTN, MI, bil THA, ACDF    Limitations Sitting;Standing;Walking    How long can you walk comfortably? 1/2-3/4 mile    Diagnostic tests MRI: L4-5: Chronic facet arthropathy with 5 mm of anterolisthesis.  Moderate stenosis of the lateral recesses and neural foramina.  Definite neural compression is not established but neural irritation  could occur.     L2-3: Bilateral lateral recess and foraminal stenosis right more  than left. Some potential to affect the exiting L2 nerves,  particularly on right.     L1-2: Mild multifactorial canal stenosis but without distinct focal  neural compression.     L3-4: Disc bulge. Facet hypertrophy. Mild lateral recess and  foraminal  stenosis but without distinct neural compression.    Patient Stated Goals improve pain    Pain Score 3     Pain Location Back    Pain Orientation Left;Lower    Pain Onset More than a month ago    Pain Frequency Constant    Aggravating Factors  walking    Pain Relieving Factors rest                               [facility] Adult PT Treatment/Exercise - 12/17/20 1308       Lumbar Exercises: Stretches   Passive Hamstring Stretch Right;Left;3 reps;30 seconds    Passive Hamstring Stretch Limitations seated    Piriformis Stretch Left;3 reps;30 seconds;Right      Lumbar Exercises: Aerobic   Nustep L7 x 2 min - stopped to assess vitals; then x 5 min      Lumbar Exercises: Supine   Pelvic Tilt 10 reps;5 seconds    Clam 10 reps   bil   Bridge 10 reps;5 seconds                      PT Short Term Goals - 12/10/20 1249       PT SHORT TERM GOAL #1   Title He will be independent with initial HEP     Time 3    Period Weeks    Status Achieved    Target Date 12/21/20      PT SHORT TERM GOAL #2   Title -      PT SHORT TERM GOAL #3   Title -               PT Long Term Goals - 12/17/20 1340       PT LONG TERM GOAL #1   Title He will be independent with all HEP issued    Baseline -     Time 6    Period Weeks    Status On-going    Target Date 01/11/21      PT LONG TERM GOAL #2   Title FOTO score improved to 59 for improved function    Time 6    Period Weeks    Status On-going      PT LONG TERM GOAL #3   Title report pain < 4/10 with walking up to 3/4 mile for improved function    Time 6    Period Weeks    Status On-going      PT LONG TERM GOAL #4   Title demonstrate 4+/5 LLE strength without increase in pain for improved function    Time 6    Period Weeks    Status On-going      PT LONG TERM GOAL #5   Title -                   Plan - 12/17/20 1340     Clinical Impression Statement Session limited today due to migraine.  He had a couple episodes of transient confusion today but pt reports this is baseline for him.  Vitals stable per pt's report, but BP elevated today.  He tolerated session well with activites performed.  All goals ongoing at this time.    Personal Factors and Comorbidities Comorbidity 3+    Comorbidities anxiety (PTSD), moderate aortic stenosis, arthritis, carotid artery stenosis, CHF, CAD, GOUT, HTN, MI, bil THA, ACDF, Lt ankle fusion  Examination-Activity Limitations Sit;Sleep;Bend;Squat;Stairs;Stand;Transfers;Locomotion Level    Examination-Participation Restrictions Community Activity;Yard Work;Meal Prep    Stability/Clinical Decision Making Evolving/Moderate complexity    Rehab Potential Good    PT Frequency 2x / week    PT Duration 6 weeks    PT Treatment/Interventions ADLs/Self Care Home Management;Cryotherapy;Electrical Stimulation;Moist Heat;Traction;Therapeutic exercise;Therapeutic activities;Functional mobility training;Stair training;Gait training;Neuromuscular re-education;Patient/family education;Manual techniques;Taping;Dry needling;Passive range of motion;Spinal Manipulations    PT Next Visit Plan LE strengthening/stretching, manual/modalities PRN; continue to assess for possible DN; monitor vitals/symptoms    PT  Home Exercise Plan Access Code: [email or web address]    Consulted and Agree with Plan of Care Patient             Patient will benefit from skilled therapeutic intervention in order to improve the following deficits and impairments:  Hypomobility, Pain, Increased fascial restricitons, Increased muscle spasms, Difficulty walking, Decreased mobility, Decreased strength, Decreased range of motion, Impaired flexibility, Postural dysfunction  Visit Diagnosis: Chronic left-sided low back pain without sciatica  Muscle weakness (generalized)  Other symptoms and signs involving the musculoskeletal system     Problem List Patient Active Problem List   Diagnosis Date Noted   Encounter for fitting and adjustment of hearing aid 11/26/2020   Encounter for fitting and adjustment of spectacles and contact lenses 11/26/2020   DNR (do not resuscitate) discussion 11/26/2020   Aortic atherosclerosis ([place]) 11/26/2020   Allergic rhinitis 10/05/2020   Cough 10/05/2020   Sensorineural hearing loss, bilateral 09/29/2020   Hallux rigidus 09/23/2020   Retinoschisis 09/23/2020   RUQ pain 06/10/2020   History of lacunar cerebrovascular accident 04/05/2020   Headache 03/16/2020   Ankle pain 01/22/2020   Hypothyroidism 11/26/2019   Epididymitis 08/28/2019   Unilateral primary osteoarthritis, left hip 08/13/2019   Status post total replacement of left hip 08/13/2019   Abdominal wall hernia 11/02/2018   Progressive angina ([place]) 10/25/2018   DOE (dyspnea on exertion) 10/25/2018   Abscess of right buttock 11/27/2017   Acute sinus infection 11/05/2017   Pilon fracture 03/17/2017   Pilon fracture of left tibia 03/17/2017   At risk for adverse drug reaction 02/28/2017   Closed fracture dislocation of ankle joint, left, with delayed healing, subsequent encounter 02/22/2017   Renal stone 10/26/2016   Gross hematuria 10/26/2016   Bladder neck obstruction 10/26/2016   External hemorrhoid, thrombosed 06/10/2016    Internal hemorrhoids 06/10/2016   Lumbar back pain 05/19/2016   Osteoarthritis of right hip 10/27/2015   Encounter for well adult exam with abnormal findings 10/27/2015   Chronic chest pain 09/22/2015   Acute gouty arthritis 06/11/2015   Coronary artery disease involving native coronary artery with angina pectoris ([place]) 06/11/2015   Chronic diastolic CHF (congestive heart failure) ([place]) 06/11/2015   H/O tissue AVR Oct 2016 06/10/2015   PAF post CABG/AVR- Amiodarone 06/10/2015   Dyslipidemia 06/10/2015   S/P CABG x 4-Oct 2016 03/30/2015   H/O NSTEMI-Oct 2016 03/27/2015   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 ([place])    Depression 09/08/2014   GERD (gastroesophageal reflux disease)    Essential tremor 03/31/2014   Iliotibial band syndrome affecting left lower leg 01/01/2014   Spondylolisthesis at L4-L5 level 01/01/2014   Sinus bradycardia 12/31/2013   Greater trochanteric bursitis of right hip 12/04/2013   OSA on CPAP    Diet-controlled diabetes mellitus ([place]) 07/08/2009   COLONIC POLYPS, HX OF 12/31/2008   GOUT 12/22/2008   Essential hypertension 12/22/2008   Diverticulosis of colon 12/22/2008   NEPHROLITHIASIS, HX OF 12/22/2008  Laureen Abrahams, PT, DPT 12/17/20 1:46 PM      [facility] [address] [place], [place], [address] Phone: [phone]   Fax:  [phone]  Name: Troy Banks MRN: [id number] Date of Birth: [birth year]

## 2020-12-17 NOTE — Telephone Encounter (Signed)
Called patient lvm to reschedule 8/24 appt and sent reschedule letter  

## 2020-12-17 NOTE — Telephone Encounter (Signed)
Please refill as per office routine med refill policy (all routine meds refilled for 3 mo or monthly per pt preference up to one year from last visit, then month to month grace period for 3 mo, then further med refills will have to be denied)  

## 2020-12-22 ENCOUNTER — Encounter: Payer: Self-pay | Admitting: Physical Therapy

## 2020-12-22 ENCOUNTER — Ambulatory Visit: Payer: Medicare Other | Admitting: Physical Therapy

## 2020-12-22 ENCOUNTER — Other Ambulatory Visit: Payer: Self-pay

## 2020-12-22 DIAGNOSIS — M6281 Muscle weakness (generalized): Secondary | ICD-10-CM | POA: Diagnosis not present

## 2020-12-22 DIAGNOSIS — M545 Low back pain, unspecified: Secondary | ICD-10-CM | POA: Diagnosis not present

## 2020-12-22 DIAGNOSIS — R29898 Other symptoms and signs involving the musculoskeletal system: Secondary | ICD-10-CM | POA: Diagnosis not present

## 2020-12-22 DIAGNOSIS — G8929 Other chronic pain: Secondary | ICD-10-CM

## 2020-12-22 NOTE — Therapy (Addendum)
[facility] Physical Therapy [address] [place], [place], [address] Phone: [phone]   Fax:  [phone]  Physical Therapy Treatment/Discharge Summary  Patient Details  Name: Troy Banks MRN: [id number] Date of Birth: April 15, 1941 Referring Provider (PT): Mcarthur Rossetti, MD   Encounter Date: 12/22/2020   PT End of Session - 12/22/20 1342     Visit Number 5    Number of Visits 12    Date for PT Re-Evaluation 01/11/21    Authorization Type [employer] Medicare    Progress Note Due on Visit 10    PT Start Time 1300    PT Stop Time 6599    PT Time Calculation (min) 38 min    Activity Tolerance Patient tolerated treatment well    Behavior During Therapy [facility] for tasks assessed/performed             Past Medical History:  Diagnosis Date   Allergy    Anxiety    PTSD   Aortic stenosis, moderate 03/27/2015   post AVR echo Echo 11/16: Mild LVH, EF 55-60%, normal wall motion, grade 1 diastolic dysfunction, bioprosthetic AVR okay (mean gradient 18 mmHg) with trivial AI, mild LAE, atrial septal aneurysm, small effusion    Arthritis    "qwhere" (10/29/2015)   Cancer ([place])    skin cancer   Carotid artery stenosis    Carotid US 11/17: 1-39% bilateral ICA stenosis. F/u prn   Cervical stenosis of spinal canal    fusion 2006   CHF (congestive heart failure) ([place])    "secondry to [facility]"   Closed left pilon fracture    COLONIC POLYPS, HX OF    Complication of anesthesia    "takes a lot to put him under and has woken up during surgery" Difficult to wake up . PTSD; "does not metabolize RX well"that he gets "violent", per pt.    Coronary artery disease    DIVERTICULOSIS, COLON    Dysrhythmia    2 bouts of afib post op and at home but corrected with medication.   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 ([place])    confirmed heterozygous 10/24/14 labs   PTSD (post-traumatic stress disorder)    PTSD (post-traumatic stress disorder)    Sleep apnea    On a CPAP    Past Surgical History:  Procedure Laterality Date   ANKLE FUSION Left 03/17/2017   Procedure: FUSION  PILON FRACTURE WITH BONE GRAFTING;  Surgeon: Shona Needles, MD;  Location: [place];  Service: Orthopedics;  Laterality: Left;   ANTERIOR FUSION CERVICAL SPINE  2002   AORTIC VALVE REPLACEMENT N/A 03/30/2015   Procedure: AORTIC VALVE REPLACEMENT (AVR);  Surgeon: Grace Isaac, MD;  Location: [place];  Service: Open Heart Surgery;  Laterality: N/A;   CARDIAC  CATHETERIZATION N/A 03/26/2015   Procedure: Left Heart Cath and Coronary Angiography;  Surgeon: Leonie Man, MD;  Location: [place] [facility];  Service: Cardiovascular;  Laterality: N/A;   CARDIAC VALVE REPLACEMENT     COLONOSCOPY W/ POLYPECTOMY     CORONARY ARTERY BYPASS GRAFT N/A 03/30/2015   Procedure: CORONARY ARTERY BYPASS GRAFTING (CABG) x four, using left internal mammary artery and left    leg greater saphenous vein harvested endoscopically ;  Surgeon: Grace Isaac, MD;  Location: [place];  Service: Open Heart Surgery;  Laterality: N/A;   CYSTOSCOPY/URETEROSCOPY/HOLMIUM LASER/STENT PLACEMENT Bilateral 11/07/2016   Procedure: CYSTOSCOPY/URETEROSCOPY/HOLMIUM LASER/STENT PLACEMENT;  Surgeon: Nickie Retort, MD;  Location: [facility];  Service: Urology;  Laterality: Bilateral;   CYSTOSCOPY/URETEROSCOPY/HOLMIUM LASER/STENT PLACEMENT Bilateral 11/23/2016   Procedure:  CYSTOSCOPY/BILATERAL URETEROSCOPY/HOLMIUM LASER/STENT EXCHANGE;  Surgeon: Nickie Retort, MD;  Location: [facility];  Service: Urology;  Laterality: Bilateral;  NEEDS 90 MIN    EXTERNAL FIXATION LEG Left 02/22/2017   Procedure: EXTERNAL FIXATION LEFT ANKLE;  Surgeon: Renette Butters, MD;  Location: [place];  Service: Orthopedics;  Laterality: Left;   HEMORRHOID BANDING  1970s   INGUINAL HERNIA REPAIR Left [place]   "cut me open"   LEFT HEART CATH AND CORS/GRAFTS ANGIOGRAPHY N/A 10/25/2018   Procedure: LEFT HEART CATH AND CORS/GRAFTS ANGIOGRAPHY;  Surgeon: Leonie Man, MD;  Location: [place] [facility];  Service: Cardiovascular;  Laterality: N/A;   POSTERIOR FUSION CERVICAL SPINE  2006   TEE WITHOUT CARDIOVERSION N/A 03/30/2015   Procedure: TRANSESOPHAGEAL ECHOCARDIOGRAM (TEE);  Surgeon: Grace Isaac, MD;  Location: [place];  Service: Open Heart Surgery;  Laterality: N/A;   TESTICLE TORSION REDUCTION Right 1960   TOE FUSION Left 1985 & 2004   "great toe" and removal   TONSILLECTOMY     TOTAL HIP ARTHROPLASTY Right 10/27/2015   Procedure: RIGHT TOTAL HIP ARTHROPLASTY ANTERIOR APPROACH and steroid injection right foot;  Surgeon: Mcarthur Rossetti, MD;  Location: [place];  Service: Orthopedics;  Laterality: Right;   TOTAL HIP ARTHROPLASTY Left 08/13/2019   TOTAL HIP ARTHROPLASTY Left 08/13/2019   Procedure: LEFT TOTAL HIP ARTHROPLASTY ANTERIOR APPROACH;  Surgeon: Mcarthur Rossetti, MD;  Location: [place];  Service: Orthopedics;  Laterality: Left;    There were no vitals filed for this visit.   Subjective Assessment - 12/22/20 1302     Subjective he's mad at the world, feels his back has gotten worse but leg is improving.  wants to cx remaining appts due to payment mix up    Pertinent History anxiety (PTSD), moderate aortic stenosis, arthritis, carotid artery stenosis, CHF, CAD, GOUT, HTN, MI, bil THA, ACDF    Limitations Sitting;Standing;Walking    How  long can you walk comfortably? 1 mile, pain 3-4/10    Diagnostic tests MRI: L4-5: Chronic facet arthropathy with 5 mm of anterolisthesis.  Moderate stenosis of the lateral recesses and neural foramina.  Definite neural compression is not established but neural irritation  could occur.     L2-3: Bilateral lateral recess and foraminal stenosis right more  than left. Some potential to affect the exiting L2 nerves,  particularly on right.     L1-2: Mild multifactorial canal stenosis but without distinct focal  neural compression.     L3-4: Disc bulge. Facet hypertrophy. Mild lateral recess and  foraminal stenosis but without distinct neural compression.    Patient Stated Goals improve pain    Currently  in Pain? Yes    Pain Score 4     Pain Location Back    Pain Orientation Left;Lower    Pain Descriptors / Indicators Sore;Aching;Dull    Pain Type Chronic pain    Pain Onset More than a month ago    Pain Frequency Constant    Aggravating Factors  walking    Pain Relieving Factors rest                [facility] PT Assessment - 12/22/20 1310       Assessment   Medical Diagnosis M48.07 (ICD-10-CM) - Spinal stenosis of lumbosacral region  M54.42,G89.29 (ICD-10-CM) - Chronic left-sided low back pain with left-sided sciatica    Referring Provider (PT) Mcarthur Rossetti, MD      Observation/Other Assessments   Focus on Therapeutic Outcomes (FOTO)  58      Strength   Overall Strength Comments Lt hip and knee 5/5 with pain with hip flexion, knee ext and hip abduction                           [facility] Adult PT Treatment/Exercise - 12/22/20 1318       Lumbar Exercises: Stretches   Passive Hamstring Stretch Right;Left;3 reps;30 seconds    Passive Hamstring Stretch Limitations seated    Lower Trunk Rotation 5 reps;10 seconds    Lower Trunk Rotation Limitations bil; seated    Piriformis Stretch Left;3 reps;30 seconds;Right                      PT Short Term Goals  - 12/10/20 1249       PT SHORT TERM GOAL #1   Title He will be independent with initial HEP     Time 3    Period Weeks    Status Achieved    Target Date 12/21/20      PT SHORT TERM GOAL #2   Title -      PT SHORT TERM GOAL #3   Title -               PT Long Term Goals - 12/22/20 1342       PT LONG TERM GOAL #1   Title He will be independent with all HEP issued    Baseline -    Time 6    Period Weeks    Status Achieved      PT LONG TERM GOAL #2   Title FOTO score improved to 59 for improved function    Baseline 7/19: improved to 58    Time 6    Period Weeks    Status Partially Met    Target Date 01/11/21      PT LONG TERM GOAL #3   Title report pain < 4/10 with walking up to 3/4 mile for improved function    Time 6    Period Weeks    Status Achieved      PT LONG TERM GOAL #4   Title demonstrate 4+/5 LLE strength without increase in pain for improved function    Baseline 7/19: 5/5 with pain with certain testing positions    Time 6    Period Weeks    Status Partially Met      PT LONG TERM GOAL #5   Title -                   Plan - 12/22/20 1343  Clinical Impression Statement Pt has met/partially met most goals.  His FOTO score is only short 1% compared to predicted change.  He is frustrated with what sounds like a payment mix up and due to this has requested all his visits be canceled.  I encouraged him to review payment/reciepts and notify office of any discrepancies.  Will leave chart open as pt would benefit from additional PT to maximize function and achieve all goals.  If he doesn't return will d/c PT.    Personal Factors and Comorbidities Comorbidity 3+    Comorbidities anxiety (PTSD), moderate aortic stenosis, arthritis, carotid artery stenosis, CHF, CAD, GOUT, HTN, MI, bil THA, ACDF, Lt ankle fusion    Examination-Activity Limitations Sit;Sleep;Bend;Squat;Stairs;Stand;Transfers;Locomotion Level    Examination-Participation Restrictions  Community Activity;Yard Work;Meal Prep    Stability/Clinical Decision Making Evolving/Moderate complexity    Rehab Potential Good    PT Frequency 2x / week    PT Duration 6 weeks    PT Treatment/Interventions ADLs/Self Care Home Management;Cryotherapy;Electrical Stimulation;Moist Heat;Traction;Therapeutic exercise;Therapeutic activities;Functional mobility training;Stair training;Gait training;Neuromuscular re-education;Patient/family education;Manual techniques;Taping;Dry needling;Passive range of motion;Spinal Manipulations    PT Next Visit Plan hold x 30 days, d/c if pt doesn't return.  if he returns continue with core/hip strengthening    PT Home Exercise Plan Access Code: [email or web address]    Consulted and Agree with Plan of Care Patient             Patient will benefit from skilled therapeutic intervention in order to improve the following deficits and impairments:  Hypomobility, Pain, Increased fascial restricitons, Increased muscle spasms, Difficulty walking, Decreased mobility, Decreased strength, Decreased range of motion, Impaired flexibility, Postural dysfunction  Visit Diagnosis: Chronic left-sided low back pain without sciatica  Muscle weakness (generalized)  Other symptoms and signs involving the musculoskeletal system     Problem List Patient Active Problem List   Diagnosis Date Noted   Encounter for fitting and adjustment of hearing aid 11/26/2020   Encounter for fitting and adjustment of spectacles and contact lenses 11/26/2020   DNR (do not resuscitate) discussion 11/26/2020   Aortic atherosclerosis ([place]) 11/26/2020   Allergic rhinitis 10/05/2020   Cough 10/05/2020   Sensorineural hearing loss, bilateral 09/29/2020   Hallux rigidus 09/23/2020   Retinoschisis 09/23/2020   RUQ pain 06/10/2020   History of lacunar cerebrovascular accident 04/05/2020   Headache 03/16/2020   Ankle pain 01/22/2020   Hypothyroidism 11/26/2019   Epididymitis 08/28/2019   Unilateral  primary osteoarthritis, left hip 08/13/2019   Status post total replacement of left hip 08/13/2019   Abdominal wall hernia 11/02/2018   Progressive angina ([place]) 10/25/2018   DOE (dyspnea on exertion) 10/25/2018   Abscess of right buttock 11/27/2017   Acute sinus infection 11/05/2017   Pilon fracture 03/17/2017   Pilon fracture of left tibia 03/17/2017   At risk for adverse drug reaction 02/28/2017   Closed fracture dislocation of ankle joint, left, with delayed healing, subsequent encounter 02/22/2017   Renal stone 10/26/2016   Gross hematuria 10/26/2016   Bladder neck obstruction 10/26/2016   External hemorrhoid, thrombosed 06/10/2016   Internal hemorrhoids 06/10/2016   Lumbar back pain 05/19/2016   Osteoarthritis of right hip 10/27/2015   Encounter for well adult exam with abnormal findings 10/27/2015   Chronic chest pain 09/22/2015   Acute gouty arthritis 06/11/2015   Coronary artery disease involving native coronary artery with angina pectoris ([place]) 06/11/2015   Chronic diastolic CHF (congestive heart failure) ([place]) 06/11/2015   H/O tissue AVR Oct 2016 06/10/2015  PAF post CABG/AVR- Amiodarone 06/10/2015   Dyslipidemia 06/10/2015   S/P CABG x 4-Oct 2016 03/30/2015   H/O NSTEMI-Oct 2016 03/27/2015   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 ([place])    Depression 09/08/2014   GERD (gastroesophageal reflux disease)    Essential tremor 03/31/2014   Iliotibial band syndrome affecting left lower leg 01/01/2014   Spondylolisthesis at L4-L5 level 01/01/2014   Sinus bradycardia 12/31/2013   Greater trochanteric bursitis of right hip 12/04/2013   OSA on CPAP    Diet-controlled diabetes mellitus ([place]) 07/08/2009   COLONIC POLYPS, HX OF 12/31/2008   GOUT 12/22/2008   Essential hypertension 12/22/2008   Diverticulosis of colon 12/22/2008   NEPHROLITHIASIS, HX OF 12/22/2008      Laureen Abrahams, PT, DPT 12/22/20 1:50 PM      [facility]  Physical Therapy [address] [place], [place], [address] Phone: [phone]   Fax:  [phone]  Name: Troy Banks MRN: [id number] Date of Birth: 1941/04/29      PHYSICAL THERAPY DISCHARGE SUMMARY  Visits from Start of Care: 5  Current functional level related to goals / functional outcomes: See above   Remaining deficits: See above   Education / Equipment: HEP   Patient agrees to discharge. Patient goals were partially met. Patient is being discharged due to the patient's request.  Laureen Abrahams, PT, DPT 02/15/21 1:33 PM  [facility] [address] [place], [place], [address] Phone: [phone]   Fax:  [phone]

## 2020-12-24 ENCOUNTER — Encounter: Payer: Medicare Other | Admitting: Physical Therapy

## 2020-12-29 ENCOUNTER — Encounter: Payer: Medicare Other | Admitting: Physical Therapy

## 2020-12-31 ENCOUNTER — Encounter: Payer: Medicare Other | Admitting: Physical Therapy

## 2021-01-08 ENCOUNTER — Ambulatory Visit: Payer: Medicare Other | Admitting: Neurology

## 2021-01-15 ENCOUNTER — Other Ambulatory Visit: Payer: Self-pay

## 2021-01-15 ENCOUNTER — Ambulatory Visit: Payer: Medicare Other | Admitting: Neurology

## 2021-01-15 DIAGNOSIS — G43709 Chronic migraine without aura, not intractable, without status migrainosus: Secondary | ICD-10-CM | POA: Diagnosis not present

## 2021-01-15 MED ORDER — ONABOTULINUMTOXINA 100 UNITS IJ SOLR
200.0000 [IU] | Freq: Once | INTRAMUSCULAR | Status: AC
  Administered 2021-01-15: 155 [IU] via INTRAMUSCULAR

## 2021-01-15 NOTE — Procedures (Signed)
Botulinum Clinic   History:  Diagnosis: chronic migraine   Result History  Not that helpful but decided to proceed one more time to see if efficacy changed/improved  Consent obtained from: The patient Benefits discussed included, but were not limited to decreased muscle tightness, increased joint range of motion, and decreased pain.  Risk discussed included, but were not limited pain and discomfort, bleeding, bruising, excessive weakness, venous thrombosis, muscle atrophy, asymmetry, death.  A copy of the patient medication guide was given to the patient which explains the blackbox warning.  Patients identity and treatment sites confirmed Yes.  .  Details of Procedure: Skin was cleaned with alcohol.  A 30 gauge, 1m  needle was introduced to the target muscle.  Prior to injection, the needle plunger was aspirated to make sure the needle was not within a blood vessel.  There was no blood retrieved on aspiration.    Following is a summary of the muscles injected  And the amount of Botulinum toxin used:  Injections  Location Left  Right Units Number of sites        Corrugator 5.0 5.0 10 1 each side  Frontalis 5.0/5.0 5.0/5.0 20 2 each  procerus   5 1  temporalis 5.0 x 4 5.0 x 4 40 4 each  occipitalis 5.0x 3 5.0x '3 30 3 '$ each  Cervical paraspinal 5.0/5.0 5.0/5.0 20 2 each  trapezius 5.0 x 3 5.0 x '3 30 3 '$ each                          TOTAL UNITS:   155    Agent: Botulinum Type A ( Onobotulinum Toxin type A ).  200 unit vial of Botox used, each containing 50 units and freshly diluted with 2 mL of sterile, non-perserved saline   Total injected (Units):  155  Total wasted (Units):  45 Pt tolerated procedure well without complications.   Reinjection is anticipated in 3 months.

## 2021-01-27 ENCOUNTER — Ambulatory Visit: Payer: Medicare Other | Admitting: Podiatry

## 2021-02-02 ENCOUNTER — Ambulatory Visit: Payer: Medicare Other | Admitting: Podiatry

## 2021-02-02 ENCOUNTER — Other Ambulatory Visit: Payer: Self-pay

## 2021-02-02 ENCOUNTER — Encounter: Payer: Self-pay | Admitting: Podiatry

## 2021-02-02 DIAGNOSIS — E119 Type 2 diabetes mellitus without complications: Secondary | ICD-10-CM | POA: Diagnosis not present

## 2021-02-02 DIAGNOSIS — M79674 Pain in right toe(s): Secondary | ICD-10-CM

## 2021-02-02 DIAGNOSIS — B351 Tinea unguium: Secondary | ICD-10-CM | POA: Diagnosis not present

## 2021-02-02 DIAGNOSIS — M79675 Pain in left toe(s): Secondary | ICD-10-CM

## 2021-02-02 NOTE — Progress Notes (Signed)
This patient returns to the office for evaluation and treatment of long thick painful nails .  This patient is unable to trim his own nails since the patient cannot reach the feet.  Patient says the nails are painful walking and wearing his shoes.  He returns for preventive foot care services.    General Appearance  Alert, conversant and in no acute stress.  Vascular  Dorsalis pedis and posterior tibial  pulses are palpable  bilaterally.  Capillary return is within normal limits  bilaterally. Temperature is within normal limits  bilaterally.  Neurologic  Senn-Weinstein monofilament wire test within normal limits  bilaterally. Muscle power within normal limits bilaterally.  Nails Thick disfigured discolored nails with subungual debris  from hallux to fifth toes bilaterally. No evidence of bacterial infection or drainage bilaterally.  Orthopedic  No limitations of motion  feet .  No crepitus or effusions noted.  No bony pathology or digital deformities noted.  Ankle/STJ immobile left foot/ankle.  Fused 1st MPJ ight foot.  Skin  normotropic skin with no porokeratosis noted bilaterally.  No signs of infections or ulcers noted.     Onychomycosis  Pain in toes right foot  Pain in toes left foot  Debridement  of nails  1-5  B/L with a nail nipper.  Nails were then filed using a dremel tool with no incidents.    RTC  4 month   Gardiner Barefoot DPM

## 2021-02-18 ENCOUNTER — Telehealth: Payer: Self-pay

## 2021-02-18 ENCOUNTER — Other Ambulatory Visit: Payer: Self-pay | Admitting: Cardiology

## 2021-02-18 NOTE — Telephone Encounter (Signed)
Sent 02/18/21

## 2021-02-18 NOTE — Telephone Encounter (Signed)
New message   Upcoming Botox appt on 04/16/2021  Fax request from [employer] Notice of expiring prior authorization  Botox  injection  200 unit is set to expire soon.   Plan member ID [id number]   [place]: [place] Case ID: [id number] Need help? Call us at [phone]  Status Sent to Plan today  Drug Botox 200UNIT solution  Form [employer] Medicare Part D Electronic Prior Authorization Form (2017 NCPDP)

## 2021-02-18 NOTE — Telephone Encounter (Signed)
F/u   Outcome Approved today  Request Reference Number: [id number]. BOTOX INJ 200UNIT is approved through 05/20/2021. Your patient may now fill this prescription and it will be covered.

## 2021-02-26 ENCOUNTER — Telehealth: Payer: Self-pay | Admitting: Neurology

## 2021-02-26 ENCOUNTER — Telehealth: Payer: Self-pay | Admitting: *Deleted

## 2021-02-26 NOTE — Telephone Encounter (Signed)
-----   Message from [place], DO sent at 01/14/2021  2:13 PM EDT ----- Call patient and see how headaches doing.  If not well, does he want to continue or cancel next botox?

## 2021-02-26 NOTE — Telephone Encounter (Signed)
Called pt- no answer- LM to return call I canceled [facility] colon and 10-11 PV   Marie PV

## 2021-02-26 NOTE — Telephone Encounter (Signed)
Called both numbers unable to leave voicemail on both lines voicemail not set up

## 2021-02-26 NOTE — Telephone Encounter (Signed)
Dr Loletha Carrow,  This 80 year old pt is scheduled for a colon for a hx of colon polyps-  last colon 2019 in the [place] -  he has a complicated medical hx-   he had a difficult intubation as below-  Does he need an OV prior to a colon?  R. Ola Spurr, MD Procedure Name: Intubation Date/Time: 03/30/2015 8:48 AM Performed by: Kerby Less Pre-anesthesia Checklist: Patient identified, Emergency Drugs available, Suction available, Patient being monitored and Timeout performed Patient Re-evaluated:Patient Re-evaluated prior to inductionOxygen Delivery Method: Circle system utilized Preoxygenation: Pre-oxygenation with 100% oxygen Intubation Type: IV induction and Cricoid Pressure applied Ventilation: Mask ventilation with difficulty and Oral airway inserted - appropriate to patient size Grade View: Grade IV Tube type: Oral Tube size: 8.0 mm Number of attempts: 2 Airway Equipment and Method: Stylet and Video-laryngoscopy Placement Confirmation: ETT inserted through vocal cords under direct vision,  positive ETCO2 and breath sounds checked- equal and bilateral Secured at: 25 cm Tube secured with: Tape Dental Injury: Teeth and Oropharynx as per pre-operative assessment  Difficulty Due To: Difficulty was anticipated, Difficult Airway- due to reduced neck mobility and Difficult Airway- due to large tongue Future Recommendations: Recommend- induction with short-acting agent, and alternative techniques readily available Comments: First DL c MAC 4 no airway structures  Visualized. Mask ventilated c opa good visualization c glide scope ett inserted s probs

## 2021-02-26 NOTE — Telephone Encounter (Signed)
Pt is returning a missed call, doesn't know who from.

## 2021-02-26 NOTE — Telephone Encounter (Signed)
Thank you for the chart review and the note.  Although this patient had two colonoscopies without difficulty in the [facility] after the 2016 surgical record you indicated, our anesthesia staff would now consider the patient a potentially difficult intubation and not allow the procedure to be done in the [place].  Please cancel upcoming [place] visits and arrange an office visit with me.  - HD

## 2021-02-26 NOTE — Telephone Encounter (Signed)
Patients wife called me back . She said there is not a huge change in the headaches but he has noticed some change for the better. They would like to continue with another dose of Botox to see if it would help to try another dose. Patient has also had a marked increase in tremors in the right side and would like to know if Dr. Carles Collet would like to put him on Primidone again? I let patients wife know Dr. Carles Collet will be out of the office until Monday

## 2021-03-01 ENCOUNTER — Other Ambulatory Visit: Payer: Self-pay

## 2021-03-01 DIAGNOSIS — G43709 Chronic migraine without aura, not intractable, without status migrainosus: Secondary | ICD-10-CM

## 2021-03-01 DIAGNOSIS — G250 Essential tremor: Secondary | ICD-10-CM

## 2021-03-01 MED ORDER — PRIMIDONE 50 MG PO TABS: 50.0000 mg | ORAL_TABLET | Freq: Once | ORAL | 0 refills | Status: DC

## 2021-03-01 NOTE — Telephone Encounter (Signed)
11-1 Tuesday at 220 pm OV with Dr Loletha Carrow-  wife states she was aware of pt's difficult airway   Lelan Pons PV

## 2021-03-01 NOTE — Telephone Encounter (Signed)
Called patients wife and let them know Dr. Arturo Morton recommendation for Primidone 50mg  1 time a day. Patients wife agreed and I sent in the prescription and added to med list

## 2021-03-04 ENCOUNTER — Other Ambulatory Visit: Payer: Self-pay

## 2021-03-04 DIAGNOSIS — G43709 Chronic migraine without aura, not intractable, without status migrainosus: Secondary | ICD-10-CM

## 2021-03-04 DIAGNOSIS — G250 Essential tremor: Secondary | ICD-10-CM

## 2021-03-04 MED ORDER — PRIMIDONE 50 MG PO TABS: 50.0000 mg | ORAL_TABLET | Freq: Once | ORAL | 1 refills | Status: DC

## 2021-03-14 NOTE — Progress Notes (Signed)
03/17/2021 JUVENCIO VERDI   1940/11/26  [id number]  Primary Physician [name], MD Primary Cardiologist: Dr Antonios Ostrow [name]  HPI:  80 y.o. Male seen for follow up CAD. He was admitted 10/19-10/31/16 with a non-STEMI. LHC demonstrated 3 vessel CAD. He also had significant aortic stenosis.  He underwent CABG with LIMA-LAD, SVG-intermediate, SVG-LCx, SVG-distal RCA and bioprosthetic AVR.  Atenolol was initiated but later stopped secondary to bradycardia. He did develop atrial fibrillation. He was placed on amiodarone with return to NSR.  He has an essential tremor and is on Primidone chronically. 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  Amiodarone was stopped in January 2017 at the time he was hospitalized for an acute gout attack in his [place]. He has maintained NSR.  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 He has a daughter who is a family physician in [place].  In May 2018 he complained of more dyspnea on exertion and some chest tightness.  CXR was clear. Myoview study was normal.  In June 2018 he underwent cystoscopy and lithotripsy for renal stones.   In September 2018 he fell off his roof fracturing his left ankle and had a compression fracture of L2. He required extensive surgery of his ankle.   In May 2020 he presented with symptoms of increased DOE. He underwent cardiac cath showing all grafts were patent. Echo showed normal EF with evidence of diastolic dysfunction. The AV prothesis function had not changed. He had mild pulmonary HTN. His diuretic dose was increased.  He did undergo left THR in March. Carotid dopplers in July looked good.   On follow up today he is doing well. He does have white coat syndrome. Usually BP at home is 140/70-80 His wife confirms this. His breathing is doing well. He denies any chest pain or palpitations. He did have cranial [place] treatment at [facility] for tremor and this has helped. States  his physician at the [place] wanted to put him on losartan but he refused. (Note history of hyperkalemia on lisinopril in past)   Current Outpatient Medications  Medication Sig Dispense Refill   acetaminophen (TYLENOL) 500 MG tablet TAKE TWO TABLETS BY MOUTH EVERY 6 HOURS AS NEEDED     allopurinol (ZYLOPRIM) 300 MG tablet TAKE 1 TABLET BY MOUTH EVERY DAY 90 tablet 0   amLODipine (NORVASC) 10 MG tablet TAKE 1 TABLET BY MOUTH EVERY DAY 90 tablet 1   Ascorbic Acid (VITAMIN C) 1000 MG tablet Take 1,000 mg by mouth 2 (two) times daily.      aspirin EC 81 MG tablet Take 81 mg by mouth daily. Swallow whole.     benzonatate (TESSALON PERLES) 100 MG capsule Take 1 capsule (100 mg total) by mouth 3 (three) times daily as needed for cough. 60 capsule 0   BOTOX 200 units SOLR      calcium carbonate (TUMS EX) 750 MG chewable tablet Chew 1 tablet by mouth daily as needed for heartburn.      Carboxymethylcellulose Sodium (LUBRICANT EYE DROPS OP) Place 1 drop into both eyes 2 (two) times daily as needed (dry/irritated eyes.).      Carboxymethylcellulose Sodium 0.25 % SOLN INSTILL 1 DROP IN EACH EYE AS DIRECTED 2 TO 4 TIMES A DAY     Cholecalciferol (VITAMIN D-3) 125 MCG (5000 UT) TABS Take 5,000 Units by mouth 2 (two) times a day.     famotidine (PEPCID) 40 MG tablet  TAKE 1 TABLET BY MOUTH EVERY DAY 90 tablet 2   furosemide (LASIX) 40 MG tablet TAKE 1 TABLET BY MOUTH EVERY OTHER DAY 45 tablet 2   levothyroxine (SYNTHROID) 25 MCG tablet TAKE 1 TABLET BY MOUTH EVERY DAY WITH BREAKFAST 90 tablet 3   nitroGLYCERIN (NITROSTAT) 0.4 MG SL tablet PLACE 1 TABLET UNDER THE TONGUE EVERY 5 MINUTES AS NEEDED FOR CHEST PAIN. (Patient taking differently: Place 0.4 mg under the tongue every 5 (five) minutes as needed for chest pain.) 25 tablet 3   rosuvastatin (CRESTOR) 10 MG tablet TAKE 1/2 TABLET BY MOUTH EVERY DAY 45 tablet 3   tiZANidine (ZANAFLEX) 4 MG tablet Take 1 tablet by mouth every 8 (eight) hours as needed.      Turmeric 500 MG CAPS Take 500 mg by mouth daily.      vitamin B-12 (CYANOCOBALAMIN) 1000 MCG tablet Take 1,000 mcg by mouth daily.      polyethylene glycol powder (GLYCOLAX/MIRALAX) 17 GM/SCOOP powder TAKE 17 GRAMS BY MOUTH DAILY (Patient not taking: Reported on 03/17/2021)     polyethylene glycol powder (GLYCOLAX/MIRALAX) 17 GM/SCOOP powder Take by mouth. (Patient not taking: Reported on 03/17/2021)     primidone (MYSOLINE) 50 MG tablet Take 1 tablet (50 mg total) by mouth once for 1 dose. 90 tablet 1   Current Facility-Administered Medications  Medication Dose Route Frequency Provider Last Rate Last Admin   sodium chloride flush (NS) 0.9 % injection 3 mL  3 mL Intravenous Q12H Almyra Deforest, [place]        Allergies  Allergen Reactions   Acyclovir And Related Other (See Comments)    Accumulates and causes stroke-like side-effects due to cytochrome P450 enzyme deficiency   Benzodiazepines Other (See Comments)    Patient states he gets stroke symptoms with benzo's.    Compazine [Prochlorperazine Maleate] Other (See Comments)    Accumulates and causes stroke-like side-effects due to cytochrome P450 enzyme deficiency   Coreg [Carvedilol] Other (See Comments)    Accumulates and causes stroke-like side-effects due to cytochrome P450 enzyme deficiency Patient poor metabolizer of CYP2D6 - Coreg undergoes extensive hepatic (including 2D6)   Flomax [Tamsulosin Hcl] Other (See Comments)    Accumulates and causes stroke-like side-effects due to cytochrome P450 enzyme deficiency   Lisinopril Other (See Comments)    Hyperkalemia. 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   Mobic [Meloxicam] Other (See Comments)    Accumulates and causes stroke-like side-effects due to cytochrome P450 enzyme deficiency   Ondansetron Other (See Comments)    Accumulates and causes stroke-like side-effects due to cytochrome P450 enzyme deficiency    Oxycodone Other (See  Comments)    Accumulates and causes stroke-like side-effects due to cytochrome P450 enzyme deficiency   Promethazine Other (See Comments)    Accumulates and causes stroke-like side-effects due to cytochrome P450 enzyme deficiency   Sertraline Hcl Other (See Comments)    Accumulates and causes stroke-like side-effects due to cytochrome P450 enzyme deficiency   Tramadol Other (See Comments)    Accumulates and causes stroke-like side-effects due to cytochrome P450 enzyme deficiency   Atorvastatin Other (See Comments)    MYALGIAS   Colchicine Other (See Comments)    ataxia   Fentanyl Other (See Comments)    Accumulates and cause stroke like symptoms    Social History   Socioeconomic History   Marital status: Married    Spouse name: Karmen Bongo, RN   Number of children: 2   Years of education:  Not on file   Highest education level: Some college, no degree  Occupational History   Occupation: Retired Animal nutritionist  Tobacco Use   Smoking status: Former    Packs/day: 1.00    Years: 34.00    Pack years: 34.00    Types: Cigarettes    Quit date: 06/06/1990    Years since quitting: 30.8   Smokeless tobacco: Never  Vaping Use   Vaping Use: Never used  Substance and Sexual Activity   Alcohol use: Yes    Alcohol/week: 3.0 standard drinks    Types: 1 Cans of beer, 2 Shots of liquor per week    Comment: occassional   Drug use: No   Sexual activity: Yes  Other Topics Concern   Not on file  Social History Narrative   Left Handed    One Story home    Social Determinants of Health   Financial Resource Strain: Low Risk    Difficulty of Paying Living Expenses: Not hard at all  Food Insecurity: No Food Insecurity   Worried About Charity fundraiser in the Last Year: Never true   Ran Out of Food in the Last Year: Never true  Transportation Needs: No Transportation Needs   Lack of Transportation (Medical): No   Lack of Transportation (Non-Medical): No  Physical Activity:  Sufficiently Active   Days of Exercise per Week: 5 days   Minutes of Exercise per Session: 30 min  Stress: No Stress Concern Present   Feeling of Stress : Not at all  Social Connections: Socially Integrated   Frequency of Communication with Friends and Family: More than three times a week   Frequency of Social Gatherings with Friends and Family: Once a week   Attends Religious Services: More than 4 times per year   Active Member of [employer] or Organizations: Yes   Attends Music therapist: More than 4 times per year   Marital Status: Married  Human resources officer Violence: Not on file     Review of Systems: As noted in HPI All other systems reviewed and are otherwise negative except as noted above.    Blood pressure (!) 160/80, pulse (!) 57, height 5\' 8"  (1.727 m), weight 216 lb 12.8 oz (98.3 kg), SpO2 98 %.  GENERAL:  Well appearing WM in NAD HEENT:  PERRL, EOMI, sclera are clear. Oropharynx is clear. NECK:  No jugular venous distention, carotid upstroke brisk and symmetric, no bruits, no thyromegaly or adenopathy LUNGS:  Clear to auscultation bilaterally CHEST:  Unremarkable, old sternotomy scar HEART:  RRR,  PMI not displaced or sustained,S1 and S2 within normal limits, no S3, no S4: no clicks, no rubs, gr 2/6 systolic murmur RUSB.  ABD:  Soft, nontender. BS +, no masses or bruits. No hepatomegaly, no splenomegaly EXT:  2 + pulses throughout, Tr edema on left- left calf is larger than right., no cyanosis no clubbing SKIN:  Warm and dry.  No rashes NEURO:  Alert and oriented x 3. Cranial nerves II through XII intact. PSYCH:  Cognitively intact    Lab Results  Component Value Date   WBC 7.5 06/10/2020   HGB 15.1 06/10/2020   HCT 44.9 06/10/2020   PLT 148.0 (L) 06/10/2020   GLUCOSE 91 06/10/2020   CHOL 126 06/10/2020   TRIG 156.0 (H) 06/10/2020   HDL 40.20 06/10/2020   LDLDIRECT 63.0 12/04/2019   LDLCALC 55 06/10/2020   ALT 22 06/10/2020   AST 16 06/10/2020    NA 140  06/10/2020   K 3.9 06/10/2020   CL 103 06/10/2020   CREATININE 1.01 06/10/2020   BUN 19 06/10/2020   CO2 31 06/10/2020   TSH 3.19 11/26/2020   PSA 1.49 06/10/2020   INR 1.47 03/30/2015   HGBA1C 6.0 06/10/2020   MICROALBUR 8.8 (H) 06/10/2020   Ecg today shows NSR rate 57. Normal . I have personally reviewed and interpreted this study.   Echo 04/24/15: Study Conclusions   - Left ventricle: The cavity size was normal. Wall thickness was   increased in a pattern of mild LVH. Systolic function was normal.   The estimated ejection fraction was in the range of 55% to 60%.   Wall motion was normal; there were no regional wall motion   abnormalities. Doppler parameters are consistent with abnormal   left ventricular relaxation (grade 1 diastolic dysfunction). - Aortic valve: A bioprosthesis was present. There was trivial   regurgitation. - Left atrium: The atrium was mildly dilated. - Atrial septum: There was an atrial septal aneurysm. - Pericardium, extracardiac: A small pericardial effusion was   identified.   Impressions:   - Normal LV systolic function; grade 1 diastolic dysfunction; mild   LVH; s/p AVR with trace AI and mean gradient of 18 mmHg; mild   LAE; trace MR.  Myoview 10/22/16: Study Highlights     The left ventricular ejection fraction is mildly decreased (45-54%). Nuclear stress EF: 53%. No T wave inversion was noted during stress. There was no ST segment deviation noted during stress. This is a low risk study.   No reversible ischemia. LVEF 53% with normal wall motion. This is a low risk study.     Cardiac cath: 10/25/18:  LEFT HEART CATH AND CORS/GRAFTS ANGIOGRAPHY  Conclusion    Ost LAD to Prox LAD lesion is 80% stenosed. Mid LAD lesion is 100% stenosed -after second septal branch which coursed is a tandem LAD LIMA-dLAD graft was visualized by angiography and is normal in caliber. The graft exhibits no disease. Ost Ramus-1 lesion is 90% stenosed.  Ost Ramus-2 lesion is 60% stenosed (shortly after lateral branch). SVG-Ramus graft was visualized by angiography and is large. The graft exhibits minimal luminal irregularities. Ost Cx lesion is 80% stenosed. Prox Cx to Mid Cx lesion is 80% stenosed. Mid Cx lesion is 60% stenosed. SVG-dCx graft was visualized by angiography and is large. The graft exhibits minimal luminal irregularities. Retrograde filling of OM 2 and AV groove circumflex Prox RCA to Mid RCA lesion is 100% stenosed. This is progression from 99% stenosis pre-CABG. SVG-dRCA graft was visualized by angiography and is large. The graft exhibits no disease. Unable to cross aortic valve prosthesis.   SUMMARY Severe native CAD essentially unchanged from post CABG with exception of now 100% occluded RCA. 4 out of 4 patent grafts: LIMA-LAD, SVG-RI (D1), SVG-dCx, SVG-D RCA Systemic Hypertension Angiographically small aortic root/stent prosthetic valve.  Unable to cross.     RECOMMENDATIONS Discharge home after bedrest and hydration Consider 2D echocardiogram to assess for restenosis of aortic valve Pending echocardiographic evaluation, would consider non-anginal etiology -> with hypertension, could be related to diastolic dysfunction.     Glenetta Hew, MD   Echo 11/20/18:IMPRESSIONS      1. A 2mm an Edwards porcine bioprosthesis valve is present in the aortic position. Procedure Date: 03/30/15 Echo findings shows no evidence of regurgitation, perivalvular leak, rocking or dehiscence of the aortic prosthesis. Bioprosthetic leaflets not  well seen. Mean systolic gradient is 19 mmHg, no significant difference  from gradient reported 1 month post operative in 04/2015.  2. The left ventricle has normal systolic function with an ejection fraction of 60-65%. The cavity size was normal. There is mildly increased left ventricular wall thickness. Left ventricular diastolic Doppler parameters are consistent with impaired  relaxation due to  nondiagnostic images. Elevated left ventricular end-diastolic pressure The E/e' is 20.  3. The right ventricle has normal systolic function. The cavity was normal. There is no increase in right ventricular wall thickness. Right ventricular systolic pressure is mildly elevated with an estimated pressure of 35.9 mmHg.  4. Left atrial size was mild-moderately dilated.  5. There is mild mitral annular calcification present.  6. There is mild dilatation of the ascending aorta measuring 40 mm.    ASSESSMENT AND PLAN:  1.  CAD s/p CABG in October 2016 following NSTEMI.   Myoview negative in May 2018. Cardiac cath in May 2020 showed all grafts were patent. No significant angina. Continue statin. Not on beta blocker due to bradycardia.   2. S/p bioprosthetic AVR for aortic stenosis. On routine SBE prophylaxis.  Echo June 2020 unchanged from initial post op Echo.  3.  Hyperlipidemia. Satisfactory on Crestor 5 mg daily. Plan repeat lab work with Dr Jenny Reichmann in January.  4. HTN. With white coat syndrome. BP at home acceptable. Continue same meds. Not a candidate for beta blocker due to bradycardia, would avoid ACEi, ARB, aldactone with elevated potassium. If needed would consider Cardura but BP has been stable at home.   5. 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   Follow up in 6 months      Tamrah Victorino [name] MD,FACC 03/17/2021 10:19 AM

## 2021-03-15 ENCOUNTER — Other Ambulatory Visit: Payer: Self-pay | Admitting: Internal Medicine

## 2021-03-15 NOTE — Telephone Encounter (Signed)
Please refill as per office routine med refill policy (all routine meds to be refilled for 3 mo or monthly (per pt preference) up to one year from last visit, then month to month grace period for 3 mo, then further med refills will have to be denied) ? ?

## 2021-03-17 ENCOUNTER — Ambulatory Visit: Payer: Medicare Other | Admitting: Cardiology

## 2021-03-17 ENCOUNTER — Encounter: Payer: Self-pay | Admitting: Cardiology

## 2021-03-17 ENCOUNTER — Other Ambulatory Visit: Payer: Self-pay

## 2021-03-17 VITALS — BP 160/80 | HR 57 | Ht 68.0 in | Wt 216.8 lb

## 2021-03-17 DIAGNOSIS — Z952 Presence of prosthetic heart valve: Secondary | ICD-10-CM

## 2021-03-17 DIAGNOSIS — I10 Essential (primary) hypertension: Secondary | ICD-10-CM

## 2021-03-17 DIAGNOSIS — E785 Hyperlipidemia, unspecified: Secondary | ICD-10-CM

## 2021-03-17 DIAGNOSIS — I25810 Atherosclerosis of coronary artery bypass graft(s) without angina pectoris: Secondary | ICD-10-CM

## 2021-03-30 ENCOUNTER — Encounter: Payer: Medicare Other | Admitting: Gastroenterology

## 2021-04-03 ENCOUNTER — Other Ambulatory Visit: Payer: Self-pay | Admitting: Internal Medicine

## 2021-04-03 NOTE — Telephone Encounter (Signed)
Please refill as per office routine med refill policy (all routine meds to be refilled for 3 mo or monthly (per pt preference) up to one year from last visit, then month to month grace period for 3 mo, then further med refills will have to be denied) ? ?

## 2021-04-06 ENCOUNTER — Encounter: Payer: Self-pay | Admitting: Gastroenterology

## 2021-04-06 ENCOUNTER — Other Ambulatory Visit: Payer: Self-pay

## 2021-04-06 ENCOUNTER — Ambulatory Visit: Payer: Medicare Other | Admitting: Gastroenterology

## 2021-04-06 VITALS — BP 138/68 | HR 66 | Ht 69.0 in | Wt 216.0 lb

## 2021-04-06 DIAGNOSIS — Z86010 Personal history of colonic polyps: Secondary | ICD-10-CM

## 2021-04-06 MED ORDER — PLENVU 140 G PO SOLR: 140.0000 g | ORAL | 0 refills | Status: DC

## 2021-04-06 NOTE — Patient Instructions (Signed)
If you are age 78 or older, your body mass index should be between 23-30. Your Body mass index is 31.9 kg/m. If this is out of the aforementioned range listed, please consider follow up with your Primary Care Provider.  If you are age 73 or younger, your body mass index should be between 19-25. Your Body mass index is 31.9 kg/m. If this is out of the aformentioned range listed, please consider follow up with your Primary Care Provider.   ________________________________________________________  The [facility] providers would like to encourage you to use [facility] to communicate with providers for non-urgent requests or questions.  Due to long hold times on the telephone, sending your provider a message by [facility] may be a faster and more efficient way to get a response.  Please allow 48 business hours for a response.  Please remember that this is for non-urgent requests.  _______________________________________________________  Troy Banks have been scheduled for a colonoscopy. Please follow written instructions given to you at your visit today.  Please pick up your prep supplies at the pharmacy within the next 1-3 days. If you use inhalers (even only as needed), please bring them with you on the day of your procedure.  Due to recent changes in healthcare laws, you may see the results of your imaging and laboratory studies on MyChart before your provider has had a chance to review them.  We understand that in some cases there may be results that are confusing or concerning to you. Not all laboratory results come back in the same time frame and the provider may be waiting for multiple results in order to interpret others.  Please give [place] 48 hours in order for your provider to thoroughly review all the results before contacting the office for clarification of your results.    It was a pleasure to see you today!  Thank you for trusting me with your gastrointestinal care!

## 2021-04-06 NOTE — Progress Notes (Signed)
[facility] Consult Note:  History: Troy Banks 04/06/2021  Referring provider: [name], MD  Reason for consult/chief complaint: Colonoscopy (Patient reports no Sx)   Subjective  HPI: Check came up for her 3-year recall recently and chart review indicated he had a reported difficult airway from a surgical record in 2016.  Although he had undergone 2 colonoscopies at our center without difficulty since then as noted below, anesthesia felt the patient no longer met criteria to have his procedure here with that history.  Troy Banks was here with his wife Troy Banks.  His bowel habits are regular without rectal bleeding.  He denies heartburn, nausea or vomiting.  2 adenomatous polyps removed in 2011 (outside hospital) 12 adenomatous polyps removed on colonoscopy with me April 2018 3 diminutive adenomatous polyps removed on colonoscopy with me October 2019  ROS:  Review of Systems  Constitutional:  Positive for fatigue. Negative for appetite change and unexpected weight change.  HENT:  Negative for mouth sores and voice change.   Eyes:  Negative for pain and redness.  Respiratory:  Negative for cough and shortness of breath.   Cardiovascular:  Negative for chest pain and palpitations.  Genitourinary:  Negative for dysuria and hematuria.  Musculoskeletal:  Positive for arthralgias. Negative for myalgias.  Skin:  Negative for pallor and rash.  Neurological:  Negative for weakness and headaches.  Hematological:  Negative for adenopathy.    Past Medical History: Past Medical History:  Diagnosis Date   Allergy    Anxiety    PTSD   Aortic stenosis, moderate 03/27/2015   post AVR echo Echo 11/16: Mild LVH, EF 55-60%, normal wall motion, grade 1 diastolic dysfunction, bioprosthetic AVR okay (mean gradient 18 mmHg) with trivial AI, mild LAE, atrial septal aneurysm, small effusion    Arthritis    "qwhere" (10/29/2015)   Cancer ([place])    skin cancer   Carotid  artery stenosis    Carotid US 11/17: 1-39% bilateral ICA stenosis. F/u prn   Cervical stenosis of spinal canal    fusion 2006   CHF (congestive heart failure) ([place])    "secondry to [facility]"   Closed left pilon fracture    COLONIC POLYPS, HX OF    Complication of anesthesia    "takes a lot to put him under and has woken up during surgery" Difficult to wake up . PTSD; "does not metabolize RX well"that he gets "violent", per pt.    Coronary artery disease    DIVERTICULOSIS, COLON    Dysrhythmia    2 bouts of afib post op and at home but corrected with medication.   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 ([place])    confirmed heterozygous 10/24/14 labs   PTSD (post-traumatic stress disorder)    PTSD (  post-traumatic stress disorder)    Sleep apnea    On a CPAP     Past Surgical History: Past Surgical History:  Procedure Laterality Date   ANKLE FUSION Left 03/17/2017   Procedure: FUSION  PILON FRACTURE WITH BONE GRAFTING;  Surgeon: Shona Needles, MD;  Location: [place];  Service: Orthopedics;  Laterality: Left;   ANTERIOR FUSION CERVICAL SPINE  2002   AORTIC VALVE REPLACEMENT N/A 03/30/2015   Procedure: AORTIC VALVE REPLACEMENT (AVR);  Surgeon: Grace Isaac, MD;  Location: [place];  Service: Open Heart Surgery;  Laterality: N/A;   CARDIAC CATHETERIZATION N/A 03/26/2015   Procedure: Left Heart Cath and Coronary Angiography;  Surgeon: Leonie Man, MD;  Location: [place] [facility];  Service: Cardiovascular;  Laterality: N/A;   CARDIAC VALVE REPLACEMENT     COLONOSCOPY W/ POLYPECTOMY     CORONARY ARTERY BYPASS GRAFT N/A 03/30/2015   Procedure: CORONARY ARTERY BYPASS GRAFTING (CABG) x four, using left internal mammary artery and left    leg greater saphenous vein harvested endoscopically ;  Surgeon: Grace Isaac, MD;  Location: [place];  Service: Open Heart Surgery;  Laterality: N/A;   CYSTOSCOPY/URETEROSCOPY/HOLMIUM LASER/STENT PLACEMENT Bilateral 11/07/2016   Procedure: CYSTOSCOPY/URETEROSCOPY/HOLMIUM LASER/STENT PLACEMENT;  Surgeon: Nickie Retort, MD;  Location: [facility];  Service: Urology;  Laterality: Bilateral;   CYSTOSCOPY/URETEROSCOPY/HOLMIUM LASER/STENT PLACEMENT Bilateral 11/23/2016   Procedure: CYSTOSCOPY/BILATERAL URETEROSCOPY/HOLMIUM LASER/STENT EXCHANGE;  Surgeon: Nickie Retort, MD;  Location: [facility];  Service: Urology;  Laterality: Bilateral;  NEEDS 90 MIN    EXTERNAL FIXATION LEG Left 02/22/2017   Procedure: EXTERNAL FIXATION LEFT ANKLE;  Surgeon: Renette Butters, MD;  Location: [place];  Service: Orthopedics;  Laterality: Left;   HEMORRHOID BANDING  1970s   INGUINAL HERNIA REPAIR Left [place]   "cut me open"   LEFT HEART CATH AND CORS/GRAFTS ANGIOGRAPHY N/A 10/25/2018   Procedure: LEFT HEART CATH AND CORS/GRAFTS ANGIOGRAPHY;  Surgeon: Leonie Man, MD;  Location: [place] [facility];  Service: Cardiovascular;  Laterality: N/A;   POSTERIOR FUSION CERVICAL SPINE  2006   TEE WITHOUT CARDIOVERSION N/A 03/30/2015   Procedure: TRANSESOPHAGEAL ECHOCARDIOGRAM (TEE);  Surgeon: Grace Isaac, MD;  Location: [place];  Service: Open Heart Surgery;  Laterality: N/A;   TESTICLE TORSION REDUCTION Right 1960   TOE FUSION Left 1985 & 2004   "great toe" and removal   TONSILLECTOMY     TOTAL HIP ARTHROPLASTY Right 10/27/2015   Procedure: RIGHT TOTAL HIP ARTHROPLASTY ANTERIOR APPROACH and  steroid injection right foot;  Surgeon: Mcarthur Rossetti, MD;  Location: [place];  Service: Orthopedics;  Laterality: Right;   TOTAL HIP ARTHROPLASTY Left 08/13/2019   TOTAL HIP ARTHROPLASTY Left 08/13/2019   Procedure: LEFT TOTAL HIP ARTHROPLASTY ANTERIOR APPROACH;  Surgeon: Mcarthur Rossetti, MD;  Location: [place];  Service: Orthopedics;  Laterality: Left;     Family History: Family History  Problem Relation Age of Onset   Heart disease Mother    Hypertension Mother    Diabetes Mother    Heart disease Father    Hypertension Father    Tremor Sister    Healthy Brother    Healthy Son    Prostate cancer Maternal Uncle    Hypertension Maternal Grandmother    Heart disease Maternal Grandmother    Hypertension Maternal Grandfather    Heart disease Maternal Grandfather    Hypertension Paternal Grandfather    Heart disease Paternal Grandfather  Diabetes Paternal Grandfather    Alcohol abuse Other    Arthritis Other    Drug abuse Sister    Healthy Daughter    Colon polyps Neg Hx    Esophageal cancer Neg Hx    Rectal cancer Neg Hx    Stomach cancer Neg Hx    Colon cancer Neg Hx     Social History: Social History   Socioeconomic History   Marital status: Married    Spouse name: Karmen Bongo, RN   Number of children: 2   Years of education: Not on file   Highest education level: Some college, no degree  Occupational History   Occupation: Retired Animal nutritionist  Tobacco Use   Smoking status: Former    Packs/day: 1.00    Years: 34.00    Pack years: 34.00    Types: Cigarettes    Quit date: 06/06/1990    Years since quitting: 30.8   Smokeless tobacco: Never  Vaping Use   Vaping Use: Never used  Substance and Sexual Activity   Alcohol use: Yes    Alcohol/week: 3.0 standard drinks    Types: 1 Cans of beer, 2 Shots of liquor per week    Comment: occassional   Drug use: No   Sexual activity: Yes  Other Topics Concern   Not on file  Social History  Narrative   Left Handed    One Story home    Social Determinants of Health   Financial Resource Strain: Low Risk    Difficulty of Paying Living Expenses: Not hard at all  Food Insecurity: No Food Insecurity   Worried About Charity fundraiser in the Last Year: Never true   Ran Out of Food in the Last Year: Never true  Transportation Needs: No Transportation Needs   Lack of Transportation (Medical): No   Lack of Transportation (Non-Medical): No  Physical Activity: Sufficiently Active   Days of Exercise per Week: 5 days   Minutes of Exercise per Session: 30 min  Stress: No Stress Concern Present   Feeling of Stress : Not at all  Social Connections: Socially Integrated   Frequency of Communication with Friends and Family: More than three times a week   Frequency of Social Gatherings with Friends and Family: Once a week   Attends Religious Services: More than 4 times per year   Active Member of [employer] or Organizations: Yes   Attends Archivist Meetings: More than 4 times per year   Marital Status: Married    Allergies: Allergies  Allergen Reactions   Acyclovir And Related Other (See Comments)    Accumulates and causes stroke-like side-effects due to cytochrome P450 enzyme deficiency   Benzodiazepines Other (See Comments)    Patient states he gets stroke symptoms with benzo's.    Compazine [Prochlorperazine Maleate] Other (See Comments)    Accumulates and causes stroke-like side-effects due to cytochrome P450 enzyme deficiency   Coreg [Carvedilol] Other (See Comments)    Accumulates and causes stroke-like side-effects due to cytochrome P450 enzyme deficiency Patient poor metabolizer of CYP2D6 - Coreg undergoes extensive hepatic (including 2D6)   Flomax [Tamsulosin Hcl] Other (See Comments)    Accumulates and causes stroke-like side-effects due to cytochrome P450 enzyme deficiency   Lisinopril Other (See Comments)    Hyperkalemia. Accumulates and causes stroke-like  side-effects due to cytochrome P450 enzyme deficiency.   Mobic [Meloxicam] Other (See Comments)    Accumulates and causes stroke-like side-effects due to cytochrome P450 enzyme deficiency  Ondansetron Other (See Comments)    Accumulates and causes stroke-like side-effects due to cytochrome P450 enzyme deficiency    Oxycodone Other (See Comments)    Accumulates and causes stroke-like side-effects due to cytochrome P450 enzyme deficiency   Promethazine Other (See Comments)    Accumulates and causes stroke-like side-effects due to cytochrome P450 enzyme deficiency   Sertraline Hcl Other (See Comments)    Accumulates and causes stroke-like side-effects due to cytochrome P450 enzyme deficiency   Tramadol Other (See Comments)    Accumulates and causes stroke-like side-effects due to cytochrome P450 enzyme deficiency   Atorvastatin Other (See Comments)    MYALGIAS   Colchicine Other (See Comments)    ataxia   Fentanyl Other (See Comments)    Accumulates and cause stroke like symptoms    Outpatient Meds: Current Outpatient Medications  Medication Sig Dispense Refill   acetaminophen (TYLENOL) 500 MG tablet TAKE TWO TABLETS BY MOUTH EVERY 6 HOURS AS NEEDED     allopurinol (ZYLOPRIM) 300 MG tablet TAKE 1 TABLET BY MOUTH EVERY DAY 90 tablet 0   amLODipine (NORVASC) 10 MG tablet TAKE 1 TABLET BY MOUTH EVERY DAY 90 tablet 0   Ascorbic Acid (VITAMIN C) 1000 MG tablet Take 1,000 mg by mouth 2 (two) times daily.      aspirin EC 81 MG tablet Take 81 mg by mouth every other day. Swallow whole.     benzonatate (TESSALON PERLES) 100 MG capsule Take 1 capsule (100 mg total) by mouth 3 (three) times daily as needed for cough. 60 capsule 0   BOTOX 200 units SOLR      calcium carbonate (TUMS EX) 750 MG chewable tablet Chew 1 tablet by mouth daily as needed for heartburn.      Carboxymethylcellulose Sodium (LUBRICANT EYE DROPS OP) Place 1 drop into both eyes 2 (two) times daily as needed (dry/irritated  eyes.).      Carboxymethylcellulose Sodium 0.25 % SOLN INSTILL 1 DROP IN EACH EYE AS DIRECTED 2 TO 4 TIMES A DAY     Cholecalciferol (VITAMIN D-3) 125 MCG (5000 UT) TABS Take 5,000 Units by mouth 2 (two) times a day.     famotidine (PEPCID) 40 MG tablet TAKE 1 TABLET BY MOUTH EVERY DAY 90 tablet 2   furosemide (LASIX) 40 MG tablet TAKE 1 TABLET BY MOUTH EVERY OTHER DAY 45 tablet 2   levothyroxine (SYNTHROID) 25 MCG tablet TAKE 1 TABLET BY MOUTH EVERY DAY WITH BREAKFAST 90 tablet 3   nitroGLYCERIN (NITROSTAT) 0.4 MG SL tablet PLACE 1 TABLET UNDER THE TONGUE EVERY 5 MINUTES AS NEEDED FOR CHEST PAIN. (Patient taking differently: Place 0.4 mg under the tongue every 5 (five) minutes as needed for chest pain.) 25 tablet 3   polyethylene glycol powder (GLYCOLAX/MIRALAX) 17 GM/SCOOP powder      polyethylene glycol powder (GLYCOLAX/MIRALAX) 17 GM/SCOOP powder Take by mouth.     rosuvastatin (CRESTOR) 10 MG tablet TAKE 1/2 TABLET BY MOUTH EVERY DAY 45 tablet 3   tiZANidine (ZANAFLEX) 4 MG tablet Take 1 tablet by mouth every 8 (eight) hours as needed.     Turmeric 500 MG CAPS Take 500 mg by mouth daily.      vitamin B-12 (CYANOCOBALAMIN) 1000 MCG tablet Take 1,000 mcg by mouth daily.      primidone (MYSOLINE) 50 MG tablet Take 1 tablet (50 mg total) by mouth once for 1 dose. (Patient taking differently: Take 50 mg by mouth daily.) 90 tablet 1   Current Facility-Administered Medications  Medication Dose Route Frequency Provider Last Rate Last Admin   sodium chloride flush (NS) 0.9 % injection 3 mL  3 mL Intravenous Q12H Almyra Deforest, PA          ___________________________________________________________________ Objective   Exam:  BP 138/68   Pulse 66   Ht 5' 9"  (1.753 m)   Wt 216 lb (98 kg)   SpO2 94%   BMI 31.90 kg/m  Wt Readings from Last 3 Encounters:  04/06/21 216 lb (98 kg)  03/17/21 216 lb 12.8 oz (98.3 kg)  11/26/20 213 lb (96.6 kg)    General: Well-appearing, normal vocal  quality Eyes: sclera anicteric, no redness ENT: oral mucosa moist without lesions, no cervical or supraclavicular lymphadenopathy CV: RRR without murmur, S1/S2, no JVD, no peripheral edema.  Sternotomy scar Resp: clear to auscultation bilaterally, normal RR and effort noted GI: soft, no tenderness, with active bowel sounds. No guarding or palpable organomegaly noted.  Rectus diastasis Skin; warm and dry, no rash or jaundice noted Neuro: awake, alert and oriented x 3. Normal gross motor function and fluent speech  Labs: CBC    Component Value Date/Time   WBC 7.5 06/10/2020 1524   RBC 4.81 06/10/2020 1524   HGB 15.1 06/10/2020 1524   HGB 15.9 10/19/2018 1411   HCT 44.9 06/10/2020 1524   HCT 46.9 10/19/2018 1411   PLT 148.0 (L) 06/10/2020 1524   PLT 146 (L) 10/19/2018 1411   MCV 93.4 06/10/2020 1524   MCV 89 10/19/2018 1411   MCH 31.0 08/14/2019 0423   MCHC 33.7 06/10/2020 1524   RDW 14.1 06/10/2020 1524   RDW 13.1 10/19/2018 1411   LYMPHSABS 1.5 06/10/2020 1524   MONOABS 0.8 06/10/2020 1524   EOSABS 0.1 06/10/2020 1524   BASOSABS 0.1 06/10/2020 1524   CMP Latest Ref Rng & Units 06/10/2020 12/04/2019 08/14/2019  Glucose 70 - 99 mg/dL 91 122(H) 129(H)  BUN 6 - 23 mg/dL 19 20 19   Creatinine 0.40 - 1.50 mg/dL 1.01 0.95 0.97  Sodium 135 - 145 mEq/L 140 140 136  Potassium 3.5 - 5.1 mEq/L 3.9 4.0 3.6  Chloride 96 - 112 mEq/L 103 105 103  CO2 19 - 32 mEq/L 31 27 23   Calcium 8.4 - 10.5 mg/dL 9.1 9.3 8.7(L)  Total Protein 6.0 - 8.3 g/dL 6.4 6.4 -  Total Bilirubin 0.2 - 1.2 mg/dL 0.8 0.8 -  Alkaline Phos 39 - 117 U/L 54 58 -  AST 0 - 37 U/L 16 19 -  ALT 0 - 53 U/L 22 22 -    Assessment: Encounter Diagnosis  Name Primary?   Personal history of colonic polyps Yes    Due for recall colonoscopy.  History of high risk airway, procedure must be done at [facility] outpatient lab.  Patient was agreeable after discussion of procedure and risks  The benefits and risks of the planned  procedure were described in detail with the patient or (when appropriate) their health care proxy.  Risks were outlined as including, but not limited to, bleeding, infection, perforation, adverse medication reaction leading to cardiac or pulmonary decompensation, pancreatitis (if ERCP).  The limitation of incomplete mucosal visualization was also discussed.  No guarantees or warranties were given.  All questions were answered for him and Troy Banks.  Thank you for the courtesy of this consult.  Please call me with any questions or concerns.  Nelida Meuse III  CC: Referring provider noted above

## 2021-04-16 ENCOUNTER — Ambulatory Visit: Payer: Medicare Other | Admitting: Neurology

## 2021-04-16 ENCOUNTER — Other Ambulatory Visit: Payer: Self-pay

## 2021-04-16 DIAGNOSIS — G43709 Chronic migraine without aura, not intractable, without status migrainosus: Secondary | ICD-10-CM

## 2021-04-16 MED ORDER — ONABOTULINUMTOXINA 100 UNITS IJ SOLR
200.0000 [IU] | Freq: Once | INTRAMUSCULAR | Status: AC
Start: 2021-04-16 — End: 2021-04-16
  Administered 2021-04-16: 155 [IU] via INTRAMUSCULAR

## 2021-04-16 NOTE — Procedures (Signed)
[facility]   History:  Diagnosis: chronic migraine   Result History  Hes not sure if helpful but wanted to proceed  Consent obtained from: The patient Benefits discussed included, but were not limited to decreased muscle tightness, increased joint range of motion, and decreased pain.  Risk discussed included, but were not limited pain and discomfort, bleeding, bruising, excessive weakness, venous thrombosis, muscle atrophy, asymmetry, death.  A copy of the patient medication guide was given to the patient which explains the blackbox warning.  Patients identity and treatment sites confirmed Yes.  .  Details of Procedure: Skin was cleaned with alcohol.  A 30 gauge, 75mm  needle was introduced to the target muscle.  Prior to injection, the needle plunger was aspirated to make sure the needle was not within a blood vessel.  There was no blood retrieved on aspiration.    Following is a summary of the muscles injected  And the amount of Botulinum toxin used:  Injections  Location Left  Right Units Number of sites        Corrugator 5.0 5.0 10 1 each side  Frontalis 5.0/5.0 5.0/5.0 20 2 each  procerus   5 1  temporalis 5.0 x 4 5.0 x 4 40 4 each  occipitalis 5.0x 3 5.0x 3 30 3  each  Cervical paraspinal 5.0/5.0 5.0/5.0 20 2 each  trapezius 5.0 x 3 5.0 x 3 30 3  each                          TOTAL UNITS:   155    Agent: Botulinum Type A ( Onobotulinum Toxin type A ).  200 unit vial of Botox used, each containing 50 units and freshly diluted with 2 mL of sterile, non-perserved saline   Total injected (Units):  155  Total wasted (Units):  45 Pt tolerated procedure well without complications.   Reinjection is anticipated in 3 months.

## 2021-04-22 DIAGNOSIS — K5730 Diverticulosis of large intestine without perforation or abscess without bleeding: Secondary | ICD-10-CM | POA: Diagnosis not present

## 2021-04-22 DIAGNOSIS — R311 Benign essential microscopic hematuria: Secondary | ICD-10-CM | POA: Diagnosis not present

## 2021-04-22 DIAGNOSIS — N200 Calculus of kidney: Secondary | ICD-10-CM | POA: Diagnosis not present

## 2021-04-22 DIAGNOSIS — R1084 Generalized abdominal pain: Secondary | ICD-10-CM | POA: Diagnosis not present

## 2021-04-22 DIAGNOSIS — K8020 Calculus of gallbladder without cholecystitis without obstruction: Secondary | ICD-10-CM | POA: Diagnosis not present

## 2021-04-22 DIAGNOSIS — R3129 Other microscopic hematuria: Secondary | ICD-10-CM | POA: Diagnosis not present

## 2021-05-06 NOTE — Progress Notes (Signed)
Assessment/Plan:    1.  Essential Tremor  -Status post focused ultrasound to the right VIM at [facility] in January, 2022. I do think he is more unbalanced than he was prior to the surgery (discussed this again today).  -increase primidone 50 mg twice per day, although did not see a lot of hand tremor (he c/o of it on R)   2.  Chronic migraine  -he did not find botox helpful and we will stop that.      -start gabapentin, 300 mg q hs.  Consulted pharmD and said okay with cP450 slow metabolism and said okay to take.  --PT to neck for HA - referred  -if above not helpful, could consider emgality (this is okay per pharm D) but insurance doesn't pay for this in [facility] population  -consider referral to [facility] if above not helpful  3.  Chronic LBP  -follows with Dr. Ninfa Linden and having injections with Dr. Ernestina Patches  Subjective:   Troy Banks was seen today in follow up for follow-up chronic migraine, status post Botox on Oct 16, 2020.  Pt with sig other, who supplements hx. last visit, the patient wanted to continue Botox at least 1 more time to see if he could get some effectiveness out of the Botox.  Last injections were November 11.  He reports today that it wasn't helpful.  They called and found out he cannot take the CGRP inhib.  His headache is R parietal and radiates to the R eye.  Headaches are R temporo-parietal and rarely go to behind the R eye, and if he has that it will create nausea and photosensitivity.    He did call me since our last visit stating that he wanted to go back on the primidone, in order to see if it would help tremor in the left hand.  We started it at starting dose, 50 mg daily. Wife thinks helpful but seems to "wear off" by the evening.   He did tell me at Botox that he thought both hands were shaking equally and wasn't sure sx helped. Wife states it was helpful but issues on other side as day progresses. Primidone is helping.  Tremor in the R hand gets worse as day  goes on.      Current prescribed movement disorder medications: Primidone, 50 mg daily (previously primidone 250 mg twice per day)   PREVIOUS MEDICATIONS: Not beta-blocker candidate because of bradycardia; not topiramate candidate because of history of nephrolithiasis; primidone  ALLERGIES:   Allergies  Allergen Reactions   Acyclovir And Related Other (See Comments)    Accumulates and causes stroke-like side-effects due to cytochrome P450 enzyme deficiency   Benzodiazepines Other (See Comments)    Patient states he gets stroke symptoms with benzo's.    Compazine [Prochlorperazine Maleate] Other (See Comments)    Accumulates and causes stroke-like side-effects due to cytochrome P450 enzyme deficiency   Coreg [Carvedilol] Other (See Comments)    Accumulates and causes stroke-like side-effects due to cytochrome P450 enzyme deficiency Patient poor metabolizer of CYP2D6 - Coreg undergoes extensive hepatic (including 2D6)   Flomax [Tamsulosin Hcl] Other (See Comments)    Accumulates and causes stroke-like side-effects due to cytochrome P450 enzyme deficiency   Lisinopril Other (See Comments)    Hyperkalemia. 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   Mobic [Meloxicam] Other (See Comments)    Accumulates and causes stroke-like side-effects due to cytochrome P450 enzyme deficiency   Ondansetron  Other (See Comments)    Accumulates and causes stroke-like side-effects due to cytochrome P450 enzyme deficiency    Oxycodone Other (See Comments)    Accumulates and causes stroke-like side-effects due to cytochrome P450 enzyme deficiency   Promethazine Other (See Comments)    Accumulates and causes stroke-like side-effects due to cytochrome P450 enzyme deficiency   Sertraline Hcl Other (See Comments)    Accumulates and causes stroke-like side-effects due to cytochrome P450 enzyme deficiency   Tramadol Other (See Comments)    Accumulates and  causes stroke-like side-effects due to cytochrome P450 enzyme deficiency   Atorvastatin Other (See Comments)    MYALGIAS   Colchicine Other (See Comments)    ataxia   Fentanyl Other (See Comments)    Accumulates and cause stroke like symptoms    CURRENT MEDICATIONS:  Outpatient Encounter Medications as of 05/10/2021  Medication Sig   acetaminophen (TYLENOL) 500 MG tablet TAKE TWO TABLETS BY MOUTH EVERY 6 HOURS AS NEEDED   allopurinol (ZYLOPRIM) 300 MG tablet TAKE 1 TABLET BY MOUTH EVERY DAY   amLODipine (NORVASC) 10 MG tablet TAKE 1 TABLET BY MOUTH EVERY DAY   Ascorbic Acid (VITAMIN C) 1000 MG tablet Take 1,000 mg by mouth 2 (two) times daily.    aspirin EC 81 MG tablet Take 81 mg by mouth every other day. Swallow whole.   benzonatate (TESSALON PERLES) 100 MG capsule Take 1 capsule (100 mg total) by mouth 3 (three) times daily as needed for cough.   BOTOX 200 units SOLR    calcium carbonate (TUMS EX) 750 MG chewable tablet Chew 1 tablet by mouth daily as needed for heartburn.    Carboxymethylcellulose Sodium (LUBRICANT EYE DROPS OP) Place 1 drop into both eyes 2 (two) times daily as needed (dry/irritated eyes.).    Carboxymethylcellulose Sodium 0.25 % SOLN INSTILL 1 DROP IN EACH EYE AS DIRECTED 2 TO 4 TIMES A DAY   Cholecalciferol (VITAMIN D-3) 125 MCG (5000 UT) TABS Take 5,000 Units by mouth 2 (two) times a day.   famotidine (PEPCID) 40 MG tablet TAKE 1 TABLET BY MOUTH EVERY DAY   furosemide (LASIX) 40 MG tablet TAKE 1 TABLET BY MOUTH EVERY OTHER DAY   gabapentin (NEURONTIN) 300 MG capsule Take 1 capsule (300 mg total) by mouth at bedtime.   levothyroxine (SYNTHROID) 25 MCG tablet TAKE 1 TABLET BY MOUTH EVERY DAY WITH BREAKFAST   nitroGLYCERIN (NITROSTAT) 0.4 MG SL tablet PLACE 1 TABLET UNDER THE TONGUE EVERY 5 MINUTES AS NEEDED FOR CHEST PAIN. (Patient taking differently: Place 0.4 mg under the tongue every 5 (five) minutes as needed for chest pain.)   PEG-KCl-NaCl-NaSulf-Na Asc-C  (PLENVU) 140 g SOLR Take 140 g by mouth as directed.   polyethylene glycol powder (GLYCOLAX/MIRALAX) 17 GM/SCOOP powder    polyethylene glycol powder (GLYCOLAX/MIRALAX) 17 GM/SCOOP powder Take by mouth.   rosuvastatin (CRESTOR) 10 MG tablet TAKE 1/2 TABLET BY MOUTH EVERY DAY   tiZANidine (ZANAFLEX) 4 MG tablet Take 1 tablet by mouth every 8 (eight) hours as needed.   Turmeric 500 MG CAPS Take 500 mg by mouth daily.    vitamin B-12 (CYANOCOBALAMIN) 1000 MCG tablet Take 1,000 mcg by mouth daily.    primidone (MYSOLINE) 50 MG tablet Take 1 tablet (50 mg total) by mouth 2 (two) times daily.   [DISCONTINUED] primidone (MYSOLINE) 50 MG tablet Take 1 tablet (50 mg total) by mouth once for 1 dose. (Patient taking differently: Take 50 mg by mouth daily.)   [DISCONTINUED] ranitidine (ZANTAC)  150 MG tablet TAKE 1 TABLET BY MOUTH 2 TIMES DAILY (Patient taking differently: No sig reported)   Facility-Administered Encounter Medications as of 05/10/2021  Medication   sodium chloride flush (NS) 0.9 % injection 3 mL     Objective:    PHYSICAL EXAMINATION:    VITALS:   Vitals:   05/10/21 0835  BP: (!) 142/82  Pulse: 82  SpO2: 93%  Weight: 218 lb 3.2 oz (99 kg)  Height: 5\' 7"  (1.702 m)     GEN:  The patient appears stated age and is in NAD. HEENT:  Normocephalic, atraumatic.  The mucous membranes are moist.   Neurological examination:  Orientation: The patient is alert and oriented x3. Cranial nerves: There is good facial symmetry. The speech is fluent and clear. Soft palate rises symmetrically and there is no tongue deviation. Hearing is intact to conversational tone. Sensation: Sensation is intact to light touch throughout Motor: Strength is at least antigravity x4.  Movement examination: Tone: There is normal tone in the UE/LE Abnormal movements: There is no rest or intention tremor in the left hand. Very, very mild intention tremor on the R.   Coordination:  There is no decremation with  RAM's,  Gait and Station: The patient is wide-based and slightly unbalanced    Chemistry      Component Value Date/Time   NA 140 06/10/2020 1524   NA 143 10/19/2018 1411   K 3.9 06/10/2020 1524   CL 103 06/10/2020 1524   CO2 31 06/10/2020 1524   BUN 19 06/10/2020 1524   BUN 17 10/19/2018 1411   CREATININE 1.01 06/10/2020 1524   CREATININE 0.94 03/04/2016 0918      Component Value Date/Time   CALCIUM 9.1 06/10/2020 1524   ALKPHOS 54 06/10/2020 1524   AST 16 06/10/2020 1524   ALT 22 06/10/2020 1524   BILITOT 0.8 06/10/2020 1524      Lab Results  Component Value Date   WBC 7.5 06/10/2020   HGB 15.1 06/10/2020   HCT 44.9 06/10/2020   MCV 93.4 06/10/2020   PLT 148.0 (L) 06/10/2020   Lab Results  Component Value Date   TSH 3.19 11/26/2020     Chemistry      Component Value Date/Time   NA 140 06/10/2020 1524   NA 143 10/19/2018 1411   K 3.9 06/10/2020 1524   CL 103 06/10/2020 1524   CO2 31 06/10/2020 1524   BUN 19 06/10/2020 1524   BUN 17 10/19/2018 1411   CREATININE 1.01 06/10/2020 1524   CREATININE 0.94 03/04/2016 0918      Component Value Date/Time   CALCIUM 9.1 06/10/2020 1524   ALKPHOS 54 06/10/2020 1524   AST 16 06/10/2020 1524   ALT 22 06/10/2020 1524   BILITOT 0.8 06/10/2020 1524      Lab Results  Component Value Date   ESRSEDRATE 4 07/22/2020      Total time spent on today's visit was 30 minutes, including both face-to-face time and nonface-to-face time.  Time included that spent on review of records (prior notes available to me/labs/imaging if pertinent), discussing treatment and goals, answering patient's questions and coordinating care.  Cc:  [name], MD

## 2021-05-10 ENCOUNTER — Ambulatory Visit: Payer: Medicare Other | Admitting: Neurology

## 2021-05-10 ENCOUNTER — Other Ambulatory Visit: Payer: Self-pay

## 2021-05-10 ENCOUNTER — Encounter: Payer: Self-pay | Admitting: Neurology

## 2021-05-10 VITALS — BP 142/82 | HR 82 | Ht 67.0 in | Wt 218.2 lb

## 2021-05-10 DIAGNOSIS — G250 Essential tremor: Secondary | ICD-10-CM

## 2021-05-10 DIAGNOSIS — G43719 Chronic migraine without aura, intractable, without status migrainosus: Secondary | ICD-10-CM

## 2021-05-10 DIAGNOSIS — G43709 Chronic migraine without aura, not intractable, without status migrainosus: Secondary | ICD-10-CM

## 2021-05-10 MED ORDER — PRIMIDONE 50 MG PO TABS: 50.0000 mg | ORAL_TABLET | Freq: Two times a day (BID) | ORAL | 2 refills | Status: DC

## 2021-05-10 MED ORDER — GABAPENTIN 300 MG PO CAPS: 300.0000 mg | ORAL_CAPSULE | Freq: Every day | ORAL | 1 refills | Status: DC

## 2021-05-10 NOTE — Patient Instructions (Signed)
Increase primidone, 50 mg twice per day Start gabapentin 300 mg at bedtime

## 2021-05-11 ENCOUNTER — Ambulatory Visit: Payer: Medicare Other | Admitting: Podiatry

## 2021-05-11 ENCOUNTER — Encounter: Payer: Self-pay | Admitting: Podiatry

## 2021-05-11 DIAGNOSIS — E119 Type 2 diabetes mellitus without complications: Secondary | ICD-10-CM | POA: Diagnosis not present

## 2021-05-11 DIAGNOSIS — M79674 Pain in right toe(s): Secondary | ICD-10-CM | POA: Diagnosis not present

## 2021-05-11 DIAGNOSIS — M79675 Pain in left toe(s): Secondary | ICD-10-CM

## 2021-05-11 DIAGNOSIS — B351 Tinea unguium: Secondary | ICD-10-CM

## 2021-05-11 NOTE — Progress Notes (Signed)
This patient returns to the office for evaluation and treatment of long thick painful nails .  This patient is unable to trim his own nails since the patient cannot reach the feet.  Patient says the nails are painful walking and wearing his shoes.  He returns for preventive foot care services.    General Appearance  Alert, conversant and in no acute stress.  Vascular  Dorsalis pedis and posterior tibial  pulses are palpable  bilaterally.  Capillary return is within normal limits  bilaterally. Temperature is within normal limits  bilaterally.  Neurologic  Senn-Weinstein monofilament wire test within normal limits  bilaterally. Muscle power within normal limits bilaterally.  Nails Thick disfigured discolored nails with subungual debris  from hallux to fifth toes bilaterally. No evidence of bacterial infection or drainage bilaterally.  Orthopedic  No limitations of motion  feet .  No crepitus or effusions noted.  No bony pathology or digital deformities noted.  Ankle/STJ immobile left foot/ankle.  Fused 1st MPJ ight foot.  Skin  normotropic skin with no porokeratosis noted bilaterally.  No signs of infections or ulcers noted.     Onychomycosis  Pain in toes right foot  Pain in toes left foot  Debridement  of nails  1-5  B/L with a nail nipper.  Nails were then filed using a dremel tool with no incidents.    RTC  3 month   Gardiner Barefoot DPM

## 2021-05-12 ENCOUNTER — Ambulatory Visit: Payer: Medicare Other | Attending: Neurology | Admitting: Physical Therapy

## 2021-05-12 ENCOUNTER — Encounter: Payer: Self-pay | Admitting: Physical Therapy

## 2021-05-12 ENCOUNTER — Other Ambulatory Visit: Payer: Self-pay

## 2021-05-12 DIAGNOSIS — G43719 Chronic migraine without aura, intractable, without status migrainosus: Secondary | ICD-10-CM | POA: Diagnosis not present

## 2021-05-12 DIAGNOSIS — M6281 Muscle weakness (generalized): Secondary | ICD-10-CM | POA: Insufficient documentation

## 2021-05-12 DIAGNOSIS — G8929 Other chronic pain: Secondary | ICD-10-CM | POA: Insufficient documentation

## 2021-05-12 DIAGNOSIS — R29898 Other symptoms and signs involving the musculoskeletal system: Secondary | ICD-10-CM | POA: Insufficient documentation

## 2021-05-12 DIAGNOSIS — R519 Headache, unspecified: Secondary | ICD-10-CM | POA: Diagnosis not present

## 2021-05-12 DIAGNOSIS — M545 Low back pain, unspecified: Secondary | ICD-10-CM | POA: Diagnosis not present

## 2021-05-12 DIAGNOSIS — R293 Abnormal posture: Secondary | ICD-10-CM | POA: Diagnosis not present

## 2021-05-12 NOTE — Patient Instructions (Addendum)
Access Code: [email or web address] URL: [email or web address] Date: 05/12/2021 Prepared by: [facility]  Exercises Supine Cervical Retraction with Towel - 1-2 x daily - 7 x weekly - 2 sets - 5 reps - 3-5 sec hold Supine Scapular Retraction - 1-2 x daily - 7 x weekly - 2 sets - 5 reps - 3-5 sec hold  Patient Education Trigger Point Dry Needling

## 2021-05-12 NOTE — Therapy (Addendum)
[facility] [address], [place], [place], [address] Phone: [phone]   Fax:  [phone]  Physical Therapy Evaluation  Patient Details  Name: Troy Banks MRN: [id number] Date of Birth: Apr 11, 1941 Referring Provider (PT): [name], Josephina Shih Date: 05/12/2021   PT End of Session - 05/12/21 1307     Visit Number 1    Number of Visits 9    Date for PT Re-Evaluation 06/04/21    Authorization Type UHC Medicare    PT Start Time 0934    PT Stop Time 1019    PT Time Calculation (min) 45 min    Activity Tolerance Patient tolerated treatment well;No increased pain    Behavior During Therapy [facility] for tasks assessed/performed             Past Medical History:  Diagnosis Date   Allergy    Anxiety    PTSD   Aortic stenosis, moderate 03/27/2015   post AVR echo Echo 11/16: Mild LVH, EF 55-60%, normal wall motion, grade 1 diastolic dysfunction, bioprosthetic AVR okay (mean gradient 18 mmHg) with trivial AI, mild LAE, atrial septal aneurysm, small effusion    Arthritis    "qwhere" (10/29/2015)   Cancer ([place])    skin cancer   Carotid artery stenosis    Carotid US 11/17: 1-39% bilateral ICA stenosis. F/u prn   Cervical stenosis of spinal canal    fusion 2006   CHF (congestive heart failure) ([place])    "secondry to [facility]"   Closed left pilon fracture    COLONIC POLYPS, HX OF    Complication of anesthesia    "takes a lot to put him under and has woken up during surgery" Difficult to wake up . PTSD; "does not metabolize RX well"that he gets "violent", per pt.    Coronary artery disease    DIVERTICULOSIS, COLON    Dysrhythmia    2 bouts of afib post op and at home but corrected with medication.   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 ([place])    confirmed heterozygous 10/24/14 labs   PTSD (post-traumatic stress disorder)    PTSD (post-traumatic stress disorder)    Sleep apnea    On a CPAP    Past Surgical History:  Procedure Laterality Date   ANKLE FUSION Left 03/17/2017   Procedure: FUSION  PILON FRACTURE WITH BONE GRAFTING;  Surgeon: Shona Needles, MD;  Location: [place];  Service: Orthopedics;  Laterality: Left;   ANTERIOR FUSION CERVICAL SPINE  2002   AORTIC VALVE REPLACEMENT N/A 03/30/2015   Procedure: AORTIC VALVE REPLACEMENT (AVR);  Surgeon: Grace Isaac, MD;  Location: [place];  Service: Open Heart Surgery;  Laterality: N/A;   CARDIAC CATHETERIZATION N/A 03/26/2015  Procedure: Left Heart Cath and Coronary Angiography;  Surgeon: Leonie Man, MD;  Location: [place] [facility];  Service: Cardiovascular;  Laterality: N/A;   CARDIAC VALVE REPLACEMENT     COLONOSCOPY W/ POLYPECTOMY     CORONARY ARTERY BYPASS GRAFT N/A 03/30/2015   Procedure: CORONARY ARTERY BYPASS GRAFTING (CABG) x four, using left internal mammary artery and left    leg greater saphenous vein harvested endoscopically ;  Surgeon: Grace Isaac, MD;  Location: [place];  Service: Open Heart Surgery;  Laterality: N/A;   CYSTOSCOPY/URETEROSCOPY/HOLMIUM LASER/STENT PLACEMENT Bilateral 11/07/2016   Procedure: CYSTOSCOPY/URETEROSCOPY/HOLMIUM LASER/STENT PLACEMENT;  Surgeon: Nickie Retort, MD;  Location: [facility];  Service: Urology;  Laterality: Bilateral;   CYSTOSCOPY/URETEROSCOPY/HOLMIUM LASER/STENT PLACEMENT Bilateral 11/23/2016   Procedure: CYSTOSCOPY/BILATERAL URETEROSCOPY/HOLMIUM  LASER/STENT EXCHANGE;  Surgeon: Nickie Retort, MD;  Location: [facility];  Service: Urology;  Laterality: Bilateral;  NEEDS 90 MIN    EXTERNAL FIXATION LEG Left 02/22/2017   Procedure: EXTERNAL FIXATION LEFT ANKLE;  Surgeon: Renette Butters, MD;  Location: [place];  Service: Orthopedics;  Laterality: Left;   HEMORRHOID BANDING  1970s   INGUINAL HERNIA REPAIR Left [place]   "cut me open"   LEFT HEART CATH AND CORS/GRAFTS ANGIOGRAPHY N/A 10/25/2018   Procedure: LEFT HEART CATH AND CORS/GRAFTS ANGIOGRAPHY;  Surgeon: Leonie Man, MD;  Location: [place] [facility];  Service: Cardiovascular;  Laterality: N/A;   POSTERIOR FUSION CERVICAL SPINE  2006   TEE WITHOUT CARDIOVERSION N/A 03/30/2015   Procedure: TRANSESOPHAGEAL ECHOCARDIOGRAM (TEE);  Surgeon: Grace Isaac, MD;  Location: [place];  Service: Open Heart Surgery;  Laterality: N/A;   TESTICLE TORSION REDUCTION Right 1960   TOE FUSION Left 1985 & 2004   "great toe" and removal   TONSILLECTOMY     TOTAL HIP ARTHROPLASTY Right 10/27/2015   Procedure: RIGHT TOTAL HIP ARTHROPLASTY ANTERIOR APPROACH and steroid injection right foot;  Surgeon: Mcarthur Rossetti, MD;  Location: [place];  Service: Orthopedics;  Laterality: Right;   TOTAL HIP ARTHROPLASTY Left 08/13/2019   TOTAL HIP ARTHROPLASTY Left 08/13/2019   Procedure: LEFT TOTAL HIP ARTHROPLASTY ANTERIOR APPROACH;  Surgeon: Mcarthur Rossetti, MD;  Location: [place];  Service: Orthopedics;  Laterality: Left;    There were no vitals filed for this visit.    Subjective Assessment - 05/12/21 0938     Subjective Has headache in R lateral/superior aspect and goes to behind R eye 24/7 has been going on for months.  Stopped trying to take things because it was upsetting my gut.    Patient is accompained by: Family member   wife   Diagnostic tests Has had CT scan and MRI-cleared.  Eye exam at [facility] in [place]    Currently in Pain? Yes    Pain Score 5     at worst, "20/10", at best-maybe 3/10   Pain Location Head    Pain Orientation Right    Pain Descriptors / Indicators Aching;Radiating    Pain Type Chronic pain    Pain Radiating Towards R jaw and R eye    Pain Onset Other (comment)   months   Pain Frequency Constant   months   Aggravating Factors  daily living activities    Pain Relieving Factors riding motorcycle helps relax                [facility] PT Assessment - 05/12/21 0001       Assessment  Medical Diagnosis intractable migraine    Referring Provider (PT) [name], Wells Guiles    Onset Date/Surgical Date 05/10/21   MD referral     Precautions   Precautions None    Precaution Comments Hx of neck fusion; no precautions per patient/wife report      Balance Screen   Has the patient fallen in the past 6 months No    Has the patient had a decrease in activity level because of a fear of falling?  No    Is the patient reluctant to leave their home because of a fear of falling?  No      Prior Function   Level of Independence Independent    Leisure Enjoys riding motorcycle; enjoyed travelling in the past      Observation/Other Assessments   Focus on Therapeutic Outcomes (FOTO)  NA    Other Surveys  Neck Disability Index    Neck Disability Index  Score 12/50 (5-14 points indicates mild disability)   Rates 3 for concentration and headaches     Posture/Postural Control   Posture/Postural Control Postural limitations    Postural Limitations Rounded Shoulders;Forward head    Posture Comments Sits with hands supported at mat, elevated shoulders      ROM / Strength   AROM / PROM / Strength AROM;PROM;Strength      AROM   Overall AROM  Deficits    Overall AROM Comments pt with hx of cervical fusion    AROM Assessment Site Cervical    Cervical Flexion 34   normal 0-45   Cervical Extension 25   normal 0-45   Cervical - Right Side Bend 20   nomral 0-45   Cervical - Left Side Bend 10   normal 0-45   Cervical - Right Rotation 39    normal 0-60   Cervical - Left Rotation 38   normal 0-60     Palpation   Palpation comment Pt tender to palpation along R upper trapezius muscle, tender to palpation at R trapezius just lateral to spine in upper thoracic area.  Also tender to palpation levator scapulae on R.  Pt tender to palpation at R occiput.      High Level Balance   High Level Balance Activites Other (comment)   Pt stands eyes open and eyes closed on solid surface x 30 seconds; pt stands eyes open compliant surface 30 seconds, eyes closed on compliant surface 6 seconds (*Indicates decreased vestibular system use for balance)                   Vestibular Assessment - 05/12/21 0001       Symptom Behavior   Subjective history of current problem Unsteadiness, "wobbly with walking"                Objective measurements completed on examination: See above findings.        Therapeutic Exercise  Supine neck retraction, 5 reps, 2 sets with one pillow, initial reps with tactile cues of PT's hands at occiput  Supine scapular retraction, 5 reps, x 2 sets        PT Education - 05/12/21 1306     Education Details Initial HEP for neck/shoulder exercises    Person(s) Educated Patient;Spouse    Methods Explanation;Demonstration;Verbal cues;Handout    Comprehension Verbalized understanding;Returned demonstration                 PT Long Term Goals - 05/12/21 1429       PT  LONG TERM GOAL #1   Title Pt will be independent with HEP for improved flexibility, posture, balance, and decreased pain.  TARGET 06/04/2021    Time 4    Period Weeks    Status New      PT LONG TERM GOAL #2   Title Pt will improve pain to less than or equal to 2/10 for daily activities and funcitonal mobility.    Baseline 5/10 at eval    Time 4    Period Weeks    Status New      PT LONG TERM GOAL #3   Title Pt will improve neck flexibility in lateral flexion by 5 degrees each direction, for improved flexibility  and to demonstrate decreased pain with daily activities.    Baseline R lateral flexion 20 degrees, L lateral flexion 10 degrees    Time 4    Period Weeks    Status New      PT LONG TERM GOAL #4   Title Pt will improve balance standing on foam surface, eyes closed, at least 15 seconds for improved vestibular system use for balance.    Baseline 6 sec    Time 4    Period Weeks    Status New                    Plan - 05/12/21 1308     Clinical Impression Statement Pt is a 80 year old male referred to OPPT for intractable migraines.  Pt has reports of increased headaches for multiple months; he also reports unsteadiness with gait and "wobbly" balance.  Upon evaluation today, pt presents with abnormal posture, decreased flexibility, muscle tightness/possible trigger points, decreased balance/decreased vestibular use for balance.  Per NDI, pt rates neck pain in the mild disability range.  PT feels that posture, positioning, muscle guarding may be contributing to pt's pain and recommends that pt would benefit from skilled PT to address pain, flexibility, education, balance training as apporpirate to assist with improved funcitonal mobility with decreased pain.    Personal Factors and Comorbidities Comorbidity 3+    Comorbidities PMH:  arthritis, cervial stenosis, CHF, GERD, HTN, PTSD, THA R 2017 and L 2021  anterior and posterior cervical fusions, last one was 2006    Examination-Activity Limitations Locomotion Level    Examination-Participation Restrictions Community Activity;Other   travel   Stability/Clinical Decision Making Stable/Uncomplicated    Clinical Decision Making Low    Rehab Potential Good    PT Frequency 2x / week    PT Duration 4 weeks   including eval week   PT Treatment/Interventions ADLs/Self Care Home Management;Electrical Stimulation;Traction;Moist Heat;Functional mobility training;Therapeutic activities;Therapeutic exercise;Balance training;Neuromuscular  re-education;Manual techniques;Patient/family education;Passive range of motion;Dry needling    PT Next Visit Plan Review HEP, dry needling to R trapezius, additional exercises for neck flexibility stretching and postural strengthening    Consulted and Agree with Plan of Care Patient             Patient will benefit from skilled therapeutic intervention in order to improve the following deficits and impairments:  Pain, Difficulty walking, Impaired flexibility, Postural dysfunction, Decreased balance, Decreased mobility  Visit Diagnosis: Other symptoms and signs involving the musculoskeletal system - Plan: PT plan of care cert/re-cert  Abnormal posture - Plan: PT plan of care cert/re-cert  Chronic intractable headache, unspecified headache type - Plan: PT plan of care cert/re-cert     Problem List Patient Active Problem List   Diagnosis Date Noted   Encounter  for fitting and adjustment of hearing aid 11/26/2020   Encounter for fitting and adjustment of spectacles and contact lenses 11/26/2020   DNR (do not resuscitate) discussion 11/26/2020   Aortic atherosclerosis ([place]) 11/26/2020   Allergic rhinitis 10/05/2020   Cough 10/05/2020   Sensorineural hearing loss, bilateral 09/29/2020   Hallux rigidus 09/23/2020   Retinoschisis 09/23/2020   RUQ pain 06/10/2020   History of lacunar cerebrovascular accident 04/05/2020   Headache 03/16/2020   Ankle pain 01/22/2020   Hypothyroidism 11/26/2019   Epididymitis 08/28/2019   Unilateral primary osteoarthritis, left hip 08/13/2019   Status post total replacement of left hip 08/13/2019   Abdominal wall hernia 11/02/2018   Progressive angina ([place]) 10/25/2018   DOE (dyspnea on exertion) 10/25/2018   Abscess of right buttock 11/27/2017   Acute sinus infection 11/05/2017   Pilon fracture 03/17/2017   Pilon fracture of left tibia 03/17/2017   At risk for adverse drug reaction 02/28/2017   Closed fracture dislocation of ankle joint, left,  with delayed healing, subsequent encounter 02/22/2017   Renal stone 10/26/2016   Gross hematuria 10/26/2016   Bladder neck obstruction 10/26/2016   External hemorrhoid, thrombosed 06/10/2016   Internal hemorrhoids 06/10/2016   Lumbar back pain 05/19/2016   Osteoarthritis of right hip 10/27/2015   Encounter for well adult exam with abnormal findings 10/27/2015   Chronic chest pain 09/22/2015   Acute gouty arthritis 06/11/2015   Coronary artery disease involving native coronary artery with angina pectoris ([place]) 06/11/2015   Chronic diastolic CHF (congestive heart failure) ([place]) 06/11/2015   H/O tissue AVR Oct 2016 06/10/2015   PAF post CABG/AVR- Amiodarone 06/10/2015   Dyslipidemia 06/10/2015   S/P CABG x 4-Oct 2016 03/30/2015   H/O NSTEMI-Oct 2016 03/27/2015   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 ([place])    Depression 09/08/2014   GERD (gastroesophageal reflux disease)    Essential tremor 03/31/2014   Iliotibial band syndrome affecting left lower leg 01/01/2014   Spondylolisthesis at L4-L5 level 01/01/2014   Sinus bradycardia 12/31/2013   Greater trochanteric bursitis of right hip 12/04/2013   OSA on CPAP    Diet-controlled diabetes mellitus ([place]) 07/08/2009   COLONIC POLYPS, HX OF 12/31/2008   GOUT 12/22/2008   Essential hypertension 12/22/2008   Diverticulosis of colon 12/22/2008   NEPHROLITHIASIS, HX OF 12/22/2008    Keaten Mashek W., PT 05/12/2021, 2:43 PM  [facility] [address], [place], [place], [address] Phone: [phone]   Fax:  [phone]  Name: Troy Banks MRN: [id number] Date of Birth: 1940/09/29

## 2021-05-14 ENCOUNTER — Encounter: Payer: Self-pay | Admitting: Rehabilitative and Restorative Service Providers"

## 2021-05-14 ENCOUNTER — Other Ambulatory Visit: Payer: Self-pay

## 2021-05-14 ENCOUNTER — Ambulatory Visit: Payer: Medicare Other | Admitting: Rehabilitative and Restorative Service Providers"

## 2021-05-14 DIAGNOSIS — R519 Headache, unspecified: Secondary | ICD-10-CM | POA: Diagnosis not present

## 2021-05-14 DIAGNOSIS — R29898 Other symptoms and signs involving the musculoskeletal system: Secondary | ICD-10-CM | POA: Diagnosis not present

## 2021-05-14 DIAGNOSIS — G43719 Chronic migraine without aura, intractable, without status migrainosus: Secondary | ICD-10-CM | POA: Diagnosis not present

## 2021-05-14 DIAGNOSIS — R293 Abnormal posture: Secondary | ICD-10-CM | POA: Diagnosis not present

## 2021-05-14 DIAGNOSIS — M6281 Muscle weakness (generalized): Secondary | ICD-10-CM | POA: Diagnosis not present

## 2021-05-14 DIAGNOSIS — G8929 Other chronic pain: Secondary | ICD-10-CM | POA: Diagnosis not present

## 2021-05-14 DIAGNOSIS — M545 Low back pain, unspecified: Secondary | ICD-10-CM

## 2021-05-14 NOTE — Therapy (Signed)
[facility] [address] [place], [place], [address] Phone: [phone]   Fax:  [phone]  Physical Therapy Treatment  Patient Details  Name: Troy Banks MRN: [id number] Date of Birth: 1940/07/27 Referring Provider (PT): [name], Josephina Shih Date: 05/14/2021   PT End of Session - 05/14/21 0811     Visit Number 2    Number of Visits 9    Date for PT Re-Evaluation 06/04/21    Authorization Type [employer] Medicare    PT Start Time 0802    PT Stop Time 0848    PT Time Calculation (min) 46 min    Activity Tolerance Patient tolerated treatment well;No increased pain    Behavior During Therapy [facility] for tasks assessed/performed             Past Medical History:  Diagnosis Date   Allergy    Anxiety    PTSD   Aortic stenosis, moderate 03/27/2015   post AVR echo Echo 11/16: Mild LVH, EF 55-60%, normal wall motion, grade 1 diastolic dysfunction, bioprosthetic AVR okay (mean gradient 18 mmHg) with trivial AI, mild LAE, atrial septal aneurysm, small effusion    Arthritis    "qwhere" (10/29/2015)   Cancer ([place])    skin cancer   Carotid artery stenosis    Carotid US 11/17: 1-39% bilateral ICA stenosis. F/u prn   Cervical stenosis of spinal canal    fusion 2006   CHF (congestive heart failure) ([place])    "secondry to [facility]"   Closed left pilon fracture    COLONIC POLYPS, HX OF    Complication of anesthesia    "takes a lot to put him under and has woken up during surgery" Difficult to wake up . PTSD; "does not metabolize RX well"that he gets "violent", per pt.    Coronary artery disease    DIVERTICULOSIS, COLON    Dysrhythmia    2 bouts of afib post op and at home but corrected with medication.   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 ([place])    confirmed heterozygous 10/24/14 labs   PTSD (post-traumatic stress disorder)    PTSD (post-traumatic stress disorder)    Sleep apnea    On a CPAP    Past Surgical History:  Procedure Laterality Date   ANKLE FUSION Left 03/17/2017   Procedure: FUSION  PILON FRACTURE WITH BONE GRAFTING;  Surgeon: Shona Needles, MD;  Location: [place];  Service: Orthopedics;  Laterality: Left;   ANTERIOR FUSION CERVICAL SPINE  2002   AORTIC VALVE REPLACEMENT N/A 03/30/2015   Procedure: AORTIC VALVE REPLACEMENT (AVR);  Surgeon: Grace Isaac, MD;  Location: [place];  Service: Open Heart Surgery;  Laterality: N/A;   CARDIAC CATHETERIZATION N/A 03/26/2015  Procedure: Left Heart Cath and Coronary Angiography;  Surgeon: Leonie Man, MD;  Location: [place] [facility];  Service: Cardiovascular;  Laterality: N/A;   CARDIAC VALVE REPLACEMENT     COLONOSCOPY W/ POLYPECTOMY     CORONARY ARTERY BYPASS GRAFT N/A 03/30/2015   Procedure: CORONARY ARTERY BYPASS GRAFTING (CABG) x four, using left internal mammary artery and left    leg greater saphenous vein harvested endoscopically ;  Surgeon: Grace Isaac, MD;  Location: [place];  Service: Open Heart Surgery;  Laterality: N/A;   CYSTOSCOPY/URETEROSCOPY/HOLMIUM LASER/STENT PLACEMENT Bilateral 11/07/2016   Procedure: CYSTOSCOPY/URETEROSCOPY/HOLMIUM LASER/STENT PLACEMENT;  Surgeon: Nickie Retort, MD;  Location: [facility];  Service: Urology;  Laterality: Bilateral;   CYSTOSCOPY/URETEROSCOPY/HOLMIUM LASER/STENT PLACEMENT Bilateral 11/23/2016   Procedure: CYSTOSCOPY/BILATERAL URETEROSCOPY/HOLMIUM  LASER/STENT EXCHANGE;  Surgeon: Nickie Retort, MD;  Location: [facility];  Service: Urology;  Laterality: Bilateral;  NEEDS 90 MIN    EXTERNAL FIXATION LEG Left 02/22/2017   Procedure: EXTERNAL FIXATION LEFT ANKLE;  Surgeon: Renette Butters, MD;  Location: [place];  Service: Orthopedics;  Laterality: Left;   HEMORRHOID BANDING  1970s   INGUINAL HERNIA REPAIR Left Pillager   "cut me open"   LEFT HEART CATH AND CORS/GRAFTS ANGIOGRAPHY N/A 10/25/2018   Procedure: LEFT HEART CATH AND CORS/GRAFTS ANGIOGRAPHY;  Surgeon: Leonie Man, MD;  Location: [place] [facility];  Service: Cardiovascular;  Laterality: N/A;   POSTERIOR FUSION CERVICAL SPINE  2006   TEE WITHOUT CARDIOVERSION N/A 03/30/2015   Procedure: TRANSESOPHAGEAL ECHOCARDIOGRAM (TEE);  Surgeon: Grace Isaac, MD;  Location: [place];  Service: Open Heart Surgery;  Laterality: N/A;   TESTICLE TORSION REDUCTION Right 1960   TOE FUSION Left 1985 & 2004   "great toe" and removal   TONSILLECTOMY     TOTAL HIP ARTHROPLASTY Right 10/27/2015   Procedure: RIGHT TOTAL HIP ARTHROPLASTY ANTERIOR APPROACH and steroid injection right foot;  Surgeon: Mcarthur Rossetti, MD;  Location: [place];  Service: Orthopedics;  Laterality: Right;   TOTAL HIP ARTHROPLASTY Left 08/13/2019   TOTAL HIP ARTHROPLASTY Left 08/13/2019   Procedure: LEFT TOTAL HIP ARTHROPLASTY ANTERIOR APPROACH;  Surgeon: Mcarthur Rossetti, MD;  Location: [place];  Service: Orthopedics;  Laterality: Left;    There were no vitals filed for this visit.   Subjective Assessment - 05/14/21 0805     Subjective The patient notes continued pain in R temporal region x several years.  He notes tightness and pain in neck.    Patient Stated Goals Reduce pain.    Currently in Pain? Yes    Pain Score 6     Pain Location Head    Pain Orientation Right    Pain Descriptors / Indicators Aching;Radiating    Pain Radiating Towards into R eye/jaw and into arm     Aggravating Factors  daily activities    Pain Relieving Factors Rockne Menghini                [facility] PT Assessment - 05/14/21 8563       Assessment   Medical Diagnosis intractable migraine    Referring Provider (PT) Alonza Bogus    Onset Date/Surgical Date 05/10/21                           [facility] Adult PT Treatment/Exercise - 05/14/21 0823       Exercises   Exercises Neck  Neck Exercises: Machines for Strengthening   UBE (Upper Arm Bike) 2.5 minutes forward and 2 min back level 1      Neck Exercises: Standing   Other Standing Exercises wall lean with scap protraction/retraction    Other Standing Exercises L, and W with scapular retraction x 12 reps with scapular tactile cues      Neck Exercises: Sidelying   Other Sidelying Exercise open book for chest stretch + thoracic self mobilization      Neck Exercises: Prone   Other Prone Exercise scap retraction prone x 10 reps wiht 5 second hold and tactile cues      Neck Exercises: Stretches   Upper Trapezius Stretch Right;Left;2 reps;20 seconds    Other Neck Stretches shoulder circles    Other Neck Stretches seated first rib self mobilization using belt + upper trap stretch      Manual Therapy   Manual Therapy Soft tissue mobilization    Manual therapy comments skilled palpation to assess response to STM and DN    Soft tissue mobilization upper trap, scalenes, suboccipitals              Trigger Point Dry Needling - 05/14/21 0859     Consent Given? Yes    Education Handout Provided Previously provided    Muscles Treated Head and Neck Upper trapezius;Levator scapulae    Upper Trapezius Response Palpable increased muscle length;Twitch reponse elicited    Levator Scapulae Response Palpable increased muscle length;Twitch response elicited                        PT Long Term Goals - 05/12/21 1429       PT LONG TERM GOAL #1   Title Pt will be independent with HEP for improved  flexibility, posture, balance, and decreased pain.  TARGET 06/04/2021    Time 4    Period Weeks    Status New      PT LONG TERM GOAL #2   Title Pt will improve pain to less than or equal to 2/10 for daily activities and funcitonal mobility.    Baseline 5/10 at eval    Time 4    Period Weeks    Status New      PT LONG TERM GOAL #3   Title Pt will improve neck flexibility in lateral flexion by 5 degrees each direction, for improved flexibility and to demonstrate decreased pain with daily activities.    Baseline R lateral flexion 20 degrees, L lateral flexion 10 degrees    Time 4    Period Weeks    Status New      PT LONG TERM GOAL #4   Title Pt will improve balance standing on foam surface, eyes closed, at least 15 seconds for improved vestibular system use for balance.    Baseline 6 sec    Time 4    Period Weeks    Status New                   Plan - 05/14/21 0859     Clinical Impression Statement The patient notes reduction in pain after today's session.  He tolerated dry needling and ther ex emphasizing soft tissue lengthening + scapular stabilization. PT to continue to progress towards LTGs.    PT Treatment/Interventions ADLs/Self Care Home Management;Electrical Stimulation;Traction;Moist Heat;Functional mobility training;Therapeutic activities;Therapeutic exercise;Balance training;Neuromuscular re-education;Manual techniques;Patient/family education;Passive range of motion;Dry needling    PT Next Visit Plan check patient's response to DN  R trap/levator, check HEP, progress scapular stabilization and anterior chest opening.    Consulted and Agree with Plan of Care Patient             Patient will benefit from skilled therapeutic intervention in order to improve the following deficits and impairments:     Visit Diagnosis: Other symptoms and signs involving the musculoskeletal system  Abnormal posture  Chronic intractable headache, unspecified headache  type  Chronic left-sided low back pain without sciatica  Muscle weakness (generalized)     Problem List Patient Active Problem List   Diagnosis Date Noted   Encounter for fitting and adjustment of hearing aid 11/26/2020   Encounter for fitting and adjustment of spectacles and contact lenses 11/26/2020   DNR (do not resuscitate) discussion 11/26/2020   Aortic atherosclerosis ([place]) 11/26/2020   Allergic rhinitis 10/05/2020   Cough 10/05/2020   Sensorineural hearing loss, bilateral 09/29/2020   Hallux rigidus 09/23/2020   Retinoschisis 09/23/2020   RUQ pain 06/10/2020   History of lacunar cerebrovascular accident 04/05/2020   Headache 03/16/2020   Ankle pain 01/22/2020   Hypothyroidism 11/26/2019   Epididymitis 08/28/2019   Unilateral primary osteoarthritis, left hip 08/13/2019   Status post total replacement of left hip 08/13/2019   Abdominal wall hernia 11/02/2018   Progressive angina ([place]) 10/25/2018   DOE (dyspnea on exertion) 10/25/2018   Abscess of right buttock 11/27/2017   Acute sinus infection 11/05/2017   Pilon fracture 03/17/2017   Pilon fracture of left tibia 03/17/2017   At risk for adverse drug reaction 02/28/2017   Closed fracture dislocation of ankle joint, left, with delayed healing, subsequent encounter 02/22/2017   Renal stone 10/26/2016   Gross hematuria 10/26/2016   Bladder neck obstruction 10/26/2016   External hemorrhoid, thrombosed 06/10/2016   Internal hemorrhoids 06/10/2016   Lumbar back pain 05/19/2016   Osteoarthritis of right hip 10/27/2015   Encounter for well adult exam with abnormal findings 10/27/2015   Chronic chest pain 09/22/2015   Acute gouty arthritis 06/11/2015   Coronary artery disease involving native coronary artery with angina pectoris ([place]) 06/11/2015   Chronic diastolic CHF (congestive heart failure) ([place]) 06/11/2015   H/O tissue AVR Oct 2016 06/10/2015   PAF post CABG/AVR- Amiodarone 06/10/2015   Dyslipidemia 06/10/2015    S/P CABG x 4-Oct 2016 03/30/2015   H/O NSTEMI-Oct 2016 03/27/2015   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 ([place])    Depression 09/08/2014   GERD (gastroesophageal reflux disease)    Essential tremor 03/31/2014   Iliotibial band syndrome affecting left lower leg 01/01/2014   Spondylolisthesis at L4-L5 level 01/01/2014   Sinus bradycardia 12/31/2013   Greater trochanteric bursitis of right hip 12/04/2013   OSA on CPAP    Diet-controlled diabetes mellitus ([place]) 07/08/2009   COLONIC POLYPS, HX OF 12/31/2008   GOUT 12/22/2008   Essential hypertension 12/22/2008   Diverticulosis of colon 12/22/2008   NEPHROLITHIASIS, HX OF 12/22/2008    Vaniyah Lansky, PT 05/14/2021, 9:01 AM  [facility] [address] [place], [place], [address] Phone: [phone]   Fax:  [phone]  Name: Troy Banks MRN: [id number] Date of Birth: 23-May-1941

## 2021-05-14 NOTE — Patient Instructions (Signed)
Access Code: [email or web address] URL: [email or web address] Date: 05/14/2021 Prepared by: Rudell Cobb  Exercises Supine Cervical Retraction with Towel - 1-2 x daily - 7 x weekly - 2 sets - 5 reps - 3-5 sec hold Sidelying Thoracic Rotation with Open Book - 2 x daily - 7 x weekly - 1 sets - 10 reps Shoulder External Rotation and Scapular Retraction - 2 x daily - 7 x weekly - 1 sets - 12 reps - 3-5 seconds hold Standing Shoulder W at Wall - 2 x daily - 7 x weekly - 1 sets - 12 reps - 3-5 seconds hold Shoulder Rolls in Sitting - 2 x daily - 7 x weekly - 1 sets - 10 reps

## 2021-05-18 ENCOUNTER — Other Ambulatory Visit: Payer: Self-pay

## 2021-05-18 ENCOUNTER — Ambulatory Visit: Payer: Medicare Other | Admitting: Physical Therapy

## 2021-05-18 ENCOUNTER — Encounter: Payer: Self-pay | Admitting: Physical Therapy

## 2021-05-18 DIAGNOSIS — G43719 Chronic migraine without aura, intractable, without status migrainosus: Secondary | ICD-10-CM | POA: Diagnosis not present

## 2021-05-18 DIAGNOSIS — R29898 Other symptoms and signs involving the musculoskeletal system: Secondary | ICD-10-CM | POA: Diagnosis not present

## 2021-05-18 DIAGNOSIS — M545 Low back pain, unspecified: Secondary | ICD-10-CM | POA: Diagnosis not present

## 2021-05-18 DIAGNOSIS — R519 Headache, unspecified: Secondary | ICD-10-CM | POA: Diagnosis not present

## 2021-05-18 DIAGNOSIS — G8929 Other chronic pain: Secondary | ICD-10-CM | POA: Diagnosis not present

## 2021-05-18 DIAGNOSIS — M6281 Muscle weakness (generalized): Secondary | ICD-10-CM

## 2021-05-18 DIAGNOSIS — R293 Abnormal posture: Secondary | ICD-10-CM | POA: Diagnosis not present

## 2021-05-18 NOTE — Therapy (Signed)
[facility] [address], [place], [place], [address] Phone: [phone]   Fax:  [phone]  Physical Therapy Treatment  Patient Details  Name: Troy Banks MRN: [id number] Date of Birth: 1941-04-13 Referring Provider (PT): [name], Josephina Shih Date: 05/18/2021   PT End of Session - 05/18/21 0937     Visit Number 3    Number of Visits 9    Date for PT Re-Evaluation 06/04/21    Authorization Type [employer] Medicare    PT Start Time 0845    PT Stop Time 0932    PT Time Calculation (min) 47 min    Activity Tolerance Patient tolerated treatment well;No increased pain    Behavior During Therapy [facility] for tasks assessed/performed             Past Medical History:  Diagnosis Date   Allergy    Anxiety    PTSD   Aortic stenosis, moderate 03/27/2015   post AVR echo Echo 11/16: Mild LVH, EF 55-60%, normal wall motion, grade 1 diastolic dysfunction, bioprosthetic AVR okay (mean gradient 18 mmHg) with trivial AI, mild LAE, atrial septal aneurysm, small effusion    Arthritis    "qwhere" (10/29/2015)   Cancer ([place])    skin cancer   Carotid artery stenosis    Carotid US 11/17: 1-39% bilateral ICA stenosis. F/u prn   Cervical stenosis of spinal canal    fusion 2006   CHF (congestive heart failure) ([place])    "secondry to O[facility]"   Closed left pilon fracture    COLONIC POLYPS, HX OF    Complication of anesthesia    "takes a lot to put him under and has woken up during surgery" Difficult to wake up . PTSD; "does not metabolize RX well"that he gets "violent", per pt.    Coronary artery disease    DIVERTICULOSIS, COLON    Dysrhythmia    2 bouts of afib post op and at home but corrected with medication.   Familial tremor 03/19/2014   Family history of adverse reaction to anesthesia    Father - hold to get asleep   GERD (gastroesophageal reflux disease)    uses Zantac   GOUT    Headache    Heart murmur    had aortic value  replacement   High cholesterol    History of blood transfusion    History of kidney stones    surgery   HYPERTENSION    off ACEI 2010 because of hyperkalemia in setting of ARI   Hypothyroidism    newly diaganosed 09/2015   Myocardial infarction 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)    confirmed heterozygous 10/24/14 labs   PTSD (post-traumatic stress disorder)    PTSD (post-traumatic stress disorder)    Sleep apnea    On a CPAP    Past Surgical History:  Procedure Laterality Date   ANKLE FUSION Left 03/17/2017   Procedure: FUSION  PILON FRACTURE WITH BONE GRAFTING;  Surgeon: Shona Needles, MD;  Location: [place];  Service: Orthopedics;  Laterality: Left;   ANTERIOR FUSION CERVICAL SPINE  2002   AORTIC VALVE REPLACEMENT N/A 03/30/2015   Procedure: AORTIC VALVE REPLACEMENT (AVR);  Surgeon: Grace Isaac, MD;  Location: [place];  Service: Open Heart Surgery;  Laterality: N/A;   CARDIAC CATHETERIZATION N/A 03/26/2015  Procedure: Left Heart Cath and Coronary Angiography;  Surgeon: Leonie Man, MD;  Location: [place] [facility];  Service: Cardiovascular;  Laterality: N/A;   CARDIAC VALVE REPLACEMENT     COLONOSCOPY W/ POLYPECTOMY     CORONARY ARTERY BYPASS GRAFT N/A 03/30/2015   Procedure: CORONARY ARTERY BYPASS GRAFTING (CABG) x four, using left internal mammary artery and left    leg greater saphenous vein harvested endoscopically ;  Surgeon: Grace Isaac, MD;  Location: [place];  Service: Open Heart Surgery;  Laterality: N/A;   CYSTOSCOPY/URETEROSCOPY/HOLMIUM LASER/STENT PLACEMENT Bilateral 11/07/2016   Procedure: CYSTOSCOPY/URETEROSCOPY/HOLMIUM LASER/STENT PLACEMENT;  Surgeon: Nickie Retort, MD;  Location: [facility];  Service: Urology;  Laterality: Bilateral;   CYSTOSCOPY/URETEROSCOPY/HOLMIUM LASER/STENT PLACEMENT Bilateral 11/23/2016   Procedure: CYSTOSCOPY/BILATERAL URETEROSCOPY/HOLMIUM  LASER/STENT EXCHANGE;  Surgeon: Nickie Retort, MD;  Location: [facility];  Service: Urology;  Laterality: Bilateral;  NEEDS 90 MIN    EXTERNAL FIXATION LEG Left 02/22/2017   Procedure: EXTERNAL FIXATION LEFT ANKLE;  Surgeon: Renette Butters, MD;  Location: [place];  Service: Orthopedics;  Laterality: Left;   HEMORRHOID BANDING  1970s   INGUINAL HERNIA REPAIR Left [place]   "cut me open"   LEFT HEART CATH AND CORS/GRAFTS ANGIOGRAPHY N/A 10/25/2018   Procedure: LEFT HEART CATH AND CORS/GRAFTS ANGIOGRAPHY;  Surgeon: Leonie Man, MD;  Location: [facility];  Service: Cardiovascular;  Laterality: N/A;   POSTERIOR FUSION CERVICAL SPINE  2006   TEE WITHOUT CARDIOVERSION N/A 03/30/2015   Procedure: TRANSESOPHAGEAL ECHOCARDIOGRAM (TEE);  Surgeon: Grace Isaac, MD;  Location: [place];  Service: Open Heart Surgery;  Laterality: N/A;   TESTICLE TORSION REDUCTION Right 1960   TOE FUSION Left 1985 & 2004   "great toe" and removal   TONSILLECTOMY     TOTAL HIP ARTHROPLASTY Right 10/27/2015   Procedure: RIGHT TOTAL HIP ARTHROPLASTY ANTERIOR APPROACH and steroid injection right foot;  Surgeon: Mcarthur Rossetti, MD;  Location: [place] [facility];  Service: Orthopedics;  Laterality: Right;   TOTAL HIP ARTHROPLASTY Left 08/13/2019   TOTAL HIP ARTHROPLASTY Left 08/13/2019   Procedure: LEFT TOTAL HIP ARTHROPLASTY ANTERIOR APPROACH;  Surgeon: Mcarthur Rossetti, MD;  Location: [place];  Service: Orthopedics;  Laterality: Left;    There were no vitals filed for this visit.   Subjective Assessment - 05/18/21 0848     Subjective Had some soreness from last visit.  Very little change from last visit.  My head feels like it's going to come off; my neck and shoulders are like "banjo strings."    Patient Stated Goals Reduce pain.    Currently in Pain? Yes    Pain Score 7     Pain Location Head    Pain Orientation Right    Pain Descriptors / Indicators Aching;Radiating    Pain  Type Chronic pain    Pain Frequency Constant    Aggravating Factors  daily activities    Pain Relieving Factors trying to relax    Multiple Pain Sites Yes    Pain Score 7    Pain Location Shoulder   neck and shoulders   Pain Orientation Right;Left    Pain Descriptors / Indicators Tightness    Pain Type Chronic pain    Pain Onset More than a month ago    Pain Frequency Constant    Aggravating Factors  unsure    Pain Relieving Factors unsure  [place] Adult PT Treatment/Exercise - 05/18/21 0001       Neck Exercises: Machines for Strengthening   UBE (Upper Arm Bike) 2.5 minutes forward and 2.5 min back level 1, standing      Neck Exercises: Theraband   Scapula Retraction 10 reps;Red    Shoulder Extension 10 reps;Red    Horizontal ABduction 10 reps;Red    Other Theraband Exercises Cues for technique throughout theraband exercises.      Neck Exercises: Standing   Neck Retraction 10 reps;3 secs    Neck Retraction Limitations PT holding towel behind neck; cues for chin tuck      Neck Exercises: Stretches   Upper Trapezius Stretch Right;Left;2 reps;20 seconds    Upper Trapezius Stretch Limitations seated at edge of mat    Corner Stretch 5 reps;10 seconds    Corner Stretch Limitations pec stretch, performed in doorway              Access Code: [email or web address] URL: [email or web address] Date: 05/18/2021 Prepared by: [facility]  Reviewed exercises below, with pt return demo understanding, with minimal cues for correct technique  Supine Cervical Retraction with Towel - 1-2 x daily - 7 x weekly - 2 sets - 5 reps - 3-5 sec hold  Sidelying Thoracic Rotation with Open Book - 2 x daily - 7 x weekly - 1 sets - 10 reps Shoulder External Rotation and Scapular Retraction - 2 x daily - 7 x weekly - 1 sets - 12 reps - 3-5 seconds hold Standing Shoulder W at Wall - 2 x daily - 7 x weekly - 1 sets  - 12 reps - 3-5 seconds hold Shoulder Rolls in Sitting - 2 x daily - 7 x weekly - 1 sets - 10 reps       PT Education - 05/18/21 0937     Education Details Reviewed HEP and added to HEP-see instructions    Person(s) Educated Patient    Methods Explanation;Handout;Demonstration    Comprehension Verbalized understanding;Returned demonstration                 PT Long Term Goals - 05/12/21 1429       PT LONG TERM GOAL #1   Title Pt will be independent with HEP for improved flexibility, posture, balance, and decreased pain.  TARGET 06/04/2021    Time 4    Period Weeks    Status New      PT LONG TERM GOAL #2   Title Pt will improve pain to less than or equal to 2/10 for daily activities and funcitonal mobility.    Baseline 5/10 at eval    Time 4    Period Weeks    Status New      PT LONG TERM GOAL #3   Title Pt will improve neck flexibility in lateral flexion by 5 degrees each direction, for improved flexibility and to demonstrate decreased pain with daily activities.    Baseline R lateral flexion 20 degrees, L lateral flexion 10 degrees    Time 4    Period Weeks    Status New      PT LONG TERM GOAL #4   Title Pt will improve balance standing on foam surface, eyes closed, at least 15 seconds for improved vestibular system use for balance.    Baseline 6 sec    Time 4    Period Weeks    Status New  Plan - 05/18/21 0938     Clinical Impression Statement Pt reports pain "like a banjo string" in shoulder and neck muscles at beginning of session; no increase in pain and pt reports slightly decreased pain at end of session.  Educated patient throughout on posture, benefits of postural stretching, strengthening, and awareness of posture, taking "posture breaks" through the day.  Pt was able to begin theraband postural strenghtening today.  He is unsure if he felt benefit from dry needling, but is willing to try again next session.  He will continue  to benefit from skilled PT towards LTGs for improved overall functional mobility and decreased pain.    PT Treatment/Interventions ADLs/Self Care Home Management;Electrical Stimulation;Traction;Moist Heat;Functional mobility training;Therapeutic activities;Therapeutic exercise;Balance training;Neuromuscular re-education;Manual techniques;Patient/family education;Passive range of motion;Dry needling    PT Next Visit Plan try again DN R trap/levator, check HEP progression and continue to progress scapular stabilization and anterior chest opening, chest/ upper trap stretches    Consulted and Agree with Plan of Care Patient             Patient will benefit from skilled therapeutic intervention in order to improve the following deficits and impairments:  Pain, Difficulty walking, Impaired flexibility, Postural dysfunction, Decreased balance, Decreased mobility  Visit Diagnosis: Abnormal posture  Muscle weakness (generalized)  Other symptoms and signs involving the musculoskeletal system     Problem List Patient Active Problem List   Diagnosis Date Noted   Encounter for fitting and adjustment of hearing aid 11/26/2020   Encounter for fitting and adjustment of spectacles and contact lenses 11/26/2020   DNR (do not resuscitate) discussion 11/26/2020   Aortic atherosclerosis ([place]) 11/26/2020   Allergic rhinitis 10/05/2020   Cough 10/05/2020   Sensorineural hearing loss, bilateral 09/29/2020   Hallux rigidus 09/23/2020   Retinoschisis 09/23/2020   RUQ pain 06/10/2020   History of lacunar cerebrovascular accident 04/05/2020   Headache 03/16/2020   Ankle pain 01/22/2020   Hypothyroidism 11/26/2019   Epididymitis 08/28/2019   Unilateral primary osteoarthritis, left hip 08/13/2019   Status post total replacement of left hip 08/13/2019   Abdominal wall hernia 11/02/2018   Progressive angina ([place]) 10/25/2018   DOE (dyspnea on exertion) 10/25/2018   Abscess of right buttock 11/27/2017    Acute sinus infection 11/05/2017   Pilon fracture 03/17/2017   Pilon fracture of left tibia 03/17/2017   At risk for adverse drug reaction 02/28/2017   Closed fracture dislocation of ankle joint, left, with delayed healing, subsequent encounter 02/22/2017   Renal stone 10/26/2016   Gross hematuria 10/26/2016   Bladder neck obstruction 10/26/2016   External hemorrhoid, thrombosed 06/10/2016   Internal hemorrhoids 06/10/2016   Lumbar back pain 05/19/2016   Osteoarthritis of right hip 10/27/2015   Encounter for well adult exam with abnormal findings 10/27/2015   Chronic chest pain 09/22/2015   Acute gouty arthritis 06/11/2015   Coronary artery disease involving native coronary artery with angina pectoris ([place]) 06/11/2015   Chronic diastolic CHF (congestive heart failure) ([place]) 06/11/2015   H/O tissue AVR Oct 2016 06/10/2015   PAF post CABG/AVR- Amiodarone 06/10/2015   Dyslipidemia 06/10/2015   S/P CABG x 4-Oct 2016 03/30/2015   H/O NSTEMI-Oct 2016 03/27/2015   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 ([place])    Depression 09/08/2014   GERD (gastroesophageal reflux disease)    Essential tremor 03/31/2014   Iliotibial band syndrome affecting left lower leg 01/01/2014   Spondylolisthesis at L4-L5 level 01/01/2014   Sinus bradycardia 12/31/2013  Greater trochanteric bursitis of right hip 12/04/2013   OSA on CPAP    Diet-controlled diabetes mellitus ([place]) 07/08/2009   COLONIC POLYPS, HX OF 12/31/2008   GOUT 12/22/2008   Essential hypertension 12/22/2008   Diverticulosis of colon 12/22/2008   NEPHROLITHIASIS, HX OF 12/22/2008    Deshane Cotroneo W., PT 05/18/2021, 9:41 AM  [facility] [address] [place], [place], [address] Phone: [phone]   Fax:  [phone]  Name: Troy Banks MRN: [id number] Date of Birth: [birth year]

## 2021-05-18 NOTE — Patient Instructions (Addendum)
Access Code: [email or web address] URL: [email or web address] Date: 05/18/2021 Prepared by: [facility]  Exercises Supine Cervical Retraction with Towel - 1-2 x daily - 7 x weekly - 2 sets - 5 reps - 3-5 sec hold Sidelying Thoracic Rotation with Open Book - 2 x daily - 7 x weekly - 1 sets - 10 reps Shoulder External Rotation and Scapular Retraction - 2 x daily - 7 x weekly - 1 sets - 12 reps - 3-5 seconds hold Standing Shoulder W at Wall - 2 x daily - 7 x weekly - 1 sets - 12 reps - 3-5 seconds hold Shoulder Rolls in Sitting - 2 x daily - 7 x weekly - 1 sets - 10 reps  Added 05/18/2021 Standing Isometric Cervical Retraction with Chin Tucks and Ball at [employer] - 1-2 x daily - 5 x weekly - 1 sets - 10 reps - 3-5 hold

## 2021-05-21 ENCOUNTER — Ambulatory Visit: Payer: Medicare Other | Admitting: Rehabilitative and Restorative Service Providers"

## 2021-05-21 ENCOUNTER — Other Ambulatory Visit: Payer: Self-pay

## 2021-05-21 ENCOUNTER — Encounter: Payer: Self-pay | Admitting: Rehabilitative and Restorative Service Providers"

## 2021-05-21 DIAGNOSIS — R29898 Other symptoms and signs involving the musculoskeletal system: Secondary | ICD-10-CM

## 2021-05-21 DIAGNOSIS — M6281 Muscle weakness (generalized): Secondary | ICD-10-CM

## 2021-05-21 DIAGNOSIS — R293 Abnormal posture: Secondary | ICD-10-CM | POA: Diagnosis not present

## 2021-05-21 DIAGNOSIS — M545 Low back pain, unspecified: Secondary | ICD-10-CM | POA: Diagnosis not present

## 2021-05-21 DIAGNOSIS — G8929 Other chronic pain: Secondary | ICD-10-CM

## 2021-05-21 DIAGNOSIS — G43719 Chronic migraine without aura, intractable, without status migrainosus: Secondary | ICD-10-CM | POA: Diagnosis not present

## 2021-05-21 DIAGNOSIS — R519 Headache, unspecified: Secondary | ICD-10-CM | POA: Diagnosis not present

## 2021-05-21 NOTE — Therapy (Signed)
[facility] [address] [place], [place], [address] Phone: [phone]   Fax:  [phone]  Physical Therapy Treatment  Patient Details  Name: Troy Banks MRN: [id number] Date of Birth: 1940-08-14 Referring Provider (PT): [name], Josephina Shih Date: 05/21/2021   PT End of Session - 05/21/21 1112     Visit Number 4    Number of Visits 9    Date for PT Re-Evaluation 06/04/21    Authorization Type [employer] Medicare    PT Start Time 0910    PT Stop Time 0943    PT Time Calculation (min) 33 min    Activity Tolerance Patient tolerated treatment well;No increased pain    Behavior During Therapy [facility] for tasks assessed/performed             Past Medical History:  Diagnosis Date   Allergy    Anxiety    PTSD   Aortic stenosis, moderate 03/27/2015   post AVR echo Echo 11/16: Mild LVH, EF 55-60%, normal wall motion, grade 1 diastolic dysfunction, bioprosthetic AVR okay (mean gradient 18 mmHg) with trivial AI, mild LAE, atrial septal aneurysm, small effusion    Arthritis    "qwhere" (10/29/2015)   Cancer ([place])    skin cancer   Carotid artery stenosis    Carotid US 11/17: 1-39% bilateral ICA stenosis. F/u prn   Cervical stenosis of spinal canal    fusion 2006   CHF (congestive heart failure) ([place])    "secondry to OHS"   Closed left pilon fracture    COLONIC POLYPS, HX OF    Complication of anesthesia    "takes a lot to put him under and has woken up during surgery" Difficult to wake up . PTSD; "does not metabolize RX well"that he gets "violent", per pt.    Coronary artery disease    DIVERTICULOSIS, COLON    Dysrhythmia    2 bouts of afib post op and at home but corrected with medication.   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 ([place])    confirmed heterozygous 10/24/14 labs   PTSD (post-traumatic stress disorder)    PTSD (post-traumatic stress disorder)    Sleep apnea    On a CPAP    Past Surgical History:  Procedure Laterality Date   ANKLE FUSION Left 03/17/2017   Procedure: FUSION  PILON FRACTURE WITH BONE GRAFTING;  Surgeon: Shona Needles, MD;  Location: [place];  Service: Orthopedics;  Laterality: Left;   ANTERIOR FUSION CERVICAL SPINE  2002   AORTIC VALVE REPLACEMENT N/A 03/30/2015   Procedure: AORTIC VALVE REPLACEMENT (AVR);  Surgeon: Grace Isaac, MD;  Location: [place];  Service: Open Heart Surgery;  Laterality: N/A;   CARDIAC CATHETERIZATION N/A 03/26/2015  Procedure: Left Heart Cath and Coronary Angiography;  Surgeon: Leonie Man, MD;  Location: [place] [facility];  Service: Cardiovascular;  Laterality: N/A;   CARDIAC VALVE REPLACEMENT     COLONOSCOPY W/ POLYPECTOMY     CORONARY ARTERY BYPASS GRAFT N/A 03/30/2015   Procedure: CORONARY ARTERY BYPASS GRAFTING (CABG) x four, using left internal mammary artery and left    leg greater saphenous vein harvested endoscopically ;  Surgeon: Grace Isaac, MD;  Location: [place];  Service: Open Heart Surgery;  Laterality: N/A;   CYSTOSCOPY/URETEROSCOPY/HOLMIUM LASER/STENT PLACEMENT Bilateral 11/07/2016   Procedure: CYSTOSCOPY/URETEROSCOPY/HOLMIUM LASER/STENT PLACEMENT;  Surgeon: Nickie Retort, MD;  Location: [facility];  Service: Urology;  Laterality: Bilateral;   CYSTOSCOPY/URETEROSCOPY/HOLMIUM LASER/STENT PLACEMENT Bilateral 11/23/2016   Procedure: CYSTOSCOPY/BILATERAL URETEROSCOPY/HOLMIUM  LASER/STENT EXCHANGE;  Surgeon: Nickie Retort, MD;  Location: [facility];  Service: Urology;  Laterality: Bilateral;  NEEDS 90 MIN    EXTERNAL FIXATION LEG Left 02/22/2017   Procedure: EXTERNAL FIXATION LEFT ANKLE;  Surgeon: Renette Butters, MD;  Location: [place];  Service: Orthopedics;  Laterality: Left;   HEMORRHOID BANDING  1970s   INGUINAL HERNIA REPAIR Left [place]   "cut me open"   LEFT HEART CATH AND CORS/GRAFTS ANGIOGRAPHY N/A 10/25/2018   Procedure: LEFT HEART CATH AND CORS/GRAFTS ANGIOGRAPHY;  Surgeon: Leonie Man, MD;  Location: [place] [facility];  Service: Cardiovascular;  Laterality: N/A;   POSTERIOR FUSION CERVICAL SPINE  2006   TEE WITHOUT CARDIOVERSION N/A 03/30/2015   Procedure: TRANSESOPHAGEAL ECHOCARDIOGRAM (TEE);  Surgeon: Grace Isaac, MD;  Location: [place];  Service: Open Heart Surgery;  Laterality: N/A;   TESTICLE TORSION REDUCTION Right 1960   TOE FUSION Left 1985 & 2004   "great toe" and removal   TONSILLECTOMY     TOTAL HIP ARTHROPLASTY Right 10/27/2015   Procedure: RIGHT TOTAL HIP ARTHROPLASTY ANTERIOR APPROACH and steroid injection right foot;  Surgeon: Mcarthur Rossetti, MD;  Location: [place];  Service: Orthopedics;  Laterality: Right;   TOTAL HIP ARTHROPLASTY Left 08/13/2019   TOTAL HIP ARTHROPLASTY Left 08/13/2019   Procedure: LEFT TOTAL HIP ARTHROPLASTY ANTERIOR APPROACH;  Surgeon: Mcarthur Rossetti, MD;  Location: [place];  Service: Orthopedics;  Laterality: Left;    There were no vitals filed for this visit.   Subjective Assessment - 05/21/21 0912     Subjective The patient notes he is feeling better through his shoulders.  "I think the shoulders will take care of the neck if we get them right."  He continues with migraines and had one all day Wednesday.  He gets a "hangover" sensation after the migraine noting it takes 3 days to clear the system.  *the patient arrives late to appointment and had gone to another  clinic (despite being treated here 3 prior sessions).    Diagnostic tests Has had CT scan and MRI-cleared.  Eye exam at [facility] in [place]    Patient Stated Goals Reduce pain.    Currently in Pain? Yes    Pain Score 5     Pain Location Head    Pain Orientation Right    Pain Descriptors / Indicators Headache;Shooting   temporal headache.   Pain Type Chronic pain    Pain Radiating Towards "shooting pain from the shoulder"    Pain Frequency Constant    Aggravating Factors  daily activities    Pain Relieving Factors trying to relax  [facility] PT Assessment - 05/21/21 0929       Assessment   Medical Diagnosis intractable migraine    Referring Provider (PT) [name], Wells Guiles    Onset Date/Surgical Date 05/10/21                           [facility] Adult PT Treatment/Exercise - 05/21/21 0929       Self-Care   Self-Care Other Self-Care Comments    Other Self-Care Comments  PT and patient discussed continued performance of current HEP.      Exercises   Exercises Neck      Neck Exercises: Machines for Strengthening   UBE (Upper Arm Bike) 3 minutes forward for warm up      Neck Exercises: Theraband   Scapula Retraction 10 reps;Green    Shoulder Extension 10 reps;Green    Other Theraband Exercises bow and arrow attempted for scap retraction, however patient had difficulty following tactile and demo cues    Other Theraband Exercises Patient needs cues for scapular depression      Manual Therapy   Manual Therapy Soft tissue mobilization    Manual therapy comments skilled palpation to assess response to STM and DN    Soft tissue mobilization upper trap, scalenes, suboccipitals              Trigger Point Dry Needling - 05/21/21 1115     Consent Given? Yes    Education Handout Provided Previously provided    Muscles Treated Head and Neck Upper trapezius;Levator scapulae    Upper Trapezius Response Twitch reponse elicited;Palpable increased muscle  length    Levator Scapulae Response Twitch response elicited;Palpable increased muscle length                        PT Long Term Goals - 05/12/21 1429       PT LONG TERM GOAL #1   Title Pt will be independent with HEP for improved flexibility, posture, balance, and decreased pain.  TARGET 06/04/2021    Time 4    Period Weeks    Status New      PT LONG TERM GOAL #2   Title Pt will improve pain to less than or equal to 2/10 for daily activities and funcitonal mobility.    Baseline 5/10 at eval    Time 4    Period Weeks    Status New      PT LONG TERM GOAL #3   Title Pt will improve neck flexibility in lateral flexion by 5 degrees each direction, for improved flexibility and to demonstrate decreased pain with daily activities.    Baseline R lateral flexion 20 degrees, L lateral flexion 10 degrees    Time 4    Period Weeks    Status New      PT LONG TERM GOAL #4   Title Pt will improve balance standing on foam surface, eyes closed, at least 15 seconds for improved vestibular system use for balance.    Baseline 6 sec    Time 4    Period Weeks    Status New                   Plan - 05/21/21 1117     Clinical Impression Statement The patient and PT discussed ongoing chronic pain and intermittent migraines.  PT inquired about memory and depression due to patient having a hard time finding the clinic today  and a statement he made about not having purpose since retiring.  When PT and patient talked about depression, he states "itis 90% of my problem".  He also reported a lack of home support from his significant other of 27 years.  He has a son in [place] that he has considered moving in with to try to find a better situation.  Per the patient's report, he has declined the use of anti-depressants in the past.  He is uncertain about seeking counseling.  PT recommended patient speak with primary care MD at his upcoming physical on 1/3/223 and he agreed that this  information could be conveyed to his provider ahead of  that visit.    PT Treatment/Interventions ADLs/Self Care Home Management;Electrical Stimulation;Traction;Moist Heat;Functional mobility training;Therapeutic activities;Therapeutic exercise;Balance training;Neuromuscular re-education;Manual techniques;Patient/family education;Passive range of motion;Dry needling    PT Next Visit Plan check HEP progression and continue to progress scapular stabilization and anterior chest opening, chest/ upper trap stretches    Consulted and Agree with Plan of Care Patient             Patient will benefit from skilled therapeutic intervention in order to improve the following deficits and impairments:     Visit Diagnosis: Abnormal posture  Muscle weakness (generalized)  Other symptoms and signs involving the musculoskeletal system  Chronic intractable headache, unspecified headache type     Problem List Patient Active Problem List   Diagnosis Date Noted   Encounter for fitting and adjustment of hearing aid 11/26/2020   Encounter for fitting and adjustment of spectacles and contact lenses 11/26/2020   DNR (do not resuscitate) discussion 11/26/2020   Aortic atherosclerosis ([place]) 11/26/2020   Allergic rhinitis 10/05/2020   Cough 10/05/2020   Sensorineural hearing loss, bilateral 09/29/2020   Hallux rigidus 09/23/2020   Retinoschisis 09/23/2020   RUQ pain 06/10/2020   History of lacunar cerebrovascular accident 04/05/2020   Headache 03/16/2020   Ankle pain 01/22/2020   Hypothyroidism 11/26/2019   Epididymitis 08/28/2019   Unilateral primary osteoarthritis, left hip 08/13/2019   Status post total replacement of left hip 08/13/2019   Abdominal wall hernia 11/02/2018   Progressive angina ([place]) 10/25/2018   DOE (dyspnea on exertion) 10/25/2018   Abscess of right buttock 11/27/2017   Acute sinus infection 11/05/2017   Pilon fracture 03/17/2017   Pilon fracture of left tibia 03/17/2017    At risk for adverse drug reaction 02/28/2017   Closed fracture dislocation of ankle joint, left, with delayed healing, subsequent encounter 02/22/2017   Renal stone 10/26/2016   Gross hematuria 10/26/2016   Bladder neck obstruction 10/26/2016   External hemorrhoid, thrombosed 06/10/2016   Internal hemorrhoids 06/10/2016   Lumbar back pain 05/19/2016   Osteoarthritis of right hip 10/27/2015   Encounter for well adult exam with abnormal findings 10/27/2015   Chronic chest pain 09/22/2015   Acute gouty arthritis 06/11/2015   Coronary artery disease involving native coronary artery with angina pectoris ([place]) 06/11/2015   Chronic diastolic CHF (congestive heart failure) ([place]) 06/11/2015   H/O tissue AVR Oct 2016 06/10/2015   PAF post CABG/AVR- Amiodarone 06/10/2015   Dyslipidemia 06/10/2015   S/P CABG x 4-Oct 2016 03/30/2015   H/O NSTEMI-Oct 2016 03/27/2015   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 ([place])    Depression 09/08/2014   GERD (gastroesophageal reflux disease)    Essential tremor 03/31/2014   Iliotibial band syndrome affecting left lower leg 01/01/2014   Spondylolisthesis at L4-L5 level 01/01/2014   Sinus bradycardia 12/31/2013  Greater trochanteric bursitis of right hip 12/04/2013   OSA on CPAP    Diet-controlled diabetes mellitus ([place]) 07/08/2009   COLONIC POLYPS, HX OF 12/31/2008   GOUT 12/22/2008   Essential hypertension 12/22/2008   Diverticulosis of colon 12/22/2008   NEPHROLITHIASIS, HX OF 12/22/2008    Josee Speece, PT 05/21/2021, 11:23 AM  [facility] [address] [place], [place], [address] Phone: [phone]   Fax:  [phone]  Name: Troy Banks MRN: [id number] Date of Birth: 01-23-1941

## 2021-05-25 ENCOUNTER — Ambulatory Visit: Payer: Medicare Other | Admitting: Physical Therapy

## 2021-05-25 ENCOUNTER — Other Ambulatory Visit: Payer: Self-pay

## 2021-05-25 ENCOUNTER — Encounter: Payer: Self-pay | Admitting: Physical Therapy

## 2021-05-25 DIAGNOSIS — R519 Headache, unspecified: Secondary | ICD-10-CM | POA: Diagnosis not present

## 2021-05-25 DIAGNOSIS — M6281 Muscle weakness (generalized): Secondary | ICD-10-CM | POA: Diagnosis not present

## 2021-05-25 DIAGNOSIS — R29898 Other symptoms and signs involving the musculoskeletal system: Secondary | ICD-10-CM | POA: Diagnosis not present

## 2021-05-25 DIAGNOSIS — G43719 Chronic migraine without aura, intractable, without status migrainosus: Secondary | ICD-10-CM | POA: Diagnosis not present

## 2021-05-25 DIAGNOSIS — R293 Abnormal posture: Secondary | ICD-10-CM | POA: Diagnosis not present

## 2021-05-25 DIAGNOSIS — G8929 Other chronic pain: Secondary | ICD-10-CM | POA: Diagnosis not present

## 2021-05-25 DIAGNOSIS — M545 Low back pain, unspecified: Secondary | ICD-10-CM | POA: Diagnosis not present

## 2021-05-25 NOTE — Patient Instructions (Signed)
Access Code: [email or web address] URL: [email or web address] Date: 05/25/2021 Prepared by: [facility]  Exercises Supine Cervical Retraction with Towel - 1-2 x daily - 7 x weekly - 2 sets - 5 reps - 3-5 sec hold Sidelying Thoracic Rotation with Open Book - 2 x daily - 7 x weekly - 1 sets - 10 reps Shoulder External Rotation and Scapular Retraction - 2 x daily - 7 x weekly - 1 sets - 12 reps - 3-5 seconds hold Standing Shoulder W at Wall - 2 x daily - 7 x weekly - 1 sets - 12 reps - 3-5 seconds hold Shoulder Rolls in Sitting - 2 x daily - 7 x weekly - 1 sets - 10 reps Standing Isometric Cervical Retraction with Chin Tucks and Ball at [employer] - 1-2 x daily - 5 x weekly - 1 sets - 10 reps - 3-5 hold  Added to HEP:   Doorway Pec Stretch at 60 Elevation - 1 x daily - 5 x weekly - 1 sets - 5 reps - 15 sec hold

## 2021-05-25 NOTE — Therapy (Signed)
[facility] [address] [place], [place], [address] Phone: [phone]   Fax:  [phone]  Physical Therapy Treatment  Patient Details  Name: Troy Banks MRN: [id number] Date of Birth: 1941/02/13 Referring Provider (PT): [name], Wells Guiles   Encounter Date: 05/25/2021   PT End of Session - 05/25/21 1013     Visit Number 5    Number of Visits 9    Date for PT Re-Evaluation 06/04/21    Authorization Type [employer] Medicare    PT Start Time 0935    PT Stop Time 0973    PT Time Calculation (min) 39 min    Activity Tolerance Patient tolerated treatment well;No increased pain    Behavior During Therapy [facility] for tasks assessed/performed             Past Medical History:  Diagnosis Date   Allergy    Anxiety    PTSD   Aortic stenosis, moderate 03/27/2015   post AVR echo Echo 11/16: Mild LVH, EF 55-60%, normal wall motion, grade 1 diastolic dysfunction, bioprosthetic AVR okay (mean gradient 18 mmHg) with trivial AI, mild LAE, atrial septal aneurysm, small effusion    Arthritis    "qwhere" (10/29/2015)   Cancer ([place])    skin cancer   Carotid artery stenosis    Carotid US 11/17: 1-39% bilateral ICA stenosis. F/u prn   Cervical stenosis of spinal canal    fusion 2006   CHF (congestive heart failure) ([place])    "secondry to [facility]"   Closed left pilon fracture    COLONIC POLYPS, HX OF    Complication of anesthesia    "takes a lot to put him under and has woken up during surgery" Difficult to wake up . PTSD; "does not metabolize RX well"that he gets "violent", per pt.    Coronary artery disease    DIVERTICULOSIS, COLON    Dysrhythmia    2 bouts of afib post op and at home but corrected with medication.   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 ([place])    confirmed heterozygous 10/24/14 labs   PTSD (post-traumatic stress disorder)    PTSD (post-traumatic stress disorder)    Sleep apnea    On a CPAP    Past Surgical History:  Procedure Laterality Date   ANKLE FUSION Left 03/17/2017   Procedure: FUSION  PILON FRACTURE WITH BONE GRAFTING;  Surgeon: Shona Needles, MD;  Location: [place];  Service: Orthopedics;  Laterality: Left;   ANTERIOR FUSION CERVICAL SPINE  2002   AORTIC VALVE REPLACEMENT N/A 03/30/2015   Procedure: AORTIC VALVE REPLACEMENT (AVR);  Surgeon: Grace Isaac, MD;  Location: [place];  Service: Open Heart Surgery;  Laterality: N/A;   CARDIAC CATHETERIZATION N/A 03/26/2015  Procedure: Left Heart Cath and Coronary Angiography;  Surgeon: Leonie Man, MD;  Location: [place] [facility];  Service: Cardiovascular;  Laterality: N/A;   CARDIAC VALVE REPLACEMENT     COLONOSCOPY W/ POLYPECTOMY     CORONARY ARTERY BYPASS GRAFT N/A 03/30/2015   Procedure: CORONARY ARTERY BYPASS GRAFTING (CABG) x four, using left internal mammary artery and left    leg greater saphenous vein harvested endoscopically ;  Surgeon: Grace Isaac, MD;  Location: [place];  Service: Open Heart Surgery;  Laterality: N/A;   CYSTOSCOPY/URETEROSCOPY/HOLMIUM LASER/STENT PLACEMENT Bilateral 11/07/2016   Procedure: CYSTOSCOPY/URETEROSCOPY/HOLMIUM LASER/STENT PLACEMENT;  Surgeon: Nickie Retort, MD;  Location: [facility];  Service: Urology;  Laterality: Bilateral;   CYSTOSCOPY/URETEROSCOPY/HOLMIUM LASER/STENT PLACEMENT Bilateral 11/23/2016   Procedure: CYSTOSCOPY/BILATERAL URETEROSCOPY/HOLMIUM  LASER/STENT EXCHANGE;  Surgeon: Nickie Retort, MD;  Location: [facility];  Service: Urology;  Laterality: Bilateral;  NEEDS 90 MIN    EXTERNAL FIXATION LEG Left 02/22/2017   Procedure: EXTERNAL FIXATION LEFT ANKLE;  Surgeon: Renette Butters, MD;  Location: [place];  Service: Orthopedics;  Laterality: Left;   HEMORRHOID BANDING  1970s   INGUINAL HERNIA REPAIR Left [place]   "cut me open"   LEFT HEART CATH AND CORS/GRAFTS ANGIOGRAPHY N/A 10/25/2018   Procedure: LEFT HEART CATH AND CORS/GRAFTS ANGIOGRAPHY;  Surgeon: Leonie Man, MD;  Location: [place] [facility];  Service: Cardiovascular;  Laterality: N/A;   POSTERIOR FUSION CERVICAL SPINE  2006   TEE WITHOUT CARDIOVERSION N/A 03/30/2015   Procedure: TRANSESOPHAGEAL ECHOCARDIOGRAM (TEE);  Surgeon: Grace Isaac, MD;  Location: [place];  Service: Open Heart Surgery;  Laterality: N/A;   TESTICLE TORSION REDUCTION Right 1960   TOE FUSION Left 1985 & 2004   "great toe" and removal   TONSILLECTOMY     TOTAL HIP ARTHROPLASTY Right 10/27/2015   Procedure: RIGHT TOTAL HIP ARTHROPLASTY ANTERIOR APPROACH and steroid injection right foot;  Surgeon: Mcarthur Rossetti, MD;  Location: [facility] [place];  Service: Orthopedics;  Laterality: Right;   TOTAL HIP ARTHROPLASTY Left 08/13/2019   TOTAL HIP ARTHROPLASTY Left 08/13/2019   Procedure: LEFT TOTAL HIP ARTHROPLASTY ANTERIOR APPROACH;  Surgeon: Mcarthur Rossetti, MD;  Location: [place] [facility];  Service: Orthopedics;  Laterality: Left;    There were no vitals filed for this visit.   Subjective Assessment - 05/25/21 0938     Subjective Pt reports its a good day.  My shoulders and neck are a lot looser.  Have not had a migraine since I last saw you.  Feel like the dry needling is helping.    Diagnostic tests Has had CT scan and MRI-cleared.  Eye exam at [facility] in [place]    Patient Stated Goals Reduce pain.    Currently in Pain? Yes    Pain Score 4     Pain Location Neck   and  shoulders   Pain Orientation Right    Pain Descriptors / Indicators Headache;Shooting    Pain Type Chronic pain    Pain Frequency Constant    Aggravating Factors  daily activities    Pain Relieving Factors trying to relax; feels the dry needling and stretches are helping.                               [facility] Adult PT Treatment/Exercise - 05/25/21 0001       Neck Exercises: Machines for Strengthening   UBE (  Upper Arm Bike) 3 minutes forward for warm up, Level 1, standing      Neck Exercises: Theraband   Scapula Retraction 10 reps;Green   2 sets   Shoulder Extension 10 reps;Green   2 sets     Neck Exercises: Seated   Other Seated Exercise Shoulder rolls forward/back x 10 reps      Neck Exercises: Stretches   Upper Trapezius Stretch Right;Left;2 reps;20 seconds    Upper Trapezius Stretch Limitations seated at edge of mat    Corner Stretch 5 reps;10 seconds    Corner Stretch Limitations pec stretch, performed in doorway      Manual Therapy   Manual Therapy Soft tissue mobilization    Manual therapy comments manual therapy to address suboccipital, upper trap stretching    Soft tissue mobilization upper trap, scalenes, suboccipitals for decreased tightness, improved flexibility, and decreased pain.              Reviewed standing chin retraction, x 10 reps, with cues for technique.       PT Education - 05/25/21 1013     Education Details Update to HEP-see instructions    Person(s) Educated Patient    Methods Explanation;Demonstration;Handout    Comprehension Verbalized understanding;Returned demonstration                 PT Long Term Goals - 05/12/21 1429       PT LONG TERM GOAL #1   Title Pt will be independent with HEP for improved flexibility, posture, balance, and decreased pain.  TARGET 06/04/2021    Time 4    Period Weeks    Status New      PT LONG TERM GOAL #2   Title Pt will improve pain to less than or equal to 2/10 for  daily activities and funcitonal mobility.    Baseline 5/10 at eval    Time 4    Period Weeks    Status New      PT LONG TERM GOAL #3   Title Pt will improve neck flexibility in lateral flexion by 5 degrees each direction, for improved flexibility and to demonstrate decreased pain with daily activities.    Baseline R lateral flexion 20 degrees, L lateral flexion 10 degrees    Time 4    Period Weeks    Status New      PT LONG TERM GOAL #4   Title Pt will improve balance standing on foam surface, eyes closed, at least 15 seconds for improved vestibular system use for balance.    Baseline 6 sec    Time 4    Period Weeks    Status New                   Plan - 05/25/21 1114     Clinical Impression Statement Pt reports that today is a good day; despite his continued headache, he reports no migraine since last PT visit and feels his shoulders are looser and dry needling is helping.  Able to increase theraband exercises for postural strengthening today and add to HEP for posturla stretching.  He will continue to benefit from further skilled PT towards goals of decreased pain and improved posture for overall improved functional mobility.    PT Treatment/Interventions ADLs/Self Care Home Management;Electrical Stimulation;Traction;Moist Heat;Functional mobility training;Therapeutic activities;Therapeutic exercise;Balance training;Neuromuscular re-education;Manual techniques;Patient/family education;Passive range of motion;Dry needling    PT Next Visit Plan continue to check HEP progression and continue to progress scapular stabilization and anterior  chest opening, chest/ upper trap stretches; consider another session of dry needling followed by stretches    Consulted and Agree with Plan of Care Patient             Patient will benefit from skilled therapeutic intervention in order to improve the following deficits and impairments:     Visit Diagnosis: Muscle weakness  (generalized)  Abnormal posture  Other symptoms and signs involving the musculoskeletal system     Problem List Patient Active Problem List   Diagnosis Date Noted   Encounter for fitting and adjustment of hearing aid 11/26/2020   Encounter for fitting and adjustment of spectacles and contact lenses 11/26/2020   DNR (do not resuscitate) discussion 11/26/2020   Aortic atherosclerosis ([place]) 11/26/2020   Allergic rhinitis 10/05/2020   Cough 10/05/2020   Sensorineural hearing loss, bilateral 09/29/2020   Hallux rigidus 09/23/2020   Retinoschisis 09/23/2020   RUQ pain 06/10/2020   History of lacunar cerebrovascular accident 04/05/2020   Headache 03/16/2020   Ankle pain 01/22/2020   Hypothyroidism 11/26/2019   Epididymitis 08/28/2019   Unilateral primary osteoarthritis, left hip 08/13/2019   Status post total replacement of left hip 08/13/2019   Abdominal wall hernia 11/02/2018   Progressive angina ([place]) 10/25/2018   DOE (dyspnea on exertion) 10/25/2018   Abscess of right buttock 11/27/2017   Acute sinus infection 11/05/2017   Pilon fracture 03/17/2017   Pilon fracture of left tibia 03/17/2017   At risk for adverse drug reaction 02/28/2017   Closed fracture dislocation of ankle joint, left, with delayed healing, subsequent encounter 02/22/2017   Renal stone 10/26/2016   Gross hematuria 10/26/2016   Bladder neck obstruction 10/26/2016   External hemorrhoid, thrombosed 06/10/2016   Internal hemorrhoids 06/10/2016   Lumbar back pain 05/19/2016   Osteoarthritis of right hip 10/27/2015   Encounter for well adult exam with abnormal findings 10/27/2015   Chronic chest pain 09/22/2015   Acute gouty arthritis 06/11/2015   Coronary artery disease involving native coronary artery with angina pectoris ([place]) 06/11/2015   Chronic diastolic CHF (congestive heart failure) ([place]) 06/11/2015   H/O tissue AVR Oct 2016 06/10/2015   PAF post CABG/AVR- Amiodarone 06/10/2015   Dyslipidemia  06/10/2015   S/P CABG x 4-Oct 2016 03/30/2015   H/O NSTEMI-Oct 2016 03/27/2015   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 ([place])    Depression 09/08/2014   GERD (gastroesophageal reflux disease)    Essential tremor 03/31/2014   Iliotibial band syndrome affecting left lower leg 01/01/2014   Spondylolisthesis at L4-L5 level 01/01/2014   Sinus bradycardia 12/31/2013   Greater trochanteric bursitis of right hip 12/04/2013   OSA on CPAP    Diet-controlled diabetes mellitus ([place]) 07/08/2009   COLONIC POLYPS, HX OF 12/31/2008   GOUT 12/22/2008   Essential hypertension 12/22/2008   Diverticulosis of colon 12/22/2008   NEPHROLITHIASIS, HX OF 12/22/2008    Chrisoula Zegarra W., PT 05/25/2021, 11:19 AM  [facility] [address] [place], [place], [address] Phone: [phone]   Fax:  [phone]  Name: Troy Banks MRN: [id number] Date of Birth: 1941/01/08

## 2021-05-28 ENCOUNTER — Ambulatory Visit: Payer: Medicare Other | Admitting: Physical Therapy

## 2021-06-01 ENCOUNTER — Ambulatory Visit: Payer: Medicare Other | Admitting: Rehabilitative and Restorative Service Providers"

## 2021-06-01 ENCOUNTER — Encounter: Payer: Self-pay | Admitting: Rehabilitative and Restorative Service Providers"

## 2021-06-01 ENCOUNTER — Other Ambulatory Visit: Payer: Self-pay

## 2021-06-01 DIAGNOSIS — R293 Abnormal posture: Secondary | ICD-10-CM | POA: Diagnosis not present

## 2021-06-01 DIAGNOSIS — M6281 Muscle weakness (generalized): Secondary | ICD-10-CM | POA: Diagnosis not present

## 2021-06-01 DIAGNOSIS — R519 Headache, unspecified: Secondary | ICD-10-CM | POA: Diagnosis not present

## 2021-06-01 DIAGNOSIS — M545 Low back pain, unspecified: Secondary | ICD-10-CM | POA: Diagnosis not present

## 2021-06-01 DIAGNOSIS — G8929 Other chronic pain: Secondary | ICD-10-CM | POA: Diagnosis not present

## 2021-06-01 DIAGNOSIS — R29898 Other symptoms and signs involving the musculoskeletal system: Secondary | ICD-10-CM | POA: Diagnosis not present

## 2021-06-01 DIAGNOSIS — G43719 Chronic migraine without aura, intractable, without status migrainosus: Secondary | ICD-10-CM | POA: Diagnosis not present

## 2021-06-01 NOTE — Therapy (Signed)
[facility] [address] [place], [place], [address] Phone: [phone]   Fax:  [phone]  Physical Therapy Treatment  Patient Details  Name: Troy Banks MRN: [id number] Date of Birth: [birth year] Referring Provider (PT): [name], Josephina Shih Date: 06/01/2021   PT End of Session - 06/01/21 1536     Visit Number 6    Number of Visits 9    Date for PT Re-Evaluation 06/04/21    Authorization Type [employer] Medicare    PT Start Time 9169    PT Stop Time 1536    PT Time Calculation (min) 40 min    Activity Tolerance Patient tolerated treatment well;No increased pain    Behavior During Therapy [facility] for tasks assessed/performed             Past Medical History:  Diagnosis Date   Allergy    Anxiety    PTSD   Aortic stenosis, moderate 03/27/2015   post AVR echo Echo 11/16: Mild LVH, EF 55-60%, normal wall motion, grade 1 diastolic dysfunction, bioprosthetic AVR okay (mean gradient 18 mmHg) with trivial AI, mild LAE, atrial septal aneurysm, small effusion    Arthritis    "qwhere" (10/29/2015)   Cancer ([place])    skin cancer   Carotid artery stenosis    Carotid US 11/17: 1-39% bilateral ICA stenosis. F/u prn   Cervical stenosis of spinal canal    fusion 2006   CHF (congestive heart failure) ([place])    "secondry to [facility]"   Closed left pilon fracture    COLONIC POLYPS, HX OF    Complication of anesthesia    "takes a lot to put him under and has woken up during surgery" Difficult to wake up . PTSD; "does not metabolize RX well"that he gets "violent", per pt.    Coronary artery disease    DIVERTICULOSIS, COLON    Dysrhythmia    2 bouts of afib post op and at home but corrected with medication.   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 ([place])    confirmed heterozygous 10/24/14 labs   PTSD (post-traumatic stress disorder)    PTSD (post-traumatic stress disorder)    Sleep apnea    On a CPAP    Past Surgical History:  Procedure Laterality Date   ANKLE FUSION Left 03/17/2017   Procedure: FUSION  PILON FRACTURE WITH BONE GRAFTING;  Surgeon: Shona Needles, MD;  Location: [place];  Service: Orthopedics;  Laterality: Left;   ANTERIOR FUSION CERVICAL SPINE  2002   AORTIC VALVE REPLACEMENT N/A 03/30/2015   Procedure: AORTIC VALVE REPLACEMENT (AVR);  Surgeon: Grace Isaac, MD;  Location: [place];  Service: Open Heart Surgery;  Laterality: N/A;   CARDIAC CATHETERIZATION N/A 03/26/2015  Procedure: Left Heart Cath and Coronary Angiography;  Surgeon: Leonie Man, MD;  Location: [place] [facility];  Service: Cardiovascular;  Laterality: N/A;   CARDIAC VALVE REPLACEMENT     COLONOSCOPY W/ POLYPECTOMY     CORONARY ARTERY BYPASS GRAFT N/A 03/30/2015   Procedure: CORONARY ARTERY BYPASS GRAFTING (CABG) x four, using left internal mammary artery and left    leg greater saphenous vein harvested endoscopically ;  Surgeon: Grace Isaac, MD;  Location: [place];  Service: Open Heart Surgery;  Laterality: N/A;   CYSTOSCOPY/URETEROSCOPY/HOLMIUM LASER/STENT PLACEMENT Bilateral 11/07/2016   Procedure: CYSTOSCOPY/URETEROSCOPY/HOLMIUM LASER/STENT PLACEMENT;  Surgeon: Nickie Retort, MD;  Location: [facility];  Service: Urology;  Laterality: Bilateral;   CYSTOSCOPY/URETEROSCOPY/HOLMIUM LASER/STENT PLACEMENT Bilateral 11/23/2016   Procedure: CYSTOSCOPY/BILATERAL URETEROSCOPY/HOLMIUM  LASER/STENT EXCHANGE;  Surgeon: Nickie Retort, MD;  Location: [facility];  Service: Urology;  Laterality: Bilateral;  NEEDS 90 MIN    EXTERNAL FIXATION LEG Left 02/22/2017   Procedure: EXTERNAL FIXATION LEFT ANKLE;  Surgeon: Renette Butters, MD;  Location: [place];  Service: Orthopedics;  Laterality: Left;   HEMORRHOID BANDING  1970s   INGUINAL HERNIA REPAIR Left [place]   "cut me open"   LEFT HEART CATH AND CORS/GRAFTS ANGIOGRAPHY N/A 10/25/2018   Procedure: LEFT HEART CATH AND CORS/GRAFTS ANGIOGRAPHY;  Surgeon: Leonie Man, MD;  Location: [place] [facility];  Service: Cardiovascular;  Laterality: N/A;   POSTERIOR FUSION CERVICAL SPINE  2006   TEE WITHOUT CARDIOVERSION N/A 03/30/2015   Procedure: TRANSESOPHAGEAL ECHOCARDIOGRAM (TEE);  Surgeon: Grace Isaac, MD;  Location: [place];  Service: Open Heart Surgery;  Laterality: N/A;   TESTICLE TORSION REDUCTION Right 1960   TOE FUSION Left 1985 & 2004   "great toe" and removal   TONSILLECTOMY     TOTAL HIP ARTHROPLASTY Right 10/27/2015   Procedure: RIGHT TOTAL HIP ARTHROPLASTY ANTERIOR APPROACH and steroid injection right foot;  Surgeon: Mcarthur Rossetti, MD;  Location: [place];  Service: Orthopedics;  Laterality: Right;   TOTAL HIP ARTHROPLASTY Left 08/13/2019   TOTAL HIP ARTHROPLASTY Left 08/13/2019   Procedure: LEFT TOTAL HIP ARTHROPLASTY ANTERIOR APPROACH;  Surgeon: Mcarthur Rossetti, MD;  Location: [place];  Service: Orthopedics;  Laterality: Left;    There were no vitals filed for this visit.   Subjective Assessment - 06/01/21 1458     Subjective The patient reports he has had a migraine for 2 days.  He is wearing sunglasses and notes his wife may have to drive home because of his headache.    Diagnostic tests Has had CT scan and MRI-cleared.  Eye exam at [facility] in [place]    Patient Stated Goals Reduce pain.    Currently in Pain? Yes    Pain Score 7     Pain Location Head    Pain  Orientation Right    Pain Descriptors / Indicators Headache;Shooting    Pain Type Chronic pain    Pain Radiating Towards shooting pain in the R upper trap    Pain Onset More than a month ago    Pain Frequency Constant    Aggravating Factors  varies    Pain Relieving Factors dry needling, stretches                [facility] PT Assessment - 06/01/21 1501       Assessment   Medical Diagnosis intractable migraine    Referring Provider (PT) [name], Wells Guiles  Onset Date/Surgical Date 05/10/21    Hand Dominance Left                           [facility] Adult PT Treatment/Exercise - 06/01/21 1501       Self-Care   Self-Care Other Self-Care Comments    Other Self-Care Comments  discussed walking program for chronic pain mgmt      Exercises   Exercises Neck;Shoulder      Neck Exercises: Standing   Neck Retraction 10 reps    Wall Push Ups 5 reps    Other Standing Exercises standing rolling physioball up and down the wall x 2.5 minutes for warm up      Neck Exercises: Seated   Other Seated Exercise Shoulder rolls forward/back x 10 reps      Shoulder Exercises: Standing   External Rotation Strengthening;Both;10 reps    External Rotation Limitations with ball between scapulae    Retraction Strengthening;Both;10 reps    Retraction Limitations W    Other Standing Exercises horizontal abduction x 10 reps      Manual Therapy   Manual Therapy Soft tissue mobilization    Manual therapy comments manual therapy to address suboccipital, upper trap stretching    Soft tissue mobilization upper trap, scalenes, suboccipitals for decreased tightness, improved flexibility, and decreased pain.              Trigger Point Dry Needling - 06/01/21 1530     Consent Given? Yes    Education Handout Provided Previously provided    Muscles Treated Head and Neck Upper trapezius;Levator scapulae    Upper Trapezius Response Twitch reponse elicited;Palpable increased muscle length   R  and L sides today   Levator Scapulae Response Palpable increased muscle length;Twitch response elicited                        PT Long Term Goals - 06/01/21 1533       PT LONG TERM GOAL #1   Title Pt will be independent with HEP for improved flexibility, posture, balance, and decreased pain.  TARGET 06/04/2021    Time 4    Period Weeks    Status On-going      PT LONG TERM GOAL #2   Title Pt will improve pain to less than or equal to 2/10 for daily activities and funcitonal mobility.    Baseline 5/10 at eval    Time 4    Period Weeks    Status On-going      PT LONG TERM GOAL #3   Title Pt will improve neck flexibility in lateral flexion by 5 degrees each direction, for improved flexibility and to demonstrate decreased pain with daily activities.    Baseline R lateral flexion 20 degrees, L lateral flexion 10 degrees  (R lat flexion 22 and L 16 deg)    Time 4    Period Weeks    Status On-going      PT LONG TERM GOAL #4   Title Pt will improve balance standing on foam surface, eyes closed, at least 15 seconds for improved vestibular system use for balance.    Baseline 6 sec    Time 4    Period Weeks    Status On-going                   Plan - 06/01/21 1630     Clinical Impression Statement The patient  had improvement in headache after STM and DN down to 4/10 reducing ongoing migraine x 2 days.  PT and patient discussed continuing current HEP, and adding walking to help manage chronic pain.  He has pain in L infraspinatus/rhomboid region with L rotation and sidebending.  PT to continue to work to [employer] anticipating renewal next session.    PT Treatment/Interventions ADLs/Self Care Home Management;Electrical Stimulation;Traction;Moist Heat;Functional mobility training;Therapeutic activities;Therapeutic exercise;Balance training;Neuromuscular re-education;Manual techniques;Patient/family education;Passive range of motion;Dry needling    PT Next Visit Plan  continue to check HEP progression and continue to progress scapular stabilization and anterior chest opening, chest/ upper trap stretches; consider another session of dry needling followed by stretches    Consulted and Agree with Plan of Care Patient             Patient will benefit from skilled therapeutic intervention in order to improve the following deficits and impairments:     Visit Diagnosis: Muscle weakness (generalized)  Abnormal posture  Other symptoms and signs involving the musculoskeletal system     Problem List Patient Active Problem List   Diagnosis Date Noted   Encounter for fitting and adjustment of hearing aid 11/26/2020   Encounter for fitting and adjustment of spectacles and contact lenses 11/26/2020   DNR (do not resuscitate) discussion 11/26/2020   Aortic atherosclerosis ([place]) 11/26/2020   Allergic rhinitis 10/05/2020   Cough 10/05/2020   Sensorineural hearing loss, bilateral 09/29/2020   Hallux rigidus 09/23/2020   Retinoschisis 09/23/2020   RUQ pain 06/10/2020   History of lacunar cerebrovascular accident 04/05/2020   Headache 03/16/2020   Ankle pain 01/22/2020   Hypothyroidism 11/26/2019   Epididymitis 08/28/2019   Unilateral primary osteoarthritis, left hip 08/13/2019   Status post total replacement of left hip 08/13/2019   Abdominal wall hernia 11/02/2018   Progressive angina ([place]) 10/25/2018   DOE (dyspnea on exertion) 10/25/2018   Abscess of right buttock 11/27/2017   Acute sinus infection 11/05/2017   Pilon fracture 03/17/2017   Pilon fracture of left tibia 03/17/2017   At risk for adverse drug reaction 02/28/2017   Closed fracture dislocation of ankle joint, left, with delayed healing, subsequent encounter 02/22/2017   Renal stone 10/26/2016   Gross hematuria 10/26/2016   Bladder neck obstruction 10/26/2016   External hemorrhoid, thrombosed 06/10/2016   Internal hemorrhoids 06/10/2016   Lumbar back pain 05/19/2016   Osteoarthritis  of right hip 10/27/2015   Encounter for well adult exam with abnormal findings 10/27/2015   Chronic chest pain 09/22/2015   Acute gouty arthritis 06/11/2015   Coronary artery disease involving native coronary artery with angina pectoris ([place]) 06/11/2015   Chronic diastolic CHF (congestive heart failure) ([place]) 06/11/2015   H/O tissue AVR Oct 2016 06/10/2015   PAF post CABG/AVR- Amiodarone 06/10/2015   Dyslipidemia 06/10/2015   S/P CABG x 4-Oct 2016 03/30/2015   H/O NSTEMI-Oct 2016 03/27/2015   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 ([place])    Depression 09/08/2014   GERD (gastroesophageal reflux disease)    Essential tremor 03/31/2014   Iliotibial band syndrome affecting left lower leg 01/01/2014   Spondylolisthesis at L4-L5 level 01/01/2014   Sinus bradycardia 12/31/2013   Greater trochanteric bursitis of right hip 12/04/2013   OSA on CPAP    Diet-controlled diabetes mellitus ([place]) 07/08/2009   COLONIC POLYPS, HX OF 12/31/2008   GOUT 12/22/2008   Essential hypertension 12/22/2008   Diverticulosis of colon 12/22/2008   NEPHROLITHIASIS, HX OF 12/22/2008    Murray, PT 06/01/2021,  4:36 PM  [facility] [address] [place], [place], [address] Phone: [phone]   Fax:  [phone]  Name: AZZAM MEHRA MRN: [id number] Date of Birth: [birth year]

## 2021-06-04 ENCOUNTER — Other Ambulatory Visit: Payer: Self-pay

## 2021-06-04 ENCOUNTER — Ambulatory Visit: Payer: Medicare Other | Admitting: Rehabilitative and Restorative Service Providers"

## 2021-06-04 ENCOUNTER — Encounter: Payer: Self-pay | Admitting: Rehabilitative and Restorative Service Providers"

## 2021-06-04 DIAGNOSIS — R519 Headache, unspecified: Secondary | ICD-10-CM

## 2021-06-04 DIAGNOSIS — G8929 Other chronic pain: Secondary | ICD-10-CM

## 2021-06-04 DIAGNOSIS — G43719 Chronic migraine without aura, intractable, without status migrainosus: Secondary | ICD-10-CM | POA: Diagnosis not present

## 2021-06-04 DIAGNOSIS — R29898 Other symptoms and signs involving the musculoskeletal system: Secondary | ICD-10-CM | POA: Diagnosis not present

## 2021-06-04 DIAGNOSIS — M6281 Muscle weakness (generalized): Secondary | ICD-10-CM | POA: Diagnosis not present

## 2021-06-04 DIAGNOSIS — M545 Low back pain, unspecified: Secondary | ICD-10-CM | POA: Diagnosis not present

## 2021-06-04 DIAGNOSIS — R293 Abnormal posture: Secondary | ICD-10-CM

## 2021-06-04 NOTE — Therapy (Signed)
[facility] [address], [place], [place], [address] Phone: [phone]   Fax:  [phone]  Physical Therapy Treatment  Patient Details  Name: Troy Banks MRN: [id number] Date of Birth: [birth year] Referring Provider (PT): [name], Josephina Shih Date: 06/04/2021   PT End of Session - 06/04/21 0958     Visit Number 7    Number of Visits 9    Date for PT Re-Evaluation 06/04/21    Authorization Type [employer] Medicare    PT Start Time [phone]    PT Stop Time 1021    PT Time Calculation (min) 29 min    Activity Tolerance Patient tolerated treatment well;No increased pain    Behavior During Therapy [facility] for tasks assessed/performed             Past Medical History:  Diagnosis Date   Allergy    Anxiety    PTSD   Aortic stenosis, moderate 03/27/2015   post AVR echo Echo 11/16: Mild LVH, EF 55-60%, normal wall motion, grade 1 diastolic dysfunction, bioprosthetic AVR okay (mean gradient 18 mmHg) with trivial AI, mild LAE, atrial septal aneurysm, small effusion    Arthritis    "qwhere" (10/29/2015)   Cancer ([place])    skin cancer   Carotid artery stenosis    Carotid US 11/17: 1-39% bilateral ICA stenosis. F/u prn   Cervical stenosis of spinal canal    fusion 2006   CHF (congestive heart failure) ([place])    "secondry to [facility]"   Closed left pilon fracture    COLONIC POLYPS, HX OF    Complication of anesthesia    "takes a lot to put him under and has woken up during surgery" Difficult to wake up . PTSD; "does not metabolize RX well"that he gets "violent", per pt.    Coronary artery disease    DIVERTICULOSIS, COLON    Dysrhythmia    2 bouts of afib post op and at home but corrected with medication.   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 ([place])    confirmed heterozygous 10/24/14 labs   PTSD (post-traumatic stress disorder)    PTSD (post-traumatic stress disorder)    Sleep apnea    On a CPAP    Past Surgical History:  Procedure Laterality Date   ANKLE FUSION Left 03/17/2017   Procedure: FUSION  PILON FRACTURE WITH BONE GRAFTING;  Surgeon: Shona Needles, MD;  Location: [place];  Service: Orthopedics;  Laterality: Left;   ANTERIOR FUSION CERVICAL SPINE  2002   AORTIC VALVE REPLACEMENT N/A 03/30/2015   Procedure: AORTIC VALVE REPLACEMENT (AVR);  Surgeon: Grace Isaac, MD;  Location: [place];  Service: Open Heart Surgery;  Laterality: N/A;   CARDIAC CATHETERIZATION N/A 03/26/2015  Procedure: Left Heart Cath and Coronary Angiography;  Surgeon: Leonie Man, MD;  Location: [place] [facility];  Service: Cardiovascular;  Laterality: N/A;   CARDIAC VALVE REPLACEMENT     COLONOSCOPY W/ POLYPECTOMY     CORONARY ARTERY BYPASS GRAFT N/A 03/30/2015   Procedure: CORONARY ARTERY BYPASS GRAFTING (CABG) x four, using left internal mammary artery and left    leg greater saphenous vein harvested endoscopically ;  Surgeon: Grace Isaac, MD;  Location: [place];  Service: Open Heart Surgery;  Laterality: N/A;   CYSTOSCOPY/URETEROSCOPY/HOLMIUM LASER/STENT PLACEMENT Bilateral 11/07/2016   Procedure: CYSTOSCOPY/URETEROSCOPY/HOLMIUM LASER/STENT PLACEMENT;  Surgeon: Nickie Retort, MD;  Location: [facility];  Service: Urology;  Laterality: Bilateral;   CYSTOSCOPY/URETEROSCOPY/HOLMIUM LASER/STENT PLACEMENT Bilateral 11/23/2016   Procedure: CYSTOSCOPY/BILATERAL URETEROSCOPY/HOLMIUM  LASER/STENT EXCHANGE;  Surgeon: Nickie Retort, MD;  Location: [facility];  Service: Urology;  Laterality: Bilateral;  NEEDS 90 MIN    EXTERNAL FIXATION LEG Left 02/22/2017   Procedure: EXTERNAL FIXATION LEFT ANKLE;  Surgeon: Renette Butters, MD;  Location: [place];  Service: Orthopedics;  Laterality: Left;   HEMORRHOID BANDING  1970s   INGUINAL HERNIA REPAIR Left [place]   "cut me open"   LEFT HEART CATH AND CORS/GRAFTS ANGIOGRAPHY N/A 10/25/2018   Procedure: LEFT HEART CATH AND CORS/GRAFTS ANGIOGRAPHY;  Surgeon: Leonie Man, MD;  Location: [place] [facility];  Service: Cardiovascular;  Laterality: N/A;   POSTERIOR FUSION CERVICAL SPINE  2006   TEE WITHOUT CARDIOVERSION N/A 03/30/2015   Procedure: TRANSESOPHAGEAL ECHOCARDIOGRAM (TEE);  Surgeon: Grace Isaac, MD;  Location: [place];  Service: Open Heart Surgery;  Laterality: N/A;   TESTICLE TORSION REDUCTION Right 1960   TOE FUSION Left 1985 & 2004   "great toe" and removal   TONSILLECTOMY     TOTAL HIP ARTHROPLASTY Right 10/27/2015   Procedure: RIGHT TOTAL HIP ARTHROPLASTY ANTERIOR APPROACH and steroid injection right foot;  Surgeon: Mcarthur Rossetti, MD;  Location: [place];  Service: Orthopedics;  Laterality: Right;   TOTAL HIP ARTHROPLASTY Left 08/13/2019   TOTAL HIP ARTHROPLASTY Left 08/13/2019   Procedure: LEFT TOTAL HIP ARTHROPLASTY ANTERIOR APPROACH;  Surgeon: Mcarthur Rossetti, MD;  Location: [place];  Service: Orthopedics;  Laterality: Left;    There were no vitals filed for this visit.   Subjective Assessment - 06/04/21 0955     Subjective The patient reports he went to 3 other spots before finding [place] this morning.  He is not having an active migraine, but has a "residual effect" in his R side.    Patient Stated Goals Reduce pain.    Currently in Pain? Yes    Pain Score 8     Pain Location Head    Pain Orientation Right    Pain Type Chronic pain    Pain Onset More than a month ago     Pain Frequency Constant    Aggravating Factors  varies    Pain Relieving Factors dry needling, stretches                [facility] PT Assessment - 06/04/21 1006       Assessment   Medical Diagnosis intractable migraine    Referring Provider (PT) Alonza Bogus    Onset Date/Surgical Date 05/10/21                           [facility] Adult PT Treatment/Exercise - 06/04/21 1010  Self-Care   Self-Care Other Self-Care Comments    Other Self-Care Comments  tennis ball roll on the wall for self massage      Neuro Re-ed    Neuro Re-ed Details  compliant surface standing 27 seconds with eyes closed      Manual Therapy   Manual Therapy Soft tissue mobilization    Manual therapy comments --   skilled palpation to assess response to STM and dry needling   Soft tissue mobilization upper trap, levator, rhomboids, infraspinatus              Trigger Point Dry Needling - 06/04/21 1006     Consent Given? Yes    Education Handout Provided Previously provided    Muscles Treated Head and Neck Upper trapezius;Levator scapulae   bilateral   Muscles Treated Upper Quadrant Rhomboids;Infraspinatus   left side only   Upper Trapezius Response Twitch reponse elicited;Palpable increased muscle length    Levator Scapulae Response Twitch response elicited;Palpable increased muscle length    Rhomboids Response Twitch response elicited;Palpable increased muscle length    Infraspinatus Response Palpable increased muscle length                   PT Education - 06/04/21 1243     Education Details HEP-- added tennis ball soft tissue masssage    Person(s) Educated Patient    Methods Explanation;Demonstration    Comprehension Verbalized understanding                 PT Long Term Goals - 06/04/21 1020       PT LONG TERM GOAL #1   Title Pt will be independent with HEP for improved flexibility, posture, balance, and decreased pain.  TARGET 06/04/2021    Time 4     Period Weeks    Status Achieved      PT LONG TERM GOAL #2   Title Pt will improve pain to less than or equal to 2/10 for daily activities and funcitonal mobility.    Baseline 5/10 at eval; 8/10 today reporting "more tension"    Time 4    Period Weeks    Status Not Met      PT LONG TERM GOAL #3   Title Pt will improve neck flexibility in lateral flexion by 5 degrees each direction, for improved flexibility and to demonstrate decreased pain with daily activities.    Baseline R lateral flexion 20 degrees, L lateral flexion 10 degrees  (R lat flexion 22 and L 16 deg)    Time 4    Period Weeks    Status Partially Met      PT LONG TERM GOAL #4   Title Pt will improve balance standing on foam surface, eyes closed, at least 15 seconds for improved vestibular system use for balance.    Baseline 6 sec up to 26 seconds on 06/04/21    Time 4    Period Weeks    Status Achieved                   Plan - 06/04/21 1243     Clinical Impression Statement The patient continues with ongoing chronis neck/head pain.  PT working on point specific pain today with STM and dry needling.  We have also discussed continuing HEP.  PT to write recert/goals next session.  Encouraged patient to speak with MD next week at annual check up regarding depression as this appears to play a part in chronic pain for  patient per our discussion (in past he has stated "I think it is 90% of my problem").    PT Treatment/Interventions ADLs/Self Care Home Management;Electrical Stimulation;Traction;Moist Heat;Functional mobility training;Therapeutic activities;Therapeutic exercise;Balance training;Neuromuscular re-education;Manual techniques;Patient/family education;Passive range of motion;Dry needling    PT Next Visit Plan Update LTGs, discuss annual check with MD, discuss plan for ongoing PT-- progression of exercise, walking program    Consulted and Agree with Plan of Care Patient             Patient will benefit  from skilled therapeutic intervention in order to improve the following deficits and impairments:     Visit Diagnosis: Muscle weakness (generalized)  Abnormal posture  Other symptoms and signs involving the musculoskeletal system  Chronic intractable headache, unspecified headache type  Chronic left-sided low back pain without sciatica     Problem List Patient Active Problem List   Diagnosis Date Noted   Encounter for fitting and adjustment of hearing aid 11/26/2020   Encounter for fitting and adjustment of spectacles and contact lenses 11/26/2020   DNR (do not resuscitate) discussion 11/26/2020   Aortic atherosclerosis ([place]) 11/26/2020   Allergic rhinitis 10/05/2020   Cough 10/05/2020   Sensorineural hearing loss, bilateral 09/29/2020   Hallux rigidus 09/23/2020   Retinoschisis 09/23/2020   RUQ pain 06/10/2020   History of lacunar cerebrovascular accident 04/05/2020   Headache 03/16/2020   Ankle pain 01/22/2020   Hypothyroidism 11/26/2019   Epididymitis 08/28/2019   Unilateral primary osteoarthritis, left hip 08/13/2019   Status post total replacement of left hip 08/13/2019   Abdominal wall hernia 11/02/2018   Progressive angina ([place]) 10/25/2018   DOE (dyspnea on exertion) 10/25/2018   Abscess of right buttock 11/27/2017   Acute sinus infection 11/05/2017   Pilon fracture 03/17/2017   Pilon fracture of left tibia 03/17/2017   At risk for adverse drug reaction 02/28/2017   Closed fracture dislocation of ankle joint, left, with delayed healing, subsequent encounter 02/22/2017   Renal stone 10/26/2016   Gross hematuria 10/26/2016   Bladder neck obstruction 10/26/2016   External hemorrhoid, thrombosed 06/10/2016   Internal hemorrhoids 06/10/2016   Lumbar back pain 05/19/2016   Osteoarthritis of right hip 10/27/2015   Encounter for well adult exam with abnormal findings 10/27/2015   Chronic chest pain 09/22/2015   Acute gouty arthritis 06/11/2015   Coronary artery  disease involving native coronary artery with angina pectoris ([place]) 06/11/2015   Chronic diastolic CHF (congestive heart failure) ([place]) 06/11/2015   H/O tissue AVR Oct 2016 06/10/2015   PAF post CABG/AVR- Amiodarone 06/10/2015   Dyslipidemia 06/10/2015   S/P CABG x 4-Oct 2016 03/30/2015   H/O NSTEMI-Oct 2016 03/27/2015   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 [place], [place], [address] Phone: [phone]   Fax:  [phone]  Name: DAYSHAWN IRIZARRY MRN: [id number] Date of Birth: [birth year]

## 2021-06-04 NOTE — Patient Instructions (Signed)
Access Code: [email or web address] URL: [email or web address] Date: 06/04/2021 Prepared by: Rudell Cobb  Program Notes Standing ball against the wall by the shoulder blade to massage tight muscles.   Exercises Supine Cervical Retraction with Towel - 1-2 x daily - 7 x weekly - 2 sets - 5 reps - 3-5 sec hold Sidelying Thoracic Rotation with Open Book - 2 x daily - 7 x weekly - 1 sets - 10 reps Shoulder External Rotation and Scapular Retraction - 2 x daily - 7 x weekly - 1 sets - 12 reps - 3-5 seconds hold Standing Shoulder W at Wall - 2 x daily - 7 x weekly - 1 sets - 12 reps - 3-5 seconds hold Shoulder Rolls in Sitting - 2 x daily - 7 x weekly - 1 sets - 10 reps Standing Isometric Cervical Retraction with Chin Tucks and Ball at [employer] - 1-2 x daily - 5 x weekly - 1 sets - 10 reps - 3-5 hold Doorway Pec Stretch at 60 Elevation - 1 x daily - 5 x weekly - 1 sets - 5 reps - 15 sec hold

## 2021-06-07 ENCOUNTER — Encounter: Payer: Self-pay | Admitting: Gastroenterology

## 2021-06-08 ENCOUNTER — Encounter (HOSPITAL_COMMUNITY): Payer: Self-pay | Admitting: Gastroenterology

## 2021-06-08 ENCOUNTER — Ambulatory Visit: Payer: Medicare Other | Admitting: Internal Medicine

## 2021-06-09 ENCOUNTER — Other Ambulatory Visit: Payer: Self-pay

## 2021-06-09 ENCOUNTER — Encounter: Payer: Self-pay | Admitting: Internal Medicine

## 2021-06-09 ENCOUNTER — Ambulatory Visit (INDEPENDENT_AMBULATORY_CARE_PROVIDER_SITE_OTHER): Payer: Medicare Other | Admitting: Internal Medicine

## 2021-06-09 VITALS — BP 132/72 | HR 66 | Resp 18 | Ht 67.0 in | Wt 218.8 lb

## 2021-06-09 DIAGNOSIS — I700 Atherosclerosis of aorta: Secondary | ICD-10-CM

## 2021-06-09 DIAGNOSIS — E559 Vitamin D deficiency, unspecified: Secondary | ICD-10-CM

## 2021-06-09 DIAGNOSIS — Z0001 Encounter for general adult medical examination with abnormal findings: Secondary | ICD-10-CM

## 2021-06-09 DIAGNOSIS — E039 Hypothyroidism, unspecified: Secondary | ICD-10-CM | POA: Diagnosis not present

## 2021-06-09 DIAGNOSIS — E538 Deficiency of other specified B group vitamins: Secondary | ICD-10-CM | POA: Diagnosis not present

## 2021-06-09 DIAGNOSIS — E785 Hyperlipidemia, unspecified: Secondary | ICD-10-CM

## 2021-06-09 DIAGNOSIS — I10 Essential (primary) hypertension: Secondary | ICD-10-CM

## 2021-06-09 DIAGNOSIS — E119 Type 2 diabetes mellitus without complications: Secondary | ICD-10-CM | POA: Diagnosis not present

## 2021-06-09 LAB — HEPATIC FUNCTION PANEL
ALT: 29 U/L (ref 0–53)
AST: 23 U/L (ref 0–37)
Albumin: 4.3 g/dL (ref 3.5–5.2)
Alkaline Phosphatase: 55 U/L (ref 39–117)
Bilirubin, Direct: 0.2 mg/dL (ref 0.0–0.3)
Total Bilirubin: 1.2 mg/dL (ref 0.2–1.2)
Total Protein: 6.7 g/dL (ref 6.0–8.3)

## 2021-06-09 LAB — LIPID PANEL
Cholesterol: 129 mg/dL (ref 0–200)
HDL: 39 mg/dL — ABNORMAL LOW (ref >39.00)
NonHDL: 90.2
Total CHOL/HDL Ratio: 3
Triglycerides: 295 mg/dL — ABNORMAL HIGH (ref 0.0–149.0)
VLDL: 59 mg/dL — ABNORMAL HIGH (ref 0.0–40.0)

## 2021-06-09 LAB — BASIC METABOLIC PANEL
BUN: 18 mg/dL (ref 6–23)
CO2: 26 mEq/L (ref 19–32)
Calcium: 9.6 mg/dL (ref 8.4–10.5)
Chloride: 105 mEq/L (ref 96–112)
Creatinine, Ser: 1 mg/dL (ref 0.40–1.50)
GFR: 72.12 mL/min (ref >60.00)
Glucose, Bld: 101 mg/dL — ABNORMAL HIGH (ref 70–99)
Potassium: 4.1 mEq/L (ref 3.5–5.1)
Sodium: 140 mEq/L (ref 135–145)

## 2021-06-09 LAB — URINALYSIS, ROUTINE W REFLEX MICROSCOPIC
Bilirubin Urine: NEGATIVE
Ketones, ur: NEGATIVE
Leukocytes,Ua: NEGATIVE
Nitrite: NEGATIVE
Specific Gravity, Urine: 1.01 (ref 1.000–1.030)
Total Protein, Urine: NEGATIVE
Urine Glucose: NEGATIVE
Urobilinogen, UA: 1 (ref 0.0–1.0)
pH: 6.5 (ref 5.0–8.0)

## 2021-06-09 LAB — TSH: TSH: 2.59 u[IU]/mL (ref 0.35–5.50)

## 2021-06-09 LAB — HEMOGLOBIN A1C: Hgb A1c MFr Bld: 6.6 % — ABNORMAL HIGH (ref 4.6–6.5)

## 2021-06-09 LAB — CBC WITH DIFFERENTIAL/PLATELET
Basophils Absolute: 0.1 10*3/uL (ref 0.0–0.1)
Basophils Relative: 1.1 % (ref 0.0–3.0)
Eosinophils Absolute: 0.1 10*3/uL (ref 0.0–0.7)
Eosinophils Relative: 1.2 % (ref 0.0–5.0)
HCT: 46.9 % (ref 39.0–52.0)
Hemoglobin: 15.7 g/dL (ref 13.0–17.0)
Lymphocytes Relative: 19.5 % (ref 12.0–46.0)
Lymphs Abs: 1.6 10*3/uL (ref 0.7–4.0)
MCHC: 33.5 g/dL (ref 30.0–36.0)
MCV: 93.2 fl (ref 78.0–100.0)
Monocytes Absolute: 0.7 10*3/uL (ref 0.1–1.0)
Monocytes Relative: 8.9 % (ref 3.0–12.0)
Neutro Abs: 5.7 10*3/uL (ref 1.4–7.7)
Neutrophils Relative %: 69.3 % (ref 43.0–77.0)
Platelets: 167 10*3/uL (ref 150.0–400.0)
RBC: 5.03 Mil/uL (ref 4.22–5.81)
RDW: 14.1 % (ref 11.5–15.5)
WBC: 8.3 10*3/uL (ref 4.0–10.5)

## 2021-06-09 LAB — LDL CHOLESTEROL, DIRECT: Direct LDL: 73 mg/dL

## 2021-06-09 LAB — T4, FREE: Free T4: 0.86 ng/dL (ref 0.60–1.60)

## 2021-06-09 LAB — VITAMIN D 25 HYDROXY (VIT D DEFICIENCY, FRACTURES): VITD: 33.23 ng/mL (ref 30.00–100.00)

## 2021-06-09 LAB — VITAMIN B12: Vitamin B-12: 1103 pg/mL — ABNORMAL HIGH (ref 211–911)

## 2021-06-09 NOTE — Progress Notes (Signed)
Patient ID: Troy Banks, male   DOB: 1941/04/09, 81 y.o.   MRN: [id number]         Chief Complaint:: wellness exam and dm, hld, hytn, low thyroid       HPI:  Troy Banks is a 81 y.o. male here for wellness exam; declines covd booster, flu shot, shingrix, colonoscopy o/w up to date                        Also Pt denies chest pain, increased sob or doe, wheezing, orthopnea, PND, increased LE swelling, palpitations, dizziness or syncope.   Pt denies polydipsia, polyuria, or new focal neuro s/s.   Pt denies fever, wt loss, night sweats, loss of appetite, or other constitutional symptoms   Denies hyper or hypo thyroid symptoms such as voice, skin or hair change.  No other new complaints.  Not taking Vit d.  Pt is s/p renal stone x 2 in 2022   Wt Readings from Last 3 Encounters:  06/09/21 218 lb 12.8 oz (99.2 kg)  05/10/21 218 lb 3.2 oz (99 kg)  04/06/21 216 lb (98 kg)   BP Readings from Last 3 Encounters:  06/09/21 132/72  05/10/21 (!) 142/82  04/06/21 138/68   Immunization History  Administered Date(s) Administered   DT (Pediatric) 06/06/2008   PFIZER(Purple Top)SARS-COV-2 Vaccination 02/17/2020, 03/09/2020, 08/10/2020   Pneumococcal Conjugate-13 09/08/2014, 10/04/2017   Pneumococcal Polysaccharide-23 10/27/2017   Pneumococcal-Unspecified 02/20/2008   Td 06/06/2008   Tdap 11/21/2012, 11/02/2018   Health Maintenance Due  Topic Date Due   URINE MICROALBUMIN  06/10/2021      Past Medical History:  Diagnosis Date   Allergy    Anxiety    PTSD   Aortic stenosis, moderate 03/27/2015   post AVR echo Echo 11/16: Mild LVH, EF 55-60%, normal wall motion, grade 1 diastolic dysfunction, bioprosthetic AVR okay (mean gradient 18 mmHg) with trivial AI, mild LAE, atrial septal aneurysm, small effusion    Arthritis    "qwhere" (10/29/2015)   Cancer ([place])    skin cancer   Carotid artery stenosis    Carotid US 11/17: 1-39% bilateral ICA stenosis. F/u prn   Cervical stenosis of  spinal canal    fusion 2006   CHF (congestive heart failure) ([place])    "secondry to [facility]"   Closed left pilon fracture    COLONIC POLYPS, HX OF    Complication of anesthesia    "takes a lot to put him under and has woken up during surgery" Difficult to wake up . PTSD; "does not metabolize RX well"that he gets "violent", per pt.    Coronary artery disease    DIVERTICULOSIS, COLON    Dysrhythmia    2 bouts of afib post op and at home but corrected with medication.   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)    confirmed heterozygous 10/24/14 labs   PTSD (post-traumatic stress disorder)    PTSD (post-traumatic stress disorder)    Sleep apnea    On a CPAP   Past Surgical History:  Procedure Laterality Date   ANKLE FUSION Left 03/17/2017   Procedure: FUSION  PILON FRACTURE WITH BONE GRAFTING;  Surgeon: Shona Needles, MD;  Location: [place];  Service: Orthopedics;  Laterality: Left;   ANTERIOR FUSION CERVICAL SPINE  2002   AORTIC VALVE REPLACEMENT N/A 03/30/2015   Procedure: AORTIC VALVE REPLACEMENT (AVR);  Surgeon: Grace Isaac, MD;  Location: [place];  Service: Open Heart Surgery;  Laterality: N/A;   CARDIAC CATHETERIZATION N/A 03/26/2015   Procedure: Left Heart Cath and Coronary Angiography;  Surgeon: Leonie Man, MD;  Location: [place] [facility];  Service: Cardiovascular;  Laterality: N/A;   CARDIAC VALVE REPLACEMENT      COLONOSCOPY W/ POLYPECTOMY     CORONARY ARTERY BYPASS GRAFT N/A 03/30/2015   Procedure: CORONARY ARTERY BYPASS GRAFTING (CABG) x four, using left internal mammary artery and left    leg greater saphenous vein harvested endoscopically ;  Surgeon: Grace Isaac, MD;  Location: [place];  Service: Open Heart Surgery;  Laterality: N/A;   CYSTOSCOPY/URETEROSCOPY/HOLMIUM LASER/STENT PLACEMENT Bilateral 11/07/2016   Procedure: CYSTOSCOPY/URETEROSCOPY/HOLMIUM LASER/STENT PLACEMENT;  Surgeon: Nickie Retort, MD;  Location[facility];  Service: Urology;  Laterality: Bilateral;   CYSTOSCOPY/URETEROSCOPY/HOLMIUM LASER/STENT PLACEMENT Bilateral 11/23/2016   Procedure: CYSTOSCOPY/BILATERAL URETEROSCOPY/HOLMIUM LASER/STENT EXCHANGE;  Surgeon: Nickie Retort, MD;  Location[facility];  Service: Urology;  Laterality: Bilateral;  NEEDS 90 MIN    EXTERNAL FIXATION LEG Left 02/22/2017   Procedure: EXTERNAL FIXATION LEFT ANKLE;  Surgeon: Renette Butters, MD;  Location: [place];  Service: Orthopedics;  Laterality: Left;   HEMORRHOID BANDING  1970s   INGUINAL HERNIA REPAIR Left [place]   "cut me open"   LEFT HEART CATH AND CORS/GRAFTS ANGIOGRAPHY N/A 10/25/2018   Procedure: LEFT HEART CATH AND CORS/GRAFTS ANGIOGRAPHY;  Surgeon: Leonie Man, MD;  Location: [place] [facility];  Service: Cardiovascular;  Laterality: N/A;   POSTERIOR FUSION CERVICAL SPINE  2006   TEE WITHOUT CARDIOVERSION N/A 03/30/2015   Procedure: TRANSESOPHAGEAL ECHOCARDIOGRAM (TEE);  Surgeon: Grace Isaac, MD;  Location: [place];  Service: Open Heart Surgery;  Laterality: N/A;   TESTICLE TORSION REDUCTION Right 1960   TOE FUSION Left 1985 & 2004   "great toe" and removal   TONSILLECTOMY     TOTAL HIP ARTHROPLASTY Right 10/27/2015   Procedure: RIGHT TOTAL HIP ARTHROPLASTY ANTERIOR APPROACH and steroid injection right foot;  Surgeon: Mcarthur Rossetti, MD;  Location: [place];  Service: Orthopedics;  Laterality:  Right;   TOTAL HIP ARTHROPLASTY Left 08/13/2019   TOTAL HIP ARTHROPLASTY Left 08/13/2019   Procedure: LEFT TOTAL HIP ARTHROPLASTY ANTERIOR APPROACH;  Surgeon: Mcarthur Rossetti, MD;  Location: [place];  Service: Orthopedics;  Laterality: Left;    reports that he quit smoking about 31 years ago. His smoking use included cigarettes. He has a 34.00 pack-year smoking history. He has never used smokeless tobacco. He reports current alcohol use of about 3.0 standard drinks per week. He reports that he does not use drugs. family history includes Alcohol abuse in an other family member; Arthritis in an other family member; Diabetes in his mother and paternal grandfather; Drug abuse  in his sister; Healthy in his brother, daughter, and son; Heart disease in his father, maternal grandfather, maternal grandmother, mother, and paternal grandfather; Hypertension in his father, maternal grandfather, maternal grandmother, mother, and paternal grandfather; Prostate cancer in his maternal uncle; Tremor in his sister. Allergies  Allergen Reactions   Acyclovir And Related Other (See Comments)    Accumulates and causes stroke-like side-effects due to cytochrome P450 enzyme deficiency   Benzodiazepines Other (See Comments)    Patient states he gets stroke symptoms with benzo's.    Compazine [Prochlorperazine Maleate] Other (See Comments)    Accumulates and causes stroke-like side-effects due to cytochrome P450 enzyme deficiency   Coreg [Carvedilol] Other (See Comments)    Accumulates and causes stroke-like side-effects due to cytochrome P450 enzyme deficiency Patient poor metabolizer of CYP2D6 - Coreg undergoes extensive hepatic (including 2D6)   Flomax [Tamsulosin Hcl] Other (See Comments)    Accumulates and causes stroke-like side-effects due to cytochrome P450 enzyme deficiency   Lisinopril Other (See Comments)    Hyperkalemia. Accumulates and causes stroke-like side-effects due to cytochrome P450 enzyme  deficiency.   Mobic [Meloxicam] Other (See Comments)    Accumulates and causes stroke-like side-effects due to cytochrome P450 enzyme deficiency   Ondansetron Other (See Comments)    Accumulates and causes stroke-like side-effects due to cytochrome P450 enzyme deficiency    Oxycodone Other (See Comments)    Accumulates and causes stroke-like side-effects due to cytochrome P450 enzyme deficiency   Promethazine Other (See Comments)    Accumulates and causes stroke-like side-effects due to cytochrome P450 enzyme deficiency   Sertraline Hcl Other (See Comments)    Accumulates and causes stroke-like side-effects due to cytochrome P450 enzyme deficiency   Tramadol Other (See Comments)    Accumulates and causes stroke-like side-effects due to cytochrome P450 enzyme deficiency   Atorvastatin Other (See Comments)    MYALGIAS   Colchicine Other (See Comments)    ataxia   Fentanyl Other (See Comments)    Accumulates and cause stroke like symptoms   Current Outpatient Medications on File Prior to Visit  Medication Sig Dispense Refill   acetaminophen (TYLENOL) 500 MG tablet Take 1,000 mg by mouth every 6 (six) hours as needed for moderate pain.     allopurinol (ZYLOPRIM) 300 MG tablet TAKE 1 TABLET BY MOUTH EVERY DAY 90 tablet 0   amLODipine (NORVASC) 10 MG tablet TAKE 1 TABLET BY MOUTH EVERY DAY 90 tablet 0   Ascorbic Acid (VITAMIN C) 1000 MG tablet Take 1,000 mg by mouth daily.     aspirin EC 81 MG tablet Take 81 mg by mouth every other day. Swallow whole.     calcium carbonate (TUMS EX) 750 MG chewable tablet Chew 1 tablet by mouth daily as needed for heartburn.      Carboxymethylcellulose Sodium (LUBRICANT EYE DROPS OP) Place 1 drop into both eyes 2 (two) times daily as needed (dry/irritated eyes.).      Cholecalciferol (VITAMIN D-3) 25 MCG (1000 UT) CAPS Take 1,000 Units by mouth daily.     famotidine (PEPCID) 40 MG tablet TAKE 1 TABLET BY MOUTH EVERY DAY 90 tablet 2   furosemide (LASIX) 40  MG tablet TAKE 1 TABLET BY MOUTH EVERY OTHER DAY 45 tablet 2   gabapentin (NEURONTIN) 300 MG capsule Take 1 capsule (300 mg total) by mouth at bedtime. 90 capsule 1   levothyroxine (SYNTHROID) 25 MCG tablet TAKE 1 TABLET BY MOUTH EVERY DAY WITH BREAKFAST 90 tablet 3   nitroGLYCERIN (NITROSTAT) 0.4  MG SL tablet PLACE 1 TABLET UNDER THE TONGUE EVERY 5 MINUTES AS NEEDED FOR CHEST PAIN. (Patient taking differently: Place 0.4 mg under the tongue every 5 (five) minutes as needed for chest pain.) 25 tablet 3   primidone (MYSOLINE) 50 MG tablet Take 1 tablet (50 mg total) by mouth 2 (two) times daily. 180 tablet 2   rosuvastatin (CRESTOR) 10 MG tablet TAKE 1/2 TABLET BY MOUTH EVERY DAY 45 tablet 3   tiZANidine (ZANAFLEX) 4 MG tablet Take 1 tablet by mouth every 8 (eight) hours as needed for muscle spasms.     Turmeric 500 MG CAPS Take 500 mg by mouth daily.      vitamin B-12 (CYANOCOBALAMIN) 1000 MCG tablet Take 1,000 mcg by mouth daily.      benzonatate (TESSALON PERLES) 100 MG capsule Take 1 capsule (100 mg total) by mouth 3 (three) times daily as needed for cough. (Patient not taking: Reported on 06/07/2021) 60 capsule 0   PEG-KCl-NaCl-NaSulf-Na Asc-C (PLENVU) 140 g SOLR Take 140 g by mouth as directed. (Patient not taking: Reported on 05/12/2021) 1 each 0   [DISCONTINUED] ranitidine (ZANTAC) 150 MG tablet TAKE 1 TABLET BY MOUTH 2 TIMES DAILY (Patient taking differently: No sig reported) 180 tablet 0   Current Facility-Administered Medications on File Prior to Visit  Medication Dose Route Frequency Provider Last Rate Last Admin   sodium chloride flush (NS) 0.9 % injection 3 mL  3 mL Intravenous Q12H Meng, [place], PA            ROS:  All others reviewed and negative.  Objective        PE:  BP 132/72    Pulse 66    Resp 18    Ht 5\' 7"  (1.702 m)    Wt 218 lb 12.8 oz (99.2 kg)    SpO2 96%    BMI 34.27 kg/m                 Constitutional: Pt appears in NAD               HENT: Head: NCAT.                 Right Ear: External ear normal.                 Left Ear: External ear normal.                Eyes: . Pupils are equal, round, and reactive to light. Conjunctivae and EOM are normal               Nose: without d/c or deformity               Neck: Neck supple. Gross normal ROM               Cardiovascular: Normal rate and regular rhythm.                 Pulmonary/Chest: Effort normal and breath sounds without rales or wheezing.                Abd:  Soft, NT, ND, + BS, no organomegaly               Neurological: Pt is alert. At baseline orientation, motor grossly intact               Skin: Skin is warm. No rashes, no other new lesions, LE edema - none  Psychiatric: Pt behavior is normal without agitation   Micro: none  Cardiac tracings I have personally interpreted today:  none  Pertinent Radiological findings (summarize): none   Lab Results  Component Value Date   WBC 8.3 06/09/2021   HGB 15.7 06/09/2021   HCT 46.9 06/09/2021   PLT 167.0 06/09/2021   GLUCOSE 101 (H) 06/09/2021   CHOL 129 06/09/2021   TRIG 295.0 (H) 06/09/2021   HDL 39.00 (L) 06/09/2021   LDLDIRECT 73.0 06/09/2021   LDLCALC 55 06/10/2020   ALT 29 06/09/2021   AST 23 06/09/2021   NA 140 06/09/2021   K 4.1 06/09/2021   CL 105 06/09/2021   CREATININE 1.00 06/09/2021   BUN 18 06/09/2021   CO2 26 06/09/2021   TSH 2.59 06/09/2021   PSA 1.49 06/10/2020   INR 1.47 03/30/2015   HGBA1C 6.6 (H) 06/09/2021   MICROALBUR 8.8 (H) 06/10/2020   Assessment/Plan:  Troy Banks is a 81 y.o. White or Caucasian [1] male with  has a past medical history of Allergy, Anxiety, Aortic stenosis, moderate (03/27/2015), Arthritis, Cancer ([place]), Carotid artery stenosis, Cervical stenosis of spinal canal, CHF (congestive heart failure) ([place]), Closed left pilon fracture, COLONIC POLYPS, HX OF, Complication of anesthesia, Coronary artery disease, DIVERTICULOSIS, COLON, Dysrhythmia, Familial tremor (03/19/2014), Family  history of adverse reaction to anesthesia, GERD (gastroesophageal reflux disease), GOUT, Headache, Heart murmur, High cholesterol, History of blood transfusion, History of kidney stones, HYPERTENSION, Hypothyroidism, Myocardial infarction ([place]), OSA on CPAP, Pneumonia, 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  Encounter for well adult exam with abnormal findings Age and sex appropriate education and counseling updated with regular exercise and diet Referrals for preventative services - declines colonoscopy Immunizations addressed - declines covid booster, flu shot, shingirx Smoking counseling  - none needed Evidence for depression or other mood disorder - none significant Most recent labs reviewed. I have personally reviewed and have noted: 1) the patient's medical and social history 2) The patient's current medications and supplements 3) The patient's height, weight, and BMI have been recorded in the chart   Aortic atherosclerosis (HCC) Pt to continue crestor, low chol diet, excercise  Diet-controlled diabetes mellitus ([place]) Lab Results  Component Value Date   HGBA1C 6.6 (H) 06/09/2021   Stable, pt to continue current medical treatment   - diet   Dyslipidemia Lab Results  Component Value Date   LDLCALC 55 06/10/2020   Stable, pt to continue current statin crestor   Essential hypertension BP Readings from Last 3 Encounters:  06/09/21 132/72  05/10/21 (!) 142/82  04/06/21 138/68   Stable, pt to continue medical treatment amlodipine   Hypothyroidism Lab Results  Component Value Date   TSH 2.59 06/09/2021   Stable, pt to continue levothyroxine   Vitamin D deficiency Last vitamin D Lab Results  Component Value Date   VD25OH 33.23 06/09/2021   Low, to start oral replacement  Followup: Return in about 6 months (around 12/07/2021).  Cathlean Cower,  MD 06/13/2021 8:34 PM [facility]

## 2021-06-09 NOTE — Patient Instructions (Signed)
Please continue all other medications as before, and refills have been done if requested.  Please have the pharmacy call with any other refills you may need.  Please continue your efforts at being more active, low cholesterol diet, and weight control.  You are otherwise up to date with prevention measures today.  Please keep your appointments with your specialists as you may have planned  Please go to the LAB at the blood drawing area for the tests to be done  You will be contacted by phone if any changes need to be made immediately.  Otherwise, you will receive a letter about your results with an explanation, but please check with MyChart first.  Please remember to sign up for MyChart if you have not done so, as this will be important to you in the future with finding out test results, communicating by private email, and scheduling acute appointments online when needed.  Please make an Appointment to return in 6 months, or sooner if needed, also with Lab Appointment for testing done 3-5 days before at the [facility] (so this is for TWO appointments - please see the scheduling desk as you leave)  Due to the ongoing Covid 19 pandemic, our lab now requires an appointment for any labs done at our office.  If you need labs done and do not have an appointment, please call our office ahead of time to schedule before presenting to the lab for your testing.

## 2021-06-12 ENCOUNTER — Other Ambulatory Visit: Payer: Self-pay | Admitting: Internal Medicine

## 2021-06-12 NOTE — Telephone Encounter (Signed)
Please refill as per office routine med refill policy (all routine meds to be refilled for 3 mo or monthly (per pt preference) up to one year from last visit, then month to month grace period for 3 mo, then further med refills will have to be denied) ? ?

## 2021-06-13 ENCOUNTER — Encounter: Payer: Self-pay | Admitting: Internal Medicine

## 2021-06-13 DIAGNOSIS — E559 Vitamin D deficiency, unspecified: Secondary | ICD-10-CM | POA: Insufficient documentation

## 2021-06-13 NOTE — Assessment & Plan Note (Signed)
Lab Results  Component Value Date   LDLCALC 55 06/10/2020   Stable, pt to continue current statin crestor

## 2021-06-13 NOTE — Assessment & Plan Note (Signed)
Pt to continue crestor, low chol diet, excercise

## 2021-06-13 NOTE — Assessment & Plan Note (Signed)
Lab Results  Component Value Date   HGBA1C 6.6 (H) 06/09/2021   Stable, pt to continue current medical treatment   - diet

## 2021-06-13 NOTE — Assessment & Plan Note (Signed)
Lab Results  Component Value Date   TSH 2.59 06/09/2021   Stable, pt to continue levothyroxine

## 2021-06-13 NOTE — Assessment & Plan Note (Signed)
BP Readings from Last 3 Encounters:  06/09/21 132/72  05/10/21 (!) 142/82  04/06/21 138/68   Stable, pt to continue medical treatment amlodipine

## 2021-06-13 NOTE — Addendum Note (Signed)
Addended by: [name] on: 06/13/2021 08:35 PM   Modules accepted: Orders

## 2021-06-13 NOTE — Assessment & Plan Note (Signed)
Age and sex appropriate education and counseling updated with regular exercise and diet Referrals for preventative services - declines colonoscopy Immunizations addressed - declines covid booster, flu shot, shingirx Smoking counseling  - none needed Evidence for depression or other mood disorder - none significant Most recent labs reviewed. I have personally reviewed and have noted: 1) the patient's medical and social history 2) The patient's current medications and supplements 3) The patient's height, weight, and BMI have been recorded in the chart

## 2021-06-13 NOTE — Assessment & Plan Note (Signed)
Last vitamin D Lab Results  Component Value Date   VD25OH 33.23 06/09/2021   Low, to start oral replacement  

## 2021-06-14 ENCOUNTER — Telehealth: Payer: Self-pay | Admitting: Gastroenterology

## 2021-06-14 ENCOUNTER — Ambulatory Visit: Payer: Medicare Other | Admitting: Rehabilitative and Restorative Service Providers"

## 2021-06-14 MED ORDER — GOLYTELY 236 G PO SOLR: 4000.0000 mL | Freq: Once | ORAL | 0 refills | Status: AC

## 2021-06-14 NOTE — Telephone Encounter (Signed)
Pharmacy called wanting to know if there is a alternative for Plenvu for patient. Seeking advice, please advise.

## 2021-06-14 NOTE — Telephone Encounter (Signed)
New prep has been sent in and new instructions for [facility] have been sent via mychart. Pt is aware of the new changes

## 2021-06-15 ENCOUNTER — Telehealth: Payer: Self-pay

## 2021-06-15 NOTE — Telephone Encounter (Signed)
Called and spoke with [name]. She confirmed that patient will still be proceeding with colonoscopy at [facility] on 06/17/21. She is aware that he will need to arrive at 6 am with a care partner. [name] verbalized understanding and had no concerns at the end of the call.

## 2021-06-16 ENCOUNTER — Telehealth: Payer: Self-pay | Admitting: Gastroenterology

## 2021-06-16 NOTE — Telephone Encounter (Signed)
Returned call to patient's wife. She states that pt does not want to proceed with colonoscopy. I asked her was there a specific reason that he is wanting to cancel, she responded "when you find out let me know". She was not sure if patient had any plans to reschedule. I did tell her that we are doing this for surveillance due to his hx of colon polyps. Pt's wife states that she knows. I told her that I would let you know. She had no other concerns at the end of the call.   Hospital colonoscopy has been cancelled for 06/17/21.

## 2021-06-16 NOTE — Telephone Encounter (Signed)
Noted, thanks!

## 2021-06-16 NOTE — Telephone Encounter (Signed)
Thank you for the update.  Frustrating waste of a hospital procedure slot.  - HD

## 2021-06-16 NOTE — Telephone Encounter (Signed)
Patients wife called stating that patient does not want to follow through with Colonoscopy tomorrow at [facility]. Wants to cancel. Please advise.

## 2021-06-17 ENCOUNTER — Ambulatory Visit (HOSPITAL_COMMUNITY): Admission: RE | Admit: 2021-06-17 | Payer: Medicare Other | Source: Home / Self Care | Admitting: Gastroenterology

## 2021-06-17 SURGERY — COLONOSCOPY WITH PROPOFOL
Anesthesia: Monitor Anesthesia Care

## 2021-06-21 ENCOUNTER — Encounter: Payer: Self-pay | Admitting: Rehabilitative and Restorative Service Providers"

## 2021-06-21 ENCOUNTER — Ambulatory Visit: Payer: Medicare Other | Attending: Neurology | Admitting: Rehabilitative and Restorative Service Providers"

## 2021-06-21 ENCOUNTER — Other Ambulatory Visit: Payer: Self-pay

## 2021-06-21 DIAGNOSIS — R29898 Other symptoms and signs involving the musculoskeletal system: Secondary | ICD-10-CM | POA: Diagnosis not present

## 2021-06-21 DIAGNOSIS — G8929 Other chronic pain: Secondary | ICD-10-CM | POA: Insufficient documentation

## 2021-06-21 DIAGNOSIS — R519 Headache, unspecified: Secondary | ICD-10-CM | POA: Insufficient documentation

## 2021-06-21 DIAGNOSIS — M6281 Muscle weakness (generalized): Secondary | ICD-10-CM | POA: Diagnosis not present

## 2021-06-21 DIAGNOSIS — R293 Abnormal posture: Secondary | ICD-10-CM | POA: Insufficient documentation

## 2021-06-21 NOTE — Progress Notes (Signed)
Troy Banks to contact pt - for ROV please

## 2021-06-21 NOTE — Therapy (Signed)
[facility] [address], [place], [place], [address] Phone: [phone]   Fax:  [phone]  Physical Therapy Treatment and Discharge Summary  Patient Details  Name: Troy Banks MRN: [id number] Date of Birth: [birth year] Referring Provider (PT): [name], Josephina Shih Date: 06/21/2021   PT End of Session - 06/21/21 1231     Visit Number 8    Number of Visits 9    Date for PT Re-Evaluation 06/21/21    Authorization Type [employer] Medicare    PT Start Time 6389    PT Stop Time 1228    PT Time Calculation (min) 34 min    Activity Tolerance Patient tolerated treatment well;No increased pain    Behavior During Therapy [facility] for tasks assessed/performed             Past Medical History:  Diagnosis Date   Allergy    Anxiety    PTSD   Aortic stenosis, moderate 03/27/2015   post AVR echo Echo 11/16: Mild LVH, EF 55-60%, normal wall motion, grade 1 diastolic dysfunction, bioprosthetic AVR okay (mean gradient 18 mmHg) with trivial AI, mild LAE, atrial septal aneurysm, small effusion    Arthritis    "qwhere" (10/29/2015)   Cancer ([employer])    skin cancer   Carotid artery stenosis    Carotid US 11/17: 1-39% bilateral ICA stenosis. F/u prn   Cervical stenosis of spinal canal    fusion 2006   CHF (congestive heart failure) ([place])    "secondry to OHS"   Closed left pilon fracture    COLONIC POLYPS, HX OF    Complication of anesthesia    "takes a lot to put him under and has woken up during surgery" Difficult to wake up . PTSD; "does not metabolize RX well"that he gets "violent", per pt.    Coronary artery disease    DIVERTICULOSIS, COLON    Dysrhythmia    2 bouts of afib post op and at home but corrected with medication.   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 ([place])    confirmed heterozygous 10/24/14 labs   PTSD (post-traumatic stress disorder)    PTSD (post-traumatic stress disorder)    Sleep apnea    On a CPAP    Past Surgical History:  Procedure Laterality Date   ANKLE FUSION Left 03/17/2017   Procedure: FUSION  PILON FRACTURE WITH BONE GRAFTING;  Surgeon: Shona Needles, MD;  Location: [place];  Service: Orthopedics;  Laterality: Left;   ANTERIOR FUSION CERVICAL SPINE  2002   AORTIC VALVE REPLACEMENT N/A 03/30/2015   Procedure: AORTIC VALVE REPLACEMENT (AVR);  Surgeon: Grace Isaac, MD;  Location: [place];  Service: Open Heart Surgery;  Laterality: N/A;   CARDIAC CATHETERIZATION N/A  03/26/2015   Procedure: Left Heart Cath and Coronary Angiography;  Surgeon: Leonie Man, MD;  Location: [place] [facility];  Service: Cardiovascular;  Laterality: N/A;   CARDIAC VALVE REPLACEMENT     COLONOSCOPY W/ POLYPECTOMY     CORONARY ARTERY BYPASS GRAFT N/A 03/30/2015   Procedure: CORONARY ARTERY BYPASS GRAFTING (CABG) x four, using left internal mammary artery and left    leg greater saphenous vein harvested endoscopically ;  Surgeon: Grace Isaac, MD;  Location: [place];  Service: Open Heart Surgery;  Laterality: N/A;   CYSTOSCOPY/URETEROSCOPY/HOLMIUM LASER/STENT PLACEMENT Bilateral 11/07/2016   Procedure: CYSTOSCOPY/URETEROSCOPY/HOLMIUM LASER/STENT PLACEMENT;  Surgeon: Nickie Retort, MD;  Location: [facility];  Service: Urology;  Laterality: Bilateral;   CYSTOSCOPY/URETEROSCOPY/HOLMIUM LASER/STENT PLACEMENT Bilateral 11/23/2016   Procedure: CYSTOSCOPY/BILATERAL  URETEROSCOPY/HOLMIUM LASER/STENT EXCHANGE;  Surgeon: Nickie Retort, MD;  Location: [facility];  Service: Urology;  Laterality: Bilateral;  NEEDS 90 MIN    EXTERNAL FIXATION LEG Left 02/22/2017   Procedure: EXTERNAL FIXATION LEFT ANKLE;  Surgeon: Renette Butters, MD;  Location: [place];  Service: Orthopedics;  Laterality: Left;   HEMORRHOID BANDING  1970s   INGUINAL HERNIA REPAIR Left [place]   "cut me open"   LEFT HEART CATH AND CORS/GRAFTS ANGIOGRAPHY N/A 10/25/2018   Procedure: LEFT HEART CATH AND CORS/GRAFTS ANGIOGRAPHY;  Surgeon: Leonie Man, MD;  Location: [place] [facility];  Service: Cardiovascular;  Laterality: N/A;   POSTERIOR FUSION CERVICAL SPINE  2006   TEE WITHOUT CARDIOVERSION N/A 03/30/2015   Procedure: TRANSESOPHAGEAL ECHOCARDIOGRAM (TEE);  Surgeon: Grace Isaac, MD;  Location: [place];  Service: Open Heart Surgery;  Laterality: N/A;   TESTICLE TORSION REDUCTION Right 1960   TOE FUSION Left 1985 & 2004   "great toe" and removal   TONSILLECTOMY     TOTAL HIP ARTHROPLASTY Right 10/27/2015   Procedure: RIGHT TOTAL HIP ARTHROPLASTY ANTERIOR APPROACH and steroid injection right foot;  Surgeon: Mcarthur Rossetti, MD;  Location: [place];  Service: Orthopedics;  Laterality: Right;   TOTAL HIP ARTHROPLASTY Left 08/13/2019   TOTAL HIP ARTHROPLASTY Left 08/13/2019   Procedure: LEFT TOTAL HIP ARTHROPLASTY ANTERIOR APPROACH;  Surgeon: Mcarthur Rossetti, MD;  Location: [place];  Service: Orthopedics;  Laterality: Left;    There were no vitals filed for this visit.   Subjective Assessment - 06/21/21 1156     Subjective The patient continues with headache 24/7 in the R temporal region.  He also notes occasional blurriness in the R eye. He feels like his MD annual check up was a quick appointment and the patient didn't discuss his depression with the doctor.    Patient Stated Goals Reduce pain.    Currently in Pain? Yes    Pain Score 8     Pain  Location Head    Pain Orientation Right    Pain Descriptors / Indicators Headache    Pain Type Chronic pain    Pain Onset More than a month ago    Pain Frequency Constant    Aggravating Factors  he notes a bad timeframe right now of HA x 7-8 days that is severe    Pain Relieving Factors stretches.                [facility] PT Assessment - 06/21/21 1216       Assessment   Medical Diagnosis intractable migraine    Referring Provider (PT) [name], Wells Guiles    Onset Date/Surgical  Date 05/10/21              Screening for Suicide  Answer the following questions with Yes or No and place an "x" beside the action taken.  1. Over the past two weeks, have you felt down, depressed, or hopeless?   Yes  2. Within the past two weeks, have you felt little interest or pleasure in life?   Yes  If YES to either #1 or #2, then ask #3  3. Have you had thoughts that that life is not worth living or that you might be       better off dead?   Yes--notes he feels like he "needs to get the hell out of the situation"  If answer is NO and suspicion is low, then end   4. Over this past week, have you had any thoughts about hurting or even killing yourself?   No.  However, "it's always an option".  He states he doesn't consciously think about it but it's always there.  Patient's father committed suicide when he was 62 (patient was 81 years old).  If NO, then end. Patient in no immediate danger   5. If so, do you believe that you intend to or will harm yourself?       If NO, then end. Patient in no immediate danger   6.  Do you have a plan as to how you would hurt yourself?     7.  Over this past week, have you actually done anything to hurt yourself?    IF YES answers to either #4, #5, #6 or #7, then patient is AT RISK for suicide   Actions Taken  ____  Screening negative; no further action required  __X__  Screening positive; no immediate danger and patient already in treatment with  a  mental health provider. Advise patient to speak to their mental health provider.  ____  Screening positive; no immediate danger. Patient advised to contact a mental  health provider for further assessment.   ____  Screening positive; in immediate danger as patient states intention of killing self,  has plan and a sense of imminence. Do not leave alone. Seek permission from  patient to contact a family member to inform them. Direct patient to go to ED.              [facility] Adult PT Treatment/Exercise - 06/21/21 1216       Self-Care   Self-Care Other Self-Care Comments    Other Self-Care Comments  discussed mental health, chronic pain, and home walking program      Exercises   Exercises Neck;Shoulder      Manual Therapy   Manual Therapy Soft tissue mobilization    Soft tissue mobilization soft tissue mobilization L parascapular region.                     PT Education - 06/21/21 1225     Education Details HEP--added one further stretch for anterior chest    Person(s) Educated Patient    Methods Explanation;Demonstration;Handout    Comprehension Verbalized understanding;Returned demonstration                 PT Long Term Goals - 06/04/21 1020       PT LONG TERM GOAL #1   Title Pt will be independent with HEP for improved flexibility, posture, balance, and decreased pain.  TARGET 06/04/2021    Time 4    Period [place]  Status Achieved      PT LONG TERM GOAL #2   Title Pt will improve pain to less than or equal to 2/10 for daily activities and funcitonal mobility.    Baseline 5/10 at eval; 8/10 today reporting "more tension"    Time 4    Period Weeks    Status Not Met      PT LONG TERM GOAL #3   Title Pt will improve neck flexibility in lateral flexion by 5 degrees each direction, for improved flexibility and to demonstrate decreased pain with daily activities.    Baseline R lateral flexion 20 degrees, L lateral flexion 10 degrees  (R lat  flexion 22 and L 16 deg)    Time 4    Period Weeks    Status Partially Met      PT LONG TERM GOAL #4   Title Pt will improve balance standing on foam surface, eyes closed, at least 15 seconds for improved vestibular system use for balance.    Baseline 6 sec up to 26 seconds on 06/04/21    Time 4    Period Weeks    Status Achieved                   Plan - 06/21/21 1217     Clinical Impression Statement PT and patient planned to check in today and do a renewal for PT to update goals.  When discussing his course of pain and response to exercise, he reports he feels depressin continues to be a main problem and reports "I've even thought about ending it."  PT performed suicide screen and discussed need to return to primary care MD re: ongoing/worsening symptoms of depression.  Pt reports regarding need for continued PT, "I am so depressed, I don't see up, I can only see down."  Due to patient continuing with chronic, ongoing HA that varies in intensity (initially showed some response/improvement with PT, but not ongoing), I recommended he f/u with MD instead of continuing to schedule further PT.    PT Frequency 1x / week    PT Treatment/Interventions ADLs/Self Care Home Management;Electrical Stimulation;Traction;Moist Heat;Functional mobility training;Therapeutic activities;Therapeutic exercise;Balance training;Neuromuscular re-education;Manual techniques;Patient/family education;Passive range of motion;Dry needling    PT Next Visit Plan Discharge today-- recommend return to MD for discussion/mgmt of worsening depression symptoms and feelings of hopelessness    Consulted and Agree with Plan of Care Patient             Patient will benefit from skilled therapeutic intervention in order to improve the following deficits and impairments:     Visit Diagnosis: Muscle weakness (generalized)  Abnormal posture  Other symptoms and signs involving the musculoskeletal system  Chronic  intractable headache, unspecified headache type     Problem List Patient Active Problem List   Diagnosis Date Noted   Vitamin D deficiency 06/13/2021   Encounter for fitting and adjustment of hearing aid 11/26/2020   Encounter for fitting and adjustment of spectacles and contact lenses 11/26/2020   DNR (do not resuscitate) discussion 11/26/2020   Aortic atherosclerosis ([place]) 11/26/2020   Allergic rhinitis 10/05/2020   Cough 10/05/2020   Sensorineural hearing loss, bilateral 09/29/2020   Hallux rigidus 09/23/2020   Retinoschisis 09/23/2020   RUQ pain 06/10/2020   History of lacunar cerebrovascular accident 04/05/2020   Headache 03/16/2020   Ankle pain 01/22/2020   Hypothyroidism 11/26/2019   Epididymitis 08/28/2019   Unilateral primary osteoarthritis, left hip 08/13/2019   Status post total replacement of  left hip 08/13/2019   Abdominal wall hernia 11/02/2018   Progressive angina ([place]) 10/25/2018   DOE (dyspnea on exertion) 10/25/2018   Abscess of right buttock 11/27/2017   Acute sinus infection 11/05/2017   Pilon fracture 03/17/2017   Pilon fracture of left tibia 03/17/2017   At risk for adverse drug reaction 02/28/2017   Closed fracture dislocation of ankle joint, left, with delayed healing, subsequent encounter 02/22/2017   Renal stone 10/26/2016   Gross hematuria 10/26/2016   Bladder neck obstruction 10/26/2016   External hemorrhoid, thrombosed 06/10/2016   Internal hemorrhoids 06/10/2016   Lumbar back pain 05/19/2016   Osteoarthritis of right hip 10/27/2015   Encounter for well adult exam with abnormal findings 10/27/2015   Chronic chest pain 09/22/2015   Acute gouty arthritis 06/11/2015   Coronary artery disease involving native coronary artery with angina pectoris ([place]) 06/11/2015   Chronic diastolic CHF (congestive heart failure) ([place]) 06/11/2015   H/O tissue AVR Oct 2016 06/10/2015   PAF post CABG/AVR- Amiodarone 06/10/2015   Dyslipidemia 06/10/2015   S/P  CABG x 4-Oct 2016 03/30/2015   H/O NSTEMI-Oct 2016 03/27/2015   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 ([place])    Depression 09/08/2014   GERD (gastroesophageal reflux disease)    Essential tremor 03/31/2014   Iliotibial band syndrome affecting left lower leg 01/01/2014   Spondylolisthesis at L4-L5 level 01/01/2014   Sinus bradycardia 12/31/2013   Greater trochanteric bursitis of right hip 12/04/2013   OSA on CPAP    Diet-controlled diabetes mellitus ([place]) 07/08/2009   COLONIC POLYPS, HX OF 12/31/2008   GOUT 12/22/2008   Essential hypertension 12/22/2008   Diverticulosis of colon 12/22/2008   NEPHROLITHIASIS, HX OF 12/22/2008    PHYSICAL THERAPY DISCHARGE SUMMARY  Visits from Start of Care: 8  Current functional level related to goals / functional outcomes: See goals above-- pt continues with daily HA.  He does have times of reduced HA, but notes typical pattern is 3 weeks out of the month he has HA.   Remaining deficits: Continued chronic pain Reports of worsening depression   Education / Equipment: HEP, need to f/u with MD for further assessment of depression.   Patient agrees to discharge. Patient goals were not met. Patient is being discharged due to lack of progress.   [place], PT 06/21/2021, 12:38 PM  [facility] [address] [place], [place], [address] Phone: [phone]   Fax:  [phone]  Name: ERASTUS BARTOLOMEI MRN: [id number] Date of Birth: [birth year]

## 2021-06-21 NOTE — Patient Instructions (Signed)
Access Code: [email or web address] URL: [email or web address] Date: 06/21/2021 Prepared by: Rudell Cobb  Program Notes Standing ball against the wall by the shoulder blade to massage tight muscles.   Exercises Supine Cervical Retraction with Towel - 1-2 x daily - 7 x weekly - 2 sets - 5 reps - 3-5 sec hold Sidelying Thoracic Rotation with Open Book - 2 x daily - 7 x weekly - 1 sets - 10 reps Shoulder External Rotation and Scapular Retraction - 2 x daily - 7 x weekly - 1 sets - 12 reps - 3-5 seconds hold Standing Shoulder W at Wall - 2 x daily - 7 x weekly - 1 sets - 12 reps - 3-5 seconds hold Shoulder Rolls in Sitting - 2 x daily - 7 x weekly - 1 sets - 10 reps Standing Isometric Cervical Retraction with Chin Tucks and Ball at [employer] - 1-2 x daily - 5 x weekly - 1 sets - 10 reps - 3-5 hold Doorway Pec Stretch at 60 Elevation - 1 x daily - 5 x weekly - 1 sets - 5 reps - 15 sec hold Standing Pec Stretch at Wall - 2 x daily - 7 x weekly - 1 sets - 2 reps - 20-30 seconds hold

## 2021-06-28 ENCOUNTER — Other Ambulatory Visit: Payer: Self-pay | Admitting: Internal Medicine

## 2021-06-28 NOTE — Telephone Encounter (Signed)
Please refill as per office routine med refill policy (all routine meds to be refilled for 3 mo or monthly (per pt preference) up to one year from last visit, then month to month grace period for 3 mo, then further med refills will have to be denied) ? ?

## 2021-07-07 ENCOUNTER — Other Ambulatory Visit: Payer: Self-pay | Admitting: Internal Medicine

## 2021-07-07 NOTE — Telephone Encounter (Signed)
Please refill as per office routine med refill policy (all routine meds to be refilled for 3 mo or monthly (per pt preference) up to one year from last visit, then month to month grace period for 3 mo, then further med refills will have to be denied) ? ?

## 2021-07-16 ENCOUNTER — Ambulatory Visit: Payer: Medicare Other | Admitting: Neurology

## 2021-07-20 ENCOUNTER — Other Ambulatory Visit: Payer: Self-pay | Admitting: Internal Medicine

## 2021-07-20 ENCOUNTER — Encounter: Payer: Self-pay | Admitting: Internal Medicine

## 2021-07-20 DIAGNOSIS — Z1589 Genetic susceptibility to other disease: Secondary | ICD-10-CM

## 2021-07-27 ENCOUNTER — Telehealth: Payer: Self-pay | Admitting: Genetic Counselor

## 2021-07-27 NOTE — Telephone Encounter (Signed)
Scheduled appt per 2/16 referral. Pt is aware of appt date and time. Pt is aware to arrive 15 mins prior to appt time and to bring and updated insurance card. Pt is aware of appt location.   °

## 2021-08-06 ENCOUNTER — Telehealth: Payer: Self-pay | Admitting: Genetic Counselor

## 2021-08-06 NOTE — Telephone Encounter (Signed)
Cancelled pt's genetic counseling appt due to 3/3 secure chat with Mel Almond Flippin. Per Mel Almond, this is not an appropriate referral for our office. Msg sent to referring provider to let them know.  ?

## 2021-08-10 ENCOUNTER — Encounter: Payer: Self-pay | Admitting: Podiatry

## 2021-08-10 ENCOUNTER — Other Ambulatory Visit: Payer: Self-pay

## 2021-08-10 ENCOUNTER — Ambulatory Visit: Payer: Medicare Other | Admitting: Podiatry

## 2021-08-10 DIAGNOSIS — E119 Type 2 diabetes mellitus without complications: Secondary | ICD-10-CM

## 2021-08-10 DIAGNOSIS — B351 Tinea unguium: Secondary | ICD-10-CM

## 2021-08-10 DIAGNOSIS — M79674 Pain in right toe(s): Secondary | ICD-10-CM | POA: Diagnosis not present

## 2021-08-10 DIAGNOSIS — M79675 Pain in left toe(s): Secondary | ICD-10-CM

## 2021-08-10 NOTE — Progress Notes (Signed)
This patient returns to the office for evaluation and treatment of long thick painful nails .  This patient is unable to trim his own nails since the patient cannot reach the feet.  Patient says the nails are painful walking and wearing his shoes.  He returns for preventive foot care services.   ? ?General Appearance  Alert, conversant and in no acute stress. ? ?Vascular  Dorsalis pedis and posterior tibial  pulses are palpable  bilaterally.  Capillary return is within normal limits  bilaterally. Temperature is within normal limits  bilaterally. ? ?Neurologic  Senn-Weinstein monofilament wire test within normal limits  bilaterally. Muscle power within normal limits bilaterally. ? ?Nails Thick disfigured discolored nails with subungual debris  from hallux to fifth toes bilaterally. No evidence of bacterial infection or drainage bilaterally. ? ?Orthopedic  No limitations of motion  feet .  No crepitus or effusions noted.  No bony pathology or digital deformities noted.  Ankle/STJ immobile left foot/ankle.  Fused 1st MPJ ight foot. ? ?Skin  normotropic skin with no porokeratosis noted bilaterally.  No signs of infections or ulcers noted.    ? ?Onychomycosis  Pain in toes right foot  Pain in toes left foot ? ?Debridement  of nails  1-5  B/L with a nail nipper.  Nails were then filed using a dremel tool with no incidents.    RTC  3 month ? ? ?Gardiner Barefoot DPM ?

## 2021-08-14 ENCOUNTER — Other Ambulatory Visit: Payer: Self-pay | Admitting: Internal Medicine

## 2021-08-14 NOTE — Telephone Encounter (Signed)
Please refill as per office routine med refill policy (all routine meds to be refilled for 3 mo or monthly (per pt preference) up to one year from last visit, then month to month grace period for 3 mo, then further med refills will have to be denied) ? ?

## 2021-08-17 ENCOUNTER — Encounter: Payer: Self-pay | Admitting: Internal Medicine

## 2021-08-24 ENCOUNTER — Other Ambulatory Visit: Payer: Medicare Other

## 2021-08-24 ENCOUNTER — Encounter: Payer: Medicare Other | Admitting: Genetic Counselor

## 2021-08-24 NOTE — Telephone Encounter (Signed)
Sorry, I dont have anything else to offer for this  ?

## 2021-08-27 ENCOUNTER — Telehealth: Payer: Self-pay | Admitting: Internal Medicine

## 2021-08-27 NOTE — Telephone Encounter (Signed)
LVM for pt infomring him that his appt time with the [facility] on 08/30/21 has been changed to    1:30 pm from 1:45 pm  ?

## 2021-08-30 ENCOUNTER — Ambulatory Visit (INDEPENDENT_AMBULATORY_CARE_PROVIDER_SITE_OTHER): Payer: Medicare Other

## 2021-08-30 DIAGNOSIS — Z0000 Encounter for general adult medical examination without abnormal findings: Secondary | ICD-10-CM

## 2021-08-30 NOTE — Progress Notes (Signed)
? ?Subjective:  ? Troy Banks is a 81 y.o. male who presents for an Subsequent Medicare Annual Wellness Visit. ? ? ?I connected with Check Deardorff today by telephone and verified that I am speaking with the correct person using two identifiers. ?Location patient: home ?Location provider: work ?Persons participating in the virtual visit: patient, provider. ?  ?I discussed the limitations, risks, security and privacy concerns of performing an evaluation and management service by telephone and the availability of in person appointments. I also discussed with the patient that there may be a patient responsible charge related to this service. The patient expressed understanding and verbally consented to this telephonic visit.  ?  ?Interactive audio and video telecommunications were attempted between this provider and patient, however failed, due to patient having technical difficulties OR patient did not have access to video capability.  We continued and completed visit with audio only. ? ?  ?Review of Systems    ? ?Cardiac Risk Factors include: advanced age (>43mn, >>15women);dyslipidemia;male gender;hypertension ? ?   ?Objective:  ?  ?Today's Vitals  ? ?There is no height or weight on file to calculate BMI. ? ? ?  08/30/2021  ?  2:02 PM 05/12/2021  ?  9:37 AM 05/10/2021  ?  8:36 AM 11/30/2020  ?  2:23 PM 08/18/2020  ?  9:07 AM 07/16/2020  ?  3:23 PM 12/17/2019  ?  8:10 AM  ?Advanced Directives  ?Does Patient Have a Medical Advance Directive? Yes Yes Yes Yes Yes Yes Yes  ?Type of [place] will Living will [place] will;Out of facility DNR (pink MOST or yellow form) Living will;Healthcare Power of [place]Living will  ?Does patient want to make changes to medical advance directive?  No - Patient declined  No - Patient declined No - Patient declined     ?Copy of [place] Chart? No - copy requested        ? ? ?Current Medications (verified) ?Outpatient Encounter Medications as of 08/30/2021  ?Medication Sig  ? acetaminophen (TYLENOL) 500 MG tablet Take 1,000 mg by mouth every 6 (six) hours as needed for moderate pain.  ? allopurinol (ZYLOPRIM) 300 MG tablet TAKE 1 TABLET BY MOUTH EVERY DAY  ? amLODipine (NORVASC) 10 MG tablet TAKE 1 TABLET BY MOUTH EVERY DAY  ? Ascorbic Acid (VITAMIN C) 1000 MG tablet Take 1,000 mg by mouth daily.  ? aspirin EC 81 MG tablet Take 81 mg by mouth every other day. Swallow whole.  ? calcium carbonate (TUMS EX) 750 MG chewable tablet Chew 1 tablet by mouth daily as needed for heartburn.   ? Carboxymethylcellulose Sodium (LUBRICANT EYE DROPS OP) Place 1 drop into both eyes 2 (two) times daily as needed (dry/irritated eyes.).   ? Cholecalciferol (VITAMIN D-3) 25 MCG (1000 UT) CAPS Take 1,000 Units by mouth daily.  ? famotidine (PEPCID) 40 MG tablet TAKE 1 TABLET BY MOUTH EVERY DAY  ? furosemide (LASIX) 40 MG tablet TAKE 1 TABLET BY MOUTH EVERY OTHER DAY  ? gabapentin (NEURONTIN) 300 MG capsule Take 1 capsule (300 mg total) by mouth at bedtime.  ? levothyroxine (SYNTHROID) 25 MCG tablet TAKE 1 TABLET BY MOUTH EVERY DAY WITH BREAKFAST  ? nitroGLYCERIN (NITROSTAT) 0.4 MG SL tablet PLACE 1 TABLET UNDER THE TONGUE EVERY 5 MINUTES AS NEEDED FOR CHEST PAIN. (Patient taking differently: Place 0.4 mg under  the tongue every 5 (five) minutes as needed for chest pain.)  ? PEG-KCl-NaCl-NaSulf-Na Asc-C (PLENVU) 140 g SOLR Take 140 g by mouth as directed.  ? primidone (MYSOLINE) 50 MG tablet Take 1 tablet (50 mg total) by mouth 2 (two) times daily.  ? rosuvastatin (CRESTOR) 10 MG tablet TAKE 1/2 TABLET BY MOUTH EVERY DAY  ? tiZANidine (ZANAFLEX) 4 MG tablet Take 1 tablet by mouth every 8 (eight) hours as needed for muscle spasms.  ? Turmeric 500 MG CAPS Take 500 mg by mouth daily.   ? vitamin B-12 (CYANOCOBALAMIN) 1000 MCG tablet  Take 1,000 mcg by mouth daily.   ? benzonatate (TESSALON PERLES) 100 MG capsule Take 1 capsule (100 mg total) by mouth 3 (three) times daily as needed for cough. (Patient not taking: Reported on 06/07/2021)  ? [DISCONTINUED] ranitidine (ZANTAC) 150 MG tablet TAKE 1 TABLET BY MOUTH 2 TIMES DAILY (Patient taking differently: No sig reported)  ? ?Facility-Administered Encounter Medications as of 08/30/2021  ?Medication  ? sodium chloride flush (NS) 0.9 % injection 3 mL  ? ? ?Allergies (verified) ?Acyclovir and related, Benzodiazepines, Compazine [prochlorperazine maleate], Coreg [carvedilol], Flomax [tamsulosin hcl], Lisinopril, Mobic [meloxicam], Ondansetron, Oxycodone, Promethazine, Sertraline hcl, Tramadol, Atorvastatin, Colchicine, and Fentanyl  ? ?History: ?Past Medical History:  ?Diagnosis Date  ? Allergy   ? Anxiety   ? PTSD  ? Aortic stenosis, moderate 03/27/2015  ? post AVR echo Echo 11/16: Mild LVH, EF 55-60%, normal wall motion, grade 1 diastolic dysfunction, bioprosthetic AVR okay (mean gradient 18 mmHg) with trivial AI, mild LAE, atrial septal aneurysm, small effusion   ? Arthritis   ? "qwhere" (10/29/2015)  ? Cancer [facility])   ? skin cancer  ? Carotid artery stenosis   ? Carotid US 11/17: 1-39% bilateral ICA stenosis. F/u prn  ? Cervical stenosis of spinal canal   ? fusion 2006  ? CHF (congestive heart failure) ([place])   ? "secondry to [facility]"  ? Closed left pilon fracture   ? COLONIC POLYPS, HX OF   ? Complication of anesthesia   ? "takes a lot to put him under and has woken up during surgery" Difficult to wake up . PTSD; "does not metabolize RX well"that he gets "violent", per pt.   ? Coronary artery disease   ? DIVERTICULOSIS, COLON   ? Dysrhythmia   ? 2 bouts of afib post op and at home but corrected with medication.  ? Familial tremor 03/19/2014  ? Family history of adverse reaction to anesthesia   ? Father - hold to get asleep  ? GERD (gastroesophageal reflux disease)   ? uses Zantac  ? GOUT   ? Headache   ?  Heart murmur   ? had aortic value replacement  ? High cholesterol   ? History of blood transfusion   ? History of kidney stones   ? surgery  ? HYPERTENSION   ? off ACEI 2010 because of hyperkalemia in setting of ARI  ? Hypothyroidism   ? newly diaganosed 09/2015  ? Myocardial infarction [facility])   ? October 20th 2016  ? OSA on CPAP   ? Pneumonia   ? 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 confirmed heterozygous 10/24/14 labs  ? PTSD (post-traumatic stress disorder)   ? PTSD (post-traumatic stress disorder)   ? Sleep apnea   ? On a CPAP  ? ?Past Surgical History:  ?Procedure Laterality Date  ? ANKLE FUSION Left 03/17/2017  ? Procedure: FUSION  PILON FRACTURE WITH BONE GRAFTING;  Surgeon: Shona Needles, MD;  Location: [place];  Service: Orthopedics;  Laterality: Left;  ? ANTERIOR FUSION CERVICAL SPINE  2002  ? AORTIC VALVE REPLACEMENT N/A 03/30/2015  ? Procedure: AORTIC VALVE REPLACEMENT (AVR);  Surgeon: Grace Isaac, MD;  Location: [place];  Service: Open Heart Surgery;  Laterality: N/A;  ? CARDIAC CATHETERIZATION N/A 03/26/2015  ? Procedure: Left Heart Cath and Coronary Angiography;  Surgeon: Leonie Man, MD;  Location: [place] [facility];  Service: Cardiovascular;  Laterality: N/A;  ? CARDIAC VALVE REPLACEMENT    ? COLONOSCOPY W/ POLYPECTOMY    ? CORONARY ARTERY BYPASS GRAFT N/A 03/30/2015  ? Procedure: CORONARY ARTERY BYPASS GRAFTING (CABG) x four, using left internal mammary artery and left    leg greater saphenous vein harvested endoscopically ;  Surgeon: Grace Isaac, MD;  Location: [place];  Service: Open Heart Surgery;  Laterality: N/A;  ? CYSTOSCOPY/URETEROSCOPY/HOLMIUM LASER/STENT PLACEMENT Bilateral 11/07/2016  ? Procedure: CYSTOSCOPY/URETEROSCOPY/HOLMIUM LASER/STENT PLACEMENT;  Surgeon: Nickie Retort, MD;  Location: [facility];  Service: Urology;  Laterality: Bilateral;  ? CYSTOSCOPY/URETEROSCOPY/HOLMIUM LASER/STENT PLACEMENT Bilateral 11/23/2016  ? Procedure:  CYSTOSCOPY/BILATERAL URETEROSCOPY/HOLMIUM LASER/STENT EXCHANGE;  Surgeon: Nickie Retort, MD;  Location: [facility];  Service: Urology;  Laterality: Bilateral;  NEEDS 90 MIN ?  ? EXTERNAL FIXATION LEG Left 9/19/2

## 2021-08-30 NOTE — Patient Instructions (Signed)
Mr. Laskaris , ?Thank you for taking time to come for your Medicare Wellness Visit. I appreciate your ongoing commitment to your health goals. Please review the following plan we discussed and let me know if I can assist you in the future.  ? ?Screening recommendations/referrals: ?Colonoscopy: no longer required  ?Recommended yearly ophthalmology/optometry visit for glaucoma screening and checkup ?Recommended yearly dental visit for hygiene and checkup ? ?Vaccinations: ?Influenza vaccine: declined  ?Pneumococcal vaccine: completed  ?Tdap vaccine: 11/02/2018 ?Shingles vaccine: declined    ? ?Advanced directives: yes  ? ?Conditions/risks identified: none  ? ?Next appointment: none  ? ?Preventive Care 71 Years and Older, Male ?Preventive care refers to lifestyle choices and visits with your health care provider that can promote health and wellness. ?What does preventive care include? ?A yearly physical exam. This is also called an annual well check. ?Dental exams once or twice a year. ?Routine eye exams. Ask your health care provider how often you should have your eyes checked. ?Personal lifestyle choices, including: ?Daily care of your teeth and gums. ?Regular physical activity. ?Eating a healthy diet. ?Avoiding tobacco and drug use. ?Limiting alcohol use. ?Practicing safe sex. ?Taking low doses of aspirin every day. ?Taking vitamin and mineral supplements as recommended by your health care provider. ?What happens during an annual well check? ?The services and screenings done by your health care provider during your annual well check will depend on your age, overall health, lifestyle risk factors, and family history of disease. ?Counseling  ?Your health care provider may ask you questions about your: ?Alcohol use. ?Tobacco use. ?Drug use. ?Emotional well-being. ?Home and relationship well-being. ?Sexual activity. ?Eating habits. ?History of falls. ?Memory and ability to understand (cognition). ?Work and work  Statistician. ?Screening  ?You may have the following tests or measurements: ?Height, weight, and BMI. ?Blood pressure. ?Lipid and cholesterol levels. These may be checked every 5 years, or more frequently if you are over 37 years old. ?Skin check. ?Lung cancer screening. You may have this screening every year starting at age 83 if you have a 30-pack-year history of smoking and currently smoke or have quit within the past 15 years. ?Fecal occult blood test (FOBT) of the stool. You may have this test every year starting at age 22. ?Flexible sigmoidoscopy or colonoscopy. You may have a sigmoidoscopy every 5 years or a colonoscopy every 10 years starting at age 46. ?Prostate cancer screening. Recommendations will vary depending on your family history and other risks. ?Hepatitis C blood test. ?Hepatitis B blood test. ?Sexually transmitted disease (STD) testing. ?Diabetes screening. This is done by checking your blood sugar (glucose) after you have not eaten for a while (fasting). You may have this done every 1-3 years. ?Abdominal aortic aneurysm (AAA) screening. You may need this if you are a current or former smoker. ?Osteoporosis. You may be screened starting at age 47 if you are at high risk. ?Talk with your health care provider about your test results, treatment options, and if necessary, the need for more tests. ?Vaccines  ?Your health care provider may recommend certain vaccines, such as: ?Influenza vaccine. This is recommended every year. ?Tetanus, diphtheria, and acellular pertussis (Tdap, Td) vaccine. You may need a Td booster every 10 years. ?Zoster vaccine. You may need this after age 19. ?Pneumococcal 13-valent conjugate (PCV13) vaccine. One dose is recommended after age 9. ?Pneumococcal polysaccharide (PPSV23) vaccine. One dose is recommended after age 4. ?Talk to your health care provider about which screenings and vaccines you need and how  often you need them. ?This information is not intended to replace  advice given to you by your health care provider. Make sure you discuss any questions you have with your health care provider. ?Document Released: 06/19/2015 Document Revised: 02/10/2016 Document Reviewed: 03/24/2015 ?Elsevier Interactive Patient Education ? 2017 [place]. ? ?Fall Prevention in the Home ?Falls can cause injuries. They can happen to people of all ages. There are many things you can do to make your home safe and to help prevent falls. ?What can I do on the outside of my home? ?Regularly fix the edges of walkways and driveways and fix any cracks. ?Remove anything that might make you trip as you walk through a door, such as a raised step or threshold. ?Trim any bushes or trees on the path to your home. ?Use bright outdoor lighting. ?Clear any walking paths of anything that might make someone trip, such as rocks or tools. ?Regularly check to see if handrails are loose or broken. Make sure that both sides of any steps have handrails. ?Any raised decks and porches should have guardrails on the edges. ?Have any leaves, snow, or ice cleared regularly. ?Use sand or salt on walking paths during winter. ?Clean up any spills in your garage right away. This includes oil or grease spills. ?What can I do in the bathroom? ?Use night lights. ?Install grab bars by the toilet and in the tub and shower. Do not use towel bars as grab bars. ?Use non-skid mats or decals in the tub or shower. ?If you need to sit down in the shower, use a plastic, non-slip stool. ?Keep the floor dry. Clean up any water that spills on the floor as soon as it happens. ?Remove soap buildup in the tub or shower regularly. ?Attach bath mats securely with double-sided non-slip rug tape. ?Do not have throw rugs and other things on the floor that can make you trip. ?What can I do in the bedroom? ?Use night lights. ?Make sure that you have a light by your bed that is easy to reach. ?Do not use any sheets or blankets that are too big for your bed.  They should not hang down onto the floor. ?Have a firm chair that has side arms. You can use this for support while you get dressed. ?Do not have throw rugs and other things on the floor that can make you trip. ?What can I do in the kitchen? ?Clean up any spills right away. ?Avoid walking on wet floors. ?Keep items that you use a lot in easy-to-reach places. ?If you need to reach something above you, use a strong step stool that has a grab bar. ?Keep electrical cords out of the way. ?Do not use floor polish or wax that makes floors slippery. If you must use wax, use non-skid floor wax. ?Do not have throw rugs and other things on the floor that can make you trip. ?What can I do with my stairs? ?Do not leave any items on the stairs. ?Make sure that there are handrails on both sides of the stairs and use them. Fix handrails that are broken or loose. Make sure that handrails are as long as the stairways. ?Check any carpeting to make sure that it is firmly attached to the stairs. Fix any carpet that is loose or worn. ?Avoid having throw rugs at the top or bottom of the stairs. If you do have throw rugs, attach them to the floor with carpet tape. ?Make sure that you have a  light switch at the top of the stairs and the bottom of the stairs. If you do not have them, ask someone to add them for you. ?What else can I do to help prevent falls? ?Wear shoes that: ?Do not have high heels. ?Have rubber bottoms. ?Are comfortable and fit you well. ?Are closed at the toe. Do not wear sandals. ?If you use a stepladder: ?Make sure that it is fully opened. Do not climb a closed stepladder. ?Make sure that both sides of the stepladder are locked into place. ?Ask someone to hold it for you, if possible. ?Clearly mark and make sure that you can see: ?Any grab bars or handrails. ?First and last steps. ?Where the edge of each step is. ?Use tools that help you move around (mobility aids) if they are needed. These  include: ?Canes. ?Walkers. ?Scooters. ?Crutches. ?Turn on the lights when you go into a dark area. Replace any light bulbs as soon as they burn out. ?Set up your furniture so you have a clear path. Avoid moving your furniture around. ?I

## 2021-09-09 ENCOUNTER — Telehealth: Payer: Self-pay | Admitting: Internal Medicine

## 2021-09-09 NOTE — Telephone Encounter (Signed)
Danae Chen called and states they need a reason for referral if for genetics. There was no reason placed on it when sent over. ?   ? ?Fax #- [phone] ?

## 2021-09-09 NOTE — Telephone Encounter (Signed)
Diagnosis Information ? 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 ?

## 2021-09-13 NOTE — Telephone Encounter (Signed)
Dx code given. Per [facility], will contact us if additional info is needed ?

## 2021-09-15 DIAGNOSIS — H04123 Dry eye syndrome of bilateral lacrimal glands: Secondary | ICD-10-CM | POA: Diagnosis not present

## 2021-09-15 DIAGNOSIS — H353131 Nonexudative age-related macular degeneration, bilateral, early dry stage: Secondary | ICD-10-CM | POA: Diagnosis not present

## 2021-09-15 DIAGNOSIS — H0102B Squamous blepharitis left eye, upper and lower eyelids: Secondary | ICD-10-CM | POA: Diagnosis not present

## 2021-09-15 DIAGNOSIS — H25813 Combined forms of age-related cataract, bilateral: Secondary | ICD-10-CM | POA: Diagnosis not present

## 2021-09-30 ENCOUNTER — Ambulatory Visit (INDEPENDENT_AMBULATORY_CARE_PROVIDER_SITE_OTHER): Payer: Medicare Other | Admitting: Orthopaedic Surgery

## 2021-09-30 ENCOUNTER — Ambulatory Visit (INDEPENDENT_AMBULATORY_CARE_PROVIDER_SITE_OTHER): Payer: Medicare Other

## 2021-09-30 ENCOUNTER — Encounter: Payer: Self-pay | Admitting: Orthopaedic Surgery

## 2021-09-30 DIAGNOSIS — M79602 Pain in left arm: Secondary | ICD-10-CM

## 2021-09-30 DIAGNOSIS — M542 Cervicalgia: Secondary | ICD-10-CM

## 2021-09-30 DIAGNOSIS — M7542 Impingement syndrome of left shoulder: Secondary | ICD-10-CM | POA: Diagnosis not present

## 2021-09-30 MED ORDER — METHYLPREDNISOLONE ACETATE 40 MG/ML IJ SUSP
40.0000 mg | INTRAMUSCULAR | Status: AC | PRN
  Administered 2021-09-30: 40 mg via INTRA_ARTICULAR

## 2021-09-30 MED ORDER — PREDNISONE 50 MG PO TABS: ORAL_TABLET | ORAL | 0 refills | Status: DC

## 2021-09-30 MED ORDER — LIDOCAINE HCL 1 % IJ SOLN
3.0000 mL | INTRAMUSCULAR | Status: AC | PRN
  Administered 2021-09-30: 3 mL

## 2021-09-30 NOTE — Progress Notes (Signed)
? ?Office Visit Note ?  ?Patient: Troy Banks           ?Date of Birth: 05-18-1941           ?MRN: [id number] ?Visit Date: 09/30/2021 ?             ?Requested by: [name], MD ?[place],  [place] [address] ?PCP: [name], MD ? ? ?Assessment & Plan: ?Visit Diagnoses:  ?1. Neck pain   ?2. Left arm pain   ?3. Impingement syndrome of left shoulder   ? ? ?Plan: His signs and symptoms are more consistent with left shoulder impingement syndrome.  I did place a steroid injection in the subacromial outlet which she tolerated well.  We will try some oral prednisone as well.  I would like to reevaluate in 4 weeks to see how he is doing overall but no x-rays are needed.  All questions and concerns were answered and addressed. ? ?Follow-Up Instructions: Return in about 4 weeks (around 10/28/2021).  ? ?Orders:  ?Orders Placed This Encounter  ?Procedures  ? Large Joint Inj  ? XR Shoulder Left  ? XR Cervical Spine 2 or 3 views  ? ?Meds ordered this encounter  ?Medications  ? predniSONE (DELTASONE) 50 MG tablet  ?  Sig: Take one tablet daily for 5 days.  ?  Dispense:  5 tablet  ?  Refill:  0  ? ? ? ? Procedures: ?Large Joint Inj: L subacromial bursa on 09/30/2021 3:18 PM ?Indications: pain and diagnostic evaluation ?Details: 22 G 1.5 in needle ? ?Arthrogram: No ? ?Medications: 3 mL lidocaine 1 %; 40 mg methylPREDNISolone acetate 40 MG/ML ?Outcome: tolerated well, no immediate complications ?Procedure, treatment alternatives, risks and benefits explained, specific risks discussed. Consent was given by the patient. Immediately prior to procedure a time out was called to verify the correct patient, procedure, equipment, support staff and site/side marked as required. Patient was prepped and draped in the usual sterile fashion.  ? ? ? ? ?Clinical Data: ?No additional findings. ? ? ?Subjective: ?Chief Complaint  ?Patient presents with  ? Left Elbow - Pain  ?The patient comes in today with a chief complaint of  axial left shoulder pain that radiates down to near the elbow.  He denies any specific injuries.  It hurts with overhead activities and reaching behind him.  He has had a previous neck surgery both anterior and posterior.  He denies any numbness and tingling in his left hand but does report some weakness in the shoulder.  He is not a diabetic. ? ?HPI ? ?Review of Systems ?There is currently listed no fever, chills, nausea, vomiting ? ?Objective: ?Vital Signs: There were no vitals taken for this visit. ? ?Physical Exam ?He is alert and orient x3 and in no acute distress ?Ortho Exam ?Examination of his left shoulder shows a positive Neer and Hawkins signs.  There is some grinding at the [facility] joint but not of the glenohumeral joint.  He does have good strength of the rotator cuff and no decrease in motion of the left shoulder.  There is only slight weakness. ?Specialty Comments:  ?No specialty comments available. ? ?Imaging: ?XR Cervical Spine 2 or 3 views ? ?Result Date: 09/30/2021 ?2 views of the cervical spine show no acute findings.  There is previous hardware from an anterior cervical discectomy and fusion at the mid cervical spine.  The alignment is well-maintained. ? ?XR Shoulder Left ? ?Result Date: 09/30/2021 ?3  views of the left shoulder show no acute findings.  The shoulder is well located.  There is decreased in the subacromial outlet.  ? ? ?PMFS History: ?Patient Active Problem List  ? Diagnosis Date Noted  ? Vitamin D deficiency 06/13/2021  ? Encounter for fitting and adjustment of hearing aid 11/26/2020  ? Encounter for fitting and adjustment of spectacles and contact lenses 11/26/2020  ? DNR (do not resuscitate) discussion 11/26/2020  ? Aortic atherosclerosis ([place]) 11/26/2020  ? Allergic rhinitis 10/05/2020  ? Cough 10/05/2020  ? Sensorineural hearing loss, bilateral 09/29/2020  ? Hallux rigidus 09/23/2020  ? Retinoschisis 09/23/2020  ? RUQ pain 06/10/2020  ? History of lacunar cerebrovascular accident  04/05/2020  ? Headache 03/16/2020  ? Ankle pain 01/22/2020  ? Hypothyroidism 11/26/2019  ? Epididymitis 08/28/2019  ? Unilateral primary osteoarthritis, left hip 08/13/2019  ? Status post total replacement of left hip 08/13/2019  ? Abdominal wall hernia 11/02/2018  ? Progressive angina ([place]) 10/25/2018  ? DOE (dyspnea on exertion) 10/25/2018  ? Abscess of right buttock 11/27/2017  ? Acute sinus infection 11/05/2017  ? Pilon fracture 03/17/2017  ? Pilon fracture of left tibia 03/17/2017  ? At risk for adverse drug reaction 02/28/2017  ? Closed fracture dislocation of ankle joint, left, with delayed healing, subsequent encounter 02/22/2017  ? Renal stone 10/26/2016  ? Gross hematuria 10/26/2016  ? Bladder neck obstruction 10/26/2016  ? External hemorrhoid, thrombosed 06/10/2016  ? Internal hemorrhoids 06/10/2016  ? Lumbar back pain 05/19/2016  ? Osteoarthritis of right hip 10/27/2015  ? Encounter for well adult exam with abnormal findings 10/27/2015  ? Chronic chest pain 09/22/2015  ? Acute gouty arthritis 06/11/2015  ? Coronary artery disease involving native coronary artery with angina pectoris ([place]) 06/11/2015  ? Chronic diastolic CHF (congestive heart failure) ([place]) 06/11/2015  ? H/O tissue AVR Oct 2016 06/10/2015  ? PAF post CABG/AVR- Amiodarone 06/10/2015  ? Dyslipidemia 06/10/2015  ? S/P CABG x 4-Oct 2016 03/30/2015  ? H/O NSTEMI-Oct 2016 03/27/2015  ? 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 Depression 09/08/2014  ? GERD (gastroesophageal reflux disease)   ? Essential tremor 03/31/2014  ? Iliotibial band syndrome affecting left lower leg 01/01/2014  ? Spondylolisthesis at L4-L5 level 01/01/2014  ? Sinus bradycardia 12/31/2013  ? Greater trochanteric bursitis of right hip 12/04/2013  ? OSA on CPAP   ? Diet-controlled diabetes mellitus ([place]) 07/08/2009  ? COLONIC POLYPS, HX OF 12/31/2008  ? GOUT 12/22/2008  ? Essential hypertension 12/22/2008  ? Diverticulosis of colon 12/22/2008  ?  NEPHROLITHIASIS, HX OF 12/22/2008  ? ?Past Medical History:  ?Diagnosis Date  ? Allergy   ? Anxiety   ? PTSD  ? Aortic stenosis, moderate 03/27/2015  ? post AVR echo Echo 11/16: Mild LVH, EF 55-60%, normal wall motion, grade 1 diastolic dysfunction, bioprosthetic AVR okay (mean gradient 18 mmHg) with trivial AI, mild LAE, atrial septal aneurysm, small effusion   ? Arthritis   ? "qwhere" (10/29/2015)  ? Cancer [facility])   ? skin cancer  ? Carotid artery stenosis   ? Carotid US 11/17: 1-39% bilateral ICA stenosis. F/u prn  ? Cervical stenosis of spinal canal   ? fusion 2006  ? CHF (congestive heart failure) ([place])   ? "secondry to OHS"  ? Closed left pilon fracture   ? COLONIC POLYPS, HX OF   ? Complication of anesthesia   ? "takes a lot to put him under and has woken up during  surgery" Difficult to wake up . PTSD; "does not metabolize RX well"that he gets "violent", per pt.   ? Coronary artery disease   ? DIVERTICULOSIS, COLON   ? Dysrhythmia   ? 2 bouts of afib post op and at home but corrected with medication.  ? Familial tremor 03/19/2014  ? Family history of adverse reaction to anesthesia   ? Father - hold to get asleep  ? GERD (gastroesophageal reflux disease)   ? uses Zantac  ? GOUT   ? Headache   ? Heart murmur   ? had aortic value replacement  ? High cholesterol   ? History of blood transfusion   ? History of kidney stones   ? surgery  ? HYPERTENSION   ? off ACEI 2010 because of hyperkalemia in setting of ARI  ? Hypothyroidism   ? newly diaganosed 09/2015  ? Myocardial infarction [facility])   ? October 20th 2016  ? OSA on CPAP   ? Pneumonia   ? 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 confirmed heterozygous 10/24/14 labs  ? PTSD (post-traumatic stress disorder)   ? PTSD (post-traumatic stress disorder)   ? Sleep apnea   ? On a CPAP  ?  ?Family History  ?Problem Relation Age of Onset  ? Heart disease Mother   ? Hypertension Mother   ? Diabetes Mother   ? Heart disease Father   ? Hypertension  Father   ? Tremor Sister   ? Healthy Brother   ? Healthy Son   ? Prostate cancer Maternal Uncle   ? Hypertension Maternal Grandmother   ? Heart disease Maternal Grandmother   ? Hypertension Maternal [place]

## 2021-10-29 ENCOUNTER — Other Ambulatory Visit: Payer: Self-pay | Admitting: Internal Medicine

## 2021-10-29 DIAGNOSIS — I739 Peripheral vascular disease, unspecified: Secondary | ICD-10-CM

## 2021-11-03 ENCOUNTER — Ambulatory Visit: Payer: Medicare Other | Admitting: Orthopaedic Surgery

## 2021-11-03 ENCOUNTER — Encounter: Payer: Self-pay | Admitting: Orthopaedic Surgery

## 2021-11-03 DIAGNOSIS — M7542 Impingement syndrome of left shoulder: Secondary | ICD-10-CM

## 2021-11-03 NOTE — Progress Notes (Signed)
HPI: Mr. Bouillon returns today for follow-up of his shoulder status post injection by Dr. Delilah Shan 09/30/2021.  He states the injection helped about 50% for about 2 to 3 weeks.  Now is having constant aware pain is 4 out of 10 most of the time and 8 out of 10 at worst.  States pain is worse with weather changes and range of motion particularly overhead and reaching behind his back.  Does keep him awake at night.  He has found that many some breathing exercises prior to trying to go to sleep does help.  He has had no formal therapy for the shoulder.  Review of systems: See HPI otherwise negative  Physical exam: General well-developed well-nourished male no acute distress Bilateral shoulders he has 5 5 strength external and internal rotation against resistance.  Empty can test is negative bilaterally.  Impingement testing is negative bilaterally.  Crossover test is negative bilaterally.  Passively and bring his arm fully overhead.  Actively he comes to about 120 degrees with some discomfort.  He also has slight decreased internal rotation of the left shoulder compared to the right with some discomfort with internal rotation left shoulder.  Nontender over the [facility] joint with palpation today.  Impression: Left shoulder pain  Plan: We will send him for an intra-articular injection left shoulder given his decrease of the subacromial outlet on radiograph. Hopefully this will be diagnostic and therapeutic.  Discussed with him forward flexion exercises, pendulum, Codman, and wall crawls.  He will follow-up with [place] 2 weeks after the injury articular injection to see how he responded to the injection.  Questions encouraged and answered.

## 2021-11-05 ENCOUNTER — Other Ambulatory Visit: Payer: Self-pay | Admitting: Neurology

## 2021-11-08 NOTE — Progress Notes (Signed)
Assessment/Plan:    1.  Essential Tremor  -Status post focused ultrasound to the right VIM at [facility] in January, 2022. I do think he is more unbalanced than he was prior to the surgery (discussed this again today).  -continue primidone 50 mg twice per day,    2.  Chronic migraine  -he did not find botox helpful and has stopped that  -increase gabapentin to 300 mg bid.  Consulted pharmD and said okay with cP450 slow metabolism and said okay to take.  -if above not helpful, could consider emgality (this is okay per pharm D) but insurance doesn't pay for this in [facility] population.  We could also try vyepti per pharmacy.  Pt would like to trial the vyepti and we will work on that.    -consider referral to pain clinic for injections if doesn't help or denied  3.  Chronic LBP as well as left shoulder pain  -follows with Dr. Ninfa Linden and having injections with Dr. Ernestina Patches  Subjective:   [name] was seen today in follow up for follow-up chronic migraine, status post Botox on Oct 16, 2020.  Pt with sig other, who supplements hx. last visit, the patient wanted to increase his primidone to twice per day.  We also started low-dose gabapentin last visit for chronic migraine. Pt/wife states that it is there and "bad" daily -  L temporal and goes to L eye and down into the L shoulder.  No emesis but does have nausea.  Pt states that L hand is "always bad" but wife states that this is the hand that was previously done.   Separately, I have been reviewing medical records from Dr. Ninfa Linden, who the patient has been seeing for impingement syndrome.  He is planning on sending him for an intra-articular injection of the left shoulder.    Current prescribed movement disorder medications: Primidone, 50 mg twice per day (increased last visit) Gabapentin, 300 mg at bedtime (started last visit)  PREVIOUS MEDICATIONS: Not beta-blocker candidate because of bradycardia; not topiramate candidate because of  history of nephrolithiasis; primidone  ALLERGIES:   Allergies  Allergen Reactions   Acyclovir And Related Other (See Comments)    Accumulates and causes stroke-like side-effects due to cytochrome P450 enzyme deficiency   Benzodiazepines Other (See Comments)    Patient states he gets stroke symptoms with benzo's.    Compazine [Prochlorperazine Maleate] Other (See Comments)    Accumulates and causes stroke-like side-effects due to cytochrome P450 enzyme deficiency   Coreg [Carvedilol] Other (See Comments)    Accumulates and causes stroke-like side-effects due to cytochrome P450 enzyme deficiency Patient poor metabolizer of CYP2D6 - Coreg undergoes extensive hepatic (including 2D6)   Flomax [Tamsulosin Hcl] Other (See Comments)    Accumulates and causes stroke-like side-effects due to cytochrome P450 enzyme deficiency   Lisinopril Other (See Comments)    Hyperkalemia. 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   Mobic [Meloxicam] Other (See Comments)    Accumulates and causes stroke-like side-effects due to cytochrome P450 enzyme deficiency   Ondansetron Other (See Comments)    Accumulates and causes stroke-like side-effects due to cytochrome P450 enzyme deficiency    Oxycodone Other (See Comments)    Accumulates and causes stroke-like side-effects due to cytochrome P450 enzyme deficiency   Promethazine Other (See Comments)    Accumulates and causes stroke-like side-effects due to cytochrome P450 enzyme deficiency   Sertraline Hcl Other (See Comments)    Accumulates and  causes stroke-like side-effects due to cytochrome P450 enzyme deficiency   Tramadol Other (See Comments)    Accumulates and causes stroke-like side-effects due to cytochrome P450 enzyme deficiency   Atorvastatin Other (See Comments)    MYALGIAS   Colchicine Other (See Comments)    ataxia   Fentanyl Other (See Comments)    Accumulates and cause stroke like symptoms     CURRENT MEDICATIONS:  Outpatient Encounter Medications as of 11/11/2021  Medication Sig   acetaminophen (TYLENOL) 500 MG tablet Take 1,000 mg by mouth every 6 (six) hours as needed for moderate pain.   allopurinol (ZYLOPRIM) 300 MG tablet TAKE 1 TABLET BY MOUTH EVERY DAY   amLODipine (NORVASC) 10 MG tablet TAKE 1 TABLET BY MOUTH EVERY DAY   Ascorbic Acid (VITAMIN C) 1000 MG tablet Take 1,000 mg by mouth daily.   aspirin EC 81 MG tablet Take 81 mg by mouth every other day. Swallow whole.   calcium carbonate (TUMS EX) 750 MG chewable tablet Chew 1 tablet by mouth daily as needed for heartburn.    Carboxymethylcellulose Sodium (LUBRICANT EYE DROPS OP) Place 1 drop into both eyes 2 (two) times daily as needed (dry/irritated eyes.).    Cholecalciferol (VITAMIN D-3) 25 MCG (1000 UT) CAPS Take 1,000 Units by mouth every other day.   famotidine (PEPCID) 40 MG tablet TAKE 1 TABLET BY MOUTH EVERY DAY   furosemide (LASIX) 40 MG tablet TAKE 1 TABLET BY MOUTH EVERY OTHER DAY   gabapentin (NEURONTIN) 300 MG capsule TAKE 1 CAPSULE BY MOUTH AT BEDTIME   levothyroxine (SYNTHROID) 25 MCG tablet TAKE 1 TABLET BY MOUTH EVERY DAY WITH BREAKFAST   nitroGLYCERIN (NITROSTAT) 0.4 MG SL tablet PLACE 1 TABLET UNDER THE TONGUE EVERY 5 MINUTES AS NEEDED FOR CHEST PAIN. (Patient taking differently: Place 0.4 mg under the tongue every 5 (five) minutes as needed for chest pain.)   primidone (MYSOLINE) 50 MG tablet Take 1 tablet (50 mg total) by mouth 2 (two) times daily.   rosuvastatin (CRESTOR) 10 MG tablet TAKE 1/2 TABLET BY MOUTH EVERY DAY   tiZANidine (ZANAFLEX) 4 MG tablet Take 1 tablet by mouth every 8 (eight) hours as needed for muscle spasms.   Turmeric 500 MG CAPS Take 500 mg by mouth daily.    vitamin B-12 (CYANOCOBALAMIN) 1000 MCG tablet Take 1,000 mcg by mouth every other day.   [DISCONTINUED] gabapentin (NEURONTIN) 300 MG capsule Take 1 capsule (300 mg total) by mouth at bedtime.   [DISCONTINUED]  PEG-KCl-NaCl-NaSulf-Na Asc-C (PLENVU) 140 g SOLR Take 140 g by mouth as directed.   [DISCONTINUED] predniSONE (DELTASONE) 50 MG tablet Take one tablet daily for 5 days.   [DISCONTINUED] ranitidine (ZANTAC) 150 MG tablet TAKE 1 TABLET BY MOUTH 2 TIMES DAILY (Patient taking differently: No sig reported)   Facility-Administered Encounter Medications as of 11/11/2021  Medication   sodium chloride flush (NS) 0.9 % injection 3 mL     Objective:    PHYSICAL EXAMINATION:    VITALS:   Vitals:   11/11/21 0900  BP: (!) 162/78  Pulse: 60  SpO2: 95%  Weight: 213 lb (96.6 kg)  Height: '5\' 8"'$  (1.727 m)      GEN:  The patient appears stated age and is in NAD. HEENT:  Normocephalic, atraumatic.  The mucous membranes are moist.   Neurological examination:  Orientation: The patient is alert and oriented x3. Cranial nerves: There is good facial symmetry. The speech is fluent and clear. Soft palate rises symmetrically and there is  no tongue deviation. Hearing is intact to conversational tone. Sensation: Sensation is intact to light touch throughout Motor: Strength is at least antigravity x4.  Movement examination: Tone: There is normal tone in the UE/LE Abnormal movements: There is mild postural tremor bilaterally, R>L.  Mild tremor with archimedes spirals on the R Coordination:  There is no decremation with RAM's,  Gait and Station: The patient is wide-based and slightly unbalanced    Chemistry      Component Value Date/Time   NA 140 06/09/2021 1406   NA 143 10/19/2018 1411   K 4.1 06/09/2021 1406   CL 105 06/09/2021 1406   CO2 26 06/09/2021 1406   BUN 18 06/09/2021 1406   BUN 17 10/19/2018 1411   CREATININE 1.00 06/09/2021 1406   CREATININE 0.94 03/04/2016 0918      Component Value Date/Time   CALCIUM 9.6 06/09/2021 1406   ALKPHOS 55 06/09/2021 1406   AST 23 06/09/2021 1406   ALT 29 06/09/2021 1406   BILITOT 1.2 06/09/2021 1406      Lab Results  Component Value Date   WBC  8.3 06/09/2021   HGB 15.7 06/09/2021   HCT 46.9 06/09/2021   MCV 93.2 06/09/2021   PLT 167.0 06/09/2021   Lab Results  Component Value Date   TSH 2.59 06/09/2021     Chemistry      Component Value Date/Time   NA 140 06/09/2021 1406   NA 143 10/19/2018 1411   K 4.1 06/09/2021 1406   CL 105 06/09/2021 1406   CO2 26 06/09/2021 1406   BUN 18 06/09/2021 1406   BUN 17 10/19/2018 1411   CREATININE 1.00 06/09/2021 1406   CREATININE 0.94 03/04/2016 0918      Component Value Date/Time   CALCIUM 9.6 06/09/2021 1406   ALKPHOS 55 06/09/2021 1406   AST 23 06/09/2021 1406   ALT 29 06/09/2021 1406   BILITOT 1.2 06/09/2021 1406      Lab Results  Component Value Date   ESRSEDRATE 4 07/22/2020      Total time spent on today's visit was 20 minutes, including both face-to-face time and nonface-to-face time.  Time included that spent on review of records (prior notes available to me/labs/imaging if pertinent), discussing treatment and goals, answering patient's questions and coordinating care.  Cc:  [name], MD

## 2021-11-09 ENCOUNTER — Other Ambulatory Visit (HOSPITAL_COMMUNITY): Payer: Self-pay

## 2021-11-10 ENCOUNTER — Ambulatory Visit: Payer: Medicare Other | Admitting: Podiatry

## 2021-11-10 ENCOUNTER — Ambulatory Visit: Payer: Medicare Other | Admitting: Neurology

## 2021-11-10 ENCOUNTER — Encounter: Payer: Self-pay | Admitting: Podiatry

## 2021-11-10 DIAGNOSIS — M79675 Pain in left toe(s): Secondary | ICD-10-CM

## 2021-11-10 DIAGNOSIS — E119 Type 2 diabetes mellitus without complications: Secondary | ICD-10-CM | POA: Diagnosis not present

## 2021-11-10 DIAGNOSIS — B351 Tinea unguium: Secondary | ICD-10-CM

## 2021-11-10 DIAGNOSIS — M79674 Pain in right toe(s): Secondary | ICD-10-CM

## 2021-11-10 NOTE — Progress Notes (Signed)

## 2021-11-11 ENCOUNTER — Telehealth: Payer: Self-pay | Admitting: Pharmacy Technician

## 2021-11-11 ENCOUNTER — Encounter: Payer: Self-pay | Admitting: Neurology

## 2021-11-11 ENCOUNTER — Ambulatory Visit: Payer: Medicare Other | Admitting: Neurology

## 2021-11-11 VITALS — BP 162/78 | HR 60 | Ht 68.0 in | Wt 213.0 lb

## 2021-11-11 DIAGNOSIS — G250 Essential tremor: Secondary | ICD-10-CM

## 2021-11-11 DIAGNOSIS — G43719 Chronic migraine without aura, intractable, without status migrainosus: Secondary | ICD-10-CM | POA: Insufficient documentation

## 2021-11-11 MED ORDER — GABAPENTIN 300 MG PO CAPS: 300.0000 mg | ORAL_CAPSULE | Freq: Two times a day (BID) | ORAL | 2 refills | Status: DC

## 2021-11-11 NOTE — Telephone Encounter (Signed)
Dr. Reather Laurence note:  Auth Submission: no auth needed Payer: [employer] MEDICARE Medication & CPT/J Code(s) submitted: Vyepti (Eptinezumab) [phone] Route of submission (phone, fax, portal): PORTAL Auth type: Buy/Bill Units/visits requested: '100MG'$  Q3MO Reference number: [id number] Approval from: 11/11/21 to 11/12/22   Patient will be scheduled as soon as possible.  Troy Banks

## 2021-11-11 NOTE — Addendum Note (Signed)
Addended by: Jake Seats on: 11/11/2021 09:56 AM   Modules accepted: Orders

## 2021-11-13 ENCOUNTER — Other Ambulatory Visit: Payer: Self-pay | Admitting: Cardiology

## 2021-11-15 NOTE — Progress Notes (Signed)
11/18/2021 Troy Banks   1941/02/28  [id number]  Primary Physician [name], MD Primary Cardiologist: Dr Vania Rosero [name]  HPI:  81 y.o. Male seen for follow up CAD. He was admitted 10/19-10/31/16 with a non-STEMI. LHC demonstrated 3 vessel CAD. He also had significant aortic stenosis.  He underwent CABG with LIMA-LAD, SVG-intermediate, SVG-LCx, SVG-distal RCA and bioprosthetic AVR.  Atenolol was initiated but later stopped secondary to bradycardia. He did develop atrial fibrillation. He was placed on amiodarone with return to NSR.  He has an essential tremor and is on Primidone chronically. 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  Amiodarone was stopped in January 2017 at the time he was hospitalized for an acute gout attack in his [place]. He has maintained NSR.  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 He has a daughter who is a family physician in [place].  In May 2018 he complained of more dyspnea on exertion and some chest tightness.  CXR was clear. Myoview study was normal.  In June 2018 he underwent cystoscopy and lithotripsy for renal stones.   In September 2018 he fell off his roof fracturing his left ankle and had a compression fracture of L2. He required extensive surgery of his ankle.   In May 2020 he presented with symptoms of increased DOE. He underwent cardiac cath showing all grafts were patent. Echo showed normal EF with evidence of diastolic dysfunction. The AV prothesis function had not changed. He had mild pulmonary HTN. His diuretic dose was increased.  He did undergo left THR in March 2022.   On follow up today he is doing well. One month ago he did have a couple of episodes of chest pain relieved with sl Ntg. None since then. His breathing is doing well. He denies any chest pain or palpitations. He is seeing Dr [name] for chronic HA. Being considered for a new infusion. There is a test noted in chart  of an arterial doppler study done in April showing reduced flow in the left leg. He and his wife say he never had this test done. He does have chronic left shin pain but no calf or thigh pain.   Current Outpatient Medications  Medication Sig Dispense Refill   acetaminophen (TYLENOL) 500 MG tablet Take 1,000 mg by mouth every 6 (six) hours as needed for moderate pain.     allopurinol (ZYLOPRIM) 300 MG tablet TAKE 1 TABLET BY MOUTH EVERY DAY 90 tablet 3   amLODipine (NORVASC) 10 MG tablet TAKE 1 TABLET BY MOUTH EVERY DAY 90 tablet 3   Ascorbic Acid (VITAMIN C) 1000 MG tablet Take 1,000 mg by mouth daily.     aspirin EC 81 MG tablet Take 81 mg by mouth every other day. Swallow whole.     calcium carbonate (TUMS EX) 750 MG chewable tablet Chew 1 tablet by mouth daily as needed for heartburn.      Carboxymethylcellulose Sodium (LUBRICANT EYE DROPS OP) Place 1 drop into both eyes 2 (two) times daily as needed (dry/irritated eyes.).      Cholecalciferol (VITAMIN D-3) 25 MCG (1000 UT) CAPS Take 1,000 Units by mouth every other day.     famotidine (PEPCID) 40 MG tablet TAKE 1 TABLET BY MOUTH EVERY DAY 90 tablet 2   furosemide (LASIX) 40 MG tablet TAKE 1 TABLET BY MOUTH EVERY OTHER DAY 45 tablet 2   gabapentin (NEURONTIN) 300 MG capsule Take 1  capsule (300 mg total) by mouth 2 (two) times daily. 180 capsule 2   levothyroxine (SYNTHROID) 25 MCG tablet TAKE 1 TABLET BY MOUTH EVERY DAY WITH BREAKFAST 90 tablet 3   nitroGLYCERIN (NITROSTAT) 0.4 MG SL tablet PLACE 1 TABLET UNDER THE TONGUE EVERY 5 MINUTES AS NEEDED FOR CHEST PAIN. (Patient taking differently: Place 0.4 mg under the tongue every 5 (five) minutes as needed for chest pain.) 25 tablet 3   primidone (MYSOLINE) 50 MG tablet Take 1 tablet (50 mg total) by mouth 2 (two) times daily. 180 tablet 2   rosuvastatin (CRESTOR) 10 MG tablet TAKE 1/2 TABLET BY MOUTH EVERY DAY 45 tablet 3   tiZANidine (ZANAFLEX) 4 MG tablet Take 1 tablet by mouth every 8 (eight)  hours as needed for muscle spasms.     Turmeric 500 MG CAPS Take 500 mg by mouth daily.      vitamin B-12 (CYANOCOBALAMIN) 1000 MCG tablet Take 1,000 mcg by mouth every other day.     Current Facility-Administered Medications  Medication Dose Route Frequency Provider Last Rate Last Admin   sodium chloride flush (NS) 0.9 % injection 3 mL  3 mL Intravenous Q12H Almyra Deforest, [place]        Allergies  Allergen Reactions   Acyclovir And Related Other (See Comments)    Accumulates and causes stroke-like side-effects due to cytochrome P450 enzyme deficiency   Benzodiazepines Other (See Comments)    Patient states he gets stroke symptoms with benzo's.    Compazine [Prochlorperazine Maleate] Other (See Comments)    Accumulates and causes stroke-like side-effects due to cytochrome P450 enzyme deficiency   Coreg [Carvedilol] Other (See Comments)    Accumulates and causes stroke-like side-effects due to cytochrome P450 enzyme deficiency Patient poor metabolizer of CYP2D6 - Coreg undergoes extensive hepatic (including 2D6)   Flomax [Tamsulosin Hcl] Other (See Comments)    Accumulates and causes stroke-like side-effects due to cytochrome P450 enzyme deficiency   Lisinopril Other (See Comments)    Hyperkalemia. 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   Mobic [Meloxicam] Other (See Comments)    Accumulates and causes stroke-like side-effects due to cytochrome P450 enzyme deficiency   Ondansetron Other (See Comments)    Accumulates and causes stroke-like side-effects due to cytochrome P450 enzyme deficiency    Oxycodone Other (See Comments)    Accumulates and causes stroke-like side-effects due to cytochrome P450 enzyme deficiency   Promethazine Other (See Comments)    Accumulates and causes stroke-like side-effects due to cytochrome P450 enzyme deficiency   Sertraline Hcl Other (See Comments)    Accumulates and causes stroke-like side-effects due to  cytochrome P450 enzyme deficiency   Tramadol Other (See Comments)    Accumulates and causes stroke-like side-effects due to cytochrome P450 enzyme deficiency   Atorvastatin Other (See Comments)    MYALGIAS   Colchicine Other (See Comments)    ataxia   Fentanyl Other (See Comments)    Accumulates and cause stroke like symptoms    Social History   Socioeconomic History   Marital status: Married    Spouse name: Karmen Bongo, RN   Number of children: 2   Years of education: Not on file   Highest education level: Some college, no degree  Occupational History   Occupation: Retired Animal nutritionist  Tobacco Use   Smoking status: Some Days    Packs/day: 1.00    Years: 34.00    Total pack years: 34.00    Types: Cigarettes  Last attempt to quit: 06/06/1990    Years since quitting: 31.4   Smokeless tobacco: Never  Vaping Use   Vaping Use: Never used  Substance and Sexual Activity   Alcohol use: Yes    Alcohol/week: 3.0 standard drinks of alcohol    Types: 1 Cans of beer, 2 Shots of liquor per week    Comment: occassional   Drug use: No   Sexual activity: Yes  Other Topics Concern   Not on file  Social History Narrative   Left Handed    One Story home    Social Determinants of Health   Financial Resource Strain: Low Risk  (08/30/2021)   Overall Financial Resource Strain (CARDIA)    Difficulty of Paying Living Expenses: Not hard at all  Food Insecurity: No Food Insecurity (08/30/2021)   Hunger Vital Sign    Worried About Running Out of Food in the Last Year: Never true    Ran Out of Food in the Last Year: Never true  Transportation Needs: No Transportation Needs (08/30/2021)   PRAPARE - Hydrologist (Medical): No    Lack of Transportation (Non-Medical): No  Physical Activity: Insufficiently Active (08/30/2021)   Exercise Vital Sign    Days of Exercise per Week: 3 days    Minutes of Exercise per Session: 30 min  Stress: No Stress Concern  Present (08/30/2021)   [place]    Feeling of Stress : Not at all  Social Connections: Moderately Integrated (08/30/2021)   Social Connection and Isolation Panel [NHANES]    Frequency of Communication with Friends and Family: Three times a week    Frequency of Social Gatherings with Friends and Family: Three times a week    Attends Religious Services: Never    Active Member of Clubs or Organizations: Yes    Attends Archivist Meetings: 1 to 4 times per year    Marital Status: Living with partner  Intimate Partner Violence: Not At Risk (08/30/2021)   Humiliation, Afraid, Rape, and Kick questionnaire    Fear of Current or Ex-Partner: No    Emotionally Abused: No    Physically Abused: No    Sexually Abused: No     Review of Systems: As noted in HPI All other systems reviewed and are otherwise negative except as noted above.    Blood pressure 126/72, pulse 62, height '5\' 8"'$  (1.727 m), weight 214 lb (97.1 kg).  GENERAL:  Well appearing WM in NAD HEENT:  PERRL, EOMI, sclera are clear. Oropharynx is clear. NECK:  No jugular venous distention, carotid upstroke brisk and symmetric, no bruits, no thyromegaly or adenopathy LUNGS:  Clear to auscultation bilaterally CHEST:  Unremarkable, old sternotomy scar HEART:  RRR,  PMI not displaced or sustained,S1 and S2 within normal limits, no S3, no S4: no clicks, no rubs, gr 2/6 systolic murmur RUSB.  ABD:  Soft, nontender. BS +, no masses or bruits. No hepatomegaly, no splenomegaly EXT:  2 + pulses throughout, Tr edema on left- left calf is larger than right., no cyanosis no clubbing SKIN:  Warm and dry.  No rashes NEURO:  Alert and oriented x 3. Cranial nerves II through XII intact. PSYCH:  Cognitively intact    Lab Results  Component Value Date   WBC 8.3 06/09/2021   HGB 15.7 06/09/2021   HCT 46.9 06/09/2021   PLT 167.0 06/09/2021   GLUCOSE 101 (H) 06/09/2021    CHOL 129  06/09/2021   TRIG 295.0 (H) 06/09/2021   HDL 39.00 (L) 06/09/2021   LDLDIRECT 73.0 06/09/2021   LDLCALC 55 06/10/2020   ALT 29 06/09/2021   AST 23 06/09/2021   NA 140 06/09/2021   K 4.1 06/09/2021   CL 105 06/09/2021   CREATININE 1.00 06/09/2021   BUN 18 06/09/2021   CO2 26 06/09/2021   TSH 2.59 06/09/2021   PSA 1.49 06/10/2020   INR 1.47 03/30/2015   HGBA1C 6.6 (H) 06/09/2021   MICROALBUR 8.8 (H) 06/10/2020     Echo 04/24/15: Study Conclusions   - Left ventricle: The cavity size was normal. Wall thickness was   increased in a pattern of mild LVH. Systolic function was normal.   The estimated ejection fraction was in the range of 55% to 60%.   Wall motion was normal; there were no regional wall motion   abnormalities. Doppler parameters are consistent with abnormal   left ventricular relaxation (grade 1 diastolic dysfunction). - Aortic valve: A bioprosthesis was present. There was trivial   regurgitation. - Left atrium: The atrium was mildly dilated. - Atrial septum: There was an atrial septal aneurysm. - Pericardium, extracardiac: A small pericardial effusion was   identified.   Impressions:   - Normal LV systolic function; grade 1 diastolic dysfunction; mild   LVH; s/p AVR with trace AI and mean gradient of 18 mmHg; mild   LAE; trace MR.  Myoview 10/22/16: Study Highlights     The left ventricular ejection fraction is mildly decreased (45-54%). Nuclear stress EF: 53%. No T wave inversion was noted during stress. There was no ST segment deviation noted during stress. This is a low risk study.   No reversible ischemia. LVEF 53% with normal wall motion. This is a low risk study.     Cardiac cath: 10/25/18:  LEFT HEART CATH AND CORS/GRAFTS ANGIOGRAPHY  Conclusion    Ost LAD to Prox LAD lesion is 80% stenosed. Mid LAD lesion is 100% stenosed -after second septal branch which coursed is a tandem LAD LIMA-dLAD graft was visualized by angiography and is  normal in caliber. The graft exhibits no disease. Ost Ramus-1 lesion is 90% stenosed. Ost Ramus-2 lesion is 60% stenosed (shortly after lateral branch). SVG-Ramus graft was visualized by angiography and is large. The graft exhibits minimal luminal irregularities. Ost Cx lesion is 80% stenosed. Prox Cx to Mid Cx lesion is 80% stenosed. Mid Cx lesion is 60% stenosed. SVG-dCx graft was visualized by angiography and is large. The graft exhibits minimal luminal irregularities. Retrograde filling of OM 2 and AV groove circumflex Prox RCA to Mid RCA lesion is 100% stenosed. This is progression from 99% stenosis pre-CABG. SVG-dRCA graft was visualized by angiography and is large. The graft exhibits no disease. Unable to cross aortic valve prosthesis.   SUMMARY Severe native CAD essentially unchanged from post CABG with exception of now 100% occluded RCA. 4 out of 4 patent grafts: LIMA-LAD, SVG-RI (D1), SVG-dCx, SVG-D RCA Systemic Hypertension Angiographically small aortic root/stent prosthetic valve.  Unable to cross.     RECOMMENDATIONS Discharge home after bedrest and hydration Consider 2D echocardiogram to assess for restenosis of aortic valve Pending echocardiographic evaluation, would consider non-anginal etiology -> with hypertension, could be related to diastolic dysfunction.     Glenetta Hew, MD   Echo 11/20/18:IMPRESSIONS      1. A 45m an Edwards porcine bioprosthesis valve is present in the aortic position. Procedure Date: 03/30/15 Echo findings shows no evidence of regurgitation, perivalvular leak,  rocking or dehiscence of the aortic prosthesis. Bioprosthetic leaflets not  well seen. Mean systolic gradient is 19 mmHg, no significant difference from gradient reported 1 month post operative in 04/2015.  2. The left ventricle has normal systolic function with an ejection fraction of 60-65%. The cavity size was normal. There is mildly increased left ventricular wall thickness. Left  ventricular diastolic Doppler parameters are consistent with impaired  relaxation due to nondiagnostic images. Elevated left ventricular end-diastolic pressure The E/e' is 20.  3. The right ventricle has normal systolic function. The cavity was normal. There is no increase in right ventricular wall thickness. Right ventricular systolic pressure is mildly elevated with an estimated pressure of 35.9 mmHg.  4. Left atrial size was mild-moderately dilated.  5. There is mild mitral annular calcification present.  6. There is mild dilatation of the ascending aorta measuring 40 mm.    ASSESSMENT AND PLAN:  1.  CAD s/p CABG in October 2016 following NSTEMI.   Myoview negative in May 2018. Cardiac cath in May 2020 showed all grafts were patent. Rare angina relieved with Ntg. Continue statin. Not on beta blocker due to bradycardia. Will monitor his symptoms. Let me know if angina increases in frequency or severity.  2. S/p bioprosthetic AVR for aortic stenosis. On routine SBE prophylaxis.  Echo June 2020 unchanged from initial post op Echo.  3.  Hyperlipidemia. Satisfactory on Crestor 5 mg daily. Plan repeat lab work with Dr Jenny Reichmann   4. HTN. With white coat syndrome. BP acceptable today. Continue same meds. Not a candidate for beta blocker due to bradycardia, would avoid ACEi, ARB, aldactone with elevated potassium.  5. 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   6. ? LE arterial insufficiency. Will check doppler study in our lab today  Follow up in 6 months      Deicy Rusk [name] MD,FACC 11/18/2021 12:03 PM

## 2021-11-18 ENCOUNTER — Ambulatory Visit: Payer: Medicare Other | Admitting: Cardiology

## 2021-11-18 ENCOUNTER — Encounter: Payer: Self-pay | Admitting: Cardiology

## 2021-11-18 ENCOUNTER — Ambulatory Visit (HOSPITAL_COMMUNITY)
Admission: RE | Admit: 2021-11-18 | Discharge: 2021-11-18 | Disposition: A | Discharge status: Home / Self Care | Payer: Medicare Other | Source: Ambulatory Visit | Attending: Internal Medicine | Admitting: Internal Medicine

## 2021-11-18 VITALS — BP 126/72 | HR 62 | Ht 68.0 in | Wt 214.0 lb

## 2021-11-18 DIAGNOSIS — E785 Hyperlipidemia, unspecified: Secondary | ICD-10-CM

## 2021-11-18 DIAGNOSIS — I739 Peripheral vascular disease, unspecified: Secondary | ICD-10-CM | POA: Diagnosis not present

## 2021-11-18 DIAGNOSIS — I10 Essential (primary) hypertension: Secondary | ICD-10-CM

## 2021-11-18 DIAGNOSIS — I25810 Atherosclerosis of coronary artery bypass graft(s) without angina pectoris: Secondary | ICD-10-CM

## 2021-11-18 DIAGNOSIS — M79605 Pain in left leg: Secondary | ICD-10-CM

## 2021-11-18 DIAGNOSIS — Z952 Presence of prosthetic heart valve: Secondary | ICD-10-CM | POA: Diagnosis not present

## 2021-11-18 NOTE — Patient Instructions (Signed)
Medication Instructions:  °No changes ° ° °*If you need a refill on your cardiac medications before your next appointment, please call your pharmacy* ° ° °Lab Work: ° °Not needed ° ° °Testing/Procedures: °Not needed ° ° °Follow-Up: °At [facility], you and your health needs are our priority.  As part of our continuing mission to provide you with exceptional heart care, we have created designated Provider Care Teams.  These Care Teams include your primary Cardiologist (physician) and Advanced Practice Providers (APPs -  Physician Assistants and Nurse Practitioners) who all work together to provide you with the care you need, when you need it. ° °  ° °Your next appointment:   °6 month(s) ° °The format for your next appointment:   °In Person ° °Provider:   °Peter Jordan, MD  ° ° ° °

## 2021-11-24 ENCOUNTER — Ambulatory Visit (INDEPENDENT_AMBULATORY_CARE_PROVIDER_SITE_OTHER): Payer: Medicare Other

## 2021-11-24 VITALS — BP 163/71 | HR 55 | Temp 97.9°F | Resp 18 | Ht 68.0 in | Wt 214.4 lb

## 2021-11-24 DIAGNOSIS — G43719 Chronic migraine without aura, intractable, without status migrainosus: Secondary | ICD-10-CM

## 2021-11-24 DIAGNOSIS — G4489 Other headache syndrome: Secondary | ICD-10-CM | POA: Diagnosis not present

## 2021-11-24 MED ORDER — SODIUM CHLORIDE 0.9 % IV SOLN
100.0000 mg | Freq: Once | INTRAVENOUS | Status: AC
  Administered 2021-11-24: 100 mg via INTRAVENOUS
  Filled 2021-11-24: qty 1

## 2021-11-24 NOTE — Patient Instructions (Signed)
Eptinezumab Injection What is this medication? EPTINEZUMAB (EP ti NEZ ue mab) prevents migraines. It works by blocking a substance in the body that causes migraines. It is a monoclonal antibody. This medicine may be used for other purposes; ask your health care provider or pharmacist if you have questions. COMMON BRAND NAME(S): Vyepti What should I tell my care team before I take this medication? They need to know if you have any of these conditions: An unusual or allergic reaction to eptinezumab, other medications, foods, dyes, or preservatives Pregnant or trying to get pregnant Breast-feeding How should I use this medication? This medication is injected into a vein. It is given by your care team in a hospital or clinic setting. Talk to your care team about the use of this medication in children. Special care may be needed. Overdosage: If you think you have taken too much of this medicine contact a poison control center or emergency room at once. NOTE: This medicine is only for you. Do not share this medicine with others. What if I miss a dose? Keep appointments for follow-up doses. It is important not to miss your dose. Call your care team if you are unable to keep an appointment. What may interact with this medication? Interactions are not expected. This list may not describe all possible interactions. Give your health care provider a list of all the medicines, herbs, non-prescription drugs, or dietary supplements you use. Also tell them if you smoke, drink alcohol, or use illegal drugs. Some items may interact with your medicine. What should I watch for while using this medication? Your condition will be monitored carefully while you are receiving this medication. What side effects may I notice from receiving this medication? Side effects that you should report to your care team as soon as possible: Allergic reactions or angioedema--skin rash, itching or hives, swelling of the face, eyes,  lips, tongue, arms, or legs, trouble swallowing or breathing Side effects that usually do not require medical attention (report to your care team if they continue or are bothersome): Runny or stuffy nose This list may not describe all possible side effects. Call your doctor for medical advice about side effects. You may report side effects to FDA at 1-800-FDA-1088. Where should I keep my medication? This medication is given in a hospital or clinic. It will not be stored at home. NOTE: This sheet is a summary. It may not cover all possible information. If you have questions about this medicine, talk to your doctor, pharmacist, or health care provider.  2023 Elsevier/Gold Standard (2021-07-19 00:00:00)  

## 2021-11-24 NOTE — Progress Notes (Signed)
Diagnosis: Intractable Chronic Migraine  Provider:  Marshell Garfinkel, MD  Procedure: Infusion  IV Type: Peripheral, IV Location: L Upper Arm  Vyepti (Eptinezumab-jjmr), Dose: 100 mg  Infusion Start Time: 1005  Infusion Stop Time: 6429  Post Infusion IV Care: Observation period completed and Peripheral IV Discontinued  Discharge: Condition: Good, Destination: Home . AVS provided to patient.   Performed by:  Cleophus Molt, RN

## 2021-11-30 ENCOUNTER — Other Ambulatory Visit: Payer: Self-pay | Admitting: Physician Assistant

## 2021-12-01 ENCOUNTER — Other Ambulatory Visit: Payer: Self-pay | Admitting: Cardiology

## 2021-12-02 ENCOUNTER — Encounter (HOSPITAL_COMMUNITY): Payer: Self-pay

## 2021-12-02 ENCOUNTER — Other Ambulatory Visit: Payer: Self-pay

## 2021-12-02 ENCOUNTER — Telehealth: Payer: Self-pay | Admitting: Cardiology

## 2021-12-02 ENCOUNTER — Emergency Department (HOSPITAL_COMMUNITY): Payer: Medicare Other

## 2021-12-02 ENCOUNTER — Inpatient Hospital Stay (HOSPITAL_COMMUNITY)
Admission: EM | Admit: 2021-12-02 | Discharge: 2021-12-04 | DRG: 287 | Disposition: A | Discharge status: Home / Self Care | Payer: Medicare Other | Attending: Cardiology | Admitting: Cardiology

## 2021-12-02 DIAGNOSIS — R6889 Other general symptoms and signs: Secondary | ICD-10-CM | POA: Diagnosis not present

## 2021-12-02 DIAGNOSIS — Z885 Allergy status to narcotic agent status: Secondary | ICD-10-CM

## 2021-12-02 DIAGNOSIS — R0789 Other chest pain: Secondary | ICD-10-CM | POA: Diagnosis not present

## 2021-12-02 DIAGNOSIS — K219 Gastro-esophageal reflux disease without esophagitis: Secondary | ICD-10-CM | POA: Diagnosis not present

## 2021-12-02 DIAGNOSIS — Z833 Family history of diabetes mellitus: Secondary | ICD-10-CM | POA: Diagnosis not present

## 2021-12-02 DIAGNOSIS — I2582 Chronic total occlusion of coronary artery: Secondary | ICD-10-CM | POA: Diagnosis not present

## 2021-12-02 DIAGNOSIS — Z888 Allergy status to other drugs, medicaments and biological substances status: Secondary | ICD-10-CM | POA: Diagnosis not present

## 2021-12-02 DIAGNOSIS — F419 Anxiety disorder, unspecified: Secondary | ICD-10-CM | POA: Diagnosis present

## 2021-12-02 DIAGNOSIS — I10 Essential (primary) hypertension: Secondary | ICD-10-CM | POA: Diagnosis not present

## 2021-12-02 DIAGNOSIS — E785 Hyperlipidemia, unspecified: Secondary | ICD-10-CM | POA: Diagnosis not present

## 2021-12-02 DIAGNOSIS — F4310 Post-traumatic stress disorder, unspecified: Secondary | ICD-10-CM | POA: Diagnosis present

## 2021-12-02 DIAGNOSIS — Z981 Arthrodesis status: Secondary | ICD-10-CM | POA: Diagnosis not present

## 2021-12-02 DIAGNOSIS — I25110 Atherosclerotic heart disease of native coronary artery with unstable angina pectoris: Secondary | ICD-10-CM | POA: Diagnosis not present

## 2021-12-02 DIAGNOSIS — Z79890 Hormone replacement therapy (postmenopausal): Secondary | ICD-10-CM

## 2021-12-02 DIAGNOSIS — I25119 Atherosclerotic heart disease of native coronary artery with unspecified angina pectoris: Secondary | ICD-10-CM | POA: Diagnosis present

## 2021-12-02 DIAGNOSIS — E039 Hypothyroidism, unspecified: Secondary | ICD-10-CM | POA: Diagnosis present

## 2021-12-02 DIAGNOSIS — Z8249 Family history of ischemic heart disease and other diseases of the circulatory system: Secondary | ICD-10-CM | POA: Diagnosis not present

## 2021-12-02 DIAGNOSIS — Z952 Presence of prosthetic heart valve: Secondary | ICD-10-CM | POA: Diagnosis not present

## 2021-12-02 DIAGNOSIS — Z85828 Personal history of other malignant neoplasm of skin: Secondary | ICD-10-CM | POA: Diagnosis not present

## 2021-12-02 DIAGNOSIS — Z7982 Long term (current) use of aspirin: Secondary | ICD-10-CM | POA: Diagnosis not present

## 2021-12-02 DIAGNOSIS — R079 Chest pain, unspecified: Secondary | ICD-10-CM | POA: Diagnosis not present

## 2021-12-02 DIAGNOSIS — Z743 Need for continuous supervision: Secondary | ICD-10-CM | POA: Diagnosis not present

## 2021-12-02 DIAGNOSIS — Z951 Presence of aortocoronary bypass graft: Secondary | ICD-10-CM

## 2021-12-02 DIAGNOSIS — G4733 Obstructive sleep apnea (adult) (pediatric): Secondary | ICD-10-CM | POA: Diagnosis not present

## 2021-12-02 DIAGNOSIS — Z96643 Presence of artificial hip joint, bilateral: Secondary | ICD-10-CM | POA: Diagnosis not present

## 2021-12-02 DIAGNOSIS — I200 Unstable angina: Secondary | ICD-10-CM

## 2021-12-02 DIAGNOSIS — R531 Weakness: Secondary | ICD-10-CM | POA: Diagnosis not present

## 2021-12-02 LAB — BASIC METABOLIC PANEL
Anion gap: 11 (ref 5–15)
BUN: 13 mg/dL (ref 8–23)
CO2: 25 mmol/L (ref 22–32)
Calcium: 9.1 mg/dL (ref 8.9–10.3)
Chloride: 105 mmol/L (ref 98–111)
Creatinine, Ser: 0.94 mg/dL (ref 0.61–1.24)
GFR, Estimated: >60 mL/min (ref >60)
Glucose, Bld: 108 mg/dL — ABNORMAL HIGH (ref 70–99)
Potassium: 3.7 mmol/L (ref 3.5–5.1)
Sodium: 141 mmol/L (ref 135–145)

## 2021-12-02 LAB — CBG MONITORING, ED: Glucose-Capillary: 116 mg/dL — ABNORMAL HIGH (ref 70–99)

## 2021-12-02 LAB — PROTIME-INR
INR: 1.1 (ref 0.8–1.2)
Prothrombin Time: 14.4 seconds (ref 11.4–15.2)

## 2021-12-02 LAB — TROPONIN I (HIGH SENSITIVITY)
Troponin I (High Sensitivity): 8 ng/L (ref <18)
Troponin I (High Sensitivity): 7 ng/L (ref <18)

## 2021-12-02 LAB — CBC
HCT: 45.6 % (ref 39.0–52.0)
Hemoglobin: 15.6 g/dL (ref 13.0–17.0)
MCH: 31.5 pg (ref 26.0–34.0)
MCHC: 34.2 g/dL (ref 30.0–36.0)
MCV: 91.9 fL (ref 80.0–100.0)
Platelets: 152 10*3/uL (ref 150–400)
RBC: 4.96 MIL/uL (ref 4.22–5.81)
RDW: 13.6 % (ref 11.5–15.5)
WBC: 7.1 10*3/uL (ref 4.0–10.5)
nRBC: 0 % (ref 0.0–0.2)

## 2021-12-02 MED ORDER — HEPARIN BOLUS VIA INFUSION
4000.0000 [IU] | Freq: Once | INTRAVENOUS | Status: AC
  Administered 2021-12-02: 4000 [IU] via INTRAVENOUS
  Filled 2021-12-02: qty 4000

## 2021-12-02 MED ORDER — NITROGLYCERIN IN D5W 200-5 MCG/ML-% IV SOLN
0.0000 ug/min | INTRAVENOUS | Status: DC
  Administered 2021-12-02: 5 ug/min via INTRAVENOUS
  Filled 2021-12-02: qty 250

## 2021-12-02 MED ORDER — ACETAMINOPHEN 325 MG PO TABS
650.0000 mg | ORAL_TABLET | ORAL | Status: DC | PRN
  Administered 2021-12-03 (×2): 650 mg via ORAL
  Filled 2021-12-02 (×2): qty 2

## 2021-12-02 MED ORDER — HEPARIN (PORCINE) 25000 UT/250ML-% IV SOLN
1200.0000 [IU]/h | INTRAVENOUS | Status: DC
  Administered 2021-12-02: 1200 [IU]/h via INTRAVENOUS
  Filled 2021-12-02: qty 250

## 2021-12-02 MED ORDER — ASPIRIN 81 MG PO TBEC
81.0000 mg | DELAYED_RELEASE_TABLET | Freq: Every day | ORAL | Status: DC
  Administered 2021-12-03 – 2021-12-04 (×2): 81 mg via ORAL
  Filled 2021-12-02 (×2): qty 1

## 2021-12-02 MED ORDER — ONDANSETRON HCL 4 MG/2ML IJ SOLN: 4.0000 mg | Freq: Four times a day (QID) | INTRAMUSCULAR | Status: DC | PRN

## 2021-12-02 MED ORDER — AMLODIPINE BESYLATE 10 MG PO TABS
10.0000 mg | ORAL_TABLET | Freq: Every day | ORAL | Status: DC
  Administered 2021-12-02 – 2021-12-04 (×3): 10 mg via ORAL
  Filled 2021-12-02: qty 1
  Filled 2021-12-02 (×2): qty 2

## 2021-12-02 MED ORDER — NITROGLYCERIN 0.4 MG SL SUBL: 0.4000 mg | SUBLINGUAL_TABLET | SUBLINGUAL | Status: DC | PRN

## 2021-12-02 NOTE — Progress Notes (Signed)
ANTICOAGULATION CONSULT NOTE - Initial Consult  Pharmacy Consult for Heparin Indication: chest pain/ACS  Allergies  Allergen Reactions   Acyclovir And Related Other (See Comments)    Accumulates and causes stroke-like side-effects due to cytochrome P450 enzyme deficiency   Benzodiazepines Other (See Comments)    Patient states he gets stroke symptoms with benzo's.    Compazine [Prochlorperazine Maleate] Other (See Comments)    Accumulates and causes stroke-like side-effects due to cytochrome P450 enzyme deficiency   Coreg [Carvedilol] Other (See Comments)    Accumulates and causes stroke-like side-effects due to cytochrome P450 enzyme deficiency Patient poor metabolizer of CYP2D6 - Coreg undergoes extensive hepatic (including 2D6)   Flomax [Tamsulosin Hcl] Other (See Comments)    Accumulates and causes stroke-like side-effects due to cytochrome P450 enzyme deficiency   Lisinopril Other (See Comments)    Hyperkalemia. 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   Mobic [Meloxicam] Other (See Comments)    Accumulates and causes stroke-like side-effects due to cytochrome P450 enzyme deficiency   Ondansetron Other (See Comments)    Accumulates and causes stroke-like side-effects due to cytochrome P450 enzyme deficiency    Oxycodone Other (See Comments)    Accumulates and causes stroke-like side-effects due to cytochrome P450 enzyme deficiency   Promethazine Other (See Comments)    Accumulates and causes stroke-like side-effects due to cytochrome P450 enzyme deficiency   Sertraline Hcl Other (See Comments)    Accumulates and causes stroke-like side-effects due to cytochrome P450 enzyme deficiency   Tramadol Other (See Comments)    Accumulates and causes stroke-like side-effects due to cytochrome P450 enzyme deficiency   Atorvastatin Other (See Comments)    MYALGIAS   Colchicine Other (See Comments)    ataxia   Fentanyl Other (See  Comments)    Accumulates and cause stroke like symptoms    Patient Measurements: Height: '5\' 8"'$  (172.7 cm) Weight: 97.1 kg (214 lb) IBW/kg (Calculated) : 68.4 Heparin Dosing Weight: 89 kg  Vital Signs: Temp: 98.2 F (36.8 C) (06/29 1552) Temp Source: Oral (06/29 1552) BP: 157/78 (06/29 1745) Pulse Rate: 46 (06/29 1745)  Labs: Recent Labs    12/02/21 1544  HGB 15.6  HCT 45.6  PLT 152  CREATININE 0.94  TROPONINIHS 8    Estimated Creatinine Clearance: 73.2 mL/min (by C-G formula based on SCr of 0.94 mg/dL).   Medical History: Past Medical History:  Diagnosis Date   Allergy    Anxiety    PTSD   Aortic stenosis, moderate 03/27/2015   post AVR echo Echo 11/16: Mild LVH, EF 55-60%, normal wall motion, grade 1 diastolic dysfunction, bioprosthetic AVR okay (mean gradient 18 mmHg) with trivial AI, mild LAE, atrial septal aneurysm, small effusion    Arthritis    "qwhere" (10/29/2015)   Cancer ([place])    skin cancer   Carotid artery stenosis    Carotid US 11/17: 1-39% bilateral ICA stenosis. F/u prn   Cervical stenosis of spinal canal    fusion 2006   CHF (congestive heart failure) ([place])    "secondry to [facility]"   Closed left pilon fracture    COLONIC POLYPS, HX OF    Complication of anesthesia    "takes a lot to put him under and has woken up during surgery" Difficult to wake up . PTSD; "does not metabolize RX well"that he gets "violent", per pt.    Coronary artery disease    DIVERTICULOSIS, COLON    Dysrhythmia    2 bouts of afib  post op and at home but corrected with medication.   Familial tremor 03/19/2014   Family history of adverse reaction to anesthesia    Father - hold to get asleep   GERD (gastroesophageal reflux disease)    uses Zantac   GOUT    Headache    Heart murmur    had aortic value replacement   High cholesterol    History of blood transfusion    History of kidney stones    surgery   HYPERTENSION    off ACEI 2010 because of hyperkalemia in setting  of ARI   Hypothyroidism    newly diaganosed 09/2015   Myocardial infarction [facility])    October 20th 2016   OSA on CPAP    Pneumonia    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 ([place])    confirmed heterozygous 10/24/14 labs   PTSD (post-traumatic stress disorder)    PTSD (post-traumatic stress disorder)    Sleep apnea    On a CPAP    Medications:  (Not in a hospital admission)  Scheduled:   sodium chloride flush  3 mL Intravenous Q12H   Infusions:   nitroGLYCERIN     PRN:   Assessment: 89 yom with a history of poor metabolizer of CYP2D6, CAD s/p CABG, HTN, s.p bioprosthetic AVR for aortic stenosis, HLD. Patient is presenting with chest pain. Heparin per pharmacy consult placed for chest pain/ACS.  Patient is not on anticoagulation prior to arrival.  Hgb 15.6; plt 152  Goal of Therapy:  Heparin level 0.3-0.7 units/ml Monitor platelets by anticoagulation protocol: Yes   Plan:  Give 4000 units bolus x 1 Start heparin infusion at 1200 units/hr Check anti-Xa level at 0200 and daily while on heparin Continue to monitor H&H and platelets  Lorelei Pont, PharmD, BCPS 12/02/2021 6:39 PM ED Clinical Pharmacist -  [phone]

## 2021-12-02 NOTE — Telephone Encounter (Signed)
Pt c/o of Chest Pain: STAT if CP now or developed within 24 hours  1. Are you having CP right now? yes  2. Are you experiencing any other symptoms (ex. SOB, nausea, vomiting, sweating)? no  3. How long have you been experiencing CP? N/a  4. Is your CP continuous or coming and going? N/a  5. Have you taken Nitroglycerin? Yes   Pt's wife wanted to inform Dr. [name] that pt is going to the ER right now for chest pain unrelieved, with nitrogyycerin x3.  ?

## 2021-12-02 NOTE — ED Notes (Signed)
Meal tray provided at this time.

## 2021-12-02 NOTE — ED Provider Triage Note (Signed)
Emergency Medicine Provider Triage Evaluation Note  Troy Banks , a 81 y.o. male  was evaluated in triage.  Pt complains of chest pain that began overnight described as left sided pressure, radiation to the back. Endorses weakness and shortness of breath with onset of pain. Endorses relief of symptoms with NTG. Patient took three doses of NTG at home, EMS administered 1 dose NTG and '324mg'$  ASA. Patient with history of CABG approximately 4 years ago  Review of Systems  Positive: Chest pain, shortness of breath Negative: Nausea, vomiting  Physical Exam  There were no vitals taken for this visit. Gen:   Awake, no distress   Resp:  Normal effort  MSK:   Moves extremities without difficulty  Other:    Medical Decision Making  Medically screening exam initiated at 3:44 PM.  Appropriate orders placed.  [name] was informed that the remainder of the evaluation will be completed by another provider, this initial triage assessment does not replace that evaluation, and the importance of remaining in the ED until their evaluation is complete.     Dorothyann Peng, [name] 12/02/21 1549

## 2021-12-02 NOTE — ED Provider Notes (Signed)
[facility]ENCY DEPARTMENT Provider Note   CSN: [id number] Arrival date & time: 12/02/21  1541     History  Chief Complaint  Patient presents with   Chest Pain    Troy Banks is a 81 y.o. male presenting to ED with complaint of chest pain.  He reports onset earlier today, has taken 3 nitroglycerin as well as full dose aspirin with no relief of his pain.  He does have a significant history of multivessel coronary disease, reportedly is not a stent candidate, and has suffered from anginal episodes since his last cardiac catheterization.  However his episodes typically go away with nitroglycerin, 1 to 2 tablets, this 1 is persistent.  His pain has improved, currently 6 out of 10.  He was complaining of some lightheadedness earlier today.  He continues to feel mildly lightheaded.  Denies diaphoresis.  Last [facility] May 202: SUMMARY Severe native CAD essentially unchanged from post CABG with exception of now 100% occluded RCA. 4 out of 4 patent grafts: LIMA-LAD, SVG-RI (D1), SVG-dCx, SVG-D RCA Systemic Hypertension Angiographically small aortic root/stent prosthetic valve.  Unable to cross.    HPI     Home Medications Prior to Admission medications   Medication Sig Start Date End Date Taking? Authorizing Provider  acetaminophen (TYLENOL) 500 MG tablet Take 1,000 mg by mouth every 6 (six) hours as needed for moderate pain. 12/18/17  Yes [provider]  allopurinol (ZYLOPRIM) 300 MG tablet TAKE 1 TABLET BY MOUTH EVERY DAY Patient taking differently: Take 300 mg by mouth daily. 06/14/21  Yes [name], MD  amLODipine (NORVASC) 10 MG tablet TAKE 1 TABLET BY MOUTH EVERY DAY Patient taking differently: Take 10 mg by mouth every evening. 07/07/21  Yes [name], MD  Ascorbic Acid (VITAMIN C) 1000 MG tablet Take 1,000 mg by mouth daily.   Yes [provider]  aspirin EC 81 MG tablet Take 81 mg by mouth every other day. Swallow whole.   Yes  [provider]  calcium carbonate (TUMS EX) 750 MG chewable tablet Chew 1 tablet by mouth daily as needed for heartburn.    Yes [provider]  Carboxymethylcellulose Sodium (LUBRICANT EYE DROPS OP) Place 1 drop into both eyes 2 (two) times daily as needed (dry/irritated eyes.).    Yes [provider]  Cholecalciferol (VITAMIN D-3) 25 MCG (1000 UT) CAPS Take 1,000 Units by mouth every other day.   Yes [provider]  famotidine (PEPCID) 40 MG tablet TAKE 1 TABLET BY MOUTH EVERY DAY Patient taking differently: Take 40 mg by mouth daily. 08/16/21  Yes [name], MD  furosemide (LASIX) 40 MG tablet TAKE 1 TABLET BY MOUTH EVERY OTHER DAY Patient taking differently: Take 40 mg by mouth every other day. 11/15/21  Yes [name], [name], MD  gabapentin (NEURONTIN) 300 MG capsule Take 1 capsule (300 mg total) by mouth 2 (two) times daily. 11/11/21  Yes [name], [name], DO  levothyroxine (SYNTHROID) 25 MCG tablet TAKE 1 TABLET BY MOUTH EVERY DAY WITH BREAKFAST Patient taking differently: Take 25 mcg by mouth daily before breakfast. 06/29/21  Yes [name], MD  nitroGLYCERIN (NITROSTAT) 0.4 MG SL tablet PLACE 1 TABLET UNDER THE TONGUE EVERY 5 MINUTES AS NEEDED FOR CHEST PAIN. Patient taking differently: Place 0.4 mg under the tongue every 5 (five) minutes as needed for chest pain. 11/30/21  Yes [name], [name], MD  primidone (MYSOLINE) 50 MG tablet Take 1 tablet (50  mg total) by mouth 2 (two) times daily. 05/10/21  Yes [name], [name], DO  rosuvastatin (CRESTOR) 10 MG tablet TAKE 1/2 TABLET BY MOUTH EVERY DAY Patient taking differently: Take 5 mg by mouth every evening. 12/01/21  Yes Minus Breeding, MD  tiZANidine (ZANAFLEX) 4 MG tablet Take 2 mg by mouth every 8 (eight) hours as needed for muscle spasms. 12/18/17  Yes [provider]  Turmeric 500 MG CAPS Take 500 mg by mouth daily.    Yes [provider]  vitamin B-12 (CYANOCOBALAMIN) 1000 MCG tablet Take  1,000 mcg by mouth every other day.   Yes [provider]  ranitidine (ZANTAC) 150 MG tablet TAKE 1 TABLET BY MOUTH 2 TIMES DAILY Patient taking differently: No sig reported 09/05/18 12/13/18  [name], MD      Allergies    Acyclovir and related, Benzodiazepines, Compazine [prochlorperazine maleate], Coreg [carvedilol], Flomax [tamsulosin hcl], Lisinopril, Mobic [meloxicam], Ondansetron, Oxycodone, Promethazine, Sertraline hcl, Tramadol, Atorvastatin, Colchicine, and Fentanyl    Review of Systems   Review of Systems  Physical Exam Updated Vital Signs BP (!) 162/82   Pulse (!) 51   Temp 97.7 F (36.5 C) (Oral)   Resp 14   Ht '5\' 8"'$  (1.727 m)   Wt 97.1 kg   SpO2 98%   BMI 32.54 kg/m  Physical Exam Constitutional:      General: He is not in acute distress. HENT:     Head: Normocephalic and atraumatic.  Eyes:     Conjunctiva/sclera: Conjunctivae normal.     Pupils: Pupils are equal, round, and reactive to light.  Cardiovascular:     Rate and Rhythm: Normal rate and regular rhythm.  Pulmonary:     Effort: Pulmonary effort is normal. No respiratory distress.  Abdominal:     General: There is no distension.     Tenderness: There is no abdominal tenderness.  Skin:    General: Skin is warm and dry.  Neurological:     General: No focal deficit present.     Mental Status: He is alert. Mental status is at baseline.  Psychiatric:        Mood and Affect: Mood normal.        Behavior: Behavior normal.     ED Results / Procedures / Treatments   Labs (all labs ordered are listed, but only abnormal results are displayed) Labs Reviewed  BASIC METABOLIC PANEL - Abnormal; Notable for the following components:      Result Value   Glucose, Bld 108 (*)    All other components within normal limits  CBG MONITORING, ED - Abnormal; Notable for the following components:   Glucose-Capillary 116 (*)    All other components within normal limits  CBC  PROTIME-INR  HEPARIN LEVEL  (UNFRACTIONATED)  LIPOPROTEIN A (LPA)  BASIC METABOLIC PANEL  LIPID PANEL  CBC  TROPONIN I (HIGH SENSITIVITY)  TROPONIN I (HIGH SENSITIVITY)    EKG EKG Interpretation  Date/Time:  Thursday December 02 2021 15:49:05 EDT Ventricular Rate:  52 PR Interval:  224 QRS Duration: 78 QT Interval:  448 QTC Calculation: 416 R Axis:   56 Text Interpretation: Sinus bradycardia with 1st degree A-V block Nonspecific ST and T wave abnormality Abnormal ECG When compared with ECG of 25-Oct-2018 06:20, PREVIOUS ECG IS PRESENT Confirmed by Octaviano Glow [phone]) on 12/02/2021 5:40:07 PM  Radiology DG Chest 2 View  Result Date: 12/02/2021 CLINICAL DATA:  Chest pain. EXAM: CHEST - 2 VIEW COMPARISON:  Chest radiograph  dated September 29, 2020 FINDINGS: The heart size and mediastinal contours are within normal limits. Evidence of prior coronary artery bypass grafting and prosthetic aortic valve. Both lungs are clear. Thoracic spondylosis. No acute osseous abnormality. Sternotomy wires are intact. IMPRESSION: No active cardiopulmonary disease. Electronically Signed   By: Keane Police D.O.   On: 12/02/2021 16:12    Procedures .Critical Care  Performed by: Wyvonnia Dusky, MD Authorized by: Wyvonnia Dusky, MD   Critical care provider statement:    Critical care time (minutes):  45   Critical care time was exclusive of:  Separately billable procedures and treating other patients   Critical care was necessary to treat or prevent imminent or life-threatening deterioration of the following conditions:  Circulatory failure   Critical care was time spent personally by me on the following activities:  Ordering and performing treatments and interventions, ordering and review of laboratory studies, ordering and review of radiographic studies, pulse oximetry, review of old charts, examination of patient and evaluation of patient's response to treatment   Care discussed with: admitting provider   Comments:     Unstable  angina requiring IV nitroglycerin for ongoing chest pain and IV heparin for anticoagulation     Medications Ordered in ED Medications  nitroGLYCERIN 50 mg in dextrose 5 % 250 mL (0.2 mg/mL) infusion (5 mcg/min Intravenous New Bag/Given 12/02/21 1951)  heparin ADULT infusion 100 units/mL (25000 units/260m) (1,200 Units/hr Intravenous New Bag/Given 12/02/21 1949)  aspirin EC tablet 81 mg (has no administration in time range)  nitroGLYCERIN (NITROSTAT) SL tablet 0.4 mg (has no administration in time range)  acetaminophen (TYLENOL) tablet 650 mg (has no administration in time range)  ondansetron (ZOFRAN) injection 4 mg (has no administration in time range)  amLODipine (NORVASC) tablet 10 mg (has no administration in time range)  heparin bolus via infusion 4,000 Units (4,000 Units Intravenous Bolus from Bag 12/02/21 1950)    ED Course/ Medical Decision Making/ A&P Clinical Course as of 12/02/21 2107  Thu Dec 02, 2021  1847 I spoke to the cardiologist on-call who come evaluate the patient and agrees for medical admission for unstable angina.  Agrees with heparin and nitroglycerin infusion at this time. [MT]    Clinical Course User Index [MT] Jazel Nimmons, MCarola Rhine MD                           Medical Decision Making Amount and/or Complexity of Data Reviewed Labs: ordered.  Risk Prescription drug management. Decision regarding hospitalization.   This patient presents to the ED with concern for chest pain. This involves an extensive number of treatment options, and is a complaint that carries with it a high risk of complications and morbidity.  The differential diagnosis includes unstable angina versus pneumonia versus pneumothorax versus other  Co-morbidities that complicate the patient evaluation: History of severe coronary disease and ongoing angina, CABG, risk factors for recurring cardiac disease and coronary occlusion.  Additional history obtained from patient's wife at the  bedside  External records from outside source obtained and reviewed including left heart cath report as noted above  I ordered and personally interpreted labs.  The pertinent results include:    BMP, CBC, initial troponin normal.  Repeat troponin showed no significant elevation  I ordered imaging studies including x-ray of the chest I independently visualized and interpreted imaging which showed no acute findings I agree with the radiologist interpretation  The patient was maintained on a  cardiac monitor.  I personally viewed and interpreted the cardiac monitored which showed an underlying rhythm of: Sinus bradycardia (patient's wife reports his resting heart rate is between 50 and 60 bpm)  Per my interpretation the patient's ECG shows normal sinus rhythm with minor T wave inversions in the lateral leads, new from prior tracing.  No STEMI.  I ordered medication including IV heparin for unstable angina.  IV nitroglycerin for ongoing anginal chest pain.  Patient has already received 325 of aspirin, as well as several nitroglycerin tablets by EMS.  I have reviewed the patients home medicines and have made adjustments as needed  Test Considered: Doubt acute PE with this clinical presentation.  I requested consultation with the cardiology,  and discussed lab and imaging findings as well as pertinent plan - they recommend: Medical admission  After the interventions noted above, I reevaluated the patient and found that they have: improved   Dispostion:  After consideration of the diagnostic results and the patients response to treatment, I feel that the patent would benefit from admission.         Final Clinical Impression(s) / ED Diagnoses Final diagnoses:  Unstable angina [facility])    Rx / DC Orders ED Discharge Orders     None         Wyvonnia Dusky, MD 12/02/21 2107

## 2021-12-02 NOTE — H&P (Signed)
Cardiology Admission History and Physical:   Patient ID: Troy Banks MRN: [id number]; DOB: 09-19-1940   Admission date: 12/02/2021  PCP:  [name], MD   [facility] Providers Cardiologist:  [name], MD        Chief Complaint:  Chest pain  Patient Profile:   Troy Banks is a 81 y.o. male with CAD s/p CABG who is being seen 12/02/2021 for the evaluation of unstable angina.  History of Present Illness:   Troy Banks is a 81 yo male seen with his wife (former CCU nurse) at the bedside who presents for unstable angina.  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 He presents with two episodes of typical angina since his visit with Dr. [name] on 11/18/21.   Notes that first episode was nitro responsive, shortly after his visit with Dr. [name]. However, today's episode did not respond to 3 nitro and his wife called EMS. He continues to have chest pain in the ED. Note that his BP is elevated which is unusual for him.   No DOE, no palpitations, no syncope.   Labs unremarkable including troponin which is 8, and 7.   EKG shows sinus bradycardia, first deg AVB and nonspecific ST abnormality. Tele shows sinus brady with rates in the 40s-50s,SR.  Past Medical History:  Diagnosis Date   Allergy    Anxiety    PTSD   Aortic stenosis, moderate 03/27/2015   post AVR echo Echo 11/16: Mild LVH, EF 55-60%, normal wall motion, grade 1 diastolic dysfunction, bioprosthetic AVR okay (mean gradient 18 mmHg) with trivial AI, mild LAE, atrial septal aneurysm, small effusion    Arthritis    "qwhere" (10/29/2015)   Cancer ([place])    skin cancer   Carotid artery stenosis    Carotid US 11/17: 1-39% bilateral ICA stenosis. F/u prn   Cervical stenosis of spinal canal    fusion 2006   CHF (congestive heart failure) ([place])    "secondry to OHS"   Closed left pilon fracture    COLONIC POLYPS, HX OF     Complication of anesthesia    "takes a lot to put him under and has woken up during surgery" Difficult to wake up . PTSD; "does not metabolize RX well"that he gets "violent", per pt.    Coronary artery disease    DIVERTICULOSIS, COLON    Dysrhythmia    2 bouts of afib post op and at home but corrected with medication.   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 ([place])    confirmed heterozygous 10/24/14 labs   PTSD (post-traumatic stress disorder)    PTSD (post-traumatic stress disorder)    Sleep apnea    On a CPAP  Past Surgical History:  Procedure Laterality Date   ANKLE FUSION Left 03/17/2017   Procedure: FUSION  PILON FRACTURE WITH BONE GRAFTING;  Surgeon: Shona Needles, MD;  Location: [place];  Service: Orthopedics;  Laterality: Left;   ANTERIOR FUSION CERVICAL SPINE  2002   AORTIC VALVE REPLACEMENT N/A 03/30/2015   Procedure: AORTIC VALVE REPLACEMENT (AVR);  Surgeon: Grace Isaac, MD;  Location: [place];  Service: Open Heart Surgery;  Laterality: N/A;   CARDIAC CATHETERIZATION N/A 03/26/2015   Procedure: Left Heart Cath and Coronary Angiography;  Surgeon: Leonie Man, MD;  Location: [place] CV [facility];  Service: Cardiovascular;  Laterality: N/A;   CARDIAC VALVE REPLACEMENT     COLONOSCOPY W/ POLYPECTOMY     CORONARY ARTERY BYPASS GRAFT N/A 03/30/2015   Procedure: CORONARY ARTERY BYPASS GRAFTING (CABG) x four, using left internal  mammary artery and left    leg greater saphenous vein harvested endoscopically ;  Surgeon: Grace Isaac, MD;  Location: [place];  Service: Open Heart Surgery;  Laterality: N/A;   CYSTOSCOPY/URETEROSCOPY/HOLMIUM LASER/STENT PLACEMENT Bilateral 11/07/2016   Procedure: CYSTOSCOPY/URETEROSCOPY/HOLMIUM LASER/STENT PLACEMENT;  Surgeon: Nickie Retort, MD;  Location: [facility];  Service: Urology;  Laterality: Bilateral;   CYSTOSCOPY/URETEROSCOPY/HOLMIUM LASER/STENT PLACEMENT Bilateral 11/23/2016   Procedure: CYSTOSCOPY/BILATERAL URETEROSCOPY/HOLMIUM LASER/STENT EXCHANGE;  Surgeon: Nickie Retort, MD;  Location: [facility];  Service: Urology;  Laterality: Bilateral;  NEEDS 90 MIN    EXTERNAL FIXATION LEG Left 02/22/2017   Procedure: EXTERNAL FIXATION LEFT ANKLE;  Surgeon: Renette Butters, MD;  Location: [place];  Service: Orthopedics;  Laterality: Left;   HEMORRHOID BANDING  1970s   INGUINAL HERNIA REPAIR Left [place]   "cut me open"   LEFT HEART CATH AND CORS/GRAFTS ANGIOGRAPHY N/A 10/25/2018   Procedure: LEFT HEART CATH AND CORS/GRAFTS ANGIOGRAPHY;  Surgeon: Leonie Man, MD;  Location: [place] [facility];  Service: Cardiovascular;  Laterality: N/A;   POSTERIOR FUSION CERVICAL SPINE  2006   TEE WITHOUT CARDIOVERSION N/A 03/30/2015   Procedure: TRANSESOPHAGEAL ECHOCARDIOGRAM (TEE);  Surgeon: Grace Isaac, MD;  Location: [place];  Service: Open Heart Surgery;  Laterality: N/A;   TESTICLE TORSION REDUCTION Right 1960   TOE FUSION Left 1985 & 2004   "great toe" and removal   TONSILLECTOMY     TOTAL HIP ARTHROPLASTY Right 10/27/2015   Procedure: RIGHT TOTAL HIP ARTHROPLASTY ANTERIOR APPROACH and steroid injection right foot;  Surgeon: Mcarthur Rossetti, MD;  Location: [place];  Service: Orthopedics;  Laterality: Right;   TOTAL HIP ARTHROPLASTY Left 08/13/2019   TOTAL HIP ARTHROPLASTY Left 08/13/2019   Procedure: LEFT TOTAL HIP ARTHROPLASTY ANTERIOR APPROACH;  Surgeon:  Mcarthur Rossetti, MD;  Location: [place];  Service: Orthopedics;  Laterality: Left;     Medications Prior to Admission: Prior to Admission medications   Medication Sig Start Date End Date Taking? Authorizing Provider  acetaminophen (TYLENOL) 500 MG tablet Take 1,000 mg by mouth every 6 (six) hours as needed for moderate pain. 12/18/17  Yes [provider]  allopurinol (ZYLOPRIM) 300 MG tablet TAKE 1 TABLET BY MOUTH EVERY DAY Patient taking differently: Take 300 mg by mouth daily. 06/14/21  Yes [name], MD  amLODipine (NORVASC) 10 MG tablet TAKE 1 TABLET BY MOUTH EVERY DAY Patient taking differently: Take 10 mg by mouth every evening. 07/07/21  Yes [name], MD  Ascorbic Acid (VITAMIN C) 1000 MG tablet Take 1,000 mg by mouth daily.  Yes [provider]  aspirin EC 81 MG tablet Take 81 mg by mouth every other day. Swallow whole.   Yes [provider]  calcium carbonate (TUMS EX) 750 MG chewable tablet Chew 1 tablet by mouth daily as needed for heartburn.    Yes [provider]  Carboxymethylcellulose Sodium (LUBRICANT EYE DROPS OP) Place 1 drop into both eyes 2 (two) times daily as needed (dry/irritated eyes.).    Yes [provider]  Cholecalciferol (VITAMIN D-3) 25 MCG (1000 UT) CAPS Take 1,000 Units by mouth every other day.   Yes [provider]  famotidine (PEPCID) 40 MG tablet TAKE 1 TABLET BY MOUTH EVERY DAY Patient taking differently: Take 40 mg by mouth daily. 08/16/21  Yes [name], MD  furosemide (LASIX) 40 MG tablet TAKE 1 TABLET BY MOUTH EVERY OTHER DAY Patient taking differently: Take 40 mg by mouth every other day. 11/15/21  Yes [name], [name], MD  gabapentin (NEURONTIN) 300 MG capsule Take 1 capsule (300 mg total) by mouth 2 (two) times daily. 11/11/21  Yes [name], [name], DO  levothyroxine (SYNTHROID) 25 MCG tablet TAKE 1 TABLET BY MOUTH EVERY DAY WITH BREAKFAST Patient taking differently: Take 25 mcg by mouth  daily before breakfast. 06/29/21  Yes [name], MD  nitroGLYCERIN (NITROSTAT) 0.4 MG SL tablet PLACE 1 TABLET UNDER THE TONGUE EVERY 5 MINUTES AS NEEDED FOR CHEST PAIN. Patient taking differently: Place 0.4 mg under the tongue every 5 (five) minutes as needed for chest pain. 11/30/21  Yes [name], [name], MD  primidone (MYSOLINE) 50 MG tablet Take 1 tablet (50 mg total) by mouth 2 (two) times daily. 05/10/21  Yes [name], [name], DO  rosuvastatin (CRESTOR) 10 MG tablet TAKE 1/2 TABLET BY MOUTH EVERY DAY Patient taking differently: Take 5 mg by mouth every evening. 12/01/21  Yes Minus Breeding, MD  tiZANidine (ZANAFLEX) 4 MG tablet Take 2 mg by mouth every 8 (eight) hours as needed for muscle spasms. 12/18/17  Yes [provider]  Turmeric 500 MG CAPS Take 500 mg by mouth daily.    Yes [provider]  vitamin B-12 (CYANOCOBALAMIN) 1000 MCG tablet Take 1,000 mcg by mouth every other day.   Yes [provider]  ranitidine (ZANTAC) 150 MG tablet TAKE 1 TABLET BY MOUTH 2 TIMES DAILY Patient taking differently: No sig reported 09/05/18 12/13/18  [name], MD     Allergies:    Allergies  Allergen Reactions   Acyclovir And Related Other (See Comments)    Accumulates and causes stroke-like side-effects due to cytochrome P450 enzyme deficiency   Benzodiazepines Other (See Comments)    Patient states he gets stroke symptoms with benzo's.    Compazine [Prochlorperazine Maleate] Other (See Comments)    Accumulates and causes stroke-like side-effects due to cytochrome P450 enzyme deficiency   Coreg [Carvedilol] Other (See Comments)    Accumulates and causes stroke-like side-effects due to cytochrome P450 enzyme deficiency Patient poor metabolizer of CYP2D6 - Coreg undergoes extensive hepatic (including 2D6)   Flomax [Tamsulosin Hcl] Other (See Comments)    Accumulates and causes stroke-like side-effects due to cytochrome P450 enzyme deficiency   Lisinopril Other (See  Comments)    Hyperkalemia. 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   Mobic [Meloxicam] Other (See Comments)    Accumulates and causes stroke-like side-effects due to cytochrome P450 enzyme deficiency   Ondansetron Other (See Comments)    Accumulates and causes stroke-like  side-effects due to cytochrome P450 enzyme deficiency    Oxycodone Other (See Comments)    Accumulates and causes stroke-like side-effects due to cytochrome P450 enzyme deficiency   Promethazine Other (See Comments)    Accumulates and causes stroke-like side-effects due to cytochrome P450 enzyme deficiency   Sertraline Hcl Other (See Comments)    Accumulates and causes stroke-like side-effects due to cytochrome P450 enzyme deficiency   Tramadol Other (See Comments)    Accumulates and causes stroke-like side-effects due to cytochrome P450 enzyme deficiency   Atorvastatin Other (See Comments)    MYALGIAS   Colchicine Other (See Comments)    ataxia   Fentanyl Other (See Comments)    Accumulates and cause stroke like symptoms    Social History:   Social History   Socioeconomic History   Marital status: Married    Spouse name: Troy Bongo, RN   Number of children: 2   Years of education: Not on file   Highest education level: Some college, no degree  Occupational History   Occupation: Retired Animal nutritionist  Tobacco Use   Smoking status: Some Days    Packs/day: 1.00    Years: 34.00    Total pack years: 34.00    Types: Cigarettes    Last attempt to quit: 06/06/1990    Years since quitting: 31.5   Smokeless tobacco: Never  Vaping Use   Vaping Use: Never used  Substance and Sexual Activity   Alcohol use: Yes    Alcohol/week: 3.0 standard drinks of alcohol    Types: 1 Cans of beer, 2 Shots of liquor per week    Comment: occassional   Drug use: No   Sexual activity: Yes  Other Topics Concern   Not on file  Social History Narrative   Left Handed     One Story home    Social Determinants of Health   Financial Resource Strain: Low Risk  (08/30/2021)   Overall Financial Resource Strain (CARDIA)    Difficulty of Paying Living Expenses: Not hard at all  Food Insecurity: No Food Insecurity (08/30/2021)   Hunger Vital Sign    Worried About Running Out of Food in the Last Year: Never true    Ran Out of Food in the Last Year: Never true  Transportation Needs: No Transportation Needs (08/30/2021)   PRAPARE - Hydrologist (Medical): No    Lack of Transportation (Non-Medical): No  Physical Activity: Insufficiently Active (08/30/2021)   Exercise Vital Sign    Days of Exercise per Week: 3 days    Minutes of Exercise per Session: 30 min  Stress: No Stress Concern Present (08/30/2021)   [place]    Feeling of Stress : Not at all  Social Connections: Moderately Integrated (08/30/2021)   Social Connection and Isolation Panel [NHANES]    Frequency of Communication with Friends and Family: Three times a week    Frequency of Social Gatherings with Friends and Family: Three times a week    Attends Religious Services: Never    Active Member of Clubs or Organizations: Yes    Attends Archivist Meetings: 1 to 4 times per year    Marital Status: Living with partner  Intimate Partner Violence: Not At Risk (08/30/2021)   Humiliation, Afraid, Rape, and Kick questionnaire    Fear of Current or Ex-Partner: No    Emotionally Abused: No    Physically Abused: No    Sexually  Abused: No    Family History:   The patient's family history includes Alcohol abuse in an other family member; Arthritis in an other family member; Diabetes in his mother and paternal grandfather; Drug abuse in his sister; Healthy in his brother, daughter, and son; Heart disease in his father, maternal grandfather, maternal grandmother, mother, and paternal grandfather; Hypertension in  his father, maternal grandfather, maternal grandmother, mother, and paternal grandfather; Prostate cancer in his maternal uncle; Tremor in his sister. There is no history of Colon polyps, Esophageal cancer, Rectal cancer, Stomach cancer, or Colon cancer.    ROS:  Please see the history of present illness.  All other ROS reviewed and negative.     Physical Exam/Data:   Vitals:   12/02/21 1900 12/02/21 1915 12/02/21 1935 12/02/21 2030  BP: (!) 170/78 (!) 185/91  (!) 162/82  Pulse: (!) 48 (!) 48  (!) 58  Resp: 19 (!) 23  16  Temp:   97.7 F (36.5 C)   TempSrc:   Oral   SpO2: 96% 97%  98%  Weight:      Height:        Intake/Output Summary (Last 24 hours) at 12/02/2021 2047 Last data filed at 12/02/2021 2006 Gross per 24 hour  Intake --  Output 600 ml  Net -600 ml      12/02/2021    3:50 PM 11/24/2021    9:18 AM 11/18/2021   11:16 AM  Last 3 Weights  Weight (lbs) 214 lb 214 lb 6.4 oz 214 lb  Weight (kg) 97.07 kg 97.251 kg 97.07 kg     Body mass index is 32.54 kg/m.  Constitutional: No acute distress Eyes: sclera non-icteric, xanthoma right eyelid ENMT: normal dentition, moist mucous membranes Cardiovascular: regular rhythm, normal rate, 1/6 apical systolic murmur. S1 and S2 normal. No jugular venous distention.  Respiratory: clear to auscultation bilaterally GI : normal bowel sounds, soft and nontender. No distention.   MSK: extremities warm, well perfused. No edema.  NEURO: grossly nonfocal exam, moves all extremities. PSYCH: alert and oriented x 3, normal mood and affect.     EKG:  The ECG that was done today was personally reviewed and demonstrates sinus brady first deg AVB, nonspec ST abnl  Relevant CV Studies: Repeat echo and cath pending.  Laboratory Data:  High Sensitivity Troponin:   Recent Labs  Lab 12/02/21 1544 12/02/21 1803  TROPONINIHS 8 7      Chemistry Recent Labs  Lab 12/02/21 1544  NA 141  K 3.7  CL 105  CO2 25  GLUCOSE 108*  BUN 13   CREATININE 0.94  CALCIUM 9.1  GFRNONAA >60  ANIONGAP 11    No results for input(s): "PROT", "ALBUMIN", "AST", "ALT", "ALKPHOS", "BILITOT" in the last 168 hours. Lipids No results for input(s): "CHOL", "TRIG", "HDL", "LABVLDL", "LDLCALC", "CHOLHDL" in the last 168 hours. Hematology Recent Labs  Lab 12/02/21 1544  WBC 7.1  RBC 4.96  HGB 15.6  HCT 45.6  MCV 91.9  MCH 31.5  MCHC 34.2  RDW 13.6  PLT 152   Thyroid No results for input(s): "TSH", "FREET4" in the last 168 hours. BNPNo results for input(s): "BNP", "PROBNP" in the last 168 hours.  DDimer No results for input(s): "DDIMER" in the last 168 hours.   Radiology/Studies:  DG Chest 2 View  Result Date: 12/02/2021 CLINICAL DATA:  Chest pain. EXAM: CHEST - 2 VIEW COMPARISON:  Chest radiograph dated September 29, 2020 FINDINGS: The heart size and mediastinal contours are  within normal limits. Evidence of prior coronary artery bypass grafting and prosthetic aortic valve. Both lungs are clear. Thoracic spondylosis. No acute osseous abnormality. Sternotomy wires are intact. IMPRESSION: No active cardiopulmonary disease. Electronically Signed   By: Keane Police D.O.   On: 12/02/2021 16:12     Assessment and Plan:   Principal Problem:   Unstable angina ([place])  Treat as unstable angina given symptoms reminiscent of his angina and responsive to nitro. Recently seen in office, determined that he would contact office if angina increased. Since it has, he may be a good candidate for repeat LHC. He has 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 and is a poor metabolizer of certain medications. Will need to consider this in the context of potential intervention and medication titration. Would seek assistance from cardiovascular pharmacists.   Consented for cath with wife present, given some concerns for memory and comprehension. Would verify in am prior to cath. Needs to be NPO at midnight. - Iv heparin - IV nitro for HTN and chest pain as tolerated -  no BB with bradycardia - continue statin and ASA 81 mg daily - continue amlodipine 10 mg daily  INFORMED CONSENT: I have reviewed the risks, indications, and alternatives to cardiac catheterization, possible angioplasty, and stenting with the patient. Risks include but are not limited to bleeding, infection, vascular injury, stroke, myocardial infarction, arrhythmia, kidney injury, radiation-related injury in the case of prolonged fluoroscopy use, emergency cardiac surgery, and death. The patient understands the risks of serious complication is 1-2 in 3235 with diagnostic cardiac cath and 1-2% or less with angioplasty/stenting.    Risk Assessment/Risk Scores:    TIMI Risk Score for Unstable Angina or Non-ST Elevation MI:   The patient's TIMI risk score is 4, which indicates a 20% risk of all cause mortality, new or recurrent myocardial infarction or need for urgent revascularization in the next 14 days.       Severity of Illness: The appropriate patient status for this patient is INPATIENT. Inpatient status is judged to be reasonable and necessary in order to provide the required intensity of service to ensure the patient's safety. The patient's presenting symptoms, physical exam findings, and initial radiographic and laboratory data in the context of their chronic comorbidities is felt to place them at high risk for further clinical deterioration. Furthermore, it is not anticipated that the patient will be medically stable for discharge from the hospital within 2 midnights of admission.   * I certify that at the point of admission it is my clinical judgment that the patient will require inpatient hospital care spanning beyond 2 midnights from the point of admission due to high intensity of service, high risk for further deterioration and high frequency of surveillance required.*   For questions or updates, please contact [place] Please consult [email or web address] for contact info under      Signed, Elouise Munroe, MD  12/02/2021 8:47 PM

## 2021-12-02 NOTE — ED Triage Notes (Signed)
PER EMS: pt from home with c/o CP "pressure" that radiates to his back associated with weakness and SOB; onset at 2am today. He took 3 SL nitro tabs at home which did help but did not go away completely. EMS did adm one additional SL nitro tab (total of 4) as well as '324mg'$  of aspirin. BP 168/102, HR- 58, RR-16, O2-95% RA, CBG-120 18g L-hand

## 2021-12-02 NOTE — Progress Notes (Signed)
Pt on phone with wife who may bring in his home unit. Pt offered use of our unit with nasal mask but refused at this time. Pt stated if he was unable to get his unit he would be be fine for one night. I will follow-up with him after his pone call.

## 2021-12-02 NOTE — ED Notes (Signed)
Meal tray ordered for patient.

## 2021-12-02 NOTE — Telephone Encounter (Signed)
Spoke to patient's wife.She stated husband started having chest pain late this morning with pain radiating into back.Stated he took NTG x 3 with no relief.He is presently at [facility] ED.Advised Dr.Jordan is out of office this afternoon.I will make him aware.

## 2021-12-02 NOTE — ED Notes (Signed)
This RN verified with pharmacy and Dr Langston Masker that we are going to start nitro gtt at this time

## 2021-12-03 ENCOUNTER — Encounter (HOSPITAL_COMMUNITY): Payer: Self-pay | Admitting: Cardiology

## 2021-12-03 ENCOUNTER — Encounter (HOSPITAL_COMMUNITY)
Admission: EM | Disposition: A | Discharge status: Home / Self Care | Payer: Self-pay | Source: Home / Self Care | Attending: Cardiology

## 2021-12-03 ENCOUNTER — Other Ambulatory Visit (HOSPITAL_COMMUNITY): Payer: Medicare Other

## 2021-12-03 DIAGNOSIS — I25110 Atherosclerotic heart disease of native coronary artery with unstable angina pectoris: Secondary | ICD-10-CM | POA: Diagnosis not present

## 2021-12-03 DIAGNOSIS — I200 Unstable angina: Secondary | ICD-10-CM | POA: Diagnosis not present

## 2021-12-03 HISTORY — PX: LEFT HEART CATH AND CORS/GRAFTS ANGIOGRAPHY: CATH118250

## 2021-12-03 HISTORY — PX: CORONARY/GRAFT ANGIOGRAPHY: CATH118237

## 2021-12-03 LAB — CBC
HCT: 39.8 % (ref 39.0–52.0)
Hemoglobin: 13.8 g/dL (ref 13.0–17.0)
MCH: 31.7 pg (ref 26.0–34.0)
MCHC: 34.7 g/dL (ref 30.0–36.0)
MCV: 91.5 fL (ref 80.0–100.0)
Platelets: 129 10*3/uL — ABNORMAL LOW (ref 150–400)
RBC: 4.35 MIL/uL (ref 4.22–5.81)
RDW: 13.5 % (ref 11.5–15.5)
WBC: 5.5 10*3/uL (ref 4.0–10.5)
nRBC: 0 % (ref 0.0–0.2)

## 2021-12-03 LAB — BASIC METABOLIC PANEL
Anion gap: 9 (ref 5–15)
BUN: 13 mg/dL (ref 8–23)
CO2: 22 mmol/L (ref 22–32)
Calcium: 8.4 mg/dL — ABNORMAL LOW (ref 8.9–10.3)
Chloride: 110 mmol/L (ref 98–111)
Creatinine, Ser: 0.94 mg/dL (ref 0.61–1.24)
GFR, Estimated: >60 mL/min (ref >60)
Glucose, Bld: 124 mg/dL — ABNORMAL HIGH (ref 70–99)
Potassium: 3.7 mmol/L (ref 3.5–5.1)
Sodium: 141 mmol/L (ref 135–145)

## 2021-12-03 LAB — HEPARIN LEVEL (UNFRACTIONATED): Heparin Unfractionated: >1.1 IU/mL — ABNORMAL HIGH (ref 0.30–0.70)

## 2021-12-03 LAB — LIPID PANEL
Cholesterol: 118 mg/dL (ref 0–200)
HDL: 35 mg/dL — ABNORMAL LOW (ref >40)
LDL Cholesterol: 48 mg/dL (ref 0–99)
Total CHOL/HDL Ratio: 3.4 RATIO
Triglycerides: 177 mg/dL — ABNORMAL HIGH (ref <150)
VLDL: 35 mg/dL (ref 0–40)

## 2021-12-03 SURGERY — LEFT HEART CATH AND CORS/GRAFTS ANGIOGRAPHY
Anesthesia: LOCAL

## 2021-12-03 MED ORDER — HEPARIN (PORCINE) 25000 UT/250ML-% IV SOLN
1000.0000 [IU]/h | INTRAVENOUS | Status: DC
  Administered 2021-12-03: 1000 [IU]/h via INTRAVENOUS

## 2021-12-03 MED ORDER — LIDOCAINE HCL (PF) 1 % IJ SOLN
INTRAMUSCULAR | Status: AC
  Filled 2021-12-03: qty 30

## 2021-12-03 MED ORDER — ASPIRIN 81 MG PO CHEW: 81.0000 mg | CHEWABLE_TABLET | ORAL | Status: DC

## 2021-12-03 MED ORDER — SODIUM CHLORIDE 0.9 % WEIGHT BASED INFUSION
1.0000 mL/kg/h | INTRAVENOUS | Status: AC
  Administered 2021-12-03: 1 mL/kg/h via INTRAVENOUS

## 2021-12-03 MED ORDER — SODIUM CHLORIDE 0.9% FLUSH: 3.0000 mL | INTRAVENOUS | Status: DC | PRN

## 2021-12-03 MED ORDER — SODIUM CHLORIDE 0.9 % WEIGHT BASED INFUSION
3.0000 mL/kg/h | INTRAVENOUS | Status: DC
  Administered 2021-12-03: 3 mL/kg/h via INTRAVENOUS

## 2021-12-03 MED ORDER — HEPARIN (PORCINE) IN NACL 1000-0.9 UT/500ML-% IV SOLN
INTRAVENOUS | Status: DC | PRN
  Administered 2021-12-03 (×2): 500 mL

## 2021-12-03 MED ORDER — PRIMIDONE 50 MG PO TABS
50.0000 mg | ORAL_TABLET | Freq: Two times a day (BID) | ORAL | Status: DC
  Administered 2021-12-03 – 2021-12-04 (×3): 50 mg via ORAL
  Filled 2021-12-03 (×3): qty 1

## 2021-12-03 MED ORDER — SODIUM CHLORIDE 0.9 % WEIGHT BASED INFUSION: 1.0000 mL/kg/h | INTRAVENOUS | Status: DC

## 2021-12-03 MED ORDER — SODIUM CHLORIDE 0.9 % IV SOLN
250.0000 mL | INTRAVENOUS | Status: DC | PRN
Start: 2021-12-03 — End: 2021-12-04

## 2021-12-03 MED ORDER — LEVOTHYROXINE SODIUM 25 MCG PO TABS
25.0000 ug | ORAL_TABLET | Freq: Every day | ORAL | Status: DC
  Administered 2021-12-04: 25 ug via ORAL
  Filled 2021-12-03: qty 1

## 2021-12-03 MED ORDER — SODIUM CHLORIDE 0.9% FLUSH
3.0000 mL | Freq: Two times a day (BID) | INTRAVENOUS | Status: DC
  Administered 2021-12-03: 3 mL via INTRAVENOUS

## 2021-12-03 MED ORDER — IOHEXOL 350 MG/ML SOLN
INTRAVENOUS | Status: DC | PRN
  Administered 2021-12-03: 90 mL

## 2021-12-03 MED ORDER — FAMOTIDINE 20 MG PO TABS
40.0000 mg | ORAL_TABLET | Freq: Every day | ORAL | Status: DC
  Administered 2021-12-03 – 2021-12-04 (×2): 40 mg via ORAL
  Filled 2021-12-03 (×2): qty 2

## 2021-12-03 MED ORDER — GABAPENTIN 300 MG PO CAPS
300.0000 mg | ORAL_CAPSULE | Freq: Two times a day (BID) | ORAL | Status: DC
  Administered 2021-12-03 – 2021-12-04 (×3): 300 mg via ORAL
  Filled 2021-12-03 (×3): qty 1

## 2021-12-03 MED ORDER — ALLOPURINOL 300 MG PO TABS
300.0000 mg | ORAL_TABLET | Freq: Every day | ORAL | Status: DC
  Administered 2021-12-03 – 2021-12-04 (×2): 300 mg via ORAL
  Filled 2021-12-03 (×2): qty 1

## 2021-12-03 MED ORDER — SODIUM CHLORIDE 0.9 % WEIGHT BASED INFUSION
1.0000 mL/kg/h | INTRAVENOUS | Status: DC
  Administered 2021-12-03: 1 mL/kg/h via INTRAVENOUS

## 2021-12-03 MED ORDER — SODIUM CHLORIDE 0.9% FLUSH: 3.0000 mL | Freq: Two times a day (BID) | INTRAVENOUS | Status: DC

## 2021-12-03 MED ORDER — SODIUM CHLORIDE 0.9 % IV SOLN: 250.0000 mL | INTRAVENOUS | Status: DC | PRN

## 2021-12-03 MED ORDER — ROSUVASTATIN CALCIUM 5 MG PO TABS
5.0000 mg | ORAL_TABLET | Freq: Every evening | ORAL | Status: DC
  Administered 2021-12-03: 5 mg via ORAL
  Filled 2021-12-03: qty 1

## 2021-12-03 MED ORDER — HEPARIN SODIUM (PORCINE) 1000 UNIT/ML IJ SOLN
INTRAMUSCULAR | Status: AC
  Filled 2021-12-03: qty 10

## 2021-12-03 MED ORDER — LIDOCAINE HCL (PF) 1 % IJ SOLN
INTRAMUSCULAR | Status: DC | PRN
  Administered 2021-12-03: 15 mL

## 2021-12-03 MED ORDER — HEPARIN (PORCINE) IN NACL 1000-0.9 UT/500ML-% IV SOLN
INTRAVENOUS | Status: AC
  Filled 2021-12-03: qty 1000

## 2021-12-03 MED ORDER — TIZANIDINE HCL 2 MG PO TABS
2.0000 mg | ORAL_TABLET | Freq: Three times a day (TID) | ORAL | Status: DC | PRN
  Administered 2021-12-03: 2 mg via ORAL
  Filled 2021-12-03 (×2): qty 1

## 2021-12-03 MED ORDER — VERAPAMIL HCL 2.5 MG/ML IV SOLN
INTRAVENOUS | Status: AC
  Filled 2021-12-03: qty 2

## 2021-12-03 MED ORDER — AMLODIPINE BESYLATE 10 MG PO TABS: 10.0000 mg | ORAL_TABLET | Freq: Every evening | ORAL | Status: DC

## 2021-12-03 MED ORDER — ISOSORBIDE MONONITRATE ER 30 MG PO TB24
30.0000 mg | ORAL_TABLET | Freq: Every day | ORAL | Status: DC
  Administered 2021-12-03 – 2021-12-04 (×2): 30 mg via ORAL
  Filled 2021-12-03 (×2): qty 1

## 2021-12-03 MED ORDER — SODIUM CHLORIDE 0.9 % WEIGHT BASED INFUSION: 3.0000 mL/kg/h | INTRAVENOUS | Status: DC

## 2021-12-03 SURGICAL SUPPLY — 11 items
CATH INFINITI 5 FR IM (CATHETERS) ×1 IMPLANT
CATH INFINITI 5FR MULTPACK ANG (CATHETERS) ×1 IMPLANT
CLOSURE MYNX CONTROL 5F (Vascular Products) ×1 IMPLANT
KIT HEART LEFT (KITS) ×3 IMPLANT
MAT PREVALON FULL STRYKER (MISCELLANEOUS) ×1 IMPLANT
PACK CARDIAC CATHETERIZATION (CUSTOM PROCEDURE TRAY) ×3 IMPLANT
SHEATH PINNACLE 5F 10CM (SHEATH) ×1 IMPLANT
SHEATH PROBE COVER 6X72 (BAG) ×1 IMPLANT
TRANSDUCER W/STOPCOCK (MISCELLANEOUS) ×3 IMPLANT
TUBING CIL FLEX 10 FLL-RA (TUBING) ×3 IMPLANT
WIRE EMERALD 3MM-J .035X150CM (WIRE) ×1 IMPLANT

## 2021-12-03 NOTE — Interval H&P Note (Signed)
History and Physical Interval Note:  12/03/2021 10:14 AM  Troy Banks  has presented today for surgery, with the diagnosis of unstable angina.  The various methods of treatment have been discussed with the patient and family. After consideration of risks, benefits and other options for treatment, the patient has consented to  Procedure(s): LEFT HEART CATH AND CORS/GRAFTS ANGIOGRAPHY (N/A) as a surgical intervention.  The patient's history has been reviewed, patient examined, no change in status, stable for surgery.  I have reviewed the patient's chart and labs.  Questions were answered to the patient's satisfaction.    Cath Lab Visit (complete for each Cath Lab visit)  Clinical Evaluation Leading to the Procedure:   ACS: Yes.    Non-ACS:    Anginal Classification: CCS III  Anti-ischemic medical therapy: Maximal Therapy (2 or more classes of medications)  Non-Invasive Test Results: No non-invasive testing performed  Prior CABG: Previous CABG       [facility] 12/03/2021 10:14 AM

## 2021-12-03 NOTE — Progress Notes (Addendum)
Progress Note  Patient Name: Troy Banks Date of Encounter: 12/03/2021  Primary Cardiologist: [name], MD  Subjective   Patient seen post-cath. Had mild back pain earlier, reports this is chronic when laying flat or on hard surfaces. BP remains up. No CP, SOB, dizziness. Wife at bedside is a retired Warden/ranger. Their daughter is also a doctor.  Inpatient Medications    Scheduled Meds:  allopurinol  300 mg Oral Daily   amLODipine  10 mg Oral Daily   aspirin EC  81 mg Oral Daily   famotidine  40 mg Oral Daily   gabapentin  300 mg Oral BID   isosorbide mononitrate  30 mg Oral Daily   [START ON 12/04/2021] levothyroxine  25 mcg Oral Q0600   primidone  50 mg Oral BID   rosuvastatin  5 mg Oral QPM   sodium chloride flush  3 mL Intravenous Q12H   Continuous Infusions:  sodium chloride     sodium chloride 1 mL/kg/hr (12/03/21 1348)   PRN Meds: sodium chloride, acetaminophen, nitroGLYCERIN, ondansetron (ZOFRAN) IV, sodium chloride flush, tiZANidine   Vital Signs    Vitals:   12/03/21 1126 12/03/21 1156 12/03/21 1300 12/03/21 1315  BP: (!) 187/81 (!) 179/79 (!) 169/71 (!) 172/76  Pulse: (!) 59 100    Resp: '13 14 17 19  '$ Temp:  97.6 F (36.4 C)    TempSrc:  Oral    SpO2:   98% 97%  Weight:      Height:        Intake/Output Summary (Last 24 hours) at 12/03/2021 1417 Last data filed at 12/03/2021 1100 Gross per 24 hour  Intake 257.32 ml  Output 3750 ml  Net -3492.68 ml      12/02/2021    3:50 PM 11/24/2021    9:18 AM 11/18/2021   11:16 AM  Last 3 Weights  Weight (lbs) 214 lb 214 lb 6.4 oz 214 lb  Weight (kg) 97.07 kg 97.251 kg 97.07 kg     Telemetry    NSR/SB - Personally Reviewed  ECG    SB 52bpm nonspecific STTW changes - Personally Reviewed  Physical Exam   GEN: No acute distress.  HEENT: Normocephalic, atraumatic, sclera non-icteric. Neck: No JVD or bruits. Cardiac: RRR soft SEM no rubs or gallops.  Respiratory: Clear to auscultation  bilaterally. Breathing is unlabored. GI: Soft, nontender, non-distended, BS +x 4. MS: no deformity. Extremities: No clubbing or cyanosis. No edema. Distal pedal pulses are 2+ and equal bilaterally. Neuro:  AAOx3. Follows commands. Psych:  Responds to questions appropriately with a normal affect.  Labs    High Sensitivity Troponin:   Recent Labs  Lab 12/02/21 1544 12/02/21 1803  TROPONINIHS 8 7      Cardiac EnzymesNo results for input(s): "TROPONINI" in the last 168 hours. No results for input(s): "TROPIPOC" in the last 168 hours.   Chemistry Recent Labs  Lab 12/02/21 1544 12/03/21 0523  NA 141 141  K 3.7 3.7  CL 105 110  CO2 25 22  GLUCOSE 108* 124*  BUN 13 13  CREATININE 0.94 0.94  CALCIUM 9.1 8.4*  GFRNONAA >60 >60  ANIONGAP 11 9     Hematology Recent Labs  Lab 12/02/21 1544 12/03/21 0523  WBC 7.1 5.5  RBC 4.96 4.35  HGB 15.6 13.8  HCT 45.6 39.8  MCV 91.9 91.5  MCH 31.5 31.7  MCHC 34.2 34.7  RDW 13.6 13.5  PLT 152 129*    BNPNo results for input(s): "  BNP", "PROBNP" in the last 168 hours.   DDimer No results for input(s): "DDIMER" in the last 168 hours.   Radiology    CARDIAC CATHETERIZATION  Result Date: 12/03/2021   Ost LAD to Prox LAD lesion is 80% stenosed.   Mid LAD lesion is 100% stenosed.   Ost Cx lesion is 80% stenosed.   Prox RCA to Mid RCA lesion is 100% stenosed.   Ost Ramus-1 lesion is 90% stenosed.   Ost Ramus-2 lesion is 60% stenosed.   Prox Cx-2 lesion is 100% stenosed.   Prox Cx-1 lesion is 80% stenosed.   LIMA and is normal in caliber.   SVG and is large.   SVG and is large.   SVG and is large.   The graft exhibits no disease.   The graft exhibits minimal luminal irregularities.   The graft exhibits minimal luminal irregularities.   The graft exhibits no disease. 3 vessel obstructive CAD. All grafts are still patent No significant change from 2020. Plan: recommend medical therapy. Some of his angina could be explained by disease in the  native LCx proximal to a relatively small OM. Again this hasn't changed significantly.   DG Chest 2 View  Result Date: 12/02/2021 CLINICAL DATA:  Chest pain. EXAM: CHEST - 2 VIEW COMPARISON:  Chest radiograph dated September 29, 2020 FINDINGS: The heart size and mediastinal contours are within normal limits. Evidence of prior coronary artery bypass grafting and prosthetic aortic valve. Both lungs are clear. Thoracic spondylosis. No acute osseous abnormality. Sternotomy wires are intact. IMPRESSION: No active cardiopulmonary disease. Electronically Signed   By: Keane Police D.O.   On: 12/02/2021 16:12    Cardiac Studies   [facility] 12/03/21   Ost LAD to Prox LAD lesion is 80% stenosed.   Mid LAD lesion is 100% stenosed.   Ost Cx lesion is 80% stenosed.   Prox RCA to Mid RCA lesion is 100% stenosed.   Ost Ramus-1 lesion is 90% stenosed.   Ost Ramus-2 lesion is 60% stenosed.   Prox Cx-2 lesion is 100% stenosed.   Prox Cx-1 lesion is 80% stenosed.   LIMA and is normal in caliber.   SVG and is large.   SVG and is large.   SVG and is large.   The graft exhibits no disease.   The graft exhibits minimal luminal irregularities.   The graft exhibits minimal luminal irregularities.   The graft exhibits no disease.   3 vessel obstructive CAD.  All grafts are still patent No significant change from 2020.   Plan: recommend medical therapy. Some of his angina could be explained by disease in the native LCx proximal to a relatively small OM. Again this hasn't changed significantly.    Patient Profile     81 y.o. 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 Troponin was negative x2. BP up. Admitted for cath 12/03/21.  Assessment & Plan    1. Chest pain with angina  - cath reviewed above, no significant change from 2020, medical therapy recommended - Imdur added - continue ASA,  amlodipine, statin - avoid BB with baseline sinus bradycardia  2. H/o bioprosthetic AVR - repeat echo pending (last study 2020 with nromal valve function)  3. Essential HTN - per d/w Dr. Burt Knack, plan to follow with new Imdur - check AM TSH  4. Hyperlipidemia - continue rosuvastatin. Lipid panel with LDL  48, trig 177, HDL 35  Per d/w Dr. Wende Crease, plan post cath observation today on new Imdur with 2D echo pending and probable DC in AM if he does well overnight.  For questions or updates, please contact [place] Please consult [email or web address] for contact info under Cardiology/STEMI.  Signed, Charlie Pitter, [name] 12/03/2021, 2:17 PM    Patient seen, examined. Available data reviewed. Agree with findings, assessment, and plan as outlined by Melina Copa, [name].  Patient interviewed and examined with Ms. Idolina Primer.  He is alert, oriented, in no distress.  HEENT normal, JVP normal, lungs clear bilaterally, heart regular rate and rhythm with a 2/6 systolic murmur best heard at the left lower sternal border, right groin site is clear, no lower extremity edema.  Cardiac catheterization showed continued patency of his bypass grafts.  Discussed findings with the patient and his wife.  He is having more frequent angina and using some nitroglycerin on occasion.  Dr. Doug Sou notes are reviewed and we agree that isosorbide should be added.  We will observe him overnight with plans to discharge tomorrow morning on his current medical program as outlined above.  2D echo is currently pending.  Sherren Mocha, M.D. 12/03/2021 3:05 PM

## 2021-12-03 NOTE — ED Notes (Signed)
Verbal report given to [facility] G RN at this time

## 2021-12-03 NOTE — ED Notes (Signed)
This RN spoke with pharmacy in regards to patient's heparin level. Per pharmacy turn off heparin drip at this time.

## 2021-12-03 NOTE — Progress Notes (Signed)
[place] for Heparin Indication: chest pain/ACS Brief A/P: Heparin level supratherapeutic Decrease Heparin rate  Allergies  Allergen Reactions   Acyclovir And Related Other (See Comments)    Accumulates and causes stroke-like side-effects due to cytochrome P450 enzyme deficiency   Benzodiazepines Other (See Comments)    Patient states he gets stroke symptoms with benzo's.    Compazine [Prochlorperazine Maleate] Other (See Comments)    Accumulates and causes stroke-like side-effects due to cytochrome P450 enzyme deficiency   Coreg [Carvedilol] Other (See Comments)    Accumulates and causes stroke-like side-effects due to cytochrome P450 enzyme deficiency Patient poor metabolizer of CYP2D6 - Coreg undergoes extensive hepatic (including 2D6)   Flomax [Tamsulosin Hcl] Other (See Comments)    Accumulates and causes stroke-like side-effects due to cytochrome P450 enzyme deficiency   Lisinopril Other (See Comments)    Hyperkalemia. 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   Mobic [Meloxicam] Other (See Comments)    Accumulates and causes stroke-like side-effects due to cytochrome P450 enzyme deficiency   Ondansetron Other (See Comments)    Accumulates and causes stroke-like side-effects due to cytochrome P450 enzyme deficiency    Oxycodone Other (See Comments)    Accumulates and causes stroke-like side-effects due to cytochrome P450 enzyme deficiency   Promethazine Other (See Comments)    Accumulates and causes stroke-like side-effects due to cytochrome P450 enzyme deficiency   Sertraline Hcl Other (See Comments)    Accumulates and causes stroke-like side-effects due to cytochrome P450 enzyme deficiency   Tramadol Other (See Comments)    Accumulates and causes stroke-like side-effects due to cytochrome P450 enzyme deficiency   Atorvastatin Other (See Comments)    MYALGIAS   Colchicine Other (See  Comments)    ataxia   Fentanyl Other (See Comments)    Accumulates and cause stroke like symptoms    Patient Measurements: Height: '5\' 8"'$  (172.7 cm) Weight: 97.1 kg (214 lb) IBW/kg (Calculated) : 68.4 Heparin Dosing Weight: 89 kg  Vital Signs: Temp: 97.7 F (36.5 C) (06/29 1935) Temp Source: Oral (06/29 1935) BP: 156/70 (06/30 0200) Pulse Rate: 53 (06/30 0200)  Labs: Recent Labs    12/02/21 1544 12/02/21 1803 12/02/21 1946 12/03/21 0135  HGB 15.6  --   --   --   HCT 45.6  --   --   --   PLT 152  --   --   --   LABPROT  --   --  14.4  --   INR  --   --  1.1  --   HEPARINUNFRC  --   --   --  >1.10*  CREATININE 0.94  --   --   --   TROPONINIHS 8 7  --   --      Estimated Creatinine Clearance: 73.2 mL/min (by C-G formula based on SCr of 0.94 mg/dL).  Assessment: 81 y.o. male with chest pain for heparin   Goal of Therapy:  Heparin level 0.3-0.7 units/ml Monitor platelets by anticoagulation protocol: Yes   Plan:  Hold heparin x 45 min, then decrease heparin 1000 units/hr Check heparin level in 8 hours.  Phillis Knack, PharmD, BCPS

## 2021-12-03 NOTE — ED Notes (Signed)
Received verbal report from [place] at this time

## 2021-12-03 NOTE — Progress Notes (Signed)
Pt refused CPAP. He said he did not want to wear and he would be up soon anyway. Stated he would let me know if he needed it during the night.

## 2021-12-04 ENCOUNTER — Inpatient Hospital Stay (HOSPITAL_COMMUNITY): Payer: Medicare Other

## 2021-12-04 DIAGNOSIS — R079 Chest pain, unspecified: Secondary | ICD-10-CM

## 2021-12-04 DIAGNOSIS — I200 Unstable angina: Secondary | ICD-10-CM | POA: Diagnosis not present

## 2021-12-04 LAB — ECHOCARDIOGRAM COMPLETE
AR max vel: 1.19 cm2
AV Area VTI: 1.45 cm2
AV Area mean vel: 1.27 cm2
AV Mean grad: 16.5 mmHg
AV Peak grad: 29.7 mmHg
Ao pk vel: 2.73 m/s
Area-P 1/2: 2.94 cm2
Calc EF: 63.2 %
Height: 68 in
S' Lateral: 3.4 cm
Single Plane A2C EF: 64.9 %
Single Plane A4C EF: 62.5 %
Weight: 3284.8 oz

## 2021-12-04 LAB — BASIC METABOLIC PANEL
Anion gap: 11 (ref 5–15)
BUN: 19 mg/dL (ref 8–23)
CO2: 22 mmol/L (ref 22–32)
Calcium: 9.4 mg/dL (ref 8.9–10.3)
Chloride: 107 mmol/L (ref 98–111)
Creatinine, Ser: 1.17 mg/dL (ref 0.61–1.24)
GFR, Estimated: >60 mL/min (ref >60)
Glucose, Bld: 112 mg/dL — ABNORMAL HIGH (ref 70–99)
Potassium: 4.2 mmol/L (ref 3.5–5.1)
Sodium: 140 mmol/L (ref 135–145)

## 2021-12-04 LAB — CBC
HCT: 45 % (ref 39.0–52.0)
Hemoglobin: 15.2 g/dL (ref 13.0–17.0)
MCH: 31.4 pg (ref 26.0–34.0)
MCHC: 33.8 g/dL (ref 30.0–36.0)
MCV: 93 fL (ref 80.0–100.0)
Platelets: 140 10*3/uL — ABNORMAL LOW (ref 150–400)
RBC: 4.84 MIL/uL (ref 4.22–5.81)
RDW: 13.5 % (ref 11.5–15.5)
WBC: 7.8 10*3/uL (ref 4.0–10.5)
nRBC: 0 % (ref 0.0–0.2)

## 2021-12-04 LAB — TSH: TSH: 6.138 u[IU]/mL — ABNORMAL HIGH (ref 0.350–4.500)

## 2021-12-04 LAB — LIPOPROTEIN A (LPA): Lipoprotein (a): 21.6 nmol/L (ref <75.0)

## 2021-12-04 MED ORDER — ISOSORBIDE MONONITRATE ER 30 MG PO TB24: 30.0000 mg | ORAL_TABLET | Freq: Every day | ORAL | 11 refills | Status: DC

## 2021-12-04 NOTE — Discharge Summary (Signed)
Discharge Summary    Patient ID: Troy Banks MRN: [id number]; DOB: 05-06-1941  Admit date: 12/02/2021 Discharge date: 12/04/2021  PCP:  [name], MD   [place] [facility] Providers Cardiologist:  Ranesha Val [name], MD        Discharge Diagnoses    Principal Problem:   Unstable angina [facility]) Active Problems:   Coronary artery disease involving native coronary artery with angina pectoris [facility])   Essential hypertension   H/O tissue AVR Oct 2016   Dyslipidemia    Diagnostic Studies/Procedures    Cardiac catheterization 12/03/2021 LAD ost 80, mid 100 CTO RI ost 90, 60 LCx 80, prox 80, 100 RCA prox 100 CTO L-LAD patent S-RI patent S-dist LCx patent S-dist RCA patent  CONCLUSION: 3 vessel obstructive CAD.  All grafts are still patent No significant change from 2020.   Plan: recommend medical therapy. Some of his angina could be explained by disease in the native LCx proximal to a relatively small OM. Again this hasn't changed significantly.   _____________   History of Present Illness     Troy Banks is a 81 y.o. 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   Hospital Course     Consultants: none   He was admitted and started on IV Heparin, IV nitroglycerin. His hs-Troponins were negative x2. Cardiac catheterization was performed by Dr. [name] on 12/03/21 and demonstrated patent bypass grafts. Med Rx was recommended.  Isosorbide was added to his medical regimen. Echocardiogram was obtained but results are currently pending.  He has a hx of "white coat" hypertension.  BPs at home had been well controlled on Amlodipine alone in the past. He has not been placed on beta-blocker in the past due to bradycardia.  He has also not been placed on ACE/ARB/MRA due to high K+ in the past.  LDL was optimal on Rosuvastatin 5 mg once daily.  Triglycerides  were slightly elevated and this can be followed as an outpatient with addition of Vascepa if necessary.  He was seen by Dr. Johnsie Cancel this morning.  He is doing well without further angina.  He is felt to be stable and ready for discharge to home.    Did the patient have an acute coronary syndrome (MI, NSTEMI, STEMI, etc) this admission?:  No                               Did the patient have a percutaneous coronary intervention (stent / angioplasty)?:  No.          _____________  Discharge Vitals Blood pressure (!) 161/62, pulse (!) 47, temperature 98.1 F (36.7 C), temperature source Oral, resp. rate 20, height '5\' 8"'$  (1.727 m), weight 93.1 kg, SpO2 95 %.  Filed Weights   12/02/21 1550 12/04/21 0332  Weight: 97.1 kg 93.1 kg    Labs & Radiologic Studies    CBC Recent Labs    12/03/21 0523 12/04/21 0055  WBC 5.5 7.8  HGB 13.8 15.2  HCT 39.8 45.0  MCV 91.5 93.0  PLT 129* 716*   Basic Metabolic Panel Recent Labs    12/03/21 0523 12/04/21 0055  NA 141 140  K 3.7 4.2  CL 110 107  CO2 22 22  GLUCOSE 124* 112*  BUN 13 19  CREATININE 0.94 1.17  CALCIUM 8.4* 9.4  High Sensitivity Troponin:   Recent Labs  Lab 12/02/21 1544 12/02/21 1803  TROPONINIHS 8 7     Fasting Lipid Panel Recent Labs    12/03/21 0523  CHOL 118  HDL 35*  LDLCALC 48  TRIG 177*  CHOLHDL 3.4   Thyroid Function Tests Recent Labs    12/04/21 0055  TSH 6.138*   _____________   DG Chest 2 View Result Date: 12/02/2021 CLINICAL DATA:  Chest pain. EXAM: CHEST - 2 VIEW COMPARISON:  Chest radiograph dated September 29, 2020 FINDINGS: The heart size and mediastinal contours are within normal limits. Evidence of prior coronary artery bypass grafting and prosthetic aortic valve. Both lungs are clear. Thoracic spondylosis. No acute osseous abnormality. Sternotomy wires are intact. IMPRESSION: No active cardiopulmonary disease. Electronically Signed   By: Keane Police D.O.   On: 12/02/2021 16:12      Disposition   Pt is being discharged home today in good condition.  Follow-up Plans & Appointments     Follow-up Information     [name], Amante Fomby M, MD Follow up in 2 week(s).   Specialty: Cardiology Why: The office will call to arrange follow up with a PA, NP or Dr. [name] Contact information: [address] [place] [address] [phone]                Discharge Instructions     Diet - low sodium heart healthy   Complete by: As directed    Discharge wound care:   Complete by: As directed    Call for any groin swelling, bruising, bleeding.   Driving Restrictions   Complete by: As directed    No driving for 3 days   Increase activity slowly   Complete by: As directed    Lifting restrictions   Complete by: As directed    No lifting for 1 week.   Sexual Activity Restrictions   Complete by: As directed    None for 1 week       Discharge Medications   Allergies as of 12/04/2021       Reactions   Acyclovir And Related Other (See Comments)   Accumulates and causes stroke-like side-effects due to cytochrome P450 enzyme deficiency   Benzodiazepines Other (See Comments)   Patient states he gets stroke symptoms with benzo's.    Compazine [prochlorperazine Maleate] Other (See Comments)   Accumulates and causes stroke-like side-effects due to cytochrome P450 enzyme deficiency   Coreg [carvedilol] Other (See Comments)   Accumulates and causes stroke-like side-effects due to cytochrome P450 enzyme deficiency Patient poor metabolizer of CYP2D6 - Coreg undergoes extensive hepatic (including 2D6)   Flomax [tamsulosin Hcl] Other (See Comments)   Accumulates and causes stroke-like side-effects due to cytochrome P450 enzyme deficiency   Lisinopril Other (See Comments)   Hyperkalemia. 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   Mobic [meloxicam] Other (See Comments)   Accumulates and causes stroke-like  side-effects due to cytochrome P450 enzyme deficiency   Morphine And Related Other (See Comments)   Per pt's wife morphine also contraindicated due to p450 enzyme deficiency.    Ondansetron Other (See Comments)   Accumulates and causes stroke-like side-effects due to cytochrome P450 enzyme deficiency   Oxycodone Other (See Comments)   Accumulates and causes stroke-like side-effects due to cytochrome P450 enzyme deficiency   Promethazine Other (See Comments)   Accumulates and causes stroke-like side-effects due to cytochrome P450 enzyme deficiency   Sertraline Hcl Other (See Comments)  Accumulates and causes stroke-like side-effects due to cytochrome P450 enzyme deficiency   Tramadol Other (See Comments)   Accumulates and causes stroke-like side-effects due to cytochrome P450 enzyme deficiency   Atorvastatin Other (See Comments)   MYALGIAS   Colchicine Other (See Comments)   ataxia   Fentanyl Other (See Comments)   Accumulates and cause stroke like symptoms        Medication List     TAKE these medications    acetaminophen 500 MG tablet Commonly known as: TYLENOL Take 1,000 mg by mouth every 6 (six) hours as needed for moderate pain.   allopurinol 300 MG tablet Commonly known as: ZYLOPRIM TAKE 1 TABLET BY MOUTH EVERY DAY   amLODipine 10 MG tablet Commonly known as: NORVASC TAKE 1 TABLET BY MOUTH EVERY DAY What changed: when to take this   aspirin EC 81 MG tablet Take 81 mg by mouth every other day. Swallow whole.   calcium carbonate 750 MG chewable tablet Commonly known as: TUMS EX Chew 1 tablet by mouth daily as needed for heartburn.   famotidine 40 MG tablet Commonly known as: PEPCID TAKE 1 TABLET BY MOUTH EVERY DAY   furosemide 40 MG tablet Commonly known as: LASIX TAKE 1 TABLET BY MOUTH EVERY OTHER DAY   gabapentin 300 MG capsule Commonly known as: NEURONTIN Take 1 capsule (300 mg total) by mouth 2 (two) times daily.   isosorbide mononitrate 30 MG 24 hr  tablet Commonly known as: IMDUR Take 1 tablet (30 mg total) by mouth daily.   levothyroxine 25 MCG tablet Commonly known as: SYNTHROID TAKE 1 TABLET BY MOUTH EVERY DAY WITH BREAKFAST What changed: See the new instructions.   LUBRICANT EYE DROPS OP Place 1 drop into both eyes 2 (two) times daily as needed (dry/irritated eyes.).   nitroGLYCERIN 0.4 MG SL tablet Commonly known as: NITROSTAT PLACE 1 TABLET UNDER THE TONGUE EVERY 5 MINUTES AS NEEDED FOR CHEST PAIN. What changed: See the new instructions.   primidone 50 MG tablet Commonly known as: MYSOLINE Take 1 tablet (50 mg total) by mouth 2 (two) times daily.   rosuvastatin 10 MG tablet Commonly known as: CRESTOR TAKE 1/2 TABLET BY MOUTH EVERY DAY What changed: when to take this   tiZANidine 4 MG tablet Commonly known as: ZANAFLEX Take 2 mg by mouth every 8 (eight) hours as needed for muscle spasms.   Turmeric 500 MG Caps Take 500 mg by mouth daily.   vitamin B-12 1000 MCG tablet Commonly known as: CYANOCOBALAMIN Take 1,000 mcg by mouth every other day.   vitamin C 1000 MG tablet Take 1,000 mg by mouth daily.   Vitamin D-3 25 MCG (1000 UT) Caps Take 1,000 Units by mouth every other day.               Discharge Care Instructions  (From admission, onward)           Start     Ordered   12/04/21 0000  Discharge wound care:       Comments: Call for any groin swelling, bruising, bleeding.   12/04/21 0954               Outstanding Labs/Studies      Duration of Discharge Encounter   Greater than 30 minutes including physician time.  Signed, Richardson Dopp, [name] 12/04/2021, 9:59 AM  Patient examined chart reviewed Cath site a no hematoma Cath with patent grafts no intervention done [facility] for d/c home  Jenkins Rouge MD [facility]

## 2021-12-14 ENCOUNTER — Ambulatory Visit: Payer: Medicare Other | Admitting: Physician Assistant

## 2021-12-16 ENCOUNTER — Ambulatory Visit: Payer: Medicare Other | Admitting: Nurse Practitioner

## 2021-12-20 ENCOUNTER — Ambulatory Visit: Payer: Medicare Other | Admitting: Nurse Practitioner

## 2021-12-20 ENCOUNTER — Encounter: Payer: Self-pay | Admitting: Nurse Practitioner

## 2021-12-20 VITALS — BP 130/72 | HR 62 | Ht 68.0 in | Wt 209.6 lb

## 2021-12-20 DIAGNOSIS — I10 Essential (primary) hypertension: Secondary | ICD-10-CM | POA: Diagnosis not present

## 2021-12-20 DIAGNOSIS — G4733 Obstructive sleep apnea (adult) (pediatric): Secondary | ICD-10-CM

## 2021-12-20 DIAGNOSIS — I25118 Atherosclerotic heart disease of native coronary artery with other forms of angina pectoris: Secondary | ICD-10-CM | POA: Diagnosis not present

## 2021-12-20 DIAGNOSIS — E785 Hyperlipidemia, unspecified: Secondary | ICD-10-CM | POA: Diagnosis not present

## 2021-12-20 DIAGNOSIS — I6523 Occlusion and stenosis of bilateral carotid arteries: Secondary | ICD-10-CM

## 2021-12-20 DIAGNOSIS — Z952 Presence of prosthetic heart valve: Secondary | ICD-10-CM

## 2021-12-20 DIAGNOSIS — I350 Nonrheumatic aortic (valve) stenosis: Secondary | ICD-10-CM

## 2021-12-20 NOTE — Patient Instructions (Addendum)
Medication Instructions:  Your physician recommends that you continue on your current medications as directed. Please refer to the Current Medication list given to you today.  *If you need a refill on your cardiac medications before your next appointment, please call your pharmacy*   Lab Work: NONE ordered at this time of appointment   If you have labs (blood work) drawn today and your tests are completely normal, you will receive your results only by: [place] (if you have MyChart) OR A paper copy in the mail If you have any lab test that is abnormal or we need to change your treatment, we will call you to review the results.   Testing/Procedures: NONE ordered at this time of appointment     Follow-Up: At [facility], you and your health needs are our priority.  As part of our continuing mission to provide you with exceptional heart care, we have created designated Provider Care Teams.  These Care Teams include your primary Cardiologist (physician) and Advanced Practice Providers (APPs -  Physician Assistants and Nurse Practitioners) who all work together to provide you with the care you need, when you need it.  We recommend signing up for the patient portal called "MyChart".  Sign up information is provided on this After Visit Summary.  MyChart is used to connect with patients for Virtual Visits (Telemedicine).  Patients are able to view lab/test results, encounter notes, upcoming appointments, etc.  Non-urgent messages can be sent to your provider as well.   To learn more about what you can do with MyChart, go to [email or web address].    Your next appointment:    Keep 05/06/2022  The format for your next appointment:   In Person  Provider:   [name], MD     Other Instructions Keep a blood pressure log. Report blood pressure consistently greater than 130/80.   Important Information About Sugar

## 2021-12-20 NOTE — Progress Notes (Signed)
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  Past Medical History    Past Medical History:  Diagnosis Date   Allergy    Anxiety    PTSD   Aortic stenosis, moderate 03/27/2015   post AVR echo Echo 11/16: Mild LVH, EF 55-60%, normal wall motion, grade 1 diastolic dysfunction, bioprosthetic AVR okay (mean gradient 18 mmHg) with trivial AI, mild LAE, atrial septal aneurysm, small effusion    Arthritis    "qwhere" (10/29/2015)   Cancer ([place])    skin cancer   Carotid artery stenosis    Carotid US 11/17: 1-39% bilateral ICA stenosis. F/u prn   Cervical stenosis of spinal canal    fusion 2006   CHF (congestive heart failure) ([place])    "secondry to OHS"   Closed left pilon fracture    COLONIC POLYPS, HX OF    Complication of anesthesia    "takes a lot to put him under and has woken up during surgery" Difficult to wake up . PTSD; "does not metabolize RX well"that he gets "violent", per pt.    Coronary artery disease    DIVERTICULOSIS, COLON    Dysrhythmia    2 bouts of afib post op and at home but corrected with medication.   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 ([place])    confirmed heterozygous 10/24/14 labs   PTSD (post-traumatic stress disorder)    PTSD (post-traumatic stress disorder)    Sleep apnea    On a CPAP   Past Surgical History:  Procedure Laterality Date   ANKLE FUSION Left 03/17/2017   Procedure: FUSION  PILON FRACTURE WITH BONE GRAFTING;  Surgeon: Shona Needles, MD;  Location: [place];  Service: Orthopedics;  Laterality: Left;   ANTERIOR FUSION CERVICAL SPINE  2002   AORTIC VALVE REPLACEMENT N/A 03/30/2015   Procedure: AORTIC VALVE REPLACEMENT (AVR);  Surgeon: Grace Isaac, MD;  Location: [place];  Service: Open Heart Surgery;  Laterality: N/A;   CARDIAC CATHETERIZATION N/A 03/26/2015   Procedure: Left Heart Cath and Coronary Angiography;  Surgeon: Leonie Man, MD;  Location: [place] [facility];  Service: Cardiovascular;  Laterality: N/A;   CARDIAC VALVE REPLACEMENT     COLONOSCOPY W/ POLYPECTOMY     CORONARY ARTERY BYPASS GRAFT N/A 03/30/2015   Procedure: CORONARY ARTERY BYPASS GRAFTING (CABG) x four, using left internal mammary artery and left  leg greater saphenous vein harvested endoscopically ;  Surgeon: Grace Isaac, MD;  Location: [place];  Service: Open Heart Surgery;  Laterality: N/A;   CORONARY/GRAFT ANGIOGRAPHY N/A 12/03/2021   Procedure: CORONARY/GRAFT ANGIOGRAPHY;  Surgeon: [name], [name], MD;  Location: [place] [facility];  Service: Cardiovascular;  Laterality: N/A;   CYSTOSCOPY/URETEROSCOPY/HOLMIUM LASER/STENT PLACEMENT Bilateral 11/07/2016   Procedure: CYSTOSCOPY/URETEROSCOPY/HOLMIUM LASER/STENT PLACEMENT;  Surgeon: Nickie Retort, MD;  Location: [facility];  Service: Urology;  Laterality: Bilateral;   CYSTOSCOPY/URETEROSCOPY/HOLMIUM LASER/STENT PLACEMENT Bilateral 11/23/2016   Procedure: CYSTOSCOPY/BILATERAL URETEROSCOPY/HOLMIUM LASER/STENT EXCHANGE;  Surgeon: Nickie Retort,  MD;  Location: [facility];  Service: Urology;  Laterality: Bilateral;  NEEDS 90 MIN    EXTERNAL FIXATION LEG Left 02/22/2017   Procedure: EXTERNAL FIXATION LEFT ANKLE;  Surgeon: Renette Butters, MD;  Location: [place];  Service: Orthopedics;  Laterality: Left;   HEMORRHOID BANDING  1970s   INGUINAL HERNIA REPAIR Left [place]   "cut me open"   LEFT HEART CATH AND CORS/GRAFTS ANGIOGRAPHY N/A 10/25/2018   Procedure: LEFT HEART CATH AND CORS/GRAFTS ANGIOGRAPHY;  Surgeon: Leonie Man, MD;  Location: [place] [facility];  Service: Cardiovascular;  Laterality: N/A;   LEFT HEART CATH AND CORS/GRAFTS ANGIOGRAPHY N/A 12/03/2021   Procedure: LEFT HEART CATH AND CORS/GRAFTS ANGIOGRAPHY;  Surgeon: [name], [name], MD;  Location: [place] [facility];  Service: Cardiovascular;  Laterality: N/A;   POSTERIOR FUSION CERVICAL SPINE  2006   TEE WITHOUT CARDIOVERSION N/A 03/30/2015   Procedure: TRANSESOPHAGEAL ECHOCARDIOGRAM (TEE);  Surgeon: Grace Isaac, MD;  Location: [place];  Service: Open Heart Surgery;  Laterality: N/A;   TESTICLE TORSION REDUCTION Right 1960   TOE FUSION Left 1985 & 2004   "great toe" and removal   TONSILLECTOMY     TOTAL HIP ARTHROPLASTY Right 10/27/2015   Procedure: RIGHT TOTAL HIP ARTHROPLASTY ANTERIOR APPROACH and steroid injection right foot;  Surgeon: Mcarthur Rossetti, MD;  Location: [place];  Service: Orthopedics;  Laterality: Right;   TOTAL HIP ARTHROPLASTY Left 08/13/2019   TOTAL HIP ARTHROPLASTY Left 08/13/2019   Procedure: LEFT TOTAL HIP ARTHROPLASTY ANTERIOR APPROACH;  Surgeon: Mcarthur Rossetti, MD;  Location: [place];  Service: Orthopedics;  Laterality: Left;    Allergies  Allergies  Allergen Reactions   Acyclovir And Related Other (See Comments)    Accumulates and causes stroke-like side-effects due to cytochrome P450 enzyme deficiency   Benzodiazepines Other (See Comments)    Patient states he gets stroke symptoms with benzo's.     Compazine [Prochlorperazine Maleate] Other (See Comments)    Accumulates and causes stroke-like side-effects due to cytochrome P450 enzyme deficiency   Coreg [Carvedilol] Other (See Comments)    Accumulates and causes stroke-like side-effects due to cytochrome P450 enzyme deficiency Patient poor metabolizer of CYP2D6 - Coreg undergoes extensive hepatic (including 2D6)   Flomax [Tamsulosin Hcl] Other (See Comments)    Accumulates and causes stroke-like side-effects due to cytochrome P450 enzyme deficiency   Lisinopril Other (See Comments)    Hyperkalemia. 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   Mobic [Meloxicam] Other (See Comments)    Accumulates and causes stroke-like side-effects due to cytochrome P450 enzyme deficiency   Morphine And Related Other (See Comments)    Per pt's wife morphine also contraindicated due to p450 enzyme deficiency.     Ondansetron Other (See Comments)    Accumulates and causes stroke-like side-effects due to cytochrome P450  enzyme deficiency    Oxycodone Other (See Comments)    Accumulates and causes stroke-like side-effects due to cytochrome P450 enzyme deficiency   Promethazine Other (See Comments)    Accumulates and causes stroke-like side-effects due to cytochrome P450 enzyme deficiency   Sertraline Hcl Other (See Comments)    Accumulates and causes stroke-like side-effects due to cytochrome P450 enzyme deficiency   Tramadol Other (See Comments)    Accumulates and causes stroke-like side-effects due to cytochrome P450 enzyme deficiency   Atorvastatin Other (See Comments)    MYALGIAS   Colchicine Other (See Comments)    ataxia   Fentanyl Other (See Comments)    Accumulates and cause stroke like symptoms    History of Present Illness    81 year old male with the above past medial history including CAD s/p CABG 2016, AVR in 2016, hypertension, hyperlipidemia, OSA, carotid artery stenosis, PTSD, and  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  He was hospitalized in October 2016 in the setting of NSTEMI.  Cardiac catheterization revealed multivessel CAD, significant aortic stenosis.  He underwent CABG with LIMA-LAD, SVG-RI, SVG-LCx, SVG-distal RCA and bioprosthetic AVR.  He subsequently developed atrial fibrillation and was started on amiodarone and metoprolol, however,  amiodarone was subsequently discontinued.  He did not tolerate beta-blocker therapy secondary to bradycardia.  Myoview in 2018 was normal.  Cardiac catheterization in May 2020 the setting of increased dyspnea on exertion showed patent grafts.  Echocardiogram at that time with evidence of diastolic dysfunction, stable AV prosthesis function, pulmonary hypertension. His diuretic dose was increased.  He has not been placed on ACE/ARB/MRA in the past due to high potassium.  He was last seen in the office on 11/18/2021 and reported some stable angina.  He was advised to  notify office of worsening symptoms. Unfortunately, he presented to the ED on 12/02/2021 with chest pain that radiated to his back, unrelieved by nitroglycerin.  He was hospitalized from 12/02/2021 to 12/04/2021. Troponin was negative.  He underwent repeat cardiac catheterization which revealed patent bypass grafts, medical therapy was recommended and isosorbide was added to his medication regimen.  Echocardiogram showed EF 55 to 60%, normal LV function, mild LVH, mildly reduced RV function, mild RV enlargement, moderate dilation of the left atrium, mild lesion of the right atrium, mild mitral valve regurgitation, stable bioprosthetic aortic valve with mild aortic stenosis. Triglycerides were mildly elevated, outpatient follow-up was recommended with consideration of addition of Vascepa if necessary.  He was discharged home in stable condition on 12/04/2021.  He presents today for follow-up.  Since his last visit and since his hospitalization been stable  from a cardiac standpoint.  He denies any recent chest pain, denies dyspnea, palpitations.  Overall, he reports feeling well and denies any new concerns today.  Home Medications    Current Outpatient Medications  Medication Sig Dispense Refill   acetaminophen (TYLENOL) 500 MG tablet Take 1,000 mg by mouth every 6 (six) hours as needed for moderate pain.     allopurinol (ZYLOPRIM) 300 MG tablet TAKE 1 TABLET BY MOUTH EVERY DAY (Patient taking differently: Take 300 mg by mouth daily.) 90 tablet 3   amLODipine (NORVASC) 10 MG tablet TAKE 1 TABLET BY MOUTH EVERY DAY (Patient taking differently: Take 10 mg by mouth every evening.) 90 tablet 3   Ascorbic Acid (VITAMIN C) 1000 MG tablet Take 1,000 mg by mouth daily.     aspirin EC 81 MG tablet Take 81 mg by mouth every other day. Swallow whole.  calcium carbonate (TUMS EX) 750 MG chewable tablet Chew 1 tablet by mouth daily as needed for heartburn.      Carboxymethylcellulose Sodium (LUBRICANT EYE DROPS OP) Place 1 drop into both eyes 2 (two) times daily as needed (dry/irritated eyes.).      Cholecalciferol (VITAMIN D-3) 25 MCG (1000 UT) CAPS Take 1,000 Units by mouth every other day.     famotidine (PEPCID) 40 MG tablet TAKE 1 TABLET BY MOUTH EVERY DAY (Patient taking differently: Take 40 mg by mouth daily.) 90 tablet 2   furosemide (LASIX) 40 MG tablet TAKE 1 TABLET BY MOUTH EVERY OTHER DAY (Patient taking differently: Take 40 mg by mouth every other day.) 45 tablet 2   gabapentin (NEURONTIN) 300 MG capsule Take 1 capsule (300 mg total) by mouth 2 (two) times daily. 180 capsule 2   isosorbide mononitrate (IMDUR) 30 MG 24 hr tablet Take 1 tablet (30 mg total) by mouth daily. 30 tablet 11   levothyroxine (SYNTHROID) 25 MCG tablet TAKE 1 TABLET BY MOUTH EVERY DAY WITH BREAKFAST (Patient taking differently: Take 25 mcg by mouth daily before breakfast.) 90 tablet 3   nitroGLYCERIN (NITROSTAT) 0.4 MG SL tablet PLACE 1 TABLET UNDER THE TONGUE EVERY 5  MINUTES AS NEEDED FOR CHEST PAIN. (Patient taking differently: Place 0.4 mg under the tongue every 5 (five) minutes as needed for chest pain.) 25 tablet 3   primidone (MYSOLINE) 50 MG tablet Take 1 tablet (50 mg total) by mouth 2 (two) times daily. 180 tablet 2   rosuvastatin (CRESTOR) 10 MG tablet TAKE 1/2 TABLET BY MOUTH EVERY DAY (Patient taking differently: Take 5 mg by mouth every evening.) 45 tablet 3   tiZANidine (ZANAFLEX) 4 MG tablet Take 2 mg by mouth every 8 (eight) hours as needed for muscle spasms.     Turmeric 500 MG CAPS Take 500 mg by mouth daily.      vitamin B-12 (CYANOCOBALAMIN) 1000 MCG tablet Take 1,000 mcg by mouth every other day.     No current facility-administered medications for this visit.     Review of Systems    He denies chest pain, palpitations, dyspnea, pnd, orthopnea, n, v, dizziness, syncope, edema, weight gain, or early satiety. All other systems reviewed and are otherwise negative except as noted above.   Physical Exam    VS:  BP 130/72   Pulse 62   Ht '5\' 8"'$  (1.727 m)   Wt 209 lb 9.6 oz (95.1 kg)   SpO2 98%   BMI 31.87 kg/m  GEN: Well nourished, well developed, in no acute distress. HEENT: normal. Neck: Supple, no JVD, L carotid bruit, no masses. Cardiac: RRR, 2/6 murmur, no rubs, or gallops. No clubbing, cyanosis, edema.  Radials/DP/PT 2+ and equal bilaterally. L groin site with bruising, no bleeding or hematoma.  Respiratory:  Respirations regular and unlabored, clear to auscultation bilaterally. GI: Soft, nontender, nondistended, BS + x 4. MS: no deformity or atrophy. Skin: warm and dry, no rash. Neuro:  Strength and sensation are intact. Psych: Normal affect.  Accessory Clinical Findings    ECG personally reviewed by me today -sinus rhythm, 62 bpm, first-degree AV block- no acute changes.  Lab Results  Component Value Date   WBC 7.8 12/04/2021   HGB 15.2 12/04/2021   HCT 45.0 12/04/2021   MCV 93.0 12/04/2021   PLT 140 (L) 12/04/2021    Lab Results  Component Value Date   CREATININE 1.17 12/04/2021   BUN 19 12/04/2021   NA 140  12/04/2021   K 4.2 12/04/2021   CL 107 12/04/2021   CO2 22 12/04/2021   Lab Results  Component Value Date   ALT 29 06/09/2021   AST 23 06/09/2021   ALKPHOS 55 06/09/2021   BILITOT 1.2 06/09/2021   Lab Results  Component Value Date   CHOL 118 12/03/2021   HDL 35 (L) 12/03/2021   LDLCALC 48 12/03/2021   LDLDIRECT 73.0 06/09/2021   TRIG 177 (H) 12/03/2021   CHOLHDL 3.4 12/03/2021    Lab Results  Component Value Date   HGBA1C 6.6 (H) 06/09/2021    Assessment & Plan    1. CAD: S/p CABG x5 in 2016.  Most recent cath revealed patent bypass grafts, medical therapy was recommended and isosorbide was added to his medication regimen. Stable with no anginal symptoms.  Continue aspirin, amlodipine, Imdur, furosemide, and Crestor.  2. Aortic stenosis: S/p AVR in 2016. Echo in 11/2021 showed EF 55 to 60%, normal LV function, mild LVH, mildly reduced RV function, mild RV enlargement, moderate dilation of the left atrium, mild lesion of the right atrium, mild mitral valve regurgitation, stable bioprosthetic aortic valve with mild aortic stenosis. Continue SBE prophylaxis.    3. Hypertension: BP above goal-improved with recheck.  States he has "white coat syndrome." Continue to monitor BP at home and report BP consistently > 130/80.  For now, antihypertensive regimen.  Of note, he does have a history of elevated potassium with lisinopril.  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   4. Hyperlipidemia: LDL was 48 in 11/2021.  Continue aspirin, Crestor.  5. OSA: On CPAP. No concerns.   6. Carotid artery stenosis: He has a history of left carotid bruit.  Carotid Dopplers in 2021 showed 1 to 39% B ICA stenosis.  Asymptomatic.  No indication for repeat study  at this time.  7. Disposition: Follow-up as scheduled with Dr. [name] in 05/2022.   Lenna Sciara, NP 12/20/2021, 12:42 PM

## 2021-12-21 ENCOUNTER — Encounter: Payer: Self-pay | Admitting: Internal Medicine

## 2021-12-21 ENCOUNTER — Ambulatory Visit (INDEPENDENT_AMBULATORY_CARE_PROVIDER_SITE_OTHER): Payer: Medicare Other | Admitting: Internal Medicine

## 2021-12-21 VITALS — BP 140/70 | HR 54 | Temp 99.0°F | Ht 68.0 in | Wt 212.0 lb

## 2021-12-21 DIAGNOSIS — E039 Hypothyroidism, unspecified: Secondary | ICD-10-CM

## 2021-12-21 DIAGNOSIS — E785 Hyperlipidemia, unspecified: Secondary | ICD-10-CM | POA: Diagnosis not present

## 2021-12-21 DIAGNOSIS — E559 Vitamin D deficiency, unspecified: Secondary | ICD-10-CM

## 2021-12-21 DIAGNOSIS — E119 Type 2 diabetes mellitus without complications: Secondary | ICD-10-CM

## 2021-12-21 LAB — POCT GLYCOSYLATED HEMOGLOBIN (HGB A1C): Hemoglobin A1C: 6.1 % — AB (ref 4.0–5.6)

## 2021-12-21 NOTE — Progress Notes (Signed)
Patient ID: Troy Banks, male   DOB: Apr 25, 1941, 81 y.o.   MRN: [id number]        Chief Complaint: follow up dm, htn, low vit d, low thyroid, hld       HPI:  Troy Banks is a 81 y.o. male  Here with wife, Has issue with cyp 450 and wife is asking for letter to the [place] to state it is believed that the agent orange may caused this.  Otherwise she wants referral to a pharmacogeneticist.  No one in the family has the issue. I have declines to do this today.  Pt denies chest pain, increased sob or doe, wheezing, orthopnea, PND, increased LE swelling, palpitations, dizziness or syncope.   Pt denies polydipsia, polyuria, or new focal neuro s/s.    Pt denies fever, wt loss, night sweats, loss of appetite, or other constitutional symptoms  Denies hyper or hypo thyroid symptoms such as voice, skin or hair change.  Not taking Vit D  Wt Readings from Last 3 Encounters:  12/21/21 212 lb (96.2 kg)  12/20/21 209 lb 9.6 oz (95.1 kg)  12/04/21 205 lb 4.8 oz (93.1 kg)   BP Readings from Last 3 Encounters:  12/21/21 140/70  12/20/21 130/72  12/04/21 (!) 161/62         Past Medical History:  Diagnosis Date   Allergy    Anxiety    PTSD   Aortic stenosis, moderate 03/27/2015   post AVR echo Echo 11/16: Mild LVH, EF 55-60%, normal wall motion, grade 1 diastolic dysfunction, bioprosthetic AVR okay (mean gradient 18 mmHg) with trivial AI, mild LAE, atrial septal aneurysm, small effusion    Arthritis    "qwhere" (10/29/2015)   Cancer ([place])    skin cancer   Carotid artery stenosis    Carotid US 11/17: 1-39% bilateral ICA stenosis. F/u prn   Cervical stenosis of spinal canal    fusion 2006   CHF (congestive heart failure) ([place])    "secondry to OHS"   Closed left pilon fracture    COLONIC POLYPS, HX OF    Complication of anesthesia    "takes a lot to put him under and has woken up during surgery" Difficult to wake up . PTSD; "does not metabolize RX well"that he gets "violent", per pt.     Coronary artery disease    DIVERTICULOSIS, COLON    Dysrhythmia    2 bouts of afib post op and at home but corrected with medication.   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 ([place])    confirmed heterozygous 10/24/14 labs   PTSD (post-traumatic stress disorder)    PTSD (post-traumatic stress disorder)    Sleep apnea    On a CPAP   Past Surgical History:  Procedure Laterality Date  ANKLE FUSION Left 03/17/2017   Procedure: FUSION  PILON FRACTURE WITH BONE GRAFTING;  Surgeon: Shona Needles, MD;  Location: [place];  Service: Orthopedics;  Laterality: Left;   ANTERIOR FUSION CERVICAL SPINE  2002   AORTIC VALVE REPLACEMENT N/A 03/30/2015   Procedure: AORTIC VALVE REPLACEMENT (AVR);  Surgeon: Grace Isaac, MD;  Location: [place];  Service: Open Heart Surgery;  Laterality: N/A;   CARDIAC CATHETERIZATION N/A 03/26/2015   Procedure: Left Heart Cath and Coronary Angiography;  Surgeon: Leonie Man, MD;  Location: [place] [facility];  Service: Cardiovascular;  Laterality: N/A;   CARDIAC VALVE REPLACEMENT     COLONOSCOPY W/ POLYPECTOMY     CORONARY ARTERY BYPASS GRAFT N/A 03/30/2015   Procedure: CORONARY ARTERY BYPASS GRAFTING (CABG) x four, using left internal mammary artery and left    leg greater saphenous vein harvested endoscopically ;  Surgeon: Grace Isaac, MD;  Location: [place];  Service: Open Heart Surgery;  Laterality: N/A;    CORONARY/GRAFT ANGIOGRAPHY N/A 12/03/2021   Procedure: CORONARY/GRAFT ANGIOGRAPHY;  Surgeon: [name], [name], MD;  Location: [place] [facility];  Service: Cardiovascular;  Laterality: N/A;   CYSTOSCOPY/URETEROSCOPY/HOLMIUM LASER/STENT PLACEMENT Bilateral 11/07/2016   Procedure: CYSTOSCOPY/URETEROSCOPY/HOLMIUM LASER/STENT PLACEMENT;  Surgeon: Nickie Retort, MD;  Location[facility];  Service: Urology;  Laterality: Bilateral;   CYSTOSCOPY/URETEROSCOPY/HOLMIUM LASER/STENT PLACEMENT Bilateral 11/23/2016   Procedure: CYSTOSCOPY/BILATERAL URETEROSCOPY/HOLMIUM LASER/STENT EXCHANGE;  Surgeon: Nickie Retort, MD;  Location[facility];  Service: Urology;  Laterality: Bilateral;  NEEDS 90 MIN    EXTERNAL FIXATION LEG Left 02/22/2017   Procedure: EXTERNAL FIXATION LEFT ANKLE;  Surgeon: Renette Butters, MD;  Location: [place];  Service: Orthopedics;  Laterality: Left;   HEMORRHOID BANDING  1970s   INGUINAL HERNIA REPAIR Left [place]   "cut me open"   LEFT HEART CATH AND CORS/GRAFTS ANGIOGRAPHY N/A 10/25/2018   Procedure: LEFT HEART CATH AND CORS/GRAFTS ANGIOGRAPHY;  Surgeon: Leonie Man, MD;  Location: [place] [facility];  Service: Cardiovascular;  Laterality: N/A;   LEFT HEART CATH AND CORS/GRAFTS ANGIOGRAPHY N/A 12/03/2021   Procedure: LEFT HEART CATH AND CORS/GRAFTS ANGIOGRAPHY;  Surgeon: [name], [name], MD;  Location: [place] [facility];  Service: Cardiovascular;  Laterality: N/A;   POSTERIOR FUSION CERVICAL SPINE  2006   TEE WITHOUT CARDIOVERSION N/A 03/30/2015   Procedure: TRANSESOPHAGEAL ECHOCARDIOGRAM (TEE);  Surgeon: Grace Isaac, MD;  Location: [place];  Service: Open Heart Surgery;  Laterality: N/A;   TESTICLE TORSION REDUCTION Right 1960   TOE FUSION Left 1985 & 2004   "great toe" and removal   TONSILLECTOMY     TOTAL HIP ARTHROPLASTY Right 10/27/2015   Procedure: RIGHT TOTAL HIP ARTHROPLASTY ANTERIOR APPROACH and steroid injection right foot;  Surgeon:  Mcarthur Rossetti, MD;  Location: [place];  Service: Orthopedics;  Laterality: Right;   TOTAL HIP ARTHROPLASTY Left 08/13/2019   TOTAL HIP ARTHROPLASTY Left 08/13/2019   Procedure: LEFT TOTAL HIP ARTHROPLASTY ANTERIOR APPROACH;  Surgeon: Mcarthur Rossetti, MD;  Location: [place];  Service: Orthopedics;  Laterality: Left;    reports that he has been smoking cigarettes. He has a 34.00 pack-year smoking history. He has never used smokeless tobacco. He reports current alcohol use of about 3.0 standard drinks of alcohol per week. He reports that he does not use drugs. family history includes Alcohol abuse in an other family member; Arthritis in an other family member; Diabetes in his mother  and paternal grandfather; Drug abuse in his sister; Healthy in his brother, daughter, and son; Heart disease in his father, maternal grandfather, maternal grandmother, mother, and paternal grandfather; Hypertension in his father, maternal grandfather, maternal grandmother, mother, and paternal grandfather; Prostate cancer in his maternal uncle; Tremor in his sister. Allergies  Allergen Reactions   Acyclovir And Related Other (See Comments)    Accumulates and causes stroke-like side-effects due to cytochrome P450 enzyme deficiency   Benzodiazepines Other (See Comments)    Patient states he gets stroke symptoms with benzo's.    Compazine [Prochlorperazine Maleate] Other (See Comments)    Accumulates and causes stroke-like side-effects due to cytochrome P450 enzyme deficiency   Coreg [Carvedilol] Other (See Comments)    Accumulates and causes stroke-like side-effects due to cytochrome P450 enzyme deficiency Patient poor metabolizer of CYP2D6 - Coreg undergoes extensive hepatic (including 2D6)   Flomax [Tamsulosin Hcl] Other (See Comments)    Accumulates and causes stroke-like side-effects due to cytochrome P450 enzyme deficiency   Lisinopril Other (See Comments)    Hyperkalemia. Accumulates and causes  stroke-like side-effects due to cytochrome P450 enzyme deficiency.   Mobic [Meloxicam] Other (See Comments)    Accumulates and causes stroke-like side-effects due to cytochrome P450 enzyme deficiency   Morphine And Related Other (See Comments)    Per pt's wife morphine also contraindicated due to p450 enzyme deficiency.     Ondansetron Other (See Comments)    Accumulates and causes stroke-like side-effects due to cytochrome P450 enzyme deficiency    Oxycodone Other (See Comments)    Accumulates and causes stroke-like side-effects due to cytochrome P450 enzyme deficiency   Promethazine Other (See Comments)    Accumulates and causes stroke-like side-effects due to cytochrome P450 enzyme deficiency   Sertraline Hcl Other (See Comments)    Accumulates and causes stroke-like side-effects due to cytochrome P450 enzyme deficiency   Tramadol Other (See Comments)    Accumulates and causes stroke-like side-effects due to cytochrome P450 enzyme deficiency   Atorvastatin Other (See Comments)    MYALGIAS   Colchicine Other (See Comments)    ataxia   Fentanyl Other (See Comments)    Accumulates and cause stroke like symptoms   Current Outpatient Medications on File Prior to Visit  Medication Sig Dispense Refill   acetaminophen (TYLENOL) 500 MG tablet Take 1,000 mg by mouth every 6 (six) hours as needed for moderate pain.     allopurinol (ZYLOPRIM) 300 MG tablet TAKE 1 TABLET BY MOUTH EVERY DAY (Patient taking differently: Take 300 mg by mouth daily.) 90 tablet 3   amLODipine (NORVASC) 10 MG tablet TAKE 1 TABLET BY MOUTH EVERY DAY (Patient taking differently: Take 10 mg by mouth every evening.) 90 tablet 3   Ascorbic Acid (VITAMIN C) 1000 MG tablet Take 1,000 mg by mouth daily.     aspirin EC 81 MG tablet Take 81 mg by mouth every other day. Swallow whole.     calcium carbonate (TUMS EX) 750 MG chewable tablet Chew 1 tablet by mouth daily as needed for heartburn.      Carboxymethylcellulose Sodium  (LUBRICANT EYE DROPS OP) Place 1 drop into both eyes 2 (two) times daily as needed (dry/irritated eyes.).      Cholecalciferol (VITAMIN D-3) 25 MCG (1000 UT) CAPS Take 1,000 Units by mouth every other day.     famotidine (PEPCID) 40 MG tablet TAKE 1 TABLET BY MOUTH EVERY DAY (Patient taking differently: Take 40 mg by mouth daily.) 90 tablet 2   furosemide (  LASIX) 40 MG tablet TAKE 1 TABLET BY MOUTH EVERY OTHER DAY (Patient taking differently: Take 40 mg by mouth every other day.) 45 tablet 2   gabapentin (NEURONTIN) 300 MG capsule Take 1 capsule (300 mg total) by mouth 2 (two) times daily. 180 capsule 2   isosorbide mononitrate (IMDUR) 30 MG 24 hr tablet Take 1 tablet (30 mg total) by mouth daily. 30 tablet 11   levothyroxine (SYNTHROID) 25 MCG tablet TAKE 1 TABLET BY MOUTH EVERY DAY WITH BREAKFAST (Patient taking differently: Take 25 mcg by mouth daily before breakfast.) 90 tablet 3   nitroGLYCERIN (NITROSTAT) 0.4 MG SL tablet PLACE 1 TABLET UNDER THE TONGUE EVERY 5 MINUTES AS NEEDED FOR CHEST PAIN. (Patient taking differently: Place 0.4 mg under the tongue every 5 (five) minutes as needed for chest pain.) 25 tablet 3   primidone (MYSOLINE) 50 MG tablet Take 1 tablet (50 mg total) by mouth 2 (two) times daily. 180 tablet 2   rosuvastatin (CRESTOR) 10 MG tablet TAKE 1/2 TABLET BY MOUTH EVERY DAY (Patient taking differently: Take 5 mg by mouth every evening.) 45 tablet 3   tiZANidine (ZANAFLEX) 4 MG tablet Take 2 mg by mouth every 8 (eight) hours as needed for muscle spasms.     Turmeric 500 MG CAPS Take 500 mg by mouth daily.      vitamin B-12 (CYANOCOBALAMIN) 1000 MCG tablet Take 1,000 mcg by mouth every other day.     [DISCONTINUED] ranitidine (ZANTAC) 150 MG tablet TAKE 1 TABLET BY MOUTH 2 TIMES DAILY (Patient taking differently: No sig reported) 180 tablet 0   No current facility-administered medications on file prior to visit.        ROS:  All others reviewed and negative.  Objective         PE:  BP 140/70 (BP Location: Right Arm, Patient Position: Sitting, Cuff Size: Large)   Pulse (!) 54   Temp 99 F (37.2 C) (Oral)   Ht '5\' 8"'$  (1.727 m)   Wt 212 lb (96.2 kg)   SpO2 94%   BMI 32.23 kg/m                 Constitutional: Pt appears in NAD               HENT: Head: NCAT.                Right Ear: External ear normal.                 Left Ear: External ear normal.                Eyes: . Pupils are equal, round, and reactive to light. Conjunctivae and EOM are normal               Nose: without d/c or deformity               Neck: Neck supple. Gross normal ROM               Cardiovascular: Normal rate and regular rhythm.                 Pulmonary/Chest: Effort normal and breath sounds without rales or wheezing.                Abd:  Soft, NT, ND, + BS, no organomegaly               Neurological: Pt is alert. At baseline orientation, motor grossly intact  Skin: Skin is warm. No rashes, no other new lesions, LE edema - none               Psychiatric: Pt behavior is normal without agitation   Micro: none  Cardiac tracings I have personally interpreted today:  none  Pertinent Radiological findings (summarize): none   Lab Results  Component Value Date   WBC 7.8 12/04/2021   HGB 15.2 12/04/2021   HCT 45.0 12/04/2021   PLT 140 (L) 12/04/2021   GLUCOSE 112 (H) 12/04/2021   CHOL 118 12/03/2021   TRIG 177 (H) 12/03/2021   HDL 35 (L) 12/03/2021   LDLDIRECT 73.0 06/09/2021   LDLCALC 48 12/03/2021   ALT 29 06/09/2021   AST 23 06/09/2021   NA 140 12/04/2021   K 4.2 12/04/2021   CL 107 12/04/2021   CREATININE 1.17 12/04/2021   BUN 19 12/04/2021   CO2 22 12/04/2021   TSH 6.138 (H) 12/04/2021   PSA 1.49 06/10/2020   INR 1.1 12/02/2021   HGBA1C 6.1 (A) 12/21/2021   MICROALBUR 8.8 (H) 06/10/2020   Hemoglobin A1C 4.0 - 5.6 % 6.1 Abnormal   6.6 High  R, CM  6.0 R  Troy Banks is a 81 y.o. White or Caucasian [1] male with  has a past medical history of  Allergy, Anxiety, Aortic stenosis, moderate (03/27/2015), Arthritis, Cancer ([place]), Carotid artery stenosis, Cervical stenosis of spinal canal, CHF (congestive heart failure) ([place]), Closed left pilon fracture, COLONIC POLYPS, HX OF, Complication of anesthesia, Coronary artery disease, DIVERTICULOSIS, COLON, Dysrhythmia, Familial tremor (03/19/2014), Family history of adverse reaction to anesthesia, GERD (gastroesophageal reflux disease), GOUT, Headache, Heart murmur, High cholesterol, History of blood transfusion, History of kidney stones, HYPERTENSION, Hypothyroidism, Myocardial infarction ([place]), OSA on CPAP, Pneumonia, 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  Vitamin D deficiency Last vitamin D Lab Results  Component Value Date   VD25OH 33.23 06/09/2021   Low, to start oral replacement   Hypothyroidism Lab Results  Component Value Date   TSH 6.138 (H) 12/04/2021   Slight uncontrolled, pt to continue levothyroxine 25 mcg qd, declines change for now  Dyslipidemia Lab Results  Component Value Date   LDLCALC 48 12/03/2021   Stable, pt to continue current statin crestor 10 qd   Diet-controlled diabetes mellitus ([place]) Lab Results  Component Value Date   HGBA1C 6.1 (A) 12/21/2021   Stable, pt to continue current medical treatment  - diet, wt control  Followup: Return in about 6 months (around 06/23/2022).  Cathlean Cower, MD 12/25/2021 2:12 PM [facility]

## 2021-12-21 NOTE — Patient Instructions (Addendum)
Your A1c was done today  Please consider having the Shingles shot at the local pharmacy  Please continue all other medications as before, and refills have been done if requested.  Please have the pharmacy call with any other refills you may need.  Please continue your efforts at being more active, low cholesterol diet, and weight control.  Please keep your appointments with your specialists as you may have planned  Please make an Appointment to return in 6 months, or sooner if needed

## 2021-12-25 ENCOUNTER — Encounter: Payer: Self-pay | Admitting: Internal Medicine

## 2021-12-25 NOTE — Assessment & Plan Note (Signed)
Last vitamin D Lab Results  Component Value Date   VD25OH 33.23 06/09/2021   Low, to start oral replacement

## 2021-12-25 NOTE — Assessment & Plan Note (Signed)
Lab Results  Component Value Date   TSH 6.138 (H) 12/04/2021   Slight uncontrolled, pt to continue levothyroxine 25 mcg qd, declines change for now

## 2021-12-25 NOTE — Assessment & Plan Note (Signed)
Lab Results  Component Value Date   HGBA1C 6.1 (A) 12/21/2021   Stable, pt to continue current medical treatment  - diet, wt control

## 2021-12-25 NOTE — Assessment & Plan Note (Signed)
Lab Results  Component Value Date   LDLCALC 48 12/03/2021   Stable, pt to continue current statin crestor 10 qd

## 2022-01-12 ENCOUNTER — Ambulatory Visit (INDEPENDENT_AMBULATORY_CARE_PROVIDER_SITE_OTHER): Payer: Medicare Other

## 2022-01-12 ENCOUNTER — Ambulatory Visit (INDEPENDENT_AMBULATORY_CARE_PROVIDER_SITE_OTHER): Payer: Medicare Other | Admitting: Orthopaedic Surgery

## 2022-01-12 DIAGNOSIS — G8929 Other chronic pain: Secondary | ICD-10-CM

## 2022-01-12 DIAGNOSIS — M25511 Pain in right shoulder: Secondary | ICD-10-CM | POA: Diagnosis not present

## 2022-01-12 DIAGNOSIS — M629 Disorder of muscle, unspecified: Secondary | ICD-10-CM | POA: Diagnosis not present

## 2022-01-12 DIAGNOSIS — M25512 Pain in left shoulder: Secondary | ICD-10-CM

## 2022-01-12 MED ORDER — TRIAMCINOLONE ACETONIDE 40 MG/ML IJ SUSP
80.0000 mg | INTRAMUSCULAR | Status: AC | PRN
  Administered 2022-01-12: 80 mg via INTRA_ARTICULAR

## 2022-01-12 MED ORDER — LIDOCAINE HCL 1 % IJ SOLN
4.0000 mL | INTRAMUSCULAR | Status: AC | PRN
  Administered 2022-01-12: 4 mL

## 2022-01-12 NOTE — Progress Notes (Signed)
Chief Complaint: Left shoulder pain     History of Present Illness:    Troy Banks is a 81 y.o. male ambidextrous male presents with left shoulder pain which has been ongoing now for several years.  He does have a history of cervical ACDF with Dr. Lavetta Nielsen in 2004 and 2005.  He has been seen by Dr. Ninfa Linden who performed a subacromial injection in April of this month without any relief.  Here today for further assessment.  He states that the pain is predominantly in the posterior trapezius area.  He has no pain with laying directly on the side.  No radiation towards the deltoid or down the arm.    Surgical History:   None  PMH/PSH/Family History/Social History/Meds/Allergies:    Past Medical History:  Diagnosis Date  . Allergy   . Anxiety    PTSD  . Aortic stenosis, moderate 03/27/2015   post AVR echo Echo 11/16: Mild LVH, EF 55-60%, normal wall motion, grade 1 diastolic dysfunction, bioprosthetic AVR okay (mean gradient 18 mmHg) with trivial AI, mild LAE, atrial septal aneurysm, small effusion   . Arthritis    "qwhere" (10/29/2015)  . Cancer ([place])    skin cancer  . Carotid artery stenosis    Carotid US 11/17: 1-39% bilateral ICA stenosis. F/u prn  . Cervical stenosis of spinal canal    fusion 2006  . CHF (congestive heart failure) ([place])    "secondry to OHS"  . Closed left pilon fracture   . COLONIC POLYPS, HX OF   . Complication of anesthesia    "takes a lot to put him under and has woken up during surgery" Difficult to wake up . PTSD; "does not metabolize RX well"that he gets "violent", per pt.   . Coronary artery disease   . DIVERTICULOSIS, COLON   . Dysrhythmia    2 bouts of afib post op and at home but corrected with medication.  . Familial tremor 03/19/2014  . Family history of adverse reaction to anesthesia    Father - hold to get asleep  . GERD (gastroesophageal reflux disease)    uses Zantac  . GOUT   . Headache   . Heart  murmur    had aortic value replacement  . High cholesterol   . History of blood transfusion   . History of kidney stones    surgery  . HYPERTENSION    off ACEI 2010 because of hyperkalemia in setting of ARI  . Hypothyroidism    newly diaganosed 09/2015  . Myocardial infarction [facility])    October 20th 2016  . OSA on CPAP   . Pneumonia   . 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 PTSD (post-traumatic stress disorder)   . PTSD (post-traumatic stress disorder)   . Sleep apnea    On a CPAP   Past Surgical History:  Procedure Laterality Date  . ANKLE FUSION Left 03/17/2017   Procedure: FUSION  PILON FRACTURE WITH BONE GRAFTING;  Surgeon: Shona Needles, MD;  Location: [place];  Service: Orthopedics;  Laterality: Left;  . ANTERIOR FUSION CERVICAL SPINE  2002  . AORTIC VALVE REPLACEMENT N/A 03/30/2015   Procedure: AORTIC VALVE REPLACEMENT (AVR);  Surgeon: Grace Isaac, MD;  Location: [place];  Service: Open Heart Surgery;  Laterality: N/A;  . CARDIAC CATHETERIZATION N/A 03/26/2015   Procedure: Left Heart Cath and Coronary Angiography;  Surgeon: Leonie Man, MD;  Location: [place] [facility];  Service: Cardiovascular;  Laterality: N/A;  . CARDIAC VALVE REPLACEMENT    . COLONOSCOPY W/ POLYPECTOMY    . CORONARY ARTERY BYPASS GRAFT N/A 03/30/2015   Procedure: CORONARY ARTERY BYPASS GRAFTING (CABG) x four, using left internal mammary artery and left    leg greater saphenous vein harvested endoscopically ;  Surgeon: Grace Isaac, MD;  Location: [place];  Service: Open Heart Surgery;  Laterality: N/A;  . CORONARY/GRAFT ANGIOGRAPHY N/A 12/03/2021   Procedure: CORONARY/GRAFT ANGIOGRAPHY;  Surgeon: [name], [name], MD;  Location: [place] [facility];  Service: Cardiovascular;  Laterality: N/A;  . CYSTOSCOPY/URETEROSCOPY/HOLMIUM LASER/STENT PLACEMENT Bilateral 11/07/2016   Procedure: CYSTOSCOPY/URETEROSCOPY/HOLMIUM LASER/STENT  PLACEMENT;  Surgeon: Nickie Retort, MD;  Location: [facility];  Service: Urology;  Laterality: Bilateral;  . CYSTOSCOPY/URETEROSCOPY/HOLMIUM LASER/STENT PLACEMENT Bilateral 11/23/2016   Procedure: CYSTOSCOPY/BILATERAL URETEROSCOPY/HOLMIUM LASER/STENT EXCHANGE;  Surgeon: Nickie Retort, MD;  Location: [facility];  Service: Urology;  Laterality: Bilateral;  NEEDS 90 MIN   . EXTERNAL FIXATION LEG Left 02/22/2017   Procedure: EXTERNAL FIXATION LEFT ANKLE;  Surgeon: Renette Butters, MD;  Location: [place];  Service: Orthopedics;  Laterality: Left;  . HEMORRHOID BANDING  1970s  . INGUINAL HERNIA REPAIR Left 1962  . [place]   "cut me open"  . LEFT HEART CATH AND CORS/GRAFTS ANGIOGRAPHY N/A 10/25/2018   Procedure: LEFT HEART CATH AND CORS/GRAFTS ANGIOGRAPHY;  Surgeon: Leonie Man, MD;  Location: [place] [facility];  Service: Cardiovascular;  Laterality: N/A;  . LEFT HEART CATH AND CORS/GRAFTS ANGIOGRAPHY N/A 12/03/2021   Procedure: LEFT HEART CATH AND CORS/GRAFTS ANGIOGRAPHY;  Surgeon: [name], [name], MD;  Location: [place] [facility];  Service: Cardiovascular;  Laterality: N/A;  . POSTERIOR FUSION CERVICAL SPINE  2006  . TEE WITHOUT CARDIOVERSION N/A 03/30/2015   Procedure: TRANSESOPHAGEAL ECHOCARDIOGRAM (TEE);  Surgeon: Grace Isaac, MD;  Location: [place];  Service: Open Heart Surgery;  Laterality: N/A;  . TESTICLE TORSION REDUCTION Right 1960  . TOE FUSION Left 1985 & 2004   "great toe" and removal  . TONSILLECTOMY    . TOTAL HIP ARTHROPLASTY Right 10/27/2015   Procedure: RIGHT TOTAL HIP ARTHROPLASTY ANTERIOR APPROACH and steroid injection right foot;  Surgeon: Mcarthur Rossetti, MD;  Location: [place];  Service: Orthopedics;  Laterality: Right;  . TOTAL HIP ARTHROPLASTY Left 08/13/2019  . TOTAL HIP ARTHROPLASTY Left 08/13/2019   Procedure: LEFT TOTAL HIP ARTHROPLASTY ANTERIOR APPROACH;  Surgeon: Mcarthur Rossetti, MD;  Location: [place];  Service: Orthopedics;   Laterality: Left;   Social History   Socioeconomic History  . Marital status: Married    Spouse name: Karmen Bongo, RN  . Number of children: 2  . Years of education: Not on file  . Highest education level: Some college, no degree  Occupational History  . Occupation: Retired Animal nutritionist  Tobacco Use  . Smoking status: Some Days    Packs/day: 1.00    Years: 34.00    Total pack years: 34.00    Types: Cigarettes    Last attempt to quit: 06/06/1990    Years since quitting: 31.6  . Smokeless tobacco: Never  Vaping Use  . Vaping Use: Never used  Substance and Sexual Activity  . Alcohol use: Yes    Alcohol/week:  3.0 standard drinks of alcohol    Types: 1 Cans of beer, 2 Shots of liquor per week    Comment: occassional  . Drug use: No  . Sexual activity: Yes  Other Topics Concern  . Not on file  Social History Narrative   Left Handed    One Story home    Social Determinants of Health   Financial Resource Strain: Low Risk  (08/30/2021)   Overall Financial Resource Strain (CARDIA)   . Difficulty of Paying Living Expenses: Not hard at all  Food Insecurity: No Food Insecurity (08/30/2021)   Hunger Vital Sign   . Worried About Charity fundraiser in the Last Year: Never true   . Ran Out of Food in the Last Year: Never true  Transportation Needs: No Transportation Needs (08/30/2021)   PRAPARE - Transportation   . Lack of Transportation (Medical): No   . Lack of Transportation (Non-Medical): No  Physical Activity: Insufficiently Active (08/30/2021)   Exercise Vital Sign   . Days of Exercise per Week: 3 days   . Minutes of Exercise per Session: 30 min  Stress: No Stress Concern Present (08/30/2021)   [place]   . Feeling of Stress : Not at all  Social Connections: Moderately Integrated (08/30/2021)   Social Connection and Isolation Panel [NHANES]   . Frequency of Communication with Friends and Family:  Three times a week   . Frequency of Social Gatherings with Friends and Family: Three times a week   . Attends Religious Services: Never   . Active Member of Clubs or Organizations: Yes   . Attends Archivist Meetings: 1 to 4 times per year   . Marital Status: Living with partner   Family History  Problem Relation Age of Onset  . Heart disease Mother   . Hypertension Mother   . Diabetes Mother   . Heart disease Father   . Hypertension Father   . Tremor Sister   . Healthy Brother   . Healthy Son   . Prostate cancer Maternal Uncle   . Hypertension Maternal Grandmother   . Heart disease Maternal Grandmother   . Hypertension Maternal Grandfather   . Heart disease Maternal Grandfather   . Hypertension Paternal Grandfather   . Heart disease Paternal Grandfather   . Diabetes Paternal Grandfather   . Alcohol abuse Other   . Arthritis Other   . Drug abuse Sister   . Healthy Daughter   . Colon polyps Neg Hx   . Esophageal cancer Neg Hx   . Rectal cancer Neg Hx   . Stomach cancer Neg Hx   . Colon cancer Neg Hx    Allergies  Allergen Reactions  . Acyclovir And Related Other (See Comments)    Accumulates and causes stroke-like side-effects due to cytochrome P450 enzyme deficiency  . Benzodiazepines Other (See Comments)    Patient states he gets stroke symptoms with benzo's.   . Compazine [Prochlorperazine Maleate] Other (See Comments)    Accumulates and causes stroke-like side-effects due to cytochrome P450 enzyme deficiency  . Coreg [Carvedilol] Other (See Comments)    Accumulates and causes stroke-like side-effects due to cytochrome P450 enzyme deficiency Patient poor metabolizer of CYP2D6 - Coreg undergoes extensive hepatic (including 2D6)  . Flomax [Tamsulosin Hcl] Other (See Comments)    Accumulates and causes stroke-like side-effects due to cytochrome P450 enzyme deficiency  . Lisinopril Other (See Comments)    Hyperkalemia. Accumulates and causes stroke-like  side-effects due to cytochrome P450 enzyme deficiency.  . Mobic [Meloxicam] Other (See Comments)    Accumulates and causes stroke-like side-effects due to cytochrome P450 enzyme deficiency  . Morphine And Related Other (See Comments)    Per pt's wife morphine also contraindicated due to p450 enzyme deficiency.    . Ondansetron Other (See Comments)    Accumulates and causes stroke-like side-effects due to cytochrome P450 enzyme deficiency   . Oxycodone Other (See Comments)    Accumulates and causes stroke-like side-effects due to cytochrome P450 enzyme deficiency  . Promethazine Other (See Comments)    Accumulates and causes stroke-like side-effects due to cytochrome P450 enzyme deficiency  . Sertraline Hcl Other (See Comments)    Accumulates and causes stroke-like side-effects due to cytochrome P450 enzyme deficiency  . Tramadol Other (See Comments)    Accumulates and causes stroke-like side-effects due to cytochrome P450 enzyme deficiency  . Atorvastatin Other (See Comments)    MYALGIAS  . Colchicine Other (See Comments)    ataxia  . Fentanyl Other (See Comments)    Accumulates and cause stroke like symptoms   Current Outpatient Medications  Medication Sig Dispense Refill  . acetaminophen (TYLENOL) 500 MG tablet Take 1,000 mg by mouth every 6 (six) hours as needed for moderate pain.    [name] allopurinol (ZYLOPRIM) 300 MG tablet TAKE 1 TABLET BY MOUTH EVERY DAY (Patient taking differently: Take 300 mg by mouth daily.) 90 tablet 3  . amLODipine (NORVASC) 10 MG tablet TAKE 1 TABLET BY MOUTH EVERY DAY (Patient taking differently: Take 10 mg by mouth every evening.) 90 tablet 3  . Ascorbic Acid (VITAMIN C) 1000 MG tablet Take 1,000 mg by mouth daily.    [name] aspirin EC 81 MG tablet Take 81 mg by mouth every other day. Swallow whole.    . calcium carbonate (TUMS EX) 750 MG chewable tablet Chew 1 tablet by mouth daily as needed for heartburn.     . Carboxymethylcellulose Sodium (LUBRICANT EYE  DROPS OP) Place 1 drop into both eyes 2 (two) times daily as needed (dry/irritated eyes.).     [name] Cholecalciferol (VITAMIN D-3) 25 MCG (1000 UT) CAPS Take 1,000 Units by mouth every other day.    . famotidine (PEPCID) 40 MG tablet TAKE 1 TABLET BY MOUTH EVERY DAY (Patient taking differently: Take 40 mg by mouth daily.) 90 tablet 2  . furosemide (LASIX) 40 MG tablet TAKE 1 TABLET BY MOUTH EVERY OTHER DAY (Patient taking differently: Take 40 mg by mouth every other day.) 45 tablet 2  . gabapentin (NEURONTIN) 300 MG capsule Take 1 capsule (300 mg total) by mouth 2 (two) times daily. 180 capsule 2  . isosorbide mononitrate (IMDUR) 30 MG 24 hr tablet Take 1 tablet (30 mg total) by mouth daily. 30 tablet 11  . levothyroxine (SYNTHROID) 25 MCG tablet TAKE 1 TABLET BY MOUTH EVERY DAY WITH BREAKFAST (Patient taking differently: Take 25 mcg by mouth daily before breakfast.) 90 tablet 3  . nitroGLYCERIN (NITROSTAT) 0.4 MG SL tablet PLACE 1 TABLET UNDER THE TONGUE EVERY 5 MINUTES AS NEEDED FOR CHEST PAIN. (Patient taking differently: Place 0.4 mg under the tongue every 5 (five) minutes as needed for chest pain.) 25 tablet 3  . primidone (MYSOLINE) 50 MG tablet Take 1 tablet (50 mg total) by mouth 2 (two) times daily. 180 tablet 2  . rosuvastatin (CRESTOR) 10 MG tablet TAKE 1/2 TABLET BY MOUTH EVERY DAY (Patient taking differently: Take 5 mg by mouth every evening.) 45 tablet  3  . tiZANidine (ZANAFLEX) 4 MG tablet Take 2 mg by mouth every 8 (eight) hours as needed for muscle spasms.    . Turmeric 500 MG CAPS Take 500 mg by mouth daily.     . vitamin B-12 (CYANOCOBALAMIN) 1000 MCG tablet Take 1,000 mcg by mouth every other day.     No current facility-administered medications for this visit.   No results found.  Review of Systems:   A ROS was performed including pertinent positives and negatives as documented in the HPI.  Physical Exam :   Constitutional: NAD and appears stated age Neurological: Alert and  oriented Psych: Appropriate affect and cooperative There were no vitals taken for this visit.   Comprehensive Musculoskeletal Exam:    Musculoskeletal Exam    Inspection Right Left  Skin No atrophy or winging Significant winging  Palpation    Tenderness None Posterior trapezius  Range of Motion    Flexion (passive) 170 170  Flexion (active) 170 170  Abduction 170 170  ER at the side 70 70  Can reach behind back to T12 T12  Strength     Full Full  Special Tests    Pseudoparalytic No No  Neurologic    Fires PIN, radial, median, ulnar, musculocutaneous, axillary, suprascapular, long thoracic, and spinal accessory innervated muscles. No abnormal sensibility  Vascular/Lymphatic    Radial Pulse 2+ 2+  Cervical Exam    Patient has symmetric cervical range of motion with negative Spurling's test.  Special Test:      Imaging:   Xray (3 views left shoulder, 3 views right shoulder): Mild osteoarthritis right shoulder, normal shoulder on the left  I personally reviewed and interpreted the radiographs.   Assessment:   81 y.o. male with scapular winging in the setting of scapular dyskinesis on the left.  I have advised that this is likely result of his forward posture and previous cervical issues.  That effect he is able to experiencing some subscapular bursitis which I recommended ultrasound-guided injection into the left shoulder.  He would like to proceed with this.  I do believe that physical therapy will also be helpful to perform some dry needling of the neck as he is having significant posterior cervical type symptoms that are resulting in headaches  Plan :    -Plan for physical therapy for dry needling of the trapezius -Left ultrasound-guided subscapular bursal injection performed today     Procedure Note  Patient: Troy Banks             Date of Birth: [birth year]           MRN: [id number]             Visit Date: 01/12/2022  Procedures: Visit Diagnoses:  1.  Chronic right shoulder pain   2. Musculoskeletal disorder involving upper trapezius muscle     Large Joint Inj on 01/12/2022 9:43 AM Indications: pain Details: 22 G 1.5 in needle, ultrasound-guided anterior approach  Arthrogram: No  Medications: 4 mL lidocaine 1 %; 80 mg triamcinolone acetonide 40 MG/ML Outcome: tolerated well, no immediate complications Procedure, treatment alternatives, risks and benefits explained, specific risks discussed. Consent was given by the patient. Immediately prior to procedure a time out was called to verify the correct patient, procedure, equipment, support staff and site/side marked as required. Patient was prepped and draped in the usual sterile fashion.       I personally saw and evaluated the patient, and participated in the management and treatment  plan.  Vanetta Mulders, MD Attending Physician, Orthopedic Surgery  This document was dictated using Dragon voice recognition software. A reasonable attempt at proof reading has been made to minimize errors.

## 2022-01-13 ENCOUNTER — Ambulatory Visit: Payer: Self-pay | Admitting: Licensed Clinical Social Worker

## 2022-01-13 NOTE — Patient Outreach (Signed)
  Care Coordination   Initial Visit Note   01/13/2022 Name: Troy Banks MRN: [id number] DOB: [birth year]  Troy Banks is a 81 y.o. year old male who sees [name], MD for primary care. I spoke with  Sallee Lange [place]/ significant other of client, Troy Banks, by phone today  What matters to the patients health and wellness today?  Client wants to maintain current activity level. Client wants to complete ADLs daily    Goals Addressed             This Visit's Progress    patient wants to maintain current activity level ; client wants to complete daily ADLs       Care Coordination Interventions:   Active listening / Reflection utilized  Discussed Care Coordination program support with Karmen Bongo, significant other of client Reviewed care needs of client with Juliane Lack said client was doing well and she did not think that he needed Care Coordination support services at present      SDOH assessments and interventions completed:  No     Care Coordination Interventions Activated:  No  Care Coordination Interventions:  No, not indicated   Follow up plan: No further intervention required.   Encounter Outcome:  Pt. Visit Completed

## 2022-01-13 NOTE — Patient Instructions (Addendum)
Visit Information  Thank you for taking time to visit with me today. Please don't hesitate to contact me if I can be of assistance to you before our next scheduled telephone appointment.  Following are the goals we discussed today:   No further intervention is needed at present  Please call the care guide team at [phone] if you need to cancel or reschedule your appointment.   If you are experiencing a Mental Health or [place] or need someone to talk to, please go to [facility] [place] [phone])   Following is a copy of your full plan of care:   Care Coordination Interventions:  Active listening / Reflection utilized  Discussed Care Coordination program support with Karmen Bongo, significant other of client Reviewed care needs of client with Juliane Lack said client was doing well and she did not think that he needed Care Coordination support services at present   Mr. Emond was given information about Care Management services by the embedded care coordination team including:  Care Management services include personalized support from designated clinical staff supervised by his physician, including individualized plan of care and coordination with other care providers 24/7 contact phone numbers for assistance for urgent and routine care needs. The patient may stop CCM services at any time (effective at the end of the month) by phone call to the office staff.  Patient agreed to services and verbal consent obtained.   Norva Riffle.Azaleah Usman MSW, [place] representative [facility] Care Management [phone]

## 2022-02-01 ENCOUNTER — Encounter: Payer: Self-pay | Admitting: Neurology

## 2022-02-11 ENCOUNTER — Ambulatory Visit: Payer: Medicare Other | Admitting: Podiatry

## 2022-02-11 ENCOUNTER — Encounter: Payer: Self-pay | Admitting: Podiatry

## 2022-02-11 DIAGNOSIS — B351 Tinea unguium: Secondary | ICD-10-CM

## 2022-02-11 DIAGNOSIS — E119 Type 2 diabetes mellitus without complications: Secondary | ICD-10-CM

## 2022-02-11 DIAGNOSIS — M79675 Pain in left toe(s): Secondary | ICD-10-CM

## 2022-02-11 DIAGNOSIS — M79674 Pain in right toe(s): Secondary | ICD-10-CM

## 2022-02-11 NOTE — Progress Notes (Signed)

## 2022-02-12 ENCOUNTER — Other Ambulatory Visit: Payer: Self-pay | Admitting: Neurology

## 2022-02-12 ENCOUNTER — Other Ambulatory Visit: Payer: Self-pay | Admitting: Internal Medicine

## 2022-02-12 DIAGNOSIS — G43709 Chronic migraine without aura, not intractable, without status migrainosus: Secondary | ICD-10-CM

## 2022-02-12 DIAGNOSIS — G250 Essential tremor: Secondary | ICD-10-CM

## 2022-02-12 NOTE — Telephone Encounter (Signed)
Please refill as per office routine med refill policy (all routine meds to be refilled for 3 mo or monthly (per pt preference) up to one year from last visit, then month to month grace period for 3 mo, then further med refills will have to be denied) ? ?

## 2022-02-25 ENCOUNTER — Ambulatory Visit (INDEPENDENT_AMBULATORY_CARE_PROVIDER_SITE_OTHER): Payer: Medicare Other | Admitting: *Deleted

## 2022-02-25 VITALS — BP 175/89 | HR 62 | Temp 98.5°F | Resp 16 | Ht 66.0 in | Wt 205.4 lb

## 2022-02-25 DIAGNOSIS — G43719 Chronic migraine without aura, intractable, without status migrainosus: Secondary | ICD-10-CM

## 2022-02-25 DIAGNOSIS — G4489 Other headache syndrome: Secondary | ICD-10-CM

## 2022-02-25 MED ORDER — SODIUM CHLORIDE 0.9 % IV SOLN
100.0000 mg | Freq: Once | INTRAVENOUS | Status: AC
  Administered 2022-02-25: 100 mg via INTRAVENOUS
  Filled 2022-02-25: qty 1

## 2022-02-25 NOTE — Progress Notes (Signed)
Diagnosis: Migraine  Provider:  Marshell Garfinkel MD  Procedure: Infusion  IV Type: Peripheral, IV Location: L Forearm  Vyepti (Eptinezumab-jjmr), Dose: 100 mg  Infusion Start Time: 0940 am  Infusion Stop Time: 3017 pm  Post Infusion IV Care: Observation period completed and Peripheral IV Discontinued  Discharge: Condition: Good, Destination: Home . AVS provided to patient.   Performed by:  Oren Beckmann, RN

## 2022-04-04 ENCOUNTER — Other Ambulatory Visit: Payer: Self-pay | Admitting: Internal Medicine

## 2022-04-04 NOTE — Telephone Encounter (Signed)
Please refill as per office routine med refill policy (all routine meds to be refilled for 3 mo or monthly (per pt preference) up to one year from last visit, then month to month grace period for 3 mo, then further med refills will have to be denied) ? ?

## 2022-04-13 ENCOUNTER — Other Ambulatory Visit: Payer: Self-pay | Admitting: Internal Medicine

## 2022-04-13 DIAGNOSIS — R311 Benign essential microscopic hematuria: Secondary | ICD-10-CM | POA: Diagnosis not present

## 2022-04-13 DIAGNOSIS — R1084 Generalized abdominal pain: Secondary | ICD-10-CM | POA: Diagnosis not present

## 2022-04-20 ENCOUNTER — Ambulatory Visit (INDEPENDENT_AMBULATORY_CARE_PROVIDER_SITE_OTHER): Payer: Medicare Other

## 2022-04-20 ENCOUNTER — Ambulatory Visit (INDEPENDENT_AMBULATORY_CARE_PROVIDER_SITE_OTHER): Payer: Medicare Other | Admitting: Emergency Medicine

## 2022-04-20 ENCOUNTER — Encounter: Payer: Self-pay | Admitting: Emergency Medicine

## 2022-04-20 VITALS — BP 150/78 | HR 118 | Temp 98.3°F | Wt 211.1 lb

## 2022-04-20 DIAGNOSIS — R079 Chest pain, unspecified: Secondary | ICD-10-CM | POA: Diagnosis not present

## 2022-04-20 DIAGNOSIS — R0600 Dyspnea, unspecified: Secondary | ICD-10-CM | POA: Diagnosis not present

## 2022-04-20 DIAGNOSIS — M7918 Myalgia, other site: Secondary | ICD-10-CM

## 2022-04-20 DIAGNOSIS — M898X1 Other specified disorders of bone, shoulder: Secondary | ICD-10-CM | POA: Diagnosis not present

## 2022-04-20 NOTE — Patient Instructions (Signed)
Musculoskeletal Pain Musculoskeletal pain refers to aches and pains in your bones, joints, muscles, and the tissues that surround them. This pain can occur in any part of the body. It can last for a short time (acute) or a long time (chronic). A physical exam, lab tests, and imaging studies may be done to find the cause of your musculoskeletal pain. Follow these instructions at home: Lifestyle Try to control or lower your stress levels. Stress increases muscle tension and can worsen musculoskeletal pain. It is important to recognize when you are anxious or stressed and learn ways to manage it. This may include: Meditation or yoga. Cognitive or behavioral therapy. Acupuncture or massage therapy. You may continue all activities unless the activities cause more pain. When the pain gets better, slowly resume your normal activities. Gradually increase the intensity and duration of your activities or exercise. Managing pain, stiffness, and swelling     Treatment may include medicines for pain and inflammation that are taken by mouth or applied to the skin. Take over-the-counter and prescription medicines only as told by your health care provider. When your pain is severe, bed rest may be helpful. Lie or sit in any position that is comfortable, but get out of bed and walk around at least every couple of hours. If directed, apply heat to the affected area as often as told by your health care provider. Use the heat source that your health care provider recommends, such as a moist heat pack or a heating pad. Place a towel between your skin and the heat source. Leave the heat on for 20-30 minutes. Remove the heat if your skin turns bright red. This is especially important if you are unable to feel pain, heat, or cold. You may have a greater risk of getting burned. If directed, put ice on the painful area. To do this: Put ice in a plastic bag. Place a towel between your skin and the bag. Leave the ice on  for 20 minutes, 2-3 times a day. Remove the ice if your skin turns bright red. This is very important. If you cannot feel pain, heat, or cold, you have a greater risk of damage to the area. General instructions Your health care provider may recommend that you see a physical therapist. This person can help you come up with a safe exercise program. If told by your health care provider, do physical therapy exercises to improve movement and strength in the affected area. Keep all follow-up visits. This is important. This includes any physical therapy visits. Contact a health care provider if: Your pain gets worse. Medicines do not help ease your pain. You cannot use the part of your body that hurts, such as your arm, leg, or neck. You have trouble sleeping. You have trouble doing your normal activities. Get help right away if: You have a new injury and your pain is worse or different. You feel numb or you have tingling in the painful area. Summary Musculoskeletal pain refers to aches and pains in your bones, joints, muscles, and the tissues that surround them. This pain can occur in any part of the body. Your health care provider may recommend that you see a physical therapist. This person can help you come up with a safe exercise program. Do any exercises as told by your physical therapist. Lower your stress level. Stress can worsen musculoskeletal pain. Ways to lower stress may include meditation, yoga, cognitive or behavioral therapy, acupuncture, and massage therapy. This information is not intended   to replace advice given to you by your health care provider. Make sure you discuss any questions you have with your health care provider. Document Revised: 09/26/2019 Document Reviewed: 09/04/2019 Elsevier Patient Education  2023 [employer] Inc.  

## 2022-04-20 NOTE — Assessment & Plan Note (Signed)
Musculoskeletal in nature.  Tenderness on palpation. No acute findings on EKG. Chest x-ray ordered today. Tylenol for pain management.

## 2022-04-20 NOTE — Progress Notes (Signed)
Troy Banks 81 y.o.   Chief Complaint  Patient presents with   Follow-up    Back pain that is radiating to his chest. Muscle pain x 2 weeks, dull, throbbing pain, under left shoulder blade     HISTORY OF PRESENT ILLNESS: Acute problem visit today.  Patient of [name]. This is a 81 y.o. male complaining of dull throbbing pain to left scapular area for the past 2 weeks with occasional radiation to the front but no associated symptoms.  Pain worse with movement, better with rest.  Has had similar symptoms to right scapular area in the past. Unable to take most prescription painkillers due to rare chronic enzyme deficiency. Has tried topical treatments, patches, heat pad, cold compresses with little relief. No other complaints or medical concerns today.  HPI   Prior to Admission medications   Medication Sig Start Date End Date Taking? Authorizing Provider  acetaminophen (TYLENOL) 500 MG tablet Take 1,000 mg by mouth every 6 (six) hours as needed for moderate pain. 12/18/17  Yes [provider]  allopurinol (ZYLOPRIM) 300 MG tablet TAKE 1 TABLET BY MOUTH EVERY DAY Patient taking differently: Take 300 mg by mouth daily. 06/14/21  Yes Troy Borg, MD  amLODipine (NORVASC) 10 MG tablet TAKE 1 TABLET BY MOUTH EVERY DAY Patient taking differently: Take 10 mg by mouth every evening. 07/07/21  Yes Troy Borg, MD  Ascorbic Acid (VITAMIN C) 1000 MG tablet Take 1,000 mg by mouth daily.   Yes [provider]  aspirin EC 81 MG tablet Take 81 mg by mouth every other day. Swallow whole.   Yes [provider]  calcium carbonate (TUMS EX) 750 MG chewable tablet Chew 1 tablet by mouth daily as needed for heartburn.    Yes [provider]  Carboxymethylcellulose Sodium (LUBRICANT EYE DROPS OP) Place 1 drop into both eyes 2 (two) times daily as needed (dry/irritated eyes.).    Yes [provider]  Cholecalciferol (VITAMIN D-3) 25 MCG (1000 UT) CAPS  Take 1,000 Units by mouth every other day.   Yes [provider]  famotidine (PEPCID) 40 MG tablet TAKE 1 TABLET BY MOUTH EVERY DAY Patient taking differently: Take 40 mg by mouth daily. 08/16/21  Yes Troy Borg, MD  furosemide (LASIX) 40 MG tablet TAKE 1 TABLET BY MOUTH EVERY OTHER DAY Patient taking differently: Take 40 mg by mouth every other day. 11/15/21  Yes [name], [name], MD  gabapentin (NEURONTIN) 300 MG capsule Take 1 capsule (300 mg total) by mouth 2 (two) times daily. 11/11/21  Yes [name], [name], DO  isosorbide mononitrate (IMDUR) 30 MG 24 hr tablet Take 1 tablet (30 mg total) by mouth daily. 12/04/21  Yes [name], [name], [name]  levothyroxine (SYNTHROID) 25 MCG tablet TAKE 1 TABLET BY MOUTH EVERY DAY WITH BREAKFAST Patient taking differently: Take 25 mcg by mouth daily before breakfast. 06/29/21  Yes Troy Borg, MD  nitroGLYCERIN (NITROSTAT) 0.4 MG SL tablet PLACE 1 TABLET UNDER THE TONGUE EVERY 5 MINUTES AS NEEDED FOR CHEST PAIN. Patient taking differently: Place 0.4 mg under the tongue every 5 (five) minutes as needed for chest pain. 11/30/21  Yes [name], [name], MD  primidone (MYSOLINE) 50 MG tablet TAKE 1 TABLET BY MOUTH 2 TIMES DAILY 02/14/22  Yes [name], [name], DO  rosuvastatin (CRESTOR) 10 MG tablet TAKE 1/2 TABLET BY MOUTH EVERY DAY Patient taking differently: Take 5 mg by mouth every evening. 12/01/21  Yes [name],  [name], MD  tiZANidine (ZANAFLEX) 4 MG tablet Take 2 mg by mouth every 8 (eight) hours as needed for muscle spasms. 12/18/17  Yes [provider]  Turmeric 500 MG CAPS Take 500 mg by mouth daily.    Yes [provider]  vitamin B-12 (CYANOCOBALAMIN) 1000 MCG tablet Take 1,000 mcg by mouth every other day.   Yes [provider]  ranitidine (ZANTAC) 150 MG tablet TAKE 1 TABLET BY MOUTH 2 TIMES DAILY Patient taking differently: No sig reported 09/05/18 12/13/18  Troy Borg, MD    Allergies  Allergen Reactions   Acyclovir And  Related Other (See Comments)    Accumulates and causes stroke-like side-effects due to cytochrome P450 enzyme deficiency   Benzodiazepines Other (See Comments)    Patient states he gets stroke symptoms with benzo's.    Compazine [Prochlorperazine Maleate] Other (See Comments)    Accumulates and causes stroke-like side-effects due to cytochrome P450 enzyme deficiency   Coreg [Carvedilol] Other (See Comments)    Accumulates and causes stroke-like side-effects due to cytochrome P450 enzyme deficiency Patient poor metabolizer of CYP2D6 - Coreg undergoes extensive hepatic (including 2D6)   Flomax [Tamsulosin Hcl] Other (See Comments)    Accumulates and causes stroke-like side-effects due to cytochrome P450 enzyme deficiency   Lisinopril Other (See Comments)    Hyperkalemia. 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   Mobic [Meloxicam] Other (See Comments)    Accumulates and causes stroke-like side-effects due to cytochrome P450 enzyme deficiency   Morphine And Related Other (See Comments)    Per pt's wife morphine also contraindicated due to p450 enzyme deficiency.     Ondansetron Other (See Comments)    Accumulates and causes stroke-like side-effects due to cytochrome P450 enzyme deficiency    Oxycodone Other (See Comments)    Accumulates and causes stroke-like side-effects due to cytochrome P450 enzyme deficiency   Promethazine Other (See Comments)    Accumulates and causes stroke-like side-effects due to cytochrome P450 enzyme deficiency   Sertraline Hcl Other (See Comments)    Accumulates and causes stroke-like side-effects due to cytochrome P450 enzyme deficiency   Tramadol Other (See Comments)    Accumulates and causes stroke-like side-effects due to cytochrome P450 enzyme deficiency   Atorvastatin Other (See Comments)    MYALGIAS   Colchicine Other (See Comments)    ataxia   Fentanyl Other (See Comments)    Accumulates and cause  stroke like symptoms    Patient Active Problem List   Diagnosis Date Noted   Unstable angina ([place]) 12/02/2021   Intractable chronic migraine without aura and without status migrainosus 11/11/2021   Vitamin D deficiency 06/13/2021   Encounter for fitting and adjustment of hearing aid 11/26/2020   Encounter for fitting and adjustment of spectacles and contact lenses 11/26/2020   DNR (do not resuscitate) discussion 11/26/2020   Aortic atherosclerosis ([place]) 11/26/2020   Allergic rhinitis 10/05/2020   Cough 10/05/2020   Sensorineural hearing loss, bilateral 09/29/2020   Hallux rigidus 09/23/2020   Retinoschisis 09/23/2020   RUQ pain 06/10/2020   History of lacunar cerebrovascular accident 04/05/2020   Headache 03/16/2020   Ankle pain 01/22/2020   Hypothyroidism 11/26/2019   Epididymitis 08/28/2019   Unilateral primary osteoarthritis, left hip 08/13/2019   Status post total replacement of left hip 08/13/2019   Abdominal wall hernia 11/02/2018   Progressive angina ([place]) 10/25/2018   DOE (dyspnea on exertion) 10/25/2018   Abscess of right buttock 11/27/2017   Acute sinus  infection 11/05/2017   Pilon fracture 03/17/2017   Pilon fracture of left tibia 03/17/2017   At risk for adverse drug reaction 02/28/2017   Closed fracture dislocation of ankle joint, left, with delayed healing, subsequent encounter 02/22/2017   Renal stone 10/26/2016   Gross hematuria 10/26/2016   Bladder neck obstruction 10/26/2016   External hemorrhoid, thrombosed 06/10/2016   Internal hemorrhoids 06/10/2016   Lumbar back pain 05/19/2016   Osteoarthritis of right hip 10/27/2015   Encounter for well adult exam with abnormal findings 10/27/2015   Chronic chest pain 09/22/2015   Acute gouty arthritis 06/11/2015   Coronary artery disease involving native coronary artery with angina pectoris ([place]) 06/11/2015   Chronic diastolic CHF (congestive heart failure) ([place]) 06/11/2015   H/O tissue AVR Oct 2016 06/10/2015    PAF post CABG/AVR- Amiodarone 06/10/2015   Dyslipidemia 06/10/2015   S/P CABG x 4-Oct 2016 03/30/2015   H/O NSTEMI-Oct 2016 03/27/2015   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 ([place])    Depression 09/08/2014   GERD (gastroesophageal reflux disease)    Essential tremor 03/31/2014   Iliotibial band syndrome affecting left lower leg 01/01/2014   Spondylolisthesis at L4-L5 level 01/01/2014   Sinus bradycardia 12/31/2013   Greater trochanteric bursitis of right hip 12/04/2013   OSA on CPAP    Diet-controlled diabetes mellitus ([place]) 07/08/2009   COLONIC POLYPS, HX OF 12/31/2008   GOUT 12/22/2008   Essential hypertension 12/22/2008   Diverticulosis of colon 12/22/2008   NEPHROLITHIASIS, HX OF 12/22/2008    Past Medical History:  Diagnosis Date   Allergy    Anxiety    PTSD   Aortic stenosis, moderate 03/27/2015   post AVR echo Echo 11/16: Mild LVH, EF 55-60%, normal wall motion, grade 1 diastolic dysfunction, bioprosthetic AVR okay (mean gradient 18 mmHg) with trivial AI, mild LAE, atrial septal aneurysm, small effusion    Arthritis    "qwhere" (10/29/2015)   Cancer ([place])    skin cancer   Carotid artery stenosis    Carotid US 11/17: 1-39% bilateral ICA stenosis. F/u prn   Cervical stenosis of spinal canal    fusion 2006   CHF (congestive heart failure) ([place])    "secondry to [facility]"   Closed left pilon fracture    COLONIC POLYPS, HX OF    Complication of anesthesia    "takes a lot to put him under and has woken up during surgery" Difficult to wake up . PTSD; "does not metabolize RX well"that he gets "violent", per pt.    Coronary artery disease    DIVERTICULOSIS, COLON    Dysrhythmia    2 bouts of afib post op and at home but corrected with medication.   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 ([place])  confirmed heterozygous 10/24/14 labs   PTSD (post-traumatic stress disorder)    PTSD (post-traumatic stress disorder)    Sleep apnea    On a CPAP    Past Surgical History:  Procedure Laterality Date   ANKLE FUSION Left 03/17/2017   Procedure: FUSION  PILON FRACTURE WITH BONE GRAFTING;  Surgeon: Shona Needles, MD;  Location: [place];  Service: Orthopedics;  Laterality: Left;   ANTERIOR FUSION CERVICAL SPINE  2002   AORTIC VALVE REPLACEMENT N/A 03/30/2015   Procedure: AORTIC VALVE REPLACEMENT (AVR);  Surgeon: Grace Isaac, MD;  Location: [place];  Service: Open Heart Surgery;  Laterality: N/A;   CARDIAC CATHETERIZATION N/A 03/26/2015   Procedure: Left Heart Cath and Coronary Angiography;  Surgeon: Leonie Man, MD;  Location: [place] [facility];  Service: Cardiovascular;  Laterality: N/A;   CARDIAC VALVE REPLACEMENT     COLONOSCOPY W/ POLYPECTOMY     CORONARY ARTERY BYPASS GRAFT N/A 03/30/2015   Procedure: CORONARY ARTERY BYPASS GRAFTING (CABG) x four, using left internal mammary artery and left    leg greater saphenous vein harvested endoscopically ;  Surgeon: Grace Isaac, MD;  Location: [place];  Service: Open Heart Surgery;  Laterality: N/A;   CORONARY/GRAFT ANGIOGRAPHY N/A 12/03/2021   Procedure: CORONARY/GRAFT ANGIOGRAPHY;  Surgeon: [name], [name], MD;  Location: [place] [facility];  Service: Cardiovascular;  Laterality: N/A;   CYSTOSCOPY/URETEROSCOPY/HOLMIUM LASER/STENT PLACEMENT Bilateral 11/07/2016   Procedure: CYSTOSCOPY/URETEROSCOPY/HOLMIUM LASER/STENT PLACEMENT;  Surgeon: Nickie Retort, MD;  Location[facility];  Service: Urology;  Laterality: Bilateral;   CYSTOSCOPY/URETEROSCOPY/HOLMIUM LASER/STENT PLACEMENT Bilateral 11/23/2016   Procedure: CYSTOSCOPY/BILATERAL URETEROSCOPY/HOLMIUM LASER/STENT EXCHANGE;  Surgeon: Nickie Retort, MD;  Location: [facility];  Service: Urology;  Laterality: Bilateral;  NEEDS 90 MIN    EXTERNAL FIXATION LEG Left 02/22/2017   Procedure: EXTERNAL FIXATION LEFT ANKLE;  Surgeon: Renette Butters, MD;  Location: [facility];  Service: Orthopedics;  Laterality: Left;   HEMORRHOID BANDING  1970s   INGUINAL HERNIA REPAIR Left [place]   "cut me open"   LEFT HEART CATH AND CORS/GRAFTS ANGIOGRAPHY N/A 10/25/2018   Procedure: LEFT HEART CATH AND CORS/GRAFTS ANGIOGRAPHY;  Surgeon: Leonie Man, MD;  Location: [place] [facility];  Service: Cardiovascular;  Laterality: N/A;   LEFT HEART CATH AND CORS/GRAFTS ANGIOGRAPHY N/A 12/03/2021   Procedure: LEFT HEART CATH AND CORS/GRAFTS ANGIOGRAPHY;  Surgeon: [name], [name], MD;  Location: [place] [facility];  Service: Cardiovascular;  Laterality: N/A;   POSTERIOR FUSION CERVICAL SPINE  2006   TEE WITHOUT CARDIOVERSION N/A 03/30/2015   Procedure: TRANSESOPHAGEAL ECHOCARDIOGRAM (TEE);  Surgeon: Grace Isaac, MD;  Location: [place];  Service: Open Heart Surgery;  Laterality: N/A;   TESTICLE TORSION REDUCTION Right 1960   TOE FUSION Left 1985 & 2004   "great toe" and removal   TONSILLECTOMY     TOTAL HIP ARTHROPLASTY Right 10/27/2015   Procedure: RIGHT TOTAL HIP ARTHROPLASTY ANTERIOR APPROACH and steroid injection right foot;  Surgeon: Mcarthur Rossetti, MD;  Location: [facility];  Service: Orthopedics;  Laterality: Right;   TOTAL HIP ARTHROPLASTY Left 08/13/2019   TOTAL HIP ARTHROPLASTY Left 08/13/2019   Procedure: LEFT TOTAL HIP ARTHROPLASTY ANTERIOR APPROACH;  Surgeon: Mcarthur Rossetti, MD;  Location: [place];  Service: Orthopedics;  Laterality: Left;    Social History    Socioeconomic History   Marital status: Married    Spouse name: Karmen Bongo, RN   Number  of children: 2   Years of education: Not on file   Highest education level: Some college, no degree  Occupational History   Occupation: Retired Animal nutritionist  Tobacco Use   Smoking status: Some Days    Packs/day: 1.00    Years: 34.00    Total pack years: 34.00    Types: Cigarettes    Last attempt to quit: 06/06/1990    Years since quitting: 31.8   Smokeless tobacco: Never  Vaping Use   Vaping Use: Never used  Substance and Sexual Activity   Alcohol use: Yes    Alcohol/week: 3.0 standard drinks of alcohol    Types: 1 Cans of beer, 2 Shots of liquor per week    Comment: occassional   Drug use: No   Sexual activity: Yes  Other Topics Concern   Not on file  Social History Narrative   Left Handed    One Story home    Social Determinants of Health   Financial Resource Strain: Low Risk  (08/30/2021)   Overall Financial Resource Strain (CARDIA)    Difficulty of Paying Living Expenses: Not hard at all  Food Insecurity: No Food Insecurity (08/30/2021)   Hunger Vital Sign    Worried About Running Out of Food in the Last Year: Never true    Ran Out of Food in the Last Year: Never true  Transportation Needs: No Transportation Needs (08/30/2021)   PRAPARE - Hydrologist (Medical): No    Lack of Transportation (Non-Medical): No  Physical Activity: Insufficiently Active (08/30/2021)   Exercise Vital Sign    Days of Exercise per Week: 3 days    Minutes of Exercise per Session: 30 min  Stress: No Stress Concern Present (08/30/2021)   [place]    Feeling of Stress : Not at all  Social Connections: Moderately Integrated (08/30/2021)   Social Connection and Isolation Panel [NHANES]    Frequency of Communication with Friends and Family: Three times a week    Frequency of Social Gatherings with  Friends and Family: Three times a week    Attends Religious Services: Never    Active Member of Clubs or Organizations: Yes    Attends Archivist Meetings: 1 to 4 times per year    Marital Status: Living with partner  Intimate Partner Violence: Not At Risk (08/30/2021)   Humiliation, Afraid, Rape, and Kick questionnaire    Fear of Current or Ex-Partner: No    Emotionally Abused: No    Physically Abused: No    Sexually Abused: No    Family History  Problem Relation Age of Onset   Heart disease Mother    Hypertension Mother    Diabetes Mother    Heart disease Father    Hypertension Father    Tremor Sister    Healthy Brother    Healthy Son    Prostate cancer Maternal Uncle    Hypertension Maternal Grandmother    Heart disease Maternal Grandmother    Hypertension Maternal Grandfather    Heart disease Maternal Grandfather    Hypertension Paternal Grandfather    Heart disease Paternal Grandfather    Diabetes Paternal Grandfather    Alcohol abuse Other    Arthritis Other    Drug abuse Sister    Healthy Daughter    Colon polyps Neg Hx    Esophageal cancer Neg Hx    Rectal cancer Neg Hx    Stomach  cancer Neg Hx    Colon cancer Neg Hx      Review of Systems  Constitutional: Negative.  Negative for chills and fever.  HENT: Negative.  Negative for congestion and sore throat.   Respiratory: Negative.  Negative for cough and shortness of breath.   Cardiovascular: Negative.  Negative for chest pain and palpitations.  Gastrointestinal:  Negative for abdominal pain, diarrhea, nausea and vomiting.  Musculoskeletal:  Positive for back pain.  Skin: Negative.  Negative for rash.  Neurological: Negative.  Negative for dizziness and headaches.  All other systems reviewed and are negative.  Today's Vitals   04/20/22 1329 04/20/22 1413  BP: (!) 146/74 (!) 150/78  Pulse: (!) 118   Temp: 98.3 F (36.8 C)   TempSrc: Oral   SpO2: 93%   Weight: 211 lb 2 oz (95.8 kg)    Body  mass index is 34.08 kg/[place].   Physical Exam Vitals reviewed.  Constitutional:      Appearance: Normal appearance.  HENT:     Head: Normocephalic.  Eyes:     Extraocular Movements: Extraocular movements intact.  Cardiovascular:     Rate and Rhythm: Normal rate and regular rhythm.     Pulses: Normal pulses.     Heart sounds: Normal heart sounds.  Pulmonary:     Effort: Pulmonary effort is normal.     Breath sounds: Normal breath sounds.  Musculoskeletal:     Cervical back: No tenderness.     Comments: Positive tenderness with muscle spasm to right medial scapular area  Lymphadenopathy:     Cervical: No cervical adenopathy.  Skin:    General: Skin is warm and dry.  Neurological:     Mental Status: He is alert and oriented to person, place, and time.  Psychiatric:        Mood and Affect: Mood normal.        Behavior: Behavior normal.     EKG: Sinus bradycardia with first-degree AV block and ventricular rate of 54.  No acute ischemic changes.   ASSESSMENT & PLAN: A total of 47 minutes was spent with the patient and counseling/coordination of care regarding preparing for this visit, review of most recent office visit notes and available medical records, review of multiple chronic medical conditions and their management, review of all medications, diagnosis of musculoskeletal scapular pain, pain management, review of today's chest x-ray report, prognosis, documentation, and need for follow-up.  Problem List Items Addressed This Visit       Other   Musculoskeletal pain    No red flag signs or symptoms. Tylenol for pain management.  Unable to tolerate most prescription strength analgesics. Continue topical treatment and heat pad on and off as needed. Continue muscle relaxant. Advised to follow low up with PCP in the next couple of weeks.      Pain of left scapula - Primary    Musculoskeletal in nature.  Tenderness on palpation. No acute findings on EKG. Chest x-ray ordered  today. Tylenol for pain management.      Relevant Orders   EKG 12-Lead   DG Chest 2 View   Patient Instructions  Musculoskeletal Pain Musculoskeletal pain refers to aches and pains in your bones, joints, muscles, and the tissues that surround them. This pain can occur in any part of the body. It can last for a short time (acute) or a long time (chronic). A physical exam, lab tests, and imaging studies may be done to find the cause of your musculoskeletal  pain. Follow these instructions at home: Lifestyle Try to control or lower your stress levels. Stress increases muscle tension and can worsen musculoskeletal pain. It is important to recognize when you are anxious or stressed and learn ways to manage it. This may include: Meditation or yoga. Cognitive or behavioral therapy. Acupuncture or massage therapy. You may continue all activities unless the activities cause more pain. When the pain gets better, slowly resume your normal activities. Gradually increase the intensity and duration of your activities or exercise. Managing pain, stiffness, and swelling     Treatment may include medicines for pain and inflammation that are taken by mouth or applied to the skin. Take over-the-counter and prescription medicines only as told by your health care provider. When your pain is severe, bed rest may be helpful. Lie or sit in any position that is comfortable, but get out of bed and walk around at least every couple of hours. If directed, apply heat to the affected area as often as told by your health care provider. Use the heat source that your health care provider recommends, such as a moist heat pack or a heating pad. Place a towel between your skin and the heat source. Leave the heat on for 20-30 minutes. Remove the heat if your skin turns bright red. This is especially important if you are unable to feel pain, heat, or cold. You may have a greater risk of getting burned. If directed, put ice on  the painful area. To do this: Put ice in a plastic bag. Place a towel between your skin and the bag. Leave the ice on for 20 minutes, 2-3 times a day. Remove the ice if your skin turns bright red. This is very important. If you cannot feel pain, heat, or cold, you have a greater risk of damage to the area. General instructions Your health care provider may recommend that you see a physical therapist. This person can help you come up with a safe exercise program. If told by your health care provider, do physical therapy exercises to improve movement and strength in the affected area. Keep all follow-up visits. This is important. This includes any physical therapy visits. Contact a health care provider if: Your pain gets worse. Medicines do not help ease your pain. You cannot use the part of your body that hurts, such as your arm, leg, or neck. You have trouble sleeping. You have trouble doing your normal activities. Get help right away if: You have a new injury and your pain is worse or different. You feel numb or you have tingling in the painful area. Summary Musculoskeletal pain refers to aches and pains in your bones, joints, muscles, and the tissues that surround them. This pain can occur in any part of the body. Your health care provider may recommend that you see a physical therapist. This person can help you come up with a safe exercise program. Do any exercises as told by your physical therapist. Lower your stress level. Stress can worsen musculoskeletal pain. Ways to lower stress may include meditation, yoga, cognitive or behavioral therapy, acupuncture, and massage therapy. This information is not intended to replace advice given to you by your health care provider. Make sure you discuss any questions you have with your health care provider. Document Revised: 09/26/2019 Document Reviewed: 09/04/2019 Elsevier Patient Education  [place], MD [place]  Primary Care at [facility]

## 2022-04-20 NOTE — Assessment & Plan Note (Signed)
No red flag signs or symptoms. Tylenol for pain management.  Unable to tolerate most prescription strength analgesics. Continue topical treatment and heat pad on and off as needed. Continue muscle relaxant. Advised to follow low up with PCP in the next couple of weeks.

## 2022-05-02 ENCOUNTER — Other Ambulatory Visit: Payer: Self-pay | Admitting: Internal Medicine

## 2022-05-02 NOTE — Telephone Encounter (Signed)
Please refill as per office routine med refill policy (all routine meds to be refilled for 3 mo or monthly (per pt preference) up to one year from last visit, then month to month grace period for 3 mo, then further med refills will have to be denied) ? ?

## 2022-05-03 NOTE — Progress Notes (Signed)
05/06/2022 KOSTAS MARROW   09-30-1940  [id number]  Primary Physician [name], MD Primary Cardiologist: Dr Jamielynn Wigley [name]  HPI:  81 y.o. Male seen for follow up CAD. He was admitted 10/19-10/31/16 with a non-STEMI. LHC demonstrated 3 vessel CAD. He also had significant aortic stenosis.  He underwent CABG with LIMA-LAD, SVG-intermediate, SVG-LCx, SVG-distal RCA and bioprosthetic AVR.  Atenolol was initiated but later stopped secondary to bradycardia. He did develop atrial fibrillation. He was placed on amiodarone with return to NSR.  He has an essential tremor and is on Primidone chronically. 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  Amiodarone was stopped in January 2017 at the time he was hospitalized for an acute gout attack in his [place]. He has maintained NSR.  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 He has a daughter who is a family physician in [place].  In May 2018 he complained of more dyspnea on exertion and some chest tightness.  CXR was clear. Myoview study was normal.  In June 2018 he underwent cystoscopy and lithotripsy for renal stones.   In September 2018 he fell off his roof fracturing his left ankle and had a compression fracture of L2. He required extensive surgery of his ankle.   In May 2020 he presented with symptoms of increased DOE. He underwent cardiac cath showing all grafts were patent. Echo showed normal EF with evidence of diastolic dysfunction. The AV prothesis function had not changed. He had mild pulmonary HTN. His diuretic dose was increased.  He did undergo left THR in March 2022.    he presented to the ED on 12/02/2021 with chest pain that radiated to his back, unrelieved by nitroglycerin.  He was hospitalized from 12/02/2021 to 12/04/2021. Troponin was negative.  He underwent repeat cardiac catheterization which revealed patent bypass grafts, medical therapy was recommended and isosorbide was  added to his medication regimen.  Echocardiogram showed EF 55 to 60%, normal LV function, mild LVH, mildly reduced RV function, mild RV enlargement, moderate dilation of the left atrium, mild lesion of the right atrium, mild mitral valve regurgitation, stable bioprosthetic aortic valve with mild aortic stenosis.    On follow up today he is doing well. He has some intermittent chest pain but not bad enough to take Ntg. His breathing is doing well. He denies any chest pain or palpitations. He is seeing Dr [name] and getting an infusion for chronic HA. States this really hasn't helped much.    Current Outpatient Medications  Medication Sig Dispense Refill   acetaminophen (TYLENOL) 500 MG tablet Take 1,000 mg by mouth every 6 (six) hours as needed for moderate pain.     allopurinol (ZYLOPRIM) 300 MG tablet TAKE 1 TABLET BY MOUTH EVERY DAY 90 tablet 3   amLODipine (NORVASC) 10 MG tablet TAKE 1 TABLET BY MOUTH EVERY DAY (Patient taking differently: Take 10 mg by mouth every evening.) 90 tablet 3   Ascorbic Acid (VITAMIN C) 1000 MG tablet Take 1,000 mg by mouth daily.     aspirin EC 81 MG tablet Take 81 mg by mouth every other day. Swallow whole.     calcium carbonate (TUMS EX) 750 MG chewable tablet Chew 1 tablet by mouth daily as needed for heartburn.      Carboxymethylcellulose Sodium (LUBRICANT EYE DROPS OP) Place 1 drop into both eyes 2 (two) times daily as needed (dry/irritated eyes.).      Cholecalciferol (  VITAMIN D-3) 25 MCG (1000 UT) CAPS Take 1,000 Units by mouth every other day.     famotidine (PEPCID) 40 MG tablet TAKE 1 TABLET BY MOUTH EVERY DAY (Patient taking differently: Take 40 mg by mouth daily.) 90 tablet 2   furosemide (LASIX) 40 MG tablet TAKE 1 TABLET BY MOUTH EVERY OTHER DAY (Patient taking differently: Take 40 mg by mouth every other day.) 45 tablet 2   gabapentin (NEURONTIN) 300 MG capsule Take 1 capsule (300 mg total) by mouth 2 (two) times daily. 180 capsule 2   isosorbide  mononitrate (IMDUR) 30 MG 24 hr tablet Take 1 tablet (30 mg total) by mouth daily. 30 tablet 11   levothyroxine (SYNTHROID) 25 MCG tablet TAKE 1 TABLET BY MOUTH EVERY DAY WITH BREAKFAST (Patient taking differently: Take 25 mcg by mouth daily before breakfast.) 90 tablet 3   nitroGLYCERIN (NITROSTAT) 0.4 MG SL tablet PLACE 1 TABLET UNDER THE TONGUE EVERY 5 MINUTES AS NEEDED FOR CHEST PAIN. (Patient taking differently: Place 0.4 mg under the tongue every 5 (five) minutes as needed for chest pain.) 25 tablet 3   primidone (MYSOLINE) 50 MG tablet TAKE 1 TABLET BY MOUTH 2 TIMES DAILY 180 tablet 0   rosuvastatin (CRESTOR) 10 MG tablet TAKE 1/2 TABLET BY MOUTH EVERY DAY (Patient taking differently: Take 5 mg by mouth every evening.) 45 tablet 3   tiZANidine (ZANAFLEX) 4 MG tablet Take 2 mg by mouth every 8 (eight) hours as needed for muscle spasms.     Turmeric 500 MG CAPS Take 500 mg by mouth daily.      vitamin B-12 (CYANOCOBALAMIN) 1000 MCG tablet Take 1,000 mcg by mouth every other day.     No current facility-administered medications for this visit.    Allergies  Allergen Reactions   Acyclovir And Related Other (See Comments)    Accumulates and causes stroke-like side-effects due to cytochrome P450 enzyme deficiency   Benzodiazepines Other (See Comments)    Patient states he gets stroke symptoms with benzo's.    Compazine [Prochlorperazine Maleate] Other (See Comments)    Accumulates and causes stroke-like side-effects due to cytochrome P450 enzyme deficiency   Coreg [Carvedilol] Other (See Comments)    Accumulates and causes stroke-like side-effects due to cytochrome P450 enzyme deficiency Patient poor metabolizer of CYP2D6 - Coreg undergoes extensive hepatic (including 2D6)   Flomax [Tamsulosin Hcl] Other (See Comments)    Accumulates and causes stroke-like side-effects due to cytochrome P450 enzyme deficiency   Lisinopril Other (See Comments)    Hyperkalemia. Accumulates and causes  stroke-like side-effects due to cytochrome P450 enzyme deficiency.   Mobic [Meloxicam] Other (See Comments)    Accumulates and causes stroke-like side-effects due to cytochrome P450 enzyme deficiency   Morphine And Related Other (See Comments)    Per pt's wife morphine also contraindicated due to p450 enzyme deficiency.     Ondansetron Other (See Comments)    Accumulates and causes stroke-like side-effects due to cytochrome P450 enzyme deficiency    Oxycodone Other (See Comments)    Accumulates and causes stroke-like side-effects due to cytochrome P450 enzyme deficiency   Promethazine Other (See Comments)    Accumulates and causes stroke-like side-effects due to cytochrome P450 enzyme deficiency   Sertraline Hcl Other (See Comments)    Accumulates and causes stroke-like side-effects due to cytochrome P450 enzyme deficiency   Tramadol Other (See Comments)    Accumulates and causes stroke-like side-effects due to cytochrome P450 enzyme deficiency   Atorvastatin Other (See Comments)  MYALGIAS   Colchicine Other (See Comments)    ataxia   Fentanyl Other (See Comments)    Accumulates and cause stroke like symptoms    Social History   Socioeconomic History   Marital status: Married    Spouse name: Karmen Bongo, RN   Number of children: 2   Years of education: Not on file   Highest education level: Some college, no degree  Occupational History   Occupation: Retired Animal nutritionist  Tobacco Use   Smoking status: Some Days    Packs/day: 1.00    Years: 34.00    Total pack years: 34.00    Types: Cigarettes    Last attempt to quit: 06/06/1990    Years since quitting: 31.9   Smokeless tobacco: Never  Vaping Use   Vaping Use: Never used  Substance and Sexual Activity   Alcohol use: Yes    Alcohol/week: 3.0 standard drinks of alcohol    Types: 1 Cans of beer, 2 Shots of liquor per week    Comment: occassional   Drug use: No   Sexual activity: Yes  Other Topics Concern    Not on file  Social History Narrative   Left Handed    One Story home    Social Determinants of Health   Financial Resource Strain: Low Risk  (08/30/2021)   Overall Financial Resource Strain (CARDIA)    Difficulty of Paying Living Expenses: Not hard at all  Food Insecurity: No Food Insecurity (08/30/2021)   Hunger Vital Sign    Worried About Running Out of Food in the Last Year: Never true    Ran Out of Food in the Last Year: Never true  Transportation Needs: No Transportation Needs (08/30/2021)   PRAPARE - Hydrologist (Medical): No    Lack of Transportation (Non-Medical): No  Physical Activity: Insufficiently Active (08/30/2021)   Exercise Vital Sign    Days of Exercise per Week: 3 days    Minutes of Exercise per Session: 30 min  Stress: No Stress Concern Present (08/30/2021)   [place]    Feeling of Stress : Not at all  Social Connections: Moderately Integrated (08/30/2021)   Social Connection and Isolation Panel [NHANES]    Frequency of Communication with Friends and Family: Three times a week    Frequency of Social Gatherings with Friends and Family: Three times a week    Attends Religious Services: Never    Active Member of Clubs or Organizations: Yes    Attends Archivist Meetings: 1 to 4 times per year    Marital Status: Living with partner  Intimate Partner Violence: Not At Risk (08/30/2021)   Humiliation, Afraid, Rape, and Kick questionnaire    Fear of Current or Ex-Partner: No    Emotionally Abused: No    Physically Abused: No    Sexually Abused: No     Review of Systems: As noted in HPI All other systems reviewed and are otherwise negative except as noted above.   Blood pressure (!) 154/81, pulse 67, height 5' 8.5" (1.74 m), weight 207 lb 6 oz (94.1 kg).  GENERAL:  Well appearing WM in NAD HEENT:  PERRL, EOMI, sclera are clear. Oropharynx is clear. NECK:  No  jugular venous distention, carotid upstroke brisk and symmetric, no bruits, no thyromegaly or adenopathy LUNGS:  Clear to auscultation bilaterally CHEST:  Unremarkable, old sternotomy scar HEART:  RRR,  PMI not displaced or sustained,S1  and S2 within normal limits, no S3, no S4: no clicks, no rubs, gr 2/6 systolic murmur RUSB.  ABD:  Soft, nontender. BS +, no masses or bruits. No hepatomegaly, no splenomegaly EXT:  2 + pulses throughout, Tr edema on left- left calf is larger than right., no cyanosis no clubbing SKIN:  Warm and dry.  No rashes NEURO:  Alert and oriented x 3. Cranial nerves II through XII intact. PSYCH:  Cognitively intact    Lab Results  Component Value Date   WBC 7.8 12/04/2021   HGB 15.2 12/04/2021   HCT 45.0 12/04/2021   PLT 140 (L) 12/04/2021   GLUCOSE 112 (H) 12/04/2021   CHOL 118 12/03/2021   TRIG 177 (H) 12/03/2021   HDL 35 (L) 12/03/2021   LDLDIRECT 73.0 06/09/2021   LDLCALC 48 12/03/2021   ALT 29 06/09/2021   AST 23 06/09/2021   NA 140 12/04/2021   K 4.2 12/04/2021   CL 107 12/04/2021   CREATININE 1.17 12/04/2021   BUN 19 12/04/2021   CO2 22 12/04/2021   TSH 6.138 (H) 12/04/2021   PSA 1.49 06/10/2020   INR 1.1 12/02/2021   HGBA1C 6.1 (A) 12/21/2021   MICROALBUR 8.8 (H) 06/10/2020     Echo 04/24/15: Study Conclusions   - Left ventricle: The cavity size was normal. Wall thickness was   increased in a pattern of mild LVH. Systolic function was normal.   The estimated ejection fraction was in the range of 55% to 60%.   Wall motion was normal; there were no regional wall motion   abnormalities. Doppler parameters are consistent with abnormal   left ventricular relaxation (grade 1 diastolic dysfunction). - Aortic valve: A bioprosthesis was present. There was trivial   regurgitation. - Left atrium: The atrium was mildly dilated. - Atrial septum: There was an atrial septal aneurysm. - Pericardium, extracardiac: A small pericardial effusion was    identified.   Impressions:   - Normal LV systolic function; grade 1 diastolic dysfunction; mild   LVH; s/p AVR with trace AI and mean gradient of 18 mmHg; mild   LAE; trace MR.  Myoview 10/22/16: Study Highlights     The left ventricular ejection fraction is mildly decreased (45-54%). Nuclear stress EF: 53%. No T wave inversion was noted during stress. There was no ST segment deviation noted during stress. This is a low risk study.   No reversible ischemia. LVEF 53% with normal wall motion. This is a low risk study.     Cardiac cath: 10/25/18:  LEFT HEART CATH AND CORS/GRAFTS ANGIOGRAPHY  Conclusion    Ost LAD to Prox LAD lesion is 80% stenosed. Mid LAD lesion is 100% stenosed -after second septal branch which coursed is a tandem LAD LIMA-dLAD graft was visualized by angiography and is normal in caliber. The graft exhibits no disease. Ost Ramus-1 lesion is 90% stenosed. Ost Ramus-2 lesion is 60% stenosed (shortly after lateral branch). SVG-Ramus graft was visualized by angiography and is large. The graft exhibits minimal luminal irregularities. Ost Cx lesion is 80% stenosed. Prox Cx to Mid Cx lesion is 80% stenosed. Mid Cx lesion is 60% stenosed. SVG-dCx graft was visualized by angiography and is large. The graft exhibits minimal luminal irregularities. Retrograde filling of OM 2 and AV groove circumflex Prox RCA to Mid RCA lesion is 100% stenosed. This is progression from 99% stenosis pre-CABG. SVG-dRCA graft was visualized by angiography and is large. The graft exhibits no disease. Unable to cross aortic valve prosthesis.  SUMMARY Severe native CAD essentially unchanged from post CABG with exception of now 100% occluded RCA. 4 out of 4 patent grafts: LIMA-LAD, SVG-RI (D1), SVG-dCx, SVG-D RCA Systemic Hypertension Angiographically small aortic root/stent prosthetic valve.  Unable to cross.     RECOMMENDATIONS Discharge home after bedrest and hydration Consider 2D  echocardiogram to assess for restenosis of aortic valve Pending echocardiographic evaluation, would consider non-anginal etiology -> with hypertension, could be related to diastolic dysfunction.     Glenetta Hew, MD   Echo 11/20/18:IMPRESSIONS      1. A 62m an Edwards porcine bioprosthesis valve is present in the aortic position. Procedure Date: 03/30/15 Echo findings shows no evidence of regurgitation, perivalvular leak, rocking or dehiscence of the aortic prosthesis. Bioprosthetic leaflets not  well seen. Mean systolic gradient is 19 mmHg, no significant difference from gradient reported 1 month post operative in 04/2015.  2. The left ventricle has normal systolic function with an ejection fraction of 60-65%. The cavity size was normal. There is mildly increased left ventricular wall thickness. Left ventricular diastolic Doppler parameters are consistent with impaired  relaxation due to nondiagnostic images. Elevated left ventricular end-diastolic pressure The E/e' is 20.  3. The right ventricle has normal systolic function. The cavity was normal. There is no increase in right ventricular wall thickness. Right ventricular systolic pressure is mildly elevated with an estimated pressure of 35.9 mmHg.  4. Left atrial size was mild-moderately dilated.  5. There is mild mitral annular calcification present.  6. There is mild dilatation of the ascending aorta measuring 40 mm.   Cardiac cath 12/03/21:  LEFT HEART CATH AND CORS/GRAFTS ANGIOGRAPHY  CORONARY/GRAFT ANGIOGRAPHY   Conclusion      Ost LAD to Prox LAD lesion is 80% stenosed.   Mid LAD lesion is 100% stenosed.   Ost Cx lesion is 80% stenosed.   Prox RCA to Mid RCA lesion is 100% stenosed.   Ost Ramus-1 lesion is 90% stenosed.   Ost Ramus-2 lesion is 60% stenosed.   Prox Cx-2 lesion is 100% stenosed.   Prox Cx-1 lesion is 80% stenosed.   LIMA and is normal in caliber.   SVG and is large.   SVG and is large.   SVG and is large.    The graft exhibits no disease.   The graft exhibits minimal luminal irregularities.   The graft exhibits minimal luminal irregularities.   The graft exhibits no disease.   3 vessel obstructive CAD.  All grafts are still patent No significant change from 2020.   Plan: recommend medical therapy. Some of his angina could be explained by disease in the native LCx proximal to a relatively small OM. Again this hasn't changed significantly.   Diagnostic Dominance: Right  Intervention   Echo 12/04/21: IMPRESSIONS     1. Left ventricular ejection fraction, by estimation, is 55 to 60%. The  left ventricle has normal function. The left ventricle has no regional  wall motion abnormalities. There is mild left ventricular hypertrophy.  Left ventricular diastolic parameters  were normal.   2. Right ventricular systolic function is mildly reduced. The right  ventricular size is mildly enlarged.   3. Left atrial size was moderately dilated.   4. Right atrial size was mildly dilated.   5. The mitral valve is abnormal. Mild mitral valve regurgitation. No  evidence of mitral stenosis. Moderate mitral annular calcification.   6. 23 mm Edwards porcine bioprosthetic valve no AR mean gradient improved  since TTE done 11/1818 19.2->  16.5 mmHg. The aortic valve has been  repaired/replaced. There is mild calcification of the aortic valve. There  is mild thickening of the aortic valve.   Aortic valve regurgitation is not visualized. Mild aortic valve stenosis.  There is a 23 mm Edwards porcine valve present in the aortic position.   7. The inferior vena cava is normal in size with greater than 50%  respiratory variability, suggesting right atrial pressure of 3 mmHg.    ASSESSMENT AND PLAN:  1.  CAD s/p CABG in October 2016 following NSTEMI.   Myoview negative in May 2018. Cardiac cath in May 2020 showed all grafts were patent. Infrequent  angina. Continue statin. Not on beta blocker due to bradycardia.  Will monitor his symptoms. Let me know if angina increases in frequency or severity.  2. S/p bioprosthetic AVR for aortic stenosis. On routine SBE prophylaxis.  Echo June 2020 unchanged from initial post op Echo.  3.  Hyperlipidemia. Satisfactory on Crestor 5 mg daily. Plan repeat lab work with Dr Jenny Reichmann   4. HTN. With white coat syndrome. BP at home 130/70 per wife who is a Marine scientist.  Continue same meds. Not a candidate for beta blocker due to bradycardia, would avoid ACEi, ARB, aldactone with elevated potassium.  5. 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    Follow up in 6 months      Zoya Sprecher [name] MD,FACC 05/06/2022 8:53 AM

## 2022-05-06 ENCOUNTER — Encounter: Payer: Self-pay | Admitting: Cardiology

## 2022-05-06 ENCOUNTER — Ambulatory Visit: Payer: Medicare Other | Attending: Cardiology | Admitting: Cardiology

## 2022-05-06 VITALS — BP 154/81 | HR 67 | Ht 68.5 in | Wt 207.4 lb

## 2022-05-06 DIAGNOSIS — I25118 Atherosclerotic heart disease of native coronary artery with other forms of angina pectoris: Secondary | ICD-10-CM | POA: Diagnosis not present

## 2022-05-06 DIAGNOSIS — E785 Hyperlipidemia, unspecified: Secondary | ICD-10-CM | POA: Diagnosis not present

## 2022-05-06 DIAGNOSIS — I10 Essential (primary) hypertension: Secondary | ICD-10-CM

## 2022-05-06 DIAGNOSIS — I350 Nonrheumatic aortic (valve) stenosis: Secondary | ICD-10-CM

## 2022-05-06 DIAGNOSIS — Z952 Presence of prosthetic heart valve: Secondary | ICD-10-CM

## 2022-05-06 NOTE — Progress Notes (Signed)
Letter sent to patient via mychart.  

## 2022-05-12 ENCOUNTER — Ambulatory Visit: Payer: Medicare Other | Admitting: Neurology

## 2022-05-13 ENCOUNTER — Ambulatory Visit: Payer: Medicare Other | Admitting: Podiatry

## 2022-05-17 NOTE — Progress Notes (Unsigned)
Assessment/Plan:    1.  Essential Tremor  -Status post focused ultrasound to the right VIM at [facility] in January, 2022. I do think he is more unbalanced than he was prior to the surgery  -they are very interested in having the L VIM done.  This is now approved.  Told them that they could just call to [facility] and make a f/u with Dr. Dewain Penning.  Worried about balance but they can discuss in person  -continue primidone 50 mg twice per day, for now   2.  Chronic migraine  -he did not find botox helpful and has stopped that  -didn't find gabapentin helpful.  Will d/c -on vyepti q 3 months.  Didn't find helpful.  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  However, I did tell them that I couldn't write that it was from agent orange, although wife states that the literate does support this (and it may, but I am not aware of it)  3.  Chronic LBP as well as left shoulder pain  -follows with Dr. Ninfa Linden and having injections with Dr. Ernestina Patches  Subjective:   Troy Banks was seen today in follow up for follow-up chronic migraine, status post Botox on Oct 16, 2020.  Pt with sig other, who supplements hx. Pt now on vyepti. He had his first infusion 6/21 and then second 9/22.  They state today that they have not helped with the headaches.  "There have been no relief with anything."  They think that its related to cyp450 issues and ask me to write letter to [facility] about difficulty in tx of him due to limited meds.Separate from this, the patient was in the hospital for angina at the end of June/beginning of July.  He did have a heart cath, which looked good, without significant change from 2020.  Current prescribed movement disorder medications: Primidone, 50 mg twice per day  Gabapentin, 300 mg at bedtime   PREVIOUS MEDICATIONS: Not  beta-blocker candidate because of bradycardia; not topiramate candidate because of history of nephrolithiasis; primidone; botox; gabapentin; vyepti  ALLERGIES:   Allergies  Allergen Reactions   Acyclovir And Related Other (See Comments)    Accumulates and causes stroke-like side-effects due to cytochrome P450 enzyme deficiency   Benzodiazepines Other (See Comments)    Patient states he gets stroke symptoms with benzo's.    Compazine [Prochlorperazine Maleate] Other (See Comments)    Accumulates and causes stroke-like side-effects due to cytochrome P450 enzyme deficiency   Coreg [Carvedilol] Other (See Comments)    Accumulates and causes stroke-like side-effects due to cytochrome P450 enzyme deficiency Patient poor metabolizer of CYP2D6 - Coreg undergoes extensive hepatic (including 2D6)   Flomax [Tamsulosin Hcl] Other (See Comments)    Accumulates and causes stroke-like side-effects due to cytochrome P450 enzyme deficiency   Lisinopril Other (See Comments)    Hyperkalemia. 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   Mobic [Meloxicam] Other (See Comments)    Accumulates and causes stroke-like side-effects due to cytochrome P450 enzyme deficiency   Morphine And Related Other (See Comments)    Per pt's wife morphine also contraindicated due to p450 enzyme deficiency.     Ondansetron Other (See Comments)    Accumulates and causes stroke-like side-effects due to cytochrome P450 enzyme  deficiency    Oxycodone Other (See Comments)    Accumulates and causes stroke-like side-effects due to cytochrome P450 enzyme deficiency   Promethazine Other (See Comments)    Accumulates and causes stroke-like side-effects due to cytochrome P450 enzyme deficiency   Sertraline Hcl Other (See Comments)    Accumulates and causes stroke-like side-effects due to cytochrome P450 enzyme deficiency   Tramadol Other (See Comments)    Accumulates and causes stroke-like  side-effects due to cytochrome P450 enzyme deficiency   Atorvastatin Other (See Comments)    MYALGIAS   Colchicine Other (See Comments)    ataxia   Fentanyl Other (See Comments)    Accumulates and cause stroke like symptoms    CURRENT MEDICATIONS:  Outpatient Encounter Medications as of 05/19/2022  Medication Sig   acetaminophen (TYLENOL) 500 MG tablet Take 1,000 mg by mouth every 6 (six) hours as needed for moderate pain.   allopurinol (ZYLOPRIM) 300 MG tablet TAKE 1 TABLET BY MOUTH EVERY DAY   amLODipine (NORVASC) 10 MG tablet TAKE 1 TABLET BY MOUTH EVERY DAY (Patient taking differently: Take 10 mg by mouth every evening.)   Ascorbic Acid (VITAMIN C) 1000 MG tablet Take 1,000 mg by mouth daily.   aspirin EC 81 MG tablet Take 81 mg by mouth every other day. Swallow whole.   calcium carbonate (TUMS EX) 750 MG chewable tablet Chew 1 tablet by mouth daily as needed for heartburn.    Carboxymethylcellulose Sodium (LUBRICANT EYE DROPS OP) Place 1 drop into both eyes 2 (two) times daily as needed (dry/irritated eyes.).    Cholecalciferol (VITAMIN D-3) 25 MCG (1000 UT) CAPS Take 1,000 Units by mouth every other day.   famotidine (PEPCID) 40 MG tablet TAKE 1 TABLET BY MOUTH EVERY DAY (Patient taking differently: Take 40 mg by mouth daily.)   furosemide (LASIX) 40 MG tablet TAKE 1 TABLET BY MOUTH EVERY OTHER DAY (Patient taking differently: Take 40 mg by mouth every other day.)   gabapentin (NEURONTIN) 300 MG capsule Take 1 capsule (300 mg total) by mouth 2 (two) times daily.   isosorbide mononitrate (IMDUR) 30 MG 24 hr tablet Take 1 tablet (30 mg total) by mouth daily.   levothyroxine (SYNTHROID) 25 MCG tablet TAKE 1 TABLET BY MOUTH EVERY DAY WITH BREAKFAST (Patient taking differently: Take 25 mcg by mouth daily before breakfast.)   nitroGLYCERIN (NITROSTAT) 0.4 MG SL tablet PLACE 1 TABLET UNDER THE TONGUE EVERY 5 MINUTES AS NEEDED FOR CHEST PAIN. (Patient taking differently: Place 0.4 mg under the  tongue every 5 (five) minutes as needed for chest pain.)   primidone (MYSOLINE) 50 MG tablet TAKE 1 TABLET BY MOUTH 2 TIMES DAILY   rosuvastatin (CRESTOR) 10 MG tablet TAKE 1/2 TABLET BY MOUTH EVERY DAY (Patient taking differently: Take 5 mg by mouth every evening.)   tiZANidine (ZANAFLEX) 4 MG tablet Take 2 mg by mouth every 8 (eight) hours as needed for muscle spasms.   Turmeric 500 MG CAPS Take 500 mg by mouth daily.    vitamin B-12 (CYANOCOBALAMIN) 1000 MCG tablet Take 1,000 mcg by mouth every other day.   [DISCONTINUED] ranitidine (ZANTAC) 150 MG tablet TAKE 1 TABLET BY MOUTH 2 TIMES DAILY (Patient taking differently: No sig reported)   No facility-administered encounter medications on file as of 05/19/2022.     Objective:    PHYSICAL EXAMINATION:    VITALS:   Vitals:   05/19/22 0904  BP: (!) 142/78  Pulse: 62  SpO2: 96%  Weight: 211 lb 12.8  oz (96.1 kg)  Height: '5\' 8"'$  (1.727 m)       GEN:  The patient appears stated age and is in NAD. HEENT:  Normocephalic, atraumatic.  The mucous membranes are moist.   Neurological examination:  Orientation: The patient is alert and oriented x3.  He is slow to respond and looks to wife for assist Cranial nerves: There is good facial symmetry. The speech is fluent and clear. Soft palate rises symmetrically and there is no tongue deviation. Hearing is intact to conversational tone. Sensation: Sensation is intact to light touch throughout Motor: Strength is at least antigravity x4.  Movement examination: Tone: There is normal tone in the UE/LE Abnormal movements: There is mild postural tremor on the R only Coordination:  There is no decremation with RAM's,  Gait and Station: The patient is wide-based and slightly unbalanced    Chemistry      Component Value Date/Time   NA 140 12/04/2021 0055   NA 143 10/19/2018 1411   K 4.2 12/04/2021 0055   CL 107 12/04/2021 0055   CO2 22 12/04/2021 0055   BUN 19 12/04/2021 0055   BUN 17  10/19/2018 1411   CREATININE 1.17 12/04/2021 0055   CREATININE 0.94 03/04/2016 0918      Component Value Date/Time   CALCIUM 9.4 12/04/2021 0055   ALKPHOS 55 06/09/2021 1406   AST 23 06/09/2021 1406   ALT 29 06/09/2021 1406   BILITOT 1.2 06/09/2021 1406      Lab Results  Component Value Date   WBC 7.8 12/04/2021   HGB 15.2 12/04/2021   HCT 45.0 12/04/2021   MCV 93.0 12/04/2021   PLT 140 (L) 12/04/2021   Lab Results  Component Value Date   TSH 6.138 (H) 12/04/2021     Chemistry      Component Value Date/Time   NA 140 12/04/2021 0055   NA 143 10/19/2018 1411   K 4.2 12/04/2021 0055   CL 107 12/04/2021 0055   CO2 22 12/04/2021 0055   BUN 19 12/04/2021 0055   BUN 17 10/19/2018 1411   CREATININE 1.17 12/04/2021 0055   CREATININE 0.94 03/04/2016 0918      Component Value Date/Time   CALCIUM 9.4 12/04/2021 0055   ALKPHOS 55 06/09/2021 1406   AST 23 06/09/2021 1406   ALT 29 06/09/2021 1406   BILITOT 1.2 06/09/2021 1406      Lab Results  Component Value Date   ESRSEDRATE 4 07/22/2020      Total time spent on today's visit was 36 minutes, including both face-to-face time and nonface-to-face time.  Time included that spent on review of records (prior notes available to me/labs/imaging if pertinent), discussing treatment and goals, answering patient's questions and coordinating care.  Cc:  [name], MD

## 2022-05-19 ENCOUNTER — Encounter: Payer: Self-pay | Admitting: Neurology

## 2022-05-19 ENCOUNTER — Ambulatory Visit: Payer: Medicare Other | Admitting: Podiatry

## 2022-05-19 ENCOUNTER — Ambulatory Visit: Payer: Medicare Other | Admitting: Neurology

## 2022-05-19 ENCOUNTER — Encounter: Payer: Self-pay | Admitting: Podiatry

## 2022-05-19 ENCOUNTER — Other Ambulatory Visit: Payer: Self-pay

## 2022-05-19 VITALS — BP 142/78 | HR 62 | Ht 68.0 in | Wt 211.8 lb

## 2022-05-19 DIAGNOSIS — G43709 Chronic migraine without aura, not intractable, without status migrainosus: Secondary | ICD-10-CM

## 2022-05-19 DIAGNOSIS — L600 Ingrowing nail: Secondary | ICD-10-CM | POA: Diagnosis not present

## 2022-05-19 DIAGNOSIS — G250 Essential tremor: Secondary | ICD-10-CM | POA: Diagnosis not present

## 2022-05-19 NOTE — Patient Instructions (Signed)

## 2022-05-19 NOTE — Patient Instructions (Signed)
STOP vyepti Decrease gabapentin to 1 capsule daily for a week and then STOP gabapentin Call Dr. Dewain Penning and make a follow up appointment to ask about focused ultrasound on the other side I will make a referral to Dr. Winfred Burn at the [facility]

## 2022-05-20 ENCOUNTER — Other Ambulatory Visit: Payer: Self-pay | Admitting: Neurology

## 2022-05-20 ENCOUNTER — Other Ambulatory Visit: Payer: Self-pay | Admitting: Cardiology

## 2022-05-20 DIAGNOSIS — G250 Essential tremor: Secondary | ICD-10-CM

## 2022-05-20 DIAGNOSIS — G43709 Chronic migraine without aura, not intractable, without status migrainosus: Secondary | ICD-10-CM

## 2022-05-20 NOTE — Progress Notes (Signed)
Subjective:   Patient ID: Troy Banks, male   DOB: 81 y.o.   MRN: [id number]   HPI Patient states has had 2 ingrown toenails which have been driving me crazy and he would like to have this fixed.  States they both get very sore and that they make it hard for him to wear shoe gear comfortably   ROS      Objective:  Physical Exam  Neurovascular status intact with incurvated medial lateral borders of the right hallux lateral border left hallux that are all painful and make shoe gear difficult.  The nail itself is thickened bilateral and patient has good digital perfusion well-oriented x 3     Assessment:  Chronic ingrown toenail deformity hallux bilateral with pain     Plan:  H&P educated him on condition recommended correction.  Patient would like to have this done and I explained procedure risk to patient and he is willing to accept the risk.  Today I went ahead and I allowed him to sign consent form and I then infiltrated each hallux 60 mg Xylocaine Marcaine mixture sterile prep done using sterile instrumentation remove the medial lateral border of the right big toe and the medial border left hallux and exposed matrix bilateral applied phenol 3 applications 30 seconds followed by alcohol lavage sterile dressing gave instructions on soaks and to wear dressings 24 hours but take them off earlier if any throbbing were to occur.  Encouraged to call questions concerns

## 2022-05-31 ENCOUNTER — Ambulatory Visit: Payer: Medicare Other

## 2022-06-17 ENCOUNTER — Encounter: Payer: Self-pay | Admitting: Podiatry

## 2022-06-17 ENCOUNTER — Ambulatory Visit: Payer: Medicare Other | Admitting: Podiatry

## 2022-06-17 DIAGNOSIS — M79674 Pain in right toe(s): Secondary | ICD-10-CM

## 2022-06-17 DIAGNOSIS — E119 Type 2 diabetes mellitus without complications: Secondary | ICD-10-CM | POA: Diagnosis not present

## 2022-06-17 DIAGNOSIS — B351 Tinea unguium: Secondary | ICD-10-CM | POA: Diagnosis not present

## 2022-06-17 DIAGNOSIS — M79675 Pain in left toe(s): Secondary | ICD-10-CM

## 2022-06-17 NOTE — Progress Notes (Signed)
This patient returns to the office for evaluation and treatment of long thick painful nails .  This patient is unable to trim his own nails since the patient cannot reach the feet.  Patient says the nails are painful walking and wearing his shoes.  He returns for preventive foot care services.    General Appearance  Alert, conversant and in no acute stress.  Vascular  Dorsalis pedis and posterior tibial  pulses are palpable  bilaterally.  Capillary return is within normal limits  bilaterally. Temperature is within normal limits  bilaterally.  Neurologic  Senn-Weinstein monofilament wire test within normal limits  bilaterally. Muscle power within normal limits bilaterally.  Nails Thick disfigured discolored nails with subungual debris  from hallux to fifth toes bilaterally. No evidence of bacterial infection or drainage bilaterally.  Orthopedic  No limitations of motion  feet .  No crepitus or effusions noted.  No bony pathology or digital deformities noted.  Ankle/STJ immobile left foot/ankle.  Fused 1st MPJ right foot.  Skin  normotropic skin with no porokeratosis noted bilaterally.  No signs of infections or ulcers noted.     Onychomycosis  Pain in toes right foot  Pain in toes left foot  Debridement  of nails  1-5  B/L with a nail nipper.  Nails were then filed using a dremel tool with no incidents.    RTC  3 month   Gardiner Barefoot DPM

## 2022-06-23 ENCOUNTER — Ambulatory Visit: Payer: Medicare Other | Admitting: Internal Medicine

## 2022-06-28 ENCOUNTER — Ambulatory Visit (INDEPENDENT_AMBULATORY_CARE_PROVIDER_SITE_OTHER): Payer: Medicare Other | Admitting: Internal Medicine

## 2022-06-28 VITALS — BP 152/82 | HR 64 | Temp 98.4°F | Ht 68.0 in | Wt 210.0 lb

## 2022-06-28 DIAGNOSIS — E559 Vitamin D deficiency, unspecified: Secondary | ICD-10-CM | POA: Diagnosis not present

## 2022-06-28 DIAGNOSIS — Z0001 Encounter for general adult medical examination with abnormal findings: Secondary | ICD-10-CM | POA: Diagnosis not present

## 2022-06-28 DIAGNOSIS — I10 Essential (primary) hypertension: Secondary | ICD-10-CM | POA: Diagnosis not present

## 2022-06-28 DIAGNOSIS — L989 Disorder of the skin and subcutaneous tissue, unspecified: Secondary | ICD-10-CM | POA: Diagnosis not present

## 2022-06-28 DIAGNOSIS — E119 Type 2 diabetes mellitus without complications: Secondary | ICD-10-CM

## 2022-06-28 DIAGNOSIS — E538 Deficiency of other specified B group vitamins: Secondary | ICD-10-CM | POA: Diagnosis not present

## 2022-06-28 DIAGNOSIS — E785 Hyperlipidemia, unspecified: Secondary | ICD-10-CM

## 2022-06-28 DIAGNOSIS — Z125 Encounter for screening for malignant neoplasm of prostate: Secondary | ICD-10-CM | POA: Diagnosis not present

## 2022-06-28 LAB — LIPID PANEL
Cholesterol: 143 mg/dL (ref 0–200)
HDL: 42 mg/dL (ref >39.00)
NonHDL: 101.01
Total CHOL/HDL Ratio: 3
Triglycerides: 276 mg/dL — ABNORMAL HIGH (ref 0.0–149.0)
VLDL: 55.2 mg/dL — ABNORMAL HIGH (ref 0.0–40.0)

## 2022-06-28 LAB — CBC WITH DIFFERENTIAL/PLATELET
Basophils Absolute: 0.1 10*3/uL (ref 0.0–0.1)
Basophils Relative: 1.1 % (ref 0.0–3.0)
Eosinophils Absolute: 0.1 10*3/uL (ref 0.0–0.7)
Eosinophils Relative: 1.6 % (ref 0.0–5.0)
HCT: 45.4 % (ref 39.0–52.0)
Hemoglobin: 15.6 g/dL (ref 13.0–17.0)
Lymphocytes Relative: 17.3 % (ref 12.0–46.0)
Lymphs Abs: 1.4 10*3/uL (ref 0.7–4.0)
MCHC: 34.5 g/dL (ref 30.0–36.0)
MCV: 93.3 fl (ref 78.0–100.0)
Monocytes Absolute: 0.9 10*3/uL (ref 0.1–1.0)
Monocytes Relative: 10.8 % (ref 3.0–12.0)
Neutro Abs: 5.8 10*3/uL (ref 1.4–7.7)
Neutrophils Relative %: 69.2 % (ref 43.0–77.0)
Platelets: 160 10*3/uL (ref 150.0–400.0)
RBC: 4.86 Mil/uL (ref 4.22–5.81)
RDW: 13.9 % (ref 11.5–15.5)
WBC: 8.3 10*3/uL (ref 4.0–10.5)

## 2022-06-28 LAB — URINALYSIS, ROUTINE W REFLEX MICROSCOPIC
Bilirubin Urine: NEGATIVE
Nitrite: NEGATIVE
Specific Gravity, Urine: 1.025 (ref 1.000–1.030)
Total Protein, Urine: 30 — AB
Urine Glucose: NEGATIVE
Urobilinogen, UA: 1 (ref 0.0–1.0)
pH: 6 (ref 5.0–8.0)

## 2022-06-28 LAB — BASIC METABOLIC PANEL
BUN: 19 mg/dL (ref 6–23)
CO2: 28 mEq/L (ref 19–32)
Calcium: 9.7 mg/dL (ref 8.4–10.5)
Chloride: 104 mEq/L (ref 96–112)
Creatinine, Ser: 0.96 mg/dL (ref 0.40–1.50)
GFR: 75.18 mL/min (ref >60.00)
Glucose, Bld: 141 mg/dL — ABNORMAL HIGH (ref 70–99)
Potassium: 5 mEq/L (ref 3.5–5.1)
Sodium: 140 mEq/L (ref 135–145)

## 2022-06-28 LAB — VITAMIN D 25 HYDROXY (VIT D DEFICIENCY, FRACTURES): VITD: 29.15 ng/mL — ABNORMAL LOW (ref 30.00–100.00)

## 2022-06-28 LAB — HEMOGLOBIN A1C: Hgb A1c MFr Bld: 6.9 % — ABNORMAL HIGH (ref 4.6–6.5)

## 2022-06-28 LAB — MICROALBUMIN / CREATININE URINE RATIO
Creatinine,U: 227.6 mg/dL
Microalb Creat Ratio: 6.4 mg/g (ref 0.0–30.0)
Microalb, Ur: 14.7 mg/dL — ABNORMAL HIGH (ref 0.0–1.9)

## 2022-06-28 LAB — HEPATIC FUNCTION PANEL
ALT: 24 U/L (ref 0–53)
AST: 18 U/L (ref 0–37)
Albumin: 4.2 g/dL (ref 3.5–5.2)
Alkaline Phosphatase: 57 U/L (ref 39–117)
Bilirubin, Direct: 0.2 mg/dL (ref 0.0–0.3)
Total Bilirubin: 1.3 mg/dL — ABNORMAL HIGH (ref 0.2–1.2)
Total Protein: 6.4 g/dL (ref 6.0–8.3)

## 2022-06-28 LAB — TSH: TSH: 4.44 u[IU]/mL (ref 0.35–5.50)

## 2022-06-28 LAB — PSA: PSA: 1.95 ng/mL (ref 0.10–4.00)

## 2022-06-28 LAB — VITAMIN B12: Vitamin B-12: 415 pg/mL (ref 211–911)

## 2022-06-28 LAB — LDL CHOLESTEROL, DIRECT: Direct LDL: 77 mg/dL

## 2022-06-28 MED ORDER — FAMOTIDINE 40 MG PO TABS: 40.0000 mg | ORAL_TABLET | Freq: Every day | ORAL | 3 refills | Status: DC

## 2022-06-28 NOTE — Progress Notes (Signed)
Patient ID: Troy Banks, male   DOB: March 02, 1941, 82 y.o.   MRN: [id number]         Chief Complaint:: wellness exam and dm, hld, htn, low Vit D, skin lesion       HPI:  Troy Banks is a 82 y.o. male here for wellness exam, for shignrix, flu shot, and covid booster at the pharmacy, declines colonoscopy o/w up to date                    Also has 2 mo enlarging non healing skin lesion to the left temple area, wondering about skin cancer,  BP has been controlled at home.  Pt denies chest pain, increased sob or doe, wheezing, orthopnea, PND, increased LE swelling, palpitations, dizziness or syncope.   Pt denies polydipsia, polyuria, or new focal neuro s/s.    Pt denies fever, wt loss, night sweats, loss of appetite, or other constitutional symptoms     Wt Readings from Last 3 Encounters:  06/28/22 210 lb (95.3 kg)  05/19/22 211 lb 12.8 oz (96.1 kg)  05/06/22 207 lb 6 oz (94.1 kg)   BP Readings from Last 3 Encounters:  06/28/22 (!) 152/82  05/19/22 (!) 142/78  05/06/22 (!) 154/81   Immunization History  Administered Date(s) Administered   DT (Pediatric) 06/06/2008   PFIZER(Purple Top)SARS-COV-2 Vaccination 02/17/2020, 03/09/2020, 08/10/2020   Pneumococcal Conjugate-13 09/08/2014, 10/04/2017   Pneumococcal Polysaccharide-23 10/27/2017   Pneumococcal-Unspecified 02/20/2008   Td 06/06/2008   Tdap 11/21/2012, 11/02/2018   There are no preventive care reminders to display for this patient.     Past Medical History:  Diagnosis Date   Allergy    Anxiety    PTSD   Aortic stenosis, moderate 03/27/2015   post AVR echo Echo 11/16: Mild LVH, EF 55-60%, normal wall motion, grade 1 diastolic dysfunction, bioprosthetic AVR okay (mean gradient 18 mmHg) with trivial AI, mild LAE, atrial septal aneurysm, small effusion    Arthritis    "qwhere" (10/29/2015)   Cancer ([place])    skin cancer   Carotid artery stenosis    Carotid US 11/17: 1-39% bilateral ICA stenosis. F/u prn   Cervical  stenosis of spinal canal    fusion 2006   CHF (congestive heart failure) ([place])    "secondry to [facility]"   Closed left pilon fracture    COLONIC POLYPS, HX OF    Complication of anesthesia    "takes a lot to put him under and has woken up during surgery" Difficult to wake up . PTSD; "does not metabolize RX well"that he gets "violent", per pt.    Coronary artery disease    DIVERTICULOSIS, COLON    Dysrhythmia    2 bouts of afib post op and at home but corrected with medication.   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)    confirmed heterozygous 10/24/14 labs   PTSD (post-traumatic stress disorder)    PTSD (post-traumatic stress disorder)    Sleep apnea    On a CPAP   Past Surgical History:  Procedure Laterality Date   ANKLE FUSION Left 03/17/2017   Procedure: FUSION  PILON FRACTURE WITH BONE GRAFTING;  Surgeon: Shona Needles, MD;  Location: [place];  Service: Orthopedics;  Laterality: Left;   ANTERIOR FUSION CERVICAL SPINE  2002   AORTIC VALVE REPLACEMENT N/A 03/30/2015   Procedure: AORTIC VALVE REPLACEMENT (AVR);  Surgeon: Grace Isaac, MD;  Location: [place];  Service: Open Heart Surgery;  Laterality: N/A;   CARDIAC CATHETERIZATION N/A 03/26/2015   Procedure: Left Heart Cath and Coronary Angiography;  Surgeon: Leonie Man, MD;  Location: [place] [facility];  Service: Cardiovascular;  Laterality: N/A;   CARDIAC VALVE REPLACEMENT      COLONOSCOPY W/ POLYPECTOMY     CORONARY ARTERY BYPASS GRAFT N/A 03/30/2015   Procedure: CORONARY ARTERY BYPASS GRAFTING (CABG) x four, using left internal mammary artery and left    leg greater saphenous vein harvested endoscopically ;  Surgeon: Grace Isaac, MD;  Location: [place];  Service: Open Heart Surgery;  Laterality: N/A;   CORONARY/GRAFT ANGIOGRAPHY N/A 12/03/2021   Procedure: CORONARY/GRAFT ANGIOGRAPHY;  Surgeon: [name], [name], MD;  Location: [place] [facility];  Service: Cardiovascular;  Laterality: N/A;   CYSTOSCOPY/URETEROSCOPY/HOLMIUM LASER/STENT PLACEMENT Bilateral 11/07/2016   Procedure: CYSTOSCOPY/URETEROSCOPY/HOLMIUM LASER/STENT PLACEMENT;  Surgeon: Nickie Retort, MD;  Location: [facility];  Service: Urology;  Laterality: Bilateral;   CYSTOSCOPY/URETEROSCOPY/HOLMIUM LASER/STENT PLACEMENT Bilateral 11/23/2016   Procedure: CYSTOSCOPY/BILATERAL URETEROSCOPY/HOLMIUM LASER/STENT EXCHANGE;  Surgeon: Nickie Retort, MD;  Location: [facility];  Service: Urology;  Laterality: Bilateral;  NEEDS 90 MIN    EXTERNAL FIXATION LEG Left 02/22/2017   Procedure: EXTERNAL FIXATION LEFT ANKLE;  Surgeon: Renette Butters, MD;  Location: [place];  Service: Orthopedics;  Laterality: Left;   HEMORRHOID BANDING  1970s   INGUINAL HERNIA REPAIR Left [place]   "cut me open"   LEFT HEART CATH AND CORS/GRAFTS ANGIOGRAPHY N/A 10/25/2018   Procedure: LEFT HEART CATH AND CORS/GRAFTS ANGIOGRAPHY;  Surgeon: Leonie Man, MD;  Location: [place] [facility];  Service: Cardiovascular;  Laterality: N/A;   LEFT HEART CATH AND CORS/GRAFTS ANGIOGRAPHY N/A 12/03/2021   Procedure: LEFT HEART CATH AND CORS/GRAFTS ANGIOGRAPHY;  Surgeon: [name], [name], MD;  Location: [place] [facility];  Service: Cardiovascular;  Laterality: N/A;   POSTERIOR FUSION CERVICAL SPINE  2006   TEE WITHOUT CARDIOVERSION N/A 03/30/2015   Procedure: TRANSESOPHAGEAL ECHOCARDIOGRAM (TEE);  Surgeon: Grace Isaac,  MD;  Location: [place];  Service: Open Heart Surgery;  Laterality: N/A;   TESTICLE TORSION REDUCTION Right 1960   TOE FUSION Left 1985 & 2004   "great toe" and removal   TONSILLECTOMY     TOTAL HIP ARTHROPLASTY Right 10/27/2015   Procedure: RIGHT TOTAL HIP ARTHROPLASTY ANTERIOR APPROACH and steroid injection right foot;  Surgeon: Mcarthur Rossetti, MD;  Location: [place];  Service: Orthopedics;  Laterality: Right;   TOTAL HIP ARTHROPLASTY Left 08/13/2019   TOTAL HIP ARTHROPLASTY Left 08/13/2019   Procedure: LEFT TOTAL HIP ARTHROPLASTY ANTERIOR APPROACH;  Surgeon: Mcarthur Rossetti, MD;  Location: [place];  Service: Orthopedics;  Laterality: Left;    reports that he has been smoking cigarettes. He has a  34.00 pack-year smoking history. He has never used smokeless tobacco. He reports current alcohol use of about 3.0 standard drinks of alcohol per week. He reports that he does not use drugs. family history includes Alcohol abuse in an other family member; Arthritis in an other family member; Diabetes in his mother and paternal grandfather; Drug abuse in his sister; Healthy in his brother, daughter, and son; Heart disease in his father, maternal grandfather, maternal grandmother, mother, and paternal grandfather; Hypertension in his father, maternal grandfather, maternal grandmother, mother, and paternal grandfather; Prostate cancer in his maternal uncle; Tremor in his sister. Allergies  Allergen Reactions   Acyclovir And Related Other (See Comments)    Accumulates and causes stroke-like side-effects due to cytochrome P450 enzyme deficiency   Benzodiazepines Other (See Comments)    Patient states he gets stroke symptoms with benzo's.    Compazine [Prochlorperazine Maleate] Other (See Comments)    Accumulates and causes stroke-like side-effects due to cytochrome P450 enzyme deficiency   Coreg [Carvedilol] Other (See Comments)    Accumulates and causes stroke-like side-effects due to cytochrome  P450 enzyme deficiency Patient poor metabolizer of CYP2D6 - Coreg undergoes extensive hepatic (including 2D6)   Flomax [Tamsulosin Hcl] Other (See Comments)    Accumulates and causes stroke-like side-effects due to cytochrome P450 enzyme deficiency   Lisinopril Other (See Comments)    Hyperkalemia. 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   Mobic [Meloxicam] Other (See Comments)    Accumulates and causes stroke-like side-effects due to cytochrome P450 enzyme deficiency   Morphine And Related Other (See Comments)    Per pt's wife morphine also contraindicated due to p450 enzyme deficiency.     Ondansetron Other (See Comments)    Accumulates and causes stroke-like side-effects due to cytochrome P450 enzyme deficiency    Oxycodone Other (See Comments)    Accumulates and causes stroke-like side-effects due to cytochrome P450 enzyme deficiency   Promethazine Other (See Comments)    Accumulates and causes stroke-like side-effects due to cytochrome P450 enzyme deficiency   Sertraline Hcl Other (See Comments)    Accumulates and causes stroke-like side-effects due to cytochrome P450 enzyme deficiency   Tramadol Other (See Comments)    Accumulates and causes stroke-like side-effects due to cytochrome P450 enzyme deficiency   Atorvastatin Other (See Comments)    MYALGIAS   Colchicine Other (See Comments)    ataxia   Fentanyl Other (See Comments)    Accumulates and cause stroke like symptoms   Current Outpatient Medications on File Prior to Visit  Medication Sig Dispense Refill   acetaminophen (TYLENOL) 500 MG tablet Take 1,000 mg by mouth every 6 (six) hours as needed for moderate pain.     allopurinol (ZYLOPRIM) 300 MG tablet TAKE 1 TABLET BY MOUTH EVERY DAY 90 tablet 3   amLODipine (NORVASC) 10 MG tablet TAKE 1 TABLET BY MOUTH EVERY DAY (Patient taking differently: Take 10 mg by mouth every evening.) 90 tablet 3   Ascorbic Acid (VITAMIN C)  1000 MG tablet Take 1,000 mg by mouth daily.     aspirin EC 81 MG tablet Take 81 mg by mouth every other day. Swallow whole.     calcium carbonate (TUMS EX) 750 MG chewable tablet Chew 1 tablet by mouth daily as needed for heartburn.      Carboxymethylcellulose Sodium (LUBRICANT EYE DROPS OP) Place 1 drop into both eyes 2 (two) times daily as needed (dry/irritated eyes.).      Cholecalciferol (VITAMIN D-3) 25  MCG (1000 UT) CAPS Take 1,000 Units by mouth every other day.     furosemide (LASIX) 40 MG tablet TAKE 1 TABLET BY MOUTH EVERY OTHER DAY 45 tablet 2   gabapentin (NEURONTIN) 300 MG capsule Take 1 capsule (300 mg total) by mouth 2 (two) times daily. 180 capsule 2   isosorbide mononitrate (IMDUR) 30 MG 24 hr tablet Take 1 tablet (30 mg total) by mouth daily. 30 tablet 11   levothyroxine (SYNTHROID) 25 MCG tablet TAKE 1 TABLET BY MOUTH EVERY DAY WITH BREAKFAST (Patient taking differently: Take 25 mcg by mouth daily before breakfast.) 90 tablet 3   nitroGLYCERIN (NITROSTAT) 0.4 MG SL tablet PLACE 1 TABLET UNDER THE TONGUE EVERY 5 MINUTES AS NEEDED FOR CHEST PAIN. (Patient taking differently: Place 0.4 mg under the tongue every 5 (five) minutes as needed for chest pain.) 25 tablet 3   primidone (MYSOLINE) 50 MG tablet TAKE 1 TABLET BY MOUTH 2 TIMES DAILY 180 tablet 0   rosuvastatin (CRESTOR) 10 MG tablet TAKE 1/2 TABLET BY MOUTH EVERY DAY (Patient taking differently: Take 5 mg by mouth every evening.) 45 tablet 3   tiZANidine (ZANAFLEX) 4 MG tablet Take 2 mg by mouth every 8 (eight) hours as needed for muscle spasms.     Turmeric 500 MG CAPS Take 500 mg by mouth daily.      vitamin B-12 (CYANOCOBALAMIN) 1000 MCG tablet Take 1,000 mcg by mouth every other day.     [DISCONTINUED] ranitidine (ZANTAC) 150 MG tablet TAKE 1 TABLET BY MOUTH 2 TIMES DAILY (Patient taking differently: No sig reported) 180 tablet 0   No current facility-administered medications on file prior to visit.        ROS:  All  others reviewed and negative.  Objective        PE:  BP (!) 152/82 (BP Location: Left Arm, Patient Position: Sitting, Cuff Size: Normal)   Pulse 64   Temp 98.4 F (36.9 C) (Oral)   Ht '5\' 8"'$  (1.727 m)   Wt 210 lb (95.3 kg)   SpO2 96%   BMI 31.93 kg/m                 Constitutional: Pt appears in NAD               HENT: Head: NCAT.                Right Ear: External ear normal.                 Left Ear: External ear normal.                Eyes: . Pupils are equal, round, and reactive to light. Conjunctivae and EOM are normal               Nose: without d/c or deformity               Neck: Neck supple. Gross normal ROM               Cardiovascular: Normal rate and regular rhythm.                 Pulmonary/Chest: Effort normal and breath sounds without rales or wheezing.                Abd:  Soft, NT, ND, + BS, no organomegaly               Neurological: Pt is alert. At baseline orientation, motor  grossly intact               Skin: Skin is warm. No rashes, no other new lesions, LE edema - none               Psychiatric: Pt behavior is normal without agitation   Micro: none  Cardiac tracings I have personally interpreted today:  none  Pertinent Radiological findings (summarize): none   Lab Results  Component Value Date   WBC 8.3 06/28/2022   HGB 15.6 06/28/2022   HCT 45.4 06/28/2022   PLT 160.0 06/28/2022   GLUCOSE 141 (H) 06/28/2022   CHOL 143 06/28/2022   TRIG 276.0 (H) 06/28/2022   HDL 42.00 06/28/2022   LDLDIRECT 77.0 06/28/2022   LDLCALC 48 12/03/2021   ALT 24 06/28/2022   AST 18 06/28/2022   NA 140 06/28/2022   K 5.0 06/28/2022   CL 104 06/28/2022   CREATININE 0.96 06/28/2022   BUN 19 06/28/2022   CO2 28 06/28/2022   TSH 4.44 06/28/2022   PSA 1.95 06/28/2022   INR 1.1 12/02/2021   HGBA1C 6.9 (H) 06/28/2022   MICROALBUR 14.7 (H) 06/28/2022   Assessment/Plan:  Troy Banks is a 82 y.o. White or Caucasian [1] male with  has a past medical history of  Allergy, Anxiety, Aortic stenosis, moderate (03/27/2015), Arthritis, Cancer ([place]), Carotid artery stenosis, Cervical stenosis of spinal canal, CHF (congestive heart failure) ([place]), Closed left pilon fracture, COLONIC POLYPS, HX OF, Complication of anesthesia, Coronary artery disease, DIVERTICULOSIS, COLON, Dysrhythmia, Familial tremor (03/19/2014), Family history of adverse reaction to anesthesia, GERD (gastroesophageal reflux disease), GOUT, Headache, Heart murmur, High cholesterol, History of blood transfusion, History of kidney stones, HYPERTENSION, Hypothyroidism, Myocardial infarction ([place]), OSA on CPAP, Pneumonia, 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  Encounter for well adult exam with abnormal findings Age and sex appropriate education and counseling updated with regular exercise and diet Referrals for preventative services - none needed Immunizations addressed - for shingrix, flu and covid booster at the pharmacy Smoking counseling  - none needed Evidence for depression or other mood disorder - none significant Most recent labs reviewed. I have personally reviewed and have noted: 1) the patient's medical and social history 2) The patient's current medications and supplements 3) The patient's height, weight, and BMI have been recorded in the chart   Diet-controlled diabetes mellitus ([place]) Lab Results  Component Value Date   HGBA1C 6.9 (H) 06/28/2022    Uncontrolled, pt to continue current diet, declines oha for now   Dyslipidemia Lab Results  Component Value Date   LDLCALC 48 12/03/2021   Stable, pt to continue current statin crestof 40 mg qd   Essential hypertension BP Readings from Last 3 Encounters:  06/28/22 (!) 152/82  05/19/22 (!) 142/78  05/06/22 (!) 154/81   Uncontrolled here, but pt adamant BP controlled at home,, pt to continue medical  treatment norvasc 10 qd as declines change   Vitamin D deficiency Last vitamin D Lab Results  Component Value Date   VD25OH 29.15 (L) 06/28/2022   Low, to start oral replacement   Skin lesion New raised tan lesion, for referral derm  Followup: Return in about 6 months (around 12/27/2022).  Cathlean Cower, MD 06/29/2022 9:06 PM [facility]

## 2022-06-28 NOTE — Patient Instructions (Addendum)
Please have your Shingrix (shingles) shots done at your local pharmacy.  Please continue all other medications as before, and refills have been done if requested.  Please have the pharmacy call with any other refills you may need.  Please continue your efforts at being more active, low cholesterol diet, and weight control.  You are otherwise up to date with prevention measures today.  Please keep your appointments with your specialists as you may have planned  You will be contacted regarding the referral for: dermatology  Please go to the LAB at the blood drawing area for the tests to be done  You will be contacted by phone if any changes need to be made immediately.  Otherwise, you will receive a letter about your results with an explanation, but please check with MyChart first.  Please remember to sign up for MyChart if you have not done so, as this will be important to you in the future with finding out test results, communicating by private email, and scheduling acute appointments online when needed.  Please make an Appointment to return in 6 months, or sooner if needed

## 2022-06-29 ENCOUNTER — Encounter: Payer: Self-pay | Admitting: Internal Medicine

## 2022-06-29 NOTE — Assessment & Plan Note (Signed)
Lab Results  Component Value Date   LDLCALC 48 12/03/2021   Stable, pt to continue current statin crestof 40 mg qd

## 2022-06-29 NOTE — Assessment & Plan Note (Signed)
New raised tan lesion, for referral derm

## 2022-06-29 NOTE — Assessment & Plan Note (Signed)
Last vitamin D Lab Results  Component Value Date   VD25OH 29.15 (L) 06/28/2022   Low, to start oral replacement

## 2022-06-29 NOTE — Assessment & Plan Note (Signed)
Age and sex appropriate education and counseling updated with regular exercise and diet Referrals for preventative services - none needed Immunizations addressed - for shingrix, flu and covid booster at the pharmacy Smoking counseling  - none needed Evidence for depression or other mood disorder - none significant Most recent labs reviewed. I have personally reviewed and have noted: 1) the patient's medical and social history 2) The patient's current medications and supplements 3) The patient's height, weight, and BMI have been recorded in the chart

## 2022-06-29 NOTE — Assessment & Plan Note (Signed)
Lab Results  Component Value Date   HGBA1C 6.9 (H) 06/28/2022    Uncontrolled, pt to continue current diet, declines oha for now

## 2022-06-29 NOTE — Assessment & Plan Note (Signed)
BP Readings from Last 3 Encounters:  06/28/22 (!) 152/82  05/19/22 (!) 142/78  05/06/22 (!) 154/81   Uncontrolled here, but pt adamant BP controlled at home,, pt to continue medical treatment norvasc 10 qd as declines change

## 2022-07-13 DIAGNOSIS — N200 Calculus of kidney: Secondary | ICD-10-CM | POA: Diagnosis not present

## 2022-07-13 DIAGNOSIS — R311 Benign essential microscopic hematuria: Secondary | ICD-10-CM | POA: Diagnosis not present

## 2022-08-04 ENCOUNTER — Other Ambulatory Visit: Payer: Self-pay | Admitting: Cardiology

## 2022-08-20 ENCOUNTER — Other Ambulatory Visit: Payer: Self-pay | Admitting: Neurology

## 2022-08-20 DIAGNOSIS — G43709 Chronic migraine without aura, not intractable, without status migrainosus: Secondary | ICD-10-CM

## 2022-08-20 DIAGNOSIS — G250 Essential tremor: Secondary | ICD-10-CM

## 2022-08-25 ENCOUNTER — Telehealth: Payer: Self-pay

## 2022-08-25 NOTE — Telephone Encounter (Signed)
Contacted Sallee Lange Hoots to schedule their annual wellness visit. Appointment made for 09/01/22.  Norton Blizzard, [place] ([facility])  [facility] [phone]

## 2022-08-29 ENCOUNTER — Encounter: Payer: Self-pay | Admitting: Neurology

## 2022-09-01 ENCOUNTER — Ambulatory Visit (INDEPENDENT_AMBULATORY_CARE_PROVIDER_SITE_OTHER): Payer: Medicare Other

## 2022-09-01 VITALS — Ht 68.0 in | Wt 210.0 lb

## 2022-09-01 DIAGNOSIS — Z0000 Encounter for general adult medical examination without abnormal findings: Secondary | ICD-10-CM

## 2022-09-01 NOTE — Progress Notes (Signed)
I connected with  Troy Banks on 09/01/22 by a audio enabled telemedicine application and verified that I am speaking with the correct person using two identifiers.  Patient Location: Home  Provider Location: Office/Clinic  I discussed the limitations of evaluation and management by telemedicine. The patient expressed understanding and agreed to proceed.  Patient Medicare AWV questionnaire was completed by the patient on 09/01/2022; I have confirmed that all information answered by patient is correct and no changes since this date.    Subjective:   Troy Banks is a 82 y.o. male who presents for Medicare Annual/Subsequent preventive examination.  Review of Systems     Cardiac Risk Factors include: advanced age (>11men, >9 women);dyslipidemia;family history of premature cardiovascular disease;hypertension;male gender;obesity (BMI >30kg/m2);sedentary lifestyle;smoking/ tobacco exposure     Objective:    Today's Vitals   09/01/22 1529 09/01/22 1530  Weight: 210 lb (95.3 kg)   Height: 5\' 8"  (1.727 m)   PainSc: 0-No pain 0-No pain   Body mass index is 31.93 kg/m.     09/01/2022    3:34 PM 12/02/2021    3:50 PM 11/11/2021    9:01 AM 08/30/2021    2:02 PM 05/12/2021    9:37 AM 05/10/2021    8:36 AM 11/30/2020    2:23 PM  Advanced Directives  Does Patient Have a Medical Advance Directive? Yes No Yes Yes Yes Yes Yes  Type of Paramedic of [place];Living will;Out of facility DNR (pink MOST or yellow form)  Living will;Out of facility DNR (pink MOST or yellow form) [place];Living will [place];Living will Living will [place];Living will;Out of facility DNR (pink MOST or yellow form)  Does patient want to make changes to medical advance directive?     No - Patient declined  No - Patient declined  Copy of [place] in Chart? No - copy requested   No - copy requested     Would  patient like information on creating a medical advance directive?  No - Patient declined         Current Medications (verified) Outpatient Encounter Medications as of 09/01/2022  Medication Sig   acetaminophen (TYLENOL) 500 MG tablet Take 1,000 mg by mouth every 6 (six) hours as needed for moderate pain.   allopurinol (ZYLOPRIM) 300 MG tablet TAKE 1 TABLET BY MOUTH EVERY DAY   amLODipine (NORVASC) 10 MG tablet TAKE 1 TABLET BY MOUTH EVERY DAY   Ascorbic Acid (VITAMIN C) 1000 MG tablet Take 1,000 mg by mouth daily.   aspirin EC 81 MG tablet Take 81 mg by mouth every other day. Swallow whole.   calcium carbonate (TUMS EX) 750 MG chewable tablet Chew 1 tablet by mouth daily as needed for heartburn.    Carboxymethylcellulose Sodium (LUBRICANT EYE DROPS OP) Place 1 drop into both eyes 2 (two) times daily as needed (dry/irritated eyes.).    Cholecalciferol (VITAMIN D-3) 25 MCG (1000 UT) CAPS Take 1,000 Units by mouth every other day.   famotidine (PEPCID) 40 MG tablet Take 1 tablet (40 mg total) by mouth daily.   furosemide (LASIX) 40 MG tablet TAKE 1 TABLET BY MOUTH EVERY OTHER DAY   isosorbide mononitrate (IMDUR) 30 MG 24 hr tablet Take 1 tablet (30 mg total) by mouth daily.   levothyroxine (SYNTHROID) 25 MCG tablet TAKE 1 TABLET BY MOUTH EVERY DAY WITH BREAKFAST (Patient taking differently: Take 25 mcg by mouth daily before breakfast.)  nitroGLYCERIN (NITROSTAT) 0.4 MG SL tablet PLACE 1 TABLET UNDER THE TONGUE EVERY 5 MINUTES AS NEEDED FOR CHEST PAIN. (Patient taking differently: Place 0.4 mg under the tongue every 5 (five) minutes as needed for chest pain.)   primidone (MYSOLINE) 50 MG tablet TAKE 1 TABLET BY MOUTH 2 TIMES DAILY   rosuvastatin (CRESTOR) 10 MG tablet TAKE 1/2 TABLET BY MOUTH EVERY DAY (Patient taking differently: Take 5 mg by mouth every evening.)   tiZANidine (ZANAFLEX) 4 MG tablet Take 2 mg by mouth every 8 (eight) hours as needed for muscle spasms.   Turmeric 500 MG CAPS Take  500 mg by mouth daily.    vitamin B-12 (CYANOCOBALAMIN) 1000 MCG tablet Take 1,000 mcg by mouth every other day.   [DISCONTINUED] gabapentin (NEURONTIN) 300 MG capsule Take 1 capsule (300 mg total) by mouth 2 (two) times daily.   [DISCONTINUED] ranitidine (ZANTAC) 150 MG tablet TAKE 1 TABLET BY MOUTH 2 TIMES DAILY (Patient taking differently: No sig reported)   No facility-administered encounter medications on file as of 09/01/2022.    Allergies (verified) Acyclovir and related, Benzodiazepines, Compazine [prochlorperazine maleate], Coreg [carvedilol], Flomax [tamsulosin hcl], Lisinopril, Mobic [meloxicam], Morphine and related, Ondansetron, Oxycodone, Promethazine, Sertraline hcl, Tramadol, Atorvastatin, Colchicine, and Fentanyl   History: Past Medical History:  Diagnosis Date   Allergy    Anxiety    PTSD   Aortic stenosis, moderate 03/27/2015   post AVR echo Echo 11/16: Mild LVH, EF 55-60%, normal wall motion, grade 1 diastolic dysfunction, bioprosthetic AVR okay (mean gradient 18 mmHg) with trivial AI, mild LAE, atrial septal aneurysm, small effusion    Arthritis    "qwhere" (10/29/2015)   Cancer (HCC)    skin cancer   Carotid artery stenosis    Carotid US 11/17: 1-39% bilateral ICA stenosis. F/u prn   Cervical stenosis of spinal canal    fusion 2006   CHF (congestive heart failure) ([place])    "secondry to [facility]"   Closed left pilon fracture    COLONIC POLYPS, HX OF    Complication of anesthesia    "takes a lot to put him under and has woken up during surgery" Difficult to wake up . PTSD; "does not metabolize RX well"that he gets "violent", per pt.    Coronary artery disease    DIVERTICULOSIS, COLON    Dysrhythmia    2 bouts of afib post op and at home but corrected with medication.   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)    confirmed heterozygous 10/24/14 labs   PTSD (post-traumatic stress disorder)    PTSD (post-traumatic stress disorder)    Sleep apnea    On a CPAP   Past Surgical History:  Procedure Laterality Date   ANKLE FUSION Left 03/17/2017   Procedure:  FUSION  PILON FRACTURE WITH BONE GRAFTING;  Surgeon: Shona Needles, MD;  Location: [place];  Service: Orthopedics;  Laterality: Left;   ANTERIOR FUSION CERVICAL SPINE  2002   AORTIC VALVE REPLACEMENT N/A 03/30/2015   Procedure: AORTIC VALVE REPLACEMENT (AVR);  Surgeon: Grace Isaac, MD;  Location: [place];  Service: Open Heart Surgery;  Laterality: N/A;   CARDIAC CATHETERIZATION N/A 03/26/2015   Procedure: Left Heart Cath and Coronary Angiography;  Surgeon: Leonie Man, MD;  Location: [place] [facility];  Service: Cardiovascular;  Laterality: N/A;   CARDIAC VALVE REPLACEMENT     COLONOSCOPY W/ POLYPECTOMY     CORONARY ARTERY BYPASS GRAFT N/A 03/30/2015   Procedure: CORONARY ARTERY BYPASS GRAFTING (CABG) x four, using left internal mammary artery and left    leg greater saphenous vein harvested endoscopically ;  Surgeon: Grace Isaac, MD;  Location: [place];  Service: Open Heart Surgery;  Laterality: N/A;   CORONARY/GRAFT ANGIOGRAPHY N/A 12/03/2021   Procedure: CORONARY/GRAFT ANGIOGRAPHY;  Surgeon: [name], [name], MD;  Location: [place] [facility];  Service: Cardiovascular;  Laterality: N/A;   CYSTOSCOPY/URETEROSCOPY/HOLMIUM LASER/STENT PLACEMENT Bilateral 11/07/2016   Procedure: CYSTOSCOPY/URETEROSCOPY/HOLMIUM LASER/STENT PLACEMENT;  Surgeon:  Nickie Retort, MD;  Location[facility];  Service: Urology;  Laterality: Bilateral;   CYSTOSCOPY/URETEROSCOPY/HOLMIUM LASER/STENT PLACEMENT Bilateral 11/23/2016   Procedure: CYSTOSCOPY/BILATERAL URETEROSCOPY/HOLMIUM LASER/STENT EXCHANGE;  Surgeon: Nickie Retort, MD;  Location: [facility];  Service: Urology;  Laterality: Bilateral;  NEEDS 90 MIN    EXTERNAL FIXATION LEG Left 02/22/2017   Procedure: EXTERNAL FIXATION LEFT ANKLE;  Surgeon: Renette Butters, MD;  Location: [place];  Service: Orthopedics;  Laterality: Left;   HEMORRHOID BANDING  1970s   INGUINAL HERNIA REPAIR Left [place]   "cut me open"   LEFT HEART CATH AND CORS/GRAFTS ANGIOGRAPHY N/A 10/25/2018   Procedure: LEFT HEART CATH AND CORS/GRAFTS ANGIOGRAPHY;  Surgeon: Leonie Man, MD;  Location: [place] [facility];  Service: Cardiovascular;  Laterality: N/A;   LEFT HEART CATH AND CORS/GRAFTS ANGIOGRAPHY N/A 12/03/2021   Procedure: LEFT HEART CATH AND CORS/GRAFTS ANGIOGRAPHY;  Surgeon: [name], [name], MD;  Location: [place] [facility];  Service: Cardiovascular;  Laterality: N/A;   POSTERIOR FUSION CERVICAL SPINE  2006   TEE WITHOUT CARDIOVERSION N/A 03/30/2015   Procedure: TRANSESOPHAGEAL ECHOCARDIOGRAM (TEE);  Surgeon: Grace Isaac, MD;  Location: [place];  Service: Open Heart Surgery;  Laterality: N/A;   TESTICLE TORSION REDUCTION Right 1960   TOE FUSION Left 1985 & 2004   "great toe" and removal   TONSILLECTOMY     TOTAL HIP ARTHROPLASTY Right 10/27/2015   Procedure: RIGHT TOTAL HIP ARTHROPLASTY ANTERIOR APPROACH and steroid injection right foot;  Surgeon: Mcarthur Rossetti, MD;  Location: [place];  Service: Orthopedics;  Laterality: Right;   TOTAL HIP ARTHROPLASTY Left 08/13/2019   TOTAL HIP ARTHROPLASTY Left 08/13/2019   Procedure: LEFT TOTAL HIP ARTHROPLASTY ANTERIOR APPROACH;  Surgeon: Mcarthur Rossetti, MD;  Location: [place];  Service: Orthopedics;  Laterality: Left;   Family History   Problem Relation Age of Onset   Heart disease Mother    Hypertension Mother    Diabetes Mother    Heart disease Father    Hypertension Father    Tremor Sister    Healthy Brother    Healthy Son    Prostate cancer Maternal Uncle    Hypertension Maternal Grandmother    Heart disease Maternal Grandmother  Hypertension Maternal Grandfather    Heart disease Maternal Grandfather    Hypertension Paternal Grandfather    Heart disease Paternal Grandfather    Diabetes Paternal Grandfather    Alcohol abuse Other    Arthritis Other    Drug abuse Sister    Healthy Daughter    Colon polyps Neg Hx    Esophageal cancer Neg Hx    Rectal cancer Neg Hx    Stomach cancer Neg Hx    Colon cancer Neg Hx    Social History   Socioeconomic History   Marital status: Married    Spouse name: Karmen Bongo, RN   Number of children: 2   Years of education: Not on file   Highest education level: Some college, no degree  Occupational History   Occupation: Retired Animal nutritionist  Tobacco Use   Smoking status: Some Days    Packs/day: 1.00    Years: 34.00    Additional pack years: 0.00    Total pack years: 34.00    Types: Cigarettes    Last attempt to quit: 06/06/1990    Years since quitting: 32.2   Smokeless tobacco: Never  Vaping Use   Vaping Use: Never used  Substance and Sexual Activity   Alcohol use: Yes    Alcohol/week: 3.0 standard drinks of alcohol    Types: 1 Cans of beer, 2 Shots of liquor per week    Comment: occassional   Drug use: No   Sexual activity: Yes  Other Topics Concern   Not on file  Social History Narrative   Left Handed    One Story home    Social Determinants of Health   Financial Resource Strain: Low Risk  (09/01/2022)   Overall Financial Resource Strain (CARDIA)    Difficulty of Paying Living Expenses: Not hard at all  Food Insecurity: No Food Insecurity (09/01/2022)   Hunger Vital Sign    Worried About Running Out of Food in the Last Year: Never true     Ran Out of Food in the Last Year: Never true  Transportation Needs: No Transportation Needs (09/01/2022)   PRAPARE - Hydrologist (Medical): No    Lack of Transportation (Non-Medical): No  Physical Activity: Inactive (09/01/2022)   Exercise Vital Sign    Days of Exercise per Week: 0 days    Minutes of Exercise per Session: 0 min  Stress: Stress Concern Present (09/01/2022)   [place]    Feeling of Stress : To some extent  Social Connections: Unknown (09/01/2022)   Social Connection and Isolation Panel [NHANES]    Frequency of Communication with Friends and Family: More than three times a week    Frequency of Social Gatherings with Friends and Family: Twice a week    Attends Religious Services: Not on Advertising copywriter or Organizations: Yes    Attends Music therapist: More than 4 times per year    Marital Status: Married    Tobacco Counseling Ready to quit: Not Answered Counseling given: Not Answered   Clinical Intake:  Pre-visit preparation completed: Yes  Pain : No/denies pain Pain Score: 0-No pain     BMI - recorded: 31.93 Nutritional Status: BMI > 30  Obese Nutritional Risks: None Diabetes: No CBG done?: No Did pt. 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 No  Interpreter Needed?: No  Information entered by :: Lisette Abu, LPN.   Activities of Daily Living    09/01/2022    3:36 PM 09/01/2022   11:02 AM  In your present state of health, do you have any difficulty performing the following activities:  Hearing? 1 1  Vision? 1 1  Difficulty concentrating or making decisions? 1 1  Walking or climbing  stairs? 1 1  Dressing or bathing? 0 0  Doing errands, shopping? 1 1  Preparing Food and eating ? N N  Using the Toilet? N N  In the past six months, have you accidently leaked urine? N N  Do you have problems with loss of bowel control? N N  Managing your Medications? Y Y  Managing your Finances? N N  Housekeeping or managing your Housekeeping? N N    Patient Care Team: [name], MD as PCP - General (Internal Medicine) [name], [name], MD as PCP - Cardiology (Cardiology) [name], [name], MD as Consulting Physician (Cardiology) Mcarthur Rossetti, MD as Consulting Physician (Orthopedic Surgery) Gardiner Barefoot, DPM as Consulting Physician (Podiatry) [name], [name], DO as Consulting Physician (Neurology) Lonia Skinner, MD as Consulting Physician (Ophthalmology)  Indicate any recent Medical Services you may have received from other than [facility] providers in the past year (date may be approximate).     Assessment:   This is a routine wellness examination for [place].  Hearing/Vision screen Hearing Screening - Comments:: Patient has hearing difficulty and wears hearing aids. Vision Screening - Comments:: Wears rx glasses - up to date with routine eye exams with Gala Romney, MD.   Dietary issues and exercise activities discussed: Current Exercise Habits: The patient does not participate in regular exercise at present, Exercise limited by: cardiac condition(s);respiratory conditions(s);orthopedic condition(s)   Goals Addressed             This Visit's Progress    Client understands the importance of follow-up with providers by attending scheduled visits.        Depression Screen    09/01/2022    3:32 PM 06/28/2022   11:32 AM 04/20/2022    1:33 PM 04/20/2022    1:31 PM 08/30/2021    2:03 PM 08/30/2021    2:01 PM 09/29/2020   11:05 AM  PHQ 2/9 Scores  PHQ - 2 Score 0 0 0 0 0 0 0  PHQ- 9 Score 0      0    Fall Risk    09/01/2022    3:35 PM 09/01/2022   11:02 AM  06/28/2022   11:32 AM 05/19/2022    9:06 AM 04/20/2022    1:33 PM  Fall Risk   Falls in the past year? 0 0 0 0 1  Number falls in past yr: 0 0 0 0 1  Injury with Fall? 0 0 0 0 0  Risk for fall due to : No Fall Risks    History of fall(s)  Follow up Falls prevention discussed   Falls evaluation completed Falls evaluation completed    [place]:  Any stairs in or around the home? No  If so, are there any without handrails? No  Home free of loose throw rugs in walkways, pet beds, electrical cords, etc? Yes  Adequate lighting in  your home to reduce risk of falls? Yes   ASSISTIVE DEVICES UTILIZED TO PREVENT FALLS:  Life alert? No  Use of a cane, walker or w/c? No  Grab bars in the bathroom? No  Shower chair or bench in shower? Yes  Elevated toilet seat or a handicapped toilet? Yes   TIMED UP AND GO:  Was the test performed? No . Telephonic Visit  Cognitive Function:        09/01/2022    3:36 PM 04/01/2019    9:18 AM  6CIT Screen  What Year? 0 points 0 points  What month? 0 points 0 points  What time? 0 points 0 points  Count back from 20 0 points 0 points  Months in reverse 0 points 0 points  Repeat phrase 0 points 0 points  Total Score 0 points 0 points    Immunizations Immunization History  Administered Date(s) Administered   DT (Pediatric) 06/06/2008   PFIZER(Purple Top)SARS-COV-2 Vaccination 02/17/2020, 03/09/2020, 08/10/2020   Pneumococcal Conjugate-13 09/08/2014, 10/04/2017   Pneumococcal Polysaccharide-23 10/27/2017   Pneumococcal-Unspecified 02/20/2008   Td 06/06/2008   Tdap 11/21/2012, 11/02/2018    TDAP status: Up to date  Flu Vaccine status: Declined, Education has been provided regarding the importance of this vaccine but patient still declined. Advised may receive this vaccine at local pharmacy or Health Dept. Aware to provide a copy of the vaccination record if obtained from local pharmacy or Health Dept.  Verbalized acceptance and understanding.  Pneumococcal vaccine status: Up to date  Covid-19 vaccine status: Completed vaccines  Qualifies for Shingles Vaccine? Yes   Zostavax completed Yes   Shingrix Completed?: No.    Education has been provided regarding the importance of this vaccine. Patient has been advised to call insurance company to determine out of pocket expense if they have not yet received this vaccine. Advised may also receive vaccine at local pharmacy or Health Dept. Verbalized acceptance and understanding.  Screening Tests Health Maintenance  Topic Date Due   Zoster Vaccines- Shingrix (1 of 2) Never done   COVID-19 Vaccine (4 - 2023-24 season) 02/04/2022   OPHTHALMOLOGY EXAM  08/24/2022   INFLUENZA VACCINE  09/04/2022 (Originally 01/04/2022)   Lung Cancer Screening  06/29/2023 (Originally 12/27/1992)   COLONOSCOPY (Pts 45-44yrs Insurance coverage will need to be confirmed)  06/29/2023 (Originally 03/13/2021)   HEMOGLOBIN A1C  12/27/2022   Diabetic kidney evaluation - eGFR measurement  06/29/2023   Diabetic kidney evaluation - Urine ACR  06/29/2023   FOOT EXAM  06/29/2023   Medicare Annual Wellness (AWV)  09/01/2023   DTaP/Tdap/Td (5 - Td or Tdap) 11/01/2028   Pneumonia Vaccine 36+ Years old  Completed   Hepatitis C Screening  Completed   HPV VACCINES  Aged Out    Health Maintenance  Health Maintenance Due  Topic Date Due   Zoster Vaccines- Shingrix (1 of 2) Never done   COVID-19 Vaccine (4 - 2023-24 season) 02/04/2022   OPHTHALMOLOGY EXAM  08/24/2022    Colorectal cancer screening: Type of screening: Colonoscopy. Completed 03/13/2018. Repeat every 3 years (Screening has been postponed by PCP)  Lung Cancer Screening: (Low Dose CT Chest recommended if Age 51-80 years, 30 pack-year currently smoking OR have quit w/in 15years.) does qualify.   Lung Cancer Screening Referral: No; order has been postponed by PCP until 06/29/2023  Additional Screening:  Hepatitis C  Screening: does qualify; Completed 12/04/2019  Vision Screening: Recommended annual ophthalmology exams for early detection of glaucoma and other disorders of the eye. Is  the patient up to date with their annual eye exam?  Yes  Who is the provider or what is the name of the office in which the patient attends annual eye exams? Gala Romney, MD. If pt is not established with a provider, would they like to be referred to a provider to establish care? No .   Dental Screening: Recommended annual dental exams for proper oral hygiene  Community Resource Referral / Chronic Care Management: CRR required this visit?  No   CCM required this visit?  No      Plan:     I have personally reviewed and noted the following in the patient's chart:   Medical and social history Use of alcohol, tobacco or illicit drugs  Current medications and supplements including opioid prescriptions. Patient is not currently taking opioid prescriptions. Functional ability and status Nutritional status Physical activity Advanced directives List of other physicians Hospitalizations, surgeries, and ER visits in previous 12 months Vitals Screenings to include cognitive, depression, and falls Referrals and appointments  In addition, I have reviewed and discussed with patient certain preventive protocols, quality metrics, and best practice recommendations. A written personalized care plan for preventive services as well as general preventive health recommendations were provided to patient.     Sheral Flow, LPN   [id number]   Nurse Notes:  Normal cognitive status assessed by direct observation by this Nurse Health Advisor by telephone conversation. No abnormalities found.   Patient Medicare AWV questionnaire was completed by the patient on 09/01/2022; I have confirmed that all information answered by patient is correct and no changes since this date.

## 2022-09-01 NOTE — Patient Instructions (Signed)
Troy Banks , Thank you for taking time to come for your Medicare Wellness Visit. I appreciate your ongoing commitment to your health goals. Please review the following plan we discussed and let me know if I can assist you in the future.   These are the goals we discussed:  Goals      Client understands the importance of follow-up with providers by attending scheduled visits.        This is a list of the screening recommended for you and due dates:  Health Maintenance  Topic Date Due   Zoster (Shingles) Vaccine (1 of 2) Never done   COVID-19 Vaccine (4 - 2023-24 season) 02/04/2022   Eye exam for diabetics  08/24/2022   Flu Shot  09/04/2022*   Screening for Lung Cancer  06/29/2023*   Colon Cancer Screening  06/29/2023*   Hemoglobin A1C  12/27/2022   Yearly kidney function blood test for diabetes  06/29/2023   Yearly kidney health urinalysis for diabetes  06/29/2023   Complete foot exam   06/29/2023   Medicare Annual Wellness Visit  09/01/2023   DTaP/Tdap/Td vaccine (5 - Td or Tdap) 11/01/2028   Pneumonia Vaccine  Completed   Hepatitis C Screening: USPSTF Recommendation to screen - Ages 45-79 yo.  Completed   HPV Vaccine  Aged Out  *Topic was postponed. The date shown is not the original due date.    Advanced directives: Yes  Conditions/risks identified: Yes  Next appointment: Follow up in one year for your annual wellness visit.   Preventive Care 38 Years and Older, Male  Preventive care refers to lifestyle choices and visits with your health care provider that can promote health and wellness. What does preventive care include? A yearly physical exam. This is also called an annual well check. Dental exams once or twice a year. Routine eye exams. Ask your health care provider how often you should have your eyes checked. Personal lifestyle choices, including: Daily care of your teeth and gums. Regular physical activity. Eating a healthy diet. Avoiding tobacco and drug  use. Limiting alcohol use. Practicing safe sex. Taking low doses of aspirin every day. Taking vitamin and mineral supplements as recommended by your health care provider. What happens during an annual well check? The services and screenings done by your health care provider during your annual well check will depend on your age, overall health, lifestyle risk factors, and family history of disease. Counseling  Your health care provider may ask you questions about your: Alcohol use. Tobacco use. Drug use. Emotional well-being. Home and relationship well-being. Sexual activity. Eating habits. History of falls. Memory and ability to understand (cognition). Work and work Statistician. Screening  You may have the following tests or measurements: Height, weight, and BMI. Blood pressure. Lipid and cholesterol levels. These may be checked every 5 years, or more frequently if you are over 42 years old. Skin check. Lung cancer screening. You may have this screening every year starting at age 80 if you have a 30-pack-year history of smoking and currently smoke or have quit within the past 15 years. Fecal occult blood test (FOBT) of the stool. You may have this test every year starting at age 29. Flexible sigmoidoscopy or colonoscopy. You may have a sigmoidoscopy every 5 years or a colonoscopy every 10 years starting at age 1. Prostate cancer screening. Recommendations will vary depending on your family history and other risks. Hepatitis C blood test. Hepatitis B blood test. Sexually transmitted disease (STD) testing. Diabetes screening.  This is done by checking your blood sugar (glucose) after you have not eaten for a while (fasting). You may have this done every 1-3 years. Abdominal aortic aneurysm (AAA) screening. You may need this if you are a current or former smoker. Osteoporosis. You may be screened starting at age 73 if you are at high risk. Talk with your health care provider about  your test results, treatment options, and if necessary, the need for more tests. Vaccines  Your health care provider may recommend certain vaccines, such as: Influenza vaccine. This is recommended every year. Tetanus, diphtheria, and acellular pertussis (Tdap, Td) vaccine. You may need a Td booster every 10 years. Zoster vaccine. You may need this after age 79. Pneumococcal 13-valent conjugate (PCV13) vaccine. One dose is recommended after age 49. Pneumococcal polysaccharide (PPSV23) vaccine. One dose is recommended after age 31. Talk to your health care provider about which screenings and vaccines you need and how often you need them. This information is not intended to replace advice given to you by your health care provider. Make sure you discuss any questions you have with your health care provider. Document Released: 06/19/2015 Document Revised: 02/10/2016 Document Reviewed: 03/24/2015 Elsevier Interactive Patient Education  2017 [place] Prevention in the Home Falls can cause injuries. They can happen to people of all ages. There are many things you can do to make your home safe and to help prevent falls. What can I do on the outside of my home? Regularly fix the edges of walkways and driveways and fix any cracks. Remove anything that might make you trip as you walk through a door, such as a raised step or threshold. Trim any bushes or trees on the path to your home. Use bright outdoor lighting. Clear any walking paths of anything that might make someone trip, such as rocks or tools. Regularly check to see if handrails are loose or broken. Make sure that both sides of any steps have handrails. Any raised decks and porches should have guardrails on the edges. Have any leaves, snow, or ice cleared regularly. Use sand or salt on walking paths during winter. Clean up any spills in your garage right away. This includes oil or grease spills. What can I do in the bathroom? Use  night lights. Install grab bars by the toilet and in the tub and shower. Do not use towel bars as grab bars. Use non-skid mats or decals in the tub or shower. If you need to sit down in the shower, use a plastic, non-slip stool. Keep the floor dry. Clean up any water that spills on the floor as soon as it happens. Remove soap buildup in the tub or shower regularly. Attach bath mats securely with double-sided non-slip rug tape. Do not have throw rugs and other things on the floor that can make you trip. What can I do in the bedroom? Use night lights. Make sure that you have a light by your bed that is easy to reach. Do not use any sheets or blankets that are too big for your bed. They should not hang down onto the floor. Have a firm chair that has side arms. You can use this for support while you get dressed. Do not have throw rugs and other things on the floor that can make you trip. What can I do in the kitchen? Clean up any spills right away. Avoid walking on wet floors. Keep items that you use a lot in easy-to-reach places. If  you need to reach something above you, use a strong step stool that has a grab bar. Keep electrical cords out of the way. Do not use floor polish or wax that makes floors slippery. If you must use wax, use non-skid floor wax. Do not have throw rugs and other things on the floor that can make you trip. What can I do with my stairs? Do not leave any items on the stairs. Make sure that there are handrails on both sides of the stairs and use them. Fix handrails that are broken or loose. Make sure that handrails are as long as the stairways. Check any carpeting to make sure that it is firmly attached to the stairs. Fix any carpet that is loose or worn. Avoid having throw rugs at the top or bottom of the stairs. If you do have throw rugs, attach them to the floor with carpet tape. Make sure that you have a light switch at the top of the stairs and the bottom of the  stairs. If you do not have them, ask someone to add them for you. What else can I do to help prevent falls? Wear shoes that: Do not have high heels. Have rubber bottoms. Are comfortable and fit you well. Are closed at the toe. Do not wear sandals. If you use a stepladder: Make sure that it is fully opened. Do not climb a closed stepladder. Make sure that both sides of the stepladder are locked into place. Ask someone to hold it for you, if possible. Clearly [place] and make sure that you can see: Any grab bars or handrails. First and last steps. Where the edge of each step is. Use tools that help you move around (mobility aids) if they are needed. These include: Canes. Walkers. Scooters. Crutches. Turn on the lights when you go into a dark area. Replace any light bulbs as soon as they burn out. Set up your furniture so you have a clear path. Avoid moving your furniture around. If any of your floors are uneven, fix them. If there are any pets around you, be aware of where they are. Review your medicines with your doctor. Some medicines can make you feel dizzy. This can increase your chance of falling. Ask your doctor what other things that you can do to help prevent falls. This information is not intended to replace advice given to you by your health care provider. Make sure you discuss any questions you have with your health care provider. Document Released: 03/19/2009 Document Revised: 10/29/2015 Document Reviewed: 06/27/2014 Elsevier Interactive Patient Education  2017 [employer].

## 2022-09-14 NOTE — Telephone Encounter (Signed)
Would you like me to send this patient to another [facility]

## 2022-09-15 ENCOUNTER — Emergency Department (HOSPITAL_COMMUNITY): Payer: Medicare Other

## 2022-09-15 ENCOUNTER — Inpatient Hospital Stay (HOSPITAL_COMMUNITY)
Admission: EM | Admit: 2022-09-15 | Discharge: 2022-09-18 | DRG: 309 | Disposition: A | Discharge status: Home / Self Care | Payer: Medicare Other | Attending: Family Medicine | Admitting: Family Medicine

## 2022-09-15 ENCOUNTER — Other Ambulatory Visit: Payer: Self-pay

## 2022-09-15 DIAGNOSIS — I2510 Atherosclerotic heart disease of native coronary artery without angina pectoris: Secondary | ICD-10-CM | POA: Diagnosis not present

## 2022-09-15 DIAGNOSIS — G250 Essential tremor: Secondary | ICD-10-CM | POA: Diagnosis present

## 2022-09-15 DIAGNOSIS — R6889 Other general symptoms and signs: Secondary | ICD-10-CM | POA: Diagnosis not present

## 2022-09-15 DIAGNOSIS — Z79890 Hormone replacement therapy (postmenopausal): Secondary | ICD-10-CM | POA: Diagnosis not present

## 2022-09-15 DIAGNOSIS — R079 Chest pain, unspecified: Secondary | ICD-10-CM | POA: Diagnosis not present

## 2022-09-15 DIAGNOSIS — I25709 Atherosclerosis of coronary artery bypass graft(s), unspecified, with unspecified angina pectoris: Secondary | ICD-10-CM | POA: Diagnosis not present

## 2022-09-15 DIAGNOSIS — Z7982 Long term (current) use of aspirin: Secondary | ICD-10-CM

## 2022-09-15 DIAGNOSIS — F4310 Post-traumatic stress disorder, unspecified: Secondary | ICD-10-CM | POA: Diagnosis present

## 2022-09-15 DIAGNOSIS — I4891 Unspecified atrial fibrillation: Principal | ICD-10-CM

## 2022-09-15 DIAGNOSIS — K219 Gastro-esophageal reflux disease without esophagitis: Secondary | ICD-10-CM | POA: Diagnosis present

## 2022-09-15 DIAGNOSIS — E8809 Other disorders of plasma-protein metabolism, not elsewhere classified: Secondary | ICD-10-CM | POA: Diagnosis not present

## 2022-09-15 DIAGNOSIS — Z7901 Long term (current) use of anticoagulants: Secondary | ICD-10-CM | POA: Diagnosis not present

## 2022-09-15 DIAGNOSIS — I252 Old myocardial infarction: Secondary | ICD-10-CM

## 2022-09-15 DIAGNOSIS — Z66 Do not resuscitate: Secondary | ICD-10-CM | POA: Diagnosis present

## 2022-09-15 DIAGNOSIS — S2231XA Fracture of one rib, right side, initial encounter for closed fracture: Secondary | ICD-10-CM | POA: Diagnosis not present

## 2022-09-15 DIAGNOSIS — Z953 Presence of xenogenic heart valve: Secondary | ICD-10-CM

## 2022-09-15 DIAGNOSIS — Z96643 Presence of artificial hip joint, bilateral: Secondary | ICD-10-CM | POA: Diagnosis not present

## 2022-09-15 DIAGNOSIS — R0789 Other chest pain: Secondary | ICD-10-CM

## 2022-09-15 DIAGNOSIS — I499 Cardiac arrhythmia, unspecified: Secondary | ICD-10-CM | POA: Diagnosis not present

## 2022-09-15 DIAGNOSIS — G43909 Migraine, unspecified, not intractable, without status migrainosus: Secondary | ICD-10-CM | POA: Diagnosis present

## 2022-09-15 DIAGNOSIS — G4733 Obstructive sleep apnea (adult) (pediatric): Secondary | ICD-10-CM | POA: Diagnosis not present

## 2022-09-15 DIAGNOSIS — Z951 Presence of aortocoronary bypass graft: Secondary | ICD-10-CM

## 2022-09-15 DIAGNOSIS — E876 Hypokalemia: Secondary | ICD-10-CM | POA: Diagnosis present

## 2022-09-15 DIAGNOSIS — G8929 Other chronic pain: Secondary | ICD-10-CM | POA: Diagnosis not present

## 2022-09-15 DIAGNOSIS — Z981 Arthrodesis status: Secondary | ICD-10-CM

## 2022-09-15 DIAGNOSIS — I480 Paroxysmal atrial fibrillation: Secondary | ICD-10-CM | POA: Diagnosis not present

## 2022-09-15 DIAGNOSIS — I110 Hypertensive heart disease with heart failure: Secondary | ICD-10-CM | POA: Diagnosis present

## 2022-09-15 DIAGNOSIS — Z85828 Personal history of other malignant neoplasm of skin: Secondary | ICD-10-CM | POA: Diagnosis not present

## 2022-09-15 DIAGNOSIS — E039 Hypothyroidism, unspecified: Secondary | ICD-10-CM | POA: Diagnosis present

## 2022-09-15 DIAGNOSIS — Z743 Need for continuous supervision: Secondary | ICD-10-CM | POA: Diagnosis not present

## 2022-09-15 DIAGNOSIS — I5032 Chronic diastolic (congestive) heart failure: Secondary | ICD-10-CM | POA: Diagnosis present

## 2022-09-15 DIAGNOSIS — I4819 Other persistent atrial fibrillation: Secondary | ICD-10-CM | POA: Diagnosis not present

## 2022-09-15 DIAGNOSIS — I272 Pulmonary hypertension, unspecified: Secondary | ICD-10-CM | POA: Diagnosis not present

## 2022-09-15 DIAGNOSIS — M109 Gout, unspecified: Secondary | ICD-10-CM | POA: Diagnosis not present

## 2022-09-15 DIAGNOSIS — Z885 Allergy status to narcotic agent status: Secondary | ICD-10-CM

## 2022-09-15 DIAGNOSIS — E7800 Pure hypercholesterolemia, unspecified: Secondary | ICD-10-CM | POA: Diagnosis present

## 2022-09-15 DIAGNOSIS — Z79899 Other long term (current) drug therapy: Secondary | ICD-10-CM

## 2022-09-15 DIAGNOSIS — F419 Anxiety disorder, unspecified: Secondary | ICD-10-CM | POA: Diagnosis present

## 2022-09-15 DIAGNOSIS — Z888 Allergy status to other drugs, medicaments and biological substances status: Secondary | ICD-10-CM

## 2022-09-15 DIAGNOSIS — I484 Atypical atrial flutter: Secondary | ICD-10-CM | POA: Diagnosis not present

## 2022-09-15 DIAGNOSIS — Z8249 Family history of ischemic heart disease and other diseases of the circulatory system: Secondary | ICD-10-CM | POA: Diagnosis not present

## 2022-09-15 DIAGNOSIS — T426X5A Adverse effect of other antiepileptic and sedative-hypnotic drugs, initial encounter: Secondary | ICD-10-CM | POA: Diagnosis not present

## 2022-09-15 DIAGNOSIS — Z87891 Personal history of nicotine dependence: Secondary | ICD-10-CM

## 2022-09-15 DIAGNOSIS — J811 Chronic pulmonary edema: Secondary | ICD-10-CM | POA: Diagnosis not present

## 2022-09-15 DIAGNOSIS — Z833 Family history of diabetes mellitus: Secondary | ICD-10-CM

## 2022-09-15 DIAGNOSIS — I4892 Unspecified atrial flutter: Secondary | ICD-10-CM | POA: Diagnosis not present

## 2022-09-15 LAB — CBC WITH DIFFERENTIAL/PLATELET
Abs Immature Granulocytes: 0.03 10*3/uL (ref 0.00–0.07)
Basophils Absolute: 0.1 10*3/uL (ref 0.0–0.1)
Basophils Relative: 1 %
Eosinophils Absolute: 0.1 10*3/uL (ref 0.0–0.5)
Eosinophils Relative: 2 %
HCT: 38.5 % — ABNORMAL LOW (ref 39.0–52.0)
Hemoglobin: 13.1 g/dL (ref 13.0–17.0)
Immature Granulocytes: 0 %
Lymphocytes Relative: 18 %
Lymphs Abs: 1.3 10*3/uL (ref 0.7–4.0)
MCH: 31.6 pg (ref 26.0–34.0)
MCHC: 34 g/dL (ref 30.0–36.0)
MCV: 92.8 fL (ref 80.0–100.0)
Monocytes Absolute: 0.9 10*3/uL (ref 0.1–1.0)
Monocytes Relative: 13 %
Neutro Abs: 4.6 10*3/uL (ref 1.7–7.7)
Neutrophils Relative %: 66 %
Platelets: 141 10*3/uL — ABNORMAL LOW (ref 150–400)
RBC: 4.15 MIL/uL — ABNORMAL LOW (ref 4.22–5.81)
RDW: 13.8 % (ref 11.5–15.5)
WBC: 7 10*3/uL (ref 4.0–10.5)
nRBC: 0 % (ref 0.0–0.2)

## 2022-09-15 LAB — PROTIME-INR
INR: 1.1 (ref 0.8–1.2)
Prothrombin Time: 14.4 seconds (ref 11.4–15.2)

## 2022-09-15 LAB — MAGNESIUM: Magnesium: 1.7 mg/dL (ref 1.7–2.4)

## 2022-09-15 LAB — COMPREHENSIVE METABOLIC PANEL
ALT: 22 U/L (ref 0–44)
AST: 20 U/L (ref 15–41)
Albumin: 3 g/dL — ABNORMAL LOW (ref 3.5–5.0)
Alkaline Phosphatase: 46 U/L (ref 38–126)
Anion gap: 13 (ref 5–15)
BUN: 11 mg/dL (ref 8–23)
CO2: 21 mmol/L — ABNORMAL LOW (ref 22–32)
Calcium: 7.8 mg/dL — ABNORMAL LOW (ref 8.9–10.3)
Chloride: 108 mmol/L (ref 98–111)
Creatinine, Ser: 0.8 mg/dL (ref 0.61–1.24)
GFR, Estimated: >60 mL/min (ref >60)
Glucose, Bld: 126 mg/dL — ABNORMAL HIGH (ref 70–99)
Potassium: 2.8 mmol/L — ABNORMAL LOW (ref 3.5–5.1)
Sodium: 142 mmol/L (ref 135–145)
Total Bilirubin: 1.2 mg/dL (ref 0.3–1.2)
Total Protein: 5 g/dL — ABNORMAL LOW (ref 6.5–8.1)

## 2022-09-15 LAB — TROPONIN I (HIGH SENSITIVITY)
Troponin I (High Sensitivity): 29 ng/L — ABNORMAL HIGH (ref <18)
Troponin I (High Sensitivity): 34 ng/L — ABNORMAL HIGH (ref <18)
Troponin I (High Sensitivity): 39 ng/L — ABNORMAL HIGH (ref <18)

## 2022-09-15 MED ORDER — VITAMIN D 25 MCG (1000 UNIT) PO TABS
1000.0000 [IU] | ORAL_TABLET | ORAL | Status: DC
  Administered 2022-09-16 – 2022-09-18 (×2): 1000 [IU] via ORAL
  Filled 2022-09-15 (×3): qty 1

## 2022-09-15 MED ORDER — DILTIAZEM LOAD VIA INFUSION
10.0000 mg | Freq: Once | INTRAVENOUS | Status: DC
  Filled 2022-09-15: qty 10

## 2022-09-15 MED ORDER — ISOSORBIDE MONONITRATE ER 30 MG PO TB24
15.0000 mg | ORAL_TABLET | Freq: Every day | ORAL | Status: DC
  Administered 2022-09-16 – 2022-09-18 (×3): 15 mg via ORAL
  Filled 2022-09-15 (×3): qty 1

## 2022-09-15 MED ORDER — METOPROLOL TARTRATE 5 MG/5ML IV SOLN: 5.0000 mg | Freq: Once | INTRAVENOUS | Status: DC

## 2022-09-15 MED ORDER — PRIMIDONE 50 MG PO TABS
50.0000 mg | ORAL_TABLET | Freq: Two times a day (BID) | ORAL | Status: DC
  Administered 2022-09-15 – 2022-09-17 (×4): 50 mg via ORAL
  Filled 2022-09-15 (×5): qty 1

## 2022-09-15 MED ORDER — HEPARIN BOLUS VIA INFUSION
5000.0000 [IU] | Freq: Once | INTRAVENOUS | Status: AC
  Administered 2022-09-15: 5000 [IU] via INTRAVENOUS
  Filled 2022-09-15: qty 5000

## 2022-09-15 MED ORDER — DILTIAZEM HCL-DEXTROSE 125-5 MG/125ML-% IV SOLN (PREMIX): 5.0000 mg/h | INTRAVENOUS | Status: DC

## 2022-09-15 MED ORDER — HEPARIN (PORCINE) 25000 UT/250ML-% IV SOLN
1700.0000 [IU]/h | INTRAVENOUS | Status: AC
  Administered 2022-09-15: 1300 [IU]/h via INTRAVENOUS
  Administered 2022-09-16: 1500 [IU]/h via INTRAVENOUS
  Filled 2022-09-15 (×2): qty 250

## 2022-09-15 MED ORDER — AMIODARONE HCL IN DEXTROSE 360-4.14 MG/200ML-% IV SOLN: 60.0000 mg/h | INTRAVENOUS | Status: DC

## 2022-09-15 MED ORDER — FUROSEMIDE 40 MG PO TABS
40.0000 mg | ORAL_TABLET | ORAL | Status: DC
  Administered 2022-09-18: 40 mg via ORAL
  Filled 2022-09-15 (×2): qty 1

## 2022-09-15 MED ORDER — AMIODARONE LOAD VIA INFUSION
150.0000 mg | Freq: Once | INTRAVENOUS | Status: DC
  Filled 2022-09-15 (×2): qty 83.34

## 2022-09-15 MED ORDER — ACETAMINOPHEN 500 MG PO TABS: 1000.0000 mg | ORAL_TABLET | Freq: Four times a day (QID) | ORAL | Status: DC | PRN

## 2022-09-15 MED ORDER — POTASSIUM CHLORIDE 10 MEQ/100ML IV SOLN
10.0000 meq | INTRAVENOUS | Status: AC
  Administered 2022-09-15 – 2022-09-16 (×3): 10 meq via INTRAVENOUS
  Filled 2022-09-15: qty 100

## 2022-09-15 MED ORDER — AMIODARONE HCL IN DEXTROSE 360-4.14 MG/200ML-% IV SOLN: 30.0000 mg/h | INTRAVENOUS | Status: DC

## 2022-09-15 MED ORDER — TIZANIDINE HCL 4 MG PO TABS: 2.0000 mg | ORAL_TABLET | Freq: Three times a day (TID) | ORAL | Status: DC | PRN

## 2022-09-15 MED ORDER — DILTIAZEM HCL-DEXTROSE 125-5 MG/125ML-% IV SOLN (PREMIX)
5.0000 mg/h | INTRAVENOUS | Status: AC
  Administered 2022-09-15: 5 mg/h via INTRAVENOUS
  Administered 2022-09-16 (×2): 10 mg/h via INTRAVENOUS
  Filled 2022-09-15 (×2): qty 125

## 2022-09-15 MED ORDER — ASPIRIN 81 MG PO TBEC
81.0000 mg | DELAYED_RELEASE_TABLET | ORAL | Status: DC
  Administered 2022-09-17: 81 mg via ORAL
  Filled 2022-09-15 (×2): qty 1

## 2022-09-15 MED ORDER — POTASSIUM CHLORIDE CRYS ER 20 MEQ PO TBCR
40.0000 meq | EXTENDED_RELEASE_TABLET | Freq: Two times a day (BID) | ORAL | Status: DC
  Administered 2022-09-15 – 2022-09-18 (×6): 40 meq via ORAL
  Filled 2022-09-15 (×6): qty 2

## 2022-09-15 MED ORDER — VITAMIN C 500 MG PO TABS
1000.0000 mg | ORAL_TABLET | Freq: Every day | ORAL | Status: DC
  Administered 2022-09-16 – 2022-09-18 (×3): 1000 mg via ORAL
  Filled 2022-09-15 (×3): qty 2

## 2022-09-15 MED ORDER — ALLOPURINOL 300 MG PO TABS
300.0000 mg | ORAL_TABLET | Freq: Every day | ORAL | Status: DC
  Administered 2022-09-16 – 2022-09-18 (×3): 300 mg via ORAL
  Filled 2022-09-15 (×3): qty 1

## 2022-09-15 MED ORDER — LEVOTHYROXINE SODIUM 25 MCG PO TABS
25.0000 ug | ORAL_TABLET | Freq: Every day | ORAL | Status: DC
  Administered 2022-09-16 – 2022-09-18 (×3): 25 ug via ORAL
  Filled 2022-09-15 (×3): qty 1

## 2022-09-15 MED ORDER — FAMOTIDINE 20 MG PO TABS
40.0000 mg | ORAL_TABLET | Freq: Every day | ORAL | Status: DC
  Administered 2022-09-16 – 2022-09-18 (×3): 40 mg via ORAL
  Filled 2022-09-15 (×3): qty 2

## 2022-09-15 NOTE — ED Provider Notes (Signed)
[name] EMERGENCY DEPARTMENT AT [facility] Provider Note   CSN: [id number] Arrival date & time: 09/15/22  1441     History  Chief Complaint  Patient presents with   Chest Pain    Troy Banks is a 82 y.o. male.  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  He reports today he just could not seem to hide it and was even having difficulty getting up.  Patient reports he intermittently has palpitations but has not had significant shortness of breath.  He has not had any leg swelling, cough, fever, abdominal pain, nausea or vomiting.  He reports the chest pain is typically in the lower portion of the chest and comes and goes intermittently.  Is not necessarily associated with exertion.  He reports today he went out and walked his dog and it was after he returned and sat down that he had pain around 9:00 this morning that was very uncomfortable that he could not hide from his wife.  He has been compliant with his medications and has had no recent changes.  The history is provided by the patient, medical records and the spouse.  Chest Pain      Home Medications Prior to Admission medications   Medication Sig Start Date End Date Taking? Authorizing Provider  acetaminophen (TYLENOL) 500 MG tablet Take 1,000 mg by mouth every 6 (six) hours as needed for moderate pain. 12/18/17   [provider]  allopurinol (ZYLOPRIM) 300 MG tablet TAKE 1 TABLET BY MOUTH EVERY DAY 05/02/22   Corwin Levins, MD  amLODipine (NORVASC) 10 MG tablet TAKE 1 TABLET BY MOUTH EVERY DAY 08/05/22   [place], [name], MD  Ascorbic Acid (VITAMIN C) 1000 MG tablet Take 1,000 mg by mouth  daily.    [provider]  aspirin EC 81 MG tablet Take 81 mg by mouth every other day. Swallow whole.    [provider]  calcium carbonate (TUMS EX) 750 MG chewable tablet Chew 1 tablet by mouth daily as needed for heartburn.     [provider]  Carboxymethylcellulose Sodium (LUBRICANT EYE DROPS OP) Place 1 drop into both eyes 2 (two) times daily as needed (dry/irritated eyes.).     [provider]  Cholecalciferol (VITAMIN D-3) 25 MCG (1000 UT) CAPS Take 1,000 Units by mouth every other day.    [provider]  famotidine (PEPCID) 40 MG tablet Take 1 tablet (40 mg total) by mouth daily. 06/28/22   Corwin Levins, MD  furosemide (LASIX) 40 MG tablet TAKE 1 TABLET BY MOUTH EVERY OTHER DAY 05/20/22   [place], [name], MD  isosorbide mononitrate (IMDUR) 30 MG 24 hr tablet Take 1 tablet (30 mg total) by mouth daily. 12/04/21   Tereso Newcomer T, [name]  levothyroxine (SYNTHROID) 25 MCG tablet TAKE 1 TABLET BY MOUTH EVERY DAY WITH BREAKFAST Patient taking differently: Take 25 mcg by mouth daily before breakfast. 06/29/21   Corwin Levins, MD  nitroGLYCERIN (NITROSTAT) 0.4 MG SL tablet PLACE 1 TABLET UNDER THE TONGUE EVERY 5 MINUTES AS NEEDED FOR CHEST PAIN. Patient taking differently: Place 0.4 mg under the tongue every 5 (five) minutes as needed  for chest pain. 11/30/21   [place], [name], MD  primidone (MYSOLINE) 50 MG tablet TAKE 1 TABLET BY MOUTH 2 TIMES DAILY 08/22/22   [name], Octaviano Battyebecca S, DO  rosuvastatin (CRESTOR) 10 MG tablet TAKE 1/2 TABLET BY MOUTH EVERY DAY Patient taking differently: Take 5 mg by mouth every evening. 12/01/21   Rollene RotundaHochrein, James, MD  tiZANidine (ZANAFLEX) 4 MG tablet Take 2 mg by mouth every 8 (eight) hours as needed for muscle spasms. 12/18/17   [provider]  Turmeric 500 MG CAPS Take 500 mg by mouth daily.     [provider]  vitamin B-12 (CYANOCOBALAMIN) 1000 MCG tablet Take 1,000 mcg by mouth every other day.    [provider]  ranitidine (ZANTAC) 150 MG tablet TAKE 1 TABLET BY MOUTH 2 TIMES DAILY Patient taking differently: No sig reported 09/05/18 12/13/18  Corwin LevinsJohn, James W, MD      Allergies    Acyclovir and related, Benzodiazepines, Compazine [prochlorperazine maleate], Coreg [carvedilol], Flomax [tamsulosin hcl], Lisinopril, Mobic [meloxicam], Morphine and related, Ondansetron, Oxycodone, Promethazine, Sertraline hcl, Tramadol, Atorvastatin, Colchicine, and Fentanyl    Review of Systems   Review of Systems  Cardiovascular:  Positive for chest pain.    Physical Exam Updated Vital Signs BP 116/75   Pulse (!) 51   Temp 98.2 F (36.8 C)   Resp (!) 21   Ht 5\' 8"  (1.727 m)   Wt 96 kg   SpO2 97%   BMI 32.18 kg/m  Physical Exam Vitals and nursing note reviewed.  Constitutional:      General: He is not in acute distress.    Appearance: He is well-developed.  HENT:     Head: Normocephalic and atraumatic.  Eyes:     Conjunctiva/sclera: Conjunctivae normal.     Pupils: Pupils are equal, round, and reactive to light.  Cardiovascular:     Rate and Rhythm: Tachycardia present. Rhythm irregularly irregular.     Heart sounds: No murmur heard.    Comments: Well-healed sternotomy scar Pulmonary:     Effort: Pulmonary effort is normal. No respiratory distress.     Breath sounds: Normal breath sounds. No wheezing or rales.  Abdominal:     General: There is no distension.     Palpations: Abdomen is soft.     Tenderness: There is no abdominal tenderness. There is no guarding or rebound.  Musculoskeletal:        General: No tenderness. Normal range of motion.     Cervical back: Normal range of motion and neck supple.     Right lower leg: No edema.     Left lower leg: No edema.  Skin:    General: Skin is warm and dry.     Findings: No erythema or rash.  Neurological:     Mental Status: He is alert and oriented to person, place, and time.  Psychiatric:        Behavior: Behavior normal.      ED Results / Procedures / Treatments   Labs (all labs ordered are listed, but only abnormal results are displayed) Labs Reviewed  CBC WITH DIFFERENTIAL/PLATELET - Abnormal; Notable for the following components:      Result Value   RBC 4.15 (*)    HCT 38.5 (*)    Platelets 141 (*)    All other components within normal limits  COMPREHENSIVE METABOLIC PANEL  PROTIME-INR  TROPONIN I (HIGH SENSITIVITY)    EKG EKG Interpretation  Date/Time:  Thursday September 15 2022  14:47:06 EDT Ventricular Rate:  137 PR Interval:    QRS Duration: 84 QT Interval:  317 QTC Calculation: 479 R Axis:   33 Text Interpretation: new Atrial fibrillation Repolarization abnormality, prob rate related Confirmed by Gwyneth Sprout ([id number]) on 09/15/2022 2:54:02 PM  Radiology DG Chest Port 1 View  Result Date: 09/15/2022 CLINICAL DATA:  Provided history: Chest pain. AFib with RVR. EXAM: PORTABLE CHEST 1 VIEW COMPARISON:  Prior chest radiographs 04/20/2022 and earlier. FINDINGS: Prior median sternotomy, CABG and aortic valve replacement. Heart size within normal limits. Aortic atherosclerosis. Central pulmonary vascular congestion without overt pulmonary edema. No appreciable airspace consolidation. No evidence of pleural effusion or pneumothorax. No acute osseous abnormality identified. Chronic, healed right rib fractures. ACDF hardware. IMPRESSION: 1. Central pulmonary vascular congestion without overt pulmonary edema. 2.  Aortic Atherosclerosis (ICD10-I70.0). Electronically Signed   By: Jackey Loge D.O.   On: 09/15/2022 15:32    Procedures Procedures    Medications Ordered in ED Medications - No data to display  ED Course/ Medical Decision Making/ A&P                             Medical Decision Making Amount and/or Complexity of Data Reviewed Labs: ordered. Radiology: ordered.   Pt with multiple medical problems and comorbidities and presenting today with a complaint that caries a high risk  for morbidity and mortality.  Here today with complaints of chest pain and palpitations.  Upon arrival here patient was found to be in A-fib RVR.  I independently interpreted patient's EKG which showed a heart rate in the 140s without ischemic changes. I have independently visualized and interpreted pt's images today.  Chest x-ray today with minimal fluid bilaterally.  Labs are pending.  If patient's INR is subtherapeutic then feel he would be a candidate for cardioversion.  Given his cytochrome P450 he and his wife are not comfortable with any beta-blockers or Cardizem at this time and he would prefer to wait for labs to come back.  His blood pressure is currently stable he is well-appearing.          Final Clinical Impression(s) / ED Diagnoses Final diagnoses:  None    Rx / DC Orders ED Discharge Orders     None         Gwyneth Sprout, MD 09/15/22 1611

## 2022-09-15 NOTE — ED Notes (Signed)
ED TO INPATIENT HANDOFF REPORT  ED Nurse Name and Phone #: Donnella Bi [phone]  S Name/Age/Gender Troy Banks 82 y.o. male Room/Bed: 012C/012C  Code Status   Code Status: DNR  Home/SNF/Other Home Patient oriented to: self, place, time, and situation Is this baseline? No   Triage Complete: Triage complete  Chief Complaint Atrial flutter [I48.92]  Triage Note Pt arrived via ems for chest pain with afib with rvr Pt has long hx but not has had afib in many years Per wife last time pt ended up having CABG Pt had NTG x 3 and 324 mg ASA    Allergies Allergies  Allergen Reactions   Acyclovir And Related Other (See Comments)    Accumulates and causes stroke-like side-effects due to cytochrome P450 enzyme deficiency   Benzodiazepines Other (See Comments)    Patient states he gets stroke symptoms with benzo's.    Compazine [Prochlorperazine Maleate] Other (See Comments)    Accumulates and causes stroke-like side-effects due to cytochrome P450 enzyme deficiency   Coreg [Carvedilol] Other (See Comments)    Accumulates and causes stroke-like side-effects due to cytochrome P450 enzyme deficiency Patient poor metabolizer of CYP2D6 - Coreg undergoes extensive hepatic (including 2D6)   Flomax [Tamsulosin Hcl] Other (See Comments)    Accumulates and causes stroke-like side-effects due to cytochrome P450 enzyme deficiency   Lisinopril Other (See Comments)    Hyperkalemia. 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   Mobic [Meloxicam] Other (See Comments)    Accumulates and causes stroke-like side-effects due to cytochrome P450 enzyme deficiency   Morphine And Related Other (See Comments)    Per pt's wife morphine also contraindicated due to p450 enzyme deficiency.     Ondansetron Other (See Comments)    Accumulates and causes stroke-like side-effects due to cytochrome P450 enzyme deficiency    Oxycodone Other (See Comments)     Accumulates and causes stroke-like side-effects due to cytochrome P450 enzyme deficiency   Promethazine Other (See Comments)    Accumulates and causes stroke-like side-effects due to cytochrome P450 enzyme deficiency   Sertraline Hcl Other (See Comments)    Accumulates and causes stroke-like side-effects due to cytochrome P450 enzyme deficiency   Tramadol Other (See Comments)    Accumulates and causes stroke-like side-effects due to cytochrome P450 enzyme deficiency   Atorvastatin Other (See Comments)    MYALGIAS   Colchicine Other (See Comments)    ataxia   Fentanyl Other (See Comments)    Accumulates and cause stroke like symptoms    Level of Care/Admitting Diagnosis ED Disposition     ED Disposition  Admit   Condition  --   Comment  Hospital Area: [facility] [[id number]]  Level of Care: Progressive [102]  Admit to Progressive based on following criteria: CARDIOVASCULAR & THORACIC of moderate stability with acute coronary syndrome symptoms/low risk myocardial infarction/hypertensive urgency/arrhythmias/heart failure potentially compromising stability and stable post cardiovascular intervention patients.  May admit patient to [facility] or [facility] if equivalent level of care is available:: No  Covid Evaluation: Asymptomatic - no recent exposure (last 10 days) testing not required  Diagnosis: Atrial flutter [427.32.ICD-9-CM]  Admitting Physician: Rhetta Mura [[id number]]  Attending Physician: Rhetta Mura [phone]  Certification:: I certify this patient will need inpatient services for at least 2 midnights  Estimated Length of Stay: 3          B Medical/Surgery History Past Medical History:  Diagnosis Date   Allergy  Anxiety    PTSD   Aortic stenosis, moderate 03/27/2015   post AVR echo Echo 11/16: Mild LVH, EF 55-60%, normal wall motion, grade 1 diastolic dysfunction, bioprosthetic AVR okay (mean gradient 18 mmHg) with trivial AI, mild LAE,  atrial septal aneurysm, small effusion    Arthritis    "qwhere" (10/29/2015)   Cancer (HCC)    skin cancer   Carotid artery stenosis    Carotid US 11/17: 1-39% bilateral ICA stenosis. F/u prn   Cervical stenosis of spinal canal    fusion 2006   CHF (congestive heart failure) (HCC)    "secondry to [facility]"   Closed left pilon fracture    COLONIC POLYPS, HX OF    Complication of anesthesia    "takes a lot to put him under and has woken up during surgery" Difficult to wake up . PTSD; "does not metabolize RX well"that he gets "violent", per pt.    Coronary artery disease    DIVERTICULOSIS, COLON    Dysrhythmia    2 bouts of afib post op and at home but corrected with medication.   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)    confirmed heterozygous 10/24/14 labs   PTSD (post-traumatic stress disorder)    PTSD (post-traumatic stress disorder)    Sleep apnea    On a CPAP   Past Surgical History:  Procedure Laterality Date   ANKLE FUSION Left 03/17/2017   Procedure: FUSION  PILON FRACTURE WITH BONE GRAFTING;  Surgeon: Roby Lofts, MD;  Location: [facility] OR;  Service: Orthopedics;  Laterality: Left;   ANTERIOR FUSION CERVICAL SPINE  2002   AORTIC VALVE REPLACEMENT N/A 03/30/2015   Procedure: AORTIC VALVE REPLACEMENT (AVR);  Surgeon: Delight Ovens, MD;  Location: [facility] OR;  Service: Open Heart Surgery;  Laterality: N/A;   CARDIAC  CATHETERIZATION N/A 03/26/2015   Procedure: Left Heart Cath and Coronary Angiography;  Surgeon: Marykay Lex, MD;  Location: [facility] INVASIVE CV LAB;  Service: Cardiovascular;  Laterality: N/A;   CARDIAC VALVE REPLACEMENT     COLONOSCOPY W/ POLYPECTOMY     CORONARY ARTERY BYPASS GRAFT N/A 03/30/2015   Procedure: CORONARY ARTERY BYPASS GRAFTING (CABG) x four, using left internal mammary artery and left    leg greater saphenous vein harvested endoscopically ;  Surgeon: Delight Ovens, MD;  Location: [facility] OR;  Service: Open Heart Surgery;  Laterality: N/A;   CORONARY/GRAFT ANGIOGRAPHY N/A 12/03/2021   Procedure: CORONARY/GRAFT ANGIOGRAPHY;  Surgeon: Swaziland, [name], MD;  Location: [facility] INVASIVE CV LAB;  Service: Cardiovascular;  Laterality: N/A;   CYSTOSCOPY/URETEROSCOPY/HOLMIUM LASER/STENT PLACEMENT Bilateral 11/07/2016   Procedure: CYSTOSCOPY/URETEROSCOPY/HOLMIUM LASER/STENT PLACEMENT;  Surgeon: Hildred Laser, MD;  Location: [facility] ORS;  Service: Urology;  Laterality: Bilateral;   CYSTOSCOPY/URETEROSCOPY/HOLMIUM LASER/STENT PLACEMENT Bilateral 11/23/2016  Procedure: CYSTOSCOPY/BILATERAL URETEROSCOPY/HOLMIUM LASER/STENT EXCHANGE;  Surgeon: Hildred Laser, MD;  Location: [facility] ORS;  Service: Urology;  Laterality: Bilateral;  NEEDS 90 MIN    EXTERNAL FIXATION LEG Left 02/22/2017   Procedure: EXTERNAL FIXATION LEFT ANKLE;  Surgeon: Sheral Apley, MD;  Location[facility] OR;  Service: Orthopedics;  Laterality: Left;   HEMORRHOID BANDING  1970s   INGUINAL HERNIA REPAIR Left 1962   KIDNEY STONE SURGERY  1986   "cut me open"   LEFT HEART CATH AND CORS/GRAFTS ANGIOGRAPHY N/A 10/25/2018   Procedure: LEFT HEART CATH AND CORS/GRAFTS ANGIOGRAPHY;  Surgeon: Marykay Lex, MD;  Location: [facility] INVASIVE CV LAB;  Service: Cardiovascular;  Laterality: N/A;   LEFT HEART CATH AND CORS/GRAFTS ANGIOGRAPHY N/A 12/03/2021   Procedure: LEFT HEART CATH AND CORS/GRAFTS ANGIOGRAPHY;  Surgeon: Swaziland, [name], MD;  Location: [facility]  INVASIVE CV LAB;  Service: Cardiovascular;  Laterality: N/A;   POSTERIOR FUSION CERVICAL SPINE  2006   TEE WITHOUT CARDIOVERSION N/A 03/30/2015   Procedure: TRANSESOPHAGEAL ECHOCARDIOGRAM (TEE);  Surgeon: Delight Ovens, MD;  Location: [facility] OR;  Service: Open Heart Surgery;  Laterality: N/A;   TESTICLE TORSION REDUCTION Right 1960   TOE FUSION Left 1985 & 2004   "great toe" and removal   TONSILLECTOMY     TOTAL HIP ARTHROPLASTY Right 10/27/2015   Procedure: RIGHT TOTAL HIP ARTHROPLASTY ANTERIOR APPROACH and steroid injection right foot;  Surgeon: Kathryne Hitch, MD;  Location: [facility] OR;  Service: Orthopedics;  Laterality: Right;   TOTAL HIP ARTHROPLASTY Left 08/13/2019   TOTAL HIP ARTHROPLASTY Left 08/13/2019   Procedure: LEFT TOTAL HIP ARTHROPLASTY ANTERIOR APPROACH;  Surgeon: Kathryne Hitch, MD;  Location: [facility] OR;  Service: Orthopedics;  Laterality: Left;     A IV Location/Drains/Wounds Patient Lines/Drains/Airways Status     Active Line/Drains/Airways     Name Placement date Placement time Site Days   Peripheral IV 09/15/22 18 G Anterior;Left Forearm 09/15/22  1516  Forearm  less than 1   Epidural Catheter --  --  -- --   Incision (Closed) 02/22/17 Leg 02/22/17  2143  -- 2031   Incision (Closed) 03/17/17 Leg Left 03/17/17  0857  -- 2008   Incision (Closed) 03/17/17 Hip Left 03/17/17  1010  -- 2008   Incision (Closed) 08/13/19 Hip 08/13/19  1255  -- 1129            Intake/Output Last 24 hours No intake or output data in the 24 hours ending 09/15/22 1905  Labs/Imaging Results for orders placed or performed during the hospital encounter of 09/15/22 (from the past 48 hour(s))  CBC with Differential/Platelet     Status: Abnormal   Collection Time: 09/15/22  3:07 PM  Result Value Ref Range   WBC 7.0 4.0 - 10.5 K/uL   RBC 4.15 (L) 4.22 - 5.81 MIL/uL   Hemoglobin 13.1 13.0 - 17.0 g/dL   HCT 03.0 (L) 09.2 - 33.0 %   MCV 92.8 80.0 - 100.0 fL   MCH 31.6 26.0 - 34.0  pg   MCHC 34.0 30.0 - 36.0 g/dL   RDW 07.6 22.6 - 33.3 %   Platelets 141 (L) 150 - 400 K/uL   nRBC 0.0 0.0 - 0.2 %   Neutrophils Relative % 66 %   Neutro Abs 4.6 1.7 - 7.7 K/uL   Lymphocytes Relative 18 %   Lymphs Abs 1.3 0.7 - 4.0 K/uL   Monocytes Relative 13 %   Monocytes Absolute 0.9 0.1 - 1.0  K/uL   Eosinophils Relative 2 %   Eosinophils Absolute 0.1 0.0 - 0.5 K/uL   Basophils Relative 1 %   Basophils Absolute 0.1 0.0 - 0.1 K/uL   Immature Granulocytes 0 %   Abs Immature Granulocytes 0.03 0.00 - 0.07 K/uL    Comment: Performed at [facility], [address], [place], [place] [address]  Comprehensive metabolic panel     Status: Abnormal   Collection Time: 09/15/22  3:07 PM  Result Value Ref Range   Sodium 142 135 - 145 mmol/L   Potassium 2.8 (L) 3.5 - 5.1 mmol/L   Chloride 108 98 - 111 mmol/L   CO2 21 (L) 22 - 32 mmol/L   Glucose, Bld 126 (H) 70 - 99 mg/dL    Comment: Glucose reference range applies only to samples taken after fasting for at least 8 hours.   BUN 11 8 - 23 mg/dL   Creatinine, Ser 5.78 0.61 - 1.24 mg/dL   Calcium 7.8 (L) 8.9 - 10.3 mg/dL   Total Protein 5.0 (L) 6.5 - 8.1 g/dL   Albumin 3.0 (L) 3.5 - 5.0 g/dL   AST 20 15 - 41 U/L   ALT 22 0 - 44 U/L   Alkaline Phosphatase 46 38 - 126 U/L   Total Bilirubin 1.2 0.3 - 1.2 mg/dL   GFR, Estimated >46 >96 mL/min    Comment: (NOTE) Calculated using the CKD-EPI Creatinine Equation (2021)    Anion gap 13 5 - 15    Comment: Performed at [facility], [address] [place], [place] [address]  Troponin I (High Sensitivity)     Status: Abnormal   Collection Time: 09/15/22  3:07 PM  Result Value Ref Range   Troponin I (High Sensitivity) 29 (H) <18 ng/L    Comment: (NOTE) Elevated high sensitivity troponin I (hsTnI) values and significant  changes across serial measurements may suggest ACS but many other  chronic and acute conditions are known to elevate hsTnI results.  Refer to the "Links" section  for chest pain algorithms and additional  guidance. Performed at [facility], [address], [place], [place] [address]   Protime-INR     Status: None   Collection Time: 09/15/22  3:07 PM  Result Value Ref Range   Prothrombin Time 14.4 11.4 - 15.2 seconds   INR 1.1 0.8 - 1.2    Comment: (NOTE) INR goal varies based on device and disease states. Performed at [facility], [address], [place], [place] [address]    DG Chest Port 1 View  Result Date: 09/15/2022 CLINICAL DATA:  Provided history: Chest pain. AFib with RVR. EXAM: PORTABLE CHEST 1 VIEW COMPARISON:  Prior chest radiographs 04/20/2022 and earlier. FINDINGS: Prior median sternotomy, CABG and aortic valve replacement. Heart size within normal limits. Aortic atherosclerosis. Central pulmonary vascular congestion without overt pulmonary edema. No appreciable airspace consolidation. No evidence of pleural effusion or pneumothorax. No acute osseous abnormality identified. Chronic, healed right rib fractures. ACDF hardware. IMPRESSION: 1. Central pulmonary vascular congestion without overt pulmonary edema. 2.  Aortic Atherosclerosis (ICD10-I70.0). Electronically Signed   By: Jackey Loge D.O.   On: 09/15/2022 15:32    Pending Labs Unresulted Labs (From admission, onward)     Start     Ordered   09/16/22 0500  Comprehensive metabolic panel  Tomorrow morning,   R        09/15/22 1821   09/16/22 0500  CBC with Differential/Platelet  Tomorrow morning,   R        09/15/22 1821   09/15/22 1750  Magnesium  Add-on,   AD        09/15/22 1749            Vitals/Pain Today's Vitals   09/15/22 1530 09/15/22 1545 09/15/22 1600 09/15/22 1615  BP: 116/75 (!) 120/101 (!) 130/102 127/82  Pulse: (!) 51 (!) 111 (!) 54 (!) 117  Resp: (!) 21 (!) 26 18 (!) 23  Temp:      TempSrc:      SpO2: 97% 99% 99% 97%  Weight:      Height:      PainSc:        Isolation Precautions No active isolations  Medications Medications   potassium chloride SA (KLOR-CON M) CR tablet 40 mEq (has no administration in time range)  potassium chloride 10 mEq in 100 mL IVPB (has no administration in time range)  acetaminophen (TYLENOL) tablet 1,000 mg (has no administration in time range)  aspirin EC tablet 81 mg (0 mg Oral Hold 09/15/22 1811)  isosorbide mononitrate (IMDUR) 24 hr tablet 15 mg (15 mg Oral Not Given 09/15/22 1815)  primidone (MYSOLINE) tablet 50 mg (has no administration in time range)  furosemide (LASIX) tablet 40 mg (0 mg Oral Hold 09/15/22 1815)  allopurinol (ZYLOPRIM) tablet 300 mg (has no administration in time range)  tiZANidine (ZANAFLEX) tablet 2 mg (has no administration in time range)  levothyroxine (SYNTHROID) tablet 25 mcg (has no administration in time range)  famotidine (PEPCID) tablet 40 mg (0 mg Oral Hold 09/15/22 1814)  ascorbic acid (VITAMIN C) tablet 1,000 mg (0 mg Oral Hold 09/15/22 1814)  cholecalciferol (VITAMIN D3) 25 MCG (1000 UNIT) tablet 1,000 Units (has no administration in time range)    Mobility walks     Focused Assessments Cardiac Assessment Handoff:    Lab Results  Component Value Date   CKTOTAL 423 (H) 12/23/2008   CKMB 4.4 (H) 12/23/2008   TROPONINI 1.85 (HH) 03/26/2015   No results found for: "DDIMER" Does the Patient currently have chest pain? No    R Recommendations: See Admitting Provider Note  Report given to:   Additional Notes: meds are not given at this time due to awaiting pharacy and cards

## 2022-09-15 NOTE — Hospital Course (Signed)
82 year old white male CAD CABG AVR 2016 CYPD 2DS poor  metabolization meds-last cardiac cath 12/03/2021 stable HFpEF HTN hypothyroid DM TY 2 Gout right knee OSA on CPAP Paroxysmal A-fib CHADVASC >3 not on any anticoagulation nor on rate control presumably because of CYP 450 issues    CMET pending, INR 1.1 and is now on Coumadin CXR has some congestion   Hemoglobin 13 platelet 141 WBC 7 EKG my over read rate related ST-T wave depressions in lateral leads with A-fib compared to prior EKG 04/20/2022 normal except for rate change and rhythm change   In the ED started on Cardizem gtt.

## 2022-09-15 NOTE — H&P (Signed)
HPI  Troy Banks WUJ:[id number] DOB: [birth year] DOA: 09/15/2022  PCP: Corwin Levins, MD   Chief Complaint: Chest discomfort and chest heaviness  HPI:  82 year old white male CAD CABG AVR 2016 CYP 450 CYP2D6 poor  metabolization meds-last cardiac cath 12/03/2021 stable HFpEF HTN hypothyroid DM TY 2 Gout right knee Chronic migraines with no response to Botox Chronic low back pain follows with Dr. Magnus Ivan ESI injections Dr. Alvester Morin Essential tremor status post focused ultrasound to right VIM follows with Dr. Gladstone Pih there on primidone OSA on CPAP Paroxysmal A-fib CHADVASC >3 not on any anticoagulation nor on rate control presumably because of CYP 450 issues  Developed sudden onset chest heaviness with radiation to the back discomfort nausea early in the morning on 4/11-had difficulty getting up no chest pain no fever Has had angina last week and took!  6 NITROGLYCERIN!!  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, arts from [employer]] He continued to have discomfort could not hide this from his wife and ultimately decision was made to come to the ED  He was found to be in the EMS in A-fib with RVR given nitroglycerin X.3 ASA 325  Review of Systems:  - Fever - chills - cough - cold - blurred vision - double vision - strokelike symptoms - unilateral weakness - nausea currently  Pertinent +'s: Pertinent -"s:  ED Course: Patient was attempted to be loaded with Cardizem but patient and family declined this as they are scared of the interactions with the CYP 450 isoenzyme    Past Medical History:  Diagnosis Date   Allergy    Anxiety    PTSD   Aortic stenosis, moderate 03/27/2015   post AVR echo Echo 11/16: Mild LVH, EF 55-60%, normal wall motion, grade 1 diastolic dysfunction, bioprosthetic AVR okay (mean gradient 18 mmHg) with trivial AI, mild LAE, atrial septal aneurysm, small effusion    Arthritis    "qwhere" (10/29/2015)   Cancer (HCC)     skin cancer   Carotid artery stenosis    Carotid US 11/17: 1-39% bilateral ICA stenosis. F/u prn   Cervical stenosis of spinal canal    fusion 2006   CHF (congestive heart failure) (HCC)    "secondry to [facility]"   Closed left pilon fracture    COLONIC POLYPS, HX OF    Complication of anesthesia    "takes a lot to put him under and has woken up during surgery" Difficult to wake up . PTSD; "does not metabolize RX well"that he gets "violent", per pt.    Coronary artery disease    DIVERTICULOSIS, COLON    Dysrhythmia    2 bouts of afib post op and at home but corrected with medication.   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)    confirmed heterozygous 10/24/14 labs   PTSD (post-traumatic stress disorder)    PTSD (post-traumatic stress disorder)    Sleep apnea    On a CPAP   Past Surgical History:  Procedure Laterality Date   ANKLE FUSION Left 03/17/2017   Procedure: FUSION  PILON FRACTURE WITH BONE GRAFTING;  Surgeon: Roby LoftsHaddix, Kevin P, MD;  Location: [facility] OR;  Service: Orthopedics;  Laterality: Left;   ANTERIOR FUSION CERVICAL SPINE  2002   AORTIC VALVE REPLACEMENT N/A 03/30/2015   Procedure: AORTIC VALVE REPLACEMENT (AVR);  Surgeon: Delight OvensEdward B Gerhardt, MD;  [facility];  Service: Open Heart Surgery;  Laterality: N/A;   CARDIAC CATHETERIZATION N/A 03/26/2015   Procedure: Left Heart Cath and Coronary Angiography;  Surgeon: Marykay Lexavid W  Harding, MD;  [facility];  Service: Cardiovascular;  Laterality: N/A;   CARDIAC VALVE REPLACEMENT     COLONOSCOPY W/ POLYPECTOMY     CORONARY ARTERY BYPASS GRAFT N/A 03/30/2015   Procedure: CORONARY ARTERY BYPASS GRAFTING (CABG) x four, using left internal mammary artery and left    leg greater saphenous vein harvested endoscopically ;  Surgeon: Delight OvensEdward B Gerhardt, MD;  Location: [facility] OR;  Service: Open Heart Surgery;  Laterality: N/A;   CORONARY/GRAFT ANGIOGRAPHY N/A 12/03/2021   Procedure: CORONARY/GRAFT ANGIOGRAPHY;  Surgeon: SwazilandJordan, [name], MD;  [facility];  Service: Cardiovascular;  Laterality: N/A;   CYSTOSCOPY/URETEROSCOPY/HOLMIUM LASER/STENT PLACEMENT Bilateral 11/07/2016   Procedure: CYSTOSCOPY/URETEROSCOPY/HOLMIUM LASER/STENT PLACEMENT;  Surgeon: Hildred LaserBudzyn, Brian James, MD;  Location: [facility] ORS;  Service: Urology;  Laterality: Bilateral;   CYSTOSCOPY/URETEROSCOPY/HOLMIUM LASER/STENT PLACEMENT Bilateral 11/23/2016   Procedure: CYSTOSCOPY/BILATERAL URETEROSCOPY/HOLMIUM LASER/STENT EXCHANGE;  Surgeon: Hildred LaserBudzyn, Brian James, MD;  Location: [facility] ORS;  Service: Urology;  Laterality: Bilateral;  NEEDS 90 MIN    EXTERNAL FIXATION LEG Left 02/22/2017   Procedure: EXTERNAL FIXATION LEFT ANKLE;  Surgeon: Sheral ApleyMurphy, Timothy D, MD;  [facility];  Service: Orthopedics;  Laterality: Left;   HEMORRHOID BANDING  1970s   INGUINAL HERNIA REPAIR Left 1962   KIDNEY STONE SURGERY  1986   "cut me open"   LEFT HEART CATH AND CORS/GRAFTS ANGIOGRAPHY N/A 10/25/2018   Procedure: LEFT HEART CATH AND CORS/GRAFTS ANGIOGRAPHY;  Surgeon: Marykay LexHarding, David W, MD;  [facility];  Service: Cardiovascular;  Laterality: N/A;   LEFT HEART CATH AND CORS/GRAFTS ANGIOGRAPHY N/A 12/03/2021   Procedure: LEFT HEART CATH AND CORS/GRAFTS ANGIOGRAPHY;  Surgeon: SwazilandJordan, [name], MD;  [facility];  Service: Cardiovascular;  Laterality: N/A;   POSTERIOR FUSION CERVICAL SPINE  2006    TEE WITHOUT CARDIOVERSION N/A 03/30/2015   Procedure: TRANSESOPHAGEAL ECHOCARDIOGRAM (TEE);  Surgeon: Delight OvensEdward B Gerhardt, MD;  [facility];  Service: Open Heart Surgery;  Laterality: N/A;   TESTICLE TORSION REDUCTION Right 1960   TOE FUSION Left 1985 & 2004   "great toe" and removal   TONSILLECTOMY     TOTAL HIP ARTHROPLASTY Right 10/27/2015   Procedure: RIGHT TOTAL HIP ARTHROPLASTY ANTERIOR APPROACH and steroid injection right foot;  Surgeon: Kathryne Hitchhristopher Y Blackman, MD;  [facility] OR;  Service: Orthopedics;  Laterality: Right;   TOTAL HIP ARTHROPLASTY Left 08/13/2019   TOTAL HIP ARTHROPLASTY Left 08/13/2019   Procedure: LEFT TOTAL HIP ARTHROPLASTY ANTERIOR APPROACH;  Surgeon: Kathryne HitchBlackman, Christopher Y, MD;  Location: [facility] OR;  Service: Orthopedics;  Laterality: Left;    reports that he has been smoking cigarettes. He has a 34.00 pack-year  smoking history. He has never used smokeless tobacco. He reports current alcohol use of about 3.0 standard drinks of alcohol per week. He reports that he does not use drugs.  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    Compazine [Prochlorperazine Maleate] Other (See Comments)    Accumulates and causes stroke-like side-effects due to cytochrome P450 enzyme deficiency   Coreg [Carvedilol] Other (See Comments)    Accumulates and causes stroke-like side-effects due to cytochrome P450 enzyme deficiency Patient poor metabolizer of CYP2D6 - Coreg undergoes extensive hepatic (including 2D6)   Flomax [Tamsulosin Hcl] Other (See Comments)    Accumulates and causes stroke-like side-effects due to cytochrome P450 enzyme deficiency   Lisinopril Other (See Comments)     Hyperkalemia. 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   Mobic [Meloxicam] Other (See Comments)    Accumulates and causes stroke-like side-effects due to cytochrome P450 enzyme deficiency   Morphine And Related Other (See Comments)    Per pt's wife morphine also contraindicated due to p450 enzyme deficiency.     Ondansetron Other (See Comments)    Accumulates and causes stroke-like side-effects due to cytochrome P450 enzyme deficiency    Oxycodone Other (See Comments)    Accumulates and causes stroke-like side-effects due to cytochrome P450 enzyme deficiency   Promethazine Other (See Comments)    Accumulates and causes stroke-like side-effects due to cytochrome P450 enzyme deficiency   Sertraline Hcl Other (See Comments)    Accumulates and causes stroke-like side-effects due to cytochrome P450 enzyme deficiency   Tramadol Other (See Comments)    Accumulates and causes stroke-like side-effects due to cytochrome P450 enzyme deficiency   Atorvastatin Other (See Comments)    MYALGIAS   Colchicine Other (See Comments)    ataxia   Fentanyl Other (See Comments)    Accumulates and cause stroke like symptoms   Family History  Problem Relation Age of Onset   Heart disease Mother    Hypertension Mother    Diabetes Mother    Heart disease Father    Hypertension Father    Tremor Sister    Healthy Brother    Healthy Son    Prostate cancer Maternal Uncle    Hypertension Maternal Grandmother    Heart disease Maternal Grandmother    Hypertension Maternal Grandfather    Heart disease Maternal Grandfather    Hypertension Paternal Grandfather    Heart disease Paternal Grandfather    Diabetes Paternal Grandfather    Alcohol abuse Other    Arthritis Other    Drug abuse Sister    Healthy Daughter    Colon polyps Neg Hx    Esophageal cancer Neg Hx    Rectal cancer Neg Hx    Stomach cancer Neg Hx    Colon cancer Neg Hx    Prior to  Admission medications   Medication Sig Start Date End Date Taking? Authorizing Provider  acetaminophen (TYLENOL) 500 MG tablet Take 1,000 mg by mouth every 6 (six) hours as needed for moderate pain. 12/18/17  Yes [provider]  allopurinol (ZYLOPRIM) 300 MG tablet TAKE 1 TABLET BY MOUTH EVERY DAY Patient taking differently: Take 300 mg by mouth daily. 05/02/22  Yes Corwin Levins, MD  amLODipine (NORVASC) 10 MG tablet TAKE 1 TABLET BY MOUTH EVERY DAY Patient taking differently: Take 10 mg by mouth daily. 08/05/22  Yes [place], [name], MD  Ascorbic Acid (VITAMIN C) 1000 MG tablet Take 1,000 mg by mouth daily.   Yes [provider]  aspirin EC 81 MG tablet Take 81 mg by mouth every other day. Swallow whole.   Yes [provider]  calcium carbonate (TUMS EX) 750 MG chewable tablet Chew 1 tablet by mouth daily as needed for heartburn.    Yes [provider]  Carboxymethylcellulose Sodium (LUBRICANT EYE DROPS OP) Place 1 drop into both eyes 2 (two) times daily as needed (dry/irritated eyes.).    Yes [provider]  Cholecalciferol (VITAMIN D-3) 25 MCG (1000 UT) CAPS Take 1,000 Units by mouth every other day.   Yes [provider]  famotidine (PEPCID) 40 MG tablet Take 1 tablet (40 mg total) by mouth daily. 06/28/22  Yes Corwin Levins, MD  furosemide (LASIX) 40 MG tablet TAKE 1 TABLET BY MOUTH EVERY OTHER DAY Patient taking differently: Take 40 mg by mouth every other day. 05/20/22  Yes [place], [name], MD  isosorbide mononitrate (IMDUR) 30 MG 24 hr tablet Take 1 tablet (30 mg total) by mouth daily. 12/04/21  Yes [name], [name], [name]  levothyroxine (SYNTHROID) 25 MCG tablet TAKE 1 TABLET BY MOUTH EVERY DAY WITH BREAKFAST Patient taking differently: Take 25 mcg by mouth daily before breakfast. 06/29/21  Yes Corwin Levins, MD  nitroGLYCERIN (NITROSTAT) 0.4 MG SL tablet PLACE 1 TABLET UNDER THE TONGUE EVERY 5 MINUTES AS NEEDED FOR CHEST PAIN. Patient taking  differently: Place 0.4 mg under the tongue every 5 (five) minutes as needed for chest pain. 11/30/21  Yes [place], [name], MD  primidone (MYSOLINE) 50 MG tablet TAKE 1 TABLET BY MOUTH 2 TIMES DAILY 08/22/22  Yes [name], Octaviano Batty, DO  rosuvastatin (CRESTOR) 10 MG tablet TAKE 1/2 TABLET BY MOUTH EVERY DAY Patient taking differently: Take 5 mg by mouth every evening. 12/01/21  Yes Rollene Rotunda, MD  tiZANidine (ZANAFLEX) 4 MG tablet Take 2 mg by mouth every 8 (eight) hours as needed for muscle spasms. 12/18/17  Yes [provider]  Turmeric 500 MG CAPS Take 500 mg by mouth daily.    Yes [provider]  vitamin B-12 (CYANOCOBALAMIN) 1000 MCG tablet Take 1,000 mcg by mouth every other day.   Yes [provider]  ranitidine (ZANTAC) 150 MG tablet TAKE 1 TABLET BY MOUTH 2 TIMES DAILY Patient taking differently: No sig reported 09/05/18 12/13/18  Corwin Levins, MD    Physical Exam:  Vitals:   09/15/22 1600 09/15/22 1615  BP: (!) 130/102 127/82  Pulse: (!) 54 (!) 117  Resp: 18 (!) 23  Temp:    SpO2: 99% 97%    Coherent pleasant no distress no icterus no pallor arcus senilis Neck soft supple Mallampati 2 Smile symmetric Power 5/5 moving 4 limbs equally S1-S2 tachycardic-sinus menance data reveals underlying rhythm probably flutter 2-1--no carotid bruit Chest is clear no wheeze rales rhonchi Mild JVD noted No lower extremity edema Midline chest scar   I have personally reviewed following labs and imaging studies  Labs:  Potassium 2.8 CO2 21 BUN/creatinine 11/0.8 albumin 3.0 bilirubin 1.2 Hemoglobin 13 platelet 141 WBC 7 EKG my over read rate related ST-T wave depressions in lateral leads with A-fib compared to prior EKG 04/20/2022 normal except for rate change  and rhythm change     Imaging studies:  CXR has some congestion    Medical tests:  EKG independently reviewed: Looks like flutter/A-fib with rate related changes in lateral leads  Test discussed with  performing physician: Discussed with cardiology  Decision to obtain old records:  Yes  Review and summation of old records:  Yes  Active Problems:   * No active hospital problems. *   Assessment/Plan 2-1 flutter versus A-fib Sinus massage reveals however underlying flutter [this was done after auscultating carotids which did not seem to have bruit Discussed with pharmacist in detail and went over this with wife who is an ICU nurse in the past-amiodarone seems to have been the strategy used to control his heart rate postprocedure in 2016--- start amio bolus with gtt. and convert to tablets when rate is a little better controlled Correct electrolyte abnormality of the potassium 2.8 p.o. and add on a magnesium and correct if abnormal He will need to be started on dose adjusted Lovenox which and in the morning we will have to figure out which DOAC he can take  Stable angina in the setting of CAD in 2016 with AVR repair Dr. Tyrone Sage at the time Recent episodic use of 6 nitroglycerin takes Imdur 30 I have cut back the dose of Imdur to 15 given the propensity of the amnio IV to lower the blood pressure We we will cycle his troponins to ensure that he does not bump up significantly-- he is not having overt crushing chest pain at this time--although his presentation is a little concerning, he seems to be comfortable now---We will order nonemergent echocardiogram Given aspirin 324 on arrival and continue aspirin 81 mg for now Resume Crestor in a.m.  Severe hypokalemia Replace with p.o. K. Dur 40 twice daily follow magnesium and replace if needed Repeat labs in a.m.  Hypothyroid Do not check TSH at this time-rather check as an outpatient continue Synthroid 25 mcg daily  Essential tremor Continue primidone 1 tab 50 mg twice daily  OSA on CPAP Start auto titration device and if family wants they can bring his device from home and we can use that  HTN Hold amlodipine 10  Gout can  continue allopurinol 300 daily  DM TY 2 diet controlled A1c's 2 months ago was 6.9   Severity of Illness: The appropriate patient status for this patient is INPATIENT. Inpatient status is judged to be reasonable and necessary in order to provide the required intensity of service to ensure the patient's safety. The patient's presenting symptoms, physical exam findings, and initial radiographic and laboratory data in the context of their chronic comorbidities is felt to place them at high risk for further clinical deterioration. Furthermore, it is not anticipated that the patient will be medically stable for discharge from the hospital within 2 midnights of admission.   * I certify that at the point of admission it is my clinical judgment that the patient will require inpatient hospital care spanning beyond 2 midnights from the point of admission due to high intensity of service, high risk for further deterioration and high frequency of surveillance required.*   DVT prophylaxis: Therapeutic Lovenox Code Status: DNR confirmed at the bedside Family Communication: Wife Consults called: Cardiology  Time spent: 70 minutes  Mahala Menghini, MD Cordelia Poche my NP partners at night for Care related issues] [facility] --[employer] [employer] OR , [email or web address]; password [email or web address]  09/15/2022, 6:08 PM

## 2022-09-15 NOTE — Progress Notes (Signed)
Set pt up on CPAP with full face mask per patients home regimen.  Pt tolerating well at this time.

## 2022-09-15 NOTE — Consult Note (Addendum)
Cardiology Consultation   Patient ID: Troy Banks MRN: [id number]; DOB: June 09, 1940  Admit date: 09/15/2022 Date of Consult: 09/15/2022  PCP:  Corwin Levins, MD   [facility] Providers Cardiologist:  Peter Swaziland, MD     Patient Profile:   Troy Banks is a 82 y.o. 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 Mahala Menghini.  History of Present Illness:   Troy Banks is a 82 year old male with past medical history noted above.  He has been followed by Dr. Swaziland as an outpatient.  He was admitted 03/2059 with a non-STEMI.  Underwent left heart cath which demonstrated three-vessel CAD with significant aortic stenosis.  He underwent CABG with LIMA to LAD, SVG to intermediate, SVG to circumflex, SVG to distal RCA and bioprosthetic AVR.  He was placed on atenolol initially but this was later stopped secondary to bradycardia.  He developed postop atrial fibrillation was placed on amiodarone and return to sinus rhythm.  Is known to have essential tremor and is on primidone chronically.  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  His amiodarone was stopped in July 2017 when he was hospitalized for an acute gout attack in his left knee as he was maintaining sinus rhythm.  Had an outpatient Myoview study in May 2018 which was normal.  May 2020 presented with symptoms of dyspnea on exertion and underwent cardiac cath showing patent grafts.  Echo showed normal EF without evidence of diastolic dysfunction.  Stable AVR prosthesis function.  Mild pulmonary hypertension.  Presented 11/2021 with chest pain unrelieved by nitroglycerin.  Underwent repeat cardiac catheterization which  revealed patent bypass grafts with medical therapy recommended and isosorbide was added to his medication regimen.  LVEF of 55 to 60%, mild LVH, mildly reduced RV function, mild RV enlargement, moderate dilation of the left atrium, mild MR, stable bioprosthetic aortic valve with mild aortic stenosis.  Last seen in the office on 05/2022 and reported doing well at this visit.  He was continued on medications without significant changes.   Presented to the ED on 4/11 with complaints of centralized chest pain and shortness of breath.  States earlier this morning he went for a veterans physical exam and felt well.  Later after getting home he was sitting resting and developed centralized chest pain around 10 AM.  Ultimately presented to the ED for further evaluation.  Labs in the ED showed sodium 142, potassium 2.8, creatinine 0.8, albumin 3, high-sensitivity troponin 29, WBC 7, hemoglobin 13.1.  EKG showed atrial fibrillation with RVR, 137 bpm.  Chest x-ray with pulmonary vascular congestion without overt edema.  Cardiology asked to consult for further management.   Past Medical History:  Diagnosis Date   Allergy    Anxiety    PTSD   Aortic stenosis, moderate 03/27/2015   post AVR echo Echo 11/16: Mild LVH, EF 55-60%, normal wall motion, grade 1 diastolic dysfunction, bioprosthetic AVR okay (mean gradient 18 mmHg) with trivial AI, mild LAE, atrial septal aneurysm, small effusion    Arthritis    "qwhere" (10/29/2015)   Cancer (HCC)    skin cancer   Carotid artery stenosis    Carotid US 11/17: 1-39% bilateral ICA stenosis. F/u prn   Cervical  stenosis of spinal canal    fusion 2006   CHF (congestive heart failure) (HCC)    "secondry to [facility]"   Closed left pilon fracture    COLONIC POLYPS, HX OF    Complication of anesthesia    "takes a lot to put him under and has woken up during surgery" Difficult to wake up . PTSD; "does not metabolize RX well"that he gets "violent", per pt.    Coronary artery  disease    DIVERTICULOSIS, COLON    Dysrhythmia    2 bouts of afib post op and at home but corrected with medication.   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)    confirmed heterozygous 10/24/14 labs   PTSD (post-traumatic stress disorder)    PTSD (post-traumatic stress disorder)    Sleep apnea    On a CPAP    Past Surgical History:  Procedure Laterality Date   ANKLE FUSION Left 03/17/2017   Procedure: FUSION  PILON FRACTURE WITH BONE GRAFTING;  Surgeon: Roby Lofts, MD;  Location: [facility] OR;  Service: Orthopedics;  Laterality: Left;   ANTERIOR FUSION CERVICAL SPINE  2002   AORTIC VALVE REPLACEMENT N/A 03/30/2015   Procedure: AORTIC VALVE REPLACEMENT (AVR);  Surgeon: Delight Ovens, MD;  Location: [facility] OR;  Service: Open Heart Surgery;  Laterality: N/A;   CARDIAC CATHETERIZATION N/A 03/26/2015   Procedure: Left Heart Cath and Coronary Angiography;  Surgeon: Marykay Lex, MD;  Location: [facility] INVASIVE CV LAB;  Service: Cardiovascular;  Laterality: N/A;   CARDIAC VALVE REPLACEMENT     COLONOSCOPY W/ POLYPECTOMY     CORONARY ARTERY BYPASS GRAFT N/A 03/30/2015   Procedure: CORONARY ARTERY BYPASS GRAFTING (CABG) x four, using left internal mammary artery and left    leg greater saphenous vein harvested endoscopically ;  Surgeon: Delight Ovens, MD;  Location: [facility] OR;  Service: Open Heart Surgery;  Laterality: N/A;   CORONARY/GRAFT  ANGIOGRAPHY N/A 12/03/2021   Procedure: CORONARY/GRAFT ANGIOGRAPHY;  Surgeon: Swaziland, [name], MD;  Location: [facility] INVASIVE CV LAB;  Service: Cardiovascular;  Laterality: N/A;   CYSTOSCOPY/URETEROSCOPY/HOLMIUM LASER/STENT PLACEMENT Bilateral 11/07/2016   Procedure: CYSTOSCOPY/URETEROSCOPY/HOLMIUM LASER/STENT PLACEMENT;  Surgeon: Hildred Laser, MD;  Location: [facility] ORS;  Service: Urology;  Laterality: Bilateral;   CYSTOSCOPY/URETEROSCOPY/HOLMIUM LASER/STENT PLACEMENT Bilateral 11/23/2016   Procedure: CYSTOSCOPY/BILATERAL URETEROSCOPY/HOLMIUM LASER/STENT EXCHANGE;  Surgeon: Hildred Laser, MD;  Location: [facility] ORS;  Service: Urology;  Laterality: Bilateral;  NEEDS 90 MIN    EXTERNAL FIXATION LEG Left 02/22/2017   Procedure: EXTERNAL FIXATION LEFT ANKLE;  Surgeon: Sheral Apley, MD;  Location: [facility] OR;  Service: Orthopedics;  Laterality: Left;   HEMORRHOID BANDING  1970s   INGUINAL HERNIA REPAIR Left 1962   KIDNEY STONE SURGERY  1986   "cut me open"  LEFT HEART CATH AND CORS/GRAFTS ANGIOGRAPHY N/A 10/25/2018   Procedure: LEFT HEART CATH AND CORS/GRAFTS ANGIOGRAPHY;  Surgeon: Marykay LexHarding, David W, MD;  [facility];  Service: Cardiovascular;  Laterality: N/A;   LEFT HEART CATH AND CORS/GRAFTS ANGIOGRAPHY N/A 12/03/2021   Procedure: LEFT HEART CATH AND CORS/GRAFTS ANGIOGRAPHY;  Surgeon: SwazilandJordan, [name], MD;  [facility];  Service: Cardiovascular;  Laterality: N/A;   POSTERIOR FUSION CERVICAL SPINE  2006   TEE WITHOUT CARDIOVERSION N/A 03/30/2015   Procedure: TRANSESOPHAGEAL ECHOCARDIOGRAM (TEE);  Surgeon: Delight OvensEdward B Gerhardt, MD;  [facility];  Service: Open Heart Surgery;  Laterality: N/A;   TESTICLE TORSION REDUCTION Right 1960   TOE FUSION Left 1985 & 2004   "great toe" and removal   TONSILLECTOMY     TOTAL HIP ARTHROPLASTY Right 10/27/2015   Procedure: RIGHT TOTAL HIP ARTHROPLASTY ANTERIOR APPROACH and steroid injection right foot;  Surgeon: Kathryne Hitchhristopher Y  Blackman, MD;  Location: [facility] OR;  Service: Orthopedics;  Laterality: Right;   TOTAL HIP ARTHROPLASTY Left 08/13/2019   TOTAL HIP ARTHROPLASTY Left 08/13/2019   Procedure: LEFT TOTAL HIP ARTHROPLASTY ANTERIOR APPROACH;  Surgeon: Kathryne HitchBlackman, Christopher Y, MD;  Location: [facility] OR;  Service: Orthopedics;  Laterality: Left;     Home Medications:  Prior to Admission medications   Medication Sig Start Date End Date Taking? Authorizing Provider  acetaminophen (TYLENOL) 500 MG tablet Take 1,000 mg by mouth every 6 (six) hours as needed for moderate pain. 12/18/17  Yes [provider]  allopurinol (ZYLOPRIM) 300 MG tablet TAKE 1 TABLET BY MOUTH EVERY DAY Patient taking differently: Take 300 mg by mouth daily. 05/02/22  Yes Corwin LevinsJohn, James W, MD  amLODipine (NORVASC) 10 MG tablet TAKE 1 TABLET BY MOUTH EVERY DAY Patient taking differently: Take 10 mg by mouth daily. 08/05/22  Yes SwazilandJordan, [name], MD  Ascorbic Acid (VITAMIN C) 1000 MG tablet Take 1,000 mg by mouth daily.   Yes [provider]  aspirin EC 81 MG tablet Take 81 mg by mouth every other day. Swallow whole.   Yes [provider]  calcium carbonate (TUMS EX) 750 MG chewable tablet Chew 1 tablet by mouth daily as needed for heartburn.    Yes [provider]  Carboxymethylcellulose Sodium (LUBRICANT EYE DROPS OP) Place 1 drop into both eyes 2 (two) times daily as needed (dry/irritated eyes.).    Yes [provider]  Cholecalciferol (VITAMIN D-3) 25 MCG (1000 UT) CAPS Take 1,000 Units by mouth every other day.   Yes [provider]  famotidine (PEPCID) 40 MG tablet Take 1 tablet (40 mg total) by mouth daily. 06/28/22  Yes Corwin LevinsJohn, James W, MD  furosemide (LASIX) 40 MG tablet TAKE 1 TABLET BY MOUTH EVERY OTHER DAY Patient taking differently: Take 40 mg by mouth every other day. 05/20/22  Yes SwazilandJordan, [name], MD  isosorbide mononitrate (IMDUR) 30 MG 24 hr tablet Take 1 tablet (30 mg total) by mouth daily. 12/04/21   Yes [name], [name], [name]  levothyroxine (SYNTHROID) 25 MCG tablet TAKE 1 TABLET BY MOUTH EVERY DAY WITH BREAKFAST Patient taking differently: Take 25 mcg by mouth daily before breakfast. 06/29/21  Yes Corwin LevinsJohn, James W, MD  nitroGLYCERIN (NITROSTAT) 0.4 MG SL tablet PLACE 1 TABLET UNDER THE TONGUE EVERY 5 MINUTES AS NEEDED FOR CHEST PAIN. Patient taking differently: Place 0.4 mg under the tongue every 5 (five) minutes as needed for chest pain. 11/30/21  Yes SwazilandJordan, [name], MD  primidone (  MYSOLINE) 50 MG tablet TAKE 1 TABLET BY MOUTH 2 TIMES DAILY 08/22/22  Yes [name], Lurena Joiner S, DO  rosuvastatin (CRESTOR) 10 MG tablet TAKE 1/2 TABLET BY MOUTH EVERY DAY Patient taking differently: Take 5 mg by mouth every evening. 12/01/21  Yes Rollene Rotunda, MD  tiZANidine (ZANAFLEX) 4 MG tablet Take 2 mg by mouth every 8 (eight) hours as needed for muscle spasms. 12/18/17  Yes [provider]  Turmeric 500 MG CAPS Take 500 mg by mouth daily.    Yes [provider]  vitamin B-12 (CYANOCOBALAMIN) 1000 MCG tablet Take 1,000 mcg by mouth every other day.   Yes [provider]  ranitidine (ZANTAC) 150 MG tablet TAKE 1 TABLET BY MOUTH 2 TIMES DAILY Patient taking differently: No sig reported 09/05/18 12/13/18  Corwin Levins, MD    Inpatient Medications: Scheduled Meds:  [START ON 09/16/2022] allopurinol  300 mg Oral Daily   [START ON 09/16/2022] vitamin C  1,000 mg Oral Daily   aspirin EC  81 mg Oral QODAY   [START ON 09/16/2022] cholecalciferol  1,000 Units Oral QODAY   [START ON 09/16/2022] famotidine  40 mg Oral Daily   [START ON 09/16/2022] furosemide  40 mg Oral QODAY   heparin  5,000 Units Intravenous Once   [START ON 09/16/2022] isosorbide mononitrate  15 mg Oral Daily   [START ON 09/16/2022] levothyroxine  25 mcg Oral QAC breakfast   potassium chloride  40 mEq Oral BID   primidone  50 mg Oral BID   Continuous Infusions:  diltiazem (CARDIZEM) infusion     heparin     potassium chloride      PRN Meds: acetaminophen, tiZANidine  Allergies:    Allergies  Allergen Reactions   Acyclovir And Related Other (See Comments)    Accumulates and causes stroke-like side-effects due to cytochrome P450 enzyme deficiency   Benzodiazepines Other (See Comments)    Patient states he gets stroke symptoms with benzo's.    Compazine [Prochlorperazine Maleate] Other (See Comments)    Accumulates and causes stroke-like side-effects due to cytochrome P450 enzyme deficiency   Coreg [Carvedilol] Other (See Comments)    Accumulates and causes stroke-like side-effects due to cytochrome P450 enzyme deficiency Patient poor metabolizer of CYP2D6 - Coreg undergoes extensive hepatic (including 2D6)   Flomax [Tamsulosin Hcl] Other (See Comments)    Accumulates and causes stroke-like side-effects due to cytochrome P450 enzyme deficiency   Lisinopril Other (See Comments)    Hyperkalemia. 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   Mobic [Meloxicam] Other (See Comments)    Accumulates and causes stroke-like side-effects due to cytochrome P450 enzyme deficiency   Morphine And Related Other (See Comments)    Per pt's wife morphine also contraindicated due to p450 enzyme deficiency.     Ondansetron Other (See Comments)    Accumulates and causes stroke-like side-effects due to cytochrome P450 enzyme deficiency    Oxycodone Other (See Comments)    Accumulates and causes stroke-like side-effects due to cytochrome P450 enzyme deficiency   Promethazine Other (See Comments)    Accumulates and causes stroke-like side-effects due to cytochrome P450 enzyme deficiency   Sertraline Hcl Other (See Comments)    Accumulates and causes stroke-like side-effects due to cytochrome P450 enzyme deficiency   Tramadol Other (See Comments)    Accumulates and causes stroke-like side-effects due to cytochrome P450 enzyme deficiency   Atorvastatin Other (See Comments)    MYALGIAS    Colchicine Other (See Comments)  ataxia   Fentanyl Other (See Comments)    Accumulates and cause stroke like symptoms    Social History:   Social History   Socioeconomic History   Marital status: Married    Spouse name: Troy Baxter, RN   Number of children: 2   Years of education: Not on file   Highest education level: Some college, no degree  Occupational History   Occupation: Retired Economist  Tobacco Use   Smoking status: Some Days    Packs/day: 1.00    Years: 34.00    Additional pack years: 0.00    Total pack years: 34.00    Types: Cigarettes    Last attempt to quit: 06/06/1990    Years since quitting: 32.2   Smokeless tobacco: Never  Vaping Use   Vaping Use: Never used  Substance and Sexual Activity   Alcohol use: Yes    Alcohol/week: 3.0 standard drinks of alcohol    Types: 1 Cans of beer, 2 Shots of liquor per week    Comment: occassional   Drug use: No   Sexual activity: Yes  Other Topics Concern   Not on file  Social History Narrative   Left Handed    One Story home    Social Determinants of Health   Financial Resource Strain: Low Risk  (09/01/2022)   Overall Financial Resource Strain (CARDIA)    Difficulty of Paying Living Expenses: Not hard at all  Food Insecurity: No Food Insecurity (09/15/2022)   Hunger Vital Sign    Worried About Running Out of Food in the Last Year: Never true    Ran Out of Food in the Last Year: Never true  Transportation Needs: No Transportation Needs (09/15/2022)   PRAPARE - Administrator, Civil Service (Medical): No    Lack of Transportation (Non-Medical): No  Physical Activity: Inactive (09/01/2022)   Exercise Vital Sign    Days of Exercise per Week: 0 days    Minutes of Exercise per Session: 0 min  Stress: Stress Concern Present (09/01/2022)   [employer] of Occupational Health - Occupational Stress Questionnaire    Feeling of Stress : To some extent  Social Connections: Unknown  (09/01/2022)   Social Connection and Isolation Panel [NHANES]    Frequency of Communication with Friends and Family: More than three times a week    Frequency of Social Gatherings with Friends and Family: Twice a week    Attends Religious Services: Not on Marketing executive or Organizations: Yes    Attends Banker Meetings: More than 4 times per year    Marital Status: Married  Catering manager Violence: Not At Risk (09/15/2022)   Humiliation, Afraid, Rape, and Kick questionnaire    Fear of Current or Ex-Partner: No    Emotionally Abused: No    Physically Abused: No    Sexually Abused: No    Family History:    Family History  Problem Relation Age of Onset   Heart disease Mother    Hypertension Mother    Diabetes Mother    Heart disease Father    Hypertension Father    Tremor Sister    Healthy Brother    Healthy Son    Prostate cancer Maternal Uncle    Hypertension Maternal Grandmother    Heart disease Maternal Grandmother    Hypertension Maternal Grandfather    Heart disease Maternal Grandfather    Hypertension Paternal Grandfather    Heart disease  Paternal Grandfather    Diabetes Paternal Grandfather    Alcohol abuse Other    Arthritis Other    Drug abuse Sister    Healthy Daughter    Colon polyps Neg Hx    Esophageal cancer Neg Hx    Rectal cancer Neg Hx    Stomach cancer Neg Hx    Colon cancer Neg Hx      ROS:  Please see the history of present illness.   All other ROS reviewed and negative.     Physical Exam/Data:   Vitals:   09/15/22 1845 09/15/22 1900 09/15/22 1912 09/15/22 1915  BP: (!) 156/114 (!) 140/102  (!) 138/114  Pulse: (!) 152   (!) 129  Resp: (!) 21 (!) 22  (!) 25  Temp:   98.2 F (36.8 C)   TempSrc:      SpO2: 100% 99%  96%  Weight:      Height:       No intake or output data in the 24 hours ending 09/15/22 2010    09/15/2022    3:21 PM 09/01/2022    3:29 PM 06/28/2022   10:28 AM  Last 3 Weights  Weight (lbs)  211 lb 10.3 oz 210 lb 210 lb  Weight (kg) 96 kg 95.255 kg 95.255 kg     Body mass index is 32.18 kg/m.  General:  Well nourished, well developed, in no acute distress HEENT: normal Neck: no JVD Vascular: No carotid bruits; Distal pulses 2+ bilaterally Cardiac:  normal S1, S2; irregularly irregular; 2/6 systolic murmur  Lungs:  clear to auscultation bilaterally, no wheezing, rhonchi or rales  Abd: soft, nontender, no hepatomegaly  Ext: no edema Musculoskeletal:  No deformities, BUE and BLE strength normal and equal Skin: warm and dry  Neuro:  CNs 2-12 intact, no focal abnormalities noted Psych:  Normal affect   EKG:  The EKG was personally reviewed and demonstrates: Atrial fibrillation with RVR, 137 bpm. Telemetry:  Telemetry was personally reviewed and demonstrates: Atrial fibrillation, rates 110-150  Relevant CV Studies:  Echo: 12/2021  IMPRESSIONS     1. Left ventricular ejection fraction, by estimation, is 55 to 60%. The  left ventricle has normal function. The left ventricle has no regional  wall motion abnormalities. There is mild left ventricular hypertrophy.  Left ventricular diastolic parameters  were normal.   2. Right ventricular systolic function is mildly reduced. The right  ventricular size is mildly enlarged.   3. Left atrial size was moderately dilated.   4. Right atrial size was mildly dilated.   5. The mitral valve is abnormal. Mild mitral valve regurgitation. No  evidence of mitral stenosis. Moderate mitral annular calcification.   6. 23 mm Edwards porcine bioprosthetic valve no AR mean gradient improved  since TTE done 11/1818 19.2-> 16.5 mmHg. The aortic valve has been  repaired/replaced. There is mild calcification of the aortic valve. There  is mild thickening of the aortic valve.   Aortic valve regurgitation is not visualized. Mild aortic valve stenosis.  There is a 23 mm Edwards porcine valve present in the aortic position.   7. The inferior vena  cava is normal in size with greater than 50%  respiratory variability, suggesting right atrial pressure of 3 mmHg.   FINDINGS   Left Ventricle: Left ventricular ejection fraction, by estimation, is 55  to 60%. The left ventricle has normal function. The left ventricle has no  regional wall motion abnormalities. The left ventricular internal cavity  size  was normal in size. There is   mild left ventricular hypertrophy. Left ventricular diastolic parameters  were normal.   Right Ventricle: The right ventricular size is mildly enlarged. Right  vetricular wall thickness was not assessed. Right ventricular systolic  function is mildly reduced.   Left Atrium: Left atrial size was moderately dilated.   Right Atrium: Right atrial size was mildly dilated.   Pericardium: There is no evidence of pericardial effusion.   Mitral Valve: The mitral valve is abnormal. There is moderate thickening  of the mitral valve leaflet(s). There is moderate calcification of the  mitral valve leaflet(s). Moderate mitral annular calcification. Mild  mitral valve regurgitation. No evidence  of mitral valve stenosis.   Tricuspid Valve: The tricuspid valve is normal in structure. Tricuspid  valve regurgitation is mild . No evidence of tricuspid stenosis.   Aortic Valve: 23 mm Edwards porcine bioprosthetic valve no AR mean  gradient improved since TTE done 11/1818 19.2-> 16.5 mmHg. The aortic valve  has been repaired/replaced. There is mild calcification of the aortic  valve. There is mild thickening of the  aortic valve. Aortic valve regurgitation is not visualized. Mild aortic  stenosis is present. Aortic valve mean gradient measures 16.5 mmHg. Aortic  valve peak gradient measures 29.7 mmHg. Aortic valve area, by VTI measures  1.45 cm. There is a 23 mm  Edwards porcine valve present in the aortic position.   Pulmonic Valve: The pulmonic valve was normal in structure. Pulmonic valve  regurgitation is not  visualized. No evidence of pulmonic stenosis.   Aorta: The aortic root is normal in size and structure.   Venous: The inferior vena cava is normal in size with greater than 50%  respiratory variability, suggesting right atrial pressure of 3 mmHg.   IAS/Shunts: No atrial level shunt detected by color flow Doppler.   Laboratory Data:  High Sensitivity Troponin:   Recent Labs  Lab 09/15/22 1507  TROPONINIHS 29*     Chemistry Recent Labs  Lab 09/15/22 1507  NA 142  K 2.8*  CL 108  CO2 21*  GLUCOSE 126*  BUN 11  CREATININE 0.80  CALCIUM 7.8*  GFRNONAA >60  ANIONGAP 13    Recent Labs  Lab 09/15/22 1507  PROT 5.0*  ALBUMIN 3.0*  AST 20  ALT 22  ALKPHOS 46  BILITOT 1.2   Lipids No results for input(s): "CHOL", "TRIG", "HDL", "LABVLDL", "LDLCALC", "CHOLHDL" in the last 168 hours.  Hematology Recent Labs  Lab 09/15/22 1507  WBC 7.0  RBC 4.15*  HGB 13.1  HCT 38.5*  MCV 92.8  MCH 31.6  MCHC 34.0  RDW 13.8  PLT 141*   Thyroid No results for input(s): "TSH", "FREET4" in the last 168 hours.  BNPNo results for input(s): "BNP", "PROBNP" in the last 168 hours.  DDimer No results for input(s): "DDIMER" in the last 168 hours.   Radiology/Studies:  [facility] Chest Port 1 View  Result Date: 09/15/2022 CLINICAL DATA:  Provided history: Chest pain. AFib with RVR. EXAM: PORTABLE CHEST 1 VIEW COMPARISON:  Prior chest radiographs 04/20/2022 and earlier. FINDINGS: Prior median sternotomy, CABG and aortic valve replacement. Heart size within normal limits. Aortic atherosclerosis. Central pulmonary vascular congestion without overt pulmonary edema. No appreciable airspace consolidation. No evidence of pleural effusion or pneumothorax. No acute osseous abnormality identified. Chronic, healed right rib fractures. ACDF hardware. IMPRESSION: 1. Central pulmonary vascular congestion without overt pulmonary edema. 2.  Aortic Atherosclerosis (ICD10-I70.0). Electronically Signed   By: Jackey Loge  D.O.   On: 09/15/2022 15:32     Assessment and Plan:   Troy Banks is a 82 y.o. 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 Mahala Menghini.  Atrial fibrillation with RVR -- Presented with new onset atrial fibrillation, duration is unclear.  Did have episode of postop atrial fibrillation back in 2016.  Was on amiodarone until January 2017 as he was maintaining sinus rhythm and this was stopped.  Given his history of cytochrome p450 CYP 2D6 genetic variant rate controlling strategy is difficult.  After discussion with Pharm.D. and patient, most appropriate therapy seems to be IV diltiazem.  Would avoid IV amiodarone in the setting of unclear duration of atrial fibrillation and lack of anticoagulation prior to admission. -- Start IV diltiazem, no bolus as he is hemodynamically stable in order to monitor rhythm for any complications -- IV heparin, will need to further discuss oral anticoagulation -- Discussed option for TEE/DCCV in the event that he does not cardiovert or become rate controlled during admission  -- check Echocardiogram  Chest pain CAD status post four-vessel CABG '16 -- Does report chest discomfort, denies palpitations this is in the setting of new onset atrial fibrillation -- Initial high-sensitivity troponin 27, continue to cycle -- IV heparin for now with A-fib until clear no invasive procedures planned  Hypokalemia -- Potassium 2.8, does take Lasix at 40 mg every other day.  Has not needed a potassium supplement historically -- Aggressive supplementation   Hypertension -- Blood pressures elevated in the ED. Will start with IV diltiazem for now -- hold amlodipine  Hyperlipidemia -- Continue statin  Hypothyroidism --  Continue Synthroid  Tremors -- On primidone   Risk Assessment/Risk Scores:   CHA2DS2-VASc Score = 5  This indicates a 7.2% annual risk of stroke. The patient's score is based upon: CHF History: 0 HTN History: 1 Diabetes History: 1 Stroke History: 0 Vascular Disease History: 1 Age Score: 2 Gender Score: 0    For questions or updates, please contact [facility] Please consult [email or web address] for contact info under    Signed, Laverda Page, NP  09/15/2022 8:10 PM  Patient seen and examined with Geoffry Paradise, NP.  Agree as above, with the following exceptions and changes as noted below.  Patient is a 82 year old male who I have met on a previous admission who presents with atrial fibrillation with rapid ventricular response. Gen: NAD, CV: Irregular and tachycardic lungs: clear, Abd: soft, Extrem: Warm, well perfused, no edema, Neuro/Psych: alert and oriented x 3, normal mood and affect. All available labs, radiology testing, previous records reviewed.  Patient seen with his wife at the bedside.  Wife is a former CCU nurse and has a good understanding of the cardiac conditions at play.  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  I have reviewed extensively with the pharmacists to determine appropriate drugs for use.  Though amiodarone was initially prescribed by the primary service, my primary concern with amiodarone is that the patient is not currently anticoagulated and chemical cardioversion without known duration of atrial fibrillation could pose undue stroke risk.  We will instead proceed with diltiazem and IV heparin for anticoagulation.  Likely convert to oral anticoagulant tomorrow morning.  Diltiazem may be requiring dosing  that is lower given partial metabolism with 2D6.  Will monitor closely.  Fortunately the patient is hypertensive at the moment and not in extremis.  May necessitate TEE cardioversion in a few days  after appropriate anticoagulation.  A full review of medications and interactions should be performed in AM with pharmacist prior to any initiation of any new medications including anticoagulation given patient's cytochrome P450 CYP 2D6 variant, as well to cross check all current medications.   Parke Poisson, MD 09/15/22 10:06 PM

## 2022-09-15 NOTE — ED Provider Notes (Addendum)
Signed out by Dr Anitra Lauth that pt with hx cad and afib (one prior episode) and possible ED cardioversion if coumadin therapeutic, otherwise admission for rate control, anticoag, etc.    INR is 1.1. on discussion with patient and wife he is not on coumadin and only takes a baby asa a day.  Patient does not know when afib started or how long present this episode - therefore not appropriate for ED cardioversion.  Chadsvasc 5.  Medicine consulted for admission.  Given p450 issue/concern, will also consult cardiology to assist w management afib.  Discussed w cardiology on call - they will see, and also rec pharmacy consult - placed.   Given hr currently 150s, will start low dose cardizem bolus/gtt pending hospitalists admission and cardiology consult.       Cathren Laine, MD 09/15/22 [phone]

## 2022-09-15 NOTE — Progress Notes (Signed)
Pt admitted from ed with spouse at the bedside HR 160's Afib, call bell within reach chg bath given, questions answered plan of care discussed

## 2022-09-15 NOTE — Progress Notes (Signed)
ANTICOAGULATION CONSULT NOTE - Initial Consult  Pharmacy Consult for heparin Indication: atrial fibrillation  Allergies  Allergen Reactions   Acyclovir And Related Other (See Comments)    Accumulates and causes stroke-like side-effects due to cytochrome P450 enzyme deficiency   Benzodiazepines Other (See Comments)    Patient states he gets stroke symptoms with benzo's.    Compazine [Prochlorperazine Maleate] Other (See Comments)    Accumulates and causes stroke-like side-effects due to cytochrome P450 enzyme deficiency   Coreg [Carvedilol] Other (See Comments)    Accumulates and causes stroke-like side-effects due to cytochrome P450 enzyme deficiency Patient poor metabolizer of CYP2D6 - Coreg undergoes extensive hepatic (including 2D6)   Flomax [Tamsulosin Hcl] Other (See Comments)    Accumulates and causes stroke-like side-effects due to cytochrome P450 enzyme deficiency   Lisinopril Other (See Comments)    Hyperkalemia. 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   Mobic [Meloxicam] Other (See Comments)    Accumulates and causes stroke-like side-effects due to cytochrome P450 enzyme deficiency   Morphine And Related Other (See Comments)    Per pt's wife morphine also contraindicated due to p450 enzyme deficiency.     Ondansetron Other (See Comments)    Accumulates and causes stroke-like side-effects due to cytochrome P450 enzyme deficiency    Oxycodone Other (See Comments)    Accumulates and causes stroke-like side-effects due to cytochrome P450 enzyme deficiency   Promethazine Other (See Comments)    Accumulates and causes stroke-like side-effects due to cytochrome P450 enzyme deficiency   Sertraline Hcl Other (See Comments)    Accumulates and causes stroke-like side-effects due to cytochrome P450 enzyme deficiency   Tramadol Other (See Comments)    Accumulates and causes stroke-like side-effects due to cytochrome P450 enzyme  deficiency   Atorvastatin Other (See Comments)    MYALGIAS   Colchicine Other (See Comments)    ataxia   Fentanyl Other (See Comments)    Accumulates and cause stroke like symptoms    Patient Measurements: Height: 5\' 8"  (172.7 cm) Weight: 96 kg (211 lb 10.3 oz) IBW/kg (Calculated) : 68.4 Heparin Dosing Weight: 88.7 kg  Vital Signs: Temp: 98.2 F (36.8 C) (04/11 1912) Temp Source: Oral (04/11 1442) BP: 138/114 (04/11 1915) Pulse Rate: 129 (04/11 1915)  Labs: Recent Labs    09/15/22 1507  HGB 13.1  HCT 38.5*  PLT 141*  LABPROT 14.4  INR 1.1  CREATININE 0.80  TROPONINIHS 29*    Estimated Creatinine Clearance: 84.1 mL/min (by C-G formula based on SCr of 0.8 mg/dL).   Medical History: Past Medical History:  Diagnosis Date   Allergy    Anxiety    PTSD   Aortic stenosis, moderate 03/27/2015   post AVR echo Echo 11/16: Mild LVH, EF 55-60%, normal wall motion, grade 1 diastolic dysfunction, bioprosthetic AVR okay (mean gradient 18 mmHg) with trivial AI, mild LAE, atrial septal aneurysm, small effusion    Arthritis    "qwhere" (10/29/2015)   Cancer (HCC)    skin cancer   Carotid artery stenosis    Carotid US 11/17: 1-39% bilateral ICA stenosis. F/u prn   Cervical stenosis of spinal canal    fusion 2006   CHF (congestive heart failure) (HCC)    "secondry to [facility]"   Closed left pilon fracture    COLONIC POLYPS, HX OF    Complication of anesthesia    "takes a lot to put him under and has woken up during surgery" Difficult to wake up . PTSD; "  does not metabolize RX well"that he gets "violent", per pt.    Coronary artery disease    DIVERTICULOSIS, COLON    Dysrhythmia    2 bouts of afib post op and at home but corrected with medication.   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)    confirmed heterozygous 10/24/14 labs   PTSD (post-traumatic stress disorder)    PTSD (post-traumatic stress disorder)    Sleep apnea    On a CPAP    Medications:  (Not in a hospital admission)  Scheduled:   [START ON 09/16/2022] allopurinol  300 mg Oral Daily   [START ON 09/16/2022] vitamin C  1,000 mg Oral Daily   aspirin EC  81 mg Oral QODAY   [START ON 09/16/2022] cholecalciferol  1,000 Units Oral QODAY   [START ON 09/16/2022] famotidine  40 mg Oral Daily   [START ON 09/16/2022] furosemide  40 mg Oral QODAY   [START ON 09/16/2022] isosorbide mononitrate  15 mg Oral Daily   [START ON 09/16/2022] levothyroxine  25 mcg Oral QAC breakfast   potassium chloride  40 mEq Oral BID   primidone  50 mg Oral BID    Assessment: 82 YO male presenting with chest discomfort. He has a history of stable angina in the setting of CAD with AVR repair in 2016. Troponin 29 on admission and repeat labs pending now. Patient is currently in afib--not on anticoagulation PTA. Pharmacy consulted for heparin dosing.  Hgb 13.1, plts 141--stable. No signs/symptoms of bleeding.   Goal of Therapy:  Heparin level 0.3-0.7 units/ml Monitor platelets by anticoagulation protocol: Yes   Plan:  Give heparin 5000 units IV x1  Start heparin gtt @ 1300 units/hr  Check heparin level in 8hrs  Monitor heparin level, CBC, and s/sx of bleeding daily    Cherylin Mylar, PharmD PGY1 Pharmacy Resident 4/11/20247:44 PM

## 2022-09-15 NOTE — Progress Notes (Signed)
MEDICATION RELATED CONSULT NOTE - FOLLOW UP   Pharmacy Consult for anticoagulation choices for pt on primidone  Primidone is a substrate and inducer for multiple cytochrome P (CYP) enzymes, including a strong inducer for  CYP3A4. Because of this interaction, it was not safe to  prescribe any of the direct acting oral anticoagulants. Although  warfarin also had interactions with primidone, it is safer to use with careful INR monitoring.   Christoper Fabian, PharmD, BCPS Please see amion for complete clinical pharmacist phone list 09/15/2022,10:30 PM

## 2022-09-15 NOTE — ED Triage Notes (Signed)
Pt arrived via ems for chest pain with afib with rvr Pt has long hx but not has had afib in many years Per wife last time pt ended up having CABG Pt had NTG x 3 and 324 mg ASA

## 2022-09-16 ENCOUNTER — Ambulatory Visit: Payer: Medicare Other | Admitting: Podiatry

## 2022-09-16 ENCOUNTER — Encounter: Payer: Self-pay | Admitting: Neurology

## 2022-09-16 ENCOUNTER — Telehealth (HOSPITAL_COMMUNITY): Payer: Self-pay | Admitting: Pharmacy Technician

## 2022-09-16 ENCOUNTER — Other Ambulatory Visit (HOSPITAL_COMMUNITY): Payer: Self-pay

## 2022-09-16 ENCOUNTER — Inpatient Hospital Stay (HOSPITAL_COMMUNITY): Payer: Medicare Other

## 2022-09-16 DIAGNOSIS — I4892 Unspecified atrial flutter: Secondary | ICD-10-CM | POA: Diagnosis not present

## 2022-09-16 DIAGNOSIS — I480 Paroxysmal atrial fibrillation: Secondary | ICD-10-CM

## 2022-09-16 DIAGNOSIS — I4819 Other persistent atrial fibrillation: Secondary | ICD-10-CM

## 2022-09-16 LAB — COMPREHENSIVE METABOLIC PANEL
ALT: 27 U/L (ref 0–44)
AST: 19 U/L (ref 15–41)
Albumin: 3.6 g/dL (ref 3.5–5.0)
Alkaline Phosphatase: 56 U/L (ref 38–126)
Anion gap: 10 (ref 5–15)
BUN: 14 mg/dL (ref 8–23)
CO2: 21 mmol/L — ABNORMAL LOW (ref 22–32)
Calcium: 8.7 mg/dL — ABNORMAL LOW (ref 8.9–10.3)
Chloride: 104 mmol/L (ref 98–111)
Creatinine, Ser: 0.88 mg/dL (ref 0.61–1.24)
GFR, Estimated: >60 mL/min (ref >60)
Glucose, Bld: 157 mg/dL — ABNORMAL HIGH (ref 70–99)
Potassium: 3.6 mmol/L (ref 3.5–5.1)
Sodium: 135 mmol/L (ref 135–145)
Total Bilirubin: 1.6 mg/dL — ABNORMAL HIGH (ref 0.3–1.2)
Total Protein: 6.2 g/dL — ABNORMAL LOW (ref 6.5–8.1)

## 2022-09-16 LAB — ECHOCARDIOGRAM COMPLETE
AV Mean grad: 12 mmHg
AV Peak grad: 22.9 mmHg
Ao pk vel: 2.39 m/s
Area-P 1/2: 2.09 cm2
Height: 68 in
S' Lateral: 2.5 cm
Single Plane A2C EF: 53.9 %
Weight: 3386.27 oz

## 2022-09-16 LAB — CBC WITH DIFFERENTIAL/PLATELET
Abs Immature Granulocytes: 0.02 10*3/uL (ref 0.00–0.07)
Basophils Absolute: 0.1 10*3/uL (ref 0.0–0.1)
Basophils Relative: 1 %
Eosinophils Absolute: 0.1 10*3/uL (ref 0.0–0.5)
Eosinophils Relative: 2 %
HCT: 46.6 % (ref 39.0–52.0)
Hemoglobin: 15.9 g/dL (ref 13.0–17.0)
Immature Granulocytes: 0 %
Lymphocytes Relative: 22 %
Lymphs Abs: 1.8 10*3/uL (ref 0.7–4.0)
MCH: 31.6 pg (ref 26.0–34.0)
MCHC: 34.1 g/dL (ref 30.0–36.0)
MCV: 92.6 fL (ref 80.0–100.0)
Monocytes Absolute: 1.1 10*3/uL — ABNORMAL HIGH (ref 0.1–1.0)
Monocytes Relative: 13 %
Neutro Abs: 5.1 10*3/uL (ref 1.7–7.7)
Neutrophils Relative %: 62 %
Platelets: 154 10*3/uL (ref 150–400)
RBC: 5.03 MIL/uL (ref 4.22–5.81)
RDW: 13.8 % (ref 11.5–15.5)
WBC: 8.2 10*3/uL (ref 4.0–10.5)
nRBC: 0 % (ref 0.0–0.2)

## 2022-09-16 LAB — HEPARIN LEVEL (UNFRACTIONATED)
Heparin Unfractionated: 0.21 IU/mL — ABNORMAL LOW (ref 0.30–0.70)
Heparin Unfractionated: 0.2 IU/mL — ABNORMAL LOW (ref 0.30–0.70)

## 2022-09-16 MED ORDER — ENOXAPARIN SODIUM 100 MG/ML IJ SOSY
100.0000 mg | PREFILLED_SYRINGE | Freq: Two times a day (BID) | INTRAMUSCULAR | Status: DC
  Administered 2022-09-16 – 2022-09-18 (×4): 100 mg via SUBCUTANEOUS
  Filled 2022-09-16 (×4): qty 1

## 2022-09-16 MED ORDER — DILTIAZEM HCL ER COATED BEADS 120 MG PO CP24
240.0000 mg | ORAL_CAPSULE | Freq: Every day | ORAL | Status: DC
  Administered 2022-09-16 – 2022-09-18 (×3): 240 mg via ORAL
  Filled 2022-09-16 (×3): qty 2

## 2022-09-16 MED ORDER — WARFARIN - PHARMACIST DOSING INPATIENT: Freq: Every day | Status: DC

## 2022-09-16 MED ORDER — HEPARIN BOLUS VIA INFUSION
1500.0000 [IU] | Freq: Once | INTRAVENOUS | Status: AC
  Administered 2022-09-16: 1500 [IU] via INTRAVENOUS
  Filled 2022-09-16: qty 1500

## 2022-09-16 MED ORDER — WARFARIN SODIUM 5 MG PO TABS: 7.5000 mg | ORAL_TABLET | Freq: Once | ORAL | Status: DC

## 2022-09-16 NOTE — Progress Notes (Addendum)
ANTICOAGULATION CONSULT NOTE -   Pharmacy Consult for heparin Indication: atrial fibrillation  Allergies  Allergen Reactions   Acyclovir And Related Other (See Comments)    Accumulates and causes stroke-like side-effects due to cytochrome P450 enzyme deficiency   Benzodiazepines Other (See Comments)    Patient states he gets stroke symptoms with benzo's.    Compazine [Prochlorperazine Maleate] Other (See Comments)    Accumulates and causes stroke-like side-effects due to cytochrome P450 enzyme deficiency   Coreg [Carvedilol] Other (See Comments)    Accumulates and causes stroke-like side-effects due to cytochrome P450 enzyme deficiency Patient poor metabolizer of CYP2D6 - Coreg undergoes extensive hepatic (including 2D6)   Flomax [Tamsulosin Hcl] Other (See Comments)    Accumulates and causes stroke-like side-effects due to cytochrome P450 enzyme deficiency   Lisinopril Other (See Comments)    Hyperkalemia. 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   Mobic [Meloxicam] Other (See Comments)    Accumulates and causes stroke-like side-effects due to cytochrome P450 enzyme deficiency   Morphine And Related Other (See Comments)    Per pt's wife morphine also contraindicated due to p450 enzyme deficiency.     Ondansetron Other (See Comments)    Accumulates and causes stroke-like side-effects due to cytochrome P450 enzyme deficiency    Oxycodone Other (See Comments)    Accumulates and causes stroke-like side-effects due to cytochrome P450 enzyme deficiency   Promethazine Other (See Comments)    Accumulates and causes stroke-like side-effects due to cytochrome P450 enzyme deficiency   Sertraline Hcl Other (See Comments)    Accumulates and causes stroke-like side-effects due to cytochrome P450 enzyme deficiency   Tramadol Other (See Comments)    Accumulates and causes stroke-like side-effects due to cytochrome P450 enzyme deficiency    Atorvastatin Other (See Comments)    MYALGIAS   Colchicine Other (See Comments)    ataxia   Fentanyl Other (See Comments)    Accumulates and cause stroke like symptoms    Patient Measurements: Height:  (172.7 cm) Weight: 96 kg (211 lb 10.3 oz) IBW/kg (Calculated) : 68.4 Heparin Dosing Weight: 88.7 kg  Vital Signs: Temp: 99.3 F (37.4 C) (04/12 0305) Temp Source: Axillary (04/12 0305) BP: 140/95 (04/12 0800) Pulse Rate: 61 (04/12 0800)  Labs: Recent Labs    09/15/22 1507 09/15/22 1829 09/15/22 2008 09/16/22 0158 09/16/22 0928  HGB 13.1  --   --  15.9  --   HCT 38.5*  --   --  46.6  --   PLT 141*  --   --  154  --   LABPROT 14.4  --   --   --   --   INR 1.1  --   --   --   --   HEPARINUNFRC  --   --   --  0.21* 0.20*  CREATININE 0.80  --   --  0.88  --   TROPONINIHS 29* 34* 39*  --   --      Estimated Creatinine Clearance: 76.4 mL/min (by C-G formula based on SCr of 0.88 mg/dL).   Medical History: Past Medical History:  Diagnosis Date   Allergy    Anxiety    PTSD   Aortic stenosis, moderate 03/27/2015   post AVR echo Echo 11/16: Mild LVH, EF 55-60%, normal wall motion, grade 1 diastolic dysfunction, bioprosthetic AVR okay (mean gradient 18 mmHg) with trivial AI, mild LAE, atrial septal aneurysm, small effusion    Arthritis    "qwhere" (  10/29/2015)   Cancer (HCC)    skin cancer   Carotid artery stenosis    Carotid US 11/17: 1-39% bilateral ICA stenosis. F/u prn   Cervical stenosis of spinal canal    fusion 2006   CHF (congestive heart failure) (HCC)    "secondry to [facility]"   Closed left pilon fracture    COLONIC POLYPS, HX OF    Complication of anesthesia    "takes a lot to put him under and has woken up during surgery" Difficult to wake up . PTSD; "does not metabolize RX well"that he gets "violent", per pt.    Coronary artery disease    DIVERTICULOSIS, COLON    Dysrhythmia    2 bouts of afib post op and at home but corrected with medication.    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   Past Month   allopurinol (ZYLOPRIM) 300 MG tablet TAKE 1 TABLET BY MOUTH EVERY DAY (Patient taking differently: Take 300 mg by mouth daily.) 90 tablet 3 09/15/2022   amLODipine (NORVASC) 10 MG tablet TAKE 1 TABLET BY MOUTH EVERY DAY (Patient taking differently: Take 10 mg by mouth daily.) 90 tablet 3 09/14/2022   Ascorbic Acid (VITAMIN C) 1000 MG tablet Take 1,000 mg by mouth daily.   09/15/2022   aspirin EC 81 MG tablet Take 81 mg by mouth every other day. Swallow whole.   09/15/2022   calcium carbonate (TUMS EX) 750 MG chewable tablet Chew 1 tablet by mouth daily as needed for heartburn.    Past Month   Carboxymethylcellulose Sodium (LUBRICANT EYE DROPS OP) Place 1 drop into both eyes 2 (two) times daily as needed (dry/irritated eyes.).    09/15/2022   Cholecalciferol (VITAMIN D-3) 25 MCG (1000 UT) CAPS Take 1,000 Units by mouth every other day.   09/14/2022   famotidine (PEPCID) 40 MG tablet Take 1 tablet (40 mg total) by mouth daily. 90 tablet 3 09/15/2022    furosemide (LASIX) 40 MG tablet TAKE 1 TABLET BY MOUTH EVERY OTHER DAY (Patient taking differently: Take 40 mg by mouth every other day.) 45 tablet 2 09/14/2022   isosorbide mononitrate (IMDUR) 30 MG 24 hr tablet Take 1 tablet (30 mg total) by mouth daily. 30 tablet 11 09/15/2022   levothyroxine (SYNTHROID) 25 MCG tablet TAKE 1 TABLET BY MOUTH EVERY DAY WITH BREAKFAST (Patient taking differently: Take 25 mcg  by mouth daily before breakfast.) 90 tablet 3 09/15/2022   nitroGLYCERIN (NITROSTAT) 0.4 MG SL tablet PLACE 1 TABLET UNDER THE TONGUE EVERY 5 MINUTES AS NEEDED FOR CHEST PAIN. (Patient taking differently: Place 0.4 mg under the tongue every 5 (five) minutes as needed for chest pain.) 25 tablet 3 09/15/2022   primidone (MYSOLINE) 50 MG tablet TAKE 1 TABLET BY MOUTH 2 TIMES DAILY 180 tablet 0 09/15/2022   rosuvastatin (CRESTOR) 10 MG tablet TAKE 1/2 TABLET BY MOUTH EVERY DAY (Patient taking differently: Take 5 mg by mouth every evening.) 45 tablet 3 09/14/2022   tiZANidine (ZANAFLEX) 4 MG tablet Take 2 mg by mouth every 8 (eight) hours as needed for muscle spasms.   Past Month   Turmeric 500 MG CAPS Take 500 mg by mouth daily.    09/14/2022   vitamin B-12 (CYANOCOBALAMIN) 1000 MCG tablet Take 1,000 mcg by mouth every other day.   09/14/2022   Scheduled:   allopurinol  300 mg Oral Daily   vitamin C  1,000 mg Oral Daily   aspirin EC  81 mg Oral QODAY   cholecalciferol  1,000 Units Oral QODAY   famotidine  40 mg Oral Daily   furosemide  40 mg Oral QODAY   isosorbide mononitrate  15 mg Oral Daily   levothyroxine  25 mcg Oral QAC breakfast   potassium chloride  40 mEq Oral BID   primidone  50 mg Oral BID   warfarin  7.5 mg Oral ONCE-1600   Warfarin - Pharmacist Dosing Inpatient   Does not apply q1600    Assessment: 82 YO male presenting with chest discomfort. He has a history of stable angina in the setting of CAD with AVR repair in 2016.  He is noted with afib and pharmacy dosing heparin and warfarin  (DOACs are not an option due to an interaction with primidone). Primidone may increase warfarin dosing requirements. -heparin level = 0.2 on 1500 units/hr -INR= 1.1 on 4/11   Goal of Therapy:  Heparin level 0.3-0.7 units/ml Monitor platelets by anticoagulation protocol: Yes   Plan:  -heparin bolus 1500 units x1 and increase to 1700 units/hr -Heparin level in 8 hours and daily wth CBC daily -Warfarin 7.5mg  x1 today  Harland German, PharmD Clinical Pharmacist **Pharmacist phone directory can now be found on [email or web address] ([email or web address]).  Listed under [facility].  Addendum -plans are to transition to lovenox then to apixaban at a later time  Plan -Discontinue heparin at 8pm and start lovenox 100mg  sq q12h -CBC every 3 days  Harland German, PharmD Clinical Pharmacist **Pharmacist phone directory can now be found on [email or web address] ([email or web address]).  Listed under [facility].

## 2022-09-16 NOTE — Progress Notes (Signed)
PROGRESS NOTE   Troy Banks  BMW:[id number] DOB: 1940-06-18 DOA: 09/15/2022 PCP: Corwin Levins, MD  Brief Narrative:  82 year old white male CAD CABG AVR 2016 CYP 450 CYP2D6 poor  metabolization meds-last cardiac cath 12/03/2021 stable HFpEF HTN hypothyroid DM TY 2 Gout right knee Chronic migraines with no response to Botox Chronic low back pain follows with Dr. Magnus Ivan ESI injections Dr. Alvester Morin Essential tremor status post focused ultrasound to right VIM follows with Dr. Gladstone Pih there on primidone OSA on CPAP Paroxysmal A-fib CHADVASC >3 not on any anticoagulation nor on rate control presumably because of CYP 450 issues   Developed sudden onset chest heaviness with radiation to the back discomfort nausea early in the morning on 4/11-had difficulty getting up no chest pain no fever Has had angina last week and took!  6 NITROGLYCERIN!!  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  Watchman device?  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 Loletha Grayer, also discussed with pharmacy staff Not a good candidate for beta-blockade-May be able to use Cardizem-cardiology to decide final rate versus rhythm control strategy as above  Essential tremor status post focused ultrasound to right  VIM--follows at [facility] with Dr. Gladstone Pih Discontinue primidone after discussion with local neurologist Dr. Lurena Joiner [name]--will follow-up in the outpatient setting with regards to other strategies for further management  DM TY 2 CBGs 1 controlled eating 100% of meals, continue to monitor today but if sugars remain below 180 would discontinue sliding scale checks  CAD status post CABG with AVR repair 2016 Currently on lower dose of Imdur 15 amlodipine 10 aspirin 81 Crestor 5 nightly to be resumed on discharge  Chronic issues include gout, migraines without response to Botox as well as hypothyroidism  DVT prophylaxis: Lovenox/heparin as per cardiology Code Status: DNR confirmed with wife previously Family Communication: Long discussion with wife-offered to call patient's daughter who is a primary care physician in [place] Dr Troy Banks  Disposition:  Status is: Inpatient Remains inpatient appropriate because:    Requires durable rate control and discussion about meds on discharge with cardiology  Subjective: Seen earlier in the day had gone for echo by the time he returned looks comfortable Converted to sinus rhythm later on in the day he seems fair without overt chest pain although it is intermittent No cough no wheeze no fever  Objective: Vitals:   09/16/22 0600 09/16/22 0800 09/16/22 1330 09/16/22 1600  BP: 115/64 (!) 140/95 (!) 129/90 (!) 143/82  Pulse: 68 61  (!) 58  Resp: 20 20  19   Temp:      TempSrc:      SpO2: 92% 94%  97%  Weight:      Height:        Intake/Output Summary (Last 24 hours) at  09/16/2022 1626 Last data filed at 09/16/2022 1600 Gross per 24 hour  Intake 1606.45 ml  Output 1950 ml  Net -343.55 ml   Filed Weights   09/15/22 1521  Weight: 96 kg    Examination:  Thick neck Mallampati 4 arcus senilis Neck soft supple No JVD at 30 degrees Chest is clear without wheeze rales rhonchi midline chest scar Abdomen obese nontender no rebound Trace lower  extremity edema cannot appreciate any organomegaly in abdomen Neurologically intact moving 4 limbs equally without focal deficit Psych euthymic congruent  Data Reviewed: personally reviewed   CBC    Component Value Date/Time   WBC 8.2 09/16/2022 0158   RBC 5.03 09/16/2022 0158   HGB 15.9 09/16/2022 0158   HGB 15.9 10/19/2018 1411   HCT 46.6 09/16/2022 0158   HCT 46.9 10/19/2018 1411   PLT 154 09/16/2022 0158   PLT 146 (L) 10/19/2018 1411   MCV 92.6 09/16/2022 0158   MCV 89 10/19/2018 1411   MCH 31.6 09/16/2022 0158   MCHC 34.1 09/16/2022 0158   RDW 13.8 09/16/2022 0158   RDW 13.1 10/19/2018 1411   LYMPHSABS 1.8 09/16/2022 0158   MONOABS 1.1 (H) 09/16/2022 0158   EOSABS 0.1 09/16/2022 0158   BASOSABS 0.1 09/16/2022 0158      Latest Ref Rng & Units 09/16/2022    1:58 AM 09/15/2022    3:07 PM 06/28/2022   12:12 PM  CMP  Glucose 70 - 99 mg/dL 093  818  299   BUN 8 - 23 mg/dL 14  11  19    Creatinine 0.61 - 1.24 mg/dL 3.71  6.96  7.89   Sodium 135 - 145 mmol/L 135  142  140   Potassium 3.5 - 5.1 mmol/L 3.6  2.8  5.0   Chloride 98 - 111 mmol/L 104  108  104   CO2 22 - 32 mmol/L 21  21  28    Calcium 8.9 - 10.3 mg/dL 8.7  7.8  9.7   Total Protein 6.5 - 8.1 g/dL 6.2  5.0  6.4   Total Bilirubin 0.3 - 1.2 mg/dL 1.6  1.2  1.3   Alkaline Phos 38 - 126 U/L 56  46  57   AST 15 - 41 U/L 19  20  18    ALT 0 - 44 U/L 27  22  24       Radiology Studies: ECHOCARDIOGRAM COMPLETE  Result Date: 09/16/2022    ECHOCARDIOGRAM REPORT   Patient Name:   Troy Banks Date of Exam: 09/16/2022 Medical Rec #:  [id number]          Height:       68.0 in Accession #:    [id number]         Weight:       211.6 lb Date of Birth:  [birth year]          BSA:          2.093 m Patient Age:    81 years           BP:           140/95 mmHg Patient Gender: M                  HR:           104 bpm. Exam Location:  Inpatient Procedure: 2D Echo, Color Doppler and Cardiac Doppler Indications:    Atrial Flutter   History:        Patient has  prior history of Echocardiogram examinations, most                 recent 12/04/2021. CHF, CAD, Prior CABG, Aortic Valve Disease,                 Arrythmias:Atrial Flutter, Signs/Symptoms:Chest Pain; Risk                 Factors:Hypertension, Dyslipidemia and Diabetes. OSA, 23mm                 Edwards Porcine AVR.  Sonographer:    Milbert Coulter Referring Phys: Rhetta Mura IMPRESSIONS  1. Left ventricular ejection fraction, by estimation, is 60 to 65%. The left ventricle has normal function. The left ventricle has no regional wall motion abnormalities. There is moderate left ventricular hypertrophy. Left ventricular diastolic parameters are consistent with Grade I diastolic dysfunction (impaired relaxation).  2. Right ventricular systolic function is normal. The right ventricular size is normal. There is normal pulmonary artery systolic pressure.  3. No evidence of mitral valve regurgitation.  4. S/p 23 mm Edwards Porcine AVR. V max 2.3 m/s. Mean gradient 12 mmHg. DI 0.45. No PVL. Normal prosthesis. Aortic valve regurgitation is not visualized.  5. Aortic aorta is not well visualized.  6. The inferior vena cava is dilated in size with >50% respiratory variability, suggesting right atrial pressure of 8 mmHg. FINDINGS  Left Ventricle: Left ventricular ejection fraction, by estimation, is 60 to 65%. The left ventricle has normal function. The left ventricle has no regional wall motion abnormalities. The left ventricular internal cavity size was normal in size. There is  moderate left ventricular hypertrophy. Left ventricular diastolic parameters are consistent with Grade I diastolic dysfunction (impaired relaxation). Right Ventricle: The right ventricular size is normal. Right ventricular systolic function is normal. There is normal pulmonary artery systolic pressure. The tricuspid regurgitant velocity is 1.96 m/s, and with an assumed right atrial pressure of 8 mmHg,  the estimated  right ventricular systolic pressure is 23.4 mmHg. Left Atrium: Left atrial size was normal in size. Right Atrium: Right atrial size was normal in size. Pericardium: There is no evidence of pericardial effusion. Mitral Valve: Mild mitral annular calcification. No evidence of mitral valve regurgitation. Tricuspid Valve: Tricuspid valve regurgitation is trivial. Aortic Valve: S/p 23 mm Edwards Porcine AVR. V max 2.3 m/s. Mean gradient 12 mmHg. DI 0.45. No PVL. Normal prosthesis. Aortic valve regurgitation is not visualized. Aortic valve mean gradient measures 12.0 mmHg. Aortic valve peak gradient measures 22.9 mmHg. Pulmonic Valve: Pulmonic valve regurgitation is not visualized. Aorta: Aorta is not well visualized. Venous: The inferior vena cava is dilated in size with greater than 50% respiratory variability, suggesting right atrial pressure of 8 mmHg. IAS/Shunts: No atrial level shunt detected by color flow Doppler.  LEFT VENTRICLE PLAX 2D LVIDd:         3.50 cm     Diastology LVIDs:         2.50 cm     LV e' medial:    4.79 cm/s LV PW:         1.60 cm     LV E/e' medial:  23.4 LV IVS:        1.30 cm     LV e' lateral:   6.74 cm/s                            LV E/e' lateral: 16.6  LV Volumes (MOD)  LV vol d, MOD A2C: 54.2 ml LV vol s, MOD A2C: 25.0 ml LV SV MOD A2C:     29.2 ml LEFT ATRIUM           Index        RIGHT ATRIUM           Index LA diam:      4.30 cm 2.05 cm/m   RA Area:     14.90 cm LA Vol (A2C): 54.5 ml 26.03 ml/m  RA Volume:   33.40 ml  15.95 ml/m LA Vol (A4C): 23.9 ml 11.42 ml/m  AORTIC VALVE AV Vmax:           239.33 cm/s AV Vmean:          157.333 cm/s AV VTI:            0.524 m AV Peak Grad:      22.9 mmHg AV Mean Grad:      12.0 mmHg LVOT Vmax:         109.00 cm/s LVOT Vmean:        74.700 cm/s LVOT VTI:          0.238 m LVOT/AV VTI ratio: 0.45 MITRAL VALVE                TRICUSPID VALVE MV Area (PHT): 2.09 cm     TR Peak grad:   15.4 mmHg MV Decel Time: 363 msec     TR Vmax:        196.00  cm/s MV E velocity: 112.00 cm/s MV A velocity: 110.00 cm/s  SHUNTS MV E/A ratio:  1.02         Systemic VTI: 0.24 m Carolan Clines Electronically signed by Carolan Clines Signature Date/Time: 09/16/2022/3:59:41 PM    Final    DG Chest Port 1 View  Result Date: 09/15/2022 CLINICAL DATA:  Provided history: Chest pain. AFib with RVR. EXAM: PORTABLE CHEST 1 VIEW COMPARISON:  Prior chest radiographs 04/20/2022 and earlier. FINDINGS: Prior median sternotomy, CABG and aortic valve replacement. Heart size within normal limits. Aortic atherosclerosis. Central pulmonary vascular congestion without overt pulmonary edema. No appreciable airspace consolidation. No evidence of pleural effusion or pneumothorax. No acute osseous abnormality identified. Chronic, healed right rib fractures. ACDF hardware. IMPRESSION: 1. Central pulmonary vascular congestion without overt pulmonary edema. 2.  Aortic Atherosclerosis (ICD10-I70.0). Electronically Signed   By: Jackey Loge D.O.   On: 09/15/2022 15:32     Scheduled Meds:  allopurinol  300 mg Oral Daily   vitamin C  1,000 mg Oral Daily   aspirin EC  81 mg Oral QODAY   cholecalciferol  1,000 Units Oral QODAY   diltiazem  240 mg Oral Daily   enoxaparin (LOVENOX) injection  100 mg Subcutaneous Q12H   famotidine  40 mg Oral Daily   furosemide  40 mg Oral QODAY   isosorbide mononitrate  15 mg Oral Daily   levothyroxine  25 mcg Oral QAC breakfast   potassium chloride  40 mEq Oral BID   primidone  50 mg Oral BID   Continuous Infusions:  heparin 1,700 Units/hr (09/16/22 1600)     LOS: 1 day   Time spent: 43  Rhetta Mura, MD [facility] To contact the attending provider between 7A-7P or the covering provider during after hours 7P-7A, please log into the web site [email or web address] and access using universal [place] password for that web site. If you do not have the password, please call the hospital operator.  09/16/2022, 4:26 PM

## 2022-09-16 NOTE — Progress Notes (Signed)
ANTICOAGULATION CONSULT NOTE - Follow Up Consult  Pharmacy Consult for heparin Indication: atrial fibrillation  Labs: Recent Labs    09/15/22 1507 09/15/22 1829 09/15/22 2008 09/16/22 0158  HGB 13.1  --   --  15.9  HCT 38.5*  --   --  46.6  PLT 141*  --   --  154  LABPROT 14.4  --   --   --   INR 1.1  --   --   --   HEPARINUNFRC  --   --   --  0.21*  CREATININE 0.80  --   --  0.88  TROPONINIHS 29* 34* 39*  --     Assessment: 81yo male subtherapeutic on heparin with initial dosing for Afib; no infusion issues or signs of bleeding per RN.  Goal of Therapy:  Heparin level 0.3-0.7 units/ml   Plan:  Will increase heparin infusion by 2 units/kg/hr to 1500 units/hr and check level in 6 hours.    Vernard Gambles, PharmD, BCPS  09/16/2022,3:15 AM

## 2022-09-16 NOTE — Progress Notes (Signed)
  Transition of Care [facility]) Screening Note   Patient Details  Name: Troy Banks Date of Birth: 03/17/1941   [facility]) CM/SW Contact:    Kermit Balo, RN Phone Number: 09/16/2022, 3:09 PM   Pt is from home with his spouse. On heparin gtt and converting to coumadin.  Plan for TEE/DCC on Monday if INR > 2.0   Transition of Care Department [facility]) has reviewed patient. We will continue to monitor patient advancement through interdisciplinary progression rounds. If new patient transition needs arise, please place a TOC consult.

## 2022-09-16 NOTE — Consult Note (Addendum)
ELECTROPHYSIOLOGY CONSULT NOTE    Patient ID: Troy Banks MRN: [id number], DOB/AGE: [birth year] 82 y.o.  Admit date: 09/15/2022 Date of Consult: 09/16/2022  Primary Physician: Corwin Levins, MD Primary Cardiologist: Peter Swaziland, MD  Electrophysiologist: New   Referring Provider: Dr. Eden Emms  Patient Profile: Troy Banks is a 82 y.o. 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 Eden Emms.   HPI:  Troy Banks is a 82 y.o. male with medical history as above.   Presented 11/2021 with chest pain unrelieved by nitroglycerin.  Underwent repeat cardiac catheterization which revealed patent bypass grafts with medical therapy recommended and isosorbide was added to his medication regimen.  LVEF of 55 to 60%, mild LVH, mildly reduced RV function, mild RV enlargement, moderate dilation of the left atrium, mild MR, stable bioprosthetic aortic valve with mild aortic stenosis.   Last seen in the office on 05/2022 and reported doing well at this visit.  Troy was continued on medications without significant changes.    Presented to the ED on 4/11 with complaints of centralized chest pain and shortness of breath.  States earlier this morning Troy went for a veterans physical exam and felt well.  Later after getting home Troy was sitting resting and developed centralized chest pain around 10 AM.  Ultimately presented to the ED for further evaluation.  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   Pt is feeling better today on rate control. Currently HRs 80-90s. Wife says they have been worried his genetic abnormalities have been  2/2 Agent Orange or other chemical agent exposure. Question was initially raised after a severe reaction to valium and an MD recommended genetic testing.   Troy is willing to be tried off primadone. 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    Labs Potassium3.6 (04/12 0158) Magnesium  1.7 (04/11 1829) Creatinine, ser  0.88 (04/12 0158) PLT  154 (04/12 0158) HGB  15.9 (04/12 0158) WBC 8.2 (04/12 0158) Troponin I (High Sensitivity)39* (04/11 2008).    Past Medical History:  Diagnosis Date   Allergy    Anxiety    PTSD   Aortic stenosis, moderate 03/27/2015   post AVR echo Echo 11/16: Mild LVH, EF 55-60%, normal wall motion, grade 1 diastolic dysfunction, bioprosthetic AVR okay (mean gradient 18 mmHg) with trivial AI, mild LAE, atrial septal aneurysm, small effusion    Arthritis    "qwhere" (10/29/2015)   Cancer (HCC)    skin cancer   Carotid artery stenosis    Carotid US 11/17: 1-39% bilateral ICA stenosis. F/u prn   Cervical stenosis of spinal canal    fusion 2006   CHF (congestive heart failure) (HCC)    "secondry to [facility]"   Closed left pilon fracture    COLONIC POLYPS, HX OF    Complication of anesthesia    "takes a lot to put him under and has woken up during surgery" Difficult to wake up . PTSD; "does not metabolize RX well"that Troy gets "violent", per pt.    Coronary artery disease    DIVERTICULOSIS, COLON    Dysrhythmia    2 bouts of afib post op and at home but corrected with medication.  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)    confirmed heterozygous 10/24/14 labs   PTSD (post-traumatic stress disorder)    PTSD (post-traumatic stress disorder)    Sleep apnea    On a CPAP     Surgical History:  Past Surgical History:  Procedure Laterality Date   ANKLE FUSION Left 03/17/2017   Procedure: FUSION  PILON FRACTURE WITH BONE GRAFTING;  Surgeon: Roby Lofts, MD;  Location: [facility] OR;  Service: Orthopedics;  Laterality: Left;   ANTERIOR FUSION CERVICAL SPINE  2002   AORTIC VALVE REPLACEMENT N/A 03/30/2015   Procedure: AORTIC VALVE REPLACEMENT (AVR);  Surgeon: Delight Ovens, MD;  Location: [facility] OR;  Service: Open Heart Surgery;  Laterality: N/A;   CARDIAC CATHETERIZATION N/A 03/26/2015   Procedure: Left Heart Cath and Coronary Angiography;  Surgeon: Marykay Lex, MD;  Location: [facility] INVASIVE CV LAB;  Service: Cardiovascular;  Laterality: N/A;   CARDIAC VALVE REPLACEMENT     COLONOSCOPY W/ POLYPECTOMY     CORONARY ARTERY BYPASS GRAFT N/A 03/30/2015   Procedure: CORONARY ARTERY BYPASS GRAFTING (CABG) x four, using left internal mammary artery and left    leg greater saphenous vein harvested endoscopically ;  Surgeon: Delight Ovens, MD;  Location: [facility] OR;  Service: Open Heart Surgery;  Laterality: N/A;   CORONARY/GRAFT ANGIOGRAPHY N/A 12/03/2021   Procedure: CORONARY/GRAFT ANGIOGRAPHY;  Surgeon: Swaziland, [name], MD;  Location: [facility] INVASIVE CV LAB;  Service: Cardiovascular;  Laterality: N/A;   CYSTOSCOPY/URETEROSCOPY/HOLMIUM LASER/STENT PLACEMENT Bilateral 11/07/2016   Procedure: CYSTOSCOPY/URETEROSCOPY/HOLMIUM LASER/STENT PLACEMENT;  Surgeon: Hildred Laser, MD;  Location: [facility] ORS;  Service: Urology;  Laterality: Bilateral;   CYSTOSCOPY/URETEROSCOPY/HOLMIUM LASER/STENT PLACEMENT Bilateral 11/23/2016   Procedure: CYSTOSCOPY/BILATERAL URETEROSCOPY/HOLMIUM LASER/STENT EXCHANGE;  Surgeon: Hildred Laser, MD;  Location:  [facility] ORS;  Service: Urology;  Laterality: Bilateral;  NEEDS 90 MIN    EXTERNAL FIXATION LEG Left 02/22/2017   Procedure: EXTERNAL FIXATION LEFT ANKLE;  Surgeon: Sheral Apley, MD;  Location: [facility] OR;  Service: Orthopedics;  Laterality: Left;   HEMORRHOID BANDING  1970s   INGUINAL HERNIA REPAIR Left 1962   KIDNEY STONE SURGERY  1986   "cut me open"   LEFT HEART CATH AND CORS/GRAFTS ANGIOGRAPHY N/A 10/25/2018   Procedure: LEFT HEART CATH AND CORS/GRAFTS ANGIOGRAPHY;  Surgeon: Marykay Lex, MD;  Location: [facility] INVASIVE CV LAB;  Service: Cardiovascular;  Laterality: N/A;   LEFT HEART CATH AND CORS/GRAFTS ANGIOGRAPHY N/A 12/03/2021   Procedure: LEFT HEART CATH AND CORS/GRAFTS ANGIOGRAPHY;  Surgeon: Swaziland, [name], MD;  Location: [facility] INVASIVE CV LAB;  Service: Cardiovascular;  Laterality: N/A;   POSTERIOR FUSION CERVICAL SPINE  2006   TEE WITHOUT CARDIOVERSION N/A 03/30/2015   Procedure: TRANSESOPHAGEAL ECHOCARDIOGRAM (TEE);  Surgeon: Delight Ovens, MD;  Location: [facility] OR;  Service: Open Heart Surgery;  Laterality: N/A;   TESTICLE TORSION REDUCTION Right 1960   TOE FUSION Left 1985 & 2004   "great toe" and removal   TONSILLECTOMY     TOTAL HIP ARTHROPLASTY Right 10/27/2015   Procedure: RIGHT TOTAL HIP ARTHROPLASTY ANTERIOR APPROACH and steroid injection right foot;  Surgeon: Kathryne Hitch, MD;  Location: [facility] OR;  Service: Orthopedics;  Laterality: Right;   TOTAL HIP ARTHROPLASTY Left 08/13/2019   TOTAL HIP ARTHROPLASTY Left 08/13/2019   Procedure: LEFT TOTAL HIP ARTHROPLASTY ANTERIOR APPROACH;  Surgeon: Kathryne Hitch, MD;  Location: [facility] OR;  Service: Orthopedics;  Laterality: Left;     Medications Prior to Admission  Medication Sig Dispense Refill Last Dose   acetaminophen (TYLENOL) 500 MG tablet Take 1,000 mg by mouth every 6 (six) hours as needed for moderate pain.   Past Month   allopurinol (ZYLOPRIM) 300 MG tablet TAKE 1 TABLET BY MOUTH EVERY DAY (Patient taking differently:  Take 300 mg by mouth daily.) 90 tablet 3 09/15/2022   amLODipine (NORVASC) 10 MG tablet TAKE 1 TABLET BY MOUTH EVERY DAY (Patient taking differently: Take 10 mg by mouth daily.) 90 tablet 3 09/14/2022   Ascorbic Acid (VITAMIN C) 1000 MG tablet Take 1,000 mg by mouth daily.   09/15/2022   aspirin EC 81 MG tablet Take 81 mg by mouth every other day. Swallow whole.   09/15/2022   calcium carbonate (TUMS EX) 750 MG chewable tablet Chew 1 tablet by mouth daily as needed for heartburn.    Past Month   Carboxymethylcellulose Sodium (LUBRICANT EYE DROPS OP) Place 1 drop into both eyes 2 (two) times daily as needed (dry/irritated eyes.).    09/15/2022   Cholecalciferol (VITAMIN D-3) 25 MCG (1000 UT) CAPS Take 1,000 Units by mouth every other day.   09/14/2022   famotidine (PEPCID) 40 MG tablet Take 1 tablet (40 mg total) by mouth daily. 90 tablet 3 09/15/2022   furosemide (LASIX) 40 MG tablet TAKE 1 TABLET BY MOUTH EVERY OTHER DAY (Patient taking differently: Take 40 mg by mouth every other day.) 45 tablet 2 09/14/2022   isosorbide mononitrate (IMDUR) 30 MG 24 hr tablet Take 1 tablet (30 mg total) by mouth daily. 30 tablet 11 09/15/2022   levothyroxine (SYNTHROID) 25 MCG tablet TAKE 1 TABLET BY MOUTH EVERY DAY WITH BREAKFAST (Patient taking differently: Take 25 mcg by mouth daily before breakfast.) 90 tablet 3 09/15/2022   nitroGLYCERIN (NITROSTAT) 0.4 MG SL tablet PLACE 1 TABLET UNDER THE TONGUE EVERY 5 MINUTES AS NEEDED FOR CHEST PAIN. (Patient taking differently: Place 0.4 mg under the tongue every 5 (five) minutes as needed for chest pain.) 25 tablet 3 09/15/2022   primidone (MYSOLINE) 50 MG tablet TAKE 1 TABLET BY MOUTH 2 TIMES DAILY 180 tablet 0 09/15/2022   rosuvastatin (CRESTOR) 10 MG tablet TAKE 1/2 TABLET BY MOUTH EVERY DAY (Patient taking differently: Take 5 mg by mouth every evening.) 45 tablet 3 09/14/2022   tiZANidine (ZANAFLEX) 4 MG tablet Take 2 mg by mouth every 8 (eight) hours as needed for muscle spasms.    Past Month   Turmeric 500 MG CAPS Take 500 mg by mouth daily.    09/14/2022   vitamin B-12 (CYANOCOBALAMIN) 1000 MCG tablet Take 1,000 mcg by mouth every other day.   09/14/2022    Inpatient Medications:   allopurinol  300 mg Oral Daily   vitamin C  1,000 mg Oral Daily   aspirin EC  81 mg Oral QODAY   cholecalciferol  1,000 Units Oral QODAY   famotidine  40 mg Oral Daily   furosemide  40 mg Oral QODAY   isosorbide mononitrate  15 mg Oral Daily   levothyroxine  25 mcg Oral QAC breakfast   potassium chloride  40 mEq Oral BID   primidone  50 mg Oral BID    Allergies:  Allergies  Allergen Reactions   Acyclovir And Related Other (See Comments)    Accumulates and causes stroke-like side-effects due to cytochrome P450 enzyme deficiency   Benzodiazepines Other (See Comments)    Patient states Troy gets stroke symptoms with benzo's.    Compazine [Prochlorperazine Maleate] Other (See Comments)    Accumulates and causes stroke-like side-effects due to cytochrome P450 enzyme deficiency   Coreg [Carvedilol] Other (See Comments)    Accumulates and causes stroke-like side-effects due to cytochrome P450 enzyme deficiency Patient poor metabolizer of CYP2D6 - Coreg undergoes extensive hepatic (including 2D6)   Flomax [Tamsulosin Hcl] Other (See Comments)    Accumulates and causes stroke-like side-effects due to cytochrome P450 enzyme deficiency   Lisinopril Other (See Comments)    Hyperkalemia. 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   Mobic [Meloxicam] Other (See Comments)    Accumulates and causes stroke-like side-effects due to cytochrome P450 enzyme deficiency   Morphine And Related Other (See Comments)    Per pt's wife morphine also contraindicated due to p450 enzyme deficiency.     Ondansetron Other (See Comments)    Accumulates and causes stroke-like side-effects due to cytochrome P450 enzyme deficiency    Oxycodone Other (See Comments)     Accumulates and causes stroke-like side-effects due to cytochrome P450 enzyme deficiency   Promethazine Other (See Comments)    Accumulates and causes stroke-like side-effects due to cytochrome P450 enzyme deficiency   Sertraline Hcl Other (See Comments)    Accumulates and causes stroke-like side-effects due to cytochrome P450 enzyme deficiency   Tramadol Other (See Comments)    Accumulates and causes stroke-like side-effects due to cytochrome P450 enzyme deficiency   Atorvastatin Other (See Comments)    MYALGIAS   Colchicine Other (See Comments)    ataxia   Fentanyl Other (See Comments)    Accumulates and cause stroke like symptoms    Family History  Problem Relation Age of Onset   Heart disease Mother    Hypertension Mother    Diabetes Mother    Heart disease Father    Hypertension Father    Tremor Sister    Healthy Brother    Healthy Son    Prostate cancer Maternal Uncle    Hypertension Maternal Grandmother    Heart disease Maternal Grandmother    Hypertension Maternal Grandfather    Heart disease Maternal Grandfather    Hypertension Paternal Grandfather    Heart disease Paternal Grandfather    Diabetes Paternal Grandfather    Alcohol abuse Other    Arthritis Other    Drug abuse Sister    Healthy Daughter    Colon polyps Neg Hx    Esophageal cancer Neg Hx    Rectal cancer Neg Hx    Stomach cancer Neg Hx    Colon cancer Neg Hx      Physical Exam: Vitals:   09/16/22 0400 09/16/22 0500 09/16/22 0600 09/16/22 0800  BP: (!) 118/94 (!) 122/90 115/64 (!) 140/95  Pulse: (!) 134 80 68 61  Resp: (!) 22 17 20 20   Temp:  TempSrc:      SpO2: 92% 93% 92% 94%  Weight:      Height:        GEN- NAD, A&O x 3, normal affect HEENT: Normocephalic, atraumatic Lungs- CTAB, Normal effort.  Heart- Regular rate and rhythm, No M/G/R.  GI- Soft, NT, ND.  Extremities- No clubbing, cyanosis, or edema   Radiology/Studies: DG Chest Port 1 View  Result Date:  09/15/2022 CLINICAL DATA:  Provided history: Chest pain. AFib with RVR. EXAM: PORTABLE CHEST 1 VIEW COMPARISON:  Prior chest radiographs 04/20/2022 and earlier. FINDINGS: Prior median sternotomy, CABG and aortic valve replacement. Heart size within normal limits. Aortic atherosclerosis. Central pulmonary vascular congestion without overt pulmonary edema. No appreciable airspace consolidation. No evidence of pleural effusion or pneumothorax. No acute osseous abnormality identified. Chronic, healed right rib fractures. ACDF hardware. IMPRESSION: 1. Central pulmonary vascular congestion without overt pulmonary edema. 2.  Aortic Atherosclerosis (ICD10-I70.0). Electronically Signed   By: Jackey Loge D.O.   On: 09/15/2022 15:32    EKG: on arrival showed AF in 130s (personally reviewed)  Baseline EKG 12/2021 NSR at 62 bpm with 1st degree AV block 220-230 ms  TELEMETRY: AF with controlled rates currently in 70-90s mostly. Overnight 110-120s Occasional missed beats, longest pause ~ 2 seconds, asymptomatic.  (personally reviewed)   Assessment/Plan:  Persistent atrial fibrillation Pt feels good with rate control this am. Troy Banks transition dilt gtt to 240 mg daily. Can titrate further as needed.  CHA2DS2/VASc is at least 6. On heparin with plans for Coumadin  As discussed with primary cardiology, pt is not a candidate for advanced EP procedures at this time with very limited medicine options, namely OAC.  Also agree with repeat genetic testing so that we can get clarity as to whether or not his metabolism is actually affected for certain cardiac meds. Primidone prevents DOAC use and note plans to potentially wean off, otherwise Coumadin may be the best option for him with questions about metabolism, so that we can follow INR.  Not candidate for 1c agents with CAD.  Confirmed with pharmacy use of Amiodarone + primadone is not recommended, though otherwise his genetic abnormality shouldn't prohibit its use. Wife  confirms Troy tolerated it after his CABG.  K on arrival 2.8, Mg 1.7 today. Tikosyn is not a good option.   We arranged outpatient follow up to discuss best options once Troy is appropriately anticoagulated and the concerns around his genetic abnormality are more clear. Troy Banks need to be on stable anticoagulation to be considered for any EP procedures.   EP to sign off. Please call with questions.   For questions or updates, please contact [facility] Please consult [email or web address] for contact info under Cardiology/STEMI.  Dustin Flock, [name]  09/16/2022 10:22 AM  I have seen and examined this patient with Troy Banks.  Agree with above, note added to reflect my findings.  Troy has a history of coronary artery disease post CABG, bioprosthetic AVR, cytochrome P450 CYP2D6 genetic variant, hypertension, hyperlipidemia.  Troy presented to the emergency room with complaints of chest pain and shortness of breath.  Troy was found to be in atrial fibrillation with rapid response.  Troy was started on rate control with diltiazem.  Troy feels improved today.  Troy does not have chest pain or shortness of breath.  Troy feels that Troy is better than Troy usually is.  GEN: Well nourished, well developed, in no acute distress  HEENT: normal  Neck: no JVD, carotid bruits,  or masses Cardiac: irregular; no murmurs, rubs, or gallops,no edema  Respiratory:  clear to auscultation bilaterally, normal work of breathing GI: soft, nontender, nondistended, + BS MS: no deformity or atrophy  Skin: warm and dry Neuro:  Strength and sensation are intact Psych: euthymic mood, full affect   Persistent atrial fibrillation: CHA2DS2-VASc of 6.  Theon Sobotka need oral anticoagulation.  Troy is currently on heparin.  With his cytochrome deficiency, there is a limitation in his ability to take DOACs.  Coumadin would be his only option.  Additionally, Troy is on primidone for an essential tremor and thus amiodarone would not be an option  for rhythm control.  Troy also has coronary artery disease and thus class I agents cannot be used.  Would hold off on dofetilide as well as Troy has had significant hypokalemia.  Leslea Vowles switch his diltiazem to oral 250 daily.  Cadan Maggart plan for rate control and once Troy is well rate controlled, Troy has follow-up already arranged in EP clinic.  Kaydin Karbowski M. Amayia Ciano MD 09/16/2022 1:20 PM

## 2022-09-16 NOTE — Progress Notes (Signed)
See full cosigned note from PA Afib rate better controlled this am on iv cardizem rates in 80's Discussed following issues with patient  CYP2D6:  abnormality Testing was done at [facility] 2016 No idea exactly what abnormal polymorphism he has or if they are homozygous or heterozygous So actually no idea if his polymorphism induces, inhibits or has no effect on all these drugs listed as allergies. Will sent note to Doran Heater and encouraged patient to be specifically retested as his PAF is likely to get to be more of an issue as he gets older Primidone:  He cannot take any DOAC due to this. He doesn't even know why he takes it Likely for tremors Would try to wean this in hospital and then have his primary d/c this over next month so he can take DOAC in future For time being will keep him on heparin and start coumadin Long term he would be best served with ablation / Watchman I asked EP to see but they declined in hospital consult and I will arrange outpatient f/u   Would tentatively plan for TEE/DCC on Monday if INR > 2.0   Exam benign  Telemetry afib rates 80's  No murmur  Lungs clear Abdomen benign No edema  Charlton Haws MD [facility]

## 2022-09-16 NOTE — Progress Notes (Signed)
Discussed with the patient's outpatient neurologist Dr. Foye Spurling is fine with discontinuation of primidone hence DOAC is a feasibility I have discussed this with pharmacist and we will come up with a solution in about 3 weeks for him at this time patient and family are willing to use Lovenox until outpatient follow-up and would prefer not to use Coumadin (side effects, dietary restrictions etc. etc.) Defer rate control to cardiology Full note to follow

## 2022-09-16 NOTE — Plan of Care (Signed)

## 2022-09-16 NOTE — Telephone Encounter (Signed)
Pharmacy Patient Advocate Encounter  Insurance verification completed.    The patient is insured through [employer] Medicare Part D   The patient is currently admitted and ran test claims for the following: enoxaparin .  Copays and coinsurance results were relayed to Inpatient clinical team.  

## 2022-09-16 NOTE — Progress Notes (Addendum)
Rounding Note    Patient Name: Troy Banks Date of Encounter: 09/16/2022  [facility] Cardiologist: Peter Swaziland, MD   Subjective   Patient sitting up in bed this morning, no acute distress. Reports that he feels "about the same" as yesterday and continues to have mild chest tightness. No sensation of palpitations or shortness of breath.  Inpatient Medications    Scheduled Meds:  allopurinol  300 mg Oral Daily   vitamin C  1,000 mg Oral Daily   aspirin EC  81 mg Oral QODAY   cholecalciferol  1,000 Units Oral QODAY   famotidine  40 mg Oral Daily   furosemide  40 mg Oral QODAY   isosorbide mononitrate  15 mg Oral Daily   levothyroxine  25 mcg Oral QAC breakfast   potassium chloride  40 mEq Oral BID   primidone  50 mg Oral BID   Continuous Infusions:  diltiazem (CARDIZEM) infusion 10 mg/hr (09/16/22 0613)   heparin 1,500 Units/hr (09/16/22 0320)   PRN Meds: acetaminophen, tiZANidine   Vital Signs    Vitals:   09/16/22 0305 09/16/22 0400 09/16/22 0500 09/16/22 0600  BP: (!) 132/99 (!) 118/94 (!) 122/90 115/64  Pulse: (!) 106 (!) 134 80 68  Resp: 20 (!) Temp: 99.3 F (37.4 C)     TempSrc: Axillary     SpO2: 95% 92% 93% 92%  Weight:      Height:        Intake/Output Summary (Last 24 hours) at 09/16/2022 0744 Last data filed at 09/16/2022 0731 Gross per 24 hour  Intake 579.85 ml  Output 1400 ml  Net -820.15 ml      09/15/2022    3:21 PM 09/01/2022    3:29 PM 06/28/2022   10:28 AM  Last 3 Weights  Weight (lbs) 211 lb 10.3 oz 210 lb 210 lb  Weight (kg) 96 kg 95.255 kg 95.255 kg      Telemetry    Afib with intermittent RVR. Rates this morning 80s-130s - Personally Reviewed  ECG    No new tracing - Personally Reviewed  Physical Exam   GEN: No acute distress.   Neck: No JVD Cardiac: irregularly irregular and rapid, no murmurs, rubs, or gallops.  Respiratory: Clear to auscultation bilaterally. GI: Soft, nontender,  non-distended  MS: No edema; No deformity. Neuro:  Nonfocal  Psych: Normal affect   Labs    High Sensitivity Troponin:   Recent Labs  Lab 09/15/22 1507 09/15/22 1829 09/15/22 2008  TROPONINIHS 29* 34* 39*     Chemistry Recent Labs  Lab 09/15/22 1507 09/15/22 1829 09/16/22 0158  NA 142  --  135  K 2.8*  --  3.6  CL 108  --  104  CO2 21*  --  21*  GLUCOSE 126*  --  157*  BUN 11  --  14  CREATININE 0.80  --  0.88  CALCIUM 7.8*  --  8.7*  MG  --  1.7  --   PROT 5.0*  --  6.2*  ALBUMIN 3.0*  --  3.6  AST 20  --  19  ALT 22  --  27  ALKPHOS 46  --  56  BILITOT 1.2  --  1.6*  GFRNONAA >60  --  >60  ANIONGAP 13  --  10    Lipids No results for input(s): "CHOL", "TRIG", "HDL", "LABVLDL", "LDLCALC", "CHOLHDL" in the last 168 hours.  Hematology Recent Labs  Lab 09/15/22  1507 09/16/22 0158  WBC 7.0 8.2  RBC 4.15* 5.03  HGB 13.1 15.9  HCT 38.5* 46.6  MCV 92.8 92.6  MCH 31.6 31.6  MCHC 34.0 34.1  RDW 13.8 13.8  PLT 141* 154   Thyroid No results for input(s): "TSH", "FREET4" in the last 168 hours.  BNPNo results for input(s): "BNP", "PROBNP" in the last 168 hours.  DDimer No results for input(s): "DDIMER" in the last 168 hours.   Radiology    DG Chest Port 1 View  Result Date: 09/15/2022 CLINICAL DATA:  Provided history: Chest pain. AFib with RVR. EXAM: PORTABLE CHEST 1 VIEW COMPARISON:  Prior chest radiographs 04/20/2022 and earlier. FINDINGS: Prior median sternotomy, CABG and aortic valve replacement. Heart size within normal limits. Aortic atherosclerosis. Central pulmonary vascular congestion without overt pulmonary edema. No appreciable airspace consolidation. No evidence of pleural effusion or pneumothorax. No acute osseous abnormality identified. Chronic, healed right rib fractures. ACDF hardware. IMPRESSION: 1. Central pulmonary vascular congestion without overt pulmonary edema. 2.  Aortic Atherosclerosis (ICD10-I70.0). Electronically Signed   By: Jackey Loge  D.O.   On: 09/15/2022 15:32    Cardiac Studies   12/04/21 TTE  IMPRESSIONS     1. Left ventricular ejection fraction, by estimation, is 55 to 60%. The  left ventricle has normal function. The left ventricle has no regional  wall motion abnormalities. There is mild left ventricular hypertrophy.  Left ventricular diastolic parameters  were normal.   2. Right ventricular systolic function is mildly reduced. The right  ventricular size is mildly enlarged.   3. Left atrial size was moderately dilated.   4. Right atrial size was mildly dilated.   5. The mitral valve is abnormal. Mild mitral valve regurgitation. No  evidence of mitral stenosis. Moderate mitral annular calcification.   6. 23 mm Edwards porcine bioprosthetic valve no AR mean gradient improved  since TTE done 11/1818 19.2-> 16.5 mmHg. The aortic valve has been  repaired/replaced. There is mild calcification of the aortic valve. There  is mild thickening of the aortic valve.   Aortic valve regurgitation is not visualized. Mild aortic valve stenosis.  There is a 23 mm Edwards porcine valve present in the aortic position.   7. The inferior vena cava is normal in size with greater than 50%  respiratory variability, suggesting right atrial pressure of 3 mmHg.   FINDINGS   Left Ventricle: Left ventricular ejection fraction, by estimation, is 55  to 60%. The left ventricle has normal function. The left ventricle has no  regional wall motion abnormalities. The left ventricular internal cavity  size was normal in size. There is   mild left ventricular hypertrophy. Left ventricular diastolic parameters  were normal.   Right Ventricle: The right ventricular size is mildly enlarged. Right  vetricular wall thickness was not assessed. Right ventricular systolic  function is mildly reduced.   Left Atrium: Left atrial size was moderately dilated.   Right Atrium: Right atrial size was mildly dilated.   Pericardium: There is no  evidence of pericardial effusion.   Mitral Valve: The mitral valve is abnormal. There is moderate thickening  of the mitral valve leaflet(s). There is moderate calcification of the  mitral valve leaflet(s). Moderate mitral annular calcification. Mild  mitral valve regurgitation. No evidence  of mitral valve stenosis.   Tricuspid Valve: The tricuspid valve is normal in structure. Tricuspid  valve regurgitation is mild . No evidence of tricuspid stenosis.   Aortic Valve: 23 mm Edwards porcine bioprosthetic valve  no AR mean  gradient improved since TTE done 11/1818 19.2-> 16.5 mmHg. The aortic valve  has been repaired/replaced. There is mild calcification of the aortic  valve. There is mild thickening of the  aortic valve. Aortic valve regurgitation is not visualized. Mild aortic  stenosis is present. Aortic valve mean gradient measures 16.5 mmHg. Aortic  valve peak gradient measures 29.7 mmHg. Aortic valve area, by VTI measures  1.45 cm. There is a 23 mm  Edwards porcine valve present in the aortic position.   Pulmonic Valve: The pulmonic valve was normal in structure. Pulmonic valve  regurgitation is not visualized. No evidence of pulmonic stenosis.   Aorta: The aortic root is normal in size and structure.   Venous: The inferior vena cava is normal in size with greater than 50%  respiratory variability, suggesting right atrial pressure of 3 mmHg.   IAS/Shunts: No atrial level shunt detected by color flow Doppler.   12/03/21 LHC    Ost LAD to Prox LAD lesion is 80% stenosed.   Mid LAD lesion is 100% stenosed.   Ost Cx lesion is 80% stenosed.   Prox RCA to Mid RCA lesion is 100% stenosed.   Ost Ramus-1 lesion is 90% stenosed.   Ost Ramus-2 lesion is 60% stenosed.   Prox Cx-2 lesion is 100% stenosed.   Prox Cx-1 lesion is 80% stenosed.   LIMA and is normal in caliber.   SVG and is large.   SVG and is large.   SVG and is large.   The graft exhibits no disease.   The graft  exhibits minimal luminal irregularities.   The graft exhibits minimal luminal irregularities.   The graft exhibits no disease.   3 vessel obstructive CAD.  All grafts are still patent No significant change from 2020.   Plan: recommend medical therapy. Some of his angina could be explained by disease in the native LCx proximal to a relatively small OM. Again this hasn't changed significantly.   Patient Profile     Troy Banks is a 82 y.o. 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 Mahala Menghini. Presented to the ED on 4/11 with complaints of centralized chest pain and shortness of breath. States earlier this morning he went for a veterans physical exam and felt well. Later after getting home he was sitting resting and developed centralized chest pain around 10 AM. Ultimately presented to the ED for further evaluation.   Assessment & Plan    Afib with RVR  Presented with new onset atrial fibrillation, duration is unclear.  Did have episode of postop atrial fibrillation back in 2016.  Was on amiodarone until January 2017 as he was maintaining sinus rhythm and this was stopped.  Given his history of cytochrome p450 CYP 2D6 genetic variant rate controlling strategy is difficult. Decision made yesterday to utilize Diltiazem.   Rate control improved today, but still intermittent RVR. Given ongoing patient/family concern about drug metabolism, will up-titrate Diltiazem infusion to see if patient tolerates, then consider transition to oral Diltiazem, perhaps SR 120mg  BID. Would prefer lower rates for echo today also. With liver metabolism concerns, long-term strategy of watchman/ablation should be considered. Given long half-life of primidone, if this were held to  ultimately facilitate initiation of DOAC, would need lovenox or other bridge in  the meantime. Other alternative would be to use Warfarin. Though not preferred, this might be better strategy to minimize changes to general med regimen. Will discuss with pharmacy and MD. Continue heparin at this time Echocardiogram pending today  Chest pain CAD s/p CABG x4  Patient with chest tightness in the setting of RVR. Troponin 29->34->39 most consistent with tachy-mediated demand ischemia. Will continue to monitor symptoms as rate control improves.  Hypokalemia  Continue replacement to maintain K>4.  Hypertension  BP initially elevated, now near-normal on diltiazem. Will continue to hold amlodipine.  Hyperlipidemia  Continue daily statin therapy  Hypothyroidism  Continue synthroid.  Lab Results  Component Value Date   TSH 4.44 06/28/2022    Tremors  As above, in order to potentially facilitate watchman and ablation, patient will need anticoagulation. Will need to work with medicine team to consider alternatives to Primidone in order to facilitate initiation of DOAC.      For questions or updates, please contact [facility] Please consult [email or web address] for contact info under        Signed, Perlie Gold, [name]  09/16/2022, 7:44 AM

## 2022-09-16 NOTE — TOC Benefit Eligibility Note (Signed)
Patient Product/process development scientist completed.    The patient is currently admitted and upon discharge could be taking enoxaparin 100 mg/ml inj.  The current 21 day co-pay is $100.00.   The patient is insured through [employer]   This test claim was processed through [facility]- copay amounts may vary at other pharmacies due to pharmacy/plan contracts, or as the patient moves through the different stages of their insurance plan.  Roland Earl, CPHT Pharmacy Patient Advocate Specialist [facility] Patient Advocate Team Direct Number: [phone]  Fax: [phone]

## 2022-09-17 ENCOUNTER — Other Ambulatory Visit (HOSPITAL_COMMUNITY): Payer: Self-pay

## 2022-09-17 ENCOUNTER — Encounter: Payer: Self-pay | Admitting: Neurology

## 2022-09-17 DIAGNOSIS — Z953 Presence of xenogenic heart valve: Secondary | ICD-10-CM

## 2022-09-17 DIAGNOSIS — E8809 Other disorders of plasma-protein metabolism, not elsewhere classified: Secondary | ICD-10-CM

## 2022-09-17 DIAGNOSIS — I4892 Unspecified atrial flutter: Secondary | ICD-10-CM | POA: Diagnosis not present

## 2022-09-17 DIAGNOSIS — I25709 Atherosclerosis of coronary artery bypass graft(s), unspecified, with unspecified angina pectoris: Secondary | ICD-10-CM

## 2022-09-17 DIAGNOSIS — T426X5A Adverse effect of other antiepileptic and sedative-hypnotic drugs, initial encounter: Secondary | ICD-10-CM

## 2022-09-17 LAB — CBC WITH DIFFERENTIAL/PLATELET
Abs Immature Granulocytes: 0.04 10*3/uL (ref 0.00–0.07)
Basophils Absolute: 0.1 10*3/uL (ref 0.0–0.1)
Basophils Relative: 1 %
Eosinophils Absolute: 0.2 10*3/uL (ref 0.0–0.5)
Eosinophils Relative: 3 %
HCT: 45.6 % (ref 39.0–52.0)
Hemoglobin: 15 g/dL (ref 13.0–17.0)
Immature Granulocytes: 1 %
Lymphocytes Relative: 29 %
Lymphs Abs: 2 10*3/uL (ref 0.7–4.0)
MCH: 30.7 pg (ref 26.0–34.0)
MCHC: 32.9 g/dL (ref 30.0–36.0)
MCV: 93.4 fL (ref 80.0–100.0)
Monocytes Absolute: 0.8 10*3/uL (ref 0.1–1.0)
Monocytes Relative: 11 %
Neutro Abs: 3.9 10*3/uL (ref 1.7–7.7)
Neutrophils Relative %: 55 %
Platelets: 167 10*3/uL (ref 150–400)
RBC: 4.88 MIL/uL (ref 4.22–5.81)
RDW: 13.7 % (ref 11.5–15.5)
WBC: 7 10*3/uL (ref 4.0–10.5)
nRBC: 0 % (ref 0.0–0.2)

## 2022-09-17 LAB — COMPREHENSIVE METABOLIC PANEL
ALT: 26 U/L (ref 0–44)
AST: 23 U/L (ref 15–41)
Albumin: 3.6 g/dL (ref 3.5–5.0)
Alkaline Phosphatase: 50 U/L (ref 38–126)
Anion gap: 11 (ref 5–15)
BUN: 18 mg/dL (ref 8–23)
CO2: 23 mmol/L (ref 22–32)
Calcium: 9.2 mg/dL (ref 8.9–10.3)
Chloride: 101 mmol/L (ref 98–111)
Creatinine, Ser: 1.05 mg/dL (ref 0.61–1.24)
GFR, Estimated: >60 mL/min (ref >60)
Glucose, Bld: 137 mg/dL — ABNORMAL HIGH (ref 70–99)
Potassium: 4.2 mmol/L (ref 3.5–5.1)
Sodium: 135 mmol/L (ref 135–145)
Total Bilirubin: 1.7 mg/dL — ABNORMAL HIGH (ref 0.3–1.2)
Total Protein: 6.3 g/dL — ABNORMAL LOW (ref 6.5–8.1)

## 2022-09-17 LAB — MAGNESIUM: Magnesium: 1.9 mg/dL (ref 1.7–2.4)

## 2022-09-17 MED ORDER — DILTIAZEM HCL ER COATED BEADS 240 MG PO CP24
240.0000 mg | ORAL_CAPSULE | Freq: Every day | ORAL | 0 refills | Status: DC
  Filled 2022-09-17: qty 90, 90d supply, fill #0

## 2022-09-17 MED ORDER — ENOXAPARIN SODIUM 100 MG/ML IJ SOSY
150.0000 mg | PREFILLED_SYRINGE | INTRAMUSCULAR | 0 refills | Status: DC
  Filled 2022-09-17 (×2): qty 30, 20d supply, fill #0
  Filled 2022-09-17: qty 4.5, 3d supply, fill #0
  Filled 2022-09-17: qty 30, 20d supply, fill #0

## 2022-09-17 MED ORDER — DILTIAZEM HCL ER COATED BEADS 240 MG PO CP24: 240.0000 mg | ORAL_CAPSULE | Freq: Every day | ORAL | 1 refills | Status: DC

## 2022-09-17 MED ORDER — ENOXAPARIN SODIUM 150 MG/ML IJ SOSY
150.0000 mg | PREFILLED_SYRINGE | INTRAMUSCULAR | 0 refills | Status: DC
  Filled 2022-09-17: qty 2, 2d supply, fill #0

## 2022-09-17 MED ORDER — DILTIAZEM HCL ER COATED BEADS 240 MG PO CP24
240.0000 mg | ORAL_CAPSULE | Freq: Every day | ORAL | 1 refills | Status: DC
  Filled 2022-09-17: qty 3, 3d supply, fill #0
  Filled 2022-09-17: qty 30, 30d supply, fill #0
  Filled 2022-09-17: qty 3, 3d supply, fill #0

## 2022-09-17 MED ORDER — ENOXAPARIN SODIUM 100 MG/ML IJ SOSY
PREFILLED_SYRINGE | Freq: Two times a day (BID) | INTRAMUSCULAR | 0 refills | Status: DC
  Filled 2022-09-17: qty 6, 3d supply, fill #0

## 2022-09-17 MED ORDER — ENOXAPARIN SODIUM 100 MG/ML IJ SOSY
200.0000 mg | PREFILLED_SYRINGE | INTRAMUSCULAR | 0 refills | Status: DC
  Filled 2022-09-17: qty 40, 20d supply, fill #0

## 2022-09-17 MED ORDER — ENOXAPARIN SODIUM 100 MG/ML IJ SOSY: 200.0000 mg | PREFILLED_SYRINGE | INTRAMUSCULAR | 0 refills | Status: DC

## 2022-09-17 NOTE — Progress Notes (Deleted)
PROGRESS NOTE   Troy Banks  ZOX:[id number] DOB: February 06, 1941 DOA: 09/15/2022 PCP: Corwin Levins, MD  Brief Narrative:   82 year old white male CAD CABG AVR 2016 CYP 450 CYP2D6 poor  metabolization meds-last cardiac cath 12/03/2021 stable HFpEF HTN hypothyroid DM TY 2 Gout right knee Chronic migraines with no response to Botox Chronic low back pain follows with Dr. Magnus Ivan ESI injections Dr. Alvester Morin Essential tremor status post focused ultrasound to right VIM follows with Dr. Gladstone Pih there on primidone OSA on CPAP Paroxysmal A-fib CHADVASC >3 not on any anticoagulation nor on rate control presumably because of CYP 450 issues   Developed sudden onset chest heaviness with radiation to the back discomfort nausea early in the morning on 4/11-had difficulty getting up no chest pain no fever Has had angina last week and took!  6 NITROGLYCERIN!!  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  Watchman device?  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 Loletha Grayer, also discussed with pharmacy staff Not a good candidate for beta-blockade-May be able to use Cardizem-cardiology recommendations appreciated  Essential tremor status post focused  ultrasound to right VIM--follows at [facility] with Dr. Gladstone Pih Discontinue primidone after discussion with local neurologist Dr. Lurena Joiner [name]--will follow-up in the outpatient setting with Dr. Golden Circle her insights  DM TY 2-diet controlled CBGs predominantly below 180 eating 100% of meals-discontinue all sliding scale coverage and outpatient follow-up with A1c   CAD status post CABG with AVR repair 2016 Currently on lower dose of Imdur 15 amlodipine 10 aspirin 81 (this every other day) Crestor 5 nightly to be resumed on discharge  Chronic issues include gout, migraines without response to Botox as well as hypothyroidism  DVT prophylaxis: Lovenox/heparin as per cardiology Code Status: DNR confirmed with wife previously Family Communication: Discussed with wife at the bedside, message to his daughter who is a physician Disposition:  Status is: Inpatient Remains inpatient appropriate because:    Requires durable rate control and discussion about meds on discharge with cardiology  Subjective:  Coherent playing crossword puzzle conversant no further recurrence of A-fib Wife is in the room We had a good discussion about the rate control strategy  Will watch his heart rate overnight to ensure he does not go back into A-fib RVR nor drop heart rate precipitously overnight  Objective: Vitals:   09/16/22 1940 09/16/22 2302 09/17/22 0300 09/17/22 0807  BP: (!) 155/74 (!) 154/67 (!) 152/75 132/65  Pulse: (!) 53 60 60 (!) 54  Resp: Temp: 98.3 F (36.8 C) 98 F (36.7 C) 98.8 F (37.1 C) 98.4 F (36.9 C)  TempSrc: Oral Oral Oral Oral  SpO2: 96% 96% 98% 95%  Weight:      Height:  Intake/Output Summary (Last 24 hours) at 09/17/2022 1402 Last data filed at 09/17/2022 0809 Gross per 24 hour  Intake 473.68 ml  Output 1950 ml  Net -1476.32 ml    Filed Weights   09/15/22 1521  Weight: 96 kg    Examination:  Thick neck Mallampati 4 arcus senilis Neck soft supple No  JVD at 30 degrees Chest is clear without wheeze rales rhonchi midline chest scar Abdomen obese nontender no rebound No lower extremity edema  Data Reviewed: personally reviewed   CBC    Component Value Date/Time   WBC 7.0 09/17/2022 0104   RBC 4.88 09/17/2022 0104   HGB 15.0 09/17/2022 0104   HGB 15.9 10/19/2018 1411   HCT 45.6 09/17/2022 0104   HCT 46.9 10/19/2018 1411   PLT 167 09/17/2022 0104   PLT 146 (L) 10/19/2018 1411   MCV 93.4 09/17/2022 0104   MCV 89 10/19/2018 1411   MCH 30.7 09/17/2022 0104   MCHC 32.9 09/17/2022 0104   RDW 13.7 09/17/2022 0104   RDW 13.1 10/19/2018 1411   LYMPHSABS 2.0 09/17/2022 0104   MONOABS 0.8 09/17/2022 0104   EOSABS 0.2 09/17/2022 0104   BASOSABS 0.1 09/17/2022 0104      Latest Ref Rng & Units 09/17/2022    1:04 AM 09/16/2022    1:58 AM 09/15/2022    3:07 PM  CMP  Glucose 70 - 99 mg/dL 491  791  505   BUN 8 - 23 mg/dL 18  14  11    Creatinine 0.61 - 1.24 mg/dL 6.97  9.48  0.16   Sodium 135 - 145 mmol/L 135  135  142   Potassium 3.5 - 5.1 mmol/L 4.2  3.6  2.8   Chloride 98 - 111 mmol/L 101  104  108   CO2 22 - 32 mmol/L 23  21  21    Calcium 8.9 - 10.3 mg/dL 9.2  8.7  7.8   Total Protein 6.5 - 8.1 g/dL 6.3  6.2  5.0   Total Bilirubin 0.3 - 1.2 mg/dL 1.7  1.6  1.2   Alkaline Phos 38 - 126 U/L 50  56  46   AST 15 - 41 U/L 23  19  20    ALT 0 - 44 U/L 26  27  22       Radiology Studies: ECHOCARDIOGRAM COMPLETE  Result Date: 09/16/2022    ECHOCARDIOGRAM REPORT   Patient Name:   Troy Banks Date of Exam: 09/16/2022 Medical Rec #:  [id number]          Height:       68.0 in Accession #:    [id number]         Weight:       211.6 lb Date of Birth:  [birth year]          BSA:          2.093 m Patient Age:    82 years           BP:           140/95 mmHg Patient Gender: M                  HR:           104 bpm. Exam Location:  Inpatient Procedure: 2D Echo, Color Doppler and Cardiac Doppler Indications:    Atrial Flutter  History:         Patient has prior history of Echocardiogram examinations, most  recent 12/04/2021. CHF, CAD, Prior CABG, Aortic Valve Disease,                 Arrythmias:Atrial Flutter, Signs/Symptoms:Chest Pain; Risk                 Factors:Hypertension, Dyslipidemia and Diabetes. OSA, 23mm                 Edwards Porcine AVR.  Sonographer:    Milbert Coulter Referring Phys: Rhetta Mura IMPRESSIONS  1. Left ventricular ejection fraction, by estimation, is 60 to 65%. The left ventricle has normal function. The left ventricle has no regional wall motion abnormalities. There is moderate left ventricular hypertrophy. Left ventricular diastolic parameters are consistent with Grade I diastolic dysfunction (impaired relaxation).  2. Right ventricular systolic function is normal. The right ventricular size is normal. There is normal pulmonary artery systolic pressure.  3. No evidence of mitral valve regurgitation.  4. S/p 23 mm Edwards Porcine AVR. V max 2.3 m/s. Mean gradient 12 mmHg. DI 0.45. No PVL. Normal prosthesis. Aortic valve regurgitation is not visualized.  5. Aortic aorta is not well visualized.  6. The inferior vena cava is dilated in size with >50% respiratory variability, suggesting right atrial pressure of 8 mmHg. FINDINGS  Left Ventricle: Left ventricular ejection fraction, by estimation, is 60 to 65%. The left ventricle has normal function. The left ventricle has no regional wall motion abnormalities. The left ventricular internal cavity size was normal in size. There is  moderate left ventricular hypertrophy. Left ventricular diastolic parameters are consistent with Grade I diastolic dysfunction (impaired relaxation). Right Ventricle: The right ventricular size is normal. Right ventricular systolic function is normal. There is normal pulmonary artery systolic pressure. The tricuspid regurgitant velocity is 1.96 m/s, and with an assumed right atrial pressure of 8 mmHg,  the estimated right ventricular  systolic pressure is 23.4 mmHg. Left Atrium: Left atrial size was normal in size. Right Atrium: Right atrial size was normal in size. Pericardium: There is no evidence of pericardial effusion. Mitral Valve: Mild mitral annular calcification. No evidence of mitral valve regurgitation. Tricuspid Valve: Tricuspid valve regurgitation is trivial. Aortic Valve: S/p 23 mm Edwards Porcine AVR. V max 2.3 m/s. Mean gradient 12 mmHg. DI 0.45. No PVL. Normal prosthesis. Aortic valve regurgitation is not visualized. Aortic valve mean gradient measures 12.0 mmHg. Aortic valve peak gradient measures 22.9 mmHg. Pulmonic Valve: Pulmonic valve regurgitation is not visualized. Aorta: Aorta is not well visualized. Venous: The inferior vena cava is dilated in size with greater than 50% respiratory variability, suggesting right atrial pressure of 8 mmHg. IAS/Shunts: No atrial level shunt detected by color flow Doppler.  LEFT VENTRICLE PLAX 2D LVIDd:         3.50 cm     Diastology LVIDs:         2.50 cm     LV e' medial:    4.79 cm/s LV PW:         1.60 cm     LV E/e' medial:  23.4 LV IVS:        1.30 cm     LV e' lateral:   6.74 cm/s                            LV E/e' lateral: 16.6  LV Volumes (MOD) LV vol d, MOD A2C: 54.2 ml LV vol s, MOD A2C: 25.0 ml LV SV MOD A2C:  29.2 ml LEFT ATRIUM           Index        RIGHT ATRIUM           Index LA diam:      4.30 cm 2.05 cm/m   RA Area:     14.90 cm LA Vol (A2C): 54.5 ml 26.03 ml/m  RA Volume:   33.40 ml  15.95 ml/m LA Vol (A4C): 23.9 ml 11.42 ml/m  AORTIC VALVE AV Vmax:           239.33 cm/s AV Vmean:          157.333 cm/s AV VTI:            0.524 m AV Peak Grad:      22.9 mmHg AV Mean Grad:      12.0 mmHg LVOT Vmax:         109.00 cm/s LVOT Vmean:        74.700 cm/s LVOT VTI:          0.238 m LVOT/AV VTI ratio: 0.45 MITRAL VALVE                TRICUSPID VALVE MV Area (PHT): 2.09 cm     TR Peak grad:   15.4 mmHg MV Decel Time: 363 msec     TR Vmax:        196.00 cm/s MV E velocity:  112.00 cm/s MV A velocity: 110.00 cm/s  SHUNTS MV E/A ratio:  1.02         Systemic VTI: 0.24 m Carolan Clines Electronically signed by Carolan Clines Signature Date/Time: 09/16/2022/3:59:41 PM    Final    DG Chest Port 1 View  Result Date: 09/15/2022 CLINICAL DATA:  Provided history: Chest pain. AFib with RVR. EXAM: PORTABLE CHEST 1 VIEW COMPARISON:  Prior chest radiographs 04/20/2022 and earlier. FINDINGS: Prior median sternotomy, CABG and aortic valve replacement. Heart size within normal limits. Aortic atherosclerosis. Central pulmonary vascular congestion without overt pulmonary edema. No appreciable airspace consolidation. No evidence of pleural effusion or pneumothorax. No acute osseous abnormality identified. Chronic, healed right rib fractures. ACDF hardware. IMPRESSION: 1. Central pulmonary vascular congestion without overt pulmonary edema. 2.  Aortic Atherosclerosis (ICD10-I70.0). Electronically Signed   By: Jackey Loge D.O.   On: 09/15/2022 15:32     Scheduled Meds:  allopurinol  300 mg Oral Daily   vitamin C  1,000 mg Oral Daily   aspirin EC  81 mg Oral QODAY   cholecalciferol  1,000 Units Oral QODAY   diltiazem  240 mg Oral Daily   enoxaparin (LOVENOX) injection  100 mg Subcutaneous Q12H   famotidine  40 mg Oral Daily   furosemide  40 mg Oral QODAY   isosorbide mononitrate  15 mg Oral Daily   levothyroxine  25 mcg Oral QAC breakfast   potassium chloride  40 mEq Oral BID   Continuous Infusions:     LOS: 2 days   Time spent: 40  Rhetta Mura, MD [facility] To contact the attending provider between 7A-7P or the covering provider during after hours 7P-7A, please log into the web site [email or web address] and access using universal [place] password for that web site. If you do not have the password, please call the hospital operator.  09/17/2022, 2:02 PM

## 2022-09-17 NOTE — Progress Notes (Signed)
Progress Note  Patient Name: Troy Banks Date of Encounter: 09/17/2022  Primary Cardiologist: Peter [place], MD  Subjective   No acute events overnight, telemetry reviewed and shows sinus bradycardia.  Inpatient Medications    Scheduled Meds:  allopurinol  300 mg Oral Daily   vitamin C  1,000 mg Oral Daily   aspirin EC  81 mg Oral QODAY   cholecalciferol  1,000 Units Oral QODAY   diltiazem  240 mg Oral Daily   enoxaparin (LOVENOX) injection  100 mg Subcutaneous Q12H   famotidine  40 mg Oral Daily   furosemide  40 mg Oral QODAY   isosorbide mononitrate  15 mg Oral Daily   levothyroxine  25 mcg Oral QAC breakfast   potassium chloride  40 mEq Oral BID   primidone  50 mg Oral BID   Continuous Infusions:  PRN Meds: acetaminophen, tiZANidine   Vital Signs    Vitals:   09/16/22 1940 09/16/22 2302 09/17/22 0300 09/17/22 0807  BP: (!) 155/74 (!) 154/67 (!) 152/75 132/65  Pulse: (!) 53 60 60 (!) 54  Resp: Temp: 98.3 F (36.8 C) 98 F (36.7 C) 98.8 F (37.1 C) 98.4 F (36.9 C)  TempSrc: Oral Oral Oral Oral  SpO2: 96% 96% 98% 95%  Weight:      Height:        Intake/Output Summary (Last 24 hours) at 09/17/2022 1200 Last data filed at 09/17/2022 0809 Gross per 24 hour  Intake 1193.68 ml  Output 2200 ml  Net -1006.32 ml   Filed Weights   09/15/22 1521  Weight: 96 kg    Telemetry     Personally reviewed, NSR HR 50s  ECG   EKG not performed today  Physical Exam   GEN: No acute distress.   Neck: No JVD. Cardiac: RRR, no murmur, rub, or gallop.  Respiratory: Nonlabored. Clear to auscultation bilaterally. GI: Soft, nontender, bowel sounds present. MS: No edema; No deformity. Neuro:  Nonfocal. Psych: Alert and oriented x 3. Normal affect.  Labs    Chemistry Recent Labs  Lab 09/15/22 1507 09/16/22 0158 09/17/22 0104  NA 142 135 135  K 2.8* 3.6 4.2  CL 108 104 101  CO2 21* 21* 23  GLUCOSE 126* 157* 137*  BUN CREATININE 0.80 0.88 1.05  CALCIUM 7.8* 8.7* 9.2  PROT 5.0* 6.2* 6.3*  ALBUMIN 3.0* 3.6 3.6  AST ALT ALKPHOS 46 56 50  BILITOT 1.2 1.6* 1.7*  GFRNONAA >60 >60 >60  ANIONGAP Hematology Recent Labs  Lab 09/15/22 1507 09/16/22 0158 09/17/22 0104  WBC 7.0 8.2 7.0  RBC 4.15* 5.03 4.88  HGB 13.1 15.9 15.0  HCT 38.5* 46.6 45.6  MCV 92.8 92.6 93.4  MCH 31.6 31.6 30.7  MCHC 34.0 34.1 32.9  RDW 13.8 13.8 13.7  PLT 141* 154 167    Cardiac Enzymes Recent Labs  Lab 09/15/22 1507 09/15/22 1829 09/15/22 2008  TROPONINIHS 29* 34* 39*    BNPNo results for input(s): "BNP", "PROBNP" in the last 168 hours.   DDimerNo results for input(s): "DDIMER" in the last 168 hours.   Radiology    ECHOCARDIOGRAM COMPLETE  Result Date: 09/16/2022    ECHOCARDIOGRAM REPORT   Patient Name:   Troy Banks Date of Exam: 09/16/2022 Medical Rec #:  [id number]          Height:  68.0 in Accession #:    [id number]         Weight:       211.6 lb Date of Birth:  October 04, 1940          BSA:          2.093 m Patient Age:    82 years           BP:           140/95 mmHg Patient Gender: M                  HR:           104 bpm. Exam Location:  Inpatient Procedure: 2D Echo, Color Doppler and Cardiac Doppler Indications:    Atrial Flutter  History:        Patient has prior history of Echocardiogram examinations, most                 recent 12/04/2021. CHF, CAD, Prior CABG, Aortic Valve Disease,                 Arrythmias:Atrial Flutter, Signs/Symptoms:Chest Pain; Risk                 Factors:Hypertension, Dyslipidemia and Diabetes. OSA, 75mm                 Edwards Porcine AVR.  Sonographer:    Milbert Coulter Referring Phys: Rhetta Mura IMPRESSIONS  1. Left ventricular ejection fraction, by estimation, is 60 to 65%. The left ventricle has normal function. The left ventricle has no regional wall motion abnormalities. There is moderate left ventricular hypertrophy. Left  ventricular diastolic parameters are consistent with Grade I diastolic dysfunction (impaired relaxation).  2. Right ventricular systolic function is normal. The right ventricular size is normal. There is normal pulmonary artery systolic pressure.  3. No evidence of mitral valve regurgitation.  4. S/p 23 mm Edwards Porcine AVR. V max 2.3 m/s. Mean gradient 12 mmHg. DI 0.45. No PVL. Normal prosthesis. Aortic valve regurgitation is not visualized.  5. Aortic aorta is not well visualized.  6. The inferior vena cava is dilated in size with >50% respiratory variability, suggesting right atrial pressure of 8 mmHg. FINDINGS  Left Ventricle: Left ventricular ejection fraction, by estimation, is 60 to 65%. The left ventricle has normal function. The left ventricle has no regional wall motion abnormalities. The left ventricular internal cavity size was normal in size. There is  moderate left ventricular hypertrophy. Left ventricular diastolic parameters are consistent with Grade I diastolic dysfunction (impaired relaxation). Right Ventricle: The right ventricular size is normal. Right ventricular systolic function is normal. There is normal pulmonary artery systolic pressure. The tricuspid regurgitant velocity is 1.96 m/s, and with an assumed right atrial pressure of 8 mmHg,  the estimated right ventricular systolic pressure is 23.4 mmHg. Left Atrium: Left atrial size was normal in size. Right Atrium: Right atrial size was normal in size. Pericardium: There is no evidence of pericardial effusion. Mitral Valve: Mild mitral annular calcification. No evidence of mitral valve regurgitation. Tricuspid Valve: Tricuspid valve regurgitation is trivial. Aortic Valve: S/p 23 mm Edwards Porcine AVR. V max 2.3 m/s. Mean gradient 12 mmHg. DI 0.45. No PVL. Normal prosthesis. Aortic valve regurgitation is not visualized. Aortic valve mean gradient measures 12.0 mmHg. Aortic valve peak gradient measures 22.9 mmHg. Pulmonic Valve: Pulmonic  valve regurgitation is not visualized. Aorta: Aorta is not well visualized. Venous: The inferior vena cava is dilated in size with  greater than 50% respiratory variability, suggesting right atrial pressure of 8 mmHg. IAS/Shunts: No atrial level shunt detected by color flow Doppler.  LEFT VENTRICLE PLAX 2D LVIDd:         3.50 cm     Diastology LVIDs:         2.50 cm     LV e' medial:    4.79 cm/s LV PW:         1.60 cm     LV E/e' medial:  23.4 LV IVS:        1.30 cm     LV e' lateral:   6.74 cm/s                            LV E/e' lateral: 16.6  LV Volumes (MOD) LV vol d, MOD A2C: 54.2 ml LV vol s, MOD A2C: 25.0 ml LV SV MOD A2C:     29.2 ml LEFT ATRIUM           Index        RIGHT ATRIUM           Index LA diam:      4.30 cm 2.05 cm/m   RA Area:     14.90 cm LA Vol (A2C): 54.5 ml 26.03 ml/m  RA Volume:   33.40 ml  15.95 ml/m LA Vol (A4C): 23.9 ml 11.42 ml/m  AORTIC VALVE AV Vmax:           239.33 cm/s AV Vmean:          157.333 cm/s AV VTI:            0.524 m AV Peak Grad:      22.9 mmHg AV Mean Grad:      12.0 mmHg LVOT Vmax:         109.00 cm/s LVOT Vmean:        74.700 cm/s LVOT VTI:          0.238 m LVOT/AV VTI ratio: 0.45 MITRAL VALVE                TRICUSPID VALVE MV Area (PHT): 2.09 cm     TR Peak grad:   15.4 mmHg MV Decel Time: 363 msec     TR Vmax:        196.00 cm/s MV E velocity: 112.00 cm/s MV A velocity: 110.00 cm/s  SHUNTS MV E/A ratio:  1.02         Systemic VTI: 0.24 m Carolan Clines Electronically signed by Carolan Clines Signature Date/Time: 09/16/2022/3:59:41 PM    Final    DG Chest Port 1 View  Result Date: 09/15/2022 CLINICAL DATA:  Provided history: Chest pain. AFib with RVR. EXAM: PORTABLE CHEST 1 VIEW COMPARISON:  Prior chest radiographs 04/20/2022 and earlier. FINDINGS: Prior median sternotomy, CABG and aortic valve replacement. Heart size within normal limits. Aortic atherosclerosis. Central pulmonary vascular congestion without overt pulmonary edema. No appreciable airspace  consolidation. No evidence of pleural effusion or pneumothorax. No acute osseous abnormality identified. Chronic, healed right rib fractures. ACDF hardware. IMPRESSION: 1. Central pulmonary vascular congestion without overt pulmonary edema. 2.  Aortic Atherosclerosis (ICD10-I70.0). Electronically Signed   By: Jackey Loge D.O.   On: 09/15/2022 15:32    Cardiac Studies  Echocardiogram on 09/16/2022 LVEF 60 to 65% G1 DD LV systolic function is normal Status post 23 mm Edwards porcine AVR    Assessment & Plan    # Paroxysmal  A-fib with RVR, currently in NSR -Patient had a new diagnosis of postop A-fib in 2016 for which he was on amiodarone till 06/2015 maintaining NSR and hence Amio was stopped.  He had a recurrent A-fib with RVR this admission requiring diltiazem drip, transfer and to p.o. diltiazem 240 milligram once daily.  Telemetry overnight showed sinus bradycardia, HR 54 bpm. 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 Currently on subcutaneous Lovenox, can start Coumadin and follow-up with outpatient anticoagulation clinic for frequent INR checks. Goal INR should be between 2 and 3. He has an appointment with the EP in 11/2022 to discuss about rhythm control strategy, with ablation. -Outpatient OSA evaluation, if not tested before  # Primidone use: Unclear why patient has been on primidone long-term.  Needs to be weaned off.  # CAD S/P CABG x 4: Patient had chest tightness in setting of A-fib with RVR with mild elevation in troponins for which LHC this admission showed patent grafts. Imdur was added.  Continue aspirin and high intensity statin.  # S/p 23 mm Edwards porcine AVR: Normal prosthesis per echocardiogram on 09/16/2022.    I have spent a total of 30 minutes with patient reviewing chart , telemetry, EKGs, labs and examining patient as well as establishing an assessment and  plan that was discussed with the patient.  > 50% of time was spent in direct patient care.     Signed, Marjo Bicker, MD  09/17/2022, 12:00 PM

## 2022-09-17 NOTE — Progress Notes (Signed)
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 Magnus Ivan ESI injections Dr. Alvester Morin Essential tremor status post focused ultrasound to right VIM follows with Dr. Gladstone Pih there on primidone OSA on CPAP Paroxysmal A-fib CHADVASC >3 not on any anticoagulation nor on rate control presumably because of CYP 450 issues   Developed sudden onset chest heaviness with radiation to the back discomfort nausea early in the morning on 4/11-had difficulty getting up no chest pain no fever Has had angina last week and took!  6 NITROGLYCERIN!!  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  Watchman device?  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 Loletha Grayer 4/12, also discussed with pharmacy staff Not a good candidate for beta-blockade-May be able to use Cardizem-cardiology recommendations appreciated  Essential tremor status post  focused ultrasound to right VIM--follows at [facility] with Dr. Gladstone Pih Discontinue primidone after discussion with local neurologist Dr. Lurena Joiner [name]--will follow-up in the outpatient setting with Dr. Golden Circle her insights  DM TY 2-diet controlled CBGs predominantly below 180 eating 100% of meals-discontinue all sliding scale coverage and outpatient follow-up with A1c   CAD status post CABG with AVR repair 2016 Currently on lower dose of Imdur 15 amlodipine 10 aspirin 81 (this every other day) Crestor 5 nightly to be resumed on discharge  Chronic issues include gout, migraines without response to Botox as well as hypothyroidism  DVT prophylaxis: Lovenox/heparin as per cardiology Code Status: DNR confirmed with wife previously Family Communication: Discussed with wife at the bedside, message to his daughter who is a physician Disposition:  Status is: Inpatient Remains inpatient appropriate because:    Requires durable rate control and discussion about meds on discharge with cardiology  Subjective:  Coherent playing crossword puzzle conversant no further recurrence of A-fib Wife is in the room We had a good discussion about the rate control strategy  Will watch his heart rate overnight to ensure he does not go back into A-fib RVR nor drop heart rate precipitously overnight  Objective: Vitals:   09/17/22 0807 09/17/22 1400 09/17/22 1403 09/17/22 1423  BP: 132/65 (!) 173/58 (!) 173/58 (!) 141/61  Pulse: (!) 54 60 64 (!) 55  Resp: Temp: 98.4 F (36.9 C)  98.2 F (36.8 C)   TempSrc: Oral  Oral   SpO2: 95% 98% 97%   Weight:      Height:  Intake/Output Summary (Last 24 hours) at 09/17/2022 1454 Last data filed at 09/17/2022 0809 Gross per 24 hour  Intake 473.68 ml  Output 1650 ml  Net -1176.32 ml    Filed Weights   09/15/22 1521  Weight: 96 kg    Examination:  Thick neck Mallampati 4 arcus senilis Neck soft supple Chest is clear without wheeze rales  rhonchi midline chest scar Abdomen obese nontender no rebound No lower extremity edema  Data Reviewed: personally reviewed   CBC    Component Value Date/Time   WBC 7.0 09/17/2022 0104   RBC 4.88 09/17/2022 0104   HGB 15.0 09/17/2022 0104   HGB 15.9 10/19/2018 1411   HCT 45.6 09/17/2022 0104   HCT 46.9 10/19/2018 1411   PLT 167 09/17/2022 0104   PLT 146 (L) 10/19/2018 1411   MCV 93.4 09/17/2022 0104   MCV 89 10/19/2018 1411   MCH 30.7 09/17/2022 0104   MCHC 32.9 09/17/2022 0104   RDW 13.7 09/17/2022 0104   RDW 13.1 10/19/2018 1411   LYMPHSABS 2.0 09/17/2022 0104   MONOABS 0.8 09/17/2022 0104   EOSABS 0.2 09/17/2022 0104   BASOSABS 0.1 09/17/2022 0104      Latest Ref Rng & Units 09/17/2022    1:04 AM 09/16/2022    1:58 AM 09/15/2022    3:07 PM  CMP  Glucose 70 - 99 mg/dL 119  147  829   BUN 8 - 23 mg/dL Creatinine 0.61 - 1.24 mg/dL 5.62  1.30  8.65   Sodium 135 - 145 mmol/L 135  135  142   Potassium 3.5 - 5.1 mmol/L 4.2  3.6  2.8   Chloride 98 - 111 mmol/L 101  104  108   CO2 22 - 32 mmol/L Calcium 8.9 - 10.3 mg/dL 9.2  8.7  7.8   Total Protein 6.5 - 8.1 g/dL 6.3  6.2  5.0   Total Bilirubin 0.3 - 1.2 mg/dL 1.7  1.6  1.2   Alkaline Phos 38 - 126 U/L 50  56  46   AST 15 - 41 U/L ALT 0 - 44 U/L Radiology Studies: ECHOCARDIOGRAM COMPLETE  Result Date: 09/16/2022    ECHOCARDIOGRAM REPORT   Patient Name:   Troy Banks Date of Exam: 09/16/2022 Medical Rec #:  [id number]          Height:       68.0 in Accession #:    [id number]         Weight:       211.6 lb Date of Birth:  03/22/1941          BSA:          2.093 m Patient Age:    81 years           BP:           140/95 mmHg Patient Gender: M                  HR:           104 bpm. Exam Location:  Inpatient Procedure: 2D Echo, Color Doppler and Cardiac Doppler Indications:    Atrial Flutter  History:        Patient has prior history of Echocardiogram examinations,  most  recent 12/04/2021. CHF, CAD, Prior CABG, Aortic Valve Disease,                 Arrythmias:Atrial Flutter, Signs/Symptoms:Chest Pain; Risk                 Factors:Hypertension, Dyslipidemia and Diabetes. OSA, 33mm                 Edwards Porcine AVR.  Sonographer:    Milbert Coulter Referring Phys: Rhetta Mura IMPRESSIONS  1. Left ventricular ejection fraction, by estimation, is 60 to 65%. The left ventricle has normal function. The left ventricle has no regional wall motion abnormalities. There is moderate left ventricular hypertrophy. Left ventricular diastolic parameters are consistent with Grade I diastolic dysfunction (impaired relaxation).  2. Right ventricular systolic function is normal. The right ventricular size is normal. There is normal pulmonary artery systolic pressure.  3. No evidence of mitral valve regurgitation.  4. S/p 23 mm Edwards Porcine AVR. V max 2.3 m/s. Mean gradient 12 mmHg. DI 0.45. No PVL. Normal prosthesis. Aortic valve regurgitation is not visualized.  5. Aortic aorta is not well visualized.  6. The inferior vena cava is dilated in size with >50% respiratory variability, suggesting right atrial pressure of 8 mmHg. FINDINGS  Left Ventricle: Left ventricular ejection fraction, by estimation, is 60 to 65%. The left ventricle has normal function. The left ventricle has no regional wall motion abnormalities. The left ventricular internal cavity size was normal in size. There is  moderate left ventricular hypertrophy. Left ventricular diastolic parameters are consistent with Grade I diastolic dysfunction (impaired relaxation). Right Ventricle: The right ventricular size is normal. Right ventricular systolic function is normal. There is normal pulmonary artery systolic pressure. The tricuspid regurgitant velocity is 1.96 m/s, and with an assumed right atrial pressure of 8 mmHg,  the estimated right ventricular systolic pressure is 23.4 mmHg. Left Atrium: Left atrial  size was normal in size. Right Atrium: Right atrial size was normal in size. Pericardium: There is no evidence of pericardial effusion. Mitral Valve: Mild mitral annular calcification. No evidence of mitral valve regurgitation. Tricuspid Valve: Tricuspid valve regurgitation is trivial. Aortic Valve: S/p 23 mm Edwards Porcine AVR. V max 2.3 m/s. Mean gradient 12 mmHg. DI 0.45. No PVL. Normal prosthesis. Aortic valve regurgitation is not visualized. Aortic valve mean gradient measures 12.0 mmHg. Aortic valve peak gradient measures 22.9 mmHg. Pulmonic Valve: Pulmonic valve regurgitation is not visualized. Aorta: Aorta is not well visualized. Venous: The inferior vena cava is dilated in size with greater than 50% respiratory variability, suggesting right atrial pressure of 8 mmHg. IAS/Shunts: No atrial level shunt detected by color flow Doppler.  LEFT VENTRICLE PLAX 2D LVIDd:         3.50 cm     Diastology LVIDs:         2.50 cm     LV e' medial:    4.79 cm/s LV PW:         1.60 cm     LV E/e' medial:  23.4 LV IVS:        1.30 cm     LV e' lateral:   6.74 cm/s                            LV E/e' lateral: 16.6  LV Volumes (MOD) LV vol d, MOD A2C: 54.2 ml LV vol s, MOD A2C: 25.0 ml LV SV MOD A2C:  29.2 ml LEFT ATRIUM           Index        RIGHT ATRIUM           Index LA diam:      4.30 cm 2.05 cm/m   RA Area:     14.90 cm LA Vol (A2C): 54.5 ml 26.03 ml/m  RA Volume:   33.40 ml  15.95 ml/m LA Vol (A4C): 23.9 ml 11.42 ml/m  AORTIC VALVE AV Vmax:           239.33 cm/s AV Vmean:          157.333 cm/s AV VTI:            0.524 m AV Peak Grad:      22.9 mmHg AV Mean Grad:      12.0 mmHg LVOT Vmax:         109.00 cm/s LVOT Vmean:        74.700 cm/s LVOT VTI:          0.238 m LVOT/AV VTI ratio: 0.45 MITRAL VALVE                TRICUSPID VALVE MV Area (PHT): 2.09 cm     TR Peak grad:   15.4 mmHg MV Decel Time: 363 msec     TR Vmax:        196.00 cm/s MV E velocity: 112.00 cm/s MV A velocity: 110.00 cm/s  SHUNTS MV E/A  ratio:  1.02         Systemic VTI: 0.24 m Carolan Clines Electronically signed by Carolan Clines Signature Date/Time: 09/16/2022/3:59:41 PM    Final    DG Chest Port 1 View  Result Date: 09/15/2022 CLINICAL DATA:  Provided history: Chest pain. AFib with RVR. EXAM: PORTABLE CHEST 1 VIEW COMPARISON:  Prior chest radiographs 04/20/2022 and earlier. FINDINGS: Prior median sternotomy, CABG and aortic valve replacement. Heart size within normal limits. Aortic atherosclerosis. Central pulmonary vascular congestion without overt pulmonary edema. No appreciable airspace consolidation. No evidence of pleural effusion or pneumothorax. No acute osseous abnormality identified. Chronic, healed right rib fractures. ACDF hardware. IMPRESSION: 1. Central pulmonary vascular congestion without overt pulmonary edema. 2.  Aortic Atherosclerosis (ICD10-I70.0). Electronically Signed   By: Jackey Loge D.O.   On: 09/15/2022 15:32     Scheduled Meds:  allopurinol  300 mg Oral Daily   vitamin C  1,000 mg Oral Daily   aspirin EC  81 mg Oral QODAY   cholecalciferol  1,000 Units Oral QODAY   diltiazem  240 mg Oral Daily   enoxaparin (LOVENOX) injection  100 mg Subcutaneous Q12H   famotidine  40 mg Oral Daily   furosemide  40 mg Oral QODAY   isosorbide mononitrate  15 mg Oral Daily   levothyroxine  25 mcg Oral QAC breakfast   potassium chloride  40 mEq Oral BID   Continuous Infusions:     LOS: 2 days   Time spent: 76  Rhetta Mura, MD [facility] To contact the attending provider between 7A-7P or the covering provider during after hours 7P-7A, please log into the web site [email or web address] and access using universal [place] password for that web site. If you do not have the password, please call the hospital operator.  09/17/2022, 2:54 PM

## 2022-09-18 DIAGNOSIS — I4892 Unspecified atrial flutter: Secondary | ICD-10-CM | POA: Diagnosis not present

## 2022-09-18 MED ORDER — AMLODIPINE BESYLATE 5 MG PO TABS
5.0000 mg | ORAL_TABLET | Freq: Every day | ORAL | Status: DC
  Administered 2022-09-18: 5 mg via ORAL
  Filled 2022-09-18: qty 1

## 2022-09-18 MED ORDER — HYDRALAZINE HCL 20 MG/ML IJ SOLN: 5.0000 mg | Freq: Three times a day (TID) | INTRAMUSCULAR | Status: DC | PRN

## 2022-09-18 NOTE — Discharge Summary (Addendum)
Physician Discharge Summary  RAKEEN Banks BJY:[id number] DOB: May 07, 1941 DOA: 09/15/2022  PCP: Corwin Levins, MD  Admit date: 09/15/2022 Discharge date: 09/18/2022  Time spent: 47 minutes  Recommendations for Outpatient Follow-up:  Requires outpatient follow-up with cardiology Med changes-initiation of Cardizem to 40, discontinuation of primidone completely this hospitalization, initiation of Lovenox at variable dosing for 3 weeks from discharge until patient's primidone washes out and then can start DOAC in the outpatient setting per cardiology Needs Chem-12 CBC magnesium in 3 to 4 days-CC PCP, [facility] cardiology Needs TSH in 3 weeks, needs A1c at the same time  Discharge Diagnoses:  MAIN problem for hospitalization   2:1 flutter/Fib now in NSR, Chad2vasc2=6 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 Tyrone Sage  Please see below for itemized issues addressed in HOpsital- refer to other progress notes for clarity if needed  Discharge Condition: Improved  Diet recommendation: Diabetic heart healthy  Filed Weights   09/15/22 1521  Weight: 96 kg    History of present illness:  82 year old white male CAD CABG AVR 2016 CYP 450 CYP2D6 poor  metabolization meds-last cardiac cath 12/03/2021 stable HFpEF HTN hypothyroid DM TY 2 Gout right knee Chronic migraines with no response to Botox Chronic low back pain follows with Dr. Magnus Ivan ESI injections Dr. Alvester Morin Essential tremor status post focused ultrasound to right VIM follows with Dr. Gladstone Pih there on primidone OSA on CPAP Paroxysmal A-fib CHADVASC >3 not on any anticoagulation nor on rate control presumably because of CYP 450 issues   Developed sudden onset chest heaviness with radiation to the back discomfort nausea early in the morning on 4/11-had  difficulty getting up no chest pain no fever Has had angina week prior to admission requiring several doses of nitro He continued to have discomfort could not hide this from his wife and ultimately decision was made to come to the ED   He was found to be in the EMS in A-fib with RVR given nitroglycerin X.3 ASA 325 4/12 echocardiogram shows EF 60-65% functioning AVR with normal prosthesis 4/12 EP consulted  Hospital Course:  Paroxysmal 2-1 a flutter now converted to sinus rhythm Cardizem gtt./-->240 CD with mild bradycardia at times -anticoagulation Lovenox 150 SQ daily for 3 weeks after discussions as below-?  Watchman device?  Flutter ablation-as outpatient appreciate EP and cardiology input-CC Dr. Elberta Fortis, CC Dr. 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 Loletha Grayer 4/12, also discussed with pharmacy staff Not a good candidate for beta-blockade-May be able to use Cardizem-discharging on Cardizem CD   Essential tremor status post focused ultrasound to right VIM--follows at [facility] with Dr. Gladstone Pih Discontinue primidone after discussion with local neurologist Dr. Lurena Joiner [name]--will follow-up in the outpatient setting with Dr. Golden Circle her insights-CC Dr. Arbutus Leas   DM TY 2-diet controlled CBGs predominantly below 180 eating 100% of meals-discontinue all sliding scale coverage and outpatient follow-up with A1c    CAD status post CABG with AVR repair 2016 Currently on lower dose of Imdur 15 amlodipine 10 aspirin 81 (this every other day) Crestor 5 nightly to be resumed on discharge Stop amlodipibne on dc--Add HCTZ as OOP  if still elevated BP oon f/u with cards   Chronic issues include gout, migraines without response to Botox as well as hypothyroidism   Discharge Exam: Vitals:   09/18/22 0759  09/18/22 0826  BP: (!) 172/78   Pulse: (!) 54   Resp: 17   Temp:  98 F (36.7 C)  SpO2: 98%     Subj on day of d/c   Awake coherent did not sleep well did not use BiPAP Blood pressure is little high this morning he however feels okay Heart rate is appropriately elevated when he moves around No nausea no vomiting  General Exam on discharge  EOMI NCAT no focal deficit no icterus no pallor no wheeze no rales or rhonchi S1-S2 in sinus rhythm currently with some PVCs Abdomen obese nontender no rebound no guarding.  No lower extremity edema No wheeze no rales no rhonchi ROM intact  Discharge Instructions   Discharge Instructions     Ambulatory Referral for Lung Cancer Scre   Complete by: As directed       Allergies as of 09/18/2022       Reactions   Acyclovir And Related Other (See Comments)   Accumulates and causes stroke-like side-effects due to cytochrome P450 enzyme deficiency   Benzodiazepines Other (See Comments)   Patient states he gets stroke symptoms with benzo's.    Compazine [prochlorperazine Maleate] Other (See Comments)   Accumulates and causes stroke-like side-effects due to cytochrome P450 enzyme deficiency   Coreg [carvedilol] Other (See Comments)   Accumulates and causes stroke-like side-effects due to cytochrome P450 enzyme deficiency Patient poor metabolizer of CYP2D6 - Coreg undergoes extensive hepatic (including 2D6)   Flomax [tamsulosin Hcl] Other (See Comments)   Accumulates and causes stroke-like side-effects due to cytochrome P450 enzyme deficiency   Lisinopril Other (See Comments)   Hyperkalemia. 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   Mobic [meloxicam] Other (See Comments)   Accumulates and causes stroke-like side-effects due to cytochrome P450 enzyme deficiency   Morphine And Related Other (See Comments)   Per pt's wife morphine also contraindicated due to p450 enzyme deficiency.     Ondansetron Other (See Comments)   Accumulates and causes stroke-like side-effects due to cytochrome P450 enzyme deficiency   Oxycodone Other (See Comments)   Accumulates and causes stroke-like side-effects due to cytochrome P450 enzyme deficiency   Promethazine Other (See Comments)   Accumulates and causes stroke-like side-effects due to cytochrome P450 enzyme deficiency   Sertraline Hcl Other (See Comments)   Accumulates and causes stroke-like side-effects due to cytochrome P450 enzyme deficiency   Tramadol Other (See Comments)   Accumulates and causes stroke-like side-effects due to cytochrome P450 enzyme deficiency   Atorvastatin Other (See Comments)   MYALGIAS   Colchicine Other (See Comments)   ataxia   Fentanyl Other (See Comments)   Accumulates and cause stroke like symptoms        Medication List     STOP taking these medications    acetaminophen 500 MG tablet Commonly known as: TYLENOL   amLODipine 10 MG tablet Commonly known as: NORVASC   calcium carbonate 750 MG chewable tablet Commonly known as: TUMS EX   primidone 50 MG tablet Commonly known as: MYSOLINE       TAKE these medications    allopurinol 300 MG tablet Commonly known as: ZYLOPRIM TAKE 1 TABLET BY MOUTH EVERY DAY   aspirin EC 81 MG tablet Take 81 mg by mouth every other day. Swallow  whole.   cyanocobalamin 1000 MCG tablet Commonly known as: VITAMIN B12 Take 1,000 mcg by mouth every other day.   diltiazem 240 MG 24 hr capsule Commonly known as: Cartia XT Take 1 capsule (240 mg total) by mouth daily for 4 days.   diltiazem 240 MG 24 hr capsule Commonly known as: CARDIZEM CD Take 1 capsule (240 mg total) by mouth daily.   enoxaparin 100 MG/ML injection Commonly known as: LOVENOX Inject 1.5 mLs (150 mg total) into the skin daily for 20 days.   enoxaparin 150 MG/ML injection Commonly known as: LOVENOX Inject 1 mL (150 mg total) into the skin daily for 2 days.   famotidine 40 MG  tablet Commonly known as: PEPCID Take 1 tablet (40 mg total) by mouth daily.   furosemide 40 MG tablet Commonly known as: LASIX TAKE 1 TABLET BY MOUTH EVERY OTHER DAY   isosorbide mononitrate 30 MG 24 hr tablet Commonly known as: IMDUR Take 1 tablet (30 mg total) by mouth daily.   levothyroxine 25 MCG tablet Commonly known as: SYNTHROID TAKE 1 TABLET BY MOUTH EVERY DAY WITH BREAKFAST What changed: See the new instructions.   LUBRICANT EYE DROPS OP Place 1 drop into both eyes 2 (two) times daily as needed (dry/irritated eyes.).   nitroGLYCERIN 0.4 MG SL tablet Commonly known as: NITROSTAT PLACE 1 TABLET UNDER THE TONGUE EVERY 5 MINUTES AS NEEDED FOR CHEST PAIN. What changed: See the new instructions.   rosuvastatin 10 MG tablet Commonly known as: CRESTOR TAKE 1/2 TABLET BY MOUTH EVERY DAY What changed: when to take this   tiZANidine 4 MG tablet Commonly known as: ZANAFLEX Take 2 mg by mouth every 8 (eight) hours as needed for muscle spasms.   Turmeric 500 MG Caps Take 500 mg by mouth daily.   vitamin C 1000 MG tablet Take 1,000 mg by mouth daily.   Vitamin D-3 25 MCG (1000 UT) Caps Take 1,000 Units by mouth every other day.       Allergies  Allergen Reactions   Acyclovir And Related Other (See Comments)    Accumulates and causes stroke-like side-effects due to cytochrome P450 enzyme deficiency   Benzodiazepines Other (See Comments)    Patient states he gets stroke symptoms with benzo's.    Compazine [Prochlorperazine Maleate] Other (See Comments)    Accumulates and causes stroke-like side-effects due to cytochrome P450 enzyme deficiency   Coreg [Carvedilol] Other (See Comments)    Accumulates and causes stroke-like side-effects due to cytochrome P450 enzyme deficiency Patient poor metabolizer of CYP2D6 - Coreg undergoes extensive hepatic (including 2D6)   Flomax [Tamsulosin Hcl] Other (See Comments)    Accumulates and causes stroke-like side-effects due to  cytochrome P450 enzyme deficiency   Lisinopril Other (See Comments)    Hyperkalemia. 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   Mobic [Meloxicam] Other (See Comments)    Accumulates and causes stroke-like side-effects due to cytochrome P450 enzyme deficiency   Morphine And Related Other (See Comments)    Per pt's wife morphine also contraindicated due to p450 enzyme deficiency.     Ondansetron Other (See Comments)    Accumulates and causes stroke-like side-effects due to cytochrome P450 enzyme deficiency    Oxycodone Other (See Comments)    Accumulates and causes stroke-like side-effects due to cytochrome P450 enzyme deficiency   Promethazine Other (See Comments)    Accumulates and causes stroke-like side-effects due to cytochrome P450 enzyme deficiency   Sertraline Hcl Other (See Comments)  Accumulates and causes stroke-like side-effects due to cytochrome P450 enzyme deficiency   Tramadol Other (See Comments)    Accumulates and causes stroke-like side-effects due to cytochrome P450 enzyme deficiency   Atorvastatin Other (See Comments)    MYALGIAS   Colchicine Other (See Comments)    ataxia   Fentanyl Other (See Comments)    Accumulates and cause stroke like symptoms      The results of significant diagnostics from this hospitalization (including imaging, microbiology, ancillary and laboratory) are listed below for reference.    Significant Diagnostic Studies: ECHOCARDIOGRAM COMPLETE  Result Date: 09/16/2022    ECHOCARDIOGRAM REPORT   Patient Name:   Troy Banks Date of Exam: 09/16/2022 Medical Rec #:  [id number]          Height:       68.0 in Accession #:    [id number]         Weight:       211.6 lb Date of Birth:  Dec 11, 1940          BSA:          2.093 m Patient Age:    81 years           BP:           140/95 mmHg Patient Gender: M                  HR:           104 bpm. Exam Location:  Inpatient Procedure: 2D Echo, Color  Doppler and Cardiac Doppler Indications:    Atrial Flutter  History:        Patient has prior history of Echocardiogram examinations, most                 recent 12/04/2021. CHF, CAD, Prior CABG, Aortic Valve Disease,                 Arrythmias:Atrial Flutter, Signs/Symptoms:Chest Pain; Risk                 Factors:Hypertension, Dyslipidemia and Diabetes. OSA, 29mm                 Edwards Porcine AVR.  Sonographer:    Milbert Coulter Referring Phys: Rhetta Mura IMPRESSIONS  1. Left ventricular ejection fraction, by estimation, is 60 to 65%. The left ventricle has normal function. The left ventricle has no regional wall motion abnormalities. There is moderate left ventricular hypertrophy. Left ventricular diastolic parameters are consistent with Grade I diastolic dysfunction (impaired relaxation).  2. Right ventricular systolic function is normal. The right ventricular size is normal. There is normal pulmonary artery systolic pressure.  3. No evidence of mitral valve regurgitation.  4. S/p 23 mm Edwards Porcine AVR. V max 2.3 m/s. Mean gradient 12 mmHg. DI 0.45. No PVL. Normal prosthesis. Aortic valve regurgitation is not visualized.  5. Aortic aorta is not well visualized.  6. The inferior vena cava is dilated in size with >50% respiratory variability, suggesting right atrial pressure of 8 mmHg. FINDINGS  Left Ventricle: Left ventricular ejection fraction, by estimation, is 60 to 65%. The left ventricle has normal function. The left ventricle has no regional wall motion abnormalities. The left ventricular internal cavity size was normal in size. There is  moderate left ventricular hypertrophy. Left ventricular diastolic parameters are consistent with Grade I diastolic dysfunction (impaired relaxation). Right Ventricle: The right ventricular size is normal. Right ventricular systolic function is normal. There is normal pulmonary artery  systolic pressure. The tricuspid regurgitant velocity is 1.96 m/s, and with an  assumed right atrial pressure of 8 mmHg,  the estimated right ventricular systolic pressure is 23.4 mmHg. Left Atrium: Left atrial size was normal in size. Right Atrium: Right atrial size was normal in size. Pericardium: There is no evidence of pericardial effusion. Mitral Valve: Mild mitral annular calcification. No evidence of mitral valve regurgitation. Tricuspid Valve: Tricuspid valve regurgitation is trivial. Aortic Valve: S/p 23 mm Edwards Porcine AVR. V max 2.3 m/s. Mean gradient 12 mmHg. DI 0.45. No PVL. Normal prosthesis. Aortic valve regurgitation is not visualized. Aortic valve mean gradient measures 12.0 mmHg. Aortic valve peak gradient measures 22.9 mmHg. Pulmonic Valve: Pulmonic valve regurgitation is not visualized. Aorta: Aorta is not well visualized. Venous: The inferior vena cava is dilated in size with greater than 50% respiratory variability, suggesting right atrial pressure of 8 mmHg. IAS/Shunts: No atrial level shunt detected by color flow Doppler.  LEFT VENTRICLE PLAX 2D LVIDd:         3.50 cm     Diastology LVIDs:         2.50 cm     LV e' medial:    4.79 cm/s LV PW:         1.60 cm     LV E/e' medial:  23.4 LV IVS:        1.30 cm     LV e' lateral:   6.74 cm/s                            LV E/e' lateral: 16.6  LV Volumes (MOD) LV vol d, MOD A2C: 54.2 ml LV vol s, MOD A2C: 25.0 ml LV SV MOD A2C:     29.2 ml LEFT ATRIUM           Index        RIGHT ATRIUM           Index LA diam:      4.30 cm 2.05 cm/m   RA Area:     14.90 cm LA Vol (A2C): 54.5 ml 26.03 ml/m  RA Volume:   33.40 ml  15.95 ml/m LA Vol (A4C): 23.9 ml 11.42 ml/m  AORTIC VALVE AV Vmax:           239.33 cm/s AV Vmean:          157.333 cm/s AV VTI:            0.524 m AV Peak Grad:      22.9 mmHg AV Mean Grad:      12.0 mmHg LVOT Vmax:         109.00 cm/s LVOT Vmean:        74.700 cm/s LVOT VTI:          0.238 m LVOT/AV VTI ratio: 0.45 MITRAL VALVE                TRICUSPID VALVE MV Area (PHT): 2.09 cm     TR Peak grad:   15.4  mmHg MV Decel Time: 363 msec     TR Vmax:        196.00 cm/s MV E velocity: 112.00 cm/s MV A velocity: 110.00 cm/s  SHUNTS MV E/A ratio:  1.02         Systemic VTI: 0.24 m Carolan Clines Electronically signed by Carolan Clines Signature Date/Time: 09/16/2022/3:59:41 PM    Final    DG Chest Port 1 [address]  Result Date: 09/15/2022 CLINICAL DATA:  Provided history: Chest pain. AFib with RVR. EXAM: PORTABLE CHEST 1 VIEW COMPARISON:  Prior chest radiographs 04/20/2022 and earlier. FINDINGS: Prior median sternotomy, CABG and aortic valve replacement. Heart size within normal limits. Aortic atherosclerosis. Central pulmonary vascular congestion without overt pulmonary edema. No appreciable airspace consolidation. No evidence of pleural effusion or pneumothorax. No acute osseous abnormality identified. Chronic, healed right rib fractures. ACDF hardware. IMPRESSION: 1. Central pulmonary vascular congestion without overt pulmonary edema. 2.  Aortic Atherosclerosis (ICD10-I70.0). Electronically Signed   By: Jackey Loge D.O.   On: 09/15/2022 15:32    Microbiology: No results found for this or any previous visit (from the past 240 hour(s)).   Labs: Basic Metabolic Panel: Recent Labs  Lab 09/15/22 1507 09/15/22 1829 09/16/22 0158 09/17/22 0104  NA 142  --  135 135  K 2.8*  --  3.6 4.2  CL 108  --  104 101  CO2 21*  --  21* 23  GLUCOSE 126*  --  157* 137*  BUN 11  --  14 18  CREATININE 0.80  --  0.88 1.05  CALCIUM 7.8*  --  8.7* 9.2  MG  --  1.7  --  1.9   Liver Function Tests: Recent Labs  Lab 09/15/22 1507 09/16/22 0158 09/17/22 0104  AST ALT ALKPHOS 46 56 50  BILITOT 1.2 1.6* 1.7*  PROT 5.0* 6.2* 6.3*  ALBUMIN 3.0* 3.6 3.6   No results for input(s): "LIPASE", "AMYLASE" in the last 168 hours. No results for input(s): "AMMONIA" in the last 168 hours. CBC: Recent Labs  Lab 09/15/22 1507 09/16/22 0158 09/17/22 0104  WBC 7.0 8.2 7.0  NEUTROABS 4.6 5.1 3.9  HGB 13.1 15.9  15.0  HCT 38.5* 46.6 45.6  MCV 92.8 92.6 93.4  PLT 141* 154 167   Cardiac Enzymes: No results for input(s): "CKTOTAL", "CKMB", "CKMBINDEX", "TROPONINI" in the last 168 hours. BNP: BNP (last 3 results) No results for input(s): "BNP" in the last 8760 hours.  ProBNP (last 3 results) No results for input(s): "PROBNP" in the last 8760 hours.  CBG: No results for input(s): "GLUCAP" in the last 168 hours.     Signed:  Rhetta Mura MD   [facility] 09/18/2022, 8:32 AM

## 2022-09-18 NOTE — Progress Notes (Signed)
Refused cpap, cpap in room. Patient stated the hospital one has not been comfortable and likes his home one and is getting discharged tomorrow.

## 2022-09-18 NOTE — Plan of Care (Signed)

## 2022-09-19 ENCOUNTER — Other Ambulatory Visit (HOSPITAL_COMMUNITY): Payer: Self-pay

## 2022-09-19 ENCOUNTER — Telehealth: Payer: Self-pay | Admitting: Cardiology

## 2022-09-19 NOTE — Telephone Encounter (Signed)
Per discharge summary, pt to start Lovenox 150mg  SQ once daily for 3 weeks while primidone washes out of his system. Then plan is to start on DOAC to be initiated at outpatient cardiology appt however he does not have an appt scheduled in this time frame.   Lovenox 150mg  SQ once daily is correct dosing but the rx sent to the pharmacy was wrong - inpatient MD sent in an rx for 100mg  syringe with instructions to inject 150mg  which is not possible.   [facility] contacted a hospitalist who has clarified the rx - pharmacy is ordering the 150mg  Lovenox once daily dose. Wife is a Engineer, civil (consulting) and is familiar with injections.  Discharge summary also has pt to recheck labs in 3-4 days but again no follow up was scheduled.  Will forward to our scheduling team - pt needs APP follow up post discharge. Will need repeat labs and can discuss transition to DOAC at that time.

## 2022-09-19 NOTE — Telephone Encounter (Signed)
Returned call to the Troy Banks who is listed on pt's info to speak to & give information.  She states that the discharge MD stated that the pt was supposed to take lovenox 200mg  daily not 150mg  daily or 100mg  daily and she needed to know which is correct. Advised that I can see the 100mg  & 150mg  lovenox prescriptions and that our Physician-Dr [place] did not order the medication therefore if she was told he needed more than she would need to clarify with that MD. Advised that lovenox 200mg  is not made and that is what is note on medlist.   Also, she is aware that the [facility]ticoagulation Clinic did not order this and was trying to assist with basic lovenox information. This needs to be evaluated by Physician for specific lovenox dosing.  She stated she would call the pharmacy to see if the order was correct.

## 2022-09-19 NOTE — Telephone Encounter (Signed)
  Pt c/o medication issue:  1. Name of Medication:   enoxaparin (LOVENOX) 150 MG/ML injection    2. How are you currently taking this medication (dosage and times per day)? Inject 1.5 mLs (150 mg total) into the skin daily for 20 days.   3. Are you having a reaction (difficulty breathing--STAT)? No   4. What is your medication issue? Pt's wife calling, she said she has question about this medications before giving it to the pt. It is due now and if the nurse can call her back as soon as possible. She requested it to send this message urgent

## 2022-09-20 ENCOUNTER — Encounter: Payer: Self-pay | Admitting: *Deleted

## 2022-09-20 ENCOUNTER — Telehealth: Payer: Self-pay | Admitting: *Deleted

## 2022-09-20 ENCOUNTER — Other Ambulatory Visit: Payer: Self-pay

## 2022-09-20 DIAGNOSIS — G43719 Chronic migraine without aura, intractable, without status migrainosus: Secondary | ICD-10-CM

## 2022-09-20 NOTE — Telephone Encounter (Signed)
Sent to pain clinic.

## 2022-09-20 NOTE — Telephone Encounter (Signed)
Spoke to patient's wife.She received call from our pharmacist.She understands medication dosage.Post hospital appointment scheduled with Carlos Levering NP 4/23 at 1:55 pm.

## 2022-09-20 NOTE — Transitions of Care (Post Inpatient/ED Visit) (Signed)
09/20/2022  Name: Troy Banks MRN: [id number] DOB: 1940-08-28  Today's TOC FU Call Status: Today's TOC FU Call Status:: Successful TOC FU Call Competed TOC FU Call Complete Date: 09/20/22  Transition Care Management Follow-up Telephone Call Date of Discharge: 09/18/22 Discharge Facility: [facility]) Type of Discharge: Inpatient Admission Primary Inpatient Discharge Diagnosis:: A-Fib/ Flutter with RVR How have you been since you were released from the hospital?: Better (per significant other, Troy Banks: "He is doing okay and I am right here with him.  I am a RN, and yes-- we had a lot of calls yesterday about the correct dosing of the Lovenox, but that is all straightened out now") Any questions or concerns?: Yes Patient Questions/Concerns:: Ongoing management of chronic migraine/ treatment options Patient Questions/Concerns Addressed: Other: (encouraged to discuss with care providers at upcoming provider appointments)  Items Reviewed: Did you receive and understand the discharge instructions provided?: Yes (thoroughly reviewed with patient's significant other who verbalizes good understanding of same) Medications obtained and verified?: Yes (Medications Reviewed) (Significant other declines medication review; states reviewed thoroughly yesterday with cardiology team; 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; confirmed patient obtained/ is taking all newly Rx'd medications as instructed; significant other manages medications) Any new allergies since your discharge?: No Dietary orders reviewed?: Yes Type of Diet Ordered:: "Heart Healthy/ low salt Do you have support at home?: Yes People in Home: significant other Name of Support/Comfort Primary Source: Reports independent in self-care activities; significant other assists as/ if needed/ indicated  Home Care and Equipment/Supplies: Were Home Health Services Ordered?: No Any new equipment or medical supplies ordered?: No  Functional  Questionnaire: Do you need assistance with bathing/showering or dressing?: No (significant other assists as/ if needed/ indicated) Do you need assistance with meal preparation?: No (significant other assists as/ if needed/ indicated) Do you need assistance with eating?: No Do you have difficulty maintaining continence: No Do you need assistance with getting out of bed/getting out of a chair/moving?: No Do you have difficulty managing or taking your medications?: No (significant other manages medications)  Follow up appointments reviewed: PCP Follow-up appointment confirmed?: Yes (care coordination outreach in real-time with scheduling care guide to successfully schedule hospital follow up PCP appointment 09/23/22) Date of PCP follow-up appointment?: 09/23/22 Follow-up Provider: PCP Specialist Hospital Follow-up appointment confirmed?: Yes (significant other reports she is in close contact with cardiology provider around cardiology needs) Date of Specialist follow-up appointment?: 11/03/22 Follow-Up Specialty Provider:: cardiology Do you need transportation to your follow-up appointment?: No Do you understand care options if your condition(s) worsen?: Yes-patient verbalized understanding  SDOH Interventions Today    Flowsheet Row Most Recent Value  SDOH Interventions   Food Insecurity Interventions Intervention Not Indicated  Transportation Interventions Intervention Not Indicated  [significant other provides transportation]      TOC Interventions Today    Flowsheet Row Most Recent Value  TOC Interventions   TOC Interventions Discussed/Reviewed TOC Interventions Discussed, Arranged PCP follow up within 7 days/Care Guide scheduled  [Significant other declines need for ongoing/ further care coordination outreach,  no care coordination needs identified at time of TOC call today,  provided my direct contact information should questions/ concerns/ needs arise post-TOC call]       Interventions Today    Flowsheet Row Most Recent Value  Chronic Disease   Chronic disease during today's visit Atrial Fibrillation (AFib)  General Interventions   General Interventions Discussed/Reviewed General Interventions Discussed, Doctor Visits  Doctor Visits Discussed/Reviewed Doctor Visits Discussed, Specialist, PCP  Education  Interventions   Education Provided Provided Education  Provided Verbal Education On Other  [importance of having updated/ current DPR completed]  Nutrition Interventions   Nutrition Discussed/Reviewed Nutrition Discussed  Pharmacy Interventions   Pharmacy Dicussed/Reviewed Pharmacy Topics Discussed  Safety Interventions   Safety Discussed/Reviewed Safety Discussed  [confirmed does not currently use assistive devices]      Caryl Pina, RN, BSN, CCRN Alumnus RN CM Care Coordination/ Transition of Care- [facility] Care Management [phone]: direct office

## 2022-09-21 ENCOUNTER — Other Ambulatory Visit: Payer: Self-pay | Admitting: Cardiology

## 2022-09-21 ENCOUNTER — Other Ambulatory Visit: Payer: Self-pay | Admitting: Internal Medicine

## 2022-09-23 ENCOUNTER — Ambulatory Visit (INDEPENDENT_AMBULATORY_CARE_PROVIDER_SITE_OTHER): Payer: Medicare Other | Admitting: Internal Medicine

## 2022-09-23 ENCOUNTER — Encounter: Payer: Self-pay | Admitting: Internal Medicine

## 2022-09-23 VITALS — BP 134/78 | HR 55 | Temp 98.0°F | Ht 68.0 in | Wt 210.0 lb

## 2022-09-23 DIAGNOSIS — I4892 Unspecified atrial flutter: Secondary | ICD-10-CM | POA: Diagnosis not present

## 2022-09-23 DIAGNOSIS — E119 Type 2 diabetes mellitus without complications: Secondary | ICD-10-CM

## 2022-09-23 DIAGNOSIS — H02821 Cysts of right upper eyelid: Secondary | ICD-10-CM | POA: Insufficient documentation

## 2022-09-23 DIAGNOSIS — E559 Vitamin D deficiency, unspecified: Secondary | ICD-10-CM

## 2022-09-23 DIAGNOSIS — Z7729 Contact with and (suspected ) exposure to other hazardous substances: Secondary | ICD-10-CM | POA: Insufficient documentation

## 2022-09-23 DIAGNOSIS — H0289 Other specified disorders of eyelid: Secondary | ICD-10-CM | POA: Insufficient documentation

## 2022-09-23 DIAGNOSIS — I10 Essential (primary) hypertension: Secondary | ICD-10-CM | POA: Diagnosis not present

## 2022-09-23 DIAGNOSIS — E785 Hyperlipidemia, unspecified: Secondary | ICD-10-CM

## 2022-09-23 DIAGNOSIS — H2513 Age-related nuclear cataract, bilateral: Secondary | ICD-10-CM | POA: Insufficient documentation

## 2022-09-23 LAB — CBC WITH DIFFERENTIAL/PLATELET
Basophils Absolute: 0.1 10*3/uL (ref 0.0–0.1)
Basophils Relative: 1.3 % (ref 0.0–3.0)
Eosinophils Absolute: 0.2 10*3/uL (ref 0.0–0.7)
Eosinophils Relative: 2.3 % (ref 0.0–5.0)
HCT: 44.5 % (ref 39.0–52.0)
Hemoglobin: 15.1 g/dL (ref 13.0–17.0)
Lymphocytes Relative: 26.3 % (ref 12.0–46.0)
Lymphs Abs: 1.7 10*3/uL (ref 0.7–4.0)
MCHC: 34 g/dL (ref 30.0–36.0)
MCV: 94.1 fl (ref 78.0–100.0)
Monocytes Absolute: 0.7 10*3/uL (ref 0.1–1.0)
Monocytes Relative: 11.5 % (ref 3.0–12.0)
Neutro Abs: 3.8 10*3/uL (ref 1.4–7.7)
Neutrophils Relative %: 58.6 % (ref 43.0–77.0)
Platelets: 173 10*3/uL (ref 150.0–400.0)
RBC: 4.73 Mil/uL (ref 4.22–5.81)
RDW: 14.3 % (ref 11.5–15.5)
WBC: 6.4 10*3/uL (ref 4.0–10.5)

## 2022-09-23 LAB — BASIC METABOLIC PANEL
BUN: 19 mg/dL (ref 6–23)
CO2: 26 mEq/L (ref 19–32)
Calcium: 9.7 mg/dL (ref 8.4–10.5)
Chloride: 105 mEq/L (ref 96–112)
Creatinine, Ser: 1.08 mg/dL (ref 0.40–1.50)
GFR: 65.16 mL/min (ref >60.00)
Glucose, Bld: 203 mg/dL — ABNORMAL HIGH (ref 70–99)
Potassium: 4.5 mEq/L (ref 3.5–5.1)
Sodium: 140 mEq/L (ref 135–145)

## 2022-09-23 LAB — HEPATIC FUNCTION PANEL
ALT: 43 U/L (ref 0–53)
AST: 24 U/L (ref 0–37)
Albumin: 4.2 g/dL (ref 3.5–5.2)
Alkaline Phosphatase: 56 U/L (ref 39–117)
Bilirubin, Direct: 0.1 mg/dL (ref 0.0–0.3)
Total Bilirubin: 0.7 mg/dL (ref 0.2–1.2)
Total Protein: 6.5 g/dL (ref 6.0–8.3)

## 2022-09-23 LAB — MAGNESIUM: Magnesium: 2.2 mg/dL (ref 1.5–2.5)

## 2022-09-23 NOTE — Patient Instructions (Signed)

## 2022-09-23 NOTE — Progress Notes (Unsigned)
Patient ID: FATE CASTER, male   DOB: May 28, 1941, 82 y.o.   MRN: [id number]        Chief Complaint: follow up a flutter afib, dm, hld, htn, low vit d       HPI:  Troy Banks is a 82 y.o. male here with recent hospn apr 11 - 14 2024 overall doing well.  Pt denies chest pain, increased sob or doe, wheezing, orthopnea, PND, increased LE swelling, palpitations, dizziness or syncope.   Pt denies polydipsia, polyuria, or new focal neuro s/s.    Pt denies fever, wt loss, night sweats, loss of appetite, or other constitutional symptoms  Has f/u appt with cardiology apr 22.  Needs lab f/u today, and also later TSH and A1c.  Pt tolerating lovenox though abd is sore with prolonged use.         Transitional Care Management elements noted today: 1)  Date of D/C: as above 2)  Medication reconciliation:  done today at end visit 3)  Review of D/C summary or other information:  done today 4)  Review of need for f/u on pending diagnostic tests and treatments:  done today 5)  Review of need for Interaction with other providers who will assume or resume care of pt specific problems: done today - cardiology 6)  Education of patient/family/guardian or caregiver: done today - wife present Wt Readings from Last 3 Encounters:  09/23/22 210 lb (95.3 kg)  09/15/22 211 lb 10.3 oz (96 kg)  09/01/22 210 lb (95.3 kg)   BP Readings from Last 3 Encounters:  09/23/22 134/78  09/18/22 (!) 164/64  06/28/22 (!) 152/82         Past Medical History:  Diagnosis Date   Allergy    Anxiety    PTSD   Aortic stenosis, moderate 03/27/2015   post AVR echo Echo 11/16: Mild LVH, EF 55-60%, normal wall motion, grade 1 diastolic dysfunction, bioprosthetic AVR okay (mean gradient 18 mmHg) with trivial AI, mild LAE, atrial septal aneurysm, small effusion    Arthritis    "qwhere" (10/29/2015)   Cancer    skin cancer   Carotid artery stenosis    Carotid US 11/17: 1-39% bilateral ICA stenosis. F/u prn   Cervical stenosis  of spinal canal    fusion 2006   CHF (congestive heart failure)    "secondry to OHS"   Closed left pilon fracture    COLONIC POLYPS, HX OF    Complication of anesthesia    "takes a lot to put him under and has woken up during surgery" Difficult to wake up . PTSD; "does not metabolize RX well"that he gets "violent", per pt.    Coronary artery disease    DIVERTICULOSIS, COLON    Dysrhythmia    2 bouts of afib post op and at home but corrected with medication.   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 10/24/14 labs   PTSD (post-traumatic stress disorder)    PTSD (post-traumatic stress disorder)    Sleep apnea    On a CPAP   Past Surgical History:  Procedure Laterality Date   ANKLE FUSION Left 03/17/2017   Procedure: FUSION  PILON FRACTURE WITH BONE GRAFTING;  Surgeon: Roby Lofts, MD;  Location: [facility] OR;  Service: Orthopedics;  Laterality: Left;   ANTERIOR FUSION CERVICAL SPINE  2002   AORTIC VALVE REPLACEMENT N/A 03/30/2015   Procedure: AORTIC VALVE REPLACEMENT (AVR);  Surgeon: Delight Ovens, MD;  Location[facility] OR;  Service: Open Heart Surgery;  Laterality: N/A;   CARDIAC CATHETERIZATION N/A 03/26/2015   Procedure: Left Heart Cath and Coronary Angiography;  Surgeon: Marykay Lex, MD;  Location: [facility] INVASIVE CV LAB;  Service: Cardiovascular;  Laterality: N/A;   CARDIAC VALVE REPLACEMENT     COLONOSCOPY W/  POLYPECTOMY     CORONARY ARTERY BYPASS GRAFT N/A 03/30/2015   Procedure: CORONARY ARTERY BYPASS GRAFTING (CABG) x four, using left internal mammary artery and left    leg greater saphenous vein harvested endoscopically ;  Surgeon: Delight Ovens, MD;  Location: [facility] OR;  Service: Open Heart Surgery;  Laterality: N/A;   CORONARY/GRAFT ANGIOGRAPHY N/A 12/03/2021   Procedure: CORONARY/GRAFT ANGIOGRAPHY;  Surgeon: Swaziland, [name], MD;  Location: [facility] INVASIVE CV LAB;  Service: Cardiovascular;  Laterality: N/A;   CYSTOSCOPY/URETEROSCOPY/HOLMIUM LASER/STENT PLACEMENT Bilateral 11/07/2016   Procedure: CYSTOSCOPY/URETEROSCOPY/HOLMIUM LASER/STENT PLACEMENT;  Surgeon: Hildred Laser, MD;  Location: [facility] ORS;  Service: Urology;  Laterality: Bilateral;   CYSTOSCOPY/URETEROSCOPY/HOLMIUM LASER/STENT PLACEMENT Bilateral 11/23/2016   Procedure: CYSTOSCOPY/BILATERAL URETEROSCOPY/HOLMIUM LASER/STENT EXCHANGE;  Surgeon: Hildred Laser, MD;  Location: [facility] ORS;  Service: Urology;  Laterality: Bilateral;  NEEDS 90 MIN    EXTERNAL FIXATION LEG Left 02/22/2017   Procedure: EXTERNAL FIXATION LEFT ANKLE;  Surgeon: Sheral Apley, MD;  Location: [facility] OR;  Service: Orthopedics;  Laterality: Left;   HEMORRHOID BANDING  1970s   INGUINAL HERNIA REPAIR Left 1962   KIDNEY STONE SURGERY  1986   "cut me open"   LEFT HEART CATH AND CORS/GRAFTS ANGIOGRAPHY N/A 10/25/2018   Procedure: LEFT HEART CATH AND CORS/GRAFTS ANGIOGRAPHY;  Surgeon: Marykay Lex, MD;  Location: [facility] INVASIVE CV LAB;  Service: Cardiovascular;  Laterality: N/A;   LEFT HEART CATH AND CORS/GRAFTS ANGIOGRAPHY N/A 12/03/2021   Procedure: LEFT HEART CATH AND CORS/GRAFTS ANGIOGRAPHY;  Surgeon: Swaziland, [name], MD;  Location: [facility] INVASIVE CV LAB;  Service: Cardiovascular;  Laterality: N/A;   POSTERIOR FUSION CERVICAL SPINE  2006   TEE WITHOUT CARDIOVERSION N/A 03/30/2015   Procedure: TRANSESOPHAGEAL ECHOCARDIOGRAM (TEE);  Surgeon: Delight Ovens, MD;  Location: [facility]  OR;  Service: Open Heart Surgery;  Laterality: N/A;   TESTICLE TORSION REDUCTION Right 1960   TOE FUSION Left 1985 & 2004   "great toe" and removal   TONSILLECTOMY     TOTAL HIP ARTHROPLASTY Right 10/27/2015   Procedure: RIGHT TOTAL HIP ARTHROPLASTY ANTERIOR APPROACH and steroid injection right foot;  Surgeon: Kathryne Hitch, MD;  Location: [facility] OR;  Service: Orthopedics;  Laterality: Right;   TOTAL HIP ARTHROPLASTY Left 08/13/2019   TOTAL HIP ARTHROPLASTY Left 08/13/2019   Procedure: LEFT TOTAL HIP ARTHROPLASTY ANTERIOR APPROACH;  Surgeon: Kathryne Hitch, MD;  Location: [facility] OR;  Service: Orthopedics;  Laterality: Left;    reports  that he has been smoking cigarettes. He has a 34.00 pack-year smoking history. He has never used smokeless tobacco. He reports current alcohol use of about 3.0 standard drinks of alcohol per week. He reports that he does not use drugs. family history includes Alcohol abuse in an other family member; Arthritis in an other family member; Diabetes in his mother and paternal grandfather; Drug abuse in his sister; Healthy in his brother, daughter, and son; Heart disease in his father, maternal grandfather, maternal grandmother, mother, and paternal grandfather; Hypertension in his father, maternal grandfather, maternal grandmother, mother, and paternal grandfather; Prostate cancer in his maternal uncle; Tremor in his sister. Allergies  Allergen Reactions   Acyclovir And Related Other (See Comments)    Accumulates and causes stroke-like side-effects due to cytochrome P450 enzyme deficiency   Benzodiazepines Other (See Comments)    Patient states he gets stroke symptoms with benzo's.    Compazine [Prochlorperazine Maleate] Other (See Comments)    Accumulates and causes stroke-like side-effects due to cytochrome P450 enzyme deficiency   Coreg [Carvedilol] Other (See Comments)    Accumulates and causes stroke-like side-effects due to cytochrome P450 enzyme  deficiency Patient poor metabolizer of CYP2D6 - Coreg undergoes extensive hepatic (including 2D6)   Flomax [Tamsulosin Hcl] Other (See Comments)    Accumulates and causes stroke-like side-effects due to cytochrome P450 enzyme deficiency   Lisinopril Other (See Comments)    Hyperkalemia. 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   Mobic [Meloxicam] Other (See Comments)    Accumulates and causes stroke-like side-effects due to cytochrome P450 enzyme deficiency   Morphine And Related Other (See Comments)    Per pt's wife morphine also contraindicated due to p450 enzyme deficiency.     Ondansetron Other (See Comments)    Accumulates and causes stroke-like side-effects due to cytochrome P450 enzyme deficiency    Oxycodone Other (See Comments)    Accumulates and causes stroke-like side-effects due to cytochrome P450 enzyme deficiency   Promethazine Other (See Comments)    Accumulates and causes stroke-like side-effects due to cytochrome P450 enzyme deficiency   Sertraline Hcl Other (See Comments)    Accumulates and causes stroke-like side-effects due to cytochrome P450 enzyme deficiency   Tramadol Other (See Comments)    Accumulates and causes stroke-like side-effects due to cytochrome P450 enzyme deficiency   Atorvastatin Other (See Comments)    MYALGIAS   Colchicine Other (See Comments)    ataxia   Fentanyl Other (See Comments)    Accumulates and cause stroke like symptoms   Current Outpatient Medications on File Prior to Visit  Medication Sig Dispense Refill   allopurinol (ZYLOPRIM) 300 MG tablet TAKE 1 TABLET BY MOUTH EVERY DAY (Patient taking differently: Take 300 mg by mouth daily.) 90 tablet 3   Ascorbic Acid (VITAMIN C) 1000 MG tablet Take 1,000 mg by mouth daily.     aspirin EC 81 MG tablet Take 81 mg by mouth every other day. Swallow whole.     Carboxymethylcellulose Sodium (LUBRICANT EYE DROPS OP) Place 1 drop into both eyes 2  (two) times daily as needed (dry/irritated eyes.).      Cholecalciferol (VITAMIN D-3) 25 MCG (1000 UT) CAPS Take 1,000 Units by mouth every other day.     diltiazem (CARDIZEM CD) 240 MG 24 hr capsule Take 1 capsule (240 mg total) by mouth daily. 30 capsule 1   enoxaparin (LOVENOX) 100 MG/ML injection Inject 1.5 mLs (150 mg total) into the skin daily for 20  days. 30 mL 0   famotidine (PEPCID) 40 MG tablet Take 1 tablet (40 mg total) by mouth daily. 90 tablet 3   furosemide (LASIX) 40 MG tablet TAKE 1 TABLET BY MOUTH EVERY OTHER DAY (Patient taking differently: Take 40 mg by mouth every other day.) 45 tablet 2   isosorbide mononitrate (IMDUR) 30 MG 24 hr tablet Take 1 tablet (30 mg total) by mouth daily. 30 tablet 11   levothyroxine (SYNTHROID) 25 MCG tablet TAKE 1 TABLET BY MOUTH EVERY DAY WITH BREAKFAST (Patient taking differently: Take 25 mcg by mouth daily before breakfast.) 90 tablet 3   nitroGLYCERIN (NITROSTAT) 0.4 MG SL tablet PLACE 1 TABLET UNDER THE TONGUE EVERY 5 MINUTES AS NEEDED FOR CHEST PAIN. (Patient taking differently: Place 0.4 mg under the tongue every 5 (five) minutes as needed for chest pain.) 25 tablet 3   rosuvastatin (CRESTOR) 10 MG tablet TAKE 1/2 TABLET BY MOUTH EVERY DAY 45 tablet 1   tiZANidine (ZANAFLEX) 4 MG tablet Take 2 mg by mouth every 8 (eight) hours as needed for muscle spasms.     Turmeric 500 MG CAPS Take 500 mg by mouth daily.      vitamin B-12 (CYANOCOBALAMIN) 1000 MCG tablet Take 1,000 mcg by mouth every other day.     diltiazem (CARTIA XT) 240 MG 24 hr capsule Take 1 capsule (240 mg total) by mouth daily for 4 days. 4 capsule 0   enoxaparin (LOVENOX) 150 MG/ML injection Inject 1 mL (150 mg total) into the skin daily for 2 days. 2 mL 0   [DISCONTINUED] ranitidine (ZANTAC) 150 MG tablet TAKE 1 TABLET BY MOUTH 2 TIMES DAILY (Patient taking differently: No sig reported) 180 tablet 0   No current facility-administered medications on file prior to visit.         ROS:  All others reviewed and negative.  Objective        PE:  BP 134/78 (BP Location: Right Arm, Patient Position: Sitting, Cuff Size: Normal)   Pulse (!) 55   Temp 98 F (36.7 C) (Oral)   Ht  (1.727 m)   Wt 210 lb (95.3 kg)   SpO2 96%   BMI 31.93 kg/m                 Constitutional: Pt appears in NAD               HENT: Head: NCAT.                Right Ear: External ear normal.                 Left Ear: External ear normal.                Eyes: . Pupils are equal, round, and reactive to light. Conjunctivae and EOM are normal               Nose: without d/c or deformity               Neck: Neck supple. Gross normal ROM               Cardiovascular: Normal rate and regular rhythm.  With gr 3/6 sys murmur LUSB               Pulmonary/Chest: Effort normal and breath sounds without rales or wheezing.                Abd:  Soft, NT, ND, + BS, no  organomegaly               Neurological: Pt is alert. At baseline orientation, motor grossly intact               Skin: Skin is warm. No rashes, no other new lesions, LE edema - none               Psychiatric: Pt behavior is normal without agitation   Micro: none  Cardiac tracings I have personally interpreted today:  none  Pertinent Radiological findings (summarize): none   Lab Results  Component Value Date   WBC 6.4 09/23/2022   HGB 15.1 09/23/2022   HCT 44.5 09/23/2022   PLT 173.0 09/23/2022   GLUCOSE 203 (H) 09/23/2022   CHOL 143 06/28/2022   TRIG 276.0 (H) 06/28/2022   HDL 42.00 06/28/2022   LDLDIRECT 77.0 06/28/2022   LDLCALC 48 12/03/2021   ALT 43 09/23/2022   AST 24 09/23/2022   NA 140 09/23/2022   K 4.5 09/23/2022   CL 105 09/23/2022   CREATININE 1.08 09/23/2022   BUN 19 09/23/2022   CO2 26 09/23/2022   TSH 4.44 06/28/2022   PSA 1.95 06/28/2022   INR 1.1 09/15/2022   HGBA1C 6.9 (H) 06/28/2022   MICROALBUR 14.7 (H) 06/28/2022   Assessment/Plan:  Troy Banks is a 82 y.o. White or Caucasian [1] male  with  has a past medical history of Allergy, Anxiety, Aortic stenosis, moderate (03/27/2015), Arthritis, Cancer, Carotid artery stenosis, Cervical stenosis of spinal canal, CHF (congestive heart failure), Closed left pilon fracture, COLONIC POLYPS, HX OF, Complication of anesthesia, Coronary artery disease, DIVERTICULOSIS, COLON, Dysrhythmia, Familial tremor (03/19/2014), Family history of adverse reaction to anesthesia, GERD (gastroesophageal reflux disease), GOUT, Headache, Heart murmur, High cholesterol, History of blood transfusion, History of kidney stones, HYPERTENSION, Hypothyroidism, Myocardial infarction, OSA on CPAP, Pneumonia, 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  Atrial flutter No SR by exam, cont dilt 40 mg, lovenox, f/u cardiology as planned  Diet-controlled diabetes mellitus (HCC) Lab Results  Component Value Date   HGBA1C 6.9 (H) 06/28/2022   Uncontrolled mild, for f/u a1c, pt to continue current medical treatment - diet, wt control   Dyslipidemia Lab Results  Component Value Date   LDLCALC 48 12/03/2021   Stable, pt to continue current crestor 10 mg qd   Essential hypertension BP Readings from Last 3 Encounters:  09/23/22 134/78  09/18/22 (!) 164/64  06/28/22 (!) 152/82   Stable, pt to continue medical treatment cartia xt 240 qd   Vitamin D deficiency Last vitamin D Lab Results  Component Value Date   VD25OH 29.15 (L) 06/28/2022   Low, to start oral replacement  Followup: Return in about 3 months (around 12/27/2022).  Oliver Barre, MD 09/24/2022 9:25 PM [facility] Medicine

## 2022-09-23 NOTE — Progress Notes (Signed)
The test results show that your current treatment is OK, as the tests are stable.  Please continue the same plan.  There is no other need for change of treatment or further evaluation based on these results, at this time.  thanks 

## 2022-09-24 ENCOUNTER — Encounter: Payer: Self-pay | Admitting: Internal Medicine

## 2022-09-24 NOTE — Assessment & Plan Note (Signed)
BP Readings from Last 3 Encounters:  09/23/22 134/78  09/18/22 (!) 164/64  06/28/22 (!) 152/82   Stable, pt to continue medical treatment cartia xt 240 qd

## 2022-09-24 NOTE — Assessment & Plan Note (Signed)
Last vitamin D Lab Results  Component Value Date   VD25OH 29.15 (L) 06/28/2022   Low, to start oral replacement  

## 2022-09-24 NOTE — Assessment & Plan Note (Signed)
Lab Results  Component Value Date   LDLCALC 48 12/03/2021   Stable, pt to continue current crestor 10 mg qd

## 2022-09-24 NOTE — Assessment & Plan Note (Signed)
Lab Results  Component Value Date   HGBA1C 6.9 (H) 06/28/2022   Uncontrolled mild, for f/u a1c, pt to continue current medical treatment - diet, wt control

## 2022-09-24 NOTE — Assessment & Plan Note (Signed)
No SR by exam, cont dilt 40 mg, lovenox, f/u cardiology as planned

## 2022-09-26 ENCOUNTER — Ambulatory Visit: Payer: Medicare Other | Admitting: Podiatry

## 2022-09-26 ENCOUNTER — Encounter: Payer: Self-pay | Admitting: Podiatry

## 2022-09-26 DIAGNOSIS — M79675 Pain in left toe(s): Secondary | ICD-10-CM | POA: Diagnosis not present

## 2022-09-26 DIAGNOSIS — M79674 Pain in right toe(s): Secondary | ICD-10-CM | POA: Diagnosis not present

## 2022-09-26 DIAGNOSIS — B351 Tinea unguium: Secondary | ICD-10-CM | POA: Diagnosis not present

## 2022-09-26 DIAGNOSIS — E119 Type 2 diabetes mellitus without complications: Secondary | ICD-10-CM | POA: Diagnosis not present

## 2022-09-26 NOTE — Progress Notes (Unsigned)
Cardiology Clinic Note   Date: 09/27/2022 ID: Troy Banks, DOB 1941/04/18, MRN [id number]  Primary Cardiologist:  Peter Swaziland, MD  Patient Profile    Troy Banks is a 82 y.o. male who presents to the clinic today for hospital follow-up.  Past medical history significant for: CAD. LHC 03/26/2015 (NSTEMI): Ostial proximal LAD 45%.  Proximal to mid LAD 80%.  D1 #1 95%.  D1 #2 55%.  Ostial CX 75%.  Mid CX 90%.  OM3 60%.  Proximal RCA 60%.  Mid RCA #1 99%.  Mid RCA #2 60%.  Normal LV function.  Recommendation for CT surgery consult. CABG x 4 03/30/2015: LIMA to LAD.  Reverse SVG to intermediate coronary artery.  Reverse SVG to LCx.  Reverse SVG to distal RCA.  AVR with tissue valve. LHC 10/25/2018 (unstable angina): Severe native CAD essentially unchanged from post CABG with the exception of now 100% occluded RCA.  4/4 grafts patent.  Unable to cross aortic valve prosthesis. LHC 12/03/2021 (unstable angina): Three-vessel obstructive CAD.  4/4 grafts patent.  No significant change from 2020. Aortic valve stenosis. Echo 03/26/2015: EF 60 to 65%.  Moderate aortic valve stenosis, mean gradient 21 mmHg.  Mild BAE. AVR 03/30/2015: Tissue valve 23 mm. PAF/A-flutter. Onset October 2016 s/p CABG.  Converted to NSR on amiodarone. PAD. ABI 11/18/2021: Mild right lower extremity arterial disease.  Although ABI within normal limits on the left arterial waveforms at the ankle suggest some component of arterial occlusive disease. Chronic diastolic heart failure. Echo 09/16/2022: EF 60 to 65%.  Moderate LVH.  Grade I DD.  Normal aortic valve prosthesis, mean gradient 12 mmHg.  Dilated IVC, RA pressure 8 mmHg. Hypertension. Hyperlipidemia. Lipid panel 06/28/2022: LDL 77, HDL 42, TG 276, total 143. Hypothyroidism. Diet controlled T2DM. Migraines. OSA. Essential tremor. 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   History of Present Illness    Troy Banks was first evaluated by Dr. Patty Sermons  on 12/31/2013 for sinus bradycardia at the request of Dr. Felicity Coyer.  At that time patient declined echo to evaluate known aortic valve disease, as he stated he would never undergo any type of heart surgery.  Beta-blocker was decreased at that time.  He was not seen again until hospitalization for NSTEMI with hospital course as outlined above.  He continues to be followed by Dr. Swaziland for the above outlined history.  Patient was last seen in the office by Dr. Swaziland on 05/06/2022.  At that time he was doing well and no medication changes were made.  More recently, patient presented to the ED via EMS on 09/15/2022 for 1 month history of gradually worsening chest pain and A-fib with RVR.  EKG showed A-fib, rate 137 bpm.  Troponin slightly elevated but flat.  Patient hypokalemic with potassium 2.8.  Patient declined Cardizem secondary to concerns of interactions with CYP450 enzyme.  Pharmacy was consulted and felt Cardizem was best option for patient.  Patient was taking primidone for essential tremor. It was decided during hospitalization to stop primidone. Patient was started on Lovenox during primidone washout period and then will transition to DOAC. Patient was discharged on 09/18/2022.  Today, patient is accompanied by his wife.  Patient reports continued chest pain since hospitalization.  Pain starts on right side of chest and over approximately 2 hours radiates to center where it remains for 45 minutes before resolving on its own or with as needed SL NTG and then returns 1 to 2 hours later.  This can happen up to  5 times in 1 day and occurs most days of the week and is similar to anginal pain.  Patient denies lower extremity edema, orthopnea, PND.  Daily weight is stable.  He is hypertensive today with BP at intake 152/80 and 156/60 at recheck.  Wife reports he does have whitecoat syndrome.  Home BP typically 140-150/80.  Patient has chronic daily migraines for which she sees neurology.  Tremors are manageable  at this time since coming off primidone.  He denies any bleeding concerns with Lovenox.  He is tolerating Cardizem.    ROS: All other systems reviewed and are otherwise negative except as noted in History of Present Illness.  Studies Reviewed    ECG personally reviewed by me today: Sinus rhythm with first-degree AV block, 66 bpm.  No significant changes from 09/16/2022.  Risk Assessment/Calculations     CHA2DS2-VASc Score = 5   This indicates a 7.2% annual risk of stroke. The patient's score is based upon: CHF History: 0 HTN History: 1 Diabetes History: 1 Stroke History: 0 Vascular Disease History: 1 Age Score: 2 Gender Score: 0    HYPERTENSION CONTROL Vitals:   09/27/22 1359 09/27/22 1759  BP: (!) 152/80 (!) 156/60    The patient's blood pressure is elevated above target today.  In order to address the patient's elevated BP: A current anti-hypertensive medication was adjusted today.           Physical Exam    VS:  BP (!) 156/60 (BP Location: Left Arm, Patient Position: Sitting, Cuff Size: Normal)   Pulse 66   Ht  (1.727 m)   Wt 209 lb 6.4 oz (95 kg)   SpO2 96%   BMI 31.84 kg/m  , BMI Body mass index is 31.84 kg/m.  GEN: Well nourished, well developed, in no acute distress. Neck: No JVD or carotid bruits. Cardiac:  RRR. 2/6 systolic murmur. No rubs or gallops.   Respiratory:  Respirations regular and unlabored. Clear to auscultation without rales, wheezing or rhonchi. GI: Soft, nontender, nondistended. Extremities: Radials/DP/PT 2+ and equal bilaterally. No clubbing or cyanosis. No edema.  Skin: Warm and dry, no rash. Neuro: Strength intact.  Assessment & Plan   PAF.  Onset s/p CABG October 2016.  No further episodes of A-fib until hospitalization 09/15/2022 when he was found to be in A-fib with RVR.  Treatment for patient complicated by CYP450 enzyme deficiency and use of primidone for her tremors.  He was discharged on p.o. Cardizem and Lovenox with  plan to transition to DOAC after primidone washout.  No bleeding concerns on Lovenox.  Patient denies palpitations.  EKG today shows sinus rhythm with first-degree AV block, 66 bpm.  Regular rate and rhythm on exam today.  Will start Eliquis 5 mg 24 hours after last dose of Lovenox (around 10/09/2022).  Patient is scheduled to speak with Dr. Lalla Brothers regarding watchman placement so he can return to taking primidone for tremor. CAD/angina.  S/p CABG x 10 March 2015.  LHC June 2023 showed three-vessel obstructive CAD with 4/4 grafts patent.  Patient reports a several month history of chest pain that begins on the right side of his chest and slowly progresses to the center where it remains for 45 minutes before resolving on its own or with as needed SL NTG to then return once 2 hours later.  These episodes can happen up to 5 times in 1 day and occur most days of the week.  Will increase Imdur to 60 mg daily.  Patient is instructed to contact the office if symptoms do not resolve and we can consider ischemic evaluation at that time.  Patient and wife are both in agreement to hold off on ischemic eval secondary to upcoming appointments to discuss Watchman placement.  Continue aspirin, Crestor, as needed SL NTG. Aortic valve stenosis s/p tissue valve AVR October 2016.  Echo April 2024 showed normal aortic valve prosthesis.  Patient denies shortness of breath, DOE, lower extremity edema.  Daily weight is stable.  2/6 systolic murmur auscultated today.  Repeat echo as clinically indicated. Hypertension.  BP today 152/80 at intake, 156/60 at recheck.  Home BP typically 140-150/80.  Patient denies dizziness.  He has chronic daily migraines.  Increase isosorbide (see #2).  Continue diltiazem. Hyperlipidemia.  LDL January 2024 77.  Continue Crestor.    Disposition: Increase Imdur to 60 mg daily.  Patient to start Eliquis 5 mg twice daily 24 hours after last dose of Lovenox (around 5/5).  Return in 6 months or sooner as  needed.         Signed, Etta Grandchild. Dantonio Justen, DNP, NP-C

## 2022-09-26 NOTE — Progress Notes (Signed)
This patient returns to the office for evaluation and treatment of long thick painful nails .  This patient is unable to trim his own nails since the patient cannot reach the feet.  Patient says the nails are painful walking and wearing his shoes.  He returns for preventive foot care services.    General Appearance  Alert, conversant and in no acute stress.  Vascular  Dorsalis pedis and posterior tibial  pulses are palpable  bilaterally.  Capillary return is within normal limits  bilaterally. Temperature is within normal limits  bilaterally.  Neurologic  Senn-Weinstein monofilament wire test within normal limits  bilaterally. Muscle power within normal limits bilaterally.  Nails Thick disfigured discolored nails with subungual debris  from hallux to fifth toes bilaterally. No evidence of bacterial infection or drainage bilaterally.  Orthopedic  No limitations of motion  feet .  No crepitus or effusions noted.  No bony pathology or digital deformities noted.  Ankle/STJ immobile left foot/ankle.  Fused 1st MPJ right foot.  Skin  normotropic skin with no porokeratosis noted bilaterally.  No signs of infections or ulcers noted.     Onychomycosis  Pain in toes right foot  Pain in toes left foot  Debridement  of nails  1-5  B/L with a nail nipper.  Nails were then filed using a dremel tool with no incidents.    RTC  3 month   Corsica Franson DPM 

## 2022-09-27 ENCOUNTER — Ambulatory Visit: Payer: Medicare Other | Attending: Student | Admitting: Student

## 2022-09-27 ENCOUNTER — Encounter: Payer: Self-pay | Admitting: Student

## 2022-09-27 VITALS — BP 156/60 | HR 66 | Ht 68.0 in | Wt 209.4 lb

## 2022-09-27 DIAGNOSIS — I10 Essential (primary) hypertension: Secondary | ICD-10-CM | POA: Diagnosis not present

## 2022-09-27 DIAGNOSIS — I25118 Atherosclerotic heart disease of native coronary artery with other forms of angina pectoris: Secondary | ICD-10-CM

## 2022-09-27 DIAGNOSIS — I350 Nonrheumatic aortic (valve) stenosis: Secondary | ICD-10-CM

## 2022-09-27 DIAGNOSIS — Z952 Presence of prosthetic heart valve: Secondary | ICD-10-CM

## 2022-09-27 DIAGNOSIS — Z951 Presence of aortocoronary bypass graft: Secondary | ICD-10-CM

## 2022-09-27 DIAGNOSIS — I480 Paroxysmal atrial fibrillation: Secondary | ICD-10-CM

## 2022-09-27 DIAGNOSIS — E785 Hyperlipidemia, unspecified: Secondary | ICD-10-CM | POA: Diagnosis not present

## 2022-09-27 MED ORDER — APIXABAN 5 MG PO TABS: 5.0000 mg | ORAL_TABLET | Freq: Two times a day (BID) | ORAL | 3 refills | Status: DC

## 2022-09-27 MED ORDER — ISOSORBIDE MONONITRATE ER 60 MG PO TB24: 60.0000 mg | ORAL_TABLET | Freq: Every day | ORAL | 3 refills | Status: DC

## 2022-09-27 NOTE — Patient Instructions (Signed)
Medication Instructions:  Your physician has recommended you make the following change in your medication:  INCREASE: Imdur  daily  START: Eliquis  twice daily. (But please be sure to start this medication 24 hours after your last dosage of Lovenox around May 5th.)  *If you need a refill on your cardiac medications before your next appointment, please call your pharmacy*   Lab Work: NONE If you have labs (blood work) drawn today and your tests are completely normal, you will receive your results only by: MyChart Message (if you have MyChart) OR A paper copy in the mail If you have any lab test that is abnormal or we need to change your treatment, we will call you to review the results.   Testing/Procedures: NONE   Follow-Up: At [facility], you and your health needs are our priority.  As part of our continuing mission to provide you with exceptional heart care, we have created designated Provider Care Teams.  These Care Teams include your primary Cardiologist (physician) and Advanced Practice Providers (APPs -  Physician Assistants and Nurse Practitioners) who all work together to provide you with the care you need, when you need it.  We recommend signing up for the patient portal called "MyChart".  Sign up information is provided on this After Visit Summary.  MyChart is used to connect with patients for Virtual Visits (Telemedicine).  Patients are able to view lab/test results, encounter notes, upcoming appointments, etc.  Non-urgent messages can be sent to your provider as well.   To learn more about what you can do with MyChart, go to [email or web address].    Your next appointment:   6 month(s)  Provider:   Peter Swaziland, MD

## 2022-09-29 ENCOUNTER — Encounter: Payer: Self-pay | Admitting: Physical Medicine and Rehabilitation

## 2022-09-30 DIAGNOSIS — D3131 Benign neoplasm of right choroid: Secondary | ICD-10-CM | POA: Diagnosis not present

## 2022-09-30 DIAGNOSIS — H353131 Nonexudative age-related macular degeneration, bilateral, early dry stage: Secondary | ICD-10-CM | POA: Diagnosis not present

## 2022-09-30 DIAGNOSIS — H25813 Combined forms of age-related cataract, bilateral: Secondary | ICD-10-CM | POA: Diagnosis not present

## 2022-09-30 DIAGNOSIS — H43813 Vitreous degeneration, bilateral: Secondary | ICD-10-CM | POA: Diagnosis not present

## 2022-10-05 ENCOUNTER — Encounter: Payer: Self-pay | Admitting: Emergency Medicine

## 2022-10-05 ENCOUNTER — Ambulatory Visit (INDEPENDENT_AMBULATORY_CARE_PROVIDER_SITE_OTHER): Payer: Medicare Other | Admitting: Emergency Medicine

## 2022-10-05 VITALS — BP 132/74 | HR 76 | Temp 98.1°F | Ht 68.0 in | Wt 206.0 lb

## 2022-10-05 DIAGNOSIS — K4090 Unilateral inguinal hernia, without obstruction or gangrene, not specified as recurrent: Secondary | ICD-10-CM | POA: Insufficient documentation

## 2022-10-05 NOTE — Assessment & Plan Note (Signed)
Active and affecting quality of life Stable.  Benign physical exam. No signs of strangulation or incarceration Daily intermittent pain Pain management discussed.  Unable to take NSAIDs or Tylenol Advised to increase amount of fiber in his diet and stay well-hydrated Needs surgical evaluation Surgical referral placed today

## 2022-10-05 NOTE — Patient Instructions (Signed)
Inguinal Hernia, Adult An inguinal hernia develops when fat or the intestines push through a weak spot in a muscle where the leg meets the lower abdomen (groin). This creates a bulge. This kind of hernia could also be: In the scrotum, if you are male. In folds of skin around the vagina, if you are male. There are three types of inguinal hernias: Hernias that can be pushed back into the abdomen (are reducible). This type rarely causes pain. Hernias that are not reducible (are incarcerated). Hernias that are not reducible and lose their blood supply (are strangulated). This type of hernia requires emergency surgery. What are the causes? This condition is caused by having a weak spot in the muscles or tissues in your groin. This develops over time. The hernia may poke through the weak spot when you suddenly strain your lower abdominal muscles, such as when you: Lift a heavy object. Strain to have a bowel movement. Constipation can lead to straining. Cough. What increases the risk? This condition is more likely to develop in: Males. Pregnant females. People who: Are overweight. Work in jobs that require long periods of standing or heavy lifting. Have had an inguinal hernia before. Smoke or have lung disease. These factors can lead to long-term (chronic) coughing. What are the signs or symptoms? Symptoms may depend on the size of the hernia. Often, a small inguinal hernia has no symptoms. Symptoms of a larger hernia may include: A bulge in the groin area. This is easier to see when standing. It might not be visible when lying down. Pain or burning in the groin. This may get worse when lifting, straining, or coughing. A dull ache or a feeling of pressure in the groin. An unusual bulge in the scrotum, in males. Symptoms of a strangulated inguinal hernia may include: A bulge in your groin that is very painful and tender to the touch. A bulge that turns red or purple. Fever, nausea, and  vomiting. Inability to have a bowel movement or to pass gas. How is this diagnosed? This condition is diagnosed based on your symptoms, your medical history, and a physical exam. Your health care provider may feel your groin area and ask you to cough. How is this treated? Treatment depends on the size of your hernia and whether you have symptoms. If you do not have symptoms, your health care provider may have you watch your hernia carefully and have you come in for follow-up visits. If your hernia is large or if you have symptoms, you may need surgery to repair the hernia. Follow these instructions at home: Lifestyle Avoid lifting heavy objects. Avoid standing for long periods of time. Do not use any products that contain nicotine or tobacco. These products include cigarettes, chewing tobacco, and vaping devices, such as e-cigarettes. If you need help quitting, ask your health care provider. Maintain a healthy weight. Preventing constipation You may need to take these actions to prevent or treat constipation: Drink enough fluid to keep your urine pale yellow. Take over-the-counter or prescription medicines. Eat foods that are high in fiber, such as beans, whole grains, and fresh fruits and vegetables. Limit foods that are high in fat and processed sugars, such as fried or sweet foods. General instructions You may try to push the hernia back in place by very gently pressing on it while lying down. Do not try to force the bulge back in if it will not push in easily. Watch your hernia for any changes in shape, size, or   color. Get help right away if you notice any changes. Take over-the-counter and prescription medicines only as told by your health care provider. Keep all follow-up visits. This is important. Contact a health care provider if: You have a fever or chills. You develop new symptoms. Your symptoms get worse. Get help right away if: You have pain in your groin that suddenly gets  worse. You have a bulge in your groin that: Suddenly gets bigger and does not get smaller. Becomes red or purple or painful to the touch. You are a man and you have a sudden pain in your scrotum, or the size of your scrotum suddenly changes. You cannot push the hernia back in place by very gently pressing on it when you are lying down. You have nausea or vomiting that does not go away. You have a fast heartbeat. You cannot have a bowel movement or pass gas. These symptoms may represent a serious problem that is an emergency. Do not wait to see if the symptoms will go away. Get medical help right away. Call your local emergency services (911 in the [place] Summary An inguinal hernia develops when fat or the intestines push through a weak spot in a muscle where your leg meets your lower abdomen (groin). This condition is caused by having a weak spot in muscles or tissues in your groin. Symptoms may depend on the size of the hernia, and they may include pain or swelling in your groin. A small inguinal hernia often has no symptoms. Treatment may not be needed if you do not have symptoms. If you have symptoms or a large hernia, you may need surgery to repair the hernia. Avoid lifting heavy objects. Also, avoid standing for long periods of time. This information is not intended to replace advice given to you by your health care provider. Make sure you discuss any questions you have with your health care provider. Document Revised: 01/21/2020 Document Reviewed: 01/21/2020 Elsevier Patient Education  2023 [employer]  

## 2022-10-05 NOTE — Progress Notes (Signed)
Troy Banks 82 y.o.   Chief Complaint  Patient presents with   Abdominal Pain    Patient states he was told he had a abd hernia that has been hurting.  Right side pain     HISTORY OF PRESENT ILLNESS: This is a 82 y.o. male with history of right inguinal hernia complaining of persistent daily pain for the last month and a half Still able to eat and drink.  Denies nausea or vomiting. Able to move bowels daily. Able to urinate okay. No other complaints or medical concerns today.  Abdominal Pain Pertinent negatives include no diarrhea, dysuria, fever, headaches, hematuria, melena, nausea or vomiting.     Prior to Admission medications   Medication Sig Start Date End Date Taking? Authorizing Provider  allopurinol (ZYLOPRIM) 300 MG tablet TAKE 1 TABLET BY MOUTH EVERY DAY Patient taking differently: Take 300 mg by mouth daily. 05/02/22  Yes Corwin Levins, MD  apixaban (ELIQUIS) 5 MG TABS tablet Take 1 tablet (5 mg total) by mouth 2 (two) times daily. 09/27/22  Yes Wittenborn, Gavin Pound, NP  Ascorbic Acid (VITAMIN C) 1000 MG tablet Take 1,000 mg by mouth daily.   Yes [provider]  aspirin EC 81 MG tablet Take 81 mg by mouth every other day. Swallow whole.   Yes [provider]  Carboxymethylcellulose Sodium (LUBRICANT EYE DROPS OP) Place 1 drop into both eyes 2 (two) times daily as needed (dry/irritated eyes.).    Yes [provider]  Cholecalciferol (VITAMIN D-3) 25 MCG (1000 UT) CAPS Take 1,000 Units by mouth every other day.   Yes [provider]  diltiazem (CARDIZEM CD) 240 MG 24 hr capsule Take 1 capsule (240 mg total) by mouth daily. 09/18/22  Yes Rhetta Mura, MD  famotidine (PEPCID) 40 MG tablet Take 1 tablet (40 mg total) by mouth daily. 06/28/22  Yes Corwin Levins, MD  furosemide (LASIX) 40 MG tablet TAKE 1 TABLET BY MOUTH EVERY OTHER DAY Patient taking differently: Take 40 mg by mouth every other day. 05/20/22  Yes [place], [name],  MD  isosorbide mononitrate (IMDUR) 60 MG 24 hr tablet Take 1 tablet (60 mg total) by mouth daily. 09/27/22  Yes Wittenborn, Gavin Pound, NP  levothyroxine (SYNTHROID) 25 MCG tablet TAKE 1 TABLET BY MOUTH EVERY DAY WITH BREAKFAST Patient taking differently: Take 25 mcg by mouth daily before breakfast. 06/29/21  Yes Corwin Levins, MD  nitroGLYCERIN (NITROSTAT) 0.4 MG SL tablet PLACE 1 TABLET UNDER THE TONGUE EVERY 5 MINUTES AS NEEDED FOR CHEST PAIN. Patient taking differently: Place 0.4 mg under the tongue every 5 (five) minutes as needed for chest pain. 11/30/21  Yes [place], [name], MD  rosuvastatin (CRESTOR) 10 MG tablet TAKE 1/2 TABLET BY MOUTH EVERY DAY 09/21/22  Yes Rollene Rotunda, MD  tiZANidine (ZANAFLEX) 4 MG tablet Take 2 mg by mouth every 8 (eight) hours as needed for muscle spasms. 12/18/17  Yes [provider]  Turmeric 500 MG CAPS Take 500 mg by mouth daily.    Yes [provider]  vitamin B-12 (CYANOCOBALAMIN) 1000 MCG tablet Take 1,000 mcg by mouth every other day.   Yes [provider]  diltiazem (CARTIA XT) 240 MG 24 hr capsule Take 1 capsule (240 mg total) by mouth daily for 4 days. 09/17/22 09/21/22  Rhetta Mura, MD  enoxaparin (LOVENOX) 100 MG/ML injection Inject 1.5 mLs (150 mg total) into the skin daily for 20 days. 09/17/22 10/07/22  Rhetta Mura, MD  enoxaparin (LOVENOX)  150 MG/ML injection Inject 1 mL (150 mg total) into the skin daily for 2 days. 09/17/22 09/19/22  Rhetta Mura, MD  ranitidine (ZANTAC) 150 MG tablet TAKE 1 TABLET BY MOUTH 2 TIMES DAILY Patient taking differently: No sig reported 09/05/18 12/13/18  Corwin Levins, MD    Allergies  Allergen Reactions   Acyclovir And Related Other (See Comments)    Accumulates and causes stroke-like side-effects due to cytochrome P450 enzyme deficiency   Benzodiazepines Other (See Comments)    Patient states he gets stroke symptoms with benzo's.    Compazine [Prochlorperazine Maleate] Other  (See Comments)    Accumulates and causes stroke-like side-effects due to cytochrome P450 enzyme deficiency   Coreg [Carvedilol] Other (See Comments)    Accumulates and causes stroke-like side-effects due to cytochrome P450 enzyme deficiency Patient poor metabolizer of CYP2D6 - Coreg undergoes extensive hepatic (including 2D6)   Flomax [Tamsulosin Hcl] Other (See Comments)    Accumulates and causes stroke-like side-effects due to cytochrome P450 enzyme deficiency   Lisinopril Other (See Comments)    Hyperkalemia. 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   Mobic [Meloxicam] Other (See Comments)    Accumulates and causes stroke-like side-effects due to cytochrome P450 enzyme deficiency   Morphine And Related Other (See Comments)    Per pt's wife morphine also contraindicated due to p450 enzyme deficiency.     Ondansetron Other (See Comments)    Accumulates and causes stroke-like side-effects due to cytochrome P450 enzyme deficiency    Oxycodone Other (See Comments)    Accumulates and causes stroke-like side-effects due to cytochrome P450 enzyme deficiency   Promethazine Other (See Comments)    Accumulates and causes stroke-like side-effects due to cytochrome P450 enzyme deficiency   Sertraline Hcl Other (See Comments)    Accumulates and causes stroke-like side-effects due to cytochrome P450 enzyme deficiency   Tramadol Other (See Comments)    Accumulates and causes stroke-like side-effects due to cytochrome P450 enzyme deficiency   Atorvastatin Other (See Comments)    MYALGIAS   Colchicine Other (See Comments)    ataxia   Fentanyl Other (See Comments)    Accumulates and cause stroke like symptoms    Patient Active Problem List   Diagnosis Date Noted   Age-related nuclear cataract, bilateral 09/23/2022   Cysts of right upper eyelid 09/23/2022   Other specified disorders of eyelid 09/23/2022   Exposure to potentially hazardous  substance 09/23/2022   Atrial flutter (HCC) 09/15/2022   Skin lesion 06/28/2022   Pain of left scapula 04/20/2022   Unstable angina (HCC) 12/02/2021   Intractable chronic migraine without aura and without status migrainosus 11/11/2021   Vitamin D deficiency 06/13/2021   Encounter for fitting and adjustment of hearing aid 11/26/2020   Encounter for fitting and adjustment of spectacles and contact lenses 11/26/2020   DNR (do not resuscitate) discussion 11/26/2020   Aortic atherosclerosis (HCC) 11/26/2020   Allergic rhinitis 10/05/2020   Cough 10/05/2020   Sensorineural hearing loss, bilateral 09/29/2020   Hallux rigidus 09/23/2020   Retinoschisis 09/23/2020   RUQ pain 06/10/2020   History of lacunar cerebrovascular accident 04/05/2020   Headache 03/16/2020   Ankle pain 01/22/2020   Hypothyroidism 11/26/2019   Epididymitis 08/28/2019   Unilateral primary osteoarthritis, left hip 08/13/2019   Status post total replacement of left hip 08/13/2019   Abdominal wall hernia 11/02/2018   Progressive angina (HCC) 10/25/2018   DOE (dyspnea on exertion) 10/25/2018   Abscess of right buttock 11/27/2017  Acute sinus infection 11/05/2017   Pilon fracture 03/17/2017   Pilon fracture of left tibia 03/17/2017   At risk for adverse drug reaction 02/28/2017   Closed fracture dislocation of ankle joint, left, with delayed healing, subsequent encounter 02/22/2017   Renal stone 10/26/2016   Gross hematuria 10/26/2016   Bladder neck obstruction 10/26/2016   External hemorrhoid, thrombosed 06/10/2016   Internal hemorrhoids 06/10/2016   Lumbar back pain 05/19/2016   Osteoarthritis of right hip 10/27/2015   Encounter for well adult exam with abnormal findings 10/27/2015   Chronic chest pain 09/22/2015   Acute gouty arthritis 06/11/2015   Coronary artery disease involving native coronary artery with angina pectoris (HCC) 06/11/2015   Chronic diastolic CHF (congestive heart failure) (HCC) 06/11/2015    H/O tissue AVR Oct 2016 06/10/2015   PAF post CABG/AVR- Amiodarone 06/10/2015   Dyslipidemia 06/10/2015   S/P CABG x 4-Oct 2016 03/30/2015   H/O NSTEMI-Oct 2016 03/27/2015   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)    Depression 09/08/2014   GERD (gastroesophageal reflux disease)    Essential tremor 03/31/2014   Musculoskeletal pain 01/01/2014   Spondylolisthesis at L4-L5 level 01/01/2014   Sinus bradycardia 12/31/2013   Greater trochanteric bursitis of right hip 12/04/2013   OSA on CPAP    Diet-controlled diabetes mellitus (HCC) 07/08/2009   COLONIC POLYPS, HX OF 12/31/2008   GOUT 12/22/2008   Essential hypertension 12/22/2008   Diverticulosis of colon 12/22/2008   NEPHROLITHIASIS, HX OF 12/22/2008    Past Medical History:  Diagnosis Date   Allergy    Anxiety    PTSD   Aortic stenosis, moderate 03/27/2015   post AVR echo Echo 11/16: Mild LVH, EF 55-60%, normal wall motion, grade 1 diastolic dysfunction, bioprosthetic AVR okay (mean gradient 18 mmHg) with trivial AI, mild LAE, atrial septal aneurysm, small effusion    Arthritis    "qwhere" (10/29/2015)   Cancer (HCC)    skin cancer   Carotid artery stenosis    Carotid US 11/17: 1-39% bilateral ICA stenosis. F/u prn   Cervical stenosis of spinal canal    fusion 2006   CHF (congestive heart failure) (HCC)    "secondry to [facility]"   Closed left pilon fracture    COLONIC POLYPS, HX OF    Complication of anesthesia    "takes a lot to put him under and has woken up during surgery" Difficult to wake up . PTSD; "does not metabolize RX well"that he gets "violent", per pt.    Coronary artery disease    DIVERTICULOSIS, COLON    Dysrhythmia    2 bouts of afib post op and at home but corrected with medication.   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)    confirmed heterozygous  10/24/14 labs   PTSD (post-traumatic stress disorder)    PTSD (post-traumatic stress disorder)    Sleep apnea    On a CPAP    Past Surgical History:  Procedure Laterality Date   ANKLE FUSION Left 03/17/2017   Procedure: FUSION  PILON FRACTURE WITH BONE GRAFTING;  Surgeon: Roby Lofts, MD;  Location: [facility] OR;  Service: Orthopedics;  Laterality: Left;   ANTERIOR FUSION CERVICAL SPINE  2002   AORTIC VALVE REPLACEMENT N/A 03/30/2015   Procedure: AORTIC VALVE REPLACEMENT (AVR);  Surgeon: Delight Ovens, MD;  Location: [facility] OR;  Service: Open Heart Surgery;  Laterality: N/A;   CARDIAC CATHETERIZATION N/A 03/26/2015   Procedure: Left Heart Cath and Coronary Angiography;  Surgeon: Marykay Lex, MD;  Location: [facility] INVASIVE CV LAB;  Service: Cardiovascular;  Laterality: N/A;   CARDIAC VALVE REPLACEMENT     COLONOSCOPY W/ POLYPECTOMY     CORONARY ARTERY BYPASS GRAFT N/A 03/30/2015   Procedure: CORONARY ARTERY BYPASS GRAFTING (CABG) x four, using left internal mammary artery and left    leg greater saphenous vein harvested endoscopically ;  Surgeon: Delight Ovens, MD;  Location: [facility] OR;  Service: Open Heart Surgery;  Laterality: N/A;   CORONARY/GRAFT ANGIOGRAPHY N/A 12/03/2021   Procedure: CORONARY/GRAFT ANGIOGRAPHY;  Surgeon: Swaziland, [name], MD;  Location: [facility] INVASIVE CV LAB;  Service: Cardiovascular;  Laterality: N/A;   CYSTOSCOPY/URETEROSCOPY/HOLMIUM LASER/STENT PLACEMENT Bilateral 11/07/2016   Procedure: CYSTOSCOPY/URETEROSCOPY/HOLMIUM LASER/STENT PLACEMENT;  Surgeon:  Hildred Laser, MD;  Location: [facility] ORS;  Service: Urology;  Laterality: Bilateral;   CYSTOSCOPY/URETEROSCOPY/HOLMIUM LASER/STENT PLACEMENT Bilateral 11/23/2016   Procedure: CYSTOSCOPY/BILATERAL URETEROSCOPY/HOLMIUM LASER/STENT EXCHANGE;  Surgeon: Hildred Laser, MD;  Location: [facility] ORS;  Service: Urology;  Laterality: Bilateral;  NEEDS 90 MIN    EXTERNAL FIXATION LEG Left 02/22/2017   Procedure: EXTERNAL FIXATION LEFT ANKLE;  Surgeon: Sheral Apley, MD;  Location: [facility] OR;  Service: Orthopedics;  Laterality: Left;   HEMORRHOID BANDING  1970s   INGUINAL HERNIA REPAIR Left 1962   KIDNEY STONE SURGERY  1986   "cut me open"   LEFT HEART CATH AND CORS/GRAFTS ANGIOGRAPHY N/A 10/25/2018   Procedure: LEFT HEART CATH AND CORS/GRAFTS ANGIOGRAPHY;  Surgeon: Marykay Lex, MD;  Location: [facility] INVASIVE CV LAB;  Service: Cardiovascular;  Laterality: N/A;   LEFT HEART CATH AND CORS/GRAFTS ANGIOGRAPHY N/A 12/03/2021   Procedure: LEFT HEART CATH AND CORS/GRAFTS ANGIOGRAPHY;  Surgeon: Swaziland, [name], MD;  Location: [facility] INVASIVE CV LAB;  Service: Cardiovascular;  Laterality: N/A;   POSTERIOR FUSION CERVICAL SPINE  2006   TEE WITHOUT CARDIOVERSION N/A 03/30/2015   Procedure: TRANSESOPHAGEAL ECHOCARDIOGRAM (TEE);  Surgeon: Delight Ovens, MD;  Location: [facility] OR;  Service: Open Heart Surgery;  Laterality: N/A;   TESTICLE TORSION REDUCTION Right 1960   TOE FUSION Left 1985 & 2004   "great toe" and removal   TONSILLECTOMY     TOTAL HIP ARTHROPLASTY Right 10/27/2015   Procedure: RIGHT TOTAL HIP ARTHROPLASTY ANTERIOR APPROACH and steroid injection right foot;  Surgeon: Kathryne Hitch, MD;  Location: [facility] OR;  Service: Orthopedics;  Laterality: Right;   TOTAL HIP ARTHROPLASTY Left 08/13/2019   TOTAL HIP ARTHROPLASTY Left 08/13/2019   Procedure: LEFT TOTAL HIP ARTHROPLASTY ANTERIOR APPROACH;  Surgeon: Kathryne Hitch, MD;  Location: [facility] OR;  Service: Orthopedics;  Laterality: Left;    Social History    Socioeconomic History   Marital status: Married    Spouse name: Danne Baxter, RN   Number of children:  2   Years of education: Not on file   Highest education level: Some college, no degree  Occupational History   Occupation: Retired Economist  Tobacco Use   Smoking status: Some Days    Packs/day: 1.00    Years: 34.00    Additional pack years: 0.00    Total pack years: 34.00    Types: Cigarettes    Last attempt to quit: 06/06/1990    Years since quitting: 32.3   Smokeless tobacco: Never  Vaping Use   Vaping Use: Never used  Substance and Sexual Activity   Alcohol use: Yes    Alcohol/week: 3.0 standard drinks of alcohol    Types: 1 Cans of beer, 2 Shots of liquor per week    Comment: occassional   Drug use: No   Sexual activity: Yes  Other Topics Concern   Not on file  Social History Narrative   Left Handed    One Story home    Social Determinants of Health   Financial Resource Strain: Low Risk  (09/01/2022)   Overall Financial Resource Strain (CARDIA)    Difficulty of Paying Living Expenses: Not hard at all  Food Insecurity: No Food Insecurity (09/20/2022)   Hunger Vital Sign    Worried About Running Out of Food in the Last Year: Never true    Ran Out of Food in the Last Year: Never true  Transportation Needs: No Transportation Needs (09/20/2022)   PRAPARE - Administrator, Civil Service (Medical): No    Lack of Transportation (Non-Medical): No  Physical Activity: Inactive (09/01/2022)   Exercise Vital Sign    Days of Exercise per Week: 0 days    Minutes of Exercise per Session: 0 min  Stress: Stress Concern Present (09/01/2022)   [employer] of Occupational Health - Occupational Stress Questionnaire    Feeling of Stress : To some extent  Social Connections: Unknown (09/01/2022)   Social Connection and Isolation Panel [NHANES]    Frequency of Communication with Friends and Family: More than three times a week    Frequency of Social  Gatherings with Friends and Family: Twice a week    Attends Religious Services: Not on Marketing executive or Organizations: Yes    Attends Banker Meetings: More than 4 times per year    Marital Status: Married  Catering manager Violence: Not At Risk (09/15/2022)   Humiliation, Afraid, Rape, and Kick questionnaire    Fear of Current or Ex-Partner: No    Emotionally Abused: No    Physically Abused: No    Sexually Abused: No    Family History  Problem Relation Age of Onset   Heart disease Mother    Hypertension Mother    Diabetes Mother    Heart disease Father    Hypertension Father    Tremor Sister    Healthy Brother    Healthy Son    Prostate cancer Maternal Uncle    Hypertension Maternal Grandmother    Heart disease Maternal Grandmother    Hypertension Maternal Grandfather    Heart disease Maternal Grandfather    Hypertension Paternal Grandfather    Heart disease Paternal Grandfather    Diabetes Paternal Grandfather    Alcohol abuse Other    Arthritis Other    Drug abuse Sister    Healthy Daughter    Colon polyps Neg Hx    Esophageal cancer Neg Hx    Rectal cancer Neg Hx  Stomach cancer Neg Hx    Colon cancer Neg Hx      Review of Systems  Constitutional: Negative.  Negative for chills and fever.  HENT: Negative.  Negative for congestion and sore throat.   Respiratory: Negative.  Negative for cough and shortness of breath.   Cardiovascular: Negative.  Negative for chest pain and palpitations.  Gastrointestinal:  Positive for abdominal pain. Negative for blood in stool, diarrhea, melena, nausea and vomiting.  Genitourinary: Negative.  Negative for dysuria and hematuria.  Skin: Negative.  Negative for rash.  Neurological: Negative.  Negative for dizziness and headaches.  All other systems reviewed and are negative.   Vitals:   10/05/22 1003  BP: 132/74  Pulse: 76  Temp: 98.1 F (36.7 C)  SpO2: 95%    Physical Exam Vitals  reviewed.  Constitutional:      Appearance: He is well-developed.  HENT:     Head: Normocephalic.  Eyes:     Pupils: Pupils are equal, round, and reactive to light.  Cardiovascular:     Rate and Rhythm: Normal rate.  Pulmonary:     Effort: Pulmonary effort is normal.  Abdominal:     Palpations: Abdomen is soft.     Tenderness: There is no abdominal tenderness.     Hernia: A hernia (Tender right inguinal hernia) is present.  Neurological:     General: No focal deficit present.     Mental Status: He is alert and oriented to person, place, and time.  Psychiatric:        Mood and Affect: Mood normal.        Behavior: Behavior normal.      ASSESSMENT & PLAN: A total of 33 minutes was spent with the patient and counseling/coordination of care regarding preparing for this visit, review of most recent office visit notes, review of chronic medical conditions under management, review of all medications, diagnosis of right inguinal hernia and need for surgical evaluation, pain management, ED precautions, prognosis, documentation, and need for follow-up.  Problem List Items Addressed This Visit       Other   Right inguinal hernia - Primary    Active and affecting quality of life Stable.  Benign physical exam. No signs of strangulation or incarceration Daily intermittent pain Pain management discussed.  Unable to take NSAIDs or Tylenol Advised to increase amount of fiber in his diet and stay well-hydrated Needs surgical evaluation Surgical referral placed today      Relevant Orders   Ambulatory referral to General Surgery   Patient Instructions  Inguinal Hernia, Adult An inguinal hernia develops when fat or the intestines push through a weak spot in a muscle where the leg meets the lower abdomen (groin). This creates a bulge. This kind of hernia could also be: In the scrotum, if you are male. In folds of skin around the vagina, if you are male. There are three types of  inguinal hernias: Hernias that can be pushed back into the abdomen (are reducible). This type rarely causes pain. Hernias that are not reducible (are incarcerated). Hernias that are not reducible and lose their blood supply (are strangulated). This type of hernia requires emergency surgery. What are the causes? This condition is caused by having a weak spot in the muscles or tissues in your groin. This develops over time. The hernia may poke through the weak spot when you suddenly strain your lower abdominal muscles, such as when you: Lift a heavy object. Strain to have a bowel movement.  Constipation can lead to straining. Cough. What increases the risk? This condition is more likely to develop in: Males. Pregnant females. People who: Are overweight. Work in jobs that require long periods of standing or heavy lifting. Have had an inguinal hernia before. Smoke or have lung disease. These factors can lead to long-term (chronic) coughing. What are the signs or symptoms? Symptoms may depend on the size of the hernia. Often, a small inguinal hernia has no symptoms. Symptoms of a larger hernia may include: A bulge in the groin area. This is easier to see when standing. It might not be visible when lying down. Pain or burning in the groin. This may get worse when lifting, straining, or coughing. A dull ache or a feeling of pressure in the groin. An unusual bulge in the scrotum, in males. Symptoms of a strangulated inguinal hernia may include: A bulge in your groin that is very painful and tender to the touch. A bulge that turns red or purple. Fever, nausea, and vomiting. Inability to have a bowel movement or to pass gas. How is this diagnosed? This condition is diagnosed based on your symptoms, your medical history, and a physical exam. Your health care provider may feel your groin area and ask you to cough. How is this treated? Treatment depends on the size of your hernia and whether you  have symptoms. If you do not have symptoms, your health care provider may have you watch your hernia carefully and have you come in for follow-up visits. If your hernia is large or if you have symptoms, you may need surgery to repair the hernia. Follow these instructions at home: Lifestyle Avoid lifting heavy objects. Avoid standing for long periods of time. Do not use any products that contain nicotine or tobacco. These products include cigarettes, chewing tobacco, and vaping devices, such as e-cigarettes. If you need help quitting, ask your health care provider. Maintain a healthy weight. Preventing constipation You may need to take these actions to prevent or treat constipation: Drink enough fluid to keep your urine pale yellow. Take over-the-counter or prescription medicines. Eat foods that are high in fiber, such as beans, whole grains, and fresh fruits and vegetables. Limit foods that are high in fat and processed sugars, such as fried or sweet foods. General instructions You may try to push the hernia back in place by very gently pressing on it while lying down. Do not try to force the bulge back in if it will not push in easily. Watch your hernia for any changes in shape, size, or color. Get help right away if you notice any changes. Take over-the-counter and prescription medicines only as told by your health care provider. Keep all follow-up visits. This is important. Contact a health care provider if: You have a fever or chills. You develop new symptoms. Your symptoms get worse. Get help right away if: You have pain in your groin that suddenly gets worse. You have a bulge in your groin that: Suddenly gets bigger and does not get smaller. Becomes red or purple or painful to the touch. You are a man and you have a sudden pain in your scrotum, or the size of your scrotum suddenly changes. You cannot push the hernia back in place by very gently pressing on it when you are lying  down. You have nausea or vomiting that does not go away. You have a fast heartbeat. You cannot have a bowel movement or pass gas. These symptoms may represent a serious  problem that is an emergency. Do not wait to see if the symptoms will go away. Get medical help right away. Call your local emergency services (911 in the [place] Summary An inguinal hernia develops when fat or the intestines push through a weak spot in a muscle where your leg meets your lower abdomen (groin). This condition is caused by having a weak spot in muscles or tissues in your groin. Symptoms may depend on the size of the hernia, and they may include pain or swelling in your groin. A small inguinal hernia often has no symptoms. Treatment may not be needed if you do not have symptoms. If you have symptoms or a large hernia, you may need surgery to repair the hernia. Avoid lifting heavy objects. Also, avoid standing for long periods of time. This information is not intended to replace advice given to you by your health care provider. Make sure you discuss any questions you have with your health care provider. Document Revised: 01/21/2020 Document Reviewed: 01/21/2020 Elsevier Patient Education  2023 Elsevier Inc.    Edwina Barth, MD [facility] at [facility]

## 2022-10-13 ENCOUNTER — Emergency Department (HOSPITAL_COMMUNITY)
Admission: EM | Admit: 2022-10-13 | Discharge: 2022-10-13 | Disposition: A | Discharge status: Home / Self Care | Payer: Medicare Other | Attending: Emergency Medicine | Admitting: Emergency Medicine

## 2022-10-13 ENCOUNTER — Emergency Department (HOSPITAL_COMMUNITY): Payer: Medicare Other

## 2022-10-13 DIAGNOSIS — R932 Abnormal findings on diagnostic imaging of liver and biliary tract: Secondary | ICD-10-CM | POA: Diagnosis not present

## 2022-10-13 DIAGNOSIS — I110 Hypertensive heart disease with heart failure: Secondary | ICD-10-CM | POA: Diagnosis not present

## 2022-10-13 DIAGNOSIS — I509 Heart failure, unspecified: Secondary | ICD-10-CM | POA: Diagnosis not present

## 2022-10-13 DIAGNOSIS — Z7982 Long term (current) use of aspirin: Secondary | ICD-10-CM | POA: Diagnosis not present

## 2022-10-13 DIAGNOSIS — R1011 Right upper quadrant pain: Secondary | ICD-10-CM | POA: Insufficient documentation

## 2022-10-13 DIAGNOSIS — Z7901 Long term (current) use of anticoagulants: Secondary | ICD-10-CM | POA: Diagnosis not present

## 2022-10-13 DIAGNOSIS — R109 Unspecified abdominal pain: Secondary | ICD-10-CM | POA: Diagnosis present

## 2022-10-13 DIAGNOSIS — R7401 Elevation of levels of liver transaminase levels: Secondary | ICD-10-CM | POA: Diagnosis not present

## 2022-10-13 DIAGNOSIS — N200 Calculus of kidney: Secondary | ICD-10-CM | POA: Diagnosis not present

## 2022-10-13 DIAGNOSIS — K5730 Diverticulosis of large intestine without perforation or abscess without bleeding: Secondary | ICD-10-CM | POA: Diagnosis not present

## 2022-10-13 DIAGNOSIS — Z79899 Other long term (current) drug therapy: Secondary | ICD-10-CM | POA: Insufficient documentation

## 2022-10-13 DIAGNOSIS — R1031 Right lower quadrant pain: Secondary | ICD-10-CM | POA: Diagnosis not present

## 2022-10-13 DIAGNOSIS — K8020 Calculus of gallbladder without cholecystitis without obstruction: Secondary | ICD-10-CM

## 2022-10-13 LAB — URINALYSIS, ROUTINE W REFLEX MICROSCOPIC
Bacteria, UA: NONE SEEN
Bilirubin Urine: NEGATIVE
Glucose, UA: NEGATIVE mg/dL
Ketones, ur: 5 mg/dL — AB
Leukocytes,Ua: NEGATIVE
Nitrite: NEGATIVE
Protein, ur: 100 mg/dL — AB
RBC / HPF: >50 RBC/hpf (ref 0–5)
Specific Gravity, Urine: 1.017 (ref 1.005–1.030)
pH: 5 (ref 5.0–8.0)

## 2022-10-13 LAB — COMPREHENSIVE METABOLIC PANEL
ALT: 66 U/L — ABNORMAL HIGH (ref 0–44)
AST: 42 U/L — ABNORMAL HIGH (ref 15–41)
Albumin: 3.6 g/dL (ref 3.5–5.0)
Alkaline Phosphatase: 62 U/L (ref 38–126)
Anion gap: 12 (ref 5–15)
BUN: 16 mg/dL (ref 8–23)
CO2: 19 mmol/L — ABNORMAL LOW (ref 22–32)
Calcium: 8.9 mg/dL (ref 8.9–10.3)
Chloride: 103 mmol/L (ref 98–111)
Creatinine, Ser: 1.28 mg/dL — ABNORMAL HIGH (ref 0.61–1.24)
GFR, Estimated: 57 mL/min — ABNORMAL LOW (ref >60)
Glucose, Bld: 205 mg/dL — ABNORMAL HIGH (ref 70–99)
Potassium: 4.4 mmol/L (ref 3.5–5.1)
Sodium: 134 mmol/L — ABNORMAL LOW (ref 135–145)
Total Bilirubin: 2.1 mg/dL — ABNORMAL HIGH (ref 0.3–1.2)
Total Protein: 6.5 g/dL (ref 6.5–8.1)

## 2022-10-13 LAB — CBC
HCT: 47.5 % (ref 39.0–52.0)
Hemoglobin: 16 g/dL (ref 13.0–17.0)
MCH: 30.9 pg (ref 26.0–34.0)
MCHC: 33.7 g/dL (ref 30.0–36.0)
MCV: 91.9 fL (ref 80.0–100.0)
Platelets: 102 10*3/uL — ABNORMAL LOW (ref 150–400)
RBC: 5.17 MIL/uL (ref 4.22–5.81)
RDW: 13.5 % (ref 11.5–15.5)
WBC: 4.1 10*3/uL (ref 4.0–10.5)
nRBC: 0 % (ref 0.0–0.2)

## 2022-10-13 LAB — LIPASE, BLOOD: Lipase: 25 U/L (ref 11–51)

## 2022-10-13 MED ORDER — HYDROMORPHONE HCL 1 MG/ML IJ SOLN
1.0000 mg | Freq: Once | INTRAMUSCULAR | Status: AC
  Administered 2022-10-13: 1 mg via INTRAVENOUS
  Filled 2022-10-13: qty 1

## 2022-10-13 NOTE — ED Provider Triage Note (Signed)
Emergency Medicine Provider Triage Evaluation Note  Troy Banks , a 82 y.o. male  was evaluated in triage.  Pt complains of right sided abdominal pain for few days. Was diagnosed with R inguinal hernia, has surgical consult with Dr Alexandria Lodge on 5/14, but pain has gotten significantly worse with nausea. Wife is a Engineer, civil (consulting) and is concerned he won't make it to his appt due to pain.  Review of Systems  Positive: Abd pain, nausea, scrotal swelling Negative: Diarrhea, vomiting  Physical Exam  BP (!) 168/86 (BP Location: Right Arm)   Pulse 95   Temp 98.9 F (37.2 C)   Resp 18   SpO2 98%  Gen:   Awake, no distress   Resp:  Normal effort  MSK:   Moves extremities without difficulty  Other:  RUQ tender to palpation  Medical Decision Making  Medically screening exam initiated at 1:29 PM.  Appropriate orders placed.  Troy Banks was informed that the remainder of the evaluation will be completed by another provider, this initial triage assessment does not replace that evaluation, and the importance of remaining in the ED until their evaluation is complete.  Workup initiated including CT imaging   Troy Banks T, [name] 10/13/22 1331

## 2022-10-13 NOTE — ED Triage Notes (Signed)
Pt c/o RUQ abdominal pain with nausea worsening in recent days. Pt was diagnosed with R inguinal hernia and has surgical referral later in the month.

## 2022-10-13 NOTE — Discharge Instructions (Signed)
You were seen in the emergency room today with right-sided abdominal pain.  Your CT scan did not show any inguinal hernias but instead shows stones in your gallbladder.  I would like for you to reach out to the general surgery team to see if he can move up your appointment with them to consider your pain symptoms and decide if the gallbladder is indeed the source and would need to be removed.

## 2022-10-13 NOTE — ED Provider Notes (Signed)
EMERGENCY DEPARTMENT AT [facility] Provider Note   CSN: [id number] Arrival date & time: 10/13/22  1259     History  Chief Complaint  Patient presents with   Abdominal Pain    Troy Banks is a 82 y.o. male.  Troy Banks is a 82 y.o. male with a history of hypertension, hyperlipidemia, CHF, MI, GERD, and recent diagnosis of right inguinal hernia, who presents to the ED for worsening right-sided abdominal pain over the past 3 days.  Patient reports associated nausea and decreased appetite but no vomiting.  He had a bowel movement yesterday but has not had a bowel movement today and has not passed gas.  He denies any fevers or chills.  He reports that his abdomen has felt a bit more bloated recently.  He has upcoming follow-up with Dr. Michaell Cowing with general surgery regarding inguinal hernia but pain worsening over the past 3 days has concerned him.  The history is provided by the patient and the spouse.  Abdominal Pain Associated symptoms: nausea   Associated symptoms: no chills, no dysuria, no fever and no vomiting        Home Medications Prior to Admission medications   Medication Sig Start Date End Date Taking? Authorizing Provider  allopurinol (ZYLOPRIM) 300 MG tablet TAKE 1 TABLET BY MOUTH EVERY DAY Patient taking differently: Take 300 mg by mouth daily. 05/02/22   Corwin Levins, MD  apixaban (ELIQUIS) 5 MG TABS tablet Take 1 tablet (5 mg total) by mouth 2 (two) times daily. 09/27/22   Carlos Levering, NP  Ascorbic Acid (VITAMIN C) 1000 MG tablet Take 1,000 mg by mouth daily.    [provider]  aspirin EC 81 MG tablet Take 81 mg by mouth every other day. Swallow whole.    [provider]  Carboxymethylcellulose Sodium (LUBRICANT EYE DROPS OP) Place 1 drop into both eyes 2 (two) times daily as needed (dry/irritated eyes.).     [provider]  Cholecalciferol (VITAMIN D-3) 25 MCG (1000 UT) CAPS Take 1,000 Units by  mouth every other day.    [provider]  diltiazem (CARDIZEM CD) 240 MG 24 hr capsule Take 1 capsule (240 mg total) by mouth daily. 09/18/22   Rhetta Mura, MD  diltiazem (CARTIA XT) 240 MG 24 hr capsule Take 1 capsule (240 mg total) by mouth daily for 4 days. 09/17/22 09/21/22  Rhetta Mura, MD  enoxaparin (LOVENOX) 100 MG/ML injection Inject 1.5 mLs (150 mg total) into the skin daily for 20 days. 09/17/22 10/07/22  Rhetta Mura, MD  enoxaparin (LOVENOX) 150 MG/ML injection Inject 1 mL (150 mg total) into the skin daily for 2 days. 09/17/22 09/19/22  Rhetta Mura, MD  famotidine (PEPCID) 40 MG tablet Take 1 tablet (40 mg total) by mouth daily. 06/28/22   Corwin Levins, MD  furosemide (LASIX) 40 MG tablet TAKE 1 TABLET BY MOUTH EVERY OTHER DAY Patient taking differently: Take 40 mg by mouth every other day. 05/20/22   [place], [name], MD  isosorbide mononitrate (IMDUR) 60 MG 24 hr tablet Take 1 tablet (60 mg total) by mouth daily. 09/27/22   Carlos Levering, NP  levothyroxine (SYNTHROID) 25 MCG tablet TAKE 1 TABLET BY MOUTH EVERY DAY WITH BREAKFAST Patient taking differently: Take 25 mcg by mouth daily before breakfast. 06/29/21   Corwin Levins, MD  nitroGLYCERIN (NITROSTAT) 0.4 MG SL tablet PLACE 1 TABLET UNDER THE TONGUE EVERY 5 MINUTES AS NEEDED FOR CHEST PAIN. Patient  taking differently: Place 0.4 mg under the tongue every 5 (five) minutes as needed for chest pain. 11/30/21   [place], [name], MD  rosuvastatin (CRESTOR) 10 MG tablet TAKE 1/2 TABLET BY MOUTH EVERY DAY 09/21/22   Rollene Rotunda, MD  tiZANidine (ZANAFLEX) 4 MG tablet Take 2 mg by mouth every 8 (eight) hours as needed for muscle spasms. 12/18/17   [provider]  Turmeric 500 MG CAPS Take 500 mg by mouth daily.     [provider]  vitamin B-12 (CYANOCOBALAMIN) 1000 MCG tablet Take 1,000 mcg by mouth every other day.    [provider]  ranitidine (ZANTAC) 150 MG tablet  TAKE 1 TABLET BY MOUTH 2 TIMES DAILY Patient taking differently: No sig reported 09/05/18 12/13/18  Corwin Levins, MD      Allergies    Acyclovir and related, Benzodiazepines, Compazine [prochlorperazine maleate], Coreg [carvedilol], Flomax [tamsulosin hcl], Lisinopril, Mobic [meloxicam], Morphine and related, Ondansetron, Oxycodone, Promethazine, Sertraline hcl, Tramadol, Atorvastatin, Colchicine, and Fentanyl    Review of Systems   Review of Systems  Constitutional:  Negative for chills and fever.  HENT: Negative.    Gastrointestinal:  Positive for abdominal pain and nausea. Negative for vomiting.  Genitourinary:  Negative for dysuria, frequency, scrotal swelling and testicular pain.  Skin:  Negative for color change and rash.  All other systems reviewed and are negative.   Physical Exam Updated Vital Signs BP (!) 168/86 (BP Location: Right Arm)   Pulse 95   Temp 98.9 F (37.2 C)   Resp 18   SpO2 98%  Physical Exam Vitals and nursing note reviewed.  Constitutional:      General: He is not in acute distress.    Appearance: Normal appearance. He is well-developed. He is not ill-appearing or diaphoretic.  HENT:     Head: Normocephalic and atraumatic.  Eyes:     General:        Right eye: No discharge.        Left eye: No discharge.  Cardiovascular:     Rate and Rhythm: Normal rate and regular rhythm.     Heart sounds: Normal heart sounds.  Pulmonary:     Effort: Pulmonary effort is normal. No respiratory distress.     Breath sounds: Normal breath sounds.  Abdominal:     General: Abdomen is protuberant. Bowel sounds are increased.     Palpations: Abdomen is soft.     Tenderness: There is abdominal tenderness in the right upper quadrant and right lower quadrant.     Comments: Protuberant abdomen, soft with increased bowel sounds, tenderness primarily throughout the right side of the abdomen without guarding.  Some tenderness in the right inguinal region without visible hernia  or bulging.  Neurological:     Mental Status: He is alert and oriented to person, place, and time.     Coordination: Coordination normal.  Psychiatric:        Mood and Affect: Mood normal.        Behavior: Behavior normal.     ED Results / Procedures / Treatments   Labs (all labs ordered are listed, but only abnormal results are displayed) Labs Reviewed  COMPREHENSIVE METABOLIC PANEL - Abnormal; Notable for the following components:      Result Value   Sodium 134 (*)    CO2 19 (*)    Glucose, Bld 205 (*)    Creatinine, Ser 1.28 (*)    AST 42 (*)    ALT 66 (*)  Total Bilirubin 2.1 (*)    GFR, Estimated 57 (*)    All other components within normal limits  CBC - Abnormal; Notable for the following components:   Platelets 102 (*)    All other components within normal limits  LIPASE, BLOOD  URINALYSIS, ROUTINE W REFLEX MICROSCOPIC    EKG None  Radiology No results found.  Procedures Procedures    Medications Ordered in ED Medications - No data to display  ED Course/ Medical Decision Making/ A&P                             Medical Decision Making  82 y.o. male presents to the ED with complaints of right-sided abdominal pain, this involves an extensive number of treatment options, and is a complaint that carries with it a high risk of complications and morbidity.  The differential diagnosis includes incarcerated or strangulated hernia, bowel obstruction, appendicitis, cholecystitis, diverticulitis, colitis, perforation, nephrolithiasis  On arrival pt is nontoxic, vitals significant for mildly elevated blood pressure which I suspect is in the setting of pain, vitals otherwise stable.   Additional history obtained from wife at bedside. 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  Lab Tests:  I Ordered, reviewed, and interpreted labs, which included: No leukocytosis and normal hemoglobin, creatinine minimally increased from baseline, currently 1.28 previously 1.05, glucose 205, no other significant electrolyte derangements, mildly elevated LFTs and T. bili of 2.1.  UA with hematuria present as well as some calcium oxalate crystals which raises suspicion for possible nephrolithiasis.  Imaging Studies ordered:  I ordered imaging studies which included CT renal stone study.  CT pending at shift change   ED Course:   Care signed out to Dr. Jacqulyn Bath pending CT results, he will follow up on study and disposition appropriately    Portions of this note were generated with Dragon dictation software. Dictation errors may occur despite best attempts at proofreading.         Final Clinical Impression(s) / ED Diagnoses Final diagnoses:  Right sided abdominal pain    Rx / DC Orders ED Discharge Orders     None         Legrand Rams 10/13/22 1851    Maia Plan, MD 10/13/22 2117

## 2022-10-13 NOTE — ED Notes (Signed)
Patient to ultrasound

## 2022-10-14 ENCOUNTER — Telehealth: Payer: Self-pay | Admitting: *Deleted

## 2022-10-14 DIAGNOSIS — K811 Chronic cholecystitis: Secondary | ICD-10-CM | POA: Diagnosis not present

## 2022-10-14 DIAGNOSIS — E8809 Other disorders of plasma-protein metabolism, not elsewhere classified: Secondary | ICD-10-CM | POA: Insufficient documentation

## 2022-10-14 NOTE — Telephone Encounter (Signed)
   Pre-operative Risk Assessment    Patient Name: Troy Banks  DOB: 1941-03-05 MRN: [id number]      Request for Surgical Clearance    Procedure:   CHOLECYSTECTOMY  Date of Surgery:  Clearance TBD                                 Surgeon:  Luretha Murphy, MD Surgeon's Group or Practice Name:  [facility] Phone number:  [phone] Fax number:  [phone]   Type of Clearance Requested:   - Medical  - Pharmacy:  Hold Aspirin and Apixaban (Eliquis) NOT INDICATED HOW LONG   Type of Anesthesia:  General    Additional requests/questions:    Wilhemina Cash   10/14/2022, 2:57 PM

## 2022-10-17 NOTE — Telephone Encounter (Signed)
Patient with diagnosis of aflutter on Eliquis for anticoagulation.    Procedure: cholecystectomy Date of procedure: 10/28/22  CHA2DS2-VASc Score = 8  This indicates a 10.8% annual risk of stroke. 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  CrCl 82mL/min Platelet count 102K  Per office protocol, patient can hold Eliquis for 1-2 days prior to procedure. Resume as soon as safely possible given elevated CV risk.  **This guidance is not considered finalized until pre-operative APP has relayed final recommendations.**

## 2022-10-17 NOTE — Progress Notes (Signed)
Sent message, via epic in basket, requesting orders in epic from surgeon.  

## 2022-10-18 ENCOUNTER — Telehealth: Payer: Self-pay

## 2022-10-18 NOTE — Telephone Encounter (Signed)
   Name: Troy Banks  DOB: [birth year]  MRN: [id number]  Primary Cardiologist: Peter [place], MD  Chart reviewed as part of pre-operative protocol coverage. Because of Jevaughn Jeanphilippe Raffo's past medical history and time since last visit, he will require a follow-up telephone visit in order to better assess preoperative cardiovascular risk.  Pre-op covering staff: - Please schedule appointment and call patient to inform them. If patient already had an upcoming appointment within acceptable timeframe, please add "pre-op clearance" to the appointment notes so provider is aware. - Please contact requesting surgeon's office via preferred method (i.e, phone, fax) to inform them of need for appointment prior to surgery.  Per office protocol, patient can hold Eliquis for 1-2 days prior to procedure. Resume as soon as safely possible given elevated CV risk.    Sharlene Dory, [name]  10/18/2022, 8:24 AM

## 2022-10-18 NOTE — Telephone Encounter (Signed)
  Patient Consent for Virtual Visit        Troy Banks has provided verbal consent on 10/18/2022 for a virtual visit (video or telephone).   CONSENT FOR VIRTUAL VISIT FOR:  Troy Banks  By participating in this virtual visit I agree to the following:  I hereby voluntarily request, consent and authorize [facility] and its employed or contracted physicians, physician assistants, nurse practitioners or other licensed health care professionals (the Practitioner), to provide me with telemedicine health care services (the "Services") as deemed necessary by the treating Practitioner. I acknowledge and consent to receive the Services by the Practitioner via telemedicine. I understand that the telemedicine visit will involve communicating with the Practitioner through live audiovisual communication technology and the disclosure of certain medical information by electronic transmission. I acknowledge that I have been given the opportunity to request an in-person assessment or other available alternative prior to the telemedicine visit and am voluntarily participating in the telemedicine visit.  I understand that I have the right to withhold or withdraw my consent to the use of telemedicine in the course of my care at any time, without affecting my right to future care or treatment, and that the Practitioner or I may terminate the telemedicine visit at any time. I understand that I have the right to inspect all information obtained and/or recorded in the course of the telemedicine visit and may receive copies of available information for a reasonable fee.  I understand that some of the potential risks of receiving the Services via telemedicine include:  Delay or interruption in medical evaluation due to technological equipment failure or disruption; Information transmitted may not be sufficient (e.g. poor resolution of images) to allow for appropriate medical decision making by the  Practitioner; and/or  In rare instances, security protocols could fail, causing a breach of personal health information.  Furthermore, I acknowledge that it is my responsibility to provide information about my medical history, conditions and care that is complete and accurate to the best of my ability. I acknowledge that Practitioner's advice, recommendations, and/or decision may be based on factors not within their control, such as incomplete or inaccurate data provided by me or distortions of diagnostic images or specimens that may result from electronic transmissions. I understand that the practice of medicine is not an exact science and that Practitioner makes no warranties or guarantees regarding treatment outcomes. I acknowledge that a copy of this consent can be made available to me via my patient portal [facility] MyChart), or I can request a printed copy by calling the office of [facility].    I understand that my insurance will be billed for this visit.   I have read or had this consent read to me. I understand the contents of this consent, which adequately explains the benefits and risks of the Services being provided via telemedicine.  I have been provided ample opportunity to ask questions regarding this consent and the Services and have had my questions answered to my satisfaction. I give my informed consent for the services to be provided through the use of telemedicine in my medical care

## 2022-10-18 NOTE — Telephone Encounter (Signed)
Patient's wife returned my call to schedule a tele visit. Patient's wife was agreeable (DPR on file) for patient to do the visit on 5/20 at 2:20. Patient's wife informed me that patient's procedure is on 5/24. Med rec and consent have been done.

## 2022-10-18 NOTE — Telephone Encounter (Signed)
I left a message for the patient to call our the schedule a tele visit for pre-op.

## 2022-10-19 ENCOUNTER — Encounter: Payer: Self-pay | Admitting: Physical Medicine and Rehabilitation

## 2022-10-19 ENCOUNTER — Encounter
Payer: Medicare Other | Attending: Physical Medicine and Rehabilitation | Admitting: Physical Medicine and Rehabilitation

## 2022-10-19 VITALS — BP 134/67 | HR 61 | Ht 68.0 in | Wt 206.8 lb

## 2022-10-19 DIAGNOSIS — E8889 Other specified metabolic disorders: Secondary | ICD-10-CM | POA: Diagnosis not present

## 2022-10-19 DIAGNOSIS — R519 Headache, unspecified: Secondary | ICD-10-CM | POA: Diagnosis not present

## 2022-10-19 DIAGNOSIS — G8929 Other chronic pain: Secondary | ICD-10-CM | POA: Insufficient documentation

## 2022-10-19 DIAGNOSIS — M542 Cervicalgia: Secondary | ICD-10-CM | POA: Diagnosis not present

## 2022-10-19 NOTE — Patient Instructions (Signed)
  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

## 2022-10-19 NOTE — Progress Notes (Signed)
Subjective:    Patient ID: Troy Banks, male    DOB: 1940/06/17, 82 y.o.   MRN: [id number]  HPI Troy Banks is a 82 y.o. year old male  who  has a past medical history of Allergy, Anxiety, Aortic stenosis, moderate (03/27/2015), Arthritis, Cancer (HCC), Carotid artery stenosis, Cervical stenosis of spinal canal, CHF (congestive heart failure) (HCC), Closed left pilon fracture, COLONIC POLYPS, HX OF, Complication of anesthesia, Coronary artery disease, DIVERTICULOSIS, COLON, Dysrhythmia, Familial tremor (03/19/2014), Family history of adverse reaction to anesthesia, GERD (gastroesophageal reflux disease), GOUT, Headache, Heart murmur, High cholesterol, History of blood transfusion, History of kidney stones, HYPERTENSION, Hypothyroidism, Myocardial infarction (HCC), OSA on CPAP, Pneumonia, 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   They are presenting to PM&R clinic as a new patient for treatment of chronic headaches . They were referred by Dr. Lurena Joiner [name] .  Troy Banks, has neurology appointment 6/18.   Chief complaint:  R sided HA radiating to R eye. No aura.  Duration: Chronic, constant but varying severity over the course of several days Description: Stabbing on his right lateral-superior skull, occassionally radiating behind the right eye + vision blurriness in R eye with severe headaches Exacerbated by: bright lights, otherwise none Remitted by: "sitting in my recliner and using biofeedback", laying in a dark room  Medications tried: Has tried tylenol, ibuprofen, and motrin OTC; "they make it bearable". He tries not to take anything, and will take 1-2 excedrin or tylenol when it gets severe up to twice a day. Usually needs medication 4-5 days per week.   No prescription medications; has tried tizanidine but doesn't take much because it makes him "develop signs and  symptoms of a stroke" if he takes more than 2x weekly.   Other non-invasive interventions tried: Botox and IV infusions neither worked.   Injections: Has had R sided ESIs in the past  Relevant Surgeries: Has had cervical fusions 2x, most recently 2006, C5-6  Pain Inventory Average Pain 6 Pain Right Now 9 My pain is constant, sharp, dull, stabbing, and aching  In the last 24 hours, has pain interfered with the following? General activity 5 Relation with others 3 Enjoyment of life 5 What TIME of day is your pain at its worst? morning , daytime, evening, and night Sleep (in general) Fair  Pain is worse with: unsure Pain improves with: rest Relief from Meds: 2  walk without assistance ability to climb steps?  yes do you drive?  yes  retired  weakness numbness tremor trouble walking confusion anxiety  Any changes since last visit?  yes scheduled for lap cholecystectomy 10/28/22  Any changes since last visit?  no    Family History  Problem Relation Age of Onset   Heart disease Mother    Hypertension Mother    Diabetes Mother    Heart disease Father    Hypertension Father    Tremor Sister    Healthy Brother    Healthy Son    Prostate cancer Maternal Uncle    Hypertension Maternal Grandmother    Heart disease Maternal Grandmother    Hypertension Maternal Grandfather    Heart disease Maternal Grandfather    Hypertension Paternal Grandfather    Heart disease Paternal Grandfather    Diabetes Paternal Grandfather    Alcohol abuse Other    Arthritis Other    Drug abuse Sister    Healthy Daughter  Colon polyps Neg Hx    Esophageal cancer Neg Hx    Rectal cancer Neg Hx    Stomach cancer Neg Hx    Colon cancer Neg Hx    Social History   Socioeconomic History   Marital status: Married    Spouse name: Troy Baxter, RN   Number of children: 2   Years of education: Not on file   Highest education level: Some college, no degree  Occupational History    Occupation: Retired Economist  Tobacco Use   Smoking status: Some Days    Packs/day: 1.00    Years: 34.00    Additional pack years: 0.00    Total pack years: 34.00    Types: Cigarettes    Last attempt to quit: 06/06/1990    Years since quitting: 32.3   Smokeless tobacco: Never  Vaping Use   Vaping Use: Never used  Substance and Sexual Activity   Alcohol use: Yes    Alcohol/week: 3.0 standard drinks of alcohol    Types: 1 Cans of beer, 2 Shots of liquor per week    Comment: occassional   Drug use: No   Sexual activity: Yes  Other Topics Concern   Not on file  Social History Narrative   Left Handed    One Story home    Social Determinants of Health   Financial Resource Strain: Low Risk  (09/01/2022)   Overall Financial Resource Strain (CARDIA)    Difficulty of Paying Living Expenses: Not hard at all  Food Insecurity: No Food Insecurity (09/20/2022)   Hunger Vital Sign    Worried About Running Out of Food in the Last Year: Never true    Ran Out of Food in the Last Year: Never true  Transportation Needs: No Transportation Needs (09/20/2022)   PRAPARE - Administrator, Civil Service (Medical): No    Lack of Transportation (Non-Medical): No  Physical Activity: Inactive (09/01/2022)   Exercise Vital Sign    Days of Exercise per Week: 0 days    Minutes of Exercise per Session: 0 min  Stress: Stress Concern Present (09/01/2022)   [employer] of Occupational Health - Occupational Stress Questionnaire    Feeling of Stress : To some extent  Social Connections: Unknown (09/01/2022)   Social Connection and Isolation Panel [NHANES]    Frequency of Communication with Friends and Family: More than three times a week    Frequency of Social Gatherings with Friends and Family: Twice a week    Attends Religious Services: Not on Marketing executive or Organizations: Yes    Attends Banker Meetings: More than 4 times per year    Marital  Status: Married   Past Surgical History:  Procedure Laterality Date   ANKLE FUSION Left 03/17/2017   Procedure: FUSION  PILON FRACTURE WITH BONE GRAFTING;  Surgeon: Roby Lofts, MD;  Location: [facility] OR;  Service: Orthopedics;  Laterality: Left;   ANTERIOR FUSION CERVICAL SPINE  2002   AORTIC VALVE REPLACEMENT N/A 03/30/2015   Procedure: AORTIC VALVE REPLACEMENT (AVR);  Surgeon: Delight Ovens, MD;  Location: [facility] OR;  Service: Open Heart Surgery;  Laterality: N/A;   CARDIAC CATHETERIZATION N/A 03/26/2015   Procedure: Left Heart Cath and Coronary Angiography;  Surgeon: Marykay Lex, MD;  Location: [facility] INVASIVE CV LAB;  Service: Cardiovascular;  Laterality: N/A;   CARDIAC VALVE REPLACEMENT     COLONOSCOPY W/ POLYPECTOMY     CORONARY  ARTERY BYPASS GRAFT N/A 03/30/2015   Procedure: CORONARY ARTERY BYPASS GRAFTING (CABG) x four, using left internal mammary artery and left    leg greater saphenous vein harvested endoscopically ;  Surgeon: Delight Ovens, MD;  Location: [facility] OR;  Service: Open Heart Surgery;  Laterality: N/A;   CORONARY/GRAFT ANGIOGRAPHY N/A 12/03/2021   Procedure: CORONARY/GRAFT ANGIOGRAPHY;  Surgeon: Swaziland, [name], MD;  Location: [facility] INVASIVE CV LAB;  Service: Cardiovascular;  Laterality: N/A;   CYSTOSCOPY/URETEROSCOPY/HOLMIUM LASER/STENT PLACEMENT Bilateral 11/07/2016   Procedure: CYSTOSCOPY/URETEROSCOPY/HOLMIUM LASER/STENT PLACEMENT;  Surgeon: Hildred Laser, MD;  Location: [facility] ORS;  Service: Urology;  Laterality: Bilateral;   CYSTOSCOPY/URETEROSCOPY/HOLMIUM LASER/STENT PLACEMENT Bilateral 11/23/2016   Procedure: CYSTOSCOPY/BILATERAL URETEROSCOPY/HOLMIUM LASER/STENT EXCHANGE;  Surgeon: Hildred Laser, MD;  Location: [facility] ORS;  Service: Urology;  Laterality: Bilateral;  NEEDS 90 MIN    EXTERNAL FIXATION LEG Left 02/22/2017   Procedure: EXTERNAL FIXATION LEFT ANKLE;  Surgeon: Sheral Apley, MD;  Location: [facility] OR;  Service: Orthopedics;  Laterality: Left;   HEMORRHOID  BANDING  1970s   INGUINAL HERNIA REPAIR Left 1962   KIDNEY STONE SURGERY  1986   "cut me open"   LEFT HEART CATH AND CORS/GRAFTS ANGIOGRAPHY N/A 10/25/2018   Procedure: LEFT HEART CATH AND CORS/GRAFTS ANGIOGRAPHY;  Surgeon: Marykay Lex, MD;  Location: [facility] INVASIVE CV LAB;  Service: Cardiovascular;  Laterality: N/A;   LEFT HEART CATH AND CORS/GRAFTS ANGIOGRAPHY N/A 12/03/2021   Procedure: LEFT HEART CATH AND CORS/GRAFTS ANGIOGRAPHY;  Surgeon: Swaziland, [name], MD;  Location: [facility] INVASIVE CV LAB;  Service: Cardiovascular;  Laterality: N/A;   POSTERIOR FUSION CERVICAL SPINE  2006   TEE WITHOUT CARDIOVERSION N/A 03/30/2015   Procedure: TRANSESOPHAGEAL ECHOCARDIOGRAM (TEE);  Surgeon: Delight Ovens, MD;  Location: [facility] OR;  Service: Open Heart Surgery;  Laterality: N/A;   TESTICLE TORSION REDUCTION Right 1960   TOE FUSION Left 1985 & 2004   "great toe" and removal   TONSILLECTOMY     TOTAL HIP ARTHROPLASTY Right 10/27/2015   Procedure: RIGHT TOTAL HIP ARTHROPLASTY ANTERIOR APPROACH and steroid injection right foot;  Surgeon: Kathryne Hitch, MD;  Location: [facility] OR;  Service: Orthopedics;  Laterality: Right;   TOTAL HIP ARTHROPLASTY Left 08/13/2019   TOTAL HIP ARTHROPLASTY Left 08/13/2019   Procedure: LEFT TOTAL HIP ARTHROPLASTY ANTERIOR APPROACH;  Surgeon: Kathryne Hitch, MD;  Location: [facility] OR;  Service: Orthopedics;  Laterality: Left;   Past Medical History:  Diagnosis Date   Allergy    Anxiety    PTSD   Aortic stenosis, moderate 03/27/2015   post AVR echo Echo 11/16: Mild LVH, EF 55-60%, normal wall motion, grade 1 diastolic dysfunction, bioprosthetic AVR okay (mean gradient 18 mmHg) with trivial AI, mild LAE, atrial septal aneurysm, small effusion    Arthritis    "qwhere" (10/29/2015)   Cancer (HCC)    skin cancer   Carotid artery stenosis    Carotid US 11/17: 1-39% bilateral ICA stenosis. F/u prn   Cervical stenosis of spinal canal    fusion 2006   CHF (congestive heart  failure) (HCC)    "secondry to [facility]"   Closed left pilon fracture    COLONIC POLYPS, HX OF    Complication of anesthesia    "takes a lot to put him under and has woken up during surgery" Difficult to wake up . PTSD; "does not metabolize RX well"that he gets "violent", per pt.    Coronary artery disease    DIVERTICULOSIS, COLON  Dysrhythmia    2 bouts of afib post op and at home but corrected with medication.   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)    confirmed heterozygous 10/24/14 labs   PTSD (post-traumatic stress disorder)    PTSD (post-traumatic stress disorder)    Sleep apnea    On a CPAP   BP 134/67   Pulse 61   Ht 5\' 8"  (1.727 m)   Wt 206 lb 12.8 oz (93.8 kg)   SpO2 95%   BMI 31.44 kg/m   Opioid Risk Score:   Fall Risk Score:  `1  Depression screen PHQ 2/9     10/19/2022    1:26 PM 10/05/2022   10:03 AM 09/23/2022    2:23 PM 09/01/2022    3:32 PM 06/28/2022   11:32 AM 04/20/2022    1:33 PM 04/20/2022    1:31 PM  Depression screen PHQ 2/9  Decreased Interest 2 0 0 0 0 0 0  Down, Depressed, Hopeless 2 0 0 0 0 0 0  PHQ - 2 Score 4 0 0 0 0 0 0  Altered sleeping 3  3 0     Tired, decreased energy 2  0 0     Change in appetite 1  0 0     Feeling bad or failure about yourself  1  0 0     Trouble concentrating 3  0 0     Moving slowly or fidgety/restless 1  0 0     Suicidal thoughts 0  0 0     PHQ-9 Score 15  3 0      Difficult doing work/chores   Not difficult at all Not difficult at all        Review of Systems  Constitutional:  Positive for appetite change.       Poor appetite  HENT: Negative.    Eyes: Negative.   Respiratory:  Positive for apnea and shortness of breath.   Cardiovascular:  Positive for leg swelling.  Gastrointestinal:  Positive for abdominal pain and nausea.  Endocrine: Negative.   Genitourinary: Negative.   Musculoskeletal:  Positive for gait problem.       RUQ pain due to gallbladder  Skin: Negative.   Allergic/Immunologic: Negative.   Neurological:  Positive for tremors and weakness.       Has CYP450 genetic expression unable to take a lot of medications  Hematological:  Bruises/bleeds easily.       Eliquis  Psychiatric/Behavioral:  Positive for confusion. The patient is nervous/anxious.   All other systems reviewed and are negative.      Objective:   Physical Exam   PE: Constitution: Appropriate appearance for age. No apparent distress  +Obese Resp: No respiratory distress. No accessory muscle usage. on  RA and CTAB Cardio: Well perfused appearance. No peripheral edema. Abdomen: Nondistended. Nontender.   Psych: Appropriate mood and affect. Neuro: AAOx4. No apparent cognitive deficits   Neurologic Exam:   Hoffmans: negative b/l Sensory exam: revealed normal sensation in all dermatomal regions in bilateral upper extremities and bilateral lower extremities + TTP R>L trapezius, cervical paraspinals, and levator scapulae No HA reproduced on palpation of GON or LON bilaterallt  Motor exam: strength 5/5 throughout bilateral upper extremities and bilateral lower extremities Neck ROM limited in extension, BL sidebending, and BL rotaiton  Coordination: + Bl UE and head intention tremors. No ataxia.  Gait:  stiff, decreased L knee flexion, wide based with BL exeternally rotated feet. Good clearance with stance and stride.   CN: + L tongue deviation; + R hearing  deficit; other Cns intact      Assessment & Plan:   Troy Banks is a 82 y.o. year old male  who  has a past medical history of Allergy, Anxiety, Aortic stenosis, moderate (03/27/2015), Arthritis, Cancer (HCC), Carotid artery stenosis, Cervical stenosis of spinal canal, CHF (congestive heart failure) (HCC), Closed left pilon fracture, COLONIC POLYPS, HX OF, Complication of anesthesia, Coronary artery disease, DIVERTICULOSIS, COLON, Dysrhythmia, Familial tremor (03/19/2014), Family history of adverse reaction to anesthesia, GERD (gastroesophageal reflux disease), GOUT, Headache, Heart murmur, High cholesterol, History of blood transfusion, History of kidney stones, HYPERTENSION, Hypothyroidism, Myocardial infarction (HCC), OSA on CPAP, Pneumonia, 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   They are presenting to PM&R clinic as a new patient for treatment of chronic headaches and cervicalgia.   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

## 2022-10-21 ENCOUNTER — Telehealth: Payer: Self-pay

## 2022-10-21 NOTE — Progress Notes (Signed)
COVID Vaccine Completed:  Yes  Date of COVID positive in last 90 days:  PCP - Oliver Barre, MD Cardiologist - Peter Swaziland, MD Neurologist - Lurena Joiner [name], DO  Clearance appt scheduled for 10/24/22  Chest x-ray - 09-15-22 Epic EKG - 09-27-22 Epic Stress Test - 11-01-16 Epic ECHO - 09-16-22 Epic Cardiac Cath - 12-03-21 Epic Pacemaker/ICD device last checked: Spinal Cord Stimulator:  Bowel Prep -   Sleep Study - Yes, +sleep apnea CPAP -   Fasting Blood Sugar -  Checks Blood Sugar _____ times a day  Last dose of GLP1 agonist-  N/A GLP1 instructions:  N/A   Last dose of SGLT-2 inhibitors-  N/A SGLT-2 instructions: N/A   Blood Thinner Instructions:  Eliquis Aspirin Instructions:  ASA 81 Last Dose:  Activity level:  Can go up a flight of stairs and perform activities of daily living without stopping and without symptoms of chest pain or shortness of breath.  Able to exercise without symptoms  Unable to go up a flight of stairs without symptoms of     Anesthesia review:  CAD, CHF, hx of Afib, mod aortic stenosis, carotid artery stenosis, PAD.  Hx of CABG and aortic valve replacement, HTN, DM  Patient denies shortness of breath, fever, cough and chest pain at PAT appointment  Patient verbalized understanding of instructions that were given to them at the PAT appointment. Patient was also instructed that they will need to review over the PAT instructions again at home before surgery.

## 2022-10-21 NOTE — Progress Notes (Signed)
Surgery orders requested with Dr. Martin's office. °

## 2022-10-21 NOTE — Telephone Encounter (Signed)
Transition Care Management Unsuccessful Follow-up Telephone Call  Date of discharge and from where:  10/13/2022 [facility]  Attempts:  1st Attempt  Reason for unsuccessful TCM follow-up call:  Left voice message  Troy Banks [place]  [facility] Community Resource Care Guide   ??[email or web address]  ?? [phone]   Website: [email or web address]  [email or web address]      

## 2022-10-21 NOTE — Patient Instructions (Addendum)
SURGICAL WAITING ROOM VISITATION Patients having surgery or a procedure may have no more than 2 support people in the waiting area - these visitors may rotate.    Children under the age of 87 must have an adult with them who is not the patient.  If the patient needs to stay at the hospital during part of their recovery, the visitor guidelines for inpatient rooms apply. Pre-op nurse will coordinate an appropriate time for 1 support person to accompany patient in pre-op.  This support person may not rotate.    Please refer to the [facility] website for the visitor guidelines for Inpatients (after your surgery is over and you are in a regular room).       Your procedure is scheduled on: 10-28-22   Report to [facility] Main Entrance    Report to admitting at 10:45 AM   Call this number if you have problems the morning of surgery [phone]   Do not eat food :After Midnight.   After Midnight you may have the following liquids until 10:00 AM/ DAY OF SURGERY  Water Non-Citrus Juices (without pulp, NO RED-Apple, White grape, White cranberry) Black Coffee (NO MILK/CREAM OR CREAMERS, sugar ok)  Clear Tea (NO MILK/CREAM OR CREAMERS, sugar ok) regular and decaf                             Plain Jell-O (NO RED)                                           Fruit ices (not with fruit pulp, NO RED)                                     Popsicles (NO RED)                                                               Sports drinks like Gatorade (NO RED)                   The day of surgery:  Drink ONE (1) Pre-Surgery Clear Ensure G2 at 10:00 AM the morning of surgery. Drink in one sitting. Do not sip.  This drink was given to you during your hospital  pre-op appointment visit. Nothing else to drink after completing the Pre-Surgery G2.          If you have questions, please contact your surgeon's office.   FOLLOW  ANY ADDITIONAL PRE OP INSTRUCTIONS YOU RECEIVED FROM YOUR SURGEON'S  OFFICE!!!     Oral Hygiene is also important to reduce your risk of infection.                                    Remember - BRUSH YOUR TEETH THE MORNING OF SURGERY WITH YOUR REGULAR TOOTHPASTE   Do NOT smoke after Midnight   Take these medicines the morning of surgery with A SIP OF WATER:   Allopurinol  Amlodipine  Diltiazem  Pepcid  Isosorbide  Levothyroxine  Rosuvastatin  Okay to use eyedrops  DO NOT TAKE ANY ORAL DIABETIC MEDICATIONS DAY OF YOUR SURGERY  Bring CPAP mask and tubing day of surgery.                              You may not have any metal on your body including  jewelry, and body piercing             Do not wear  lotions, powders, cologne, or deodorant              Men may shave face and neck.   Do not bring valuables to the hospital. [place] IS NOT RESPONSIBLE   FOR VALUABLES.   Contacts, dentures or bridgework may not be worn into surgery.  DO NOT BRING YOUR HOME MEDICATIONS TO THE HOSPITAL. PHARMACY WILL DISPENSE MEDICATIONS LISTED ON YOUR MEDICATION LIST TO YOU DURING YOUR ADMISSION IN THE HOSPITAL!    Patients discharged on the day of surgery will not be allowed to drive home.  Someone NEEDS to stay with you for the first 24 hours after anesthesia.   Special Instructions: Bring a copy of your healthcare power of attorney and living will documents the day of surgery if you haven't scanned them before.              Please read over the following fact sheets you were given: IF YOU HAVE QUESTIONS ABOUT YOUR PRE-OP INSTRUCTIONS PLEASE CALL [phone] Gwen  If you received a COVID test during your pre-op visit  it is requested that you wear a mask when out in public, stay away from anyone that may not be feeling well and notify your surgeon if you develop symptoms. If you test positive for Covid or have been in contact with anyone that has tested positive in the last 10 days please notify you surgeon.   - Preparing for Surgery Before  surgery, you can play an important role.  Because skin is not sterile, your skin needs to be as free of germs as possible.  You can reduce the number of germs on your skin by washing with CHG (chlorahexidine gluconate) soap before surgery.  CHG is an antiseptic cleaner which kills germs and bonds with the skin to continue killing germs even after washing. Please DO NOT use if you have an allergy to CHG or antibacterial soaps.  If your skin becomes reddened/irritated stop using the CHG and inform your nurse when you arrive at Short Stay. Do not shave (including legs and underarms) for at least 48 hours prior to the first CHG shower.  You may shave your face/neck.  Please follow these instructions carefully:  1.  Shower with CHG Soap the night before surgery and the  morning of surgery.  2.  If you choose to wash your hair, wash your hair first as usual with your normal  shampoo.  3.  After you shampoo, rinse your hair and body thoroughly to remove the shampoo.                             4.  Use CHG as you would any other liquid soap.  You can apply chg directly to the skin and wash.  Gently with a scrungie or clean washcloth.  5.  Apply the CHG Soap to your body ONLY FROM THE NECK DOWN.  Do   not use on face/ open                           Wound or open sores. Avoid contact with eyes, ears mouth and   genitals (private parts).                       Wash face,  Genitals (private parts) with your normal soap.             6.  Wash thoroughly, paying special attention to the area where your    surgery  will be performed.  7.  Thoroughly rinse your body with warm water from the neck down.  8.  DO NOT shower/wash with your normal soap after using and rinsing off the CHG Soap.                9.  Pat yourself dry with a clean towel.            10.  Wear clean pajamas.            11.  Place clean sheets on your bed the night of your first shower and do not  sleep with pets. Day of Surgery : Do not apply  any lotions/deodorants the morning of surgery.  Please wear clean clothes to the hospital/surgery center.  FAILURE TO FOLLOW THESE INSTRUCTIONS MAY RESULT IN THE CANCELLATION OF YOUR SURGERY  PATIENT SIGNATURE_________________________________  NURSE SIGNATURE__________________________________  ________________________________________________________________________

## 2022-10-24 ENCOUNTER — Encounter
Admission: RE | Admit: 2022-10-24 | Discharge: 2022-10-24 | Disposition: A | Discharge status: Home / Self Care | Payer: Medicare Other | Source: Ambulatory Visit | Attending: Surgery | Admitting: Surgery

## 2022-10-24 ENCOUNTER — Encounter (HOSPITAL_COMMUNITY): Payer: Self-pay

## 2022-10-24 ENCOUNTER — Telehealth: Payer: Self-pay

## 2022-10-24 ENCOUNTER — Ambulatory Visit (INDEPENDENT_AMBULATORY_CARE_PROVIDER_SITE_OTHER): Payer: Medicare Other | Admitting: Nurse Practitioner

## 2022-10-24 ENCOUNTER — Other Ambulatory Visit: Payer: Self-pay

## 2022-10-24 VITALS — BP 146/68 | HR 72 | Temp 98.3°F | Resp 16 | Ht 68.5 in | Wt 198.0 lb

## 2022-10-24 DIAGNOSIS — Z951 Presence of aortocoronary bypass graft: Secondary | ICD-10-CM | POA: Diagnosis not present

## 2022-10-24 DIAGNOSIS — E039 Hypothyroidism, unspecified: Secondary | ICD-10-CM | POA: Insufficient documentation

## 2022-10-24 DIAGNOSIS — K8010 Calculus of gallbladder with chronic cholecystitis without obstruction: Secondary | ICD-10-CM | POA: Insufficient documentation

## 2022-10-24 DIAGNOSIS — I25119 Atherosclerotic heart disease of native coronary artery with unspecified angina pectoris: Secondary | ICD-10-CM | POA: Insufficient documentation

## 2022-10-24 DIAGNOSIS — F17210 Nicotine dependence, cigarettes, uncomplicated: Secondary | ICD-10-CM | POA: Insufficient documentation

## 2022-10-24 DIAGNOSIS — I252 Old myocardial infarction: Secondary | ICD-10-CM | POA: Diagnosis not present

## 2022-10-24 DIAGNOSIS — I350 Nonrheumatic aortic (valve) stenosis: Secondary | ICD-10-CM | POA: Insufficient documentation

## 2022-10-24 DIAGNOSIS — I509 Heart failure, unspecified: Secondary | ICD-10-CM | POA: Insufficient documentation

## 2022-10-24 DIAGNOSIS — E119 Type 2 diabetes mellitus without complications: Secondary | ICD-10-CM | POA: Diagnosis not present

## 2022-10-24 DIAGNOSIS — I4891 Unspecified atrial fibrillation: Secondary | ICD-10-CM | POA: Diagnosis not present

## 2022-10-24 DIAGNOSIS — I110 Hypertensive heart disease with heart failure: Secondary | ICD-10-CM | POA: Insufficient documentation

## 2022-10-24 DIAGNOSIS — G4733 Obstructive sleep apnea (adult) (pediatric): Secondary | ICD-10-CM | POA: Diagnosis not present

## 2022-10-24 DIAGNOSIS — I4892 Unspecified atrial flutter: Secondary | ICD-10-CM | POA: Diagnosis not present

## 2022-10-24 DIAGNOSIS — Z01810 Encounter for preprocedural cardiovascular examination: Secondary | ICD-10-CM

## 2022-10-24 DIAGNOSIS — Z01812 Encounter for preprocedural laboratory examination: Secondary | ICD-10-CM | POA: Diagnosis not present

## 2022-10-24 HISTORY — DX: Dyspnea, unspecified: R06.00

## 2022-10-24 HISTORY — DX: Depression, unspecified: F32.A

## 2022-10-24 LAB — GLUCOSE, CAPILLARY: Glucose-Capillary: 185 mg/dL — ABNORMAL HIGH (ref 70–99)

## 2022-10-24 LAB — HEMOGLOBIN A1C
Hgb A1c MFr Bld: 6.6 % — ABNORMAL HIGH (ref 4.8–5.6)
Mean Plasma Glucose: 142.72 mg/dL

## 2022-10-24 NOTE — Telephone Encounter (Signed)
Transition Care Management Follow-up Telephone Call Date of discharge and from where: 10/13/2022 [facility] How have you been since you were released from the hospital? Patient is scheduled for surgery 5/24 Any questions or concerns? No  Items Reviewed: Did the pt receive and understand the discharge instructions provided? Yes  Medications obtained and verified? Yes  Other? No  Any new allergies since your discharge? No  Dietary orders reviewed? Yes Do you have support at home? Yes   Follow up appointments reviewed:  PCP Hospital f/u appt confirmed? No  Scheduled to see  on  @ . Specialist Hospital f/u appt confirmed? Yes  Scheduled to see Luretha Murphy MD on 10/14/2022 @ [facility]. Are transportation arrangements needed? No  If their condition worsens, is the pt aware to call PCP or go to the Emergency Dept.? Yes Was the patient provided with contact information for the PCP's office or ED? Yes Was to pt encouraged to call back with questions or concerns? Yes  Laron Angelini [facility]  [facility] Population Health Community Resource Care Guide   [email or web address]  ?? [phone]   Website: [email or web address]  [email or web address]

## 2022-10-24 NOTE — Progress Notes (Signed)
Virtual Visit via Telephone Note   Because of Troy Banks's co-morbid illnesses, he is at least at moderate risk for complications without adequate follow up.  This format is felt to be most appropriate for this patient at this time.  The patient did not have access to video technology/had technical difficulties with video requiring transitioning to audio format only (telephone).  All issues noted in this document were discussed and addressed.  No physical exam could be performed with this format.  Please refer to the patient's chart for his consent to telehealth for [facility].  Evaluation Performed:  Preoperative cardiovascular risk assessment _____________   Date:  10/24/2022   Patient ID:  Troy Banks, DOB [birth year], MRN [id number] Patient Location:  Home Provider location:   Office  Primary Care Provider:  Corwin Levins, MD Primary Cardiologist:  Peter Swaziland, MD  Chief Complaint / Patient Profile   82 y.o. y/o male with a h/o CAD s/p CABG x 4 in 2016, aortic valve stenosis s/p AVR in 2016, paroxysmal atrial fibrillation/atrial flutter, chronic diastolic heart failure, PAD, hypertension, hyperlipidemia, type 2 diabetes, hypothyroidism, essential tremor, migraines, and OSA who is pending cholecystectomy with Dr. Luretha Murphy of [facility] surgery and presents today for telephonic preoperative cardiovascular risk assessment.  History of Present Illness    Troy Banks is a 82 y.o. male who presents via audio/video conferencing for a telehealth visit today.  Pt was last seen in cardiology clinic on 09/27/2022 by Carlos Levering, NP.  At that time [name] was doing well though he did note intermittent chest discomfort.  Ischemic evaluation was deferred.  Imdur was increased. The patient is now pending procedure as outlined above. Since his last visit, he has done well from a cardiac standpoint.   He denies chest pain, palpitations,  dyspnea, pnd, orthopnea, n, v, dizziness, syncope, edema, weight gain, or early satiety. All other systems reviewed and are otherwise negative except as noted above.   Past Medical History    Past Medical History:  Diagnosis Date   Allergy    Anxiety    PTSD   Aortic stenosis, moderate 03/27/2015   post AVR echo Echo 11/16: Mild LVH, EF 55-60%, normal wall motion, grade 1 diastolic dysfunction, bioprosthetic AVR okay (mean gradient 18 mmHg) with trivial AI, mild LAE, atrial septal aneurysm, small effusion    Arthritis    "qwhere" (10/29/2015)   Cancer (HCC)    skin cancer   Carotid artery stenosis    Carotid US 11/17: 1-39% bilateral ICA stenosis. F/u prn   Cervical stenosis of spinal canal    fusion 2006   CHF (congestive heart failure) (HCC)    "secondry to OHS"   Closed left pilon fracture    COLONIC POLYPS, HX OF    Complication of anesthesia    "takes a lot to put him under and has woken up during surgery" Difficult to wake up . PTSD; "does not metabolize RX well"that he gets "violent", per pt.    Coronary artery disease    Depression    DIVERTICULOSIS, COLON    Dyspnea    Dysrhythmia    2 bouts of afib post op and at home but corrected with medication.   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)    confirmed heterozygous 10/24/14 labs   PTSD (post-traumatic stress disorder)    PTSD (post-traumatic stress disorder)    Sleep  apnea    On a CPAP   Past Surgical History:  Procedure Laterality Date   ANKLE FUSION Left 03/17/2017   Procedure: FUSION  PILON FRACTURE WITH BONE GRAFTING;  Surgeon: Roby Lofts, MD;  Location: [facility] OR;  Service: Orthopedics;  Laterality: Left;   ANTERIOR FUSION CERVICAL SPINE  2002   AORTIC VALVE REPLACEMENT N/A 03/30/2015   Procedure: AORTIC VALVE REPLACEMENT (AVR);  Surgeon: Delight Ovens, MD;  Location: [facility] OR;  Service: Open Heart Surgery;  Laterality: N/A;   CARDIAC CATHETERIZATION N/A 03/26/2015   Procedure: Left Heart Cath and Coronary Angiography;  Surgeon: Marykay Lex, MD;  Location: [facility] INVASIVE CV LAB;  Service: Cardiovascular;  Laterality: N/A;   CARDIAC VALVE REPLACEMENT     COLONOSCOPY W/ POLYPECTOMY     CORONARY ARTERY BYPASS GRAFT N/A 03/30/2015   Procedure: CORONARY ARTERY BYPASS GRAFTING (CABG) x four, using left internal mammary artery and left    leg greater saphenous vein harvested endoscopically ;  Surgeon: Delight Ovens, MD;  Location: [facility] OR;  Service: Open Heart Surgery;  Laterality: N/A;   CORONARY/GRAFT ANGIOGRAPHY N/A 12/03/2021   Procedure: CORONARY/GRAFT ANGIOGRAPHY;  Surgeon: Swaziland, [name], MD;  Location: [facility] INVASIVE CV LAB;  Service: Cardiovascular;  Laterality: N/A;   CYSTOSCOPY/URETEROSCOPY/HOLMIUM LASER/STENT PLACEMENT Bilateral 11/07/2016   Procedure: CYSTOSCOPY/URETEROSCOPY/HOLMIUM LASER/STENT PLACEMENT;  Surgeon: Hildred Laser, MD;  Location: [facility] ORS;  Service: Urology;  Laterality: Bilateral;   CYSTOSCOPY/URETEROSCOPY/HOLMIUM LASER/STENT PLACEMENT Bilateral 11/23/2016   Procedure: CYSTOSCOPY/BILATERAL URETEROSCOPY/HOLMIUM LASER/STENT EXCHANGE;  Surgeon: Hildred Laser, MD;  Location: [facility] ORS;  Service: Urology;  Laterality: Bilateral;  NEEDS 90 MIN    EXTERNAL FIXATION LEG Left 02/22/2017   Procedure: EXTERNAL FIXATION LEFT ANKLE;  Surgeon: Sheral Apley, MD;  Location: [facility] OR;  Service: Orthopedics;  Laterality: Left;    HEMORRHOID BANDING  1970s   INGUINAL HERNIA REPAIR Left 1962   KIDNEY STONE SURGERY  1986   "cut me open"   LEFT HEART CATH AND CORS/GRAFTS ANGIOGRAPHY N/A 10/25/2018   Procedure: LEFT HEART CATH AND CORS/GRAFTS ANGIOGRAPHY;  Surgeon: Marykay Lex, MD;  Location: [facility] INVASIVE CV LAB;  Service: Cardiovascular;  Laterality: N/A;   LEFT HEART CATH AND CORS/GRAFTS ANGIOGRAPHY N/A 12/03/2021   Procedure: LEFT HEART CATH AND CORS/GRAFTS ANGIOGRAPHY;  Surgeon: Swaziland, [name], MD;  Location: [facility] INVASIVE CV LAB;  Service: Cardiovascular;  Laterality: N/A;   POSTERIOR FUSION CERVICAL SPINE  2006   TEE WITHOUT CARDIOVERSION N/A 03/30/2015   Procedure: TRANSESOPHAGEAL ECHOCARDIOGRAM (TEE);  Surgeon: Delight Ovens, MD;  Location: [facility] OR;  Service: Open Heart Surgery;  Laterality: N/A;   TESTICLE TORSION REDUCTION Right 1960   TOE FUSION Left 1985 & 2004   "great toe" and removal   TONSILLECTOMY     TOTAL HIP ARTHROPLASTY Right 10/27/2015   Procedure:  RIGHT TOTAL HIP ARTHROPLASTY ANTERIOR APPROACH and steroid injection right foot;  Surgeon: Kathryne Hitch, MD;  Location: [facility] OR;  Service: Orthopedics;  Laterality: Right;   TOTAL HIP ARTHROPLASTY Left 08/13/2019   TOTAL HIP ARTHROPLASTY Left 08/13/2019   Procedure: LEFT TOTAL HIP ARTHROPLASTY ANTERIOR APPROACH;  Surgeon: Kathryne Hitch, MD;  Location: [facility] OR;  Service: Orthopedics;  Laterality: Left;    Allergies  Allergies  Allergen Reactions   Acyclovir And Related Other (See Comments)    Accumulates and causes stroke-like side-effects due to cytochrome P450 enzyme deficiency   Benzodiazepines Other (See Comments)    Patient states he gets stroke symptoms with benzo's.    Compazine [Prochlorperazine Maleate] Other (See Comments)    Accumulates and causes stroke-like side-effects due to cytochrome P450 enzyme deficiency   Coreg [Carvedilol] Other (See Comments)    Accumulates and causes stroke-like side-effects due to cytochrome  P450 enzyme deficiency Patient poor metabolizer of CYP2D6 - Coreg undergoes extensive hepatic (including 2D6)   Flomax [Tamsulosin Hcl] Other (See Comments)    Accumulates and causes stroke-like side-effects due to cytochrome P450 enzyme deficiency   Lisinopril Other (See Comments)    Hyperkalemia. 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   Mobic [Meloxicam] Other (See Comments)    Accumulates and causes stroke-like side-effects due to cytochrome P450 enzyme deficiency   Morphine And Codeine Other (See Comments)    Per pt's wife morphine also contraindicated due to p450 enzyme deficiency.     Ondansetron Other (See Comments)    Accumulates and causes stroke-like side-effects due to cytochrome P450 enzyme deficiency    Oxycodone Other (See Comments)    Accumulates and causes stroke-like side-effects due to cytochrome P450 enzyme deficiency   Promethazine Other (See Comments)    Accumulates and causes stroke-like side-effects due to cytochrome P450 enzyme deficiency   Sertraline Hcl Other (See Comments)    Accumulates and causes stroke-like side-effects due to cytochrome P450 enzyme deficiency   Tramadol Other (See Comments)    Accumulates and causes stroke-like side-effects due to cytochrome P450 enzyme deficiency   Atorvastatin Other (See Comments)    MYALGIAS   Colchicine Other (See Comments)    ataxia   Fentanyl Other (See Comments)    Accumulates and cause stroke like symptoms    Home Medications    Prior to Admission medications   Medication Sig Start Date End Date Taking? Authorizing Provider  allopurinol (ZYLOPRIM) 300 MG tablet TAKE 1 TABLET BY MOUTH EVERY DAY 05/02/22   Corwin Levins, MD  amLODipine (NORVASC) 10 MG tablet Take 10 mg by mouth daily.    [provider]  apixaban (ELIQUIS) 5 MG TABS tablet Take 1 tablet (5 mg total) by mouth 2 (two) times daily. 09/27/22   Carlos Levering, NP  Ascorbic Acid  (VITAMIN C) 1000 MG tablet Take 1,000 mg by mouth daily.    [provider]  aspirin EC 81 MG tablet Take 81 mg by mouth every other day. Swallow whole.    [provider]  Carboxymethylcellulose Sodium (LUBRICANT EYE DROPS OP) Place 1 drop into both eyes 2 (two) times daily as needed (dry/irritated eyes.).     [provider]  Cholecalciferol (VITAMIN D-3) 25 MCG (1000 UT) CAPS Take 1,000 Units by mouth every other day.    [provider]  diclofenac Sodium (VOLTAREN) 1 % GEL Apply 2 g topically in the morning and at bedtime.    [provider]  diltiazem (CARDIZEM CD) 240 MG 24 hr capsule Take 1 capsule (240 mg total) by mouth daily. 09/18/22   Rhetta Mura, MD  diltiazem (CARTIA XT) 240 MG 24 hr capsule Take 1 capsule (240 mg total) by mouth daily for 4 days. Patient not taking: Reported on 10/21/2022 09/17/22 09/21/22  Rhetta Mura, MD  famotidine (PEPCID) 40 MG tablet Take 1 tablet (40 mg total) by mouth daily. 06/28/22   Corwin Levins, MD  furosemide (LASIX) 40 MG tablet TAKE 1 TABLET BY MOUTH EVERY OTHER DAY 05/20/22   Swaziland, [name], MD  isosorbide mononitrate (IMDUR) 60 MG 24 hr tablet Take 1 tablet (60 mg total) by mouth daily. 09/27/22   Carlos Levering, NP  levothyroxine (SYNTHROID) 25 MCG tablet TAKE 1 TABLET BY MOUTH EVERY MORNING WITH BREAKFAST 10/14/22   Corwin Levins, MD  nitroGLYCERIN (NITROSTAT) 0.4 MG SL tablet PLACE 1 TABLET UNDER THE TONGUE EVERY 5 MINUTES AS NEEDED FOR CHEST PAIN. 11/30/21   Swaziland, [name], MD  NON FORMULARY Pt uses a cpap nightly    [provider]  rosuvastatin (CRESTOR) 10 MG tablet TAKE 1/2 TABLET BY MOUTH EVERY DAY 09/21/22   Rollene Rotunda, MD  tiZANidine (ZANAFLEX) 4 MG tablet Take 2 mg by mouth every 8 (eight) hours as needed for muscle spasms. 12/18/17   [provider]  Turmeric 500 MG CAPS Take 500 mg by mouth daily.     [provider]  vitamin B-12  (CYANOCOBALAMIN) 1000 MCG tablet Take 1,000 mcg by mouth every other day.    [provider]  ranitidine (ZANTAC) 150 MG tablet TAKE 1 TABLET BY MOUTH 2 TIMES DAILY Patient taking differently: Take 150 mg by mouth 2 (two) times a day. 09/05/18 12/13/18  Corwin Levins, MD    Physical Exam    Vital Signs:  [name] does not have vital signs available for review today.  Given telephonic nature of communication, physical exam is limited. AAOx3. NAD. Normal affect.  Speech and respirations are unlabored.  Accessory Clinical Findings    None  Assessment & Plan    1.  Preoperative Cardiovascular Risk Assessment:  According to the Revised Cardiac Risk Index (RCRI), his Perioperative Risk of Major Cardiac Event is (%): 6.6. His Functional Capacity in METs is: 7.01 according to the [facility] Activity Status Index (DASI).Therefore, based on ACC/AHA guidelines, patient would be at acceptable risk for the planned procedure without further cardiovascular testing.  The patient was advised that if he develops new symptoms prior to surgery to contact our office to arrange for a follow-up visit, and he verbalized understanding.  Per office protocol, patient can hold Eliquis for 1-2 days prior to procedure. Please resume Eliquis as soon as safely possible given elevated CV risk. Regarding ASA therapy, we recommend continuation of ASA throughout the perioperative period.  However, if the surgeon feels that cessation of ASA is required in the perioperative period, it may be stopped 5-7 days prior to surgery with a plan to resume it as soon as felt to be feasible from a surgical standpoint in the post-operative period.   A copy of this note will be routed to requesting surgeon.  Time:   Today, I have spent 7 minutes with the patient with telehealth technology discussing medical history, symptoms, and management plan.     Joylene Grapes, NP  10/24/2022, 2:30 PM

## 2022-10-25 NOTE — Progress Notes (Signed)
Case: [id number] Date/Time: 10/28/22 1245   Procedure: LAPAROSCOPIC CHOLECYSTECTOMY WITH INTRAOPERATIVE CHOLANGIOGRAM   Anesthesia type: General   Pre-op diagnosis: CHRONIC CHOLECYSTITIS   Location: [facility] ROOM 02 / [facility]   Surgeons: Luretha Murphy, MD       DISCUSSION: Troy Banks is a 82 year old male who presents to PAT prior to surgery listed above.  Patient was seen in the emergency department on 5/9 for abdominal pain.  He was diagnosed with symptomatic cholelithiasis and followed up with general surgery.  He is now scheduled for a lap chole due to suspected chronic cholecystitis.  Patient is a someday smoker with an extensive past medical history.  He has history of MI, CAD s/p CABG, aortic stenosis s/p AVR in 2016, CHF, atrial fib/flutter on Eliquis, carotid artery stenosis. A preoperative cardiovascular risk assessment was done on 5/20 via telephone visit:  "Preoperative Cardiovascular Risk Assessment:   According to the Revised Cardiac Risk Index (RCRI), his Perioperative Risk of Major Cardiac Event is (%): 6.6. His Functional Capacity in METs is: 7.01 according to the Duke Activity Status Index (DASI).Therefore, based on ACC/AHA guidelines, patient would be at acceptable risk for the planned procedure without further cardiovascular testing.   The patient was advised that if he develops new symptoms prior to surgery to contact our office to arrange for a follow-up visit, and he verbalized understanding.   Per office protocol, patient can hold Eliquis for 1-2 days prior to procedure. Please resume Eliquis as soon as safely possible given elevated CV risk. Regarding ASA therapy, we recommend continuation of ASA throughout the perioperative period.  However, if the surgeon feels that cessation of ASA is required in the perioperative period, it may be stopped 5-7 days prior to surgery with a plan to resume it as soon as felt to be feasible from a surgical standpoint in the post-operative  period."  Additionally patient with history of  hypertension, hyperlipidemia, type 2 diabetes, hypothyroidism, essential tremor, migraines, and OSA. Followed by PCP and are stable/controlled.  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 According to anesthesiologist Dr. Onalee Hua Joslin's 03/28/15 note, "From a cursory review of the literature, it appears this deficiency can occur in up to 6-10% of the caucasian population and results in reduced activity of the CYP2D^ enzyme.  This deficiency can result in the reduced metabolism of codeine , tramadol, SSRI inhibitors, beta blockers, ondanestron, and lidocaine among other meds. I have not found any contraindications in the literature to using benzodiazepines, fentanyl, volatile agents, propofol, muscle relaxants or other commonly used anesthetic agents in patients with this enzyme deficiency. Diazepam (Valium) is very lipid soluble and can accumulate in the body if given for an extended period of time."; please see Dr. Morley Kos note 03/28/15.    - Patient reports he requires increased anesthesia ("takes a lot to put me under") and also pt has PTSD, reports "will wake up fighting" if emerges too rapidly.   Family are requesting Dr. Molly Maduro or Autumn Patty as they have taken care of him in the past.  VS: BP (!) 146/68   Pulse 72   Temp 36.8 C (Oral)   Resp 16   Ht 5' 8.5" (1.74 m)   Wt 89.8 kg   SpO2 98%   BMI 29.67 kg/m   PROVIDERS: Corwin Levins, MD   LABS: Labs reviewed: Acceptable for surgery. (all labs ordered are listed, but only  abnormal results are displayed)  Labs Reviewed  HEMOGLOBIN A1C - Abnormal; Notable for the following components:      Result Value   Hgb A1c MFr Bld 6.6 (*)    All other components within normal limits  GLUCOSE, CAPILLARY - Abnormal; Notable for the following components:    Glucose-Capillary 185 (*)    All other components within normal limits     IMAGES: RUQ [place] 10/13/22:  IMPRESSION: Cholelithiasis, without evidence of acute cholecystitis.  CXR 09/15/22:  IMPRESSION: 1. Central pulmonary vascular congestion without overt pulmonary edema. 2.  Aortic Atherosclerosis (ICD10-I70.0).  EKG 09/16/22:  Sinus rhythm w first degree AV block with PVC Possible inferior infarct Appears similar to prior  CV:  Echo 09/16/22:  IMPRESSIONS     1. Left ventricular ejection fraction, by estimation, is 60 to 65%. The  left ventricle has normal function. The left ventricle has no regional  wall motion abnormalities. There is moderate left ventricular hypertrophy.  Left ventricular diastolic  parameters are consistent with Grade I diastolic dysfunction (impaired  relaxation).   2. Right ventricular systolic function is normal. The right ventricular  size is normal. There is normal pulmonary artery systolic pressure.   3. No evidence of mitral valve regurgitation.   4. S/p 23 mm Edwards Porcine AVR. V max 2.3 m/s. Mean gradient 12 mmHg.  DI 0.45. No PVL. Normal prosthesis. Aortic valve regurgitation is not  visualized.   5. Aortic aorta is not well visualized.   6. The inferior vena cava is dilated in size with >50% respiratory  variability, suggesting right atrial pressure of 8 mmHg.   LHC 12/03/21:    Ost LAD to Prox LAD lesion is 80% stenosed.   Mid LAD lesion is 100% stenosed.   Ost Cx lesion is 80% stenosed.   Prox RCA to Mid RCA lesion is 100% stenosed.   Ost Ramus-1 lesion is 90% stenosed.   Ost Ramus-2 lesion is 60% stenosed.   Prox Cx-2 lesion is 100% stenosed.   Prox Cx-1 lesion is 80% stenosed.   LIMA and is normal in caliber.   SVG and is large.   SVG and is large.   SVG and is large.   The graft exhibits no disease.   The graft exhibits minimal luminal irregularities.   The graft exhibits minimal luminal irregularities.   The graft  exhibits no disease.   3 vessel obstructive CAD.  All grafts are still patent No significant change from 2020.   Plan: recommend medical therapy. Some of his angina could be explained by disease in the native LCx proximal to a relatively small OM. Again this hasn't changed significantly.    Carotid US 12/19/19:  Summary:  Right Carotid: Velocities in the right ICA are consistent with a 1-39%  stenosis. The ECA appears >50% stenosed.   Left Carotid: Velocities in the left ICA are consistent with a 1-39%  stenosis. The ECA appears >50% stenosed.   Vertebrals:  Bilateral vertebral arteries demonstrate antegrade flow.  Subclavians: Normal flow hemodynamics were seen in bilateral subclavian               arteries.     Past Medical History:  Diagnosis Date   Allergy    Anxiety    PTSD   Aortic stenosis, moderate 03/27/2015   post AVR echo Echo 11/16: Mild LVH, EF 55-60%, normal wall motion, grade 1 diastolic dysfunction, bioprosthetic AVR okay (mean gradient 18 mmHg) with trivial AI, mild LAE, atrial septal aneurysm,  small effusion    Arthritis    "qwhere" (10/29/2015)   Cancer (HCC)    skin cancer   Carotid artery stenosis    Carotid US 11/17: 1-39% bilateral ICA stenosis. F/u prn   Cervical stenosis of spinal canal    fusion 2006   CHF (congestive heart failure) (HCC)    "secondry to OHS"   Closed left pilon fracture    COLONIC POLYPS, HX OF    Complication of anesthesia    "takes a lot to put him under and has woken up during surgery" Difficult to wake up . PTSD; "does not metabolize RX well"that he gets "violent", per pt.    Coronary artery disease    Depression    DIVERTICULOSIS, COLON    Dyspnea    Dysrhythmia    2 bouts of afib post op and at home but corrected with medication.   Familial tremor 03/19/2014   Family history of adverse reaction to anesthesia    Father - hold to get asleep   GERD (gastroesophageal reflux disease)    uses Zantac   GOUT    Headache     Heart murmur    had aortic value replacement   High cholesterol    History of blood transfusion    History of kidney stones    surgery   HYPERTENSION    off ACEI 2010 because of hyperkalemia in setting of ARI   Hypothyroidism    newly diaganosed 09/2015   Myocardial infarction 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)    confirmed heterozygous 10/24/14 labs   PTSD (post-traumatic stress disorder)    PTSD (post-traumatic stress disorder)    Sleep apnea    On a CPAP    Past Surgical History:  Procedure Laterality Date   ANKLE FUSION Left 03/17/2017   Procedure: FUSION  PILON FRACTURE WITH BONE GRAFTING;  Surgeon: Roby Lofts, MD;  Location: [facility];  Service: Orthopedics;  Laterality: Left;   ANTERIOR FUSION CERVICAL SPINE  2002   AORTIC VALVE REPLACEMENT N/A 03/30/2015   Procedure: AORTIC VALVE REPLACEMENT (AVR);  Surgeon: Delight Ovens, MD;  Location[facility] OR;  Service: Open Heart Surgery;  Laterality: N/A;   CARDIAC CATHETERIZATION N/A 03/26/2015   Procedure: Left Heart Cath and Coronary Angiography;  Surgeon: Marykay Lex, MD;  Location: [facility] INVASIVE CV LAB;  Service: Cardiovascular;  Laterality: N/A;   CARDIAC VALVE REPLACEMENT     COLONOSCOPY W/ POLYPECTOMY     CORONARY ARTERY BYPASS GRAFT N/A 03/30/2015   Procedure: CORONARY ARTERY BYPASS GRAFTING (CABG) x four, using left internal mammary artery and left    leg greater saphenous vein harvested endoscopically ;  Surgeon: Delight Ovens, MD;  Location: [facility];  Service: Open Heart Surgery;  Laterality: N/A;   CORONARY/GRAFT ANGIOGRAPHY N/A 12/03/2021   Procedure: CORONARY/GRAFT ANGIOGRAPHY;  Surgeon: Swaziland, [name], MD;  Location: [facility] INVASIVE CV LAB;  Service: Cardiovascular;  Laterality: N/A;   CYSTOSCOPY/URETEROSCOPY/HOLMIUM LASER/STENT PLACEMENT Bilateral 11/07/2016   Procedure: CYSTOSCOPY/URETEROSCOPY/HOLMIUM LASER/STENT PLACEMENT;   Surgeon: Hildred Laser, MD;  Location[facility];  Service: Urology;  Laterality: Bilateral;   CYSTOSCOPY/URETEROSCOPY/HOLMIUM LASER/STENT PLACEMENT Bilateral 11/23/2016   Procedure: CYSTOSCOPY/BILATERAL URETEROSCOPY/HOLMIUM LASER/STENT EXCHANGE;  Surgeon: Hildred Laser, MD;  Location[facility];  Service: Urology;  Laterality: Bilateral;  NEEDS 90 MIN    EXTERNAL FIXATION LEG Left 02/22/2017  Procedure: EXTERNAL FIXATION LEFT ANKLE;  Surgeon: Sheral Apley, MD;  Location: [facility] OR;  Service: Orthopedics;  Laterality: Left;   HEMORRHOID BANDING  1970s   INGUINAL HERNIA REPAIR Left 1962   KIDNEY STONE SURGERY  1986   "cut me open"   LEFT HEART CATH AND CORS/GRAFTS ANGIOGRAPHY N/A 10/25/2018   Procedure: LEFT HEART CATH AND CORS/GRAFTS ANGIOGRAPHY;  Surgeon: Marykay Lex, MD;  Location: [facility] INVASIVE CV LAB;  Service: Cardiovascular;  Laterality: N/A;   LEFT HEART CATH AND CORS/GRAFTS ANGIOGRAPHY N/A 12/03/2021   Procedure: LEFT HEART CATH AND CORS/GRAFTS ANGIOGRAPHY;  Surgeon: [place]az[place], [name], MD;  Location: [facility] INVASIVE CV LAB;  Service: Cardiovascular;  Laterality: N/A;   POSTERIOR FUSION CERVICAL SPINE  2006   TEE WITHOUT CARDIOVERSION N/A 03/30/2015   Procedure: TRANSESOPHAGEAL ECHOCARDIOGRAM (TEE);  Surgeon: Delight Ovens, MD;  Location: [facility] OR;  Service: Open Heart Surgery;  Laterality: N/A;   TESTICLE TORSION REDUCTION Right 1960   TOE FUSION Left 1985 & 2004   "great toe" and removal   TONSILLECTOMY     TOTAL HIP ARTHROPLASTY Right 10/27/2015   Procedure: RIGHT TOTAL HIP ARTHROPLASTY ANTERIOR APPROACH and steroid injection right foot;  Surgeon: Kathryne Hitch, MD;  [facility];  Service: Orthopedics;  Laterality: Right;   TOTAL HIP ARTHROPLASTY Left 08/13/2019   TOTAL HIP ARTHROPLASTY Left 08/13/2019   Procedure: LEFT TOTAL HIP ARTHROPLASTY ANTERIOR APPROACH;  Surgeon: Kathryne Hitch, MD;  Location[facility];  Service: Orthopedics;  Laterality: Left;     MEDICATIONS:  allopurinol (ZYLOPRIM) 300 MG tablet   amLODipine (NORVASC) 10 MG tablet   apixaban (ELIQUIS) 5 MG TABS tablet   Ascorbic Acid (VITAMIN C) 1000 MG tablet   aspirin EC 81 MG tablet   Carboxymethylcellulose Sodium (LUBRICANT EYE DROPS OP)   Cholecalciferol (VITAMIN D-3) 25 MCG (1000 UT) CAPS   diclofenac Sodium (VOLTAREN) 1 % GEL   diltiazem (CARDIZEM CD) 240 MG 24 hr capsule   diltiazem (CARTIA XT) 240 MG 24 hr capsule   famotidine (PEPCID) 40 MG tablet   furosemide (LASIX) 40 MG tablet   isosorbide mononitrate (IMDUR) 60 MG 24 hr tablet   levothyroxine (SYNTHROID) 25 MCG tablet   nitroGLYCERIN (NITROSTAT) 0.4 MG SL tablet   NON FORMULARY   rosuvastatin (CRESTOR) 10 MG tablet   tiZANidine (ZANAFLEX) 4 MG tablet   Turmeric 500 MG CAPS   vitamin B-12 (CYANOCOBALAMIN) 1000 MCG tablet   No current facility-administered medications for this encounter.   Marcille Blanco [facility] Short Stay/Anesthesiology [place] Phone [phone] 10/25/2022 10:46 AM

## 2022-10-25 NOTE — Anesthesia Preprocedure Evaluation (Addendum)
Anesthesia Evaluation  Patient identified by MRN, date of birth, ID band Patient awake    Reviewed: Allergy & Precautions, H&P , NPO status , Patient's Chart, lab work & pertinent test results  History of Anesthesia Complications (+) DIFFICULT AIRWAY and history of anesthetic complications  Airway Mallampati: III  TM Distance: <3 FB Neck ROM: Limited  Mouth opening: Limited Mouth Opening  Dental no notable dental hx. (+) Dental Advisory Given   Pulmonary sleep apnea and Continuous Positive Airway Pressure Ventilation , Current Smoker   Pulmonary exam normal breath sounds clear to auscultation       Cardiovascular hypertension, + CAD, + Past MI and + CABG   Rhythm:Regular Rate:Normal + Systolic murmurs    Neuro/Psych       Dementia Memory lossnegative neurological ROS     GI/Hepatic negative GI ROS, Neg liver ROS,,,  Endo/Other  negative endocrine ROS    Renal/GU negative Renal ROS  negative genitourinary   Musculoskeletal negative musculoskeletal ROS (+)    Abdominal   Peds negative pediatric ROS (+)  Hematology negative hematology ROS (+)   Anesthesia Other Findings   Reproductive/Obstetrics negative OB ROS                              Anesthesia Physical Anesthesia Plan  ASA: 3  Anesthesia Plan: General   Post-op Pain Management: Tylenol PO (pre-op)*   Induction: Intravenous  PONV Risk Score and Plan: 1 and Dexamethasone, Treatment may vary due to age or medical condition and Droperidol  Airway Management Planned: Oral ETT and Video Laryngoscope Planned  Additional Equipment:   Intra-op Plan:   Post-operative Plan: Extubation in OR  Informed Consent: I have reviewed the patients History and Physical, chart, labs and discussed the procedure including the risks, benefits and alternatives for the proposed anesthesia with the patient or authorized representative who has  indicated his/her understanding and acceptance.     Dental advisory given  Plan Discussed with: CRNA and Surgeon  Anesthesia Plan Comments: (See PAT note from 5/20 by K Gekas [name]. Patient and family specifically requesting Dr. Aleene Davidson on DOS)        Anesthesia Quick Evaluation

## 2022-10-27 ENCOUNTER — Ambulatory Visit: Payer: Self-pay | Admitting: Surgery

## 2022-10-28 ENCOUNTER — Encounter (HOSPITAL_COMMUNITY): Payer: Self-pay | Admitting: Surgery

## 2022-10-28 ENCOUNTER — Ambulatory Visit (HOSPITAL_COMMUNITY): Payer: Medicare Other | Admitting: Medical

## 2022-10-28 ENCOUNTER — Other Ambulatory Visit: Payer: Self-pay

## 2022-10-28 ENCOUNTER — Ambulatory Visit (HOSPITAL_BASED_OUTPATIENT_CLINIC_OR_DEPARTMENT_OTHER): Payer: Medicare Other | Admitting: Anesthesiology

## 2022-10-28 ENCOUNTER — Encounter (HOSPITAL_COMMUNITY)
Admission: RE | Disposition: A | Discharge status: Home / Self Care | Payer: Self-pay | Source: Ambulatory Visit | Attending: Surgery

## 2022-10-28 ENCOUNTER — Ambulatory Visit (HOSPITAL_COMMUNITY)
Admission: RE | Admit: 2022-10-28 | Discharge: 2022-10-28 | Disposition: A | Discharge status: Home / Self Care | Payer: Medicare Other | Source: Ambulatory Visit | Attending: Surgery | Admitting: Surgery

## 2022-10-28 ENCOUNTER — Ambulatory Visit (HOSPITAL_COMMUNITY): Payer: Medicare Other

## 2022-10-28 DIAGNOSIS — Z7901 Long term (current) use of anticoagulants: Secondary | ICD-10-CM | POA: Diagnosis not present

## 2022-10-28 DIAGNOSIS — G4733 Obstructive sleep apnea (adult) (pediatric): Secondary | ICD-10-CM

## 2022-10-28 DIAGNOSIS — Z951 Presence of aortocoronary bypass graft: Secondary | ICD-10-CM

## 2022-10-28 DIAGNOSIS — I480 Paroxysmal atrial fibrillation: Secondary | ICD-10-CM | POA: Insufficient documentation

## 2022-10-28 DIAGNOSIS — K812 Acute cholecystitis with chronic cholecystitis: Secondary | ICD-10-CM | POA: Diagnosis not present

## 2022-10-28 DIAGNOSIS — Z87442 Personal history of urinary calculi: Secondary | ICD-10-CM | POA: Insufficient documentation

## 2022-10-28 DIAGNOSIS — K811 Chronic cholecystitis: Secondary | ICD-10-CM | POA: Diagnosis not present

## 2022-10-28 DIAGNOSIS — F17200 Nicotine dependence, unspecified, uncomplicated: Secondary | ICD-10-CM | POA: Diagnosis not present

## 2022-10-28 DIAGNOSIS — F17210 Nicotine dependence, cigarettes, uncomplicated: Secondary | ICD-10-CM

## 2022-10-28 DIAGNOSIS — I2510 Atherosclerotic heart disease of native coronary artery without angina pectoris: Secondary | ICD-10-CM | POA: Diagnosis not present

## 2022-10-28 DIAGNOSIS — I10 Essential (primary) hypertension: Secondary | ICD-10-CM | POA: Diagnosis not present

## 2022-10-28 DIAGNOSIS — K8010 Calculus of gallbladder with chronic cholecystitis without obstruction: Secondary | ICD-10-CM | POA: Diagnosis not present

## 2022-10-28 DIAGNOSIS — E119 Type 2 diabetes mellitus without complications: Secondary | ICD-10-CM

## 2022-10-28 DIAGNOSIS — Z87891 Personal history of nicotine dependence: Secondary | ICD-10-CM | POA: Diagnosis not present

## 2022-10-28 DIAGNOSIS — I252 Old myocardial infarction: Secondary | ICD-10-CM | POA: Diagnosis not present

## 2022-10-28 DIAGNOSIS — Z9989 Dependence on other enabling machines and devices: Secondary | ICD-10-CM

## 2022-10-28 DIAGNOSIS — Z574 Occupational exposure to toxic agents in agriculture: Secondary | ICD-10-CM | POA: Diagnosis not present

## 2022-10-28 DIAGNOSIS — Z9049 Acquired absence of other specified parts of digestive tract: Secondary | ICD-10-CM | POA: Diagnosis not present

## 2022-10-28 HISTORY — PX: CHOLECYSTECTOMY: SHX55

## 2022-10-28 LAB — GLUCOSE, CAPILLARY: Glucose-Capillary: 161 mg/dL — ABNORMAL HIGH (ref 70–99)

## 2022-10-28 SURGERY — LAPAROSCOPIC CHOLECYSTECTOMY WITH INTRAOPERATIVE CHOLANGIOGRAM
Anesthesia: General

## 2022-10-28 MED ORDER — SUCCINYLCHOLINE CHLORIDE 200 MG/10ML IV SOSY
PREFILLED_SYRINGE | INTRAVENOUS | Status: AC
  Filled 2022-10-28: qty 10

## 2022-10-28 MED ORDER — LIDOCAINE HCL (PF) 2 % IJ SOLN
INTRAMUSCULAR | Status: AC
  Filled 2022-10-28: qty 5

## 2022-10-28 MED ORDER — SCOPOLAMINE 1 MG/3DAYS TD PT72: 1.0000 | MEDICATED_PATCH | TRANSDERMAL | Status: DC

## 2022-10-28 MED ORDER — CHLORHEXIDINE GLUCONATE CLOTH 2 % EX PADS: 6.0000 | MEDICATED_PAD | Freq: Once | CUTANEOUS | Status: DC

## 2022-10-28 MED ORDER — DEXAMETHASONE SODIUM PHOSPHATE 10 MG/ML IJ SOLN
INTRAMUSCULAR | Status: DC | PRN
  Administered 2022-10-28: 8 mg via INTRAVENOUS

## 2022-10-28 MED ORDER — DROPERIDOL 2.5 MG/ML IJ SOLN
INTRAMUSCULAR | Status: AC
  Filled 2022-10-28: qty 2

## 2022-10-28 MED ORDER — DEXMEDETOMIDINE HCL IN NACL 80 MCG/20ML IV SOLN
INTRAVENOUS | Status: DC | PRN
  Administered 2022-10-28: 4 ug via INTRAVENOUS
  Administered 2022-10-28: 8 ug via INTRAVENOUS
  Administered 2022-10-28: 4 ug via INTRAVENOUS

## 2022-10-28 MED ORDER — HYDROMORPHONE HCL 2 MG/ML IJ SOLN
INTRAMUSCULAR | Status: AC
  Filled 2022-10-28: qty 1

## 2022-10-28 MED ORDER — ROCURONIUM BROMIDE 10 MG/ML (PF) SYRINGE
PREFILLED_SYRINGE | INTRAVENOUS | Status: DC | PRN
  Administered 2022-10-28 (×2): 10 mg via INTRAVENOUS
  Administered 2022-10-28: 30 mg via INTRAVENOUS

## 2022-10-28 MED ORDER — ONDANSETRON HCL 4 MG/2ML IJ SOLN
INTRAMUSCULAR | Status: AC
  Filled 2022-10-28: qty 2

## 2022-10-28 MED ORDER — FENTANYL CITRATE (PF) 250 MCG/5ML IJ SOLN
INTRAMUSCULAR | Status: AC
  Filled 2022-10-28: qty 5

## 2022-10-28 MED ORDER — HYDROMORPHONE HCL 1 MG/ML IJ SOLN
INTRAMUSCULAR | Status: DC | PRN
  Administered 2022-10-28 (×3): .5 mg via INTRAVENOUS

## 2022-10-28 MED ORDER — HYDROMORPHONE HCL 2 MG PO TABS: 2.0000 mg | ORAL_TABLET | Freq: Four times a day (QID) | ORAL | 0 refills | Status: AC | PRN

## 2022-10-28 MED ORDER — ROCURONIUM BROMIDE 10 MG/ML (PF) SYRINGE
PREFILLED_SYRINGE | INTRAVENOUS | Status: AC
  Filled 2022-10-28: qty 10

## 2022-10-28 MED ORDER — KETOROLAC TROMETHAMINE 30 MG/ML IJ SOLN: 30.0000 mg | Freq: Once | INTRAMUSCULAR | Status: DC | PRN

## 2022-10-28 MED ORDER — BUPIVACAINE LIPOSOME 1.3 % IJ SUSP
INTRAMUSCULAR | Status: DC | PRN
  Administered 2022-10-28: 30 mL

## 2022-10-28 MED ORDER — LIDOCAINE 2% (20 MG/ML) 5 ML SYRINGE
INTRAMUSCULAR | Status: DC | PRN
  Administered 2022-10-28: 100 mg via INTRAVENOUS

## 2022-10-28 MED ORDER — DEXMEDETOMIDINE HCL IN NACL 80 MCG/20ML IV SOLN
INTRAVENOUS | Status: AC
  Filled 2022-10-28: qty 20

## 2022-10-28 MED ORDER — CHLORHEXIDINE GLUCONATE 0.12 % MT SOLN
15.0000 mL | Freq: Once | OROMUCOSAL | Status: AC
  Administered 2022-10-28: 15 mL via OROMUCOSAL

## 2022-10-28 MED ORDER — HYDROMORPHONE HCL 1 MG/ML IJ SOLN
INTRAMUSCULAR | Status: AC
  Filled 2022-10-28: qty 1

## 2022-10-28 MED ORDER — HYDROMORPHONE HCL 1 MG/ML IJ SOLN
0.2500 mg | INTRAMUSCULAR | Status: DC | PRN
  Administered 2022-10-28: 0.5 mg via INTRAVENOUS

## 2022-10-28 MED ORDER — ORAL CARE MOUTH RINSE: 15.0000 mL | Freq: Once | OROMUCOSAL | Status: AC

## 2022-10-28 MED ORDER — DROPERIDOL 2.5 MG/ML IJ SOLN
INTRAMUSCULAR | Status: DC | PRN
  Administered 2022-10-28: .625 mg via INTRAVENOUS

## 2022-10-28 MED ORDER — LACTATED RINGERS IR SOLN
Status: DC | PRN
  Administered 2022-10-28: 1000 mL

## 2022-10-28 MED ORDER — BUPIVACAINE LIPOSOME 1.3 % IJ SUSP
INTRAMUSCULAR | Status: AC
  Filled 2022-10-28: qty 20

## 2022-10-28 MED ORDER — DEXAMETHASONE SODIUM PHOSPHATE 10 MG/ML IJ SOLN
INTRAMUSCULAR | Status: AC
  Filled 2022-10-28: qty 1

## 2022-10-28 MED ORDER — PROPOFOL 10 MG/ML IV BOLUS
INTRAVENOUS | Status: DC | PRN
  Administered 2022-10-28: 200 mg via INTRAVENOUS

## 2022-10-28 MED ORDER — BUPIVACAINE LIPOSOME 1.3 % IJ SUSP: 20.0000 mL | Freq: Once | INTRAMUSCULAR | Status: DC

## 2022-10-28 MED ORDER — SUCCINYLCHOLINE CHLORIDE 200 MG/10ML IV SOSY
PREFILLED_SYRINGE | INTRAVENOUS | Status: DC | PRN
  Administered 2022-10-28: 150 mg via INTRAVENOUS

## 2022-10-28 MED ORDER — CEFAZOLIN SODIUM-DEXTROSE 2-4 GM/100ML-% IV SOLN
2.0000 g | INTRAVENOUS | Status: AC
  Administered 2022-10-28: 2 g via INTRAVENOUS
  Filled 2022-10-28: qty 100

## 2022-10-28 MED ORDER — SODIUM CHLORIDE (PF) 0.9 % IJ SOLN
INTRAMUSCULAR | Status: AC
  Filled 2022-10-28: qty 10

## 2022-10-28 MED ORDER — FENTANYL CITRATE (PF) 250 MCG/5ML IJ SOLN
INTRAMUSCULAR | Status: DC | PRN
  Administered 2022-10-28: 50 ug via INTRAVENOUS

## 2022-10-28 MED ORDER — ACETAMINOPHEN 500 MG PO TABS
1000.0000 mg | ORAL_TABLET | ORAL | Status: AC
  Administered 2022-10-28: 1000 mg via ORAL
  Filled 2022-10-28: qty 2

## 2022-10-28 MED ORDER — ONDANSETRON HCL 4 MG/2ML IJ SOLN: 4.0000 mg | Freq: Once | INTRAMUSCULAR | Status: DC | PRN

## 2022-10-28 MED ORDER — LACTATED RINGERS IV SOLN: INTRAVENOUS | Status: DC

## 2022-10-28 MED ORDER — PROPOFOL 10 MG/ML IV BOLUS
INTRAVENOUS | Status: AC
  Filled 2022-10-28: qty 20

## 2022-10-28 MED ORDER — SUGAMMADEX SODIUM 200 MG/2ML IV SOLN
INTRAVENOUS | Status: DC | PRN
  Administered 2022-10-28 (×4): 50 mg via INTRAVENOUS

## 2022-10-28 MED ORDER — SPY AGENT GREEN - (INDOCYANINE FOR INJECTION)
1.2500 mg | Freq: Once | INTRAMUSCULAR | Status: AC
  Administered 2022-10-28: 1.25 mg via INTRAVENOUS
  Filled 2022-10-28: qty 10

## 2022-10-28 MED ORDER — IOHEXOL 300 MG/ML  SOLN
INTRAMUSCULAR | Status: DC | PRN
  Administered 2022-10-28: 50 mL

## 2022-10-28 SURGICAL SUPPLY — 51 items
ADH SKN CLS APL DERMABOND .7 (GAUZE/BANDAGES/DRESSINGS) ×1
ADH SKN CLS LQ APL DERMABOND (GAUZE/BANDAGES/DRESSINGS) ×1
APL SWBSTK 6 STRL LF DISP (MISCELLANEOUS) ×2
APPLICATOR COTTON TIP 6 STRL (MISCELLANEOUS) ×4 IMPLANT
APPLICATOR COTTON TIP 6IN STRL (MISCELLANEOUS) ×2
APPLIER CLIP 5 13 M/L LIGAMAX5 (MISCELLANEOUS)
APPLIER CLIP ROT 10 11.4 M/L (STAPLE) ×1
APR CLP MED LRG 11.4X10 (STAPLE) ×1
APR CLP MED LRG 5 ANG JAW (MISCELLANEOUS)
BAG COUNTER SPONGE SURGICOUNT (BAG) IMPLANT
BAG SPEC RTRVL 10 TROC 200 (ENDOMECHANICALS) ×1
BAG SPNG CNTER NS LX DISP (BAG)
CABLE HIGH FREQUENCY MONO STRZ (ELECTRODE) IMPLANT
CATH REDDICK CHOLANGI 4FR 50CM (CATHETERS) ×2 IMPLANT
CLIP APPLIE 5 13 M/L LIGAMAX5 (MISCELLANEOUS) IMPLANT
CLIP APPLIE ROT 10 11.4 M/L (STAPLE) IMPLANT
COVER MAYO STAND XLG (MISCELLANEOUS) ×2 IMPLANT
COVER SURGICAL LIGHT HANDLE (MISCELLANEOUS) ×2 IMPLANT
DERMABOND ADVANCED .7 DNX12 (GAUZE/BANDAGES/DRESSINGS) ×2 IMPLANT
DERMABOND ADVANCED .7 DNX6 (GAUZE/BANDAGES/DRESSINGS) IMPLANT
DRAPE C-ARM 42X120 X-RAY (DRAPES) ×2 IMPLANT
ELECT L-HOOK LAP 45CM DISP (ELECTROSURGICAL) ×1
ELECT PENCIL ROCKER SW 15FT (MISCELLANEOUS) ×2 IMPLANT
ELECT REM PT RETURN 15FT ADLT (MISCELLANEOUS) ×2 IMPLANT
ELECTRODE L-HOOK LAP 45CM DISP (ELECTROSURGICAL) ×2 IMPLANT
GLOVE SURG LX STRL 8.0 MICRO (GLOVE) ×2 IMPLANT
GOWN STRL REUS W/ TWL XL LVL3 (GOWN DISPOSABLE) ×2 IMPLANT
GOWN STRL REUS W/TWL XL LVL3 (GOWN DISPOSABLE) ×1
GRASPER SUT TROCAR 14GX15 (MISCELLANEOUS) IMPLANT
HEMOSTAT SNOW SURGICEL 2X4 (HEMOSTASIS) IMPLANT
HEMOSTAT SURGICEL 4X8 (HEMOSTASIS) IMPLANT
IRRIG SUCT STRYKERFLOW 2 WTIP (MISCELLANEOUS) ×1
IRRIGATION SUCT STRKRFLW 2 WTP (MISCELLANEOUS) ×2 IMPLANT
IV CATH 14GX2 1/4 (CATHETERS) ×2 IMPLANT
KIT BASIN OR (CUSTOM PROCEDURE TRAY) ×2 IMPLANT
KIT TURNOVER KIT A (KITS) IMPLANT
POUCH RETRIEVAL ECOSAC 10 (ENDOMECHANICALS) IMPLANT
SCISSORS LAP 5X45 EPIX DISP (ENDOMECHANICALS) ×2 IMPLANT
SET TUBE SMOKE EVAC HIGH FLOW (TUBING) ×2 IMPLANT
SLEEVE ADV FIXATION 5X100MM (TROCAR) ×2 IMPLANT
SPIKE FLUID TRANSFER (MISCELLANEOUS) ×2 IMPLANT
SUT MNCRL AB 4-0 PS2 18 (SUTURE) ×4 IMPLANT
SYR 20ML LL LF (SYRINGE) ×2 IMPLANT
SYS BAG RETRIEVAL 10MM (BASKET)
SYSTEM BAG RETRIEVAL 10MM (BASKET) IMPLANT
TOWEL OR 17X26 10 PK STRL BLUE (TOWEL DISPOSABLE) ×2 IMPLANT
TRAY LAPAROSCOPIC (CUSTOM PROCEDURE TRAY) ×2 IMPLANT
TROCAR 11X100 Z THREAD (TROCAR) IMPLANT
TROCAR ADV FIXATION 5X100MM (TROCAR) ×2 IMPLANT
TROCAR BALLN 12MMX100 BLUNT (TROCAR) IMPLANT
TROCAR XCEL NON-BLD 5MMX100MML (ENDOMECHANICALS) IMPLANT

## 2022-10-28 NOTE — Anesthesia Postprocedure Evaluation (Signed)
Anesthesia Post Note  Patient: Troy Banks  Procedure(s) Performed: LAPAROSCOPIC CHOLECYSTECTOMY WITH INTRAOPERATIVE CHOLANGIOGRAM     Patient location during evaluation: PACU Anesthesia Type: General Level of consciousness: awake and alert Pain management: pain level controlled Vital Signs Assessment: post-procedure vital signs reviewed and stable Respiratory status: spontaneous breathing, nonlabored ventilation, respiratory function stable and patient connected to nasal cannula oxygen Cardiovascular status: blood pressure returned to baseline and stable Postop Assessment: no apparent nausea or vomiting Anesthetic complications: no  No notable events documented.  Last Vitals:  Vitals:   10/28/22 1430 10/28/22 1445  BP: 137/66 (!) 143/65  Pulse: (!) 56 (!) 54  Resp: 16 16  Temp:    SpO2: 91% 92%    Last Pain:  Vitals:   10/28/22 1445  TempSrc:   PainSc: Asleep                 Sarita Hakanson S

## 2022-10-28 NOTE — H&P (Signed)
REFERRING PHYSICIAN: Corwin Levins, MD  PROVIDER: Katha Cabal, MD  MRN: [id number] DOB: March 29, 1941  Chief Complaint: cholecystitis   History of Present Illness: Troy Banks is a 82 y.o. male who is seen today as an office consultation at the request of Dr. Jonny Ruiz for evaluation of gallstones. .  This is a 82 year old white male who was seen in the [facility] ER last night for right-sided abdominal pain. It was thought to be kidney stones because he has a kidney history of kidney stones but on a gallbladder ultrasound he does have a 1 cm gallstone and then other stones as well. There is no gallbladder wall thickening noted on that study. He also had a renal cyst CT scan which did not show any other abnormalities other than the gallstones.  On questioning he has been having pain for about a month. He is somewhat mildly obese white male who carries a diagnosis of diminished to see cytochrome P4 50 system enzymes as well as Agent Orange exposure. Many medicines will have prolonged metabolism in his body. The only pain med he seems to be able to take his oral Dilaudid although he does have a fairly high tolerance for pain.  The right upper quadrant abdominal pain sort of been persistent and at times severe. Although there is no evidence of subacute or chronic cholecystitis his habitus would lead me to believe otherwise he may well have a walled off gallbladder with gallstones.  I discussed laparoscopic cholecystectomy with him. His wife retired from [employer] as a Engineer, civil (consulting) all throughout the hospital until her retirement in 2008.  Review of Systems: See HPI as well for other ROS.  ROS  Medical History: Past Medical History: Diagnosis Date Arthritis CHF (congestive heart failure) (CMS/HHS-HCC) Diabetes mellitus without complication (CMS/HHS-HCC) Heart valve disease Hyperlipidemia Hypertension Sleep apnea Thyroid disease  Patient Active Problem List Diagnosis Abdominal wall  hernia Abscess of right buttock Acute gouty arthritis Acute sinus infection Age-related nuclear cataract, bilateral Allergic rhinitis Ankle pain Greater trochanteric bursitis of right hip Headache Musculoskeletal pain Aortic atherosclerosis (CMS-HCC) At risk for adverse drug reaction Atrial flutter (CMS/HHS-HCC) Bladder neck obstruction Chronic diastolic CHF (congestive heart failure) (CMS/HHS-HCC) Closed fracture dislocation of ankle joint, left, with delayed healing, subsequent encounter Closed fracture of ankle Coronary artery disease involving native coronary artery with angina pectoris (CMS-HCC) Cough Cysts of right upper eyelid Depression Diet-controlled diabetes mellitus (CMS/HHS-HCC) Diverticulosis of colon DOE (dyspnea on exertion) Dyslipidemia Encounter for fitting and adjustment of hearing aid Encounter for other general examination Encounter for disability determination DNR (do not resuscitate) discussion Encounter for fitting and adjustment of spectacles and contact lenses Encounter for well adult exam with abnormal findings Special screening for malignant neoplasms, colon Epididymitis Essential hypertension Essential tremor Exposure to potentially hazardous substance External hemorrhoid, thrombosed GERD (gastroesophageal reflux disease) Gross hematuria H/O non-ST elevation myocardial infarction (NSTEMI) Hallux rigidus History of lacunar cerebrovascular accident Hypothyroidism Internal hemorrhoids Intractable chronic migraine without aura and without status migrainosus Lumbar back pain Obstructive sleep apnea (adult) (pediatric) OSA on CPAP Other specified disorders of eyelid PAF (paroxysmal atrial fibrillation) (CMS/HHS-HCC) Pain of left scapula Personal history of colonic polyps Personal history of urinary calculi 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 (CMS/HHS-HCC) Chronic chest pain Progressive angina (CMS/HHS-HCC) Unstable  angina (CMS/HHS-HCC) Renal stone Retinoschisis Right inguinal hernia RUQ pain H/O prosthetic aortic valve replacement S/P CABG x 4 Sensorineural hearing loss, bilateral Sinus bradycardia Skin lesion Spondylolisthesis at L4-L5 level Status post total replacement of  left hip Osteoarthritis of right hip Unilateral primary osteoarthritis, left hip Vitamin D deficiency 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 Meloxicam Other (See Comments) and Unknown Accumulates and causes stroke-like side-effects due to cytochrome P450 enzyme deficiency Ondansetron Other (See Comments) Accumulates and causes stroke-like side-effects due to cytochrome P450 enzyme deficiency Oxycodone Other (See Comments) Accumulates and causes stroke-like side-effects due to cytochrome P450 enzyme deficiency Promethazine Other (See Comments) Accumulates and causes stroke-like side-effects due to cytochrome P450 enzyme deficiency Sertraline Other (See Comments) Accumulates and causes stroke-like side-effects due to cytochrome P450 enzyme deficiency Tamsulosin Other (See Comments) Accumulates and causes stroke-like side-effects due to cytochrome P450 enzyme deficiency Tramadol Other (See Comments) Accumulates and causes stroke-like side-effects due to cytochrome P450 enzyme  deficiency Atorvastatin Other (See Comments) MYALGIAS Colchicine Other (See Comments) ataxia Benzodiazepines Other (See Comments) Fentanyl Other (See Comments) Accumulates and cause stroke like symptoms Prochlorperazine Other (See Comments)  Current Outpatient Medications on File Prior to Visit Medication Sig Dispense Refill allopurinoL (ZYLOPRIM) 300 MG tablet Take 1 tablet by mouth once daily ascorbic acid, vitamin C, (VITAMIN C) 1000 MG tablet Take 1,000 mg by mouth once daily aspirin 81 MG EC tablet Take by mouth cholecalciferol (VITAMIN D3) 1000 unit capsule Take by mouth cyanocobalamin (VITAMIN B12) 1000 MCG tablet Take by mouth dilTIAZem (CARDIZEM CD) 240 MG CD capsule Take 1 capsule by mouth once daily ELIQUIS 5 mg tablet Take 5 mg by mouth 2 (two) times daily enoxaparin (LOVENOX) 150 mg/mL injection syringe Inject subcutaneously famotidine (PEPCID) 40 MG tablet Take 40 mg by mouth once daily FUROsemide (LASIX) 40 MG tablet Take 1 tablet by mouth every other day isosorbide mononitrate (IMDUR) 60 MG ER tablet Take 60 mg by mouth once daily levothyroxine (SYNTHROID) 25 MCG tablet Take 1 tablet by mouth daily with breakfast nitroGLYcerin (NITROSTAT) 0.4 MG SL tablet Place 1 tablet under the tongue every 5 (five) minutes as needed rosuvastatin (CRESTOR) 10 MG tablet Take 0.5 tablets by mouth once daily turmeric/turmeric ext/pepr ext (TURMERIC-TURMERIC EXT-PEPPER) 500-3 mg Cap Take 500 mg by mouth once daily  No current facility-administered medications on file prior to visit.  Family History Problem Relation Age of Onset Hyperlipidemia (Elevated cholesterol) Mother High blood pressure (Hypertension) Mother Coronary Artery Disease (Blocked arteries around heart) Mother High blood pressure (Hypertension) Father Coronary Artery Disease (Blocked arteries around heart) Father High blood pressure (Hypertension) Sister Coronary Artery Disease (Blocked arteries around heart)  Sister   Social History  Tobacco Use Smoking Status Former Types: Cigarettes Smokeless Tobacco Never   Social History  Socioeconomic History Marital status: Married Tobacco Use Smoking status: Former Types: Cigarettes Smokeless tobacco: Never Substance and Sexual Activity Alcohol use: Yes Drug use: Never  Social Determinants of Health  Financial Resource Strain: Low Risk (09/01/2022) Received from [facility] Overall Financial Resource Strain (CARDIA) Difficulty of Paying Living Expenses: Not hard at all Food Insecurity: No Food Insecurity (09/20/2022) Received from [facility] Hunger Vital Sign Worried About Running Out of Food in the Last Year: Never true Ran Out  of Food in the Last Year: Never true Transportation Needs: No Transportation Needs (09/20/2022) Received from [facility] - Transportation Lack of Transportation (Medical): No Lack of Transportation (Non-Medical): No Physical Activity: Inactive (09/01/2022) Received from [facility] Exercise Vital Sign Days of Exercise per Week: 0 days Minutes of Exercise per Session: 0 min Stress: Stress Concern Present (09/01/2022) Received from [facility] - Occupational Stress Questionnaire Feeling of Stress : To some extent Social Connections: Unknown (09/01/2022) Received from [facility] Social Connection and Isolation Panel [NHANES] Frequency of Communication with Friends and Family: More than three times a week Frequency of Social Gatherings with Friends and Family: Twice a week Active Member of [employer] or Organizations: Yes Attends Engineer, structural: More than 4 times per year Marital Status: Married  Objective:  Vitals: BP: 128/72 Pulse: 98 Temp: 36.7 C (98.1 F) SpO2: 98% Weight: 92.3 kg (203 lb 6.4 oz) PainSc: 0-No pain  There is no height or weight on file to calculate BMI.  Physical Exam General: Pleasant slightly overweight white male  no acute distress HEENT : Unremarkable Chest: Clear to auscultation Heart: Heart sinus rhythm. He has been diagnosed with atrial fibrillation for which she is on Eliquis. They are going to schedule him for a minute procedure for his atrial appendage Breast: Not examined Abdomen: Protuberant and mildly tender in the right upper quadrant. No rebound or guarding GU not examined Rectal not performed Extremities range of motion okay although sometimes he is not steady. Neuro alert and oriented x 3. Whenever he is overmedicated he does behave like he has had a stroke.  Labs, Imaging and Diagnostic Testing: I reviewed his ultrasound report from last night and also renal CT from last night.  Assessment and Plan:   Chronic cholecystitis-lap chole    He appears to have a deficiency of cytochrome P4 50 oxidase system positive medications to have slow metabolism. On ultrasound he has a gallstone which we will plan to rid him of with his laparoscopic cholecystectomy. I discussed the procedure with him and we will try to schedule at as soon as he can get off his Eliquis    Kitrina Maurin Charna Busman, MD

## 2022-10-28 NOTE — Anesthesia Procedure Notes (Signed)
Procedure Name: Intubation Date/Time: 10/28/2022 12:23 PM  Performed by: Lovie Chol, CRNAPre-anesthesia Checklist: Patient identified, Emergency Drugs available, Suction available and Patient being monitored Patient Re-evaluated:Patient Re-evaluated prior to induction Oxygen Delivery Method: Circle System Utilized Preoxygenation: Pre-oxygenation with 100% oxygen Induction Type: IV induction Ventilation: Mask ventilation without difficulty Laryngoscope Size: Glidescope and 4 Grade View: Grade I Tube type: Oral Tube size: 7.5 mm Number of attempts: 1 Airway Equipment and Method: Stylet and Oral airway Placement Confirmation: ETT inserted through vocal cords under direct vision, positive ETCO2 and breath sounds checked- equal and bilateral Secured at: 22 cm Tube secured with: Tape Dental Injury: Teeth and Oropharynx as per pre-operative assessment

## 2022-10-28 NOTE — Transfer of Care (Signed)
Immediate Anesthesia Transfer of Care Note  Patient: Troy Banks  Procedure(s) Performed: LAPAROSCOPIC CHOLECYSTECTOMY WITH INTRAOPERATIVE CHOLANGIOGRAM  Patient Location: PACU  Anesthesia Type:General  Level of Consciousness: oriented, sedated, and patient cooperative  Airway & Oxygen Therapy: Patient Spontanous Breathing and Patient connected to face mask oxygen  Post-op Assessment: Report given to RN and Post -op Vital signs reviewed and stable  Post vital signs: Reviewed  Last Vitals:  Vitals Value Taken Time  BP 148/62 10/28/22 1345  Temp    Pulse 57 10/28/22 1348  Resp 21 10/28/22 1348  SpO2 94 % 10/28/22 1348  Vitals shown include unvalidated device data.  Last Pain:  Vitals:   10/28/22 0957  TempSrc:   PainSc: 0-No pain      Patients Stated Pain Goal: 5 (10/28/22 0957)  Complications: No notable events documented.

## 2022-10-28 NOTE — Op Note (Signed)
[name]  Primary Care Physician:  Corwin Levins, MD    10/28/2022  1:43 PM  Procedure: Laparoscopic Cholecystectomy with intraoperative cholangiogram  Surgeon: Susy Frizzle B. Daphine Deutscher, MD, FACS Asst:  Phylliss Blakes, MD, FACS  Anes:  General  Drains:  None  Findings: Chronic cholecystitis with multiple anthracotic pigment stones  Description of Procedure: The patient was taken to OR 1 and given general anesthesia.  The patient was prepped with chlorohexidine prep and draped sterilely. A time out was performed including identifying the patient and discussing their procedure.  Access to the abdomen was achieved with a 5 mm Optiview through the right upper quadrant.  Port placement included three 5 mm trocars and one 11 mm trocar.    The gallbladder was visualized and appeared behind an omentum that was stuck medially to the liver.  The gallbladder was not adherent.   The fundus of the gallbaldder was grasped and the gallbladder was elevated.  It was thin and drained when grasped. Traction on the infundibulum allowed for successful demonstration of the critical view. Inflammatory changes were chronic.  The cystic duct was medial and was double clipped and divided.  Indocyanine green was minimally useful in identifying the CBD.   The cystic duct was identified and clipped up on the gallbladder and an incision was made in the cystic duct and the Reddick catheter was inserted after milking the cystic duct of any debris. A dynamic cholangiogram was performed which demonstrated a small CBD with intrahepatic filling and free flow into the duodenum.    The cystic duct was then triple clipped and divided, the cystic artery was double clipped and divided and then the gallbladder was removed from the gallbladder bed. Removal of the gallbladder from the gallbladder bed was performed with an opening.  The 4 mm black stones were aspirated by the suction.  The gallbladder was then placed in a bag and brought  out through one of the trocar sites. The gallbladder bed was inspected --cauterized and snow placed.  No bleeding or bile leaks were seen.   Laparoscopic visualization was used when closing fascial defects for trocar sites-0 vicryl was used.  .   Incisions were injected with Exparel and closed with 4-0 Monocryl and Dermabond on the skin.  Sponge and needle count were correct.    The patient was taken to the recovery room in satisfactory condition.

## 2022-10-29 ENCOUNTER — Encounter (HOSPITAL_COMMUNITY): Payer: Self-pay | Admitting: Surgery

## 2022-11-01 LAB — SURGICAL PATHOLOGY

## 2022-11-03 ENCOUNTER — Ambulatory Visit: Payer: Medicare Other | Admitting: Nurse Practitioner

## 2022-11-04 ENCOUNTER — Ambulatory Visit (INDEPENDENT_AMBULATORY_CARE_PROVIDER_SITE_OTHER): Payer: Medicare Other | Admitting: Family Medicine

## 2022-11-04 ENCOUNTER — Emergency Department (HOSPITAL_COMMUNITY)
Admission: EM | Admit: 2022-11-04 | Discharge: 2022-11-04 | Disposition: A | Discharge status: Home / Self Care | Payer: Medicare Other | Attending: Emergency Medicine | Admitting: Emergency Medicine

## 2022-11-04 ENCOUNTER — Other Ambulatory Visit: Payer: Self-pay

## 2022-11-04 ENCOUNTER — Encounter: Payer: Self-pay | Admitting: Family Medicine

## 2022-11-04 ENCOUNTER — Emergency Department (HOSPITAL_COMMUNITY): Payer: Medicare Other

## 2022-11-04 ENCOUNTER — Encounter (HOSPITAL_COMMUNITY): Payer: Self-pay

## 2022-11-04 VITALS — BP 148/76 | HR 60 | Temp 97.6°F | Ht 68.5 in | Wt 200.0 lb

## 2022-11-04 DIAGNOSIS — R29898 Other symptoms and signs involving the musculoskeletal system: Secondary | ICD-10-CM

## 2022-11-04 DIAGNOSIS — N200 Calculus of kidney: Secondary | ICD-10-CM | POA: Diagnosis not present

## 2022-11-04 DIAGNOSIS — M545 Low back pain, unspecified: Secondary | ICD-10-CM

## 2022-11-04 DIAGNOSIS — Z7982 Long term (current) use of aspirin: Secondary | ICD-10-CM | POA: Insufficient documentation

## 2022-11-04 DIAGNOSIS — E119 Type 2 diabetes mellitus without complications: Secondary | ICD-10-CM | POA: Insufficient documentation

## 2022-11-04 DIAGNOSIS — R1032 Left lower quadrant pain: Secondary | ICD-10-CM | POA: Insufficient documentation

## 2022-11-04 DIAGNOSIS — Z79899 Other long term (current) drug therapy: Secondary | ICD-10-CM | POA: Diagnosis not present

## 2022-11-04 DIAGNOSIS — R262 Difficulty in walking, not elsewhere classified: Secondary | ICD-10-CM | POA: Diagnosis not present

## 2022-11-04 DIAGNOSIS — Z87442 Personal history of urinary calculi: Secondary | ICD-10-CM | POA: Diagnosis not present

## 2022-11-04 DIAGNOSIS — I10 Essential (primary) hypertension: Secondary | ICD-10-CM | POA: Insufficient documentation

## 2022-11-04 DIAGNOSIS — M549 Dorsalgia, unspecified: Secondary | ICD-10-CM | POA: Diagnosis not present

## 2022-11-04 DIAGNOSIS — M5442 Lumbago with sciatica, left side: Secondary | ICD-10-CM | POA: Diagnosis not present

## 2022-11-04 DIAGNOSIS — R319 Hematuria, unspecified: Secondary | ICD-10-CM

## 2022-11-04 DIAGNOSIS — K5730 Diverticulosis of large intestine without perforation or abscess without bleeding: Secondary | ICD-10-CM | POA: Diagnosis not present

## 2022-11-04 DIAGNOSIS — Z7901 Long term (current) use of anticoagulants: Secondary | ICD-10-CM | POA: Diagnosis not present

## 2022-11-04 DIAGNOSIS — Z743 Need for continuous supervision: Secondary | ICD-10-CM | POA: Diagnosis not present

## 2022-11-04 LAB — CBC WITH DIFFERENTIAL/PLATELET
Abs Immature Granulocytes: 0.04 10*3/uL (ref 0.00–0.07)
Basophils Absolute: 0.1 10*3/uL (ref 0.0–0.1)
Basophils Relative: 1 %
Eosinophils Absolute: 0.1 10*3/uL (ref 0.0–0.5)
Eosinophils Relative: 2 %
HCT: 44.8 % (ref 39.0–52.0)
Hemoglobin: 15 g/dL (ref 13.0–17.0)
Immature Granulocytes: 1 %
Lymphocytes Relative: 30 %
Lymphs Abs: 2.1 10*3/uL (ref 0.7–4.0)
MCH: 31.1 pg (ref 26.0–34.0)
MCHC: 33.5 g/dL (ref 30.0–36.0)
MCV: 92.8 fL (ref 80.0–100.0)
Monocytes Absolute: 0.7 10*3/uL (ref 0.1–1.0)
Monocytes Relative: 10 %
Neutro Abs: 4.1 10*3/uL (ref 1.7–7.7)
Neutrophils Relative %: 56 %
Platelets: 166 10*3/uL (ref 150–400)
RBC: 4.83 MIL/uL (ref 4.22–5.81)
RDW: 14.2 % (ref 11.5–15.5)
WBC: 7.1 10*3/uL (ref 4.0–10.5)
nRBC: 0 % (ref 0.0–0.2)

## 2022-11-04 LAB — COMPREHENSIVE METABOLIC PANEL
ALT: 24 U/L (ref 0–44)
AST: 27 U/L (ref 15–41)
Albumin: 3.8 g/dL (ref 3.5–5.0)
Alkaline Phosphatase: 57 U/L (ref 38–126)
Anion gap: 9 (ref 5–15)
BUN: 12 mg/dL (ref 8–23)
CO2: 24 mmol/L (ref 22–32)
Calcium: 8.8 mg/dL — ABNORMAL LOW (ref 8.9–10.3)
Chloride: 106 mmol/L (ref 98–111)
Creatinine, Ser: 0.89 mg/dL (ref 0.61–1.24)
GFR, Estimated: >60 mL/min (ref >60)
Glucose, Bld: 147 mg/dL — ABNORMAL HIGH (ref 70–99)
Potassium: 4.3 mmol/L (ref 3.5–5.1)
Sodium: 139 mmol/L (ref 135–145)
Total Bilirubin: 1.8 mg/dL — ABNORMAL HIGH (ref 0.3–1.2)
Total Protein: 6.7 g/dL (ref 6.5–8.1)

## 2022-11-04 LAB — POC URINALSYSI DIPSTICK (AUTOMATED)
Bilirubin, UA: NEGATIVE
Blood, UA: POSITIVE
Glucose, UA: NEGATIVE
Ketones, UA: NEGATIVE
Leukocytes, UA: NEGATIVE
Nitrite, UA: NEGATIVE
Protein, UA: POSITIVE — AB
Spec Grav, UA: 1.015 (ref 1.010–1.025)
Urobilinogen, UA: 0.2 E.U./dL
pH, UA: 6 (ref 5.0–8.0)

## 2022-11-04 LAB — URINALYSIS, ROUTINE W REFLEX MICROSCOPIC
Bilirubin Urine: NEGATIVE
Glucose, UA: NEGATIVE mg/dL
Ketones, ur: NEGATIVE mg/dL
Leukocytes,Ua: NEGATIVE
Nitrite: NEGATIVE
Protein, ur: NEGATIVE mg/dL
Specific Gravity, Urine: 1.013 (ref 1.005–1.030)
pH: 6 (ref 5.0–8.0)

## 2022-11-04 LAB — LIPASE, BLOOD: Lipase: 30 U/L (ref 11–51)

## 2022-11-04 MED ORDER — PREDNISONE 10 MG PO TABS: ORAL_TABLET | ORAL | 0 refills | Status: AC

## 2022-11-04 NOTE — ED Triage Notes (Signed)
BIBA 7 days post op gallbladder surgery. Abdomen tender from post op. Patient endorses back pain radiating down to left leg. Upon arrival, MAE, respirations even and unlabored. NAD.  Upon assessment, AAOX4, HX of CYP450. Sent by MD from office due to patient is difficult to medicate and MD felt more comfortable to send pt to ED. Awaiting EDP

## 2022-11-04 NOTE — Discharge Instructions (Signed)
Take the steroids to see if that helps with your back pain.  Follow-up with your primary care doctor to be rechecked.  Consider seeing a spine doctor for further evaluation if the symptoms persist

## 2022-11-04 NOTE — ED Provider Notes (Signed)
[facility] Provider Note   CSN: [id number] Arrival date & time: 11/04/22  1050     History  Chief complaint: Abdominal and flank pain  Troy Banks is a 82 y.o. male.  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  Patient had a laparoscopic cholecystectomy on May 24.  Patient states he has been having some pain in his lower back.  Feels like it is radiating towards the front of his abdomen.  Patient went to his doctor's office this morning.  He was having significant low back pain they noted blood in his urine he was sent to the ED for further evaluation  Home Medications Prior to Admission medications   Medication Sig Start Date End Date Taking? Authorizing Provider  predniSONE (DELTASONE) 10 MG tablet Take 5 tablets (50 mg total) by mouth daily with breakfast for 2 days, THEN 4 tablets (40 mg total) daily with breakfast for 2 days, THEN 3 tablets (30 mg total) daily with breakfast for 2 days, THEN 2 tablets (20 mg total) daily with breakfast for 2 days, THEN 1 tablet (10 mg total) daily with breakfast for 2 days. 11/04/22 11/13/22 Yes Linwood Dibbles, MD  allopurinol (ZYLOPRIM) 300 MG tablet TAKE 1 TABLET BY MOUTH EVERY DAY 05/02/22   Corwin Levins, MD  amLODipine (NORVASC) 10 MG tablet Take 10 mg by mouth daily.    [provider]  apixaban (ELIQUIS) 5 MG TABS tablet Take 1 tablet (5 mg total) by mouth 2 (two) times daily. Patient not taking: Reported on 11/04/2022 09/27/22   Carlos Levering, NP  Ascorbic Acid (VITAMIN C) 1000 MG tablet Take 1,000 mg by mouth daily.    [provider]  aspirin EC 81 MG tablet Take 81 mg by mouth every other day. Swallow whole.    [provider]  Carboxymethylcellulose Sodium (LUBRICANT EYE DROPS OP) Place 1 drop into  both eyes 2 (two) times daily as needed (dry/irritated eyes.).     [provider]  Cholecalciferol (VITAMIN D-3) 25 MCG (1000 UT) CAPS Take 1,000 Units by mouth every other day.    [provider]  diclofenac Sodium (VOLTAREN) 1 % GEL Apply 2 g topically in the morning and at bedtime.    [provider]  diltiazem (CARDIZEM CD) 240 MG 24 hr capsule Take 1 capsule (240 mg total) by mouth daily. 09/18/22   Rhetta Mura, MD  diltiazem (CARTIA XT) 240 MG 24 hr capsule Take 1 capsule (240 mg total) by mouth daily for 4 days. 09/17/22 11/04/22  Rhetta Mura, MD  famotidine (PEPCID) 40 MG tablet Take 1 tablet (40 mg total) by mouth daily. 06/28/22   Corwin Levins, MD  furosemide (LASIX) 40 MG tablet TAKE 1 TABLET BY MOUTH EVERY OTHER DAY 05/20/22   [place], [name], MD  isosorbide mononitrate (IMDUR) 60 MG 24 hr tablet Take 1 tablet (60 mg total) by mouth daily. 09/27/22   Carlos Levering, NP  levothyroxine (SYNTHROID) 25 MCG tablet TAKE 1 TABLET BY MOUTH EVERY MORNING WITH BREAKFAST 10/14/22   Corwin Levins, MD  nitroGLYCERIN (NITROSTAT) 0.4 MG SL tablet PLACE 1 TABLET UNDER THE TONGUE EVERY 5 MINUTES AS NEEDED FOR CHEST PAIN. 11/30/21   [place], [name], MD  NON FORMULARY Pt uses a cpap nightly  [provider]  rosuvastatin (CRESTOR) 10 MG tablet TAKE 1/2 TABLET BY MOUTH EVERY DAY 09/21/22   Rollene Rotunda, MD  tiZANidine (ZANAFLEX) 4 MG tablet Take 2 mg by mouth every 8 (eight) hours as needed for muscle spasms. 12/18/17   [provider]  Turmeric 500 MG CAPS Take 500 mg by mouth daily.     [provider]  vitamin B-12 (CYANOCOBALAMIN) 1000 MCG tablet Take 1,000 mcg by mouth every other day.    [provider]  ranitidine (ZANTAC) 150 MG tablet TAKE 1 TABLET BY MOUTH 2 TIMES DAILY Patient taking differently: Take 150 mg by mouth 2 (two) times a day. 09/05/18 12/13/18  Corwin Levins, MD      Allergies    Acyclovir and related,  Benzodiazepines, Compazine [prochlorperazine maleate], Coreg [carvedilol], Flomax [tamsulosin hcl], Lisinopril, Mobic [meloxicam], Morphine and codeine, Ondansetron, Oxycodone, Promethazine, Sertraline hcl, Tramadol, Atorvastatin, Colchicine, and Fentanyl    Review of Systems   Review of Systems  Physical Exam Updated Vital Signs BP 132/66   Pulse (!) 48   Temp 97.8 F (36.6 C) (Oral)   Resp 17   SpO2 93%  Physical Exam Vitals and nursing note reviewed.  Constitutional:      General: He is not in acute distress.    Appearance: He is well-developed.  HENT:     Head: Normocephalic and atraumatic.     Right Ear: External ear normal.     Left Ear: External ear normal.  Eyes:     General: No scleral icterus.       Right eye: No discharge.        Left eye: No discharge.     Conjunctiva/sclera: Conjunctivae normal.  Neck:     Trachea: No tracheal deviation.  Cardiovascular:     Rate and Rhythm: Normal rate and regular rhythm.  Pulmonary:     Effort: Pulmonary effort is normal. No respiratory distress.     Breath sounds: Normal breath sounds. No stridor. No wheezing or rales.  Abdominal:     General: Bowel sounds are normal. There is no distension.     Palpations: Abdomen is soft.     Tenderness: There is abdominal tenderness. There is no guarding or rebound.     Comments: Tenderness palpation left lower quadrant left flank area  Musculoskeletal:        General: No tenderness or deformity.     Cervical back: Neck supple.  Skin:    General: Skin is warm and dry.     Findings: No rash.  Neurological:     General: No focal deficit present.     Mental Status: He is alert.     Cranial Nerves: No cranial nerve deficit, dysarthria or facial asymmetry.     Sensory: No sensory deficit.     Motor: No abnormal muscle tone or seizure activity.     Coordination: Coordination normal.  Psychiatric:        Mood and Affect: Mood normal.     ED Results / Procedures / Treatments    Labs (all labs ordered are listed, but only abnormal results are displayed) Labs Reviewed  COMPREHENSIVE METABOLIC PANEL - Abnormal; Notable for the following components:      Result Value   Glucose, Bld 147 (*)    Calcium 8.8 (*)    Total Bilirubin 1.8 (*)    All other components within normal limits  URINALYSIS, ROUTINE W REFLEX MICROSCOPIC - Abnormal; Notable for the following components:  Hgb urine dipstick MODERATE (*)    Bacteria, UA RARE (*)    All other components within normal limits  LIPASE, BLOOD  CBC WITH DIFFERENTIAL/PLATELET    EKG None  Radiology CT Renal Stone Study  Result Date: 11/04/2022 CLINICAL DATA:  Postoperative abdominal pain. EXAM: CT ABDOMEN AND PELVIS WITHOUT CONTRAST TECHNIQUE: Multidetector CT imaging of the abdomen and pelvis was performed following the standard protocol without IV contrast. RADIATION DOSE REDUCTION: This exam was performed according to the departmental dose-optimization program which includes automated exposure control, adjustment of the mA and/or kV according to patient size and/or use of iterative reconstruction technique. COMPARISON:  Oct 13, 2022. FINDINGS: Lower chest: No acute abnormality. Hepatobiliary: No focal liver abnormality is seen. Status post cholecystectomy with expected postoperative finding. No biliary dilatation. Pancreas: Unremarkable. No pancreatic ductal dilatation or surrounding inflammatory changes. Spleen: Normal in size without focal abnormality. Adrenals/Urinary Tract: Stable calcifications in right adrenal gland consistent with prior hemorrhage or infection. Left adrenal gland is unremarkable. Bilateral nonobstructive nephrolithiasis is noted. No hydronephrosis or renal obstruction is noted. Urinary bladder is unremarkable. Stomach/Bowel: The stomach is unremarkable. The appendix appears normal. There is no evidence of bowel obstruction or inflammation. Diverticulosis is noted throughout the colon without  inflammation. Vascular/Lymphatic: Aortic atherosclerosis. No enlarged abdominal or pelvic lymph nodes. Reproductive: Prostate gland not well visualized due to scatter artifact arising from bilateral hip prostheses. Other: No abdominal wall hernia or abnormality. No abdominopelvic ascites. Musculoskeletal: Status post bilateral total hip arthroplasties. Old L2 compression fracture is noted. No acute osseous abnormality is noted. IMPRESSION: Bilateral nonobstructive nephrolithiasis. Diffuse colonic diverticulosis without inflammation. No acute abnormality seen in the abdomen or pelvis. Aortic Atherosclerosis (ICD10-I70.0). Electronically Signed   By: Lupita Raider M.D.   On: 11/04/2022 13:09    Procedures Procedures    Medications Ordered in ED Medications - No data to display  ED Course/ Medical Decision Making/ A&P Clinical Course as of 11/04/22 1513  Fri Nov 04, 2022  1212 CBC with Diff nl [JK]  1501 Urinalysis does show hematuria [JK]    Clinical Course User Index [JK] Linwood Dibbles, MD                             Medical Decision Making Differential diagnosis includes but not limited to ureteral colic, pyelonephritis, sciatica, colitis, diverticulitis  Problems Addressed: Acute left-sided low back pain with left-sided sciatica: acute illness or injury that poses a threat to life or bodily functions  Amount and/or Complexity of Data Reviewed Labs: ordered. Decision-making details documented in ED Course. Radiology: ordered.  Risk Prescription drug management.    patient presented with complaints of back pain as well as lower abdominal pain.  Patient's laboratory tests are reassuring.  No signs of severe dehydration.  No anemia.  No leukocytosis.  Urinalysis does show hematuria but not suggestive of infection.  CT scan does not show any evidence of ureteral stones.  No other acute process noted in the abdomen.  Patient has remained comfortable in the ED.  He has not required any  pain medications.  Discussed his findings with patient and his wife.  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  Will try a short course of steroids which she is tolerating the past.  I suspect his symptoms may be related to sciatic at this point.       Final Clinical Impression(s) / ED  Diagnoses Final diagnoses:  Acute left-sided low back pain with left-sided sciatica    Rx / DC Orders ED Discharge Orders          Ordered    predniSONE (DELTASONE) 10 MG tablet  Q breakfast        11/04/22 1509              Linwood Dibbles, MD 11/04/22 1515

## 2022-11-04 NOTE — Progress Notes (Signed)
Subjective:     Patient ID: Troy Banks, male    DOB: 10-27-1940, 82 y.o.   MRN: [id number]  Chief Complaint  Patient presents with   Back Pain    Pain started 4 days ago, getting progressively worse. Lower left back. Constant pain "like someone hit me in the back w a bat" reports he is getting spasms now due to the pain  Hx of kidney stones but states this is not that pain.    Back Pain Pertinent negatives include no abdominal pain, chest pain, dysuria or fever.   Patient is in today for left low back pain worsening, after getting out of bed 3-4 days ago.  Wife is with him, she is a Engineer, civil (consulting).   Left leg weakness, gait has been altered.   Any movement makes pain worse.   Gallbladder surgery last Friday.   Normally on Eliquis but has been off of it for recent surgery.      Health Maintenance Due  Topic Date Due   OPHTHALMOLOGY EXAM  08/24/2022    Past Medical History:  Diagnosis Date   Allergy    Anxiety    PTSD   Aortic stenosis, moderate 03/27/2015   post AVR echo Echo 11/16: Mild LVH, EF 55-60%, normal wall motion, grade 1 diastolic dysfunction, bioprosthetic AVR okay (mean gradient 18 mmHg) with trivial AI, mild LAE, atrial septal aneurysm, small effusion    Arthritis    "qwhere" (10/29/2015)   Cancer (HCC)    skin cancer   Carotid artery stenosis    Carotid US 11/17: 1-39% bilateral ICA stenosis. F/u prn   Cervical stenosis of spinal canal    fusion 2006   CHF (congestive heart failure) (HCC)    "secondry to OHS"   Closed left pilon fracture    COLONIC POLYPS, HX OF    Complication of anesthesia    "takes a lot to put him under and has woken up during surgery" Difficult to wake up . PTSD; "does not metabolize RX well"that he gets "violent", per pt.    Coronary artery disease    Depression    DIVERTICULOSIS, COLON    Dyspnea    Dysrhythmia    2 bouts of afib post op and at home but corrected with medication.   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)    confirmed heterozygous 10/24/14 labs   PTSD (post-traumatic stress disorder)    PTSD (post-traumatic stress disorder)    Sleep apnea    On a CPAP    Past Surgical History:  Procedure Laterality Date   ANKLE FUSION Left 03/17/2017   Procedure: FUSION  PILON FRACTURE WITH BONE GRAFTING;  Surgeon: Roby Lofts, MD;  Location: [facility] OR;  Service: Orthopedics;  Laterality: Left;  ANTERIOR FUSION CERVICAL SPINE  2002   AORTIC VALVE REPLACEMENT N/A 03/30/2015   Procedure: AORTIC VALVE REPLACEMENT (AVR);  Surgeon: Delight Ovens, MD;  Location: [facility] OR;  Service: Open Heart Surgery;  Laterality: N/A;   CARDIAC CATHETERIZATION N/A 03/26/2015   Procedure: Left Heart Cath and Coronary Angiography;  Surgeon: Marykay Lex, MD;  Location: [facility] INVASIVE CV LAB;  Service: Cardiovascular;  Laterality: N/A;   CARDIAC VALVE REPLACEMENT     CHOLECYSTECTOMY N/A 10/28/2022   Procedure: LAPAROSCOPIC CHOLECYSTECTOMY WITH INTRAOPERATIVE CHOLANGIOGRAM;  Surgeon: Luretha Murphy, MD;  Location: [facility] ORS;  Service: General;  Laterality: N/A;   COLONOSCOPY W/ POLYPECTOMY     CORONARY ARTERY BYPASS GRAFT N/A 03/30/2015   Procedure: CORONARY ARTERY BYPASS GRAFTING (CABG) x four, using left internal mammary artery and left    leg greater saphenous vein harvested endoscopically ;  Surgeon: Delight Ovens, MD;  Location: [facility] OR;  Service: Open Heart Surgery;   Laterality: N/A;   CORONARY/GRAFT ANGIOGRAPHY N/A 12/03/2021   Procedure: CORONARY/GRAFT ANGIOGRAPHY;  Surgeon: Swaziland, [name], MD;  Location: [facility] INVASIVE CV LAB;  Service: Cardiovascular;  Laterality: N/A;   CYSTOSCOPY/URETEROSCOPY/HOLMIUM LASER/STENT PLACEMENT Bilateral 11/07/2016   Procedure: CYSTOSCOPY/URETEROSCOPY/HOLMIUM LASER/STENT PLACEMENT;  Surgeon: Hildred Laser, MD;  Location: [facility] ORS;  Service: Urology;  Laterality: Bilateral;   CYSTOSCOPY/URETEROSCOPY/HOLMIUM LASER/STENT PLACEMENT Bilateral 11/23/2016   Procedure: CYSTOSCOPY/BILATERAL URETEROSCOPY/HOLMIUM LASER/STENT EXCHANGE;  Surgeon: Hildred Laser, MD;  Location: [facility] ORS;  Service: Urology;  Laterality: Bilateral;  NEEDS 90 MIN    EXTERNAL FIXATION LEG Left 02/22/2017   Procedure: EXTERNAL FIXATION LEFT ANKLE;  Surgeon: Sheral Apley, MD;  Location: [facility] OR;  Service: Orthopedics;  Laterality: Left;   HEMORRHOID BANDING  1970s   INGUINAL HERNIA REPAIR Left 1962   KIDNEY STONE SURGERY  1986   "cut me open"   LEFT HEART CATH AND CORS/GRAFTS ANGIOGRAPHY N/A 10/25/2018   Procedure: LEFT HEART CATH AND CORS/GRAFTS ANGIOGRAPHY;  Surgeon: Marykay Lex, MD;  Location: [facility] INVASIVE CV LAB;  Service: Cardiovascular;  Laterality: N/A;   LEFT HEART CATH AND CORS/GRAFTS ANGIOGRAPHY N/A 12/03/2021   Procedure: LEFT HEART CATH AND CORS/GRAFTS ANGIOGRAPHY;  Surgeon: Swaziland, [name], MD;  Location: [facility] INVASIVE CV LAB;  Service: Cardiovascular;  Laterality: N/A;   POSTERIOR FUSION CERVICAL SPINE  2006   TEE WITHOUT CARDIOVERSION N/A 03/30/2015   Procedure: TRANSESOPHAGEAL ECHOCARDIOGRAM (TEE);  Surgeon: Delight Ovens, MD;  Location: [facility] OR;  Service: Open Heart Surgery;  Laterality: N/A;   TESTICLE TORSION REDUCTION Right 1960   TOE FUSION Left 1985 & 2004   "great toe" and removal   TONSILLECTOMY     TOTAL HIP ARTHROPLASTY Right 10/27/2015   Procedure: RIGHT TOTAL HIP ARTHROPLASTY ANTERIOR APPROACH and steroid injection right  foot;  Surgeon: Kathryne Hitch, MD;  Location: [facility] OR;  Service: Orthopedics;  Laterality: Right;   TOTAL HIP ARTHROPLASTY Left 08/13/2019   TOTAL HIP ARTHROPLASTY Left 08/13/2019   Procedure: LEFT TOTAL HIP ARTHROPLASTY ANTERIOR APPROACH;  Surgeon: Kathryne Hitch, MD;  Location: [facility] OR;  Service: Orthopedics;  Laterality: Left;    Family History  Problem Relation Age of Onset   Heart disease Mother    Hypertension Mother    Diabetes Mother    Heart disease Father    Hypertension Father    Tremor Sister    Healthy Brother    Healthy Son    Prostate cancer Maternal Uncle    Hypertension Maternal Grandmother    Heart  disease Maternal Grandmother    Hypertension Maternal Grandfather    Heart disease Maternal Grandfather    Hypertension Paternal Grandfather    Heart disease Paternal Grandfather    Diabetes Paternal Grandfather    Alcohol abuse Other    Arthritis Other    Drug abuse Sister    Healthy Daughter    Colon polyps Neg Hx    Esophageal cancer Neg Hx    Rectal cancer Neg Hx    Stomach cancer Neg Hx    Colon cancer Neg Hx     Social History   Socioeconomic History   Marital status: Married    Spouse name: Danne Baxter, RN   Number of children: 2   Years of education: Not on file   Highest education level: Some college, no degree  Occupational History   Occupation: Retired Economist  Tobacco Use   Smoking status: Some Days    Packs/day: 1.00    Years: 34.00    Additional pack years: 0.00    Total pack years: 34.00    Types: Cigarettes    Last attempt to quit: 06/06/1990    Years since quitting: 32.4   Smokeless tobacco: Never  Vaping Use   Vaping Use: Never used  Substance and Sexual Activity   Alcohol use: Yes    Alcohol/week: 3.0 standard drinks of alcohol    Types: 1 Cans of beer, 2 Shots of liquor per week    Comment: occassional   Drug use: No   Sexual activity: Yes  Other Topics Concern   Not on file  Social History  Narrative   Left Handed    One Story home    Social Determinants of Health   Financial Resource Strain: Low Risk  (09/01/2022)   Overall Financial Resource Strain (CARDIA)    Difficulty of Paying Living Expenses: Not hard at all  Food Insecurity: No Food Insecurity (09/20/2022)   Hunger Vital Sign    Worried About Running Out of Food in the Last Year: Never true    Ran Out of Food in the Last Year: Never true  Transportation Needs: No Transportation Needs (09/20/2022)   PRAPARE - Administrator, Civil Service (Medical): No    Lack of Transportation (Non-Medical): No  Physical Activity: Inactive (09/01/2022)   Exercise Vital Sign    Days of Exercise per Week: 0 days    Minutes of Exercise per Session: 0 min  Stress: Stress Concern Present (09/01/2022)   [employer] of Occupational Health - Occupational Stress Questionnaire    Feeling of Stress : To some extent  Social Connections: Unknown (09/01/2022)   Social Connection and Isolation Panel [NHANES]    Frequency of Communication with Friends and Family: More than three times a week    Frequency of Social Gatherings with Friends and Family: Twice a week    Attends Religious Services: Not on Marketing executive or Organizations: Yes    Attends Banker Meetings: More than 4 times per year    Marital Status: Married  Catering manager Violence: Not At Risk (09/15/2022)   Humiliation, Afraid, Rape, and Kick questionnaire    Fear of Current or Ex-Partner: No    Emotionally Abused: No    Physically Abused: No    Sexually Abused: No    Outpatient Medications Prior to Visit  Medication Sig Dispense Refill   allopurinol (ZYLOPRIM) 300 MG tablet TAKE 1 TABLET BY MOUTH EVERY DAY 90  tablet 3   amLODipine (NORVASC) 10 MG tablet Take 10 mg by mouth daily.     Ascorbic Acid (VITAMIN C) 1000 MG tablet Take 1,000 mg by mouth daily.     aspirin EC 81 MG tablet Take 81 mg by mouth every other day. Swallow  whole.     Carboxymethylcellulose Sodium (LUBRICANT EYE DROPS OP) Place 1 drop into both eyes 2 (two) times daily as needed (dry/irritated eyes.).      Cholecalciferol (VITAMIN D-3) 25 MCG (1000 UT) CAPS Take 1,000 Units by mouth every other day.     diclofenac Sodium (VOLTAREN) 1 % GEL Apply 2 g topically in the morning and at bedtime.     diltiazem (CARDIZEM CD) 240 MG 24 hr capsule Take 1 capsule (240 mg total) by mouth daily. 30 capsule 1   diltiazem (CARTIA XT) 240 MG 24 hr capsule Take 1 capsule (240 mg total) by mouth daily for 4 days. 4 capsule 0   famotidine (PEPCID) 40 MG tablet Take 1 tablet (40 mg total) by mouth daily. 90 tablet 3   furosemide (LASIX) 40 MG tablet TAKE 1 TABLET BY MOUTH EVERY OTHER DAY 45 tablet 2   isosorbide mononitrate (IMDUR) 60 MG 24 hr tablet Take 1 tablet (60 mg total) by mouth daily. 90 tablet 3   levothyroxine (SYNTHROID) 25 MCG tablet TAKE 1 TABLET BY MOUTH EVERY MORNING WITH BREAKFAST 90 tablet 3   nitroGLYCERIN (NITROSTAT) 0.4 MG SL tablet PLACE 1 TABLET UNDER THE TONGUE EVERY 5 MINUTES AS NEEDED FOR CHEST PAIN. 25 tablet 3   NON FORMULARY Pt uses a cpap nightly     rosuvastatin (CRESTOR) 10 MG tablet TAKE 1/2 TABLET BY MOUTH EVERY DAY 45 tablet 1   tiZANidine (ZANAFLEX) 4 MG tablet Take 2 mg by mouth every 8 (eight) hours as needed for muscle spasms.     Turmeric 500 MG CAPS Take 500 mg by mouth daily.      vitamin B-12 (CYANOCOBALAMIN) 1000 MCG tablet Take 1,000 mcg by mouth every other day.     apixaban (ELIQUIS) 5 MG TABS tablet Take 1 tablet (5 mg total) by mouth 2 (two) times daily. (Patient not taking: Reported on 11/04/2022) 180 tablet 3   No facility-administered medications prior to visit.    Allergies  Allergen Reactions   Acyclovir And Related Other (See Comments)    Accumulates and causes stroke-like side-effects due to cytochrome P450 enzyme deficiency   Benzodiazepines Other (See Comments)    Patient states he gets stroke symptoms with  benzo's.    Compazine [Prochlorperazine Maleate] Other (See Comments)    Accumulates and causes stroke-like side-effects due to cytochrome P450 enzyme deficiency   Coreg [Carvedilol] Other (See Comments)    Accumulates and causes stroke-like side-effects due to cytochrome P450 enzyme deficiency Patient poor metabolizer of CYP2D6 - Coreg undergoes extensive hepatic (including 2D6)   Flomax [Tamsulosin Hcl] Other (See Comments)    Accumulates and causes stroke-like side-effects due to cytochrome P450 enzyme deficiency   Lisinopril Other (See Comments)    Hyperkalemia. 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   Mobic [Meloxicam] Other (See Comments)    Accumulates and causes stroke-like side-effects due to cytochrome P450 enzyme deficiency   Morphine And Codeine Other (See Comments)    Per pt's wife morphine also contraindicated due to p450 enzyme deficiency.     Ondansetron Other (See Comments)    Accumulates and causes stroke-like side-effects due to cytochrome P450 enzyme  deficiency    Oxycodone Other (See Comments)    Accumulates and causes stroke-like side-effects due to cytochrome P450 enzyme deficiency   Promethazine Other (See Comments)    Accumulates and causes stroke-like side-effects due to cytochrome P450 enzyme deficiency   Sertraline Hcl Other (See Comments)    Accumulates and causes stroke-like side-effects due to cytochrome P450 enzyme deficiency   Tramadol Other (See Comments)    Accumulates and causes stroke-like side-effects due to cytochrome P450 enzyme deficiency   Atorvastatin Other (See Comments)    MYALGIAS   Colchicine Other (See Comments)    ataxia   Fentanyl Other (See Comments)    Accumulates and cause stroke like symptoms    Review of Systems  Constitutional:  Negative for chills and fever.  Respiratory:  Negative for shortness of breath.   Cardiovascular:  Negative for chest pain, palpitations and leg  swelling.  Gastrointestinal:  Negative for abdominal pain, constipation, diarrhea, nausea and vomiting.  Genitourinary:  Positive for flank pain. Negative for dysuria, frequency and urgency.  Musculoskeletal:  Positive for back pain. Negative for falls.  Neurological:  Positive for focal weakness. Negative for dizziness and sensory change.       Objective:    Physical Exam Constitutional:      General: He is in acute distress.  HENT:     Mouth/Throat:     Mouth: Mucous membranes are moist.  Eyes:     Extraocular Movements: Extraocular movements intact.     Conjunctiva/sclera: Conjunctivae normal.  Cardiovascular:     Rate and Rhythm: Normal rate.  Pulmonary:     Effort: Pulmonary effort is normal.  Musculoskeletal:     Cervical back: Normal, normal range of motion and neck supple.     Thoracic back: Normal.     Lumbar back: Tenderness present. Decreased range of motion. Positive left straight leg raise test.     Right lower leg: No edema.     Left lower leg: No edema.     Comments: Severe midline and left low back pain, TTP. Left leg weakness. Limited ROM due to pain.   Skin:    General: Skin is warm and dry.     Findings: No bruising or rash.  Neurological:     General: No focal deficit present.     Mental Status: He is alert and oriented to person, place, and time.     Sensory: No sensory deficit.     Motor: Weakness present.     Gait: Gait abnormal.     Deep Tendon Reflexes: Reflexes normal.  Psychiatric:        Mood and Affect: Mood normal.        Behavior: Behavior normal.     BP (!) 148/76 (BP Location: Left Arm, Patient Position: Sitting, Cuff Size: Large)   Pulse 60   Temp 97.6 F (36.4 C) (Temporal)   Ht 5' 8.5" (1.74 m)   Wt 200 lb (90.7 kg)   SpO2 99%   BMI 29.97 kg/m  Wt Readings from Last 3 Encounters:  11/04/22 200 lb (90.7 kg)  10/28/22 198 lb (89.8 kg)  10/24/22 198 lb (89.8 kg)       Assessment & Plan:   Problem List Items Addressed  This Visit   None Visit Diagnoses     Severe lumbar pain    -  Primary   Relevant Orders   POCT Urinalysis Dipstick (Automated) (Completed)   Unable to ambulate       Hematuria,  unspecified type       Relevant Orders   POCT Urinalysis Dipstick (Automated) (Completed)   Left leg weakness       History of renal calculi       Relevant Orders   POCT Urinalysis Dipstick (Automated) (Completed)      He is in acute distress due to severe low back pain.  Denies trauma. POCT urinalysis dipstick shows 3+ blood.  He does have a history of renal calculi. Cholecystectomy 1 week ago and seems to be healing well from this. His wife who is a nurse states he has trouble taking pain medication due to CYP disorder. Unable to ambulate since coming into the office.  Pain worsening. EMS called for transport to the ED for further evaluation and treatment.   I am having Troy Banks. Troy "Chuck" maintain his vitamin C, cyanocobalamin, Carboxymethylcellulose Sodium (LUBRICANT EYE DROPS OP), Turmeric, Vitamin D-3, aspirin EC, tiZANidine, nitroGLYCERIN, allopurinol, furosemide, famotidine, diltiazem, diltiazem, rosuvastatin, levothyroxine, isosorbide mononitrate, apixaban, NON FORMULARY, diclofenac Sodium, and amLODipine.  No orders of the defined types were placed in this encounter.

## 2022-11-08 ENCOUNTER — Telehealth: Payer: Self-pay

## 2022-11-08 NOTE — Telephone Encounter (Signed)
Transition Care Management Unsuccessful Follow-up Telephone Call  Date of discharge and from where:  11/04/2022 [facility]  Attempts:  1st Attempt  Reason for unsuccessful TCM follow-up call:  Left voice message  Troy Banks [place]  [facility] Community Resource Care Guide   ??[email or web address]  ?? [phone]   Website: [email or web address]  [email or web address]      

## 2022-11-09 ENCOUNTER — Telehealth: Payer: Self-pay

## 2022-11-09 NOTE — Telephone Encounter (Signed)
Transition Care Management Unsuccessful Follow-up Telephone Call  Date of discharge and from where:  11/04/2022 [facility]  Attempts:  2nd Attempt  Reason for unsuccessful TCM follow-up call:  Left voice message  Troy Banks [place]  [facility] Community Resource Care Guide   ??[email or web address]  ?? [phone]   Website: [email or web address]  [email or web address]      

## 2022-11-10 ENCOUNTER — Ambulatory Visit: Payer: Medicare Other | Admitting: Podiatry

## 2022-11-10 DIAGNOSIS — L600 Ingrowing nail: Secondary | ICD-10-CM

## 2022-11-10 NOTE — Patient Instructions (Signed)

## 2022-11-10 NOTE — Progress Notes (Signed)
Subjective:  Patient ID: Troy Banks, male    DOB: 09-09-1940,  MRN: [id number]  Chief Complaint  Patient presents with   Ingrown Toenail    Rm 18  Left b/l hallux ingrown pain. No drainage or bleeding.     82 y.o. male presents with concern for left hallux ingrown.  He has pain to the left hallux nail.  He has previously had 1 border of the nail removed but he thinks the whole nail is bothering him at this point causes pain with palpation.  Denies redness or drainage.  Past Medical History:  Diagnosis Date   Allergy    Anxiety    PTSD   Aortic stenosis, moderate 03/27/2015   post AVR echo Echo 11/16: Mild LVH, EF 55-60%, normal wall motion, grade 1 diastolic dysfunction, bioprosthetic AVR okay (mean gradient 18 mmHg) with trivial AI, mild LAE, atrial septal aneurysm, small effusion    Arthritis    "qwhere" (10/29/2015)   Cancer (HCC)    skin cancer   Carotid artery stenosis    Carotid US 11/17: 1-39% bilateral ICA stenosis. F/u prn   Cervical stenosis of spinal canal    fusion 2006   CHF (congestive heart failure) (HCC)    "secondry to OHS"   Closed left pilon fracture    COLONIC POLYPS, HX OF    Complication of anesthesia    "takes a lot to put him under and has woken up during surgery" Difficult to wake up . PTSD; "does not metabolize RX well"that he gets "violent", per pt.    Coronary artery disease    Depression    DIVERTICULOSIS, COLON    Dyspnea    Dysrhythmia    2 bouts of afib post op and at home but corrected with medication.   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    Compazine [Prochlorperazine Maleate] Other (See Comments)    Accumulates and causes stroke-like side-effects due to cytochrome P450 enzyme deficiency   Coreg [Carvedilol] Other (See Comments)    Accumulates and causes stroke-like side-effects due to cytochrome P450 enzyme deficiency Patient poor metabolizer of CYP2D6 - Coreg undergoes extensive hepatic (including 2D6)   Flomax [Tamsulosin Hcl] Other (See Comments)    Accumulates and causes stroke-like side-effects due to cytochrome P450 enzyme deficiency   Lisinopril Other (See  Comments)    Hyperkalemia. 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   Mobic [Meloxicam] Other (See Comments)    Accumulates and causes stroke-like side-effects due to cytochrome P450 enzyme deficiency   Morphine And Codeine Other (See Comments)    Per pt's wife morphine also contraindicated due to p450 enzyme deficiency.     Ondansetron Other (See Comments)    Accumulates and causes stroke-like side-effects due to cytochrome P450 enzyme deficiency    Oxycodone Other (See Comments)    Accumulates and causes stroke-like side-effects due to cytochrome P450 enzyme deficiency   Promethazine Other (See Comments)    Accumulates and causes stroke-like side-effects due to cytochrome P450 enzyme deficiency   Sertraline Hcl Other (See Comments)     Accumulates and causes stroke-like side-effects due to cytochrome P450 enzyme deficiency   Tramadol Other (See Comments)    Accumulates and causes stroke-like side-effects due to cytochrome P450 enzyme deficiency   Atorvastatin Other (See Comments)    MYALGIAS   Colchicine Other (See Comments)    ataxia   Fentanyl Other (See Comments)    Accumulates and cause stroke like symptoms    ROS: Negative except as per HPI above  Objective:  General: AAO x3, NAD  Dermatological: Left hallux nail with significant fungal dystrophy abnormal growth thickening incurvation and onycholysis/subungual debris.  Pain with palpation of the nail.  Vascular:  Dorsalis Pedis artery and Posterior Tibial artery pedal pulses are 2/4 bilateral.  Capillary fill time < 3 sec to all digits.   Neruologic: Grossly intact via light touch bilateral. Protective threshold intact to all sites bilateral.   Musculoskeletal: No gross boney pedal deformities bilateral. No pain, crepitus, or limitation noted with foot and ankle range of motion bilateral. Muscular strength 5/5 in all groups tested bilateral.  Gait: Unassisted, Nonantalgic.   No images are attached to the encounter.   Assessment:   1. Ingrown nail of great toe of left foot      Plan:  Patient was evaluated and treated and all questions answered.  Ingrown Nail, left -Patient elects to proceed with minor surgery to remove ingrown toenail today. Consent reviewed and signed by patient. -Ingrown nail excised. See procedure note. -Educated on post-procedure care including soaking. Written instructions provided and reviewed. -Patient to follow up in 2 weeks for nail check.  Procedure: Excision of Ingrown Toenail Location: Left 1st toe total nail Anesthesia: Lidocaine 1% plain; 1.5 mL and Marcaine 0.5% plain; 1.5 mL, digital block. Skin Prep: Betadine. Dressing: Silvadene; telfa; dry, sterile, compression dressing. Technique: Following skin prep,  the toe was exsanguinated and a tourniquet was secured at the base of the toe. The affected nail border was freed, split with a nail splitter, and excised. Chemical matrixectomy was then performed with phenol and irrigated out with alcohol. The tourniquet was then removed and sterile dressing applied. Disposition: Patient tolerated procedure well. Patient to return in 2 weeks for follow-up.    Return in about 2 weeks (around 11/24/2022) for Nail check.          Corinna Gab, DPM [facility]

## 2022-11-16 ENCOUNTER — Encounter: Payer: Self-pay | Admitting: Physical Medicine and Rehabilitation

## 2022-11-16 ENCOUNTER — Encounter
Payer: Medicare Other | Attending: Physical Medicine and Rehabilitation | Admitting: Physical Medicine and Rehabilitation

## 2022-11-16 VITALS — BP 128/64 | HR 66 | Ht 68.0 in | Wt 200.0 lb

## 2022-11-16 DIAGNOSIS — G8929 Other chronic pain: Secondary | ICD-10-CM | POA: Insufficient documentation

## 2022-11-16 DIAGNOSIS — R519 Headache, unspecified: Secondary | ICD-10-CM | POA: Insufficient documentation

## 2022-11-16 DIAGNOSIS — M542 Cervicalgia: Secondary | ICD-10-CM | POA: Diagnosis not present

## 2022-11-16 MED ORDER — LIDOCAINE HCL 1 % IJ SOLN
6.0000 mL | Freq: Once | INTRAMUSCULAR | Status: AC
Start: 2022-11-16 — End: 2022-11-16
  Administered 2022-11-16: 6 mL

## 2022-11-16 MED ORDER — TRIAMCINOLONE ACETONIDE 40 MG/ML IJ SUSP
0.4000 mg | Freq: Once | INTRAMUSCULAR | Status: AC
Start: 2022-11-16 — End: 2022-11-16
  Administered 2022-11-16: 0.4 mg via INTRAMUSCULAR

## 2022-11-16 NOTE — Progress Notes (Signed)
HPI: Troy Banks is a 82 y.o. 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); H/O NSTEMI-Oct 2016; S/P CABG x 4-Oct 2016; H/O tissue AVR Oct 2016; PAF post CABG/AVR- Amiodarone; Dyslipidemia; Acute gouty arthritis; Coronary artery disease involving native coronary artery with angina pectoris (HCC); Chronic diastolic CHF (congestive heart failure) (HCC); Chronic chest pain; Osteoarthritis of right hip; Lumbar back pain; External hemorrhoid, thrombosed; Internal hemorrhoids; Renal stone; Gross hematuria; Bladder neck obstruction; Closed fracture dislocation of ankle joint, left, with delayed healing, subsequent encounter; At risk for adverse drug reaction; Pilon fracture; Pilon fracture of left tibia; Abscess of right buttock; Progressive angina (HCC); DOE (dyspnea on exertion); Abdominal wall hernia; Unilateral primary osteoarthritis, left hip; Status post total replacement of left hip; Epididymitis; Hypothyroidism; Ankle pain; Chronic right-sided headache; History of lacunar cerebrovascular accident; RUQ pain; Hallux rigidus; Retinoschisis; Sensorineural hearing loss, bilateral; Allergic rhinitis; Encounter for fitting and adjustment of hearing aid; Encounter for fitting and adjustment of spectacles and contact lenses; DNR (do not resuscitate) discussion; Aortic atherosclerosis (HCC); Vitamin D deficiency; Intractable chronic migraine without aura and without status migrainosus; Unstable angina (HCC); Pain of left scapula; Skin lesion; Atrial flutter (HCC); Age-related nuclear cataract, bilateral; Cysts of right upper eyelid; Other  specified disorders of eyelid; Exposure to potentially hazardous substance; Right inguinal hernia; Cervicalgia; and 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 who presents to clinic for treatment of pain related to chronic R sided headaches and cervicalgia  via injection as described below.   No new concerns or complaints. No major changes in medical history since last visit.   Physical Exam:  General: Appropriate appearance for age.  Mental Status: Appropriate mood and affect.  Cardiovascular: RRR, no m/r/g.  Respiratory: CTAB, no rales/rhonchi/wheezing.  Skin: No apparent rashes or lesions.  Neuro: Awake, alert, and oriented x3. No apparent deficits.  MSK: Moving all 4 limbs antigravity and against resistance.   PROCEDURE:  Right   greater occipital nerve block  and bilateral trigger point injections Diagnosis:    ICD-10-CM   1. Chronic right-sided headache  R51.9 lidocaine (XYLOCAINE) 1 % (with pres) injection 6 mL   G89.29 triamcinolone acetonide (KENALOG-40) injection 0.4 mg    2. Cervicalgia  M54.2 lidocaine (XYLOCAINE) 1 % (with pres) injection 6 mL    triamcinolone acetonide (KENALOG-40) injection 0.4 mg      Goals with treatment: [ x ] Decrease pain [ x ] Improve Active / Passive ROM [ x ] Improve ADLs [  ] Improve functional mobility  MEDICATION:  [ x ] Kenalog 40 mg/mL  [ X ] Lidocaine 1%    CONSENT: Obtained in writing per policy. Consent uploaded to chart.  Benefits discussed.  Risks discussed included, but were not limited to, pain and discomfort, bleeding, bruising, allergic reaction, infection. All questions answered to patient/family member/guardian/ caregiver satisfaction. They would like to proceed with procedure. There are no noted contraindications to procedure.  PROCEDURE Time out was preformed No heat sources No antibiotics  The patient was explained about both the benefits and risks of  Right  greater occipital nerve block and  bilateral trigger point injections. After the patient acknowledged an understanding of the risks and benefits, the patient agreed to proceed.  The area was first marked and then prepped in an aseptic fashion with betadine / alcohol. A 30 g, 1/2 inch needle was directed via a direct approach into the Bilateral cervical paraspinals and trapezius muscles. The injection was completed with Kenalog 40 mg/ml 0.2 cc mixed with 3 cc of 1% lidocaine after no blood was aspirated on pull back. Then, another syringe of the same mixture was directed 1/3 between the inion and the right mastoid bone, just medial to the occipital artery, and after no blood on drawback 3 cc was injected.  No complications were encountered. The patient tolerated the procedure well.  Impression: HPI: Troy Banks is a 82 y.o. 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); H/O NSTEMI-Oct 2016; S/P CABG x 4-Oct 2016; H/O tissue AVR Oct 2016; PAF post CABG/AVR- Amiodarone; Dyslipidemia; Acute gouty arthritis; Coronary artery disease involving native coronary artery with angina pectoris (HCC); Chronic diastolic CHF (congestive heart failure) (HCC); Chronic chest pain; Osteoarthritis of right hip; Lumbar back pain; External hemorrhoid, thrombosed; Internal hemorrhoids; Renal stone; Gross hematuria; Bladder neck obstruction; Closed fracture dislocation of ankle joint, left, with delayed healing, subsequent encounter; At risk for adverse drug reaction; Pilon fracture; Pilon fracture of left tibia; Abscess of right buttock; Progressive angina (HCC); DOE (dyspnea on  exertion); Abdominal wall hernia; Unilateral primary osteoarthritis, left hip; Status post total replacement of left hip; Epididymitis; Hypothyroidism; Ankle pain; Chronic right-sided headache; History of lacunar cerebrovascular accident; RUQ pain; Hallux rigidus; Retinoschisis; Sensorineural hearing loss, bilateral; Allergic rhinitis; Encounter for fitting and adjustment of hearing aid; Encounter for fitting and adjustment of spectacles and contact lenses; DNR (do not resuscitate) discussion; Aortic atherosclerosis (HCC); Vitamin D deficiency; Intractable chronic migraine without aura and without status migrainosus; Unstable angina (HCC); Pain of left scapula; Skin lesion; Atrial flutter (HCC); Age-related nuclear cataract, bilateral; Cysts of right upper eyelid; Other specified disorders of eyelid; Exposure to potentially hazardous substance; Right inguinal hernia; Cervicalgia; and 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 who presents to clinic for treatment of R cervicalgia and headache pain . They received a  Bilateral  trigger point injections and R greater occipital nerve block as above.   PLAN: - Resume Usual Activities. Notify Physician of any unusual bleeding, erythema or concern for side effects as reviewed above. - Apply ice prn for pain - Tylenol prn for pain - Follow up PRN - Has HA clinic appointment next Tuesday with Dr. Herbert Moors  Patient/Care Kathleen Lime was ready to learn without apparent learning barriers. Education was provided on diagnosis, treatment options/plan according to patient's preferred learning style. Patient/Care Giver verbalized understanding and agreement with the above plan.   Angelina Sheriff, DO 11/16/2022

## 2022-11-16 NOTE — Patient Instructions (Signed)
Follow up PRN

## 2022-11-18 ENCOUNTER — Other Ambulatory Visit: Payer: Self-pay | Admitting: Student

## 2022-11-18 ENCOUNTER — Other Ambulatory Visit: Payer: Self-pay | Admitting: Neurology

## 2022-11-18 DIAGNOSIS — G43709 Chronic migraine without aura, not intractable, without status migrainosus: Secondary | ICD-10-CM

## 2022-11-18 DIAGNOSIS — G250 Essential tremor: Secondary | ICD-10-CM

## 2022-11-25 ENCOUNTER — Encounter: Payer: Self-pay | Admitting: Cardiology

## 2022-11-25 ENCOUNTER — Ambulatory Visit: Payer: Medicare Other | Admitting: Podiatry

## 2022-11-25 ENCOUNTER — Ambulatory Visit: Payer: Medicare Other | Attending: Cardiology | Admitting: Cardiology

## 2022-11-25 VITALS — BP 148/60 | HR 63 | Ht 68.0 in | Wt 198.2 lb

## 2022-11-25 DIAGNOSIS — Z952 Presence of prosthetic heart valve: Secondary | ICD-10-CM

## 2022-11-25 DIAGNOSIS — Z951 Presence of aortocoronary bypass graft: Secondary | ICD-10-CM | POA: Diagnosis not present

## 2022-11-25 DIAGNOSIS — E119 Type 2 diabetes mellitus without complications: Secondary | ICD-10-CM

## 2022-11-25 DIAGNOSIS — I480 Paroxysmal atrial fibrillation: Secondary | ICD-10-CM | POA: Diagnosis not present

## 2022-11-25 DIAGNOSIS — I25118 Atherosclerotic heart disease of native coronary artery with other forms of angina pectoris: Secondary | ICD-10-CM

## 2022-11-25 DIAGNOSIS — L600 Ingrowing nail: Secondary | ICD-10-CM | POA: Diagnosis not present

## 2022-11-25 NOTE — Patient Instructions (Addendum)
Medication Instructions:  Your physician recommends that you continue on your current medications as directed. Please refer to the Current Medication list given to you today.  *If you need a refill on your cardiac medications before your next appointment, please call your pharmacy*  Lab Work: BMET and CBC on 10/16 between 7:30am to 4:30pm.   Testing/Procedures: Your physician has requested that you have cardiac CT. Cardiac computed tomography (CT) is a painless test that uses an x-ray machine to take clear, detailed pictures of your heart. For further information please visit [email or web address] This will be done about one week prior to your ablation. We will call you to schedule.   Your physician has recommended that you have an ablation. Catheter ablation is a medical procedure used to treat some cardiac arrhythmias (irregular heartbeats). During catheter ablation, a long, thin, flexible tube is put into a blood vessel in your groin (upper thigh), or neck. This tube is called an ablation catheter. It is then guided to your heart through the blood vessel. Radio frequency waves destroy small areas of heart tissue where abnormal heartbeats may cause an arrhythmia to start. You are scheduled for Atrial Fibrillation Ablation on Friday, November 1 with Dr. Steffanie Dunn.Please arrive at the Main Entrance A at [facility]: [address] [place], [place] [address] at 5:30 AM    WATCHMAN: After you ablation you will be contacted by Nurse Navigator, Karsten Fells to schedule your procedure date. If you have any questions she can be reached at [phone].   Follow-Up: At [facility], you and your health needs are our priority.  As part of our continuing mission to provide you with exceptional heart care, we have created designated Provider Care Teams.  These Care Teams include your primary Cardiologist (physician) and Advanced Practice Providers (APPs -  Physician Assistants and Nurse  Practitioners) who all work together to provide you with the care you need, when you need it.  Your next appointment:   We will call you to schedule your follow up appointment

## 2022-11-25 NOTE — Progress Notes (Unsigned)
Electrophysiology Office Note:    Date:  11/26/2022   ID:  Troy Banks, DOB July 03, 1940, MRN [id number]  [facility] Cardiologist:  Peter Swaziland, MD  [facility] Electrophysiologist:  Lanier Prude, MD   Referring MD: Corwin Levins, MD   Chief Complaint: Atrial fibrillation  History of Present Illness:    Troy Banks is a 82 y.o. male who I am seeing today for an opinion regarding atrial fibrillation at the request of Dr. Eden Emms.  The patient has a history of coronary artery disease post CABG in 2016, aortic stenosis post AVR in 2016 and atrial fibrillation flutter since 2016 on amiodarone.  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  He was hospitalized in April 2024 with A-fib with RVR.  The patient saw Troy Banks on September 27, 2022.  The patient previously took primidone for his essential tremor but this was stopped due to drug interactions with his DOAC.  The patient has previously declined Coumadin.  He is with his wife today in clinic who is a retired Charity fundraiser from [employer]. The patient's AF episodes are rare but highly symptomatic.      Their past medical, social and family history was reveiwed.   ROS:   Please see the history of present illness.    All other systems reviewed and are negative.  EKGs/Labs/Other Studies Reviewed:    The following studies were reviewed today:  April 2024 echo EF 60 to 65% RV function normal Aortic valve prosthesis functioning appropriately    Physical Exam:    VS:  BP (!) 148/60   Pulse 63   Ht 5\' 8"  (1.727 m)   Wt 198 lb 3.2 oz (89.9 kg)   SpO2 96%   BMI 30.14 kg/m     Wt Readings from Last 3 Encounters:  11/25/22 198 lb 3.2 oz (89.9 kg)  11/16/22 200 lb (90.7 kg)  11/04/22 200 lb (90.7 kg)     GEN:  Well nourished, well developed in no acute distress CARDIAC: RRR, no murmurs, rubs, gallops RESPIRATORY:   Clear to auscultation without rales, wheezing or rhonchi       ASSESSMENT AND PLAN:    1. Paroxysmal atrial fibrillation (HCC)   2. Coronary artery disease of native artery of native heart with stable angina pectoris (HCC)   3. S/P CABG x 4   4. S/P AVR     #Persistent atrial fibrillation and flutter Symptomatic.  Led to recent hospitalization. He is now taking a stable anticoagulant with Eliquis. I discussed treatment options for his atrial fibrillation and stroke risk mitigation strategies during today's clinic appointment.  I discussed atrial fibrillation and flutter ablation in detail including the risks, success rates and recovery.  His antiarrhythmic drug options are limited given his other medications.  He would like to proceed with catheter ablation which I think is reasonable.  Discussed treatment options today for AF and AFL including antiarrhythmic drug therapy and ablation. Discussed risks, recovery and likelihood of success with each treatment strategy. Risk, benefits, and alternatives to EP study and radiofrequency ablation for afib were discussed. These risks include but are not limited to stroke, bleeding, vascular damage, tamponade, perforation, damage to the esophagus, lungs, phrenic nerve and other structures, pulmonary vein stenosis, worsening renal function, and death.  Discussed potential need for repeat ablation procedures and antiarrhythmic drugs after an initial ablation. The patient understands these risk and wishes to proceed.  We will therefore proceed with catheter ablation at the next available time.  Carto, ICE, anesthesia are requested for the procedure.  Will also obtain CT PV protocol prior to the procedure to exclude LAA thrombus and further evaluate atrial anatomy.  ---------------------  I have seen Troy Banks in the office today who is being considered for a Watchman left atrial appendage closure device. I believe they will benefit from this  procedure given their history of atrial fibrillation, CHA2DS2-VASc score of 8 and unadjusted ischemic stroke rate of 10.8% per year. Unfortunately, the patient is not felt to be a long term anticoagulation candidate secondary to history of CYP mutation, drug-drug interactions. The patient's chart has been reviewed and I feel that they would be a candidate for short term oral anticoagulation after Watchman implant.   It is my belief that after undergoing a LAA closure procedure, Troy Banks will not need long term anticoagulation which eliminates anticoagulation side effects and major bleeding risk.   Procedural risks for the Watchman implant have been reviewed with the patient including a 0.5% risk of stroke, <1% risk of perforation and <1% risk of device embolization. Other risks include bleeding, vascular damage, tamponade, worsening renal function, and death. The patient understands these risk and wishes to proceed.     The published clinical data on the safety and effectiveness of WATCHMAN include but are not limited to the following: - Holmes DR, Everlene Farrier, Sick P et al. for the PROTECT AF Investigators. Percutaneous closure of the left atrial appendage versus warfarin therapy for prevention of stroke in patients with atrial fibrillation: a randomised non-inferiority trial. Lancet 2009; 374: 534-42. Everlene Farrier, Doshi SK, Isa Rankin D et al. on behalf of the PROTECT AF Investigators. Percutaneous Left Atrial Appendage Closure for Stroke Prophylaxis in Patients With Atrial Fibrillation 2.3-Year Follow-up of the PROTECT AF (Watchman Left Atrial Appendage System for Embolic Protection in Patients With Atrial Fibrillation) Trial. Circulation 2013; 127:720-729. - Alli O, Doshi S,  Kar S, Reddy VY, Sievert H et al. Quality of Life Assessment in the Randomized PROTECT AF (Percutaneous Closure of the Left Atrial Appendage Versus Warfarin Therapy for Prevention of Stroke in Patients With Atrial  Fibrillation) Trial of Patients at Risk for Stroke With Nonvalvular Atrial Fibrillation. J Am Coll Cardiol 2013; 61:1790-8. Aline August DR, Mia Creek, Price M, Whisenant B, Sievert H, Doshi S, Huber K, Reddy V. Prospective randomized evaluation of the Watchman left atrial appendage Device in patients with atrial fibrillation versus long-term warfarin therapy; the PREVAIL trial. Journal of the [employer] of Cardiology, Vol. 4, No. 1, 2014, 1-11. - Kar S, Doshi SK, Sadhu A, Horton R, Osorio J et al. Primary outcome evaluation of a next-generation left atrial appendage closure device: results from the PINNACLE FLX trial. Circulation 2021;143(18)1754-1762.    After today's visit with the patient which was dedicated solely for shared decision making visit regarding LAA closure device, the patient decided to proceed with the LAA appendage closure procedure scheduled to be done in the near future at [facility]. Prior to the procedure, I would like to obtain a gated CT scan of the chest with contrast timed for PV/LA visualization.    HAS-BLED score 4 Hypertension Yes  Abnormal renal and liver function (Dialysis, transplant, Cr >2.26 mg/dL /Cirrhosis or Bilirubin >2x Normal or AST/ALT/AP >3x Normal) No  Stroke Yes  Bleeding No  Labile INR (Unstable/high INR) No  Elderly (>65) Yes  Drugs or alcohol (? 8  drinks/week, anti-plt or NSAID) Yes   CHA2DS2-VASc Score = 8  The patient's score is based upon: CHF History: 1 HTN History: 1 Diabetes History: 1 Stroke History: 2 Vascular Disease History: 1 Age Score: 2 Gender Score: 0      Signed, Sheria Lang T. Lalla Brothers, MD, [facility], [facility] 11/26/2022 9:28 AM    Electrophysiology [facility]

## 2022-11-25 NOTE — Progress Notes (Signed)
Subjective: Troy Banks is a 82 y.o.  male returns to office today for follow up evaluation after having left Hallux total nail ingrown removal with phenol and alcohol matrixectomy approximately 2 weeks ago. Patient has been soaking using epsom salts and applying topical antibiotic covered with bandaid daily. Patient denies fevers, chills, nausea, vomiting. Denies any calf pain, chest pain, SOB.   Objective:  Vitals: Reviewed  General: Well developed, nourished, in no acute distress, alert and oriented x3   Dermatology: Skin is warm, dry and supple bilateral. Left hallux nail bed appears to be clean, dry, with mild granular tissue and surrounding scab. There is no surrounding erythema, edema, drainage/purulence. The remaining nails appear unremarkable at this time. There are no other lesions or other signs of infection present.  Neurovascular status: Intact. No lower extremity swelling; No pain with calf compression bilateral.  Musculoskeletal: Decreased tenderness to palpation of the left hallux nail bed. Muscular strength within normal limits bilateral.   Assesement and Plan: S/p phenol and alcohol matrixectomy to the  left hallux nail total, doing well.   -Continue soaking in epsom salts twice a day followed by antibiotic ointment and a band-aid. Can leave uncovered at night. Continue this until completely healed.  -If the area has not healed in 2 weeks, call the office for follow-up appointment, or sooner if any problems arise.  -Monitor for any signs/symptoms of infection. Call the office immediately if any occur or go directly to the emergency room. Call with any questions/concerns.        Corinna Gab, DPM [facility] / [facility]                   11/25/2022

## 2022-12-22 DIAGNOSIS — G4486 Cervicogenic headache: Secondary | ICD-10-CM | POA: Diagnosis not present

## 2022-12-22 DIAGNOSIS — M47812 Spondylosis without myelopathy or radiculopathy, cervical region: Secondary | ICD-10-CM | POA: Diagnosis not present

## 2022-12-22 DIAGNOSIS — G4440 Drug-induced headache, not elsewhere classified, not intractable: Secondary | ICD-10-CM | POA: Diagnosis not present

## 2022-12-22 DIAGNOSIS — R7303 Prediabetes: Secondary | ICD-10-CM | POA: Insufficient documentation

## 2022-12-22 DIAGNOSIS — G43719 Chronic migraine without aura, intractable, without status migrainosus: Secondary | ICD-10-CM | POA: Diagnosis not present

## 2022-12-22 DIAGNOSIS — E785 Hyperlipidemia, unspecified: Secondary | ICD-10-CM | POA: Insufficient documentation

## 2022-12-22 DIAGNOSIS — R52 Pain, unspecified: Secondary | ICD-10-CM | POA: Diagnosis not present

## 2022-12-22 DIAGNOSIS — M542 Cervicalgia: Secondary | ICD-10-CM | POA: Diagnosis not present

## 2022-12-22 DIAGNOSIS — G609 Hereditary and idiopathic neuropathy, unspecified: Secondary | ICD-10-CM | POA: Insufficient documentation

## 2022-12-22 DIAGNOSIS — G4733 Obstructive sleep apnea (adult) (pediatric): Secondary | ICD-10-CM | POA: Diagnosis not present

## 2022-12-26 ENCOUNTER — Ambulatory Visit: Payer: Medicare Other | Admitting: Podiatry

## 2022-12-26 ENCOUNTER — Encounter: Payer: Self-pay | Admitting: Podiatry

## 2022-12-26 DIAGNOSIS — B351 Tinea unguium: Secondary | ICD-10-CM

## 2022-12-26 DIAGNOSIS — M79674 Pain in right toe(s): Secondary | ICD-10-CM | POA: Diagnosis not present

## 2022-12-26 DIAGNOSIS — E119 Type 2 diabetes mellitus without complications: Secondary | ICD-10-CM | POA: Diagnosis not present

## 2022-12-26 DIAGNOSIS — M79675 Pain in left toe(s): Secondary | ICD-10-CM | POA: Diagnosis not present

## 2022-12-26 NOTE — Progress Notes (Signed)
This patient returns to the office for evaluation and treatment of long thick painful nails .  This patient is unable to trim his own nails since the patient cannot reach the feet.  Patient says the nails are painful walking and wearing his shoes.  He returns for preventive foot care services.    General Appearance  Alert, conversant and in no acute stress.  Vascular  Dorsalis pedis and posterior tibial  pulses are palpable  bilaterally.  Capillary return is within normal limits  bilaterally. Temperature is within normal limits  bilaterally.  Neurologic  Senn-Weinstein monofilament wire test within normal limits  bilaterally. Muscle power within normal limits bilaterally.  Nails Thick disfigured discolored nails with subungual debris  from hallux to fifth toes bilaterally. No evidence of bacterial infection or drainage bilaterally.  Orthopedic  No limitations of motion  feet .  No crepitus or effusions noted.  No bony pathology or digital deformities noted.  Ankle/STJ immobile left foot/ankle.  Fused 1st MPJ right foot.  Skin  normotropic skin with no porokeratosis noted bilaterally.  No signs of infections or ulcers noted.     Onychomycosis  Pain in toes right foot  Pain in toes left foot  Debridement  of nails  1-5  B/L with a nail nipper.  Nails were then filed using a dremel tool with no incidents.    RTC  3 month   Gregory Mayer DPM 

## 2022-12-27 ENCOUNTER — Encounter: Payer: Self-pay | Admitting: Internal Medicine

## 2022-12-27 ENCOUNTER — Ambulatory Visit (INDEPENDENT_AMBULATORY_CARE_PROVIDER_SITE_OTHER): Payer: Medicare Other | Admitting: Internal Medicine

## 2022-12-27 VITALS — BP 122/68 | HR 53 | Temp 98.1°F | Ht 68.0 in | Wt 198.0 lb

## 2022-12-27 DIAGNOSIS — E785 Hyperlipidemia, unspecified: Secondary | ICD-10-CM

## 2022-12-27 DIAGNOSIS — I10 Essential (primary) hypertension: Secondary | ICD-10-CM | POA: Diagnosis not present

## 2022-12-27 DIAGNOSIS — E559 Vitamin D deficiency, unspecified: Secondary | ICD-10-CM

## 2022-12-27 DIAGNOSIS — F17200 Nicotine dependence, unspecified, uncomplicated: Secondary | ICD-10-CM | POA: Diagnosis not present

## 2022-12-27 DIAGNOSIS — E039 Hypothyroidism, unspecified: Secondary | ICD-10-CM | POA: Diagnosis not present

## 2022-12-27 DIAGNOSIS — E119 Type 2 diabetes mellitus without complications: Secondary | ICD-10-CM

## 2022-12-27 LAB — HEPATIC FUNCTION PANEL
ALT: 27 U/L (ref 0–53)
AST: 19 U/L (ref 0–37)
Albumin: 4.3 g/dL (ref 3.5–5.2)
Alkaline Phosphatase: 60 U/L (ref 39–117)
Bilirubin, Direct: 0.3 mg/dL (ref 0.0–0.3)
Total Bilirubin: 1.6 mg/dL — ABNORMAL HIGH (ref 0.2–1.2)
Total Protein: 6.9 g/dL (ref 6.0–8.3)

## 2022-12-27 LAB — LIPID PANEL
Cholesterol: 109 mg/dL (ref 0–200)
HDL: 37.1 mg/dL — ABNORMAL LOW (ref >39.00)
LDL Cholesterol: 36 mg/dL (ref 0–99)
NonHDL: 71.97
Total CHOL/HDL Ratio: 3
Triglycerides: 181 mg/dL — ABNORMAL HIGH (ref 0.0–149.0)
VLDL: 36.2 mg/dL (ref 0.0–40.0)

## 2022-12-27 LAB — BASIC METABOLIC PANEL
BUN: 19 mg/dL (ref 6–23)
CO2: 27 mEq/L (ref 19–32)
Calcium: 9.7 mg/dL (ref 8.4–10.5)
Chloride: 105 mEq/L (ref 96–112)
Creatinine, Ser: 0.93 mg/dL (ref 0.40–1.50)
GFR: 77.83 mL/min (ref >60.00)
Glucose, Bld: 161 mg/dL — ABNORMAL HIGH (ref 70–99)
Potassium: 4.3 mEq/L (ref 3.5–5.1)
Sodium: 139 mEq/L (ref 135–145)

## 2022-12-27 LAB — VITAMIN D 25 HYDROXY (VIT D DEFICIENCY, FRACTURES): VITD: 28.36 ng/mL — ABNORMAL LOW (ref 30.00–100.00)

## 2022-12-27 LAB — CBC WITH DIFFERENTIAL/PLATELET
Basophils Absolute: 0.1 10*3/uL (ref 0.0–0.1)
Basophils Relative: 0.7 % (ref 0.0–3.0)
Eosinophils Absolute: 0.1 10*3/uL (ref 0.0–0.7)
Eosinophils Relative: 1.2 % (ref 0.0–5.0)
HCT: 47.1 % (ref 39.0–52.0)
Hemoglobin: 15.7 g/dL (ref 13.0–17.0)
Lymphocytes Relative: 20.6 % (ref 12.0–46.0)
Lymphs Abs: 1.7 10*3/uL (ref 0.7–4.0)
MCHC: 33.3 g/dL (ref 30.0–36.0)
MCV: 92.8 fl (ref 78.0–100.0)
Monocytes Absolute: 0.7 10*3/uL (ref 0.1–1.0)
Monocytes Relative: 8.9 % (ref 3.0–12.0)
Neutro Abs: 5.5 10*3/uL (ref 1.4–7.7)
Neutrophils Relative %: 68.6 % (ref 43.0–77.0)
Platelets: 156 10*3/uL (ref 150.0–400.0)
RBC: 5.08 Mil/uL (ref 4.22–5.81)
RDW: 14.5 % (ref 11.5–15.5)
WBC: 8 10*3/uL (ref 4.0–10.5)

## 2022-12-27 LAB — HEMOGLOBIN A1C: Hgb A1c MFr Bld: 7.1 % — ABNORMAL HIGH (ref 4.6–6.5)

## 2022-12-27 LAB — TSH: TSH: 3.45 u[IU]/mL (ref 0.35–5.50)

## 2022-12-27 NOTE — Assessment & Plan Note (Signed)
Pt counsled to quit, pt not ready °

## 2022-12-27 NOTE — Assessment & Plan Note (Signed)
Last vitamin D Lab Results  Component Value Date   VD25OH 28.36 (L) 12/27/2022   Low, to start oral replacement

## 2022-12-27 NOTE — Assessment & Plan Note (Signed)
Lab Results  Component Value Date   HGBA1C 7.1 (H) 12/27/2022   Mild unocntrolled, goal A1c < 7,, pt to continue DM diet, declines other med change for now

## 2022-12-27 NOTE — Assessment & Plan Note (Signed)
Lab Results  Component Value Date   TSH 3.45 12/27/2022   Stable, pt to continue levothyroxine 25 mcg qd

## 2022-12-27 NOTE — Progress Notes (Signed)
The test results show that your current treatment is OK, as the tests are stable.  Please continue the same plan.  There is no other need for change of treatment or further evaluation based on these results, at this time.  thanks 

## 2022-12-27 NOTE — Patient Instructions (Signed)
Please continue all other medications as before, and refills have been done if requested.  Please have the pharmacy call with any other refills you may need.  Please continue your efforts at being more active, low cholesterol diet, and weight control  Please keep your appointments with your specialists as you may have planned  Please stop smoking

## 2022-12-27 NOTE — Assessment & Plan Note (Signed)
BP Readings from Last 3 Encounters:  12/27/22 122/68  11/25/22 (!) 148/60  11/16/22 128/64   Stable, pt to continue medical treatment norvasc 10 every day, card cd 240,

## 2022-12-27 NOTE — Progress Notes (Signed)
Patient ID: Troy Banks, male   DOB: 1941/04/29, 82 y.o.   MRN: [id number]        Chief Complaint: follow up HTN, HLD, DM, low thyroid, low Vit D       HPI:  Troy Banks is a 82 y.o. male here overall doing ok, Pt denies chest pain, increased sob or doe, wheezing, orthopnea, PND, increased LE swelling, palpitations, dizziness or syncope.   Pt denies polydipsia, polyuria, or new focal neuro s/s.    Pt denies fever, wt loss, night sweats, loss of appetite, or other constitutional symptoms  Also for cardiac ablation later this yr.  Has seen [facility] with potential new med to start soon.  Also may need ESI to neck for possible cervicogenic headache.   Also accupuncture right shoulder.  Still smoking not ready to quit Wt Readings from Last 3 Encounters:  12/27/22 198 lb (89.8 kg)  11/25/22 198 lb 3.2 oz (89.9 kg)  11/16/22 200 lb (90.7 kg)   BP Readings from Last 3 Encounters:  12/27/22 122/68  11/25/22 (!) 148/60  11/16/22 128/64         Past Medical History:  Diagnosis Date   Allergy    Anxiety    PTSD   Aortic stenosis, moderate 03/27/2015   post AVR echo Echo 11/16: Mild LVH, EF 55-60%, normal wall motion, grade 1 diastolic dysfunction, bioprosthetic AVR okay (mean gradient 18 mmHg) with trivial AI, mild LAE, atrial septal aneurysm, small effusion    Arthritis    "qwhere" (10/29/2015)   Cancer (HCC)    skin cancer   Carotid artery stenosis    Carotid US 11/17: 1-39% bilateral ICA stenosis. F/u prn   Cervical stenosis of spinal canal    fusion 2006   CHF (congestive heart failure) (HCC)    "secondry to [facility]"   Closed left pilon fracture    COLONIC POLYPS, HX OF    Complication of anesthesia    "takes a lot to put him under and has woken up during surgery" Difficult to wake up . PTSD; "does not metabolize RX well"that he gets "violent", per pt.    Coronary artery disease    Depression    DIVERTICULOSIS, COLON    Dyspnea    Dysrhythmia    2 bouts of afib  post op and at home but corrected with medication.   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)    confirmed heterozygous 10/24/14 labs   PTSD (post-traumatic stress disorder)    PTSD (post-traumatic stress disorder)    Sleep apnea    On a CPAP   Past Surgical History:  Procedure Laterality Date   ANKLE FUSION Left 03/17/2017   Procedure: FUSION  PILON FRACTURE WITH BONE GRAFTING;  Surgeon: Truitt Merle  P, MD;  Location: [facility] OR;  Service: Orthopedics;  Laterality: Left;   ANTERIOR FUSION CERVICAL SPINE  2002   AORTIC VALVE REPLACEMENT N/A 03/30/2015   Procedure: AORTIC VALVE REPLACEMENT (AVR);  Surgeon: Delight Ovens, MD;  Location: [facility] OR;  Service: Open Heart Surgery;  Laterality: N/A;   CARDIAC CATHETERIZATION N/A 03/26/2015   Procedure: Left Heart Cath and Coronary Angiography;  Surgeon: Marykay Lex, MD;  Location: [facility] INVASIVE CV LAB;  Service: Cardiovascular;  Laterality: N/A;   CARDIAC VALVE REPLACEMENT     CHOLECYSTECTOMY N/A 10/28/2022   Procedure: LAPAROSCOPIC CHOLECYSTECTOMY WITH INTRAOPERATIVE CHOLANGIOGRAM;  Surgeon: Luretha Murphy, MD;  Location: [facility] ORS;  Service: General;  Laterality: N/A;   COLONOSCOPY W/ POLYPECTOMY     CORONARY ARTERY BYPASS GRAFT N/A 03/30/2015   Procedure: CORONARY ARTERY BYPASS GRAFTING (CABG) x four, using left internal mammary artery and left    leg greater saphenous vein harvested endoscopically  ;  Surgeon: Delight Ovens, MD;  Location: [facility] OR;  Service: Open Heart Surgery;  Laterality: N/A;   CORONARY/GRAFT ANGIOGRAPHY N/A 12/03/2021   Procedure: CORONARY/GRAFT ANGIOGRAPHY;  Surgeon: Swaziland, [name], MD;  Location: [facility] INVASIVE CV LAB;  Service: Cardiovascular;  Laterality: N/A;   CYSTOSCOPY/URETEROSCOPY/HOLMIUM LASER/STENT PLACEMENT Bilateral 11/07/2016   Procedure: CYSTOSCOPY/URETEROSCOPY/HOLMIUM LASER/STENT PLACEMENT;  Surgeon: Hildred Laser, MD;  Location: [facility] ORS;  Service: Urology;  Laterality: Bilateral;   CYSTOSCOPY/URETEROSCOPY/HOLMIUM LASER/STENT PLACEMENT Bilateral 11/23/2016   Procedure: CYSTOSCOPY/BILATERAL URETEROSCOPY/HOLMIUM LASER/STENT EXCHANGE;  Surgeon: Hildred Laser, MD;  Location: [facility] ORS;  Service: Urology;  Laterality: Bilateral;  NEEDS 90 MIN    EXTERNAL FIXATION LEG Left 02/22/2017   Procedure: EXTERNAL FIXATION LEFT ANKLE;  Surgeon: Sheral Apley, MD;  Location[facility] OR;  Service: Orthopedics;  Laterality: Left;   HEMORRHOID BANDING  1970s   INGUINAL HERNIA REPAIR Left 1962   KIDNEY STONE SURGERY  1986   "cut me open"   LEFT HEART CATH AND CORS/GRAFTS ANGIOGRAPHY N/A 10/25/2018   Procedure: LEFT HEART CATH AND CORS/GRAFTS ANGIOGRAPHY;  Surgeon: Marykay Lex, MD;  Location: [facility] INVASIVE CV LAB;  Service: Cardiovascular;  Laterality: N/A;   LEFT HEART CATH AND CORS/GRAFTS ANGIOGRAPHY N/A 12/03/2021   Procedure: LEFT HEART CATH AND CORS/GRAFTS ANGIOGRAPHY;  Surgeon: Swaziland, [name], MD;  Location: [facility] INVASIVE CV LAB;  Service: Cardiovascular;  Laterality: N/A;   POSTERIOR FUSION CERVICAL SPINE  2006   TEE WITHOUT CARDIOVERSION N/A 03/30/2015   Procedure: TRANSESOPHAGEAL ECHOCARDIOGRAM (TEE);  Surgeon: Delight Ovens, MD;  Location: [facility] OR;  Service: Open Heart Surgery;  Laterality: N/A;   TESTICLE TORSION REDUCTION Right 1960   TOE FUSION Left 1985 & 2004   "great toe" and removal   TONSILLECTOMY     TOTAL HIP ARTHROPLASTY Right 10/27/2015    Procedure: RIGHT TOTAL HIP ARTHROPLASTY ANTERIOR APPROACH and steroid injection right foot;  Surgeon: Kathryne Hitch, MD;  Location: [facility] OR;  Service: Orthopedics;  Laterality: Right;   TOTAL HIP ARTHROPLASTY Left 08/13/2019   TOTAL HIP ARTHROPLASTY Left 08/13/2019   Procedure: LEFT TOTAL HIP ARTHROPLASTY ANTERIOR APPROACH;  Surgeon: Kathryne Hitch, MD;  Location: [facility] OR;  Service: Orthopedics;  Laterality: Left;    reports that he has been smoking cigarettes. He started smoking about 66 years ago. He has a 34 pack-year smoking history. He has never used smokeless tobacco. He reports current alcohol use of about 3.0 standard drinks of alcohol per week. He reports that he does not use drugs. family history includes  Alcohol abuse in an other family member; Arthritis in an other family member; Diabetes in his mother and paternal grandfather; Drug abuse in his sister; Healthy in his brother, daughter, and son; Heart disease in his father, maternal grandfather, maternal grandmother, mother, and paternal grandfather; Hypertension in his father, maternal grandfather, maternal grandmother, mother, and paternal grandfather; Prostate cancer in his maternal uncle; Tremor in his sister. Allergies  Allergen Reactions   Acyclovir And Related Other (See Comments)    Accumulates and causes stroke-like side-effects due to cytochrome P450 enzyme deficiency   Benzodiazepines Other (See Comments)    Patient states he gets stroke symptoms with benzo's.    Compazine [Prochlorperazine Maleate] Other (See Comments)    Accumulates and causes stroke-like side-effects due to cytochrome P450 enzyme deficiency   Coreg [Carvedilol] Other (See Comments)    Accumulates and causes stroke-like side-effects due to cytochrome P450 enzyme deficiency Patient poor metabolizer of CYP2D6 - Coreg undergoes extensive hepatic (including 2D6)   Flomax [Tamsulosin Hcl] Other (See Comments)    Accumulates and causes stroke-like  side-effects due to cytochrome P450 enzyme deficiency   Lisinopril Other (See Comments)    Hyperkalemia. 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   Mobic [Meloxicam] Other (See Comments)    Accumulates and causes stroke-like side-effects due to cytochrome P450 enzyme deficiency   Morphine And Codeine Other (See Comments)    Per pt's wife morphine also contraindicated due to p450 enzyme deficiency.     Ondansetron Other (See Comments)    Accumulates and causes stroke-like side-effects due to cytochrome P450 enzyme deficiency    Oxycodone Other (See Comments)    Accumulates and causes stroke-like side-effects due to cytochrome P450 enzyme deficiency   Promethazine Other (See Comments)    Accumulates and causes stroke-like side-effects due to cytochrome P450 enzyme deficiency   Sertraline Hcl Other (See Comments)    Accumulates and causes stroke-like side-effects due to cytochrome P450 enzyme deficiency   Tramadol Other (See Comments)    Accumulates and causes stroke-like side-effects due to cytochrome P450 enzyme deficiency   Atorvastatin Other (See Comments)    MYALGIAS   Colchicine Other (See Comments)    ataxia   Fentanyl Other (See Comments)    Accumulates and cause stroke like symptoms   Hydrocodone Other (See Comments)   Current Outpatient Medications on File Prior to Visit  Medication Sig Dispense Refill   allopurinol (ZYLOPRIM) 300 MG tablet TAKE 1 TABLET BY MOUTH EVERY DAY 90 tablet 3   amLODipine (NORVASC) 10 MG tablet Take 10 mg by mouth daily.     apixaban (ELIQUIS) 5 MG TABS tablet Take 1 tablet (5 mg total) by mouth 2 (two) times daily. 180 tablet 3   Ascorbic Acid (VITAMIN C) 1000 MG tablet Take 1,000 mg by mouth daily.     aspirin EC 81 MG tablet Take 81 mg by mouth every other day. Swallow whole.     Carboxymethylcellulose Sodium (LUBRICANT EYE DROPS OP) Place 1 drop into both eyes 2 (two) times daily as needed  (dry/irritated eyes.).      Cholecalciferol (VITAMIN D-3) 25 MCG (1000 UT) CAPS Take 1,000 Units by mouth every other day.     diclofenac Sodium (VOLTAREN) 1 % GEL Apply 2 g topically in the morning and at bedtime.     diltiazem (CARDIZEM CD) 240 MG 24 hr capsule Take 1 capsule (240 mg total) by mouth daily. 30 capsule 1   diltiazem (CARDIZEM CD) 240 MG 24  hr capsule TAKE 1 CAPSULE BY MOUTH DAILY 90 capsule 3   famotidine (PEPCID) 40 MG tablet Take 1 tablet (40 mg total) by mouth daily. 90 tablet 3   furosemide (LASIX) 40 MG tablet TAKE 1 TABLET BY MOUTH EVERY OTHER DAY 45 tablet 2   isosorbide mononitrate (IMDUR) 60 MG 24 hr tablet Take 1 tablet (60 mg total) by mouth daily. 90 tablet 3   levothyroxine (SYNTHROID) 25 MCG tablet TAKE 1 TABLET BY MOUTH EVERY MORNING WITH BREAKFAST 90 tablet 3   nitroGLYCERIN (NITROSTAT) 0.4 MG SL tablet PLACE 1 TABLET UNDER THE TONGUE EVERY 5 MINUTES AS NEEDED FOR CHEST PAIN. 25 tablet 3   NON FORMULARY Pt uses a cpap nightly     rosuvastatin (CRESTOR) 10 MG tablet TAKE 1/2 TABLET BY MOUTH EVERY DAY 45 tablet 1   tiZANidine (ZANAFLEX) 4 MG tablet Take 2 mg by mouth every 8 (eight) hours as needed for muscle spasms.     Turmeric 500 MG CAPS Take 500 mg by mouth daily.      vitamin B-12 (CYANOCOBALAMIN) 1000 MCG tablet Take 1,000 mcg by mouth every other day.     diltiazem (CARTIA XT) 240 MG 24 hr capsule Take 1 capsule (240 mg total) by mouth daily for 4 days. 4 capsule 0   [DISCONTINUED] ranitidine (ZANTAC) 150 MG tablet TAKE 1 TABLET BY MOUTH 2 TIMES DAILY (Patient taking differently: Take 150 mg by mouth 2 (two) times a day.) 180 tablet 0   No current facility-administered medications on file prior to visit.        ROS:  All others reviewed and negative.  Objective        PE:  BP 122/68 (BP Location: Right Arm, Patient Position: Sitting, Cuff Size: Normal)   Pulse (!) 53   Temp 98.1 F (36.7 C) (Oral)   Ht 5\' 8"  (1.727 m)   Wt 198 lb (89.8 kg)   SpO2  97%   BMI 30.11 kg/m                 Constitutional: Pt appears in NAD               HENT: Head: NCAT.                Right Ear: External ear normal.                 Left Ear: External ear normal.                Eyes: . Pupils are equal, round, and reactive to light. Conjunctivae and EOM are normal               Nose: without d/c or deformity               Neck: Neck supple. Gross normal ROM               Cardiovascular: Normal rate and regular rhythm.                 Pulmonary/Chest: Effort normal and breath sounds without rales or wheezing.                Abd:  Soft, NT, ND, + BS, no organomegaly               Neurological: Pt is alert. At baseline orientation, motor grossly intact               Skin: Skin is warm.  No rashes, no other new lesions, LE edema - none               Psychiatric: Pt behavior is normal without agitation   Micro: none  Cardiac tracings I have personally interpreted today:  none  Pertinent Radiological findings (summarize): none   Lab Results  Component Value Date   WBC 8.0 12/27/2022   HGB 15.7 12/27/2022   HCT 47.1 12/27/2022   PLT 156.0 12/27/2022   GLUCOSE 161 (H) 12/27/2022   CHOL 109 12/27/2022   TRIG 181.0 (H) 12/27/2022   HDL 37.10 (L) 12/27/2022   LDLDIRECT 77.0 06/28/2022   LDLCALC 36 12/27/2022   ALT 27 12/27/2022   AST 19 12/27/2022   NA 139 12/27/2022   K 4.3 12/27/2022   CL 105 12/27/2022   CREATININE 0.93 12/27/2022   BUN 19 12/27/2022   CO2 27 12/27/2022   TSH 3.45 12/27/2022   PSA 1.95 06/28/2022   INR 1.1 09/15/2022   HGBA1C 7.1 (H) 12/27/2022   MICROALBUR 14.7 (H) 06/28/2022   Assessment/Plan:  Troy Banks is a 82 y.o. White or Caucasian [1] male with  has a past medical history of Allergy, Anxiety, Aortic stenosis, moderate (03/27/2015), Arthritis, Cancer (HCC), Carotid artery stenosis, Cervical stenosis of spinal canal, CHF (congestive heart failure) (HCC), Closed left pilon fracture, COLONIC POLYPS, HX OF,  Complication of anesthesia, Coronary artery disease, Depression, DIVERTICULOSIS, COLON, Dyspnea, Dysrhythmia, Familial tremor (03/19/2014), Family history of adverse reaction to anesthesia, GERD (gastroesophageal reflux disease), GOUT, Headache, Heart murmur, High cholesterol, History of blood transfusion, History of kidney stones, HYPERTENSION, Hypothyroidism, Myocardial infarction (HCC), OSA on CPAP, Pneumonia, 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  Smoker Pt counsled to quit, pt not ready  Diet-controlled diabetes mellitus (HCC) Lab Results  Component Value Date   HGBA1C 7.1 (H) 12/27/2022   Mild unocntrolled, goal A1c < 7,, pt to continue DM diet, declines other med change for now   Dyslipidemia Lab Results  Component Value Date   LDLCALC 36 12/27/2022   Stable, pt to continue current statin crestor 10 qd   Essential hypertension BP Readings from Last 3 Encounters:  12/27/22 122/68  11/25/22 (!) 148/60  11/16/22 128/64   Stable, pt to continue medical treatment norvasc 10 every day, card cd 240,    Hypothyroidism Lab Results  Component Value Date   TSH 3.45 12/27/2022   Stable, pt to continue levothyroxine 25 mcg qd   Vitamin D deficiency Last vitamin D Lab Results  Component Value Date   VD25OH 28.36 (L) 12/27/2022   Low, to start oral replacement  Followup: Return in about 6 months (around 06/29/2023).  Oliver Barre, MD 12/27/2022 9:25 PM [facility]

## 2022-12-27 NOTE — Assessment & Plan Note (Signed)
Lab Results  Component Value Date   LDLCALC 36 12/27/2022   Stable, pt to continue current statin crestor 10 qd

## 2022-12-30 ENCOUNTER — Ambulatory Visit: Payer: Medicare Other | Admitting: Podiatry

## 2022-12-30 DIAGNOSIS — M542 Cervicalgia: Secondary | ICD-10-CM | POA: Diagnosis not present

## 2022-12-30 DIAGNOSIS — G4486 Cervicogenic headache: Secondary | ICD-10-CM | POA: Diagnosis not present

## 2022-12-30 DIAGNOSIS — M549 Dorsalgia, unspecified: Secondary | ICD-10-CM | POA: Diagnosis not present

## 2022-12-30 DIAGNOSIS — M7918 Myalgia, other site: Secondary | ICD-10-CM | POA: Diagnosis not present

## 2022-12-30 DIAGNOSIS — M47812 Spondylosis without myelopathy or radiculopathy, cervical region: Secondary | ICD-10-CM | POA: Diagnosis not present

## 2023-01-20 DIAGNOSIS — M7918 Myalgia, other site: Secondary | ICD-10-CM | POA: Diagnosis not present

## 2023-01-28 ENCOUNTER — Encounter: Payer: Self-pay | Admitting: Internal Medicine

## 2023-01-28 DIAGNOSIS — R21 Rash and other nonspecific skin eruption: Secondary | ICD-10-CM

## 2023-02-02 ENCOUNTER — Ambulatory Visit (INDEPENDENT_AMBULATORY_CARE_PROVIDER_SITE_OTHER): Payer: Medicare Other | Admitting: Family Medicine

## 2023-02-02 ENCOUNTER — Encounter: Payer: Self-pay | Admitting: Family Medicine

## 2023-02-02 ENCOUNTER — Ambulatory Visit (INDEPENDENT_AMBULATORY_CARE_PROVIDER_SITE_OTHER): Payer: Medicare Other

## 2023-02-02 VITALS — BP 134/64 | HR 58 | Temp 97.5°F | Ht 68.0 in | Wt 201.2 lb

## 2023-02-02 DIAGNOSIS — Z87898 Personal history of other specified conditions: Secondary | ICD-10-CM | POA: Diagnosis not present

## 2023-02-02 DIAGNOSIS — I700 Atherosclerosis of aorta: Secondary | ICD-10-CM | POA: Diagnosis not present

## 2023-02-02 DIAGNOSIS — R051 Acute cough: Secondary | ICD-10-CM

## 2023-02-02 DIAGNOSIS — S2241XA Multiple fractures of ribs, right side, initial encounter for closed fracture: Secondary | ICD-10-CM | POA: Diagnosis not present

## 2023-02-02 DIAGNOSIS — R059 Cough, unspecified: Secondary | ICD-10-CM | POA: Diagnosis not present

## 2023-02-02 DIAGNOSIS — R918 Other nonspecific abnormal finding of lung field: Secondary | ICD-10-CM | POA: Diagnosis not present

## 2023-02-02 NOTE — Progress Notes (Signed)
Subjective:  Troy Banks is a 82 y.o. male who presents for a 6 day hx of cough. Cough is NP and improving.  Denies fever, chills, palpitations, shortness of breath, abdominal pain, LE edema.    He reports using a cleaning solution with bleach prior to cough.    ROS as in subjective.   Objective: Vitals:   02/02/23 1439  BP: 134/64  Pulse: (!) 58  Temp: (!) 97.5 F (36.4 C)  SpO2: 93%    General appearance: Alert, WD/WN, no distress                             Skin: warm, no rash                           Head: no sinus tenderness                            Eyes: conjunctiva normal, corneas clear, PERRLA                            Ears: pearly TMs, external ear canals normal                          Nose: septum midline, no drainage             Mouth/throat: MMM, tongue normal, mild pharyngeal erythema                           Neck: supple, no adenopathy, no thyromegaly, nontender                          Heart: RRR                         Lungs: CTA bilaterally, no wheezes, rales, or rhonchi      Assessment: Acute cough - Plan: DG Chest 2 View  History of chemical exposure - Plan: DG Chest 2 View   Plan: No red flag symptoms. Euvolemic.  Chest X ray ordered and negative for acute cardiopulmonary findings. No antibiotic needed as long as he is continuing to improve.  Call/return if worsening or if not improving.

## 2023-02-08 ENCOUNTER — Telehealth: Payer: Self-pay | Admitting: Cardiology

## 2023-02-08 NOTE — Telephone Encounter (Signed)
Pt's wife calling to see if they can get him in before his 11/1 appointment. Please advise

## 2023-02-08 NOTE — Telephone Encounter (Signed)
Spoke with patient's wife, the patient is scheduled for an ablation with Dr. Lalla Brothers on 11/1 and she would like to know if it can be done any sooner. Advised patient's wife that there are currently no openings and that he is on the wait list. We will call if something sooner opens up.

## 2023-02-13 ENCOUNTER — Other Ambulatory Visit: Payer: Self-pay | Admitting: Cardiology

## 2023-03-13 ENCOUNTER — Encounter: Payer: Self-pay | Admitting: Physician Assistant

## 2023-03-13 ENCOUNTER — Other Ambulatory Visit (INDEPENDENT_AMBULATORY_CARE_PROVIDER_SITE_OTHER): Payer: Medicare Other

## 2023-03-13 ENCOUNTER — Ambulatory Visit: Payer: Medicare Other | Admitting: Physician Assistant

## 2023-03-13 DIAGNOSIS — M25552 Pain in left hip: Secondary | ICD-10-CM

## 2023-03-13 MED ORDER — LIDOCAINE HCL 1 % IJ SOLN
3.0000 mL | INTRAMUSCULAR | Status: AC | PRN
Start: 2023-03-13 — End: 2023-03-13
  Administered 2023-03-13: 3 mL

## 2023-03-13 MED ORDER — METHYLPREDNISOLONE ACETATE 40 MG/ML IJ SUSP
40.0000 mg | INTRAMUSCULAR | Status: AC | PRN
Start: 2023-03-13 — End: 2023-03-13
  Administered 2023-03-13: 40 mg via INTRA_ARTICULAR

## 2023-03-13 NOTE — Progress Notes (Signed)
Office Visit Note   Patient: Troy Banks           Date of Birth: [birth year]           MRN: [id number] Visit Date: 03/13/2023              Requested by: Corwin Levins, MD [address] [place],  [place] [address] PCP: Corwin Levins, MD   Assessment & Plan: Visit Diagnoses:  1. Pain in left hip     Plan: He shown IT band stretching exercises which like for him to begin on Wednesday.  Will see him back in 2 weeks see how he is doing overall.  Questions encouraged and answered at length.  Radiographs reviewed with patient.  Follow-Up Instructions: Return in about 2 weeks (around 03/27/2023).   Orders:  Orders Placed This Encounter  Procedures   Large Joint Inj: L greater trochanter   XR HIP UNILAT W OR W/O PELVIS 2-3 VIEWS LEFT   No orders of the defined types were placed in this encounter.     Procedures: Large Joint Inj: L greater trochanter on 03/13/2023 8:44 AM Indications: pain Details: 22 G 1.5 in needle, lateral approach  Arthrogram: No  Medications: 3 mL lidocaine 1 %; 40 mg methylPREDNISolone acetate 40 MG/ML Outcome: tolerated well, no immediate complications Procedure, treatment alternatives, risks and benefits explained, specific risks discussed. Consent was given by the patient. Immediately prior to procedure a time out was called to verify the correct patient, procedure, equipment, support staff and site/side marked as required. Patient was prepped and draped in the usual sterile fashion.       Clinical Data: No additional findings.   Subjective: Chief Complaint  Patient presents with   Left Hip - Pain    HPI Mr. Quesinberry comes in today due to left hip pain.  Pain is started over the last month no injury.  Pain is lateral aspect of the hip some groin pain.  Constant achy sensation.  He has tried Voltaren gel with no relief.  Excedrin also with no relief.  Unable to lie on the hip at night.  History of left total hip arthroplasty by Dr.  Raye Sorrow 08/13/2019.  No numbness tingling down the legs.  Review of Systems  Constitutional:  Negative for chills and fever.     Objective: Vital Signs: There were no vitals taken for this visit.  Physical Exam Constitutional:      Appearance: He is not ill-appearing or diaphoretic.  Pulmonary:     Effort: Pulmonary effort is normal.  Neurological:     Mental Status: He is alert and oriented to person, place, and time.  Psychiatric:        Mood and Affect: Mood normal.     Ortho Exam Bilateral hips: Has pain with external rotation of the left hip when seated.  Logroll bilateral hips causes no pain.  Tenderness over the left hip trochanteric region and down the IT band.  Good range of motion by hip lateral hips otherwise. Specialty Comments:  No specialty comments available.  Imaging: XR HIP UNILAT W OR W/O PELVIS 2-3 VIEWS LEFT  Result Date: 03/13/2023 AP pelvis lateral view left hip: No acute fractures.  Status post bilateral total hip arthroplasty well-seated components.  Heterotopic bone seen about the left hip.  No acute findings.    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 10/14/2022   Right inguinal hernia 10/05/2022   Age-related nuclear cataract, bilateral 09/23/2022   Cysts of right upper eyelid 09/23/2022   Other specified disorders of eyelid 09/23/2022   Exposure to potentially hazardous substance 09/23/2022   Atrial flutter (HCC) 09/15/2022   Skin lesion 06/28/2022   Pain of left scapula 04/20/2022   Unstable angina (HCC) 12/02/2021   Intractable chronic migraine without aura and without status  migrainosus 11/11/2021   Vitamin D deficiency 06/13/2021   Encounter for fitting and adjustment of hearing aid 11/26/2020   Encounter for fitting and adjustment of spectacles and contact lenses 11/26/2020   DNR (do not resuscitate) discussion 11/26/2020   Aortic atherosclerosis (HCC) 11/26/2020   Allergic rhinitis 10/05/2020   Sensorineural hearing loss, bilateral 09/29/2020   Hallux rigidus 09/23/2020   Retinoschisis 09/23/2020   RUQ pain 06/10/2020   History of lacunar cerebrovascular accident 04/05/2020   Chronic right-sided headache 03/16/2020   Ankle pain 01/22/2020   Hypothyroidism 11/26/2019   Epididymitis 08/28/2019   Unilateral primary osteoarthritis, left hip 08/13/2019   Status post total replacement of left hip 08/13/2019   Abdominal wall hernia 11/02/2018   Progressive angina (HCC) 10/25/2018   DOE (dyspnea on exertion) 10/25/2018   Abscess of right buttock 11/27/2017   Pilon fracture 03/17/2017   Pilon fracture of left tibia 03/17/2017   At risk for adverse drug reaction 02/28/2017   Closed fracture dislocation of ankle joint, left, with delayed healing, subsequent encounter 02/22/2017   Renal stone 10/26/2016   Gross hematuria 10/26/2016   Bladder neck obstruction 10/26/2016   External hemorrhoid, thrombosed 06/10/2016   Internal hemorrhoids 06/10/2016   Lumbar back pain 05/19/2016   Osteoarthritis of right hip 10/27/2015   Chronic chest pain 09/22/2015   Acute gouty arthritis 06/11/2015   Coronary artery disease involving native coronary artery with angina pectoris (HCC) 06/11/2015   Chronic diastolic CHF (congestive heart failure) (HCC) 06/11/2015   H/O tissue AVR Oct 2016 06/10/2015   PAF post CABG/AVR- Amiodarone 06/10/2015   Dyslipidemia 06/10/2015   S/P CABG x 4-Oct 2016 03/30/2015   H/O NSTEMI-Oct 2016 03/27/2015   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)    Depression 09/08/2014   GERD (gastroesophageal reflux disease)     Essential tremor 03/31/2014   Musculoskeletal pain 01/01/2014   Spondylolisthesis at L4-L5 level 01/01/2014   Sinus bradycardia 12/31/2013   Greater trochanteric bursitis of right hip 12/04/2013   OSA on CPAP    Diet-controlled diabetes mellitus (HCC) 07/08/2009   History of colonic polyps 12/31/2008   GOUT 12/22/2008   Essential hypertension 12/22/2008   Diverticulosis of colon 12/22/2008   NEPHROLITHIASIS, HX OF 12/22/2008   Past Medical History:  Diagnosis Date   Allergy    Anxiety    PTSD   Aortic stenosis, moderate 03/27/2015   post AVR echo Echo 11/16: Mild LVH, EF 55-60%, normal wall motion, grade 1 diastolic dysfunction, bioprosthetic AVR okay (mean gradient 18 mmHg) with trivial AI, mild LAE, atrial septal aneurysm, small effusion    Arthritis    "qwhere" (10/29/2015)   Cancer (HCC)    skin cancer   Carotid artery stenosis    Carotid US 11/17: 1-39% bilateral ICA stenosis. F/u prn   Cervical stenosis of spinal canal  fusion 2006   CHF (congestive heart failure) (HCC)    "secondry to [facility]"   Closed left pilon fracture    COLONIC POLYPS, HX OF    Complication of anesthesia    "takes a lot to put him under and has woken up during surgery" Difficult to wake up . PTSD; "does not metabolize RX well"that he gets "violent", per pt.    Coronary artery disease    Depression    DIVERTICULOSIS, COLON    Dyspnea    Dysrhythmia    2 bouts of afib post op and at home but corrected with medication.   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)    confirmed heterozygous 10/24/14 labs   PTSD (post-traumatic stress disorder)    PTSD (post-traumatic stress disorder)    Sleep apnea    On a CPAP    Family History  Problem Relation Age of Onset   Heart disease Mother    Hypertension Mother    Diabetes Mother    Heart disease Father    Hypertension Father    Tremor Sister    Healthy Brother    Healthy Son    Prostate cancer Maternal Uncle    Hypertension Maternal Grandmother    Heart disease Maternal Grandmother    Hypertension Maternal Grandfather    Heart disease Maternal Grandfather    Hypertension Paternal Grandfather    Heart disease Paternal Grandfather    Diabetes Paternal Grandfather    Alcohol abuse Other    Arthritis Other    Drug abuse Sister    Healthy Daughter    Colon polyps Neg Hx    Esophageal cancer Neg Hx    Rectal cancer Neg Hx    Stomach cancer Neg Hx    Colon cancer Neg Hx     Past Surgical History:  Procedure Laterality Date   ANKLE FUSION Left 03/17/2017   Procedure: FUSION  PILON FRACTURE WITH BONE GRAFTING;  Surgeon: Roby Lofts, MD;  Location: [facility] OR;  Service: Orthopedics;  Laterality: Left;   ANTERIOR FUSION CERVICAL SPINE  2002   AORTIC VALVE REPLACEMENT N/A 03/30/2015   Procedure: AORTIC VALVE REPLACEMENT (AVR);  Surgeon: Delight Ovens, MD;  Location: [facility] OR;  Service: Open Heart Surgery;  Laterality: N/A;   CARDIAC CATHETERIZATION N/A 03/26/2015   Procedure: Left Heart Cath and Coronary Angiography;  Surgeon: Marykay Lex, MD;  Location: [facility] INVASIVE CV LAB;  Service: Cardiovascular;  Laterality: N/A;   CARDIAC VALVE REPLACEMENT     CHOLECYSTECTOMY N/A 10/28/2022   Procedure: LAPAROSCOPIC CHOLECYSTECTOMY WITH INTRAOPERATIVE CHOLANGIOGRAM;  Surgeon: Luretha Murphy, MD;  Location: [facility] ORS;  Service: General;  Laterality: N/A;   COLONOSCOPY W/ POLYPECTOMY     CORONARY  ARTERY BYPASS GRAFT N/A 03/30/2015  Procedure: CORONARY ARTERY BYPASS GRAFTING (CABG) x four, using left internal mammary artery and left    leg greater saphenous vein harvested endoscopically ;  Surgeon: Delight Ovens, MD;  Location: [facility] OR;  Service: Open Heart Surgery;  Laterality: N/A;   CORONARY/GRAFT ANGIOGRAPHY N/A 12/03/2021   Procedure: CORONARY/GRAFT ANGIOGRAPHY;  Surgeon: Swaziland, [name], MD;  Location: [facility] INVASIVE CV LAB;  Service: Cardiovascular;  Laterality: N/A;   CYSTOSCOPY/URETEROSCOPY/HOLMIUM LASER/STENT PLACEMENT Bilateral 11/07/2016   Procedure: CYSTOSCOPY/URETEROSCOPY/HOLMIUM LASER/STENT PLACEMENT;  Surgeon: Hildred Laser, MD;  Location: [facility] ORS;  Service: Urology;  Laterality: Bilateral;   CYSTOSCOPY/URETEROSCOPY/HOLMIUM LASER/STENT PLACEMENT Bilateral 11/23/2016   Procedure: CYSTOSCOPY/BILATERAL URETEROSCOPY/HOLMIUM LASER/STENT EXCHANGE;  Surgeon: Hildred Laser, MD;  Location: [facility] ORS;  Service: Urology;  Laterality: Bilateral;  NEEDS 90 MIN    EXTERNAL FIXATION LEG Left 02/22/2017   Procedure: EXTERNAL FIXATION LEFT ANKLE;  Surgeon: Sheral Apley, MD;  Location: [facility] OR;  Service: Orthopedics;  Laterality: Left;   HEMORRHOID BANDING  1970s   INGUINAL HERNIA REPAIR Left 1962   KIDNEY STONE SURGERY  1986   "cut me open"   LEFT HEART CATH AND CORS/GRAFTS ANGIOGRAPHY N/A 10/25/2018   Procedure: LEFT HEART CATH AND CORS/GRAFTS ANGIOGRAPHY;  Surgeon: Marykay Lex, MD;  Location: [facility] INVASIVE CV LAB;  Service: Cardiovascular;  Laterality: N/A;   LEFT HEART CATH AND CORS/GRAFTS ANGIOGRAPHY N/A 12/03/2021   Procedure: LEFT HEART CATH AND CORS/GRAFTS ANGIOGRAPHY;  Surgeon: Swaziland, [name], MD;  Location: [facility] [place] INVASIVE CV LAB;  Service: Cardiovascular;  Laterality: N/A;   POSTERIOR FUSION CERVICAL SPINE  2006   TEE WITHOUT CARDIOVERSION N/A 03/30/2015   Procedure: TRANSESOPHAGEAL ECHOCARDIOGRAM (TEE);  Surgeon: Delight Ovens, MD;  Location: [facility] OR;  Service: Open Heart  Surgery;  Laterality: N/A;   TESTICLE TORSION REDUCTION Right 1960   TOE FUSION Left 1985 & 2004   "great toe" and removal   TONSILLECTOMY     TOTAL HIP ARTHROPLASTY Right 10/27/2015   Procedure: RIGHT TOTAL HIP ARTHROPLASTY ANTERIOR APPROACH and steroid injection right foot;  Surgeon: Kathryne Hitch, MD;  Location: [facility] OR;  Service: Orthopedics;  Laterality: Right;   TOTAL HIP ARTHROPLASTY Left 08/13/2019   TOTAL HIP ARTHROPLASTY Left 08/13/2019   Procedure: LEFT TOTAL HIP ARTHROPLASTY ANTERIOR APPROACH;  Surgeon: Kathryne Hitch, MD;  Location: [facility] OR;  Service: Orthopedics;  Laterality: Left;   Social History   Occupational History   Occupation: Retired Economist  Tobacco Use   Smoking status: Some Days    Current packs/day: 0.00    Average packs/day: 1 pack/day for 34.0 years (34.0 ttl pk-yrs)    Types: Cigarettes    Start date: 06/06/1956    Last attempt to quit: 06/06/1990    Years since quitting: 32.7   Smokeless tobacco: Never  Vaping Use   Vaping status: Never Used  Substance and Sexual Activity   Alcohol use: Yes    Alcohol/week: 3.0 standard drinks of alcohol    Types: 1 Cans of beer, 2 Shots of liquor per week    Comment: occassional   Drug use: No   Sexual activity: Yes

## 2023-03-17 ENCOUNTER — Other Ambulatory Visit: Payer: Self-pay | Admitting: Cardiology

## 2023-03-20 ENCOUNTER — Other Ambulatory Visit: Payer: Self-pay | Admitting: Internal Medicine

## 2023-03-22 ENCOUNTER — Ambulatory Visit: Payer: Medicare Other | Attending: Cardiology

## 2023-03-22 DIAGNOSIS — I25118 Atherosclerotic heart disease of native coronary artery with other forms of angina pectoris: Secondary | ICD-10-CM | POA: Diagnosis not present

## 2023-03-22 DIAGNOSIS — Z951 Presence of aortocoronary bypass graft: Secondary | ICD-10-CM | POA: Diagnosis not present

## 2023-03-22 DIAGNOSIS — I480 Paroxysmal atrial fibrillation: Secondary | ICD-10-CM

## 2023-03-22 DIAGNOSIS — Z952 Presence of prosthetic heart valve: Secondary | ICD-10-CM | POA: Diagnosis not present

## 2023-03-22 NOTE — Progress Notes (Signed)
03/29/2023 DOC TEA   07-11-1940  [id number]  Primary Physician Corwin Levins, MD Primary Cardiologist: Dr Arrion Broaddus [place]  HPI:  82 y.o. Male seen for follow up CAD. He was admitted 10/19-10/31/16 with a non-STEMI. LHC demonstrated 3 vessel CAD. He also had significant aortic stenosis.  He underwent CABG with LIMA-LAD, SVG-intermediate, SVG-LCx, SVG-distal RCA and bioprosthetic AVR.  Atenolol was initiated but later stopped secondary to bradycardia. He did develop atrial fibrillation. He was placed on amiodarone with return to NSR.  He has an essential tremor and is on Primidone chronically. 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  Amiodarone was stopped in January 2017 at the time he was hospitalized for an acute gout attack in his [place]. He has maintained NSR.  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 He has a daughter who is a family physician in [place].  In May 2018 he complained of more dyspnea on exertion and some chest tightness.  CXR was clear. Myoview study was normal.  In June 2018 he underwent cystoscopy and lithotripsy for renal stones.   In September 2018 he fell off his roof fracturing his left ankle and had a compression fracture of L2. He required extensive surgery of his ankle.   In May 2020 he presented with symptoms of increased DOE. He underwent cardiac cath showing all grafts were patent. Echo showed normal EF with evidence of diastolic dysfunction. The AV prothesis function had not changed. He had mild pulmonary HTN. His diuretic dose was increased.  He did undergo left THR in March 2022.    he presented to the ED on 12/02/2021 with chest pain that radiated to his back, unrelieved by nitroglycerin.  He was hospitalized from 12/02/2021 to 12/04/2021. Troponin was negative.  He underwent repeat cardiac catheterization which revealed patent bypass grafts, medical therapy was recommended and isosorbide  was added to his medication regimen.  Echocardiogram showed EF 55 to 60%, normal LV function, mild LVH, mildly reduced RV function, mild RV enlargement, moderate dilation of the left atrium, mild lesion of the right atrium, mild mitral valve regurgitation, stable bioprosthetic aortic valve with mild aortic stenosis.    He was admitted in April with Afib/flutter with RVR. Seen by EP. Started on IV Cardizem and switched to PO. Converted to NSR. Was on primadone for tremor and this was discontinued due to interaction with DOAC with plans to start DOAC as outpatient. Echo stable. In May he presented with right flank pain and found to have gallstones. Underwent lap chole without complications. He was seen by EP- Dr Lalla Brothers on June 21 with plans for [facility] device and ablation. The ablation is scheduled for Nov 1.  He does note some daily chest pressure. Has a hard time describing it. Is above diaphragm and worse with stress but not exertion. Only lasts a couple of minutes. No bleeding on Eliquis. Does note tremors are worse since off primadone.      Current Outpatient Medications  Medication Sig Dispense Refill   allopurinol (ZYLOPRIM) 300 MG tablet TAKE 1 TABLET BY MOUTH EVERY DAY 90 tablet 3   amLODipine (NORVASC) 10 MG tablet Take 10 mg by mouth daily.     apixaban (ELIQUIS) 5 MG TABS tablet Take 1 tablet (5 mg total) by mouth 2 (two) times daily. 180 tablet 3   Ascorbic Acid (VITAMIN C) 1000 MG tablet Take 1,000 mg by mouth daily.  aspirin EC 81 MG tablet Take 81 mg by mouth every other day. Swallow whole.     Carboxymethylcellulose Sodium (LUBRICANT EYE DROPS OP) Place 1 drop into both eyes 2 (two) times daily as needed (dry/irritated eyes.).      Cholecalciferol (VITAMIN D-3) 25 MCG (1000 UT) CAPS Take 1,000 Units by mouth every other day.     diclofenac Sodium (VOLTAREN) 1 % GEL Apply 2 g topically in the morning and at bedtime.     famotidine (PEPCID) 40 MG tablet Take 1 tablet (40 mg  total) by mouth daily. 90 tablet 3   furosemide (LASIX) 40 MG tablet TAKE 1 TABLET BY MOUTH EVERY OTHER DAY 45 tablet 2   isosorbide mononitrate (IMDUR) 60 MG 24 hr tablet Take 1 tablet (60 mg total) by mouth daily. 90 tablet 3   levothyroxine (SYNTHROID) 25 MCG tablet TAKE 1 TABLET BY MOUTH EVERY MORNING WITH BREAKFAST 90 tablet 3   nitroGLYCERIN (NITROSTAT) 0.4 MG SL tablet PLACE 1 TABLET UNDER THE TONGUE EVERY 5 MINUTES AS NEEDED FOR CHEST PAIN. 25 tablet 3   NON FORMULARY Pt uses a cpap nightly     rosuvastatin (CRESTOR) 10 MG tablet TAKE 1/2 TABLET BY MOUTH EVERY DAY 45 tablet 2   tiZANidine (ZANAFLEX) 4 MG tablet Take 2 mg by mouth every 8 (eight) hours as needed for muscle spasms.     Turmeric 500 MG CAPS Take 500 mg by mouth daily.      vitamin B-12 (CYANOCOBALAMIN) 1000 MCG tablet Take 1,000 mcg by mouth every other day.     diltiazem (CARDIZEM CD) 240 MG 24 hr capsule TAKE 1 CAPSULE BY MOUTH DAILY (Patient not taking: Reported on 03/29/2023) 90 capsule 3   No current facility-administered medications for this visit.    Allergies  Allergen Reactions   Acyclovir And Related Other (See Comments)    Accumulates and causes stroke-like side-effects due to cytochrome P450 enzyme deficiency   Benzodiazepines Other (See Comments)    Patient states he gets stroke symptoms with benzo's.    Compazine [Prochlorperazine Maleate] Other (See Comments)    Accumulates and causes stroke-like side-effects due to cytochrome P450 enzyme deficiency   Coreg [Carvedilol] Other (See Comments)    Accumulates and causes stroke-like side-effects due to cytochrome P450 enzyme deficiency Patient poor metabolizer of CYP2D6 - Coreg undergoes extensive hepatic (including 2D6)   Flomax [Tamsulosin Hcl] Other (See Comments)    Accumulates and causes stroke-like side-effects due to cytochrome P450 enzyme deficiency   Lisinopril Other (See Comments)    Hyperkalemia. Accumulates and causes stroke-like side-effects  due to cytochrome P450 enzyme deficiency.   Mobic [Meloxicam] Other (See Comments)    Accumulates and causes stroke-like side-effects due to cytochrome P450 enzyme deficiency   Morphine And Codeine Other (See Comments)    Per pt's wife morphine also contraindicated due to p450 enzyme deficiency.     Ondansetron Other (See Comments)    Accumulates and causes stroke-like side-effects due to cytochrome P450 enzyme deficiency    Oxycodone Other (See Comments)    Accumulates and causes stroke-like side-effects due to cytochrome P450 enzyme deficiency   Promethazine Other (See Comments)    Accumulates and causes stroke-like side-effects due to cytochrome P450 enzyme deficiency   Sertraline Hcl Other (See Comments)    Accumulates and causes stroke-like side-effects due to cytochrome P450 enzyme deficiency   Tramadol Other (See Comments)    Accumulates and causes stroke-like side-effects due to cytochrome P450 enzyme deficiency   Atorvastatin  Other (See Comments)    MYALGIAS   Colchicine Other (See Comments)    ataxia   Fentanyl Other (See Comments)    Accumulates and cause stroke like symptoms   Hydrocodone Other (See Comments)    Social History   Socioeconomic History   Marital status: Married    Spouse name: Danne Baxter, RN   Number of children: 2   Years of education: Not on file   Highest education level: Some college, no degree  Occupational History   Occupation: Retired Economist  Tobacco Use   Smoking status: Some Days    Current packs/day: 0.00    Average packs/day: 1 pack/day for 34.0 years (34.0 ttl pk-yrs)    Types: Cigarettes    Start date: 06/06/1956    Last attempt to quit: 06/06/1990    Years since quitting: 32.8   Smokeless tobacco: Never  Vaping Use   Vaping status: Never Used  Substance and Sexual Activity   Alcohol use: Yes    Alcohol/week: 3.0 standard drinks of alcohol    Types: 1 Cans of beer, 2 Shots of liquor per week    Comment:  occassional   Drug use: No   Sexual activity: Yes  Other Topics Concern   Not on file  Social History Narrative   Left Handed    One Story home    Social Determinants of Health   Financial Resource Strain: Low Risk  (09/01/2022)   Overall Financial Resource Strain (CARDIA)    Difficulty of Paying Living Expenses: Not hard at all  Food Insecurity: No Food Insecurity (09/20/2022)   Hunger Vital Sign    Worried About Running Out of Food in the Last Year: Never true    Ran Out of Food in the Last Year: Never true  Transportation Needs: No Transportation Needs (09/20/2022)   PRAPARE - Administrator, Civil Service (Medical): No    Lack of Transportation (Non-Medical): No  Physical Activity: Inactive (09/01/2022)   Exercise Vital Sign    Days of Exercise per Week: 0 days    Minutes of Exercise per Session: 0 min  Stress: Stress Concern Present (09/01/2022)   [employer] of Occupational Health - Occupational Stress Questionnaire    Feeling of Stress : To some extent  Social Connections: Unknown (09/01/2022)   Social Connection and Isolation Panel [NHANES]    Frequency of Communication with Friends and Family: More than three times a week    Frequency of Social Gatherings with Friends and Family: Twice a week    Attends Religious Services: Not on Marketing executive or Organizations: Yes    Attends Banker Meetings: More than 4 times per year    Marital Status: Married  Catering manager Violence: Not At Risk (09/15/2022)   Humiliation, Afraid, Rape, and Kick questionnaire    Fear of Current or Ex-Partner: No    Emotionally Abused: No    Physically Abused: No    Sexually Abused: No     Review of Systems: As noted in HPI All other systems reviewed and are otherwise negative except as noted above.   Blood pressure (!) 144/64, pulse 63, height 5\' 8"  (1.727 m), weight 202 lb 6.4 oz (91.8 kg), SpO2 95%.  GENERAL:  Well appearing WM in NAD HEENT:   PERRL, EOMI, sclera are clear. Oropharynx is clear. NECK:  No jugular venous distention, carotid upstroke brisk and symmetric, no bruits, no thyromegaly or adenopathy LUNGS:  Clear to auscultation bilaterally CHEST:  Unremarkable, old sternotomy scar HEART:  RRR,  PMI not displaced or sustained,S1 and S2 within normal limits, no S3, no S4: no clicks, no rubs, gr 2/6 systolic murmur RUSB.  ABD:  Soft, nontender. BS +, no masses or bruits. No hepatomegaly, no splenomegaly EXT:  2 + pulses throughout, Tr edema on left- left calf is larger than right., no cyanosis no clubbing SKIN:  Warm and dry.  No rashes NEURO:  Alert and oriented x 3. Cranial nerves II through XII intact. PSYCH:  Cognitively intact   EKG Interpretation Date/Time:  Wednesday March 29 2023 10:47:21 EDT Ventricular Rate:  63 PR Interval:  228 QRS Duration:  78 QT Interval:  414 QTC Calculation: 423 R Axis:   -5  Text Interpretation: Sinus rhythm with 1st degree A-V block with Fusion complexes Nonspecific ST and T wave abnormality When compared with ECG of 16-Sep-2022 15:32, Fusion complexes are now Present Premature ventricular complexes are no longer Present Minimal criteria for Inferior infarct are no longer Present Nonspecific T wave abnormality no longer evident in Inferior leads Nonspecific T wave abnormality, worse in Lateral leads Confirmed by [place], Dinh Ayotte [phone]) on 03/29/2023 10:50:32 AM   Lab Results  Component Value Date   WBC 7.2 03/22/2023   HGB 13.2 03/22/2023   HCT 41.7 03/22/2023   PLT 235 03/22/2023   GLUCOSE 257 (H) 03/22/2023   CHOL 109 12/27/2022   TRIG 181.0 (H) 12/27/2022   HDL 37.10 (L) 12/27/2022   LDLDIRECT 77.0 06/28/2022   LDLCALC 36 12/27/2022   ALT 27 12/27/2022   AST 19 12/27/2022   NA 142 03/22/2023   K 4.7 03/22/2023   CL 103 03/22/2023   CREATININE 1.03 03/22/2023   BUN 13 03/22/2023   CO2 22 03/22/2023   TSH 3.45 12/27/2022   PSA 1.95 06/28/2022   INR 1.1 09/15/2022    HGBA1C 7.1 (H) 12/27/2022   MICROALBUR 14.7 (H) 06/28/2022     Echo 04/24/15: Study Conclusions   - Left ventricle: The cavity size was normal. Wall thickness was   increased in a pattern of mild LVH. Systolic function was normal.   The estimated ejection fraction was in the range of 55% to 60%.   Wall motion was normal; there were no regional wall motion   abnormalities. Doppler parameters are consistent with abnormal   left ventricular relaxation (grade 1 diastolic dysfunction). - Aortic valve: A bioprosthesis was present. There was trivial   regurgitation. - Left atrium: The atrium was mildly dilated. - Atrial septum: There was an atrial septal aneurysm. - Pericardium, extracardiac: A small pericardial effusion was   identified.   Impressions:   - Normal LV systolic function; grade 1 diastolic dysfunction; mild   LVH; s/p AVR with trace AI and mean gradient of 18 mmHg; mild   LAE; trace MR.  Myoview 10/22/16: Study Highlights     The left ventricular ejection fraction is mildly decreased (45-54%). Nuclear stress EF: 53%. No T wave inversion was noted during stress. There was no ST segment deviation noted during stress. This is a low risk study.   No reversible ischemia. LVEF 53% with normal wall motion. This is a low risk study.     Cardiac cath: 10/25/18:  LEFT HEART CATH AND CORS/GRAFTS ANGIOGRAPHY  Conclusion    Ost LAD to Prox LAD lesion is 80% stenosed. Mid LAD lesion is 100% stenosed -after second septal branch which coursed is a tandem LAD LIMA-dLAD graft was visualized  by angiography and is normal in caliber. The graft exhibits no disease. Ost Ramus-1 lesion is 90% stenosed. Ost Ramus-2 lesion is 60% stenosed (shortly after lateral branch). SVG-Ramus graft was visualized by angiography and is large. The graft exhibits minimal luminal irregularities. Ost Cx lesion is 80% stenosed. Prox Cx to Mid Cx lesion is 80% stenosed. Mid Cx lesion is 60% stenosed. SVG-dCx  graft was visualized by angiography and is large. The graft exhibits minimal luminal irregularities. Retrograde filling of OM 2 and AV groove circumflex Prox RCA to Mid RCA lesion is 100% stenosed. This is progression from 99% stenosis pre-CABG. SVG-dRCA graft was visualized by angiography and is large. The graft exhibits no disease. Unable to cross aortic valve prosthesis.   SUMMARY Severe native CAD essentially unchanged from post CABG with exception of now 100% occluded RCA. 4 out of 4 patent grafts: LIMA-LAD, SVG-RI (D1), SVG-dCx, SVG-D RCA Systemic Hypertension Angiographically small aortic root/stent prosthetic valve.  Unable to cross.     RECOMMENDATIONS Discharge home after bedrest and hydration Consider 2D echocardiogram to assess for restenosis of aortic valve Pending echocardiographic evaluation, would consider non-anginal etiology -> with hypertension, could be related to diastolic dysfunction.     Bryan Lemma, MD   Echo 11/20/18:IMPRESSIONS      1. A 23mm an Edwards porcine bioprosthesis valve is present in the aortic position. Procedure Date: 03/30/15 Echo findings shows no evidence of regurgitation, perivalvular leak, rocking or dehiscence of the aortic prosthesis. Bioprosthetic leaflets not  well seen. Mean systolic gradient is 19 mmHg, no significant difference from gradient reported 1 month post operative in 04/2015.  2. The left ventricle has normal systolic function with an ejection fraction of 60-65%. The cavity size was normal. There is mildly increased left ventricular wall thickness. Left ventricular diastolic Doppler parameters are consistent with impaired  relaxation due to nondiagnostic images. Elevated left ventricular end-diastolic pressure The E/e' is 20.  3. The right ventricle has normal systolic function. The cavity was normal. There is no increase in right ventricular wall thickness. Right ventricular systolic pressure is mildly elevated with an estimated  pressure of 35.9 mmHg.  4. Left atrial size was mild-moderately dilated.  5. There is mild mitral annular calcification present.  6. There is mild dilatation of the ascending aorta measuring 40 mm.   Cardiac cath 12/03/21:  LEFT HEART CATH AND CORS/GRAFTS ANGIOGRAPHY  CORONARY/GRAFT ANGIOGRAPHY   Conclusion      Ost LAD to Prox LAD lesion is 80% stenosed.   Mid LAD lesion is 100% stenosed.   Ost Cx lesion is 80% stenosed.   Prox RCA to Mid RCA lesion is 100% stenosed.   Ost Ramus-1 lesion is 90% stenosed.   Ost Ramus-2 lesion is 60% stenosed.   Prox Cx-2 lesion is 100% stenosed.   Prox Cx-1 lesion is 80% stenosed.   LIMA and is normal in caliber.   SVG and is large.   SVG and is large.   SVG and is large.   The graft exhibits no disease.   The graft exhibits minimal luminal irregularities.   The graft exhibits minimal luminal irregularities.   The graft exhibits no disease.   3 vessel obstructive CAD.  All grafts are still patent No significant change from 2020.   Plan: recommend medical therapy. Some of his angina could be explained by disease in the native LCx proximal to a relatively small OM. Again this hasn't changed significantly.   Diagnostic Dominance: Right  Intervention   Echo  12/04/21: IMPRESSIONS     1. Left ventricular ejection fraction, by estimation, is 55 to 60%. The  left ventricle has normal function. The left ventricle has no regional  wall motion abnormalities. There is mild left ventricular hypertrophy.  Left ventricular diastolic parameters  were normal.   2. Right ventricular systolic function is mildly reduced. The right  ventricular size is mildly enlarged.   3. Left atrial size was moderately dilated.   4. Right atrial size was mildly dilated.   5. The mitral valve is abnormal. Mild mitral valve regurgitation. No  evidence of mitral stenosis. Moderate mitral annular calcification.   6. 23 mm Edwards porcine bioprosthetic valve no AR mean  gradient improved  since TTE done 11/1818 19.2-> 16.5 mmHg. The aortic valve has been  repaired/replaced. There is mild calcification of the aortic valve. There  is mild thickening of the aortic valve.   Aortic valve regurgitation is not visualized. Mild aortic valve stenosis.  There is a 23 mm Edwards porcine valve present in the aortic position.   7. The inferior vena cava is normal in size with greater than 50%  respiratory variability, suggesting right atrial pressure of 3 mmHg.   Echo 09/16/22: IMPRESSIONS     1. Left ventricular ejection fraction, by estimation, is 60 to 65%. The  left ventricle has normal function. The left ventricle has no regional  wall motion abnormalities. There is moderate left ventricular hypertrophy.  Left ventricular diastolic  parameters are consistent with Grade I diastolic dysfunction (impaired  relaxation).   2. Right ventricular systolic function is normal. The right ventricular  size is normal. There is normal pulmonary artery systolic pressure.   3. No evidence of mitral valve regurgitation.   4. S/p 23 mm Edwards Porcine AVR. V max 2.3 m/s. Mean gradient 12 mmHg.  DI 0.45. No PVL. Normal prosthesis. Aortic valve regurgitation is not  visualized.   5. Aortic aorta is not well visualized.   6. The inferior vena cava is dilated in size with >50% respiratory  variability, suggesting right atrial pressure of 8 mmHg.    ASSESSMENT AND PLAN:  1.  CAD s/p CABG in October 2016 following NSTEMI.   Myoview negative in May 2018. Cardiac cath in May 2020 showed all grafts were patent. He does have some atypical chest pressure. Not too concerned. Will monitor for any worsening symptoms.  Continue statin. Not on beta blocker due to bradycardia.  Let me know if angina increases in frequency or severity.  2. S/p bioprosthetic AVR for aortic stenosis. On routine SBE prophylaxis.  Echo April 2020 with good valve function.    3.  Hyperlipidemia. Satisfactory on  Crestor 5 mg daily.  Last LDL 36  4. HTN. With white coat syndrome. BP at home well controlled per wife who is a Engineer, civil (consulting).  Continue same meds. Not a candidate for beta blocker due to bradycardia, would avoid ACEi, ARB, aldactone with elevated potassium.  5. 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   6. Paroxysmal Afib. Maintaining NSR now. On Eliquis. Plan for ablation next week with eventual placement of Watchman device.   Follow up in 6 months      Jermiyah Ricotta [place] MD,FACC 03/29/2023 11:08 AM

## 2023-03-23 LAB — BASIC METABOLIC PANEL
BUN/Creatinine Ratio: 13 (ref 10–24)
BUN: 13 mg/dL (ref 8–27)
CO2: 22 mmol/L (ref 20–29)
Calcium: 9.4 mg/dL (ref 8.6–10.2)
Chloride: 103 mmol/L (ref 96–106)
Creatinine, Ser: 1.03 mg/dL (ref 0.76–1.27)
Glucose: 257 mg/dL — ABNORMAL HIGH (ref 70–99)
Potassium: 4.7 mmol/L (ref 3.5–5.2)
Sodium: 142 mmol/L (ref 134–144)
eGFR: 73 mL/min/{1.73_m2} (ref >59)

## 2023-03-23 LAB — CBC
Hematocrit: 41.7 % (ref 37.5–51.0)
Hemoglobin: 13.2 g/dL (ref 13.0–17.7)
MCH: 30.1 pg (ref 26.6–33.0)
MCHC: 31.7 g/dL (ref 31.5–35.7)
MCV: 95 fL (ref 79–97)
Platelets: 235 10*3/uL (ref 150–450)
RBC: 4.39 x10E6/uL (ref 4.14–5.80)
RDW: 13 % (ref 11.6–15.4)
WBC: 7.2 10*3/uL (ref 3.4–10.8)

## 2023-03-27 ENCOUNTER — Ambulatory Visit: Payer: Medicare Other | Admitting: Physician Assistant

## 2023-03-29 ENCOUNTER — Ambulatory Visit: Payer: Medicare Other | Attending: Cardiology | Admitting: Cardiology

## 2023-03-29 ENCOUNTER — Encounter: Payer: Self-pay | Admitting: Cardiology

## 2023-03-29 VITALS — BP 144/64 | HR 63 | Ht 68.0 in | Wt 202.4 lb

## 2023-03-29 DIAGNOSIS — E785 Hyperlipidemia, unspecified: Secondary | ICD-10-CM

## 2023-03-29 DIAGNOSIS — Z952 Presence of prosthetic heart valve: Secondary | ICD-10-CM | POA: Diagnosis not present

## 2023-03-29 DIAGNOSIS — Z951 Presence of aortocoronary bypass graft: Secondary | ICD-10-CM

## 2023-03-29 DIAGNOSIS — I480 Paroxysmal atrial fibrillation: Secondary | ICD-10-CM

## 2023-03-29 DIAGNOSIS — I25118 Atherosclerotic heart disease of native coronary artery with other forms of angina pectoris: Secondary | ICD-10-CM

## 2023-03-29 DIAGNOSIS — R079 Chest pain, unspecified: Secondary | ICD-10-CM

## 2023-03-29 NOTE — Patient Instructions (Signed)
Medication Instructions:  Continue all current medications *If you need a refill on your cardiac medications before your next appointment, please call your pharmacy*   Lab Work: none   Testing/Procedures: none   Follow-Up: At [facility], you and your health needs are our priority.  As part of our continuing mission to provide you with exceptional heart care, we have created designated Provider Care Teams.  These Care Teams include your primary Cardiologist (physician) and Advanced Practice Providers (APPs -  Physician Assistants and Nurse Practitioners) who all work together to provide you with the care you need, when you need it.  Your next appointment:   6 month(s). Call office in Jan 2025 to schedule appt for April [id number]  Provider:   Peter [place], MD

## 2023-03-30 ENCOUNTER — Ambulatory Visit: Payer: Medicare Other | Admitting: Podiatry

## 2023-03-30 ENCOUNTER — Encounter: Payer: Self-pay | Admitting: Podiatry

## 2023-03-30 ENCOUNTER — Telehealth (HOSPITAL_COMMUNITY): Payer: Self-pay | Admitting: Emergency Medicine

## 2023-03-30 VITALS — Ht 68.0 in | Wt 202.4 lb

## 2023-03-30 DIAGNOSIS — M79674 Pain in right toe(s): Secondary | ICD-10-CM | POA: Diagnosis not present

## 2023-03-30 DIAGNOSIS — M79675 Pain in left toe(s): Secondary | ICD-10-CM | POA: Diagnosis not present

## 2023-03-30 DIAGNOSIS — E119 Type 2 diabetes mellitus without complications: Secondary | ICD-10-CM | POA: Diagnosis not present

## 2023-03-30 DIAGNOSIS — B351 Tinea unguium: Secondary | ICD-10-CM

## 2023-03-30 NOTE — Telephone Encounter (Signed)
Attempted to call patient regarding upcoming cardiac CT appointment. °Left message on voicemail with name and callback number °Dwan Fennel RN Navigator Cardiac Imaging °[facility] °[phone] Office °[phone] Cell ° °

## 2023-03-30 NOTE — Progress Notes (Signed)

## 2023-03-31 ENCOUNTER — Ambulatory Visit (HOSPITAL_COMMUNITY)
Admission: RE | Admit: 2023-03-31 | Discharge: 2023-03-31 | Disposition: A | Discharge status: Home / Self Care | Payer: Medicare Other | Source: Ambulatory Visit | Attending: Cardiology | Admitting: Cardiology

## 2023-03-31 DIAGNOSIS — Z952 Presence of prosthetic heart valve: Secondary | ICD-10-CM | POA: Diagnosis not present

## 2023-03-31 DIAGNOSIS — I480 Paroxysmal atrial fibrillation: Secondary | ICD-10-CM | POA: Insufficient documentation

## 2023-03-31 DIAGNOSIS — I2510 Atherosclerotic heart disease of native coronary artery without angina pectoris: Secondary | ICD-10-CM | POA: Diagnosis not present

## 2023-03-31 DIAGNOSIS — I25118 Atherosclerotic heart disease of native coronary artery with other forms of angina pectoris: Secondary | ICD-10-CM | POA: Insufficient documentation

## 2023-03-31 DIAGNOSIS — Z951 Presence of aortocoronary bypass graft: Secondary | ICD-10-CM | POA: Diagnosis not present

## 2023-03-31 MED ORDER — IOHEXOL 350 MG/ML SOLN
100.0000 mL | Freq: Once | INTRAVENOUS | Status: AC | PRN
  Administered 2023-03-31: 100 mL via INTRAVENOUS

## 2023-04-06 NOTE — Pre-Procedure Instructions (Addendum)
Spoke with wife Troy Banks on the following items: Arrival time 0515 Nothing to eat or drink after midnight No meds AM of procedure Responsible person to drive you home and stay with you for 24 hrs  Have you missed any doses of anti-coagulant Eliquis- takes twice a day, hasn't missed any doses.  Don't take dose in the morning

## 2023-04-07 ENCOUNTER — Other Ambulatory Visit: Payer: Self-pay

## 2023-04-07 ENCOUNTER — Ambulatory Visit (HOSPITAL_COMMUNITY)
Admission: RE | Admit: 2023-04-07 | Discharge: 2023-04-07 | Disposition: A | Discharge status: Home / Self Care | Payer: Medicare Other | Attending: Cardiology | Admitting: Cardiology

## 2023-04-07 ENCOUNTER — Ambulatory Visit (HOSPITAL_COMMUNITY): Payer: Medicare Other | Admitting: Anesthesiology

## 2023-04-07 ENCOUNTER — Other Ambulatory Visit (HOSPITAL_COMMUNITY): Payer: Self-pay

## 2023-04-07 ENCOUNTER — Encounter (HOSPITAL_COMMUNITY)
Admission: RE | Disposition: A | Discharge status: Home / Self Care | Payer: Self-pay | Source: Home / Self Care | Attending: Cardiology

## 2023-04-07 ENCOUNTER — Ambulatory Visit (HOSPITAL_BASED_OUTPATIENT_CLINIC_OR_DEPARTMENT_OTHER): Payer: Medicare Other | Admitting: Anesthesiology

## 2023-04-07 DIAGNOSIS — I4891 Unspecified atrial fibrillation: Secondary | ICD-10-CM | POA: Diagnosis not present

## 2023-04-07 DIAGNOSIS — I4819 Other persistent atrial fibrillation: Secondary | ICD-10-CM | POA: Diagnosis not present

## 2023-04-07 DIAGNOSIS — I252 Old myocardial infarction: Secondary | ICD-10-CM | POA: Insufficient documentation

## 2023-04-07 DIAGNOSIS — E119 Type 2 diabetes mellitus without complications: Secondary | ICD-10-CM | POA: Insufficient documentation

## 2023-04-07 DIAGNOSIS — I25118 Atherosclerotic heart disease of native coronary artery with other forms of angina pectoris: Secondary | ICD-10-CM | POA: Insufficient documentation

## 2023-04-07 DIAGNOSIS — Z952 Presence of prosthetic heart valve: Secondary | ICD-10-CM | POA: Insufficient documentation

## 2023-04-07 DIAGNOSIS — Z951 Presence of aortocoronary bypass graft: Secondary | ICD-10-CM | POA: Diagnosis not present

## 2023-04-07 DIAGNOSIS — G250 Essential tremor: Secondary | ICD-10-CM | POA: Insufficient documentation

## 2023-04-07 DIAGNOSIS — F17210 Nicotine dependence, cigarettes, uncomplicated: Secondary | ICD-10-CM

## 2023-04-07 DIAGNOSIS — I350 Nonrheumatic aortic (valve) stenosis: Secondary | ICD-10-CM | POA: Insufficient documentation

## 2023-04-07 DIAGNOSIS — I483 Typical atrial flutter: Secondary | ICD-10-CM | POA: Insufficient documentation

## 2023-04-07 DIAGNOSIS — I5032 Chronic diastolic (congestive) heart failure: Secondary | ICD-10-CM | POA: Diagnosis not present

## 2023-04-07 DIAGNOSIS — I110 Hypertensive heart disease with heart failure: Secondary | ICD-10-CM | POA: Diagnosis not present

## 2023-04-07 DIAGNOSIS — Z7901 Long term (current) use of anticoagulants: Secondary | ICD-10-CM | POA: Diagnosis not present

## 2023-04-07 DIAGNOSIS — G4730 Sleep apnea, unspecified: Secondary | ICD-10-CM | POA: Insufficient documentation

## 2023-04-07 DIAGNOSIS — E785 Hyperlipidemia, unspecified: Secondary | ICD-10-CM | POA: Diagnosis not present

## 2023-04-07 DIAGNOSIS — I4892 Unspecified atrial flutter: Secondary | ICD-10-CM | POA: Diagnosis not present

## 2023-04-07 HISTORY — PX: ATRIAL FIBRILLATION ABLATION: EP1191

## 2023-04-07 LAB — GLUCOSE, CAPILLARY
Glucose-Capillary: 155 mg/dL — ABNORMAL HIGH (ref 70–99)
Glucose-Capillary: 195 mg/dL — ABNORMAL HIGH (ref 70–99)

## 2023-04-07 LAB — POCT ACTIVATED CLOTTING TIME: Activated Clotting Time: 302 s

## 2023-04-07 SURGERY — ATRIAL FIBRILLATION ABLATION
Anesthesia: General

## 2023-04-07 MED ORDER — ACETAMINOPHEN 325 MG PO TABS
650.0000 mg | ORAL_TABLET | ORAL | Status: DC | PRN
  Administered 2023-04-07: 650 mg via ORAL
  Filled 2023-04-07: qty 2

## 2023-04-07 MED ORDER — SODIUM CHLORIDE 0.9 % IV SOLN: INTRAVENOUS | Status: DC

## 2023-04-07 MED ORDER — PROPOFOL 10 MG/ML IV BOLUS
INTRAVENOUS | Status: DC | PRN
  Administered 2023-04-07: 50 ug/kg/min via INTRAVENOUS
  Administered 2023-04-07: 150 mg via INTRAVENOUS
  Administered 2023-04-07: 60 mg via INTRAVENOUS

## 2023-04-07 MED ORDER — LIDOCAINE 2% (20 MG/ML) 5 ML SYRINGE
INTRAMUSCULAR | Status: DC | PRN
  Administered 2023-04-07: 60 mg via INTRAVENOUS

## 2023-04-07 MED ORDER — HEPARIN SODIUM (PORCINE) 1000 UNIT/ML IJ SOLN
INTRAMUSCULAR | Status: DC | PRN
  Administered 2023-04-07: 14000 [IU] via INTRAVENOUS
  Administered 2023-04-07: 4000 [IU] via INTRAVENOUS

## 2023-04-07 MED ORDER — APIXABAN 5 MG PO TABS
5.0000 mg | ORAL_TABLET | Freq: Two times a day (BID) | ORAL | Status: DC
  Administered 2023-04-07: 5 mg via ORAL
  Filled 2023-04-07: qty 1

## 2023-04-07 MED ORDER — HEPARIN SODIUM (PORCINE) 1000 UNIT/ML IJ SOLN
INTRAMUSCULAR | Status: AC
  Filled 2023-04-07: qty 10

## 2023-04-07 MED ORDER — DEXMEDETOMIDINE HCL IN NACL 80 MCG/20ML IV SOLN
INTRAVENOUS | Status: DC | PRN
Start: 2023-04-07 — End: 2023-04-07
  Administered 2023-04-07 (×2): 4 ug via INTRAVENOUS

## 2023-04-07 MED ORDER — PANTOPRAZOLE SODIUM 40 MG PO TBEC
40.0000 mg | DELAYED_RELEASE_TABLET | Freq: Every day | ORAL | Status: DC
  Administered 2023-04-07: 40 mg via ORAL
  Filled 2023-04-07: qty 1

## 2023-04-07 MED ORDER — ATROPINE SULFATE 1 MG/10ML IJ SOSY
PREFILLED_SYRINGE | INTRAMUSCULAR | Status: AC
  Filled 2023-04-07: qty 10

## 2023-04-07 MED ORDER — PHENYLEPHRINE HCL-NACL 20-0.9 MG/250ML-% IV SOLN
INTRAVENOUS | Status: DC | PRN
  Administered 2023-04-07: 50 ug/min via INTRAVENOUS

## 2023-04-07 MED ORDER — PROTAMINE SULFATE 10 MG/ML IV SOLN
INTRAVENOUS | Status: DC | PRN
  Administered 2023-04-07: 35 mg via INTRAVENOUS

## 2023-04-07 MED ORDER — SUCCINYLCHOLINE CHLORIDE 200 MG/10ML IV SOSY
PREFILLED_SYRINGE | INTRAVENOUS | Status: DC | PRN
  Administered 2023-04-07: 120 mg via INTRAVENOUS

## 2023-04-07 MED ORDER — SODIUM CHLORIDE 0.9 % IV SOLN
250.0000 mL | INTRAVENOUS | Status: DC | PRN
Start: 2023-04-07 — End: 2023-04-07

## 2023-04-07 MED ORDER — SODIUM CHLORIDE 0.9% FLUSH: 3.0000 mL | Freq: Two times a day (BID) | INTRAVENOUS | Status: DC

## 2023-04-07 MED ORDER — PANTOPRAZOLE SODIUM 40 MG PO TBEC
40.0000 mg | DELAYED_RELEASE_TABLET | Freq: Every day | ORAL | 0 refills | Status: DC
  Filled 2023-04-07: qty 45, 45d supply, fill #0

## 2023-04-07 MED ORDER — HEPARIN SODIUM (PORCINE) 1000 UNIT/ML IJ SOLN
INTRAMUSCULAR | Status: DC | PRN
  Administered 2023-04-07: 1000 [IU] via INTRAVENOUS

## 2023-04-07 MED ORDER — DEXAMETHASONE SODIUM PHOSPHATE 10 MG/ML IJ SOLN
INTRAMUSCULAR | Status: DC | PRN
  Administered 2023-04-07: 5 mg via INTRAVENOUS

## 2023-04-07 MED ORDER — PROTAMINE SULFATE 10 MG/ML IV SOLN
INTRAVENOUS | Status: AC
  Filled 2023-04-07: qty 5

## 2023-04-07 MED ORDER — SODIUM CHLORIDE 0.9% FLUSH
3.0000 mL | INTRAVENOUS | Status: DC | PRN
Start: 2023-04-07 — End: 2023-04-07

## 2023-04-07 MED ORDER — HEPARIN (PORCINE) IN NACL 1000-0.9 UT/500ML-% IV SOLN
INTRAVENOUS | Status: DC | PRN
  Administered 2023-04-07 (×3): 500 mL

## 2023-04-07 MED ORDER — ATROPINE SULFATE 1 MG/10ML IJ SOSY
PREFILLED_SYRINGE | INTRAMUSCULAR | Status: DC | PRN
  Administered 2023-04-07: 1 mg via INTRAVENOUS

## 2023-04-07 SURGICAL SUPPLY — 23 items
BAG SNAP BAND KOVER 36X36 (MISCELLANEOUS) ×1 IMPLANT
CABLE PFA RX CATH CONN (CABLE) ×1 IMPLANT
CATH 8FR REPROCESSED SOUNDSTAR (CATHETERS) ×1 IMPLANT
CATH 8FR SOUNDSTAR REPROCESSED (CATHETERS) IMPLANT
CATH FARAWAVE ABLATION 31 (CATHETERS) ×1 IMPLANT
CATH OCTARAY 2.0 F 3-3-3-3-3 (CATHETERS) ×1 IMPLANT
CATH SMTCH THERMOCOOL SF DF (CATHETERS) ×1 IMPLANT
CATH WEB BI DIR CSDF CRV REPRO (CATHETERS) ×1 IMPLANT
CLOSURE PERCLOSE PROSTYLE (VASCULAR PRODUCTS) ×4 IMPLANT
COVER SWIFTLINK CONNECTOR (BAG) ×2 IMPLANT
DILATOR VESSEL 38 20CM 16FR (INTRODUCER) ×1 IMPLANT
GUIDEWIRE INQWIRE 1.5J.035X260 (WIRE) IMPLANT
INQWIRE 1.5J .035X260CM (WIRE) ×1
PACK EP LATEX FREE (CUSTOM PROCEDURE TRAY) ×1
PACK EP LF (CUSTOM PROCEDURE TRAY) ×2 IMPLANT
PAD DEFIB RADIO PHYSIO CONN (PAD) ×2 IMPLANT
PATCH CARTO3 (PAD) ×1 IMPLANT
SHEATH FARADRIVE STEERABLE (SHEATH) ×1 IMPLANT
SHEATH PINNACLE 8F 10CM (SHEATH) ×2 IMPLANT
SHEATH PINNACLE 9F 10CM (SHEATH) ×1 IMPLANT
SHEATH PROBE COVER 6X72 (BAG) ×1 IMPLANT
SHEATH WIRE KIT BAYLIS SL1 (KITS) ×1 IMPLANT
TUBING SMART ABLATE COOLFLOW (TUBING) ×1 IMPLANT

## 2023-04-07 NOTE — Discharge Instructions (Signed)

## 2023-04-07 NOTE — Anesthesia Postprocedure Evaluation (Signed)
Anesthesia Post Note  Patient: Troy Banks  Procedure(s) Performed: ATRIAL FIBRILLATION ABLATION     Patient location during evaluation: PACU Anesthesia Type: General Level of consciousness: awake and alert, oriented and patient cooperative Pain management: pain level controlled Vital Signs Assessment: post-procedure vital signs reviewed and stable Respiratory status: spontaneous breathing, nonlabored ventilation and respiratory function stable Cardiovascular status: blood pressure returned to baseline and stable Postop Assessment: no apparent nausea or vomiting Anesthetic complications: no   No notable events documented.  Last Vitals:  Vitals:   04/07/23 1100 04/07/23 1130  BP: 113/63 (!) 154/83  Pulse: (!) 55 (!) 58  Resp: 19 18  Temp:    SpO2: 93% 94%    Last Pain:  Vitals:   04/07/23 1042  TempSrc:   PainSc: 6                  Lannie Fields

## 2023-04-07 NOTE — Progress Notes (Signed)
Patient walked to the bathroom without difficulties. Bilateral groin sites level 0, clean, dry, and intact.

## 2023-04-07 NOTE — Anesthesia Preprocedure Evaluation (Addendum)
Anesthesia Evaluation  Patient identified by MRN, date of birth, ID band Patient awake    Reviewed: Allergy & Precautions, H&P , NPO status , Patient's Chart, lab work & pertinent test results  History of Anesthesia Complications (+) DIFFICULT AIRWAY, AWARENESS UNDER ANESTHESIA, PROLONGED EMERGENCE, Emergence Delirium and history of anesthetic complications  Airway Mallampati: IV  TM Distance: >3 FB Neck ROM: Full    Dental  (+) Teeth Intact, Dental Advisory Given   Pulmonary sleep apnea and Continuous Positive Airway Pressure Ventilation , Current Smoker   Pulmonary exam normal breath sounds clear to auscultation       Cardiovascular hypertension (153/71 preop), Pt. on medications + CAD, + Past MI, + CABG (CABG/AVR 2016) and +CHF  + dysrhythmias Atrial Fibrillation + Valvular Problems/Murmurs (s/p AVR 2016)  Rhythm:Irregular Rate:Normal  Echo 09/2022 1. Left ventricular ejection fraction, by estimation, is 60 to 65%. The  left ventricle has normal function. The left ventricle has no regional  wall motion abnormalities. There is moderate left ventricular hypertrophy.  Left ventricular diastolic  parameters are consistent with Grade I diastolic dysfunction (impaired  relaxation).   2. Right ventricular systolic function is normal. The right ventricular  size is normal. There is normal pulmonary artery systolic pressure.   3. No evidence of mitral valve regurgitation.   4. S/p 23 mm Edwards Porcine AVR. V max 2.3 m/s. Mean gradient 12 mmHg.  DI 0.45. No PVL. Normal prosthesis. Aortic valve regurgitation is not  visualized.   5. Aortic aorta is not well visualized.   6. The inferior vena cava is dilated in size with >50% respiratory  variability, suggesting right atrial pressure of 8 mmHg.   Cath 2023 1. 3 vessel obstructive CAD.  2. All grafts are still patent 3. No significant change from 2020.      Neuro/Psych   Headaches PSYCHIATRIC DISORDERS Anxiety Depression    Memory loss tremor   GI/Hepatic Neg liver ROS,GERD  Controlled,,  Endo/Other  diabetes, Well Controlled, Type 2, Oral Hypoglycemic AgentsHypothyroidism  Obesity BMI 31  Renal/GU negative Renal ROS  negative genitourinary   Musculoskeletal  (+) Arthritis , Osteoarthritis,    Abdominal  (+) + obese  Peds negative pediatric ROS (+)  Hematology negative hematology ROS (+)   Anesthesia Other Findings   Reproductive/Obstetrics negative OB ROS                             Anesthesia Physical Anesthesia Plan  ASA: 3  Anesthesia Plan: General   Post-op Pain Management:    Induction: Intravenous  PONV Risk Score and Plan: Treatment may vary due to age or medical condition and Dexamethasone  Airway Management Planned: Oral ETT  Additional Equipment: None  Intra-op Plan:   Post-operative Plan: Extubation in OR  Informed Consent: I have reviewed the patients History and Physical, chart, labs and discussed the procedure including the risks, benefits and alternatives for the proposed anesthesia with the patient or authorized representative who has indicated his/her understanding and acceptance.     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

## 2023-04-07 NOTE — Progress Notes (Signed)
Patients right groin noticed to have some oozing, pressure held for 15 minutes, gauze dressing changed. Right groin level 0, clean, dry, and intact.

## 2023-04-07 NOTE — Progress Notes (Signed)
  Pt arrived from EP via Bed to HA. Report received from RN and CRNA. Pt vitals are stable, performed q69min see vitals flowsheet. Anesthesia recovery has been majorly uneventful, pt needs constant redirection for keeping legs straight and staying in bed. Short stay aware and will bring pt wife to bedside. Pt to transition to short stay for remainder of recovery. Will continue to monitor patient while under holding area care.

## 2023-04-07 NOTE — Transfer of Care (Signed)
Immediate Anesthesia Transfer of Care Note  Patient: Troy Banks  Procedure(s) Performed: ATRIAL FIBRILLATION ABLATION  Patient Location: PACU and Cath Lab  Anesthesia Type:General  Level of Consciousness: sedated  Airway & Oxygen Therapy: Patient Spontanous Breathing and Patient connected to face mask oxygen  Post-op Assessment: Report given to RN and Post -op Vital signs reviewed and stable  Post vital signs: Reviewed and stable  Last Vitals:  Vitals Value Taken Time  BP 111/68 04/07/23 1000  Temp    Pulse 59 04/07/23 1003  Resp 23 04/07/23 1003  SpO2 97 % 04/07/23 1003  Vitals shown include unfiled device data.  Last Pain:  Vitals:   04/07/23 0557  TempSrc: Oral  PainSc: 0-No pain         Complications: No notable events documented.

## 2023-04-07 NOTE — Anesthesia Procedure Notes (Signed)
Procedure Name: Intubation Date/Time: 04/07/2023 7:38 AM  Performed by: Susy Manor, CRNAPre-anesthesia Checklist: Patient identified, Emergency Drugs available, Suction available and Patient being monitored Patient Re-evaluated:Patient Re-evaluated prior to induction Oxygen Delivery Method: Circle System Utilized Preoxygenation: Pre-oxygenation with 100% oxygen Induction Type: IV induction Ventilation: Mask ventilation without difficulty Laryngoscope Size: Glidescope and 4 Grade View: Grade I Tube type: Oral Tube size: 7.5 mm Number of attempts: 1 Airway Equipment and Method: Stylet and Oral airway Placement Confirmation: ETT inserted through vocal cords under direct vision, positive ETCO2 and breath sounds checked- equal and bilateral Tube secured with: Tape Dental Injury: Teeth and Oropharynx as per pre-operative assessment

## 2023-04-07 NOTE — H&P (Signed)
Electrophysiology Office Note:     Date:  04/07/2023    ID:  Troy Banks, DOB [birth year], MRN [id number]   [facility] Cardiologist:  Peter Swaziland, MD  [facility] Electrophysiologist:  Lanier Prude, MD    Referring MD: Corwin Levins, MD    Chief Complaint: Atrial fibrillation   History of Present Illness:     Troy Banks is a 82 y.o. male who I am seeing today for an opinion regarding atrial fibrillation at the request of Dr. Eden Emms.  The patient has a history of coronary artery disease post CABG in 2016, aortic stenosis post AVR in 2016 and atrial fibrillation flutter since 2016 on amiodarone.  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   He was hospitalized in April 2024 with A-fib with RVR.  The patient saw Troy Banks on September 27, 2022.  The patient previously took primidone for his essential tremor but this was stopped due to drug interactions with his DOAC.  The patient has previously declined Coumadin.   He is with his wife today in clinic who is a retired Charity fundraiser from [employer]. The patient's AF episodes are rare but highly symptomatic.   Presents for PVI today.      Objective Their past medical, social and family history was reveiwed.     ROS:   Please see the history of present illness.    All other systems reviewed and are negative.   EKGs/Labs/Other Studies Reviewed:     The following studies were reviewed today:   April 2024 echo EF 60 to 65% RV function normal Aortic valve prosthesis functioning appropriately       Physical Exam:     VS:  BP 153/71   Pulse 60   Ht 5\' 8"  (1.727 m)   Wt 198 lb 3.2 oz (89.9 kg)   SpO2 96%   BMI 30.14 kg/m         Wt Readings from Last 3 Encounters:  11/25/22 198 lb 3.2 oz (89.9 kg)  11/16/22 200 lb (90.7 kg)  11/04/22 200 lb (90.7 kg)      GEN:  Well nourished, well developed in no acute  distress CARDIAC: RRR, no murmurs, rubs, gallops RESPIRATORY:  Clear to auscultation without rales, wheezing or rhonchi          Assessment ASSESSMENT AND PLAN:     1. Paroxysmal atrial fibrillation (HCC)   2. Coronary artery disease of native artery of native heart with stable angina pectoris (HCC)   3. S/P CABG x 4   4. S/P AVR       #Persistent atrial fibrillation and flutter Symptomatic.  Led to recent hospitalization. He is now taking a stable anticoagulant with Eliquis. I discussed treatment options for his atrial fibrillation and stroke risk mitigation strategies during today's clinic appointment.  I discussed atrial fibrillation and flutter ablation in detail including the risks, success rates and recovery.  His antiarrhythmic drug options are limited given his other medications.  He would like to proceed with catheter ablation which I think is reasonable.   Discussed treatment options today for AF and AFL including antiarrhythmic drug therapy and ablation. Discussed risks, recovery and likelihood of success with each treatment strategy. Risk, benefits, and alternatives to EP study and radiofrequency ablation for afib were discussed. These risks include but are not limited to stroke, bleeding, vascular damage, tamponade, perforation, damage to the esophagus, lungs, phrenic  nerve and other structures, pulmonary vein stenosis, worsening renal function, and death.  Discussed potential need for repeat ablation procedures and antiarrhythmic drugs after an initial ablation. The patient understands these risk and wishes to proceed.  We will therefore proceed with catheter ablation at the next available time.  Carto, ICE, anesthesia are requested for the procedure.  Will also obtain CT PV protocol prior to the procedure to exclude LAA thrombus and further evaluate atrial anatomy.   ---------------------   I have seen Troy Banks in the office today who is being considered for a  Watchman left atrial appendage closure device. I believe they will benefit from this procedure given their history of atrial fibrillation, CHA2DS2-VASc score of 8 and unadjusted ischemic stroke rate of 10.8% per year. Unfortunately, the patient is not felt to be a long term anticoagulation candidate secondary to history of CYP mutation, drug-drug interactions. The patient's chart has been reviewed and I feel that they would be a candidate for short term oral anticoagulation after Watchman implant.    It is my belief that after undergoing a LAA closure procedure, Troy Banks will not need long term anticoagulation which eliminates anticoagulation side effects and major bleeding risk.    Procedural risks for the Watchman implant have been reviewed with the patient including a 0.5% risk of stroke, <1% risk of perforation and <1% risk of device embolization. Other risks include bleeding, vascular damage, tamponade, worsening renal function, and death. The patient understands these risk and wishes to proceed.       The published clinical data on the safety and effectiveness of WATCHMAN include but are not limited to the following: - Holmes DR, Everlene Farrier, Sick P et al. for the PROTECT AF Investigators. Percutaneous closure of the left atrial appendage versus warfarin therapy for prevention of stroke in patients with atrial fibrillation: a randomised non-inferiority trial. Lancet 2009; 374: 534-42. Everlene Farrier, Doshi SK, Isa Rankin D et al. on behalf of the PROTECT AF Investigators. Percutaneous Left Atrial Appendage Closure for Stroke Prophylaxis in Patients With Atrial Fibrillation 2.3-Year Follow-up of the PROTECT AF (Watchman Left Atrial Appendage System for Embolic Protection in Patients With Atrial Fibrillation) Trial. Circulation 2013; 127:720-729. - Alli O, Doshi S,  Kar S, Reddy VY, Sievert H et al. Quality of Life Assessment in the Randomized PROTECT AF (Percutaneous Closure of the Left  Atrial Appendage Versus Warfarin Therapy for Prevention of Stroke in Patients With Atrial Fibrillation) Trial of Patients at Risk for Stroke With Nonvalvular Atrial Fibrillation. J Am Coll Cardiol 2013; 61:1790-8. Aline August DR, Mia Creek, Price M, Whisenant B, Sievert H, Doshi S, Huber K, Reddy V. Prospective randomized evaluation of the Watchman left atrial appendage Device in patients with atrial fibrillation versus long-term warfarin therapy; the PREVAIL trial. Journal of the [employer] of Cardiology, Vol. 4, No. 1, 2014, 1-11. - Kar S, Doshi SK, Sadhu A, Horton R, Osorio J et al. Primary outcome evaluation of a next-generation left atrial appendage closure device: results from the PINNACLE FLX trial. Circulation 2021;143(18)1754-1762.      After today's visit with the patient which was dedicated solely for shared decision making visit regarding LAA closure device, the patient decided to proceed with the LAA appendage closure procedure scheduled to be done in the near future at [facility]. Prior to the procedure, I would like to obtain a gated CT scan of the chest with contrast timed for PV/LA visualization.      HAS-BLED  score 4 Hypertension Yes  Abnormal renal and liver function (Dialysis, transplant, Cr >2.26 mg/dL /Cirrhosis or Bilirubin >2x Normal or AST/ALT/AP >3x Normal) No  Stroke Yes  Bleeding No  Labile INR (Unstable/high INR) No  Elderly (>65) Yes  Drugs or alcohol (>= 8 drinks/week, anti-plt or NSAID) Yes    CHA2DS2-VASc Score = 8  The patient's score is based upon: CHF History: 1 HTN History: 1 Diabetes History: 1 Stroke History: 2 Vascular Disease History: 1 Age Score: 2 Gender Score: 0      Presents for PVI today. Procedure reviewed.     Signed, Rossie Muskrat. Lalla Brothers, MD, [facility], [facility] 04/07/2023 Electrophysiology [facility]

## 2023-04-10 ENCOUNTER — Encounter (HOSPITAL_COMMUNITY): Payer: Self-pay | Admitting: Cardiology

## 2023-04-10 ENCOUNTER — Telehealth: Payer: Self-pay

## 2023-04-10 NOTE — Telephone Encounter (Signed)
  Troy Banks: Chicken wing anatomy on a rotated heart. Max 24/ AVG 22/ Depth 16.3 Likely use a 27mm device and double curve sheath Inf/Mid TSP  RAO 0 CRA 18

## 2023-04-17 ENCOUNTER — Other Ambulatory Visit: Payer: Self-pay

## 2023-04-17 ENCOUNTER — Telehealth: Payer: Self-pay

## 2023-04-17 DIAGNOSIS — I480 Paroxysmal atrial fibrillation: Secondary | ICD-10-CM

## 2023-04-17 NOTE — Telephone Encounter (Signed)
-----   Message from Lanier Prude sent at 04/02/2023  9:13 AM EDT ----- OK to proceed with watchman evaluation.  Sheria Lang T. Lalla Brothers, MD, [facility] Cardiac Electrophysiology

## 2023-04-17 NOTE — Telephone Encounter (Signed)
Reviewed results with patient's wife (DPR) who verbalized understanding.   They wish to proceed with LAAO on 07/13/2023. He is scheduled to see Canary Brim 06/27/2023 for 3 month ablation check-up which will also serve as pre-Watchman visit. She was grateful for call and agreed with plan.

## 2023-04-24 ENCOUNTER — Telehealth: Payer: Self-pay

## 2023-05-01 ENCOUNTER — Other Ambulatory Visit: Payer: Self-pay | Admitting: Cardiology

## 2023-05-08 ENCOUNTER — Ambulatory Visit (HOSPITAL_COMMUNITY)
Admission: RE | Admit: 2023-05-08 | Discharge: 2023-05-08 | Disposition: A | Discharge status: Home / Self Care | Payer: Medicare Other | Source: Ambulatory Visit | Attending: Physician Assistant | Admitting: Physician Assistant

## 2023-05-08 VITALS — BP 142/60 | HR 65 | Ht 68.0 in | Wt 201.0 lb

## 2023-05-08 DIAGNOSIS — I110 Hypertensive heart disease with heart failure: Secondary | ICD-10-CM | POA: Insufficient documentation

## 2023-05-08 DIAGNOSIS — I2510 Atherosclerotic heart disease of native coronary artery without angina pectoris: Secondary | ICD-10-CM | POA: Diagnosis not present

## 2023-05-08 DIAGNOSIS — E785 Hyperlipidemia, unspecified: Secondary | ICD-10-CM | POA: Diagnosis not present

## 2023-05-08 DIAGNOSIS — Z7901 Long term (current) use of anticoagulants: Secondary | ICD-10-CM | POA: Diagnosis not present

## 2023-05-08 DIAGNOSIS — G4733 Obstructive sleep apnea (adult) (pediatric): Secondary | ICD-10-CM | POA: Diagnosis not present

## 2023-05-08 DIAGNOSIS — I5032 Chronic diastolic (congestive) heart failure: Secondary | ICD-10-CM | POA: Insufficient documentation

## 2023-05-08 DIAGNOSIS — E119 Type 2 diabetes mellitus without complications: Secondary | ICD-10-CM | POA: Diagnosis not present

## 2023-05-08 DIAGNOSIS — I4891 Unspecified atrial fibrillation: Secondary | ICD-10-CM | POA: Diagnosis not present

## 2023-05-08 DIAGNOSIS — I480 Paroxysmal atrial fibrillation: Secondary | ICD-10-CM | POA: Diagnosis not present

## 2023-05-08 DIAGNOSIS — D6869 Other thrombophilia: Secondary | ICD-10-CM | POA: Diagnosis not present

## 2023-05-08 DIAGNOSIS — Z8673 Personal history of transient ischemic attack (TIA), and cerebral infarction without residual deficits: Secondary | ICD-10-CM | POA: Diagnosis not present

## 2023-05-08 NOTE — Progress Notes (Signed)
Primary Care Physician: Corwin Levins, MD Primary Cardiologist: Peter Swaziland, MD Electrophysiologist: Lanier Prude, MD     Referring Physician: Dr. Colon Flattery is a 82 y.o. male with a history of CAD s/p CABG in 2016, CVA, aortic stenosis s/p AVR in 2016, chronic diastolic HF, HTN, HLD, T2DM, OSA, and paroxysmal atrial fibrillation who presents for consultation in the [facility].  S/p Afib ablation on 04/07/23 by Dr. Lalla Brothers. Scheduled for Watchman 07/2023. Patient is on Eliquis 5 mg BID for a CHADS2VASC score of 8.  On evaluation today, he is currently in NSR. No episodes of Afib since ablation. No chest pain, SOB, or trouble swallowing. Leg sites healed without issue. No missed doses of anticoagulant.  Today, he denies symptoms of orthopnea, PND, lower extremity edema, dizziness, presyncope, syncope, snoring, daytime somnolence, bleeding, or neurologic sequela. The patient is tolerating medications without difficulties and is otherwise without complaint today.    he has a BMI of Body mass index is 30.56 kg/m.[name] Filed Weights   05/08/23 1041  Weight: 91.2 kg    Current Outpatient Medications  Medication Sig Dispense Refill   allopurinol (ZYLOPRIM) 300 MG tablet TAKE 1 TABLET BY MOUTH EVERY DAY 90 tablet 3   amLODipine (NORVASC) 10 MG tablet TAKE 1 TABLET BY MOUTH EVERY DAY 90 tablet 3   apixaban (ELIQUIS) 5 MG TABS tablet Take 1 tablet (5 mg total) by mouth 2 (two) times daily. 180 tablet 3   Ascorbic Acid (VITAMIN C) 1000 MG tablet Take 1,000 mg by mouth daily.     aspirin EC 81 MG tablet Take 81 mg by mouth every other day. Swallow whole.     Carboxymethylcellulose Sodium (LUBRICANT EYE DROPS OP) Place 1 drop into both eyes 4 (four) times daily as needed (dry/irritated eyes.).     Cholecalciferol (VITAMIN D-3) 25 MCG (1000 UT) CAPS Take 1,000 Units by mouth every other day.     diclofenac Sodium (VOLTAREN) 1 % GEL Apply 2 g  topically as needed.     diltiazem (CARDIZEM CD) 240 MG 24 hr capsule TAKE 1 CAPSULE BY MOUTH DAILY 90 capsule 3   furosemide (LASIX) 40 MG tablet TAKE 1 TABLET BY MOUTH EVERY OTHER DAY 45 tablet 2   isosorbide mononitrate (IMDUR) 60 MG 24 hr tablet Take 1 tablet (60 mg total) by mouth daily. 90 tablet 3   levothyroxine (SYNTHROID) 25 MCG tablet TAKE 1 TABLET BY MOUTH EVERY MORNING WITH BREAKFAST (Patient taking differently: Take 25 mcg by mouth every other day.) 90 tablet 3   nitroGLYCERIN (NITROSTAT) 0.4 MG SL tablet PLACE 1 TABLET UNDER THE TONGUE EVERY 5 MINUTES AS NEEDED FOR CHEST PAIN. 25 tablet 3   NON FORMULARY Pt uses a cpap nightly     pantoprazole (PROTONIX) 40 MG tablet Take 1 tablet (40 mg total) by mouth daily. Does not need refill after 45 days. 45 tablet 0   rosuvastatin (CRESTOR) 10 MG tablet TAKE 1/2 TABLET BY MOUTH EVERY DAY (Patient taking differently: Take 5 mg by mouth every other day.) 45 tablet 2   tiZANidine (ZANAFLEX) 2 MG tablet Take 2 mg by mouth daily as needed for muscle spasms.     Turmeric 500 MG CAPS Take 500 mg by mouth daily.      vitamin B-12 (CYANOCOBALAMIN) 1000 MCG tablet Take 1,000 mcg by mouth every other day.     famotidine (PEPCID) 40 MG tablet Take 1 tablet (  40 mg total) by mouth daily. (Patient not taking: Reported on 05/08/2023) 90 tablet 3   No current facility-administered medications for this encounter.    Atrial Fibrillation Management history:  Previous antiarrhythmic drugs: amiodarone Previous cardioversions: none Previous ablations: 04/07/23 Anticoagulation history: Eliquis   ROS- All systems are reviewed and negative except as per the HPI above.  Physical Exam: BP (!) 142/60   Pulse 65   Ht 5\' 8"  (1.727 m)   Wt 91.2 kg   BMI 30.56 kg/m   GEN: Well nourished, well developed in no acute distress NECK: No JVD; No carotid bruits CARDIAC: Regular rate and rhythm, no murmurs, rubs, gallops RESPIRATORY:  Clear to auscultation without  rales, wheezing or rhonchi  ABDOMEN: Soft, non-tender, non-distended EXTREMITIES:  No edema; No deformity   EKG today demonstrates  Vent. rate 65 BPM PR interval 226 ms QRS duration 78 ms QT/QTcB 406/422 ms P-R-T axes 59 69 49 Sinus rhythm with 1st degree A-V block with Premature atrial complexes with Abberant conduction Nonspecific ST abnormality Abnormal ECG When compared with ECG of 07-Apr-2023 10:06, PREVIOUS ECG IS PRESENT  Echo 09/16/22 demonstrated  1. Left ventricular ejection fraction, by estimation, is 60 to 65%. The  left ventricle has normal function. The left ventricle has no regional  wall motion abnormalities. There is moderate left ventricular hypertrophy.  Left ventricular diastolic  parameters are consistent with Grade I diastolic dysfunction (impaired  relaxation).   2. Right ventricular systolic function is normal. The right ventricular  size is normal. There is normal pulmonary artery systolic pressure.   3. No evidence of mitral valve regurgitation.   4. S/p 23 mm Edwards Porcine AVR. V max 2.3 m/s. Mean gradient 12 mmHg.  DI 0.45. No PVL. Normal prosthesis. Aortic valve regurgitation is not  visualized.   5. Aortic aorta is not well visualized.   6. The inferior vena cava is dilated in size with >50% respiratory  variability, suggesting right atrial pressure of 8 mmHg.    ASSESSMENT & PLAN CHA2DS2-VASc Score = 8  The patient's score is based upon: CHF History: 1 HTN History: 1 Diabetes History: 1 Stroke History: 2 Vascular Disease History: 1 Age Score: 2 Gender Score: 0       ASSESSMENT AND PLAN: Paroxysmal Atrial Fibrillation (ICD10:  I48.0) The patient's CHA2DS2-VASc score is 8, indicating a 10.8% annual risk of stroke.   S/p Afib ablation on 04/07/23 by Dr. Lalla Brothers.  He is currently in NSR.  No changes at this time.  Secondary Hypercoagulable State (ICD10:  D68.69) The patient is at significant risk for stroke/thromboembolism based upon  his CHA2DS2-VASc Score of 8.  Continue Apixaban (Eliquis).      Follow up as scheduled with EP.    Lake Bells, [name]  [facility] [address] [place], [place] [address] [phone]

## 2023-05-12 ENCOUNTER — Encounter: Payer: Self-pay | Admitting: Cardiology

## 2023-05-18 ENCOUNTER — Ambulatory Visit: Payer: Medicare Other | Admitting: Family Medicine

## 2023-05-18 ENCOUNTER — Encounter: Payer: Self-pay | Admitting: Internal Medicine

## 2023-05-18 ENCOUNTER — Ambulatory Visit (INDEPENDENT_AMBULATORY_CARE_PROVIDER_SITE_OTHER): Payer: Medicare Other | Admitting: Internal Medicine

## 2023-05-18 VITALS — BP 144/80 | HR 60 | Temp 98.3°F | Ht 68.0 in | Wt 201.0 lb

## 2023-05-18 DIAGNOSIS — E559 Vitamin D deficiency, unspecified: Secondary | ICD-10-CM

## 2023-05-18 DIAGNOSIS — I10 Essential (primary) hypertension: Secondary | ICD-10-CM

## 2023-05-18 DIAGNOSIS — R051 Acute cough: Secondary | ICD-10-CM | POA: Diagnosis not present

## 2023-05-18 DIAGNOSIS — I5032 Chronic diastolic (congestive) heart failure: Secondary | ICD-10-CM

## 2023-05-18 MED ORDER — AZITHROMYCIN 250 MG PO TABS: ORAL_TABLET | ORAL | 1 refills | Status: AC

## 2023-05-18 MED ORDER — HYDROCODONE BIT-HOMATROP MBR 5-1.5 MG/5ML PO SOLN: 5.0000 mL | Freq: Four times a day (QID) | ORAL | 0 refills | Status: AC | PRN

## 2023-05-18 NOTE — Progress Notes (Signed)
Patient ID: Troy Banks, male   DOB: [birth year], 82 y.o.   MRN: [id number]        Chief Complaint: follow up acute cough, dCHF, htn, low vit d       HPI:  Troy Banks is a 82 y.o. male Here with acute onset mild to mod 2-3 days ST, HA, general weakness and malaise, with prod cough greenish sputum, but Pt denies chest pain, increased sob or doe, wheezing, orthopnea, PND, increased LE swelling, palpitations, dizziness or syncope.   Pt denies polydipsia, polyuria, or new focal neuro s/s.    Pt denies fever, wt loss, night sweats, loss of appetite, or other constitutional symptoms  Still smoking, not ready to quit       Wt Readings from Last 3 Encounters:  05/18/23 201 lb (91.2 kg)  05/08/23 201 lb (91.2 kg)  04/07/23 203 lb (92.1 kg)   BP Readings from Last 3 Encounters:  05/18/23 (!) 144/80  05/08/23 (!) 142/60  04/07/23 (!) 156/83         Past Medical History:  Diagnosis Date   Allergy    Anxiety    PTSD   Aortic stenosis, moderate 03/27/2015   post AVR echo Echo 11/16: Mild LVH, EF 55-60%, normal wall motion, grade 1 diastolic dysfunction, bioprosthetic AVR okay (mean gradient 18 mmHg) with trivial AI, mild LAE, atrial septal aneurysm, small effusion    Arthritis    "qwhere" (10/29/2015)   Cancer (HCC)    skin cancer   Carotid artery stenosis    Carotid US 11/17: 1-39% bilateral ICA stenosis. F/u prn   Cervical stenosis of spinal canal    fusion 2006   CHF (congestive heart failure) (HCC)    "secondry to OHS"   Closed left pilon fracture    COLONIC POLYPS, HX OF    Complication of anesthesia    "takes a lot to put him under and has woken up during surgery" Difficult to wake up . PTSD; "does not metabolize RX well"that he gets "violent", per pt.    Coronary artery disease    Depression    DIVERTICULOSIS, COLON    Dyspnea    Dysrhythmia    2 bouts of afib post op and at home but corrected with medication.   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)    confirmed heterozygous 10/24/14 labs   PTSD (post-traumatic stress disorder)    PTSD (post-traumatic stress disorder)    Sleep apnea    On a CPAP   Past Surgical History:  Procedure Laterality Date   ANKLE FUSION Left 03/17/2017   Procedure: FUSION  PILON FRACTURE WITH BONE GRAFTING;  Surgeon: Roby Lofts, MD;  Location: [facility] OR;  Service: Orthopedics;  Laterality: Left;   ANTERIOR FUSION  CERVICAL SPINE  2002   AORTIC VALVE REPLACEMENT N/A 03/30/2015   Procedure: AORTIC VALVE REPLACEMENT (AVR);  Surgeon: Delight Ovens, MD;  Location: [facility] OR;  Service: Open Heart Surgery;  Laterality: N/A;   ATRIAL FIBRILLATION ABLATION N/A 04/07/2023   Procedure: ATRIAL FIBRILLATION ABLATION;  Surgeon: Lanier Prude, MD;  Location: [facility] INVASIVE CV LAB;  Service: Cardiovascular;  Laterality: N/A;   CARDIAC CATHETERIZATION N/A 03/26/2015   Procedure: Left Heart Cath and Coronary Angiography;  Surgeon: Marykay Lex, MD;  Location: [facility] INVASIVE CV LAB;  Service: Cardiovascular;  Laterality: N/A;   CARDIAC VALVE REPLACEMENT     CHOLECYSTECTOMY N/A 10/28/2022   Procedure: LAPAROSCOPIC CHOLECYSTECTOMY WITH INTRAOPERATIVE CHOLANGIOGRAM;  Surgeon: Luretha Murphy, MD;  Location: [facility] ORS;  Service: General;  Laterality: N/A;   COLONOSCOPY W/ POLYPECTOMY     CORONARY ARTERY BYPASS GRAFT N/A 03/30/2015   Procedure: CORONARY ARTERY BYPASS GRAFTING (CABG) x four, using  left internal mammary artery and left    leg greater saphenous vein harvested endoscopically ;  Surgeon: Delight Ovens, MD;  Location: [facility] OR;  Service: Open Heart Surgery;  Laterality: N/A;   CORONARY/GRAFT ANGIOGRAPHY N/A 12/03/2021   Procedure: CORONARY/GRAFT ANGIOGRAPHY;  Surgeon: Swaziland, [name], MD;  Location: [facility] INVASIVE CV LAB;  Service: Cardiovascular;  Laterality: N/A;   CYSTOSCOPY/URETEROSCOPY/HOLMIUM LASER/STENT PLACEMENT Bilateral 11/07/2016   Procedure: CYSTOSCOPY/URETEROSCOPY/HOLMIUM LASER/STENT PLACEMENT;  Surgeon: Hildred Laser, MD;  Location: [facility] ORS;  Service: Urology;  Laterality: Bilateral;   CYSTOSCOPY/URETEROSCOPY/HOLMIUM LASER/STENT PLACEMENT Bilateral 11/23/2016   Procedure: CYSTOSCOPY/BILATERAL URETEROSCOPY/HOLMIUM LASER/STENT EXCHANGE;  Surgeon: Hildred Laser, MD;  Location: [facility] ORS;  Service: Urology;  Laterality: Bilateral;  NEEDS 90 MIN    EXTERNAL FIXATION LEG Left 02/22/2017   Procedure: EXTERNAL FIXATION LEFT ANKLE;  Surgeon: Sheral Apley, MD;  Location: [facility] OR;  Service: Orthopedics;  Laterality: Left;   HEMORRHOID BANDING  1970s   INGUINAL HERNIA REPAIR Left 1962   KIDNEY STONE SURGERY  1986   "cut me open"   LEFT HEART CATH AND CORS/GRAFTS ANGIOGRAPHY N/A 10/25/2018   Procedure: LEFT HEART CATH AND CORS/GRAFTS ANGIOGRAPHY;  Surgeon: Marykay Lex, MD;  Location: [facility] INVASIVE CV LAB;  Service: Cardiovascular;  Laterality: N/A;   LEFT HEART CATH AND CORS/GRAFTS ANGIOGRAPHY N/A 12/03/2021   Procedure: LEFT HEART CATH AND CORS/GRAFTS ANGIOGRAPHY;  Surgeon: Swaziland, [name], MD;  Location: [facility] INVASIVE CV LAB;  Service: Cardiovascular;  Laterality: N/A;   POSTERIOR FUSION CERVICAL SPINE  2006   TEE WITHOUT CARDIOVERSION N/A 03/30/2015   Procedure: TRANSESOPHAGEAL ECHOCARDIOGRAM (TEE);  Surgeon: Delight Ovens, MD;  Location: [facility] OR;  Service: Open Heart Surgery;  Laterality: N/A;   TESTICLE TORSION REDUCTION Right 1960   TOE FUSION Left 1985 & 2004    "great toe" and removal   TONSILLECTOMY     TOTAL HIP ARTHROPLASTY Right 10/27/2015   Procedure: RIGHT TOTAL HIP ARTHROPLASTY ANTERIOR APPROACH and steroid injection right foot;  Surgeon: Kathryne Hitch, MD;  Location: [facility] OR;  Service: Orthopedics;  Laterality: Right;   TOTAL HIP ARTHROPLASTY Left 08/13/2019   TOTAL HIP ARTHROPLASTY Left 08/13/2019   Procedure: LEFT TOTAL HIP ARTHROPLASTY ANTERIOR APPROACH;  Surgeon: Kathryne Hitch, MD;  Location: [facility] OR;  Service: Orthopedics;  Laterality: Left;    reports that he has been smoking cigarettes. He started smoking about 66 years ago. He has a 34 pack-year smoking history. He has never used smokeless tobacco. He reports current alcohol use of about 3.0 standard drinks  of alcohol per week. He reports that he does not use drugs. family history includes Alcohol abuse in an other family member; Arthritis in an other family member; Diabetes in his mother and paternal grandfather; Drug abuse in his sister; Healthy in his brother, daughter, and son; Heart disease in his father, maternal grandfather, maternal grandmother, mother, and paternal grandfather; Hypertension in his father, maternal grandfather, maternal grandmother, mother, and paternal grandfather; Prostate cancer in his maternal uncle; Tremor in his sister. Allergies  Allergen Reactions   Acyclovir And Related Other (See Comments)    Accumulates and causes stroke-like side-effects due to cytochrome P450 enzyme deficiency   Benzodiazepines Other (See Comments)    Patient states he gets stroke symptoms with benzo's.    Compazine [Prochlorperazine Maleate] Other (See Comments)    Accumulates and causes stroke-like side-effects due to cytochrome P450 enzyme deficiency   Coreg [Carvedilol] Other (See Comments)    Accumulates and causes stroke-like side-effects due to cytochrome P450 enzyme deficiency Patient poor metabolizer of CYP2D6 - Coreg undergoes extensive hepatic (including 2D6)    Flomax [Tamsulosin Hcl] Other (See Comments)    Accumulates and causes stroke-like side-effects due to cytochrome P450 enzyme deficiency   Lisinopril Other (See Comments)    Hyperkalemia. 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   Mobic [Meloxicam] Other (See Comments)    Accumulates and causes stroke-like side-effects due to cytochrome P450 enzyme deficiency   Morphine And Codeine Other (See Comments)    Per pt's wife morphine also contraindicated due to p450 enzyme deficiency.     Ondansetron Other (See Comments)    Accumulates and causes stroke-like side-effects due to cytochrome P450 enzyme deficiency    Oxycodone Other (See Comments)    Accumulates and causes stroke-like side-effects due to cytochrome P450 enzyme deficiency   Promethazine Other (See Comments)    Accumulates and causes stroke-like side-effects due to cytochrome P450 enzyme deficiency   Sertraline Hcl Other (See Comments)    Accumulates and causes stroke-like side-effects due to cytochrome P450 enzyme deficiency   Tramadol Other (See Comments)    Accumulates and causes stroke-like side-effects due to cytochrome P450 enzyme deficiency   Atorvastatin Other (See Comments)    MYALGIAS   Colchicine Other (See Comments)    ataxia   Fentanyl Other (See Comments)    Accumulates and cause stroke like symptoms   Hydrocodone Other (See Comments)    Accumulates and causes stroke-like side-effects due to cytochrome P450 enzyme deficiency   Current Outpatient Medications on File Prior to Visit  Medication Sig Dispense Refill   allopurinol (ZYLOPRIM) 300 MG tablet TAKE 1 TABLET BY MOUTH EVERY DAY 90 tablet 3   amLODipine (NORVASC) 10 MG tablet TAKE 1 TABLET BY MOUTH EVERY DAY 90 tablet 3   apixaban (ELIQUIS) 5 MG TABS tablet Take 1 tablet (5 mg total) by mouth 2 (two) times daily. 180 tablet 3   Ascorbic Acid (VITAMIN C) 1000 MG tablet Take 1,000 mg by mouth daily.      aspirin EC 81 MG tablet Take 81 mg by mouth every other day. Swallow whole.     Carboxymethylcellulose Sodium (LUBRICANT EYE DROPS OP) Place 1 drop into both eyes 4 (four) times daily as needed (dry/irritated eyes.).     Cholecalciferol (VITAMIN D-3) 25 MCG (1000 UT) CAPS Take 1,000 Units by mouth every other day.     diclofenac Sodium (VOLTAREN) 1 % GEL Apply 2 g topically as needed.     diltiazem (CARDIZEM  CD) 240 MG 24 hr capsule TAKE 1 CAPSULE BY MOUTH DAILY 90 capsule 3   famotidine (PEPCID) 40 MG tablet Take 1 tablet (40 mg total) by mouth daily. (Patient not taking: Reported on 05/08/2023) 90 tablet 3   furosemide (LASIX) 40 MG tablet TAKE 1 TABLET BY MOUTH EVERY OTHER DAY 45 tablet 2   isosorbide mononitrate (IMDUR) 60 MG 24 hr tablet Take 1 tablet (60 mg total) by mouth daily. 90 tablet 3   levothyroxine (SYNTHROID) 25 MCG tablet TAKE 1 TABLET BY MOUTH EVERY MORNING WITH BREAKFAST (Patient taking differently: Take 25 mcg by mouth every other day.) 90 tablet 3   nitroGLYCERIN (NITROSTAT) 0.4 MG SL tablet PLACE 1 TABLET UNDER THE TONGUE EVERY 5 MINUTES AS NEEDED FOR CHEST PAIN. 25 tablet 3   NON FORMULARY Pt uses a cpap nightly     pantoprazole (PROTONIX) 40 MG tablet Take 1 tablet (40 mg total) by mouth daily. Does not need refill after 45 days. 45 tablet 0   rosuvastatin (CRESTOR) 10 MG tablet TAKE 1/2 TABLET BY MOUTH EVERY DAY (Patient taking differently: Take 5 mg by mouth every other day.) 45 tablet 2   tiZANidine (ZANAFLEX) 2 MG tablet Take 2 mg by mouth daily as needed for muscle spasms.     Turmeric 500 MG CAPS Take 500 mg by mouth daily.      vitamin B-12 (CYANOCOBALAMIN) 1000 MCG tablet Take 1,000 mcg by mouth every other day.     [DISCONTINUED] ranitidine (ZANTAC) 150 MG tablet TAKE 1 TABLET BY MOUTH 2 TIMES DAILY (Patient taking differently: Take 150 mg by mouth 2 (two) times a day.) 180 tablet 0   No current facility-administered medications on file prior to visit.        ROS:   All others reviewed and negative.  Objective        PE:  BP (!) 144/80 (BP Location: Left Arm, Patient Position: Sitting, Cuff Size: Normal)   Pulse 60   Temp 98.3 F (36.8 C) (Oral)   Ht 5\' 8"  (1.727 m)   Wt 201 lb (91.2 kg)   SpO2 99%   BMI 30.56 kg/m                 Constitutional: Pt appears in NAD               HENT: Head: NCAT.                Right Ear: External ear normal.                 Left Ear: External ear normal. Bilat tm's with mild erythema.  Max sinus areas non tender.  Pharynx with mild erythema, no exudate               Eyes: . Pupils are equal, round, and reactive to light. Conjunctivae and EOM are normal               Nose: without d/c or deformity               Neck: Neck supple. Gross normal ROM               Cardiovascular: Normal rate and regular rhythm.                 Pulmonary/Chest: Effort normal and breath sounds without rales or wheezing.                Abd:  Soft, NT,  ND, + BS, no organomegaly               Neurological: Pt is alert. At baseline orientation, motor grossly intact               Skin: Skin is warm. No rashes, no other new lesions, LE edema - none               Psychiatric: Pt behavior is normal without agitation   Micro: none  Cardiac tracings I have personally interpreted today:  none  Pertinent Radiological findings (summarize): none   Lab Results  Component Value Date   WBC 7.2 03/22/2023   HGB 13.2 03/22/2023   HCT 41.7 03/22/2023   PLT 235 03/22/2023   GLUCOSE 257 (H) 03/22/2023   CHOL 109 12/27/2022   TRIG 181.0 (H) 12/27/2022   HDL 37.10 (L) 12/27/2022   LDLDIRECT 77.0 06/28/2022   LDLCALC 36 12/27/2022   ALT 27 12/27/2022   AST 19 12/27/2022   NA 142 03/22/2023   K 4.7 03/22/2023   CL 103 03/22/2023   CREATININE 1.03 03/22/2023   BUN 13 03/22/2023   CO2 22 03/22/2023   TSH 3.45 12/27/2022   PSA 1.95 06/28/2022   INR 1.1 09/15/2022   HGBA1C 7.1 (H) 12/27/2022   MICROALBUR 14.7 (H) 06/28/2022    Assessment/Plan:  Troy Banks is a 82 y.o. White or Caucasian [1] male with  has a past medical history of Allergy, Anxiety, Aortic stenosis, moderate (03/27/2015), Arthritis, Cancer (HCC), Carotid artery stenosis, Cervical stenosis of spinal canal, CHF (congestive heart failure) (HCC), Closed left pilon fracture, COLONIC POLYPS, HX OF, Complication of anesthesia, Coronary artery disease, Depression, DIVERTICULOSIS, COLON, Dyspnea, Dysrhythmia, Familial tremor (03/19/2014), Family history of adverse reaction to anesthesia, GERD (gastroesophageal reflux disease), GOUT, Headache, Heart murmur, High cholesterol, History of blood transfusion, History of kidney stones, HYPERTENSION, Hypothyroidism, Myocardial infarction (HCC), OSA on CPAP, Pneumonia, 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  Chronic diastolic CHF (congestive heart failure) (HCC) Stable volume, cont current med tx  Vitamin D deficiency Last vitamin D Lab Results  Component Value Date   VD25OH 28.36 (L) 12/27/2022   Low, to start oral replacement   Essential hypertension BP Readings from Last 3 Encounters:  05/18/23 (!) 144/80  05/08/23 (!) 142/60  04/07/23 (!) 156/83   Uncontrolled, pt stated controlled at home, pt to continue medical treatment norvasc 10 every day, card cd 240 every day, declines other change today   Cough Mild to mod, for antibx course zpack, cough med prn,  to f/u any worsening symptoms or concerns  Followup: Return if symptoms worsen or fail to improve.  Oliver Barre, MD 05/20/2023 5:09 PM [facility] - [facility]

## 2023-05-18 NOTE — Patient Instructions (Signed)
Please take all new medication as prescribed - the antibiotic, and cough medicine as needed  Please continue all other medications as before, and refills have been done if requested.  Please have the pharmacy call with any other refills you may need.  Please continue your efforts at being more active, low cholesterol diet, and weight control.  Please keep your appointments with your specialists as you may have planned   

## 2023-05-20 ENCOUNTER — Encounter: Payer: Self-pay | Admitting: Internal Medicine

## 2023-05-20 DIAGNOSIS — R059 Cough, unspecified: Secondary | ICD-10-CM | POA: Insufficient documentation

## 2023-05-20 NOTE — Assessment & Plan Note (Signed)
BP Readings from Last 3 Encounters:  05/18/23 (!) 144/80  05/08/23 (!) 142/60  04/07/23 (!) 156/83   Uncontrolled, pt stated controlled at home, pt to continue medical treatment norvasc 10 every day, card cd 240 every day, declines other change today

## 2023-05-20 NOTE — Assessment & Plan Note (Signed)
Last vitamin D Lab Results  Component Value Date   VD25OH 28.36 (L) 12/27/2022   Low, to start oral replacement

## 2023-05-20 NOTE — Assessment & Plan Note (Signed)
Stable volume, cont current med tx 

## 2023-05-20 NOTE — Assessment & Plan Note (Signed)
Mild to mod, for antibx course zpack, cough med prn,  to f/u any worsening symptoms or concerns 

## 2023-05-26 ENCOUNTER — Encounter: Payer: Self-pay | Admitting: Internal Medicine

## 2023-05-26 MED ORDER — CEFUROXIME AXETIL 250 MG PO TABS: 250.0000 mg | ORAL_TABLET | Freq: Two times a day (BID) | ORAL | 0 refills | Status: AC

## 2023-06-03 ENCOUNTER — Other Ambulatory Visit: Payer: Self-pay | Admitting: Internal Medicine

## 2023-06-05 ENCOUNTER — Other Ambulatory Visit: Payer: Self-pay

## 2023-06-26 NOTE — Progress Notes (Unsigned)
Electrophysiology Office Note:   Date:  06/27/2023  ID:  Troy Banks, DOB 1940/10/13, MRN [id number]  Primary Cardiologist: Peter Swaziland, MD Primary Heart Failure: None Electrophysiologist: Lanier Prude, MD      History of Present Illness:   Troy Banks is a 83 y.o. male with h/o AFL, AF, SB, CAD s/p CABG 2016, chronic dCHF, HTN, CVA, OSA on CPAP, GERD, hypothyroidism & DM II seen today for routine electrophysiology follow up.  He is s/p AF / AFL ablation 04/07/2023.  Subsequently was seen in the AF Clinic in 05/2023 and was in SR. Given his CYP Enzyme variant, he is not a long term OAC candidate & is planned 07/2023 for LAA occlusion per Dr. Lalla Brothers.   Since last being seen in our clinic the patient reports doing well since ablation. He has not had any known recurrent AF. No missed doses of OAC.  He asks questions about his pending procedure in February.  His wife is with him. He denies chest pain, palpitations, dyspnea, PND, orthopnea, nausea, vomiting, dizziness, syncope, edema, weight gain, or early satiety.   Review of systems complete and found to be negative unless listed in HPI.   EP Information / Studies Reviewed:    EKG is ordered today. Personal review as below.  EKG Interpretation Date/Time:  Tuesday June 27 2023 10:57:27 EST Ventricular Rate:  64 PR Interval:  234 QRS Duration:  80 QT Interval:  394 QTC Calculation: 406 R Axis:   -8  Text Interpretation: Sinus rhythm with 1st degree A-V block with Premature atrial complexes Confirmed by Canary Brim ([id number]) on 06/27/2023 11:08:22 AM   Studies:  LHC 11/2021 > 3 vessel obstructive CAD, all grafts patent, no significant change since 2020 ECHO LVEF 60-65%, G1DD, s/p 23 mm Edwards porcine AVR with nml function CT Cardiac 03/31/23 > LA normal size, LAA large broccoli type with two lobes and ostial size of 15.8 mm & length 36.5 mm.  RA normal size. No PFO/ASD, normal pulmonary vein drainage, CAC score of  2631 which is 88th percentile for matched controls EPS 04/07/2023 > PVI ablation, ablation of the posterior wall, & CTI   Arrhythmia / AAD AF  AFL     Risk Assessment/Calculations:    CHA2DS2-VASc Score = 8   This indicates a 10.8% annual risk of stroke. The patient's score is based upon: CHF History: 1 HTN History: 1 Diabetes History: 1 Stroke History: 2 Vascular Disease History: 1 Age Score: 2 Gender Score: 0     Physical Exam:   VS:  BP (!) 160/72   Pulse 64   Ht 5\' 8"  (1.727 m)   Wt 204 lb 3.2 oz (92.6 kg)   SpO2 97%   BMI 31.05 kg/m    Wt Readings from Last 3 Encounters:  06/27/23 204 lb 3.2 oz (92.6 kg)  05/18/23 201 lb (91.2 kg)  05/08/23 201 lb (91.2 kg)     GEN: Well nourished, well developed in no acute distress NECK: No JVD; No carotid bruits CARDIAC: Regular rate and rhythm, no murmurs, rubs, gallops RESPIRATORY:  Clear to auscultation without rales, wheezing or rhonchi  ABDOMEN: Soft, non-tender, non-distended EXTREMITIES:  No edema; No deformity   ASSESSMENT AND PLAN:    Paroxysmal AF  AFL  CHA2DS2-VASc 8.  S/p PVI / CTI ablation 04/2023.  -EKG with NSR   -continue cardizem CD 240mg  daily  -no recurrent symptoms of AF -anticoagulation for stroke prophylaxis   Secondary Hypercoagulable State  Cytochrome P450  Enzyme Deficiency  HAS-BLED score of 4.  -pt is not a long term DOAC candidate due to above -continue Eliquis 5mg  BID, dose reviewed and appropriate by wt/cr  -no issues with significant bleeding on eliquis > 2 nose bleeds that resolved spontaneously -pt pending LAA occulsion > pre-operative labs CMP, CBC  -letters / instructions given to patient / wife  -reviewed procedure with patient and wife > models shown of how device works  CAD s/p CABG  VHD / Bioprosthetic AVR Chronic dCHF -per Cardiology  -no anginal symptoms  -euvolemic on exam   Hypertension  -elevated in clinic, reports control at home  OSA  -CPAP compliance  encouraged    Follow up with Dr. Lalla Brothers  as planned for LAA occlusion    Signed, Canary Brim, NP-C, AGACNP-BC [facility] - Electrophysiology  06/27/2023, 1:29 PM

## 2023-06-27 ENCOUNTER — Ambulatory Visit: Payer: Medicare Other | Attending: Pulmonary Disease | Admitting: Pulmonary Disease

## 2023-06-27 ENCOUNTER — Encounter: Payer: Self-pay | Admitting: Pulmonary Disease

## 2023-06-27 VITALS — BP 160/72 | HR 64 | Ht 68.0 in | Wt 204.2 lb

## 2023-06-27 DIAGNOSIS — I350 Nonrheumatic aortic (valve) stenosis: Secondary | ICD-10-CM | POA: Diagnosis not present

## 2023-06-27 DIAGNOSIS — Z951 Presence of aortocoronary bypass graft: Secondary | ICD-10-CM | POA: Diagnosis not present

## 2023-06-27 DIAGNOSIS — I10 Essential (primary) hypertension: Secondary | ICD-10-CM

## 2023-06-27 DIAGNOSIS — D6869 Other thrombophilia: Secondary | ICD-10-CM | POA: Diagnosis not present

## 2023-06-27 DIAGNOSIS — I480 Paroxysmal atrial fibrillation: Secondary | ICD-10-CM | POA: Diagnosis not present

## 2023-06-27 DIAGNOSIS — I25118 Atherosclerotic heart disease of native coronary artery with other forms of angina pectoris: Secondary | ICD-10-CM

## 2023-06-27 NOTE — Patient Instructions (Signed)
Medication Instructions:   Your physician recommends that you continue on your current medications as directed. Please refer to the Current Medication list given to you today.   *If you need a refill on your cardiac medications before your next appointment, please call your pharmacy*   Lab Work:   PLEASE GO DOWN STAIRS FIRST FLOOR  [facility]  [address] :  CMET AND CBC TODAY     If you have labs (blood work) drawn today and your tests are completely normal, you will receive your results only by: MyChart Message (if you have MyChart) OR A paper copy in the mail If you have any lab test that is abnormal or we need to change your treatment, we will call you to review the results.   Testing/Procedures: NONE ORDERED  TODAY      Follow-Up: At [facility], you and your health needs are our priority.  As part of our continuing mission to provide you with exceptional heart care, we have created designated Provider Care Teams.  These Care Teams include your primary Cardiologist (physician) and Advanced Practice Providers (APPs -  Physician Assistants and Nurse Practitioners) who all work together to provide you with the care you need, when you need it.  We recommend signing up for the patient portal called "MyChart".  Sign up information is provided on this After Visit Summary.  MyChart is used to connect with patients for Virtual Visits (Telemedicine).  Patients are able to view lab/test results, encounter notes, upcoming appointments, etc.  Non-urgent messages can be sent to your provider as well.   To learn more about what you can do with MyChart, go to [email or web address].       Other Instructions

## 2023-06-28 ENCOUNTER — Other Ambulatory Visit: Payer: Self-pay | Admitting: Internal Medicine

## 2023-06-28 ENCOUNTER — Ambulatory Visit (INDEPENDENT_AMBULATORY_CARE_PROVIDER_SITE_OTHER): Payer: Medicare Other | Admitting: Internal Medicine

## 2023-06-28 ENCOUNTER — Encounter: Payer: Self-pay | Admitting: Internal Medicine

## 2023-06-28 VITALS — BP 138/72 | HR 60 | Ht 68.0 in | Wt 208.2 lb

## 2023-06-28 DIAGNOSIS — E559 Vitamin D deficiency, unspecified: Secondary | ICD-10-CM | POA: Diagnosis not present

## 2023-06-28 DIAGNOSIS — G4733 Obstructive sleep apnea (adult) (pediatric): Secondary | ICD-10-CM | POA: Diagnosis not present

## 2023-06-28 DIAGNOSIS — Z7984 Long term (current) use of oral hypoglycemic drugs: Secondary | ICD-10-CM | POA: Diagnosis not present

## 2023-06-28 DIAGNOSIS — Z0000 Encounter for general adult medical examination without abnormal findings: Secondary | ICD-10-CM | POA: Diagnosis not present

## 2023-06-28 DIAGNOSIS — E785 Hyperlipidemia, unspecified: Secondary | ICD-10-CM

## 2023-06-28 DIAGNOSIS — E538 Deficiency of other specified B group vitamins: Secondary | ICD-10-CM

## 2023-06-28 DIAGNOSIS — E039 Hypothyroidism, unspecified: Secondary | ICD-10-CM | POA: Diagnosis not present

## 2023-06-28 DIAGNOSIS — I10 Essential (primary) hypertension: Secondary | ICD-10-CM | POA: Diagnosis not present

## 2023-06-28 DIAGNOSIS — E119 Type 2 diabetes mellitus without complications: Secondary | ICD-10-CM

## 2023-06-28 DIAGNOSIS — E7800 Pure hypercholesterolemia, unspecified: Secondary | ICD-10-CM

## 2023-06-28 DIAGNOSIS — Z0001 Encounter for general adult medical examination with abnormal findings: Secondary | ICD-10-CM | POA: Insufficient documentation

## 2023-06-28 DIAGNOSIS — R7303 Prediabetes: Secondary | ICD-10-CM

## 2023-06-28 LAB — HEPATIC FUNCTION PANEL
ALT: 28 U/L (ref 0–53)
AST: 28 U/L (ref 0–37)
Albumin: 4 g/dL (ref 3.5–5.2)
Alkaline Phosphatase: 66 U/L (ref 39–117)
Bilirubin, Direct: 0.3 mg/dL (ref 0.0–0.3)
Total Bilirubin: 1.3 mg/dL — ABNORMAL HIGH (ref 0.2–1.2)
Total Protein: 6.4 g/dL (ref 6.0–8.3)

## 2023-06-28 LAB — LIPID PANEL
Cholesterol: 115 mg/dL (ref 0–200)
HDL: 46.1 mg/dL (ref >39.00)
LDL Cholesterol: 43 mg/dL (ref 0–99)
NonHDL: 68.89
Total CHOL/HDL Ratio: 2
Triglycerides: 131 mg/dL (ref 0.0–149.0)
VLDL: 26.2 mg/dL (ref 0.0–40.0)

## 2023-06-28 LAB — URINALYSIS, ROUTINE W REFLEX MICROSCOPIC
Bacteria, UA: NONE SEEN
Bilirubin Urine: NEGATIVE
Ketones, ur: NEGATIVE
Leukocytes,Ua: NEGATIVE
Nitrite: NEGATIVE
Specific Gravity, Urine: 1.02 (ref 1.000–1.030)
Total Protein, Urine: 30 — AB
Urine Glucose: NEGATIVE
Urobilinogen, UA: 0.2 (ref 0.0–1.0)
pH: 6 (ref 5.0–8.0)

## 2023-06-28 LAB — MICROALBUMIN / CREATININE URINE RATIO
Creatinine,U: 104.5 mg/dL
Microalb Creat Ratio: 23 mg/g (ref 0.0–30.0)
Microalb, Ur: 24 mg/dL — ABNORMAL HIGH (ref 0.0–1.9)

## 2023-06-28 LAB — COMPREHENSIVE METABOLIC PANEL
ALT: 28 IU/L (ref 0–44)
AST: 26 IU/L (ref 0–40)
Albumin: 4.1 g/dL (ref 3.8–4.8)
Alkaline Phosphatase: 86 IU/L (ref 44–121)
BUN/Creatinine Ratio: 18 (ref 10–24)
BUN: 16 mg/dL (ref 8–27)
Bilirubin Total: 1.1 mg/dL (ref 0.0–1.2)
CO2: 21 mmol/L (ref 20–29)
Calcium: 9.1 mg/dL (ref 8.6–10.2)
Chloride: 106 mmol/L (ref 96–106)
Creatinine, Ser: 0.88 mg/dL (ref 0.76–1.27)
Globulin, Total: 2.3 g/dL (ref 1.5–4.5)
Glucose: 163 mg/dL — ABNORMAL HIGH (ref 70–99)
Potassium: 3.9 mmol/L (ref 3.5–5.2)
Sodium: 140 mmol/L (ref 134–144)
Total Protein: 6.4 g/dL (ref 6.0–8.5)
eGFR: 87 mL/min/{1.73_m2} (ref >59)

## 2023-06-28 LAB — VITAMIN D 25 HYDROXY (VIT D DEFICIENCY, FRACTURES): VITD: 25.19 ng/mL — ABNORMAL LOW (ref 30.00–100.00)

## 2023-06-28 LAB — CBC
Hematocrit: 42.4 % (ref 37.5–51.0)
Hemoglobin: 14.1 g/dL (ref 13.0–17.7)
MCH: 28.1 pg (ref 26.6–33.0)
MCHC: 33.3 g/dL (ref 31.5–35.7)
MCV: 85 fL (ref 79–97)
Platelets: 210 10*3/uL (ref 150–450)
RBC: 5.02 x10E6/uL (ref 4.14–5.80)
RDW: 15.6 % — ABNORMAL HIGH (ref 11.6–15.4)
WBC: 7.8 10*3/uL (ref 3.4–10.8)

## 2023-06-28 LAB — VITAMIN B12: Vitamin B-12: 685 pg/mL (ref 211–911)

## 2023-06-28 LAB — TSH: TSH: 4.45 u[IU]/mL (ref 0.35–5.50)

## 2023-06-28 LAB — HEMOGLOBIN A1C: Hgb A1c MFr Bld: 7.6 % — ABNORMAL HIGH (ref 4.6–6.5)

## 2023-06-28 MED ORDER — METFORMIN HCL ER 500 MG PO TB24: 500.0000 mg | ORAL_TABLET | Freq: Every day | ORAL | 3 refills | Status: DC

## 2023-06-28 NOTE — Patient Instructions (Signed)
Please continue all other medications as before, and refills have been done if requested.  Please have the pharmacy call with any other refills you may need.  Please continue your efforts at being more active, low cholesterol diet, and weight control.  You are otherwise up to date with prevention measures today.  Please keep your appointments with your specialists as you may have planned  Please go to the LAB at the blood drawing area for the tests to be done  You will be contacted by phone if any changes need to be made immediately.  Otherwise, you will receive a letter about your results with an explanation, but please check with MyChart first.  Please make an Appointment to return in 6 months, or sooner if needed

## 2023-06-28 NOTE — Progress Notes (Signed)
Patient ID: Troy Banks, male   DOB: June 11, 1940, 83 y.o.   MRN: [id number]         Chief Complaint:: wellness exam and dm, htn, hld, low thyroid, osa, low vit d       HPI:  Troy Banks is a 83 y.o. male here for wellness exam; decliens covid booster, for shingrix at pharmacy, declines colonoscopy due to age as well as age out of lung ca screening. O/w up to date                        Also for watchman placed on feb 6 after recent ablation.  To f/u urology next wk Dr Cardell Peach with recent renal stones.Pt denies chest pain, increased sob or doe, wheezing, orthopnea, PND, increased LE swelling, palpitations, dizziness or syncope.   Pt denies polydipsia, polyuria, or new focal neuro s/s.    Pt denies fever, wt loss, night sweats, loss of appetite, or other constitutional symptoms  Good [facility]   Wt Readings from Last 3 Encounters:  06/28/23 208 lb 3.2 oz (94.4 kg)  06/27/23 204 lb 3.2 oz (92.6 kg)  05/18/23 201 lb (91.2 kg)   BP Readings from Last 3 Encounters:  06/28/23 138/72  06/27/23 (!) 160/72  05/18/23 (!) 144/80   Immunization History  Administered Date(s) Administered   DT (Pediatric) 06/06/2008   PFIZER Comirnaty(Gray Top)Covid-19 Tri-Sucrose Vaccine 08/10/2020   PFIZER(Purple Top)SARS-COV-2 Vaccination 02/17/2020, 03/09/2020, 08/10/2020   Pneumococcal Conjugate-13 09/08/2014, 10/04/2017   Pneumococcal Polysaccharide-23 10/27/2017   Pneumococcal-Unspecified 02/20/2008   Td 06/06/2008   Tdap 11/21/2012, 11/02/2018   Health Maintenance Due  Topic Date Due   Lung Cancer Screening  Never done   Zoster Vaccines- Shingrix (1 of 2) Never done      Past Medical History:  Diagnosis Date   Allergy    Anxiety    PTSD   Aortic stenosis, moderate 03/27/2015   post AVR echo Echo 11/16: Mild LVH, EF 55-60%, normal wall motion, grade 1 diastolic dysfunction, bioprosthetic AVR okay (mean gradient 18 mmHg) with trivial AI, mild LAE, atrial septal  aneurysm, small effusion    Arthritis    "qwhere" (10/29/2015)   Cancer (HCC)    skin cancer   Carotid artery stenosis    Carotid US 11/17: 1-39% bilateral ICA stenosis. F/u prn   Cervical stenosis of spinal canal    fusion 2006   CHF (congestive heart failure) (HCC)    "secondry to [facility]"   Closed left pilon fracture    COLONIC POLYPS, HX OF    Complication of anesthesia    "takes a lot to put him under and has woken up during surgery" Difficult to wake up . PTSD; "does not metabolize RX well"that he gets "violent", per pt.    Coronary artery disease    Depression    DIVERTICULOSIS, COLON    Dyspnea    Dysrhythmia    2 bouts of afib post op and at home but corrected with medication.   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)    confirmed heterozygous 10/24/14 labs   PTSD (post-traumatic stress disorder)    PTSD (post-traumatic stress disorder)    Sleep apnea    On a CPAP   Past Surgical History:  Procedure Laterality Date   ANKLE FUSION Left 03/17/2017   Procedure: FUSION  PILON FRACTURE WITH BONE GRAFTING;  Surgeon: Roby Lofts, MD;  Location: [facility] OR;  Service: Orthopedics;  Laterality: Left;   ANTERIOR FUSION CERVICAL SPINE  2002   AORTIC VALVE REPLACEMENT N/A 03/30/2015   Procedure: AORTIC VALVE REPLACEMENT (AVR);  Surgeon: Delight Ovens, MD;  Location: [facility] OR;  Service: Open Heart Surgery;  Laterality: N/A;   ATRIAL  FIBRILLATION ABLATION N/A 04/07/2023   Procedure: ATRIAL FIBRILLATION ABLATION;  Surgeon: Lanier Prude, MD;  Location: [facility] INVASIVE CV LAB;  Service: Cardiovascular;  Laterality: N/A;   CARDIAC CATHETERIZATION N/A 03/26/2015   Procedure: Left Heart Cath and Coronary Angiography;  Surgeon: Marykay Lex, MD;  Location: [facility] INVASIVE CV LAB;  Service: Cardiovascular;  Laterality: N/A;   CARDIAC VALVE REPLACEMENT     CHOLECYSTECTOMY N/A 10/28/2022   Procedure: LAPAROSCOPIC CHOLECYSTECTOMY WITH INTRAOPERATIVE CHOLANGIOGRAM;  Surgeon: Luretha Murphy, MD;  Location: [facility] ORS;  Service: General;  Laterality: N/A;   COLONOSCOPY W/ POLYPECTOMY     CORONARY ARTERY BYPASS GRAFT N/A 03/30/2015   Procedure: CORONARY ARTERY BYPASS GRAFTING (CABG) x four, using left internal mammary artery and left    leg greater saphenous vein harvested endoscopically ;  Surgeon: Delight Ovens, MD;  Location: [facility] OR;  Service: Open Heart Surgery;  Laterality: N/A;   CORONARY/GRAFT ANGIOGRAPHY N/A 12/03/2021   Procedure: CORONARY/GRAFT ANGIOGRAPHY;  Surgeon: Swaziland, [name], MD;  Location: [facility] INVASIVE CV LAB;  Service: Cardiovascular;  Laterality: N/A;   CYSTOSCOPY/URETEROSCOPY/HOLMIUM LASER/STENT PLACEMENT Bilateral 11/07/2016   Procedure: CYSTOSCOPY/URETEROSCOPY/HOLMIUM LASER/STENT PLACEMENT;  Surgeon: Hildred Laser, MD;  Location: [facility] ORS;  Service: Urology;  Laterality: Bilateral;   CYSTOSCOPY/URETEROSCOPY/HOLMIUM LASER/STENT PLACEMENT Bilateral 11/23/2016   Procedure: CYSTOSCOPY/BILATERAL URETEROSCOPY/HOLMIUM LASER/STENT EXCHANGE;  Surgeon: Hildred Laser, MD;  Location: [facility] ORS;  Service: Urology;  Laterality: Bilateral;  NEEDS 90 MIN    EXTERNAL FIXATION LEG Left 02/22/2017   Procedure: EXTERNAL FIXATION LEFT ANKLE;  Surgeon: Sheral Apley, MD;  Location: [facility] OR;  Service: Orthopedics;  Laterality: Left;   HEMORRHOID BANDING  1970s   INGUINAL HERNIA REPAIR Left 1962   KIDNEY STONE SURGERY  1986   "cut me  open"   LEFT HEART CATH AND CORS/GRAFTS ANGIOGRAPHY N/A 10/25/2018   Procedure: LEFT HEART CATH AND CORS/GRAFTS ANGIOGRAPHY;  Surgeon: Marykay Lex, MD;  Location: [facility] INVASIVE CV LAB;  Service: Cardiovascular;  Laterality: N/A;   LEFT HEART CATH AND CORS/GRAFTS ANGIOGRAPHY N/A 12/03/2021   Procedure: LEFT HEART CATH AND CORS/GRAFTS ANGIOGRAPHY;  Surgeon: Swaziland, [name], MD;  Location: [facility] INVASIVE CV LAB;  Service: Cardiovascular;  Laterality: N/A;   POSTERIOR FUSION CERVICAL SPINE  2006   TEE WITHOUT CARDIOVERSION N/A 03/30/2015   Procedure: TRANSESOPHAGEAL ECHOCARDIOGRAM (TEE);  Surgeon: Delight Ovens, MD;  Location: [facility] OR;  Service: Open Heart Surgery;  Laterality: N/A;   TESTICLE TORSION REDUCTION Right 1960   TOE  FUSION Left 1985 & 2004   "great toe" and removal   TONSILLECTOMY     TOTAL HIP ARTHROPLASTY Right 10/27/2015   Procedure: RIGHT TOTAL HIP ARTHROPLASTY ANTERIOR APPROACH and steroid injection right foot;  Surgeon: Kathryne Hitch, MD;  Location: [facility] OR;  Service: Orthopedics;  Laterality: Right;   TOTAL HIP ARTHROPLASTY Left 08/13/2019   TOTAL HIP ARTHROPLASTY Left 08/13/2019   Procedure: LEFT TOTAL HIP ARTHROPLASTY ANTERIOR APPROACH;  Surgeon: Kathryne Hitch, MD;  Location: [facility] OR;  Service: Orthopedics;  Laterality: Left;    reports that he has been smoking cigarettes. He started smoking about 67 years ago. He has a 34 pack-year smoking history. He has never used smokeless tobacco. He reports current alcohol use of about 3.0 standard drinks of alcohol per week. He reports that he does not use drugs. family history includes Alcohol abuse in an other family member; Arthritis in an other family member; Diabetes in his mother and paternal grandfather; Drug abuse in his sister; Healthy in his brother, daughter, and son; Heart disease in his father, maternal grandfather, maternal grandmother, mother, and paternal grandfather; Hypertension in his father, maternal  grandfather, maternal grandmother, mother, and paternal grandfather; Prostate cancer in his maternal uncle; Tremor in his sister. Allergies  Allergen Reactions   Acyclovir And Related Other (See Comments)    Accumulates and causes stroke-like side-effects due to cytochrome P450 enzyme deficiency   Benzodiazepines Other (See Comments)    Patient states he gets stroke symptoms with benzo's.    Compazine [Prochlorperazine Maleate] Other (See Comments)    Accumulates and causes stroke-like side-effects due to cytochrome P450 enzyme deficiency   Coreg [Carvedilol] Other (See Comments)    Accumulates and causes stroke-like side-effects due to cytochrome P450 enzyme deficiency Patient poor metabolizer of CYP2D6 - Coreg undergoes extensive hepatic (including 2D6)   Flomax [Tamsulosin Hcl] Other (See Comments)    Accumulates and causes stroke-like side-effects due to cytochrome P450 enzyme deficiency   Lisinopril Other (See Comments)    Hyperkalemia. 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   Mobic [Meloxicam] Other (See Comments)    Accumulates and causes stroke-like side-effects due to cytochrome P450 enzyme deficiency   Morphine And Codeine Other (See Comments)    Per pt's wife morphine also contraindicated due to p450 enzyme deficiency.     Ondansetron Other (See Comments)    Accumulates and causes stroke-like side-effects due to cytochrome P450 enzyme deficiency    Oxycodone Other (See Comments)    Accumulates and causes stroke-like side-effects due to cytochrome P450 enzyme deficiency   Promethazine Other (See Comments)    Accumulates and causes stroke-like side-effects due to cytochrome P450 enzyme deficiency   Sertraline Hcl Other (See Comments)    Accumulates and causes stroke-like side-effects due to cytochrome P450 enzyme deficiency   Tramadol Other (See Comments)    Accumulates and causes stroke-like side-effects due to cytochrome  P450 enzyme deficiency   Atorvastatin Other (See Comments)    MYALGIAS   Colchicine Other (See Comments)    ataxia   Fentanyl Other (See Comments)    Accumulates and cause stroke like symptoms   Hydrocodone Other (See Comments)    Accumulates and causes stroke-like side-effects due to cytochrome P450 enzyme deficiency   Current Outpatient Medications on File Prior to Visit  Medication Sig Dispense Refill   allopurinol (ZYLOPRIM) 300 MG tablet TAKE 1 TABLET BY MOUTH ONCE DAILY 90 tablet 3   amLODipine (NORVASC) 10 MG tablet TAKE  1 TABLET BY MOUTH EVERY DAY 90 tablet 3   apixaban (ELIQUIS) 5 MG TABS tablet Take 1 tablet (5 mg total) by mouth 2 (two) times daily. 180 tablet 3   Ascorbic Acid (VITAMIN C) 1000 MG tablet Take 1,000 mg by mouth daily.     aspirin EC 81 MG tablet Take 81 mg by mouth every other day. Swallow whole.     Carboxymethylcellulose Sodium (LUBRICANT EYE DROPS OP) Place 1 drop into both eyes 4 (four) times daily as needed (dry/irritated eyes.).     Cholecalciferol (VITAMIN D-3) 25 MCG (1000 UT) CAPS Take 1,000 Units by mouth every other day.     diclofenac Sodium (VOLTAREN) 1 % GEL Apply 2 g topically as needed.     diltiazem (CARDIZEM CD) 240 MG 24 hr capsule TAKE 1 CAPSULE BY MOUTH DAILY 90 capsule 3   famotidine (PEPCID) 40 MG tablet Take 1 tablet (40 mg total) by mouth daily. 90 tablet 3   furosemide (LASIX) 40 MG tablet TAKE 1 TABLET BY MOUTH EVERY OTHER DAY 45 tablet 2   isosorbide mononitrate (IMDUR) 60 MG 24 hr tablet Take 1 tablet (60 mg total) by mouth daily. 90 tablet 3   levothyroxine (SYNTHROID) 25 MCG tablet TAKE 1 TABLET BY MOUTH EVERY MORNING WITH BREAKFAST (Patient taking differently: Take 25 mcg by mouth every other day.) 90 tablet 3   nitroGLYCERIN (NITROSTAT) 0.4 MG SL tablet PLACE 1 TABLET UNDER THE TONGUE EVERY 5 MINUTES AS NEEDED FOR CHEST PAIN. 25 tablet 3   NON FORMULARY Pt uses a cpap nightly     pantoprazole (PROTONIX) 40 MG tablet Take 1 tablet  (40 mg total) by mouth daily. Does not need refill after 45 days. 45 tablet 0   rosuvastatin (CRESTOR) 10 MG tablet TAKE 1/2 TABLET BY MOUTH EVERY DAY (Patient taking differently: Take 5 mg by mouth every other day.) 45 tablet 2   tiZANidine (ZANAFLEX) 2 MG tablet Take 2 mg by mouth daily as needed for muscle spasms.     Turmeric 500 MG CAPS Take 500 mg by mouth daily.      vitamin B-12 (CYANOCOBALAMIN) 1000 MCG tablet Take 1,000 mcg by mouth every other day.     [DISCONTINUED] ranitidine (ZANTAC) 150 MG tablet TAKE 1 TABLET BY MOUTH 2 TIMES DAILY (Patient taking differently: Take 150 mg by mouth 2 (two) times a day.) 180 tablet 0   No current facility-administered medications on file prior to visit.        ROS:  All others reviewed and negative.  Objective        PE:  BP 138/72 (BP Location: Left Arm, Patient Position: Sitting)   Pulse 60   Ht 5\' 8"  (1.727 m)   Wt 208 lb 3.2 oz (94.4 kg)   SpO2 97%   BMI 31.66 kg/m                 Constitutional: Pt appears in NAD               HENT: Head: NCAT.                Right Ear: External ear normal.                 Left Ear: External ear normal.                Eyes: . Pupils are equal, round, and reactive to light. Conjunctivae and EOM are normal  Nose: without d/c or deformity               Neck: Neck supple. Gross normal ROM               Cardiovascular: Normal rate and regular rhythm.                 Pulmonary/Chest: Effort normal and breath sounds without rales or wheezing.                Abd:  Soft, NT, ND, + BS, no organomegaly               Neurological: Pt is alert. At baseline orientation, motor grossly intact               Skin: Skin is warm. No rashes, no other new lesions, LE edema - none               Psychiatric: Pt behavior is normal without agitation   Micro: none  Cardiac tracings I have personally interpreted today:  none  Pertinent Radiological findings (summarize): none   Lab Results  Component  Value Date   WBC 7.8 06/27/2023   HGB 14.1 06/27/2023   HCT 42.4 06/27/2023   PLT 210 06/27/2023   GLUCOSE 163 (H) 06/27/2023   CHOL 115 06/28/2023   TRIG 131.0 06/28/2023   HDL 46.10 06/28/2023   LDLDIRECT 77.0 06/28/2022   LDLCALC 43 06/28/2023   ALT 28 06/28/2023   AST 28 06/28/2023   NA 140 06/27/2023   K 3.9 06/27/2023   CL 106 06/27/2023   CREATININE 0.88 06/27/2023   BUN 16 06/27/2023   CO2 21 06/27/2023   TSH 4.45 06/28/2023   PSA 1.95 06/28/2022   INR 1.1 09/15/2022   HGBA1C 7.6 (H) 06/28/2023   MICROALBUR 24.0 (H) 06/28/2023   Assessment/Plan:  SCOTTI KOSTA is a 83 y.o. White or Caucasian [1] male with  has a past medical history of Allergy, Anxiety, Aortic stenosis, moderate (03/27/2015), Arthritis, Cancer (HCC), Carotid artery stenosis, Cervical stenosis of spinal canal, CHF (congestive heart failure) (HCC), Closed left pilon fracture, COLONIC POLYPS, HX OF, Complication of anesthesia, Coronary artery disease, Depression, DIVERTICULOSIS, COLON, Dyspnea, Dysrhythmia, Familial tremor (03/19/2014), Family history of adverse reaction to anesthesia, GERD (gastroesophageal reflux disease), GOUT, Headache, Heart murmur, High cholesterol, History of blood transfusion, History of kidney stones, HYPERTENSION, Hypothyroidism, Myocardial infarction (HCC), OSA on CPAP, Pneumonia, 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  [facility] , [facility].  Encounter for well adult exam with abnormal findings Age and sex appropriate education and counseling updated with regular exercise and diet Referrals for preventative services - declines colonscopy and lung ca screening Immunizations addressed - declines covid booster, for shingrix at pharmacy Smoking counseling  - none needed Evidence for depression or other mood disorder - none significant Most  recent labs reviewed. I have personally reviewed and have noted: 1) the patient's medical and social history 2) The patient's current medications and supplements 3) The patient's height, weight, and BMI have been recorded in the chart   Diet-controlled diabetes mellitus (HCC) Lab Results  Component Value Date   HGBA1C 7.6 (H) 06/28/2023   Uncontrolled, goal A1c < 7.5 for age, pt to continue current medical treatment meformn ER 500  -1 every day for now, declines other change   Dyslipidemia Lab Results  Component Value Date  LDLCALC 43 06/28/2023   Stable, pt to continue current statin crestor 5 mg qd   Essential hypertension BP Readings from Last 3 Encounters:  06/28/23 138/72  06/27/23 (!) 160/72  05/18/23 (!) 144/80   Stable, pt to continue medical treatment norvasc 10 qd   Hypothyroidism Lab Results  Component Value Date   TSH 4.45 06/28/2023   Stable, pt to continue levothyroxine 25 mcg qd   Vitamin D deficiency Last vitamin D Lab Results  Component Value Date   VD25OH 25.19 (L) 06/28/2023   Low, to start oral replacement  Followup: Return in about 6 months (around 12/26/2023).  Oliver Barre, MD 07/01/2023 7:42 PM [facility]

## 2023-06-28 NOTE — Assessment & Plan Note (Signed)
[facility] , [facility] with [facility].

## 2023-06-28 NOTE — Telephone Encounter (Signed)
Oh yes he is correct, no need to repeat those tests in particular

## 2023-06-29 ENCOUNTER — Ambulatory Visit: Payer: Medicare Other | Admitting: Podiatry

## 2023-06-29 ENCOUNTER — Encounter: Payer: Self-pay | Admitting: Podiatry

## 2023-06-29 DIAGNOSIS — M79675 Pain in left toe(s): Secondary | ICD-10-CM | POA: Diagnosis not present

## 2023-06-29 DIAGNOSIS — E119 Type 2 diabetes mellitus without complications: Secondary | ICD-10-CM | POA: Diagnosis not present

## 2023-06-29 DIAGNOSIS — M79674 Pain in right toe(s): Secondary | ICD-10-CM

## 2023-06-29 DIAGNOSIS — B351 Tinea unguium: Secondary | ICD-10-CM | POA: Diagnosis not present

## 2023-06-29 NOTE — Progress Notes (Signed)
This patient returns to the office for evaluation and treatment of long thick painful nails .  This patient is unable to trim his own nails since the patient cannot reach the feet.  Patient says the nails are painful walking and wearing his shoes.  He returns for preventive foot care services.    General Appearance  Alert, conversant and in no acute stress.  Vascular  Dorsalis pedis and posterior tibial  pulses are palpable  bilaterally.  Capillary return is within normal limits  bilaterally. Temperature is within normal limits  bilaterally.  Neurologic  Senn-Weinstein monofilament wire test within normal limits  bilaterally. Muscle power within normal limits bilaterally.  Nails Thick disfigured discolored nails with subungual debris  from hallux to fifth toes bilaterally. No evidence of bacterial infection or drainage bilaterally.  Orthopedic  No limitations of motion  feet .  No crepitus or effusions noted.  No bony pathology or digital deformities noted.  Ankle/STJ immobile left foot/ankle.  Fused 1st MPJ right foot.  Skin  normotropic skin with no porokeratosis noted bilaterally.  No signs of infections or ulcers noted.     Onychomycosis  Pain in toes right foot  Pain in toes left foot  Debridement  of nails  1-5  B/L with a nail nipper.  Nails were then filed using a dremel tool with no incidents.    RTC  3 month   [name] DPM 

## 2023-07-01 ENCOUNTER — Encounter: Payer: Self-pay | Admitting: Internal Medicine

## 2023-07-01 NOTE — Assessment & Plan Note (Signed)
Lab Results  Component Value Date   LDLCALC 43 06/28/2023   Stable, pt to continue current statin crestor 5 mg qd

## 2023-07-01 NOTE — Assessment & Plan Note (Signed)
BP Readings from Last 3 Encounters:  06/28/23 138/72  06/27/23 (!) 160/72  05/18/23 (!) 144/80   Stable, pt to continue medical treatment norvasc 10 qd

## 2023-07-01 NOTE — Assessment & Plan Note (Signed)
Last vitamin D Lab Results  Component Value Date   VD25OH 25.19 (L) 06/28/2023   Low, to start oral replacement

## 2023-07-01 NOTE — Assessment & Plan Note (Signed)
Age and sex appropriate education and counseling updated with regular exercise and diet Referrals for preventative services - declines colonscopy and lung ca screening Immunizations addressed - declines covid booster, for shingrix at pharmacy Smoking counseling  - none needed Evidence for depression or other mood disorder - none significant Most recent labs reviewed. I have personally reviewed and have noted: 1) the patient's medical and social history 2) The patient's current medications and supplements 3) The patient's height, weight, and BMI have been recorded in the chart

## 2023-07-01 NOTE — Assessment & Plan Note (Signed)
Lab Results  Component Value Date   TSH 4.45 06/28/2023   Stable, pt to continue levothyroxine 25 mcg qd

## 2023-07-01 NOTE — Assessment & Plan Note (Signed)
Lab Results  Component Value Date   HGBA1C 7.6 (H) 06/28/2023   Uncontrolled, goal A1c < 7.5 for age, pt to continue current medical treatment meformn ER 500  -1 every day for now, declines other change

## 2023-07-04 DIAGNOSIS — N200 Calculus of kidney: Secondary | ICD-10-CM | POA: Diagnosis not present

## 2023-07-04 DIAGNOSIS — R311 Benign essential microscopic hematuria: Secondary | ICD-10-CM | POA: Diagnosis not present

## 2023-07-07 ENCOUNTER — Encounter: Payer: Self-pay | Admitting: Internal Medicine

## 2023-07-07 DIAGNOSIS — E1165 Type 2 diabetes mellitus with hyperglycemia: Secondary | ICD-10-CM

## 2023-07-11 ENCOUNTER — Telehealth: Payer: Self-pay

## 2023-07-11 ENCOUNTER — Ambulatory Visit: Payer: Medicare Other | Admitting: Pulmonary Disease

## 2023-07-11 NOTE — Telephone Encounter (Signed)
 Confirmed procedure date of 07/13/2023. Confirmed arrival time of 0700 for procedure time at 0930. Reviewed pre-procedure instructions with patient's wife. She understands to call if questions/concerns arise prior to procedure.  She was grateful for call and agreed with plan.

## 2023-07-12 ENCOUNTER — Encounter (HOSPITAL_COMMUNITY): Payer: Self-pay | Admitting: Cardiology

## 2023-07-12 NOTE — Progress Notes (Addendum)
 Anesthesia Chart Review: SAME DAY WORK-UP   Case: [id number] Date/Time: 07/13/23 0930   Procedures:      LEFT ATRIAL APPENDAGE OCCLUSION     TRANSESOPHAGEAL ECHOCARDIOGRAM   Anesthesia type: General   Diagnosis: PAF (paroxysmal atrial fibrillation) (HCC) [I48.0]   Pre-op diagnosis: afib   [facility] (C[facility]IOLOGY) / [facility]   Providers: Cindie Ole DASEN, MD       DISCUSSION: Troy Banks is an 83 year old male scheduled for the above procedure. S/p afib ablation 04/07/23. He was in SR as of 06/27/23 follow-up. Given his CYP Enzyme variant, he was not felt to be a a long term OAC candidate, LAA occlusion recommended.    History includes smoking, CAD and AS (NSTEMI, s/p s/p CABG (LIMA-LAD, SVG-INTERMEDIATE, SVG-CX, SVG-dRCA combined with AVR using 23 mm [employer] pericardial tissue valve 03/30/15), CHF, PAF (s/p ablation 04/07/23) dysrhythmia (post-CABG afib, resolved), HTN, hypercholesterolemia, DM (diet controlled), dyspnea, carotid stenosis (1-39% 2021), hypothyroidism, OSA (CPAP), GERD, familial tremor, anxiety, PTSD, skin cancer, osteoarthritis (right THA 10/27/15; left THA 08/13/19), spinal surgery (ACDF 2002; posterior cervical fusion 2006; L2 compression fracture treated with brace 02/2017), cholecystectomy (10/28/22).   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 According to anesthesiologist Dr. Alm Joslin's 03/28/15 note, From a cursory review of the literature, it appears this deficiency can occur in up to 6-10% of the caucasian population and results in reduced activity of the CYP2D^ enzyme.  This deficiency can result in the reduced metabolism of codeine , tramadol, SSRI inhibitors, beta blockers, ondansetron , and lidocaine  among other meds. I have not found any contraindications in the literature to using benzodiazepines,  fentanyl , volatile agents, propofol , muscle relaxants or other commonly used anesthetic agents in patients with this enzyme deficiency. Diazepam (Valium) is very lipid soluble and can accumulate in the body if given for an extended period of time.; please see Dr. Towana note 03/28/15.    - Patient reports he requires increased anesthesia (takes a lot to put me under) and also pt has PTSD, reports will wake up fighting if emerges too rapidly.  - Difficult airway 03/30/15: Mask ventilation with difficulty and oral airway inserted, grade IV view, 2 attempts to place 8.0 ETT, equipment stylet and video laryngoscopy. Difficulty was anticipated due to reduced neck mobility and large tongue. Future recommendations: induction with short-acting agent, and alternative techniques readily available. Comments: First DL c MAC 4 no airway structures  Visualized. Mask ventilated c opa good visualization c glide scope ett inserted s probs Mask ventilation without difficulty, Grade 1 view, 7.5 mm ETT placed with one attempt using Glidescope and 4 on 10/28/22 & 04/07/23.   Anesthesia team to evaluate on the day of procedure.   VS:  BP Readings from Last 3 Encounters:  06/28/23 138/72  06/27/23 (!) 160/72  05/18/23 (!) 144/80   Pulse Readings from Last 3 Encounters:  06/28/23 60  06/27/23 64  05/18/23 60     PROVIDERS: Norleen Lynwood ORN, MD is PCP  Jordan, Peter, MD is cardiologist Cindie Ole, MD is EP cardiologist Selma Cough, MD is urologist   LABS: Most recent results in [facility] include: Lab Results  Component Value Date   WBC 7.8 06/27/2023   HGB 14.1 06/27/2023   HCT 42.4 06/27/2023   PLT 210 06/27/2023   GLUCOSE 163 (H) 06/27/2023   CHOL 115 06/28/2023   TRIG 131.0  06/28/2023   HDL 46.10 06/28/2023   LDLDIRECT 77.0 06/28/2022   LDLCALC 43 06/28/2023   ALT 28 06/28/2023   AST 28 06/28/2023   NA 140 06/27/2023   K 3.9 06/27/2023   CL 106 06/27/2023   CREATININE 0.88 06/27/2023    BUN 16 06/27/2023   CO2 21 06/27/2023   TSH 4.45 06/28/2023   PSA 1.95 06/28/2022   INR 1.1 09/15/2022   HGBA1C 7.6 (H) 06/28/2023   MICROALBUR 24.0 (H) 06/28/2023    IMAGES: CT Chest (over read CCTA) 03/31/23: IMPRESSION: No acute findings in the imaged extracardiac chest. Aortic Atherosclerosis (ICD10-I70.0).    EKG 06/27/23: Sinus rhythm with 1st degree A-V block with Premature atrial complexes Confirmed by Aniceto Jarvis ([id number]) on 06/27/2023 11:08:22 AM   CV: CT Cardiac 03/31/23: IMPRESSION: 1. There is normal pulmonary vein drainage into the left atrium with ostial measurements above. 2. There is no thrombus in the left atrial appendage. 3. The esophagus runs in the left atrial midline and is not in proximity to any of the pulmonary vein ostia. 4. No PFO/ASD. 5. Normal coronary origin. Right dominance. 6. CAC score of 2631 which is 88th percentile for age-, race-, and sex-matched controls. ADDENDUM: - Left atrial appendage landing zone measures 26mm x 18mm in diameter with depth 16mm, suitable for 31mm FLX device - Porcine AVR is present.    Echo 09/16/22: IMPRESSIONS   1. Left ventricular ejection fraction, by estimation, is 60 to 65%. The  left ventricle has normal function. The left ventricle has no regional  wall motion abnormalities. There is moderate left ventricular hypertrophy.  Left ventricular diastolic  parameters are consistent with Grade I diastolic dysfunction (impaired  relaxation).   2. Right ventricular systolic function is normal. The right ventricular  size is normal. There is normal pulmonary artery systolic pressure.   3. No evidence of mitral valve regurgitation.   4. S/p 23 mm [employer] Porcine AVR. V max 2.3 m/s. Mean gradient 12 mmHg.  DI 0.45. No PVL. Normal prosthesis. Aortic valve regurgitation is not  visualized.   5. Aortic aorta is not well visualized.   6. The inferior vena cava is dilated in size with >50% respiratory   variability, suggesting right atrial pressure of 8 mmHg.     LHC 12/03/21:    Ost LAD to Prox LAD lesion is 80% stenosed.   Mid LAD lesion is 100% stenosed.   Ost Cx lesion is 80% stenosed.   Prox RCA to Mid RCA lesion is 100% stenosed.   Ost Ramus-1 lesion is 90% stenosed.   Ost Ramus-2 lesion is 60% stenosed.   Prox Cx-2 lesion is 100% stenosed.   Prox Cx-1 lesion is 80% stenosed.   LIMA and is normal in caliber.   SVG and is large.   SVG and is large.   SVG and is large.   The graft exhibits no disease.   The graft exhibits minimal luminal irregularities.   The graft exhibits minimal luminal irregularities.   The graft exhibits no disease.   3 vessel obstructive CAD.  All grafts are still patent No significant change from 2020.   Plan: recommend medical therapy. Some of his angina could be explained by disease in the native LCx proximal to a relatively small OM. Again this hasn't changed significantly.      Carotid US  12/19/19: Summary:  Right Carotid: Velocities in the right ICA are consistent with a 1-39%  stenosis. The ECA appears >50% stenosed.   Left  Carotid: Velocities in the left ICA are consistent with a 1-39%  stenosis. The ECA appears >50% stenosed.   Vertebrals:  Bilateral vertebral arteries demonstrate antegrade flow.  Subclavians: Normal flow hemodynamics were seen in bilateral subclavian               arteries.    Past Medical History:  Diagnosis Date   Allergy    Anxiety    PTSD   Aortic stenosis, moderate 03/27/2015   post AVR echo Echo 11/16: Mild LVH, EF 55-60%, normal wall motion, grade 1 diastolic dysfunction, bioprosthetic AVR okay (mean gradient 18 mmHg) with trivial AI, mild LAE, atrial septal aneurysm, small effusion    Arthritis    qwhere (10/29/2015)   Cancer (HCC)    skin cancer   Carotid artery stenosis    Carotid US  11/17: 1-39% bilateral ICA stenosis. F/u prn   Cervical stenosis of spinal canal    fusion 2006   CHF (congestive  heart failure) (HCC)    secondry to OHS   Closed left pilon fracture    COLONIC POLYPS, HX OF    Complication of anesthesia    takes a lot to put him under and has woken up during surgery Difficult to wake up . PTSD; does not metabolize RX wellthat he gets violent, per pt.    Coronary artery disease    Depression    DIVERTICULOSIS, COLON    Dyspnea    Dysrhythmia    2 bouts of afib post op and at home but corrected with medication.   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)    confirmed heterozygous 10/24/14 labs   PTSD (post-traumatic stress disorder)    PTSD (post-traumatic stress disorder)    Sleep apnea    On a CPAP    Past Surgical History:  Procedure Laterality Date   ANKLE FUSION Left 03/17/2017   Procedure: FUSION  PILON FRACTURE WITH BONE GRAFTING;  Surgeon: Kendal Franky SQUIBB, MD;  Location: [facility] OR;  Service: Orthopedics;  Laterality: Left;   ANTERIOR FUSION CERVICAL SPINE  2002   AORTIC VALVE REPLACEMENT N/A 03/30/2015   Procedure: AORTIC VALVE REPLACEMENT (AVR);  Surgeon: Dallas KATHEE Jude, MD;  Location: [facility] OR;  Service: Open Heart Surgery;  Laterality: N/A;   ATRIAL FIBRILLATION ABLATION N/A 04/07/2023   Procedure: ATRIAL FIBRILLATION ABLATION;  Surgeon: Cindie Ole DASEN, MD;  [facility];  Service: Cardiovascular;  Laterality: N/A;   CARDIAC CATHETERIZATION N/A 03/26/2015   Procedure: Left Heart  Cath and Coronary Angiography;  Surgeon: Alm LELON Clay, MD;  Location: [facility] INVASIVE CV LAB;  Service: Cardiovascular;  Laterality: N/A;   CARDIAC VALVE REPLACEMENT     CHOLECYSTECTOMY N/A 10/28/2022   Procedure: LAPAROSCOPIC CHOLECYSTECTOMY WITH INTRAOPERATIVE CHOLANGIOGRAM;  Surgeon: Gladis Cough,  MD;  Location: [facility] ORS;  Service: General;  Laterality: N/A;   COLONOSCOPY W/ POLYPECTOMY     CORONARY ARTERY BYPASS GRAFT N/A 03/30/2015   Procedure: CORONARY ARTERY BYPASS GRAFTING (CABG) x four, using left internal mammary artery and left    leg greater saphenous vein harvested endoscopically ;  Surgeon: Dallas KATHEE Jude, MD;  Location: [facility];  Service: Open Heart Surgery;  Laterality: N/A;   CORONARY/GRAFT ANGIOGRAPHY N/A 12/03/2021   Procedure: CORONARY/GRAFT ANGIOGRAPHY;  Surgeon: Jordan, [name], MD;  Location: [facility] INVASIVE CV LAB;  Service: Cardiovascular;  Laterality: N/A;   CYSTOSCOPY/URETEROSCOPY/HOLMIUM LASER/STENT PLACEMENT Bilateral 11/07/2016   Procedure: CYSTOSCOPY/URETEROSCOPY/HOLMIUM LASER/STENT PLACEMENT;  Surgeon: Chauncey Redell Agent, MD;  Location: [facility];  Service: Urology;  Laterality: Bilateral;   CYSTOSCOPY/URETEROSCOPY/HOLMIUM LASER/STENT PLACEMENT Bilateral 11/23/2016   Procedure: CYSTOSCOPY/BILATERAL URETEROSCOPY/HOLMIUM LASER/STENT EXCHANGE;  Surgeon: Chauncey Redell Agent, MD;  Location: [facility];  Service: Urology;  Laterality: Bilateral;  NEEDS 90 MIN    EXTERNAL FIXATION LEG Left 02/22/2017   Procedure: EXTERNAL FIXATION LEFT ANKLE;  Surgeon: Beverley Evalene BIRCH, MD;  [facility];  Service: Orthopedics;  Laterality: Left;   HEMORRHOID BANDING  1970s   INGUINAL HERNIA REPAIR Left 1962   KIDNEY STONE SURGERY  1986   cut me open   LEFT HEART CATH AND CORS/GRAFTS ANGIOGRAPHY N/A 10/25/2018   Procedure: LEFT HEART CATH AND CORS/GRAFTS ANGIOGRAPHY;  Surgeon: Anner Alm ORN, MD;  Location: [facility] INVASIVE CV LAB;  Service: Cardiovascular;  Laterality: N/A;   LEFT HEART CATH AND  CORS/GRAFTS ANGIOGRAPHY N/A 12/03/2021   Procedure: LEFT HEART CATH AND CORS/GRAFTS ANGIOGRAPHY;  Surgeon: Jordan, [name], MD;  Location: [facility] INVASIVE CV LAB;  Service: Cardiovascular;  Laterality: N/A;   POSTERIOR FUSION CERVICAL SPINE  2006   TEE WITHOUT CARDIOVERSION N/A 03/30/2015   Procedure: TRANSESOPHAGEAL ECHOCARDIOGRAM (TEE);  Surgeon: Dallas KATHEE Jude, MD;  Location: [facility] OR;  Service: Open Heart Surgery;  Laterality: N/A;   TESTICLE TORSION REDUCTION Right 1960   TOE FUSION Left 1985 & 2004   great toe and removal   TONSILLECTOMY     TOTAL HIP ARTHROPLASTY Right 10/27/2015   Procedure: RIGHT TOTAL HIP ARTHROPLASTY ANTERIOR APPROACH and steroid injection right foot;  Surgeon: Lonni CINDERELLA Poli, MD;  Location[facility];  Service: Orthopedics;  Laterality: Right;   TOTAL HIP ARTHROPLASTY Left 08/13/2019   TOTAL HIP ARTHROPLASTY Left 08/13/2019   Procedure: LEFT TOTAL HIP ARTHROPLASTY ANTERIOR APPROACH;  Surgeon: Poli Lonni CINDERELLA, MD;  Location: [facility] OR;  Service: Orthopedics;  Laterality: Left;    MEDICATIONS: No current facility-administered medications for this encounter.    allopurinol  (ZYLOPRIM ) 300 MG tablet   amLODipine  (NORVASC ) 10 MG tablet   apixaban  (ELIQUIS ) 5 MG TABS tablet   Ascorbic Acid  (VITAMIN C ) 1000 MG tablet   aspirin  EC 81 MG tablet   Carboxymethylcellulose Sodium (LUBRICANT EYE DROPS OP)   Cholecalciferol  (VITAMIN D -3 PO)   Cyanocobalamin  (VITAMIN B-12) 5000 MCG TBDP   diltiazem  (CARDIZEM  CD) 240 MG 24 hr capsule   famotidine  (PEPCID ) 40 MG tablet   furosemide  (LASIX ) 40 MG tablet   isosorbide  mononitrate (IMDUR ) 60 MG 24 hr tablet   nitroGLYCERIN  (NITROSTAT ) 0.4 MG SL tablet   NON FORMULARY   rosuvastatin  (CRESTOR ) 10 MG tablet   tiZANidine  (ZANAFLEX ) 4 MG tablet   Turmeric 500 MG CAPS   levothyroxine  (SYNTHROID ) 25 MCG tablet   metFORMIN  (GLUCOPHAGE -XR) 500 MG 24 hr tablet   pantoprazole  (PROTONIX ) 40 MG tablet    Isaiah  Ethaniel Garfield,  [name] Surgical Short Stay/Anesthesiology [facility] Phone [phone] [facility] Phone [phone] 07/12/2023 9:45 AM

## 2023-07-12 NOTE — Anesthesia Preprocedure Evaluation (Addendum)
 Anesthesia Evaluation  Patient identified by MRN, date of birth, ID band Patient awake    Reviewed: Allergy & Precautions, NPO status , Patient's Chart, lab work & pertinent test results  History of Anesthesia Complications (+) DIFFICULT AIRWAY, PROLONGED EMERGENCE, Emergence Delirium and history of anesthetic complications  Airway Mallampati: IV  TM Distance: >3 FB Neck ROM: Limited    Dental  (+) Chipped, Dental Advisory Given,    Pulmonary shortness of breath, sleep apnea and Continuous Positive Airway Pressure Ventilation , neg COPD, Current Smoker   breath sounds clear to auscultation       Cardiovascular hypertension, Pt. on medications (-) angina + CAD, + Past MI, + CABG and +CHF  + dysrhythmias Atrial Fibrillation + Valvular Problems/Murmurs  Rhythm:Irregular   1. Left ventricular ejection fraction, by estimation, is 60 to 65%. The  left ventricle has normal function. The left ventricle has no regional  wall motion abnormalities. There is moderate left ventricular hypertrophy.  Left ventricular diastolic  parameters are consistent with Grade I diastolic dysfunction (impaired  relaxation).   2. Right ventricular systolic function is normal. The right ventricular  size is normal. There is normal pulmonary artery systolic pressure.   3. No evidence of mitral valve regurgitation.   4. S/p 23 mm Edwards Porcine AVR. V max 2.3 m/s. Mean gradient 12 mmHg.  DI 0.45. No PVL. Normal prosthesis. Aortic valve regurgitation is not  visualized.   5. Aortic aorta is not well visualized.   6. The inferior vena cava is dilated in size with >50% respiratory  variability, suggesting right atrial pressure of 8 mmHg.     Neuro/Psych  Headaches PSYCHIATRIC DISORDERS Anxiety Depression     Neuromuscular disease CVA, No Residual Symptoms    GI/Hepatic Neg liver ROS,GERD  Medicated and Controlled,,  Endo/Other  diabetesHypothyroidism  Lab  Results      Component                Value               Date                      HGBA1C                   7.6 (H)             06/28/2023             Renal/GU Renal diseaseLab Results      Component                Value               Date                      NA                       140                 06/27/2023                K                        3.9                 06/27/2023                CO2  21                  06/27/2023                GLUCOSE                  163 (H)             06/27/2023                BUN                      16                  06/27/2023                CREATININE               0.88                06/27/2023                CALCIUM                   9.1                 06/27/2023                GFR                      77.83               12/27/2022                EGFR                     87                  06/27/2023                GFRNONAA                 >60                 11/04/2022                Musculoskeletal  (+) Arthritis ,    Abdominal   Peds  Hematology  (+) Blood dyscrasia Lab Results      Component                Value               Date                      WBC                      7.8                 06/27/2023                HGB                      14.1                06/27/2023                HCT  42.4                06/27/2023                MCV                      85                  06/27/2023                PLT                      210                 06/27/2023             eliquis    Anesthesia Other Findings   Reproductive/Obstetrics                              Anesthesia Physical Anesthesia Plan  ASA: 3  Anesthesia Plan: General   Post-op Pain Management: Minimal or no pain anticipated   Induction: Intravenous and Rapid sequence  PONV Risk Score and Plan: 1 and Ondansetron , Dexamethasone , Propofol  infusion and TIVA  Airway Management  Planned: Oral ETT and Video Laryngoscope Planned  Additional Equipment: None  Intra-op Plan:   Post-operative Plan: Extubation in OR  Informed Consent: I have reviewed the patients History and Physical, chart, labs and discussed the procedure including the risks, benefits and alternatives for the proposed anesthesia with the patient or authorized representative who has indicated his/her understanding and acceptance.     Dental advisory given  Plan Discussed with: CRNA  Anesthesia Plan Comments: (PAT note written 07/12/2023 by Allison Zelenak, [name].  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 According to anesthesiologist Dr. Alm Joslin's 03/28/15 note, From a cursory review of the literature, it appears this deficiency can occur in up to 6-10% of the caucasian population and results in reduced activity of the CYP2D^ enzyme.  This deficiency can result in the reduced metabolism of codeine , tramadol, SSRI inhibitors, beta blockers, ondansetron , and lidocaine  among other meds. I have not found any contraindications in the literature to using benzodiazepines, fentanyl , volatile agents, propofol , muscle relaxants or other commonly used anesthetic agents in patients with this enzyme deficiency. Diazepam (Valium) is very lipid soluble and can accumulate in the body if given for an extended period of time.; please see Dr. Towana note 03/28/15.    - Patient reports he requires increased anesthesia (takes a lot to put me under) and also pt has PTSD, reports will wake up fighting if emerges too rapidly.  - Difficult airway 03/30/15: Mask ventilation with difficulty and oral airway inserted, grade IV view, 2 attempts to place 8.0 ETT, equipment stylet and video laryngoscopy. Difficulty was anticipated due to reduced neck mobility and large tongue. Future  recommendations: induction with short-acting agent, and alternative techniques readily available. Comments: First DL c MAC 4 no airway structures  Visualized. Mask ventilated c opa good visualization c glide scope ett inserted s probs Mask ventilation without difficulty, Grade 1 view, 7.5 mm ETT placed with one attempt using Glidescope and 4 on 10/28/22 & 04/07/23. )        Anesthesia Quick Evaluation

## 2023-07-13 ENCOUNTER — Inpatient Hospital Stay (HOSPITAL_COMMUNITY): Payer: Medicare Other | Admitting: Vascular Surgery

## 2023-07-13 ENCOUNTER — Encounter (HOSPITAL_COMMUNITY)
Admission: RE | Disposition: A | Discharge status: Home / Self Care | Payer: Medicare Other | Source: Home / Self Care | Attending: Cardiology

## 2023-07-13 ENCOUNTER — Encounter (HOSPITAL_COMMUNITY): Payer: Self-pay | Admitting: Cardiology

## 2023-07-13 ENCOUNTER — Inpatient Hospital Stay (HOSPITAL_COMMUNITY)
Admission: RE | Admit: 2023-07-13 | Discharge: 2023-07-13 | DRG: 274 | Disposition: A | Discharge status: Home / Self Care | Payer: Medicare Other | Attending: Cardiology | Admitting: Cardiology

## 2023-07-13 ENCOUNTER — Inpatient Hospital Stay (HOSPITAL_COMMUNITY): Payer: Medicare Other

## 2023-07-13 ENCOUNTER — Other Ambulatory Visit: Payer: Self-pay

## 2023-07-13 DIAGNOSIS — I4892 Unspecified atrial flutter: Secondary | ICD-10-CM | POA: Diagnosis present

## 2023-07-13 DIAGNOSIS — G4733 Obstructive sleep apnea (adult) (pediatric): Secondary | ICD-10-CM | POA: Diagnosis present

## 2023-07-13 DIAGNOSIS — I4819 Other persistent atrial fibrillation: Secondary | ICD-10-CM | POA: Diagnosis present

## 2023-07-13 DIAGNOSIS — I700 Atherosclerosis of aorta: Secondary | ICD-10-CM | POA: Diagnosis not present

## 2023-07-13 DIAGNOSIS — Z006 Encounter for examination for normal comparison and control in clinical research program: Secondary | ICD-10-CM | POA: Diagnosis not present

## 2023-07-13 DIAGNOSIS — Z96643 Presence of artificial hip joint, bilateral: Secondary | ICD-10-CM | POA: Diagnosis present

## 2023-07-13 DIAGNOSIS — Z7901 Long term (current) use of anticoagulants: Secondary | ICD-10-CM | POA: Diagnosis not present

## 2023-07-13 DIAGNOSIS — I2510 Atherosclerotic heart disease of native coronary artery without angina pectoris: Secondary | ICD-10-CM | POA: Diagnosis not present

## 2023-07-13 DIAGNOSIS — Z79890 Hormone replacement therapy (postmenopausal): Secondary | ICD-10-CM

## 2023-07-13 DIAGNOSIS — Z981 Arthrodesis status: Secondary | ICD-10-CM

## 2023-07-13 DIAGNOSIS — S2241XA Multiple fractures of ribs, right side, initial encounter for closed fracture: Secondary | ICD-10-CM | POA: Diagnosis not present

## 2023-07-13 DIAGNOSIS — Z7902 Long term (current) use of antithrombotics/antiplatelets: Secondary | ICD-10-CM

## 2023-07-13 DIAGNOSIS — F4310 Post-traumatic stress disorder, unspecified: Secondary | ICD-10-CM | POA: Diagnosis present

## 2023-07-13 DIAGNOSIS — I5032 Chronic diastolic (congestive) heart failure: Secondary | ICD-10-CM | POA: Diagnosis present

## 2023-07-13 DIAGNOSIS — Z952 Presence of prosthetic heart valve: Secondary | ICD-10-CM

## 2023-07-13 DIAGNOSIS — I4891 Unspecified atrial fibrillation: Secondary | ICD-10-CM | POA: Diagnosis present

## 2023-07-13 DIAGNOSIS — I480 Paroxysmal atrial fibrillation: Secondary | ICD-10-CM

## 2023-07-13 DIAGNOSIS — Z85828 Personal history of other malignant neoplasm of skin: Secondary | ICD-10-CM

## 2023-07-13 DIAGNOSIS — Z87442 Personal history of urinary calculi: Secondary | ICD-10-CM | POA: Diagnosis not present

## 2023-07-13 DIAGNOSIS — Z860100 Personal history of colon polyps, unspecified: Secondary | ICD-10-CM | POA: Diagnosis not present

## 2023-07-13 DIAGNOSIS — I110 Hypertensive heart disease with heart failure: Secondary | ICD-10-CM | POA: Diagnosis present

## 2023-07-13 DIAGNOSIS — Z953 Presence of xenogenic heart valve: Secondary | ICD-10-CM

## 2023-07-13 DIAGNOSIS — E039 Hypothyroidism, unspecified: Secondary | ICD-10-CM | POA: Diagnosis present

## 2023-07-13 DIAGNOSIS — F32A Depression, unspecified: Secondary | ICD-10-CM | POA: Diagnosis present

## 2023-07-13 DIAGNOSIS — Z01818 Encounter for other preprocedural examination: Secondary | ICD-10-CM | POA: Diagnosis not present

## 2023-07-13 DIAGNOSIS — G250 Essential tremor: Secondary | ICD-10-CM | POA: Diagnosis present

## 2023-07-13 DIAGNOSIS — K219 Gastro-esophageal reflux disease without esophagitis: Secondary | ICD-10-CM | POA: Diagnosis present

## 2023-07-13 DIAGNOSIS — I252 Old myocardial infarction: Secondary | ICD-10-CM | POA: Diagnosis not present

## 2023-07-13 DIAGNOSIS — Z8673 Personal history of transient ischemic attack (TIA), and cerebral infarction without residual deficits: Secondary | ICD-10-CM | POA: Diagnosis not present

## 2023-07-13 DIAGNOSIS — Z79899 Other long term (current) drug therapy: Secondary | ICD-10-CM

## 2023-07-13 DIAGNOSIS — Z951 Presence of aortocoronary bypass graft: Secondary | ICD-10-CM

## 2023-07-13 DIAGNOSIS — E7800 Pure hypercholesterolemia, unspecified: Secondary | ICD-10-CM | POA: Diagnosis present

## 2023-07-13 DIAGNOSIS — E119 Type 2 diabetes mellitus without complications: Secondary | ICD-10-CM | POA: Diagnosis present

## 2023-07-13 DIAGNOSIS — Z9049 Acquired absence of other specified parts of digestive tract: Secondary | ICD-10-CM

## 2023-07-13 DIAGNOSIS — F17210 Nicotine dependence, cigarettes, uncomplicated: Secondary | ICD-10-CM

## 2023-07-13 DIAGNOSIS — E8889 Other specified metabolic disorders: Secondary | ICD-10-CM | POA: Diagnosis present

## 2023-07-13 DIAGNOSIS — I10 Essential (primary) hypertension: Secondary | ICD-10-CM | POA: Diagnosis present

## 2023-07-13 DIAGNOSIS — Z95818 Presence of other cardiac implants and grafts: Principal | ICD-10-CM

## 2023-07-13 DIAGNOSIS — I25119 Atherosclerotic heart disease of native coronary artery with unspecified angina pectoris: Secondary | ICD-10-CM | POA: Diagnosis present

## 2023-07-13 DIAGNOSIS — Z7982 Long term (current) use of aspirin: Secondary | ICD-10-CM

## 2023-07-13 HISTORY — DX: Presence of other cardiac implants and grafts: Z95.818

## 2023-07-13 HISTORY — PX: LEFT ATRIAL APPENDAGE OCCLUSION: EP1229

## 2023-07-13 HISTORY — PX: TRANSESOPHAGEAL ECHOCARDIOGRAM (CATH LAB): EP1270

## 2023-07-13 LAB — TYPE AND SCREEN
ABO/RH(D): B POS
Antibody Screen: NEGATIVE

## 2023-07-13 LAB — ECHO TEE
AR max vel: 1.64 cm2
AV Area VTI: 1.72 cm2
AV Area mean vel: 1.79 cm2
AV Mean grad: 19 mm[Hg]
AV Peak grad: 38.7 mm[Hg]
Ao pk vel: 3.11 m/s

## 2023-07-13 LAB — SURGICAL PCR SCREEN
MRSA, PCR: NEGATIVE
Staphylococcus aureus: POSITIVE — AB

## 2023-07-13 LAB — POCT ACTIVATED CLOTTING TIME: Activated Clotting Time: 291 s

## 2023-07-13 LAB — GLUCOSE, CAPILLARY: Glucose-Capillary: 150 mg/dL — ABNORMAL HIGH (ref 70–99)

## 2023-07-13 SURGERY — LEFT ATRIAL APPENDAGE OCCLUSION
Anesthesia: General

## 2023-07-13 MED ORDER — DEXMEDETOMIDINE HCL IN NACL 80 MCG/20ML IV SOLN
INTRAVENOUS | Status: DC | PRN
  Administered 2023-07-13: 8 ug via INTRAVENOUS
  Administered 2023-07-13: 4 ug via INTRAVENOUS
  Administered 2023-07-13: 28 ug via INTRAVENOUS

## 2023-07-13 MED ORDER — FENTANYL CITRATE (PF) 100 MCG/2ML IJ SOLN
INTRAMUSCULAR | Status: AC
  Filled 2023-07-13: qty 2

## 2023-07-13 MED ORDER — HEPARIN SODIUM (PORCINE) 1000 UNIT/ML IJ SOLN
INTRAMUSCULAR | Status: DC | PRN
  Administered 2023-07-13: 3000 [IU] via INTRAVENOUS
  Administered 2023-07-13: 14000 [IU] via INTRAVENOUS

## 2023-07-13 MED ORDER — PHENYLEPHRINE HCL-NACL 20-0.9 MG/250ML-% IV SOLN
INTRAVENOUS | Status: DC | PRN
  Administered 2023-07-13: 20 ug/min via INTRAVENOUS

## 2023-07-13 MED ORDER — SODIUM CHLORIDE 0.9 % IV SOLN: INTRAVENOUS | Status: DC

## 2023-07-13 MED ORDER — SODIUM CHLORIDE 0.9 % IV SOLN: 250.0000 mL | INTRAVENOUS | Status: DC | PRN

## 2023-07-13 MED ORDER — ONDANSETRON HCL 4 MG/2ML IJ SOLN
INTRAMUSCULAR | Status: DC | PRN
  Administered 2023-07-13: 4 mg via INTRAVENOUS

## 2023-07-13 MED ORDER — PROPOFOL 10 MG/ML IV BOLUS
INTRAVENOUS | Status: DC | PRN
  Administered 2023-07-13: 50 mg via INTRAVENOUS
  Administered 2023-07-13: 150 mg via INTRAVENOUS

## 2023-07-13 MED ORDER — SUGAMMADEX SODIUM 200 MG/2ML IV SOLN
INTRAVENOUS | Status: DC | PRN
  Administered 2023-07-13: 200 mg via INTRAVENOUS

## 2023-07-13 MED ORDER — FENTANYL CITRATE (PF) 250 MCG/5ML IJ SOLN
INTRAMUSCULAR | Status: DC | PRN
  Administered 2023-07-13: 100 ug via INTRAVENOUS

## 2023-07-13 MED ORDER — CHLORHEXIDINE GLUCONATE 4 % EX SOLN
Freq: Once | CUTANEOUS | Status: DC
  Filled 2023-07-13: qty 15

## 2023-07-13 MED ORDER — SUCCINYLCHOLINE CHLORIDE 200 MG/10ML IV SOSY
PREFILLED_SYRINGE | INTRAVENOUS | Status: DC | PRN
  Administered 2023-07-13: 180 mg via INTRAVENOUS

## 2023-07-13 MED ORDER — IOHEXOL 350 MG/ML SOLN
INTRAVENOUS | Status: DC | PRN
  Administered 2023-07-13: 10 mL

## 2023-07-13 MED ORDER — SODIUM CHLORIDE 0.9% FLUSH: 3.0000 mL | INTRAVENOUS | Status: DC | PRN

## 2023-07-13 MED ORDER — PROPOFOL 500 MG/50ML IV EMUL
INTRAVENOUS | Status: DC | PRN
  Administered 2023-07-13: 125 ug/kg/min via INTRAVENOUS
  Administered 2023-07-13: 30 mg via INTRAVENOUS

## 2023-07-13 MED ORDER — PROTAMINE SULFATE 10 MG/ML IV SOLN
INTRAVENOUS | Status: DC | PRN
  Administered 2023-07-13: 30 mg via INTRAVENOUS

## 2023-07-13 MED ORDER — LIDOCAINE 2% (20 MG/ML) 5 ML SYRINGE
INTRAMUSCULAR | Status: DC | PRN
  Administered 2023-07-13: 80 mg via INTRAVENOUS

## 2023-07-13 MED ORDER — CEFAZOLIN SODIUM-DEXTROSE 2-4 GM/100ML-% IV SOLN
2.0000 g | INTRAVENOUS | Status: AC
  Administered 2023-07-13: 2 g via INTRAVENOUS
  Filled 2023-07-13: qty 100

## 2023-07-13 MED ORDER — ACETAMINOPHEN 325 MG PO TABS: 650.0000 mg | ORAL_TABLET | ORAL | Status: DC | PRN

## 2023-07-13 SURGICAL SUPPLY — 21 items
BAG SNAP BAND KOVER 36X36 (MISCELLANEOUS) ×1 IMPLANT
CATH INFINITI 5FR ANG PIGTAIL (CATHETERS) ×1 IMPLANT
CATH NUVISION NON NAV ICE (CATHETERS) ×1 IMPLANT
CLOSURE PERCLOSE PROSTYLE (VASCULAR PRODUCTS) ×3 IMPLANT
COVER SWIFTLINK CONNECTOR (BAG) ×1 IMPLANT
DEVICE WATCHMAN FLX PRO PROC (KITS) IMPLANT
DEVICE WATCHMAN FXD CRV PROC (KITS) IMPLANT
DILATOR VESSEL 38 20CM 16FR (INTRODUCER) ×1 IMPLANT
INTRODUCER PERFORM 12 30 .038 (SHEATH) ×1 IMPLANT
KIT SHEA VERSACROSS LAAC CONNE (KITS) ×1 IMPLANT
PACK CARDIAC CATHETERIZATION (CUSTOM PROCEDURE TRAY) ×2 IMPLANT
PAD DEFIB RADIO PHYSIO CONN (PAD) ×2 IMPLANT
SHEATH PERFORMER 16FR 30 (SHEATH) ×1 IMPLANT
SHEATH PINNACLE 8F 10CM (SHEATH) ×1 IMPLANT
SHEATH PROBE COVER 6X72 (BAG) ×2 IMPLANT
SYS WATCHMAN FXD DBL (SHEATH) ×1 IMPLANT
SYSTEM WATCHMAN FXD DBL (SHEATH) IMPLANT
TRANSDUCER W/STOPCOCK (MISCELLANEOUS) ×2 IMPLANT
WATCHMAN FLX PRO 27 (Prosthesis & Implant Heart) ×1 IMPLANT
WATCHMAN FLX PRO PROCEDURE (KITS) ×1 IMPLANT
WATCHMAN FXD CRV SYS PROCEDURE (KITS) ×1 IMPLANT

## 2023-07-13 NOTE — Discharge Instructions (Signed)
 [facility] Procedure, Care After  Procedure MD: Dr. Isidoro Donning Clinical Coordinator: Karsten Fells, RN  This sheet gives you information about how to care for yourself after your procedure. Your health care provider may also give you more specific instructions. If you have problems or questions, contact your health care provider.  What can I expect after the procedure? After the procedure, it is common to have: Bruising around your puncture site. Tenderness around your puncture site. Tiredness (fatigue).  Medication instructions It is very important to continue to take your blood thinner as directed by your doctor after the Watchman procedure. Call your procedure doctor's office with question or concerns. If you are on Coumadin (warfarin), you will have your INR checked the week after your procedure, with a goal INR of 2.0 - 3.0. Please follow your medication instructions on your discharge summary. Only take the medications listed on your discharge paperwork.  Follow up You will be seen in 1 month after your procedure  You will have a repeat CT scan approximately 8 weeks after your procedure mark to check your device You will follow up the MD/APP who performed your procedure 6 months after your procedure The Watchman Clinical Coordinator will check in with you from time to time, including 1 and 2 years after your procedure.    Follow these instructions at home: Puncture site care  Follow instructions from your health care provider about how to take care of your puncture site. Make sure you: If present, leave stitches (sutures), skin glue, or adhesive strips in place.  If a large square bandage is present, this may be removed 24 hours after surgery.  Check your puncture site every day for signs of infection. Check for: Redness, swelling, or pain. Fluid or blood. If your puncture site starts to bleed, lie down on your back, apply firm pressure to the area, and contact your health  care provider. Warmth. Pus or a bad smell. Driving Do not drive yourself home if you received sedation Do not drive for at least 4 days after your procedure or however long your health care provider recommends. (Do not resume driving if you have previously been instructed not to drive for other health reasons.) Do not spend greater than 1 hour at a time in a car for the first 3 days. Stop and take a break with a 5 minute walk at least every hour.  Do not drive or use heavy machinery while taking prescription pain medicine.  Activity Avoid activities that take a lot of effort, including exercise, for at least 7 days after your procedure. For the first 3 days, avoid sitting for longer than one hour at a time.  Avoid alcoholic beverages, signing paperwork, or participating in legal proceedings for 24 hours after receiving sedation Do not lift anything that is heavier than 10 lb (4.5 kg) for one week.  No sexual activity for 1 week.  Return to your normal activities as told by your health care provider. Ask your health care provider what activities are safe for you. General instructions Take over-the-counter and prescription medicines only as told by your health care provider. Do not use any products that contain nicotine or tobacco, such as cigarettes and e-cigarettes. If you need help quitting, ask your health care provider. You may shower after 24 hours, but Do not take baths, swim, or use a hot tub for 1 week.  Do not drink alcohol for 24 hours after your procedure. Keep all follow-up visits as told by  your health care provider. This is important. Dental Work: You will require antibiotics prior to any dental work, including cleanings, for 6 months after your Watchman implantation to help protect you from infection. After 6 months, antibiotics are no longer required. Contact a health care provider if: You have redness, mild swelling, or pain around your puncture site. You have soreness in  your throat or at your puncture site that does not improve after several days You have fluid or blood coming from your puncture site that stops after applying firm pressure to the area. Your puncture site feels warm to the touch. You have pus or a bad smell coming from your puncture site. You have a fever. You have chest pain or discomfort that spreads to your neck, jaw, or arm. You are sweating a lot. You feel nauseous. You have a fast or irregular heartbeat. You have shortness of breath. You are dizzy or light-headed and feel the need to lie down. You have pain or numbness in the arm or leg closest to your puncture site. Get help right away if: Your puncture site suddenly swells. Your puncture site is bleeding and the bleeding does not stop after applying firm pressure to the area. These symptoms may represent a serious problem that is an emergency. Do not wait to see if the symptoms will go away. Get medical help right away. Call your local emergency services (911 in the [place] Do not drive yourself to the hospital. Summary After the procedure, it is normal to have bruising and tenderness at the puncture site in your groin, neck, or forearm. Check your puncture site every day for signs of infection. Get help right away if your puncture site is bleeding and the bleeding does not stop after applying firm pressure to the area. This is a medical emergency.  This information is not intended to replace advice given to you by your health care provider. Make sure you discuss any questions you have with your health care provider.

## 2023-07-13 NOTE — Discharge Summary (Signed)
 [facility]    Patient ID: Troy Banks,  MRN: [id number], DOB/AGE: 81-Feb-1942 81 y.o.  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 Holts.   This study demonstrated: CONCLUSIONS:  1.Successful implantation of a WATCHMAN left atrial appendage occlusive device    2. TEE demonstrating no LAA thrombus 3. ICE demonstrating trivial pericardial effusion 4. No early apparent complications.    Post Implant Anticoagulation Strategy: Continue Eliquis  5mg  and ASA 81mg  daily for 45 days after implant. After 45 days, stop Eliquis  and start Plavix  75mg  by mouth once daily to complete 6 months of post implant therapy. Plan for CT scan 60 days after implant to assess appendage patency and watchman position.   Brief HPI: Troy Banks is a 83 y.o. male with a history of AFL, AF, SB, CAD s/p CABG 2016, chronic dCHF, HTN, CVA, OSA on CPAP, GERD, hypothyroidism, and DM2 who presented to [facility] today for elective LAAO.    Troy Banks underwent AF / AFL ablation 04/07/2023. Subsequently he followed in the AF Clinic and was maintaining SR. Given his CYP Enzyme variant, he is not a long term OAC candidate and was planned for LAAO with Watchman 07/13/23.   Hospital Course:  The patient was admitted and underwent left atrial appendage occlusive device placement with 27mm Watchman FLX Pro device. He was monitored in the post procedure setting and has done very well with no concerns. Given this, he is being considered for same day discharge later  today. Groin site has been stable without evidence of hematoma or bleeding. Wound care and restrictions were reviewed with the patient. The patient has been scheduled for post procedure follow up with a provider in approximately 1 month. He will restart Eliquis  and ASA 81mg  this evening and continue for 45 days (08/27/23) then stop Eliquis . At that time he will transition to Plavix  75mg  daily to complete 6 months of therapy. He will require dental SBE during this time and should refrain from dental work or cleanings for at least 30 days post implant. SBE to be RXd at follow up. A repeat CT will be performed in approximately 60 days to ensure proper seal of the device.   Physical Exam: Vitals:   07/13/23 1230 07/13/23 1245 07/13/23 1305 07/13/23 1342  BP: 109/66 (!) 115/59 126/72 138/67  Pulse: (!) 47 (!) 48 (!) 45 (!) 46  Resp: 16 11 16 16   Temp: 98.3 F (36.8 C)   (!) 97.5 F (36.4 C)  TempSrc: Temporal   Oral  SpO2: 93% 95% 95% 96%  Weight:      Height:       Labs:   Lab Results  Component Value Date   WBC 7.8 06/27/2023   HGB 14.1 06/27/2023   HCT 42.4 06/27/2023   MCV 85 06/27/2023   PLT 210 06/27/2023   No results for input(s): NA, K, CL, CO2, BUN, CREATININE, CALCIUM , PROT, BILITOT, ALKPHOS, ALT, AST, GLUCOSE in the last 168 hours.  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   Tramadol Other (See Comments)   Accumulates and causes stroke-like side-effects due to 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   Atorvastatin  Other (See Comments)   MYALGIAS   Colchicine  Other (See Comments)   ataxia   Fentanyl  Other (See Comments)   Accumulates and cause stroke like symptoms   Hydrocodone  Other (See Comments)   Accumulates and causes stroke-like side-effects due to 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 Notes to patient: Restart Eliquis   TONIGHT, 07/13/23   aspirin  EC 81 MG tablet Take 81 mg by mouth every other day. Swallow whole.   diltiazem  240 MG 24 hr capsule Commonly known as: CARDIZEM  CD TAKE 1 CAPSULE BY MOUTH DAILY   famotidine  40 MG tablet Commonly known as: PEPCID  Take 1 tablet (40 mg total) by mouth daily.   furosemide  40 MG tablet Commonly known as: LASIX  TAKE 1 TABLET BY MOUTH EVERY OTHER DAY   isosorbide  mononitrate 60 MG 24 hr tablet Commonly known as: IMDUR  Take 1 tablet (60 mg total) by mouth daily.   levothyroxine  25 MCG tablet Commonly known as: SYNTHROID  TAKE 1 TABLET BY MOUTH EVERY MORNING WITH BREAKFAST   LUBRICANT EYE DROPS OP Place 1 drop into both eyes 4 (four) times daily as needed (dry/irritated eyes.). Ocean fresh   metFORMIN  500 MG 24 hr tablet Commonly known as: GLUCOPHAGE -XR Take 1 tablet (500 mg total) by mouth daily with breakfast.   nitroGLYCERIN  0.4 MG SL tablet Commonly known as: NITROSTAT  PLACE 1 TABLET UNDER THE TONGUE EVERY 5 MINUTES AS NEEDED FOR CHEST PAIN.   NON FORMULARY Pt uses a cpap nightly   pantoprazole  40 MG tablet Commonly  known as: PROTONIX  Take 1 tablet (40 mg total) by mouth daily. Does not need refill after 45 days.   rosuvastatin  10 MG tablet Commonly known as: CRESTOR  TAKE 1/2 TABLET BY MOUTH EVERY DAY   tiZANidine  4 MG tablet Commonly known as: ZANAFLEX  Take 4 mg by mouth daily as needed for muscle spasms.   Turmeric 500 MG Caps Take 500 mg by mouth daily.   Vitamin B-12 5000 MCG Tbdp Take 5,000 mcg by mouth every other day.   vitamin C  1000 MG tablet Take 1,000 mg by mouth daily.   VITAMIN D -3 PO Take 2,000 Units by mouth every other day.       Disposition: Home  Discharge Instructions     Call MD for:  difficulty breathing, headache or visual disturbances   Complete by: As directed    Call MD for:  hives   Complete by: As directed    Call MD for:  persistant dizziness or light-headedness   Complete by: As directed     Call MD for:  persistant nausea and vomiting   Complete by: As directed    Call MD for:  redness, tenderness, or signs of infection (pain, swelling, redness, odor or green/yellow discharge around incision site)   Complete by: As directed    Call MD for:  severe uncontrolled pain   Complete by: As directed    Call MD for:  temperature >100.4   Complete by: As directed    Diet - low sodium heart healthy   Complete by: As directed    Discharge instructions   Complete by: As directed    [facility] Procedure, Care After  Procedure MD: Dr. Cindie Olds Clinical Coordinator: Rockie Redman, RN  This sheet gives you information about how to care for yourself after your procedure. Your health care provider may also give you more specific instructions. If you have problems or questions, contact your health care provider.  What can I expect after the procedure? After the procedure, it is common to have: Bruising around your puncture site. Tenderness around your puncture site. Tiredness (fatigue).  Medication instructions It is very important to continue to take your blood thinner as directed by your doctor after the Watchman procedure. Call your procedure doctor's office with question or concerns. If you are on Coumadin  (warfarin), you will have your INR checked the week after your procedure, with a goal INR of 2.0 - 3.0. Please follow your medication instructions on your discharge summary. Only take the medications listed on your discharge paperwork.  Follow up You will be seen in 1 month after your procedure You will have a repeat CT scan approximately 8 weeks after your procedure mark to check your device You will follow up the MD/APP who performed your procedure 6 months after your procedure The Watchman Clinical Coordinator will check in with you from time to time, including 1 and 2 years after your procedure.    Follow these instructions at home: Puncture site care  Follow  instructions from your health care provider about how to take care of your puncture site. Make sure you: If present, leave stitches (sutures), skin glue, or adhesive strips in place.  If a large square bandage is present, this may be removed 24 hours after surgery.  Check your puncture site every day for signs of infection. Check for: Redness, swelling, or pain. Fluid or blood. If your puncture site starts to bleed, lie down on your back, apply firm pressure to the area, and  contact your health care provider. Warmth. Pus or a bad smell. Driving Do not drive yourself home if you received sedation Do not drive for at least 4 days after your procedure or however long your health care provider recommends. (Do not resume driving if you have previously been instructed not to drive for other health reasons.) Do not spend greater than 1 hour at a time in a car for the first 3 days. Stop and take a break with a 5 minute walk at least every hour.  Do not drive or use heavy machinery while taking prescription pain medicine.  Activity Avoid activities that take a lot of effort, including exercise, for at least 7 days after your procedure. For the first 3 days, avoid sitting for longer than one hour at a time.  Avoid alcoholic beverages, signing paperwork, or participating in legal proceedings for 24 hours after receiving sedation Do not lift anything that is heavier than 10 lb (4.5 kg) for one week.  No sexual activity for 1 week.  Return to your normal activities as told by your health care provider. Ask your health care provider what activities are safe for you. General instructions Take over-the-counter and prescription medicines only as told by your health care provider. Do not use any products that contain nicotine or tobacco, such as cigarettes and e-cigarettes. If you need help quitting, ask your health care provider. You may shower after 24 hours, but Do not take baths, swim, or use a hot tub for  1 week.  Do not drink alcohol for 24 hours after your procedure. Keep all follow-up visits as told by your health care provider. This is important. Dental Work: You will require antibiotics prior to any dental work, including cleanings, for 6 months after your Watchman implantation to help protect you from infection. After 6 months, antibiotics are no longer required. Contact a health care provider if: You have redness, mild swelling, or pain around your puncture site. You have soreness in your throat or at your puncture site that does not improve after several days You have fluid or blood coming from your puncture site that stops after applying firm pressure to the area. Your puncture site feels warm to the touch. You have pus or a bad smell coming from your puncture site. You have a fever. You have chest pain or discomfort that spreads to your neck, jaw, or arm. You are sweating a lot. You feel nauseous. You have a fast or irregular heartbeat. You have shortness of breath. You are dizzy or light-headed and feel the need to lie down. You have pain or numbness in the arm or leg closest to your puncture site. Get help right away if: Your puncture site suddenly swells. Your puncture site is bleeding and the bleeding does not stop after applying firm pressure to the area. These symptoms may represent a serious problem that is an emergency. Do not wait to see if the symptoms will go away. Get medical help right away. Call your local emergency services (911 in the [place] Do not drive yourself to the hospital. Summary After the procedure, it is normal to have bruising and tenderness at the puncture site in your groin, neck, or forearm. Check your puncture site every day for signs of infection. Get help right away if your puncture site is bleeding and the bleeding does not stop after applying firm pressure to the area. This is a medical emergency.  This information is not intended to replace  advice  given to you by your health care provider. Make sure you discuss any questions you have with your health care provider.   Increase activity slowly   Complete by: As directed        Follow-up Information     Fenton, Clint R, PA Follow up on 08/22/2023.   Specialty: Cardiology Why: Your follow up appointment is on 08/22/23 at [facility] in the [facility]. Please plan to arrive by 1:45PM Contact information: [address] [place] [address] [phone]                Duration of Discharge Encounter:  APP Time: 40 minutes   Signed, Kate Minus, NP  07/13/2023 3:04 PM

## 2023-07-13 NOTE — Progress Notes (Signed)
  Echocardiogram Echocardiogram Transesophageal has been performed.  Troy Banks 07/13/2023, 11:45 AM

## 2023-07-13 NOTE — Anesthesia Procedure Notes (Signed)
 Procedure Name: Intubation Date/Time: 07/13/2023 10:22 AM  Performed by: Cindie Ronal POUR, CRNAPre-anesthesia Checklist: Patient identified, Emergency Drugs available, Suction available and Patient being monitored Patient Re-evaluated:Patient Re-evaluated prior to induction Oxygen Delivery Method: Circle System Utilized Preoxygenation: Pre-oxygenation with 100% oxygen Induction Type: IV induction Ventilation: Mask ventilation with difficulty Laryngoscope Size: Glidescope and 4 (glidescope s4 used d/t previous difficult airways) Grade View: Grade I Tube type: Oral Tube size: 7.5 mm Number of attempts: 1 Airway Equipment and Method: Stylet Placement Confirmation: ETT inserted through vocal cords under direct vision, positive ETCO2 and breath sounds checked- equal and bilateral Secured at: 24 cm Tube secured with: Tape Dental Injury: Teeth and Oropharynx as per pre-operative assessment

## 2023-07-13 NOTE — Transfer of Care (Signed)
 Immediate Anesthesia Transfer of Care Note  Patient: Troy Banks  Procedure(s) Performed: LEFT ATRIAL APPENDAGE OCCLUSION TRANSESOPHAGEAL ECHOCARDIOGRAM  Patient Location: PACU  Anesthesia Type:General  Level of Consciousness: awake and sedated  Airway & Oxygen Therapy: Patient Spontanous Breathing and Patient connected to face mask oxygen  Post-op Assessment: Report given to RN and Post -op Vital signs reviewed and stable  Post vital signs: Reviewed and stable  Last Vitals:  Vitals Value Taken Time  BP 107/66 07/13/23 1200  Temp    Pulse 47 07/13/23 1201  Resp 17 07/13/23 1201  SpO2 95 % 07/13/23 1201  Vitals shown include unfiled device data.  Last Pain:  Vitals:   07/13/23 0746  TempSrc:   PainSc: 0-No pain         Complications: No notable events documented.

## 2023-07-13 NOTE — Plan of Care (Signed)
  Problem: Education: Goal: Knowledge of cardiac device and self-care will improve Outcome: Adequate for Discharge Goal: Ability to safely manage health related needs after discharge will improve Outcome: Adequate for Discharge Goal: Individualized Educational Video(s) Outcome: Adequate for Discharge   Problem: Cardiac: Goal: Ability to achieve and maintain adequate cardiopulmonary perfusion will improve Outcome: Adequate for Discharge   Problem: Education: Goal: Knowledge of General Education information will improve Description: Including pain rating scale, medication(s)/side effects and non-pharmacologic comfort measures Outcome: Adequate for Discharge   Problem: Health Behavior/Discharge Planning: Goal: Ability to manage health-related needs will improve Outcome: Adequate for Discharge   Problem: Clinical Measurements: Goal: Ability to maintain clinical measurements within normal limits will improve Outcome: Adequate for Discharge Goal: Will remain free from infection Outcome: Adequate for Discharge Goal: Diagnostic test results will improve Outcome: Adequate for Discharge Goal: Respiratory complications will improve Outcome: Adequate for Discharge Goal: Cardiovascular complication will be avoided Outcome: Adequate for Discharge   Problem: Activity: Goal: Risk for activity intolerance will decrease Outcome: Adequate for Discharge   Problem: Nutrition: Goal: Adequate nutrition will be maintained Outcome: Adequate for Discharge   Problem: Coping: Goal: Level of anxiety will decrease Outcome: Adequate for Discharge   Problem: Elimination: Goal: Will not experience complications related to bowel motility Outcome: Adequate for Discharge Goal: Will not experience complications related to urinary retention Outcome: Adequate for Discharge   Problem: Pain Managment: Goal: General experience of comfort will improve and/or be controlled Outcome: Adequate for Discharge    Problem: Safety: Goal: Ability to remain free from injury will improve Outcome: Adequate for Discharge   Problem: Skin Integrity: Goal: Risk for impaired skin integrity will decrease Outcome: Adequate for Discharge

## 2023-07-13 NOTE — H&P (Signed)
 Electrophysiology Office Note:     Date:  07/13/2023    ID:  Troy Banks, DOB May 04, 1941, MRN [id number]   [facility] Cardiologist:  Peter Jordan, MD  [facility] Electrophysiologist:  OLE ONEIDA HOLTS, MD    Referring MD: Norleen Lynwood ORN, MD    Chief Complaint: Atrial fibrillation   History of Present Illness:     Troy Banks is a 83 y.o. male who I am seeing today for an opinion regarding atrial fibrillation at the request of Dr. Delford.  The patient has a history of coronary artery disease post CABG in 2016, aortic stenosis post AVR in 2016 and atrial fibrillation flutter since 2016 on amiodarone .  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   He was hospitalized in April 2024 with A-fib with RVR.  The patient saw Barnie Hila on September 27, 2022.  The patient previously took primidone  for his essential tremor but this was stopped due to drug interactions with his DOAC.  The patient has previously declined Coumadin .   He is with his wife today in clinic who is a retired CHARITY FUNDR[employer]ISER from [employer]. The patient's AF episodes are rare but highly symptomatic.   Presents for [employer] implant today. Procedure reviewed.       Objective Their past medical, social and family history was reveiwed.     ROS:   Please see the history of present illness.    All other systems reviewed and are negative.   EKGs/Labs/Other Studies Reviewed:     The following studies were reviewed today:   April 2024 echo EF 60 to 65% RV function normal Aortic valve prosthesis functioning appropriately       Physical Exam:     VS:  BP 148/60   Pulse 63   Ht 5' 8 (1.727 m)   Wt 198 lb 3.2 oz (89.9 kg)   SpO2 96%   BMI 30.14 kg/m         Wt Readings from Last 3 Encounters:  11/25/22 198 lb 3.2 oz (89.9 kg)  11/16/22 200 lb (90.7 kg)  11/04/22 200 lb (90.7 kg)      GEN:  Well nourished,  well developed in no acute distress CARDIAC: RRR, no murmurs, rubs, gallops RESPIRATORY:  Clear to auscultation without rales, wheezing or rhonchi      Assessment ASSESSMENT AND PLAN:     1. Paroxysmal atrial fibrillation (HCC)   2. Coronary artery disease of native artery of native heart with stable angina pectoris (HCC)   3. S/P CABG x 4   4. S/P AVR       #Persistent atrial fibrillation and flutter Symptomatic.  Led to recent hospitalization. He is now taking a stable anticoagulant with Eliquis . I discussed treatment options for his atrial fibrillation and stroke risk mitigation strategies during today's clinic appointment.  I discussed atrial fibrillation and flutter ablation in detail including the risks, success rates and recovery.  His antiarrhythmic drug options are limited given his other medications.  He would like to proceed with catheter ablation which I think is reasonable.   Discussed treatment options today for AF and AFL including antiarrhythmic drug therapy and ablation. Discussed risks, recovery and likelihood of success with each treatment strategy. Risk, benefits, and alternatives to EP study and radiofrequency ablation for afib were discussed. These risks include but are not limited to stroke, bleeding, vascular damage, tamponade, perforation, damage to the esophagus, lungs, phrenic  nerve and other structures, pulmonary vein stenosis, worsening renal function, and death.  Discussed potential need for repeat ablation procedures and antiarrhythmic drugs after an initial ablation. The patient understands these risk and wishes to proceed.  We will therefore proceed with catheter ablation at the next available time.  Carto, ICE, anesthesia are requested for the procedure.  Will also obtain CT PV protocol prior to the procedure to exclude LAA thrombus and further evaluate atrial anatomy.   ---------------------   I have seen Troy Banks in the office today who is being  considered for a Watchman left atrial appendage closure device. I believe they will benefit from this procedure given their history of atrial fibrillation, CHA2DS2-VASc score of 8 and unadjusted ischemic stroke rate of 10.8% per year. Unfortunately, the patient is not felt to be a long term anticoagulation candidate secondary to history of CYP mutation, drug-drug interactions. The patient's chart has been reviewed and I feel that they would be a candidate for short term oral anticoagulation after Watchman implant.    It is my belief that after undergoing a LAA closure procedure, Troy Banks will not need long term anticoagulation which eliminates anticoagulation side effects and major bleeding risk.    Procedural risks for the Watchman implant have been reviewed with the patient including a 0.5% risk of stroke, <1% risk of perforation and <1% risk of device embolization. Other risks include bleeding, vascular damage, tamponade, worsening renal function, and death. The patient understands these risk and wishes to proceed.       The published clinical data on the safety and effectiveness of WATCHMAN include but are not limited to the following: - Holmes DR, Jess BEARD, Sick P et al. for the PROTECT AF Investigators. Percutaneous closure of the left atrial appendage versus warfarin therapy for prevention of stroke in patients with atrial fibrillation: a randomised non-inferiority trial. Lancet 2009; 374: 534-42. GLENWOOD Jess BEARD, Doshi SK, Jonita VEAR Satchel D et al. on behalf of the PROTECT AF Investigators. Percutaneous Left Atrial Appendage Closure for Stroke Prophylaxis in Patients With Atrial Fibrillation 2.3-Year Follow-up of the PROTECT AF (Watchman Left Atrial Appendage System for Embolic Protection in Patients With Atrial Fibrillation) Trial. Circulation 2013; 127:720-729. - Alli O, Doshi S,  Kar S, Reddy VY, Sievert H et al. Quality of Life Assessment in the Randomized PROTECT AF (Percutaneous Closure  of the Left Atrial Appendage Versus Warfarin Therapy for Prevention of Stroke in Patients With Atrial Fibrillation) Trial of Patients at Risk for Stroke With Nonvalvular Atrial Fibrillation. J Am Coll Cardiol 2013; 61:1790-8. GLENWOOD Satchel DR, Archer RAMAN, Price M, Whisenant B, Sievert H, Doshi S, Huber K, Reddy V. Prospective randomized evaluation of the Watchman left atrial appendage Device in patients with atrial fibrillation versus long-term warfarin therapy; the PREVAIL trial. Journal of the [employer] of Cardiology, Vol. 4, No. 1, 2014, 1-11. - Kar S, Doshi SK, Sadhu A, Horton R, Osorio J et al. Primary outcome evaluation of a next-generation left atrial appendage closure device: results from the PINNACLE FLX trial. Circulation 2021;143(18)1754-1762.      After today's visit with the patient which was dedicated solely for shared decision making visit regarding LAA closure device, the patient decided to proceed with the LAA appendage closure procedure scheduled to be done in the near future [facility]. Prior to the procedure, I would like to obtain a gated CT scan of the chest with contrast timed for PV/LA visualization.      HAS-BLED  score 4 Hypertension Yes  Abnormal renal and liver function (Dialysis, transplant, Cr >2.26 mg/dL /Cirrhosis or Bilirubin >2x Normal or AST/ALT/AP >3x Normal) No  Stroke Yes  Bleeding No  Labile INR (Unstable/high INR) No  Elderly (>65) Yes  Drugs or alcohol (>= 8 drinks/week, anti-plt or NSAID) Yes    CHA2DS2-VASc Score = 8  The patient's score is based upon: CHF History: 1 HTN History: 1 Diabetes History: 1 Stroke History: 2 Vascular Disease History: 1 Age Score: 2 Gender Score: 0     Presents for LAAO today under TEE/ICE guidance. Procedure reviewed.       Signed, Ole DASEN. Cindie, MD, [facility], [facility] 07/13/2023 Electrophysiology [facility]

## 2023-07-14 ENCOUNTER — Telehealth: Payer: Self-pay | Admitting: Cardiology

## 2023-07-14 ENCOUNTER — Encounter (HOSPITAL_COMMUNITY): Payer: Self-pay | Admitting: Cardiology

## 2023-07-14 ENCOUNTER — Telehealth: Payer: Self-pay | Admitting: *Deleted

## 2023-07-14 DIAGNOSIS — I480 Paroxysmal atrial fibrillation: Secondary | ICD-10-CM

## 2023-07-14 DIAGNOSIS — Z95818 Presence of other cardiac implants and grafts: Secondary | ICD-10-CM

## 2023-07-14 MED FILL — Fentanyl Citrate Preservative Free (PF) Inj 100 MCG/2ML: INTRAMUSCULAR | Qty: 2 | Status: AC

## 2023-07-14 NOTE — Transitions of Care (Post Inpatient/ED Visit) (Signed)
 07/14/2023  Name: Troy Banks MRN: [id number] DOB: 01/02/1941  Today's TOC FU Call Status: Today's TOC FU Call Status:: Successful TOC FU Call Completed TOC FU Call Complete Date: 07/14/23 Patient's Name and Date of Birth confirmed.  Transition Care Management Follow-up Telephone Call Date of Discharge: 07/13/23 Discharge Facility: [facility]) Type of Discharge: Inpatient Admission Primary Inpatient Discharge Diagnosis:: Elective Watchman's Device Implant secondary to AF How have you been since you were released from the hospital?: Better Any questions or concerns?: Yes Patient Questions/Concerns:: Discrepancy in discharge provider notes re: Eliquis  dosing/ ASA scheduling; plan to resume clopidogrel  post-procedure: Spouse is retired Statistician good understanding of plan for medication: she proactively verbalizes plans to clarify/ discuss with discharging provider at time of scheduled follow up appointment Patient Questions/Concerns Addressed: Other: (reviewed discharge notes with spouse; confirmed she understands instructions for ACT post- elective procedure yesterday:  declined TOC 30-day program; provided my direct contact information should questions/ concerns/ needs arise post-TOC call)  Items Reviewed: Did you receive and understand the discharge instructions provided?: Yes (thoroughly reviewed with patient's spouse who is an [employer] who verbalizes good understanding of same) Medications obtained,verified, and reconciled?: Yes (Medications Reviewed) (Full medication reconciliation/ review completed; no concerns or discrepancies identified; confirmed patient obtained/ is taking all newly Rx'd medications as instructed; spouse-manages medications and denies questions/ concerns around medications today) Any new allergies since your discharge?: No Dietary orders reviewed?: Yes Type of Diet Ordered:: Heart Healthy, low salt Do you have support at home?: Yes People in Home:  spouse Name of Support/Comfort Primary Source: Reports independent in self-care activities; supportive spouse is an [employer] and assists as/ if needed/ indicated  Medications Reviewed Today: Medications Reviewed Today     Reviewed by Raquon Milledge M, RN (Registered Nurse) on 07/14/23 at 1459  Med List Status: <None>   Medication Order Taking? Sig Documenting Provider Last Dose Status Informant  allopurinol  (ZYLOPRIM ) 300 MG tablet [id number] Yes TAKE 1 TABLET BY MOUTH ONCE DAILY Norleen Lynwood ORN, MD Taking Active Spouse/Significant Other  amLODipine  (NORVASC ) 10 MG tablet [id number] Yes TAKE 1 TABLET BY MOUTH EVERY DAY Jordan, [name], MD Taking Active Spouse/Significant Other  apixaban  (ELIQUIS ) 5 MG TABS tablet [id number] Yes Take 1 tablet (5 mg total) by mouth 2 (two) times daily. Loistine Sober, NP Taking Active Spouse/Significant Other  Ascorbic Acid  (VITAMIN C ) 1000 MG tablet [id number] Yes Take 1,000 mg by mouth daily. [provider] Taking Active Spouse/Significant Other  aspirin  EC 81 MG tablet [id number] Yes Take 81 mg by mouth every other day. Swallow whole. [provider] Taking Active Spouse/Significant Other  Carboxymethylcellulose Sodium (LUBRICANT EYE DROPS OP) [id number] Yes Place 1 drop into both eyes 4 (four) times daily as needed (dry/irritated eyes.). Ocean fresh [provider] Taking Active Spouse/Significant Other  Cholecalciferol  (VITAMIN D -3 PO) [id number] Yes Take 2,000 Units by mouth every other day. [provider] Taking Active Spouse/Significant Other  Cyanocobalamin  (VITAMIN B-12) 5000 MCG TBDP [id number] Yes Take 5,000 mcg by mouth every other day. [provider] Taking Active Spouse/Significant Other  diltiazem  (CARDIZEM  CD) 240 MG 24 hr capsule [id number] Yes TAKE 1 CAPSULE BY MOUTH DAILY Monge, Damien BROCKS, NP Taking Active Spouse/Significant Other  famotidine  (PEPCID ) 40 MG tablet [id number] Yes Take 1 tablet (40 mg total) by mouth  daily. Norleen Lynwood ORN, MD Taking Active Spouse/Significant Other  furosemide  (LASIX ) 40 MG tablet [id number] Yes TAKE 1 TABLET BY MOUTH EVERY OTHER DAY Jordan, [name], MD Taking  Active Spouse/Significant Other  isosorbide  mononitrate (IMDUR ) 60 MG 24 hr tablet [id number] Yes Take 1 tablet (60 mg total) by mouth daily. Loistine Sober, NP Taking Active Spouse/Significant Other  levothyroxine  (SYNTHROID ) 25 MCG tablet [id number] Yes TAKE 1 TABLET BY MOUTH EVERY MORNING WITH BREAKFAST Norleen Lynwood ORN, MD Taking Active Spouse/Significant Other  metFORMIN  (GLUCOPHAGE -XR) 500 MG 24 hr tablet [id number] No Take 1 tablet (500 mg total) by mouth daily with breakfast.  Patient not taking: Reported on 07/06/2023   Norleen Lynwood ORN, MD Not Taking Active Spouse/Significant Other           Med Note (Chandlar Staebell M   Fri Jul 14, 2023  2:34 PM) 07/14/23: Reports during TOC call- not currently taking- looking at other options; to see endocrinology provider next week   nitroGLYCERIN  (NITROSTAT ) 0.4 MG SL tablet [id number] Yes PLACE 1 TABLET UNDER THE TONGUE EVERY 5 MINUTES AS NEEDED FOR CHEST PAIN. Jordan, [name], MD Taking Active Spouse/Significant Other  NON FORMULARY [id number] Yes Pt uses a cpap nightly [provider] Taking Active Spouse/Significant Other  pantoprazole  (PROTONIX ) 40 MG tablet [id number] No Take 1 tablet (40 mg total) by mouth daily. Does not need refill after 45 days.  Patient not taking: Reported on 07/06/2023   Aniceto Daphne CROME, NP Not Taking Active Spouse/Significant Other           Med Note MARLAND BEATRIS HERO   Fri Jul 14, 2023  2:59 PM) 07/14/23- Spouse reports during [facility] call, no longer taking for quite awhile    Patient taking differently:   Discontinued 12/03/18 0858 rosuvastatin  (CRESTOR ) 10 MG tablet [id number] Yes TAKE 1/2 TABLET BY MOUTH EVERY DAY  Patient taking differently: Take 5 mg by mouth daily.   Lavona Lynwood, MD Taking Active Spouse/Significant Other  tiZANidine   (ZANAFLEX ) 4 MG tablet [id number] Yes Take 4 mg by mouth daily as needed for muscle spasms. [provider] Taking Active Spouse/Significant Other  Turmeric 500 MG CAPS [id number] Yes Take 500 mg by mouth daily.  [provider] Taking Active Spouse/Significant Other           Home Care and Equipment/Supplies: Were Home Health Services Ordered?: No Any new equipment or medical supplies ordered?: No  Functional Questionnaire: Do you need assistance with bathing/showering or dressing?: No Do you need assistance with meal preparation?: No Do you need assistance with eating?: No Do you have difficulty maintaining continence: No Do you need assistance with getting out of bed/getting out of a chair/moving?: No Do you have difficulty managing or taking your medications?: Yes (spouse manages all aspects of medication administration)  Follow up appointments reviewed: PCP Follow-up appointment confirmed?: NA (thoroughly reviewed with patient's spouse/ RN who verbalizes good understanding of same) Specialist Hospital Follow-up appointment confirmed?: Yes Date of Specialist follow-up appointment?: 08/22/23 (verified this is recommended time frame for follow up per hospital discharging provider notes) Follow-Up Specialty Provider:: A-Fib provider Do you need transportation to your follow-up appointment?: No Do you understand care options if your condition(s) worsen?: Yes-patient verbalized understanding  SDOH Interventions Today    Flowsheet Row Most Recent Value  SDOH Interventions   Food Insecurity Interventions Intervention Not Indicated  Housing Interventions Intervention Not Indicated  Transportation Interventions Intervention Not Indicated  [Spouse provides transportation]  Utilities Interventions Intervention Not Indicated      Interventions Today    Flowsheet Row Most Recent Value  Chronic Disease   Chronic disease during today's visit Atrial Fibrillation (AFib),  Other  [Watchman Device  Implant]  General Interventions   General Interventions Discussed/Reviewed General Interventions Discussed, Durable Medical Equipment (DME), Doctor Visits  Doctor Visits Discussed/Reviewed Doctor Visits Discussed, Specialist  Durable Medical Equipment (DME) Other  [confirmed not currently requiring/ using assistive devices for ambulation]  PCP/Specialist Visits Compliance with follow-up visit  Education Interventions   Education Provided Provided Education  Provided Verbal Education On Other, Medication  [thorough review of discharge notes with patient's spouse (is an [employer]ER):  discussed discrepancies noted in AVS and discharge notes: spouse aware/ reports will follow up with specialist-- she verbalizes excellent understanding of post-procuedure plan of care]  Nutrition Interventions   Nutrition Discussed/Reviewed Nutrition Discussed  Pharmacy Interventions   Pharmacy Dicussed/Reviewed Pharmacy Topics Discussed  [Full medication review with updating medication list in EHR per patient report]      TOC Interventions Today    Flowsheet Row Most Recent Value  TOC Interventions   TOC Interventions Discussed/Reviewed TOC Interventions Discussed  [Patient's spouse declines need for ongoing/ further care management outreach,  declines enrollment in 30-day TOC program,  provided my direct contact information should questions/ concerns/ needs arise post-TOC call]      Total time spent from review to signing of note/ including any care coordination interventions:  56 minutes- patient's spouse is RN: extensive review of discharge notes/ discrepancies in note instructions from AVS instructions  Beatris Blinda Lawrence, RN, BSN, CCRN Alumnus RN Care Manager  Transitions of Care  [facility] Population [facility]  [place] [phone]: direct office

## 2023-07-14 NOTE — Telephone Encounter (Signed)
  [facility]   Watchman Team  Contacted the patient regarding discharge from [facility] on 07/13/23 s/p LAAO   The patient understands to follow up with Atrial Fibrillation Clinic on 08/22/23  The patient understands discharge instructions? Yes  The patient understands medications and regimen? Yes   The patient reports groin site looks stable with no issues   The patient understands to call with any questions or concerns prior to scheduled visit.

## 2023-07-14 NOTE — Anesthesia Postprocedure Evaluation (Signed)
 Anesthesia Post Note  Patient: Troy Banks  Procedure(s) Performed: LEFT ATRIAL APPENDAGE OCCLUSION TRANSESOPHAGEAL ECHOCARDIOGRAM     Patient location during evaluation: Cath Lab Anesthesia Type: General Level of consciousness: awake and alert Pain management: pain level controlled Vital Signs Assessment: post-procedure vital signs reviewed and stable Respiratory status: spontaneous breathing, nonlabored ventilation and respiratory function stable Cardiovascular status: blood pressure returned to baseline and stable Postop Assessment: no apparent nausea or vomiting Anesthetic complications: no   No notable events documented.  Last Vitals:  Vitals:   07/13/23 1430 07/13/23 1445  BP: 103/72 (!) 143/74  Pulse: (!) 51 (!) 52  Resp:    Temp:    SpO2: 92% 95%    Last Pain:  Vitals:   07/13/23 1342  TempSrc: Oral  PainSc: 0-No pain                 Mykaylah Ballman

## 2023-07-18 ENCOUNTER — Other Ambulatory Visit: Payer: Self-pay | Admitting: Student

## 2023-07-19 DIAGNOSIS — E1165 Type 2 diabetes mellitus with hyperglycemia: Secondary | ICD-10-CM | POA: Diagnosis not present

## 2023-07-28 DIAGNOSIS — L738 Other specified follicular disorders: Secondary | ICD-10-CM | POA: Diagnosis not present

## 2023-07-28 DIAGNOSIS — B354 Tinea corporis: Secondary | ICD-10-CM | POA: Diagnosis not present

## 2023-07-28 DIAGNOSIS — L280 Lichen simplex chronicus: Secondary | ICD-10-CM | POA: Diagnosis not present

## 2023-07-28 DIAGNOSIS — L570 Actinic keratosis: Secondary | ICD-10-CM | POA: Diagnosis not present

## 2023-07-28 DIAGNOSIS — L821 Other seborrheic keratosis: Secondary | ICD-10-CM | POA: Diagnosis not present

## 2023-07-31 ENCOUNTER — Encounter: Payer: Self-pay | Admitting: Dietician

## 2023-07-31 ENCOUNTER — Encounter: Payer: Medicare Other | Attending: Internal Medicine | Admitting: Dietician

## 2023-07-31 DIAGNOSIS — E119 Type 2 diabetes mellitus without complications: Secondary | ICD-10-CM | POA: Diagnosis not present

## 2023-07-31 NOTE — Progress Notes (Signed)
 Diabetes Self-Management Education  Visit Type: First/Initial  Appt. Start Time: 1045 Appt. End Time: 1200  07/31/2023  Mr. Troy Banks, identified by name and date of birth, is a 83 y.o. male with a diagnosis of Diabetes: Type 2.   ASSESSMENT Pt wife, Dois Davenport, present for appointment. Pt wife reports pt started on Ozempic 2 weeks ago @0 .25 mg, no side effects reported, wife states she did not want him going on metformin. Pt reports having good support system from their family. Pt wife reports checking pt FBG daily, numbers currently 120 -150s Wife reports pt is limited in their physical activity r/t BL hip replacement and Chronic migraines. Pt reports mild neuropathy, checks feet regularly and visits podiatry every 3 months, no sores/wounds/redness reported. Pt wife reports inconsistent meal timing r/t irregular sleep schedule, migraines. Typical meal consists of lean proteins (chicken, pork chop, fish, shrimp, hamburger dish), starches (rice, potatoes)   Diabetes Self-Management Education - 07/31/23 1054       Visit Information   Visit Type First/Initial      Initial Visit   Diabetes Type Type 2    Date Diagnosed 2016    Are you taking your medications as prescribed? Yes      Psychosocial Assessment   Patient Belief/Attitude about Diabetes Motivated to manage diabetes    What is the hardest part about your diabetes right now, causing you the most concern, or is the most worrisome to you about your diabetes?   Being active;Making healty food and beverage choices    Self-care barriers None    Self-management support Family;Doctor's office    Other persons present Patient;Spouse/SO    Patient Concerns Nutrition/Meal planning    Special Needs None    Preferred Learning Style No preference indicated    Learning Readiness Ready    How often do you need to have someone help you when you read instructions, pamphlets, or other written materials from your doctor or pharmacy? 1 -  Never    What is the last grade level you completed in school? 2 yrs college      Pre-Education Assessment   Patient understands the diabetes disease and treatment process. Needs Instruction    Patient understands incorporating nutritional management into lifestyle. Needs Instruction    Patient undertands incorporating physical activity into lifestyle. Needs Instruction    Patient understands using medications safely. Needs Instruction    Patient understands monitoring blood glucose, interpreting and using results Needs Instruction    Patient understands prevention, detection, and treatment of acute complications. Needs Instruction    Patient understands prevention, detection, and treatment of chronic complications. Needs Instruction    Patient understands how to develop strategies to address psychosocial issues. Needs Instruction    Patient understands how to develop strategies to promote health/change behavior. Needs Instruction      Complications   Last HgB A1C per patient/outside source 7.6 %   06/28/2023   How often do you check your blood sugar? 1-2 times/day    Fasting Blood glucose range (mg/dL) 161-096    Have you had a dilated eye exam in the past 12 months? Yes    Have you had a dental exam in the past 12 months? No    Are you checking your feet? Yes   Podiatrist manages foot care   How many days per week are you checking your feet? 7      Dietary Intake   Breakfast 2 eggs, Bologna, Coffee    Lunch None  Dinner 4 slices, thin crust mushroom, green peppers, pineapple, and ham pizza. Cheerwine ZERO    Beverage(s) Water, coffee,      Activity / Exercise   Activity / Exercise Type ADL's    How many days per week do you exercise? 0    How many minutes per day do you exercise? 0    Total minutes per week of exercise 0      Patient Education   Previous Diabetes Education Yes (please comment)    Disease Pathophysiology Explored patient's options for treatment of their  diabetes;Definition of diabetes, type 1 and 2, and the diagnosis of diabetes    Healthy Eating Food label reading, portion sizes and measuring food.;Plate Method;Role of diet in the treatment of diabetes and the relationship between the three main macronutrients and blood glucose level    Medications Reviewed patients medication for diabetes, action, purpose, timing of dose and side effects.    Monitoring Identified appropriate SMBG and/or A1C goals.    Chronic complications Relationship between chronic complications and blood glucose control;Assessed and discussed foot care and prevention of foot problems;Lipid levels, blood glucose control and heart disease    Diabetes Stress and Support Identified and addressed patients feelings and concerns about diabetes;Worked with patient to identify barriers to care and solutions      Individualized Goals (developed by patient)   Nutrition Follow meal plan discussed;General guidelines for healthy choices and portions discussed    Physical Activity Not Applicable    Medications take my medication as prescribed    Monitoring  Test my blood glucose as discussed    Problem Solving Eating Pattern    Reducing Risk examine blood glucose patterns;do foot checks daily      Post-Education Assessment   Patient understands the diabetes disease and treatment process. Needs Review    Patient understands incorporating nutritional management into lifestyle. Needs Review    Patient undertands incorporating physical activity into lifestyle. N/A    Patient understands using medications safely. Comphrehends key points    Patient understands monitoring blood glucose, interpreting and using results Comprehends key points    Patient understands prevention, detection, and treatment of acute complications. Needs Review    Patient understands prevention, detection, and treatment of chronic complications. Comprehends key points    Patient understands how to develop strategies to  address psychosocial issues. Comprehends key points    Patient understands how to develop strategies to promote health/change behavior. Comprehends key points      Outcomes   Expected Outcomes Demonstrated interest in learning. Expect positive outcomes    Future DMSE 2 months    Program Status Not Completed             Individualized Plan for Diabetes Self-Management Training:   Learning Objective:  Patient will have a greater understanding of diabetes self-management. Patient education plan is to attend individual and/or group sessions per assessed needs and concerns.   Plan:   Patient Instructions  Check your blood sugar each morning before eating or drinking (fasting). Look for numbers under 130 mg/dL  Your goal Z6X is below 7.0%  Work towards eating three meals a day, about 5-6 hours apart!  Begin to recognize carbohydrates, proteins, and non-starchy vegetables in your food choices!  Begin to build your meals using the proportions of the Balanced Plate. First, select your carb choice(s) for the meal. Make this 25% of your meal.Continue to choose whole grains and starchy vegetables. Next, select your source of protein to pair  with your carb choice(s). Make this another 25% of your meal. Continue to choose lean proteins. Finally, complete your meal with a variety of non-starchy vegetables. Make this the remaining 50% of your meal.  Use your arm-chair exercise hand out for ideas to add some valuable activity to your day!  Expected Outcomes:  Demonstrated interest in learning. Expect positive outcomes  Education material provided: My Plate, Arm Chair Exercises  If problems or questions, patient to contact team via:  Phone and Email  Future DSME appointment: 2 months

## 2023-07-31 NOTE — Patient Instructions (Addendum)
 Check your blood sugar each morning before eating or drinking (fasting). Look for numbers under 130 mg/dL  Your goal Z6X is below 7.0%  Work towards eating three meals a day, about 5-6 hours apart!  Begin to recognize carbohydrates, proteins, and non-starchy vegetables in your food choices!  Begin to build your meals using the proportions of the Balanced Plate. First, select your carb choice(s) for the meal. Make this 25% of your meal.Continue to choose whole grains and starchy vegetables. Next, select your source of protein to pair with your carb choice(s). Make this another 25% of your meal. Continue to choose lean proteins. Finally, complete your meal with a variety of non-starchy vegetables. Make this the remaining 50% of your meal.  Use your arm-chair exercise hand out for ideas to add some valuable activity to your day!

## 2023-08-08 ENCOUNTER — Ambulatory Visit: Payer: Medicare Other | Admitting: Family Medicine

## 2023-08-09 ENCOUNTER — Other Ambulatory Visit: Payer: Self-pay | Admitting: Internal Medicine

## 2023-08-09 ENCOUNTER — Ambulatory Visit: Admitting: Internal Medicine

## 2023-08-09 ENCOUNTER — Encounter: Payer: Self-pay | Admitting: Internal Medicine

## 2023-08-09 ENCOUNTER — Ambulatory Visit

## 2023-08-09 VITALS — BP 166/80 | HR 70 | Temp 98.1°F | Ht 68.0 in | Wt 194.0 lb

## 2023-08-09 DIAGNOSIS — Z7985 Long-term (current) use of injectable non-insulin antidiabetic drugs: Secondary | ICD-10-CM

## 2023-08-09 DIAGNOSIS — M25552 Pain in left hip: Secondary | ICD-10-CM | POA: Diagnosis not present

## 2023-08-09 DIAGNOSIS — E1165 Type 2 diabetes mellitus with hyperglycemia: Secondary | ICD-10-CM | POA: Insufficient documentation

## 2023-08-09 DIAGNOSIS — I480 Paroxysmal atrial fibrillation: Secondary | ICD-10-CM | POA: Diagnosis not present

## 2023-08-09 DIAGNOSIS — Z7984 Long term (current) use of oral hypoglycemic drugs: Secondary | ICD-10-CM

## 2023-08-09 DIAGNOSIS — Z96643 Presence of artificial hip joint, bilateral: Secondary | ICD-10-CM | POA: Diagnosis not present

## 2023-08-09 DIAGNOSIS — M25551 Pain in right hip: Secondary | ICD-10-CM | POA: Diagnosis not present

## 2023-08-09 DIAGNOSIS — E119 Type 2 diabetes mellitus without complications: Secondary | ICD-10-CM

## 2023-08-09 DIAGNOSIS — M7071 Other bursitis of hip, right hip: Secondary | ICD-10-CM

## 2023-08-09 DIAGNOSIS — G8929 Other chronic pain: Secondary | ICD-10-CM | POA: Diagnosis not present

## 2023-08-09 MED ORDER — METHYLPREDNISOLONE 4 MG PO TBPK: ORAL_TABLET | ORAL | 0 refills | Status: DC

## 2023-08-09 NOTE — Patient Instructions (Addendum)
 You had the steroid shot today  Please take all new medication as prescribed  - the medrol pack  Please continue all other medications as before, and refills have been done if requested.  Please have the pharmacy call with any other refills you may need.  Please continue your efforts at being more active, low cholesterol diet, and weight control.  You are otherwise up to date with prevention measures today.  Please keep your appointments with your specialists as you may have planned  You will be contacted regarding the referral for: orthopedic - Dr Delynn Flavin asap  Please go to the XRAY Department in the first floor for the x-ray testing  You will be contacted by phone if any changes need to be made immediately.  Otherwise, you will receive a letter about your results with an explanation, but please check with MyChart first.

## 2023-08-09 NOTE — Progress Notes (Signed)
 Patient ID: Troy Banks, male   DOB: Nov 03, 1940, 83 y.o.   MRN: [id number]        Chief Complaint: follow up post hospn Jul 13 2023 with wife       HPI:  Troy Banks is a 83 y.o. male here with above now s/p Watchman to LAA appendage, now restarted eliquis and asa , with plan to change eliquis to plavix at 45 days , and f/u CT at 60 days.  Pt denies chest pain, increased sob or doe, wheezing, orthopnea, PND, increased LE swelling, palpitations, dizziness or syncope.   Pt denies polydipsia, polyuria, or new focal neuro s/s.   Unfortunately today his main issue is a flare of right lower back pain and right lateral hip pain, has been seeing ortho Dr Meyer Russel for SI joint issue, and today also with marked pain sore tender to right lateral hip worse to lie on right side at night.  No falls or trauma.  No overt bleeding or fever.  Pt is s/p bilateral hip THR.         Wt Readings from Last 3 Encounters:  08/09/23 194 lb (88 kg)  07/13/23 204 lb (92.5 kg)  06/28/23 208 lb 3.2 oz (94.4 kg)   BP Readings from Last 3 Encounters:  08/09/23 (!) 166/80  07/13/23 (!) 143/74  06/28/23 138/72         Past Medical History:  Diagnosis Date   Allergy    Anxiety    PTSD   Aortic stenosis, moderate 03/27/2015   post AVR echo Echo 11/16: Mild LVH, EF 55-60%, normal wall motion, grade 1 diastolic dysfunction, bioprosthetic AVR okay (mean gradient 18 mmHg) with trivial AI, mild LAE, atrial septal aneurysm, small effusion    Arthritis    "qwhere" (10/29/2015)   Cancer (HCC)    skin cancer   Carotid artery stenosis    Carotid US 11/17: 1-39% bilateral ICA stenosis. F/u prn   Cervical stenosis of spinal canal    fusion 2006   CHF (congestive heart failure) (HCC)    "secondry to [facility]"   Closed left pilon fracture    COLONIC POLYPS, HX OF    Complication of anesthesia    "takes a lot to put him under and has woken up during surgery" Difficult to wake up . PTSD; "does not metabolize RX well"that  he gets "violent", per pt.    Coronary artery disease    Depression    Difficult intubation 03/30/2015   Glidescop and 4 used 10/28/22, 04/07/23 due to history   DIVERTICULOSIS, COLON    Dyspnea    Dysrhythmia    2 bouts of afib post op and at home but corrected with medication.   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 Lalla Brothers   PTSD (post-traumatic stress disorder)    PTSD (post-traumatic stress disorder)    Sleep apnea    On a CPAP   Past Surgical History:  Procedure Laterality Date   ANKLE FUSION Left 03/17/2017   Procedure: FUSION  PILON FRACTURE WITH BONE GRAFTING;  Surgeon: Roby Lofts, MD;  Location: [facility] OR;  Service: Orthopedics;  Laterality: Left;   ANTERIOR FUSION CERVICAL SPINE  2002   AORTIC VALVE REPLACEMENT N/A 03/30/2015   Procedure: AORTIC VALVE REPLACEMENT (AVR);  Surgeon: Delight Ovens, MD;  Location: [facility] OR;  Service: Open Heart Surgery;  Laterality: N/A;   ATRIAL FIBRILLATION ABLATION N/A 04/07/2023   Procedure: ATRIAL FIBRILLATION ABLATION;  Surgeon: Lanier Prude, MD;  Location: [facility] INVASIVE CV LAB;  Service: Cardiovascular;  Laterality: N/A;   CARDIAC CATHETERIZATION N/A 03/26/2015   Procedure: Left Heart Cath and Coronary Angiography;  Surgeon: Marykay Lex, MD;  Location: [facility] INVASIVE CV LAB;  Service: Cardiovascular;  Laterality: N/A;   CARDIAC VALVE REPLACEMENT     CHOLECYSTECTOMY N/A 10/28/2022   Procedure: LAPAROSCOPIC CHOLECYSTECTOMY WITH INTRAOPERATIVE CHOLANGIOGRAM;  Surgeon: Luretha Murphy, MD;  Location: [facility] ORS;  Service: General;  Laterality: N/A;   COLONOSCOPY W/ POLYPECTOMY     CORONARY ARTERY BYPASS GRAFT N/A 03/30/2015   Procedure: CORONARY ARTERY BYPASS GRAFTING (CABG) x four, using left internal mammary artery and left    leg greater saphenous vein harvested endoscopically ;  Surgeon: Delight Ovens, MD;  Location: [facility] OR;  Service: Open Heart Surgery;  Laterality: N/A;   CORONARY/GRAFT ANGIOGRAPHY N/A 12/03/2021   Procedure: CORONARY/GRAFT ANGIOGRAPHY;  Surgeon: Swaziland, [name], MD;  Location: [facility] INVASIVE CV LAB;  Service: Cardiovascular;  Laterality: N/A;   CYSTOSCOPY/URETEROSCOPY/HOLMIUM LASER/STENT PLACEMENT Bilateral 11/07/2016   Procedure: CYSTOSCOPY/URETEROSCOPY/HOLMIUM LASER/STENT PLACEMENT;  Surgeon: Hildred Laser, MD;  Location: [facility] ORS;  Service: Urology;  Laterality: Bilateral;   CYSTOSCOPY/URETEROSCOPY/HOLMIUM LASER/STENT PLACEMENT Bilateral 11/23/2016   Procedure: CYSTOSCOPY/BILATERAL URETEROSCOPY/HOLMIUM LASER/STENT EXCHANGE;  Surgeon: Hildred Laser, MD;  Location: [facility] ORS;  Service: Urology;  Laterality: Bilateral;  NEEDS 90 MIN    EXTERNAL FIXATION LEG Left 02/22/2017   Procedure: EXTERNAL FIXATION LEFT ANKLE;  Surgeon: Sheral Apley, MD;  Location: [facility] OR;  Service: Orthopedics;  Laterality: Left;   HEMORRHOID BANDING  1970s   INGUINAL HERNIA REPAIR Left 1962   KIDNEY STONE SURGERY  1986   "cut me open"   LEFT ATRIAL APPENDAGE OCCLUSION N/A 07/13/2023   Procedure: LEFT ATRIAL APPENDAGE OCCLUSION;  Surgeon: Lanier Prude, MD;  [facility]: [facility] INVAS[facility] CV LAB;  Service: Cardiovascular;  Laterality: N/A;   LEFT HEART CATH AND CORS/GRAFTS ANGIOGRAPHY N/A 10/25/2018   Procedure: LEFT HEART CATH AND  CORS/GRAFTS ANGIOGRAPHY;  Surgeon: Marykay Lex, MD;  Location: [facility] INVASIVE CV LAB;  Service: Cardiovascular;  Laterality: N/A;   LEFT HEART CATH AND CORS/GRAFTS ANGIOGRAPHY N/A 12/03/2021   Procedure: LEFT HEART CATH AND CORS/GRAFTS ANGIOGRAPHY;  Surgeon: Swaziland, [name], MD;  Location: [facility] INVASIVE CV LAB;  Service: Cardiovascular;  Laterality: N/A;   POSTERIOR FUSION CERVICAL SPINE  2006   TEE WITHOUT CARDIOVERSION N/A 03/30/2015   Procedure: TRANSESOPHAGEAL ECHOCARDIOGRAM (TEE);  Surgeon: Delight Ovens, MD;  Location: [facility] OR;  Service: Open Heart Surgery;  Laterality: N/A;   TESTICLE TORSION REDUCTION Right 1960   TOE FUSION Left 1985 & 2004   "  great toe" and removal   TONSILLECTOMY     TOTAL HIP ARTHROPLASTY Right 10/27/2015   Procedure: RIGHT TOTAL HIP ARTHROPLASTY ANTERIOR APPROACH and steroid injection right foot;  Surgeon: Kathryne Hitch, MD;  Location: [facility] OR;  Service: Orthopedics;  Laterality: Right;   TOTAL HIP ARTHROPLASTY Left 08/13/2019   TOTAL HIP ARTHROPLASTY Left 08/13/2019   Procedure: LEFT TOTAL HIP ARTHROPLASTY ANTERIOR APPROACH;  Surgeon: Kathryne Hitch, MD;  Location: [facility] OR;  Service: Orthopedics;  Laterality: Left;   TRANSESOPHAGEAL ECHOCARDIOGRAM (CATH LAB) N/A 07/13/2023   Procedure: TRANSESOPHAGEAL ECHOCARDIOGRAM;  Surgeon: Lanier Prude, MD;  Location: [facility] INVASIVE CV LAB;  Service: Cardiovascular;  Laterality: N/A;    reports that he has been smoking cigarettes. He started smoking about 67 years ago. He has a 34 pack-year smoking history. He has never used smokeless tobacco. He reports current alcohol use of about 3.0 standard drinks of alcohol per week. He reports that he does not use drugs. family history includes Alcohol abuse in an other family member; Arthritis in an other family member; Diabetes in his mother and paternal grandfather; Drug abuse in his sister; Healthy in his brother, daughter, and son; Heart disease in his father, maternal  grandfather, maternal grandmother, mother, and paternal grandfather; Hypertension in his father, maternal grandfather, maternal grandmother, mother, and paternal grandfather; Prostate cancer in his maternal uncle; Tremor in his sister. Allergies  Allergen Reactions   Acyclovir And Related Other (See Comments)    Accumulates and causes stroke-like side-effects due to cytochrome P450 enzyme deficiency   Benzodiazepines Other (See Comments)    Patient states he gets stroke symptoms with benzo's.    Compazine [Prochlorperazine Maleate] Other (See Comments)    Accumulates and causes stroke-like side-effects due to cytochrome P450 enzyme deficiency   Coreg [Carvedilol] Other (See Comments)    Accumulates and causes stroke-like side-effects due to cytochrome P450 enzyme deficiency Patient poor metabolizer of CYP2D6 - Coreg undergoes extensive hepatic (including 2D6)   Flomax [Tamsulosin Hcl] Other (See Comments)    Accumulates and causes stroke-like side-effects due to cytochrome P450 enzyme deficiency   Lisinopril Other (See Comments)    Hyperkalemia. 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   Mobic [Meloxicam] Other (See Comments)    Accumulates and causes stroke-like side-effects due to cytochrome P450 enzyme deficiency   Morphine And Codeine Other (See Comments)    Per pt's wife morphine also contraindicated due to p450 enzyme deficiency.     Ondansetron Other (See Comments)    Accumulates and causes stroke-like side-effects due to cytochrome P450 enzyme deficiency    Oxycodone Other (See Comments)    Accumulates and causes stroke-like side-effects due to cytochrome P450 enzyme deficiency   Promethazine Other (See Comments)    Accumulates and causes stroke-like side-effects due to cytochrome P450 enzyme deficiency   Sertraline Hcl Other (See Comments)    Accumulates and causes stroke-like side-effects due to cytochrome P450 enzyme deficiency    Tramadol Other (See Comments)    Accumulates and causes stroke-like side-effects due to cytochrome P450 enzyme deficiency   Atorvastatin Other (See Comments)    MYALGIAS   Colchicine Other (See Comments)    ataxia   Fentanyl Other (See Comments)    Accumulates and cause stroke like symptoms   Hydrocodone Other (See Comments)    Accumulates and causes stroke-like side-effects due to cytochrome P450 enzyme deficiency   Current Outpatient Medications on File Prior to Visit  Medication Sig Dispense Refill  allopurinol (ZYLOPRIM) 300 MG tablet TAKE 1 TABLET BY MOUTH ONCE DAILY 90 tablet 3   amLODipine (NORVASC) 10 MG tablet TAKE 1 TABLET BY MOUTH EVERY DAY 90 tablet 3   apixaban (ELIQUIS) 5 MG TABS tablet Take 1 tablet (5 mg total) by mouth 2 (two) times daily. 180 tablet 3   Ascorbic Acid (VITAMIN C) 1000 MG tablet Take 1,000 mg by mouth daily.     aspirin EC 81 MG tablet Take 81 mg by mouth every other day. Swallow whole.     Carboxymethylcellulose Sodium (LUBRICANT EYE DROPS OP) Place 1 drop into both eyes 4 (four) times daily as needed (dry/irritated eyes.). Ocean fresh     Cholecalciferol (VITAMIN D-3 PO) Take 2,000 Units by mouth every other day.     Cyanocobalamin (VITAMIN B-12) 5000 MCG TBDP Take 5,000 mcg by mouth every other day.     diltiazem (CARDIZEM CD) 240 MG 24 hr capsule TAKE 1 CAPSULE BY MOUTH DAILY 90 capsule 3   diltiazem (CARDIZEM LA) 240 MG 24 hr tablet 1 tablet Orally Once a day     famotidine (PEPCID) 40 MG tablet Take 1 tablet (40 mg total) by mouth daily. 90 tablet 3   furosemide (LASIX) 40 MG tablet TAKE 1 TABLET BY MOUTH EVERY OTHER DAY 45 tablet 2   isosorbide mononitrate (IMDUR) 60 MG 24 hr tablet Take 1 tablet (60 mg total) by mouth daily. 90 tablet 3   Lancets (ONETOUCH DELICA PLUS LANCET30G) MISC check blood glucose DAILY     levothyroxine (SYNTHROID) 25 MCG tablet TAKE 1 TABLET BY MOUTH EVERY MORNING WITH BREAKFAST 90 tablet 3   nitroGLYCERIN (NITROSTAT)  0.4 MG SL tablet PLACE 1 TABLET UNDER THE TONGUE EVERY 5 MINUTES AS NEEDED FOR CHEST PAIN. 25 tablet 3   NON FORMULARY Pt uses a cpap nightly     pantoprazole (PROTONIX) 40 MG tablet Take 1 tablet (40 mg total) by mouth daily. Does not need refill after 45 days. 45 tablet 0   rosuvastatin (CRESTOR) 10 MG tablet TAKE 1/2 TABLET BY MOUTH EVERY DAY (Patient taking differently: Take 5 mg by mouth daily.) 45 tablet 2   Semaglutide,0.25 or 0.5MG /DOS, (OZEMPIC, 0.25 OR 0.5 MG/DOSE,) 2 MG/3ML SOPN 0.25 mg Subcutaneous once weekly for 30 days     terbinafine (LAMISIL) 250 MG tablet Take 250 mg by mouth once a week.     tiZANidine (ZANAFLEX) 4 MG tablet Take 4 mg by mouth daily as needed for muscle spasms.     Turmeric 500 MG CAPS Take 500 mg by mouth daily.      metFORMIN (GLUCOPHAGE-XR) 500 MG 24 hr tablet Take 1 tablet (500 mg total) by mouth daily with breakfast. (Patient not taking: Reported on 07/06/2023) 90 tablet 3   [DISCONTINUED] ranitidine (ZANTAC) 150 MG tablet TAKE 1 TABLET BY MOUTH 2 TIMES DAILY (Patient taking differently: Take 150 mg by mouth 2 (two) times a day.) 180 tablet 0   No current facility-administered medications on file prior to visit.        ROS:  All others reviewed and negative.  Objective        PE:  BP (!) 166/80 (BP Location: Right Arm, Patient Position: Sitting, Cuff Size: Normal)   Pulse 70   Temp 98.1 F (36.7 C) (Oral)   Ht 5\' 8"  (1.727 m)   Wt 194 lb (88 kg)   SpO2 98%   BMI 29.50 kg/m  Constitutional: Pt appears in NAD               HENT: Head: NCAT.                Right Ear: External ear normal.                 Left Ear: External ear normal.                Eyes: . Pupils are equal, round, and reactive to light. Conjunctivae and EOM are normal               Nose: without d/c or deformity               Neck: Neck supple. Gross normal ROM               Cardiovascular: Normal rate and regular rhythm.                 Pulmonary/Chest: Effort  normal and breath sounds without rales or wheezing.                Abd:  Soft, NT, ND, + BS, no organomegaly               Neurological: Pt is alert. At baseline orientation, motor grossly intact               Skin: Skin is warm. No rashes, no other new lesions, LE edema - none; has marked tender over right greater trochanter               Psychiatric: Pt behavior is normal without agitation   Micro: none  Cardiac tracings I have personally interpreted today:  none  Pertinent Radiological findings (summarize): none   Lab Results  Component Value Date   WBC 7.8 06/27/2023   HGB 14.1 06/27/2023   HCT 42.4 06/27/2023   PLT 210 06/27/2023   GLUCOSE 163 (H) 06/27/2023   CHOL 115 06/28/2023   TRIG 131.0 06/28/2023   HDL 46.10 06/28/2023   LDLDIRECT 77.0 06/28/2022   LDLCALC 43 06/28/2023   ALT 28 06/28/2023   AST 28 06/28/2023   NA 140 06/27/2023   K 3.9 06/27/2023   CL 106 06/27/2023   CREATININE 0.88 06/27/2023   BUN 16 06/27/2023   CO2 21 06/27/2023   TSH 4.45 06/28/2023   PSA 1.95 06/28/2022   INR 1.1 09/15/2022   HGBA1C 7.6 (H) 06/28/2023   MICROALBUR 24.0 (H) 06/28/2023   Assessment/Plan:  Troy Banks is a 83 y.o. White or Caucasian [1] male with  has a past medical history of Allergy, Anxiety, Aortic stenosis, moderate (03/27/2015), Arthritis, Cancer (HCC), Carotid artery stenosis, Cervical stenosis of spinal canal, CHF (congestive heart failure) (HCC), Closed left pilon fracture, COLONIC POLYPS, HX OF, Complication of anesthesia, Coronary artery disease, Depression, Difficult intubation (03/30/2015), DIVERTICULOSIS, COLON, Dyspnea, Dysrhythmia, Familial tremor (03/19/2014), Family history of adverse reaction to anesthesia, GERD (gastroesophageal reflux disease), GOUT, Headache, Heart murmur, High cholesterol, History of blood transfusion, History of kidney stones, HYPERTENSION, Hypothyroidism, Myocardial infarction (HCC), OSA on CPAP, Pneumonia, Poor drug metabolizer  due to cytochrome p450 CYP2D6 variant (HCC), Presence of Watchman left atrial appendage closure device (07/13/2023), PTSD (post-traumatic stress disorder), PTSD (post-traumatic stress disorder), and Sleep apnea.  Bursitis of right hip Mod to severe,for depomedrol IM 80 mg, medrol pack, also for xray,  to f/u any worsening symptoms or concerns    Diet-controlled diabetes mellitus (HCC) Lab Results  Component Value Date   HGBA1C 7.6 (H) 06/28/2023   Mild uncontrolled for age, goal A1c < 7.5, pt to continue current medical treatment ozempic 0.25 mg weekly with intent to titrate,, metformin ER 5000 mg qd   Paroxysmal atrial fibrillation (HCC) Stable overall, volum stable,  to f/u any worsening symptoms or concerns  Followup: Return if symptoms worsen or fail to improve.  Oliver Barre, MD 08/12/2023 9:21 PM [facility]

## 2023-08-10 DIAGNOSIS — M7071 Other bursitis of hip, right hip: Secondary | ICD-10-CM | POA: Diagnosis not present

## 2023-08-10 DIAGNOSIS — Z7985 Long-term (current) use of injectable non-insulin antidiabetic drugs: Secondary | ICD-10-CM | POA: Diagnosis not present

## 2023-08-10 MED ORDER — METHYLPREDNISOLONE ACETATE 80 MG/ML IJ SUSP
80.0000 mg | Freq: Once | INTRAMUSCULAR | Status: AC
  Administered 2023-08-10: 80 mg via INTRAMUSCULAR

## 2023-08-11 ENCOUNTER — Ambulatory Visit: Admitting: Physician Assistant

## 2023-08-11 ENCOUNTER — Encounter: Payer: Self-pay | Admitting: Physician Assistant

## 2023-08-11 DIAGNOSIS — M25551 Pain in right hip: Secondary | ICD-10-CM | POA: Diagnosis not present

## 2023-08-11 MED ORDER — METHYLPREDNISOLONE ACETATE 40 MG/ML IJ SUSP
40.0000 mg | INTRAMUSCULAR | Status: AC | PRN
  Administered 2023-08-11: 40 mg via INTRA_ARTICULAR

## 2023-08-11 MED ORDER — LIDOCAINE HCL 1 % IJ SOLN
5.0000 mL | INTRAMUSCULAR | Status: AC | PRN
  Administered 2023-08-11: 5 mL

## 2023-08-11 NOTE — Progress Notes (Signed)
 Office Visit Note   Patient: Troy Banks           Date of Birth: 17-Jul-1940           MRN: [id number] Visit Date: 08/11/2023              Requested by: Corwin Levins, MD [address] [place],  [place] [address] PCP: Corwin Levins, MD   Assessment & Plan: Visit Diagnoses:  1. Pain in right hip     Plan:: Patient is a pleasant 83 year old gentleman who comes in with his wife today.  He has a history of right total hip arthroplasty with Dr. Magnus Ivan in 2016 he has had no injury but has had a 1 week history of right lateral and posterior hip pain.  He has had a Depo Dosepak as well as a Depo shot.  Because of the pain he has trouble walking.  He has some numbness but he does have baseline neuropathy.  No groin pain pain does not seem to be coming from the hip.  Does have tenderness over the SI joint.  With some degenerative changes on x-ray.  He also has tender in his over the trochanteric bursa and down the IT band would like to engage in physical therapy I will look into possible SI injection for him and we will go forward with a trochanteric bursa injection today I do somebody did  Follow-Up Instructions: Return if symptoms worsen or fail to improve.   Orders:  Orders Placed This Encounter  Procedures   Ambulatory referral to Physical Therapy   No orders of the defined types were placed in this encounter.     Procedures: Large Joint Inj: R greater trochanter on 08/11/2023 11:18 AM Indications: pain and diagnostic evaluation Details: 25 G 1.5 in needle, lateral approach  Arthrogram: No  Medications: 5 mL lidocaine 1 %; 40 mg methylPREDNISolone acetate 40 MG/ML Outcome: tolerated well, no immediate complications Procedure, treatment alternatives, risks and benefits explained, specific risks discussed. Consent was given by the patient.       Clinical Data: No additional findings.   Subjective: No chief complaint on file.   HPI pleasant 83 year old gentleman  comes in today with a 9-day history of posterior buttock and lateral hip pain.  Denies any injury.  Was treated with a Depo Dosepak as well as an injection did not have significant improvement.  Denies any fever or chills.  Pain is making it difficult to walk.  Review of Systems  All other systems reviewed and are negative.    Objective: Vital Signs: There were no vitals taken for this visit.  Physical Exam Constitutional:      Appearance: Normal appearance.  Pulmonary:     Effort: Pulmonary effort is normal.  Skin:    General: Skin is warm and dry.  Neurological:     General: No focal deficit present.     Mental Status: He is alert and oriented to person, place, and time.     Ortho Exam Examination well-healed surgical incision from his hip replacement no redness no fluctuance he has good range of motion with the hip without pain.  With external rotation produces pain over the trochanteric bursa.  He is tender to this area.  Also has some pain posterior in the SI joint no radicular symptoms Specialty Comments:  No specialty comments available.  Imaging: No results found.   PMFS History: Patient Active Problem List   Diagnosis Date Noted  Hyperglycemia due to type 2 diabetes mellitus (HCC) 08/09/2023   Atrial fibrillation (HCC) 07/13/2023   Presence of Watchman left atrial appendage closure device 07/13/2023   Encounter for well adult exam with abnormal findings 06/28/2023   Cough 05/20/2023   Smoker 12/27/2022   Acute pain 12/22/2022   Cervical spondylosis without myelopathy 12/22/2022   Cervicogenic headache 12/22/2022   Medication overuse headache 12/22/2022   Hyperlipidemia 12/22/2022   Idiopathic peripheral neuropathy 12/22/2022   Prediabetes 12/22/2022   Cervicalgia 10/19/2022   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 10/14/2022   Right inguinal hernia 10/05/2022   Age-related nuclear cataract, bilateral 09/23/2022   Cysts of right upper eyelid 09/23/2022   Other  specified disorders of eyelid 09/23/2022   Exposure to potentially hazardous substance 09/23/2022   Atrial flutter (HCC) 09/15/2022   Skin lesion 06/28/2022   Pain of left scapula 04/20/2022   Unstable angina (HCC) 12/02/2021   Intractable chronic migraine without aura and without status migrainosus 11/11/2021   Vitamin D deficiency 06/13/2021   Encounter for fitting and adjustment of hearing aid 11/26/2020   Encounter for fitting and adjustment of spectacles and contact lenses 11/26/2020   DNR (do not resuscitate) discussion 11/26/2020   Aortic atherosclerosis (HCC) 11/26/2020   Allergic rhinitis 10/05/2020   Sensorineural hearing loss, bilateral 09/29/2020   Hallux rigidus 09/23/2020   Retinoschisis 09/23/2020   RUQ pain 06/10/2020   History of lacunar cerebrovascular accident 04/05/2020   Chronic right-sided headache 03/16/2020   Ankle pain 01/22/2020   Hypothyroidism 11/26/2019   Epididymitis 08/28/2019   Unilateral primary osteoarthritis, left hip 08/13/2019   Status post total replacement of left hip 08/13/2019   Abdominal wall hernia 11/02/2018   Progressive angina (HCC) 10/25/2018   DOE (dyspnea on exertion) 10/25/2018   Abscess of right buttock 11/27/2017   Pilon fracture 03/17/2017   Pilon fracture of left tibia 03/17/2017   At risk for adverse drug reaction 02/28/2017   Closed fracture dislocation of ankle joint, left, with delayed healing, subsequent encounter 02/22/2017   Renal stone 10/26/2016   Gross hematuria 10/26/2016   Bladder neck obstruction 10/26/2016   External hemorrhoid, thrombosed 06/10/2016   Internal hemorrhoids 06/10/2016   Lumbar back pain 05/19/2016   Osteoarthritis of right hip 10/27/2015   Chronic chest pain 09/22/2015   Acute gouty arthritis 06/11/2015   Coronary artery disease involving native coronary artery with angina pectoris (HCC) 06/11/2015   Chronic diastolic CHF (congestive heart failure) (HCC) 06/11/2015   H/O tissue AVR Oct 2016  06/10/2015   Paroxysmal atrial fibrillation (HCC) 06/10/2015   Dyslipidemia 06/10/2015   S/P CABG x 4-Oct 2016 03/30/2015   H/O NSTEMI-Oct 2016 03/27/2015   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)    Depression 09/08/2014   GERD (gastroesophageal reflux disease)    Essential tremor 03/31/2014   Musculoskeletal pain 01/01/2014   Spondylolisthesis at L4-L5 level 01/01/2014   Sinus bradycardia 12/31/2013   Greater trochanteric bursitis of right hip 12/04/2013   OSA on CPAP    Diet-controlled diabetes mellitus (HCC) 07/08/2009   History of colonic polyps 12/31/2008   GOUT 12/22/2008   Essential hypertension 12/22/2008   Diverticulosis of colon 12/22/2008   NEPHROLITHIASIS, HX OF 12/22/2008   Past Medical History:  Diagnosis Date   Allergy    Anxiety    PTSD   Aortic stenosis, moderate 03/27/2015   post AVR echo Echo 11/16: Mild LVH, EF 55-60%, normal wall motion, grade 1 diastolic dysfunction, bioprosthetic AVR okay (mean gradient 18  mmHg) with trivial AI, mild LAE, atrial septal aneurysm, small effusion    Arthritis    "qwhere" (10/29/2015)   Cancer (HCC)    skin cancer   Carotid artery stenosis    Carotid US 11/17: 1-39% bilateral ICA stenosis. F/u prn   Cervical stenosis of spinal canal    fusion 2006   CHF (congestive heart failure) (HCC)    "secondry to [facility]"   Closed left pilon fracture    COLONIC POLYPS, HX OF    Complication of anesthesia    "takes a lot to put him under and has woken up during surgery" Difficult to wake up . PTSD; "does not metabolize RX well"that he gets "violent", per pt.    Coronary artery disease    Depression    Difficult intubation 03/30/2015   Glidescop and 4 used 10/28/22, 04/07/23 due to history   DIVERTICULOSIS, COLON    Dyspnea    Dysrhythmia    2 bouts of afib post op and at home but corrected with medication.   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 Lalla Brothers   PTSD (post-traumatic stress disorder)    PTSD (post-traumatic stress disorder)    Sleep apnea    On a CPAP    Family History  Problem Relation Age of Onset   Heart disease Mother    Hypertension Mother    Diabetes Mother    Heart disease Father    Hypertension Father    Tremor Sister    Healthy Brother    Healthy Son    Prostate cancer Maternal Uncle    Hypertension Maternal Grandmother    Heart disease Maternal Grandmother    Hypertension Maternal Grandfather    Heart disease Maternal Grandfather    Hypertension Paternal Grandfather    Heart disease Paternal Grandfather    Diabetes Paternal Grandfather    Alcohol abuse Other    Arthritis Other    Drug abuse Sister    Healthy Daughter    Colon polyps Neg Hx    Esophageal cancer Neg Hx    Rectal cancer Neg Hx    Stomach cancer Neg Hx    Colon cancer Neg Hx     Past Surgical History:  Procedure Laterality Date   ANKLE FUSION Left 03/17/2017   Procedure: FUSION  PILON FRACTURE WITH BONE GRAFTING;  Surgeon: Roby Lofts, MD;  Location: [facility] OR;  Service: Orthopedics;  Laterality: Left;   ANTERIOR FUSION CERVICAL SPINE  2002   AORTIC VALVE REPLACEMENT N/A 03/30/2015   Procedure: AORTIC VALVE REPLACEMENT (AVR);  Surgeon: Delight Ovens, MD;  Location: [facility] OR;  Service: Open  Heart Surgery;  Laterality: N/A;   ATRIAL FIBRILLATION ABLATION N/A 04/07/2023   Procedure: ATRIAL FIBRILLATION ABLATION;  Surgeon: Lanier Prude, MD;  Location: [facility] INVASIVE CV LAB;  Service: Cardiovascular;  Laterality: N/A;   CARDIAC CATHETERIZATION N/A 03/26/2015   Procedure: Left Heart Cath and Coronary Angiography;  Surgeon: Marykay Lex, MD;  Location[facility]VAS[facility] CV LAB;  Service: Cardiovascular;  Laterality: N/A;   CARDIAC VALVE REPLACEMENT     CHOLECYSTECTOMY N/A 10/28/2022   Procedure: LAPAROSCOPIC CHOLECYSTECTOMY WITH INTRAOPERATIVE CHOLANGIOGRAM;  Surgeon: Luretha Murphy, MD;  Location: [facility] ORS;  Service: General;  Laterality: N/A;   COLONOSCOPY W/ POLYPECTOMY     CORONARY ARTERY BYPASS GRAFT N/A 03/30/2015   Procedure: CORONARY ARTERY BYPASS GRAFTING (CABG) x four, using left internal mammary artery and left    leg greater saphenous vein harvested endoscopically ;  Surgeon: Delight Ovens, MD;  Location: [facility] OR;  Service: Open Heart Surgery;  Laterality: N/A;   CORONARY/GRAFT ANGIOGRAPHY N/A 12/03/2021   Procedure: CORONARY/GRAFT ANGIOGRAPHY;  Surgeon: Swaziland, [name], MD;  Location[facility] INVASIVE CV LAB;  Service: Cardiovascular;  Laterality: N/A;   CYSTOSCOPY/URETEROSCOPY/HOLMIUM LASER/STENT PLACEMENT Bilateral 11/07/2016   Procedure: CYSTOSCOPY/URETEROSCOPY/HOLMIUM LASER/STENT PLACEMENT;  Surgeon: Hildred Laser, MD;  Location: [facility] ORS;  Service: Urology;  Laterality: Bilateral;   CYSTOSCOPY/URETEROSCOPY/HOLMIUM LASER/STENT PLACEMENT Bilateral 11/23/2016   Procedure: CYSTOSCOPY/BILATERAL URETEROSCOPY/HOLMIUM LASER/STENT EXCHANGE;  Surgeon: Hildred Laser, MD;  Location: [facility] ORS;  Service: Urology;  Laterality: Bilateral;  NEEDS 90 MIN    EXTERNAL FIXATION LEG Left 02/22/2017   Procedure: EXTERNAL FIXATION LEFT ANKLE;  Surgeon: Sheral Apley, MD;  Location: [facility] OR;  Service: Orthopedics;  Laterality: Left;   HEMORRHOID BANDING  1970s   INGUINAL HERNIA REPAIR Left 1962    KIDNEY STONE SURGERY  1986   "cut me open"   LEFT ATRIAL APPENDAGE OCCLUSION N/A 07/13/2023   Procedure: LEFT ATRIAL APPENDAGE OCCLUSION;  Surgeon: Lanier Prude, MD;  Location: [facility] INVASIVE CV LAB;  Service: Cardiovascular;  Laterality: N/A;   LEFT HEART CATH AND CORS/GRAFTS ANGIOGRAPHY N/A 10/25/2018   Procedure: LEFT HEART CATH AND CORS/GRAFTS ANGIOGRAPHY;  Surgeon: Marykay Lex, MD;  Location: [facility] INVASIVE CV LAB;  Service: Cardiovascular;  Laterality: N/A;   LEFT HEART CATH AND CORS/GRAFTS ANGIOGRAPHY N/A 12/03/2021   Procedure: LEFT HEART CATH AND CORS/GRAFTS ANGIOGRAPHY;  Surgeon: Swaziland, [name], MD;  Location: [facility] INVASIVE CV LAB;  Service: Cardiovascular;  Laterality: N/A;   POSTERIOR FUSION CERVICAL SPINE  2006   TEE WITHOUT CARDIOVERSION N/A 03/30/2015   Procedure: TRANSESOPHAGEAL ECHOCARDIOGRAM (TEE);  Surgeon: Delight Ovens, MD;  Location: [facility] OR;  Service: Open Heart Surgery;  Laterality: N/A;   TESTICLE TORSION REDUCTION Right 1960   TOE FUSION Left 1985 & 2004   "great toe" and removal   TONSILLECTOMY     TOTAL HIP ARTHROPLASTY Right 10/27/2015   Procedure: RIGHT TOTAL HIP ARTHROPLASTY ANTERIOR APPROACH and steroid injection right foot;  Surgeon: Kathryne Hitch, MD;  Location: [facility] OR;  Service: Orthopedics;  Laterality: Right;   TOTAL HIP ARTHROPLASTY Left 08/13/2019   TOTAL HIP ARTHROPLASTY Left 08/13/2019   Procedure: LEFT TOTAL HIP ARTHROPLASTY ANTERIOR APPROACH;  Surgeon: Kathryne Hitch, MD;  Location: [facility] OR;  Service: Orthopedics;  Laterality: Left;   TRANSESOPHAGEAL ECHOCARDIOGRAM (CATH LAB) N/A 07/13/2023   Procedure: TRANSESOPHAGEAL ECHOCARDIOGRAM;  Surgeon: Lanier Prude, MD;  Location: [facility] INVASIVE CV LAB;  Service: Cardiovascular;  Laterality: N/A;   Social History  Occupational History   Occupation: Retired Economist  Tobacco Use   Smoking status: Some Days    Current packs/day: 0.00    Average packs/day: 1 pack/day for  34.0 years (34.0 ttl pk-yrs)    Types: Cigarettes    Start date: 06/06/1956    Last attempt to quit: 06/06/1990    Years since quitting: 33.2   Smokeless tobacco: Never  Vaping Use   Vaping status: Never Used  Substance and Sexual Activity   Alcohol use: Yes    Alcohol/week: 3.0 standard drinks of alcohol    Types: 1 Cans of beer, 2 Shots of liquor per week    Comment: occassional   Drug use: No   Sexual activity: Yes

## 2023-08-12 ENCOUNTER — Encounter: Payer: Self-pay | Admitting: Internal Medicine

## 2023-08-12 DIAGNOSIS — M7071 Other bursitis of hip, right hip: Secondary | ICD-10-CM | POA: Insufficient documentation

## 2023-08-12 NOTE — Assessment & Plan Note (Signed)
 Lab Results  Component Value Date   HGBA1C 7.6 (H) 06/28/2023   Mild uncontrolled for age, goal A1c < 7.5, pt to continue current medical treatment ozempic 0.25 mg weekly with intent to titrate,, metformin ER 5000 mg qd

## 2023-08-12 NOTE — Assessment & Plan Note (Signed)
 Stable overall, volum stable,  to f/u any worsening symptoms or concerns

## 2023-08-12 NOTE — Assessment & Plan Note (Signed)
 Mod to severe,for depomedrol IM 80 mg, medrol pack, also for xray,  to f/u any worsening symptoms or concerns

## 2023-08-14 NOTE — Telephone Encounter (Signed)
 Rescheduled the patient's post-procedure visit to 08/30/2023. cCT scheduled 09/14/2023. The patient's wife agreed with plan.

## 2023-08-14 NOTE — Addendum Note (Signed)
 Addended by: Gunnar Fusi A on: 08/14/2023 04:42 PM   Modules accepted: Orders

## 2023-08-15 DIAGNOSIS — G4733 Obstructive sleep apnea (adult) (pediatric): Secondary | ICD-10-CM | POA: Diagnosis not present

## 2023-08-15 DIAGNOSIS — G43019 Migraine without aura, intractable, without status migrainosus: Secondary | ICD-10-CM | POA: Diagnosis not present

## 2023-08-15 DIAGNOSIS — G43719 Chronic migraine without aura, intractable, without status migrainosus: Secondary | ICD-10-CM | POA: Diagnosis not present

## 2023-08-15 DIAGNOSIS — G4486 Cervicogenic headache: Secondary | ICD-10-CM | POA: Diagnosis not present

## 2023-08-15 DIAGNOSIS — M47812 Spondylosis without myelopathy or radiculopathy, cervical region: Secondary | ICD-10-CM | POA: Diagnosis not present

## 2023-08-15 DIAGNOSIS — M542 Cervicalgia: Secondary | ICD-10-CM | POA: Diagnosis not present

## 2023-08-15 DIAGNOSIS — R52 Pain, unspecified: Secondary | ICD-10-CM | POA: Diagnosis not present

## 2023-08-15 DIAGNOSIS — G4440 Drug-induced headache, not elsewhere classified, not intractable: Secondary | ICD-10-CM | POA: Diagnosis not present

## 2023-08-15 DIAGNOSIS — G43919 Migraine, unspecified, intractable, without status migrainosus: Secondary | ICD-10-CM | POA: Insufficient documentation

## 2023-08-21 ENCOUNTER — Other Ambulatory Visit: Payer: Self-pay | Admitting: Internal Medicine

## 2023-08-22 ENCOUNTER — Ambulatory Visit (HOSPITAL_COMMUNITY): Payer: Medicare Other | Admitting: Physician Assistant

## 2023-08-22 ENCOUNTER — Other Ambulatory Visit: Payer: Self-pay

## 2023-08-28 ENCOUNTER — Encounter: Payer: Self-pay | Admitting: Physical Therapy

## 2023-08-28 ENCOUNTER — Ambulatory Visit: Admitting: Physical Therapy

## 2023-08-28 DIAGNOSIS — M6281 Muscle weakness (generalized): Secondary | ICD-10-CM

## 2023-08-28 DIAGNOSIS — R262 Difficulty in walking, not elsewhere classified: Secondary | ICD-10-CM

## 2023-08-28 DIAGNOSIS — M25551 Pain in right hip: Secondary | ICD-10-CM | POA: Diagnosis not present

## 2023-08-28 DIAGNOSIS — M5459 Other low back pain: Secondary | ICD-10-CM

## 2023-08-28 NOTE — Progress Notes (Unsigned)
 Electrophysiology Office Note:   Date:  08/30/2023  ID:  Troy Banks, DOB 1940/10/29, MRN [id number]  Primary Cardiologist: Peter Swaziland, MD Primary Heart Failure: None Electrophysiologist: Lanier Prude, MD      History of Present Illness:   Troy Banks is a 83 y.o. 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   He is s/p AF / AFL ablation 04/08/23.  Given his CYP Enzyme variant, he is not a long term OAC candidate & underwent LAAO / Watchman implant on 07/13/23.   Since last being seen in our clinic the patient reports doing very well. He states he couldn't get by without his wife helping him. She raises the question of going back on quinidine for tremor when talking about transition to plavix. No issues with groin site healing.   He denies chest pain, palpitations, dyspnea, PND, orthopnea, nausea, vomiting, dizziness, syncope, edema, weight gain, or early satiety.   Review of systems complete and found to be negative unless listed in HPI.   EP Information / Studies Reviewed:    EKG is not ordered today. EKG from 06/27/23 reviewed which showed SR with 1st degree AVB      Studies:  LHC 11/2021 > 3 vessel obstructive CAD, all grafts patent, no significant change since 2020 ECHO LVEF 60-65%, G1DD, s/p 23 mm Edwards porcine AVR with nml function CT Cardiac 03/31/23 > LA normal size, LAA large broccoli type with two lobes and ostial size of 15.8 mm & length 36.5 mm.  RA normal size. No PFO/ASD, normal pulmonary vein drainage, CAC score of 2631 which is 88th percentile for matched controls EPS 04/07/2023 > PVI ablation, ablation of the posterior wall, & CTI     Arrhythmia / AAD AF  AFL     Risk Assessment/Calculations:    CHA2DS2-VASc Score = 8   This indicates a 10.8%  annual risk of stroke. The patient's score is based upon: CHF History: 1 HTN History: 1 Diabetes History: 1 Stroke History: 2 Vascular Disease History: 1 Age Score: 2 Gender Score: 0             Physical Exam:   VS:  BP 121/63   Pulse 67   Ht 5' 8.5" (1.74 m)   Wt 195 lb 3.2 oz (88.5 kg)   SpO2 97%   BMI 29.25 kg/m    Wt Readings from Last 3 Encounters:  08/30/23 195 lb 3.2 oz (88.5 kg)  08/09/23 194 lb (88 kg)  07/13/23 204 lb (92.5 kg)     GEN: Well nourished, well developed in no acute distress NECK: No JVD; No carotid bruits CARDIAC: Regular rate and rhythm, no murmurs, rubs, gallops RESPIRATORY:  Clear to auscultation without rales, wheezing or rhonchi  ABDOMEN: Soft, non-tender, non-distended EXTREMITIES:  No edema; No deformity   ASSESSMENT AND PLAN:    Atrial Fibrillation s/p Watchman  AFL S/p PVI / CT ablation 04/2023  Watchman 27 mm FLX Pro Device / LAAO 07/13/23 -Post implant OAC plan: eliquis 5mg  + ASA 81 mg x 45 days, then Plavix 75mg  daily to complete 6 months, then ASA 81 mg daily  -post procedure CT scan scheduled 09/14/23 with labs  -plan for plavix through 01/11/24 then transition to ASA alone thereafter given baseline CV risk  -follow up in clinic scheduled  for 01/11/24  -SBE prophylaxis given to patient > amoxicillin.  Discussed to use 1 hour before procedure if to have any dental procedures.  -confirmed plavix is metabolized via 2c19 (not the CYP2D6 that he has a deficiency of) & same for amoxicillin (does not use the CYP2D6)   Follow up with EP APP  as planned on 01/11/24    Signed, Canary Brim, NP-C, AGACNP-BC [facility] - Electrophysiology  08/30/2023, 1:38 PM

## 2023-08-28 NOTE — Therapy (Signed)
 OUTPATIENT PHYSICAL THERAPY LOWER EXTREMITY EVALUATION   Patient Name: Troy Banks MRN: [id number] DOB:[birth year], 83 y.o., male Today's Date: 08/28/2023  END OF SESSION:  PT End of Session - 08/28/23 1107     Visit Number 1    Number of Visits 20    Date for PT Re-Evaluation 11/10/23    Authorization Type [employer] Medicare    Progress Note Due on Visit 10    PT Start Time 1103    PT Stop Time 1143    PT Time Calculation (min) 40 min    Activity Tolerance Patient tolerated treatment well    Behavior During Therapy WFL for tasks assessed/performed             Past Medical History:  Diagnosis Date   Allergy    Anxiety    PTSD   Aortic stenosis, moderate 03/27/2015   post AVR echo Echo 11/16: Mild LVH, EF 55-60%, normal wall motion, grade 1 diastolic dysfunction, bioprosthetic AVR okay (mean gradient 18 mmHg) with trivial AI, mild LAE, atrial septal aneurysm, small effusion    Arthritis    "qwhere" (10/29/2015)   Cancer (HCC)    skin cancer   Carotid artery stenosis    Carotid US 11/17: 1-39% bilateral ICA stenosis. F/u prn   Cervical stenosis of spinal canal    fusion 2006   CHF (congestive heart failure) (HCC)    "secondry to [facility]"   Closed left pilon fracture    COLONIC POLYPS, HX OF    Complication of anesthesia    "takes a lot to put him under and has woken up during surgery" Difficult to wake up . PTSD; "does not metabolize RX well"that he gets "violent", per pt.    Coronary artery disease    Depression    Difficult intubation 03/30/2015   Glidescop and 4 used 10/28/22, 04/07/23 due to history   DIVERTICULOSIS, COLON    Dyspnea    Dysrhythmia    2 bouts of afib post op and at home but corrected with medication.   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 Lalla Brothers   PTSD (post-traumatic stress disorder)    PTSD (post-traumatic stress disorder)    Sleep apnea    On a CPAP   Past Surgical History:  Procedure Laterality Date   ANKLE FUSION Left 03/17/2017   Procedure: FUSION  PILON FRACTURE WITH BONE GRAFTING;  Surgeon: Roby Lofts, MD;  Location: [facility] OR;  Service: Orthopedics;  Laterality: Left;   ANTERIOR FUSION CERVICAL SPINE  2002   AORTIC VALVE REPLACEMENT N/A 03/30/2015   Procedure: AORTIC VALVE REPLACEMENT (AVR);  Surgeon: Delight Ovens, MD;  Location: [facility] OR;  Service: Open Heart Surgery;  Laterality: N/A;   ATRIAL FIBRILLATION ABLATION N/A 04/07/2023   Procedure: ATRIAL FIBRILLATION ABLATION;  Surgeon: Lanier Prude, MD;  Location: [facility] INVASIVE CV LAB;  Service: Cardiovascular;  Laterality: N/A;   CARDIAC CATHETERIZATION N/A 03/26/2015   Procedure: Left Heart Cath and Coronary Angiography;  Surgeon: Marykay Lex, MD;  Location: [facility] INVASIVE CV LAB;  Service: Cardiovascular;  Laterality: N/A;   CARDIAC VALVE REPLACEMENT     CHOLECYSTECTOMY N/A 10/28/2022   Procedure: LAPAROSCOPIC CHOLECYSTECTOMY WITH INTRAOPERATIVE CHOLANGIOGRAM;  Surgeon: Luretha Murphy, MD;  Location: [facility] ORS;  Service: General;  Laterality: N/A;   COLONOSCOPY W/ POLYPECTOMY     CORONARY ARTERY BYPASS GRAFT N/A 03/30/2015   Procedure: CORONARY ARTERY BYPASS GRAFTING (CABG) x four, using left internal mammary artery and left    leg greater saphenous vein  harvested endoscopically ;  Surgeon: Delight Ovens, MD;  Location: [facility] OR;  Service: Open Heart Surgery;  Laterality: N/A;   CORONARY/GRAFT ANGIOGRAPHY N/A 12/03/2021   Procedure: CORONARY/GRAFT ANGIOGRAPHY;  Surgeon: Swaziland, [name], MD;  Location: [facility] INVASIVE CV LAB;  Service: Cardiovascular;  Laterality: N/A;   CYSTOSCOPY/URETEROSCOPY/HOLMIUM LASER/STENT PLACEMENT Bilateral 11/07/2016   Procedure: CYSTOSCOPY/URETEROSCOPY/HOLMIUM LASER/STENT PLACEMENT;  Surgeon: Hildred Laser, MD;  Location: [facility] ORS;  Service: Urology;  Laterality: Bilateral;   CYSTOSCOPY/URETEROSCOPY/HOLMIUM LASER/STENT PLACEMENT Bilateral 11/23/2016   Procedure: CYSTOSCOPY/BILATERAL URETEROSCOPY/HOLMIUM LASER/STENT EXCHANGE;  Surgeon: Hildred Laser, MD;  Location: [facility] ORS;  Service: Urology;  Laterality: Bilateral;  NEEDS 90 MIN    EXTERNAL FIXATION LEG Left 02/22/2017   Procedure: EXTERNAL FIXATION LEFT ANKLE;  Surgeon: Sheral Apley, MD;  Location: [facility] OR;  Service: Orthopedics;  Laterality: Left;   HEMORRHOID BANDING  1970s   INGUINAL HERNIA REPAIR Left 1962   KIDNEY STONE SURGERY  1986   "cut me open"   LEFT ATRIAL APPENDAGE OCCLUSION N/A 07/13/2023   Procedure: LEFT ATRIAL APPENDAGE OCCLUSION;  Surgeon: Lanier Prude, MD;  Location: [facility] INVASIVE CV LAB;  Service: Cardiovascular;  Laterality: N/A;   LEFT HEART CATH AND CORS/GRAFTS ANGIOGRAPHY N/A 10/25/2018   Procedure: LEFT HEART CATH AND CORS/GRAFTS ANGIOGRAPHY;  Surgeon: Marykay Lex, MD;  Location: [facility] INVASIVE CV LAB;  Service: Cardiovascular;  Laterality: N/A;   LEFT HEART CATH AND CORS/GRAFTS ANGIOGRAPHY N/A 12/03/2021   Procedure: LEFT HEART CATH AND CORS/GRAFTS ANGIOGRAPHY;  Surgeon: Swaziland, [name], MD;  Location: [facility] INVASIVE CV LAB;  Service: Cardiovascular;  Laterality: N/A;   POSTERIOR FUSION CERVICAL SPINE  2006   TEE WITHOUT CARDIOVERSION N/A 03/30/2015   Procedure: TRANSESOPHAGEAL ECHOCARDIOGRAM (TEE);  Surgeon: Delight Ovens, MD;   Location: [facility] OR;  Service: Open Heart Surgery;  Laterality: N/A;   TESTICLE TORSION REDUCTION Right 1960   TOE FUSION Left 1985 & 2004   "great toe" and removal   TONSILLECTOMY     TOTAL HIP ARTHROPLASTY Right 10/27/2015   Procedure: RIGHT TOTAL HIP ARTHROPLASTY ANTERIOR APPROACH and steroid injection right foot;  Surgeon: Kathryne Hitch, MD;  Location: [facility] OR;  Service: Orthopedics;  Laterality: Right;   TOTAL HIP ARTHROPLASTY Left 08/13/2019   TOTAL HIP ARTHROPLASTY Left 08/13/2019   Procedure: LEFT TOTAL HIP ARTHROPLASTY ANTERIOR APPROACH;  Surgeon: Kathryne Hitch, MD;  Location: [facility] OR;  Service: Orthopedics;  Laterality: Left;   TRANSESOPHAGEAL ECHOCARDIOGRAM (CATH LAB) N/A 07/13/2023   Procedure: TRANSESOPHAGEAL ECHOCARDIOGRAM;  Surgeon: Lanier Prude, MD;  Location: [facility] INVASIVE CV LAB;  Service: Cardiovascular;  Laterality: N/A;   Patient Active Problem List   Diagnosis Date Noted   Bursitis of right hip 08/12/2023   Hyperglycemia due to type 2 diabetes mellitus (HCC) 08/09/2023   Atrial fibrillation (HCC) 07/13/2023   Presence of Watchman left atrial appendage closure device 07/13/2023   Encounter for well adult exam with abnormal findings 06/28/2023   Cough 05/20/2023   Smoker 12/27/2022   Acute pain 12/22/2022   Cervical spondylosis without myelopathy 12/22/2022   Cervicogenic headache 12/22/2022   Medication overuse headache 12/22/2022   Hyperlipidemia 12/22/2022   Idiopathic peripheral neuropathy 12/22/2022   Prediabetes 12/22/2022   Cervicalgia 10/19/2022   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 10/14/2022   Right inguinal hernia 10/05/2022   Age-related nuclear cataract, bilateral 09/23/2022   Cysts of right upper eyelid 09/23/2022   Other specified disorders of eyelid 09/23/2022   Exposure to potentially hazardous substance 09/23/2022   Atrial flutter (HCC) 09/15/2022   Skin lesion 06/28/2022   Pain of left scapula 04/20/2022   Unstable angina (HCC)  12/02/2021   Intractable chronic migraine without aura and without status migrainosus 11/11/2021   Vitamin D deficiency 06/13/2021   Encounter for fitting and adjustment of hearing aid 11/26/2020   Encounter for fitting and adjustment of spectacles and contact lenses 11/26/2020   DNR (do not resuscitate) discussion 11/26/2020   Aortic atherosclerosis (HCC) 11/26/2020   Allergic rhinitis 10/05/2020   Sensorineural hearing loss, bilateral 09/29/2020   Hallux rigidus 09/23/2020   Retinoschisis 09/23/2020   RUQ pain 06/10/2020   History of lacunar cerebrovascular accident 04/05/2020   Chronic right-sided headache 03/16/2020   Ankle pain 01/22/2020   Hypothyroidism 11/26/2019   Epididymitis 08/28/2019   Unilateral primary osteoarthritis, left hip 08/13/2019   Status post total replacement of left hip 08/13/2019   Abdominal wall hernia 11/02/2018   Progressive angina (HCC) 10/25/2018   DOE (dyspnea on exertion) 10/25/2018   Abscess of right buttock 11/27/2017   Pilon fracture 03/17/2017   Pilon fracture of left tibia 03/17/2017   At risk for adverse drug reaction 02/28/2017   Closed fracture dislocation of ankle joint, left, with delayed healing, subsequent encounter 02/22/2017   Renal stone 10/26/2016   Gross hematuria 10/26/2016   Bladder neck obstruction 10/26/2016   External hemorrhoid, thrombosed 06/10/2016   Internal hemorrhoids 06/10/2016   Lumbar back pain 05/19/2016   Osteoarthritis of right hip 10/27/2015   Chronic chest pain 09/22/2015   Acute gouty arthritis 06/11/2015   Coronary artery disease involving native coronary artery with angina pectoris (HCC) 06/11/2015   Chronic diastolic CHF (congestive heart failure) (HCC) 06/11/2015   H/O tissue AVR Oct 2016 06/10/2015   Paroxysmal atrial fibrillation (HCC) 06/10/2015   Dyslipidemia 06/10/2015   S/P CABG x 4-Oct 2016 03/30/2015   H/O NSTEMI-Oct 2016 03/27/2015   Poor drug metabolizer due to cytochrome p450 CYP2D6  variant (HCC)    Depression 09/08/2014   GERD (gastroesophageal reflux disease)    Essential tremor 03/31/2014   Musculoskeletal pain 01/01/2014   Spondylolisthesis at L4-L5 level 01/01/2014   Sinus bradycardia 12/31/2013   Greater trochanteric bursitis of right hip 12/04/2013   OSA on CPAP    Diet-controlled diabetes mellitus (HCC) 07/08/2009   History of colonic polyps 12/31/2008   GOUT 12/22/2008   Essential hypertension 12/22/2008   Diverticulosis of colon 12/22/2008   NEPHROLITHIASIS, HX OF 12/22/2008    PCP: Corwin Levins, MD    REFERRING PROVIDER: Persons, [place], [place]   REFERRING DIAG:  Diagnosis  M25.551 (ICD-10-CM) - Pain in right hip    THERAPY DIAG:  Right hip pain  Muscle weakness (generalized)  Difficulty in walking, not elsewhere classified  Other low back pain  Rationale for Evaluation and Treatment: Rehabilitation  ONSET DATE: 3-4 months progressively getting worse  SUBJECTIVE:    SUBJECTIVE STATEMENT: Pt arriving today reporting worsening Rt and left hip pain over the last 3-4 months, with worsening pain on the Rt.  Patient  has a history of right total hip arthroplasty with Dr. Magnus Ivan in 2016 he has had no injury but has had a 1 week history of right lateral and posterior hip pain. He has had a Depo Dosepak as well as a Depo shot. Because of the pain he has trouble walking. He has some numbness but he does have baseline neuropathy. No groin pain pain does not seem to be coming from the hip. Does have tenderness over the SI joint. With some degenerative changes on x-ray. He also has tender in his over the trochanteric bursa and down the IT band would like to engage in physical therapy   PERTINENT HISTORY: THA on left 08/13/2019, THA on Rt 10/27/2015, A-fib, Cardiac valve replacement, ankle fusion 03/17/2017  PAIN:  NPRS scale: 5/10 Pain location: Rt hip Pain description: achy, throbbing, soreness, stiffness Aggravating factors: standing,  transfers, walking Relieving factors: resting, changing positions helps some but doesn't completely resolve  PRECAUTIONS: none  WEIGHT BEARING RESTRICTIONS: No  FALLS:  Has patient fallen in last 6 months? No  LIVING ENVIRONMENT: Lives with: lives with their family and lives with their spouse Lives in: House/apartment  Has following equipment at home: Single point cane, Environmental consultant - 2 wheeled, and shower chair  OCCUPATION: retired  PLOF: Independent  PATIENT GOALS: walk to my grandchild's graduation.   Next MD visit:   OBJECTIVE:     PATIENT SURVEYS:  Patient-Specific Activity Scoring Scheme  "0" represents "unable to perform." "10" represents "able to perform at prior level. 0 1 2 3 4 5 6 7 8 9  10 (Date and Score)   Activity Eval  08/28/23    1. walking  5    2. dressing  7    3. Preparing a meal 4   4.    5.    Score 16/3= 5.3    Total score = sum of the activity scores/number of activities Minimum detectable change (90%CI) for average score = 2 points Minimum detectable change (90%CI) for single activity score = 3 points  COGNITION: Overall cognitive status: WFL    SENSATION: Number and tingling down Rt LE into     POSTURE:  rounded shoulders, forward head, and decreased lumbar lordosis  PALPATION: TTP: Rt vastus intermedius  LOWER EXTREMITY ROM:   ROM Right Eval Active supine Left Eval Active supine  Hip flexion 95 104  Hip extension    Hip abduction    Hip adduction    Hip internal rotation    Hip external rotation    Knee flexion 125 112  Knee extension -6 -6  Ankle dorsiflexion    Ankle plantarflexion    Ankle inversion    Ankle eversion     (Blank rows = not tested)  LOWER EXTREMITY MMT:  MMT Right eval Left eval  Hip flexion 5 5  Hip extension    Hip abduction 3 3  Hip adduction 3 3  Hip internal rotation 3 3  Hip external rotation 3 3  Knee flexion    Knee extension  Ankle dorsiflexion    Ankle plantarflexion     Ankle inversion    Ankle eversion     (Blank rows = not tested)    FUNCTIONAL TESTS:  08/28/23: 5 time sit to stand: 27.6 seconds c UE support   GAIT: 08/28/23 Distance walked: 30 feet clinic distances level surfaces Assistive device utilized: Single point cane Level of assistance: Modified independence Comments: forward flexed posture, increased bilateral  knee flexion during stance phase, decreased heel to toe strike bilaterally and decreased Dorsiflexion and mobility in left ankle.                                                                                                                                                                         TODAY'S TREATMENT                                                                          DATE:  08/28/23 HEP instruction/performance c cues for techniques, handout provided.  Trial set performed of each for comprehension and symptom assessment.  See below for exercise list    PATIENT EDUCATION:  Education details: HEP, POC Person educated: Patient Education method: Explanation, Demonstration, Verbal cues, and Handouts Education comprehension: verbalized understanding, returned demonstration, and verbal cues required  HOME EXERCISE PROGRAM: Access Code: [email or web address] URL: [email or web address] Date: 08/28/2023 Prepared by: Narda Amber  Exercises - Supine Lower Trunk Rotation  - 2 x daily - 7 x weekly - 4 reps - 20 seconds hold - Supine March  - 2 x daily - 7 x weekly - 20 reps - Hooklying Isometric Clamshell  - 2 x daily - 7 x weekly - 3 sets - 10 reps - Supine Hip Adduction Isometric with Ball  - 2 x daily - 7 x weekly - 2 sets - 10 reps - 5 seconds hold  ASSESSMENT:  CLINICAL IMPRESSION: Patient is a 83 y.o. who comes to clinic with complaints of Rt and left hip pain. Pt also reporting h/o of low back pain which has been ongoing and has worsened due to his abnormal gait pattern due to hip pain. Pt presenting  with mobility, strength and movement coordination deficits that impair their ability to perform usual daily and recreational functional activities without increase difficulty/symptoms at this time.  Patient to benefit from skilled PT services to address impairments and limitations to improve to previous level of function without restriction secondary to condition.   OBJECTIVE IMPAIRMENTS: Abnormal gait, decreased  balance, decreased mobility, difficulty walking, decreased ROM, decreased strength, impaired flexibility, and pain.   ACTIVITY LIMITATIONS: lifting, bending, sitting, standing, squatting, sleeping, stairs, transfers, and dressing  PARTICIPATION LIMITATIONS: meal prep and community activity  PERSONAL FACTORS: 3+ comorbidities: see PMH above  are also affecting patient's functional outcome.   REHAB POTENTIAL: Good  CLINICAL DECISION MAKING: Evolving/moderate complexity  EVALUATION COMPLEXITY: Moderate   GOALS: Goals reviewed with patient? Yes  SHORT TERM GOALS: (target date for Short term goals are 3 weeks 09/18/2023)   1.  Patient will demonstrate independent use of home exercise program to maintain progress from in clinic treatments.  Goal status: New  LONG TERM GOALS: (target dates for all long term goals are 10 weeks  11/10/2023 )   1. Patient will demonstrate/report pain at worst less than or equal to 2/10 to facilitate minimal limitation in daily activity secondary to pain symptoms.  Goal status: New   2. Patient will demonstrate independent use of home exercise program to facilitate ability to maintain/progress functional gains from skilled physical therapy services.  Goal status: New   3. Patient will demonstrate Patient specific functional scale avg > or = 7.3 to indicate reduced disability due to condition.   Goal status: New   4.  Patient will demonstrate bil LE MMT >/= 4/5 throughout to faciltiate usual transfers, stairs, squatting at [facility] for daily life.    Goal status: New   5.  Patient will demonstrate 5 time sit to stand </= 15 seconds c/s UE support.  Goal status: New   6.  Pt will be able to walk >/= 500 feet on community surfaces with LRAD.  Goal status: New      PLAN:  PT FREQUENCY: 1-2x/week  PT DURATION: 10 weeks  PLANNED INTERVENTIONS: Can include 16109- PT Re-evaluation, 97110-Therapeutic exercises, 97530- Therapeutic activity, 97112- Neuromuscular re-education, [phone]- Self Care, 97140- Manual therapy, [phone]- Gait training, [phone]- Orthotic Fit/training, [phone]- Canalith repositioning, [id number]- Aquatic Therapy, [phone]- Electrical stimulation (unattended), [phone]- Electrical stimulation (manual), [id number] Physical performance testing, 97016- Vasopneumatic device, [id number]- Ultrasound, [id number]- Traction (mechanical), [id number]- Ionotophoresis 4mg /ml Dexamethasone, Patient/Family education, Balance training, Stair training, Taping, Dry Needling, Joint mobilization, Joint manipulation, Spinal manipulation, Spinal mobilization, Scar mobilization, Vestibular training, Visual/preceptual remediation/compensation, DME instructions, Cryotherapy, and Moist heat.  All performed as medically necessary.  All included unless contraindicated  PLAN FOR NEXT SESSION: Review HEP knowledge/results. Focus on hip add/abd/IR/ER strengthening exercises and functional mobility.       Sharmon Leyden, PT, MPT 08/28/2023, 2:37 PM   Date of referral: 3/725 Referring provider: Persons, [place], [place]  Referring diagnosis? REFERRING DIAG:  Diagnosis  M25.551 (ICD-10-CM) - Pain in right hip   Treatment diagnosis? (if different than referring diagnosis) M25.551, M62.81, R26.2, M54.59  What was this (referring dx) caused by? Ongoing Issue  Ashby Dawes of Condition: Initial Onset (within last 3 months)   Laterality: Both  Current Functional Measure Score: Other 5.3 : pt specific activity score  Objective measurements identify impairments when they are compared to  normal values, the uninvolved extremity, and prior level of function.  [x]  Yes  []  No  Objective assessment of functional ability: Moderate functional limitations   Briefly describe symptoms: Rt and left hip pain, c Rt being worse  How did symptoms start: over time and worsening over the last 3-4 months. Pt c history of bilateral hip replacements.   Average pain intensity:  Last 24 hours: 8/10 mostly Rt hip  Past week: can reach 8/10 at times, 4/10  at the lowest during the last week .   How often does the pt experience symptoms? Constantly  How much have the symptoms interfered with usual daily activities? Quite a bit  How has condition changed since care began at this facility? NA - initial visit  In general, how is the patients overall health? Fair   BACK PAIN (STarT Back Screening Tool) Has pain spread down the leg(s) at some time in the last 2 weeks? yes Has there been pain in the shoulder or neck at some time in the last 2 weeks? Yes, Rt shoulder Has the pt only walked short distances because of back pain? yes Has patient dressed more slowly because of back pain in the past 2 weeks? no Does patient think it's not safe for a person with this condition to be physically active? no Does patient have worrying thoughts a lot of the time? no Does patient feel back pain is terrible and will never get any better? no Has patient stopped enjoying things they usually enjoy? yes

## 2023-08-29 ENCOUNTER — Encounter: Payer: Self-pay | Admitting: Internal Medicine

## 2023-08-30 ENCOUNTER — Ambulatory Visit: Attending: Pulmonary Disease | Admitting: Pulmonary Disease

## 2023-08-30 ENCOUNTER — Encounter: Payer: Self-pay | Admitting: Pulmonary Disease

## 2023-08-30 VITALS — BP 121/63 | HR 67 | Ht 68.5 in | Wt 195.2 lb

## 2023-08-30 DIAGNOSIS — Z79899 Other long term (current) drug therapy: Secondary | ICD-10-CM

## 2023-08-30 DIAGNOSIS — I10 Essential (primary) hypertension: Secondary | ICD-10-CM | POA: Diagnosis not present

## 2023-08-30 DIAGNOSIS — R001 Bradycardia, unspecified: Secondary | ICD-10-CM | POA: Diagnosis not present

## 2023-08-30 DIAGNOSIS — I480 Paroxysmal atrial fibrillation: Secondary | ICD-10-CM

## 2023-08-30 DIAGNOSIS — I5032 Chronic diastolic (congestive) heart failure: Secondary | ICD-10-CM

## 2023-08-30 LAB — BASIC METABOLIC PANEL
BUN/Creatinine Ratio: 14 (ref 10–24)
BUN: 13 mg/dL (ref 8–27)
CO2: 24 mmol/L (ref 20–29)
Calcium: 9.5 mg/dL (ref 8.6–10.2)
Chloride: 101 mmol/L (ref 96–106)
Creatinine, Ser: 0.94 mg/dL (ref 0.76–1.27)
Glucose: 157 mg/dL — ABNORMAL HIGH (ref 70–99)
Potassium: 4.4 mmol/L (ref 3.5–5.2)
Sodium: 140 mmol/L (ref 134–144)
eGFR: 82 mL/min/{1.73_m2} (ref >59)

## 2023-08-30 MED ORDER — AMOXICILLIN 500 MG PO TABS: ORAL_TABLET | ORAL | 1 refills | Status: AC

## 2023-08-30 MED ORDER — CLOPIDOGREL BISULFATE 75 MG PO TABS: 75.0000 mg | ORAL_TABLET | Freq: Every day | ORAL | 6 refills | Status: DC

## 2023-08-30 NOTE — Patient Instructions (Addendum)
 Medication Instructions:  Your physician has recommended you make the following change in your medication:   ** Stop Aspirin  ** Stop Eliquis  ** Begin Plavix 75mg  - 1 tablet by mouth daily.  ** Amoxicillin 500mg  - 4 tablets (2000mg ) by mouth - 1 hour prior to dental procedure.     *If you need a refill on your cardiac medications before your next appointment, please call your pharmacy*   Lab Work: BMET If you have labs (blood work) drawn today and your tests are completely normal, you will receive your results only by: MyChart Message (if you have MyChart) OR A paper copy in the mail If you have any lab test that is abnormal or we need to change your treatment, we will call you to review the results.   Testing/Procedures: None ordered.    Follow-Up: At [facility], you and your health needs are our priority.  As part of our continuing mission to provide you with exceptional heart care, we have created designated Provider Care Teams.  These Care Teams include your primary Cardiologist (physician) and Advanced Practice Providers (APPs -  Physician Assistants and Nurse Practitioners) who all work together to provide you with the care you need, when you need it.  We recommend signing up for the patient portal called "MyChart".  Sign up information is provided on this After Visit Summary.  MyChart is used to connect with patients for Virtual Visits (Telemedicine).  Patients are able to view lab/test results, encounter notes, upcoming appointments, etc.  Non-urgent messages can be sent to your provider as well.   To learn more about what you can do with MyChart, go to [email or web address].    Your next appointment:   Pt needs to be scheduled with Gaye Alken on August 6,7 or 8 2025.

## 2023-09-04 DIAGNOSIS — M47812 Spondylosis without myelopathy or radiculopathy, cervical region: Secondary | ICD-10-CM | POA: Diagnosis not present

## 2023-09-04 DIAGNOSIS — G4486 Cervicogenic headache: Secondary | ICD-10-CM | POA: Diagnosis not present

## 2023-09-12 ENCOUNTER — Telehealth: Payer: Self-pay | Admitting: Cardiology

## 2023-09-12 ENCOUNTER — Encounter: Payer: Self-pay | Admitting: Physical Therapy

## 2023-09-12 ENCOUNTER — Ambulatory Visit: Admitting: Physical Therapy

## 2023-09-12 DIAGNOSIS — Z01812 Encounter for preprocedural laboratory examination: Secondary | ICD-10-CM

## 2023-09-12 DIAGNOSIS — M6281 Muscle weakness (generalized): Secondary | ICD-10-CM | POA: Diagnosis not present

## 2023-09-12 DIAGNOSIS — M25551 Pain in right hip: Secondary | ICD-10-CM | POA: Diagnosis not present

## 2023-09-12 DIAGNOSIS — R262 Difficulty in walking, not elsewhere classified: Secondary | ICD-10-CM

## 2023-09-12 DIAGNOSIS — M47812 Spondylosis without myelopathy or radiculopathy, cervical region: Secondary | ICD-10-CM | POA: Diagnosis not present

## 2023-09-12 DIAGNOSIS — M5459 Other low back pain: Secondary | ICD-10-CM | POA: Diagnosis not present

## 2023-09-12 NOTE — Telephone Encounter (Signed)
 Wife Dois Davenport) called to follow-up on orders for patient to have lab work done prior to upcoming CT Cardiac Morph test.

## 2023-09-12 NOTE — Telephone Encounter (Signed)
 Attempted to call patient, no answer left message requesting a call back.   Per chart review patient completed BMP labs 3/26, no additional labs needed prior to 4/10 CT

## 2023-09-12 NOTE — Therapy (Signed)
 OUTPATIENT PHYSICAL THERAPY LOWER EXTREMITY   Patient Name: Troy Banks MRN: [id number] DOB:1940/06/25, 83 y.o., male Today's Date: 09/12/2023  END OF SESSION:  PT End of Session - 09/12/23 1000     Visit Number 2    Number of Visits 20    Date for PT Re-Evaluation 11/10/23    Authorization Type [employer] Medicare    Progress Note Due on Visit 10    PT Start Time 0934    PT Stop Time 1015    PT Time Calculation (min) 41 min    Activity Tolerance Patient tolerated treatment well    Behavior During Therapy [facility] for tasks assessed/performed              Past Medical History:  Diagnosis Date   Allergy    Anxiety    PTSD   Aortic stenosis, moderate 03/27/2015   post AVR echo Echo 11/16: Mild LVH, EF 55-60%, normal wall motion, grade 1 diastolic dysfunction, bioprosthetic AVR okay (mean gradient 18 mmHg) with trivial AI, mild LAE, atrial septal aneurysm, small effusion    Arthritis    "qwhere" (10/29/2015)   Cancer (HCC)    skin cancer   Carotid artery stenosis    Carotid US 11/17: 1-39% bilateral ICA stenosis. F/u prn   Cervical stenosis of spinal canal    fusion 2006   CHF (congestive heart failure) (HCC)    "secondry to [facility]"   Closed left pilon fracture    COLONIC POLYPS, HX OF    Complication of anesthesia    "takes a lot to put him under and has woken up during surgery" Difficult to wake up . PTSD; "does not metabolize RX well"that he gets "violent", per pt.    Coronary artery disease    Depression    Difficult intubation 03/30/2015   Glidescop and 4 used 10/28/22, 04/07/23 due to history   DIVERTICULOSIS, COLON    Dyspnea    Dysrhythmia    2 bouts of afib post op and at home but corrected with medication.   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 Lalla Brothers   PTSD (post-traumatic stress disorder)    PTSD (post-traumatic stress disorder)    Sleep apnea    On a CPAP   Past Surgical History:  Procedure Laterality Date   ANKLE FUSION Left 03/17/2017   Procedure: FUSION  PILON FRACTURE WITH BONE GRAFTING;  Surgeon: Roby Lofts, MD;  Location: [facility] OR;  Service: Orthopedics;  Laterality: Left;   ANTERIOR FUSION CERVICAL SPINE  2002   AORTIC VALVE REPLACEMENT N/A 03/30/2015   Procedure: AORTIC VALVE REPLACEMENT (AVR);  Surgeon: Delight Ovens, MD;  Location: [facility] OR;  Service: Open Heart Surgery;  Laterality: N/A;   ATRIAL FIBRILLATION ABLATION N/A 04/07/2023   Procedure: ATRIAL FIBRILLATION ABLATION;  Surgeon: Lanier Prude, MD;  Location: [facility] INVASIVE CV LAB;  Service: Cardiovascular;  Laterality: N/A;   CARDIAC CATHETERIZATION N/A 03/26/2015   Procedure: Left Heart Cath and Coronary Angiography;  Surgeon: Marykay Lex, MD;  Location: [facility] INVASIVE CV LAB;  Service: Cardiovascular;  Laterality: N/A;   CARDIAC VALVE REPLACEMENT     CHOLECYSTECTOMY N/A 10/28/2022   Procedure: LAPAROSCOPIC CHOLECYSTECTOMY WITH INTRAOPERATIVE CHOLANGIOGRAM;  Surgeon: Luretha Murphy, MD;  Location: [facility] ORS;  Service: General;  Laterality: N/A;   COLONOSCOPY W/ POLYPECTOMY     CORONARY ARTERY BYPASS GRAFT N/A 03/30/2015   Procedure: CORONARY ARTERY BYPASS GRAFTING (CABG) x four, using left internal mammary artery and left    leg greater saphenous vein harvested  endoscopically ;  Surgeon: Delight Ovens, MD;  Location: [facility] OR;  Service: Open Heart Surgery;  Laterality: N/A;   CORONARY/GRAFT ANGIOGRAPHY N/A 12/03/2021   Procedure: CORONARY/GRAFT ANGIOGRAPHY;  Surgeon: Swaziland, [name], MD;  Location: [facility] INVASIVE CV LAB;  Service: Cardiovascular;  Laterality: N/A;   CYSTOSCOPY/URETEROSCOPY/HOLMIUM LASER/STENT PLACEMENT Bilateral 11/07/2016   Procedure: CYSTOSCOPY/URETEROSCOPY/HOLMIUM LASER/STENT PLACEMENT;  Surgeon: Hildred Laser, MD;  Location: [facility] ORS;  Service: Urology;  Laterality: Bilateral;   CYSTOSCOPY/URETEROSCOPY/HOLMIUM LASER/STENT PLACEMENT Bilateral 11/23/2016   Procedure: CYSTOSCOPY/BILATERAL URETEROSCOPY/HOLMIUM LASER/STENT EXCHANGE;  Surgeon: Hildred Laser, MD;  Location: [facility] ORS;  Service: Urology;  Laterality: Bilateral;  NEEDS 90 MIN    EXTERNAL FIXATION LEG Left 02/22/2017   Procedure: EXTERNAL FIXATION LEFT ANKLE;  Surgeon: Sheral Apley, MD;  Location: [facility] OR;  Service: Orthopedics;  Laterality: Left;   HEMORRHOID BANDING  1970s   INGUINAL HERNIA REPAIR Left 1962   KIDNEY STONE SURGERY  1986   "cut me open"   LEFT ATRIAL APPENDAGE OCCLUSION N/A 07/13/2023   Procedure: LEFT ATRIAL APPENDAGE OCCLUSION;  Surgeon: Lanier Prude, MD;  Location: [facility] INVASIVE CV LAB;  Service: Cardiovascular;  Laterality: N/A;   LEFT HEART CATH AND CORS/GRAFTS ANGIOGRAPHY N/A 10/25/2018   Procedure: LEFT HEART CATH AND CORS/GRAFTS ANGIOGRAPHY;  Surgeon: Marykay Lex, MD;  Location: [facility] INVASIVE CV LAB;  Service: Cardiovascular;  Laterality: N/A;   LEFT HEART CATH AND CORS/GRAFTS ANGIOGRAPHY N/A 12/03/2021   Procedure: LEFT HEART CATH AND CORS/GRAFTS ANGIOGRAPHY;  Surgeon: Swaziland, [name], MD;  Location: [facility] INVASIVE CV LAB;  Service: Cardiovascular;  Laterality: N/A;   POSTERIOR FUSION CERVICAL SPINE  2006   TEE WITHOUT CARDIOVERSION N/A 03/30/2015   Procedure: TRANSESOPHAGEAL ECHOCARDIOGRAM (TEE);  Surgeon: Delight Ovens, MD;  Location: [facility]  OR;  Service: Open Heart Surgery;  Laterality: N/A;   TESTICLE TORSION REDUCTION Right 1960   TOE FUSION Left 1985 & 2004   "great toe" and removal   TONSILLECTOMY     TOTAL HIP ARTHROPLASTY Right 10/27/2015   Procedure: RIGHT TOTAL HIP ARTHROPLASTY ANTERIOR APPROACH and steroid injection right foot;  Surgeon: Kathryne Hitch, MD;  Location: [facility] OR;  Service: Orthopedics;  Laterality: Right;   TOTAL HIP ARTHROPLASTY Left 08/13/2019   TOTAL HIP ARTHROPLASTY Left 08/13/2019   Procedure: LEFT TOTAL HIP ARTHROPLASTY ANTERIOR APPROACH;  Surgeon: Kathryne Hitch, MD;  Location: [facility] OR;  Service: Orthopedics;  Laterality: Left;   TRANSESOPHAGEAL ECHOCARDIOGRAM (CATH LAB) N/A 07/13/2023   Procedure: TRANSESOPHAGEAL ECHOCARDIOGRAM;  Surgeon: Lanier Prude, MD;  Location: [facility] INVASIVE CV LAB;  Service: Cardiovascular;  Laterality: N/A;   Patient Active Problem List   Diagnosis Date Noted   Bursitis of right hip 08/12/2023   Hyperglycemia due to type 2 diabetes mellitus (HCC) 08/09/2023   Atrial fibrillation (HCC) 07/13/2023   Presence of Watchman left atrial appendage closure device 07/13/2023   Encounter for well adult exam with abnormal findings 06/28/2023   Cough 05/20/2023   Smoker 12/27/2022   Acute pain 12/22/2022   Cervical spondylosis without myelopathy 12/22/2022   Cervicogenic headache 12/22/2022   Medication overuse headache 12/22/2022   Hyperlipidemia 12/22/2022   Idiopathic peripheral neuropathy 12/22/2022   Prediabetes 12/22/2022   Cervicalgia 10/19/2022   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 10/14/2022   Right inguinal hernia 10/05/2022   Age-related nuclear cataract, bilateral 09/23/2022   Cysts of right upper eyelid 09/23/2022   Other specified disorders of eyelid 09/23/2022   Exposure to potentially hazardous substance 09/23/2022   Atrial flutter (HCC) 09/15/2022   Skin lesion 06/28/2022   Pain of left scapula 04/20/2022   Unstable angina (HCC) 12/02/2021    Intractable chronic migraine without aura and without status migrainosus 11/11/2021   Vitamin D deficiency 06/13/2021   Encounter for fitting and adjustment of hearing aid 11/26/2020   Encounter for fitting and adjustment of spectacles and contact lenses 11/26/2020   DNR (do not resuscitate) discussion 11/26/2020   Aortic atherosclerosis (HCC) 11/26/2020   Allergic rhinitis 10/05/2020   Sensorineural hearing loss, bilateral 09/29/2020   Hallux rigidus 09/23/2020   Retinoschisis 09/23/2020   RUQ pain 06/10/2020   History of lacunar cerebrovascular accident 04/05/2020   Chronic right-sided headache 03/16/2020   Ankle pain 01/22/2020   Hypothyroidism 11/26/2019   Epididymitis 08/28/2019   Unilateral primary osteoarthritis, left hip 08/13/2019   Status post total replacement of left hip 08/13/2019   Abdominal wall hernia 11/02/2018   Progressive angina (HCC) 10/25/2018   DOE (dyspnea on exertion) 10/25/2018   Abscess of right buttock 11/27/2017   Pilon fracture 03/17/2017   Pilon fracture of left tibia 03/17/2017   At risk for adverse drug reaction 02/28/2017   Closed fracture dislocation of ankle joint, left, with delayed healing, subsequent encounter 02/22/2017   Renal stone 10/26/2016   Gross hematuria 10/26/2016   Bladder neck obstruction 10/26/2016   External hemorrhoid, thrombosed 06/10/2016   Internal hemorrhoids 06/10/2016   Lumbar back pain 05/19/2016   Osteoarthritis of right hip 10/27/2015   Chronic chest pain 09/22/2015   Acute gouty arthritis 06/11/2015   Coronary artery disease involving native coronary artery with angina pectoris (HCC) 06/11/2015   Chronic diastolic CHF (congestive heart failure) (HCC) 06/11/2015   H/O tissue AVR Oct 2016 06/10/2015   Paroxysmal atrial fibrillation (HCC) 06/10/2015   Dyslipidemia 06/10/2015   S/P CABG x 4-Oct 2016 03/30/2015   H/O NSTEMI-Oct 2016 03/27/2015   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)     Depression 09/08/2014   GERD (gastroesophageal reflux disease)    Essential tremor 03/31/2014   Musculoskeletal pain 01/01/2014   Spondylolisthesis at L4-L5 level 01/01/2014   Sinus bradycardia 12/31/2013   Greater trochanteric bursitis of right hip 12/04/2013   OSA on CPAP    Diet-controlled diabetes mellitus (HCC) 07/08/2009   History of colonic polyps 12/31/2008   GOUT 12/22/2008   Essential hypertension 12/22/2008   Diverticulosis of colon 12/22/2008   NEPHROLITHIASIS, HX OF 12/22/2008    PCP: Corwin Levins, MD    REFERRING PROVIDER: Persons, [place], [place]   REFERRING DIAG:  Diagnosis  M25.551 (ICD-10-CM) - Pain in right hip    THERAPY DIAG:  Right hip pain  Muscle weakness (generalized)  Difficulty in walking, not elsewhere classified  Other low back pain  Rationale for Evaluation and Treatment: Rehabilitation  ONSET DATE: 3-4 months progressively getting worse  SUBJECTIVE:    SUBJECTIVE STATEMENT Pt arriving to therapy today reporting 4-5/10 pain in his low back more on the right side.  Pt's wife stating he hasn't been doing his HEP like he should.    Eval:  Pt arriving today reporting worsening Rt and left hip pain over the last 3-4 months, with worsening pain on the Rt.  Patient  has a history of right total hip arthroplasty with Dr. Magnus Ivan in 2016 he has had no injury but has had a 1 week history of right lateral and posterior hip pain. He has had a Depo Dosepak as well as a Depo shot. Because of the pain he has trouble walking. He has some numbness but he does have baseline neuropathy. No groin pain pain does not seem to be coming from the hip. Does have tenderness over the SI joint. With some degenerative changes on x-ray. He also has tender in his over the trochanteric bursa and down the IT band would like to engage in physical therapy   PERTINENT HISTORY: THA on left 08/13/2019, THA on Rt 10/27/2015, A-fib, Cardiac valve replacement, ankle fusion  03/17/2017  PAIN:  NPRS scale: 5/10 Pain location: Rt hip Pain description: achy, throbbing, soreness, stiffness Aggravating factors: standing, transfers, walking Relieving factors: resting, changing positions helps some but doesn't completely resolve  PRECAUTIONS: none  WEIGHT BEARING RESTRICTIONS: No  FALLS:  Has patient fallen in last 6 months? No  LIVING ENVIRONMENT: Lives with: lives with their family and lives with their spouse Lives in: House/apartment  Has following equipment at home: Single point cane, Environmental consultant - 2 wheeled, and shower chair  OCCUPATION: retired  PLOF: Independent  PATIENT GOALS: walk to my grandchild's graduation.   Next MD visit:   OBJECTIVE:     PATIENT SURVEYS:  Patient-Specific Activity Scoring Scheme  "0" represents "unable to perform." "10" represents "able to perform at prior level. 0 1 2 3 4 5 6 7 8 9  10 (Date and Score)   Activity Eval  08/28/23    1. walking  5    2. dressing  7    3. Preparing a meal 4   4.    5.    Score 16/3= 5.3    Total score = sum of the activity scores/number of activities Minimum detectable change (90%CI) for average score = 2 points Minimum detectable change (90%CI) for single activity score = 3 points  COGNITION: Overall cognitive status: WFL    SENSATION: Number and tingling down Rt LE into     POSTURE:  rounded shoulders, forward head, and decreased lumbar lordosis  PALPATION: TTP: Rt vastus intermedius  LOWER EXTREMITY ROM:   ROM Right Eval Active supine Left Eval Active supine  Hip flexion 95 104  Hip extension    Hip abduction    Hip adduction    Hip internal rotation    Hip external rotation    Knee flexion 125 112  Knee extension -6 -6  Ankle dorsiflexion    Ankle plantarflexion    Ankle inversion    Ankle eversion     (Blank rows = not tested)  LOWER EXTREMITY MMT:  MMT Right eval Left eval  Hip flexion 5 5  Hip extension    Hip abduction 3 3  Hip  adduction 3 3  Hip internal rotation 3 3  Hip external rotation 3 3  Knee flexion    Knee extension    Ankle dorsiflexion    Ankle plantarflexion    Ankle inversion    Ankle eversion     (Blank rows = not tested)    FUNCTIONAL TESTS:  08/28/23: 5 time sit to stand: 27.6 seconds c UE support   GAIT: 08/28/23 Distance walked: 30 feet clinic distances level surfaces Assistive device utilized: Single point cane Level of assistance: Modified independence Comments: forward flexed posture, increased bilateral  knee flexion during stance phase, decreased heel to toe strike bilaterally and decreased Dorsiflexion and mobility in left ankle.                                                                                                                                                                         TODAY'S TREATMENT                                                                          DATE: 09/12/23:  Therex:  Nustep: 8 minutes, level 5 UE/LE Standing hip extension x 15 bil c UE support Standing hip abduction x 15 bil c UE support Standing trunk extension elbows on the wall  x 5 holding 10 sec Seated hamstring stretch x 2 bil holding 30 sec Supine trunk rotation x 4 bil holding 30 sec Bridges: x 10 holding 5 sec Bridges with ball squeeze x 10 holding 5 sec TherActivites:  Mini squats c UE support x 10  Sit to stand: x 10 c no UE support Mat mobility: log rolling for spinal alignment and to decrease pt's pain    08/28/23 HEP instruction/performance c cues for techniques, handout provided.  Trial set performed of each for comprehension and symptom assessment.  See below for exercise list    PATIENT EDUCATION:  Education details: HEP, POC Person educated: Patient Education method: Explanation, Demonstration, Verbal cues, and Handouts Education comprehension: verbalized understanding, returned demonstration, and verbal cues required  HOME EXERCISE PROGRAM: Access Code:  [email or web address] URL: [email or web address] Date: 08/28/2023 Prepared by: Narda Amber  Exercises - Supine Lower Trunk Rotation  - 2 x daily - 7 x weekly - 4 reps - 20 seconds hold - Supine March  - 2 x daily - 7 x weekly - 20 reps - Hooklying Isometric Clamshell  - 2  x daily - 7 x weekly - 3 sets - 10 reps - Supine Hip Adduction Isometric with Ball  - 2 x daily - 7 x weekly - 2 sets - 10 reps - 5 seconds hold  ASSESSMENT:  CLINICAL IMPRESSION: Pt tolerating exercises well today. Pt allowed rest breaks as needed throughout session. Pt's HEP was reviewed and pt able to demonstrate compliance. Pt encouraged to work on his HEP even if it's just once daily for improved function. Recommend continued skilled PT interventions.   OBJECTIVE IMPAIRMENTS: Abnormal gait, decreased balance, decreased mobility, difficulty walking, decreased ROM, decreased strength, impaired flexibility, and pain.   ACTIVITY LIMITATIONS: lifting, bending, sitting, standing, squatting, sleeping, stairs, transfers, and dressing  PARTICIPATION LIMITATIONS: meal prep and community activity  PERSONAL FACTORS: 3+ comorbidities: see PMH above  are also affecting patient's functional outcome.   REHAB POTENTIAL: Good  CLINICAL DECISION MAKING: Evolving/moderate complexity  EVALUATION COMPLEXITY: Moderate   GOALS: Goals reviewed with patient? Yes  SHORT TERM GOALS: (target date for Short term goals are 3 weeks 09/18/2023)   1.  Patient will demonstrate independent use of home exercise program to maintain progress from in clinic treatments.  Goal status: On-going 09/12/23  LONG TERM GOALS: (target dates for all long term goals are 10 weeks  11/10/2023 )   1. Patient will demonstrate/report pain at worst less than or equal to 2/10 to facilitate minimal limitation in daily activity secondary to pain symptoms.  Goal status: New   2. Patient will demonstrate independent use of home exercise program to facilitate  ability to maintain/progress functional gains from skilled physical therapy services.  Goal status: New   3. Patient will demonstrate Patient specific functional scale avg > or = 7.3 to indicate reduced disability due to condition.   Goal status: New   4.  Patient will demonstrate bil LE MMT >/= 4/5 throughout to faciltiate usual transfers, stairs, squatting at [facility] for daily life.   Goal status: New   5.  Patient will demonstrate 5 time sit to stand </= 15 seconds c/s UE support.  Goal status: New   6.  Pt will be able to walk >/= 500 feet on community surfaces with LRAD.  Goal status: New      PLAN:  PT FREQUENCY: 1-2x/week  PT DURATION: 10 weeks  PLANNED INTERVENTIONS: Can include 16109- PT Re-evaluation, 97110-Therapeutic exercises, 97530- Therapeutic activity, 97112- Neuromuscular re-education, 97535- Self Care, 97140- Manual therapy, [phone]- Gait training, [phone]- Orthotic Fit/training, [phone]- Canalith repositioning, [id number]- Aquatic Therapy, [phone]- Electrical stimulation (unattended), [phone]- Electrical stimulation (manual), [id number] Physical performance testing, 97016- Vasopneumatic device, [id number]- Ultrasound, [id number]- Traction (mechanical), [id number]- Ionotophoresis 4mg /ml Dexamethasone, Patient/Family education, Balance training, Stair training, Taping, Dry Needling, Joint mobilization, Joint manipulation, Spinal manipulation, Spinal mobilization, Scar mobilization, Vestibular training, Visual/preceptual remediation/compensation, DME instructions, Cryotherapy, and Moist heat.  All performed as medically necessary.  All included unless contraindicated  PLAN FOR NEXT SESSION: Review HEP knowledge/results. Focus on hip add/abd/IR/ER strengthening exercises and functional mobility.     Sharmon Leyden, PT, MPT 09/12/2023, 10:23 AM   Date of referral: 3/725 Referring provider: Persons, [place], [place]  Referring diagnosis? REFERRING DIAG:  Diagnosis  M25.551 (ICD-10-CM) - Pain in right  hip   Treatment diagnosis? (if different than referring diagnosis) M25.551, M62.81, R26.2, M54.59  What was this (referring dx) caused by? Ongoing Issue  Ashby Dawes of Condition: Initial Onset (within last 3 months)   Laterality: Both  Current Functional Measure Score: Other 5.3 : pt specific activity  score  Objective measurements identify impairments when they are compared to normal values, the uninvolved extremity, and prior level of function.  [x]  Yes  []  No  Objective assessment of functional ability: Moderate functional limitations   Briefly describe symptoms: Rt and left hip pain, c Rt being worse  How did symptoms start: over time and worsening over the last 3-4 months. Pt c history of bilateral hip replacements.   Average pain intensity:  Last 24 hours: 8/10 mostly Rt hip  Past week: can reach 8/10 at times, 4/10 at the lowest during the last week .   How often does the pt experience symptoms? Constantly  How much have the symptoms interfered with usual daily activities? Quite a bit  How has condition changed since care began at this facility? NA - initial visit  In general, how is the patients overall health? Fair   BACK PAIN (STarT Back Screening Tool) Has pain spread down the leg(s) at some time in the last 2 weeks? yes Has there been pain in the shoulder or neck at some time in the last 2 weeks? Yes, Rt shoulder Has the pt only walked short distances because of back pain? yes Has patient dressed more slowly because of back pain in the past 2 weeks? no Does patient think it's not safe for a person with this condition to be physically active? no Does patient have worrying thoughts a lot of the time? no Does patient feel back pain is terrible and will never get any better? no Has patient stopped enjoying things they usually enjoy? yes

## 2023-09-13 ENCOUNTER — Telehealth: Payer: Self-pay

## 2023-09-13 NOTE — Telephone Encounter (Signed)
 The patient's wife requested to cancel post-Watchman CT tomorrow as "something came up." She understood the CT schedule is not yet released and they will be called to reschedule once it is published. She was grateful for assistance.  4/10 CT cancelled.

## 2023-09-13 NOTE — Telephone Encounter (Signed)
 2nd attempt to call patient, no answer left message requesting a call back.

## 2023-09-14 ENCOUNTER — Ambulatory Visit: Payer: Medicare Other | Admitting: Podiatry

## 2023-09-14 ENCOUNTER — Ambulatory Visit (HOSPITAL_COMMUNITY)

## 2023-09-14 VITALS — Ht 68.5 in | Wt 195.2 lb

## 2023-09-14 DIAGNOSIS — M79675 Pain in left toe(s): Secondary | ICD-10-CM

## 2023-09-14 DIAGNOSIS — B351 Tinea unguium: Secondary | ICD-10-CM

## 2023-09-14 DIAGNOSIS — M79674 Pain in right toe(s): Secondary | ICD-10-CM

## 2023-09-14 DIAGNOSIS — E119 Type 2 diabetes mellitus without complications: Secondary | ICD-10-CM

## 2023-09-14 NOTE — Telephone Encounter (Signed)
 Patient identification verified by 2 forms. Marilynn Rail, RN    Called and spoke to patients wife Adria Devon states:   -patient was not able to complete CT today will need to be rescheduled   -likely will be scheduled after 4/25  Informed Dois Davenport:   -new order placed for labs   -complete labs prior to new scheduled CT appointment  Dois Davenport has no further questions at this time

## 2023-09-14 NOTE — Progress Notes (Signed)
This patient returns to the office for evaluation and treatment of long thick painful nails .  This patient is unable to trim his own nails since the patient cannot reach the feet.  Patient says the nails are painful walking and wearing his shoes.  He returns for preventive foot care services.    General Appearance  Alert, conversant and in no acute stress.  Vascular  Dorsalis pedis and posterior tibial  pulses are palpable  bilaterally.  Capillary return is within normal limits  bilaterally. Temperature is within normal limits  bilaterally.  Neurologic  Senn-Weinstein monofilament wire test within normal limits  bilaterally. Muscle power within normal limits bilaterally.  Nails Thick disfigured discolored nails with subungual debris  from hallux to fifth toes bilaterally. No evidence of bacterial infection or drainage bilaterally.  Orthopedic  No limitations of motion  feet .  No crepitus or effusions noted.  No bony pathology or digital deformities noted.  Ankle/STJ immobile left foot/ankle.  Fused 1st MPJ right foot.  Skin  normotropic skin with no porokeratosis noted bilaterally.  No signs of infections or ulcers noted.     Onychomycosis  Pain in toes right foot  Pain in toes left foot  Debridement  of nails  1-5  B/L with a nail nipper.  Nails were then filed using a dremel tool with no incidents.    RTC  3 month   [name] DPM 

## 2023-09-15 ENCOUNTER — Encounter: Admitting: Rehabilitative and Restorative Service Providers"

## 2023-09-19 ENCOUNTER — Encounter: Payer: Self-pay | Admitting: Rehabilitative and Restorative Service Providers"

## 2023-09-19 ENCOUNTER — Ambulatory Visit: Admitting: Rehabilitative and Restorative Service Providers"

## 2023-09-19 DIAGNOSIS — R262 Difficulty in walking, not elsewhere classified: Secondary | ICD-10-CM | POA: Diagnosis not present

## 2023-09-19 DIAGNOSIS — M5459 Other low back pain: Secondary | ICD-10-CM | POA: Diagnosis not present

## 2023-09-19 DIAGNOSIS — M25551 Pain in right hip: Secondary | ICD-10-CM

## 2023-09-19 DIAGNOSIS — M6281 Muscle weakness (generalized): Secondary | ICD-10-CM | POA: Diagnosis not present

## 2023-09-19 NOTE — Therapy (Signed)
 OUTPATIENT PHYSICAL THERAPY LOWER EXTREMITY   Patient Name: Troy Banks MRN: [id number] DOB:February 20, 1941, 83 y.o., male Today's Date: 09/19/2023  END OF SESSION:  PT End of Session - 09/19/23 1027     Visit Number 3    Number of Visits 20    Date for PT Re-Evaluation 11/10/23    Authorization Type [employer] Medicare    Progress Note Due on Visit 10    PT Start Time 1010    PT Stop Time 1052    PT Time Calculation (min) 42 min    Activity Tolerance Patient tolerated treatment well    Behavior During Therapy WFL for tasks assessed/performed               Past Medical History:  Diagnosis Date   Allergy    Anxiety    PTSD   Aortic stenosis, moderate 03/27/2015   post AVR echo Echo 11/16: Mild LVH, EF 55-60%, normal wall motion, grade 1 diastolic dysfunction, bioprosthetic AVR okay (mean gradient 18 mmHg) with trivial AI, mild LAE, atrial septal aneurysm, small effusion    Arthritis    "qwhere" (10/29/2015)   Cancer (HCC)    skin cancer   Carotid artery stenosis    Carotid US 11/17: 1-39% bilateral ICA stenosis. F/u prn   Cervical stenosis of spinal canal    fusion 2006   CHF (congestive heart failure) (HCC)    "secondry to [facility]"   Closed left pilon fracture    COLONIC POLYPS, HX OF    Complication of anesthesia    "takes a lot to put him under and has woken up during surgery" Difficult to wake up . PTSD; "does not metabolize RX well"that he gets "violent", per pt.    Coronary artery disease    Depression    Difficult intubation 03/30/2015   Glidescop and 4 used 10/28/22, 04/07/23 due to history   DIVERTICULOSIS, COLON    Dyspnea    Dysrhythmia    2 bouts of afib post op and at home but corrected with medication.   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 Lalla Brothers   PTSD (post-traumatic stress disorder)    PTSD (post-traumatic stress disorder)    Sleep apnea    On a CPAP   Past Surgical History:  Procedure Laterality Date   ANKLE FUSION Left 03/17/2017   Procedure: FUSION  PILON FRACTURE WITH BONE GRAFTING;  Surgeon: Roby Lofts, MD;  Location: [facility] OR;  Service: Orthopedics;  Laterality: Left;   ANTERIOR FUSION CERVICAL SPINE  2002   AORTIC VALVE REPLACEMENT N/A 03/30/2015   Procedure: AORTIC VALVE REPLACEMENT (AVR);  Surgeon: Delight Ovens, MD;  Location: [facility] OR;  Service: Open Heart Surgery;  Laterality: N/A;   ATRIAL FIBRILLATION ABLATION N/A 04/07/2023   Procedure: ATRIAL FIBRILLATION ABLATION;  Surgeon: Lanier Prude, MD;  Location: [facility] INVASIVE CV LAB;  Service: Cardiovascular;  Laterality: N/A;   CARDIAC CATHETERIZATION N/A 03/26/2015   Procedure: Left Heart Cath and Coronary Angiography;  Surgeon: Marykay Lex, MD;  Location: [facility] INVASIVE CV LAB;  Service: Cardiovascular;  Laterality: N/A;   CARDIAC VALVE REPLACEMENT     CHOLECYSTECTOMY N/A 10/28/2022   Procedure: LAPAROSCOPIC CHOLECYSTECTOMY WITH INTRAOPERATIVE CHOLANGIOGRAM;  Surgeon: Luretha Murphy, MD;  Location: [facility] ORS;  Service: General;  Laterality: N/A;   COLONOSCOPY W/ POLYPECTOMY     CORONARY ARTERY BYPASS GRAFT N/A 03/30/2015   Procedure: CORONARY ARTERY BYPASS GRAFTING (CABG) x four, using left internal mammary artery and left    leg greater saphenous vein  harvested endoscopically ;  Surgeon: Delight Ovens, MD;  Location: [facility] OR;  Service: Open Heart Surgery;  Laterality: N/A;   CORONARY/GRAFT ANGIOGRAPHY N/A 12/03/2021   Procedure: CORONARY/GRAFT ANGIOGRAPHY;  Surgeon: Swaziland, [name], MD;  Location: [facility] INVASIVE CV LAB;  Service: Cardiovascular;  Laterality: N/A;   CYSTOSCOPY/URETEROSCOPY/HOLMIUM LASER/STENT PLACEMENT Bilateral 11/07/2016   Procedure: CYSTOSCOPY/URETEROSCOPY/HOLMIUM LASER/STENT PLACEMENT;  Surgeon: Hildred Laser, MD;  Location: [facility] ORS;  Service: Urology;  Laterality: Bilateral;   CYSTOSCOPY/URETEROSCOPY/HOLMIUM LASER/STENT PLACEMENT Bilateral 11/23/2016   Procedure: CYSTOSCOPY/BILATERAL URETEROSCOPY/HOLMIUM LASER/STENT EXCHANGE;  Surgeon: Hildred Laser, MD;  Location: [facility] ORS;  Service: Urology;  Laterality: Bilateral;  NEEDS 90 MIN    EXTERNAL FIXATION LEG Left 02/22/2017   Procedure: EXTERNAL FIXATION LEFT ANKLE;  Surgeon: Sheral Apley, MD;  Location: [facility] OR;  Service: Orthopedics;  Laterality: Left;   HEMORRHOID BANDING  1970s   INGUINAL HERNIA REPAIR Left 1962   KIDNEY STONE SURGERY  1986   "cut me open"   LEFT ATRIAL APPENDAGE OCCLUSION N/A 07/13/2023   Procedure: LEFT ATRIAL APPENDAGE OCCLUSION;  Surgeon: Lanier Prude, MD;  Location: [facility] INVASIVE CV LAB;  Service: Cardiovascular;  Laterality: N/A;   LEFT HEART CATH AND CORS/GRAFTS ANGIOGRAPHY N/A 10/25/2018   Procedure: LEFT HEART CATH AND CORS/GRAFTS ANGIOGRAPHY;  Surgeon: Marykay Lex, MD;  Location: [facility] INVASIVE CV LAB;  Service: Cardiovascular;  Laterality: N/A;   LEFT HEART CATH AND CORS/GRAFTS ANGIOGRAPHY N/A 12/03/2021   Procedure: LEFT HEART CATH AND CORS/GRAFTS ANGIOGRAPHY;  Surgeon: Swaziland, [name], MD;  Location: [facility] INVASIVE CV LAB;  Service: Cardiovascular;  Laterality: N/A;   POSTERIOR FUSION CERVICAL SPINE  2006   TEE WITHOUT CARDIOVERSION N/A 03/30/2015   Procedure: TRANSESOPHAGEAL ECHOCARDIOGRAM (TEE);  Surgeon: Delight Ovens, MD;   Location: [facility] OR;  Service: Open Heart Surgery;  Laterality: N/A;   TESTICLE TORSION REDUCTION Right 1960   TOE FUSION Left 1985 & 2004   "great toe" and removal   TONSILLECTOMY     TOTAL HIP ARTHROPLASTY Right 10/27/2015   Procedure: RIGHT TOTAL HIP ARTHROPLASTY ANTERIOR APPROACH and steroid injection right foot;  Surgeon: Kathryne Hitch, MD;  Location: [facility] OR;  Service: Orthopedics;  Laterality: Right;   TOTAL HIP ARTHROPLASTY Left 08/13/2019   TOTAL HIP ARTHROPLASTY Left 08/13/2019   Procedure: LEFT TOTAL HIP ARTHROPLASTY ANTERIOR APPROACH;  Surgeon: Kathryne Hitch, MD;  Location: [facility] OR;  Service: Orthopedics;  Laterality: Left;   TRANSESOPHAGEAL ECHOCARDIOGRAM (CATH LAB) N/A 07/13/2023   Procedure: TRANSESOPHAGEAL ECHOCARDIOGRAM;  Surgeon: Lanier Prude, MD;  Location: [facility] INVASIVE CV LAB;  Service: Cardiovascular;  Laterality: N/A;   Patient Active Problem List   Diagnosis Date Noted   Bursitis of right hip 08/12/2023   Hyperglycemia due to type 2 diabetes mellitus (HCC) 08/09/2023   Atrial fibrillation (HCC) 07/13/2023   Presence of Watchman left atrial appendage closure device 07/13/2023   Encounter for well adult exam with abnormal findings 06/28/2023   Cough 05/20/2023   Smoker 12/27/2022   Acute pain 12/22/2022   Cervical spondylosis without myelopathy 12/22/2022   Cervicogenic headache 12/22/2022   Medication overuse headache 12/22/2022   Hyperlipidemia 12/22/2022   Idiopathic peripheral neuropathy 12/22/2022   Prediabetes 12/22/2022   Cervicalgia 10/19/2022   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 10/14/2022   Right inguinal hernia 10/05/2022   Age-related nuclear cataract, bilateral 09/23/2022   Cysts of right upper eyelid 09/23/2022   Other specified disorders of eyelid 09/23/2022   Exposure to potentially hazardous substance 09/23/2022   Atrial flutter (HCC) 09/15/2022   Skin lesion 06/28/2022   Pain of left scapula 04/20/2022   Unstable angina (HCC)  12/02/2021   Intractable chronic migraine without aura and without status migrainosus 11/11/2021   Vitamin D deficiency 06/13/2021   Encounter for fitting and adjustment of hearing aid 11/26/2020   Encounter for fitting and adjustment of spectacles and contact lenses 11/26/2020   DNR (do not resuscitate) discussion 11/26/2020   Aortic atherosclerosis (HCC) 11/26/2020   Allergic rhinitis 10/05/2020   Sensorineural hearing loss, bilateral 09/29/2020   Hallux rigidus 09/23/2020   Retinoschisis 09/23/2020   RUQ pain 06/10/2020   History of lacunar cerebrovascular accident 04/05/2020   Chronic right-sided headache 03/16/2020   Ankle pain 01/22/2020   Hypothyroidism 11/26/2019   Epididymitis 08/28/2019   Unilateral primary osteoarthritis, left hip 08/13/2019   Status post total replacement of left hip 08/13/2019   Abdominal wall hernia 11/02/2018   Progressive angina (HCC) 10/25/2018   DOE (dyspnea on exertion) 10/25/2018   Abscess of right buttock 11/27/2017   Pilon fracture 03/17/2017   Pilon fracture of left tibia 03/17/2017   At risk for adverse drug reaction 02/28/2017   Closed fracture dislocation of ankle joint, left, with delayed healing, subsequent encounter 02/22/2017   Renal stone 10/26/2016   Gross hematuria 10/26/2016   Bladder neck obstruction 10/26/2016   External hemorrhoid, thrombosed 06/10/2016   Internal hemorrhoids 06/10/2016   Lumbar back pain 05/19/2016   Osteoarthritis of right hip 10/27/2015   Chronic chest pain 09/22/2015   Acute gouty arthritis 06/11/2015   Coronary artery disease involving native coronary artery with angina pectoris (HCC) 06/11/2015   Chronic diastolic CHF (congestive heart failure) (HCC) 06/11/2015   H/O tissue AVR Oct 2016 06/10/2015   Paroxysmal atrial fibrillation (HCC) 06/10/2015   Dyslipidemia 06/10/2015   S/P CABG x 4-Oct 2016 03/30/2015   H/O NSTEMI-Oct 2016 03/27/2015   Poor drug metabolizer due to cytochrome p450 CYP2D6  variant (HCC)    Depression 09/08/2014   GERD (gastroesophageal reflux disease)    Essential tremor 03/31/2014   Musculoskeletal pain 01/01/2014   Spondylolisthesis at L4-L5 level 01/01/2014   Sinus bradycardia 12/31/2013   Greater trochanteric bursitis of right hip 12/04/2013   OSA on CPAP    Diet-controlled diabetes mellitus (HCC) 07/08/2009   History of colonic polyps 12/31/2008   GOUT 12/22/2008   Essential hypertension 12/22/2008   Diverticulosis of colon 12/22/2008   NEPHROLITHIASIS, HX OF 12/22/2008    PCP: Corwin Levins, MD    REFERRING PROVIDER: Persons, [place], [place]   REFERRING DIAG:  Diagnosis  M25.551 (ICD-10-CM) - Pain in right hip    THERAPY DIAG:  Right hip pain  Muscle weakness (generalized)  Difficulty in walking, not elsewhere classified  Other low back pain  Rationale for Evaluation and Treatment: Rehabilitation  ONSET DATE: 3-4 months progressively getting worse  SUBJECTIVE:    SUBJECTIVE STATEMENT Pt indicated no pain upon arrival and feeling like it was a good day.  Pt indicated improved walking and family said he wasn't complaining as much.   PERTINENT HISTORY: THA on left 08/13/2019, THA on Rt 10/27/2015, A-fib, Cardiac valve replacement, ankle fusion 03/17/2017  PAIN:  NPRS scale: no pain upon arrival.  Pain location: Rt hip Pain description: achy, throbbing, soreness, stiffness Aggravating factors: standing, transfers, walking Relieving factors: resting, changing positions helps some but doesn't completely resolve  PRECAUTIONS: none  WEIGHT BEARING RESTRICTIONS: No  FALLS:  Has patient fallen in last 6 months? No  LIVING ENVIRONMENT: Lives with: lives with their family and lives with their spouse Lives in: House/apartment  Has following equipment at home: Single point cane, Environmental consultant - 2 wheeled, and shower chair  OCCUPATION: retired  PLOF: Independent  PATIENT GOALS: walk to my grandchild's graduation.   Next MD  visit:   OBJECTIVE:   PATIENT SURVEYS:  Patient-Specific Activity Scoring Scheme  "0" represents "unable to perform." "10" represents "able to perform at prior level. 0 1 2 3 4 5 6 7 8 9  10 (Date and Score)   Activity Eval  08/28/23    1. walking  5    2. dressing  7    3. Preparing a meal 4   4.    5.    Score 16/3= 5.3    Total score = sum of the activity scores/number of activities Minimum detectable change (90%CI) for average score = 2 points Minimum detectable change (90%CI) for single activity score = 3 points  COGNITION: Overall cognitive status: WFL    SENSATION: Number and tingling down Rt LE into     POSTURE:  rounded shoulders, forward head, and decreased lumbar lordosis  PALPATION: TTP: Rt vastus intermedius  LOWER EXTREMITY ROM:   ROM Right Eval Active supine Left Eval Active supine  Hip flexion 95 104  Hip extension    Hip abduction    Hip adduction    Hip internal rotation    Hip external rotation    Knee flexion 125 112  Knee extension -6 -6  Ankle dorsiflexion    Ankle plantarflexion    Ankle inversion    Ankle eversion     (Blank rows = not tested)  LOWER EXTREMITY MMT:  MMT Right eval Left eval Right 09/19/2023 Left 09/19/2023  Hip flexion 5 5 4+/5 5/5  Hip extension      Hip abduction 3 3    Hip adduction 3 3    Hip internal rotation 3 3 4/5 4/5  Hip external rotation 3 3 4/5 4/5  Knee flexion      Knee extension      Ankle dorsiflexion      Ankle plantarflexion      Ankle inversion      Ankle eversion       (Blank rows = not tested)    FUNCTIONAL TESTS:  08/28/23: 5 time sit to stand: 27.6 seconds c UE support   GAIT: 08/28/23 Distance walked: 30 feet clinic distances level surfaces Assistive device utilized: Single point cane Level of assistance: Modified independence Comments: forward flexed posture, increased bilateral  knee flexion  during stance phase, decreased heel to toe strike bilaterally and  decreased Dorsiflexion and mobility in left ankle.                                                                                                                                                                         TODAY'S TREATMENT                                                                          DATE:  09/19/2023 Therex: Nustep Lvl 5 10 mins UE/LE Supine hooklying Lumbar trunk rotation 30 sec x 3 bilateral  Bridges with ball squeeze x 10 holding 5 sec Supine hooklying bridge 5 sec hold 2 x 10  Supine hooklying clam shell green band isometric hold contralateral leg x 20   Additional time in review of exercise for HEP.     TherActivity: (to improve functional movements including transfers, squatting and bed mobility) Review of supine to sit to supine log rolling (pt require cues and practice) Sit to stand sit 20 inch table height s UE assist.     TODAY'S TREATMENT                                                                          DATE:09/12/23:  Therex:  Nustep: 8 minutes, level 5 UE/LE Standing hip extension x 15 bil c UE support Standing hip abduction x 15 bil c UE support Standing trunk extension elbows on the wall  x 5 holding 10 sec Seated hamstring stretch x 2 bil holding 30 sec Supine trunk rotation x 4 bil holding 30 sec Bridges: x 10 holding 5 sec Bridges with ball squeeze x 10 holding 5 sec TherActivites:  Mini squats c UE support x 10  Sit to stand: x 10 c no UE support Mat mobility: log rolling for spinal alignment and to decrease pt's pain    08/28/23 HEP instruction/performance c cues for techniques, handout provided.  Trial set performed of each for comprehension and symptom assessment.  See below for exercise list    PATIENT EDUCATION:  Education details: HEP, POC Person educated: Patient Education method: Explanation, Demonstration, Verbal cues, and Handouts Education comprehension: verbalized  understanding, returned demonstration, and  verbal cues required  HOME EXERCISE PROGRAM: Access Code: [email or web address] URL: [email or web address] Date: 08/28/2023 Prepared by: Narda Amber  Exercises - Supine Lower Trunk Rotation  - 2 x daily - 7 x weekly - 4 reps - 20 seconds hold - Supine March  - 2 x daily - 7 x weekly - 20 reps - Hooklying Isometric Clamshell  - 2 x daily - 7 x weekly - 3 sets - 10 reps - Supine Hip Adduction Isometric with Ball  - 2 x daily - 7 x weekly - 2 sets - 10 reps - 5 seconds hold  ASSESSMENT:  CLINICAL IMPRESSION: Consistent cues needed for HEP techniques in review.  Overall reporting better symptoms to this point.    OBJECTIVE IMPAIRMENTS: Abnormal gait, decreased balance, decreased mobility, difficulty walking, decreased ROM, decreased strength, impaired flexibility, and pain.   ACTIVITY LIMITATIONS: lifting, bending, sitting, standing, squatting, sleeping, stairs, transfers, and dressing  PARTICIPATION LIMITATIONS: meal prep and community activity  PERSONAL FACTORS: 3+ comorbidities: see PMH above  are also affecting patient's functional outcome.   REHAB POTENTIAL: Good  CLINICAL DECISION MAKING: Evolving/moderate complexity  EVALUATION COMPLEXITY: Moderate   GOALS: Goals reviewed with patient? Yes  SHORT TERM GOALS: (target date for Short term goals are 3 weeks 09/18/2023)   1.  Patient will demonstrate independent use of home exercise program to maintain progress from in clinic treatments.  Goal status: partially met (cues still needed)  LONG TERM GOALS: (target dates for all long term goals are 10 weeks  11/10/2023 )   1. Patient will demonstrate/report pain at worst less than or equal to 2/10 to facilitate minimal limitation in daily activity secondary to pain symptoms.  Goal status: New   2. Patient will demonstrate independent use of home exercise program to facilitate ability to maintain/progress functional gains from skilled physical therapy services.  Goal  status: New   3. Patient will demonstrate Patient specific functional scale avg > or = 7.3 to indicate reduced disability due to condition.   Goal status: New   4.  Patient will demonstrate bil LE MMT >/= 4/5 throughout to faciltiate usual transfers, stairs, squatting at [facility] for daily life.   Goal status: New   5.  Patient will demonstrate 5 time sit to stand </= 15 seconds c/s UE support.  Goal status: New   6.  Pt will be able to walk >/= 500 feet on community surfaces with LRAD.  Goal status: New      PLAN:  PT FREQUENCY: 1-2x/week  PT DURATION: 10 weeks  PLANNED INTERVENTIONS: Can include [id number]- PT Re-evaluation, 97110-Therapeutic exercises, 97530- Therapeutic activity, 97112- Neuromuscular re-education, 97535- Self Care, 97140- Manual therapy, [phone]- Gait training, [phone]- Orthotic Fit/training, [phone]- Canalith repositioning, [id number]- Aquatic Therapy, [phone]- Electrical stimulation (unattended), [phone]- Electrical stimulation (manual), [id number] Physical performance testing, 97016- Vasopneumatic device, [id number]- Ultrasound, [id number]- Traction (mechanical), [id number]- Ionotophoresis 4mg /ml Dexamethasone, Patient/Family education, Balance training, Stair training, Taping, Dry Needling, Joint mobilization, Joint manipulation, Spinal manipulation, Spinal mobilization, Scar mobilization, Vestibular training, Visual/preceptual remediation/compensation, DME instructions, Cryotherapy, and Moist heat.  All performed as medically necessary.  All included unless contraindicated  PLAN FOR NEXT SESSION:Continue HEP review/transfers review.   Chyrel Masson, PT, DPT, OCS, ATC 09/19/23  11:03 AM     Date of referral: 3/725 Referring provider: Persons, [place], [place]  Referring diagnosis? REFERRING DIAG:  Diagnosis  M25.551 (ICD-10-CM) - Pain in right hip   Treatment diagnosis? (if different than referring diagnosis) M25.551, M62.81, R26.2,  M54.59  What was this (referring dx) caused by? Ongoing  Issue  Lonne Roan of Condition: Initial Onset (within last 3 months)   Laterality: Both  Current Functional Measure Score: Other 5.3 : pt specific activity score  Objective measurements identify impairments when they are compared to normal values, the uninvolved extremity, and prior level of function.  [x]  Yes  []  No  Objective assessment of functional ability: Moderate functional limitations   Briefly describe symptoms: Rt and left hip pain, c Rt being worse  How did symptoms start: over time and worsening over the last 3-4 months. Pt c history of bilateral hip replacements.   Average pain intensity:  Last 24 hours: 8/10 mostly Rt hip  Past week: can reach 8/10 at times, 4/10 at the lowest during the last week .   How often does the pt experience symptoms? Constantly  How much have the symptoms interfered with usual daily activities? Quite a bit  How has condition changed since care began at this facility? NA - initial visit  In general, how is the patients overall health? Fair   BACK PAIN (STarT Back Screening Tool) Has pain spread down the leg(s) at some time in the last 2 weeks? yes Has there been pain in the shoulder or neck at some time in the last 2 weeks? Yes, Rt shoulder Has the pt only walked short distances because of back pain? yes Has patient dressed more slowly because of back pain in the past 2 weeks? no Does patient think it's not safe for a person with this condition to be physically active? no Does patient have worrying thoughts a lot of the time? no Does patient feel back pain is terrible and will never get any better? no Has patient stopped enjoying things they usually enjoy? yes

## 2023-09-19 NOTE — Telephone Encounter (Signed)
 Spoke with Ms. Pyon. Rescheduled the patient's cCT to 10/19/2023. She was grateful for call and agreed with plan.

## 2023-09-21 ENCOUNTER — Ambulatory Visit: Admitting: Rehabilitative and Restorative Service Providers"

## 2023-09-21 ENCOUNTER — Encounter: Payer: Self-pay | Admitting: Rehabilitative and Restorative Service Providers"

## 2023-09-21 DIAGNOSIS — R262 Difficulty in walking, not elsewhere classified: Secondary | ICD-10-CM | POA: Diagnosis not present

## 2023-09-21 DIAGNOSIS — M6281 Muscle weakness (generalized): Secondary | ICD-10-CM

## 2023-09-21 DIAGNOSIS — M25551 Pain in right hip: Secondary | ICD-10-CM

## 2023-09-21 DIAGNOSIS — M5459 Other low back pain: Secondary | ICD-10-CM | POA: Diagnosis not present

## 2023-09-21 NOTE — Therapy (Signed)
 OUTPATIENT PHYSICAL THERAPY LOWER EXTREMITY TREATMENT  Patient Name: Troy Banks MRN: [id number] DOB:[birth year], 83 y.o., male Today's Date: 09/21/2023  END OF SESSION:  PT End of Session - 09/21/23 1102     Visit Number 4    Number of Visits 20    Date for PT Re-Evaluation 11/10/23    Authorization Type [employer] Medicare    Progress Note Due on Visit 10    PT Start Time 1101    PT Stop Time 1147    PT Time Calculation (min) 46 min    Activity Tolerance Patient tolerated treatment well;No increased pain;Patient limited by fatigue    Behavior During Therapy [facility] for tasks assessed/performed             Past Medical History:  Diagnosis Date   Allergy    Anxiety    PTSD   Aortic stenosis, moderate 03/27/2015   post AVR echo Echo 11/16: Mild LVH, EF 55-60%, normal wall motion, grade 1 diastolic dysfunction, bioprosthetic AVR okay (mean gradient 18 mmHg) with trivial AI, mild LAE, atrial septal aneurysm, small effusion    Arthritis    "qwhere" (10/29/2015)   Cancer (HCC)    skin cancer   Carotid artery stenosis    Carotid US 11/17: 1-39% bilateral ICA stenosis. F/u prn   Cervical stenosis of spinal canal    fusion 2006   CHF (congestive heart failure) (HCC)    "secondry to O[facility]"   Closed left pilon fracture    COLONIC POLYPS, HX OF    Complication of anesthesia    "takes a lot to put him under and has woken up during surgery" Difficult to wake up . PTSD; "does not metabolize RX well"that he gets "violent", per pt.    Coronary artery disease    Depression    Difficult intubation 03/30/2015   Glidescop and 4 used 10/28/22, 04/07/23 due to history   DIVERTICULOSIS, COLON    Dyspnea    Dysrhythmia    2 bouts of afib post op and at home but corrected with medication.   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 Lalla Brothers   PTSD (post-traumatic stress disorder)    PTSD (post-traumatic stress disorder)    Sleep apnea    On a CPAP   Past Surgical History:  Procedure Laterality Date   ANKLE FUSION Left 03/17/2017   Procedure: FUSION  PILON FRACTURE WITH BONE GRAFTING;  Surgeon: Roby Lofts, MD;  Location: [facility] OR;  Service: Orthopedics;  Laterality: Left;   ANTERIOR FUSION CERVICAL SPINE  2002   AORTIC VALVE REPLACEMENT N/A 03/30/2015   Procedure: AORTIC  VALVE REPLACEMENT (AVR);  Surgeon: Norita Beauvais, MD;  Location: [facility] OR;  Service: Open Heart Surgery;  Laterality: N/A;   ATRIAL FIBRILLATION ABLATION N/A 04/07/2023   Procedure: ATRIAL FIBRILLATION ABLATION;  Surgeon: Boyce Byes, MD;  Location: [facility] INVASIVE CV LAB;  Service: Cardiovascular;  Laterality: N/A;   CARDIAC CATHETERIZATION N/A 03/26/2015   Procedure: Left Heart Cath and Coronary Angiography;  Surgeon: Arleen Lacer, MD;  Location: [facility] INVASIVE CV LAB;  Service: Cardiovascular;  Laterality: N/A;   CARDIAC VALVE REPLACEMENT     CHOLECYSTECTOMY N/A 10/28/2022   Procedure: LAPAROSCOPIC CHOLECYSTECTOMY WITH INTRAOPERATIVE CHOLANGIOGRAM;  Surgeon: Jacolyn Matar, MD;  Location: [facility] ORS;  Service: General;  Laterality: N/A;   COLONOSCOPY W/ POLYPECTOMY     CORONARY ARTERY BYPASS GRAFT N/A 03/30/2015   Procedure: CORONARY ARTERY BYPASS GRAFTING (CABG) x four, using left internal mammary artery and  left    leg greater saphenous vein harvested endoscopically ;  Surgeon: Norita Beauvais, MD;  Location: [facility] OR;  Service: Open Heart Surgery;  Laterality: N/A;   CORONARY/GRAFT ANGIOGRAPHY N/A 12/03/2021   Procedure: CORONARY/GRAFT ANGIOGRAPHY;  Surgeon: Swaziland, [name], MD;  Location: [facility] INVASIVE CV LAB;  Service: Cardiovascular;  Laterality: N/A;   CYSTOSCOPY/URETEROSCOPY/HOLMIUM LASER/STENT PLACEMENT Bilateral 11/07/2016   Procedure: CYSTOSCOPY/URETEROSCOPY/HOLMIUM LASER/STENT PLACEMENT;  Surgeon: Bart Born, MD;  Location: [facility] ORS;  Service: Urology;  Laterality: Bilateral;   CYSTOSCOPY/URETEROSCOPY/HOLMIUM LASER/STENT PLACEMENT Bilateral 11/23/2016   Procedure: CYSTOSCOPY/BILATERAL URETEROSCOPY/HOLMIUM LASER/STENT EXCHANGE;  Surgeon: Bart Born, MD;  Location: [facility] ORS;  Service: Urology;  Laterality: Bilateral;  NEEDS 90 MIN    EXTERNAL FIXATION LEG Left 02/22/2017   Procedure: EXTERNAL FIXATION LEFT ANKLE;  Surgeon: Saundra Curl, MD;  Location: [facility] OR;  Service: Orthopedics;  Laterality: Left;   HEMORRHOID BANDING  1970s   INGUINAL HERNIA REPAIR Left 1962   KIDNEY STONE SURGERY  1986   "cut me open"   LEFT ATRIAL APPENDAGE OCCLUSION N/A 07/13/2023   Procedure: LEFT ATRIAL APPENDAGE OCCLUSION;  Surgeon: Boyce Byes, MD;  Location: [facility] INVASIVE CV LAB;  Service: Cardiovascular;  Laterality: N/A;   LEFT HEART CATH AND CORS/GRAFTS ANGIOGRAPHY N/A 10/25/2018   Procedure: LEFT HEART CATH AND CORS/GRAFTS ANGIOGRAPHY;  Surgeon: Arleen Lacer, MD;  Location: [facility] INVASIVE CV LAB;  Service: Cardiovascular;  Laterality: N/A;   LEFT HEART CATH AND CORS/GRAFTS ANGIOGRAPHY N/A 12/03/2021   Procedure: LEFT HEART CATH AND CORS/GRAFTS ANGIOGRAPHY;  Surgeon: Swaziland, [name], MD;  Location: [facility] INVASIVE CV LAB;  Service: Cardiovascular;  Laterality: N/A;   POSTERIOR FUSION CERVICAL SPINE  2006   TEE WITHOUT CARDIOVERSION N/A 03/30/2015   Procedure: TRANSESOPHAGEAL ECHOCARDIOGRAM (TEE);   Surgeon: Norita Beauvais, MD;  Location: [facility] OR;  Service: Open Heart Surgery;  Laterality: N/A;   TESTICLE TORSION REDUCTION Right 1960   TOE FUSION Left 1985 & 2004   "great toe" and removal   TONSILLECTOMY     TOTAL HIP ARTHROPLASTY Right 10/27/2015   Procedure: RIGHT TOTAL HIP ARTHROPLASTY ANTERIOR APPROACH and steroid injection right foot;  Surgeon: Arnie Lao, MD;  Location: [facility] OR;  Service: Orthopedics;  Laterality: Right;   TOTAL HIP ARTHROPLASTY Left 08/13/2019   TOTAL HIP ARTHROPLASTY Left 08/13/2019   Procedure: LEFT TOTAL HIP ARTHROPLASTY ANTERIOR APPROACH;  Surgeon: Arnie Lao, MD;  Location: [facility] OR;  Service: Orthopedics;  Laterality: Left;   TRANSESOPHAGEAL ECHOCARDIOGRAM (CATH LAB) N/A 07/13/2023   Procedure: TRANSESOPHAGEAL ECHOCARDIOGRAM;  Surgeon: Boyce Byes, MD;  Location: [facility]  INVASIVE CV LAB;  Service: Cardiovascular;  Laterality: N/A;   Patient Active Problem List   Diagnosis Date Noted   Bursitis of right hip 08/12/2023   Hyperglycemia due to type 2 diabetes mellitus (HCC) 08/09/2023   Atrial fibrillation (HCC) 07/13/2023   Presence of Watchman left atrial appendage closure device 07/13/2023   Encounter for well adult exam with abnormal findings 06/28/2023   Cough 05/20/2023   Smoker 12/27/2022   Acute pain 12/22/2022   Cervical spondylosis without myelopathy 12/22/2022   Cervicogenic headache 12/22/2022   Medication overuse headache 12/22/2022   Hyperlipidemia 12/22/2022   Idiopathic peripheral neuropathy 12/22/2022   Prediabetes 12/22/2022   Cervicalgia 10/19/2022   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 10/14/2022   Right inguinal hernia 10/05/2022   Age-related nuclear cataract, bilateral 09/23/2022   Cysts of right upper eyelid 09/23/2022   Other specified disorders of eyelid 09/23/2022   Exposure to potentially hazardous substance 09/23/2022   Atrial flutter (HCC) 09/15/2022   Skin lesion 06/28/2022   Pain of left scapula  04/20/2022   Unstable angina (HCC) 12/02/2021   Intractable chronic migraine without aura and without status migrainosus 11/11/2021   Vitamin D deficiency 06/13/2021   Encounter for fitting and adjustment of hearing aid 11/26/2020   Encounter for fitting and adjustment of spectacles and contact lenses 11/26/2020   DNR (do not resuscitate) discussion 11/26/2020   Aortic atherosclerosis (HCC) 11/26/2020   Allergic rhinitis 10/05/2020   Sensorineural hearing loss, bilateral 09/29/2020   Hallux rigidus 09/23/2020   Retinoschisis 09/23/2020   RUQ pain 06/10/2020   History of lacunar cerebrovascular accident 04/05/2020   Chronic right-sided headache 03/16/2020   Ankle pain 01/22/2020   Hypothyroidism 11/26/2019   Epididymitis 08/28/2019   Unilateral primary osteoarthritis, left hip 08/13/2019   Status post total replacement of left hip 08/13/2019   Abdominal wall hernia 11/02/2018   Progressive angina (HCC) 10/25/2018   DOE (dyspnea on exertion) 10/25/2018   Abscess of right buttock 11/27/2017   Pilon fracture 03/17/2017   Pilon fracture of left tibia 03/17/2017   At risk for adverse drug reaction 02/28/2017   Closed fracture dislocation of ankle joint, left, with delayed healing, subsequent encounter 02/22/2017   Renal stone 10/26/2016   Gross hematuria 10/26/2016   Bladder neck obstruction 10/26/2016   External hemorrhoid, thrombosed 06/10/2016   Internal hemorrhoids 06/10/2016   Lumbar back pain 05/19/2016   Osteoarthritis of right hip 10/27/2015   Chronic chest pain 09/22/2015   Acute gouty arthritis 06/11/2015   Coronary artery disease involving native coronary artery with angina pectoris (HCC) 06/11/2015   Chronic diastolic CHF (congestive heart failure) (HCC) 06/11/2015   H/O tissue AVR Oct 2016 06/10/2015   Paroxysmal atrial fibrillation (HCC) 06/10/2015   Dyslipidemia 06/10/2015   S/P CABG x 4-Oct 2016 03/30/2015   H/O NSTEMI-Oct 2016 03/27/2015   Poor drug metabolizer  due to cytochrome p450 CYP2D6 variant (HCC)    Depression 09/08/2014   GERD (gastroesophageal reflux disease)    Essential tremor 03/31/2014   Musculoskeletal pain 01/01/2014   Spondylolisthesis at L4-L5 level 01/01/2014   Sinus bradycardia 12/31/2013   Greater trochanteric bursitis of right hip 12/04/2013   OSA on CPAP    Diet-controlled diabetes mellitus (HCC) 07/08/2009   History of colonic polyps 12/31/2008   GOUT 12/22/2008   Essential hypertension 12/22/2008   Diverticulosis of colon 12/22/2008   NEPHROLITHIASIS, HX OF 12/22/2008    PCP: Roslyn Coombe, MD    REFERRING PROVIDER: Persons, Norma Beckers, PA   REFERRING  DIAG:  Diagnosis  M25.551 (ICD-10-CM) - Pain in right hip    THERAPY DIAG:  Right hip pain  Muscle weakness (generalized)  Difficulty in walking, not elsewhere classified  Other low back pain  Rationale for Evaluation and Treatment: Rehabilitation  ONSET DATE: 3-4 months progressively getting worse  SUBJECTIVE:    SUBJECTIVE STATEMENT Chuck reports difficulty with standing or walking for more than a "short" period of time due to low back and Right upper gluteal pain.  PERTINENT HISTORY: THA on left 08/13/2019, THA on Rt 10/27/2015, A-fib, Cardiac valve replacement, ankle fusion 03/17/2017  PAIN:  NPRS scale: 3-4/10 this week Pain location: Rt hip Pain description: achy, throbbing, soreness, stiffness Aggravating factors: standing, transfers, walking Relieving factors: resting, changing positions helps some but doesn't completely resolve  PRECAUTIONS: none  WEIGHT BEARING RESTRICTIONS: No  FALLS:  Has patient fallen in last 6 months? No  LIVING ENVIRONMENT: Lives with: lives with their family and lives with their spouse Lives in: House/apartment  Has following equipment at home: Single point cane, Environmental consultant - 2 wheeled, and shower chair  OCCUPATION: retired  PLOF: Independent  PATIENT GOALS: walk to my grandchild's graduation.   Next  MD visit:   OBJECTIVE:   PATIENT SURVEYS:  Patient-Specific Activity Scoring Scheme  "0" represents "unable to perform." "10" represents "able to perform at prior level. 0 1 2 3 4 5 6 7 8 9  10 (Date and Score)   Activity Eval  08/28/23    1. walking  5    2. dressing  7    3. Preparing a meal 4   4.    5.    Score 16/3= 5.3    Total score = sum of the activity scores/number of activities Minimum detectable change (90%CI) for average score = 2 points Minimum detectable change (90%CI) for single activity score = 3 points  COGNITION: Overall cognitive status: WFL    SENSATION: Number and tingling down Rt LE into     POSTURE:  rounded shoulders, forward head, and decreased lumbar lordosis  PALPATION: TTP: Rt vastus intermedius  LOWER EXTREMITY ROM:   ROM Right Eval Active supine Left Eval Active supine  Hip flexion 95 104  Hip extension    Hip abduction    Hip adduction    Hip internal rotation    Hip external rotation    Knee flexion 125 112  Knee extension -6 -6  Ankle dorsiflexion    Ankle plantarflexion    Ankle inversion    Ankle eversion     (Blank rows = not tested)  LOWER EXTREMITY MMT:  MMT Right eval Left eval Right 09/19/2023 Left 09/19/2023  Hip flexion 5 5 4+/5 5/5  Hip extension      Hip abduction 3 3    Hip adduction 3 3    Hip internal rotation 3 3 4/5 4/5  Hip external rotation 3 3 4/5 4/5  Knee flexion      Knee extension      Ankle dorsiflexion      Ankle plantarflexion      Ankle inversion      Ankle eversion       (Blank rows = not tested)    FUNCTIONAL TESTS:  08/28/23: 5 time sit to stand: 27.6 seconds c UE support   GAIT: 08/28/23 Distance walked: 30 feet clinic distances level surfaces Assistive device utilized: Single point cane Level of assistance: Modified independence Comments: forward flexed posture, increased bilateral  knee flexion during stance  phase, decreased heel to toe strike bilaterally and  decreased Dorsiflexion and mobility in left ankle.                                                                                                                                                                         TODAY'S TREATMENT                                                                          DATE: 09/21/2023 Yoga Bridge 2 sets of 10 for 5 seconds, slow eccentric Side lie clamshells with Black Thera-Band 2 sets of 10 for 3 seconds and slow eccentrics Reviewed and made slight modifications to HEP  Functional Activities: Sit to stand with no hands, lumbar extension and scapular retraction at the top, slow eccentrics Pull to chest Blue 20 x 3 seconds  Neuromuscular re-education: Tandem balance: 5 x 20 seconds (rest and cues for posture)   DATE:  09/19/2023 Therex: Nustep Lvl 5 10 mins UE/LE Supine hooklying Lumbar trunk rotation 30 sec x 3 bilateral  Bridges with ball squeeze x 10 holding 5 sec Supine hooklying bridge 5 sec hold 2 x 10  Supine hooklying clam shell green band isometric hold contralateral leg x 20   Additional time in review of exercise for HEP.     TherActivity: (to improve functional movements including transfers, squatting and bed mobility) Review of supine to sit to supine log rolling (pt require cues and practice) Sit to stand sit 20 inch table height s UE assist.    09/12/23:  Therex:  Nustep: 8 minutes, level 5 UE/LE Standing hip extension x 15 bil c UE support Standing hip abduction x 15 bil c UE support Standing trunk extension elbows on the wall  x 5 holding 10 sec Seated hamstring stretch x 2 bil holding 30 sec Supine trunk rotation x 4 bil holding 30 sec Bridges: x 10 holding 5 sec Bridges with ball squeeze x 10 holding 5 sec TherActivites:  Mini squats c UE support x 10  Sit to stand: x 10 c no UE support Mat mobility: log rolling for spinal alignment and to decrease pt's pain   PATIENT EDUCATION:  Education details: HEP,  POC Person educated: Patient Education method: Programmer, multimedia, Demonstration, Verbal cues, and Handouts Education comprehension: verbalized understanding, returned demonstration, and verbal cues required  HOME EXERCISE PROGRAM: Access Code: [email or web address] URL: [email or web address] Date: 09/21/2023 Prepared by: Pauletta Browns  Exercises - Supine Lower Trunk  Rotation  - 1 x daily - 1 x weekly - 4 reps - 20 seconds hold - Supine March  - 2 x daily - 1 x weekly - 20 reps - Supine Hip Adduction Isometric with Ball  - 2 x daily - 1 x weekly - 2 sets - 10 reps - 5 seconds hold - Clamshell with Resistance  - 2 x daily - 7 x weekly - 2 sets - 10 reps - 3 seconds hold - Yoga Bridge  - 2 x daily - 7 x weekly - 2 sets - 10 reps - 5 seconds hold - Tandem Stance  - 1 x daily - 7 x weekly - 1 sets - 5 reps - 20 second hold - Sit to Stand Without Arm Support  - 2 x daily - 7 x weekly - 1 sets - 5 reps - Standing Lumbar Extension at Wall - Forearms  - 5 x daily - 7 x weekly - 1 sets - 5 reps - 3 seconds hold - Standing Scapular Retraction  - 5 x daily - 7 x weekly - 1 sets - 5 reps - 5 second hold  ASSESSMENT:  CLINICAL IMPRESSION: Lesia Raring notes that his weightbearing function is most impaired.  He would like to be able to stand and walk for longer periods of time without being limited by low back and right upper gluteal pain.  We reviewed his home exercise program and added some more functional activities to help progress him and address remaining impairments to meet long-term goals.  Continue his current plan of care to improve standing and walking endurance in preparation for independent rehabilitation.  OBJECTIVE IMPAIRMENTS: Abnormal gait, decreased balance, decreased mobility, difficulty walking, decreased ROM, decreased strength, impaired flexibility, and pain.   ACTIVITY LIMITATIONS: lifting, bending, sitting, standing, squatting, sleeping, stairs, transfers, and dressing  PARTICIPATION  LIMITATIONS: meal prep and community activity  PERSONAL FACTORS: 3+ comorbidities: see PMH above  are also affecting patient's functional outcome.   REHAB POTENTIAL: Good  CLINICAL DECISION MAKING: Evolving/moderate complexity  EVALUATION COMPLEXITY: Moderate   GOALS: Goals reviewed with patient? Yes  SHORT TERM GOALS: (target date for Short term goals are 3 weeks 09/18/2023)   1.  Patient will demonstrate independent use of home exercise program to maintain progress from in clinic treatments.  Goal status: Partially met 09/21/2023 (cues still needed)  LONG TERM GOALS: (target dates for all long term goals are 10 weeks  11/10/2023 )   1. Patient will demonstrate/report pain at worst less than or equal to 2/10 to facilitate minimal limitation in daily activity secondary to pain symptoms.  Goal status: New   2. Patient will demonstrate independent use of home exercise program to facilitate ability to maintain/progress functional gains from skilled physical therapy services.  Goal status: New   3. Patient will demonstrate Patient specific functional scale avg > or = 7.3 to indicate reduced disability due to condition.   Goal status: New   4.  Patient will demonstrate bil LE MMT >/= 4/5 throughout to faciltiate usual transfers, stairs, squatting at [facility] for daily life.   Goal status: New   5.  Patient will demonstrate 5 time sit to stand </= 15 seconds c/s UE support.  Goal status: New   6.  Pt will be able to walk >/= 500 feet on community surfaces with LRAD.  Goal status: New      PLAN:  PT FREQUENCY: 1-2x/week  PT DURATION: 10 weeks  PLANNED INTERVENTIONS: Can include 09811-  PT Re-evaluation, 97110-Therapeutic exercises, 97530- Therapeutic activity, [id number]- Neuromuscular re-education, [id number]- Self Care, 78295- Manual therapy, [id number]- Gait training, [phone]- Orthotic Fit/training, [phone]- Canalith repositioning, [id number]- Aquatic Therapy, [phone]- Electrical stimulation  (unattended), [phone]- Electrical stimulation (manual), [id number] Physical performance testing, 97016- Vasopneumatic device, [id number]- Ultrasound, [id number]- Traction (mechanical), [id number]- Ionotophoresis 4mg /ml Dexamethasone, Patient/Family education, Balance training, Stair training, Taping, Dry Needling, Joint mobilization, Joint manipulation, Spinal manipulation, Spinal mobilization, Scar mobilization, Vestibular training, Visual/preceptual remediation/compensation, DME instructions, Cryotherapy, and Moist heat.  All performed as medically necessary.  All included unless contraindicated  PLAN FOR NEXT SESSION:Low back, hip abductors and general lower extremity strengthening, along with activities to improve weightbearing endurance.  Joli Neas PT, MPT 09/21/23  3:46 PM     Date of referral: 3/725 Referring provider: Persons, Norma Beckers, [place]  Referring diagnosis? REFERRING DIAG:  Diagnosis  M25.551 (ICD-10-CM) - Pain in right hip   Treatment diagnosis? (if different than referring diagnosis) M25.551, M62.81, R26.2, M54.59  What was this (referring dx) caused by? Ongoing Issue  Lonne Roan of Condition: Initial Onset (within last 3 months)   Laterality: Both  Current Functional Measure Score: Other 5.3 : pt specific activity score  Objective measurements identify impairments when they are compared to normal values, the uninvolved extremity, and prior level of function.  [x]  Yes  []  No  Objective assessment of functional ability: Moderate functional limitations   Briefly describe symptoms: Rt and left hip pain, c Rt being worse  How did symptoms start: over time and worsening over the last 3-4 months. Pt c history of bilateral hip replacements.   Average pain intensity:  Last 24 hours: 8/10 mostly Rt hip  Past week: can reach 8/10 at times, 4/10 at the lowest during the last week .   How often does the pt experience symptoms? Constantly  How much have the symptoms interfered with usual daily  activities? Quite a bit  How has condition changed since care began at this facility? NA - initial visit  In general, how is the patients overall health? Fair   BACK PAIN (STarT Back Screening Tool) Has pain spread down the leg(s) at some time in the last 2 weeks? yes Has there been pain in the shoulder or neck at some time in the last 2 weeks? Yes, Rt shoulder Has the pt only walked short distances because of back pain? yes Has patient dressed more slowly because of back pain in the past 2 weeks? no Does patient think it's not safe for a person with this condition to be physically active? no Does patient have worrying thoughts a lot of the time? no Does patient feel back pain is terrible and will never get any better? no Has patient stopped enjoying things they usually enjoy? yes

## 2023-09-25 ENCOUNTER — Encounter: Admitting: Physical Therapy

## 2023-09-26 DIAGNOSIS — M47812 Spondylosis without myelopathy or radiculopathy, cervical region: Secondary | ICD-10-CM | POA: Diagnosis not present

## 2023-09-27 ENCOUNTER — Ambulatory Visit: Admitting: Physical Therapy

## 2023-09-27 ENCOUNTER — Encounter: Payer: Self-pay | Admitting: Physical Therapy

## 2023-09-27 DIAGNOSIS — M5459 Other low back pain: Secondary | ICD-10-CM

## 2023-09-27 DIAGNOSIS — R262 Difficulty in walking, not elsewhere classified: Secondary | ICD-10-CM

## 2023-09-27 DIAGNOSIS — M6281 Muscle weakness (generalized): Secondary | ICD-10-CM

## 2023-09-27 DIAGNOSIS — M25551 Pain in right hip: Secondary | ICD-10-CM

## 2023-09-27 NOTE — Therapy (Signed)
 OUTPATIENT PHYSICAL THERAPY LOWER EXTREMITY TREATMENT  Patient Name: Troy Banks MRN: [id number] DOB:[birth year], 83 y.o., male Today's Date: 09/27/2023  END OF SESSION:  PT End of Session - 09/27/23 1117     Visit Number 5    Number of Visits 20    Date for PT Re-Evaluation 11/10/23    Authorization Type [employer] Medicare    Progress Note Due on Visit 10    PT Start Time 1020    PT Stop Time 1105    PT Time Calculation (min) 45 min    Activity Tolerance Patient tolerated treatment well;No increased pain;Patient limited by fatigue    Behavior During Therapy [facility] for tasks assessed/performed              Past Medical History:  Diagnosis Date   Allergy    Anxiety    PTSD   Aortic stenosis, moderate 03/27/2015   post AVR echo Echo 11/16: Mild LVH, EF 55-60%, normal wall motion, grade 1 diastolic dysfunction, bioprosthetic AVR okay (mean gradient 18 mmHg) with trivial AI, mild LAE, atrial septal aneurysm, small effusion    Arthritis    "qwhere" (10/29/2015)   Cancer (HCC)    skin cancer   Carotid artery stenosis    Carotid US  11/17: 1-39% bilateral ICA stenosis. F/u prn   Cervical stenosis of spinal canal    fusion 2006   CHF (congestive heart failure) (HCC)    "secondry to [facility]"   Closed left pilon fracture    COLONIC POLYPS, HX OF    Complication of anesthesia    "takes a lot to put him under and has woken up during surgery" Difficult to wake up . PTSD; "does not metabolize RX well"that he gets "violent", per pt.    Coronary artery disease    Depression    Difficult intubation 03/30/2015   Glidescop and 4 used 10/28/22, 04/07/23 due to history   DIVERTICULOSIS, COLON    Dyspnea    Dysrhythmia    2 bouts of afib post op and at home but corrected with medication.   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 Marven Slimmer   PTSD (post-traumatic stress disorder)    PTSD (post-traumatic stress disorder)    Sleep apnea    On a CPAP   Past Surgical History:  Procedure Laterality Date   ANKLE FUSION Left 03/17/2017   Procedure: FUSION  PILON FRACTURE WITH BONE GRAFTING;  Surgeon: Laneta Pintos, MD;  Location: [facility] OR;  Service: Orthopedics;  Laterality: Left;   ANTERIOR FUSION CERVICAL SPINE  2002   AORTIC VALVE REPLACEMENT N/A 03/30/2015   Procedure:  AORTIC VALVE REPLACEMENT (AVR);  Surgeon: Norita Beauvais, MD;  Location: [facility] OR;  Service: Open Heart Surgery;  Laterality: N/A;   ATRIAL FIBRILLATION ABLATION N/A 04/07/2023   Procedure: ATRIAL FIBRILLATION ABLATION;  Surgeon: Boyce Byes, MD;  Location: [facility] INVASIVE CV LAB;  Service: Cardiovascular;  Laterality: N/A;   CARDIAC CATHETERIZATION N/A 03/26/2015   Procedure: Left Heart Cath and Coronary Angiography;  Surgeon: Arleen Lacer, MD;  Location: [facility] INVASIVE CV LAB;  Service: Cardiovascular;  Laterality: N/A;   CARDIAC VALVE REPLACEMENT     CHOLECYSTECTOMY N/A 10/28/2022   Procedure: LAPAROSCOPIC CHOLECYSTECTOMY WITH INTRAOPERATIVE CHOLANGIOGRAM;  Surgeon: Jacolyn Matar, MD;  Location: [facility] ORS;  Service: General;  Laterality: N/A;   COLONOSCOPY W/ POLYPECTOMY     CORONARY ARTERY BYPASS GRAFT N/A 03/30/2015   Procedure: CORONARY ARTERY BYPASS GRAFTING (CABG) x four, using left internal mammary artery and  left    leg greater saphenous vein harvested endoscopically ;  Surgeon: Norita Beauvais, MD;  Location: [facility] OR;  Service: Open Heart Surgery;  Laterality: N/A;   CORONARY/GRAFT ANGIOGRAPHY N/A 12/03/2021   Procedure: CORONARY/GRAFT ANGIOGRAPHY;  Surgeon: Swaziland, [name], MD;  Location: [facility] INVASIVE CV LAB;  Service: Cardiovascular;  Laterality: N/A;   CYSTOSCOPY/URETEROSCOPY/HOLMIUM LASER/STENT PLACEMENT Bilateral 11/07/2016   Procedure: CYSTOSCOPY/URETEROSCOPY/HOLMIUM LASER/STENT PLACEMENT;  Surgeon: Bart Born, MD;  Location: [facility] ORS;  Service: Urology;  Laterality: Bilateral;   CYSTOSCOPY/URETEROSCOPY/HOLMIUM LASER/STENT PLACEMENT Bilateral 11/23/2016   Procedure: CYSTOSCOPY/BILATERAL URETEROSCOPY/HOLMIUM LASER/STENT EXCHANGE;  Surgeon: Bart Born, MD;  Location: [facility] ORS;  Service: Urology;  Laterality: Bilateral;  NEEDS 90 MIN    EXTERNAL FIXATION LEG Left 02/22/2017   Procedure: EXTERNAL FIXATION LEFT ANKLE;  Surgeon: Saundra Curl, MD;  Location: [facility] OR;  Service: Orthopedics;  Laterality: Left;   HEMORRHOID BANDING  1970s   INGUINAL HERNIA REPAIR Left 1962   KIDNEY STONE SURGERY  1986   "cut me open"   LEFT ATRIAL APPENDAGE OCCLUSION N/A 07/13/2023   Procedure: LEFT ATRIAL APPENDAGE OCCLUSION;  Surgeon: Boyce Byes, MD;  Location: [facility] INVASIVE CV LAB;  Service: Cardiovascular;  Laterality: N/A;   LEFT HEART CATH AND CORS/GRAFTS ANGIOGRAPHY N/A 10/25/2018   Procedure: LEFT HEART CATH AND CORS/GRAFTS ANGIOGRAPHY;  Surgeon: Arleen Lacer, MD;  Location: [facility] INVASIVE CV LAB;  Service: Cardiovascular;  Laterality: N/A;   LEFT HEART CATH AND CORS/GRAFTS ANGIOGRAPHY N/A 12/03/2021   Procedure: LEFT HEART CATH AND CORS/GRAFTS ANGIOGRAPHY;  Surgeon: Swaziland, [name], MD;  Location: [facility] INVASIVE CV LAB;  Service: Cardiovascular;  Laterality: N/A;   POSTERIOR FUSION CERVICAL SPINE  2006   TEE WITHOUT CARDIOVERSION N/A 03/30/2015   Procedure: TRANSESOPHAGEAL ECHOCARDIOGRAM (TEE);   Surgeon: Norita Beauvais, MD;  Location: [facility] OR;  Service: Open Heart Surgery;  Laterality: N/A;   TESTICLE TORSION REDUCTION Right 1960   TOE FUSION Left 1985 & 2004   "great toe" and removal   TONSILLECTOMY     TOTAL HIP ARTHROPLASTY Right 10/27/2015   Procedure: RIGHT TOTAL HIP ARTHROPLASTY ANTERIOR APPROACH and steroid injection right foot;  Surgeon: Arnie Lao, MD;  Location: [facility] OR;  Service: Orthopedics;  Laterality: Right;   TOTAL HIP ARTHROPLASTY Left 08/13/2019   TOTAL HIP ARTHROPLASTY Left 08/13/2019   Procedure: LEFT TOTAL HIP ARTHROPLASTY ANTERIOR APPROACH;  Surgeon: Arnie Lao, MD;  Location: [facility] OR;  Service: Orthopedics;  Laterality: Left;   TRANSESOPHAGEAL ECHOCARDIOGRAM (CATH LAB) N/A 07/13/2023   Procedure: TRANSESOPHAGEAL ECHOCARDIOGRAM;  Surgeon: Boyce Byes, MD;  Location:  [facility] INVASIVE CV LAB;  Service: Cardiovascular;  Laterality: N/A;   Patient Active Problem List   Diagnosis Date Noted   Bursitis of right hip 08/12/2023   Hyperglycemia due to type 2 diabetes mellitus (HCC) 08/09/2023   Atrial fibrillation (HCC) 07/13/2023   Presence of Watchman left atrial appendage closure device 07/13/2023   Encounter for well adult exam with abnormal findings 06/28/2023   Cough 05/20/2023   Smoker 12/27/2022   Acute pain 12/22/2022   Cervical spondylosis without myelopathy 12/22/2022   Cervicogenic headache 12/22/2022   Medication overuse headache 12/22/2022   Hyperlipidemia 12/22/2022   Idiopathic peripheral neuropathy 12/22/2022   Prediabetes 12/22/2022   Cervicalgia 10/19/2022   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 10/14/2022   Right inguinal hernia 10/05/2022   Age-related nuclear cataract, bilateral 09/23/2022   Cysts of right upper eyelid 09/23/2022   Other specified disorders of eyelid 09/23/2022   Exposure to potentially hazardous substance 09/23/2022   Atrial flutter (HCC) 09/15/2022   Skin lesion 06/28/2022   Pain of left scapula  04/20/2022   Unstable angina (HCC) 12/02/2021   Intractable chronic migraine without aura and without status migrainosus 11/11/2021   Vitamin D  deficiency 06/13/2021   Encounter for fitting and adjustment of hearing aid 11/26/2020   Encounter for fitting and adjustment of spectacles and contact lenses 11/26/2020   DNR (do not resuscitate) discussion 11/26/2020   Aortic atherosclerosis (HCC) 11/26/2020   Allergic rhinitis 10/05/2020   Sensorineural hearing loss, bilateral 09/29/2020   Hallux rigidus 09/23/2020   Retinoschisis 09/23/2020   RUQ pain 06/10/2020   History of lacunar cerebrovascular accident 04/05/2020   Chronic right-sided headache 03/16/2020   Ankle pain 01/22/2020   Hypothyroidism 11/26/2019   Epididymitis 08/28/2019   Unilateral primary osteoarthritis, left hip 08/13/2019   Status post total replacement of left hip 08/13/2019   Abdominal wall hernia 11/02/2018   Progressive angina (HCC) 10/25/2018   DOE (dyspnea on exertion) 10/25/2018   Abscess of right buttock 11/27/2017   Pilon fracture 03/17/2017   Pilon fracture of left tibia 03/17/2017   At risk for adverse drug reaction 02/28/2017   Closed fracture dislocation of ankle joint, left, with delayed healing, subsequent encounter 02/22/2017   Renal stone 10/26/2016   Gross hematuria 10/26/2016   Bladder neck obstruction 10/26/2016   External hemorrhoid, thrombosed 06/10/2016   Internal hemorrhoids 06/10/2016   Lumbar back pain 05/19/2016   Osteoarthritis of right hip 10/27/2015   Chronic chest pain 09/22/2015   Acute gouty arthritis 06/11/2015   Coronary artery disease involving native coronary artery with angina pectoris (HCC) 06/11/2015   Chronic diastolic CHF (congestive heart failure) (HCC) 06/11/2015   H/O tissue AVR Oct 2016 06/10/2015   Paroxysmal atrial fibrillation (HCC) 06/10/2015   Dyslipidemia 06/10/2015   S/P CABG x 4-Oct 2016 03/30/2015   H/O NSTEMI-Oct 2016 03/27/2015   Poor drug metabolizer  due to cytochrome p450 CYP2D6 variant (HCC)    Depression 09/08/2014   GERD (gastroesophageal reflux disease)    Essential tremor 03/31/2014   Musculoskeletal pain 01/01/2014   Spondylolisthesis at L4-L5 level 01/01/2014   Sinus bradycardia 12/31/2013   Greater trochanteric bursitis of right hip 12/04/2013   OSA on CPAP    Diet-controlled diabetes mellitus (HCC) 07/08/2009   History of colonic polyps 12/31/2008   GOUT 12/22/2008   Essential hypertension 12/22/2008   Diverticulosis of colon 12/22/2008   NEPHROLITHIASIS, HX OF 12/22/2008    PCP: Roslyn Coombe, MD    REFERRING PROVIDER: Persons, Norma Beckers, [place]  REFERRING DIAG:  Diagnosis  M25.551 (ICD-10-CM) - Pain in right hip    THERAPY DIAG:  Right hip pain  Muscle weakness (generalized)  Difficulty in walking, not elsewhere classified  Other low back pain  Rationale for Evaluation and Treatment: Rehabilitation  ONSET DATE: 3-4 months progressively getting worse  SUBJECTIVE:    SUBJECTIVE STATEMENT Chuck reporting pain of 5/10 pain in his Rt hip. Pt and wife reporting pt has been seen by the pain clinic for his neck pain. Pt's wife stated they are considering a nerve block.   PERTINENT HISTORY: THA on left 08/13/2019, THA on Rt 10/27/2015, A-fib, Cardiac valve replacement, ankle fusion 03/17/2017  PAIN:  NPRS scale: 5/10 today Pain location: Rt hip Pain description: achy, throbbing, soreness, stiffness Aggravating factors: standing, transfers, walking Relieving factors: resting, changing positions helps some but doesn't completely resolve  PRECAUTIONS: none  WEIGHT BEARING RESTRICTIONS: No  FALLS:  Has patient fallen in last 6 months? No  LIVING ENVIRONMENT: Lives with: lives with their family and lives with their spouse Lives in: House/apartment  Has following equipment at home: Single point cane, Environmental consultant - 2 wheeled, and shower chair  OCCUPATION: retired  PLOF: Independent  PATIENT GOALS: walk  to my grandchild's graduation.   Next MD visit:   OBJECTIVE:   PATIENT SURVEYS:  Patient-Specific Activity Scoring Scheme  "0" represents "unable to perform." "10" represents "able to perform at prior level. 0 1 2 3 4 5 6 7 8 9  10 (Date and Score)   Activity Eval  08/28/23    1. walking  5    2. dressing  7    3. Preparing a meal 4   4.    5.    Score 16/3= 5.3    Total score = sum of the activity scores/number of activities Minimum detectable change (90%CI) for average score = 2 points Minimum detectable change (90%CI) for single activity score = 3 points  COGNITION: Overall cognitive status: WFL    SENSATION: Number and tingling down Rt LE into     POSTURE:  rounded shoulders, forward head, and decreased lumbar lordosis  PALPATION: TTP: Rt vastus intermedius  LOWER EXTREMITY ROM:   ROM Right Eval Active supine Left Eval Active supine  Hip flexion 95 104  Hip extension    Hip abduction    Hip adduction    Hip internal rotation    Hip external rotation    Knee flexion 125 112  Knee extension -6 -6  Ankle dorsiflexion    Ankle plantarflexion    Ankle inversion    Ankle eversion     (Blank rows = not tested)  LOWER EXTREMITY MMT:  MMT Right eval Left eval Right 09/19/2023 Left 09/19/2023  Hip flexion 5 5 4+/5 5/5  Hip extension      Hip abduction 3 3    Hip adduction 3 3    Hip internal rotation 3 3 4/5 4/5  Hip external rotation 3 3 4/5 4/5  Knee flexion      Knee extension      Ankle dorsiflexion      Ankle plantarflexion      Ankle inversion      Ankle eversion       (Blank rows = not tested)    FUNCTIONAL TESTS:  08/28/23: 5 time sit to stand: 27.6 seconds c UE support 09/27/23: 5 time sit to stand:13.3 seconds c UE support  5 time sit to stand:13.6 seconds c no UE support  GAIT: 08/28/23 Distance walked: 30 feet clinic distances level surfaces Assistive device utilized: Single point cane Level of assistance: Modified  independence Comments: forward flexed posture, increased bilateral  knee flexion during stance phase, decreased heel to toe strike bilaterally and decreased Dorsiflexion and mobility in left ankle.                                                                                                                                                                        TODAY'S TREATMENT                                                                          DATE: 09/27/2023 Therex:  Nustep: 8 minutes, level 5 UE/LE Standing hip extension 2 x 10 bil c UE support Standing hip flexor stretch in lunge position x 2 bil LE's holding 30 sec with UE support Manual:  Sidel-ying Passive ROM: hip extension, Hip flexion, quad  stretches all x 3 holding 30 seconds TherActivites:  Step taps on 6 inch step with heelx  2 x 20 Step ups on 4 inch step with single UE support 2 x 10  Sit to stand 3 x 5 (see best times from above for 5 time sit to stand) Leg Press: bil LE's 50#  2 x 10, single LE: 25# x 10 bil LE    TODAY'S TREATMENT                                                                          DATE: 09/21/2023 Yoga Bridge 2 sets of 10 for 5 seconds, slow eccentric Side lie clamshells with Black Thera-Band 2 sets of 10 for 3 seconds and slow eccentrics Reviewed and made slight modifications to HEP  Functional Activities: Sit to stand with no hands, lumbar extension and scapular retraction at the top, slow eccentrics Pull to chest Blue 20 x 3 seconds  Neuromuscular re-education: Tandem balance: 5 x 20 seconds (rest and cues for posture)   DATE:  09/19/2023 Therex: Nustep Lvl 5 10 mins UE/LE Supine hooklying Lumbar trunk rotation 30 sec x 3 bilateral  Bridges with ball squeeze x 10 holding 5 sec Supine hooklying bridge 5 sec hold 2  x 10  Supine hooklying clam shell green band isometric hold contralateral leg x 20   Additional time in review of exercise for HEP.     TherActivity: (to improve  functional movements including transfers, squatting and bed mobility) Review of supine to sit to supine log rolling (pt require cues and practice) Sit to stand sit 20 inch table height s UE assist.       PATIENT EDUCATION:  Education details: HEP, POC Person educated: Patient Education method: Explanation, Demonstration, Verbal cues, and Handouts Education comprehension: verbalized understanding, returned demonstration, and verbal cues required  HOME EXERCISE PROGRAM: Access Code: [email or web address] URL: [email or web address] Date: 09/21/2023 Prepared by: Terral Ferrari  Exercises - Supine Lower Trunk Rotation  - 1 x daily - 1 x weekly - 4 reps - 20 seconds hold - Supine March  - 2 x daily - 1 x weekly - 20 reps - Supine Hip Adduction Isometric with Ball  - 2 x daily - 1 x weekly - 2 sets - 10 reps - 5 seconds hold - Clamshell with Resistance  - 2 x daily - 7 x weekly - 2 sets - 10 reps - 3 seconds hold - Yoga Bridge  - 2 x daily - 7 x weekly - 2 sets - 10 reps - 5 seconds hold - Tandem Stance  - 1 x daily - 7 x weekly - 1 sets - 5 reps - 20 second hold - Sit to Stand Without Arm Support  - 2 x daily - 7 x weekly - 1 sets - 5 reps - Standing Lumbar Extension at Wall - Forearms  - 5 x daily - 7 x weekly - 1 sets - 5 reps - 3 seconds hold - Standing Scapular Retraction  - 5 x daily - 7 x weekly - 1 sets - 5 reps - 5 second hold  ASSESSMENT:  CLINICAL IMPRESSION: Chuck reporting 5/10 pain upon arrival. Pt tolerating all exercises well with good response to increases strengthening. Continue skilled PT toward goals set.   OBJECTIVE IMPAIRMENTS: Abnormal gait, decreased balance, decreased mobility, difficulty walking, decreased ROM, decreased strength, impaired flexibility, and pain.   ACTIVITY LIMITATIONS: lifting, bending, sitting, standing, squatting, sleeping, stairs, transfers, and dressing  PARTICIPATION LIMITATIONS: meal prep and community activity  PERSONAL FACTORS: 3+  comorbidities: see PMH above  are also affecting patient's functional outcome.   REHAB POTENTIAL: Good  CLINICAL DECISION MAKING: Evolving/moderate complexity  EVALUATION COMPLEXITY: Moderate   GOALS: Goals reviewed with patient? Yes  SHORT TERM GOALS: (target date for Short term goals are 3 weeks 09/18/2023)   1.  Patient will demonstrate independent use of home exercise program to maintain progress from in clinic treatments.  Goal status: Partially met 09/21/2023 (cues still needed)  LONG TERM GOALS: (target dates for all long term goals are 10 weeks  11/10/2023 )   1. Patient will demonstrate/report pain at worst less than or equal to 2/10 to facilitate minimal limitation in daily activity secondary to pain symptoms.  Goal status: New   2. Patient will demonstrate independent use of home exercise program to facilitate ability to maintain/progress functional gains from skilled physical therapy services.  Goal status: New   3. Patient will demonstrate Patient specific functional scale avg > or = 7.3 to indicate reduced disability due to condition.   Goal status: New   4.  Patient will demonstrate bil LE MMT >/= 4/5 throughout to faciltiate usual transfers, stairs, squatting at [facility] for daily life.   Goal status:  New   5.  Patient will demonstrate 5 time sit to stand </= 15 seconds c/s UE support.  Goal status: New   6.  Pt will be able to walk >/= 500 feet on community surfaces with LRAD.  Goal status: New      PLAN:  PT FREQUENCY: 1-2x/week  PT DURATION: 10 weeks  PLANNED INTERVENTIONS: Can include 16109- PT Re-evaluation, 97110-Therapeutic exercises, 97530- Therapeutic activity, 97112- Neuromuscular re-education, [phone]- Self Care, 97140- Manual therapy, [phone]- Gait training, [phone]- Orthotic Fit/training, [phone]- Canalith repositioning, [id number]- Aquatic Therapy, [phone]- Electrical stimulation (unattended), [phone]- Electrical stimulation (manual), [id number] Physical performance  testing, 97016- Vasopneumatic device, [id number]- Ultrasound, [id number]- Traction (mechanical), [id number]- Ionotophoresis 4mg /ml Dexamethasone , Patient/Family education, Balance training, Stair training, Taping, Dry Needling, Joint mobilization, Joint manipulation, Spinal manipulation, Spinal mobilization, Scar mobilization, Vestibular training, Visual/preceptual remediation/compensation, DME instructions, Cryotherapy, and Moist heat.  All performed as medically necessary.  All included unless contraindicated  PLAN FOR NEXT SESSION:Low back, hip abductors and general lower extremity strengthening, along with activities to improve weightbearing endurance.  Joli Neas PT, MPT 09/27/23  11:28 AM     Date of referral: 3/725 Referring provider: Persons, Norma Beckers, [place]  Referring diagnosis? REFERRING DIAG:  Diagnosis  M25.551 (ICD-10-CM) - Pain in right hip   Treatment diagnosis? (if different than referring diagnosis) M25.551, M62.81, R26.2, M54.59  What was this (referring dx) caused by? Ongoing Issue  Lonne Roan of Condition: Initial Onset (within last 3 months)   Laterality: Both  Current Functional Measure Score: Other 5.3 : pt specific activity score  Objective measurements identify impairments when they are compared to normal values, the uninvolved extremity, and prior level of function.  [x]  Yes  []  No  Objective assessment of functional ability: Moderate functional limitations   Briefly describe symptoms: Rt and left hip pain, c Rt being worse  How did symptoms start: over time and worsening over the last 3-4 months. Pt c history of bilateral hip replacements.   Average pain intensity:  Last 24 hours: 8/10 mostly Rt hip  Past week: can reach 8/10 at times, 4/10 at the lowest during the last week .   How often does the pt experience symptoms? Constantly  How much have the symptoms interfered with usual daily activities? Quite a bit  How has condition changed since care began at this  facility? NA - initial visit  In general, how is the patients overall health? Fair   BACK PAIN (STarT Back Screening Tool) Has pain spread down the leg(s) at some time in the last 2 weeks? yes Has there been pain in the shoulder or neck at some time in the last 2 weeks? Yes, Rt shoulder Has the pt only walked short distances because of back pain? yes Has patient dressed more slowly because of back pain in the past 2 weeks? no Does patient think it's not safe for a person with this condition to be physically active? no Does patient have worrying thoughts a lot of the time? no Does patient feel back pain is terrible and will never get any better? no Has patient stopped enjoying things they usually enjoy? yes

## 2023-09-28 DIAGNOSIS — L821 Other seborrheic keratosis: Secondary | ICD-10-CM | POA: Diagnosis not present

## 2023-09-28 DIAGNOSIS — B354 Tinea corporis: Secondary | ICD-10-CM | POA: Diagnosis not present

## 2023-09-28 DIAGNOSIS — D225 Melanocytic nevi of trunk: Secondary | ICD-10-CM | POA: Diagnosis not present

## 2023-10-02 ENCOUNTER — Encounter: Payer: Self-pay | Admitting: Physical Therapy

## 2023-10-02 ENCOUNTER — Ambulatory Visit: Admitting: Physical Therapy

## 2023-10-02 DIAGNOSIS — R262 Difficulty in walking, not elsewhere classified: Secondary | ICD-10-CM | POA: Diagnosis not present

## 2023-10-02 DIAGNOSIS — M25551 Pain in right hip: Secondary | ICD-10-CM

## 2023-10-02 DIAGNOSIS — H25813 Combined forms of age-related cataract, bilateral: Secondary | ICD-10-CM | POA: Diagnosis not present

## 2023-10-02 DIAGNOSIS — H43813 Vitreous degeneration, bilateral: Secondary | ICD-10-CM | POA: Diagnosis not present

## 2023-10-02 DIAGNOSIS — M5459 Other low back pain: Secondary | ICD-10-CM | POA: Diagnosis not present

## 2023-10-02 DIAGNOSIS — D3131 Benign neoplasm of right choroid: Secondary | ICD-10-CM | POA: Diagnosis not present

## 2023-10-02 DIAGNOSIS — M6281 Muscle weakness (generalized): Secondary | ICD-10-CM | POA: Diagnosis not present

## 2023-10-02 DIAGNOSIS — H353131 Nonexudative age-related macular degeneration, bilateral, early dry stage: Secondary | ICD-10-CM | POA: Diagnosis not present

## 2023-10-02 NOTE — Therapy (Signed)
 OUTPATIENT PHYSICAL THERAPY LOWER EXTREMITY TREATMENT  Patient Name: Troy Banks MRN: [id number] DOB:1941/03/14, 83 y.o., male Today's Date: 10/02/2023  END OF SESSION:  PT End of Session - 10/02/23 1312     Visit Number 6    Number of Visits 20    Date for PT Re-Evaluation 11/10/23    Authorization Type [employer] Medicare    Progress Note Due on Visit 10    PT Start Time 1300    PT Stop Time 1340    PT Time Calculation (min) 40 min    Activity Tolerance Patient tolerated treatment well;No increased pain;Patient limited by fatigue    Behavior During Therapy [facility] for tasks assessed/performed              Past Medical History:  Diagnosis Date   Allergy    Anxiety    PTSD   Aortic stenosis, moderate 03/27/2015   post AVR echo Echo 11/16: Mild LVH, EF 55-60%, normal wall motion, grade 1 diastolic dysfunction, bioprosthetic AVR okay (mean gradient 18 mmHg) with trivial AI, mild LAE, atrial septal aneurysm, small effusion    Arthritis    "qwhere" (10/29/2015)   Cancer (HCC)    skin cancer   Carotid artery stenosis    Carotid US  11/17: 1-39% bilateral ICA stenosis. F/u prn   Cervical stenosis of spinal canal    fusion 2006   CHF (congestive heart failure) (HCC)    "secondry to [facility]"   Closed left pilon fracture    COLONIC POLYPS, HX OF    Complication of anesthesia    "takes a lot to put him under and has woken up during surgery" Difficult to wake up . PTSD; "does not metabolize RX well"that he gets "violent", per pt.    Coronary artery disease    Depression    Difficult intubation 03/30/2015   Glidescop and 4 used 10/28/22, 04/07/23 due to history   DIVERTICULOSIS, COLON    Dyspnea    Dysrhythmia    2 bouts of afib post op and at home but corrected with medication.   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 Marven Slimmer   PTSD (post-traumatic stress disorder)    PTSD (post-traumatic stress disorder)    Sleep apnea    On a CPAP   Past Surgical History:  Procedure Laterality Date   ANKLE FUSION Left 03/17/2017   Procedure: FUSION  PILON FRACTURE WITH BONE GRAFTING;  Surgeon: Laneta Pintos, MD;  Location: [facility] OR;  Service: Orthopedics;  Laterality: Left;   ANTERIOR FUSION CERVICAL SPINE  2002   AORTIC VALVE REPLACEMENT N/A 03/30/2015   Procedure:  AORTIC VALVE REPLACEMENT (AVR);  Surgeon: Norita Beauvais, MD;  Location: [facility] OR;  Service: Open Heart Surgery;  Laterality: N/A;   ATRIAL FIBRILLATION ABLATION N/A 04/07/2023   Procedure: ATRIAL FIBRILLATION ABLATION;  Surgeon: Boyce Byes, MD;  Location: [facility] INVASIVE CV LAB;  Service: Cardiovascular;  Laterality: N/A;   CARDIAC CATHETERIZATION N/A 03/26/2015   Procedure: Left Heart Cath and Coronary Angiography;  Surgeon: Arleen Lacer, MD;  Location[facility] INVASIVE CV LAB;  Service: Cardiovascular;  Laterality: N/A;   CARDIAC VALVE REPLACEMENT     CHOLECYSTECTOMY N/A 10/28/2022   Procedure: LAPAROSCOPIC CHOLECYSTECTOMY WITH INTRAOPERATIVE CHOLANGIOGRAM;  Surgeon: Jacolyn Matar, MD;  Location: [facility] ORS;  Service: General;  Laterality: N/A;   COLONOSCOPY W/ POLYPECTOMY     CORONARY ARTERY BYPASS GRAFT N/A 03/30/2015   Procedure: CORONARY ARTERY BYPASS GRAFTING (CABG) x four, using left internal mammary artery and  left    leg greater saphenous vein harvested endoscopically ;  Surgeon: Norita Beauvais, MD;  Location: [facility] OR;  Service: Open Heart Surgery;  Laterality: N/A;   CORONARY/GRAFT ANGIOGRAPHY N/A 12/03/2021   Procedure: CORONARY/GRAFT ANGIOGRAPHY;  Surgeon: Swaziland, [name], MD;  Location: [facility] INVASIVE CV LAB;  Service: Cardiovascular;  Laterality: N/A;   CYSTOSCOPY/URETEROSCOPY/HOLMIUM LASER/STENT PLACEMENT Bilateral 11/07/2016   Procedure: CYSTOSCOPY/URETEROSCOPY/HOLMIUM LASER/STENT PLACEMENT;  Surgeon: Bart Born, MD;  Location: [facility] ORS;  Service: Urology;  Laterality: Bilateral;   CYSTOSCOPY/URETEROSCOPY/HOLMIUM LASER/STENT PLACEMENT Bilateral 11/23/2016   Procedure: CYSTOSCOPY/BILATERAL URETEROSCOPY/HOLMIUM LASER/STENT EXCHANGE;  Surgeon: Bart Born, MD;  Location: [facility] ORS;  Service: Urology;  Laterality: Bilateral;  NEEDS 90 MIN    EXTERNAL FIXATION LEG Left 02/22/2017   Procedure: EXTERNAL FIXATION LEFT ANKLE;  Surgeon: Saundra Curl, MD;  Location: [facility] OR;  Service: Orthopedics;  Laterality: Left;   HEMORRHOID BANDING  1970s   INGUINAL HERNIA REPAIR Left 1962   KIDNEY STONE SURGERY  1986   "cut me open"   LEFT ATRIAL APPENDAGE OCCLUSION N/A 07/13/2023   Procedure: LEFT ATRIAL APPENDAGE OCCLUSION;  Surgeon: Boyce Byes, MD;  Location: [facility] INVASIVE CV LAB;  Service: Cardiovascular;  Laterality: N/A;   LEFT HEART CATH AND CORS/GRAFTS ANGIOGRAPHY N/A 10/25/2018   Procedure: LEFT HEART CATH AND CORS/GRAFTS ANGIOGRAPHY;  Surgeon: Arleen Lacer, MD;  Location: [facility] INVASIVE CV LAB;  Service: Cardiovascular;  Laterality: N/A;   LEFT HEART CATH AND CORS/GRAFTS ANGIOGRAPHY N/A 12/03/2021   Procedure: LEFT HEART CATH AND CORS/GRAFTS ANGIOGRAPHY;  Surgeon: Swaziland, [name], MD;  Location: [facility] INVASIVE CV LAB;  Service: Cardiovascular;  Laterality: N/A;   POSTERIOR FUSION CERVICAL SPINE  2006   TEE WITHOUT CARDIOVERSION N/A 03/30/2015   Procedure: TRANSESOPHAGEAL ECHOCARDIOGRAM (TEE);   Surgeon: Norita Beauvais, MD;  Location: [facility] OR;  Service: Open Heart Surgery;  Laterality: N/A;   TESTICLE TORSION REDUCTION Right 1960   TOE FUSION Left 1985 & 2004   "great toe" and removal   TONSILLECTOMY     TOTAL HIP ARTHROPLASTY Right 10/27/2015   Procedure: RIGHT TOTAL HIP ARTHROPLASTY ANTERIOR APPROACH and steroid injection right foot;  Surgeon: Arnie Lao, MD;  Location: [facility] OR;  Service: Orthopedics;  Laterality: Right;   TOTAL HIP ARTHROPLASTY Left 08/13/2019   TOTAL HIP ARTHROPLASTY Left 08/13/2019   Procedure: LEFT TOTAL HIP ARTHROPLASTY ANTERIOR APPROACH;  Surgeon: Arnie Lao, MD;  Location: [facility] OR;  Service: Orthopedics;  Laterality: Left;   TRANSESOPHAGEAL ECHOCARDIOGRAM (CATH LAB) N/A 07/13/2023   Procedure: TRANSESOPHAGEAL ECHOCARDIOGRAM;  Surgeon: Boyce Byes, MD;  Location:  [facility] INVASIVE CV LAB;  Service: Cardiovascular;  Laterality: N/A;   Patient Active Problem List   Diagnosis Date Noted   Bursitis of right hip 08/12/2023   Hyperglycemia due to type 2 diabetes mellitus (HCC) 08/09/2023   Atrial fibrillation (HCC) 07/13/2023   Presence of Watchman left atrial appendage closure device 07/13/2023   Encounter for well adult exam with abnormal findings 06/28/2023   Cough 05/20/2023   Smoker 12/27/2022   Acute pain 12/22/2022   Cervical spondylosis without myelopathy 12/22/2022   Cervicogenic headache 12/22/2022   Medication overuse headache 12/22/2022   Hyperlipidemia 12/22/2022   Idiopathic peripheral neuropathy 12/22/2022   Prediabetes 12/22/2022   Cervicalgia 10/19/2022   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 10/14/2022   Right inguinal hernia 10/05/2022   Age-related nuclear cataract, bilateral 09/23/2022   Cysts of right upper eyelid 09/23/2022   Other specified disorders of eyelid 09/23/2022   Exposure to potentially hazardous substance 09/23/2022   Atrial flutter (HCC) 09/15/2022   Skin lesion 06/28/2022   Pain of left scapula  04/20/2022   Unstable angina (HCC) 12/02/2021   Intractable chronic migraine without aura and without status migrainosus 11/11/2021   Vitamin D  deficiency 06/13/2021   Encounter for fitting and adjustment of hearing aid 11/26/2020   Encounter for fitting and adjustment of spectacles and contact lenses 11/26/2020   DNR (do not resuscitate) discussion 11/26/2020   Aortic atherosclerosis (HCC) 11/26/2020   Allergic rhinitis 10/05/2020   Sensorineural hearing loss, bilateral 09/29/2020   Hallux rigidus 09/23/2020   Retinoschisis 09/23/2020   RUQ pain 06/10/2020   History of lacunar cerebrovascular accident 04/05/2020   Chronic right-sided headache 03/16/2020   Ankle pain 01/22/2020   Hypothyroidism 11/26/2019   Epididymitis 08/28/2019   Unilateral primary osteoarthritis, left hip 08/13/2019   Status post total replacement of left hip 08/13/2019   Abdominal wall hernia 11/02/2018   Progressive angina (HCC) 10/25/2018   DOE (dyspnea on exertion) 10/25/2018   Abscess of right buttock 11/27/2017   Pilon fracture 03/17/2017   Pilon fracture of left tibia 03/17/2017   At risk for adverse drug reaction 02/28/2017   Closed fracture dislocation of ankle joint, left, with delayed healing, subsequent encounter 02/22/2017   Renal stone 10/26/2016   Gross hematuria 10/26/2016   Bladder neck obstruction 10/26/2016   External hemorrhoid, thrombosed 06/10/2016   Internal hemorrhoids 06/10/2016   Lumbar back pain 05/19/2016   Osteoarthritis of right hip 10/27/2015   Chronic chest pain 09/22/2015   Acute gouty arthritis 06/11/2015   Coronary artery disease involving native coronary artery with angina pectoris (HCC) 06/11/2015   Chronic diastolic CHF (congestive heart failure) (HCC) 06/11/2015   H/O tissue AVR Oct 2016 06/10/2015   Paroxysmal atrial fibrillation (HCC) 06/10/2015   Dyslipidemia 06/10/2015   S/P CABG x 4-Oct 2016 03/30/2015   H/O NSTEMI-Oct 2016 03/27/2015   Poor drug metabolizer  due to cytochrome p450 CYP2D6 variant (HCC)    Depression 09/08/2014   GERD (gastroesophageal reflux disease)    Essential tremor 03/31/2014   Musculoskeletal pain 01/01/2014   Spondylolisthesis at L4-L5 level 01/01/2014   Sinus bradycardia 12/31/2013   Greater trochanteric bursitis of right hip 12/04/2013   OSA on CPAP    Diet-controlled diabetes mellitus (HCC) 07/08/2009   History of colonic polyps 12/31/2008   GOUT 12/22/2008   Essential hypertension 12/22/2008   Diverticulosis of colon 12/22/2008   NEPHROLITHIASIS, HX OF 12/22/2008    PCP: Roslyn Coombe, MD    REFERRING PROVIDER: Persons, Norma Beckers, [place]  REFERRING DIAG:  Diagnosis  M25.551 (ICD-10-CM) - Pain in right hip    THERAPY DIAG:  Right hip pain  Muscle weakness (generalized)  Difficulty in walking, not elsewhere classified  Other low back pain  Rationale for Evaluation and Treatment: Rehabilitation  ONSET DATE: 3-4 months progressively getting worse  SUBJECTIVE:    SUBJECTIVE STATEMENT Pt reporting he walked more this past weekend while at [facility].   PERTINENT HISTORY: THA on left 08/13/2019, THA on Rt 10/27/2015, A-fib, Cardiac valve replacement, ankle fusion 03/17/2017  PAIN:  NPRS scale: 4-5/10 today Pain location: Rt hip Pain description: achy, throbbing, soreness, stiffness Aggravating factors: standing, transfers, walking Relieving factors: resting, changing positions helps some but doesn't completely resolve  PRECAUTIONS: none  WEIGHT BEARING RESTRICTIONS: No  FALLS:  Has patient fallen in last 6 months? No  LIVING ENVIRONMENT: Lives with: lives with their family and lives with their spouse Lives in: House/apartment  Has following equipment at home: Single point cane, Environmental consultant - 2 wheeled, and shower chair  OCCUPATION: retired  PLOF: Independent  PATIENT GOALS: walk to my grandchild's graduation.   Next MD visit:   OBJECTIVE:   PATIENT SURVEYS:  Patient-Specific  Activity Scoring Scheme  "0" represents "unable to perform." "10" represents "able to perform at prior level. 0 1 2 3 4 5 6 7 8 9  10 (Date and Score)   Activity Eval  08/28/23    1. walking  5    2. dressing  7    3. Preparing a meal 4   4.    5.    Score 16/3= 5.3    Total score = sum of the activity scores/number of activities Minimum detectable change (90%CI) for average score = 2 points Minimum detectable change (90%CI) for single activity score = 3 points  COGNITION: Overall cognitive status: WFL    SENSATION: Number and tingling down Rt LE into     POSTURE:  rounded shoulders, forward head, and decreased lumbar lordosis  PALPATION: TTP: Rt vastus intermedius  LOWER EXTREMITY ROM:   ROM Right Eval Active supine Left Eval Active supine  Hip flexion 95 104  Hip extension    Hip abduction    Hip adduction    Hip internal rotation    Hip external rotation    Knee flexion 125 112  Knee extension -6 -6  Ankle dorsiflexion    Ankle plantarflexion    Ankle inversion    Ankle eversion     (Blank rows = not tested)  LOWER EXTREMITY MMT:  MMT Right eval Left eval Right 09/19/2023 Left 09/19/2023  Hip flexion 5 5 4+/5 5/5  Hip extension      Hip abduction 3 3    Hip adduction 3 3    Hip internal rotation 3 3 4/5 4/5  Hip external rotation 3 3 4/5 4/5  Knee flexion      Knee extension      Ankle dorsiflexion      Ankle plantarflexion      Ankle inversion      Ankle eversion       (Blank rows = not tested)    FUNCTIONAL TESTS:  08/28/23: 5 time sit to stand: 27.6 seconds c UE support 09/27/23: 5 time sit to stand:13.3 seconds c UE support  5 time sit to stand:13.6 seconds c no UE support    GAIT: 08/28/23 Distance walked: 30 feet clinic distances level surfaces Assistive device utilized: Single point cane Level of assistance: Modified independence  Comments: forward flexed posture, increased bilateral  knee flexion during stance phase,  decreased heel to toe strike bilaterally and decreased Dorsiflexion and mobility in left ankle.                                                                                                                                                                         TODAY'S TREATMENT                                                                          DATE: 10/02/2023 Therex:  Seated hamstring stretch x 3 bil holidng 30 sec Supine: bridges: x 10, Supine bridges c ball squeeze 2 x 10  Supine SLR 2 x 10 bil LE TherActivites:  Mini squats: x 10 c UE support Sit to stand 3 x 5 (see best times from above for 5 time sit to stand) Leg Press: bil LE's 75#  2 x 10, single LE: 31# x 10 bil LE NMR:  Tandem stance x 2 each LE holding 30 sec no UE support Standing feet together x 2 holding 30 sec c supervision Standing feet together eyes closed x 30 sec c close supervision     TODAY'S TREATMENT                                                                          DATE: 09/27/2023 Therex:  Nustep: 8 minutes, level 5 UE/LE Standing hip extension 2 x 10 bil c UE support Standing hip flexor stretch in lunge position x 2 bil LE's holding 30 sec with UE support Manual:  Sidel-ying Passive ROM: hip extension, Hip flexion, quad  stretches all x 3 holding 30 seconds TherActivites:  Step taps on 6 inch step with heelx  2 x 20 Step ups on 4 inch step with single UE support 2 x 10  Sit to stand 3 x 5 (see best times from above for 5 time sit to stand) Leg Press: bil LE's 50#  2 x 10, single LE: 25# x 10 bil LE    TODAY'S TREATMENT  DATE: 09/21/2023 Yoga Bridge 2 sets of 10 for 5 seconds, slow eccentric Side lie clamshells with Black Thera-Band 2 sets of 10 for 3 seconds and slow eccentrics Reviewed and made slight modifications to HEP  Functional Activities: Sit to stand with no hands, lumbar extension and scapular  retraction at the top, slow eccentrics Pull to chest Blue 20 x 3 seconds  Neuromuscular re-education: Tandem balance: 5 x 20 seconds (rest and cues for posture)   DATE:  09/19/2023 Therex: Nustep Lvl 5 10 mins UE/LE Supine hooklying Lumbar trunk rotation 30 sec x 3 bilateral  Bridges with ball squeeze x 10 holding 5 sec Supine hooklying bridge 5 sec hold 2 x 10  Supine hooklying clam shell green band isometric hold contralateral leg x 20   Additional time in review of exercise for HEP.     TherActivity: (to improve functional movements including transfers, squatting and bed mobility) Review of supine to sit to supine log rolling (pt require cues and practice) Sit to stand sit 20 inch table height s UE assist.       PATIENT EDUCATION:  Education details: HEP, POC Person educated: Patient Education method: Explanation, Demonstration, Verbal cues, and Handouts Education comprehension: verbalized understanding, returned demonstration, and verbal cues required  HOME EXERCISE PROGRAM: Access Code: [email or web address] URL: [email or web address] Date: 09/21/2023 Prepared by: Terral Ferrari  Exercises - Supine Lower Trunk Rotation  - 1 x daily - 1 x weekly - 4 reps - 20 seconds hold - Supine March  - 2 x daily - 1 x weekly - 20 reps - Supine Hip Adduction Isometric with Ball  - 2 x daily - 1 x weekly - 2 sets - 10 reps - 5 seconds hold - Clamshell with Resistance  - 2 x daily - 7 x weekly - 2 sets - 10 reps - 3 seconds hold - Yoga Bridge  - 2 x daily - 7 x weekly - 2 sets - 10 reps - 5 seconds hold - Tandem Stance  - 1 x daily - 7 x weekly - 1 sets - 5 reps - 20 second hold - Sit to Stand Without Arm Support  - 2 x daily - 7 x weekly - 1 sets - 5 reps - Standing Lumbar Extension at Wall - Forearms  - 5 x daily - 7 x weekly - 1 sets - 5 reps - 3 seconds hold - Standing Scapular Retraction  - 5 x daily - 7 x weekly - 1 sets - 5 reps - 5 second hold  ASSESSMENT:  CLINICAL  IMPRESSION: Chuck with good response to therapy making improvements in strength gains on Leg Press after a weekend filled of walking pt reporting 4-5/10 pain his left hip. Recommend continued skilled PT interventions.   OBJECTIVE IMPAIRMENTS: Abnormal gait, decreased balance, decreased mobility, difficulty walking, decreased ROM, decreased strength, impaired flexibility, and pain.   ACTIVITY LIMITATIONS: lifting, bending, sitting, standing, squatting, sleeping, stairs, transfers, and dressing  PARTICIPATION LIMITATIONS: meal prep and community activity  PERSONAL FACTORS: 3+ comorbidities: see PMH above  are also affecting patient's functional outcome.   REHAB POTENTIAL: Good  CLINICAL DECISION MAKING: Evolving/moderate complexity  EVALUATION COMPLEXITY: Moderate   GOALS: Goals reviewed with patient? Yes  SHORT TERM GOALS: (target date for Short term goals are 3 weeks 09/18/2023)   1.  Patient will demonstrate independent use of home exercise program to maintain progress from in clinic treatments.  Goal status: Partially met 09/21/2023 (cues still needed)  LONG TERM GOALS: (target  dates for all long term goals are 10 weeks  11/10/2023 )   1. Patient will demonstrate/report pain at worst less than or equal to 2/10 to facilitate minimal limitation in daily activity secondary to pain symptoms.  Goal status: New   2. Patient will demonstrate independent use of home exercise program to facilitate ability to maintain/progress functional gains from skilled physical therapy services.  Goal status: New   3. Patient will demonstrate Patient specific functional scale avg > or = 7.3 to indicate reduced disability due to condition.   Goal status: New   4.  Patient will demonstrate bil LE MMT >/= 4/5 throughout to faciltiate usual transfers, stairs, squatting at [facility] for daily life.   Goal status: New   5.  Patient will demonstrate 5 time sit to stand </= 15 seconds c/s UE support.  Goal  status: New   6.  Pt will be able to walk >/= 500 feet on community surfaces with LRAD.  Goal status: New      PLAN:  PT FREQUENCY: 1-2x/week  PT DURATION: 10 weeks  PLANNED INTERVENTIONS: Can include 16109- PT Re-evaluation, 97110-Therapeutic exercises, 97530- Therapeutic activity, 97112- Neuromuscular re-education, [phone]- Self Care, 97140- Manual therapy, [phone]- Gait training, [phone]- Orthotic Fit/training, [phone]- Canalith repositioning, [id number]- Aquatic Therapy, [phone]- Electrical stimulation (unattended), [phone]- Electrical stimulation (manual), [id number] Physical performance testing, 97016- Vasopneumatic device, [id number]- Ultrasound, [id number]- Traction (mechanical), [id number]- Ionotophoresis 4mg /ml Dexamethasone , Patient/Family education, Balance training, Stair training, Taping, Dry Needling, Joint mobilization, Joint manipulation, Spinal manipulation, Spinal mobilization, Scar mobilization, Vestibular training, Visual/preceptual remediation/compensation, DME instructions, Cryotherapy, and Moist heat.  All performed as medically necessary.  All included unless contraindicated  PLAN FOR NEXT SESSION:Low back, hip abductors and general lower extremity strengthening, along with activities to improve weightbearing endurance.  Joli Neas PT, MPT 10/02/23  2:18 PM     Date of referral: 3/725 Referring provider: Persons, Norma Beckers, [place]  Referring diagnosis? REFERRING DIAG:  Diagnosis  M25.551 (ICD-10-CM) - Pain in right hip   Treatment diagnosis? (if different than referring diagnosis) M25.551, M62.81, R26.2, M54.59  What was this (referring dx) caused by? Ongoing Issue  Lonne Roan of Condition: Initial Onset (within last 3 months)   Laterality: Both  Current Functional Measure Score: Other 5.3 : pt specific activity score  Objective measurements identify impairments when they are compared to normal values, the uninvolved extremity, and prior level of function.  [x]  Yes  []  No  Objective  assessment of functional ability: Moderate functional limitations   Briefly describe symptoms: Rt and left hip pain, c Rt being worse  How did symptoms start: over time and worsening over the last 3-4 months. Pt c history of bilateral hip replacements.   Average pain intensity:  Last 24 hours: 8/10 mostly Rt hip  Past week: can reach 8/10 at times, 4/10 at the lowest during the last week .   How often does the pt experience symptoms? Constantly  How much have the symptoms interfered with usual daily activities? Quite a bit  How has condition changed since care began at this facility? NA - initial visit  In general, how is the patients overall health? Fair   BACK PAIN (STarT Back Screening Tool) Has pain spread down the leg(s) at some time in the last 2 weeks? yes Has there been pain in the shoulder or neck at some time in the last 2 weeks? Yes, Rt shoulder Has the pt only walked short distances because of back pain? yes Has patient dressed  more slowly because of back pain in the past 2 weeks? no Does patient think it's not safe for a person with this condition to be physically active? no Does patient have worrying thoughts a lot of the time? no Does patient feel back pain is terrible and will never get any better? no Has patient stopped enjoying things they usually enjoy? yes

## 2023-10-04 ENCOUNTER — Ambulatory Visit: Admitting: Physical Therapy

## 2023-10-04 ENCOUNTER — Encounter: Payer: Self-pay | Admitting: Physical Therapy

## 2023-10-04 ENCOUNTER — Encounter: Admitting: Physical Therapy

## 2023-10-04 DIAGNOSIS — M6281 Muscle weakness (generalized): Secondary | ICD-10-CM

## 2023-10-04 DIAGNOSIS — M25551 Pain in right hip: Secondary | ICD-10-CM | POA: Diagnosis not present

## 2023-10-04 DIAGNOSIS — M5459 Other low back pain: Secondary | ICD-10-CM | POA: Diagnosis not present

## 2023-10-04 DIAGNOSIS — R262 Difficulty in walking, not elsewhere classified: Secondary | ICD-10-CM

## 2023-10-04 NOTE — Therapy (Signed)
 OUTPATIENT PHYSICAL THERAPY LOWER EXTREMITY TREATMENT  Patient Name: Troy Banks MRN: [id number] DOB:1940-09-15, 83 y.o., male Today's Date: 10/04/2023  END OF SESSION:  PT End of Session - 10/04/23 1141     Visit Number 7    Number of Visits 20    Date for PT Re-Evaluation 11/10/23    Authorization Type [employer] Medicare    Progress Note Due on Visit 10    PT Start Time 1055    PT Stop Time 1140    PT Time Calculation (min) 45 min    Activity Tolerance Patient tolerated treatment well;No increased pain;Patient limited by fatigue    Behavior During Therapy [facility] for tasks assessed/performed               Past Medical History:  Diagnosis Date   Allergy    Anxiety    PTSD   Aortic stenosis, moderate 03/27/2015   post AVR echo Echo 11/16: Mild LVH, EF 55-60%, normal wall motion, grade 1 diastolic dysfunction, bioprosthetic AVR okay (mean gradient 18 mmHg) with trivial AI, mild LAE, atrial septal aneurysm, small effusion    Arthritis    "qwhere" (10/29/2015)   Cancer (HCC)    skin cancer   Carotid artery stenosis    Carotid US  11/17: 1-39% bilateral ICA stenosis. F/u prn   Cervical stenosis of spinal canal    fusion 2006   CHF (congestive heart failure) (HCC)    "secondry to [facility]"   Closed left pilon fracture    COLONIC POLYPS, HX OF    Complication of anesthesia    "takes a lot to put him under and has woken up during surgery" Difficult to wake up . PTSD; "does not metabolize RX well"that he gets "violent", per pt.    Coronary artery disease    Depression    Difficult intubation 03/30/2015   Glidescop and 4 used 10/28/22, 04/07/23 due to history   DIVERTICULOSIS, COLON    Dyspnea    Dysrhythmia    2 bouts of afib post op and at home but corrected with medication.   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 Marven Slimmer   PTSD (post-traumatic stress disorder)    PTSD (post-traumatic stress disorder)    Sleep apnea    On a CPAP   Past Surgical History:  Procedure Laterality Date   ANKLE FUSION Left 03/17/2017   Procedure: FUSION  PILON FRACTURE WITH BONE GRAFTING;  Surgeon: Laneta Pintos, MD;  Location: [facility] OR;  Service: Orthopedics;  Laterality: Left;   ANTERIOR FUSION CERVICAL SPINE  2002   AORTIC VALVE REPLACEMENT N/A 03/30/2015  Procedure: AORTIC VALVE REPLACEMENT (AVR);  Surgeon: Norita Beauvais, MD;  Location: [facility] OR;  Service: Open Heart Surgery;  Laterality: N/A;   ATRIAL FIBRILLATION ABLATION N/A 04/07/2023   Procedure: ATRIAL FIBRILLATION ABLATION;  Surgeon: Boyce Byes, MD;  Location: [facility] INVASIVE CV LAB;  Service: Cardiovascular;  Laterality: N/A;   CARDIAC CATHETERIZATION N/A 03/26/2015   Procedure: Left Heart Cath and Coronary Angiography;  Surgeon: Arleen Lacer, MD;  Location: [facility] INVASIVE CV LAB;  Service: Cardiovascular;  Laterality: N/A;   CARDIAC VALVE REPLACEMENT     CHOLECYSTECTOMY N/A 10/28/2022   Procedure: LAPAROSCOPIC CHOLECYSTECTOMY WITH INTRAOPERATIVE CHOLANGIOGRAM;  Surgeon: Jacolyn Matar, MD;  Location: [facility] ORS;  Service: General;  Laterality: N/A;   COLONOSCOPY W/ POLYPECTOMY     CORONARY ARTERY BYPASS GRAFT N/A 03/30/2015   Procedure: CORONARY ARTERY BYPASS GRAFTING (CABG) x four, using left internal mammary artery  and left    leg greater saphenous vein harvested endoscopically ;  Surgeon: Norita Beauvais, MD;  Location: [facility] OR;  Service: Open Heart Surgery;  Laterality: N/A;   CORONARY/GRAFT ANGIOGRAPHY N/A 12/03/2021   Procedure: CORONARY/GRAFT ANGIOGRAPHY;  Surgeon: Swaziland, [name], MD;  Location: [facility] INVASIVE CV LAB;  Service: Cardiovascular;  Laterality: N/A;   CYSTOSCOPY/URETEROSCOPY/HOLMIUM LASER/STENT PLACEMENT Bilateral 11/07/2016   Procedure: CYSTOSCOPY/URETEROSCOPY/HOLMIUM LASER/STENT PLACEMENT;  Surgeon: Bart Born, MD;  Location: [facility] ORS;  Service: Urology;  Laterality: Bilateral;   CYSTOSCOPY/URETEROSCOPY/HOLMIUM LASER/STENT PLACEMENT Bilateral 11/23/2016   Procedure: CYSTOSCOPY/BILATERAL URETEROSCOPY/HOLMIUM LASER/STENT EXCHANGE;  Surgeon: Bart Born, MD;  Location: [facility] ORS;  Service: Urology;  Laterality: Bilateral;  NEEDS 90 MIN    EXTERNAL FIXATION LEG Left 02/22/2017   Procedure: EXTERNAL FIXATION LEFT ANKLE;  Surgeon: Saundra Curl, MD;  Location: [facility] OR;  Service: Orthopedics;  Laterality: Left;   HEMORRHOID BANDING  1970s   INGUINAL HERNIA REPAIR Left 1962   KIDNEY STONE SURGERY  1986   "cut me open"   LEFT ATRIAL APPENDAGE OCCLUSION N/A 07/13/2023   Procedure: LEFT ATRIAL APPENDAGE OCCLUSION;  Surgeon: Boyce Byes, MD;  Location: [facility] INVASIVE CV LAB;  Service: Cardiovascular;  Laterality: N/A;   LEFT HEART CATH AND CORS/GRAFTS ANGIOGRAPHY N/A 10/25/2018   Procedure: LEFT HEART CATH AND CORS/GRAFTS ANGIOGRAPHY;  Surgeon: Arleen Lacer, MD;  Location: [facility] INVASIVE CV LAB;  Service: Cardiovascular;  Laterality: N/A;   LEFT HEART CATH AND CORS/GRAFTS ANGIOGRAPHY N/A 12/03/2021   Procedure: LEFT HEART CATH AND CORS/GRAFTS ANGIOGRAPHY;  Surgeon: Swaziland, [name], MD;  Location: [facility] INVASIVE CV LAB;  Service: Cardiovascular;  Laterality: N/A;   POSTERIOR FUSION CERVICAL SPINE  2006   TEE WITHOUT CARDIOVERSION N/A 03/30/2015   Procedure: TRANSESOPHAGEAL ECHOCARDIOGRAM (TEE);   Surgeon: Norita Beauvais, MD;  Location: [facility] OR;  Service: Open Heart Surgery;  Laterality: N/A;   TESTICLE TORSION REDUCTION Right 1960   TOE FUSION Left 1985 & 2004   "great toe" and removal   TONSILLECTOMY     TOTAL HIP ARTHROPLASTY Right 10/27/2015   Procedure: RIGHT TOTAL HIP ARTHROPLASTY ANTERIOR APPROACH and steroid injection right foot;  Surgeon: Arnie Lao, MD;  Location: [facility] OR;  Service: Orthopedics;  Laterality: Right;   TOTAL HIP ARTHROPLASTY Left 08/13/2019   TOTAL HIP ARTHROPLASTY Left 08/13/2019   Procedure: LEFT TOTAL HIP ARTHROPLASTY ANTERIOR APPROACH;  Surgeon: Arnie Lao, MD;  Location: [facility] OR;  Service: Orthopedics;  Laterality: Left;   TRANSESOPHAGEAL ECHOCARDIOGRAM (CATH LAB) N/A 07/13/2023   Procedure: TRANSESOPHAGEAL ECHOCARDIOGRAM;  Surgeon: Boyce Byes, MD;  Location: [facility] INVASIVE CV LAB;  Service: Cardiovascular;  Laterality: N/A;   Patient Active Problem List   Diagnosis Date Noted   Bursitis of right hip 08/12/2023   Hyperglycemia due to type 2 diabetes mellitus (HCC) 08/09/2023   Atrial fibrillation (HCC) 07/13/2023   Presence of Watchman left atrial appendage closure device 07/13/2023   Encounter for well adult exam with abnormal findings 06/28/2023   Cough 05/20/2023   Smoker 12/27/2022   Acute pain 12/22/2022   Cervical spondylosis without myelopathy 12/22/2022   Cervicogenic headache 12/22/2022   Medication overuse headache 12/22/2022   Hyperlipidemia 12/22/2022   Idiopathic peripheral neuropathy 12/22/2022   Prediabetes 12/22/2022   Cervicalgia 10/19/2022   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 10/14/2022   Right inguinal hernia 10/05/2022   Age-related nuclear cataract, bilateral 09/23/2022   Cysts of right upper eyelid 09/23/2022   Other specified disorders of eyelid 09/23/2022   Exposure to potentially hazardous substance 09/23/2022   Atrial flutter (HCC) 09/15/2022   Skin lesion 06/28/2022   Pain of left scapula  04/20/2022   Unstable angina (HCC) 12/02/2021   Intractable chronic migraine without aura and without status migrainosus 11/11/2021   Vitamin D  deficiency 06/13/2021   Encounter for fitting and adjustment of hearing aid 11/26/2020   Encounter for fitting and adjustment of spectacles and contact lenses 11/26/2020   DNR (do not resuscitate) discussion 11/26/2020   Aortic atherosclerosis (HCC) 11/26/2020   Allergic rhinitis 10/05/2020   Sensorineural hearing loss, bilateral 09/29/2020   Hallux rigidus 09/23/2020   Retinoschisis 09/23/2020   RUQ pain 06/10/2020   History of lacunar cerebrovascular accident 04/05/2020   Chronic right-sided headache 03/16/2020   Ankle pain 01/22/2020   Hypothyroidism 11/26/2019   Epididymitis 08/28/2019   Unilateral primary osteoarthritis, left hip 08/13/2019   Status post total replacement of left hip 08/13/2019   Abdominal wall hernia 11/02/2018   Progressive angina (HCC) 10/25/2018   DOE (dyspnea on exertion) 10/25/2018   Abscess of right buttock 11/27/2017   Pilon fracture 03/17/2017   Pilon fracture of left tibia 03/17/2017   At risk for adverse drug reaction 02/28/2017   Closed fracture dislocation of ankle joint, left, with delayed healing, subsequent encounter 02/22/2017   Renal stone 10/26/2016   Gross hematuria 10/26/2016   Bladder neck obstruction 10/26/2016   External hemorrhoid, thrombosed 06/10/2016   Internal hemorrhoids 06/10/2016   Lumbar back pain 05/19/2016   Osteoarthritis of right hip 10/27/2015   Chronic chest pain 09/22/2015   Acute gouty arthritis 06/11/2015   Coronary artery disease involving native coronary artery with angina pectoris (HCC) 06/11/2015   Chronic diastolic CHF (congestive heart failure) (HCC) 06/11/2015   H/O tissue AVR Oct 2016 06/10/2015   Paroxysmal atrial fibrillation (HCC) 06/10/2015   Dyslipidemia 06/10/2015   S/P CABG x 4-Oct 2016 03/30/2015   H/O NSTEMI-Oct 2016 03/27/2015   Poor drug metabolizer  due to cytochrome p450 CYP2D6 variant (HCC)    Depression 09/08/2014   GERD (gastroesophageal reflux disease)    Essential tremor 03/31/2014   Musculoskeletal pain 01/01/2014   Spondylolisthesis at L4-L5 level 01/01/2014   Sinus bradycardia 12/31/2013   Greater trochanteric bursitis of right hip 12/04/2013   OSA on CPAP    Diet-controlled diabetes mellitus (HCC) 07/08/2009   History of colonic polyps 12/31/2008   GOUT 12/22/2008   Essential hypertension 12/22/2008   Diverticulosis of colon 12/22/2008   NEPHROLITHIASIS, HX OF 12/22/2008    PCP: Roslyn Coombe, MD    REFERRING PROVIDER: Persons, Norma Beckers, [place]  REFERRING DIAG:  Diagnosis  M25.551 (ICD-10-CM) - Pain in right hip    THERAPY DIAG:  Right hip pain  Muscle weakness (generalized)  Difficulty in walking, not elsewhere classified  Other low back pain  Rationale for Evaluation and Treatment: Rehabilitation  ONSET DATE: 3-4 months progressively getting worse  SUBJECTIVE:    SUBJECTIVE STATEMENT Pt reporting some pain in his lateral left hip but not bad, denies any increased pain with activity today  PERTINENT HISTORY: THA on left 08/13/2019, THA on Rt 10/27/2015, A-fib, Cardiac valve replacement, ankle fusion 03/17/2017  PAIN:  NPRS scale: 5/10 today Pain location: Rt hip Pain description: achy, throbbing, soreness, stiffness Aggravating factors: standing, transfers, walking Relieving factors: resting, changing positions helps some but doesn't completely resolve  PRECAUTIONS: none  WEIGHT BEARING RESTRICTIONS: No  FALLS:  Has patient fallen in last 6 months? No  LIVING ENVIRONMENT: Lives with: lives with their family and lives with their spouse Lives in: House/apartment  Has following equipment at home: Single point cane, Environmental consultant - 2 wheeled, and shower chair  OCCUPATION: retired  PLOF: Independent  PATIENT GOALS: walk to my grandchild's graduation.   Next MD visit:   OBJECTIVE:    PATIENT SURVEYS:  Patient-Specific Activity Scoring Scheme  "0" represents "unable to perform." "10" represents "able to perform at prior level. 0 1 2 3 4 5 6 7 8 9  10 (Date and Score)   Activity Eval  08/28/23    1. walking  5    2. dressing  7    3. Preparing a meal 4   4.    5.    Score 16/3= 5.3    Total score = sum of the activity scores/number of activities Minimum detectable change (90%CI) for average score = 2 points Minimum detectable change (90%CI) for single activity score = 3 points  COGNITION: Overall cognitive status: WFL    SENSATION: Number and tingling down Rt LE into     POSTURE:  rounded shoulders, forward head, and decreased lumbar lordosis  PALPATION: TTP: Rt vastus intermedius  LOWER EXTREMITY ROM:   ROM Right Eval Active supine Left Eval Active supine  Hip flexion 95 104  Hip extension    Hip abduction    Hip adduction    Hip internal rotation    Hip external rotation    Knee flexion 125 112  Knee extension -6 -6  Ankle dorsiflexion    Ankle plantarflexion    Ankle inversion    Ankle eversion     (Blank rows = not tested)  LOWER EXTREMITY MMT:  MMT Right eval Left eval Right 09/19/2023 Left 09/19/2023  Hip flexion 5 5 4+/5 5/5  Hip extension      Hip abduction 3 3    Hip adduction 3 3    Hip internal rotation 3 3 4/5 4/5  Hip external rotation 3 3 4/5 4/5  Knee flexion      Knee extension      Ankle dorsiflexion      Ankle plantarflexion      Ankle inversion      Ankle eversion       (Blank rows = not tested)    FUNCTIONAL TESTS:  08/28/23: 5 time sit to stand: 27.6 seconds c UE support 09/27/23: 5 time sit to stand:13.3 seconds c UE support  5 time sit to stand:13.6 seconds c no UE support    GAIT: 08/28/23 Distance walked: 30 feet clinic distances level surfaces Assistive device utilized: Single point  cane Level of assistance: Modified independence Comments: forward flexed posture, increased bilateral   knee flexion during stance phase, decreased heel to toe strike bilaterally and decreased Dorsiflexion and mobility in left ankle.                                                                                                                                                                        TODAY'S TREATMENT                                                                          DATE: 10/04/2023 Therex:  Nustep: 8 minutes, level 5 UE/LE Supine bridges 5 sec hold 2X10 Supine SLR 2X10 bilat Supine hamstring stretch 30 sec X 2 bilat   TherActivites:  Standing hip abductions green 2X10 Standing hip extensions green 2X10 Step ups on 6 inch step with single UE support  x 10  Sit to stand 2 x 5 without UE support Leg Press: bil LE's 75#  2 x 10, single LE: 37# 2 x 10 bil LE  NMR:  Tandem stance x 2 each LE holding 30 sec no UE support Standing feet together x 2 holding 30 sec X 1 with head turns, 30 sec X 1 with head nods Standing feet together eyes closed x 30 sec c close supervision   TODAY'S TREATMENT                                                                          DATE: 10/02/2023 Therex:  Seated hamstring stretch x 3 bil holidng 30 sec Supine: bridges: x 10, Supine bridges c ball squeeze 2 x 10  Supine SLR 2 x 10 bil LE TherActivites:  Mini squats: x 10 c UE support Sit to stand 3 x 5 (see best times from above for 5 time sit to stand) Leg Press: bil LE's 75#  2 x 10, single LE: 31# x 10 bil LE NMR:  Tandem stance x 2 each LE holding 30 sec no UE support Standing feet together x 2 holding 30 sec c supervision Standing feet together eyes closed x 30 sec c close supervision     TODAY'S TREATMENT  DATE: 09/27/2023 Therex:  Nustep: 8 minutes, level 5 UE/LE Standing hip extension 2 x 10 bil c UE support Standing hip flexor stretch in lunge position x 2 bil LE's holding 30 sec with UE  support Manual:  Sidel-ying Passive ROM: hip extension, Hip flexion, quad  stretches all x 3 holding 30 seconds TherActivites:  Step taps on 6 inch step with heelx  2 x 20 Step ups on 4 inch step with single UE support 2 x 10  Sit to stand 3 x 5 (see best times from above for 5 time sit to stand) Leg Press: bil LE's 50#  2 x 10, single LE: 25# x 10 bil LE    TODAY'S TREATMENT                                                                          DATE: 09/21/2023 Yoga Bridge 2 sets of 10 for 5 seconds, slow eccentric Side lie clamshells with Black Thera-Band 2 sets of 10 for 3 seconds and slow eccentrics Reviewed and made slight modifications to HEP  Functional Activities: Sit to stand with no hands, lumbar extension and scapular retraction at the top, slow eccentrics Pull to chest Blue 20 x 3 seconds  Neuromuscular re-education: Tandem balance: 5 x 20 seconds (rest and cues for posture)   PATIENT EDUCATION:  Education details: HEP, POC Person educated: Patient Education method: Programmer, multimedia, Demonstration, Verbal cues, and Handouts Education comprehension: verbalized understanding, returned demonstration, and verbal cues required  HOME EXERCISE PROGRAM: Access Code: [email or web address] URL: [email or web address] Date: 09/21/2023 Prepared by: Terral Ferrari  Exercises - Supine Lower Trunk Rotation  - 1 x daily - 1 x weekly - 4 reps - 20 seconds hold - Supine March  - 2 x daily - 1 x weekly - 20 reps - Supine Hip Adduction Isometric with Ball  - 2 x daily - 1 x weekly - 2 sets - 10 reps - 5 seconds hold - Clamshell with Resistance  - 2 x daily - 7 x weekly - 2 sets - 10 reps - 3 seconds hold - Yoga Bridge  - 2 x daily - 7 x weekly - 2 sets - 10 reps - 5 seconds hold - Tandem Stance  - 1 x daily - 7 x weekly - 1 sets - 5 reps - 20 second hold - Sit to Stand Without Arm Support  - 2 x daily - 7 x weekly - 1 sets - 5 reps - Standing Lumbar Extension at Wall - Forearms  - 5 x  daily - 7 x weekly - 1 sets - 5 reps - 3 seconds hold - Standing Scapular Retraction  - 5 x daily - 7 x weekly - 1 sets - 5 reps - 5 second hold  ASSESSMENT:  CLINICAL IMPRESSION: He showed good exercise tolerance to strengthening exercises and balance exercises to improve standing function. PT recommending to continue with current POC.   OBJECTIVE IMPAIRMENTS: Abnormal gait, decreased balance, decreased mobility, difficulty walking, decreased ROM, decreased strength, impaired flexibility, and pain.   ACTIVITY LIMITATIONS: lifting, bending, sitting, standing, squatting, sleeping, stairs, transfers, and dressing  PARTICIPATION LIMITATIONS: meal prep and community activity  PERSONAL FACTORS: 3+ comorbidities: see PMH above  are  also affecting patient's functional outcome.   REHAB POTENTIAL: Good  CLINICAL DECISION MAKING: Evolving/moderate complexity  EVALUATION COMPLEXITY: Moderate   GOALS: Goals reviewed with patient? Yes  SHORT TERM GOALS: (target date for Short term goals are 3 weeks 09/18/2023)   1.  Patient will demonstrate independent use of home exercise program to maintain progress from in clinic treatments.  Goal status: Partially met 09/21/2023 (cues still needed)  LONG TERM GOALS: (target dates for all long term goals are 10 weeks  11/10/2023 )   1. Patient will demonstrate/report pain at worst less than or equal to 2/10 to facilitate minimal limitation in daily activity secondary to pain symptoms.  Goal status: New   2. Patient will demonstrate independent use of home exercise program to facilitate ability to maintain/progress functional gains from skilled physical therapy services.  Goal status: New   3. Patient will demonstrate Patient specific functional scale avg > or = 7.3 to indicate reduced disability due to condition.   Goal status: New   4.  Patient will demonstrate bil LE MMT >/= 4/5 throughout to faciltiate usual transfers, stairs, squatting at [facility] for  daily life.   Goal status: New   5.  Patient will demonstrate 5 time sit to stand </= 15 seconds c/s UE support.  Goal status: New   6.  Pt will be able to walk >/= 500 feet on community surfaces with LRAD.  Goal status: New      PLAN:  PT FREQUENCY: 1-2x/week  PT DURATION: 10 weeks  PLANNED INTERVENTIONS: Can include 40981- PT Re-evaluation, 97110-Therapeutic exercises, 97530- Therapeutic activity, 97112- Neuromuscular re-education, [phone]- Self Care, 97140- Manual therapy, [phone]- Gait training, [phone]- Orthotic Fit/training, [phone]- Canalith repositioning, [id number]- Aquatic Therapy, [phone]- Electrical stimulation (unattended), [phone]- Electrical stimulation (manual), [id number] Physical performance testing, 97016- Vasopneumatic device, [id number]- Ultrasound, [id number]- Traction (mechanical), [id number]- Ionotophoresis 4mg /ml Dexamethasone , Patient/Family education, Balance training, Stair training, Taping, Dry Needling, Joint mobilization, Joint manipulation, Spinal manipulation, Spinal mobilization, Scar mobilization, Vestibular training, Visual/preceptual remediation/compensation, DME instructions, Cryotherapy, and Moist heat.  All performed as medically necessary.  All included unless contraindicated  PLAN FOR NEXT SESSION:Low back, hip abductors and general lower extremity strengthening, along with activities to improve weightbearing endurance.  Jamee Mazzoni, PT, DPT 10/04/23 11:42 AM     Date of referral: 3/725 Referring provider: Persons, Norma Beckers, [place]  Referring diagnosis? REFERRING DIAG:  Diagnosis  M25.551 (ICD-10-CM) - Pain in right hip   Treatment diagnosis? (if different than referring diagnosis) M25.551, M62.81, R26.2, M54.59  What was this (referring dx) caused by? Ongoing Issue  Lonne Roan of Condition: Initial Onset (within last 3 months)   Laterality: Both  Current Functional Measure Score: Other 5.3 : pt specific activity score  Objective measurements identify impairments when  they are compared to normal values, the uninvolved extremity, and prior level of function.  [x]  Yes  []  No  Objective assessment of functional ability: Moderate functional limitations   Briefly describe symptoms: Rt and left hip pain, c Rt being worse  How did symptoms start: over time and worsening over the last 3-4 months. Pt c history of bilateral hip replacements.   Average pain intensity:  Last 24 hours: 8/10 mostly Rt hip  Past week: can reach 8/10 at times, 4/10 at the lowest during the last week .   How often does the pt experience symptoms? Constantly  How much have the symptoms interfered with usual daily activities? Quite a bit  How has condition changed since care began at  this facility? NA - initial visit  In general, how is the patients overall health? Fair   BACK PAIN (STarT Back Screening Tool) Has pain spread down the leg(s) at some time in the last 2 weeks? yes Has there been pain in the shoulder or neck at some time in the last 2 weeks? Yes, Rt shoulder Has the pt only walked short distances because of back pain? yes Has patient dressed more slowly because of back pain in the past 2 weeks? no Does patient think it's not safe for a person with this condition to be physically active? no Does patient have worrying thoughts a lot of the time? no Does patient feel back pain is terrible and will never get any better? no Has patient stopped enjoying things they usually enjoy? yes

## 2023-10-04 NOTE — Progress Notes (Signed)
 10/09/2023 ABRAHEM GOREN   Nov 08, 1940  [id number]  Primary Physician Roslyn Coombe, MD Primary Cardiologist: Dr Brentney Goldbach [place]  HPI:  83 y.o. Male seen for follow up CAD. He was admitted 10/19-10/31/16 with a non-STEMI. LHC demonstrated 3 vessel CAD. He also had significant aortic stenosis.  He underwent CABG with LIMA-LAD, SVG-intermediate, SVG-LCx, SVG-distal RCA and bioprosthetic AVR.  Atenolol  was initiated but later stopped secondary to bradycardia. He did develop atrial fibrillation. He was placed on amiodarone  with return to NSR.  He has an essential tremor and is on Primidone  chronically. 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  Amiodarone  was stopped in January 2017 at the time he was hospitalized for an acute gout attack in his [place]. He has maintained NSR.  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 He has a daughter who is a family physician in [place].  In May 2018 he complained of more dyspnea on exertion and some chest tightness.  CXR was clear. Myoview  study was normal.  In June 2018 he underwent cystoscopy and lithotripsy for renal stones.   In September 2018 he fell off his roof fracturing his left ankle and had a compression fracture of L2. He required extensive surgery of his ankle.   In May 2020 he presented with symptoms of increased DOE. He underwent cardiac cath showing all grafts were patent. Echo showed normal EF with evidence of diastolic dysfunction. The AV prothesis function had not changed. He had mild pulmonary HTN. His diuretic dose was increased.  He did undergo left THR in March 2022.    he presented to the ED on 12/02/2021 with chest pain that radiated to his back, unrelieved by nitroglycerin .  He was hospitalized from 12/02/2021 to 12/04/2021. Troponin was negative.  He underwent repeat cardiac catheterization which revealed patent bypass grafts, medical therapy was recommended and isosorbide  was  added to his medication regimen.  Echocardiogram showed EF 55 to 60%, normal LV function, mild LVH, mildly reduced RV function, mild RV enlargement, moderate dilation of the left atrium, mild lesion of the right atrium, mild mitral valve regurgitation, stable bioprosthetic aortic valve with mild aortic stenosis.    He was admitted in April with Afib/flutter with RVR. Seen by EP. Started on IV Cardizem  and switched to PO. Converted to NSR. Was on primadone for tremor and this was discontinued due to interaction with DOAC with plans to start DOAC as outpatient. Echo stable. In May he presented with right flank pain and found to have gallstones. Underwent lap chole without complications. He was seen by EP- Dr Marven Slimmer. He is s/p AF / AFL ablation 04/08/23.  Given his CYP Enzyme variant, he is not a long term OAC candidate & underwent LAAO / Watchman implant on 07/13/23. Was seen  by EP at end of March and doing well. Plan DAPT until August 7. Follow up CT planned. Instructed in SBE prophylaxis.   On follow up today he is doing OK. Still notes some SOB. Not aware of any Afib. No bleeding. Anxious to come of Plavix  so he can resume primadone. He is now on Ozempic and has lost 8 lbs.      Current Outpatient Medications  Medication Sig Dispense Refill   allopurinol  (ZYLOPRIM ) 300 MG tablet TAKE 1 TABLET BY MOUTH ONCE DAILY 90 tablet 3   amLODipine  (NORVASC ) 10 MG tablet TAKE 1 TABLET BY MOUTH EVERY DAY 90 tablet 3  amoxicillin  (AMOXIL ) 500 MG tablet Take 4 tablets by mouth one hour prior to dental procedure 4 tablet 1   Ascorbic Acid  (VITAMIN C ) 1000 MG tablet Take 1,000 mg by mouth daily.     Blood Glucose Monitoring Suppl (ONETOUCH VERIO FLEX SYSTEM) w/Device KIT      Carboxymethylcellulose Sodium (LUBRICANT EYE DROPS OP) Place 1 drop into both eyes 4 (four) times daily as needed (dry/irritated eyes.). Ocean fresh     Cholecalciferol  (VITAMIN D -3 PO) Take 2,000 Units by mouth every other day.      clopidogrel  (PLAVIX ) 75 MG tablet Take 1 tablet (75 mg total) by mouth daily. 30 tablet 6   Cyanocobalamin  (VITAMIN B-12) 5000 MCG TBDP Take 5,000 mcg by mouth every other day.     diclofenac  Sodium (VOLTAREN ) 1 % GEL Apply 2 g topically.     diltiazem  (CARDIZEM  CD) 240 MG 24 hr capsule TAKE 1 CAPSULE BY MOUTH DAILY 90 capsule 3   Erenumab-aooe (AIMOVIG) 140 MG/ML SOAJ Inject into the skin.     famotidine  (PEPCID ) 40 MG tablet TAKE 1 TABLET BY MOUTH ONCE DAILY 90 tablet 3   furosemide  (LASIX ) 40 MG tablet TAKE 1 TABLET BY MOUTH EVERY OTHER DAY 45 tablet 2   isosorbide  mononitrate (IMDUR ) 60 MG 24 hr tablet Take 1 tablet (60 mg total) by mouth daily. 90 tablet 3   Lancets (ONETOUCH DELICA PLUS LANCET30G) MISC check blood glucose DAILY     levothyroxine  (SYNTHROID ) 25 MCG tablet TAKE 1 TABLET BY MOUTH EVERY MORNING WITH BREAKFAST 90 tablet 3   nitroGLYCERIN  (NITROSTAT ) 0.4 MG SL tablet PLACE 1 TABLET UNDER THE TONGUE EVERY 5 MINUTES AS NEEDED FOR CHEST PAIN. 25 tablet 3   NON FORMULARY Pt uses a cpap nightly     ONETOUCH VERIO test strip      rosuvastatin  (CRESTOR ) 10 MG tablet TAKE 1/2 TABLET BY MOUTH EVERY DAY 45 tablet 2   Semaglutide,0.25 or 0.5MG /DOS, (OZEMPIC, 0.25 OR 0.5 MG/DOSE,) 2 MG/3ML SOPN Inject 0.5 mLs as directed once a week.     terbinafine (LAMISIL) 250 MG tablet Take 250 mg by mouth once a week. Patient taking every other week     tiZANidine  (ZANAFLEX ) 4 MG tablet Take 4 mg by mouth daily as needed for muscle spasms.     Turmeric 500 MG CAPS Take 500 mg by mouth daily.      No current facility-administered medications for this visit.    Allergies  Allergen Reactions   Acyclovir And Related Other (See Comments)    Accumulates and causes stroke-like side-effects due to cytochrome P450 enzyme deficiency   Benzodiazepines Other (See Comments)    Patient states he gets stroke symptoms with benzo's.    Compazine [Prochlorperazine Maleate] Other (See Comments)    Accumulates and  causes stroke-like side-effects due to cytochrome P450 enzyme deficiency   Coreg  [Carvedilol ] Other (See Comments)    Accumulates and causes stroke-like side-effects due to cytochrome P450 enzyme deficiency Patient poor metabolizer of CYP2D6 - Coreg  undergoes extensive hepatic (including 2D6)   Flomax  [Tamsulosin  Hcl] Other (See Comments)    Accumulates and causes stroke-like side-effects due to cytochrome P450 enzyme deficiency   Lisinopril Other (See Comments)    Hyperkalemia. 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   Mobic  [Meloxicam ] Other (See Comments)    Accumulates and causes stroke-like side-effects due to cytochrome P450 enzyme deficiency   Morphine  And Codeine Other (See Comments)    Per pt's wife morphine   also contraindicated due to p450 enzyme deficiency.     Ondansetron  Other (See Comments)    Accumulates and causes stroke-like side-effects due to cytochrome P450 enzyme deficiency    Oxycodone  Other (See Comments)    Accumulates and causes stroke-like side-effects due to cytochrome P450 enzyme deficiency   Promethazine  Other (See Comments)    Accumulates and causes stroke-like side-effects due to cytochrome P450 enzyme deficiency   Sertraline Hcl Other (See Comments)    Accumulates and causes stroke-like side-effects due to cytochrome P450 enzyme deficiency   Tramadol Other (See Comments)    Accumulates and causes stroke-like side-effects due to cytochrome P450 enzyme deficiency   Atorvastatin  Other (See Comments)    MYALGIAS   Colchicine  Other (See Comments)    ataxia   Fentanyl  Other (See Comments)    Accumulates and cause stroke like symptoms   Hydrocodone  Other (See Comments)    Accumulates and causes stroke-like side-effects due to cytochrome P450 enzyme deficiency    Social History   Socioeconomic History   Marital status: Married    Spouse name: Barrington Bores, RN   Number of children: 2   Years of  education: Not on file   Highest education level: Some college, no degree  Occupational History   Occupation: Retired Economist  Tobacco Use   Smoking status: Some Days    Current packs/day: 0.00    Average packs/day: 1 pack/day for 34.0 years (34.0 ttl pk-yrs)    Types: Cigarettes    Start date: 06/06/1956    Last attempt to quit: 06/06/1990    Years since quitting: 33.3   Smokeless tobacco: Never  Vaping Use   Vaping status: Never Used  Substance and Sexual Activity   Alcohol use: Yes    Alcohol/week: 3.0 standard drinks of alcohol    Types: 1 Cans of beer, 2 Shots of liquor per week    Comment: occassional   Drug use: No   Sexual activity: Yes  Other Topics Concern   Not on file  Social History Narrative   Left Handed    One Story home    Lives with wife and 1 dog/2025   Social Drivers of Health   Financial Resource Strain: Low Risk  (10/06/2023)   Overall Financial Resource Strain (CARDIA)    Difficulty of Paying Living Expenses: Not hard at all  Food Insecurity: No Food Insecurity (10/06/2023)   Hunger Vital Sign    Worried About Running Out of Food in the Last Year: Never true    Ran Out of Food in the Last Year: Never true  Transportation Needs: No Transportation Needs (10/06/2023)   PRAPARE - Administrator, Civil Service (Medical): No    Lack of Transportation (Non-Medical): No  Physical Activity: Insufficiently Active (10/06/2023)   Exercise Vital Sign    Days of Exercise per Week: 2 days    Minutes of Exercise per Session: 30 min  Stress: Stress Concern Present (10/06/2023)   [employer] of Occupational Health - Occupational Stress Questionnaire    Feeling of Stress : To some extent  Social Connections: Moderately Integrated (10/06/2023)   Social Connection and Isolation Panel [NHANES]    Frequency of Communication with Friends and Family: Never    Frequency of Social Gatherings with Friends and Family: Once a week    Attends Religious  Services: More than 4 times per year    Active Member of [employer] or Organizations: Yes    Attends Banker Meetings:  1 to 4 times per year    Marital Status: Married  Catering manager Violence: Not At Risk (10/06/2023)   Humiliation, Afraid, Rape, and Kick questionnaire    Fear of Current or Ex-Partner: No    Emotionally Abused: No    Physically Abused: No    Sexually Abused: No     Review of Systems: As noted in HPI All other systems reviewed and are otherwise negative except as noted above.   Blood pressure 136/72, pulse 70, height 5\' 8"  (1.727 m), weight 187 lb 9.6 oz (85.1 kg), SpO2 92%.  GENERAL:  Well appearing WM in NAD HEENT:  PERRL, EOMI, sclera are clear. Oropharynx is clear. NECK:  No jugular venous distention, carotid upstroke brisk and symmetric, no bruits, no thyromegaly or adenopathy LUNGS:  Clear to auscultation bilaterally CHEST:  Unremarkable, old sternotomy scar HEART:  RRR,  PMI not displaced or sustained,S1 and S2 within normal limits, no S3, no S4: no clicks, no rubs, gr 2/6 systolic murmur RUSB.  ABD:  Soft, nontender. BS +, no masses or bruits. No hepatomegaly, no splenomegaly EXT:  2 + pulses throughout, no edema, no cyanosis no clubbing SKIN:  Warm and dry.  No rashes NEURO:  Alert and oriented x 3. Cranial nerves II through XII intact. PSYCH:  Cognitively intact       Lab Results  Component Value Date   WBC 7.8 06/27/2023   HGB 14.1 06/27/2023   HCT 42.4 06/27/2023   PLT 210 06/27/2023   GLUCOSE 157 (H) 08/30/2023   CHOL 115 06/28/2023   TRIG 131.0 06/28/2023   HDL 46.10 06/28/2023   LDLDIRECT 77.0 06/28/2022   LDLCALC 43 06/28/2023   ALT 28 06/28/2023   AST 28 06/28/2023   NA 140 08/30/2023   K 4.4 08/30/2023   CL 101 08/30/2023   CREATININE 0.94 08/30/2023   BUN 13 08/30/2023   CO2 24 08/30/2023   TSH 4.45 06/28/2023   PSA 1.95 06/28/2022   INR 1.1 09/15/2022   HGBA1C 7.6 (H) 06/28/2023   MICROALBUR 24.0 (H) 06/28/2023      Echo 04/24/15: Study Conclusions   - Left ventricle: The cavity size was normal. Wall thickness was   increased in a pattern of mild LVH. Systolic function was normal.   The estimated ejection fraction was in the range of 55% to 60%.   Wall motion was normal; there were no regional wall motion   abnormalities. Doppler parameters are consistent with abnormal   left ventricular relaxation (grade 1 diastolic dysfunction). - Aortic valve: A bioprosthesis was present. There was trivial   regurgitation. - Left atrium: The atrium was mildly dilated. - Atrial septum: There was an atrial septal aneurysm. - Pericardium, extracardiac: A small pericardial effusion was   identified.   Impressions:   - Normal LV systolic function; grade 1 diastolic dysfunction; mild   LVH; s/p AVR with trace AI and mean gradient of 18 mmHg; mild   LAE; trace MR.  Myoview  10/22/16: Study Highlights     The left ventricular ejection fraction is mildly decreased (45-54%). Nuclear stress EF: 53%. No T wave inversion was noted during stress. There was no ST segment deviation noted during stress. This is a low risk study.   No reversible ischemia. LVEF 53% with normal wall motion. This is a low risk study.     Cardiac cath: 10/25/18:  LEFT HEART CATH AND CORS/GRAFTS ANGIOGRAPHY  Conclusion    Ost LAD to Prox LAD lesion is 80% stenosed.  Mid LAD lesion is 100% stenosed -after second septal branch which coursed is a tandem LAD LIMA-dLAD graft was visualized by angiography and is normal in caliber. The graft exhibits no disease. Ost Ramus-1 lesion is 90% stenosed. Ost Ramus-2 lesion is 60% stenosed (shortly after lateral branch). SVG-Ramus graft was visualized by angiography and is large. The graft exhibits minimal luminal irregularities. Ost Cx lesion is 80% stenosed. Prox Cx to Mid Cx lesion is 80% stenosed. Mid Cx lesion is 60% stenosed. SVG-dCx graft was visualized by angiography and is large. The graft  exhibits minimal luminal irregularities. Retrograde filling of OM 2 and AV groove circumflex Prox RCA to Mid RCA lesion is 100% stenosed. This is progression from 99% stenosis pre-CABG. SVG-dRCA graft was visualized by angiography and is large. The graft exhibits no disease. Unable to cross aortic valve prosthesis.   SUMMARY Severe native CAD essentially unchanged from post CABG with exception of now 100% occluded RCA. 4 out of 4 patent grafts: LIMA-LAD, SVG-RI (D1), SVG-dCx, SVG-D RCA Systemic Hypertension Angiographically small aortic root/stent prosthetic valve.  Unable to cross.     RECOMMENDATIONS Discharge home after bedrest and hydration Consider 2D echocardiogram to assess for restenosis of aortic valve Pending echocardiographic evaluation, would consider non-anginal etiology -> with hypertension, could be related to diastolic dysfunction.     Randene Bustard, MD   Echo 11/20/18:IMPRESSIONS      1. A 23mm an Edwards porcine bioprosthesis valve is present in the aortic position. Procedure Date: 03/30/15 Echo findings shows no evidence of regurgitation, perivalvular leak, rocking or dehiscence of the aortic prosthesis. Bioprosthetic leaflets not  well seen. Mean systolic gradient is 19 mmHg, no significant difference from gradient reported 1 month post operative in 04/2015.  2. The left ventricle has normal systolic function with an ejection fraction of 60-65%. The cavity size was normal. There is mildly increased left ventricular wall thickness. Left ventricular diastolic Doppler parameters are consistent with impaired  relaxation due to nondiagnostic images. Elevated left ventricular end-diastolic pressure The E/e' is 20.  3. The right ventricle has normal systolic function. The cavity was normal. There is no increase in right ventricular wall thickness. Right ventricular systolic pressure is mildly elevated with an estimated pressure of 35.9 mmHg.  4. Left atrial size was  mild-moderately dilated.  5. There is mild mitral annular calcification present.  6. There is mild dilatation of the ascending aorta measuring 40 mm.   Cardiac cath 12/03/21:  LEFT HEART CATH AND CORS/GRAFTS ANGIOGRAPHY  CORONARY/GRAFT ANGIOGRAPHY   Conclusion      Ost LAD to Prox LAD lesion is 80% stenosed.   Mid LAD lesion is 100% stenosed.   Ost Cx lesion is 80% stenosed.   Prox RCA to Mid RCA lesion is 100% stenosed.   Ost Ramus-1 lesion is 90% stenosed.   Ost Ramus-2 lesion is 60% stenosed.   Prox Cx-2 lesion is 100% stenosed.   Prox Cx-1 lesion is 80% stenosed.   LIMA and is normal in caliber.   SVG and is large.   SVG and is large.   SVG and is large.   The graft exhibits no disease.   The graft exhibits minimal luminal irregularities.   The graft exhibits minimal luminal irregularities.   The graft exhibits no disease.   3 vessel obstructive CAD.  All grafts are still patent No significant change from 2020.   Plan: recommend medical therapy. Some of his angina could be explained by disease in the native LCx proximal  to a relatively small OM. Again this hasn't changed significantly.   Diagnostic Dominance: Right  Intervention   Echo 12/04/21: IMPRESSIONS     1. Left ventricular ejection fraction, by estimation, is 55 to 60%. The  left ventricle has normal function. The left ventricle has no regional  wall motion abnormalities. There is mild left ventricular hypertrophy.  Left ventricular diastolic parameters  were normal.   2. Right ventricular systolic function is mildly reduced. The right  ventricular size is mildly enlarged.   3. Left atrial size was moderately dilated.   4. Right atrial size was mildly dilated.   5. The mitral valve is abnormal. Mild mitral valve regurgitation. No  evidence of mitral stenosis. Moderate mitral annular calcification.   6. 23 mm Edwards porcine bioprosthetic valve no AR mean gradient improved  since TTE done 11/1818 19.2->  16.5 mmHg. The aortic valve has been  repaired/replaced. There is mild calcification of the aortic valve. There  is mild thickening of the aortic valve.   Aortic valve regurgitation is not visualized. Mild aortic valve stenosis.  There is a 23 mm Edwards porcine valve present in the aortic position.   7. The inferior vena cava is normal in size with greater than 50%  respiratory variability, suggesting right atrial pressure of 3 mmHg.   Echo 09/16/22: IMPRESSIONS     1. Left ventricular ejection fraction, by estimation, is 60 to 65%. The  left ventricle has normal function. The left ventricle has no regional  wall motion abnormalities. There is moderate left ventricular hypertrophy.  Left ventricular diastolic  parameters are consistent with Grade I diastolic dysfunction (impaired  relaxation).   2. Right ventricular systolic function is normal. The right ventricular  size is normal. There is normal pulmonary artery systolic pressure.   3. No evidence of mitral valve regurgitation.   4. S/p 23 mm Edwards Porcine AVR. V max 2.3 m/s. Mean gradient 12 mmHg.  DI 0.45. No PVL. Normal prosthesis. Aortic valve regurgitation is not  visualized.   5. Aortic aorta is not well visualized.   6. The inferior vena cava is dilated in size with >50% respiratory  variability, suggesting right atrial pressure of 8 mmHg.    ASSESSMENT AND PLAN:  1.  CAD s/p CABG in October 2016 following NSTEMI.   Myoview  negative in May 2018. Cardiac cath in May 2020 showed all grafts were patent. No significant angina.  Continue statin. Not on beta blocker due to bradycardia.   2. S/p bioprosthetic AVR for aortic stenosis. On routine SBE prophylaxis.  Echo April 2020 with good valve function.    3.  Hyperlipidemia. Satisfactory on Crestor  5 mg daily.  Last LDL 43  4. HTN. Well controlled today. Not a candidate for beta blocker due to bradycardia, would avoid ACEi, ARB, aldactone with elevated potassium.  5.  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   6. Paroxysmal Afib.s/p ablation and Watchman device. Follow up CT. Follow up with EP in August. Plan to stop plavix  then.  Maintaining NSR now..   Follow up in 6 months      Kathelyn Gombos [place] MD,FACC 10/09/2023 11:44 AM

## 2023-10-05 ENCOUNTER — Ambulatory Visit

## 2023-10-06 ENCOUNTER — Ambulatory Visit (INDEPENDENT_AMBULATORY_CARE_PROVIDER_SITE_OTHER)

## 2023-10-06 VITALS — Ht 68.5 in | Wt 195.0 lb

## 2023-10-06 DIAGNOSIS — Z0000 Encounter for general adult medical examination without abnormal findings: Secondary | ICD-10-CM | POA: Diagnosis not present

## 2023-10-06 LAB — HM DIABETES EYE EXAM

## 2023-10-06 NOTE — Patient Instructions (Addendum)
 Troy Banks , Thank you for taking time to come for your Medicare Wellness Visit. I appreciate your ongoing commitment to your health goals. Please review the following plan we discussed and let me know if I can assist you in the future.   Referrals/Orders/Follow-Ups/Clinician Recommendations: It was nice to speak with you today.  Each day, aim for 6 glasses of water , plenty of protein in your diet and try to get up and walk/ stretch every hour for 5-10 minutes at a time.    This is a list of the screening recommended for you and due dates:  Health Maintenance  Topic Date Due   Zoster (Shingles) Vaccine (1 of 2) Never done   Screening for Lung Cancer  Never done   COVID-19 Vaccine (5 - 2024-25 season) 02/05/2023   Medicare Annual Wellness Visit  09/01/2023   Colon Cancer Screening  06/30/2024*   Eye exam for diabetics  10/19/2023   Hemoglobin A1C  12/26/2023   Flu Shot  01/05/2024   Yearly kidney health urinalysis for diabetes  06/27/2024   Complete foot exam   06/27/2024   Yearly kidney function blood test for diabetes  08/29/2024   DTaP/Tdap/Td vaccine (5 - Td or Tdap) 11/01/2028   Pneumonia Vaccine  Completed   HPV Vaccine  Aged Out   Meningitis B Vaccine  Aged Out   Hepatitis C Screening  Discontinued  *Topic was postponed. The date shown is not the original due date.    Advanced directives: (Copy Requested) Please bring a copy of your health care power of attorney and living will to the office to be added to your chart at your convenience. You can mail to [facility] [address] [place], [place] [address] or email to [email or web address]  Next Medicare Annual Wellness Visit scheduled for next year: Yes  Have you seen your provider in the last 6 months (3 months if uncontrolled diabetes)? Yes, March 2025.

## 2023-10-06 NOTE — Progress Notes (Signed)
 Subjective:   Troy Banks is a 83 y.o. who presents for a Medicare Wellness preventive visit.  Visit Complete: Virtual I connected with  Troy Banks on 10/06/23 by a audio enabled telemedicine application and verified that I am speaking with the correct person using two identifiers.  Patient Location: Home  Provider Location: Office/Clinic  I discussed the limitations of evaluation and management by telemedicine. The patient expressed understanding and agreed to proceed.  Vital Signs: Because this visit was a virtual/telehealth visit, some criteria may be missing or patient reported. Any vitals not documented were not able to be obtained and vitals that have been documented are patient reported.  VideoDeclined- This patient declined Librarian, academic. Therefore the visit was completed with audio only.  Persons Participating in Visit: Patient.  AWV Questionnaire: No: Patient Medicare AWV questionnaire was not completed prior to this visit.  Cardiac Risk Factors include: advanced age (>73men, >7 women);male gender;Other (see comment);dyslipidemia;diabetes mellitus;smoking/ tobacco exposure, Risk factor comments: CHF, OSA,     Objective:    Today's Vitals   10/06/23 0813 10/06/23 0830  Weight: 195 lb (88.5 kg)   Height: 5' 8.5" (1.74 m)   PainSc:  6    Body mass index is 29.22 kg/m.     10/06/2023    8:39 AM 08/28/2023   11:07 AM 07/31/2023   10:53 AM 07/13/2023    7:40 AM 04/07/2023    5:57 AM 10/24/2022   10:16 AM 10/13/2022    1:17 PM  Advanced Directives  Does Patient Have a Medical Advance Directive? Yes Yes Yes No Yes Yes No  Type of Estate agent of [place];Living will;Out of facility DNR (pink MOST or yellow form) Healthcare Power of [place];Living will   Healthcare Power of [place];Living will;Out of facility DNR (pink MOST or yellow form) Healthcare Power of [place];Living will   Does patient want to  make changes to medical advance directive?  No - Patient declined No - Patient declined  No - Patient declined    Copy of Healthcare Power of Attorney in Chart? No - copy requested    No - copy requested Yes - validated most recent copy scanned in chart (See row information)   Would patient like information on creating a medical advance directive?    No - Patient declined       Current Medications (verified) Outpatient Encounter Medications as of 10/06/2023  Medication Sig   allopurinol  (ZYLOPRIM ) 300 MG tablet TAKE 1 TABLET BY MOUTH ONCE DAILY   amLODipine  (NORVASC ) 10 MG tablet TAKE 1 TABLET BY MOUTH EVERY DAY   Ascorbic Acid  (VITAMIN C ) 1000 MG tablet Take 1,000 mg by mouth daily.   Blood Glucose Monitoring Suppl (ONETOUCH VERIO FLEX SYSTEM) w/Device KIT    Carboxymethylcellulose Sodium (LUBRICANT EYE DROPS OP) Place 1 drop into both eyes 4 (four) times daily as needed (dry/irritated eyes.). Ocean fresh   Cholecalciferol  (VITAMIN D -3 PO) Take 2,000 Units by mouth every other day.   clopidogrel  (PLAVIX ) 75 MG tablet Take 1 tablet (75 mg total) by mouth daily.   Cyanocobalamin  (VITAMIN B-12) 5000 MCG TBDP Take 5,000 mcg by mouth every other day.   diltiazem  (CARDIZEM  CD) 240 MG 24 hr capsule TAKE 1 CAPSULE BY MOUTH DAILY   Erenumab-aooe (AIMOVIG) 140 MG/ML SOAJ Inject into the skin.   famotidine  (PEPCID ) 40 MG tablet TAKE 1 TABLET BY MOUTH ONCE DAILY   furosemide  (LASIX ) 40 MG tablet TAKE 1 TABLET BY MOUTH EVERY  OTHER DAY   isosorbide  mononitrate (IMDUR ) 60 MG 24 hr tablet Take 1 tablet (60 mg total) by mouth daily.   Lancets (ONETOUCH DELICA PLUS LANCET30G) MISC check blood glucose DAILY   nitroGLYCERIN  (NITROSTAT ) 0.4 MG SL tablet PLACE 1 TABLET UNDER THE TONGUE EVERY 5 MINUTES AS NEEDED FOR CHEST PAIN.   ONETOUCH VERIO test strip    rosuvastatin  (CRESTOR ) 10 MG tablet TAKE 1/2 TABLET BY MOUTH EVERY DAY   Semaglutide,0.25 or 0.5MG /DOS, (OZEMPIC, 0.25 OR 0.5 MG/DOSE,) 2 MG/3ML SOPN Inject  0.5 mLs as directed once a week.   terbinafine (LAMISIL) 250 MG tablet Take 250 mg by mouth once a week. Patient taking every other week   tiZANidine  (ZANAFLEX ) 4 MG tablet Take 4 mg by mouth daily as needed for muscle spasms.   Turmeric 500 MG CAPS Take 500 mg by mouth daily.    amoxicillin  (AMOXIL ) 500 MG tablet Take 4 tablets by mouth one hour prior to dental procedure (Patient not taking: Reported on 10/06/2023)   diclofenac  Sodium (VOLTAREN ) 1 % GEL Apply 2 g topically.   levothyroxine  (SYNTHROID ) 25 MCG tablet TAKE 1 TABLET BY MOUTH EVERY MORNING WITH BREAKFAST (Patient not taking: Reported on 10/06/2023)   NON FORMULARY Pt uses a cpap nightly   [DISCONTINUED] ranitidine  (ZANTAC ) 150 MG tablet TAKE 1 TABLET BY MOUTH 2 TIMES DAILY (Patient not taking: Reported on 08/30/2023)   No facility-administered encounter medications on file as of 10/06/2023.    Allergies (verified) Acyclovir and related, Benzodiazepines, Compazine [prochlorperazine maleate], Coreg  [carvedilol ], Flomax  [tamsulosin  hcl], Lisinopril, Mobic  [meloxicam ], Morphine  and codeine, Ondansetron , Oxycodone , Promethazine , Sertraline hcl, Tramadol, Atorvastatin , Colchicine , Fentanyl , and Hydrocodone    History: Past Medical History:  Diagnosis Date   Allergy    Anxiety    PTSD   Aortic stenosis, moderate 03/27/2015   post AVR echo Echo 11/16: Mild LVH, EF 55-60%, normal wall motion, grade 1 diastolic dysfunction, bioprosthetic AVR okay (mean gradient 18 mmHg) with trivial AI, mild LAE, atrial septal aneurysm, small effusion    Arthritis    "qwhere" (10/29/2015)   Cancer (HCC)    skin cancer   Carotid artery stenosis    Carotid US  11/17: 1-39% bilateral ICA stenosis. F/u prn   Cervical stenosis of spinal canal    fusion 2006   CHF (congestive heart failure) (HCC)    "secondry to OHS"   Closed left pilon fracture    COLONIC POLYPS, HX OF    Complication of anesthesia    "takes a lot to put him under and has woken up during  surgery" Difficult to wake up . PTSD; "does not metabolize RX well"that he gets "violent", per pt.    Coronary artery disease    Depression    Difficult intubation 03/30/2015   Glidescop and 4 used 10/28/22, 04/07/23 due to history   DIVERTICULOSIS, COLON    Dyspnea    Dysrhythmia    2 bouts of afib post op and at home but corrected with medication.   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 Marven Slimmer   PTSD (post-traumatic stress disorder)    PTSD (post-traumatic stress disorder)    Sleep apnea    On a CPAP   Past Surgical History:  Procedure Laterality Date   ANKLE FUSION Left 03/17/2017   Procedure: FUSION  PILON FRACTURE WITH BONE GRAFTING;  Surgeon: Laneta Pintos, MD;  Location: [facility] OR;  Service: Orthopedics;  Laterality: Left;   ANTERIOR FUSION CERVICAL SPINE  2002   AORTIC VALVE REPLACEMENT N/A 03/30/2015   Procedure: AORTIC VALVE REPLACEMENT (AVR);  Surgeon: Norita Beauvais, MD;  Location[facility];  Service: Open Heart Surgery;  Laterality: N/A;   ATRIAL FIBRILLATION ABLATION N/A 04/07/2023   Procedure: ATRIAL FIBRILLATION ABLATION;  Surgeon: Boyce Byes, MD;  Location: [facility] INVASIVE CV LAB;  Service: Cardiovascular;  Laterality: N/A;   CARDIAC CATHETERIZATION N/A 03/26/2015    Procedure: Left Heart Cath and Coronary Angiography;  Surgeon: Arleen Lacer, MD;  Location: [facility] INVASIVE CV LAB;  Service: Cardiovascular;  Laterality: N/A;   CARDIAC VALVE REPLACEMENT     CHOLECYSTECTOMY N/A 10/28/2022   Procedure: LAPAROSCOPIC CHOLECYSTECTOMY WITH INTRAOPERATIVE CHOLANGIOGRAM;  Surgeon: Jacolyn Matar, MD;  Location: [facility] ORS;  Service: General;  Laterality: N/A;   COLONOSCOPY W/ POLYPECTOMY     CORONARY ARTERY BYPASS GRAFT N/A 03/30/2015   Procedure: CORONARY ARTERY BYPASS GRAFTING (CABG) x four, using left internal mammary artery and left    leg greater saphenous vein harvested endoscopically ;  Surgeon: Norita Beauvais, MD;  Location: [facility] OR;  Service: Open Heart Surgery;  Laterality: N/A;   CORONARY/GRAFT ANGIOGRAPHY N/A 12/03/2021   Procedure: CORONARY/GRAFT ANGIOGRAPHY;  Surgeon: Swaziland, [name], MD;  Location: [facility] INVASIVE CV LAB;  Service: Cardiovascular;  Laterality: N/A;   CYSTOSCOPY/URETEROSCOPY/HOLMIUM LASER/STENT PLACEMENT Bilateral 11/07/2016   Procedure: CYSTOSCOPY/URETEROSCOPY/HOLMIUM LASER/STENT PLACEMENT;  Surgeon: Bart Born, MD;  Location: [facility] ORS;  Service: Urology;  Laterality: Bilateral;   CYSTOSCOPY/URETEROSCOPY/HOLMIUM LASER/STENT PLACEMENT Bilateral 11/23/2016   Procedure: CYSTOSCOPY/BILATERAL URETEROSCOPY/HOLMIUM LASER/STENT EXCHANGE;  Surgeon: Bart Born, MD;  Location: [facility] ORS;  Service: Urology;  Laterality: Bilateral;  NEEDS 90 MIN    EXTERNAL FIXATION LEG Left 02/22/2017   Procedure: EXTERNAL FIXATION LEFT ANKLE;  Surgeon: Saundra Curl, MD;  Location: [facility] OR;  Service: Orthopedics;  Laterality: Left;   HEMORRHOID BANDING  1970s   INGUINAL HERNIA REPAIR Left 1962   KIDNEY STONE SURGERY  1986   "cut me open"   LEFT ATRIAL APPENDAGE OCCLUSION N/A 07/13/2023   Procedure: LEFT ATRIAL APPENDAGE OCCLUSION;  Surgeon: Boyce Byes, MD;  [facility] INVAS[facility] CV LAB;  Service: Cardiovascular;  Laterality: N/A;   LEFT HEART CATH AND  CORS/GRAFTS ANGIOGRAPHY N/A 10/25/2018   Procedure: LEFT HEART CATH AND CORS/GRAFTS ANGIOGRAPHY;  Surgeon: Arleen Lacer, MD;  Location: [facility] INVASIVE CV LAB;  Service: Cardiovascular;  Laterality: N/A;   LEFT HEART CATH AND CORS/GRAFTS ANGIOGRAPHY N/A 12/03/2021   Procedure: LEFT HEART CATH AND CORS/GRAFTS ANGIOGRAPHY;  Surgeon: Swaziland, [name], MD;  Location: [facility] INVASIVE CV LAB;  Service: Cardiovascular;  Laterality: N/A;   POSTERIOR FUSION CERVICAL SPINE  2006   TEE WITHOUT CARDIOVERSION N/A 03/30/2015   Procedure: TRANSESOPHAGEAL ECHOCARDIOGRAM (TEE);  Surgeon: Norita Beauvais, MD;  Location: [facility] OR;  Service: Open Heart Surgery;  Laterality: N/A;   TESTICLE TORSION REDUCTION Right 1960   TOE FUSION Left 1985 & 2004   "great toe" and removal   TONSILLECTOMY     TOTAL HIP ARTHROPLASTY Right 10/27/2015   Procedure: RIGHT TOTAL HIP ARTHROPLASTY ANTERIOR APPROACH and steroid injection right foot;  Surgeon: Arnie Lao, MD;  [facility] OR;  Service: Orthopedics;  Laterality: Right;   TOTAL HIP ARTHROPLASTY Left 08/13/2019   TOTAL HIP ARTHROPLASTY Left 08/13/2019   Procedure: LEFT TOTAL HIP ARTHROPLASTY ANTERIOR APPROACH;  Surgeon: Arnie Lao, MD;  Location: [facility] OR;  Service: Orthopedics;  Laterality: Left;   TRANSESOPHAGEAL ECHOCARDIOGRAM (CATH LAB) N/A 07/13/2023   Procedure: TRANSESOPHAGEAL ECHOCARDIOGRAM;  Surgeon: Boyce Byes, MD;  Location: [facility] INVASIVE CV LAB;  Service: Cardiovascular;  Laterality: N/A;   Family History  Problem Relation Age of Onset   Heart disease Mother    Hypertension Mother    Diabetes Mother    Heart disease Father    Hypertension Father    Tremor Sister    Healthy Brother    Healthy Son    Prostate cancer Maternal Uncle    Hypertension Maternal Grandmother    Heart disease Maternal Grandmother    Hypertension Maternal Grandfather    Heart disease Maternal Grandfather    Hypertension Paternal Grandfather    Heart disease  Paternal Grandfather    Diabetes Paternal Grandfather    Alcohol abuse Other    Arthritis Other    Drug abuse Sister    Healthy Daughter    Colon polyps Neg Hx    Esophageal cancer Neg Hx    Rectal cancer Neg Hx    Stomach cancer Neg Hx    Colon cancer Neg Hx    Social History   Socioeconomic History   Marital status: Married    Spouse name: Barrington Bores, RN   Number of children: 2   Years of education: Not on file   Highest education level: Some college, no degree  Occupational History   Occupation: Retired Economist  Tobacco Use   Smoking status: Some Days    Current packs/day: 0.00    Average packs/day: 1 pack/day for 34.0 years (34.0 ttl pk-yrs)    Types: Cigarettes    Start date: 06/06/1956    Last attempt to quit: 06/06/1990    Years since quitting: 33.3   Smokeless tobacco: Never  Vaping Use   Vaping status: Never Used  Substance and Sexual Activity   Alcohol use: Yes    Alcohol/week: 3.0 standard drinks of alcohol    Types: 1 Cans of beer, 2 Shots of liquor per week    Comment: occassional   Drug use: No   Sexual activity: Yes  Other Topics Concern   Not on file  Social History Narrative   Left Handed    One Story home    Lives with wife and 1 dog/2025   Social Drivers of Health   Financial Resource Strain: Low Risk  (10/06/2023)   Overall Financial Resource Strain (CARDIA)    Difficulty of Paying Living Expenses: Not hard at all  Food Insecurity: No Food Insecurity (10/06/2023)   Hunger Vital Sign    Worried About Running Out of Food in the Last Year: Never true    Ran Out of Food in the Last  Year: Never true  Transportation Needs: No Transportation Needs (10/06/2023)   PRAPARE - Administrator, Civil Service (Medical): No    Lack of Transportation (Non-Medical): No  Physical Activity: Insufficiently Active (10/06/2023)   Exercise Vital Sign    Days of Exercise per Week: 2 days    Minutes of Exercise per Session: 30 min  Stress:  Stress Concern Present (10/06/2023)   [employer] of Occupational Health - Occupational Stress Questionnaire    Feeling of Stress : To some extent  Social Connections: Moderately Integrated (10/06/2023)   Social Connection and Isolation Panel [NHANES]    Frequency of Communication with Friends and Family: Never    Frequency of Social Gatherings with Friends and Family: Once a week    Attends Religious Services: More than 4 times per year    Active Member of [employer] or Organizations: Yes    Attends Banker Meetings: 1 to 4 times per year    Marital Status: Married    Tobacco Counseling Ready to quit: Not Answered Counseling given: Not Answered    Clinical Intake:  Pre-visit preparation completed: Yes  Pain : 0-10 Pain Score: 6  Pain Type: Chronic pain Pain Location: Hip Pain Orientation: Left, Anterior Pain Descriptors / Indicators: Constant, Aching Pain Onset: More than a month ago Pain Frequency: Constant Pain Relieving Factors: Aleve  Pain Relieving Factors: Aleve  BMI - recorded: 29.22 Nutritional Status: BMI 25 -29 Overweight Nutritional Risks: None Diabetes: Yes CBG done?: No Did pt. bring in CBG monitor from home?: No  Lab Results  Component Value Date   HGBA1C 7.6 (H) 06/28/2023   HGBA1C 7.1 (H) 12/27/2022   HGBA1C 6.6 (H) 10/24/2022     How often do you need to have someone help you when you read instructions, pamphlets, or other written materials from your doctor or pharmacy?: 1 - Never  Interpreter Needed?: No  Information entered by :: Jaiah Weigel, RMA   Activities of Daily Living     10/06/2023    8:34 AM 07/13/2023    7:46 AM  In your present state of health, do you have any difficulty performing the following activities:  Hearing? 1 0  Comment Wears hearing aides   Vision? 0 0  Difficulty concentrating or making decisions? 0 0  Walking or climbing stairs? 0   Dressing or bathing? 0   Doing errands, shopping? 0   Comment  wife drives him around   Preparing Food and eating ? N   Using the Toilet? N   In the past six months, have you accidently leaked urine? N   Do you have problems with loss of bowel control? N   Managing your Medications? N   Managing your Finances? N   Housekeeping or managing your Housekeeping? N     Patient Care Team: Roslyn Coombe, MD as PCP - General (Internal Medicine) [place], [name], MD as PCP - Cardiology (Cardiology) Boyce Byes, MD as PCP - Electrophysiology (Cardiology) [place], [name], MD as Consulting Physician (Cardiology) Arnie Lao, MD as Consulting Physician (Orthopedic Surgery) Ruffin Cotton, DPM as Consulting Physician (Podiatry) [name], Von Grumbling, DO as Consulting Physician (Neurology) Aminta Kales, MD as Consulting Physician (Ophthalmology)  Indicate any recent Medical Services you may have received from other than [facility] providers in the past year (date may be approximate).     Assessment:   This is a routine wellness examination for [facility].  Hearing/Vision screen Hearing Screening - Comments::  Wears hearing aides Vision Screening - Comments:: Wears eyeglasses   Goals Addressed             This Visit's Progress    Client understands the importance of follow-up with providers by attending scheduled visits.   On track      Depression Screen     10/06/2023    8:41 AM 08/09/2023    2:31 PM 07/31/2023   10:54 AM 06/28/2023    2:37 PM 02/02/2023    2:38 PM 12/27/2022    9:26 AM 11/16/2022   10:55 AM  PHQ 2/9 Scores  PHQ - 2 Score 2 0 0 0 4 3 0  PHQ- 9 Score 4    14 8      Fall Risk     10/06/2023    8:39 AM 08/09/2023    2:37 PM 07/31/2023   10:53 AM 06/28/2023    2:37 PM 05/18/2023    3:30 PM  Fall Risk   Falls in the past year? 0 0 0  0  Number falls in past yr: 0 0  0 0  Injury with Fall? 0 0  0 0  Risk for fall due to : Medication side effect No Fall Risks     Follow up Falls prevention discussed;Falls evaluation completed  Falls evaluation completed   Falls evaluation completed    MEDICARE RISK AT HOME:  Medicare Risk at Home Any stairs in or around the home?: Yes If so, are there any without handrails?: Yes Home free of loose throw rugs in walkways, pet beds, electrical cords, etc?: Yes Adequate lighting in your home to reduce risk of falls?: Yes Life alert?: No Grab bars in the bathroom?: No Shower chair or bench in shower?: Yes Elevated toilet seat or a handicapped toilet?: Yes  TIMED UP AND GO:  Was the test performed?  No  Cognitive Function: Declined/Normal: No cognitive concerns noted by patient or family. Patient alert, oriented, able to answer questions appropriately and recall recent events. No signs of memory loss or confusion.        09/01/2022    3:36 PM 04/01/2019    9:18 AM  6CIT Screen  What Year? 0 points 0 points  What month? 0 points 0 points  What time? 0 points 0 points  Count back from 20 0 points 0 points  Months in reverse 0 points 0 points  Repeat phrase 0 points 0 points  Total Score 0 points 0 points    Immunizations Immunization History  Administered Date(s) Administered   DT (Pediatric) 06/06/2008   PFIZER Comirnaty(Gray Top)Covid-19 Tri-Sucrose Vaccine 08/10/2020   PFIZER(Purple Top)SARS-COV-2 Vaccination 02/17/2020, 03/09/2020, 08/10/2020   Pneumococcal Conjugate-13 09/08/2014, 10/04/2017   Pneumococcal Polysaccharide-23 10/27/2017   Pneumococcal-Unspecified 02/20/2008   Td 06/06/2008   Tdap 11/21/2012, 11/02/2018    Screening Tests Health Maintenance  Topic Date Due   Zoster Vaccines- Shingrix (1 of 2) Never done   Lung Cancer Screening  Never done   COVID-19 Vaccine (5 - 2024-25 season) 02/05/2023   Medicare Annual Wellness (AWV)  09/01/2023   Colonoscopy  06/30/2024 (Originally 03/13/2021)   OPHTHALMOLOGY EXAM  10/19/2023   HEMOGLOBIN A1C  12/26/2023   INFLUENZA VACCINE  01/05/2024   Diabetic kidney evaluation - Urine ACR  06/27/2024   FOOT  EXAM  06/27/2024   Diabetic kidney evaluation - eGFR measurement  08/29/2024   DTaP/Tdap/Td (5 - Td or Tdap) 11/01/2028   Pneumonia Vaccine 73+ Years old  Completed   HPV VACCINES  Aged Out   Meningococcal B Vaccine  Aged Out   Hepatitis C Screening  Discontinued    Health Maintenance  Health Maintenance Due  Topic Date Due   Zoster Vaccines- Shingrix (1 of 2) Never done   Lung Cancer Screening  Never done   COVID-19 Vaccine (5 - 2024-25 season) 02/05/2023   Medicare Annual Wellness (AWV)  09/01/2023   Health Maintenance Items Addressed: See Nurse Notes  Additional Screening:  Vision Screening: Recommended annual ophthalmology exams for early detection of glaucoma and other disorders of the eye.  Dental Screening: Recommended annual dental exams for proper oral hygiene  Community Resource Referral / Chronic Care Management: CRR required this visit?  No   CCM required this visit?  No     Plan:     I have personally reviewed and noted the following in the patient's chart:   Medical and social history Use of alcohol, tobacco or illicit drugs  Current medications and supplements including opioid prescriptions. Patient is not currently taking opioid prescriptions. Functional ability and status Nutritional status Physical activity Advanced directives List of other physicians Hospitalizations, surgeries, and ER visits in previous 12 months Vitals Screenings to include cognitive, depression, and falls Referrals and appointments  In addition, I have reviewed and discussed with patient certain preventive protocols, quality metrics, and best practice recommendations. A written personalized care plan for preventive services as well as general preventive health recommendations were provided to patient.     Emmabelle Fear L Rylee Huestis, CMA   10/06/2023   After Visit Summary: (MyChart) Due to this being a telephonic visit, the after visit summary with patients personalized plan was  offered to patient via MyChart   Notes: Please refer to Routing Comments.

## 2023-10-09 ENCOUNTER — Ambulatory Visit: Admitting: Physical Therapy

## 2023-10-09 ENCOUNTER — Ambulatory Visit: Payer: Medicare Other | Attending: Cardiology | Admitting: Cardiology

## 2023-10-09 ENCOUNTER — Encounter: Payer: Self-pay | Admitting: Cardiology

## 2023-10-09 VITALS — BP 136/72 | HR 70 | Ht 68.0 in | Wt 187.6 lb

## 2023-10-09 DIAGNOSIS — I480 Paroxysmal atrial fibrillation: Secondary | ICD-10-CM | POA: Diagnosis not present

## 2023-10-09 DIAGNOSIS — M25551 Pain in right hip: Secondary | ICD-10-CM

## 2023-10-09 DIAGNOSIS — R262 Difficulty in walking, not elsewhere classified: Secondary | ICD-10-CM | POA: Diagnosis not present

## 2023-10-09 DIAGNOSIS — Z952 Presence of prosthetic heart valve: Secondary | ICD-10-CM

## 2023-10-09 DIAGNOSIS — I25118 Atherosclerotic heart disease of native coronary artery with other forms of angina pectoris: Secondary | ICD-10-CM | POA: Diagnosis not present

## 2023-10-09 DIAGNOSIS — M6281 Muscle weakness (generalized): Secondary | ICD-10-CM

## 2023-10-09 DIAGNOSIS — Z951 Presence of aortocoronary bypass graft: Secondary | ICD-10-CM | POA: Diagnosis not present

## 2023-10-09 DIAGNOSIS — Z95818 Presence of other cardiac implants and grafts: Secondary | ICD-10-CM | POA: Diagnosis not present

## 2023-10-09 DIAGNOSIS — M5459 Other low back pain: Secondary | ICD-10-CM

## 2023-10-09 DIAGNOSIS — Z01812 Encounter for preprocedural laboratory examination: Secondary | ICD-10-CM | POA: Diagnosis not present

## 2023-10-09 NOTE — Therapy (Signed)
 OUTPATIENT PHYSICAL THERAPY LOWER EXTREMITY TREATMENT  Patient Name: Troy Banks MRN: [id number] DOB:1940/08/30, 83 y.o., male Today's Date: 10/09/2023  END OF SESSION:  PT End of Session - 10/09/23 1323     Visit Number 8    Number of Visits 20    Date for PT Re-Evaluation 11/10/23    Authorization Type [employer] Medicare    Progress Note Due on Visit 10    PT Start Time 1300    PT Stop Time 1345    PT Time Calculation (min) 45 min    Activity Tolerance Patient tolerated treatment well    Behavior During Therapy WFL for tasks assessed/performed                Past Medical History:  Diagnosis Date   Allergy    Anxiety    PTSD   Aortic stenosis, moderate 03/27/2015   post AVR echo Echo 11/16: Mild LVH, EF 55-60%, normal wall motion, grade 1 diastolic dysfunction, bioprosthetic AVR okay (mean gradient 18 mmHg) with trivial AI, mild LAE, atrial septal aneurysm, small effusion    Arthritis    "qwhere" (10/29/2015)   Cancer (HCC)    skin cancer   Carotid artery stenosis    Carotid US  11/17: 1-39% bilateral ICA stenosis. F/u prn   Cervical stenosis of spinal canal    fusion 2006   CHF (congestive heart failure) (HCC)    "secondry to [facility]"   Closed left pilon fracture    COLONIC POLYPS, HX OF    Complication of anesthesia    "takes a lot to put him under and has woken up during surgery" Difficult to wake up . PTSD; "does not metabolize RX well"that he gets "violent", per pt.    Coronary artery disease    Depression    Difficult intubation 03/30/2015   Glidescop and 4 used 10/28/22, 04/07/23 due to history   DIVERTICULOSIS, COLON    Dyspnea    Dysrhythmia    2 bouts of afib post op and at home but corrected with medication.   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 Marven Slimmer   PTSD (post-traumatic stress disorder)    PTSD (post-traumatic stress disorder)    Sleep apnea    On a CPAP   Past Surgical History:  Procedure Laterality Date   ANKLE FUSION Left 03/17/2017   Procedure: FUSION  PILON FRACTURE WITH BONE GRAFTING;  Surgeon: Laneta Pintos, MD;  Location: [facility] OR;  Service: Orthopedics;  Laterality: Left;   ANTERIOR FUSION CERVICAL SPINE  2002   AORTIC VALVE REPLACEMENT N/A 03/30/2015   Procedure: AORTIC VALVE REPLACEMENT (  AVR);  Surgeon: Norita Beauvais, MD;  Location[facility] OR;  Service: Open Heart Surgery;  Laterality: N/A;   ATRIAL FIBRILLATION ABLATION N/A 04/07/2023   Procedure: ATRIAL FIBRILLATION ABLATION;  Surgeon: Boyce Byes, MD;  Location: [facility] INVASIVE CV LAB;  Service: Cardiovascular;  Laterality: N/A;   CARDIAC CATHETERIZATION N/A 03/26/2015   Procedure: Left Heart Cath and Coronary Angiography;  Surgeon: Arleen Lacer, MD;  Location: [facility] INVASIVE CV LAB;  Service: Cardiovascular;  Laterality: N/A;   CARDIAC VALVE REPLACEMENT     CHOLECYSTECTOMY N/A 10/28/2022   Procedure: LAPAROSCOPIC CHOLECYSTECTOMY WITH INTRAOPERATIVE CHOLANGIOGRAM;  Surgeon: Jacolyn Matar, MD;  Location: [facility] ORS;  Service: General;  Laterality: N/A;   COLONOSCOPY W/ POLYPECTOMY     CORONARY ARTERY BYPASS GRAFT N/A 03/30/2015   Procedure: CORONARY ARTERY BYPASS GRAFTING (CABG) x four, using left internal mammary artery and left    leg greater saphenous vein  harvested endoscopically ;  Surgeon: Norita Beauvais, MD;  Location: [facility] OR;  Service: Open Heart Surgery;  Laterality: N/A;   CORONARY/GRAFT ANGIOGRAPHY N/A 12/03/2021   Procedure: CORONARY/GRAFT ANGIOGRAPHY;  Surgeon: Swaziland, [name], MD;  Location: [facility] INVASIVE CV LAB;  Service: Cardiovascular;  Laterality: N/A;   CYSTOSCOPY/URETEROSCOPY/HOLMIUM LASER/STENT PLACEMENT Bilateral 11/07/2016   Procedure: CYSTOSCOPY/URETEROSCOPY/HOLMIUM LASER/STENT PLACEMENT;  Surgeon: Bart Born, MD;  Location: [facility] ORS;  Service: Urology;  Laterality: Bilateral;   CYSTOSCOPY/URETEROSCOPY/HOLMIUM LASER/STENT PLACEMENT Bilateral 11/23/2016   Procedure: CYSTOSCOPY/BILATERAL URETEROSCOPY/HOLMIUM LASER/STENT EXCHANGE;  Surgeon: Bart Born, MD;  Location: [facility] ORS;  Service: Urology;  Laterality: Bilateral;  NEEDS 90 MIN    EXTERNAL FIXATION LEG Left 02/22/2017   Procedure: EXTERNAL FIXATION LEFT ANKLE;  Surgeon: Saundra Curl, MD;  Location: [facility] OR;  Service: Orthopedics;  Laterality: Left;   HEMORRHOID BANDING  1970s   INGUINAL HERNIA REPAIR Left 1962   KIDNEY STONE SURGERY  1986   "cut me open"   LEFT ATRIAL APPENDAGE OCCLUSION N/A 07/13/2023   Procedure: LEFT ATRIAL APPENDAGE OCCLUSION;  Surgeon: Boyce Byes, MD;  Location: [facility] INVASIVE CV LAB;  Service: Cardiovascular;  Laterality: N/A;   LEFT HEART CATH AND CORS/GRAFTS ANGIOGRAPHY N/A 10/25/2018   Procedure: LEFT HEART CATH AND CORS/GRAFTS ANGIOGRAPHY;  Surgeon: Arleen Lacer, MD;  Location: [facility] INVASIVE CV LAB;  Service: Cardiovascular;  Laterality: N/A;   LEFT HEART CATH AND CORS/GRAFTS ANGIOGRAPHY N/A 12/03/2021   Procedure: LEFT HEART CATH AND CORS/GRAFTS ANGIOGRAPHY;  Surgeon: Swaziland, [name], MD;  Location: [facility] INVASIVE CV LAB;  Service: Cardiovascular;  Laterality: N/A;   POSTERIOR FUSION CERVICAL SPINE  2006   TEE WITHOUT CARDIOVERSION N/A 03/30/2015   Procedure: TRANSESOPHAGEAL ECHOCARDIOGRAM (TEE);  Surgeon: Norita Beauvais, MD;   Location: [facility] OR;  Service: Open Heart Surgery;  Laterality: N/A;   TESTICLE TORSION REDUCTION Right 1960   TOE FUSION Left 1985 & 2004   "great toe" and removal   TONSILLECTOMY     TOTAL HIP ARTHROPLASTY Right 10/27/2015   Procedure: RIGHT TOTAL HIP ARTHROPLASTY ANTERIOR APPROACH and steroid injection right foot;  Surgeon: Arnie Lao, MD;  Location: [facility] OR;  Service: Orthopedics;  Laterality: Right;   TOTAL HIP ARTHROPLASTY Left 08/13/2019   TOTAL HIP ARTHROPLASTY Left 08/13/2019   Procedure: LEFT TOTAL HIP ARTHROPLASTY ANTERIOR APPROACH;  Surgeon: Arnie Lao, MD;  Location: [facility] OR;  Service: Orthopedics;  Laterality: Left;   TRANSESOPHAGEAL ECHOCARDIOGRAM (CATH LAB) N/A 07/13/2023   Procedure: TRANSESOPHAGEAL ECHOCARDIOGRAM;  Surgeon: Boyce Byes, MD;  Location: [facility] INVASIVE CV  LAB;  Service: Cardiovascular;  Laterality: N/A;   Patient Active Problem List   Diagnosis Date Noted   Refractory migraine 08/15/2023   Bursitis of right hip 08/12/2023   Hyperglycemia due to type 2 diabetes mellitus (HCC) 08/09/2023   Atrial fibrillation (HCC) 07/13/2023   Presence of Watchman left atrial appendage closure device 07/13/2023   Encounter for well adult exam with abnormal findings 06/28/2023   Cough 05/20/2023   Smoker 12/27/2022   Acute pain 12/22/2022   Cervical spondylosis without myelopathy 12/22/2022   Cervicogenic headache 12/22/2022   Medication overuse headache 12/22/2022   Hyperlipidemia 12/22/2022   Idiopathic peripheral neuropathy 12/22/2022   Prediabetes 12/22/2022   Cervicalgia 10/19/2022   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 10/14/2022   Right inguinal hernia 10/05/2022   Age-related nuclear cataract, bilateral 09/23/2022   Cysts of right upper eyelid 09/23/2022   Other specified disorders of eyelid 09/23/2022   Exposure to potentially hazardous substance 09/23/2022   Atrial flutter (HCC) 09/15/2022   Skin lesion 06/28/2022   Pain of left scapula  04/20/2022   Unstable angina (HCC) 12/02/2021   Intractable chronic migraine without aura and without status migrainosus 11/11/2021   Vitamin D  deficiency 06/13/2021   Encounter for fitting and adjustment of hearing aid 11/26/2020   Encounter for fitting and adjustment of spectacles and contact lenses 11/26/2020   DNR (do not resuscitate) discussion 11/26/2020   Aortic atherosclerosis (HCC) 11/26/2020   Allergic rhinitis 10/05/2020   Sensorineural hearing loss, bilateral 09/29/2020   Hallux rigidus 09/23/2020   Retinoschisis 09/23/2020   RUQ pain 06/10/2020   History of lacunar cerebrovascular accident 04/05/2020   Chronic right-sided headache 03/16/2020   Ankle pain 01/22/2020   Hypothyroidism 11/26/2019   Epididymitis 08/28/2019   Unilateral primary osteoarthritis, left hip 08/13/2019   Status post total replacement of left hip 08/13/2019   Abdominal wall hernia 11/02/2018   Progressive angina (HCC) 10/25/2018   DOE (dyspnea on exertion) 10/25/2018   Abscess of right buttock 11/27/2017   Pilon fracture 03/17/2017   Pilon fracture of left tibia 03/17/2017   At risk for adverse drug reaction 02/28/2017   Closed fracture dislocation of ankle joint, left, with delayed healing, subsequent encounter 02/22/2017   Renal stone 10/26/2016   Gross hematuria 10/26/2016   Bladder neck obstruction 10/26/2016   External hemorrhoid, thrombosed 06/10/2016   Internal hemorrhoids 06/10/2016   Lumbar back pain 05/19/2016   Osteoarthritis of right hip 10/27/2015   Chronic chest pain 09/22/2015   Acute gouty arthritis 06/11/2015   Coronary artery disease involving native coronary artery with angina pectoris (HCC) 06/11/2015   Chronic diastolic CHF (congestive heart failure) (HCC) 06/11/2015   H/O tissue AVR Oct 2016 06/10/2015   Paroxysmal atrial fibrillation (HCC) 06/10/2015   Dyslipidemia 06/10/2015   S/P CABG x 4-Oct 2016 03/30/2015   H/O NSTEMI-Oct 2016 03/27/2015   Poor drug metabolizer  due to cytochrome p450 CYP2D6 variant (HCC)    Depression 09/08/2014   GERD (gastroesophageal reflux disease)    Essential tremor 03/31/2014   Musculoskeletal pain 01/01/2014   Spondylolisthesis at L4-L5 level 01/01/2014   Sinus bradycardia 12/31/2013   Greater trochanteric bursitis of right hip 12/04/2013   OSA on CPAP    Diet-controlled diabetes mellitus (HCC) 07/08/2009   History of colonic polyps 12/31/2008   GOUT 12/22/2008   Essential hypertension 12/22/2008   Diverticulosis of colon 12/22/2008   NEPHROLITHIASIS, HX OF 12/22/2008    PCP: Roslyn Coombe, MD    REFERRING PROVIDER: Persons, Norma Beckers, [place]  REFERRING DIAG:  Diagnosis  M25.551 (ICD-10-CM) - Pain in right hip    THERAPY DIAG:  Right hip pain  Muscle weakness (generalized)  Difficulty in walking, not elsewhere classified  Other low back pain  Rationale for Evaluation and Treatment: Rehabilitation  ONSET DATE: 3-4 months progressively getting worse  SUBJECTIVE:    SUBJECTIVE STATEMENT Pt arriving today reporting 8/10 pain in bil hips, left worse than right.   PERTINENT HISTORY: THA on left 08/13/2019, THA on Rt 10/27/2015, A-fib, Cardiac valve replacement, ankle fusion 03/17/2017  PAIN:  NPRS scale: 5/10 today Pain location: Rt hip Pain description: achy, throbbing, soreness, stiffness Aggravating factors: standing, transfers, walking Relieving factors: resting, changing positions helps some but doesn't completely resolve  PRECAUTIONS: none  WEIGHT BEARING RESTRICTIONS: No  FALLS:  Has patient fallen in last 6 months? No  LIVING ENVIRONMENT: Lives with: lives with their family and lives with their spouse Lives in: House/apartment  Has following equipment at home: Single point cane, Environmental consultant - 2 wheeled, and shower chair  OCCUPATION: retired  PLOF: Independent  PATIENT GOALS: walk to my grandchild's graduation.   Next MD visit:   OBJECTIVE:   PATIENT SURVEYS:  Patient-Specific  Activity Scoring Scheme  "0" represents "unable to perform." "10" represents "able to perform at prior level. 0 1 2 3 4 5 6 7 8 9  10 (Date and Score)   Activity Eval  08/28/23 10/09/23    1. walking  5  4  2. dressing  7  8  3. Preparing a meal 4 10  4.    5.    Score 16/3= 5.3 22/3=7.3   Total score = sum of the activity scores/number of activities Minimum detectable change (90%CI) for average score = 2 points Minimum detectable change (90%CI) for single activity score = 3 points  COGNITION: Overall cognitive status: WFL    SENSATION: Number and tingling down Rt LE into     POSTURE:  rounded shoulders, forward head, and decreased lumbar lordosis  PALPATION: TTP: Rt vastus intermedius  LOWER EXTREMITY ROM:   ROM Right Eval Active supine Left Eval Active supine  Hip flexion 95 104  Hip extension    Hip abduction    Hip adduction    Hip internal rotation    Hip external rotation    Knee flexion 125 112  Knee extension -6 -6  Ankle dorsiflexion    Ankle plantarflexion    Ankle inversion    Ankle eversion     (Blank rows = not tested)  LOWER EXTREMITY MMT:  MMT Right eval Left eval Right 09/19/2023 Left 09/19/2023  Hip flexion 5 5 4+/5 5/5  Hip extension      Hip abduction 3 3    Hip adduction 3 3    Hip internal rotation 3 3 4/5 4/5  Hip external rotation 3 3 4/5 4/5  Knee flexion      Knee extension      Ankle dorsiflexion      Ankle plantarflexion      Ankle inversion      Ankle eversion       (Blank rows = not tested)    FUNCTIONAL TESTS:  08/28/23: 5 time sit to stand: 27.6 seconds c UE support 09/27/23: 5 time sit to stand:13.3 seconds c UE support  5 time sit to stand:13.6 seconds c no UE support    GAIT: 08/28/23 Distance walked: 30 feet clinic distances level surfaces Assistive device utilized: Single point cane Level of assistance:  Modified independence Comments: forward flexed posture, increased bilateral  knee flexion during  stance phase, decreased heel to toe strike bilaterally and decreased Dorsiflexion and mobility in left ankle.                                                                                                                                                                         TODAY'S TREATMENT                                                                          DATE: 5/5 /2025 Therex:  Seated hamstring stretch x 3 bil holidng 30 sec Supine: bridges: x 10, Supine bridges c ball squeeze 2 x 10  Supine SLR 2 x 10 bil LE TherActivites:  Mini squats: x 10 c UE support Sit to stand 3 x 5 (see best times from above for 5 time sit to stand) Leg Press: bil LE's 75#  2 x 10, single LE: 31# x 10 bil LE NMR:  Tandem stance x 2 each LE holding 30 sec no UE support Standing feet together x 2 holding 30 sec c supervision Standing feet together eyes closed x 30 sec c close supervision     TODAY'S TREATMENT                                                                          DATE: 10/04/2023 Therex:  Nustep: 8 minutes, level 5 UE/LE Supine bridges 5 sec hold 2X10 Supine SLR 2X10 bilat Supine hamstring stretch 30 sec X 2 bilat   TherActivites:  Standing hip abductions green 2X10 Standing hip extensions green 2X10 Step ups on 6 inch step with single UE support  x 10  Sit to stand 2 x 5 without UE support Leg Press: bil LE's 75#  2 x 10, single LE: 37# 2 x 10 bil LE  NMR:  Tandem stance x 2 each LE holding 30 sec no UE support Standing feet together x 2 holding 30 sec X 1 with head turns, 30 sec X 1 with head nods Standing feet together eyes closed x 30 sec c close supervision   TODAY'S TREATMENT  DATE: 10/02/2023 Therex:  Seated hamstring stretch x 3 bil holidng 30 sec Supine: bridges: x 10, Supine bridges c ball squeeze 2 x 10  Supine SLR 2 x 10 bil LE TherActivites:  Mini squats: x 10 c UE support Sit  to stand 3 x 5 (see best times from above for 5 time sit to stand) Leg Press: bil LE's 75#  2 x 10, single LE: 31# x 10 bil LE NMR:  Tandem stance x 2 each LE holding 30 sec no UE support Standing feet together x 2 holding 30 sec c supervision Standing feet together eyes closed x 30 sec c close supervision      PATIENT EDUCATION:  Education details: HEP, POC Person educated: Patient Education method: Programmer, multimedia, Demonstration, Verbal cues, and Handouts Education comprehension: verbalized understanding, returned demonstration, and verbal cues required  HOME EXERCISE PROGRAM: Access Code: [email or web address] URL: [email or web address] Date: 09/21/2023 Prepared by: Terral Ferrari  Exercises - Supine Lower Trunk Rotation  - 1 x daily - 1 x weekly - 4 reps - 20 seconds hold - Supine March  - 2 x daily - 1 x weekly - 20 reps - Supine Hip Adduction Isometric with Ball  - 2 x daily - 1 x weekly - 2 sets - 10 reps - 5 seconds hold - Clamshell with Resistance  - 2 x daily - 7 x weekly - 2 sets - 10 reps - 3 seconds hold - Yoga Bridge  - 2 x daily - 7 x weekly - 2 sets - 10 reps - 5 seconds hold - Tandem Stance  - 1 x daily - 7 x weekly - 1 sets - 5 reps - 20 second hold - Sit to Stand Without Arm Support  - 2 x daily - 7 x weekly - 1 sets - 5 reps - Standing Lumbar Extension at Wall - Forearms  - 5 x daily - 7 x weekly - 1 sets - 5 reps - 3 seconds hold - Standing Scapular Retraction  - 5 x daily - 7 x weekly - 1 sets - 5 reps - 5 second hold  ASSESSMENT:  CLINICAL IMPRESSION: Pt arriving with trendelenburg gait pattern. Pt's leg length compared and 1/4 inch heel lift added to pt's left shoe. Pt's gait improved following. Pt's can measured and it was too short it was not adjustable. Pt and his wife stating he had an adjustable at home and agreed to bring it to one of his next session. Pt tolerating treatment session well. Continue to progress toward LTG's set.      OBJECTIVE  IMPAIRMENTS: Abnormal gait, decreased balance, decreased mobility, difficulty walking, decreased ROM, decreased strength, impaired flexibility, and pain.   ACTIVITY LIMITATIONS: lifting, bending, sitting, standing, squatting, sleeping, stairs, transfers, and dressing  PARTICIPATION LIMITATIONS: meal prep and community activity  PERSONAL FACTORS: 3+ comorbidities: see PMH above  are also affecting patient's functional outcome.   REHAB POTENTIAL: Good  CLINICAL DECISION MAKING: Evolving/moderate complexity  EVALUATION COMPLEXITY: Moderate   GOALS: Goals reviewed with patient? Yes  SHORT TERM GOALS: (target date for Short term goals are 3 weeks 09/18/2023)   1.  Patient will demonstrate independent use of home exercise program to maintain progress from in clinic treatments.  Goal status: Partially met 09/21/2023 (cues still needed)  LONG TERM GOALS: (target dates for all long term goals are 10 weeks  11/10/2023 )   1. Patient will demonstrate/report pain at worst less than or equal to 2/10 to facilitate minimal limitation in  daily activity secondary to pain symptoms.  Goal status: New   2. Patient will demonstrate independent use of home exercise program to facilitate ability to maintain/progress functional gains from skilled physical therapy services.  Goal status: New   3. Patient will demonstrate Patient specific functional scale avg > or = 7.3 to indicate reduced disability due to condition.   Goal status: New   4.  Patient will demonstrate bil LE MMT >/= 4/5 throughout to faciltiate usual transfers, stairs, squatting at [facility] for daily life.   Goal status: New   5.  Patient will demonstrate 5 time sit to stand </= 15 seconds c/s UE support.  Goal status: New   6.  Pt will be able to walk >/= 500 feet on community surfaces with LRAD.  Goal status: New      PLAN:  PT FREQUENCY: 1-2x/week  PT DURATION: 10 weeks  PLANNED INTERVENTIONS: Can include 78469- PT  Re-evaluation, 97110-Therapeutic exercises, 97530- Therapeutic activity, 97112- Neuromuscular re-education, [phone]- Self Care, 97140- Manual therapy, [phone]- Gait training, [phone]- Orthotic Fit/training, [phone]- Canalith repositioning, [id number]- Aquatic Therapy, [phone]- Electrical stimulation (unattended), [phone]- Electrical stimulation (manual), [id number] Physical performance testing, 97016- Vasopneumatic device, [id number]- Ultrasound, [id number]- Traction (mechanical), [id number]- Ionotophoresis 4mg /ml Dexamethasone , Patient/Family education, Balance training, Stair training, Taping, Dry Needling, Joint mobilization, Joint manipulation, Spinal manipulation, Spinal mobilization, Scar mobilization, Vestibular training, Visual/preceptual remediation/compensation, DME instructions, Cryotherapy, and Moist heat.  All performed as medically necessary.  All included unless contraindicated  PLAN FOR NEXT SESSION:Low back, hip abductors and general lower extremity strengthening, along with activities to improve weightbearing endurance.  Jerrel Mor, PT, MPT 10/09/23 1:30 PM   10/09/23 1:30 PM   10/09/23 1:30 PM     Date of referral: 3/725 Referring provider: Persons, Norma Beckers, PA  Referring diagnosis? REFERRING DIAG:  Diagnosis  M25.551 (ICD-10-CM) - Pain in right hip   Treatment diagnosis? (if different than referring diagnosis) M25.551, M62.81, R26.2, M54.59  What was this (referring dx) caused by? Ongoing Issue  Lonne Roan of Condition: Initial Onset (within last 3 months)   Laterality: Both  Current Functional Measure Score: Other 5.3 : pt specific activity score  Objective measurements identify impairments when they are compared to normal values, the uninvolved extremity, and prior level of function.  [x]  Yes  []  No  Objective assessment of functional ability: Moderate functional limitations   Briefly describe symptoms: Rt and left hip pain, c Rt being worse  How did symptoms start: over time and  worsening over the last 3-4 months. Pt c history of bilateral hip replacements.   Average pain intensity:  Last 24 hours: 8/10 mostly Rt hip  Past week: can reach 8/10 at times, 4/10 at the lowest during the last week .   How often does the pt experience symptoms? Constantly  How much have the symptoms interfered with usual daily activities? Quite a bit  How has condition changed since care began at this facility? NA - initial visit  In general, how is the patients overall health? Fair   BACK PAIN (STarT Back Screening Tool) Has pain spread down the leg(s) at some time in the last 2 weeks? yes Has there been pain in the shoulder or neck at some time in the last 2 weeks? Yes, Rt shoulder Has the pt only walked short distances because of back pain? yes Has patient dressed more slowly because of back pain in the past 2 weeks? no Does patient think it's not safe for a person with this condition  to be physically active? no Does patient have worrying thoughts a lot of the time? no Does patient feel back pain is terrible and will never get any better? no Has patient stopped enjoying things they usually enjoy? yes

## 2023-10-09 NOTE — Patient Instructions (Signed)
 Medication Instructions:  Continue same medications *If you need a refill on your cardiac medications before your next appointment, please call your pharmacy*  Lab Work: Bmet today  Testing/Procedures: None ordered  Follow-Up: At [facility], you and your health needs are our priority.  As part of our continuing mission to provide you with exceptional heart care, our providers are all part of one team.  This team includes your primary Cardiologist (physician) and Advanced Practice Providers or APPs (Physician Assistants and Nurse Practitioners) who all work together to provide you with the care you need, when you need it.  Your next appointment:  6 months   Call in July to schedule Nov appointment     Provider:  Dr.Jordan   We recommend signing up for the patient portal called "MyChart".  Sign up information is provided on this After Visit Summary.  MyChart is used to connect with patients for Virtual Visits (Telemedicine).  Patients are able to view lab/test results, encounter notes, upcoming appointments, etc.  Non-urgent messages can be sent to your provider as well.   To learn more about what you can do with MyChart, go to [email or web address].

## 2023-10-10 LAB — BASIC METABOLIC PANEL WITH GFR
BUN/Creatinine Ratio: 12 (ref 10–24)
BUN: 11 mg/dL (ref 8–27)
CO2: 22 mmol/L (ref 20–29)
Calcium: 9.8 mg/dL (ref 8.6–10.2)
Chloride: 106 mmol/L (ref 96–106)
Creatinine, Ser: 0.95 mg/dL (ref 0.76–1.27)
Glucose: 133 mg/dL — ABNORMAL HIGH (ref 70–99)
Potassium: 4.9 mmol/L (ref 3.5–5.2)
Sodium: 144 mmol/L (ref 134–144)
eGFR: 81 mL/min/{1.73_m2} (ref >59)

## 2023-10-11 ENCOUNTER — Other Ambulatory Visit: Payer: Self-pay | Admitting: Nurse Practitioner

## 2023-10-11 ENCOUNTER — Encounter: Admitting: Physical Therapy

## 2023-10-12 ENCOUNTER — Encounter: Admitting: Rehabilitative and Restorative Service Providers"

## 2023-10-17 ENCOUNTER — Other Ambulatory Visit: Payer: Self-pay

## 2023-10-17 ENCOUNTER — Other Ambulatory Visit: Payer: Self-pay | Admitting: Student

## 2023-10-17 DIAGNOSIS — M47812 Spondylosis without myelopathy or radiculopathy, cervical region: Secondary | ICD-10-CM | POA: Diagnosis not present

## 2023-10-17 MED ORDER — ISOSORBIDE MONONITRATE ER 60 MG PO TB24: 60.0000 mg | ORAL_TABLET | Freq: Every day | ORAL | 3 refills | Status: DC

## 2023-10-19 ENCOUNTER — Encounter: Payer: Self-pay | Admitting: Rehabilitative and Restorative Service Providers"

## 2023-10-19 ENCOUNTER — Ambulatory Visit (HOSPITAL_COMMUNITY)
Admission: RE | Admit: 2023-10-19 | Discharge: 2023-10-19 | Disposition: A | Discharge status: Home / Self Care | Source: Ambulatory Visit | Attending: Cardiovascular Disease | Admitting: Cardiovascular Disease

## 2023-10-19 ENCOUNTER — Ambulatory Visit: Admitting: Rehabilitative and Restorative Service Providers"

## 2023-10-19 DIAGNOSIS — Z95818 Presence of other cardiac implants and grafts: Secondary | ICD-10-CM | POA: Diagnosis not present

## 2023-10-19 DIAGNOSIS — I517 Cardiomegaly: Secondary | ICD-10-CM | POA: Diagnosis not present

## 2023-10-19 DIAGNOSIS — R262 Difficulty in walking, not elsewhere classified: Secondary | ICD-10-CM | POA: Diagnosis not present

## 2023-10-19 DIAGNOSIS — I480 Paroxysmal atrial fibrillation: Secondary | ICD-10-CM | POA: Diagnosis not present

## 2023-10-19 DIAGNOSIS — M5459 Other low back pain: Secondary | ICD-10-CM | POA: Diagnosis not present

## 2023-10-19 DIAGNOSIS — E1165 Type 2 diabetes mellitus with hyperglycemia: Secondary | ICD-10-CM | POA: Diagnosis not present

## 2023-10-19 DIAGNOSIS — M25551 Pain in right hip: Secondary | ICD-10-CM | POA: Diagnosis not present

## 2023-10-19 DIAGNOSIS — M6281 Muscle weakness (generalized): Secondary | ICD-10-CM

## 2023-10-19 DIAGNOSIS — I700 Atherosclerosis of aorta: Secondary | ICD-10-CM | POA: Diagnosis not present

## 2023-10-19 MED ORDER — IOHEXOL 350 MG/ML SOLN
100.0000 mL | Freq: Once | INTRAVENOUS | Status: AC | PRN
  Administered 2023-10-19: 100 mL via INTRAVENOUS

## 2023-10-19 NOTE — Therapy (Signed)
 OUTPATIENT PHYSICAL THERAPY LOWER EXTREMITY TREATMENT NOTE  Patient Name: Troy Banks MRN: [id number] DOB:02-14-1941, 83 y.o., male Today's Date: 10/19/2023  END OF SESSION:  PT End of Session - 10/19/23 1257     Visit Number 9    Number of Visits 20    Date for PT Re-Evaluation 11/10/23    Authorization Type [employer] Medicare    Progress Note Due on Visit 10    PT Start Time 1147    PT Stop Time 1232    PT Time Calculation (min) 45 min    Activity Tolerance Patient tolerated treatment well;No increased pain;Patient limited by pain    Behavior During Therapy [facility] for tasks assessed/performed              Past Medical History:  Diagnosis Date   Allergy    Anxiety    PTSD   Aortic stenosis, moderate 03/27/2015   post AVR echo Echo 11/16: Mild LVH, EF 55-60%, normal wall motion, grade 1 diastolic dysfunction, bioprosthetic AVR okay (mean gradient 18 mmHg) with trivial AI, mild LAE, atrial septal aneurysm, small effusion    Arthritis    "qwhere" (10/29/2015)   Cancer (HCC)    skin cancer   Carotid artery stenosis    Carotid US  11/17: 1-39% bilateral ICA stenosis. F/u prn   Cervical stenosis of spinal canal    fusion 2006   CHF (congestive heart failure) (HCC)    "secondry to [facility]"   Closed left pilon fracture    COLONIC POLYPS, HX OF    Complication of anesthesia    "takes a lot to put him under and has woken up during surgery" Difficult to wake up . PTSD; "does not metabolize RX well"that he gets "violent", per pt.    Coronary artery disease    Depression    Difficult intubation 03/30/2015   Glidescop and 4 used 10/28/22, 04/07/23 due to history   DIVERTICULOSIS, COLON    Dyspnea    Dysrhythmia    2 bouts of afib post op and at home but corrected with medication.   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 Marven Slimmer   PTSD (post-traumatic stress disorder)    PTSD (post-traumatic stress disorder)    Sleep apnea    On a CPAP   Past Surgical History:  Procedure Laterality Date   ANKLE FUSION Left 03/17/2017   Procedure: FUSION  PILON FRACTURE WITH BONE GRAFTING;  Surgeon: Laneta Pintos, MD;  Location: [facility] OR;  Service: Orthopedics;  Laterality: Left;   ANTERIOR FUSION CERVICAL SPINE  2002   AORTIC VALVE REPLACEMENT N/A 03/30/2015  Procedure: AORTIC VALVE REPLACEMENT (AVR);  Surgeon: Norita Beauvais, MD;  Location: [facility] OR;  Service: Open Heart Surgery;  Laterality: N/A;   ATRIAL FIBRILLATION ABLATION N/A 04/07/2023   Procedure: ATRIAL FIBRILLATION ABLATION;  Surgeon: Boyce Byes, MD;  Location: [facility] INVASIVE CV LAB;  Service: Cardiovascular;  Laterality: N/A;   CARDIAC CATHETERIZATION N/A 03/26/2015   Procedure: Left Heart Cath and Coronary Angiography;  Surgeon: Arleen Lacer, MD;  Location: [facility] INVASIVE CV LAB;  Service: Cardiovascular;  Laterality: N/A;   CARDIAC VALVE REPLACEMENT     CHOLECYSTECTOMY N/A 10/28/2022   Procedure: LAPAROSCOPIC CHOLECYSTECTOMY WITH INTRAOPERATIVE CHOLANGIOGRAM;  Surgeon: Jacolyn Matar, MD;  Location: [facility] ORS;  Service: General;  Laterality: N/A;   COLONOSCOPY W/ POLYPECTOMY     CORONARY ARTERY BYPASS GRAFT N/A 03/30/2015   Procedure: CORONARY ARTERY BYPASS GRAFTING (CABG) x four, using left internal mammary artery  and left    leg greater saphenous vein harvested endoscopically ;  Surgeon: Norita Beauvais, MD;  Location: [facility] OR;  Service: Open Heart Surgery;  Laterality: N/A;   CORONARY/GRAFT ANGIOGRAPHY N/A 12/03/2021   Procedure: CORONARY/GRAFT ANGIOGRAPHY;  Surgeon: Swaziland, [name], MD;  Location: [facility] INVASIVE CV LAB;  Service: Cardiovascular;  Laterality: N/A;   CYSTOSCOPY/URETEROSCOPY/HOLMIUM LASER/STENT PLACEMENT Bilateral 11/07/2016   Procedure: CYSTOSCOPY/URETEROSCOPY/HOLMIUM LASER/STENT PLACEMENT;  Surgeon: Bart Born, MD;  Location: [facility] ORS;  Service: Urology;  Laterality: Bilateral;   CYSTOSCOPY/URETEROSCOPY/HOLMIUM LASER/STENT PLACEMENT Bilateral 11/23/2016   Procedure: CYSTOSCOPY/BILATERAL URETEROSCOPY/HOLMIUM LASER/STENT EXCHANGE;  Surgeon: Bart Born, MD;  Location: [facility] ORS;  Service: Urology;  Laterality: Bilateral;  NEEDS 90 MIN    EXTERNAL FIXATION LEG Left 02/22/2017   Procedure: EXTERNAL FIXATION LEFT ANKLE;  Surgeon: Saundra Curl, MD;  Location: [facility] OR;  Service: Orthopedics;  Laterality: Left;   HEMORRHOID BANDING  1970s   INGUINAL HERNIA REPAIR Left 1962   KIDNEY STONE SURGERY  1986   "cut me open"   LEFT ATRIAL APPENDAGE OCCLUSION N/A 07/13/2023   Procedure: LEFT ATRIAL APPENDAGE OCCLUSION;  Surgeon: Boyce Byes, MD;  Location: [facility] INVASIVE CV LAB;  Service: Cardiovascular;  Laterality: N/A;   LEFT HEART CATH AND CORS/GRAFTS ANGIOGRAPHY N/A 10/25/2018   Procedure: LEFT HEART CATH AND CORS/GRAFTS ANGIOGRAPHY;  Surgeon: Arleen Lacer, MD;  Location: [facility] INVASIVE CV LAB;  Service: Cardiovascular;  Laterality: N/A;   LEFT HEART CATH AND CORS/GRAFTS ANGIOGRAPHY N/A 12/03/2021   Procedure: LEFT HEART CATH AND CORS/GRAFTS ANGIOGRAPHY;  Surgeon: Swaziland, [name], MD;  Location: [facility] INVASIVE CV LAB;  Service: Cardiovascular;  Laterality: N/A;   POSTERIOR FUSION CERVICAL SPINE  2006   TEE WITHOUT CARDIOVERSION N/A 03/30/2015   Procedure: TRANSESOPHAGEAL ECHOCARDIOGRAM (TEE);   Surgeon: Norita Beauvais, MD;  Location: [facility] OR;  Service: Open Heart Surgery;  Laterality: N/A;   TESTICLE TORSION REDUCTION Right 1960   TOE FUSION Left 1985 & 2004   "great toe" and removal   TONSILLECTOMY     TOTAL HIP ARTHROPLASTY Right 10/27/2015   Procedure: RIGHT TOTAL HIP ARTHROPLASTY ANTERIOR APPROACH and steroid injection right foot;  Surgeon: Arnie Lao, MD;  Location: [facility] OR;  Service: Orthopedics;  Laterality: Right;   TOTAL HIP ARTHROPLASTY Left 08/13/2019   TOTAL HIP ARTHROPLASTY Left 08/13/2019   Procedure: LEFT TOTAL HIP ARTHROPLASTY ANTERIOR APPROACH;  Surgeon: Arnie Lao, MD;  Location: [facility] OR;  Service: Orthopedics;  Laterality: Left;   TRANSESOPHAGEAL ECHOCARDIOGRAM (CATH LAB) N/A 07/13/2023   Procedure: TRANSESOPHAGEAL ECHOCARDIOGRAM;  Surgeon: Boyce Byes, MD;  Location: [facility] INVASIVE CV LAB;  Service: Cardiovascular;  Laterality: N/A;   Patient Active Problem List   Diagnosis Date Noted   Refractory migraine 08/15/2023   Bursitis of right hip 08/12/2023   Hyperglycemia due to type 2 diabetes mellitus (HCC) 08/09/2023   Atrial fibrillation (HCC) 07/13/2023   Presence of Watchman left atrial appendage closure device 07/13/2023   Encounter for well adult exam with abnormal findings 06/28/2023   Cough 05/20/2023   Smoker 12/27/2022   Acute pain 12/22/2022   Cervical spondylosis without myelopathy 12/22/2022   Cervicogenic headache 12/22/2022   Medication overuse headache 12/22/2022   Hyperlipidemia 12/22/2022   Idiopathic peripheral neuropathy 12/22/2022   Prediabetes 12/22/2022   Cervicalgia 10/19/2022   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 10/14/2022   Right inguinal hernia 10/05/2022   Age-related nuclear cataract, bilateral 09/23/2022   Cysts of right upper eyelid 09/23/2022   Other specified disorders of eyelid 09/23/2022   Exposure to potentially hazardous substance 09/23/2022   Atrial flutter (HCC) 09/15/2022   Skin lesion  06/28/2022   Pain of left scapula 04/20/2022   Unstable angina (HCC) 12/02/2021   Intractable chronic migraine without aura and without status migrainosus 11/11/2021   Vitamin D  deficiency 06/13/2021   Encounter for fitting and adjustment of hearing aid 11/26/2020   Encounter for fitting and adjustment of spectacles and contact lenses 11/26/2020   DNR (do not resuscitate) discussion 11/26/2020   Aortic atherosclerosis (HCC) 11/26/2020   Allergic rhinitis 10/05/2020   Sensorineural hearing loss, bilateral 09/29/2020   Hallux rigidus 09/23/2020   Retinoschisis 09/23/2020   RUQ pain 06/10/2020   History of lacunar cerebrovascular accident 04/05/2020   Chronic right-sided headache 03/16/2020   Ankle pain 01/22/2020   Hypothyroidism 11/26/2019   Epididymitis 08/28/2019   Unilateral primary osteoarthritis, left hip 08/13/2019   Status post total replacement of left hip 08/13/2019   Abdominal wall hernia 11/02/2018   Progressive angina (HCC) 10/25/2018   DOE (dyspnea on exertion) 10/25/2018   Abscess of right buttock 11/27/2017   Pilon fracture 03/17/2017   Pilon fracture of left tibia 03/17/2017   At risk for adverse drug reaction 02/28/2017   Closed fracture dislocation of ankle joint, left, with delayed healing, subsequent encounter 02/22/2017   Renal stone 10/26/2016   Gross hematuria 10/26/2016   Bladder neck obstruction 10/26/2016   External hemorrhoid, thrombosed 06/10/2016   Internal hemorrhoids 06/10/2016   Lumbar back pain 05/19/2016   Osteoarthritis of right hip 10/27/2015   Chronic chest pain 09/22/2015   Acute gouty arthritis 06/11/2015   Coronary artery disease involving native coronary artery with angina pectoris (HCC) 06/11/2015   Chronic diastolic CHF (congestive heart failure) (HCC) 06/11/2015   H/O tissue AVR Oct 2016 06/10/2015   Paroxysmal atrial fibrillation (HCC) 06/10/2015   Dyslipidemia 06/10/2015   S/P CABG x 4-Oct 2016 03/30/2015   H/O NSTEMI-Oct 2016  03/27/2015   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)    Depression 09/08/2014   GERD (gastroesophageal reflux disease)    Essential tremor 03/31/2014   Musculoskeletal pain 01/01/2014   Spondylolisthesis at L4-L5 level 01/01/2014   Sinus bradycardia 12/31/2013   Greater trochanteric bursitis of right hip 12/04/2013   OSA on CPAP    Diet-controlled diabetes mellitus (HCC) 07/08/2009   History of colonic polyps 12/31/2008   GOUT 12/22/2008   Essential hypertension 12/22/2008   Diverticulosis of colon 12/22/2008   NEPHROLITHIASIS, HX OF 12/22/2008    PCP: Roslyn Coombe, MD    REFERRING PROVIDER:  Persons, Norma Beckers, [place]   REFERRING DIAG:  Diagnosis  M25.551 (ICD-10-CM) - Pain in right hip    THERAPY DIAG:  Right hip pain  Muscle weakness (generalized)  Difficulty in walking, not elsewhere classified  Other low back pain  Rationale for Evaluation and Treatment: Rehabilitation  ONSET DATE: 3-4 months progressively getting worse  SUBJECTIVE:    SUBJECTIVE STATEMENT Lesia Raring is limited by Left > right left gluteal pain.  His previous left ankle fusion also limits his WB function.  PERTINENT HISTORY: THA on left 08/13/2019, THA on Rt 10/27/2015, A-fib, Cardiac valve replacement, ankle fusion 03/17/2017  PAIN:  NPRS scale: 3-5/10 this week Pain location: Lt > Rt gluteal pain Pain description: Achy, throbbing, soreness, stiffness Aggravating factors: Standing, transfers, walking, prolonged postures Relieving factors: Resting, changing positions helps some but doesn't completely resolve  PRECAUTIONS: none  WEIGHT BEARING RESTRICTIONS: No  FALLS:  Has patient fallen in last 6 months? No  LIVING ENVIRONMENT: Lives with: lives with their family and lives with their spouse Lives in: House/apartment  Has following equipment at home: Single point cane, Environmental consultant - 2 wheeled, and shower chair  OCCUPATION: retired  PLOF: Independent  PATIENT  GOALS: walk to my grandchild's graduation.   Next MD visit:   OBJECTIVE:   PATIENT SURVEYS:  Patient-Specific Activity Scoring Scheme  "0" represents "unable to perform." "10" represents "able to perform at prior level. 0 1 2 3 4 5 6 7 8 9  10 (Date and Score)   Activity Eval  08/28/23 10/09/23    1. walking  5  4  2. dressing  7  8  3. Preparing a meal 4 10  4.    5.    Score 16/3= 5.3 22/3=7.3   Total score = sum of the activity scores/number of activities Minimum detectable change (90%CI) for average score = 2 points Minimum detectable change (90%CI) for single activity score = 3 points  COGNITION: Overall cognitive status: WFL    SENSATION: Number and tingling down Rt LE into     POSTURE:  rounded shoulders, forward head, and decreased lumbar lordosis  PALPATION: TTP: Rt vastus intermedius  LOWER EXTREMITY ROM:   ROM Right Eval Active supine Left Eval Active supine Left/Right 10/19/2023  Hip flexion 95 104 100/100  Hip extension     Hip abduction     Hip adduction     Hip internal rotation   6/8  Hip external rotation   32/43  Knee flexion 125 112   Knee extension -6 -6   Ankle dorsiflexion     Ankle plantarflexion     Ankle inversion     Ankle eversion     Hamstrings   35/35   (Blank rows = not tested)  LOWER EXTREMITY MMT:  MMT Right eval Left eval Right 09/19/2023 Left 09/19/2023 Left/Right 10/19/2023  Hip flexion 5 5 4+/5 5/5   Hip extension       Hip abduction 3 3   4/4  Hip adduction 3 3     Hip internal rotation 3 3 4/5 4/5   Hip external rotation 3 3 4/5 4/5   Knee flexion       Knee extension       Ankle dorsiflexion       Ankle plantarflexion       Ankle inversion       Ankle eversion        (Blank rows = not tested)    FUNCTIONAL TESTS:  08/28/23: 5  time sit to stand: 27.6 seconds c UE support 09/27/23: 5 time sit to stand:13.3 seconds c UE support  5 time sit to stand:13.6 seconds c no UE support    GAIT:  08/28/23 Distance walked: 30 feet clinic distances level surfaces Assistive device utilized: Single point cane Level of assistance: Modified independence Comments: forward flexed posture, increased bilateral  knee flexion during stance phase, decreased heel to toe strike bilaterally and decreased Dorsiflexion and mobility in left ankle.                                                                                                                                                                        TODAY'S TREATMENT                                                                          DATE: 10/19/2023 Hamstrings stretch supine 4 x 20 seconds bilateral Yoga bridge 10 x 5 seconds  Neuromuscular re-education: Tandem balance eyes open progressing from wider to narrower stance 5 x 20 seconds bilateral  Functional Activities: Step down off 4 and 6 inch step right and 2 inch step left (shorter step due to left ankle fusion) 2 sets of 10 bilateral with focus on posture and slow eccentrics Sit to stand with slow eccentric 5 times Reviewed emphasis of home exercise program on hip abductors strengthening including activities addressed today  TODAY'S TREATMENT                                                                          DATE: 10/09/2023 Therex:  Seated hamstring stretch x 3 bil holidng 30 sec Supine: bridges: x 10, Supine bridges c ball squeeze 2 x 10  Supine SLR 2 x 10 bil LE TherActivites:  Mini squats: x 10 c UE support Sit to stand 3 x 5 (see best times from above for 5 time sit to stand) Leg Press: bil LE's 75#  2 x 10, single LE: 31# x 10 bil LE NMR:  Tandem stance x 2 each LE holding 30 sec no UE support Standing feet together x 2 holding 30 sec c supervision Standing feet together eyes closed x 30 sec c close  supervision   TODAY'S TREATMENT                                                                          DATE: 10/04/2023 Therex:  Nustep: 8 minutes, level 5  UE/LE Supine bridges 5 sec hold 2X10 Supine SLR 2X10 bilat Supine hamstring stretch 30 sec X 2 bilat   TherActivites:  Standing hip abductions green 2X10 Standing hip extensions green 2X10 Step ups on 6 inch step with single UE support  x 10  Sit to stand 2 x 5 without UE support Leg Press: bil LE's 75#  2 x 10, single LE: 37# 2 x 10 bil LE  NMR:  Tandem stance x 2 each LE holding 30 sec no UE support Standing feet together x 2 holding 30 sec X 1 with head turns, 30 sec X 1 with head nods Standing feet together eyes closed x 30 sec c close supervision   PATIENT EDUCATION:  Education details: HEP, POC Person educated: Patient Education method: Programmer, multimedia, Demonstration, Verbal cues, and Handouts Education comprehension: verbalized understanding, returned demonstration, and verbal cues required  HOME EXERCISE PROGRAM: Access Code: [email or web address] URL: [email or web address] Date: 10/19/2023 Prepared by: Terral Ferrari  Exercises - Supine Lower Trunk Rotation  - 1 x daily - 1 x weekly - 4 reps - 20 seconds hold - Supine March  - 2 x daily - 1 x weekly - 20 reps - Supine Hip Adduction Isometric with Ball  - 2 x daily - 1 x weekly - 2 sets - 10 reps - 5 seconds hold - Clamshell with Resistance  - 2 x daily - 7 x weekly - 2 sets - 10 reps - 3 seconds hold - Yoga Bridge  - 2 x daily - 7 x weekly - 2 sets - 10 reps - 5 seconds hold - Tandem Stance  - 1 x daily - 7 x weekly - 1 sets - 5 reps - 20 second hold - Sit to Stand Without Arm Support  - 2 x daily - 7 x weekly - 1 sets - 5 reps - Standing Lumbar Extension at Wall - Forearms  - 5 x daily - 7 x weekly - 1 sets - 5 reps - 3 seconds hold - Standing Scapular Retraction  - 5 x daily - 7 x weekly - 1 sets - 5 reps - 5 second hold - Supine Hamstring Stretch  - 1-2 x daily - 7 x weekly - 1 sets - 5 reps - 20 seconds hold - Lateral Step Down  - 1 x daily - 3 x weekly - 2 sets - 10 reps  ASSESSMENT:  CLINICAL  IMPRESSION: Lesia Raring notes his bilateral gluteals have been most functionally limiting over the last week.  Hip active range of motion and flexibility assessment shows that he is actually moving well with the exception of some bilateral hamstrings tightness.  Bilateral hip abductors and particularly are weak and would help explain his lateral gluteal pain with prolonged standing and weight-bearing.  We reviewed and made slight modifications to Chuck's home exercises to emphasize areas that needed a bit more tension.  Continue emphasis on proximal leg and hip musculature to improve Chuck's standing and walking ability and improve  weight-bearing function.   OBJECTIVE IMPAIRMENTS: Abnormal gait, decreased balance, decreased mobility, difficulty walking, decreased ROM, decreased strength, impaired flexibility, and pain.   ACTIVITY LIMITATIONS: lifting, bending, sitting, standing, squatting, sleeping, stairs, transfers, and dressing  PARTICIPATION LIMITATIONS: meal prep and community activity  PERSONAL FACTORS: 3+ comorbidities: see PMH above are also affecting patient's functional outcome.   REHAB POTENTIAL: Good  CLINICAL DECISION MAKING: Evolving/moderate complexity  EVALUATION COMPLEXITY: Moderate   GOALS: Goals reviewed with patient? Yes  SHORT TERM GOALS: (target date for Short term goals are 3 weeks 09/18/2023)   1.  Patient will demonstrate independent use of home exercise program to maintain progress from in clinic treatments.  Goal status: Met 10/19/2023 LONG TERM GOALS: (target dates for all long term goals are 10 weeks  11/10/2023 )   1. Patient will demonstrate/report pain at worst less than or equal to 2/10 to facilitate minimal limitation in daily activity secondary to pain symptoms.  Goal status: Ongoing 10/19/2023   2. Patient will demonstrate independent use of home exercise program to facilitate ability to maintain/progress functional gains from skilled physical therapy  services.  Goal status: New   3. Patient will demonstrate Patient specific functional scale avg > or = 7.3 to indicate reduced disability due to condition.   Goal status: New   4.  Patient will demonstrate bil LE MMT >/= 4/5 throughout to faciltiate usual transfers, stairs, squatting at [facility] for daily life.   Goal status: Ongoing 10/19/2023   5.  Patient will demonstrate 5 time sit to stand </= 15 seconds c/s UE support.  Goal status: New   6.  Pt will be able to walk >/= 500 feet on community surfaces with LRAD.  Goal status: New      PLAN:  PT FREQUENCY: 1-2x/week  PT DURATION: 10 weeks  PLANNED INTERVENTIONS: Can include 16109- PT Re-evaluation, 97110-Therapeutic exercises, 97530- Therapeutic activity, [id number]- Neuromuscular re-education, 97535- Self Care, 97140- Manual therapy, [phone]- Gait training, [phone]- Orthotic Fit/training, [phone]- Canalith repositioning, [id number]- Aquatic Therapy, [phone]- Electrical stimulation (unattended), [phone]- Electrical stimulation (manual), [id number] Physical performance testing, 97016- Vasopneumatic device, [id number]- Ultrasound, [id number]- Traction (mechanical), [id number]- Ionotophoresis 4mg /ml Dexamethasone , Patient/Family education, Balance training, Stair training, Taping, Dry Needling, Joint mobilization, Joint manipulation, Spinal manipulation, Spinal mobilization, Scar mobilization, Vestibular training, Visual/preceptual remediation/compensation, DME instructions, Cryotherapy, and Moist heat.  All performed as medically necessary.  All included unless contraindicated  PLAN FOR NEXT SESSION: Low back, hip abductors and general proximal lower extremity strengthening, along with activities to improve weight-bearing endurance.  Keep in mind his left ankle fusion when prescribing activities.  Okey Berg [place], PT, MPT 10/19/23 5:48 PM   10/19/23 5:48 PM   10/19/23 5:48 PM     Date of referral: 3/725 Referring provider: Persons, Norma Beckers, [place]  Referring  diagnosis? REFERRING DIAG:  Diagnosis  M25.551 (ICD-10-CM) - Pain in right hip   Treatment diagnosis? (if different than referring diagnosis) M25.551, M62.81, R26.2, M54.59  What was this (referring dx) caused by? Ongoing Issue  Lonne Roan of Condition: Initial Onset (within last 3 months)   Laterality: Both  Current Functional Measure Score: Other 5.3: pt specific activity score  Objective measurements identify impairments when they are compared to normal values, the uninvolved extremity, and prior level of function.  [x]  Yes  []  No  Objective assessment of functional ability: Moderate functional limitations   Briefly describe symptoms: Rt and left hip pain, c Rt being worse  How did symptoms start: over time and worsening over the  last 3-4 months. Pt c history of bilateral hip replacements.   Average pain intensity:  Last 24 hours: 8/10 mostly Rt hip  Past week: can reach 8/10 at times, 4/10 at the lowest during the last week .   How often does the pt experience symptoms? Constantly  How much have the symptoms interfered with usual daily activities? Quite a bit  How has condition changed since care began at this facility? NA - initial visit  In general, how is the patients overall health? Fair   BACK PAIN (STarT Back Screening Tool) Has pain spread down the leg(s) at some time in the last 2 weeks? yes Has there been pain in the shoulder or neck at some time in the last 2 weeks? Yes, Rt shoulder Has the pt only walked short distances because of back pain? yes Has patient dressed more slowly because of back pain in the past 2 weeks? no Does patient think it's not safe for a person with this condition to be physically active? no Does patient have worrying thoughts a lot of the time? no Does patient feel back pain is terrible and will never get any better? no Has patient stopped enjoying things they usually enjoy? yes

## 2023-10-22 ENCOUNTER — Ambulatory Visit: Payer: Self-pay | Admitting: Cardiology

## 2023-10-23 ENCOUNTER — Encounter: Payer: Self-pay | Admitting: Dietician

## 2023-10-23 ENCOUNTER — Encounter: Payer: Medicare Other | Attending: Internal Medicine | Admitting: Dietician

## 2023-10-23 DIAGNOSIS — E1165 Type 2 diabetes mellitus with hyperglycemia: Secondary | ICD-10-CM | POA: Diagnosis not present

## 2023-10-23 DIAGNOSIS — E119 Type 2 diabetes mellitus without complications: Secondary | ICD-10-CM | POA: Diagnosis not present

## 2023-10-23 DIAGNOSIS — E039 Hypothyroidism, unspecified: Secondary | ICD-10-CM | POA: Diagnosis not present

## 2023-10-23 NOTE — Progress Notes (Signed)
 Diabetes Self-Management Education  Visit Type: Follow-up  Appt. Start Time: 1405 Appt. End Time: 1450  10/23/2023  Mr. Troy Banks, identified by name and date of birth, is a 83 y.o. male with a diagnosis of Diabetes:  .   ASSESSMENT Pt wife, Adell Hones, present for appointment. Pt spouse reports Ozempic has been increased to 0.5 mg, reports weight loss of ~20 lbs since starting Ozempic, most recent A1c was 6.6%. Pt reports lowered appetite and decreased interest in food with Ozempic, wife states it can be tough for pt to eat 2 meals daily, maybe will eat 1 one meal and snack on PB crackers sometimes. Pt reports portion control has improved, and they stop eating when beginning to feel full. Pt reports continuing to check FBG daily, numbers are much more consistently under 130 mg/dL. Pt spouse pt is taking PT 1-2 times per week, pt states they are more mobile now but will still have hip pain if walking for too long.   Diabetes Self-Management Education - 10/23/23 1507       Visit Information   Visit Type Follow-up      Psychosocial Assessment   Other persons present Spouse/SO      Pre-Education Assessment   Patient understands the diabetes disease and treatment process. Comprehends key points    Patient understands incorporating nutritional management into lifestyle. Comprehends key points    Patient undertands incorporating physical activity into lifestyle. Comprehends key points    Patient understands using medications safely. Comprehends key points    Patient understands monitoring blood glucose, interpreting and using results Comprehends key points    Patient understands prevention, detection, and treatment of acute complications. Comprehends key points    Patient understands prevention, detection, and treatment of chronic complications. Compreheands key points    Patient understands how to develop strategies to address psychosocial issues. Comprehends key points    Patient  understands how to develop strategies to promote health/change behavior. Comprehends key points      Complications   Last HgB A1C per patient/outside source 6.6 %    How often do you check your blood sugar? 1-2 times/day    Fasting Blood glucose range (mg/dL) 16-109;604-540   Occasionally elevated FBG, very well controlled     Dietary Intake   Breakfast Bacon egg and cheese biscuit, Large coffee    Dinner Outback chicken breast, 1/2 baked potato, broccoli, Unsweet tea    Beverage(s) Coffee, unsweet tea      Activity / Exercise   Activity / Exercise Type ADL's;Other (comment)   Physical Therapy   How many days per week do you exercise? 2    How many minutes per day do you exercise? 45    Total minutes per week of exercise 90      Patient Education   Medications Reviewed patients medication for diabetes, action, purpose, timing of dose and side effects.    Monitoring Identified appropriate SMBG and/or A1C goals.      Individualized Goals (developed by patient)   Nutrition General guidelines for healthy choices and portions discussed    Physical Activity Not Applicable    Medications take my medication as prescribed    Monitoring  Test my blood glucose as discussed;Send in my blood glucose log as discussed      Patient Self-Evaluation of Goals - Patient rates self as meeting previously set goals (% of time)   Nutrition 50 - 75 % (half of the time)    Physical Activity Not Applicable  Medications >75% (most of the time)    Monitoring >75% (most of the time)    Problem Solving and behavior change strategies  >75% (most of the time)    Reducing Risk (treating acute and chronic complications) >75% (most of the time)    Health Coping >75% (most of the time)      Post-Education Assessment   Patient understands the diabetes disease and treatment process. Demonstrates understanding / competency    Patient understands incorporating nutritional management into lifestyle. Demonstrates  understanding / competency    Patient undertands incorporating physical activity into lifestyle. N/A    Patient understands using medications safely. Demonstrates understanding / competency    Patient understands monitoring blood glucose, interpreting and using results Demonstrates understanding / competency    Patient understands prevention, detection, and treatment of acute complications. Comprehends key points    Patient understands prevention, detection, and treatment of chronic complications. Demonstrates understanding / competency    Patient understands how to develop strategies to address psychosocial issues. Demonstrates understanding / competency    Patient understands how to develop strategies to promote health/change behavior. Demonstrates understanding / competency      Outcomes   Expected Outcomes Demonstrated interest in learning. Expect positive outcomes    Future DMSE PRN    Program Status Completed      Subsequent Visit   Since your last visit have you continued or begun to take your medications as prescribed? Yes    Since your last visit have you had your blood pressure checked? Yes    Is your most recent blood pressure lower, unchanged, or higher since your last visit? Higher    Since your last visit have you experienced any weight changes? Loss    Weight Loss (lbs) 12    Since your last visit, are you checking your blood glucose at least once a day? Yes             Individualized Plan for Diabetes Self-Management Training:   Learning Objective:  Patient will have a greater understanding of diabetes self-management. Patient education plan is to attend individual and/or group sessions per assessed needs and concerns.   Plan:   Patient Instructions  Work on trying to incorporate protein snacks during the afternoon. Have a spoonful of PB or have some bologna and low fat cheese (swiss, mozzarella, reduced fat cheddar).  Look for weight loss of 1-2 pounds per week  and try to maintain your weight around 175 lbs when you get there.  If having rice, try to keep your portion to 1/2 cup to no more than 1 cup.  Try visiting [employer] on the corner of [address]!  Expected Outcomes:  Demonstrated interest in learning. Expect positive outcomes  Education material provided: Snack sheet, Protein Foods list  If problems or questions, patient to contact team via:  Phone and Email  Future DSME appointment: PRN

## 2023-10-23 NOTE — Patient Instructions (Addendum)
 Work on trying to incorporate protein snacks during the afternoon. Have a spoonful of PB or have some bologna and low fat cheese (swiss, mozzarella, reduced fat cheddar).  Look for weight loss of 1-2 pounds per week and try to maintain your weight around 175 lbs when you get there.  If having rice, try to keep your portion to 1/2 cup to no more than 1 cup.  Try visiting [employer] [facility] on the corner of [address]!

## 2023-10-25 ENCOUNTER — Encounter: Payer: Self-pay | Admitting: Rehabilitative and Restorative Service Providers"

## 2023-10-25 ENCOUNTER — Ambulatory Visit: Admitting: Rehabilitative and Restorative Service Providers"

## 2023-10-25 DIAGNOSIS — M25551 Pain in right hip: Secondary | ICD-10-CM

## 2023-10-25 DIAGNOSIS — R262 Difficulty in walking, not elsewhere classified: Secondary | ICD-10-CM

## 2023-10-25 DIAGNOSIS — M5459 Other low back pain: Secondary | ICD-10-CM

## 2023-10-25 DIAGNOSIS — M6281 Muscle weakness (generalized): Secondary | ICD-10-CM

## 2023-10-25 NOTE — Therapy (Signed)
 OUTPATIENT PHYSICAL THERAPY LOWER EXTREMITY TREATMENT NOTE  Patient Name: Troy Banks MRN: [id number] DOB:Sep 15, 1940, 83 y.o., male Today's Date: 10/25/2023  END OF SESSION:  PT End of Session - 10/25/23 1515     Visit Number 10    Number of Visits 20    Date for PT Re-Evaluation 11/10/23    Authorization Type [employer] Medicare    Progress Note Due on Visit 10    PT Start Time 1433    PT Stop Time 1515    PT Time Calculation (min) 42 min    Activity Tolerance Patient tolerated treatment well;No increased pain;Patient limited by pain    Behavior During Therapy [facility] for tasks assessed/performed               Past Medical History:  Diagnosis Date   Allergy    Anxiety    PTSD   Aortic stenosis, moderate 03/27/2015   post AVR echo Echo 11/16: Mild LVH, EF 55-60%, normal wall motion, grade 1 diastolic dysfunction, bioprosthetic AVR okay (mean gradient 18 mmHg) with trivial AI, mild LAE, atrial septal aneurysm, small effusion    Arthritis    "qwhere" (10/29/2015)   Cancer (HCC)    skin cancer   Carotid artery stenosis    Carotid US  11/17: 1-39% bilateral ICA stenosis. F/u prn   Cervical stenosis of spinal canal    fusion 2006   CHF (congestive heart failure) (HCC)    "secondry to [facility]"   Closed left pilon fracture    COLONIC POLYPS, HX OF    Complication of anesthesia    "takes a lot to put him under and has woken up during surgery" Difficult to wake up . PTSD; "does not metabolize RX well"that he gets "violent", per pt.    Coronary artery disease    Depression    Difficult intubation 03/30/2015   Glidescop and 4 used 10/28/22, 04/07/23 due to history   DIVERTICULOSIS, COLON    Dyspnea    Dysrhythmia    2 bouts of afib post op and at home but corrected with medication.   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 Marven Slimmer   PTSD (post-traumatic stress disorder)    PTSD (post-traumatic stress disorder)    Sleep apnea    On a CPAP   Past Surgical History:  Procedure Laterality Date   ANKLE FUSION Left 03/17/2017   Procedure: FUSION  PILON FRACTURE WITH BONE GRAFTING;  Surgeon: Laneta Pintos, MD;  Location: [facility] OR;  Service: Orthopedics;  Laterality: Left;   ANTERIOR FUSION CERVICAL SPINE  2002   AORTIC VALVE REPLACEMENT N/A 03/30/2015  Procedure: AORTIC VALVE REPLACEMENT (AVR);  Surgeon: Norita Beauvais, MD;  Location: [facility] OR;  Service: Open Heart Surgery;  Laterality: N/A;   ATRIAL FIBRILLATION ABLATION N/A 04/07/2023   Procedure: ATRIAL FIBRILLATION ABLATION;  Surgeon: Boyce Byes, MD;  Location: [facility] INVASIVE CV LAB;  Service: Cardiovascular;  Laterality: N/A;   CARDIAC CATHETERIZATION N/A 03/26/2015   Procedure: Left Heart Cath and Coronary Angiography;  Surgeon: Arleen Lacer, MD;  Location: [facility] INVASIVE CV LAB;  Service: Cardiovascular;  Laterality: N/A;   CARDIAC VALVE REPLACEMENT     CHOLECYSTECTOMY N/A 10/28/2022   Procedure: LAPAROSCOPIC CHOLECYSTECTOMY WITH INTRAOPERATIVE CHOLANGIOGRAM;  Surgeon: Jacolyn Matar, MD;  Location: [facility] ORS;  Service: General;  Laterality: N/A;   COLONOSCOPY W/ POLYPECTOMY     CORONARY ARTERY BYPASS GRAFT N/A 03/30/2015   Procedure: CORONARY ARTERY BYPASS GRAFTING (CABG) x four, using left internal mammary artery  and left    leg greater saphenous vein harvested endoscopically ;  Surgeon: Norita Beauvais, MD;  Location: [facility] OR;  Service: Open Heart Surgery;  Laterality: N/A;   CORONARY/GRAFT ANGIOGRAPHY N/A 12/03/2021   Procedure: CORONARY/GRAFT ANGIOGRAPHY;  Surgeon: Swaziland, [name], MD;  Location: [facility] INVASIVE CV LAB;  Service: Cardiovascular;  Laterality: N/A;   CYSTOSCOPY/URETEROSCOPY/HOLMIUM LASER/STENT PLACEMENT Bilateral 11/07/2016   Procedure: CYSTOSCOPY/URETEROSCOPY/HOLMIUM LASER/STENT PLACEMENT;  Surgeon: Bart Born, MD;  Location: [facility] ORS;  Service: Urology;  Laterality: Bilateral;   CYSTOSCOPY/URETEROSCOPY/HOLMIUM LASER/STENT PLACEMENT Bilateral 11/23/2016   Procedure: CYSTOSCOPY/BILATERAL URETEROSCOPY/HOLMIUM LASER/STENT EXCHANGE;  Surgeon: Bart Born, MD;  Location: [facility] ORS;  Service: Urology;  Laterality: Bilateral;  NEEDS 90 MIN    EXTERNAL FIXATION LEG Left 02/22/2017   Procedure: EXTERNAL FIXATION LEFT ANKLE;  Surgeon: Saundra Curl, MD;  Location: [facility] OR;  Service: Orthopedics;  Laterality: Left;   HEMORRHOID BANDING  1970s   INGUINAL HERNIA REPAIR Left 1962   KIDNEY STONE SURGERY  1986   "cut me open"   LEFT ATRIAL APPENDAGE OCCLUSION N/A 07/13/2023   Procedure: LEFT ATRIAL APPENDAGE OCCLUSION;  Surgeon: Boyce Byes, MD;  [facility]: [facility] INVASIVE CV LAB;  Service: Cardiovascular;  Laterality: N/A;   LEFT HEART CATH AND CORS/GRAFTS ANGIOGRAPHY N/A 10/25/2018   Procedure: LEFT HEART CATH AND CORS/GRAFTS ANGIOGRAPHY;  Surgeon: Arleen Lacer, MD;  Location: [facility] INVASIVE CV LAB;  Service: Cardiovascular;  Laterality: N/A;   LEFT HEART CATH AND CORS/GRAFTS ANGIOGRAPHY N/A 12/03/2021   Procedure: LEFT HEART CATH AND CORS/GRAFTS ANGIOGRAPHY;  Surgeon: Swaziland, [name], MD;  Location: [facility] INVASIVE CV LAB;  Service: Cardiovascular;  Laterality: N/A;   POSTERIOR FUSION CERVICAL SPINE  2006   TEE WITHOUT CARDIOVERSION N/A 03/30/2015   Procedure: TRANSESOPHAGEAL ECHOCARDIOGRAM (TEE);   Surgeon: Norita Beauvais, MD;  Location: [facility] OR;  Service: Open Heart Surgery;  Laterality: N/A;   TESTICLE TORSION REDUCTION Right 1960   TOE FUSION Left 1985 & 2004   "great toe" and removal   TONSILLECTOMY     TOTAL HIP ARTHROPLASTY Right 10/27/2015   Procedure: RIGHT TOTAL HIP ARTHROPLASTY ANTERIOR APPROACH and steroid injection right foot;  Surgeon: Arnie Lao, MD;  Location: [facility] OR;  Service: Orthopedics;  Laterality: Right;   TOTAL HIP ARTHROPLASTY Left 08/13/2019   TOTAL HIP ARTHROPLASTY Left 08/13/2019   Procedure: LEFT TOTAL HIP ARTHROPLASTY ANTERIOR APPROACH;  Surgeon: Arnie Lao, MD;  Location: [facility] OR;  Service: Orthopedics;  Laterality: Left;   TRANSESOPHAGEAL ECHOCARDIOGRAM (CATH LAB) N/A 07/13/2023   Procedure: TRANSESOPHAGEAL ECHOCARDIOGRAM;  Surgeon: Boyce Byes, MD;  Location: [facility] INVASIVE CV LAB;  Service: Cardiovascular;  Laterality: N/A;   Patient Active Problem List   Diagnosis Date Noted   Refractory migraine 08/15/2023   Bursitis of right hip 08/12/2023   Hyperglycemia due to type 2 diabetes mellitus (HCC) 08/09/2023   Atrial fibrillation (HCC) 07/13/2023   Presence of Watchman left atrial appendage closure device 07/13/2023   Encounter for well adult exam with abnormal findings 06/28/2023   Cough 05/20/2023   Smoker 12/27/2022   Acute pain 12/22/2022   Cervical spondylosis without myelopathy 12/22/2022   Cervicogenic headache 12/22/2022   Medication overuse headache 12/22/2022   Hyperlipidemia 12/22/2022   Idiopathic peripheral neuropathy 12/22/2022   Prediabetes 12/22/2022   Cervicalgia 10/19/2022   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 10/14/2022   Right inguinal hernia 10/05/2022   Age-related nuclear cataract, bilateral 09/23/2022   Cysts of right upper eyelid 09/23/2022   Other specified disorders of eyelid 09/23/2022   Exposure to potentially hazardous substance 09/23/2022   Atrial flutter (HCC) 09/15/2022   Skin lesion  06/28/2022   Pain of left scapula 04/20/2022   Unstable angina (HCC) 12/02/2021   Intractable chronic migraine without aura and without status migrainosus 11/11/2021   Vitamin D  deficiency 06/13/2021   Encounter for fitting and adjustment of hearing aid 11/26/2020   Encounter for fitting and adjustment of spectacles and contact lenses 11/26/2020   DNR (do not resuscitate) discussion 11/26/2020   Aortic atherosclerosis (HCC) 11/26/2020   Allergic rhinitis 10/05/2020   Sensorineural hearing loss, bilateral 09/29/2020   Hallux rigidus 09/23/2020   Retinoschisis 09/23/2020   RUQ pain 06/10/2020   History of lacunar cerebrovascular accident 04/05/2020   Chronic right-sided headache 03/16/2020   Ankle pain 01/22/2020   Hypothyroidism 11/26/2019   Epididymitis 08/28/2019   Unilateral primary osteoarthritis, left hip 08/13/2019   Status post total replacement of left hip 08/13/2019   Abdominal wall hernia 11/02/2018   Progressive angina (HCC) 10/25/2018   DOE (dyspnea on exertion) 10/25/2018   Abscess of right buttock 11/27/2017   Pilon fracture 03/17/2017   Pilon fracture of left tibia 03/17/2017   At risk for adverse drug reaction 02/28/2017   Closed fracture dislocation of ankle joint, left, with delayed healing, subsequent encounter 02/22/2017   Renal stone 10/26/2016   Gross hematuria 10/26/2016   Bladder neck obstruction 10/26/2016   External hemorrhoid, thrombosed 06/10/2016   Internal hemorrhoids 06/10/2016   Lumbar back pain 05/19/2016   Osteoarthritis of right hip 10/27/2015   Chronic chest pain 09/22/2015   Acute gouty arthritis 06/11/2015   Coronary artery disease involving native coronary artery with angina pectoris (HCC) 06/11/2015   Chronic diastolic CHF (congestive heart failure) (HCC) 06/11/2015   H/O tissue AVR Oct 2016 06/10/2015   Paroxysmal atrial fibrillation (HCC) 06/10/2015   Dyslipidemia 06/10/2015   S/P CABG x 4-Oct 2016 03/30/2015   H/O NSTEMI-Oct 2016  03/27/2015   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  REFERRING PROVIDER: Persons, Norma Beckers, [place]   REFERRING DIAG:  Diagnosis  M25.551 (ICD-10-CM) - Pain in right hip    THERAPY DIAG:  Right hip pain  Muscle weakness (generalized)  Difficulty in walking, not elsewhere classified  Other low back pain  Rationale for Evaluation and Treatment: Rehabilitation  ONSET DATE: 3-4 months progressively getting worse  SUBJECTIVE:    SUBJECTIVE STATEMENT Chuck notes being very stiff and sore after 3 straight days of 2.5 hour car rides and sitting through 2 college graduations.  Left > right left groin and gluteal pain are noted.  His previous left ankle fusion also limits his WB function.  PERTINENT HISTORY: THA on left 08/13/2019, THA on Rt 10/27/2015, A-fib, Cardiac valve replacement, ankle fusion 03/17/2017  PAIN:  NPRS scale: 2-6/10 this week Pain location: Lt < Rt gluteal pain this week and bilateral groin Pain description: Achy, throbbing, soreness, stiffness Aggravating factors: Standing, transfers, walking, prolonged postures Relieving factors: Resting, changing positions helps some but doesn't completely resolve  PRECAUTIONS: none  WEIGHT BEARING RESTRICTIONS: No  FALLS:  Has patient fallen in last 6 months? No  LIVING ENVIRONMENT: Lives with: lives with their family and lives with their  spouse Lives in: House/apartment  Has following equipment at home: Single point cane, Environmental consultant - 2 wheeled, and shower chair  OCCUPATION: retired  PLOF: Independent  PATIENT GOALS: walk to my grandchild's graduation.   Next MD visit:   OBJECTIVE:   PATIENT SURVEYS:  Patient-Specific Activity Scoring Scheme  "0" represents "unable to perform." "10" represents "able to perform at prior level. 0 1 2 3 4 5 6 7 8 9  10 (Date and Score)   Activity Eval  08/28/23 10/09/23    1. walking  5  4  2. dressing  7  8  3. Preparing a meal 4 10  4.    5.    Score 16/3= 5.3 22/3=7.3   Total score = sum of the activity scores/number of activities Minimum detectable change (90%CI) for average score = 2 points Minimum detectable change (90%CI) for single activity score = 3 points  COGNITION: Overall cognitive status: WFL    SENSATION: Number and tingling down Rt LE into    POSTURE:  rounded shoulders, forward head, and decreased lumbar lordosis  PALPATION: TTP: Rt vastus intermedius  LOWER EXTREMITY ROM:   ROM Right Eval Active supine Left Eval Active supine Left/Right 10/19/2023  Hip flexion 95 104 100/100  Hip extension     Hip abduction     Hip adduction     Hip internal rotation   6/8  Hip external rotation   32/43  Knee flexion 125 112   Knee extension -6 -6   Ankle dorsiflexion     Ankle plantarflexion     Ankle inversion     Ankle eversion     Hamstrings   35/35   (Blank rows = not tested)  LOWER EXTREMITY MMT:  MMT Right eval Left eval Right 09/19/2023 Left 09/19/2023 Left/Right 10/19/2023  Hip flexion 5 5 4+/5 5/5   Hip extension       Hip abduction 3 3   4/4  Hip adduction 3 3     Hip internal rotation 3 3 4/5 4/5   Hip external rotation 3 3 4/5 4/5   Knee flexion       Knee extension       Ankle dorsiflexion       Ankle plantarflexion       Ankle  inversion       Ankle eversion        (Blank rows = not tested)    FUNCTIONAL TESTS:   08/28/23: 5 time sit to stand: 27.6 seconds c UE support 09/27/23: 5 time sit to stand:13.3 seconds c UE support  5 time sit to stand:13.6 seconds c no UE support    GAIT: 08/28/23 Distance walked: 30 feet clinic distances level surfaces Assistive device utilized: Single point cane Level of assistance: Modified independence Comments: forward flexed posture, increased bilateral  knee flexion during stance phase, decreased heel to toe strike bilaterally and decreased Dorsiflexion and mobility in left ankle.                                                                                                                                                                        TODAY'S TREATMENT                                                                          DATE: 10/25/2023 Hamstrings stretch supine with PT assist 4 x 20 seconds bilateral Yoga bridge 10 x 5 seconds Side-lie clams with Black Thera-Band 10 x 3 seconds   Neuromuscular re-education: Tandem balance eyes open left in front of right and right just beside and 1/2 way in front of left (to address left ankle fusion discomfort) progressing from wider to narrower stance 5 x 20 seconds bilateral  Functional Activities: Step down & hip hike off 4 and 2 inch step (right and left side, respectively) 2 sets of 10 bilateral with focus on posture and slow eccentrics Sit to stand with slow eccentric 5 times   TODAY'S TREATMENT                                                                          DATE: 10/19/2023 Hamstrings stretch supine 4 x 20 seconds bilateral Yoga bridge 10 x 5 seconds  Neuromuscular re-education: Tandem balance eyes open progressing from wider to narrower stance 5 x 20 seconds bilateral  Functional Activities: Step down off 4 and 6 inch step right and 2 inch step left (shorter step due to left ankle fusion) 2 sets of 10 bilateral  with focus on posture and slow eccentrics Sit to stand with slow eccentric 5  times Reviewed emphasis of home exercise program on hip abductors strengthening including activities addressed today   TODAY'S TREATMENT                                                                          DATE: 10/09/2023 Therex:  Seated hamstring stretch x 3 bil holidng 30 sec Supine: bridges: x 10, Supine bridges c ball squeeze 2 x 10  Supine SLR 2 x 10 bil LE TherActivites:  Mini squats: x 10 c UE support Sit to stand 3 x 5 (see best times from above for 5 time sit to stand) Leg Press: bil LE's 75#  2 x 10, single LE: 31# x 10 bil LE NMR:  Tandem stance x 2 each LE holding 30 sec no UE support Standing feet together x 2 holding 30 sec c supervision Standing feet together eyes closed x 30 sec c close supervision    PATIENT EDUCATION:  Education details: HEP, POC Person educated: Patient Education method: Programmer, multimedia, Demonstration, Verbal cues, and Handouts Education comprehension: verbalized understanding, returned demonstration, and verbal cues required  HOME EXERCISE PROGRAM: Access Code: [email or web address] URL: [email or web address] Date: 10/19/2023 Prepared by: Terral Ferrari  Exercises - Supine Lower Trunk Rotation  - 1 x daily - 1 x weekly - 4 reps - 20 seconds hold - Supine March  - 2 x daily - 1 x weekly - 20 reps - Supine Hip Adduction Isometric with Ball  - 2 x daily - 1 x weekly - 2 sets - 10 reps - 5 seconds hold - Clamshell with Resistance  - 2 x daily - 7 x weekly - 2 sets - 10 reps - 3 seconds hold - Yoga Bridge  - 2 x daily - 7 x weekly - 2 sets - 10 reps - 5 seconds hold - Tandem Stance  - 1 x daily - 7 x weekly - 1 sets - 5 reps - 20 second hold - Sit to Stand Without Arm Support  - 2 x daily - 7 x weekly - 1 sets - 5 reps - Standing Lumbar Extension at Wall - Forearms  - 5 x daily - 7 x weekly - 1 sets - 5 reps - 3 seconds hold - Standing Scapular Retraction  - 5 x daily - 7 x weekly - 1 sets - 5 reps - 5 second hold - Supine Hamstring Stretch   - 1-2 x daily - 7 x weekly - 1 sets - 5 reps - 20 seconds hold - Lateral Step Down  - 1 x daily - 3 x weekly - 2 sets - 10 reps  ASSESSMENT:  CLINICAL IMPRESSION: Lesia Raring notes his groin was more painful with long car rides and prolonged sitting over the weekend.  Despite discomfort with prolonged postures, hip active range of motion and flexibility assessment shows that he is actually moving well with the exception of some bilateral hamstrings tightness.  Hip abductors weakness is contributing to his lateral gluteal pain along with prolonged postures.  We reviewed Chuck's home exercises to emphasize areas that will give him more benefit per unit of time spent on them.  Continue emphasis  on proximal leg and hip musculature AROM< flexibility and strength work to improve Chuck's standing and walking ability and improve weight-bearing function.   OBJECTIVE IMPAIRMENTS: Abnormal gait, decreased balance, decreased mobility, difficulty walking, decreased ROM, decreased strength, impaired flexibility, and pain.   ACTIVITY LIMITATIONS: lifting, bending, sitting, standing, squatting, sleeping, stairs, transfers, and dressing  PARTICIPATION LIMITATIONS: meal prep and community activity  PERSONAL FACTORS: 3+ comorbidities: see PMH above are also affecting patient's functional outcome.   REHAB POTENTIAL: Good  CLINICAL DECISION MAKING: Evolving/moderate complexity  EVALUATION COMPLEXITY: Moderate   GOALS: Goals reviewed with patient? Yes  SHORT TERM GOALS: (target date for Short term goals are 3 weeks 09/18/2023)   1.  Patient will demonstrate independent use of home exercise program to maintain progress from in clinic treatments.  Goal status: Met 10/19/2023 LONG TERM GOALS: (target dates for all long term goals are 10 weeks  11/10/2023 )   1. Patient will demonstrate/report pain at worst less than or equal to 2/10 to facilitate minimal limitation in daily activity secondary to pain  symptoms.  Goal status: Ongoing 10/25/2023   2. Patient will demonstrate independent use of home exercise program to facilitate ability to maintain/progress functional gains from skilled physical therapy services.  Goal status: New   3. Patient will demonstrate Patient specific functional scale avg > or = 7.3 to indicate reduced disability due to condition.   Goal status: New   4.  Patient will demonstrate bil LE MMT >/= 4/5 throughout to faciltiate usual transfers, stairs, squatting at [facility] for daily life.   Goal status: Ongoing 10/19/2023   5.  Patient will demonstrate 5 time sit to stand </= 15 seconds c/s UE support.  Goal status: New   6.  Pt will be able to walk >/= 500 feet on community surfaces with LRAD.  Goal status: New      PLAN:  PT FREQUENCY: 1-2x/week  PT DURATION: 10 weeks  PLANNED INTERVENTIONS: Can include 16109- PT Re-evaluation, 97110-Therapeutic exercises, 97530- Therapeutic activity, 97112- Neuromuscular re-education, [phone]- Self Care, 97140- Manual therapy, [phone]- Gait training, [phone]- Orthotic Fit/training, [phone]- Canalith repositioning, [id number]- Aquatic Therapy, [phone]- Electrical stimulation (unattended), [phone]- Electrical stimulation (manual), [id number] Physical performance testing, 97016- Vasopneumatic device, [id number]- Ultrasound, [id number]- Traction (mechanical), [id number]- Ionotophoresis 4mg /ml Dexamethasone , Patient/Family education, Balance training, Stair training, Taping, Dry Needling, Joint mobilization, Joint manipulation, Spinal manipulation, Spinal mobilization, Scar mobilization, Vestibular training, Visual/preceptual remediation/compensation, DME instructions, Cryotherapy, and Moist heat.  All performed as medically necessary.  All included unless contraindicated  PLAN FOR NEXT SESSION: Low back, hip abductors and general proximal lower extremity strengthening, along with activities to improve weight-bearing endurance.  Keep in mind his left ankle fusion when  prescribing activities.  PSFS and functional testing.  Okey Berg Grifton, PT, MPT 10/25/23 5:17 PM   10/25/23 5:17 PM   10/25/23 5:17 PM     Date of referral: 3/725 Referring provider: Persons, Norma Beckers, [place]  Referring diagnosis? REFERRING DIAG:  Diagnosis  M25.551 (ICD-10-CM) - Pain in right hip   Treatment diagnosis? (if different than referring diagnosis) M25.551, M62.81, R26.2, M54.59  What was this (referring dx) caused by? Ongoing Issue  Lonne Roan of Condition: Initial Onset (within last 3 months)   Laterality: Both  Current Functional Measure Score: Other 5.3: pt specific activity score  Objective measurements identify impairments when they are compared to normal values, the uninvolved extremity, and prior level of function.  [x]  Yes  []  No  Objective assessment of functional ability: Moderate functional limitations  Briefly describe symptoms: Rt and left hip pain, c Rt being worse  How did symptoms start: over time and worsening over the last 3-4 months. Pt c history of bilateral hip replacements.   Average pain intensity:  Last 24 hours: 8/10 mostly Rt hip  Past week: can reach 8/10 at times, 4/10 at the lowest during the last week .   How often does the pt experience symptoms? Constantly  How much have the symptoms interfered with usual daily activities? Quite a bit  How has condition changed since care began at this facility? NA - initial visit  In general, how is the patients overall health? Fair   BACK PAIN (STarT Back Screening Tool) Has pain spread down the leg(s) at some time in the last 2 weeks? yes Has there been pain in the shoulder or neck at some time in the last 2 weeks? Yes, Rt shoulder Has the pt only walked short distances because of back pain? yes Has patient dressed more slowly because of back pain in the past 2 weeks? no Does patient think it's not safe for a person with this condition to be physically active? no Does patient have  worrying thoughts a lot of the time? no Does patient feel back pain is terrible and will never get any better? no Has patient stopped enjoying things they usually enjoy? yes

## 2023-10-27 ENCOUNTER — Encounter: Admitting: Rehabilitative and Restorative Service Providers"

## 2023-11-01 ENCOUNTER — Ambulatory Visit: Admitting: Rehabilitative and Restorative Service Providers"

## 2023-11-01 ENCOUNTER — Encounter: Payer: Self-pay | Admitting: Rehabilitative and Restorative Service Providers"

## 2023-11-01 DIAGNOSIS — M5459 Other low back pain: Secondary | ICD-10-CM | POA: Diagnosis not present

## 2023-11-01 DIAGNOSIS — R262 Difficulty in walking, not elsewhere classified: Secondary | ICD-10-CM

## 2023-11-01 DIAGNOSIS — M6281 Muscle weakness (generalized): Secondary | ICD-10-CM | POA: Diagnosis not present

## 2023-11-01 DIAGNOSIS — M25551 Pain in right hip: Secondary | ICD-10-CM

## 2023-11-01 NOTE — Therapy (Addendum)
 OUTPATIENT PHYSICAL THERAPY LOWER EXTREMITY TREATMENT/PROGRESS NOTE  Progress Note Reporting Period 08/28/2023 to 11/01/2023  See note below for Objective Data and Assessment of Progress/Goals.   PHYSICAL THERAPY DISCHARGE SUMMARY  Visits from Start of Care: 11  Current functional level related to goals / functional outcomes: See note   Remaining deficits: See note   Education / Equipment: HEP   Patient agrees to discharge. Patient goals were partially met. Patient is being discharged due to not returning since the last visit.   Patient Name: Troy Banks MRN: [id number] DOB:07-24-1940, 83 y.o., male Today's Date: 05/01/2024  END OF SESSION:    Past Medical History:  Diagnosis Date   Allergy    Anxiety    PTSD   Aortic stenosis, moderate 03/27/2015   post AVR echo Echo 11/16: Mild LVH, EF 55-60%, normal wall motion, grade 1 diastolic dysfunction, bioprosthetic AVR okay (mean gradient 18 mmHg) with trivial AI, mild LAE, atrial septal aneurysm, small effusion    Arthritis    qwhere (10/29/2015)   Cancer (HCC)    skin cancer   Carotid artery stenosis    Carotid US  11/17: 1-39% bilateral ICA stenosis. F/u prn   Cervical stenosis of spinal canal    fusion 2006   CHF (congestive heart failure) (HCC)    secondry to O[facility]   Closed left pilon fracture    COLONIC POLYPS, HX OF    Complication of anesthesia    takes a lot to put him under and has woken up during surgery Difficult to wake up . PTSD; does not metabolize RX wellthat he gets violent, per pt.    Coronary artery disease    Depression    Difficult intubation 03/30/2015   Glidescop and 4 used 10/28/22, 04/07/23 due to history   DIVERTICULOSIS, COLON    Dyspnea    Dysrhythmia    2 bouts of afib post op and at home but corrected with medication.   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 Cindie   PTSD (post-traumatic stress disorder)    PTSD (post-traumatic stress disorder)    Sleep apnea    On a CPAP   Past Surgical History:  Procedure Laterality Date   ANKLE FUSION Left 03/17/2017   Procedure: FUSION  PILON FRACTURE WITH BONE GRAFTING;  Surgeon: Kendal Franky SQUIBB, MD;  Location: [facility] OR;  Service: Orthopedics;  Laterality: Left;   ANTERIOR FUSION CERVICAL SPINE  2002   AORTIC VALVE REPLACEMENT N/A 03/30/2015   Procedure: AORTIC VALVE REPLACEMENT (AVR);  Surgeon: Dallas KATHEE Jude, MD;  Location: [facility] OR;  Service: Open Heart Surgery;  Laterality: N/A;  ATRIAL FIBRILLATION ABLATION N/A 04/07/2023   Procedure: ATRIAL FIBRILLATION ABLATION;  Surgeon: Cindie Ole DASEN, MD;  Location: [facility] INVASIVE CV LAB;  Service: Cardiovascular;  Laterality: N/A;   CARDIAC CATHETERIZATION N/A 03/26/2015   Procedure: Left Heart Cath and Coronary Angiography;  Surgeon: Alm LELON Clay, MD;  Location: [facility] INVASIVE CV LAB;  Service: Cardiovascular;  Laterality: N/A;   CARDIAC VALVE REPLACEMENT     CHOLECYSTECTOMY N/A 10/28/2022   Procedure: LAPAROSCOPIC CHOLECYSTECTOMY WITH INTRAOPERATIVE CHOLANGIOGRAM;  Surgeon: Gladis Cough, MD;  Location: [facility] ORS;  Service: General;  Laterality: N/A;   COLONOSCOPY W/ POLYPECTOMY     CORONARY ARTERY BYPASS GRAFT N/A 03/30/2015   Procedure: CORONARY ARTERY BYPASS GRAFTING (CABG)  x four, using left internal mammary artery and left    leg greater saphenous vein harvested endoscopically ;  Surgeon: Dallas KATHEE Jude, MD;  Location: [facility] OR;  Service: Open Heart Surgery;  Laterality: N/A;   CORONARY/GRAFT ANGIOGRAPHY N/A 12/03/2021   Procedure: CORONARY/GRAFT ANGIOGRAPHY;  Surgeon: Jordan, [name], MD;  Location: [facility] INVASIVE CV LAB;  Service: Cardiovascular;  Laterality: N/A;   CYSTOSCOPY/URETEROSCOPY/HOLMIUM LASER/STENT PLACEMENT Bilateral 11/07/2016   Procedure: CYSTOSCOPY/URETEROSCOPY/HOLMIUM LASER/STENT PLACEMENT;  Surgeon: Chauncey Redell Agent, MD;  Location: [facility] ORS;  Service: Urology;  Laterality: Bilateral;   CYSTOSCOPY/URETEROSCOPY/HOLMIUM LASER/STENT PLACEMENT Bilateral 11/23/2016   Procedure: CYSTOSCOPY/BILATERAL URETEROSCOPY/HOLMIUM LASER/STENT EXCHANGE;  Surgeon: Chauncey Redell Agent, MD;  Location: [facility] ORS;  Service: Urology;  Laterality: Bilateral;  NEEDS 90 MIN    EXTERNAL FIXATION LEG Left 02/22/2017   Procedure: EXTERNAL FIXATION LEFT ANKLE;  Surgeon: Beverley Evalene BIRCH, MD;  Location: [facility] OR;  Service: Orthopedics;  Laterality: Left;   HEMORRHOID BANDING  1970s   INGUINAL HERNIA REPAIR Left 1962   KIDNEY STONE SURGERY  1986   cut me open   LEFT ATRIAL APPENDAGE OCCLUSION N/A 07/13/2023   Procedure: LEFT ATRIAL APPENDAGE OCCLUSION;  Surgeon: Cindie Ole DASEN, MD;  Location: [facility] INVASIVE CV LAB;  Service: Cardiovascular;  Laterality: N/A;   LEFT HEART CATH AND CORS/GRAFTS ANGIOGRAPHY N/A 10/25/2018   Procedure: LEFT HEART CATH AND CORS/GRAFTS ANGIOGRAPHY;  Surgeon: Clay Alm LELON, MD;  Location: [facility] INVASIVE CV LAB;  Service: Cardiovascular;  Laterality: N/A;   LEFT HEART CATH AND CORS/GRAFTS ANGIOGRAPHY N/A 12/03/2021   Procedure: LEFT HEART CATH AND CORS/GRAFTS ANGIOGRAPHY;  Surgeon: Jordan, [name], MD;  Location: [facility] INVASIVE CV LAB;  Service: Cardiovascular;  Laterality: N/A;   POSTERIOR FUSION CERVICAL SPINE  2006   TEE WITHOUT CARDIOVERSION N/A 03/30/2015    Procedure: TRANSESOPHAGEAL ECHOCARDIOGRAM (TEE);  Surgeon: Dallas KATHEE Jude, MD;  Location: [facility] OR;  Service: Open Heart Surgery;  Laterality: N/A;   TESTICLE TORSION REDUCTION Right 1960   TOE FUSION Left 1985 & 2004   great toe and removal   TONSILLECTOMY     TOTAL HIP ARTHROPLASTY Right 10/27/2015   Procedure: RIGHT TOTAL HIP ARTHROPLASTY ANTERIOR APPROACH and steroid injection right foot;  Surgeon: Lonni CINDERELLA Poli, MD;  Location: [facility] OR;  Service: Orthopedics;  Laterality: Right;   TOTAL HIP ARTHROPLASTY Left 08/13/2019   TOTAL HIP ARTHROPLASTY Left 08/13/2019   Procedure: LEFT TOTAL HIP ARTHROPLASTY ANTERIOR APPROACH;  Surgeon: Poli Lonni CINDERELLA, MD;  Location: [facility] OR;  Service: Orthopedics;  Laterality: Left;   TRANSESOPHAGEAL ECHOCARDIOGRAM (CATH LAB) N/A 07/13/2023   Procedure: TRANSESOPHAGEAL ECHOCARDIOGRAM;  Surgeon: Cindie Ole DASEN, MD;  Location: [facility] INVASIVE CV LAB;  Service: Cardiovascular;  Laterality: N/A;   Patient Active Problem List   Diagnosis Date Noted   IDA (  iron deficiency anemia) 12/28/2023   Anemia 12/26/2023   Hematochezia 12/26/2023   Refractory migraine 08/15/2023   Bursitis of right hip 08/12/2023   Hyperglycemia due to type 2 diabetes mellitus (HCC) 08/09/2023   Atrial fibrillation (HCC) 07/13/2023   Presence of Watchman left atrial appendage closure device 07/13/2023   Encounter for well adult exam with abnormal findings 06/28/2023   Cough 05/20/2023   Smoker 12/27/2022   Acute pain 12/22/2022   Cervical spondylosis without myelopathy 12/22/2022   Cervicogenic headache 12/22/2022   Medication overuse headache 12/22/2022   Hyperlipidemia 12/22/2022   Idiopathic peripheral neuropathy 12/22/2022   Prediabetes 12/22/2022   Cervicalgia 10/19/2022   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 10/14/2022   Right inguinal hernia 10/05/2022   Age-related nuclear cataract, bilateral 09/23/2022   Cysts of right upper eyelid 09/23/2022   Other specified  disorders of eyelid 09/23/2022   Exposure to potentially hazardous substance 09/23/2022   Atrial flutter (HCC) 09/15/2022   Skin lesion 06/28/2022   Pain of left scapula 04/20/2022   Unstable angina (HCC) 12/02/2021   Intractable chronic migraine without aura and without status migrainosus 11/11/2021   Vitamin D  deficiency 06/13/2021   Encounter for fitting and adjustment of hearing aid 11/26/2020   Encounter for fitting and adjustment of spectacles and contact lenses 11/26/2020   DNR (do not resuscitate) discussion 11/26/2020   Aortic atherosclerosis 11/26/2020   Allergic rhinitis 10/05/2020   Sensorineural hearing loss, bilateral 09/29/2020   Hallux rigidus 09/23/2020   Retinoschisis 09/23/2020   RUQ pain 06/10/2020   History of lacunar cerebrovascular accident 04/05/2020   Chronic right-sided headache 03/16/2020   Ankle pain 01/22/2020   Hypothyroidism 11/26/2019   Epididymitis 08/28/2019   Unilateral primary osteoarthritis, left hip 08/13/2019   Status post total replacement of left hip 08/13/2019   Abdominal wall hernia 11/02/2018   Progressive angina (HCC) 10/25/2018   DOE (dyspnea on exertion) 10/25/2018   Abscess of right buttock 11/27/2017   Pilon fracture 03/17/2017   Pilon fracture of left tibia 03/17/2017   At risk for adverse drug reaction 02/28/2017   Closed fracture dislocation of ankle joint, left, with delayed healing, subsequent encounter 02/22/2017   Renal stone 10/26/2016   Gross hematuria 10/26/2016   Bladder neck obstruction 10/26/2016   External hemorrhoid, thrombosed 06/10/2016   Internal hemorrhoids 06/10/2016   Lumbar back pain 05/19/2016   Osteoarthritis of right hip 10/27/2015   Chronic chest pain 09/22/2015   Acute gouty arthritis 06/11/2015   Coronary artery disease involving native coronary artery with angina pectoris 06/11/2015   Chronic diastolic CHF (congestive heart failure) (HCC) 06/11/2015   H/O tissue AVR Oct 2016 06/10/2015    Paroxysmal atrial fibrillation (HCC) 06/10/2015   Dyslipidemia 06/10/2015   S/P CABG x 4-Oct 2016 03/30/2015   H/O NSTEMI-Oct 2016 03/27/2015   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)    Depression 09/08/2014   GERD (gastroesophageal reflux disease)    Essential tremor 03/31/2014   Musculoskeletal pain 01/01/2014   Spondylolisthesis at L4-L5 level 01/01/2014   Sinus bradycardia 12/31/2013   Greater trochanteric bursitis of right hip 12/04/2013   OSA on CPAP    Diet-controlled diabetes mellitus (HCC) 07/08/2009   History of colonic polyps 12/31/2008   GOUT 12/22/2008   Essential hypertension 12/22/2008   Diverticulosis of colon 12/22/2008   NEPHROLITHIASIS, HX OF 12/22/2008    PCP: Norleen Lynwood ORN, MD   REFERRING PROVIDER: Persons, Ronal Dragon, [place]   REFERRING DIAG:  Diagnosis  M25.551 (ICD-10-CM) -  Pain in right hip    THERAPY DIAG:  Right hip pain - Plan: PT plan of care cert/re-cert  Muscle weakness (generalized) - Plan: PT plan of care cert/re-cert  Difficulty in walking, not elsewhere classified - Plan: PT plan of care cert/re-cert  Other low back pain - Plan: PT plan of care cert/re-cert  Rationale for Evaluation and Treatment: Rehabilitation  ONSET DATE: 3-4 months progressively getting worse  SUBJECTIVE:    SUBJECTIVE STATEMENT   Chuck notes progress with his hip pain and self-reported function since starting physical therapy.  His left hip is also having some symptoms, likely due to overuse from compensating for the weaker right side.  Riva would like to continue supervised physical therapy as he feels like although he has improved, there is more work to be done.  PERTINENT HISTORY: THA on left 08/13/2019, THA on Rt 10/27/2015, A-fib, Cardiac valve replacement, ankle fusion 03/17/2017  PAIN:  NPRS scale: 4-6/10 left hip (worse later in the day); 1-6/10 right hip this week Pain location: Lt > Rt gluteal pain this week and  occasionally bilateral groin Pain description: Achy, throbbing, soreness, stiffness Aggravating factors: Standing, transfers, walking, prolonged postures Relieving factors: Resting, changing positions helps some but doesn't completely resolve  PRECAUTIONS: none  WEIGHT BEARING RESTRICTIONS: No  FALLS:  Has patient fallen in last 6 months? No  LIVING ENVIRONMENT: Lives with: lives with their family and lives with their spouse Lives in: House/apartment  Has following equipment at home: Single point cane, Environmental Consultant - 2 wheeled, and shower chair  OCCUPATION: retired  PLOF: Independent  PATIENT GOALS: walk to my grandchild's graduation.   Next MD visit:   OBJECTIVE:   PATIENT SURVEYS:  Patient-Specific Activity Scoring Scheme  0 represents "unable to perform." 10 represents "able to perform at prior level. 0 1 2 3 4 5 6 7 8 9  10 (Date and Score)   Activity Eval  08/28/23 10/09/23   11/01/2023  1. walking  5  4 6   2. dressing  7  8 7   3. Preparing a meal 4 10 10   4.     5.     Score 16/3= 5.3 22/3=7.3 23/30=7.67   Total score = sum of the activity scores/number of activities Minimum detectable change (90%CI) for average score = 2 points Minimum detectable change (90%CI) for single activity score = 3 points  COGNITION: Overall cognitive status: WFL    SENSATION: Number and tingling down Rt LE into    POSTURE:  rounded shoulders, forward head, and decreased lumbar lordosis  PALPATION: TTP: Rt vastus intermedius  LOWER EXTREMITY ROM:   ROM Right Eval Active supine Left Eval Active supine Left/Right 10/19/2023  Hip flexion 95 104 100/100  Hip extension     Hip abduction     Hip adduction     Hip internal rotation   6/8  Hip external rotation   32/43  Knee flexion 125 112   Knee extension -6 -6   Ankle dorsiflexion     Ankle plantarflexion     Ankle inversion     Ankle eversion     Hamstrings   35/35   (Blank rows = not tested)  LOWER EXTREMITY  MMT:  MMT Right eval Left eval Right 09/19/2023 Left 09/19/2023 Left/Right 10/19/2023  Hip flexion 5 5 4+/5 5/5   Hip extension       Hip abduction 3 3   4/4  Hip adduction 3 3     Hip internal rotation 3  3 4/5 4/5   Hip external rotation 3 3 4/5 4/5   Knee flexion       Knee extension       Ankle dorsiflexion       Ankle plantarflexion       Ankle inversion       Ankle eversion        (Blank rows = not tested)    FUNCTIONAL TESTS:  08/28/23: 5 time sit to stand: 27.6 seconds c UE support 09/27/23: 5 time sit to stand:13.3 seconds c UE support  5 time sit to stand:13.6 seconds c no UE support 11/01/2023: 5 time sit to stand took 12 seconds    GAIT: 08/28/23 Distance walked: 30 feet clinic distances level surfaces Assistive device utilized: Single point cane Level of assistance: Modified independence Comments: forward flexed posture, increased bilateral  knee flexion during stance phase, decreased heel to toe strike bilaterally and decreased Dorsiflexion and mobility in left ankle.                                                                                                                                                                        TODAY'S TREATMENT                                                                          DATE: 11/01/2023 Yoga Bridge 2 sets of 10 for 5 seconds Side lie clams with black Thera-Band 2 sets of 10 for 3 seconds  Functional Activities: -Step-down off 4 and 6 inch step on the right 3 total sets of 10 with hands as needed to keep the pelvis level and strain on the gluteals rather than the groin and a 2 inch step on the left to compensate for his ankle fusion and keep strain on the gluteals 3 total sets of 10 -Hip hike in parallel bars 20 x 10 seconds to improve gait quality, minimize lateral lean and improve hip abductors and quadratus lumborum strength Sit to stand a functional test 12 seconds for 5 reps  Single leg modified balance (use  hands so emphasis is on gluteal activation) 10 x 15 seconds   TODAY'S TREATMENT  DATE: 10/25/2023 Hamstrings stretch supine with PT assist 4 x 20 seconds bilateral Yoga bridge 10 x 5 seconds Side-lie clams with Black Thera-Band 10 x 3 seconds   Neuromuscular re-education: Tandem balance eyes open left in front of right and right just beside and 1/2 way in front of left (to address left ankle fusion discomfort) progressing from wider to narrower stance 5 x 20 seconds bilateral  Functional Activities: Step down & hip hike off 4 and 2 inch step (right and left side, respectively) 2 sets of 10 bilateral with focus on posture and slow eccentrics Sit to stand with slow eccentric 5 times   TODAY'S TREATMENT                                                                          DATE: 10/19/2023 Hamstrings stretch supine 4 x 20 seconds bilateral Yoga bridge 10 x 5 seconds  Neuromuscular re-education: Tandem balance eyes open progressing from wider to narrower stance 5 x 20 seconds bilateral  Functional Activities: Step down off 4 and 6 inch step right and 2 inch step left (shorter step due to left ankle fusion) 2 sets of 10 bilateral with focus on posture and slow eccentrics Sit to stand with slow eccentric 5 times Reviewed emphasis of home exercise program on hip abductors strengthening including activities addressed today    PATIENT EDUCATION:  Education details: HEP, POC Person educated: Patient Education method: Explanation, Demonstration, Verbal cues, and Handouts Education comprehension: verbalized understanding, returned demonstration, and verbal cues required  HOME EXERCISE PROGRAM: Access Code: [email or web address] URL: [email or web address] Date: 11/01/2023 Prepared by: Lamar Ivory  Exercises - Supine Lower Trunk Rotation  - 1 x daily - 1 x weekly - 4 reps - 20 seconds hold - Supine March  - 2 x  daily - 1 x weekly - 20 reps - Supine Hip Adduction Isometric with Ball  - 2 x daily - 1 x weekly - 2 sets - 10 reps - 5 seconds hold - Clamshell with Resistance  - 1-2 x daily - 7 x weekly - 2 sets - 10 reps - 3 seconds hold - Yoga Bridge  - 1-2 x daily - 7 x weekly - 2 sets - 10 reps - 5 seconds hold - Tandem Stance  - 1 x daily - 1 x weekly - 1 sets - 5 reps - 20 second hold - Sit to Stand Without Arm Support  - 2 x daily - 7 x weekly - 1 sets - 5 reps - Standing Lumbar Extension at Wall - Forearms  - 5 x daily - 7 x weekly - 1 sets - 5 reps - 3 seconds hold - Standing Scapular Retraction  - 5 x daily - 7 x weekly - 1 sets - 5 reps - 5 second hold - Supine Hamstring Stretch  - 1-2 x daily - 7 x weekly - 1 sets - 5 reps - 20 seconds hold - Lateral Step Down  - 1 x daily - 3 x weekly - 2-3 sets - 10 reps - Single Leg Stance  - 1 x daily - 7 x weekly - 1 sets - 5 reps - 15 seconds hold - Standing Hip Hiking  - 2 x  daily - 7 x weekly - 1 sets - 20 reps - 3 seconds hold  ASSESSMENT:  CLINICAL IMPRESSION: Riva notes functional progress since starting physical therapy.  He is able to do more before being stopped by bilateral hip pain, although his left gluteals.  He is suffering some overuse due to overcompensation for the right.  Given his early progress and a room for further improvement, Riva has requested and would benefit from additional visits as he still uses a cane outside the home and he would like to be more independent.  His previous left ankle fusion also affects his ability to weight-bear and we have been working around this with his rehabilitation along with his history of bilateral hip replacements.  I think with another month of supervised physical therapy, Riva should be independent with an appropriate program to continue long-term.  OBJECTIVE IMPAIRMENTS: Abnormal gait, decreased balance, decreased mobility, difficulty walking, decreased ROM, decreased strength, impaired  flexibility, and pain.   ACTIVITY LIMITATIONS: lifting, bending, sitting, standing, squatting, sleeping, stairs, transfers, and dressing  PARTICIPATION LIMITATIONS: meal prep and community activity  PERSONAL FACTORS: 3+ comorbidities: see PMH above are also affecting patient's functional outcome.   REHAB POTENTIAL: Good  CLINICAL DECISION MAKING: Evolving/moderate complexity  EVALUATION COMPLEXITY: Moderate   GOALS: Goals reviewed with patient? Yes  SHORT TERM GOALS: (target date for Short term goals are 3 weeks 09/18/2023)   1.  Patient will demonstrate independent use of home exercise program to maintain progress from in clinic treatments.  Goal status: Met 10/19/2023 LONG TERM GOALS: (target dates for all long term goals are 10 weeks  12/08/2023 )   1. Patient will demonstrate/report pain at worst less than or equal to 2/10 to facilitate minimal limitation in daily activity secondary to pain symptoms.  Goal status: Ongoing 11/01/2023   2. Patient will demonstrate independent use of home exercise program to facilitate ability to maintain/progress functional gains from skilled physical therapy services.  Goal status: Ongoing 11/01/2023   3. Patient will demonstrate Patient specific functional scale avg > or = 7.3 to indicate reduced disability due to condition.   Goal status: Met 11/01/2023   4.  Patient will demonstrate bil LE MMT >/= 4/5 throughout to faciltiate usual transfers, stairs, squatting at [facility] for daily life.   Goal status: Ongoing 11/01/2023   5.  Patient will demonstrate 5 time sit to stand </= 15 seconds c/s UE support.  Goal status: Met 11/01/2023   6.  Pt will be able to walk >/= 500 feet on community surfaces with LRAD.  Goal status: Ongoing 11/01/2023      PLAN:  PT FREQUENCY: 1-2x/week  PT DURATION: 4-5 weeks  PLANNED INTERVENTIONS: Can include 02853- PT Re-evaluation, 97110-Therapeutic exercises, 97530- Therapeutic activity, 97112- Neuromuscular  re-education, 97535- Self Care, 97140- Manual therapy, [phone]- Gait training, [phone]- Orthotic Fit/training, [phone]- Canalith repositioning, [id number]- Aquatic Therapy, [phone]- Electrical stimulation (unattended), [phone]- Electrical stimulation (manual), [id number] Physical performance testing, 97016- Vasopneumatic device, [id number]- Ultrasound, [id number]- Traction (mechanical), [id number]- Ionotophoresis 4mg /ml Dexamethasone , Patient/Family education, Balance training, Stair training, Taping, Dry Needling, Joint mobilization, Joint manipulation, Spinal manipulation, Spinal mobilization, Scar mobilization, Vestibular training, Visual/preceptual remediation/compensation, DME instructions, Cryotherapy, and Moist heat.  All performed as medically necessary.  All included unless contraindicated  PLAN FOR NEXT SESSION: Low back, hip abductors and general proximal lower extremity strengthening, along with activities to improve weight-bearing endurance.  Keep in mind his left ankle fusion when prescribing activities.  Myer LELON Ivory, PT, MPT 05/01/24 1:57 PM  05/01/24 1:57 PM   05/01/24 1:57 PM     Date of referral: 3/725 Referring provider: Persons, Ronal Dragon, PA  Referring diagnosis? REFERRING DIAG:  Diagnosis  M25.551 (ICD-10-CM) - Pain in right hip   Treatment diagnosis? (if different than referring diagnosis) M25.551, M62.81, R26.2, M54.59  What was this (referring dx) caused by? Ongoing Issue  Lysle of Condition: Initial Onset (within last 3 months)   Laterality: Both  Current Functional Measure Score: Other 5.3: pt specific activity score  Objective measurements identify impairments when they are compared to normal values, the uninvolved extremity, and prior level of function.  [x]  Yes  []  No  Objective assessment of functional ability: Moderate functional limitations   Briefly describe symptoms: Rt and left hip pain  How did symptoms start: over time and worsening over the last 3-4 months. Pt c  history of bilateral hip replacements and a Lt ankle fusion  Average pain intensity:  Last 24 hours: 6/10 (was 8/10)  Past week: can reach 6/10 at times, 1/10 at the lowest during the last week.   How often does the pt experience symptoms? Constantly  How much have the symptoms interfered with usual daily activities? Quite a bit  How has condition changed since care began at this facility? Improved, see above   NA - initial visit  In general, how is the patients overall health? Fair   BACK PAIN (STarT Back Screening Tool) Has pain spread down the leg(s) at some time in the last 2 weeks? yes Has there been pain in the shoulder or neck at some time in the last 2 weeks? Yes, Rt shoulder Has the pt only walked short distances because of back pain? yes Has patient dressed more slowly because of back pain in the past 2 weeks? no Does patient think it's not safe for a person with this condition to be physically active? no Does patient have worrying thoughts a lot of the time? no Does patient feel back pain is terrible and will never get any better? no Has patient stopped enjoying things they usually enjoy? yes

## 2023-11-08 ENCOUNTER — Encounter: Payer: Self-pay | Admitting: Physical Therapy

## 2023-11-15 DIAGNOSIS — G4486 Cervicogenic headache: Secondary | ICD-10-CM | POA: Diagnosis not present

## 2023-11-15 DIAGNOSIS — M47812 Spondylosis without myelopathy or radiculopathy, cervical region: Secondary | ICD-10-CM | POA: Diagnosis not present

## 2023-11-15 DIAGNOSIS — Z9181 History of falling: Secondary | ICD-10-CM | POA: Diagnosis not present

## 2023-11-21 DIAGNOSIS — G4486 Cervicogenic headache: Secondary | ICD-10-CM | POA: Diagnosis not present

## 2023-11-21 DIAGNOSIS — M47812 Spondylosis without myelopathy or radiculopathy, cervical region: Secondary | ICD-10-CM | POA: Diagnosis not present

## 2023-12-05 DIAGNOSIS — R52 Pain, unspecified: Secondary | ICD-10-CM | POA: Diagnosis not present

## 2023-12-05 DIAGNOSIS — G43019 Migraine without aura, intractable, without status migrainosus: Secondary | ICD-10-CM | POA: Diagnosis not present

## 2023-12-05 DIAGNOSIS — M542 Cervicalgia: Secondary | ICD-10-CM | POA: Diagnosis not present

## 2023-12-05 DIAGNOSIS — G43719 Chronic migraine without aura, intractable, without status migrainosus: Secondary | ICD-10-CM | POA: Diagnosis not present

## 2023-12-05 DIAGNOSIS — G4733 Obstructive sleep apnea (adult) (pediatric): Secondary | ICD-10-CM | POA: Diagnosis not present

## 2023-12-05 DIAGNOSIS — G4486 Cervicogenic headache: Secondary | ICD-10-CM | POA: Diagnosis not present

## 2023-12-05 DIAGNOSIS — G4440 Drug-induced headache, not elsewhere classified, not intractable: Secondary | ICD-10-CM | POA: Diagnosis not present

## 2023-12-05 DIAGNOSIS — M47812 Spondylosis without myelopathy or radiculopathy, cervical region: Secondary | ICD-10-CM | POA: Diagnosis not present

## 2023-12-12 DIAGNOSIS — G4486 Cervicogenic headache: Secondary | ICD-10-CM | POA: Diagnosis not present

## 2023-12-12 DIAGNOSIS — M47812 Spondylosis without myelopathy or radiculopathy, cervical region: Secondary | ICD-10-CM | POA: Diagnosis not present

## 2023-12-14 ENCOUNTER — Ambulatory Visit: Admitting: Podiatry

## 2023-12-23 ENCOUNTER — Other Ambulatory Visit: Payer: Self-pay | Admitting: Pulmonary Disease

## 2023-12-23 ENCOUNTER — Other Ambulatory Visit: Payer: Self-pay | Admitting: Cardiology

## 2023-12-25 ENCOUNTER — Encounter: Payer: Self-pay | Admitting: Cardiology

## 2023-12-26 ENCOUNTER — Ambulatory Visit: Payer: Medicare Other | Admitting: Internal Medicine

## 2023-12-26 ENCOUNTER — Encounter: Payer: Self-pay | Admitting: Internal Medicine

## 2023-12-26 VITALS — BP 132/54 | HR 54 | Temp 98.4°F | Ht 68.0 in | Wt 188.6 lb

## 2023-12-26 DIAGNOSIS — I10 Essential (primary) hypertension: Secondary | ICD-10-CM

## 2023-12-26 DIAGNOSIS — E559 Vitamin D deficiency, unspecified: Secondary | ICD-10-CM | POA: Diagnosis not present

## 2023-12-26 DIAGNOSIS — G4486 Cervicogenic headache: Secondary | ICD-10-CM | POA: Diagnosis not present

## 2023-12-26 DIAGNOSIS — Z7985 Long-term (current) use of injectable non-insulin antidiabetic drugs: Secondary | ICD-10-CM | POA: Diagnosis not present

## 2023-12-26 DIAGNOSIS — M47812 Spondylosis without myelopathy or radiculopathy, cervical region: Secondary | ICD-10-CM | POA: Diagnosis not present

## 2023-12-26 DIAGNOSIS — E785 Hyperlipidemia, unspecified: Secondary | ICD-10-CM

## 2023-12-26 DIAGNOSIS — K921 Melena: Secondary | ICD-10-CM

## 2023-12-26 DIAGNOSIS — E119 Type 2 diabetes mellitus without complications: Secondary | ICD-10-CM | POA: Diagnosis not present

## 2023-12-26 DIAGNOSIS — D649 Anemia, unspecified: Secondary | ICD-10-CM | POA: Diagnosis not present

## 2023-12-26 NOTE — Assessment & Plan Note (Signed)
 Last vitamin D Lab Results  Component Value Date   VD25OH 25.19 (L) 06/28/2023   Low, to start oral replacement

## 2023-12-26 NOTE — Assessment & Plan Note (Signed)
 Lab Results  Component Value Date   HGBA1C 7.6 (H) 06/28/2023   Mild uncontrolled, , pt to continue current medical tx ozempic 2 mg weekly, declines other change

## 2023-12-26 NOTE — Assessment & Plan Note (Addendum)
 Lab Results  Component Value Date   WBC 7.8 06/27/2023   HGB 14.1 06/27/2023   HCT 42.4 06/27/2023   MCV 85 06/27/2023   PLT 210 06/27/2023  With recebt hgb 10.0, pt for f/u lab today and iron lab, continue plavix  for now, consider start slow iron, also refer Dr Legrand

## 2023-12-26 NOTE — Assessment & Plan Note (Signed)
 Lab Results  Component Value Date   LDLCALC 43 06/28/2023   Stable, pt to continue current statin crestor  10 mg qd

## 2023-12-26 NOTE — Assessment & Plan Note (Signed)
 BP Readings from Last 3 Encounters:  12/26/23 (!) 132/54  10/19/23 (!) 148/74  10/09/23 136/72   Stable, pt to continue medical treatment card cd 240 mg qd

## 2023-12-26 NOTE — Patient Instructions (Signed)
 Please continue all other medications as before, including the plavix  for now  Please have the pharmacy call with any other refills you may need.  Please continue your efforts at being more active, low cholesterol diet, and weight control  Please keep your appointments with your specialists as you may have planned  You will be contacted regarding the referral for: Dr Legrand - GI  Please go to the LAB at the blood drawing area for the tests to be done  You will be contacted by phone if any changes need to be made immediately.  Otherwise, you will receive a letter about your results with an explanation, but please check with MyChart first.  Please make an Appointment to return in 6 months, or sooner if needed

## 2023-12-26 NOTE — Assessment & Plan Note (Signed)
 Small volume for lab f/u today and refer GI

## 2023-12-26 NOTE — Progress Notes (Signed)
 Patient ID: Troy Banks, male   DOB: 07-Apr-1941, 83 y.o.   MRN: [id number]        Chief Complaint: follow up anemia, hematochezia, hyperglycemia       HPI:  Troy Banks is a 83 y.o. male here with wife, with recent episodes small volume hematochezia but Denies worsening reflux, abd pain, dysphagia, n/v, bowel change.  Pt denies chest pain, increased sob or doe, wheezing, orthopnea, PND, increased LE swelling, palpitations, dizziness or syncope.   Pt denies polydipsia, polyuria, or new focal neuro s/s.   Remains on plavix  - pt reports likely ok to stop the plavix  aug 7 which is 8 mo after Watchman placement.   Pt to be seen later today 230pm for last injection for permanent nerve block for right headache.  Wife reports Hgb 10.0 with normal MCV at outside provider.   Wt Readings from Last 3 Encounters:  12/26/23 188 lb 9.6 oz (85.5 kg)  10/09/23 187 lb 9.6 oz (85.1 kg)  10/06/23 195 lb (88.5 kg)   BP Readings from Last 3 Encounters:  12/26/23 (!) 132/54  10/19/23 (!) 148/74  10/09/23 136/72         Past Medical History:  Diagnosis Date   Allergy    Anxiety    PTSD   Aortic stenosis, moderate 03/27/2015   post AVR echo Echo 11/16: Mild LVH, EF 55-60%, normal wall motion, grade 1 diastolic dysfunction, bioprosthetic AVR okay (mean gradient 18 mmHg) with trivial AI, mild LAE, atrial septal aneurysm, small effusion    Arthritis    qwhere (10/29/2015)   Cancer (HCC)    skin cancer   Carotid artery stenosis    Carotid US  11/17: 1-39% bilateral ICA stenosis. F/u prn   Cervical stenosis of spinal canal    fusion 2006   CHF (congestive heart failure) (HCC)    secondry to OHS   Closed left pilon fracture    COLONIC POLYPS, HX OF    Complication of anesthesia    takes a lot to put him under and has woken up during surgery Difficult to wake up . PTSD; does not metabolize RX wellthat he gets violent, per pt.    Coronary artery disease    Depression    Difficult  intubation 03/30/2015   Glidescop and 4 used 10/28/22, 04/07/23 due to history   DIVERTICULOSIS, COLON    Dyspnea    Dysrhythmia    2 bouts of afib post op and at home but corrected with medication.   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 Cindie   PTSD (post-traumatic stress disorder)  PTSD (post-traumatic stress disorder)    Sleep apnea    On a CPAP   Past Surgical History:  Procedure Laterality Date   ANKLE FUSION Left 03/17/2017   Procedure: FUSION  PILON FRACTURE WITH BONE GRAFTING;  Surgeon: Kendal Franky SQUIBB, MD;  Location: [facility] OR;  Service: Orthopedics;  Laterality: Left;   ANTERIOR FUSION CERVICAL SPINE  2002   AORTIC VALVE REPLACEMENT N/A 03/30/2015   Procedure: AORTIC VALVE REPLACEMENT (AVR);  Surgeon: Dallas KATHEE Jude, MD;  Location: [facility] OR;  Service: Open Heart Surgery;  Laterality: N/A;   ATRIAL FIBRILLATION ABLATION N/A 04/07/2023   Procedure: ATRIAL FIBRILLATION ABLATION;  Surgeon: Cindie Ole DASEN, MD;  Location: [facility] INVASIVE CV LAB;  Service: Cardiovascular;  Laterality: N/A;   CARDIAC CATHETERIZATION N/A 03/26/2015   Procedure: Left Heart Cath and Coronary Angiography;  Surgeon: Alm LELON Clay, MD;  Location: [facility] INVASIVE CV LAB;  Service: Cardiovascular;  Laterality:  N/A;   CARDIAC VALVE REPLACEMENT     CHOLECYSTECTOMY N/A 10/28/2022   Procedure: LAPAROSCOPIC CHOLECYSTECTOMY WITH INTRAOPERATIVE CHOLANGIOGRAM;  Surgeon: Gladis Cough, MD;  Location: [facility] ORS;  Service: General;  Laterality: N/A;   COLONOSCOPY W/ POLYPECTOMY     CORONARY ARTERY BYPASS GRAFT N/A 03/30/2015   Procedure: CORONARY ARTERY BYPASS GRAFTING (CABG) x four, using left internal mammary artery and left    leg greater saphenous vein harvested endoscopically ;  Surgeon: Dallas KATHEE Jude, MD;  Location: [facility] OR;  Service: Open Heart Surgery;  Laterality: N/A;   CORONARY/GRAFT ANGIOGRAPHY N/A 12/03/2021   Procedure: CORONARY/GRAFT ANGIOGRAPHY;  Surgeon: Swaziland, [name], MD;  Location: [facility] INVASIVE CV LAB;  Service: Cardiovascular;  Laterality: N/A;   CYSTOSCOPY/URETEROSCOPY/HOLMIUM LASER/STENT PLACEMENT Bilateral 11/07/2016   Procedure: CYSTOSCOPY/URETEROSCOPY/HOLMIUM LASER/STENT PLACEMENT;  Surgeon: Chauncey Redell Agent, MD;  Location: [facility] ORS;  Service: Urology;  Laterality: Bilateral;   CYSTOSCOPY/URETEROSCOPY/HOLMIUM LASER/STENT PLACEMENT Bilateral 11/23/2016   Procedure: CYSTOSCOPY/BILATERAL URETEROSCOPY/HOLMIUM LASER/STENT EXCHANGE;  Surgeon: Chauncey Redell Agent, MD;  Location: [facility] ORS;  Service: Urology;  Laterality: Bilateral;  NEEDS 90 MIN    EXTERNAL FIXATION LEG Left 02/22/2017   Procedure: EXTERNAL FIXATION LEFT ANKLE;  Surgeon: Beverley Evalene BIRCH, MD;  Location: [facility] OR;  Service: Orthopedics;  Laterality: Left;   HEMORRHOID BANDING  1970s   INGUINAL HERNIA REPAIR Left 1962   KIDNEY STONE SURGERY  1986   cut me open   LEFT ATRIAL APPENDAGE OCCLUSION N/A 07/13/2023   Procedure: LEFT ATRIAL APPENDAGE OCCLUSION;  Surgeon: Cindie Ole DASEN, MD;  [facility]: [facility] INVASIVE CV LAB;  Service: Cardiovascular;  Laterality: N/A;   LEFT HEART CATH AND CORS/GRAFTS ANGIOGRAPHY N/A 10/25/2018   Procedure: LEFT HEART CATH AND CORS/GRAFTS ANGIOGRAPHY;  Surgeon: Clay Alm LELON, MD;  Location: [facility] INVASIVE CV  LAB;  Service: Cardiovascular;  Laterality: N/A;   LEFT HEART CATH AND CORS/GRAFTS ANGIOGRAPHY N/A 12/03/2021   Procedure: LEFT HEART CATH AND CORS/GRAFTS ANGIOGRAPHY;  Surgeon: Swaziland, [name], MD;  Location: [facility] INVASIVE CV LAB;  Service: Cardiovascular;  Laterality: N/A;   POSTERIOR FUSION CERVICAL SPINE  2006   TEE WITHOUT CARDIOVERSION N/A 03/30/2015   Procedure: TRANSESOPHAGEAL ECHOCARDIOGRAM (TEE);  Surgeon: Dallas KATHEE Jude, MD;  Location: [facility] OR;  Service: Open Heart Surgery;  Laterality: N/A;   TESTICLE TORSION REDUCTION Right 1960   TOE FUSION Left 1985 & 2004   great toe and removal   TONSILLECTOMY     TOTAL HIP ARTHROPLASTY Right 10/27/2015   Procedure: RIGHT TOTAL HIP ARTHROPLASTY ANTERIOR APPROACH and steroid injection right foot;  Surgeon:  Lonni CINDERELLA Poli, MD;  Location: [facility] OR;  Service: Orthopedics;  Laterality: Right;   TOTAL HIP ARTHROPLASTY Left 08/13/2019   TOTAL HIP ARTHROPLASTY Left 08/13/2019   Procedure: LEFT TOTAL HIP ARTHROPLASTY ANTERIOR APPROACH;  Surgeon: Poli Lonni CINDERELLA, MD;  Location: [facility] OR;  Service: Orthopedics;  Laterality: Left;   TRANSESOPHAGEAL ECHOCARDIOGRAM (CATH LAB) N/A 07/13/2023   Procedure: TRANSESOPHAGEAL ECHOCARDIOGRAM;  Surgeon: Cindie Ole DASEN, MD;  Location: [facility] INVASIVE CV LAB;  Service: Cardiovascular;  Laterality: N/A;    reports that he has been smoking cigarettes. He started smoking about 67 years ago. He has a 34 pack-year smoking history. He has never used smokeless tobacco. He reports current alcohol use of about 3.0 standard drinks of alcohol per week. He reports that he does not use drugs. family history includes Alcohol abuse in an other family member; Arthritis in an other family member; Diabetes in his mother and paternal grandfather; Drug abuse in his sister; Healthy in his brother, daughter, and son; Heart disease in his father, maternal grandfather, maternal grandmother, mother, and paternal grandfather; Hypertension in  his father, maternal grandfather, maternal grandmother, mother, and paternal grandfather; Prostate cancer in his maternal uncle; Tremor in his sister. Allergies  Allergen Reactions   Acyclovir And Related Other (See Comments)    Accumulates and causes stroke-like side-effects due to cytochrome P450 enzyme deficiency   Benzodiazepines Other (See Comments)    Patient states he gets stroke symptoms with benzo's.    Compazine [Prochlorperazine Maleate] Other (See Comments)    Accumulates and causes stroke-like side-effects due to cytochrome P450 enzyme deficiency   Coreg  [Carvedilol ] Other (See Comments)    Accumulates and causes stroke-like side-effects due to cytochrome P450 enzyme deficiency Patient poor metabolizer of CYP2D6 - Coreg  undergoes extensive hepatic (including 2D6)   Flomax  [Tamsulosin  Hcl] Other (See Comments)    Accumulates and causes stroke-like side-effects due to cytochrome P450 enzyme deficiency   Lisinopril Other (See Comments)    Hyperkalemia. 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   Mobic  [Meloxicam ] Other (See Comments)    Accumulates and causes stroke-like side-effects due to cytochrome P450 enzyme deficiency   Morphine  And Codeine Other (See Comments)    Per pt's wife morphine  also contraindicated due to p450 enzyme deficiency.     Ondansetron  Other (See Comments)    Accumulates and causes stroke-like side-effects due to cytochrome P450 enzyme deficiency    Oxycodone  Other (See Comments)    Accumulates and causes stroke-like side-effects due to cytochrome P450 enzyme deficiency   Promethazine  Other (See Comments)    Accumulates and causes stroke-like side-effects due to cytochrome P450 enzyme deficiency   Sertraline Hcl Other (See Comments)    Accumulates and causes stroke-like side-effects due to cytochrome P450 enzyme deficiency   Tramadol Other (See Comments)    Accumulates and causes stroke-like side-effects  due to cytochrome P450 enzyme deficiency   Atorvastatin  Other (See Comments)    MYALGIAS   Colchicine  Other (See Comments)    ataxia   Fentanyl  Other (See Comments)    Accumulates and cause stroke like symptoms   Hydrocodone  Other (See Comments)    Accumulates and causes stroke-like side-effects due to cytochrome P450 enzyme deficiency   Current Outpatient Medications on File Prior to Visit  Medication Sig Dispense Refill   allopurinol  (ZYLOPRIM ) 300 MG tablet TAKE 1 TABLET BY MOUTH ONCE DAILY 90 tablet 3   amLODipine  (NORVASC ) 10 MG tablet TAKE 1 TABLET BY MOUTH EVERY DAY 90 tablet  3   amoxicillin  (AMOXIL ) 500 MG tablet Take 4 tablets by mouth one hour prior to dental procedure 4 tablet 1   Ascorbic Acid  (VITAMIN C ) 1000 MG tablet Take 1,000 mg by mouth daily.     Blood Glucose Monitoring Suppl (ONETOUCH VERIO FLEX SYSTEM) w/Device KIT      Carboxymethylcellulose Sodium (LUBRICANT EYE DROPS OP) Place 1 drop into both eyes 4 (four) times daily as needed (dry/irritated eyes.). Ocean fresh     Cholecalciferol  (VITAMIN D -3 PO) Take 2,000 Units by mouth every other day.     clopidogrel  (PLAVIX ) 75 MG tablet Take 1 tablet (75 mg total) by mouth daily. 30 tablet 6   Cyanocobalamin  (VITAMIN B-12) 5000 MCG TBDP Take 5,000 mcg by mouth every other day.     diclofenac  Sodium (VOLTAREN ) 1 % GEL Apply 2 g topically.     diltiazem  (CARDIZEM  CD) 240 MG 24 hr capsule TAKE 1 CAPSULE BY MOUTH DAILY 90 capsule 3   famotidine  (PEPCID ) 40 MG tablet TAKE 1 TABLET BY MOUTH ONCE DAILY 90 tablet 3   furosemide  (LASIX ) 40 MG tablet TAKE 1 TABLET BY MOUTH EVERY OTHER DAY 45 tablet 2   isosorbide  mononitrate (IMDUR ) 60 MG 24 hr tablet Take 1 tablet (60 mg total) by mouth daily. 90 tablet 3   Lancets (ONETOUCH DELICA PLUS LANCET30G) MISC check blood glucose DAILY     levothyroxine  (SYNTHROID ) 25 MCG tablet TAKE 1 TABLET BY MOUTH EVERY MORNING WITH BREAKFAST 90 tablet 3   nitroGLYCERIN  (NITROSTAT ) 0.4 MG SL tablet  PLACE 1 TABLET UNDER THE TONGUE EVERY 5 MINUTES AS NEEDED FOR CHEST PAIN. 25 tablet 3   NON FORMULARY Pt uses a cpap nightly     ONETOUCH VERIO test strip      rosuvastatin  (CRESTOR ) 10 MG tablet TAKE 1/2 TABLET BY MOUTH EVERY DAY 45 tablet 3   Semaglutide,0.25 or 0.5MG /DOS, (OZEMPIC, 0.25 OR 0.5 MG/DOSE,) 2 MG/3ML SOPN Inject 0.5 mLs as directed once a week.     terbinafine (LAMISIL) 250 MG tablet Take 250 mg by mouth once a week. Patient taking every other week     tiZANidine  (ZANAFLEX ) 4 MG tablet Take 4 mg by mouth daily as needed for muscle spasms.     Turmeric 500 MG CAPS Take 500 mg by mouth daily.      [DISCONTINUED] ranitidine  (ZANTAC ) 150 MG tablet TAKE 1 TABLET BY MOUTH 2 TIMES DAILY (Patient not taking: Reported on 08/30/2023) 180 tablet 0   No current facility-administered medications on file prior to visit.        ROS:  All others reviewed and negative.  Objective        PE:  BP (!) 132/54   Pulse (!) 54   Temp 98.4 F (36.9 C)   Ht 5' 8 (1.727 m)   Wt 188 lb 9.6 oz (85.5 kg)   SpO2 99%   BMI 28.68 kg/m                 Constitutional: Pt appears in NAD               HENT: Head: NCAT.                Right Ear: External ear normal.                 Left Ear: External ear normal.                Eyes: . Pupils are equal, round,  and reactive to light. Conjunctivae and EOM are normal               Nose: without d/c or deformity               Neck: Neck supple. Gross normal ROM               Cardiovascular: Normal rate and regular rhythm.                 Pulmonary/Chest: Effort normal and breath sounds without rales or wheezing.                Abd:  Soft, NT, ND, + BS, no organomegaly               Neurological: Pt is alert. At baseline orientation, motor grossly intact               Skin: Skin is warm. No rashes, no other new lesions, LE edema - none               Psychiatric: Pt behavior is normal without agitation   Micro: none  Cardiac tracings I have personally  interpreted today:  none  Pertinent Radiological findings (summarize): none   Lab Results  Component Value Date   WBC 7.8 06/27/2023   HGB 14.1 06/27/2023   HCT 42.4 06/27/2023   PLT 210 06/27/2023   GLUCOSE 133 (H) 10/09/2023   CHOL 115 06/28/2023   TRIG 131.0 06/28/2023   HDL 46.10 06/28/2023   LDLDIRECT 77.0 06/28/2022   LDLCALC 43 06/28/2023   ALT 28 06/28/2023   AST 28 06/28/2023   NA 144 10/09/2023   K 4.9 10/09/2023   CL 106 10/09/2023   CREATININE 0.95 10/09/2023   BUN 11 10/09/2023   CO2 22 10/09/2023   TSH 4.45 06/28/2023   PSA 1.95 06/28/2022   INR 1.1 09/15/2022   HGBA1C 7.6 (H) 06/28/2023   MICROALBUR 0.3 01/02/2009   Assessment/Plan:  Troy Banks is a 83 y.o. White or Caucasian [1] male with  has a past medical history of Allergy, Anxiety, Aortic stenosis, moderate (03/27/2015), Arthritis, Cancer (HCC), Carotid artery stenosis, Cervical stenosis of spinal canal, CHF (congestive heart failure) (HCC), Closed left pilon fracture, COLONIC POLYPS, HX OF, Complication of anesthesia, Coronary artery disease, Depression, Difficult intubation (03/30/2015), DIVERTICULOSIS, COLON, Dyspnea, Dysrhythmia, Familial tremor (03/19/2014), Family history of adverse reaction to anesthesia, GERD (gastroesophageal reflux disease), GOUT, Headache, Heart murmur, High cholesterol, History of blood transfusion, History of kidney stones, HYPERTENSION, Hypothyroidism, Myocardial infarction (HCC), OSA on CPAP, Pneumonia, 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  Anemia Lab Results  Component Value Date   WBC 7.8 06/27/2023   HGB 14.1 06/27/2023   HCT 42.4 06/27/2023   MCV 85 06/27/2023   PLT 210 06/27/2023  With recebt hgb 10.0, pt for f/u lab today and iron lab, continue plavix  for now,  consider start slow iron, also refer Dr Legrand   Diet-controlled diabetes mellitus [facility]) Lab Results  Component Value Date   HGBA1C 7.6 (H) 06/28/2023   Mild uncontrolled, , pt to continue current medical tx ozempic 2 mg weekly, declines other change   Dyslipidemia Lab Results  Component Value Date   LDLCALC 43 06/28/2023   Stable, pt to continue current statin crestor  10 mg qd   Essential hypertension BP Readings from  Last 3 Encounters:  12/26/23 (!) 132/54  10/19/23 (!) 148/74  10/09/23 136/72   Stable, pt to continue medical treatment card cd 240 mg qd   Hematochezia Small volume for lab f/u today and refer GI  Vitamin D  deficiency Last vitamin D  Lab Results  Component Value Date   VD25OH 25.19 (L) 06/28/2023   Low, to start oral replacement  Followup: Return in about 6 months (around 06/27/2024).  Lynwood Rush, MD 12/26/2023 8:55 PM [facility] - [facility] Internal Medicine

## 2023-12-27 ENCOUNTER — Other Ambulatory Visit: Payer: Self-pay | Admitting: Internal Medicine

## 2023-12-27 ENCOUNTER — Other Ambulatory Visit (INDEPENDENT_AMBULATORY_CARE_PROVIDER_SITE_OTHER)

## 2023-12-27 ENCOUNTER — Ambulatory Visit: Payer: Self-pay | Admitting: Internal Medicine

## 2023-12-27 DIAGNOSIS — K921 Melena: Secondary | ICD-10-CM | POA: Diagnosis not present

## 2023-12-27 DIAGNOSIS — D649 Anemia, unspecified: Secondary | ICD-10-CM | POA: Diagnosis not present

## 2023-12-27 LAB — CBC WITH DIFFERENTIAL/PLATELET
Basophils Absolute: 0 K/uL (ref 0.0–0.1)
Basophils Relative: 0.5 % (ref 0.0–3.0)
Eosinophils Absolute: 0 K/uL (ref 0.0–0.7)
Eosinophils Relative: 0 % (ref 0.0–5.0)
HCT: 30.9 % — ABNORMAL LOW (ref 39.0–52.0)
Hemoglobin: 10.3 g/dL — ABNORMAL LOW (ref 13.0–17.0)
Lymphocytes Relative: 11.4 % — ABNORMAL LOW (ref 12.0–46.0)
Lymphs Abs: 1.2 K/uL (ref 0.7–4.0)
MCHC: 33.3 g/dL (ref 30.0–36.0)
MCV: 93 fl (ref 78.0–100.0)
Monocytes Absolute: 1 K/uL (ref 0.1–1.0)
Monocytes Relative: 9.5 % (ref 3.0–12.0)
Neutro Abs: 8 K/uL — ABNORMAL HIGH (ref 1.4–7.7)
Neutrophils Relative %: 78.6 % — ABNORMAL HIGH (ref 43.0–77.0)
Platelets: 236 K/uL (ref 150.0–400.0)
RBC: 3.32 Mil/uL — ABNORMAL LOW (ref 4.22–5.81)
RDW: 14.9 % (ref 11.5–15.5)
WBC: 10.2 K/uL (ref 4.0–10.5)

## 2023-12-27 LAB — IBC PANEL
Iron: 27 ug/dL — ABNORMAL LOW (ref 42–165)
Saturation Ratios: 7.1 % — ABNORMAL LOW (ref 20.0–50.0)
TIBC: 378 ug/dL (ref 250.0–450.0)
Transferrin: 270 mg/dL (ref 212.0–360.0)

## 2023-12-27 LAB — FERRITIN: Ferritin: 13 ng/mL — ABNORMAL LOW (ref 22.0–322.0)

## 2023-12-27 MED ORDER — FERROUS SULFATE ER 50 MG PO TBCR: EXTENDED_RELEASE_TABLET | ORAL | 11 refills | Status: AC

## 2023-12-28 ENCOUNTER — Encounter: Payer: Self-pay | Admitting: Internal Medicine

## 2023-12-28 ENCOUNTER — Telehealth: Payer: Self-pay

## 2023-12-28 ENCOUNTER — Ambulatory Visit: Admitting: Podiatry

## 2023-12-28 DIAGNOSIS — D509 Iron deficiency anemia, unspecified: Secondary | ICD-10-CM | POA: Insufficient documentation

## 2023-12-28 NOTE — Telephone Encounter (Signed)
 Dr. Norleen, patient will be scheduled as soon as possible.  Auth Submission: NO AUTH NEEDED Site of care: Site of care: [facility] Payer: [employer] medicare Medication & CPT/J Code(s) submitted: Feraheme (ferumoxytol) [id number] Diagnosis Code:  Route of submission (phone, fax, portal):  Phone # Fax # Auth type: Buy/Bill PB Units/visits requested: 510mg  x 2 doses Reference number:  Approval from: 12/28/23 to 04/29/24

## 2024-01-03 ENCOUNTER — Ambulatory Visit (INDEPENDENT_AMBULATORY_CARE_PROVIDER_SITE_OTHER)

## 2024-01-03 VITALS — BP 164/76 | HR 57 | Temp 98.4°F | Resp 16 | Ht 68.0 in | Wt 182.8 lb

## 2024-01-03 DIAGNOSIS — R519 Headache, unspecified: Secondary | ICD-10-CM

## 2024-01-03 DIAGNOSIS — D500 Iron deficiency anemia secondary to blood loss (chronic): Secondary | ICD-10-CM

## 2024-01-03 DIAGNOSIS — D509 Iron deficiency anemia, unspecified: Secondary | ICD-10-CM | POA: Diagnosis not present

## 2024-01-03 DIAGNOSIS — G43719 Chronic migraine without aura, intractable, without status migrainosus: Secondary | ICD-10-CM

## 2024-01-03 MED ORDER — SODIUM CHLORIDE 0.9 % IV SOLN
510.0000 mg | Freq: Once | INTRAVENOUS | Status: AC
  Administered 2024-01-03: 510 mg via INTRAVENOUS
  Filled 2024-01-03: qty 17

## 2024-01-03 NOTE — Patient Instructions (Signed)

## 2024-01-03 NOTE — Progress Notes (Signed)
 Diagnosis: Iron Deficiency Anemia  Provider:  Praveen Mannam MD  Procedure: IV Infusion  IV Type: Peripheral, IV Location: L Forearm  Feraheme (Ferumoxytol ), Dose: 510 mg  Infusion Start Time: 1531  Infusion Stop Time: 1550  Post Infusion IV Care: Observation period completed and Peripheral IV Discontinued  Discharge: Condition: Good, Destination: Home . AVS Declined  Performed by:  Chandlar Guice, RN

## 2024-01-04 ENCOUNTER — Ambulatory Visit: Admitting: Podiatry

## 2024-01-04 ENCOUNTER — Encounter: Payer: Self-pay | Admitting: Podiatry

## 2024-01-04 DIAGNOSIS — M79675 Pain in left toe(s): Secondary | ICD-10-CM | POA: Diagnosis not present

## 2024-01-04 DIAGNOSIS — B351 Tinea unguium: Secondary | ICD-10-CM | POA: Diagnosis not present

## 2024-01-04 DIAGNOSIS — M79674 Pain in right toe(s): Secondary | ICD-10-CM

## 2024-01-04 DIAGNOSIS — E119 Type 2 diabetes mellitus without complications: Secondary | ICD-10-CM

## 2024-01-04 NOTE — Progress Notes (Signed)
This patient returns to the office for evaluation and treatment of long thick painful nails .  This patient is unable to trim his own nails since the patient cannot reach the feet.  Patient says the nails are painful walking and wearing his shoes.  He returns for preventive foot care services.    General Appearance  Alert, conversant and in no acute stress.  Vascular  Dorsalis pedis and posterior tibial  pulses are palpable  bilaterally.  Capillary return is within normal limits  bilaterally. Temperature is within normal limits  bilaterally.  Neurologic  Senn-Weinstein monofilament wire test within normal limits  bilaterally. Muscle power within normal limits bilaterally.  Nails Thick disfigured discolored nails with subungual debris  from hallux to fifth toes bilaterally. No evidence of bacterial infection or drainage bilaterally.  Orthopedic  No limitations of motion  feet .  No crepitus or effusions noted.  No bony pathology or digital deformities noted.  Ankle/STJ immobile left foot/ankle.  Fused 1st MPJ right foot.  Skin  normotropic skin with no porokeratosis noted bilaterally.  No signs of infections or ulcers noted.     Onychomycosis  Pain in toes right foot  Pain in toes left foot  Debridement  of nails  1-5  B/L with a nail nipper.  Nails were then filed using a dremel tool with no incidents.    RTC  3 month   [name] DPM 

## 2024-01-08 NOTE — Progress Notes (Unsigned)
 Electrophysiology Office Note:   Date:  01/11/2024  ID:  Troy Banks, DOB 01/15/1941, MRN [id number]  Primary Cardiologist: Peter Swaziland, MD Primary Heart Failure: None Electrophysiologist: OLE ONEIDA HOLTS, MD      History of Present Illness:   Troy Banks is a 83 y.o. 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   He is s/p AF / AFL ablation 04/08/23.  Given his CYP Enzyme variant, he is not a long term OAC candidate & underwent LAAO / Watchman implant on 07/13/23.   Since last being seen in our clinic the patient reports doing well overall. He has been noted to have anemia at the [place]. He received iron infusion and is pending starting slow release iron.  Most recent Hgb 10 per his wife.  He denies blood / dark tarry stools but is pending referral to Dr. Larita from GI.  He notes he has bruised more than his liking but other than that, no issues post LAAO.     He denies chest pain, palpitations, dyspnea, PND, orthopnea, nausea, vomiting, dizziness, syncope, edema, weight gain, or early satiety.   Review of systems complete and found to be negative unless listed in HPI.   EP Information / Studies Reviewed:    EKG is not ordered today. EKG from 06/27/23 reviewed which showed SR with 1st degree AVB with PAC's      Studies:  LHC 11/2021 > 3 vessel obstructive CAD, all grafts patent, no significant change since 2020 ECHO LVEF 60-65%, G1DD, s/p 23 mm Edwards porcine AVR with nml function CT Cardiac 03/31/23 > LA normal size, LAA large broccoli type with two lobes and ostial size of 15.8 mm & length 36.5 mm.  RA normal size. No PFO/ASD, normal pulmonary vein drainage, CAC score of 2631 which is 88th percentile for matched controls EPS 04/07/2023 > PVI ablation, ablation of the posterior wall, &  CTI LAAO 07/13/23 > 27 mm Watchman  CT Cardiac Morph/Pulm 10/19/23 > LAAO with complete seal   Arrhythmia / AAD AF  AFL    Risk Assessment/Calculations:    CHA2DS2-VASc Score = 8   This indicates a 10.8% annual risk of stroke. The patient's score is based upon: CHF History: 1 HTN History: 1 Diabetes History: 1 Stroke History: 2 Vascular Disease History: 1 Age Score: 2 Gender Score: 0             Physical Exam:   VS:  BP 130/62   Pulse 61   Wt 186 lb (84.4 kg)   SpO2 98%   BMI 28.28 kg/m    Wt Readings from Last 3 Encounters:  01/11/24 186 lb (84.4 kg)  01/10/24 184 lb (83.5 kg)  01/03/24 182 lb 12.8 oz (82.9 kg)     GEN: pleasant, well nourished, well developed in no acute distress NECK: No JVD; No carotid bruits CARDIAC: Regular rate and rhythm, no murmurs, rubs, gallops RESPIRATORY:  Clear to auscultation without rales, wheezing or rhonchi  ABDOMEN: Soft, non-tender, non-distended EXTREMITIES:  No edema; No deformity. Scant bruising on forearms.   ASSESSMENT AND PLAN:    Paroxysmal Atrial Fibrillation s/p Watchman  AFL S/p PVI / CT ablation 04/2023  Watchman 27 mm FLX Pro Device / LAAO 07/13/23 -completed Plavix  x6 months > transition to ASA 81 mg daily given baseline CV  risk  -no bleeding issues on plavix  + ASA    -device in place on CT from 10/19/23 with complete seal -will need f/u in 1 year and 2 year mark post LAAO  CAD s/p CABG VHD: Bioprosthetic AV  -per Cardiology   Hypertension  -well controlled on current regimen   Follow up with Dr. Cindie / EP APP in 12 months  Signed, Daphne Barrack, NP-C, AGACNP-BC [facility] - Electrophysiology  01/11/2024, 10:02 AM

## 2024-01-10 ENCOUNTER — Ambulatory Visit

## 2024-01-10 VITALS — BP 146/65 | HR 55 | Temp 98.3°F | Resp 18 | Ht 68.0 in | Wt 184.0 lb

## 2024-01-10 DIAGNOSIS — D509 Iron deficiency anemia, unspecified: Secondary | ICD-10-CM | POA: Diagnosis not present

## 2024-01-10 DIAGNOSIS — G8929 Other chronic pain: Secondary | ICD-10-CM

## 2024-01-10 DIAGNOSIS — D500 Iron deficiency anemia secondary to blood loss (chronic): Secondary | ICD-10-CM

## 2024-01-10 DIAGNOSIS — G43719 Chronic migraine without aura, intractable, without status migrainosus: Secondary | ICD-10-CM

## 2024-01-10 MED ORDER — SODIUM CHLORIDE 0.9 % IV SOLN
510.0000 mg | Freq: Once | INTRAVENOUS | Status: AC
  Administered 2024-01-10: 510 mg via INTRAVENOUS
  Filled 2024-01-10: qty 17

## 2024-01-10 NOTE — Progress Notes (Signed)
 Diagnosis: Iron Deficiency Anemia  Provider:  Mannam, Praveen MD  Procedure: IV Infusion  IV Type: Peripheral, IV Location: R Antecubital  Boniva (Ibandronate Sodium), Feraheme (Ferumoxytol ), Dose: 510 mg  Infusion Start Time: 1350  Infusion Stop Time: 1410  Post Infusion IV Care: Patient declined observation and Peripheral IV Discontinued  Discharge: Condition: Good, Destination: Home . AVS Declined  Performed by:  Rocky FORBES Sar, RN

## 2024-01-11 ENCOUNTER — Ambulatory Visit: Attending: Pulmonary Disease | Admitting: Pulmonary Disease

## 2024-01-11 ENCOUNTER — Encounter: Payer: Self-pay | Admitting: Pulmonary Disease

## 2024-01-11 VITALS — BP 130/62 | HR 61 | Wt 186.0 lb

## 2024-01-11 DIAGNOSIS — Z95818 Presence of other cardiac implants and grafts: Secondary | ICD-10-CM

## 2024-01-11 DIAGNOSIS — I10 Essential (primary) hypertension: Secondary | ICD-10-CM | POA: Diagnosis not present

## 2024-01-11 DIAGNOSIS — I25118 Atherosclerotic heart disease of native coronary artery with other forms of angina pectoris: Secondary | ICD-10-CM | POA: Diagnosis not present

## 2024-01-11 DIAGNOSIS — D509 Iron deficiency anemia, unspecified: Secondary | ICD-10-CM | POA: Diagnosis not present

## 2024-01-11 DIAGNOSIS — Z951 Presence of aortocoronary bypass graft: Secondary | ICD-10-CM

## 2024-01-11 DIAGNOSIS — I480 Paroxysmal atrial fibrillation: Secondary | ICD-10-CM | POA: Diagnosis not present

## 2024-01-11 MED ORDER — ASPIRIN 81 MG PO TBEC: 81.0000 mg | DELAYED_RELEASE_TABLET | Freq: Every day | ORAL | Status: DC

## 2024-01-11 NOTE — Patient Instructions (Signed)
 Medication Instructions:  1.Stop plavix  2.Start over the counter aspirin  81 mg daily *If you need a refill on your cardiac medications before your next appointment, please call your pharmacy*  Lab Work: None ordered If you have labs (blood work) drawn today and your tests are completely normal, you will receive your results only by: MyChart Message (if you have MyChart) OR A paper copy in the mail If you have any lab test that is abnormal or we need to change your treatment, we will call you to review the results.  Follow-Up: At [facility], you and your health needs are our priority.  As part of our continuing mission to provide you with exceptional heart care, our providers are all part of one team.  This team includes your primary Cardiologist (physician) and Advanced Practice Providers or APPs (Physician Assistants and Nurse Practitioners) who all work together to provide you with the care you need, when you need it.  Your next appointment:   1 year(s)  Provider:   Daphne Barrack, NP

## 2024-01-16 NOTE — Progress Notes (Signed)
 Assessment/Plan:    1.  Essential Tremor  -Status post focused ultrasound to the right VIM at [facility] in January, 2022. I do think he is more unbalanced than he was prior to the surgery, and would be very nervous about him attempting to do the other side.  -restart  primidone  50 mg twice per day.  Titration schedule given.  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  Chronic migraine  - Patient following with [facility] headache clinic, Dr. Deretha  -receiving cervical injections with triangle pain, as believes that there is a component of cervicogenic headache.  3.  Chronic LBP as well as left shoulder pain  - Has previously followed with Dr. Vernetta and had injections with Dr. Eldonna  4.  History of paroxysmal A-fib  - Status post Watchman device February, 2025.  No longer on anticoagulation  5.  Anemia, iron deficiency  - He is receiving IV iron  Subjective:   Troy Banks was seen today in follow up for follow-up chronic migraine, status post Botox  on Oct 16, 2020.  Pt with sig other, who supplements hx. Pt last seen 2 years ago.  Patient is status post focused ultrasound at [facility] in January, 2022.  At my last visit, he talked about going back to have the left VIM done and I told him he would just need to call [facility] and make a follow-up, but did express my concern about his balance.  Similarly, he was still on the primidone  and gabapentin .  He is off of the primidone  now and on gabapentin  for other reasons.  He has been seen by Dr. Deretha at [facility] multiple times since I last saw the patient.  He was most recently seen July 1.  Notes are reviewed.  It has been thought that the patient's treatment resistant headaches have a cervicogenic component.  He was sent to pain management for injections/nerve blocks given history of C5-C6 fusion.  Medication overuse headache was also mention due to Excedrin.  He has been seen by cardiology since last visit.  He had a  Watchman device placed in February, 2025.  He is no longer on apixaban .  He has been off x 2 weeks.  He has been off of primidone  16 months because of the apixaban .  Losing weight with ozempic  Current prescribed movement disorder medications: Primidone , 50 mg twice per day  Gabapentin , 300 mg at bedtime   PREVIOUS MEDICATIONS: Not beta-blocker candidate because of bradycardia; not topiramate candidate because of history of nephrolithiasis; primidone ; botox ; gabapentin ; vyepti ; Nurtec; Emgality; Aimovig  ALLERGIES:   Allergies  Allergen Reactions   Acyclovir And Related Other (See Comments)    Accumulates and causes stroke-like side-effects due to cytochrome P450 enzyme deficiency   Benzodiazepines Other (See Comments)    Patient states he gets stroke symptoms with benzo's.    Compazine [Prochlorperazine Maleate] Other (See Comments)    Accumulates and causes stroke-like side-effects due to cytochrome P450 enzyme deficiency   Coreg  [Carvedilol ] Other (See Comments)    Accumulates and causes stroke-like side-effects due to cytochrome P450 enzyme deficiency Patient poor metabolizer of CYP2D6 - Coreg  undergoes extensive hepatic (including 2D6)   Flomax  [Tamsulosin  Hcl] Other (See Comments)    Accumulates and causes stroke-like side-effects due to cytochrome P450 enzyme deficiency   Lisinopril Other (See Comments)    Hyperkalemia. 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   Mobic  [Meloxicam ]  Other (See Comments)    Accumulates and causes stroke-like side-effects due to cytochrome P450 enzyme deficiency   Morphine  And Codeine Other (See Comments)    Per pt's wife morphine  also contraindicated due to p450 enzyme deficiency.     Ondansetron  Other (See Comments)    Accumulates and causes stroke-like side-effects due to cytochrome P450 enzyme deficiency    Oxycodone  Other (See Comments)    Accumulates and causes stroke-like side-effects due to  cytochrome P450 enzyme deficiency   Promethazine  Other (See Comments)    Accumulates and causes stroke-like side-effects due to cytochrome P450 enzyme deficiency   Sertraline Hcl Other (See Comments)    Accumulates and causes stroke-like side-effects due to cytochrome P450 enzyme deficiency   Tramadol Other (See Comments)    Accumulates and causes stroke-like side-effects due to cytochrome P450 enzyme deficiency   Atorvastatin  Other (See Comments)    MYALGIAS   Colchicine  Other (See Comments)    ataxia   Fentanyl  Other (See Comments)    Accumulates and cause stroke like symptoms   Hydrocodone  Other (See Comments)    Accumulates and causes stroke-like side-effects due to cytochrome P450 enzyme deficiency    CURRENT MEDICATIONS:  Outpatient Encounter Medications as of 01/18/2024  Medication Sig   allopurinol  (ZYLOPRIM ) 300 MG tablet TAKE 1 TABLET BY MOUTH ONCE DAILY   amLODipine  (NORVASC ) 10 MG tablet TAKE 1 TABLET BY MOUTH EVERY DAY   amoxicillin  (AMOXIL ) 500 MG tablet Take 4 tablets by mouth one hour prior to dental procedure (Patient taking differently: Take 500 mg by mouth as needed. Take 4 tablets by mouth one hour prior to dental procedure)   Ascorbic Acid  (VITAMIN C ) 1000 MG tablet Take 1,000 mg by mouth daily.   aspirin  EC 81 MG tablet Take 81 mg by mouth every other day. Swallow whole.   Blood Glucose Monitoring Suppl (ONETOUCH VERIO FLEX SYSTEM) w/Device KIT    Carboxymethylcellulose Sodium (LUBRICANT EYE DROPS OP) Place 1 drop into both eyes 4 (four) times daily as needed (dry/irritated eyes.). Ocean fresh   Cholecalciferol  (VITAMIN D -3 PO) Take 2,000 Units by mouth every other day.   Cyanocobalamin  (VITAMIN B-12) 5000 MCG TBDP Take 5,000 mcg by mouth every other day.   diclofenac  Sodium (VOLTAREN ) 1 % GEL Apply 2 g topically.   diltiazem  (CARDIZEM  CD) 240 MG 24 hr capsule TAKE 1 CAPSULE BY MOUTH DAILY   famotidine  (PEPCID ) 10 MG tablet Take 10 mg by mouth 2 (two) times daily.    famotidine  (PEPCID ) 40 MG tablet TAKE 1 TABLET BY MOUTH ONCE DAILY   Ferrous Sulfate  (SLOW RELEASE IRON) 50 MG TBCR Take 1 tab by mouth once daily   furosemide  (LASIX ) 40 MG tablet TAKE 1 TABLET BY MOUTH EVERY OTHER DAY   Galcanezumab-gnlm 120 MG/ML SOAJ Inject into the skin. INJECT 120MG  (1 PEN) SUBCUTANEOUSLY monthly   isosorbide  mononitrate (IMDUR ) 60 MG 24 hr tablet Take 1 tablet (60 mg total) by mouth daily.   Lancets (ONETOUCH DELICA PLUS LANCET30G) MISC check blood glucose DAILY   nitroGLYCERIN  (NITROSTAT ) 0.4 MG SL tablet PLACE 1 TABLET UNDER THE TONGUE EVERY 5 MINUTES AS NEEDED FOR CHEST PAIN.   NON FORMULARY Pt uses a cpap nightly   ONETOUCH VERIO test strip    primidone  (MYSOLINE ) 50 MG tablet Take 1 tablet (50 mg total) by mouth 2 (two) times daily.   Rimegepant Sulfate (NURTEC PO) Take by mouth daily as needed.   rosuvastatin  (CRESTOR ) 10 MG tablet TAKE 1/2 TABLET BY MOUTH EVERY  DAY   Semaglutide,0.25 or 0.5MG /DOS, (OZEMPIC, 0.25 OR 0.5 MG/DOSE,) 2 MG/3ML SOPN Inject 0.5 mLs as directed once a week.   terbinafine (LAMISIL) 250 MG tablet Take 250 mg by mouth once a week. Patient taking every other week   tiZANidine  (ZANAFLEX ) 4 MG tablet Take 4 mg by mouth daily as needed for muscle spasms.   Turmeric 500 MG CAPS Take 500 mg by mouth daily.    [DISCONTINUED] aspirin  EC 81 MG tablet Take 1 tablet (81 mg total) by mouth daily. Swallow whole.   levothyroxine  (SYNTHROID ) 25 MCG tablet TAKE 1 TABLET BY MOUTH EVERY MORNING WITH BREAKFAST (Patient not taking: Reported on 01/18/2024)   [DISCONTINUED] ranitidine  (ZANTAC ) 150 MG tablet TAKE 1 TABLET BY MOUTH 2 TIMES DAILY (Patient not taking: Reported on 08/30/2023)   No facility-administered encounter medications on file as of 01/18/2024.     Objective:    PHYSICAL EXAMINATION:    VITALS:   Vitals:   01/18/24 1054  BP: (!) 144/84  Pulse: 74  SpO2: 96%  Weight: 181 lb 1.6 oz (82.1 kg)    GEN:  The patient appears stated age  and is in NAD. HEENT:  Normocephalic, atraumatic.  The mucous membranes are moist.   Neurological examination:  Orientation: The patient is alert and oriented x3.  He is slow to respond and looks to wife for assist Cranial nerves: There is good facial symmetry. The speech is fluent and clear. Soft palate rises symmetrically and there is no tongue deviation. Hearing is intact to conversational tone. Sensation: Sensation is intact to light touch throughout Motor: Strength is at least antigravity x4.  Movement examination: Tone: There is normal tone in the UE/LE Abnormal movements: There is mild postural tremor on the right.  Very little tremor on the left.  He has trouble with Archimedes spirals, mostly with getting the pen on the paper on the right hand.  He is left-hand dominant.  Coordination:  There is no decremation with RAM's,  Gait and Station: The patient is wide-based and minimally unbalanced.  He does have trouble getting out of the chair, but is able to do this.  He ambulates with his cane.   Chemistry      Component Value Date/Time   NA 144 10/09/2023 1205   K 4.9 10/09/2023 1205   CL 106 10/09/2023 1205   CO2 22 10/09/2023 1205   BUN 11 10/09/2023 1205   CREATININE 0.95 10/09/2023 1205   CREATININE 0.94 03/04/2016 0918      Component Value Date/Time   CALCIUM  9.8 10/09/2023 1205   ALKPHOS 66 06/28/2023 1522   AST 28 06/28/2023 1522   ALT 28 06/28/2023 1522   BILITOT 1.3 (H) 06/28/2023 1522   BILITOT 1.1 06/27/2023 1209      Lab Results  Component Value Date   WBC 10.2 12/27/2023   HGB 10.3 (L) 12/27/2023   HCT 30.9 (L) 12/27/2023   MCV 93.0 12/27/2023   PLT 236.0 12/27/2023   Lab Results  Component Value Date   TSH 4.45 06/28/2023     Chemistry      Component Value Date/Time   NA 144 10/09/2023 1205   K 4.9 10/09/2023 1205   CL 106 10/09/2023 1205   CO2 22 10/09/2023 1205   BUN 11 10/09/2023 1205   CREATININE 0.95 10/09/2023 1205   CREATININE 0.94  03/04/2016 0918      Component Value Date/Time   CALCIUM  9.8 10/09/2023 1205   ALKPHOS 66 06/28/2023 1522  AST 28 06/28/2023 1522   ALT 28 06/28/2023 1522   BILITOT 1.3 (H) 06/28/2023 1522   BILITOT 1.1 06/27/2023 1209      Lab Results  Component Value Date   ESRSEDRATE 4 07/22/2020     Total time spent on today's visit was 33 minutes, including both face-to-face time and nonface-to-face time.  Time included that spent on review of records (prior notes available to me/labs/imaging if pertinent), discussing treatment and goals, answering patient's questions and coordinating care.  Cc:  Norleen Lynwood ORN, MD

## 2024-01-18 ENCOUNTER — Ambulatory Visit: Admitting: Neurology

## 2024-01-18 ENCOUNTER — Encounter: Payer: Self-pay | Admitting: Neurology

## 2024-01-18 VITALS — BP 144/84 | HR 74 | Wt 181.1 lb

## 2024-01-18 DIAGNOSIS — G250 Essential tremor: Secondary | ICD-10-CM | POA: Diagnosis not present

## 2024-01-18 DIAGNOSIS — D509 Iron deficiency anemia, unspecified: Secondary | ICD-10-CM

## 2024-01-18 DIAGNOSIS — I480 Paroxysmal atrial fibrillation: Secondary | ICD-10-CM

## 2024-01-18 DIAGNOSIS — G43709 Chronic migraine without aura, not intractable, without status migrainosus: Secondary | ICD-10-CM | POA: Diagnosis not present

## 2024-01-18 DIAGNOSIS — E8889 Other specified metabolic disorders: Secondary | ICD-10-CM

## 2024-01-18 MED ORDER — PRIMIDONE 50 MG PO TABS: 50.0000 mg | ORAL_TABLET | Freq: Two times a day (BID) | ORAL | 2 refills | Status: DC

## 2024-01-18 NOTE — Patient Instructions (Signed)
 Restart primidone , 50 mg, 1/2 tablet at bed x 1 week, then 1 at bed x 1 week then 1 tablet twice per day

## 2024-01-19 ENCOUNTER — Encounter: Payer: Self-pay | Admitting: Internal Medicine

## 2024-01-19 NOTE — Telephone Encounter (Signed)
 Very sorry, we are unable to order lab done at a different private lab.   thanks

## 2024-01-24 DIAGNOSIS — M47812 Spondylosis without myelopathy or radiculopathy, cervical region: Secondary | ICD-10-CM | POA: Diagnosis not present

## 2024-01-24 DIAGNOSIS — E1165 Type 2 diabetes mellitus with hyperglycemia: Secondary | ICD-10-CM | POA: Diagnosis not present

## 2024-01-24 DIAGNOSIS — G4486 Cervicogenic headache: Secondary | ICD-10-CM | POA: Diagnosis not present

## 2024-01-30 DIAGNOSIS — E785 Hyperlipidemia, unspecified: Secondary | ICD-10-CM | POA: Diagnosis not present

## 2024-01-30 DIAGNOSIS — E1165 Type 2 diabetes mellitus with hyperglycemia: Secondary | ICD-10-CM | POA: Diagnosis not present

## 2024-01-30 DIAGNOSIS — I10 Essential (primary) hypertension: Secondary | ICD-10-CM | POA: Diagnosis not present

## 2024-01-30 DIAGNOSIS — E039 Hypothyroidism, unspecified: Secondary | ICD-10-CM | POA: Diagnosis not present

## 2024-02-15 DIAGNOSIS — N200 Calculus of kidney: Secondary | ICD-10-CM | POA: Diagnosis not present

## 2024-02-19 ENCOUNTER — Ambulatory Visit: Payer: Self-pay | Admitting: Gastroenterology

## 2024-02-19 ENCOUNTER — Other Ambulatory Visit (INDEPENDENT_AMBULATORY_CARE_PROVIDER_SITE_OTHER)

## 2024-02-19 ENCOUNTER — Encounter: Payer: Self-pay | Admitting: Gastroenterology

## 2024-02-19 ENCOUNTER — Ambulatory Visit: Admitting: Gastroenterology

## 2024-02-19 VITALS — BP 150/70 | HR 59 | Ht 68.0 in | Wt 195.5 lb

## 2024-02-19 DIAGNOSIS — K625 Hemorrhage of anus and rectum: Secondary | ICD-10-CM | POA: Diagnosis not present

## 2024-02-19 DIAGNOSIS — D509 Iron deficiency anemia, unspecified: Secondary | ICD-10-CM | POA: Diagnosis not present

## 2024-02-19 DIAGNOSIS — D500 Iron deficiency anemia secondary to blood loss (chronic): Secondary | ICD-10-CM | POA: Diagnosis not present

## 2024-02-19 LAB — CBC WITH DIFFERENTIAL/PLATELET
Basophils Absolute: 0.1 K/uL (ref 0.0–0.1)
Basophils Relative: 1.1 % (ref 0.0–3.0)
Eosinophils Absolute: 0.2 K/uL (ref 0.0–0.7)
Eosinophils Relative: 2.4 % (ref 0.0–5.0)
HCT: 42.4 % (ref 39.0–52.0)
Hemoglobin: 14.1 g/dL (ref 13.0–17.0)
Lymphocytes Relative: 23.8 % (ref 12.0–46.0)
Lymphs Abs: 1.8 K/uL (ref 0.7–4.0)
MCHC: 33.2 g/dL (ref 30.0–36.0)
MCV: 90.1 fl (ref 78.0–100.0)
Monocytes Absolute: 0.8 K/uL (ref 0.1–1.0)
Monocytes Relative: 10.1 % (ref 3.0–12.0)
Neutro Abs: 4.7 K/uL (ref 1.4–7.7)
Neutrophils Relative %: 62.6 % (ref 43.0–77.0)
Platelets: 171 K/uL (ref 150.0–400.0)
RBC: 4.71 Mil/uL (ref 4.22–5.81)
RDW: 18.2 % — ABNORMAL HIGH (ref 11.5–15.5)
WBC: 7.5 K/uL (ref 4.0–10.5)

## 2024-02-19 LAB — IBC + FERRITIN
Ferritin: 67 ng/mL (ref 22.0–322.0)
Iron: 192 ug/dL — ABNORMAL HIGH (ref 42–165)
Saturation Ratios: 56.2 % — ABNORMAL HIGH (ref 20.0–50.0)
TIBC: 341.6 ug/dL (ref 250.0–450.0)
Transferrin: 244 mg/dL (ref 212.0–360.0)

## 2024-02-19 NOTE — Progress Notes (Addendum)
 [facility] Consult Note:  History: Troy Banks 02/19/2024  Referring provider: Norleen Lynwood ORN, MD  Reason for consult/chief complaint: Rectal Bleeding (June checked hemoglobin 10 pt was then put on ferrus sulfate,bright red blood also in stool pt states it only occurred once )   Subjective  Prior history:  Last saw Dr. Legrand in office November 2022 to discuss surveillance colonoscopy for history of polyps: 2 adenomatous polyps removed in 2011 (outside hospital) 12 adenomatous polyps removed on colonoscopy with Dr. Legrand April 2018 3 diminutive adenomatous polyps removed on colonoscopy with Dr. Legrand October 2019  Surgical records indicate history of difficult airway, so colonoscopy was planned at [facility] January 2023 The patient's wife called the day prior to that procedure to cancel with only explanation that patient decided not to proceed.   Discussed the use of AI scribe software for clinical note transcription with the patient, who gave verbal consent to proceed.  History of Present Illness Troy Banks is an 83 year old male with a history of colon polyps who presents with anemia and iron deficiency. He was referred by his primary care doctor for evaluation of anemia and iron deficiency.  He presents with anemia and low iron levels, first noted several months ago. He has been on iron supplements and received two ferritin infusions. There has been no recheck of blood counts since July 2025.  He experienced a single episode of rectal bleeding approximately three months ago, which he witnessed and then explained to his wife.  No recurrent bleeding since that episode.  No current issues with bowel movements, describing them as regular without difficulty.  No nausea, vomiting, or difficulty swallowing. He previously experienced a sensation of food being stuck but has not had this issue for the past six months.  His appetite has decreased  since starting Ozempic for diabetes, and he has experienced some weight loss.  No blood in the urine, except when passing kidney stones. 2 months ago found to have iron deficiency anemia by primary care    ROS:  Review of Systems  Constitutional:  Positive for fatigue. Negative for appetite change and unexpected weight change.  HENT:  Negative for mouth sores and voice change.   Eyes:  Negative for pain and redness.  Respiratory:  Negative for cough and shortness of breath.   Cardiovascular:  Negative for chest pain and palpitations.  Genitourinary:  Negative for dysuria and hematuria.  Musculoskeletal:  Positive for arthralgias. Negative for myalgias.  Skin:  Negative for pallor and rash.  Neurological:  Negative for weakness and headaches.  Hematological:  Negative for adenopathy.   He says that he is thinking is slow and he needs time to process things. His wife Nena was present today and confirms that he has some short-term memory challenges.  Past Medical History: Past Medical History:  Diagnosis Date   Allergy    Anxiety    PTSD   Aortic stenosis, moderate 03/27/2015   post AVR echo Echo 11/16: Mild LVH, EF 55-60%, normal wall motion, grade 1 diastolic dysfunction, bioprosthetic AVR okay (mean gradient 18 mmHg) with trivial AI, mild LAE, atrial septal aneurysm, small effusion    Arthritis    qwhere (10/29/2015)   Cancer (HCC)    skin cancer   Carotid artery stenosis    Carotid US  11/17: 1-39% bilateral ICA stenosis. F/u prn   Cervical stenosis of spinal canal    fusion 2006   CHF (congestive heart failure) (HCC)  secondry to OHS   Closed left pilon fracture    COLONIC POLYPS, HX OF    Complication of anesthesia    takes a lot to put him under and has woken up during surgery Difficult to wake up . PTSD; does not metabolize RX wellthat he gets violent, per pt.    Coronary artery disease    Depression    Difficult intubation 03/30/2015   Glidescop  and 4 used 10/28/22, 04/07/23 due to history   DIVERTICULOSIS, COLON    Dyspnea    Dysrhythmia    2 bouts of afib post op and at home but corrected with medication.   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 Cindie   PTSD (post-traumatic stress disorder)    PTSD (post-traumatic stress disorder)    Sleep apnea    On a CPAP   01/11/2024 EP/cardiology office note reviewed.  Outlines patient's cardiac history in detail.  Since I last saw him, he underwent A-fib ablation November 2024 and placement of Watchman device February 2025  Past Surgical History: Past Surgical History:  Procedure Laterality Date   ANKLE FUSION Left 03/17/2017   Procedure: FUSION  PILON FRACTURE WITH BONE GRAFTING;  Surgeon: Kendal Franky SQUIBB, MD;  Location: [facility] OR;  Service: Orthopedics;  Laterality: Left;   ANTERIOR FUSION CERVICAL SPINE  2002   AORTIC VALVE REPLACEMENT N/A 03/30/2015   Procedure: AORTIC VALVE REPLACEMENT (AVR);  Surgeon: Dallas KATHEE Jude, MD;  Location: [facility] OR;  Service: Open Heart Surgery;  Laterality: N/A;   ATRIAL FIBRILLATION ABLATION N/A 04/07/2023   Procedure: ATRIAL FIBRILLATION ABLATION;  Surgeon: Cindie Ole DASEN, MD;  Location: [facility] INVASIVE CV LAB;  Service: Cardiovascular;  Laterality: N/A;    CARDIAC CATHETERIZATION N/A 03/26/2015   Procedure: Left Heart Cath and Coronary Angiography;  Surgeon: Alm LELON Clay, MD;  Location: [facility] INVASIVE CV LAB;  Service: Cardiovascular;  Laterality: N/A;   CARDIAC VALVE REPLACEMENT     CHOLECYSTECTOMY N/A 10/28/2022   Procedure: LAPAROSCOPIC CHOLECYSTECTOMY WITH INTRAOPERATIVE CHOLANGIOGRAM;  Surgeon: Gladis Cough, MD;  Location: [facility] ORS;  Service: General;  Laterality: N/A;   COLONOSCOPY W/ POLYPECTOMY     CORONARY ARTERY BYPASS GRAFT N/A 03/30/2015   Procedure: CORONARY ARTERY BYPASS GRAFTING (CABG) x four, using left internal mammary artery and left    leg greater saphenous vein harvested endoscopically ;  Surgeon: Dallas KATHEE Jude, MD;  Location: [facility] OR;  Service: Open Heart Surgery;  Laterality: N/A;   CORONARY/GRAFT ANGIOGRAPHY N/A 12/03/2021   Procedure: CORONARY/GRAFT ANGIOGRAPHY;  Surgeon: Swaziland, [name], MD;  Location: [facility] INVASIVE CV LAB;  Service: Cardiovascular;  Laterality: N/A;  CYSTOSCOPY/URETEROSCOPY/HOLMIUM LASER/STENT PLACEMENT Bilateral 11/07/2016   Procedure: CYSTOSCOPY/URETEROSCOPY/HOLMIUM LASER/STENT PLACEMENT;  Surgeon: Chauncey Redell Agent, MD;  Location: [facility] ORS;  Service: Urology;  Laterality: Bilateral;   CYSTOSCOPY/URETEROSCOPY/HOLMIUM LASER/STENT PLACEMENT Bilateral 11/23/2016   Procedure: CYSTOSCOPY/BILATERAL URETEROSCOPY/HOLMIUM LASER/STENT EXCHANGE;  Surgeon: Chauncey Redell Agent, MD;  Location: [facility] ORS;  Service: Urology;  Laterality: Bilateral;  NEEDS 90 MIN    EXTERNAL FIXATION LEG Left 02/22/2017   Procedure: EXTERNAL FIXATION LEFT ANKLE;  Surgeon: Beverley Evalene BIRCH, MD;  Location: [facility] OR;  Service: Orthopedics;  Laterality: Left;   HEMORRHOID BANDING  1970s   INGUINAL HERNIA REPAIR Left 1962   KIDNEY STONE SURGERY  1986   cut me open   LEFT ATRIAL APPENDAGE OCCLUSION N/A 07/13/2023   Procedure: LEFT ATRIAL APPENDAGE OCCLUSION;  Surgeon: Cindie Ole DASEN, MD;  [facility]: [facility] INVASIVE CV LAB;  Service: Cardiovascular;   Laterality: N/A;   LEFT HEART CATH AND CORS/GRAFTS ANGIOGRAPHY N/A 10/25/2018   Procedure: LEFT HEART CATH AND CORS/GRAFTS ANGIOGRAPHY;  Surgeon: Anner Alm ORN, MD;  Location: [facility] INVASIVE CV LAB;  Service: Cardiovascular;  Laterality: N/A;   LEFT HEART CATH AND CORS/GRAFTS ANGIOGRAPHY N/A 12/03/2021   Procedure: LEFT HEART CATH AND CORS/GRAFTS ANGIOGRAPHY;  Surgeon: Swaziland, [name], MD;  Location: [facility] INVASIVE CV LAB;  Service: Cardiovascular;  Laterality: N/A;   POSTERIOR FUSION CERVICAL SPINE  2006   TEE WITHOUT CARDIOVERSION N/A 03/30/2015   Procedure: TRANSESOPHAGEAL ECHOCARDIOGRAM (TEE);  Surgeon: Dallas KATHEE Jude, MD;  Location: [facility] OR;  Service: Open Heart Surgery;  Laterality: N/A;   TESTICLE TORSION REDUCTION Right 1960   TOE FUSION Left 1985 & 2004   great toe and removal   TONSILLECTOMY     TOTAL HIP ARTHROPLASTY Right 10/27/2015   Procedure: RIGHT TOTAL HIP ARTHROPLASTY ANTERIOR APPROACH and steroid injection right foot;  Surgeon: Lonni CINDERELLA Poli, MD;  Location: [facility] OR;  Service: Orthopedics;  Laterality: Right;   TOTAL HIP ARTHROPLASTY Left 08/13/2019   TOTAL HIP ARTHROPLASTY Left 08/13/2019   Procedure: LEFT TOTAL HIP ARTHROPLASTY ANTERIOR APPROACH;  Surgeon: Poli Lonni CINDERELLA, MD;  Location: [facility] OR;  Service: Orthopedics;  Laterality: Left;   TRANSESOPHAGEAL ECHOCARDIOGRAM (CATH LAB) N/A 07/13/2023   Procedure: TRANSESOPHAGEAL ECHOCARDIOGRAM;  Surgeon: Cindie Ole DASEN, MD;  Location: [facility] INVASIVE CV LAB;  Service: Cardiovascular;  Laterality: N/A;     Family History: Family History  Problem Relation Age of Onset   Heart disease Mother    Hypertension Mother    Diabetes Mother    Heart disease Father    Hypertension Father    Tremor Sister    Healthy Brother    Healthy Son    Prostate cancer Maternal Uncle    Hypertension Maternal Grandmother    Heart disease Maternal Grandmother    Hypertension Maternal Grandfather    Heart disease Maternal Grandfather     Hypertension Paternal Grandfather    Heart disease Paternal Grandfather    Diabetes Paternal Grandfather    Alcohol abuse Other    Arthritis Other    Drug abuse Sister    Healthy Daughter    Colon polyps Neg Hx    Esophageal cancer Neg Hx    Rectal cancer Neg Hx    Stomach cancer Neg Hx    Colon cancer Neg Hx     Social History: Social History   Socioeconomic History   Marital status: Married    Spouse name: Nena Plants, RN   Number of children: 2   Years of education: Not on  file   Highest education level: Some college, no degree  Occupational History   Occupation: Retired Economist  Tobacco Use   Smoking status: Some Days    Current packs/day: 0.00    Average packs/day: 1 pack/day for 34.0 years (34.0 ttl pk-yrs)    Types: Cigarettes    Start date: 06/06/1956    Last attempt to quit: 06/06/1990    Years since quitting: 33.7   Smokeless tobacco: Never  Vaping Use   Vaping status: Never Used  Substance and Sexual Activity   Alcohol use: Yes    Alcohol/week: 3.0 standard drinks of alcohol    Types: 1 Cans of beer, 2 Shots of liquor per week    Comment: occassional   Drug use: No   Sexual activity: Yes  Other Topics Concern   Not on file  Social History Narrative   Left Handed    One Story home    Lives with wife and 1 dog/2025   Social Drivers of Health   Financial Resource Strain: Low Risk  (10/06/2023)   Overall Financial Resource Strain (CARDIA)    Difficulty of Paying Living Expenses: Not hard at all  Food Insecurity: No Food Insecurity (10/06/2023)   Hunger Vital Sign    Worried About Running Out of Food in the Last Year: Never true    Ran Out of Food in the Last Year: Never true  Transportation Needs: No Transportation Needs (10/06/2023)   PRAPARE - Administrator, Civil Service (Medical): No    Lack of Transportation (Non-Medical): No  Physical Activity: Insufficiently Active (10/06/2023)   Exercise Vital Sign    Days of Exercise  per Week: 2 days    Minutes of Exercise per Session: 30 min  Stress: Stress Concern Present (10/06/2023)   [employer] of Occupational Health - Occupational Stress Questionnaire    Feeling of Stress : To some extent  Social Connections: Moderately Integrated (10/06/2023)   Social Connection and Isolation Panel    Frequency of Communication with Friends and Family: Never    Frequency of Social Gatherings with Friends and Family: Once a week    Attends Religious Services: More than 4 times per year    Active Member of [employer] or Organizations: Yes    Attends Banker Meetings: 1 to 4 times per year    Marital Status: Married    Allergies: Allergies  Allergen Reactions   Acyclovir And Related Other (See Comments)    Accumulates and causes stroke-like side-effects due to cytochrome P450 enzyme deficiency   Benzodiazepines Other (See Comments)    Patient states he gets stroke symptoms with benzo's.    Compazine [Prochlorperazine Maleate] Other (See Comments)    Accumulates and causes stroke-like side-effects due to cytochrome P450 enzyme deficiency   Coreg  [Carvedilol ] Other (See Comments)    Accumulates and causes stroke-like side-effects due to cytochrome P450 enzyme deficiency Patient poor metabolizer of CYP2D6 - Coreg  undergoes extensive hepatic (including 2D6)   Flomax  [Tamsulosin  Hcl] Other (See Comments)    Accumulates and causes stroke-like side-effects due to cytochrome P450 enzyme deficiency   Lisinopril Other (See Comments)    Hyperkalemia. 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   Mobic  [Meloxicam ] Other (See Comments)    Accumulates and causes stroke-like side-effects due to cytochrome P450 enzyme deficiency   Morphine  And Codeine Other (See Comments)    Per pt's wife morphine  also contraindicated due to p450 enzyme deficiency.  Ondansetron  Other (See Comments)    Accumulates and causes stroke-like  side-effects due to cytochrome P450 enzyme deficiency    Oxycodone  Other (See Comments)    Accumulates and causes stroke-like side-effects due to cytochrome P450 enzyme deficiency   Promethazine  Other (See Comments)    Accumulates and causes stroke-like side-effects due to cytochrome P450 enzyme deficiency   Sertraline Hcl Other (See Comments)    Accumulates and causes stroke-like side-effects due to cytochrome P450 enzyme deficiency   Tamsulosin  Itching    tamsulosin    Tramadol Other (See Comments)    Accumulates and causes stroke-like side-effects due to cytochrome P450 enzyme deficiency   Atorvastatin  Other (See Comments)    MYALGIAS   Colchicine  Other (See Comments)    ataxia   Fentanyl  Other (See Comments)    Accumulates and cause stroke like symptoms   Hydrocodone  Other (See Comments)    Accumulates and causes stroke-like side-effects due to cytochrome P450 enzyme deficiency    Outpatient Meds: Current Outpatient Medications  Medication Sig Dispense Refill   allopurinol  (ZYLOPRIM ) 300 MG tablet TAKE 1 TABLET BY MOUTH ONCE DAILY 90 tablet 3   amLODipine  (NORVASC ) 10 MG tablet TAKE 1 TABLET BY MOUTH EVERY DAY 90 tablet 3   amoxicillin  (AMOXIL ) 500 MG tablet Take 4 tablets by mouth one hour prior to dental procedure (Patient taking differently: Take 500 mg by mouth as needed. Take 4 tablets by mouth one hour prior to dental procedure) 4 tablet 1   Ascorbic Acid  (VITAMIN C ) 1000 MG tablet Take 1,000 mg by mouth daily.     aspirin  EC 81 MG tablet Take 81 mg by mouth every other day. Swallow whole.     Blood Glucose Monitoring Suppl (ONETOUCH VERIO FLEX SYSTEM) w/Device KIT      Carboxymethylcellulose Sodium (LUBRICANT EYE DROPS OP) Place 1 drop into both eyes 4 (four) times daily as needed (dry/irritated eyes.). Ocean fresh     Cholecalciferol  (VITAMIN D -3 PO) Take 2,000 Units by mouth every other day.     Cyanocobalamin  (VITAMIN B-12) 5000 MCG TBDP Take 5,000 mcg by mouth every  other day.     diclofenac  Sodium (VOLTAREN ) 1 % GEL Apply 2 g topically.     diltiazem  (CARDIZEM  CD) 240 MG 24 hr capsule TAKE 1 CAPSULE BY MOUTH DAILY 90 capsule 3   famotidine  (PEPCID ) 10 MG tablet Take 10 mg by mouth 2 (two) times daily.     famotidine  (PEPCID ) 40 MG tablet TAKE 1 TABLET BY MOUTH ONCE DAILY 90 tablet 3   Ferrous Sulfate  (SLOW RELEASE IRON) 50 MG TBCR Take 1 tab by mouth once daily 30 tablet 11   furosemide  (LASIX ) 40 MG tablet TAKE 1 TABLET BY MOUTH EVERY OTHER DAY 45 tablet 2   gabapentin  (NEURONTIN ) 800 MG tablet TAKE 1 TABLET AT NIGHT PRN     Galcanezumab-gnlm 120 MG/ML SOAJ Inject into the skin. INJECT 120MG  (1 PEN) SUBCUTANEOUSLY monthly     isosorbide  mononitrate (IMDUR ) 60 MG 24 hr tablet Take 1 tablet (60 mg total) by mouth daily. 90 tablet 3   Lancets (ONETOUCH DELICA PLUS LANCET30G) MISC check blood glucose DAILY     levothyroxine  (SYNTHROID ) 25 MCG tablet TAKE 1 TABLET BY MOUTH EVERY MORNING WITH BREAKFAST 90 tablet 3   nitroGLYCERIN  (NITROSTAT ) 0.4 MG SL tablet PLACE 1 TABLET UNDER THE TONGUE EVERY 5 MINUTES AS NEEDED FOR CHEST PAIN. 25 tablet 3   NON FORMULARY Pt uses a cpap nightly     ONETOUCH VERIO test  strip      primidone  (MYSOLINE ) 50 MG tablet Take 1 tablet (50 mg total) by mouth 2 (two) times daily. 180 tablet 2   Rimegepant Sulfate (NURTEC PO) Take by mouth daily as needed.     rosuvastatin  (CRESTOR ) 10 MG tablet TAKE 1/2 TABLET BY MOUTH EVERY DAY 45 tablet 3   Semaglutide,0.25 or 0.5MG /DOS, (OZEMPIC, 0.25 OR 0.5 MG/DOSE,) 2 MG/3ML SOPN Inject 0.5 mLs as directed once a week.     terbinafine (LAMISIL) 250 MG tablet Take 250 mg by mouth once a week. Patient taking every other week     tiZANidine  (ZANAFLEX ) 4 MG tablet Take 4 mg by mouth daily as needed for muscle spasms.     Turmeric 500 MG CAPS Take 500 mg by mouth daily.      No current facility-administered medications for this visit.       ___________________________________________________________________ Objective   Exam:  BP (!) 150/70   Pulse (!) 59   Ht 5' 8 (1.727 m)   Wt 195 lb 8 oz (88.7 kg)   BMI 29.73 kg/m  Wt Readings from Last 3 Encounters:  02/19/24 195 lb 8 oz (88.7 kg)  01/18/24 181 lb 1.6 oz (82.1 kg)  01/11/24 186 lb (84.4 kg)    General: Chronically ill-appearing elderly man.  Ambulates slowly with a walker, and can get on the low exam table independently Eyes: sclera anicteric, no redness ENT: oral mucosa moist without lesions, no cervical or supraclavicular lymphadenopathy CV: Regular with soft systolic murmur, no JVD, no peripheral edema Resp: clear to auscultation bilaterally, normal RR and effort noted GI: soft, + tenderness left midline anterior rib cage, with active bowel sounds. No guarding or palpable organomegaly or masses noted. Skin; warm and dry, no rash or jaundice noted.  Easy bruising, particularly on forearms Neuro: awake, alert and oriented x 3.  Slow mentation   Labs:     Latest Ref Rng & Units 12/27/2023   12:23 PM 06/27/2023   12:09 PM 03/22/2023   11:00 AM  CBC  WBC 4.0 - 10.5 K/uL 10.2  7.8  7.2   Hemoglobin 13.0 - 17.0 g/dL 89.6  85.8  86.7   Hematocrit 39.0 - 52.0 % 30.9  42.4  41.7   Platelets 150.0 - 400.0 K/uL 236.0  210  235       Latest Ref Rng & Units 10/09/2023   12:05 PM 08/30/2023   10:02 AM 06/28/2023    3:22 PM  CMP  Glucose 70 - 99 mg/dL 866  842    BUN 8 - 27 mg/dL 11  13    Creatinine 9.23 - 1.27 mg/dL 9.04  9.05    Sodium 865 - 144 mmol/L 144  140    Potassium 3.5 - 5.2 mmol/L 4.9  4.4    Chloride 96 - 106 mmol/L 106  101    CO2 20 - 29 mmol/L 22  24    Calcium  8.6 - 10.2 mg/dL 9.8  9.5    Total Protein 6.0 - 8.3 g/dL   6.4   Total Bilirubin 0.2 - 1.2 mg/dL   1.3   Alkaline Phos 39 - 117 U/L   66   AST 0 - 37 U/L   28   ALT 0 - 53 U/L   28    Iron/TIBC/Ferritin/ %Sat    Component Value Date/Time   IRON 27 (L) 12/27/2023 1223    TIBC 378.0 12/27/2023 1223   FERRITIN 13.0 (L) 12/27/2023 1223  IRONPCTSAT 7.1 (L) 12/27/2023 1223   Had IV iron x 2, then oral iron    Encounter Diagnoses  Name Primary?   Iron deficiency anemia due to chronic blood loss Yes   Rectal bleeding     Assessment and Plan Assessment & Plan Iron deficiency anemia evaluation Iron deficiency anemia with low RBC and iron levels. Concern for GI blood loss. Differential includes GI bleeding sources like ulcers, polyps, or AVMs. Endoscopic evaluation indicated despite higher procedural risks due to age and cardiac conditions. - Order blood tests to recheck blood counts and iron levels. - Discussed potential endoscopic evaluation including colonoscopy and upper endoscopy. - Discussed risks and benefits of endoscopic procedures. His procedures would need to be done in the outpatient hospital endoscopy department given his medical comorbidities and high risk airway/history of difficult intubation.  Rectal bleeding, remote single episode Single episode of rectal bleeding three months ago. Likely benign but requires further evaluation due to anemia and history of polyps. - Consider colonoscopy to evaluate for potential sources of rectal bleeding.   After thorough discussion about all this, Troy did not feel ready to make a decision about proceeding with EGD and colonoscopy. He needs some time to think about it more and discuss it with his wife Nena.  They were agreeable to him having a CBC and iron studies checked today.  They feel that if those levels have improved (or even normalized) on current iron therapy, that would help them make a more informed decision about doing any procedures.   Thank you for the courtesy of this consult.  Please call me with any questions or concerns.  Troy Banks Brand III  CC: Referring provider noted above

## 2024-02-19 NOTE — Patient Instructions (Signed)
 Your provider has requested that you go to the basement level for lab work before leaving today. Press B on the elevator. The lab is located at the first door on the left as you exit the elevator.   _______________________________________________________  If your blood pressure at your visit was 140/90 or greater, please contact your primary care physician to follow up on this.  _______________________________________________________  If you are age 81 or older, your body mass index should be between 23-30. Your Body mass index is 29.73 kg/m. If this is out of the aforementioned range listed, please consider follow up with your Primary Care Provider.  If you are age 77 or younger, your body mass index should be between 19-25. Your Body mass index is 29.73 kg/m. If this is out of the aformentioned range listed, please consider follow up with your Primary Care Provider.   ________________________________________________________  The [facility] providers would like to encourage you to use MYCHART to communicate with providers for non-urgent requests or questions.  Due to long hold times on the telephone, sending your provider a message by [facility] may be a faster and more efficient way to get a response.  Please allow 48 business hours for a response.  Please remember that this is for non-urgent requests.  _______________________________________________________  [facility] is using a team-based approach to care.  Your team is made up of your doctor and two to three APPS. Our APPS (Nurse Practitioners and Physician Assistants) work with your physician to ensure care continuity for you. They are fully qualified to address your health concerns and develop a treatment plan. They communicate directly with your gastroenterologist to care for you. Seeing the Advanced Practice Practitioners on your physician's team can help you by facilitating care more promptly, often allowing for earlier  appointments, access to diagnostic testing, procedures, and other specialty referrals.    Thank you for trusting me with your gastrointestinal care!    Dr. Victory Legrand DOUGLAS [facility]

## 2024-03-06 ENCOUNTER — Other Ambulatory Visit: Payer: Self-pay | Admitting: Cardiology

## 2024-03-18 ENCOUNTER — Other Ambulatory Visit: Payer: Self-pay

## 2024-03-18 ENCOUNTER — Other Ambulatory Visit: Payer: Self-pay | Admitting: Internal Medicine

## 2024-03-27 DIAGNOSIS — H25813 Combined forms of age-related cataract, bilateral: Secondary | ICD-10-CM | POA: Diagnosis not present

## 2024-03-27 DIAGNOSIS — H353131 Nonexudative age-related macular degeneration, bilateral, early dry stage: Secondary | ICD-10-CM | POA: Diagnosis not present

## 2024-03-27 DIAGNOSIS — D3131 Benign neoplasm of right choroid: Secondary | ICD-10-CM | POA: Diagnosis not present

## 2024-03-27 DIAGNOSIS — H04123 Dry eye syndrome of bilateral lacrimal glands: Secondary | ICD-10-CM | POA: Diagnosis not present

## 2024-04-02 DIAGNOSIS — N200 Calculus of kidney: Secondary | ICD-10-CM | POA: Diagnosis not present

## 2024-04-02 DIAGNOSIS — R3121 Asymptomatic microscopic hematuria: Secondary | ICD-10-CM | POA: Diagnosis not present

## 2024-04-03 ENCOUNTER — Encounter: Payer: Self-pay | Admitting: Podiatry

## 2024-04-03 ENCOUNTER — Ambulatory Visit (INDEPENDENT_AMBULATORY_CARE_PROVIDER_SITE_OTHER): Admitting: Podiatry

## 2024-04-03 DIAGNOSIS — M79674 Pain in right toe(s): Secondary | ICD-10-CM | POA: Diagnosis not present

## 2024-04-03 DIAGNOSIS — B351 Tinea unguium: Secondary | ICD-10-CM | POA: Diagnosis not present

## 2024-04-03 DIAGNOSIS — E119 Type 2 diabetes mellitus without complications: Secondary | ICD-10-CM

## 2024-04-03 DIAGNOSIS — M79675 Pain in left toe(s): Secondary | ICD-10-CM

## 2024-04-03 NOTE — Progress Notes (Signed)
This patient returns to the office for evaluation and treatment of long thick painful nails .  This patient is unable to trim his own nails since the patient cannot reach the feet.  Patient says the nails are painful walking and wearing his shoes.  He returns for preventive foot care services.    General Appearance  Alert, conversant and in no acute stress.  Vascular  Dorsalis pedis and posterior tibial  pulses are palpable  bilaterally.  Capillary return is within normal limits  bilaterally. Temperature is within normal limits  bilaterally.  Neurologic  Senn-Weinstein monofilament wire test within normal limits  bilaterally. Muscle power within normal limits bilaterally.  Nails Thick disfigured discolored nails with subungual debris  from hallux to fifth toes bilaterally. No evidence of bacterial infection or drainage bilaterally.  Orthopedic  No limitations of motion  feet .  No crepitus or effusions noted.  No bony pathology or digital deformities noted.  Ankle/STJ immobile left foot/ankle.  Fused 1st MPJ right foot.  Skin  normotropic skin with no porokeratosis noted bilaterally.  No signs of infections or ulcers noted.     Onychomycosis  Pain in toes right foot  Pain in toes left foot  Debridement  of nails  1-5  B/L with a nail nipper.  Nails were then filed using a dremel tool with no incidents.    RTC  3 month   [name] DPM 

## 2024-04-08 ENCOUNTER — Encounter: Payer: Self-pay | Admitting: Radiology

## 2024-04-19 ENCOUNTER — Other Ambulatory Visit: Payer: Self-pay | Admitting: Cardiology

## 2024-05-13 ENCOUNTER — Other Ambulatory Visit: Payer: Self-pay | Admitting: Internal Medicine

## 2024-05-13 ENCOUNTER — Other Ambulatory Visit: Payer: Self-pay

## 2024-05-16 ENCOUNTER — Telehealth: Payer: Self-pay

## 2024-05-16 NOTE — Telephone Encounter (Signed)
 Spoke with patient's wife. She reports patient is feeling well as he is almost 1 year post LAAO. Arranged overdue recall with Dr. Jordan for 07/24/2024.

## 2024-06-27 ENCOUNTER — Encounter: Payer: Self-pay | Admitting: Internal Medicine

## 2024-06-27 ENCOUNTER — Ambulatory Visit: Payer: Self-pay | Admitting: Internal Medicine

## 2024-06-27 ENCOUNTER — Ambulatory Visit: Admitting: Internal Medicine

## 2024-06-27 VITALS — BP 134/100 | HR 65 | Temp 98.1°F | Ht 68.0 in | Wt 195.0 lb

## 2024-06-27 DIAGNOSIS — E559 Vitamin D deficiency, unspecified: Secondary | ICD-10-CM | POA: Diagnosis not present

## 2024-06-27 DIAGNOSIS — E7800 Pure hypercholesterolemia, unspecified: Secondary | ICD-10-CM

## 2024-06-27 DIAGNOSIS — Z0000 Encounter for general adult medical examination without abnormal findings: Secondary | ICD-10-CM

## 2024-06-27 DIAGNOSIS — E119 Type 2 diabetes mellitus without complications: Secondary | ICD-10-CM

## 2024-06-27 DIAGNOSIS — I10 Essential (primary) hypertension: Secondary | ICD-10-CM

## 2024-06-27 DIAGNOSIS — Z7985 Long-term (current) use of injectable non-insulin antidiabetic drugs: Secondary | ICD-10-CM

## 2024-06-27 DIAGNOSIS — E039 Hypothyroidism, unspecified: Secondary | ICD-10-CM

## 2024-06-27 DIAGNOSIS — M1040 Other secondary gout, unspecified site: Secondary | ICD-10-CM | POA: Diagnosis not present

## 2024-06-27 DIAGNOSIS — Z0001 Encounter for general adult medical examination with abnormal findings: Secondary | ICD-10-CM | POA: Diagnosis not present

## 2024-06-27 DIAGNOSIS — E538 Deficiency of other specified B group vitamins: Secondary | ICD-10-CM | POA: Diagnosis not present

## 2024-06-27 LAB — CBC WITH DIFFERENTIAL/PLATELET
Basophils Absolute: 0.1 K/uL (ref 0.0–0.1)
Basophils Relative: 1.1 % (ref 0.0–3.0)
Eosinophils Absolute: 0.1 K/uL (ref 0.0–0.7)
Eosinophils Relative: 0.9 % (ref 0.0–5.0)
HCT: 48 % (ref 39.0–52.0)
Hemoglobin: 15.8 g/dL (ref 13.0–17.0)
Lymphocytes Relative: 17.9 % (ref 12.0–46.0)
Lymphs Abs: 1.4 K/uL (ref 0.7–4.0)
MCHC: 33 g/dL (ref 30.0–36.0)
MCV: 92.7 fl (ref 78.0–100.0)
Monocytes Absolute: 0.8 K/uL (ref 0.1–1.0)
Monocytes Relative: 10.2 % (ref 3.0–12.0)
Neutro Abs: 5.3 K/uL (ref 1.4–7.7)
Neutrophils Relative %: 69.9 % (ref 43.0–77.0)
Platelets: 190 K/uL (ref 150.0–400.0)
RBC: 5.18 Mil/uL (ref 4.22–5.81)
RDW: 14.8 % (ref 11.5–15.5)
WBC: 7.6 K/uL (ref 4.0–10.5)

## 2024-06-27 LAB — URIC ACID: Uric Acid, Serum: 4.6 mg/dL (ref 4.0–7.8)

## 2024-06-27 LAB — HEPATIC FUNCTION PANEL
ALT: 20 U/L (ref 3–53)
AST: 18 U/L (ref 5–37)
Albumin: 4.4 g/dL (ref 3.5–5.2)
Alkaline Phosphatase: 58 U/L (ref 39–117)
Bilirubin, Direct: 0.2 mg/dL (ref 0.1–0.3)
Total Bilirubin: 1 mg/dL (ref 0.2–1.2)
Total Protein: 6.9 g/dL (ref 6.0–8.3)

## 2024-06-27 LAB — URINALYSIS, ROUTINE W REFLEX MICROSCOPIC
Bilirubin Urine: NEGATIVE
Hgb urine dipstick: NEGATIVE
Leukocytes,Ua: NEGATIVE
Nitrite: NEGATIVE
RBC / HPF: NONE SEEN
Specific Gravity, Urine: 1.02 (ref 1.000–1.030)
Total Protein, Urine: 100 — AB
Urine Glucose: NEGATIVE
Urobilinogen, UA: 1 (ref 0.0–1.0)
pH: 6 (ref 5.0–8.0)

## 2024-06-27 LAB — LIPID PANEL
Cholesterol: 117 mg/dL (ref 28–200)
HDL: 55.3 mg/dL
LDL Cholesterol: 47 mg/dL (ref 10–99)
NonHDL: 61.81
Total CHOL/HDL Ratio: 2
Triglycerides: 75 mg/dL (ref 10.0–149.0)
VLDL: 15 mg/dL (ref 0.0–40.0)

## 2024-06-27 LAB — BASIC METABOLIC PANEL WITH GFR
BUN: 17 mg/dL (ref 6–23)
CO2: 26 meq/L (ref 19–32)
Calcium: 9.8 mg/dL (ref 8.4–10.5)
Chloride: 104 meq/L (ref 96–112)
Creatinine, Ser: 1.03 mg/dL (ref 0.40–1.50)
GFR: 68.13 mL/min
Glucose, Bld: 135 mg/dL — ABNORMAL HIGH (ref 70–99)
Potassium: 4 meq/L (ref 3.5–5.1)
Sodium: 139 meq/L (ref 135–145)

## 2024-06-27 LAB — VITAMIN D 25 HYDROXY (VIT D DEFICIENCY, FRACTURES): VITD: 28.16 ng/mL — ABNORMAL LOW (ref 30.00–100.00)

## 2024-06-27 LAB — HEMOGLOBIN A1C: Hgb A1c MFr Bld: 6.3 % (ref 4.6–6.5)

## 2024-06-27 LAB — VITAMIN B12: Vitamin B-12: 803 pg/mL (ref 211–911)

## 2024-06-27 LAB — TSH: TSH: 4.03 u[IU]/mL (ref 0.35–5.50)

## 2024-06-27 MED ORDER — FAMOTIDINE 40 MG PO TABS: 40.0000 mg | ORAL_TABLET | Freq: Every day | ORAL | 3 refills | Status: AC

## 2024-06-27 NOTE — Progress Notes (Signed)
 The test results show that your current treatment is OK, as the tests are stable.  Please continue the same plan.  There is no other need for change of treatment or further evaluation based on these results, at this time.  thanks

## 2024-06-27 NOTE — Assessment & Plan Note (Signed)
 Lab Results  Component Value Date   TSH 4.03 06/27/2024   Stable, pt does not require med tx at this time

## 2024-06-27 NOTE — Assessment & Plan Note (Signed)
 BP Readings from Last 3 Encounters:  06/27/24 (!) 134/100  02/19/24 (!) 150/70  01/18/24 (!) 144/84   Uncontrolled here but wife states controlled at home, pt to continue medical treatment norvasc  10 qd

## 2024-06-27 NOTE — Patient Instructions (Signed)
 Please continue all other medications as before, and refills have been done for the pepcid   Please have the pharmacy call with any other refills you may need.  Please continue your efforts at being more active, low cholesterol diet, and weight control.  You are otherwise up to date with prevention measures today.  Please keep your appointments with your specialists as you may have planned  Please go to the LAB at the blood drawing area for the tests to be done  You will be contacted by phone if any changes need to be made immediately.  Otherwise, you will receive a letter about your results with an explanation, but please check with MyChart first.  Please make an Appointment to return in 6 months, or sooner if needed

## 2024-06-27 NOTE — Assessment & Plan Note (Signed)
 Age and sex appropriate education and counseling updated with regular exercise and diet Referrals for preventative services - declines colonoscopy,  Immunizations addressed - for shingrix at pharmacy, declines flu shot and covid booster Smoking counseling  - none needed Evidence for depression or other mood disorder - none significant Most recent labs reviewed. I have personally reviewed and have noted: 1) the patient's medical and social history 2) The patient's current medications and supplements 3) The patient's height, weight, and BMI have been recorded in the chart

## 2024-06-27 NOTE — Assessment & Plan Note (Signed)
 Lab Results  Component Value Date   LDLCALC 47 06/27/2024   Stable, pt to continue current statin crestor  5 mg qd

## 2024-06-27 NOTE — Assessment & Plan Note (Signed)
 Last vitamin D  Lab Results  Component Value Date   VD25OH 28.16 (L) 06/27/2024   Low, to start oral replacement

## 2024-06-27 NOTE — Progress Notes (Signed)
 Patient ID: Troy Banks, male   DOB: Sep 30, 1940, 84 y.o.   MRN: [id number]         Chief Complaint:: wellness exam and htn, low vit d       HPI:  Troy Banks is a 84 y.o. male here for wellness exam; declines colonoscopy, covid booster, flu shot and shingrix at pharmacy, o/w up to date                Also Pt denies chest pain, increased sob or doe, wheezing, orthopnea, PND, increased LE swelling, palpitations, dizziness or syncope.   Pt denies polydipsia, polyuria, or new focal neuro s/s.    Pt denies fever, wt loss, night sweats, loss of appetite, or other constitutional symptoms  Has f/u appt with Dr Paulina neurology feb 2026.  BP has been controlled at home per wife.    Wt Readings from Last 3 Encounters:  06/27/24 195 lb (88.5 kg)  02/19/24 195 lb 8 oz (88.7 kg)  01/18/24 181 lb 1.6 oz (82.1 kg)   BP Readings from Last 3 Encounters:  06/27/24 (!) 134/100  02/19/24 (!) 150/70  01/18/24 (!) 144/84   Immunization History  Administered Date(s) Administered   DT (Pediatric) 06/06/2008   PFIZER Comirnaty(Gray Top)Covid-19 Tri-Sucrose Vaccine 08/10/2020   PFIZER(Purple Top)SARS-COV-2 Vaccination 02/17/2020, 03/09/2020, 08/10/2020   Pneumococcal Conjugate-13 09/08/2014, 10/04/2017   Pneumococcal Polysaccharide-23 10/27/2017   Pneumococcal-Unspecified 02/20/2008   Td 06/06/2008   Tdap 11/21/2012, 11/02/2018   Health Maintenance Due  Topic Date Due   Diabetic kidney evaluation - Urine ACR  01/02/2010      Past Medical History:  Diagnosis Date   Allergy    Anxiety    PTSD   Aortic stenosis, moderate 03/27/2015   post AVR echo Echo 11/16: Mild LVH, EF 55-60%, normal wall motion, grade 1 diastolic dysfunction, bioprosthetic AVR okay (mean gradient 18 mmHg) with trivial AI, mild LAE, atrial septal aneurysm, small effusion    Arthritis    qwhere (10/29/2015)   Cancer (HCC)    skin cancer   Carotid artery stenosis    Carotid US  11/17: 1-39% bilateral ICA stenosis. F/u  prn   Cervical stenosis of spinal canal    fusion 2006   CHF (congestive heart failure) (HCC)    secondry to [facility]   Closed left pilon fracture    COLONIC POLYPS, HX OF    Complication of anesthesia    takes a lot to put him under and has woken up during surgery Difficult to wake up . PTSD; does not metabolize RX wellthat he gets violent, per pt.    Coronary artery disease    Depression    Difficult intubation 03/30/2015   Glidescop and 4 used 10/28/22, 04/07/23 due to history   DIVERTICULOSIS, COLON    Dyspnea    Dysrhythmia    2 bouts of afib post op and at home but corrected with medication.   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 Cindie   PTSD (post-traumatic stress disorder)    PTSD (post-traumatic stress disorder)    Sleep apnea    On a CPAP   Past Surgical History:  Procedure Laterality Date   ANKLE FUSION Left 03/17/2017   Procedure: FUSION  PILON FRACTURE WITH BONE GRAFTING;  Surgeon: Kendal Franky SQUIBB, MD;  Location: [facility] OR;  Service: Orthopedics;  Laterality: Left;   ANTERIOR FUSION CERVICAL SPINE  2002   AORTIC VALVE REPLACEMENT N/A 03/30/2015   Procedure: AORTIC VALVE REPLACEMENT (AVR);  Surgeon: Dallas KATHEE Jude, MD;  Location: [facility] OR;  Service: Open Heart  Surgery;  Laterality: N/A;   ATRIAL FIBRILLATION ABLATION N/A 04/07/2023   Procedure: ATRIAL FIBRILLATION ABLATION;  Surgeon: Cindie Ole DASEN, MD;  Location: [facility] INVASIVE CV LAB;  Service: Cardiovascular;  Laterality: N/A;   CARDIAC CATHETERIZATION N/A 03/26/2015   Procedure: Left Heart Cath and Coronary Angiography;  Surgeon: Alm LELON Clay, MD;  Location: [facility] INVASIVE CV LAB;  Service: Cardiovascular;  Laterality: N/A;   CARDIAC VALVE REPLACEMENT     CHOLECYSTECTOMY N/A 10/28/2022   Procedure: LAPAROSCOPIC CHOLECYSTECTOMY WITH INTRAOPERATIVE CHOLANGIOGRAM;  Surgeon: Gladis Cough, MD;  Location: [facility] ORS;  Service: General;  Laterality: N/A;   COLONOSCOPY W/ POLYPECTOMY     CORONARY ARTERY BYPASS GRAFT N/A 03/30/2015   Procedure: CORONARY ARTERY BYPASS GRAFTING (CABG) x four, using left internal mammary artery and left    leg greater saphenous vein harvested endoscopically ;  Surgeon: Dallas KATHEE Jude, MD;  Location: [facility] OR;  Service: Open Heart Surgery;  Laterality: N/A;   CORONARY/GRAFT ANGIOGRAPHY N/A 12/03/2021   Procedure: CORONARY/GRAFT ANGIOGRAPHY;  Surgeon: Jordan, [name], MD;  Location: [facility] INVASIVE CV LAB;  Service: Cardiovascular;  Laterality: N/A;   CYSTOSCOPY/URETEROSCOPY/HOLMIUM LASER/STENT PLACEMENT Bilateral 11/07/2016   Procedure: CYSTOSCOPY/URETEROSCOPY/HOLMIUM LASER/STENT PLACEMENT;  Surgeon: Chauncey Redell Agent, MD;  Location: [facility] ORS;  Service: Urology;  Laterality: Bilateral;   CYSTOSCOPY/URETEROSCOPY/HOLMIUM LASER/STENT PLACEMENT Bilateral 11/23/2016   Procedure: CYSTOSCOPY/BILATERAL URETEROSCOPY/HOLMIUM LASER/STENT EXCHANGE;  Surgeon: Chauncey Redell Agent, MD;  Location: [facility] ORS;  Service: Urology;  Laterality: Bilateral;  NEEDS 90 MIN    EXTERNAL FIXATION LEG Left 02/22/2017   Procedure: EXTERNAL FIXATION LEFT ANKLE;  Surgeon: Beverley Evalene BIRCH, MD;  Location: [facility] OR;  Service: Orthopedics;  Laterality: Left;   HEMORRHOID BANDING  1970s   INGUINAL HERNIA REPAIR Left 1962    KIDNEY STONE SURGERY  1986   cut me open   LEFT ATRIAL APPENDAGE OCCLUSION N/A 07/13/2023   Procedure: LEFT ATRIAL APPENDAGE OCCLUSION;  Surgeon: Cindie Ole DASEN, MD;  Location: [facility] INVASIVE CV LAB;  Service: Cardiovascular;  Laterality: N/A;   LEFT HEART CATH AND CORS/GRAFTS ANGIOGRAPHY N/A 10/25/2018   Procedure: LEFT HEART CATH AND CORS/GRAFTS ANGIOGRAPHY;  Surgeon: Clay Alm LELON, MD;  Location: [facility] INVASIVE CV LAB;  Service: Cardiovascular;  Laterality: N/A;   LEFT HEART CATH AND CORS/GRAFTS ANGIOGRAPHY N/A 12/03/2021   Procedure: LEFT HEART CATH AND CORS/GRAFTS ANGIOGRAPHY;  Surgeon: Jordan, [name], MD;  Location[facility] INVASIVE CV LAB;  Service: Cardiovascular;  Laterality: N/A;   POSTERIOR FUSION CERVICAL SPINE  2006   TEE WITHOUT CARDIOVERSION N/A 03/30/2015  Procedure: TRANSESOPHAGEAL ECHOCARDIOGRAM (TEE);  Surgeon: Dallas KATHEE Jude, MD;  Location: [facility] OR;  Service: Open Heart Surgery;  Laterality: N/A;   TESTICLE TORSION REDUCTION Right 1960   TOE FUSION Left 1985 & 2004   great toe and removal   TONSILLECTOMY     TOTAL HIP ARTHROPLASTY Right 10/27/2015   Procedure: RIGHT TOTAL HIP ARTHROPLASTY ANTERIOR APPROACH and steroid injection right foot;  Surgeon: Lonni CINDERELLA Poli, MD;  Location: [facility] OR;  Service: Orthopedics;  Laterality: Right;   TOTAL HIP ARTHROPLASTY Left 08/13/2019   TOTAL HIP ARTHROPLASTY Left 08/13/2019   Procedure: LEFT TOTAL HIP ARTHROPLASTY ANTERIOR APPROACH;  Surgeon: Poli Lonni CINDERELLA, MD;  Location: [facility] OR;  Service: Orthopedics;  Laterality: Left;   TRANSESOPHAGEAL ECHOCARDIOGRAM (CATH LAB) N/A 07/13/2023   Procedure: TRANSESOPHAGEAL ECHOCARDIOGRAM;  Surgeon: Cindie Ole DASEN, MD;  Location: [facility] INVASIVE CV LAB;  Service: Cardiovascular;  Laterality: N/A;    reports that he has been smoking cigarettes. He started smoking about 68 years ago. He has a 34 pack-year smoking history. He has never used smokeless tobacco. He reports current alcohol use of  about 3.0 standard drinks of alcohol per week. He reports that he does not use drugs. family history includes Alcohol abuse in an other family member; Arthritis in an other family member; Diabetes in his mother and paternal grandfather; Drug abuse in his sister; Healthy in his brother, daughter, and son; Heart disease in his father, maternal grandfather, maternal grandmother, mother, and paternal grandfather; Hypertension in his father, maternal grandfather, maternal grandmother, mother, and paternal grandfather; Prostate cancer in his maternal uncle; Tremor in his sister. Allergies[1] Medications Ordered Prior to Encounter[2]      ROS:  All others reviewed and negative.  Objective        PE:  BP (!) 134/100 (BP Location: Left Arm, Patient Position: Sitting, Cuff Size: Normal)   Pulse 65   Temp 98.1 F (36.7 C) (Oral)   Ht 5' 8 (1.727 m)   Wt 195 lb (88.5 kg)   SpO2 98%   BMI 29.65 kg/m                 Constitutional: Pt appears in NAD               HENT: Head: NCAT.                Right Ear: External ear normal.                 Left Ear: External ear normal.                Eyes: . Pupils are equal, round, and reactive to light. Conjunctivae and EOM are normal               Nose: without d/c or deformity               Neck: Neck supple. Gross normal ROM               Cardiovascular: Normal rate and regular rhythm.                 Pulmonary/Chest: Effort normal and breath sounds without rales or wheezing.                Abd:  Soft, NT, ND, + BS, no organomegaly               Neurological: Pt is alert. At baseline orientation, motor grossly intact  Skin: Skin is warm. No rashes, no other new lesions, LE edema - none               Psychiatric: Pt behavior is normal without agitation   Micro: none  Cardiac tracings I have personally interpreted today:  none  Pertinent Radiological findings (summarize): none   Lab Results  Component Value Date   WBC 7.6 06/27/2024    HGB 15.8 06/27/2024   HCT 48.0 06/27/2024   PLT 190.0 06/27/2024   GLUCOSE 135 (H) 06/27/2024   CHOL 117 06/27/2024   TRIG 75.0 06/27/2024   HDL 55.30 06/27/2024   LDLDIRECT 77.0 06/28/2022   LDLCALC 47 06/27/2024   ALT 20 06/27/2024   AST 18 06/27/2024   NA 139 06/27/2024   K 4.0 06/27/2024   CL 104 06/27/2024   CREATININE 1.03 06/27/2024   BUN 17 06/27/2024   CO2 26 06/27/2024   TSH 4.03 06/27/2024   PSA 1.95 06/28/2022   INR 1.1 09/15/2022   HGBA1C 6.3 06/27/2024   MICROALBUR 0.3 01/02/2009   Assessment/Plan:  Troy Banks is a 84 y.o. White or Caucasian [1] male with  has a past medical history of Allergy, Anxiety, Aortic stenosis, moderate (03/27/2015), Arthritis, Cancer (HCC), Carotid artery stenosis, Cervical stenosis of spinal canal, CHF (congestive heart failure) (HCC), Closed left pilon fracture, COLONIC POLYPS, HX OF, Complication of anesthesia, Coronary artery disease, Depression, Difficult intubation (03/30/2015), DIVERTICULOSIS, COLON, Dyspnea, Dysrhythmia, Familial tremor (03/19/2014), Family history of adverse reaction to anesthesia, GERD (gastroesophageal reflux disease), GOUT, Headache, Heart murmur, High cholesterol, History of blood transfusion, History of kidney stones, HYPERTENSION, Hypothyroidism, Myocardial infarction (HCC), OSA on CPAP, Pneumonia, 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  Preventative health care Age and sex appropriate education and counseling updated with regular exercise and diet Referrals for preventative services - declines colonoscopy,  Immunizations addressed - for shingrix at pharmacy, declines flu shot and covid booster Smoking counseling  - none needed Evidence for depression or other mood disorder - none significant Most recent labs  reviewed. I have personally reviewed and have noted: 1) the patient's medical and social history 2) The patient's current medications and supplements 3) The patient's height, weight, and BMI have been recorded in the chart   Diet-controlled diabetes mellitus (HCC) Lab Results  Component Value Date   HGBA1C 6.3 06/27/2024   Stable, pt to continue current medical treatment semaglutide 1/2 cc weekly   Essential hypertension BP Readings from Last 3 Encounters:  06/27/24 (!) 134/100  02/19/24 (!) 150/70  01/18/24 (!) 144/84   Uncontrolled here but wife states controlled at home, pt to continue medical treatment norvasc  10 qd   GOUT Asympt, for f/u uric acid  Hyperlipidemia Lab Results  Component Value Date   LDLCALC 47 06/27/2024   Stable, pt to continue current statin crestor  5 mg qd   Hypothyroidism Lab Results  Component Value Date   TSH 4.03 06/27/2024   Stable, pt does not require med tx at this time   Vitamin D  deficiency Last vitamin D  Lab Results  Component Value Date   VD25OH 28.16 (L) 06/27/2024   Low, to start oral replacement  Followup: Return in about 6 months (around 12/25/2024).  Lynwood Rush, MD 06/27/2024 8:20 PM [facility]     [1]  Allergies Allergen  Reactions   Acyclovir And Related Other (See Comments)    Accumulates and causes stroke-like side-effects due to cytochrome P450 enzyme deficiency   Benzodiazepines Other (See Comments)    Patient states he gets stroke symptoms with benzo's.    Compazine [Prochlorperazine Maleate] Other (See Comments)    Accumulates and causes stroke-like side-effects due to cytochrome P450 enzyme deficiency   Coreg  [Carvedilol ] Other (See Comments)    Accumulates and causes stroke-like side-effects due to cytochrome P450 enzyme deficiency Patient poor metabolizer of CYP2D6 - Coreg  undergoes extensive hepatic (including 2D6)   Flomax   [Tamsulosin  Hcl] Other (See Comments)    Accumulates and causes stroke-like side-effects due to cytochrome P450 enzyme deficiency   Lisinopril Other (See Comments)    Hyperkalemia. 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   Mobic  [Meloxicam ] Other (See Comments)    Accumulates and causes stroke-like side-effects due to cytochrome P450 enzyme deficiency   Morphine  And Codeine Other (See Comments)    Per pt's wife morphine  also contraindicated due to p450 enzyme deficiency.     Ondansetron  Other (See Comments)    Accumulates and causes stroke-like side-effects due to cytochrome P450 enzyme deficiency    Oxycodone  Other (See Comments)    Accumulates and causes stroke-like side-effects due to cytochrome P450 enzyme deficiency   Promethazine  Other (See Comments)    Accumulates and causes stroke-like side-effects due to cytochrome P450 enzyme deficiency   Sertraline Hcl Other (See Comments)    Accumulates and causes stroke-like side-effects due to cytochrome P450 enzyme deficiency   Tamsulosin  Itching    tamsulosin    Tramadol Other (See Comments)    Accumulates and causes stroke-like side-effects due to cytochrome P450 enzyme deficiency   Atorvastatin  Other (See Comments)    MYALGIAS   Colchicine  Other (See Comments)    ataxia   Fentanyl  Other (See Comments)    Accumulates and cause stroke like symptoms   Hydrocodone  Other (See Comments)    Accumulates and causes stroke-like side-effects due to cytochrome P450 enzyme deficiency  [2]  Current Outpatient Medications on File Prior to Visit  Medication Sig Dispense Refill   allopurinol  (ZYLOPRIM ) 300 MG tablet TAKE 1 TABLET BY MOUTH ONCE DAILY 90 tablet 3   amLODipine  (NORVASC ) 10 MG tablet TAKE 1 TABLET BY MOUTH EVERY DAY 90 tablet 2   amoxicillin  (AMOXIL ) 500 MG tablet Take 4 tablets by mouth one hour prior to dental procedure (Patient taking differently: Take 500 mg by mouth as needed.  Take 4 tablets by mouth one hour prior to dental procedure) 4 tablet 1   Ascorbic Acid  (VITAMIN C ) 1000 MG tablet Take 1,000 mg by mouth daily.     aspirin  EC 81 MG tablet Take 81 mg by mouth every other day. Swallow whole.     Blood Glucose Monitoring Suppl (ONETOUCH VERIO FLEX SYSTEM) w/Device KIT      Carboxymethylcellulose Sodium (LUBRICANT EYE DROPS OP) Place 1 drop into both eyes 4 (four) times daily as needed (dry/irritated eyes.). Ocean fresh     Cholecalciferol  (VITAMIN D -3 PO) Take 2,000 Units by mouth every other day.     Cyanocobalamin  (VITAMIN B-12) 5000 MCG TBDP Take 5,000 mcg by mouth every other day.     diclofenac  Sodium (VOLTAREN ) 1 % GEL Apply 2 g topically.     diltiazem  (CARDIZEM  CD) 240 MG 24 hr capsule TAKE 1 CAPSULE BY MOUTH DAILY 90 capsule 3   Ferrous Sulfate  (SLOW RELEASE IRON) 50 MG TBCR Take 1 tab  by mouth once daily 30 tablet 11   furosemide  (LASIX ) 40 MG tablet Take 1 tablet (40 mg total) by mouth every other day. 45 tablet 1   gabapentin  (NEURONTIN ) 800 MG tablet TAKE 1 TABLET AT NIGHT PRN     Galcanezumab-gnlm 120 MG/ML SOAJ Inject into the skin. INJECT 120MG  (1 PEN) SUBCUTANEOUSLY monthly     GEMTESA 75 MG TABS Take 1 tablet by mouth daily.     isosorbide  mononitrate (IMDUR ) 60 MG 24 hr tablet Take 1 tablet (60 mg total) by mouth daily. 90 tablet 3   Lancets (ONETOUCH DELICA PLUS LANCET30G) MISC check blood glucose DAILY     nitroGLYCERIN  (NITROSTAT ) 0.4 MG SL tablet PLACE 1 TABLET UNDER THE TONGUE EVERY 5 MINUTES AS NEEDED FOR CHEST PAIN. 25 tablet 3   NON FORMULARY Pt uses a cpap nightly     ONETOUCH VERIO test strip      primidone  (MYSOLINE ) 50 MG tablet Take 1 tablet (50 mg total) by mouth 2 (two) times daily. 180 tablet 2   Rimegepant Sulfate (NURTEC PO) Take by mouth daily as needed.     rosuvastatin  (CRESTOR ) 10 MG tablet TAKE 1/2 TABLET BY MOUTH EVERY DAY 45 tablet 3   Semaglutide,0.25 or 0.5MG /DOS, (OZEMPIC, 0.25 OR 0.5 MG/DOSE,) 2 MG/3ML SOPN Inject  0.5 mLs as directed once a week.     terbinafine (LAMISIL) 250 MG tablet Take 250 mg by mouth once a week. Patient taking every other week     tiZANidine  (ZANAFLEX ) 4 MG tablet Take 4 mg by mouth daily as needed for muscle spasms.     Turmeric 500 MG CAPS Take 500 mg by mouth daily.      [DISCONTINUED] ranitidine  (ZANTAC ) 150 MG tablet TAKE 1 TABLET BY MOUTH 2 TIMES DAILY (Patient not taking: Reported on 08/30/2023) 180 tablet 0   No current facility-administered medications on file prior to visit.

## 2024-06-27 NOTE — Assessment & Plan Note (Addendum)
 Lab Results  Component Value Date   HGBA1C 6.3 06/27/2024   Stable, pt to continue current medical treatment semaglutide 1/2 cc weekly

## 2024-06-27 NOTE — Assessment & Plan Note (Signed)
 Asympt, for f/u uric acid

## 2024-07-04 ENCOUNTER — Ambulatory Visit: Admitting: Podiatry

## 2024-07-10 NOTE — Progress Notes (Unsigned)
 "   Assessment/Plan:    1.  Essential Tremor  -Status post focused ultrasound to the right VIM at [facility] in January, 2022. I do think he is more unbalanced than he was prior to the surgery, and would be very nervous about him attempting to do the other side.  -***  primidone  50 mg twice per day.  Titration schedule given.  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  Chronic migraine  - Patient following with [facility] headache clinic, Dr. Deretha  -receiving cervical injections with triangle pain, as believes that there is a component of cervicogenic headache.  3.  Chronic LBP as well as left shoulder pain  - Has previously followed with Dr. Vernetta and had injections with Dr. Eldonna  4.  History of paroxysmal A-fib  - Status post Watchman device February, 2025.  No longer on anticoagulation  5.  Anemia, iron deficiency  - He is receiving IV iron  Subjective:   Troy Banks was seen today in follow up for follow-up chronic migraine, status post Botox  on Oct 16, 2020.  Pt with sig other, who supplements hx. Pt last seen 2 years ago.  Patient is status post focused ultrasound at [facility] in January, 2022.  At my last visit, we restarted primidone , 50 mg twice per day for tremor in the right hand.  Current prescribed movement disorder medications: Primidone , 50 mg twice per day  Gabapentin , 300 mg at bedtime   PREVIOUS MEDICATIONS: Not beta-blocker candidate because of bradycardia; not topiramate candidate because of history of nephrolithiasis; primidone ; botox ; gabapentin ; vyepti ; Nurtec; Emgality; Aimovig  ALLERGIES:   Allergies  Allergen Reactions   Acyclovir And Related Other (See Comments)    Accumulates and causes stroke-like side-effects due to cytochrome P450 enzyme deficiency   Benzodiazepines Other (See Comments)    Patient states he gets stroke symptoms with benzo's.    Compazine [Prochlorperazine Maleate] Other (See Comments)     Accumulates and causes stroke-like side-effects due to cytochrome P450 enzyme deficiency   Coreg  [Carvedilol ] Other (See Comments)    Accumulates and causes stroke-like side-effects due to cytochrome P450 enzyme deficiency Patient poor metabolizer of CYP2D6 - Coreg  undergoes extensive hepatic (including 2D6)   Flomax  [Tamsulosin  Hcl] Other (See Comments)    Accumulates and causes stroke-like side-effects due to cytochrome P450 enzyme deficiency   Lisinopril Other (See Comments)    Hyperkalemia. 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   Mobic  [Meloxicam ] Other (See Comments)    Accumulates and causes stroke-like side-effects due to cytochrome P450 enzyme deficiency   Morphine  And Codeine Other (See Comments)    Per pt's wife morphine  also contraindicated due to p450 enzyme deficiency.     Ondansetron  Other (See Comments)    Accumulates and causes stroke-like side-effects due to cytochrome P450 enzyme deficiency    Oxycodone  Other (See Comments)    Accumulates and causes stroke-like side-effects due to cytochrome P450 enzyme deficiency   Promethazine  Other (See Comments)    Accumulates and causes stroke-like side-effects due to cytochrome P450 enzyme deficiency   Sertraline Hcl Other (See Comments)    Accumulates and causes stroke-like side-effects due to cytochrome P450 enzyme deficiency   Tamsulosin  Itching    tamsulosin    Tramadol Other (See Comments)    Accumulates and causes stroke-like side-effects due to cytochrome P450 enzyme deficiency   Atorvastatin  Other (See Comments)    MYALGIAS   Colchicine  Other (See  Comments)    ataxia   Fentanyl  Other (See Comments)    Accumulates and cause stroke like symptoms   Hydrocodone  Other (See Comments)    Accumulates and causes stroke-like side-effects due to cytochrome P450 enzyme deficiency    CURRENT MEDICATIONS:  Outpatient Encounter Medications as of 07/12/2024  Medication Sig    allopurinol  (ZYLOPRIM ) 300 MG tablet TAKE 1 TABLET BY MOUTH ONCE DAILY   amLODipine  (NORVASC ) 10 MG tablet TAKE 1 TABLET BY MOUTH EVERY DAY   amoxicillin  (AMOXIL ) 500 MG tablet Take 4 tablets by mouth one hour prior to dental procedure (Patient taking differently: Take 500 mg by mouth as needed. Take 4 tablets by mouth one hour prior to dental procedure)   Ascorbic Acid  (VITAMIN C ) 1000 MG tablet Take 1,000 mg by mouth daily.   aspirin  EC 81 MG tablet Take 81 mg by mouth every other day. Swallow whole.   Blood Glucose Monitoring Suppl (ONETOUCH VERIO FLEX SYSTEM) w/Device KIT    Carboxymethylcellulose Sodium (LUBRICANT EYE DROPS OP) Place 1 drop into both eyes 4 (four) times daily as needed (dry/irritated eyes.). Ocean fresh   Cholecalciferol  (VITAMIN D -3 PO) Take 2,000 Units by mouth every other day.   Cyanocobalamin  (VITAMIN B-12) 5000 MCG TBDP Take 5,000 mcg by mouth every other day.   diclofenac  Sodium (VOLTAREN ) 1 % GEL Apply 2 g topically.   diltiazem  (CARDIZEM  CD) 240 MG 24 hr capsule TAKE 1 CAPSULE BY MOUTH DAILY   famotidine  (PEPCID ) 40 MG tablet Take 1 tablet (40 mg total) by mouth daily.   Ferrous Sulfate  (SLOW RELEASE IRON) 50 MG TBCR Take 1 tab by mouth once daily   furosemide  (LASIX ) 40 MG tablet Take 1 tablet (40 mg total) by mouth every other day.   gabapentin  (NEURONTIN ) 800 MG tablet TAKE 1 TABLET AT NIGHT PRN   Galcanezumab-gnlm 120 MG/ML SOAJ Inject into the skin. INJECT 120MG  (1 PEN) SUBCUTANEOUSLY monthly   GEMTESA 75 MG TABS Take 1 tablet by mouth daily.   isosorbide  mononitrate (IMDUR ) 60 MG 24 hr tablet Take 1 tablet (60 mg total) by mouth daily.   Lancets (ONETOUCH DELICA PLUS LANCET30G) MISC check blood glucose DAILY   nitroGLYCERIN  (NITROSTAT ) 0.4 MG SL tablet PLACE 1 TABLET UNDER THE TONGUE EVERY 5 MINUTES AS NEEDED FOR CHEST PAIN.   NON FORMULARY Pt uses a cpap nightly   ONETOUCH VERIO test strip    primidone  (MYSOLINE ) 50 MG tablet Take 1 tablet (50 mg total) by  mouth 2 (two) times daily.   Rimegepant Sulfate (NURTEC PO) Take by mouth daily as needed.   rosuvastatin  (CRESTOR ) 10 MG tablet TAKE 1/2 TABLET BY MOUTH EVERY DAY   Semaglutide,0.25 or 0.5MG /DOS, (OZEMPIC, 0.25 OR 0.5 MG/DOSE,) 2 MG/3ML SOPN Inject 0.5 mLs as directed once a week.   terbinafine (LAMISIL) 250 MG tablet Take 250 mg by mouth once a week. Patient taking every other week   tiZANidine  (ZANAFLEX ) 4 MG tablet Take 4 mg by mouth daily as needed for muscle spasms.   Turmeric 500 MG CAPS Take 500 mg by mouth daily.    [DISCONTINUED] ranitidine  (ZANTAC ) 150 MG tablet TAKE 1 TABLET BY MOUTH 2 TIMES DAILY (Patient not taking: Reported on 08/30/2023)   No facility-administered encounter medications on file as of 07/12/2024.     Objective:    PHYSICAL EXAMINATION:    VITALS:   There were no vitals filed for this visit.   GEN:  The patient appears stated age and is in NAD. HEENT:  Normocephalic, atraumatic.  The mucous membranes are moist.   Neurological examination:  Orientation: The patient is alert and oriented x3.  He is slow to respond and looks to wife for assist Cranial nerves: There is good facial symmetry. The speech is fluent and clear. Soft palate rises symmetrically and there is no tongue deviation. Hearing is intact to conversational tone. Sensation: Sensation is intact to light touch throughout Motor: Strength is at least antigravity x4.  Movement examination: Tone: There is normal tone in the UE/LE Abnormal movements: There is mild postural tremor on the right.  Very little tremor on the left.  He has trouble with Archimedes spirals, mostly with getting the pen on the paper on the right hand.  He is left-hand dominant. Coordination:  There is no decremation with RAM's,  Gait and Station: The patient is wide-based and minimally unbalanced.  He does have trouble getting out of the chair, but is able to do this.  He ambulates with his cane.   Chemistry      Component  Value Date/Time   NA 139 06/27/2024 1140   NA 144 10/09/2023 1205   K 4.0 06/27/2024 1140   CL 104 06/27/2024 1140   CO2 26 06/27/2024 1140   BUN 17 06/27/2024 1140   BUN 11 10/09/2023 1205   CREATININE 1.03 06/27/2024 1140   CREATININE 0.94 03/04/2016 0918      Component Value Date/Time   CALCIUM  9.8 06/27/2024 1140   ALKPHOS 58 06/27/2024 1140   AST 18 06/27/2024 1140   ALT 20 06/27/2024 1140   BILITOT 1.0 06/27/2024 1140   BILITOT 1.1 06/27/2023 1209      Lab Results  Component Value Date   WBC 7.6 06/27/2024   HGB 15.8 06/27/2024   HCT 48.0 06/27/2024   MCV 92.7 06/27/2024   PLT 190.0 06/27/2024   Lab Results  Component Value Date   TSH 4.03 06/27/2024     Chemistry      Component Value Date/Time   NA 139 06/27/2024 1140   NA 144 10/09/2023 1205   K 4.0 06/27/2024 1140   CL 104 06/27/2024 1140   CO2 26 06/27/2024 1140   BUN 17 06/27/2024 1140   BUN 11 10/09/2023 1205   CREATININE 1.03 06/27/2024 1140   CREATININE 0.94 03/04/2016 0918      Component Value Date/Time   CALCIUM  9.8 06/27/2024 1140   ALKPHOS 58 06/27/2024 1140   AST 18 06/27/2024 1140   ALT 20 06/27/2024 1140   BILITOT 1.0 06/27/2024 1140   BILITOT 1.1 06/27/2023 1209      Lab Results  Component Value Date   ESRSEDRATE 4 07/22/2020     Total time spent on today's visit was *** minutes, including both face-to-face time and nonface-to-face time.  Time included that spent on review of records (prior notes available to me/labs/imaging if pertinent), discussing treatment and goals, answering patient's questions and coordinating care.  Cc:  Norleen Lynwood ORN, MD "

## 2024-07-12 ENCOUNTER — Ambulatory Visit: Admitting: Neurology

## 2024-07-12 ENCOUNTER — Other Ambulatory Visit: Payer: Self-pay | Admitting: Cardiology

## 2024-07-12 VITALS — BP 128/82 | HR 72 | Wt 195.8 lb

## 2024-07-12 DIAGNOSIS — G250 Essential tremor: Secondary | ICD-10-CM

## 2024-07-12 DIAGNOSIS — R413 Other amnesia: Secondary | ICD-10-CM

## 2024-07-12 DIAGNOSIS — G43709 Chronic migraine without aura, not intractable, without status migrainosus: Secondary | ICD-10-CM

## 2024-07-12 MED ORDER — PRIMIDONE 50 MG PO TABS: ORAL_TABLET | ORAL | 2 refills | Status: AC

## 2024-07-12 NOTE — Patient Instructions (Signed)
 Increase primidone  to 50 mg, 2 in the AM, 1 in the evening  You have been referred for a neurocognitive evaluation (i.e., evaluation of memory and thinking abilities). Please bring someone with you to this appointment if possible, as it is helpful for the neuropsychologist to hear from both you and another adult who knows you well. Please bring eyeglasses and hearing aids if you wear them and take any medications as you normally would. Please fully abstain from all alcohol, marijuana, or other substances prior to your appointment.   The evaluation will take approximately 2-3 hours and has two parts:   The first part is a clinical interview with the neuropsychologist, Dr. Richie or Dr. Gayland. During the interview, the neuropsychologist will speak with you and the individual you brought to the appointment.    The second part of the evaluation is testing with the doctor's technician, aka psychometrician, Dana or [employer]. During the testing, the technician will ask you to remember different types of material, solve problems, and answer some questionnaires. Your family member will not be present for this portion of the evaluation.   Please note: We have to reserve several hours of the neuropsychologist's time and the psychometrician's time for your evaluation appointment. As such, there is a No-Show fee of $100. If you are unable to attend any of your appointments, please contact our office as soon as possible to reschedule.

## 2024-07-23 ENCOUNTER — Institutional Professional Consult (permissible substitution): Payer: Self-pay | Admitting: Psychology

## 2024-07-23 ENCOUNTER — Ambulatory Visit: Payer: Self-pay

## 2024-07-24 ENCOUNTER — Ambulatory Visit: Admitting: Cardiology

## 2024-08-05 ENCOUNTER — Ambulatory Visit: Admitting: Neurology

## 2024-08-08 ENCOUNTER — Encounter: Payer: Self-pay | Admitting: Psychology

## 2024-10-09 ENCOUNTER — Ambulatory Visit

## 2024-12-18 ENCOUNTER — Ambulatory Visit: Admitting: Family Medicine

## 2025-01-14 ENCOUNTER — Ambulatory Visit: Payer: Self-pay | Admitting: Neurology
